# Patient Record
Sex: Female | Born: 1955 | Race: White | Hispanic: No | Marital: Married | Smoking: Never smoker
Health system: Southern US, Community
[De-identification: ages and names within clinical notes are randomized; demographics above are authoritative.]

## PROBLEM LIST (undated history)

## (undated) DIAGNOSIS — D649 Anemia, unspecified: Secondary | ICD-10-CM

## (undated) DIAGNOSIS — D696 Thrombocytopenia, unspecified: Secondary | ICD-10-CM

## (undated) DIAGNOSIS — R000 Tachycardia, unspecified: Secondary | ICD-10-CM

## (undated) DIAGNOSIS — G40909 Epilepsy, unspecified, not intractable, without status epilepticus: Secondary | ICD-10-CM

## (undated) DIAGNOSIS — E785 Hyperlipidemia, unspecified: Secondary | ICD-10-CM

## (undated) DIAGNOSIS — R296 Repeated falls: Secondary | ICD-10-CM

## (undated) DIAGNOSIS — R2681 Unsteadiness on feet: Secondary | ICD-10-CM

## (undated) DIAGNOSIS — R4189 Other symptoms and signs involving cognitive functions and awareness: Secondary | ICD-10-CM

## (undated) DIAGNOSIS — N809 Endometriosis, unspecified: Secondary | ICD-10-CM

## (undated) DIAGNOSIS — I10 Essential (primary) hypertension: Secondary | ICD-10-CM

## (undated) DIAGNOSIS — D219 Benign neoplasm of connective and other soft tissue, unspecified: Secondary | ICD-10-CM

## (undated) DIAGNOSIS — N946 Dysmenorrhea, unspecified: Secondary | ICD-10-CM

## (undated) DIAGNOSIS — F419 Anxiety disorder, unspecified: Secondary | ICD-10-CM

## (undated) DIAGNOSIS — Z789 Other specified health status: Secondary | ICD-10-CM

## (undated) DIAGNOSIS — M810 Age-related osteoporosis without current pathological fracture: Secondary | ICD-10-CM

## (undated) DIAGNOSIS — F32A Depression, unspecified: Secondary | ICD-10-CM

## (undated) DIAGNOSIS — F0150 Vascular dementia without behavioral disturbance: Secondary | ICD-10-CM

## (undated) DIAGNOSIS — R3129 Other microscopic hematuria: Secondary | ICD-10-CM

## (undated) HISTORY — DX: Benign neoplasm of connective and other soft tissue, unspecified: D21.9

## (undated) HISTORY — DX: Essential (primary) hypertension: I10

## (undated) HISTORY — DX: Depression, unspecified: F32.A

## (undated) HISTORY — DX: Thrombocytopenia, unspecified: D69.6

## (undated) HISTORY — DX: Anxiety disorder, unspecified: F41.9

## (undated) HISTORY — DX: Age-related osteoporosis without current pathological fracture: M81.0

## (undated) HISTORY — PX: BREAST BIOPSY: SHX20

## (undated) HISTORY — DX: Endometriosis, unspecified: N80.9

## (undated) HISTORY — DX: Dysmenorrhea, unspecified: N94.6

## (undated) HISTORY — DX: Anemia, unspecified: D64.9

## (undated) HISTORY — DX: Tachycardia, unspecified: R00.0

## (undated) HISTORY — DX: Other microscopic hematuria: R31.29

---

## 1997-05-19 ENCOUNTER — Other Ambulatory Visit: Admission: RE | Admit: 1997-05-19 | Discharge: 1997-05-19 | Payer: Self-pay | Admitting: *Deleted

## 1998-05-04 ENCOUNTER — Other Ambulatory Visit: Admission: RE | Admit: 1998-05-04 | Discharge: 1998-05-04 | Payer: Self-pay | Admitting: *Deleted

## 1999-05-17 ENCOUNTER — Other Ambulatory Visit: Admission: RE | Admit: 1999-05-17 | Discharge: 1999-05-17 | Payer: Self-pay | Admitting: *Deleted

## 1999-10-27 ENCOUNTER — Ambulatory Visit (HOSPITAL_COMMUNITY): Admission: RE | Admit: 1999-10-27 | Discharge: 1999-10-27 | Payer: Self-pay | Admitting: Gastroenterology

## 2000-05-16 ENCOUNTER — Other Ambulatory Visit: Admission: RE | Admit: 2000-05-16 | Discharge: 2000-05-16 | Payer: Self-pay | Admitting: *Deleted

## 2001-05-07 ENCOUNTER — Other Ambulatory Visit: Admission: RE | Admit: 2001-05-07 | Discharge: 2001-05-07 | Payer: Self-pay | Admitting: *Deleted

## 2002-05-13 ENCOUNTER — Other Ambulatory Visit: Admission: RE | Admit: 2002-05-13 | Discharge: 2002-05-13 | Payer: Self-pay | Admitting: *Deleted

## 2003-01-05 ENCOUNTER — Ambulatory Visit (HOSPITAL_BASED_OUTPATIENT_CLINIC_OR_DEPARTMENT_OTHER): Admission: RE | Admit: 2003-01-05 | Discharge: 2003-01-05 | Payer: Self-pay | Admitting: Obstetrics and Gynecology

## 2003-01-05 ENCOUNTER — Ambulatory Visit (HOSPITAL_COMMUNITY): Admission: RE | Admit: 2003-01-05 | Discharge: 2003-01-05 | Payer: Self-pay | Admitting: Obstetrics and Gynecology

## 2003-01-06 ENCOUNTER — Encounter (INDEPENDENT_AMBULATORY_CARE_PROVIDER_SITE_OTHER): Payer: Self-pay | Admitting: Specialist

## 2003-07-19 ENCOUNTER — Other Ambulatory Visit: Admission: RE | Admit: 2003-07-19 | Discharge: 2003-07-19 | Payer: Self-pay | Admitting: Obstetrics and Gynecology

## 2004-06-27 ENCOUNTER — Other Ambulatory Visit: Admission: RE | Admit: 2004-06-27 | Discharge: 2004-06-27 | Payer: Self-pay | Admitting: *Deleted

## 2005-03-26 ENCOUNTER — Ambulatory Visit (HOSPITAL_COMMUNITY): Admission: RE | Admit: 2005-03-26 | Discharge: 2005-03-26 | Payer: Self-pay | Admitting: Obstetrics and Gynecology

## 2005-04-16 ENCOUNTER — Encounter: Admission: RE | Admit: 2005-04-16 | Discharge: 2005-04-16 | Payer: Self-pay | Admitting: Obstetrics and Gynecology

## 2005-04-20 ENCOUNTER — Encounter (INDEPENDENT_AMBULATORY_CARE_PROVIDER_SITE_OTHER): Payer: Self-pay | Admitting: *Deleted

## 2005-04-20 ENCOUNTER — Encounter: Admission: RE | Admit: 2005-04-20 | Discharge: 2005-04-20 | Payer: Self-pay | Admitting: Obstetrics and Gynecology

## 2005-06-28 ENCOUNTER — Other Ambulatory Visit: Admission: RE | Admit: 2005-06-28 | Discharge: 2005-06-28 | Payer: Self-pay | Admitting: Obstetrics & Gynecology

## 2005-07-06 ENCOUNTER — Encounter: Admission: RE | Admit: 2005-07-06 | Discharge: 2005-07-06 | Payer: Self-pay | Admitting: Obstetrics & Gynecology

## 2005-08-16 ENCOUNTER — Encounter: Admission: RE | Admit: 2005-08-16 | Discharge: 2005-08-16 | Payer: Self-pay | Admitting: Internal Medicine

## 2005-08-16 IMAGING — CT CT CHEST WO/W CM
2 of 7 series · 15 of 36 positions shown, 19 images · IV contrast (75ML OMNI 300)
Comparison: none

CLINICAL DATA: Left sided chest pain.
CHEST CT WITHOUT AND WITH CONTRAST:
TECHNIQUE: Multidetector CT imaging of the chest was performed following the standard protocol before and during bolus administration of intravenous contrast.
Contrast:  75 cc of Omnipaque 300.

[Series 5: recon 3: chest w/ · axial · 0.62mm/px · z∈[-295,-28]mm · 14 of 241 slices shown, 18 images]
[im 14/241  mediastinal]
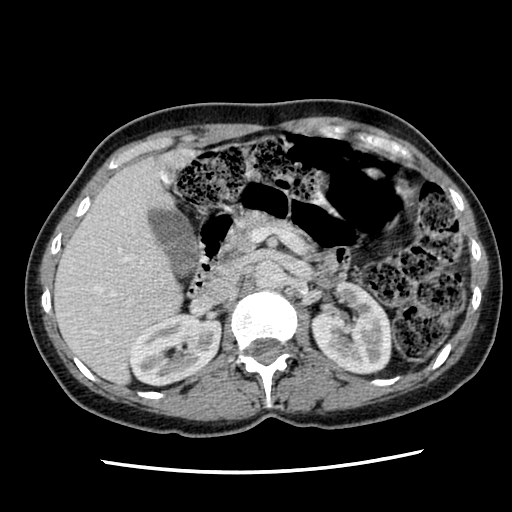
[im 14/241  lung]
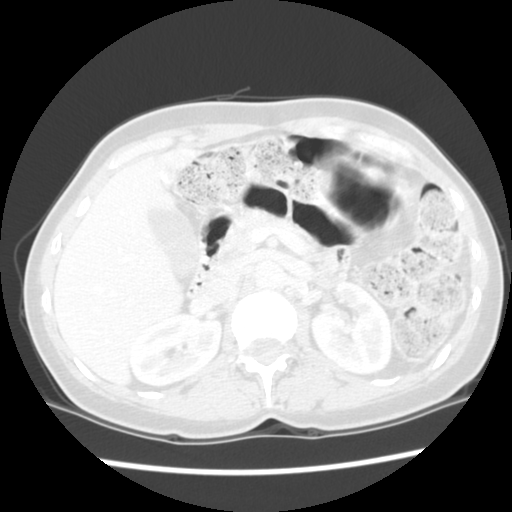
[im 27/241  lung]
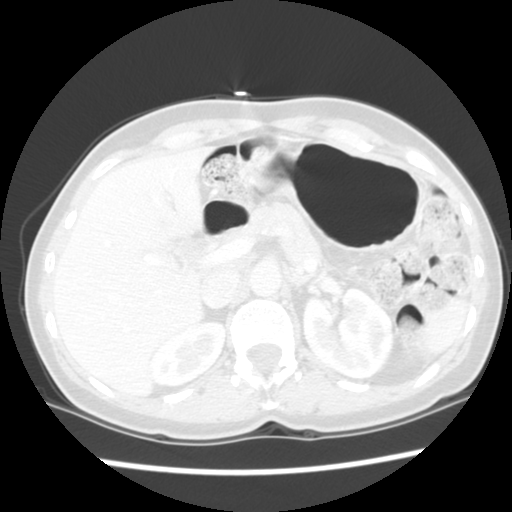
[im 54/241  lung]
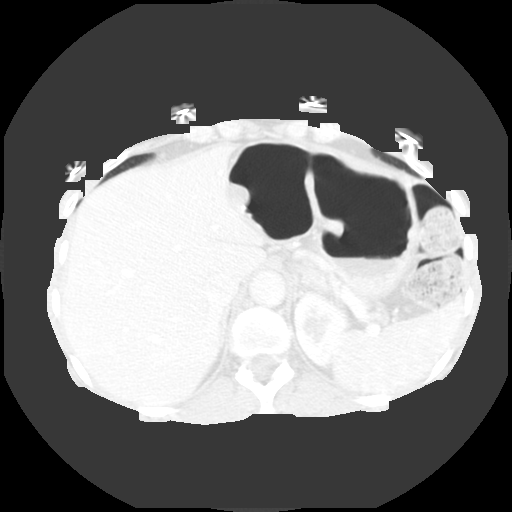
[im 67/241  lung]
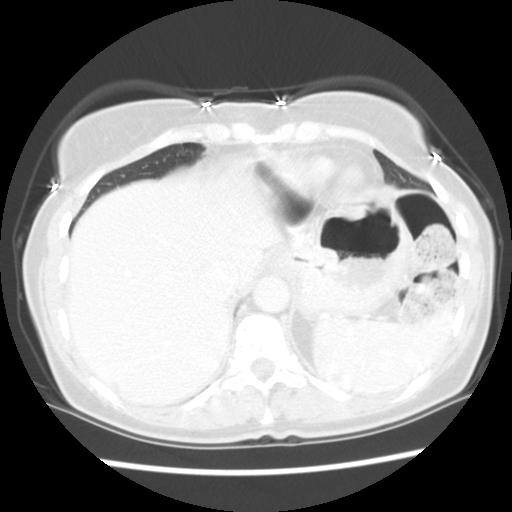
[im 81/241  mediastinal]
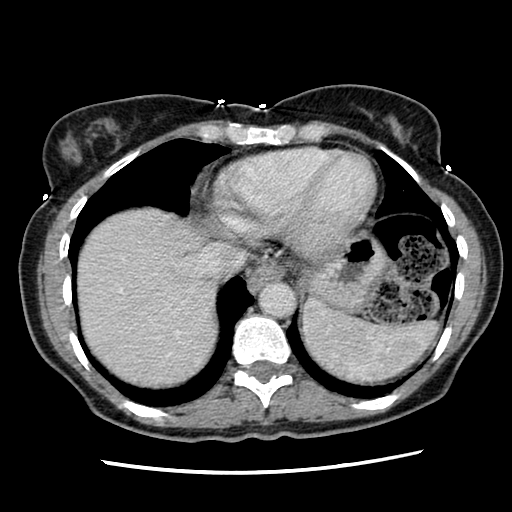
[im 81/241  lung]
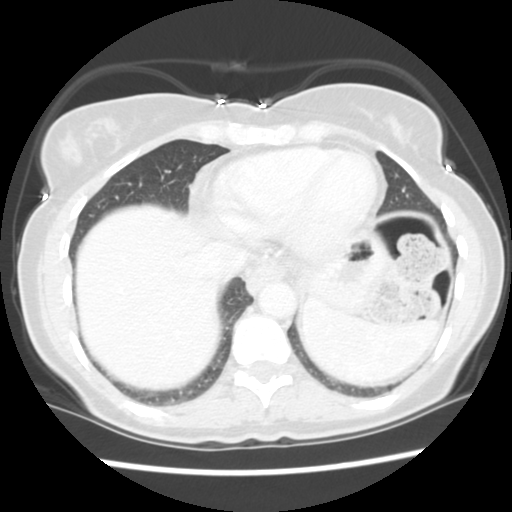
[im 94/241  lung]
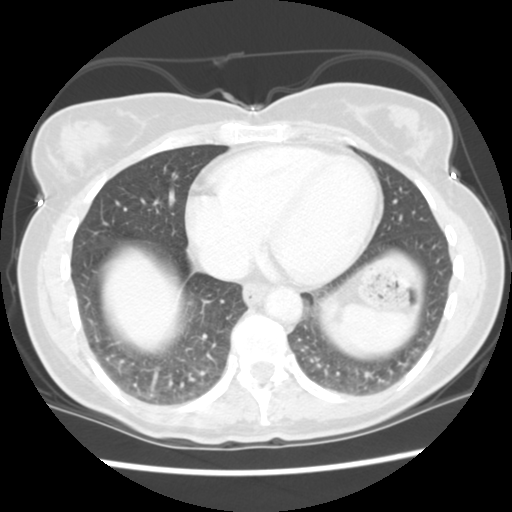
[im 107/241  lung]
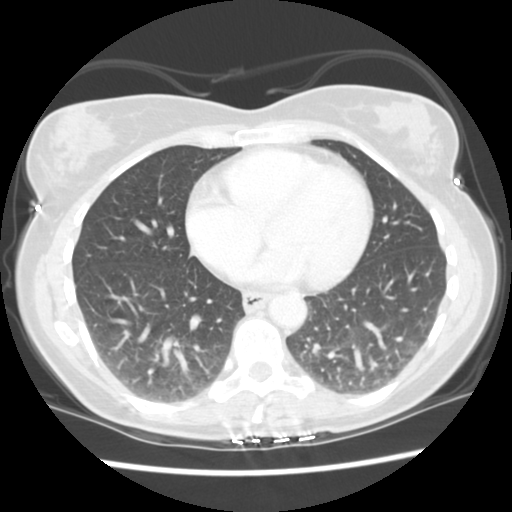
[im 134/241  lung]
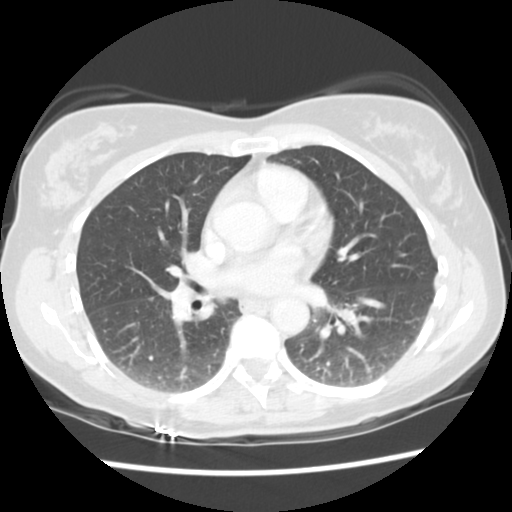
[im 147/241  mediastinal]
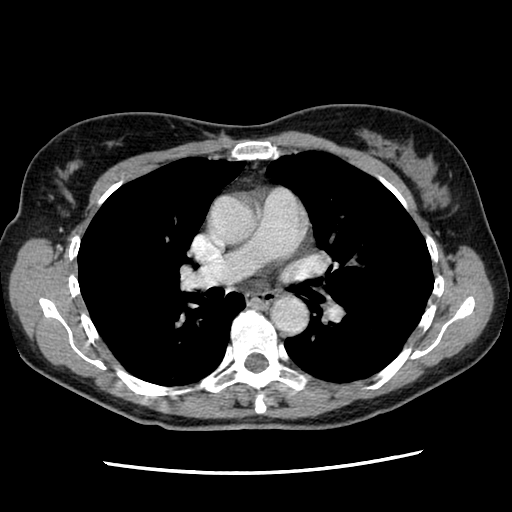
[im 147/241  lung]
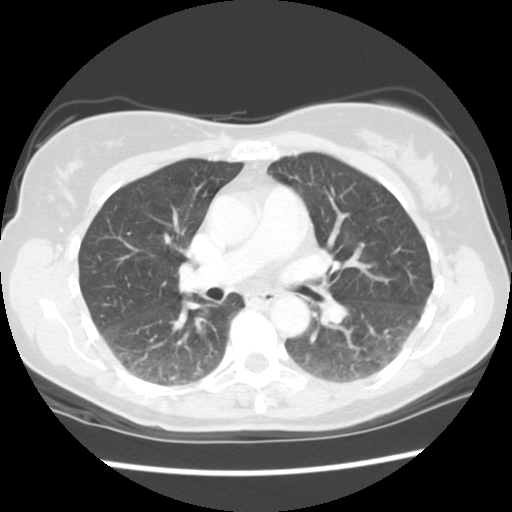
[im 161/241  lung]
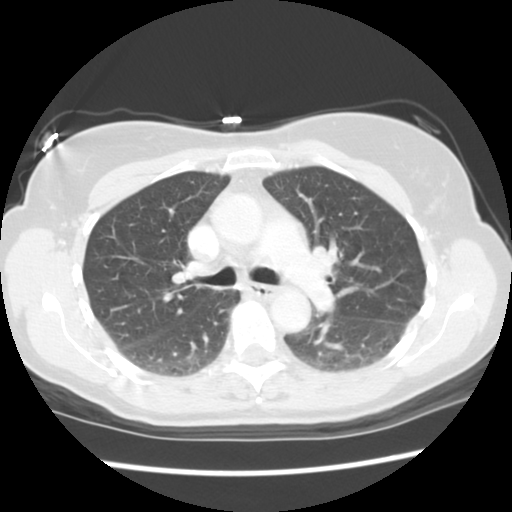
[im 174/241  lung]
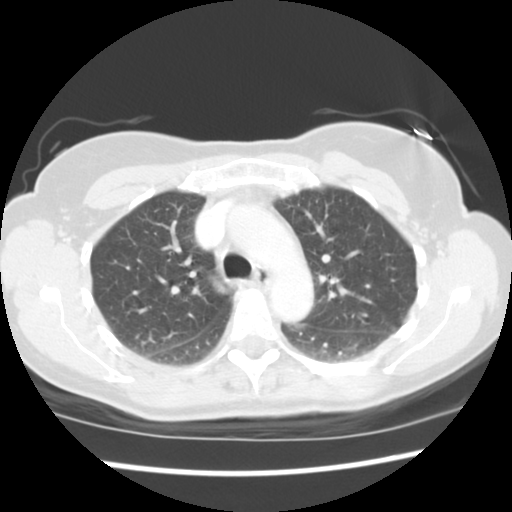
[im 187/241  lung]
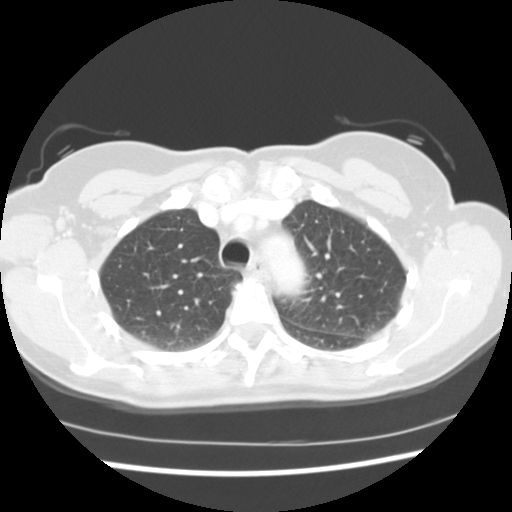
[im 214/241  mediastinal]
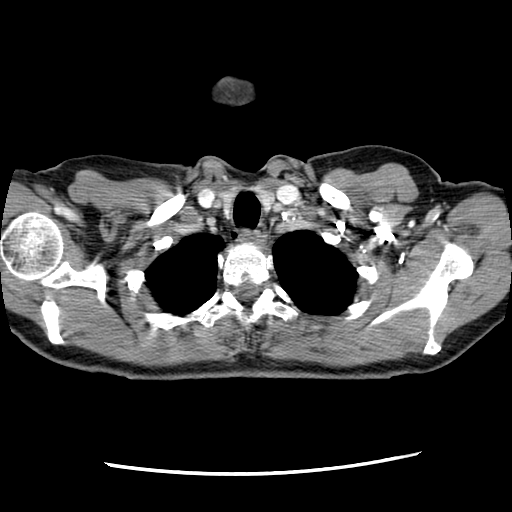
[im 214/241  lung]
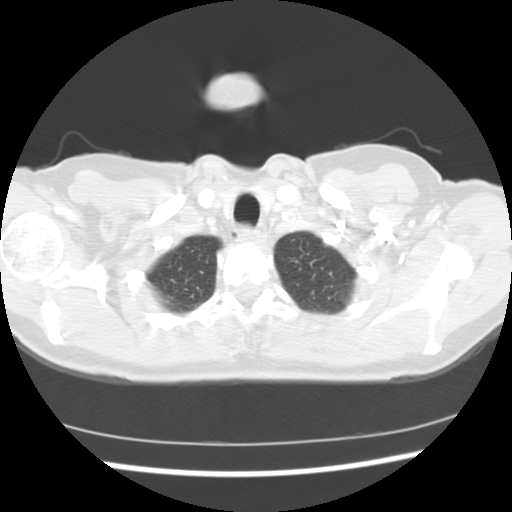
[im 227/241  lung]
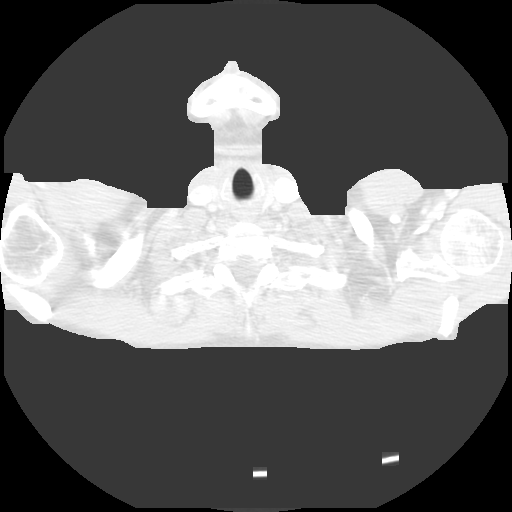

[Series 244: reformatted · coronal · 0.62mm/px · 1 of 90 slices shown]
[im 45/90  lung]
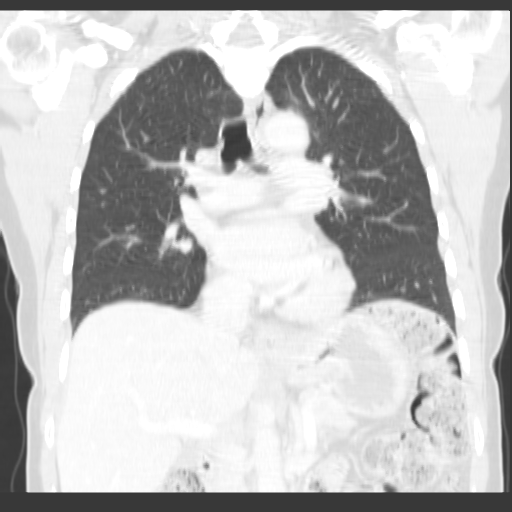

[15 of 36 positions shown; findings below may reference images not displayed]

FINDINGS: No mass is identified along the left chest wall.  No rib fracture is identified.  The imaged muscles appear normal.  The patient does not have a pleural or pericardial effusion.  There is no hilar, mediastinal, or axillary lymphadenopathy.  Heart and great vessels appear normal.  The lungs demonstrate some minimal dependent atelectatic change, but there is no pulmonary nodule, mass, or area of  consolidation.  
  Incidental imaging of the upper abdomen demonstrates a small lesion of the periphery of the right lobe of liver with peripheral nodular enhancement consistent with a hemangioma.  A second, too small to characterize, low attenuating lesion in the left lobe of the liver on image [DATE] represent a tiny hemangioma or possibly a cyst.  Upper abdomen is otherwise unremarkable.
IMPRESSION: 1.  No findings to explain the patient's pain.  No acute disease.
2.  Small hemangioma in the liver is noted.

## 2006-07-08 ENCOUNTER — Other Ambulatory Visit: Admission: RE | Admit: 2006-07-08 | Discharge: 2006-07-08 | Payer: Self-pay | Admitting: Obstetrics & Gynecology

## 2007-07-10 ENCOUNTER — Other Ambulatory Visit: Admission: RE | Admit: 2007-07-10 | Discharge: 2007-07-10 | Payer: Self-pay | Admitting: Obstetrics and Gynecology

## 2008-06-04 ENCOUNTER — Encounter: Admission: RE | Admit: 2008-06-04 | Discharge: 2008-06-04 | Payer: Self-pay | Admitting: Obstetrics & Gynecology

## 2010-01-22 ENCOUNTER — Encounter: Payer: Self-pay | Admitting: Obstetrics and Gynecology

## 2010-05-19 NOTE — Op Note (Signed)
NAMENYX, KEADY                         ACCOUNT NO.:  [id number]   MEDICAL RECORD NO.:  [id number]                   PATIENT TYPE:  AMB   LOCATION:  [facility]                                 FACILITY:  [facility]   PHYSICIAN:  Laqueta Linden, M.D.                 DATE OF BIRTH:  1955/07/22   DATE OF PROCEDURE:  01/05/2003  DATE OF DISCHARGE:                                 OPERATIVE REPORT   PREOPERATIVE DIAGNOSES:  1. Submucosal fibroid.  2. Menorrhagia.   POSTOPERATIVE DIAGNOSES:  1. Submucosal fibroid.  2. Menorrhagia.   PROCEDURE:  1. Hysteroscopic resection of the submucosal fibroid.  2. HTA.   SURGEON:  Laqueta Linden, M.D.   ANESTHESIA:  General LMA.   ESTIMATED BLOOD LOSS:  Less than 20 mL   SORBITOL NET INTAKE:  Less than 100 mL   SPECIMENS:  Sent to pathology.   COMPLICATIONS:  None.   INDICATIONS FOR PROCEDURE:  Jacqueline Bonilla is a 55 year old female who has  had significant and worsening menorrhagia over the past several years with  associated perimenopausal symptoms.  She is not a candidate for hormonal  management as she has had visual disturbances.  She underwent pelvic  ultrasound with sonohysterogram which confirmed the presence of a posterior  1.2 x 1.1 cm intracavitary fibroid as well as a 5 x 4 mm fundal fibroid.  She had a couple of other small intramural fibroids noted as well.  She had  endometrial sampling which revealed a benign endometrial polyp of the  hyperplastic variety but no malignancy.  It was felt that resection of the  small submucosal fibroid would be inadequate management and it was felt that  she would be an appropriate candidate for HTA.  She therefore received  preoperative Lupron treatment one month prior to surgery and presents now  for surgery. She has seen the operative hysteroscopy film, voiced her  understanding and acceptance of the risks, benefits, limitations,  alternatives, complications and possible incomplete or  nondefinitive relief  of her symptoms and agrees to proceed. Full consent was given.  She received  Toradol 30 mg IV on call to the OR and received another 30 mg IM  intraoperatively.   DESCRIPTION OF PROCEDURE:  The patient was taken to the operating room and  after proper identification and consents were ascertained, she was placed on  the operating table in the supine position.  After the injection of general  LMA, she was placed in the [place] stirrups and the perineum and vagina were  prepped and draped in a routine sterile fashion.  The bladder was then  emptied with a red rubber catheter.  The uterus was noted to be anterior,  parous in size, mobile.  The speculum was placed and the anterior lip of the  cervix grasped with a single tooth tenaculum.  The internal os was gently  dilated to a #21 Pratt dilator.  At this point, the HTA scope was inserted  into the cavity.  Both tubal ostia were visualized. The cavity appeared to  be atrophic consistent with the patient's Lupron.  However, the known  submucosal fibroid actually appeared to be more on the order of 2 cm and was  emanating from the posterior lower uterine segment and effectively blocked  access to the upper portion of the uterus.  It was felt that this needed to  be resected prior to HTA in order to allow for adequate circulation of the  fluid. For this reason, the HTA scope was withdrawn and the cervix was  dilated up to a #33 Pratt dilator. The resectoscope was then inserted.  Some  initial difficulty was encountered with resection with problems eventually  determined to be from the cautery machine. This was switched out and the  double loop was then used on routine settings to easily resect the fibroid.  All tissue pieces were evacuated. There was no bleeding noted whatsoever  again consistent with the patient's previous Lupron.  At this point, it was  felt that the endometrial cavity was amendable to the HTA procedure.   The  scope was then withdrawn.  The HTA scope was then reinserted and a  __________ tenaculum was placed circumferentially around the cervix with  what was felt to be an excellent seal.  This seal was tested and there was  no visual or reservoir evidence of any leakage of the fluid.  Once the field  was established then the HTA rule out commenced in a routine fashion with  the initial pressure track followed by the heating of the fluid followed by  a 10 minute ablation procedure followed by the cool down. At no time was  there any drop in the reservoir nor was there any visualized fluid leakage  into the upper vagina.  At the conclusion of the procedure, the scope was  then withdrawn.  The tenaculum was removed.  The cervix was somewhat raw in  appearance but there was no active bleeding noted.  The patient was stable  and awake on transfer to the PACU. She will be observed and discharged per  anesthesia protocol. She is to followup in the office in 2-3 weeks time or  sooner for excessive pain, fever, bleeding or other concerns. She was given  a prescription for Vicodin ES, dispensed 10, 1-2 q. 4-6h p.r.n. excessive  cramping with one refill. Also take to take Advil or Aleve as tolerated as  well as her routine medications.                                               Laqueta Linden, M.D.    LKS/MEDQ  D:  01/05/2003  T:  01/05/2003  Job:  [id number]

## 2010-05-19 NOTE — Procedures (Signed)
[facility]  Patient:    Jacqueline Bonilla, Jacqueline Bonilla                     MRN: [id number] Proc. Date: 10/27/99 Attending:  Anselmo Rod, M.D. CC:         Andres Ege, M.D.   Procedure Report  DATE OF BIRTH:  May 24, 1954  PROCEDURE PERFORMED:  Colonoscopy.  ENDOSCOPIST:  Anselmo Rod, M.D.  INSTRUMENT:  Olympus video colonoscope.  INDICATION FOR PROCEDURE:  Rectal bleeding in a 55 year old white female with a change in bowel habits, more constipation in the recent past.  Rule out colonic polyps, masses, hemorrhoids, etc.  PREPROCEDURE PREPARATION:  Informed consent was procured from the patient. The patient was fasting for eight hours prior to the procedure and prepped with a bottle of magnesium citrate and a gallon of NuLytely the night prior to the procedure.  PREPROCEDURE PHYSICAL:  VITAL SIGNS:  Stable.  NECK:  Supple.  CHEST:  Clear to auscultation.  HEART:  S1, S2 regular.  ABDOMEN:  Soft with normal abdominal bowel sounds.  DESCRIPTION OF THE PROCEDURE:  The patient was placed in the left lateral decubitus position and sedated with 50 mg of Demerol and 5 mg of Versed intravenously.  Once the patient was adequately sedated and maintained on low-flow oxygen and continuous cardiac monitoring, the Olympus video colonoscope was advanced from the rectum to the cecum without difficulty except for small, nonbleeding internal hemorrhoids.  No other abnormalities were seen, no masses, polyps, erosions, or diverticula were present.  IMPRESSION:  Normal colonoscopy up to the cecum except for small, nonbleeding internal hemorrhoid.  RECOMMENDATIONS: 1. The patient has been advised to increase fluid and fiber in her diet.  She    seems to note significant change when she increases fiber in her diet. 2. Outpatient followup is advised on a p.r.n. basis.  She is to contact the    office if she has any abnormal GI symptoms. DD:   10/27/99 TD:  10/27/99 Job: [id number]

## 2012-07-29 ENCOUNTER — Encounter: Payer: Self-pay | Admitting: Certified Nurse Midwife

## 2012-07-30 ENCOUNTER — Encounter: Payer: Self-pay | Admitting: Certified Nurse Midwife

## 2012-07-30 ENCOUNTER — Ambulatory Visit (INDEPENDENT_AMBULATORY_CARE_PROVIDER_SITE_OTHER): Payer: BC Managed Care – PPO | Admitting: Certified Nurse Midwife

## 2012-07-30 VITALS — BP 102/62 | HR 64 | Resp 16 | Ht 64.25 in | Wt 133.0 lb

## 2012-07-30 DIAGNOSIS — R5383 Other fatigue: Secondary | ICD-10-CM

## 2012-07-30 DIAGNOSIS — Z01419 Encounter for gynecological examination (general) (routine) without abnormal findings: Secondary | ICD-10-CM

## 2012-07-30 DIAGNOSIS — Z0000 Encounter for general adult medical examination without abnormal findings: Secondary | ICD-10-CM

## 2012-07-30 DIAGNOSIS — R5381 Other malaise: Secondary | ICD-10-CM

## 2012-07-30 DIAGNOSIS — E559 Vitamin D deficiency, unspecified: Secondary | ICD-10-CM

## 2012-07-30 LAB — LIPID PANEL: LDL Cholesterol: 103 mg/dL — ABNORMAL HIGH (ref 0–99)

## 2012-07-30 LAB — COMPREHENSIVE METABOLIC PANEL
ALT: 15 U/L (ref 0–35)
AST: 22 U/L (ref 0–37)
Albumin: 4.1 g/dL (ref 3.5–5.2)
BUN: 14 mg/dL (ref 6–23)
Calcium: 9.2 mg/dL (ref 8.4–10.5)
Chloride: 103 mEq/L (ref 96–112)
Creat: 0.77 mg/dL (ref 0.50–1.10)
Glucose, Bld: 82 mg/dL (ref 70–99)
Potassium: 3.6 mEq/L (ref 3.5–5.3)

## 2012-07-30 LAB — POCT URINALYSIS DIPSTICK
Ketones, UA: NEGATIVE
Nitrite, UA: NEGATIVE
Protein, UA: NEGATIVE
Urobilinogen, UA: NEGATIVE
pH, UA: 5

## 2012-07-30 LAB — CBC WITH DIFFERENTIAL/PLATELET
Basophils Relative: 1 % (ref 0–1)
HCT: 37.4 % (ref 36.0–46.0)
Hemoglobin: 12.7 g/dL (ref 12.0–15.0)
Lymphocytes Relative: 26 % (ref 12–46)
Lymphs Abs: 1.1 10*3/uL (ref 0.7–4.0)
Monocytes Absolute: 0.4 10*3/uL (ref 0.1–1.0)
Neutro Abs: 2.8 10*3/uL (ref 1.7–7.7)
WBC: 4.4 10*3/uL (ref 4.0–10.5)

## 2012-07-30 LAB — FERRITIN: Ferritin: 21 ng/mL (ref 10–291)

## 2012-07-30 LAB — VITAMIN B12: Vitamin B-12: 757 pg/mL (ref 211–911)

## 2012-07-30 LAB — IBC PANEL: TIBC: 358 ug/dL (ref 250–470)

## 2012-07-30 LAB — IRON: Iron: 82 ug/dL (ref 42–145)

## 2012-07-30 NOTE — Progress Notes (Signed)
57 y.o. G41P1001 Married Caucasian Fe here for annual exam. Menopausal no HRT, denies vaginal bleeding. Having vaginal dryness but not treating, not sexually active. Sees PCP for aex prn. Continues to have fatigue with essentially good diet. Sleeps OK. Has taken vacation this year!  Works long days. Feels fatigue is worse this year.  No other health issues. Declines depression feelings or anxiety.  Patient's last menstrual period was 01/02/2003.          Sexually active: no  The current method of family planning is vasectomy.    Exercising: yes  walking Smoker:  no  Health Maintenance: Pap: 07-25-11 neg HPV HR neg MMG:  2010 neg. Declines mammogram, "her choice" Colonoscopy:  2013 diverticuli, repeat 5 years BMD:   2010 Osteoporosis,  Declines scheduling TDaP:  07-25-11 Labs: Poct urine-neg, Hgb-12.1 Self breast exam: done monthly   reports that she has never smoked. She does not have any smokeless tobacco history on file. She reports that she does not drink alcohol or use illicit drugs.  Past Medical History  Diagnosis Date  . Endometriosis   . Dysmenorrhea   . Microhematuria     negative workup  . Osteoporosis   . Fibroid     Past Surgical History  Procedure Laterality Date  . Breast biopsy    . Hysteroscopic resection      Current Outpatient Prescriptions  Medication Sig Dispense Refill  . Ascorbic Acid (VITAMIN C PO) Take by mouth daily.      . B Complex Vitamins (B COMPLEX PO) Take by mouth daily.      . COD LIVER OIL PO Take by mouth as needed.       . Coenzyme Q10 (COQ10 PO) Take by mouth as needed.       Marland Kitchen MAGNESIUM PO Take by mouth as needed.       . Probiotic Product (PROBIOTIC PO) Take by mouth daily.      Marland Kitchen SARAFEM 10 MG TABS as needed.      . Vitamin D, Ergocalciferol, (DRISDOL) 50000 UNITS CAPS Take 50,000 Units by mouth every 14 (fourteen) days.       No current facility-administered medications for this visit.    History reviewed. No pertinent family  history.  ROS:  Pertinent items are noted in HPI.  Otherwise, a comprehensive ROS was negative.  Exam:   BP 102/62  Pulse 64  Resp 16  Ht 5' 4.25" (1.632 m)  Wt 133 lb (60.328 kg)  BMI 22.65 kg/m2  LMP 01/02/2003 Height: 5' 4.25" (163.2 cm)  Ht Readings from Last 3 Encounters:  07/30/12 5' 4.25" (1.632 m)    General appearance: alert, cooperative and appears stated age Head: Normocephalic, without obvious abnormality, atraumatic Neck: no adenopathy, supple, symmetrical, trachea midline and thyroid normal to inspection and palpation Lungs: clear to auscultation bilaterally Breasts: normal appearance, no masses or tenderness, No nipple retraction or dimpling, No nipple discharge or bleeding, No axillary or supraclavicular adenopathy Heart: regular rate and rhythm Abdomen: soft, non-tender; no masses,  no organomegaly Extremities: extremities normal, atraumatic, no cyanosis or edema Skin: Skin color, texture, turgor normal. No rashes or lesions Lymph nodes: Cervical, supraclavicular, and axillary nodes normal. No abnormal inguinal nodes palpated Neurologic: Grossly normal   Pelvic: External genitalia:  no lesions              Urethra:  normal appearing urethra with no masses, tenderness or lesions  Bartholin's and Skene's: normal                 Vagina: normal appearing vagina with normal color and discharge, no lesions              Cervix: normal, non tender              Pap taken: no Bimanual Exam:  Uterus:  normal size, contour, position, consistency, mobility, non-tender and retroverted              Adnexa: normal adnexa and no mass, fullness, tenderness               Rectovaginal: Confirms               Anus:  normal sphincter tone, no lesions  A:  Well Woman with normal exam  Menopausal no HRT  Fatigue increase  Osteoporosis self treatment with diet/exercise/calcium/Vitamin D  P:   Reviewed health and wellness pertinent to exam  Aware of need to evaluate,  if vaginal bleeding  Discussed sometimes depression/anxiety can lead to fatigue, but does not feel this is the case. Discussed time for self, and support from family.  Discussed evaluation with PCP if no change  Labs:Lipid panel, CMT, CBC with diff, IBC panel, Iron,Ferritin, TSH, VIt.B 12, Vit. D  Recommended repeat BMD, patient declines, would choose not use medications.  Pap smear as per guidelines   Mammogram yearly recommended, declines scheduling or having it done. Aware of benefits. pap smear not taken today  counseled on breast self exam, mammography screening, menopause, osteoporosis, adequate intake of calcium and vitamin D, diet and exercise  return annually or prn  An After Visit Summary was printed and given to the patient.

## 2012-07-30 NOTE — Patient Instructions (Addendum)

## 2012-07-31 LAB — VITAMIN D 25 HYDROXY (VIT D DEFICIENCY, FRACTURES): Vit D, 25-Hydroxy: 65 ng/mL (ref 30–89)

## 2012-07-31 NOTE — Progress Notes (Signed)
Note reviewed, agree with plan.  Chriss Mannan, MD  

## 2012-08-13 ENCOUNTER — Other Ambulatory Visit: Payer: Self-pay | Admitting: Certified Nurse Midwife

## 2012-08-13 NOTE — Telephone Encounter (Signed)
eScribe request for refill on SARAFEM Last filled - 07/31/11, #30 X 6  eScribe request for refill on VITAMIN D  Last filled - ? Vitamin D last checked on 07/30/12, normal. Last AEX - 07/30/12 Next AEX - 08/04/13   Please advise refills on Sarafem and Vitamin D.

## 2012-08-14 NOTE — Telephone Encounter (Signed)
Sarafem RX sent.  Vitamin D denied.  Message left for patient to return call.

## 2012-08-14 NOTE — Telephone Encounter (Signed)
Ok to refill Sarafem, but no refill on VIt D.due to normal She does not need Rx. She can take 1000IU daily OTC

## 2012-08-15 NOTE — Telephone Encounter (Signed)
I spoke with patient.  She is agreeable with OTC Vitamin D.  Also verified we sent in Sarafem tablets.

## 2012-08-15 NOTE — Telephone Encounter (Signed)
Patient is returning a call. She received a message yesterday asking her to call our office. She is not sure who called. She believes it may have to do with her prescription request. No outgoing phone call documented.

## 2012-08-18 ENCOUNTER — Telehealth: Payer: Self-pay | Admitting: Certified Nurse Midwife

## 2012-08-18 NOTE — Telephone Encounter (Signed)
LMTCB  aa 

## 2012-08-18 NOTE — Telephone Encounter (Signed)
Patient is wanting to have their BMI form filled out. Please call her. And let her know what she needs to do , fax e-mail. Drop off form.

## 2012-08-19 NOTE — Telephone Encounter (Signed)
Spoke with pt about BMI form. Pt has access to fax machine. Fax # given. Pt appreciative.

## 2012-11-06 ENCOUNTER — Other Ambulatory Visit: Payer: Self-pay

## 2013-08-04 ENCOUNTER — Ambulatory Visit (INDEPENDENT_AMBULATORY_CARE_PROVIDER_SITE_OTHER): Payer: BC Managed Care – PPO | Admitting: Certified Nurse Midwife

## 2013-08-04 ENCOUNTER — Encounter: Payer: Self-pay | Admitting: Certified Nurse Midwife

## 2013-08-04 VITALS — BP 126/78 | HR 60 | Resp 16 | Ht 64.0 in | Wt 136.4 lb

## 2013-08-04 DIAGNOSIS — Z01419 Encounter for gynecological examination (general) (routine) without abnormal findings: Secondary | ICD-10-CM

## 2013-08-04 DIAGNOSIS — Z124 Encounter for screening for malignant neoplasm of cervix: Secondary | ICD-10-CM

## 2013-08-04 DIAGNOSIS — Z0000 Encounter for general adult medical examination without abnormal findings: Secondary | ICD-10-CM

## 2013-08-04 LAB — POCT URINALYSIS DIPSTICK
Bilirubin, UA: NEGATIVE
Blood, UA: NEGATIVE
Glucose, UA: NEGATIVE
Ketones, UA: NEGATIVE
LEUKOCYTES UA: NEGATIVE
Nitrite, UA: NEGATIVE
PH UA: 5
Protein, UA: NEGATIVE
Urobilinogen, UA: NEGATIVE

## 2013-08-04 LAB — CBC
HEMATOCRIT: 37.9 % (ref 36.0–46.0)
HEMOGLOBIN: 12.8 g/dL (ref 12.0–15.0)
MCH: 30.2 pg (ref 26.0–34.0)
MCHC: 33.8 g/dL (ref 30.0–36.0)
MCV: 89.4 fL (ref 78.0–100.0)
Platelets: 116 10*3/uL — ABNORMAL LOW (ref 150–400)
RBC: 4.24 MIL/uL (ref 3.87–5.11)
RDW: 13.6 % (ref 11.5–15.5)
WBC: 5.7 10*3/uL (ref 4.0–10.5)

## 2013-08-04 LAB — HEMOGLOBIN, FINGERSTICK: HEMOGLOBIN, FINGERSTICK: 12.4 g/dL (ref 12.0–16.0)

## 2013-08-04 NOTE — Progress Notes (Signed)
58 y.o. G47P1001 Married Caucasian Fe here for annual exam. Menopausal no HRT. Patient denies vaginal bleeding or vaginal dryness. Not sexually active. "very stressful year since 02/08/22". Parents moved here from [place], Dad has Leukemia, but stable(both parents in 4's), sister contracted flu in [place]02[place]08 [place] and died 2/99 from complications of the flu. Son recently married and new grandson due in a month! "Not sure whether to laugh or cry". Patient has Sarafem she uses as needed and did use after sister's passing( she was the youngest of 87). Patient is sleeping better now. Not interested in grief counseling or support at this point. No thoughts of hurting self or others. "Just ready for the joy of a first grandchild". No health issues other than headaches, but not severe enough for any medication use. Aware eyes are changing and has scheduled eye exam. Sees PCP prn only. No other health issues today. Would like screening labs.  Patient's last menstrual period was 01/02/2003.          Sexually active: No.  The current method of family planning is vasectomy.    Exercising: Yes.    walk Smoker:  no  Health Maintenance: Pap: 07-25-11 neg HPV HR neg MMG:  2010 neg, pt declines mammogram ever Colonoscopy:  2013 diverticul, f/u 5 yrs BMD:   2010, declines scheduling TDaP:  07-25-11 Labs: HGB-12.4, URINE-negative Self breast exam:   reports that she has never smoked. She does not have any smokeless tobacco history on file. She reports that she does not drink alcohol or use illicit drugs.  Past Medical History  Diagnosis Date  . Endometriosis   . Dysmenorrhea   . Microhematuria     negative workup  . Osteoporosis   . Fibroid     Past Surgical History  Procedure Laterality Date  . Breast biopsy    . Hysteroscopic resection    . Cesarean section      Current Outpatient Prescriptions  Medication Sig Dispense Refill  . Ascorbic Acid (VITAMIN C PO) Take by mouth daily.      . B Complex  Vitamins (B COMPLEX PO) Take by mouth daily.      . COD LIVER OIL PO Take by mouth as needed.       . Coenzyme Q10 (COQ10 PO) Take by mouth as needed.       Marland Kitchen MAGNESIUM PO Take by mouth as needed.       . Probiotic Product (PROBIOTIC PO) Take by mouth daily.      Marland Kitchen SARAFEM 10 MG TABS take 1 tablet by mouth once daily  30 tablet  6   No current facility-administered medications for this visit.    Family History  Problem Relation Age of Onset  . Cancer Father     non hodgkin lymphoma & skin  . Heart attack Maternal Grandfather     ROS:  Pertinent items are noted in HPI.  Otherwise, a comprehensive ROS was negative.  Exam:   LMP 01/02/2003    Ht Readings from Last 3 Encounters:  07/30/12 5' 4.25" (1.632 m)    General appearance: alert, cooperative and appears stated age Head: Normocephalic, without obvious abnormality, atraumatic Neck: no adenopathy, supple, symmetrical, trachea midline and thyroid normal to inspection and palpation and non-palpable Lungs: clear to auscultation bilaterally Breasts: normal appearance, no masses or tenderness, No nipple retraction or dimpling, No nipple discharge or bleeding, No axillary or supraclavicular adenopathy Heart: regular rate and rhythm Abdomen: soft, non-tender; no masses,  no organomegaly Extremities: extremities normal, atraumatic, no cyanosis or edema Skin: Skin color, texture, turgor normal. No rashes or lesions Lymph nodes: Cervical, supraclavicular, and axillary nodes normal. No abnormal inguinal nodes palpated Neurologic: Grossly normal   Pelvic: External genitalia:  no lesions              Urethra:  normal appearing urethra with no masses, tenderness or lesions              Bartholin's and Skene's: normal                 Vagina: normal appearing vagina with normal color and discharge, no lesions              Cervix: atrophic appearance, bleeding with pap only              Pap taken: Yes.   Bimanual Exam:  Uterus:  normal  size, contour, position, consistency, mobility, non-tender and mid position              Adnexa: normal adnexa and no mass, fullness, tenderness               Rectovaginal: Confirms               Anus:  normal sphincter tone, no lesions  A:  Well Woman with normal exam  Menopausal no HRT  Social stress with sister's death and aging parents  Screening labs  P:   Reviewed health and wellness pertinent to exam  Aware of need to evaluate if vaginal bleeding  Discussed seeking church,family,friend support and reminded Hospice Grief counseling available. Will advise if headaches increase or seek evaluation for.  Pap smear taken today with HPV reflex   counseled on breast self exam, mammography screening encouraged and discussed 3 D use for evaluation if she only chooses to have one, adequate intake of calcium and vitamin D, diet and exercise  return annually or prn  An After Visit Summary was printed and given to the patient.

## 2013-08-04 NOTE — Patient Instructions (Signed)

## 2013-08-05 LAB — COMPREHENSIVE METABOLIC PANEL
ALT: 13 U/L (ref 0–35)
AST: 20 U/L (ref 0–37)
Albumin: 3.8 g/dL (ref 3.5–5.2)
Alkaline Phosphatase: 45 U/L (ref 39–117)
BUN: 14 mg/dL (ref 6–23)
CHLORIDE: 106 meq/L (ref 96–112)
CO2: 28 mEq/L (ref 19–32)
Calcium: 8.9 mg/dL (ref 8.4–10.5)
Creat: 0.74 mg/dL (ref 0.50–1.10)
GLUCOSE: 91 mg/dL (ref 70–99)
POTASSIUM: 4 meq/L (ref 3.5–5.3)
SODIUM: 141 meq/L (ref 135–145)
Total Bilirubin: 0.5 mg/dL (ref 0.2–1.2)
Total Protein: 6.5 g/dL (ref 6.0–8.3)

## 2013-08-05 LAB — LIPID PANEL
CHOLESTEROL: 176 mg/dL (ref 0–200)
HDL: 53 mg/dL (ref >39)
LDL CALC: 107 mg/dL — AB (ref 0–99)
TRIGLYCERIDES: 82 mg/dL (ref <150)
Total CHOL/HDL Ratio: 3.3 Ratio
VLDL: 16 mg/dL (ref 0–40)

## 2013-08-05 LAB — HEMOGLOBIN A1C
HEMOGLOBIN A1C: 5.9 % — AB (ref <5.7)
MEAN PLASMA GLUCOSE: 123 mg/dL — AB (ref <117)

## 2013-08-05 LAB — THYROID PANEL WITH TSH
Free Thyroxine Index: 2.5 (ref 1.0–3.9)
T3 UPTAKE: 36.4 % (ref 22.5–37.0)
T4 TOTAL: 7 ug/dL (ref 5.0–12.5)
TSH: 1.112 u[IU]/mL (ref 0.350–4.500)

## 2013-08-05 LAB — VITAMIN D 25 HYDROXY (VIT D DEFICIENCY, FRACTURES): VIT D 25 HYDROXY: 45 ng/mL (ref 30–89)

## 2013-08-05 NOTE — Progress Notes (Signed)
Reviewed personally.  M. Suzanne Ronnesha Mester, MD.  

## 2013-08-06 LAB — IPS PAP TEST WITH REFLEX TO HPV

## 2013-08-07 ENCOUNTER — Telehealth: Payer: Self-pay

## 2013-08-07 DIAGNOSIS — Z09 Encounter for follow-up examination after completed treatment for conditions other than malignant neoplasm: Secondary | ICD-10-CM

## 2013-08-07 NOTE — Telephone Encounter (Signed)
Spoke with patient. Results given as seen below. Patient is agreeable. Patient states that she does not have primary care but does not want a referral. "To be honest I don't think I want one. It is good to know a few things are off. I will have to think about it." Advised follow up is recommended. Patient is agreeable but states that she does not want follow up at this time. Advised would let Regina Eck CNM know and if she would like referral to call back and we would be happy to place it for her.   Routing to provider for final review. Patient agreeable to disposition. Will close encounter

## 2013-08-07 NOTE — Telephone Encounter (Signed)
Message copied by Jasmine Awe on Fri Aug 07, 2013  9:44 AM ------      Message from: Regina Eck      Created: Thu Aug 06, 2013 12:14 PM       Notify patient that here liver, kidney profile is normal      Lipid profile with cholesterol, triglycerides, HDL and LDL are optimal level      Thyroid panel and TSH are normal      Vitamin D good range at 45 but lower than before which was 65, start on 600 IU Vit.D 3 daily recheck at aex      CBC all normal except for platelets which are low at 116, normal 150-400      Hgb A1-c is elevated at 5.9, normal < 5.7 which indicate increase of diabetes      Patient needs to PCP management for Platelets and Hgb.A1-c. Does she have PCP or need referral.      Will copy of labs for referral or PCP.       ------

## 2013-08-07 NOTE — Telephone Encounter (Signed)
Spoke my patient. Advised patient of message from [place]. Patient agreeable. Lab appointment scheduled for next Friday 8/14 at 8:30am. Patient agreeable to date and time. Order for CBC placed.  Routing to provider for final review. Patient agreeable to disposition. Will close encounter

## 2013-08-07 NOTE — Addendum Note (Signed)
Addended by: Jasmine Awe on: 08/07/2013 02:45 PM   Modules accepted: Orders

## 2013-08-07 NOTE — Telephone Encounter (Signed)
Patient called back to cancel appointment and call was transferred to me as I had just scheduled it. Advised patient that she needs this appointment for follow up. Advised it is important for her to have redraw. Patient states that she does not want to keep appointment and would like to cancel it. Does not want to reschedule.  Routing to Regina Eck CNM as Juluis Rainier and review

## 2013-08-07 NOTE — Telephone Encounter (Signed)
If she is not going to see PCP , we need to recheck her platelet count. Have patient stop all supplements for one  Week and come in for recheck of CBC. Please schedule

## 2013-08-14 ENCOUNTER — Other Ambulatory Visit: Payer: BC Managed Care – PPO

## 2013-08-17 ENCOUNTER — Telehealth: Payer: Self-pay | Admitting: Certified Nurse Midwife

## 2013-08-17 NOTE — Telephone Encounter (Signed)
Routing to Debbi for Primary care recommendation.

## 2013-08-17 NOTE — Telephone Encounter (Signed)
Patient is asking for a recommendation for a PCP doctor from [place]. Patient says at her last visit [name] would recommend a doctor.

## 2013-08-17 NOTE — Telephone Encounter (Signed)
[facility], Dr. Cari Caraway

## 2013-08-17 NOTE — Telephone Encounter (Signed)
[place] @ [facility] [address] [place] [id number] Phone: [phone]   Patient notified and will call to make a primary care appointment.  Routing to provider for final review. Patient agreeable to disposition. Will close encounter

## 2013-11-02 ENCOUNTER — Encounter: Payer: Self-pay | Admitting: Certified Nurse Midwife

## 2013-11-23 ENCOUNTER — Other Ambulatory Visit: Payer: Self-pay | Admitting: Certified Nurse Midwife

## 2013-11-23 NOTE — Telephone Encounter (Signed)
Last refilled: 08/13/12 #30/6 rfs by Ms. [name] no rx was sent/given Last AEX: 08/04/13 with Ms. [name] AEX Scheduled: 08/10/14 with Ms. [name]  Please Advise.

## 2014-06-16 ENCOUNTER — Ambulatory Visit (INDEPENDENT_AMBULATORY_CARE_PROVIDER_SITE_OTHER): Payer: BLUE CROSS/BLUE SHIELD | Admitting: Emergency Medicine

## 2014-06-16 VITALS — BP 120/70 | HR 75 | Temp 98.0°F | Resp 16 | Ht 63.5 in | Wt 135.0 lb

## 2014-06-16 DIAGNOSIS — H8110 Benign paroxysmal vertigo, unspecified ear: Secondary | ICD-10-CM

## 2014-06-16 MED ORDER — MECLIZINE HCL 25 MG PO TABS: 25.0000 mg | ORAL_TABLET | Freq: Three times a day (TID) | ORAL | Status: DC | PRN

## 2014-06-16 NOTE — Progress Notes (Signed)
Subjective:  Patient ID: Jacqueline Bonilla, female    DOB: Oct 06, 1955  Age: 59 y.o. MRN: [id number]  CC: Dizziness   HPI Jacqueline Bonilla presents  with dizziness. She on Monday was at work and became very dizzy and required using the floor for support after sliding down the wall. This dizziness lasted short. This dizziness was not associated with any neurologic or visual symptoms. The dizziness she experienced was short-lived and she had no antecedent no was or injury. No headache.  Today when she got out of bed she felt immediate dizziness. Dizziness is affected by postural changes and changes in her position of her head. She has no headache no nausea or vomiting no other neurological visual symptoms. She's no fever or chills. No cough or coryza. No pain or pressure in ears. Her hearing is normal. And since leaving the house her dizziness improved somewhat.  she's had no improvement with over-the-counter medication.  History Jacqueline Bonilla has a past medical history of Endometriosis; Dysmenorrhea; Microhematuria; Osteoporosis; and Fibroid.   She has past surgical history that includes Breast biopsy; hysteroscopic resection; and Cesarean section.   Her  family history includes Cancer in her father; Heart attack in her maternal grandfather.  She   reports that she has never smoked. She has never used smokeless tobacco. She reports that she does not drink alcohol or use illicit drugs.  Outpatient Prescriptions Prior to Visit  Medication Sig Dispense Refill  . Ascorbic Acid (VITAMIN C PO) Take by mouth daily.    . B Complex Vitamins (B COMPLEX PO) Take by mouth daily.    . COD LIVER OIL PO Take by mouth as needed.     . Coenzyme Q10 (COQ10 PO) Take by mouth as needed.     Marland Kitchen MAGNESIUM PO Take by mouth as needed.     . Probiotic Product (PROBIOTIC PO) Take by mouth daily.    Marland Kitchen SARAFEM 10 MG TABS take 1 tablet by mouth once daily (Patient not taking: Reported on 06/16/2014) 30 tablet 6   No  facility-administered medications prior to visit.    History   Social History  . Marital Status: Married    Spouse Name: N/A  . Number of Children: N/A  . Years of Education: N/A   Social History Main Topics  . Smoking status: Never Smoker   . Smokeless tobacco: Never Used  . Alcohol Use: No  . Drug Use: No  . Sexual Activity: No     Comment: husband vasectomy   Other Topics Concern  . None   Social History Narrative     Review of Systems  Constitutional: Negative for fever, chills and appetite change.  HENT: Negative for congestion, ear pain, postnasal drip, sinus pressure and sore throat.   Eyes: Negative for pain and redness.  Respiratory: Negative for cough, shortness of breath and wheezing.   Cardiovascular: Negative for leg swelling.  Gastrointestinal: Negative for nausea, vomiting, abdominal pain, diarrhea, constipation and blood in stool.  Endocrine: Negative for polyuria.  Genitourinary: Negative for dysuria, urgency, frequency and flank pain.  Musculoskeletal: Negative for gait problem.  Skin: Negative for rash.  Neurological: Positive for dizziness. Negative for tremors, seizures, syncope, speech difficulty, weakness, light-headedness, numbness and headaches.  Psychiatric/Behavioral: Negative for confusion and decreased concentration. The patient is not nervous/anxious.     Objective:  BP 120/70 mmHg  Pulse 75  Temp(Src) 98 F (36.7 C) (Oral)  Resp 16  Ht 5' 3.5" (1.613 m)  Wt 135  lb (61.236 kg)  BMI 23.54 kg/m2  SpO2 98%  LMP 01/02/2003  Physical Exam  Constitutional: She is oriented to person, place, and time. She appears well-developed and well-nourished. No distress.  HENT:  Head: Normocephalic and atraumatic.  Right Ear: External ear normal.  Left Ear: External ear normal.  Nose: Nose normal.  Eyes: Conjunctivae and EOM are normal. Pupils are equal, round, and reactive to light. No scleral icterus.  Neck: Normal range of motion. Neck  supple. No tracheal deviation present.  Cardiovascular: Normal rate, regular rhythm and normal heart sounds.   Pulmonary/Chest: Effort normal. No respiratory distress. She has no wheezes. She has no rales.  Abdominal: She exhibits no mass. There is no tenderness. There is no rebound and no guarding.  Musculoskeletal: She exhibits no edema.  Lymphadenopathy:    She has no cervical adenopathy.  Neurological: She is alert and oriented to person, place, and time. Coordination normal.  Skin: Skin is warm and dry. No rash noted.  Psychiatric: She has a normal mood and affect. Her behavior is normal.      Assessment & Plan:   Jacqueline Bonilla was seen today for dizziness.  Diagnoses and all orders for this visit:  Vertigo, benign paroxysmal, unspecified laterality  Other orders -     meclizine (ANTIVERT) 25 MG tablet; Take 1 tablet (25 mg total) by mouth 3 (three) times daily as needed for dizziness.   I am having Jacqueline Bonilla start on meclizine. I am also having her maintain her Ascorbic Acid (VITAMIN C PO), Coenzyme Q10 (COQ10 PO), Probiotic Product (PROBIOTIC PO), MAGNESIUM PO, COD LIVER OIL PO, B Complex Vitamins (B COMPLEX PO), SARAFEM, and Cholecalciferol (VITAMIN D PO).  Meds ordered this encounter  Medications  . Cholecalciferol (VITAMIN D PO)    Sig: Take by mouth.  . meclizine (ANTIVERT) 25 MG tablet    Sig: Take 1 tablet (25 mg total) by mouth 3 (three) times daily as needed for dizziness.    Dispense:  30 tablet    Refill:  0    Appropriate red flag conditions were discussed with the patient as well as actions that should be taken.  Patient expressed his understanding.  Follow-up: Return if symptoms worsen or fail to improve.  Roselee Culver, MD

## 2014-06-16 NOTE — Patient Instructions (Signed)
Benign Positional Vertigo Vertigo means you feel like you or your surroundings are moving when they are not. Benign positional vertigo is the most common form of vertigo. Benign means that the cause of your condition is not serious. Benign positional vertigo is more common in older adults. CAUSES  Benign positional vertigo is the result of an upset in the labyrinth system. This is an area in the middle ear that helps control your balance. This may be caused by a viral infection, head injury, or repetitive motion. However, often no specific cause is found. SYMPTOMS  Symptoms of benign positional vertigo occur when you move your head or eyes in different directions. Some of the symptoms may include:  Loss of balance and falls.  Vomiting.  Blurred vision.  Dizziness.  Nausea.  Involuntary eye movements (nystagmus). DIAGNOSIS  Benign positional vertigo is usually diagnosed by physical exam. If the specific cause of your benign positional vertigo is unknown, your caregiver may perform imaging tests, such as magnetic resonance imaging (MRI) or computed tomography (CT). TREATMENT  Your caregiver may recommend movements or procedures to correct the benign positional vertigo. Medicines such as meclizine, benzodiazepines, and medicines for nausea may be used to treat your symptoms. In rare cases, if your symptoms are caused by certain conditions that affect the inner ear, you may need surgery. HOME CARE INSTRUCTIONS   Follow your caregiver's instructions.  Move slowly. Do not make sudden body or head movements.  Avoid driving.  Avoid operating heavy machinery.  Avoid performing any tasks that would be dangerous to you or others during a vertigo episode.  Drink enough fluids to keep your urine clear or pale yellow. SEEK IMMEDIATE MEDICAL CARE IF:   You develop problems with walking, weakness, numbness, or using your arms, hands, or legs.  You have difficulty speaking.  You develop  severe headaches.  Your nausea or vomiting continues or gets worse.  You develop visual changes.  Your family or friends notice any behavioral changes.  Your condition gets worse.  You have a fever.  You develop a stiff neck or sensitivity to light. MAKE SURE YOU:   Understand these instructions.  Will watch your condition.  Will get help right away if you are not doing well or get worse. Document Released: 09/25/2005 Document Revised: 03/12/2011 Document Reviewed: 09/07/2010 [employer] Patient Information 2015 [employer]. This information is not intended to replace advice given to you by your health care provider. Make sure you discuss any questions you have with your health care provider.    

## 2014-08-10 ENCOUNTER — Ambulatory Visit: Payer: BC Managed Care – PPO | Admitting: Certified Nurse Midwife

## 2014-08-10 ENCOUNTER — Encounter: Payer: Self-pay | Admitting: Certified Nurse Midwife

## 2015-07-11 ENCOUNTER — Ambulatory Visit (INDEPENDENT_AMBULATORY_CARE_PROVIDER_SITE_OTHER): Payer: BLUE CROSS/BLUE SHIELD | Admitting: Family Medicine

## 2015-07-11 VITALS — BP 110/80 | HR 74 | Temp 98.3°F | Resp 16 | Ht 63.0 in | Wt 130.0 lb

## 2015-07-11 DIAGNOSIS — R42 Dizziness and giddiness: Secondary | ICD-10-CM | POA: Diagnosis not present

## 2015-07-11 DIAGNOSIS — R51 Headache: Secondary | ICD-10-CM | POA: Diagnosis not present

## 2015-07-11 DIAGNOSIS — R519 Headache, unspecified: Secondary | ICD-10-CM

## 2015-07-11 DIAGNOSIS — Z8679 Personal history of other diseases of the circulatory system: Secondary | ICD-10-CM | POA: Diagnosis not present

## 2015-07-11 DIAGNOSIS — T675XXA Heat exhaustion, unspecified, initial encounter: Secondary | ICD-10-CM | POA: Diagnosis not present

## 2015-07-11 DIAGNOSIS — R7303 Prediabetes: Secondary | ICD-10-CM

## 2015-07-11 DIAGNOSIS — Z87898 Personal history of other specified conditions: Secondary | ICD-10-CM

## 2015-07-11 LAB — POCT URINALYSIS DIP (MANUAL ENTRY)
Bilirubin, UA: NEGATIVE
Glucose, UA: NEGATIVE
Ketones, POC UA: NEGATIVE
Nitrite, UA: NEGATIVE
PH UA: 5
PROTEIN UA: NEGATIVE
Spec Grav, UA: 1.02
UROBILINOGEN UA: 0.2

## 2015-07-11 LAB — COMPLETE METABOLIC PANEL WITH GFR
ALBUMIN: 4.2 g/dL (ref 3.6–5.1)
ALK PHOS: 60 U/L (ref 33–130)
ALT: 12 U/L (ref 6–29)
AST: 17 U/L (ref 10–35)
BUN: 20 mg/dL (ref 7–25)
CO2: 29 mmol/L (ref 20–31)
CREATININE: 0.91 mg/dL (ref 0.50–0.99)
Calcium: 8.9 mg/dL (ref 8.6–10.4)
Chloride: 104 mmol/L (ref 98–110)
GFR, EST AFRICAN AMERICAN: 78 mL/min (ref >=60)
GFR, EST NON AFRICAN AMERICAN: 68 mL/min (ref >=60)
Glucose, Bld: 84 mg/dL (ref 65–99)
Potassium: 4 mmol/L (ref 3.5–5.3)
Sodium: 142 mmol/L (ref 135–146)
Total Bilirubin: 0.3 mg/dL (ref 0.2–1.2)
Total Protein: 7.2 g/dL (ref 6.1–8.1)

## 2015-07-11 LAB — POCT CBC
Granulocyte percent: 65.8 %G (ref 37–80)
HEMATOCRIT: 37.2 % — AB (ref 37.7–47.9)
HEMOGLOBIN: 12.8 g/dL (ref 12.2–16.2)
Lymph, poc: 1.7 (ref 0.6–3.4)
MCH, POC: 30.7 pg (ref 27–31.2)
MCHC: 34.5 g/dL (ref 31.8–35.4)
MCV: 88.9 fL (ref 80–97)
MID (cbc): 0.4 (ref 0–0.9)
MPV: 6.8 fL (ref 0–99.8)
POC GRANULOCYTE: 4.1 (ref 2–6.9)
POC LYMPH PERCENT: 27.4 %L (ref 10–50)
POC MID %: 6.8 % (ref 0–12)
Platelet Count, POC: 124 10*3/uL — AB (ref 142–424)
RBC: 4.18 M/uL (ref 4.04–5.48)
RDW, POC: 13.3 %
WBC: 6.2 10*3/uL (ref 4.6–10.2)

## 2015-07-11 LAB — TSH: TSH: 0.92 mIU/L

## 2015-07-11 LAB — CK: CK TOTAL: 75 U/L (ref 7–177)

## 2015-07-11 LAB — GLUCOSE, POCT (MANUAL RESULT ENTRY): POC Glucose: 81 mg/dl (ref 70–99)

## 2015-07-11 NOTE — Patient Instructions (Addendum)
IF you received an x-ray today, you will receive an invoice from [facility]. Please contact [facility] at [phone] with questions or concerns regarding your invoice.   IF you received labwork today, you will receive an invoice from [employer]. Please contact [facility] at [phone] with questions or concerns regarding your invoice.   Our billing staff will not be able to assist you with questions regarding bills from these companies.  You will be contacted with the lab results as soon as they are available. The fastest way to get your results is to activate your My Chart account. Instructions are located on the last page of this paperwork. If you have not heard from [name] regarding the results in 2 weeks, please contact this office.    Blood sugar and blood counts overall looks okay today. There was some possible small amount of blood or possible infection in the urine, but if you're not having any symptoms of urinary tract infection now, no treatment is currently needed. If you do have burning, urinary frequency, or abdominal pain, return for repeat testing and possible antibiotics.  I will check other tests for your kidney function as well as a muscle tests and should have his back later tonight. We will let you know if there are any concerning findings. Otherwise continue to drink fluids, rest, and if your headache and other symptoms are not improving in the next 48 hours, return for recheck.  Return to the clinic or go to the nearest emergency room if any of your symptoms worsen or new symptoms occur.  Heat Exhaustion Information WHAT IS HEAT EXHAUSTION? Heat exhaustion happens when your body gets overheated from hot weather or from exercise. Heat exhaustion makes the temperature of your skin and body go up. Your body cools itself by sweating. If you do not drink enough water to replace what you sweat, you lose too much water and salt.  This makes it harder for your body to produce more sweat. When you do not sweat enough, your body cannot cool down, and heat exhaustion may result. Heat exhaustion can lead to heatstroke, which is a more serious illness. WHO IS AT RISK FOR HEAT EXHAUSTION? Anyone can get heat exhaustion. However, heat exhaustion is more likely when you are exercising or doing a physical activity. It is also more likely when you are in hot and humid weather or bright sunshine. Heat exhaustion is also more likely to develop in:  People who are age 36 or older.  Children.  People who have a medical condition such as heart disease, poor circulation, sickle cell disease, or high blood pressure.  People who have a fever.  People who are very overweight (obese).  People who are dehydrated from:  Drinking alcohol or caffeine.  Taking certain medicines, such as diuretics or stimulants. WHAT ARE THE SYMPTOMS OF HEAT EXHAUSTION? Symptoms of heat exhaustion include:  A body temperature of up to 104F (40C).  Moist, cool, and clammy skin.  Dizziness.  Headache.  Nausea.  Fatigue.  Thirst.  Dark-colored urine.  Rapid pulse or heartbeat.  Weakness.  Muscle cramps.  Confusion.  Fainting. WHAT SHOULD I DO IF I THINK I HAVE HEAT EXHAUSTION? If you think that you have heat exhaustion, call your health care provider. Follow his or her instructions. You should also:  Call a friend or a family member and ask someone to stay with you.  Move to a cooler location, such as:  Into the  shade.  In front of a fan.  Someplace that has air conditioning.  Lie down and rest.  Slowly drink nonalcoholic, caffeine-free fluids.  Take off any extra clothing or tight-fitting clothes.  Take a cool bath or shower, if possible. If you do not have access to a bath or shower, dab or mist cool water on your skin. WHY IS IT IMPORTANT TO TREAT HEAT EXHAUSTION? It is extremely important to take care of yourself  and treat heat exhaustion as soon as possible. Untreated heat exhaustion can turn into heatstroke. Symptoms of heatstroke include:  A body temperature of 104F (40C) or higher.  Hot, red skin that may be dry or moist.  Severe headache.  Nausea and vomiting.  Muscle weakness and cramping.  Confusion.  Rapid breathing.  Fainting.  Seizure. These symptoms may represent a serious problem that is an emergency. Do not wait to see if the symptoms will go away. Get medical help right away. Call your local emergency services (911 in the [place] Do not drive yourself to the hospital. Heatstroke is a life-threatening condition that requires urgent medical treatment. Do not treat heatstroke at home. Heatstroke should be treated by a health care professional and may require hospitalization. At the hospital, you may need to receive fluids through an IV tube:  If you cannot drink any fluids.  If you vomit any fluids that you drink.  If your symptoms do not get better after one hour.  If your symptoms get worse after one hour. HOW CAN I PREVENT HEAT EXHAUSTION?  Avoid outdoor activities on very hot or humid days.  Do not exercise or do other physical activity when you are not feeling well.  Drink plenty of nonalcoholic and caffeine-free fluids before and during physical activity.  Take frequent breaks for rest during physical activity.  Wear light-colored, loose-fitting, and lightweight clothing in the heat.  Wear a hat and use sunscreen when exercising outdoors.  Avoid being outside during the hottest times of the day.  Check with your health care provider before you start any new activity, especially if you take medicine or have a medical condition.  Start any new activity slowly and work up to your fitness level. HOW CAN I HELP TO PROTECT ELDERLY RELATIVES AND NEIGHBORS FROM HEAT EXHAUSTION? People who are age 83 or older are at greater risk for heat exhaustion. Their bodies have  a harder time adjusting to heat. They are also more likely to have a medical condition or be on medicines that increase their risk for heat exhaustion. They may get heat exhaustion indoors if the heat is high for several days. You can help to protect them during hot weather by:  Checking on them two or more times each day.  Making sure that they are drinking plenty of cool, nonalcoholic, and caffeine-free fluids.  Making sure that they use their air conditioner.  Taking them to a location where air conditioning is available.  Talking with their health care provider about their medical needs, medicines, and fluid requirements.   This information is not intended to replace advice given to you by your health care provider. Make sure you discuss any questions you have with your health care provider.   Document Released: 09/27/2007 Document Revised: 09/08/2014 Document Reviewed: 11/25/2013 Elsevier Interactive Patient Education [employer].

## 2015-07-11 NOTE — Progress Notes (Signed)
Subjective:  By signing my name below, I, Jacqueline Bonilla, attest that this documentation has been prepared under the direction and in the presence of Jacqueline Ray, MD. Electronically Signed: Moises Bonilla, [place]. 07/11/2015 , 3:04 PM .  Patient was seen in Room 9 .   Patient ID: Jacqueline Bonilla, female    DOB: 1955/12/12, 60 y.o.   MRN: [id number] Chief Complaint  Patient presents with  . Over heated in sun    x 1 day    HPI Jacqueline Bonilla is a 60 y.o. female Patient states she was out with her mother in [name]. They were out in the heat, walking, and playing ping-pong. When she returned to her car, that was heated too. She was aiming to drive to her sister's at about 4:00PM, but started feeling dizzy and pulled over to the side of the road. She doesn't recall if she had slurred speech, facial droop, focal weakness, syncope, chest pain or palpitations when it occurred. She called her sister and informed her of the situation and her symptoms.   Her sister drove to meet up with the patient. Patient informs drinking the water twice, but threw it up both times. She denies Bonilla in the vomit. When she arrived at her sister's house at 4:30PM, she was able to lay down in an air conditioned room and drink gatorade. She had slight improvement but continued bad headache. Later in the evening, her sister was able to drive her home, and patient continued to drink gatorade and watermelon water.   Today, she had improvement, but still has some dizziness with slight headache, feeling clouded. She mentions having heat illness in the past. She also notes sporadic palpitations in the past. She denies chest pain or breathing problems with activity. She felt similar symptoms with her visit 1 year ago, seen by Dr. Ouida Bonilla, for benign paroxysmal vertigo. She had a hemoglobin at 12.4 and A1c at 5.9 in August 2015.   There are no active problems to display for this patient.  Past Medical History    Diagnosis Date  . Endometriosis   . Dysmenorrhea   . Microhematuria     negative workup  . Osteoporosis   . Fibroid    Past Surgical History  Procedure Laterality Date  . Breast biopsy    . Hysteroscopic resection    . Cesarean section     Allergies  Allergen Reactions  . Floraquin [Iodoquinol]     No cipro or others  . Iohexol      Code: HIVES, Desc: HIVES S/P IVP MANY YRS AGO...NO PROBLEM W/O PREP TODAY/A.CALHOUN, Onset Date: [id number]    Prior to Admission medications   Medication Sig Start Date End Date Taking? Authorizing Provider  Ascorbic Acid (VITAMIN C PO) Take by mouth daily.   Yes Historical Provider, MD  COD LIVER OIL PO Take by mouth as needed.    Yes Historical Provider, MD  MAGNESIUM PO Take by mouth as needed.    Yes Historical Provider, MD  Probiotic Product (PROBIOTIC PO) Take by mouth daily.   Yes Historical Provider, MD   Social History   Social History  . Marital Status: Married    Spouse Name: N/A  . Number of Children: N/A  . Years of Education: N/A   Occupational History  . Not on file.   Social History Main Topics  . Smoking status: Never Smoker   . Smokeless tobacco: Never Used  . Alcohol Use: No  . Drug Use:  No  . Sexual Activity: No     Comment: husband vasectomy   Other Topics Concern  . Not on file   Social History Narrative   Review of Systems  Constitutional: Negative for fatigue and unexpected weight change.  Respiratory: Negative for chest tightness and shortness of breath.   Cardiovascular: Negative for chest pain, palpitations and leg swelling.  Gastrointestinal: Negative for abdominal pain and Bonilla in stool.  Neurological: Positive for dizziness and headaches. Negative for syncope and light-headedness.       Objective:   Physical Exam  Constitutional: She is oriented to person, place, and time. She appears well-developed and well-nourished.  HENT:  Head: Normocephalic and atraumatic.  Eyes: Conjunctivae and EOM  are normal. Pupils are equal, round, and reactive to light. Right eye exhibits no nystagmus. Left eye exhibits no nystagmus.  No nystagmus including supine or head movement  Neck: Carotid bruit is not present.  Cardiovascular: Normal rate, regular rhythm, normal heart sounds and intact distal pulses.   Pulmonary/Chest: Effort normal and breath sounds normal.  Abdominal: Soft. She exhibits no pulsatile midline mass. There is no tenderness.  Neurological: She is alert and oriented to person, place, and time.  Non focal exam, no pronator drift, normal heel-to-toe, normal finger-to-nose  Skin: Skin is warm and dry.  Psychiatric: She has a normal mood and affect. Her behavior is normal.  Vitals reviewed.   Filed Vitals:   07/11/15 1416  BP: 110/80  Pulse: 74  Temp: 98.3 F (36.8 C)  TempSrc: Oral  Resp: 16  Height: 5\' 3"  (1.6 m)  Weight: 130 lb (58.968 kg)  SpO2: 99%   EKG: sinus rhythm, no acute findings  Results for orders placed or performed in visit on 07/11/15  POCT glucose (manual entry)  Result Value Ref Range   POC Glucose 81 70 - 99 mg/dl  POCT CBC  Result Value Ref Range   WBC 6.2 4.6 - 10.2 K/uL   Lymph, poc 1.7 0.6 - 3.4   POC LYMPH PERCENT 27.4 10 - 50 %L   MID (cbc) 0.4 0 - 0.9   POC MID % 6.8 0 - 12 %M   POC Granulocyte 4.1 2 - 6.9   Granulocyte percent 65.8 37 - 80 %G   RBC 4.18 4.04 - 5.48 M/uL   Hemoglobin 12.8 12.2 - 16.2 g/dL   HCT, POC 37.2 (A) 37.7 - 47.9 %   MCV 88.9 80 - 97 fL   MCH, POC 30.7 27 - 31.2 pg   MCHC 34.5 31.8 - 35.4 g/dL   RDW, POC 13.3 %   Platelet Count, POC 124 (A) 142 - 424 K/uL   MPV 6.8 0 - 99.8 fL  POCT urinalysis dipstick  Result Value Ref Range   Color, UA yellow yellow   Clarity, UA clear clear   Glucose, UA negative negative   Bilirubin, UA negative negative   Ketones, POC UA negative negative   Spec Grav, UA 1.020    Bonilla, UA trace-intact (A) negative   pH, UA 5.0    Protein Ur, POC negative negative    Urobilinogen, UA 0.2    Nitrite, UA Negative Negative   Leukocytes, UA Trace (A) Negative      Assessment & Plan:   Jacqueline Bonilla is a 60 y.o. female Heat exhaustion, initial encounter - Plan: COMPLETE METABOLIC PANEL WITH GFR, CK, POCT urinalysis dipstick  Dizziness - Plan: COMPLETE METABOLIC PANEL WITH GFR, EKG 12-Lead, POCT glucose (manual entry), POCT  CBC, POCT urinalysis dipstick  History of palpitations - Plan: TSH, EKG 12-Lead  Acute nonintractable headache, unspecified headache type  Prediabetes - Plan: POCT glucose (manual entry)  Suspected heat illness yesterday with vertigo type symptoms. No signs of vertigo at this time. Symptomatic improvement, but still some fatigue, headache, dizziness. Nonfocal neuro exam.   -Continue fluids, relative rest, and RTC precautions if not improving the next 48 hours.  -Will check stat CMP to look for AK I/renal function as well as CPK to rule out rhabdo.  -Urinalysis with possible myoglobin instead of Bonilla, and no urinary symptoms currently, RTC precautions if symptomatic.  -ER/RTC precautions given, and if symptoms occur again, recommended ER/911 evaluation for heat illness.   No orders of the defined types were placed in this encounter.   Patient Instructions       IF you received an x-Bonilla today, you will receive an invoice from [facility]. Please contact [facility] at [phone] with questions or concerns regarding your invoice.   IF you received labwork today, you will receive an invoice from [employer]. Please contact [facility] at [phone] with questions or concerns regarding your invoice.   Our billing staff will not be able to assist you with questions regarding bills from these companies.  You will be contacted with the lab results as soon as they are available. The fastest way to get your results is to activate your My Chart account. Instructions are located on the  last page of this paperwork. If you have not heard from [name] regarding the results in 2 weeks, please contact this office.    Bonilla sugar and Bonilla counts overall looks okay today. There was some possible small amount of Bonilla or possible infection in the urine, but if you're not having any symptoms of urinary tract infection now, no treatment is currently needed. If you do have burning, urinary frequency, or abdominal pain, return for repeat testing and possible antibiotics.  I will check other tests for your kidney function as well as a muscle tests and should have his back later tonight. We will let you know if there are any concerning findings. Otherwise continue to drink fluids, rest, and if your headache and other symptoms are not improving in the next 48 hours, return for recheck.  Return to the clinic or go to the nearest emergency room if any of your symptoms worsen or new symptoms occur.  Heat Exhaustion Information WHAT IS HEAT EXHAUSTION? Heat exhaustion happens when your body gets overheated from hot weather or from exercise. Heat exhaustion makes the temperature of your skin and body go up. Your body cools itself by sweating. If you do not drink enough water to replace what you sweat, you lose too much water and salt. This makes it harder for your body to produce more sweat. When you do not sweat enough, your body cannot cool down, and heat exhaustion may result. Heat exhaustion can lead to heatstroke, which is a more serious illness. WHO IS AT RISK FOR HEAT EXHAUSTION? Anyone can get heat exhaustion. However, heat exhaustion is more likely when you are exercising or doing a physical activity. It is also more likely when you are in hot and humid weather or bright sunshine. Heat exhaustion is also more likely to develop in:  People who are age 17 or older.  Children.  People who have a medical condition such as heart disease, poor circulation, sickle cell disease, or high Bonilla  pressure.  People  who have a fever.  People who are very overweight (obese).  People who are dehydrated from:  Drinking alcohol or caffeine.  Taking certain medicines, such as diuretics or stimulants. WHAT ARE THE SYMPTOMS OF HEAT EXHAUSTION? Symptoms of heat exhaustion include:  A body temperature of up to 104F (40C).  Moist, cool, and clammy skin.  Dizziness.  Headache.  Nausea.  Fatigue.  Thirst.  Dark-colored urine.  Rapid pulse or heartbeat.  Weakness.  Muscle cramps.  Confusion.  Fainting. WHAT SHOULD I DO IF I THINK I HAVE HEAT EXHAUSTION? If you think that you have heat exhaustion, call your health care provider. Follow his or her instructions. You should also:  Call a friend or a family member and ask someone to stay with you.  Move to a cooler location, such as:  Into the shade.  In front of a fan.  Someplace that has air conditioning.  Lie down and rest.  Slowly drink nonalcoholic, caffeine-free fluids.  Take off any extra clothing or tight-fitting clothes.  Take a cool bath or shower, if possible. If you do not have access to a bath or shower, dab or mist cool water on your skin. WHY IS IT IMPORTANT TO TREAT HEAT EXHAUSTION? It is extremely important to take care of yourself and treat heat exhaustion as soon as possible. Untreated heat exhaustion can turn into heatstroke. Symptoms of heatstroke include:  A body temperature of 104F (40C) or higher.  Hot, red skin that may be dry or moist.  Severe headache.  Nausea and vomiting.  Muscle weakness and cramping.  Confusion.  Rapid breathing.  Fainting.  Seizure. These symptoms may represent a serious problem that is an emergency. Do not wait to see if the symptoms will go away. Get medical help right away. Call your local emergency services (911 in the [place] Do not drive yourself to the hospital. Heatstroke is a life-threatening condition that requires urgent medical  treatment. Do not treat heatstroke at home. Heatstroke should be treated by a health care professional and may require hospitalization. At the hospital, you may need to receive fluids through an IV tube:  If you cannot drink any fluids.  If you vomit any fluids that you drink.  If your symptoms do not get better after one hour.  If your symptoms get worse after one hour. HOW CAN I PREVENT HEAT EXHAUSTION?  Avoid outdoor activities on very hot or humid days.  Do not exercise or do other physical activity when you are not feeling well.  Drink plenty of nonalcoholic and caffeine-free fluids before and during physical activity.  Take frequent breaks for rest during physical activity.  Wear light-colored, loose-fitting, and lightweight clothing in the heat.  Wear a hat and use sunscreen when exercising outdoors.  Avoid being outside during the hottest times of the day.  Check with your health care provider before you start any new activity, especially if you take medicine or have a medical condition.  Start any new activity slowly and work up to your fitness level. HOW CAN I HELP TO PROTECT ELDERLY RELATIVES AND NEIGHBORS FROM HEAT EXHAUSTION? People who are age 26 or older are at greater risk for heat exhaustion. Their bodies have a harder time adjusting to heat. They are also more likely to have a medical condition or be on medicines that increase their risk for heat exhaustion. They may get heat exhaustion indoors if the heat is high for several days. You can help to protect them during  hot weather by:  Checking on them two or more times each day.  Making sure that they are drinking plenty of cool, nonalcoholic, and caffeine-free fluids.  Making sure that they use their air conditioner.  Taking them to a location where air conditioning is available.  Talking with their health care provider about their medical needs, medicines, and fluid requirements.   This information is not  intended to replace advice given to you by your health care provider. Make sure you discuss any questions you have with your health care provider.   Document Released: 09/27/2007 Document Revised: 09/08/2014 Document Reviewed: 11/25/2013 Elsevier Interactive Patient Education [employer].     I personally performed the services described in this documentation, which was scribed in my presence. The recorded information has been reviewed and considered, and addended by me as needed.   Signed,   Jacqueline Ray, MD [facility].  07/11/2015 4:02 PM

## 2015-07-29 DIAGNOSIS — H2513 Age-related nuclear cataract, bilateral: Secondary | ICD-10-CM | POA: Diagnosis not present

## 2015-07-29 DIAGNOSIS — H5213 Myopia, bilateral: Secondary | ICD-10-CM | POA: Diagnosis not present

## 2015-07-29 DIAGNOSIS — H40013 Open angle with borderline findings, low risk, bilateral: Secondary | ICD-10-CM | POA: Diagnosis not present

## 2015-10-03 DIAGNOSIS — H5210 Myopia, unspecified eye: Secondary | ICD-10-CM | POA: Diagnosis not present

## 2016-06-03 DIAGNOSIS — H5213 Myopia, bilateral: Secondary | ICD-10-CM | POA: Diagnosis not present

## 2016-10-18 ENCOUNTER — Telehealth: Payer: Self-pay | Admitting: Physician Assistant

## 2016-10-18 ENCOUNTER — Ambulatory Visit (INDEPENDENT_AMBULATORY_CARE_PROVIDER_SITE_OTHER): Payer: BLUE CROSS/BLUE SHIELD | Admitting: Physician Assistant

## 2016-10-18 ENCOUNTER — Encounter: Payer: Self-pay | Admitting: Physician Assistant

## 2016-10-18 VITALS — BP 116/60 | HR 80 | Temp 98.3°F | Resp 16 | Ht 63.0 in | Wt 127.0 lb

## 2016-10-18 DIAGNOSIS — R5383 Other fatigue: Secondary | ICD-10-CM | POA: Diagnosis not present

## 2016-10-18 LAB — POCT URINALYSIS DIP (MANUAL ENTRY)
BILIRUBIN UA: NEGATIVE
GLUCOSE UA: NEGATIVE mg/dL
Leukocytes, UA: NEGATIVE
NITRITE UA: NEGATIVE
PH UA: 5.5 (ref 5.0–8.0)
Protein Ur, POC: NEGATIVE mg/dL
RBC UA: NEGATIVE
UROBILINOGEN UA: 0.2 U/dL

## 2016-10-18 NOTE — Patient Instructions (Addendum)
I will have your lab results shortly.  I will likely have you follow up with me to discuss these lab results in person. In the meantime, make sure you are hydrating well with 64 oz of water, and review the guidelines below on fatigue.    Fatigue Fatigue is feeling tired all of the time, a lack of energy, or a lack of motivation. Occasional or mild fatigue is often a normal response to activity or life in general. However, long-lasting (chronic) or extreme fatigue may indicate an underlying medical condition. Follow these instructions at home: Watch your fatigue for any changes. The following actions may help to lessen any discomfort you are feeling:  Talk to your health care provider about how much sleep you need each night. Try to get the required amount every night.  Take medicines only as directed by your health care provider.  Eat a healthy and nutritious diet. Ask your health care provider if you need help changing your diet.  Drink enough fluid to keep your urine clear or pale yellow.  Practice ways of relaxing, such as yoga, meditation, massage therapy, or acupuncture.  Exercise regularly.  Change situations that cause you stress. Try to keep your work and personal routine reasonable.  Do not abuse illegal drugs.  Limit alcohol intake to no more than 1 drink per day for nonpregnant women and 2 drinks per day for men. One drink equals 12 ounces of beer, 5 ounces of wine, or 1 ounces of hard liquor.  Take a multivitamin, if directed by your health care provider.  Contact a health care provider if:  Your fatigue does not get better.  You have a fever.  You have unintentional weight loss or gain.  You have headaches.  You have difficulty: ? Falling asleep. ? Sleeping throughout the night.  You feel angry, guilty, anxious, or sad.  You are unable to have a bowel movement (constipation).  You skin is dry.  Your legs or another part of your body is swollen. Get help  right away if:  You feel confused.  Your vision is blurry.  You feel faint or pass out.  You have a severe headache.  You have severe abdominal, pelvic, or back pain.  You have chest pain, shortness of breath, or an irregular or fast heartbeat.  You are unable to urinate or you urinate less than normal.  You develop abnormal bleeding, such as bleeding from the rectum, vagina, nose, lungs, or nipples.  You vomit blood.  You have thoughts about harming yourself or committing suicide.  You are worried that you might harm someone else. This information is not intended to replace advice given to you by your health care provider. Make sure you discuss any questions you have with your health care provider. Document Released: 10/15/2006 Document Revised: 05/26/2015 Document Reviewed: 04/21/2013 Elsevier Interactive Patient Education  2018 [employer].      IF you received an x-ray today, you will receive an invoice from [facility]. Please contact [facility] at [phone] with questions or concerns regarding your invoice.   IF you received labwork today, you will receive an invoice from [place]. Please contact [facility] at [phone] with questions or concerns regarding your invoice.   Our billing staff will not be able to assist you with questions regarding bills from these companies.  You will be contacted with the lab results as soon as they are available. The fastest way to get your results is to activate your My Chart  account. Instructions are located on the last page of this paperwork. If you have not heard from [name] regarding the results in 2 weeks, please contact this office.

## 2016-10-18 NOTE — Progress Notes (Signed)
PRIMARY CARE AT [facility] [address] [place] [address] [phone]  Date:  10/18/2016   Name:  Jacqueline Bonilla   DOB:  1955/12/08   MRN:  [id number]  PCP:  Patient, No Pcp Per    History of Present Illness:  Jacqueline Bonilla is a 61 y.o. female patient who presents to PCP with  Chief Complaint  Patient presents with  . blood work    pt would like to get labs to make sure everything is ok, no symptoms.     She reports that she feels very tired "all the time". She is going to bed without any problems, but wakes at 3am.  She wakes feeling very tired.  Wakes at 5:30am.  She feels some weak at times.  She has had some dizziness, but this is not present today.  No blood or black stool.  Post menopausal.   She has some headaches, frontal headache describes as pressures.  No seasonal allergies.  No fevers. She has occasional nightsweats.   No history of thyroid issues.  She is taking a "thyroid sense" supplement over the counter medication to aid in benefits (that she read up on).  No family history of thyroid disease.  No constipation change in nails where they are splitting very badly.  No change in hair that she can state.   She feels that she is getting past the death of her father, sister.   No history of diabetes.   No pre-syncopal episodes.   No chest pains, palptitations, or sob.   When asked if she feels safe in her home, she reports yes.  She lives with her husband.   She has one son.  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  Wt Readings from Last 3 Encounters:  10/18/16 127 lb (57.6 kg)  07/11/15 130 lb (59 kg)  06/16/14 135 lb (61.2 kg)    There are no active problems to display for this patient.   Past Medical History:  Diagnosis Date  . Dysmenorrhea   . Endometriosis   . Fibroid   . Microhematuria    negative workup  . Osteoporosis     Past Surgical History:  Procedure Laterality Date  .  BREAST BIOPSY    . CESAREAN SECTION    . hysteroscopic resection      Social History  Substance Use Topics  . Smoking status: Never Smoker  . Smokeless tobacco: Never Used  . Alcohol use No    Family History  Problem Relation Age of Onset  . Cancer Father        non hodgkin lymphoma & skin  . Heart attack Maternal Grandfather     Allergies  Allergen Reactions  . Floraquin [Iodoquinol]     No cipro or others  . Iohexol      Code: HIVES, Desc: HIVES S/P IVP MANY YRS AGO...NO PROBLEM W/O PREP TODAY/A.CALHOUN, Onset Date: [id number]     Medication list has been reviewed and updated.  Current Outpatient Prescriptions on File Prior to Visit  Medication Sig Dispense Refill  . Ascorbic Acid (VITAMIN C PO) Take by mouth daily.    . COD LIVER OIL PO Take by mouth as needed.     Marland Kitchen MAGNESIUM PO Take by mouth as needed.     . Probiotic Product (PROBIOTIC PO) Take by mouth daily.     No current facility-administered medications on file prior  to visit.     ROS ROS otherwise unremarkable unless listed above.  Physical Examination: BP 116/60   Pulse 80   Temp 98.3 F (36.8 C) (Oral)   Resp 16   Ht 5' 3"  (1.6 m)   Wt 127 lb (57.6 kg)   LMP 01/02/2003   SpO2 100%   BMI 22.50 kg/m  Ideal Body Weight: Weight in (lb) to have BMI = 25: 140.8  Physical Exam  Constitutional: She is oriented to person, place, and time. She appears well-developed and well-nourished. No distress.  HENT:  Head: Normocephalic and atraumatic.  Right Ear: External ear normal.  Left Ear: External ear normal.  Eyes: Pupils are equal, round, and reactive to light. Conjunctivae and EOM are normal.  Neck: Normal range of motion. No thyromegaly present.  Cardiovascular: Normal rate, regular rhythm, normal heart sounds and intact distal pulses.  Exam reveals no gallop and no friction rub.   No murmur heard. Pulmonary/Chest: Effort normal. No respiratory distress. She has no wheezes.  Neurological: She is  alert and oriented to person, place, and time.  Skin: Skin is warm and dry. Capillary refill takes less than 2 seconds. She is not diaphoretic.  Psychiatric: She has a normal mood and affect. Her behavior is normal. Judgment and thought content normal. Her mood appears not anxious. Her affect is not angry, not labile and not inappropriate. Her speech is not rapid and/or pressured, not delayed, not tangential and not slurred. She is not actively hallucinating. Cognition and memory are normal. She does not express impulsivity. She does not exhibit a depressed mood. She is attentive.     Assessment and Plan: [name] is a 61 y.o. female who is here today for cc of  Chief Complaint  Patient presents with  . blood work    pt would like to get labs to make sure everything is ok, no symptoms.   I will have the patient return to clinic to discuss lab results.   I do not see what may be causing her fatigue. Possible thyroid disease.  Advised hydration given her specific gravity, at visit Will follow up Other fatigue - Plan: EKG 12-Lead, CBC with Differential/Platelet, CMP14+EGFR, TSH, VITAMIN D 25 Hydroxy (Vit-D Deficiency, Fractures), Vitamin B12, POCT urinalysis dipstick  Ivar Drape, PA-C [facility] 10/19/20189:42 AM

## 2016-10-18 NOTE — Telephone Encounter (Signed)
Pt  Husband is calling wanting to talk with English before she sees his wife at 36 he states he has some personal concerns   The hippa form I see on file is from 07/11/15 and he is on the  Form   Best number [phone]

## 2016-10-18 NOTE — Telephone Encounter (Signed)
Printed note and given to [employer].

## 2016-10-18 NOTE — Telephone Encounter (Signed)
939am  Spoke with patient's husband  He reports that this is the first time she is taking steps to getting to a doctor. 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   Reports that she has lost a "coping mechanism". She takes deep sighs. Feels that she is tormented on something.   Last week she stated that he may be "part of the conspiracy" with them in watching her.  And that he told her work that something is watching her.   He reports that she is able to maintain her job without problem Reports 2 years ago that father died, and "sister was tortured" as she was dying by ECMO.  --he thinks this may be the trigger. I listened, and stated thank you for your information.  I did not provide any information on how we may proceed.  I had not met nor treated the woman at that time.

## 2016-10-19 LAB — CMP14+EGFR
A/G RATIO: 1.4 (ref 1.2–2.2)
ALBUMIN: 4.3 g/dL (ref 3.6–4.8)
ALT: 12 IU/L (ref 0–32)
AST: 20 IU/L (ref 0–40)
Alkaline Phosphatase: 63 IU/L (ref 39–117)
BUN/Creatinine Ratio: 22 (ref 12–28)
BUN: 22 mg/dL (ref 8–27)
Bilirubin Total: 0.4 mg/dL (ref 0.0–1.2)
CALCIUM: 9.4 mg/dL (ref 8.7–10.3)
CO2: 24 mmol/L (ref 20–29)
Chloride: 105 mmol/L (ref 96–106)
Creatinine, Ser: 0.99 mg/dL (ref 0.57–1.00)
GFR, EST AFRICAN AMERICAN: 70 mL/min/{1.73_m2} (ref >59)
GFR, EST NON AFRICAN AMERICAN: 61 mL/min/{1.73_m2} (ref >59)
GLOBULIN, TOTAL: 3.1 g/dL (ref 1.5–4.5)
Glucose: 98 mg/dL (ref 65–99)
POTASSIUM: 3.7 mmol/L (ref 3.5–5.2)
SODIUM: 144 mmol/L (ref 134–144)
TOTAL PROTEIN: 7.4 g/dL (ref 6.0–8.5)

## 2016-10-19 LAB — CBC WITH DIFFERENTIAL/PLATELET
BASOS: 1 %
Basophils Absolute: 0 10*3/uL (ref 0.0–0.2)
EOS (ABSOLUTE): 0.1 10*3/uL (ref 0.0–0.4)
EOS: 1 %
HEMATOCRIT: 38.3 % (ref 34.0–46.6)
Hemoglobin: 12.6 g/dL (ref 11.1–15.9)
IMMATURE GRANS (ABS): 0 10*3/uL (ref 0.0–0.1)
IMMATURE GRANULOCYTES: 0 %
Lymphocytes Absolute: 1.1 10*3/uL (ref 0.7–3.1)
Lymphs: 17 %
MCH: 28.7 pg (ref 26.6–33.0)
MCHC: 32.9 g/dL (ref 31.5–35.7)
MCV: 87 fL (ref 79–97)
MONOS ABS: 0.5 10*3/uL (ref 0.1–0.9)
Monocytes: 7 %
Neutrophils Absolute: 4.8 10*3/uL (ref 1.4–7.0)
Neutrophils: 74 %
Platelets: 137 10*3/uL — ABNORMAL LOW (ref 150–379)
RBC: 4.39 x10E6/uL (ref 3.77–5.28)
RDW: 14.1 % (ref 12.3–15.4)
WBC: 6.4 10*3/uL (ref 3.4–10.8)

## 2016-10-19 LAB — VITAMIN B12: VITAMIN B 12: 729 pg/mL (ref 232–1245)

## 2016-10-19 LAB — VITAMIN D 25 HYDROXY (VIT D DEFICIENCY, FRACTURES): Vit D, 25-Hydroxy: 42.7 ng/mL (ref 30.0–100.0)

## 2016-10-19 LAB — TSH: TSH: 1.16 u[IU]/mL (ref 0.450–4.500)

## 2016-10-24 NOTE — Telephone Encounter (Signed)
Please contact patient only and have patient return for labs results discussion within a week.

## 2016-10-25 ENCOUNTER — Ambulatory Visit (INDEPENDENT_AMBULATORY_CARE_PROVIDER_SITE_OTHER): Payer: BLUE CROSS/BLUE SHIELD | Admitting: Physician Assistant

## 2016-10-25 ENCOUNTER — Encounter: Payer: Self-pay | Admitting: Physician Assistant

## 2016-10-25 VITALS — BP 126/76 | HR 73 | Temp 98.9°F | Resp 18 | Ht 63.0 in | Wt 130.0 lb

## 2016-10-25 DIAGNOSIS — R5383 Other fatigue: Secondary | ICD-10-CM | POA: Diagnosis not present

## 2016-10-25 NOTE — Patient Instructions (Addendum)
Please await contact for the referral.  It is fine to continue the movement stretches, and medication from your PCP      IF you received an x-ray today, you will receive an invoice from [facility]. Please contact [facility] at [phone] with questions or concerns regarding your invoice.   IF you received lab work today, you will receive an invoice from [facility]. Please contact [facility] at [phone] with questions or concerns regarding your invoice.   Our billing staff will not be able to assist you with questions regarding bills from these companies.  You will be contacted with the lab results as soon as they are available. The fastest way to get your results is to activate your My Chart account. Instructions are located on the last page of this paperwork. If you have not heard from [name] regarding the results in 2 weeks, please contact this office.

## 2016-10-25 NOTE — Progress Notes (Signed)
PRIMARY CARE AT [facility] [address], [place] [address] [phone]  Date:  10/25/2016   Name:  Jacqueline Bonilla   DOB:  20-Jun-1955   MRN:  [id number]  PCP:  Patient, No Pcp Per    History of Present Illness:  Jacqueline Bonilla is a 61 y.o. female patient who presents to PCP with  Chief Complaint  Patient presents with  . Lab Results  . Follow-up     She is here today for lab results of her fatigue.  Seen several days ago for fatigue.  Labs are unremarkable.  She has added hydraton to her lifestyle which has helped according to her. There are no active problems to display for this patient.   Past Medical History:  Diagnosis Date  . Dysmenorrhea   . Endometriosis   . Fibroid   . Microhematuria    negative workup  . Osteoporosis     Past Surgical History:  Procedure Laterality Date  . BREAST BIOPSY    . CESAREAN SECTION    . hysteroscopic resection      Social History  Substance Use Topics  . Smoking status: Never Smoker  . Smokeless tobacco: Never Used  . Alcohol use No    Family History  Problem Relation Age of Onset  . Cancer Father        non hodgkin lymphoma & skin  . Heart attack Maternal Grandfather     Allergies  Allergen Reactions  . Floraquin [Iodoquinol]     No cipro or others  . Iohexol      Code: HIVES, Desc: HIVES S/P IVP MANY YRS AGO...NO PROBLEM W/O PREP TODAY/A.CALHOUN, Onset Date: [id number]     Medication list has been reviewed and updated.  Current Outpatient Prescriptions on File Prior to Visit  Medication Sig Dispense Refill  . Ascorbic Acid (VITAMIN C PO) Take by mouth daily.    . cholecalciferol (VITAMIN D) 1000 units tablet Take 1,000 Units by mouth daily.    . COD LIVER OIL PO Take by mouth as needed.     Marland Kitchen MAGNESIUM PO Take by mouth as needed.     . Probiotic Product (PROBIOTIC PO) Take by mouth daily.     No current facility-administered medications on file prior to visit.     ROS ROS otherwise unremarkable  unless listed above.  Physical Examination: BP 126/76 (BP Location: Left Arm, Patient Position: Sitting, Cuff Size: Normal)   Pulse 73   Temp 98.9 F (37.2 C) (Oral)   Resp 18   Ht 5\' 3"  (1.6 m)   Wt 130 lb (59 kg)   LMP 01/02/2003   SpO2 99%   BMI 23.03 kg/m  Ideal Body Weight: Weight in (lb) to have BMI = 25: 140.8  Physical Exam  Constitutional: She is oriented to person, place, and time. She appears well-developed and well-nourished. No distress.  HENT:  Head: Normocephalic and atraumatic.  Right Ear: External ear normal.  Left Ear: External ear normal.  Eyes: Pupils are equal, round, and reactive to light. Conjunctivae and EOM are normal.  Cardiovascular: Normal rate and regular rhythm.   No murmur heard. Pulmonary/Chest: Effort normal. No respiratory distress. She has no wheezes.  Neurological: She is alert and oriented to person, place, and time. No cranial nerve deficit.  Normal mini-mental testing.   Skin: She is not diaphoretic.  Psychiatric: She has a normal mood and affect. Her behavior is normal.     Assessment and Plan: [name]  is a 61 y.o. female who is here today for cc of , Chief Complaint  Patient presents with  . Lab Results  . Follow-up  advised to return if this does not begin to improve. She voiced understanding At this time nothing can be detected , in regards to fatigue.  She will continue her hydration and rtc as needed. Other fatigue  Ivar Drape, PA-C [facility] 11/7/201811:52 PM

## 2016-10-25 NOTE — Telephone Encounter (Signed)
Spoke with pt this morning and informed her that the provider would like for her to make an appointment to go over  labs, she verbalized understand so I transfer her to the schdeling pool to make an appointment.

## 2017-01-02 DIAGNOSIS — H5213 Myopia, bilateral: Secondary | ICD-10-CM | POA: Diagnosis not present

## 2017-04-03 ENCOUNTER — Encounter: Payer: Self-pay | Admitting: Physician Assistant

## 2017-08-08 ENCOUNTER — Ambulatory Visit (INDEPENDENT_AMBULATORY_CARE_PROVIDER_SITE_OTHER): Payer: BLUE CROSS/BLUE SHIELD | Admitting: Physician Assistant

## 2017-08-08 ENCOUNTER — Other Ambulatory Visit: Payer: Self-pay

## 2017-08-08 ENCOUNTER — Encounter: Payer: Self-pay | Admitting: Physician Assistant

## 2017-08-08 VITALS — BP 124/78 | HR 85 | Temp 99.8°F | Resp 20 | Ht 63.78 in | Wt 117.8 lb

## 2017-08-08 DIAGNOSIS — T148XXA Other injury of unspecified body region, initial encounter: Secondary | ICD-10-CM | POA: Diagnosis not present

## 2017-08-08 DIAGNOSIS — Z131 Encounter for screening for diabetes mellitus: Secondary | ICD-10-CM | POA: Diagnosis not present

## 2017-08-08 DIAGNOSIS — Z13228 Encounter for screening for other metabolic disorders: Secondary | ICD-10-CM

## 2017-08-08 DIAGNOSIS — M810 Age-related osteoporosis without current pathological fracture: Secondary | ICD-10-CM

## 2017-08-08 DIAGNOSIS — E7800 Pure hypercholesterolemia, unspecified: Secondary | ICD-10-CM | POA: Diagnosis not present

## 2017-08-08 DIAGNOSIS — Z1389 Encounter for screening for other disorder: Secondary | ICD-10-CM

## 2017-08-08 DIAGNOSIS — G479 Sleep disorder, unspecified: Secondary | ICD-10-CM

## 2017-08-08 DIAGNOSIS — Z0000 Encounter for general adult medical examination without abnormal findings: Secondary | ICD-10-CM

## 2017-08-08 DIAGNOSIS — Z1159 Encounter for screening for other viral diseases: Secondary | ICD-10-CM

## 2017-08-08 DIAGNOSIS — R5383 Other fatigue: Secondary | ICD-10-CM

## 2017-08-08 DIAGNOSIS — Z13220 Encounter for screening for lipoid disorders: Secondary | ICD-10-CM

## 2017-08-08 DIAGNOSIS — Z130 Encounter for screening for diseases of the blood and blood-forming organs and certain disorders involving the immune mechanism: Secondary | ICD-10-CM

## 2017-08-08 NOTE — Progress Notes (Signed)
Jacqueline Bonilla  MRN: [id number] DOB: 04-23-1955  PCP: Patient, No Pcp Per   Chief Complaint  Patient presents with  . Annual Exam    Subjective:  Pt presents to clinic for a CPE.  She has no significant concerns today.  Last dental exam: yearly Last vision exam: wears glasses - last year Last pap: within the last 5 years - does not want today Last mammo: within 5 years - she wants to schedule Last colonoscopy: within the last 5 years - normal -  Vaccinations - UTD  Typical meals for patient: 3 meals - homecooked and out to eat -  Typical beverage choices: water Exercises: 3-5 times per week for 2-5 miles - walks Sleeps: 9-10pm until 5:45am and sometimes sleeps well and sometimes does not - more tired the days after she does not sleep well (trouble staying asleep) - 2x/a night  There are no active problems to display for this patient.   Patient Care Team: Patient, No Pcp Per as PCP - General (General Practice)  Review of Systems  Constitutional: Positive for fatigue (Worse on days she does not sleep well the night before).  HENT: Negative.   Eyes: Negative.   Respiratory: Negative.   Cardiovascular: Negative.   Gastrointestinal: Negative.   Endocrine: Negative.   Genitourinary: Negative.   Musculoskeletal: Negative.   Skin: Negative.   Allergic/Immunologic: Negative.   Neurological: Negative.   Hematological: Negative.   Psychiatric/Behavioral: Positive for sleep disturbance (Can get to sleep but will sometimes wake up multiple times at night -does wake up go to the bathroom but does not feel like this is the cause of her sleep disturbance).     Current Outpatient Medications on File Prior to Visit  Medication Sig Dispense Refill  . cholecalciferol (VITAMIN D) 1000 units tablet Take 1,000 Units by mouth daily.    Marland Kitchen MAGNESIUM PO Take by mouth as needed.      No current facility-administered medications on file prior to visit.     Allergies  Allergen  Reactions  . Floraquin [Iodoquinol]     No cipro or others  . Iohexol      Code: HIVES, Desc: HIVES S/P IVP MANY YRS AGO...NO PROBLEM W/O PREP TODAY/A.CALHOUN, Onset Date: [id number]     Social History   Socioeconomic History  . Marital status: Married    Spouse name: Not on file  . Number of children: 1  . Years of education: Not on file  . Highest education level: Not on file  Occupational History  . Not on file  Social Needs  . Financial resource strain: Not on file  . Food insecurity:    Worry: Not on file    Inability: Not on file  . Transportation needs:    Medical: Not on file    Non-medical: Not on file  Tobacco Use  . Smoking status: Never Smoker  . Smokeless tobacco: Never Used  Substance and Sexual Activity  . Alcohol use: No  . Drug use: No  . Sexual activity: Not Currently    Partners: Male    Comment: husband vasectomy  Lifestyle  . Physical activity:    Days per week: Not on file    Minutes per session: Not on file  . Stress: Not on file  Relationships  . Social connections:    Talks on phone: Not on file    Gets together: Not on file    Attends religious service: Not on file    Active  member of club or organization: Not on file    Attends meetings of clubs or organizations: Not on file    Relationship status: Not on file  Other Topics Concern  . 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 BREAST BIOPSY    . CESAREAN SECTION    . hysteroscopic resection      Family History  Problem Relation Age of Onset  . Cancer Father        non hodgkin lymphoma & skin  . Heart attack Maternal Grandfather      Objective:  BP 124/78 (BP Location: Right Arm, Patient Position: Sitting, Cuff Size: Normal)   Pulse 85   Temp 99.8 F (37.7 C) (Oral)   Resp 20   Ht 5' 3.78" (1.62 m)   Wt  117 lb 12.8 oz (53.4 kg)   LMP 01/02/2003   SpO2 99%   BMI 20.36 kg/m   Physical Exam  Constitutional: She is oriented to person, place, and time. She appears well-developed and well-nourished.  HENT:  Head: Normocephalic and atraumatic.  Right Ear: Hearing, tympanic membrane, external ear and ear canal normal.  Left Ear: Hearing, tympanic membrane, external ear and ear canal normal.  Nose: Nose normal.  Mouth/Throat: Uvula is midline, oropharynx is clear and moist and mucous membranes are normal.  Eyes: Pupils are equal, round, and reactive to light. Conjunctivae, EOM and lids are normal. Right eye exhibits no discharge. Left eye exhibits no discharge.  Neck: Trachea normal and normal range of motion. Neck supple. No thyroid mass and no thyromegaly present.  Cardiovascular: Normal rate, regular rhythm and normal heart sounds.  No murmur heard. Pulmonary/Chest: Effort normal and breath sounds normal. She has no wheezes.  Abdominal: Soft. Normal appearance and bowel sounds are normal. There is no tenderness.  Musculoskeletal: Normal range of motion.  Lymphadenopathy:       Head (right side): No tonsillar, no preauricular, no posterior auricular and no occipital adenopathy present.       Head (left side): No tonsillar, no preauricular, no posterior auricular and no occipital adenopathy present.    She has no cervical adenopathy.       Right: No supraclavicular adenopathy present.       Left: No supraclavicular adenopathy present.  Neurological: She is alert and oriented to person, place, and time. She has normal strength and normal reflexes.  Skin: Skin is warm, dry and intact.  Psychiatric: She has a normal mood and affect. Her speech is normal and behavior is normal. Judgment and thought content normal.  Vitals reviewed.   Wt Readings from Last 3 Encounters:  08/08/17 117 lb 12.8 oz (53.4 kg)  10/25/16 130 lb (59 kg)  10/18/16 127 lb (57.6 kg)     Visual Acuity Screening    Right eye Left eye Both eyes  Without correction:     With correction: 20/30 20/30 20/30     Assessment and Plan :  Annual physical exam  Fatigue, unspecified type - Plan: VITAMIN D 25 Hydroxy (Vit-D Deficiency, Fractures), TSH  Sleep disturbance  Screening for deficiency anemia  Bruising - Plan: CBC with Differential/Platelet  Screening for metabolic disorder - Plan: CMP14+EGFR  Screening for diabetes mellitus - Plan: Hemoglobin A1c  Screening, lipid -  Plan: Lipid panel  Encounter for hepatitis C screening test for low risk patient - Plan: HCV Ab w Reflex to Quant PCR  Screening for blood or protein in urine - Plan: Urinalysis, dipstick only   Check labs, anticipatory guidance given. Health maintenance was not able to be updated due to not having dates of screening tests.  Windell Hummingbird PA-C  [facility] 08/08/2017 8:57 AM  Please note: Portions of this report may have been transcribed using dragon voice recognition software. Every effort was made to ensure accuracy; however, inadvertent computerized transcription errors may be present.

## 2017-08-08 NOTE — Patient Instructions (Addendum)
Sleep - valerian root or melatonin extended release  I will contact you with your lab results as soon as they are available.   If you have not heard from me in 2 weeks, please contact me.  The fastest way to get your results is to register for My Chart (see the instructions on this printout).   IF you received an x-ray today, you will receive an invoice from [facility]. Please contact [facility] at [phone] with questions or concerns regarding your invoice.   IF you received labwork today, you will receive an invoice from [facility]. Please contact [facility] at [phone] with questions or concerns regarding your invoice.   Our billing staff will not be able to assist you with questions regarding bills from these companies.  You will be contacted with the lab results as soon as they are available. The fastest way to get your results is to activate your My Chart account. Instructions are located on the last page of this paperwork. If you have not heard from [name] regarding the results in 2 weeks, please contact this office.    In order for your supplemental calcium to be effective.  Please take calcium either on an empty stomach or with food that does not contain calcium.  Your body is able only to absorb 529m of Calcium at a time from any one source.  Do not take with a MVI with iron because iron does not allow th calcium to be absorbed.  Please take Calcium citrate 5066m2-3x/day.  This supplement should have Vit D in it and if your pills do not please add addition Vit D 400 IU with each dose.  calcium content of some foods  calcium-fortified orange juice - 330-350 mg  calcium-fortified breakfast cereals (1 serving) - 200-400 mg  cheese (1 ounce) - 156-314 mg, depending on variety  cottage cheese, 1% milkfat (1 cup) - 138 mg  milk (1 cup) - nonfat 306 mg, low-fat 290 mg, whole 276 mg, soy milk 93 mg (368 mg if calcium-fortified)  Health Maintenance, Female Adopting a  healthy lifestyle and getting preventive care can go a long way to promote health and wellness. Talk with your health care provider about what schedule of regular examinations is right for you. This is a good chance for you to check in with your provider about disease prevention and staying healthy. In between checkups, there are plenty of things you can do on your own. Experts have done a lot of research about which lifestyle changes and preventive measures are most likely to keep you healthy. Ask your health care provider for more information. Weight and diet Eat a healthy diet  Be sure to include plenty of vegetables, fruits, low-fat dairy products, and lean protein.  Do not eat a lot of foods high in solid fats, added sugars, or salt.  Get regular exercise. This is one of the most important things you can do for your health. ? Most adults should exercise for at least 150 minutes each week. The exercise should increase your heart rate and make you sweat (moderate-intensity exercise). ? Most adults should also do strengthening exercises at least twice a week. This is in addition to the moderate-intensity exercise.  Maintain a healthy weight  Body mass index (BMI) is a measurement that can be used to identify possible weight problems. It estimates body fat based on height and weight. Your health care provider can help determine your BMI and help you achieve or maintain a  healthy weight.  For females 81 years of age and older: ? A BMI below 18.5 is considered underweight. ? A BMI of 18.5 to 24.9 is normal. ? A BMI of 25 to 29.9 is considered overweight. ? A BMI of 30 and above is considered obese.  Watch levels of cholesterol and blood lipids  You should start having your blood tested for lipids and cholesterol at 64 years of age, then have this test every 5 years.  You may need to have your cholesterol levels checked more often if: ? Your lipid or cholesterol levels are high. ? You are  older than 64 years of age. ? You are at high risk for heart disease.  Cancer screening Lung Cancer  Lung cancer screening is recommended for adults 45-48 years old who are at high risk for lung cancer because of a history of smoking.  A yearly low-dose CT scan of the lungs is recommended for people who: ? Currently smoke. ? Have quit within the past 15 years. ? Have at least a 30-pack-year history of smoking. A pack year is smoking an average of one pack of cigarettes a day for 1 year.  Yearly screening should continue until it has been 15 years since you quit.  Yearly screening should stop if you develop a health problem that would prevent you from having lung cancer treatment.  Breast Cancer  Practice breast self-awareness. This means understanding how your breasts normally appear and feel.  It also means doing regular breast self-exams. Let your health care provider know about any changes, no matter how small.  If you are in your 20s or 30s, you should have a clinical breast exam (CBE) by a health care provider every 1-3 years as part of a regular health exam.  If you are 37 or older, have a CBE every year. Also consider having a breast X-ray (mammogram) every year.  If you have a family history of breast cancer, talk to your health care provider about genetic screening.  If you are at high risk for breast cancer, talk to your health care provider about having an MRI and a mammogram every year.  Breast cancer gene (BRCA) assessment is recommended for women who have family members with BRCA-related cancers. BRCA-related cancers include: ? Breast. ? Ovarian. ? Tubal. ? Peritoneal cancers.  Results of the assessment will determine the need for genetic counseling and BRCA1 and BRCA2 testing.  Cervical Cancer Your health care provider may recommend that you be screened regularly for cancer of the pelvic organs (ovaries, uterus, and vagina). This screening involves a pelvic  examination, including checking for microscopic changes to the surface of your cervix (Pap test). You may be encouraged to have this screening done every 3 years, beginning at age 71.  For women ages 49-65, health care providers may recommend pelvic exams and Pap testing every 3 years, or they may recommend the Pap and pelvic exam, combined with testing for human papilloma virus (HPV), every 5 years. Some types of HPV increase your risk of cervical cancer. Testing for HPV may also be done on women of any age with unclear Pap test results.  Other health care providers may not recommend any screening for nonpregnant women who are considered low risk for pelvic cancer and who do not have symptoms. Ask your health care provider if a screening pelvic exam is right for you.  If you have had past treatment for cervical cancer or a condition that could lead to cancer,  you need Pap tests and screening for cancer for at least 20 years after your treatment. If Pap tests have been discontinued, your risk factors (such as having a new sexual partner) need to be reassessed to determine if screening should resume. Some women have medical problems that increase the chance of getting cervical cancer. In these cases, your health care provider may recommend more frequent screening and Pap tests.  Colorectal Cancer  This type of cancer can be detected and often prevented.  Routine colorectal cancer screening usually begins at 64 years of age and continues through 64 years of age.  Your health care provider may recommend screening at an earlier age if you have risk factors for colon cancer.  Your health care provider may also recommend using home test kits to check for hidden blood in the stool.  A small camera at the end of a tube can be used to examine your colon directly (sigmoidoscopy or colonoscopy). This is done to check for the earliest forms of colorectal cancer.  Routine screening usually begins at age  9.  Direct examination of the colon should be repeated every 5-10 years through 64 years of age. However, you may need to be screened more often if early forms of precancerous polyps or small growths are found.  Skin Cancer  Check your skin from head to toe regularly.  Tell your health care provider about any new moles or changes in moles, especially if there is a change in a mole's shape or color.  Also tell your health care provider if you have a mole that is larger than the size of a pencil eraser.  Always use sunscreen. Apply sunscreen liberally and repeatedly throughout the day.  Protect yourself by wearing long sleeves, pants, a wide-brimmed hat, and sunglasses whenever you are outside.  Heart disease, diabetes, and high blood pressure  High blood pressure causes heart disease and increases the risk of stroke. High blood pressure is more likely to develop in: ? People who have blood pressure in the high end of the normal range (130-139/85-89 mm Hg). ? People who are overweight or obese. ? People who are African American.  If you are 43-68 years of age, have your blood pressure checked every 3-5 years. If you are 54 years of age or older, have your blood pressure checked every year. You should have your blood pressure measured twice-once when you are at a hospital or clinic, and once when you are not at a hospital or clinic. Record the average of the two measurements. To check your blood pressure when you are not at a hospital or clinic, you can use: ? An automated blood pressure machine at a pharmacy. ? A home blood pressure monitor.  If you are between 63 years and 65 years old, ask your health care provider if you should take aspirin to prevent strokes.  Have regular diabetes screenings. This involves taking a blood sample to check your fasting blood sugar level. ? If you are at a normal weight and have a low risk for diabetes, have this test once every three years after 64  years of age. ? If you are overweight and have a high risk for diabetes, consider being tested at a younger age or more often. Preventing infection Hepatitis B  If you have a higher risk for hepatitis B, you should be screened for this virus. You are considered at high risk for hepatitis B if: ? You were born in a country where  hepatitis B is common. Ask your health care provider which countries are considered high risk. ? Your parents were born in a high-risk country, and you have not been immunized against hepatitis B (hepatitis B vaccine). ? You have HIV or AIDS. ? You use needles to inject street drugs. ? You live with someone who has hepatitis B. ? You have had sex with someone who has hepatitis B. ? You get hemodialysis treatment. ? You take certain medicines for conditions, including cancer, organ transplantation, and autoimmune conditions.  Hepatitis C  Blood testing is recommended for: ? Everyone born from [birth year] through 1967. ? Anyone with known risk factors for hepatitis C.  Sexually transmitted infections (STIs)  You should be screened for sexually transmitted infections (STIs) including gonorrhea and chlamydia if: ? You are sexually active and are younger than 64 years of age. ? You are older than 64 years of age and your health care provider tells you that you are at risk for this type of infection. ? Your sexual activity has changed since you were last screened and you are at an increased risk for chlamydia or gonorrhea. Ask your health care provider if you are at risk.  If you do not have HIV, but are at risk, it may be recommended that you take a prescription medicine daily to prevent HIV infection. This is called pre-exposure prophylaxis (PrEP). You are considered at risk if: ? You are sexually active and do not regularly use condoms or know the HIV status of your partner(s). ? You take drugs by injection. ? You are sexually active with a partner who has HIV.  Talk  with your health care provider about whether you are at high risk of being infected with HIV. If you choose to begin PrEP, you should first be tested for HIV. You should then be tested every 3 months for as long as you are taking PrEP. Pregnancy  If you are premenopausal and you may become pregnant, ask your health care provider about preconception counseling.  If you may become pregnant, take 400 to 800 micrograms (mcg) of folic acid every day.  If you want to prevent pregnancy, talk to your health care provider about birth control (contraception). Osteoporosis and menopause  Osteoporosis is a disease in which the bones lose minerals and strength with aging. This can result in serious bone fractures. Your risk for osteoporosis can be identified using a bone density scan.  If you are 30 years of age or older, or if you are at risk for osteoporosis and fractures, ask your health care provider if you should be screened.  Ask your health care provider whether you should take a calcium or vitamin D supplement to lower your risk for osteoporosis.  Menopause may have certain physical symptoms and risks.  Hormone replacement therapy may reduce some of these symptoms and risks. Talk to your health care provider about whether hormone replacement therapy is right for you. Follow these instructions at home:  Schedule regular health, dental, and eye exams.  Stay current with your immunizations.  Do not use any tobacco products including cigarettes, chewing tobacco, or electronic cigarettes.  If you are pregnant, do not drink alcohol.  If you are breastfeeding, limit how much and how often you drink alcohol.  Limit alcohol intake to no more than 1 drink per day for nonpregnant women. One drink equals 12 ounces of beer, 5 ounces of wine, or 1 ounces of hard liquor.  Do not use street drugs.  Do not share needles.  Ask your health care provider for help if you need support or information  about quitting drugs.  Tell your health care provider if you often feel depressed.  Tell your health care provider if you have ever been abused or do not feel safe at home. This information is not intended to replace advice given to you by your health care provider. Make sure you discuss any questions you have with your health care provider. Document Released: 07/03/2010 Document Revised: 05/26/2015 Document Reviewed: 09/21/2014 [employer]vier Interactive Patient Education  [employer].

## 2017-08-09 ENCOUNTER — Encounter: Payer: Self-pay | Admitting: Radiology

## 2017-08-09 LAB — CBC WITH DIFFERENTIAL/PLATELET
BASOS ABS: 0.1 10*3/uL (ref 0.0–0.2)
Basos: 1 %
EOS (ABSOLUTE): 0 10*3/uL (ref 0.0–0.4)
Eos: 1 %
HEMOGLOBIN: 13.1 g/dL (ref 11.1–15.9)
Hematocrit: 39.5 % (ref 34.0–46.6)
IMMATURE GRANS (ABS): 0 10*3/uL (ref 0.0–0.1)
IMMATURE GRANULOCYTES: 0 %
LYMPHS: 19 %
Lymphocytes Absolute: 1.2 10*3/uL (ref 0.7–3.1)
MCH: 30.3 pg (ref 26.6–33.0)
MCHC: 33.2 g/dL (ref 31.5–35.7)
MCV: 91 fL (ref 79–97)
MONOCYTES: 9 %
Monocytes Absolute: 0.6 10*3/uL (ref 0.1–0.9)
NEUTROS ABS: 4.5 10*3/uL (ref 1.4–7.0)
NEUTROS PCT: 70 %
Platelets: 131 10*3/uL — ABNORMAL LOW (ref 150–450)
RBC: 4.32 x10E6/uL (ref 3.77–5.28)
RDW: 13.5 % (ref 12.3–15.4)
WBC: 6.4 10*3/uL (ref 3.4–10.8)

## 2017-08-09 LAB — URINALYSIS, DIPSTICK ONLY
BILIRUBIN UA: NEGATIVE
Glucose, UA: NEGATIVE
KETONES UA: NEGATIVE
LEUKOCYTES UA: NEGATIVE
NITRITE UA: NEGATIVE
PH UA: 7 (ref 5.0–7.5)
Protein, UA: NEGATIVE
RBC UA: NEGATIVE
SPEC GRAV UA: 1.007 (ref 1.005–1.030)
Urobilinogen, Ur: 0.2 mg/dL (ref 0.2–1.0)

## 2017-08-09 LAB — CMP14+EGFR
ALBUMIN: 4.6 g/dL (ref 3.6–4.8)
ALT: 14 IU/L (ref 0–32)
AST: 22 IU/L (ref 0–40)
Albumin/Globulin Ratio: 1.8 (ref 1.2–2.2)
Alkaline Phosphatase: 57 IU/L (ref 39–117)
BUN / CREAT RATIO: 12 (ref 12–28)
BUN: 13 mg/dL (ref 8–27)
Bilirubin Total: 0.5 mg/dL (ref 0.0–1.2)
CHLORIDE: 99 mmol/L (ref 96–106)
CO2: 24 mmol/L (ref 20–29)
CREATININE: 1.06 mg/dL — AB (ref 0.57–1.00)
Calcium: 9.8 mg/dL (ref 8.7–10.3)
GFR calc non Af Amer: 56 mL/min/{1.73_m2} — ABNORMAL LOW (ref >59)
GFR, EST AFRICAN AMERICAN: 64 mL/min/{1.73_m2} (ref >59)
GLUCOSE: 104 mg/dL — AB (ref 65–99)
Globulin, Total: 2.5 g/dL (ref 1.5–4.5)
POTASSIUM: 4.2 mmol/L (ref 3.5–5.2)
Sodium: 140 mmol/L (ref 134–144)
TOTAL PROTEIN: 7.1 g/dL (ref 6.0–8.5)

## 2017-08-09 LAB — LIPID PANEL
CHOLESTEROL TOTAL: 184 mg/dL (ref 100–199)
Chol/HDL Ratio: 3.3 ratio (ref 0.0–4.4)
HDL: 55 mg/dL (ref >39)
LDL Calculated: 110 mg/dL — ABNORMAL HIGH (ref 0–99)
Triglycerides: 93 mg/dL (ref 0–149)
VLDL Cholesterol Cal: 19 mg/dL (ref 5–40)

## 2017-08-09 LAB — HEMOGLOBIN A1C
ESTIMATED AVERAGE GLUCOSE: 123 mg/dL
HEMOGLOBIN A1C: 5.9 % — AB (ref 4.8–5.6)

## 2017-08-09 LAB — TSH: TSH: 1.15 u[IU]/mL (ref 0.450–4.500)

## 2017-08-09 LAB — VITAMIN D 25 HYDROXY (VIT D DEFICIENCY, FRACTURES): VIT D 25 HYDROXY: 51.1 ng/mL (ref 30.0–100.0)

## 2017-08-09 LAB — HCV INTERPRETATION

## 2017-08-09 LAB — HCV AB W REFLEX TO QUANT PCR: HCV AB: 0.2 {s_co_ratio} (ref 0.0–0.9)

## 2018-07-08 DIAGNOSIS — H5213 Myopia, bilateral: Secondary | ICD-10-CM | POA: Diagnosis not present

## 2018-08-11 ENCOUNTER — Other Ambulatory Visit: Payer: Self-pay

## 2018-08-11 ENCOUNTER — Ambulatory Visit (INDEPENDENT_AMBULATORY_CARE_PROVIDER_SITE_OTHER): Payer: BC Managed Care – PPO | Admitting: Emergency Medicine

## 2018-08-11 ENCOUNTER — Encounter: Payer: Self-pay | Admitting: Emergency Medicine

## 2018-08-11 VITALS — BP 164/94 | HR 71 | Temp 98.5°F | Resp 16 | Ht 64.0 in | Wt 112.8 lb

## 2018-08-11 DIAGNOSIS — Z0000 Encounter for general adult medical examination without abnormal findings: Secondary | ICD-10-CM | POA: Diagnosis not present

## 2018-08-11 DIAGNOSIS — Z1329 Encounter for screening for other suspected endocrine disorder: Secondary | ICD-10-CM

## 2018-08-11 DIAGNOSIS — Z130 Encounter for screening for diseases of the blood and blood-forming organs and certain disorders involving the immune mechanism: Secondary | ICD-10-CM | POA: Diagnosis not present

## 2018-08-11 DIAGNOSIS — Z13228 Encounter for screening for other metabolic disorders: Secondary | ICD-10-CM | POA: Diagnosis not present

## 2018-08-11 DIAGNOSIS — Z13220 Encounter for screening for lipoid disorders: Secondary | ICD-10-CM

## 2018-08-11 NOTE — Patient Instructions (Addendum)
If you have lab work done today you will be contacted with your lab results within the next 2 weeks.  If you have not heard from [name] then please contact us. The fastest way to get your results is to register for My Chart.   IF you received an x-ray today, you will receive an invoice from [facility]. Please contact [facility] at [phone] with questions or concerns regarding your invoice.   IF you received labwork today, you will receive an invoice from [facility]. Please contact [facility] at [phone] with questions or concerns regarding your invoice.   Our billing staff will not be able to assist you with questions regarding bills from these companies.  You will be contacted with the lab results as soon as they are available. The fastest way to get your results is to activate your My Chart account. Instructions are located on the last page of this paperwork. If you have not heard from [name] regarding the results in 2 weeks, please contact this office.    We recommend that you schedule a mammogram for breast cancer screening. Typically, you do not need a referral to do this. Please contact a local imaging center to schedule your mammogram.  [facility] - [phone]  *ask for the Radiology Department [facility] - [phone] or [phone]  [facility] - [phone] [place] [phone] [facility] - [phone]  *ask for the [facility] - [phone]  *ask for the Radiology Department [facility] - [phone]  *ask for the [facility] - [phone]  Health Maintenance, Female Adopting a healthy lifestyle and getting preventive care are important in promoting health and wellness. Ask your health care provider about:  The right schedule for you to have regular tests and exams.  Things you  can do on your own to prevent diseases and keep yourself healthy. What should I know about diet, weight, and exercise? Eat a healthy diet   Eat a diet that includes plenty of vegetables, fruits, low-fat dairy products, and lean protein.  Do not eat a lot of foods that are high in solid fats, added sugars, or sodium. Maintain a healthy weight Body mass index (BMI) is used to identify weight problems. It estimates body fat based on height and weight. Your health care provider can help determine your BMI and help you achieve or maintain a healthy weight. Get regular exercise Get regular exercise. This is one of the most important things you can do for your health. Most adults should:  Exercise for at least 150 minutes each week. The exercise should increase your heart rate and make you sweat (moderate-intensity exercise).  Do strengthening exercises at least twice a week. This is in addition to the moderate-intensity exercise.  Spend less time sitting. Even light physical activity can be beneficial. Watch cholesterol and blood lipids Have your blood tested for lipids and cholesterol at 65 years of age, then have this test every 5 years. Have your cholesterol levels checked more often if:  Your lipid or cholesterol levels are high.  You are older than 65 years of age.  You are at high risk for heart disease. What should I know about cancer screening? Depending on your health history and family history, you may need to have cancer screening at various ages. This may include screening for:  Breast cancer.  Cervical cancer.  Colorectal cancer.  Skin cancer.  Lung cancer. What should I know about heart disease, diabetes, and high blood pressure? Blood pressure and heart disease  High blood pressure causes heart disease and increases the risk of stroke. This is more likely to develop in people who have high blood pressure readings, are of African descent, or are overweight.  Have  your blood pressure checked: ? Every 3-5 years if you are 62-85 years of age. ? Every year if you are 44 years old or older. Diabetes Have regular diabetes screenings. This checks your fasting blood sugar level. Have the screening done:  Once every three years after age 19 if you are at a normal weight and have a low risk for diabetes.  More often and at a younger age if you are overweight or have a high risk for diabetes. What should I know about preventing infection? Hepatitis B If you have a higher risk for hepatitis B, you should be screened for this virus. Talk with your health care provider to find out if you are at risk for hepatitis B infection. Hepatitis C Testing is recommended for:  Everyone born from [birth year] through 1967.  Anyone with known risk factors for hepatitis C. Sexually transmitted infections (STIs)  Get screened for STIs, including gonorrhea and chlamydia, if: ? You are sexually active and are younger than 65 years of age. ? You are older than 65 years of age and your health care provider tells you that you are at risk for this type of infection. ? Your sexual activity has changed since you were last screened, and you are at increased risk for chlamydia or gonorrhea. Ask your health care provider if you are at risk.  Ask your health care provider about whether you are at high risk for HIV. Your health care provider may recommend a prescription medicine to help prevent HIV infection. If you choose to take medicine to prevent HIV, you should first get tested for HIV. You should then be tested every 3 months for as long as you are taking the medicine. Pregnancy  If you are about to stop having your period (premenopausal) and you may become pregnant, seek counseling before you get pregnant.  Take 400 to 800 micrograms (mcg) of folic acid every day if you become pregnant.  Ask for birth control (contraception) if you want to prevent pregnancy. Osteoporosis and  menopause Osteoporosis is a disease in which the bones lose minerals and strength with aging. This can result in bone fractures. If you are 51 years old or older, or if you are at risk for osteoporosis and fractures, ask your health care provider if you should:  Be screened for bone loss.  Take a calcium or vitamin D supplement to lower your risk of fractures.  Be given hormone replacement therapy (HRT) to treat symptoms of menopause. Follow these instructions at home: Lifestyle  Do not use any products that contain nicotine or tobacco, such as cigarettes, e-cigarettes, and chewing tobacco. If you need help quitting, ask your health care provider.  Do not use street drugs.  Do not share needles.  Ask your health care provider for help if you need support or information about quitting drugs. Alcohol use  Do not drink alcohol if: ? Your health care provider tells you not to drink. ? You are pregnant, may be pregnant, or are planning to become pregnant.  If you drink alcohol: ? Limit how much you use to 0-1 drink a day. ? Limit  intake if you are breastfeeding.  Be aware of how much alcohol is in your drink. In the [place] one drink equals one 12 oz bottle of beer (355 mL), one 5 oz glass of wine (148 mL), or one 1 oz glass of hard liquor (44 mL). General instructions  Schedule regular health, dental, and eye exams.  Stay current with your vaccines.  Tell your health care provider if: ? You often feel depressed. ? You have ever been abused or do not feel safe at home. Summary  Adopting a healthy lifestyle and getting preventive care are important in promoting health and wellness.  Follow your health care provider's instructions about healthy diet, exercising, and getting tested or screened for diseases.  Follow your health care provider's instructions on monitoring your cholesterol and blood pressure. This information is not intended to replace advice given to you by your  health care provider. Make sure you discuss any questions you have with your health care provider. Document Released: 07/03/2010 Document Revised: 12/11/2017 Document Reviewed: 12/11/2017 Elsevier Patient Education  2020 [employer].  How to Take Your Blood Pressure You can take your blood pressure at home with a machine. You may need to check your blood pressure at home:  To check if you have high blood pressure (hypertension).  To check your blood pressure over time.  To make sure your blood pressure medicine is working. Supplies needed: You will need a blood pressure machine, or monitor. You can buy one at a drugstore or online. When choosing one:  Choose one with an arm cuff.  Choose one that wraps around your upper arm. Only one finger should fit between your arm and the cuff.  Do not choose one that measures your blood pressure from your wrist or finger. Your doctor can suggest a monitor. How to prepare Avoid these things for 30 minutes before checking your blood pressure:  Drinking caffeine.  Drinking alcohol.  Eating.  Smoking.  Exercising. Five minutes before checking your blood pressure:  Pee.  Sit in a dining chair. Avoid sitting in a soft couch or armchair.  Be quiet. Do not talk. How to take your blood pressure Follow the instructions that came with your machine. If you have a digital blood pressure monitor, these may be the instructions: 1. Sit up straight. 2. Place your feet on the floor. Do not cross your ankles or legs. 3. Rest your left arm at the level of your heart. You may rest it on a table, desk, or chair. 4. Pull up your shirt sleeve. 5. Wrap the blood pressure cuff around the upper part of your left arm. The cuff should be 1 inch (2.5 cm) above your elbow. It is best to wrap the cuff around bare skin. 6. Fit the cuff snugly around your arm. You should be able to place only one finger between the cuff and your arm. 7. Put the cord inside the  groove of your elbow. 8. Press the power button. 9. Sit quietly while the cuff fills with air and loses air. 10. Write down the numbers on the screen. 11. Wait 2-3 minutes and then repeat steps 1-10. What do the numbers mean? Two numbers make up your blood pressure. The first number is called systolic pressure. The second is called diastolic pressure. An example of a blood pressure reading is "120 over 80" (or 120/80). If you are an adult and do not have a medical condition, use this guide to find out if your blood  pressure is normal: Normal  First number: below 120.  Second number: below 80. Elevated  First number: 120-129.  Second number: below 80. Hypertension stage 1  First number: 130-139.  Second number: 80-89. Hypertension stage 2  First number: 140 or above.  Second number: 20 or above. Your blood pressure is above normal even if only the top or bottom number is above normal. Follow these instructions at home:  Check your blood pressure as often as your doctor tells you to.  Take your monitor to your next doctor's appointment. Your doctor will: ? Make sure you are using it correctly. ? Make sure it is working right.  Make sure you understand what your blood pressure numbers should be.  Tell your doctor if your medicines are causing side effects. Contact a doctor if:  Your blood pressure keeps being high. Get help right away if:  Your first blood pressure number is higher than 180.  Your second blood pressure number is higher than 120. This information is not intended to replace advice given to you by your health care provider. Make sure you discuss any questions you have with your health care provider. Document Released: 12/01/2007 Document Revised: 11/30/2016 Document Reviewed: 05/27/2015 Elsevier Patient Education  2020 [employer].

## 2018-08-11 NOTE — Progress Notes (Signed)
[name] 63 y.o.   Chief Complaint  Patient presents with  . Annual Exam    HISTORY OF PRESENT ILLNESS: This is a 63 y.o. female here for her annual exam. Healthy female with healthy lifestyle. Self health grade: A. Good nutrition and physical activity levels. Adequate sleep. Non-smoker.  No EtOH abuser. Health maintenance reviewed: Up-to-date. No chronic medical problems, on no chronic medications.  HPI   Prior to Admission medications   Medication Sig Start Date End Date Taking? Authorizing Provider  cholecalciferol (VITAMIN D) 1000 units tablet Take 1,000 Units by mouth daily.   Yes [provider]  Cholecalciferol (VITAMIN D3 GUMMIES PO) Take 1,000 mcg by mouth daily.   Yes [provider]  Cyanocobalamin (VITAMIN B 12 PO) Take 1,000 mcg by mouth daily.   Yes [provider]  l-methylfolate-B6-B12 (METANX) 3-35-2 MG TABS tablet Take 1 tablet by mouth daily.   Yes [provider]  MAGNESIUM PO Take by mouth as needed.    Yes [provider]  MELATONIN PO Take 10 mg by mouth daily.   Yes [provider]  Multiple Vitamins-Minerals (AIRBORNE GUMMIES) CHEW Chew by mouth daily.   Yes [provider]  Multiple Vitamins-Minerals (HAIR SKIN AND NAILS FORMULA PO) Take 2,500 mcg by mouth daily.   Yes [provider]  Probiotic Product (PROBIOTIC DAILY PO) Take by mouth daily.   Yes [provider]    Allergies  Allergen Reactions  . Floraquin [Iodoquinol]     No cipro or others  . Iohexol      Code: HIVES, Desc: HIVES S/P IVP MANY YRS AGO...NO PROBLEM W/O PREP TODAY/A.CALHOUN, Onset Date: [id number]   . Quinolones     There are no active problems to display for this patient.   Past Medical History:  Diagnosis Date  . Dysmenorrhea   . Endometriosis   . Fibroid   . Microhematuria    negative workup  . Osteoporosis     Past Surgical History:  Procedure Laterality Date  . BREAST  BIOPSY    . CESAREAN SECTION    . hysteroscopic resection      Social History   Socioeconomic History  . Marital status: Married    Spouse name: Not on file  . Number of children: 1  . Years of education: Not on file  . Highest education level: Not on file  Occupational History  . Not on file  Social Needs  . Financial resource strain: Not on file  . Food insecurity    Worry: Not on file    Inability: Not on file  . Transportation needs    Medical: Not on file    Non-medical: Not on file  Tobacco Use  . Smoking status: Never Smoker  . Smokeless tobacco: Never Used  Substance and Sexual Activity  . Alcohol use: No  . Drug use: No  . Sexual activity: Not Currently    Partners: Male    Comment: husband vasectomy  Lifestyle  . Physical activity    Days per week: Not on file    Minutes per session: Not on file  . Stress: Not on file  Relationships  . Social Herbalist on phone: Not on file    Gets together: Not on file    Attends religious service: Not on file    Active member of club or organization: Not on file    Attends meetings of clubs or organizations: Not on file  Relationship status: Not on file  . Intimate partner violence    Fear of current or ex partner: Not on file    Emotionally abused: Not on file    Physically abused: Not on file    Forced sexual activity: Not on file  Other Topics Concern  . 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 Cancer Father        non hodgkin lymphoma & skin  . Heart attack Maternal Grandfather      Review of Systems  Constitutional: Negative.  Negative for chills, fever and weight loss.  HENT: Negative.  Negative for congestion, nosebleeds and sore throat.   Eyes: Negative.  Negative for blurred vision.  Respiratory: Negative.  Negative for  cough and shortness of breath.   Cardiovascular: Negative.  Negative for chest pain and palpitations.  Gastrointestinal: Negative.  Negative for abdominal pain, diarrhea, nausea and vomiting.  Genitourinary: Negative.  Negative for dysuria and hematuria.  Musculoskeletal: Negative.  Negative for back pain, myalgias and neck pain.  Skin: Negative.  Negative for rash.  Neurological: Negative.  Negative for dizziness and headaches.  Endo/Heme/Allergies: Negative.   All other systems reviewed and are negative.   Vitals:   08/11/18 0913 08/11/18 1011  BP: (!) 183/84 (!) 164/94  Pulse: 71   Resp: 16   Temp: 98.5 F (36.9 C)   SpO2: 100%     Physical Exam Vitals signs reviewed.  Constitutional:      Appearance: Normal appearance.  HENT:     Head: Normocephalic and atraumatic.     Nose: Nose normal.     Mouth/Throat:     Mouth: Mucous membranes are moist.     Pharynx: Oropharynx is clear.  Eyes:     Extraocular Movements: Extraocular movements intact.     Conjunctiva/sclera: Conjunctivae normal.     Pupils: Pupils are equal, round, and reactive to light.  Neck:     Musculoskeletal: Normal range of motion and neck supple.  Cardiovascular:     Rate and Rhythm: Normal rate and regular rhythm.     Pulses: Normal pulses.     Heart sounds: Normal heart sounds.  Pulmonary:     Effort: Pulmonary effort is normal.     Breath sounds: Normal breath sounds.  Abdominal:     General: Bowel sounds are normal. There is no distension.     Palpations: Abdomen is soft. There is no mass.     Tenderness: There is no abdominal tenderness. There is no guarding.  Musculoskeletal: Normal range of motion.  Skin:    General: Skin is warm and dry.     Capillary Refill: Capillary refill takes less than 2 seconds.  Neurological:     General: No focal deficit present.     Mental Status: She is alert and oriented to person, place, and time.  Psychiatric:        Mood and Affect: Mood normal.         Behavior: Behavior normal.      ASSESSMENT & PLAN: Systolic blood pressure elevated.  Normal diastolic pressure.  Advised to monitor blood pressure at home and let me know if the numbers are persistently elevated.   Shayda was seen today for annual  exam.  Diagnoses and all orders for this visit:  Routine general medical examination at a health care facility  Screening for deficiency anemia -     CBC with Differential/Platelet  Screening for lipoid disorders -     Lipid panel  Screening for endocrine, metabolic and immunity disorder -     Comprehensive metabolic panel    Patient Instructions       If you have lab work done today you will be contacted with your lab results within the next 2 weeks.  If you have not heard from [name] then please contact us. The fastest way to get your results is to register for My Chart.   IF you received an x-ray today, you will receive an invoice from [facility]. Please contact [facility] at [phone] with questions or concerns regarding your invoice.   IF you received labwork today, you will receive an invoice from [facility]. Please contact [facility] at [phone] with questions or concerns regarding your invoice.   Our billing staff will not be able to assist you with questions regarding bills from these companies.  You will be contacted with the lab results as soon as they are available. The fastest way to get your results is to activate your My Chart account. Instructions are located on the last page of this paperwork. If you have not heard from [name] regarding the results in 2 weeks, please contact this office.    We recommend that you schedule a mammogram for breast cancer screening. Typically, you do not need a referral to do this. Please contact a local imaging center to schedule your mammogram.  [facility] - [phone]  *ask for the Radiology Department [facility]) -  [phone] or [phone]  [facility] - [phone] [place] [phone] [facility] - [phone]  *ask for the [facility] - [phone]  *ask for the Radiology Department [facility] - [phone]  *ask for the [facility] - [phone]  Health Maintenance, Female Adopting a healthy lifestyle and getting preventive care are important in promoting health and wellness. Ask your health care provider about:  The right schedule for you to have regular tests and exams.  Things you can do on your own to prevent diseases and keep yourself healthy. What should I know about diet, weight, and exercise? Eat a healthy diet   Eat a diet that includes plenty of vegetables, fruits, low-fat dairy products, and lean protein.  Do not eat a lot of foods that are high in solid fats, added sugars, or sodium. Maintain a healthy weight Body mass index (BMI) is used to identify weight problems. It estimates body fat based on height and weight. Your health care provider can help determine your BMI and help you achieve or maintain a healthy weight. Get regular exercise Get regular exercise. This is one of the most important things you can do for your health. Most adults should:  Exercise for at least 150 minutes each week. The exercise should increase your heart rate and make you sweat (moderate-intensity exercise).  Do strengthening exercises at least twice a week. This is in addition to the moderate-intensity exercise.  Spend less time sitting. Even light physical activity can be beneficial. Watch cholesterol and blood lipids Have your blood tested for lipids and cholesterol at 65 years of age, then have this test every 5 years. Have your  cholesterol levels checked more often if:  Your lipid or cholesterol levels are high.  You are older than 65 years of age.  You  are at high risk for heart disease. What should I know about cancer screening? Depending on your health history and family history, you may need to have cancer screening at various ages. This may include screening for:  Breast cancer.  Cervical cancer.  Colorectal cancer.  Skin cancer.  Lung cancer. What should I know about heart disease, diabetes, and high blood pressure? Blood pressure and heart disease  High blood pressure causes heart disease and increases the risk of stroke. This is more likely to develop in people who have high blood pressure readings, are of African descent, or are overweight.  Have your blood pressure checked: ? Every 3-5 years if you are 7-102 years of age. ? Every year if you are 46 years old or older. Diabetes Have regular diabetes screenings. This checks your fasting blood sugar level. Have the screening done:  Once every three years after age 7 if you are at a normal weight and have a low risk for diabetes.  More often and at a younger age if you are overweight or have a high risk for diabetes. What should I know about preventing infection? Hepatitis B If you have a higher risk for hepatitis B, you should be screened for this virus. Talk with your health care provider to find out if you are at risk for hepatitis B infection. Hepatitis C Testing is recommended for:  Everyone born from [birth year] through 1967.  Anyone with known risk factors for hepatitis C. Sexually transmitted infections (STIs)  Get screened for STIs, including gonorrhea and chlamydia, if: ? You are sexually active and are younger than 65 years of age. ? You are older than 65 years of age and your health care provider tells you that you are at risk for this type of infection. ? Your sexual activity has changed since you were last screened, and you are at increased risk for chlamydia or gonorrhea. Ask your health care provider if you are at risk.  Ask your health care provider about  whether you are at high risk for HIV. Your health care provider may recommend a prescription medicine to help prevent HIV infection. If you choose to take medicine to prevent HIV, you should first get tested for HIV. You should then be tested every 3 months for as long as you are taking the medicine. Pregnancy  If you are about to stop having your period (premenopausal) and you may become pregnant, seek counseling before you get pregnant.  Take 400 to 800 micrograms (mcg) of folic acid every day if you become pregnant.  Ask for birth control (contraception) if you want to prevent pregnancy. Osteoporosis and menopause Osteoporosis is a disease in which the bones lose minerals and strength with aging. This can result in bone fractures. If you are 70 years old or older, or if you are at risk for osteoporosis and fractures, ask your health care provider if you should:  Be screened for bone loss.  Take a calcium or vitamin D supplement to lower your risk of fractures.  Be given hormone replacement therapy (HRT) to treat symptoms of menopause. Follow these instructions at home: Lifestyle  Do not use any products that contain nicotine or tobacco, such as cigarettes, e-cigarettes, and chewing tobacco. If you need help quitting, ask your health care provider.  Do not use street drugs.  Do  not share needles.  Ask your health care provider for help if you need support or information about quitting drugs. Alcohol use  Do not drink alcohol if: ? Your health care provider tells you not to drink. ? You are pregnant, may be pregnant, or are planning to become pregnant.  If you drink alcohol: ? Limit how much you use to 0-1 drink a day. ? Limit intake if you are breastfeeding.  Be aware of how much alcohol is in your drink. In the [place] one drink equals one 12 oz bottle of beer (355 mL), one 5 oz glass of wine (148 mL), or one 1 oz glass of hard liquor (44 mL). General instructions  Schedule  regular health, dental, and eye exams.  Stay current with your vaccines.  Tell your health care provider if: ? You often feel depressed. ? You have ever been abused or do not feel safe at home. Summary  Adopting a healthy lifestyle and getting preventive care are important in promoting health and wellness.  Follow your health care provider's instructions about healthy diet, exercising, and getting tested or screened for diseases.  Follow your health care provider's instructions on monitoring your cholesterol and blood pressure. This information is not intended to replace advice given to you by your health care provider. Make sure you discuss any questions you have with your health care provider. Document Released: 07/03/2010 Document Revised: 12/11/2017 Document Reviewed: 12/11/2017 Elsevier Patient Education  2020 [employer].  How to Take Your Blood Pressure You can take your blood pressure at home with a machine. You may need to check your blood pressure at home:  To check if you have high blood pressure (hypertension).  To check your blood pressure over time.  To make sure your blood pressure medicine is working. Supplies needed: You will need a blood pressure machine, or monitor. You can buy one at a drugstore or online. When choosing one:  Choose one with an arm cuff.  Choose one that wraps around your upper arm. Only one finger should fit between your arm and the cuff.  Do not choose one that measures your blood pressure from your wrist or finger. Your doctor can suggest a monitor. How to prepare Avoid these things for 30 minutes before checking your blood pressure:  Drinking caffeine.  Drinking alcohol.  Eating.  Smoking.  Exercising. Five minutes before checking your blood pressure:  Pee.  Sit in a dining chair. Avoid sitting in a soft couch or armchair.  Be quiet. Do not talk. How to take your blood pressure Follow the instructions that came with  your machine. If you have a digital blood pressure monitor, these may be the instructions: 1. Sit up straight. 2. Place your feet on the floor. Do not cross your ankles or legs. 3. Rest your left arm at the level of your heart. You may rest it on a table, desk, or chair. 4. Pull up your shirt sleeve. 5. Wrap the blood pressure cuff around the upper part of your left arm. The cuff should be 1 inch (2.5 cm) above your elbow. It is best to wrap the cuff around bare skin. 6. Fit the cuff snugly around your arm. You should be able to place only one finger between the cuff and your arm. 7. Put the cord inside the groove of your elbow. 8. Press the power button. 9. Sit quietly while the cuff fills with air and loses air. 10. Write down the numbers on the  screen. 11. Wait 2-3 minutes and then repeat steps 1-10. What do the numbers mean? Two numbers make up your blood pressure. The first number is called systolic pressure. The second is called diastolic pressure. An example of a blood pressure reading is "120 over 80" (or 120/80). If you are an adult and do not have a medical condition, use this guide to find out if your blood pressure is normal: Normal  First number: below 120.  Second number: below 80. Elevated  First number: 120-129.  Second number: below 80. Hypertension stage 1  First number: 130-139.  Second number: 80-89. Hypertension stage 2  First number: 140 or above.  Second number: 71 or above. Your blood pressure is above normal even if only the top or bottom number is above normal. Follow these instructions at home:  Check your blood pressure as often as your doctor tells you to.  Take your monitor to your next doctor's appointment. Your doctor will: ? Make sure you are using it correctly. ? Make sure it is working right.  Make sure you understand what your blood pressure numbers should be.  Tell your doctor if your medicines are causing side effects. Contact a  doctor if:  Your blood pressure keeps being high. Get help right away if:  Your first blood pressure number is higher than 180.  Your second blood pressure number is higher than 120. This information is not intended to replace advice given to you by your health care provider. Make sure you discuss any questions you have with your health care provider. Document Released: 12/01/2007 Document Revised: 11/30/2016 Document Reviewed: 05/27/2015 Elsevier Patient Education  2020 [employer] Inc[employer]     Agustina Caroli, MD [facility]

## 2018-08-11 NOTE — Addendum Note (Signed)
Addended by: Davina Poke on: 08/11/2018 10:59 AM   Modules accepted: Orders

## 2018-08-12 ENCOUNTER — Encounter: Payer: Self-pay | Admitting: Emergency Medicine

## 2018-08-12 LAB — CBC WITH DIFFERENTIAL/PLATELET
Basophils Absolute: 0.1 10*3/uL (ref 0.0–0.2)
Basos: 1 %
EOS (ABSOLUTE): 0.1 10*3/uL (ref 0.0–0.4)
Eos: 1 %
Hematocrit: 40.3 % (ref 34.0–46.6)
Hemoglobin: 13.6 g/dL (ref 11.1–15.9)
Immature Grans (Abs): 0 10*3/uL (ref 0.0–0.1)
Immature Granulocytes: 0 %
Lymphocytes Absolute: 1.2 10*3/uL (ref 0.7–3.1)
Lymphs: 20 %
MCH: 30.4 pg (ref 26.6–33.0)
MCHC: 33.7 g/dL (ref 31.5–35.7)
MCV: 90 fL (ref 79–97)
Monocytes Absolute: 0.5 10*3/uL (ref 0.1–0.9)
Monocytes: 8 %
Neutrophils Absolute: 4.3 10*3/uL (ref 1.4–7.0)
Neutrophils: 70 %
Platelets: 162 10*3/uL (ref 150–450)
RBC: 4.47 x10E6/uL (ref 3.77–5.28)
RDW: 12.2 % (ref 11.7–15.4)
WBC: 6.2 10*3/uL (ref 3.4–10.8)

## 2018-08-12 LAB — COMPREHENSIVE METABOLIC PANEL
ALT: 15 IU/L (ref 0–32)
AST: 22 IU/L (ref 0–40)
Albumin/Globulin Ratio: 1.8 (ref 1.2–2.2)
Albumin: 4.8 g/dL (ref 3.8–4.8)
Alkaline Phosphatase: 67 IU/L (ref 39–117)
BUN/Creatinine Ratio: 18 (ref 12–28)
BUN: 18 mg/dL (ref 8–27)
Bilirubin Total: 0.4 mg/dL (ref 0.0–1.2)
CO2: 22 mmol/L (ref 20–29)
Calcium: 10 mg/dL (ref 8.7–10.3)
Chloride: 103 mmol/L (ref 96–106)
Creatinine, Ser: 1.01 mg/dL — ABNORMAL HIGH (ref 0.57–1.00)
GFR calc Af Amer: 68 mL/min/{1.73_m2} (ref >59)
GFR calc non Af Amer: 59 mL/min/{1.73_m2} — ABNORMAL LOW (ref >59)
Globulin, Total: 2.6 g/dL (ref 1.5–4.5)
Glucose: 112 mg/dL — ABNORMAL HIGH (ref 65–99)
Potassium: 3.7 mmol/L (ref 3.5–5.2)
Sodium: 144 mmol/L (ref 134–144)
Total Protein: 7.4 g/dL (ref 6.0–8.5)

## 2018-08-12 LAB — LIPID PANEL
Chol/HDL Ratio: 3.1 ratio (ref 0.0–4.4)
Cholesterol, Total: 199 mg/dL (ref 100–199)
HDL: 65 mg/dL (ref >39)
LDL Calculated: 117 mg/dL — ABNORMAL HIGH (ref 0–99)
Triglycerides: 84 mg/dL (ref 0–149)
VLDL Cholesterol Cal: 17 mg/dL (ref 5–40)

## 2018-08-12 LAB — TSH: TSH: 1.43 u[IU]/mL (ref 0.450–4.500)

## 2018-08-12 LAB — VITAMIN B12: Vitamin B-12: 1365 pg/mL — ABNORMAL HIGH (ref 232–1245)

## 2019-01-02 DIAGNOSIS — Z95818 Presence of other cardiac implants and grafts: Secondary | ICD-10-CM

## 2019-01-02 HISTORY — DX: Presence of other cardiac implants and grafts: Z95.818

## 2019-02-02 DIAGNOSIS — Z20822 Contact with and (suspected) exposure to covid-19: Secondary | ICD-10-CM | POA: Diagnosis not present

## 2019-02-02 DIAGNOSIS — U071 COVID-19: Secondary | ICD-10-CM | POA: Diagnosis not present

## 2019-03-03 ENCOUNTER — Encounter: Payer: Self-pay | Admitting: Emergency Medicine

## 2019-03-03 ENCOUNTER — Other Ambulatory Visit: Payer: Self-pay

## 2019-03-03 ENCOUNTER — Ambulatory Visit (INDEPENDENT_AMBULATORY_CARE_PROVIDER_SITE_OTHER): Payer: BC Managed Care – PPO | Admitting: Emergency Medicine

## 2019-03-03 VITALS — BP 178/112 | HR 78 | Temp 98.3°F | Resp 16 | Ht 64.0 in | Wt 114.0 lb

## 2019-03-03 DIAGNOSIS — I160 Hypertensive urgency: Secondary | ICD-10-CM | POA: Insufficient documentation

## 2019-03-03 DIAGNOSIS — Z20822 Contact with and (suspected) exposure to covid-19: Secondary | ICD-10-CM | POA: Diagnosis not present

## 2019-03-03 DIAGNOSIS — I10 Essential (primary) hypertension: Secondary | ICD-10-CM | POA: Diagnosis not present

## 2019-03-03 MED ORDER — LOSARTAN POTASSIUM 50 MG PO TABS: 50.0000 mg | ORAL_TABLET | Freq: Every day | ORAL | 3 refills | Status: DC

## 2019-03-03 NOTE — Assessment & Plan Note (Addendum)
Elevated blood pressure.  Clinically stable.  No clinical signs of end-organ damage.  No neurological deficits identified.  Will start losartan 50 mg daily.  Advised to monitor blood pressure readings at home daily and keep a log.  ED precautions given.  Blood work done today. Follow-up in 3 months, earlier as needed.

## 2019-03-03 NOTE — Patient Instructions (Addendum)
   If you have lab work done today you will be contacted with your lab results within the next 2 weeks.  If you have not heard from us then please contact us. The fastest way to get your results is to register for My Chart.   IF you received an x-ray today, you will receive an invoice from [facility]. Please contact [facility] at [phone] with questions or concerns regarding your invoice.   IF you received labwork today, you will receive an invoice from [facility]. Please contact [facility] at [phone] with questions or concerns regarding your invoice.   Our billing staff will not be able to assist you with questions regarding bills from these companies.  You will be contacted with the lab results as soon as they are available. The fastest way to get your results is to activate your My Chart account. Instructions are located on the last page of this paperwork. If you have not heard from us regarding the results in 2 weeks, please contact this office.      Hypertension, Adult High blood pressure (hypertension) is when the force of blood pumping through the arteries is too strong. The arteries are the blood vessels that carry blood from the heart throughout the body. Hypertension forces the heart to work harder to pump blood and may cause arteries to become narrow or stiff. Untreated or uncontrolled hypertension can cause a heart attack, heart failure, a stroke, kidney disease, and other problems. A blood pressure reading consists of a higher number over a lower number. Ideally, your blood pressure should be below 120/80. The first ("top") number is called the systolic pressure. It is a measure of the pressure in your arteries as your heart beats. The second ("bottom") number is called the diastolic pressure. It is a measure of the pressure in your arteries as the heart relaxes. What are the causes? The exact cause of this condition is not known. There are some conditions  that result in or are related to high blood pressure. What increases the risk? Some risk factors for high blood pressure are under your control. The following factors may make you more likely to develop this condition:  Smoking.  Having type 2 diabetes mellitus, high cholesterol, or both.  Not getting enough exercise or physical activity.  Being overweight.  Having too much fat, sugar, calories, or salt (sodium) in your diet.  Drinking too much alcohol. Some risk factors for high blood pressure may be difficult or impossible to change. Some of these factors include:  Having chronic kidney disease.  Having a family history of high blood pressure.  Age. Risk increases with age.  Race. You may be at higher risk if you are African American.  Gender. Men are at higher risk than women before age 45. After age 65, women are at higher risk than men.  Having obstructive sleep apnea.  Stress. What are the signs or symptoms? High blood pressure may not cause symptoms. Very high blood pressure (hypertensive crisis) may cause:  Headache.  Anxiety.  Shortness of breath.  Nosebleed.  Nausea and vomiting.  Vision changes.  Severe chest pain.  Seizures. How is this diagnosed? This condition is diagnosed by measuring your blood pressure while you are seated, with your arm resting on a flat surface, your legs uncrossed, and your feet flat on the floor. The cuff of the blood pressure monitor will be placed directly against the skin of your upper arm at the level of your   heart. It should be measured at least twice using the same arm. Certain conditions can cause a difference in blood pressure between your right and left arms. Certain factors can cause blood pressure readings to be lower or higher than normal for a short period of time:  When your blood pressure is higher when you are in a health care provider's office than when you are at home, this is called white coat hypertension.  Most people with this condition do not need medicines.  When your blood pressure is higher at home than when you are in a health care provider's office, this is called masked hypertension. Most people with this condition may need medicines to control blood pressure. If you have a high blood pressure reading during one visit or you have normal blood pressure with other risk factors, you may be asked to:  Return on a different day to have your blood pressure checked again.  Monitor your blood pressure at home for 1 week or longer. If you are diagnosed with hypertension, you may have other blood or imaging tests to help your health care provider understand your overall risk for other conditions. How is this treated? This condition is treated by making healthy lifestyle changes, such as eating healthy foods, exercising more, and reducing your alcohol intake. Your health care provider may prescribe medicine if lifestyle changes are not enough to get your blood pressure under control, and if:  Your systolic blood pressure is above 130.  Your diastolic blood pressure is above 80. Your personal target blood pressure may vary depending on your medical conditions, your age, and other factors. Follow these instructions at home: Eating and drinking   Eat a diet that is high in fiber and potassium, and low in sodium, added sugar, and fat. An example eating plan is called the DASH (Dietary Approaches to Stop Hypertension) diet. To eat this way: ? Eat plenty of fresh fruits and vegetables. Try to fill one half of your plate at each meal with fruits and vegetables. ? Eat whole grains, such as whole-wheat pasta, brown rice, or whole-grain bread. Fill about one fourth of your plate with whole grains. ? Eat or drink low-fat dairy products, such as skim milk or low-fat yogurt. ? Avoid fatty cuts of meat, processed or cured meats, and poultry with skin. Fill about one fourth of your plate with lean proteins, such  as fish, chicken without skin, beans, eggs, or tofu. ? Avoid pre-made and processed foods. These tend to be higher in sodium, added sugar, and fat.  Reduce your daily sodium intake. Most people with hypertension should eat less than 1,500 mg of sodium a day.  Do not drink alcohol if: ? Your health care provider tells you not to drink. ? You are pregnant, may be pregnant, or are planning to become pregnant.  If you drink alcohol: ? Limit how much you use to:  0-1 drink a day for women.  0-2 drinks a day for men. ? Be aware of how much alcohol is in your drink. In the [place] one drink equals one 12 oz bottle of beer (355 mL), one 5 oz glass of wine (148 mL), or one 1 oz glass of hard liquor (44 mL). Lifestyle   Work with your health care provider to maintain a healthy body weight or to lose weight. Ask what an ideal weight is for you.  Get at least 30 minutes of exercise most days of the week. Activities may include walking, swimming,   or biking.  Include exercise to strengthen your muscles (resistance exercise), such as Pilates or lifting weights, as part of your weekly exercise routine. Try to do these types of exercises for 30 minutes at least 3 days a week.  Do not use any products that contain nicotine or tobacco, such as cigarettes, e-cigarettes, and chewing tobacco. If you need help quitting, ask your health care provider.  Monitor your blood pressure at home as told by your health care provider.  Keep all follow-up visits as told by your health care provider. This is important. Medicines  Take over-the-counter and prescription medicines only as told by your health care provider. Follow directions carefully. Blood pressure medicines must be taken as prescribed.  Do not skip doses of blood pressure medicine. Doing this puts you at risk for problems and can make the medicine less effective.  Ask your health care provider about side effects or reactions to medicines that you  should watch for. Contact a health care provider if you:  Think you are having a reaction to a medicine you are taking.  Have headaches that keep coming back (recurring).  Feel dizzy.  Have swelling in your ankles.  Have trouble with your vision. Get help right away if you:  Develop a severe headache or confusion.  Have unusual weakness or numbness.  Feel faint.  Have severe pain in your chest or abdomen.  Vomit repeatedly.  Have trouble breathing. Summary  Hypertension is when the force of blood pumping through your arteries is too strong. If this condition is not controlled, it may put you at risk for serious complications.  Your personal target blood pressure may vary depending on your medical conditions, your age, and other factors. For most people, a normal blood pressure is less than 120/80.  Hypertension is treated with lifestyle changes, medicines, or a combination of both. Lifestyle changes include losing weight, eating a healthy, low-sodium diet, exercising more, and limiting alcohol. This information is not intended to replace advice given to you by your health care provider. Make sure you discuss any questions you have with your health care provider. Document Revised: 08/28/2017 Document Reviewed: 08/28/2017 [employer] Patient Education  2020 [employer]  

## 2019-03-03 NOTE — Progress Notes (Deleted)
Wt Readings from Last 3 Encounters:  03/03/19 114 lb (51.7 kg)  08/11/18 112 lb 12.8 oz (51.2 kg)  08/08/17 117 lb 12.8 oz (53.4 kg)

## 2019-03-03 NOTE — Progress Notes (Signed)
Jacqueline Bonilla 64 y.o.   Chief Complaint  Patient presents with  . hand weakness    RIGHT x 1 year with numbness  . Eye Problem    both x 1 year per pt everything goes dark or light    HISTORY OF PRESENT ILLNESS: This is a 64 y.o. female complaining of fatigue for 1 year, right hand weakness and numbness for 1 year, and bilateral eye problems for 1 year.  Seen by me August 2020 for annual exam and she voiced no concerns then.  Blood work then was unremarkable except for very mild renal insufficiency and borderline elevation of blood pressure. No other complaints or medical concerns today.  Her blood pressure today is very elevated. BP Readings from Last 3 Encounters:  03/03/19 (!) 178/112  08/11/18 (!) 164/94  08/08/17 124/78  No history of chronic medical problems.  On no chronic medications.  Healthy lifestyle. No recent lifestyle changes.   HPI   Prior to Admission medications   Medication Sig Start Date End Date Taking? Authorizing Provider  cholecalciferol (VITAMIN D) 1000 units tablet Take 1,000 Units by mouth daily.   Yes [provider]  Cholecalciferol (VITAMIN D3 GUMMIES PO) Take 1,000 mcg by mouth daily.   Yes [provider]  Cyanocobalamin (VITAMIN B 12 PO) Take 1,000 mcg by mouth daily.   Yes [provider]  MAGNESIUM PO Take by mouth as needed.    Yes [provider]  Multiple Vitamins-Minerals (AIRBORNE GUMMIES) CHEW Chew by mouth daily.   Yes [provider]  Probiotic Product (PROBIOTIC DAILY PO) Take by mouth daily.   Yes [provider]  l-methylfolate-B6-B12 (METANX) 3-35-2 MG TABS tablet Take 1 tablet by mouth daily.    [provider]  losartan (COZAAR) 50 MG tablet Take 1 tablet (50 mg total) by mouth daily. 03/03/19   Horald Pollen, MD  MELATONIN PO Take 10 mg by mouth daily.    [provider]  Multiple Vitamins-Minerals (HAIR SKIN AND NAILS FORMULA PO) Take 2,500 mcg by  mouth daily.    [provider]    Allergies  Allergen Reactions  . Ciprofloxacin Hives  . Floraquin [Iodoquinol]     No cipro or others  . Iohexol      Code: HIVES, Desc: HIVES S/P IVP MANY YRS AGO...NO PROBLEM W/O PREP TODAY/A.CALHOUN, Onset Date: [id number]   . Quinolones     There are no problems to display for this patient.   Past Medical History:  Diagnosis Date  . Anemia   . Dysmenorrhea   . Endometriosis   . Fibroid   . Microhematuria    negative workup  . Osteoporosis     Past Surgical History:  Procedure Laterality Date  . BREAST BIOPSY    . CESAREAN SECTION    . hysteroscopic resection      Social History   Socioeconomic History  . Marital status: Married    Spouse name: Not on file  . Number of children: 1  . Years of education: Not on file  . Highest education level: Not on file  Occupational History  . Occupation: accounting  Tobacco Use  . Smoking status: Never Smoker  . Smokeless tobacco: Never Used  Substance and Sexual Activity  . Alcohol use: No  . Drug use: No  . Sexual activity: Not Currently    Partners: Male    Comment: husband vasectomy  Other Topics Concern  . 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 Difficulty of Paying Living Expenses: Not on file  Food Insecurity:   . Worried About Charity fundraiser in the Last Year: Not on file  . Ran Out of Food in the Last Year: Not on file  Transportation Needs:   . Lack of Transportation (Medical): Not on file  . Lack of Transportation (Non-Medical): Not on file  Physical Activity:   . Days of Exercise per Week: Not on file  . Minutes of Exercise per Session: Not on file  Stress:   . Feeling of Stress : Not on file  Social Connections:   . Frequency of Communication with Friends and Family:  Not on file  . Frequency of Social Gatherings with Friends and Family: Not on file  . Attends Religious Services: Not on file  . Active Member of Clubs or Organizations: Not on file  . Attends Archivist Meetings: Not on file  . Marital Status: Not on file  Intimate Partner Violence:   . Fear of Current or Ex-Partner: Not on file  . Emotionally Abused: Not on file  . Physically Abused: Not on file  . Sexually Abused: Not on file    Family History  Problem Relation Age of Onset  . Cancer Father        non hodgkin lymphoma & skin  . Heart attack Maternal Grandfather      Review of Systems  Constitutional: Positive for malaise/fatigue. Negative for chills and fever.  HENT: Negative.  Negative for congestion and sore throat.   Eyes: Negative for photophobia and pain.  Respiratory: Negative.  Negative for cough and shortness of breath.   Cardiovascular: Negative.  Negative for chest pain and palpitations.  Gastrointestinal: Negative.  Negative for abdominal pain, blood in stool, diarrhea, melena, nausea and vomiting.  Genitourinary: Negative.  Negative for dysuria and hematuria.  Musculoskeletal: Negative.  Negative for joint pain and neck pain.  Skin: Negative.  Negative for rash.  Neurological: Negative.  Negative for dizziness, speech change, focal weakness, loss of consciousness and headaches.  Endo/Heme/Allergies: Negative.   All other systems reviewed and are negative.  Today's Vitals   03/03/19 1523  BP: (!) 178/112  Pulse: 78  Resp: 16  Temp: 98.3 F (36.8 C)  TempSrc: Temporal  SpO2: 99%  Weight: 114 lb (51.7 kg)  Height: 5\' 4"  (1.626 m)   Body mass index is 19.57 kg/m. Wt Readings from Last 3 Encounters:  03/03/19 114 lb (51.7 kg)  08/11/18 112 lb 12.8 oz (51.2 kg)  08/08/17 117 lb 12.8 oz (53.4 kg)     Physical Exam Vitals reviewed.  Constitutional:      Appearance: Normal appearance.  HENT:     Head: Normocephalic.  Eyes:     Extraocular  Movements: Extraocular movements intact.     Conjunctiva/sclera: Conjunctivae normal.     Pupils: Pupils are equal, round, and reactive to light.  Neck:     Vascular: No carotid bruit.  Cardiovascular:     Rate and Rhythm: Normal rate and regular rhythm.     Pulses: Normal pulses.     Heart sounds: Normal heart sounds.  Pulmonary:     Effort: Pulmonary effort is normal.  Breath sounds: Normal breath sounds.  Musculoskeletal:        General: Normal range of motion.     Cervical back: Normal range of motion and neck supple. No tenderness.  Lymphadenopathy:     Cervical: No cervical adenopathy.  Skin:    General: Skin is warm and dry.     Capillary Refill: Capillary refill takes less than 2 seconds.  Neurological:     General: No focal deficit present.     Mental Status: She is alert and oriented to person, place, and time.     Cranial Nerves: No cranial nerve deficit.     Sensory: No sensory deficit.     Motor: No weakness.     Coordination: Coordination normal.     Gait: Gait normal.     Deep Tendon Reflexes: Reflexes normal.  Psychiatric:        Mood and Affect: Mood normal.        Behavior: Behavior normal.    A total of 30 minutes was spent with the patient, greater than 50% of which was in counseling/coordination of care regarding hypertension and cardiovascular risks associated with it, treatment and management, need to start new medication, losartan and side effects, need to monitor blood pressure at home daily, diet and nutrition, need to repeat blood work today, prognosis, and need for follow-up in 3 months.   ASSESSMENT & PLAN: Hypertensive urgency Elevated blood pressure.  Clinically stable.  No clinical signs of end-organ damage.  No neurological deficits identified.  Will start losartan 50 mg daily.  Advised to monitor blood pressure readings at home daily and keep a log.  ED precautions given.  Blood work done today. Follow-up in 3 months, earlier as  needed.  Jacqueline Bonilla was seen today for hand weakness and eye problem.  Diagnoses and all orders for this visit:  Hypertensive urgency  Essential hypertension Comments: New onset Orders: -     Comprehensive metabolic panel -     Lipid panel -     Hemoglobin A1c -     CBC with Differential/Platelet -     losartan (COZAAR) 50 MG tablet; Take 1 tablet (50 mg total) by mouth daily. -     TSH  Exposure to COVID-19 virus -     SAR CoV2 Serology (COVID 19)AB(IGG)IA    Patient Instructions       If you have lab work done today you will be contacted with your lab results within the next 2 weeks.  If you have not heard from [name] then please contact us. The fastest way to get your results is to register for My Chart.   IF you received an x-ray today, you will receive an invoice from [facility]. Please contact [facility] at [phone] with questions or concerns regarding your invoice.   IF you received labwork today, you will receive an invoice from [place]. Please contact [facility] at [phone] with questions or concerns regarding your invoice.   Our billing staff will not be able to assist you with questions regarding bills from these companies.  You will be contacted with the lab results as soon as they are available. The fastest way to get your results is to activate your My Chart account. Instructions are located on the last page of this paperwork. If you have not heard from [name] regarding the results in 2 weeks, please contact this office.     Hypertension, Adult High blood pressure (hypertension) is when the force of blood pumping  through the arteries is too strong. The arteries are the blood vessels that carry blood from the heart throughout the body. Hypertension forces the heart to work harder to pump blood and may cause arteries to become narrow or stiff. Untreated or uncontrolled hypertension can cause a heart attack, heart failure, a stroke, kidney  disease, and other problems. A blood pressure reading consists of a higher number over a lower number. Ideally, your blood pressure should be below 120/80. The first ("top") number is called the systolic pressure. It is a measure of the pressure in your arteries as your heart beats. The second ("bottom") number is called the diastolic pressure. It is a measure of the pressure in your arteries as the heart relaxes. What are the causes? The exact cause of this condition is not known. There are some conditions that result in or are related to high blood pressure. What increases the risk? Some risk factors for high blood pressure are under your control. The following factors may make you more likely to develop this condition:  Smoking.  Having type 2 diabetes mellitus, high cholesterol, or both.  Not getting enough exercise or physical activity.  Being overweight.  Having too much fat, sugar, calories, or salt (sodium) in your diet.  Drinking too much alcohol. Some risk factors for high blood pressure may be difficult or impossible to change. Some of these factors include:  Having chronic kidney disease.  Having a family history of high blood pressure.  Age. Risk increases with age.  Race. You may be at higher risk if you are African American.  Gender. Men are at higher risk than women before age 52. After age 73, women are at higher risk than men.  Having obstructive sleep apnea.  Stress. What are the signs or symptoms? High blood pressure may not cause symptoms. Very high blood pressure (hypertensive crisis) may cause:  Headache.  Anxiety.  Shortness of breath.  Nosebleed.  Nausea and vomiting.  Vision changes.  Severe chest pain.  Seizures. How is this diagnosed? This condition is diagnosed by measuring your blood pressure while you are seated, with your arm resting on a flat surface, your legs uncrossed, and your feet flat on the floor. The cuff of the blood  pressure monitor will be placed directly against the skin of your upper arm at the level of your heart. It should be measured at least twice using the same arm. Certain conditions can cause a difference in blood pressure between your right and left arms. Certain factors can cause blood pressure readings to be lower or higher than normal for a short period of time:  When your blood pressure is higher when you are in a health care provider's office than when you are at home, this is called white coat hypertension. Most people with this condition do not need medicines.  When your blood pressure is higher at home than when you are in a health care provider's office, this is called masked hypertension. Most people with this condition may need medicines to control blood pressure. If you have a high blood pressure reading during one visit or you have normal blood pressure with other risk factors, you may be asked to:  Return on a different day to have your blood pressure checked again.  Monitor your blood pressure at home for 1 week or longer. If you are diagnosed with hypertension, you may have other blood or imaging tests to help your health care provider understand your overall risk  for other conditions. How is this treated? This condition is treated by making healthy lifestyle changes, such as eating healthy foods, exercising more, and reducing your alcohol intake. Your health care provider may prescribe medicine if lifestyle changes are not enough to get your blood pressure under control, and if:  Your systolic blood pressure is above 130.  Your diastolic blood pressure is above 80. Your personal target blood pressure may vary depending on your medical conditions, your age, and other factors. Follow these instructions at home: Eating and drinking   Eat a diet that is high in fiber and potassium, and low in sodium, added sugar, and fat. An example eating plan is called the DASH (Dietary  Approaches to Stop Hypertension) diet. To eat this way: ? Eat plenty of fresh fruits and vegetables. Try to fill one half of your plate at each meal with fruits and vegetables. ? Eat whole grains, such as whole-wheat pasta, brown rice, or whole-grain bread. Fill about one fourth of your plate with whole grains. ? Eat or drink low-fat dairy products, such as skim milk or low-fat yogurt. ? Avoid fatty cuts of meat, processed or cured meats, and poultry with skin. Fill about one fourth of your plate with lean proteins, such as fish, chicken without skin, beans, eggs, or tofu. ? Avoid pre-made and processed foods. These tend to be higher in sodium, added sugar, and fat.  Reduce your daily sodium intake. Most people with hypertension should eat less than 1,500 mg of sodium a day.  Do not drink alcohol if: ? Your health care provider tells you not to drink. ? You are pregnant, may be pregnant, or are planning to become pregnant.  If you drink alcohol: ? Limit how much you use to:  0-1 drink a day for women.  0-2 drinks a day for men. ? Be aware of how much alcohol is in your drink. In the [place] one drink equals one 12 oz bottle of beer (355 mL), one 5 oz glass of wine (148 mL), or one 1 oz glass of hard liquor (44 mL). Lifestyle   Work with your health care provider to maintain a healthy body weight or to lose weight. Ask what an ideal weight is for you.  Get at least 30 minutes of exercise most days of the week. Activities may include walking, swimming, or biking.  Include exercise to strengthen your muscles (resistance exercise), such as Pilates or lifting weights, as part of your weekly exercise routine. Try to do these types of exercises for 30 minutes at least 3 days a week.  Do not use any products that contain nicotine or tobacco, such as cigarettes, e-cigarettes, and chewing tobacco. If you need help quitting, ask your health care provider.  Monitor your blood pressure at home as  told by your health care provider.  Keep all follow-up visits as told by your health care provider. This is important. Medicines  Take over-the-counter and prescription medicines only as told by your health care provider. Follow directions carefully. Blood pressure medicines must be taken as prescribed.  Do not skip doses of blood pressure medicine. Doing this puts you at risk for problems and can make the medicine less effective.  Ask your health care provider about side effects or reactions to medicines that you should watch for. Contact a health care provider if you:  Think you are having a reaction to a medicine you are taking.  Have headaches that keep coming back (recurring).  Feel  dizzy.  Have swelling in your ankles.  Have trouble with your vision. Get help right away if you:  Develop a severe headache or confusion.  Have unusual weakness or numbness.  Feel faint.  Have severe pain in your chest or abdomen.  Vomit repeatedly.  Have trouble breathing. Summary  Hypertension is when the force of blood pumping through your arteries is too strong. If this condition is not controlled, it may put you at risk for serious complications.  Your personal target blood pressure may vary depending on your medical conditions, your age, and other factors. For most people, a normal blood pressure is less than 120/80.  Hypertension is treated with lifestyle changes, medicines, or a combination of both. Lifestyle changes include losing weight, eating a healthy, low-sodium diet, exercising more, and limiting alcohol. This information is not intended to replace advice given to you by your health care provider. Make sure you discuss any questions you have with your health care provider. Document Revised: 08/28/2017 Document Reviewed: 08/28/2017 Elsevier Patient Education  2020 [employer]       Agustina Caroli, MD [facility]

## 2019-03-04 ENCOUNTER — Encounter: Payer: Self-pay | Admitting: Emergency Medicine

## 2019-03-04 LAB — CBC WITH DIFFERENTIAL/PLATELET
Basophils Absolute: 0.1 10*3/uL (ref 0.0–0.2)
Basos: 1 %
EOS (ABSOLUTE): 0.1 10*3/uL (ref 0.0–0.4)
Eos: 1 %
Hematocrit: 38.2 % (ref 34.0–46.6)
Hemoglobin: 12.8 g/dL (ref 11.1–15.9)
Immature Grans (Abs): 0 10*3/uL (ref 0.0–0.1)
Immature Granulocytes: 1 %
Lymphocytes Absolute: 1.9 10*3/uL (ref 0.7–3.1)
Lymphs: 30 %
MCH: 30.1 pg (ref 26.6–33.0)
MCHC: 33.5 g/dL (ref 31.5–35.7)
MCV: 90 fL (ref 79–97)
Monocytes Absolute: 0.5 10*3/uL (ref 0.1–0.9)
Monocytes: 8 %
Neutrophils Absolute: 3.8 10*3/uL (ref 1.4–7.0)
Neutrophils: 59 %
Platelets: 142 10*3/uL — ABNORMAL LOW (ref 150–450)
RBC: 4.25 x10E6/uL (ref 3.77–5.28)
RDW: 12.5 % (ref 11.7–15.4)
WBC: 6.4 10*3/uL (ref 3.4–10.8)

## 2019-03-04 LAB — COMPREHENSIVE METABOLIC PANEL
ALT: 9 IU/L (ref 0–32)
AST: 20 IU/L (ref 0–40)
Albumin/Globulin Ratio: 1.8 (ref 1.2–2.2)
Albumin: 4.5 g/dL (ref 3.8–4.8)
Alkaline Phosphatase: 72 IU/L (ref 39–117)
BUN/Creatinine Ratio: 23 (ref 12–28)
BUN: 19 mg/dL (ref 8–27)
Bilirubin Total: 0.2 mg/dL (ref 0.0–1.2)
CO2: 22 mmol/L (ref 20–29)
Calcium: 9.1 mg/dL (ref 8.7–10.3)
Chloride: 105 mmol/L (ref 96–106)
Creatinine, Ser: 0.84 mg/dL (ref 0.57–1.00)
GFR calc Af Amer: 84 mL/min/{1.73_m2} (ref >59)
GFR calc non Af Amer: 73 mL/min/{1.73_m2} (ref >59)
Globulin, Total: 2.5 g/dL (ref 1.5–4.5)
Glucose: 91 mg/dL (ref 65–99)
Potassium: 4 mmol/L (ref 3.5–5.2)
Sodium: 143 mmol/L (ref 134–144)
Total Protein: 7 g/dL (ref 6.0–8.5)

## 2019-03-04 LAB — LIPID PANEL
Chol/HDL Ratio: 2.6 ratio (ref 0.0–4.4)
Cholesterol, Total: 179 mg/dL (ref 100–199)
HDL: 70 mg/dL (ref >39)
LDL Chol Calc (NIH): 100 mg/dL — ABNORMAL HIGH (ref 0–99)
Triglycerides: 42 mg/dL (ref 0–149)
VLDL Cholesterol Cal: 9 mg/dL (ref 5–40)

## 2019-03-04 LAB — TSH: TSH: 1.4 u[IU]/mL (ref 0.450–4.500)

## 2019-03-04 LAB — SAR COV2 SEROLOGY (COVID19)AB(IGG),IA: DiaSorin SARS-CoV-2 Ab, IgG: POSITIVE

## 2019-03-04 LAB — HEMOGLOBIN A1C
Est. average glucose Bld gHb Est-mCnc: 117 mg/dL
Hgb A1c MFr Bld: 5.7 % — ABNORMAL HIGH (ref 4.8–5.6)

## 2019-03-05 ENCOUNTER — Telehealth: Payer: Self-pay | Admitting: General Practice

## 2019-03-05 NOTE — Telephone Encounter (Signed)
Pt. walked in to drop off FMLA paperwork. $15 charge is processed. Paperwork is left at nurses station in providers box.

## 2019-03-09 ENCOUNTER — Other Ambulatory Visit: Payer: Self-pay | Admitting: Emergency Medicine

## 2019-03-09 DIAGNOSIS — R6889 Other general symptoms and signs: Secondary | ICD-10-CM

## 2019-03-11 ENCOUNTER — Telehealth: Payer: Self-pay | Admitting: *Deleted

## 2019-03-11 ENCOUNTER — Ambulatory Visit: Payer: BC Managed Care – PPO | Admitting: Neurology

## 2019-03-11 ENCOUNTER — Encounter: Payer: Self-pay | Admitting: Neurology

## 2019-03-11 ENCOUNTER — Other Ambulatory Visit: Payer: Self-pay

## 2019-03-11 VITALS — BP 146/92 | HR 83 | Temp 97.5°F | Ht 64.0 in | Wt 114.0 lb

## 2019-03-11 DIAGNOSIS — R200 Anesthesia of skin: Secondary | ICD-10-CM

## 2019-03-11 NOTE — Patient Instructions (Signed)
Electromyoneurogram Electromyoneurogram is a test to check how well your muscles and nerves are working. This procedure includes the combined use of electromyogram (EMG) and nerve conduction study (NCS). EMG is used to look for muscular disorders. NCS, which is also called electroneurogram, measures how well your nerves are controlling your muscles. The procedures are usually done together to check if your muscles and nerves are healthy. If the results of the tests are abnormal, this may indicate disease or injury, such as a neuromuscular disease or peripheral nerve damage. Tell a health care provider about:  Any allergies you have.  All medicines you are taking, including vitamins, herbs, eye drops, creams, and over-the-counter medicines.  Any problems you or family members have had with anesthetic medicines.  Any blood disorders you have.  Any surgeries you have had.  Any medical conditions you have.  If you have a pacemaker.  Whether you are pregnant or may be pregnant. What are the risks? Generally, this is a safe procedure. However, problems may occur, including:  Infection where the electrodes were inserted.  Bleeding. What happens before the procedure? Medicines Ask your health care provider about:  Changing or stopping your regular medicines. This is especially important if you are taking diabetes medicines or blood thinners.  Taking medicines such as aspirin and ibuprofen. These medicines can thin your blood. Do not take these medicines unless your health care provider tells you to take them.  Taking over-the-counter medicines, vitamins, herbs, and supplements. General instructions  Your health care provider may ask you to avoid: ? Beverages that have caffeine, such as coffee and tea. ? Any products that contain nicotine or tobacco. These products include cigarettes, e-cigarettes, and chewing tobacco. If you need help quitting, ask your health care provider.  Do not  use lotions or creams on the same day that you will be having the procedure. What happens during the procedure? For EMG   Your health care provider will ask you to stay in a position so that he or she can access the muscle that will be studied. You may be standing, sitting, or lying down.  You may be given a medicine that numbs the area (local anesthetic).  A very thin needle that has an electrode will be inserted into your muscle.  Another small electrode will be placed on your skin near the muscle.  Your health care provider will ask you to continue to remain still.  The electrodes will send a signal that tells about the electrical activity of your muscles. You may see this on a monitor or hear it in the room.  After your muscles have been studied at rest, your health care provider will ask you to contract or flex your muscles. The electrodes will send a signal that tells about the electrical activity of your muscles.  Your health care provider will remove the electrodes and the electrode needles when the procedure is finished. The procedure may vary among health care providers and hospitals. For NCS   An electrode that records your nerve activity (recording electrode) will be placed on your skin by the muscle that is being studied.  An electrode that is used as a reference (reference electrode) will be placed near the recording electrode.  A paste or gel will be applied to your skin between the recording electrode and the reference electrode.  Your nerve will be stimulated with a mild shock. Your health care provider will measure how much time it takes for your muscle to react.    Your health care provider will remove the electrodes and the gel when the procedure is finished. The procedure may vary among health care providers and hospitals. What happens after the procedure?  It is up to you to get the results of your procedure. Ask your health care provider, or the department  that is doing the procedure, when your results will be ready.  Your health care provider may: ? Give you medicines for any pain. ? Monitor the insertion sites to make sure that bleeding stops. Summary  Electromyoneurogram is a test to check how well your muscles and nerves are working.  If the results of the tests are abnormal, this may indicate disease or injury.  This is a safe procedure. However, problems may occur, such as bleeding and infection.  Your health care provider will do two tests to complete this procedure. One checks your muscles (EMG) and another checks your nerves (NCS).  It is up to you to get the results of your procedure. Ask your health care provider, or the department that is doing the procedure, when your results will be ready. This information is not intended to replace advice given to you by your health care provider. Make sure you discuss any questions you have with your health care provider. Document Revised: 09/03/2017 Document Reviewed: 08/16/2017 Elsevier Patient Education  [place] tunnel syndrome is a condition that causes pain in your hand and arm. The carpal tunnel is a narrow area located on the palm side of your wrist. Repeated wrist motion or certain diseases may cause swelling within the tunnel. This swelling pinches the main nerve in the wrist (median nerve). What are the causes? This condition may be caused by:  Repeated wrist motions.  Wrist injuries.  Arthritis.  A cyst or tumor in the carpal tunnel.  Fluid buildup during pregnancy. Sometimes the cause of this condition is not known. What increases the risk? The following factors may make you more likely to develop this condition:  Having a job, such as being a Research scientist (life sciences), that requires you to repeatedly move your wrist in the same motion.  Being a woman.  Having certain conditions, such as: ? Diabetes. ? Obesity. ? An  underactive thyroid (hypothyroidism). ? Kidney failure. What are the signs or symptoms? Symptoms of this condition include:  A tingling feeling in your fingers, especially in your thumb, index, and middle fingers.  Tingling or numbness in your hand.  An aching feeling in your entire arm, especially when your wrist and elbow are bent for a long time.  Wrist pain that goes up your arm to your shoulder.  Pain that goes down into your palm or fingers.  A weak feeling in your hands. You may have trouble grabbing and holding items. Your symptoms may feel worse during the night. How is this diagnosed? This condition is diagnosed with a medical history and physical exam. You may also have tests, including:  Electromyogram (EMG). This test measures electrical signals sent by your nerves into the muscles.  Nerve conduction study. This test measures how well electrical signals pass through your nerves.  Imaging tests, such as X-rays, ultrasound, and MRI. These tests check for possible causes of your condition. How is this treated? This condition may be treated with:  Lifestyle changes. It is important to stop or change the activity that caused your condition.  Doing exercise and activities to strengthen your muscles and bones (physical therapy).  Learning  how to use your hand again after diagnosis (occupational therapy).  Medicines for pain and inflammation. This may include medicine that is injected into your wrist.  A wrist splint.  Surgery. Follow these instructions at home: If you have a splint:  Wear the splint as told by your health care provider. Remove it only as told by your health care provider.  Loosen the splint if your fingers tingle, become numb, or turn cold and blue.  Keep the splint clean.  If the splint is not waterproof: ? Do not let it get wet. ? Cover it with a watertight covering when you take a bath or shower. Managing pain, stiffness, and  swelling   If directed, put ice on the painful area: ? If you have a removable splint, remove it as told by your health care provider. ? Put ice in a plastic bag. ? Place a towel between your skin and the bag. ? Leave the ice on for 20 minutes, 2-3 times per day. General instructions  Take over-the-counter and prescription medicines only as told by your health care provider.  Rest your wrist from any activity that may be causing your pain. If your condition is work related, talk with your employer about changes that can be made, such as getting a wrist pad to use while typing.  Do any exercises as told by your health care provider, physical therapist, or occupational therapist.  Keep all follow-up visits as told by your health care provider. This is important. Contact a health care provider if:  You have new symptoms.  Your pain is not controlled with medicines.  Your symptoms get worse. Get help right away if:  You have severe numbness or tingling in your wrist or hand. Summary  Carpal tunnel syndrome is a condition that causes pain in your hand and arm.  It is usually caused by repeated wrist motions.  Lifestyle changes and medicines are used to treat carpal tunnel syndrome. Surgery may be recommended.  Follow your health care provider's instructions about wearing a splint, resting from activity, keeping follow-up visits, and calling for help. This information is not intended to replace advice given to you by your health care provider. Make sure you discuss any questions you have with your health care provider. Document Revised: 04/26/2017 Document Reviewed: 04/26/2017 Elsevier Patient Education  [place].

## 2019-03-11 NOTE — Telephone Encounter (Signed)
Spoke to patient about FMLA forms will be completed after further neuro evaluation. When forms are ready I will contact her to pick them up.

## 2019-03-11 NOTE — Telephone Encounter (Signed)
Spoke to patient about the FMLA forms patient stated the reason she was losing sight in the eyes, now she has a new prescription it is much better. Also, she was dropping things, per patient she went to [facility] today. Patient states Dr Heide Spark saw her told her she had carpal tunnel and buy wrist braces. Patient states the stress is not there because she is not working.

## 2019-03-11 NOTE — Progress Notes (Signed)
[facility]    Provider:  Dr Jaynee Eagles Requesting Provider: Horald Pollen, * Primary Care Provider:  Horald Pollen, *  CC:  Hand numbness and weakness  HPI:  Jacqueline Bonilla is a 64 y.o. female here as requested by Horald Pollen, * for forgetfulness. PMHx HTN.  I reviewed Dr. Barry Brunner notes, last appointment (03/03/2019) patient complaining of fatigue for a year, right hand weakness and numbness for 1 year and bilateral eye problems for 1 year, prior appointment blood work was unremarkable except for very mild renal insufficiency and borderline elevation of blood pressure.  Her blood pressure was elevated in the office 178/112.  I reviewed examination which was normal including physical exam and neurologic exam.  Her blood pressure was managed with new medication, labs were ordered, I do not see any documentation in his notes regarding "forgetfulness" this may be in error as patient says she is here for hand numbness.  TSH normal.  CBC CMP unremarkable.  Hemoglobin A1c 5.71-year ago 5.9.  Patient says she is not here for forgetfulness, she denies that, says she was symptomatic with covid but since then back to baseline and she is definitely here for her hands. She is dropping things, started several months ago, right is worse than the left, there is also numbness, hard to tell where the numbness is, stable, she feels it during the night and when she wakes up, no necp pain or radicular symptoms, annoying, she has dropped plates and broken them, no inciting events, nothing makes it better. She types a lot, this makes it worse. No other focal neurologic deficits, associated symptoms, inciting events or modifiable factors.  Reviewed notes, labs and imaging from outside physicians, which showed: see above  Review of Systems: Patient complains of symptoms per HPI as well as the following symptoms: hand numbness. Pertinent negatives and positives per HPI. All  others negative.   Social History   Socioeconomic History  . Marital status: Married    Spouse name: Not on file  . Number of children: 1  . Years of education: Not on file  . Highest education level: Not on file  Occupational History  . Occupation: accounting  Tobacco Use  . Smoking status: Never Smoker  . Smokeless tobacco: Never Used  Substance and Sexual Activity  . Alcohol use: No  . Drug use: No  . Sexual activity: Not Currently    Partners: Male    Comment: husband vasectomy  Other Topics Concern  . 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 Difficulty of Paying Living Expenses: Not on file  Food Insecurity:   . Worried About Charity fundraiser in the Last Year: Not on file  . Ran Out of Food in the Last Year: Not on file  Transportation Needs:   . Lack of Transportation (Medical): Not on file  . Lack of Transportation (Non-Medical): Not on file  Physical Activity:   . Days of Exercise per Week: Not on file  . Minutes of Exercise per Session: Not on file  Stress:   . Feeling of Stress : Not on file  Social  Connections:   . Frequency of Communication with Friends and Family: Not on file  . Frequency of Social Gatherings with Friends and Family: Not on file  . Attends Religious Services: Not on file  . Active Member of Clubs or Organizations: Not on file  . Attends Archivist Meetings: Not on file  . Marital Status: Not on file  Intimate Partner Violence:   . Fear of Current or Ex-Partner: Not on file  . Emotionally Abused: Not on file  . Physically Abused: Not on file  . Sexually Abused: Not on file    Family History  Problem Relation Age of Onset  . Cancer Father        non hodgkin lymphoma & skin  .  Heart attack Maternal Grandfather     Past Medical History:  Diagnosis Date  . Anemia   . Dysmenorrhea   . Endometriosis   . Fibroid   . Microhematuria    negative workup  . Osteoporosis     Patient Active Problem List   Diagnosis Date Noted  . Essential hypertension 03/03/2019  . Hypertensive urgency 03/03/2019    Past Surgical History:  Procedure Laterality Date  . BREAST BIOPSY    . CESAREAN SECTION    . hysteroscopic resection      Current Outpatient Medications  Medication Sig Dispense Refill  . cholecalciferol (VITAMIN D) 1000 units tablet Take 1,000 Units by mouth daily.    . Cholecalciferol (VITAMIN D3 GUMMIES PO) Take 1,000 mcg by mouth daily.    . Cyanocobalamin (VITAMIN B 12 PO) Take 1,000 mcg by mouth daily.    Marland Kitchen l-methylfolate-B6-B12 (METANX) 3-35-2 MG TABS tablet Take 1 tablet by mouth daily.    Marland Kitchen MAGNESIUM PO Take by mouth as needed.     Marland Kitchen MELATONIN PO Take 10 mg by mouth daily.    . Multiple Vitamins-Minerals (AIRBORNE GUMMIES) CHEW Chew by mouth daily.    . Multiple Vitamins-Minerals (HAIR SKIN AND NAILS FORMULA PO) Take 2,500 mcg by mouth daily.    . Probiotic Product (PROBIOTIC DAILY PO) Take by mouth daily.    Marland Kitchen UNABLE TO FIND Med Name: "striction D" (?) for blood pressure    . losartan (COZAAR) 50 MG tablet Take 1 tablet (50 mg total) by mouth daily. (Patient not taking: Reported on 03/11/2019) 90 tablet 3   No current facility-administered medications for this visit.    Allergies as of 03/11/2019 - Review Complete 03/11/2019  Allergen Reaction Noted  . Avelox [moxifloxacin]  03/11/2019  . Ciprofloxacin Hives 03/03/2019  . Floraquin [iodoquinol]  07/30/2012  . Iohexol  08/16/2005  . Levaquin [levofloxacin]  03/11/2019  . Quinolones  08/08/2017    Vitals: BP (!) 146/92 (BP Location: Right Arm, Patient Position: Sitting)   Pulse 83   Temp (!) 97.5 F (36.4 C) Comment: taken at front  Ht 5\' 4"  (1.626 m)   Wt 114 lb (51.7 kg)   LMP  01/02/2003   BMI 19.57 kg/m  Last Weight:  Wt Readings from Last 1 Encounters:  03/11/19 114 lb (51.7 kg)   Last Height:   Ht Readings from Last 1 Encounters:  03/11/19 5\' 4"  (1.626 m)     Physical exam: Exam: Gen: NAD, conversant, well nourised, well groomed, thin                  CV: RRR, no MRG. No Carotid Bruits. No peripheral edema, warm, nontender Eyes: Conjunctivae clear without exudates or hemorrhage  Neuro: Detailed  Neurologic Exam  Speech:    Speech is normal; fluent and spontaneous with normal comprehension.  Cognition:    The patient is oriented to person, place, and time;     recent and remote memory intact;     language fluent;     normal attention, concentration,     fund of knowledge Cranial Nerves:    The pupils are equal, round, and reactive to light. Attempted fundoscopy could not visualize due to small pupils.  Visual fields are full to finger confrontation. Extraocular movements are intact. Trigeminal sensation is intact and the muscles of mastication are normal. The face is symmetric. The palate elevates in the midline. Hearing intact. Voice is normal. Shoulder shrug is normal. The tongue has normal motion without fasciculations.   Coordination:    Normal finger to nose and heel to shin. Normal rapid alternating movements.   Gait:    Normal native gait  Motor Observation:    no involuntary movements noted. No fasciculations. Some generalized atrophy of the muscles in all limbs Tone:    Normal muscle tone.    Posture:    Posture is normal. normal erect    Strength:    Strength is symmetrical in the upper and lower limbs, poor effort, non focal.     Sensation: intact to LT     Reflex Exam:  DTR's:    Deep tendon reflexes in the upper and lower extremities are normal bilaterally.   Toes:    The toes are downgoing bilaterally.   Clonus:    Clonus is absent.    Assessment/Plan:  64 year old with hand numbness and weakness, may be CTS,  emg/ncs scheduled, discussed conservative measures in the meantime.    Orders Placed This Encounter  Procedures  . NCV with EMG(electromyography)    Cc: Horald Pollen, *,    Sarina Ill, MD  [facility] [address] [place], [place] [address]  Phone [phone] Fax [phone]

## 2019-04-01 ENCOUNTER — Telehealth: Payer: Self-pay | Admitting: *Deleted

## 2019-04-01 NOTE — Telephone Encounter (Signed)
Pt called stated she tried to call the neurology place to see if she is sch with hem and they don't have her down for anything. She is just wanting to know what else she need to do. Pt would like a nurse to give her a call back. [phone] Please advise.

## 2019-04-01 NOTE — Telephone Encounter (Signed)
Spoke to the patient advised she had an appointment with Dr Richardo Hanks 03/11/2019 at [facility]. Dr Mitchel Honour will review Dr Cathren Laine notes and complete the forms she left here when he returns in 1-1 1/2 weeks. I will contact her when they are completed. Per patient okay.

## 2019-04-01 NOTE — Telephone Encounter (Signed)
Can you check neurology referral? Pt states they still have not called her

## 2019-04-08 DIAGNOSIS — H40023 Open angle with borderline findings, high risk, bilateral: Secondary | ICD-10-CM | POA: Diagnosis not present

## 2019-04-20 ENCOUNTER — Telehealth: Payer: Self-pay | Admitting: Emergency Medicine

## 2019-04-20 NOTE — Telephone Encounter (Signed)
Pt called regarding her FMLA paperwork she is wanting a call when they are ready for pick up. Forms were dropped off on 3/4. [phone] Please advise.

## 2019-04-22 ENCOUNTER — Other Ambulatory Visit: Payer: Self-pay

## 2019-04-22 ENCOUNTER — Ambulatory Visit (INDEPENDENT_AMBULATORY_CARE_PROVIDER_SITE_OTHER): Payer: BC Managed Care – PPO | Admitting: Emergency Medicine

## 2019-04-22 ENCOUNTER — Encounter: Payer: Self-pay | Admitting: Emergency Medicine

## 2019-04-22 VITALS — BP 148/86 | HR 85 | Temp 97.1°F | Resp 14 | Ht 64.0 in | Wt 116.0 lb

## 2019-04-22 DIAGNOSIS — F488 Other specified nonpsychotic mental disorders: Secondary | ICD-10-CM | POA: Diagnosis not present

## 2019-04-22 DIAGNOSIS — Z8616 Personal history of COVID-19: Secondary | ICD-10-CM | POA: Diagnosis not present

## 2019-04-22 DIAGNOSIS — R4189 Other symptoms and signs involving cognitive functions and awareness: Secondary | ICD-10-CM

## 2019-04-22 DIAGNOSIS — I10 Essential (primary) hypertension: Secondary | ICD-10-CM | POA: Diagnosis not present

## 2019-04-22 NOTE — Progress Notes (Signed)
Jacqueline Bonilla 64 y.o.   Chief Complaint  Patient presents with  . FMLA paperwork    pt needs leave for lingering COVID 19 symptoms, pt still does not have taste back    HISTORY OF PRESENT ILLNESS: This is a 64 y.o. female here to discuss FMLA paperwork.  Seen by me on 03/03/2019 with hypertensive urgency and started on losartan 50 mg daily but not taking medication presently due to side effects.  Blood pressure however has been doing well. FMLA based on Covid illness secondary to exposure at work.  Nasal swab positive on 02/02/2019.  Positive antibody test on 03/03/2019.  States she still has lingering symptoms.  Complaining of "brain fog", intermittent headaches, poor sleep.  Describes brain fog as intermittent episodes of confusion and disorientation.  Husband also concerned about her cognitive decline and forgetfulness. Was seen by neurologist [name] on 03/11/2019 but at the time patient denied any forgetfulness or confusion symptoms.  Instead she was evaluated for chronic hand numbness and weakness.  However she also states that brain fog symptoms may have started as far back as last November.  HPI   Prior to Admission medications   Medication Sig Start Date End Date Taking? Authorizing Provider  Cholecalciferol (VITAMIN D3 GUMMIES PO) Take 1,000 mcg by mouth daily.   Yes [provider]  Cyanocobalamin (VITAMIN B 12 PO) Take 1,000 mcg by mouth daily.   Yes [provider]  MAGNESIUM PO Take by mouth as needed.    Yes [provider]  MELATONIN PO Take 10 mg by mouth daily.   Yes [provider]  Multiple Vitamins-Minerals (AIRBORNE GUMMIES) CHEW Chew by mouth daily.   Yes [provider]  Multiple Vitamins-Minerals (HAIR SKIN AND NAILS FORMULA PO) Take 2,500 mcg by mouth daily.   Yes [provider]  Probiotic Product (PROBIOTIC DAILY PO) Take by mouth daily.   Yes [provider]  UNABLE TO FIND Med Name: "striction D"  (?) for blood pressure   Yes [provider]    Allergies  Allergen Reactions  . Avelox [Moxifloxacin]   . Ciprofloxacin Hives  . Floraquin [Iodoquinol]     No cipro or others  . Iohexol      Code: HIVES, Desc: HIVES S/P IVP MANY YRS AGO...NO PROBLEM W/O PREP TODAY/A.[place], Onset Date: [id number]   . Levaquin [Levofloxacin]   . Quinolones     Patient Active Problem List   Diagnosis Date Noted  . Essential hypertension 03/03/2019  . Hypertensive urgency 03/03/2019    Past Medical History:  Diagnosis Date  . Anemia   . Dysmenorrhea   . Endometriosis   . Fibroid   . Microhematuria    negative workup  . Osteoporosis     Past Surgical History:  Procedure Laterality Date  . BREAST BIOPSY    . CESAREAN SECTION    . hysteroscopic resection      Social History   Socioeconomic History  . Marital status: Married    Spouse name: Not on file  . Number of children: 1  . Years of education: Not on file  . Highest education level: Not on file  Occupational History  . Occupation: accounting  Tobacco Use  . Smoking status: Never Smoker  . Smokeless tobacco: Never Used  Substance and Sexual Activity  . Alcohol use: No  . Drug use: No  . Sexual activity: Not Currently    Partners: Male    Comment: husband vasectomy  Other Topics  Concern  . 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 Difficulty of Paying Living Expenses:   Food Insecurity:   . Worried About Charity fundraiser in the Last Year:   . Arboriculturist in the Last Year:   Transportation Needs:   . Film/video editor (Medical):   Marland Kitchen Lack of Transportation (Non-Medical):   Physical Activity:   . Days of Exercise per Week:   . Minutes of Exercise per  Session:   Stress:   . Feeling of Stress :   Social Connections:   . Frequency of Communication with Friends and Family:   . Frequency of Social Gatherings with Friends and Family:   . Attends Religious Services:   . Active Member of Clubs or Organizations:   . Attends Archivist Meetings:   Marland Kitchen Marital Status:   Intimate Partner Violence:   . Fear of Current or Ex-Partner:   . Emotionally Abused:   Marland Kitchen Physically Abused:   . Sexually Abused:     Family History  Problem Relation Age of Onset  . Cancer Father        non hodgkin lymphoma & skin  . Heart attack Maternal Grandfather      Review of Systems  Constitutional: Negative.  Negative for chills and fever.  HENT: Negative.  Negative for congestion and sore throat.   Respiratory: Negative.  Negative for cough and shortness of breath.   Cardiovascular: Negative.  Negative for chest pain and palpitations.  Gastrointestinal: Negative.  Negative for abdominal pain, diarrhea, nausea and vomiting.  Genitourinary: Negative.  Negative for dysuria and hematuria.  Musculoskeletal: Negative.  Negative for back pain, myalgias and neck pain.  Skin: Negative.  Negative for rash.  Neurological: Negative for dizziness and headaches.  All other systems reviewed and are negative.  Today's Vitals   04/22/19 1007 04/22/19 1014  BP: (!) 152/91 (!) 148/86  Pulse: 85   Resp: 14   Temp: (!) 97.1 F (36.2 C)   TempSrc: Temporal   SpO2: 98%   Weight: 116 lb (52.6 kg)   Height: 5\' 4"  (1.626 m)    Body mass index is 19.91 kg/m.   Physical Exam Vitals reviewed.  Constitutional:      Appearance: Normal appearance.  HENT:     Head: Normocephalic.  Eyes:     Extraocular Movements: Extraocular movements intact.     Conjunctiva/sclera: Conjunctivae normal.     Pupils: Pupils are equal, round, and reactive to light.  Cardiovascular:     Rate and Rhythm: Normal rate and regular rhythm.     Pulses: Normal pulses.     Heart  sounds: Normal heart sounds.  Pulmonary:     Effort: Pulmonary effort is normal.     Breath sounds: Normal breath sounds.  Musculoskeletal:        General: Normal range of motion.     Cervical back: Normal range of motion and neck supple.  Skin:    General: Skin is warm and dry.     Capillary Refill: Capillary refill takes less than 2 seconds.  Neurological:     General:  No focal deficit present.     Mental Status: She is alert and oriented to person, place, and time.  Psychiatric:        Mood and Affect: Mood normal.        Behavior: Behavior normal.    A total of 30 minutes was spent with the patient, greater than 50% of which was in counseling/coordination of care regarding differential diagnosis of her symptoms, need for diagnostic work-up including neurological evaluation, review of most recent office visit notes, review of most recent blood work results, FMLA paperwork, prognosis and need for follow-up.   ASSESSMENT & PLAN: Brain fog Most likely multifactorial including recent Covid viral illness.  Hypertensive condition likely contributing.  Vascular dementia possibly developing.  Refusing to take medication for blood pressure preferring to treat it with natural supplements.  Needs further neurological diagnostic work-up.  Patient agrees with this.    Jacqueline Bonilla was seen today for fmla paperwork.  Diagnoses and all orders for this visit:  Brain fog -     Ambulatory referral to Neurology  Essential hypertension  History of COVID-19    Patient Instructions  Hypertension, Adult High blood pressure (hypertension) is when the force of blood pumping through the arteries is too strong. The arteries are the blood vessels that carry blood from the heart throughout the body. Hypertension forces the heart to work harder to pump blood and may cause arteries to become narrow or stiff. Untreated or uncontrolled hypertension can cause a heart attack, heart failure, a stroke, kidney  disease, and other problems. A blood pressure reading consists of a higher number over a lower number. Ideally, your blood pressure should be below 120/80. The first ("top") number is called the systolic pressure. It is a measure of the pressure in your arteries as your heart beats. The second ("bottom") number is called the diastolic pressure. It is a measure of the pressure in your arteries as the heart relaxes. What are the causes? The exact cause of this condition is not known. There are some conditions that result in or are related to high blood pressure. What increases the risk? Some risk factors for high blood pressure are under your control. The following factors may make you more likely to develop this condition:  Smoking.  Having type 2 diabetes mellitus, high cholesterol, or both.  Not getting enough exercise or physical activity.  Being overweight.  Having too much fat, sugar, calories, or salt (sodium) in your diet.  Drinking too much alcohol. Some risk factors for high blood pressure may be difficult or impossible to change. Some of these factors include:  Having chronic kidney disease.  Having a family history of high blood pressure.  Age. Risk increases with age.  Race. You may be at higher risk if you are African American.  Gender. Men are at higher risk than women before age 19. After age 71, women are at higher risk than men.  Having obstructive sleep apnea.  Stress. What are the signs or symptoms? High blood pressure may not cause symptoms. Very high blood pressure (hypertensive crisis) may cause:  Headache.  Anxiety.  Shortness of breath.  Nosebleed.  Nausea and vomiting.  Vision changes.  Severe chest pain.  Seizures. How is this diagnosed? This condition is diagnosed by measuring your blood pressure while you are seated, with your arm resting on a flat surface, your legs uncrossed, and your feet flat on the floor. The cuff of the blood  pressure monitor will be placed  directly against the skin of your upper arm at the level of your heart. It should be measured at least twice using the same arm. Certain conditions can cause a difference in blood pressure between your right and left arms. Certain factors can cause blood pressure readings to be lower or higher than normal for a short period of time:  When your blood pressure is higher when you are in a health care provider's office than when you are at home, this is called white coat hypertension. Most people with this condition do not need medicines.  When your blood pressure is higher at home than when you are in a health care provider's office, this is called masked hypertension. Most people with this condition may need medicines to control blood pressure. If you have a high blood pressure reading during one visit or you have normal blood pressure with other risk factors, you may be asked to:  Return on a different day to have your blood pressure checked again.  Monitor your blood pressure at home for 1 week or longer. If you are diagnosed with hypertension, you may have other blood or imaging tests to help your health care provider understand your overall risk for other conditions. How is this treated? This condition is treated by making healthy lifestyle changes, such as eating healthy foods, exercising more, and reducing your alcohol intake. Your health care provider may prescribe medicine if lifestyle changes are not enough to get your blood pressure under control, and if:  Your systolic blood pressure is above 130.  Your diastolic blood pressure is above 80. Your personal target blood pressure may vary depending on your medical conditions, your age, and other factors. Follow these instructions at home: Eating and drinking   Eat a diet that is high in fiber and potassium, and low in sodium, added sugar, and fat. An example eating plan is called the DASH (Dietary  Approaches to Stop Hypertension) diet. To eat this way: ? Eat plenty of fresh fruits and vegetables. Try to fill one half of your plate at each meal with fruits and vegetables. ? Eat whole grains, such as whole-wheat pasta, brown rice, or whole-grain bread. Fill about one fourth of your plate with whole grains. ? Eat or drink low-fat dairy products, such as skim milk or low-fat yogurt. ? Avoid fatty cuts of meat, processed or cured meats, and poultry with skin. Fill about one fourth of your plate with lean proteins, such as fish, chicken without skin, beans, eggs, or tofu. ? Avoid pre-made and processed foods. These tend to be higher in sodium, added sugar, and fat.  Reduce your daily sodium intake. Most people with hypertension should eat less than 1,500 mg of sodium a day.  Do not drink alcohol if: ? Your health care provider tells you not to drink. ? You are pregnant, may be pregnant, or are planning to become pregnant.  If you drink alcohol: ? Limit how much you use to:  0-1 drink a day for women.  0-2 drinks a day for men. ? Be aware of how much alcohol is in your drink. In the [place] one drink equals one 12 oz bottle of beer (355 mL), one 5 oz glass of wine (148 mL), or one 1 oz glass of hard liquor (44 mL). Lifestyle   Work with your health care provider to maintain a healthy body weight or to lose weight. Ask what an ideal weight is for you.  Get at least 30 minutes  of exercise most days of the week. Activities may include walking, swimming, or biking.  Include exercise to strengthen your muscles (resistance exercise), such as Pilates or lifting weights, as part of your weekly exercise routine. Try to do these types of exercises for 30 minutes at least 3 days a week.  Do not use any products that contain nicotine or tobacco, such as cigarettes, e-cigarettes, and chewing tobacco. If you need help quitting, ask your health care provider.  Monitor your blood pressure at home as  told by your health care provider.  Keep all follow-up visits as told by your health care provider. This is important. Medicines  Take over-the-counter and prescription medicines only as told by your health care provider. Follow directions carefully. Blood pressure medicines must be taken as prescribed.  Do not skip doses of blood pressure medicine. Doing this puts you at risk for problems and can make the medicine less effective.  Ask your health care provider about side effects or reactions to medicines that you should watch for. Contact a health care provider if you:  Think you are having a reaction to a medicine you are taking.  Have headaches that keep coming back (recurring).  Feel dizzy.  Have swelling in your ankles.  Have trouble with your vision. Get help right away if you:  Develop a severe headache or confusion.  Have unusual weakness or numbness.  Feel faint.  Have severe pain in your chest or abdomen.  Vomit repeatedly.  Have trouble breathing. Summary  Hypertension is when the force of blood pumping through your arteries is too strong. If this condition is not controlled, it may put you at risk for serious complications.  Your personal target blood pressure may vary depending on your medical conditions, your age, and other factors. For most people, a normal blood pressure is less than 120/80.  Hypertension is treated with lifestyle changes, medicines, or a combination of both. Lifestyle changes include losing weight, eating a healthy, low-sodium diet, exercising more, and limiting alcohol. This information is not intended to replace advice given to you by your health care provider. Make sure you discuss any questions you have with your health care provider. Document Revised: 08/28/2017 Document Reviewed: 08/28/2017 Elsevier Patient Education  [place] Maintenance After Age 40 After age 36, you are at a higher risk for certain long-term  diseases and infections as well as injuries from falls. Falls are a major cause of broken bones and head injuries in people who are older than age 88. Getting regular preventive care can help to keep you healthy and well. Preventive care includes getting regular testing and making lifestyle changes as recommended by your health care provider. Talk with your health care provider about:  Which screenings and tests you should have. A screening is a test that checks for a disease when you have no symptoms.  A diet and exercise plan that is right for you. What should I know about screenings and tests to prevent falls? Screening and testing are the best ways to find a health problem early. Early diagnosis and treatment give you the best chance of managing medical conditions that are common after age 71. Certain conditions and lifestyle choices may make you more likely to have a fall. Your health care provider may recommend:  Regular vision checks. Poor vision and conditions such as cataracts can make you more likely to have a fall. If you wear glasses, make sure to get your prescription updated if  your vision changes.  Medicine review. Work with your health care provider to regularly review all of the medicines you are taking, including over-the-counter medicines. Ask your health care provider about any side effects that may make you more likely to have a fall. Tell your health care provider if any medicines that you take make you feel dizzy or sleepy.  Osteoporosis screening. Osteoporosis is a condition that causes the bones to get weaker. This can make the bones weak and cause them to break more easily.  Blood pressure screening. Blood pressure changes and medicines to control blood pressure can make you feel dizzy.  Strength and balance checks. Your health care provider may recommend certain tests to check your strength and balance while standing, walking, or changing positions.  Foot health exam.  Foot pain and numbness, as well as not wearing proper footwear, can make you more likely to have a fall.  Depression screening. You may be more likely to have a fall if you have a fear of falling, feel emotionally low, or feel unable to do activities that you used to do.  Alcohol use screening. Using too much alcohol can affect your balance and may make you more likely to have a fall. What actions can I take to lower my risk of falls? General instructions  Talk with your health care provider about your risks for falling. Tell your health care provider if: ? You fall. Be sure to tell your health care provider about all falls, even ones that seem minor. ? You feel dizzy, sleepy, or off-balance.  Take over-the-counter and prescription medicines only as told by your health care provider. These include any supplements.  Eat a healthy diet and maintain a healthy weight. A healthy diet includes low-fat dairy products, low-fat (lean) meats, and fiber from whole grains, beans, and lots of fruits and vegetables. Home safety  Remove any tripping hazards, such as rugs, cords, and clutter.  Install safety equipment such as grab bars in bathrooms and safety rails on stairs.  Keep rooms and walkways well-lit. Activity   Follow a regular exercise program to stay fit. This will help you maintain your balance. Ask your health care provider what types of exercise are appropriate for you.  If you need a cane or walker, use it as recommended by your health care provider.  Wear supportive shoes that have nonskid soles. Lifestyle  Do not drink alcohol if your health care provider tells you not to drink.  If you drink alcohol, limit how much you have: ? 0-1 drink a day for women. ? 0-2 drinks a day for men.  Be aware of how much alcohol is in your drink. In the [place] one drink equals one typical bottle of beer (12 oz), one-half glass of wine (5 oz), or one shot of hard liquor (1 oz).  Do not use any  products that contain nicotine or tobacco, such as cigarettes and e-cigarettes. If you need help quitting, ask your health care provider. Summary  Having a healthy lifestyle and getting preventive care can help to protect your health and wellness after age 74.  Screening and testing are the best way to find a health problem early and help you avoid having a fall. Early diagnosis and treatment give you the best chance for managing medical conditions that are more common for people who are older than age 31.  Falls are a major cause of broken bones and head injuries in people who are older than age 80.  Take precautions to prevent a fall at home.  Work with your health care provider to learn what changes you can make to improve your health and wellness and to prevent falls. This information is not intended to replace advice given to you by your health care provider. Make sure you discuss any questions you have with your health care provider. Document Revised: 04/10/2018 Document Reviewed: 10/31/2016 Elsevier Patient Education  2020 Else[employer]      Agustina Caroli, MD [facility]

## 2019-04-22 NOTE — Patient Instructions (Signed)
Hypertension, Adult High blood pressure (hypertension) is when the force of blood pumping through the arteries is too strong. The arteries are the blood vessels that carry blood from the heart throughout the body. Hypertension forces the heart to work harder to pump blood and may cause arteries to become narrow or stiff. Untreated or uncontrolled hypertension can cause a heart attack, heart failure, a stroke, kidney disease, and other problems. A blood pressure reading consists of a higher number over a lower number. Ideally, your blood pressure should be below 120/80. The first ("top") number is called the systolic pressure. It is a measure of the pressure in your arteries as your heart beats. The second ("bottom") number is called the diastolic pressure. It is a measure of the pressure in your arteries as the heart relaxes. What are the causes? The exact cause of this condition is not known. There are some conditions that result in or are related to high blood pressure. What increases the risk? Some risk factors for high blood pressure are under your control. The following factors may make you more likely to develop this condition:  Smoking.  Having type 2 diabetes mellitus, high cholesterol, or both.  Not getting enough exercise or physical activity.  Being overweight.  Having too much fat, sugar, calories, or salt (sodium) in your diet.  Drinking too much alcohol. Some risk factors for high blood pressure may be difficult or impossible to change. Some of these factors include:  Having chronic kidney disease.  Having a family history of high blood pressure.  Age. Risk increases with age.  Race. You may be at higher risk if you are African American.  Gender. Men are at higher risk than women before age 45. After age 65, women are at higher risk than men.  Having obstructive sleep apnea.  Stress. What are the signs or symptoms? High blood pressure may not cause symptoms. Very high  blood pressure (hypertensive crisis) may cause:  Headache.  Anxiety.  Shortness of breath.  Nosebleed.  Nausea and vomiting.  Vision changes.  Severe chest pain.  Seizures. How is this diagnosed? This condition is diagnosed by measuring your blood pressure while you are seated, with your arm resting on a flat surface, your legs uncrossed, and your feet flat on the floor. The cuff of the blood pressure monitor will be placed directly against the skin of your upper arm at the level of your heart. It should be measured at least twice using the same arm. Certain conditions can cause a difference in blood pressure between your right and left arms. Certain factors can cause blood pressure readings to be lower or higher than normal for a short period of time:  When your blood pressure is higher when you are in a health care provider's office than when you are at home, this is called white coat hypertension. Most people with this condition do not need medicines.  When your blood pressure is higher at home than when you are in a health care provider's office, this is called masked hypertension. Most people with this condition may need medicines to control blood pressure. If you have a high blood pressure reading during one visit or you have normal blood pressure with other risk factors, you may be asked to:  Return on a different day to have your blood pressure checked again.  Monitor your blood pressure at home for 1 week or longer. If you are diagnosed with hypertension, you may have other blood or   imaging tests to help your health care provider understand your overall risk for other conditions. How is this treated? This condition is treated by making healthy lifestyle changes, such as eating healthy foods, exercising more, and reducing your alcohol intake. Your health care provider may prescribe medicine if lifestyle changes are not enough to get your blood pressure under control, and  if:  Your systolic blood pressure is above 130.  Your diastolic blood pressure is above 80. Your personal target blood pressure may vary depending on your medical conditions, your age, and other factors. Follow these instructions at home: Eating and drinking   Eat a diet that is high in fiber and potassium, and low in sodium, added sugar, and fat. An example eating plan is called the DASH (Dietary Approaches to Stop Hypertension) diet. To eat this way: ? Eat plenty of fresh fruits and vegetables. Try to fill one half of your plate at each meal with fruits and vegetables. ? Eat whole grains, such as whole-wheat pasta, brown rice, or whole-grain bread. Fill about one fourth of your plate with whole grains. ? Eat or drink low-fat dairy products, such as skim milk or low-fat yogurt. ? Avoid fatty cuts of meat, processed or cured meats, and poultry with skin. Fill about one fourth of your plate with lean proteins, such as fish, chicken without skin, beans, eggs, or tofu. ? Avoid pre-made and processed foods. These tend to be higher in sodium, added sugar, and fat.  Reduce your daily sodium intake. Most people with hypertension should eat less than 1,500 mg of sodium a day.  Do not drink alcohol if: ? Your health care provider tells you not to drink. ? You are pregnant, may be pregnant, or are planning to become pregnant.  If you drink alcohol: ? Limit how much you use to:  0-1 drink a day for women.  0-2 drinks a day for men. ? Be aware of how much alcohol is in your drink. In the [place] one drink equals one 12 oz bottle of beer (355 mL), one 5 oz glass of wine (148 mL), or one 1 oz glass of hard liquor (44 mL). Lifestyle   Work with your health care provider to maintain a healthy body weight or to lose weight. Ask what an ideal weight is for you.  Get at least 30 minutes of exercise most days of the week. Activities may include walking, swimming, or biking.  Include exercise to  strengthen your muscles (resistance exercise), such as Pilates or lifting weights, as part of your weekly exercise routine. Try to do these types of exercises for 30 minutes at least 3 days a week.  Do not use any products that contain nicotine or tobacco, such as cigarettes, e-cigarettes, and chewing tobacco. If you need help quitting, ask your health care provider.  Monitor your blood pressure at home as told by your health care provider.  Keep all follow-up visits as told by your health care provider. This is important. Medicines  Take over-the-counter and prescription medicines only as told by your health care provider. Follow directions carefully. Blood pressure medicines must be taken as prescribed.  Do not skip doses of blood pressure medicine. Doing this puts you at risk for problems and can make the medicine less effective.  Ask your health care provider about side effects or reactions to medicines that you should watch for. Contact a health care provider if you:  Think you are having a reaction to a medicine you   are taking.  Have headaches that keep coming back (recurring).  Feel dizzy.  Have swelling in your ankles.  Have trouble with your vision. Get help right away if you:  Develop a severe headache or confusion.  Have unusual weakness or numbness.  Feel faint.  Have severe pain in your chest or abdomen.  Vomit repeatedly.  Have trouble breathing. Summary  Hypertension is when the force of blood pumping through your arteries is too strong. If this condition is not controlled, it may put you at risk for serious complications.  Your personal target blood pressure may vary depending on your medical conditions, your age, and other factors. For most people, a normal blood pressure is less than 120/80.  Hypertension is treated with lifestyle changes, medicines, or a combination of both. Lifestyle changes include losing weight, eating a healthy, low-sodium diet,  exercising more, and limiting alcohol. This information is not intended to replace advice given to you by your health care provider. Make sure you discuss any questions you have with your health care provider. Document Revised: 08/28/2017 Document Reviewed: 08/28/2017 Elsevier Patient Education  Horine Maintenance After Age 3 After age 77, you are at a higher risk for certain long-term diseases and infections as well as injuries from falls. Falls are a major cause of broken bones and head injuries in people who are older than age 57. Getting regular preventive care can help to keep you healthy and well. Preventive care includes getting regular testing and making lifestyle changes as recommended by your health care provider. Talk with your health care provider about:  Which screenings and tests you should have. A screening is a test that checks for a disease when you have no symptoms.  A diet and exercise plan that is right for you. What should I know about screenings and tests to prevent falls? Screening and testing are the best ways to find a health problem early. Early diagnosis and treatment give you the best chance of managing medical conditions that are common after age 52. Certain conditions and lifestyle choices may make you more likely to have a fall. Your health care provider may recommend:  Regular vision checks. Poor vision and conditions such as cataracts can make you more likely to have a fall. If you wear glasses, make sure to get your prescription updated if your vision changes.  Medicine review. Work with your health care provider to regularly review all of the medicines you are taking, including over-the-counter medicines. Ask your health care provider about any side effects that may make you more likely to have a fall. Tell your health care provider if any medicines that you take make you feel dizzy or sleepy.  Osteoporosis screening. Osteoporosis is a  condition that causes the bones to get weaker. This can make the bones weak and cause them to break more easily.  Blood pressure screening. Blood pressure changes and medicines to control blood pressure can make you feel dizzy.  Strength and balance checks. Your health care provider may recommend certain tests to check your strength and balance while standing, walking, or changing positions.  Foot health exam. Foot pain and numbness, as well as not wearing proper footwear, can make you more likely to have a fall.  Depression screening. You may be more likely to have a fall if you have a fear of falling, feel emotionally low, or feel unable to do activities that you used to do.  Alcohol use screening. Using too much  alcohol can affect your balance and may make you more likely to have a fall. What actions can I take to lower my risk of falls? General instructions  Talk with your health care provider about your risks for falling. Tell your health care provider if: ? You fall. Be sure to tell your health care provider about all falls, even ones that seem minor. ? You feel dizzy, sleepy, or off-balance.  Take over-the-counter and prescription medicines only as told by your health care provider. These include any supplements.  Eat a healthy diet and maintain a healthy weight. A healthy diet includes low-fat dairy products, low-fat (lean) meats, and fiber from whole grains, beans, and lots of fruits and vegetables. Home safety  Remove any tripping hazards, such as rugs, cords, and clutter.  Install safety equipment such as grab bars in bathrooms and safety rails on stairs.  Keep rooms and walkways well-lit. Activity   Follow a regular exercise program to stay fit. This will help you maintain your balance. Ask your health care provider what types of exercise are appropriate for you.  If you need a cane or walker, use it as recommended by your health care provider.  Wear supportive shoes  that have nonskid soles. Lifestyle  Do not drink alcohol if your health care provider tells you not to drink.  If you drink alcohol, limit how much you have: ? 0-1 drink a day for women. ? 0-2 drinks a day for men.  Be aware of how much alcohol is in your drink. In the [place] one drink equals one typical bottle of beer (12 oz), one-half glass of wine (5 oz), or one shot of hard liquor (1 oz).  Do not use any products that contain nicotine or tobacco, such as cigarettes and e-cigarettes. If you need help quitting, ask your health care provider. Summary  Having a healthy lifestyle and getting preventive care can help to protect your health and wellness after age 62.  Screening and testing are the best way to find a health problem early and help you avoid having a fall. Early diagnosis and treatment give you the best chance for managing medical conditions that are more common for people who are older than age 5.  Falls are a major cause of broken bones and head injuries in people who are older than age 59. Take precautions to prevent a fall at home.  Work with your health care provider to learn what changes you can make to improve your health and wellness and to prevent falls. This information is not intended to replace advice given to you by your health care provider. Make sure you discuss any questions you have with your health care provider. Document Revised: 04/10/2018 Document Reviewed: 10/31/2016 Elsevier Patient Education  2020 [employer].

## 2019-04-22 NOTE — Telephone Encounter (Signed)
Patient came by the office on 04/21/2019 to complete section 2 of the form, but the information on the section was incorrect. The patient was advised to schedule an appointment with Dr Mitchel Honour to review paper work. Patient was seen in the office today.

## 2019-04-22 NOTE — Assessment & Plan Note (Addendum)
Most likely multifactorial including recent Covid viral illness.  Hypertensive condition likely contributing.  Vascular dementia possibly developing.  Refusing to take medication for blood pressure preferring to treat it with natural supplements.  Needs further neurological diagnostic work-up.  Patient agrees with this.

## 2019-06-03 ENCOUNTER — Ambulatory Visit: Payer: BC Managed Care – PPO | Admitting: Emergency Medicine

## 2019-06-11 ENCOUNTER — Ambulatory Visit: Payer: BC Managed Care – PPO | Admitting: Emergency Medicine

## 2019-06-11 ENCOUNTER — Other Ambulatory Visit: Payer: Self-pay

## 2019-06-11 ENCOUNTER — Encounter: Payer: Self-pay | Admitting: Neurology

## 2019-06-11 ENCOUNTER — Ambulatory Visit: Payer: BC Managed Care – PPO | Admitting: Neurology

## 2019-06-11 ENCOUNTER — Encounter: Payer: Self-pay | Admitting: Emergency Medicine

## 2019-06-11 VITALS — BP 120/76 | HR 94 | Temp 98.4°F | Resp 16 | Ht 64.0 in | Wt 113.0 lb

## 2019-06-11 VITALS — BP 161/94 | HR 78 | Ht 64.0 in | Wt 112.0 lb

## 2019-06-11 DIAGNOSIS — R4189 Other symptoms and signs involving cognitive functions and awareness: Secondary | ICD-10-CM

## 2019-06-11 DIAGNOSIS — R413 Other amnesia: Secondary | ICD-10-CM | POA: Diagnosis not present

## 2019-06-11 DIAGNOSIS — F488 Other specified nonpsychotic mental disorders: Secondary | ICD-10-CM | POA: Diagnosis not present

## 2019-06-11 DIAGNOSIS — E538 Deficiency of other specified B group vitamins: Secondary | ICD-10-CM | POA: Diagnosis not present

## 2019-06-11 DIAGNOSIS — I10 Essential (primary) hypertension: Secondary | ICD-10-CM | POA: Diagnosis not present

## 2019-06-11 DIAGNOSIS — Z8616 Personal history of COVID-19: Secondary | ICD-10-CM | POA: Diagnosis not present

## 2019-06-11 DIAGNOSIS — M818 Other osteoporosis without current pathological fracture: Secondary | ICD-10-CM | POA: Diagnosis not present

## 2019-06-11 DIAGNOSIS — R270 Ataxia, unspecified: Secondary | ICD-10-CM

## 2019-06-11 DIAGNOSIS — D649 Anemia, unspecified: Secondary | ICD-10-CM | POA: Diagnosis not present

## 2019-06-11 DIAGNOSIS — W19XXXA Unspecified fall, initial encounter: Secondary | ICD-10-CM

## 2019-06-11 DIAGNOSIS — R4689 Other symptoms and signs involving appearance and behavior: Secondary | ICD-10-CM

## 2019-06-11 DIAGNOSIS — G3109 Other frontotemporal dementia: Secondary | ICD-10-CM

## 2019-06-11 NOTE — Progress Notes (Addendum)
[facility]    Provider:  Dr Jaynee Eagles Requesting Provider: Horald Pollen, * Primary Care Provider:  Horald Pollen, MD  CC:  Brain Fog  HPI:  Jacqueline Bonilla is a 64 y.o. female here as requested by Horald Pollen, * for "Brain Fog"  PMHx thrombocytopenia, tachycardia, osteoporosis, hypertension, dementia in her mother, depression, anxiety, anemia. Per patient: She had Covid in March. She felt like she had brain fog, cognitive decline, memory loss, she denies balance problems, tingling, balance problems, she has fallen twice. Tingling in the hands, moves legs a lot but not worse. She feels like she is getting better however. She was working up until [employer] and denies stress, depression or anxiety, no hx of psychiatric problems in life. No FHx of psychiatric issues. Mothr has dementia, she is in her 7s, she started showing signs in her 33s she is in long-term care right now. Unknown type. Generally feel happy. She fell a few times, started falling, 7 weeks ago she was walking and fell, unknown reason, she has fallen other times as well. A few weeks ago she fell again in a park, she states it was very hot both times however she tripped and fell and broke her fall with her elbows and hands, numbness and tingling in the hands and dropping things.   Husband provides most information: 3.5 years ago husband noticed paranoia, she claimed people were conspiring against her, she still believed in conspiracies and neighbor was involved, she said her phone was bugged. 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 No significant short term memory at the time, she still has delusions and husband used to fight with her but now she represses them. Lots of personality changes, antagonistic, beligerant, also  short-term memory loss and loss of executive function, she is swearing. A gentleman was helping and she started screaming at him 3 years ago and this is not like her at all she had a big heart and would help people and offer people drinks and this is not like her. Slowly progressive. 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 No alcohol use, no cigarette smoking. Also ataxia, she leans to the right and forward and the right arm and leg flares out. She couldn't find her way out of pennies. She lost her smell and taste after covid. No rem sleep disorder.   Reviewed notes, labs and imaging from outside physicians, which showed:  TSH 1.4  Review of Systems: Patient complains of symptoms per HPI as well as the following symptoms: brain fog, falls, cognitive decline. Pertinent negatives and positives per HPI. All others negative.   Social History   Socioeconomic History  . Marital status: Married    Spouse name: Not on file  . Number of children: 1  . Years of education: Not on file  . Highest education level: Not on file  Occupational History  . Occupation: accounting  Tobacco Use  . Smoking status: Never Smoker  . Smokeless tobacco: Never Used  Vaping Use  . Vaping Use: Never used  Substance and Sexual Activity  . Alcohol use: Never  . Drug use: Never  . Sexual activity: Not Currently    Partners: Male  Comment: husband vasectomy  Other Topics Concern  . 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 Difficulty of Paying Living Expenses:   Food Insecurity:   . Worried About Charity fundraiser in the Last Year:   . Arboriculturist in the Last Year:     Transportation Needs:   . Film/video editor (Medical):   Marland Kitchen Lack of Transportation (Non-Medical):   Physical Activity:   . Days of Exercise per Week:   . Minutes of Exercise per Session:   Stress:   . Feeling of Stress :   Social Connections:   . Frequency of Communication with Friends and Family:   . Frequency of Social Gatherings with Friends and Family:   . Attends Religious Services:   . Active Member of Clubs or Organizations:   . Attends Archivist Meetings:   Marland Kitchen Marital Status:   Intimate Partner Violence:   . Fear of Current or Ex-Partner:   . Emotionally Abused:   Marland Kitchen Physically Abused:   . Sexually Abused:     Family History  Problem Relation Age of Onset  . Cancer Father        non hodgkin lymphoma & skin  . Heart attack Maternal Grandfather   . Dementia Mother     Past Medical History:  Diagnosis Date  . Anemia   . Anxiety   . Depression   . Dysmenorrhea   . Endometriosis   . Fibroid   . Hypertension   . Microhematuria    negative workup  . Osteoporosis   . Tachycardia   . Thrombocytopenia [facility])     Patient Active Problem List   Diagnosis Date Noted  . Brain fog 04/22/2019  . History of COVID-19 04/22/2019  . Essential hypertension 03/03/2019  . Hypertensive urgency 03/03/2019    Past Surgical History:  Procedure Laterality Date  . BREAST BIOPSY    . CESAREAN SECTION    . hysteroscopic resection      Current Outpatient Medications  Medication Sig Dispense Refill  . Cholecalciferol (VITAMIN D3 GUMMIES PO) Take 1,000 mcg by mouth daily.    . Cyanocobalamin (VITAMIN B 12 PO) Take 1,000 mcg by mouth daily.    Marland Kitchen MAGNESIUM PO Take by mouth as needed.     Marland Kitchen MELATONIN PO Take 10 mg by mouth daily.    . Multiple Vitamins-Minerals (AIRBORNE GUMMIES) CHEW Chew by mouth daily.    . Multiple Vitamins-Minerals (HAIR SKIN AND NAILS FORMULA PO) Take 2,500 mcg by mouth daily.    . Probiotic Product (PROBIOTIC DAILY PO) Take by mouth daily.     Marland Kitchen UNABLE TO FIND Med Name: "striction D" (?) for blood pressure (Patient not taking: Reported on 06/11/2019)     No current facility-administered medications for this visit.    Allergies as of 06/11/2019 - Review Complete 06/11/2019  Allergen Reaction Noted  . Avelox [moxifloxacin]  03/11/2019  . Ciprofloxacin Hives 03/03/2019  . Floraquin [iodoquinol]  07/30/2012  . Iohexol  08/16/2005  . Levaquin [levofloxacin]  03/11/2019  . Quinolones  08/08/2017    Vitals: BP (!) 161/94 (BP Location: Right  Arm, Patient Position: Sitting) Comment: pt nervous  Pulse 78   Ht 5\' 4"  (1.626 m)   Wt 112 lb (50.8 kg)   LMP 01/02/2003   BMI 19.22 kg/m  Last Weight:  Wt Readings from Last 1 Encounters:  06/11/19 113 lb (51.3 kg)   Last Height:   Ht Readings from Last 1 Encounters:  06/11/19 5\' 4"  (1.626 m)     Physical exam: Exam: Gen: NAD, conversant, well nourised, well groomed                     CV: RRR, no MRG. No Carotid Bruits. No peripheral edema, warm, nontender Eyes: Conjunctivae clear without exudates or hemorrhage  Neuro: Detailed Neurologic Exam  Speech:    Speech is normal; fluent and spontaneous with normal comprehension.  Cognition:    MMSE - Mini Mental State Exam 06/11/2019  Orientation to time 5  Orientation to Place 5  Registration 3  Attention/ Calculation 3  Recall 2  Language- name 2 objects 2  Language- repeat 1  Language- follow 3 step command 3  Language- read & follow direction 1  Write a sentence 0  Copy design 0  Total score 25    Cranial Nerves:    The pupils are equal, round, and pinpoint, could not visualize fundi due to small pupils. Visual fields are full to finger confrontation. Extraocular movements are intact. Trigeminal sensation is intact and the muscles of mastication are normal. The face is symmetric. The palate elevates in the midline. Hearing intact. Voice is normal. Shoulder shrug is normal. The tongue has normal motion without  fasciculations.   Coordination:    Normal finger to nose and heel to shin.   Gait:    Heel-toe and tandem gait with minimal imbalance, good stride and arm swing, not shuffling  Motor Observation:    No asymmetry, no atrophy, and no involuntary movements noted. Tone:    Increased muscle tone vs paratonia  Posture:    Posture is normal. normal erect    Strength: Mild prox weakness may be due to poor effort but symmetrical, weak grip, otherwise strength is V/V in the upper and lower limbs.      Sensation: intact to LT     Reflex Exam:  DTR's:    Deep tendon reflexes in the upper and lower extremities are brisk bilaterally, right >> left Toes:  right upgoing, left downgoing    Clonus:    2 beats clonus    Assessment/Plan:  64 y.o. female here as requested by Horald Pollen, * for "Brain Fog"  PMHx thrombocytopenia, tachycardia, osteoporosis, hypertension, dementia in her mother, depression, anxiety, anemia. Per patient: Patient reports symptoms of "brain fog" since March when she had Covid however her husband today says it goes back longer than that, approximately 3.5 years, changes in personality, temperament, paranoia with delusions.  Mother had dementia of unknown type.  With her symptoms in early onset I wonder if this is a presentation of frontotemporal dementia however cannot rule out vascular dementia or other types of dementia.  Patient needs a considerable work-up.  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 Rayann Heman for evaluation if she doesn't already have a cardiologist. Also moderste to extensive white matter changes she may have vascular dementia she is due for formal memory testing.  Formal memory  testing for dementia MRI of the brain and cervical spine for dementia and falls Blood work  today If needed an FDG PET Scan can be considered Emg/ncs of the bilateral uppers for tingling and weakness in the hands  Follow up after formal memory testing Xanax for MRI    Orders Placed This Encounter  Procedures  . MR BRAIN W WO CONTRAST  . MR CERVICAL SPINE WO CONTRAST  . B12 and Folate Panel  . Methylmalonic acid, serum  . Homocysteine  . Vitamin B1  . RPR  . Sedimentation rate  . Ammonia  . Ambulatory referral to Neuropsychology   No orders of the defined types were placed in this encounter.   Cc: Horald Pollen, *,    Sarina Ill, MD  [facility] [address] [place], [place] [address]  Phone [phone] Fax [phone]

## 2019-06-11 NOTE — Progress Notes (Signed)
Jacqueline Bonilla 64 y.o.   Chief Complaint  Patient presents with  . Hypertension    per patient follow up     HISTORY OF PRESENT ILLNESS: This is a 64 y.o. female with history of hypertension here for follow-up. Saw [name], neurologist, this morning.  Scheduled for work-up of her brain fog.  Office note reviewed. Has had several falls during the past few weeks.  Denies syncope.  States she loses her footing/balance.  No serious injuries.  Advised to use a cane.  No other complaints or medical concerns. BP Readings from Last 3 Encounters:  06/11/19 120/76  06/11/19 (!) 161/94  04/22/19 (!) 148/86    HPI   Prior to Admission medications   Medication Sig Start Date End Date Taking? Authorizing Provider  Cholecalciferol (VITAMIN D3 GUMMIES PO) Take 1,000 mcg by mouth daily.   Yes [provider]  Cyanocobalamin (VITAMIN B 12 PO) Take 1,000 mcg by mouth daily.   Yes [provider]  MAGNESIUM PO Take by mouth as needed.    Yes [provider]  MELATONIN PO Take 10 mg by mouth daily.   Yes [provider]  Multiple Vitamins-Minerals (AIRBORNE GUMMIES) CHEW Chew by mouth daily.   Yes [provider]  Multiple Vitamins-Minerals (HAIR SKIN AND NAILS FORMULA PO) Take 2,500 mcg by mouth daily.   Yes [provider]  Probiotic Product (PROBIOTIC DAILY PO) Take by mouth daily.   Yes [provider]  UNABLE TO FIND Med Name: "striction D" (?) for blood pressure Patient not taking: Reported on 06/11/2019    [provider]    Allergies  Allergen Reactions  . Avelox [Moxifloxacin]   . Ciprofloxacin Hives  . Floraquin [Iodoquinol]     No cipro or others  . Iohexol      Code: HIVES, Desc: HIVES S/P IVP MANY YRS AGO...NO PROBLEM W/O PREP TODAY/A.[place], Onset Date: [id number]   . Levaquin [Levofloxacin]   . Quinolones     Patient Active Problem List   Diagnosis Date Noted  . Brain fog 04/22/2019  . History of  COVID-19 04/22/2019  . Essential hypertension 03/03/2019  . Hypertensive urgency 03/03/2019    Past Medical History:  Diagnosis Date  . Anemia   . Anxiety   . Depression   . Dysmenorrhea   . Endometriosis   . Fibroid   . Hypertension   . Microhematuria    negative workup  . Osteoporosis   . Tachycardia   . Thrombocytopenia ([place])     Past Surgical History:  Procedure Laterality Date  . BREAST BIOPSY    . CESAREAN SECTION    . hysteroscopic resection      Social History   Socioeconomic History  . Marital status: Married    Spouse name: Not on file  . Number of children: 1  . Years of education: Not on file  . Highest education level: Not on file  Occupational History  . Occupation: accounting  Tobacco Use  . Smoking status: Never Smoker  . Smokeless tobacco: Never Used  Vaping Use  . Vaping Use: Never used  Substance and Sexual Activity  . Alcohol use: Never  . Drug use: Never  . Sexual activity: Not Currently    Partners: Male    Comment: husband vasectomy  Other Topics Concern  . Not on file  Social History Narrative   Lives with husband   Grandchildren - 1   Works - Press photographer  Set belt 100%   Gun in home - yes - secured      Right handed   Caffeine: maybe tea every now and then   Social Determinants of Health   Financial Resource Strain:   . Difficulty of Paying Living Expenses:   Food Insecurity:   . Worried About Charity fundraiser in the Last Year:   . Arboriculturist in the Last Year:   Transportation Needs:   . Film/video editor (Medical):   Marland Kitchen Lack of Transportation (Non-Medical):   Physical Activity:   . Days of Exercise per Week:   . Minutes of Exercise per Session:   Stress:   . Feeling of Stress :   Social Connections:   . Frequency of Communication with Friends and Family:   . Frequency of Social Gatherings with Friends and Family:   . Attends Religious Services:   . Active Member of Clubs or Organizations:   .  Attends Archivist Meetings:   Marland Kitchen Marital Status:   Intimate Partner Violence:   . Fear of Current or Ex-Partner:   . Emotionally Abused:   Marland Kitchen Physically Abused:   . Sexually Abused:     Family History  Problem Relation Age of Onset  . Cancer Father        non hodgkin lymphoma & skin  . Heart attack Maternal Grandfather   . Dementia Mother      Review of Systems  Constitutional: Negative.  Negative for chills and fever.  HENT: Negative.  Negative for congestion and sore throat.   Eyes: Negative.   Respiratory: Negative.  Negative for cough and shortness of breath.   Cardiovascular: Negative.  Negative for chest pain and palpitations.  Gastrointestinal: Negative.  Negative for abdominal pain, diarrhea, nausea and vomiting.  Genitourinary: Negative.  Negative for hematuria.  Musculoskeletal: Negative for myalgias and neck pain.  Skin: Negative.   Neurological: Negative for dizziness and headaches.  All other systems reviewed and are negative.   Today's Vitals   06/11/19 1324  BP: 120/76  Pulse: 94  Resp: 16  Temp: 98.4 F (36.9 C)  TempSrc: Temporal  SpO2: 98%  Weight: 113 lb (51.3 kg)  Height: 5\' 4"  (1.626 m)   Body mass index is 19.4 kg/m.  Physical Exam Vitals reviewed.  Constitutional:      Appearance: Normal appearance.  HENT:     Head: Normocephalic.  Eyes:     Extraocular Movements: Extraocular movements intact.     Conjunctiva/sclera: Conjunctivae normal.     Pupils: Pupils are equal, round, and reactive to light.  Cardiovascular:     Rate and Rhythm: Normal rate and regular rhythm.     Pulses: Normal pulses.     Heart sounds: Normal heart sounds.  Pulmonary:     Effort: Pulmonary effort is normal.     Breath sounds: Normal breath sounds.  Musculoskeletal:        General: Normal range of motion.     Cervical back: Normal range of motion and neck supple.  Skin:    General: Skin is warm and dry.  Neurological:     General: No focal  deficit present.     Mental Status: She is alert and oriented to person, place, and time.  Psychiatric:        Mood and Affect: Mood normal.        Behavior: Behavior normal.    A total of 30 minutes was spent with the patient,  greater than 50% of which was in counseling/coordination of care regarding hypertension and cardiovascular risk associated with this condition, review of office notes from neurology visit today, review of most recent blood work results, review of all medications, review of most recent office visit notes, prognosis and need for follow-up.   ASSESSMENT & PLAN: Essential hypertension Well-controlled without medication.  Blood pressure readings at home mostly normal.  Patient does not want to take blood pressure medication.  Jacqueline Bonilla was seen today for hypertension.  Diagnoses and all orders for this visit:  Essential hypertension -     CBC with Differential/Platelet -     Hemoglobin A1c -     Comprehensive metabolic panel -     Lipid panel  History of COVID-19  Brain fog    Patient Instructions       If you have lab work done today you will be contacted with your lab results within the next 2 weeks.  If you have not heard from [name] then please contact us. The fastest way to get your results is to register for My Chart.   IF you received an x-ray today, you will receive an invoice from [facility]. Please contact [facility] at [phone] with questions or concerns regarding your invoice.   IF you received labwork today, you will receive an invoice from [place]. Please contact [facility] at [phone] with questions or concerns regarding your invoice.   Our billing staff will not be able to assist you with questions regarding bills from these companies.  You will be contacted with the lab results as soon as they are available. The fastest way to get your results is to activate your My Chart account. Instructions are located on the  last page of this paperwork. If you have not heard from [name] regarding the results in 2 weeks, please contact this office.     Hypertension, Adult High blood pressure (hypertension) is when the force of blood pumping through the arteries is too strong. The arteries are the blood vessels that carry blood from the heart throughout the body. Hypertension forces the heart to work harder to pump blood and may cause arteries to become narrow or stiff. Untreated or uncontrolled hypertension can cause a heart attack, heart failure, a stroke, kidney disease, and other problems. A blood pressure reading consists of a higher number over a lower number. Ideally, your blood pressure should be below 120/80. The first ("top") number is called the systolic pressure. It is a measure of the pressure in your arteries as your heart beats. The second ("bottom") number is called the diastolic pressure. It is a measure of the pressure in your arteries as the heart relaxes. What are the causes? The exact cause of this condition is not known. There are some conditions that result in or are related to high blood pressure. What increases the risk? Some risk factors for high blood pressure are under your control. The following factors may make you more likely to develop this condition:  Smoking.  Having type 2 diabetes mellitus, high cholesterol, or both.  Not getting enough exercise or physical activity.  Being overweight.  Having too much fat, sugar, calories, or salt (sodium) in your diet.  Drinking too much alcohol. Some risk factors for high blood pressure may be difficult or impossible to change. Some of these factors include:  Having chronic kidney disease.  Having a family history of high blood pressure.  Age. Risk increases with age.  Race. You may  be at higher risk if you are African American.  Gender. Men are at higher risk than women before age 3. After age 38, women are at higher risk than  men.  Having obstructive sleep apnea.  Stress. What are the signs or symptoms? High blood pressure may not cause symptoms. Very high blood pressure (hypertensive crisis) may cause:  Headache.  Anxiety.  Shortness of breath.  Nosebleed.  Nausea and vomiting.  Vision changes.  Severe chest pain.  Seizures. How is this diagnosed? This condition is diagnosed by measuring your blood pressure while you are seated, with your arm resting on a flat surface, your legs uncrossed, and your feet flat on the floor. The cuff of the blood pressure monitor will be placed directly against the skin of your upper arm at the level of your heart. It should be measured at least twice using the same arm. Certain conditions can cause a difference in blood pressure between your right and left arms. Certain factors can cause blood pressure readings to be lower or higher than normal for a short period of time:  When your blood pressure is higher when you are in a health care provider's office than when you are at home, this is called white coat hypertension. Most people with this condition do not need medicines.  When your blood pressure is higher at home than when you are in a health care provider's office, this is called masked hypertension. Most people with this condition may need medicines to control blood pressure. If you have a high blood pressure reading during one visit or you have normal blood pressure with other risk factors, you may be asked to:  Return on a different day to have your blood pressure checked again.  Monitor your blood pressure at home for 1 week or longer. If you are diagnosed with hypertension, you may have other blood or imaging tests to help your health care provider understand your overall risk for other conditions. How is this treated? This condition is treated by making healthy lifestyle changes, such as eating healthy foods, exercising more, and reducing your alcohol  intake. Your health care provider may prescribe medicine if lifestyle changes are not enough to get your blood pressure under control, and if:  Your systolic blood pressure is above 130.  Your diastolic blood pressure is above 80. Your personal target blood pressure may vary depending on your medical conditions, your age, and other factors. Follow these instructions at home: Eating and drinking   Eat a diet that is high in fiber and potassium, and low in sodium, added sugar, and fat. An example eating plan is called the DASH (Dietary Approaches to Stop Hypertension) diet. To eat this way: ? Eat plenty of fresh fruits and vegetables. Try to fill one half of your plate at each meal with fruits and vegetables. ? Eat whole grains, such as whole-wheat pasta, brown rice, or whole-grain bread. Fill about one fourth of your plate with whole grains. ? Eat or drink low-fat dairy products, such as skim milk or low-fat yogurt. ? Avoid fatty cuts of meat, processed or cured meats, and poultry with skin. Fill about one fourth of your plate with lean proteins, such as fish, chicken without skin, beans, eggs, or tofu. ? Avoid pre-made and processed foods. These tend to be higher in sodium, added sugar, and fat.  Reduce your daily sodium intake. Most people with hypertension should eat less than 1,500 mg of sodium a day.  Do not  drink alcohol if: ? Your health care provider tells you not to drink. ? You are pregnant, may be pregnant, or are planning to become pregnant.  If you drink alcohol: ? Limit how much you use to:  0-1 drink a day for women.  0-2 drinks a day for men. ? Be aware of how much alcohol is in your drink. In the [place] one drink equals one 12 oz bottle of beer (355 mL), one 5 oz glass of wine (148 mL), or one 1 oz glass of hard liquor (44 mL). Lifestyle   Work with your health care provider to maintain a healthy body weight or to lose weight. Ask what an ideal weight is for  you.  Get at least 30 minutes of exercise most days of the week. Activities may include walking, swimming, or biking.  Include exercise to strengthen your muscles (resistance exercise), such as Pilates or lifting weights, as part of your weekly exercise routine. Try to do these types of exercises for 30 minutes at least 3 days a week.  Do not use any products that contain nicotine or tobacco, such as cigarettes, e-cigarettes, and chewing tobacco. If you need help quitting, ask your health care provider.  Monitor your blood pressure at home as told by your health care provider.  Keep all follow-up visits as told by your health care provider. This is important. Medicines  Take over-the-counter and prescription medicines only as told by your health care provider. Follow directions carefully. Blood pressure medicines must be taken as prescribed.  Do not skip doses of blood pressure medicine. Doing this puts you at risk for problems and can make the medicine less effective.  Ask your health care provider about side effects or reactions to medicines that you should watch for. Contact a health care provider if you:  Think you are having a reaction to a medicine you are taking.  Have headaches that keep coming back (recurring).  Feel dizzy.  Have swelling in your ankles.  Have trouble with your vision. Get help right away if you:  Develop a severe headache or confusion.  Have unusual weakness or numbness.  Feel faint.  Have severe pain in your chest or abdomen.  Vomit repeatedly.  Have trouble breathing. Summary  Hypertension is when the force of blood pumping through your arteries is too strong. If this condition is not controlled, it may put you at risk for serious complications.  Your personal target blood pressure may vary depending on your medical conditions, your age, and other factors. For most people, a normal blood pressure is less than 120/80.  Hypertension is  treated with lifestyle changes, medicines, or a combination of both. Lifestyle changes include losing weight, eating a healthy, low-sodium diet, exercising more, and limiting alcohol. This information is not intended to replace advice given to you by your health care provider. Make sure you discuss any questions you have with your health care provider. Document Revised: 08/28/2017 Document Reviewed: 08/28/2017 Elsevier Patient Education  2020 [employer]      Agustina Caroli, MD [facility]

## 2019-06-11 NOTE — Assessment & Plan Note (Signed)
Well-controlled without medication.  Blood pressure readings at home mostly normal.  Patient does not want to take blood pressure medication.

## 2019-06-11 NOTE — Patient Instructions (Addendum)
   If you have lab work done today you will be contacted with your lab results within the next 2 weeks.  If you have not heard from us then please contact us. The fastest way to get your results is to register for My Chart.   IF you received an x-ray today, you will receive an invoice from [facility]. Please contact [facility] at [phone] with questions or concerns regarding your invoice.   IF you received labwork today, you will receive an invoice from [facility]. Please contact [facility] at [phone] with questions or concerns regarding your invoice.   Our billing staff will not be able to assist you with questions regarding bills from these companies.  You will be contacted with the lab results as soon as they are available. The fastest way to get your results is to activate your My Chart account. Instructions are located on the last page of this paperwork. If you have not heard from us regarding the results in 2 weeks, please contact this office.      Hypertension, Adult High blood pressure (hypertension) is when the force of blood pumping through the arteries is too strong. The arteries are the blood vessels that carry blood from the heart throughout the body. Hypertension forces the heart to work harder to pump blood and may cause arteries to become narrow or stiff. Untreated or uncontrolled hypertension can cause a heart attack, heart failure, a stroke, kidney disease, and other problems. A blood pressure reading consists of a higher number over a lower number. Ideally, your blood pressure should be below 120/80. The first ("top") number is called the systolic pressure. It is a measure of the pressure in your arteries as your heart beats. The second ("bottom") number is called the diastolic pressure. It is a measure of the pressure in your arteries as the heart relaxes. What are the causes? The exact cause of this condition is not known. There are some conditions  that result in or are related to high blood pressure. What increases the risk? Some risk factors for high blood pressure are under your control. The following factors may make you more likely to develop this condition:  Smoking.  Having type 2 diabetes mellitus, high cholesterol, or both.  Not getting enough exercise or physical activity.  Being overweight.  Having too much fat, sugar, calories, or salt (sodium) in your diet.  Drinking too much alcohol. Some risk factors for high blood pressure may be difficult or impossible to change. Some of these factors include:  Having chronic kidney disease.  Having a family history of high blood pressure.  Age. Risk increases with age.  Race. You may be at higher risk if you are African American.  Gender. Men are at higher risk than women before age 45. After age 65, women are at higher risk than men.  Having obstructive sleep apnea.  Stress. What are the signs or symptoms? High blood pressure may not cause symptoms. Very high blood pressure (hypertensive crisis) may cause:  Headache.  Anxiety.  Shortness of breath.  Nosebleed.  Nausea and vomiting.  Vision changes.  Severe chest pain.  Seizures. How is this diagnosed? This condition is diagnosed by measuring your blood pressure while you are seated, with your arm resting on a flat surface, your legs uncrossed, and your feet flat on the floor. The cuff of the blood pressure monitor will be placed directly against the skin of your upper arm at the level of your   heart. It should be measured at least twice using the same arm. Certain conditions can cause a difference in blood pressure between your right and left arms. Certain factors can cause blood pressure readings to be lower or higher than normal for a short period of time:  When your blood pressure is higher when you are in a health care provider's office than when you are at home, this is called white coat hypertension.  Most people with this condition do not need medicines.  When your blood pressure is higher at home than when you are in a health care provider's office, this is called masked hypertension. Most people with this condition may need medicines to control blood pressure. If you have a high blood pressure reading during one visit or you have normal blood pressure with other risk factors, you may be asked to:  Return on a different day to have your blood pressure checked again.  Monitor your blood pressure at home for 1 week or longer. If you are diagnosed with hypertension, you may have other blood or imaging tests to help your health care provider understand your overall risk for other conditions. How is this treated? This condition is treated by making healthy lifestyle changes, such as eating healthy foods, exercising more, and reducing your alcohol intake. Your health care provider may prescribe medicine if lifestyle changes are not enough to get your blood pressure under control, and if:  Your systolic blood pressure is above 130.  Your diastolic blood pressure is above 80. Your personal target blood pressure may vary depending on your medical conditions, your age, and other factors. Follow these instructions at home: Eating and drinking   Eat a diet that is high in fiber and potassium, and low in sodium, added sugar, and fat. An example eating plan is called the DASH (Dietary Approaches to Stop Hypertension) diet. To eat this way: ? Eat plenty of fresh fruits and vegetables. Try to fill one half of your plate at each meal with fruits and vegetables. ? Eat whole grains, such as whole-wheat pasta, brown rice, or whole-grain bread. Fill about one fourth of your plate with whole grains. ? Eat or drink low-fat dairy products, such as skim milk or low-fat yogurt. ? Avoid fatty cuts of meat, processed or cured meats, and poultry with skin. Fill about one fourth of your plate with lean proteins, such  as fish, chicken without skin, beans, eggs, or tofu. ? Avoid pre-made and processed foods. These tend to be higher in sodium, added sugar, and fat.  Reduce your daily sodium intake. Most people with hypertension should eat less than 1,500 mg of sodium a day.  Do not drink alcohol if: ? Your health care provider tells you not to drink. ? You are pregnant, may be pregnant, or are planning to become pregnant.  If you drink alcohol: ? Limit how much you use to:  0-1 drink a day for women.  0-2 drinks a day for men. ? Be aware of how much alcohol is in your drink. In the [place] one drink equals one 12 oz bottle of beer (355 mL), one 5 oz glass of wine (148 mL), or one 1 oz glass of hard liquor (44 mL). Lifestyle   Work with your health care provider to maintain a healthy body weight or to lose weight. Ask what an ideal weight is for you.  Get at least 30 minutes of exercise most days of the week. Activities may include walking, swimming,   or biking.  Include exercise to strengthen your muscles (resistance exercise), such as Pilates or lifting weights, as part of your weekly exercise routine. Try to do these types of exercises for 30 minutes at least 3 days a week.  Do not use any products that contain nicotine or tobacco, such as cigarettes, e-cigarettes, and chewing tobacco. If you need help quitting, ask your health care provider.  Monitor your blood pressure at home as told by your health care provider.  Keep all follow-up visits as told by your health care provider. This is important. Medicines  Take over-the-counter and prescription medicines only as told by your health care provider. Follow directions carefully. Blood pressure medicines must be taken as prescribed.  Do not skip doses of blood pressure medicine. Doing this puts you at risk for problems and can make the medicine less effective.  Ask your health care provider about side effects or reactions to medicines that you  should watch for. Contact a health care provider if you:  Think you are having a reaction to a medicine you are taking.  Have headaches that keep coming back (recurring).  Feel dizzy.  Have swelling in your ankles.  Have trouble with your vision. Get help right away if you:  Develop a severe headache or confusion.  Have unusual weakness or numbness.  Feel faint.  Have severe pain in your chest or abdomen.  Vomit repeatedly.  Have trouble breathing. Summary  Hypertension is when the force of blood pumping through your arteries is too strong. If this condition is not controlled, it may put you at risk for serious complications.  Your personal target blood pressure may vary depending on your medical conditions, your age, and other factors. For most people, a normal blood pressure is less than 120/80.  Hypertension is treated with lifestyle changes, medicines, or a combination of both. Lifestyle changes include losing weight, eating a healthy, low-sodium diet, exercising more, and limiting alcohol. This information is not intended to replace advice given to you by your health care provider. Make sure you discuss any questions you have with your health care provider. Document Revised: 08/28/2017 Document Reviewed: 08/28/2017 Elsevier Patient Education  2020 Elsevier Inc.  

## 2019-06-11 NOTE — Patient Instructions (Addendum)
Formal memory testing MRI of the brain and cervical spine Blood work If needed an FDG PET Scan emg/ncs  Follow up after formal memory testing   Electromyoneurogram Electromyoneurogram is a test to check how well your muscles and nerves are working. This procedure includes the combined use of electromyogram (EMG) and nerve conduction study (NCS). EMG is used to look for muscular disorders. NCS, which is also called electroneurogram, measures how well your nerves are controlling your muscles. The procedures are usually done together to check if your muscles and nerves are healthy. If the results of the tests are abnormal, this may indicate disease or injury, such as a neuromuscular disease or peripheral nerve damage. Tell a health care provider about:  Any allergies you have.  All medicines you are taking, including vitamins, herbs, eye drops, creams, and over-the-counter medicines.  Any problems you or family members have had with anesthetic medicines.  Any blood disorders you have.  Any surgeries you have had.  Any medical conditions you have.  If you have a pacemaker.  Whether you are pregnant or may be pregnant. What are the risks? Generally, this is a safe procedure. However, problems may occur, including:  Infection where the electrodes were inserted.  Bleeding. What happens before the procedure? Medicines Ask your health care provider about:  Changing or stopping your regular medicines. This is especially important if you are taking diabetes medicines or blood thinners.  Taking medicines such as aspirin and ibuprofen. These medicines can thin your blood. Do not take these medicines unless your health care provider tells you to take them.  Taking over-the-counter medicines, vitamins, herbs, and supplements. General instructions  Your health care provider may ask you to avoid: ? Beverages that have caffeine, such as coffee and tea. ? Any products that contain  nicotine or tobacco. These products include cigarettes, e-cigarettes, and chewing tobacco. If you need help quitting, ask your health care provider.  Do not use lotions or creams on the same day that you will be having the procedure. What happens during the procedure? For EMG   Your health care provider will ask you to stay in a position so that he or she can access the muscle that will be studied. You may be standing, sitting, or lying down.  You may be given a medicine that numbs the area (local anesthetic).  A very thin needle that has an electrode will be inserted into your muscle.  Another small electrode will be placed on your skin near the muscle.  Your health care provider will ask you to continue to remain still.  The electrodes will send a signal that tells about the electrical activity of your muscles. You may see this on a monitor or hear it in the room.  After your muscles have been studied at rest, your health care provider will ask you to contract or flex your muscles. The electrodes will send a signal that tells about the electrical activity of your muscles.  Your health care provider will remove the electrodes and the electrode needles when the procedure is finished. The procedure may vary among health care providers and hospitals. For NCS   An electrode that records your nerve activity (recording electrode) will be placed on your skin by the muscle that is being studied.  An electrode that is used as a reference (reference electrode) will be placed near the recording electrode.  A paste or gel will be applied to your skin between the recording electrode and the  reference electrode.  Your nerve will be stimulated with a mild shock. Your health care provider will measure how much time it takes for your muscle to react.  Your health care provider will remove the electrodes and the gel when the procedure is finished. The procedure may vary among health care providers  and hospitals. What happens after the procedure?  It is up to you to get the results of your procedure. Ask your health care provider, or the department that is doing the procedure, when your results will be ready.  Your health care provider may: ? Give you medicines for any pain. ? Monitor the insertion sites to make sure that bleeding stops. Summary  Electromyoneurogram is a test to check how well your muscles and nerves are working.  If the results of the tests are abnormal, this may indicate disease or injury.  This is a safe procedure. However, problems may occur, such as bleeding and infection.  Your health care provider will do two tests to complete this procedure. One checks your muscles (EMG) and another checks your nerves (NCS).  It is up to you to get the results of your procedure. Ask your health care provider, or the department that is doing the procedure, when your results will be ready. This information is not intended to replace advice given to you by your health care provider. Make sure you discuss any questions you have with your health care provider. Document Revised: 09/03/2017 Document Reviewed: 08/16/2017 Elsevier Patient Education  [place].

## 2019-06-12 LAB — COMPREHENSIVE METABOLIC PANEL
ALT: 15 IU/L (ref 0–32)
AST: 19 IU/L (ref 0–40)
Albumin/Globulin Ratio: 1.6 (ref 1.2–2.2)
Albumin: 4.6 g/dL (ref 3.8–4.8)
Alkaline Phosphatase: 107 IU/L (ref 48–121)
BUN/Creatinine Ratio: 27 (ref 12–28)
BUN: 27 mg/dL (ref 8–27)
Bilirubin Total: 0.2 mg/dL (ref 0.0–1.2)
CO2: 26 mmol/L (ref 20–29)
Calcium: 9.4 mg/dL (ref 8.7–10.3)
Chloride: 104 mmol/L (ref 96–106)
Creatinine, Ser: 1 mg/dL (ref 0.57–1.00)
GFR calc Af Amer: 68 mL/min/{1.73_m2} (ref >59)
GFR calc non Af Amer: 59 mL/min/{1.73_m2} — ABNORMAL LOW (ref >59)
Globulin, Total: 2.8 g/dL (ref 1.5–4.5)
Glucose: 93 mg/dL (ref 65–99)
Potassium: 4.7 mmol/L (ref 3.5–5.2)
Sodium: 143 mmol/L (ref 134–144)
Total Protein: 7.4 g/dL (ref 6.0–8.5)

## 2019-06-12 LAB — CBC WITH DIFFERENTIAL/PLATELET
Basophils Absolute: 0.1 10*3/uL (ref 0.0–0.2)
Basos: 1 %
EOS (ABSOLUTE): 0.1 10*3/uL (ref 0.0–0.4)
Eos: 1 %
Hematocrit: 40.2 % (ref 34.0–46.6)
Hemoglobin: 13.3 g/dL (ref 11.1–15.9)
Immature Grans (Abs): 0 10*3/uL (ref 0.0–0.1)
Immature Granulocytes: 0 %
Lymphocytes Absolute: 1.3 10*3/uL (ref 0.7–3.1)
Lymphs: 21 %
MCH: 30.2 pg (ref 26.6–33.0)
MCHC: 33.1 g/dL (ref 31.5–35.7)
MCV: 91 fL (ref 79–97)
Monocytes Absolute: 0.6 10*3/uL (ref 0.1–0.9)
Monocytes: 9 %
Neutrophils Absolute: 4.1 10*3/uL (ref 1.4–7.0)
Neutrophils: 68 %
Platelets: 160 10*3/uL (ref 150–450)
RBC: 4.4 x10E6/uL (ref 3.77–5.28)
RDW: 12 % (ref 11.7–15.4)
WBC: 6.2 10*3/uL (ref 3.4–10.8)

## 2019-06-12 LAB — LIPID PANEL
Chol/HDL Ratio: 2.8 ratio (ref 0.0–4.4)
Cholesterol, Total: 173 mg/dL (ref 100–199)
HDL: 62 mg/dL (ref >39)
LDL Chol Calc (NIH): 93 mg/dL (ref 0–99)
Triglycerides: 101 mg/dL (ref 0–149)
VLDL Cholesterol Cal: 18 mg/dL (ref 5–40)

## 2019-06-12 LAB — HEMOGLOBIN A1C
Est. average glucose Bld gHb Est-mCnc: 114 mg/dL
Hgb A1c MFr Bld: 5.6 % (ref 4.8–5.6)

## 2019-06-15 ENCOUNTER — Encounter: Payer: Self-pay | Admitting: Neurology

## 2019-06-16 ENCOUNTER — Telehealth: Payer: Self-pay | Admitting: *Deleted

## 2019-06-16 LAB — METHYLMALONIC ACID, SERUM: Methylmalonic Acid: 239 nmol/L (ref 0–378)

## 2019-06-16 LAB — SEDIMENTATION RATE: Sed Rate: 5 mm/hr (ref 0–40)

## 2019-06-16 LAB — VITAMIN B1: Thiamine: 139.6 nmol/L (ref 66.5–200.0)

## 2019-06-16 LAB — B12 AND FOLATE PANEL
Folate: >20 ng/mL (ref >3.0)
Vitamin B-12: 936 pg/mL (ref 232–1245)

## 2019-06-16 LAB — RPR: RPR Ser Ql: NONREACTIVE

## 2019-06-16 LAB — HOMOCYSTEINE: Homocysteine: 7.4 umol/L (ref 0.0–17.2)

## 2019-06-16 LAB — AMMONIA: Ammonia: 28 ug/dL — ABNORMAL LOW (ref 34–178)

## 2019-06-16 NOTE — Telephone Encounter (Signed)
Pt's husband Eduard Clos (on [name]) called with follow-up questions to office visit and about FTD. His questions were all answered and he was very appreciative. We discussed the current plan of EMG/NCV, MRIs, formal memory testing with Dr. Sima Matas and ?FDG pet scan if needed in future. He is aware there are concerns for FTD but this has NOT been diagnosed yet and we are doing all of the testing to rule out or confirm the diagnosis.

## 2019-06-18 ENCOUNTER — Telehealth: Payer: Self-pay | Admitting: *Deleted

## 2019-06-18 ENCOUNTER — Encounter: Payer: Self-pay | Admitting: *Deleted

## 2019-06-18 ENCOUNTER — Telehealth: Payer: Self-pay | Admitting: Neurology

## 2019-06-18 NOTE — Telephone Encounter (Addendum)
Per [name], husband paged this morning d/t patient having 3 falls, loss of muscles, eating w/ fingers. I tried to call him back to discuss but received a message saying VM has not been setup yet.   I tried to reach husband again and was able to speak to him. He updated the pt's family history and stated pt's sister had Polymyositis and that she died of it. He said the sister went from being ambulatory and having some breathing problems to being on ECMO in hospital with PNA then passed within 7 weeks. He also updated that the pt's mother had an unknown dementia. She was tormented after her daughter passed. She also lost of use of hips, lower back, legs, and had trouble holding things and says this is the same as the patient. He reported the patient has had loss of musculature (facial, neck, back) and looks like her mother did when she was 88. Pt has fallen 3 times in last 10 days, last fall there was no obstacle, no tripping. The other day with a fall she did fall into the back of the couch and hit her head but he said it was soft, no concerns of head injury. She is now eating with her fingers, has trouble holding onto things well, holds her phone like it's a large device. Husband is concerned of her decline. She complains of no pain or soreness just fatigue. He was advised if she is rapidly declining the recommendation is to go to the ER. He was also advised the MRI was sent to [facility] and he can call today to schedule.

## 2019-06-18 NOTE — Telephone Encounter (Addendum)
Spoke with [name]. She advises patient be checked for a UTI by PCP or ER.

## 2019-06-18 NOTE — Telephone Encounter (Signed)
[employer] Auth: [id number] (exp. 06/18/19 to 12/14/19) order sent to [facility]. They will reach out to the patient to schedule.

## 2019-06-18 NOTE — Telephone Encounter (Signed)
I spoke with the pt's husband again and advised him of [name] recommendation. He will call PCP office and discuss this. He is also going to call GI to try to schedule MRI. He is aware for continued decline she needs to go to the ER. He verbalized appreciation for the call.

## 2019-06-23 ENCOUNTER — Ambulatory Visit
Admission: RE | Admit: 2019-06-23 | Discharge: 2019-06-23 | Disposition: A | Discharge status: Home / Self Care | Payer: BC Managed Care – PPO | Source: Ambulatory Visit | Attending: Neurology | Admitting: Neurology

## 2019-06-23 DIAGNOSIS — W19XXXA Unspecified fall, initial encounter: Secondary | ICD-10-CM

## 2019-06-23 DIAGNOSIS — R200 Anesthesia of skin: Secondary | ICD-10-CM | POA: Diagnosis not present

## 2019-06-23 DIAGNOSIS — R413 Other amnesia: Secondary | ICD-10-CM

## 2019-06-23 DIAGNOSIS — R270 Ataxia, unspecified: Secondary | ICD-10-CM

## 2019-06-23 DIAGNOSIS — R4189 Other symptoms and signs involving cognitive functions and awareness: Secondary | ICD-10-CM

## 2019-06-23 DIAGNOSIS — R4689 Other symptoms and signs involving appearance and behavior: Secondary | ICD-10-CM

## 2019-06-23 DIAGNOSIS — G3109 Other frontotemporal dementia: Secondary | ICD-10-CM

## 2019-06-23 MED ORDER — GADOBENATE DIMEGLUMINE 529 MG/ML IV SOLN
10.0000 mL | Freq: Once | INTRAVENOUS | Status: AC | PRN
  Administered 2019-06-23: 10 mL via INTRAVENOUS

## 2019-06-25 ENCOUNTER — Telehealth: Payer: Self-pay | Admitting: Neurology

## 2019-06-25 DIAGNOSIS — I6349 Cerebral infarction due to embolism of other cerebral artery: Secondary | ICD-10-CM

## 2019-06-25 DIAGNOSIS — G3281 Cerebellar ataxia in diseases classified elsewhere: Secondary | ICD-10-CM

## 2019-06-25 DIAGNOSIS — I639 Cerebral infarction, unspecified: Secondary | ICD-10-CM | POA: Insufficient documentation

## 2019-06-25 MED ORDER — PREDNISONE 50 MG PO TABS
ORAL_TABLET | ORAL | 0 refills | Status: DC
Start: 2019-06-25 — End: 2019-08-20

## 2019-06-25 NOTE — Addendum Note (Signed)
Addended by: Gildardo Griffes on: 06/25/2019 12:38 PM   Modules accepted: Orders

## 2019-06-25 NOTE — Telephone Encounter (Signed)
[employer] Auth: [id number] (exp. 06/25/19 to 12/21/19) order sent to [facility]. They will reach out to the patient to schedule.

## 2019-06-25 NOTE — Telephone Encounter (Signed)
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 Pattern suggests a proximal / cardioembolic source.  -Moderate chronic small vessel ischemic disease.     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

## 2019-06-25 NOTE — Telephone Encounter (Signed)
Spoke with Dr. Krista Blue. I called the patient's husband [name] (on [name]) and explained per Dr. Krista Blue that the MRI c-spine is normal and unfortunately her MRI brain shows scattered strokes both old and new, exact time unknown but estimate newer ones are within the past week. We discussed ?embolic source so further testing is needed to check the heart and vessels. Echocardiogram ordered and he will receive a separate call to schedule. CT angio head and neck ordered to check the vessels and lining of vessels. Contrast dye needed. 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 I let him know we likely would proceed with Prednisone 50 mg per Dr. Krista Blue that should be taken at 13 hours, 7 hours, & 1 hour prior to CT along and Benadryl should be taken 1 hour prior to CT. I let him know per Raquel Sarna that the pt can walk in to GI between 8 AM and 4 PM however they would need to schedule a time for CT so the Prednisone can be taken and scheduled accordingly. He does not believe the patient is on aspirin. I advised per Dr. Krista Blue that she should take 325 mg daily. He said the pt had gone through a 2 week period of about 11 falls but only a few were "real falls". She had falls where she sat down thinking a chair was behind her but it wasn't. I advised if the patient has any acute changes and has unilateral weakness, vision loss, dizziness, cannot walk or talk or has trouble talking to call 911 to get to hospital. He verbalized understanding. His questions were answered during the call.   I spoke with Dr. Krista Blue and placed v.o. for Prednisone 50 mg #3 tablets, 0 refills.   Pt's husband aware prescription was sent in and he understands the instructions: 50 mg prednisone: take 1 pill 13 hours, 7 hours  and 1 hour before CT for a total of 3 doses of prednisone. Take 50mg  of Benadryl with the prednisone 1 hour before CT for a total  of one dose of benadryl. He also has the number for GI and will call to schedule a time tomorrow for the pt. Per Dr. Krista Blue, ok to do within next couple of days but not to wait 2 weeks. He will work on this now. He verbalized appreciation for the call.

## 2019-06-25 NOTE — Telephone Encounter (Signed)
Noted  

## 2019-06-26 ENCOUNTER — Telehealth: Payer: Self-pay | Admitting: Emergency Medicine

## 2019-06-26 NOTE — Telephone Encounter (Signed)
Paperwork is in providers box beside nurses station.

## 2019-06-26 NOTE — Telephone Encounter (Signed)
Pts husband came in and dropped off paperwork that needs to be filled out. Pts husband attached his phone number with a sticky note if providers has questions regarding paper work and to contact to pick up. Please advise.

## 2019-06-29 ENCOUNTER — Telehealth: Payer: Self-pay | Admitting: *Deleted

## 2019-06-29 ENCOUNTER — Telehealth: Payer: Self-pay | Admitting: Neurology

## 2019-06-29 NOTE — Telephone Encounter (Signed)
Please address. thanks

## 2019-06-29 NOTE — Telephone Encounter (Signed)
Patient is scheduled at [facility] for 06/30/19 for her CTA's

## 2019-06-29 NOTE — Telephone Encounter (Signed)
I tried giving the patient call back but his cell phone was not working. I gave their home number a call and the patient picked up. She wanted to know when was her neck appointment was I informed her 07/02/19 to arrive at 8:00 AM. I also asked her to let her husband to give me a call back if need be.

## 2019-06-29 NOTE — Telephone Encounter (Signed)
Patient's husband presented to the lobby requesting a call back regarding MRI sch at [facility]. Best call back [phone]

## 2019-06-29 NOTE — Telephone Encounter (Signed)
Patient husband called me back and asked me about the CTA's. I informed him that I Marisa Severin at GI to see if they can get her in for a sooner appointment and to only call him.

## 2019-06-29 NOTE — Telephone Encounter (Signed)
Done emailed out today.

## 2019-06-29 NOTE — Telephone Encounter (Addendum)
Spoke to patient's spouse Eduard Clos) to let him know the forms he dropped off for his spouse was faxed to [place] claim. Confirmation page [phone].Marland Kitchen

## 2019-06-29 NOTE — Telephone Encounter (Signed)
Patient husband called stated he would like to get copies of the results from her MRI's .

## 2019-06-30 ENCOUNTER — Ambulatory Visit
Admission: RE | Admit: 2019-06-30 | Discharge: 2019-06-30 | Disposition: A | Discharge status: Home / Self Care | Payer: BC Managed Care – PPO | Source: Ambulatory Visit | Attending: Neurology | Admitting: Neurology

## 2019-06-30 ENCOUNTER — Telehealth: Payer: Self-pay | Admitting: Neurology

## 2019-06-30 DIAGNOSIS — G3281 Cerebellar ataxia in diseases classified elsewhere: Secondary | ICD-10-CM

## 2019-06-30 DIAGNOSIS — I6523 Occlusion and stenosis of bilateral carotid arteries: Secondary | ICD-10-CM | POA: Diagnosis not present

## 2019-06-30 DIAGNOSIS — I6349 Cerebral infarction due to embolism of other cerebral artery: Secondary | ICD-10-CM

## 2019-06-30 IMAGING — CT CT ANGIO NECK
1 of 3 series · 3 of 16 positions shown · IV contrast (APPLIED)
Comparison: Brain MRI [DATE]

CLINICAL DATA: Cerebellar ataxia in diseases classified elsewhere.
Ataxia, stroke suspected. Additional history provided by
technologist: Aggravation, memory loss

EXAM:
CT ANGIOGRAPHY HEAD AND NECK
TECHNIQUE: Multidetector CT imaging of the head and neck was performed using
the standard protocol during bolus administration of intravenous
contrast. Multiplanar CT image reconstructions and MIPs were
obtained to evaluate the vascular anatomy. Carotid stenosis
measurements (when applicable) are obtained utilizing NASCET
criteria, using the distal internal carotid diameter as the
denominator.
CONTRAST:  75mL [VN] IOPAMIDOL ([VN]) INJECTION 76%

[Series 7: head/neck angio · axial · 0.44mm/px · z∈[-292,-112]mm · 3 of 182 slices shown]
[im 46/182  soft-tissue]
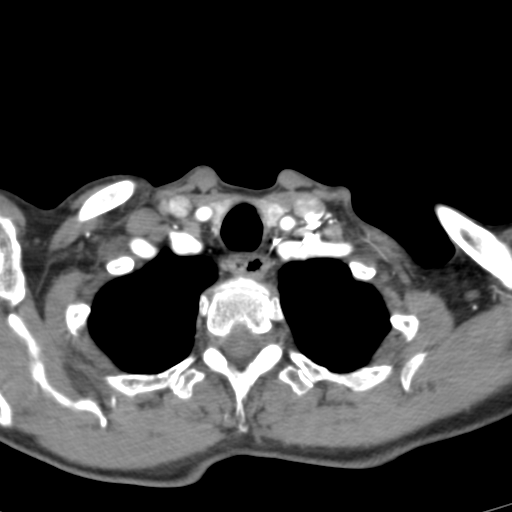
[im 91/182  bone]
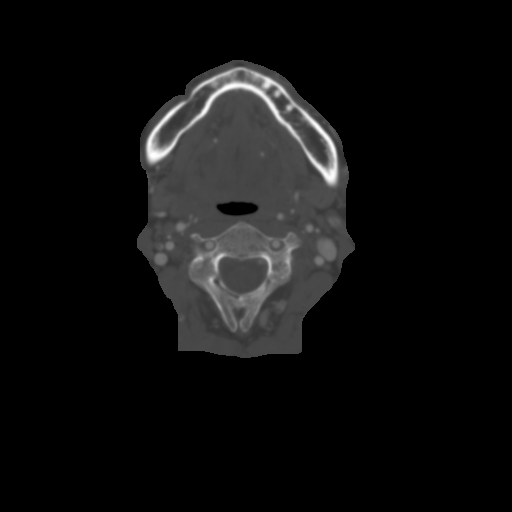
[im 136/182  soft-tissue]
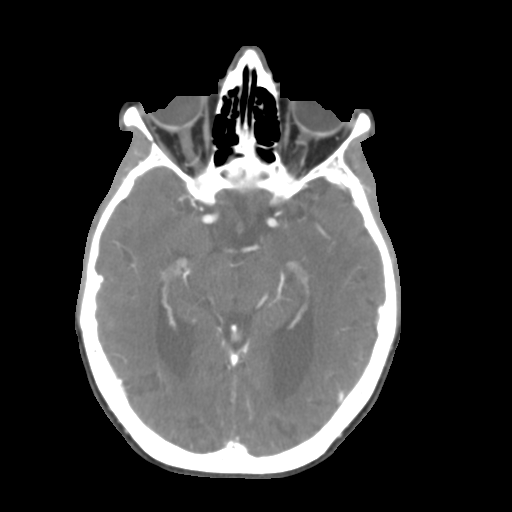

[3 of 16 positions shown; findings below may reference images not displayed]

FINDINGS: CT HEAD FINDINGS

Brain:

Small scattered subacute infarcts within the bilateral cortical and
subcortical frontoparietal lobes were better appreciated on prior
MRI [DATE].

Again demonstrate are multifocal small chronic cortically based
infarcts within the bilateral frontoparietal and occipital lobes.

Redemonstrated small chronic infarcts within the right cerebellum

Moderate patchy hypodensity within the cerebral white matter is
nonspecific, but consistent with chronic small vessel ischemic
disease.

Stable moderate generalized cerebral atrophy with without definite
lobar predominance. Stable mild cerebellar atrophy.

There is no acute intracranial hemorrhage.

No extra-axial fluid collection.

No evidence of intracranial mass.

No midline shift.

Vascular: Reported below.

Skull: Normal. Negative for fracture or focal lesion.

Sinuses: No significant paranasal sinus disease.

Orbits: No acute abnormality.

Review of the MIP images confirms the above findings

CTA NECK FINDINGS

Aortic arch: Standard aortic branching. No hemodynamically
significant innominate or proximal subclavian artery stenosis.

Right carotid system: CCA and ICA patent within the neck without
stenosis. No significant atherosclerotic disease.

Left carotid system: CCA and ICA patent within the neck without
stenosis. No significant atherosclerotic disease.

Vertebral arteries: Codominant and patent within the neck without
stenosis.

Skeleton: No acute bony abnormality or aggressive osseous lesion.

Other neck: Subcentimeter thyroid nodules not meeting consensus
criteria for ultrasound follow-up. No neck mass or pathologically
enlarged cervical chain lymph nodes.

Upper chest: No consolidation within the imaged lung apices.

Review of the MIP images confirms the above findings

CTA HEAD FINDINGS

Anterior circulation:

The intracranial internal carotid arteries are patent. Mixed plaque
within both vessels. No significant stenosis on the right. Mild
stenosis of the cavernous left ICA.

The M1 middle cerebral arteries are patent without significant
stenosis. No M2 proximal branch occlusion is identified. Moderate
focal stenosis within a superior division proximal M2 left MCA
branch vessel (series 15, image 31).

The anterior cerebral arteries are patent without significant
proximal stenosis.

No intracranial aneurysm is identified.

Posterior circulation:

The intracranial internal carotid arteries are patent without
significant stenosis, as is the basilar artery. The posterior
cerebral arteries are patent without significant proximal stenosis.
Posterior communicating arteries are hypoplastic or absent
bilaterally.

Venous sinuses: Within limitations of contrast timing, no convincing
thrombus.

Anatomic variants: As described

Review of the MIP images confirms the above findings
IMPRESSION: CT head:

1. Small subacute infarcts within the bilateral cortical and
subcortical frontoparietal lobes were better appreciated on prior
MRI [DATE].
[DATE]. Redemonstrated multifocal small chronic cortically based infarcts
within the bilateral frontoparietal and occipital lobes.
3. Known small chronic infarcts within the right cerebellum.
4. Stable moderate chronic small vessel ischemic changes within the
cerebral white matter.
5. Stable moderate generalized cerebral atrophy with mild cerebellar
atrophy.

CTA neck:

The common carotid, internal carotid and vertebral arteries are
patent within the neck without hemodynamically significant stenosis.
No significant atherosclerotic disease.

CTA head:

1. No intracranial large vessel occlusion.
2. Moderate focal stenosis within a superior division proximal M2
left MCA branch vessel.
3. Mixed plaque within the intracranial internal carotid arteries
bilaterally. Resultant mild atherosclerotic narrowing of the
cavernous left ICA.

## 2019-06-30 IMAGING — CT CT ANGIO HEAD
1 of 3 series · 7 of 30 positions shown · IV contrast (APPLIED)
Comparison: Brain MRI [DATE]

CLINICAL DATA: Cerebellar ataxia in diseases classified elsewhere.
Ataxia, stroke suspected. Additional history provided by
technologist: Aggravation, memory loss

EXAM:
CT ANGIOGRAPHY HEAD AND NECK
TECHNIQUE: Multidetector CT imaging of the head and neck was performed using
the standard protocol during bolus administration of intravenous
contrast. Multiplanar CT image reconstructions and MIPs were
obtained to evaluate the vascular anatomy. Carotid stenosis
measurements (when applicable) are obtained utilizing NASCET
criteria, using the distal internal carotid diameter as the
denominator.
CONTRAST:  75mL [VN] IOPAMIDOL ([VN]) INJECTION 76%

[Series 7: head/neck angio · axial · 0.44mm/px · z∈[-338,-66]mm · 7 of 182 slices shown]
[im 23/182  brain]
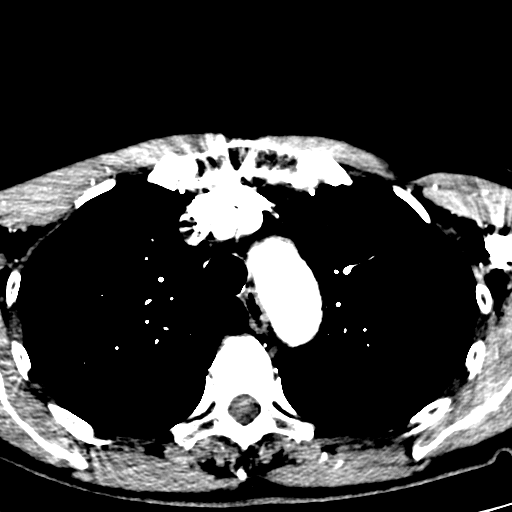
[im 46/182  bone]
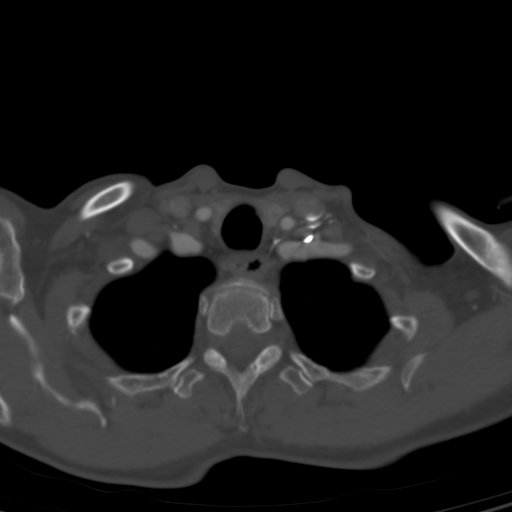
[im 68/182  brain]
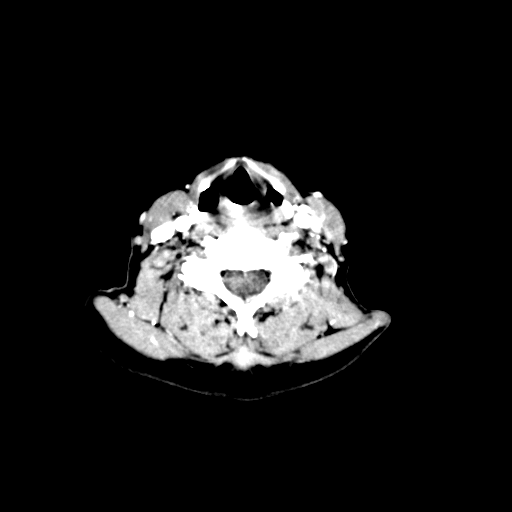
[im 91/182  bone]
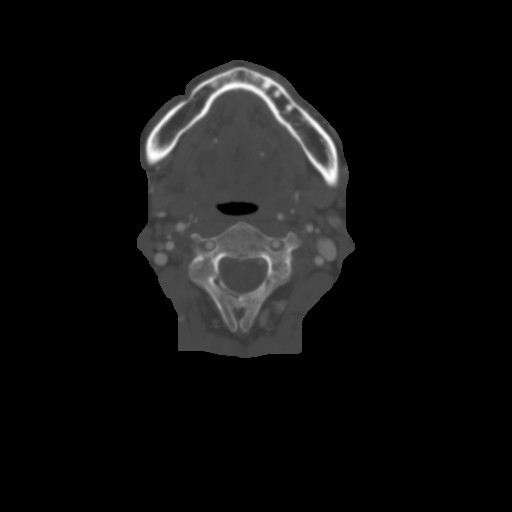
[im 114/182  brain]
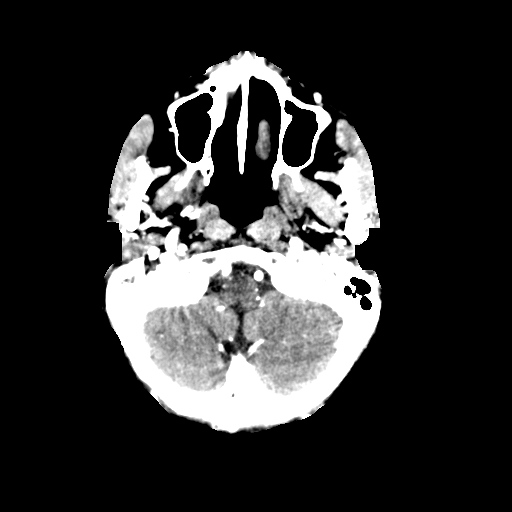
[im 136/182  bone]
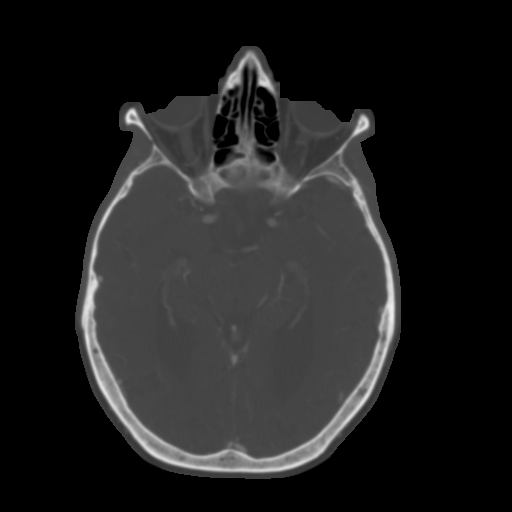
[im 159/182  brain]
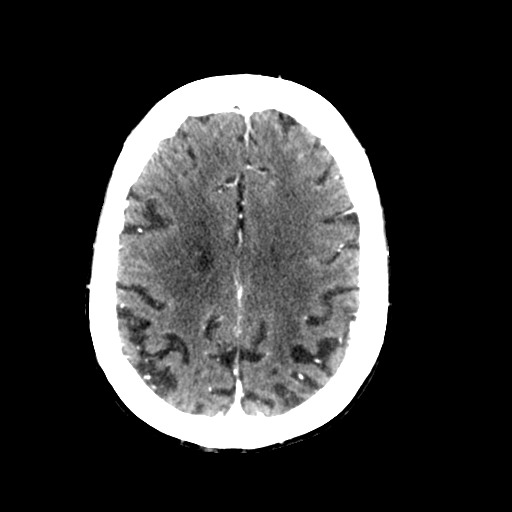

[7 of 30 positions shown; findings below may reference images not displayed]

FINDINGS: CT HEAD FINDINGS

Brain:

Small scattered subacute infarcts within the bilateral cortical and
subcortical frontoparietal lobes were better appreciated on prior
MRI [DATE].

Again demonstrate are multifocal small chronic cortically based
infarcts within the bilateral frontoparietal and occipital lobes.

Redemonstrated small chronic infarcts within the right cerebellum

Moderate patchy hypodensity within the cerebral white matter is
nonspecific, but consistent with chronic small vessel ischemic
disease.

Stable moderate generalized cerebral atrophy with without definite
lobar predominance. Stable mild cerebellar atrophy.

There is no acute intracranial hemorrhage.

No extra-axial fluid collection.

No evidence of intracranial mass.

No midline shift.

Vascular: Reported below.

Skull: Normal. Negative for fracture or focal lesion.

Sinuses: No significant paranasal sinus disease.

Orbits: No acute abnormality.

Review of the MIP images confirms the above findings

CTA NECK FINDINGS

Aortic arch: Standard aortic branching. No hemodynamically
significant innominate or proximal subclavian artery stenosis.

Right carotid system: CCA and ICA patent within the neck without
stenosis. No significant atherosclerotic disease.

Left carotid system: CCA and ICA patent within the neck without
stenosis. No significant atherosclerotic disease.

Vertebral arteries: Codominant and patent within the neck without
stenosis.

Skeleton: No acute bony abnormality or aggressive osseous lesion.

Other neck: Subcentimeter thyroid nodules not meeting consensus
criteria for ultrasound follow-up. No neck mass or pathologically
enlarged cervical chain lymph nodes.

Upper chest: No consolidation within the imaged lung apices.

Review of the MIP images confirms the above findings

CTA HEAD FINDINGS

Anterior circulation:

The intracranial internal carotid arteries are patent. Mixed plaque
within both vessels. No significant stenosis on the right. Mild
stenosis of the cavernous left ICA.

The M1 middle cerebral arteries are patent without significant
stenosis. No M2 proximal branch occlusion is identified. Moderate
focal stenosis within a superior division proximal M2 left MCA
branch vessel (series 15, image 31).

The anterior cerebral arteries are patent without significant
proximal stenosis.

No intracranial aneurysm is identified.

Posterior circulation:

The intracranial internal carotid arteries are patent without
significant stenosis, as is the basilar artery. The posterior
cerebral arteries are patent without significant proximal stenosis.
Posterior communicating arteries are hypoplastic or absent
bilaterally.

Venous sinuses: Within limitations of contrast timing, no convincing
thrombus.

Anatomic variants: As described

Review of the MIP images confirms the above findings
IMPRESSION: CT head:

1. Small subacute infarcts within the bilateral cortical and
subcortical frontoparietal lobes were better appreciated on prior
MRI [DATE].
[DATE]. Redemonstrated multifocal small chronic cortically based infarcts
within the bilateral frontoparietal and occipital lobes.
3. Known small chronic infarcts within the right cerebellum.
4. Stable moderate chronic small vessel ischemic changes within the
cerebral white matter.
5. Stable moderate generalized cerebral atrophy with mild cerebellar
atrophy.

CTA neck:

The common carotid, internal carotid and vertebral arteries are
patent within the neck without hemodynamically significant stenosis.
No significant atherosclerotic disease.

CTA head:

1. No intracranial large vessel occlusion.
2. Moderate focal stenosis within a superior division proximal M2
left MCA branch vessel.
3. Mixed plaque within the intracranial internal carotid arteries
bilaterally. Resultant mild atherosclerotic narrowing of the
cavernous left ICA.

## 2019-06-30 MED ORDER — PREDNISONE 50 MG PO TABS: 50.0000 mg | ORAL_TABLET | Freq: Four times a day (QID) | ORAL | Status: DC

## 2019-06-30 MED ORDER — DIPHENHYDRAMINE HCL 50 MG PO CAPS: 50.0000 mg | ORAL_CAPSULE | Freq: Once | ORAL | Status: DC

## 2019-06-30 MED ORDER — IOPAMIDOL (ISOVUE-370) INJECTION 76%
75.0000 mL | Freq: Once | INTRAVENOUS | Status: AC | PRN
  Administered 2019-06-30: 75 mL via INTRAVENOUS

## 2019-06-30 MED ORDER — DIPHENHYDRAMINE HCL 50 MG/ML IJ SOLN: 50.0000 mg | Freq: Once | INTRAMUSCULAR | Status: DC

## 2019-06-30 NOTE — Telephone Encounter (Signed)
CTA 1. No intracranial large vessel occlusion. 2. Moderate focal stenosis within a superior division proximal M2 left MCA branch vessel. 3. Mixed plaque within the intracranial internal carotid arteries bilaterally. Resultant mild atherosclerotic narrowing of the cavernous left ICA.

## 2019-07-01 NOTE — Telephone Encounter (Signed)
Spoke with Caren Griffins and she stated that Dr. Mitchel Honour has already filled papers out and they have been faxed.

## 2019-07-02 ENCOUNTER — Encounter: Payer: Self-pay | Admitting: Psychology

## 2019-07-02 ENCOUNTER — Other Ambulatory Visit: Payer: Self-pay | Admitting: Neurology

## 2019-07-02 ENCOUNTER — Ambulatory Visit (INDEPENDENT_AMBULATORY_CARE_PROVIDER_SITE_OTHER): Payer: BC Managed Care – PPO | Admitting: Neurology

## 2019-07-02 ENCOUNTER — Ambulatory Visit: Payer: BC Managed Care – PPO | Admitting: Neurology

## 2019-07-02 DIAGNOSIS — F01518 Vascular dementia, unspecified severity, with other behavioral disturbance: Secondary | ICD-10-CM

## 2019-07-02 DIAGNOSIS — I6310 Cerebral infarction due to embolism of unspecified precerebral artery: Secondary | ICD-10-CM | POA: Diagnosis not present

## 2019-07-02 DIAGNOSIS — G5621 Lesion of ulnar nerve, right upper limb: Secondary | ICD-10-CM

## 2019-07-02 DIAGNOSIS — Z0289 Encounter for other administrative examinations: Secondary | ICD-10-CM

## 2019-07-02 DIAGNOSIS — R200 Anesthesia of skin: Secondary | ICD-10-CM | POA: Diagnosis not present

## 2019-07-02 DIAGNOSIS — F0151 Vascular dementia with behavioral disturbance: Secondary | ICD-10-CM

## 2019-07-02 DIAGNOSIS — G939 Disorder of brain, unspecified: Secondary | ICD-10-CM

## 2019-07-02 DIAGNOSIS — I639 Cerebral infarction, unspecified: Secondary | ICD-10-CM | POA: Diagnosis not present

## 2019-07-02 NOTE — Telephone Encounter (Signed)
The pt is here this AM for EMG and [name] said she would discuss with the patient.

## 2019-07-02 NOTE — Addendum Note (Signed)
Addended by: Sarina Ill B on: 07/02/2019 01:28 PM   Modules accepted: Orders, Level of Service

## 2019-07-02 NOTE — Progress Notes (Addendum)
Full Name: Jacqueline Bonilla Gender: Female MRN #: [id number] Date of Birth: June 20, 1955    Visit Date: 07/02/2019 08:17 Age: 64 Years Examining Physician: Sarina Ill, MD  Referring Physician: Agustina Caroli, MD Height: 5 feet 4 inch  History:  Right hand, digits 4-5 are weak with sensory changes, husband says she has fallen and had bruising and trauma at the elbow.  Summary: Evaluation of the right sensory nerve showed delayed distal peak latency (3.8 ms, normal less than 3.1).All remaining nerves  (as indicated in the following tables) were within normal limits.  All muscles (as indicated in the following tables) were within normal limits.       Conclusion: Mild nonlocalizing right ulnar neuropathy.  Sarina Ill M.D.  [facility] [address], [place], [place] [address] Tel: [phone] Fax: [phone]  Verbal informed consent was obtained from the patient, patient was informed of potential risk of procedure, including bruising, bleeding, hematoma formation, infection, muscle weakness, muscle pain, numbness, among others.        [place]    Nerve / Sites Muscle Latency Ref. Amplitude Ref. Rel Amp Segments Distance Velocity Ref. Area    ms ms mV mV %  cm m/s m/s mVms  L Median - APB     Wrist APB 3.7 ?4.4 10.8 ?4.0 100 Wrist - APB 7   52.4     Upper arm APB 7.6  10.9  100 Upper arm - Wrist 21 53 ?49 52.2  R Median - APB     Wrist APB 3.8 ?4.4 7.6 ?4.0 100 Wrist - APB 7   40.3     Upper arm APB 8.3  7.2  95.3 Upper arm - Wrist 22 49 ?49 39.7  L Ulnar - ADM     Wrist ADM 3.0 ?3.3 8.6 ?6.0 100 Wrist - ADM 7   39.2     B.Elbow ADM 6.6  8.8  102 B.Elbow - Wrist 19 53 ?49 37.2     A.Elbow ADM 8.3  8.9  101 A.Elbow - B.Elbow 10 59 ?49 36.0  R Ulnar - ADM     Wrist ADM 3.3 ?3.3 9.1 ?6.0 100 Wrist - ADM 7   38.6     B.Elbow ADM 6.8  8.9  98 B.Elbow - Wrist 19 54 ?49 38.9     A.Elbow ADM 8.4  8.9  101 A.Elbow - B.Elbow 10 59 ?49 37.8  R Ulnar -  FDI     Wrist FDI 3.6 ?4.5 18.7 ?7.0 100 Wrist - FDI 8   56.3     B.Elbow FDI 7.5  16.5  88.3 B.Elbow - Wrist 19 49 ?49 54.0     A.Elbow FDI 9.5  16.4  99.1 A.Elbow - B.Elbow 10 49 ?49 54.4         A.Elbow - Wrist                   SNC    Nerve / Sites Rec. Site Peak Lat Ref.  Amp Ref. Segments Distance Peak Diff Ref.    ms ms V V  cm ms ms  L Median, Ulnar - Transcarpal comparison     Median Palm Wrist 2.1 ?2.2 128 ?35 Median Palm - Wrist 8       Ulnar Palm Wrist 2.3 ?2.2 40 ?12 Ulnar Palm - Wrist 8          Median Palm - Ulnar Palm  -0.2 ?0.4  R  Median, Ulnar - Transcarpal comparison     Median Palm Wrist 2.2 ?2.2 108 ?35 Median Palm - Wrist 8       Ulnar Palm Wrist 2.5 ?2.2 35 ?12 Ulnar Palm - Wrist 8          Median Palm - Ulnar Palm  -0.3 ?0.4  L Median - Orthodromic (Dig II, Mid palm)     Dig II Wrist 3.3 ?3.4 18 ?10 Dig II - Wrist 13    R Median - Orthodromic (Dig II, Mid palm)     Dig II Wrist 3.3 ?3.4 11 ?10 Dig II - Wrist 13    L Ulnar - Orthodromic, (Dig V, Mid palm)     Dig V Wrist 3.1 ?3.1 10 ?5 Dig V - Wrist 11    R Ulnar - Orthodromic, (Dig V, Mid palm)     Dig V Wrist 3.8 ?3.1 13 ?5 Dig V - Wrist 50                   F  Wave    Nerve F Lat Ref.   ms ms  L Ulnar - ADM 28.5 ?32.0  R Ulnar - ADM 29.6 ?32.0         EMG Summary Table    Spontaneous MUAP Recruitment  Muscle IA Fib PSW Fasc Other Amp Dur. Poly Pattern  R. Deltoid Normal None None None _______ Normal Normal Normal Normal  R. Triceps brachii Normal None None None _______ Normal Normal Normal Normal  R. Flexor digitorum profundus (Ulnar) Normal None None None _______ Normal Normal Normal Normal  R. Opponens pollicis Normal None None None _______ Normal Normal Normal Normal  R. Pronator teres Normal None None None _______ Normal Normal Normal Normal  R. First dorsal interosseous Normal None None None _______ Normal Normal Normal Normal

## 2019-07-02 NOTE — Progress Notes (Addendum)
Patient here for follow up with husband. Right hand, digits 4-5 that are weak with sensory changes, husband says she has hurt with bruising at the elbow after falls. I explained she likely damaged the ulnar nerve, we can send to physical therapy and if not improved possibly steroid injections or referral to ortho.  We reviewed images with patient and husband.  We reviewed images together which showed acute, subacute and chronic embolic strokes in addition to moderate to severe chronic microvascular ischemic disease.  We did discuss that this can lead to vascular dementia and falls.  I advised patient not to drive.  CTA of the head and neck showed plaque but nothing extensive and since the strokes were bilateral this is likely the cause of cardioembolic disease.  She has an echo pending, I have referred her to Dr. Rayann Heman for evaluation of loop recorder, her hemoglobin A1c is normal, we reviewed her LDL which is 93 and goal is less than 70 however she declined treatment.  She is also very hesitant about getting a loop recorder.  I did advise her to continue aspirin 325 at this time.  We also talked about fall precautions.  We discussed atrial fibrillation and loop recorder.  Also reviewed lab work which was essentially normal including B12 and other work-up for reversible causes of dementia.   I spent 45 minutes of face-to-face and non-face-to-face time with patient on the  1. 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 Ulnar neuropathy of right upper extremity   3. Cerebrovascular accident (CVA) due to embolism of precerebral artery ([place])   4. Cryptogenic stroke ([place])   5. Chronic non-specific white matter lesions on MRI    diagnosis.  This included previsit chart review, lab review, study review, order entry, electronic health record documentation, patient education on the different diagnostic and therapeutic options, counseling and coordination of care, risks and benefits of management,  compliance, or risk factor reduction

## 2019-07-02 NOTE — Patient Instructions (Signed)
Physical Therapy for ulnar neuropathy and balance Dr. Sima Matas for Formal Memory testing Continue Aspirin, goal ldl < 45, [id number] is normal Fall precautions Referral to cardiology for "loop recorder"   Vascular Dementia Dementia is a condition in which a person has problems with thinking, memory, and behavior that are severe enough to interfere with daily life. Vascular dementia is a type of dementia. It results from brain damage that is caused by the brain not getting enough blood. This condition may also be called vascular cognitive impairment. What are the causes? Vascular dementia is caused by conditions that lessen blood flow to the brain. Common causes of this condition include:  Multiple small strokes. These may happen without symptoms (silent stroke).  Major stroke.  Damage to small blood vessels in the brain (cerebral small vessel disease). What increases the risk? The following factors may make you more likely to develop this condition:  Having had a stroke.  Having high blood pressure (hypertension) or high cholesterol.  Having a disease that affects the heart or blood vessels.  Smoking.  Having diabetes.  Having metabolic syndrome.  Being obese.  Not being active.  Having depression.  Being over age 5. What are the signs or symptoms? Symptoms can vary from one person to another. Symptoms may be mild or severe depending on the amount of damage and which parts of the brain have been affected. Symptoms may begin suddenly or may develop slowly. Mental symptoms of vascular dementia may include:  Confusion.  Memory problems.  Poor attention and concentration.  Trouble understanding speech.  Depression.  Personality changes.  Trouble recognizing familiar people.  Agitation or aggression.  Paranoia.  Delusions or hallucinations. Physical symptoms of vascular dementia may include:  Weakness.  Poor balance.  Loss of bladder or bowel control  (incontinence).  Unsteady walking (gait).  Speaking problems. Behavioral symptoms of vascular dementia may include:  Getting lost in familiar places.  Problems with planning and judgment.  Trouble following instructions.  Social problems.  Emotional outbursts.  Trouble with daily activities and self-care.  Problems handling money. Symptoms may remain stable, or they may get worse over time. Symptoms of vascular dementia may be similar to those of Alzheimer's disease. The two conditions can occur together (mixed dementia). How is this diagnosed? Your health care provider will consider your medical history and symptoms or changes that are reported by friends and family. Your health care provider will do a physical exam and may order lab tests or other tests that check brain and nervous system function. Tests that may be done include:  Blood tests.  Brain imaging tests.  Tests of movement, speech, and other daily activities (neurological exam).  Tests of memory, thinking, and problem-solving (neuropsychological or neurocognitive testing). There is not a specific test to diagnose vascular dementia. Diagnosis may involve several specialists. These may include:  A health care provider who specializes in the brain and nervous system (neurologist).  A health provider who specializes in understanding how problems in the brain can alter behavior and cognitive function (neuropsychologist). How is this treated? There is no cure for vascular dementia. Brain damage that has already occurred cannot be reversed. Treatment depends on:  How severe the condition is.  Which parts of your brain have been affected.  Your overall health. Treatment measures aim to:  Treat the underlying cause of vascular dementia and manage risk factors. This may include: ? Controlling blood pressure. ? Lowering cholesterol. ? Treating diabetes. ? Quitting smoking. ? Losing weight or  maintaining a healthy  weight. ? Eating a healthy, balanced diet. ? Getting regular exercise.  Manage symptoms.  Prevent further brain damage.  Improve the person's health and quality of life. Treatment for dementia may involve a team of health care providers, including:  A neurologist.  A provider who specializes in disorders of the mind (psychiatrist).  A provider who specializes in helping people learn daily living skills (occupational therapist).  A provider who focuses on speech and language changes (Electrical engineer).  A heart specialist (cardiologist).  A provider who helps people learn how to manage physical changes, such as movement and walking (exercise physiologist or physical therapist). Follow these instructions at home: Lifestyle  People with vascular dementia may need regular help at home or daily care from a family member or home health care worker. Home care for a person with vascular dementia depends on what caused the condition and how severe the symptoms are. General guidelines for caregivers include:  Help the person with dementia remember people, appointments, and daily activities.  Help the person with dementia manage his or her medicines.  Help family and friends learn about ways to communicate with the person with dementia.  Create a safe living space to reduce the risk of injury or falls.  Find a support group to help caregivers and family cope with the effects of dementia.  General instructions  Help the person take over-the-counter and prescription medicines only as told by the health care provider.  Follow the health care provider's instructions for treating the condition that caused the dementia.  Make sure the person keeps all follow-up visits as told by the health care provider. This is important. Contact a health care provider if:  A fever develops.  New behavioral problems develop.  Problems with swallowing develop.  Confusion gets  worse.  Sleepiness gets worse. Get help right away if:  Loss of consciousness occurs.  There is a sudden loss of speech, balance, or thinking ability.  New numbness or paralysis occurs.  Sudden, severe headache occurs.  Vision is lost or suddenly gets worse in one or both eyes. Summary  Vascular dementia is a type of dementia. It results from brain damage that is caused by the brain not getting enough blood.  Vascular dementia is caused by conditions that lessen blood flow to the brain. Common causes of this condition include stroke and damage to small blood vessels in the brain.  Treatment focuses on treating the underlying cause of vascular dementia and managing any risk factors.  People with vascular dementia may need regular help at home or daily care from a family member or home health care worker.  Contact a health care provider if you or your caregiver notice any new symptoms. This information is not intended to replace advice given to you by your health care provider. Make sure you discuss any questions you have with your health care provider. Document Revised: 09/18/2017 Document Reviewed: 09/19/2017 Elsevier Patient Education  [place].  Ischemic Stroke  An ischemic stroke (cerebrovascular accident, or CVA) is the sudden death of brain tissue that occurs when an area of the brain does not get enough oxygen. It is a medical emergency that must be treated right away. An ischemic stroke can cause permanent loss of brain function. This can cause problems with how different parts of your body function. What are the causes? This condition is caused by a decrease of oxygen supply to an area of the brain, which may be the  result of:  A small blood clot (embolus) or a buildup of plaque in the blood vessels (atherosclerosis) that blocks blood flow in the brain.  An abnormal heart rhythm (atrial fibrillation).  A blocked or damaged artery in the head or  neck. Sometimes the cause of stroke is not known (cryptogenic). What increases the risk? Certain factors may make you more likely to develop this condition. Some of these factors are things that you can change, such as:  Obesity.  Smoking cigarettes.  Taking oral birth control, especially if you also use tobacco.  Physical inactivity.  Excessive alcohol use.  Use of illegal drugs, especially cocaine and methamphetamine. Other risk factors include:  High blood pressure (hypertension).  High cholesterol.  Diabetes mellitus.  Heart disease.  Being [place] American, Native American, Hispanic, or [place] Native.  Being over age 42.  Family history of stroke.  Previous history of blood clots, stroke, or transient ischemic attack (TIA).  Sickle cell disease.  Being a woman with a history of preeclampsia.  Migraine headache.  Sleep apnea.  Irregular heartbeats, such as atrial fibrillation.  Chronic inflammatory diseases, such as rheumatoid arthritis or lupus.  Blood clotting disorders (hypercoagulable state). What are the signs or symptoms? Symptoms of this condition usually develop suddenly, or you may notice them after waking up from sleep. Symptoms may include sudden:  Weakness or numbness in your face, arm, or leg, especially on one side of your body.  Trouble walking or difficulty moving your arms or legs.  Loss of balance or coordination.  Confusion.  Slurred speech (dysarthria).  Trouble speaking, understanding speech, or both (aphasia).  Vision changes--such as double vision, blurred vision, or loss of vision--in one or both eyes.  Dizziness.  Nausea and vomiting.  Severe headache with no known cause. The headache is often described as the worst headache ever experienced. If possible, make note of the exact time that you last felt like your normal self and what time your symptoms started. Tell your health care provider. If symptoms come and go,  this could be a sign of a warning stroke, or TIA. Get help right away, even if you feel better. How is this diagnosed? This condition may be diagnosed based on:  Your symptoms, your medical history, and a physical exam.  CT scan of the brain.  MRI.  CT angiogram. This test uses a computer to take X-rays of your arteries. A dye may be injected into your blood to show the inside of your blood vessels more clearly.  MRI angiogram. This is a type of MRI that is used to evaluate the blood vessels.  Cerebral angiogram. This test uses X-rays and a dye to show the blood vessels in the brain and neck. You may need to see a health care provider who specializes in stroke care. A stroke specialist can be seen in person or through communication using telephone or television technology (telemedicine). Other tests may also be done to find the cause of the stroke, such as:  Electrocardiogram (ECG).  Continuous heart monitoring.  Echocardiogram.  Transesophageal echocardiogram (TEE).  Carotid ultrasound.  A scan of the brain circulation.  Blood tests.  Sleep study to check for sleep apnea. How is this treated? Treatment for this condition will depend on the duration, severity, and cause of your symptoms and on the area of the brain affected. It is very important to get treatment at the first sign of stroke symptoms. Some treatments work better if they are done within 3-6  hours of the onset of stroke symptoms. These initial treatments may include:  Aspirin.  Medicines to control blood pressure.  Medicine given by injection to dissolve the blood clot (thrombolytic).  Treatments given directly to the affected artery to remove or dissolve the blood clot. Other treatment options may include:  Oxygen.  IV fluids.  Medicines to thin the blood (anticoagulants or antiplatelets).  Procedures to increase blood flow. Medicines and changes to your diet may be used to help treat and manage risk  factors for stroke, such as diabetes, high cholesterol, and high blood pressure. After a stroke, you may work with physical, speech, mental health, or occupational therapists to help you recover. Follow these instructions at home: Medicines  Take over-the-counter and prescription medicines only as told by your health care provider.  If you were told to take a medicine to thin your blood, such as aspirin or an anticoagulant, take it exactly as told by your health care provider. ? Taking too much blood-thinning medicine can cause bleeding. ? If you do not take enough blood-thinning medicine, you will not have the protection that you need against another stroke and other problems.  Understand the side effects of taking anticoagulant medicine. When taking this type of medicine, make sure you: ? Hold pressure over any cuts for longer than usual. ? Tell your dentist and other health care providers that you are taking anticoagulants before you have any procedures that may cause bleeding. ? Avoid activities that may cause trauma or injury. Eating and drinking  Follow instructions from your health care provider about diet.  Eat healthy foods.  If your ability to swallow was affected by the stroke, you may need to take steps to avoid choking, such as: ? Taking small bites when eating. ? Eating foods that are soft or pureed. Safety  Follow instructions from your health care team about physical activity.  Use a walker or cane as told by your health care provider.  Take steps to create a safe home environment in order to reduce the risk of falls. This may include: ? Having your home looked at by specialists. ? Installing grab bars in the bedroom and bathroom. ? Using safety equipment, such as raised toilets and a seat in the shower. General instructions  Do not use any tobacco products, such as cigarettes, chewing tobacco, and e-cigarettes. If you need help quitting, ask your health care  provider.  Limit alcohol intake to no more than 1 drink a day for nonpregnant women and 2 drinks a day for men. One drink equals 12 oz of beer, 5 oz of wine, or 1 oz of hard liquor.  If you need help to stop using drugs or alcohol, ask your health care provider about a referral to a program or specialist.  Maintain an active and healthy lifestyle. Get regular exercise as told by your health care provider.  Keep all follow-up visits as told by your health care provider, including visits with all specialists on your health care team. This is important. How is this prevented? Your risk of another stroke can be decreased by managing high blood pressure, high cholesterol, diabetes, heart disease, sleep apnea, and obesity. It can also be decreased by quitting smoking, limiting alcohol, and staying physically active. Your health care provider will continue to work with you on measures to prevent short-term and long-term complications of stroke. Get help right away if:   You have any symptoms of a stroke. "BE FAST" is an easy way  to remember the main warning signs of a stroke: ? B - Balance. Signs are dizziness, sudden trouble walking, or loss of balance. ? E - Eyes. Signs are trouble seeing or a sudden change in vision. ? F - Face. Signs are sudden weakness or numbness of the face, or the face or eyelid drooping on one side. ? A - Arms. Signs are weakness or numbness in an arm. This happens suddenly and usually on one side of the body. ? S - Speech. Signs are sudden trouble speaking, slurred speech, or trouble understanding what people say. ? T - Time. Time to call emergency services. Write down what time symptoms started.  You have other signs of a stroke, such as: ? A sudden, severe headache with no known cause. ? Nausea or vomiting. ? Seizure.  These symptoms may represent a serious problem that is an emergency. Do not wait to see if the symptoms will go away. Get medical help right away.  Call your local emergency services (911 in the [place] Do not drive yourself to the hospital. Summary  An ischemic stroke (cerebrovascular accident, or CVA) is the sudden death of brain tissue that occurs when an area of the brain does not get enough oxygen.  Symptoms of this condition usually develop suddenly, or you may notice them after waking up from sleep.  It is very important to get treatment at the first sign of stroke symptoms. Stroke is a medical emergency that must be treated right away. This information is not intended to replace advice given to you by your health care provider. Make sure you discuss any questions you have with your health care provider. Document Revised: 09/06/2017 Document Reviewed: 03/16/2015 Elsevier Patient Education  [place].   Atrial Fibrillation  Atrial fibrillation is a type of irregular or rapid heartbeat (arrhythmia). In atrial fibrillation, the top part of the heart (atria) beats in an irregular pattern. This makes the heart unable to pump blood normally and effectively. The goal of treatment is to prevent blood clots from forming, control your heart rate, or restore your heartbeat to a normal rhythm. If this condition is not treated, it can cause serious problems, such as a weakened heart muscle (cardiomyopathy) or a stroke. What are the causes? This condition is often caused by medical conditions that damage the heart's electrical system. These include:  High blood pressure (hypertension). This is the most common cause.  Certain heart problems or conditions, such as heart failure, coronary artery disease, heart valve problems, or heart surgery.  Diabetes.  Overactive thyroid (hyperthyroidism).  Obesity.  Chronic kidney disease. In some cases, the cause of this condition is not known. What increases the risk? This condition is more likely to develop in:  Older people.  People who smoke.  Athletes who do endurance  exercise.  People who have a family history of atrial fibrillation.  Men.  People who use drugs.  People who drink a lot of alcohol.  People who have lung conditions, such as emphysema, pneumonia, or COPD.  People who have obstructive sleep apnea. What are the signs or symptoms? Symptoms of this condition include:  A feeling that your heart is racing or beating irregularly.  Discomfort or pain in your chest.  Shortness of breath.  Sudden light-headedness or weakness.  Tiring easily during exercise or activity.  Fatigue.  Syncope (fainting).  Sweating. In some cases, there are no symptoms. How is this diagnosed? Your health care provider may detect atrial fibrillation when  taking your pulse. If detected, this condition may be diagnosed with:  An electrocardiogram (ECG) to check electrical signals of the heart.  An ambulatory cardiac monitor to record your heart's activity for a few days.  A transthoracic echocardiogram (TTE) to create pictures of your heart.  A transesophageal echocardiogram (TEE) to create even closer pictures of your heart.  A stress test to check your blood supply while you exercise.  Imaging tests, such as a CT scan or chest X-ray.  Blood tests. How is this treated? Treatment depends on underlying conditions and how you feel when you experience atrial fibrillation. This condition may be treated with:  Medicines to prevent blood clots or to treat heart rate or heart rhythm problems.  Electrical cardioversion to reset the heart's rhythm.  A pacemaker to correct abnormal heart rhythm.  Ablation to remove the heart tissue that sends abnormal signals.  Left atrial appendage closure to seal the area where blood clots can form. In some cases, underlying conditions will be treated. Follow these instructions at home: Medicines  Take over-the counter and prescription medicines only as told by your health care provider.  Do not take any new  medicines without talking to your health care provider.  If you are taking blood thinners: ? Talk with your health care provider before you take any medicines that contain aspirin or NSAIDs, such as ibuprofen. These medicines increase your risk for dangerous bleeding. ? Take your medicine exactly as told, at the same time every day. ? Avoid activities that could cause injury or bruising, and follow instructions about how to prevent falls. ? Wear a medical alert bracelet or carry a card that lists what medicines you take. Lifestyle      Do not use any products that contain nicotine or tobacco, such as cigarettes, e-cigarettes, and chewing tobacco. If you need help quitting, ask your health care provider.  Eat heart-healthy foods. Talk with a dietitian to make an eating plan that is right for you.  Exercise regularly as told by your health care provider.  Do not drink alcohol.  Lose weight if you are overweight.  Do not use drugs, including cannabis. General instructions  If you have obstructive sleep apnea, manage your condition as told by your health care provider.  Do not use diet pills unless your health care provider approves. Diet pills can make heart problems worse.  Keep all follow-up visits as told by your health care provider. This is important. Contact a health care provider if you:  Notice a change in the rate, rhythm, or strength of your heartbeat.  Are taking a blood thinner and you notice more bruising.  Tire more easily when you exercise or do heavy work.  Have a sudden change in weight. Get help right away if you have:   Chest pain, abdominal pain, sweating, or weakness.  Trouble breathing.  Side effects of blood thinners, such as blood in your vomit, stool, or urine, or bleeding that cannot stop.  Any symptoms of a stroke. "BE FAST" is an easy way to remember the main warning signs of a stroke: ? B - Balance. Signs are dizziness, sudden trouble  walking, or loss of balance. ? E - Eyes. Signs are trouble seeing or a sudden change in vision. ? F - Face. Signs are sudden weakness or numbness of the face, or the face or eyelid drooping on one side. ? A - Arms. Signs are weakness or numbness in an arm. This happens suddenly and  usually on one side of the body. ? S - Speech. Signs are sudden trouble speaking, slurred speech, or trouble understanding what people say. ? T - Time. Time to call emergency services. Write down what time symptoms started.  Other signs of a stroke, such as: ? A sudden, severe headache with no known cause. ? Nausea or vomiting. ? Seizure. These symptoms may represent a serious problem that is an emergency. Do not wait to see if the symptoms will go away. Get medical help right away. Call your local emergency services (911 in the [place] Do not drive yourself to the hospital. Summary  Atrial fibrillation is a type of irregular or rapid heartbeat (arrhythmia).  Symptoms include a feeling that your heart is beating fast or irregularly.  You may be given medicines to prevent blood clots or to treat heart rate or heart rhythm problems.  Get help right away if you have signs or symptoms of a stroke.  Get help right away if you cannot catch your breath or have chest pain or pressure. This information is not intended to replace advice given to you by your health care provider. Make sure you discuss any questions you have with your health care provider. Document Revised: 06/11/2018 Document Reviewed: 06/11/2018 Elsevier Patient Education  [place] Tunnel Syndrome  Cubital tunnel syndrome is a condition that causes pain and weakness of the forearm and hand. It happens when one of the nerves that runs along the inside of the elbow joint (ulnar nerve) becomes irritated. This condition is usually caused by repeated arm motions that are done during sports or work-related activities. What are the  causes? This condition may be caused by:  Increased pressure on the ulnar nerve at the elbow, arm, or forearm. This can result from: ? Irritation caused by repeated elbow bending. ? Poorly healed elbow fractures. ? Tumors in the elbow. These are usually noncancerous (benign). ? Scar tissue that develops in the elbow after an injury. ? Bony growths (spurs) near the ulnar nerve.  Stretching of the nerve due to loose elbow ligaments.  Trauma to the nerve at the elbow. What increases the risk? The following factors may make you more likely to develop this condition:  Doing manual labor that requires frequent bending of the elbow.  Playing sports that include repeated or strenuous throwing motions, such as baseball.  Playing contact sports, such as football or lacrosse.  Not warming up properly before activities.  Having diabetes.  Having an underactive thyroid (hypothyroidism). What are the signs or symptoms? Symptoms of this condition include:  Clumsiness or weakness of the hand.  Tenderness of the inner elbow.  Aching or soreness of the inner elbow, forearm, or fingers, especially the little finger or the ring finger.  Increased pain when forcing the elbow to bend.  Reduced control when throwing objects.  Tingling, numbness, or a burning feeling inside the forearm or in part of the hand or fingers, especially the little finger or the ring finger.  Sharp pains that shoot from the elbow down to the wrist and hand.  The inability to grip or pinch hard. How is this diagnosed? This condition is diagnosed based on:  Your symptoms and medical history. Your health care provider will also ask for details about any injury.  A physical exam. You may also have tests, including:  Electromyogram (EMG). This test measures electrical signals sent by your nerves into the muscles.  Nerve conduction study. This test  measures how well electrical signals pass through your  nerves.  Imaging tests, such as X-rays, ultrasound, and MRI. These tests check for possible causes of your condition. How is this treated? This condition may be treated by:  Stopping the activities that are causing your symptoms to get worse.  Icing and taking medicines to reduce pain and swelling.  Wearing a splint to prevent your elbow from bending, or wearing an elbow pad where the ulnar nerve is closest to the skin.  Working with a physical therapist in less severe cases. This may help to: ? Decrease your symptoms. ? Improve the strength and range of motion of your elbow, forearm, and hand. If these treatments do not help, surgery may be needed. Follow these instructions at home: If you have a splint:  Wear the splint as told by your health care provider. Remove it only as told by your health care provider.  Loosen the splint if your fingers tingle, become numb, or turn cold and blue.  Keep the splint clean.  If the splint is not waterproof: ? Do not let it get wet. ? Cover it with a watertight covering when you take a bath or shower. Managing pain, stiffness, and swelling   If directed, put ice on the injured area: ? Put ice in a plastic bag. ? Place a towel between your skin and the bag. ? Leave the ice on for 20 minutes, 2-3 times a day.  Move your fingers often to avoid stiffness and to lessen swelling.  Raise (elevate) the injured area above the level of your heart while you are sitting or lying down. General instructions  Take over-the-counter and prescription medicines only as told by your health care provider.  Do any exercise or physical therapy as told by your health care provider.  Do not drive or use heavy machinery while taking prescription pain medicine.  If you were given an elbow pad, wear it as told by your health care provider.  Keep all follow-up visits as told by your health care provider. This is important. Contact a health care provider  if:  Your symptoms get worse.  Your symptoms do not get better with treatment.  You have new pain.  Your hand on the injured side feels numb or cold. Summary  Cubital tunnel syndrome is a condition that causes pain and weakness of the forearm and hand.  You are more likely to develop this condition if you do work or play sports that involve repeated arm movements.  This condition is often treated by stopping repetitive activities, applying ice, and using anti-inflammatory medicines.  In rare cases, surgery may be needed. This information is not intended to replace advice given to you by your health care provider. Make sure you discuss any questions you have with your health care provider. Document Revised: 05/06/2017 Document Reviewed: 05/06/2017 Elsevier Patient Education  [place] Prevention in the Home, Adult Falls can cause injuries. They can happen to people of all ages. There are many things you can do to make your home safe and to help prevent falls. Ask for help when making these changes, if needed. What actions can I take to prevent falls? General Instructions  Use good lighting in all rooms. Replace any light bulbs that burn out.  Turn on the lights when you go into a dark area. Use night-lights.  Keep items that you use often in easy-to-reach places. Lower the shelves around your home if necessary.  Set up your furniture so you have a clear path. Avoid moving your furniture around.  Do not have throw rugs and other things on the floor that can make you trip.  Avoid walking on wet floors.  If any of your floors are uneven, fix them.  Add color or contrast paint or tape to clearly mark and help you see: ? Any grab bars or handrails. ? First and last steps of stairways. ? Where the edge of each step is.  If you use a stepladder: ? Make sure that it is fully opened. Do not climb a closed stepladder. ? Make sure that both sides of the  stepladder are locked into place. ? Ask someone to hold the stepladder for you while you use it.  If there are any pets around you, be aware of where they are. What can I do in the bathroom?      Keep the floor dry. Clean up any water that spills onto the floor as soon as it happens.  Remove soap buildup in the tub or shower regularly.  Use non-skid mats or decals on the floor of the tub or shower.  Attach bath mats securely with double-sided, non-slip rug tape.  If you need to sit down in the shower, use a plastic, non-slip stool.  Install grab bars by the toilet and in the tub and shower. Do not use towel bars as grab bars. What can I do in the bedroom?  Make sure that you have a light by your bed that is easy to reach.  Do not use any sheets or blankets that are too big for your bed. They should not hang down onto the floor.  Have a firm chair that has side arms. You can use this for support while you get dressed. What can I do in the kitchen?  Clean up any spills right away.  If you need to reach something above you, use a strong step stool that has a grab bar.  Keep electrical cords out of the way.  Do not use floor polish or wax that makes floors slippery. If you must use wax, use non-skid floor wax. What can I do with my stairs?  Do not leave any items on the stairs.  Make sure that you have a light switch at the top of the stairs and the bottom of the stairs. If you do not have them, ask someone to add them for you.  Make sure that there are handrails on both sides of the stairs, and use them. Fix handrails that are broken or loose. Make sure that handrails are as long as the stairways.  Install non-slip stair treads on all stairs in your home.  Avoid having throw rugs at the top or bottom of the stairs. If you do have throw rugs, attach them to the floor with carpet tape.  Choose a carpet that does not hide the edge of the steps on the stairway.  Check any  carpeting to make sure that it is firmly attached to the stairs. Fix any carpet that is loose or worn. What can I do on the outside of my home?  Use bright outdoor lighting.  Regularly fix the edges of walkways and driveways and fix any cracks.  Remove anything that might make you trip as you walk through a door, such as a raised step or threshold.  Trim any bushes or trees on the path to your home.  Regularly check to see if  handrails are loose or broken. Make sure that both sides of any steps have handrails.  Install guardrails along the edges of any raised decks and porches.  Clear walking paths of anything that might make someone trip, such as tools or rocks.  Have any leaves, snow, or ice cleared regularly.  Use sand or salt on walking paths during winter.  Clean up any spills in your garage right away. This includes grease or oil spills. What other actions can I take?  Wear shoes that: ? Have a low heel. Do not wear high heels. ? Have rubber bottoms. ? Are comfortable and fit you well. ? Are closed at the toe. Do not wear open-toe sandals.  Use tools that help you move around (mobility aids) if they are needed. These include: ? Canes. ? Walkers. ? Scooters. ? Crutches.  Review your medicines with your doctor. Some medicines can make you feel dizzy. This can increase your chance of falling. Ask your doctor what other things you can do to help prevent falls. Where to find more information  Centers for Disease Control and Prevention, [facility]: https://garcia.biz/  [employer] on Aging: BrainJudge.co.uk Contact a doctor if:  You are afraid of falling at home.  You feel weak, drowsy, or dizzy at home.  You fall at home. Summary  There are many simple things that you can do to make your home safe and to help prevent falls.  Ways to make your home safe include removing tripping hazards and installing grab bars in the bathroom.  Ask for help when  making these changes in your home. This information is not intended to replace advice given to you by your health care provider. Make sure you discuss any questions you have with your health care provider. Document Revised: 04/10/2018 Document Reviewed: 08/02/2016 Elsevier Patient Education  2020 [employer].

## 2019-07-02 NOTE — Procedures (Signed)
Full Name: Jacqueline Bonilla Gender: Female MRN #: [id number] Date of Birth: [birth year]    Visit Date: 07/02/2019 08:17 Age: 63 Years Examining Physician: Sarina Ill, MD  Referring Physician: Agustina Caroli, MD Height: 5 feet 4 inch  History:  Right hand, digits 4-5 are weak with sensory changes, husband says she has fallen and had bruising and trauma at the elbow.  Summary: Evaluation of the right sensory nerve showed delayed distal peak latency (3.8 ms, normal less than 3.1).All remaining nerves  (as indicated in the following tables) were within normal limits.  All muscles (as indicated in the following tables) were within normal limits.       Conclusion: Mild nonlocalizing right ulnar neuropathy.  Sarina Ill M.D.  [facility] [address], [place], [place] [address] Tel: [phone] Fax: [phone]  Verbal informed consent was obtained from the patient, patient was informed of potential risk of procedure, including bruising, bleeding, hematoma formation, infection, muscle weakness, muscle pain, numbness, among others.        [place]    Nerve / Sites Muscle Latency Ref. Amplitude Ref. Rel Amp Segments Distance Velocity Ref. Area    ms ms mV mV %  cm m/s m/s mVms  L Median - APB     Wrist APB 3.7 ?4.4 10.8 ?4.0 100 Wrist - APB 7   52.4     Upper arm APB 7.6  10.9  100 Upper arm - Wrist 21 53 ?49 52.2  R Median - APB     Wrist APB 3.8 ?4.4 7.6 ?4.0 100 Wrist - APB 7   40.3     Upper arm APB 8.3  7.2  95.3 Upper arm - Wrist 22 49 ?49 39.7  L Ulnar - ADM     Wrist ADM 3.0 ?3.3 8.6 ?6.0 100 Wrist - ADM 7   39.2     B.Elbow ADM 6.6  8.8  102 B.Elbow - Wrist 19 53 ?49 37.2     A.Elbow ADM 8.3  8.9  101 A.Elbow - B.Elbow 10 59 ?49 36.0  R Ulnar - ADM     Wrist ADM 3.3 ?3.3 9.1 ?6.0 100 Wrist - ADM 7   38.6     B.Elbow ADM 6.8  8.9  98 B.Elbow - Wrist 19 54 ?49 38.9     A.Elbow ADM 8.4  8.9  101 A.Elbow - B.Elbow 10 59 ?49 37.8  R Ulnar -  FDI     Wrist FDI 3.6 ?4.5 18.7 ?7.0 100 Wrist - FDI 8   56.3     B.Elbow FDI 7.5  16.5  88.3 B.Elbow - Wrist 19 49 ?49 54.0     A.Elbow FDI 9.5  16.4  99.1 A.Elbow - B.Elbow 10 49 ?49 54.4         A.Elbow - Wrist                   SNC    Nerve / Sites Rec. Site Peak Lat Ref.  Amp Ref. Segments Distance Peak Diff Ref.    ms ms V V  cm ms ms  L Median, Ulnar - Transcarpal comparison     Median Palm Wrist 2.1 ?2.2 128 ?35 Median Palm - Wrist 8       Ulnar Palm Wrist 2.3 ?2.2 40 ?12 Ulnar Palm - Wrist 8          Median Palm - Ulnar Palm  -0.2 ?0.4  R  Median, Ulnar - Transcarpal comparison     Median Palm Wrist 2.2 ?2.2 108 ?35 Median Palm - Wrist 8       Ulnar Palm Wrist 2.5 ?2.2 35 ?12 Ulnar Palm - Wrist 8          Median Palm - Ulnar Palm  -0.3 ?0.4  L Median - Orthodromic (Dig II, Mid palm)     Dig II Wrist 3.3 ?3.4 18 ?10 Dig II - Wrist 13    R Median - Orthodromic (Dig II, Mid palm)     Dig II Wrist 3.3 ?3.4 11 ?10 Dig II - Wrist 13    L Ulnar - Orthodromic, (Dig V, Mid palm)     Dig V Wrist 3.1 ?3.1 10 ?5 Dig V - Wrist 11    R Ulnar - Orthodromic, (Dig V, Mid palm)     Dig V Wrist 3.8 ?3.1 13 ?5 Dig V - Wrist 33                   F  Wave    Nerve F Lat Ref.   ms ms  L Ulnar - ADM 28.5 ?32.0  R Ulnar - ADM 29.6 ?32.0         EMG Summary Table    Spontaneous MUAP Recruitment  Muscle IA Fib PSW Fasc Other Amp Dur. Poly Pattern  R. Deltoid Normal None None None _______ Normal Normal Normal Normal  R. Triceps brachii Normal None None None _______ Normal Normal Normal Normal  R. Flexor digitorum profundus (Ulnar) Normal None None None _______ Normal Normal Normal Normal  R. Opponens pollicis Normal None None None _______ Normal Normal Normal Normal  R. Pronator teres Normal None None None _______ Normal Normal Normal Normal  R. First dorsal interosseous Normal None None None _______ Normal Normal Normal Normal

## 2019-07-02 NOTE — Telephone Encounter (Signed)
I reviewed the blood vessels and nothing seen to cause the strokes. The most likely cause is cardio-embolic disease such as atrial fibrillation and I am going to send her to cardiology for evaluation if she doesn't already have a cardiologist. Continue aspirin. And we will discuss further at follow up appointment.

## 2019-07-02 NOTE — Progress Notes (Signed)
See procedure note.

## 2019-07-07 DIAGNOSIS — H2513 Age-related nuclear cataract, bilateral: Secondary | ICD-10-CM | POA: Diagnosis not present

## 2019-07-13 ENCOUNTER — Other Ambulatory Visit: Payer: BC Managed Care – PPO

## 2019-07-13 ENCOUNTER — Inpatient Hospital Stay: Admission: RE | Admit: 2019-07-13 | Payer: BC Managed Care – PPO | Source: Ambulatory Visit

## 2019-07-15 ENCOUNTER — Ambulatory Visit: Payer: BC Managed Care – PPO | Admitting: Emergency Medicine

## 2019-07-15 ENCOUNTER — Encounter: Payer: Self-pay | Admitting: Emergency Medicine

## 2019-07-15 ENCOUNTER — Other Ambulatory Visit: Payer: Self-pay

## 2019-07-15 VITALS — BP 118/75 | HR 104 | Temp 98.0°F | Resp 16 | Ht 64.0 in | Wt 107.0 lb

## 2019-07-15 DIAGNOSIS — F0150 Vascular dementia without behavioral disturbance: Secondary | ICD-10-CM

## 2019-07-15 DIAGNOSIS — S01111A Laceration without foreign body of right eyelid and periocular area, initial encounter: Secondary | ICD-10-CM | POA: Diagnosis not present

## 2019-07-15 DIAGNOSIS — W19XXXA Unspecified fall, initial encounter: Secondary | ICD-10-CM

## 2019-07-15 DIAGNOSIS — S0993XA Unspecified injury of face, initial encounter: Secondary | ICD-10-CM | POA: Diagnosis not present

## 2019-07-15 NOTE — Progress Notes (Addendum)
[name] 64 y.o.   Chief Complaint  Patient presents with  . Fall    this morning  . Laceration    this morning above the RIGHT eye with bleeding    HISTORY OF PRESENT ILLNESS: This is a 64 y.o. female fell today 640am after losing balance at home.  Hit right eye area sustaining small laceration to eyebrow area.  Denies LOC.  States it was dark and she could not see very well.  No other injuries. No other complaints or medical concerns today.  HPI   Prior to Admission medications   Medication Sig Start Date End Date Taking? Authorizing Provider  Cholecalciferol (VITAMIN D3 GUMMIES PO) Take 1,000 mcg by mouth daily.   Yes [provider]  Cyanocobalamin (VITAMIN B 12 PO) Take 1,000 mcg by mouth daily.   Yes [provider]  MAGNESIUM PO Take by mouth as needed.    Yes [provider]  MELATONIN PO Take 10 mg by mouth daily.   Yes [provider]  Multiple Vitamins-Minerals (AIRBORNE GUMMIES) CHEW Chew by mouth daily.   Yes [provider]  Multiple Vitamins-Minerals (HAIR SKIN AND NAILS FORMULA PO) Take 2,500 mcg by mouth daily.   Yes [provider]  OVER THE COUNTER MEDICATION daily.   Yes [provider]  Probiotic Product (PROBIOTIC DAILY PO) Take by mouth daily.   Yes [provider]  UNABLE TO FIND Med Name: "striction D" (?) for blood pressure    Yes [provider]  predniSONE (DELTASONE) 50 MG tablet Take 1 pill by mouth 13 hours, 7 hours  and 1 hour before CT for a total of 3 doses of prednisone. Take 50 mg of Benadryl by mouth with the prednisone 1 hour before CT for a total of one dose of benadryl. Patient not taking: Reported on 07/15/2019 06/25/19   Marcial Pacas, MD    Allergies  Allergen Reactions  . Avelox [Moxifloxacin]   . Ciprofloxacin Hives  . Floraquin [Iodoquinol]     No cipro or others  . Iohexol      Code: HIVES, Desc: HIVES S/P IVP MANY YRS AGO...NO PROBLEM W/O PREP  TODAY/A.[place], Onset Date: [id number]   . Levaquin [Levofloxacin]   . Quinolones   . Shellfish Allergy Itching    Itching under the chin. Described by patient but not seen by husband.    Patient Active Problem List   Diagnosis Date Noted  . Cerebrovascular accident ([place]) 06/25/2019  . Brain fog 04/22/2019  . History of COVID-19 04/22/2019  . Essential hypertension 03/03/2019  . Hypertensive urgency 03/03/2019    Past Medical History:  Diagnosis Date  . Anemia   . Anxiety   . Depression   . Dysmenorrhea   . Endometriosis   . Fibroid   . Hypertension   . Microhematuria    negative workup  . Osteoporosis   . Tachycardia   . Thrombocytopenia ([place])     Past Surgical History:  Procedure Laterality Date  . BREAST BIOPSY    . CESAREAN SECTION    . hysteroscopic resection      Social History   Socioeconomic History  . Marital status: Married    Spouse name: Not on file  . Number of children: 1  . Years of education: Not on file  . Highest education level: Not on file  Occupational History  . Occupation: accounting  Tobacco Use  . Smoking status: Never Smoker  . Smokeless tobacco: Never Used  Vaping Use  . Vaping Use: Never used  Substance and Sexual Activity  . Alcohol use: Never  . Drug use: Never  . Sexual activity: Not Currently    Partners: Male    Comment: husband vasectomy  Other Topics Concern  . 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 Difficulty of Paying Living Expenses:   Food Insecurity:   . Worried About Charity fundraiser in the Last Year:   . Arboriculturist in the Last Year:   Transportation Needs:   . Film/video editor (Medical):   Marland Kitchen Lack of Transportation (Non-Medical):   Physical Activity:    . Days of Exercise per Week:   . Minutes of Exercise per Session:   Stress:   . Feeling of Stress :   Social Connections:   . Frequency of Communication with Friends and Family:   . Frequency of Social Gatherings with Friends and Family:   . Attends Religious Services:   . Active Member of Clubs or Organizations:   . Attends Archivist Meetings:   Marland Kitchen Marital Status:   Intimate Partner Violence:   . Fear of Current or Ex-Partner:   . Emotionally Abused:   Marland Kitchen Physically Abused:   . Sexually Abused:     Family History  Problem Relation Age of Onset  . Cancer Father        non hodgkin lymphoma & skin  . Heart attack Maternal Grandfather   . Dementia Mother   . Polymyositis Sister      Review of Systems  Constitutional: Negative.  Negative for chills and fever.  HENT: Negative.  Negative for congestion and sore throat.   Eyes: Negative for blurred vision, double vision and pain.  Respiratory: Negative.  Negative for cough and shortness of breath.   Cardiovascular: Negative.  Negative for chest pain and palpitations.  Gastrointestinal: Negative for abdominal pain, diarrhea, nausea and vomiting.  Neurological: Negative for dizziness and headaches.  All other systems reviewed and are negative.  Today's Vitals   07/15/19 1055  BP: 118/75  Pulse: (!) 104  Resp: 16  Temp: 98 F (36.7 C)  TempSrc: Temporal  SpO2: 95%  Weight: 107 lb (48.5 kg)  Height: 5\' 4"  (1.626 m)   Body mass index is 18.37 kg/m. Wt Readings from Last 3 Encounters:  07/15/19 107 lb (48.5 kg)  06/11/19 113 lb (51.3 kg)  06/11/19 112 lb (50.8 kg)     Physical Exam Vitals reviewed.  Constitutional:      Appearance: Normal appearance.  HENT:     Head: Normocephalic.     Right Ear: Tympanic membrane, ear canal and external ear normal.     Left Ear: Tympanic membrane, ear canal and external ear normal.     Mouth/Throat:     Mouth: Mucous membranes are moist.     Pharynx: Oropharynx is  clear.  Eyes:     Extraocular Movements: Extraocular movements intact.     Conjunctiva/sclera: Conjunctivae normal.     Pupils: Pupils are equal, round, and reactive to light.  Cardiovascular:     Rate and Rhythm: Normal rate  and regular rhythm.     Heart sounds: Normal heart sounds.  Pulmonary:     Effort: Pulmonary effort is normal.     Breath sounds: Normal breath sounds.  Musculoskeletal:     Cervical back: Normal range of motion and neck supple.  Skin:    General: Skin is warm and dry.     Capillary Refill: Capillary refill takes less than 2 seconds.     Comments: Small laceration to right eyebrow area.  Neurological:     General: No focal deficit present.     Mental Status: She is alert and oriented to person, place, and time.  Psychiatric:        Mood and Affect: Mood normal.        Behavior: Behavior normal.    Procedure note: Area cleansed with Betadine and laceration closed with Dermabond.  ASSESSMENT & PLAN: Chiana was seen today for fall and laceration.  Diagnoses and all orders for this visit:  Accidental fall, initial encounter  Facial injury, initial encounter  Laceration of right eyebrow, initial encounter  Vascular dementia without behavioral disturbance [facility])    Patient Instructions       If you have lab work done today you will be contacted with your lab results within the next 2 weeks.  If you have not heard from [name] then please contact us. The fastest way to get your results is to register for My Chart.   IF you received an x-ray today, you will receive an invoice from [facility]. Please contact [facility] at [phone] with questions or concerns regarding your invoice.   IF you received labwork today, you will receive an invoice from [facility]. Please contact [facility] at [phone] with questions or concerns regarding your invoice.   Our billing staff will not be able to assist you with questions regarding bills from  these companies.  You will be contacted with the lab results as soon as they are available. The fastest way to get your results is to activate your My Chart account. Instructions are located on the last page of this paperwork. If you have not heard from [name] regarding the results in 2 weeks, please contact this office.     Tissue Adhesive Wound Care Some cuts and wounds can be closed with skin glue (tissue adhesive). Skin glue holds the skin together and helps your wound heal faster. Skin glue goes away on its own as your wound gets better. Follow these instructions at home:  Wound care  Showers are allowed 24 hours after treatment. Do not soak the wound in water. Do not take baths, swim, or use hot tubs. Do not use soaps or creams on your wound.  If a bandage (dressing) was put on the wound: ? Wash your hands with soap and water before you change your bandage. ? Change the bandage as often as told by your doctor. ? Leave skin glue in place. It will fall off on its own after 7-10 days. ? Keep the bandage dry.  Do not scratch, rub, or pick at the skin glue.  Do not put tape over the skin glue. The skin glue could come off when you take the tape off.  Protect the wound from another injury.  Protect the wound from sun and tanning beds. General instructions  Take over-the-counter and prescription medicines only as told by your doctor.  Keep all follow-up visits as told by your doctor. This is important. Get help right away if:  Your  wound is red, puffy (swollen), hot, or tender.  You get a rash after the glue is put on.  You have more pain in the wound.  You have a red streak going away from the wound.  You have yellowish-white fluid (pus) coming from the wound.  You have more bleeding.  You have a fever.  You have chills and you start to shake.  You notice a bad smell coming from the wound.  Your wound or skin glue breaks open. This information is not intended to  replace advice given to you by your health care provider. Make sure you discuss any questions you have with your health care provider. Document Revised: 11/30/2016 Document Reviewed: 11/11/2015 Elsevier Patient Education  2020 [place] and Fractures  Falls can be very serious, especially for older adults or people with osteoporosis  Falls can be caused by:  Tripping or slipping  Slow reflexes  Balance problems  Reduced muscle strength  Poor vision or a recent change in prescription  Illness and some medications (especially blood pressure pills, diuretics, heart medicines, muscle relaxants and sleep medications)  Drinking alcohol  To prevent falls outdoors:  Use a can or walker if needed  Wear rubber-soled shoes so you don't slip  DO NOT buy "shape up" shoes with rocker bottom soles if you have balance problems.  The thick soles and shape make it more difficult to keep your balance.  Put kitty litter or salt on icy sidewalks  Walk on the grass if the sidewalks are slick  Avoid walking on uneven ground whenever possible  T prevent falls indoors:  Keep rooms clutter-free, especially hallways, stairs and paths to light switches  Remove throw rugs  Install night lights, especially to and in the bathroom  Turn on lights before going downstairs  Keep a flashlight next to your bed  Buy a cordless phone to keep with you instead of jumping up to answer the phone  Install grab bars in the bathroom near the shower and toilet  Install rails on both sides of the stairs.  Make sure the stairs are well lit  Wear slippers with non-skid soles.  Do not walk around in stockings or socks  Balance problems and dizziness are not a normal part of growing older.  If you begin having balance problems or dizziness see your doctor.  Physical Therapy can help you with many balance problems, strengthening hip and leg muscles and with gait training.  To keep your  bones healthy make sure you are getting enough calcium and Vitamin D each day.  Ask your doctor or pharmacist about supplements.  Regular weight-bearing exercise like walking, lifting weights or dancing can help strengthen bones and prevent osteoporosis.      Agustina Caroli, MD [facility]

## 2019-07-15 NOTE — Patient Instructions (Addendum)
If you have lab work done today you will be contacted with your lab results within the next 2 weeks.  If you have not heard from [name] then please contact us. The fastest way to get your results is to register for My Chart.   IF you received an x-ray today, you will receive an invoice from [facility]. Please contact [facility] at [phone] with questions or concerns regarding your invoice.   IF you received labwork today, you will receive an invoice from [place]. Please contact [facility] at [phone] with questions or concerns regarding your invoice.   Our billing staff will not be able to assist you with questions regarding bills from these companies.  You will be contacted with the lab results as soon as they are available. The fastest way to get your results is to activate your My Chart account. Instructions are located on the last page of this paperwork. If you have not heard from [name] regarding the results in 2 weeks, please contact this office.     Tissue Adhesive Wound Care Some cuts and wounds can be closed with skin glue (tissue adhesive). Skin glue holds the skin together and helps your wound heal faster. Skin glue goes away on its own as your wound gets better. Follow these instructions at home:  Wound care  Showers are allowed 24 hours after treatment. Do not soak the wound in water. Do not take baths, swim, or use hot tubs. Do not use soaps or creams on your wound.  If a bandage (dressing) was put on the wound: ? Wash your hands with soap and water before you change your bandage. ? Change the bandage as often as told by your doctor. ? Leave skin glue in place. It will fall off on its own after 7-10 days. ? Keep the bandage dry.  Do not scratch, rub, or pick at the skin glue.  Do not put tape over the skin glue. The skin glue could come off when you take the tape off.  Protect the wound from another injury.  Protect the wound from sun and  tanning beds. General instructions  Take over-the-counter and prescription medicines only as told by your doctor.  Keep all follow-up visits as told by your doctor. This is important. Get help right away if:  Your wound is red, puffy (swollen), hot, or tender.  You get a rash after the glue is put on.  You have more pain in the wound.  You have a red streak going away from the wound.  You have yellowish-white fluid (pus) coming from the wound.  You have more bleeding.  You have a fever.  You have chills and you start to shake.  You notice a bad smell coming from the wound.  Your wound or skin glue breaks open. This information is not intended to replace advice given to you by your health care provider. Make sure you discuss any questions you have with your health care provider. Document Revised: 11/30/2016 Document Reviewed: 11/11/2015 Elsevier Patient Education  2020 [place] and Fractures  Falls can be very serious, especially for older adults or people with osteoporosis  Falls can be caused by:  Tripping or slipping  Slow reflexes  Balance problems  Reduced muscle strength  Poor vision or a recent change in prescription  Illness and some medications (especially blood pressure pills, diuretics, heart medicines, muscle relaxants and sleep medications)  Drinking alcohol  To prevent falls outdoors:  Use a can or walker if needed  Wear rubber-soled shoes so you don't slip  DO NOT buy "shape up" shoes with rocker bottom soles if you have balance problems.  The thick soles and shape make it more difficult to keep your balance.  Put kitty litter or salt on icy sidewalks  Walk on the grass if the sidewalks are slick  Avoid walking on uneven ground whenever possible  T prevent falls indoors:  Keep rooms clutter-free, especially hallways, stairs and paths to light switches  Remove throw rugs  Install night lights, especially to and  in the bathroom  Turn on lights before going downstairs  Keep a flashlight next to your bed  Buy a cordless phone to keep with you instead of jumping up to answer the phone  Install grab bars in the bathroom near the shower and toilet  Install rails on both sides of the stairs.  Make sure the stairs are well lit  Wear slippers with non-skid soles.  Do not walk around in stockings or socks  Balance problems and dizziness are not a normal part of growing older.  If you begin having balance problems or dizziness see your doctor.  Physical Therapy can help you with many balance problems, strengthening hip and leg muscles and with gait training.  To keep your bones healthy make sure you are getting enough calcium and Vitamin D each day.  Ask your doctor or pharmacist about supplements.  Regular weight-bearing exercise like walking, lifting weights or dancing can help strengthen bones and prevent osteoporosis.

## 2019-07-16 ENCOUNTER — Other Ambulatory Visit: Payer: Self-pay | Admitting: Emergency Medicine

## 2019-07-20 ENCOUNTER — Ambulatory Visit (INDEPENDENT_AMBULATORY_CARE_PROVIDER_SITE_OTHER): Payer: BC Managed Care – PPO | Admitting: Emergency Medicine

## 2019-07-20 ENCOUNTER — Ambulatory Visit (INDEPENDENT_AMBULATORY_CARE_PROVIDER_SITE_OTHER): Payer: BC Managed Care – PPO

## 2019-07-20 ENCOUNTER — Other Ambulatory Visit: Payer: Self-pay

## 2019-07-20 ENCOUNTER — Encounter: Payer: Self-pay | Admitting: Emergency Medicine

## 2019-07-20 VITALS — BP 160/79 | HR 97 | Temp 98.1°F | Resp 16 | Ht 64.0 in | Wt 110.0 lb

## 2019-07-20 DIAGNOSIS — R634 Abnormal weight loss: Secondary | ICD-10-CM

## 2019-07-20 DIAGNOSIS — I10 Essential (primary) hypertension: Secondary | ICD-10-CM | POA: Diagnosis not present

## 2019-07-20 DIAGNOSIS — Z1211 Encounter for screening for malignant neoplasm of colon: Secondary | ICD-10-CM

## 2019-07-20 DIAGNOSIS — Z1231 Encounter for screening mammogram for malignant neoplasm of breast: Secondary | ICD-10-CM

## 2019-07-20 LAB — POCT CBC
Granulocyte percent: 77.2 %G (ref 37–80)
HCT, POC: 37.9 % (ref 29–41)
Hemoglobin: 13.2 g/dL (ref 11–14.6)
Lymph, poc: 1.2 (ref 0.6–3.4)
MCH, POC: 30.8 pg (ref 27–31.2)
MCHC: 32.8 g/dL (ref 31.8–35.4)
MCV: 93.7 fL (ref 76–111)
MID (cbc): 0.4 (ref 0–0.9)
MPV: 7.1 fL (ref 0–99.8)
POC Granulocyte: 5.2 (ref 2–6.9)
POC LYMPH PERCENT: 17.5 %L (ref 10–50)
POC MID %: 5.5 %M (ref 0–12)
Platelet Count, POC: 250 10*3/uL (ref 142–424)
RBC: 4.05 M/uL (ref 4.04–5.48)
RDW, POC: 13.2 %
WBC: 6.8 10*3/uL (ref 4.6–10.2)

## 2019-07-20 LAB — POCT GLYCOSYLATED HEMOGLOBIN (HGB A1C): Hemoglobin A1C: 5 % (ref 4.0–5.6)

## 2019-07-20 LAB — GLUCOSE, POCT (MANUAL RESULT ENTRY): POC Glucose: 94 mg/dl (ref 70–99)

## 2019-07-20 LAB — POCT URINALYSIS DIP (MANUAL ENTRY)
Bilirubin, UA: NEGATIVE
Blood, UA: NEGATIVE
Glucose, UA: NEGATIVE mg/dL
Ketones, POC UA: NEGATIVE mg/dL
Nitrite, UA: NEGATIVE
Protein Ur, POC: NEGATIVE mg/dL
Spec Grav, UA: 1.015 (ref 1.010–1.025)
Urobilinogen, UA: 0.2 E.U./dL
pH, UA: 6.5 (ref 5.0–8.0)

## 2019-07-20 LAB — IFOBT (OCCULT BLOOD): IFOBT: NEGATIVE

## 2019-07-20 IMAGING — DX DG CHEST 2V
2 series · 2 of 2 positions shown · non-contrast
Comparison: Chest CT [DATE]

CLINICAL DATA: Weight loss.

EXAM:
CHEST - 2 VIEW

[chest pa]
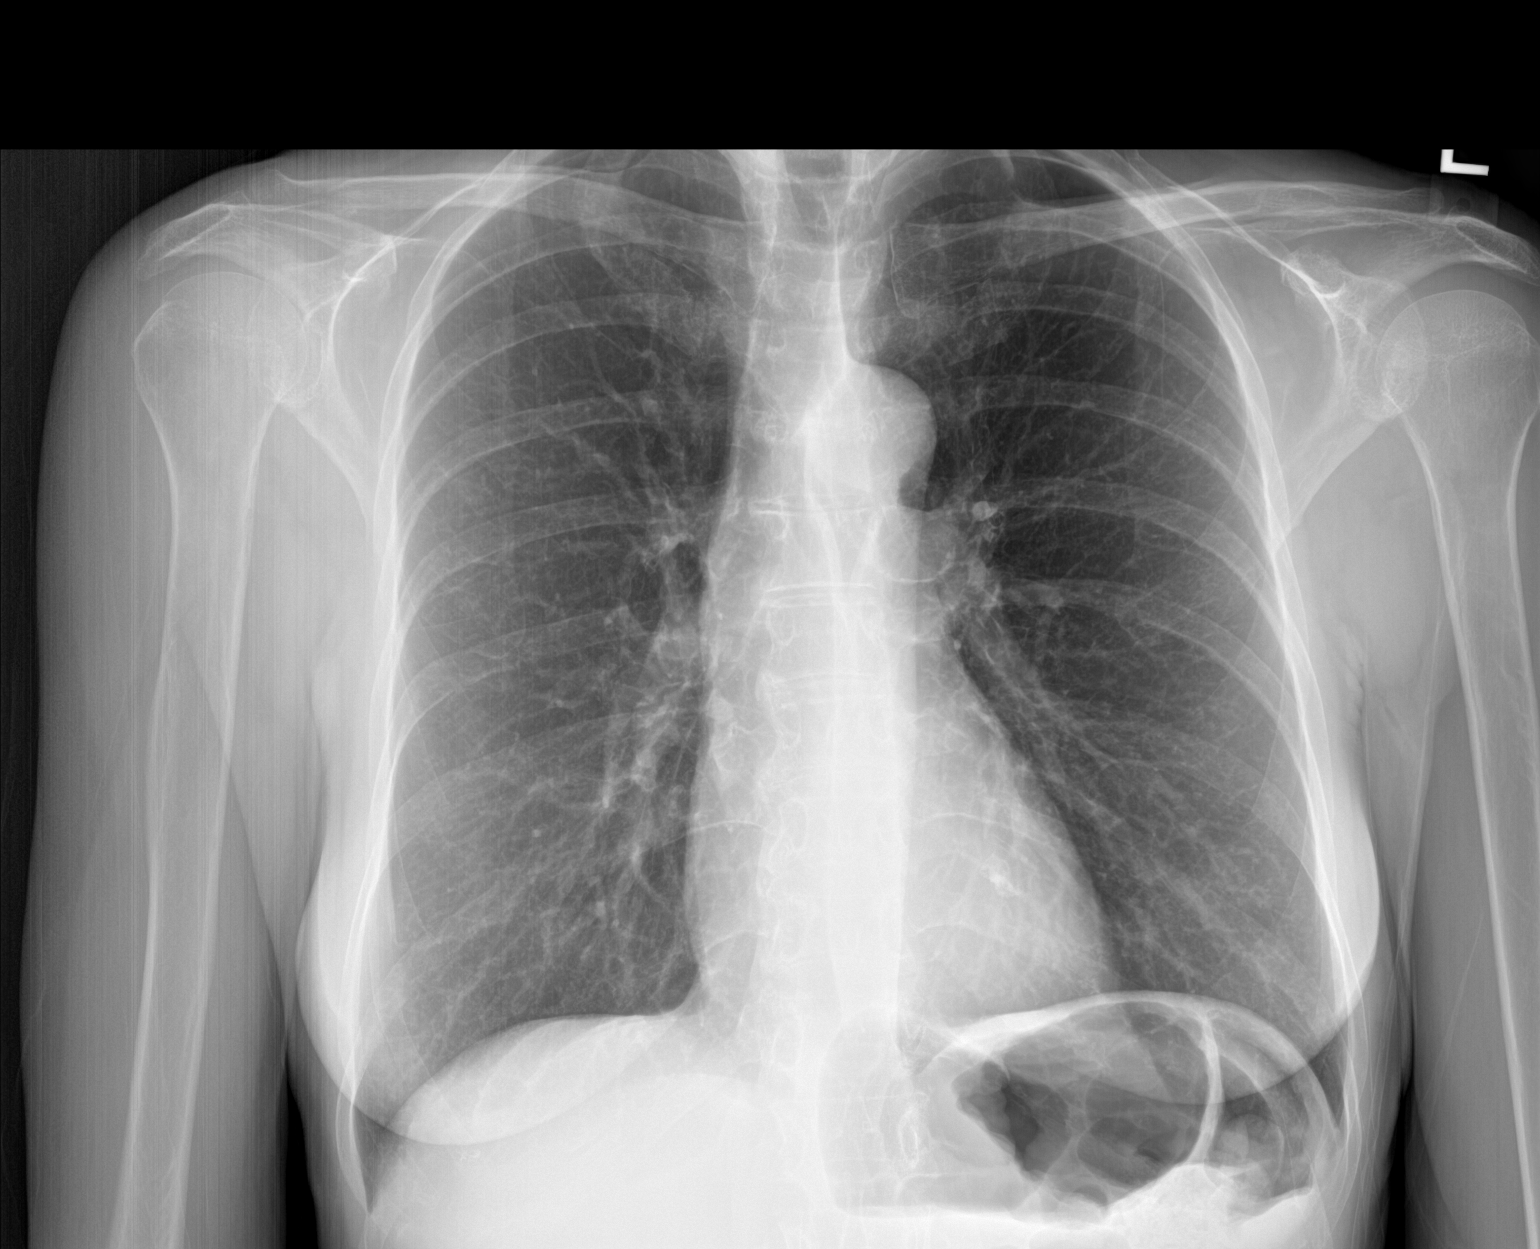

[chest lat]
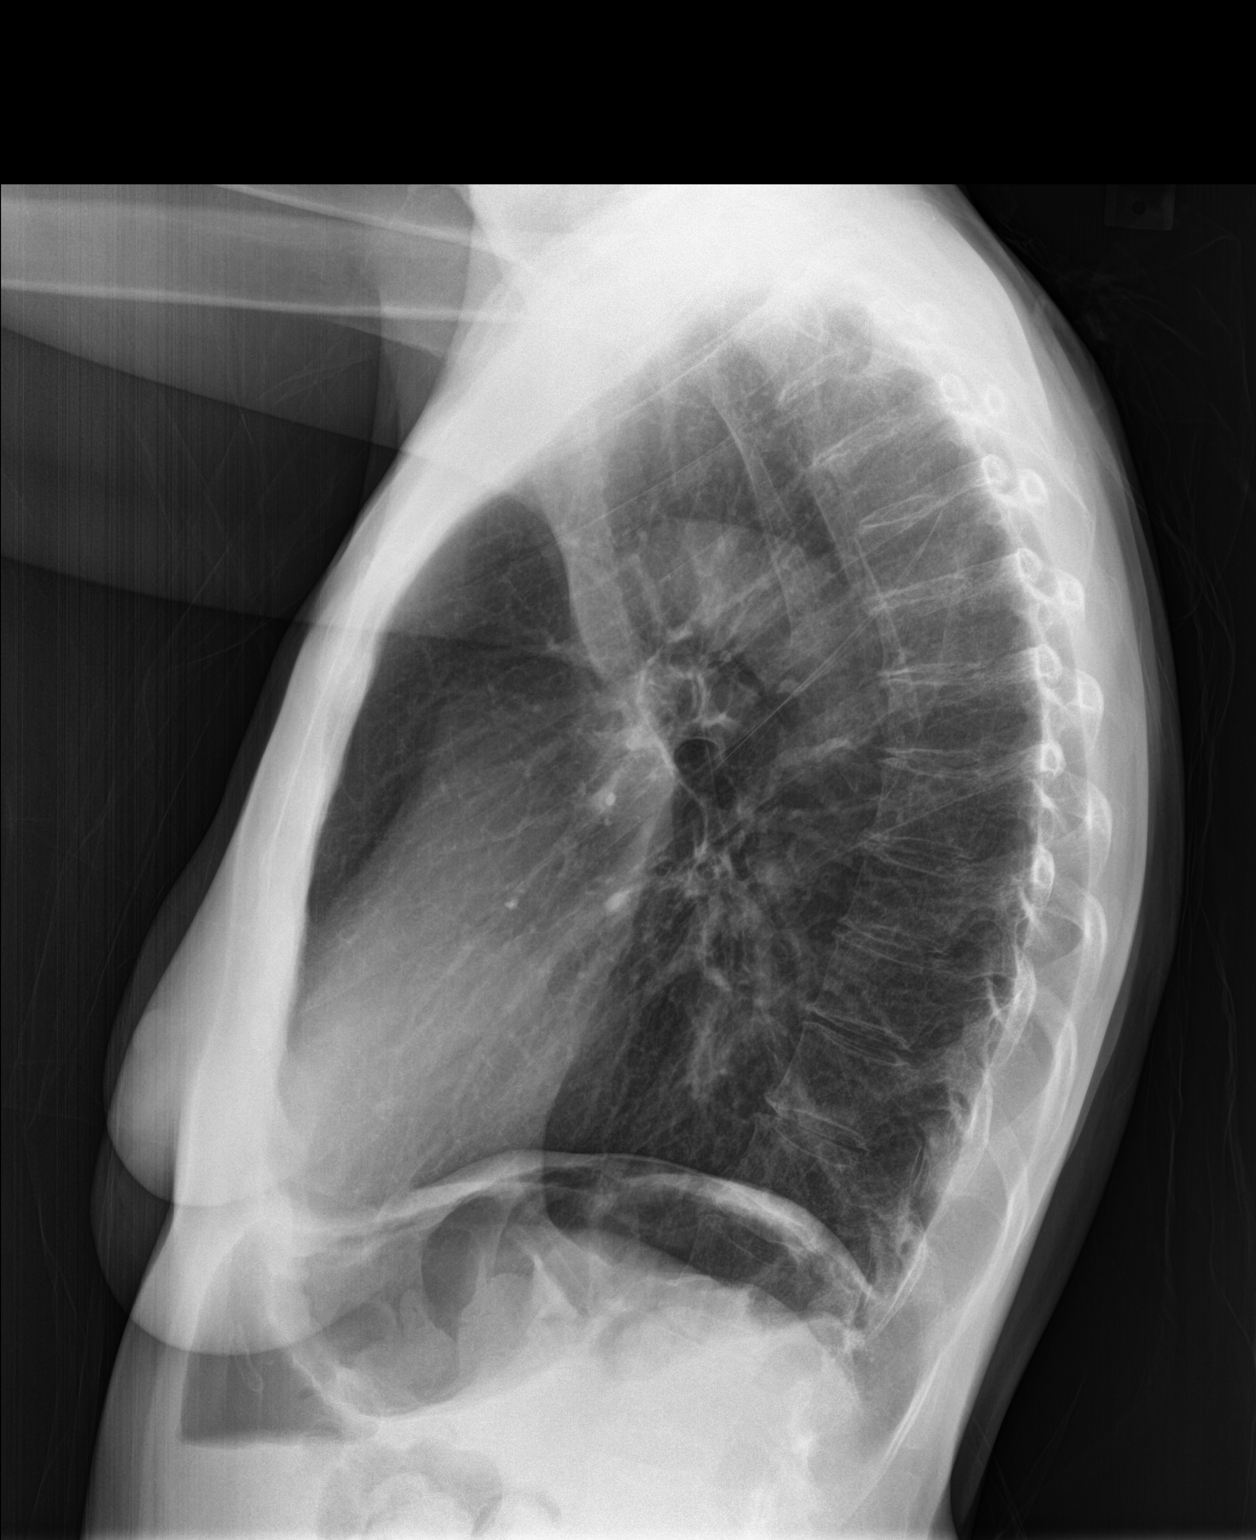

[2 of 2 positions shown; findings below may reference images not displayed]

FINDINGS: The cardiac silhouette, mediastinal and hilar contours are normal.
The lungs are clear. Mild hyperinflation. No pulmonary lesions. No
pleural effusion. The bony thorax is intact.
IMPRESSION: Hyperinflation but no acute pulmonary findings or worrisome
pulmonary lesions.

## 2019-07-20 MED ORDER — LOSARTAN POTASSIUM 25 MG PO TABS: 25.0000 mg | ORAL_TABLET | Freq: Every day | ORAL | 3 refills | Status: DC

## 2019-07-20 NOTE — Patient Instructions (Addendum)
   If you have lab work done today you will be contacted with your lab results within the next 2 weeks.  If you have not heard from us then please contact us. The fastest way to get your results is to register for My Chart.   IF you received an x-ray today, you will receive an invoice from [facility]. Please contact [facility] at [phone] with questions or concerns regarding your invoice.   IF you received labwork today, you will receive an invoice from [facility]. Please contact [facility] at [phone] with questions or concerns regarding your invoice.   Our billing staff will not be able to assist you with questions regarding bills from these companies.  You will be contacted with the lab results as soon as they are available. The fastest way to get your results is to activate your My Chart account. Instructions are located on the last page of this paperwork. If you have not heard from us regarding the results in 2 weeks, please contact this office.     Health Maintenance After Age 65 After age 65, you are at a higher risk for certain long-term diseases and infections as well as injuries from falls. Falls are a major cause of broken bones and head injuries in people who are older than age 65. Getting regular preventive care can help to keep you healthy and well. Preventive care includes getting regular testing and making lifestyle changes as recommended by your health care provider. Talk with your health care provider about:  Which screenings and tests you should have. A screening is a test that checks for a disease when you have no symptoms.  A diet and exercise plan that is right for you. What should I know about screenings and tests to prevent falls? Screening and testing are the best ways to find a health problem early. Early diagnosis and treatment give you the best chance of managing medical conditions that are common after age 65. Certain conditions and  lifestyle choices may make you more likely to have a fall. Your health care provider may recommend:  Regular vision checks. Poor vision and conditions such as cataracts can make you more likely to have a fall. If you wear glasses, make sure to get your prescription updated if your vision changes.  Medicine review. Work with your health care provider to regularly review all of the medicines you are taking, including over-the-counter medicines. Ask your health care provider about any side effects that may make you more likely to have a fall. Tell your health care provider if any medicines that you take make you feel dizzy or sleepy.  Osteoporosis screening. Osteoporosis is a condition that causes the bones to get weaker. This can make the bones weak and cause them to break more easily.  Blood pressure screening. Blood pressure changes and medicines to control blood pressure can make you feel dizzy.  Strength and balance checks. Your health care provider may recommend certain tests to check your strength and balance while standing, walking, or changing positions.  Foot health exam. Foot pain and numbness, as well as not wearing proper footwear, can make you more likely to have a fall.  Depression screening. You may be more likely to have a fall if you have a fear of falling, feel emotionally low, or feel unable to do activities that you used to do.  Alcohol use screening. Using too much alcohol can affect your balance and may make you more likely to   have a fall. What actions can I take to lower my risk of falls? General instructions  Talk with your health care provider about your risks for falling. Tell your health care provider if: ? You fall. Be sure to tell your health care provider about all falls, even ones that seem minor. ? You feel dizzy, sleepy, or off-balance.  Take over-the-counter and prescription medicines only as told by your health care provider. These include any  supplements.  Eat a healthy diet and maintain a healthy weight. A healthy diet includes low-fat dairy products, low-fat (lean) meats, and fiber from whole grains, beans, and lots of fruits and vegetables. Home safety  Remove any tripping hazards, such as rugs, cords, and clutter.  Install safety equipment such as grab bars in bathrooms and safety rails on stairs.  Keep rooms and walkways well-lit. Activity   Follow a regular exercise program to stay fit. This will help you maintain your balance. Ask your health care provider what types of exercise are appropriate for you.  If you need a cane or walker, use it as recommended by your health care provider.  Wear supportive shoes that have nonskid soles. Lifestyle  Do not drink alcohol if your health care provider tells you not to drink.  If you drink alcohol, limit how much you have: ? 0-1 drink a day for women. ? 0-2 drinks a day for men.  Be aware of how much alcohol is in your drink. In the [place] one drink equals one typical bottle of beer (12 oz), one-half glass of wine (5 oz), or one shot of hard liquor (1 oz).  Do not use any products that contain nicotine or tobacco, such as cigarettes and e-cigarettes. If you need help quitting, ask your health care provider. Summary  Having a healthy lifestyle and getting preventive care can help to protect your health and wellness after age 65.  Screening and testing are the best way to find a health problem early and help you avoid having a fall. Early diagnosis and treatment give you the best chance for managing medical conditions that are more common for people who are older than age 65.  Falls are a major cause of broken bones and head injuries in people who are older than age 65. Take precautions to prevent a fall at home.  Work with your health care provider to learn what changes you can make to improve your health and wellness and to prevent falls. This information is not intended  to replace advice given to you by your health care provider. Make sure you discuss any questions you have with your health care provider. Document Revised: 04/10/2018 Document Reviewed: 10/31/2016 Elsevier Patient Education  2020 [employer]  

## 2019-07-20 NOTE — Progress Notes (Signed)
Jacqueline Bonilla 64 y.o.   Chief Complaint  Patient presents with  . Weight Loss     follow up from 07/15/2019    HISTORY OF PRESENT ILLNESS: This is a 64 y.o. female here for follow-up of head injury sustained last week. Also here for evaluation of weight loss. Doing well.  Has no complaints or medical concerns today. Wt Readings from Last 3 Encounters:  07/20/19 110 lb (49.9 kg)  07/15/19 107 lb (48.5 kg)  06/11/19 113 lb (51.3 kg)   BP Readings from Last 3 Encounters:  07/20/19 (!) 160/79  07/15/19 118/75  06/11/19 120/76   Not up-to-date with mammogram or colonoscopy.  HPI   Prior to Admission medications   Medication Sig Start Date End Date Taking? Authorizing Provider  Cholecalciferol (VITAMIN D3 GUMMIES PO) Take 1,000 mcg by mouth daily.   Yes [provider]  Cyanocobalamin (VITAMIN B 12 PO) Take 1,000 mcg by mouth daily.   Yes [provider]  MAGNESIUM PO Take by mouth as needed.    Yes [provider]  MELATONIN PO Take 10 mg by mouth daily.   Yes [provider]  Multiple Vitamins-Minerals (AIRBORNE GUMMIES) CHEW Chew by mouth daily.   Yes [provider]  Multiple Vitamins-Minerals (HAIR SKIN AND NAILS FORMULA PO) Take 2,500 mcg by mouth daily.   Yes [provider]  OVER THE COUNTER MEDICATION daily.   Yes [provider]  Probiotic Product (PROBIOTIC DAILY PO) Take by mouth daily.   Yes [provider]  UNABLE TO FIND Med Name: "striction D" (?) for blood pressure    Yes [provider]  predniSONE (DELTASONE) 50 MG tablet Take 1 pill by mouth 13 hours, 7 hours  and 1 hour before CT for a total of 3 doses of prednisone. Take 50 mg of Benadryl by mouth with the prednisone 1 hour before CT for a total of one dose of benadryl. Patient not taking: Reported on 07/20/2019 06/25/19   Marcial Pacas, MD    Allergies  Allergen Reactions  . Avelox [Moxifloxacin]   . Ciprofloxacin Hives  .  Floraquin [Iodoquinol]     No cipro or others  . Iohexol      Code: HIVES, Desc: HIVES S/P IVP MANY YRS AGO...NO PROBLEM W/O PREP TODAY/A.[place], Onset Date: [id number]   . Levaquin [Levofloxacin]   . Quinolones   . Shellfish Allergy Itching    Itching under the chin. Described by patient but not seen by husband.    Patient Active Problem List   Diagnosis Date Noted  . Vascular dementia without behavioral disturbance ([place]) 07/15/2019  . Cerebrovascular accident ([place]) 06/25/2019  . Brain fog 04/22/2019  . History of COVID-19 04/22/2019  . Essential hypertension 03/03/2019  . Hypertensive urgency 03/03/2019    Past Medical History:  Diagnosis Date  . Anemia   . Anxiety   . Depression   . Dysmenorrhea   . Endometriosis   . Fibroid   . Hypertension   . Microhematuria    negative workup  . Osteoporosis   . Tachycardia   . Thrombocytopenia ([place])     Past Surgical History:  Procedure Laterality Date  . BREAST BIOPSY    . CESAREAN SECTION    . hysteroscopic resection      Social History   Socioeconomic History  . Marital status: Married    Spouse name: Not on file  . Number of children: 1  . Years of education: Not on file  .  Highest education level: Not on file  Occupational History  . Occupation: accounting  Tobacco Use  . Smoking status: Never Smoker  . Smokeless tobacco: Never Used  Vaping Use  . Vaping Use: Never used  Substance and Sexual Activity  . Alcohol use: Never  . Drug use: Never  . Sexual activity: Not Currently    Partners: Male    Comment: husband vasectomy  Other Topics Concern  . 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 Difficulty of Paying Living Expenses:   Food Insecurity:   . Worried About  Charity fundraiser in the Last Year:   . Arboriculturist in the Last Year:   Transportation Needs:   . Film/video editor (Medical):   Marland Kitchen Lack of Transportation (Non-Medical):   Physical Activity:   . Days of Exercise per Week:   . Minutes of Exercise per Session:   Stress:   . Feeling of Stress :   Social Connections:   . Frequency of Communication with Friends and Family:   . Frequency of Social Gatherings with Friends and Family:   . Attends Religious Services:   . Active Member of Clubs or Organizations:   . Attends Archivist Meetings:   Marland Kitchen Marital Status:   Intimate Partner Violence:   . Fear of Current or Ex-Partner:   . Emotionally Abused:   Marland Kitchen Physically Abused:   . Sexually Abused:     Family History  Problem Relation Age of Onset  . Cancer Father        non hodgkin lymphoma & skin  . Heart attack Maternal Grandfather   . Dementia Mother   . Polymyositis Sister      Review of Systems  Constitutional: Positive for weight loss. Negative for chills, fever and malaise/fatigue.  HENT: Negative.  Negative for congestion and sore throat.   Eyes: Negative.  Negative for blurred vision and double vision.  Respiratory: Negative.  Negative for cough and shortness of breath.   Cardiovascular: Negative.  Negative for chest pain and palpitations.  Gastrointestinal: Negative.  Negative for abdominal pain, blood in stool, constipation, diarrhea, melena, nausea and vomiting.  Genitourinary: Negative.  Negative for dysuria and hematuria.  Musculoskeletal: Negative.  Negative for back pain, myalgias and neck pain.  Skin: Negative.  Negative for rash.  Neurological: Negative.  Negative for dizziness and headaches.  Endo/Heme/Allergies: Negative.   All other systems reviewed and are negative.  Vitals:   07/20/19 0812  BP: (!) 160/79  Pulse: 97  Resp: 16  Temp: 98.1 F (36.7 C)  SpO2: 100%     Physical Exam Vitals reviewed. Exam conducted with a chaperone  present.  Constitutional:      Appearance: Normal appearance.  HENT:     Head: Normocephalic.     Mouth/Throat:     Mouth: Mucous membranes are moist.     Pharynx: Oropharynx is clear.  Eyes:     Extraocular Movements: Extraocular movements intact.     Conjunctiva/sclera: Conjunctivae normal.     Pupils: Pupils are equal, round, and reactive to light.  Neck:     Vascular:  No carotid bruit.  Cardiovascular:     Rate and Rhythm: Normal rate and regular rhythm.     Pulses: Normal pulses.     Heart sounds: Normal heart sounds.  Pulmonary:     Effort: Pulmonary effort is normal.     Breath sounds: Normal breath sounds.  Chest:     Chest wall: No mass.     Breasts:        Right: Normal.        Left: Normal.  Abdominal:     General: Bowel sounds are normal. There is no distension.     Palpations: Abdomen is soft. There is no mass.     Tenderness: There is no right CVA tenderness or left CVA tenderness.  Genitourinary:    Rectum: Normal. No mass.  Musculoskeletal:        General: Normal range of motion.     Cervical back: Normal range of motion and neck supple.     Right lower leg: No edema.     Left lower leg: No edema.     Comments: Lower extremities: Warm to touch.  No erythema or bruising.  Excellent distal peripheral pulses and capillary refill.  Lymphadenopathy:     Head:     Right side of head: No submandibular adenopathy.     Left side of head: No submandibular adenopathy.     Cervical: No cervical adenopathy.     Right cervical: No superficial or posterior cervical adenopathy.    Left cervical: No superficial or posterior cervical adenopathy.     Upper Body:     Right upper body: No supraclavicular or axillary adenopathy.     Left upper body: No supraclavicular or axillary adenopathy.     Lower Body: No right inguinal adenopathy. No left inguinal adenopathy.  Skin:    General: Skin is warm and dry.     Comments: Positive old bruising to right buttock area No  vasculitic lesions noted.  Neurological:     General: No focal deficit present.     Mental Status: She is alert and oriented to person, place, and time.  Psychiatric:        Mood and Affect: Mood normal.        Behavior: Behavior normal.    DG Chest 2 View  Result Date: 07/20/2019 CLINICAL DATA:  Weight loss. EXAM: CHEST - 2 VIEW COMPARISON:  Chest CT 08/16/2005 FINDINGS: The cardiac silhouette, mediastinal and hilar contours are normal. The lungs are clear. Mild hyperinflation. No pulmonary lesions. No pleural effusion. The bony thorax is intact. IMPRESSION: Hyperinflation but no acute pulmonary findings or worrisome pulmonary lesions. Electronically Signed   By: Marijo Sanes M.D.   On: 07/20/2019 08:45   Results for orders placed or performed in visit on 07/20/19 (from the past 24 hour(s))  IFOBT POC (occult bld, rslt in office)     Status: None   Collection Time: 07/20/19  8:43 AM  Result Value Ref Range   IFOBT Negative   POCT urinalysis dipstick     Status: Abnormal   Collection Time: 07/20/19  9:39 AM  Result Value Ref Range   Color, UA yellow yellow   Clarity, UA clear clear   Glucose, UA negative negative mg/dL   Bilirubin, UA negative negative   Ketones, POC UA negative negative mg/dL   Spec Grav, UA 1.015 1.010 - 1.025   Blood, UA negative negative   pH, UA 6.5 5.0 - 8.0   Protein Ur, POC negative  negative mg/dL   Urobilinogen, UA 0.2 0.2 or 1.0 E.U./dL   Nitrite, UA Negative Negative   Leukocytes, UA Small (1+) (A) Negative  POCT glucose (manual entry)     Status: Normal   Collection Time: 07/20/19  9:49 AM  Result Value Ref Range   POC Glucose 94 70 - 99 mg/dl  POCT CBC     Status: Normal   Collection Time: 07/20/19  9:50 AM  Result Value Ref Range   WBC 6.8 4.6 - 10.2 K/uL   Lymph, poc 1.2 0.6 - 3.4   POC LYMPH PERCENT 17.5 10 - 50 %L   MID (cbc) 0.4 0 - 0.9   POC MID % 5.5 0 - 12 %M   POC Granulocyte 5.2 2 - 6.9   Granulocyte percent 77.2 37 - 80 %G   RBC  4.05 4.04 - 5.48 M/uL   Hemoglobin 13.2 11 - 14.6 g/dL   HCT, POC 37.9 29 - 41 %   MCV 93.7 76 - 111 fL   MCH, POC 30.8 27 - 31.2 pg   MCHC 32.8 31.8 - 35.4 g/dL   RDW, POC 13.2 %   Platelet Count, POC 250 142 - 424 K/uL   MPV 7.1 0 - 99.8 fL  POCT glycosylated hemoglobin (Hb A1C)     Status: Abnormal   Collection Time: 07/20/19  9:50 AM  Result Value Ref Range   Hemoglobin A1C 5.0 4.0 - 5.6 %   HbA1c POC (<> result, manual entry)     HbA1c, POC (prediabetic range)     HbA1c, POC (controlled diabetic range)       ASSESSMENT & PLAN: Essential hypertension Elevated systolic blood pressure.  Had episodes at home of hypertension.  Will start losartan 25 mg daily.  Oreatha was seen today for weight loss.  Diagnoses and all orders for this visit:  Loss of weight -     POCT CBC -     POCT glucose (manual entry) -     POCT glycosylated hemoglobin (Hb A1C) -     POCT urinalysis dipstick -     IFOBT POC (occult bld, rslt in office); Future -     Comprehensive metabolic panel -     DG Chest 2 View -     Thyroid Panel With TSH -     CEA -     Sedimentation Rate -     Vitamin B12 -     VITAMIN D 25 Hydroxy (Vit-D Deficiency, Fractures) -     IFOBT POC (occult bld, rslt in office)  Essential hypertension -     losartan (COZAAR) 25 MG tablet; Take 1 tablet (25 mg total) by mouth daily.  Encounter for screening mammogram for malignant neoplasm of breast -     MM Digital Screening; Future  Colon cancer screening -     Ambulatory referral to Gastroenterology    Patient Instructions       If you have lab work done today you will be contacted with your lab results within the next 2 weeks.  If you have not heard from [name] then please contact us. The fastest way to get your results is to register for My Chart.   IF you received an x-ray today, you will receive an invoice from [facility]. Please contact [facility] at [phone] with questions or concerns  regarding your invoice.   IF you received labwork today, you will receive an invoice from [facility]. Please contact [facility] at [phone]  with questions or concerns regarding your invoice.   Our billing staff will not be able to assist you with questions regarding bills from these companies.  You will be contacted with the lab results as soon as they are available. The fastest way to get your results is to activate your My Chart account. Instructions are located on the last page of this paperwork. If you have not heard from [name] regarding the results in 2 weeks, please contact this office.     Health Maintenance After Age 27 After age 60, you are at a higher risk for certain long-term diseases and infections as well as injuries from falls. Falls are a major cause of broken bones and head injuries in people who are older than age 63. Getting regular preventive care can help to keep you healthy and well. Preventive care includes getting regular testing and making lifestyle changes as recommended by your health care provider. Talk with your health care provider about:  Which screenings and tests you should have. A screening is a test that checks for a disease when you have no symptoms.  A diet and exercise plan that is right for you. What should I know about screenings and tests to prevent falls? Screening and testing are the best ways to find a health problem early. Early diagnosis and treatment give you the best chance of managing medical conditions that are common after age 53. Certain conditions and lifestyle choices may make you more likely to have a fall. Your health care provider may recommend:  Regular vision checks. Poor vision and conditions such as cataracts can make you more likely to have a fall. If you wear glasses, make sure to get your prescription updated if your vision changes.  Medicine review. Work with your health care provider to regularly review all of the medicines you are  taking, including over-the-counter medicines. Ask your health care provider about any side effects that may make you more likely to have a fall. Tell your health care provider if any medicines that you take make you feel dizzy or sleepy.  Osteoporosis screening. Osteoporosis is a condition that causes the bones to get weaker. This can make the bones weak and cause them to break more easily.  Blood pressure screening. Blood pressure changes and medicines to control blood pressure can make you feel dizzy.  Strength and balance checks. Your health care provider may recommend certain tests to check your strength and balance while standing, walking, or changing positions.  Foot health exam. Foot pain and numbness, as well as not wearing proper footwear, can make you more likely to have a fall.  Depression screening. You may be more likely to have a fall if you have a fear of falling, feel emotionally low, or feel unable to do activities that you used to do.  Alcohol use screening. Using too much alcohol can affect your balance and may make you more likely to have a fall. What actions can I take to lower my risk of falls? General instructions  Talk with your health care provider about your risks for falling. Tell your health care provider if: ? You fall. Be sure to tell your health care provider about all falls, even ones that seem minor. ? You feel dizzy, sleepy, or off-balance.  Take over-the-counter and prescription medicines only as told by your health care provider. These include any supplements.  Eat a healthy diet and maintain a healthy weight. A healthy diet includes low-fat dairy products, low-fat (  lean) meats, and fiber from whole grains, beans, and lots of fruits and vegetables. Home safety  Remove any tripping hazards, such as rugs, cords, and clutter.  Install safety equipment such as grab bars in bathrooms and safety rails on stairs.  Keep rooms and walkways  well-lit. Activity   Follow a regular exercise program to stay fit. This will help you maintain your balance. Ask your health care provider what types of exercise are appropriate for you.  If you need a cane or walker, use it as recommended by your health care provider.  Wear supportive shoes that have nonskid soles. Lifestyle  Do not drink alcohol if your health care provider tells you not to drink.  If you drink alcohol, limit how much you have: ? 0-1 drink a day for women. ? 0-2 drinks a day for men.  Be aware of how much alcohol is in your drink. In the [place] one drink equals one typical bottle of beer (12 oz), one-half glass of wine (5 oz), or one shot of hard liquor (1 oz).  Do not use any products that contain nicotine or tobacco, such as cigarettes and e-cigarettes. If you need help quitting, ask your health care provider. Summary  Having a healthy lifestyle and getting preventive care can help to protect your health and wellness after age 35.  Screening and testing are the best way to find a health problem early and help you avoid having a fall. Early diagnosis and treatment give you the best chance for managing medical conditions that are more common for people who are older than age 76.  Falls are a major cause of broken bones and head injuries in people who are older than age 75. Take precautions to prevent a fall at home.  Work with your health care provider to learn what changes you can make to improve your health and wellness and to prevent falls. This information is not intended to replace advice given to you by your health care provider. Make sure you discuss any questions you have with your health care provider. Document Revised: 04/10/2018 Document Reviewed: 10/31/2016 Elsevier Patient Education  2020 Elsevier Inc.      Agustina Caroli, MD [facility]

## 2019-07-20 NOTE — Assessment & Plan Note (Signed)
Elevated systolic blood pressure.  Had episodes at home of hypertension.  Will start losartan 25 mg daily.

## 2019-07-21 ENCOUNTER — Encounter: Payer: Self-pay | Admitting: Gastroenterology

## 2019-07-21 LAB — CEA: CEA: 3.4 ng/mL (ref 0.0–4.7)

## 2019-07-21 LAB — COMPREHENSIVE METABOLIC PANEL
ALT: 20 IU/L (ref 0–32)
AST: 24 IU/L (ref 0–40)
Albumin/Globulin Ratio: 1.8 (ref 1.2–2.2)
Albumin: 4.2 g/dL (ref 3.8–4.8)
Alkaline Phosphatase: 64 IU/L (ref 48–121)
BUN/Creatinine Ratio: 22 (ref 12–28)
BUN: 20 mg/dL (ref 8–27)
Bilirubin Total: 0.4 mg/dL (ref 0.0–1.2)
CO2: 24 mmol/L (ref 20–29)
Calcium: 9.4 mg/dL (ref 8.7–10.3)
Chloride: 100 mmol/L (ref 96–106)
Creatinine, Ser: 0.89 mg/dL (ref 0.57–1.00)
GFR calc Af Amer: 78 mL/min/{1.73_m2} (ref >59)
GFR calc non Af Amer: 68 mL/min/{1.73_m2} (ref >59)
Globulin, Total: 2.4 g/dL (ref 1.5–4.5)
Glucose: 105 mg/dL — ABNORMAL HIGH (ref 65–99)
Potassium: 4.6 mmol/L (ref 3.5–5.2)
Sodium: 137 mmol/L (ref 134–144)
Total Protein: 6.6 g/dL (ref 6.0–8.5)

## 2019-07-21 LAB — VITAMIN B12: Vitamin B-12: 1929 pg/mL — ABNORMAL HIGH (ref 232–1245)

## 2019-07-21 LAB — THYROID PANEL WITH TSH
Free Thyroxine Index: 1.8 (ref 1.2–4.9)
T3 Uptake Ratio: 30 % (ref 24–39)
T4, Total: 6 ug/dL (ref 4.5–12.0)
TSH: 1.04 u[IU]/mL (ref 0.450–4.500)

## 2019-07-21 LAB — SEDIMENTATION RATE: Sed Rate: 7 mm/hr (ref 0–40)

## 2019-07-21 LAB — VITAMIN D 25 HYDROXY (VIT D DEFICIENCY, FRACTURES): Vit D, 25-Hydroxy: 72.4 ng/mL (ref 30.0–100.0)

## 2019-07-22 LAB — URINE CULTURE

## 2019-07-29 ENCOUNTER — Telehealth: Payer: Self-pay | Admitting: Emergency Medicine

## 2019-07-29 ENCOUNTER — Telehealth: Payer: Self-pay | Admitting: Neurology

## 2019-07-29 NOTE — Telephone Encounter (Signed)
Patients husband called and wanted to know if she was being referred to a memory specialist. He thought that was the plan but hasnt heard anything. He would like to speak to the nurse.

## 2019-07-29 NOTE — Telephone Encounter (Signed)
Spoke with pt's husband and advised the 8/3 appt with Dr. Darol Destine is for the formal memory testing. He verbalized appreciation and his questions were answered.

## 2019-07-29 NOTE — Telephone Encounter (Signed)
Pt husband dropped off forms for handicap place card. Gave forms to [employer]. Please advise.

## 2019-07-29 NOTE — Telephone Encounter (Signed)
I received the applications for disability placard and completed some of the areas. When the provider return he will complete and sign, patient will be call to pick up applications.

## 2019-08-04 ENCOUNTER — Encounter: Payer: BC Managed Care – PPO | Attending: Psychology | Admitting: Psychology

## 2019-08-04 ENCOUNTER — Other Ambulatory Visit: Payer: Self-pay

## 2019-08-04 DIAGNOSIS — F0151 Vascular dementia with behavioral disturbance: Secondary | ICD-10-CM | POA: Diagnosis not present

## 2019-08-04 DIAGNOSIS — F0391 Unspecified dementia with behavioral disturbance: Secondary | ICD-10-CM

## 2019-08-10 ENCOUNTER — Encounter: Payer: Self-pay | Admitting: Psychology

## 2019-08-10 NOTE — Progress Notes (Signed)
NEUROBEHAVIORAL STATUS EXAM   Name: Jacqueline Bonilla Date of Birth: [birth year] Date of Interview: 08/04/2019  Reason for Referral:  Jacqueline Bonilla is a 64 year old, right-handed, Caucasian female who was referred for neuropsychological assessment by [name] from [facility] to evaluate her present level of cognitive functioning and aid in differential diagnosis and treatment planning. This patient is accompanied in the office by her husband who supplements the history.  BACKGROUND AND HISTORY  History of Presenting Complaints Jacqueline Bonilla interviewed in the presence of her husband, who also served as an Musician.  She presents with personality changes and psychotic features (e.g., delusions) along with gradual cognitive decline the past 3 1/2 years.  She admitted to having recent falls (approx. 3 times in lasts 6 months) and hitting her head on one occasion; she denied LOC. She described trouble   with pronunciation and notable word finding difficulties; she responds using short sentences to compensate for this change. She admitted to problems staying on topic as well as having "selective hearing". She mentioned problems reading musical notation but denied problems reading written material. She also described problems using her right hand that interferes with dressing on occasion.     She acknowledged having some trouble recalling recent information but did not appear concerned about her long-term memory; she self-rated her memory function as 90/100. She admitted to occasionally misplacing things, but denied any problems with remembering names, conversations, forgetting the purpose for going into a room, recognizing faces, navigation, getting lost, driving, and following directions. She admitted to "slowing down" overall and noted reduced balance and coordination.    The patient's husband dates the onset of personality change approximately 3  years ago and revealed that she became  severely paranoid at work and developed the belief that her co-workers were conspiring against her. He recalled gradually noticing other cognitive changes including ataxia, difficulty recalling previously learned information, motor weakness, and significant weight loss; she has recently gained 5 lbs. with improved diet and regular exercise. She is currently engaged in physical activity (e.g., aquatic fitness and free weights) around 5-6 days per week with positive effect noted.    The patient and her husband both agree that she has always been a happy and positive person with generally stable mood.  She sleeps 7-8 hours a night and denies any neurovegetative signs, but she has involuntarily lost around 10-15 lbs. in the last 9-10 months.  Her husband has observed decreased initiation and tendency to withdraw from social interaction.    Despite her proclivity toward social isolation, her husband notes that if she attends a social event, she is mostly pleasant, engaging, and socially appropriate.  The patient has residual pain in her hands and elbows from recent falls but she did not endorse other physical symptoms.  Psychiatric History and Substance Use The patient denied any history of psychiatric symptoms, diagnosis, or treatment.  She denied any alcohol, tobacco, or recreational drug abuse or dependence.    Medications  Losartan (25mg )  Psychosocial History The patient was born and raised in [place], [place] and she speaks only [place].  She reported normal developmental milestones.  She denied any history of intellectual/learning disability including ADHD. She completed her undergraduate studies in education but worked mostly in Press [employer] and was a Barista for around 20 years. She retired around 3  years ago after developing psychotic symptoms that interfered with her work duties and interactions with co-workers.   She has been married for 43 years  and has one adult child.    Family History The patient's father is deceased (circa 46) and her mother is alive. Her mother was a homemaker; she was recently placed in an assisted living facility for memory concerns.   Her father was a Holiday representative with an unremarkable medical history. She had one sibling who died in March 28, 2014 from polymyositis.  Present Living Situation and Daily Routine The patient resides with her husband.  Current activities include exercising (e.g., aquatic fitness and free weights) around 5-6 days per week.   Medical History: Past Medical History:  Diagnosis Date  . Anemia   . Anxiety   . Depression   . Dysmenorrhea   . Endometriosis   . Fibroid   . Hypertension   . Microhematuria    negative workup  . Osteoporosis   . Tachycardia   . Thrombocytopenia ([place])    Current Medications:  Outpatient Encounter Medications as of 08/04/2019  Medication Sig  . Cholecalciferol (VITAMIN D3 GUMMIES PO) Take 1,000 mcg by mouth daily.  . Cyanocobalamin (VITAMIN B 12 PO) Take 1,000 mcg by mouth daily.  Marland Kitchen losartan (COZAAR) 25 MG tablet Take 1 tablet (25 mg total) by mouth daily.  Marland Kitchen MAGNESIUM PO Take by mouth as needed.   Marland Kitchen MELATONIN PO Take 10 mg by mouth daily.  . Multiple Vitamins-Minerals (AIRBORNE GUMMIES) CHEW Chew by mouth daily.  . Multiple Vitamins-Minerals (HAIR SKIN AND NAILS FORMULA PO) Take 2,500 mcg by mouth daily.  Marland Kitchen OVER THE COUNTER MEDICATION daily.  . predniSONE (DELTASONE) 50 MG tablet Take 1 pill by mouth 13 hours, 7 hours  and 1 hour before CT for a total of 3 doses of prednisone. Take 50 mg of Benadryl by mouth with the prednisone 1 hour before CT for a total of one dose of benadryl. (Patient not taking: Reported on 07/20/2019)  . Probiotic Product (PROBIOTIC DAILY PO) Take by mouth daily.  Marland Kitchen UNABLE TO FIND Med Name: "striction D" (?) for blood pressure    No facility-administered encounter medications on file as of 08/04/2019.   BEHAVIORAL OBSERVATIONS Jacqueline Bonilla arrived at  the scheduled appointment on time and was accompanied by her husband. She was appropriately attired and well groomed.  Vision was corrected by glasses and hearing was adequate for testing purposes.  She appeared fidgety and her gait was unsteady. She tended to minimize her difficulties and symptoms.  During the clinical interview, the patient's husband would wait for her to respond to questions before offering his observations.  The patient mostly agreed with him but disagreed with the unreality or delusional quality of her past suspicions at work.   The patient was alert and partially oriented (e.g., incorrect year).  Her mood was somewhat elevated and expansive; she was pleasant and affable. She made good eye contact and smiled throughout the session.  Speech characteristics were notable for word finding difficulties and some dysarthria.  She openly volunteered information, but she had difficulty remembering dates and her child's age, looking to her husband for confirmation.  Thought processes were generally linear, organized and relevant.  Insight and judgment appeared limited as she tended to underreport the extent of her cognitive changes.    Suicidal Ideation/Intention: Denied   60 minutes spent face-to-face with patient completing neurobehavioral status exam. 60 minutes spent integrating medical records/clinical data and completing this report. [id number] unit; [id number].  TESTING: There is medical necessity to proceed with neuropsychological assessment as the results will be used to aid in differential diagnosis  and clinical decision-making and to inform specific treatment recommendations. Per the patient, her husband, and medical records reviewed, there has been a change in personality and cognitive functioning and a reasonable suspicion of dementia.  Clinical Decision Making: In considering the patient's current level of functioning, level of presumed impairment, nature of symptoms, emotional and  behavioral responses during the interview, level of literacy, and observed level of motivation, a battery of tests was selected and will be administered to patient by this provider.   PLAN: The patient will return to complete the above referenced full battery of neuropsychological testing with this provider. Education regarding testing procedures was provided to the patient and her husband. Subsequently, the patient will see this provider for a follow-up session at which time her test performances and my impressions and treatment recommendations will be reviewed in detail.   Evaluation ongoing; full report to follow.

## 2019-08-18 ENCOUNTER — Ambulatory Visit
Admission: RE | Admit: 2019-08-18 | Discharge: 2019-08-18 | Disposition: A | Discharge status: Home / Self Care | Payer: BC Managed Care – PPO | Source: Ambulatory Visit | Attending: Emergency Medicine | Admitting: Emergency Medicine

## 2019-08-18 ENCOUNTER — Other Ambulatory Visit: Payer: Self-pay

## 2019-08-18 DIAGNOSIS — Z1231 Encounter for screening mammogram for malignant neoplasm of breast: Secondary | ICD-10-CM | POA: Diagnosis not present

## 2019-08-18 IMAGING — MG DIGITAL SCREENING BILAT W/ CAD
4 series · 4 of 4 positions shown · non-contrast
Comparison: Previous exam(s).

CLINICAL DATA: Screening.

EXAM:
DIGITAL SCREENING BILATERAL MAMMOGRAM WITH CAD

[R MLO]
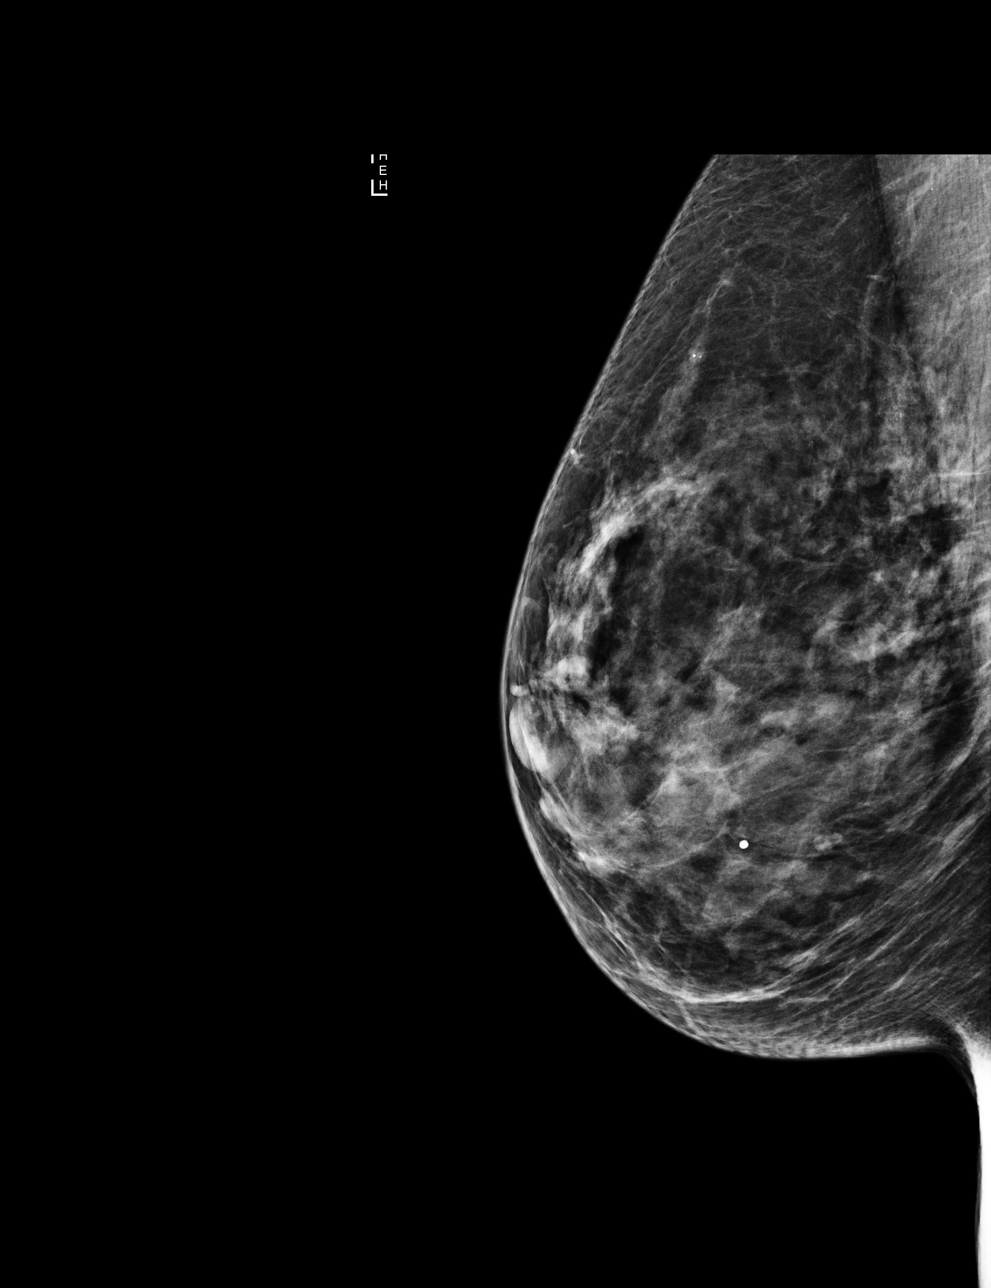

[R CC]
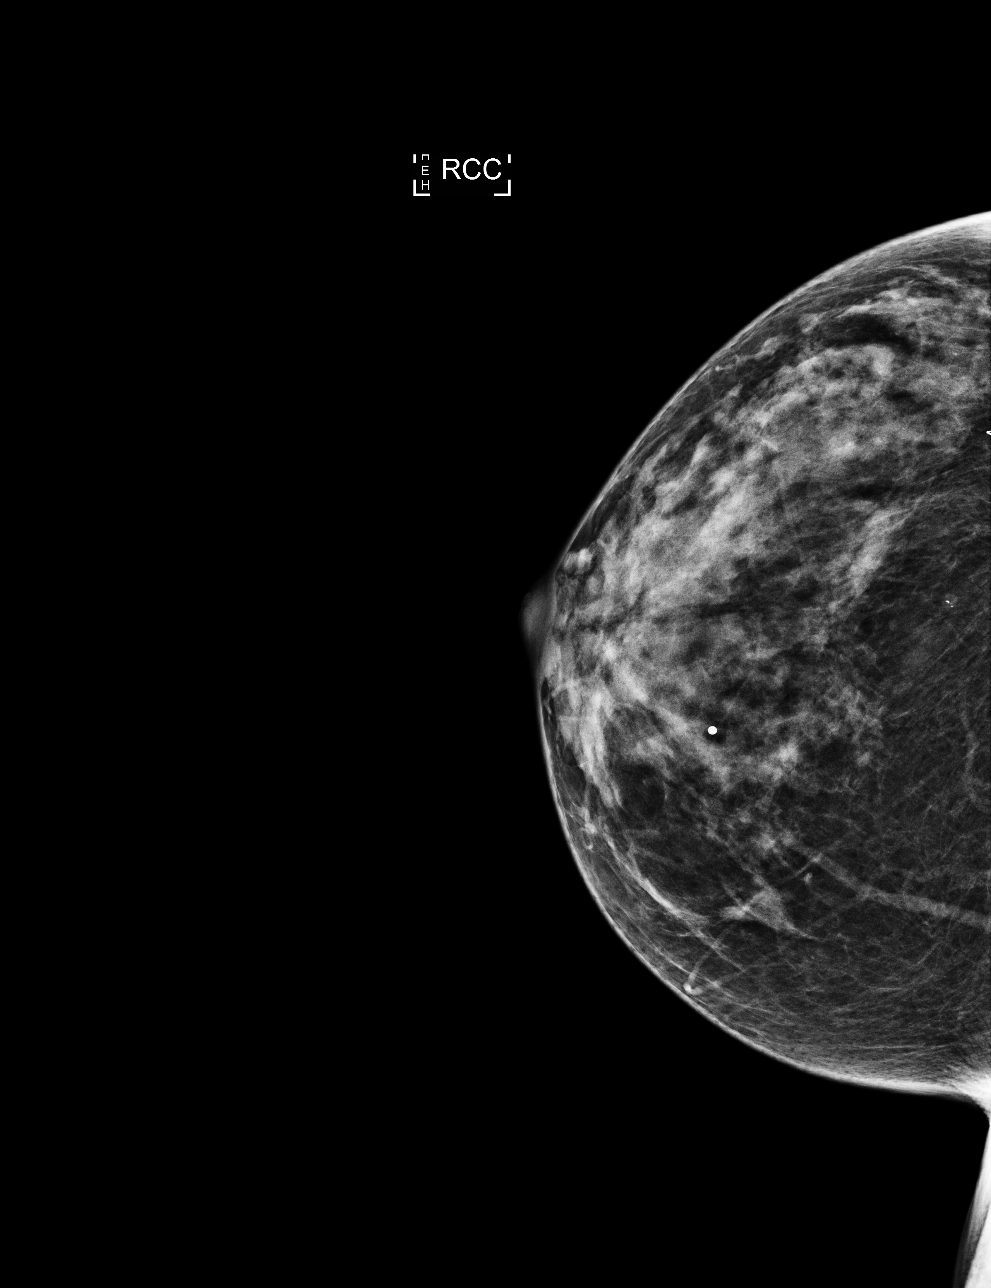

[L CC]
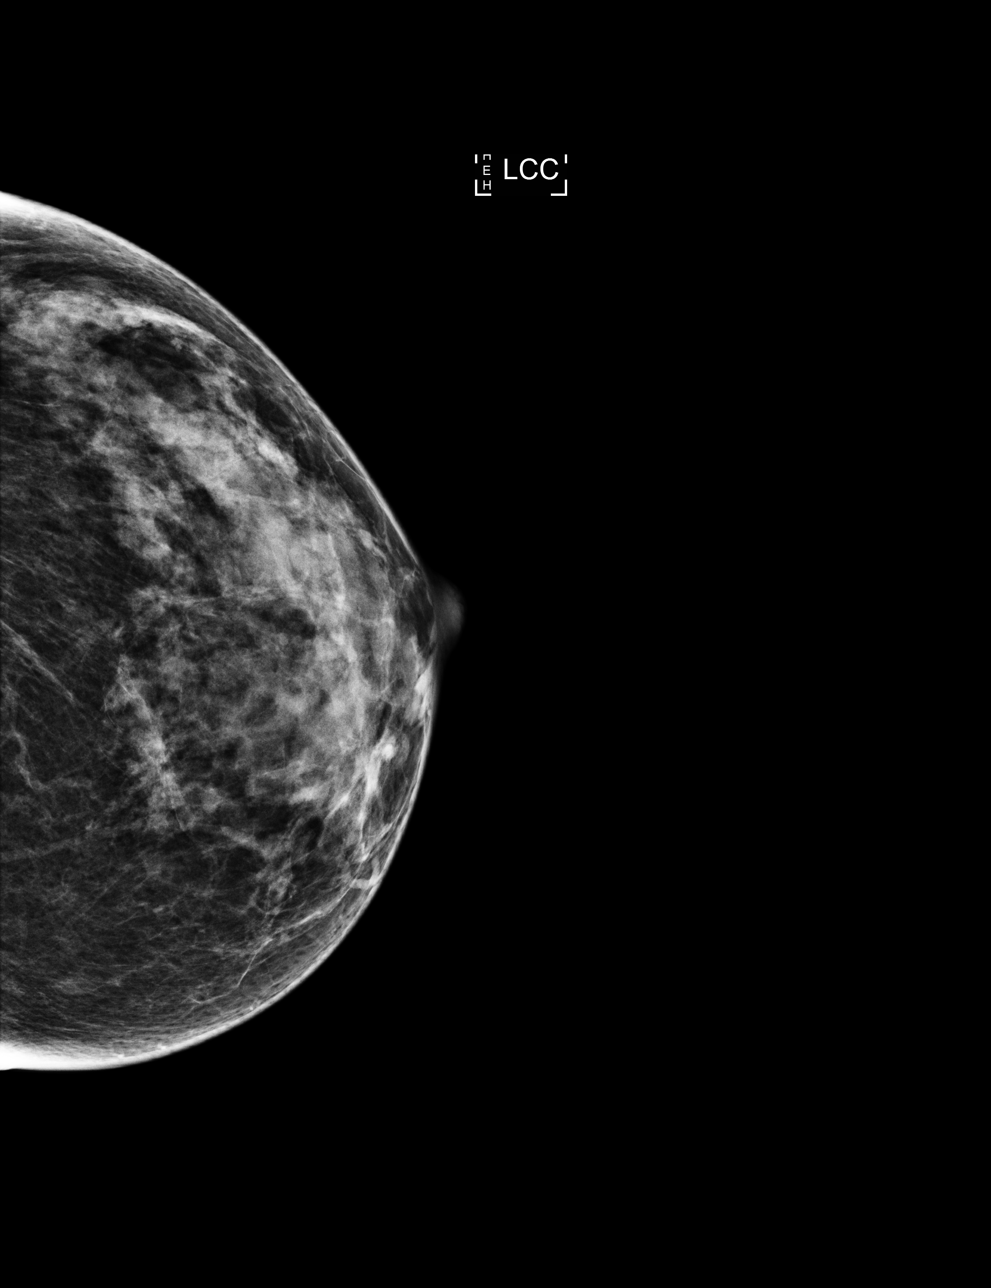

[L MLO]
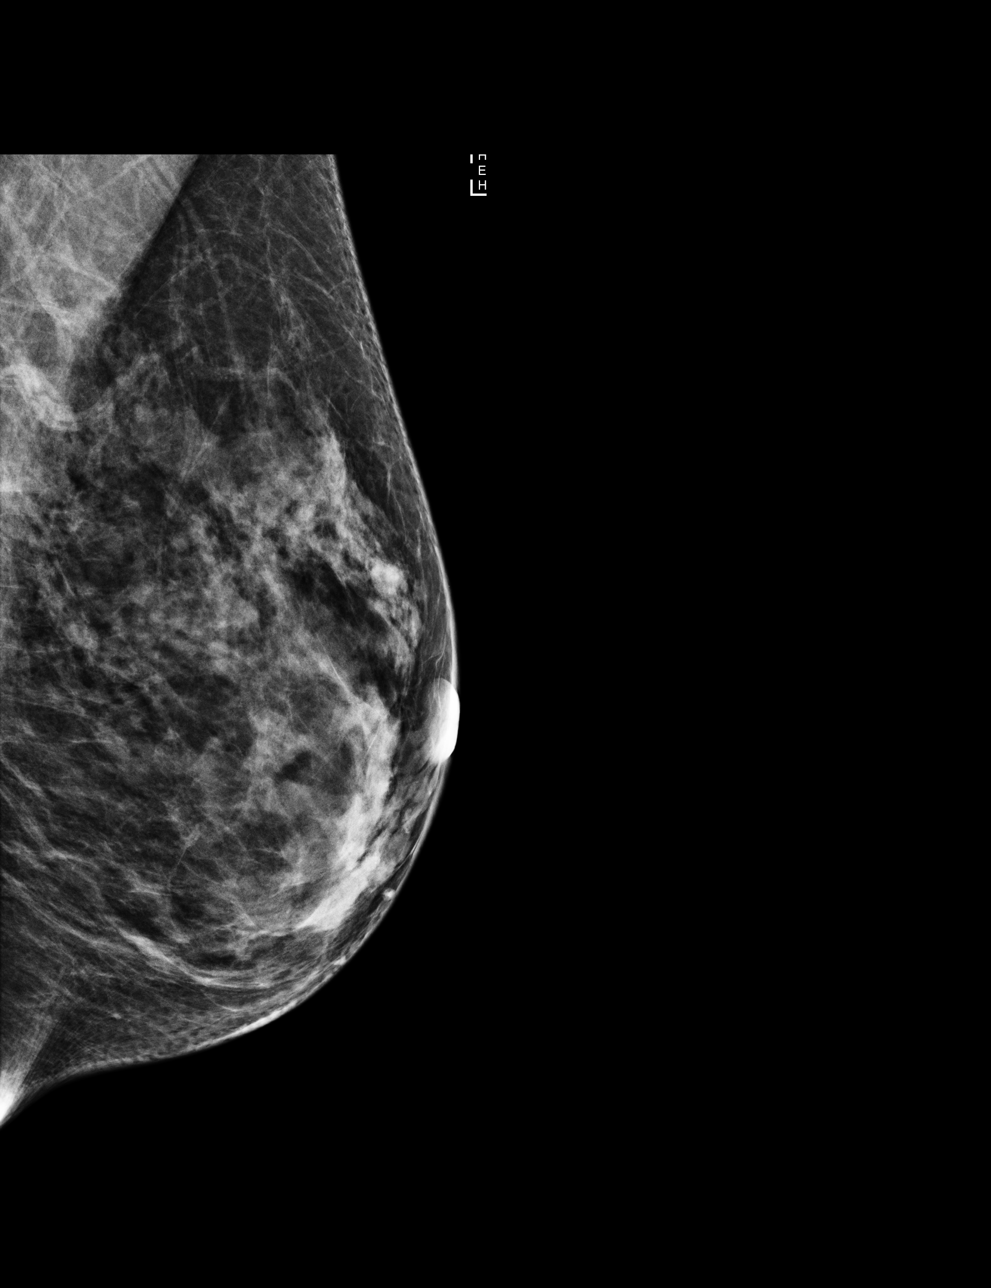

[4 of 4 positions shown; findings below may reference images not displayed]

ACR Breast Density Category c: The breast tissue is heterogeneously
dense, which may obscure small masses.
FINDINGS: There are no findings suspicious for malignancy. Images were
processed with CAD.
IMPRESSION: No mammographic evidence of malignancy. A result letter of this
screening mammogram will be mailed directly to the patient.

RECOMMENDATION:
Screening mammogram in one year. (Code:[0J])

BI-RADS CATEGORY  1: Negative.

## 2019-08-20 ENCOUNTER — Encounter: Payer: Self-pay | Admitting: Internal Medicine

## 2019-08-20 ENCOUNTER — Other Ambulatory Visit: Payer: Self-pay

## 2019-08-20 ENCOUNTER — Ambulatory Visit: Payer: BC Managed Care – PPO | Admitting: Internal Medicine

## 2019-08-20 ENCOUNTER — Telehealth: Payer: Self-pay | Admitting: Emergency Medicine

## 2019-08-20 ENCOUNTER — Telehealth: Payer: Self-pay | Admitting: Radiology

## 2019-08-20 VITALS — BP 122/62 | HR 93 | Ht 64.0 in | Wt 113.6 lb

## 2019-08-20 DIAGNOSIS — I639 Cerebral infarction, unspecified: Secondary | ICD-10-CM

## 2019-08-20 DIAGNOSIS — G464 Cerebellar stroke syndrome: Secondary | ICD-10-CM | POA: Diagnosis not present

## 2019-08-20 DIAGNOSIS — I6349 Cerebral infarction due to embolism of other cerebral artery: Secondary | ICD-10-CM

## 2019-08-20 NOTE — Telephone Encounter (Signed)
Spoke with Pt husband (on HIPPA) informed him that she was briefly prescribed 50 mg Losartan on 03/03/2019 and discontinued 03/11/2019 due to an error and re prescribed the 25 mg

## 2019-08-20 NOTE — Telephone Encounter (Signed)
Patients husband wants to know the day when the 50 mg of Losartan was prescribed / I can only see 25 mg on 07/20/2019        Please reach out to husband with this information

## 2019-08-20 NOTE — Progress Notes (Signed)
Electrophysiology Office Note   Date:  08/20/2019   ID:  Jacqueline, Bonilla 1955-12-06, MRN [id number]  PCP:  Horald Pollen, MD    Primary Electrophysiologist: Thompson Grayer, MD    CC: memory issues   History of Present Illness: Jacqueline Bonilla is a 64 y.o. female who presents today for electrophysiology evaluation.   She is referred by Dr Jaynee Eagles for further evaluation of arrhythmia as a possible concern for stroke.  She presented to see Dr Jaynee Eagles previously for evaluation of memory issues.  She had an MRI which revealed bilateral acute and subacute strokes which were felt to be embolic.  There was concern for afib as a possible cause.  Today, she denies symptoms of palpitations, chest pain, shortness of breath, orthopnea, PND, lower extremity edema, claudication, dizziness, presyncope, syncope, bleeding, or neurologic sequela. The patient is tolerating medications without difficulties and is otherwise without complaint today.    Past Medical History:  Diagnosis Date  . Anemia   . Anxiety   . Depression   . Dysmenorrhea   . Endometriosis   . Fibroid   . Hypertension   . Microhematuria    negative workup  . Osteoporosis   . Tachycardia   . Thrombocytopenia ([place])    Past Surgical History:  Procedure Laterality Date  . BREAST BIOPSY    . CESAREAN SECTION    . hysteroscopic resection       Current Outpatient Medications  Medication Sig Dispense Refill  . Cholecalciferol (VITAMIN D3 GUMMIES PO) Take 1,000 mcg by mouth daily.    Marland Kitchen losartan (COZAAR) 25 MG tablet Take 1 tablet (25 mg total) by mouth daily. 90 tablet 3  . MAGNESIUM PO Take by mouth as needed.     Marland Kitchen MELATONIN PO Take 10 mg by mouth daily.    . Multiple Vitamins-Minerals (AIRBORNE GUMMIES) CHEW Chew by mouth daily.    . Multiple Vitamins-Minerals (HAIR SKIN AND NAILS FORMULA PO) Take 2,500 mcg by mouth daily.    Marland Kitchen OVER THE COUNTER MEDICATION daily.    . Probiotic Product (PROBIOTIC DAILY PO)  Take by mouth daily.    Marland Kitchen UNABLE TO FIND Med Name: "striction D" (?) for blood pressure      No current facility-administered medications for this visit.    Allergies:   Avelox [moxifloxacin], Ciprofloxacin, Floraquin [iodoquinol], Iohexol, Levaquin [levofloxacin], Quinolones, and Shellfish allergy   Social History:  The patient  reports that she has never smoked. She has never used smokeless tobacco. She reports that she does not drink alcohol and does not use drugs.   Family History:  The patient's  family history includes Cancer in her father; Dementia in her mother; Heart attack in her maternal grandfather; Polymyositis in her sister.    ROS:  Please see the history of present illness.   All other systems are personally reviewed and negative.    PHYSICAL EXAM: VS:  BP 122/62   Pulse 93   Ht 5\' 4"  (1.626 m)   Wt 113 lb 9.6 oz (51.5 kg)   LMP 01/02/2003   SpO2 99%   BMI 19.50 kg/m  , BMI Body mass index is 19.5 kg/m. GEN: Well nourished, well developed, in no acute distress HEENT: normal Neck: no JVD, carotid bruits,   Cardiac: RRR Respiratory: normal work of breathing GI: soft  MS: no deformity or atrophy Skin: warm and dry  Neuro:  Strength and sensation are intact Psych: euthymic mood, full affect  EKG:  EKG  is ordered today. The ekg ordered today is personally reviewed and shows sinus   Recent Labs: 06/11/2019: Platelets 160 07/20/2019: ALT 20; BUN 20; Creatinine, Ser 0.89; Hemoglobin 13.2; Potassium 4.6; Sodium 137; TSH 1.040  personally reviewed   Lipid Panel     Component Value Date/Time   CHOL 173 06/11/2019 1431   TRIG 101 06/11/2019 1431   HDL 62 06/11/2019 1431   CHOLHDL 2.8 06/11/2019 1431   CHOLHDL 3.3 08/04/2013 1620   VLDL 16 08/04/2013 1620   LDLCALC 93 06/11/2019 1431   personally reviewed   Wt Readings from Last 3 Encounters:  08/20/19 113 lb 9.6 oz (51.5 kg)  07/20/19 110 lb (49.9 kg)  07/15/19 107 lb (48.5 kg)      Other studies  personally reviewed: Additional studies/ records that were reviewed today include: Dr Ferdinand Lango notes,  Recent MRI, all ekgs in epic Review of the above records today demonstrates: as above  Lipid panel 06/11/19 reviewed  Assessment and Plan:  1. Cryptogenic stroke The patient presents with cryptogenic stroke.  The patient has an echo ordered.  I spoke at length with the patient about monitoring for afib with either a 30 day event monitor or an implantable loop recorder.  She and her husband are clear that they would prefer a 30 day monitor at this time.  If no AF is found then they would consider ILR implant afterwards. Echo with bubble study is also ordered to further evaluate for possible cardiac source for her embolic strokes.  2. HTN Stable No change required today   Follow-up:  Return to see me in 2 months  Current medicines are reviewed at length with the patient today.   The patient does not have concerns regarding her medicines.  The following changes were made today:  none   Signed, Thompson Grayer, MD  08/20/2019 9:29 AM     [facility] [address] [place] [address] [phone] (office) [phone] (fax)

## 2019-08-20 NOTE — Patient Instructions (Signed)
Medication Instructions:  *If you need a refill on your cardiac medications before your next appointment, please call your pharmacy*  Lab Work: If you have labs (blood work) drawn today and your tests are completely normal, you will receive your results only by: Marland Kitchen MyChart Message (if you have MyChart) OR . A paper copy in the mail If you have any lab test that is abnormal or we need to change your treatment, we will call you to review the results.   Testing/Procedures: Your physician has requested that you have an echocardiogram with Bubble Study. Echocardiography is a painless test that uses sound waves to create images of your heart. It provides your doctor with information about the size and shape of your heart and how well your heart's chambers and valves are working. This procedure takes approximately one hour. There are no restrictions for this procedure.  Follow-Up: At [facility], you and your health needs are our priority.  As part of our continuing mission to provide you with exceptional heart care, we have created designated Provider Care Teams.  These Care Teams include your primary Cardiologist (physician) and Advanced Practice Providers (APPs -  Physician Assistants and Nurse Practitioners) who all work together to provide you with the care you need, when you need it.  We recommend signing up for the patient portal called "MyChart".  Sign up information is provided on this After Visit Summary.  MyChart is used to connect with patients for Virtual Visits (Telemedicine).  Patients are able to view lab/test results, encounter notes, upcoming appointments, etc.  Non-urgent messages can be sent to your provider as well.   To learn more about what you can do with MyChart, go to [email or web address].    Your next appointment:   Your physician recommends that you schedule a follow-up appointment in: 32 WEEKS with Dr. Rayann Heman   The format for your next appointment:   In Person with  Thompson Grayer, MD

## 2019-08-20 NOTE — Telephone Encounter (Signed)
Enrolled patient for a 30 day [employer] Event monitor to be mailed to patients home.  

## 2019-08-21 ENCOUNTER — Telehealth: Payer: Self-pay

## 2019-08-21 ENCOUNTER — Other Ambulatory Visit: Payer: Self-pay

## 2019-08-21 ENCOUNTER — Encounter: Payer: BC Managed Care – PPO | Admitting: Psychology

## 2019-08-21 ENCOUNTER — Encounter: Payer: Self-pay | Admitting: Psychology

## 2019-08-21 DIAGNOSIS — F0391 Unspecified dementia with behavioral disturbance: Secondary | ICD-10-CM

## 2019-08-21 DIAGNOSIS — F0151 Vascular dementia with behavioral disturbance: Secondary | ICD-10-CM

## 2019-08-21 NOTE — Progress Notes (Signed)
   Neuropsychology Note  Jacqueline Bonilla completed 120 minutes of neuropsychological testing with this provider. The patient did not appear overtly distressed by the testing session per behavioral observation or via self-report.   Jacqueline Bonilla will return on 08/24/19 for another 120 minutes of cognitive testing. She will then return for an interactive feedback session with this provider on 08/27/19 at which time her test performances, clinical impressions and treatment recommendations will be reviewed in detail. The patient understands she can contact our office should she require our assistance before this time.  Full report to follow.

## 2019-08-21 NOTE — Telephone Encounter (Signed)
Jacqueline Bonilla,  Thank you for making me aware.  I reviewed recent neurology, cardiology and primary care notes as well as the MRI report.  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  Given that, it is not a good time for a routine screening colonoscopy because the sedation risks are increased by these issues.  Please cancel the upcoming appointments (make her husband aware), and I have copied this to primary care so they can refer the patient back to [name] at a later date when these issues are hopefully stable.  - HD  _________________  Dr. Mitchel Honour,   Please see above.  Thanks  - [facility]

## 2019-08-21 NOTE — Telephone Encounter (Signed)
Please review chart and advise on clearance for direct colon at [facility]  Patient has a hx of CVA on 06/25/2019 ---please advise as patient has been seen by neuro

## 2019-08-24 ENCOUNTER — Other Ambulatory Visit: Payer: Self-pay

## 2019-08-24 ENCOUNTER — Encounter: Payer: Self-pay | Admitting: Psychology

## 2019-08-24 ENCOUNTER — Encounter (HOSPITAL_BASED_OUTPATIENT_CLINIC_OR_DEPARTMENT_OTHER): Payer: BC Managed Care – PPO | Admitting: Psychology

## 2019-08-24 DIAGNOSIS — F0151 Vascular dementia with behavioral disturbance: Secondary | ICD-10-CM | POA: Diagnosis not present

## 2019-08-24 DIAGNOSIS — F0391 Unspecified dementia with behavioral disturbance: Secondary | ICD-10-CM

## 2019-08-24 NOTE — Addendum Note (Signed)
Addended by: Joelyn Oms R on: 08/24/2019 04:10 PM   Modules accepted: Level of Service

## 2019-08-24 NOTE — Telephone Encounter (Signed)
Called and spoke with patient's husband-[name]-verified DPR- Eduard Clos was informed of Dr. Loletha Carrow' recommendations and is agreeable to plan of care; patient's pre visit and procedure appts have been cancelled in Epic; [name] advised that PCP has the information as well and once patient is cleared for the colonoscopy-procedure will be scheduled;

## 2019-08-24 NOTE — Progress Notes (Signed)
   Neuropsychology Note  Jacqueline Bonilla completed another 120 minutes of neuropsychological testing with this provider. She agreed to complete MMPI-II via remote on-Screen administration of the assessment. The patient did not appear overtly distressed by the testing session, per behavioral observation or via self-report. Rest breaks were offered.    [name] will return for an interactive feedback session with this provider on 09/01/19 at which time her test performances, clinical impressions and treatment recommendations will be reviewed in detail. The patient understands she can contact our office should she require our assistance before this time.  Full report to follow.

## 2019-08-26 ENCOUNTER — Encounter (INDEPENDENT_AMBULATORY_CARE_PROVIDER_SITE_OTHER): Payer: BC Managed Care – PPO

## 2019-08-26 DIAGNOSIS — I6349 Cerebral infarction due to embolism of other cerebral artery: Secondary | ICD-10-CM

## 2019-08-26 DIAGNOSIS — I4891 Unspecified atrial fibrillation: Secondary | ICD-10-CM

## 2019-08-26 DIAGNOSIS — I639 Cerebral infarction, unspecified: Secondary | ICD-10-CM | POA: Diagnosis not present

## 2019-08-27 ENCOUNTER — Encounter (HOSPITAL_BASED_OUTPATIENT_CLINIC_OR_DEPARTMENT_OTHER): Payer: BC Managed Care – PPO | Admitting: Psychology

## 2019-08-27 ENCOUNTER — Other Ambulatory Visit: Payer: Self-pay

## 2019-08-27 DIAGNOSIS — F0391 Unspecified dementia with behavioral disturbance: Secondary | ICD-10-CM

## 2019-08-27 DIAGNOSIS — F0151 Vascular dementia with behavioral disturbance: Secondary | ICD-10-CM | POA: Diagnosis not present

## 2019-08-31 ENCOUNTER — Ambulatory Visit (HOSPITAL_COMMUNITY): Payer: BC Managed Care – PPO | Attending: Cardiology

## 2019-08-31 ENCOUNTER — Other Ambulatory Visit: Payer: Self-pay

## 2019-08-31 ENCOUNTER — Encounter: Payer: Self-pay | Admitting: Psychology

## 2019-08-31 ENCOUNTER — Other Ambulatory Visit (HOSPITAL_COMMUNITY): Payer: BC Managed Care – PPO

## 2019-08-31 DIAGNOSIS — I639 Cerebral infarction, unspecified: Secondary | ICD-10-CM | POA: Diagnosis not present

## 2019-08-31 LAB — ECHOCARDIOGRAM COMPLETE BUBBLE STUDY
Area-P 1/2: 3.72 cm2
S' Lateral: 2.8 cm

## 2019-08-31 NOTE — Progress Notes (Addendum)
[place]   Alfonso Ellis, PsyD,  Licensed Psychologist (Provisional) Clinical Neuropsychologist [address], [place], [place]  [address] Phone: [phone] Fax: [phone]   [place]! If you are not the patient, the patient's legal guardian/caregiver, or an individual for whom the patient has signed a release of information then do not proceed and contact the above mentioned provider. Thank you!   PATIENT:    Jacqueline Bonilla. Buena Irish DATE OF BIRTH:   11-Mar-1955 PROCEDURE:  Neuropsychological evaluation  DATE OF SERVICE:   08/27/2019 REFERRAL SOURCE:  [name] from [facility] MEDICAL NECESSITY:  To evaluate cognitive and emotional functioning in light of suspected memory loss and cognitive dysfunction and to assess for dementia.   SOURCES OF INFORMATION: The following information was gathered from a clinical interview with patient and her husband along with review of available medical records. The patient expressed understanding of the purpose of the evaluation and consented to all procedures.   HISTORY OF PRESENT ILLNESS: Jacqueline Bonilla interviewed in the presence of her husband, who also served as an Musician.  She presents with personality changes and psychotic features (e.g., delusions) along with gradual cognitive decline the past 3 1/2 years.  She admitted to having recent falls (approx. 3 times in lasts 6 months) and hitting her head on one occasion; she denied LOC. She described trouble   with pronunciation and notable word finding difficulties; she responds using short sentences to compensate for this change. She admitted to problems staying on topic as well as having "selective hearing". She mentioned problems reading musical notation but denied problems reading written material. She also described problems using her right hand that interferes  with dressing on occasion.     She acknowledged having some trouble recalling recent information but did not appear concerned about her long-term memory; she self-rated her memory function as 90/100. She admitted to occasionally misplacing things, but denied any problems with remembering names, conversations, forgetting the purpose for going into a room, recognizing faces, navigation, getting lost, driving, and following directions. She admitted to "slowing down" overall and noted reduced balance and coordination.    The patient's husband dates the onset of personality change approximately 3  years ago and revealed that she became severely paranoid at work and developed the belief that her co-workers were conspiring against her. He recalled gradually noticing other cognitive changes including ataxia, difficulty recalling previously learned information, motor weakness, and significant weight loss; she has recently gained 5 lbs. with improved diet and regular exercise. She is currently engaged in physical activity (e.g., aquatic fitness and free weights) around 5-6 days per week with positive effect noted.    The patient and her husband both agree that she has always been a happy and positive person with generally stable mood.  She sleeps 7-8 hours a night and denies any neurovegetative signs, but she has involuntarily lost around 10-15 lbs. in the last 9-10 months.  Her husband has observed decreased initiation and tendency to withdraw from social interaction.    Despite her proclivity toward social isolation, her husband notes that if she attends a social event, she is mostly pleasant, engaging, and socially appropriate.  The patient has residual pain in her hands and elbows from recent falls but she did not endorse other physical symptoms.  BEHAVIORAL OBSERVATIONS:  Mrs. Keady was appropriately dressed for season and situation and appeared tidy and well-groomed. Stature and  height were  unremarkable. She appeared somewhat thin and underweight. She appeared her chronological age. Motor abilities appeared somewhat reduced. Patient was mostly friendly but suspicious and mistrusting at times. Paranoid ideation noted. Speech was unremarkable. The patient was able to understand test directions. Mood was euthymic and affect was mood congruent. Attention and motivation were good. Insight was somewhat limited. Optimal test taking conditions were maintained.  TESTS ADMINISTERED:  Pension scheme manager  [place] (BNT)  Complex Ideational Material (CIM)  Controlled Oral Word Association (COWA)  Finger Tapping Test (FTT)  Grooved Pegboard   Hand Dynamometer   [employer] Inventory, 2nd Edition (MMPI-2)  Modified [place] Card Sorting Test (M-WCST)  Neuropsychological Assessment Battery (NAB), Judgement   Repeatable Battery for the Assessment of Neuropsychological Status (RBANS), Form A  Trail Making Test (Part A & B)  Wechsler Adult Intelligence Scale, 4th edition (WAIS-IV), selected subtests  VALIDITY OF EVALUATION: Scores on objective and embedded measures of performance validity were within normal limits, and there were no behavioral manifestations that suggested suboptimal effort or poor test engagement. As such, the following test results are considered valid and interpretable.  Mental Status: The patient was alert and fully oriented to person, place, time, and situation.  Attention and concentration were as expected.  Fund of information was typical.  Thinking was goal-directed and appeared normal from the perspective of productivity, relevance, and coherence with no preoccupations. The patient was able to form concepts well.  Judgment and decision-making appeared intact.    TEST RESULTS:   A detailed score summary is provided within the Scores Summary Table at the end of this report, presented as standardized scores obtained through comparisons  of the patient's performance to that of same age peers taking into account (whenever possible) the effects of age, education, and other demographic factors.   Baseline Intellectual Abilities: Performance on a measure of word reading was average. Taken together with previous educational and occupational history, the patient's premorbid abilities are estimated to be in the average range.    Mrs. Baby was given a neuropsychological screening battery (RBANS), which contains 12 subtests covering five neuropsychological domains. Her total scale score on that battery, a composite of performance across tests and estimate of global cognitive functioning, was 64, which was moderately impaired for her age (1st percentile). Performance on individual tests and domain scores are provided below.   Attention, Processing Speed, and Executive Cognitive Processes:  Working memory was below average overall and relative weakness while processing speed was impaired and clear deficit compared to predicted levels of functioning. Immediate auditory attention, working memory capacity and mental sequencing were average whereas mental computation was mildly impaired. She was able to repeat up to 6 digits forwards, 4 digits backwards, and 5 digits in numerical sequence. Her performance on another test of auditory attention (RBANS Digit Span) was low average and she repeated up to 5 digits in order.A test of processing speed with visual scanning (RBANS Coding) was severely impaired (2nd percentile).   Psychomotor speed involving simple sequencing and visual scanning was mildly impaired on a separate measure (Trails A). More complex sequencing requiring divided attention, mental flexibility, and set shifting was severely impaired for her age, gender, education, and ethnicity (1st percentile); she made 2 sequencing errors. The patient's ability to identify, maintain, and shift problem solving strategies in response to examiner  feedback was borderline impaired and most similar to individuals with a history of schizophrenia.  On a test of practical judgment (TOP-J) in which  Mrs. Downie was asked to provide solutions to everyday problems or situations, her score was within normal limits compared to a sample of healthy controls (Rabin et al. 2007).   Language Functions: The patient's speech was fluent and grammar and syntax were unremarkable. Mild word-finding problems were observed. Speech comprehension appeared intact and she performed average on formal measures of word reading (SS=101) and sentence comprehension (SS=94) to obtain a reading composite score of 97, which is average compared to individuals her age and similar levels of education. Her RBANS Language Index score of 85 is low average for her age (16th percentile). Semantic verbal fluency was borderline impaired (2nd percentile). Confrontation naming ranged from low average to average  on similar measures (RBANS-A Picture Naming=17th-25th percentile; BNT=27th percentile). Abstract verbal reasoning was average (WAIS-IV Similarities=50th percentile).   Visuospatial and Constructional Abilities: Visuospatial ability was impaired overall. Her RBANS Visuospatial Index score of 56 was severely impaired for her age (<1st percentile). Copy of a geometric figure (RBANS Figure Copy) and visuoperception (RBANS Line Orientation <2nd percentile) were both impaired for her age. Her performance on a measure of visual integration and organization (HVOT) was below average for age and education (Z=-1.2, 12th percentile).  Learning and Memory: Mrs. Alejos's RBANS Immediate Memory Index score of 69 was moderately impaired for her age (2nd percentile). Immediate recall for a list of 10 words read to her 4 times (RBANS List Learning) was moderately impaired (1st percentile). Immediate recall for a story read 2 times (RBANS Story Memory) was below average (9th percentile).   Mrs.  Bourget's RBANS Delayed Memory Index score of 92 was average for her age (30th percentile). Spontaneous recall for the list of words read to her after delay (RBANS List Recall) ranged from borderline impaired to below average (1 word, 3rd-9th percentile) and yes/no recognition for the words (RBANS List Recognition) was average and much improved (19/20, 26-50th percentile). Delayed recall for the story [facility] Story Recall) was average (25th percentile). Spontaneous recall for the copied figure (RBANS Figure Recall) scored low average (16th percentile).   Motor Functioning: Fine and gross motor function were relatively impaired with more impact noted on dominant side. Finger tapping on dominant side (Rt.) was mildly impaired for age, gender, race, and education and significantly below performance using non-dominant hand, which placed in the average range. No such difference was observed in grip strength.   Personality Functioning: Behaviorally, she showed minimal social inappropriateness but did show impulsivity and some signs of suspiciousness and paranoid ideation. She expressed increased suspiciousness when asked to provide solutions to everyday problems or situations (e.g., "these questions are making me think that you work for the government").   The [place] 2 is an assessment that elicits a wide range of self-descriptions scored to give a quantitative measurement of an individual's level of emotional adjustment and attitude toward test taking. She produced a questionably valid profile on an objective personality measure and scored somewhat high on validity scales related to defensiveness and response consistency. Thus, her profile was interpreted with some caution. It is possible that cognitive (e.g., inattention) and/or emotional factors (e.g. anxiety and depression) impacted inability to provide consistent responses. Overall, she tended to minimize or outright deny  psychological problems or negative impact on mood or mental health. She appears to blame most of her current problems on physiological or medical symptoms.    SUMMARY & IMPRESSION: Mrs. Vanduzer's scores on several measures that are used to assess the validity of the test results  were within normal limits. Behaviorally, she appeared motivated and persisted well. Overall, the current results are likely a valid estimate of her current capabilities. She did score somewhat high on validity scales related to defensiveness and response consistency on an objective personality measure; her profile was interpreted with caution. It could be that cognitive (e.g., inattention) and/or emotional factors (e.g. anxiety and depression) may have contributed toward an inability to provide consistent responses.   Mrs. Bowron's performance on neuropsychological testing appeared generally valid. On a battery of neuropsychological tests, she showed moderate cognitive impairment overall. Performance was relatively preserved for more basic attention, language, and delayed memory ability. However, she showed severe impairment in immediate memory, attention/mental tracking/working memory, verbal fluency, processing speed, visuospatial/constructional ability, executive functions, and fine motor coordination (worse on dominant side). Behaviorally, she showed signs of executive dysfunction including impulsivity and suspiciousness. Despite this, she showed some insight into her weaknesses and consequences of her decisions and performance on a judgment task was strong.  With regard to personality functioning, she tends to minimize or deny negative impact on mood or mental health and is more likely to blame primary problems on physiological or medical symptoms.   Functionally, the patient's marked deficits in visuospatial/constructional ability, encoding and immediate memory, as well as most executive functions can be anticipated to  cause difficulties in daily functioning.  For example, she may misplace important household items like keys or glasses, forget to lock doors, miss medical appointments, forget to take medication (or forget that she has already taken her medication), etc.  Handling of personal and financial affairs may also be problematic and warrants supervision.  Deficiencies in executive functioning suggest that she may have difficulty with planning, organizing, following through with daily life activities, and coming up with appropriate solutions to problem situations.  Increased apathy and chronic inertia, in which the patient may spend days doing very little, are also common behavioral manifestations of executive dysfunction. Her husband is currently helping out by structuring physical fitness and diet routines during the week and provided assistance with daily activities as needed. The patient is fully dependant on husband for complex (e.g., driving, managing finances and medical appointments) and most instrumental tasks such as cooking, household chores, gardening and yard work, grocery shopping, etc.).   We now have a strong baseline for the patient going forward if there are any progressive changes noted. Initially, the first evaluation is utilized to compare performance to a normative population. 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 Follow-up with [name]:.  . Given report of visual hallucinations and delusions, could consider an off label trial of Nuplazid. Of course, there is a cost-benefit analysis that must be considered carefully and it may not be possible to treat all of the patient's symptoms without some cognitive side effects. . If  agitation worsens, or begins to significantly affect daily life, then consider Nuedexta.  . Considering the noted medical history, it is recommended that the patient strictly comply with prescribed medical treatments for cerebrovascular risk factors (e.g., high cholesterol, high blood pressure, sleep apnea, diabetes). . Considering the noted medical history, the patient is at increased risk for progression of cognitive impairment. Therefore, it is recommended that the patient aggressively manage any modifiable risk factors for further cognitive decline such as strict compliance  with prescribed medical treatments for any cerebrovascular risk factors (e.g., high cholesterol, high blood pressure, sleep apnea, diabetes). . Non-pharmacological treatments for vascular disease:  - Exercise and stretching - Speech/physical therapy - Balance-related exercises - Occupational therapy  2. Due to the nature and severity of the symptoms noted during this evaluation, it is recommended that the patient remain under 24-hour care and supervision, as the cognitive deficits noted represent a safety risk if left alone for extended periods of time.  As long as a spouse or family member that can stay with the patient remains healthy, no changes are needed in living situation.   3. Continued assistance with certain activities of daily living (e.g., medication and financial management) is recommended.    4. It is recommended that the severity of the patient's impairments is considered when assessing the level of asset management required. For example, given impairment higher-level reasoning and problem-solving skills, it is recommended that the patient consult with a family member or another trusted advisor prior to making any important medical, legal, or financial decisions. Establishing or continuing to rely upon someone who has Power of Musician and medical decision-making is recommended.    5. The  patient should refrain from driving, as deficits noted on testing could affect one's ability to safely operate a motor vehicle.    6. [email or web address] is a link is to The Caregiver's Handbook: A comprehensive guide with tools, resources, and in-depth solutions to some of caregiving's toughest challenges.   7. The patient should wear identification at all times, in case the individual becomes lost and disoriented.   8. The patient is encouraged to attend to lifestyle factors for brain health (e.g., regular physical exercise, good nutrition habits, regular participation in cognitively-stimulating activities, and general stress management techniques), which are likely to have benefits for both emotional adjustment and cognition.  In fact, in addition to promoting general good health, regular exercise incorporating aerobic activities (e.g., brisk walking, jogging, bicycling, etc.) has been demonstrated to be a very effective treatment for depression and stress, with similar efficacy rates to both antidepressant medication and psychotherapy. And for those with orthopedic issues, water aerobics may be particularly beneficial.  9. Included with the report is a flyer that includes activities that have therapeutic value and might be useful to keep you cognitively stimulated; these lists were compiled by therapists at the [facility], and are categorized by level of difficulty; this is not a substitute for therapy. This information can also be found at the following:  [email or web address]://[email or web address]/get-to-know-[email or web address]/[email or web address]-[email or web address]-center/neuro-rehab-apps-and-games/  10. Nutritional factors can have a significant effect on psychological and emotional status, as well as overall brain functioning.  The following general recommendations have been associated with improvements in depression and other psychological symptoms, as well as lower  risk for dementia and other forms of cognitive impairment.  Please discuss these recommendations with your physician and/or dietitian before initiating:  . Consume a wide variety of fresh fruits and vegetables, particularly including brightly colored items such as berries, oranges, tomatoes, peppers, carrots, broccoli, spinach, dark green lettuces, sweet potatoes, etc., all of which are high in vitamins and antioxidants.  . Consume foods that are high in fiber, such as legumes (e.g., beans, peas, lentils) and foods made from whole grains (e.g., whole wheat bread and pasta)  . Consume a significant amount of omega-3 essential fats and oils.  These can be found in natural food sources such as salmon and other fatty fish, and also products made from flax  seed and flax seed oil.  Alternately, dietary supplementation with fish oil capsules and flax seed oil capsules is a good way to boost one's level of omega-3 consumption.  It is important to check with your doctor before taking these supplements, especially if you take blood thinning medication.  . If you do not already do so, consider taking a quality multivitamin supplement under the guidance of your primary care physician.   . Consider keeping consumption of the following foods to a minimum: 1) foods made from white flour and white sugar; 2) artificial sweeteners; 3) deep-fried foods; 4) animal fat other than fish; 5) any foods containing "hydrogenated" or "trans" fats; 6) most other types of highly processed packaged/prepared foods.   11. Neuropsychological re-evaluation is recommended as needed to monitor treatment efficacy, to assist with the management of the patient (start or continue rehab or pharmacological therapy), to determine any clinical and functional significance of brain abnormality over time, as well as to document any potential improvement or decline in cognitive functioning. Lastly, any follow-up testing will help delineate the specific  cognitive basis of any new functional complaints.   12. Try to keep in mind that common word finding errors are not necessarily the start of a dreadful decline. Over-focusing on these errors can contribute to further distraction and emotions that can detract from effective retrieval of words; this in turn, can lead to greater distress and more difficulties with recalling the specific word you were looking for to begin with.   The following are several strategies that may help:  . Performance will generally be best in a structured, routine, and familiar environment, as opposed to situations involving complex problems.  Marlene Lard a place to keep your keys, wallet, cell phone, and other personal belongings. . Take time to register and process information to be remembered. Deeper encoding of information can be gained by forming a mental picture, making meaningful associations, connecting new information to previously learned and related information, paraphrasing and repetition.  . To the extent possible, multitasking should be avoided; break down tasks into smaller steps to help get started and to keep from feeling overwhelmed. And if there are difficulties in organization and planning, maintaining a daily organizer to help keep track of important appointments and information may be beneficial.   . Memory problems may at least be minimally addressed using compensatory strategies such as the use of a daily schedule to follow, memos, portable recorder, a centrally located bulletin board, or memory notebook. A large calendar, placed in a highly visible location would be valuable to keep track of dates and appointments.  In addition, it would be helpful to keep a log of all of medical appointments with the name of the doctor, date of visit, diagnoses, and treatments.  . Use of a medication box is recommended to ensure compliance and decrease confusion regarding medication dosages, times, and dates. . To aid in  managing problems with attention, the patient may consider using some of the following strategies: o The patient should simplify tasks.  There may be a need to break overly complex activities into simple step-by-step tasks, keep these steps written down in a note book and then check them off as they are completed which will help to stay on task and make sure the whole task is finished.  o The patient should set deadlines for everything, even for seemingly small tasks, prioritize time-sensitive tasks and write down every assignment, message, or important thought. o The patient is encouraged  to use timers and alarms to stay on track and take breaks at regular intervals. Avoid piles of paperwork or procrastination by dealing with each item as it comes in.  Techniques for making living spaces safer:  . Make the stovetop and oven inaccessible by removing or covering the knobs . Secure all medications in a safe place . Unplug appliances when not in use . Reduce tripping hazards such as moving any cords out of the way . Could consider marking the edges of steps with colored tape . Install night lights throughout the house  Preventing fraudulent activity: . Limit access to credit cards or cash . Could consider placing a sign on the front door such as "No Solicitations" . Could contact the [facility] "Do Not Call" list and add the person's number. They can be reached at [phone]   Communication Techniques  . Maintain a regular schedule for as many activities as possible. . Practice reality orientation. . Repeat information quietly and firmly. Marland Kitchen Keep the voice low and calm and be rational and understanding. . Talk in a warm and encouraging manner. . Talk gently and calmly; smile and be relaxed. . Use normal voice tone, do not be condescending. . Talk in a quiet place without distraction. . Show respect and acceptance. . Use one sentence for each idea and check to see if the  patient understands before proceeding. . Do not interrupt, signal acceptance of message by nods, or smiles when appropriate, and repeat what the patient has said when the message is complete. Marland Kitchen Keep stress, arguing, disagreements, and verbal tirades to a minimum, as any additional stressor on the patient will cause continued and more rapid deterioration.  In general, the best way to manage the behavioral and psychological symptoms of dementia such as agitation involves behavioral strategies such as using the three R's.  o Redirection (help distract your loved one by focusing their attention on something else, moving them to a new environment, or otherwise engaging them in something other than what is distressing to them)  o Reassurance (reassure them that you are there to take care of them and that there is nothing they need to be worried about), and  o Reconsidering (consider the situation from their perspective and try to identify if there is something about the situation or environment that may be triggering their reaction).  Hallucinations are false perceptions of objects or events involving the senses. These false perceptions can be caused by changes within the brain that result from Alzheimer's, usually in the later stages of the disease, although they can also occur as a result of vision loss. The following strategies may be helpful in responding to the patient's hallucinations: o Offer reassurance - Respond in a calm, supportive manner. You may want to respond with, "Don't worry. I'm here. I'll protect you. I'll take care of you." - Gentle patting may turn the person's attention toward you and reduce the hallucination. - Acknowledge the feelings behind the hallucination and try to find out what the hallucination means to the individual. You might want to say, "It sounds as if you're worried" or "I know this is frightening for you." o Use distractions - Suggest a walk or move to another room.  Frightening hallucinations often subside in well-lit areas where other people are present. - Try to turn the person's attention to music, conversation or activities you enjoy together. o Respond honestly - If the person asks you about a hallucination or delusion, be honest.  For example, if he or she asks, "Do you see him?" you may want to answer with, "I know you see something, but I don't see it." This way, you're not denying what the person sees or hears, but you avoid an argument. o Modify the environment - Check for sounds that might be misinterpreted, such as noise from a television or an air conditioner. - Look for lighting that casts shadows, reflections or distortions on the surfaces of floors, walls and furniture. Turn on lights to reduce shadows. - Cover mirrors with a cloth or remove them if the person thinks that he or she is looking at a stranger.  Delusions (firmly held beliefs in things that are not real) may occur in middle-to-late-stage dementia. Confusion and memory loss -- such as the inability to remember certain people or objects -- can contribute to these untrue beliefs. A person with dementia may believe a family member is stealing his or her possessions or that he or she is being followed by the police. This kind of suspicious delusion is sometimes referred to as paranoia. Although not grounded in reality, the situation is very real to the person with dementia. Keep in mind that a person with dementia is trying to make sense of his or her world with declining cognitive function. A delusion is not the same thing as a hallucination. While delusions involve false beliefs, hallucinations are false perceptions of objects or events that are sensory in nature. The following strategies may be helpful in responding to delusions: o Don't take offense. Listen to what is troubling the person, and try to understand that reality. Then be reassuring, and let the person know you care. o Don't  argue or try to convince. Allow the individual to express ideas. Acknowledge his or her opinions. o Offer a simple answer. Share your thoughts with the individual, but keep it simple. Don't overwhelm the person with lengthy explanations or reasons. o Switch the focus to another activity. Engage the individual in an activity, or ask for help with a chore. o Duplicate any lost items. If the person is often searching for a specific item, have several available (if possible). For example, if the individual is always looking for his or her wallet, purchase two of the same kind.  If you have any questions, please contact us at [phone].   This report is intended solely for the confidential review and use by the referring professional to assist in diagnostic and medical decision making needs.  This report should not be released to a third party without proper consent. [NOTE: data can be made available to qualified professionals with permission from the patient or legal representative/caregiver]    ____________________________________ Jan Fireman, PsyD,  Licensed Psychologist (Provisional) Clinical Neuropsychologist     Subtest/Composite Raw Score Standard Score 90% Confidence Interval Percentile Rank Descriptive Category Grade Equivalent Growth Scale Value  Word Reading 61 101 95 - 107 53 Average >12.9 544  Sentence Comprehension 41 94 86 - 102 34 Average 10.9 526  Reading Composite 195 97 91 - 103 42 Average - -   Wechsler Adult Intelligence Scale, 4th Edition (WAIS-IV)  Working Memory Index (WMI)  SS=83, 13%   Processing Speed Index (PSI)  SS=59, <1%  RBANS-Form A Measure Standard or Scaled Score Percentile Description  Immediate Memory 69 2 Mildly Impaired  List Learning 3 1 Severely Impaired  Story Memory 6 9 Below Average  Visuospatial/Constructional 56 <1 Severly Impaired  Figure Copy 1 <1 Severly Impaired  Line Orientation - <2 Impaired  Language 85 16 Low  Average  Picture Naming - 17-25 Low Average-Average  Semantic Fluency 4 2 Mildly Impaired  Attention 60 56 Severely Impaired  Digit Span 7 16 Low Average  Coding 2 <1 Severely Impaired  Delayed Memory 92 30 Average  List Recall - 3-9 Borderline-Below Average  List Recognition - 26-50 Average  Story Recall 8 25 Average  Figure Recall 7 16 Low Average  Total Score 64 1 Severely Impaired   [place] Naming Test  Total=54/60, T=44, 27%  Complex Ideational Material   Total=10/12, Below Average   Hooper Visual Organization Test (HVOT)  Z=-1.2, 12%  Finger Tapping Test   Dominant (Rt.)= 29, T=32, 4%  Non-Dominant= 42, T=56, 73%  Grooved Pegboard   Dominant (Rt.)= D/C, 194" to finish 1st row   Non-Dominant= D/C, 120" to finish 1st row  Dyne[employer], 2nd edition (MMPI-II)  Scale T-Scores Scale T-Scores  L (Lie) 71 4 (Psychopathic Deviate) 51  F (Infrequency) 58 5 (Masculinity/Femininity) 67  K (Correction) 67 6 (Paranoia) 42  VRIN 46 7 (Psychasthenia) 53  TRIN 34F 8 (Schizophrenia) 66  1 (Hypochondriasis) 65 9 (Hypomania) 59  2 (Depression) 51 0 (Social Introversion) 48  3 (Hysteria) 65     *T-Scores above 65 are considered significant  Modified Engineer, water (M-WCST)       Raw   T-Score % Description           # Categories Correct   4  35  7 Borderline  # Perseverative Errors  5  39  14 Low Average    # Total Errors    20  34  5 Borderline Executive Function Composite -  SS=79  8 Borderline  Test of Practical Judgment (TOP-J), 9 Item Total=25/27, WNL  Trail Making Test   Part A, 62", T=32, 4% (Errors=0)  Part B=240", T=25, 1% (Errors=2)

## 2019-09-01 ENCOUNTER — Encounter: Payer: BC Managed Care – PPO | Admitting: Psychology

## 2019-09-01 ENCOUNTER — Other Ambulatory Visit: Payer: Self-pay

## 2019-09-01 DIAGNOSIS — F0151 Vascular dementia with behavioral disturbance: Secondary | ICD-10-CM

## 2019-09-01 DIAGNOSIS — F0391 Unspecified dementia with behavioral disturbance: Secondary | ICD-10-CM

## 2019-09-08 ENCOUNTER — Encounter: Payer: Self-pay | Admitting: Psychology

## 2019-09-08 NOTE — Progress Notes (Signed)
Neuropsychology Feedback Appointment [name] and her husband returned for a feedback appointment today to review the results of her recent neuropsychological evaluation with this provider.  60 minutes face-to-face time was spent reviewing her test results, my impressions and my recommendations as detailed in her report. The patient and husband were given the opportunity to ask questions, and I did my best to answer these to their satisfaction.   Below you will find a summary of her test results, my clinical impressions, and recommendations. The full neuropsychological report can be found in her chart dated 08/27/19. The initial consultation and clinical interview can also be found in her chart dated 08/04/19.    SUMMARY & IMPRESSION: Jacqueline Bonilla's scores on several measures that are used to assess the validity of the test results were within normal limits. Behaviorally, she appeared motivated and persisted well. Overall, the current results are likely a valid estimate of her current capabilities. She did score somewhat high on validity scales related to defensiveness and response consistency on an objective personality measure; her profile was interpreted with caution. It could be that cognitive (e.g., inattention) and/or emotional factors (e.g. anxiety and depression) may have contributed toward an inability to provide consistent responses.   Jacqueline Bonilla's performance on neuropsychological testing appeared generally valid. On a battery of neuropsychological tests, she showed moderate cognitive impairment overall. Performance was relatively preserved for more basic attention, language, and delayed memory ability. However, she showed severe impairment in immediate memory, attention/mental tracking/working memory, verbal fluency, processing speed, visuospatial/constructional ability, executive functions, and fine motor coordination (worse on dominant side). Behaviorally, she showed signs of  executive dysfunction including impulsivity and suspiciousness. Despite this, she showed some insight into her weaknesses and consequences of her decisions and performance on a judgment task was strong.  With regard to personality functioning, she tends to minimize or deny negative impact on mood or mental health and is more likely to blame primary problems on physiological or medical symptoms.   Functionally, the patient's marked deficits in visuospatial/constructional ability, encoding and immediate memory, as well as most executive functions can be anticipated to cause difficulties in daily functioning.  For example, she may misplace important household items like keys or glasses, forget to lock doors, miss medical appointments, forget to take medication (or forget that she has already taken her medication), etc.  Handling of personal and financial affairs may also be problematic and warrants supervision.  Deficiencies in executive functioning suggest that she may have difficulty with planning, organizing, following through with daily life activities, and coming up with appropriate solutions to problem situations.  Increased apathy and chronic inertia, in which the patient may spend days doing very little, are also common behavioral manifestations of executive dysfunction. Her husband is currently helping out by structuring physical fitness and diet routines during the week and provided assistance with daily activities as needed. The patient is fully dependant on husband for complex (e.g., driving, managing finances and medical appointments) and most instrumental tasks such as cooking, household chores, gardening and yard work, grocery shopping, etc.).   We now have a strong baseline for the patient going forward if there are any progressive changes noted. Initially, the first evaluation is utilized to compare performance to a normative population. 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 Follow-up with [name]:.  . Given report of visual hallucinations and delusions, could consider an off label trial of Nuplazid. Of course, there is a cost-benefit analysis that must be considered carefully and it may not be possible to treat all of the patient's symptoms without some cognitive side effects. . If agitation worsens, or begins to significantly affect daily life, then consider Nuedexta.  . Considering the noted medical history, it is recommended that the patient strictly comply with prescribed medical treatments for cerebrovascular risk factors (e.g., high cholesterol, high blood pressure, sleep apnea, diabetes). . Considering the noted medical history, the patient is at increased risk for progression of cognitive impairment. Therefore, it is recommended that the patient aggressively manage any modifiable risk factors for further cognitive decline such as strict compliance with prescribed medical treatments for any cerebrovascular risk factors (e.g., high cholesterol, high blood pressure, sleep apnea, diabetes). . Non-pharmacological treatments for vascular disease:  - Exercise and stretching - Speech/physical therapy - Balance-related exercises - Occupational therapy  2. Due to the nature and severity of the symptoms noted during this evaluation, it is recommended that the patient remain under 24-hour care and supervision, as the cognitive deficits noted represent a safety risk if left alone for extended periods of time.  As long as a spouse or family member that can stay with the patient remains healthy, no changes are needed in living situation.   3. Continued assistance with certain activities of daily living (e.g.,  medication and financial management) is recommended.    4. It is recommended that the severity of the patient's impairments is considered when assessing the level of asset management required. For example, given impairment higher-level reasoning and problem-solving skills, it is recommended that the patient consult with a family member or another trusted advisor prior to making any important medical, legal, or financial decisions. Establishing or continuing to rely upon someone who has Power of Musician and medical decision-making is recommended.    5. The patient should refrain from driving, as deficits noted on testing could affect one's ability to safely operate a motor vehicle.    6. [email or web address] is a link is to The Caregiver's Handbook: A comprehensive guide with tools, resources, and in-depth solutions to some of caregiving's toughest challenges.   7. The patient should wear identification at all times, in case the individual becomes lost and disoriented.   8. The patient is encouraged to attend to lifestyle factors for brain health (e.g., regular physical exercise, good nutrition habits, regular participation in cognitively-stimulating activities, and general stress management techniques), which are likely to have benefits for both emotional adjustment and cognition.  In fact, in addition to promoting general good health, regular exercise incorporating aerobic activities (e.g., brisk walking, jogging, bicycling, etc.) has been demonstrated to be a very effective treatment for depression and stress, with similar efficacy rates to both antidepressant medication and psychotherapy. And for those with orthopedic issues, water aerobics may be particularly beneficial.  9. Included with the report is a flyer that includes activities that have therapeutic value and might be useful to keep you cognitively stimulated; these lists were  compiled by therapists at the [facility], and are categorized by level of difficulty; this is not a substitute for therapy. This information can also be found at the following:  https://www.[facility].org/get-to-know-barrow/centers-programs/neurorehabilitation-center/neuro-rehab-apps-and-games/  10. Nutritional factors can have a significant effect on psychological and emotional status, as well as overall brain functioning.  The following general recommendations have  been associated with improvements in depression and other psychological symptoms, as well as lower risk for dementia and other forms of cognitive impairment.  Please discuss these recommendations with your physician and/or dietitian before initiating:  . Consume a wide variety of fresh fruits and vegetables, particularly including brightly colored items such as berries, oranges, tomatoes, peppers, carrots, broccoli, spinach, dark green lettuces, sweet potatoes, etc., all of which are high in vitamins and antioxidants.  . Consume foods that are high in fiber, such as legumes (e.g., beans, peas, lentils) and foods made from whole grains (e.g., whole wheat bread and pasta)  . Consume a significant amount of omega-3 essential fats and oils.  These can be found in natural food sources such as salmon and other fatty fish, and also products made from flax seed and flax seed oil.  Alternately, dietary supplementation with fish oil capsules and flax seed oil capsules is a good way to boost one's level of omega-3 consumption.  It is important to check with your doctor before taking these supplements, especially if you take blood thinning medication.  . If you do not already do so, consider taking a quality multivitamin supplement under the guidance of your primary care physician.   . Consider keeping consumption of the following foods to a minimum: 1) foods made from white flour and white sugar; 2) artificial sweeteners; 3)  deep-fried foods; 4) animal fat other than fish; 5) any foods containing "hydrogenated" or "trans" fats; 6) most other types of highly processed packaged/prepared foods.   11. Neuropsychological re-evaluation is recommended as needed to monitor treatment efficacy, to assist with the management of the patient (start or continue rehab or pharmacological therapy), to determine any clinical and functional significance of brain abnormality over time, as well as to document any potential improvement or decline in cognitive functioning. Lastly, any follow-up testing will help delineate the specific cognitive basis of any new functional complaints.   12. Try to keep in mind that common word finding errors are not necessarily the start of a dreadful decline. Over-focusing on these errors can contribute to further distraction and emotions that can detract from effective retrieval of words; this in turn, can lead to greater distress and more difficulties with recalling the specific word you were looking for to begin with.   The following are several strategies that may help:  . Performance will generally be best in a structured, routine, and familiar environment, as opposed to situations involving complex problems.  Marlene Lard a place to keep your keys, wallet, cell phone, and other personal belongings. . Take time to register and process information to be remembered. Deeper encoding of information can be gained by forming a mental picture, making meaningful associations, connecting new information to previously learned and related information, paraphrasing and repetition.  . To the extent possible, multitasking should be avoided; break down tasks into smaller steps to help get started and to keep from feeling overwhelmed. And if there are difficulties in organization and planning, maintaining a daily organizer to help keep track of important appointments and information may be beneficial.   . Memory problems may  at least be minimally addressed using compensatory strategies such as the use of a daily schedule to follow, memos, portable recorder, a centrally located bulletin board, or memory notebook. A large calendar, placed in a highly visible location would be valuable to keep track of dates and appointments.  In addition, it would be helpful to keep a log of all of medical  appointments with the name of the doctor, date of visit, diagnoses, and treatments.  . Use of a medication box is recommended to ensure compliance and decrease confusion regarding medication dosages, times, and dates. . To aid in managing problems with attention, the patient may consider using some of the following strategies: o The patient should simplify tasks.  There may be a need to break overly complex activities into simple step-by-step tasks, keep these steps written down in a note book and then check them off as they are completed which will help to stay on task and make sure the whole task is finished.  o The patient should set deadlines for everything, even for seemingly small tasks, prioritize time-sensitive tasks and write down every assignment, message, or important thought. o The patient is encouraged to use timers and alarms to stay on track and take breaks at regular intervals. Avoid piles of paperwork or procrastination by dealing with each item as it comes in.  Techniques for making living spaces safer:  . Make the stovetop and oven inaccessible by removing or covering the knobs . Secure all medications in a safe place . Unplug appliances when not in use . Reduce tripping hazards such as moving any cords out of the way . Could consider marking the edges of steps with colored tape . Install night lights throughout the house  Preventing fraudulent activity: . Limit access to credit cards or cash . Could consider placing a sign on the front door such as "No Solicitations" . Could contact the [place]  "Do Not Call" list and add the person's number. They can be reached at [phone]   Communication Techniques  . Maintain a regular schedule for as many activities as possible. . Practice reality orientation. . Repeat information quietly and firmly. Marland Kitchen Keep the voice low and calm and be rational and understanding. . Talk in a warm and encouraging manner. . Talk gently and calmly; smile and be relaxed. . Use normal voice tone, do not be condescending. . Talk in a quiet place without distraction. . Show respect and acceptance. . Use one sentence for each idea and check to see if the patient understands before proceeding. . Do not interrupt, signal acceptance of message by nods, or smiles when appropriate, and repeat what the patient has said when the message is complete. Marland Kitchen Keep stress, arguing, disagreements, and verbal tirades to a minimum, as any additional stressor on the patient will cause continued and more rapid deterioration.  In general, the best way to manage the behavioral and psychological symptoms of dementia such as agitation involves behavioral strategies such as using the three R's.  o Redirection (help distract your loved one by focusing their attention on something else, moving them to a new environment, or otherwise engaging them in something other than what is distressing to them)  o Reassurance (reassure them that you are there to take care of them and that there is nothing they need to be worried about), and  o Reconsidering (consider the situation from their perspective and try to identify if there is something about the situation or environment that may be triggering their reaction).  Hallucinations are false perceptions of objects or events involving the senses. These false perceptions can be caused by changes within the brain that result from Alzheimer's, usually in the later stages of the disease, although they can also occur as a result of vision loss. The following  strategies may be helpful in responding to the  patient's hallucinations: o Offer reassurance - Respond in a calm, supportive manner. You may want to respond with, "Don't worry. I'm here. I'll protect you. I'll take care of you." - Gentle patting may turn the person's attention toward you and reduce the hallucination. - Acknowledge the feelings behind the hallucination and try to find out what the hallucination means to the individual. You might want to say, "It sounds as if you're worried" or "I know this is frightening for you." o Use distractions - Suggest a walk or move to another room. Frightening hallucinations often subside in well-lit areas where other people are present. - Try to turn the person's attention to music, conversation or activities you enjoy together. o Respond honestly - If the person asks you about a hallucination or delusion, be honest. For example, if he or she asks, "Do you see him?" you may want to answer with, "I know you see something, but I don't see it." This way, you're not denying what the person sees or hears, but you avoid an argument. o Modify the environment - Check for sounds that might be misinterpreted, such as noise from a television or an air conditioner. - Look for lighting that casts shadows, reflections or distortions on the surfaces of floors, walls and furniture. Turn on lights to reduce shadows. - Cover mirrors with a cloth or remove them if the person thinks that he or she is looking at a stranger.  Delusions (firmly held beliefs in things that are not real) may occur in middle-to-late-stage dementia. Confusion and memory loss -- such as the inability to remember certain people or objects -- can contribute to these untrue beliefs. A person with dementia may believe a family member is stealing his or her possessions or that he or she is being followed by the police. This kind of suspicious delusion is sometimes referred to as paranoia. Although not  grounded in reality, the situation is very real to the person with dementia. Keep in mind that a person with dementia is trying to make sense of his or her world with declining cognitive function. A delusion is not the same thing as a hallucination. While delusions involve false beliefs, hallucinations are false perceptions of objects or events that are sensory in nature. The following strategies may be helpful in responding to delusions: o Don't take offense. Listen to what is troubling the person, and try to understand that reality. Then be reassuring, and let the person know you care. o Don't argue or try to convince. Allow the individual to express ideas. Acknowledge his or her opinions. o Offer a simple answer. Share your thoughts with the individual, but keep it simple. Don't overwhelm the person with lengthy explanations or reasons. o Switch the focus to another activity. Engage the individual in an activity, or ask for help with a chore. o Duplicate any lost items. If the person is often searching for a specific item, have several available (if possible). For example, if the individual is always looking for his or her wallet, purchase two of the same kind.  If you have any questions, please contact us at [phone].   This report is intended solely for the confidential review and use by the referring professional to assist in diagnostic and medical decision making needs.  This report should not be released to a third party without proper consent. [NOTE: data can be made available to qualified professionals with permission from the patient or legal representative/caregiver]    ____________________________________  Jan Fireman, PsyD,  Licensed Psychologist (Provisional) Clinical Neuropsychologist

## 2019-09-14 ENCOUNTER — Ambulatory Visit: Payer: BC Managed Care – PPO | Admitting: Emergency Medicine

## 2019-09-15 ENCOUNTER — Encounter: Payer: BC Managed Care – PPO | Admitting: Gastroenterology

## 2019-09-15 ENCOUNTER — Telehealth: Payer: Self-pay

## 2019-09-15 NOTE — Telephone Encounter (Signed)
I think he still qualifies for Cologuard.  Lets order one please.  Thanks.

## 2019-09-15 NOTE — Telephone Encounter (Signed)
I do not kn ow of any other course of action besides cologuard. But I am not sure the age limit on the cologuard. Please advise on what your thoughts on the next step

## 2019-09-15 NOTE — Telephone Encounter (Signed)
Pt.'s husband called to inform the practice that the colonoscopy the pt had been referred for has been canceled due to the Dr. Not being comfortable sedating the pt. The pt.'s husband wanted to know if there was any alternative course of action

## 2019-09-16 NOTE — Telephone Encounter (Signed)
Patient husband was informed we place an order for a cologuard kit to be sent out to their house. If they have any question call the number on the form. If he have any trouble feel free to give our office a call

## 2019-09-16 NOTE — Addendum Note (Signed)
Addended by: Norton Blizzard R on: 7/54/3606 03:51 PM   Modules accepted: Orders

## 2019-09-17 NOTE — Telephone Encounter (Signed)
On 09/16/2019, faxed Cologuard requisition to [facility]. Confirmation page 5:57 pm.

## 2019-09-28 ENCOUNTER — Other Ambulatory Visit: Payer: Self-pay | Admitting: Internal Medicine

## 2019-09-28 DIAGNOSIS — I6349 Cerebral infarction due to embolism of other cerebral artery: Secondary | ICD-10-CM

## 2019-09-28 DIAGNOSIS — I639 Cerebral infarction, unspecified: Secondary | ICD-10-CM

## 2019-09-28 DIAGNOSIS — I4891 Unspecified atrial fibrillation: Secondary | ICD-10-CM

## 2019-10-06 DIAGNOSIS — R278 Other lack of coordination: Secondary | ICD-10-CM | POA: Diagnosis not present

## 2019-10-06 DIAGNOSIS — R202 Paresthesia of skin: Secondary | ICD-10-CM | POA: Diagnosis not present

## 2019-10-06 DIAGNOSIS — R531 Weakness: Secondary | ICD-10-CM | POA: Diagnosis not present

## 2019-10-06 DIAGNOSIS — R419 Unspecified symptoms and signs involving cognitive functions and awareness: Secondary | ICD-10-CM | POA: Diagnosis not present

## 2019-10-08 DIAGNOSIS — R278 Other lack of coordination: Secondary | ICD-10-CM | POA: Diagnosis not present

## 2019-10-08 DIAGNOSIS — R419 Unspecified symptoms and signs involving cognitive functions and awareness: Secondary | ICD-10-CM | POA: Diagnosis not present

## 2019-10-08 DIAGNOSIS — R202 Paresthesia of skin: Secondary | ICD-10-CM | POA: Diagnosis not present

## 2019-10-08 DIAGNOSIS — R531 Weakness: Secondary | ICD-10-CM | POA: Diagnosis not present

## 2019-10-12 ENCOUNTER — Telehealth: Payer: Self-pay | Admitting: Neurology

## 2019-10-12 NOTE — Telephone Encounter (Signed)
Pt's husband, Jacqueline Bonilla (on [name]) called, want to make sure physician received fax before the appt in the morning.

## 2019-10-12 NOTE — Telephone Encounter (Signed)
Spoke with husband [place]. He will fax an update of pt's condition to [phone].

## 2019-10-13 ENCOUNTER — Ambulatory Visit: Payer: BC Managed Care – PPO | Admitting: Neurology

## 2019-10-13 ENCOUNTER — Other Ambulatory Visit: Payer: Self-pay

## 2019-10-13 ENCOUNTER — Encounter: Payer: Self-pay | Admitting: Neurology

## 2019-10-13 VITALS — BP 121/78 | HR 93 | Ht 64.0 in | Wt 114.0 lb

## 2019-10-13 DIAGNOSIS — F0151 Vascular dementia with behavioral disturbance: Secondary | ICD-10-CM | POA: Diagnosis not present

## 2019-10-13 DIAGNOSIS — R278 Other lack of coordination: Secondary | ICD-10-CM | POA: Diagnosis not present

## 2019-10-13 DIAGNOSIS — R419 Unspecified symptoms and signs involving cognitive functions and awareness: Secondary | ICD-10-CM | POA: Diagnosis not present

## 2019-10-13 DIAGNOSIS — R202 Paresthesia of skin: Secondary | ICD-10-CM | POA: Diagnosis not present

## 2019-10-13 DIAGNOSIS — R531 Weakness: Secondary | ICD-10-CM | POA: Diagnosis not present

## 2019-10-13 DIAGNOSIS — F01518 Vascular dementia, unspecified severity, with other behavioral disturbance: Secondary | ICD-10-CM | POA: Insufficient documentation

## 2019-10-13 NOTE — Patient Instructions (Addendum)
Follow up 6 months with neurology  Vascular Dementia Dementia is a condition in which a person has problems with thinking, memory, and behavior that are severe enough to interfere with daily life. Vascular dementia is a type of dementia. It results from brain damage that is caused by the brain not getting enough blood. This condition may also be called vascular cognitive impairment. What are the causes? Vascular dementia is caused by conditions that lessen blood flow to the brain. Common causes of this condition include:  Multiple small strokes. These may happen without symptoms (silent stroke).  Major stroke.  Damage to small blood vessels in the brain (cerebral small vessel disease). What increases the risk? The following factors may make you more likely to develop this condition:  Having had a stroke.  Having high blood pressure (hypertension) or high cholesterol.  Having a disease that affects the heart or blood vessels.  Smoking.  Having diabetes.  Having metabolic syndrome.  Being obese.  Not being active.  Having depression.  Being over age 76. What are the signs or symptoms? Symptoms can vary from one person to another. Symptoms may be mild or severe depending on the amount of damage and which parts of the brain have been affected. Symptoms may begin suddenly or may develop slowly. Mental symptoms of vascular dementia may include:  Confusion.  Memory problems.  Poor attention and concentration.  Trouble understanding speech.  Depression.  Personality changes.  Trouble recognizing familiar people.  Agitation or aggression.  Paranoia.  Delusions or hallucinations. Physical symptoms of vascular dementia may include:  Weakness.  Poor balance.  Loss of bladder or bowel control (incontinence).  Unsteady walking (gait).  Speaking problems. Behavioral symptoms of vascular dementia may include:  Getting lost in familiar places.  Problems with  planning and judgment.  Trouble following instructions.  Social problems.  Emotional outbursts.  Trouble with daily activities and self-care.  Problems handling money. Symptoms may remain stable, or they may get worse over time. Symptoms of vascular dementia may be similar to those of Alzheimer's disease. The two conditions can occur together (mixed dementia). How is this diagnosed? Your health care provider will consider your medical history and symptoms or changes that are reported by friends and family. Your health care provider will do a physical exam and may order lab tests or other tests that check brain and nervous system function. Tests that may be done include:  Blood tests.  Brain imaging tests.  Tests of movement, speech, and other daily activities (neurological exam).  Tests of memory, thinking, and problem-solving (neuropsychological or neurocognitive testing). There is not a specific test to diagnose vascular dementia. Diagnosis may involve several specialists. These may include:  A health care provider who specializes in the brain and nervous system (neurologist).  A health provider who specializes in understanding how problems in the brain can alter behavior and cognitive function (neuropsychologist). How is this treated? There is no cure for vascular dementia. Brain damage that has already occurred cannot be reversed. Treatment depends on:  How severe the condition is.  Which parts of your brain have been affected.  Your overall health. Treatment measures aim to:  Treat the underlying cause of vascular dementia and manage risk factors. This may include: ? Controlling blood pressure. ? Lowering cholesterol. ? Treating diabetes. ? Quitting smoking. ? Losing weight or maintaining a healthy weight. ? Eating a healthy, balanced diet. ? Getting regular exercise.  Manage symptoms.  Prevent further brain damage.  Improve the  person's health and quality of  life. Treatment for dementia may involve a team of health care providers, including:  A neurologist.  A provider who specializes in disorders of the mind (psychiatrist).  A provider who specializes in helping people learn daily living skills (occupational therapist).  A provider who focuses on speech and language changes (Electrical engineer).  A heart specialist (cardiologist).  A provider who helps people learn how to manage physical changes, such as movement and walking (exercise physiologist or physical therapist). Follow these instructions at home: Lifestyle  People with vascular dementia may need regular help at home or daily care from a family member or home health care worker. Home care for a person with vascular dementia depends on what caused the condition and how severe the symptoms are. General guidelines for caregivers include:  Help the person with dementia remember people, appointments, and daily activities.  Help the person with dementia manage his or her medicines.  Help family and friends learn about ways to communicate with the person with dementia.  Create a safe living space to reduce the risk of injury or falls.  Find a support group to help caregivers and family cope with the effects of dementia.  General instructions  Help the person take over-the-counter and prescription medicines only as told by the health care provider.  Follow the health care provider's instructions for treating the condition that caused the dementia.  Make sure the person keeps all follow-up visits as told by the health care provider. This is important. Contact a health care provider if:  A fever develops.  New behavioral problems develop.  Problems with swallowing develop.  Confusion gets worse.  Sleepiness gets worse. Get help right away if:  Loss of consciousness occurs.  There is a sudden loss of speech, balance, or thinking ability.  New numbness or paralysis  occurs.  Sudden, severe headache occurs.  Vision is lost or suddenly gets worse in one or both eyes. Summary  Vascular dementia is a type of dementia. It results from brain damage that is caused by the brain not getting enough blood.  Vascular dementia is caused by conditions that lessen blood flow to the brain. Common causes of this condition include stroke and damage to small blood vessels in the brain.  Treatment focuses on treating the underlying cause of vascular dementia and managing any risk factors.  People with vascular dementia may need regular help at home or daily care from a family member or home health care worker.  Contact a health care provider if you or your caregiver notice any new symptoms. This information is not intended to replace advice given to you by your health care provider. Make sure you discuss any questions you have with your health care provider. Document Revised: 09/18/2017 Document Reviewed: 09/19/2017 Elsevier Patient Education  [place].  Stroke Prevention Some medical conditions and behaviors are associated with a higher chance of having a stroke. You can help prevent a stroke by making nutrition, lifestyle, and other changes, including managing any medical conditions you may have. What nutrition changes can be made?   Eat healthy foods. You can do this by: ? Choosing foods high in fiber, such as fresh fruits and vegetables and whole grains. ? Eating at least 5 or more servings of fruits and vegetables a day. Try to fill half of your plate at each meal with fruits and vegetables. ? Choosing lean protein foods, such as lean cuts of meat, poultry without skin, fish,  tofu, beans, and nuts. ? Eating low-fat dairy products. ? Avoiding foods that are high in salt (sodium). This can help lower blood pressure. ? Avoiding foods that have saturated fat, trans fat, and cholesterol. This can help prevent high cholesterol. ? Avoiding processed and  premade foods.  Follow your health care provider's specific guidelines for losing weight, controlling high blood pressure (hypertension), lowering high cholesterol, and managing diabetes. These may include: ? Reducing your daily calorie intake. ? Limiting your daily sodium intake to 1,500 milligrams (mg). ? Using only healthy fats for cooking, such as olive oil, canola oil, or sunflower oil. ? Counting your daily carbohydrate intake. What lifestyle changes can be made?  Maintain a healthy weight. Talk to your health care provider about your ideal weight.  Get at least 30 minutes of moderate physical activity at least 5 days a week. Moderate activity includes brisk walking, biking, and swimming.  Do not use any products that contain nicotine or tobacco, such as cigarettes and e-cigarettes. If you need help quitting, ask your health care provider. It may also be helpful to avoid exposure to secondhand smoke.  Limit alcohol intake to no more than 1 drink a day for nonpregnant women and 2 drinks a day for men. One drink equals 12 oz of beer, 5 oz of wine, or 1 oz of hard liquor.  Stop any illegal drug use.  Avoid taking birth control pills. Talk to your health care provider about the risks of taking birth control pills if: ? You are over 58 years old. ? You smoke. ? You get migraines. ? You have ever had a blood clot. What other changes can be made?  Manage your cholesterol levels. ? Eating a healthy diet is important for preventing high cholesterol. If cholesterol cannot be managed through diet alone, you may also need to take medicines. ? Take any prescribed medicines to control your cholesterol as told by your health care provider.  Manage your diabetes. ? Eating a healthy diet and exercising regularly are important parts of managing your blood sugar. If your blood sugar cannot be managed through diet and exercise, you may need to take medicines. ? Take any prescribed medicines to  control your diabetes as told by your health care provider.  Control your hypertension. ? To reduce your risk of stroke, try to keep your blood pressure below 130/80. ? Eating a healthy diet and exercising regularly are an important part of controlling your blood pressure. If your blood pressure cannot be managed through diet and exercise, you may need to take medicines. ? Take any prescribed medicines to control hypertension as told by your health care provider. ? Ask your health care provider if you should monitor your blood pressure at home. ? Have your blood pressure checked every year, even if your blood pressure is normal. Blood pressure increases with age and some medical conditions.  Get evaluated for sleep disorders (sleep apnea). Talk to your health care provider about getting a sleep evaluation if you snore a lot or have excessive sleepiness.  Take over-the-counter and prescription medicines only as told by your health care provider. Aspirin or blood thinners (antiplatelets or anticoagulants) may be recommended to reduce your risk of forming blood clots that can lead to stroke.  Make sure that any other medical conditions you have, such as atrial fibrillation or atherosclerosis, are managed. What are the warning signs of a stroke? The warning signs of a stroke can be easily remembered as BEFAST.  B  is for balance. Signs include: ? Dizziness. ? Loss of balance or coordination. ? Sudden trouble walking.  E is for eyes. Signs include: ? A sudden change in vision. ? Trouble seeing.  F is for face. Signs include: ? Sudden weakness or numbness of the face. ? The face or eyelid drooping to one side.  A is for arms. Signs include: ? Sudden weakness or numbness of the arm, usually on one side of the body.  S is for speech. Signs include: ? Trouble speaking (aphasia). ? Trouble understanding.  T is for time. ? These symptoms may represent a serious problem that is an emergency.  Do not wait to see if the symptoms will go away. Get medical help right away. Call your local emergency services (911 in the [place] Do not drive yourself to the hospital.  Other signs of stroke may include: ? A sudden, severe headache with no known cause. ? Nausea or vomiting. ? Seizure. Where to find more information For more information, visit:  American Stroke Association: www.strokeassociation.org  National Stroke Association: www.stroke.org Summary  You can prevent a stroke by eating healthy, exercising, not smoking, limiting alcohol intake, and managing any medical conditions you may have.  Do not use any products that contain nicotine or tobacco, such as cigarettes and e-cigarettes. If you need help quitting, ask your health care provider. It may also be helpful to avoid exposure to secondhand smoke.  Remember BEFAST for warning signs of stroke. Get help right away if you or a loved one has any of these signs. This information is not intended to replace advice given to you by your health care provider. Make sure you discuss any questions you have with your health care provider. Document Revised: 11/30/2016 Document Reviewed: 01/24/2016 Elsevier Patient Education  2020 [employer].

## 2019-10-13 NOTE — Progress Notes (Signed)
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  Patient is here with her husband who also provides information.  Patient feels as though she is doing better.  Husband states that she has been diligent with constant therapy, and ongoing cognitive rehab program for 6 weeks, targeting 40 aerobic minutes per day using a combination of trampoline, elliptical, recumbent combination light weights, yoga balance classes, deep water exercises, 1 therapy, various hand strengthening devices, her dietary regimen was interrupted recently, concerned about her right shoulder area (discussed with primary care), patient believes there is a conspiracy to keep her from resuming to drive and "get better" to mention these items in conference with provoker so husband's gave me a note instead, medications and supplements are unchanged, she is diligent with those protocols and generally upbeat, she is sleeping 7.75+ hours on a regular schedule.  Today we discussed starting a medication possibly Aricept and then going to Namenda, Aricept and Namenda are mostly used for Alzheimer's however there have been studies showing it can help with other types of dementia including vascular dementia.She saw Dr. Rayann Heman, no events, she is following up with him and they are going to follow up with him.   HPI:  Jacqueline Bonilla is a 64 y.o. female here as requested by Horald Pollen, * for "Brain Fog"  PMHx thrombocytopenia, tachycardia, osteoporosis, hypertension, dementia in her mother, depression, anxiety, anemia. Per patient: She had Covid in March. She felt like she had brain fog, cognitive decline, memory loss, she denies balance problems, tingling, balance  problems, she has fallen twice. Tingling in the hands, moves legs a lot but not worse. She feels like she is getting better however. She was working up until [employer] and denies stress, depression or anxiety, no hx of psychiatric problems in life. No FHx of psychiatric issues. Mothr has dementia, she is in her 57s, she started showing signs in her 61s she is in long-term care right now. Unknown type. Generally feel happy. She fell a few times, started falling, 7 weeks ago she was walking and fell, unknown reason, she has fallen other times as well. A few weeks ago she fell again in a park, she states it was very hot both times however she tripped and fell and broke her fall with her elbows and hands, numbness and tingling in the hands and dropping things.   Husband provides most information: 3.5 years ago husband noticed paranoia, she claimed people were conspiring against her, she still believed in conspiracies and neighbor was involved, she said her phone was bugged. 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 No significant short term memory at the time, she still has delusions and husband used to fight with her but now she represses them. Lots of personality changes, antagonistic, beligerant, also short-term memory loss and loss of executive function, she is swearing. A gentleman was helping and she started screaming at him 3 years ago and this is not like her at all she had a big heart and would help people and offer people drinks and this is not like her. Slowly progressive. 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 No alcohol use, no cigarette smoking. Also ataxia, she leans to the right and forward and the right arm and leg flares out. She couldn't  find her way out of pennies. She lost her smell and taste after covid. No rem sleep disorder.   Reviewed notes, labs and imaging from outside physicians, which showed:  TSH 1.4  Review of Systems: Patient complains of symptoms per HPI as well as the following symptoms: memory loss, dementia, behavioral changes . Pertinent negatives and positives per HPI. All others negative    Social History   Socioeconomic History  . Marital status: Married    Spouse name: Not on file  . Number of children: 1  . Years of education: Not on file  . Highest education level: Not on file  Occupational History  . Occupation: accounting  Tobacco Use  . Smoking status: Never Smoker  . Smokeless tobacco: Never Used  Vaping Use  . Vaping Use: Never used  Substance and Sexual Activity  . Alcohol use: Never  . Drug use: Never  . Sexual activity: Not Currently    Partners: Male    Comment: husband vasectomy  Other Topics Concern  . 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 Difficulty of Paying Living Expenses: Not on file  Food Insecurity:   . Worried About Charity fundraiser in the Last Year: Not on file  . Ran Out of Food in the Last Year: Not on file  Transportation Needs:   . Lack of Transportation (Medical): Not on file  . Lack of Transportation (Non-Medical): Not on file  Physical Activity:   . Days of Exercise per Week: Not on file  . Minutes of Exercise per Session: Not on file  Stress:   . Feeling of Stress : Not on file  Social Connections:   . Frequency of Communication with Friends and Family: Not on file  . Frequency of Social Gatherings with Friends and Family: Not on file  . Attends Religious Services: Not on file  . Active Member of  Clubs or Organizations: Not on file  . Attends Archivist Meetings: Not on file  . Marital Status: Not on file  Intimate Partner Violence:   . Fear of Current or Ex-Partner: Not on file  . Emotionally Abused: Not on file  . Physically Abused: Not on file  . Sexually Abused: Not on file    Family History  Problem Relation Age of Onset  . Cancer Father        non hodgkin lymphoma & skin  . Heart attack Maternal Grandfather   . Dementia Mother   . Polymyositis Sister     Past Medical History:  Diagnosis Date  . Anemia   . Anxiety   . Depression   . Dysmenorrhea   . Endometriosis   . Fibroid   . Hypertension   . Microhematuria    negative workup  . Osteoporosis   . Tachycardia   . Thrombocytopenia ([place])  Patient Active Problem List   Diagnosis Date Noted  . Vascular dementia with behavior disturbance ([place]) 10/13/2019  . Vascular dementia without behavioral disturbance ([place]) 07/15/2019  . Cerebrovascular accident ([employer]) 06/25/2019  . Brain fog 04/22/2019  . History of COVID-19 04/22/2019  . Essential hypertension 03/03/2019  . Hypertensive urgency 03/03/2019    Past Surgical History:  Procedure Laterality Date  . BREAST BIOPSY    . CESAREAN SECTION    . hysteroscopic resection      Current Outpatient Medications  Medication Sig Dispense Refill  . aspirin EC 81 MG tablet Take 324 mg by mouth daily. Swallow whole.    . Cholecalciferol (VITAMIN D3 GUMMIES PO) Take 1,000 mcg by mouth daily.    Marland Kitchen losartan (COZAAR) 25 MG tablet Take 1 tablet (25 mg total) by mouth daily. 90 tablet 3  . MAGNESIUM PO Take by mouth as needed.     Marland Kitchen MELATONIN PO Take 10 mg by mouth daily.    . Multiple Vitamins-Minerals (AIRBORNE GUMMIES) CHEW Chew by mouth daily.    . Multiple Vitamins-Minerals (HAIR SKIN AND NAILS FORMULA PO) Take 2,500 mcg by mouth daily.    Marland Kitchen OVER THE COUNTER MEDICATION daily.    . Probiotic Product (PROBIOTIC DAILY PO) Take by mouth daily.    Marland Kitchen UNABLE  TO FIND Med Name: "striction D" (?) for blood pressure      No current facility-administered medications for this visit.    Allergies as of 10/13/2019 - Review Complete 10/13/2019  Allergen Reaction Noted  . Avelox [moxifloxacin]  03/11/2019  . Ciprofloxacin Hives 03/03/2019  . Floraquin [iodoquinol]  07/30/2012  . Iohexol  08/16/2005  . Levaquin [levofloxacin]  03/11/2019  . Quinolones  08/08/2017  . Shellfish allergy Itching 06/25/2019    Vitals: BP 121/78 (BP Location: Right Arm, Patient Position: Sitting)   Pulse 93   Ht 5\' 4"  (1.626 m)   Wt 114 lb (51.7 kg)   LMP 01/02/2003   BMI 19.57 kg/m  Last Weight:  Wt Readings from Last 1 Encounters:  10/13/19 114 lb (51.7 kg)   Last Height:   Ht Readings from Last 1 Encounters:  10/13/19 5\' 4"  (1.626 m)     Neuro: Stable, no changes Detailed Neurologic Exam  Speech:    Speech is normal; fluent and spontaneous with normal comprehension.  Cognition:    MMSE - Mini Mental State Exam 06/11/2019  Orientation to time 5  Orientation to Place 5  Registration 3  Attention/ Calculation 3  Recall 2  Language- name 2 objects 2  Language- repeat 1  Language- follow 3 step command 3  Language- read & follow direction 1  Write a sentence 0  Copy design 0  Total score 25    Cranial Nerves:    The pupils are equal, round, and pinpoint, could not visualize fundi due to small pupils. Visual fields are full to finger confrontation. Extraocular movements are intact. Trigeminal sensation is intact and the muscles of mastication are normal. The face is symmetric. The palate elevates in the midline. Hearing intact. Voice is normal. Shoulder shrug is normal. The tongue has normal motion without fasciculations.   Coordination:    No ataxia or dysmetria  Gait:    Heel-toe and tandem gait with minimal imbalance, good stride and arm swing, not shuffling  Motor Observation:    No asymmetry, no atrophy, and no involuntary movements  noted. Tone:    Increased muscle tone vs paratonia  Posture:    Posture  is normal. normal erect    Strength: Mild prox weakness may be due to poor effort but symmetrical, weak grip, otherwise strength is V/V in the upper and lower limbs.      Sensation: intact to LT     Reflex Exam:  DTR's:    Deep tendon reflexes in the upper and lower extremities are brisk bilaterally, right >> left Toes:  right upgoing, left downgoing    Clonus:    2 beats clonus    Assessment/Plan:  64 y.o. female here as requested by Horald Pollen, * for "Brain Fog"  PMHx thrombocytopenia, tachycardia, osteoporosis, hypertension, dementia in her mother, depression, anxiety, anemia. Per patient: Patient reports symptoms of "brain fog" since March when she had Covid however her husband today says it goes back longer than that, approximately 3.5 years.  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 Rayann Heman. Also moderste to extensive white matter changes.  -  Today we discussed starting a medication possibly Aricept and then going to Namenda, Aricept and Namenda are mostly used for Alzheimer's however there have been studies showing it can help with other types of dementia including vascular dementia. Patient declines any medications.   - Follow up with Janett Billow for strokes and vascular dementia. 6 months. May continue to follow with NP or be sent back to primary care if stable.   - I had a long d/w patient and husband about stroke, risk for recurrent stroke/TIAs, personally independently reviewed imaging studies and stroke evaluation results and answered questions.Continue ASA for secondary stroke prevention and maintain strict  control of hypertension with blood pressure goal below 130/90, diabetes with hemoglobin A1c goal below 6.5% and lipids with LDL cholesterol goal below 70 mg/dL. I also advised the patient to eat a healthy diet with plenty of whole grains, cereals, fruits and vegetables, exercise regularly and maintain ideal body weight.  Cc: Horald Pollen, *,    Sarina Ill, MD  [facility] [address] [place], [place] [address]  Phone [phone] Fax [phone]  I spent 30 minutes of face-to-face and non-face-to-face time with patient on the  1. Vascular dementia with behavior disturbance ([place])    diagnosis.  This included previsit chart review, lab review, study review, order entry, electronic health record documentation, patient education on the different diagnostic and therapeutic options, counseling and coordination of care, risks and benefits of management, compliance, or risk factor reduction

## 2019-10-20 ENCOUNTER — Encounter: Payer: Self-pay | Admitting: Emergency Medicine

## 2019-10-20 DIAGNOSIS — R531 Weakness: Secondary | ICD-10-CM | POA: Diagnosis not present

## 2019-10-20 DIAGNOSIS — R419 Unspecified symptoms and signs involving cognitive functions and awareness: Secondary | ICD-10-CM | POA: Diagnosis not present

## 2019-10-20 DIAGNOSIS — R202 Paresthesia of skin: Secondary | ICD-10-CM | POA: Diagnosis not present

## 2019-10-20 DIAGNOSIS — R278 Other lack of coordination: Secondary | ICD-10-CM | POA: Diagnosis not present

## 2019-10-20 NOTE — Telephone Encounter (Signed)
Thanks but not sure if there is a question here.

## 2019-10-21 ENCOUNTER — Ambulatory Visit: Payer: BC Managed Care – PPO | Admitting: Internal Medicine

## 2019-10-21 ENCOUNTER — Encounter: Payer: Self-pay | Admitting: Internal Medicine

## 2019-10-21 ENCOUNTER — Other Ambulatory Visit: Payer: Self-pay

## 2019-10-21 VITALS — BP 126/90 | HR 77 | Ht 64.0 in | Wt 114.0 lb

## 2019-10-21 DIAGNOSIS — I10 Essential (primary) hypertension: Secondary | ICD-10-CM | POA: Diagnosis not present

## 2019-10-21 DIAGNOSIS — I639 Cerebral infarction, unspecified: Secondary | ICD-10-CM

## 2019-10-21 HISTORY — PX: OTHER SURGICAL HISTORY: SHX169

## 2019-10-21 NOTE — Progress Notes (Signed)
PCP: Horald Pollen, MD   Primary EP: Dr Katina Degree is a 64 y.o. female who presents today for routine electrophysiology followup.  Since last being seen in our clinic, the patient reports doing very well.  She continues to have confusion and labile mood. She wore an event monitor which did not reveal afib.  She did have nonsustained atach.  Today, she denies symptoms of palpitations, chest pain, shortness of breath,  lower extremity edema, dizziness, presyncope, or syncope.  The patient is otherwise without complaint today.   Past Medical History:  Diagnosis Date  . Anemia   . Anxiety   . Depression   . Dysmenorrhea   . Endometriosis   . Fibroid   . Hypertension   . Microhematuria    negative workup  . Osteoporosis   . Tachycardia   . Thrombocytopenia ([place])    Past Surgical History:  Procedure Laterality Date  . BREAST BIOPSY    . CESAREAN SECTION    . hysteroscopic resection      ROS- all systems are reviewed and negatives except as per HPI above  Current Outpatient Medications  Medication Sig Dispense Refill  . aspirin EC 81 MG tablet Take 324 mg by mouth daily. Swallow whole.    . Cholecalciferol (VITAMIN D3 GUMMIES PO) Take 1,000 mcg by mouth daily.    Marland Kitchen MAGNESIUM PO Take by mouth as needed.     Marland Kitchen MELATONIN PO Take 10 mg by mouth daily.    . Multiple Vitamins-Minerals (AIRBORNE GUMMIES) CHEW Chew by mouth daily.    . Multiple Vitamins-Minerals (HAIR SKIN AND NAILS FORMULA PO) Take 2,500 mcg by mouth daily.    Marland Kitchen OVER THE COUNTER MEDICATION daily.    . Probiotic Product (PROBIOTIC DAILY PO) Take by mouth daily.    Marland Kitchen UNABLE TO FIND Med Name: "striction D" (?) for blood pressure     . losartan (COZAAR) 25 MG tablet Take 1 tablet (25 mg total) by mouth daily. 90 tablet 3   No current facility-administered medications for this visit.    Physical Exam: Vitals:   10/21/19 1057  BP: 126/90  Pulse: 77  SpO2: 97%  Weight: 114 lb (51.7 kg)    Height: 5\' 4"  (1.626 m)    GEN- The patient is well appearing, alert and oriented x 3 today.  Somewhat agitated with history Head- normocephalic, atraumatic Eyes-  Sclera clear, conjunctiva pink Ears- hearing intact Oropharynx- clear Lungs- Clear to ausculation bilaterally, normal work of breathing Heart- Regular rate and rhythm, no murmurs, rubs or gallops, PMI not laterally displaced GI- soft, NT, ND, + BS Extremities- no clubbing, cyanosis, or edema  Wt Readings from Last 3 Encounters:  10/21/19 114 lb (51.7 kg)  10/13/19 114 lb (51.7 kg)  08/20/19 113 lb 9.6 oz (51.5 kg)    EKG tracing ordered today is personally reviewed and shows sinus rhythm Event monitor is reviewed and reveals sinus rhythm  Assessment and Plan:  1. Embolic stroke of unknown source Dr Jaynee Eagles and I share concern for afib as the cause. She has worn a 30 day monitor which was unrevealing. I would therefore advise implantation of an implantable loop recorder for long term arrhythmia monitoring.  Risks and benefits to ILR were discussed at length with the patient today, including but not limited to risks of bleeding and infection.  Extensive device education was performed.  Remote monitoring was also discussed at length today.  The patient understands and wishes to  proceed.  We will proceed at this time with ILR implantation.  2. HTN Stable No change required today  Thompson Grayer MD, [facility] 10/21/2019 11:25 AM      DESCRIPTION OF PROCEDURE:  Informed written consent was obtained.  The patient required no sedation for the procedure today.  The patients left chest was prepped and draped. Mapping over the patient's chest was performed to identify the appropriate ILR site.  This area was found to be the left parasternal region over the 3rd-4th intercostal space.  The skin overlying this region was infiltrated with lidocaine for local analgesia.  A 0.5-cm incision was made at the implant site.  A subcutaneous ILR  pocket was fashioned using a combination of sharp and blunt dissection.  A Medtronic Reveal Linq model LNQ 22 (SN [id number]) implantable loop recorder was then placed into the pocket R waves were very prominent and measured > 0.2 mV. EBL<1 ml.  Steri- Strips and a sterile dressing were then applied.  There were no early apparent complications.     CONCLUSIONS:   1. Successful implantation of a Medtronic Reveal LINQ implantable loop recorder for cryptogenic stroke  2. No early apparent complications.   Thompson Grayer MD, [facility] 10/21/2019 11:25 AM

## 2019-10-21 NOTE — Patient Instructions (Signed)
Medication Instructions:  Your physician recommends that you continue on your current medications as directed. Please refer to the Current Medication list given to you today.  Labwork: None ordered.  Testing/Procedures: None ordered.  Your physician wants you to follow-up in: as needed with Dr. Rayann Heman.  You will receive a reminder letter in the mail two months in advance. If you don't receive a letter, please call our office to schedule the follow-up appointment.    Implantable Loop Recorder Placement, Care After This sheet gives you information about how to care for yourself after your procedure. Your health care provider may also give you more specific instructions. If you have problems or questions, contact your health care provider. What can I expect after the procedure? After the procedure, it is common to have:  Soreness or discomfort near the incision.  Some swelling or bruising near the incision.  Follow these instructions at home: Incision care  1.  Leave your outer dressing on for 24 hours.  After 24 hours you can remove your outer dressing and shower. 2. Leave adhesive strips in place. These skin closures may need to stay in place for 1-2 weeks. If adhesive strip edges start to loosen and curl up, you may trim the loose edges.  You may remove the strips if they have not fallen off after 2 weeks. 3. Check your incision area every day for signs of infection. Check for: a. Redness, swelling, or pain. b. Fluid or blood. c. Warmth. d. Pus or a bad smell. 4. Do not take baths, swim, or use a hot tub until your incision is completely healed. 5. If your wound site starts to bleed apply pressure.      If you have any questions/concerns please call the device clinic at [phone].  Activity  Return to your normal activities.  General instructions  Follow instructions from your health care provider about how to manage your implantable loop recorder and transmit the  information. Learn how to activate a recording if this is necessary for your type of device.  Do not go through a metal detection gate, and do not let someone hold a metal detector over your chest. Show your ID card.  Do not have an MRI unless you check with your health care provider first.  Take over-the-counter and prescription medicines only as told by your health care provider.  Keep all follow-up visits as told by your health care provider. This is important. Contact a health care provider if:  You have redness, swelling, or pain around your incision.  You have a fever.  You have pain that is not relieved by your pain medicine.  You have triggered your device because of fainting (syncope) or because of a heartbeat that feels like it is racing, slow, fluttering, or skipping (palpitations). Get help right away if you have:  Chest pain.  Difficulty breathing. Summary  After the procedure, it is common to have soreness or discomfort near the incision.  Change your dressing as told by your health care provider.  Follow instructions from your health care provider about how to manage your implantable loop recorder and transmit the information.  Keep all follow-up visits as told by your health care provider. This is important. This information is not intended to replace advice given to you by your health care provider. Make sure you discuss any questions you have with your health care provider. Document Released: 11/29/2014 Document Revised: 02/02/2017 Document Reviewed: 02/02/2017 Elsevier Patient Education  2020 [employer].

## 2019-10-22 NOTE — Telephone Encounter (Signed)
It sounds as if he would like you to consult with her and prescribe the antipsychotic as she trusts you most and is hopping this will help her mental status as she is becoming paranoid. Please advise

## 2019-10-22 NOTE — Telephone Encounter (Signed)
Antipsychotics are not something I feel comfortable prescribing nor do I have much experience with them.  I recommend they talk to [name] office regarding this situation.  This is a psychiatric/neurologic problem outside my field of expertise and should be evaluated by the proper specialist.

## 2019-11-11 DIAGNOSIS — Z1211 Encounter for screening for malignant neoplasm of colon: Secondary | ICD-10-CM | POA: Diagnosis not present

## 2019-11-16 ENCOUNTER — Other Ambulatory Visit: Payer: Self-pay | Admitting: Emergency Medicine

## 2019-11-21 LAB — COLOGUARD: COLOGUARD: NEGATIVE

## 2019-11-24 ENCOUNTER — Ambulatory Visit (INDEPENDENT_AMBULATORY_CARE_PROVIDER_SITE_OTHER): Payer: BC Managed Care – PPO

## 2019-11-24 DIAGNOSIS — I6349 Cerebral infarction due to embolism of other cerebral artery: Secondary | ICD-10-CM | POA: Diagnosis not present

## 2019-11-24 LAB — CUP PACEART REMOTE DEVICE CHECK
Date Time Interrogation Session: 20211122190404
Implantable Pulse Generator Implant Date: 20211020

## 2019-11-24 LAB — COLOGUARD: Cologuard: NEGATIVE

## 2019-11-30 NOTE — Progress Notes (Signed)
Carelink Summary Report / Loop Recorder 

## 2019-12-03 ENCOUNTER — Encounter: Payer: Self-pay | Admitting: Emergency Medicine

## 2019-12-03 ENCOUNTER — Ambulatory Visit (INDEPENDENT_AMBULATORY_CARE_PROVIDER_SITE_OTHER): Payer: BC Managed Care – PPO

## 2019-12-03 ENCOUNTER — Other Ambulatory Visit: Payer: Self-pay

## 2019-12-03 ENCOUNTER — Ambulatory Visit: Payer: BC Managed Care – PPO | Admitting: Emergency Medicine

## 2019-12-03 VITALS — BP 156/82 | HR 75 | Temp 98.1°F | Resp 16 | Ht 64.0 in | Wt 111.0 lb

## 2019-12-03 DIAGNOSIS — M25511 Pain in right shoulder: Secondary | ICD-10-CM | POA: Diagnosis not present

## 2019-12-03 DIAGNOSIS — I10 Essential (primary) hypertension: Secondary | ICD-10-CM | POA: Diagnosis not present

## 2019-12-03 DIAGNOSIS — F0150 Vascular dementia without behavioral disturbance: Secondary | ICD-10-CM | POA: Diagnosis not present

## 2019-12-03 IMAGING — DX DG SHOULDER 2+V*R*
3 series · 3 of 3 positions shown · non-contrast
Comparison: None.

CLINICAL DATA: Pain

EXAM:
RIGHT SHOULDER - 2+ VIEW

[shoulder ap]
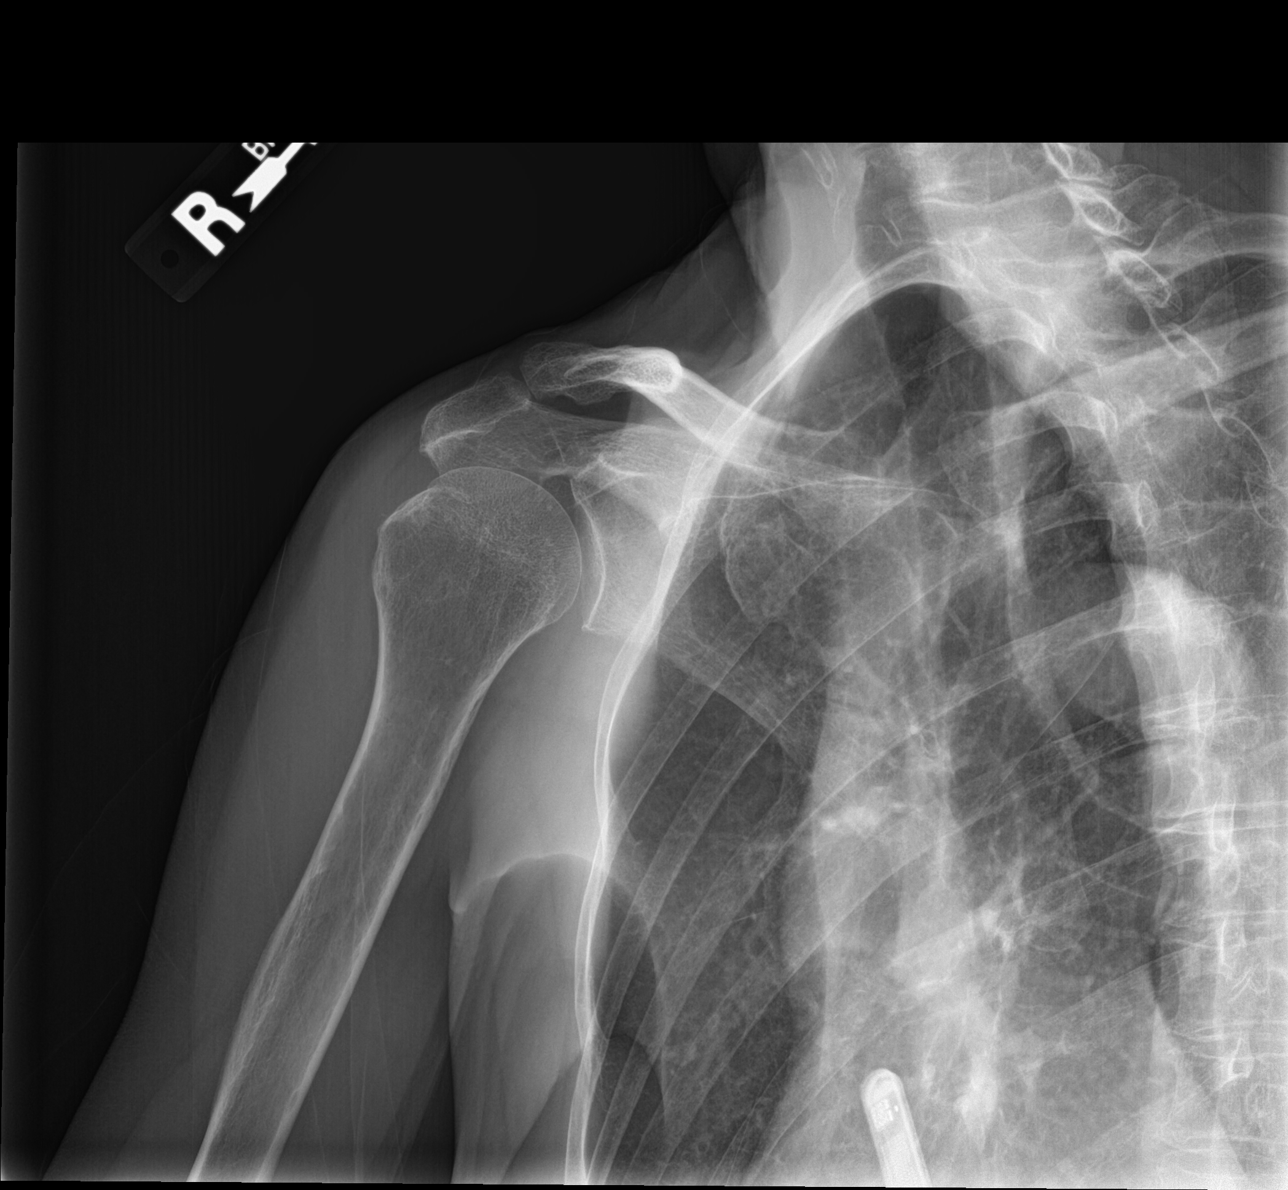

[shoulder y-view]
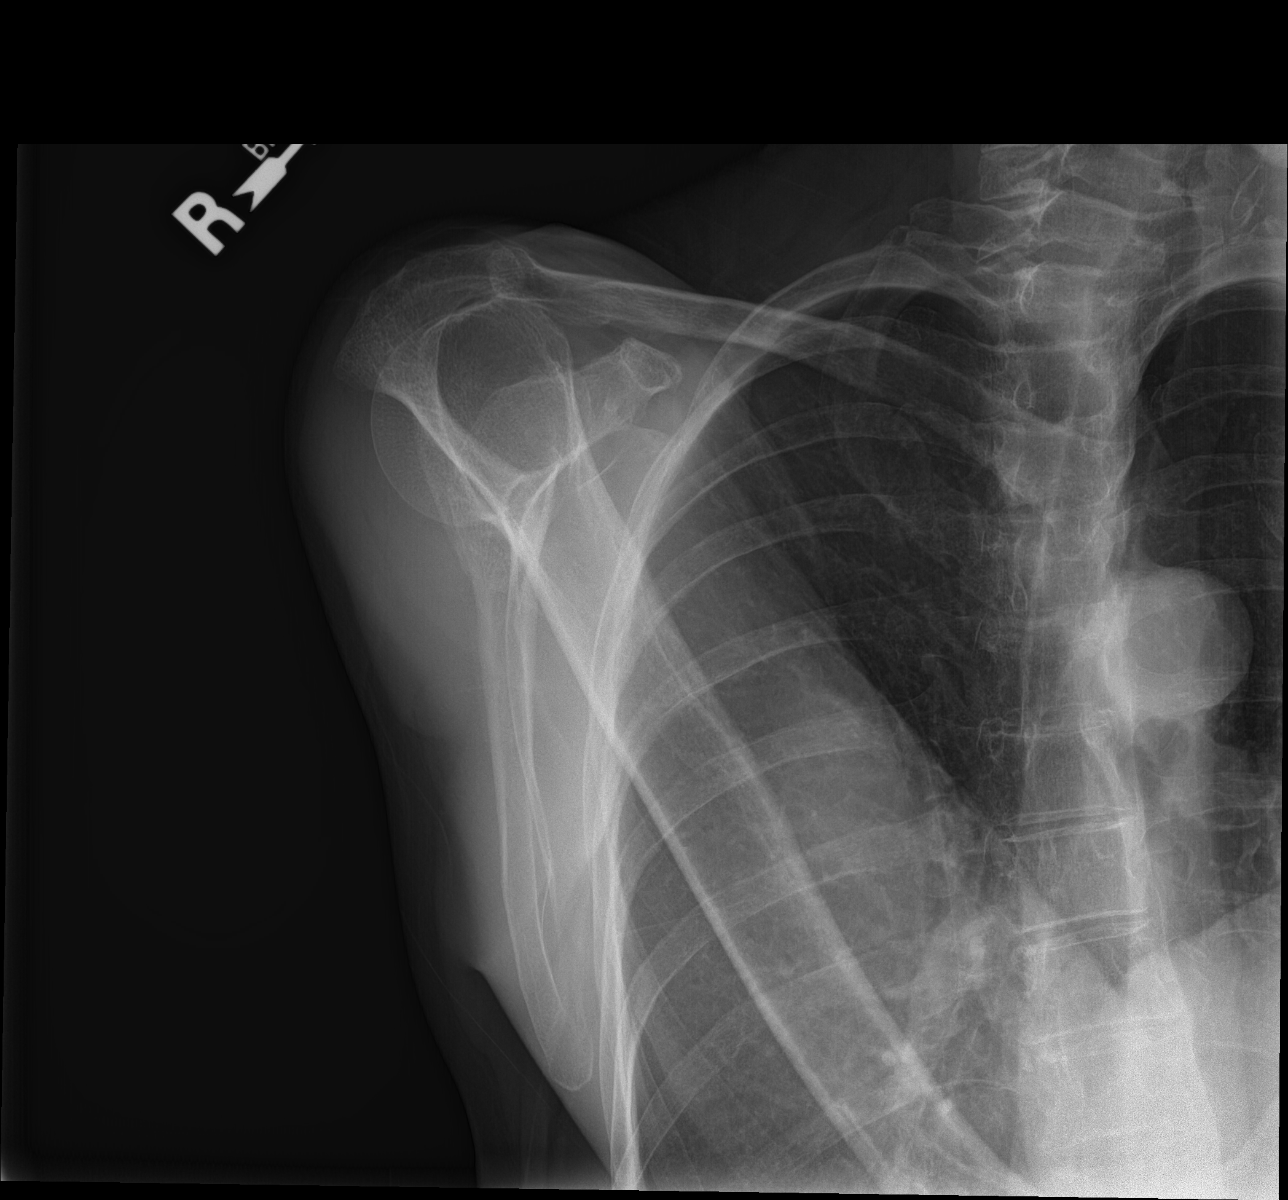

[shoulder axial]
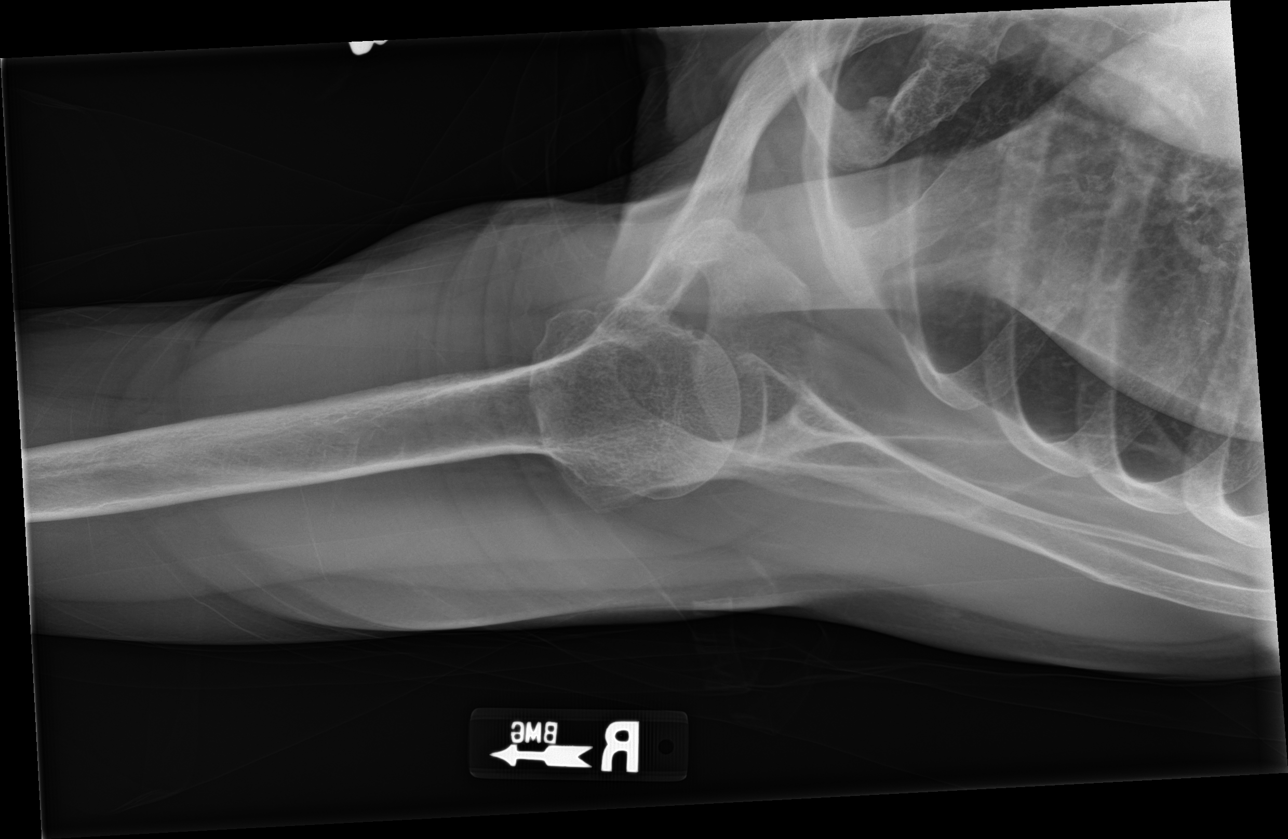

[3 of 3 positions shown; findings below may reference images not displayed]

FINDINGS: Oblique, Y scapular, and axillary images were obtained. There is no
fracture or dislocation. There is slight narrowing of the
acromioclavicular joint. The glenohumeral joint appears normal. No
erosive change. Visualized right lung clear.
IMPRESSION: Slight narrowing acromioclavicular joint. No fracture or
dislocation. No erosion.

## 2019-12-03 NOTE — Patient Instructions (Addendum)
   If you have lab work done today you will be contacted with your lab results within the next 2 weeks.  If you have not heard from us then please contact us. The fastest way to get your results is to register for My Chart.   IF you received an x-ray today, you will receive an invoice from [facility]. Please contact [facility] at [phone] with questions or concerns regarding your invoice.   IF you received labwork today, you will receive an invoice from [facility]. Please contact [facility] at [phone] with questions or concerns regarding your invoice.   Our billing staff will not be able to assist you with questions regarding bills from these companies.  You will be contacted with the lab results as soon as they are available. The fastest way to get your results is to activate your My Chart account. Instructions are located on the last page of this paperwork. If you have not heard from us regarding the results in 2 weeks, please contact this office.     Shoulder Pain Many things can cause shoulder pain, including:  An injury.  Moving the shoulder in the same way again and again (overuse).  Joint pain (arthritis). Pain can come from:  Swelling and irritation (inflammation) of any part of the shoulder.  An injury to the shoulder joint.  An injury to: ? Tissues that connect muscle to bone (tendons). ? Tissues that connect bones to each other (ligaments). ? Bones. Follow these instructions at home: Watch for changes in your symptoms. Let your doctor know about them. Follow these instructions to help with your pain. If you have a sling:  Wear the sling as told by your doctor. Remove it only as told by your doctor.  Loosen the sling if your fingers: ? Tingle. ? Become numb. ? Turn cold and blue.  Keep the sling clean.  If the sling is not waterproof: ? Do not let it get wet. ? Take the sling off when you shower or bathe. Managing pain, stiffness,  and swelling   If told, put ice on the painful area: ? Put ice in a plastic bag. ? Place a towel between your skin and the bag. ? Leave the ice on for 20 minutes, 2-3 times a day. Stop putting ice on if it does not help with the pain.  Squeeze a soft ball or a foam pad as much as possible. This prevents swelling in the shoulder. It also helps to strengthen the arm. General instructions  Take over-the-counter and prescription medicines only as told by your doctor.  Keep all follow-up visits as told by your doctor. This is important. Contact a doctor if:  Your pain gets worse.  Medicine does not help your pain.  You have new pain in your arm, hand, or fingers. Get help right away if:  Your arm, hand, or fingers: ? Tingle. ? Are numb. ? Are swollen. ? Are painful. ? Turn white or blue. Summary  Shoulder pain can be caused by many things. These include injury, moving the shoulder in the same away again and again, and joint pain.  Watch for changes in your symptoms. Let your doctor know about them.  This condition may be treated with a sling, ice, and pain medicine.  Contact your doctor if the pain gets worse or you have new pain. Get help right away if your arm, hand, or fingers tingle or get numb, swollen, or painful.  Keep all follow-up visits   as told by your doctor. This is important. This information is not intended to replace advice given to you by your health care provider. Make sure you discuss any questions you have with your health care provider. Document Revised: 07/02/2017 Document Reviewed: 07/02/2017 Elsevier Patient Education  2020 Elsevier Inc.  

## 2019-12-03 NOTE — Progress Notes (Signed)
Jacqueline Bonilla 64 y.o.   Chief Complaint  Patient presents with  . Shoulder Pain    right with numbness of the right hand since the fall in May    HISTORY OF PRESENT ILLNESS: This is a 64 y.o. female complaining of right shoulder pain since fall last May.  Also has some numbness to the right hand associated with this.  No other injuries or significant symptomatology.  HPI   Prior to Admission medications   Medication Sig Start Date End Date Taking? Authorizing Provider  aspirin EC 81 MG tablet Take 324 mg by mouth daily. Swallow whole.   Yes [provider]  Cholecalciferol (VITAMIN D3 GUMMIES PO) Take 1,000 mcg by mouth daily.   Yes [provider]  MAGNESIUM PO Take by mouth as needed.    Yes [provider]  MELATONIN PO Take 10 mg by mouth daily.   Yes [provider]  Multiple Vitamins-Minerals (AIRBORNE GUMMIES) CHEW Chew by mouth daily.   Yes [provider]  Multiple Vitamins-Minerals (HAIR SKIN AND NAILS FORMULA PO) Take 2,500 mcg by mouth daily.   Yes [provider]  OVER THE COUNTER MEDICATION daily.   Yes [provider]  Probiotic Product (PROBIOTIC DAILY PO) Take by mouth daily.   Yes [provider]  UNABLE TO FIND Med Name: "striction D" (?) for blood pressure    Yes [provider]  losartan (COZAAR) 25 MG tablet Take 1 tablet (25 mg total) by mouth daily. 07/20/19 10/18/19  Horald Pollen, MD    Allergies  Allergen Reactions  . Avelox [Moxifloxacin]   . Ciprofloxacin Hives  . Floraquin [Iodoquinol]     No cipro or others  . Iohexol      Code: HIVES, Desc: HIVES S/P IVP MANY YRS AGO...NO PROBLEM W/O PREP TODAY/A.CALHOUN, Onset Date: [id number]   . Levaquin [Levofloxacin]   . Quinolones   . Shellfish Allergy Itching    Itching under the chin. Described by patient but not seen by husband.    Patient Active Problem List   Diagnosis Date Noted  . Vascular dementia with  behavior disturbance ([place]) 10/13/2019  . Vascular dementia without behavioral disturbance ([place]) 07/15/2019  . Cerebrovascular accident ([place]) 06/25/2019  . Brain fog 04/22/2019  . History of COVID-19 04/22/2019  . Essential hypertension 03/03/2019  . Hypertensive urgency 03/03/2019    Past Medical History:  Diagnosis Date  . Anemia   . Anxiety   . Depression   . Dysmenorrhea   . Endometriosis   . Fibroid   . Hypertension   . Microhematuria    negative workup  . Osteoporosis   . Tachycardia   . Thrombocytopenia ([place])     Past Surgical History:  Procedure Laterality Date  . BREAST BIOPSY    . CESAREAN SECTION    . hysteroscopic resection    . implantable loop recorder implant  10/21/2019   Medtronic Reveal Linq model LNQ 22 ([place] [id number]) implantable loop recorder    Social History   Socioeconomic History  . Marital status: Married    Spouse name: Not on file  . Number of children: 1  . Years of education: Not on file  . Highest education level: Not on file  Occupational History  . Occupation: accounting  Tobacco Use  . Smoking status: Never Smoker  . Smokeless tobacco: Never Used  Vaping Use  . Vaping Use: Never used  Substance and Sexual Activity  . Alcohol use: Never  .  Drug use: Never  . Sexual activity: Not Currently    Partners: Male    Comment: husband vasectomy  Other Topics Concern  . 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 Difficulty of Paying Living Expenses: Not on file  Food Insecurity:   . Worried About Charity fundraiser in the Last Year: Not on file  . Ran Out of Food in the Last Year: Not on file  Transportation Needs:   . Lack of Transportation (Medical): Not on file  . Lack of Transportation  (Non-Medical): Not on file  Physical Activity:   . Days of Exercise per Week: Not on file  . Minutes of Exercise per Session: Not on file  Stress:   . Feeling of Stress : Not on file  Social Connections:   . Frequency of Communication with Friends and Family: Not on file  . Frequency of Social Gatherings with Friends and Family: Not on file  . Attends Religious Services: Not on file  . Active Member of Clubs or Organizations: Not on file  . Attends Archivist Meetings: Not on file  . Marital Status: Not on file  Intimate Partner Violence:   . Fear of Current or Ex-Partner: Not on file  . Emotionally Abused: Not on file  . Physically Abused: Not on file  . Sexually Abused: Not on file    Family History  Problem Relation Age of Onset  . Cancer Father        non hodgkin lymphoma & skin  . Heart attack Maternal Grandfather   . Dementia Mother   . Polymyositis Sister      Review of Systems  Constitutional: Negative.  Negative for chills and fever.  HENT: Negative.  Negative for congestion and sore throat.   Respiratory: Negative.  Negative for cough and shortness of breath.   Cardiovascular: Negative.  Negative for chest pain and palpitations.  Gastrointestinal: Negative.  Negative for abdominal pain, diarrhea, nausea and vomiting.  Genitourinary: Negative.  Negative for dysuria.  Skin: Negative.  Negative for rash.  Neurological: Negative.  Negative for dizziness and headaches.  All other systems reviewed and are negative.  Today's Vitals   12/03/19 0814  BP: (!) 156/82  Pulse: 75  Resp: 16  Temp: 98.1 F (36.7 C)  TempSrc: Temporal  SpO2: 99%  Weight: 111 lb (50.3 kg)  Height: 5\' 4"  (1.626 m)   Body mass index is 19.05 kg/m.   Physical Exam Vitals reviewed.  Constitutional:      Appearance: Normal appearance.  HENT:     Head: Normocephalic.  Eyes:     Extraocular Movements: Extraocular movements intact.     Pupils: Pupils are equal, round, and  reactive to light.  Cardiovascular:     Rate and Rhythm: Normal rate and regular rhythm.     Pulses: Normal pulses.     Heart sounds: Normal heart sounds.  Pulmonary:     Effort: Pulmonary effort is normal.     Breath sounds: Normal breath sounds.  Musculoskeletal:     Cervical back: Normal range of motion.     Comments: Right shoulder: No  erythema, swelling or tenderness to palpation.  Full range of motion. Right upper extremity: Neurovascularly intact.  Good distal pulses.  Good sensation.  Full range of motion.  Skin:    General: Skin is warm and dry.     Capillary Refill: Capillary refill takes less than 2 seconds.  Neurological:     General: No focal deficit present.     Mental Status: She is alert and oriented to person, place, and time.  Psychiatric:        Mood and Affect: Mood normal.        Behavior: Behavior normal.    DG Shoulder Right  Result Date: 12/03/2019 CLINICAL DATA:  Pain EXAM: RIGHT SHOULDER - 2+ VIEW COMPARISON:  None. FINDINGS: Oblique, Y scapular, and axillary images were obtained. There is no fracture or dislocation. There is slight narrowing of the acromioclavicular joint. The glenohumeral joint appears normal. No erosive change. Visualized right lung clear. IMPRESSION: Slight narrowing acromioclavicular joint. No fracture or dislocation. No erosion. Electronically Signed   By: Lowella Grip III M.D.   On: 12/03/2019 08:39     ASSESSMENT & PLAN: Pennie was seen today for shoulder pain.  Diagnoses and all orders for this visit:  Right shoulder pain, unspecified chronicity -     DG Shoulder Right  Essential hypertension  Vascular dementia without behavioral disturbance [facility])    Patient Instructions       If you have lab work done today you will be contacted with your lab results within the next 2 weeks.  If you have not heard from [name] then please contact us. The fastest way to get your results is to register for My Chart.   IF you  received an x-ray today, you will receive an invoice from [facility]. Please contact [facility] at [phone] with questions or concerns regarding your invoice.   IF you received labwork today, you will receive an invoice from [facility]. Please contact [facility] at [phone] with questions or concerns regarding your invoice.   Our billing staff will not be able to assist you with questions regarding bills from these companies.  You will be contacted with the lab results as soon as they are available. The fastest way to get your results is to activate your My Chart account. Instructions are located on the last page of this paperwork. If you have not heard from [name] regarding the results in 2 weeks, please contact this office.     Shoulder Pain Many things can cause shoulder pain, including:  An injury.  Moving the shoulder in the same way again and again (overuse).  Joint pain (arthritis). Pain can come from:  Swelling and irritation (inflammation) of any part of the shoulder.  An injury to the shoulder joint.  An injury to: ? Tissues that connect muscle to bone (tendons). ? Tissues that connect bones to each other (ligaments). ? Bones. Follow these instructions at home: Watch for changes in your symptoms. Let your doctor know about them. Follow these instructions to help with your pain. If you have a sling:  Wear the sling as told by your doctor. Remove it only as told by your doctor.  Loosen the sling if your fingers: ? Tingle. ? Become numb. ? Turn cold and blue.  Keep the sling clean.  If the sling is not waterproof: ? Do not let it get wet. ? Take the sling off when you shower or bathe. Managing pain, stiffness, and swelling   If told, put  ice on the painful area: ? Put ice in a plastic bag. ? Place a towel between your skin and the bag. ? Leave the ice on for 20 minutes, 2-3 times a day. Stop putting ice on if it does not help with the  pain.  Squeeze a soft ball or a foam pad as much as possible. This prevents swelling in the shoulder. It also helps to strengthen the arm. General instructions  Take over-the-counter and prescription medicines only as told by your doctor.  Keep all follow-up visits as told by your doctor. This is important. Contact a doctor if:  Your pain gets worse.  Medicine does not help your pain.  You have new pain in your arm, hand, or fingers. Get help right away if:  Your arm, hand, or fingers: ? Tingle. ? Are numb. ? Are swollen. ? Are painful. ? Turn white or blue. Summary  Shoulder pain can be caused by many things. These include injury, moving the shoulder in the same away again and again, and joint pain.  Watch for changes in your symptoms. Let your doctor know about them.  This condition may be treated with a sling, ice, and pain medicine.  Contact your doctor if the pain gets worse or you have new pain. Get help right away if your arm, hand, or fingers tingle or get numb, swollen, or painful.  Keep all follow-up visits as told by your doctor. This is important. This information is not intended to replace advice given to you by your health care provider. Make sure you discuss any questions you have with your health care provider. Document Revised: 07/02/2017 Document Reviewed: 07/02/2017 Elsevier Patient Education  2020 Elsevier Inc.      Agustina Caroli, MD [facility]

## 2019-12-28 ENCOUNTER — Ambulatory Visit (INDEPENDENT_AMBULATORY_CARE_PROVIDER_SITE_OTHER): Payer: BC Managed Care – PPO

## 2019-12-28 DIAGNOSIS — I6349 Cerebral infarction due to embolism of other cerebral artery: Secondary | ICD-10-CM | POA: Diagnosis not present

## 2019-12-28 LAB — CUP PACEART REMOTE DEVICE CHECK
Date Time Interrogation Session: 20211225190305
Implantable Pulse Generator Implant Date: 20211020

## 2020-01-07 NOTE — Progress Notes (Signed)
Carelink Summary Report / Loop Recorder 

## 2020-01-17 ENCOUNTER — Encounter: Payer: Self-pay | Admitting: Emergency Medicine

## 2020-01-18 ENCOUNTER — Telehealth (INDEPENDENT_AMBULATORY_CARE_PROVIDER_SITE_OTHER): Payer: BC Managed Care – PPO | Admitting: Emergency Medicine

## 2020-01-18 ENCOUNTER — Other Ambulatory Visit: Payer: Self-pay

## 2020-01-18 ENCOUNTER — Encounter: Payer: Self-pay | Admitting: Emergency Medicine

## 2020-01-18 VITALS — Ht 64.0 in | Wt 116.0 lb

## 2020-01-18 DIAGNOSIS — I10 Essential (primary) hypertension: Secondary | ICD-10-CM

## 2020-01-18 DIAGNOSIS — F0150 Vascular dementia without behavioral disturbance: Secondary | ICD-10-CM

## 2020-01-18 NOTE — Progress Notes (Signed)
Telemedicine Encounter- SOAP NOTE Established Patient Patient: Home  Provider: Office     This telephone encounter was conducted with the patient's (or proxy's) verbal consent via audio telecommunications: yes Patient was instructed to have this encounter in a suitably private space; and to only have persons present to whom they give permission to participate. In addition, patient identity was confirmed by use of name plus two identifiers (DOB and address).  I discussed the limitations, risks, security and privacy concerns of performing an evaluation and management service by telephone and the availability of in person appointments. I also discussed with the patient that there may be a patient responsible charge related to this service. The patient expressed understanding and agreed to proceed.  I spent a total of 20 minutes talking with the patient or their proxy.  Chief Complaint  Patient presents with  . Medical Management of Chronic Issues    6 m f/u and pt reports that she is doing well. She excising and feeling better. No refills needed at this time.     Subjective   Jacqueline Bonilla is a 65 y.o. female established patient. Telephone visit today for follow-up of chronic medical problems. [name] main problem is vascular dementia with behavioral disturbance.  As per husband's comments she is doing well with tremendous progress.  He wrote the following: [name]'s bp is monitored 3 times weekly. 110/70 (+-5) is constant. She is sleeping 8 hrs + and weighs 117 lbs. Her behavioral episodes are infrequent and are handled successfully with dialogue. She is optimistic. 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 She is remembering routes and items better with rigorous drills. She hasn't touched the piano yet. She is doing 240 minutes of aerobic weekly including elliptical, rowing, trampoline, jumping rope, brisk walking, water  exercises and  machines. She works with two trainers and attends balance classes. She does 15 minutes of shoulder and arm isolation exercises daily for her dominant side deficits. My family and her family are heavily engaged in her recovery.  We appreciate everything that you and your staff have done for [name].  We thank God for bringing you into our lives.  [name] I spoke to both Eunice Blase and her husband today.  She is in good spirits without any complaints or medical concerns today.  HPI   Patient Active Problem List   Diagnosis Date Noted  . Vascular dementia with behavior disturbance ([facility]) 10/13/2019  . Vascular dementia without behavioral disturbance ([facility]) 07/15/2019  . Cerebrovascular accident ([facility]) 06/25/2019  . Brain fog 04/22/2019  . History of COVID-19 04/22/2019  . Essential hypertension 03/03/2019  . Hypertensive urgency 03/03/2019    Past Medical History:  Diagnosis Date  . Anemia   . Anxiety   . Depression   . Dysmenorrhea   . Endometriosis   . Fibroid   . Hypertension   . Microhematuria    negative workup  . Osteoporosis   . Tachycardia   . Thrombocytopenia ([facility])     Current Outpatient Medications  Medication Sig Dispense Refill  . aspirin EC 81 MG tablet Take 324 mg by mouth daily. Swallow whole.    . Cholecalciferol (VITAMIN D3 GUMMIES PO) Take 1,000 mcg by mouth daily.    Marland Kitchen MAGNESIUM PO Take by mouth as needed.     Marland Kitchen MELATONIN PO Take 10 mg by mouth daily.    . Multiple Vitamins-Minerals (AIRBORNE GUMMIES) CHEW Chew by mouth  daily.    . Multiple Vitamins-Minerals (HAIR SKIN AND NAILS FORMULA PO) Take 2,500 mcg by mouth daily.    Marland Kitchen OVER THE COUNTER MEDICATION daily.    . Probiotic Product (PROBIOTIC DAILY PO) Take by mouth daily.    Marland Kitchen UNABLE TO FIND Med Name: "striction D" (?) for blood pressure     . losartan (COZAAR) 25 MG tablet Take 1 tablet (25 mg total) by mouth daily. 90 tablet 3   No current facility-administered medications for this visit.     Allergies  Allergen Reactions  . Avelox [Moxifloxacin]   . Ciprofloxacin Hives  . Floraquin [Iodoquinol]     No cipro or others  . Iohexol      Code: HIVES, Desc: HIVES S/P IVP MANY YRS AGO...NO PROBLEM W/O PREP TODAY/A.CALHOUN, Onset Date: [id number]   . Levaquin [Levofloxacin]   . Quinolones   . Shellfish Allergy Itching    Itching under the chin. Described by patient but not seen by husband.    Social History   Socioeconomic History  . Marital status: Married    Spouse name: Not on file  . Number of children: 1  . Years of education: Not on file  . Highest education level: Not on file  Occupational History  . Occupation: accounting  Tobacco Use  . Smoking status: Never Smoker  . Smokeless tobacco: Never Used  Vaping Use  . Vaping Use: Never used  Substance and Sexual Activity  . Alcohol use: Never  . Drug use: Never  . Sexual activity: Not Currently    Partners: Male    Comment: husband vasectomy  Other Topics Concern  . 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  Negative for chills and fever.  HENT: Negative.  Negative for congestion and sore throat.   Respiratory: Negative.  Negative for cough and shortness of breath.   Cardiovascular: Negative.  Negative for chest pain and palpitations.  Gastrointestinal: Negative.  Negative for abdominal pain, diarrhea, nausea and vomiting.  Genitourinary: Negative.  Negative for dysuria.  Skin: Negative.  Negative for rash.   Neurological: Negative.  Negative for dizziness and headaches.  All other systems reviewed and are negative.   Objective  Alert and oriented x3 in no apparent respiratory distress. Vitals as reported by the patient: Today's Vitals   01/18/20 0856  Weight: 116 lb (52.6 kg)  Height: 5\' 4"  (1.626 m)    There are no diagnoses linked to this encounter.  Tally was seen today for medical management of chronic issues.  Diagnoses and all orders for this visit:  Vascular dementia without behavioral disturbance ([place])  Essential hypertension    Clinically stable.  No medical concerns identified during this visit. Continue present medications.  No changes. Office visit in 6 months.  I discussed the assessment and treatment plan with the patient. The patient was provided an opportunity to  ask questions and all were answered. The patient agreed with the plan and demonstrated an understanding of the instructions.   The patient was advised to call back or seek an in-person evaluation if the symptoms worsen or if the condition fails to improve as anticipated.  I provided 20 minutes of non-face-to-face time during this encounter.  Horald Pollen, MD  Primary Care at [facility]

## 2020-01-18 NOTE — Patient Instructions (Signed)
° ° ° °  If you have lab work done today you will be contacted with your lab results within the next 2 weeks.  If you have not heard from us then please contact us. The fastest way to get your results is to register for My Chart. ° ° °IF you received an x-ray today, you will receive an invoice from [facility]. Please contact [facility] at [phone] with questions or concerns regarding your invoice.  ° °IF you received labwork today, you will receive an invoice from [facility]. Please contact [facility] at [phone] with questions or concerns regarding your invoice.  ° °Our billing staff will not be able to assist you with questions regarding bills from these companies. ° °You will be contacted with the lab results as soon as they are available. The fastest way to get your results is to activate your My Chart account. Instructions are located on the last page of this paperwork. If you have not heard from us regarding the results in 2 weeks, please contact this office. °  ° ° ° °

## 2020-01-25 ENCOUNTER — Ambulatory Visit (INDEPENDENT_AMBULATORY_CARE_PROVIDER_SITE_OTHER): Payer: BC Managed Care – PPO

## 2020-01-25 DIAGNOSIS — I6349 Cerebral infarction due to embolism of other cerebral artery: Secondary | ICD-10-CM

## 2020-01-29 LAB — CUP PACEART REMOTE DEVICE CHECK
Date Time Interrogation Session: 20220127190623
Implantable Pulse Generator Implant Date: 20211020

## 2020-02-04 NOTE — Progress Notes (Signed)
Carelink Summary Report / Loop Recorder 

## 2020-02-05 NOTE — Addendum Note (Signed)
Addended by: Douglass Rivers D on: 02/05/2020 12:08 PM   Modules accepted: Level of Service

## 2020-02-09 ENCOUNTER — Encounter: Payer: Self-pay | Admitting: Emergency Medicine

## 2020-02-09 ENCOUNTER — Telehealth: Payer: Self-pay | Admitting: Emergency Medicine

## 2020-02-09 NOTE — Telephone Encounter (Signed)
FYI:Pt's husband called in wanting [place] to know his wife has lost the ability to read music.

## 2020-02-10 NOTE — Telephone Encounter (Signed)
Thanks for the info.

## 2020-02-10 NOTE — Telephone Encounter (Signed)
FYI to provider

## 2020-02-11 NOTE — Telephone Encounter (Signed)
FYI from pt about BP readings

## 2020-02-24 ENCOUNTER — Encounter: Payer: Self-pay | Admitting: Emergency Medicine

## 2020-02-29 ENCOUNTER — Encounter: Payer: Self-pay | Admitting: Emergency Medicine

## 2020-02-29 ENCOUNTER — Other Ambulatory Visit: Payer: Self-pay | Admitting: Neurology

## 2020-02-29 DIAGNOSIS — F0151 Vascular dementia with behavioral disturbance: Secondary | ICD-10-CM

## 2020-02-29 DIAGNOSIS — I6349 Cerebral infarction due to embolism of other cerebral artery: Secondary | ICD-10-CM

## 2020-02-29 DIAGNOSIS — F01518 Vascular dementia, unspecified severity, with other behavioral disturbance: Secondary | ICD-10-CM

## 2020-03-01 ENCOUNTER — Other Ambulatory Visit: Payer: Self-pay

## 2020-03-01 ENCOUNTER — Encounter: Payer: Self-pay | Admitting: Speech Pathology

## 2020-03-01 ENCOUNTER — Ambulatory Visit: Payer: BC Managed Care – PPO | Attending: Neurology | Admitting: Speech Pathology

## 2020-03-01 DIAGNOSIS — R41841 Cognitive communication deficit: Secondary | ICD-10-CM | POA: Insufficient documentation

## 2020-03-01 NOTE — Therapy (Signed)
[facility]. [place], [name], [address] Phone: [phone]   Fax:  [phone]  Speech Language Pathology Evaluation  Patient Details  Name: Jacqueline Bonilla MRN: [id number] Date of Birth: 27-Oct-1955 Referring Provider (SLP): [name] MD   Encounter Date: 03/01/2020   End of Session - 03/01/20 1634    Visit Number 1    Number of Visits 17    Date for SLP Re-Evaluation 05/01/20    SLP Start Time 1445    SLP Stop Time  1536    SLP Time Calculation (min) 51 min    Activity Tolerance Patient tolerated treatment well           Past Medical History:  Diagnosis Date  . Anemia   . Anxiety   . Depression   . Dysmenorrhea   . Endometriosis   . Fibroid   . Hypertension   . Microhematuria    negative workup  . Osteoporosis   . Tachycardia   . Thrombocytopenia ([place])     Past Surgical History:  Procedure Laterality Date  . BREAST BIOPSY    . CESAREAN SECTION    . hysteroscopic resection    . implantable loop recorder implant  10/21/2019   Medtronic Reveal Linq model LNQ 22 ([place] [id number]) implantable loop recorder    There were no vitals filed for this visit.   Subjective Assessment - 03/01/20 1634    Subjective "I am doing well."    Patient is accompained by: Family member   Spouse - [name]   Currently in Pain? No/denies              SLP Evaluation [facility] - 03/01/20 0001      SLP Visit Information   SLP Received On 03/01/20    Referring Provider (SLP) [name] MD    Medical Diagnosis CVA; Dementia w/ Behavioral Disturbance      Subjective   Patient/Family Stated Goal I would like to be able to drive; work on knowing my left and right.      General Information   HPI Jacqueline Bonilla is a 64 y.o. female here as requested by Horald Pollen, * for "Brain Fog"  PMHx thrombocytopenia, tachycardia, osteoporosis, hypertension, dementia in her mother, depression, anxiety,  anemia.Dx with CVA and vascular dementia.      Balance Screen   Has the patient fallen in the past 6 months Yes    How many times? 1    Has the patient had a decrease in activity level because of a fear of falling?  No    Is the patient reluctant to leave their home because of a fear of falling?  No      Prior Functional Status   Cognitive/Linguistic Baseline Within functional limits    Type of Home House     Lives With Spouse    Available Support Family    Education Bachelors    Vocation Unemployed      Cognition   Overall Cognitive Status Impaired/Different from baseline    Area of Impairment Memory;Safety/judgement;Awareness    Executive Function Reasoning;Organizing;Self Monitoring;Sequencing      Auditory Comprehension   Overall Auditory Comprehension Impaired    Interfering Components Processing speed;Working Forensic scientist Expression   Overall Verbal Expression Appears within functional limits for tasks assessed      Standardized Assessments   Standardized Assessments  Other Assessment    Other Assessment SLUMS;  WAB Bedside                           SLP Education - 03/01/20 1713    Education Details Cognitive-linguistic impairment    Person(s) Educated Patient;Spouse    Methods Explanation;Demonstration    Comprehension Verbalized understanding            SLP Short Term Goals - 03/01/20 1657      SLP SHORT TERM GOAL #1   Title Pt will provide 3 areas of challenge for her given min verbal cues.    Time 4    Period Weeks    Status New      SLP SHORT TERM GOAL #2   Title Pt will provide 3 strategies to compensate for difficulty recalling information.    Time 4    Period Weeks            SLP Long Term Goals - 03/01/20 1646      SLP LONG TERM GOAL #1   Title Pt will demonstrate self- awareness of deficits to increase self-regulation.    Time 8    Period Weeks    Status New      SLP LONG TERM GOAL #2   Title Patient will  demonstrate use of memory strategies to schedule activities, recall weekly events and items to maintain safety to participate socially in functional living environment    Time 8    Period Weeks    Status New            Plan - 03/01/20 1635    Clinical Impression Statement Pt is a 65 yo female with a diagnosis of CVA (last year) and vascular dementia w/ behavior disturbance. Pt is accompanied by her husband, [name]. [name] called earlier today to provide with information regarding Jacqueline Bonilla's case prior to appointment with concerns for patient's agitation/emotional lability. Pt contradicted self during evaluation on "not remembering anything - blank slate" to having "no trouble with any thinking skills (memory included)." SLUMS and WAB-Bedside was completed with patient. Pt scored a 17/30 on the SLUMS and scored WFL on the WAB-R. SLUMS revealed deficits in short term memory, problem solving, and executive functioning skills. Pt husband reports emotional lability at home - easily angered and frustrated with him; however, is relatively pleasant with everyone else. SLP rec skilled speech services to provide edu on relieving caregiver burden and to increase pt's independence with ADLs.    Speech Therapy Frequency 2x / week    Duration 8 weeks    Treatment/Interventions Compensatory strategies;Cueing hierarchy;Functional tasks;Patient/family education;Environmental controls;Cognitive reorganization;Multimodal communcation approach;Language facilitation;Compensatory techniques;Internal/external aids;SLP instruction and feedback    Potential to Achieve Goals Fair    Potential Considerations Ability to learn/carryover information    Consulted and Agree with Plan of Care Patient;Family member/caregiver    Family Member [name]           Patient will benefit from skilled therapeutic intervention in order to improve the following deficits and impairments:   Cognitive communication  deficit    Problem List Patient Active Problem List   Diagnosis Date Noted  . Vascular dementia with behavior disturbance ([place]) 10/13/2019  . Vascular dementia without behavioral disturbance ([place]) 07/15/2019  . Cerebrovascular accident ([place]) 06/25/2019  . Brain fog 04/22/2019  . History of COVID-19 04/22/2019  . Essential hypertension 03/03/2019  . Hypertensive urgency 03/03/2019    [name] MS, [place], CBIS  03/01/2020, 5:14 PM  [facility]  [address]. [place], [name], [address] Phone: [phone]   Fax:  [phone]  Name: JALAN BODI MRN: [id number] Date of Birth: August 15, 1955

## 2020-03-02 ENCOUNTER — Ambulatory Visit (INDEPENDENT_AMBULATORY_CARE_PROVIDER_SITE_OTHER): Payer: BC Managed Care – PPO | Admitting: Emergency Medicine

## 2020-03-02 ENCOUNTER — Encounter: Payer: Self-pay | Admitting: Emergency Medicine

## 2020-03-02 ENCOUNTER — Ambulatory Visit (INDEPENDENT_AMBULATORY_CARE_PROVIDER_SITE_OTHER): Payer: BC Managed Care – PPO

## 2020-03-02 ENCOUNTER — Other Ambulatory Visit: Payer: Self-pay

## 2020-03-02 VITALS — BP 130/80 | HR 77 | Temp 98.3°F | Resp 16 | Ht 64.0 in | Wt 118.0 lb

## 2020-03-02 DIAGNOSIS — I10 Essential (primary) hypertension: Secondary | ICD-10-CM | POA: Diagnosis not present

## 2020-03-02 DIAGNOSIS — F0150 Vascular dementia without behavioral disturbance: Secondary | ICD-10-CM

## 2020-03-02 DIAGNOSIS — I639 Cerebral infarction, unspecified: Secondary | ICD-10-CM | POA: Diagnosis not present

## 2020-03-02 NOTE — Patient Instructions (Signed)
Health Maintenance After Age 67 After age 67, you are at a higher risk for certain long-term diseases and infections as well as injuries from falls. Falls are a major cause of broken bones and head injuries in people who are older than age 67. Getting regular preventive care can help to keep you healthy and well. Preventive care includes getting regular testing and making lifestyle changes as recommended by your health care provider. Talk with your health care provider about:  Which screenings and tests you should have. A screening is a test that checks for a disease when you have no symptoms.  A diet and exercise plan that is right for you. What should I know about screenings and tests to prevent falls? Screening and testing are the best ways to find a health problem early. Early diagnosis and treatment give you the best chance of managing medical conditions that are common after age 67. Certain conditions and lifestyle choices may make you more likely to have a fall. Your health care provider may recommend:  Regular vision checks. Poor vision and conditions such as cataracts can make you more likely to have a fall. If you wear glasses, make sure to get your prescription updated if your vision changes.  Medicine review. Work with your health care provider to regularly review all of the medicines you are taking, including over-the-counter medicines. Ask your health care provider about any side effects that may make you more likely to have a fall. Tell your health care provider if any medicines that you take make you feel dizzy or sleepy.  Osteoporosis screening. Osteoporosis is a condition that causes the bones to get weaker. This can make the bones weak and cause them to break more easily.  Blood pressure screening. Blood pressure changes and medicines to control blood pressure can make you feel dizzy.  Strength and balance checks. Your health care provider may recommend certain tests to check your  strength and balance while standing, walking, or changing positions.  Foot health exam. Foot pain and numbness, as well as not wearing proper footwear, can make you more likely to have a fall.  Depression screening. You may be more likely to have a fall if you have a fear of falling, feel emotionally low, or feel unable to do activities that you used to do.  Alcohol use screening. Using too much alcohol can affect your balance and may make you more likely to have a fall. What actions can I take to lower my risk of falls? General instructions  Talk with your health care provider about your risks for falling. Tell your health care provider if: ? You fall. Be sure to tell your health care provider about all falls, even ones that seem minor. ? You feel dizzy, sleepy, or off-balance.  Take over-the-counter and prescription medicines only as told by your health care provider. These include any supplements.  Eat a healthy diet and maintain a healthy weight. A healthy diet includes low-fat dairy products, low-fat (lean) meats, and fiber from whole grains, beans, and lots of fruits and vegetables. Home safety  Remove any tripping hazards, such as rugs, cords, and clutter.  Install safety equipment such as grab bars in bathrooms and safety rails on stairs.  Keep rooms and walkways well-lit. Activity  Follow a regular exercise program to stay fit. This will help you maintain your balance. Ask your health care provider what types of exercise are appropriate for you.  If you need a cane or walker,   use it as recommended by your health care provider.  Wear supportive shoes that have nonskid soles.   Lifestyle  Do not drink alcohol if your health care provider tells you not to drink.  If you drink alcohol, limit how much you have: ? 0-1 drink a day for women. ? 0-2 drinks a day for men.  Be aware of how much alcohol is in your drink. In the [place] one drink equals one typical bottle of beer (12  oz), one-half glass of wine (5 oz), or one shot of hard liquor (1 oz).  Do not use any products that contain nicotine or tobacco, such as cigarettes and e-cigarettes. If you need help quitting, ask your health care provider. Summary  Having a healthy lifestyle and getting preventive care can help to protect your health and wellness after age 67.  Screening and testing are the best way to find a health problem early and help you avoid having a fall. Early diagnosis and treatment give you the best chance for managing medical conditions that are more common for people who are older than age 67.  Falls are a major cause of broken bones and head injuries in people who are older than age 67. Take precautions to prevent a fall at home.  Work with your health care provider to learn what changes you can make to improve your health and wellness and to prevent falls. This information is not intended to replace advice given to you by your health care provider. Make sure you discuss any questions you have with your health care provider. Document Revised: 04/10/2018 Document Reviewed: 10/31/2016 Elsevier Patient Education  2021 Elsevier Inc.  

## 2020-03-02 NOTE — Progress Notes (Signed)
[name] 65 y.o.   No chief complaint on file.   HISTORY OF PRESENT ILLNESS: This is a 65 y.o. female with history of hypertension and vascular dementia here for follow-up.  Accompanied by husband Granville Lewis. Patient has been doing well.  Exercising regularly and eating better. Has no complaints or medical concerns today.  HPI   Prior to Admission medications   Medication Sig Start Date End Date Taking? Authorizing Provider  aspirin EC 81 MG tablet Take 324 mg by mouth daily. Swallow whole.    [provider]  Cholecalciferol (VITAMIN D3 GUMMIES PO) Take 1,000 mcg by mouth daily.    [provider]  losartan (COZAAR) 25 MG tablet Take 1 tablet (25 mg total) by mouth daily. 07/20/19 10/18/19  Horald Pollen, MD  MAGNESIUM PO Take by mouth as needed.     [provider]  MELATONIN PO Take 10 mg by mouth daily.    [provider]  Multiple Vitamins-Minerals (AIRBORNE GUMMIES) CHEW Chew by mouth daily.    [provider]  Multiple Vitamins-Minerals (HAIR SKIN AND NAILS FORMULA PO) Take 2,500 mcg by mouth daily.    [provider]  OVER THE COUNTER MEDICATION daily.    [provider]  Probiotic Product (PROBIOTIC DAILY PO) Take by mouth daily.    [provider]  UNABLE TO FIND Med Name: "striction D" (?) for blood pressure     [provider]    Allergies  Allergen Reactions  . Avelox [Moxifloxacin]   . Ciprofloxacin Hives  . Floraquin [Iodoquinol]     No cipro or others  . Iohexol      Code: HIVES, Desc: HIVES S/P IVP MANY YRS AGO...NO PROBLEM W/O PREP TODAY/A.[place], Onset Date: [id number]   . Levaquin [Levofloxacin]   . Quinolones   . Shellfish Allergy Itching    Itching under the chin. Described by patient but not seen by husband.    Patient Active Problem List   Diagnosis Date Noted  . Vascular dementia with behavior disturbance ([place]) 10/13/2019  . Vascular dementia  without behavioral disturbance ([place]) 07/15/2019  . Cerebrovascular accident ([place]) 06/25/2019  . Brain fog 04/22/2019  . History of COVID-19 04/22/2019  . Essential hypertension 03/03/2019  . Hypertensive urgency 03/03/2019    Past Medical History:  Diagnosis Date  . Anemia   . Anxiety   . Depression   . Dysmenorrhea   . Endometriosis   . Fibroid   . Hypertension   . Microhematuria    negative workup  . Osteoporosis   . Tachycardia   . Thrombocytopenia ([place])     Past Surgical History:  Procedure Laterality Date  . BREAST BIOPSY    . CESAREAN SECTION    . hysteroscopic resection    . implantable loop recorder implant  10/21/2019   Medtronic Reveal Linq model LNQ 22 ([place] [id number]) implantable loop recorder    Social History   Socioeconomic History  . Marital status: Married    Spouse name: Not on file  . Number of children: 1  . Years of education: Not on file  . Highest education level: Not on file  Occupational History  . Occupation: accounting  Tobacco Use  . Smoking status: Never Smoker  . Smokeless tobacco: Never Used  Vaping Use  . Vaping Use: Never used  Substance and Sexual Activity  . Alcohol use: Never  . Drug use: Never  . Sexual activity: Not Currently    Partners: Male  Comment: husband vasectomy  Other Topics Concern  . 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 Cancer Father        non hodgkin lymphoma & skin  . Heart attack Maternal Grandfather    . Dementia Mother   . Polymyositis Sister      Review of Systems  Constitutional: Negative.  Negative for chills and fever.  HENT: Negative.  Negative for congestion and sore throat.   Respiratory: Negative.  Negative for cough and shortness of breath.   Cardiovascular: Negative.  Negative for chest pain and palpitations.  Gastrointestinal: Negative for abdominal pain, blood in stool, melena, nausea and vomiting.  Genitourinary: Negative.  Negative for dysuria and hematuria.  Musculoskeletal: Negative.  Negative for joint pain and myalgias.  Skin: Negative.  Negative for rash.  Neurological: Negative.  Negative for dizziness and headaches.  All other systems reviewed and are negative.    Today's Vitals   03/02/20 1706  BP: 130/80  Pulse: 77  Resp: 16  Temp: 98.3 F (36.8 C)  SpO2: 98%  Weight: 118 lb (53.5 kg)  Height: 5\' 4"  (1.626 m)   Body mass index is 20.25 kg/m.   Physical Exam Vitals reviewed.  Constitutional:      Appearance: Normal appearance.  HENT:     Head: Normocephalic.  Eyes:     Extraocular Movements: Extraocular movements intact.     Conjunctiva/sclera: Conjunctivae normal.     Pupils: Pupils are equal, round, and reactive to light.  Cardiovascular:     Rate and Rhythm: Normal rate and regular rhythm.     Pulses: Normal pulses.     Heart sounds: Normal heart sounds.  Pulmonary:     Effort: Pulmonary effort is normal.     Breath sounds: Normal breath sounds.  Abdominal:     General: Bowel sounds are normal.     Palpations: Abdomen is soft.  Musculoskeletal:        General: Normal range of motion.     Cervical back: Normal range of motion and neck supple.     Right lower leg: No edema.     Left lower leg: No edema.  Skin:    General: Skin is warm and dry.     Capillary Refill: Capillary refill takes less than 2 seconds.  Neurological:     General: No focal deficit present.     Mental Status: She is alert and oriented to person, place, and  time.  Psychiatric:        Mood and Affect: Mood normal.        Behavior: Behavior normal.      ASSESSMENT & PLAN: Clinically stable.  No medical concerns identified during this visit. Well-controlled hypertension. Continue present medications.  No changes. Follow-up with neurologist and follow-up here in 6 months. Diagnoses and all orders for this visit:  Vascular dementia without behavioral disturbance [facility])  Essential hypertension    Patient Instructions   Health Maintenance After Age 67 After age 52, you are at a higher risk for certain long-term diseases and infections as well as injuries from falls. Falls are a major cause of broken bones and head injuries in people who are older than age 46. Getting regular preventive care can help to keep you healthy and well. Preventive care includes getting regular testing and making lifestyle changes as recommended by your health care provider. Talk with your health care provider about:  Which screenings and tests you should have. A screening is a test that checks for a disease when you have no symptoms.  A diet and exercise plan that is right for you. What should I know about screenings and tests to prevent falls? Screening and testing are the best ways to find a health problem early. Early diagnosis and treatment give you the best chance of managing medical conditions that are common after age 29. Certain conditions and lifestyle choices may make you more likely to have a fall. Your health care provider may recommend:  Regular vision checks. Poor vision and conditions such as cataracts can make you more likely to have a fall. If you wear glasses, make sure to get your prescription updated if your vision changes.  Medicine review. Work with your health care provider to regularly review all of the medicines you are taking, including over-the-counter medicines. Ask your health care provider about any side effects that may make you more  likely to have a fall. Tell your health care provider if any medicines that you take make you feel dizzy or sleepy.  Osteoporosis screening. Osteoporosis is a condition that causes the bones to get weaker. This can make the bones weak and cause them to break more easily.  Blood pressure screening. Blood pressure changes and medicines to control blood pressure can make you feel dizzy.  Strength and balance checks. Your health care provider may recommend certain tests to check your strength and balance while standing, walking, or changing positions.  Foot health exam. Foot pain and numbness, as well as not wearing proper footwear, can make you more likely to have a fall.  Depression screening. You may be more likely to have a fall if you have a fear of falling, feel emotionally low, or feel unable to do activities that you used to do.  Alcohol use screening. Using too much alcohol can affect your balance and may make you more likely to have a fall. What actions can I take to lower my risk of falls? General instructions  Talk with your health care provider about your risks for falling. Tell your health care provider if: ? You fall. Be sure to tell your health care provider about all falls, even ones that seem minor. ? You feel dizzy, sleepy, or off-balance.  Take over-the-counter and prescription medicines only as told by your health care provider. These include any supplements.  Eat a healthy diet and maintain a healthy weight. A healthy diet includes low-fat dairy products, low-fat (lean) meats, and fiber from whole grains, beans, and lots of fruits and vegetables. Home safety  Remove any tripping hazards, such as rugs, cords, and clutter.  Install safety equipment such as grab bars in bathrooms and safety rails on stairs.  Keep rooms and walkways well-lit. Activity  Follow a regular exercise program to stay fit. This will help you maintain your balance. Ask your health care provider  what types of exercise are appropriate for you.  If you need a cane or walker, use it as recommended by your health care provider.  Wear supportive shoes that have nonskid soles.   Lifestyle  Do not drink alcohol if your health care provider tells you not to drink.  If you drink alcohol, limit how much you have: ? 0-1 drink a day for women. ? 0-2 drinks a day for men.  Be aware of how much alcohol is in your drink. In the [place] one drink equals one typical bottle of beer (12 oz), one-half glass of wine (5 oz), or one shot of hard liquor (1 oz).  Do not use any products that contain nicotine or tobacco, such as cigarettes and e-cigarettes. If you need help quitting, ask your health care provider. Summary  Having a healthy lifestyle and getting preventive care can help to protect your health and wellness after age 48.  Screening and testing are the best way to find a health problem early and help you avoid having a fall. Early diagnosis and treatment give you the best chance for managing medical conditions that are more common for people who are older than age 66.  Falls are a major cause of broken bones and head injuries in people who are older than age 68. Take precautions to prevent a fall at home.  Work with your health care provider to learn what changes you can make to improve your health and wellness and to prevent falls. This information is not intended to replace advice given to you by your health care provider. Make sure you discuss any questions you have with your health care provider. Document Revised: 04/10/2018 Document Reviewed: 10/31/2016 Elsevier Patient Education  2021 Elsevier Inc.      Agustina Caroli, MD [facility]

## 2020-03-03 LAB — CUP PACEART REMOTE DEVICE CHECK
Date Time Interrogation Session: 20220301190756
Implantable Pulse Generator Implant Date: 20211020

## 2020-03-04 ENCOUNTER — Ambulatory Visit: Payer: BC Managed Care – PPO | Admitting: Speech Pathology

## 2020-03-04 ENCOUNTER — Other Ambulatory Visit: Payer: Self-pay

## 2020-03-04 ENCOUNTER — Encounter: Payer: Self-pay | Admitting: Speech Pathology

## 2020-03-04 DIAGNOSIS — R41841 Cognitive communication deficit: Secondary | ICD-10-CM

## 2020-03-04 NOTE — Therapy (Signed)
[place]. [place], [name], [address] Phone: [phone]   Fax:  [phone]  Speech Language Pathology Treatment  Patient Details  Name: Jacqueline Bonilla MRN: [id number] Date of Birth: 1955-07-18 Referring Provider (SLP): [name] MD   Encounter Date: 03/04/2020   End of Session - 03/04/20 0809    Visit Number 2    Number of Visits 17    Date for SLP Re-Evaluation 05/01/20    SLP Start Time 0803    SLP Stop Time  0845    SLP Time Calculation (min) 42 min    Activity Tolerance Patient tolerated treatment well           Past Medical History:  Diagnosis Date  . Anemia   . Anxiety   . Depression   . Dysmenorrhea   . Endometriosis   . Fibroid   . Hypertension   . Microhematuria    negative workup  . Osteoporosis   . Tachycardia   . Thrombocytopenia ([place])     Past Surgical History:  Procedure Laterality Date  . BREAST BIOPSY    . CESAREAN SECTION    . hysteroscopic resection    . implantable loop recorder implant  10/21/2019   Medtronic Reveal Linq model LNQ 22 ([place] [id number]) implantable loop recorder    There were no vitals filed for this visit.   Subjective Assessment - 03/04/20 0806    Subjective "I am doing well."    Patient is accompained by: Family member    Currently in Pain? No/denies                 ADULT SLP TREATMENT - 03/04/20 0944      General Information   Behavior/Cognition Alert;Cooperative;Pleasant mood      Treatment Provided   Treatment provided Cognitive-Linquistic      Cognitive-Linquistic Treatment   Treatment focused on Cognition    Skilled Treatment Completed QOL-AD questionairre for baseline information regarding patient's current situation. Pt believes her inability to drive causes a lot of her frustration. Spoke about the importance of speaking with her husband and how best to navigate the new impairments in memory and delayed processing. Pt  reports she has been unable to participate in card games and would like to be able to participate more fully in the game. To discuss rules and support next session.      Progression Toward Goals   Progression toward goals Progressing toward goals            SLP Education - 03/04/20 0807    Education Details Vascular dementia    Person(s) Educated Patient    Methods Explanation    Comprehension Verbalized understanding            SLP Short Term Goals - 03/04/20 0943      SLP SHORT TERM GOAL #1   Title Pt will provide 3 areas of challenge for her given min verbal cues.    Time 4    Period Weeks    Status New      SLP SHORT TERM GOAL #2   Title Pt will provide 3 strategies to compensate for difficulty recalling information.    Time 4    Period Weeks            SLP Long Term Goals - 03/04/20 0943      SLP LONG TERM GOAL #1   Title Pt will demonstrate self- awareness of deficits to increase  self-regulation.    Time 8    Period Weeks    Status New      SLP LONG TERM GOAL #2   Title Patient will demonstrate use of memory strategies to schedule activities, recall weekly events and items to maintain safety to participate socially in functional living environment    Time [place] - 03/04/20 0943    Clinical Impression Statement Pt is a 65 yo female with a diagnosis of CVA (last year) and vascular dementia w/ behavior disturbance. Pt is accompanied by her husband, [name]. [name] called earlier today to provide with information regarding [name]'s case prior to appointment with concerns for patient's agitation/emotional lability. Pt contradicted self during evaluation on "not remembering anything - blank slate" to having "no trouble with any thinking skills (memory included)." SLUMS and WAB-Bedside was completed with patient. Pt scored a 17/30 on the SLUMS and scored WFL on the WAB-R. SLUMS revealed deficits in short term memory, problem  solving, and executive functioning skills. Pt husband reports emotional lability at home - easily angered and frustrated with him; however, is relatively pleasant with everyone else. SLP rec skilled speech services to provide edu on relieving caregiver burden and to increase pt's independence with ADLs.    Speech Therapy Frequency 2x / week    Duration 8 weeks    Treatment/Interventions Compensatory strategies;Cueing hierarchy;Functional tasks;Patient/family education;Environmental controls;Cognitive reorganization;Multimodal communcation approach;Language facilitation;Compensatory techniques;Internal/external aids;SLP instruction and feedback    Potential to Achieve Goals Fair    Potential Considerations Ability to learn/carryover information    Consulted and Agree with Plan of Care Patient;Family member/caregiver    Family Member [name]           Patient will benefit from skilled therapeutic intervention in order to improve the following deficits and impairments:   Cognitive communication deficit    Problem List Patient Active Problem List   Diagnosis Date Noted  . Vascular dementia with behavior disturbance ([place]) 10/13/2019  . Vascular dementia without behavioral disturbance ([place]) 07/15/2019  . Cerebrovascular accident ([place]) 06/25/2019  . Brain fog 04/22/2019  . History of COVID-19 04/22/2019  . Essential hypertension 03/03/2019  . Hypertensive urgency 03/03/2019    [name] MS, [place], CBIS  03/04/2020, 9:51 AM  [place]. [place], [name], [address] Phone: [phone]   Fax:  [phone]   Name: Jacqueline Bonilla MRN: [id number] Date of Birth: 02-10-1955

## 2020-03-08 ENCOUNTER — Ambulatory Visit: Payer: BC Managed Care – PPO | Admitting: Speech Pathology

## 2020-03-08 ENCOUNTER — Encounter: Payer: Self-pay | Admitting: Speech Pathology

## 2020-03-08 ENCOUNTER — Other Ambulatory Visit: Payer: Self-pay

## 2020-03-08 DIAGNOSIS — R41841 Cognitive communication deficit: Secondary | ICD-10-CM | POA: Diagnosis not present

## 2020-03-08 NOTE — Therapy (Signed)
[place]. [place], [name], [address] Phone: [phone]   Fax:  [phone]  Speech Language Pathology Treatment  Patient Details  Name: Jacqueline Bonilla MRN: [id number] Date of Birth: [birth year] Referring Provider (SLP): [name] MD   Encounter Date: 03/08/2020   End of Session - 03/08/20 0934    Visit Number 3    Number of Visits 17    Date for SLP Re-Evaluation 05/01/20    SLP Start Time 0845    SLP Stop Time  0933    SLP Time Calculation (min) 48 min    Activity Tolerance Patient tolerated treatment well           Past Medical History:  Diagnosis Date  . Anemia   . Anxiety   . Depression   . Dysmenorrhea   . Endometriosis   . Fibroid   . Hypertension   . Microhematuria    negative workup  . Osteoporosis   . Tachycardia   . Thrombocytopenia ([place])     Past Surgical History:  Procedure Laterality Date  . BREAST BIOPSY    . CESAREAN SECTION    . hysteroscopic resection    . implantable loop recorder implant  10/21/2019   Medtronic Reveal Linq model LNQ 22 ([place] [id number]) implantable loop recorder    There were no vitals filed for this visit.          ADULT SLP TREATMENT - 03/08/20 0001      General Information   Behavior/Cognition Alert;Cooperative;Pleasant mood      Treatment Provided   Treatment provided Cognitive-Linquistic      Cognitive-Linquistic Treatment   Treatment focused on Cognition    Skilled Treatment Pt spouse joined session today to teach pt and SLP how to play pt's favorite card game, "Rook". SLP spoke with pt and spouse about supports during card game to increase pt comfort level. Spoke with pt and husband about changes with brain injury in agitation (based off husband's report) and other behaviors.      Assessment / Recommendations / Plan   Plan Continue with current plan of care      Progression Toward Goals   Progression toward goals  Progressing toward goals            SLP Education - 03/08/20 0942    Education Details Provided edu on behavior changes.    Person(s) Educated Patient;Spouse    Methods Explanation    Comprehension Verbalized understanding;Need further instruction            SLP Short Term Goals - 03/08/20 0937      SLP SHORT TERM GOAL #1   Title Pt will provide 3 areas of challenge for her given min verbal cues.    Time 3    Period Weeks    Status New      SLP SHORT TERM GOAL #2   Title Pt will provide 3 strategies to compensate for difficulty recalling information.    Time 3    Period Weeks            SLP Long Term Goals - 03/08/20 1027      SLP LONG TERM GOAL #1   Title Pt will demonstrate self- awareness of deficits to increase self-regulation.    Time 7    Period Weeks    Status New      SLP LONG TERM GOAL #2   Title Patient will demonstrate use of memory strategies  to schedule activities, recall weekly events and items to maintain safety to participate socially in functional living environment    Time 7    Period Weeks    Status New            Plan - 03/08/20 0935    Clinical Impression Statement Pt is a 65 yo female with a diagnosis of CVA (last year) and vascular dementia w/ behavior disturbance. Pt is accompanied by her husband, Jacqueline Bonilla. Jacqueline Bonilla called earlier today to provide with information regarding Jacqueline Bonilla's case prior to appointment with concerns for patient's agitation/emotional lability. Pt contradicted self during evaluation on "not remembering anything - blank slate" to having "no trouble with any thinking skills (memory included)." SLUMS and WAB-Bedside was completed with patient. Pt scored a 17/30 on the SLUMS and scored WFL on the WAB-R. SLUMS revealed deficits in short term memory, problem solving, and executive functioning skills. Pt husband reports emotional lability at home - easily angered and frustrated with him; however, is relatively pleasant with everyone  else. SLP rec skilled speech services to provide edu on relieving caregiver burden and to increase pt's independence with ADLs.Reiterated that vascular dementia is neurodegenerative and they are doing all the right things to navigate changes. Pt verbalized, "We are doing all the right things, I won't experience a decline."    Speech Therapy Frequency 2x / week    Duration 8 weeks    Treatment/Interventions Compensatory strategies;Cueing hierarchy;Functional tasks;Patient/family education;Environmental controls;Cognitive reorganization;Multimodal communcation approach;Language facilitation;Compensatory techniques;Internal/external aids;SLP instruction and feedback    Potential to Achieve Goals Fair    Potential Considerations Ability to learn/carryover information    Consulted and Agree with Plan of Care Patient;Family member/caregiver    Family Member [name]           Patient will benefit from skilled therapeutic intervention in order to improve the following deficits and impairments:   Cognitive communication deficit    Problem List Patient Active Problem List   Diagnosis Date Noted  . Vascular dementia with behavior disturbance ([place]) 10/13/2019  . Vascular dementia without behavioral disturbance ([place]) 07/15/2019  . Cerebrovascular accident ([place]) 06/25/2019  . Brain fog 04/22/2019  . History of COVID-19 04/22/2019  . Essential hypertension 03/03/2019  . Hypertensive urgency 03/03/2019    Jacqueline Bonilla, [place], CBIS  03/08/2020, 9:42 AM  [place]. [place], [name], [address] Phone: [phone]   Fax:  [phone]   Name: Jacqueline Bonilla MRN: [id number] Date of Birth: 1955/05/31

## 2020-03-08 NOTE — Patient Instructions (Signed)
[name] and [place]:  Come up with two strategies(each) to assist in improving interpersonal communication.

## 2020-03-10 ENCOUNTER — Encounter: Payer: Self-pay | Admitting: Speech Pathology

## 2020-03-10 ENCOUNTER — Ambulatory Visit: Payer: BC Managed Care – PPO | Admitting: Speech Pathology

## 2020-03-10 ENCOUNTER — Other Ambulatory Visit: Payer: Self-pay

## 2020-03-10 DIAGNOSIS — R41841 Cognitive communication deficit: Secondary | ICD-10-CM | POA: Diagnosis not present

## 2020-03-10 NOTE — Patient Instructions (Signed)
Come up with some ideas/skills/hobbies you would like to learn.

## 2020-03-10 NOTE — Progress Notes (Signed)
Carelink Summary Report / Loop Recorder 

## 2020-03-10 NOTE — Therapy (Signed)
[place]. [place], [name], [address] Phone: [phone]   Fax:  [phone]  Speech Language Pathology Treatment  Patient Details  Name: Jacqueline Bonilla MRN: [id number] Date of Birth: Apr 11, 1955 Referring Provider (SLP): [name] MD   Encounter Date: 03/10/2020   End of Session - 03/10/20 0946    Visit Number 4    Number of Visits 17    Date for SLP Re-Evaluation 05/01/20    SLP Start Time 0850    SLP Stop Time  0940    SLP Time Calculation (min) 50 min    Activity Tolerance Patient tolerated treatment well           Past Medical History:  Diagnosis Date  . Anemia   . Anxiety   . Depression   . Dysmenorrhea   . Endometriosis   . Fibroid   . Hypertension   . Microhematuria    negative workup  . Osteoporosis   . Tachycardia   . Thrombocytopenia ([place])     Past Surgical History:  Procedure Laterality Date  . BREAST BIOPSY    . CESAREAN SECTION    . hysteroscopic resection    . implantable loop recorder implant  10/21/2019   Medtronic Reveal Linq model LNQ 22 ([place] [id number]) implantable loop recorder    There were no vitals filed for this visit.   Subjective Assessment - 03/10/20 0855    Subjective "The dog ate our homework."    Currently in Pain? No/denies                 ADULT SLP TREATMENT - 03/10/20 0001      General Information   Behavior/Cognition Alert;Cooperative;Pleasant mood      Treatment Provided   Treatment provided Cognitive-Linquistic      Cognitive-Linquistic Treatment   Treatment focused on Cognition    Skilled Treatment Provided edu on the neuroplasticity. Pt utilized organization strategy to highlight as she was going through each principle so she did not lose her place while she read. Provided examples of each principle and how they can integrate it into home life. Pt homework to come up with some ideas to learn a new skill (saliency).       Assessment / Recommendations / Plan   Plan Continue with current plan of care      Progression Toward Goals   Progression toward goals Progressing toward goals            SLP Education - 03/10/20 0945    Education Details Neuroplasticity principles    Person(s) Educated Patient    Methods Explanation;Demonstration;Handout    Comprehension Verbalized understanding            SLP Short Term Goals - 03/10/20 0946      SLP SHORT TERM GOAL #1   Title Pt will provide 3 areas of challenge for her given min verbal cues.    Time 3    Period Weeks    Status New      SLP SHORT TERM GOAL #2   Title Pt will provide 3 strategies to compensate for difficulty recalling information.    Time 3    Period Weeks            SLP Long Term Goals - 03/10/20 0947      SLP LONG TERM GOAL #1   Title Pt will demonstrate self- awareness of deficits to increase self-regulation.    Time 7  Period Weeks    Status New      SLP LONG TERM GOAL #2   Title Patient will demonstrate use of memory strategies to schedule activities, recall weekly events and items to maintain safety to participate socially in functional living environment    Time [place] - 03/10/20 0946    Clinical Impression Statement Pt is a 65 yo female with a diagnosis of CVA (last year) and vascular dementia w/ behavior disturbance. Pt is accompanied by her husband, Jacqueline Bonilla. Jacqueline Bonilla called earlier today to provide with information regarding Jacqueline Bonilla's case prior to appointment with concerns for patient's agitation/emotional lability. Pt contradicted self during evaluation on "not remembering anything - blank slate" to having "no trouble with any thinking skills (memory included)." SLUMS and WAB-Bedside was completed with patient. Pt scored a 17/30 on the SLUMS and scored WFL on the WAB-R. SLUMS revealed deficits in short term memory, problem solving, and executive functioning skills. Pt husband  reports emotional lability at home - easily angered and frustrated with him; however, is relatively pleasant with everyone else. SLP rec skilled speech services to provide edu on relieving caregiver burden and to increase pt's independence with ADLs.Reiterated that vascular dementia is neurodegenerative and they are doing all the right things to navigate changes. Pt verbalized, "We are doing all the right things, I won't experience a decline."    Speech Therapy Frequency 2x / week    Duration 8 weeks    Treatment/Interventions Compensatory strategies;Cueing hierarchy;Functional tasks;Patient/family education;Environmental controls;Cognitive reorganization;Multimodal communcation approach;Language facilitation;Compensatory techniques;Internal/external aids;SLP instruction and feedback    Potential to Achieve Goals Fair    Potential Considerations Ability to learn/carryover information    Consulted and Agree with Plan of Care Patient;Family member/caregiver    Family Member [name]           Patient will benefit from skilled therapeutic intervention in order to improve the following deficits and impairments:   Cognitive communication deficit    Problem List Patient Active Problem List   Diagnosis Date Noted  . Vascular dementia with behavior disturbance ([place]) 10/13/2019  . Vascular dementia without behavioral disturbance ([place]) 07/15/2019  . Cerebrovascular accident ([place]) 06/25/2019  . Brain fog 04/22/2019  . History of COVID-19 04/22/2019  . Essential hypertension 03/03/2019  . Hypertensive urgency 03/03/2019    Jacqueline Lennert MS, [place], CBIS  03/10/2020, 9:48 AM  [place]. [place], [name], [address] Phone: [phone]   Fax:  [phone]   Name: Jacqueline Bonilla MRN: [id number] Date of Birth: 10/02/1955

## 2020-03-14 ENCOUNTER — Other Ambulatory Visit: Payer: Self-pay

## 2020-03-14 ENCOUNTER — Ambulatory Visit: Payer: BC Managed Care – PPO | Admitting: Speech Pathology

## 2020-03-14 ENCOUNTER — Encounter: Payer: Self-pay | Admitting: Speech Pathology

## 2020-03-14 DIAGNOSIS — R41841 Cognitive communication deficit: Secondary | ICD-10-CM | POA: Diagnosis not present

## 2020-03-14 NOTE — Therapy (Signed)
[place]. [place], [name], [address] Phone: [phone]   Fax:  [phone]  Speech Language Pathology Treatment  Patient Details  Name: Jacqueline Bonilla MRN: [id number] Date of Birth: September 15, 1955 Referring Provider (SLP): [name] MD   Encounter Date: 03/14/2020   End of Session - 03/14/20 0809    Visit Number 5    Number of Visits 17    Date for SLP Re-Evaluation 05/01/20    Activity Tolerance Patient tolerated treatment well           Past Medical History:  Diagnosis Date  . Anemia   . Anxiety   . Depression   . Dysmenorrhea   . Endometriosis   . Fibroid   . Hypertension   . Microhematuria    negative workup  . Osteoporosis   . Tachycardia   . Thrombocytopenia ([place])     Past Surgical History:  Procedure Laterality Date  . BREAST BIOPSY    . CESAREAN SECTION    . hysteroscopic resection    . implantable loop recorder implant  10/21/2019   Medtronic Reveal Linq model LNQ 22 ([place] [id number]) implantable loop recorder    There were no vitals filed for this visit.   Subjective Assessment - 03/14/20 0808    Subjective "I had a great weekend! I got a ping pong table."    Patient is accompained by: Jacqueline Bonilla    Currently in Pain? No/denies                 ADULT SLP TREATMENT - 03/14/20 0853      General Information   Behavior/Cognition Alert;Cooperative;Pleasant mood      Treatment Provided   Treatment provided Cognitive-Linquistic      Cognitive-Linquistic Treatment   Treatment focused on Cognition    Skilled Treatment Provided edu on memory strategies (external vs internal), memory books, and memory card game to assist with reportedly poor visual memory at home. Pt was able to provide examples to demonstrate understanding of memory strategies.      Assessment / Recommendations / Plan   Plan Continue with current plan of care      Progression Toward Goals    Progression toward goals Progressing toward goals            SLP Education - 03/14/20 0855    Education Details Memory strategies    Person(s) Educated Patient    Methods Explanation;Demonstration;Handout    Comprehension Verbalized understanding            SLP Short Term Goals - 03/14/20 0813      SLP SHORT TERM GOAL #1   Title Pt will provide 3 areas of challenge for her given min verbal cues.    Time 2    Period Weeks    Status New      SLP SHORT TERM GOAL #2   Title Pt will provide 3 strategies to compensate for difficulty recalling information.    Time 2    Period Weeks            SLP Long Term Goals - 03/14/20 0844      SLP LONG TERM GOAL #1   Title Pt will demonstrate self- awareness of deficits to increase self-regulation.    Time 6    Period Weeks    Status New      SLP LONG TERM GOAL #2   Title Patient will demonstrate use of memory strategies to schedule  activities, recall weekly events and items to maintain safety to participate socially in functional living environment    Time [place] - 03/14/20 0810    Clinical Impression Statement Pt is a 65 yo female with a diagnosis of CVA (last year) and vascular dementia w/ behavior disturbance. Pt is accompanied by her husband, Jacqueline Bonilla. Jacqueline Bonilla called earlier today to provide with information regarding Jacqueline Bonilla's case prior to appointment with concerns for patient's agitation/emotional lability. Pt contradicted self during evaluation on "not remembering anything - blank slate" to having "no trouble with any thinking skills (memory included)." SLUMS and WAB-Bedside was completed with patient. Pt scored a 17/30 on the SLUMS and scored WFL on the WAB-R. SLUMS revealed deficits in short term memory, problem solving, and executive functioning skills. Pt husband reports emotional lability at home - easily angered and frustrated with him; however, is relatively pleasant with everyone  else. SLP rec skilled speech services to provide edu on relieving caregiver burden and to increase pt's independence with ADLs.Reiterated that vascular dementia is neurodegenerative and they are doing all the right things to navigate changes. Pt verbalized, "We are doing all the right things, I won't experience a decline."    Speech Therapy Frequency 2x / week    Duration 8 weeks    Treatment/Interventions Compensatory strategies;Cueing hierarchy;Functional tasks;Patient/Jacqueline education;Environmental controls;Cognitive reorganization;Multimodal communcation approach;Language facilitation;Compensatory techniques;Internal/external aids;SLP instruction and feedback    Potential to Achieve Goals Fair    Potential Considerations Ability to learn/carryover information    Consulted and Agree with Plan of Care Patient;Jacqueline Bonilla/caregiver    Jacqueline Bonilla Jacqueline Bonilla           Patient will benefit from skilled therapeutic intervention in order to improve the following deficits and impairments:   Cognitive communication deficit    Problem List Patient Active Problem List   Diagnosis Date Noted  . Vascular dementia with behavior disturbance ([place]) 10/13/2019  . Vascular dementia without behavioral disturbance ([place]) 07/15/2019  . Cerebrovascular accident ([place]) 06/25/2019  . Brain fog 04/22/2019  . History of COVID-19 04/22/2019  . Essential hypertension 03/03/2019  . Hypertensive urgency 03/03/2019    Jacqueline Lennert MS, [place], CBIS  03/14/2020, 8:57 AM  [place]. [place], [name], [address] Phone: [phone]   Fax:  [phone]   Name: Jacqueline Bonilla MRN: [id number] Date of Birth: Mar 16, 1955

## 2020-03-21 ENCOUNTER — Ambulatory Visit: Payer: BC Managed Care – PPO | Admitting: Speech Pathology

## 2020-03-21 ENCOUNTER — Other Ambulatory Visit: Payer: Self-pay

## 2020-03-21 ENCOUNTER — Encounter: Payer: Self-pay | Admitting: Speech Pathology

## 2020-03-21 DIAGNOSIS — R41841 Cognitive communication deficit: Secondary | ICD-10-CM | POA: Diagnosis not present

## 2020-03-21 NOTE — Therapy (Signed)
[employer] [place], [name], [address] Phone: [phone]   Fax:  [phone]  Speech Language Pathology Treatment  Patient Details  Name: Jacqueline Bonilla MRN: [id number] Date of Birth: 04-08-1955 Referring Provider (SLP): [name] MD   Encounter Date: 03/21/2020   End of Session - 03/21/20 1400    Visit Number 6    Number of Visits 17    Date for SLP Re-Evaluation 05/01/20    SLP Start Time 1315    SLP Stop Time  1400    SLP Time Calculation (min) 45 min    Activity Tolerance Patient tolerated treatment well           Past Medical History:  Diagnosis Date  . Anemia   . Anxiety   . Depression   . Dysmenorrhea   . Endometriosis   . Fibroid   . Hypertension   . Microhematuria    negative workup  . Osteoporosis   . Tachycardia   . Thrombocytopenia ([place])     Past Surgical History:  Procedure Laterality Date  . BREAST BIOPSY    . CESAREAN SECTION    . hysteroscopic resection    . implantable loop recorder implant  10/21/2019   Medtronic Reveal Linq model LNQ 22 ([place] [id number]) implantable loop recorder    There were no vitals filed for this visit.   Subjective Assessment - 03/21/20 1318    Subjective "I am doing well."    Currently in Pain? No/denies                 ADULT SLP TREATMENT - 03/21/20 1319      Treatment Provided   Treatment provided Cognitive-Linquistic      Cognitive-Linquistic Treatment   Treatment focused on Cognition    Skilled Treatment Provided attention and working memory strategies. Practiced working memory strategies with pt using repetition strategy. Had difficulty recalling while writing information down. Pt was able to recall about 4 words of information in a longer sentence.      Assessment / Recommendations / Plan   Plan Continue with current plan of care      Progression Toward Goals   Progression toward goals Progressing toward goals               SLP Short Term Goals - 03/21/20 1402      SLP SHORT TERM GOAL #1   Title Pt will provide 3 areas of challenge for her given min verbal cues.    Time 2    Period Weeks    Status Achieved      SLP SHORT TERM GOAL #2   Title Pt will provide 3 strategies to compensate for difficulty recalling information.    Time 2    Period Weeks    Status Achieved      SLP SHORT TERM GOAL #3   Title Pt and caregiver to report use of memory/attention strategies at home in daily activities to promote increased metacognition.    Time 1    Period Months    Status New            SLP Long Term Goals - 03/21/20 1405      SLP LONG TERM GOAL #1   Title Pt will demonstrate self- awareness of deficits to increase self-regulation.    Time 6    Period Weeks    Status Achieved      SLP LONG TERM GOAL #2  Title Patient will demonstrate use of memory strategies to schedule activities, recall weekly events and items to maintain safety to participate socially in functional living environment    Time 6    Period Weeks    Status On-going            Plan - 03/21/20 1400    Clinical Impression Statement Pt is a 65 yo female with a diagnosis of CVA (last year) and vascular dementia w/ behavior disturbance. SLP rec skilled speech services to provide edu on relieving caregiver burden and to increase pt's independence with ADLs. SLP to see pt x1 before recert to check-in with memory skills and determine further necessity of services.    Speech Therapy Frequency Monthly    Duration 8 weeks    Treatment/Interventions Compensatory strategies;Cueing hierarchy;Functional tasks;Patient/family education;Environmental controls;Cognitive reorganization;Multimodal communcation approach;Language facilitation;Compensatory techniques;Internal/external aids;SLP instruction and feedback    Potential to Achieve Goals Fair    Potential Considerations Ability to learn/carryover information    SLP Home Exercise  Plan Continue to determine strategies that work best for you and implement.    Consulted and Agree with Plan of Care Patient           Patient will benefit from skilled therapeutic intervention in order to improve the following deficits and impairments:   Cognitive communication deficit    Problem List Patient Active Problem List   Diagnosis Date Noted  . Vascular dementia with behavior disturbance ([place]) 10/13/2019  . Vascular dementia without behavioral disturbance ([place]) 07/15/2019  . Cerebrovascular accident ([place]) 06/25/2019  . Brain fog 04/22/2019  . History of COVID-19 04/22/2019  . Essential hypertension 03/03/2019  . Hypertensive urgency 03/03/2019    Jacqueline Bonilla, [place], CBIS  03/21/2020, 2:06 PM  [facility] [place], [name], [address] Phone: [phone]   Fax:  [phone]   Name: [name] MRN: [id number] Date of Birth: July 06, 1955

## 2020-03-28 ENCOUNTER — Ambulatory Visit: Payer: BC Managed Care – PPO | Admitting: Speech Pathology

## 2020-04-03 ENCOUNTER — Encounter: Payer: Self-pay | Admitting: Adult Health

## 2020-04-04 ENCOUNTER — Ambulatory Visit (INDEPENDENT_AMBULATORY_CARE_PROVIDER_SITE_OTHER): Payer: BC Managed Care – PPO

## 2020-04-04 DIAGNOSIS — I639 Cerebral infarction, unspecified: Secondary | ICD-10-CM

## 2020-04-05 LAB — CUP PACEART REMOTE DEVICE CHECK
Date Time Interrogation Session: 20220403190429
Implantable Pulse Generator Implant Date: 20211020

## 2020-04-11 DIAGNOSIS — G464 Cerebellar stroke syndrome: Secondary | ICD-10-CM | POA: Diagnosis not present

## 2020-04-12 ENCOUNTER — Other Ambulatory Visit: Payer: Self-pay

## 2020-04-12 ENCOUNTER — Ambulatory Visit: Payer: BC Managed Care – PPO | Admitting: Adult Health

## 2020-04-12 ENCOUNTER — Encounter: Payer: Self-pay | Admitting: Adult Health

## 2020-04-12 VITALS — BP 135/80 | HR 88 | Ht 64.0 in | Wt 115.0 lb

## 2020-04-12 DIAGNOSIS — R269 Unspecified abnormalities of gait and mobility: Secondary | ICD-10-CM

## 2020-04-12 DIAGNOSIS — F0151 Vascular dementia with behavioral disturbance: Secondary | ICD-10-CM

## 2020-04-12 DIAGNOSIS — F01518 Vascular dementia, unspecified severity, with other behavioral disturbance: Secondary | ICD-10-CM

## 2020-04-12 NOTE — Patient Instructions (Signed)
Your Plan:  Continue working with speech therapy for hopeful further improvement  Referral placed to [facility] Physical therapy - you should be called to schedule  Continue to follow with PCP for routine memory testing and monitoring     Thank you for coming to see [name] at [facility]. I hope we have been able to provide you high quality care today.  You may receive a patient satisfaction survey over the next few weeks. We would appreciate your feedback and comments so that we may continue to improve ourselves and the health of our patients.

## 2020-04-12 NOTE — Progress Notes (Addendum)
[facility]    Provider:  Dr Jaynee Eagles Requesting Provider: Horald Pollen, * Primary Care Provider:  Horald Pollen, MD  CC:  Major Neurocognitive Disorder likely vascular dementia, hx of embolic strokes  HPI:   Today, 04/12/2020, Jacqueline Bonilla returns for 6 months MRI follow-up accompanied by her husband, Jacqueline Bonilla  Per patient, memory memory has been improving especially working with neuro rehab SLP Husband sent MyChart message prior to visit reporting continued difficulty remembering events, " behavioral disturbance" with conflict such as when she is corrected but social anxiety and labial mood has been improving. She has also been doing more things independently and doing activities she previously enjoyed such as trying to play the piano MMSE today 23/30 (prior 25/30)  History of prior stroke with residual right arm weakness and gait impairment but has been slowly improving especially more recently as her and her husband work on exercises daily.  Remains on aspirin 81 mg daily for secondary stroke prevention. S/p ILR which has not shown atrial fibrillation thus far.  Blood pressure today 118/82 -monitored at home which has been stable.     History copied from [name] prior OV note for reference purposes only Interval history 10/13/2019: formal memory testing showed Major Neurocognitive Disorder.  Patient is here with her husband who also provides information.  Patient feels as though she is doing better.  Husband states that she has been diligent with constant therapy, and ongoing cognitive rehab program for 6 weeks, targeting 40 aerobic minutes per day using a combination of trampoline, elliptical, recumbent combination light weights, yoga balance classes, deep water exercises, 1 therapy, various hand strengthening devices, her dietary regimen was interrupted recently, concerned about her right shoulder area (discussed with primary care), patient  believes there is a conspiracy to keep her from resuming to drive and "get better" to mention these items in conference with provoker so husband's gave me a note instead, medications and supplements are unchanged, she is diligent with those protocols and generally upbeat, she is sleeping 7.75+ hours on a regular schedule.  Today we discussed starting a medication possibly Aricept and then going to Namenda, Aricept and Namenda are mostly used for Alzheimer's however there have been studies showing it can help with other types of dementia including vascular dementia.She saw Dr. Rayann Heman, no events, she is following up with him and they are going to follow up with him.   HPI:  Jacqueline Bonilla is a 64 y.o. female here as requested by Horald Pollen, * for "Brain Fog"  PMHx thrombocytopenia, tachycardia, osteoporosis, hypertension, dementia in her mother, depression, anxiety, anemia. Per patient: She had Covid in March. She felt like she had brain fog, cognitive decline, memory loss, she denies balance problems, tingling, balance problems, she has fallen twice. Tingling in the hands, moves legs a lot but not worse. She feels like she is getting better however. She was working up until [employer] and denies stress, depression or anxiety, no hx of psychiatric problems in life. No FHx of psychiatric issues. Mothr has dementia, she is in her 78s, she started showing signs in her 55s she is in long-term care right now. Unknown type. Generally feel happy. She fell a few times, started falling, 7 weeks ago she was walking and fell, unknown reason, she has fallen other times as well. A few weeks ago she fell again in a park, she states it was very hot both times however she tripped and fell and broke her fall  with her elbows and hands, numbness and tingling in the hands and dropping things.   Husband provides most information: 3.5 years ago husband noticed paranoia, she claimed people were conspiring against her, she  still believed in conspiracies and neighbor was involved, she said her phone was bugged. 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 No significant short term memory at the time, she still has delusions and husband used to fight with her but now she represses them. Lots of personality changes, antagonistic, beligerant, also short-term memory loss and loss of executive function, she is swearing. A gentleman was helping and she started screaming at him 3 years ago and this is not like her at all she had a big heart and would help people and offer people drinks and this is not like her. Slowly progressive. 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 No alcohol use, no cigarette smoking. Also ataxia, she leans to the right and forward and the right arm and leg flares out. She couldn't find her way out of pennies. She lost her smell and taste after covid. No rem sleep disorder.   Reviewed notes, labs and imaging from outside physicians, which showed:  TSH 1.4  Review of Systems: Patient complains of symptoms per HPI. Pertinent negatives and positives per HPI. All others negative    Social History   Socioeconomic History  . Marital status: Married    Spouse name: Not on file  . Number of children: 1  . Years of education: Not on file  . Highest education level: Not on file  Occupational History  . Occupation: accounting  Tobacco Use  . Smoking status: Never Smoker  . Smokeless tobacco: Never Used  Vaping Use  . Vaping Use: Never used  Substance and Sexual Activity  . Alcohol use: Never  . Drug use: Never  . Sexual activity: Not Currently    Partners: Male    Comment: husband vasectomy  Other Topics Concern  . 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 Cancer Father        non hodgkin lymphoma & skin  . Heart attack Maternal Grandfather   . Dementia Mother   . Polymyositis Sister     Past Medical History:  Diagnosis Date  . Anemia   . Anxiety   . Depression   . Dysmenorrhea   . Endometriosis   . Fibroid   . Hypertension   . Microhematuria    negative workup  . Osteoporosis   . Tachycardia   . Thrombocytopenia [facility])     Patient Active  Problem List   Diagnosis Date Noted  . Vascular dementia with behavior disturbance ([place]) 10/13/2019  . Vascular dementia without behavioral disturbance ([place]) 07/15/2019  . Cerebrovascular accident ([place]) 06/25/2019  . Brain fog 04/22/2019  . History of COVID-19 04/22/2019  . Essential hypertension 03/03/2019  . Hypertensive urgency 03/03/2019    Past Surgical History:  Procedure Laterality Date  . BREAST BIOPSY    . CESAREAN SECTION    . hysteroscopic resection    . implantable loop recorder implant  10/21/2019   Medtronic Reveal Linq model LNQ 22 ([place] [id number]) implantable loop recorder    Current Outpatient Medications  Medication Sig Dispense Refill  . aspirin EC 81 MG tablet Take 324 mg by mouth daily. Swallow whole.    . Cholecalciferol (VITAMIN D3 GUMMIES PO) Take 1,000 mcg by mouth daily.    Marland Kitchen losartan (COZAAR) 25 MG tablet Take 1 tablet (25 mg total) by mouth daily. 90 tablet 3  . MAGNESIUM PO Take by mouth as needed.     Marland Kitchen MELATONIN PO Take 10 mg by mouth  daily.    . Multiple Vitamins-Minerals (AIRBORNE GUMMIES) CHEW Chew by mouth daily.    Marland Kitchen OVER THE COUNTER MEDICATION daily.    . Probiotic Product (PROBIOTIC DAILY PO) Take by mouth daily.    Marland Kitchen UNABLE TO FIND Med Name: "striction D" (?) for blood pressure      No current facility-administered medications for this visit.    Allergies as of 04/12/2020 - Review Complete 04/12/2020  Allergen Reaction Noted  . Avelox [moxifloxacin]  03/11/2019  . Ciprofloxacin Hives 03/03/2019  . Floraquin [iodoquinol]  07/30/2012  . Iohexol  08/16/2005  . Levaquin [levofloxacin]  03/11/2019  . Quinolones  08/08/2017  . Shellfish allergy Itching 06/25/2019    Vitals: BP 135/80   Pulse 88   Ht 5\' 4"  (1.626 m)   Wt 115 lb (52.2 kg)   LMP 01/02/2003   BMI 19.74 kg/m  Last Weight:  Wt Readings from Last 1 Encounters:  04/12/20 115 lb (52.2 kg)   Last Height:   Ht Readings from Last 1 Encounters:  04/12/20 5\' 4"  (1.626 m)     Neuro: Stable, no changes Detailed Neurologic Exam  Speech:    Speech is normal; fluent and spontaneous with normal comprehension.  Cognition: MMSE - Mini Mental State Exam 04/12/2020 06/11/2019  Orientation to time 4 5  Orientation to Place 5 5  Registration 3 3  Attention/ Calculation 2 3  Recall 1 2  Language- name 2 objects 2 2  Language- repeat 1 1  Language- follow 3 step command 3 3  Language- read & follow direction 1 1  Write a sentence 1 0  Copy design 0 0  Total score 23 25   Cranial Nerves:    The pupils are equal, round, and pinpoint, could not visualize fundi due to small pupils. Visual fields are full to finger confrontation. Extraocular movements are intact. Trigeminal sensation is intact and the muscles of mastication are normal. The face is symmetric. The palate elevates in the midline. Hearing intact. Voice is normal. Shoulder shrug is normal. The tongue has normal motion without fasciculations.   Coordination:    Mild RUE ataxia out of  proportion of weakness. Normal heel-to-shin  Gait:    Heel-toe and tandem gait with minimal imbalance, good stride and arm swing, not shuffling  Motor Observation:    No asymmetry, no atrophy, and no involuntary movements noted. Tone:    Increased muscle  tone vs paratonia  Posture:    Posture is normal. normal erect    Strength: Mild RUE prox weakness, very slight weak grip, otherwise strength is V/V in the upper and lower limbs.      Sensation: intact to LT     Reflex Exam:  DTR's:    Deep tendon reflexes in the upper and lower extremities are brisk bilaterally, right >> left Toes:  right upgoing, left downgoing         Assessment/Plan:  65 y.o. female here as requested by Horald Pollen, * for "Brain Fog"  PMHx of stroke,  thrombocytopenia, tachycardia, osteoporosis, hypertension, dementia in her mother, depression, anxiety, anemia. Per patient: Patient reports symptoms of "brain fog" since March when she had Covid however her husband today says it goes back longer than that, approximately 3.5 years.    Major neurocognitive disorder -Likely vascular etiology with history of strokes on imaging -Subjectively stable if not improving -encourage continued participation with speech therapy -MMSE 23/30 (prior 25/30) -Discussed importance of memory exercises, maintaining stroke risk factors, healthy diet, adequate sleep and routine exercise to help prevent or slow worsening -Husband requested BDNF testing -further discussed with [name] - not a testing we do in this office - if they wish to pursue, can refer to [facility] who may do this type of testing.  Husband and patient verbalized understanding  Gait impairment -Referral placed to [facility] physical therapy as requested by husband for further strengthening   Overall stable from stroke standpoint and recommend follow-up on an as-needed basis     CC:  [facility] provider: Dr. Laurance Flatten, Ines Bloomer, MD   I spent 30 minutes of face-to-face and non-face-to-face time with patient and husband.  This included previsit chart review, lab review, study review, order entry, electronic health record documentation, patient education and discussion regarding completion and review of MMSE, interventions to prevent worsening, gait impairment and further intervention and answered all other questions to patient and husband satisfaction  Jacqueline Bonilla, AGNP-BC  [facility] [address] [place], [place] [address]  Phone [phone] Fax [phone] Note: This document was prepared with digital dictation and possible smart phrase technology. Any transcriptional errors that result from this process are unintentional.    agree with history, physical, neuro exam,assessment and plan as stated.     Sarina Ill, MD [facility]

## 2020-04-13 NOTE — Progress Notes (Signed)
Carelink Summary Report / Loop Recorder 

## 2020-04-19 DIAGNOSIS — G464 Cerebellar stroke syndrome: Secondary | ICD-10-CM | POA: Diagnosis not present

## 2020-04-27 ENCOUNTER — Ambulatory Visit: Payer: BC Managed Care – PPO | Admitting: Speech Pathology

## 2020-04-28 DIAGNOSIS — G464 Cerebellar stroke syndrome: Secondary | ICD-10-CM | POA: Diagnosis not present

## 2020-05-05 ENCOUNTER — Ambulatory Visit (INDEPENDENT_AMBULATORY_CARE_PROVIDER_SITE_OTHER): Payer: BC Managed Care – PPO

## 2020-05-05 DIAGNOSIS — I639 Cerebral infarction, unspecified: Secondary | ICD-10-CM

## 2020-05-09 LAB — CUP PACEART REMOTE DEVICE CHECK
Date Time Interrogation Session: 20220506201003
Implantable Pulse Generator Implant Date: 20211020

## 2020-05-25 NOTE — Progress Notes (Signed)
Carelink Summary Report / Loop Recorder 

## 2020-05-26 ENCOUNTER — Encounter: Payer: Medicare Other | Attending: Psychology | Admitting: Psychology

## 2020-05-26 ENCOUNTER — Encounter: Payer: Self-pay | Admitting: Psychology

## 2020-05-26 ENCOUNTER — Other Ambulatory Visit: Payer: Self-pay

## 2020-05-26 DIAGNOSIS — F0151 Vascular dementia with behavioral disturbance: Secondary | ICD-10-CM | POA: Diagnosis not present

## 2020-05-26 DIAGNOSIS — F01A18 Vascular dementia, mild, with other behavioral disturbance: Secondary | ICD-10-CM

## 2020-05-26 NOTE — Progress Notes (Signed)
NEUROBEHAVIORAL STATUS EXAM   Name: TALISHIA BETZLER DOB: 1955/07/12 MRN: [id number] DOS: 05/30/2020  Reason for Referral:  MANESSA BULEY is a 65 y.o. female who presents for 61-month follow up appointment after completing baseline neuropsychological evaluation on 08/27/19 with this provider.  She was originally referred by [name] as part of comprehensive memory work up to evaluate her present level of cognitive functioning and aid in differential diagnosis and treatment planning. This patient is accompanied in the office by her husband who supplements the history.  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  Patient's husband dates the onset of personality change approximately 3 years ago and revealed that she became severely paranoid at work and developed the belief that her co-workers were conspiring against her.   HPI See previous neuropsychological evaluation on 08/27/19 for comprehensive review. The following portions of the patient's history were reviewed and updated as appropriate: allergies, current medications, past family history, past medical history, past social history, past surgical history and problem list.   Mrs. Weist presents for 76-month follow up interview after completing full neuropsychological work up in August 2021. Her husband is also in attendance and serves as an informant.  Patient reports doing well overall with regards to rehab/recovery and adjusting to life post stroke. She describes being very physically and cognitively active during the week and sticking to a regular schedule. She regularly uses [employer] and other computer/app based programs with good effect. She recently began playing ping-pong again and is even "jumping rope on a trampoline". She reportedly is challenging herself daily to perform various fine motor tasks with her dominant hand and noticing slow,  but gradual improvement. She has even started to drive with husband in empty parking lots and closed campuses; improving per husbands report. Husband challenges/tests her memory, sensory perception, spatial awareness, motor skills, etc. whenever reasonable and appropriate. Patient continues to have poor balance and somewhat unsteady on feet. Acknowledged having recent fall on Mother's day but thankfully did not get significantly injured or hit head; husband described "minor set back". Articulation is reportedly improving and she is using shorter sentences and other breathing strategies to circumvent word finding difficulties.  Patient mentions that she still can't read musical notation. She acknowledged having residual trouble recalling recent information but  did not appear concerned about long-term memory; admitted to occasionally misplacing things but denied other problems.  Per husband's report, patient continues to display well formed delusions on occasion. Husband purportedly doing better at noticing initial signs/symptoms and using behavioral strategies to help (e.g., redirection, reassurance, reconsidering, etc.). Other than this,  husband describes mood as generally stable without depressive episodes or significant anxiety. She sleeps 7-8 hours a night and denies REM sleep behavioral disorder. With regards to memory, husband states that she seems to be tracking conversations better and recalling more details as well.   Husband and patient express apprehension about adding any new medications (I.e., Aricept) to her regimen as current labs appear stable and she is functioning well.   Previous Neuropsychological Evaluation (08/27/19):   SUMMARY & IMPRESSION: Mrs. Minogue's scores on several measures that are used to assess the validity of the test results were within normal limits. Behaviorally, she appeared motivated and persisted well. Overall, the current results are likely a valid estimate of  her current capabilities. She did score somewhat high on validity scales related to defensiveness and response consistency on an objective  personality measure; her profile was interpreted with caution. It could be that cognitive (e.g., inattention) and/or emotional factors (e.g. anxiety and depression) may have contributed toward an inability to provide consistent responses.   Mrs. Rizzo's performance on neuropsychological testing appeared generally valid. On a battery of neuropsychological tests, she showed moderate cognitive impairment overall. Performance was relatively preserved for more basic attention, language, and delayed memory ability. However, she showed severe impairment in immediate memory, attention/mental tracking/working memory, verbal fluency, processing speed, visuospatial/constructional ability, executive functions, and fine motor coordination (worse on dominant side). Behaviorally, she showed signs of executive dysfunction including impulsivity and suspiciousness. Despite this, she showed some insight into her weaknesses and consequences of her decisions and performance on a judgment task was strong.  With regard to personality functioning, she tends to minimize or deny negative impact on mood or mental health and is more likely to blame primary problems on physiological or medical symptoms.   Functionally, the patient's marked deficits in visuospatial/constructional ability, encoding and immediate memory, as well as most executive functions can be anticipated to cause difficulties in daily functioning.  For example, she may misplace important household items like keys or glasses, forget to lock doors, miss medical appointments, forget to take medication (or forget that she has already taken her medication), etc.  Handling of personal and financial affairs may also be problematic and warrants supervision.  Deficiencies in executive functioning suggest that she may have difficulty with  planning, organizing, following through with daily life activities, and coming up with appropriate solutions to problem situations.  Increased apathy and chronic inertia, in which the patient may spend days doing very little, are also common behavioral manifestations of executive dysfunction. Her husband is currently helping out by structuring physical fitness and diet routines during the week and provided assistance with daily activities as needed. The patient is fully dependant on husband for complex (e.g., driving, managing finances and medical appointments) and most instrumental tasks such as cooking, household chores, gardening and yard work, grocery shopping, etc.).   We now have a strong baseline for the patient going forward if there are any progressive changes noted. Initially, the first evaluation is utilized to compare performance to a normative population. 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 Anemia   . Anxiety   . Depression   . Dysmenorrhea   . Endometriosis   . Fibroid   . Hypertension   . Microhematuria    negative workup  . Osteoporosis   . Tachycardia   . Thrombocytopenia ([place])    Current Medications:  Outpatient Encounter Medications as of 05/26/2020  Medication Sig  . aspirin EC 81 MG tablet Take 324 mg by mouth daily. Swallow whole.  . Cholecalciferol (VITAMIN D3 GUMMIES PO) Take 1,000 mcg by mouth daily.  Marland Kitchen MAGNESIUM PO Take by mouth as needed.   Marland Kitchen MELATONIN PO Take 10 mg by mouth daily.  . Multiple Vitamins-Minerals (AIRBORNE GUMMIES) CHEW Chew by mouth daily.  Marland Kitchen OVER THE COUNTER MEDICATION daily.  . Probiotic Product (PROBIOTIC DAILY PO) Take by mouth  daily.  Marland Kitchen UNABLE TO FIND Med Name: "striction D" (?) for blood pressure    No facility-administered encounter medications on file as of 05/26/2020.    Objective:  Physical Exam Neurological:     Mental Status: She is alert and oriented to person, place, and time.     Sensory: Sensory deficit present.     Motor: Weakness present.     Coordination: Coordination abnormal.     Gait: Gait abnormal.  Psychiatric:        Attention and Perception: She is inattentive. She does not perceive auditory hallucinations.        Mood and Affect: Affect normal. Mood is anxious (mild). Mood is not depressed.        Speech: Speech is tangential.        Behavior: Behavior is slowed. Behavior is cooperative.        Thought Content: Thought content is paranoid and delusional (not currently active). Thought content does not include homicidal or suicidal ideation. Thought content does not include homicidal or suicidal plan.        Cognition and Memory: Cognition is impaired. Memory is impaired. She exhibits impaired recent memory.        Judgment: Judgment is impulsive and inappropriate.   Lab Review:  not applicable  Assessment:   Major neurocognitive disorder, due to vascular disease, with behavioral disturbance, mild ([facility])   60 minutes spent face-to-face with patient completing neurobehavioral status exam, integrating medical records/clinical data and completing this report. [id number] unit; [id number]   Plan:   TESTING: There is no medical necessity to proceed with neuropsychological assessment as there has not been a change in cognitive functioning since previous evaluation. Husband describes patient as doing remarkably well in her rehab program and recovery overall. They are not concerned at present for worsening memory and/or behavioral disturbance; patient appears stable.   PLAN:  No follow up required at this time. Recommended that patient participate in cognitively stimulating activities from the  website listed below for additional therapeutic value:  [email or web address]-[email or web address]

## 2020-05-27 ENCOUNTER — Encounter: Payer: BC Managed Care – PPO | Admitting: Psychology

## 2020-06-01 DIAGNOSIS — G464 Cerebellar stroke syndrome: Secondary | ICD-10-CM | POA: Diagnosis not present

## 2020-06-06 ENCOUNTER — Ambulatory Visit (INDEPENDENT_AMBULATORY_CARE_PROVIDER_SITE_OTHER): Payer: Medicare HMO

## 2020-06-06 DIAGNOSIS — I6349 Cerebral infarction due to embolism of other cerebral artery: Secondary | ICD-10-CM | POA: Diagnosis not present

## 2020-06-09 DIAGNOSIS — R110 Nausea: Secondary | ICD-10-CM | POA: Diagnosis not present

## 2020-06-09 DIAGNOSIS — R410 Disorientation, unspecified: Secondary | ICD-10-CM | POA: Diagnosis not present

## 2020-06-09 DIAGNOSIS — Z8673 Personal history of transient ischemic attack (TIA), and cerebral infarction without residual deficits: Secondary | ICD-10-CM | POA: Diagnosis not present

## 2020-06-09 DIAGNOSIS — Z79899 Other long term (current) drug therapy: Secondary | ICD-10-CM | POA: Diagnosis not present

## 2020-06-09 DIAGNOSIS — I10 Essential (primary) hypertension: Secondary | ICD-10-CM | POA: Diagnosis not present

## 2020-06-09 DIAGNOSIS — R000 Tachycardia, unspecified: Secondary | ICD-10-CM | POA: Diagnosis not present

## 2020-06-09 LAB — CUP PACEART REMOTE DEVICE CHECK
Date Time Interrogation Session: 20220608190416
Implantable Pulse Generator Implant Date: 20211020

## 2020-06-15 DIAGNOSIS — G464 Cerebellar stroke syndrome: Secondary | ICD-10-CM | POA: Diagnosis not present

## 2020-06-24 ENCOUNTER — Other Ambulatory Visit: Payer: Self-pay

## 2020-06-24 ENCOUNTER — Observation Stay (HOSPITAL_COMMUNITY): Payer: Medicare HMO

## 2020-06-24 ENCOUNTER — Encounter (HOSPITAL_COMMUNITY): Payer: Self-pay

## 2020-06-24 ENCOUNTER — Emergency Department (HOSPITAL_COMMUNITY): Payer: Medicare HMO

## 2020-06-24 ENCOUNTER — Inpatient Hospital Stay (HOSPITAL_COMMUNITY)
Admission: EM | Admit: 2020-06-24 | Discharge: 2020-06-28 | DRG: 065 | Disposition: A | Discharge status: Rehabilitation Facility | Payer: Medicare HMO | Attending: Internal Medicine | Admitting: Internal Medicine

## 2020-06-24 DIAGNOSIS — Z8616 Personal history of COVID-19: Secondary | ICD-10-CM

## 2020-06-24 DIAGNOSIS — G8194 Hemiplegia, unspecified affecting left nondominant side: Secondary | ICD-10-CM | POA: Diagnosis not present

## 2020-06-24 DIAGNOSIS — R2689 Other abnormalities of gait and mobility: Secondary | ICD-10-CM | POA: Diagnosis not present

## 2020-06-24 DIAGNOSIS — W19XXXA Unspecified fall, initial encounter: Secondary | ICD-10-CM | POA: Diagnosis not present

## 2020-06-24 DIAGNOSIS — I639 Cerebral infarction, unspecified: Secondary | ICD-10-CM | POA: Diagnosis present

## 2020-06-24 DIAGNOSIS — Z79899 Other long term (current) drug therapy: Secondary | ICD-10-CM | POA: Diagnosis not present

## 2020-06-24 DIAGNOSIS — F419 Anxiety disorder, unspecified: Secondary | ICD-10-CM | POA: Diagnosis present

## 2020-06-24 DIAGNOSIS — R29810 Facial weakness: Secondary | ICD-10-CM | POA: Diagnosis present

## 2020-06-24 DIAGNOSIS — I6381 Other cerebral infarction due to occlusion or stenosis of small artery: Principal | ICD-10-CM | POA: Diagnosis present

## 2020-06-24 DIAGNOSIS — R29703 NIHSS score 3: Secondary | ICD-10-CM | POA: Diagnosis present

## 2020-06-24 DIAGNOSIS — I10 Essential (primary) hypertension: Secondary | ICD-10-CM | POA: Diagnosis present

## 2020-06-24 DIAGNOSIS — M810 Age-related osteoporosis without current pathological fracture: Secondary | ICD-10-CM | POA: Diagnosis present

## 2020-06-24 DIAGNOSIS — I6340 Cerebral infarction due to embolism of unspecified cerebral artery: Secondary | ICD-10-CM | POA: Diagnosis not present

## 2020-06-24 DIAGNOSIS — Z888 Allergy status to other drugs, medicaments and biological substances status: Secondary | ICD-10-CM | POA: Diagnosis not present

## 2020-06-24 DIAGNOSIS — I6389 Other cerebral infarction: Secondary | ICD-10-CM | POA: Diagnosis not present

## 2020-06-24 DIAGNOSIS — I69354 Hemiplegia and hemiparesis following cerebral infarction affecting left non-dominant side: Secondary | ICD-10-CM | POA: Diagnosis not present

## 2020-06-24 DIAGNOSIS — I69319 Unspecified symptoms and signs involving cognitive functions following cerebral infarction: Secondary | ICD-10-CM | POA: Diagnosis not present

## 2020-06-24 DIAGNOSIS — Z8249 Family history of ischemic heart disease and other diseases of the circulatory system: Secondary | ICD-10-CM

## 2020-06-24 DIAGNOSIS — I69351 Hemiplegia and hemiparesis following cerebral infarction affecting right dominant side: Secondary | ICD-10-CM

## 2020-06-24 DIAGNOSIS — Z20822 Contact with and (suspected) exposure to covid-19: Secondary | ICD-10-CM | POA: Diagnosis not present

## 2020-06-24 DIAGNOSIS — R29898 Other symptoms and signs involving the musculoskeletal system: Secondary | ICD-10-CM | POA: Diagnosis not present

## 2020-06-24 DIAGNOSIS — R531 Weakness: Secondary | ICD-10-CM

## 2020-06-24 DIAGNOSIS — Z91041 Radiographic dye allergy status: Secondary | ICD-10-CM | POA: Diagnosis not present

## 2020-06-24 DIAGNOSIS — I63423 Cerebral infarction due to embolism of bilateral anterior cerebral arteries: Secondary | ICD-10-CM | POA: Diagnosis not present

## 2020-06-24 DIAGNOSIS — Z8673 Personal history of transient ischemic attack (TIA), and cerebral infarction without residual deficits: Secondary | ICD-10-CM | POA: Diagnosis not present

## 2020-06-24 DIAGNOSIS — I69398 Other sequelae of cerebral infarction: Secondary | ICD-10-CM | POA: Diagnosis not present

## 2020-06-24 DIAGNOSIS — I63521 Cerebral infarction due to unspecified occlusion or stenosis of right anterior cerebral artery: Secondary | ICD-10-CM | POA: Diagnosis not present

## 2020-06-24 DIAGNOSIS — Z881 Allergy status to other antibiotic agents status: Secondary | ICD-10-CM | POA: Diagnosis not present

## 2020-06-24 DIAGNOSIS — D696 Thrombocytopenia, unspecified: Secondary | ICD-10-CM | POA: Diagnosis not present

## 2020-06-24 DIAGNOSIS — Z7982 Long term (current) use of aspirin: Secondary | ICD-10-CM

## 2020-06-24 DIAGNOSIS — E785 Hyperlipidemia, unspecified: Secondary | ICD-10-CM | POA: Diagnosis not present

## 2020-06-24 DIAGNOSIS — F32A Depression, unspecified: Secondary | ICD-10-CM | POA: Diagnosis present

## 2020-06-24 DIAGNOSIS — Z91013 Allergy to seafood: Secondary | ICD-10-CM

## 2020-06-24 DIAGNOSIS — D649 Anemia, unspecified: Secondary | ICD-10-CM | POA: Diagnosis not present

## 2020-06-24 DIAGNOSIS — I63321 Cerebral infarction due to thrombosis of right anterior cerebral artery: Secondary | ICD-10-CM | POA: Diagnosis not present

## 2020-06-24 DIAGNOSIS — R2990 Unspecified symptoms and signs involving the nervous system: Secondary | ICD-10-CM

## 2020-06-24 DIAGNOSIS — I69392 Facial weakness following cerebral infarction: Secondary | ICD-10-CM | POA: Diagnosis not present

## 2020-06-24 DIAGNOSIS — F0150 Vascular dementia without behavioral disturbance: Secondary | ICD-10-CM | POA: Diagnosis present

## 2020-06-24 DIAGNOSIS — R29818 Other symptoms and signs involving the nervous system: Secondary | ICD-10-CM | POA: Diagnosis not present

## 2020-06-24 LAB — ECHOCARDIOGRAM COMPLETE
Area-P 1/2: 3.12 cm2
Height: 64 in
S' Lateral: 2.6 cm
Weight: 1848.34 [oz_av]

## 2020-06-24 LAB — I-STAT CHEM 8, ED
BUN: 19 mg/dL (ref 8–23)
Calcium, Ion: 1.22 mmol/L (ref 1.15–1.40)
Chloride: 102 mmol/L (ref 98–111)
Creatinine, Ser: 1.1 mg/dL — ABNORMAL HIGH (ref 0.44–1.00)
Glucose, Bld: 95 mg/dL (ref 70–99)
HCT: 42 % (ref 36.0–46.0)
Hemoglobin: 14.3 g/dL (ref 12.0–15.0)
Potassium: 4.1 mmol/L (ref 3.5–5.1)
Sodium: 139 mmol/L (ref 135–145)
TCO2: 26 mmol/L (ref 22–32)

## 2020-06-24 LAB — CBC
HCT: 40.4 % (ref 36.0–46.0)
Hemoglobin: 13.1 g/dL (ref 12.0–15.0)
MCH: 29.9 pg (ref 26.0–34.0)
MCHC: 32.4 g/dL (ref 30.0–36.0)
MCV: 92.2 fL (ref 80.0–100.0)
Platelets: 214 10*3/uL (ref 150–400)
RBC: 4.38 MIL/uL (ref 3.87–5.11)
RDW: 13 % (ref 11.5–15.5)
WBC: 6.3 10*3/uL (ref 4.0–10.5)
nRBC: 0 % (ref 0.0–0.2)

## 2020-06-24 LAB — COMPREHENSIVE METABOLIC PANEL
ALT: 19 U/L (ref 0–44)
AST: 26 U/L (ref 15–41)
Albumin: 3.7 g/dL (ref 3.5–5.0)
Alkaline Phosphatase: 62 U/L (ref 38–126)
Anion gap: 7 (ref 5–15)
BUN: 17 mg/dL (ref 8–23)
CO2: 28 mmol/L (ref 22–32)
Calcium: 9.4 mg/dL (ref 8.9–10.3)
Chloride: 103 mmol/L (ref 98–111)
Creatinine, Ser: 1.05 mg/dL — ABNORMAL HIGH (ref 0.44–1.00)
GFR, Estimated: 58 mL/min — ABNORMAL LOW (ref >60)
Glucose, Bld: 97 mg/dL (ref 70–99)
Potassium: 4.2 mmol/L (ref 3.5–5.1)
Sodium: 138 mmol/L (ref 135–145)
Total Bilirubin: 0.7 mg/dL (ref 0.3–1.2)
Total Protein: 7.4 g/dL (ref 6.5–8.1)

## 2020-06-24 LAB — DIFFERENTIAL
Abs Immature Granulocytes: 0.03 10*3/uL (ref 0.00–0.07)
Basophils Absolute: 0.1 10*3/uL (ref 0.0–0.1)
Basophils Relative: 1 %
Eosinophils Absolute: 0.1 10*3/uL (ref 0.0–0.5)
Eosinophils Relative: 1 %
Immature Granulocytes: 1 %
Lymphocytes Relative: 17 %
Lymphs Abs: 1.1 10*3/uL (ref 0.7–4.0)
Monocytes Absolute: 0.5 10*3/uL (ref 0.1–1.0)
Monocytes Relative: 8 %
Neutro Abs: 4.5 10*3/uL (ref 1.7–7.7)
Neutrophils Relative %: 72 %

## 2020-06-24 LAB — PROTIME-INR
INR: 1 (ref 0.8–1.2)
Prothrombin Time: 12.9 seconds (ref 11.4–15.2)

## 2020-06-24 LAB — APTT: aPTT: 25 seconds (ref 24–36)

## 2020-06-24 LAB — SARS CORONAVIRUS 2 (TAT 6-24 HRS): SARS Coronavirus 2: NEGATIVE

## 2020-06-24 LAB — CBG MONITORING, ED: Glucose-Capillary: 80 mg/dL (ref 70–99)

## 2020-06-24 IMAGING — MR MR MRA HEAD W/O CM
3 series · 13 of 48 positions shown · non-contrast
Comparison: And a CT code stroke. MRI head [DATE]. CTA KAMRUL

CLINICAL DATA: Neuro deficit, acute stroke suspected

EXAM:
MRI HEAD WITHOUT CONTRAST
MRA HEAD WITHOUT CONTRAST
TECHNIQUE: Multiplanar, multi-echo pulse sequences of the brain and surrounding
structures were acquired without intravenous contrast. Angiographic
images of the Circle of Willis were acquired using MRA technique
without intravenous contrast.

[Series 4: (id) mt fs · axial · 1.4mm · 0.43mm/px · z∈[-51,+35]mm · 11 of 136 slices shown]
[im 7/136]
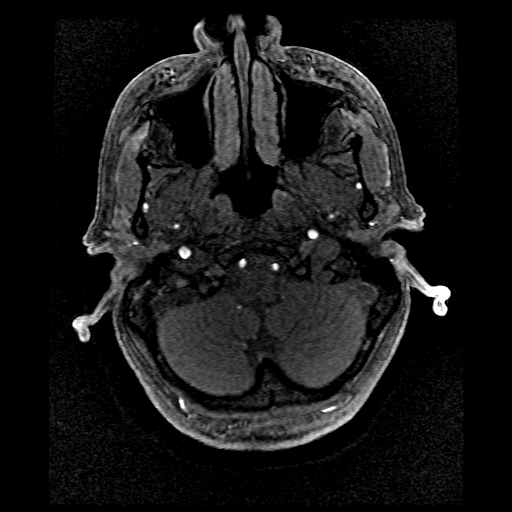
[im 22/136]
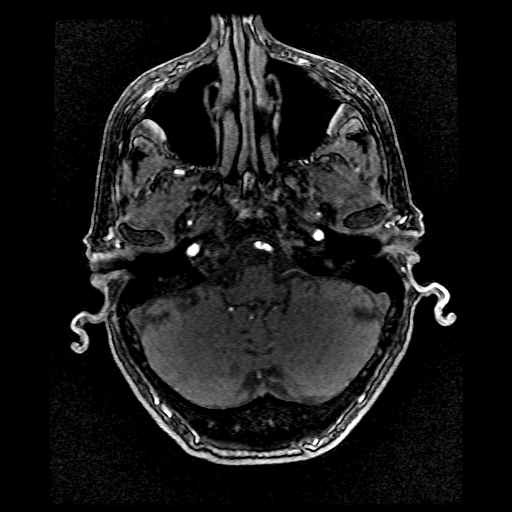
[im 25/136]
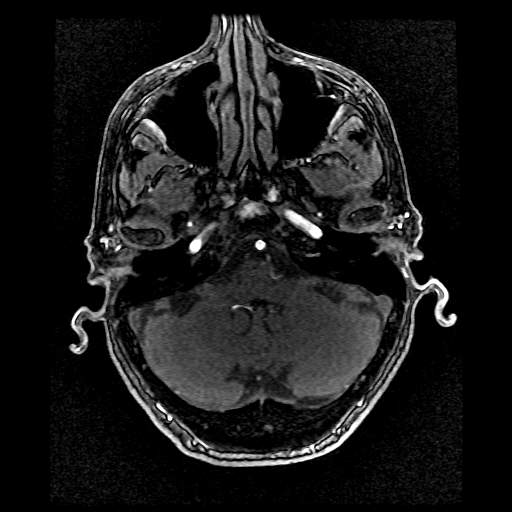
[im 43/136]
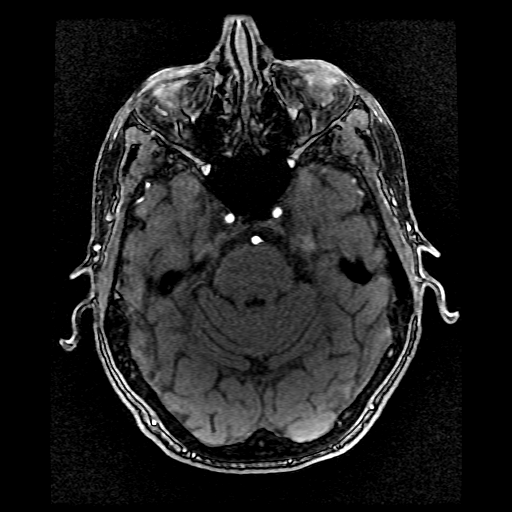
[im 61/136]
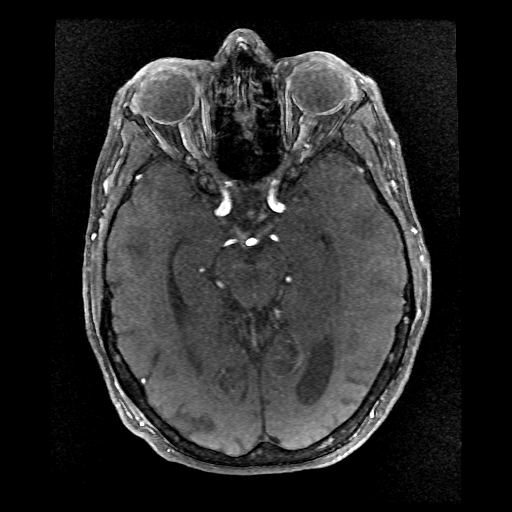
[im 70/136]
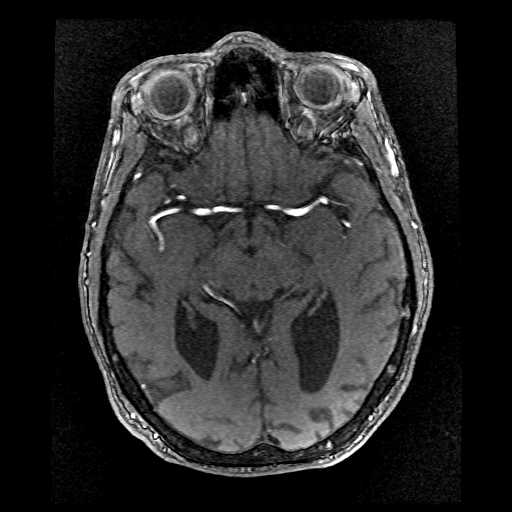
[im 76/136]
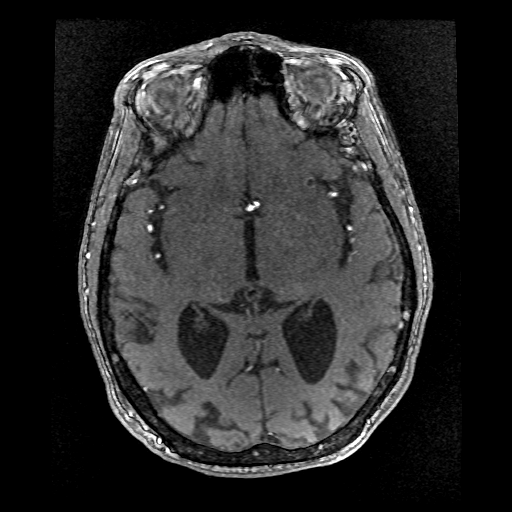
[im 94/136]
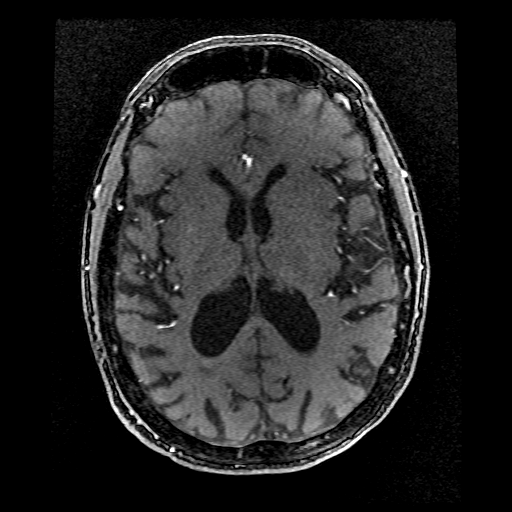
[im 112/136]
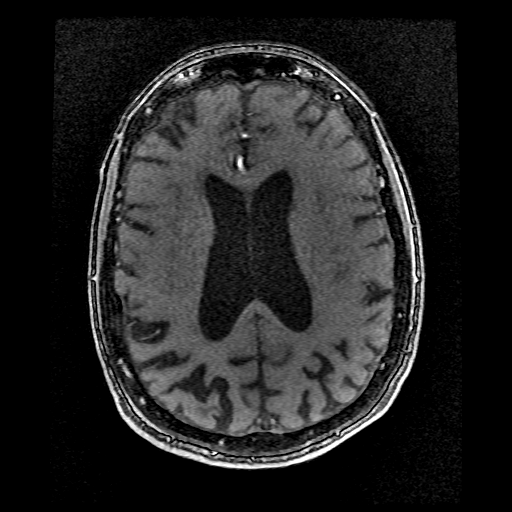
[im 115/136]
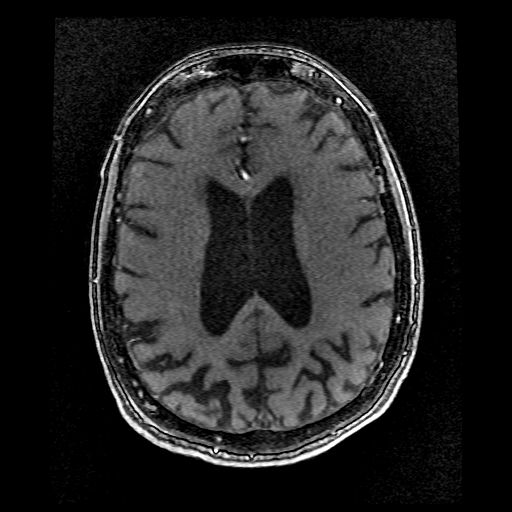
[im 130/136]
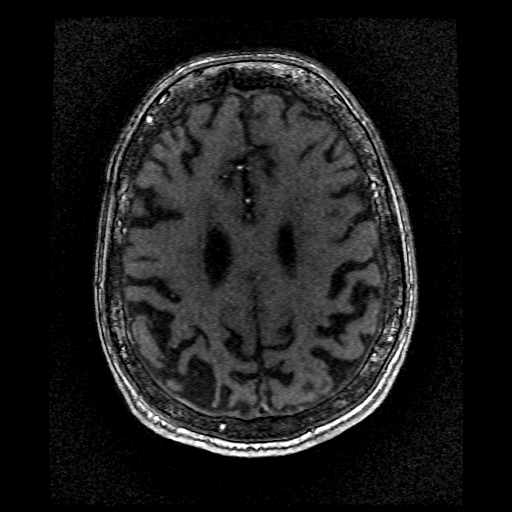

[Series 403: processed images · axial · 1.4mm · 0.21mm/px · 1 of 1 slices shown (1 of 2)]
[im 1/1]
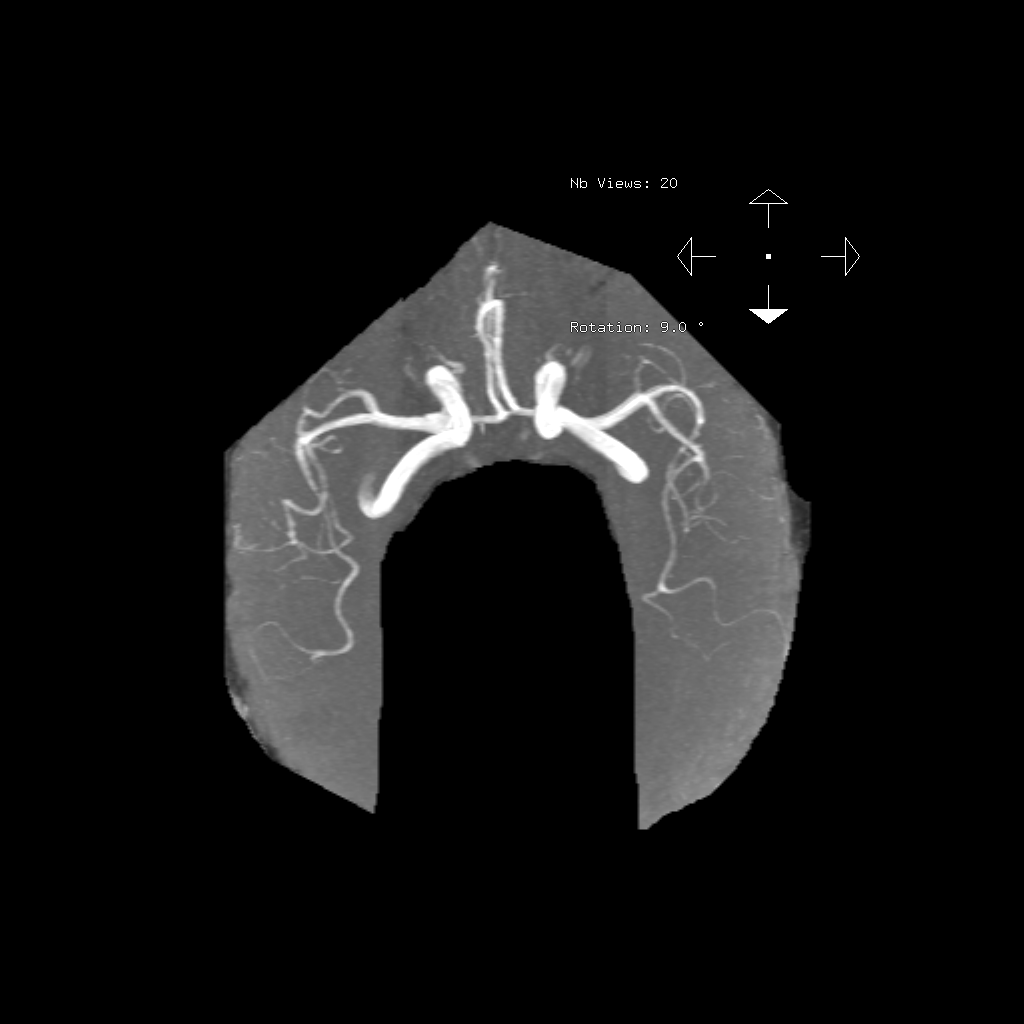

[Series 405: processed images · axial · 1.4mm · 0.21mm/px · 1 of 1 slices shown (2 of 2)]
[im 1/1]
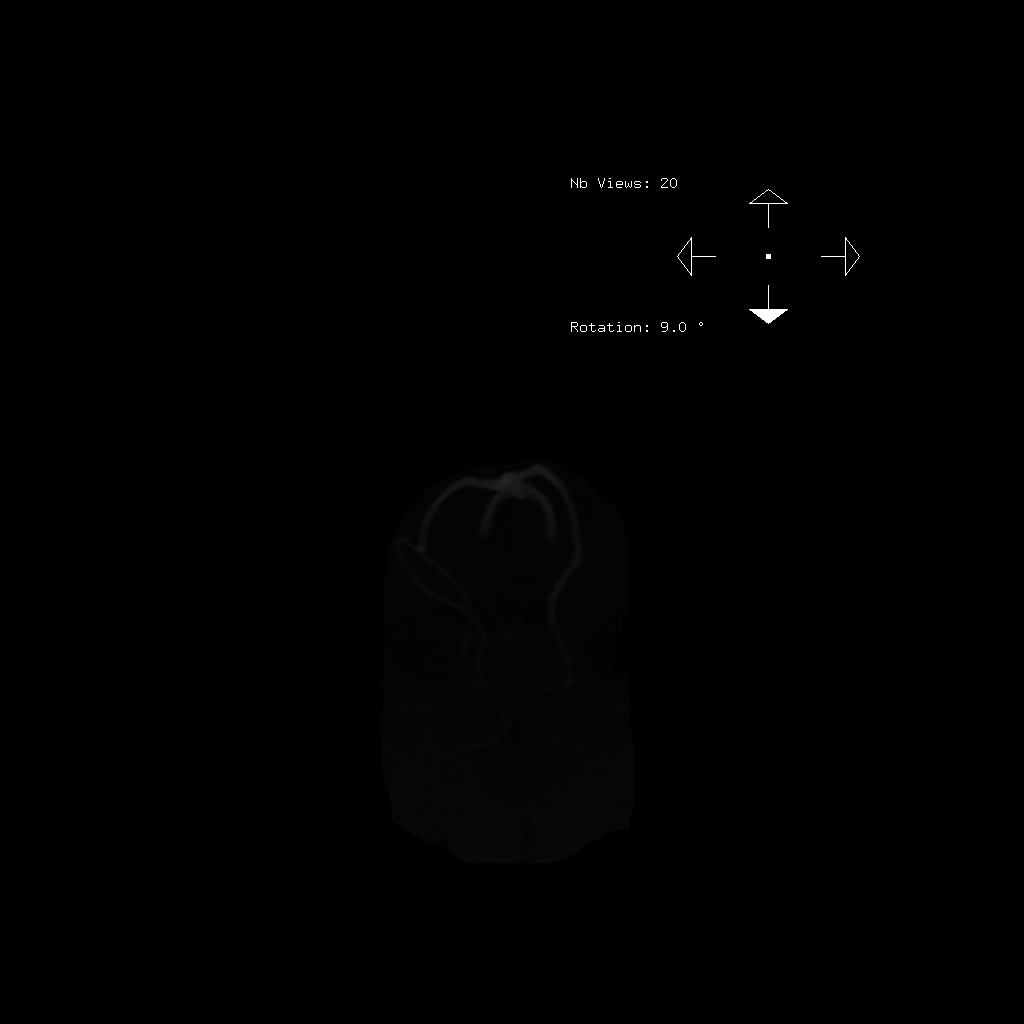

[13 of 48 positions shown; findings below may reference images not displayed]

FINDINGS: MRI HEAD FINDINGS

Brain: Punctate focus of restricted diffusion in the high left
frontal cortex (series 3, image 38). No significant edema or mass
effect. Additional punctate areas of faint DWI signal in bilateral
precentral gyri (series 3, images 37 and 39). Redemonstrated
multiple remote cortical infarcts in bilateral frontal parietal and
occipital lobes. Multiple bilateral cerebellar lacunar infarcts.
Moderate scattered patchy confluent T2/FLAIR hyperintensities within
the white matter most likely related to chronic microvascular
ischemic disease. No hydrocephalus. Age advanced moderate cerebral
atrophy with ex vacuo ventricular dilation. No acute hemorrhage. No
mass lesion or abnormal mass effect. No extra-axial fluid
collection.

Vascular: See below.

Skull and upper cervical spine: Normal marrow signal.

Sinuses/Orbits: Clear sinuses.  Unremarkable orbits.

Other: No sizable mastoid effusions.

MRA HEAD FINDINGS

Anterior circulation: Bilateral ICAs, MCAs, and ACAs are patent
without flow-limiting proximal stenosis. No aneurysm identified.

Posterior circulation: The visualized intradural vertebral arteries,
basilar artery and posterior cerebral arteries are patent without
flow limiting proximal stenosis. No aneurysm identified.
IMPRESSION: MRI:

1. Punctate acute infarct in the high left frontal cortex.
Additional faint punctate areas of DWI signal in bilateral
precentral gyri are suspicious for additional acute or subacute
infarcts, but could potentially represent artifact. Given potential
involvement of multiple vascular territories, consider a central
embolic etiology.
2. Multiple remote cortical infarcts in bilateral frontal, parietal,
and occipital lobes and multiple bilateral cerebellar lacunar
infarcts.
3. Moderate atrophy.

MRA:

No large vessel occlusion or proximal flow limiting stenosis.

## 2020-06-24 IMAGING — MR MR HEAD W/O CM
9 of 10 series · 37 of 48 positions shown · non-contrast
Comparison: And a CT code stroke. MRI head [DATE]. CTA KAMRUL

CLINICAL DATA: Neuro deficit, acute stroke suspected

EXAM:
MRI HEAD WITHOUT CONTRAST
MRA HEAD WITHOUT CONTRAST
TECHNIQUE: Multiplanar, multi-echo pulse sequences of the brain and surrounding
structures were acquired without intravenous contrast. Angiographic
images of the Circle of Willis were acquired using MRA technique
without intravenous contrast.

[Series 3: DWI · axial · 3.0mm · 1.09mm/px · z∈[-46,+96]mm · 11 of 98 slices shown (1 of 4)]
[im 1/98]
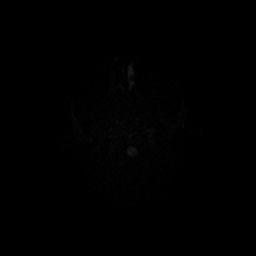
[im 10/98]
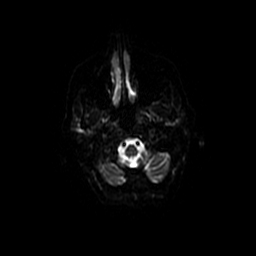
[im 20/98]
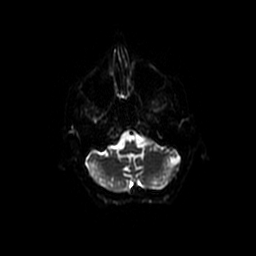
[im 30/98]
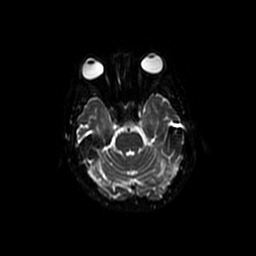
[im 39/98]
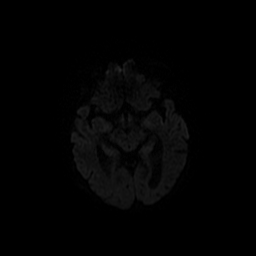
[im 49/98]
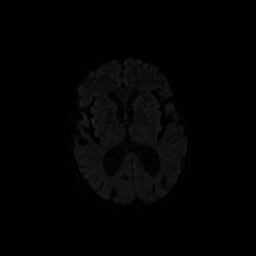
[im 59/98]
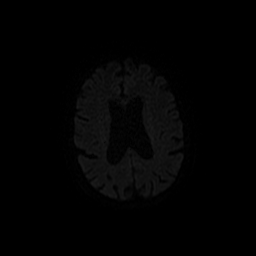
[im 68/98]
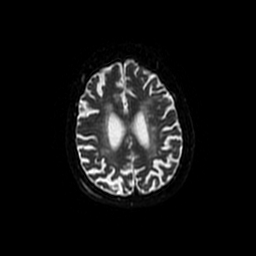
[im 78/98]
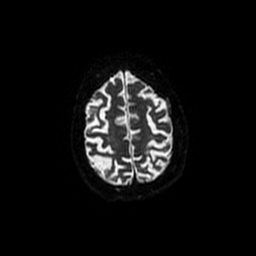
[im 88/98]
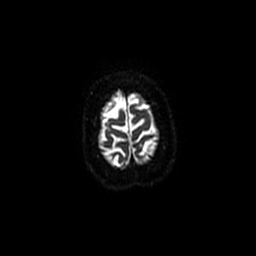
[im 98/98]
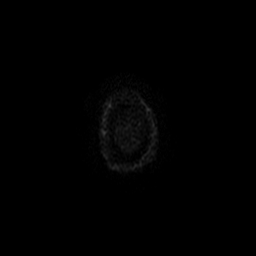

[Series 5: DWI · coronal · 5.0mm · 1.09mm/px · 7 of 74 slices shown (2 of 4)]
[im 1/74]
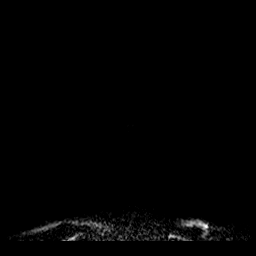
[im 13/74]
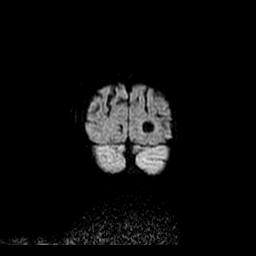
[im 25/74]
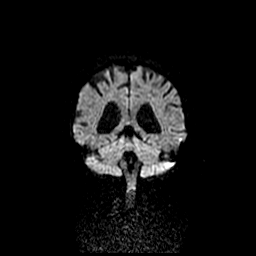
[im 37/74]
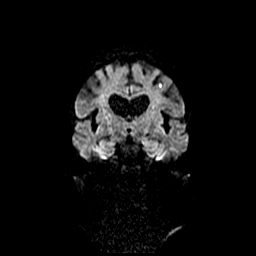
[im 49/74]
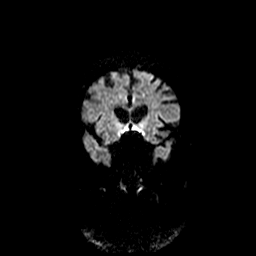
[im 61/74]
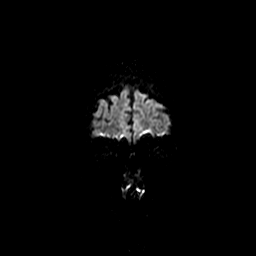
[im 74/74]
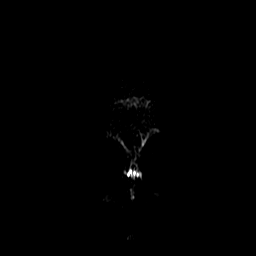

[Series 6: T1 · sagittal · 5.0mm · 0.47mm/px · 2 of 23 slices shown (1 of 2)]
[im 1/23]
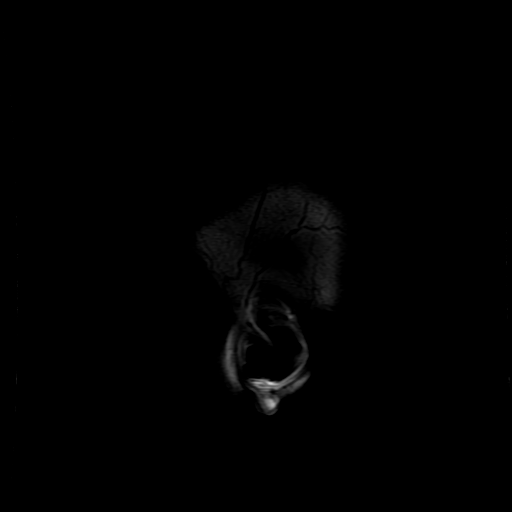
[im 23/23]
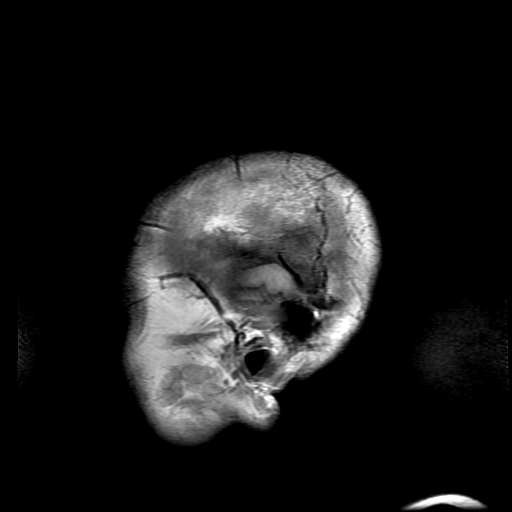

[Series 7: T2 · axial · 5.0mm · 0.43mm/px · z∈[-50,+92]mm · 2 of 25 slices shown (1 of 2)]
[im 1/25]
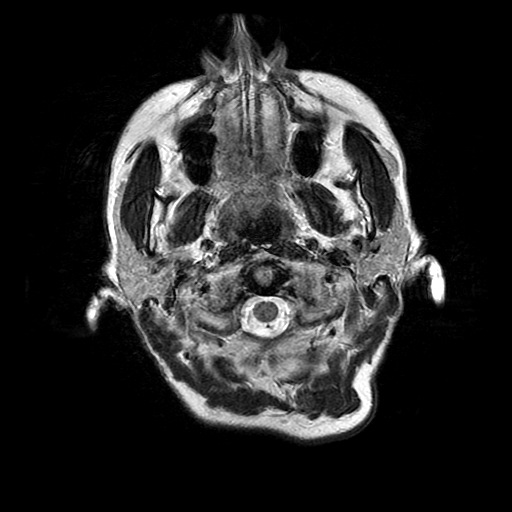
[im 25/25]
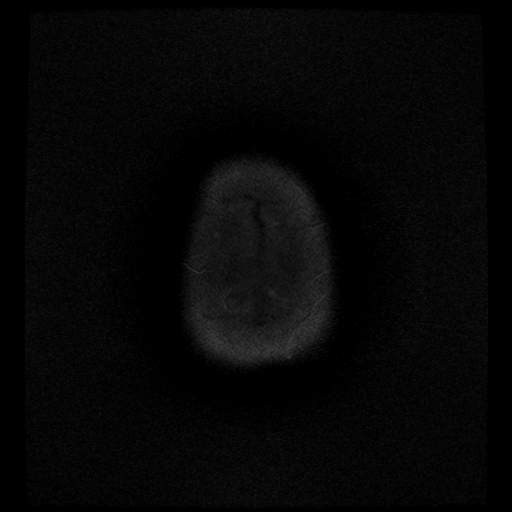

[Series 8: FLAIR · axial · 3.0mm · 0.43mm/px · z∈[-50,+92]mm · 2 of 25 slices shown]
[im 1/25]
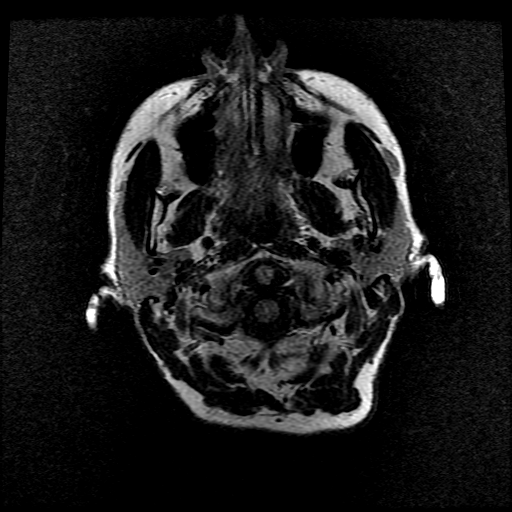
[im 25/25]
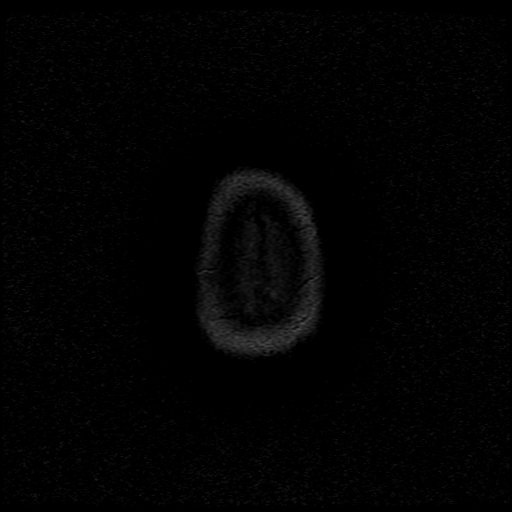

[Series 10: T1 · axial · 3.0mm · 0.47mm/px · 1 of 100 slices shown (2 of 2)]
[im 1/100]
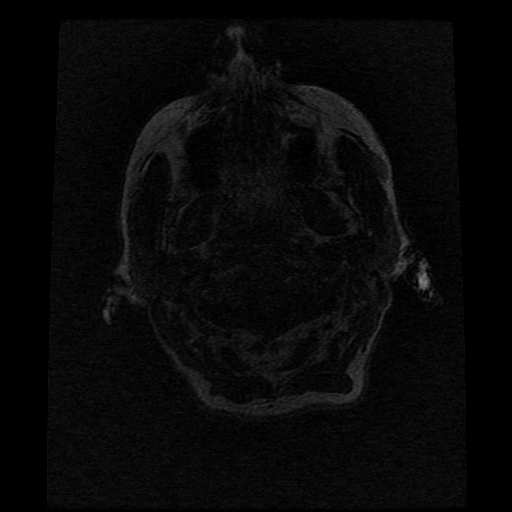

[Series 11: T2 · coronal · 5.0mm · 0.39mm/px · 3 of 28 slices shown (2 of 2)]
[im 1/28]
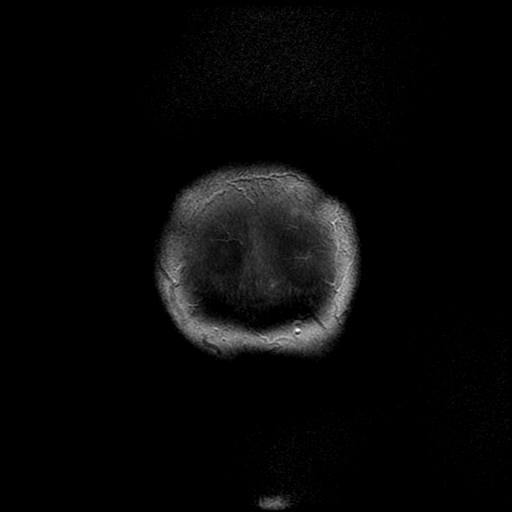
[im 14/28]
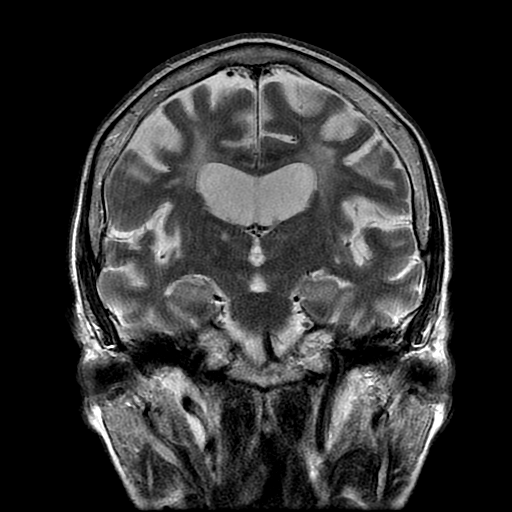
[im 28/28]
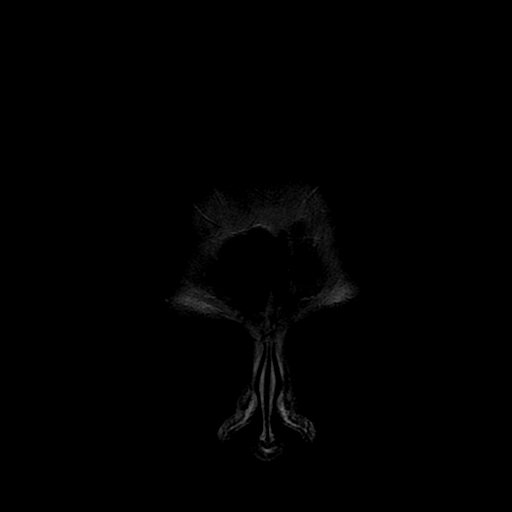

[Series 300: DWI · axial · 3.0mm · 1.09mm/px · z∈[-46,+96]mm · 5 of 49 slices shown (3 of 4)]
[im 1/49]
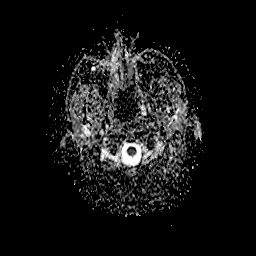
[im 13/49]
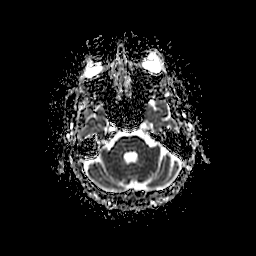
[im 25/49]
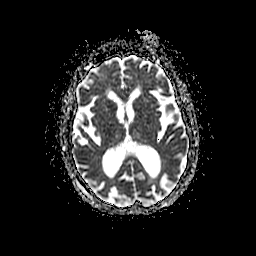
[im 37/49]
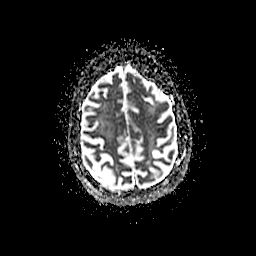
[im 49/49]
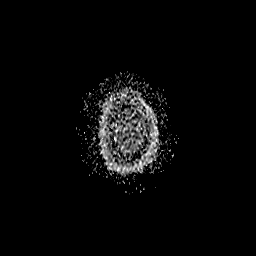

[Series 500: DWI · coronal · 5.0mm · 1.09mm/px · 4 of 37 slices shown (4 of 4)]
[im 1/37]
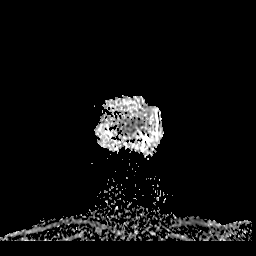
[im 13/37]
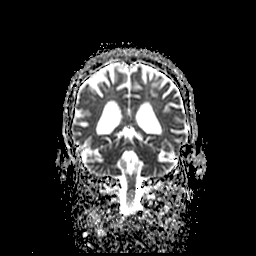
[im 25/37]
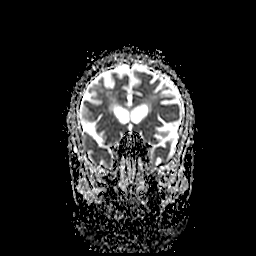
[im 37/37]
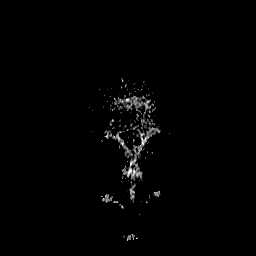

[37 of 48 positions shown; findings below may reference images not displayed]

FINDINGS: MRI HEAD FINDINGS

Brain: Punctate focus of restricted diffusion in the high left
frontal cortex (series 3, image 38). No significant edema or mass
effect. Additional punctate areas of faint DWI signal in bilateral
precentral gyri (series 3, images 37 and 39). Redemonstrated
multiple remote cortical infarcts in bilateral frontal parietal and
occipital lobes. Multiple bilateral cerebellar lacunar infarcts.
Moderate scattered patchy confluent T2/FLAIR hyperintensities within
the white matter most likely related to chronic microvascular
ischemic disease. No hydrocephalus. Age advanced moderate cerebral
atrophy with ex vacuo ventricular dilation. No acute hemorrhage. No
mass lesion or abnormal mass effect. No extra-axial fluid
collection.

Vascular: See below.

Skull and upper cervical spine: Normal marrow signal.

Sinuses/Orbits: Clear sinuses.  Unremarkable orbits.

Other: No sizable mastoid effusions.

MRA HEAD FINDINGS

Anterior circulation: Bilateral ICAs, MCAs, and ACAs are patent
without flow-limiting proximal stenosis. No aneurysm identified.

Posterior circulation: The visualized intradural vertebral arteries,
basilar artery and posterior cerebral arteries are patent without
flow limiting proximal stenosis. No aneurysm identified.
IMPRESSION: MRI:

1. Punctate acute infarct in the high left frontal cortex.
Additional faint punctate areas of DWI signal in bilateral
precentral gyri are suspicious for additional acute or subacute
infarcts, but could potentially represent artifact. Given potential
involvement of multiple vascular territories, consider a central
embolic etiology.
2. Multiple remote cortical infarcts in bilateral frontal, parietal,
and occipital lobes and multiple bilateral cerebellar lacunar
infarcts.
3. Moderate atrophy.

MRA:

No large vessel occlusion or proximal flow limiting stenosis.

## 2020-06-24 IMAGING — CT CT HEAD CODE STROKE
4 series · 16 of 47 positions shown, 18 images · non-contrast
Comparison: CT head [DATE]

CLINICAL DATA: Code stroke. Acute neuro deficit. Left-sided
weakness.

EXAM:
CT HEAD WITHOUT CONTRAST
TECHNIQUE: Contiguous axial images were obtained from the base of the skull
through the vertex without intravenous contrast.

[Series 3: head wo · axial · 0.41mm/px · z∈[-74,+46]mm · 7 of 34 slices shown, 9 images]
[im 5/34  brain]
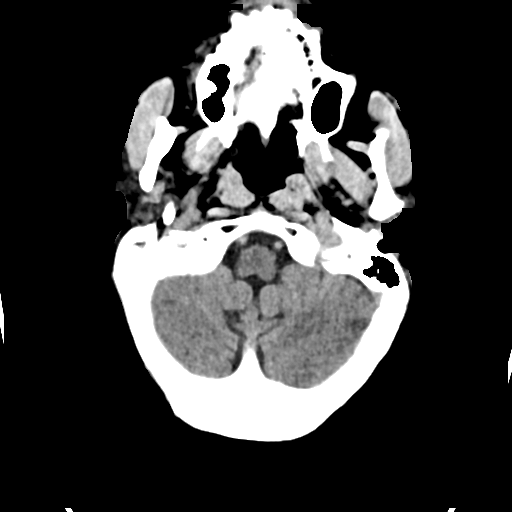
[im 5/34  bone]
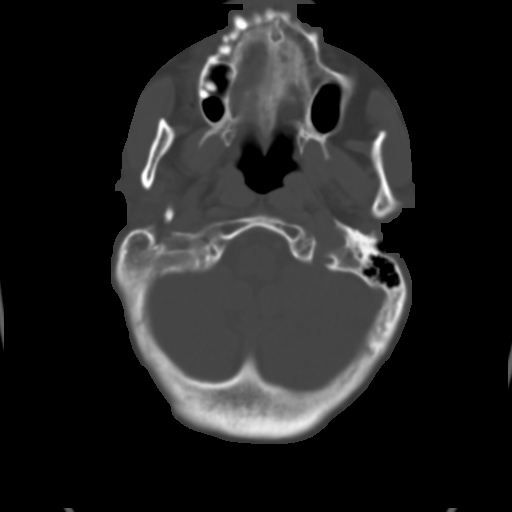
[im 9/34  brain]
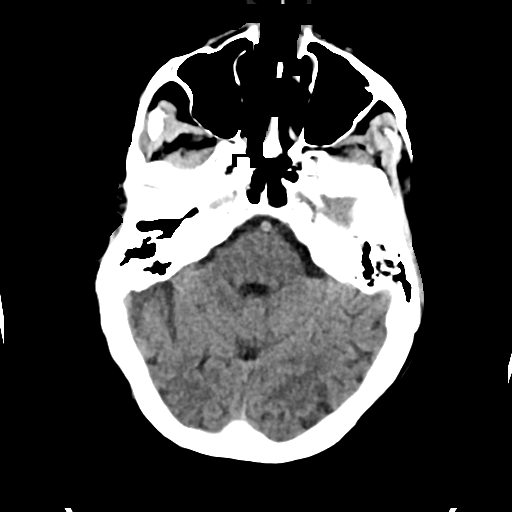
[im 13/34  brain]
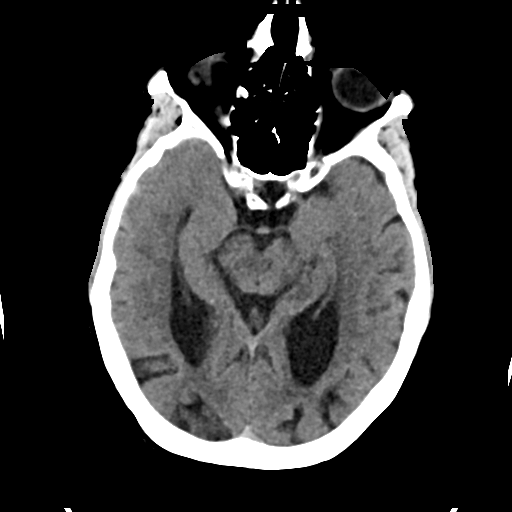
[im 17/34  brain]
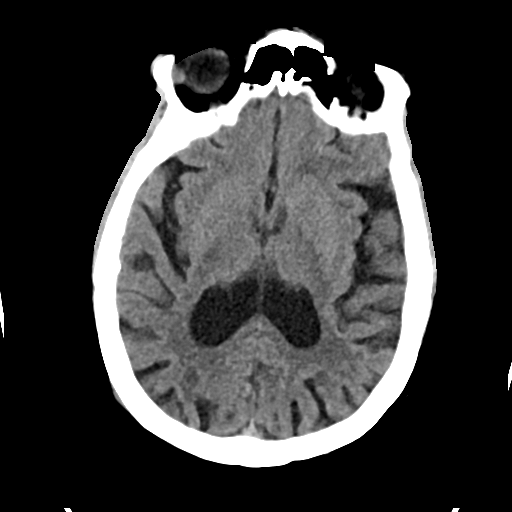
[im 21/34  brain]
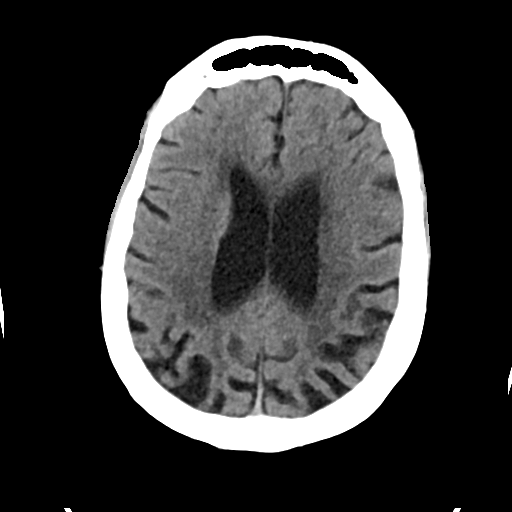
[im 21/34  bone]
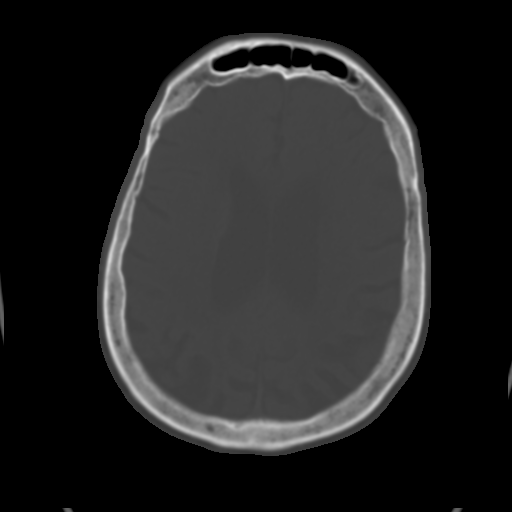
[im 25/34  brain]
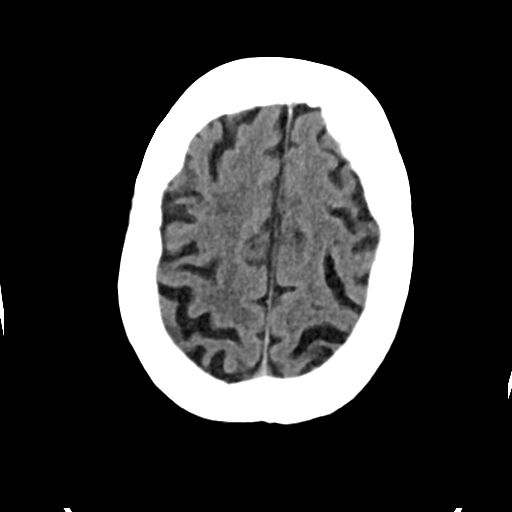
[im 29/34  brain]
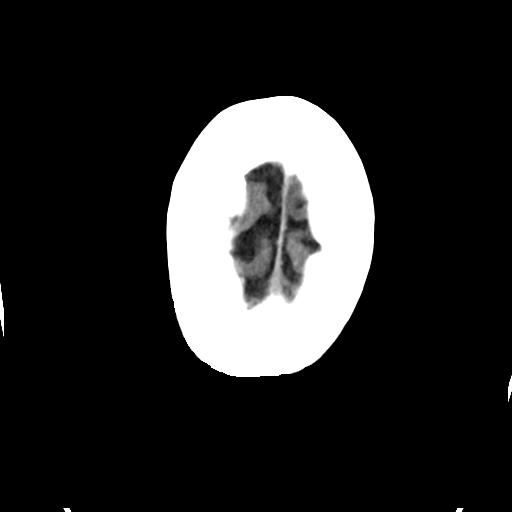

[Series 4: head bone · axial · 0.41mm/px · z∈[-78,-46]mm · 3 of 83 slices shown]
[im 9/83  bone]
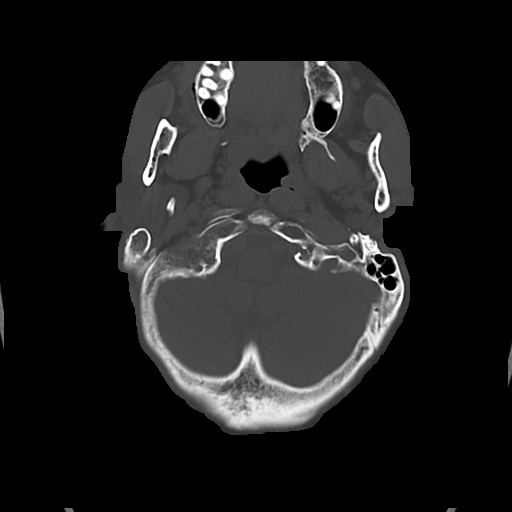
[im 17/83  bone]
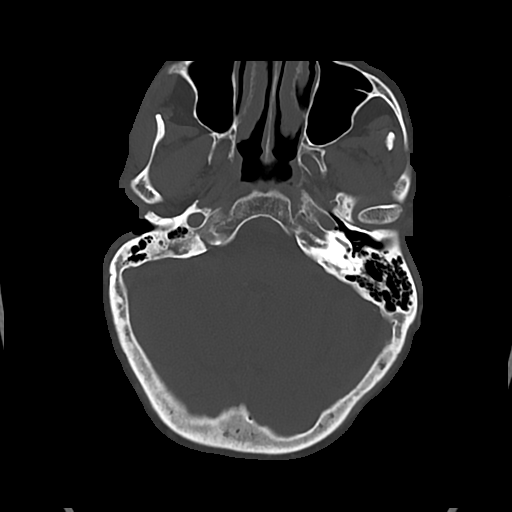
[im 25/83  bone]
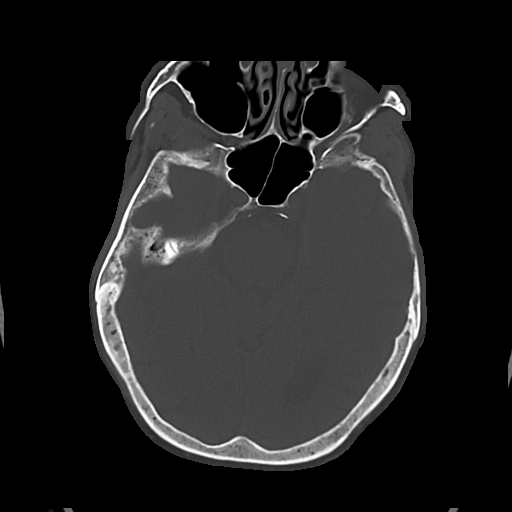

[Series 5: cor soft · coronal · 0.32mm/px · 3 of 70 slices shown]
[im 24/70  brain]
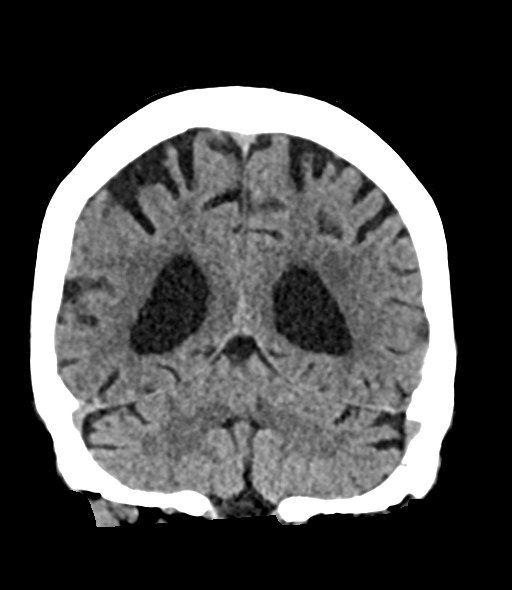
[im 31/70  brain]
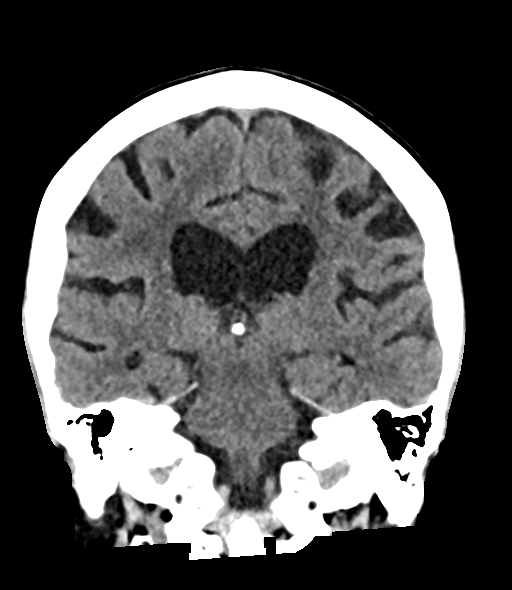
[im 39/70  brain]
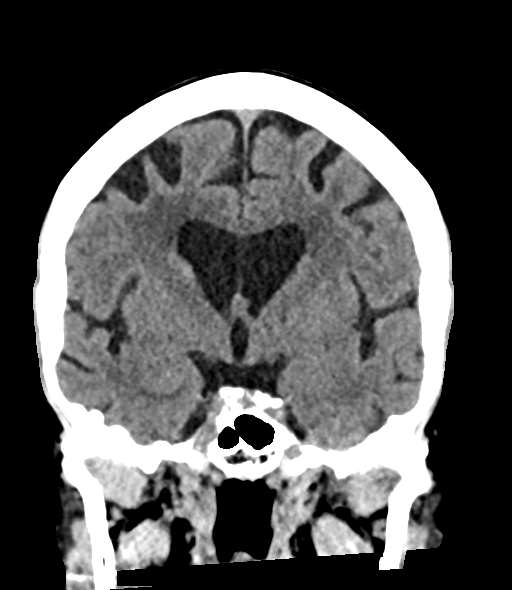

[Series 6: sag soft · sagittal · 0.37mm/px · 3 of 51 slices shown]
[im 17/51  brain]
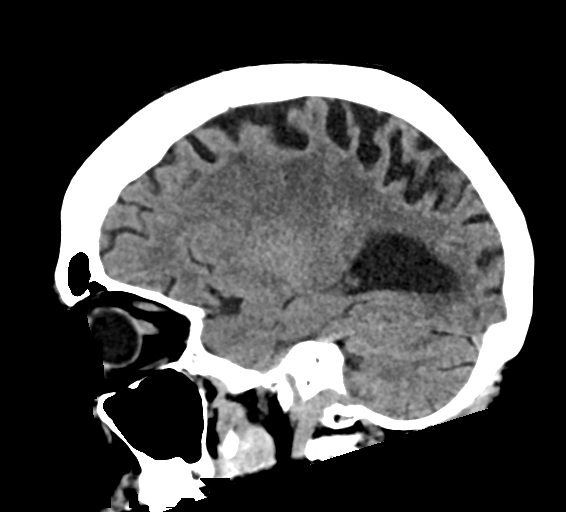
[im 26/51  brain]
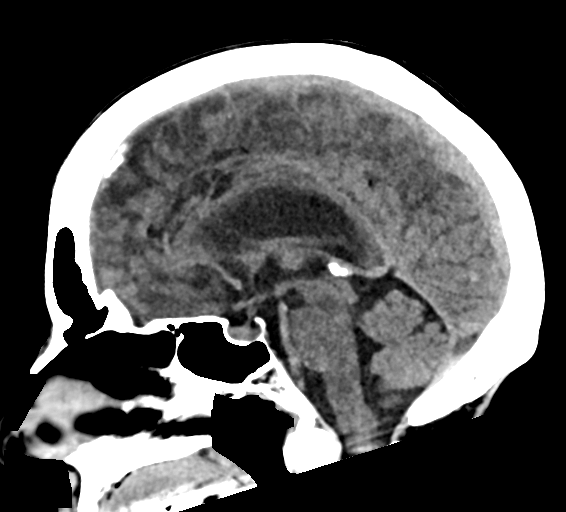
[im 34/51  brain]
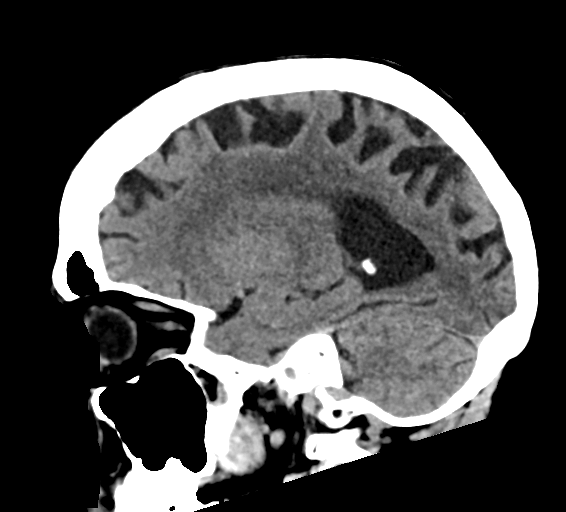

[16 of 47 positions shown; findings below may reference images not displayed]

FINDINGS: Brain: Mild atrophy. Mild white matter changes bilaterally. Small
chronic infarct right occipital lobe unchanged from prior studies.

Negative for acute infarct, hemorrhage, mass.

Vascular: Negative for hyperdense vessel

Skull: Negative

Sinuses/Orbits: Paranasal sinuses clear.  Negative orbit

Other: None

ASPECTS ([place] Stroke Program Early CT Score)

- Ganglionic level infarction (caudate, lentiform nuclei, internal
capsule, insula, M1-M3 cortex): 7

- Supraganglionic infarction (M4-M6 cortex): 3

Total score (0-10 with 10 being normal): 10
IMPRESSION: 1. No acute abnormality
2. Atrophy and chronic ischemic change.  No intracranial hemorrhage.
3. ASPECTS is 10
4. Code stroke imaging results were communicated on [DATE] at
[DATE] to provider GARGI via text page

## 2020-06-24 MED ORDER — STROKE: EARLY STAGES OF RECOVERY BOOK
Freq: Once | Status: AC
  Filled 2020-06-24: qty 1

## 2020-06-24 MED ORDER — ASPIRIN 81 MG PO CHEW
162.0000 mg | CHEWABLE_TABLET | Freq: Once | ORAL | Status: AC
  Administered 2020-06-24: 162 mg via ORAL
  Filled 2020-06-24: qty 2

## 2020-06-24 MED ORDER — ADULT MULTIVITAMIN W/MINERALS CH
1.0000 | ORAL_TABLET | Freq: Every day | ORAL | Status: DC
  Administered 2020-06-25 – 2020-06-28 (×4): 1 via ORAL
  Filled 2020-06-24 (×4): qty 1

## 2020-06-24 MED ORDER — ENOXAPARIN SODIUM 40 MG/0.4ML IJ SOSY
40.0000 mg | PREFILLED_SYRINGE | INTRAMUSCULAR | Status: DC
  Administered 2020-06-24 – 2020-06-27 (×4): 40 mg via SUBCUTANEOUS
  Filled 2020-06-24 (×4): qty 0.4

## 2020-06-24 MED ORDER — SODIUM CHLORIDE 0.9 % IV BOLUS
1000.0000 mL | Freq: Once | INTRAVENOUS | Status: AC
  Administered 2020-06-24: 1000 mL via INTRAVENOUS

## 2020-06-24 MED ORDER — SODIUM CHLORIDE 0.9 % IV SOLN: INTRAVENOUS | Status: AC

## 2020-06-24 MED ORDER — THIAMINE HCL 100 MG PO TABS
50.0000 mg | ORAL_TABLET | Freq: Every day | ORAL | Status: DC
  Administered 2020-06-25 – 2020-06-28 (×4): 50 mg via ORAL
  Filled 2020-06-24 (×4): qty 1

## 2020-06-24 MED ORDER — VITAMIN D 25 MCG (1000 UNIT) PO TABS
1000.0000 [IU] | ORAL_TABLET | Freq: Every day | ORAL | Status: DC
  Administered 2020-06-25 – 2020-06-28 (×4): 1000 [IU] via ORAL
  Filled 2020-06-24 (×4): qty 1

## 2020-06-24 MED ORDER — CLOPIDOGREL BISULFATE 300 MG PO TABS
300.0000 mg | ORAL_TABLET | Freq: Once | ORAL | Status: AC
  Administered 2020-06-24: 300 mg via ORAL
  Filled 2020-06-24: qty 1

## 2020-06-24 MED ORDER — CLOPIDOGREL BISULFATE 75 MG PO TABS
75.0000 mg | ORAL_TABLET | Freq: Every day | ORAL | Status: DC
  Administered 2020-06-25 – 2020-06-28 (×4): 75 mg via ORAL
  Filled 2020-06-24 (×4): qty 1

## 2020-06-24 MED ORDER — SENNOSIDES-DOCUSATE SODIUM 8.6-50 MG PO TABS: 1.0000 | ORAL_TABLET | Freq: Every evening | ORAL | Status: DC | PRN

## 2020-06-24 MED ORDER — ACETAMINOPHEN 650 MG RE SUPP: 650.0000 mg | RECTAL | Status: DC | PRN

## 2020-06-24 MED ORDER — ACETAMINOPHEN 325 MG PO TABS: 650.0000 mg | ORAL_TABLET | ORAL | Status: DC | PRN

## 2020-06-24 MED ORDER — LOSARTAN POTASSIUM 25 MG PO TABS
25.0000 mg | ORAL_TABLET | Freq: Every day | ORAL | Status: DC
  Administered 2020-06-25 – 2020-06-27 (×3): 25 mg via ORAL
  Filled 2020-06-24 (×3): qty 1

## 2020-06-24 MED ORDER — ACETAMINOPHEN 160 MG/5ML PO SOLN: 650.0000 mg | ORAL | Status: DC | PRN

## 2020-06-24 MED ORDER — ASCORBIC ACID 500 MG PO TABS
250.0000 mg | ORAL_TABLET | Freq: Every day | ORAL | Status: DC
  Administered 2020-06-24 – 2020-06-28 (×5): 250 mg via ORAL
  Filled 2020-06-24 (×5): qty 1

## 2020-06-24 MED ORDER — SODIUM CHLORIDE 0.9% FLUSH
3.0000 mL | Freq: Once | INTRAVENOUS | Status: AC
  Administered 2020-06-24: 3 mL via INTRAVENOUS

## 2020-06-24 MED ORDER — ASPIRIN 81 MG PO CHEW
81.0000 mg | CHEWABLE_TABLET | Freq: Every day | ORAL | Status: DC
  Administered 2020-06-25 – 2020-06-28 (×4): 81 mg via ORAL
  Filled 2020-06-24 (×4): qty 1

## 2020-06-24 NOTE — Consult Note (Addendum)
NEUROLOGY CONSULTATION NOTE   Date of service: June 24, 2020 Patient Name: Jacqueline Bonilla MRN:  [id number] DOB:  Jan 13, 1955 Reason for consult: "Stroke code" Requesting Provider: Luna Fuse, MD _ _ _   _ __   _ __ _ _  __ __   _ __   __ _  History of Present Illness  Jacqueline Bonilla is a 65 y.o. female with PMH significant for Htn, Osteoporosis, thrombocytopenia, microhematuria who ws on the treadmill today at  the Gym, fell and had sudden onset left sided weakness and mild Left facial droop. Symptoms started abruptly at 1000.  She was brought in b EMS who noted waxing and waning left sided weakness. On initial eval, could not lift her left leg up, had LUE drift.  STAT CTH was obtained and was negative for stroke. Symptoms improved after the CT scan with a left lower ext drift and mild LUE ataxia.  mRS: 0 tPA: offered but patient did not think that her deficit was disabling and declined tPA as she is risk averse. Does not want tPA even if her symptoms are to worsen. Thrombectomy: Not offered, low suspicion for LVO given only motor deficit. NIHSS components Score: Comment  1a Level of Conscious 0[x]  1[]  2[]  3[]      1b LOC Questions 0[x]  1[]  2[]       1c LOC Commands 0[x]  1[]  2[]       2 Best Gaze 0[x]  1[]  2[]       3 Visual 0[x]  1[]  2[]  3[]      4 Facial Palsy 0[]  1[x]  2[]  3[]      5a Motor Arm - left 0[x]  1[]  2[]  3[]  4[]  UN[]    5b Motor Arm - Right 0[x]  1[]  2[]  3[]  4[]  UN[]    6a Motor Leg - Left 0[]  1[x]  2[]  3[]  4[]  UN[]    6b Motor Leg - Right 0[x]  1[]  2[]  3[]  4[]  UN[]    7 Limb Ataxia 0[]  1[x]  2[]  3[]  UN[]     8 Sensory 0[x]  1[]  2[]  UN[]      9 Best Language 0[x]  1[]  2[]  3[]      10 Dysarthria 0[x]  1[]  2[]  UN[]      11 Extinct. and Inattention 0[x]  1[]  2[]       TOTAL: 3     ROS   Constitutional Denies weight loss, fever and chills.   HEENT Denies changes in vision and hearing.   Respiratory Denies SOB and cough.   CV Denies palpitations and CP   GI Denies abdominal pain,  nausea, vomiting and diarrhea.   GU Denies dysuria and urinary frequency.   MSK Denies myalgia and joint pain.   Skin Denies rash and pruritus.   Neurological Denies headache and syncope.   Psychiatric Denies recent changes in mood. Denies anxiety and depression   Past History   Past Medical History:  Diagnosis Date   Anemia    Anxiety    Depression    Dysmenorrhea    Endometriosis    Fibroid    Hypertension    Microhematuria    negative workup   Osteoporosis    Tachycardia    Thrombocytopenia [facility])    Past Surgical History:  Procedure Laterality Date   BREAST BIOPSY     CESAREAN SECTION     hysteroscopic resection     implantable loop recorder implant  10/21/2019   Medtronic Reveal Linq model LNQ 22 ([place] [id number]) implantable loop recorder   Family History  Problem Relation Age of Onset   Cancer Father  non hodgkin lymphoma & skin   Heart attack Maternal Grandfather    Dementia Mother    Polymyositis Sister    Social History   Socioeconomic History   Marital status: Married    Spouse name: Not on file   Number of children: 1   Years of education: Not on file   Highest education level: Not on file  Occupational History   Occupation: accounting  Tobacco Use   Smoking status: Never   Smokeless tobacco: Never  Vaping Use   Vaping Use: Never used  Substance and Sexual Activity   Alcohol use: Never   Drug use: Never   Sexual activity: Not Currently    Partners: Male    Comment: husband vasectomy  Other Topics Concern   Not on file  Social History Narrative   Lives with husband   Grandchildren - 1   Works - Investment banker, 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%   Gun in home - yes - secured      Right handed   Caffeine: maybe tea every now and then   Social Determinants of Radio broadcast assistant Strain: Not on file  Food Insecurity: Not on file  Transportation Needs: Not on file  Physical Activity: Not on file  Stress: Not on file  Social Connections:  Not on file   Allergies  Allergen Reactions   Avelox [Moxifloxacin]    Ciprofloxacin Hives   Floraquin [Iodoquinol]     No cipro or others   Iohexol      Code: HIVES, Desc: HIVES S/P IVP MANY YRS AGO...NO PROBLEM W/O PREP TODAY/A.CALHOUN, Onset Date: 94174081    Levaquin [Levofloxacin]    Quinolones    Shellfish Allergy Itching    Itching under the chin. Described by patient but not seen by husband.    Medications  (Not in a hospital admission)    Vitals   Vitals:   06/24/20 1100  Weight: 52.4 kg     Body mass index is 19.83 kg/m.  Physical Exam   General: Laying comfortably in bed; in no acute distress.  HENT: Normal oropharynx and mucosa. Normal external appearance of ears and nose.  Neck: Supple, no pain or tenderness  CV: No JVD. No peripheral edema.  Pulmonary: Symmetric Chest rise. Normal respiratory effort.  Abdomen: Soft to touch, non-tender.  Ext: No cyanosis, edema, or deformity  Skin: No rash. Normal palpation of skin.   Musculoskeletal: Normal digits and nails by inspection. No clubbing.  Neurologic Examination  Mental status/Cognition: Alert, oriented to self, place, month and year, good attention. Speech/language: Fluent, comprehension intact, object naming intact, repetition intact. Cranial nerves:   CN II Pupils equal and reactive to light, no VF deficits   CN III,IV,VI EOM intact, no gaze preference or deviation, no nystagmus   CN V normal sensation in V1, V2, and V3 segments bilaterally   CN VII Mild flattening of the left nasolabial fold.   CN VIII normal hearing to speech   CN IX & X normal palatal elevation, no uvular deviation   CN XI 5/5 head turn and 5/5 shoulder shrug bilaterally   CN XII midline tongue protrusion   Motor:  Muscle bulk: poor, tone normal, pronator drift yes LUE drift. Mvmt Root Nerve  Muscle Right Left Comments  SA C5/6 Ax Deltoid     EF C5/6 Mc Biceps     EE C6/7/8 Rad Triceps     WF C6/7 Med FCR  WE C7/8 PIN  ECU     F Ab C8/T1 U ADM/FDI     HF L1/2/3 Fem Illopsoas     KE L2/3/4 Fem Quad     DF L4/5 D Peron Tib Ant     PF S1/2 Tibial Grc/Sol      Reflexes:  Right Left Comments  Pectoralis      Biceps (C5/6)     Brachioradialis (C5/6)      Triceps (C6/7)      Patellar (L3/4)      Achilles (S1)      Hoffman      Plantar     Jaw jerk    Sensation:  Light touch    Pin prick    Temperature    Vibration   Proprioception    Coordination/Complex Motor:  - Finger to Nose with ataxia in LUE - Heel to shin unable to do. - Rapid alternating movement are slowed in LUE - Gait: Deferred.  Labs   CBC:  Recent Labs  Lab 06/24/20 1100 06/24/20 1106  WBC 6.3  --   NEUTROABS 4.5  --   HGB 13.1 14.3  HCT 40.4 42.0  MCV 92.2  --   PLT 214  --     Basic Metabolic Panel:  Lab Results  Component Value Date   NA 139 06/24/2020   K 4.1 06/24/2020   CO2 24 07/20/2019   GLUCOSE 95 06/24/2020   BUN 19 06/24/2020   CREATININE 1.10 (H) 06/24/2020   CALCIUM 9.4 07/20/2019   GFRNONAA 68 07/20/2019   GFRAA 78 07/20/2019   Lipid Panel:  Lab Results  Component Value Date   LDLCALC 93 06/11/2019   HgbA1c:  Lab Results  Component Value Date   HGBA1C 5.0 07/20/2019   Urine Drug Screen: No results found for: LABOPIA, COCAINSCRNUR, LABBENZ, AMPHETMU, THCU, LABBARB  Alcohol Level No results found for: [place]  CT Head without contrast(personally reviewed): CTH was negative for a large hypodensity concerning for a large territory infarct or hyperdensity concerning for an ICH  MRI Brain: Pending  Impression   JALEIAH ASAY is a 65 y.o. female with PMH significant for HTN, Osteoporosis, thrombocytopenia, microhematuria who ws on the treadmill today, fell and had sudden onset left sided weakness and mild Left facial droop. Symptoms started abruptly at 1000. Her neurologic examination is notable for mild left sided weakness with ataxia. Decline tPA. Not particularly concerning for an LVO  given no sensory deficit, no cortical signs.  Primary Diagnosis:  Other cerebral infarction due to occlusion of stenosis of small artery.  Secondary Diagnosis: Essential (primary) hypertension  Recommendations  Plan:  - Frequent Neuro checks per stroke unit protocol - Recommend brain imaging with MRI Brain without contrast - Recommend Vascular imaging with MRA Angio Head without contrast and US Carotid doppler - Recommend obtaining TTE - Recommend obtaining Lipid panel with LDL - Please start statin if LDL > 70 - Recommend HbA1c - Antithrombotic - Aspirin 325mg  and plavix 300mg  once, followed by Asprin 81mg  daily and plavix 75mg  daily for 3 weeks, then aspirin 81mg  daily alone. - Recommend DVT ppx - SBP goal - permissive hypertension first 24 h < 220/110. Held home meds.  - Recommend Telemetry monitoring for arrythmia - Recommend bedside swallow screen prior to PO intake. - Stroke education booklet - Recommend PT/OT/SLP consult  ______________________________________________________________________  Plan discussed with Dr. Almyra Free  Thank you for the opportunity to take part in the care of this patient. If you have  any further questions, please contact the neurology consultation attending.  Signed,  [place] Pager Number [phone] _ _ _   _ __   _ __ _ _  __ __   _ __   __ _

## 2020-06-24 NOTE — Plan of Care (Signed)
POC D/W pt, pt verbalized understanding.

## 2020-06-24 NOTE — Code Documentation (Signed)
Stroke Response Nurse Documentation Code Documentation  Jacqueline Bonilla is a 65 y.o. female arriving to [place] ED via [facility] on 06/24/2020 with past medical hx of CVA, thrombocytopenia, HTN, and Osteporosis. Code stroke was activated by EMS. Patient from gym where she was working out. She was on the lelipitical with no issue. At 1000, she stepped off the ellipitical and noted that her left leg was weak. She fell to the ground, but did not hit her head. Staff called EMS and due to left arm weakness, mild facial droop, and left leg weakness they activated a Code Stroke. EMS reports that patient's symptoms fluctuated throughout their evaluation getting better and then worse.   Pt took 162 of ASA this morning. Stroke team at the bedside on patient arrival. Labs drawn and patient cleared for CT by Dr. Almyra Free. Patient to CT with team. NIHSS 3, see documentation for details and code stroke times. Patient with left facial droop, left arm weakness, and left leg weakness on exam. At baseline, pt reports mild right sided hand weakness and numbness with no drift noted. The following imaging was completed:  CT. Patient is not a candidate for tPA due to patient refusing. Pt questioned if her symptoms were to get worse if she would accept tPA and she reported no.  Care/Plan: q 2 mNIHSS/VS, MRI, and Stroke Work-up. Bedside handoff with ED RN Magda Paganini.    Kathrin Greathouse  Stroke Response RN

## 2020-06-24 NOTE — H&P (Signed)
History and Physical    Jacqueline Bonilla DGU:[id number] DOB: 1955-02-28 DOA: 06/24/2020  PCP: Horald Pollen, MD Consultants:  neurology: [name], cardiology: Dr. Rayann Heman Patient coming from:  Home - lives with husband    Chief Complaint: sudden left sided weakness and left facial droop   HPI: Jacqueline Bonilla is a 65 y.o. female with medical history significant of past CVA, vascular dementia, HTN, thrombocytopenia and ILR who presented for left sided weakness and facial droop.  She was at gym exercising on the elliptical and when she went to get off she fell to the floor.  She had sudden left sided weakness and left facial droop. She believes this happened around 10AM. EMS was called. She denies any slurred speech or word finding difficulties and states she does not recall any left arm weakness, but this was reported by EMS.  She can not tell me if her symptoms have improved. She states she tried to stand up and go to the bathroom and was having to lean very heavily on aide so they put her back in bed. She denies any headaches, vision changes, chest pain, palpitations, fever, abdominal pain, N/V/D. She denies any loss of sensation. She is right handed.    ED Course: code stroke initiated. Neuro team consulted. Lab work with no pertinent findings. Vitals stable with bp: 142/64, HR: 77, RR: 13, oxygen 100% on RA and afebrile.  CTH with no acute infarct/hemorrhage. Offered tpa and she declined. Asked to admit for medical management and stroke work up.   Review of Systems: As per HPI; otherwise review of systems reviewed and negative.   Ambulatory Status:  Ambulates without assistance    Past Medical History:  Diagnosis Date   Anemia    Anxiety    Depression    Dysmenorrhea    Endometriosis    Fibroid    Hypertension    Microhematuria    negative workup   Osteoporosis    Tachycardia    Thrombocytopenia (HCC)     Past Surgical History:  Procedure Laterality Date    BREAST BIOPSY     CESAREAN SECTION     hysteroscopic resection     implantable loop recorder implant  10/21/2019   Medtronic Reveal Linq model LNQ 22 ([place] [id number]) implantable loop recorder    Social History   Socioeconomic History   Marital status: Married    Spouse name: Not on file   Number of children: 1   Years of education: Not on file   Highest education level: Not on file  Occupational History   Occupation: accounting  Tobacco Use   Smoking status: Never   Smokeless tobacco: Never  Vaping Use   Vaping Use: Never used  Substance and Sexual Activity   Alcohol use: Never   Drug use: Never   Sexual activity: Not Currently    Partners: Male    Comment: husband vasectomy  Other Topics Concern   Not on file  Social History Narrative   Lives with husband   Grandchildren - 1   Works - Investment banker, 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%   Gun in home - yes - secured      Right handed   Caffeine: maybe tea every now and then   Social Determinants of Radio broadcast assistant Strain: Not on file  Food Insecurity: Not on file  Transportation Needs: Not on file  Physical Activity: Not on file  Stress: Not on file  Social Connections: Not on file  Intimate Partner Violence: Not on file    Allergies  Allergen Reactions   Avelox [Moxifloxacin]    Ciprofloxacin Hives   Floraquin [Iodoquinol]     No cipro or others   Iohexol      Code: HIVES, Desc: HIVES S/P IVP MANY YRS AGO...NO PROBLEM W/O PREP TODAY/A.CALHOUN, Onset Date: 02409735    Levaquin [Levofloxacin]    Quinolones    Shellfish Allergy Itching    Itching under the chin. Described by patient but not seen by husband.    Family History  Problem Relation Age of Onset   Cancer Father        non hodgkin lymphoma & skin   Heart attack Maternal Grandfather    Dementia Mother    Polymyositis Sister     Prior to Admission medications   Medication Sig Start Date End Date Taking? Authorizing Provider  Ascorbic Acid  (VITAMIN C PO) Take 1 tablet by mouth daily.   Yes [provider]  aspirin EC 81 MG tablet Take 162 mg by mouth 2 (two) times daily. Swallow whole.   Yes [provider]  Cholecalciferol (VITAMIN D3 GUMMIES PO) Take 1,000 mcg by mouth daily.   Yes [provider]  losartan (COZAAR) 25 MG tablet Take 1 tablet (25 mg total) by mouth daily. Patient taking differently: Take 25 mg by mouth daily in the afternoon. 07/20/19 06/24/20 Yes Sagardia, Ines Bloomer, MD  Multiple Vitamins-Minerals (ZINC PO) Take 1 tablet by mouth daily.   Yes [provider]  Thiamine HCl (VITAMIN B-1 PO) Take 1 tablet by mouth daily.   Yes [provider]    Physical Exam: Vitals:   06/24/20 1126 06/24/20 1127 06/24/20 1128 06/24/20 1213  BP:  (!) 142/64  (!) 165/94  Pulse: 79 79  74  Resp: 13 16  17   Temp: 98.4 F (36.9 C) 98.4 F (36.9 C)    TempSrc: Temporal Temporal    SpO2: 100% 100%  100%  Weight:      Height:   5\' 4"  (1.626 m)      General:  Appears calm and comfortable and is in NAD Eyes:  PERRL, EOMI, normal lids, iris ENT:  grossly normal hearing, lips & tongue, mmm; appropriate dentition Neck:  no LAD, masses or thyromegaly; no carotid bruits Cardiovascular:  RRR, no m/r/g. No LE edema.  Respiratory:   CTA bilaterally with no wheezes/rales/rhonchi.  Normal respiratory effort. Abdomen:  soft, NT, ND, NABS Back:   normal alignment, no CVAT Skin:  no rash or induration seen on limited exam Musculoskeletal:  grossly normal tone BUE/BLE, both right and left upper and lower extremities strength intact 5/5 and equal. good ROM, no bony abnormality. Lower extremity:  No LE edema.  Limited foot exam with no ulcerations.  2+ distal pulses. Psychiatric:  grossly normal mood and affect, speech fluent and appropriate, AOx3 Neurologic:  CN 2-12 grossly intact, moves all extremities in coordinated fashion, sensation intact. Diadochokinesis intact, but slow. Proprioception  intact. DTR 2+    Radiological Exams on Admission: Independently reviewed - see discussion in A/P where applicable  CT HEAD CODE STROKE WO CONTRAST  Result Date: 06/24/2020 CLINICAL DATA:  Code stroke. Acute neuro deficit. Left-sided weakness. EXAM: CT HEAD WITHOUT CONTRAST TECHNIQUE: Contiguous axial images were obtained from the base of the skull through the vertex without intravenous contrast. COMPARISON:  CT head 06/30/2019 FINDINGS: Brain: Mild atrophy. Mild white matter changes bilaterally. Small chronic infarct right  occipital lobe unchanged from prior studies. Negative for acute infarct, hemorrhage, mass. Vascular: Negative for hyperdense vessel Skull: Negative Sinuses/Orbits: Paranasal sinuses clear.  Negative orbit Other: None ASPECTS ([place] Stroke Program Early CT Score) - Ganglionic level infarction (caudate, lentiform nuclei, internal capsule, insula, M1-M3 cortex): 7 - Supraganglionic infarction (M4-M6 cortex): 3 Total score (0-10 with 10 being normal): 10 IMPRESSION: 1. No acute abnormality 2. Atrophy and chronic ischemic change.  No intracranial hemorrhage. 3. ASPECTS is 10 4. Code stroke imaging results were communicated on 06/24/2020 at 11:17 am to provider Lorrin Goodell via text page Electronically Signed   By: Franchot Gallo M.D.   On: 06/24/2020 11:18    EKG: Independently reviewed.  NSR with rate 77; nonspecific ST changes with no evidence of acute ischemia   Labs on Admission: I have personally reviewed the available labs and imaging studies at the time of the admission.  Pertinent labs:  Creatinine: 1.10   Assessment/Plan Principal Problem:   Acute left-sided weakness Stroke work up initiated and neurology on board. She declined tpa.  -MRI/MRA pending if she can do with ILR -echo and carotid dopplers pending -a1c/lipid panel pending. Treat with statin if LDL >70 -PT/OT/ST to eval -ASA 325mg  and plavix 300mg  given x 1 today. Will continue ASA 81mg  daily and plavix  75mg  daily x 3 weeks then asa 81 mg alone.  -telemetry   Active Problems:   Essential hypertension -allow permissive HTN x 24 hours. Systolic <754. Will start home medication of losartan tomorrow afternoon.     Cerebrovascular accident [facility]) Past hx of CVA. Per neurology recommendation continue ASA and plavix.  Already has neurologist outpatient.     Vascular dementia without behavioral disturbance (HCC) -appears to be at baseline. Continue f/u with outpatient neurologist.    Thrombocytopenia -wnl today, monitor. F/u outpatient as needed.   Elevated creatinine Light IVF x 24 hours or less if she passes swallow screen. Repeat labs in AM.   Body mass index is 19.83 kg/m.    Level of care: Telemetry Medical DVT prophylaxis:  Lovenox  Code Status:  Full - confirmed with patient Family Communication: Her husband was here previously and she states he will come back. I do not need to call him at this time.  Her son walked in at the end and discussed POC with him.  Disposition Plan:  The patient is from: home  Anticipated d/c is to: home after stroke work up has been completed and she has been evaluated by therapy with recommendations.   Consults called: neurology stroke team in ER  Admission status: observation    Orma Flaming MD [facility]   How to contact the [facility] Attending or Consulting provider [place] or covering provider during after hours [place], for this patient?  Check the care team in [facility] and look for a) attending/consulting [facility] provider listed and b) the [facility] team listed Log into [email or web address] and use [place]'s universal password to access. If you do not have the password, please contact the hospital operator. Locate the [facility] provider you are looking for under [facility] and page to a number that you can be directly reached. If you still have difficulty reaching the provider, please page the [facility] (Director on Call) for the Hospitalists listed on amion  for assistance.   06/24/2020, 1:38 PM

## 2020-06-24 NOTE — Progress Notes (Signed)
  Echocardiogram 2D Echocardiogram has been performed.  Jacqueline Bonilla 06/24/2020, 2:18 PM

## 2020-06-24 NOTE — Plan of Care (Signed)
  Problem: Education: Goal: Knowledge of disease or condition will improve Outcome: Progressing Goal: Knowledge of secondary prevention will improve Outcome: Progressing Goal: Knowledge of patient specific risk factors addressed and post discharge goals established will improve Outcome: Progressing   Problem: Ischemic Stroke/TIA Tissue Perfusion: Goal: Complications of ischemic stroke/TIA will be minimized Outcome: Progressing

## 2020-06-24 NOTE — Progress Notes (Signed)
Carotid duplex has been completed.  Results can be found under chart review under CV PROC. 06/24/2020 6:32 PM Rozelia Catapano RVT, RDMS

## 2020-06-24 NOTE — ED Notes (Signed)
Call to 3W 

## 2020-06-24 NOTE — ED Triage Notes (Signed)
Pt in [facility] from gym, pt stepped off the elliptical when she noticed her Left leg was weak. Pt went to the ground, did not hit her head, no LOC. 911 called, symptoms fluctuated but never resolved. Pt reports hx of Right side weakness from previous stroke  18G RAC

## 2020-06-24 NOTE — ED Provider Notes (Signed)
[facility] EMERGENCY DEPARTMENT Provider Note   CSN: [id number] Arrival date & time: 06/24/20  1055  An emergency department physician performed an initial assessment on this suspected stroke patient at 1055.  History Chief Complaint  Patient presents with   Code Stroke    Jacqueline Bonilla is a 65 y.o. female.  Patient was a code stroke activation.  She has a history of stroke affecting the right upper and lower extremity several years ago.  Today she was feeling fine and went to the gym working out on a treadmill type machine.  She decided take a break and stop and felt fatigued.  Suddenly she felt her left side leg giveaway.  She continued to feel weak in her left leg and left arm and called 911 concerned that she may have had another stroke.  Otherwise denies any headache or chest pain no fever no cough no vomiting no diarrhea.      Past Medical History:  Diagnosis Date   Anemia    Anxiety    Depression    Dysmenorrhea    Endometriosis    Fibroid    Hypertension    Microhematuria    negative workup   Osteoporosis    Tachycardia    Thrombocytopenia ([place])     Patient Active Problem List   Diagnosis Date Noted   Cerebral infarction ([place]) 06/24/2020   Vascular dementia with behavior disturbance ([place]) 10/13/2019   Vascular dementia without behavioral disturbance ([place]) 07/15/2019   Cerebrovascular accident ([place]) 06/25/2019   Brain fog 04/22/2019   History of COVID-19 04/22/2019   Essential hypertension 03/03/2019   Hypertensive urgency 03/03/2019    Past Surgical History:  Procedure Laterality Date   BREAST BIOPSY     CESAREAN SECTION     hysteroscopic resection     implantable loop recorder implant  10/21/2019   [employer]tr[employer] Reveal Linq model LNQ 22 (SN [id number]) implantable loop recorder     OB History     Gravida  1   Para  1   Term  1   Preterm      AB      Living  1      SAB      IAB      Ectopic      Multiple       Live Births  1           Family History  Problem Relation Age of Onset   Cancer Father        non hodgkin lymphoma & skin   Heart attack Maternal Grandfather    Dementia Mother    Polymyositis Sister     Social History   Tobacco Use   Smoking status: Never   Smokeless tobacco: Never  Vaping Use   Vaping Use: Never used  Substance Use Topics   Alcohol use: Never   Drug use: Never    Home Medications Prior to Admission medications   Medication Sig Start Date End Date Taking? Authorizing Provider  Ascorbic Acid (VITAMIN C PO) Take 1 tablet by mouth daily.   Yes [provider]  aspirin EC 81 MG tablet Take 162 mg by mouth 2 (two) times daily. Swallow whole.   Yes [provider]  Cholecalciferol (VITAMIN D3 GUMMIES PO) Take 1,000 mcg by mouth daily.   Yes [provider]  losartan (COZAAR) 25 MG tablet Take 1 tablet (25 mg total) by mouth daily. Patient taking differently: Take 25 mg by  mouth daily in the afternoon. 07/20/19 06/24/20 Yes Sagardia, Ines Bloomer, MD  Multiple Vitamins-Minerals (ZINC PO) Take 1 tablet by mouth daily.   Yes [provider]  Thiamine HCl (VITAMIN B-1 PO) Take 1 tablet by mouth daily.   Yes [provider]    Allergies    Avelox [moxifloxacin], Ciprofloxacin, Floraquin [iodoquinol], Iohexol, Levaquin [levofloxacin], Quinolones, and Shellfish allergy  Review of Systems   Review of Systems  Constitutional:  Negative for fever.  HENT:  Negative for ear pain.   Eyes:  Negative for pain.  Respiratory:  Negative for cough.   Cardiovascular:  Negative for chest pain.  Gastrointestinal:  Negative for abdominal pain.  Genitourinary:  Negative for flank pain.  Musculoskeletal:  Negative for back pain.  Skin:  Negative for rash.  Neurological:  Negative for headaches.   Physical Exam Updated Vital Signs BP (!) 165/94   Pulse 74   Temp 98.4 F (36.9 C) (Temporal)   Resp 17   Ht 5\' 4"  (1.626 m)    Wt 52.4 kg   LMP 01/02/2003   SpO2 100%   BMI 19.83 kg/m   Physical Exam Constitutional:      General: She is not in acute distress.    Appearance: Normal appearance.  HENT:     Head: Normocephalic.     Nose: Nose normal.  Eyes:     Extraocular Movements: Extraocular movements intact.  Cardiovascular:     Rate and Rhythm: Normal rate.  Pulmonary:     Effort: Pulmonary effort is normal.  Musculoskeletal:        General: Normal range of motion.     Cervical back: Normal range of motion.  Neurological:     Mental Status: She is alert.     Comments: Patient able to lift all extremities up above gravity.  However left upper extremity and left lower extremity appears to have moderate to mild drift.    ED Results / Procedures / Treatments   Labs (all labs ordered are listed, but only abnormal results are displayed) Labs Reviewed  COMPREHENSIVE METABOLIC PANEL - Abnormal; Notable for the following components:      Result Value   Creatinine, Ser 1.05 (*)    GFR, Estimated 58 (*)    All other components within normal limits  I-STAT CHEM 8, ED - Abnormal; Notable for the following components:   Creatinine, Ser 1.10 (*)    All other components within normal limits  PROTIME-INR  APTT  CBC  DIFFERENTIAL  CBG MONITORING, ED    EKG EKG Interpretation  Date/Time:  Friday June 24 2020 11:18:51 EDT Ventricular Rate:  77 PR Interval:  157 QRS Duration: 102 QT Interval:  390 QTC Calculation: 442 R Axis:   -13 Text Interpretation: Sinus rhythm Abnormal R-wave progression, early transition Confirmed by Thamas Jaegers ([id number]) on 06/24/2020 12:34:55 PM  Radiology CT HEAD CODE STROKE WO CONTRAST  Result Date: 06/24/2020 CLINICAL DATA:  Code stroke. Acute neuro deficit. Left-sided weakness. EXAM: CT HEAD WITHOUT CONTRAST TECHNIQUE: Contiguous axial images were obtained from the base of the skull through the vertex without intravenous contrast. COMPARISON:  CT head 06/30/2019 FINDINGS:  Brain: Mild atrophy. Mild white matter changes bilaterally. Small chronic infarct right occipital lobe unchanged from prior studies. Negative for acute infarct, hemorrhage, mass. Vascular: Negative for hyperdense vessel Skull: Negative Sinuses/Orbits: Paranasal sinuses clear.  Negative orbit Other: None ASPECTS ([place] Stroke Program Early CT Score) - Ganglionic level infarction (caudate, lentiform nuclei, internal capsule, insula, M1-M3  cortex): 7 - Supraganglionic infarction (M4-M6 cortex): 3 Total score (0-10 with 10 being normal): 10 IMPRESSION: 1. No acute abnormality 2. Atrophy and chronic ischemic change.  No intracranial hemorrhage. 3. ASPECTS is 10 4. Code stroke imaging results were communicated on 06/24/2020 at 11:17 am to provider Lorrin Goodell via text page Electronically Signed   By: Franchot Gallo M.D.   On: 06/24/2020 11:18    Procedures .Critical Care  Date/Time: 06/24/2020 1:23 PM Performed by: Luna Fuse, MD Authorized by: Luna Fuse, MD   Critical care provider statement:    Critical care time (minutes):  40   Critical care time was exclusive of:  Separately billable procedures and treating other patients   Critical care was necessary to treat or prevent imminent or life-threatening deterioration of the following conditions:  CNS failure or compromise   Medications Ordered in ED Medications  sodium chloride flush (NS) 0.9 % injection 3 mL (has no administration in time range)  aspirin chewable tablet 162 mg (162 mg Oral Given 06/24/20 1121)    Followed by  aspirin chewable tablet 81 mg (has no administration in time range)  clopidogrel (PLAVIX) tablet 300 mg (300 mg Oral Given 06/24/20 1120)    Followed by  clopidogrel (PLAVIX) tablet 75 mg (has no administration in time range)  0.9 %  sodium chloride infusion (has no administration in time range)  sodium chloride 0.9 % bolus 1,000 mL (1,000 mLs Intravenous New Bag/Given 06/24/20 1213)    ED Course  I have reviewed  the triage vital signs and the nursing notes.  Pertinent labs & imaging results that were available during my care of the patient were reviewed by me and considered in my medical decision making (see chart for details).    MDM Rules/Calculators/A&P                          Stroke alert was activated prior to arrival and we are awaiting arrival by EMS.  Patient was a candidate for tPA given last known well was about 10:30 AM.  However she declined tPA after being explained all the risks and benefits.  Patient was seen by neurology, recommending medical admission and further work-up for stroke evaluation.  Final Clinical Impression(s) / ED Diagnoses Final diagnoses:  Stroke-like episode    Rx / DC Orders ED Discharge Orders     None        Luna Fuse, MD 06/24/20 1324

## 2020-06-25 DIAGNOSIS — Z79899 Other long term (current) drug therapy: Secondary | ICD-10-CM | POA: Diagnosis not present

## 2020-06-25 DIAGNOSIS — I69351 Hemiplegia and hemiparesis following cerebral infarction affecting right dominant side: Secondary | ICD-10-CM | POA: Diagnosis not present

## 2020-06-25 DIAGNOSIS — D696 Thrombocytopenia, unspecified: Secondary | ICD-10-CM | POA: Diagnosis present

## 2020-06-25 DIAGNOSIS — F32A Depression, unspecified: Secondary | ICD-10-CM | POA: Diagnosis present

## 2020-06-25 DIAGNOSIS — F419 Anxiety disorder, unspecified: Secondary | ICD-10-CM | POA: Diagnosis present

## 2020-06-25 DIAGNOSIS — I639 Cerebral infarction, unspecified: Secondary | ICD-10-CM | POA: Diagnosis present

## 2020-06-25 DIAGNOSIS — R29703 NIHSS score 3: Secondary | ICD-10-CM | POA: Diagnosis present

## 2020-06-25 DIAGNOSIS — Z20822 Contact with and (suspected) exposure to covid-19: Secondary | ICD-10-CM | POA: Diagnosis present

## 2020-06-25 DIAGNOSIS — Z91041 Radiographic dye allergy status: Secondary | ICD-10-CM | POA: Diagnosis not present

## 2020-06-25 DIAGNOSIS — I6381 Other cerebral infarction due to occlusion or stenosis of small artery: Secondary | ICD-10-CM | POA: Diagnosis present

## 2020-06-25 DIAGNOSIS — Z7982 Long term (current) use of aspirin: Secondary | ICD-10-CM | POA: Diagnosis not present

## 2020-06-25 DIAGNOSIS — R531 Weakness: Secondary | ICD-10-CM | POA: Diagnosis not present

## 2020-06-25 DIAGNOSIS — E785 Hyperlipidemia, unspecified: Secondary | ICD-10-CM | POA: Diagnosis present

## 2020-06-25 DIAGNOSIS — M810 Age-related osteoporosis without current pathological fracture: Secondary | ICD-10-CM | POA: Diagnosis present

## 2020-06-25 DIAGNOSIS — Z8249 Family history of ischemic heart disease and other diseases of the circulatory system: Secondary | ICD-10-CM | POA: Diagnosis not present

## 2020-06-25 DIAGNOSIS — I63321 Cerebral infarction due to thrombosis of right anterior cerebral artery: Secondary | ICD-10-CM | POA: Diagnosis not present

## 2020-06-25 DIAGNOSIS — Z91013 Allergy to seafood: Secondary | ICD-10-CM | POA: Diagnosis not present

## 2020-06-25 DIAGNOSIS — Z8616 Personal history of COVID-19: Secondary | ICD-10-CM | POA: Diagnosis not present

## 2020-06-25 DIAGNOSIS — I6340 Cerebral infarction due to embolism of unspecified cerebral artery: Secondary | ICD-10-CM | POA: Diagnosis not present

## 2020-06-25 DIAGNOSIS — G8194 Hemiplegia, unspecified affecting left nondominant side: Secondary | ICD-10-CM | POA: Diagnosis present

## 2020-06-25 DIAGNOSIS — Z888 Allergy status to other drugs, medicaments and biological substances status: Secondary | ICD-10-CM | POA: Diagnosis not present

## 2020-06-25 DIAGNOSIS — R29810 Facial weakness: Secondary | ICD-10-CM | POA: Diagnosis present

## 2020-06-25 DIAGNOSIS — Z881 Allergy status to other antibiotic agents status: Secondary | ICD-10-CM | POA: Diagnosis not present

## 2020-06-25 DIAGNOSIS — I10 Essential (primary) hypertension: Secondary | ICD-10-CM | POA: Diagnosis present

## 2020-06-25 DIAGNOSIS — F0150 Vascular dementia without behavioral disturbance: Secondary | ICD-10-CM | POA: Diagnosis present

## 2020-06-25 DIAGNOSIS — Z8673 Personal history of transient ischemic attack (TIA), and cerebral infarction without residual deficits: Secondary | ICD-10-CM | POA: Diagnosis not present

## 2020-06-25 DIAGNOSIS — I63423 Cerebral infarction due to embolism of bilateral anterior cerebral arteries: Secondary | ICD-10-CM | POA: Diagnosis not present

## 2020-06-25 DIAGNOSIS — I63521 Cerebral infarction due to unspecified occlusion or stenosis of right anterior cerebral artery: Secondary | ICD-10-CM | POA: Diagnosis not present

## 2020-06-25 LAB — LIPID PANEL
Cholesterol: 164 mg/dL (ref 0–200)
HDL: 45 mg/dL (ref >40)
LDL Cholesterol: 108 mg/dL — ABNORMAL HIGH (ref 0–99)
Total CHOL/HDL Ratio: 3.6 RATIO
Triglycerides: 56 mg/dL (ref <150)
VLDL: 11 mg/dL (ref 0–40)

## 2020-06-25 LAB — HIV ANTIBODY (ROUTINE TESTING W REFLEX): HIV Screen 4th Generation wRfx: NONREACTIVE

## 2020-06-25 LAB — HEMOGLOBIN A1C
Hgb A1c MFr Bld: 5.8 % — ABNORMAL HIGH (ref 4.8–5.6)
Mean Plasma Glucose: 119.76 mg/dL

## 2020-06-25 MED ORDER — ATORVASTATIN CALCIUM 40 MG PO TABS
40.0000 mg | ORAL_TABLET | Freq: Every day | ORAL | Status: DC
  Administered 2020-06-25 – 2020-06-28 (×4): 40 mg via ORAL
  Filled 2020-06-25 (×4): qty 1

## 2020-06-25 NOTE — Evaluation (Signed)
Physical Therapy Evaluation Patient Details Name: Jacqueline Bonilla MRN: [id number] DOB: 21-Jun-1955 Today's Date: 06/25/2020   History of Present Illness  v59 y.o. female [facility] ED on 06/24/2020 with L weakness and facial droop. CT head negative, MRI found punctate acute infarct in high L frontal cortex and multiple remote cortical infarcts in bilateral frontal, parietal, and occipital lobes and multile bilateral cerebellar lacunar infarcts. PMH includes CVA, vascular dementia, HTN, thrombocytopenia and ILR.  Clinical Impression  PTA, patient lives with husband and reports ambulating without AD. Per chart, patient NIH is 0, however patient presents with significant deficits related to motor and coordination. Patient requires modA for ambulation with RW, +2 for safety. Patient presents with functional weakness, impaired balance, impaired coordination, impaired cognition, decreased activity tolerance, and impaired functional mobility. Patient will benefit from skilled PT services during acute stay to address listed deficits. Recommend CIR for intensive therapies at discharge to assist with maximizing functional independence and safety prior to returning home.     Follow Up Recommendations CIR    Equipment Recommendations  Rolling Shadow Stiggers with 5" wheels    Recommendations for Other Services Rehab consult     Precautions / Restrictions Precautions Precautions: Fall Precaution Comments: ataxic, apraxia? Restrictions Weight Bearing Restrictions: No      Mobility  Bed Mobility Overal bed mobility: Needs Assistance Bed Mobility: Supine to Sit     Supine to sit: Min assist     General bed mobility comments: up in recliner on arrival    Transfers Overall transfer level: Needs assistance Equipment used: None Transfers: Sit to/from Stand Sit to Stand: Mod assist         General transfer comment: required momentum and multiple attempts to achieve standing from recliner.  Posteior and R lateral lean noted  Ambulation/Gait Ambulation/Gait assistance: Mod assist;Max assist Gait Distance (Feet): 5 Feet Assistive device: Rolling Deserai Cansler (2 wheeled) Gait Pattern/deviations: Step-to pattern;Decreased step length - right;Decreased step length - left;Decreased stance time - left;Decreased stride length;Decreased weight shift to left;Shuffle;Ataxic;Trunk flexed;Narrow base of support Gait velocity: decreased   General Gait Details: Difficulty advancing L LE with ambulation however able to perform L hip flexion in static standing. Tends to slide L foot forward. Demos difficulty processing movement and coordinating (apraxia?). Mod-maxA to maintain balance with ambulation due to posterior lean and intermittent knee buckling  Stairs            Wheelchair Mobility    Modified Rankin (Stroke Patients Only) Modified Rankin (Stroke Patients Only) Pre-Morbid Rankin Score: Moderate disability Modified Rankin: Moderately severe disability     Balance Overall balance assessment: Needs assistance Sitting-balance support: Feet supported Sitting balance-Leahy Scale: Poor   Postural control: Posterior lean Standing balance support: Bilateral upper extremity supported;Single extremity supported Standing balance-Leahy Scale: Poor Standing balance comment: needs external support                             Pertinent Vitals/Pain Pain Assessment: No/denies pain    Home Living Family/patient expects to be discharged to:: Private residence Living Arrangements: Spouse/significant other Available Help at Discharge: Family;Available 24 hours/day Type of Home: House Home Access: Stairs to enter Entrance Stairs-Rails: None Entrance Stairs-Number of Steps: 4 Home Layout: One level Home Equipment: Cane - single point      Prior Function Level of Independence: Independent with assistive device(s)         Comments: Spouse completes all meal prep,  community mobility, IADL,  bill payment and med management.  She walked without an AD, and was able to perfrom her own self care. Active at the gym     Hand Dominance   Dominant Hand: Right    Extremity/Trunk Assessment   Upper Extremity Assessment Upper Extremity Assessment: Defer to OT evaluation    Lower Extremity Assessment Lower Extremity Assessment: LLE deficits/detail LLE Deficits / Details: MMT 4+/5, functionally weak which may be due to ataxia or apraxia? LLE Coordination: decreased gross motor;decreased fine motor    Cervical / Trunk Assessment Cervical / Trunk Assessment: Normal  Communication   Communication: No difficulties  Cognition Arousal/Alertness: Awake/alert Behavior During Therapy: WFL for tasks assessed/performed Overall Cognitive Status: History of cognitive impairments - at baseline                                 General Comments: difficulty with memory, processing and complex though.  does follow commands with increased time.      General Comments      Exercises     Assessment/Plan    PT Assessment Patient needs continued PT services  PT Problem List Decreased strength;Decreased activity tolerance;Decreased balance;Decreased mobility;Decreased coordination;Decreased cognition;Decreased knowledge of use of DME;Decreased safety awareness;Decreased knowledge of precautions       PT Treatment Interventions DME instruction;Gait training;Stair training;Functional mobility training;Therapeutic activities;Therapeutic exercise;Neuromuscular re-education;Balance training;Patient/family education    PT Goals (Current goals can be found in the Care Plan section)  Acute Rehab PT Goals Patient Stated Goal: I need to move better PT Goal Formulation: With patient Time For Goal Achievement: 07/09/20 Potential to Achieve Goals: Fair    Frequency Min 4X/week   Barriers to discharge        Co-evaluation               AM-PAC PT "6  Clicks" Mobility  Outcome Measure Help needed turning from your back to your side while in a flat bed without using bedrails?: A Little Help needed moving from lying on your back to sitting on the side of a flat bed without using bedrails?: A Little Help needed moving to and from a bed to a chair (including a wheelchair)?: A Lot Help needed standing up from a chair using your arms (e.g., wheelchair or bedside chair)?: A Lot Help needed to walk in hospital room?: A Lot Help needed climbing 3-5 steps with a railing? : Total 6 Click Score: 13    End of Session Equipment Utilized During Treatment: Gait belt Activity Tolerance: Patient tolerated treatment well Patient left: in chair;with call bell/phone within reach;with chair alarm set Nurse Communication: Mobility status PT Visit Diagnosis: Unsteadiness on feet (R26.81);Muscle weakness (generalized) (M62.81);Other abnormalities of gait and mobility (R26.89);Ataxic gait (R26.0);Difficulty in walking, not elsewhere classified (R26.2);Other symptoms and signs involving the nervous system (R29.898);Apraxia (R48.2)    Time: 2229-7989 PT Time Calculation (min) (ACUTE ONLY): 14 min   Charges:   PT Evaluation $PT Eval Moderate Complexity: 1 Mod          Kathryn Linarez A. Gilford Rile PT, DPT Acute Rehabilitation Services Pager [phone] Office [phone]   Linna Hoff 06/25/2020, 9:28 AM

## 2020-06-25 NOTE — Progress Notes (Signed)
PROGRESS NOTE    AISLYNN CIFELLI  YKZ:[id number] DOB: Jan 18, 1955 DOA: 06/24/2020 PCP: Horald Pollen, MD   Brief Narrative:  DARIANNA AMY is a 65 y.o. female with medical history significant of past CVA, vascular dementia, HTN, thrombocytopenia and ILR who presented for left sided weakness and facial droop.  She was at gym exercising on the elliptical and when she went to get off she fell to the floor.  She had sudden left sided weakness and left facial droop. She believes this happened around 10AM. EMS was called. She denies any slurred speech or word finding difficulties and states she does not recall any left arm weakness, but this was reported by EMS.  She can not tell me if her symptoms have improved. She states she tried to stand up and go to the bathroom and was having to lean very heavily on aide so they put her back in bed. She denies any headaches, vision changes, chest pain, palpitations, fever, abdominal pain, N/V/D. She denies any loss of sensation. She is right handed.   Assessment & Plan:   Principal Problem:   Acute left-sided weakness Active Problems:   Essential hypertension   Cerebrovascular accident [facility])   Vascular dementia without behavioral disturbance ([place])    Acute CVA with left-sided weakness Stroke work up initiated and neurology on board. She declined tpa in ER -MRI/MRA completed -echo completed, no acute findings -carotid dopplers 1-39 bilat -a1c 5.8 -lipid panel LDL above goal, c/w new statin -PT/OT/ST to eval-CIR -ASA 325mg  and plavix 300mg  given x 1 today. Will continue ASA 81mg  daily and plavix 75mg  daily x 3 weeks then asa 81 mg alone. -telemetry    Active Problems:   Essential hypertension -allow permissive HTN x 24 hours. Systolic <939. Will start home medication of losartaN TODAY     Cerebrovascular accident [facility]) Past hx of CVA. Per neurology recommendation continue ASA and plavix. Already has neurologist outpatient.       Vascular dementia without behavioral disturbance (HCC) -appears to be at baseline. Continue f/u with outpatient neurologist.   Thrombocytopenia -wnl today, monitor. F/u outpatient as needed.   Elevated creatinine Light IVF x 24 hours or less if she passes swallow screen. Repeat labs in AM.    Body mass index is 19.83 kg/m.  DVT prophylaxis: Lovenox SQ  Code Status: FULL    Code Status Orders  (From admission, onward)           Start     Ordered   06/24/20 1327  Full code  Continuous        06/24/20 1331           Code Status History     This patient has a current code status but no historical code status.      Family Communication: CALLED AND DISCUSSED plan of care with husband Disposition Plan: The CIR  Consults called:  Neurology Admission status: Inpatient Patient requires continued telemetry monitoring, intensive physical therapy, neurologic intervention, and likely short-term rehab for intensive physical therapy secondary to NEW stroke related deficits.  Currently patient's not safe for discharge and is unstable and high risk of fall and injury  Consultants:  As above  Procedures:  MR ANGIO HEAD WO CONTRAST  Result Date: 06/24/2020 CLINICAL DATA:  Neuro deficit, acute stroke suspected EXAM: MRI HEAD WITHOUT CONTRAST MRA HEAD WITHOUT CONTRAST TECHNIQUE: Multiplanar, multi-echo pulse sequences of the brain and surrounding structures were acquired without intravenous contrast. Angiographic images of the [place] were acquired using MRA technique without intravenous contrast. COMPARISON:  And a CT code stroke. MRI head June 23, 2019. CTA June 30 2019. FINDINGS: MRI HEAD FINDINGS Brain: Punctate focus of restricted diffusion in the high left frontal cortex (series 3, image 38). No significant edema or mass effect. Additional punctate areas of faint DWI signal in bilateral precentral gyri (series 3, images 37 and 39). Redemonstrated multiple remote cortical  infarcts in bilateral frontal parietal and occipital lobes. Multiple bilateral cerebellar lacunar infarcts. Moderate scattered patchy confluent T2/FLAIR hyperintensities within the white matter most likely related to chronic microvascular ischemic disease. No hydrocephalus. Age advanced moderate cerebral atrophy with ex vacuo ventricular dilation. No acute hemorrhage. No mass lesion or abnormal mass effect. No extra-axial fluid collection. Vascular: See below. Skull and upper cervical spine: Normal marrow signal. Sinuses/Orbits: Clear sinuses.  Unremarkable orbits. Other: No sizable mastoid effusions. MRA HEAD FINDINGS Anterior circulation: Bilateral ICAs, MCAs, and ACAs are patent without flow-limiting proximal stenosis. No aneurysm identified. Posterior circulation: The visualized intradural vertebral arteries, basilar artery and posterior cerebral arteries are patent without flow limiting proximal stenosis. No aneurysm identified. IMPRESSION: MRI: 1. Punctate acute infarct in the high left frontal cortex. Additional faint punctate areas of DWI signal in bilateral precentral gyri are suspicious for additional acute or subacute infarcts, but could potentially represent artifact. Given potential involvement of multiple vascular territories, consider a central embolic etiology. 2. 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 3. Moderate atrophy. MRA: No large vessel occlusion or proximal flow limiting stenosis. Electronically Signed   By: Margaretha Sheffield MD   On: 06/24/2020 20:15   MR BRAIN WO CONTRAST  Result Date: 06/24/2020 CLINICAL DATA:  Neuro deficit, acute stroke suspected EXAM: MRI HEAD WITHOUT CONTRAST MRA HEAD WITHOUT CONTRAST TECHNIQUE: Multiplanar, multi-echo pulse sequences of the brain and surrounding structures were acquired without intravenous contrast. Angiographic images of the [place] were  acquired using MRA technique without intravenous contrast. COMPARISON:  And a CT code stroke. MRI head June 23, 2019. CTA June 30 2019. FINDINGS: MRI HEAD FINDINGS Brain: Punctate focus of restricted diffusion in the high left frontal cortex (series 3, image 38). No significant edema or mass effect. Additional punctate areas of faint DWI signal in bilateral precentral gyri (series 3, images 37 and 39). Redemonstrated multiple remote cortical infarcts in bilateral frontal parietal and occipital lobes. Multiple bilateral cerebellar lacunar infarcts. Moderate scattered patchy confluent T2/FLAIR hyperintensities within the white matter most likely related to chronic microvascular ischemic disease. No hydrocephalus. Age advanced moderate cerebral atrophy with ex vacuo ventricular dilation. No acute hemorrhage. No mass lesion or abnormal mass effect. No extra-axial fluid collection. Vascular: See below. Skull and upper cervical spine: Normal marrow signal. Sinuses/Orbits: Clear sinuses.  Unremarkable orbits. Other: No sizable mastoid effusions. MRA HEAD FINDINGS Anterior circulation: Bilateral ICAs, MCAs, and ACAs are patent without flow-limiting proximal stenosis. No aneurysm identified. Posterior circulation: The visualized intradural vertebral arteries, basilar artery and posterior cerebral arteries are patent without flow limiting proximal stenosis. No aneurysm identified. IMPRESSION: MRI: 1. Punctate acute infarct in the high left frontal cortex. Additional faint punctate areas of DWI signal in bilateral precentral gyri are suspicious for additional acute or subacute infarcts, but could potentially represent artifact. Given potential involvement of multiple vascular territories, consider a central embolic etiology. 2. 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 3. Moderate atrophy. MRA:  No large vessel occlusion or proximal  flow limiting stenosis. Electronically Signed   By: Margaretha Sheffield MD   On: 06/24/2020 20:15   ECHOCARDIOGRAM COMPLETE  Result Date: 06/24/2020    ECHOCARDIOGRAM REPORT   Patient Name:   [facility] [place] Date of Exam: 06/24/2020 Medical Rec #:  [id number]          Height:       64.0 in Accession #:    [id number]         Weight:       115.5 lb Date of Birth:  [birth year]          BSA:          1.549 m Patient Age:    71 years           BP:           162/86 mmHg Patient Gender: F                  HR:           69 bpm. Exam Location:  Inpatient Procedure: 2D Echo, Cardiac Doppler and Color Doppler Indications:    CVA  History:        Patient has prior history of Echocardiogram examinations.                 Stroke, Signs/Symptoms:Dementia; Risk Factors:Hypertension.  Sonographer:    Dustin Flock Referring Phys: [id number] Orma Flaming  Sonographer Comments: Image acquisition challenging due to respiratory motion. IMPRESSIONS  1. Left ventricular ejection fraction, by estimation, is 55 to 60%. The left ventricle has normal function. The left ventricle has no regional wall motion abnormalities. Left ventricular diastolic parameters are consistent with Grade I diastolic dysfunction (impaired relaxation).  2. Right ventricular systolic function is normal. The right ventricular size is normal. There is normal pulmonary artery systolic pressure.  3. The mitral valve is normal in structure. Trivial mitral valve regurgitation. No evidence of mitral stenosis.  4. The aortic valve is tricuspid. Aortic valve regurgitation is not visualized. No aortic stenosis is present.  5. The inferior vena cava is normal in size with greater than 50% respiratory variability, suggesting right atrial pressure of 3 mmHg. FINDINGS  Left Ventricle: Left ventricular ejection fraction, by estimation, is 55 to 60%. The left ventricle has normal function. The left ventricle has no regional wall motion abnormalities. The left ventricular  internal cavity size was normal in size. There is  no left ventricular hypertrophy. Left ventricular diastolic parameters are consistent with Grade I diastolic dysfunction (impaired relaxation). Right Ventricle: The right ventricular size is normal. Right ventricular systolic function is normal. There is normal pulmonary artery systolic pressure. The tricuspid regurgitant velocity is 2.14 m/s, and with an assumed right atrial pressure of 3 mmHg,  the estimated right ventricular systolic pressure is 98.3 mmHg. Left Atrium: Left atrial size was normal in size. Right Atrium: Right atrial size was normal in size. Pericardium: There is no evidence of pericardial effusion. Mitral Valve: The mitral valve is normal in structure. Trivial mitral valve regurgitation. No evidence of mitral valve stenosis. Tricuspid Valve: The tricuspid valve is normal in structure. Tricuspid valve regurgitation is trivial. No evidence of tricuspid stenosis. Aortic Valve: The aortic valve is tricuspid. Aortic valve regurgitation is not visualized. No aortic stenosis is present. Pulmonic Valve: The pulmonic valve was not well visualized. Pulmonic valve regurgitation is trivial. No evidence of pulmonic stenosis. Aorta: The aortic root is normal in size and structure.  Venous: The inferior vena cava is normal in size with greater than 50% respiratory variability, suggesting right atrial pressure of 3 mmHg. IAS/Shunts: No atrial level shunt detected by color flow Doppler.  LEFT VENTRICLE PLAX 2D LVIDd:         3.70 cm  Diastology LVIDs:         2.60 cm  LV e' medial:    5.55 cm/s LV PW:         0.90 cm  LV E/e' medial:  11.2 LV IVS:        1.00 cm  LV e' lateral:   10.60 cm/s LVOT diam:     1.80 cm  LV E/e' lateral: 5.9 LV SV:         41 LV SV Index:   26 LVOT Area:     2.54 cm  RIGHT VENTRICLE RV Basal diam:  2.50 cm RV S prime:     10.70 cm/s TAPSE (M-mode): 2.0 cm LEFT ATRIUM             Index       RIGHT ATRIUM          Index LA diam:         2.60 cm 1.68 cm/m  RA Area:     9.97 cm LA Vol (A2C):   36.1 ml 23.30 ml/m RA Volume:   23.00 ml 14.85 ml/m LA Vol (A4C):   21.8 ml 14.07 ml/m LA Biplane Vol: 29.6 ml 19.11 ml/m  AORTIC VALVE LVOT Vmax:   85.00 cm/s LVOT Vmean:  52.100 cm/s LVOT VTI:    0.160 m  AORTA Ao Root diam: 2.60 cm MITRAL VALVE               TRICUSPID VALVE MV Area (PHT): 3.12 cm    TR Peak grad:   18.3 mmHg MV Decel Time: 243 msec    TR Vmax:        214.00 cm/s MV E velocity: 62.20 cm/s MV A velocity: 72.10 cm/s  SHUNTS MV E/A ratio:  0.86        Systemic VTI:  0.16 m                            Systemic Diam: 1.80 cm Kirk Ruths MD Electronically signed by Kirk Ruths MD Signature Date/Time: 06/24/2020/2:41:59 PM    Final    CUP PACEART REMOTE DEVICE CHECK  Result Date: 06/09/2020 ILR summary report received. Battery status OK. Normal device function. No new symptom, tachy, brady, or pause episodes. No new AF episodes. Monthly summary reports and ROV/PRN Kathy Breach, RN, CCDS, [employer]  CT HEAD CODE STROKE WO CONTRAST  Result Date: 06/24/2020 CLINICAL DATA:  Code stroke. Acute neuro deficit. Left-sided weakness. EXAM: CT HEAD WITHOUT CONTRAST TECHNIQUE: Contiguous axial images were obtained from the base of the skull through the vertex without intravenous contrast. COMPARISON:  CT head 06/30/2019 FINDINGS: Brain: Mild atrophy. Mild white matter changes bilaterally. Small chronic infarct right occipital lobe unchanged from prior studies. Negative for acute infarct, hemorrhage, mass. Vascular: Negative for hyperdense vessel Skull: Negative Sinuses/Orbits: Paranasal sinuses clear.  Negative orbit Other: None ASPECTS ([place] Stroke Program Early CT Score) - Ganglionic level infarction (caudate, lentiform nuclei, internal capsule, insula, M1-M3 cortex): 7 - Supraganglionic infarction (M4-M6 cortex): 3 Total score (0-10 with 10 being normal): 10 IMPRESSION: 1. No acute abnormality 2. Atrophy and chronic ischemic  change.  No intracranial hemorrhage.  3. ASPECTS is 10 4. Code stroke imaging results were communicated on 06/24/2020 at 11:17 am to provider Lorrin Goodell via text page Electronically Signed   By: Franchot Gallo M.D.   On: 06/24/2020 11:18   VAS US CAROTID (at [facility] and [facility] only)  Result Date: 06/24/2020 Carotid Arterial Duplex Study Patient Name:  [facility] [place]  Date of Exam:   06/24/2020 Medical Rec #: [id number]           Accession #:    [id number] Date of Birth: Apr 21, 1955           Patient Gender: F Patient Age:   65Y Exam Location:  [facility] Procedure:      VAS US CAROTID Referring Phys: [id number] Orma Flaming --------------------------------------------------------------------------------  Indications:       CVA and Left sided weakness and facial droop. Risk Factors:      Hypertension, no history of smoking, prior CVA. Other Factors:     Vascular dementia. Comparison Study:  CTA from 06/2919 WNL Performing Technologist: Rogelia Rohrer RVT, RDMS  Examination Guidelines: A complete evaluation includes B-mode imaging, spectral Doppler, color Doppler, and power Doppler as needed of all accessible portions of each vessel. Bilateral testing is considered an integral part of a complete examination. Limited examinations for reoccurring indications may be performed as noted.  Right Carotid Findings: +----------+--------+--------+--------+------------------+------------------+           PSV cm/sEDV cm/sStenosisPlaque DescriptionComments           +----------+--------+--------+--------+------------------+------------------+ CCA Prox  67      14                                intimal thickening +----------+--------+--------+--------+------------------+------------------+ CCA Distal76      16                                intimal thickening +----------+--------+--------+--------+------------------+------------------+ ICA Prox  59      20                                                    +----------+--------+--------+--------+------------------+------------------+ ICA Distal70      27                                                   +----------+--------+--------+--------+------------------+------------------+ ECA       70      13                                                   +----------+--------+--------+--------+------------------+------------------+ +----------+--------+-------+----------------+-------------------+           PSV cm/sEDV cmsDescribe        Arm Pressure (mmHG) +----------+--------+-------+----------------+-------------------+ [id number]             Multiphasic, WNL                    +----------+--------+-------+----------------+-------------------+ +---------+--------+--+--------+--+---------+ VertebralPSV cm/s38EDV cm/s11Antegrade +---------+--------+--+--------+--+---------+  Left Carotid Findings: +----------+--------+--------+--------+------------------+------------------+  PSV cm/sEDV cm/sStenosisPlaque DescriptionComments           +----------+--------+--------+--------+------------------+------------------+ CCA Prox  73      13                                intimal thickening +----------+--------+--------+--------+------------------+------------------+ CCA Distal85      22                                intimal thickening +----------+--------+--------+--------+------------------+------------------+ ICA Prox  59      22                                                   +----------+--------+--------+--------+------------------+------------------+ ICA Distal63      23                                                   +----------+--------+--------+--------+------------------+------------------+ ECA       81      11                                                   +----------+--------+--------+--------+------------------+------------------+  +----------+--------+--------+----------------+-------------------+           PSV cm/sEDV cm/sDescribe        Arm Pressure (mmHG) +----------+--------+--------+----------------+-------------------+ [id number]             Multiphasic, WNL                    +----------+--------+--------+----------------+-------------------+ +---------+--------+--+--------+--+---------+ VertebralPSV cm/s46EDV cm/s11Antegrade +---------+--------+--+--------+--+---------+   Summary: Right Carotid: Velocities in the right ICA are consistent with a 1-39% stenosis. Left Carotid: Velocities in the left ICA are consistent with a 1-39% stenosis. Vertebrals:  Bilateral vertebral arteries demonstrate antegrade flow. Subclavians: Normal flow hemodynamics were seen in bilateral subclavian              arteries. *See table(s) above for measurements and observations.     Preliminary        Subjective: Patient reports still very weak with left foot high risk of falls Also reported chronic residual weakness right upper and lower extremity from previous stroke  Objective: Vitals:   06/25/20 0020 06/25/20 0431 06/25/20 0757 06/25/20 1136  BP: (!) 143/92 (!) 162/94 (!) 161/97 130/81  Pulse: 76 85 89 (!) 107  Resp: 18 18 18 17   Temp: 97.9 F (36.6 C) 98.3 F (36.8 C) 98.7 F (37.1 C) 99.3 F (37.4 C)  TempSrc: Oral Oral Oral Oral  SpO2: 100% 100% 100% 99%  Weight:      Height:        Intake/Output Summary (Last 24 hours) at 06/25/2020 1406 Last data filed at 06/25/2020 0900 Gross per 24 hour  Intake 1139.75 ml  Output 1200 ml  Net -60.25 ml   Filed Weights   06/24/20 1100  Weight: 52.4 kg    Examination:  General exam: Appears calm and comfortable  Respiratory system: Clear to auscultation. Respiratory effort normal. Cardiovascular  system: S1 & S2 heard, RRR. No JVD, murmurs, rubs, gallops or clicks. No pedal edema. Gastrointestinal system: Abdomen is nondistended, soft and nontender. No  organomegaly or masses felt. Normal bowel sounds heard. Central nervous system: Alert and oriented. No focal neurological deficits. Extremities: Left lower extremity from the knee down 3 out of 5 bilateral upper extremities 5 out of 5 Skin: No rashes, lesions or ulcers Psychiatry: Judgement and insight appear normal. Mood & affect appropriate.     Data Reviewed: I have personally reviewed following labs and imaging studies  CBC: Recent Labs  Lab 06/24/20 1100 06/24/20 1106  WBC 6.3  --   NEUTROABS 4.5  --   HGB 13.1 14.3  HCT 40.4 42.0  MCV 92.2  --   PLT 214  --    Basic Metabolic Panel: Recent Labs  Lab 06/24/20 1100 06/24/20 1106  NA 138 139  K 4.2 4.1  CL 103 102  CO2 28  --   GLUCOSE 97 95  BUN 17 19  CREATININE 1.05* 1.10*  CALCIUM 9.4  --    GFR: Estimated Creatinine Clearance: 41.1 mL/min (A) (by C-G formula based on SCr of 1.1 mg/dL (H)). Liver Function Tests: Recent Labs  Lab 06/24/20 1100  AST 26  ALT 19  ALKPHOS 62  BILITOT 0.7  PROT 7.4  ALBUMIN 3.7   No results for input(s): LIPASE, AMYLASE in the last 168 hours. No results for input(s): AMMONIA in the last 168 hours. Coagulation Profile: Recent Labs  Lab 06/24/20 1100  INR 1.0   Cardiac Enzymes: No results for input(s): CKTOTAL, CKMB, CKMBINDEX, TROPONINI in the last 168 hours. BNP (last 3 results) No results for input(s): PROBNP in the last 8760 hours. HbA1C: Recent Labs    06/25/20 0447  HGBA1C 5.8*   CBG: Recent Labs  Lab 06/24/20 1103  GLUCAP 80   Lipid Profile: Recent Labs    06/25/20 0447  CHOL 164  HDL 45  LDLCALC 108*  TRIG 56  CHOLHDL 3.6   Thyroid Function Tests: No results for input(s): TSH, T4TOTAL, FREET4, T3FREE, THYROIDAB in the last 72 hours. Anemia Panel: No results for input(s): VITAMINB12, FOLATE, FERRITIN, TIBC, IRON, RETICCTPCT in the last 72 hours. Sepsis Labs: No results for input(s): PROCALCITON, LATICACIDVEN in the last 168 hours.  Recent  Results (from the past 240 hour(s))  SARS CORONAVIRUS 2 (TAT 6-24 HRS) Nasopharyngeal Nasopharyngeal Swab     Status: None   Collection Time: 06/24/20  2:08 PM   Specimen: Nasopharyngeal Swab  Result Value Ref Range Status   SARS Coronavirus 2 NEGATIVE NEGATIVE Final    Comment: (NOTE) SARS-CoV-2 target nucleic acids are NOT DETECTED.  The SARS-CoV-2 RNA is generally detectable in upper and lower respiratory specimens during the acute phase of infection. Negative results do not preclude SARS-CoV-2 infection, do not rule out co-infections with other pathogens, and should not be used as the sole basis for treatment or other patient management decisions. Negative results must be combined with clinical observations, patient history, and epidemiological information. The expected result is Negative.  Fact Sheet for Patients: SugarRoll.be  Fact Sheet for Healthcare Providers: https://www.woods-mathews.com/  This test is not yet approved or cleared by the [place] FDA and  has been authorized for detection and/or diagnosis of SARS-CoV-2 by FDA under an Emergency Use Authorization (EUA). This EUA will remain  in effect (meaning this test can be used) for the duration of the COVID-19 declaration under Se ction 564(b)(1) of the Act, 21  U.S.C. section 360bbb-3(b)(1), unless the authorization is terminated or revoked sooner.  Performed at [facility], [place] [address], [place], [place] [address]          Radiology Studies: MR ANGIO HEAD WO CONTRAST  Result Date: 06/24/2020 CLINICAL DATA:  Neuro deficit, acute stroke suspected EXAM: MRI HEAD WITHOUT CONTRAST MRA HEAD WITHOUT CONTRAST TECHNIQUE: Multiplanar, multi-echo pulse sequences of the brain and surrounding structures were acquired without intravenous contrast. Angiographic images of the Circle of [place] were acquired using MRA technique without intravenous contrast. COMPARISON:   And a CT code stroke. MRI head June 23, 2019. CTA June 30 2019. FINDINGS: MRI HEAD FINDINGS Brain: Punctate focus of restricted diffusion in the high left frontal cortex (series 3, image 38). No significant edema or mass effect. Additional punctate areas of faint DWI signal in bilateral precentral gyri (series 3, images 37 and 39). Redemonstrated multiple remote cortical infarcts in bilateral frontal parietal and occipital lobes. Multiple bilateral cerebellar lacunar infarcts. Moderate scattered patchy confluent T2/FLAIR hyperintensities within the white matter most likely related to chronic microvascular ischemic disease. No hydrocephalus. Age advanced moderate cerebral atrophy with ex vacuo ventricular dilation. No acute hemorrhage. No mass lesion or abnormal mass effect. No extra-axial fluid collection. Vascular: See below. Skull and upper cervical spine: Normal marrow signal. Sinuses/Orbits: Clear sinuses.  Unremarkable orbits. Other: No sizable mastoid effusions. MRA HEAD FINDINGS Anterior circulation: Bilateral ICAs, MCAs, and ACAs are patent without flow-limiting proximal stenosis. No aneurysm identified. Posterior circulation: The visualized intradural vertebral arteries, basilar artery and posterior cerebral arteries are patent without flow limiting proximal stenosis. No aneurysm identified. IMPRESSION: MRI: 1. Punctate acute infarct in the high left frontal cortex. Additional faint punctate areas of DWI signal in bilateral precentral gyri are suspicious for additional acute or subacute infarcts, but could potentially represent artifact. Given potential involvement of multiple vascular territories, consider a central embolic etiology. 2. 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 3. Moderate atrophy. MRA: No large vessel occlusion or proximal flow limiting stenosis. Electronically Signed   By: Margaretha Sheffield MD    On: 06/24/2020 20:15   MR BRAIN WO CONTRAST  Result Date: 06/24/2020 CLINICAL DATA:  Neuro deficit, acute stroke suspected EXAM: MRI HEAD WITHOUT CONTRAST MRA HEAD WITHOUT CONTRAST TECHNIQUE: Multiplanar, multi-echo pulse sequences of the brain and surrounding structures were acquired without intravenous contrast. Angiographic images of the Circle of Willis were acquired using MRA technique without intravenous contrast. COMPARISON:  And a CT code stroke. MRI head June 23, 2019. CTA June 30 2019. FINDINGS: MRI HEAD FINDINGS Brain: Punctate focus of restricted diffusion in the high left frontal cortex (series 3, image 38). No significant edema or mass effect. Additional punctate areas of faint DWI signal in bilateral precentral gyri (series 3, images 37 and 39). Redemonstrated multiple remote cortical infarcts in bilateral frontal parietal and occipital lobes. Multiple bilateral cerebellar lacunar infarcts. Moderate scattered patchy confluent T2/FLAIR hyperintensities within the white matter most likely related to chronic microvascular ischemic disease. No hydrocephalus. Age advanced moderate cerebral atrophy with ex vacuo ventricular dilation. No acute hemorrhage. No mass lesion or abnormal mass effect. No extra-axial fluid collection. Vascular: See below. Skull and upper cervical spine: Normal marrow signal. Sinuses/Orbits: Clear sinuses.  Unremarkable orbits. Other: No sizable mastoid effusions. MRA HEAD FINDINGS Anterior circulation: Bilateral ICAs, MCAs, and ACAs are patent without flow-limiting proximal stenosis. No aneurysm identified. Posterior circulation: The visualized intradural vertebral arteries, basilar artery  and posterior cerebral arteries are patent without flow limiting proximal stenosis. No aneurysm identified. IMPRESSION: MRI: 1. Punctate acute infarct in the high left frontal cortex. Additional faint punctate areas of DWI signal in bilateral precentral gyri are suspicious for additional  acute or subacute infarcts, but could potentially represent artifact. Given potential involvement of multiple vascular territories, consider a central embolic etiology. 2. 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 3. Moderate atrophy. MRA: No large vessel occlusion or proximal flow limiting stenosis. Electronically Signed   By: Margaretha Sheffield MD   On: 06/24/2020 20:15   ECHOCARDIOGRAM COMPLETE  Result Date: 06/24/2020    ECHOCARDIOGRAM REPORT   Patient Name:   [facility] [place] Date of Exam: 06/24/2020 Medical Rec #:  [id number]          Height:       64.0 in Accession #:    [id number]         Weight:       115.5 lb Date of Birth:  [birth year]          BSA:          1.549 m Patient Age:    50 years           BP:           162/86 mmHg Patient Gender: F                  HR:           69 bpm. Exam Location:  Inpatient Procedure: 2D Echo, Cardiac Doppler and Color Doppler Indications:    CVA  History:        Patient has prior history of Echocardiogram examinations.                 Stroke, Signs/Symptoms:Dementia; Risk Factors:Hypertension.  Sonographer:    Dustin Flock Referring Phys: [id number] Orma Flaming  Sonographer Comments: Image acquisition challenging due to respiratory motion. IMPRESSIONS  1. Left ventricular ejection fraction, by estimation, is 55 to 60%. The left ventricle has normal function. The left ventricle has no regional wall motion abnormalities. Left ventricular diastolic parameters are consistent with Grade I diastolic dysfunction (impaired relaxation).  2. Right ventricular systolic function is normal. The right ventricular size is normal. There is normal pulmonary artery systolic pressure.  3. The mitral valve is normal in structure. Trivial mitral valve regurgitation. No evidence of mitral stenosis.  4. The aortic valve is tricuspid. Aortic valve regurgitation is not visualized. No aortic  stenosis is present.  5. The inferior vena cava is normal in size with greater than 50% respiratory variability, suggesting right atrial pressure of 3 mmHg. FINDINGS  Left Ventricle: Left ventricular ejection fraction, by estimation, is 55 to 60%. The left ventricle has normal function. The left ventricle has no regional wall motion abnormalities. The left ventricular internal cavity size was normal in size. There is  no left ventricular hypertrophy. Left ventricular diastolic parameters are consistent with Grade I diastolic dysfunction (impaired relaxation). Right Ventricle: The right ventricular size is normal. Right ventricular systolic function is normal. There is normal pulmonary artery systolic pressure. The tricuspid regurgitant velocity is 2.14 m/s, and with an assumed right atrial pressure of 3 mmHg,  the estimated right ventricular systolic pressure is 74.1 mmHg. Left Atrium: Left atrial size was normal in size. Right Atrium: Right atrial size was normal in size. Pericardium: There is no evidence of pericardial effusion. Mitral  Valve: The mitral valve is normal in structure. Trivial mitral valve regurgitation. No evidence of mitral valve stenosis. Tricuspid Valve: The tricuspid valve is normal in structure. Tricuspid valve regurgitation is trivial. No evidence of tricuspid stenosis. Aortic Valve: The aortic valve is tricuspid. Aortic valve regurgitation is not visualized. No aortic stenosis is present. Pulmonic Valve: The pulmonic valve was not well visualized. Pulmonic valve regurgitation is trivial. No evidence of pulmonic stenosis. Aorta: The aortic root is normal in size and structure. Venous: The inferior vena cava is normal in size with greater than 50% respiratory variability, suggesting right atrial pressure of 3 mmHg. IAS/Shunts: No atrial level shunt detected by color flow Doppler.  LEFT VENTRICLE PLAX 2D LVIDd:         3.70 cm  Diastology LVIDs:         2.60 cm  LV e' medial:    5.55 cm/s LV PW:          0.90 cm  LV E/e' medial:  11.2 LV IVS:        1.00 cm  LV e' lateral:   10.60 cm/s LVOT diam:     1.80 cm  LV E/e' lateral: 5.9 LV SV:         41 LV SV Index:   26 LVOT Area:     2.54 cm  RIGHT VENTRICLE RV Basal diam:  2.50 cm RV S prime:     10.70 cm/s TAPSE (M-mode): 2.0 cm LEFT ATRIUM             Index       RIGHT ATRIUM          Index LA diam:        2.60 cm 1.68 cm/m  RA Area:     9.97 cm LA Vol (A2C):   36.1 ml 23.30 ml/m RA Volume:   23.00 ml 14.85 ml/m LA Vol (A4C):   21.8 ml 14.07 ml/m LA Biplane Vol: 29.6 ml 19.11 ml/m  AORTIC VALVE LVOT Vmax:   85.00 cm/s LVOT Vmean:  52.100 cm/s LVOT VTI:    0.160 m  AORTA Ao Root diam: 2.60 cm MITRAL VALVE               TRICUSPID VALVE MV Area (PHT): 3.12 cm    TR Peak grad:   18.3 mmHg MV Decel Time: 243 msec    TR Vmax:        214.00 cm/s MV E velocity: 62.20 cm/s MV A velocity: 72.10 cm/s  SHUNTS MV E/A ratio:  0.86        Systemic VTI:  0.16 m                            Systemic Diam: 1.80 cm Kirk Ruths MD Electronically signed by Kirk Ruths MD Signature Date/Time: 06/24/2020/2:41:59 PM    Final    CT HEAD CODE STROKE WO CONTRAST  Result Date: 06/24/2020 CLINICAL DATA:  Code stroke. Acute neuro deficit. Left-sided weakness. EXAM: CT HEAD WITHOUT CONTRAST TECHNIQUE: Contiguous axial images were obtained from the base of the skull through the vertex without intravenous contrast. COMPARISON:  CT head 06/30/2019 FINDINGS: Brain: Mild atrophy. Mild white matter changes bilaterally. Small chronic infarct right occipital lobe unchanged from prior studies. Negative for acute infarct, hemorrhage, mass. Vascular: Negative for hyperdense vessel Skull: Negative Sinuses/Orbits: Paranasal sinuses clear.  Negative orbit Other: None ASPECTS ([place] Stroke Program Early CT Score) - Ganglionic level infarction (  caudate, lentiform nuclei, internal capsule, insula, M1-M3 cortex): 7 - Supraganglionic infarction (M4-M6 cortex): 3 Total score (0-10 with 10  being normal): 10 IMPRESSION: 1. No acute abnormality 2. Atrophy and chronic ischemic change.  No intracranial hemorrhage. 3. ASPECTS is 10 4. Code stroke imaging results were communicated on 06/24/2020 at 11:17 am to provider Lorrin Goodell via text page Electronically Signed   By: Franchot Gallo M.D.   On: 06/24/2020 11:18   VAS US CAROTID (at [facility] and [facility] only)  Result Date: 06/24/2020 Carotid Arterial Duplex Study Patient Name:  [facility] [place]  Date of Exam:   06/24/2020 Medical Rec #: [id number]           Accession #:    [id number] Date of Birth: 1955-03-19           Patient Gender: F Patient Age:   65Y Exam Location:  [facility] Procedure:      VAS US CAROTID Referring Phys: [id number] Orma Flaming --------------------------------------------------------------------------------  Indications:       CVA and Left sided weakness and facial droop. Risk Factors:      Hypertension, no history of smoking, prior CVA. Other Factors:     Vascular dementia. Comparison Study:  CTA from 06/2919 WNL Performing Technologist: Rogelia Rohrer RVT, RDMS  Examination Guidelines: A complete evaluation includes B-mode imaging, spectral Doppler, color Doppler, and power Doppler as needed of all accessible portions of each vessel. Bilateral testing is considered an integral part of a complete examination. Limited examinations for reoccurring indications may be performed as noted.  Right Carotid Findings: +----------+--------+--------+--------+------------------+------------------+           PSV cm/sEDV cm/sStenosisPlaque DescriptionComments           +----------+--------+--------+--------+------------------+------------------+ CCA Prox  67      14                                intimal thickening +----------+--------+--------+--------+------------------+------------------+ CCA Distal76      16                                intimal thickening  +----------+--------+--------+--------+------------------+------------------+ ICA Prox  59      20                                                   +----------+--------+--------+--------+------------------+------------------+ ICA Distal70      27                                                   +----------+--------+--------+--------+------------------+------------------+ ECA       70      13                                                   +----------+--------+--------+--------+------------------+------------------+ +----------+--------+-------+----------------+-------------------+           PSV cm/sEDV cmsDescribe        Arm Pressure (mmHG) +----------+--------+-------+----------------+-------------------+ [id number]  Multiphasic, WNL                    +----------+--------+-------+----------------+-------------------+ +---------+--------+--+--------+--+---------+ VertebralPSV cm/s38EDV cm/s11Antegrade +---------+--------+--+--------+--+---------+  Left Carotid Findings: +----------+--------+--------+--------+------------------+------------------+           PSV cm/sEDV cm/sStenosisPlaque DescriptionComments           +----------+--------+--------+--------+------------------+------------------+ CCA Prox  73      13                                intimal thickening +----------+--------+--------+--------+------------------+------------------+ CCA Distal85      22                                intimal thickening +----------+--------+--------+--------+------------------+------------------+ ICA Prox  59      22                                                   +----------+--------+--------+--------+------------------+------------------+ ICA Distal63      23                                                   +----------+--------+--------+--------+------------------+------------------+ ECA       81      11                                                    +----------+--------+--------+--------+------------------+------------------+ +----------+--------+--------+----------------+-------------------+           PSV cm/sEDV cm/sDescribe        Arm Pressure (mmHG) +----------+--------+--------+----------------+-------------------+ [id number]             Multiphasic, WNL                    +----------+--------+--------+----------------+-------------------+ +---------+--------+--+--------+--+---------+ VertebralPSV cm/s46EDV cm/s11Antegrade +---------+--------+--+--------+--+---------+   Summary: Right Carotid: Velocities in the right ICA are consistent with a 1-39% stenosis. Left Carotid: Velocities in the left ICA are consistent with a 1-39% stenosis. Vertebrals:  Bilateral vertebral arteries demonstrate antegrade flow. Subclavians: Normal flow hemodynamics were seen in bilateral subclavian              arteries. *See table(s) above for measurements and observations.     Preliminary         Scheduled Meds:  vitamin C  250 mg Oral Daily   aspirin  81 mg Oral Daily   atorvastatin  40 mg Oral Daily   cholecalciferol  1,000 Units Oral Daily   clopidogrel  75 mg Oral Daily   enoxaparin (LOVENOX) injection  40 mg Subcutaneous Q24H   losartan  25 mg Oral Q1500   multivitamin with minerals  1 tablet Oral Daily   thiamine  50 mg Oral Daily   Continuous Infusions:   LOS: 1 day    Time spent: [place]    Nicolette Bang, MD [facility]  If 7PM-7AM, please contact night-coverage  06/25/2020, 2:06 PM

## 2020-06-25 NOTE — Progress Notes (Signed)
Inpatient Rehab Admissions Coordinator Note:   Per therapy recommendations, pt was screened for CIR candidacy by Clemens Catholic, [place] CCC-SLP. At this time, Pt. Appears to have functional decline and is a potential  candidate for CIR. Will place order for rehab consult per protocol. Note that Pt. Is currently observation status, but appears likely to be admitted as inpatient. Technically, Pt must be inpatient to be considered for CIR, so I will hold off on beginning her work up until status changes   Please contact me with questions.   Clemens Catholic, [place], [place] Admissions Coordinator  [phone] ([place]) [phone] (office)

## 2020-06-25 NOTE — Evaluation (Signed)
Occupational Therapy Evaluation Patient Details Name: Jacqueline Bonilla MRN: [id number] DOB: Oct 23, 1955 Today's Date: 06/25/2020    History of Present Illness 65 y.o. female with medical history significant of past CVA, vascular dementia, HTN, thrombocytopenia and ILR who presented for left sided weakness and facial droop.  MRI: acute infarct in the high left frontal cortex.  Multiple remote cortical infarcts in bilateral frontal, parietal,  and occipital lobes and multiple bilateral cerebellar lacunar  infarcts.  Moderate atrophy.   Clinical Impression   Patient admitted for the diagnosis above.  Despite her NIH of 0, she is presenting with significant deficits related to mobility/toileting and self care completion.  PTA she lives at home with her spouse, who can provide 24 hour assist as needed.  She walked without an assistive device and completed her on bathing and dressing.  Due to cognitive deficits, her spouse handled community mobility and IADL.  Barriers are listed below.  Currently she is needing up to Mod A of 2 for basic mobility and ADL completion.  CIR is recommended for aggressive rehab.  OT will follow in the acute setting to maximize functional status.      Follow Up Recommendations  CIR    Equipment Recommendations  3 in 1 bedside commode;Tub/shower seat    Recommendations for Other Services Rehab consult     Precautions / Restrictions Precautions Precautions: Fall Restrictions Weight Bearing Restrictions: No      Mobility Bed Mobility Overal bed mobility: Needs Assistance Bed Mobility: Supine to Sit     Supine to sit: Min assist     General bed mobility comments: assist to bring hips to edgo of the bed Patient Response: Cooperative  Transfers Overall transfer level: Needs assistance   Transfers: Sit to/from Stand Sit to Stand: Mod assist         General transfer comment: leans back and to the R    Balance Overall balance assessment: Needs  assistance Sitting-balance support: Feet supported Sitting balance-Leahy Scale: Poor   Postural control: Posterior lean Standing balance support: Bilateral upper extremity supported;Single extremity supported Standing balance-Leahy Scale: Poor Standing balance comment: needs external support                           ADL either performed or assessed with clinical judgement   ADL Overall ADL's : Needs assistance/impaired Eating/Feeding: Set up;Sitting   Grooming: Wash/dry hands;Supervision/safety;Sitting           Upper Body Dressing : Minimal assistance;Sitting   Lower Body Dressing: Minimal assistance;Sitting/lateral leans;Moderate assistance   Toilet Transfer: Moderate assistance;Ambulation;+2 for physical assistance;+2 for safety/equipment   Toileting- Clothing Manipulation and Hygiene: Min guard;Sitting/lateral lean       Functional mobility during ADLs: Moderate assistance;+2 for physical assistance;+2 for safety/equipment       Vision Baseline Vision/History: Wears glasses Wears Glasses: At all times Patient Visual Report: No change from baseline Vision Assessment?: Vision impaired- to be further tested in functional context     Perception     Praxis      Pertinent Vitals/Pain Pain Assessment: No/denies pain     Hand Dominance Right   Extremity/Trunk Assessment Upper Extremity Assessment Upper Extremity Assessment: Overall WFL for tasks assessed   Lower Extremity Assessment Lower Extremity Assessment: Defer to PT evaluation   Cervical / Trunk Assessment Cervical / Trunk Assessment: Normal   Communication Communication Communication: No difficulties   Cognition Arousal/Alertness: Awake/alert Behavior During Therapy: WFL for tasks assessed/performed Overall  Cognitive Status: History of cognitive impairments - at baseline                                 General Comments: difficulty with memory, processing and complex  though.  does follow commands with increased time.   General Comments       Exercises     Shoulder Instructions      Home Living Family/patient expects to be discharged to:: Private residence Living Arrangements: Spouse/significant other Available Help at Discharge: Family;Available 24 hours/day Type of Home: House Home Access: Stairs to enter [employer] of Steps: 4 Entrance Stairs-Rails: None Home Layout: One level     Bathroom Shower/Tub: Occupational psychologist: Standard     Home Equipment: Cane - single point          Prior Functioning/Environment Level of Independence: Independent with assistive device(s)        Comments: Spouse completes all meal prep, community mobility, IADL, bill payment and med management.  She walked without an AD, and was able to perfrom her own self care.        OT Problem List: Decreased strength;Decreased activity tolerance;Impaired balance (sitting and/or standing);Impaired vision/perception;Decreased cognition      OT Treatment/Interventions: Self-care/ADL training;Therapeutic exercise;DME and/or AE instruction;Balance training;Visual/perceptual remediation/compensation;Therapeutic activities    OT Goals(Current goals can be found in the care plan section) Acute Rehab OT Goals Patient Stated Goal: I need to move better OT Goal Formulation: With patient Time For Goal Achievement: 07/09/20 Potential to Achieve Goals: Good ADL Goals Pt Will Perform Grooming: with set-up;sitting Pt Will Perform Upper Body Bathing: with supervision;sitting Pt Will Perform Lower Body Bathing: with min guard assist;sit to/from stand Pt Will Perform Upper Body Dressing: with supervision;sitting Pt Will Perform Lower Body Dressing: with min guard assist;sit to/from stand Pt Will Transfer to Toilet: with min guard assist;ambulating Pt Will Perform Toileting - Clothing Manipulation and hygiene: with supervision;sitting/lateral  leans Pt/caregiver will Perform Home Exercise Program: With theraband;With Supervision;With written HEP provided;Increased strength  OT Frequency: Min 2X/week   Barriers to D/C:    none noted       Co-evaluation              AM-PAC OT "6 Clicks" Daily Activity     Outcome Measure Help from another person eating meals?: None Help from another person taking care of personal grooming?: A Little Help from another person toileting, which includes using toliet, bedpan, or urinal?: A Lot Help from another person bathing (including washing, rinsing, drying)?: A Lot Help from another person to put on and taking off regular upper body clothing?: A Little Help from another person to put on and taking off regular lower body clothing?: A Lot 6 Click Score: 16   End of Session Equipment Utilized During Treatment: Gait belt;Rolling walker Nurse Communication: Mobility status  Activity Tolerance: Patient tolerated treatment well Patient left: in chair;with call bell/phone within reach;with chair alarm set  OT Visit Diagnosis: Unsteadiness on feet (R26.81);Other abnormalities of gait and mobility (R26.89);Muscle weakness (generalized) (M62.81);Ataxia, unspecified (R27.0);Other symptoms and signs involving cognitive function                Time: 6270-3500 OT Time Calculation (min): 28 min Charges:  OT General Charges $OT Visit: 1 Visit OT Evaluation $OT Eval Moderate Complexity: 1 Mod OT Treatments $Self Care/Home Management : 8-22 mins  06/25/2020  Rich, OTR/L  Acute Rehabilitation  Services  Office:  [place] 06/25/2020, 9:06 AM

## 2020-06-25 NOTE — Progress Notes (Addendum)
STROKE TEAM PROGRESS NOTE   INTERVAL HISTORY No one at bedside during today's visit. Pt is still having mainly left leg weakness. Her stroke risk factors and prev strokes were reviewed. She is highly motivated to work with rehab and get better. Baseline mRS is actually 1 as she does still have mild symptoms in right hand from prev stroke, but doesn't stop her from doing all ADLs.   Vitals:   06/24/20 1834 06/24/20 2023 06/25/20 0020 06/25/20 0431  BP:  (!) 164/90 (!) 143/92 (!) 162/94  Pulse: 68 84 76 85  Resp: 16 18 18 18   Temp:  98.1 F (36.7 C) 97.9 F (36.6 C) 98.3 F (36.8 C)  TempSrc:  Oral Oral Oral  SpO2: 100% 99% 100% 100%  Weight:      Height:       CBC:  Recent Labs  Lab 06/24/20 1100 06/24/20 1106  WBC 6.3  --   NEUTROABS 4.5  --   HGB 13.1 14.3  HCT 40.4 42.0  MCV 92.2  --   PLT 214  --    Basic Metabolic Panel:  Recent Labs  Lab 06/24/20 1100 06/24/20 1106  NA 138 139  K 4.2 4.1  CL 103 102  CO2 28  --   GLUCOSE 97 95  BUN 17 19  CREATININE 1.05* 1.10*  CALCIUM 9.4  --    Lipid Panel:  Recent Labs  Lab 06/25/20 0447  CHOL 164  TRIG 56  HDL 45  CHOLHDL 3.6  VLDL 11  LDLCALC 108*   HgbA1c:  Recent Labs  Lab 06/25/20 0447  HGBA1C 5.8*   Urine Drug Screen: No results for input(s): LABOPIA, COCAINSCRNUR, LABBENZ, AMPHETMU, THCU, LABBARB in the last 168 hours.  Alcohol Level No results for input(s): ETH in the last 168 hours.  IMAGING past 24 hours MR ANGIO HEAD WO CONTRAST  Result Date: 06/24/2020 CLINICAL DATA:  Neuro deficit, acute stroke suspected EXAM: MRI HEAD WITHOUT CONTRAST MRA HEAD WITHOUT CONTRAST TECHNIQUE: Multiplanar, multi-echo pulse sequences of the brain and surrounding structures were acquired without intravenous contrast. Angiographic images of the Circle of Willis were acquired using MRA technique without intravenous contrast. COMPARISON:  And a CT code stroke. MRI head June 23, 2019. CTA June 30 2019. FINDINGS: MRI HEAD  FINDINGS Brain: Punctate focus of restricted diffusion in the high left frontal cortex (series 3, image 38). No significant edema or mass effect. Additional punctate areas of faint DWI signal in bilateral precentral gyri (series 3, images 37 and 39). Redemonstrated multiple remote cortical infarcts in bilateral frontal parietal and occipital lobes. Multiple bilateral cerebellar lacunar infarcts. Moderate scattered patchy confluent T2/FLAIR hyperintensities within the white matter most likely related to chronic microvascular ischemic disease. No hydrocephalus. Age advanced moderate cerebral atrophy with ex vacuo ventricular dilation. No acute hemorrhage. No mass lesion or abnormal mass effect. No extra-axial fluid collection. Vascular: See below. Skull and upper cervical spine: Normal marrow signal. Sinuses/Orbits: Clear sinuses.  Unremarkable orbits. Other: No sizable mastoid effusions. MRA HEAD FINDINGS Anterior circulation: Bilateral ICAs, MCAs, and ACAs are patent without flow-limiting proximal stenosis. No aneurysm identified. Posterior circulation: The visualized intradural vertebral arteries, basilar artery and posterior cerebral arteries are patent without flow limiting proximal stenosis. No aneurysm identified. IMPRESSION: MRI: 1. Punctate acute infarct in the high left frontal cortex. Additional faint punctate areas of DWI signal in bilateral precentral gyri are suspicious for additional acute or subacute infarcts, but could potentially represent artifact. Given potential involvement of multiple vascular  territories, consider a central embolic etiology. 2. 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 3. Moderate atrophy. MRA: No large vessel occlusion or proximal flow limiting stenosis. Electronically Signed   By: Margaretha Sheffield MD   On: 06/24/2020 20:15   MR BRAIN WO CONTRAST  Result Date:  06/24/2020 CLINICAL DATA:  Neuro deficit, acute stroke suspected EXAM: MRI HEAD WITHOUT CONTRAST MRA HEAD WITHOUT CONTRAST TECHNIQUE: Multiplanar, multi-echo pulse sequences of the brain and surrounding structures were acquired without intravenous contrast. Angiographic images of the Circle of Willis were acquired using MRA technique without intravenous contrast. COMPARISON:  And a CT code stroke. MRI head June 23, 2019. CTA June 30 2019. FINDINGS: MRI HEAD FINDINGS Brain: Punctate focus of restricted diffusion in the high left frontal cortex (series 3, image 38). No significant edema or mass effect. Additional punctate areas of faint DWI signal in bilateral precentral gyri (series 3, images 37 and 39). Redemonstrated multiple remote cortical infarcts in bilateral frontal parietal and occipital lobes. Multiple bilateral cerebellar lacunar infarcts. Moderate scattered patchy confluent T2/FLAIR hyperintensities within the white matter most likely related to chronic microvascular ischemic disease. No hydrocephalus. Age advanced moderate cerebral atrophy with ex vacuo ventricular dilation. No acute hemorrhage. No mass lesion or abnormal mass effect. No extra-axial fluid collection. Vascular: See below. Skull and upper cervical spine: Normal marrow signal. Sinuses/Orbits: Clear sinuses.  Unremarkable orbits. Other: No sizable mastoid effusions. MRA HEAD FINDINGS Anterior circulation: Bilateral ICAs, MCAs, and ACAs are patent without flow-limiting proximal stenosis. No aneurysm identified. Posterior circulation: The visualized intradural vertebral arteries, basilar artery and posterior cerebral arteries are patent without flow limiting proximal stenosis. No aneurysm identified. IMPRESSION: MRI: 1. Punctate acute infarct in the high left frontal cortex. Additional faint punctate areas of DWI signal in bilateral precentral gyri are suspicious for additional acute or subacute infarcts, but could potentially represent  artifact. Given potential involvement of multiple vascular territories, consider a central embolic etiology. 2. 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 3. Moderate atrophy. MRA: No large vessel occlusion or proximal flow limiting stenosis. Electronically Signed   By: Margaretha Sheffield MD   On: 06/24/2020 20:15   ECHOCARDIOGRAM COMPLETE  Result Date: 06/24/2020    ECHOCARDIOGRAM REPORT   Patient Name:   Jacqueline Bonilla Date of Exam: 06/24/2020 Medical Rec #:  [id number]          Height:       64.0 in Accession #:    [id number]         Weight:       115.5 lb Date of Birth:  [birth year]          BSA:          1.549 m Patient Age:    65 years           BP:           162/86 mmHg Patient Gender: F                  HR:           69 bpm. Exam Location:  Inpatient Procedure: 2D Echo, Cardiac Doppler and Color Doppler Indications:    CVA  History:        Patient has prior history of Echocardiogram examinations.                 Stroke, Signs/Symptoms:Dementia;  Risk Factors:Hypertension.  Sonographer:    Dustin Flock Referring Phys: [id number] Orma Flaming  Sonographer Comments: Image acquisition challenging due to respiratory motion. IMPRESSIONS  1. Left ventricular ejection fraction, by estimation, is 55 to 60%. The left ventricle has normal function. The left ventricle has no regional wall motion abnormalities. Left ventricular diastolic parameters are consistent with Grade I diastolic dysfunction (impaired relaxation).  2. Right ventricular systolic function is normal. The right ventricular size is normal. There is normal pulmonary artery systolic pressure.  3. The mitral valve is normal in structure. Trivial mitral valve regurgitation. No evidence of mitral stenosis.  4. The aortic valve is tricuspid. Aortic valve regurgitation is not visualized. No aortic stenosis is present.  5. The inferior vena cava is normal in  size with greater than 50% respiratory variability, suggesting right atrial pressure of 3 mmHg. FINDINGS  Left Ventricle: Left ventricular ejection fraction, by estimation, is 55 to 60%. The left ventricle has normal function. The left ventricle has no regional wall motion abnormalities. The left ventricular internal cavity size was normal in size. There is  no left ventricular hypertrophy. Left ventricular diastolic parameters are consistent with Grade I diastolic dysfunction (impaired relaxation). Right Ventricle: The right ventricular size is normal. Right ventricular systolic function is normal. There is normal pulmonary artery systolic pressure. The tricuspid regurgitant velocity is 2.14 m/s, and with an assumed right atrial pressure of 3 mmHg,  the estimated right ventricular systolic pressure is 70.9 mmHg. Left Atrium: Left atrial size was normal in size. Right Atrium: Right atrial size was normal in size. Pericardium: There is no evidence of pericardial effusion. Mitral Valve: The mitral valve is normal in structure. Trivial mitral valve regurgitation. No evidence of mitral valve stenosis. Tricuspid Valve: The tricuspid valve is normal in structure. Tricuspid valve regurgitation is trivial. No evidence of tricuspid stenosis. Aortic Valve: The aortic valve is tricuspid. Aortic valve regurgitation is not visualized. No aortic stenosis is present. Pulmonic Valve: The pulmonic valve was not well visualized. Pulmonic valve regurgitation is trivial. No evidence of pulmonic stenosis. Aorta: The aortic root is normal in size and structure. Venous: The inferior vena cava is normal in size with greater than 50% respiratory variability, suggesting right atrial pressure of 3 mmHg. IAS/Shunts: No atrial level shunt detected by color flow Doppler.  LEFT VENTRICLE PLAX 2D LVIDd:         3.70 cm  Diastology LVIDs:         2.60 cm  LV e' medial:    5.55 cm/s LV PW:         0.90 cm  LV E/e' medial:  11.2 LV IVS:        1.00  cm  LV e' lateral:   10.60 cm/s LVOT diam:     1.80 cm  LV E/e' lateral: 5.9 LV SV:         41 LV SV Index:   26 LVOT Area:     2.54 cm  RIGHT VENTRICLE RV Basal diam:  2.50 cm RV S prime:     10.70 cm/s TAPSE (M-mode): 2.0 cm LEFT ATRIUM             Index       RIGHT ATRIUM          Index LA diam:        2.60 cm 1.68 cm/m  RA Area:     9.97 cm LA Vol (A2C):   36.1 ml 23.30 ml/m RA Volume:  23.00 ml 14.85 ml/m LA Vol (A4C):   21.8 ml 14.07 ml/m LA Biplane Vol: 29.6 ml 19.11 ml/m  AORTIC VALVE LVOT Vmax:   85.00 cm/s LVOT Vmean:  52.100 cm/s LVOT VTI:    0.160 m  AORTA Ao Root diam: 2.60 cm MITRAL VALVE               TRICUSPID VALVE MV Area (PHT): 3.12 cm    TR Peak grad:   18.3 mmHg MV Decel Time: 243 msec    TR Vmax:        214.00 cm/s MV E velocity: 62.20 cm/s MV A velocity: 72.10 cm/s  SHUNTS MV E/A ratio:  0.86        Systemic VTI:  0.16 m                            Systemic Diam: 1.80 cm Kirk Ruths MD Electronically signed by Kirk Ruths MD Signature Date/Time: 06/24/2020/2:41:59 PM    Final    CT HEAD CODE STROKE WO CONTRAST  Result Date: 06/24/2020 CLINICAL DATA:  Code stroke. Acute neuro deficit. Left-sided weakness. EXAM: CT HEAD WITHOUT CONTRAST TECHNIQUE: Contiguous axial images were obtained from the base of the skull through the vertex without intravenous contrast. COMPARISON:  CT head 06/30/2019 FINDINGS: Brain: Mild atrophy. Mild white matter changes bilaterally. Small chronic infarct right occipital lobe unchanged from prior studies. Negative for acute infarct, hemorrhage, mass. Vascular: Negative for hyperdense vessel Skull: Negative Sinuses/Orbits: Paranasal sinuses clear.  Negative orbit Other: None ASPECTS ([place] Stroke Program Early CT Score) - Ganglionic level infarction (caudate, lentiform nuclei, internal capsule, insula, M1-M3 cortex): 7 - Supraganglionic infarction (M4-M6 cortex): 3 Total score (0-10 with 10 being normal): 10 IMPRESSION: 1. No acute abnormality 2.  Atrophy and chronic ischemic change.  No intracranial hemorrhage. 3. ASPECTS is 10 4. Code stroke imaging results were communicated on 06/24/2020 at 11:17 am to provider Lorrin Goodell via text page Electronically Signed   By: Franchot Gallo M.D.   On: 06/24/2020 11:18   VAS US CAROTID (at [facility] and [facility] only)  Result Date: 06/24/2020 Carotid Arterial Duplex Study Patient Name:  Jacqueline Bonilla  Date of Exam:   06/24/2020 Medical Rec #: [id number]           Accession #:    [id number] Date of Birth: 05/30/1955           Patient Gender: F Patient Age:   65Y Exam Location:  [facility] Procedure:      VAS US CAROTID Referring Phys: [id number] Orma Flaming --------------------------------------------------------------------------------  Indications:       CVA and Left sided weakness and facial droop. Risk Factors:      Hypertension, no history of smoking, prior CVA. Other Factors:     Vascular dementia. Comparison Study:  CTA from 06/2919 WNL Performing Technologist: Rogelia Rohrer RVT, RDMS  Examination Guidelines: A complete evaluation includes B-mode imaging, spectral Doppler, color Doppler, and power Doppler as needed of all accessible portions of each vessel. Bilateral testing is considered an integral part of a complete examination. Limited examinations for reoccurring indications may be performed as noted.  Right Carotid Findings: +----------+--------+--------+--------+------------------+------------------+           PSV cm/sEDV cm/sStenosisPlaque DescriptionComments           +----------+--------+--------+--------+------------------+------------------+ CCA Prox  67      14  intimal thickening +----------+--------+--------+--------+------------------+------------------+ CCA Distal76      16                                intimal thickening +----------+--------+--------+--------+------------------+------------------+ ICA Prox  59      20                                                    +----------+--------+--------+--------+------------------+------------------+ ICA Distal70      27                                                   +----------+--------+--------+--------+------------------+------------------+ ECA       70      13                                                   +----------+--------+--------+--------+------------------+------------------+ +----------+--------+-------+----------------+-------------------+           PSV cm/sEDV cmsDescribe        Arm Pressure (mmHG) +----------+--------+-------+----------------+-------------------+ [id number]             Multiphasic, WNL                    +----------+--------+-------+----------------+-------------------+ +---------+--------+--+--------+--+---------+ VertebralPSV cm/s38EDV cm/s11Antegrade +---------+--------+--+--------+--+---------+  Left Carotid Findings: +----------+--------+--------+--------+------------------+------------------+           PSV cm/sEDV cm/sStenosisPlaque DescriptionComments           +----------+--------+--------+--------+------------------+------------------+ CCA Prox  73      13                                intimal thickening +----------+--------+--------+--------+------------------+------------------+ CCA Distal85      22                                intimal thickening +----------+--------+--------+--------+------------------+------------------+ ICA Prox  59      22                                                   +----------+--------+--------+--------+------------------+------------------+ ICA Distal63      23                                                   +----------+--------+--------+--------+------------------+------------------+ ECA       81      11                                                   +----------+--------+--------+--------+------------------+------------------+  +----------+--------+--------+----------------+-------------------+  PSV cm/sEDV cm/sDescribe        Arm Pressure (mmHG) +----------+--------+--------+----------------+-------------------+ [id number]             Multiphasic, WNL                    +----------+--------+--------+----------------+-------------------+ +---------+--------+--+--------+--+---------+ VertebralPSV cm/s46EDV cm/s11Antegrade +---------+--------+--+--------+--+---------+   Summary: Right Carotid: Velocities in the right ICA are consistent with a 1-39% stenosis. Left Carotid: Velocities in the left ICA are consistent with a 1-39% stenosis. Vertebrals:  Bilateral vertebral arteries demonstrate antegrade flow. Subclavians: Normal flow hemodynamics were seen in bilateral subclavian              arteries. *See table(s) above for measurements and observations.     Preliminary     PHYSICAL EXAM General: Appears well-developed. Psych: Affect appropriate to situation Eyes: No scleral injection HENT: No OP obstrucion Head: Normocephalic.  Cardiovascular: Normal rate and regular rhythm.  Respiratory: Effort normal and breath sounds normal to anterior ascultation GI: Soft.  No distension. There is no tenderness.  Skin: WDI    Neurological Examination Mental Status: Alert, oriented, thought content appropriate.  Speech fluent without evidence of aphasia. Able to follow 3 step commands without difficulty. Cranial Nerves: II: Visual fields grossly normal,  III,IV, VI: ptosis not present, extra-ocular motions intact bilaterally, pupils equal, round, reactive to light and accommodation V,VII: faint left naso-labial flattening, but smile is symmetric, facial light touch sensation normal bilaterally VIII: hearing normal bilaterally IX,X: uvula rises symmetrically XI: bilateral shoulder shrug XII: midline tongue extension Motor: Right : Upper extremity   5/5    Left:     Upper extremity   5/5, very slight  pronator drift noted  Lower extremity   5/5     Lower extremity   4+/5, mild drift Tone and bulk:normal tone throughout; no atrophy noted Sensory:  light touch intact throughout, bilaterally with the exception of right hand/fingers d/t old stroke residual per pt Deep Tendon Reflexes: 2+ and symmetric throughout Plantars: Right: downgoing   Left: downgoing Cerebellar: no gross ataxia Gait: normal gait and station   ASSESSMENT/PLAN [name] is a 65 y.o. female with history of HTN, Osteoporosis, thrombocytopenia, microhematuria who fell d/t sudden onset left sided weakness while working out at the gym on treadmill. tPA was offered, but she decline. No LVO  Stroke: Bilateral small cortical infarcts; etiology appears embolic. No report of Afib thus far. Pt has loop recorder, will ask to have this interrogated.  Code Stroke CT head No acute abnormality. Small vessel disease. Atrophy. ASPECTS 10.    MRI  Bilateral scattered infarcts, most acute looking in left cortex (but this one would not relate to her presentation MRA  neg Carotid Doppler  no stenosis 2D Echo pending LDL 108 HgbA1c 5.8 VTE prophylaxis - lovenox    Diet   Diet Heart Room service appropriate? Yes; Fluid consistency: Thin   aspirin 81 mg daily prior to admission, now on aspirin 81 mg daily and clopidogrel 75 mg daily.  Therapy recommendations:  pending Disposition:  pending  Hypertension Home meds:  cozaar Stable Permissive hypertension (OK if < 220/120) but gradually normalize in 5-7 days Long-term BP goal normotensive  Hyperlipidemia Home meds:  none,  LDL 108, goal < 70 Add Lipitor 40mg   High intensity statin  Continue statin at discharge  Diabetes type II no dx HgbA1c 5.8, goal < 7.0 CBGs Recent Labs    06/24/20 1103  GLUCAP 80    SSI  Other Stroke  Risk Factors Advanced Age >/= 36  Hx stroke/TIA  Pt previously had a loop placed- will call to get this interrogated. Previous monthly  reports did not show Afib per pt who gets her monthly reports.   Hospital day # 1  Desiree Metzger-Cihelka, ARNP-C, ANVP-BC Pager: [phone]   ATTENDING NOTE: I reviewed above note and agree with the assessment and plan. Pt was seen and examined.   65 year old female with history of from a previous stroke, thrombocytopenia, hypertension, anemia, loop recorder placement admitted for left-sided weakness, left facial droop and fell.  Patient symptoms initially waxed and waned and then improved.  CT no acute abnormality.  Patient declined tPA.  Carotid Doppler negative.  EF 55 to 60%.  MRI showed left frontal cortex punctate infarct as well as right ACA 2 punctate infarcta.  MRA negative.  A1c 5.8, LDL 108.  Creatinine 1.10.  On exam, patient sitting in chair, stated she still feel left leg weakness but left arm no weakness.  Patient awake alert, no aphasia, follows simple commands, able to name and repeat.  Visual fields are full, no gaze palsy.  Slight left nasolabial fold flattening but facial movement symmetrical.  Tongue midline.  Bilateral upper extremity no drift, right lower extremity 5/5, left lower extremity proximal 4+/5, knee flexion 3/5, ankle dorsiflexion 2+/5.  Sensation symmetrical, finger-to-nose intact bilaterally.  Gait not tested.  Patient stated that she still has some right hand numbness tingling which is a residual from previous stroke.  Currently loop recorder no A. fib, however need loop recorder interrogation at this time.  Continue aspirin 81 and Plavix 75 for 3 weeks and then Plavix alone.  Added Lipitor 40.  PT/OT recommended CIR.  We will follow.  For detailed assessment and plan, please refer to above as I have made changes wherever appropriate.   Rosalin Hawking, MD PhD Stroke Neurology 06/25/2020 4:30 PM    To contact Stroke Continuity provider, please refer to [email or web address] After hours, contact General Neurology

## 2020-06-25 NOTE — Evaluation (Signed)
Speech Language Pathology Evaluation Patient Details Name: Jacqueline Bonilla MRN: [id number] DOB: 03/13/1955 Today's Date: 06/25/2020 Time: 6761-9509 SLP Time Calculation (min) (ACUTE ONLY): 23 min  Problem List:  Patient Active Problem List   Diagnosis Date Noted   Acute left-sided weakness 06/24/2020   Vascular dementia with behavior disturbance ([place]) 10/13/2019   Vascular dementia without behavioral disturbance ([place]) 07/15/2019   Cerebrovascular accident ([place]) 06/25/2019   Brain fog 04/22/2019   History of COVID-19 04/22/2019   Essential hypertension 03/03/2019   Hypertensive urgency 03/03/2019   Past Medical History:  Past Medical History:  Diagnosis Date   Anemia    Anxiety    Depression    Dysmenorrhea    Endometriosis    Fibroid    Hypertension    Microhematuria    negative workup   Osteoporosis    Tachycardia    Thrombocytopenia [facility])    Past Surgical History:  Past Surgical History:  Procedure Laterality Date   BREAST BIOPSY     CESAREAN SECTION     hysteroscopic resection     implantable loop recorder implant  10/21/2019   Medtronic Reveal Ruby model LNQ 22 ([place] [id number]) implantable loop recorder   HPI:  Jacqueline Bonilla is a 65 y.o. female who presented for left sided weakness and facial droop. MRI 6/24: "1. Punctate acute infarct in the high left frontal cortex. Additional faint punctate areas of DWI signal in bilateral precentral gyri are suspicious for additional acute or subacute infarcts, but could potentially represent artifact. Given potential involvement of multiple vascular territories, consider a central embolic etiology.  2. 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  3. Moderate atrophy."  Pt has medical history significant of past CVA, vascular dementia, HTN, thrombocytopenia and ILR.   Assessment / Plan / Recommendation Clinical Impression  Pt  presents with mild cognitive deficits which appear to be consistent with baseline status.  Pt reports deficits from prior stroke and feels that she has returned to her normal.  Pt was assessed using the COGNISTAT (see below for additional information).  There was moderate impairment with word recall task.  Pt reports that she uses [employer] at home on iPad and feels that it has been extremely helpful for rehabing her deficits from prior stroke. She is able to name memory and organization strategies she employs and how they have helped her. Following completion of assessment, pt was able to recall 4 items with greater accuracy and facility than on trial during evaluation.  Pt exhibited mild deficits in calculations.  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  Pt demonstrated ability to self correct throughout evaluation.  She reports that her husband [name] helps her at home.  He completes memory activities with her with use of card deck.  She also mentioned that he has bought her a special adaptive writing utensil.  She says she feels frustrated that she still has difficulty writing after prior CVA and feels this d/t fine motor impairments.  She does not report any difficulty with letter or word formation, just the execution of writing.  OT may be able to to offer strategies.  As pt feels she is at her baseline and can independently articulate compensatory strateiges, pt does not appear to have any acute ST needs at this time.  SLP will sign off.  If there is any change in cognitive status, please reconsult speech therapy.  COGNISTAT: All subtests are within the average range, except where otherwise specified.  Orientation:  12/12 Attention: 8/8 Comprehension: 5/6 Repetition: 12/12 Naming: 8/8 Construction: not assessed Memory: 6/12, Moderate Impairment Calculations: 2/4, Mild Impairment Similarities: 8/8 Judgment: 5/6    SLP Assessment  SLP  Recommendation/Assessment: Patient does not need any further Speech Lanaguage Pathology Services SLP Visit Diagnosis: Cognitive communication deficit (R41.841)    Follow Up Recommendations  None    Frequency and Duration   N/A        SLP Evaluation Cognition  Overall Cognitive Status: History of cognitive impairments - at baseline Arousal/Alertness: Awake/alert Orientation Level: Oriented X4 Attention: Focused;Sustained Focused Attention: Appears intact Sustained Attention: Appears intact Memory: Impaired Memory Impairment: Retrieval deficit Problem Solving: Appears intact Executive Function: Reasoning Reasoning: Appears intact       Comprehension  Auditory Comprehension Overall Auditory Comprehension: Appears within functional limits for tasks assessed Commands: Within Functional Limits Conversation: Complex Visual Recognition/Discrimination Discrimination: Not tested Reading Comprehension Reading Status: Not tested    Expression Expression Primary Mode of Expression: Verbal Verbal Expression Overall Verbal Expression: Appears within functional limits for tasks assessed Initiation: No impairment Repetition: No impairment Naming: No impairment Pragmatics: No impairment Written Expression Dominant Hand: Right Written Expression: Not tested (reports difficulty writing 2/2 fine motor deficits (rather than letter/word formation difficulties), would likely benefit from OT)   Oral / Motor  Motor Speech Overall Motor Speech: Appears within functional limits for tasks assessed Respiration: Within functional limits Phonation: Normal Resonance: Within functional limits Articulation: Within functional limitis Intelligibility: Intelligible Motor Planning: Witnin functional limits Motor Speech Errors: Not applicable   [place], [place], [place] Office: [phone]; Pager (6/24): [phone] 06/25/2020, 3:56  PM

## 2020-06-26 DIAGNOSIS — I63423 Cerebral infarction due to embolism of bilateral anterior cerebral arteries: Secondary | ICD-10-CM

## 2020-06-26 NOTE — Progress Notes (Signed)
PROGRESS NOTE    Jacqueline Bonilla  DJM:[id number] DOB: Mar 25, 1955 DOA: 06/24/2020 PCP: Horald Pollen, MD   Brief Narrative:  Jacqueline Bonilla is a 65 y.o. female with medical history significant of past CVA, vascular dementia, HTN, thrombocytopenia and ILR who presented for left sided weakness and facial droop.  She was at gym exercising on the elliptical and when she went to get off she fell to the floor.  She had sudden left sided weakness and left facial droop. She believes this happened around 10AM. EMS was called. She denies any slurred speech or word finding difficulties and states she does not recall any left arm weakness, but this was reported by EMS.  She can not tell me if her symptoms have improved. She states she tried to stand up and go to the bathroom and was having to lean very heavily on aide so they put her back in bed. She denies any headaches, vision changes, chest pain, palpitations, fever, abdominal pain, N/V/D. She denies any loss of sensation. She is right handed.   Assessment & Plan:   Principal Problem:   Acute left-sided weakness Active Problems:   Essential hypertension   Cerebrovascular accident [facility])   Vascular dementia without behavioral disturbance ([place])   Cerebral infarction ([place])    Acute CVA with left-sided weakness Stroke work up initiated and neurology on board. She declined tpa in ER -MRI/MRA completed -echo completed, no acute findings -carotid dopplers 1-39 bilat -a1c 5.8 -lipid panel LDL above goal, c/w new statin -PT/OT/ST to eval-CIR -ASA 325mg  and plavix 300mg  given x 1 today. Will continue ASA 81mg  daily and plavix 75mg  daily x 3 weeks then asa 81 mg alone. -telemetry    Active Problems:   Essential hypertension -allow permissive HTN x 24 hours. Systolic <222. Will start home medication of losartaN TODAY     Cerebrovascular accident [facility]) Past hx of CVA. Per neurology recommendation continue ASA and plavix. Already has  neurologist outpatient.      Vascular dementia without behavioral disturbance (H[facility]) -appears to be at baseline. Continue f/u with outpatient neurologist.   Thrombocytopenia -wnl today, monitor. F/u outpatient as needed.   Elevated creatinine Light IVF x 24 hours or less if she passes swallow screen. Repeat labs in AM.    Body mass index is 19.83 kg/m.  DVT prophylaxis: Lovenox SQ  Code Status: full    Code Status Orders  (From admission, onward)           Start     Ordered   06/24/20 1327  Full code  Continuous        06/24/20 1331           Code Status History     This patient has a current code status but no historical code status.      Family Communication: Bedside with husband Disposition Plan: The CIR  Consults called:  Neurology Admission status: Inpatient Patient requires continued telemetry monitoring, intensive physical therapy, neurologic intervention, and likely short-term rehab for intensive physical therapy secondary to NEW stroke related deficits.  Currently patient's not safe for discharge and is unstable and high risk of fall and injury   Consultants:  As above  Procedures:  MR ANGIO HEAD WO CONTRAST  Result Date: 06/24/2020 CLINICAL DATA:  Neuro deficit, acute stroke suspected EXAM: MRI HEAD WITHOUT CONTRAST MRA HEAD WITHOUT CONTRAST TECHNIQUE: Multiplanar, multi-echo pulse sequences of the brain and surrounding structures were acquired without intravenous contrast. Angiographic images of the Circle  of Willis were acquired using MRA technique without intravenous contrast. COMPARISON:  And a CT code stroke. MRI head June 23, 2019. CTA June 30 2019. FINDINGS: MRI HEAD FINDINGS Brain: Punctate focus of restricted diffusion in the high left frontal cortex (series 3, image 38). No significant edema or mass effect. Additional punctate areas of faint DWI signal in bilateral precentral gyri (series 3, images 37 and 39). Redemonstrated multiple remote  cortical infarcts in bilateral frontal parietal and occipital lobes. Multiple bilateral cerebellar lacunar infarcts. Moderate scattered patchy confluent T2/FLAIR hyperintensities within the white matter most likely related to chronic microvascular ischemic disease. No hydrocephalus. Age advanced moderate cerebral atrophy with ex vacuo ventricular dilation. No acute hemorrhage. No mass lesion or abnormal mass effect. No extra-axial fluid collection. Vascular: See below. Skull and upper cervical spine: Normal marrow signal. Sinuses/Orbits: Clear sinuses.  Unremarkable orbits. Other: No sizable mastoid effusions. MRA HEAD FINDINGS Anterior circulation: Bilateral ICAs, MCAs, and ACAs are patent without flow-limiting proximal stenosis. No aneurysm identified. Posterior circulation: The visualized intradural vertebral arteries, basilar artery and posterior cerebral arteries are patent without flow limiting proximal stenosis. No aneurysm identified. IMPRESSION: MRI: 1. Punctate acute infarct in the high left frontal cortex. Additional faint punctate areas of DWI signal in bilateral precentral gyri are suspicious for additional acute or subacute infarcts, but could potentially represent artifact. Given potential involvement of multiple vascular territories, consider a central embolic etiology. 2. 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 3. Moderate atrophy. MRA: No large vessel occlusion or proximal flow limiting stenosis. Electronically Signed   By: Margaretha Sheffield MD   On: 06/24/2020 20:15   MR BRAIN WO CONTRAST  Result Date: 06/24/2020 CLINICAL DATA:  Neuro deficit, acute stroke suspected EXAM: MRI HEAD WITHOUT CONTRAST MRA HEAD WITHOUT CONTRAST TECHNIQUE: Multiplanar, multi-echo pulse sequences of the brain and surrounding structures were acquired without intravenous contrast. Angiographic images of the Circle of  Willis were acquired using MRA technique without intravenous contrast. COMPARISON:  And a CT code stroke. MRI head June 23, 2019. CTA June 30 2019. FINDINGS: MRI HEAD FINDINGS Brain: Punctate focus of restricted diffusion in the high left frontal cortex (series 3, image 38). No significant edema or mass effect. Additional punctate areas of faint DWI signal in bilateral precentral gyri (series 3, images 37 and 39). Redemonstrated multiple remote cortical infarcts in bilateral frontal parietal and occipital lobes. Multiple bilateral cerebellar lacunar infarcts. Moderate scattered patchy confluent T2/FLAIR hyperintensities within the white matter most likely related to chronic microvascular ischemic disease. No hydrocephalus. Age advanced moderate cerebral atrophy with ex vacuo ventricular dilation. No acute hemorrhage. No mass lesion or abnormal mass effect. No extra-axial fluid collection. Vascular: See below. Skull and upper cervical spine: Normal marrow signal. Sinuses/Orbits: Clear sinuses.  Unremarkable orbits. Other: No sizable mastoid effusions. MRA HEAD FINDINGS Anterior circulation: Bilateral ICAs, MCAs, and ACAs are patent without flow-limiting proximal stenosis. No aneurysm identified. Posterior circulation: The visualized intradural vertebral arteries, basilar artery and posterior cerebral arteries are patent without flow limiting proximal stenosis. No aneurysm identified. IMPRESSION: MRI: 1. Punctate acute infarct in the high left frontal cortex. Additional faint punctate areas of DWI signal in bilateral precentral gyri are suspicious for additional acute or subacute infarcts, but could potentially represent artifact. Given potential involvement of multiple vascular territories, consider a central embolic etiology. 2. 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 3. Moderate atrophy.  MRA: No large vessel occlusion  or proximal flow limiting stenosis. Electronically Signed   By: Margaretha Sheffield MD   On: 06/24/2020 20:15   ECHOCARDIOGRAM COMPLETE  Result Date: 06/24/2020    ECHOCARDIOGRAM REPORT   Patient Name:   Jacqueline Bonilla Date of Exam: 06/24/2020 Medical Rec #:  [id number]          Height:       64.0 in Accession #:    [id number]         Weight:       115.5 lb Date of Birth:  November 11, 1955          BSA:          1.549 m Patient Age:    44 years           BP:           162/86 mmHg Patient Gender: F                  HR:           69 bpm. Exam Location:  Inpatient Procedure: 2D Echo, Cardiac Doppler and Color Doppler Indications:    CVA  History:        Patient has prior history of Echocardiogram examinations.                 Stroke, Signs/Symptoms:Dementia; Risk Factors:Hypertension.  Sonographer:    Dustin Flock Referring Phys: [id number] Orma Flaming  Sonographer Comments: Image acquisition challenging due to respiratory motion. IMPRESSIONS  1. Left ventricular ejection fraction, by estimation, is 55 to 60%. The left ventricle has normal function. The left ventricle has no regional wall motion abnormalities. Left ventricular diastolic parameters are consistent with Grade I diastolic dysfunction (impaired relaxation).  2. Right ventricular systolic function is normal. The right ventricular size is normal. There is normal pulmonary artery systolic pressure.  3. The mitral valve is normal in structure. Trivial mitral valve regurgitation. No evidence of mitral stenosis.  4. The aortic valve is tricuspid. Aortic valve regurgitation is not visualized. No aortic stenosis is present.  5. The inferior vena cava is normal in size with greater than 50% respiratory variability, suggesting right atrial pressure of 3 mmHg. FINDINGS  Left Ventricle: Left ventricular ejection fraction, by estimation, is 55 to 60%. The left ventricle has normal function. The left ventricle has no regional wall motion abnormalities. The left  ventricular internal cavity size was normal in size. There is  no left ventricular hypertrophy. Left ventricular diastolic parameters are consistent with Grade I diastolic dysfunction (impaired relaxation). Right Ventricle: The right ventricular size is normal. Right ventricular systolic function is normal. There is normal pulmonary artery systolic pressure. The tricuspid regurgitant velocity is 2.14 m/s, and with an assumed right atrial pressure of 3 mmHg,  the estimated right ventricular systolic pressure is 42.7 mmHg. Left Atrium: Left atrial size was normal in size. Right Atrium: Right atrial size was normal in size. Pericardium: There is no evidence of pericardial effusion. Mitral Valve: The mitral valve is normal in structure. Trivial mitral valve regurgitation. No evidence of mitral valve stenosis. Tricuspid Valve: The tricuspid valve is normal in structure. Tricuspid valve regurgitation is trivial. No evidence of tricuspid stenosis. Aortic Valve: The aortic valve is tricuspid. Aortic valve regurgitation is not visualized. No aortic stenosis is present. Pulmonic Valve: The pulmonic valve was not well visualized. Pulmonic valve regurgitation is trivial. No evidence of pulmonic stenosis. Aorta: The aortic root is normal in size and  structure. Venous: The inferior vena cava is normal in size with greater than 50% respiratory variability, suggesting right atrial pressure of 3 mmHg. IAS/Shunts: No atrial level shunt detected by color flow Doppler.  LEFT VENTRICLE PLAX 2D LVIDd:         3.70 cm  Diastology LVIDs:         2.60 cm  LV e' medial:    5.55 cm/s LV PW:         0.90 cm  LV E/e' medial:  11.2 LV IVS:        1.00 cm  LV e' lateral:   10.60 cm/s LVOT diam:     1.80 cm  LV E/e' lateral: 5.9 LV SV:         41 LV SV Index:   26 LVOT Area:     2.54 cm  RIGHT VENTRICLE RV Basal diam:  2.50 cm RV S prime:     10.70 cm/s TAPSE (M-mode): 2.0 cm LEFT ATRIUM             Index       RIGHT ATRIUM          Index LA  diam:        2.60 cm 1.68 cm/m  RA Area:     9.97 cm LA Vol (A2C):   36.1 ml 23.30 ml/m RA Volume:   23.00 ml 14.85 ml/m LA Vol (A4C):   21.8 ml 14.07 ml/m LA Biplane Vol: 29.6 ml 19.11 ml/m  AORTIC VALVE LVOT Vmax:   85.00 cm/s LVOT Vmean:  52.100 cm/s LVOT VTI:    0.160 m  AORTA Ao Root diam: 2.60 cm MITRAL VALVE               TRICUSPID VALVE MV Area (PHT): 3.12 cm    TR Peak grad:   18.3 mmHg MV Decel Time: 243 msec    TR Vmax:        214.00 cm/s MV E velocity: 62.20 cm/s MV A velocity: 72.10 cm/s  SHUNTS MV E/A ratio:  0.86        Systemic VTI:  0.16 m                            Systemic Diam: 1.80 cm Kirk Ruths MD Electronically signed by Kirk Ruths MD Signature Date/Time: 06/24/2020/2:41:59 PM    Final    CUP PACEART REMOTE DEVICE CHECK  Result Date: 06/09/2020 ILR summary report received. Battery status OK. Normal device function. No new symptom, tachy, brady, or pause episodes. No new AF episodes. Monthly summary reports and ROV/PRN Kathy Breach, RN, CCDS, [employer]  CT HEAD CODE STROKE WO CONTRAST  Result Date: 06/24/2020 CLINICAL DATA:  Code stroke. Acute neuro deficit. Left-sided weakness. EXAM: CT HEAD WITHOUT CONTRAST TECHNIQUE: Contiguous axial images were obtained from the base of the skull through the vertex without intravenous contrast. COMPARISON:  CT head 06/30/2019 FINDINGS: Brain: Mild atrophy. Mild white matter changes bilaterally. Small chronic infarct right occipital lobe unchanged from prior studies. Negative for acute infarct, hemorrhage, mass. Vascular: Negative for hyperdense vessel Skull: Negative Sinuses/Orbits: Paranasal sinuses clear.  Negative orbit Other: None ASPECTS ([place] Stroke Program Early CT Score) - Ganglionic level infarction (caudate, lentiform nuclei, internal capsule, insula, M1-M3 cortex): 7 - Supraganglionic infarction (M4-M6 cortex): 3 Total score (0-10 with 10 being normal): 10 IMPRESSION: 1. No acute abnormality 2. Atrophy and  chronic ischemic change.  No intracranial  hemorrhage. 3. ASPECTS is 10 4. Code stroke imaging results were communicated on 06/24/2020 at 11:17 am to provider Lorrin Goodell via text page Electronically Signed   By: Franchot Gallo M.D.   On: 06/24/2020 11:18   VAS US CAROTID (at [facility] and [facility] only)  Result Date: 06/24/2020 Carotid Arterial Duplex Study Patient Name:  Jacqueline Bonilla  Date of Exam:   06/24/2020 Medical Rec #: [id number]           Accession #:    [id number] Date of Birth: 1955-04-30           Patient Gender: F Patient Age:   65Y Exam Location:  [facility] Procedure:      VAS US CAROTID Referring Phys: [id number] Orma Flaming --------------------------------------------------------------------------------  Indications:       CVA and Left sided weakness and facial droop. Risk Factors:      Hypertension, no history of smoking, prior CVA. Other Factors:     Vascular dementia. Comparison Study:  CTA from 06/2919 WNL Performing Technologist: Rogelia Rohrer RVT, RDMS  Examination Guidelines: A complete evaluation includes B-mode imaging, spectral Doppler, color Doppler, and power Doppler as needed of all accessible portions of each vessel. Bilateral testing is considered an integral part of a complete examination. Limited examinations for reoccurring indications may be performed as noted.  Right Carotid Findings: +----------+--------+--------+--------+------------------+------------------+           PSV cm/sEDV cm/sStenosisPlaque DescriptionComments           +----------+--------+--------+--------+------------------+------------------+ CCA Prox  67      14                                intimal thickening +----------+--------+--------+--------+------------------+------------------+ CCA Distal76      16                                intimal thickening +----------+--------+--------+--------+------------------+------------------+ ICA Prox  59      20                                                    +----------+--------+--------+--------+------------------+------------------+ ICA Distal70      27                                                   +----------+--------+--------+--------+------------------+------------------+ ECA       70      13                                                   +----------+--------+--------+--------+------------------+------------------+ +----------+--------+-------+----------------+-------------------+           PSV cm/sEDV cmsDescribe        Arm Pressure (mmHG) +----------+--------+-------+----------------+-------------------+ [id number]             Multiphasic, WNL                    +----------+--------+-------+----------------+-------------------+ +---------+--------+--+--------+--+---------+ VertebralPSV cm/s38EDV cm/s11Antegrade +---------+--------+--+--------+--+---------+  Left Carotid Findings: +----------+--------+--------+--------+------------------+------------------+  PSV cm/sEDV cm/sStenosisPlaque DescriptionComments           +----------+--------+--------+--------+------------------+------------------+ CCA Prox  73      13                                intimal thickening +----------+--------+--------+--------+------------------+------------------+ CCA Distal85      22                                intimal thickening +----------+--------+--------+--------+------------------+------------------+ ICA Prox  59      22                                                   +----------+--------+--------+--------+------------------+------------------+ ICA Distal63      23                                                   +----------+--------+--------+--------+------------------+------------------+ ECA       81      11                                                   +----------+--------+--------+--------+------------------+------------------+  +----------+--------+--------+----------------+-------------------+           PSV cm/sEDV cm/sDescribe        Arm Pressure (mmHG) +----------+--------+--------+----------------+-------------------+ [id number]             Multiphasic, WNL                    +----------+--------+--------+----------------+-------------------+ +---------+--------+--+--------+--+---------+ VertebralPSV cm/s46EDV cm/s11Antegrade +---------+--------+--+--------+--+---------+   Summary: Right Carotid: Velocities in the right ICA are consistent with a 1-39% stenosis. Left Carotid: Velocities in the left ICA are consistent with a 1-39% stenosis. Vertebrals:  Bilateral vertebral arteries demonstrate antegrade flow. Subclavians: Normal flow hemodynamics were seen in bilateral subclavian              arteries. *See table(s) above for measurements and observations.     Preliminary        Subjective: Reprorts improving strength with lle and foot  Objective: Vitals:   06/25/20 2317 06/26/20 0320 06/26/20 0732 06/26/20 1133  BP: (!) 145/89 (!) 147/95 137/87 123/78  Pulse: 86 82 70 86  Resp: 14 16 18 18   Temp: 98.2 F (36.8 C) 98.2 F (36.8 C) (!) 97.5 F (36.4 C) 97.7 F (36.5 C)  TempSrc: Oral Oral Oral Oral  SpO2: 100% 100%  100%  Weight:      Height:        Intake/Output Summary (Last 24 hours) at 06/26/2020 1225 Last data filed at 06/25/2020 1824 Gross per 24 hour  Intake 120 ml  Output --  Net 120 ml   Filed Weights   06/24/20 1100  Weight: 52.4 kg    Examination:  General exam: Appears calm and comfortable Respiratory system: Clear to auscultation. Respiratory effort normal. Cardiovascular system: S1 & S2 heard, RRR. No JVD, murmurs, rubs, gallops or clicks. No pedal edema. Gastrointestinal system: Abdomen is nondistended,  soft and nontender. No organomegaly or masses felt. Normal bowel sounds heard. Central nervous system: Alert and oriented. No focal neurological  deficits. Extremities: Improving Left lower extremity from the knee down 4 out of 5 bilateral upper extremities 5 out of 5 Skin: No rashes, lesions or ulcers Psychiatry: Judgement and insight appear normal. Mood & affect appropriate.     Data Reviewed: I have personally reviewed following labs and imaging studies  CBC: Recent Labs  Lab 06/24/20 1100 06/24/20 1106  WBC 6.3  --   NEUTROABS 4.5  --   HGB 13.1 14.3  HCT 40.4 42.0  MCV 92.2  --   PLT 214  --    Basic Metabolic Panel: Recent Labs  Lab 06/24/20 1100 06/24/20 1106  NA 138 139  K 4.2 4.1  CL 103 102  CO2 28  --   GLUCOSE 97 95  BUN 17 19  CREATININE 1.05* 1.10*  CALCIUM 9.4  --    GFR: Estimated Creatinine Clearance: 41.1 mL/min (A) (by C-G formula based on SCr of 1.1 mg/dL (H)). Liver Function Tests: Recent Labs  Lab 06/24/20 1100  AST 26  ALT 19  ALKPHOS 62  BILITOT 0.7  PROT 7.4  ALBUMIN 3.7   No results for input(s): LIPASE, AMYLASE in the last 168 hours. No results for input(s): AMMONIA in the last 168 hours. Coagulation Profile: Recent Labs  Lab 06/24/20 1100  INR 1.0   Cardiac Enzymes: No results for input(s): CKTOTAL, CKMB, CKMBINDEX, TROPONINI in the last 168 hours. BNP (last 3 results) No results for input(s): PROBNP in the last 8760 hours. HbA1C: Recent Labs    06/25/20 0447  HGBA1C 5.8*   CBG: Recent Labs  Lab 06/24/20 1103  GLUCAP 80   Lipid Profile: Recent Labs    06/25/20 0447  CHOL 164  HDL 45  LDLCALC 108*  TRIG 56  CHOLHDL 3.6   Thyroid Function Tests: No results for input(s): TSH, T4TOTAL, FREET4, T3FREE, THYROIDAB in the last 72 hours. Anemia Panel: No results for input(s): VITAMINB12, FOLATE, FERRITIN, TIBC, IRON, RETICCTPCT in the last 72 hours. Sepsis Labs: No results for input(s): PROCALCITON, LATICACIDVEN in the last 168 hours.  Recent Results (from the past 240 hour(s))  SARS CORONAVIRUS 2 (TAT 6-24 HRS) Nasopharyngeal Nasopharyngeal Swab      Status: None   Collection Time: 06/24/20  2:08 PM   Specimen: Nasopharyngeal Swab  Result Value Ref Range Status   SARS Coronavirus 2 NEGATIVE NEGATIVE Final    Comment: (NOTE) SARS-CoV-2 target nucleic acids are NOT DETECTED.  The SARS-CoV-2 RNA is generally detectable in upper and lower respiratory specimens during the acute phase of infection. Negative results do not preclude SARS-CoV-2 infection, do not rule out co-infections with other pathogens, and should not be used as the sole basis for treatment or other patient management decisions. Negative results must be combined with clinical observations, patient history, and epidemiological information. The expected result is Negative.  Fact Sheet for Patients: SugarRoll.be  Fact Sheet for Healthcare Providers: https://www.woods-mathews.com/  This test is not yet approved or cleared by the [place] FDA and  has been authorized for detection and/or diagnosis of SARS-CoV-2 by FDA under an Emergency Use Authorization (EUA). This EUA will remain  in effect (meaning this test can be used) for the duration of the COVID-19 declaration under Se ction 564(b)(1) of the Act, 21 U.S.C. section 360bbb-3(b)(1), unless the authorization is terminated or revoked sooner.  Performed at [facility], [place]  [address] [place], [place] [address]          Radiology Studies: MR ANGIO HEAD WO CONTRAST  Result Date: 06/24/2020 CLINICAL DATA:  Neuro deficit, acute stroke suspected EXAM: MRI HEAD WITHOUT CONTRAST MRA HEAD WITHOUT CONTRAST TECHNIQUE: Multiplanar, multi-echo pulse sequences of the brain and surrounding structures were acquired without intravenous contrast. Angiographic images of the Circle of Willis were acquired using MRA technique without intravenous contrast. COMPARISON:  And a CT code stroke. MRI head June 23, 2019. CTA June 30 2019. FINDINGS: MRI HEAD FINDINGS Brain: Punctate focus  of restricted diffusion in the high left frontal cortex (series 3, image 38). No significant edema or mass effect. Additional punctate areas of faint DWI signal in bilateral precentral gyri (series 3, images 37 and 39). Redemonstrated multiple remote cortical infarcts in bilateral frontal parietal and occipital lobes. Multiple bilateral cerebellar lacunar infarcts. Moderate scattered patchy confluent T2/FLAIR hyperintensities within the white matter most likely related to chronic microvascular ischemic disease. No hydrocephalus. Age advanced moderate cerebral atrophy with ex vacuo ventricular dilation. No acute hemorrhage. No mass lesion or abnormal mass effect. No extra-axial fluid collection. Vascular: See below. Skull and upper cervical spine: Normal marrow signal. Sinuses/Orbits: Clear sinuses.  Unremarkable orbits. Other: No sizable mastoid effusions. MRA HEAD FINDINGS Anterior circulation: Bilateral ICAs, MCAs, and ACAs are patent without flow-limiting proximal stenosis. No aneurysm identified. Posterior circulation: The visualized intradural vertebral arteries, basilar artery and posterior cerebral arteries are patent without flow limiting proximal stenosis. No aneurysm identified. IMPRESSION: MRI: 1. Punctate acute infarct in the high left frontal cortex. Additional faint punctate areas of DWI signal in bilateral precentral gyri are suspicious for additional acute or subacute infarcts, but could potentially represent artifact. Given potential involvement of multiple vascular territories, consider a central embolic etiology. 2. 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 3. Moderate atrophy. MRA: No large vessel occlusion or proximal flow limiting stenosis. Electronically Signed   By: Margaretha Sheffield MD   On: 06/24/2020 20:15   MR BRAIN WO CONTRAST  Result Date: 06/24/2020 CLINICAL DATA:  Neuro deficit, acute  stroke suspected EXAM: MRI HEAD WITHOUT CONTRAST MRA HEAD WITHOUT CONTRAST TECHNIQUE: Multiplanar, multi-echo pulse sequences of the brain and surrounding structures were acquired without intravenous contrast. Angiographic images of the Circle of Willis were acquired using MRA technique without intravenous contrast. COMPARISON:  And a CT code stroke. MRI head June 23, 2019. CTA June 30 2019. FINDINGS: MRI HEAD FINDINGS Brain: Punctate focus of restricted diffusion in the high left frontal cortex (series 3, image 38). No significant edema or mass effect. Additional punctate areas of faint DWI signal in bilateral precentral gyri (series 3, images 37 and 39). Redemonstrated multiple remote cortical infarcts in bilateral frontal parietal and occipital lobes. Multiple bilateral cerebellar lacunar infarcts. Moderate scattered patchy confluent T2/FLAIR hyperintensities within the white matter most likely related to chronic microvascular ischemic disease. No hydrocephalus. Age advanced moderate cerebral atrophy with ex vacuo ventricular dilation. No acute hemorrhage. No mass lesion or abnormal mass effect. No extra-axial fluid collection. Vascular: See below. Skull and upper cervical spine: Normal marrow signal. Sinuses/Orbits: Clear sinuses.  Unremarkable orbits. Other: No sizable mastoid effusions. MRA HEAD FINDINGS Anterior circulation: Bilateral ICAs, MCAs, and ACAs are patent without flow-limiting proximal stenosis. No aneurysm identified. Posterior circulation: The visualized intradural vertebral arteries, basilar artery and posterior cerebral arteries are patent without flow limiting proximal stenosis. No aneurysm identified. IMPRESSION: MRI: 1. Punctate acute infarct  in the high left frontal cortex. Additional faint punctate areas of DWI signal in bilateral precentral gyri are suspicious for additional acute or subacute infarcts, but could potentially represent artifact. Given potential involvement of multiple  vascular territories, consider a central embolic etiology. 2. 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 3. Moderate atrophy. MRA: No large vessel occlusion or proximal flow limiting stenosis. Electronically Signed   By: Margaretha Sheffield MD   On: 06/24/2020 20:15   ECHOCARDIOGRAM COMPLETE  Result Date: 06/24/2020    ECHOCARDIOGRAM REPORT   Patient Name:   Jacqueline Bonilla Date of Exam: 06/24/2020 Medical Rec #:  [id number]          Height:       64.0 in Accession #:    [id number]         Weight:       115.5 lb Date of Birth:  03-09-1955          BSA:          1.549 m Patient Age:    81 years           BP:           162/86 mmHg Patient Gender: F                  HR:           69 bpm. Exam Location:  Inpatient Procedure: 2D Echo, Cardiac Doppler and Color Doppler Indications:    CVA  History:        Patient has prior history of Echocardiogram examinations.                 Stroke, Signs/Symptoms:Dementia; Risk Factors:Hypertension.  Sonographer:    Dustin Flock Referring Phys: [id number] Orma Flaming  Sonographer Comments: Image acquisition challenging due to respiratory motion. IMPRESSIONS  1. Left ventricular ejection fraction, by estimation, is 55 to 60%. The left ventricle has normal function. The left ventricle has no regional wall motion abnormalities. Left ventricular diastolic parameters are consistent with Grade I diastolic dysfunction (impaired relaxation).  2. Right ventricular systolic function is normal. The right ventricular size is normal. There is normal pulmonary artery systolic pressure.  3. The mitral valve is normal in structure. Trivial mitral valve regurgitation. No evidence of mitral stenosis.  4. The aortic valve is tricuspid. Aortic valve regurgitation is not visualized. No aortic stenosis is present.  5. The inferior vena cava is normal in size with greater than 50% respiratory variability,  suggesting right atrial pressure of 3 mmHg. FINDINGS  Left Ventricle: Left ventricular ejection fraction, by estimation, is 55 to 60%. The left ventricle has normal function. The left ventricle has no regional wall motion abnormalities. The left ventricular internal cavity size was normal in size. There is  no left ventricular hypertrophy. Left ventricular diastolic parameters are consistent with Grade I diastolic dysfunction (impaired relaxation). Right Ventricle: The right ventricular size is normal. Right ventricular systolic function is normal. There is normal pulmonary artery systolic pressure. The tricuspid regurgitant velocity is 2.14 m/s, and with an assumed right atrial pressure of 3 mmHg,  the estimated right ventricular systolic pressure is 97.6 mmHg. Left Atrium: Left atrial size was normal in size. Right Atrium: Right atrial size was normal in size. Pericardium: There is no evidence of pericardial effusion. Mitral Valve: The mitral valve is normal in structure. Trivial mitral valve regurgitation. No evidence of mitral valve stenosis. Tricuspid Valve:  The tricuspid valve is normal in structure. Tricuspid valve regurgitation is trivial. No evidence of tricuspid stenosis. Aortic Valve: The aortic valve is tricuspid. Aortic valve regurgitation is not visualized. No aortic stenosis is present. Pulmonic Valve: The pulmonic valve was not well visualized. Pulmonic valve regurgitation is trivial. No evidence of pulmonic stenosis. Aorta: The aortic root is normal in size and structure. Venous: The inferior vena cava is normal in size with greater than 50% respiratory variability, suggesting right atrial pressure of 3 mmHg. IAS/Shunts: No atrial level shunt detected by color flow Doppler.  LEFT VENTRICLE PLAX 2D LVIDd:         3.70 cm  Diastology LVIDs:         2.60 cm  LV e' medial:    5.55 cm/s LV PW:         0.90 cm  LV E/e' medial:  11.2 LV IVS:        1.00 cm  LV e' lateral:   10.60 cm/s LVOT diam:     1.80  cm  LV E/e' lateral: 5.9 LV SV:         41 LV SV Index:   26 LVOT Area:     2.54 cm  RIGHT VENTRICLE RV Basal diam:  2.50 cm RV S prime:     10.70 cm/s TAPSE (M-mode): 2.0 cm LEFT ATRIUM             Index       RIGHT ATRIUM          Index LA diam:        2.60 cm 1.68 cm/m  RA Area:     9.97 cm LA Vol (A2C):   36.1 ml 23.30 ml/m RA Volume:   23.00 ml 14.85 ml/m LA Vol (A4C):   21.8 ml 14.07 ml/m LA Biplane Vol: 29.6 ml 19.11 ml/m  AORTIC VALVE LVOT Vmax:   85.00 cm/s LVOT Vmean:  52.100 cm/s LVOT VTI:    0.160 m  AORTA Ao Root diam: 2.60 cm MITRAL VALVE               TRICUSPID VALVE MV Area (PHT): 3.12 cm    TR Peak grad:   18.3 mmHg MV Decel Time: 243 msec    TR Vmax:        214.00 cm/s MV E velocity: 62.20 cm/s MV A velocity: 72.10 cm/s  SHUNTS MV E/A ratio:  0.86        Systemic VTI:  0.16 m                            Systemic Diam: 1.80 cm Kirk Ruths MD Electronically signed by Kirk Ruths MD Signature Date/Time: 06/24/2020/2:41:59 PM    Final    VAS US CAROTID (at [facility] and [facility] only)  Result Date: 06/24/2020 Carotid Arterial Duplex Study Patient Name:  Jacqueline Bonilla  Date of Exam:   06/24/2020 Medical Rec #: [id number]           Accession #:    [id number] Date of Birth: 17-Aug-1955           Patient Gender: F Patient Age:   65Y Exam Location:  [facility] Procedure:      VAS US CAROTID Referring Phys: [id number] Orma Flaming --------------------------------------------------------------------------------  Indications:       CVA and Left sided weakness and facial droop. Risk Factors:      Hypertension, no history of smoking,  prior CVA. Other Factors:     Vascular dementia. Comparison Study:  CTA from 06/2919 WNL Performing Technologist: Rogelia Rohrer RVT, RDMS  Examination Guidelines: A complete evaluation includes B-mode imaging, spectral Doppler, color Doppler, and power Doppler as needed of all accessible portions of each vessel. Bilateral testing is considered an integral part of a complete  examination. Limited examinations for reoccurring indications may be performed as noted.  Right Carotid Findings: +----------+--------+--------+--------+------------------+------------------+           PSV cm/sEDV cm/sStenosisPlaque DescriptionComments           +----------+--------+--------+--------+------------------+------------------+ CCA Prox  67      14                                intimal thickening +----------+--------+--------+--------+------------------+------------------+ CCA Distal76      16                                intimal thickening +----------+--------+--------+--------+------------------+------------------+ ICA Prox  59      20                                                   +----------+--------+--------+--------+------------------+------------------+ ICA Distal70      27                                                   +----------+--------+--------+--------+------------------+------------------+ ECA       70      13                                                   +----------+--------+--------+--------+------------------+------------------+ +----------+--------+-------+----------------+-------------------+           PSV cm/sEDV cmsDescribe        Arm Pressure (mmHG) +----------+--------+-------+----------------+-------------------+ [id number]             Multiphasic, WNL                    +----------+--------+-------+----------------+-------------------+ +---------+--------+--+--------+--+---------+ VertebralPSV cm/s38EDV cm/s11Antegrade +---------+--------+--+--------+--+---------+  Left Carotid Findings: +----------+--------+--------+--------+------------------+------------------+           PSV cm/sEDV cm/sStenosisPlaque DescriptionComments           +----------+--------+--------+--------+------------------+------------------+ CCA Prox  73      13                                intimal thickening  +----------+--------+--------+--------+------------------+------------------+ CCA Distal85      22                                intimal thickening +----------+--------+--------+--------+------------------+------------------+ ICA Prox  59      22                                                   +----------+--------+--------+--------+------------------+------------------+  ICA Distal63      23                                                   +----------+--------+--------+--------+------------------+------------------+ ECA       81      11                                                   +----------+--------+--------+--------+------------------+------------------+ +----------+--------+--------+----------------+-------------------+           PSV cm/sEDV cm/sDescribe        Arm Pressure (mmHG) +----------+--------+--------+----------------+-------------------+ [id number]             Multiphasic, WNL                    +----------+--------+--------+----------------+-------------------+ +---------+--------+--+--------+--+---------+ VertebralPSV cm/s46EDV cm/s11Antegrade +---------+--------+--+--------+--+---------+   Summary: Right Carotid: Velocities in the right ICA are consistent with a 1-39% stenosis. Left Carotid: Velocities in the left ICA are consistent with a 1-39% stenosis. Vertebrals:  Bilateral vertebral arteries demonstrate antegrade flow. Subclavians: Normal flow hemodynamics were seen in bilateral subclavian              arteries. *See table(s) above for measurements and observations.     Preliminary         Scheduled Meds:  vitamin C  250 mg Oral Daily   aspirin  81 mg Oral Daily   atorvastatin  40 mg Oral Daily   cholecalciferol  1,000 Units Oral Daily   clopidogrel  75 mg Oral Daily   enoxaparin (LOVENOX) injection  40 mg Subcutaneous Q24H   losartan  25 mg Oral Q1500   multivitamin with minerals  1 tablet Oral Daily   thiamine  50 mg  Oral Daily   Continuous Infusions:   LOS: 2 days    Time spent: 30 min    Nicolette Bang, MD [facility]  If 7PM-7AM, please contact night-coverage  06/26/2020, 12:25 PM

## 2020-06-26 NOTE — Progress Notes (Signed)
STROKE TEAM PROGRESS NOTE   INTERVAL HISTORY No acute event overnight. Neuro stable. Loop recorder interrogation no afib.   Vitals:   06/25/20 2317 06/26/20 0320 06/26/20 0732 06/26/20 1133  BP: (!) 145/89 (!) 147/95 137/87 123/78  Pulse: 86 82 70 86  Resp: 14 16 18 18   Temp: 98.2 F (36.8 C) 98.2 F (36.8 C) (!) 97.5 F (36.4 C) 97.7 F (36.5 C)  TempSrc: Oral Oral Oral Oral  SpO2: 100% 100%  100%  Weight:      Height:       CBC:  Recent Labs  Lab 06/24/20 1100 06/24/20 1106  WBC 6.3  --   NEUTROABS 4.5  --   HGB 13.1 14.3  HCT 40.4 42.0  MCV 92.2  --   PLT 214  --    Basic Metabolic Panel:  Recent Labs  Lab 06/24/20 1100 06/24/20 1106  NA 138 139  K 4.2 4.1  CL 103 102  CO2 28  --   GLUCOSE 97 95  BUN 17 19  CREATININE 1.05* 1.10*  CALCIUM 9.4  --    Lipid Panel:  Recent Labs  Lab 06/25/20 0447  CHOL 164  TRIG 56  HDL 45  CHOLHDL 3.6  VLDL 11  LDLCALC 108*   HgbA1c:  Recent Labs  Lab 06/25/20 0447  HGBA1C 5.8*   Urine Drug Screen: No results for input(s): LABOPIA, COCAINSCRNUR, LABBENZ, AMPHETMU, THCU, LABBARB in the last 168 hours.  Alcohol Level No results for input(s): ETH in the last 168 hours.  IMAGING past 24 hours No results found.  PHYSICAL EXAM General: Appears well-developed. Psych: Affect appropriate to situation Eyes: No scleral injection HENT: No OP obstrucion Head: Normocephalic.  Cardiovascular: Normal rate and regular rhythm.  Respiratory: Effort normal and breath sounds normal to anterior ascultation GI: Soft.  No distension. There is no tenderness.  Skin: WDI    Neurological Examination awake alert, no aphasia, follows simple commands, able to name and repeat.  Visual fields are full, no gaze palsy.  Slight left nasolabial fold flattening but facial movement symmetrical.  Tongue midline.  Bilateral upper extremity no drift, right lower extremity 5/5, left lower extremity proximal 4+/5, knee flexion 3/5, ankle  dorsiflexion 2+/5.  Sensation symmetrical, finger-to-nose intact bilaterally.  Gait not tested. Patient stated that she still has some right hand numbness tingling which is a residual from previous stroke.   ASSESSMENT/PLAN Jacqueline Bonilla is a 65 y.o. female with history of HTN, Osteoporosis, thrombocytopenia, microhematuria who fell d/t sudden onset left sided weakness while working out at the gym on treadmill. tPA was offered, but she decline. No LVO  Stroke: Bilateral small cortical infarcts, etiology appears embolic, however, report of Afib thus far on loop recorder  Code Stroke CT head No acute abnormality. Small vessel disease. Atrophy. ASPECTS 10.    MRI   left frontal cortex punctate infarct as well as right ACA 2 punctate infarcta.  MRA  neg Carotid Doppler unremarkable 2D Echo EF 55-60% LE venous doppler pending Loop recorder interrogation negative for A. fib LDL 108 HgbA1c 5.8 VTE prophylaxis - lovenox aspirin 81 mg daily prior to admission, now on aspirin 81 mg daily and clopidogrel 75 mg daily for 3 weeks and then Plavix alone. 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   Hospital day # 2   Jacqueline Hawking, MD PhD Stroke Neurology 06/26/2020 4:03 PM    To contact Stroke Continuity provider, please refer to [email or web address] After hours, contact General Neurology

## 2020-06-26 NOTE — Progress Notes (Signed)
LOOP INTERROGATION:  I used the [employer] iPad to interrogate Ms Bastedo's loop recorder device, LINQII. The data was reviewed through October 2021. There was no Atrial Fib noted on the session report. This was reported to Dr. Rogue Jury Metzger-Cihelka, ARNP-C, ANVP-BC Pager: [phone]

## 2020-06-26 NOTE — PMR Pre-admission (Signed)
    PMR Admission Coordinator Pre-Admission Assessment   Patient: Jacqueline Bonilla is an 65 y.o., female MRN: [id number] DOB: 08/10/1955 Height: 5' 4" (162.6 cm) Weight: 52.4 kg   Insurance Information HMO: yes    PPO:      PCP:      IPA:      80/20:      OTHER: PRIMARY: [employer]      Policy#: [id number]      Subscriber: Pt. CM Name: Dot Murphy      Phone#: [phone] ext [phone]     Fax#: [phone] Pre-Cert#: [id number]      Employer: Benefits:  Phone #: opened via [email or web address]     Name: Eff. Date:Eff Date: 06/01/2020- still active Deductible: does not have one OOP Max: $3,900 ($0 met) CIR: $295/day co-pay with a max co-pay of $1,770/admission (6 days) SNF: $0/day co-pay for days 1-20, $178/day co-pay for days 21-100, limited to 100 days/cal yr Outpatient:  $10-$20/visit co-pay Home Health:  100% coverage   SECONDARY: none   The "Data Collection Information Summary" for patients in Inpatient Rehabilitation Facilities with attached "Privacy Act Statement-Health Care Records" was provided and verbally reviewed with: Patient   Emergency Contact Information Contact Information       Name Relation Home Work Mobile    Brame,[name] Spouse     [phone]           Current Medical History  Patient Admitting Diagnosis: CVA History of Present Illness: Jacqueline Bonilla is a 65 y.o. female with medical history significant of past CVA, vascular dementia, HTN, thrombocytopenia and ILR who presented to the ED 6/24/2 for left sided weakness and facial droop.  She was at gym exercising on the elliptical and when she went to get off she fell to the floor.  She had sudden left sided weakness and left facial droop. She believes this happened around 10AM. EMS was called. She denies any slurred speech or word finding difficulties and states she does not recall any left arm weakness, but this was reported by EMS.  MRA showed no large vessel occlusion or proximal flow limiting  stenosis. MRI was revealing for punctate acute infarct in the high left frontal cortex with additional faint punctate areas of DWI signal in bilateral precentral gyri are suspicious for additional acute or subacute infarcts, and multiple cortical infarcts in bilateral frontal, parietal, and occipital lobes and multiple bilateral cerebellar lacunar infarcts. CIR was consulted to assist in return to PLOF.   Complete NIHSS TOTAL: 0   Patient's medical record from [facility] has been reviewed by the rehabilitation admission coordinator and physician.   Past Medical History      Past Medical History:  Diagnosis Date   Anemia     Anxiety     Depression     Dysmenorrhea     Endometriosis     Fibroid     Hypertension     Microhematuria      negative workup   Osteoporosis     Tachycardia     Thrombocytopenia (HCC)        Family History   family history includes Cancer in her father; Dementia in her mother; Heart attack in her maternal grandfather; Polymyositis in her sister.   Prior Rehab/Hospitalizations Has the patient had prior rehab or hospitalizations prior to admission? No   Has the patient had major surgery during 100 days prior to admission? No                 Current Medications   Current Facility-Administered Medications:   acetaminophen (TYLENOL) tablet 650 mg, 650 mg, Oral, Q4H PRN **OR** acetaminophen (TYLENOL) 160 MG/5ML solution 650 mg, 650 mg, Per Tube, Q4H PRN **OR** acetaminophen (TYLENOL) suppository 650 mg, 650 mg, Rectal, Q4H PRN, Wolfe, Allison, MD   ascorbic acid (VITAMIN C) tablet 250 mg, 250 mg, Oral, Daily, Wolfe, Allison, MD, 250 mg at 06/25/20 0934   [COMPLETED] aspirin chewable tablet 162 mg, 162 mg, Oral, Once, 162 mg at 06/24/20 1121 **FOLLOWED BY** aspirin chewable tablet 81 mg, 81 mg, Oral, Daily, Khaliqdina, Salman, MD, 81 mg at 06/25/20 0934   atorvastatin (LIPITOR) tablet 40 mg, 40 mg, Oral, Daily, Metzger-Cihelka, Desiree, NP, 40  mg at 06/25/20 0934   cholecalciferol (VITAMIN D3) tablet 1,000 Units, 1,000 Units, Oral, Daily, Wolfe, Allison, MD, 1,000 Units at 06/25/20 0934   [COMPLETED] clopidogrel (PLAVIX) tablet 300 mg, 300 mg, Oral, Once, 300 mg at 06/24/20 1120 **FOLLOWED BY** clopidogrel (PLAVIX) tablet 75 mg, 75 mg, Oral, Daily, Khaliqdina, Salman, MD, 75 mg at 06/25/20 0934   enoxaparin (LOVENOX) injection 40 mg, 40 mg, Subcutaneous, Q24H, Wolfe, Allison, MD, 40 mg at 06/25/20 1543   losartan (COZAAR) tablet 25 mg, 25 mg, Oral, Q1500, Wolfe, Allison, MD, 25 mg at 06/25/20 1542   multivitamin with minerals tablet 1 tablet, 1 tablet, Oral, Daily, Wolfe, Allison, MD, 1 tablet at 06/25/20 0934   senna-docusate (Senokot-S) tablet 1 tablet, 1 tablet, Oral, QHS PRN, Wolfe, Allison, MD   thiamine tablet 50 mg, 50 mg, Oral, Daily, Wolfe, Allison, MD, 50 mg at 06/25/20 0934   Patients Current Diet:  Diet Order                  Diet Heart Room service appropriate? Yes; Fluid consistency: Thin  Diet effective now                         Precautions / Restrictions Precautions Precautions: Fall Precaution Comments: ataxic, apraxia? Restrictions Weight Bearing Restrictions: No    Has the patient had 2 or more falls or a fall with injury in the past year? No   Prior Activity Level Community (5-7x/wk): Pt. was active in the community PTA   Prior Functional Level Self Care: Did the patient need help bathing, dressing, using the toilet or eating? Independent   Indoor Mobility: Did the patient need assistance with walking from room to room (with or without device)? Independent   Stairs: Did the patient need assistance with internal or external stairs (with or without device)? Independent   Functional Cognition: Did the patient need help planning regular tasks such as shopping or remembering to take medications? Independent   Home Assistive Devices / Equipment Home Equipment: Cane - single point   Prior Device  Use: Indicate devices/aids used by the patient prior to current illness, exacerbation or injury? None of the above   Current Functional Level Cognition   Arousal/Alertness: Awake/alert Overall Cognitive Status: History of cognitive impairments - at baseline Orientation Level: Oriented X4 General Comments: difficulty with memory, processing and complex though.  does follow commands with increased time. Attention: Focused, Sustained Focused Attention: Appears intact Sustained Attention: Appears intact Memory: Impaired Memory Impairment: Retrieval deficit Problem Solving: Appears intact Executive Function: Reasoning Reasoning: Appears intact    Extremity Assessment (includes Sensation/Coordination)   Upper Extremity Assessment: Defer to OT evaluation  Lower Extremity Assessment: LLE deficits/detail LLE Deficits / Details: MMT 4+/5, functionally weak which may be due to   ataxia or apraxia? LLE Coordination: decreased gross motor, decreased fine motor     ADLs   Overall ADL's : Needs assistance/impaired Eating/Feeding: Set up, Sitting Grooming: Wash/dry hands, Supervision/safety, Sitting Upper Body Dressing : Minimal assistance, Sitting Lower Body Dressing: Minimal assistance, Sitting/lateral leans, Moderate assistance Toilet Transfer: Moderate assistance, Ambulation, +2 for physical assistance, +2 for safety/equipment Toileting- Clothing Manipulation and Hygiene: Min guard, Sitting/lateral lean Functional mobility during ADLs: Moderate assistance, +2 for physical assistance, +2 for safety/equipment     Mobility   Overal bed mobility: Needs Assistance Bed Mobility: Supine to Sit Supine to sit: Min assist General bed mobility comments: up in recliner on arrival     Transfers   Overall transfer level: Needs assistance Equipment used: None Transfers: Sit to/from Stand Sit to Stand: Mod assist General transfer comment: required momentum and multiple attempts to achieve standing  from recliner. Posteior and R lateral lean noted     Ambulation / Gait / Stairs / Wheelchair Mobility   Ambulation/Gait Ambulation/Gait assistance: Mod assist, Max assist Gait Distance (Feet): 5 Feet Assistive device: Rolling walker (2 wheeled) Gait Pattern/deviations: Step-to pattern, Decreased step length - right, Decreased step length - left, Decreased stance time - left, Decreased stride length, Decreased weight shift to left, Shuffle, Ataxic, Trunk flexed, Narrow base of support General Gait Details: Difficulty advancing L LE with ambulation however able to perform L hip flexion in static standing. Tends to slide L foot forward. Demos difficulty processing movement and coordinating (apraxia?). Mod-maxA to maintain balance with ambulation due to posterior lean and intermittent knee buckling Gait velocity: decreased     Posture / Balance Balance Overall balance assessment: Needs assistance Sitting-balance support: Feet supported Sitting balance-Leahy Scale: Poor Postural control: Posterior lean Standing balance support: Bilateral upper extremity supported, Single extremity supported Standing balance-Leahy Scale: Poor Standing balance comment: needs external support     Special needs/care consideration none    Previous Home Environment (from acute therapy documentation) Living Arrangements: Spouse/significant other  Lives With: Spouse ([name]) Available Help at Discharge: Family, Available 24 hours/day Type of Home: House Home Layout: One level Home Access: Stairs to enter Entrance Stairs-Rails: None Entrance Stairs-Number of Steps: 4 Bathroom Shower/Tub: Walk-in shower Bathroom Toilet: Standard Bathroom Accessibility: No   Discharge Living Setting Plans for Discharge Living Setting: Patient's home Type of Home at Discharge: House Discharge Home Layout: One level Discharge Home Access: Stairs to enter Entrance Stairs-Rails: None Entrance Stairs-Number of Steps: 4 Discharge  Bathroom Shower/Tub: Walk-in shower Discharge Bathroom Toilet: Standard Discharge Bathroom Accessibility: No Does the patient have any problems obtaining your medications?: No   Social/Family/Support Systems Patient Roles: Spouse Contact Information: [phone] Anticipated Caregiver: [name] Gondek Anticipated Caregiver's Contact Information: [phone] Ability/Limitations of Caregiver: Can provide supervision only Caregiver Availability: 24/7 Discharge Plan Discussed with Primary Caregiver: Yes Is Caregiver In Agreement with Plan?: Yes Does Caregiver/Family have Issues with Lodging/Transportation while Pt is in Rehab?: No   Goals Patient/Family Goal for Rehab: PT/OT Supevision; SLP Mod I Expected length of stay: 16-19 days Pt/Family Agrees to Admission and willing to participate: Yes Program Orientation Provided & Reviewed with Pt/Caregiver Including Roles  & Responsibilities: Yes   Decrease burden of Care through IP rehab admission: Specialzed equipment needs, Decrease number of caregivers, Bowel and bladder program, and Patient/family education   Possible need for SNF placement upon discharge: not anticipated   Patient Condition: I have reviewed medical records from [facility], spoken with CM, and patient and spouse. I met with patient   at the bedside and discussed via phone for inpatient rehabilitation assessment.  Patient will benefit from ongoing PT, OT, and SLP, can actively participate in 3 hours of therapy a day 5 days of the week, and can make measurable gains during the admission.  Patient will also benefit from the coordinated team approach during an Inpatient Acute Rehabilitation admission.  The patient will receive intensive therapy as well as Rehabilitation physician, nursing, social worker, and care management interventions.  Due to safety, skin/wound care, disease management, medication administration, pain management, and patient education the  patient requires 24 hour a day rehabilitation nursing.  The patient is currently Mod-Max A with mobility and basic ADLs.  Discharge setting and therapy post discharge at home with home health is anticipated.  Patient has agreed to participate in the Acute Inpatient Rehabilitation Program and will admit today.   Preadmission Screen Completed By:  Laura B Staley, 06/26/2020 7:37 AM ______________________________________________________________________   Discussed status with Dr. Euretha Najarro on 06/28/20  at 930 and received approval for admission today.   Admission Coordinator:  Laura B Staley, CCC-SLP, time 1030/Date 06/28/20    Assessment/Plan: Diagnosis: Does the need for close, 24 hr/day Medical supervision in concert with the patient's rehab needs make it unreasonable for this patient to be served in a less intensive setting? Yes Co-Morbidities requiring supervision/potential complications: HTN, prior CVA, vascular dementia, thrombocyopenia, L hemiparesis and facial droop Due to bladder management, bowel management, safety, skin/wound care, disease management, medication administration, pain management, and patient education, does the patient require 24 hr/day rehab nursing? Yes Does the patient require coordinated care of a physician, rehab nurse, PT, OT, and SLP to address physical and functional deficits in the context of the above medical diagnosis(es)? Yes Addressing deficits in the following areas: balance, endurance, locomotion, strength, transferring, bathing, dressing, feeding, grooming, toileting, cognition, speech, and language Can the patient actively participate in an intensive therapy program of at least 3 hrs of therapy 5 days a week? Yes The potential for patient to make measurable gains while on inpatient rehab is good Anticipated functional outcomes upon discharge from inpatient rehab: modified independent and supervision PT, modified independent and supervision OT, modified independent  and supervision SLP Estimated rehab length of stay to reach the above functional goals is: 16-19 days Anticipated discharge destination: Home 10. Overall Rehab/Functional Prognosis: good     MD Signature:       

## 2020-06-27 ENCOUNTER — Inpatient Hospital Stay (HOSPITAL_COMMUNITY): Payer: Medicare HMO

## 2020-06-27 DIAGNOSIS — I639 Cerebral infarction, unspecified: Secondary | ICD-10-CM

## 2020-06-27 NOTE — Progress Notes (Signed)
STROKE TEAM PROGRESS NOTE   INTERVAL HISTORY RN at the bedside. No acute event overnight. Left side weakness continues to improve. LE venous doppler no DVT. Pending CIR.   Vitals:   06/26/20 2342 06/27/20 0420 06/27/20 0738 06/27/20 1130  BP: (!) 150/95 115/75 123/76 125/84  Pulse: 79 87 81 79  Resp: 18 17 18 18   Temp: 98 F (36.7 C) 97.6 F (36.4 C) 97.7 F (36.5 C) 98.1 F (36.7 C)  TempSrc: Oral Oral Oral Oral  SpO2: 100% 100%  100%  Weight:      Height:       CBC:  Recent Labs  Lab 06/24/20 1100 06/24/20 1106  WBC 6.3  --   NEUTROABS 4.5  --   HGB 13.1 14.3  HCT 40.4 42.0  MCV 92.2  --   PLT 214  --    Basic Metabolic Panel:  Recent Labs  Lab 06/24/20 1100 06/24/20 1106  NA 138 139  K 4.2 4.1  CL 103 102  CO2 28  --   GLUCOSE 97 95  BUN 17 19  CREATININE 1.05* 1.10*  CALCIUM 9.4  --    Lipid Panel:  Recent Labs  Lab 06/25/20 0447  CHOL 164  TRIG 56  HDL 45  CHOLHDL 3.6  VLDL 11  LDLCALC 108*   HgbA1c:  Recent Labs  Lab 06/25/20 0447  HGBA1C 5.8*   Urine Drug Screen: No results for input(s): LABOPIA, COCAINSCRNUR, LABBENZ, AMPHETMU, THCU, LABBARB in the last 168 hours.  Alcohol Level No results for input(s): ETH in the last 168 hours.  IMAGING past 24 hours VAS [name] LOWER EXTREMITY VENOUS (DVT)  Result Date: 06/27/2020  Lower Venous DVT Study Patient Name:  Jacqueline Bonilla Klemp  Date of Exam:   06/27/2020 Medical Rec #: [id number]           Accession #:    [id number] Date of Birth: Feb 15, 1955           Patient Gender: F Patient Age:   65Y Exam Location:  [facility] Procedure:      VAS [name] LOWER EXTREMITY VENOUS (DVT) Referring Phys: [id number] Rosalin Hawking --------------------------------------------------------------------------------  Indications: Stroke.  Risk Factors: None identified. Limitations: Ambient room light. Comparison Study: No prior studies. Performing Technologist: Oliver Hum RVT  Examination Guidelines: A complete evaluation  includes B-mode imaging, spectral Doppler, color Doppler, and power Doppler as needed of all accessible portions of each vessel. Bilateral testing is considered an integral part of a complete examination. Limited examinations for reoccurring indications may be performed as noted. The reflux portion of the exam is performed with the patient in reverse Trendelenburg.  +---------+---------------+---------+-----------+----------+--------------+ RIGHT    CompressibilityPhasicitySpontaneityPropertiesThrombus Aging +---------+---------------+---------+-----------+----------+--------------+ CFV      Full           Yes      Yes                                 +---------+---------------+---------+-----------+----------+--------------+ SFJ      Full                                                        +---------+---------------+---------+-----------+----------+--------------+ FV Prox  Full                                                        +---------+---------------+---------+-----------+----------+--------------+  FV Mid   Full                                                        +---------+---------------+---------+-----------+----------+--------------+ FV DistalFull                                                        +---------+---------------+---------+-----------+----------+--------------+ PFV      Full                                                        +---------+---------------+---------+-----------+----------+--------------+ POP      Full           Yes      Yes                                 +---------+---------------+---------+-----------+----------+--------------+ PTV      Full                                                        +---------+---------------+---------+-----------+----------+--------------+ PERO     Full                                                         +---------+---------------+---------+-----------+----------+--------------+   +---------+---------------+---------+-----------+----------+--------------+ LEFT     CompressibilityPhasicitySpontaneityPropertiesThrombus Aging +---------+---------------+---------+-----------+----------+--------------+ CFV      Full           Yes      Yes                                 +---------+---------------+---------+-----------+----------+--------------+ SFJ      Full                                                        +---------+---------------+---------+-----------+----------+--------------+ FV Prox  Full                                                        +---------+---------------+---------+-----------+----------+--------------+ FV Mid   Full                                                        +---------+---------------+---------+-----------+----------+--------------+  FV DistalFull                                                        +---------+---------------+---------+-----------+----------+--------------+ PFV      Full                                                        +---------+---------------+---------+-----------+----------+--------------+ POP      Full           Yes      Yes                                 +---------+---------------+---------+-----------+----------+--------------+ PTV      Full                                                        +---------+---------------+---------+-----------+----------+--------------+ PERO     Full                                                        +---------+---------------+---------+-----------+----------+--------------+     Summary: RIGHT: - There is no evidence of deep vein thrombosis in the lower extremity.  - No cystic structure found in the popliteal fossa.  LEFT: - There is no evidence of deep vein thrombosis in the lower extremity.  - No cystic structure found in the popliteal fossa.   *See table(s) above for measurements and observations.    Preliminary     PHYSICAL EXAM General: Appears well-developed. Psych: Affect appropriate to situation Eyes: No scleral injection HENT: No OP obstrucion Head: Normocephalic.  Cardiovascular: Normal rate and regular rhythm.  Respiratory: Effort normal and breath sounds normal to anterior ascultation GI: Soft.  No distension. There is no tenderness.  Skin: WDI    Neurological Examination awake alert, no aphasia, follows simple commands, able to name and repeat.  Visual fields are full, no gaze palsy.  Slight left nasolabial fold flattening but facial movement symmetrical.  Tongue midline.  Bilateral upper extremity no drift, right lower extremity 5/5, left lower extremity proximal 4+/5, knee flexion 3/5, ankle dorsiflexion 2+/5.  Sensation symmetrical, finger-to-nose intact bilaterally.  Gait not tested. Patient stated that she still has some right hand numbness tingling which is a residual from previous stroke.   ASSESSMENT/PLAN Ms. ZEANNA SUNDE is a 65 y.o. female with history of HTN, Osteoporosis, thrombocytopenia, microhematuria who fell d/t sudden onset left sided weakness while working out at the gym on treadmill. tPA was offered, but she decline. No LVO  Stroke: Bilateral small cortical infarcts, etiology appears embolic, however, report of Afib thus far on loop recorder  Code Stroke CT head No acute abnormality. Small vessel disease. Atrophy. ASPECTS 10.    MRI   left frontal cortex punctate infarct as well as  right ACA 2 punctate infarcta.  MRA  neg Carotid Doppler unremarkable 2D Echo EF 55-60% LE venous doppler no DVT Loop recorder interrogation negative for A. fib LDL 108 HgbA1c 5.8 VTE prophylaxis - lovenox aspirin 81 mg daily prior to admission, now on aspirin 81 mg daily and clopidogrel 75 mg daily for 3 weeks and then Plavix alone. Therapy recommendations: CIR Disposition:  pending  Hypertension Home  meds:  cozaar Stable Gradually normalize in 3-5 days Long-term BP goal normotensive  Hyperlipidemia Home meds:  none,  LDL 108, goal < 70 Add Lipitor 40mg   High intensity statin  Continue statin at discharge  Other Stroke Risk Factors Advanced Age >/= 2  Hx stroke/TIA - MRI showed Multiple remote cortical infarcts in bilateral frontal, parietal, and occipital lobes and multiple bilateral cerebellar lacunar infarcts.   Hospital day # 2  Neurology will sign off. Please call with questions. Pt will follow up with [name] at [facility] in about 4 weeks. Thanks for the consult.   Rosalin Hawking, MD PhD Stroke Neurology 06/27/2020 3:00 PM    To contact Stroke Continuity provider, please refer to [email or web address] After hours, contact General Neurology

## 2020-06-27 NOTE — Progress Notes (Signed)
Inpatient Rehab Admissions Coordinator:   I do not have insurance authorization for CIR but am following for potential admit pending medical readiness and insurance auth.   Clemens Catholic, [place], [place] Admissions Coordinator  [phone] ([place]) [phone] (office)

## 2020-06-27 NOTE — Progress Notes (Signed)
Bilateral lower extremity venous duplex has been completed. Preliminary results can be found in CV Proc through chart review.   06/27/20 1:15 PM Carlos Levering RVT

## 2020-06-27 NOTE — Progress Notes (Signed)
PROGRESS NOTE    Jacqueline Bonilla  OVF:[id number] DOB: [birth year] DOA: 06/24/2020 PCP: Horald Pollen, MD   Brief Narrative:  Jacqueline Bonilla is a 65 y.o. female with medical history significant of past CVA, vascular dementia, HTN, thrombocytopenia and ILR who presented for left sided weakness and facial droop.  She was at gym exercising on the elliptical and when she went to get off she fell to the floor.  She had sudden left sided weakness and left facial droop. She believes this happened around 10AM. EMS was called. She denies any slurred speech or word finding difficulties and states she does not recall any left arm weakness, but this was reported by EMS.  She can not tell me if her symptoms have improved. She states she tried to stand up and go to the bathroom and was having to lean very heavily on aide so they put her back in bed. She denies any headaches, vision changes, chest pain, palpitations, fever, abdominal pain, N/V/D. She denies any loss of sensation. She is right handed   Assessment & Plan:   Principal Problem:   Acute left-sided weakness Active Problems:   Essential hypertension   Cerebrovascular accident [facility])   Vascular dementia without behavioral disturbance ([place])   Cerebral infarction ([place])  Acute CVA with left-sided weakness Stroke work up initiated and neurology on board. She declined tpa in ER -MRI/MRA completed -echo completed, no acute findings -carotid dopplers 1-39 bilat -a1c 5.8 -lipid panel LDL above goal, c/w new statin -PT/OT/ST to eval-CIR -ASA 325mg  and plavix 300mg  given x 1 today. Will continue ASA 81mg  daily and plavix 75mg  daily x 3 weeks then asa 81 mg alone. -telemetry    Active Problems:   Essential hypertension -allow permissive HTN x 24 hours. Systolic <841. Will start home medication of losartaN TODAY     Cerebrovascular accident [facility]) Past hx of CVA. Per neurology recommendation continue ASA and plavix. Already has  neurologist outpatient.      Vascular dementia without behavioral disturbance ([facility]) -appears to be at baseline. Continue f/u with outpatient neurologist.   Thrombocytopenia -wnl today, monitor. F/u outpatient as needed.   Elevated creatinine Light IVF x 24 hours or less if she passes swallow screen. Repeat labs in AM.    Body mass index is 19.83 kg/m.   DVT prophylaxis: Lovenox SQ  Code Status: full     Code Status Orders  (From admission, onward)           Start     Ordered   06/24/20 1327  Full code  Continuous        06/24/20 1331           Code Status History     This patient has a current code status but no historical code status.      Family Communication: Bedside with husband Disposition Plan: The CIR  Consults called:  Neurology Admission status: Inpatient Patient requires continued telemetry monitoring, intensive physical therapy, neurologic intervention, and likely short-term rehab for intensive physical therapy secondary to NEW stroke related deficits.  Currently patient's not safe for discharge and is unstable and high risk of fall and injury   Consultants:  As above  Procedures:  MR ANGIO HEAD WO CONTRAST  Result Date: 06/24/2020 CLINICAL DATA:  Neuro deficit, acute stroke suspected EXAM: MRI HEAD WITHOUT CONTRAST MRA HEAD WITHOUT CONTRAST TECHNIQUE: Multiplanar, multi-echo pulse sequences of the brain and surrounding structures were acquired without intravenous contrast. Angiographic images of the Circle  of Willis were acquired using MRA technique without intravenous contrast. COMPARISON:  And a CT code stroke. MRI head June 23, 2019. CTA June 30 2019. FINDINGS: MRI HEAD FINDINGS Brain: Punctate focus of restricted diffusion in the high left frontal cortex (series 3, image 38). No significant edema or mass effect. Additional punctate areas of faint DWI signal in bilateral precentral gyri (series 3, images 37 and 39). Redemonstrated multiple remote  cortical infarcts in bilateral frontal parietal and occipital lobes. Multiple bilateral cerebellar lacunar infarcts. Moderate scattered patchy confluent T2/FLAIR hyperintensities within the white matter most likely related to chronic microvascular ischemic disease. No hydrocephalus. Age advanced moderate cerebral atrophy with ex vacuo ventricular dilation. No acute hemorrhage. No mass lesion or abnormal mass effect. No extra-axial fluid collection. Vascular: See below. Skull and upper cervical spine: Normal marrow signal. Sinuses/Orbits: Clear sinuses.  Unremarkable orbits. Other: No sizable mastoid effusions. MRA HEAD FINDINGS Anterior circulation: Bilateral ICAs, MCAs, and ACAs are patent without flow-limiting proximal stenosis. No aneurysm identified. Posterior circulation: The visualized intradural vertebral arteries, basilar artery and posterior cerebral arteries are patent without flow limiting proximal stenosis. No aneurysm identified. IMPRESSION: MRI: 1. Punctate acute infarct in the high left frontal cortex. Additional faint punctate areas of DWI signal in bilateral precentral gyri are suspicious for additional acute or subacute infarcts, but could potentially represent artifact. Given potential involvement of multiple vascular territories, consider a central embolic etiology. 2. 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 3. Moderate atrophy. MRA: No large vessel occlusion or proximal flow limiting stenosis. Electronically Signed   By: Margaretha Sheffield MD   On: 06/24/2020 20:15   MR BRAIN WO CONTRAST  Result Date: 06/24/2020 CLINICAL DATA:  Neuro deficit, acute stroke suspected EXAM: MRI HEAD WITHOUT CONTRAST MRA HEAD WITHOUT CONTRAST TECHNIQUE: Multiplanar, multi-echo pulse sequences of the brain and surrounding structures were acquired without intravenous contrast. Angiographic images of the Circle of  Willis were acquired using MRA technique without intravenous contrast. COMPARISON:  And a CT code stroke. MRI head June 23, 2019. CTA June 30 2019. FINDINGS: MRI HEAD FINDINGS Brain: Punctate focus of restricted diffusion in the high left frontal cortex (series 3, image 38). No significant edema or mass effect. Additional punctate areas of faint DWI signal in bilateral precentral gyri (series 3, images 37 and 39). Redemonstrated multiple remote cortical infarcts in bilateral frontal parietal and occipital lobes. Multiple bilateral cerebellar lacunar infarcts. Moderate scattered patchy confluent T2/FLAIR hyperintensities within the white matter most likely related to chronic microvascular ischemic disease. No hydrocephalus. Age advanced moderate cerebral atrophy with ex vacuo ventricular dilation. No acute hemorrhage. No mass lesion or abnormal mass effect. No extra-axial fluid collection. Vascular: See below. Skull and upper cervical spine: Normal marrow signal. Sinuses/Orbits: Clear sinuses.  Unremarkable orbits. Other: No sizable mastoid effusions. MRA HEAD FINDINGS Anterior circulation: Bilateral ICAs, MCAs, and ACAs are patent without flow-limiting proximal stenosis. No aneurysm identified. Posterior circulation: The visualized intradural vertebral arteries, basilar artery and posterior cerebral arteries are patent without flow limiting proximal stenosis. No aneurysm identified. IMPRESSION: MRI: 1. Punctate acute infarct in the high left frontal cortex. Additional faint punctate areas of DWI signal in bilateral precentral gyri are suspicious for additional acute or subacute infarcts, but could potentially represent artifact. Given potential involvement of multiple vascular territories, consider a central embolic etiology. 2. 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 3. Moderate atrophy.  MRA: No large vessel occlusion  or proximal flow limiting stenosis. Electronically Signed   By: Margaretha Sheffield MD   On: 06/24/2020 20:15   ECHOCARDIOGRAM COMPLETE  Result Date: 06/24/2020    ECHOCARDIOGRAM REPORT   Patient Name:   [facility] Date of Exam: 06/24/2020 Medical Rec #:  [id number]          Height:       64.0 in Accession #:    [id number]         Weight:       115.5 lb Date of Birth:  08-Feb-1955          BSA:          1.549 m Patient Age:    24 years           BP:           162/86 mmHg Patient Gender: F                  HR:           69 bpm. Exam Location:  Inpatient Procedure: 2D Echo, Cardiac Doppler and Color Doppler Indications:    CVA  History:        Patient has prior history of Echocardiogram examinations.                 Stroke, Signs/Symptoms:Dementia; Risk Factors:Hypertension.  Sonographer:    Dustin Flock Referring Phys: [id number] Orma Flaming  Sonographer Comments: Image acquisition challenging due to respiratory motion. IMPRESSIONS  1. Left ventricular ejection fraction, by estimation, is 55 to 60%. The left ventricle has normal function. The left ventricle has no regional wall motion abnormalities. Left ventricular diastolic parameters are consistent with Grade I diastolic dysfunction (impaired relaxation).  2. Right ventricular systolic function is normal. The right ventricular size is normal. There is normal pulmonary artery systolic pressure.  3. The mitral valve is normal in structure. Trivial mitral valve regurgitation. No evidence of mitral stenosis.  4. The aortic valve is tricuspid. Aortic valve regurgitation is not visualized. No aortic stenosis is present.  5. The inferior vena cava is normal in size with greater than 50% respiratory variability, suggesting right atrial pressure of 3 mmHg. FINDINGS  Left Ventricle: Left ventricular ejection fraction, by estimation, is 55 to 60%. The left ventricle has normal function. The left ventricle has no regional wall motion abnormalities. The left  ventricular internal cavity size was normal in size. There is  no left ventricular hypertrophy. Left ventricular diastolic parameters are consistent with Grade I diastolic dysfunction (impaired relaxation). Right Ventricle: The right ventricular size is normal. Right ventricular systolic function is normal. There is normal pulmonary artery systolic pressure. The tricuspid regurgitant velocity is 2.14 m/s, and with an assumed right atrial pressure of 3 mmHg,  the estimated right ventricular systolic pressure is 25.3 mmHg. Left Atrium: Left atrial size was normal in size. Right Atrium: Right atrial size was normal in size. Pericardium: There is no evidence of pericardial effusion. Mitral Valve: The mitral valve is normal in structure. Trivial mitral valve regurgitation. No evidence of mitral valve stenosis. Tricuspid Valve: The tricuspid valve is normal in structure. Tricuspid valve regurgitation is trivial. No evidence of tricuspid stenosis. Aortic Valve: The aortic valve is tricuspid. Aortic valve regurgitation is not visualized. No aortic stenosis is present. Pulmonic Valve: The pulmonic valve was not well visualized. Pulmonic valve regurgitation is trivial. No evidence of pulmonic stenosis. Aorta: The aortic root is normal in size and  structure. Venous: The inferior vena cava is normal in size with greater than 50% respiratory variability, suggesting right atrial pressure of 3 mmHg. IAS/Shunts: No atrial level shunt detected by color flow Doppler.  LEFT VENTRICLE PLAX 2D LVIDd:         3.70 cm  Diastology LVIDs:         2.60 cm  LV e' medial:    5.55 cm/s LV PW:         0.90 cm  LV E/e' medial:  11.2 LV IVS:        1.00 cm  LV e' lateral:   10.60 cm/s LVOT diam:     1.80 cm  LV E/e' lateral: 5.9 LV SV:         41 LV SV Index:   26 LVOT Area:     2.54 cm  RIGHT VENTRICLE RV Basal diam:  2.50 cm RV S prime:     10.70 cm/s TAPSE (M-mode): 2.0 cm LEFT ATRIUM             Index       RIGHT ATRIUM          Index LA  diam:        2.60 cm 1.68 cm/m  RA Area:     9.97 cm LA Vol (A2C):   36.1 ml 23.30 ml/m RA Volume:   23.00 ml 14.85 ml/m LA Vol (A4C):   21.8 ml 14.07 ml/m LA Biplane Vol: 29.6 ml 19.11 ml/m  AORTIC VALVE LVOT Vmax:   85.00 cm/s LVOT Vmean:  52.100 cm/s LVOT VTI:    0.160 m  AORTA Ao Root diam: 2.60 cm MITRAL VALVE               TRICUSPID VALVE MV Area (PHT): 3.12 cm    TR Peak grad:   18.3 mmHg MV Decel Time: 243 msec    TR Vmax:        214.00 cm/s MV E velocity: 62.20 cm/s MV A velocity: 72.10 cm/s  SHUNTS MV E/A ratio:  0.86        Systemic VTI:  0.16 m                            Systemic Diam: 1.80 cm Kirk Ruths MD Electronically signed by Kirk Ruths MD Signature Date/Time: 06/24/2020/2:41:59 PM    Final    CUP PACEART REMOTE DEVICE CHECK  Result Date: 06/09/2020 ILR summary report received. Battery status OK. Normal device function. No new symptom, tachy, brady, or pause episodes. No new AF episodes. Monthly summary reports and ROV/PRN Kathy Breach, RN, CCDS, [employer]  CT HEAD CODE STROKE WO CONTRAST  Result Date: 06/24/2020 CLINICAL DATA:  Code stroke. Acute neuro deficit. Left-sided weakness. EXAM: CT HEAD WITHOUT CONTRAST TECHNIQUE: Contiguous axial images were obtained from the base of the skull through the vertex without intravenous contrast. COMPARISON:  CT head 06/30/2019 FINDINGS: Brain: Mild atrophy. Mild white matter changes bilaterally. Small chronic infarct right occipital lobe unchanged from prior studies. Negative for acute infarct, hemorrhage, mass. Vascular: Negative for hyperdense vessel Skull: Negative Sinuses/Orbits: Paranasal sinuses clear.  Negative orbit Other: None ASPECTS (Rich Stroke Program Early CT Score) - Ganglionic level infarction (caudate, lentiform nuclei, internal capsule, insula, M1-M3 cortex): 7 - Supraganglionic infarction (M4-M6 cortex): 3 Total score (0-10 with 10 being normal): 10 IMPRESSION: 1. No acute abnormality 2. Atrophy and  chronic ischemic change.  No intracranial  hemorrhage. 3. ASPECTS is 10 4. Code stroke imaging results were communicated on 06/24/2020 at 11:17 am to provider Lorrin Goodell via text page Electronically Signed   By: Franchot Gallo M.D.   On: 06/24/2020 11:18   VAS US CAROTID (at [facility] and [facility] only)  Result Date: 06/24/2020 Carotid Arterial Duplex Study Patient Name:  [facility]  Date of Exam:   06/24/2020 Medical Rec #: [id number]           Accession #:    [id number] Date of Birth: June 09, 1955           Patient Gender: F Patient Age:   65Y Exam Location:  [facility] Procedure:      VAS US CAROTID Referring Phys: [id number] Orma Flaming --------------------------------------------------------------------------------  Indications:       CVA and Left sided weakness and facial droop. Risk Factors:      Hypertension, no history of smoking, prior CVA. Other Factors:     Vascular dementia. Comparison Study:  CTA from 06/2919 WNL Performing Technologist: Rogelia Rohrer RVT, RDMS  Examination Guidelines: A complete evaluation includes B-mode imaging, spectral Doppler, color Doppler, and power Doppler as needed of all accessible portions of each vessel. Bilateral testing is considered an integral part of a complete examination. Limited examinations for reoccurring indications may be performed as noted.  Right Carotid Findings: +----------+--------+--------+--------+------------------+------------------+           PSV cm/sEDV cm/sStenosisPlaque DescriptionComments           +----------+--------+--------+--------+------------------+------------------+ CCA Prox  67      14                                intimal thickening +----------+--------+--------+--------+------------------+------------------+ CCA Distal76      16                                intimal thickening +----------+--------+--------+--------+------------------+------------------+ ICA Prox  59      20                                                    +----------+--------+--------+--------+------------------+------------------+ ICA Distal70      27                                                   +----------+--------+--------+--------+------------------+------------------+ ECA       70      13                                                   +----------+--------+--------+--------+------------------+------------------+ +----------+--------+-------+----------------+-------------------+           PSV cm/sEDV cmsDescribe        Arm Pressure (mmHG) +----------+--------+-------+----------------+-------------------+ [id number]             Multiphasic, WNL                    +----------+--------+-------+----------------+-------------------+ +---------+--------+--+--------+--+---------+ VertebralPSV cm/s38EDV cm/s11Antegrade +---------+--------+--+--------+--+---------+  Left Carotid Findings: +----------+--------+--------+--------+------------------+------------------+  PSV cm/sEDV cm/sStenosisPlaque DescriptionComments           +----------+--------+--------+--------+------------------+------------------+ CCA Prox  73      13                                intimal thickening +----------+--------+--------+--------+------------------+------------------+ CCA Distal85      22                                intimal thickening +----------+--------+--------+--------+------------------+------------------+ ICA Prox  59      22                                                   +----------+--------+--------+--------+------------------+------------------+ ICA Distal63      23                                                   +----------+--------+--------+--------+------------------+------------------+ ECA       81      11                                                   +----------+--------+--------+--------+------------------+------------------+  +----------+--------+--------+----------------+-------------------+           PSV cm/sEDV cm/sDescribe        Arm Pressure (mmHG) +----------+--------+--------+----------------+-------------------+ [id number]             Multiphasic, WNL                    +----------+--------+--------+----------------+-------------------+ +---------+--------+--+--------+--+---------+ VertebralPSV cm/s46EDV cm/s11Antegrade +---------+--------+--+--------+--+---------+   Summary: Right Carotid: Velocities in the right ICA are consistent with a 1-39% stenosis. Left Carotid: Velocities in the left ICA are consistent with a 1-39% stenosis. Vertebrals:  Bilateral vertebral arteries demonstrate antegrade flow. Subclavians: Normal flow hemodynamics were seen in bilateral subclavian              arteries. *See table(s) above for measurements and observations.     Preliminary        Subjective: Reports left foot/LE still weak and she is unstable  Objective: Vitals:   06/26/20 2022 06/26/20 2342 06/27/20 0420 06/27/20 0738  BP: 133/79 (!) 150/95 115/75 123/76  Pulse: 81 79 87 81  Resp: 16 18 17 18   Temp: 98.2 F (36.8 C) 98 F (36.7 C) 97.6 F (36.4 C) 97.7 F (36.5 C)  TempSrc: Oral Oral Oral Oral  SpO2: 99% 100% 100%   Weight:      Height:       No intake or output data in the 24 hours ending 06/27/20 1106 Filed Weights   06/24/20 1100  Weight: 52.4 kg    Examination:    General exam: Appears calm and comfortable Respiratory system: Clear to auscultation. Respiratory effort normal. Cardiovascular system: S1 & S2 heard, RRR. No JVD, murmurs, rubs, gallops or clicks. No pedal edema. Gastrointestinal system: Abdomen is nondistended, soft and nontender. No organomegaly or masses felt. Normal bowel sounds heard. Central nervous system: Alert and oriented.  No focal neurological deficits. Extremities: Improving Left lower extremity from the knee down 4 out of 5 bilateral upper extremities  5 out of 5 Skin: No rashes, lesions or ulcers Psychiatry: Judgement and insight appear normal. Mood & affect appropriate    Data Reviewed: I have personally reviewed following labs and imaging studies  CBC: Recent Labs  Lab 06/24/20 1100 06/24/20 1106  WBC 6.3  --   NEUTROABS 4.5  --   HGB 13.1 14.3  HCT 40.4 42.0  MCV 92.2  --   PLT 214  --    Basic Metabolic Panel: Recent Labs  Lab 06/24/20 1100 06/24/20 1106  NA 138 139  K 4.2 4.1  CL 103 102  CO2 28  --   GLUCOSE 97 95  BUN 17 19  CREATININE 1.05* 1.10*  CALCIUM 9.4  --    GFR: Estimated Creatinine Clearance: 41.1 mL/min (A) (by C-G formula based on SCr of 1.1 mg/dL (H)). Liver Function Tests: Recent Labs  Lab 06/24/20 1100  AST 26  ALT 19  ALKPHOS 62  BILITOT 0.7  PROT 7.4  ALBUMIN 3.7   No results for input(s): LIPASE, AMYLASE in the last 168 hours. No results for input(s): AMMONIA in the last 168 hours. Coagulation Profile: Recent Labs  Lab 06/24/20 1100  INR 1.0   Cardiac Enzymes: No results for input(s): CKTOTAL, CKMB, CKMBINDEX, TROPONINI in the last 168 hours. BNP (last 3 results) No results for input(s): PROBNP in the last 8760 hours. HbA1C: Recent Labs    06/25/20 0447  HGBA1C 5.8*   CBG: Recent Labs  Lab 06/24/20 1103  GLUCAP 80   Lipid Profile: Recent Labs    06/25/20 0447  CHOL 164  HDL 45  LDLCALC 108*  TRIG 56  CHOLHDL 3.6   Thyroid Function Tests: No results for input(s): TSH, T4TOTAL, FREET4, T3FREE, THYROIDAB in the last 72 hours. Anemia Panel: No results for input(s): VITAMINB12, FOLATE, FERRITIN, TIBC, IRON, RETICCTPCT in the last 72 hours. Sepsis Labs: No results for input(s): PROCALCITON, LATICACIDVEN in the last 168 hours.  Recent Results (from the past 240 hour(s))  SARS CORONAVIRUS 2 (TAT 6-24 HRS) Nasopharyngeal Nasopharyngeal Swab     Status: None   Collection Time: 06/24/20  2:08 PM   Specimen: Nasopharyngeal Swab  Result Value Ref Range Status    SARS Coronavirus 2 NEGATIVE NEGATIVE Final    Comment: (NOTE) SARS-CoV-2 target nucleic acids are NOT DETECTED.  The SARS-CoV-2 RNA is generally detectable in upper and lower respiratory specimens during the acute phase of infection. Negative results do not preclude SARS-CoV-2 infection, do not rule out co-infections with other pathogens, and should not be used as the sole basis for treatment or other patient management decisions. Negative results must be combined with clinical observations, patient history, and epidemiological information. The expected result is Negative.  Fact Sheet for Patients: SugarRoll.be  Fact Sheet for Healthcare Providers: https://www.woods-mathews.com/  This test is not yet approved or cleared by the [place] FDA and  has been authorized for detection and/or diagnosis of SARS-CoV-2 by FDA under an Emergency Use Authorization (EUA). This EUA will remain  in effect (meaning this test can be used) for the duration of the COVID-19 declaration under Se ction 564(b)(1) of the Act, 21 U.S.C. section 360bbb-3(b)(1), unless the authorization is terminated or revoked sooner.  Performed at [facility], [place] [address] [place], [place] [address]          Radiology Studies: No results found.  Scheduled Meds:  vitamin C  250 mg Oral Daily   aspirin  81 mg Oral Daily   atorvastatin  40 mg Oral Daily   cholecalciferol  1,000 Units Oral Daily   clopidogrel  75 mg Oral Daily   enoxaparin (LOVENOX) injection  40 mg Subcutaneous Q24H   losartan  25 mg Oral Q1500   multivitamin with minerals  1 tablet Oral Daily   thiamine  50 mg Oral Daily   Continuous Infusions:   LOS: 2 days    Time spent: 25 min    Nicolette Bang, MD [facility]  If 7PM-7AM, please contact night-coverage  06/27/2020, 11:07 AM

## 2020-06-27 NOTE — Progress Notes (Addendum)
Physical Therapy Treatment Patient Details Name: Jacqueline Bonilla MRN: [id number] DOB: Sep 08, 1955 Today's Date: 06/27/2020    History of Present Illness 65 y.o. female presents to [facility] ED on 06/24/2020 with L weakness and facial droop. CT head negative, MRI found punctate acute infarct in high L frontal cortex and multiple remote cortical infarcts in bilateral frontal, parietal, and occipital lobes and multile bilateral cerebellar lacunar infarcts. PMH includes CVA, vascular dementia, HTN, thrombocytopenia and ILR.    PT Comments    Pt received in recliner. Mod assist sit to stand and +2 mod/HHA ambulation 20'. Assist for balance, lateral weight shift, and posture. Cues for LLE step length and sequencing.  Pt tends to take too big of a step with LLE. Increased flexion noted at trunk/hips with fatigue. Pt returned to recliner at end of session.   Follow Up Recommendations  CIR     Equipment Recommendations  Rolling walker with 5" wheels    Recommendations for Other Services       Precautions / Restrictions Precautions Precautions: Fall;Other (comment) Precaution Comments: ataxic, apraxia?    Mobility  Bed Mobility               General bed mobility comments: up in recliner on arrival    Transfers Overall transfer level: Needs assistance Equipment used: Ambulation equipment used Transfers: Sit to/from Stand Sit to Stand: Mod assist         General transfer comment: cues for hand placement and sequencing  Ambulation/Gait Ambulation/Gait assistance: +2 physical assistance;Mod assist Gait Distance (Feet): 20 Feet Assistive device: 2 person hand held assist Gait Pattern/deviations: Step-to pattern;Step-through pattern;Narrow base of support;Ataxic;Decreased weight shift to left Gait velocity: decreased Gait velocity interpretation: <1.8 ft/sec, indicate of risk for recurrent falls General Gait Details: bilat assist to maintain posture/balance. Cues for sequencing  and step length LLE. Assist with lateral weight shifting. Decreased proprioception LLE.   Stairs             Wheelchair Mobility    Modified Rankin (Stroke Patients Only) Modified Rankin (Stroke Patients Only) Pre-Morbid Rankin Score: Moderate disability Modified Rankin: Moderately severe disability     Balance Overall balance assessment: Needs assistance Sitting-balance support: Feet supported;Single extremity supported Sitting balance-Leahy Scale: Fair     Standing balance support: Bilateral upper extremity supported;During functional activity Standing balance-Leahy Scale: Poor Standing balance comment: reliant on external support                            Cognition Arousal/Alertness: Awake/alert Behavior During Therapy: WFL for tasks assessed/performed Overall Cognitive Status: History of cognitive impairments - at baseline                                        Exercises      General Comments        Pertinent Vitals/Pain Pain Assessment: No/denies pain    Home Living                      Prior Function            PT Goals (current goals can now be found in the care plan section) Acute Rehab PT Goals Patient Stated Goal: to walk Progress towards PT goals: Progressing toward goals    Frequency    Min 4X/week      PT Plan  Current plan remains appropriate    Co-evaluation              AM-PAC PT "6 Clicks" Mobility   Outcome Measure  Help needed turning from your back to your side while in a flat bed without using bedrails?: A Little Help needed moving from lying on your back to sitting on the side of a flat bed without using bedrails?: A Little Help needed moving to and from a bed to a chair (including a wheelchair)?: A Lot Help needed standing up from a chair using your arms (e.g., wheelchair or bedside chair)?: A Lot Help needed to walk in hospital room?: A Lot Help needed climbing 3-5 steps  with a railing? : Total 6 Click Score: 13    End of Session Equipment Utilized During Treatment: Gait belt Activity Tolerance: Patient tolerated treatment well Patient left: in chair;with call bell/phone within reach;with chair alarm set Nurse Communication: Mobility status PT Visit Diagnosis: Unsteadiness on feet (R26.81);Muscle weakness (generalized) (M62.81);Other abnormalities of gait and mobility (R26.89);Ataxic gait (R26.0);Difficulty in walking, not elsewhere classified (R26.2);Other symptoms and signs involving the nervous system (R29.898);Apraxia (R48.2)     Time: 1042-1100 PT Time Calculation (min) (ACUTE ONLY): 18 min  Charges:  $Gait Training: 8-22 mins                     Lorrin Goodell, PT  Office # [phone] Pager [phone]    [name] 06/27/2020, 11:57 AM

## 2020-06-27 NOTE — Progress Notes (Signed)
Occupational Therapy Treatment Patient Details Name: Jacqueline Bonilla MRN: [id number] DOB: 1955/05/17 Today's Date: 06/27/2020    History of present illness 65 y.o. female presents to [facility] ED on 06/24/2020 with L weakness and facial droop. CT head negative, MRI found punctate acute infarct in high L frontal cortex and multiple remote cortical infarcts in bilateral frontal, parietal, and occipital lobes and multile bilateral cerebellar lacunar infarcts. PMH includes CVA, vascular dementia, HTN, thrombocytopenia and ILR.   OT comments  Pt progressing towards established OT goals. Currently, pt performing LB dressing with mod A, and functional mobility with mod A +2 for physical assistance. Pt participating in BUE exercises while standing at sink with Min A-Min Guard for balance supporting self with single upper extremity on sink. Pt continues to present with decreased balance, strength, and activity tolerance. Due to pt motivation, support, and significant change in functional status, recommend OT at [facility] and will continue to follow acutely to optimize independence in ADLs.    Follow Up Recommendations  CIR    Equipment Recommendations  3 in 1 bedside commode;Tub/shower seat    Recommendations for Other Services Rehab consult    Precautions / Restrictions Precautions Precautions: Fall;Other (comment) Precaution Comments: ataxic, apraxia? Restrictions Weight Bearing Restrictions: No       Mobility Bed Mobility               General bed mobility comments: pt in recliner upon arrival    Transfers Overall transfer level: Needs assistance Equipment used: 2 person hand held assist Transfers: Sit to/from Stand Sit to Stand: Mod assist;+2 physical assistance;+2 safety/equipment         General transfer comment: Cues for sequencing to take larger steps with fatigue.    Balance Overall balance assessment: Needs assistance Sitting-balance support: Feet supported Sitting  balance-Leahy Scale: Fair   Postural control: Left lateral lean Standing balance support: Bilateral upper extremity supported;During functional activity Standing balance-Leahy Scale: Poor Standing balance comment: reliant on external support. weight shifting with Min A and intermittent Min Guard A during UE exercise standing at sink                           ADL either performed or assessed with clinical judgement   ADL Overall ADL's : Needs assistance/impaired                     Lower Body Dressing: Moderate assistance;Sitting/lateral leans Lower Body Dressing Details (indicate cue type and reason): Pt requiring mod A to don sock due to inability to place sock over toes. Toilet Transfer: Moderate assistance;Ambulation;+2 for physical assistance;+2 for safety/equipment           Functional mobility during ADLs: Moderate assistance;+2 for physical assistance;+2 for safety/equipment General ADL Comments: Pt perfroming functional moblity with mod A +2 hand held assist.     Vision   Vision Assessment?: No apparent visual deficits   Perception     Praxis      Cognition Arousal/Alertness: Awake/alert Behavior During Therapy: WFL for tasks assessed/performed Overall Cognitive Status: History of cognitive impairments - at baseline                                 General Comments: decreased memory, increased time to follow commands.        Exercises Exercises: Other exercises Other Exercises Other Exercises: Pt performing BUE exercises standing at  sink. Pt drawing "x" in 7 circles drawn on mirror with LUE and erasing; then drawing x in 7 shapes drawn on mirror using RUE and erasing. Maintaining balance supporting self on sink with Min guard A-Min A. Pt reporting she is able to feel herself leaning to L side. Pt with one LOB while attempting to place cap on marker, requiring mod A to correct.   Shoulder Instructions       General Comments  Husband present. Finishing up TENS on arrival.    Pertinent Vitals/ Pain       Pain Assessment: No/denies pain Faces Pain Scale: No hurt  Home Living                                          Prior Functioning/Environment              Frequency  Min 2X/week        Progress Toward Goals  OT Goals(current goals can now be found in the care plan section)  Progress towards OT goals: Progressing toward goals  Acute Rehab OT Goals Patient Stated Goal: go to CIR OT Goal Formulation: With patient/family Time For Goal Achievement: 07/09/20 Potential to Achieve Goals: Good ADL Goals Pt Will Perform Grooming: with set-up;sitting Pt Will Perform Upper Body Bathing: with supervision;sitting Pt Will Perform Lower Body Bathing: with min guard assist;sit to/from stand Pt Will Perform Upper Body Dressing: with supervision;sitting Pt Will Perform Lower Body Dressing: with min guard assist;sit to/from stand Pt Will Transfer to Toilet: with min guard assist;ambulating Pt Will Perform Toileting - Clothing Manipulation and hygiene: with supervision;sitting/lateral leans Pt/caregiver will Perform Home Exercise Program: With theraband;With Supervision;With written HEP provided;Increased strength  Plan Discharge plan remains appropriate    Co-evaluation                 AM-PAC OT "6 Clicks" Daily Activity     Outcome Measure   Help from another person eating meals?: A Little Help from another person taking care of personal grooming?: A Little Help from another person toileting, which includes using toliet, bedpan, or urinal?: A Lot Help from another person bathing (including washing, rinsing, drying)?: A Lot Help from another person to put on and taking off regular upper body clothing?: A Little Help from another person to put on and taking off regular lower body clothing?: A Lot 6 Click Score: 15    End of Session Equipment Utilized During Treatment: Gait  belt  OT Visit Diagnosis: Unsteadiness on feet (R26.81);Other abnormalities of gait and mobility (R26.89);Muscle weakness (generalized) (M62.81);Ataxia, unspecified (R27.0);Other symptoms and signs involving cognitive function   Activity Tolerance Patient tolerated treatment well   Patient Left in chair;with call bell/phone within reach;with chair alarm set   Nurse Communication Mobility status        Time: 8453-6468 OT Time Calculation (min): 32 min  Charges: OT General Charges $OT Visit: 1 Visit OT Treatments $Therapeutic Activity: 23-37 mins  Shanda Howells, OTDS    Shanda Howells 06/27/2020, 5:12 PM

## 2020-06-27 NOTE — Progress Notes (Signed)
Carelink Summary Report / Loop Recorder 

## 2020-06-27 NOTE — H&P (Signed)
Physical Medicine and Rehabilitation Admission H&P    Chief Complaint  Patient presents with   Stroke with functional deficits.     HPI: Jacqueline Bonilla. 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 She has waxing and waning of symptoms per EMS and CT head negative and patien tiwth mild left aided weakness and ataxia. She decline tPA. MRI/MRA brain done revealing punctate infarct in high left frontal coretex and faint areas of DWI signal in bilateral precentral gyri question acute/subacute infarct or artifact. Incidental note of multiple remote cortical infarcts in cerebral and cerebellar lacunar infarcts. Stroke felt to be embolic and loop interrogated but negative for A fib. Dr. Erlinda Hong recommended ASA/Plavix X 3 weeks followed by ASA alone. Carotid dopplers negative for significant ICA stenosis. BLE dopplers negative for DVT. Patient with residual mild cognitive deficits with moderate word finding deficits, left sided weakness with apraxia and needs extra time for processing. CIR recommended due to functional decline.   Pt reports just got back sensation in RUE/ however hasn't lost any sensation in L side with this new stroke.  Denies pain.  LBM this AM- is very regular.  Notes still has trouble with writing from previus stroke  with R hand.  Was warm, sp took off socks, but now feet cold.    Review of Systems  Constitutional:  Negative for chills and fever.  HENT:  Negative for ear pain and hearing loss.   Eyes:  Negative for blurred vision and double vision.  Respiratory:  Positive for cough. Negative for shortness of breath.   Cardiovascular:  Negative for chest pain and palpitations.   Gastrointestinal:  Negative for constipation.  Genitourinary:  Negative for dysuria, frequency and urgency.  Musculoskeletal:  Positive for back pain (numbness due to back injury--can hurt with palpation/stretching.). Negative for myalgias and neck pain.  Skin:  Negative for itching and rash.  Neurological:  Positive for sensory change (? left foot.) and weakness. Negative for dizziness and headaches.  Psychiatric/Behavioral:  Positive for memory loss.   All other systems reviewed and are negative.   Past Medical History:  Diagnosis Date   Anemia    Anxiety    Depression    Dysmenorrhea    Endometriosis    Fibroid    Hypertension    Microhematuria    negative workup   Osteoporosis    Tachycardia    Thrombocytopenia [facility])     Past Surgical History:  Procedure Laterality Date   BREAST BIOPSY     CESAREAN SECTION     hysteroscopic resection     implantable loop recorder implant  10/21/2019   Medtronic Reveal Linq model LNQ 22 ([place] [id number]) implantable loop recorder    Family History  Problem Relation Age of Onset   Cancer Father        non hodgkin lymphoma & skin   Heart attack Maternal Grandfather    Dementia Mother    Polymyositis Sister     Social History: Married. Independent without AD and goes to the gym 7 days a week for about 30 minutes. She is a retired Optometrist, She does not use tobacco, smokeless tobacco, ETOH or illicit drugs.    Allergies  Allergen Reactions  Avelox [Moxifloxacin]    Ciprofloxacin Hives   Floraquin [Iodoquinol]     No cipro or others   Iohexol      Code: HIVES, Desc: HIVES S/P IVP MANY YRS AGO...NO PROBLEM W/O PREP TODAY/A.[place], Onset Date: [id number]    Levaquin [Levofloxacin]    Quinolones    Shellfish Allergy Itching    Itching under the chin. Described by patient but not seen by husband.    Medications Prior to Admission  Medication Sig Dispense Refill   Ascorbic Acid (VITAMIN C PO) Take 1 tablet by mouth daily.      aspirin EC 81 MG tablet Take 1 tablet (81 mg total) by mouth daily. Swallow whole. 30 tablet 11   [START ON 06/29/2020] atorvastatin (LIPITOR) 40 MG tablet Take 1 tablet (40 mg total) by mouth daily.     Cholecalciferol (VITAMIN D3 GUMMIES PO) Take 1,000 mcg by mouth daily.     [START ON 06/29/2020] clopidogrel (PLAVIX) 75 MG tablet Take 1 tablet (75 mg total) by mouth daily.     losartan (COZAAR) 25 MG tablet Take 1 tablet (25 mg total) by mouth daily. (Patient taking differently: Take 25 mg by mouth daily in the afternoon.) 90 tablet 3   Multiple Vitamins-Minerals (ZINC PO) Take 1 tablet by mouth daily.     Thiamine HCl (VITAMIN B-1 PO) Take 1 tablet by mouth daily.      Drug Regimen Review  Drug regimen was reviewed and remains appropriate with no significant issues identified  Home: Home Living Family/patient expects to be discharged to:: Other (Comment) (CIR) Living Arrangements: Spouse/significant other Available Help at Discharge: Family, Available 24 hours/day Type of Home: House Home Access: Stairs to enter [employer] of Steps: 4 Entrance Stairs-Rails: None Home Layout: One level Bathroom Shower/Tub: Multimedia programmer: Standard Bathroom Accessibility: No Home Equipment: Kasandra Knudsen - single point  Lives With: Spouse Psychologist, counselling)   Functional History: Prior Function Level of Independence: Independent with assistive device(s) Comments: Spouse completes all meal prep, community mobility, IADL, bill payment and med management.  She walked without an AD, and was able to perfrom her own self care. Active at the gym  Functional Status:  Mobility: Bed Mobility Overal bed mobility: Needs Assistance Bed Mobility: Supine to Sit Supine to sit: Min assist General bed mobility comments: pt in recliner upon arrival Transfers Overall transfer level: Needs assistance Equipment used: 2 person hand held assist Transfers: Sit to/from Stand Sit to Stand: Mod assist, +2  physical assistance, +2 safety/equipment General transfer comment: Cues for sequencing to take larger steps with fatigue. Ambulation/Gait Ambulation/Gait assistance: +2 physical assistance, Mod assist Gait Distance (Feet): 20 Feet Assistive device: 2 person hand held assist Gait Pattern/deviations: Step-to pattern, Step-through pattern, Narrow base of support, Ataxic, Decreased weight shift to left General Gait Details: bilat assist to maintain posture/balance. Cues for sequencing and step length LLE. Assist with lateral weight shifting. Decreased proprioception LLE. Gait velocity: decreased Gait velocity interpretation: <1.8 ft/sec, indicate of risk for recurrent falls    ADL: ADL Overall ADL's : Needs assistance/impaired Eating/Feeding: Set up, Sitting Grooming: Wash/dry hands, Supervision/safety, Sitting Upper Body Dressing : Minimal assistance, Sitting Lower Body Dressing: Moderate assistance, Sitting/lateral leans Lower Body Dressing Details (indicate cue type and reason): Pt requiring mod A to don sock due to inability to place sock over toes. Toilet Transfer: Moderate assistance, Ambulation, +2 for physical assistance, +2 for safety/equipment Toileting- Clothing Manipulation and Hygiene: Min guard, Sitting/lateral lean Functional mobility during ADLs: Moderate assistance, +2  for physical assistance, +2 for safety/equipment General ADL Comments: Pt perfroming functional moblity with mod A +2 hand held assist.  Cognition: Cognition Overall Cognitive Status: History of cognitive impairments - at baseline Arousal/Alertness: Awake/alert Orientation Level: Oriented X4 Attention: Focused, Sustained Focused Attention: Appears intact Sustained Attention: Appears intact Memory: Impaired Memory Impairment: Retrieval deficit Problem Solving: Appears intact Executive Function: Reasoning Reasoning: Appears intact Cognition Arousal/Alertness: Awake/alert Behavior During Therapy: WFL  for tasks assessed/performed Overall Cognitive Status: History of cognitive impairments - at baseline General Comments: decreased memory, increased time to follow commands.   Blood pressure 116/71, pulse 92, temperature 98.1 F (36.7 C), temperature source Oral, resp. rate 20, height 5\' 4"  (1.626 m), weight 52.4 kg, last menstrual period 01/02/2003, SpO2 99 %. Physical Exam Vitals and nursing note reviewed. Exam conducted with a chaperone present.  Constitutional:      Appearance: Normal appearance.     Comments: Awake, alert, appropriate, sitting up in bedside chair, PA in room, NAD- sing song voice  HENT:     Head: Normocephalic and atraumatic.     Comments: Very mild R facial droop from previous stroke; tongue midline    Right Ear: External ear normal.     Left Ear: External ear normal.     Nose: Nose normal. No congestion.     Mouth/Throat:     Mouth: Mucous membranes are moist.     Pharynx: Oropharynx is clear. No oropharyngeal exudate.  Eyes:     General:        Right eye: No discharge.        Left eye: No discharge.     Extraocular Movements: Extraocular movements intact.     Comments: Mild R lid lag (pt says chronic- as well as pupil on R very slightly smaller than L)  Cardiovascular:     Rate and Rhythm: Normal rate and regular rhythm.     Heart sounds: Normal heart sounds. No murmur heard.   No gallop.  Pulmonary:     Comments: CTA B/L- no W/R/R- good air movement   Abdominal:     Comments: Soft, NT, ND, (+)BS - hypoactive  Musculoskeletal:     Cervical back: Normal range of motion and neck supple.     Comments: RUE- 4+/5 in deltoid, biceps, triceps; WE 5-/5, and grip and FA 4/5 LUE- 5-/5 in all muscles  RLE- 5/5 in all muscles tested LLE- HF 5-/5, KE/KF 4+/5, DF 1/5, PF 2/5, EHL 2-/5   Skin:    General: Skin is warm and dry.     Comments: Bilateral feet with mild dependent cyanosis--cool/cold to touch.   Is blanchable B/L No bogginess or wounds on heels  B/L Multiple scars on lumbar spine- mainly on L side- small swelling above scars   Neurological:     Mental Status: She is alert and oriented to person, place, and time.     Comments: Able to answer orientation and biographic questions without difficulty. Speech clear. Able to follow basic commands without difficulty.   Psychiatric:     Comments: Sing song voice - appropriate, however a little flat    No results found for this or any previous visit (from the past 48 hour(s)). VAS [name] LOWER EXTREMITY VENOUS (DVT)  Result Date: 06/27/2020  Lower Venous DVT Study Patient Name:  AMIT MELOY Rominger  Date of Exam:   06/27/2020 Medical Rec #: [id number]           Accession #:    [id number] Date of  Birth: 1955-03-18           Patient Gender: F Patient Age:   65Y Exam Location:  [facility] Procedure:      VAS [name] LOWER EXTREMITY VENOUS (DVT) Referring Phys: [id number] Rosalin Hawking --------------------------------------------------------------------------------  Indications: Stroke.  Risk Factors: None identified. Limitations: Ambient room light. Comparison Study: No prior studies. Performing Technologist: Oliver Hum RVT  Examination Guidelines: A complete evaluation includes B-mode imaging, spectral Doppler, color Doppler, and power Doppler as needed of all accessible portions of each vessel. Bilateral testing is considered an integral part of a complete examination. Limited examinations for reoccurring indications may be performed as noted. The reflux portion of the exam is performed with the patient in reverse Trendelenburg.  +---------+---------------+---------+-----------+----------+--------------+ RIGHT    CompressibilityPhasicitySpontaneityPropertiesThrombus Aging +---------+---------------+---------+-----------+----------+--------------+ CFV      Full           Yes      Yes                                 +---------+---------------+---------+-----------+----------+--------------+ SFJ       Full                                                        +---------+---------------+---------+-----------+----------+--------------+ FV Prox  Full                                                        +---------+---------------+---------+-----------+----------+--------------+ FV Mid   Full                                                        +---------+---------------+---------+-----------+----------+--------------+ FV DistalFull                                                        +---------+---------------+---------+-----------+----------+--------------+ PFV      Full                                                        +---------+---------------+---------+-----------+----------+--------------+ POP      Full           Yes      Yes                                 +---------+---------------+---------+-----------+----------+--------------+ PTV      Full                                                        +---------+---------------+---------+-----------+----------+--------------+  PERO     Full                                                        +---------+---------------+---------+-----------+----------+--------------+   +---------+---------------+---------+-----------+----------+--------------+ LEFT     CompressibilityPhasicitySpontaneityPropertiesThrombus Aging +---------+---------------+---------+-----------+----------+--------------+ CFV      Full           Yes      Yes                                 +---------+---------------+---------+-----------+----------+--------------+ SFJ      Full                                                        +---------+---------------+---------+-----------+----------+--------------+ FV Prox  Full                                                        +---------+---------------+---------+-----------+----------+--------------+ FV Mid   Full                                                         +---------+---------------+---------+-----------+----------+--------------+ FV DistalFull                                                        +---------+---------------+---------+-----------+----------+--------------+ PFV      Full                                                        +---------+---------------+---------+-----------+----------+--------------+ POP      Full           Yes      Yes                                 +---------+---------------+---------+-----------+----------+--------------+ PTV      Full                                                        +---------+---------------+---------+-----------+----------+--------------+ PERO     Full                                                        +---------+---------------+---------+-----------+----------+--------------+  Summary: RIGHT: - There is no evidence of deep vein thrombosis in the lower extremity.  - No cystic structure found in the popliteal fossa.  LEFT: - There is no evidence of deep vein thrombosis in the lower extremity.  - No cystic structure found in the popliteal fossa.  *See table(s) above for measurements and observations. Electronically signed by Ruta Hinds MD on 06/27/2020 at 7:04:52 PM.    Final        Medical Problem List and Plan: 1.  New L hemiparesis secondary to R frontal cortex infarct with hx of R sided weakness from previous stroke esp RUE  -patient may  shower  -ELOS/Goals: 16-19 days- mod I 2.  Antithrombotics: -DVT/anticoagulation:  Pharmaceutical: Lovenox  -antiplatelet therapy: DAPT X 3 weeks followed by ASA alone.  3. Pain Management: N/A 4. Mood: LCSW to follow for evaluation and support.   -antipsychotic agents: N/a 5. Neuropsych: This patient may not be fully capable of making decisions on her own behalf. 6. Skin/Wound Care: Routine pressure relief measures. 7. Fluids/Electrolytes/Nutrition: Monitor I/O. Check lytes in am. 8.  HTN: Monitor BP tid--continue Cozaar.  9. H/o thrombocytopenia: Recheck CBC in am as now on DAPT 11. Dyslipidemia: Continue Lipitor.      Bary Leriche, PA-C 06/28/2020  I have personally performed a face to face diagnostic evaluation of this patient and formulated the key components of the plan.  Additionally, I have personally reviewed laboratory data, imaging studies, as well as relevant notes and concur with the physician assistant's documentation above.   The patient's status has not changed from the original H&P.  Any changes in documentation from the acute care chart have been noted above.

## 2020-06-27 NOTE — Plan of Care (Signed)
  Problem: Education: Goal: Knowledge of disease or condition will improve Outcome: Progressing Goal: Knowledge of secondary prevention will improve Outcome: Progressing Goal: Knowledge of patient specific risk factors addressed and post discharge goals established will improve Outcome: Progressing   Problem: Ischemic Stroke/TIA Tissue Perfusion: Goal: Complications of ischemic stroke/TIA will be minimized Outcome: Progressing   Problem: Education: Goal: Knowledge of General Education information will improve Description: Including pain rating scale, medication(s)/side effects and non-pharmacologic comfort measures Outcome: Progressing   Problem: Health Behavior/Discharge Planning: Goal: Ability to manage health-related needs will improve Outcome: Progressing   Problem: Clinical Measurements: Goal: Ability to maintain clinical measurements within normal limits will improve Outcome: Progressing Goal: Will remain free from infection Outcome: Progressing Goal: Diagnostic test results will improve Outcome: Progressing Goal: Respiratory complications will improve Outcome: Progressing Goal: Cardiovascular complication will be avoided Outcome: Progressing   Problem: Nutrition: Goal: Adequate nutrition will be maintained Outcome: Progressing   Problem: Coping: Goal: Level of anxiety will decrease Outcome: Progressing   Problem: Elimination: Goal: Will not experience complications related to bowel motility Outcome: Progressing Goal: Will not experience complications related to urinary retention Outcome: Progressing   Problem: Pain Managment: Goal: General experience of comfort will improve Outcome: Progressing   Problem: Safety: Goal: Ability to remain free from injury will improve Outcome: Progressing   Problem: Skin Integrity: Goal: Risk for impaired skin integrity will decrease Outcome: Progressing

## 2020-06-28 ENCOUNTER — Encounter (HOSPITAL_COMMUNITY): Payer: Self-pay | Admitting: Physical Medicine & Rehabilitation

## 2020-06-28 ENCOUNTER — Other Ambulatory Visit: Payer: Self-pay

## 2020-06-28 ENCOUNTER — Inpatient Hospital Stay (HOSPITAL_COMMUNITY)
Admission: RE | Admit: 2020-06-28 | Discharge: 2020-07-08 | DRG: 057 | Disposition: A | Discharge status: Home / Self Care | Payer: Medicare HMO | Source: Intra-hospital | Attending: Physical Medicine & Rehabilitation | Admitting: Physical Medicine & Rehabilitation

## 2020-06-28 DIAGNOSIS — I69398 Other sequelae of cerebral infarction: Secondary | ICD-10-CM

## 2020-06-28 DIAGNOSIS — I63321 Cerebral infarction due to thrombosis of right anterior cerebral artery: Secondary | ICD-10-CM | POA: Diagnosis not present

## 2020-06-28 DIAGNOSIS — F0150 Vascular dementia without behavioral disturbance: Secondary | ICD-10-CM | POA: Diagnosis not present

## 2020-06-28 DIAGNOSIS — Z7982 Long term (current) use of aspirin: Secondary | ICD-10-CM

## 2020-06-28 DIAGNOSIS — E785 Hyperlipidemia, unspecified: Secondary | ICD-10-CM | POA: Diagnosis present

## 2020-06-28 DIAGNOSIS — M810 Age-related osteoporosis without current pathological fracture: Secondary | ICD-10-CM | POA: Diagnosis present

## 2020-06-28 DIAGNOSIS — I10 Essential (primary) hypertension: Secondary | ICD-10-CM | POA: Diagnosis not present

## 2020-06-28 DIAGNOSIS — Z91013 Allergy to seafood: Secondary | ICD-10-CM

## 2020-06-28 DIAGNOSIS — Z881 Allergy status to other antibiotic agents status: Secondary | ICD-10-CM

## 2020-06-28 DIAGNOSIS — I639 Cerebral infarction, unspecified: Secondary | ICD-10-CM | POA: Diagnosis present

## 2020-06-28 DIAGNOSIS — Z91041 Radiographic dye allergy status: Secondary | ICD-10-CM | POA: Diagnosis not present

## 2020-06-28 DIAGNOSIS — R2689 Other abnormalities of gait and mobility: Secondary | ICD-10-CM | POA: Diagnosis present

## 2020-06-28 DIAGNOSIS — Z79899 Other long term (current) drug therapy: Secondary | ICD-10-CM | POA: Diagnosis not present

## 2020-06-28 DIAGNOSIS — D649 Anemia, unspecified: Secondary | ICD-10-CM

## 2020-06-28 DIAGNOSIS — R531 Weakness: Secondary | ICD-10-CM

## 2020-06-28 DIAGNOSIS — I69354 Hemiplegia and hemiparesis following cerebral infarction affecting left non-dominant side: Secondary | ICD-10-CM | POA: Diagnosis not present

## 2020-06-28 DIAGNOSIS — Z807 Family history of other malignant neoplasms of lymphoid, hematopoietic and related tissues: Secondary | ICD-10-CM

## 2020-06-28 DIAGNOSIS — I69319 Unspecified symptoms and signs involving cognitive functions following cerebral infarction: Secondary | ICD-10-CM

## 2020-06-28 DIAGNOSIS — I69392 Facial weakness following cerebral infarction: Secondary | ICD-10-CM

## 2020-06-28 DIAGNOSIS — I6300 Cerebral infarction due to thrombosis of unspecified precerebral artery: Secondary | ICD-10-CM

## 2020-06-28 DIAGNOSIS — R7989 Other specified abnormal findings of blood chemistry: Secondary | ICD-10-CM

## 2020-06-28 DIAGNOSIS — I63521 Cerebral infarction due to unspecified occlusion or stenosis of right anterior cerebral artery: Secondary | ICD-10-CM | POA: Diagnosis not present

## 2020-06-28 DIAGNOSIS — Z8249 Family history of ischemic heart disease and other diseases of the circulatory system: Secondary | ICD-10-CM | POA: Diagnosis not present

## 2020-06-28 MED ORDER — CLOPIDOGREL BISULFATE 75 MG PO TABS
75.0000 mg | ORAL_TABLET | Freq: Every day | ORAL | Status: DC
Start: 2020-06-29 — End: 2020-07-07

## 2020-06-28 MED ORDER — ACETAMINOPHEN 325 MG PO TABS: 325.0000 mg | ORAL_TABLET | ORAL | Status: DC | PRN

## 2020-06-28 MED ORDER — POLYETHYLENE GLYCOL 3350 17 G PO PACK: 17.0000 g | PACK | Freq: Every day | ORAL | Status: DC | PRN

## 2020-06-28 MED ORDER — ASCORBIC ACID 500 MG PO TABS
250.0000 mg | ORAL_TABLET | Freq: Every day | ORAL | Status: DC
  Administered 2020-06-29 – 2020-07-08 (×10): 250 mg via ORAL
  Filled 2020-06-28 (×10): qty 1

## 2020-06-28 MED ORDER — ATORVASTATIN CALCIUM 40 MG PO TABS: 40.0000 mg | ORAL_TABLET | Freq: Every day | ORAL | Status: DC

## 2020-06-28 MED ORDER — ENOXAPARIN SODIUM 40 MG/0.4ML IJ SOSY
40.0000 mg | PREFILLED_SYRINGE | INTRAMUSCULAR | Status: DC
  Administered 2020-06-28 – 2020-07-04 (×7): 40 mg via SUBCUTANEOUS
  Filled 2020-06-28 (×8): qty 0.4

## 2020-06-28 MED ORDER — BLOOD PRESSURE CONTROL BOOK
Freq: Once | Status: AC
  Filled 2020-06-28: qty 1

## 2020-06-28 MED ORDER — GUAIFENESIN-DM 100-10 MG/5ML PO SYRP
5.0000 mL | ORAL_SOLUTION | Freq: Four times a day (QID) | ORAL | Status: DC | PRN
  Filled 2020-06-28: qty 10

## 2020-06-28 MED ORDER — PROCHLORPERAZINE EDISYLATE 10 MG/2ML IJ SOLN: 5.0000 mg | Freq: Four times a day (QID) | INTRAMUSCULAR | Status: DC | PRN

## 2020-06-28 MED ORDER — TRAZODONE HCL 50 MG PO TABS: 25.0000 mg | ORAL_TABLET | Freq: Every evening | ORAL | Status: DC | PRN

## 2020-06-28 MED ORDER — BISACODYL 10 MG RE SUPP: 10.0000 mg | Freq: Every day | RECTAL | Status: DC | PRN

## 2020-06-28 MED ORDER — PROCHLORPERAZINE 25 MG RE SUPP: 12.5000 mg | Freq: Four times a day (QID) | RECTAL | Status: DC | PRN

## 2020-06-28 MED ORDER — DIPHENHYDRAMINE HCL 12.5 MG/5ML PO ELIX
12.5000 mg | ORAL_SOLUTION | Freq: Four times a day (QID) | ORAL | Status: DC | PRN
  Administered 2020-06-29: 25 mg via ORAL
  Filled 2020-06-28: qty 10

## 2020-06-28 MED ORDER — ASPIRIN EC 81 MG PO TBEC: 81.0000 mg | DELAYED_RELEASE_TABLET | Freq: Every day | ORAL | 11 refills | Status: DC

## 2020-06-28 MED ORDER — ALUM & MAG HYDROXIDE-SIMETH 200-200-20 MG/5ML PO SUSP: 30.0000 mL | ORAL | Status: DC | PRN

## 2020-06-28 MED ORDER — FLEET ENEMA 7-19 GM/118ML RE ENEM: 1.0000 | ENEMA | Freq: Once | RECTAL | Status: DC | PRN

## 2020-06-28 MED ORDER — THIAMINE HCL 100 MG PO TABS
100.0000 mg | ORAL_TABLET | Freq: Every day | ORAL | Status: DC
  Administered 2020-06-29 – 2020-07-08 (×10): 100 mg via ORAL
  Filled 2020-06-28 (×10): qty 1

## 2020-06-28 MED ORDER — ATORVASTATIN CALCIUM 40 MG PO TABS
40.0000 mg | ORAL_TABLET | Freq: Every day | ORAL | Status: DC
  Administered 2020-06-29 – 2020-07-08 (×10): 40 mg via ORAL
  Filled 2020-06-28 (×10): qty 1

## 2020-06-28 MED ORDER — ASPIRIN EC 81 MG PO TBEC
81.0000 mg | DELAYED_RELEASE_TABLET | Freq: Every day | ORAL | Status: DC
  Administered 2020-06-29 – 2020-07-08 (×10): 81 mg via ORAL
  Filled 2020-06-28 (×10): qty 1

## 2020-06-28 MED ORDER — CLOPIDOGREL BISULFATE 75 MG PO TABS
75.0000 mg | ORAL_TABLET | Freq: Every day | ORAL | Status: DC
  Administered 2020-06-29 – 2020-07-08 (×10): 75 mg via ORAL
  Filled 2020-06-28 (×10): qty 1

## 2020-06-28 MED ORDER — PROCHLORPERAZINE MALEATE 5 MG PO TABS: 5.0000 mg | ORAL_TABLET | Freq: Four times a day (QID) | ORAL | Status: DC | PRN

## 2020-06-28 NOTE — Progress Notes (Signed)
Inpatient Rehabilitation Medication Review by a Pharmacist  A complete drug regimen review was completed for this patient to identify any potential clinically significant medication issues.  Clinically significant medication issues were identified:  yes   Type of Medication Issue Identified Description of Issue Urgent (address now) Non-Urgent (address on AM team rounds) Plan Plan Accepted by Provider? (Yes / No / Pending AM Rounds)  Drug Interaction(s) (clinically significant)       Duplicate Therapy       Allergy       No Medication Administration End Date       Incorrect Dose       Additional Drug Therapy Needed  Multivitamin not reordered, thiamine dose change    Follow up at CIR rounds if anything needs to be done   Other         Name of provider notified for urgent issues identified:   Provider Method of Notification:    For non-urgent medication issues to be resolved on team rounds tomorrow morning a [employer] [employer] was sent to:    Pharmacist comments: Possibly insignificant but may be a dosing error or omission.   Time spent performing this drug regimen review (minutes):  15   Georgiann Neider 06/28/2020 5:00 PM

## 2020-06-28 NOTE — Discharge Summary (Signed)
Physician Discharge Summary  Jacqueline Bonilla HYW:[id number] DOB: 1955-09-28 DOA: 06/24/2020  PCP: Horald Pollen, MD  Admit date: 06/24/2020 Discharge date: 06/28/2020  Admitted From: Home Disposition: CIR  Recommendations for Outpatient Follow-up:  Patient will be discharged to CIR for further management Follow-up with primary neurologist, [name] after discharge  Discharge Condition: Stable CODE STATUS: Full code Diet recommendation: Heart healthy  Brief/Interim Summary: Jacqueline Bonilla is a 65 y.o. female with medical history significant of past CVA, vascular dementia, HTN, thrombocytopenia and ILR who presented for left sided weakness and facial droop.  She was at gym exercising on the elliptical and when she went to get off she fell to the floor.  She had sudden left sided weakness and left facial droop. She believes this happened around 10AM. EMS was called. She denies any slurred speech or word finding difficulties and states she does not recall any left arm weakness, but this was reported by EMS.  She can not tell me if her symptoms have improved. She states she tried to stand up and go to the bathroom and was having to lean very heavily on aide so they put her back in bed. She denies any headaches, vision changes, chest pain, palpitations, fever, abdominal pain, N/V/D. She denies any loss of sensation. She is right handed  Discharge Diagnoses:  Principal Problem:   Acute left-sided weakness Active Problems:   Essential hypertension   Cerebrovascular accident [facility])   Vascular dementia without behavioral disturbance ([place])   Cerebral infarction ([place])  Acute CVA with left-sided weakness Stroke work up initiated and neurology on board. She declined tpa in ER -MRI/MRA completed -echo completed, no acute findings -carotid dopplers 1-39% bilat -a1c 5.8 -lipid panel LDL 108, above goal, c/w new statin -PT/OT/ST to eval-CIR -She was treated with aspirin and Plavix.   Will need to be on dual antiplatelet therapy for the next 3 weeks and then Plavix alone -Follow-up with neurology after discharge   Active Problems:   Essential hypertension -Blood pressure stable on ARB     Cerebrovascular accident [facility]) Past hx of CVA. Per neurology recommendation continue ASA and plavix. Already has neurologist outpatient.      Vascular dementia without behavioral disturbance ([facility]) -appears to be at baseline. Continue f/u with outpatient neurologist.  Discharge Instructions  Discharge Instructions     Ambulatory referral to Neurology   Complete by: As directed    Follow up with [name] in about 4 weeks. Pt is [name] pt. Thanks.   Diet - low sodium heart healthy   Complete by: As directed    Increase activity slowly   Complete by: As directed       Allergies as of 06/28/2020       Reactions   Avelox [moxifloxacin]    Ciprofloxacin Hives   Floraquin [iodoquinol]    No cipro or others   Iohexol     Code: HIVES, Desc: HIVES S/P IVP MANY YRS AGO...NO PROBLEM W/O PREP TODAY/A.[place]HOUN, Onset Date: [id number]   Levaquin [levofloxacin]    Quinolones    Shellfish Allergy Itching   Itching under the chin. Described by patient but not seen by husband.        Medication List     TAKE these medications    aspirin EC 81 MG tablet Take 1 tablet (81 mg total) by mouth daily. Swallow whole. What changed:  how much to take when to take this   atorvastatin 40 MG tablet Commonly known as:  LIPITOR Take 1 tablet (40 mg total) by mouth daily. Start taking on: June 29, 2020   clopidogrel 75 MG tablet Commonly known as: PLAVIX Take 1 tablet (75 mg total) by mouth daily. Start taking on: June 29, 2020   losartan 25 MG tablet Commonly known as: Cozaar Take 1 tablet (25 mg total) by mouth daily. What changed: when to take this   VITAMIN B-1 PO Take 1 tablet by mouth daily.   VITAMIN C PO Take 1 tablet by mouth daily.   VITAMIN D3 GUMMIES  PO Take 1,000 mcg by mouth daily.   ZINC PO Take 1 tablet by mouth daily.        Follow-up Information     [name], MD. Schedule an appointment as soon as possible for a visit in 1 month(s).   Specialty: Neurology Contact information: [address] [name] [address] [phone]                Allergies  Allergen Reactions   Avelox [Moxifloxacin]    Ciprofloxacin Hives   Floraquin [Iodoquinol]     No cipro or others   Iohexol      Code: HIVES, Desc: HIVES S/P IVP MANY YRS AGO...NO PROBLEM W/O PREP TODAY/A.[place], Onset Date: [id number]    Levaquin [Levofloxacin]    Quinolones    Shellfish Allergy Itching    Itching under the chin. Described by patient but not seen by husband.    Consultations: Neurology   Procedures/Studies: MR ANGIO HEAD WO CONTRAST  Result Date: 06/24/2020 CLINICAL DATA:  Neuro deficit, acute stroke suspected EXAM: MRI HEAD WITHOUT CONTRAST MRA HEAD WITHOUT CONTRAST TECHNIQUE: Multiplanar, multi-echo pulse sequences of the brain and surrounding structures were acquired without intravenous contrast. Angiographic images of the Circle of Willis were acquired using MRA technique without intravenous contrast. COMPARISON:  And a CT code stroke. MRI head June 23, 2019. CTA June 30 2019. FINDINGS: MRI HEAD FINDINGS Brain: Punctate focus of restricted diffusion in the high left frontal cortex (series 3, image 38). No significant edema or mass effect. Additional punctate areas of faint DWI signal in bilateral precentral gyri (series 3, images 37 and 39). Redemonstrated multiple remote cortical infarcts in bilateral frontal parietal and occipital lobes. Multiple bilateral cerebellar lacunar infarcts. Moderate scattered patchy confluent T2/FLAIR hyperintensities within the white matter most likely related to chronic microvascular ischemic disease. No hydrocephalus. Age advanced moderate cerebral atrophy with ex vacuo ventricular dilation. No  acute hemorrhage. No mass lesion or abnormal mass effect. No extra-axial fluid collection. Vascular: See below. Skull and upper cervical spine: Normal marrow signal. Sinuses/Orbits: Clear sinuses.  Unremarkable orbits. Other: No sizable mastoid effusions. MRA HEAD FINDINGS Anterior circulation: Bilateral ICAs, MCAs, and ACAs are patent without flow-limiting proximal stenosis. No aneurysm identified. Posterior circulation: The visualized intradural vertebral arteries, basilar artery and posterior cerebral arteries are patent without flow limiting proximal stenosis. No aneurysm identified. IMPRESSION: MRI: 1. Punctate acute infarct in the high left frontal cortex. Additional faint punctate areas of DWI signal in bilateral precentral gyri are suspicious for additional acute or subacute infarcts, but could potentially represent artifact. Given potential involvement of multiple vascular territories, consider a central embolic etiology. 2. 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 3. Moderate atrophy. MRA: No large vessel occlusion or proximal flow limiting stenosis. Electronically Signed   By: Margaretha Sheffield MD   On: 06/24/2020 20:15   MR BRAIN WO CONTRAST  Result Date: 06/24/2020 CLINICAL DATA:  Neuro deficit, acute stroke suspected EXAM: MRI HEAD WITHOUT CONTRAST MRA HEAD WITHOUT CONTRAST TECHNIQUE: Multiplanar, multi-echo pulse sequences of the brain and surrounding structures were acquired without intravenous contrast. Angiographic images of the Circle of Willis were acquired using MRA technique without intravenous contrast. COMPARISON:  And a CT code stroke. MRI head June 23, 2019. CTA June 30 2019. FINDINGS: MRI HEAD FINDINGS Brain: Punctate focus of restricted diffusion in the high left frontal cortex (series 3, image 38). No significant edema or mass effect. Additional punctate areas of faint DWI signal in  bilateral precentral gyri (series 3, images 37 and 39). Redemonstrated multiple remote cortical infarcts in bilateral frontal parietal and occipital lobes. Multiple bilateral cerebellar lacunar infarcts. Moderate scattered patchy confluent T2/FLAIR hyperintensities within the white matter most likely related to chronic microvascular ischemic disease. No hydrocephalus. Age advanced moderate cerebral atrophy with ex vacuo ventricular dilation. No acute hemorrhage. No mass lesion or abnormal mass effect. No extra-axial fluid collection. Vascular: See below. Skull and upper cervical spine: Normal marrow signal. Sinuses/Orbits: Clear sinuses.  Unremarkable orbits. Other: No sizable mastoid effusions. MRA HEAD FINDINGS Anterior circulation: Bilateral ICAs, MCAs, and ACAs are patent without flow-limiting proximal stenosis. No aneurysm identified. Posterior circulation: The visualized intradural vertebral arteries, basilar artery and posterior cerebral arteries are patent without flow limiting proximal stenosis. No aneurysm identified. IMPRESSION: MRI: 1. Punctate acute infarct in the high left frontal cortex. Additional faint punctate areas of DWI signal in bilateral precentral gyri are suspicious for additional acute or subacute infarcts, but could potentially represent artifact. Given potential involvement of multiple vascular territories, consider a central embolic etiology. 2. 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 3. Moderate atrophy. MRA: No large vessel occlusion or proximal flow limiting stenosis. Electronically Signed   By: Margaretha Sheffield MD   On: 06/24/2020 20:15   ECHOCARDIOGRAM COMPLETE  Result Date: 06/24/2020    ECHOCARDIOGRAM REPORT   Patient Name:   [facility]esner Date of Exam: 06/24/2020 Medical Rec #:  [id number]          Height:       64.0 in Accession #:    [id number]         Weight:       115.5  lb Date of Birth:  [birth year]          BSA:          1.549 m Patient Age:    65 years           BP:           162/86 mmHg Patient Gender: F                  HR:           69 bpm. Exam Location:  Inpatient Procedure: 2D Echo, Cardiac Doppler and Color Doppler Indications:    CVA  History:        Patient has prior history of Echocardiogram examinations.                 Stroke, Signs/Symptoms:Dementia; Risk Factors:Hypertension.  Sonographer:    Dustin Flock Referring Phys: [id number] Orma Flaming  Sonographer Comments: Image acquisition challenging due to respiratory motion. IMPRESSIONS  1. Left ventricular ejection fraction, by estimation, is 55 to 60%. The left ventricle has normal function. The left ventricle has no regional wall motion abnormalities. Left ventricular diastolic parameters are consistent  with Grade I diastolic dysfunction (impaired relaxation).  2. Right ventricular systolic function is normal. The right ventricular size is normal. There is normal pulmonary artery systolic pressure.  3. The mitral valve is normal in structure. Trivial mitral valve regurgitation. No evidence of mitral stenosis.  4. The aortic valve is tricuspid. Aortic valve regurgitation is not visualized. No aortic stenosis is present.  5. The inferior vena cava is normal in size with greater than 50% respiratory variability, suggesting right atrial pressure of 3 mmHg. FINDINGS  Left Ventricle: Left ventricular ejection fraction, by estimation, is 55 to 60%. The left ventricle has normal function. The left ventricle has no regional wall motion abnormalities. The left ventricular internal cavity size was normal in size. There is  no left ventricular hypertrophy. Left ventricular diastolic parameters are consistent with Grade I diastolic dysfunction (impaired relaxation). Right Ventricle: The right ventricular size is normal. Right ventricular systolic function is normal. There is normal pulmonary artery systolic pressure. The  tricuspid regurgitant velocity is 2.14 m/s, and with an assumed right atrial pressure of 3 mmHg,  the estimated right ventricular systolic pressure is 27.7 mmHg. Left Atrium: Left atrial size was normal in size. Right Atrium: Right atrial size was normal in size. Pericardium: There is no evidence of pericardial effusion. Mitral Valve: The mitral valve is normal in structure. Trivial mitral valve regurgitation. No evidence of mitral valve stenosis. Tricuspid Valve: The tricuspid valve is normal in structure. Tricuspid valve regurgitation is trivial. No evidence of tricuspid stenosis. Aortic Valve: The aortic valve is tricuspid. Aortic valve regurgitation is not visualized. No aortic stenosis is present. Pulmonic Valve: The pulmonic valve was not well visualized. Pulmonic valve regurgitation is trivial. No evidence of pulmonic stenosis. Aorta: The aortic root is normal in size and structure. Venous: The inferior vena cava is normal in size with greater than 50% respiratory variability, suggesting right atrial pressure of 3 mmHg. IAS/Shunts: No atrial level shunt detected by color flow Doppler.  LEFT VENTRICLE PLAX 2D LVIDd:         3.70 cm  Diastology LVIDs:         2.60 cm  LV e' medial:    5.55 cm/s LV PW:         0.90 cm  LV E/e' medial:  11.2 LV IVS:        1.00 cm  LV e' lateral:   10.60 cm/s LVOT diam:     1.80 cm  LV E/e' lateral: 5.9 LV SV:         41 LV SV Index:   26 LVOT Area:     2.54 cm  RIGHT VENTRICLE RV Basal diam:  2.50 cm RV S prime:     10.70 cm/s TAPSE (M-mode): 2.0 cm LEFT ATRIUM             Index       RIGHT ATRIUM          Index LA diam:        2.60 cm 1.68 cm/m  RA Area:     9.97 cm LA Vol (A2C):   36.1 ml 23.30 ml/m RA Volume:   23.00 ml 14.85 ml/m LA Vol (A4C):   21.8 ml 14.07 ml/m LA Biplane Vol: 29.6 ml 19.11 ml/m  AORTIC VALVE LVOT Vmax:   85.00 cm/s LVOT Vmean:  52.100 cm/s LVOT VTI:    0.160 m  AORTA Ao Root diam: 2.60 cm MITRAL VALVE  TRICUSPID VALVE MV Area (PHT):  3.12 cm    TR Peak grad:   18.3 mmHg MV Decel Time: 243 msec    TR Vmax:        214.00 cm/s MV E velocity: 62.20 cm/s MV A velocity: 72.10 cm/s  SHUNTS MV E/A ratio:  0.86        Systemic VTI:  0.16 m                            Systemic Diam: 1.80 cm Kirk Ruths MD Electronically signed by Kirk Ruths MD Signature Date/Time: 06/24/2020/2:41:59 PM    Final    CUP PACEART REMOTE DEVICE CHECK  Result Date: 06/09/2020 ILR summary report received. Battery status OK. Normal device function. No new symptom, tachy, brady, or pause episodes. No new AF episodes. Monthly summary reports and ROV/PRN Kathy Breach, RN, CCDS, [employer]  CT HEAD CODE STROKE WO CONTRAST  Result Date: 06/24/2020 CLINICAL DATA:  Code stroke. Acute neuro deficit. Left-sided weakness. EXAM: CT HEAD WITHOUT CONTRAST TECHNIQUE: Contiguous axial images were obtained from the base of the skull through the vertex without intravenous contrast. COMPARISON:  CT head 06/30/2019 FINDINGS: Brain: Mild atrophy. Mild white matter changes bilaterally. Small chronic infarct right occipital lobe unchanged from prior studies. Negative for acute infarct, hemorrhage, mass. Vascular: Negative for hyperdense vessel Skull: Negative Sinuses/Orbits: Paranasal sinuses clear.  Negative orbit Other: None ASPECTS ([place] Stroke Program Early CT Score) - Ganglionic level infarction (caudate, lentiform nuclei, internal capsule, insula, M1-M3 cortex): 7 - Supraganglionic infarction (M4-M6 cortex): 3 Total score (0-10 with 10 being normal): 10 IMPRESSION: 1. No acute abnormality 2. Atrophy and chronic ischemic change.  No intracranial hemorrhage. 3. ASPECTS is 10 4. Code stroke imaging results were communicated on 06/24/2020 at 11:17 am to provider Lorrin Goodell via text page Electronically Signed   By: Franchot Gallo M.D.   On: 06/24/2020 11:18   VAS US CAROTID (at [facility] and [facility] only)  Result Date: 06/27/2020 Carotid Arterial Duplex Study Patient Name:  LILYTH LAWYER  Sieh  Date of Exam:   06/24/2020 Medical Rec #: [id number]           Accession #:    [id number] Date of Birth: [birth year]           Patient Gender: F Patient Age:   65Y Exam Location:  [facility] Procedure:      VAS US CAROTID Referring Phys: [id number] Orma Flaming --------------------------------------------------------------------------------  Indications:       CVA and Left sided weakness and facial droop. Risk Factors:      Hypertension, no history of smoking, prior CVA. Other Factors:     Vascular dementia. Comparison Study:  CTA from 06/2919 WNL Performing Technologist: Rogelia Rohrer RVT, RDMS  Examination Guidelines: A complete evaluation includes B-mode imaging, spectral Doppler, color Doppler, and power Doppler as needed of all accessible portions of each vessel. Bilateral testing is considered an integral part of a complete examination. Limited examinations for reoccurring indications may be performed as noted.  Right Carotid Findings: +----------+--------+--------+--------+------------------+------------------+           PSV cm/sEDV cm/sStenosisPlaque DescriptionComments           +----------+--------+--------+--------+------------------+------------------+ CCA Prox  67      14                                intimal  thickening +----------+--------+--------+--------+------------------+------------------+ CCA Distal76      16                                intimal thickening +----------+--------+--------+--------+------------------+------------------+ ICA Prox  59      20                                                   +----------+--------+--------+--------+------------------+------------------+ ICA Distal70      27                                                   +----------+--------+--------+--------+------------------+------------------+ ECA       70      13                                                    +----------+--------+--------+--------+------------------+------------------+ +----------+--------+-------+----------------+-------------------+           PSV cm/sEDV cmsDescribe        Arm Pressure (mmHG) +----------+--------+-------+----------------+-------------------+ [id number]             Multiphasic, WNL                    +----------+--------+-------+----------------+-------------------+ +---------+--------+--+--------+--+---------+ VertebralPSV cm/s38EDV cm/s11Antegrade +---------+--------+--+--------+--+---------+  Left Carotid Findings: +----------+--------+--------+--------+------------------+------------------+           PSV cm/sEDV cm/sStenosisPlaque DescriptionComments           +----------+--------+--------+--------+------------------+------------------+ CCA Prox  73      13                                intimal thickening +----------+--------+--------+--------+------------------+------------------+ CCA Distal85      22                                intimal thickening +----------+--------+--------+--------+------------------+------------------+ ICA Prox  59      22                                                   +----------+--------+--------+--------+------------------+------------------+ ICA Distal63      23                                                   +----------+--------+--------+--------+------------------+------------------+ ECA       81      11                                                   +----------+--------+--------+--------+------------------+------------------+ +----------+--------+--------+----------------+-------------------+  PSV cm/sEDV cm/sDescribe        Arm Pressure (mmHG) +----------+--------+--------+----------------+-------------------+ [id number]             Multiphasic, WNL                    +----------+--------+--------+----------------+-------------------+  +---------+--------+--+--------+--+---------+ VertebralPSV cm/s46EDV cm/s11Antegrade +---------+--------+--+--------+--+---------+   Summary: Right Carotid: Velocities in the right ICA are consistent with a 1-39% stenosis. Left Carotid: Velocities in the left ICA are consistent with a 1-39% stenosis. Vertebrals:  Bilateral vertebral arteries demonstrate antegrade flow. Subclavians: Normal flow hemodynamics were seen in bilateral subclavian              arteries. *See table(s) above for measurements and observations.  Electronically signed by Antony Contras MD on 06/27/2020 at 5:35:51 PM.    Final    VAS [name] LOWER EXTREMITY VENOUS (DVT)  Result Date: 06/27/2020  Lower Venous DVT Study Patient Name:  ZENIAH BRINEY Henault  Date of Exam:   06/27/2020 Medical Rec #: [id number]           Accession #:    [id number] Date of Birth: 06/24/1955           Patient Gender: F Patient Age:   65Y Exam Location:  [facility] Procedure:      VAS [name] LOWER EXTREMITY VENOUS (DVT) Referring Phys: [id number] Rosalin Hawking --------------------------------------------------------------------------------  Indications: Stroke.  Risk Factors: None identified. Limitations: Ambient room light. Comparison Study: No prior studies. Performing Technologist: Oliver Hum RVT  Examination Guidelines: A complete evaluation includes B-mode imaging, spectral Doppler, color Doppler, and power Doppler as needed of all accessible portions of each vessel. Bilateral testing is considered an integral part of a complete examination. Limited examinations for reoccurring indications may be performed as noted. The reflux portion of the exam is performed with the patient in reverse Trendelenburg.  +---------+---------------+---------+-----------+----------+--------------+ RIGHT    CompressibilityPhasicitySpontaneityPropertiesThrombus Aging +---------+---------------+---------+-----------+----------+--------------+ CFV      Full           Yes       Yes                                 +---------+---------------+---------+-----------+----------+--------------+ SFJ      Full                                                        +---------+---------------+---------+-----------+----------+--------------+ FV Prox  Full                                                        +---------+---------------+---------+-----------+----------+--------------+ FV Mid   Full                                                        +---------+---------------+---------+-----------+----------+--------------+ FV DistalFull                                                        +---------+---------------+---------+-----------+----------+--------------+  PFV      Full                                                        +---------+---------------+---------+-----------+----------+--------------+ POP      Full           Yes      Yes                                 +---------+---------------+---------+-----------+----------+--------------+ PTV      Full                                                        +---------+---------------+---------+-----------+----------+--------------+ PERO     Full                                                        +---------+---------------+---------+-----------+----------+--------------+   +---------+---------------+---------+-----------+----------+--------------+ LEFT     CompressibilityPhasicitySpontaneityPropertiesThrombus Aging +---------+---------------+---------+-----------+----------+--------------+ CFV      Full           Yes      Yes                                 +---------+---------------+---------+-----------+----------+--------------+ SFJ      Full                                                        +---------+---------------+---------+-----------+----------+--------------+ FV Prox  Full                                                         +---------+---------------+---------+-----------+----------+--------------+ FV Mid   Full                                                        +---------+---------------+---------+-----------+----------+--------------+ FV DistalFull                                                        +---------+---------------+---------+-----------+----------+--------------+ PFV      Full                                                        +---------+---------------+---------+-----------+----------+--------------+  POP      Full           Yes      Yes                                 +---------+---------------+---------+-----------+----------+--------------+ PTV      Full                                                        +---------+---------------+---------+-----------+----------+--------------+ PERO     Full                                                        +---------+---------------+---------+-----------+----------+--------------+     Summary: RIGHT: - There is no evidence of deep vein thrombosis in the lower extremity.  - No cystic structure found in the popliteal fossa.  LEFT: - There is no evidence of deep vein thrombosis in the lower extremity.  - No cystic structure found in the popliteal fossa.  *See table(s) above for measurements and observations. Electronically signed by Ruta Hinds MD on 06/27/2020 at 7:04:52 PM.    Final       Subjective: Patient seen sitting up in the chair.  She is in good spirits.  Denies any complaints.  Discharge Exam: Vitals:   06/27/20 2329 06/28/20 0334 06/28/20 0754 06/28/20 1110  BP: 113/67 125/63 121/81 116/71  Pulse: 78 69 73 92  Resp: 19 18  20   Temp: 98 F (36.7 C) 98 F (36.7 C) 97.7 F (36.5 C) 98.1 F (36.7 C)  TempSrc: Axillary Axillary Oral Oral  SpO2: 100% 100% 100% 99%  Weight:      Height:        General: Pt is alert, awake, not in acute distress Cardiovascular: RRR, S1/S2 +, no rubs, no  gallops Respiratory: CTA bilaterally, no wheezing, no rhonchi Abdominal: Soft, NT, ND, bowel sounds + Extremities: no edema, no cyanosis    The results of significant diagnostics from this hospitalization (including imaging, microbiology, ancillary and laboratory) are listed below for reference.     Microbiology: Recent Results (from the past 240 hour(s))  SARS CORONAVIRUS 2 (TAT 6-24 HRS) Nasopharyngeal Nasopharyngeal Swab     Status: None   Collection Time: 06/24/20  2:08 PM   Specimen: Nasopharyngeal Swab  Result Value Ref Range Status   SARS Coronavirus 2 NEGATIVE NEGATIVE Final    Comment: (NOTE) SARS-CoV-2 target nucleic acids are NOT DETECTED.  The SARS-CoV-2 RNA is generally detectable in upper and lower respiratory specimens during the acute phase of infection. Negative results do not preclude SARS-CoV-2 infection, do not rule out co-infections with other pathogens, and should not be used as the sole basis for treatment or other patient management decisions. Negative results must be combined with clinical observations, patient history, and epidemiological information. The expected result is Negative.  Fact Sheet for Patients: SugarRoll.be  Fact Sheet for Healthcare Providers: https://www.woods-mathews.com/  This test is not yet approved or cleared by the [place] FDA and  has been authorized for detection and/or diagnosis of SARS-CoV-2 by FDA under an Emergency Use Authorization (EUA).  This EUA will remain  in effect (meaning this test can be used) for the duration of the COVID-19 declaration under Se ction 564(b)(1) of the Act, 21 U.S.C. section 360bbb-3(b)(1), unless the authorization is terminated or revoked sooner.  Performed at [facility], [place] [address] [place], [place] [address]      Labs: BNP (last 3 results) No results for input(s): BNP in the last 8760 hours. Basic Metabolic Panel: Recent Labs   Lab 06/24/20 1100 06/24/20 1106  NA 138 139  K 4.2 4.1  CL 103 102  CO2 28  --   GLUCOSE 97 95  BUN 17 19  CREATININE 1.05* 1.10*  CALCIUM 9.4  --    Liver Function Tests: Recent Labs  Lab 06/24/20 1100  AST 26  ALT 19  ALKPHOS 62  BILITOT 0.7  PROT 7.4  ALBUMIN 3.7   No results for input(s): LIPASE, AMYLASE in the last 168 hours. No results for input(s): AMMONIA in the last 168 hours. CBC: Recent Labs  Lab 06/24/20 1100 06/24/20 1106  WBC 6.3  --   NEUTROABS 4.5  --   HGB 13.1 14.3  HCT 40.4 42.0  MCV 92.2  --   PLT 214  --    Cardiac Enzymes: No results for input(s): CKTOTAL, CKMB, CKMBINDEX, TROPONINI in the last 168 hours. BNP: Invalid input(s): POCBNP CBG: Recent Labs  Lab 06/24/20 1103  GLUCAP 80   D-Dimer No results for input(s): DDIMER in the last 72 hours. Hgb A1c No results for input(s): HGBA1C in the last 72 hours. Lipid Profile No results for input(s): CHOL, HDL, LDLCALC, TRIG, CHOLHDL, LDLDIRECT in the last 72 hours. Thyroid function studies No results for input(s): TSH, T4TOTAL, T3FREE, THYROIDAB in the last 72 hours.  Invalid input(s): FREET3 Anemia work up No results for input(s): VITAMINB12, FOLATE, FERRITIN, TIBC, IRON, RETICCTPCT in the last 72 hours. Urinalysis    Component Value Date/Time   APPEARANCEUR Clear 08/08/2017 0915   GLUCOSEU Negative 08/08/2017 0915   BILIRUBINUR negative 07/20/2019 0939   BILIRUBINUR Negative 08/08/2017 0915   KETONESUR negative 07/20/2019 0939   PROTEINUR negative 07/20/2019 0939   PROTEINUR Negative 08/08/2017 0915   UROBILINOGEN 0.2 07/20/2019 0939   NITRITE Negative 07/20/2019 0939   NITRITE Negative 08/08/2017 0915   LEUKOCYTESUR Small (1+) (A) 07/20/2019 0939   LEUKOCYTESUR Negative 08/08/2017 0915   Sepsis Labs Invalid input(s): PROCALCITONIN,  WBC,  LACTICIDVEN Microbiology Recent Results (from the past 240 hour(s))  SARS CORONAVIRUS 2 (TAT 6-24 HRS) Nasopharyngeal Nasopharyngeal  Swab     Status: None   Collection Time: 06/24/20  2:08 PM   Specimen: Nasopharyngeal Swab  Result Value Ref Range Status   SARS Coronavirus 2 NEGATIVE NEGATIVE Final    Comment: (NOTE) SARS-CoV-2 target nucleic acids are NOT DETECTED.  The SARS-CoV-2 RNA is generally detectable in upper and lower respiratory specimens during the acute phase of infection. Negative results do not preclude SARS-CoV-2 infection, do not rule out co-infections with other pathogens, and should not be used as the sole basis for treatment or other patient management decisions. Negative results must be combined with clinical observations, patient history, and epidemiological information. The expected result is Negative.  Fact Sheet for Patients: SugarRoll.be  Fact Sheet for Healthcare Providers: https://www.woods-mathews.com/  This test is not yet approved or cleared by the [place] FDA and  has been authorized for detection and/or diagnosis of SARS-CoV-2 by FDA under an Emergency Use Authorization (EUA). This EUA will remain  in effect (meaning this test can be used) for the duration of the COVID-19 declaration under Se ction 564(b)(1) of the Act, 21 U.S.C. section 360bbb-3(b)(1), unless the authorization is terminated or revoked sooner.  Performed at [facility], [place] [address], [place], [place] [address]      Time coordinating discharge: 25mins  SIGNED:   Kathie Dike, MD  [facility] 06/28/2020, 1:29 PM   If 7PM-7AM, please contact night-coverage [email or web address]

## 2020-06-28 NOTE — Plan of Care (Signed)
  Problem: Education: Goal: Knowledge of disease or condition will improve Outcome: Adequate for Discharge Goal: Knowledge of secondary prevention will improve Outcome: Adequate for Discharge Goal: Knowledge of patient specific risk factors addressed and post discharge goals established will improve Outcome: Adequate for Discharge   Problem: Ischemic Stroke/TIA Tissue Perfusion: Goal: Complications of ischemic stroke/TIA will be minimized Outcome: Adequate for Discharge   Problem: Education: Goal: Knowledge of General Education information will improve Description: Including pain rating scale, medication(s)/side effects and non-pharmacologic comfort measures Outcome: Adequate for Discharge   Problem: Health Behavior/Discharge Planning: Goal: Ability to manage health-related needs will improve Outcome: Adequate for Discharge   Problem: Clinical Measurements: Goal: Ability to maintain clinical measurements within normal limits will improve Outcome: Adequate for Discharge Goal: Will remain free from infection Outcome: Adequate for Discharge Goal: Diagnostic test results will improve Outcome: Adequate for Discharge Goal: Respiratory complications will improve Outcome: Adequate for Discharge Goal: Cardiovascular complication will be avoided Outcome: Adequate for Discharge   Problem: Activity: Goal: Risk for activity intolerance will decrease Outcome: Adequate for Discharge   Problem: Nutrition: Goal: Adequate nutrition will be maintained Outcome: Adequate for Discharge   Problem: Coping: Goal: Level of anxiety will decrease Outcome: Adequate for Discharge   Problem: Elimination: Goal: Will not experience complications related to bowel motility Outcome: Adequate for Discharge Goal: Will not experience complications related to urinary retention Outcome: Adequate for Discharge   Problem: Pain Managment: Goal: General experience of comfort will improve Outcome: Adequate  for Discharge   Problem: Safety: Goal: Ability to remain free from injury will improve Outcome: Adequate for Discharge   Problem: Skin Integrity: Goal: Risk for impaired skin integrity will decrease Outcome: Adequate for Discharge

## 2020-06-28 NOTE — Care Management Important Message (Signed)
Important Message  Patient Details  Name: Jacqueline Bonilla MRN: [id number] Date of Birth: [birth year]   Medicare Important Message Given:  Yes - Important Message mailed due to current National Emergency  Verbal consent obtained due to current National Emergency  Relationship to patient: Self Contact Name: [name] Call Date: 06/28/20  Time: 1237 Phone: [phone] Outcome: No Answer/Busy Important Message mailed to: Patient address on file    Delorse Lek 06/28/2020, 12:37 PM

## 2020-06-28 NOTE — Progress Notes (Signed)
Patient ID: Jacqueline Bonilla, female   DOB: 01-19-1955, 65 y.o.   MRN: [id number] Met with the patient and spouse to review role of the nurse CM and initiate education. Discussed secondary stroke risk including previous stroke, HTN, HLD, prediabetes and COVID hx. Patient was taking ASA BID and BP med PTA. Spouse manages diet/meals and noted low gluten, heart healthy diet consumed PTA. Spouse makes smoothies with whey and patient eats fruit all three meals/day with avacados. Given a review of rehab, therapy schedule and plan of care. Patient and spouse given information on management of HTN, low salt/HH diet and review of medications. Continue to follow along to discharge to address educational needs and collaborate with the SW to facilitate preparation for discharge. Margarito Liner, RN

## 2020-06-28 NOTE — H&P (Signed)
Physical Medicine and Rehabilitation Admission H&P        Chief Complaint  Patient presents with   Stroke with functional deficits.       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 She has waxing and waning of symptoms per EMS and CT head negative and patien tiwth mild left aided weakness and ataxia. She decline tPA. MRI/MRA brain done revealing punctate infarct in high left frontal coretex and faint areas of DWI signal in bilateral precentral gyri question acute/subacute infarct or artifact. Incidental note of multiple remote cortical infarcts in cerebral and cerebellar lacunar infarcts. Stroke felt to be embolic and loop interrogated but negative for A fib. Dr. Erlinda Hong recommended ASA/Plavix X 3 weeks followed by ASA alone. Carotid dopplers negative for significant ICA stenosis. BLE dopplers negative for DVT. Patient with residual mild cognitive deficits with moderate word finding deficits, left sided weakness with apraxia and needs extra time for processing. CIR recommended due to functional decline.    Pt reports just got back sensation in RUE/ however hasn't lost any sensation in L side with this new stroke. Denies pain. LBM this AM- is very regular. Notes still has trouble with writing from previus stroke  with R hand. Was warm, sp took off socks, but now feet cold.      Review of Systems Constitutional:  Negative for chills and fever. HENT:  Negative for ear pain and hearing loss.   Eyes:  Negative for blurred vision and double vision. Respiratory:  Positive for cough. Negative for shortness of breath.   Cardiovascular:  Negative for chest pain and  palpitations. Gastrointestinal:  Negative for constipation. Genitourinary:  Negative for dysuria, frequency and urgency. Musculoskeletal:  Positive for back pain (numbness due to back injury--can hurt with palpation/stretching.). Negative for myalgias and neck pain. Skin:  Negative for itching and rash. Neurological:  Positive for sensory change (? left foot.) and weakness. Negative for dizziness and headaches. Psychiatric/Behavioral:  Positive for memory loss.   All other systems reviewed and are negative.         Past Medical History:  Diagnosis Date   Anemia     Anxiety     Depression     Dysmenorrhea     Endometriosis     Fibroid     Hypertension     Microhematuria      negative workup   Osteoporosis     Tachycardia     Thrombocytopenia [facility])             Past Surgical History:  Procedure Laterality Date   BREAST BIOPSY       CESAREAN SECTION       hysteroscopic resection       implantable loop recorder implant   10/21/2019    Medtronic Reveal Linq model LNQ 22 ([place] [id number]) implantable loop recorder           Family History  Problem Relation Age of Onset   Cancer Father          non hodgkin lymphoma & skin   Heart attack Maternal Grandfather     Dementia Mother     Polymyositis Sister  Social History: Married. Independent without AD and goes to the gym 7 days a week for about 30 minutes. She is a retired Optometrist, She does not use tobacco, smokeless tobacco, ETOH or illicit drugs.           Allergies  Allergen Reactions   Avelox [Moxifloxacin]     Ciprofloxacin Hives   Floraquin [Iodoquinol]        No cipro or others   Iohexol         Code: HIVES, Desc: HIVES S/P IVP MANY YRS AGO...NO PROBLEM W/O PREP TODAY/A.[place], Onset Date: [id number]     Levaquin [Levofloxacin]     Quinolones     Shellfish Allergy Itching      Itching under the chin. Described by patient but not seen by husband.            Medications Prior to Admission   Medication Sig Dispense Refill   Ascorbic Acid (VITAMIN C PO) Take 1 tablet by mouth daily.       aspirin EC 81 MG tablet Take 1 tablet (81 mg total) by mouth daily. Swallow whole. 30 tablet 11   [START ON 06/29/2020] atorvastatin (LIPITOR) 40 MG tablet Take 1 tablet (40 mg total) by mouth daily.       Cholecalciferol (VITAMIN D3 GUMMIES PO) Take 1,000 mcg by mouth daily.       [START ON 06/29/2020] clopidogrel (PLAVIX) 75 MG tablet Take 1 tablet (75 mg total) by mouth daily.       losartan (COZAAR) 25 MG tablet Take 1 tablet (25 mg total) by mouth daily. (Patient taking differently: Take 25 mg by mouth daily in the afternoon.) 90 tablet 3   Multiple Vitamins-Minerals (ZINC PO) Take 1 tablet by mouth daily.       Thiamine HCl (VITAMIN B-1 PO) Take 1 tablet by mouth daily.          Drug Regimen Review  Drug regimen was reviewed and remains appropriate with no significant issues identified   Home: Home Living Family/patient expects to be discharged to:: Other (Comment) (CIR) Living Arrangements: Spouse/significant other Available Help at Discharge: Family, Available 24 hours/day Type of Home: House Home Access: Stairs to enter [employer] of Steps: 4 Entrance Stairs-Rails: None Home Layout: One level Bathroom Shower/Tub: Multimedia programmer: Standard Bathroom Accessibility: No Home Equipment: Kasandra Knudsen - single point  Lives With: Spouse Psychologist, counselling)   Functional History: Prior Function Level of Independence: Independent with assistive device(s) Comments: Spouse completes all meal prep, community mobility, IADL, bill payment and med management.  She walked without an AD, and was able to perfrom her own self care. Active at the gym   Functional Status:  Mobility: Bed Mobility Overal bed mobility: Needs Assistance Bed Mobility: Supine to Sit Supine to sit: Min assist General bed mobility comments: pt in recliner upon arrival Transfers Overall transfer level: Needs  assistance Equipment used: 2 person hand held assist Transfers: Sit to/from Stand Sit to Stand: Mod assist, +2 physical assistance, +2 safety/equipment General transfer comment: Cues for sequencing to take larger steps with fatigue. Ambulation/Gait Ambulation/Gait assistance: +2 physical assistance, Mod assist Gait Distance (Feet): 20 Feet Assistive device: 2 person hand held assist Gait Pattern/deviations: Step-to pattern, Step-through pattern, Narrow base of support, Ataxic, Decreased weight shift to left General Gait Details: bilat assist to maintain posture/balance. Cues for sequencing and step length LLE. Assist with lateral weight shifting. Decreased proprioception LLE. Gait velocity: decreased Gait velocity interpretation: <1.8 ft/sec, indicate of risk for  recurrent falls   ADL: ADL Overall ADL's : Needs assistance/impaired Eating/Feeding: Set up, Sitting Grooming: Wash/dry hands, Supervision/safety, Sitting Upper Body Dressing : Minimal assistance, Sitting Lower Body Dressing: Moderate assistance, Sitting/lateral leans Lower Body Dressing Details (indicate cue type and reason): Pt requiring mod A to don sock due to inability to place sock over toes. Toilet Transfer: Moderate assistance, Ambulation, +2 for physical assistance, +2 for safety/equipment Toileting- Clothing Manipulation and Hygiene: Min guard, Sitting/lateral lean Functional mobility during ADLs: Moderate assistance, +2 for physical assistance, +2 for safety/equipment General ADL Comments: Pt perfroming functional moblity with mod A +2 hand held assist.   Cognition: Cognition Overall Cognitive Status: History of cognitive impairments - at baseline Arousal/Alertness: Awake/alert Orientation Level: Oriented X4 Attention: Focused, Sustained Focused Attention: Appears intact Sustained Attention: Appears intact Memory: Impaired Memory Impairment: Retrieval deficit Problem Solving: Appears intact Executive  Function: Reasoning Reasoning: Appears intact Cognition Arousal/Alertness: Awake/alert Behavior During Therapy: WFL for tasks assessed/performed Overall Cognitive Status: History of cognitive impairments - at baseline General Comments: decreased memory, increased time to follow commands.     Blood pressure 116/71, pulse 92, temperature 98.1 F (36.7 C), temperature source Oral, resp. rate 20, height 5\' 4"  (1.626 m), weight 52.4 kg, last menstrual period 01/02/2003, SpO2 99 %. Physical Exam Vitals and nursing note reviewed. Exam conducted with a chaperone present. Constitutional:      Appearance: Normal appearance.    Comments: Awake, alert, appropriate, sitting up in bedside chair, PA in room, NAD- sing song voice  HENT:    Head: Normocephalic and atraumatic.    Comments: Very mild R facial droop from previous stroke; tongue midline    Right Ear: External ear normal.    Left Ear: External ear normal.    Nose: Nose normal. No congestion.    Mouth/Throat:    Mouth: Mucous membranes are moist.    Pharynx: Oropharynx is clear. No oropharyngeal exudate. Eyes:    General:        Right eye: No discharge.        Left eye: No discharge.    Extraocular Movements: Extraocular movements intact.    Comments: Mild R lid lag (pt says chronic- as well as pupil on R very slightly smaller than L)  Cardiovascular:    Rate and Rhythm: Normal rate and regular rhythm.    Heart sounds: Normal heart sounds. No murmur heard.   No gallop. Pulmonary:    Comments: CTA B/L- no W/R/R- good air movement     Abdominal:    Comments: Soft, NT, ND, (+)BS - hypoactive  Musculoskeletal:    Cervical back: Normal range of motion and neck supple.    Comments: RUE- 4+/5 in deltoid, biceps, triceps; WE 5-/5, and grip and FA 4/5 LUE- 5-/5 in all muscles RLE- 5/5 in all muscles tested LLE- HF 5-/5, KE/KF 4+/5, DF 1/5, PF 2/5, EHL 2-/5   Skin:    General: Skin is warm and dry.    Comments: Bilateral feet with  mild dependent cyanosis--cool/cold to touch.   Is blanchable B/L No bogginess or wounds on heels B/L Multiple scars on lumbar spine- mainly on L side- small swelling above scars   Neurological:    Mental Status: She is alert and oriented to person, place, and time.    Comments: Able to answer orientation and biographic questions without difficulty. Speech clear. Able to follow basic commands without difficulty.   Psychiatric:    Comments: Sing song voice - appropriate, however a little flat  Lab Results Last 48 Hours  No results found for this or any previous visit (from the past 48 hour(s)).    Imaging Results (Last 48 hours)  VAS [name] LOWER EXTREMITY VENOUS (DVT)   Result Date: 06/27/2020  Lower Venous DVT Study Patient Name:  VARETTA CHAVERS Colgan  Date of Exam:   06/27/2020 Medical Rec #: [id number]           Accession #:    [id number] Date of Birth: 1955/06/15           Patient Gender: F Patient Age:   65Y Exam Location:  [facility] Procedure:      VAS [name] LOWER EXTREMITY VENOUS (DVT) Referring Phys: [id number] Rosalin Hawking --------------------------------------------------------------------------------  Indications: Stroke.  Risk Factors: None identified. Limitations: Ambient room light. Comparison Study: No prior studies. Performing Technologist: Oliver Hum RVT  Examination Guidelines: A complete evaluation includes B-mode imaging, spectral Doppler, color Doppler, and power Doppler as needed of all accessible portions of each vessel. Bilateral testing is considered an integral part of a complete examination. Limited examinations for reoccurring indications may be performed as noted. The reflux portion of the exam is performed with the patient in reverse Trendelenburg.  +---------+---------------+---------+-----------+----------+--------------+ RIGHT    CompressibilityPhasicitySpontaneityPropertiesThrombus Aging  +---------+---------------+---------+-----------+----------+--------------+ CFV      Full           Yes      Yes                                 +---------+---------------+---------+-----------+----------+--------------+ SFJ      Full                                                        +---------+---------------+---------+-----------+----------+--------------+ FV Prox  Full                                                        +---------+---------------+---------+-----------+----------+--------------+ FV Mid   Full                                                        +---------+---------------+---------+-----------+----------+--------------+ FV DistalFull                                                        +---------+---------------+---------+-----------+----------+--------------+ PFV      Full                                                        +---------+---------------+---------+-----------+----------+--------------+ POP      Full           Yes  Yes                                 +---------+---------------+---------+-----------+----------+--------------+ PTV      Full                                                        +---------+---------------+---------+-----------+----------+--------------+ PERO     Full                                                        +---------+---------------+---------+-----------+----------+--------------+   +---------+---------------+---------+-----------+----------+--------------+ LEFT     CompressibilityPhasicitySpontaneityPropertiesThrombus Aging +---------+---------------+---------+-----------+----------+--------------+ CFV      Full           Yes      Yes                                 +---------+---------------+---------+-----------+----------+--------------+ SFJ      Full                                                         +---------+---------------+---------+-----------+----------+--------------+ FV Prox  Full                                                        +---------+---------------+---------+-----------+----------+--------------+ FV Mid   Full                                                        +---------+---------------+---------+-----------+----------+--------------+ FV DistalFull                                                        +---------+---------------+---------+-----------+----------+--------------+ PFV      Full                                                        +---------+---------------+---------+-----------+----------+--------------+ POP      Full           Yes      Yes                                 +---------+---------------+---------+-----------+----------+--------------+ PTV      Full                                                        +---------+---------------+---------+-----------+----------+--------------+  PERO     Full                                                        +---------+---------------+---------+-----------+----------+--------------+     Summary: RIGHT: - There is no evidence of deep vein thrombosis in the lower extremity.  - No cystic structure found in the popliteal fossa.  LEFT: - There is no evidence of deep vein thrombosis in the lower extremity.  - No cystic structure found in the popliteal fossa.  *See table(s) above for measurements and observations. Electronically signed by Ruta Hinds MD on 06/27/2020 at 7:04:52 PM.    Final              Medical Problem List and Plan: 1.  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  Antithrombotics: -DVT/anticoagulation:  Pharmaceutical: Lovenox             -antiplatelet therapy: DAPT X 3 weeks followed by ASA alone.  3.  Pain Management: N/A 4. Mood: LCSW to follow for evaluation and support.              -antipsychotic agents: N/a 5. Neuropsych: This patient may not be fully capable of making decisions on her own behalf. 6. Skin/Wound Care: Routine pressure relief measures. 7. Fluids/Electrolytes/Nutrition: Monitor I/O. Check lytes in am. 8. HTN: Monitor BP tid--continue Cozaar. 9. H/o thrombocytopenia: Recheck CBC in am as now on DAPT 11. Dyslipidemia: Continue Lipitor.          Bary Leriche, PA-C 06/28/2020  I have personally performed a face to face diagnostic evaluation of this patient and formulated the key components of the plan.  Additionally, I have personally reviewed laboratory data, imaging studies, as well as relevant notes and concur with the physician assistant's documentation above.   The patient's status has not changed from the original H&P.  Any changes in documentation from the acute care chart have been noted above.

## 2020-06-28 NOTE — TOC Transition Note (Signed)
Transition of Care [facility]) - CM/SW Discharge Note   Patient Details  Name: Jacqueline Bonilla MRN: [id number] Date of Birth: [birth year]  [facility] CM/SW Contact:  Pollie Friar, RN Phone Number: 06/28/2020, 10:58 AM   Clinical Narrative:    Patient is discharging to CIR today. CM signing off.   Final next level of care: IP Rehab Facility Barriers to Discharge: No Barriers Identified   Patient Goals and CMS Choice        Discharge Placement                       Discharge Plan and Services                                     Social Determinants of Health (SDOH) Interventions     Readmission Risk Interventions No flowsheet data found.

## 2020-06-28 NOTE — Progress Notes (Signed)
Patient admitted to CIR this afternoon at 1555. No complaints of pain  at this time, call light and personal belongings within reach. Angie Fava

## 2020-06-28 NOTE — Progress Notes (Signed)
PMR Admission Coordinator Pre-Admission Assessment   Patient: Jacqueline Bonilla is an 65 y.o., female MRN: [id number] DOB: 1955-11-30 Height: 5' 4" (162.6 cm) Weight: 52.4 kg   Insurance Information HMO: yes    PPO:      PCP:      IPA:      80/20:      OTHER: PRIMARY: [employer]      Policy#: [id number]      Subscriber: Pt. CM Name: Wyona Almas      Phone#: [phone] ext [phone]     Fax#: [phone] Pre-Cert#: [id number]      Employer: Benefits:  Phone #: opened via [email or web address]     Name: Eff. Date:Eff Date: 06/01/2020- still active Deductible: does not have one OOP Max: $3,900 ($0 met) CIR: $295/day co-pay with a max co-pay of $1,770/admission (6 days) SNF: $0/day co-pay for days 1-20, $178/day co-pay for days 21-100, limited to 100 days/cal yr Outpatient:  $10-$20/visit co-pay Home Health:  100% coverage   SECONDARY: none   The "Data Collection Information Summary" for patients in Inpatient Rehabilitation Facilities with attached "Privacy Act [place] Records" was provided and verbally reviewed with: Patient   Emergency Contact Information Contact Information       Name Relation Home Work Mobile    Grillot,[name] Spouse     [phone]           Current Medical History  Patient Admitting Diagnosis: CVA History of Present Illness: Jacqueline Bonilla is a 65 y.o. female with medical history significant of past CVA, vascular dementia, HTN, thrombocytopenia and ILR who presented to the ED 6/24/2 for left sided weakness and facial droop.  She was at gym exercising on the elliptical and when she went to get off she fell to the floor.  She had sudden left sided weakness and left facial droop. She believes this happened around 10AM. EMS was called. She denies any slurred speech or word finding difficulties and states she does not recall any left arm weakness, but this was reported by EMS.  MRA showed no large vessel occlusion or proximal flow limiting  stenosis. MRI was revealing for punctate acute infarct in the high left frontal cortex with additional faint punctate areas of DWI signal in bilateral precentral gyri are suspicious for additional acute or subacute infarcts, and multiple cortical infarcts in bilateral frontal, parietal, and occipital lobes and multiple bilateral cerebellar lacunar infarcts. CIR was consulted to assist in return to PLOF.   Complete NIHSS TOTAL: 0   Patient's medical record from [facility] has been reviewed by the rehabilitation admission coordinator and physician.   Past Medical History      Past Medical History:  Diagnosis Date   Anemia     Anxiety     Depression     Dysmenorrhea     Endometriosis     Fibroid     Hypertension     Microhematuria      negative workup   Osteoporosis     Tachycardia     Thrombocytopenia (HCC)        Family History   family history includes Cancer in her father; Dementia in her mother; Heart attack in her maternal grandfather; Polymyositis in her sister.   Prior Rehab/Hospitalizations Has the patient had prior rehab or hospitalizations prior to admission? No   Has the patient had major surgery during 100 days prior to admission? No  Current Medications   Current Facility-Administered Medications:   acetaminophen (TYLENOL) tablet 650 mg, 650 mg, Oral, Q4H PRN **OR** acetaminophen (TYLENOL) 160 MG/5ML solution 650 mg, 650 mg, Per Tube, Q4H PRN **OR** acetaminophen (TYLENOL) suppository 650 mg, 650 mg, Rectal, Q4H PRN, Orma Flaming, MD   ascorbic acid (VITAMIN C) tablet 250 mg, 250 mg, Oral, Daily, Orma Flaming, MD, 250 mg at 06/25/20 3329   [COMPLETED] aspirin chewable tablet 162 mg, 162 mg, Oral, Once, 162 mg at 06/24/20 1121 **FOLLOWED BY** aspirin chewable tablet 81 mg, 81 mg, Oral, Daily, Donnetta Simpers, MD, 81 mg at 06/25/20 0934   atorvastatin (LIPITOR) tablet 40 mg, 40 mg, Oral, Daily, Metzger-Cihelka, Desiree, NP, 40  mg at 06/25/20 5188   cholecalciferol (VITAMIN D3) tablet 1,000 Units, 1,000 Units, Oral, Daily, Orma Flaming, MD, 1,000 Units at 06/25/20 0934   [COMPLETED] clopidogrel (PLAVIX) tablet 300 mg, 300 mg, Oral, Once, 300 mg at 06/24/20 1120 **FOLLOWED BY** clopidogrel (PLAVIX) tablet 75 mg, 75 mg, Oral, Daily, Donnetta Simpers, MD, 75 mg at 06/25/20 0934   enoxaparin (LOVENOX) injection 40 mg, 40 mg, Subcutaneous, Q24H, Orma Flaming, MD, 40 mg at 06/25/20 1543   losartan (COZAAR) tablet 25 mg, 25 mg, Oral, Q1500, Orma Flaming, MD, 25 mg at 06/25/20 1542   multivitamin with minerals tablet 1 tablet, 1 tablet, Oral, Daily, Orma Flaming, MD, 1 tablet at 06/25/20 4166   senna-docusate (Senokot-S) tablet 1 tablet, 1 tablet, Oral, QHS PRN, Orma Flaming, MD   thiamine tablet 50 mg, 50 mg, Oral, Daily, Orma Flaming, MD, 50 mg at 06/25/20 0630   Patients Current Diet:  Diet Order                  Diet Heart Room service appropriate? Yes; Fluid consistency: Thin  Diet effective now                         Precautions / Restrictions Precautions Precautions: Fall Precaution Comments: ataxic, apraxia? Restrictions Weight Bearing Restrictions: No    Has the patient had 2 or more falls or a fall with injury in the past year? No   Prior Activity Level Community (5-7x/wk): Pt. was active in the community PTA   Prior Functional Level Self Care: Did the patient need help bathing, dressing, using the toilet or eating? Independent   Indoor Mobility: Did the patient need assistance with walking from room to room (with or without device)? Independent   Stairs: Did the patient need assistance with internal or external stairs (with or without device)? Independent   Functional Cognition: Did the patient need help planning regular tasks such as shopping or remembering to take medications? Independent   Home Assistive Devices / Equipment Home Equipment: Cane - single point   Prior Device  Use: Indicate devices/aids used by the patient prior to current illness, exacerbation or injury? None of the above   Current Functional Level Cognition   Arousal/Alertness: Awake/alert Overall Cognitive Status: History of cognitive impairments - at baseline Orientation Level: Oriented X4 General Comments: difficulty with memory, processing and complex though.  does follow commands with increased time. Attention: Focused, Sustained Focused Attention: Appears intact Sustained Attention: Appears intact Memory: Impaired Memory Impairment: Retrieval deficit Problem Solving: Appears intact Executive Function: Reasoning Reasoning: Appears intact    Extremity Assessment (includes Sensation/Coordination)   Upper Extremity Assessment: Defer to OT evaluation  Lower Extremity Assessment: LLE deficits/detail LLE Deficits / Details: MMT 4+/5, functionally weak which may be due to  ataxia or apraxia? LLE Coordination: decreased gross motor, decreased fine motor     ADLs   Overall ADL's : Needs assistance/impaired Eating/Feeding: Set up, Sitting Grooming: Wash/dry hands, Supervision/safety, Sitting Upper Body Dressing : Minimal assistance, Sitting Lower Body Dressing: Minimal assistance, Sitting/lateral leans, Moderate assistance Toilet Transfer: Moderate assistance, Ambulation, +2 for physical assistance, +2 for safety/equipment Toileting- Clothing Manipulation and Hygiene: Min guard, Sitting/lateral lean Functional mobility during ADLs: Moderate assistance, +2 for physical assistance, +2 for safety/equipment     Mobility   Overal bed mobility: Needs Assistance Bed Mobility: Supine to Sit Supine to sit: Min assist General bed mobility comments: up in recliner on arrival     Transfers   Overall transfer level: Needs assistance Equipment used: None Transfers: Sit to/from Stand Sit to Stand: Mod assist General transfer comment: required momentum and multiple attempts to achieve standing  from recliner. Posteior and R lateral lean noted     Ambulation / Gait / Stairs / Wheelchair Mobility   Ambulation/Gait Ambulation/Gait assistance: Mod assist, Max assist Gait Distance (Feet): 5 Feet Assistive device: Rolling walker (2 wheeled) Gait Pattern/deviations: Step-to pattern, Decreased step length - right, Decreased step length - left, Decreased stance time - left, Decreased stride length, Decreased weight shift to left, Shuffle, Ataxic, Trunk flexed, Narrow base of support General Gait Details: Difficulty advancing L LE with ambulation however able to perform L hip flexion in static standing. Tends to slide L foot forward. Demos difficulty processing movement and coordinating (apraxia?). Mod-maxA to maintain balance with ambulation due to posterior lean and intermittent knee buckling Gait velocity: decreased     Posture / Balance Balance Overall balance assessment: Needs assistance Sitting-balance support: Feet supported Sitting balance-Leahy Scale: Poor Postural control: Posterior lean Standing balance support: Bilateral upper extremity supported, Single extremity supported Standing balance-Leahy Scale: Poor Standing balance comment: needs external support     Special needs/care consideration none    Previous Home Environment (from acute therapy documentation) Living Arrangements: Spouse/significant other  Lives With: Spouse [name]) Available Help at Discharge: Family, Available 24 hours/day Type of Home: House Home Layout: One level Home Access: Stairs to enter Entrance Stairs-Rails: None Entrance Stairs-Number of Steps: 4 Bathroom Shower/Tub: Multimedia programmer: Standard Bathroom Accessibility: No   Discharge Living Setting Plans for Discharge Living Setting: Patient's home Type of Home at Discharge: House Discharge Home Layout: One level Discharge Home Access: Stairs to enter Entrance Stairs-Rails: None Entrance Stairs-Number of Steps: 4 Discharge  Bathroom Shower/Tub: Walk-in shower Discharge Bathroom Toilet: Standard Discharge Bathroom Accessibility: No Does the patient have any problems obtaining your medications?: No   Social/Family/Support Systems Patient Roles: Spouse Contact Information: [phone] Anticipated Caregiver: Yanil Dawe Anticipated Caregiver's Contact Information: [phone] Ability/Limitations of Caregiver: Can provide supervision only Caregiver Availability: 24/7 Discharge Plan Discussed with Primary Caregiver: Yes Is Caregiver In Agreement with Plan?: Yes Does Caregiver/Family have Issues with Lodging/Transportation while Pt is in Rehab?: No   Goals Patient/Family Goal for Rehab: PT/OT Supevision; SLP Mod I Expected length of stay: 16-19 days Pt/Family Agrees to Admission and willing to participate: Yes Program Orientation Provided & Reviewed with Pt/Caregiver Including Roles  & Responsibilities: Yes   Decrease burden of Care through IP rehab admission: Specialzed equipment needs, Decrease number of caregivers, Bowel and bladder program, and Patient/family education   Possible need for SNF placement upon discharge: not anticipated   Patient Condition: I have reviewed medical records from [facility], spoken with CM, and patient and spouse. I met with patient  at the bedside and discussed via phone for inpatient rehabilitation assessment.  Patient will benefit from ongoing PT, OT, and SLP, can actively participate in 3 hours of therapy a day 5 days of the week, and can make measurable gains during the admission.  Patient will also benefit from the coordinated team approach during an Inpatient Acute Rehabilitation admission.  The patient will receive intensive therapy as well as Rehabilitation physician, nursing, social worker, and care management interventions.  Due to safety, skin/wound care, disease management, medication administration, pain management, and patient education the  patient requires 24 hour a day rehabilitation nursing.  The patient is currently Mod-Max A with mobility and basic ADLs.  Discharge setting and therapy post discharge at home with home health is anticipated.  Patient has agreed to participate in the Acute Inpatient Rehabilitation Program and will admit today.   Preadmission Screen Completed By:  Genella Mech, 06/26/2020 7:37 AM ______________________________________________________________________   Discussed status with Dr. Dagoberto Ligas on 06/28/20  at 3 and received approval for admission today.   Admission Coordinator:  Genella Mech, CCC-SLP, time 1030/Date 06/28/20    Assessment/Plan: Diagnosis: Does the need for close, 24 hr/day Medical supervision in concert with the patient's rehab needs make it unreasonable for this patient to be served in a less intensive setting? Yes Co-Morbidities requiring supervision/potential complications: HTN, prior CVA, vascular dementia, thrombocyopenia, L hemiparesis and facial droop Due to bladder management, bowel management, safety, skin/wound care, disease management, medication administration, pain management, and patient education, does the patient require 24 hr/day rehab nursing? Yes Does the patient require coordinated care of a physician, rehab nurse, PT, OT, and SLP to address physical and functional deficits in the context of the above medical diagnosis(es)? Yes Addressing deficits in the following areas: balance, endurance, locomotion, strength, transferring, bathing, dressing, feeding, grooming, toileting, cognition, speech, and language Can the patient actively participate in an intensive therapy program of at least 3 hrs of therapy 5 days a week? Yes The potential for patient to make measurable gains while on inpatient rehab is good Anticipated functional outcomes upon discharge from inpatient rehab: modified independent and supervision PT, modified independent and supervision OT, modified independent  and supervision SLP Estimated rehab length of stay to reach the above functional goals is: 16-19 days Anticipated discharge destination: Home 10. Overall Rehab/Functional Prognosis: good     MD Signature:

## 2020-06-28 NOTE — Progress Notes (Signed)
Inpatient Rehab Admissions Coordinator:   I have a CIR bed for this pt. Today and will plan to transfer her today. RN may call report to [phone].  Clemens Catholic, [place], [place] Admissions Coordinator  [phone] ([place]) [phone] (office)

## 2020-06-29 ENCOUNTER — Ambulatory Visit: Payer: Medicare HMO | Admitting: Emergency Medicine

## 2020-06-29 DIAGNOSIS — I63521 Cerebral infarction due to unspecified occlusion or stenosis of right anterior cerebral artery: Secondary | ICD-10-CM

## 2020-06-29 LAB — CBC WITH DIFFERENTIAL/PLATELET
Abs Immature Granulocytes: 0.03 10*3/uL (ref 0.00–0.07)
Basophils Absolute: 0.1 10*3/uL (ref 0.0–0.1)
Basophils Relative: 1 %
Eosinophils Absolute: 0.2 10*3/uL (ref 0.0–0.5)
Eosinophils Relative: 3 %
HCT: 35.5 % — ABNORMAL LOW (ref 36.0–46.0)
Hemoglobin: 11.6 g/dL — ABNORMAL LOW (ref 12.0–15.0)
Immature Granulocytes: 1 %
Lymphocytes Relative: 30 %
Lymphs Abs: 1.5 10*3/uL (ref 0.7–4.0)
MCH: 30 pg (ref 26.0–34.0)
MCHC: 32.7 g/dL (ref 30.0–36.0)
MCV: 91.7 fL (ref 80.0–100.0)
Monocytes Absolute: 0.4 10*3/uL (ref 0.1–1.0)
Monocytes Relative: 9 %
Neutro Abs: 2.9 10*3/uL (ref 1.7–7.7)
Neutrophils Relative %: 56 %
Platelets: 220 10*3/uL (ref 150–400)
RBC: 3.87 MIL/uL (ref 3.87–5.11)
RDW: 12.8 % (ref 11.5–15.5)
WBC: 5.1 10*3/uL (ref 4.0–10.5)
nRBC: 0 % (ref 0.0–0.2)

## 2020-06-29 LAB — COMPREHENSIVE METABOLIC PANEL
ALT: 20 U/L (ref 0–44)
AST: 24 U/L (ref 15–41)
Albumin: 3 g/dL — ABNORMAL LOW (ref 3.5–5.0)
Alkaline Phosphatase: 46 U/L (ref 38–126)
Anion gap: 4 — ABNORMAL LOW (ref 5–15)
BUN: 27 mg/dL — ABNORMAL HIGH (ref 8–23)
CO2: 29 mmol/L (ref 22–32)
Calcium: 9 mg/dL (ref 8.9–10.3)
Chloride: 104 mmol/L (ref 98–111)
Creatinine, Ser: 0.94 mg/dL (ref 0.44–1.00)
GFR, Estimated: >60 mL/min (ref >60)
Glucose, Bld: 89 mg/dL (ref 70–99)
Potassium: 3.8 mmol/L (ref 3.5–5.1)
Sodium: 137 mmol/L (ref 135–145)
Total Bilirubin: 0.5 mg/dL (ref 0.3–1.2)
Total Protein: 6.1 g/dL — ABNORMAL LOW (ref 6.5–8.1)

## 2020-06-29 NOTE — Evaluation (Addendum)
Physical Therapy Assessment and Plan  Patient Details  Name: Jacqueline Bonilla MRN: [id number] Date of Birth: [birth year]  PT Diagnosis: Abnormality of gait, Cognitive deficits, Difficulty walking, Hemiplegia non-dominant, and Muscle weakness Rehab Potential: Good ELOS: 12-15   Today's Date: 06/29/2020 PT Individual Time: 9892-1194 PT Individual Time Calculation (min): 82 min    Hospital Problem: Principal Problem:   CVA (cerebral vascular accident) [facility]) Active Problems:   Vascular dementia without behavioral disturbance ([place])   Acute left-sided weakness   Past Medical History:  Past Medical History:  Diagnosis Date   Anemia    Anxiety    Depression    Dysmenorrhea    Endometriosis    Fibroid    Hypertension    Microhematuria    negative workup   Osteoporosis    Tachycardia    Thrombocytopenia ([place])    Past Surgical History:  Past Surgical History:  Procedure Laterality Date   BREAST BIOPSY     CESAREAN SECTION     hysteroscopic resection     implantable loop recorder implant  10/21/2019   [employer]onic Reveal Cecil model LNQ 22 ([place] [id number]) implantable loop recorder    Assessment & Plan Clinical Impression: Jacqueline Bonilla. Jacqueline Bonilla is a 65 year old female with history of cryptogenic stroke with mild right hand weakness s/p loop recorder, vascular dementia with confusion, paranoia and labile mood, thrombocytopenia, HTN, micronhematuria who was admitted on 06/24/20 after falling off her treadmill at the gym with sudden onset of left sided weakness and left facial droop. She has waxing and waning of symptoms per EMS and CT head negative and patien tiwth mild left aided weakness and ataxia. She decline tPA. MRI/MRA brain done revealing punctate infarct in high left frontal coretex and faint areas of DWI signal in bilateral precentral gyri question acute/subacute infarct or artifact. Incidental note of multiple remote cortical infarcts in cerebral and cerebellar lacunar infarcts.  Stroke felt to be embolic and loop interrogated but negative for A fib. Dr. Erlinda Hong recommended ASA/Plavix X 3 weeks followed by ASA alone. Carotid dopplers negative for significant ICA stenosis. BLE dopplers negative for DVT. Patient with residual mild cognitive deficits with moderate word finding deficits, left sided weakness with apraxia and needs extra time for processing. CIR recommended due to functional decline.  Patient transferred to CIR on 06/28/2020 .   Patient currently requires min with mobility secondary to muscle weakness, decreased cardiorespiratoy endurance, impaired timing and sequencing, motor apraxia, decreased coordination, and decreased motor planning, decreased awareness, decreased problem solving, decreased safety awareness, and decreased memory, and decreased standing balance, decreased postural control, hemiplegia, and decreased balance strategies.  Prior to hospitalization, patient was independent  with mobility and lived with Spouse in a House home.  Home access is 4Stairs to enter.  Patient will benefit from skilled PT intervention to maximize safe functional mobility, minimize fall risk, and decrease caregiver burden for planned discharge home with 24 hour supervision.  Anticipate patient will benefit from follow up [place] at discharge.  PT Assessment Rehab Potential (ACUTE/IP ONLY): Good PT Barriers to Discharge: Behavior;Inaccessible home environment;Decreased caregiver support PT Patient demonstrates impairments in the following area(s): Balance;Behavior;Endurance;Motor;Safety PT Transfers Functional Problem(s): Bed to Chair;Car PT Locomotion Functional Problem(s): Ambulation;Stairs PT Plan PT Intensity: Minimum of 1-2 x/day ,45 to 90 minutes PT Frequency: Total of 15 hours over 7 days of combined therapies PT Duration Estimated Length of Stay: 12-15 PT Treatment/Interventions: Ambulation/gait training;Discharge planning;Functional mobility training;Psychosocial  support;Therapeutic Activities;Visual/perceptual remediation/compensation;Balance/vestibular training;Disease management/prevention;Neuromuscular re-education;Skin care/wound management;Therapeutic Exercise;Wheelchair propulsion/positioning;Cognitive  remediation/compensation;DME/adaptive equipment instruction;Pain management;Splinting/orthotics;UE/LE Strength taining/ROM;Community reintegration;Functional electrical stimulation;Patient/family education;Stair training;UE/LE Coordination activities PT Transfers Anticipated Outcome(s): Supervision PT Locomotion Anticipated Outcome(s): Supervision PT Recommendation Follow Up Recommendations: Home health PT Patient destination: Home Equipment Recommended: Rolling walker with 5" wheels   PT Evaluation Precautions/Restrictions Precautions Precautions: Fall Precaution Comments: Apraxia, LLE Hemiplegia Restrictions Weight Bearing Restrictions: No General Chart Reviewed: Yes Vital Signs Pain Pain Assessment Pain Scale: 0-10 Pain Score: 0-No pain Faces Pain Scale: No hurt Home Living/Prior Functioning Home Living Available Help at Discharge: Family;Friend(s);Available 24 hours/day Type of Home: House Home Access: Stairs to enter [employer] of Steps: 4 Entrance Stairs-Rails: None Home Layout: One level Bathroom Shower/Tub: Multimedia programmer: Standard Bathroom Accessibility: No  Lives With: Spouse Prior Function Level of Independence: Independent with basic ADLs;Independent with transfers;Independent with gait  Able to Take Stairs?: Yes Driving: No Vocation: Retired Comments: Pt reports spouse hands IADLs at home and driving.  Pt states she ambulated without AD Vision/Perception  Vision - Assessment Eye Alignment: Within Functional Limits Ocular Range of Motion: Within Functional Limits Alignment/Gaze Preference: Within Defined Limits Tracking/Visual Pursuits: Decreased smoothness of horizontal  tracking Convergence: Within functional limits Praxis Praxis: Impaired Praxis Impairment Details: Motor planning;Perseveration Praxis-Other Comments: Pt demonstrates impaired motor planning requiring frequent cuing.  Perseverative stepping with BLE, especially with turns and LLE when catching toe in swing  Cognition Overall Cognitive Status: History of cognitive impairments - at baseline Arousal/Alertness: Awake/alert Orientation Level: Oriented X4 Awareness: Appears intact Problem Solving: Impaired Problem Solving Impairment: Functional basic;Functional complex Behaviors: Impulsive Safety/Judgment: Impaired Comments: Pt requires frequent cuing for safety and pause during initial instructions prior to standing Sensation Sensation Light Touch: Appears Intact Proprioception: Impaired by gross assessment Coordination Gross Motor Movements are Fluid and Coordinated: No Fine Motor Movements are Fluid and Coordinated: No Finger Nose Finger Test: demonstrated dyskinetic movements L>R Heel Shin Test: Slight dyskinentic movement with LLE on shin noted Motor  Motor Motor: Hemiplegia;Abnormal postural alignment and control;Motor perseverations;Motor apraxia Motor - Skilled Clinical Observations: Left hemiplegia LLE>LUE   Trunk/Postural Assessment  Postural Control Postural Control: Deficits on evaluation  Balance Balance Balance Assessed: Yes Standardized Balance Assessment Standardized Balance Assessment: Berg Balance Test Berg Balance Test Sit to Stand: Able to stand using hands after several tries Standing Unsupported: Able to stand 2 minutes with supervision Sitting with Back Unsupported but Feet Supported on Floor or Stool: Able to sit safely and securely 2 minutes Stand to Sit: Controls descent by using hands Transfers: Needs one person to assist Standing Unsupported with Eyes Closed: Able to stand 10 seconds with supervision (increased postural sway) Standing Ubsupported with  Feet Together: Needs help to attain position but able to stand for 30 seconds with feet together From Standing, Reach Forward with Outstretched Arm: Reaches forward but needs supervision From Standing Position, Pick up Object from Floor: Unable to try/needs assist to keep balance From Standing Position, Turn to Look Behind Over each Shoulder: Needs assist to keep from losing balance and falling Turn 360 Degrees: Needs assistance while turning Standing Unsupported, Alternately Place Feet on Step/Stool: Able to complete >2 steps/needs minimal assist (UE support on handrail) Standing Unsupported, One Foot in Front: Needs help to step but can hold 15 seconds Standing on One Leg: Unable to try or needs assist to prevent fall Total Score: 20 Extremity Assessment      RLE Assessment RLE Assessment: Within Functional Limits RLE Strength RLE Overall Strength: Within Functional Limits for tasks assessed RLE Overall Strength Comments: 5/5  globally LLE Strength Left Hip Flexion: 4+/5 Left Hip ABduction: 5/5 Left Hip ADduction: 5/5 Left Knee Flexion: 5/5 Left Knee Extension: 5/5 Left Ankle Dorsiflexion: 2/5 Left Ankle Plantar Flexion: 2+/5    Care Tool Care Tool Bed Mobility Roll left and right activity Roll left and right activity did not occur: N/A (Bed Mobility not assessed at eval. Pt sitting up in chair) Roll left and right assist level: Supervision/Verbal cueing    Sit to lying activity Sit to lying activity did not occur: N/A (Bed Mobility not assessed at eval. Pt sitting up in chair) Sit to lying assist level: Contact Guard/Touching assist    Lying to sitting edge of bed activity Lying to sitting edge of bed activity did not occur: N/A (Bed Mobility not assessed at eval. Pt sitting up in chair) Lying to sitting edge of bed assist level: Contact Guard/Touching assist     Care Tool Transfers Sit to stand transfer   Sit to stand assist level: Minimal Assistance - Patient > 75%     Chair/bed transfer   Chair/bed transfer assist level: Moderate Assistance - Patient 50 - 74%     Toilet transfer   Assist Level: Moderate Assistance - Patient 50 - 74%    Car transfer   Car transfer assist level: Minimal Assistance - Patient > 75% (with RW)      Care Tool Locomotion Ambulation   Assist level: Minimal Assistance - Patient > 75% Assistive device: Walker-rolling Max distance: 30'  Walk 10 feet activity   Assist level: Minimal Assistance - Patient > 75% Assistive device: Walker-rolling   Walk 50 feet with 2 turns activity   Assist level: Minimal Assistance - Patient > 75% Assistive device: Walker-rolling  Walk 150 feet activity Walk 150 feet activity did not occur: Safety/medical concerns      Walk 10 feet on uneven surfaces activity Walk 10 feet on uneven surfaces activity did not occur: Safety/medical concerns      Stairs   Assist level: Minimal Assistance - Patient > 75% Stairs assistive device: 2 hand rails Max number of stairs: 8  Walk up/down 1 step activity Walk up/down 1 step or curb (drop down) activity did not occur: Safety/medical concerns        Walk up/down 4 steps activity Walk up/down 4 steps assist level: Minimal Assistance - Patient > 75% Walk up/down 4 steps assistive device: 2 hand rails  Walk up/down 12 steps activity Walk up/down 12 steps activity did not occur: Safety/medical concerns      Pick up small objects from floor Pick up small object from the floor (from standing position) activity did not occur: Safety/medical concerns      Wheelchair Will patient use wheelchair at discharge?: No   Wheelchair activity did not occur: N/A      Wheel 50 feet with 2 turns activity Wheelchair 50 feet with 2 turns activity did not occur: N/A    Wheel 150 feet activity Wheelchair 150 feet activity did not occur: N/A      Refer to Care Plan for Long Term Goals  SHORT TERM GOAL WEEK 1 PT Short Term Goal 1 (Week 1): Pt will perform stand  pivot transfer with CGA to progress towards goal of supervision PT Short Term Goal 2 (Week 1): Pt will ambulate 50 ft CGA to progress towards goal of supervision with RW at 130f PT Short Term Goal 3 (Week 1): Pt will consistently perform STS without cuing for body spatial awareness and controlled descent  in order to increase safety/independence with transitions to a seated position  Recommendations for other services: Therapeutic Recreation  Outing/community reintegration  Skilled Therapeutic Intervention   TUG: 40 seconds with RW and MinA (A score of >13.5 seconds indicates patient is at a high fall risk. Pt educated on interpretation of their score). BERG Balance: 20/56 (A score of <45 indicates the patient is at an increased risk for falls. Education provided to the patient on the interpretation of balance score.)  5xSTS: avg. 23 seconds ((A score of 15 seconds or greater indicates patient is at an increased risk for falls. Education provided to patient on interpretation of balance score)    Mobility Bed Mobility Bed Mobility: Not assessed (pt sitting in chair at start of eval) Transfers Transfers: Sit to Stand;Stand to Sit;Stand Pivot Transfers Sit to Stand: Minimal Assistance - Patient > 75% Stand to Sit: Minimal Assistance - Patient > 75% Stand Pivot Transfers: Moderate Assistance - Patient 50 - 74% Stand Pivot Transfer Details: Visual cues/gestures for sequencing;Verbal cues for sequencing;Verbal cues for technique;Verbal cues for precautions/safety;Verbal cues for gait pattern Stand Pivot Transfer Details (indicate cue type and reason): Moderate assistance to maintain balance and increased cuing for sequencing, posture,  gait mechanics, and body positioning prior to sitting for safety. Min assistance with RW transfers and cuing to come to a full stand prior to transfer. Transfer (Assistive device): 1 person hand held assist (minA with RW Stand pivot transfer) Locomotion   Gait Ambulation: Yes Gait Assistance: Minimal Assistance - Patient > 75% Gait Distance (Feet): 30 Feet Assistive device: Rolling walker Gait Assistance Details: Visual cues/gestures for sequencing;Verbal cues for technique;Verbal cues for precautions/safety;Verbal cues for gait pattern;Verbal cues for sequencing Gait Assistance Details: Pt ambulated 30 feet x2 RW with MinA to prevent LOB secondary to intermittent poor LLE foot clearance in swing. Verbal cuing with decreased LLE foot clearance to increase hip and knee flexion with external cue for landing on heel of LLE secondary LLE DF weakness.  Verbal and manual cuing to faciltate posture and COG within RW to prevent LOB. Increased verbal cuing throughout turns secondary to perseverative BLE movement. Stairs / Additional Locomotion Stairs: Yes Stairs Assistance: Minimal Assistance - Patient > 75% Stair Management Technique: One rail Right;Step to pattern (Verbal and tactile cuing for stair managment technique "up with good, down with the weaker.") Number of Stairs: 8 Height of Stairs: 6 Wheelchair Mobility Wheelchair Mobility: No   Discharge Criteria: Patient will be discharged from PT if patient refuses treatment 3 consecutive times without medical reason, if treatment goals not met, if there is a change in medical status, if patient makes no progress towards goals or if patient is discharged from hospital.  The above assessment, treatment plan, treatment alternatives and goals were discussed and mutually agreed upon: by patient  Henrene Pastor, SPT 06/29/2020, 6:02 PM

## 2020-06-29 NOTE — Patient Care Conference (Signed)
Inpatient RehabilitationTeam Conference and Plan of Care Update Date: 06/29/2020   Time: 10:42 AM    Patient Name: Jacqueline Bonilla      Medical Record Number: [id number]  Date of Birth: 06-Mar-1955 Sex: Female         Room/Bed: 4M11C/4M11C-01 Payor Info: Payor: [employer] MEDICARE / Plan: [place] HMO / Product Type: *No Product type* /    Admit Date/Time:  06/28/2020  3:42 PM  Primary Diagnosis:  CVA (cerebral vascular accident) [facility])  Hospital Problems: Principal Problem:   CVA (cerebral vascular accident) [facility]) Active Problems:   Vascular dementia without behavioral disturbance ([place])   Acute left-sided weakness    Expected Discharge Date: Expected Discharge Date:  (Evals pending; ELOS two weeks)  Team Members Present: Physician leading conference: Dr. Alysia Penna Care Coodinator Present: Dorien Chihuahua, RN, BSN, CRRN;Christina Stroud, BSW Nurse Present: Dorien Chihuahua, RN PT Present: Page Spiro, PT OT Present: Clyda Greener, OT SLP Present: Charolett Bumpers, SLP PPS Coordinator present : Gunnar Fusi, SLP     Current Status/Progress Goal Weekly Team Focus  Bowel/Bladder   Continent x2 PVR q4-6hrs Low residual. LBM 6/28  remain continent  assess toileting needs qshift and PRN   Swallow/Nutrition/ Hydration             ADL's             Mobility   eval pending         Communication             Safety/Cognition/ Behavioral Observations            Pain   No c/o pain  remain free of pain  assess pain management qshift and PRN   Skin   No skin impairments  remain free of skin impairments  assess sskin qshift and PRN     Discharge Planning:      Team Discussion: Subcortical infarct with delayed but accurate responses.  Had residual cognitive issues from previous stroke. Noted STMD and processing deficits. Weak left ankle/foot with good coordination. BP controlled per MD.   Patient on target to meet rehab goals: All evals not completed at the  time of the team conference  *See Care Plan and progress notes for long and short-term goals.   Revisions to Treatment Plan:  SLP working on mild family education on deficits noted and compensatory strategies   Teaching Needs: Safety, medications, dietary modifications, etc.  Current Barriers to Discharge: Decreased caregiver support and Home enviroment access/layout  Possible Resolutions to Barriers: Family education     Medical Summary Current Status: Patient just admitted, blood pressure stable, mild cognitive deficits, mild weakness  Barriers to Discharge: Medical stability   Possible Resolutions to [employer] Focus: Initiate rehabilitation program anticipate short length of stay   Continued Need for Acute Rehabilitation Level of Care: The patient requires daily medical management by a physician with specialized training in physical medicine and rehabilitation for the following reasons: Direction of a multidisciplinary physical rehabilitation program to maximize functional independence : Yes Medical management of patient stability for increased activity during participation in an intensive rehabilitation regime.: Yes Analysis of laboratory values and/or radiology reports with any subsequent need for medication adjustment and/or medical intervention. : Yes   I attest that I was present, lead the team conference, and concur with the assessment and plan of the team.   Dorien Chihuahua B 06/29/2020, 2:09 PM

## 2020-06-29 NOTE — Progress Notes (Signed)
Physical Therapy Session Note  Patient Details  Name: Jacqueline Bonilla MRN: [id number] Date of Birth: 11-Jun-1955  Today's Date: 06/29/2020 PT Individual Time: 1538-1610 PT Individual Time Calculation (min): 32 min   Short Term Goals: Week 1:  PT Short Term Goal 1 (Week 1): Pt will perform stand pivot transfer with CGA to progress towards goal of supervision PT Short Term Goal 2 (Week 1): Pt will ambulate 50 ft CGA to progress towards goal of supervision with RW at 130ft PT Short Term Goal 3 (Week 1): Pt will consistently perform STS without cuing for body spatial awareness and controlled descent in order to increase safety/independence with transitions to a seated position  Skilled Therapeutic Interventions/Progress Updates:    Pt received sitting in recliner and agreeable to therapy session. Reports need to use bathroom. Sit>stand recliner>RW with CGA for steadying - able to recall education from prior therapy session regarding safe hand placement without cuing. Gait training ~47ft into bathroom using RW with min assist for balance - demos lack of sufficient L LE ankle DF activation (and impaired timing of activation) during swing causing her to repeatedly catch her toes, cuing for improvement. Standing with CGA for steadying while pt completed LB clothing management without assist - continent of bladder and performed seated peri-care with set-up assist. Gait training ~5ft using RW out to sink and then to w/c with min assist for balance - continues to have decreased L LE foot clearance, catching toes - cuing for safe AD management at sink.  Transported to/from gym in w/c for time management and energy conservation. Gait training 156ft 2x with seated break between, L UE support over therapist's shoulders instead of AD, with min assist for balance - cuing for L heel strike on initial contact to promote increased ankle DF activation during swing to improve foot clearance, pt demonstrates significant  improvement with this during 2nd gait trail - demos reciprocal stepping pattern with adequate gait speed and good L LE stance control. Transported back to room in w/c. Short distance ~28ft ambulatory transfer w/c>recliner, L UE around therapist's shoulders, with light min assist for balance. Pt left seated in recliner with needs in reach and in care of NT.  Therapy Documentation Precautions:  Precautions Precautions: Fall Precaution Comments: Apraxia, LLE Hemiplegia Restrictions Weight Bearing Restrictions: No  Pain: No reports of pain throughout session.   Therapy/Group: Individual Therapy  Tawana Scale , PT, DPT, CSRS 06/29/2020, 3:28 PM

## 2020-06-29 NOTE — Progress Notes (Signed)
[facility] Individual Statement of Services  Patient Name:  Jacqueline Bonilla  Date:  06/29/2020  Welcome to the [facility].  Our goal is to provide you with an individualized program based on your diagnosis and situation, designed to meet your specific needs.  With this comprehensive rehabilitation program, you will be expected to participate in at least 3 hours of rehabilitation therapies Monday-Friday, with modified therapy programming on the weekends.  Your rehabilitation program will include the following services:  Physical Therapy (PT), Occupational Therapy (OT), Speech Therapy (ST), 24 hour per day rehabilitation nursing, Therapeutic Recreaction (TR), Neuropsychology, Care Coordinator, Rehabilitation Medicine, Nutrition Services, Pharmacy Services, and Other  Weekly team conferences will be held on Wednesdays to discuss your progress.  Your Inpatient Rehabilitation Care Coordinator will talk with you frequently to get your input and to update you on team discussions.  Team conferences with you and your family in attendance may also be held.  Expected length of stay: 16-19 Days  Overall anticipated outcome: Mod I to Supervision  Depending on your progress and recovery, your program may change. Your Inpatient Rehabilitation Care Coordinator will coordinate services and will keep you informed of any changes. Your Inpatient Rehabilitation Care Coordinator's name and contact numbers are listed  below.  The following services may also be recommended but are not provided by the [facility]:   [place] will be made to provide these services after discharge if needed.  Arrangements include referral to agencies that provide these services.  Your insurance has been verified to be:  [employer]  Your primary doctor is:  Horald Pollen    Pertinent information will be shared with your doctor and your insurance company.  Inpatient Rehabilitation Care Coordinator:  Erlene Quan, [place] or [phone]  Information discussed with and copy given to patient by: [name], 06/29/2020, 11:30 AM

## 2020-06-29 NOTE — Progress Notes (Signed)
PROGRESS NOTE   Subjective/Complaints:  Had some redness noted on buttocks by nursing during toilet transfer- no itching or pain   ROS- neg CP, SOB, N/V/D  Objective:   VAS [name] LOWER EXTREMITY VENOUS (DVT)  Result Date: 06/27/2020  Lower Venous DVT Study Patient Name:  Jacqueline Bonilla  Date of Exam:   06/27/2020 Medical Rec #: [id number]           Accession #:    [id number] Date of Birth: [birth year]           Patient Gender: F Patient Age:   65Y Exam Location:  [facility] Procedure:      VAS [name] LOWER EXTREMITY VENOUS (DVT) Referring Phys: [id number] Rosalin Hawking --------------------------------------------------------------------------------  Indications: Stroke.  Risk Factors: None identified. Limitations: Ambient room light. Comparison Study: No prior studies. Performing Technologist: Oliver Hum RVT  Examination Guidelines: A complete evaluation includes B-mode imaging, spectral Doppler, color Doppler, and power Doppler as needed of all accessible portions of each vessel. Bilateral testing is considered an integral part of a complete examination. Limited examinations for reoccurring indications may be performed as noted. The reflux portion of the exam is performed with the patient in reverse Trendelenburg.  +---------+---------------+---------+-----------+----------+--------------+ RIGHT    CompressibilityPhasicitySpontaneityPropertiesThrombus Aging +---------+---------------+---------+-----------+----------+--------------+ CFV      Full           Yes      Yes                                 +---------+---------------+---------+-----------+----------+--------------+ SFJ      Full                                                        +---------+---------------+---------+-----------+----------+--------------+ FV Prox  Full                                                         +---------+---------------+---------+-----------+----------+--------------+ FV Mid   Full                                                        +---------+---------------+---------+-----------+----------+--------------+ FV DistalFull                                                        +---------+---------------+---------+-----------+----------+--------------+ PFV      Full                                                        +---------+---------------+---------+-----------+----------+--------------+  POP      Full           Yes      Yes                                 +---------+---------------+---------+-----------+----------+--------------+ PTV      Full                                                        +---------+---------------+---------+-----------+----------+--------------+ PERO     Full                                                        +---------+---------------+---------+-----------+----------+--------------+   +---------+---------------+---------+-----------+----------+--------------+ LEFT     CompressibilityPhasicitySpontaneityPropertiesThrombus Aging +---------+---------------+---------+-----------+----------+--------------+ CFV      Full           Yes      Yes                                 +---------+---------------+---------+-----------+----------+--------------+ SFJ      Full                                                        +---------+---------------+---------+-----------+----------+--------------+ FV Prox  Full                                                        +---------+---------------+---------+-----------+----------+--------------+ FV Mid   Full                                                        +---------+---------------+---------+-----------+----------+--------------+ FV DistalFull                                                         +---------+---------------+---------+-----------+----------+--------------+ PFV      Full                                                        +---------+---------------+---------+-----------+----------+--------------+ POP      Full           Yes      Yes                                 +---------+---------------+---------+-----------+----------+--------------+  PTV      Full                                                        +---------+---------------+---------+-----------+----------+--------------+ PERO     Full                                                        +---------+---------------+---------+-----------+----------+--------------+     Summary: RIGHT: - There is no evidence of deep vein thrombosis in the lower extremity.  - No cystic structure found in the popliteal fossa.  LEFT: - There is no evidence of deep vein thrombosis in the lower extremity.  - No cystic structure found in the popliteal fossa.  *See table(s) above for measurements and observations. Electronically signed by Ruta Hinds MD on 06/27/2020 at 7:04:52 PM.    Final    Recent Labs    06/29/20 0511  WBC 5.1  HGB 11.6*  HCT 35.5*  PLT 220   Recent Labs    06/29/20 0511  NA 137  K 3.8  CL 104  CO2 29  GLUCOSE 89  BUN 27*  CREATININE 0.94  CALCIUM 9.0    Intake/Output Summary (Last 24 hours) at 06/29/2020 0809 Last data filed at 06/29/2020 0527 Gross per 24 hour  Intake 240 ml  Output 0 ml  Net 240 ml        Physical Exam: Vital Signs Blood pressure 135/78, pulse 71, temperature 97.7 F (36.5 C), resp. rate 17, height 5\' 4"  (1.626 m), last menstrual period 01/02/2003, SpO2 100 %.   General: No acute distress Mood and affect are appropriate Heart: Regular rate and rhythm no rubs murmurs or extra sounds Lungs: Clear to auscultation, breathing unlabored, no rales or wheezes Abdomen: Positive bowel sounds, soft nontender to palpation, nondistended Extremities: No  clubbing, cyanosis, or edema Skin: No evidence of breakdown, no evidence of rash Neurologic: oriented to person and place and situaltion but takes a long time to respond, no dysarthria or aphasia  motor strength is 5/5 in bilateral deltoid, bicep, tricep, grip, hip flexor, knee extensors, RIght ankle dorsiflexor and plantar flexor Left ankle DF and PF are 3/5 Sensory exam normal sensation to light touch and proprioception in bilateral upper and lower extremities Cerebellar exam normal finger to nose to finger as well as heel to shin in bilateral upper and lower extremities Musculoskeletal: Full range of motion in all 4 extremities. No joint swelling   Assessment/Plan: 1. Functional deficits which require 3+ hours per day of interdisciplinary therapy in a comprehensive inpatient rehab setting. Physiatrist is providing close team supervision and 24 hour management of active medical problems listed below. Physiatrist and rehab team continue to assess barriers to discharge/monitor patient progress toward functional and medical goals  Care Tool:  Bathing              Bathing assist       Upper Body Dressing/Undressing Upper body dressing   What is the patient wearing?: Hospital gown only    Upper body assist Assist Level: Minimal Assistance - Patient > 75%    Lower Body Dressing/Undressing Lower body dressing  What is the patient wearing?: Hospital gown only     Lower body assist       Toileting Toileting    Toileting assist Assist for toileting: Minimal Assistance - Patient > 75%     Transfers Chair/bed transfer  Transfers assist           Locomotion Ambulation   Ambulation assist              Walk 10 feet activity   Assist           Walk 50 feet activity   Assist           Walk 150 feet activity   Assist           Walk 10 feet on uneven surface  activity   Assist           Wheelchair     Assist                Wheelchair 50 feet with 2 turns activity    Assist            Wheelchair 150 feet activity     Assist          Blood pressure 135/78, pulse 71, temperature 97.7 F (36.5 C), resp. rate 17, height 5\' 4"  (1.626 m), last menstrual period 01/02/2003, SpO2 100 %.  Medical Problem List and Plan: 1.  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  Antithrombotics: -DVT/anticoagulation:  Pharmaceutical: Lovenox             -antiplatelet therapy: DAPT X 3 weeks followed by ASA alone.  3. Pain Management: N/A 4. Mood: LCSW to follow for evaluation and support.              -antipsychotic agents: N/a 5. Neuropsych: This patient may not be fully capable of making decisions on her own behalf. 6. Skin/Wound Care: Routine pressure relief measures. 7. Fluids/Electrolytes/Nutrition: Monitor I/O. Check lytes in am. 8. HTN: Monitor BP tid--continue Cozaar. Vitals:   06/28/20 1927 06/29/20 0546  BP: (!) 141/70 135/78  Pulse: (!) 101 71  Resp: 18 17  Temp: 98.9 F (37.2 C) 97.7 F (36.5 C)  SpO2: 98% 100%   Controlled 6/29 9. H/o thrombocytopenia: Recheck CBC in am as now on DAPT 11. Dyslipidemia: Continue Lipitor.     LOS: 1 days A FACE TO FACE EVALUATION WAS PERFORMED  Charlett Blake 06/29/2020, 8:09 AM

## 2020-06-29 NOTE — Evaluation (Signed)
Speech Language Pathology Assessment and Plan  Patient Details  Name: Jacqueline Bonilla MRN: [id number] Date of Birth: 08-25-1955  SLP Diagnosis: Cognitive Impairments  Rehab Potential: Good ELOS: 10-12 days    Today's Date: 06/29/2020 SLP Individual Time: 0800-0900 SLP Individual Time Calculation (min): 60 min   Hospital Problem: Principal Problem:   CVA (cerebral vascular accident) [facility]) Active Problems:   Vascular dementia without behavioral disturbance ([place])   Acute left-sided weakness  Past Medical History:  Past Medical History:  Diagnosis Date   Anemia    Anxiety    Depression    Dysmenorrhea    Endometriosis    Fibroid    Hypertension    Microhematuria    negative workup   Osteoporosis    Tachycardia    Thrombocytopenia ([facility])    Past Surgical History:  Past Surgical History:  Procedure Laterality Date   BREAST BIOPSY     CESAREAN SECTION     hysteroscopic resection     implantable loop recorder implant  10/21/2019   Medtronic Reveal Linq model LNQ 22 ([place] [id number]) implantable loop recorder    Assessment / Plan / Recommendation Clinical Impression HPI: Jacqueline Bonilla. 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 She has waxing and waning of symptoms per EMS and CT head negative and patien tiwth mild left aided weakness and ataxia. She decline tPA. MRI/MRA brain done revealing punctate infarct in high left frontal coretex and faint areas of DWI signal in bilateral precentral gyri question acute/subacute infarct or artifact. Incidental note of multiple remote cortical infarcts in cerebral and cerebellar lacunar infarcts. Stroke felt to be embolic and loop  interrogated but negative for A fib. Dr. Erlinda Hong recommended ASA/Plavix X 3 weeks followed by ASA alone. Carotid dopplers negative for significant ICA stenosis. BLE dopplers negative for DVT. Patient with residual mild cognitive deficits with moderate word finding deficits, left sided weakness with apraxia and needs extra time for processing. CIR recommended due to functional decline.   Patient presents with overall mild cognitive deficits as evidenced by SLUMS cognitive assessment (see below for additional information) and further informal assessment. Patient scored 21/30 points with score of 27/30 considered within normal limits. Deficits appear consistent with baseline status from previous CVA per patient's report. Spouse/family was not present to report on current functioning. Patient exhibits deficits in working memory, short-term recall, and attention, which appear to impact higher level problem solving. Patient displays awareness of cognitive challenges and verbalized various compensations she uses with the support of her husband. Despite being near baseline from a cognitive perspective, patient endorsed ongoing frustration with current challenges, and elaborated on her wishes to become more independent. Patient would benefit from skilled ST services to further enhance uses of compensatory memory and cognitive strategies to maximize function, independence, and to reduce caregiver burden. Anticipate patient will require 24/7 supervision and would also benefit from continued SLP services at discharge.   Expressive/receptive language appear WFL. Patient endorsed occasional word finding difficulty. This was not observed during assessment although will continue to monitor. Patient denied chewing/swallowing difficulty and is tolerating regular diet and thin liquids, and pills whole with water.   SLUMS  Orientation: 3/3 Short-term recall word list: 3/5 (5/5 with semantic cue)  Mental Math: 1/3 Verbal fluency:  3/3 Mental manipulation: 1/2 Paragraph Recall: 6/8 (8/8 with field of choices) Clock Drawing: 2/4 Visuospatial reasoning: 2/2    Skilled Therapeutic Interventions          Cognitive-communication assessment with Rea Mental Status Exam, education on memory strategies, and education on plan of care.    SLP Assessment  Patient will need skilled [place] [facility] during CIR admission    Recommendations  Patient destination: Home Follow up Recommendations: Home Health SLP Equipment Recommended: None recommended by SLP    SLP Frequency 3 to 5 out of 7 days   SLP Duration  SLP Intensity  SLP Treatment/Interventions 10-12 days  Minumum of 1-2 x/day, 30 to 90 minutes  Cognitive remediation/compensation;Environmental controls;Internal/external aids;Cueing hierarchy;Functional tasks;Patient/family education;Therapeutic Activities    Pain Pain Assessment Pain Scale: 0-10 Pain Score: 0-No pain Faces Pain Scale: No hurt  Prior Functioning Type of Home: House  Lives With: Spouse Available Help at Discharge: Family;Friend(s);Available 24 hours/day Vocation: Retired  Programmer, systems Overall Cognitive Status: History of cognitive impairments - at baseline Arousal/Alertness: Awake/alert Orientation Level: Oriented X4 Awareness: Appears intact Problem Solving: Impaired Problem Solving Impairment: Functional basic;Functional complex Behaviors: Impulsive Safety/Judgment: Impaired Comments: Pt requires frequent cuing for safety and pause during initial instructions prior to standing  Comprehension WFL   Expression WFL   Oral Motor [facility]   Care Tool Care Tool Cognition Expression of Ideas and Wants Expression of Ideas and Wants: Some difficulty - exhibits some difficulty with expressing needs and ideas (e.g, some words or finishing thoughts) or speech is not clear   Understanding Verbal and Non-Verbal Content Understanding Verbal and  Non-Verbal Content: Usually understands - understands most conversations, but misses some part/intent of message. Requires cues at times to understand   Memory/Recall Ability *first 3 days only Memory/Recall Ability *first 3 days only: Current season;That he or she is in a hospital/hospital unit     Short Term Goals: Week 1: SLP Short Term Goal 1 (Week 1): Patient will utilize external memory aids to recall new information with Min A verbal cues. SLP Short Term Goal 2 (Week 1): Patient will demonstrate selective attention to functional tasks in mildly distracting environment for 20 minuts with Min A verbal cues for redirection SLP Short Term Goal 3 (Week 1): Patient will demonstrate functional problem solving for mildly complex tasks with Min A verbal cues  Refer to Care Plan for Long Term Goals  Recommendations for other services: None   Discharge Criteria: Patient will be discharged from SLP if patient refuses treatment 3 consecutive times without medical reason, if treatment goals not met, if there is a change in medical status, if patient makes no progress towards goals or if patient is discharged from hospital.  The above assessment, treatment plan, treatment alternatives and goals were discussed and mutually agreed upon: by patient  [name] 06/29/2020, 5:11 PM

## 2020-06-29 NOTE — Progress Notes (Signed)
Patient ID: Jacqueline Bonilla, female   DOB: 01-13-1955, 65 y.o.   MRN: [id number] Team Conference Report to Patient/Family  Team Conference discussion was reviewed with the patient and caregiver, including goals, any changes in plan of care and target discharge date.  Patient and caregiver express understanding and are in agreement.  The patient has a target discharge date of  (Evals pending; ELOS two weeks).  Met with pt and spouse. Provided conference updates. Pt d/c date TBD  [name] 06/29/2020, 2:12 PM

## 2020-06-29 NOTE — Progress Notes (Signed)
Inpatient Rehabilitation  Patient information reviewed and entered into eRehab system by Ademide Schaberg Niklas Chretien, OTR/L.   Information including medical coding, functional ability and quality indicators will be reviewed and updated through discharge.    

## 2020-06-29 NOTE — Evaluation (Signed)
Occupational Therapy Assessment and Plan  Patient Details  Name: Jacqueline Bonilla MRN: [id number] Date of Birth: [birth year]  OT Diagnosis: abnormal posture, altered mental status, cognitive deficits, hemiplegia affecting non-dominant side, and muscle weakness (generalized) Rehab Potential: Rehab Potential (ACUTE ONLY): Excellent ELOS: 10-12 days   Today's Date: 06/29/2020 OT Individual Time: 1102-1203 OT Individual Time Calculation (min): 61 min     Hospital Problem: Principal Problem:   CVA (cerebral vascular accident) ([place]) Active Problems:   Vascular dementia without behavioral disturbance ([place])   Acute left-sided weakness   Past Medical History:  Past Medical History:  Diagnosis Date   Anemia    Anxiety    Depression    Dysmenorrhea    Endometriosis    Fibroid    Hypertension    Microhematuria    negative workup   Osteoporosis    Tachycardia    Thrombocytopenia ([place])    Past Surgical History:  Past Surgical History:  Procedure Laterality Date   BREAST BIOPSY     CESAREAN SECTION     hysteroscopic resection     implantable loop recorder implant  10/21/2019   Medtronic Reveal Linq model LNQ 22 ([place] [id number]) implantable loop recorder    Assessment & Plan Clinical Impression: Patient is a 65 y.o. year old female with recent admission to the hospital on 06/24/20 after falling off her treadmill at the gym with sudden onset of left sided weakness and left facial droop. She has waxing and waning of symptoms per EMS and CT head negative and patien tiwth mild left aided weakness and ataxia. She decline tPA. MRI/MRA brain done revealing punctate infarct in high left frontal coretex and faint areas of DWI signal in bilateral precentral gyri question acute/subacute infarct or artifact. Incidental note of multiple remote cortical infarcts in cerebral and cerebellar lacunar infarcts. Stroke felt to be embolic and loop interrogated but negative for A fib. Dr. Erlinda Hong recommended  ASA/Plavix X 3 weeks followed by ASA alone. Carotid dopplers negative for significant ICA stenosis. BLE dopplers negative for DVT. Patient with residual mild cognitive deficits with moderate word finding deficits, left sided weakness with apraxia and needs extra time for processing.  Patient transferred to CIR on 06/28/2020 .    Patient currently requires min with basic self-care skills secondary to muscle weakness, muscle joint tightness, and muscle paralysis, impaired timing and sequencing, unbalanced muscle activation, decreased coordination, and decreased motor planning, decreased attention to right and decreased motor planning, decreased attention, decreased problem solving, decreased memory, and delayed processing, and decreased standing balance, hemiplegia, and decreased balance strategies.  Prior to hospitalization, patient could complete ADLs with independent .  Patient will benefit from skilled intervention to decrease level of assist with basic self-care skills, increase independence with basic self-care skills, and increase level of independence with iADL prior to discharge home with care partner.  Anticipate patient will require 24 hour supervision and follow up outpatient.  OT - End of Session Activity Tolerance: Tolerates 30+ min activity with multiple rests Endurance Deficit: Yes OT Assessment Rehab Potential (ACUTE ONLY): Excellent OT Patient demonstrates impairments in the following area(s): Balance;Cognition;Motor;Sensory;Safety;Perception OT Basic ADL's Functional Problem(s): Eating;Grooming;Bathing;Dressing;Toileting OT Advanced ADL's Functional Problem(s): Light Housekeeping OT Transfers Functional Problem(s): Toilet;Tub/Shower OT Additional Impairment(s): Fuctional Use of Upper Extremity OT Plan OT Intensity: Minimum of 1-2 x/day, 45 to 90 minutes OT Frequency: 5 out of 7 days OT Duration/Estimated Length of Stay: 10-12 days OT Treatment/Interventions: Balance/vestibular  training;Discharge planning;Pain management;Self Care/advanced ADL retraining;Therapeutic Activities;UE/LE Coordination activities;Patient/family education;Cognitive  remediation/compensation;Disease mangement/prevention;Functional mobility training;Therapeutic Exercise;Visual/perceptual remediation/compensation;DME/adaptive equipment instruction;Neuromuscular re-education;Psychosocial support;UE/LE Strength taining/ROM;Community reintegration OT Self Feeding Anticipated Outcome(s): independent OT Basic Self-Care Anticipated Outcome(s): supervision OT Toileting Anticipated Outcome(s): supervision OT Bathroom Transfers Anticipated Outcome(s): supervision OT Recommendation Patient destination: Home Follow Up Recommendations: Outpatient OT;24 hour supervision/assistance Equipment Recommended: To be determined   OT Evaluation Precautions/Restrictions  Precautions Precautions: Fall;Other (comment) Precaution Comments: apraxia, LLE hemiparesis Restrictions Weight Bearing Restrictions: No   Pain Pain Assessment Pain Scale: Faces Pain Score: 0-No pain Home Living/Prior Functioning Home Living Family/patient expects to be discharged to:: Private residence Living Arrangements: Spouse/significant other Available Help at Discharge: Family, Available 24 hours/day Type of Home: House Home Access: Stairs to enter [employer] of Steps: 4 Home Layout: One level Bathroom Shower/Tub: Multimedia programmer: Associate Professor Accessibility: No  Lives With: Spouse IADL History Homemaking Responsibilities: Yes Laundry Responsibility: Primary Current License: Yes (Hasnt' been driving since last CVA though) Education: Retired Optometrist Prior Function Level of Independence: Independent with basic ADLs, Independent with transfers [place]: Retired Vision Baseline Vision/History: Wears glasses Wears Glasses: At all times Patient Visual Report: No change from baseline Vision  Assessment?: Yes Eye Alignment: Within Functional Limits Ocular Range of Motion: Within Functional Limits Alignment/Gaze Preference: Within Defined Limits Tracking/Visual Pursuits: Decreased smoothness of horizontal tracking Convergence: Within functional limits Visual Fields: Impaired-to be further tested in functional context (Pt WFLs for gross testing but noted decreased ability to find her shoe on the right arm of her chair and also did not see the trashcan initially on the right side of the room.) Perception  Perception: Impaired Inattention/Neglect: Does not attend to right visual field;Impaired-to be further tested in functional context Praxis Praxis: Impaired Praxis Impairment Details: Motor planning Praxis-Other Comments: Pt with initial difficulty motor planning use of the hand held shower when trying to grasp it. Cognition Overall Cognitive Status: History of cognitive impairments - at baseline Arousal/Alertness: Awake/alert Orientation Level: Person;Place;Situation Person: Oriented Place: Oriented Situation: Oriented Year: 2022 Month: June Day of Week: Correct Memory: Impaired Memory Impairment: Decreased recall of new information Immediate Memory Recall: Sock;Blue;Bed Memory Recall Sock: Not able to recall Memory Recall Blue: Without Cue Memory Recall Bed: With Cue Attention: Focused;Sustained;Alternating Focused Attention: Appears intact Sustained Attention: Appears intact Awareness: Appears intact Sensation Sensation Light Touch: Appears Intact Hot/Cold: Appears Intact Proprioception: Impaired Detail Proprioception Impaired Details: Impaired LUE Stereognosis: Appears Intact Additional Comments: Pt with inabilty to consistently match  right wrist and elbow position to the LUE as therapist positioned it with eyes closed.  She also was unable to state that they looked different with her eyes opened. Coordination Gross Motor Movements are Fluid and Coordinated:  No Fine Motor Movements are Fluid and Coordinated: No Coordination and Movement Description: Pt with BUE coordination slightly impaired compared to normal with speed and accuracy with finger to nose testing. Motor  Motor Motor: Hemiplegia;Abnormal postural alignment and control Motor - Skilled Clinical Observations: Left hemiplegia with the LE being more impaired than the UE.  Trunk/Postural Assessment  Cervical Assessment Cervical Assessment: Within Functional Limits Thoracic Assessment Thoracic Assessment: Within Functional Limits Lumbar Assessment Lumbar Assessment: Exceptions to WFL (slight posterior pelvic tilt) Postural Control Postural Control: Deficits on evaluation Righting Reactions: Posterior LOB in standing with grooming and dressing tasks, without UE support.  Balance Balance Balance Assessed: Yes Static Sitting Balance Static Sitting - Balance Support: Feet supported Static Sitting - Level of Assistance: 6: Modified independent (Device/Increase time) Dynamic Sitting Balance Dynamic Sitting - Balance Support: During functional activity  Dynamic Sitting - Level of Assistance: 5: Stand by assistance Static Standing Balance Static Standing - Level of Assistance: 4: Min assist Dynamic Standing Balance Dynamic Standing - Balance Support: During functional activity;No upper extremity supported Dynamic Standing - Level of Assistance: 3: Mod assist Dynamic Standing - Comments: mobility Extremity/Trunk Assessment RUE Assessment RUE Assessment: Exceptions to [facility] Passive Range of Motion (PROM) Comments: WFLS Active Range of Motion (AROM) Comments: WFLS overerall, slight decreased elbow extension noted during shoulder flexion General Strength Comments: Strength 4/5 throughout with slight decreased gross and FM coordination noted with functional use and with finger to nose testing.  History of decreased coordination from previous CVA with pt reporting that she still cannot wrist  legibly. LUE Assessment LUE Assessment: Exceptions to [facility] Passive Range of Motion (PROM) Comments: WFLS Active Range of Motion (AROM) Comments: WFLs General Strength Comments: Strength 4/5 throughout with slight decreased gross and FM coordination noted with functional use and with finger to nose testing  Care Tool Care Tool Self Care Eating   Eating Assist Level: Set up assist    Oral Care    Oral Care Assist Level: Minimal Assistance - Patient > 75% (standing)    Bathing   Body parts bathed by patient: Right arm;Left arm;Chest;Abdomen;Front perineal area;Buttocks;Right upper leg;Face;Left lower leg;Left upper leg     Assist Level: Minimal Assistance - Patient > 75%    Upper Body Dressing(including orthotics)   What is the patient wearing?: Pull over shirt;Bra   Assist Level: Supervision/Verbal cueing    Lower Body Dressing (excluding footwear)   What is the patient wearing?: Underwear/pull up;Pants Assist for lower body dressing: Minimal Assistance - Patient > 75%    Putting on/Taking off footwear   What is the patient wearing?: Socks;Shoes Assist for footwear: Supervision/Verbal cueing       Care Tool Toileting Toileting activity   Assist for toileting: Minimal Assistance - Patient > 75%     Care Tool Bed Mobility Roll left and right activity        Sit to lying activity        Lying to sitting edge of bed activity         Care Tool Transfers Sit to stand transfer   Sit to stand assist level: Minimal Assistance - Patient > 75%    Chair/bed transfer         Toilet transfer   Assist Level: Moderate Assistance - Patient 50 - 74% (ambulate with no device)     Care Tool Cognition Expression of Ideas and Wants Expression of Ideas and Wants: Some difficulty - exhibits some difficulty with expressing needs and ideas (e.g, some words or finishing thoughts) or speech is not clear   Understanding Verbal and Non-Verbal Content Understanding Verbal and  Non-Verbal Content: Usually understands - understands most conversations, but misses some part/intent of message. Requires cues at times to understand   Memory/Recall Ability *first 3 days only Memory/Recall Ability *first 3 days only: Current season;That he or she is in a hospital/hospital unit    Refer to Care Plan for [place] 1 OT Short Term Goal 1 (Week 1): Pt will complete all bathing sit to stand with supervision for two consecutive sessions. OT Short Term Goal 2 (Week 1): Pt will complete all dressing sit to stand with close supervision for two consecutive sessions. OT Short Term Goal 3 (Week 1): Pt will complete toilet transfers ambulating with LRAD with supervision. OT Short Term Goal  4 (Week 1): Pt will maintain selective attention to therapy tasks with no more than min instructional cueing for redirection.  Recommendations for other services: None    Skilled Therapeutic Intervention ADL ADL Eating: Set up Where Assessed-Eating: Chair Grooming: Minimal assistance Where Assessed-Grooming: Standing at sink Upper Body Bathing: Supervision/safety Where Assessed-Upper Body Bathing: Chair;Shower Lower Body Bathing: Minimal assistance Where Assessed-Lower Body Bathing: Chair;Shower Upper Body Dressing: Setup Where Assessed-Upper Body Dressing: Chair Lower Body Dressing: Minimal assistance Where Assessed-Lower Body Dressing: Chair Toileting: Minimal assistance Where Assessed-Toileting: Glass blower/designer: Moderate assistance Toilet Transfer Method: Counselling psychologist: Raised toilet seat;Grab bars Social research officer, government: Moderate assistance Social research officer, government Method: Heritage manager: Other (comment) (3:1) Mobility  Transfers Sit to Stand: Minimal Assistance - Patient > 75% Stand to Sit: Minimal Assistance - Patient > 75%  Session Note:  Pt up in the recliner with her spouse present, eager to work  with OT.  She was agreeable to shower and dressing, so had her ambulate to the shower with mod assist for balance and no device.  She removed clothing in sitting with setup and then completed bathing with min assist sit to stand.  Min instructional cueing was needed for thoroughness to recall the need to wash her feet as well as her buttocks.  Motor planning noted when attempting to manipulate/grasp hand held shower as well.  She was able to dry off and ambulate to the bedside recliner with mod assist for dressing tasks.  Increased time for orientation of clothing, but she was able to sequence without cueing.  Supervision for UB dressing with mod assist for LB dressing sit to stand.  She donned her socks and shoes as well with setup.  Mod assist for transfer over to the sink for grooming tasks or brushing her teeth and combing her hair.  Posterior lean and LOB noted in standing when setting up her toothbrush.  Decreased ability to notice the trash can on the right side initially when looking to throw away her paper towel.   Finished session with transfer back to the wheelchair at mod assist secondary to not clearing her LLE.  She was left with the safety alarm pad in place and her spouse present for supervision as well.  Discussed expectations for approximately 1.5-2 week LOB and supervision level goals.  They are understanding and in agreement currently.     Discharge Criteria: Patient will be discharged from OT if patient refuses treatment 3 consecutive times without medical reason, if treatment goals not met, if there is a change in medical status, if patient makes no progress towards goals or if patient is discharged from hospital.  The above assessment, treatment plan, treatment alternatives and goals were discussed and mutually agreed upon: by patient  Johnelle Tafolla OTR/L 06/29/2020, 12:58 PM

## 2020-06-29 NOTE — Progress Notes (Signed)
Inpatient Rehabilitation Care Coordinator Assessment and Plan Patient Details  Name: Jacqueline Bonilla MRN: [id number] Date of Birth: 1955-06-21  Today's Date: 06/29/2020  Hospital Problems: Principal Problem:   CVA (cerebral vascular accident) [facility]) Active Problems:   Vascular dementia without behavioral disturbance ([place])   Acute left-sided weakness  Past Medical History:  Past Medical History:  Diagnosis Date   Anemia    Anxiety    Depression    Dysmenorrhea    Endometriosis    Fibroid    Hypertension    Microhematuria    negative workup   Osteoporosis    Tachycardia    Thrombocytopenia ([place])    Past Surgical History:  Past Surgical History:  Procedure Laterality Date   BREAST BIOPSY     CESAREAN SECTION     hysteroscopic resection     implantable loop recorder implant  10/21/2019   Medtronic Reveal Linq model LNQ 22 ([place] [id number]) implantable loop recorder   Social History:  reports that she has never smoked. She has never used smokeless tobacco. She reports that she does not drink alcohol and does not use drugs.  Family / Support Systems Marital Status: Married Patient Roles: Spouse Spouse/Significant Other: [name] Anticipated Caregiver: Hema Lanza Ability/Limitations of Caregiver: supervision only Caregiver Availability: 24/7 Family Dynamics: suport from spouse  Social History Preferred language: English Religion: Non-Denominational Read: Yes Write: Yes Public relations account executive Issues: n/a Guardian/Conservator: spouse   Abuse/Neglect Abuse/Neglect Assessment Can Be Completed: Yes Physical Abuse: Denies Verbal Abuse: Denies Sexual Abuse: Denies Exploitation of patient/patient's resources: Denies Self-Neglect: Denies  Emotional Status Pt's affect, behavior and adjustment status: pt very pleasant Recent Psychosocial Issues: anxiety, depression Psychiatric History: n/a Substance Abuse History: n/a  Patient / Family Perceptions,  Expectations & Goals Pt/Family understanding of illness & functional limitations: spouse require some education with therapy. Premorbid pt/family roles/activities: Pt previosuly independent and active Anticipated changes in roles/activities/participation: spouse able to assist with roles and task Pt/family expectations/goals: Supervision to [place]: None Premorbid Home Care/DME Agencies: Other (Comment) Radio broadcast assistant) Transportation available at discharge: spouse able to transport Resource referrals recommended: Neuropsychology  Discharge Planning Living Arrangements: Spouse/significant other Support Systems: Spouse/significant other Type of Residence: Private residence (1 level home, 4 steps) Insurance Resources: Multimedia programmer (specify) Actor) Museum/gallery curator Resources: Radio broadcast assistant Screen Referred: No Living Expenses: Medical laboratory scientific officer Management: Patient, Spouse Does the patient have any problems obtaining your medications?: No Home Management: Independent Patient/Family Preliminary Plans: Spouse able to assist Care Coordinator Anticipated Follow Up Needs: HH/OP Expected length of stay: 16-19 Days  Clinical Impression Sw met with pt and spouse, introduced self and explained role. No current questions or concerns, sw will cont to follow up  [name] 06/29/2020, 2:21 PM

## 2020-06-30 NOTE — Progress Notes (Signed)
Speech Language Pathology Daily Session Note  Patient Details  Name: Jacqueline Bonilla MRN: [id number] Date of Birth: [birth year]  Today's Date: 06/30/2020 SLP Individual Time: 1430-1530 SLP Individual Time Calculation (min): 60 min  Short Term Goals: Week 1: SLP Short Term Goal 1 (Week 1): Patient will utilize external memory aids to recall new information with Min A verbal cues. SLP Short Term Goal 2 (Week 1): Patient will demonstrate selective attention to functional tasks in mildly distracting environment for 20 minuts with Min A verbal cues for redirection SLP Short Term Goal 3 (Week 1): Patient will demonstrate functional problem solving for mildly complex tasks with Min A verbal cues  Skilled Therapeutic Interventions: Skilled ST intervention performed with focus on cognitive goals. SLP provided education to spouse on results of yesterday's cognitive evaluation and SLP treatment plan. Spouse indicated agreement with plan and appreciative for additional speech therapy services, as he reported a decline in patient's processing speed, and awareness of errors. SLP emphasized the importance of providing patient with additional processing time, as she informed SLP that she sometimes feels rushed by spouse to complete tasks which becomes frustrating and overwhelming. SLP facilitated training on internal and external memory strategies which were emphasized during visual memory task. Patient successfully recalled locations of 8/8 items associated with a garden scene following both 5, 10, and 15 minute intervals at supervision level. Facilitated counting money (coins only) with Min A verbal and visual cues and additional processing time. Patient completed addition equations with her calculator with Min A verbal reminders of the amounts given. Lastly, facilitated functional problem solving and verbal reasoning task with min-to-mod A verbal (semantic) and visual cues (referred to paper with written  information) for attention, processing, and recall. Patient was left in recliner chair with alarm activated and needs within reach. Continue per current POC.      Pain Pain Assessment Pain Scale: 0-10 Pain Score: 0-No pain  Therapy/Group: Individual Therapy  [name] 06/30/2020, 4:31 PM

## 2020-06-30 NOTE — Progress Notes (Signed)
PROGRESS NOTE   Subjective/Complaints:  No issues overnite , seen in PT amb CGA   ROS- neg CP, SOB, N/V/D  Objective:   No results found. Recent Labs    06/29/20 0511  WBC 5.1  HGB 11.6*  HCT 35.5*  PLT 220    Recent Labs    06/29/20 0511  NA 137  K 3.8  CL 104  CO2 29  GLUCOSE 89  BUN 27*  CREATININE 0.94  CALCIUM 9.0     Intake/Output Summary (Last 24 hours) at 06/30/2020 0911 Last data filed at 06/30/2020 0800 Gross per 24 hour  Intake 500 ml  Output 0 ml  Net 500 ml         Physical Exam: Vital Signs Blood pressure 129/86, pulse 75, temperature 97.9 F (36.6 C), temperature source Oral, resp. rate 14, height 5\' 4"  (1.626 m), last menstrual period 01/02/2003, SpO2 97 %.  General: No acute distress Mood and affect are appropriate Heart: Regular rate and rhythm no rubs murmurs or extra sounds Lungs: Clear to auscultation, breathing unlabored, no rales or wheezes Abdomen: Positive bowel sounds, soft nontender to palpation, nondistended Extremities: No clubbing, cyanosis, or edema Skin: No evidence of breakdown, no evidence of rash    motor strength is 5/5 in bilateral deltoid, bicep, tricep, grip, hip flexor, knee extensors, RIght ankle dorsiflexor and plantar flexor Left ankle DF and PF are 4/5  Musculoskeletal: Full range of motion in all 4 extremities. No joint swelling   Assessment/Plan: 1. Functional deficits which require 3+ hours per day of interdisciplinary therapy in a comprehensive inpatient rehab setting. Physiatrist is providing close team supervision and 24 hour management of active medical problems listed below. Physiatrist and rehab team continue to assess barriers to discharge/monitor patient progress toward functional and medical goals  Care Tool:  Bathing    Body parts bathed by patient: Right arm, Left arm, Chest, Abdomen, Front perineal area, Buttocks, Right upper leg,  Face, Left lower leg, Left upper leg         Bathing assist Assist Level: Minimal Assistance - Patient > 75%     Upper Body Dressing/Undressing Upper body dressing   What is the patient wearing?: Pull over shirt, Bra    Upper body assist Assist Level: Supervision/Verbal cueing    Lower Body Dressing/Undressing Lower body dressing      What is the patient wearing?: Underwear/pull up, Pants     Lower body assist Assist for lower body dressing: Minimal Assistance - Patient > 75%     Toileting Toileting    Toileting assist Assist for toileting: Minimal Assistance - Patient > 75%     Transfers Chair/bed transfer  Transfers assist     Chair/bed transfer assist level: Minimal Assistance - Patient > 75%     Locomotion Ambulation   Ambulation assist      Assist level: Minimal Assistance - Patient > 75% Assistive device: Hand held assist Max distance: 140ft   Walk 10 feet activity   Assist     Assist level: Minimal Assistance - Patient > 75% Assistive device: Hand held assist   Walk 50 feet activity   Assist    Assist  level: Minimal Assistance - Patient > 75% Assistive device: Hand held assist    Walk 150 feet activity   Assist Walk 150 feet activity did not occur: Safety/medical concerns  Assist level: Minimal Assistance - Patient > 75% Assistive device: Hand held assist    Walk 10 feet on uneven surface  activity   Assist Walk 10 feet on uneven surfaces activity did not occur: Safety/medical concerns         Wheelchair     Assist Will patient use wheelchair at discharge?: No   Wheelchair activity did not occur: N/A         Wheelchair 50 feet with 2 turns activity    Assist    Wheelchair 50 feet with 2 turns activity did not occur: N/A       Wheelchair 150 feet activity     Assist  Wheelchair 150 feet activity did not occur: N/A       Blood pressure 129/86, pulse 75, temperature 97.9 F (36.6 C),  temperature source Oral, resp. rate 14, height 5\' 4"  (1.626 m), last menstrual period 01/02/2003, SpO2 97 %.  Medical Problem List and Plan: 1.  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  Antithrombotics: -DVT/anticoagulation:  Pharmaceutical: Lovenox             -antiplatelet therapy: DAPT X 3 weeks followed by ASA alone.  3. Pain Management: N/A 4. Mood: LCSW to follow for evaluation and support.              -antipsychotic agents: N/a 5. Neuropsych: This patient may not be fully capable of making decisions on her own behalf. 6. Skin/Wound Care: Routine pressure relief measures. 7. Fluids/Electrolytes/Nutrition: Monitor I/O. Check lytes in am. 8. HTN: Monitor BP tid--continue Cozaar. Vitals:   06/29/20 1933 06/30/20 0322  BP: 140/74 129/86  Pulse: 89 75  Resp: 16 14  Temp: 98.9 F (37.2 C) 97.9 F (36.6 C)  SpO2: 100% 97%   Controlled 6/30 9. H/o thrombocytopenia: Recheck CBC in am as now on DAPT 11. Dyslipidemia: Continue Lipitor.     LOS: 2 days A FACE TO FACE EVALUATION WAS PERFORMED  Charlett Blake 06/30/2020, 9:11 AM

## 2020-06-30 NOTE — Plan of Care (Signed)
  Problem: Consults Goal: RH STROKE PATIENT EDUCATION Description: See Patient Education module for education specifics  Outcome: Progressing   Problem: RH BOWEL ELIMINATION Goal: RH STG MANAGE BOWEL WITH ASSISTANCE Description: STG Manage Bowel with  mod I Assistance.

## 2020-06-30 NOTE — Plan of Care (Signed)
  Problem: Consults Goal: RH STROKE PATIENT EDUCATION Description: See Patient Education module for education specifics  06/30/2020 1241 by Luiz Iron, RN Outcome: Progressing 06/30/2020 1235 by Luiz Iron, RN Outcome: Progressing   Problem: RH BOWEL ELIMINATION Goal: RH STG MANAGE BOWEL WITH ASSISTANCE Description: STG Manage Bowel with  mod I Assistance. 06/30/2020 1241 by Luiz Iron, RN Outcome: Progressing 06/30/2020 1235 by Luiz Iron, RN Outcome: Not Progressing   Problem: RH SAFETY Goal: RH STG ADHERE TO SAFETY PRECAUTIONS W/ASSISTANCE/DEVICE Description: STG Adhere to Safety Precautions With cues/reminders Assistance/Device. 06/30/2020 1241 by Luiz Iron, RN Outcome: Progressing 06/30/2020 1235 by Luiz Iron, RN Outcome: Not Progressing   Problem: RH PAIN MANAGEMENT Goal: RH STG PAIN MANAGED AT OR BELOW PT'S PAIN GOAL Description: At or below level 4 06/30/2020 1241 by Luiz Iron, RN Outcome: Progressing 06/30/2020 1235 by Luiz Iron, RN Outcome: Not Progressing

## 2020-06-30 NOTE — Progress Notes (Signed)
Occupational Therapy Session Note  Patient Details  Name: Jacqueline Bonilla MRN: [id number] Date of Birth: Feb 17, 1955  Today's Date: 06/30/2020 OT Individual Time: 1005-1059 OT Individual Time Calculation (min): 54 min    Short Term Goals: Week 1:  OT Short Term Goal 1 (Week 1): Pt will complete all bathing sit to stand with supervision for two consecutive sessions. OT Short Term Goal 2 (Week 1): Pt will complete all dressing sit to stand with close supervision for two consecutive sessions. OT Short Term Goal 3 (Week 1): Pt will complete toilet transfers ambulating with LRAD with supervision. OT Short Term Goal 4 (Week 1): Pt will maintain selective attention to therapy tasks with no more than min instructional cueing for redirection.  Skilled Therapeutic Interventions/Progress Updates:    Pt completed shower and dressing during session.  Min assist for functional mobility to the toilet as well as around the room during ADL tasks.  Decreased ability to advance the LLE efficiently with occasional toe drag when wearing her shoe, but not as much without the shoe.  She was able to transfer into the walk-in shower with min assist.  She then completed all bathing with min instructional cueing for sequencing as she did not remember to wash her UB.  Once dried off she worked on dressing at the sink with ability to sequence task with setup of clothing and min assist.  She was able to complete grooming tasks of oral hygiene and brushing her hair at the sink with min guard assist as well.  Finished session with pt sitting in the recliner and working on problem solving pouring her coconut juice into a cup of ice.  Initially, she was able to pour it into a cup without ice with setup.  Then when therapist brought a new cup with ice, straw, and lid.  She demonstrated decreased motor planning of what order to complete it in, initially trying to place straw in the cup of ice and knocking it over.  Mod instructional  cueing to pour leftover juice on ice and then more from the can.   Then placing lid and then straw.  When given verbal cueing, she was able to complete at supervision level.  Call button and phone in reach with safety alarm in place at end of session and pt resting in the recliner.    Therapy Documentation Precautions:  Precautions Precautions: Fall Precaution Comments: Apraxia, LLE Hemiplegia Restrictions Weight Bearing Restrictions: No   Pain: Pain Assessment Pain Scale: Faces Pain Score: 0-No pain ADL: See Care Tool Section for some details of mobility and selfcare  Therapy/Group: Individual Therapy  Ruthann Angulo OTR/L 06/30/2020, 12:50 PM

## 2020-06-30 NOTE — Progress Notes (Signed)
Physical Therapy Session Note  Patient Details  Name: Jacqueline Bonilla MRN: [id number] Date of Birth: 1955/01/28  Today's Date: 06/30/2020 PT Individual Time: [phone] and [id number] PT Individual Time Calculation (min): 57 min and 61 min  Short Term Goals: Week 1:  PT Short Term Goal 1 (Week 1): Pt will perform stand pivot transfer with CGA to progress towards goal of supervision PT Short Term Goal 2 (Week 1): Pt will ambulate 50 ft CGA to progress towards goal of supervision with RW at 131ft PT Short Term Goal 3 (Week 1): Pt will consistently perform STS without cuing for body spatial awareness and controlled descent in order to increase safety/independence with transitions to a seated position  Skilled Therapeutic Interventions/Progress Updates:    Session 1: Pt received sitting in recliner and agreeable to therapy session. Sitting in recliner donned shorts, socks, and shoes with increased time but no hands-on assist. Sit<>stands, no AD, with CGA for steadying throughout session. Reports need to use bathroom. Gait training ~89ft in bathroom, no AD or UE support, min assist for balance - caught L toe 3x on way to bathroom due to poor ankle DF foot clearance but pt able to be aware and correct with no repeated catching as was noted yesterday. Standing CGA for steadying, LB clothing management without assist. Continent of bladder and small BM - seated peri-care without assist. Standing at sink washed hands with cuing for orientation to location of soap and paper towels. Gait training ~215ft, on AD, down to main therapy gym including on/off elevator with CGA and intermittent min assist for balance - caught L toes primarily when distracted or during dual-task otherwise demos improving L LE gait mechanics compared to yesterday. Stair navigation training for L LE NMR with focus on reciprocal stepping pattern on ascent and descent with B HR support min assist for balance - cuing to place whole L foot on  step during ascent.  Dynamic gait training >218ft, no AD, including the following gait challenges:  - horizontal and vertical head turns - R/L lateral side stepping; increased difficulty stepping towards L due to decreased L foot clearance - backwards walking; reciprocal stepping - sudden start/stops - sudden R/L turns All with CGA and intermittent min assist when pt did not fully clear L foot when stepping - continued cuing for increased ankle DF when stepping.  Dynamic gait and L UE NMR task via forward and backwards walking over 2 hockey sticks followed by a 90degree turn - picking up large wooden PEGs out of a cup, carrying them in L hand while walking, and then placing in PEG board - min assist for balance with intermittent poor L foot clearance - continued cuing for increased L ankle DF and heel strike on initial contact.  Dynamic standing balance via large ball toss into rebounder with pt initially having posterior LOB when catching ball on return with shuffled backwards steps - transitioned to staggered stance then back to normal stance with improvement in balance noted - this included pt ambulating to retrieve ball and pick it up from the floor for additional balance challenge - min assist throughout.   Gait training back to room, no AD, with pt having increased L LE toe catching during swing, anticipated due to fatigue requiring constant min assist for balance. Pt left seated in recliner with needs in reach and chair alarm on.   Session 2: Pt received sitting in recliner with her husband, [name], present and pt agreeable to therapy session. Sit<>stands, no  AD, with CGA for steadying during session - towards end of session, with fatigue, pt demos decreased anterior weight shift to come to stand requiring a few attempts to correct. Gait training ~320ft, no AD, down to day room including on/off elevator all with CGA and intermittent min assist when not clearing L toe fully during swing causing  minor anterior LOB. Stepped on/off treadmill using B UE support with CGA/min assist for balance and cuing for sequencing. Donned litegait harness.  Locomotor treadmill training as follows using B UE support throughout (harness for safety but no body weight support provided): - 3min at 1.27mph increased to 1.12mph totaling 448ft while ambulating downhill at a decline of 7 then 8 degrees targeting L LE ankle DF eccentric control for strengthening anterior tib, cuing throughout for heel strike on L - 47min at 1.33mph totaling 58ft, uphill on an incline of 8 degrees; targeting increased L ankle DF clearance during swing  Stepped off treadmill and doffed harness as described above. Gait training ~127ft to main therapy gym, no AD, with CGA/min assist as described above due to decreased L ankle DF. L LE NMR via stepping up/down on/off 1st 6" step with L UE support progressed to no UE support with min assist for balance. Gait training ~111ft, no AD, back to room with more consistent min assist at this time due to L LE fatigue with increased frequency of toe catching. Upon return to room pt's husband inquiring about him using e-stim on pt's L LE daily like he does at home - thearpist educated him on the need for pt to have improved timing of L LE ankle DF activation as opposed to simply having active movement, therefore recommended not doing it at this time so as to avoid muscle fatigue prior to therapy. Pt left seated in recliner with needs in reach, chair alarm on, and husband present.  Therapy Documentation Precautions:  Precautions Precautions: Fall Precaution Comments: Apraxia, LLE Hemiplegia Restrictions Weight Bearing Restrictions: No   Pain:  Session 1: Denies pain during session.  Session 2: At end of session, reports some L foot soreness stating she hit it when she fell off the elliptical at the time her stroke happened - will notify MD and have next therapist follow-up about  this.    Therapy/Group: Individual Therapy  Tawana Scale , PT, DPT, CSRS 06/30/2020, 7:44 AM

## 2020-07-01 NOTE — Progress Notes (Signed)
Occupational Therapy Session Note  Patient Details  Name: Jacqueline Bonilla MRN: [id number] Date of Birth: Aug 26, 1955  Today's Date: 07/01/2020 OT Individual Time: 5885-0277 OT Individual Time Calculation (min): 57 min    Short Term Goals: Week 1:  OT Short Term Goal 1 (Week 1): Pt will complete all bathing sit to stand with supervision for two consecutive sessions. OT Short Term Goal 2 (Week 1): Pt will complete all dressing sit to stand with close supervision for two consecutive sessions. OT Short Term Goal 3 (Week 1): Pt will complete toilet transfers ambulating with LRAD with supervision. OT Short Term Goal 4 (Week 1): Pt will maintain selective attention to therapy tasks with no more than min instructional cueing for redirection.  Skilled Therapeutic Interventions/Progress Updates:    Session 1: [phone])  Pt sitting in recliner to start session.  She was able to donn her shoes with setup and then ambulate to the toilet with min assist to complete toileting tasks.  Grooming tasks of oral hygiene, washing her hands, and combing her hair were completed with min guard assist in standing.  She was also able to apply deodorant and change her shirt as well with min guard in standing.  Noted pt still with some motor planning deficits as when attempting to open her closet, she was not trying to use the handle initially and instead tried to open it from the hinged side.  Next, had her ambulate down to the ortho gym with min assist and no device.  Once episode of toe drag on the left side noted.  There she worked with use of the [employer] on [employer] as well as Therapist, art reproduction.  For visual scanning she demonstrated greater difficulty with use of the LUE requiring in average of 1.9 seconds to locate the dots with 82% accuracy.  With the left she was able to complete with 97% accuracy at 1.7 seconds.  She completed Engineer, technical sales program with the RUE for numbers  1-25 in 2:20 with an average reaction time of 5.6 seconds and 100% accuracy.  Bell cancellation was 27/35 with abnormal pattern of scanning.  With picture reproduction she was able to state the picture was of a triangle shape but attempts to reproduce it were not to the scale of the diagram.  She was able to recognize this with each of the 4 attempts, but could not motor plan how to correct it.  Finished session with ambulation back to the room with min assist and transfer to her recliner.  Call button and phone in reach with safety alarm in place.    Session 2: ([phone])  Pt sitting in recliner with her spouse present.  She was agreeable to therapy and completed functional mobility from her room down to the ortho gym with min assist to begin session.  She was able to complete use of the Nustep for two intervals of 5 mins each and resistance on level 7.  The first set was completed using all extremities with average number of steps at 70.  The 2nd set was completed with an average number of steps at 58-60 using only the lower extremities. Had her transition to the other therapy gym for remainder of session with min assist.  She was able to then complete 9 hole peg test in 51 seconds with the left hand and 60 seconds with the right.  Had her work on Quarry manager as well as Engineer, civil (consulting) task.  She demonstrates decreased visual spatial orientation with clock and house design with numbers not being spaced or placed accurately.  She is able to state that is doesn't look correct but is unable to fix it without assistance.  With the house design, she was unable to correctly place the window and chimney to match the diagram given.  With hand witting she demonstrates decreased ability to stay on the line when printing letters with an elevation noted the further down the paper she would get.  Letters were legible however.  Encouraged continued work on this, with pt reporting that this has been an issue  since the first CVA.  Recommended use of larger lined paper and to make sure that each letter printed touches the line.  Ambulated back to the room at conclusion of session with min assist.  Pt left with PT for next session once back in the room.    Therapy Documentation Precautions:  Precautions Precautions: Fall Precaution Comments: Apraxia, LLE Hemiplegia Restrictions Weight Bearing Restrictions: No   Pain: Pain Assessment Pain Scale: Faces Pain Score: 0-No pain ADL: See Care Tool Section for some details of mobility and selfcare  Therapy/Group: Individual Therapy  [name] OTR/L 07/01/2020, 12:39 PM

## 2020-07-01 NOTE — Progress Notes (Addendum)
Physical Therapy Session Note  Patient Details  Name: Jacqueline Bonilla MRN: [id number] Date of Birth: [birth year]  Today's Date: 07/01/2020 PT Individual Time: 1503-1530 PT Individual Time Calculation (min): 27 min   Short Term Goals: Week 1:  PT Short Term Goal 1 (Week 1): Pt will perform stand pivot transfer with CGA to progress towards goal of supervision PT Short Term Goal 2 (Week 1): Pt will ambulate 50 ft CGA to progress towards goal of supervision with RW at 141ft PT Short Term Goal 3 (Week 1): Pt will consistently perform STS without cuing for body spatial awareness and controlled descent in order to increase safety/independence with transitions to a seated position  Skilled Therapeutic Interventions/Progress Updates:    Handoff of care from OT to start session. Pt sitting in recliner and agreeable to PT tx. No reports of pain and motivated to participate in extra therapy. Sit<>stand with CGA and ambulated downstairs to day room rehab gym, >531ft, with CGA and no AD. She took the stairwell downstairs so she went down 10 + 10 steps with minA and unilateral hand rail support (on L). Step-to pattern for descent with good safety awareness and understanding of technique. Focused entirety of session on high level functional gait training - completed head turns, decreased/increased speeds, serial casting by 2's from 100, sequencing months in ascending and descending order, and dribbling large rubber ball. She required minA for balance while completing dual-cog tasks and minA for balance while completing dynamic gait. She had a few instances of self corrected LOB due to L toe drag and x1 instance of mild LOB requiring minA for correction due to L toe drag. She ambulated back upstairs to her room, >514ft, with standing rest break in the elevator. She requested to use bathroom before returning to recliner - was continent of bladder and able to complete pericare without assist. She ended session seated  in recliner with chair alarm on, husband at bedside, and all needs within reach.   Therapy Documentation Precautions:  Precautions Precautions: Fall Precaution Comments: Apraxia, LLE Hemiplegia Restrictions Weight Bearing Restrictions: No General:    Therapy/Group: Individual Therapy  Kanasia Gayman P Leamon Palau PT 07/01/2020, 3:30 PM

## 2020-07-01 NOTE — Progress Notes (Signed)
Speech Language Pathology Daily Session Note  Patient Details  Name: Jacqueline Bonilla MRN: [id number] Date of Birth: 04-13-1955  Today's Date: 07/01/2020 SLP Individual Time: 1115-1200 SLP Individual Time Calculation (min): 45 min  Short Term Goals: Week 1: SLP Short Term Goal 1 (Week 1): Patient will utilize external memory aids to recall new information with Min A verbal cues. SLP Short Term Goal 2 (Week 1): Patient will demonstrate selective attention to functional tasks in mildly distracting environment for 20 minuts with Min A verbal cues for redirection SLP Short Term Goal 3 (Week 1): Patient will demonstrate functional problem solving for mildly complex tasks with Min A verbal cues  Skilled Therapeutic Interventions: Skilled ST treatment performed with focus on cognitive goals. SLP facilitated sustained attention and mildly complex problem solving task using novel card game "BLINK" with Mod A verbal and visual cues fading to Min A verbal and visual cues to refer to external memory aids for recall of task instructions, and problem solving. Mod A verbal cues for attention in mildly distracting environment (TV noise), fading to Min A with quiet environment. Patient required min A verbal cues to alternate attention back to task following interruptions from hallway during 2 occasions. Patient was able to sustain attention in quiet environment for duration of  45 minute session. Patient was left in chair with alarm activated and needs within reach. Continue per ST POC.    Pain Pain Assessment Pain Scale: 0-10 Pain Score: 0-No pain  Therapy/Group: Individual Therapy  [name] 07/01/2020, 12:41 PM

## 2020-07-01 NOTE — Plan of Care (Signed)
  Problem: Consults Goal: RH STROKE PATIENT EDUCATION Description: See Patient Education module for education specifics  Outcome: Progressing   Problem: RH BOWEL ELIMINATION Goal: RH STG MANAGE BOWEL WITH ASSISTANCE Description: STG Manage Bowel with  mod I Assistance. Outcome: Progressing Goal: RH STG MANAGE BOWEL W/MEDICATION W/ASSISTANCE Description: STG Manage Bowel with Medication with mod I  Assistance. Outcome: Progressing   Problem: RH SAFETY Goal: RH STG ADHERE TO SAFETY PRECAUTIONS W/ASSISTANCE/DEVICE Description: STG Adhere to Safety Precautions With cues/reminders Assistance/Device. Outcome: Progressing   Problem: RH PAIN MANAGEMENT Goal: RH STG PAIN MANAGED AT OR BELOW PT'S PAIN GOAL Description: At or below level 4 Outcome: Progressing   Problem: RH KNOWLEDGE DEFICIT Goal: RH STG INCREASE KNOWLEDGE OF HYPERTENSION Description: Patient will be able to manage HTN with medications and dietary modifications using handouts and educational resources with cues/reminders Outcome: Progressing Goal: RH STG INCREASE KNOWLEGDE OF HYPERLIPIDEMIA Description: Patient will be able to manage HLD with medications and dietary modifications using handouts and educational resources with cues/reminders Outcome: Progressing Goal: RH STG INCREASE KNOWLEDGE OF STROKE PROPHYLAXIS Description: Patient will be able to manage secondary stroke risks with medications and dietary modifications using handouts and educational resources with cues/reminders Outcome: Progressing

## 2020-07-01 NOTE — IPOC Note (Signed)
Overall Plan of Care [facility]) Patient Details Name: Jacqueline Bonilla MRN: [id number] DOB: 01/17/1955  Admitting Diagnosis: CVA (cerebral vascular accident) [facility])  Hospital Problems: Principal Problem:   CVA (cerebral vascular accident) [facility]) Active Problems:   Vascular dementia without behavioral disturbance ([place])   Acute left-sided weakness     Functional Problem List: Nursing Medication Management, Safety, Bowel, Pain, Endurance  PT Balance, Behavior, Endurance, Motor, Safety  OT Balance, Cognition, Motor, Sensory, Safety, Perception  SLP Cognition  TR         Basic ADL's: OT Eating, Grooming, Bathing, Dressing, Toileting     Advanced  ADL's: OT Light Housekeeping     Transfers: PT Bed to Chair, Teacher, early years/pre, Tub/Shower     Locomotion: PT Ambulation, Stairs     Additional Impairments: OT Fuctional Use of Upper Extremity  SLP None      TR      Anticipated Outcomes Item Anticipated Outcome  Self Feeding independent  Swallowing      Basic self-care  supervision  Toileting  supervision   Bathroom Transfers supervision  Bowel/Bladder  manage bowel with mod I assit  Transfers  Supervision  Locomotion  Supervision  Communication     Cognition  Sup A  Pain  pain at or below level 4  Safety/Judgment  maintain safety with cues/reminders   Therapy Plan: PT Intensity: Minimum of 1-2 x/day ,45 to 90 minutes PT Frequency: Total of 15 hours over 7 days of combined therapies PT Duration Estimated Length of Stay: 12-15 OT Intensity: Minimum of 1-2 x/day, 45 to 90 minutes OT Frequency: 5 out of 7 days OT Duration/Estimated Length of Stay: 10-12 days SLP Intensity: Minumum of 1-2 x/day, 30 to 90 minutes SLP Frequency: 3 to 5 out of 7 days SLP Duration/Estimated Length of Stay: 10-12 days   Due to the current state of emergency, patients may not be receiving their 3-hours of Medicare-mandated therapy.   Team Interventions: Nursing Interventions  Bladder Management, Disease Management/Prevention, Medication Management, Discharge Planning, Pain Management, Patient/Family Education  PT interventions Ambulation/gait training, Discharge planning, Functional mobility training, Psychosocial support, Therapeutic Activities, Visual/perceptual remediation/compensation, Balance/vestibular training, Disease management/prevention, Neuromuscular re-education, Skin care/wound management, Therapeutic Exercise, Wheelchair propulsion/positioning, Cognitive remediation/compensation, DME/adaptive equipment instruction, Pain management, Splinting/orthotics, UE/LE Strength taining/ROM, Community reintegration, Technical sales engineer stimulation, Patient/family education, IT trainer, UE/LE Coordination activities  OT Interventions Training and development officer, Discharge planning, Pain management, Self Care/advanced ADL retraining, Therapeutic Activities, UE/LE Coordination activities, Patient/family education, Cognitive remediation/compensation, Disease mangement/prevention, Functional mobility training, Therapeutic Exercise, Visual/perceptual remediation/compensation, DME/adaptive equipment instruction, Neuromuscular re-education, Psychosocial support, UE/LE Strength taining/ROM, Community reintegration  SLP Interventions Cognitive remediation/compensation, Environmental controls, Internal/external aids, English as a second language teacher, Functional tasks, Patient/family education, Therapeutic Activities  TR Interventions    SW/CM Interventions Discharge Planning, Psychosocial Support, Patient/Family Education, Disease Management/Prevention   Barriers to Discharge MD  Medical stability and safety with ambulation   Nursing Decreased caregiver support, Home environment access/layout 1 level 4 ste no rails with spouse who did cooking, patient IADLs, meds, and worked out in gym 7d/wk  PT Behavior, [place] home environment, Decreased caregiver support    OT      SLP      SW        Team Discharge Planning: Destination: PT-Home ,OT- Home , SLP-Home Projected Follow-up: PT-Home health PT, OT-  Outpatient OT, 24 hour supervision/assistance, SLP-Home Health SLP Projected Equipment Needs: PT-Rolling walker with 5" wheels, OT- To be determined, SLP-None recommended by SLP Equipment Details: PT- , OT-  Patient/family  involved in discharge planning: PT- Patient,  OT-Family member/caregiver, SLP-Patient  MD ELOS: 16-19d Medical Rehab Prognosis:  Excellent Assessment: 65 year old female with history of cryptogenic stroke with mild right hand weakness s/p loop recorder, vascular dementia with confusion, paranoia and labile mood, thrombocytopenia, HTN, micronhematuria who was admitted on 06/24/20 after falling off her treadmill at the gym with sudden onset of left sided weakness and left facial droop. She has waxing and waning of symptoms per EMS and CT head negative and patien tiwth mild left aided weakness and ataxia. She decline tPA. MRI/MRA brain done revealing punctate infarct in high left frontal coretex and faint areas of DWI signal in bilateral precentral gyri question acute/subacute infarct or artifact. Incidental note of multiple remote cortical infarcts in cerebral and cerebellar lacunar infarcts. Stroke felt to be embolic and loop interrogated but negative for A fib. Dr. Erlinda Hong recommended ASA/Plavix X 3 weeks followed by ASA alone. Carotid dopplers negative for significant ICA stenosis. BLE dopplers negative for DVT. Patient with residual mild cognitive deficits with moderate word finding deficits, left sided weakness with apraxia and needs extra time for processing. CIR recommended due to functional decline.       See Team Conference Notes for weekly updates to the plan of care

## 2020-07-01 NOTE — Progress Notes (Signed)
Physical Therapy Session Note  Patient Details  Name: Jacqueline Bonilla MRN: [id number] Date of Birth: 12-19-1955  Today's Date: 07/01/2020 PT Individual Time: 1000-1100 PT Individual Time Calculation (min): 60 min   Short Term Goals: Week 1:  PT Short Term Goal 1 (Week 1): Pt will perform stand pivot transfer with CGA to progress towards goal of supervision PT Short Term Goal 2 (Week 1): Pt will ambulate 50 ft CGA to progress towards goal of supervision with RW at 198f PT Short Term Goal 3 (Week 1): Pt will consistently perform STS without cuing for body spatial awareness and controlled descent in order to increase safety/independence with transitions to a seated position  Skilled Therapeutic Interventions/Progress Updates:    Pt greeted seated in recliner to start session, agreeable to PT tx - no reports of pain throughout session. Pt reports frustration from prior OT session as she was unable to draw a triangle. Sit<>stand with CGA and no AD - ambulated from her room downstairs to ortho gym with CGA (intermittent minA needed with x4 toe drag occurrences from L foot). Took stairwell downstairs and needed minA overall with 1 handrail on L to go down 10 + 10 steps. She required minA for opening doors with turn knobs with using her paretic RUE. Completed NMR with BITS system in standing on compliant surface (blue air-ex foam pad) with CGA for balance. Completed Geoboard duplication while using her RUE to draw. She struggled greatly with memory component (10 second delay) and was unable to duplicate mild complexity puzzles. Removed memory component and she required minA for completing mild complexity and modA for completing moderate complexity. She demonstrated decent error recognition but benefited from talking through this. Focused remainder of session on higher level gait training. Ambulated up/down ramp + mulch bit + curb, circuit x3 bouts - minA needed for balance while ambulating on unlevel  surfaces and benefits from instruction on paced activity. Cues needed during gait for relaxing her RUE as it tends to drift upwardly as she's concentrating - provided her with 1lb weight to improve proprioceptive feedback to allow natural arm swing and lower her arm. She completed obstacle course training while weaving in/out of cones with minA for balance and then worked on picking up cones from standing position with CGA for balance. She completed parts of [place] "C" exercises including tandem walking, heel walking, and toe walking - requiring minA for balance throughout and used visual aid for tandem walking. She ambulated back upstairs to her room with CGA and no AD (stand rest break in elevator) and requested to use bathroom once we entered her room. Completed toileting and pericare without assist, continent of bladder. Required CGA for ambulating in her room and CGA for balance as she washed hands at the sink. She remained seated in recliner at end of session with all needs met, chair alarm activiated.   Therapy Documentation Precautions:  Precautions Precautions: Fall Precaution Comments: Apraxia, LLE Hemiplegia Restrictions Weight Bearing Restrictions: No General:     Therapy/Group: Individual Therapy  Hattye Siegfried P Kaelan Emami PT \ 07/01/2020, 7:30 AM

## 2020-07-02 ENCOUNTER — Encounter: Payer: Self-pay | Admitting: Emergency Medicine

## 2020-07-02 DIAGNOSIS — F0150 Vascular dementia without behavioral disturbance: Secondary | ICD-10-CM

## 2020-07-02 NOTE — Progress Notes (Signed)
Physical Therapy Session Note  Patient Details  Name: Jacqueline Bonilla MRN: [id number] Date of Birth: 08-28-1955  Today's Date: 07/02/2020 PT Individual Time: 1012-1105; 4765-4650 PT Individual Time Calculation (min): 53 min + 38 min =Total of 91 minutes   Short Term Goals: Week 1:  PT Short Term Goal 1 (Week 1): Pt will perform stand pivot transfer with CGA to progress towards goal of supervision PT Short Term Goal 2 (Week 1): Pt will ambulate 50 ft CGA to progress towards goal of supervision with RW at 174ft PT Short Term Goal 3 (Week 1): Pt will consistently perform STS without cuing for body spatial awareness and controlled descent in order to increase safety/independence with transitions to a seated position  Skilled Therapeutic Interventions/Progress Updates:     First Session: Pt greeted sitting in recliner with chair alarm on. Pt consents to PT and requests to go to the bathroom before heading to the gym.   Pt ambulated to commode with CGA at start and end of the session. Verbal cuing for heel strike. Pt continent of bladder and able to complete pericare without assistance. Pt utilized RUE support on grab bar to assist in transitioning off lower to the ground commode with supervision.   Sit<>stand transfers throughout session at supervision level with verbal cuing to reach back prior to descending to lower surface.   Trialed NMES with hand switch for NMR of LLE DF in swing phase. >576ft x2 in halls and navigated a slight incline on was to [place] progressing to supervision. Verbal cues for heel strike, posture, and relaxing RUE from flexed position. Noted improvement in ability to consistently heel strike and increased control over eccentric phase of DF allowing patient to have decreased times of catching LLE toe during swing. NMES S -> custom -> rate 35Hz  -> width 300 microseconds --> waveform asym --> intensity 31    Object finding/environmental navigation task completed  with number circles in the hall outside of 4w gym. 4 rounds completed with close supervision and min intermittent cuing for environmental scanning while also paying attention to LLE heel strike. Pt demonstrated ability to grab circles from the ground with close supervision. Noted decreased LLE DF activation with dual tasking.  1st round - navigation in hall with finding 8 circles  2nd round - 4 circles in a specific color order. Order repeated until patient was able to repeat 2x without cuing 3rd round - same as #2, order switched (pt demonstrated difficulty remembering more than 4)  4th round - 5 circles (two of the same color) - pt improved memory with this pattern   Gait training from room <> therapy gym with no AD and CGA and verbal cuing to encourage attention to LLE heel strike. Pt demonstrated decreased heel strike on the way back to the room at the end of session possibly due to fatigue compared to on the way to the therapy gym. Pt able to self correct toe catching without manual assist from therapist.   Pt returned to recliner with chair alarm on and call bell within reach.   Second Session:  Pt sitting in recliner at start of session and is eager to begin PT.   Gait training with navigation outside environment on sidewalk with mixture of level and unlevel surface and NMES with hand switch at [facility] - close Supervision. Pt completed gait training distances of from room to outdoor space (with rest break in elevator) >555ft, outside distance of ~1,070ft with mixture of up  hill/down hill x2, and navigating back up to the room at >526ftft.  Rest breaks in between. Intermittent verbal cuing for heel strike. Pt required increased verbal cuing and attention to LLE DF with going up hills primarily at [place] secondary to increased instances of premature forefoot contact with sidewalk. Pt required verbal cuing on directions and scanning for navigation of path.  NMES S -> custom -> rate 35Hz  -> width 300  microseconds --> waveform asym --> intensity 31   Pt returned to sitting in recliner with chair alarm on, call bell within reach, and per patient request activity booklets on tray in front of her. Pt educated on importance of hydrating after being outside in the heat. No further needs at this time   Therapy Documentation Precautions:  Precautions Precautions: Fall Precaution Comments: Apraxia, LLE Hemiplegia Restrictions Weight Bearing Restrictions: No  Pain: Pain Assessment Pain Scale: 0-10 Pain Score: 0-No pain Faces Pain Scale: No hurt  Therapy/Group: Individual Therapy  Hayleigh Bawa, SPT 07/02/2020, 12:20 PM

## 2020-07-02 NOTE — Plan of Care (Signed)
  Problem: Consults Goal: RH STROKE PATIENT EDUCATION Description: See Patient Education module for education specifics  Outcome: Progressing   Problem: RH BOWEL ELIMINATION Goal: RH STG MANAGE BOWEL WITH ASSISTANCE Description: STG Manage Bowel with  mod I Assistance. Outcome: Progressing Goal: RH STG MANAGE BOWEL W/MEDICATION W/ASSISTANCE Description: STG Manage Bowel with Medication with mod I  Assistance. Outcome: Progressing   Problem: RH SAFETY Goal: RH STG ADHERE TO SAFETY PRECAUTIONS W/ASSISTANCE/DEVICE Description: STG Adhere to Safety Precautions With cues/reminders Assistance/Device. Outcome: Progressing   Problem: RH PAIN MANAGEMENT Goal: RH STG PAIN MANAGED AT OR BELOW PT'S PAIN GOAL Description: At or below level 4 Outcome: Progressing   Problem: RH KNOWLEDGE DEFICIT Goal: RH STG INCREASE KNOWLEDGE OF HYPERTENSION Description: Patient will be able to manage HTN with medications and dietary modifications using handouts and educational resources with cues/reminders Outcome: Progressing Goal: RH STG INCREASE KNOWLEGDE OF HYPERLIPIDEMIA Description: Patient will be able to manage HLD with medications and dietary modifications using handouts and educational resources with cues/reminders Outcome: Progressing Goal: RH STG INCREASE KNOWLEDGE OF STROKE PROPHYLAXIS Description: Patient will be able to manage secondary stroke risks with medications and dietary modifications using handouts and educational resources with cues/reminders Outcome: Progressing

## 2020-07-02 NOTE — Progress Notes (Signed)
Occupational Therapy Session Note  Patient Details  Name: Jacqueline Bonilla MRN: [id number] Date of Birth: [birth year]  Today's Date: 07/02/2020 OT Individual Time: 0800-0900 OT Individual Time Calculation (min): 60 min    Short Term Goals: Week 1:  OT Short Term Goal 1 (Week 1): Pt will complete all bathing sit to stand with supervision for two consecutive sessions. OT Short Term Goal 2 (Week 1): Pt will complete all dressing sit to stand with close supervision for two consecutive sessions. OT Short Term Goal 3 (Week 1): Pt will complete toilet transfers ambulating with LRAD with supervision. OT Short Term Goal 4 (Week 1): Pt will maintain selective attention to therapy tasks with no more than min instructional cueing for redirection.  Skilled Therapeutic Interventions/Progress Updates:    OT session focused on functional mobility, balance, ADL retraining, and memory. Pt received sitting in char requesting to dress only. Completed dressing with assist to orient clothing d/t error 2x and CGA for balance with standing. Pt ambulated to bathroom and completed toileting with CGA. Completed hand hygiene and oral care while standing at sink x3 min with SBA. Pt ambulated off unit with AD and CGA. Following rest break, pt returned to room with 1 cue for path finding/recall. In room, completed sit<>stand 3x 5 reps w/o UE support and pt requiring min A for balance 3x. At end of session, pt left sitting in recliner chair with chair alarm on and all needs in reach.   Therapy Documentation Precautions:  Precautions Precautions: Fall Precaution Comments: Apraxia, LLE Hemiplegia Restrictions Weight Bearing Restrictions: No General:   Vital Signs:   Pain:   ADL: ADL Eating: Set up Where Assessed-Eating: Chair Grooming: Minimal assistance Where Assessed-Grooming: Standing at sink Upper Body Bathing: Supervision/safety Where Assessed-Upper Body Bathing: Chair, Shower Lower Body Bathing: Minimal  assistance Where Assessed-Lower Body Bathing: Chair, Shower Upper Body Dressing: Setup Where Assessed-Upper Body Dressing: Chair Lower Body Dressing: Minimal assistance Where Assessed-Lower Body Dressing: Chair Toileting: Minimal assistance Where Assessed-Toileting: Glass blower/designer: Moderate assistance Toilet Transfer Method: Counselling psychologist: Raised toilet seat, Energy manager: Moderate assistance Social research officer, government Method: Heritage manager: Other (comment) (3:1) Vision   Perception    Praxis   Exercises:   Other Treatments:     Therapy/Group: Individual Therapy  Duayne Cal 07/02/2020, 8:42 AM

## 2020-07-02 NOTE — Progress Notes (Signed)
Speech Language Pathology Daily Session Note  Patient Details  Name: Jacqueline Bonilla MRN: [id number] Date of Birth: 1955/08/07  Today's Date: 07/02/2020 SLP Individual Time: 6734-1937 SLP Individual Time Calculation (min): 45 min  Short Term Goals: Week 1: SLP Short Term Goal 1 (Week 1): Patient will utilize external memory aids to recall new information with Min A verbal cues. SLP Short Term Goal 2 (Week 1): Patient will demonstrate selective attention to functional tasks in mildly distracting environment for 20 minuts with Min A verbal cues for redirection SLP Short Term Goal 3 (Week 1): Patient will demonstrate functional problem solving for mildly complex tasks with Min A verbal cues  Skilled Therapeutic Interventions:   Patient seen for skilled ST session focusing on cognitive function goals. Patient sitting up in recliner when SLP arrived in room. When asked if she has done any journaling, patient reported that she does have a notebook/journal at home and records all the things she does with the times/dates. SLP brought patient a notebook and pen and encouraged her to keep log of daily events during CIR stay. Patient required re-read of story as well as modA cues to use strategies for recall and answering of multiple choice questions based on paragraph stories she read. She performed best when SLP reviewed questions before the story, summarized the main points of story. Patient performed cognitive task of novel word formation game and did require frequent cues as she hesitated to perform actions even after appearing to have adequately learned them. "Do I move this letter over here?", etc. Patient continues to benefit from skilled SLP intervention to maximize cognitive function prior to discharge.  Pain Pain Assessment Pain Scale: 0-10 Pain Score: 0-No pain  Therapy/Group: Individual Therapy  Sonia Baller, MA, CCC-SLP Speech Therapy

## 2020-07-02 NOTE — Progress Notes (Signed)
PROGRESS NOTE   Subjective/Complaints:  No complaints. Happy with progress. Denies pain  ROS: Patient denies fever, rash, sore throat, blurred vision, nausea, vomiting, diarrhea, cough, shortness of breath or chest pain, joint or back pain, headache, or mood change.   Objective:   No results found. No results for input(s): WBC, HGB, HCT, PLT in the last 72 hours. No results for input(s): NA, K, CL, CO2, GLUCOSE, BUN, CREATININE, CALCIUM in the last 72 hours.  Intake/Output Summary (Last 24 hours) at 07/02/2020 1634 Last data filed at 07/02/2020 1305 Gross per 24 hour  Intake 640 ml  Output --  Net 640 ml        Physical Exam: Vital Signs Blood pressure 136/84, pulse 84, temperature 98.1 F (36.7 C), resp. rate 18, height 5\' 4"  (1.626 m), last menstrual period 01/02/2003, SpO2 100 %.  Constitutional: No distress . Vital signs reviewed. HEENT: EOMI, oral membranes moist Neck: supple Cardiovascular: RRR without murmur. No JVD    Respiratory/Chest: CTA Bilaterally without wheezes or rales. Normal effort    GI/Abdomen: BS +, non-tender, non-distended Ext: no clubbing, cyanosis, or edema Psych: pleasant and cooperative    motor strength is 5/5 in bilateral deltoid, bicep, tricep, grip, hip flexor, knee extensors, RIght ankle dorsiflexor and plantar flexor Left ankle DF and PF are 4/5  Musculoskeletal: Full range of motion in all 4 extremities. No joint swelling   Assessment/Plan: 1. Functional deficits which require 3+ hours per day of interdisciplinary therapy in a comprehensive inpatient rehab setting. Physiatrist is providing close team supervision and 24 hour management of active medical problems listed below. Physiatrist and rehab team continue to assess barriers to discharge/monitor patient progress toward functional and medical goals  Care Tool:  Bathing    Body parts bathed by patient: Right arm, Left arm,  Chest, Abdomen, Front perineal area, Buttocks, Right upper leg, Face, Left lower leg, Left upper leg, Right lower leg         Bathing assist Assist Level: Minimal Assistance - Patient > 75%     Upper Body Dressing/Undressing Upper body dressing   What is the patient wearing?: Pull over shirt    Upper body assist Assist Level: Set up assist    Lower Body Dressing/Undressing Lower body dressing      What is the patient wearing?: Pants, Underwear/pull up     Lower body assist Assist for lower body dressing: Contact Guard/Touching assist     Toileting Toileting    Toileting assist Assist for toileting: Contact Guard/Touching assist     Transfers Chair/bed transfer  Transfers assist     Chair/bed transfer assist level: Contact Guard/Touching assist     Locomotion Ambulation   Ambulation assist      Assist level: Minimal Assistance - Patient > 75% Assistive device: No Device Max distance: 200'   Walk 10 feet activity   Assist     Assist level: Minimal Assistance - Patient > 75% Assistive device: No Device   Walk 50 feet activity   Assist Walk 50 feet with 2 turns activity did not occur: Safety/medical concerns (Per PT eval ambulated only 30 feet)  Assist level: Minimal Assistance - Patient >  75% Assistive device: No Device    Walk 150 feet activity   Assist Walk 150 feet activity did not occur: Safety/medical concerns  Assist level: Minimal Assistance - Patient > 75% Assistive device: No Device    Walk 10 feet on uneven surface  activity   Assist Walk 10 feet on uneven surfaces activity did not occur: Safety/medical concerns   Assist level: Minimal Assistance - Patient > 75%     Wheelchair     Assist Will patient use wheelchair at discharge?: No   Wheelchair activity did not occur: N/A         Wheelchair 50 feet with 2 turns activity    Assist    Wheelchair 50 feet with 2 turns activity did not occur: N/A        Wheelchair 150 feet activity     Assist  Wheelchair 150 feet activity did not occur: N/A       Blood pressure 136/84, pulse 84, temperature 98.1 F (36.7 C), resp. rate 18, height 5\' 4"  (1.626 m), last menstrual period 01/02/2003, SpO2 100 %.  Medical Problem List and Plan: 1.  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  Antithrombotics: -DVT/anticoagulation:  Pharmaceutical: Lovenox             -antiplatelet therapy: DAPT X 3 weeks followed by ASA alone.  3. Pain Management: N/A 4. Mood: LCSW to follow for evaluation and support.              -antipsychotic agents: N/a 5. Neuropsych: This patient may not be fully capable of making decisions on her own behalf. 6. Skin/Wound Care: Routine pressure relief measures. 7. Fluids/Electrolytes/Nutrition: Monitor I/O. Check lytes in am. 8. HTN: Monitor BP tid--continue Cozaar. Vitals:   07/02/20 0438 07/02/20 1301  BP: 129/81 136/84  Pulse: 74 84  Resp: 16 18  Temp: 97.7 F (36.5 C) 98.1 F (36.7 C)  SpO2: 100% 100%   Controlled 7/2 9. H/o thrombocytopenia: Recheck CBC in am as now on DAPT 11. Dyslipidemia: Continue Lipitor.     LOS: 4 days A FACE TO FACE EVALUATION WAS PERFORMED  Meredith Staggers 07/02/2020, 4:34 PM

## 2020-07-03 NOTE — Progress Notes (Signed)
Occupational Therapy Session Note  Patient Details  Name: Jacqueline Bonilla MRN: [id number] Date of Birth: 05-17-1955  Today's Date: 07/03/2020 OT Individual Time: 1350-1432 OT Individual Time Calculation (min): 42 min  and Today's Date: 07/03/2020 OT Group Time: 1100-1200 OT Group Time Calculation (min): 60 min    Skilled Therapeutic Interventions/Progress Updates:    Jacqueline Bonilla engaged in therapeutic w/c level dance group focusing on patient choice, UE/LE strengthening, salience, activity tolerance, and social participation. Jacqueline Bonilla was guided through various dance-based exercises involving UEs/LEs and trunk. All music was selected by group members. Emphasis placed on Rt NMR and higher level standing balance. Jacqueline Bonilla with excellent participation in group today! Jacqueline Bonilla stood with RT during 1 song, ambulating without device around circle with CGA and active engagement of UEs/LEs. Affect bright throughout session, grateful of the opportunity for attendance in group. Jacqueline Bonilla was returned to the room by OT.   2nd Session 1:1 tx () Jacqueline Bonilla greeted in the recliner with no c/o pain. ADL needs met. Jacqueline Bonilla ambulated without AD down to the atrium, negotiating on/off elevators and managing controls with cues using her Rt hand. Continued working on higher level balance while Jacqueline Bonilla ambulated in an indoor community setting and also outdoors over uneven brick, concrete, and grassy terrain. CGA for walking in grass and for going up/down curb. Worked on slow, mindful heel toe strike. Jacqueline Bonilla also c/o Rt hand coordination. Had her play some keys on the piano and also provided her with a [facility] HEP in the room at end of tx. Jacqueline Bonilla remained sitting up in the recliner, all needs within reach.   Therapy Documentation Precautions:  Precautions Precautions: Fall Precaution Comments: Apraxia, LLE Hemiplegia Restrictions Weight Bearing Restrictions: No  Therapy Vitals Temp: 98.5 F (36.9 C) Pulse Rate: 83 Resp: 18 BP: 125/78 Patient Position (if  appropriate): Sitting Oxygen Therapy SpO2: 100 % O2 Device: Room Air Pain: no s/s pain during group tx   ADL: ADL Eating: Set up Where Assessed-Eating: Chair Grooming: Minimal assistance Where Assessed-Grooming: Standing at sink Upper Body Bathing: Supervision/safety Where Assessed-Upper Body Bathing: Chair, Multimedia programmer Lower Body Bathing: Minimal assistance Where Assessed-Lower Body Bathing: Chair, Shower Upper Body Dressing: Setup Where Assessed-Upper Body Dressing: Chair Lower Body Dressing: Minimal assistance Where Assessed-Lower Body Dressing: Chair Toileting: Minimal assistance Where Assessed-Toileting: Glass blower/designer: Moderate assistance Toilet Transfer Method: Counselling psychologist: Raised toilet seat, Energy manager: Moderate assistance Social research officer, government Method: Heritage manager: Other (comment) (3:1)     Therapy/Group: Individual Therapy and Group Therapy  Samentha Perham A Ever Gustafson 07/03/2020, 3:58 PM

## 2020-07-03 NOTE — Plan of Care (Signed)
  Problem: Consults Goal: RH STROKE PATIENT EDUCATION Description: See Patient Education module for education specifics  Outcome: Progressing   Problem: RH BOWEL ELIMINATION Goal: RH STG MANAGE BOWEL WITH ASSISTANCE Description: STG Manage Bowel with  mod I Assistance. Outcome: Progressing Goal: RH STG MANAGE BOWEL W/MEDICATION W/ASSISTANCE Description: STG Manage Bowel with Medication with mod I  Assistance. Outcome: Progressing   Problem: RH SAFETY Goal: RH STG ADHERE TO SAFETY PRECAUTIONS W/ASSISTANCE/DEVICE Description: STG Adhere to Safety Precautions With cues/reminders Assistance/Device. Outcome: Progressing   Problem: RH PAIN MANAGEMENT Goal: RH STG PAIN MANAGED AT OR BELOW PT'S PAIN GOAL Description: At or below level 4 Outcome: Progressing   Problem: RH KNOWLEDGE DEFICIT Goal: RH STG INCREASE KNOWLEDGE OF HYPERTENSION Description: Patient will be able to manage HTN with medications and dietary modifications using handouts and educational resources with cues/reminders Outcome: Progressing Goal: RH STG INCREASE KNOWLEGDE OF HYPERLIPIDEMIA Description: Patient will be able to manage HLD with medications and dietary modifications using handouts and educational resources with cues/reminders Outcome: Progressing Goal: RH STG INCREASE KNOWLEDGE OF STROKE PROPHYLAXIS Description: Patient will be able to manage secondary stroke risks with medications and dietary modifications using handouts and educational resources with cues/reminders Outcome: Progressing

## 2020-07-04 LAB — BASIC METABOLIC PANEL
Anion gap: 9 (ref 5–15)
BUN: 26 mg/dL — ABNORMAL HIGH (ref 8–23)
CO2: 26 mmol/L (ref 22–32)
Calcium: 9.3 mg/dL (ref 8.9–10.3)
Chloride: 105 mmol/L (ref 98–111)
Creatinine, Ser: 1 mg/dL (ref 0.44–1.00)
GFR, Estimated: >60 mL/min (ref >60)
Glucose, Bld: 100 mg/dL — ABNORMAL HIGH (ref 70–99)
Potassium: 4.2 mmol/L (ref 3.5–5.1)
Sodium: 140 mmol/L (ref 135–145)

## 2020-07-04 LAB — CBC
HCT: 35.9 % — ABNORMAL LOW (ref 36.0–46.0)
Hemoglobin: 11.4 g/dL — ABNORMAL LOW (ref 12.0–15.0)
MCH: 29.4 pg (ref 26.0–34.0)
MCHC: 31.8 g/dL (ref 30.0–36.0)
MCV: 92.5 fL (ref 80.0–100.0)
Platelets: 254 10*3/uL (ref 150–400)
RBC: 3.88 MIL/uL (ref 3.87–5.11)
RDW: 13 % (ref 11.5–15.5)
WBC: 5.5 10*3/uL (ref 4.0–10.5)
nRBC: 0 % (ref 0.0–0.2)

## 2020-07-04 NOTE — Progress Notes (Signed)
Physical Therapy Session Note  Patient Details  Name: Jacqueline Bonilla MRN: [id number] Date of Birth: 1955/05/21  Today's Date: 07/04/2020 PT Individual Time: 0900-1000 PT Individual Time Calculation (min): 60 min   Short Term Goals: Week 1:  PT Short Term Goal 1 (Week 1): Pt will perform stand pivot transfer with CGA to progress towards goal of supervision PT Short Term Goal 2 (Week 1): Pt will ambulate 50 ft CGA to progress towards goal of supervision with RW at 138ft PT Short Term Goal 3 (Week 1): Pt will consistently perform STS without cuing for body spatial awareness and controlled descent in order to increase safety/independence with transitions to a seated position  Skilled Therapeutic Interventions/Progress Updates:    Pt received seated in recliner in room, agreeable to PT session. No complaints of pain. Pt requesting to take a shower this AM and does not have OT scheduled until PM. Sit to stand with CGA progressing to Supervision throughout session. Ambulatory transfer into bathroom initially with min HHA. Transfer in/out of shower stepping over ledge with min HHA. Pt is setup A for bathing at shower level, close Supervision while standing for washing peri area. Pt is setup A for donning clothing from seated and standing position in room. Ambulation up to 500 ft during session at close Supervision to CGA level, focus on adding in arm swing during gait and LLE clearance. Pt does have onset of L toe catching with fatigue. Ambulation through obstacle course stepping over hurdles, up/down wedge, and sidesteps with cone taps with min A for balance. Pt exhibits decreased balance and LE coordination with cone taps. Sidesteps with cone taps L/R 4 x 10 ft with min A for balance. Forward/backward ambulation 5 x 15 ft with CGA for balance, focus on LLE clearance with backwards ambulation. Tandem gait 4 x 30 ft with min HHA for balance. Pt left seated in recliner in room with needs in reach, chair  alarm in place at end of session.  Therapy Documentation Precautions:  Precautions Precautions: Fall Precaution Comments: Apraxia, LLE Hemiplegia Restrictions Weight Bearing Restrictions: No    Therapy/Group: Individual Therapy   Excell Seltzer, PT, DPT, CSRS  07/04/2020, 12:03 PM

## 2020-07-04 NOTE — Progress Notes (Signed)
Occupational Therapy Session Note  Patient Details  Name: Jacqueline Bonilla MRN: [id number] Date of Birth: [birth year]  Today's Date: 07/04/2020 OT Individual Time: 1420-1520 OT Individual Time Calculation (min): 60 min    Short Term Goals: Week 1:  OT Short Term Goal 1 (Week 1): Pt will complete all bathing sit to stand with supervision for two consecutive sessions. OT Short Term Goal 2 (Week 1): Pt will complete all dressing sit to stand with close supervision for two consecutive sessions. OT Short Term Goal 3 (Week 1): Pt will complete toilet transfers ambulating with LRAD with supervision. OT Short Term Goal 4 (Week 1): Pt will maintain selective attention to therapy tasks with no more than min instructional cueing for redirection.  Skilled Therapeutic Interventions/Progress Updates:    Pt sitting in recliner, no c/o pain, eager to participate for OT session and requesting to use the bathroom first.  All functional mobility completed without AD.  Sit to stand and ambulation to bathroom with CGA.  Toilet transfer completed with CGA as well.  Pt toileted with distant supervision.  Pt then ambulated approximately 300 feet to large gym requiring mod cues to attend to left side to avoid obstacles and to safely enter/exit elevator.   Pt participated in standing table top activities with visual-spatial components.  Pt required mod verbal cues to orient pvc pieces in correct direction and correctly identify needed piece compared to template.  Pt also needed mod verbal cues to help problem solve jigsaw puzzle movements.  Pt ambulated back to room with CGA.  Stand to sit at recliner with supervision.  Call bell in reach, seat alarm on.   Therapy Documentation Precautions:  Precautions Precautions: Fall Precaution Comments: Apraxia, LLE Hemiplegia Restrictions Weight Bearing Restrictions: No    Therapy/Group: Individual Therapy  [name] 07/04/2020, 3:28 PM

## 2020-07-04 NOTE — Progress Notes (Signed)
Physical Therapy Session Note  Patient Details  Name: Jacqueline Bonilla MRN: [id number] Date of Birth: 03-21-1955  Today's Date: 07/04/2020 PT Individual Time: 1103-1200 PT Individual Time Calculation (min): 57 min   Short Term Goals: Week 1:  PT Short Term Goal 1 (Week 1): Pt will perform stand pivot transfer with CGA to progress towards goal of supervision PT Short Term Goal 2 (Week 1): Pt will ambulate 50 ft CGA to progress towards goal of supervision with RW at 149ft PT Short Term Goal 3 (Week 1): Pt will consistently perform STS without cuing for body spatial awareness and controlled descent in order to increase safety/independence with transitions to a seated position  Skilled Therapeutic Interventions/Progress Updates:  Patient seated in recliner on entrance to room. Patient alert and agreeable to PT session. Patient denied pain during session.  Therapeutic Activity: Transfers: Patient performed STS transfers throughout session with supervision. SPVT transfers were performed with supervision/ CGA dependent of balance. Provided verbal cues for maintaining balance, smaller steps taken during pivot stepping, and using BUE to reach back for seat prior to descent to sit.  Gait Training:  Patient ambulated >350 ft x2 with no AD and supervision/ CGA for balance. Demonstrated good L foot clearance throughout with only one instance of quick catch of L toe with pt able to self correct with lengthened step on L. Provided intermittent vc/ tc for maintaining adequate L knee flexion and ankle DF for foot clearance throughout.  Neuromuscular Re-ed: NMR facilitated during session with focus on Bil hip muscle activation and balance. Pt guided in: - Tall kneeling to heel sit with resisted rise to tall kneeling 2x10 reps - quadriped single leg extensions progressed to birddogs with Min A provided initially at hips for balance and progressed to CGA. Improved balance/ performance noted with LLE/ RUE  extension > RLE/ LUE extension. X10 reps - standing hip extensions and 45 degree abductions. X5 reps - standing on step Bil pelvic dips 2x5 reps - resisted toe raises against L2 theraband for Bil ankles with good ant tib muscle activation noted L to R.   NMR performed for improvements in motor control and coordination, balance, sequencing, judgement, and self confidence/ efficacy in performing all aspects of mobility at highest level of independence.   Patient seated  in recliner at end of session with brakes locked, seat alarm set, and all needs within reach.     Therapy Documentation Precautions:  Precautions Precautions: Fall Precaution Comments: Apraxia, LLE Hemiplegia Restrictions Weight Bearing Restrictions: No  Therapy/Group: Individual Therapy  Alger Simons PT, DPT 07/04/2020, 12:50 PM

## 2020-07-04 NOTE — Progress Notes (Signed)
Speech Language Pathology Daily Session Note  Patient Details  Name: TARON CONREY MRN: [id number] Date of Birth: Nov 19, 1955  Today's Date: 07/04/2020 SLP Individual Time: 0802-0827 SLP Individual Time Calculation (min): 25 min  Short Term Goals: Week 1: SLP Short Term Goal 1 (Week 1): Patient will utilize external memory aids to recall new information with Min A verbal cues. SLP Short Term Goal 2 (Week 1): Patient will demonstrate selective attention to functional tasks in mildly distracting environment for 20 minuts with Min A verbal cues for redirection SLP Short Term Goal 3 (Week 1): Patient will demonstrate functional problem solving for mildly complex tasks with Min A verbal cues  Skilled Therapeutic Interventions:Skilled ST services focused on cognitive skills. SLP facilitated mildly complex problem solving skills in account balancing task, pt initially required supervision A verbal cues for organization however faded to mod I with use of a calculator. SLP recommends increasing complexity in next task. Pt was left with call bell within reach and chair alarm set. Recommend to continue ST services.      Pain Pain Assessment Pain Score: 0-No pain  Therapy/Group: Individual Therapy  Murray Durrell  [facility] 07/04/2020, 8:25 AM

## 2020-07-04 NOTE — Progress Notes (Signed)
PROGRESS NOTE   Subjective/Complaints:  Working on balance, tandem gait with PT No complaints  ROS: Patient denies CP, SOB, N/V/D  Objective:   No results found. Recent Labs    07/04/20 0502  WBC 5.5  HGB 11.4*  HCT 35.9*  PLT 254   Recent Labs    07/04/20 0502  NA 140  K 4.2  CL 105  CO2 26  GLUCOSE 100*  BUN 26*  CREATININE 1.00  CALCIUM 9.3    Intake/Output Summary (Last 24 hours) at 07/04/2020 0951 Last data filed at 07/04/2020 0700 Gross per 24 hour  Intake 733 ml  Output --  Net 733 ml         Physical Exam: Vital Signs Blood pressure 124/80, pulse 74, temperature 97.6 F (36.4 C), temperature source Oral, resp. rate 20, height 5\' 4"  (1.626 m), last menstrual period 01/02/2003, SpO2 99 %.   General: No acute distress Mood and affect are appropriate Heart: Regular rate and rhythm no rubs murmurs or extra sounds Lungs: Clear to auscultation, breathing unlabored, no rales or wheezes Abdomen: Positive bowel sounds, soft nontender to palpation, nondistended Extremities: No clubbing, cyanosis, or edema Skin: No evidence of breakdown, no evidence of rash Neurologic:  motor strength is 5/5 in bilateral deltoid, bicep, tricep, grip, hip flexor, knee extensors, ankle dorsiflexor and plantar flexor  Cerebellar exam normal finger to nose to finger as well as heel to shin in bilateral upper and lower extremities Musculoskeletal: Full range of motion in all 4 extremities. No joint swelling      Assessment/Plan: 1. Functional deficits which require 3+ hours per day of interdisciplinary therapy in a comprehensive inpatient rehab setting. Physiatrist is providing close team supervision and 24 hour management of active medical problems listed below. Physiatrist and rehab team continue to assess barriers to discharge/monitor patient progress toward functional and medical goals  Care Tool:  Bathing     Body parts bathed by patient: Right arm, Left arm, Chest, Abdomen, Front perineal area, Buttocks, Right upper leg, Face, Left lower leg, Left upper leg, Right lower leg         Bathing assist Assist Level: Minimal Assistance - Patient > 75%     Upper Body Dressing/Undressing Upper body dressing   What is the patient wearing?: Pull over shirt    Upper body assist Assist Level: Set up assist    Lower Body Dressing/Undressing Lower body dressing      What is the patient wearing?: Pants, Underwear/pull up     Lower body assist Assist for lower body dressing: Contact Guard/Touching assist     Toileting Toileting    Toileting assist Assist for toileting: Contact Guard/Touching assist     Transfers Chair/bed transfer  Transfers assist     Chair/bed transfer assist level: Contact Guard/Touching assist     Locomotion Ambulation   Ambulation assist      Assist level: Minimal Assistance - Patient > 75% Assistive device: No Device Max distance: 200'   Walk 10 feet activity   Assist     Assist level: Minimal Assistance - Patient > 75% Assistive device: No Device   Walk 50 feet activity   Assist  Walk 50 feet with 2 turns activity did not occur: Safety/medical concerns (Per PT eval ambulated only 30 feet)  Assist level: Minimal Assistance - Patient > 75% Assistive device: No Device    Walk 150 feet activity   Assist Walk 150 feet activity did not occur: Safety/medical concerns  Assist level: Minimal Assistance - Patient > 75% Assistive device: No Device    Walk 10 feet on uneven surface  activity   Assist Walk 10 feet on uneven surfaces activity did not occur: Safety/medical concerns   Assist level: Minimal Assistance - Patient > 75%     Wheelchair     Assist Will patient use wheelchair at discharge?: No   Wheelchair activity did not occur: N/A         Wheelchair 50 feet with 2 turns activity    Assist    Wheelchair 50  feet with 2 turns activity did not occur: N/A       Wheelchair 150 feet activity     Assist  Wheelchair 150 feet activity did not occur: N/A       Blood pressure 124/80, pulse 74, temperature 97.6 F (36.4 C), temperature source Oral, resp. rate 20, height 5\' 4"  (1.626 m), last menstrual period 01/02/2003, SpO2 99 %.  Medical Problem List and Plan: 1.  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  Antithrombotics: -DVT/anticoagulation:  Pharmaceutical: Lovenox             -antiplatelet therapy: DAPT X 3 weeks followed by ASA alone.  3. Pain Management: N/A 4. Mood: LCSW to follow for evaluation and support.              -antipsychotic agents: N/a 5. Neuropsych: This patient may not be fully capable of making decisions on her own behalf. 6. Skin/Wound Care: Routine pressure relief measures. 7. Fluids/Electrolytes/Nutrition: Monitor I/O. Check lytes in am. 8. HTN: Monitor BP tid--continue Cozaar. Vitals:   07/03/20 1925 07/04/20 0504  BP: (!) 150/77 124/80  Pulse: 94 74  Resp: 20 20  Temp: 98.3 F (36.8 C) 97.6 F (36.4 C)  SpO2: 100% 99%   Controlled 7/4 9. H/o thrombocytopenia: Recheck CBC in am as now on DAPT 11. Dyslipidemia: Continue Lipitor.     LOS: 6 days A FACE TO FACE EVALUATION WAS PERFORMED  Charlett Blake 07/04/2020, 9:51 AM

## 2020-07-05 MED ORDER — LOSARTAN POTASSIUM 25 MG PO TABS
25.0000 mg | ORAL_TABLET | Freq: Every day | ORAL | Status: DC
  Administered 2020-07-05 – 2020-07-08 (×4): 25 mg via ORAL
  Filled 2020-07-05 (×4): qty 1

## 2020-07-05 NOTE — Progress Notes (Signed)
Occupational Therapy Session Note  Patient Details  Name: Jacqueline Bonilla MRN: [id number] Date of Birth: [birth year]  Today's Date: 07/05/2020 OT Individual Time: 7209-4709 OT Individual Time Calculation (min): 56 min    Short Term Goals: Week 1:  OT Short Term Goal 1 (Week 1): Pt will complete all bathing sit to stand with supervision for two consecutive sessions. OT Short Term Goal 2 (Week 1): Pt will complete all dressing sit to stand with close supervision for two consecutive sessions. OT Short Term Goal 3 (Week 1): Pt will complete toilet transfers ambulating with LRAD with supervision. OT Short Term Goal 4 (Week 1): Pt will maintain selective attention to therapy tasks with no more than min instructional cueing for redirection.  Skilled Therapeutic Interventions/Progress Updates:    Session 1: [phone])  Pt in recliner to start with request to work on grooming tasks and dressing.  She was able to ambulate short distances in the room with close supervision to min guard for safety while gathering items for dressing.  Completed dressing sit to stand from the bedside recliner with supervision and min instructional cueing for sitting to complete some tasks such as donning her shirt or removing her gripper socks.  Slight motor planning deficits noted as well as she picked up her clean shirt and pants and tossed the shirt in the floor instead of placing it back on the bed.  She was able to ambulate to the sink and complete oral hygiene and application of deodorant with supervision.  Next, had her ambulate down to the therapy gym with min guard assist where she completed sorting board task with increased time and supervision, having to match a colored dots to a diagram in rows of 4.  Increased time needed to complete task overall secondary to motor planning and perceptual deficits.  Finished session with ambulation back to the room at min guard assist and no device.    Session 2: (4765-4650)  Pt  ambulated from her room down to the ADL apartment with min guard assist and no device.  She continues to need min to mod instructional cueing to locate therapy gym, even though she has been down to the area several times.  Had her complete several simulated step in shower transfers with use of the 2X4 with close supervision.  Next, therapist gave her a list of 4 items to be located in the kitchen to work on visual scanning.  She was able to verbally recall 2/4 with 2-3 minute delay.  She was able to use visual cueing and min assist to help remember the other two once she saw them in the pantry/cabinets.  Next, had her ambulate to the BITS where she worked on [employer] in standing.  She was able to replicate pattern on the board with 100% accuracy on 5/9 attempts and 80% accuracy on the other 3.  Worked on finding her way back to her room with mod instructional cueing using signs in the hallway.  Finished session with pt sitting in the wheelchair and with the call button and phone in reach and safety alarm in place.    Therapy Documentation Precautions:  Precautions Precautions: Fall Precaution Comments: Apraxia, LLE Hemiplegia Restrictions Weight Bearing Restrictions: No  Pain: Pain Assessment Pain Scale: 0-10 Faces Pain Scale: No hurt ADL: See Care Tool Section for some details of mobility and selfcare   Therapy/Group: Individual Therapy  Rakiyah Esch OTR/L 07/05/2020, 9:58 AM

## 2020-07-05 NOTE — Progress Notes (Signed)
PROGRESS NOTE   Subjective/Complaints:   ROS: Patient denies CP, SOB, N/V/D  Objective:   No results found. Recent Labs    07/04/20 0502  WBC 5.5  HGB 11.4*  HCT 35.9*  PLT 254    Recent Labs    07/04/20 0502  NA 140  K 4.2  CL 105  CO2 26  GLUCOSE 100*  BUN 26*  CREATININE 1.00  CALCIUM 9.3     Intake/Output Summary (Last 24 hours) at 07/05/2020 0724 Last data filed at 07/04/2020 1215 Gross per 24 hour  Intake 236 ml  Output --  Net 236 ml         Physical Exam: Vital Signs Blood pressure (!) 144/93, pulse 74, temperature 98.1 F (36.7 C), temperature source Oral, resp. rate 20, height 5\' 4"  (1.626 m), last menstrual period 01/02/2003, SpO2 100 %.    Neurologic:  motor strength is 5/5 in bilateral deltoid, bicep, tricep, grip, hip flexor, knee extensors, 5/5 RIght and 4- left ankle dorsiflexor and plantar flexor  Cerebellar exam normal finger to nose to finger as well as heel to shin in bilateral upper and lower extremities Musculoskeletal: Full range of motion in all 4 extremities. No joint swelling      Assessment/Plan: 1. Functional deficits which require 3+ hours per day of interdisciplinary therapy in a comprehensive inpatient rehab setting. Physiatrist is providing close team supervision and 24 hour management of active medical problems listed below. Physiatrist and rehab team continue to assess barriers to discharge/monitor patient progress toward functional and medical goals  Care Tool:  Bathing    Body parts bathed by patient: Right arm, Left arm, Chest, Abdomen, Front perineal area, Buttocks, Right upper leg, Face, Left lower leg, Left upper leg, Right lower leg         Bathing assist Assist Level: Set up assist     Upper Body Dressing/Undressing Upper body dressing   What is the patient wearing?: Bra, Pull over shirt    Upper body assist Assist Level: Set up assist     Lower Body Dressing/Undressing Lower body dressing      What is the patient wearing?: Pants, Underwear/pull up     Lower body assist Assist for lower body dressing: Set up assist     Toileting Toileting    Toileting assist Assist for toileting: Supervision/Verbal cueing     Transfers Chair/bed transfer  Transfers assist     Chair/bed transfer assist level: Contact Guard/Touching assist     Locomotion Ambulation   Ambulation assist      Assist level: Contact Guard/Touching assist Assistive device: No Device Max distance: 500'   Walk 10 feet activity   Assist     Assist level: Contact Guard/Touching assist Assistive device: No Device   Walk 50 feet activity   Assist Walk 50 feet with 2 turns activity did not occur: Safety/medical concerns (Per PT eval ambulated only 30 feet)  Assist level: Contact Guard/Touching assist Assistive device: No Device    Walk 150 feet activity   Assist Walk 150 feet activity did not occur: Safety/medical concerns  Assist level: Contact Guard/Touching assist Assistive device: No Device    Walk 10  feet on uneven surface  activity   Assist Walk 10 feet on uneven surfaces activity did not occur: Safety/medical concerns   Assist level: Minimal Assistance - Patient > 75%     Wheelchair     Assist Will patient use wheelchair at discharge?: No   Wheelchair activity did not occur: N/A         Wheelchair 50 feet with 2 turns activity    Assist    Wheelchair 50 feet with 2 turns activity did not occur: N/A       Wheelchair 150 feet activity     Assist  Wheelchair 150 feet activity did not occur: N/A       Blood pressure (!) 144/93, pulse 74, temperature 98.1 F (36.7 C), temperature source Oral, resp. rate 20, height 5\' 4"  (1.626 m), last menstrual period 01/02/2003, SpO2 100 %.  Medical Problem List and Plan: 1.  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  Antithrombotics: -DVT/anticoagulation:  Pharmaceutical: Lovenox             -antiplatelet therapy: DAPT X 3 weeks followed by ASA alone.  3. Pain Management: N/A 4. Mood: LCSW to follow for evaluation and support.              -antipsychotic agents: N/a 5. Neuropsych: This patient may not be fully capable of making decisions on her own behalf. 6. Skin/Wound Care: Routine pressure relief measures. 7. Fluids/Electrolytes/Nutrition: Monitor I/O. Check lytes in am. 8. HTN: Monitor BP tid--continue Cozaar. Vitals:   07/04/20 1929 07/05/20 0445  BP: (!) 157/92 (!) 144/93  Pulse: 84 74  Resp: 20 20  Temp: 97.9 F (36.6 C) 98.1 F (36.7 C)  SpO2: 100% 100%  Resume Cozaar 9. H/o thrombocytopenia: Recheck CBC in am as now on DAPT- plt nl 254K 11. Dyslipidemia: Continue Lipitor.     LOS: 7 days A FACE TO FACE EVALUATION WAS PERFORMED  Jacqueline Bonilla 07/05/2020, 7:24 AM

## 2020-07-05 NOTE — Progress Notes (Signed)
Physical Therapy Session Note  Patient Details  Name: Jacqueline Bonilla MRN: [id number] Date of Birth: 08-26-1955  Today's Date: 07/05/2020 PT Individual Time: 1110-1205 and 5852-7782 PT Individual Time Calculation (min): 55 min and 31 min  Short Term Goals: Week 1:  PT Short Term Goal 1 (Week 1): Pt will perform stand pivot transfer with CGA to progress towards goal of supervision PT Short Term Goal 2 (Week 1): Pt will ambulate 50 ft CGA to progress towards goal of supervision with RW at 138ft PT Short Term Goal 3 (Week 1): Pt will consistently perform STS without cuing for body spatial awareness and controlled descent in order to increase safety/independence with transitions to a seated position  Skilled Therapeutic Interventions/Progress Updates:    Session 1: Pt received sitting in recliner and agreeable to therapy session. Sit<>stands, no AD, with close supervision during session. Gait training >1,039ft on/off elevator and downstairs to outside, no AD, with CGA for safety - demos minor foot slap on L LE at heel strike due to poor ankle DF eccentric control - has adequate L foot clearance during swing, not catching of toes as previously seen. Outside gait training, no AD, included: up/down ramps, on/off curb step, over brick pathway, and stairs using R UE support on HR - requires CGA for steadying until on the stairs requires min assist for balance due to poor foot clearance to place foot up on stair and then poor foot placement on descent, repeated multiple times during session with improvement in foot clearance and placement noticed resulting in pt only requiring CGA. At end of outdoor gait training pt demos 1x poor L foot clearance, catching toes but pt able to recover with only CGA, anticipate this is due to fatigue. Educated pt on need for assistance with stairs and to take seated rest breaks when noticing decreased L foot clearance.  Dynamic standing balance and L LE NMR tasks including: -  repeated L LE forward/backwards step up/down on/off 1st 6" step without UE support, CGA for steadying, requires max cuing for sequencing - repeated L LE lateral step up/down on/off 6" step, no UE support, CGA and increased time/cuing to problem solve increased side step length to allow room for other foot - 4 square stepping over hockey sticks - clockwise & counter-clockwise including random diagonal steps - pt demos most difficulty stepping backwards leading with R LE due to poor R backwards weight shift along with difficulties with diagonal stepping in all directions - 4-square stepping towards external targets varying number and color of cuing then dual-task cognitive challenge of recalling location of targets after 90degree L turn, CGA with a few instances of min assist for balance - large ball toss into re-bounder with forward/backwards stepping sequence targeting a dual-cognitive challenge - requires max step-by-step cuing for sequencing - CGA for steadying  Gait training ~293ft back to room, no AD, with CGA for safety, no instances of L toe catching but continues with poor ankle DF eccentric control on initial contact causing minor foot slap. Pt left sitting in recliner with needs in reach and chair alarm on.  Session 2: Pt received sitting in recliner and agreeable to therapy session. Sit<>stands, no AD, with supervision for safety during session. Gait training ~170ft, no AD, down to main therapy gym with CGA progressing to supervision - demos good L LE foot clearance with only 2x skidding it on the floor during swing due to lack of sufficient ankle DF but no unsteadiness associated with that.  Gait  training ~342ft with functional electrical stimulation (FES) to L anterior tibialis using trigger switch to time e-stim with swing phase of gait - setting: NMES S --> custom --> rate 35Hz  --> width 300 microseconds --> waveform asym --> intensity 34. Pt tolerated e-stim well with no adverse reactions and  demonstrates increased L ankle DF during swing with improved eccentric anterior tib control on heel strike to prevent foot slap. Gait training ~132ft back up to room, no AD, with close supervision for safety and pt demos excellent L toe clearance during swing with no instances of instability. Of note: pt very cold during session with tips of fingers on both hands turning blue, provided pt heated  blanket. At end of session pt left sitting in recliner with needs in reach, chair alarm on, and meal tray set-up.  Therapy Documentation Precautions:  Precautions Precautions: Fall Precaution Comments: Apraxia, LLE Hemiplegia Restrictions Weight Bearing Restrictions: No   Pain:  Session 1: Denies pain during session.  Session 2: Denies pain during session.    Therapy/Group: Individual Therapy  Tawana Scale , PT, DPT, NCS, CSRS  07/05/2020, 7:46 AM

## 2020-07-05 NOTE — Evaluation (Signed)
Recreational Therapy Assessment and Plan  Patient Details  Name: Jacqueline Bonilla MRN: [id number] Date of Birth: Dec 05, 1955 Today's Date: 07/05/2020  Rehab Potential:  Good ELOS:  5 days Assessment Hospital Problem: Principal Problem:   CVA (cerebral vascular accident) [facility]) Active Problems:   Vascular dementia without behavioral disturbance ([place])   Acute left-sided weakness     Past Medical History:      Past Medical History:  Diagnosis Date   Anemia     Anxiety     Depression     Dysmenorrhea     Endometriosis     Fibroid     Hypertension     Microhematuria      negative workup   Osteoporosis     Tachycardia     Thrombocytopenia ([place])      Past Surgical History:       Past Surgical History:  Procedure Laterality Date   BREAST BIOPSY       CESAREAN SECTION       hysteroscopic resection       implantable loop recorder implant   10/21/2019    [employer] Reveal Linq model LNQ 22 ([place] [id number]) implantable loop recorder      Assessment & Plan Clinical Impression: Patient is a 65 y.o. year old female with recent admission to the hospital on 06/24/20 after falling off her treadmill at the gym with sudden onset of left sided weakness and left facial droop. She has waxing and waning of symptoms per EMS and CT head negative and patien tiwth mild left aided weakness and ataxia. She decline tPA. MRI/MRA brain done revealing punctate infarct in high left frontal coretex and faint areas of DWI signal in bilateral precentral gyri question acute/subacute infarct or artifact. Incidental note of multiple remote cortical infarcts in cerebral and cerebellar lacunar infarcts. Stroke felt to be embolic and loop interrogated but negative for A fib. Dr. Erlinda Hong recommended ASA/Plavix X 3 weeks followed by ASA alone. Carotid dopplers negative for significant ICA stenosis. BLE dopplers negative for DVT. Patient with residual mild cognitive deficits with moderate word finding deficits, left sided  weakness with apraxia and needs extra time for processing.  Patient transferred to CIR on 06/28/2020 .    Met with pt today to discuss leisure interests, activity analysis/modifications, relaxation strategies.  Pt presents with decreased activity tolerance, decreased functional mobility, decreased balance, decreased coordination,righ inattention, decreased attention, decreased problem solving, decreased memory, and delayed processing Limiting pt's independence with leisure/community pursuits.   Plan Min 1 TR session >20 minutes per week duirng LOS  Recommendations for other services: Neuropsych  Discharge Criteria: Patient will be discharged from TR if patient refuses treatment 3 consecutive times without medical reason.  If treatment goals not met, if there is a change in medical status, if patient makes no progress towards goals or if patient is discharged from hospital.  The above assessment, treatment plan, treatment alternatives and goals were discussed and mutually agreed upon: by patient  [place] 07/05/2020, 4:04 PM

## 2020-07-06 NOTE — Progress Notes (Signed)
Speech Language Pathology Daily Session Note  Patient Details  Name: Jacqueline Bonilla MRN: [id number] Date of Birth: Apr 28, 1955  Today's Date: 07/06/2020 SLP Individual Time: 3202-3343 SLP Individual Time Calculation (min): 45 min  Short Term Goals: Week 1: SLP Short Term Goal 1 (Week 1): Patient will utilize external memory aids to recall new information with Min A verbal cues. SLP Short Term Goal 2 (Week 1): Patient will demonstrate selective attention to functional tasks in mildly distracting environment for 20 minuts with Min A verbal cues for redirection SLP Short Term Goal 3 (Week 1): Patient will demonstrate functional problem solving for mildly complex tasks with Min A verbal cues  Skilled Therapeutic Interventions: Skilled ST intervention performed with focus on cognitive goals. Patient expressed her hopes for increasing confidence and understanding of current medications. Provided education on medication names, purpose, and frequency with occasional verbal repetition for comprehension. Nurse administered medication during session. Patient was observed asking beneficial questions related to medications, and nurse provided helpful clarification. ST prompted patient with additional probing questions for clarification. MD arrived at end of session for further discussion. Facilitated medication management task involving ID medication organization errors in weekly pillbox, and generating solutions to repair errors. Patient completed task with 80% accuracy and min A verbal and visual cues for visual scanning and recall of medication label instructions. Able to repair organization errors with independence once they were identified. Patient was left in chair with alarm activated and call bell within reach. MD present. Continue per ST POC.    Pain Pain Assessment Pain Scale: 0-10 Pain Score: 0-No pain  Therapy/Group: Individual Therapy  [name] 07/06/2020, 8:38 AM

## 2020-07-06 NOTE — Plan of Care (Signed)
  Problem: Consults Goal: RH STROKE PATIENT EDUCATION Description: See Patient Education module for education specifics  Outcome: Progressing   Problem: RH BOWEL ELIMINATION Goal: RH STG MANAGE BOWEL WITH ASSISTANCE Description: STG Manage Bowel with  mod I Assistance. Outcome: Progressing Goal: RH STG MANAGE BOWEL W/MEDICATION W/ASSISTANCE Description: STG Manage Bowel with Medication with mod I  Assistance. Outcome: Progressing   Problem: RH SAFETY Goal: RH STG ADHERE TO SAFETY PRECAUTIONS W/ASSISTANCE/DEVICE Description: STG Adhere to Safety Precautions With cues/reminders Assistance/Device. Outcome: Progressing   Problem: RH PAIN MANAGEMENT Goal: RH STG PAIN MANAGED AT OR BELOW PT'S PAIN GOAL Description: At or below level 4 Outcome: Progressing   Problem: RH KNOWLEDGE DEFICIT Goal: RH STG INCREASE KNOWLEDGE OF HYPERTENSION Description: Patient will be able to manage HTN with medications and dietary modifications using handouts and educational resources with cues/reminders Outcome: Progressing Goal: RH STG INCREASE KNOWLEGDE OF HYPERLIPIDEMIA Description: Patient will be able to manage HLD with medications and dietary modifications using handouts and educational resources with cues/reminders Outcome: Progressing Goal: RH STG INCREASE KNOWLEDGE OF STROKE PROPHYLAXIS Description: Patient will be able to manage secondary stroke risks with medications and dietary modifications using handouts and educational resources with cues/reminders Outcome: Progressing

## 2020-07-06 NOTE — Progress Notes (Signed)
Occupational Therapy Session Note  Patient Details  Name: Jacqueline Bonilla MRN: [id number] Date of Birth: Apr 21, 1955  Today's Date: 07/06/2020 OT Individual Time: 9242-6834 OT Individual Time Calculation (min): 44 min    Short Term Goals: Week 1:  OT Short Term Goal 1 (Week 1): Pt will complete all bathing sit to stand with supervision for two consecutive sessions. OT Short Term Goal 2 (Week 1): Pt will complete all dressing sit to stand with close supervision for two consecutive sessions. OT Short Term Goal 3 (Week 1): Pt will complete toilet transfers ambulating with LRAD with supervision. OT Short Term Goal 4 (Week 1): Pt will maintain selective attention to therapy tasks with no more than min instructional cueing for redirection.  Skilled Therapeutic Interventions/Progress Updates:    Pt in recliner to start session, agreeable to completion of selfcare tasks and showering.  She was able to ambulate in the room with supervision to gather clothing and complete all transfer to the shower.  She was able to bathe sit to stand as well with supervision and min questioning cueing.  She was able to complete drying off in standing at supervision to challenge her balance as well as UB dressing.  She was able to complete LB dressing sit to stand with supervision.  Grooming tasks were also completed at supervision for oral hygiene and for brushing her hair.  Finished session with functional mobility in the room without assistive device in order to pick up paper towels from the floor.  She was able to complete with supervision.  Also had her work on toe taps in standing with use of the step stool.  Min guard assist for balance to complete two sets of 10 reps.  She was left sitting in the recliner with the call button and phone in reach and safety belt in place.     Therapy Documentation Precautions:  Precautions Precautions: Fall Precaution Comments: Apraxia, LLE Hemiplegia Restrictions Weight Bearing  Restrictions: No  Pain: Pain Assessment Pain Scale: Faces Pain Score: 0-No pain Faces Pain Scale: No hurt ADL: See Care Tool Section for some details of mobility and selfcare  Therapy/Group: Individual Therapy  Maliki Gignac OTR/L 07/06/2020, 12:16 PM

## 2020-07-06 NOTE — Progress Notes (Signed)
Patient ID: Jacqueline Bonilla, female   DOB: 08/12/1955, 65 y.o.   MRN: [id number] Team Conference Report to Patient/Family  Team Conference discussion was reviewed with the patient and caregiver, including goals, any changes in plan of care and target discharge date.  Patient and caregiver express understanding and are in agreement.  The patient has a target discharge date of 07/08/20.  SW met with pt. Provided conference updates. Pt please about d/c on Friday. Sw will follow up with recommendations, no additional questions or concerns  Jacqueline Bonilla 07/06/2020, 1:51 PM

## 2020-07-06 NOTE — Progress Notes (Signed)
Physical Therapy Session Note  Patient Details  Name: Jacqueline Bonilla MRN: [id number] Date of Birth: [birth year]  Today's Date: 07/06/2020 PT Individual Time: 1103-1203 PT Individual Time Calculation (min): 60 min   Short Term Goals: Week 1:  PT Short Term Goal 1 (Week 1): Pt will perform stand pivot transfer with CGA to progress towards goal of supervision PT Short Term Goal 2 (Week 1): Pt will ambulate 50 ft CGA to progress towards goal of supervision with RW at 183ft PT Short Term Goal 3 (Week 1): Pt will consistently perform STS without cuing for body spatial awareness and controlled descent in order to increase safety/independence with transitions to a seated position  Skilled Therapeutic Interventions/Progress Updates:    Pt received sitting in recliner and agreeable to therapy session. Therapist provided pt with fall risk education and when to call 911 - therapist assisted pt in writing this in her memory notebook for improved recall - had pt recall this information during session with pt recalling 50% without cuing and then able to remember other half with min cuing. Sit<>stands, no AD, with supervision for safety during session. Gait training ~269ft to main therapy gym, no AD, with close supervision for safety - had pt navigate down to gym requiring cuing 1x to push down button instead of up button for elevator and pt turned R instead of L requiring question cuing to identify error in navigation, educated pt on importance of remaining aware of her surroundings. Educated on floor transfer technique and safety - had pt perform floor transfer 2x with CGA for safety - requires cuing for sequencing safe transition into sitting in chair as pt initially comes to stand and has to turn 180degrees to sit down. Stair navigation training using 1 HR progressed to no HR in preparation for home (husband currently putting up handrails) 4 steps x 8 trials in block then random practice during session with CGA  for steadying - cuing for reciprocal stepping pattern on ascent for NMR and step-to pattern on descent for safety, pt tends to side step down facing towards R, cuing not to cross feet on descent.  Pt participated in the below exercises in preparation for HEP with therapist providing cuing on proper technique, safe set-up, and need to have husband's assist for safety. Provided HEP printout.  Access Code: [email or web address] URL: [email or web address] Date: 07/06/2020 Prepared by: Page Spiro  Exercises Sit to Stand with Arms Crossed - 1 x daily - 7 x weekly - 2 sets - 12 reps Standing Forward Step Taps with Counter Support - 1 x daily - 7 x weekly - 2 sets - 20 reps Staggered Stance Forward Backward Weight Shift with Counter Support - 1 x daily - 7 x weekly - 3 sets - 10 reps Side Stepping with Resistance at Thighs and Counter Support - 1 x daily - 7 x weekly - 2 sets - 10 reps Walking - 1 x daily - 7 x weekly - 3 sets - 5 minutes of walking hold Stair Negotiation with Single Rail Using Step-Through Pattern (Foot Over Foot) - 1 x daily - 7 x weekly - 2 sets - 4 reps  Gait training ~232ft back to room, no AD, with supervision for safety - no instances of L toe catching or instability. Pt reports need to use bathroom. Standing with supervision for balance safety managed LB clothing without assist. Pt left seated on toilet with NT present to assume care of patient.   Therapy Documentation Precautions:  Precautions Precautions:  Fall Precaution Comments: Apraxia, LLE Hemiplegia Restrictions Weight Bearing Restrictions: No   Pain:   Denies pain during session.   Therapy/Group: Individual Therapy  Tawana Scale , PT, DPT, NCS, CSRS 07/06/2020, 7:52 AM

## 2020-07-06 NOTE — Progress Notes (Signed)
Occupational Therapy Session Note  Patient Details  Name: Jacqueline Bonilla MRN: [id number] Date of Birth: [birth year]  Today's Date: 07/06/2020 OT Group Time: 1330-1430 OT Group Time Calculation (min): 60 min   Short Term Goals: Week 1:  OT Short Term Goal 1 (Week 1): Pt will complete all bathing sit to stand with supervision for two consecutive sessions. OT Short Term Goal 2 (Week 1): Pt will complete all dressing sit to stand with close supervision for two consecutive sessions. OT Short Term Goal 3 (Week 1): Pt will complete toilet transfers ambulating with LRAD with supervision. OT Short Term Goal 4 (Week 1): Pt will maintain selective attention to therapy tasks with no more than min instructional cueing for redirection.  Skilled Therapeutic Interventions/Progress Updates:   Pt was seen for skilled group session with focus of group session on standing tolerance, dynamic reaching, BUE strength, household distance functional mobility, and functional reaching to floor to retrieve bowling ball/ pins during therapeutic activity of bowling. Pt required overall CGA to complete above activities with no AD. Pt reported no pain during session, but did report fatigue requesting to skip her turn to bowl x2. Education provided on energy conservation during other therapeutic activities and provided education on ways to modify tasks as well as listening to your body to save energy as needed. Pt receptive and appreciative of education. Pt returned to room via RT.     Therapy Documentation Precautions:  Precautions Precautions: Fall Precaution Comments: Apraxia, LLE Hemiplegia Restrictions Weight Bearing Restrictions: No    Therapy/Group: Group Therapy  Precious Haws 07/06/2020, 3:33 PM

## 2020-07-06 NOTE — Progress Notes (Signed)
Recreational Therapy Session Note  Patient Details  Name: Jacqueline Bonilla MRN: [id number] Date of Birth: 11-23-1955 Today's Date: 07/06/2020  Pain: no c/o Skilled Therapeutic Interventions/Progress Updates: Pt participated in Therapeutic Activities Group with an emphasis on activity tolerance, dynamic standing balance, household ambulation without AD, problem solving through obstacles, UE use, education on energy conservation/safety awareness, self monitoring and relaxation.  Pt ambulated throughout homelike environment with close supervision, stood using BUEs to bowl during group session, retrieving the ball from the floor using BUEs with contact guard assist.  Pt also stood writing scores on scorecard with supervision and calculated scores at the end with min cues.  Pt c/o fatigue the last 10 minutes needing a longer restbreatks to recover.  Pt enjoyed the group and shared that she was more tired from the activity than expected.  Therapy/Group: Group Therapy   Xyler Terpening 07/06/2020, 3:28 PM

## 2020-07-06 NOTE — Patient Care Conference (Signed)
Inpatient RehabilitationTeam Conference and Plan of Care Update Date: 07/06/2020   Time: 10:45 AM    Patient Name: Jacqueline Bonilla      Medical Record Number: [id number]  Date of Birth: 05-04-1955 Sex: Female         Room/Bed: 5C08C/5C08C-01 Payor Info: Payor: [employer] MEDICARE / Plan: [place] HMO / Product Type: *No Product type* /    Admit Date/Time:  06/28/2020  3:42 PM  Primary Diagnosis:  CVA (cerebral vascular accident) [facility])  Hospital Problems: Principal Problem:   CVA (cerebral vascular accident) [facility]) Active Problems:   Vascular dementia without behavioral disturbance ([place])   Acute left-sided weakness    Expected Discharge Date: Expected Discharge Date: 07/08/20  Team Members Present: Physician leading conference: Dr. Alysia Penna Care Coodinator Present: Dorien Chihuahua, RN, BSN, CRRN;Christina Sampson Goon, [place] Nurse Present: Dorien Chihuahua, RN PT Present: Page Spiro, PT OT Present: Clyda Greener, OT SLP Present: Other (comment) Herbert Deaner, SLP) PPS Coordinator present : Gunnar Fusi, SLP     Current Status/Progress Goal Weekly Team Focus  Bowel/Bladder             Swallow/Nutrition/ Hydration             ADL's   Supervsion overall for bathing and dressing with supervision to min guard for transfers without use of an assistive device.  Still with visual perceptual and motor planning deficits from previous CVA.  Decreased RUE and LUE coordination overall but uses at a dominant and non-dominant level  supervision voerall  selfcare retraining, transfer training, balance retraining, DME education, pt education, neuromuscular re-education   Mobility   supervision bed mobility, supervision sit<>stand and stand pivot tranfers without AD, CGA progressing towards supervision gait >359ft without AD, CGA/min assist 12 steps using R HR  supervision overall  L LE NMR, dynamic standing balance, pt/family education, dynamic gait training, activity tolerance,  stair navigation   Communication             Safety/Cognition/ Behavioral Observations  supervision-to-min A for problem solving, min A for recall  Supervision  use of memory strategies, complex problem solving, confidence in decision making   Pain             Skin               Discharge Planning:  Patient plans to discharge back home. Spouse can provide 24/7 supervision   Team Discussion: Patient doing well overall and on target to meet supervision goals.  Patient on target to meet rehab goals: yes, currently supervision to min assist for problem solving, recall. At baseline cognitive functional level with goals for supervision at discharge for communication. Mod I for upper body bathing and dressing and supervision for lower body care. Requires supervision for transfers without an assistive device. Right upper extremity impaired; functional use diminished level with motor planning issues and visual perceptual issues. Patient able to complete sit - stand with supervision and ambulate 300' but requires min assist for steps due to motor planning issues.  *See Care Plan and progress notes for long and short-term goals.   Revisions to Treatment Plan:  Stumbling addressed with E-stim device/muscle activation for gait Working on word finding tasks   Teaching Needs: Safety, step management, medications, secondary stroke risk management, etc.  Current Barriers to Discharge: Decreased caregiver support  Possible Resolutions to Barriers: Family education with spouse     Medical Summary Current Status: Patient complains of some bluish discoloration at the nailbeds bilaterally in the  fingers.  No pain.  Ambulating more than 100 feet  Barriers to Discharge: Other (comments)  Barriers to Discharge Comments: Gait stability, fear of falling Possible Resolutions to Barriers/Weekly Focus: Plan discharge this week would recommend modified independent in room prior to discharge to improve  confidence   Continued Need for Acute Rehabilitation Level of Care: The patient requires daily medical management by a physician with specialized training in physical medicine and rehabilitation for the following reasons: Direction of a multidisciplinary physical rehabilitation program to maximize functional independence : Yes Medical management of patient stability for increased activity during participation in an intensive rehabilitation regime.: Yes Analysis of laboratory values and/or radiology reports with any subsequent need for medication adjustment and/or medical intervention. : Yes   I attest that I was present, lead the team conference, and concur with the assessment and plan of the team.   Dorien Chihuahua B 07/06/2020, 11:05 AM

## 2020-07-06 NOTE — Progress Notes (Signed)
PROGRESS NOTE   Subjective/Complaints:   Finger tips were bluish last noc prior to dinner, pt felt cold, no finger pain   ROS: Patient denies CP, SOB, N/V/D  Objective:   No results found. Recent Labs    07/04/20 0502  WBC 5.5  HGB 11.4*  HCT 35.9*  PLT 254    Recent Labs    07/04/20 0502  NA 140  K 4.2  CL 105  CO2 26  GLUCOSE 100*  BUN 26*  CREATININE 1.00  CALCIUM 9.3     Intake/Output Summary (Last 24 hours) at 07/06/2020 0839 Last data filed at 07/06/2020 0759 Gross per 24 hour  Intake 594 ml  Output --  Net 594 ml         Physical Exam: Vital Signs Blood pressure (!) 142/78, pulse 85, temperature 97.8 F (36.6 C), temperature source Oral, resp. rate 18, height 5' 4"  (1.626 m), last menstrual period 01/02/2003, SpO2 100 %.   General: No acute distress Mood and affect are appropriate Heart: Regular rate and rhythm no rubs murmurs or extra sounds Lungs: Clear to auscultation, breathing unlabored, no rales or wheezes Abdomen: Positive bowel sounds, soft nontender to palpation, nondistended Extremities: No clubbing, mild cyanosis finger tips toenails painted, or edema, no tenderness, radial and DP pulses strong , fingers and toes cool  Skin: No evidence of breakdown, no evidence of rash   Neurologic:  motor strength is 5/5 in bilateral deltoid, bicep, tricep, grip, hip flexor, knee extensors, 5/5 RIght and 4- left ankle dorsiflexor and plantar flexor  Cerebellar exam normal finger to nose to finger as well as heel to shin in bilateral upper and lower extremities Musculoskeletal: Full range of motion in all 4 extremities. No joint swelling      Assessment/Plan: 1. Functional deficits which require 3+ hours per day of interdisciplinary therapy in a comprehensive inpatient rehab setting. Physiatrist is providing close team supervision and 24 hour management of active medical problems listed  below. Physiatrist and rehab team continue to assess barriers to discharge/monitor patient progress toward functional and medical goals  Care Tool:  Bathing    Body parts bathed by patient: Right arm, Left arm, Chest, Abdomen, Front perineal area, Buttocks, Right upper leg, Face, Left lower leg, Left upper leg, Right lower leg         Bathing assist Assist Level: Set up assist     Upper Body Dressing/Undressing Upper body dressing   What is the patient wearing?: Pull over shirt    Upper body assist Assist Level: Set up assist    Lower Body Dressing/Undressing Lower body dressing      What is the patient wearing?: Pants, Underwear/pull up     Lower body assist Assist for lower body dressing: Supervision/Verbal cueing     Toileting Toileting    Toileting assist Assist for toileting: Supervision/Verbal cueing     Transfers Chair/bed transfer  Transfers assist     Chair/bed transfer assist level: Contact Guard/Touching assist     Locomotion Ambulation   Ambulation assist      Assist level: Contact Guard/Touching assist Assistive device: No Device Max distance: >1,046f   Walk 10 feet activity  Assist     Assist level: Contact Guard/Touching assist Assistive device: No Device   Walk 50 feet activity   Assist Walk 50 feet with 2 turns activity did not occur: Safety/medical concerns (Per PT eval ambulated only 30 feet)  Assist level: Contact Guard/Touching assist Assistive device: No Device    Walk 150 feet activity   Assist Walk 150 feet activity did not occur: Safety/medical concerns  Assist level: Contact Guard/Touching assist Assistive device: No Device    Walk 10 feet on uneven surface  activity   Assist Walk 10 feet on uneven surfaces activity did not occur: Safety/medical concerns   Assist level: Minimal Assistance - Patient > 75%     Wheelchair     Assist Will patient use wheelchair at discharge?: No   Wheelchair  activity did not occur: N/A         Wheelchair 50 feet with 2 turns activity    Assist    Wheelchair 50 feet with 2 turns activity did not occur: N/A       Wheelchair 150 feet activity     Assist  Wheelchair 150 feet activity did not occur: N/A       Blood pressure (!) 142/78, pulse 85, temperature 97.8 F (36.6 C), temperature source Oral, resp. rate 18, height 5' 4"  (1.626 m), last menstrual period 01/02/2003, SpO2 100 %.  Medical Problem List and Plan: 1.  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 Formulized individual treatment plan based on medical history, underlying problem and comorbidities.  2.  Antithrombotics: -DVT/anticoagulation:  Pharmaceutical: Lovenox- may discontinue , walking >100'             -antiplatelet therapy: DAPT X 3 weeks followed by ASA alone.  3. Pain Management: N/A 4. Mood: LCSW to follow for evaluation and support.              -antipsychotic agents: N/a 5. Neuropsych: This patient may not be fully capable of making decisions on her own behalf. 6. Skin/Wound Care: Routine pressure relief measures. 7. Fluids/Electrolytes/Nutrition: Monitor I/O. Check lytes in am. 8. HTN: Monitor BP tid--continue Cozaar. Vitals:   07/05/20 1916 07/06/20 0446  BP: (!) 162/82 (!) 142/78  Pulse: 96 85  Resp: 18 18  Temp: 98.7 F (37.1 C) 97.8 F (36.6 C)  SpO2: 99% 100%  Resume Cozaar 9. H/o thrombocytopenia: Recheck CBC in am as now on DAPT- plt nl 254K 11. Dyslipidemia: Continue Lipitor.     LOS: 8 days A FACE TO FACE EVALUATION WAS PERFORMED  Charlett Blake 07/06/2020, 8:39 AM

## 2020-07-06 NOTE — Progress Notes (Signed)
Recreational Therapy Discharge Summary Patient Details  Name: Jacqueline Bonilla MRN: [id number] Date of Birth: [birth year] Today's Date: 07/06/2020  Long term goals set: 1  Long term goals met: 1  Comments on progress toward goals: Pt is making great progress during LOS and is scheduled for discharge home with husband at supervision ambulatory level 7/8.  TR sessions focused on leisure education, activity analysis/modifications, coping strategies, dynamic standing balance, problem solving through obstacles, energy conservation, and self monitoring.  Pt met Min assist level for TR tasks.  Goal met.  Reasons goals not met: n/a  Equipment acquired: n/a  Reasons for discharge: discharge from hospital   Patient/family agrees with progress made and goals achieved: Yes  Telissa Palmisano 07/06/2020, 3:34 PM

## 2020-07-07 ENCOUNTER — Ambulatory Visit (INDEPENDENT_AMBULATORY_CARE_PROVIDER_SITE_OTHER): Payer: Medicare HMO

## 2020-07-07 DIAGNOSIS — I63321 Cerebral infarction due to thrombosis of right anterior cerebral artery: Secondary | ICD-10-CM | POA: Diagnosis not present

## 2020-07-07 MED ORDER — CLOPIDOGREL BISULFATE 75 MG PO TABS: 75.0000 mg | ORAL_TABLET | Freq: Every day | ORAL | 0 refills | Status: DC

## 2020-07-07 MED ORDER — ATORVASTATIN CALCIUM 40 MG PO TABS: 40.0000 mg | ORAL_TABLET | Freq: Every day | ORAL | 0 refills | Status: DC

## 2020-07-07 NOTE — Discharge Summary (Signed)
Physician Discharge Summary  Patient ID: Jacqueline Bonilla MRN: [id number] DOB/AGE: 1955-09-26 65 y.o.  Admit date: 06/28/2020 Discharge date: Years 07/08/2020  Discharge Diagnoses:  Principal Problem:   CVA (cerebral vascular accident) [facility]) Active Problems:   Essential hypertension   Vascular dementia without behavioral disturbance ([place])   Acute left-sided weakness   Azotemia   Anemia   Discharged Condition: good  Significant Diagnostic Studies: N/a   Labs:  Basic Metabolic Panel: BMP Latest Ref Rng & Units 07/04/2020 06/29/2020 06/24/2020  Glucose 70 - 99 mg/dL 100(H) 89 95  BUN 8 - 23 mg/dL 26(H) 27(H) 19  Creatinine 0.44 - 1.00 mg/dL 1.00 0.94 1.10(H)  BUN/Creat Ratio 12 - 28 - - -  Sodium 135 - 145 mmol/L 140 137 139  Potassium 3.5 - 5.1 mmol/L 4.2 3.8 4.1  Chloride 98 - 111 mmol/L 105 104 102  CO2 22 - 32 mmol/L 26 29 -  Calcium 8.9 - 10.3 mg/dL 9.3 9.0 -     CBC: CBC Latest Ref Rng & Units 07/04/2020 06/29/2020 06/24/2020  WBC 4.0 - 10.5 K/uL 5.5 5.1 -  Hemoglobin 12.0 - 15.0 g/dL 11.4(L) 11.6(L) 14.3  Hematocrit 36.0 - 46.0 % 35.9(L) 35.5(L) 42.0  Platelets 150 - 400 K/uL 254 220 -     CBG: No results for input(s): GLUCAP in the last 168 hours.  Brief HPI:   Jacqueline Bonilla is a 65 y.o. 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  She had waxing and waning per EMS but refused tPA.  MRI/MRA brain done revealing punctate infarct and high "report except faint areas of DWI signal in bilateral precentral gyri question acute/subacute infarct or artifact.  Stroke felt to be embolic recorder was interrogated with negative for A. fib.  Dr. Erlinda Hong recommended aspirin/Plavix x3 weeks followed by Plavix alone.  Patient continued to be limited by left-sided  weakness with apraxia as well as premorbid mild cognitive deficits with moderate word finding issues.  CIR was recommended due to functional decline.   Hospital Course: ESTEFANI BATESON was admitted to rehab 06/28/2020 for inpatient therapies to consist of PT, ST and OT at least three hours five days a week. Past admission physiatrist, therapy team and rehab RN have worked together to provide customized collaborative inpatient rehab. .  Blood pressures were monitored on 3 times daily basis and were noted to be trending upwards therefore Cozaar was resumed.  Follow-up CBC showed platelets to be within normal limits with mild drop in H&H noted without signs of healing.  Check of electrolytes showed evidence of prerenal azotemia and she was encouraged to increase fluid intake.  Her mood has been stable without unsafe behaviors and she has made steady progress during her stay.She continues to have decreased coordination in lower extremity as well as premorbid issues with RUE.  Supervision is recommended for safety.  She will continue to receive follow-up outpatient therapy after discharge   Rehab course: During patient's stay in rehab weekly team conferences were held to monitor patient's progress, set goals and discuss barriers to discharge. At admission, patient required min assist with mobility and ADL tasks.  She was found to have mild cognitive deficits with slums score 21 out of 30 and reported occasional word finding difficulty. She  has had improvement in  activity tolerance, balance, postural control as well as ability to compensate for deficits.  She is able to complete ADL tasks with supervision.  She requires supervision for transfers and is able to ambulate up to 8000 feet without an assistive device. She requires supervision for basic cognitive tasks and min assist with high-level problem-solving. She is able to complete tasks with 80% accuracy and supervision with cues to identify  errors.  Discharge disposition: 01-Home or Self Care  Diet: Heart healthy  Special Instructions: No strenuous activity or gym workouts till cleared by PT. Recommend repeat CBC and be met in 1 to 2 weeks to monitor renal status as well as H&H. Stop aspirin after 8 days.  Discharge Instructions     Ambulatory referral to Physical Medicine Rehab   Complete by: As directed    TC appt in 1-2 weeks.      Allergies as of 07/08/2020       Reactions   Avelox [moxifloxacin]    Ciprofloxacin Hives   Floraquin [iodoquinol]    No cipro or others   Iohexol     Code: HIVES, Desc: HIVES S/P IVP MANY YRS AGO...NO PROBLEM W/O PREP TODAY/A.[place], Onset Date: [id number]   Levaquin [levofloxacin]    Quinolones    Shellfish Allergy Itching   Itching under the chin. Described by patient but not seen by husband.        Medication List     TAKE these medications    aspirin EC 81 MG tablet Take 1 tablet (81 mg total) by mouth daily. Swallow whole. Notes to patient: Stop Aspirin after 8 days   atorvastatin 40 MG tablet Commonly known as: LIPITOR Take 1 tablet (40 mg total) by mouth daily.   clopidogrel 75 MG tablet Commonly known as: PLAVIX Take 1 tablet (75 mg total) by mouth daily.   losartan 25 MG tablet Commonly known as: Cozaar Take 1 tablet (25 mg total) by mouth daily. What changed: when to take this   VITAMIN B-1 PO Take 1 tablet by mouth daily.   VITAMIN C PO Take 1 tablet by mouth daily.   VITAMIN D3 GUMMIES PO Take 1,000 mcg by mouth daily.   ZINC PO Take 1 tablet by mouth daily.        Follow-up Information     Kirsteins, Luanna Salk, MD Follow up.   Specialty: Physical Medicine and Rehabilitation Why: office will call you with follow up appointment Contact information: [name] [address] [phone]         Horald Pollen, MD. Call on 07/11/2020.   Specialty: Internal Medicine Why: for post hospital follow  up Contact information: [name] [address] [phone]         [place]. Call on 07/11/2020.   Why: for post stroke follow up Contact information: [address]     [place] [id number] [phone]                Signed: Bary Leriche 07/11/2020, 9:33 AM

## 2020-07-07 NOTE — Progress Notes (Signed)
Physical Therapy Discharge Summary  Patient Details  Name: Jacqueline Bonilla MRN: [id number] Date of Birth: 27-Feb-1955  Today's Date: 07/07/2020 PT Individual Time: 1309-1408 PT Individual Time Calculation (min): 59 min    Patient has met 8 of 8 long term goals due to improved activity tolerance, improved balance, improved postural control, increased strength, functional use of  left lower extremity, improved attention, improved awareness, and improved coordination.  Patient to discharge at an ambulatory level Supervision without AD.   Patient's care partner is independent to provide the necessary physical and cognitive assistance at discharge.  All goals met.  Recommendation:  Patient will benefit from ongoing skilled PT services in outpatient setting to continue to advance safe functional mobility, address ongoing impairments in higher level dynamic balance, higher level gait training and community reintegration, L LE NMR, and minimize fall risk.  Equipment: No equipment provided, none needed  Reasons for discharge: treatment goals met and discharge from hospital  Patient/family agrees with progress made and goals achieved: Yes  Skilled Therapeutic Interventions/Progress Updates:  Pt received sitting in recliner and agreeable to therapy session. Sit<>stands independently and stand pivot transfers supervision throughout session without AD. Gait training >1,059f indoors on level ground, no  AD, with distant supervision. Gait training >2061f no AD, outside on unlevel ramps, brick pavements, stairs, and curb all with close supervision except for the stairs requiring CGA using R HR support; however, pt demonstrating improved B LE coordination and placement of feet on the stairs compared to in previous sessions. Stair navigation inside hospital stairwells via descending 2 flights and ascending 4 flights of stairs all while using R UE support on HR and close supervision for safety - pt self  selecting step-to pattern on descent for safety and reciprocal pattern on ascent. Simulated car transfer, truck height, with supervision. [facility] demonstrates increased fall risk as noted by score of  49/56.  (<36= high risk for falls, close to 100%; 37-45 significant >80%; 46-51 moderate >50%; 52-55 lower >25%). At end of session pt left in care of NT for toileting.   PT Discharge Precautions/Restrictions Precautions Precautions: Fall;Other (comment) Precaution Comments: Apraxia, LLE paresis (predominantly ankle DF and hamstrings) Restrictions Weight Bearing Restrictions: No Pain Pain Assessment Pain Scale: 0-10 Pain Score: 0-No pain Perception  Perception Perception: Within Functional Limits Praxis Praxis: Intact  Cognition  Overall Cognitive Status: History of cognitive impairments - at baseline Arousal/Alertness: Awake/alert Orientation Level: Oriented X4 Attention: Focused;Sustained Focused Attention: Appears intact Sustained Attention: Appears intact Selective Attention: Appears intact Memory: Impaired Awareness: Appears intact Safety/Judgment: Appears intact Sensation  Sensation Light Touch: Appears Intact (in LEs) Hot/Cold: Not tested Proprioception: Appears Intact Stereognosis: Not tested Coordination Gross Motor Movements are Fluid and Coordinated: Yes Coordination and Movement Description: GM movements in LEs are coordinated with only slight decreased L ankle DF with fatigue after long distance ambulation or during challenging dual-task with gait Motor  Motor Motor: Hemiplegia;Abnormal postural alignment and control;Motor perseverations;Motor apraxia Motor - Discharge Observations: Still with slight left sided decreased coordination in LE and hand compared to normal.  Also with impaired coordination in the RUE as well.  Mobility Bed Mobility Bed Mobility: Supine to Sit;Sit to Supine Supine to Sit: Independent Sit to  Supine: Independent Transfers Transfers: Sit to Stand;Stand to SiLockheed Martinransfers Sit to Stand: Independent Stand to Sit: Independent Stand Pivot Transfers: Supervision/Verbal cueing Locomotion  Gait Ambulation: Yes Gait Assistance: Supervision/Verbal cueing Gait Distance (Feet): 1000 Feet Assistive device: None Gait  Gait: Yes Gait Pattern: Impaired Gait Pattern: Poor foot clearance - left;Decreased dorsiflexion - left;Decreased hip/knee flexion - left Gait velocity: WFL Stairs / Additional Locomotion Stairs: Yes Stairs Assistance: Supervision/Verbal cueing Stair Management Technique: One rail Right;One rail Left Number of Stairs: 24 Height of Stairs: 6 Ramp: Supervision/Verbal cueing Curb: Supervision/Verbal cueing Wheelchair Mobility Wheelchair Mobility: No  Trunk/Postural Assessment  Cervical Assessment Cervical Assessment: Within Functional Limits Thoracic Assessment Thoracic Assessment: Exceptions to [facility] (thoracic kyphosis) Lumbar Assessment Lumbar Assessment: Exceptions to [facility] (posterior pelvic tilt) Postural Control Postural Control: Within Functional Limits  Balance Balance Balance Assessed: Yes Standardized Balance Assessment Standardized Balance Assessment: Berg Balance Test Berg Balance Test Sit to Stand: Able to stand without using hands and stabilize independently Standing Unsupported: Able to stand safely 2 minutes Sitting with Back Unsupported but Feet Supported on Floor or Stool: Able to sit safely and securely 2 minutes Stand to Sit: Sits safely with minimal use of hands Transfers: Able to transfer safely, minor use of hands Standing Unsupported with Eyes Closed: Able to stand 10 seconds safely Standing Ubsupported with Feet Together: Able to place feet together independently and stand 1 minute safely From Standing, Reach Forward with Outstretched Arm: Can reach confidently >25 cm (10") From Standing Position, Pick up Object from Floor: Able  to pick up shoe safely and easily From Standing Position, Turn to Look Behind Over each Shoulder: Looks behind from both sides and weight shifts well Turn 360 Degrees: Able to turn 360 degrees safely but slowly Standing Unsupported, Alternately Place Feet on Step/Stool: Able to stand independently and safely and complete 8 steps in 20 seconds Standing Unsupported, One Foot in Front: Able to take small step independently and hold 30 seconds Standing on One Leg: Tries to lift leg/unable to hold 3 seconds but remains standing independently Total Score: 49 Static Sitting Balance Static Sitting - Level of Assistance: 7: Independent Dynamic Sitting Balance Dynamic Sitting - Balance Support: During functional activity Dynamic Sitting - Level of Assistance: 6: Modified independent (Device/Increase time) Static Standing Balance Static Standing - Balance Support: No upper extremity supported Static Standing - Level of Assistance: 7: Independent Dynamic Standing Balance Dynamic Standing - Balance Support: During functional activity;No upper extremity supported Dynamic Standing - Level of Assistance: 5: Stand by assistance Extremity Assessment  RLE Assessment RLE Assessment: Within Functional Limits Active Range of Motion (AROM) Comments: WFL RLE Strength Right Hip Flexion: 4+/5 Right Hip Extension: 5/5 Right Knee Flexion: 5/5 Right Knee Extension: 5/5 Right Ankle Dorsiflexion: 5/5 Right Ankle Plantar Flexion: 5/5 LLE Assessment LLE Assessment: Exceptions to [facility] Active Range of Motion (AROM) Comments: WFL LLE Strength Left Hip Flexion: 4+/5 Left Knee Flexion: 5/5 Left Knee Extension: 5/5 Left Ankle Dorsiflexion: 3+/5 Left Ankle Plantar Flexion: 4-/5    Tawana Scale , PT, DPT, NCS, CSRS 07/07/2020, 12:48 PM

## 2020-07-07 NOTE — Progress Notes (Signed)
Occupational Therapy Discharge Summary  Patient Details  Name: Jacqueline Bonilla MRN: [id number] Date of Birth: [birth year]  Today's Date: 07/07/2020 OT Individual Time: 0901-1000 OT Individual Time Calculation (min): 59 min   Session Note:  Pt in up recliner with PA present in the room, agreeable to therapy.  Had her ambulate down to the gift shop with supervision and no device with min instructional cueing to use the signs to locate it.  Before reaching the gift shop, therapist instructed her on three items he wanted her to locate.  She was able to recall 2/3 items and then needed a choice of 3 to pick out the 3rd item stated.  Had her sit down outside of the gift shop and again therapist gave her 3 different items to recall.  She was able to recall them after 6-7 min delay and locate them all with increased time and min instructional cueing.  Had her then take therapist back up to the rehab gym using the signs and maps.  She was unable to effectively use the external aides, but was able to determine that she needed to go up the elevators to the floor.  Once on the fourth floor, she needed mod instructional cueing to locate the therapy gym.  Once in the gym, therapist provided a handout for reference regarding FM coordination activities to be completed at home for both the RUE and LUE.  Also provided written name of a sorting activity board that she has been using some in therapy that she likes and has been challenging.  Had her ambulate back to her room with supervision and she was left sitting in the recliner with the call button and phone in reach and safety alarm in place.   Patient has met 14 of 14 long term goals due to improved activity tolerance, improved balance, postural control, ability to compensate for deficits, improved attention, improved awareness, and improved coordination.  Patient to discharge at overall Supervision level.  Patient's care partner is independent to provide the necessary  physical and cognitive assistance at discharge.    Reasons goals not met: NA  Recommendation:  Patient will benefit from ongoing skilled OT services in outpatient setting to continue to advance functional skills in the area of BADL, iADL, and Reduce care partner burden.  Pt continues with RUE hemiparesis secondary to older CVA as well as slight LUE and LLE impairment secondary to this most recent CVA.  She also demonstrates decreased visual perceptual deficits as well as motor planning deficits which are also likely from her previous CVA.  She has made great progress with OT currently, but will continue to benefit from further outpatient OT to increase back to a modified independent level for all ADLs and IADLs.    Equipment: No equipment provided  Reasons for discharge: treatment goals met and discharge from hospital  Patient/family agrees with progress made and goals achieved: Yes  OT Discharge Precautions/Restrictions  Precautions Precautions: Fall Precaution Comments: Apraxia, LLE Hemiplegia Restrictions Weight Bearing Restrictions: No   Pain Pain Assessment Pain Scale: 0-10 Pain Score: 0-No pain ADL ADL Eating: Independent Where Assessed-Eating: Chair Grooming: Independent Where Assessed-Grooming: Standing at sink Upper Body Bathing: Modified independent Where Assessed-Upper Body Bathing: Chair, Shower Lower Body Bathing: Supervision/safety Where Assessed-Lower Body Bathing: Chair, Administrator, sports Dressing: Independent Where Assessed-Upper Body Dressing: Chair Lower Body Dressing: Modified independent Where Assessed-Lower Body Dressing: Chair Toileting: Supervision/safety Where Assessed-Toileting: Glass blower/designer: Close supervision Toilet Transfer Method: Counselling psychologist: Raised  toilet seat, Grab bars Tub/Shower Transfer: Not assessed Social research officer, government: Distant supervision Social research officer, government Method: Conservation officer, historic buildings: Civil engineer, contracting with back Vision Baseline Vision/History: Wears glasses Wears Glasses: At all times Patient Visual Report: No change from baseline Vision Assessment?: No apparent visual deficits Eye Alignment: Within Functional Limits Ocular Range of Motion: Within Functional Limits Alignment/Gaze Preference: Within Defined Limits Tracking/Visual Pursuits: Decreased smoothness of horizontal tracking Convergence: Within functional limits Visual Fields: No apparent deficits Perception  Perception: Within Functional Limits Praxis Praxis: Impaired Praxis Impairment Details: Motor planning Cognition Overall Cognitive Status: History of cognitive impairments - at baseline Arousal/Alertness: Awake/alert Attention: Focused;Sustained;Alternating Focused Attention: Appears intact Sustained Attention: Appears intact Alternating Attention: Impaired Memory: Impaired Memory Impairment: Decreased recall of new information;Decreased short term memory Awareness: Appears intact Problem Solving: Impaired Problem Solving Impairment: Functional basic;Functional complex Reasoning: Appears intact Safety/Judgment: Impaired Comments: Slight impulsivity at times, standing before therapist is beside of her. Sensation Sensation Light Touch: Impaired Detail Light Touch Impaired Details: Impaired RUE Hot/Cold: Appears Intact Proprioception: Appears Intact Stereognosis: Impaired Detail Additional Comments: Pt with slight numbness in the right digits with slight decreased stereognosis in the RUE, where she was only 50% accurate with testing. Coordination Gross Motor Movements are Fluid and Coordinated: No Fine Motor Movements are Fluid and Coordinated: No Coordination and Movement Description: Pt with BUE coordination slightly impaired compared to normal with speed and accuracy with finger to nose testing.  RUE slightly more impaired from previous CVA. 9 Hole Peg Test: R= 41 seconds, L=  37 Motor  Motor Motor: Hemiplegia;Abnormal postural alignment and control;Motor perseverations;Motor apraxia Motor - Discharge Observations: Still with slight left sided decreased coordintiion in the leg and hand compared to normal.  Also with impaired coordination in the RUE as well. Mobility  Bed Mobility Bed Mobility: Supine to Sit;Sit to Supine Supine to Sit: Independent Sit to Supine: Independent Transfers Sit to Stand: Independent Stand to Sit: Independent  Trunk/Postural Assessment  Cervical Assessment Cervical Assessment: Within Functional Limits Thoracic Assessment Thoracic Assessment: Exceptions to [facility] (slight forward shoulder posturing) Lumbar Assessment Lumbar Assessment: Exceptions to [facility] (posterior pelvic tilt)  Balance Balance Balance Assessed: Yes Static Sitting Balance Static Sitting - Balance Support: Feet supported Static Sitting - Level of Assistance: 7: Independent Dynamic Sitting Balance Dynamic Sitting - Balance Support: During functional activity Dynamic Sitting - Level of Assistance: 6: Modified independent (Device/Increase time) Static Standing Balance Static Standing - Balance Support: No upper extremity supported Static Standing - Level of Assistance: 7: Independent Dynamic Standing Balance Dynamic Standing - Balance Support: During functional activity;No upper extremity supported Dynamic Standing - Level of Assistance: 5: Stand by assistance Extremity/Trunk Assessment RUE Assessment RUE Assessment: Exceptions to [facility] Passive Range of Motion (PROM) Comments: WFLS Active Range of Motion (AROM) Comments: WFLS overerall, slight decreased elbow extension noted during shoulder flexion General Strength Comments: Strength 4/5 throughout with slight decreased gross and FM coordination noted with functional use and with finger to nose testing.  History of decreased coordination from previous CVA with pt reporting that she still cannot wrist legibly. LUE  Assessment LUE Assessment: Exceptions to [facility] Passive Range of Motion (PROM) Comments: WFLS Active Range of Motion (AROM) Comments: WFLs General Strength Comments: Strength 4/5 throughout with slight decreased gross and FM coordination noted with functional use and with finger to nose testing but uses functionally at a non-dominant level with selfcare tasks.   Jacqueline Bonilla OTR/L 07/07/2020, 12:41 PM

## 2020-07-07 NOTE — Progress Notes (Signed)
PROGRESS NOTE   Subjective/Complaints:   No finger issues, no other c/os Feels like she is ready to go home tomorrow   ROS: Patient denies CP, SOB, N/V/D, no bowel or bladder issues   Objective:   No results found. No results for input(s): WBC, HGB, HCT, PLT in the last 72 hours.  No results for input(s): NA, K, CL, CO2, GLUCOSE, BUN, CREATININE, CALCIUM in the last 72 hours.   Intake/Output Summary (Last 24 hours) at 07/07/2020 0752 Last data filed at 07/07/2020 0730 Gross per 24 hour  Intake 1068 ml  Output --  Net 1068 ml         Physical Exam: Vital Signs Blood pressure 122/70, pulse 83, temperature 97.9 F (36.6 C), resp. rate 17, height 5\' 4"  (1.626 m), last menstrual period 01/02/2003, SpO2 96 %.   General: No acute distress Mood and affect are appropriate Heart: Regular rate and rhythm no rubs murmurs or extra sounds Lungs: Clear to auscultation, breathing unlabored, no rales or wheezes Abdomen: Positive bowel sounds, soft nontender to palpation, nondistended Extremities: No clubbing, mild cyanosis finger tips toenails painted, or edema, no tenderness, radial and DP pulses strong , fingers and toes cool  Skin: No evidence of breakdown, no evidence of rash   Neurologic:  motor strength is 5/5 in bilateral deltoid, bicep, tricep, grip, hip flexor, knee extensors, 5/5 RIght and 4- left ankle dorsiflexor and plantar flexor  Cerebellar exam normal finger to nose to finger as well as heel to shin in bilateral upper and lower extremities Musculoskeletal: Full range of motion in all 4 extremities. No joint swelling      Assessment/Plan: 1. Functional deficits which require 3+ hours per day of interdisciplinary therapy in a comprehensive inpatient rehab setting. Physiatrist is providing close team supervision and 24 hour management of active medical problems listed below. Physiatrist and rehab team continue  to assess barriers to discharge/monitor patient progress toward functional and medical goals  Care Tool:  Bathing    Body parts bathed by patient: Right arm, Left arm, Chest, Abdomen, Front perineal area, Buttocks, Right upper leg, Face, Left lower leg, Left upper leg, Right lower leg         Bathing assist Assist Level: Set up assist     Upper Body Dressing/Undressing Upper body dressing   What is the patient wearing?: Pull over shirt    Upper body assist Assist Level: Set up assist    Lower Body Dressing/Undressing Lower body dressing      What is the patient wearing?: Pants, Underwear/pull up     Lower body assist Assist for lower body dressing: Supervision/Verbal cueing     Toileting Toileting    Toileting assist Assist for toileting: Supervision/Verbal cueing     Transfers Chair/bed transfer  Transfers assist     Chair/bed transfer assist level: Supervision/Verbal cueing     Locomotion Ambulation   Ambulation assist      Assist level: Supervision/Verbal cueing Assistive device: No Device Max distance: 236ft   Walk 10 feet activity   Assist     Assist level: Supervision/Verbal cueing Assistive device: No Device   Walk 50 feet activity   Assist  Walk 50 feet with 2 turns activity did not occur: Safety/medical concerns (Per PT eval ambulated only 30 feet)  Assist level: Supervision/Verbal cueing Assistive device: No Device    Walk 150 feet activity   Assist Walk 150 feet activity did not occur: Safety/medical concerns  Assist level: Supervision/Verbal cueing Assistive device: No Device    Walk 10 feet on uneven surface  activity   Assist Walk 10 feet on uneven surfaces activity did not occur: Safety/medical concerns   Assist level: Minimal Assistance - Patient > 75%     Wheelchair     Assist Will patient use wheelchair at discharge?: No   Wheelchair activity did not occur: N/A         Wheelchair 50 feet with 2  turns activity    Assist    Wheelchair 50 feet with 2 turns activity did not occur: N/A       Wheelchair 150 feet activity     Assist  Wheelchair 150 feet activity did not occur: N/A       Blood pressure 122/70, pulse 83, temperature 97.9 F (36.6 C), resp. rate 17, height 5\' 4"  (1.626 m), last menstrual period 01/02/2003, SpO2 96 %.  Medical Problem List and Plan: 1.  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  Antithrombotics: -DVT/anticoagulation:  Pharmaceutical: Lovenox- may discontinue , walking >100'             -antiplatelet therapy: DAPT X 3 weeks followed by ASA alone.  3. Pain Management: N/A 4. Mood: LCSW to follow for evaluation and support.              -antipsychotic agents: N/a 5. Neuropsych: This patient may not be fully capable of making decisions on her own behalf. 6. Skin/Wound Care: Routine pressure relief measures. 7. Fluids/Electrolytes/Nutrition: Monitor I/O. Check lytes in am. 8. HTN: Monitor BP tid--continue Cozaar. Vitals:   07/06/20 1934 07/07/20 0521  BP: 138/88 122/70  Pulse: (!) 105 83  Resp: 17 17  Temp: 98 F (36.7 C) 97.9 F (36.6 C)  SpO2: 100% 96%  Resume Cozaar 9. H/o thrombocytopenia: Recheck CBC in am as now on DAPT- plt nl 254K 11. Dyslipidemia: Continue Lipitor.     LOS: 9 days A FACE TO Jacqueline Bonilla 07/07/2020, 7:52 AM

## 2020-07-07 NOTE — Discharge Instructions (Addendum)
Inpatient Rehab Discharge Instructions  Jacqueline Bonilla Discharge date and time: 07/08/20   Activities/Precautions/ Functional Status: Activity: no lifting, driving, or strenuous exercise till cleared by MD Diet: cardiac diet Wound Care: none needed   Functional status:  ___ No restrictions     ___ Walk up steps independently _X__ 24/7 supervision/assistance   ___ Walk up steps with assistance ___ Intermittent supervision/assistance  ___ Bathe/dress independently ___ Walk with walker     ___ Bathe/dress with assistance ___ Walk Independently    ___ Shower independently ___ Walk with assistance    _X__ Shower with assistance _X__ No alcohol     ___ Return to work/school ________  COMMUNITY REFERRALS UPON DISCHARGE:    Outpatient: PT     OT    ST                Agency:[facility] [phone]              Appointment Date/Time:Facility to Schedule   Medical Equipment/Items Ordered: NO DME                                                  Special Instructions:  STROKE/TIA DISCHARGE INSTRUCTIONS SMOKING Cigarette smoking nearly doubles your risk of having a stroke & is the single most alterable risk factor  If you smoke or have smoked in the last 12 months, you are advised to quit smoking for your health. Most of the excess cardiovascular risk related to smoking disappears within a year of stopping. Ask you doctor about anti-smoking medications [place] Quit Line: 1-800-QUIT NOW Free Smoking Cessation Classes [phone]  CHOLESTEROL Know your levels; limit fat & cholesterol in your diet  Lipid Panel     Component Value Date/Time   CHOL 164 06/25/2020 0447   CHOL 173 06/11/2019 1431   TRIG 56 06/25/2020 0447   HDL 45 06/25/2020 0447   HDL 62 06/11/2019 1431   CHOLHDL 3.6 06/25/2020 0447   VLDL 11 06/25/2020 0447   LDLCALC 108 (H) 06/25/2020 0447   LDLCALC 93 06/11/2019 1431     Many patients benefit from treatment even if their  cholesterol is at goal. Goal: Total Cholesterol (CHOL) less than 160 Goal:  Triglycerides (TRIG) less than 150 Goal:  HDL greater than 40 Goal:  LDL (LDLCALC) less than 100   BLOOD PRESSURE American Stroke Association blood pressure target is less that 120/80 mm/Hg  Your discharge blood pressure is:  BP: 122/70 Monitor your blood pressure Limit your salt and alcohol intake Many individuals will require more than one medication for high blood pressure  DIABETES (A1c is a blood sugar average for last 3 months) Goal HGBA1c is under 7% (HBGA1c is blood sugar average for last 3 months)  Diabetes:     Lab Results  Component Value Date   HGBA1C 5.8 (H) 06/25/2020    Your HGBA1c can be lowered with medications, healthy diet, and exercise. Check your blood sugar as directed by your physician Call your physician if you experience unexplained or low blood sugars.  PHYSICAL ACTIVITY/REHABILITATION Goal is 30 minutes at least 4 days per week  Activity: No driving, Therapies: see above Return to work:  Activity decreases your risk of heart attack and stroke and makes your heart stronger.  It helps control your weight and blood pressure; helps you relax  and can improve your mood. Participate in a regular exercise program. Talk with your doctor about the best form of exercise for you (dancing, walking, swimming, cycling).  DIET/WEIGHT Goal is to maintain a healthy weight  Your discharge diet is:  Diet Order             Diet Heart Room service appropriate? Yes; Fluid consistency: Thin  Diet effective now                   liquids Your height is:  Height: 5\' 4"  (162.6 cm) Your current weight is:   Your Body Mass Index (BMI) is:    Following the type of diet specifically designed for you will help prevent another stroke. Your goal weight range is:   Your goal Body Mass Index (BMI) is 19-24. Healthy food habits can help reduce 3 risk factors for stroke:  High cholesterol, hypertension, and  excess weight.  RESOURCES Stroke/Support Group:  Call [phone]   STROKE EDUCATION PROVIDED/REVIEWED AND GIVEN TO PATIENT Stroke warning signs and symptoms How to activate emergency medical system (call 911). Medications prescribed at discharge. Need for follow-up after discharge. Personal risk factors for stroke. Pneumonia vaccine given:  Flu vaccine given:  My questions have been answered, the writing is legible, and I understand these instructions.  I will adhere to these goals & educational materials that have been provided to me after my discharge from the hospital.      My questions have been answered and I understand these instructions. I will adhere to these goals and the provided educational materials after my discharge from the hospital.  Patient/Caregiver Signature _______________________________ Date __________  Clinician Signature _______________________________________ Date __________  Please bring this form and your medication list with you to all your follow-up doctor's appointments.

## 2020-07-07 NOTE — Plan of Care (Signed)
  Problem: RH Problem Solving Goal: LTG Patient will demonstrate problem solving for (SLP) Description: LTG:  Patient will demonstrate problem solving for basic/complex daily situations with cues  (SLP) Outcome: Completed/Met   Problem: RH Memory Goal: LTG Patient will use memory compensatory aids to (SLP) Description: LTG:  Patient will use memory compensatory aids to recall biographical/new, daily complex information with cues (SLP) Outcome: Completed/Met   Problem: RH Attention Goal: LTG Patient will demonstrate this level of attention during functional activites (SLP) Description: LTG:  Patient will will demonstrate this level of attention during functional activites (SLP) Outcome: Completed/Met   

## 2020-07-07 NOTE — Progress Notes (Signed)
Speech Language Pathology Discharge Summary  Patient Details  Name: Jacqueline Bonilla MRN: [id number] Date of Birth: 1955/06/21  Today's Date: 07/07/2020 SLP Individual Time: 60 minutes  Skilled Therapeutic Interventions:  Skilled ST intervention performed with focus on cognitive goals and education with spouse, who was present during session. SLP facilitated mildly complex medication management task (continuation of task from yesterday's session) involving ID medication organization errors in weekly am/pm pillbox, and generating solutions to repair organization errors. Patient completed task with 80% accuracy and supervision A verbal and visual cues for identifying errors and visual scanning which appeared to occur mainly in the lower quadrant of page. Improvement compared to yesterday's performance. Patient able to generate solutions after errors were identified with independence. Also facilitated mildly complex verbal reasoning task with Scattergories game (naming items with initial letter within selected category). Patient completed at supervision level A semantic cues and additional processing time. Improved timing with patient preferred categories including food and scents. Provided education to patient and spouse on current function, and considerations to further promote cognitive function at home.   Patient has met 3 of 3 long term goals.  Patient to discharge at overall Supervision level.  Reasons goals not met: NA   Clinical Impression/Discharge Summary:  Patient has made consistent gains and has met 3 of 3 long term goals this admission. Patient is currently at overall supervision A level for basic cognitive tasks and min A for higher level problem solving, executive functions, and alternating attention. Patient and family education is complete and patient will discharge home with 24 hour supervision from family. Patient may benefit from OP f/u SLP services to further maximize cognitive  functions and improve functional independence.  Care Partner:  Caregiver Able to Provide Assistance: Yes  Type of Caregiver Assistance: Cognitive;Physical  Recommendation:  Outpatient SLP  Rationale for SLP Follow Up: Maximize cognitive function and independence   Equipment: NA   Reasons for discharge: Treatment goals met   Patient/Family Agrees with Progress Made and Goals Achieved: Yes    Deaja Rizo T Alichia Alridge 07/07/2020, 5:00 PM

## 2020-07-08 NOTE — Progress Notes (Signed)
INPATIENT REHABILITATION DISCHARGE NOTE   Discharge instructions by: Reesa Chew  Verbalized understanding: Yes  Skin care/Wound care: no wounds  Pain: no pain  IV's: no IV's  Tubes/Drains: none  Safety instructions: fall risk  Patient belongings: sent home with patient  Discharged to: home with husband  Discharged via: private car   Notes:

## 2020-07-08 NOTE — Progress Notes (Signed)
Inpatient Rehabilitation Care Coordinator Discharge Note  The overall goal for the admission was met for:   Discharge location: Yes, home  Length of Stay: Yes, 10 Days  Discharge activity level: Yes, ambulatory level Supervision without AD  Home/community participation: Yes  Services provided included: MD, RD, PT, OT, SLP, RN, CM, TR, Pharmacy, and SW  Financial Services: Private Insurance: [facility]  Choices offered to/list presented [id number]   [facility] arranged[facility]  Comments (or additional information): PT OT ST NO DME  Patient/Family verbalized understanding of follow-up arrangements: Yes  Individual responsible for coordination of the follow-up plan: Patient, [phone]  Confirmed correct DME delivered: [name] 07/08/2020    [name]

## 2020-07-08 NOTE — Progress Notes (Signed)
PROGRESS NOTE   Subjective/Complaints:  Ready for d/c No complaints Asks about medications Discussed medications, f/u  ROS: Patient denies CP, SOB, N/V/D, no bowel or bladder issues   Objective:   No results found. No results for input(s): WBC, HGB, HCT, PLT in the last 72 hours.  No results for input(s): NA, K, CL, CO2, GLUCOSE, BUN, CREATININE, CALCIUM in the last 72 hours.   Intake/Output Summary (Last 24 hours) at 07/08/2020 1039 Last data filed at 07/08/2020 0700 Gross per 24 hour  Intake 715 ml  Output --  Net 715 ml        Physical Exam: Vital Signs Blood pressure 122/77, pulse 90, temperature 97.9 F (36.6 C), resp. rate 17, height 5\' 4"  (1.626 m), last menstrual period 01/02/2003, SpO2 98 %. Gen: no distress, normal appearing HEENT: oral mucosa pink and moist, NCAT Cardio: Reg rate Chest: normal effort, normal rate of breathing Abd: soft, non-distended Extremities: No clubbing, mild cyanosis finger tips toenails painted, or edema, no tenderness, radial and DP pulses strong , fingers and toes cool  Skin: No evidence of breakdown, no evidence of rash   Neurologic:  motor strength is 5/5 in bilateral deltoid, bicep, tricep, grip, hip flexor, knee extensors, 5/5 RIght and 4- left ankle dorsiflexor and plantar flexor  Cerebellar exam normal finger to nose to finger as well as heel to shin in bilateral upper and lower extremities Musculoskeletal: Full range of motion in all 4 extremities. No joint swelling      Assessment/Plan: 1. Functional deficits which require 3+ hours per day of interdisciplinary therapy in a comprehensive inpatient rehab setting. Physiatrist is providing close team supervision and 24 hour management of active medical problems listed below. Physiatrist and rehab team continue to assess barriers to discharge/monitor patient progress toward functional and medical goals  Care  Tool:  Bathing    Body parts bathed by patient: Right arm, Left arm, Chest, Abdomen, Front perineal area, Buttocks, Right upper leg, Face, Left lower leg, Left upper leg, Right lower leg         Bathing assist Assist Level: Set up assist     Upper Body Dressing/Undressing Upper body dressing   What is the patient wearing?: Pull over shirt, Bra    Upper body assist Assist Level: Independent    Lower Body Dressing/Undressing Lower body dressing      What is the patient wearing?: Pants, Underwear/pull up     Lower body assist Assist for lower body dressing: Independent     Toileting Toileting    Toileting assist Assist for toileting: Supervision/Verbal cueing     Transfers Chair/bed transfer  Transfers assist     Chair/bed transfer assist level: Supervision/Verbal cueing     Locomotion Ambulation   Ambulation assist      Assist level: Supervision/Verbal cueing Assistive device: No Device Max distance: 1,089ft   Walk 10 feet activity   Assist     Assist level: Supervision/Verbal cueing Assistive device: No Device   Walk 50 feet activity   Assist Walk 50 feet with 2 turns activity did not occur: Safety/medical concerns (Per PT eval ambulated only 30 feet)  Assist level: Supervision/Verbal cueing Assistive  device: No Device    Walk 150 feet activity   Assist Walk 150 feet activity did not occur: Safety/medical concerns  Assist level: Supervision/Verbal cueing Assistive device: No Device    Walk 10 feet on uneven surface  activity   Assist Walk 10 feet on uneven surfaces activity did not occur: Safety/medical concerns   Assist level: Supervision/Verbal cueing     Wheelchair     Assist Will patient use wheelchair at discharge?: No   Wheelchair activity did not occur: N/A         Wheelchair 50 feet with 2 turns activity    Assist    Wheelchair 50 feet with 2 turns activity did not occur: N/A       Wheelchair  150 feet activity     Assist  Wheelchair 150 feet activity did not occur: N/A       Blood pressure 122/77, pulse 90, temperature 97.9 F (36.6 C), resp. rate 17, height 5\' 4"  (1.626 m), last menstrual period 01/02/2003, SpO2 98 %.  Medical Problem List and Plan: 1.  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  Antithrombotics: -DVT/anticoagulation:  Pharmaceutical: Lovenox- may discontinue , walking >100'             -antiplatelet therapy: DAPT X 3 weeks followed by ASA alone.  3. Pain Management: N/A 4. Mood: LCSW to follow for evaluation and support.              -antipsychotic agents: N/a 5. Neuropsych: This patient may not be fully capable of making decisions on her own behalf. 6. Skin/Wound Care: Routine pressure relief measures. 7. Fluids/Electrolytes/Nutrition: Monitor I/O. Check lytes in am. 8. HTN: Monitor BP tid--continue Cozaar. Vitals:   07/07/20 1934 07/08/20 0427  BP: (!) 153/99 122/77  Pulse: 88 90  Resp: 17 17  Temp: 98.5 F (36.9 C) 97.9 F (36.6 C)  SpO2: 100% 98%  Continue Cozaar 9. H/o thrombocytopenia: Recheck CBC in am as now on DAPT- plt nl 254K 11. Dyslipidemia: Conitnue Lipitor.    >30 minutes spent in discharge of patient including review of medications and follow-up appointments, physical examination, and in answering all patient's questions     LOS: 10 days A FACE TO [place] 07/08/2020, 10:39 AM

## 2020-07-11 DIAGNOSIS — R7989 Other specified abnormal findings of blood chemistry: Secondary | ICD-10-CM

## 2020-07-11 DIAGNOSIS — D649 Anemia, unspecified: Secondary | ICD-10-CM

## 2020-07-12 LAB — CUP PACEART REMOTE DEVICE CHECK
Date Time Interrogation Session: 20220711190438
Implantable Pulse Generator Implant Date: 20211020

## 2020-07-13 ENCOUNTER — Encounter: Payer: Self-pay | Admitting: Occupational Therapy

## 2020-07-13 ENCOUNTER — Ambulatory Visit: Payer: Medicare HMO | Attending: Physician Assistant | Admitting: Occupational Therapy

## 2020-07-13 ENCOUNTER — Other Ambulatory Visit: Payer: Self-pay

## 2020-07-13 ENCOUNTER — Ambulatory Visit: Payer: Medicare HMO | Admitting: Speech Pathology

## 2020-07-13 DIAGNOSIS — R278 Other lack of coordination: Secondary | ICD-10-CM | POA: Insufficient documentation

## 2020-07-13 DIAGNOSIS — R262 Difficulty in walking, not elsewhere classified: Secondary | ICD-10-CM | POA: Insufficient documentation

## 2020-07-13 DIAGNOSIS — R2689 Other abnormalities of gait and mobility: Secondary | ICD-10-CM | POA: Diagnosis not present

## 2020-07-13 DIAGNOSIS — R41844 Frontal lobe and executive function deficit: Secondary | ICD-10-CM | POA: Diagnosis not present

## 2020-07-13 DIAGNOSIS — R41841 Cognitive communication deficit: Secondary | ICD-10-CM | POA: Insufficient documentation

## 2020-07-13 DIAGNOSIS — R41842 Visuospatial deficit: Secondary | ICD-10-CM | POA: Insufficient documentation

## 2020-07-13 DIAGNOSIS — M6281 Muscle weakness (generalized): Secondary | ICD-10-CM | POA: Diagnosis not present

## 2020-07-14 ENCOUNTER — Encounter: Payer: Self-pay | Admitting: Emergency Medicine

## 2020-07-14 ENCOUNTER — Encounter: Payer: Self-pay | Admitting: Speech Pathology

## 2020-07-14 ENCOUNTER — Ambulatory Visit (INDEPENDENT_AMBULATORY_CARE_PROVIDER_SITE_OTHER): Payer: Medicare HMO | Admitting: Emergency Medicine

## 2020-07-14 VITALS — BP 132/84 | HR 87 | Temp 98.0°F | Ht 64.0 in | Wt 114.0 lb

## 2020-07-14 DIAGNOSIS — I10 Essential (primary) hypertension: Secondary | ICD-10-CM | POA: Diagnosis not present

## 2020-07-14 DIAGNOSIS — Z8673 Personal history of transient ischemic attack (TIA), and cerebral infarction without residual deficits: Secondary | ICD-10-CM

## 2020-07-14 DIAGNOSIS — Z09 Encounter for follow-up examination after completed treatment for conditions other than malignant neoplasm: Secondary | ICD-10-CM

## 2020-07-14 NOTE — Therapy (Signed)
[address] [place], [name], [address] Phone: [phone]   Fax:  [phone]  Occupational Therapy Evaluation  Patient Details  Name: LAMOYNE PALENCIA MRN: [id number] Date of Birth: 02-19-1955 Referring Provider (OT): Lauraine Rinne, PA-C   Encounter Date: 07/13/2020   OT End of Session - 07/13/20 1053     Visit Number 1    Number of Visits 9    Date for OT Re-Evaluation 10/05/20    Authorization Type [employer]    Authorization Time Period TBD    OT Start Time 1015    OT Stop Time 1055    OT Time Calculation (min) 40 min    Activity Tolerance Patient tolerated treatment well    Behavior During Therapy [facility] for tasks assessed/performed             Past Medical History:  Diagnosis Date   Anemia    Anxiety    Depression    Dysmenorrhea    Endometriosis    Fibroid    Hypertension    Microhematuria    negative workup   Osteoporosis    Tachycardia    Thrombocytopenia ([place])     Past Surgical History:  Procedure Laterality Date   BREAST BIOPSY     CESAREAN SECTION     hysteroscopic resection     implantable loop recorder implant  10/21/2019   Medtronic Reveal Linq model LNQ 22 (SN [id number]) implantable loop recorder    There were no vitals filed for this visit.   Subjective Assessment - 07/13/20 1050     Subjective  Pt arrives to session today w/ primary concerns related to L-sided weakness and decreased coordination 2/2 CVA that occurred 06/24/20. Pt reports she presented to the ED due to her symptoms after a fall while exercising at the gym. Pt also states she feels one of her medications is causing increased fatigue; "I'm extremely tired."    Pertinent History "Bilateral scattered infarcts" (per Dr. Erlinda Hong progress note on 06/25/20); PMHx includes history of CVA w/ residual RUE deficits, vascular dementia w/ behavior disturbance, HTN, thrombocytopenia, and ILR    Patient Stated Goals Get  strength back in (R) hand; "write better"    Currently in Pain? No/denies              [facility] OT Assessment - 07/13/20 1017       Assessment   Medical Diagnosis cerebral infarction    Referring Provider (OT) Lauraine Rinne, PA-C    Onset Date/Surgical Date 06/24/20    Hand Dominance Right    Next MD Visit 07/14/20    Prior Therapy Yes      Precautions   Precautions Fall      Balance Screen   Has the patient fallen in the past 6 months Yes    How many times? 1      Home  Environment   Type of [place] entrance    Home Layout One level    [place] bars - tub/shower;[place] bars - toilet    Lives With Spouse   Husband Eduard Clos)     Prior Function   Vocation Retired    Dietitian    Leisure going on walks, exercise      ADL   ADL comments Reports no difficulties w/ BADLs      IADL   Light Housekeeping Performs light daily  tasks such as dishwashing, bed making    Meal Prep Needs to have meals prepared and served    Prior Level of Function Community Mobility --   "One of my goals is to be able to drive"   Education officer, environmental on family or friends for transportation    Medication Management Is responsible for taking medication in correct dosages at correct time   Per pt report     Mobility   Mobility Status Comments Mild unsteady gait w/ ambulation into session      Written Expression   Handwriting Increased time;Mild micrographia      Vision - History   Baseline Vision Wears glasses all the time    Additional Comments Switches glasses; feels like her previous pair were not strong enough and reports needing to follow up w/ optometry      Vision Assessment   Ocular Range of Motion Within Functional Limits    Tracking/Visual Pursuits Requires cues, head turns, or add eye shifts to track    Saccades Decreased speed of saccadic movement    Convergence Impaired - to be  further tested in functional context    Visual Fields No apparent deficits      Cognition   Overall Cognitive Status Cognition to be further assessed in functional context PRN      Observation/Other Assessments   Outcome Measures Trail Making Test: Part A (1 min, 58 sec)   1 error     Sensation   Light Touch Appears Intact    Hot/Cold Appears Intact   per pt report     Coordination   Gross Motor Movements are Fluid and Coordinated Yes    Fine Motor Movements are Fluid and Coordinated No    Finger Nose Finger Test [employer]    9 Hole Peg Test Right;Left    Right 9 Hole Peg Test 50 sec    Left 9 Hole Peg Test 39 sec    Box and Blocks L hand (30); R hand (24)      Tone   Assessment Location Right Upper Extremity;Left Upper Extremity      ROM / Strength   AROM / PROM / Strength AROM;Strength      Hand Function   Right Hand Gross Grasp Functional   Slightly decreased grip compared w/ L hand; to be further assessed in functional context   Left Hand Gross Grasp Functional      RUE Tone   RUE Tone Modified Ashworth      RUE Tone   Modified Ashworth Scale for Grading Hypertonia RUE Slight increase in muscle tone, manifested by a catch and release or by minimal resistance at the end of the range of motion when the affected part(s) is moved in flexion or extension      LUE Tone   LUE Tone Modified [place]worth      LUE Tone   Modified Ashworth Scale for Grading Hypertonia LUE Slight increase in muscle tone, manifested by a catch and release or by minimal resistance at the end of the range of motion when the affected part(s) is moved in flexion or extension              OT Education - 07/13/20 1052     Education Details Education provided on role and purpose of OT, as well as potential interventions and goals for therapy.    Person(s) Educated Patient    Methods Explanation    Comprehension Verbalized understanding  OT Short Term Goals - 07/13/20 1314       OT  SHORT TERM GOAL #1   Title Pt will improve bilateral FMC for functional tasks as evidenced by decreasing 9-HPT time by at least 4 sec w/ each hand    Baseline R hand 50 sec; L hand 39 sec    Time 4    Period Weeks    Status New    Target Date 08/12/20      OT SHORT TERM GOAL #2   Title Pt w/ demo independence w/ compensatory strategies or AE prn to improve participation in handwriting    Baseline Reports difficulty w/ handwriting; decreased speed and legibility    Time 4    Period Weeks    Status New             OT Long Term Goals - 07/13/20 1330       OT LONG TERM GOAL #1   Title Pt will be independent w/ full HEP designed for BUE strengthening, GM/FMC, and coordination by discharge    Baseline No HEP at this time    Time 8    Period Weeks    Status New    Target Date 09/09/20      OT LONG TERM GOAL #2   Title Pt will improve GM control and coordination for improved participation in ADLs as evidenced by increasing Box and Blocks score by at least 5 blocks w/ both UEs    Baseline R hand 24 blocks; L hand 30 blocks    Time 8    Period Weeks    Status New      OT LONG TERM GOAL #3   Title Pt will demonstrate improved visual perceptual skills as evidenced by decreasing Trail Making Test: Part A time by at least 10 seconds    Baseline TMT: Part A 1 min, 58 sec w/ 1 error    Time 8    Period Weeks    Status New              Plan - 07/13/20 1054     Clinical Impression Statement Pt is a 65 y.o. female who presents to OP OT due to CVA w/ residual LUE/LLE weakness that occurred 06/24/20. Pt currently lives with her husband in a single-level home and is not working at this time. PMHx includes history of CVA w/ residual RUE deficits, vascular dementia w/ behavior disturbance, HTN, thrombocytopenia, and ILR. Pt will benefit from skilled occupational therapy services to address strength and coordination, ROM, GM/FM control, safety awareness, compensatory strategies prn,  visual-perception/visuospatial deficits, and implementation of an HEP to improve safety during ADLs and IADLs and decrease caregiver strain.    OT Occupational Profile and History Detailed Assessment- Review of Records and additional review of physical, cognitive, psychosocial history related to current functional performance    Occupational performance deficits (Please refer to evaluation for details): ADL's;IADL's;Leisure;Social Participation    Body Structure / Function / Physical Skills ADL;Decreased knowledge of use of DME;Strength;Balance;Dexterity;GMC;Tone;UE functional use;Body mechanics;Endurance;IADL;ROM;Vision;Coordination;Mobility;FMC    Cognitive Skills Memory;Perception;Problem Solve;Safety Awareness;Sequencing    Psychosocial Skills Environmental  Adaptations    Rehab Potential Fair    Clinical Decision Making Several treatment options, min-mod task modification necessary    Comorbidities Affecting Occupational Performance: Presence of comorbidities impacting occupational performance    Comorbidities impacting occupational performance description: Prior CVA affecting RUE; vascular dementia with behavior disturbance    Modification or Assistance to Complete Evaluation  No modification of  tasks or assist necessary to complete eval    OT Frequency 1x / week    OT Duration 8 weeks    OT Treatment/Interventions Self-care/ADL training;DME and/or AE instruction;Balance training;Aquatic Therapy;Therapeutic activities;Therapeutic exercise;Cognitive remediation/compensation;Neuromuscular education;Functional Mobility Training;Visual/perceptual remediation/compensation;Energy conservation;Manual Therapy;Patient/family education    Plan Review goals w/ pt; introduce coordination activities for BUEs    Consulted and Agree with Plan of Care Patient             Patient will benefit from skilled therapeutic intervention in order to improve the following deficits and impairments:   Body  Structure / Function / Physical Skills: ADL, Decreased knowledge of use of DME, Strength, Balance, Dexterity, GMC, Tone, UE functional use, Body mechanics, Endurance, IADL, ROM, Vision, Coordination, Mobility, [facility] Cognitive Skills: Memory, Perception, Problem Solve, Safety Awareness, Sequencing Psychosocial Skills: Environmental  Adaptations   Visit Diagnosis: Other lack of coordination  Muscle weakness (generalized)  Visuospatial deficit  Frontal lobe and executive function deficit    Problem List Patient Active Problem List   Diagnosis Date Noted   Azotemia 07/11/2020   Anemia 07/11/2020   CVA (cerebral vascular accident) ([place]) 06/28/2020   Cerebral infarction ([place]) 06/25/2020   Acute left-sided weakness 06/24/2020   Vascular dementia with behavior disturbance ([place]) 10/13/2019   Vascular dementia without behavioral disturbance ([place]) 07/15/2019   Cerebrovascular accident ([place]) 06/25/2019   Brain fog 04/22/2019   History of COVID-19 04/22/2019   Essential hypertension 03/03/2019   Hypertensive urgency 03/03/2019     Kathrine Cords, OTR/L, MSOT  07/14/2020, 1:39 PM  [address] [place], [name], [address] Phone: [phone]   Fax:  [phone]  Name: POPPI SCANTLING MRN: [id number] Date of Birth: 1955/05/19

## 2020-07-14 NOTE — Progress Notes (Signed)
Jacqueline Bonilla 65 y.o.   Chief Complaint  Patient presents with   Hospitalization Follow-up    stroke    HISTORY OF PRESENT ILLNESS: This is a 65 y.o. female here for follow-up of recent hospital stay due to cryptogenic stroke. Discharge summary as follows: Physician Discharge Summary  Patient ID: Jacqueline Bonilla MRN: [id number] DOB/AGE: 1955-11-08 65 y.o.   Admit date: 06/28/2020 Discharge date: Years 07/08/2020   Discharge Diagnoses:  Principal Problem:   CVA (cerebral vascular accident) [facility]) Active Problems:   Essential hypertension   Vascular dementia without behavioral disturbance ([place])   Acute left-sided weakness   Azotemia   Anemia  Brief HPI:   Jacqueline Bonilla is a 65 y.o. 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  She had waxing and waning per EMS but refused tPA.  MRI/MRA brain done revealing punctate infarct and high "report except faint areas of DWI signal in bilateral precentral gyri question acute/subacute infarct or artifact.  Stroke felt to be embolic recorder was interrogated with negative for A. fib.  Dr. Erlinda Hong recommended aspirin/Plavix x3 weeks followed by Plavix alone.  Patient continued to be limited by left-sided weakness with apraxia as well as premorbid mild cognitive deficits with moderate word finding issues.  CIR was recommended due to functional decline.     Hospital Course: Jacqueline Bonilla was admitted to rehab 06/28/2020 for inpatient therapies to consist of PT, ST and OT at least three hours five days a week. Past admission physiatrist, therapy team and rehab RN have worked together to provide customized collaborative inpatient rehab. .  Blood pressures were monitored on 3 times daily basis and were noted to be trending  upwards therefore Cozaar was resumed.  Follow-up CBC showed platelets to be within normal limits with mild drop in H&H noted without signs of healing.  Check of electrolytes showed evidence of prerenal azotemia and she was encouraged to increase fluid intake.  Her mood has been stable without unsafe behaviors and she has made steady progress during her stay.She continues to have decreased coordination in lower extremity as well as premorbid issues with RUE.  Supervision is recommended for safety.  She will continue to receive follow-up outpatient therapy after discharge     Rehab course: During patient's stay in rehab weekly team conferences were held to monitor patient's progress, set goals and discuss barriers to discharge. At admission, patient required min assist with mobility and ADL tasks.  She was found to have mild cognitive deficits with slums score 21 out of 30 and reported occasional word finding difficulty. She  has had improvement in activity tolerance, balance, postural control as well as ability to compensate for deficits.  She is able to complete ADL tasks with supervision.  She requires supervision for transfers and is able to ambulate up to 8000 feet without an assistive device. She requires supervision for basic cognitive tasks and min assist with high-level problem-solving. She is able to complete tasks with 80% accuracy and supervision with cues to identify errors.   Discharge disposition: 01-Home or Self Care   Diet: Heart healthy   Special Instructions: No strenuous activity or gym workouts till cleared by PT. Recommend repeat CBC and be met in 1 to 2 weeks to monitor renal status as well as H&H.  Stop aspirin after 8 days.   Discharge Instructions       Ambulatory referral to Physical Medicine Rehab   Complete by: As directed      TC appt in 1-2 weeks.   Today doing well.  Slowly recovering.  More physically active.  Still struggling with balance issues. Taking Plavix and  aspirin. Hypertension: Taking losartan 25 mg daily. Dyslipidemia: Taking atorvastatin 40 mg daily. No complications, no falls, in good spirits.   HPI   Prior to Admission medications   Medication Sig Start Date End Date Taking? Authorizing Provider  Ascorbic Acid (VITAMIN C PO) Take 1 tablet by mouth daily.   Yes [provider]  aspirin EC 81 MG tablet Take 1 tablet (81 mg total) by mouth daily. Swallow whole. 06/28/20  Yes Kathie Dike, MD  atorvastatin (LIPITOR) 40 MG tablet Take 1 tablet (40 mg total) by mouth daily. 07/07/20  Yes Love, Ivan Anchors, PA-C  Cholecalciferol (VITAMIN D3 GUMMIES PO) Take 1,000 mcg by mouth daily.   Yes [provider]  clopidogrel (PLAVIX) 75 MG tablet Take 1 tablet (75 mg total) by mouth daily. 07/07/20  Yes Love, Ivan Anchors, PA-C  Multiple Vitamins-Minerals (ZINC PO) Take 1 tablet by mouth daily.   Yes [provider]  Thiamine HCl (VITAMIN B-1 PO) Take 1 tablet by mouth daily.   Yes [provider]  losartan (COZAAR) 25 MG tablet Take 1 tablet (25 mg total) by mouth daily. Patient taking differently: Take 25 mg by mouth daily in the afternoon. 07/20/19 06/24/20  Horald Pollen, MD    Allergies  Allergen Reactions   Avelox [Moxifloxacin]    Ciprofloxacin Hives   Floraquin [Iodoquinol]     No cipro or others   Iohexol      Code: HIVES, Desc: HIVES S/P IVP MANY YRS AGO...NO PROBLEM W/O PREP TODAY/A.CALHOUN, Onset Date: [id number]    Levaquin [Levofloxacin]    Quinolones    Shellfish Allergy Itching    Itching under the chin. Described by patient but not seen by husband.    Patient Active Problem List   Diagnosis Date Noted   Azotemia 07/11/2020   Anemia 07/11/2020   CVA (cerebral vascular accident) ([place]) 06/28/2020   Cerebral infarction ([place]) 06/25/2020   Acute left-sided weakness 06/24/2020   Vascular dementia with behavior disturbance ([place]) 10/13/2019   Vascular dementia without behavioral disturbance  ([place]) 07/15/2019   Cerebrovascular accident ([place]) 06/25/2019   Brain fog 04/22/2019   History of COVID-19 04/22/2019   Essential hypertension 03/03/2019   Hypertensive urgency 03/03/2019    Past Medical History:  Diagnosis Date   Anemia    Anxiety    Depression    Dysmenorrhea    Endometriosis    Fibroid    Hypertension    Microhematuria    negative workup   Osteoporosis    Tachycardia    Thrombocytopenia (HCC)     Past Surgical History:  Procedure Laterality Date   BREAST BIOPSY     CESAREAN SECTION     hysteroscopic resection     implantable loop recorder implant  10/21/2019   Medtronic Reveal Linq model LNQ 22 ([place] [id number]) implantable loop recorder    Social History   Socioeconomic History   Marital status: Married    Spouse name: Not on file   Number of children: 1   Years of education: Not on file   Highest education level: Not on file  Occupational History   Occupation: accounting  Tobacco Use  Smoking status: Never   Smokeless tobacco: Never  Vaping Use   Vaping Use: Never used  Substance and Sexual Activity   Alcohol use: Never   Drug use: Never   Sexual activity: Not Currently    Partners: Male    Comment: husband vasectomy  Other Topics Concern   Not on file  Social History Narrative   Lives with husband   Grandchildren - 1   Works - Investment banker, 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  Negative for chills and fever.  HENT:  Negative for congestion and sore throat.   Respiratory: Negative.  Negative for cough and shortness of breath.   Cardiovascular: Negative.  Negative for chest pain and palpitations.  Gastrointestinal:  Negative for abdominal pain, diarrhea, nausea and vomiting.  Genitourinary: Negative.  Negative for dysuria and hematuria.  Skin: Negative.  Negative for rash.  Neurological:  Negative for dizziness and headaches.  All other systems reviewed and are negative.  Today's Vitals   07/14/20 1352  BP: 132/84  Pulse: 87  Temp: 98 F (36.7 C)  TempSrc: Oral  SpO2: 96%  Weight: 114 lb (51.7 kg)  Height: 5' 4"  (1.626 m)   Body mass index is 19.57 kg/m. Wt Readings from Last 3 Encounters:  07/14/20 114 lb (51.7 kg)  06/24/20 115 lb 8.3 oz (52.4 kg)  04/12/20 115 lb (52.2 kg)    Physical Exam Vitals reviewed.  Constitutional:      Appearance: Normal appearance.  HENT:     Head: Normocephalic.  Eyes:     Extraocular Movements: Extraocular movements intact.     Conjunctiva/sclera: Conjunctivae normal.     Pupils: Pupils are equal, round, and reactive to light.  Cardiovascular:     Rate and Rhythm: Normal rate and regular rhythm.     Pulses: Normal pulses.     Heart sounds: Normal heart sounds.  Pulmonary:     Effort: Pulmonary effort is normal.     Breath sounds: Normal breath sounds.  Abdominal:     Palpations: Abdomen is soft.     Tenderness: There is no abdominal tenderness.  Musculoskeletal:     Cervical back: Normal range of motion and neck supple.     Right lower leg: No edema.     Left lower leg: No edema.  Skin:    General: Skin is warm and dry.  Neurological:     General: No focal deficit present.     Mental Status: She is alert and oriented to person, place, and time.     Sensory: No sensory deficit.     Motor: No weakness.     Coordination: Coordination normal.     Comments: Ambulates well but  not able to walk  straight line foot in front of another  Psychiatric:        Mood and Affect: Mood normal.        Behavior: Behavior normal.     ASSESSMENT & PLAN: A total of 40 minutes was spent with the patient and counseling/coordination of care regarding preparing for this visit, review of hospitalist discharge summary, review of most recent office visit notes, review of most recent blood work results, review of all medications, secondary prevention of stroke, long-term anticoagulation and fall precautions, education and nutrition, physical activity instructions, prognosis, documentation and need for follow-up in 6 weeks after neurology follow-up appointment.  Essential hypertension Well-controlled.  Continue losartan 25 mg daily. Dietary approaches to stop hypertension discussed.  CVA (cerebral vascular accident) [facility]) Recovering well.  No complications.  No new symptoms or deficits. On Plavix and aspirin. Fall precautions discussed. Secondary prevention measures discussed.  Treacy was seen today for hospitalization follow-up.  Diagnoses and all orders for this visit:  Hospital discharge follow-up  History of stroke  Essential hypertension  Patient Instructions  Stroke Prevention Some medical conditions and lifestyle choices can lead to a higher risk for a stroke. You can help to prevent a stroke by eating healthy foods and exercising. It also helps to not smoke and to manage any health problems youmay have. How can this condition affect me? A stroke is an emergency. It should be treated right away. A stroke can lead to brain damage or threaten your life. There is a better chance of surviving andgetting better after a stroke if you get medical help right away. What can increase my risk? The following medical conditions may increase your risk of a stroke: Diseases of the heart and blood vessels (cardiovascular disease). High blood pressure (hypertension). Diabetes. High  cholesterol. Sickle cell disease. Problems with blood clotting. Being very overweight. Sleeping problems (obstructivesleep apnea). Other risk factors include: Being older than age 71. A history of blood clots, stroke, or mini-stroke (TIA). Race, ethnic background, or a family history of stroke. Smoking or using tobacco products. Taking birth control pills, especially if you smoke. Heavy alcohol and drug use. Not being active. What actions can I take to prevent this? Manage your health conditions High cholesterol. Eat a healthy diet. If this is not enough to manage your cholesterol, you may need to take medicines. Take medicines as told by your doctor. High blood pressure. Try to keep your blood pressure below 130/80. If your blood pressure cannot be managed through a healthy diet and regular exercise, you may need to take medicines. Take medicines as told by your doctor. Ask your doctor if you should check your blood pressure at home. Have your blood pressure checked every year. Diabetes. Eat a healthy diet and get regular exercise. If your blood sugar (glucose) cannot be managed through diet and exercise, you may need to take medicines. Take medicines as told by your doctor. Talk to your doctor about getting checked for sleeping problems. Signs of a problem can include: Snoring a lot. Feeling very tired. Make sure that you manage any other conditions you have. Nutrition  Follow instructions from your doctor about what to eat or drink. You may be told to: Eat and drink fewer calories each day. Limit how much salt (sodium) you use to 1,500 milligrams (mg) each day. Use only healthy fats for cooking, such as olive oil, canola oil, and sunflower oil. Eat healthy foods. To do this: Choose foods that are high in fiber. These include  whole grains, and fresh fruits and vegetables. Eat at least 5 servings of fruits and vegetables a day. Try to fill one-half of your plate with fruits  and vegetables at each meal. Choose low-fat (lean) proteins. These include low-fat cuts of meat, chicken without skin, fish, tofu, beans, and nuts. Eat low-fat dairy products. Avoid foods that: Are high in salt. Have saturated fat. Have trans fat. Have cholesterol. Are processed or pre-made. Count how many carbohydrates you eat and drink each day.  Lifestyle If you drink alcohol: Limit how much you have to: 0-1 drink a day for women who are not pregnant. 0-2 drinks a day for men. Know how much alcohol is in your drink. In the [place] one drink equals one 12 oz bottle of beer (316m), one 5 oz glass of wine (1414m, or one 1 oz glass of hard liquor (4429m Do not smoke or use any products that have nicotine or tobacco. If you need help quitting, ask your doctor. Avoid secondhand smoke. Do not use drugs. Activity  Try to stay at a healthy weight. Get at least 30 minutes of exercise on most days, such as: Fast walking. Biking. Swimming.  Medicines Take over-the-counter and prescription medicines only as told by your doctor. Avoid taking birth control pills. Talk to your doctor about the risks of taking birth control pills if: You are over 35 39ars old. You smoke. You get very bad headaches. You have had a blood clot. Where to find more information American Stroke Association: www.strokeassociation.org Get help right away if: You or a loved one has any signs of a stroke. "BE FAST" is an easy way to remember the warning signs: B - Balance. Dizziness, sudden trouble walking, or loss of balance. E - Eyes. Trouble seeing or a change in how you see. F - Face. Sudden weakness or loss of feeling of the face. The face or eyelid may droop on one side. A - Arms. Weakness or loss of feeling in an arm. This happens all of a sudden and most often on one side of the body. S - Speech. Sudden trouble speaking, slurred speech, or trouble understanding what people say. T - Time. Time to call  emergency services. Write down what time symptoms started. You or a loved one has other signs of a stroke, such as: A sudden, very bad headache with no known cause. Feeling like you may vomit (nausea). Vomiting. A seizure. These symptoms may be an emergency. Get help right away. Call your local emergency services (911 in the [place] Do not wait to see if the symptoms will go away. Do not drive yourself to the hospital. Summary You can help to prevent a stroke by eating healthy, exercising, and not smoking. It also helps to manage any health problems you have. Do not smoke or use any products that contain nicotine or tobacco. Get help right away if you or a loved one has any signs of a stroke. This information is not intended to replace advice given to you by your health care provider. Make sure you discuss any questions you have with your healthcare provider. Document Revised: 07/20/2019 Document Reviewed: 07/20/2019 Elsevier Patient Education  2022 [place] [facility]

## 2020-07-14 NOTE — Assessment & Plan Note (Signed)
Well-controlled.  Continue losartan 25 mg daily. Dietary approaches to stop hypertension discussed.

## 2020-07-14 NOTE — Patient Instructions (Signed)
Stroke Prevention Some medical conditions and lifestyle choices can lead to a higher risk for a stroke. You can help to prevent a stroke by eating healthy foods and exercising. It also helps to not smoke and to manage any health problems youmay have. How can this condition affect me? A stroke is an emergency. It should be treated right away. A stroke can lead to brain damage or threaten your life. There is a better chance of surviving andgetting better after a stroke if you get medical help right away. What can increase my risk? The following medical conditions may increase your risk of a stroke: Diseases of the heart and blood vessels (cardiovascular disease). High blood pressure (hypertension). Diabetes. High cholesterol. Sickle cell disease. Problems with blood clotting. Being very overweight. Sleeping problems (obstructivesleep apnea). Other risk factors include: Being older than age 74. A history of blood clots, stroke, or mini-stroke (TIA). Race, ethnic background, or a family history of stroke. Smoking or using tobacco products. Taking birth control pills, especially if you smoke. Heavy alcohol and drug use. Not being active. What actions can I take to prevent this? Manage your health conditions High cholesterol. Eat a healthy diet. If this is not enough to manage your cholesterol, you may need to take medicines. Take medicines as told by your doctor. High blood pressure. Try to keep your blood pressure below 130/80. If your blood pressure cannot be managed through a healthy diet and regular exercise, you may need to take medicines. Take medicines as told by your doctor. Ask your doctor if you should check your blood pressure at home. Have your blood pressure checked every year. Diabetes. Eat a healthy diet and get regular exercise. If your blood sugar (glucose) cannot be managed through diet and exercise, you may need to take medicines. Take medicines as told by your  doctor. Talk to your doctor about getting checked for sleeping problems. Signs of a problem can include: Snoring a lot. Feeling very tired. Make sure that you manage any other conditions you have. Nutrition  Follow instructions from your doctor about what to eat or drink. You may be told to: Eat and drink fewer calories each day. Limit how much salt (sodium) you use to 1,500 milligrams (mg) each day. Use only healthy fats for cooking, such as olive oil, canola oil, and sunflower oil. Eat healthy foods. To do this: Choose foods that are high in fiber. These include whole grains, and fresh fruits and vegetables. Eat at least 5 servings of fruits and vegetables a day. Try to fill one-half of your plate with fruits and vegetables at each meal. Choose low-fat (lean) proteins. These include low-fat cuts of meat, chicken without skin, fish, tofu, beans, and nuts. Eat low-fat dairy products. Avoid foods that: Are high in salt. Have saturated fat. Have trans fat. Have cholesterol. Are processed or pre-made. Count how many carbohydrates you eat and drink each day.  Lifestyle If you drink alcohol: Limit how much you have to: 0-1 drink a day for women who are not pregnant. 0-2 drinks a day for men. Know how much alcohol is in your drink. In the [place] one drink equals one 12 oz bottle of beer (36mL), one 5 oz glass of wine (148mL), or one 1 oz glass of hard liquor (79mL). Do not smoke or use any products that have nicotine or tobacco. If you need help quitting, ask your doctor. Avoid secondhand smoke. Do not use drugs. Activity  Try to stay at a healthy  weight. Get at least 30 minutes of exercise on most days, such as: Fast walking. Biking. Swimming.  Medicines Take over-the-counter and prescription medicines only as told by your doctor. Avoid taking birth control pills. Talk to your doctor about the risks of taking birth control pills if: You are over 73 years old. You smoke. You  get very bad headaches. You have had a blood clot. Where to find more information American Stroke Association: www.strokeassociation.org Get help right away if: You or a loved one has any signs of a stroke. "BE FAST" is an easy way to remember the warning signs: B - Balance. Dizziness, sudden trouble walking, or loss of balance. E - Eyes. Trouble seeing or a change in how you see. F - Face. Sudden weakness or loss of feeling of the face. The face or eyelid may droop on one side. A - Arms. Weakness or loss of feeling in an arm. This happens all of a sudden and most often on one side of the body. S - Speech. Sudden trouble speaking, slurred speech, or trouble understanding what people say. T - Time. Time to call emergency services. Write down what time symptoms started. You or a loved one has other signs of a stroke, such as: A sudden, very bad headache with no known cause. Feeling like you may vomit (nausea). Vomiting. A seizure. These symptoms may be an emergency. Get help right away. Call your local emergency services (911 in the [place] Do not wait to see if the symptoms will go away. Do not drive yourself to the hospital. Summary You can help to prevent a stroke by eating healthy, exercising, and not smoking. It also helps to manage any health problems you have. Do not smoke or use any products that contain nicotine or tobacco. Get help right away if you or a loved one has any signs of a stroke. This information is not intended to replace advice given to you by your health care provider. Make sure you discuss any questions you have with your healthcare provider. Document Revised: 07/20/2019 Document Reviewed: 07/20/2019 Elsevier Patient Education  [place].

## 2020-07-14 NOTE — Therapy (Signed)
[place]. [place], [name], [address] Phone: [phone]   Fax:  [phone]  Speech Language Pathology Evaluation  Patient Details  Name: Jacqueline Bonilla MRN: [id number] Date of Birth: 1955-01-15 Referring Provider (SLP): Cathlyn Parsons, PA-C   Encounter Date: 07/13/2020   End of Session - 07/14/20 1336     Visit Number 1    Number of Visits 1    SLP Start Time 1100    SLP Stop Time  1145    SLP Time Calculation (min) 45 min    Activity Tolerance Patient tolerated treatment well             Past Medical History:  Diagnosis Date   Anemia    Anxiety    Depression    Dysmenorrhea    Endometriosis    Fibroid    Hypertension    Microhematuria    negative workup   Osteoporosis    Tachycardia    Thrombocytopenia [facility])     Past Surgical History:  Procedure Laterality Date   BREAST BIOPSY     CESAREAN SECTION     hysteroscopic resection     implantable loop recorder implant  10/21/2019   Medtronic Reveal Linq model LNQ 22 ([place] [id number]) implantable loop recorder    There were no vitals filed for this visit.   Subjective Assessment - 07/14/20 1329     Subjective Pt reports she has been doing well and is back for evaluation post most recent CVA.    Currently in Pain? No/denies                SLP Evaluation [facility] - 07/14/20 1329       SLP Visit Information   SLP Received On 07/13/20    Referring Provider (SLP) Cathlyn Parsons, PA-C    Onset Date 06/24/20    Medical Diagnosis CVA      Subjective   Patient/Family Stated Goal To drive      General Information   HPI Jacqueline Bonilla is a 65 y.o. female who presented for left sided weakness and facial droop. MRI 6/24: "1. Punctate acute infarct in the high left frontal cortex. Additional faint punctate areas of DWI signal in bilateral precentral gyri are suspicious for additional acute or subacute infarcts, but could  potentially represent artifact. Given potential involvement of multiple vascular territories, consider a central embolic etiology.  2. 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  3. Moderate atrophy."  Pt has medical history significant of past CVA, vascular dementia, HTN, thrombocytopenia and ILR.      Balance Screen   Has the patient fallen in the past 6 months Yes    How many times? 1    Has the patient had a decrease in activity level because of a fear of falling?  No    Is the patient reluctant to leave their home because of a fear of falling?  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   Overall Cognitive Status Impaired/Different from [place];Safety/judgement;Awareness    Executive Function Reasoning;Organizing;Self  Monitoring;Sequencing      Auditory Comprehension   Overall Auditory Comprehension Appears within functional limits for tasks assessed      Verbal Expression   Overall Verbal Expression Appears within functional limits for tasks assessed      Standardized Assessments   Standardized Assessments  Other Assessment    Other Assessment CADL-3                               SLP Short Term Goals - 03/21/20 1402       SLP SHORT TERM GOAL #1   Title Pt will provide 3 areas of challenge for her given min verbal cues.    Time 2    Period Weeks    Status Achieved      SLP SHORT TERM GOAL #2   Title Pt will provide 3 strategies to compensate for difficulty recalling information.    Time 2    Period Weeks    Status Achieved      SLP SHORT TERM GOAL #3   Title Pt and caregiver to report use of  memory/attention strategies at home in daily activities to promote increased metacognition.    Time 1    Period Months    Status New              SLP Long Term Goals - 03/21/20 1405       SLP LONG TERM GOAL #1   Title Pt will demonstrate self- awareness of deficits to increase self-regulation.    Time 6    Period Weeks    Status Achieved      SLP LONG TERM GOAL #2   Title Patient will demonstrate use of memory strategies to schedule activities, recall weekly events and items to maintain safety to participate socially in functional living environment    Time 6    Period Weeks    Status On-going              Plan - 07/14/20 1337     Clinical Impression Statement Pt is a 65 yo female with hx of CVA and dx of vascular dementia w/ behavioral disturbance following CVA. Pt reports she is at baseline and has no changes in her "thinking skills". Upon initial observation and conversation, SLP suspects pt is at baseline. Upon last evaluation at this OP facility (03/01/20), pt scord a 17/30. As of 06/29/20, pt's SLUM score was 21/30. SLP assessed pt this date using the CADL-3 to assess functional communication. Pt scored a 93/100 placing her in the 12th percentile. Pt exhibited most difficulty with visuospatial tasks and would benefit from continued assistance and supervision with written/other visual tasks. Pt reports her routine has not changed and her husband is responsible for most iADLs (finances, medication management, cooking). SLP to defer therapy at this time. Pt instructed to return if her and/or [name] had concerns with functional tasks at home and/or if there is further decline in cognitive skills. Pt reported her only goal right now was to continue participating in activities she enjoys and to eventually drive again. Pt was referred to speak with MD about driving; however, based off preliminary screening and extent of visuospatial deficits, SLP would not recommend.    Speech Therapy  Frequency One time visit             Patient will benefit from skilled therapeutic intervention in order to improve the following deficits and impairments:   Cognitive  communication deficit    Problem List Patient Active Problem List   Diagnosis Date Noted   Azotemia 07/11/2020   Anemia 07/11/2020   CVA (cerebral vascular accident) ([place]) 06/28/2020   Cerebral infarction ([place]) 06/25/2020   Acute left-sided weakness 06/24/2020   Vascular dementia with behavior disturbance ([place]) 10/13/2019   Vascular dementia without behavioral disturbance ([place]) 07/15/2019   Cerebrovascular accident ([place]) 06/25/2019   Brain fog 04/22/2019   History of COVID-19 04/22/2019   Essential hypertension 03/03/2019   Hypertensive urgency 03/03/2019    Rosann Auerbach [place] MS, [place], CBIS  07/14/2020, 1:49 PM  [place]. [place], [name], [address] Phone: [phone]   Fax:  [phone]  Name: KYMANI LAURSEN MRN: [id number] Date of Birth: 04/17/1955

## 2020-07-14 NOTE — Assessment & Plan Note (Addendum)
Recovering well.  No complications.  No new symptoms or deficits. On Plavix and aspirin. Fall precautions discussed. Secondary prevention measures discussed.

## 2020-07-15 ENCOUNTER — Encounter: Payer: Self-pay | Admitting: Emergency Medicine

## 2020-07-20 ENCOUNTER — Encounter: Payer: Medicare HMO | Attending: Psychology | Admitting: Registered Nurse

## 2020-07-20 ENCOUNTER — Ambulatory Visit: Payer: Medicare HMO | Admitting: Occupational Therapy

## 2020-07-20 ENCOUNTER — Ambulatory Visit: Payer: Medicare HMO | Admitting: Physical Therapy

## 2020-07-20 ENCOUNTER — Encounter: Payer: Self-pay | Admitting: Occupational Therapy

## 2020-07-20 ENCOUNTER — Other Ambulatory Visit: Payer: Self-pay

## 2020-07-20 ENCOUNTER — Encounter: Payer: Self-pay | Admitting: Physical Therapy

## 2020-07-20 ENCOUNTER — Encounter: Payer: Self-pay | Admitting: Registered Nurse

## 2020-07-20 VITALS — BP 143/96 | HR 76

## 2020-07-20 VITALS — BP 166/99 | HR 82 | Temp 98.4°F | Ht 64.0 in | Wt 117.0 lb

## 2020-07-20 DIAGNOSIS — I10 Essential (primary) hypertension: Secondary | ICD-10-CM | POA: Insufficient documentation

## 2020-07-20 DIAGNOSIS — R41844 Frontal lobe and executive function deficit: Secondary | ICD-10-CM | POA: Diagnosis not present

## 2020-07-20 DIAGNOSIS — R41842 Visuospatial deficit: Secondary | ICD-10-CM

## 2020-07-20 DIAGNOSIS — R41841 Cognitive communication deficit: Secondary | ICD-10-CM | POA: Diagnosis not present

## 2020-07-20 DIAGNOSIS — R262 Difficulty in walking, not elsewhere classified: Secondary | ICD-10-CM

## 2020-07-20 DIAGNOSIS — R278 Other lack of coordination: Secondary | ICD-10-CM

## 2020-07-20 DIAGNOSIS — R2689 Other abnormalities of gait and mobility: Secondary | ICD-10-CM

## 2020-07-20 DIAGNOSIS — M6281 Muscle weakness (generalized): Secondary | ICD-10-CM

## 2020-07-20 DIAGNOSIS — Z8673 Personal history of transient ischemic attack (TIA), and cerebral infarction without residual deficits: Secondary | ICD-10-CM | POA: Diagnosis not present

## 2020-07-20 NOTE — Therapy (Signed)
[place]. [place], [name], [address] Phone: [phone]   Fax:  [phone]  Occupational Therapy Treatment  Patient Details  Name: Jacqueline Bonilla MRN: [id number] Date of Birth: 1955-10-11 Referring Provider (OT): Lauraine Rinne, PA-C   Encounter Date: 07/20/2020   OT End of Session - 07/20/20 0809     Visit Number 2    Number of Visits 9    Date for OT Re-Evaluation 10/05/20    Authorization Type [employer]    Authorization Time Period 1/9 by 09/16/20    OT Start Time 0806    OT Stop Time 0848    OT Time Calculation (min) 42 min    Activity Tolerance Patient tolerated treatment well    Behavior During Therapy [facility] for tasks assessed/performed             Past Medical History:  Diagnosis Date   Anemia    Anxiety    Depression    Dysmenorrhea    Endometriosis    Fibroid    Hypertension    Microhematuria    negative workup   Osteoporosis    Tachycardia    Thrombocytopenia [facility])     Past Surgical History:  Procedure Laterality Date   BREAST BIOPSY     CESAREAN SECTION     hysteroscopic resection     implantable loop recorder implant  10/21/2019   Medtronic Reveal Linq model LNQ 22 ([place] [id number]) implantable loop recorder    There were no vitals filed for this visit.   Subjective Assessment - 07/20/20 0809     Subjective  Pt reports she has an appt w/ [facility] later today    Pertinent History "Bilateral scattered infarcts" (per Dr. Erlinda Hong progress note on 06/25/20); PMHx includes history of CVA w/ residual RUE deficits, vascular dementia w/ behavior disturbance, HTN, thrombocytopenia, and ILR    Patient Stated Goals Get strength back in (R) hand; "write better"    Currently in Pain? No/denies             OT Treatment/Exercises - 07/20/20    Pegboard Activity Copying pattern onto large pegboard; pt able to complete activity w/ 1 v/c for error correction and 1 Ind  self-correction. Activity facilitated visual perception, [name], coordination, and hand strengthening.  Picking up marbles, 2 at a time and translating from palm to fingertips to place on easy-grip pegs. Completed activity w/ 4 drops in 1st set and 1 drop in 2nd set; increased success with decreased speed. To return marbles to container, pt picked up off of pegs individually using 3-point pinch. Activity facilitated eye-hand coordination, [name], in-hand manipulation, and coordination    Playing Cards Rotating card horizontally and end-over-end to facilitate coordination, FMC/dexterity, and in-hand manipulation; completed w/ BUEs and pt demo'ing increased success w/ L hand compared w/ R hand  Pushing cards off deck using thumbs; pt exhibited difficulty w/ motor planning and ultimately completed activity by alternating hands to pinch cards off deck    [place] a ball from hand to hand; completed 2 sets w/ multiple drops            OT Education - 07/20/20 0816     Education Details Reviewed all goals w/ pt and initiated condition-specific CVA education    Person(s) Educated Patient    Methods Explanation    Comprehension Verbalized understanding             OT Short Term  Goals - 07/20/20 0810       OT SHORT TERM GOAL #1   Title Pt will improve bilateral FMC for functional tasks as evidenced by decreasing 9-HPT time by at least 4 sec w/ each hand    Baseline R hand 50 sec; L hand 39 sec    Time 4    Period Weeks    Status On-going    Target Date 08/12/20      OT SHORT TERM GOAL #2   Title Pt w/ demo independence w/ compensatory strategies or AE prn to improve participation in handwriting    Baseline Reports difficulty w/ handwriting; decreased speed and legibility    Time 4    Period Weeks    Status On-going             OT Long Term Goals - 07/20/20 0810       OT LONG TERM GOAL #1   Title Pt will be independent w/ full HEP designed for BUE strengthening,  GM/FMC, and coordination by discharge    Baseline No HEP at this time    Time 8    Period Weeks    Status On-going      OT LONG TERM GOAL #2   Title Pt will improve GM control and coordination for improved participation in ADLs as evidenced by increasing Box and Blocks score by at least 5 blocks w/ both UEs    Baseline R hand 24 blocks; L hand 30 blocks    Time 8    Period Weeks    Status On-going      OT LONG TERM GOAL #3   Title Pt will demonstrate improved visual perceptual skills as evidenced by decreasing Trail Making Test: Part A time by at least 10 seconds    Baseline TMT: Part A 1 min, 58 sec w/ 1 error    Time 8    Period Weeks    Status On-going             Plan - 07/20/20 0809     Clinical Impression Statement OT introduced coordination activities this session w/ pt completing all tasks w/ both UEs due to residual RUE impairment 2/2 prior CVA and LUE impairment 2/2 recent CVA. More pronouned coordination deficits observed w/ RUE compared w/ LUE. Pt completed activities w/ multiple drops and experienced increased success w/ cues for pacing and rest breaks.    OT Occupational Profile and History Detailed Assessment- Review of Records and additional review of physical, cognitive, psychosocial history related to current functional performance    Occupational performance deficits (Please refer to evaluation for details): ADL's;IADL's;Leisure;Social Participation    Body Structure / Function / Physical Skills ADL;Decreased knowledge of use of DME;Strength;Balance;Dexterity;GMC;Tone;UE functional use;Body mechanics;Endurance;IADL;ROM;Vision;Coordination;Mobility;FMC    Cognitive Skills Memory;Perception;Problem Solve;Safety Awareness;Sequencing    Psychosocial Skills Environmental  Adaptations    Rehab Potential Fair    Clinical Decision Making Several treatment options, min-mod task modification necessary    Comorbidities Affecting Occupational Performance: Presence of  comorbidities impacting occupational performance    Comorbidities impacting occupational performance description: Prior CVA affecting RUE; vascular dementia with behavior disturbance    Modification or Assistance to Complete Evaluation  No modification of tasks or assist necessary to complete eval    OT Frequency 1x / week    OT Duration 8 weeks    OT Treatment/Interventions Self-care/ADL training;DME and/or AE instruction;Balance training;Aquatic Therapy;Therapeutic activities;Therapeutic exercise;Cognitive remediation/compensation;Neuromuscular education;Functional Mobility Training;Visual/perceptual remediation/compensation;Energy conservation;Manual Therapy;Patient/family education    Plan Coordination activities  handout; [place] (tossing large ball; walking and catching a scarf; balloon tennis)    Consulted and Agree with Plan of Care Patient             Patient will benefit from skilled therapeutic intervention in order to improve the following deficits and impairments:   Body Structure / Function / Physical Skills: ADL, Decreased knowledge of use of DME, Strength, Balance, Dexterity, GMC, Tone, UE functional use, Body mechanics, Endurance, IADL, ROM, Vision, Coordination, Mobility, [facility] Cognitive Skills: Memory, Perception, Problem Solve, Safety Awareness, Sequencing Psychosocial Skills: Environmental  Adaptations   Visit Diagnosis: Other lack of coordination  Visuospatial deficit  Frontal lobe and executive function deficit  Muscle weakness (generalized)    Problem List Patient Active Problem List   Diagnosis Date Noted   History of stroke 07/14/2020   Azotemia 07/11/2020   Anemia 07/11/2020   CVA (cerebral vascular accident) ([place]) 06/28/2020   Cerebral infarction ([place]) 06/25/2020   Acute left-sided weakness 06/24/2020   Vascular dementia with behavior disturbance ([place]) 10/13/2019   Vascular dementia without behavioral disturbance ([place]) 07/15/2019   Cerebrovascular  accident ([place]) 06/25/2019   Brain fog 04/22/2019   History of COVID-19 04/22/2019   Essential hypertension 03/03/2019   Hypertensive urgency 03/03/2019     Kathrine Cords, OTR/L, MSOT  07/20/2020, 8:19 AM  [place]. [place], [name], [address] Phone: [phone]   Fax:  [phone]  Name: Jacqueline Bonilla MRN: [id number] Date of Birth: 1955/06/22

## 2020-07-20 NOTE — Therapy (Signed)
[place] [place], [name], [address] Phone: [phone]   Fax:  [phone]  Physical Therapy Evaluation  Patient Details  Name: Jacqueline Bonilla MRN: [id number] Date of Birth: 06-Feb-1955 Referring Provider (PT): Sagardia   Encounter Date: 07/20/2020   PT End of Session - 07/20/20 0956     Visit Number 1    Date for PT Re-Evaluation 10/12/20    PT Start Time 0916    PT Stop Time 0956    PT Time Calculation (min) 40 min    Activity Tolerance Patient tolerated treatment well    Behavior During Therapy [facility] for tasks assessed/performed             Past Medical History:  Diagnosis Date   Anemia    Anxiety    Depression    Dysmenorrhea    Endometriosis    Fibroid    Hypertension    Microhematuria    negative workup   Osteoporosis    Tachycardia    Thrombocytopenia ([place])     Past Surgical History:  Procedure Laterality Date   BREAST BIOPSY     CESAREAN SECTION     hysteroscopic resection     implantable loop recorder implant  10/21/2019   Medtronic Reveal Linq model LNQ 22 ([place] [id number]) implantable loop recorder    Vitals:   07/20/20 0920  BP: (!) 143/96  Pulse: 76  SpO2: 99%      Subjective Assessment - 07/20/20 0920     Subjective Pt reports to clinic with c/o L sided weakness following CVA 06/24/20. Was exercising on the treadmill and fell secondary to CVA. She states that she has been doing pretty well since she got home. Pt ambulates without an AD into clinic. Pt states that she is doing a couple of home exercises and also has started back on the nustep at the [facility]; she would like to get back to her gym routine along with being independent in all tasks at home. Pt states that she has had a prior stroke which mainly affected her R side; current CVA predominantly affecting L side especially with L foot drop. Pt states she has been catching her L foot sometimes and this causes her to  stumble, states no falls. Reports improved since hospital d/c.    Pertinent History prior CVA affecting R side, HTN    Limitations Standing;Walking;House hold activities    Patient Stated Goals be more mobile, improve balance,  increase independence    Currently in Pain? No/denies    Pain Score 0-No pain                [facility] PT Assessment - 07/20/20 0001       Assessment   Medical Diagnosis cerebral infarction    Referring Provider (PT) Sagardia    Onset Date/Surgical Date 06/24/20    Hand Dominance Right    Next MD Visit 08/25/20    Prior Therapy Yes      Precautions   Precautions Fall      Restrictions   Weight Bearing Restrictions No      Balance Screen   Has the patient fallen in the past 6 months Yes    How many times? 1    Has the patient had a decrease in activity level because of a fear of falling?  No    Is the patient reluctant to leave their home because of a fear of falling?  No  Home Environment   Additional Comments husband built ramp to get into house; no stairs in house      Prior Function   Vocation Retired    [employer]    Leisure going on walks, exercise      Sensation   Light Touch Appears Intact      Coordination   Heel Shin Test mild deficits BLE      Functional Tests   Functional tests Sit to Stand      Sit to Stand   Comments occasional locking of LEs on back of chair; fatigues quickly, difficulty with eccentric control with increasing fatigue      Tone   Assessment Location Right Lower Extremity;Left Lower Extremity      ROM / Strength   AROM / PROM / Strength Strength      Strength   Strength Assessment Site Hip;Knee;Ankle    Right/Left Hip Right;Left    Right Hip Flexion 4+/5    Right Hip Extension 4+/5    Right Hip ABduction 4/5    Left Hip Flexion 4/5    Left Hip Extension 4/5    Left Hip ABduction 4/5    Right/Left Knee Right;Left    Right Knee Flexion 5/5    Right Knee Extension 5/5     Left Knee Flexion 5/5    Left Knee Extension 5/5    Right/Left Ankle Right;Left    Right Ankle Dorsiflexion 5/5    Right Ankle Plantar Flexion 5/5    Left Ankle Dorsiflexion 4-/5    Left Ankle Plantar Flexion 4-/5      Transfers   Five time sit to stand comments  17.72 sec      Ambulation/Gait   Gait Comments gait with no AD around clinic. Widened BOS, decreased step length B, decreased foot clearance LLE      Standardized Balance Assessment   Standardized Balance Assessment Berg Balance Test      Berg Balance Test   Sit to Stand Able to stand without using hands and stabilize independently    Standing Unsupported Able to stand safely 2 minutes    Sitting with Back Unsupported but Feet Supported on Floor or Stool Able to sit safely and securely 2 minutes    Stand to Sit Sits safely with minimal use of hands    Transfers Able to transfer safely, minor use of hands    Standing Unsupported with Eyes Closed Able to stand 10 seconds with supervision    Standing Unsupported with Feet Together Able to place feet together independently and stand 1 minute safely    From Standing, Reach Forward with Outstretched Arm Can reach forward >12 cm safely (5")    From Standing Position, Pick up Object from Floor Able to pick up shoe, needs supervision    From Standing Position, Turn to Look Behind Over each Shoulder Looks behind from both sides and weight shifts well    Turn 360 Degrees Able to turn 360 degrees safely but slowly    Standing Unsupported, Alternately Place Feet on Step/Stool Able to complete 4 steps without aid or supervision    Standing Unsupported, One Foot in Front Able to take small step independently and hold 30 seconds    Standing on One Leg Tries to lift leg/unable to hold 3 seconds but remains standing independently    Total Score 44      RLE Tone   RLE Tone Modified Ashworth      RLE Tone  Modified Ashworth Scale for Grading Hypertonia RLE Slight increase in muscle tone,  manifested by a catch and release or by minimal resistance at the end of the range of motion when the affected part(s) is moved in flexion or extension      LLE Tone   LLE Tone Modified Ashworth      LLE Tone   Modified Ashworth Scale for Grading Hypertonia LLE Slight increase in muscle tone, manifested by a catch and release or by minimal resistance at the end of the range of motion when the affected part(s) is moved in flexion or extension                        Objective measurements completed on examination: See above findings.               PT Education - 07/20/20 0955     Education Details Pt educated on POC and HEP    Person(s) Educated Patient    Methods Explanation;Demonstration;Handout    Comprehension Verbalized understanding;Returned demonstration              PT Short Term Goals - 07/20/20 1117       PT SHORT TERM GOAL #1   Title Pt will be I with initial HEP    Time 2    Period Weeks    Status New    Target Date 08/03/20               PT Long Term Goals - 07/20/20 1117       PT LONG TERM GOAL #1   Title Pt will demo improved Berg Balance score to 52/56 to demo decreased fall risk    Baseline 44/56    Time 6    Period Weeks    Status New    Target Date 08/31/20      PT LONG TERM GOAL #2   Title Pt will demo 5x STS <15 seconds with no UE assist    Baseline 17 seconds with occasional UE assist and LEs intermittently locking on back of chair    Time 6    Period Weeks    Status New    Target Date 08/31/20      PT LONG TERM GOAL #3   Title Pt will demo gait >500 ft over level/unlevel surfaces with no AD and supervision assist    Time 6    Period Weeks    Status New    Target Date 08/31/20      PT LONG TERM GOAL #4   Title Pt will report able to safely return to gym routine at [facility]    Time 6    Period Weeks    Status New    Target Date 08/31/20                    Plan - 07/20/20 1109     Clinical  Impression Statement Pt presents to clinic s/p cerebral infarct sustained 06/24/20. Most recent infarct affected L side; however, pt has hx of prior CVA affecting R side with some residual deficits. Pt demos elevated BP of 146/96 this rx at rest. Educated on BP parameters, need to check BP daily and esp before exercise with pt VU and agreement. Plan to check vitals prior to activity and contact MD if BP continues to be elevated. Ambulates into clinic with no AD, widened BOS, and decreased LLE foot clearance. Per hospital notes, pt was  experiencing significant L foot drop; this seems to be improving but still demos decreased L ankle DF ROM and strength as compared to R. Pt demos fall risk per Merrilee Jansky Balance score. Pt would benefit from skilled PT to address functional strengthening, L ankle DF ROM/strength, and high level balance ex's in order to return to PLOF.    Personal Factors and Comorbidities Comorbidity 2    Comorbidities hx of CVA, HTN    Examination-Activity Limitations Stand;Locomotion Level    Examination-Participation Restrictions Community Activity;Interpersonal Relationship    Stability/Clinical Decision Making Evolving/Moderate complexity    Clinical Decision Making Moderate    Rehab Potential Good    PT Frequency 1x / week   may increase freqency to 2x/week if indicated   PT Duration 6 weeks    PT Treatment/Interventions ADLs/Self Care Home Management;Gait training;Functional mobility training;Therapeutic activities;Therapeutic exercise;Balance training;Neuromuscular re-education;Manual techniques;Patient/family education;Passive range of motion    PT Next Visit Plan check vitals prior to exercise, review/progress HEP, high level balance ex's, gait training over level/unlevel surfaces, functional LE strengthening, help pt return to [facility] routine safely    PT Home Exercise Plan see pt instructions    Consulted and Agree with Plan of Care Patient             Patient will benefit from  skilled therapeutic intervention in order to improve the following deficits and impairments:  Abnormal gait, Difficulty walking, Decreased range of motion, Impaired tone, Decreased endurance, Decreased safety awareness, Decreased activity tolerance, Decreased balance, Decreased mobility, Decreased cognition, Decreased strength  Visit Diagnosis: Balance problem  Difficulty in walking, not elsewhere classified  Other abnormalities of gait and mobility  Muscle weakness (generalized)     Problem List Patient Active Problem List   Diagnosis Date Noted   History of stroke 07/14/2020   Azotemia 07/11/2020   Anemia 07/11/2020   CVA (cerebral vascular accident) ([place]) 06/28/2020   Cerebral infarction ([place]) 06/25/2020   Acute left-sided weakness 06/24/2020   Vascular dementia with behavior disturbance ([place]) 10/13/2019   Vascular dementia without behavioral disturbance ([place]) 07/15/2019   Cerebrovascular accident ([place]) 06/25/2019   Brain fog 04/22/2019   History of COVID-19 04/22/2019   Essential hypertension 03/03/2019   Hypertensive urgency 03/03/2019   Amador Cunas, PT, DPT Donald Prose Kealii Thueson 07/20/2020, 11:20 AM  [place] [place], [name], [address] Phone: [phone]   Fax:  [phone]  Name: Jacqueline Bonilla MRN: [id number] Date of Birth: 1955/06/03

## 2020-07-20 NOTE — Progress Notes (Signed)
Subjective:    Patient ID: Jacqueline Bonilla, female    DOB: 1955-11-21, 65 y.o.   MRN: [id number]  HPI: Jacqueline Bonilla is a 65 y.o. female who is here for HFU appointment of her CVA, History of Stroke and Essential Hypertension. On 06/24/2020, Jacqueline Bonilla was at a gym walking on treadmill she decided take a break and stop and felt fatigued.  Suddenly she felt her left side leg giveaway.  She continued to feel weak in her left leg and left arm and called 911 concerned that she may have had another stroke.  She was brought to [facility] via EMS and Code Stroke was initiated, neurology consulted.   CT Head WO Contrast:  IMPRESSION: 1. No acute abnormality 2. Atrophy and chronic ischemic change.  No intracranial hemorrhage.  MR Brain: MR Angio IMPRESSION: MRI:   1. Punctate acute infarct in the high left frontal cortex. Additional faint punctate areas of DWI signal in bilateral precentral gyri are suspicious for additional acute or subacute infarcts, but could potentially represent artifact. Given potential involvement of multiple vascular territories, consider a central embolic etiology. 2. 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 3. Moderate atrophy.   MRA:   No large vessel occlusion or proximal flow limiting stenosis.  Neurology recommended aspirin and Plavix x 3 weeks followed by Plavix alone.   Jacqueline Bonilla was admitted to inpatient rehabilitation on 06/28/2020 and discharged home on 07/08/2020, she is receiving [facility]. She denies any pain. She rates her pain 0. Also reports she has a good appetite.   Jacqueline Bonilla reports she has stopped taking her atorvastatin due to diarrhea, she was instructed to call her PCP regarding the above, she verbalizes understanding. She Bonilla her PCP on 07/14/2020. Also sent Dr Mitchel Honour a staff message regarding the  above.   Jacqueline Bonilla arrived to office with uncontrolled Hypertension, she states she is compliant with her anti-hypertensive medication. Blood pressure was rechecked and she was encouraged to keep a log and report readings to her PCP, she verbalizes understanding.        Pain Inventory Average Pain 0 Pain Right Now 0 My pain is  no pain  LOCATION OF PAIN  no pain  BOWEL Number of stools per week: 7-14 Oral laxative use No  Type of laxative n/a Enema or suppository use No  History of colostomy No  Incontinent No   BLADDER Normal  Able to self cath No  Bladder incontinence No  Frequent urination No  Leakage with coughing No  Difficulty starting stream No  Incomplete bladder emptying No    Mobility walk without assistance how many minutes can you walk? Several  ability to climb steps?  yes  Function retired  Neuro/Psych weakness numbness  Prior Studies Any changes since last visit?  no   Physicians involved in your care N/a   Family History  Problem Relation Age of Onset   Cancer Father        non hodgkin lymphoma & skin   Heart attack Maternal Grandfather    Dementia Mother    Polymyositis Sister    Social History   Socioeconomic History   Marital status: Married    Spouse name: Not on file   Number of children: 1   Years of education: Not on file   Highest education level: Not on file  Occupational History   Occupation: accounting  Tobacco Use  Smoking status: Never   Smokeless tobacco: Never  Vaping Use   Vaping Use: Never used  Substance and Sexual Activity   Alcohol use: Never   Drug use: Never   Sexual activity: Not Currently    Partners: Male    Comment: husband vasectomy  Other Topics Concern   Not on file  Social History Narrative   Lives with husband   Grandchildren - 1   Works - Investment banker, 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%   Gun in home - yes - secured      Right handed   Caffeine: maybe tea every now and then   Social  Determinants of Radio broadcast assistant Strain: Not on file  Food Insecurity: Not on file  Transportation Needs: Not on file  Physical Activity: Not on file  Stress: Not on file  Social Connections: Not on file   Past Surgical History:  Procedure Laterality Date   [place]     hysteroscopic resection     implantable loop recorder implant  10/21/2019   Medtronic Reveal Linq model LNQ 22 ([place] [id number]) implantable loop recorder   Past Medical History:  Diagnosis Date   Anemia    Anxiety    Depression    Dysmenorrhea    Endometriosis    Fibroid    Hypertension    Microhematuria    negative workup   Osteoporosis    Tachycardia    Thrombocytopenia ([facility])    BP (!) 164/103 (BP Location: Right Arm)   Pulse 87   Temp 98.4 F (36.9 C) (Oral)   Ht 5\' 4"  (1.626 m)   Wt 117 lb (53.1 kg)   LMP 01/02/2003   SpO2 99%   BMI 20.08 kg/m   Opioid Risk Score:   Fall Risk Score:  `1  Depression screen PHQ 2/9  Depression screen [facility] 2/9 07/14/2020 01/18/2020 12/03/2019 07/20/2019 07/15/2019 04/22/2019 03/03/2019  Decreased Interest 0 0 0 0 0 0 0  Down, Depressed, Hopeless 0 0 0 0 0 0 0  PHQ - 2 Score 0 0 0 0 0 0 0       Review of Systems     Objective:   Physical Exam Vitals and nursing note reviewed.  Constitutional:      Appearance: Normal appearance.  Cardiovascular:     Rate and Rhythm: Normal rate and regular rhythm.     Pulses: Normal pulses.     Heart sounds: Normal heart sounds.  Pulmonary:     Effort: Pulmonary effort is normal.     Breath sounds: Normal breath sounds.  Musculoskeletal:     Cervical back: Normal range of motion and neck supple.     Comments: Normal Muscle Bulk and Muscle Testing Reveals:  Upper Extremities: Full ROM and Muscle Strength on Right 3/5 and Left 5/5 Lower Extremities : Full ROM and Muscle Strength 5/5 Arises from table with ease Narrow Based Gait     Skin:    General: Skin is warm and dry.   Neurological:     Mental Status: She is alert and oriented to person, place, and time.  Psychiatric:        Mood and Affect: Mood normal.        Behavior: Behavior normal.         Assessment & Plan:  1.CVA, History of Stroke: Continue outpatient therapy at [facility]. Continue current medication regimen. Continue to monitor.  2. Essential Hypertension.Continue  current medication regimen. PCP Following.   F/U with Dr Letta Pate in 4-6 weeks

## 2020-07-20 NOTE — Patient Instructions (Signed)
Access Code: [email or web address] URL: [email or web address] Date: 07/20/2020 Prepared by: Amador Cunas  Exercises Sit to Stand Without Arm Support - 1 x daily - 7 x weekly - 2 sets - 10 reps Standing Tandem Balance with Counter Support - 1 x daily - 7 x weekly - 2 sets - 2 reps - 15-20 sec hold Standing Romberg to 1/2 Tandem Stance - 1 x daily - 7 x weekly - 2 sets - 2 reps - 15-20 sec hold Backward Walking with Counter Support - 1 x daily - 7 x weekly - 3 sets - 10 reps

## 2020-07-21 ENCOUNTER — Other Ambulatory Visit: Payer: Self-pay | Admitting: Emergency Medicine

## 2020-07-21 DIAGNOSIS — I10 Essential (primary) hypertension: Secondary | ICD-10-CM

## 2020-07-27 ENCOUNTER — Other Ambulatory Visit: Payer: Self-pay

## 2020-07-27 ENCOUNTER — Ambulatory Visit: Payer: Medicare HMO | Admitting: Physical Therapy

## 2020-07-27 ENCOUNTER — Encounter: Payer: Self-pay | Admitting: Physical Therapy

## 2020-07-27 ENCOUNTER — Ambulatory Visit: Payer: Medicare HMO | Admitting: Occupational Therapy

## 2020-07-27 ENCOUNTER — Encounter: Payer: Self-pay | Admitting: Occupational Therapy

## 2020-07-27 VITALS — BP 146/97 | HR 91

## 2020-07-27 DIAGNOSIS — R2689 Other abnormalities of gait and mobility: Secondary | ICD-10-CM

## 2020-07-27 DIAGNOSIS — R41844 Frontal lobe and executive function deficit: Secondary | ICD-10-CM | POA: Diagnosis not present

## 2020-07-27 DIAGNOSIS — R41841 Cognitive communication deficit: Secondary | ICD-10-CM | POA: Diagnosis not present

## 2020-07-27 DIAGNOSIS — M6281 Muscle weakness (generalized): Secondary | ICD-10-CM

## 2020-07-27 DIAGNOSIS — R262 Difficulty in walking, not elsewhere classified: Secondary | ICD-10-CM | POA: Diagnosis not present

## 2020-07-27 DIAGNOSIS — R41842 Visuospatial deficit: Secondary | ICD-10-CM | POA: Diagnosis not present

## 2020-07-27 DIAGNOSIS — R278 Other lack of coordination: Secondary | ICD-10-CM | POA: Diagnosis not present

## 2020-07-27 NOTE — Therapy (Signed)
[address] [place], [name], [address] Phone: [phone]   Fax:  [phone]  Physical Therapy Treatment  Patient Details  Name: Jacqueline Bonilla MRN: [id number] Date of Birth: December 02, 1955 Referring Provider (PT): Sagardia   Encounter Date: 07/27/2020   PT End of Session - 07/27/20 1123     Visit Number 2    Date for PT Re-Evaluation 10/12/20    PT Start Time 1000    PT Stop Time 1044    PT Time Calculation (min) 44 min    Activity Tolerance Patient tolerated treatment well    Behavior During Therapy [facility] for tasks assessed/performed             Past Medical History:  Diagnosis Date   Anemia    Anxiety    Depression    Dysmenorrhea    Endometriosis    Fibroid    Hypertension    Microhematuria    negative workup   Osteoporosis    Tachycardia    Thrombocytopenia ([place])     Past Surgical History:  Procedure Laterality Date   BREAST BIOPSY     CESAREAN SECTION     hysteroscopic resection     implantable loop recorder implant  10/21/2019   Medtronic Reveal Linq model LNQ 22 ([place] [id number]) implantable loop recorder    Vitals:   07/27/20 0958  BP: (!) 146/97  Pulse: 91     Subjective Assessment - 07/27/20 0958     Subjective Pt states no new falls or changes since last rx. Saw MD and will carry on with current BP meds and monitor before exercise. Pt to contact PCP if high BP continues.    Currently in Pain? No/denies    Pain Score 0-No pain                               [facility] Adult PT Treatment/Exercise - 07/27/20 0001       Exercises   Exercises Knee/Hip      Knee/Hip Exercises: Aerobic   Nustep L5 x 6 min LE/UE      Knee/Hip Exercises: Seated   Long Arc Quad Both;2 sets;10 reps    Long Arc Quad Weight 3 lbs.    Marching Both;2 sets;10 reps    Marching Weights 3 lbs.    Hamstring Curl Both;2 sets;10 reps    Hamstring Limitations green tb    Sit to Sand  10 reps;without UE support;1 set   with green tb above knees and cues for anterior lean with STS     Knee/Hip Exercises: Supine   Bridges with Ball Squeeze Both;2 sets;10 reps    Bridges with Clamshell Both;2 sets;10 reps   green tb   Other Supine Knee/Hip Exercises supine marches 2x5 BLE                      PT Short Term Goals - 07/27/20 1126       PT SHORT TERM GOAL #1   Title Pt will be I with initial HEP    Time 2    Period Weeks    Status Partially Met    Target Date 08/03/20               PT Long Term Goals - 07/20/20 1117       PT LONG TERM GOAL #1   Title Pt will demo improved Merrilee Jansky  Balance score to 52/56 to demo decreased fall risk    Baseline 44/56    Time 6    Period Weeks    Status New    Target Date 08/31/20      PT LONG TERM GOAL #2   Title Pt will demo 5x STS <15 seconds with no UE assist    Baseline 17 seconds with occasional UE assist and LEs intermittently locking on back of chair    Time 6    Period Weeks    Status New    Target Date 08/31/20      PT LONG TERM GOAL #3   Title Pt will demo gait >500 ft over level/unlevel surfaces with no AD and supervision assist    Time 6    Period Weeks    Status New    Target Date 08/31/20      PT LONG TERM GOAL #4   Title Pt will report able to safely return to gym routine at [facility]    Time 6    Period Weeks    Status New    Target Date 08/31/20                   Plan - 07/27/20 1124     Clinical Impression Statement Pt tolerated progression to TE well with no c/o increased pain or LOB during session. Pt does continue to demo elevated BP to 147/96, unchanged after exercise or rest. Kept ex's light today d/t BP; mostly seated/supine strengthening. Printed out updated ex's per pt request. Progress to high level balance and functional strengthening ex's next rx, monitor vitals throughout.    PT Treatment/Interventions ADLs/Self Care Home Management;Gait training;Functional mobility  training;Therapeutic activities;Therapeutic exercise;Balance training;Neuromuscular re-education;Manual techniques;Patient/family education;Passive range of motion    PT Next Visit Plan check vitals prior to exercise, review/progress HEP, high level balance ex's, gait training over level/unlevel surfaces, functional LE strengthening, help pt return to [facility] routine safely    Consulted and Agree with Plan of Care Patient             Patient will benefit from skilled therapeutic intervention in order to improve the following deficits and impairments:  Abnormal gait, Difficulty walking, Decreased range of motion, Impaired tone, Decreased endurance, Decreased safety awareness, Decreased activity tolerance, Decreased balance, Decreased mobility, Decreased cognition, Decreased strength  Visit Diagnosis: Balance problem  Difficulty in walking, not elsewhere classified  Other abnormalities of gait and mobility  Muscle weakness (generalized)     Problem List Patient Active Problem List   Diagnosis Date Noted   History of stroke 07/14/2020   Azotemia 07/11/2020   Anemia 07/11/2020   CVA (cerebral vascular accident) ([place]) 06/28/2020   Cerebral infarction ([place]) 06/25/2020   Acute left-sided weakness 06/24/2020   Vascular dementia with behavior disturbance ([place]) 10/13/2019   Vascular dementia without behavioral disturbance ([place]) 07/15/2019   Cerebrovascular accident ([place]) 06/25/2019   Brain fog 04/22/2019   History of COVID-19 04/22/2019   Essential hypertension 03/03/2019   Hypertensive urgency 03/03/2019   Amador Cunas, PT, DPT Donald Prose Shameria Trimarco 07/27/2020, 11:26 AM  [place]. [place], [name], [address] Phone: [phone]   Fax:  [phone]  Name: Jacqueline Bonilla MRN: [id number] Date of Birth: 04/01/1955

## 2020-07-27 NOTE — Therapy (Signed)
[place]. [place], [name], [address] Phone: [phone]   Fax:  [phone]  Occupational Therapy Treatment  Patient Details  Name: Jacqueline Bonilla MRN: [id number] Date of Birth: 1955/02/07 Referring Provider (OT): Lauraine Rinne, PA-C   Encounter Date: 07/27/2020   OT End of Session - 07/27/20 1059     Visit Number 3    Number of Visits 9    Date for OT Re-Evaluation 10/05/20    Authorization Type [employer]    Authorization - Visit Number 2    Authorization - Number of Visits 9   by 09/16/09   Activity Tolerance Patient tolerated treatment well    Behavior During Therapy [facility] for tasks assessed/performed             Past Medical History:  Diagnosis Date   Anemia    Anxiety    Depression    Dysmenorrhea    Endometriosis    Fibroid    Hypertension    Microhematuria    negative workup   Osteoporosis    Tachycardia    Thrombocytopenia [facility])     Past Surgical History:  Procedure Laterality Date   BREAST BIOPSY     CESAREAN SECTION     hysteroscopic resection     implantable loop recorder implant  10/21/2019   Medtronic Reveal Linq model LNQ 22 ([place] [id number]) implantable loop recorder    There were no vitals filed for this visit.   Subjective Assessment - 07/27/20 1101     Subjective  "I had a good session this morning and am ready to get going"    Pertinent History "Bilateral scattered infarcts" (per Dr. Erlinda Hong progress note on 06/25/20); PMHx includes history of CVA w/ residual RUE deficits, vascular dementia w/ behavior disturbance, HTN, thrombocytopenia, and ILR    Patient Stated Goals Get strength back in (R) hand; "write better"    Currently in Pain? No/denies             Treatment/Exercises - 07/27/20    Coordination Activities Reviewed coorindation activities including rotating ball/pen in-hand, tossing ball between hands and w/ ipsilateral UE, flipping cards off deck,  rotating cards end-over-end and clockwise, pushing cards off deck w/ thumbs, and picking up coins and placing into a container/stack     Functional Mobility Ambulating around busy environment (therapy gym) while tossing scarf and catching w/ ipsilateral LUE. Activity facilitated high level functional mobility, UE coordination, GMC, visual perception, and processing speed/reaction time. OT graded activity up w/ pt identifying food/animals while tossing object. Ambulation w/ CGA for safety; requiring cues for pacing    Visual Perception Trail-making activity, finding and erasing numbers in sequential order completed on window to facilitate visual, depth, and figure-ground perception; using index finger to erase targets w/out crossing over border to address [facility] and coordination. Completed activity standing on balance pad.  Trail-making activity, alternating between numbers and letters, completed on window to facilitate figure-ground perception, scanning, processing speed, and eye-hand coordination            OT Education - 07/27/20 1133     Education Details Reviewed coorindation activities; handout administered    Person(s) Educated Patient    Methods Explanation;Demonstration;Handout    Comprehension Verbalized understanding;Returned demonstration             OT Short Term Goals - 07/20/20 0810       OT SHORT TERM GOAL #1   Title Pt will improve bilateral [place] for  functional tasks as evidenced by decreasing 9-HPT time by at least 4 sec w/ each hand    Baseline R hand 50 sec; L hand 39 sec    Time 4    Period Weeks    Status On-going    Target Date 08/12/20      OT SHORT TERM GOAL #2   Title Pt w/ demo independence w/ compensatory strategies or AE prn to improve participation in handwriting    Baseline Reports difficulty w/ handwriting; decreased speed and legibility    Time 4    Period Weeks    Status On-going             OT Long Term Goals - 07/20/20 0810       OT  LONG TERM GOAL #1   Title Pt will be independent w/ full HEP designed for BUE strengthening, GM/FMC, and coordination by discharge    Baseline No HEP at this time    Time 8    Period Weeks    Status On-going      OT LONG TERM GOAL #2   Title Pt will improve GM control and coordination for improved participation in ADLs as evidenced by increasing Box and Blocks score by at least 5 blocks w/ both UEs    Baseline R hand 24 blocks; L hand 30 blocks    Time 8    Period Weeks    Status On-going      OT LONG TERM GOAL #3   Title Pt will demonstrate improved visual perceptual skills as evidenced by decreasing Trail Making Test: Part A time by at least 10 seconds    Baseline TMT: Part A 1 min, 58 sec w/ 1 error    Time 8    Period Weeks    Status On-going             Plan - 07/27/20 1134     Clinical Impression Statement Pt did well w/ coordination activities this session and handout was provided. OT facilitated higher level coordination and visual perceptual activities this session, w/ pt requiring standby assist for balance and min cueing for sequencing.    OT Occupational Profile and History Detailed Assessment- Review of Records and additional review of physical, cognitive, psychosocial history related to current functional performance    Occupational performance deficits (Please refer to evaluation for details): ADL's;IADL's;Leisure;Social Participation    Body Structure / Function / Physical Skills ADL;Decreased knowledge of use of DME;Strength;Balance;Dexterity;GMC;Tone;UE functional use;Body mechanics;Endurance;IADL;ROM;Vision;Coordination;Mobility;FMC    Cognitive Skills Memory;Perception;Problem Solve;Safety Awareness;Sequencing    Psychosocial Skills Environmental  Adaptations    Rehab Potential Fair    Clinical Decision Making Several treatment options, min-mod task modification necessary    Comorbidities Affecting Occupational Performance: Presence of comorbidities impacting  occupational performance    Comorbidities impacting occupational performance description: Prior CVA affecting RUE; vascular dementia with behavior disturbance    Modification or Assistance to Complete Evaluation  No modification of tasks or assist necessary to complete eval    OT Frequency 1x / week    OT Duration 8 weeks    OT Treatment/Interventions Self-care/ADL training;DME and/or AE instruction;Balance training;Aquatic Therapy;Therapeutic activities;Therapeutic exercise;Cognitive remediation/compensation;Neuromuscular education;Functional Mobility Training;Visual/perceptual remediation/compensation;Energy conservation;Manual Therapy;Patient/family education    Plan [place] (tossing large ball; walking and catching a scarf; balloon tennis); handwriting; re-assess 9-HPT    Consulted and Agree with Plan of Care Patient             Patient will benefit from skilled therapeutic intervention in order  to improve the following deficits and impairments:   Body Structure / Function / Physical Skills: ADL, Decreased knowledge of use of DME, Strength, Balance, Dexterity, GMC, Tone, UE functional use, Body mechanics, Endurance, IADL, ROM, Vision, Coordination, Mobility, [facility] Cognitive Skills: Memory, Perception, Problem Solve, Safety Awareness, Sequencing Psychosocial Skills: Environmental  Adaptations   Visit Diagnosis: Other lack of coordination  Visuospatial deficit  Frontal lobe and executive function deficit  Muscle weakness (generalized)    Problem List Patient Active Problem List   Diagnosis Date Noted   History of stroke 07/14/2020   Azotemia 07/11/2020   Anemia 07/11/2020   CVA (cerebral vascular accident) ([place]) 06/28/2020   Cerebral infarction ([place]) 06/25/2020   Acute left-sided weakness 06/24/2020   Vascular dementia with behavior disturbance ([place]) 10/13/2019   Vascular dementia without behavioral disturbance ([place]) 07/15/2019   Cerebrovascular accident ([place]) 06/25/2019    Brain fog 04/22/2019   History of COVID-19 04/22/2019   Essential hypertension 03/03/2019   Hypertensive urgency 03/03/2019     Kathrine Cords, OTR/L, MSOT 07/27/2020, 11:39 AM  [place]. [place], [name], [address] Phone: [phone]   Fax:  [phone]  Name: ORVETTA BARRIENTEZ MRN: [id number] Date of Birth: 14-Sep-1955

## 2020-07-27 NOTE — Patient Instructions (Signed)
Access Code: [email or web address] URL: [email or web address] Date: 07/27/2020 Prepared by: Amador Cunas  Exercises Supine Bridge with Resistance Band - 1 x daily - 7 x weekly - 2 sets - 10 reps Supine Bridge with Mini Swiss Ball Between Knees - 1 x daily - 7 x weekly - 2 sets - 10 reps Marching Bridge - 1 x daily - 7 x weekly - 2 sets - 10 reps Seated Long Arc Quad - 1 x daily - 7 x weekly - 2 sets - 10 reps

## 2020-07-28 NOTE — Progress Notes (Signed)
Carelink Summary Report / Loop Recorder 

## 2020-08-03 ENCOUNTER — Encounter: Payer: Self-pay | Admitting: Physical Therapy

## 2020-08-03 ENCOUNTER — Ambulatory Visit: Payer: Medicare HMO | Admitting: Occupational Therapy

## 2020-08-03 ENCOUNTER — Ambulatory Visit: Payer: Medicare HMO | Attending: Physician Assistant | Admitting: Physical Therapy

## 2020-08-03 ENCOUNTER — Other Ambulatory Visit: Payer: Self-pay

## 2020-08-03 VITALS — BP 143/80

## 2020-08-03 DIAGNOSIS — R278 Other lack of coordination: Secondary | ICD-10-CM | POA: Diagnosis not present

## 2020-08-03 DIAGNOSIS — M6281 Muscle weakness (generalized): Secondary | ICD-10-CM | POA: Diagnosis not present

## 2020-08-03 DIAGNOSIS — R41842 Visuospatial deficit: Secondary | ICD-10-CM | POA: Diagnosis not present

## 2020-08-03 DIAGNOSIS — R2689 Other abnormalities of gait and mobility: Secondary | ICD-10-CM | POA: Diagnosis not present

## 2020-08-03 DIAGNOSIS — R262 Difficulty in walking, not elsewhere classified: Secondary | ICD-10-CM | POA: Diagnosis not present

## 2020-08-03 DIAGNOSIS — R41844 Frontal lobe and executive function deficit: Secondary | ICD-10-CM | POA: Insufficient documentation

## 2020-08-03 NOTE — Therapy (Signed)
[place]. [place], [name], [address] Phone: [phone]   Fax:  [phone]  Physical Therapy Treatment  Patient Details  Name: Jacqueline Bonilla MRN: [id number] Date of Birth: 1955/05/25 Referring Provider (PT): Sagardia   Encounter Date: 08/03/2020   PT End of Session - 08/03/20 1144     Visit Number 3    Date for PT Re-Evaluation 10/12/20    PT Start Time 1111   pt late   PT Stop Time 1141    PT Time Calculation (min) 30 min    Activity Tolerance Patient tolerated treatment well    Behavior During Therapy [name] for tasks assessed/performed             Past Medical History:  Diagnosis Date   Anemia    Anxiety    Depression    Dysmenorrhea    Endometriosis    Fibroid    Hypertension    Microhematuria    negative workup   Osteoporosis    Tachycardia    Thrombocytopenia ([place])     Past Surgical History:  Procedure Laterality Date   BREAST BIOPSY     CESAREAN SECTION     hysteroscopic resection     implantable loop recorder implant  10/21/2019   Medtronic Reveal Ai model LNQ 22 ([place] [id number]) implantable loop recorder    Vitals:   08/03/20 1111  BP: (!) 143/80     Subjective Assessment - 08/03/20 1114     Subjective Was in a bit of a rush getting here.    Pertinent History prior CVA affecting R side, HTN    Patient Stated Goals be more mobile, improve balance,  increase independence    Currently in Pain? No/denies                               [facility] Adult PT Treatment/Exercise - 08/03/20 0001       Neuro Re-ed    Neuro Re-ed Details  walking on toes/heels in parallel bars with CGA/SBA   cueing to increase step length     Knee/Hip Exercises: Aerobic   Nustep L5 x 6 min LE/UE      Knee/Hip Exercises: Machines for Strengthening   Cybex Knee Extension B LEs 10x 7#, L LE only 10x 5#    Cybex Knee Flexion B LEs 10x 10#, L LE only 10x 10#      Knee/Hip  Exercises: Seated   Sit to Sand without UE support;2 sets;5 reps   L foot back; cueing to increase anterior trunk lean                   PT Education - 08/03/20 1143     Education Details update to HEP    Person(s) Educated Patient    Methods Explanation;Demonstration;Tactile cues;Verbal cues;Handout    Comprehension Verbalized understanding;Returned demonstration              PT Short Term Goals - 07/27/20 1126       PT SHORT TERM GOAL #1   Title Pt will be I with initial HEP    Time 2    Period Weeks    Status Partially Met    Target Date 08/03/20               PT Long Term Goals - 08/03/20 1147       PT LONG TERM GOAL #1  Title Pt will demo improved Berg Balance score to 52/56 to demo decreased fall risk    Baseline 44/56    Time 6    Period Weeks    Status On-going      PT LONG TERM GOAL #2   Title Pt will demo 5x STS <15 seconds with no UE assist    Baseline 17 seconds with occasional UE assist and LEs intermittently locking on back of chair    Time 6    Period Weeks    Status On-going      PT LONG TERM GOAL #3   Title Pt will demo gait >500 ft over level/unlevel surfaces with no AD and supervision assist    Time 6    Period Weeks    Status On-going      PT LONG TERM GOAL #4   Title Pt will report able to safely return to gym routine at [facility]    Time 6    Period Weeks    Status On-going                   Plan - 08/03/20 1144     Clinical Impression Statement Patient arrived to session late and reports being in a rush as a result, likely leading to elevated systolic BP this AM. Patient asymptomatic at start of session however. Patient reports that she currently does exercise at the [facility]. Worked on reviewing proper form and appropriate levels of resistance with B and L LE strengthening on machines. STS training was focused on L LE bias with patient initially demonstrating difficulty and tendency for compensations, which  improved with cueing for increased anterior trunk lean. Patient tolerated duration of session without complaints.    Comorbidities hx of CVA, HTN    PT Treatment/Interventions ADLs/Self Care Home Management;Gait training;Functional mobility training;Therapeutic activities;Therapeutic exercise;Balance training;Neuromuscular re-education;Manual techniques;Patient/family education;Passive range of motion    PT Next Visit Plan check vitals prior to exercise, review/progress HEP, high level balance ex's, gait training over level/unlevel surfaces, functional LE strengthening, help pt return to [facility] routine safely    Consulted and Agree with Plan of Care Patient             Patient will benefit from skilled therapeutic intervention in order to improve the following deficits and impairments:  Abnormal gait, Difficulty walking, Decreased range of motion, Impaired tone, Decreased endurance, Decreased safety awareness, Decreased activity tolerance, Decreased balance, Decreased mobility, Decreased cognition, Decreased strength  Visit Diagnosis: Balance problem  Difficulty in walking, not elsewhere classified  Other abnormalities of gait and mobility  Muscle weakness (generalized)     Problem List Patient Active Problem List   Diagnosis Date Noted   History of stroke 07/14/2020   Azotemia 07/11/2020   Anemia 07/11/2020   CVA (cerebral vascular accident) ([place]) 06/28/2020   Cerebral infarction ([place]) 06/25/2020   Acute left-sided weakness 06/24/2020   Vascular dementia with behavior disturbance ([place]) 10/13/2019   Vascular dementia without behavioral disturbance ([place]) 07/15/2019   Cerebrovascular accident ([place]) 06/25/2019   Brain fog 04/22/2019   History of COVID-19 04/22/2019   Essential hypertension 03/03/2019   Hypertensive urgency 03/03/2019     Jacqueline Bonilla, PT, DPT 08/03/20 11:49 AM    [place]. [place], [name], [address] Phone: [phone]   Fax:  [phone]  Name: Jacqueline Bonilla MRN: [id number] Date of Birth: Dec 26, 1955

## 2020-08-04 ENCOUNTER — Encounter: Payer: Self-pay | Admitting: Occupational Therapy

## 2020-08-08 ENCOUNTER — Ambulatory Visit (INDEPENDENT_AMBULATORY_CARE_PROVIDER_SITE_OTHER): Payer: Medicare HMO

## 2020-08-08 DIAGNOSIS — I639 Cerebral infarction, unspecified: Secondary | ICD-10-CM

## 2020-08-09 ENCOUNTER — Encounter: Payer: Self-pay | Admitting: Physical Therapy

## 2020-08-09 ENCOUNTER — Encounter: Payer: Self-pay | Admitting: Occupational Therapy

## 2020-08-09 ENCOUNTER — Encounter: Payer: Self-pay | Admitting: Speech Pathology

## 2020-08-09 ENCOUNTER — Ambulatory Visit: Payer: Medicare HMO | Admitting: Occupational Therapy

## 2020-08-09 ENCOUNTER — Other Ambulatory Visit: Payer: Self-pay

## 2020-08-09 ENCOUNTER — Ambulatory Visit: Payer: Medicare HMO | Admitting: Physical Therapy

## 2020-08-09 VITALS — BP 143/77

## 2020-08-09 DIAGNOSIS — R41844 Frontal lobe and executive function deficit: Secondary | ICD-10-CM | POA: Diagnosis not present

## 2020-08-09 DIAGNOSIS — R278 Other lack of coordination: Secondary | ICD-10-CM

## 2020-08-09 DIAGNOSIS — R41842 Visuospatial deficit: Secondary | ICD-10-CM

## 2020-08-09 DIAGNOSIS — R2689 Other abnormalities of gait and mobility: Secondary | ICD-10-CM | POA: Diagnosis not present

## 2020-08-09 DIAGNOSIS — R262 Difficulty in walking, not elsewhere classified: Secondary | ICD-10-CM

## 2020-08-09 DIAGNOSIS — M6281 Muscle weakness (generalized): Secondary | ICD-10-CM

## 2020-08-09 NOTE — Therapy (Signed)
[place]. [place], [name], [address] Phone: [phone]   Fax:  [phone]  Physical Therapy Treatment  Patient Details  Name: Jacqueline Bonilla MRN: [id number] Date of Birth: 23-Feb-1955 Referring Provider (PT): Sagardia   Encounter Date: 08/09/2020   PT End of Session - 08/09/20 1104     Visit Number 4    Date for PT Re-Evaluation 10/12/20    PT Start Time 1018    PT Stop Time 1058    PT Time Calculation (min) 40 min    Equipment Utilized During Treatment Gait belt    Activity Tolerance Patient tolerated treatment well    Behavior During Therapy [facility] for tasks assessed/performed             Past Medical History:  Diagnosis Date   Anemia    Anxiety    Depression    Dysmenorrhea    Endometriosis    Fibroid    Hypertension    Microhematuria    negative workup   Osteoporosis    Tachycardia    Thrombocytopenia ([place])     Past Surgical History:  Procedure Laterality Date   BREAST BIOPSY     CESAREAN SECTION     hysteroscopic resection     implantable loop recorder implant  10/21/2019   Medtronic Reveal Linq model LNQ 22 ([place] [id number]) implantable loop recorder    Vitals:   08/09/20 1019  BP: (!) 143/77     Subjective Assessment - 08/09/20 1019     Subjective Doing well. Continues to go to the [facility] and has done some of the leg machines.    Pertinent History prior CVA affecting R side, HTN    Patient Stated Goals be more mobile, improve balance,  increase independence    Currently in Pain? No/denies                               [facility] Adult PT Treatment/Exercise - 08/09/20 0001       Exercises   Exercises Ankle      Knee/Hip Exercises: Aerobic   Recumbent Bike L2.0 x 6 min      Knee/Hip Exercises: Standing   Terminal Knee Extension Strengthening;Left;1 set;10 reps    Terminal Knee Extension Limitations 10x3" with ball and 1 UE support      Knee/Hip  Exercises: Seated   Sit to Sand without UE support;10 reps;2 sets   L foot back 10x, standing on foam 10x     Knee/Hip Exercises: Supine   Bridges with Ball Squeeze Both;10 reps;1 set    [employer] with Clamshell Both;10 reps;1 set    Other Supine Knee/Hip Exercises dead bug 5x with LEs elevated 90/90, 5x without elevation   heavy verbal and tactile cueing to assist with coordination   Other Supine Knee/Hip Exercises marching bridge 2x10   R hip instability     Ankle Exercises: Seated   Other Seated Ankle Exercises L ankle dorsiflexion with red TB x15                      PT Short Term Goals - 07/27/20 1126       PT SHORT TERM GOAL #1   Title Pt will be I with initial HEP    Time 2    Period Weeks    Status Partially Met    Target Date 08/03/20  PT Long Term Goals - 08/03/20 1147       PT LONG TERM GOAL #1   Title Pt will demo improved Berg Balance score to 52/56 to demo decreased fall risk    Baseline 44/56    Time 6    Period Weeks    Status On-going      PT LONG TERM GOAL #2   Title Pt will demo 5x STS <15 seconds with no UE assist    Baseline 17 seconds with occasional UE assist and LEs intermittently locking on back of chair    Time 6    Period Weeks    Status On-going      PT LONG TERM GOAL #3   Title Pt will demo gait >500 ft over level/unlevel surfaces with no AD and supervision assist    Time 6    Period Weeks    Status On-going      PT LONG TERM GOAL #4   Title Pt will report able to safely return to gym routine at [facility]    Time 6    Period Weeks    Status On-going                   Plan - 08/09/20 1105     Clinical Impression Statement Patient arrived to session without complaints. Systolic BP again appeared slightly elevated today however patient asymptomatic- proceeded with session. Increased challenge with bridges, with patient demonstrating R vs. L instability. Core strengthening integrated a coordination  challenge, which patient found challenging and required heavy verbal and tactile cueing. Reviewed STS transfers with L LE bias with much improved ability and transfer technique. Initiates STS on compliant surface, with patient demonstrated tendency to excessively shift weight posteriorly, but responded well to cues to shift anteriorly to avoid LOB. Limited ROM was demonstrated with ankle strengthening. Patient without complaints at end of session. Patient is demonstrating good progress towards goals in subsequent sessions.    Comorbidities hx of CVA, HTN    PT Treatment/Interventions ADLs/Self Care Home Management;Gait training;Functional mobility training;Therapeutic activities;Therapeutic exercise;Balance training;Neuromuscular re-education;Manual techniques;Patient/family education;Passive range of motion    PT Next Visit Plan check vitals prior to exercise, review/progress HEP, high level balance ex's, gait training over level/unlevel surfaces, functional LE strengthening, help pt return to [facility] routine safely    Consulted and Agree with Plan of Care Patient             Patient will benefit from skilled therapeutic intervention in order to improve the following deficits and impairments:  Abnormal gait, Difficulty walking, Decreased range of motion, Impaired tone, Decreased endurance, Decreased safety awareness, Decreased activity tolerance, Decreased balance, Decreased mobility, Decreased cognition, Decreased strength  Visit Diagnosis: Balance problem  Difficulty in walking, not elsewhere classified  Other abnormalities of gait and mobility  Muscle weakness (generalized)     Problem List Patient Active Problem List   Diagnosis Date Noted   History of stroke 07/14/2020   Azotemia 07/11/2020   Anemia 07/11/2020   CVA (cerebral vascular accident) ([place]) 06/28/2020   Cerebral infarction ([place]) 06/25/2020   Acute left-sided weakness 06/24/2020   Vascular dementia with behavior  disturbance ([place]) 10/13/2019   Vascular dementia without behavioral disturbance ([place]) 07/15/2019   Cerebrovascular accident ([place]) 06/25/2019   Brain fog 04/22/2019   History of COVID-19 04/22/2019   Essential hypertension 03/03/2019   Hypertensive urgency 03/03/2019     [name], PT, DPT 08/09/20 11:07 AM    [place]  [place]. [place], [name], [address] Phone: [phone]   Fax:  [phone]  Name: Jacqueline Bonilla MRN: [id number] Date of Birth: 1955/12/29

## 2020-08-10 NOTE — Therapy (Signed)
[address] [place], [name], [address] Phone: [phone]   Fax:  [phone]  Occupational Therapy Treatment  Patient Details  Name: Jacqueline Bonilla MRN: [id number] Date of Birth: [birth year] Referring Provider (OT): Lauraine Rinne, PA-C   Encounter Date: 08/09/2020   OT End of Session - 08/09/20 1102     Visit Number 4    Number of Visits 9    Date for OT Re-Evaluation 10/05/20    Authorization Type [employer]    Authorization - Visit Number 4    Authorization - Number of Visits 9   by 09/16/09   OT Start Time 1101    OT Stop Time 1143    OT Time Calculation (min) 42 min    Activity Tolerance Patient tolerated treatment well    Behavior During Therapy [facility] for tasks assessed/performed             Past Medical History:  Diagnosis Date   Anemia    Anxiety    Depression    Dysmenorrhea    Endometriosis    Fibroid    Hypertension    Microhematuria    negative workup   Osteoporosis    Tachycardia    Thrombocytopenia ([place])     Past Surgical History:  Procedure Laterality Date   BREAST BIOPSY     CESAREAN SECTION     hysteroscopic resection     implantable loop recorder implant  10/21/2019   Medtronic Reveal Linq model LNQ 22 ([place] [id number]) implantable loop recorder    There were no vitals filed for this visit.   Subjective Assessment - 08/09/20 1101     Subjective  "I see what I want to do, but can't make my hand do it"    Pertinent History "Bilateral scattered infarcts" (per Dr. Erlinda Hong progress note on 06/25/20); PMHx includes history of CVA w/ residual RUE deficits, vascular dementia w/ behavior disturbance, HTN, thrombocytopenia, and ILR    Patient Stated Goals Get strength back in (R) hand; "write better"    Currently in Pain? No/denies             Treatment/Exercises - 08/09/20    Handwriting Distal finger control sheet used to facilitate control, fluency, and smoothness of  handwriting. Pt completed only medium-sized targets w/ built-up handle; multiple line deviations  Crossword puzzle used to facilitate alignment, sizing, and control of letter formation during handwriting. Due to difficulty w/ completing letters in appropriate boxes, OT incorporated high contrast (red lines), which improved pt success    Visual Scanning Tabletop symbol cancellation activity w/ pt identifying 24/24 symbols w/ Mod I (extended time for processing)     Visual Perception Tracing simple shape outlines w/ standard marker to facilitate increased speed and coordination during handwriting tasks; pt completed activity w/ multiple deviations and demo'd decreased smoothness of lines    PVC Pipe Tree Constructional activity w/ PVC pipe tree to facilitate bilateral integration, alternating attention, visual perception/memory, and problem solving; pt able to complete activity w/ Min A/verbal cues for spatial orientation and piece recognition. OT also provided breakdown of task and extended time for processing            OT Education - 08/09/20 1302     Education Details Compensatory strategies/AE for handwriting; 1 red and 2 tan foam handles administered to pt    Person(s) Educated Patient    Methods Explanation;Demonstration;Handout    Comprehension Verbalized understanding;Returned demonstration  OT Short Term Goals - 07/20/20 0810       OT SHORT TERM GOAL #1   Title Pt will improve bilateral [place] for functional tasks as evidenced by decreasing 9-HPT time by at least 4 sec w/ each hand    Baseline R hand 50 sec; L hand 39 sec    Time 4    Period Weeks    Status On-going    Target Date 08/12/20      OT SHORT TERM GOAL #2   Title Pt w/ demo independence w/ compensatory strategies or AE prn to improve participation in handwriting    Baseline Reports difficulty w/ handwriting; decreased speed and legibility    Time 4    Period Weeks    Status On-going              OT Long Term Goals - 07/20/20 0810       OT LONG TERM GOAL #1   Title Pt will be independent w/ full HEP designed for BUE strengthening, GM/FMC, and coordination by discharge    Baseline No HEP at this time    Time 8    Period Weeks    Status On-going      OT LONG TERM GOAL #2   Title Pt will improve GM control and coordination for improved participation in ADLs as evidenced by increasing Box and Blocks score by at least 5 blocks w/ both UEs    Baseline R hand 24 blocks; L hand 30 blocks    Time 8    Period Weeks    Status On-going      OT LONG TERM GOAL #3   Title Pt will demonstrate improved visual perceptual skills as evidenced by decreasing Trail Making Test: Part A time by at least 10 seconds    Baseline TMT: Part A 1 min, 58 sec w/ 1 error    Time 8    Period Weeks    Status On-going             Plan - 08/09/20 1254     Clinical Impression Statement OT facilitated handwriting practice this session to identify appropriate conpensatory/adaptive strategies; pt demo'd increased success w/ built-up handle. Due to difficulty observed w/ targeting during handwriting practice, OT facilitated visual scanning and pattern recognition, which pt did well with. OT discussed visual perceptual hierarchy and incorporarted high contrast, which improved success; will continue to assess in functional context.    OT Occupational Profile and History Detailed Assessment- Review of Records and additional review of physical, cognitive, psychosocial history related to current functional performance    Occupational performance deficits (Please refer to evaluation for details): ADL's;IADL's;Leisure;Social Participation    Body Structure / Function / Physical Skills ADL;Decreased knowledge of use of DME;Strength;Balance;Dexterity;GMC;Tone;UE functional use;Body mechanics;Endurance;IADL;ROM;Vision;Coordination;Mobility;FMC    Cognitive Skills Memory;Perception;Problem Solve;Safety  Awareness;Sequencing    Psychosocial Skills Environmental  Adaptations    Rehab Potential Fair    Clinical Decision Making Several treatment options, min-mod task modification necessary    Comorbidities Affecting Occupational Performance: Presence of comorbidities impacting occupational performance    Comorbidities impacting occupational performance description: Prior CVA affecting RUE; vascular dementia with behavior disturbance    Modification or Assistance to Complete Evaluation  No modification of tasks or assist necessary to complete eval    OT Frequency 1x / week    OT Duration 8 weeks    OT Treatment/Interventions Self-care/ADL training;DME and/or AE instruction;Balance training;Aquatic Therapy;Therapeutic activities;Therapeutic exercise;Cognitive remediation/compensation;Neuromuscular education;Functional Mobility Training;Visual/perceptual remediation/compensation;Energy conservation;Manual Therapy;Patient/family education  Plan Review handwriting compensatory strategies; re-assess 9HPT; continue w/ [place] (tossing large ball; walking and catching a scarf; balloon tennis)    Consulted and Agree with Plan of Care Patient             Patient will benefit from skilled therapeutic intervention in order to improve the following deficits and impairments:   Body Structure / Function / Physical Skills: ADL, Decreased knowledge of use of DME, Strength, Balance, Dexterity, GMC, Tone, UE functional use, Body mechanics, Endurance, IADL, ROM, Vision, Coordination, Mobility, [facility] Cognitive Skills: Memory, Perception, Problem Solve, Safety Awareness, Sequencing Psychosocial Skills: Environmental  Adaptations   Visit Diagnosis: Other lack of coordination  Visuospatial deficit  Frontal lobe and executive function deficit    Problem List Patient Active Problem List   Diagnosis Date Noted   History of stroke 07/14/2020   Azotemia 07/11/2020   Anemia 07/11/2020   CVA (cerebral vascular  accident) ([place]) 06/28/2020   Cerebral infarction ([place]) 06/25/2020   Acute left-sided weakness 06/24/2020   Vascular dementia with behavior disturbance ([place]) 10/13/2019   Vascular dementia without behavioral disturbance ([place]) 07/15/2019   Cerebrovascular accident ([place]) 06/25/2019   Brain fog 04/22/2019   History of COVID-19 04/22/2019   Essential hypertension 03/03/2019   Hypertensive urgency 03/03/2019     Kathrine Cords, OTR/L, MSOT  08/10/2020, 1:07 PM  [address] [place], [name], [address] Phone: [phone]   Fax:  [phone]  Name: Jacqueline Bonilla MRN: [id number] Date of Birth: [birth year]

## 2020-08-12 ENCOUNTER — Other Ambulatory Visit: Payer: Self-pay | Admitting: Physical Medicine and Rehabilitation

## 2020-08-12 ENCOUNTER — Telehealth: Payer: Self-pay | Admitting: Physical Medicine & Rehabilitation

## 2020-08-12 NOTE — Telephone Encounter (Signed)
Notified Jacqueline Bonilla to notify the primary care.  He said he did and they said to call us because our name is on the bottle.  I explained to him about PCP manages these medications.

## 2020-08-12 NOTE — Telephone Encounter (Signed)
[name] (pt's husband) called to let us know that patient has stopped Lipitor due to it making her stomach upset.  Patient is still taking Plavix and aspirin.  Any questions please call him at [phone].

## 2020-08-15 ENCOUNTER — Encounter: Payer: Self-pay | Admitting: Emergency Medicine

## 2020-08-15 ENCOUNTER — Other Ambulatory Visit: Payer: Self-pay | Admitting: Emergency Medicine

## 2020-08-15 LAB — CUP PACEART REMOTE DEVICE CHECK
Date Time Interrogation Session: 20220813190523
Implantable Pulse Generator Implant Date: 20211020

## 2020-08-17 ENCOUNTER — Encounter: Payer: Self-pay | Admitting: Occupational Therapy

## 2020-08-17 ENCOUNTER — Other Ambulatory Visit: Payer: Self-pay

## 2020-08-17 ENCOUNTER — Ambulatory Visit: Payer: Medicare HMO | Admitting: Occupational Therapy

## 2020-08-17 ENCOUNTER — Encounter: Payer: Self-pay | Admitting: Physical Therapy

## 2020-08-17 ENCOUNTER — Ambulatory Visit: Payer: Medicare HMO | Admitting: Physical Therapy

## 2020-08-17 VITALS — BP 138/80

## 2020-08-17 DIAGNOSIS — R262 Difficulty in walking, not elsewhere classified: Secondary | ICD-10-CM

## 2020-08-17 DIAGNOSIS — R2689 Other abnormalities of gait and mobility: Secondary | ICD-10-CM | POA: Diagnosis not present

## 2020-08-17 DIAGNOSIS — M6281 Muscle weakness (generalized): Secondary | ICD-10-CM

## 2020-08-17 DIAGNOSIS — R278 Other lack of coordination: Secondary | ICD-10-CM | POA: Diagnosis not present

## 2020-08-17 DIAGNOSIS — R41842 Visuospatial deficit: Secondary | ICD-10-CM | POA: Diagnosis not present

## 2020-08-17 DIAGNOSIS — R41844 Frontal lobe and executive function deficit: Secondary | ICD-10-CM | POA: Diagnosis not present

## 2020-08-17 NOTE — Therapy (Signed)
[address] [place], [name], [address] Phone: [phone]   Fax:  [phone]  Physical Therapy Treatment  Patient Details  Name: Jacqueline Bonilla MRN: [id number] Date of Birth: 1955-09-25 Referring Provider (PT): Sagardia   Encounter Date: 08/17/2020   PT End of Session - 08/17/20 1147     Visit Number 5    Date for PT Re-Evaluation 10/12/20    PT Start Time 1100    PT Stop Time 1143    PT Time Calculation (min) 43 min    Equipment Utilized During Treatment Gait belt    Activity Tolerance Patient tolerated treatment well    Behavior During Therapy [facility] for tasks assessed/performed             Past Medical History:  Diagnosis Date   Anemia    Anxiety    Depression    Dysmenorrhea    Endometriosis    Fibroid    Hypertension    Microhematuria    negative workup   Osteoporosis    Tachycardia    Thrombocytopenia ([place])     Past Surgical History:  Procedure Laterality Date   BREAST BIOPSY     CESAREAN SECTION     hysteroscopic resection     implantable loop recorder implant  10/21/2019   Medtronic Reveal Linq model LNQ 22 ([place] [id number]) implantable loop recorder    Vitals:   08/17/20 1103  BP: 138/80     Subjective Assessment - 08/17/20 1105     Subjective No issues since last session. Denies falls or trips recently.    Pertinent History prior CVA affecting R side, HTN    Patient Stated Goals be more mobile, improve balance,  increase independence    Currently in Pain? No/denies                               [facility] Adult PT Treatment/Exercise - 08/17/20 0001       Neuro Re-ed    Neuro Re-ed Details  staggered stance R/L + torso twist 5x each; 3 rounds forward tandem walk + normal walk backwards 104f each with CGA; R/ L posterior step over oranged cones + pick up 2x10; fwd/back rocks on rockerboard x10      Knee/Hip Exercises: Aerobic   Recumbent Bike L2.0 x 6  min      Knee/Hip Exercises: Standing   Step Down Left;1 set;10 reps;Hand Hold: 1;Hand Hold: 0;Step Height: 4"    Step Down Limitations L step up/over   cueing to remind pt which foot to step up with     Knee/Hip Exercises: Seated   Sit to [place] 2 sets;10 reps;without UE support   standing on foam; 2nd set with red TB above knees                   PT Education - 08/17/20 1147     Education Details update to HEP- to be performed with husband's supervision for safety    Person(s) Educated Patient    Methods Explanation;Demonstration;Tactile cues;Verbal cues;Handout    Comprehension Verbalized understanding;Returned demonstration              PT Short Term Goals - 07/27/20 1126       PT SHORT TERM GOAL #1   Title Pt will be I with initial HEP    Time 2    Period Weeks    Status Partially  Met    Target Date 08/03/20               PT Long Term Goals - 08/03/20 1147       PT LONG TERM GOAL #1   Title Pt will demo improved Berg Balance score to 52/56 to demo decreased fall risk    Baseline 44/56    Time 6    Period Weeks    Status On-going      PT LONG TERM GOAL #2   Title Pt will demo 5x STS <15 seconds with no UE assist    Baseline 17 seconds with occasional UE assist and LEs intermittently locking on back of chair    Time 6    Period Weeks    Status On-going      PT LONG TERM GOAL #3   Title Pt will demo gait >500 ft over level/unlevel surfaces with no AD and supervision assist    Time 6    Period Weeks    Status On-going      PT LONG TERM GOAL #4   Title Pt will report able to safely return to gym routine at [facility]    Time 6    Period Weeks    Status On-going                   Plan - 08/17/20 1148     Clinical Impression Statement Patient without complaints today. Continues to demonstrate improvement in balance with STS transfers on compliant surface. Today added additional challenge to assist hips in abduction during these  transfers with good effort. Worked on dynamic balance challenges with patient demonstrating most difficulty with narrow BOS. Tandem walk was improved with cue to "physically hit the heel to the toe" providing patient with more proprioceptive input. Cueing still required throughout session to increase B step length d/t tendency to perform small shuffling and ataxic steps. Updated HEP with exercise that was performed safely- patient reported understanding and without complaints at end of session.    Comorbidities hx of CVA, HTN    PT Treatment/Interventions ADLs/Self Care Home Management;Gait training;Functional mobility training;Therapeutic activities;Therapeutic exercise;Balance training;Neuromuscular re-education;Manual techniques;Patient/family education;Passive range of motion    PT Next Visit Plan check vitals prior to exercise, review/progress HEP, high level balance ex's, gait training over level/unlevel surfaces, functional LE strengthening, help pt return to [facility] routine safely    Consulted and Agree with Plan of Care Patient             Patient will benefit from skilled therapeutic intervention in order to improve the following deficits and impairments:  Abnormal gait, Difficulty walking, Decreased range of motion, Impaired tone, Decreased endurance, Decreased safety awareness, Decreased activity tolerance, Decreased balance, Decreased mobility, Decreased cognition, Decreased strength  Visit Diagnosis: Balance problem  Difficulty in walking, not elsewhere classified  Other abnormalities of gait and mobility  Muscle weakness (generalized)     Problem List Patient Active Problem List   Diagnosis Date Noted   History of stroke 07/14/2020   Azotemia 07/11/2020   Anemia 07/11/2020   CVA (cerebral vascular accident) ([place]) 06/28/2020   Cerebral infarction ([place]) 06/25/2020   Acute left-sided weakness 06/24/2020   Vascular dementia with behavior disturbance ([place]) 10/13/2019    Vascular dementia without behavioral disturbance ([place]) 07/15/2019   Cerebrovascular accident ([place]) 06/25/2019   Brain fog 04/22/2019   History of COVID-19 04/22/2019   Essential hypertension 03/03/2019   Hypertensive urgency 03/03/2019     [name], PT,  DPT 08/17/20 12:00 PM    [address]. [place], [name], [address] Phone: [phone]   Fax:  [phone]  Name: SHARONE PICCHI MRN: [id number] Date of Birth: 1955/02/19

## 2020-08-18 NOTE — Therapy (Signed)
[address] [place], [name], [address] Phone: [phone]   Fax:  [phone]  Occupational Therapy Treatment  Patient Details  Name: Jacqueline Bonilla MRN: [id number] Date of Birth: 1955/04/05 Referring Provider (OT): Lauraine Rinne, PA-C   Encounter Date: 08/17/2020   OT End of Session - 08/17/20 1024     Visit Number 5    Number of Visits 9    Date for OT Re-Evaluation 10/05/20    Authorization Type [employer]    Authorization - Visit Number 5    Authorization - Number of Visits 9   by 09/16/09   OT Start Time 1021   pt arrival time   OT Stop Time 1100    OT Time Calculation (min) 39 min    Activity Tolerance Patient tolerated treatment well    Behavior During Therapy [facility] for tasks assessed/performed             Past Medical History:  Diagnosis Date   Anemia    Anxiety    Depression    Dysmenorrhea    Endometriosis    Fibroid    Hypertension    Microhematuria    negative workup   Osteoporosis    Tachycardia    Thrombocytopenia ([place])     Past Surgical History:  Procedure Laterality Date   BREAST BIOPSY     CESAREAN SECTION     hysteroscopic resection     implantable loop recorder implant  10/21/2019   Medtronic Reveal Linq model LNQ 22 ([place] [id number]) implantable loop recorder    There were no vitals filed for this visit.   Subjective Assessment - 08/17/20 1023     Subjective  "Is that (visual perceptual deficits) something that ever gets better or will it linger?"    Pertinent History "Bilateral scattered infarcts" (per Dr. Erlinda Hong progress note on 06/25/20); PMHx includes history of CVA w/ residual RUE deficits, vascular dementia w/ behavior disturbance, HTN, thrombocytopenia, and ILR    Patient Stated Goals Get strength back in (R) hand; "write better"    Currently in Pain? No/denies             Treatment/Exercises - 08/17/20    Puzzle Activity used to facilitate [facility],  bilateral integration, problem-solving, and visual perception/memory/cognition. Pt able to complete 48-piece puzzle interior w/ mod verbal cues/breakdown of task, also requiring extended time for processing/self-correction; OT incorporated scaffolding by completing puzzle border and occasional downgrading during task to facilitate success. Pt experienced difficulty matching and positioning pieces by color/design, orienting pieces, and understanding how pieces fit together. Pt also encouraged to incorporate RUE and demo'd good bilateral coordination.            OT Education - 08/17/20 1046     Education Details Continued condition-specific education; reviewed compensatory strategies for visual perceptual deficits (e.g., high contrast, using a line guide, practice)    Person(s) Educated Patient    Methods Explanation;Demonstration;Handout;Verbal cues    Comprehension Verbalized understanding;Returned demonstration;Verbal cues required             OT Short Term Goals - 08/17/20 1118       OT SHORT TERM GOAL #1   Title Pt will improve bilateral [place] for functional tasks as evidenced by decreasing 9-HPT time by at least 4 sec w/ each hand    Baseline R hand 50 sec; L hand 39 sec    Time 4    Period Weeks    Status On-going  Target Date 08/12/20      OT SHORT TERM GOAL #2   Title Pt w/ demo independence w/ compensatory strategies or AE prn to improve participation in handwriting    Baseline Reports difficulty w/ handwriting; decreased speed and legibility    Time 4    Period Weeks    Status Achieved   08/17/20 - demo's Ind and reports carryover of learned strategies to home            OT Long Term Goals - 07/20/20 0810       OT LONG TERM GOAL #1   Title Pt will be independent w/ full HEP designed for BUE strengthening, GM/FMC, and coordination by discharge    Baseline No HEP at this time    Time 8    Period Weeks    Status On-going      OT LONG TERM GOAL #2   Title Pt  will improve GM control and coordination for improved participation in ADLs as evidenced by increasing Box and Blocks score by at least 5 blocks w/ both UEs    Baseline R hand 24 blocks; L hand 30 blocks    Time 8    Period Weeks    Status On-going      OT LONG TERM GOAL #3   Title Pt will demonstrate improved visual perceptual skills as evidenced by decreasing Trail Making Test: Part A time by at least 10 seconds    Baseline TMT: Part A 1 min, 58 sec w/ 1 error    Time 8    Period Weeks    Status On-going             Plan - 08/17/20 1050     Clinical Impression Statement Pt is progressing slowly toward goals and reports good carryover of learned strategies to home. OT continued to address visual-perceptual deficits this session w/ pt completing a puzzle requiring scaffolding, verbal cues, extended time for processing, and grading for success. Difficulty w/ puzzle activity indicates limitations w/ visual closure, visual memory, and spatial relations.    OT Occupational Profile and History Detailed Assessment- Review of Records and additional review of physical, cognitive, psychosocial history related to current functional performance    Occupational performance deficits (Please refer to evaluation for details): ADL's;IADL's;Leisure;Social Participation    Body Structure / Function / Physical Skills ADL;Decreased knowledge of use of DME;Strength;Balance;Dexterity;GMC;Tone;UE functional use;Body mechanics;Endurance;IADL;ROM;Vision;Coordination;Mobility;FMC    Cognitive Skills Memory;Perception;Problem Solve;Safety Awareness;Sequencing    Psychosocial Skills Environmental  Adaptations    Rehab Potential Fair    Clinical Decision Making Several treatment options, min-mod task modification necessary    Comorbidities Affecting Occupational Performance: Presence of comorbidities impacting occupational performance    Comorbidities impacting occupational performance description: Prior CVA  affecting RUE; vascular dementia with behavior disturbance    Modification or Assistance to Complete Evaluation  No modification of tasks or assist necessary to complete eval    OT Frequency 1x / week    OT Duration 8 weeks    OT Treatment/Interventions Self-care/ADL training;DME and/or AE instruction;Balance training;Aquatic Therapy;Therapeutic activities;Therapeutic exercise;Cognitive remediation/compensation;Neuromuscular education;Functional Mobility Training;Visual/perceptual remediation/compensation;Energy conservation;Manual Therapy;Patient/family education    Plan Re-assess 9HPT; continue w/ [place] (tossing large ball; walking and catching a scarf; balloon tennis)    Consulted and Agree with Plan of Care Patient             Patient will benefit from skilled therapeutic intervention in order to improve the following deficits and impairments:   Body Structure / Function /  Physical Skills: ADL, Decreased knowledge of use of DME, Strength, Balance, Dexterity, GMC, Tone, UE functional use, Body mechanics, Endurance, IADL, ROM, Vision, Coordination, Mobility, [facility] Cognitive Skills: Memory, Perception, Problem Solve, Safety Awareness, Sequencing Psychosocial Skills: Environmental  Adaptations   Visit Diagnosis: Other lack of coordination  Visuospatial deficit  Frontal lobe and executive function deficit    Problem List Patient Active Problem List   Diagnosis Date Noted   History of stroke 07/14/2020   Azotemia 07/11/2020   Anemia 07/11/2020   CVA (cerebral vascular accident) ([place]) 06/28/2020   Cerebral infarction ([place]) 06/25/2020   Acute left-sided weakness 06/24/2020   Vascular dementia with behavior disturbance ([place]) 10/13/2019   Vascular dementia without behavioral disturbance ([place]) 07/15/2019   Cerebrovascular accident ([place]) 06/25/2019   Brain fog 04/22/2019   History of COVID-19 04/22/2019   Essential hypertension 03/03/2019   Hypertensive urgency 03/03/2019      Kathrine Cords, OTR/L, MSOT  08/17/2020, 11:20 AM  [address]. [place], [name], [address] Phone: [phone]   Fax:  [phone]  Name: ANTONEA LANGMAN MRN: [id number] Date of Birth: 18-Jul-1955

## 2020-08-24 ENCOUNTER — Ambulatory Visit: Payer: Medicare HMO | Admitting: Physical Therapy

## 2020-08-24 ENCOUNTER — Other Ambulatory Visit: Payer: Self-pay

## 2020-08-24 ENCOUNTER — Encounter: Payer: Self-pay | Admitting: Occupational Therapy

## 2020-08-24 ENCOUNTER — Ambulatory Visit: Payer: Medicare HMO | Admitting: Occupational Therapy

## 2020-08-24 ENCOUNTER — Encounter: Payer: Self-pay | Admitting: Physical Therapy

## 2020-08-24 VITALS — BP 132/80 | HR 75

## 2020-08-24 DIAGNOSIS — M6281 Muscle weakness (generalized): Secondary | ICD-10-CM

## 2020-08-24 DIAGNOSIS — R2689 Other abnormalities of gait and mobility: Secondary | ICD-10-CM

## 2020-08-24 DIAGNOSIS — R41844 Frontal lobe and executive function deficit: Secondary | ICD-10-CM

## 2020-08-24 DIAGNOSIS — R278 Other lack of coordination: Secondary | ICD-10-CM

## 2020-08-24 DIAGNOSIS — R41842 Visuospatial deficit: Secondary | ICD-10-CM | POA: Diagnosis not present

## 2020-08-24 DIAGNOSIS — R262 Difficulty in walking, not elsewhere classified: Secondary | ICD-10-CM | POA: Diagnosis not present

## 2020-08-24 NOTE — Therapy (Signed)
[place] [place], [name], [address] Phone: [phone]   Fax:  [phone]  Physical Therapy Treatment  Patient Details  Name: Jacqueline Bonilla MRN: [id number] Date of Birth: [birth year] Referring Provider (PT): Sagardia   Encounter Date: 08/24/2020   PT End of Session - 08/24/20 1144     Visit Number 6    Date for PT Re-Evaluation 10/12/20    PT Start Time 1103    PT Stop Time 1143    PT Time Calculation (min) 40 min    Equipment Utilized During Treatment Gait belt    Activity Tolerance Patient tolerated treatment well    Behavior During Therapy [facility] for tasks assessed/performed             Past Medical History:  Diagnosis Date   Anemia    Anxiety    Depression    Dysmenorrhea    Endometriosis    Fibroid    Hypertension    Microhematuria    negative workup   Osteoporosis    Tachycardia    Thrombocytopenia ([place])     Past Surgical History:  Procedure Laterality Date   BREAST BIOPSY     CESAREAN SECTION     hysteroscopic resection     implantable loop recorder implant  10/21/2019   Medtronic Reveal Linq model LNQ 22 ([place] [id number]) implantable loop recorder    Vitals:   08/24/20 1106  BP: 132/80  Pulse: 75  SpO2: 94%     Subjective Assessment - 08/24/20 1102     Subjective Has had episodes of dizziness this week- plans to talk to her MD about this tomorrow. Notes that she went a weekend without Plavix and wondering if it is related. Notes that the episodes are spontaneous and not related to movement. 0/10 dizziness currently.    Pertinent History prior CVA affecting R side, HTN    Patient Stated Goals be more mobile, improve balance,  increase independence    Currently in Pain? No/denies                               [facility] Adult PT Treatment/Exercise - 08/24/20 0001       Neuro Re-ed    Neuro Re-ed Details  backwards walking holding cable at 25# followed  by slow stepping forward 10x CGA; 1/2 kneeling row 10x 20#; tall kneeling sidesteps 2 lengths of mat, 2 additional with yellow TB with CGA; standing on airex with wide/narrow stance passing 5# behind back 2x10      Knee/Hip Exercises: Aerobic   Recumbent Bike L1.5 x 6 min                      PT Short Term Goals - 08/24/20 1147       PT SHORT TERM GOAL #1   Title Pt will be I with initial HEP    Time 2    Period Weeks    Status Achieved    Target Date 08/03/20               PT Long Term Goals - 08/03/20 1147       PT LONG TERM GOAL #1   Title Pt will demo improved Berg Balance score to 52/56 to demo decreased fall risk    Baseline 44/56    Time 6    Period Weeks    Status On-going  PT LONG TERM GOAL #2   Title Pt will demo 5x STS <15 seconds with no UE assist    Baseline 17 seconds with occasional UE assist and LEs intermittently locking on back of chair    Time 6    Period Weeks    Status On-going      PT LONG TERM GOAL #3   Title Pt will demo gait >500 ft over level/unlevel surfaces with no AD and supervision assist    Time 6    Period Weeks    Status On-going      PT LONG TERM GOAL #4   Title Pt will report able to safely return to gym routine at [facility]    Time 6    Period Weeks    Status On-going                   Plan - 08/24/20 1145     Clinical Impression Statement Patient reporting new onset of dizziness this week without known cause. Notes that she has an appointment with her PCP tomorrow and plans to discuss this. Dizziness seems to be spontaneous and not related to movement. Vitals appeared normal at start of session and patient asymptomatic. Worked on  and tall kneeling exercise to challenge balance, core, and hip strength. Patient with tendency to demonstrate L lateral lean in  kneeling and demonstrated more weakness in L hip in tall kneeling. Had difficulty transitioning between the positions, which may require more work.  Dynamic balance exercises were progressed today with good tolerance and ability to correct balance. Had one episode of posterior LOB requiring patient to sit back on the seat behind her. Patient without complaints at end of session.    Comorbidities hx of CVA, HTN    PT Treatment/Interventions ADLs/Self Care Home Management;Gait training;Functional mobility training;Therapeutic activities;Therapeutic exercise;Balance training;Neuromuscular re-education;Manual techniques;Patient/family education;Passive range of motion    PT Next Visit Plan check vitals prior to exercise, review/progress HEP, high level balance ex's, gait training over level/unlevel surfaces, functional LE strengthening, help pt return to [facility] routine safely    Consulted and Agree with Plan of Care Patient             Patient will benefit from skilled therapeutic intervention in order to improve the following deficits and impairments:  Abnormal gait, Difficulty walking, Decreased range of motion, Impaired tone, Decreased endurance, Decreased safety awareness, Decreased activity tolerance, Decreased balance, Decreased mobility, Decreased cognition, Decreased strength  Visit Diagnosis: Balance problem  Difficulty in walking, not elsewhere classified  Other abnormalities of gait and mobility  Muscle weakness (generalized)  Other lack of coordination     Problem List Patient Active Problem List   Diagnosis Date Noted   History of stroke 07/14/2020   Azotemia 07/11/2020   Anemia 07/11/2020   CVA (cerebral vascular accident) ([place]) 06/28/2020   Cerebral infarction ([place]) 06/25/2020   Acute left-sided weakness 06/24/2020   Vascular dementia with behavior disturbance ([place]) 10/13/2019   Vascular dementia without behavioral disturbance ([place]) 07/15/2019   Cerebrovascular accident ([place]) 06/25/2019   Brain fog 04/22/2019   History of COVID-19 04/22/2019   Essential hypertension 03/03/2019   Hypertensive urgency 03/03/2019     Jacqueline Bonilla, PT, DPT 08/24/20 11:48 AM   [place]. [place], [name], [address] Phone: [phone]   Fax:  [phone]  Name: Jacqueline Bonilla MRN: [id number] Date of Birth: 06/24/1955

## 2020-08-25 ENCOUNTER — Ambulatory Visit (INDEPENDENT_AMBULATORY_CARE_PROVIDER_SITE_OTHER): Payer: Medicare HMO

## 2020-08-25 ENCOUNTER — Encounter: Payer: Self-pay | Admitting: Emergency Medicine

## 2020-08-25 ENCOUNTER — Ambulatory Visit (INDEPENDENT_AMBULATORY_CARE_PROVIDER_SITE_OTHER): Payer: Medicare HMO | Admitting: Emergency Medicine

## 2020-08-25 VITALS — BP 118/70 | HR 82 | Temp 98.7°F | Ht 64.0 in | Wt 114.0 lb

## 2020-08-25 VITALS — BP 118/70 | HR 82 | Temp 98.7°F | Ht 64.0 in | Wt 114.2 lb

## 2020-08-25 DIAGNOSIS — I10 Essential (primary) hypertension: Secondary | ICD-10-CM | POA: Diagnosis not present

## 2020-08-25 DIAGNOSIS — Z0000 Encounter for general adult medical examination without abnormal findings: Secondary | ICD-10-CM | POA: Diagnosis not present

## 2020-08-25 DIAGNOSIS — R42 Dizziness and giddiness: Secondary | ICD-10-CM

## 2020-08-25 DIAGNOSIS — Z8673 Personal history of transient ischemic attack (TIA), and cerebral infarction without residual deficits: Secondary | ICD-10-CM

## 2020-08-25 LAB — COMPREHENSIVE METABOLIC PANEL
ALT: 22 U/L (ref 0–35)
AST: 24 U/L (ref 0–37)
Albumin: 4.2 g/dL (ref 3.5–5.2)
Alkaline Phosphatase: 58 U/L (ref 39–117)
BUN: 27 mg/dL — ABNORMAL HIGH (ref 6–23)
CO2: 28 mEq/L (ref 19–32)
Calcium: 10.1 mg/dL (ref 8.4–10.5)
Chloride: 102 mEq/L (ref 96–112)
Creatinine, Ser: 1.01 mg/dL (ref 0.40–1.20)
GFR: 57.71 mL/min — ABNORMAL LOW (ref >60.00)
Glucose, Bld: 102 mg/dL — ABNORMAL HIGH (ref 70–99)
Potassium: 4.1 mEq/L (ref 3.5–5.1)
Sodium: 138 mEq/L (ref 135–145)
Total Bilirubin: 0.4 mg/dL (ref 0.2–1.2)
Total Protein: 7.8 g/dL (ref 6.0–8.3)

## 2020-08-25 LAB — CBC WITH DIFFERENTIAL/PLATELET
Basophils Absolute: 0 10*3/uL (ref 0.0–0.1)
Basophils Relative: 0.9 % (ref 0.0–3.0)
Eosinophils Absolute: 0.1 10*3/uL (ref 0.0–0.7)
Eosinophils Relative: 1.2 % (ref 0.0–5.0)
HCT: 38.9 % (ref 36.0–46.0)
Hemoglobin: 12.7 g/dL (ref 12.0–15.0)
Lymphocytes Relative: 20.2 % (ref 12.0–46.0)
Lymphs Abs: 1.1 10*3/uL (ref 0.7–4.0)
MCHC: 32.6 g/dL (ref 30.0–36.0)
MCV: 91.3 fl (ref 78.0–100.0)
Monocytes Absolute: 0.5 10*3/uL (ref 0.1–1.0)
Monocytes Relative: 9.1 % (ref 3.0–12.0)
Neutro Abs: 3.9 10*3/uL (ref 1.4–7.7)
Neutrophils Relative %: 68.6 % (ref 43.0–77.0)
Platelets: 211 10*3/uL (ref 150.0–400.0)
RBC: 4.26 Mil/uL (ref 3.87–5.11)
RDW: 13.8 % (ref 11.5–15.5)
WBC: 5.7 10*3/uL (ref 4.0–10.5)

## 2020-08-25 LAB — HEMOGLOBIN A1C: Hgb A1c MFr Bld: 6 % (ref 4.6–6.5)

## 2020-08-25 LAB — TSH: TSH: 1.04 u[IU]/mL (ref 0.35–5.50)

## 2020-08-25 NOTE — Progress Notes (Signed)
BP Readings from Last 3 Encounters:  08/24/20 132/80  08/17/20 138/80  08/09/20 (!) 143/77   Wt Readings from Last 3 Encounters:  07/20/20 117 lb (53.1 kg)  07/14/20 114 lb (51.7 kg)  06/24/20 115 lb 8.3 oz (52.4 kg)   Jacqueline Bonilla 65 y.o.   Chief Complaint  Patient presents with   Follow-up    Dizziness for 3  days    HISTORY OF PRESENT ILLNESS: This is a 65 y.o. female here for office visit follow-up 4 weeks ago, status post stroke 06/28/2020. Has been doing well.  Slowly progressing.  Dizzy for the past 3 days.  Was off Plavix for a couple of days and once it was reintroduced she started feeling dizzy.  She feels her dizziness is due to Plavix and is reluctant to go back on it. Presently taking daily aspirin and losartan for hypertension. Atorvastatin was giving her diarrhea so she is not taking it on a daily basis. Has appointment to see her neurologist next week. Otherwise doing well and going through physical therapy with success.  HPI   Prior to Admission medications   Medication Sig Start Date End Date Taking? Authorizing Provider  Ascorbic Acid (VITAMIN C PO) Take 1 tablet by mouth daily.   Yes [provider]  aspirin EC 81 MG tablet Take 1 tablet (81 mg total) by mouth daily. Swallow whole. 06/28/20  Yes Kathie Dike, MD  atorvastatin (LIPITOR) 40 MG tablet Take 1 tablet (40 mg total) by mouth daily. Patient not taking: Reported on 08/25/2020 07/07/20  Yes Love, Ivan Anchors, PA-C  Cholecalciferol (VITAMIN D3 GUMMIES PO) Take 1,000 mcg by mouth daily.   Yes [provider]  clopidogrel (PLAVIX) 75 MG tablet TAKE 1 TABLET(75 MG) BY MOUTH DAILY 08/16/20  Yes Everardo Voris, Ines Bloomer, MD  losartan (COZAAR) 25 MG tablet TAKE 1 TABLET(25 MG) BY MOUTH DAILY 07/21/20  Yes Lando Alcalde, Ines Bloomer, MD  Multiple Vitamins-Minerals (ZINC PO) Take 1 tablet by mouth daily.   Yes [provider]  Thiamine HCl (VITAMIN B-1 PO) Take 1 tablet by mouth daily.    Yes [provider]    Allergies  Allergen Reactions   Avelox [Moxifloxacin]    Ciprofloxacin Hives   Floraquin [Iodoquinol]     No cipro or others   Iohexol      Code: HIVES, Desc: HIVES S/P IVP MANY YRS AGO...NO PROBLEM W/O PREP TODAY/A.[place], Onset Date: [id number]    Levaquin [Levofloxacin]    Quinolones    Shellfish Allergy Itching    Itching under the chin. Described by patient but not seen by husband.    Patient Active Problem List   Diagnosis Date Noted   History of stroke 07/14/2020   Azotemia 07/11/2020   Anemia 07/11/2020   CVA (cerebral vascular accident) ([place]) 06/28/2020   Cerebral infarction ([place]) 06/25/2020   Acute left-sided weakness 06/24/2020   Vascular dementia with behavior disturbance ([place]) 10/13/2019   Vascular dementia without behavioral disturbance ([place]) 07/15/2019   Cerebrovascular accident ([place]) 06/25/2019   Brain fog 04/22/2019   History of COVID-19 04/22/2019   Essential hypertension 03/03/2019   Hypertensive urgency 03/03/2019    Past Medical History:  Diagnosis Date   Anemia    Anxiety    Depression    Dysmenorrhea    Endometriosis    Fibroid    Hypertension    Microhematuria    negative workup   Osteoporosis    Tachycardia    Thrombocytopenia ([place])  Past Surgical History:  Procedure Laterality Date   BREAST BIOPSY     CESAREAN SECTION     hysteroscopic resection     implantable loop recorder implant  10/21/2019   Medtronic Reveal Linq model LNQ 22 ([place] [id number]) implantable loop recorder    Social History   Socioeconomic History   Marital status: Married    Spouse name: Not on file   Number of children: 1   Years of education: Not on file   Highest education level: Not on file  Occupational History   Occupation: accounting  Tobacco Use   Smoking status: Never   Smokeless tobacco: Never  Vaping Use   Vaping Use: Never used  Substance and Sexual Activity   Alcohol use: Never   Drug use: Never    Sexual activity: Not Currently    Partners: Male    Comment: husband vasectomy  Other Topics Concern   Not on file  Social History Narrative   Lives with husband   Grandchildren - 1   Works - Investment banker, 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%   Gun in home - yes - secured      Right handed   Caffeine: maybe tea every now and then   Social Determinants of Radio broadcast assistant Strain: Low Risk    Difficulty of Paying Living Expenses: Not hard at all  Food Insecurity: No Food Insecurity   Worried About Charity fundraiser in the Last Year: Never true   Arboriculturist in the Last Year: Never true  Transportation Needs: No Transportation Needs   Lack of Transportation (Medical): No   Lack of Transportation (Non-Medical): No  Physical Activity: Sufficiently Active   Days of Exercise per Week: 5 days   Minutes of Exercise per Session: 30 min  Stress: No Stress Concern Present   Feeling of Stress : Not at all  Social Connections: Not on file  Intimate Partner Violence: Not At Risk   Fear of Current or Ex-Partner: No   Emotionally Abused: No   Physically Abused: No   Sexually Abused: No    Family History  Problem Relation Age of Onset   Cancer Father        non hodgkin lymphoma & skin   Heart attack Maternal Grandfather    Dementia Mother    Polymyositis Sister      Review of Systems  Constitutional: Negative.  Negative for chills and fever.  HENT: Negative.  Negative for congestion and sore throat.   Respiratory: Negative.  Negative for cough and shortness of breath.   Cardiovascular: Negative.  Negative for chest pain and palpitations.  Gastrointestinal: Negative.  Negative for abdominal pain, blood in stool, diarrhea, melena, nausea and vomiting.  Genitourinary: Negative.  Negative for dysuria and hematuria.  Skin: Negative.  Negative for rash.  Neurological:  Positive for dizziness. Negative for speech change, focal weakness, seizures, loss of consciousness and headaches.   All other systems reviewed and are negative.   Physical Exam Vitals reviewed.  Constitutional:      Appearance: Normal appearance.  HENT:     Head: Normocephalic.  Eyes:     Extraocular Movements: Extraocular movements intact.     Pupils: Pupils are equal, round, and reactive to light.  Cardiovascular:     Rate and Rhythm: Normal rate and regular rhythm.     Pulses: Normal pulses.     Heart sounds: Normal heart sounds.  Pulmonary:  Effort: Pulmonary effort is normal.     Breath sounds: Normal breath sounds.  Musculoskeletal:     Cervical back: Normal range of motion and neck supple.     Right lower leg: No edema.     Left lower leg: No edema.  Skin:    General: Skin is warm and dry.     Capillary Refill: Capillary refill takes less than 2 seconds.  Neurological:     General: No focal deficit present.     Mental Status: She is alert and oriented to person, place, and time. Mental status is at baseline.  Psychiatric:        Mood and Affect: Mood normal.        Behavior: Behavior normal.     ASSESSMENT & PLAN: Dizziness Differential diagnosis discussed.  No new neurological findings or indications of a new stroke.  Blood work done today showed mild dehydration.  Medication related is a possibility.  Advised to continue Plavix until follow-up with neurologist next week.  History of stroke Continues to improve.  No new neurological findings identified.  Neurological exam better than 4 weeks ago.  Slowly making progress.  Essential hypertension Well-controlled hypertension.  Continue losartan 25 mg daily. BP Readings from Last 3 Encounters:  08/25/20 118/70  08/25/20 118/70  08/24/20 132/80   Jacqueline Bonilla was seen today for follow-up.  Diagnoses and all orders for this visit:  Dizziness -     CBC with Differential/Platelet -     Comprehensive metabolic panel -     TSH -     Hemoglobin A1c  History of stroke  Essential hypertension  Patient Instructions   Dizziness Dizziness is a common problem. It makes you feel unsteady or light-headed. You may feel like you are about to pass out (faint). Dizziness can lead to getting hurt if you stumble or fall. Dizziness can be caused by many things, including: Medicines. Not having enough water in your body (dehydration). Illness. Follow these instructions at home: Eating and drinking  Drink enough fluid to keep your pee (urine) pale yellow. This helps to keep you from getting dehydrated. Try to drink more clear fluids, such as water. Do not drink alcohol. Limit how much caffeine you drink or eat, if your doctor tells you to do that. Limit how much salt (sodium) you drink or eat, if your doctor tells you to do that.  Activity  Avoid making quick movements. Stand up slowly from sitting in a chair, and steady yourself until you feel okay. In the morning, first sit up on the side of the bed. When you feel okay, stand up slowly while you hold onto something. Do this until you know that your balance is okay. If you need to stand in one place for a long time, move your legs often. Tighten and relax the muscles in your legs while you are standing. Do not drive or use machinery if you feel dizzy. Avoid bending down if you feel dizzy. Place items in your home so you can reach them easily without leaning over.  Lifestyle Do not smoke or use any products that contain nicotine or tobacco. If you need help quitting, ask your doctor. Try to lower your stress level. You can do this by using methods such as yoga or meditation. Talk with your doctor if you need help. General instructions Watch your dizziness for any changes. Take over-the-counter and prescription medicines only as told by your doctor. Talk with your doctor if you think that  you are dizzy because of a medicine that you are taking. Tell a friend or a family member that you are feeling dizzy. If he or she notices any changes in your behavior, have this  person call your doctor. Keep all follow-up visits. Contact a doctor if: Your dizziness does not go away. Your dizziness or light-headedness gets worse. You feel like you may vomit (are nauseous). You have trouble hearing. You have new symptoms. You are unsteady on your feet. You feel like the room is spinning. You have neck pain or a stiff neck. You have a fever. Get help right away if: You vomit or have watery poop (diarrhea), and you cannot eat or drink anything. You have trouble: Talking. Walking. Swallowing. Using your arms, hands, or legs. You feel generally weak. You are not thinking clearly, or you have trouble forming sentences. A friend or family member may notice this. You have: Chest pain. Pain in your belly (abdomen). Shortness of breath. Sweating. Your vision changes. You are bleeding. You have a very bad headache. These symptoms may be an emergency. Get help right away. Call your local emergency services (911 in the [place] Do not wait to see if the symptoms will go away. Do not drive yourself to the hospital. Summary Dizziness makes you feel unsteady or light-headed. You may feel like you are about to pass out (faint). Drink enough fluid to keep your pee (urine) pale yellow. Do not drink alcohol. Avoid making quick movements if you feel dizzy. Watch your dizziness for any changes. This information is not intended to replace advice given to you by your health care provider. Make sure you discuss any questions you have with your healthcare provider. Document Revised: 11/23/2019 Document Reviewed: 11/23/2019 Elsevier Patient Education  2022 [place], [place] [facility]

## 2020-08-25 NOTE — Patient Instructions (Signed)
Dizziness Dizziness is a common problem. It makes you feel unsteady or light-headed. You may feel like you are about to pass out (faint). Dizziness can lead to getting hurt if you stumble or fall. Dizziness can be caused by many things, including: Medicines. Not having enough water in your body (dehydration). Illness. Follow these instructions at home: Eating and drinking  Drink enough fluid to keep your pee (urine) pale yellow. This helps to keep you from getting dehydrated. Try to drink more clear fluids, such as water. Do not drink alcohol. Limit how much caffeine you drink or eat, if your doctor tells you to do that. Limit how much salt (sodium) you drink or eat, if your doctor tells you to do that.  Activity  Avoid making quick movements. Stand up slowly from sitting in a chair, and steady yourself until you feel okay. In the morning, first sit up on the side of the bed. When you feel okay, stand up slowly while you hold onto something. Do this until you know that your balance is okay. If you need to stand in one place for a long time, move your legs often. Tighten and relax the muscles in your legs while you are standing. Do not drive or use machinery if you feel dizzy. Avoid bending down if you feel dizzy. Place items in your home so you can reach them easily without leaning over.  Lifestyle Do not smoke or use any products that contain nicotine or tobacco. If you need help quitting, ask your doctor. Try to lower your stress level. You can do this by using methods such as yoga or meditation. Talk with your doctor if you need help. General instructions Watch your dizziness for any changes. Take over-the-counter and prescription medicines only as told by your doctor. Talk with your doctor if you think that you are dizzy because of a medicine that you are taking. Tell a friend or a family member that you are feeling dizzy. If he or she notices any changes in your behavior, have this  person call your doctor. Keep all follow-up visits. Contact a doctor if: Your dizziness does not go away. Your dizziness or light-headedness gets worse. You feel like you may vomit (are nauseous). You have trouble hearing. You have new symptoms. You are unsteady on your feet. You feel like the room is spinning. You have neck pain or a stiff neck. You have a fever. Get help right away if: You vomit or have watery poop (diarrhea), and you cannot eat or drink anything. You have trouble: Talking. Walking. Swallowing. Using your arms, hands, or legs. You feel generally weak. You are not thinking clearly, or you have trouble forming sentences. A friend or family member may notice this. You have: Chest pain. Pain in your belly (abdomen). Shortness of breath. Sweating. Your vision changes. You are bleeding. You have a very bad headache. These symptoms may be an emergency. Get help right away. Call your local emergency services (911 in the [place] Do not wait to see if the symptoms will go away. Do not drive yourself to the hospital. Summary Dizziness makes you feel unsteady or light-headed. You may feel like you are about to pass out (faint). Drink enough fluid to keep your pee (urine) pale yellow. Do not drink alcohol. Avoid making quick movements if you feel dizzy. Watch your dizziness for any changes. This information is not intended to replace advice given to you by your health care provider. Make sure you discuss   any questions you have with your healthcare provider. Document Revised: 11/23/2019 Document Reviewed: 11/23/2019 Elsevier Patient Education  2022 [employer] Inc[employer]  

## 2020-08-25 NOTE — Therapy (Signed)
[place] [place], [name], [address] Phone: [phone]   Fax:  [phone]  Occupational Therapy Treatment  Patient Details  Name: Jacqueline Bonilla MRN: [id number] Date of Birth: 07/01/1955 Referring Provider (OT): Lauraine Rinne, PA-C   Encounter Date: 08/24/2020   OT End of Session - 08/24/20 1027     Visit Number 6    Number of Visits 9    Date for OT Re-Evaluation 10/05/20    Authorization Type [employer]    Authorization - Visit Number 6    Authorization - Number of Visits 9   by 09/16/09   OT Start Time 1018    OT Stop Time 1058    OT Time Calculation (min) 40 min    Activity Tolerance Patient tolerated treatment well    Behavior During Therapy [facility] for tasks assessed/performed            Past Medical History:  Diagnosis Date   Anemia    Anxiety    Depression    Dysmenorrhea    Endometriosis    Fibroid    Hypertension    Microhematuria    negative workup   Osteoporosis    Tachycardia    Thrombocytopenia ([place])     Past Surgical History:  Procedure Laterality Date   BREAST BIOPSY     CESAREAN SECTION     hysteroscopic resection     implantable loop recorder implant  10/21/2019   Medtronic Reveal Linq model LNQ 22 ([id number] [id number]) implantable loop recorder    There were no vitals filed for this visit.   Subjective Assessment - 08/24/20 1023     Subjective  "I've had 3 days of dizziness;" pt reports she ran out of her Plavix last week, but was able to restart it and think they may be related. Pt denies headaches, blurred vision, add'l weakness, new/worsening confusion and reports having a follow-up appt tomorrow and will bring it up to her physician then.    Pertinent History "Bilateral scattered infarcts" (per Dr. Erlinda Hong progress note on 06/25/20); PMHx includes history of CVA w/ residual RUE deficits, vascular dementia w/ behavior disturbance, HTN, thrombocytopenia, and ILR     Patient Stated Goals Get strength back in (R) hand; "write better"    Currently in Pain? No/denies             Treatment/Exercises - 08/24/20    Angus Stacking blocks into towers of 5 or more w/ 2 drops and decreased insight regarding positioning towers to prevent knocking into previously constructed towers    [employer] game played to facilitate RUE control and coordination, force gradation, and eye-hand coordination. Able to complete w/ min drops and occ required both hands to stabilize blocks when stacking on top of tower; min verbal cues for pacing and force gradation to improve accuracy/success         OT Education - 08/24/20 1026     Education Details Continued condition-specific education and progression of visual perceptual deficits    Person(s) Educated Patient    Methods Explanation    Comprehension Verbalized understanding             OT Short Term Goals - 08/24/20 1043       OT SHORT TERM GOAL #1   Title Pt will improve bilateral [place] for functional tasks as evidenced by decreasing 9-HPT time by at least 4 sec w/ each hand    Baseline R hand 50 sec; L hand  39 sec    Time 4    Period Weeks    Status On-going   08/24/20 - RUE 48 sec; LUE 39 sec   Target Date 08/12/20      OT SHORT TERM GOAL #2   Title Pt w/ demo independence w/ compensatory strategies or AE prn to improve participation in handwriting    Baseline Reports difficulty w/ handwriting; decreased speed and legibility    Time 4    Period Weeks    Status Achieved   08/17/20 - demo's Ind and reports carryover of learned strategies to home            OT Long Term Goals - 08/24/20 1038       OT LONG TERM GOAL #1   Title Pt will be independent w/ full HEP designed for BUE strengthening, GM/FMC, and coordination by discharge    Baseline No HEP at this time    Time 8    Period Weeks    Status On-going    Target Date 09/09/20      OT LONG TERM GOAL #2   Title Pt will improve GM control and  coordination for improved participation in ADLs as evidenced by increasing Box and Blocks score by at least 5 blocks w/ both UEs    Baseline R hand 24 blocks; L hand 30 blocks    Time 8    Period Weeks    Status On-going   08/24/20 - RUE 27 blocks; LUE 32 blocks     OT LONG TERM GOAL #3   Title Pt will demonstrate improved visual perceptual skills as evidenced by decreasing Trail Making Test: Part A time by at least 10 seconds    Baseline TMT: Part A 1 min, 58 sec w/ 1 error    Time 8    Period Weeks    Status On-going             Plan - 08/24/20 1326     Clinical Impression Statement Pt demonstrated mild improvements in unilateral [place] w/ BUEs, continuing to demonstrate difficulty w/ [place]. Pt also continues to benefit from cueing for pacing to increase success during therapeutic activities.    OT Occupational Profile and History Detailed Assessment- Review of Records and additional review of physical, cognitive, psychosocial history related to current functional performance    Occupational performance deficits (Please refer to evaluation for details): ADL's;IADL's;Leisure;Social Participation    Body Structure / Function / Physical Skills ADL;Decreased knowledge of use of DME;Strength;Balance;Dexterity;GMC;Tone;UE functional use;Body mechanics;Endurance;IADL;ROM;Vision;Coordination;Mobility;FMC    Cognitive Skills Memory;Perception;Problem Solve;Safety Awareness;Sequencing    Psychosocial Skills Environmental  Adaptations    Rehab Potential Fair    Clinical Decision Making Several treatment options, min-mod task modification necessary    Comorbidities Affecting Occupational Performance: Presence of comorbidities impacting occupational performance    Comorbidities impacting occupational performance description: Prior CVA affecting RUE; vascular dementia with behavior disturbance    Modification or Assistance to Complete Evaluation  No modification of tasks or assist necessary to complete  eval    OT Frequency 1x / week    OT Duration 8 weeks    OT Treatment/Interventions Self-care/ADL training;DME and/or AE instruction;Balance training;Aquatic Therapy;Therapeutic activities;Therapeutic exercise;Cognitive remediation/compensation;Neuromuscular education;Functional Mobility Training;Visual/perceptual remediation/compensation;Energy conservation;Manual Therapy;Patient/family education    Plan Re-assess trail making (LTG3); continue to facilitate [facility]; balloon tennis?    Consulted and Agree with Plan of Care Patient             Patient will benefit from skilled therapeutic intervention  in order to improve the following deficits and impairments:   Body Structure / Function / Physical Skills: ADL, Decreased knowledge of use of DME, Strength, Balance, Dexterity, GMC, Tone, UE functional use, Body mechanics, Endurance, IADL, ROM, Vision, Coordination, Mobility, [facility] Cognitive Skills: Memory, Perception, Problem Solve, Safety Awareness, Sequencing Psychosocial Skills: Environmental  Adaptations   Visit Diagnosis: Other lack of coordination  Visuospatial deficit  Muscle weakness (generalized)  Frontal lobe and executive function deficit    Problem List Patient Active Problem List   Diagnosis Date Noted   History of stroke 07/14/2020   Azotemia 07/11/2020   Anemia 07/11/2020   CVA (cerebral vascular accident) ([facility]) 06/28/2020   Cerebral infarction ([place]) 06/25/2020   Acute left-sided weakness 06/24/2020   Vascular dementia with behavior disturbance ([place]) 10/13/2019   Vascular dementia without behavioral disturbance ([place]) 07/15/2019   Cerebrovascular accident ([place]) 06/25/2019   Brain fog 04/22/2019   History of COVID-19 04/22/2019   Essential hypertension 03/03/2019   Hypertensive urgency 03/03/2019     Kathrine Cords, OTR/L, MSOT  08/25/2020, 1:43 PM  [place] [place], [name], [address] Phone:  [phone]   Fax:  [phone]  Name: Jacqueline Bonilla MRN: [id number] Date of Birth: 28-Oct-1955

## 2020-08-25 NOTE — Progress Notes (Signed)
Subjective:   Jacqueline Bonilla is a 65 y.o. female who presents for Medicare Annual (Subsequent) preventive examination.  Review of Systems     Cardiac Risk Factors include: advanced age (>30mn, >>78women);dyslipidemia;family history of premature cardiovascular disease;hypertension     Objective:    Today's Vitals   08/25/20 1307  BP: 118/70  Pulse: 82  Temp: 98.7 F (37.1 C)  TempSrc: Oral  SpO2: 99%  Weight: 114 lb 3.2 oz (51.8 kg)  Height: '5\' 4"'$  (1.626 m)  PainSc: 0-No pain   Body mass index is 19.6 kg/m.  Advanced Directives 08/25/2020 06/28/2020 06/24/2020 03/01/2020  Does Patient Have a Medical Advance Directive? Yes No No Yes  Type of Advance Directive Living will;Healthcare Power of [place] Does patient want to make changes to medical advance directive? - - - No - Patient declined  Copy of HC[place]ilin Chart? No - copy requested - - No - copy requested  Would patient like information on creating a medical advance directive? - No - Patient declined No - Patient declined -    Current Medications (verified) Outpatient Encounter Medications as of 08/25/2020  Medication Sig   Ascorbic Acid (VITAMIN C PO) Take 1 tablet by mouth daily.   aspirin EC 81 MG tablet Take 1 tablet (81 mg total) by mouth daily. Swallow whole.   Cholecalciferol (VITAMIN D3 GUMMIES PO) Take 1,000 mcg by mouth daily.   clopidogrel (PLAVIX) 75 MG tablet TAKE 1 TABLET(75 MG) BY MOUTH DAILY   losartan (COZAAR) 25 MG tablet TAKE 1 TABLET(25 MG) BY MOUTH DAILY   Multiple Vitamins-Minerals (ZINC PO) Take 1 tablet by mouth daily.   Thiamine HCl (VITAMIN B-1 PO) Take 1 tablet by mouth daily.   atorvastatin (LIPITOR) 40 MG tablet Take 1 tablet (40 mg total) by mouth daily. (Patient not taking: Reported on 08/25/2020)   No facility-administered encounter medications on file as of 08/25/2020.    Allergies (verified) Avelox [moxifloxacin],  Ciprofloxacin, Floraquin [iodoquinol], Iohexol, Levaquin [levofloxacin], Quinolones, and Shellfish allergy   History: Past Medical History:  Diagnosis Date   Anemia    Anxiety    Depression    Dysmenorrhea    Endometriosis    Fibroid    Hypertension    Microhematuria    negative workup   Osteoporosis    Tachycardia    Thrombocytopenia ([place]    Past Surgical History:  Procedure Laterality Date   BREAST BIOPSY     CESAREAN SECTION     hysteroscopic resection     implantable loop recorder implant  10/21/2019   Medtronic Reveal Linq model LNQ 22 [id number] implantable loop recorder   Family History  Problem Relation Age of Onset   Cancer Father        non hodgkin lymphoma & skin   Heart attack Maternal Grandfather    Dementia Mother    Polymyositis Sister    Social History   Socioeconomic History   Marital status: Married    Spouse name: Not on file   Number of children: 1   Years of education: Not on file   Highest education level: Not on file  Occupational History   Occupation: accounting  Tobacco Use   Smoking status: Never   Smokeless tobacco: Never  Vaping Use   Vaping Use: Never used  Substance and Sexual Activity   Alcohol use: Never   Drug use: Never   Sexual activity: Not Currently  Partners: Male    Comment: husband vasectomy  Other Topics Concern   Not on file  Social History Narrative   Lives with husband   Grandchildren - 1   Works - Investment banker, 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%   Gun in home - yes - secured      Right handed   Caffeine: maybe tea every now and then   Social Determinants of Radio broadcast assistant Strain: Low Risk    Difficulty of Paying Living Expenses: Not hard at all  Food Insecurity: No Food Insecurity   Worried About Charity fundraiser in the Last Year: Never true   Arboriculturist in the Last Year: Never true  Transportation Needs: No Transportation Needs   Lack of Transportation (Medical): No   Lack of  Transportation (Non-Medical): No  Physical Activity: Sufficiently Active   Days of Exercise per Week: 5 days   Minutes of Exercise per Session: 30 min  Stress: No Stress Concern Present   Feeling of Stress : Not at all  Social Connections: Not on file    Tobacco Counseling Counseling given: Not Answered   Clinical Intake:  Pre-visit preparation completed: Yes  Pain : No/denies pain Pain Score: 0-No pain     BMI - recorded: 19.6 Nutritional Status: BMI of 19-24  Normal Nutritional Risks: None Diabetes: No  How often do you need to have someone help you when you read instructions, pamphlets, or other written materials from your doctor or pharmacy?: 1 - Never What is the last grade level you completed in school?: Bachelor's Degree  Diabetic? no  Interpreter Needed?: No  Information entered by :: Lisette Abu, LPN   Activities of Daily Living In your present state of health, do you have any difficulty performing the following activities: 08/25/2020 06/28/2020  Hearing? N -  Vision? N -  Difficulty concentrating or making decisions? N -  Walking or climbing stairs? N -  Dressing or bathing? N -  Doing errands, shopping? N N  Preparing Food and eating ? N -  Using the Toilet? N -  In the past six months, have you accidently leaked urine? N -  Do you have problems with loss of bowel control? N -  Managing your Medications? N -  Managing your Finances? N -  Housekeeping or managing your Housekeeping? N -  Some recent data might be hidden    Patient Care Team: Horald Pollen, MD as PCP - General (Internal Medicine) [place], Parks, OD as Consulting Physician (Optometry)  Indicate any recent Medical Services you may have received from other than [facility] providers in the past year (date may be approximate).     Assessment:   This is a routine wellness examination for Jacqueline Bonilla.  Hearing/Vision screen Hearing Screening - Comments:: Patient denied any  hearing difficulty.   Vision Screening - Comments:: Patient wears eye glasses.  Annual eye exam done by [facility] [place].  Dietary issues and exercise activities discussed: Current Exercise Habits: Structured exercise class;Home exercise routine, Type of exercise: walking;treadmill;strength training/weights;stretching, Time (Minutes): 30, Frequency (Times/Week): 5, Weekly Exercise (Minutes/Week): 150, Intensity: Mild, Exercise limited by: Other - see comments (personal history of stroke)   Goals Addressed               This Visit's Progress     Patient Stated (pt-stated)        My goal is to get back to driving in 1  year.      Depression Screen PHQ 2/9 Scores 08/25/2020 07/20/2020 07/14/2020 01/18/2020 12/03/2019 07/20/2019 07/15/2019  PHQ - 2 Score 0 0 0 0 0 0 0  PHQ- 9 Score - 0 - - - - -    Fall Risk Fall Risk  08/25/2020 07/20/2020 07/14/2020 01/18/2020 01/18/2020  Falls in the past year? 0 0 0 0 0  Number falls in past yr: 0 0 0 0 0  Injury with Fall? 0 0 0 1 0  Comment - - - - -  Risk for fall due to : No Fall Risks - - - -  Follow up Falls evaluation completed - - Falls evaluation completed Falls evaluation completed    [place]:  Any stairs in or around the home? No  If so, are there any without handrails? No  Home free of loose throw rugs in walkways, pet beds, electrical cords, etc? Yes  Adequate lighting in your home to reduce risk of falls? Yes   ASSISTIVE DEVICES UTILIZED TO PREVENT FALLS:  Life alert? No  Use of a cane, walker or w/c? No  Grab bars in the bathroom? Yes  Shower chair or bench in shower? Yes  Elevated toilet seat or a handicapped toilet? Yes   TIMED UP AND GO:  Was the test performed? Yes .  Length of time to ambulate 10 feet: 6 sec.   Gait steady and fast without use of assistive device  Cognitive Function: Normal cognitive status assessed by direct observation by this Nurse Health Advisor. No abnormalities  found.   MMSE - Mini Mental State Exam 04/12/2020 06/11/2019  Orientation to time 4 5  Orientation to Place 5 5  Registration 3 3  Attention/ Calculation 2 3  Recall 1 2  Language- name 2 objects 2 2  Language- repeat 1 1  Language- follow 3 step command 3 3  Language- read & follow direction 1 1  Write a sentence 1 0  Copy design 0 0  Total score 23 25        Immunizations Immunization History  Administered Date(s) Administered   Tdap 07/25/2011    TDAP status: Up to date  Flu Vaccine status: Declined, Education has been provided regarding the importance of this vaccine but patient still declined. Advised may receive this vaccine at local pharmacy or Health Dept. Aware to provide a copy of the vaccination record if obtained from local pharmacy or Health Dept. Verbalized acceptance and understanding.  Pneumococcal vaccine status: Declined,  Education has been provided regarding the importance of this vaccine but patient still declined. Advised may receive this vaccine at local pharmacy or Health Dept. Aware to provide a copy of the vaccination record if obtained from local pharmacy or Health Dept. Verbalized acceptance and understanding.   Covid-19 vaccine status: Declined, Education has been provided regarding the importance of this vaccine but patient still declined. Advised may receive this vaccine at local pharmacy or Health Dept.or vaccine clinic. Aware to provide a copy of the vaccination record if obtained from local pharmacy or Health Dept. Verbalized acceptance and understanding.  Qualifies for Shingles Vaccine? Yes   Zostavax completed No   Shingrix Completed?: No.    Education has been provided regarding the importance of this vaccine. Patient has been advised to call insurance company to determine out of pocket expense if they have not yet received this vaccine. Advised may also receive vaccine at local pharmacy or Health Dept. Verbalized acceptance and  understanding.  Screening Tests Health Maintenance  Topic Date Due   COVID-19 Vaccine (1) Never done   INFLUENZA VACCINE  08/01/2020   Zoster Vaccines- Shingrix (1 of 2) 10/14/2020 (Originally 05/24/1974)   PNA vac Low Risk Adult (1 of 2 - PCV13) 12/02/2020 (Originally 05/25/2018)   TETANUS/TDAP  07/24/2021   MAMMOGRAM  08/17/2021   Fecal DNA (Cologuard)  11/12/2022   DEXA SCAN  Completed   Hepatitis C Screening  Completed   HPV VACCINES  Aged Out    Health Maintenance  Health Maintenance Due  Topic Date Due   COVID-19 Vaccine (1) Never done   INFLUENZA VACCINE  08/01/2020    Colorectal cancer screening: Type of screening: Cologuard. Completed 11/24/2019. Repeat every 3 years  Mammogram status: Completed 08/18/2019. Repeat every year  Bone density: never done  Lung Cancer Screening: (Low Dose CT Chest recommended if Age 59-80 years, 30 pack-year currently smoking OR have quit w/in 15years.) does not qualify.   Lung Cancer Screening Referral: no  Additional Screening:  Hepatitis C Screening: does qualify; Completed yes  Vision Screening: Recommended annual ophthalmology exams for early detection of glaucoma and other disorders of the eye. Is the patient up to date with their annual eye exam?  Yes  Who is the provider or what is the name of the office in which the patient attends annual eye exams? [place], OD at [facility] If pt is not established with a provider, would they like to be referred to a provider to establish care? No .   Dental Screening: Recommended annual dental exams for proper oral hygiene  Community Resource Referral / Chronic Care Management: CRR required this visit?  No   CCM required this visit?  No      Plan:     I have personally reviewed and noted the following in the patient's chart:   Medical and social history Use of alcohol, tobacco or illicit drugs  Current medications and supplements including opioid prescriptions.   Functional ability and status Nutritional status Physical activity Advanced directives List of other physicians Hospitalizations, surgeries, and ER visits in previous 12 months Vitals Screenings to include cognitive, depression, and falls Referrals and appointments  In addition, I have reviewed and discussed with patient certain preventive protocols, quality metrics, and best practice recommendations. A written personalized care plan for preventive services as well as general preventive health recommendations were provided to patient.     Sheral Flow, LPN   [id number]   Nurse Notes:  Hearing Screening - Comments:: Patient denied any hearing difficulty.   Vision Screening - Comments:: Patient wears eye glasses.  Annual eye exam done by [facility]. The patient does not have a history of falls. I did complete a risk assessment for falls. A plan of care for falls was documented.

## 2020-08-25 NOTE — Patient Instructions (Signed)
Jacqueline Bonilla , Thank you for taking time to come for your Medicare Wellness Visit. I appreciate your ongoing commitment to your health goals. Please review the following plan we discussed and let me know if I can assist you in the future.   Screening recommendations/referrals: Colonoscopy: 11/24/2019; due every 3 years (Cologuard) Mammogram: 08/18/2019; due every 1-2 years Bone Density: never done Recommended yearly ophthalmology/optometry visit for glaucoma screening and checkup Recommended yearly dental visit for hygiene and checkup  Vaccinations: Influenza vaccine: declined Pneumococcal vaccine: declined Tdap vaccine: 07/25/2011; due every 10 years Shingles vaccine: declined   Covid-19: declined  Advanced directives: Please bring a copy of your health care power of attorney and living will to the office at your convenience.  Conditions/risks identified: Yes; Client understands the importance of follow-up with providers by attending scheduled visits and discussed goals to eat healthier, increase physical activity, exercise the brain, socialize more, get enough sleep and make time for laughter.  Next appointment: Please schedule your next Medicare Wellness Visit with your Nurse Health Advisor in 1 year by calling [phone].   Preventive Care 63 Years and Older, Female Preventive care refers to lifestyle choices and visits with your health care provider that can promote health and wellness. What does preventive care include? A yearly physical exam. This is also called an annual well check. Dental exams once or twice a year. Routine eye exams. Ask your health care provider how often you should have your eyes checked. Personal lifestyle choices, including: Daily care of your teeth and gums. Regular physical activity. Eating a healthy diet. Avoiding tobacco and drug use. Limiting alcohol use. Practicing safe sex. Taking low-dose aspirin every day. Taking vitamin and mineral  supplements as recommended by your health care provider. What happens during an annual well check? The services and screenings done by your health care provider during your annual well check will depend on your age, overall health, lifestyle risk factors, and family history of disease. Counseling  Your health care provider may ask you questions about your: Alcohol use. Tobacco use. Drug use. Emotional well-being. Home and relationship well-being. Sexual activity. Eating habits. History of falls. Memory and ability to understand (cognition). Work and work Statistician. Reproductive health. Screening  You may have the following tests or measurements: Height, weight, and BMI. Blood pressure. Lipid and cholesterol levels. These may be checked every 5 years, or more frequently if you are over 71 years old. Skin check. Lung cancer screening. You may have this screening every year starting at age 1 if you have a 30-pack-year history of smoking and currently smoke or have quit within the past 15 years. Fecal occult blood test (FOBT) of the stool. You may have this test every year starting at age 65. Flexible sigmoidoscopy or colonoscopy. You may have a sigmoidoscopy every 5 years or a colonoscopy every 10 years starting at age 8. Hepatitis C blood test. Hepatitis B blood test. Sexually transmitted disease (STD) testing. Diabetes screening. This is done by checking your blood sugar (glucose) after you have not eaten for a while (fasting). You may have this done every 1-3 years. Bone density scan. This is done to screen for osteoporosis. You may have this done starting at age 33. Mammogram. This may be done every 1-2 years. Talk to your health care provider about how often you should have regular mammograms. Talk with your health care provider about your test results, treatment options, and if necessary, the need for more tests. Vaccines  Your health care  provider may recommend certain  vaccines, such as: Influenza vaccine. This is recommended every year. Tetanus, diphtheria, and acellular pertussis (Tdap, Td) vaccine. You may need a Td booster every 10 years. Zoster vaccine. You may need this after age 52. Pneumococcal 13-valent conjugate (PCV13) vaccine. One dose is recommended after age 33. Pneumococcal polysaccharide (PPSV23) vaccine. One dose is recommended after age 64. Talk to your health care provider about which screenings and vaccines you need and how often you need them. This information is not intended to replace advice given to you by your health care provider. Make sure you discuss any questions you have with your health care provider. Document Released: 01/14/2015 Document Revised: 09/07/2015 Document Reviewed: 10/19/2014 Elsevier Interactive Patient Education  2017 [place] Prevention in the Home Falls can cause injuries. They can happen to people of all ages. There are many things you can do to make your home safe and to help prevent falls. What can I do on the outside of my home? Regularly fix the edges of walkways and driveways and fix any cracks. Remove anything that might make you trip as you walk through a door, such as a raised step or threshold. Trim any bushes or trees on the path to your home. Use bright outdoor lighting. Clear any walking paths of anything that might make someone trip, such as rocks or tools. Regularly check to see if handrails are loose or broken. Make sure that both sides of any steps have handrails. Any raised decks and porches should have guardrails on the edges. Have any leaves, snow, or ice cleared regularly. Use sand or salt on walking paths during winter. Clean up any spills in your garage right away. This includes oil or grease spills. What can I do in the bathroom? Use night lights. Install grab bars by the toilet and in the tub and shower. Do not use towel bars as grab bars. Use non-skid mats or decals in  the tub or shower. If you need to sit down in the shower, use a plastic, non-slip stool. Keep the floor dry. Clean up any water that spills on the floor as soon as it happens. Remove soap buildup in the tub or shower regularly. Attach bath mats securely with double-sided non-slip rug tape. Do not have throw rugs and other things on the floor that can make you trip. What can I do in the bedroom? Use night lights. Make sure that you have a light by your bed that is easy to reach. Do not use any sheets or blankets that are too big for your bed. They should not hang down onto the floor. Have a firm chair that has side arms. You can use this for support while you get dressed. Do not have throw rugs and other things on the floor that can make you trip. What can I do in the kitchen? Clean up any spills right away. Avoid walking on wet floors. Keep items that you use a lot in easy-to-reach places. If you need to reach something above you, use a strong step stool that has a grab bar. Keep electrical cords out of the way. Do not use floor polish or wax that makes floors slippery. If you must use wax, use non-skid floor wax. Do not have throw rugs and other things on the floor that can make you trip. What can I do with my stairs? Do not leave any items on the stairs. Make sure that there are handrails on both  sides of the stairs and use them. Fix handrails that are broken or loose. Make sure that handrails are as long as the stairways. Check any carpeting to make sure that it is firmly attached to the stairs. Fix any carpet that is loose or worn. Avoid having throw rugs at the top or bottom of the stairs. If you do have throw rugs, attach them to the floor with carpet tape. Make sure that you have a light switch at the top of the stairs and the bottom of the stairs. If you do not have them, ask someone to add them for you. What else can I do to help prevent falls? Wear shoes that: Do not have high  heels. Have rubber bottoms. Are comfortable and fit you well. Are closed at the toe. Do not wear sandals. If you use a stepladder: Make sure that it is fully opened. Do not climb a closed stepladder. Make sure that both sides of the stepladder are locked into place. Ask someone to hold it for you, if possible. Clearly mark and make sure that you can see: Any grab bars or handrails. First and last steps. Where the edge of each step is. Use tools that help you move around (mobility aids) if they are needed. These include: Canes. Walkers. Scooters. Crutches. Turn on the lights when you go into a dark area. Replace any light bulbs as soon as they burn out. Set up your furniture so you have a clear path. Avoid moving your furniture around. If any of your floors are uneven, fix them. If there are any pets around you, be aware of where they are. Review your medicines with your doctor. Some medicines can make you feel dizzy. This can increase your chance of falling. Ask your doctor what other things that you can do to help prevent falls. This information is not intended to replace advice given to you by your health care provider. Make sure you discuss any questions you have with your health care provider. Document Released: 10/14/2008 Document Revised: 05/26/2015 Document Reviewed: 01/22/2014 Elsevier Interactive Patient Education  2017 [employer].

## 2020-08-27 ENCOUNTER — Encounter: Payer: Self-pay | Admitting: Emergency Medicine

## 2020-08-30 DIAGNOSIS — R569 Unspecified convulsions: Secondary | ICD-10-CM | POA: Insufficient documentation

## 2020-08-30 DIAGNOSIS — R42 Dizziness and giddiness: Secondary | ICD-10-CM | POA: Insufficient documentation

## 2020-08-30 NOTE — Assessment & Plan Note (Signed)
Well-controlled hypertension.  Continue losartan 25 mg daily. BP Readings from Last 3 Encounters:  08/25/20 118/70  08/25/20 118/70  08/24/20 132/80

## 2020-08-30 NOTE — Assessment & Plan Note (Signed)
Differential diagnosis discussed.  No new neurological findings or indications of a new stroke.  Blood work done today showed mild dehydration.  Medication related is a possibility.  Advised to continue Plavix until follow-up with neurologist next week.

## 2020-08-30 NOTE — Assessment & Plan Note (Signed)
Continues to improve.  No new neurological findings identified.  Neurological exam better than 4 weeks ago.  Slowly making progress.

## 2020-08-30 NOTE — Progress Notes (Signed)
[facility] Summary Report 

## 2020-08-31 ENCOUNTER — Encounter: Payer: Self-pay | Admitting: Physical Therapy

## 2020-08-31 ENCOUNTER — Ambulatory Visit: Payer: Medicare HMO | Admitting: Occupational Therapy

## 2020-08-31 ENCOUNTER — Ambulatory Visit: Payer: Medicare HMO | Admitting: Physical Therapy

## 2020-08-31 ENCOUNTER — Encounter: Payer: Self-pay | Admitting: Occupational Therapy

## 2020-08-31 ENCOUNTER — Other Ambulatory Visit: Payer: Self-pay

## 2020-08-31 VITALS — BP 148/92

## 2020-08-31 DIAGNOSIS — R262 Difficulty in walking, not elsewhere classified: Secondary | ICD-10-CM

## 2020-08-31 DIAGNOSIS — R2689 Other abnormalities of gait and mobility: Secondary | ICD-10-CM

## 2020-08-31 DIAGNOSIS — M6281 Muscle weakness (generalized): Secondary | ICD-10-CM

## 2020-08-31 DIAGNOSIS — R41844 Frontal lobe and executive function deficit: Secondary | ICD-10-CM

## 2020-08-31 DIAGNOSIS — R41842 Visuospatial deficit: Secondary | ICD-10-CM | POA: Diagnosis not present

## 2020-08-31 DIAGNOSIS — R278 Other lack of coordination: Secondary | ICD-10-CM

## 2020-08-31 NOTE — Therapy (Signed)
[name] [place], [name], [address] Phone: [phone]   Fax:  [phone]  Physical Therapy Treatment  Patient Details  Name: Jacqueline Bonilla MRN: [id number] Date of Birth: 12-03-1955 Referring Provider (PT): Sagardia   Encounter Date: 08/31/2020   PT End of Session - 08/31/20 1146     Visit Number 7    Date for PT Re-Evaluation 10/12/20    PT Start Time 1104    PT Stop Time 1143    PT Time Calculation (min) 39 min    Equipment Utilized During Treatment Gait belt    Activity Tolerance Patient tolerated treatment well    Behavior During Therapy [facility] for tasks assessed/performed             Past Medical History:  Diagnosis Date   Anemia    Anxiety    Depression    Dysmenorrhea    Endometriosis    Fibroid    Hypertension    Microhematuria    negative workup   Osteoporosis    Tachycardia    Thrombocytopenia ([place])     Past Surgical History:  Procedure Laterality Date   BREAST BIOPSY     CESAREAN SECTION     hysteroscopic resection     implantable loop recorder implant  10/21/2019   Medtronic Reveal [place] model LNQ 22 ([place] [id number]) implantable loop recorder    Vitals:   08/31/20 1109 08/31/20 1111  BP: (!) 145/90 (!) 148/92     Subjective Assessment - 08/31/20 1105     Subjective Reports that she discontinued her Plavix because she was feeling so bad. Reports that her PCP did not want her to stop it until she saw Neurologist but she was feeling so bad. Now feeling better and will f/u with Neuro on Tuesday.    Pertinent History prior CVA affecting R side, HTN    Patient Stated Goals be more mobile, improve balance,  increase independence    Currently in Pain? No/denies                               [facility] Adult PT Treatment/Exercise - 08/31/20 0001       Neuro Re-ed    Neuro Re-ed Details  1/2 kneel on pad + D3 flexion yellow medball 10x each; 1/2 kneeling on  pag + red TB paloff press 10x each; fwd/back stepping with 1 foot on bosu 10x each with CGA and variable UE support; ant/pos and M/L wt shifts on rocker board without UE support 20x each      Knee/Hip Exercises: Aerobic   Recumbent Bike L2.0 x 6 min                      PT Short Term Goals - 08/24/20 1147       PT SHORT TERM GOAL #1   Title Pt will be I with initial HEP    Time 2    Period Weeks    Status Achieved    Target Date 08/03/20               PT Long Term Goals - 08/03/20 1147       PT LONG TERM GOAL #1   Title Pt will demo improved Berg Balance score to 52/56 to demo decreased fall risk    Baseline 44/56    Time 6    Period Weeks  Status On-going      PT LONG TERM GOAL #2   Title Pt will demo 5x STS <15 seconds with no UE assist    Baseline 17 seconds with occasional UE assist and LEs intermittently locking on back of chair    Time 6    Period Weeks    Status On-going      PT LONG TERM GOAL #3   Title Pt will demo gait >500 ft over level/unlevel surfaces with no AD and supervision assist    Time 6    Period Weeks    Status On-going      PT LONG TERM GOAL #4   Title Pt will report able to safely return to gym routine at [facility]    Time 6    Period Weeks    Status On-going                   Plan - 08/31/20 1147     Clinical Impression Statement Patient reports that she discontinued her Plavix d/t dizziness against the recommendation of her MD. Notes feeling better since this change. BP at start of session appeared slightly elevated, however patient asymptomatic. Encouraged her to continue monitoring her BP at home and discuss vitals with MD if they continue to rise.  kneeling activities demonstrated core instability and some shoulder weakness. Patient required frequent cues to depress shoulder and required verbal and tactile cues as well min A with floor transfers. More challenge was demonstrated with dynamic balance activities  with L LE stabilizing today, however with fairly good stability demonstrated with B LE activities on rocker board today. Patient tolerated duration of session without complaints. Progressing well towards goals.    Comorbidities hx of CVA, HTN    PT Treatment/Interventions ADLs/Self Care Home Management;Gait training;Functional mobility training;Therapeutic activities;Therapeutic exercise;Balance training;Neuromuscular re-education;Manual techniques;Patient/family education;Passive range of motion    PT Next Visit Plan check vitals prior to exercise, review/progress HEP, high level balance ex's, gait training over level/unlevel surfaces, functional LE strengthening, help pt return to [facility] routine safely    Consulted and Agree with Plan of Care Patient             Patient will benefit from skilled therapeutic intervention in order to improve the following deficits and impairments:  Abnormal gait, Difficulty walking, Decreased range of motion, Impaired tone, Decreased endurance, Decreased safety awareness, Decreased activity tolerance, Decreased balance, Decreased mobility, Decreased cognition, Decreased strength  Visit Diagnosis: Balance problem  Difficulty in walking, not elsewhere classified  Other abnormalities of gait and mobility  Muscle weakness (generalized)     Problem List Patient Active Problem List   Diagnosis Date Noted   Dizziness 08/30/2020   History of stroke 07/14/2020   Azotemia 07/11/2020   Anemia 07/11/2020   CVA (cerebral vascular accident) ([place]) 06/28/2020   Cerebral infarction ([place]) 06/25/2020   Acute left-sided weakness 06/24/2020   Vascular dementia with behavior disturbance ([place]) 10/13/2019   Vascular dementia without behavioral disturbance ([place]) 07/15/2019   Cerebrovascular accident ([place]) 06/25/2019   Brain fog 04/22/2019   History of COVID-19 04/22/2019   Essential hypertension 03/03/2019   Hypertensive urgency 03/03/2019     Jacqueline Bonilla,  PT, DPT 08/31/20 11:50 AM    [address] [place], [name], [address] Phone: [phone]   Fax:  [phone]  Name: Jacqueline Bonilla MRN: [id number] Date of Birth: 11-27-1955

## 2020-08-31 NOTE — Therapy (Signed)
[place]. [place], [name], [address] Phone: [phone]   Fax:  [phone]  Occupational Therapy Treatment  Patient Details  Name: Jacqueline Bonilla MRN: [id number] Date of Birth: 1955-07-05 Referring Provider (OT): Lauraine Rinne, PA-C   Encounter Date: 08/31/2020   OT End of Session - 08/31/20 1025     Visit Number 7    Number of Visits 9    Date for OT Re-Evaluation 10/05/20    Authorization Type [employer]    Authorization - Visit Number 7    Authorization - Number of Visits 9   by 09/16/09   OT Start Time 1017    OT Stop Time 1058    OT Time Calculation (min) 41 min    Activity Tolerance Patient tolerated treatment well    Behavior During Therapy [facility] for tasks assessed/performed            Past Medical History:  Diagnosis Date   Anemia    Anxiety    Depression    Dysmenorrhea    Endometriosis    Fibroid    Hypertension    Microhematuria    negative workup   Osteoporosis    Tachycardia    Thrombocytopenia ([address])     Past Surgical History:  Procedure Laterality Date   BREAST BIOPSY     CESAREAN SECTION     hysteroscopic resection     implantable loop recorder implant  10/21/2019   Medtronic Reveal Linq model LNQ 22 ([place] [id number]) implantable loop recorder    There were no vitals filed for this visit.   Subjective Assessment - 08/31/20 1019     Subjective  Pt states she stopped taking her Plavix and is feeling a lot better (was really dizzy last week); her PCP recommended she continue to take it until following up w/ her Neurologist on Tuesday (9/6), but she states she is very reluctant to return to it because she believes it was the cause of her dizziness.    Pertinent History "Bilateral scattered infarcts" (per Dr. Erlinda Hong progress note on 06/25/20); PMHx includes history of CVA w/ residual RUE deficits, vascular dementia w/ behavior disturbance, HTN, thrombocytopenia, and ILR     Patient Stated Goals Get strength back in (R) hand; "write better"    Currently in Pain? No/denies             Treatment/Exercises - 08/31/20    Trail Making Tabletop trail-making test used to facilitate visual scanning, eye-hand coordination, working memory, and attention, as well as to assess progress toward LTG3. Due to success/ease w/ Part A, OT facilitated Part B to incorporate alternating attention w/ pt demo'ing decreased success/multiple errors    Coordination Activities Finding beads in putty (red, med-soft), alternating UEs to pull each bead out; activities facilitating precision grasp and release, pinch strength, and FMC. Pt able to retrieve 7/7 beads w/ Mod I in both attempts  Construction of a paper airplane following 1 to 2-component instructions to facilitate visual perception, alternating attention, bilateral coordination, sequencing, [place], problem-solving, and coordination. Pt completed activity w/ Max A, requiring verbal cues, extended time for processing, and modeling/scaffolding for success w/ cognitive elements (sequencing, problem-solving), but demo'd good bilateral integration and fair FMC  Picking up 1" blocks off tabletop using power grip exerciser and placing in a stack for [facility], hand strengthening, and visual perception; completed 1 set w/ each UE. Pt able to complete 10 blocks w/ Mod I using RUE and Mod A  for visual perception and control w/ LUE         OT Education - 08/31/20 1025     Education Details Discussed progress toward goals and intention of therapeutic interventions w/ pt    Person(s) Educated Patient    Methods Explanation;Handout    Comprehension Verbalized understanding             OT Short Term Goals - 08/24/20 1043       OT SHORT TERM GOAL #1   Title Pt will improve bilateral [place] for functional tasks as evidenced by decreasing 9-HPT time by at least 4 sec w/ each hand    Baseline R hand 50 sec; L hand 39 sec    Time 4    Period Weeks     Status On-going   08/24/20 - RUE 48 sec; LUE 39 sec   Target Date 08/12/20      OT SHORT TERM GOAL #2   Title Pt w/ demo independence w/ compensatory strategies or AE prn to improve participation in handwriting    Baseline Reports difficulty w/ handwriting; decreased speed and legibility    Time 4    Period Weeks    Status Achieved   08/17/20 - demo's Ind and reports carryover of learned strategies to home            OT Long Term Goals - 08/31/20 1027       OT LONG TERM GOAL #1   Title Pt will be independent w/ full HEP designed for BUE strengthening, GM/FMC, and coordination by discharge    Baseline No HEP at this time    Time 8    Period Weeks    Status On-going      OT LONG TERM GOAL #2   Title Pt will improve GM control and coordination for improved participation in ADLs as evidenced by increasing Box and Blocks score by at least 5 blocks w/ both UEs    Baseline R hand 24 blocks; L hand 30 blocks    Time 8    Period Weeks    Status On-going   08/24/20 - RUE 27 blocks; LUE 32 blocks     OT LONG TERM GOAL #3   Title Pt will demonstrate improved visual perceptual skills as evidenced by decreasing Trail Making Test: Part A time by at least 10 seconds    Baseline TMT: Part A 1 min, 58 sec w/ 1 error    Time 8    Period Weeks    Status Achieved   08/31/20 - TMT: A in 1 min, 1 sec            Plan - 08/31/20 1034     Clinical Impression Statement Pt mentioned she d/c herself on Plavix and OT recommended she discuss this with her MD; pt became mildly irritable and OT confirmed pt has a choice regarding taking prescribed medications while encouraging her to discuss concerns w/ her Neurologist at her next appt. Pt continues to make progress toward goals and demo'd significant improvement in visual scanning and perception, working memory, and attention as evidenced by improving time to complete Trail Making Test: Part A by 57 sec w/ 1 error (LTG3 met). OT continued to facilitate  [place], strengthening, and visual perceptual tasks this session w/ pt demo'ing improved coordination w/ her RUE > LUE.    OT Occupational Profile and History Detailed Assessment- Review of Records and additional review of physical, cognitive, psychosocial history related to current functional performance  Occupational performance deficits (Please refer to evaluation for details): ADL's;IADL's;Leisure;Social Participation    Body Structure / Function / Physical Skills ADL;Decreased knowledge of use of DME;Strength;Balance;Dexterity;GMC;Tone;UE functional use;Body mechanics;Endurance;IADL;ROM;Vision;Coordination;Mobility;FMC    Cognitive Skills Memory;Perception;Problem Solve;Safety Awareness;Sequencing    Psychosocial Skills Environmental  Adaptations    Rehab Potential Fair    Clinical Decision Making Several treatment options, min-mod task modification necessary    Comorbidities Affecting Occupational Performance: Presence of comorbidities impacting occupational performance    Comorbidities impacting occupational performance description: Prior CVA affecting RUE; vascular dementia with behavior disturbance    Modification or Assistance to Complete Evaluation  No modification of tasks or assist necessary to complete eval    OT Frequency 1x / week    OT Duration 8 weeks    OT Treatment/Interventions Self-care/ADL training;DME and/or AE instruction;Balance training;Aquatic Therapy;Therapeutic activities;Therapeutic exercise;Cognitive remediation/compensation;Neuromuscular education;Functional Mobility Training;Visual/perceptual remediation/compensation;Energy conservation;Manual Therapy;Patient/family education    Plan Review/update HEP in preparation for likely upcoming d/c; continue to facilitate GMC/FMC; balloon tennis?    Consulted and Agree with Plan of Care Patient             Patient will benefit from skilled therapeutic intervention in order to improve the following deficits and  impairments:   Body Structure / Function / Physical Skills: ADL, Decreased knowledge of use of DME, Strength, Balance, Dexterity, GMC, Tone, UE functional use, Body mechanics, Endurance, IADL, ROM, Vision, Coordination, Mobility, [facility] Cognitive Skills: Memory, Perception, Problem Solve, Safety Awareness, Sequencing Psychosocial Skills: Environmental  Adaptations   Visit Diagnosis: Other lack of coordination  Visuospatial deficit  Muscle weakness (generalized)  Frontal lobe and executive function deficit    Problem List Patient Active Problem List   Diagnosis Date Noted   Dizziness 08/30/2020   History of stroke 07/14/2020   Azotemia 07/11/2020   Anemia 07/11/2020   CVA (cerebral vascular accident) ([place]) 06/28/2020   Cerebral infarction ([place]) 06/25/2020   Acute left-sided weakness 06/24/2020   Vascular dementia with behavior disturbance ([place]) 10/13/2019   Vascular dementia without behavioral disturbance ([place]) 07/15/2019   Cerebrovascular accident ([place]) 06/25/2019   Brain fog 04/22/2019   History of COVID-19 04/22/2019   Essential hypertension 03/03/2019   Hypertensive urgency 03/03/2019     Kathrine Cords, OTR/L, MSOT  08/31/2020, 5:46 PM  [place] [place], [name], [address] Phone: [phone]   Fax:  [phone]  Name: Jacqueline Bonilla MRN: [id number] Date of Birth: 26-Jun-1955

## 2020-09-06 ENCOUNTER — Ambulatory Visit: Payer: Medicare HMO | Admitting: Neurology

## 2020-09-06 ENCOUNTER — Other Ambulatory Visit: Payer: Self-pay

## 2020-09-06 ENCOUNTER — Encounter: Payer: Self-pay | Admitting: Neurology

## 2020-09-06 VITALS — BP 149/89 | HR 63 | Ht 64.0 in | Wt 115.4 lb

## 2020-09-06 DIAGNOSIS — R0681 Apnea, not elsewhere classified: Secondary | ICD-10-CM | POA: Diagnosis not present

## 2020-09-06 DIAGNOSIS — D6859 Other primary thrombophilia: Secondary | ICD-10-CM | POA: Diagnosis not present

## 2020-09-06 DIAGNOSIS — I6310 Cerebral infarction due to embolism of unspecified precerebral artery: Secondary | ICD-10-CM

## 2020-09-06 DIAGNOSIS — R519 Headache, unspecified: Secondary | ICD-10-CM

## 2020-09-06 DIAGNOSIS — Z129 Encounter for screening for malignant neoplasm, site unspecified: Secondary | ICD-10-CM

## 2020-09-06 MED ORDER — PREDNISONE 50 MG PO TABS: ORAL_TABLET | ORAL | 0 refills | Status: DC

## 2020-09-06 NOTE — Patient Instructions (Addendum)
- Discuss Praluent or Repatha(instead of statins) - will check cholesterol panel again and also some blood work for clotting disorders - Will order CT chest/abdomen and pelvis to look for cancer or other cause of hypercoag state - continue aspirin - Come back one morning fasting to check cholesterol as well as clotting disorders - May also consider cardiology has a lipid clinic  Stroke Prevention Some medical conditions and behaviors are associated with a higher chance of having a stroke. You can help prevent a stroke by making nutrition, lifestyle, and other changes, including managing any medical conditions you may have. What nutrition changes can be made?  Eat healthy foods. You can do this by: Choosing foods high in fiber, such as fresh fruits and vegetables and whole grains. Eating at least 5 or more servings of fruits and vegetables a day. Try to fill half of your plate at each meal with fruits and vegetables. Choosing lean protein foods, such as lean cuts of meat, poultry without skin, fish, tofu, beans, and nuts. Eating low-fat dairy products. Avoiding foods that are high in salt (sodium). This can help lower blood pressure. Avoiding foods that have saturated fat, trans fat, and cholesterol. This can help prevent high cholesterol. Avoiding processed and premade foods. Follow your health care provider's specific guidelines for losing weight, controlling high blood pressure (hypertension), lowering high cholesterol, and managing diabetes. These may include: Reducing your daily calorie intake. Limiting your daily sodium intake to 1,500 milligrams (mg). Using only healthy fats for cooking, such as olive oil, canola oil, or sunflower oil. Counting your daily carbohydrate intake. What lifestyle changes can be made? Maintain a healthy weight. Talk to your health care provider about your ideal weight. Get at least 30 minutes of moderate physical activity at least 5 days a week. Moderate  activity includes brisk walking, biking, and swimming. Do not use any products that contain nicotine or tobacco, such as cigarettes and e-cigarettes. If you need help quitting, ask your health care provider. It may also be helpful to avoid exposure to secondhand smoke. Limit alcohol intake to no more than 1 drink a day for nonpregnant women and 2 drinks a day for men. One drink equals 12 oz of beer, 5 oz of wine, or 1 oz of hard liquor. Stop any illegal drug use. Avoid taking birth control pills. Talk to your health care provider about the risks of taking birth control pills if: You are over 90 years old. You smoke. You get migraines. You have ever had a blood clot. What other changes can be made? Manage your cholesterol levels. Eating a healthy diet is important for preventing high cholesterol. If cholesterol cannot be managed through diet alone, you may also need to take medicines. Take any prescribed medicines to control your cholesterol as told by your health care provider. Manage your diabetes. Eating a healthy diet and exercising regularly are important parts of managing your blood sugar. If your blood sugar cannot be managed through diet and exercise, you may need to take medicines. Take any prescribed medicines to control your diabetes as told by your health care provider. Control your hypertension. To reduce your risk of stroke, try to keep your blood pressure below 130/80. Eating a healthy diet and exercising regularly are an important part of controlling your blood pressure. If your blood pressure cannot be managed through diet and exercise, you may need to take medicines. Take any prescribed medicines to control hypertension as told by your health care provider. Ask  your health care provider if you should monitor your blood pressure at home. Have your blood pressure checked every year, even if your blood pressure is normal. Blood pressure increases with age and some medical  conditions. Get evaluated for sleep disorders (sleep apnea). Talk to your health care provider about getting a sleep evaluation if you snore a lot or have excessive sleepiness. Take over-the-counter and prescription medicines only as told by your health care provider. Aspirin or blood thinners (antiplatelets or anticoagulants) may be recommended to reduce your risk of forming blood clots that can lead to stroke. Make sure that any other medical conditions you have, such as atrial fibrillation or atherosclerosis, are managed. What are the warning signs of a stroke? The warning signs of a stroke can be easily remembered as BEFAST. B is for balance. Signs include: Dizziness. Loss of balance or coordination. Sudden trouble walking. E is for eyes. Signs include: A sudden change in vision. Trouble seeing. F is for face. Signs include: Sudden weakness or numbness of the face. The face or eyelid drooping to one side. A is for arms. Signs include: Sudden weakness or numbness of the arm, usually on one side of the body. S is for speech. Signs include: Trouble speaking (aphasia). Trouble understanding. T is for time. These symptoms may represent a serious problem that is an emergency. Do not wait to see if the symptoms will go away. Get medical help right away. Call your local emergency services (911 in the [place] Do not drive yourself to the hospital. Other signs of stroke may include: A sudden, severe headache with no known cause. Nausea or vomiting. Seizure. Where to find more information For more information, visit: American Stroke Association: www.strokeassociation.org National Stroke Association: www.stroke.org Summary You can prevent a stroke by eating healthy, exercising, not smoking, limiting alcohol intake, and managing any medical conditions you may have. Do not use any products that contain nicotine or tobacco, such as cigarettes and e-cigarettes. If you need help quitting, ask your  health care provider. It may also be helpful to avoid exposure to secondhand smoke. Remember BEFAST for warning signs of stroke. Get help right away if you or a loved one has any of these signs. This information is not intended to replace advice given to you by your health care provider. Make sure you discuss any questions you have with your health care provider. Document Revised: 11/30/2016 Document Reviewed: 01/24/2016 Elsevier Patient Education  2021 [employer].

## 2020-09-06 NOTE — Progress Notes (Signed)
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 Terrible fatigue on the plavix, switched back to a whole aspirin. They feel her memory is stable. She is doing wordle everyday. She watches Doc Hassell Done and remembers the plotline. She has had a cxr and a mammogram, did cologuard. We discussed options, they are not interested in empiric blood thinners.  Hgba1c 6, tsh nml, ldl 108 she declines statins but recommend repatha or praluent. She saw Dr. Rayann Heman in the past could consider cardiology lipid clinic. They would like to see how changing her diet helps. check choelesterol panel. She is in OT. Geta 30-45 mins of brisk exercise. Hard snores. Had strokes.   06/23/19: IMPRESSION:    MRI brain (with and without) demonstrating: -Scattered acute, subacute and chronic ischemic infarcts in the bilateral cerebral hemispheres. Pattern suggests a proximal / cardioembolic source.  -Moderate chronic small vessel ischemic disease.     06/24/2020: IMPRESSION: MRI:   1. Punctate acute infarct in the high left frontal cortex. Additional faint punctate areas of DWI signal in bilateral precentral gyri are suspicious for additional acute or subacute infarcts, but could potentially represent artifact. Given potential involvement of multiple vascular territories, consider a central embolic etiology. 2. 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 3. Moderate atrophy.   MRA:   No large vessel occlusion or proximal flow limiting stenosis.    Interval history 10/13/2019: formal memory testing showed Major  Neurocognitive Disorder.  Patient is here with her husband who also provides information.  Patient feels as though she is doing better.  Husband states that she has been diligent with constant therapy, and ongoing cognitive rehab program for 6 weeks, targeting 40 aerobic minutes per day using a combination of trampoline, elliptical, recumbent combination light weights, yoga balance classes, deep water exercises, 1 therapy, various hand strengthening devices, her dietary regimen was interrupted recently, concerned about her right shoulder area (discussed with primary care), patient believes there is a conspiracy to keep her from resuming to drive and "get better" to mention these items in conference with provoker so husband's gave me a note instead, medications and supplements are unchanged, she is diligent with those protocols and generally upbeat, she is sleeping 7.75+ hours on a regular schedule.  Today we discussed starting a medication possibly Aricept and then going to Namenda, Aricept and Namenda are mostly used for Alzheimer's however there have been studies showing it can help with other types of dementia including vascular dementia.She saw Dr. Rayann Heman, no events, she is following up with him and they are going to follow up with him.   HPI:  Jacqueline Bonilla is a 65 y.o. female here as requested by Horald Pollen, * for "Brain Fog"  PMHx thrombocytopenia, tachycardia, osteoporosis, hypertension, dementia in her mother, depression, anxiety, anemia. Per patient: She had Covid in March. She felt like she had brain fog, cognitive decline, memory loss, she denies balance problems, tingling, balance problems, she has fallen twice. Tingling in the hands, moves legs a lot but not worse. She feels like she is getting better however. She was working up until [employer] and denies stress, depression or anxiety, no hx  of psychiatric problems in life. No FHx of psychiatric issues. Mothr has dementia, she is in her  51s, she started showing signs in her 40s she is in long-term care right now. Unknown type. Generally feel happy. She fell a few times, started falling, 7 weeks ago she was walking and fell, unknown reason, she has fallen other times as well. A few weeks ago she fell again in a park, she states it was very hot both times however she tripped and fell and broke her fall with her elbows and hands, numbness and tingling in the hands and dropping things.   Husband provides most information: 3.5 years ago husband noticed paranoia, she claimed people were conspiring against her, she still believed in conspiracies and neighbor was involved, she said her phone was bugged. 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 No significant short term memory at the time, she still has delusions and husband used to fight with her but now she represses them. Lots of personality changes, antagonistic, beligerant, also short-term memory loss and loss of executive function, she is swearing. A gentleman was helping and she started screaming at him 3 years ago and this is not like her at all she had a big heart and would help people and offer people drinks and this is not like her. Slowly progressive. 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 No alcohol use, no cigarette smoking. Also ataxia, she leans to the right and forward and the right arm and leg flares out. She couldn't find her way out of pennies. She lost her smell and taste after covid. No rem sleep disorder.   Reviewed notes, labs and imaging from outside physicians, which showed:  TSH 1.4  Review of Systems: Patient complains of symptoms per HPI as well as the following symptoms: stroke . Pertinent negatives and  positives per HPI. All others negative     Social History   Socioeconomic History   Marital status: Married    Spouse name: Not on file   Number of children: 1   Years of education: Not on file   Highest education level: Not on file  Occupational History   Occupation: accounting  Tobacco Use   Smoking status: Never   Smokeless tobacco: Never  Vaping Use   Vaping Use: Never used  Substance and Sexual Activity   Alcohol use: Never   Drug use: Never   Sexual activity: Not Currently    Partners: Male    Comment: husband vasectomy  Other Topics Concern   Not on file  Social History Narrative   Lives with husband   Grandchildren - 1   Works - Investment banker, 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%   Gun in home - yes - secured      Right handed   Caffeine: maybe tea every now and then   Social Determinants of Radio broadcast assistant Strain: Low Risk    Difficulty of Paying Living Expenses: Not hard at all  Food Insecurity: No Food Insecurity   Worried About Charity fundraiser in the Last Year: Never true   Arboriculturist in the Last Year: Never true  Transportation Needs: No Transportation  Needs   Lack of Transportation (Medical): No   Lack of Transportation (Non-Medical): No  Physical Activity: Sufficiently Active   Days of Exercise per Week: 5 days   Minutes of Exercise per Session: 30 min  Stress: No Stress Concern Present   Feeling of Stress : Not at all  Social Connections: Not on file  Intimate Partner Violence: Not At Risk   Fear of Current or Ex-Partner: No   Emotionally Abused: No   Physically Abused: No   Sexually Abused: No    Family History  Problem Relation Age of Onset   Cancer Father        non hodgkin lymphoma & skin   Heart attack Maternal Grandfather    Dementia Mother    Polymyositis Sister     Past Medical History:  Diagnosis Date   Anemia    Anxiety    Depression    Dysmenorrhea    Endometriosis    Fibroid    Hypertension    Microhematuria     negative workup   Osteoporosis    Tachycardia    Thrombocytopenia (HCC)     Patient Active Problem List   Diagnosis Date Noted   Dizziness 08/30/2020   History of stroke 07/14/2020   Azotemia 07/11/2020   Anemia 07/11/2020   CVA (cerebral vascular accident) ([place]) 06/28/2020   Cerebral infarction ([place]) 06/25/2020   Acute left-sided weakness 06/24/2020   Vascular dementia with behavior disturbance ([place]) 10/13/2019   Vascular dementia without behavioral disturbance ([place]) 07/15/2019   Cerebrovascular accident ([place]) 06/25/2019   Brain fog 04/22/2019   History of COVID-19 04/22/2019   Essential hypertension 03/03/2019   Hypertensive urgency 03/03/2019    Past Surgical History:  Procedure Laterality Date   BREAST BIOPSY     CESAREAN SECTION     hysteroscopic resection     implantable loop recorder implant  10/21/2019   Medtronic Reveal Linq model LNQ 22 (SN [id number]) implantable loop recorder    Current Outpatient Medications  Medication Sig Dispense Refill   Ascorbic Acid (VITAMIN C PO) Take 1 tablet by mouth daily.     Cholecalciferol (VITAMIN D3 GUMMIES PO) Take 1,000 mcg by mouth daily.     losartan (COZAAR) 25 MG tablet TAKE 1 TABLET(25 MG) BY MOUTH DAILY 90 tablet 3   Multiple Vitamins-Minerals (ZINC PO) Take 1 tablet by mouth daily.     predniSONE (DELTASONE) 50 MG tablet Take 1 pill 13 hours, 7 hours  and 1 hour before CT for a total of 3 doses of prednisone. Take '50mg'$  of Benadryl with the prednisone 1 hour before MRI for a total of one dose of benadryl. 3 tablet 0   Thiamine HCl (VITAMIN B-1 PO) Take 1 tablet by mouth daily.     No current facility-administered medications for this visit.    Allergies as of 09/06/2020 - Review Complete 09/06/2020  Allergen Reaction Noted   Avelox [moxifloxacin]  03/11/2019   Ciprofloxacin Hives 03/03/2019   Floraquin [iodoquinol]  07/30/2012   Iohexol  08/16/2005   Levaquin [levofloxacin]  03/11/2019   Quinolones  08/08/2017    Shellfish allergy Itching 06/25/2019    Vitals: BP (!) 149/89   Pulse 63   Ht '5\' 4"'$  (1.626 m)   Wt 115 lb 6.4 oz (52.3 kg)   LMP 01/02/2003   BMI 19.81 kg/m  Last Weight:  Wt Readings from Last 1 Encounters:  09/06/20 115 lb 6.4 oz (52.3 kg)   Last Height:   Ht Readings from  Last 1 Encounters:  09/06/20 '5\' 4"'$  (1.626 m)     Neuro: Stable, no changes Detailed Neurologic Exam  Speech:    Speech is normal; fluent and spontaneous.  Cognition:    MMSE - Mini Mental State Exam 04/12/2020 06/11/2019  Orientation to time 4 5  Orientation to Place 5 5  Registration 3 3  Attention/ Calculation 2 3  Recall 1 2  Language- name 2 objects 2 2  Language- repeat 1 1  Language- follow 3 step command 3 3  Language- read & follow direction 1 1  Write a sentence 1 0  Copy design 0 0  Total score 23 25   Exam: NAD, pleasant                  Speech:    Speech is normal; fluent and spontaneous with normal comprehension.  Cognition:    The patient is oriented to person, place, and time;     recent and remote memory intact;     language fluent;    Cranial Nerves:    The pupils are equal, round, and reactive to light.Trigeminal sensation is intact and the muscles of mastication are normal. The face is symmetric. The palate elevates in the midline. Hearing intact. Voice is normal. Shoulder shrug is normal. The tongue has normal motion without fasciculations.   Coordination:  No dysmetria  Motor Observation:    No asymmetry, no atrophy, and no involuntary movements noted. Tone:    Normal muscle tone.     Strength:    Strength is V/V in the upper and lower limbs.      Sensation: intact to LT      Strength: Mild prox weakness may be due to poor effort but symmetrical, weak grip, otherwise strength is V/V in the upper and lower limbs.      Sensation: intact to LT     Reflex Exam:  DTR's:    Deep tendon reflexes in the upper and lower extremities are brisk bilaterally,  right >> left Toes:  right upgoing, left downgoing    Clonus:    2 beats clonus    Assessment/Plan:  66 y.o. female here for follow up of vascular dementia and repeated emboic strokes without etiology. 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 Moderate to extensive white matter changes and multiple embolic strokes. Stable.  -  06/23/19 and 06/24/2020: MRI brain showed bilateral acute/subacute and chronic embolic strokes(personally reviewed imaging),  most likely cause is cardio-embolic disease such as atrial fibrillation however loop recorder was negative. At this time unclear why patient keep having embolic phenomenon, will CT Chest/Abd/Pel for cancer screening (can cause hypercoag state) and also order hypercoag panel. Discussed starting anticoagulation at this time they declined will stay on ASA (side effects ot plavix) but would like the workup.   -  we discussed starting a medication possibly Aricept and then going to Namenda, Aricept and Namenda are mostly used for Alzheimer's however there have been studies showing it can help with other types of dementia including vascular dementia. Patient declined any medications.   -  Follow up with Janett Billow for strokes and vascular dementia. 6 months. May continue to follow with NP or be sent back to primary care if stable.   - I had a long d/w patient and husband about stroke, risk for recurrent stroke/TIAs, personally independently reviewed imaging studies and  stroke evaluation results and answered questions.Continue ASA for secondary stroke prevention and maintain strict control of hypertension with blood pressure goal below 130/90, diabetes with hemoglobin A1c goal below 6.5% and lipids with LDL cholesterol goal below 70 mg/dL  - will recheck lipid panel, they want to try diet, last was 108 3 months ago will repeat and see if helping. Discussed Praluent or Repatha(instead of statins they have stoped them) -  Orders Placed This Encounter  Procedures   CT CHEST ABDOMEN PELVIS W CONTRAST   Lipid Panel   Cardiolipin antibodies, IgG, IgM, IgA   Prothrombin gene mutation   Factor V Leiden   Homocysteine   Beta-2-glycoprotein i abs, IgG/M/A   Lupus anticoagulant   Protein S activity   Protein S, total   Protein C, total   Protein C activity   Antithrombin III   Ambulatory referral to Sleep Studies     Cc: Sagardia, Ines Bloomer, *,    Sarina Ill, MD  [facility] [address] [place], [place] [address]  Phone [phone] Fax [phone]  I spent over 40 minutes of face-to-face and non-face-to-face time with patient on the  1. Cancer screening   2. Hypercoagulable state ([place])   3. Cerebrovascular accident (CVA) due to embolism of precerebral artery ([place])   4. Morning headache   5. Witnessed apneic spells     diagnosis.  This included previsit chart review, lab review, study review, order entry, electronic health record documentation, patient education on the different diagnostic and therapeutic options, counseling and coordination of care, risks and benefits of management, compliance, or risk factor reduction

## 2020-09-07 ENCOUNTER — Ambulatory Visit: Payer: Medicare HMO | Attending: Physician Assistant | Admitting: Occupational Therapy

## 2020-09-07 ENCOUNTER — Encounter: Payer: Self-pay | Admitting: Occupational Therapy

## 2020-09-07 ENCOUNTER — Telehealth: Payer: Self-pay | Admitting: Neurology

## 2020-09-07 ENCOUNTER — Ambulatory Visit: Payer: Medicare HMO | Admitting: Physical Therapy

## 2020-09-07 ENCOUNTER — Encounter: Payer: Self-pay | Admitting: Physical Therapy

## 2020-09-07 VITALS — BP 150/85

## 2020-09-07 DIAGNOSIS — R262 Difficulty in walking, not elsewhere classified: Secondary | ICD-10-CM | POA: Insufficient documentation

## 2020-09-07 DIAGNOSIS — R2689 Other abnormalities of gait and mobility: Secondary | ICD-10-CM | POA: Diagnosis not present

## 2020-09-07 DIAGNOSIS — M6281 Muscle weakness (generalized): Secondary | ICD-10-CM

## 2020-09-07 DIAGNOSIS — R41842 Visuospatial deficit: Secondary | ICD-10-CM | POA: Diagnosis not present

## 2020-09-07 DIAGNOSIS — R278 Other lack of coordination: Secondary | ICD-10-CM | POA: Diagnosis not present

## 2020-09-07 DIAGNOSIS — R41844 Frontal lobe and executive function deficit: Secondary | ICD-10-CM | POA: Insufficient documentation

## 2020-09-07 NOTE — Patient Instructions (Addendum)
FLEXION: Standing - Stable: Resistance Band (Active)    Stand with arm at your side and resistance band anchored underfoot. Against resistance band, lift arm forward and up to about chest height, keeping elbow straight. Complete 2 sets of 10-15 repetitions.  EXTENSION: Standing - Resistance Band: Stable (Active)    Stand with arm at your side. Anchor resistance band to a closed door. Against resistance band, draw arm backward keeping elbow straight. Complete 2 sets of 10-15 repetitions.  EXTERNAL ROTATION: Standing - Stable: Exercise Band (Active)    Stand with your elbow bent to 90 against your side, hand forward. Anchor resistance band to a closed door. Against resistance band, rotate forearm outward, keeping elbow at side. Complete 2 sets of 10-15 repetitions.  FLEXION: Standing - Exercise Band: Stable (Active)    Stand, arm at side and resistance band anchored underfoot. Against resistance band, bend elbow, bringing hand toward shoulder. Complete 2 sets of 10-15 repetitions.  Copyright  VHI. All rights reserved.

## 2020-09-07 NOTE — Telephone Encounter (Signed)
Pt's husband Juanda Crumble called inquiring about a comprehensive test. Pt's husband is requesting a call back.

## 2020-09-07 NOTE — Therapy (Signed)
[place] [place], [name], [address] Phone: [phone]   Fax:  [phone]  Physical Therapy Treatment  Patient Details  Name: Jacqueline Bonilla MRN: [id number] Date of Birth: 1955/03/29 Referring Provider (PT): Sagardia   Encounter Date: 09/07/2020   PT End of Session - 09/07/20 1146     Visit Number 8    Date for PT Re-Evaluation 10/12/20    PT Start Time 1100    PT Stop Time 1144    PT Time Calculation (min) 44 min    Equipment Utilized During Treatment Gait belt    Activity Tolerance Patient tolerated treatment well    Behavior During Therapy [facility] for tasks assessed/performed             Past Medical History:  Diagnosis Date   Anemia    Anxiety    Depression    Dysmenorrhea    Endometriosis    Fibroid    Hypertension    Microhematuria    negative workup   Osteoporosis    Tachycardia    Thrombocytopenia ([place])     Past Surgical History:  Procedure Laterality Date   BREAST BIOPSY     CESAREAN SECTION     hysteroscopic resection     implantable loop recorder implant  10/21/2019   Medtronic Reveal [place] model LNQ 22 ([place] [id number]) implantable loop recorder    Vitals:   09/07/20 1102 09/07/20 1107  BP: (!) 155/97 (!) 150/85     Subjective Assessment - 09/07/20 1109     Subjective Went to see [name] yesterday who said it was fine that she discontinue Plavix and now only on baby aspirin.    Pertinent History prior CVA affecting R side, HTN    Patient Stated Goals be more mobile, improve balance,  increase independence    Currently in Pain? No/denies                               [facility] Adult PT Treatment/Exercise - 09/07/20 0001       Neuro Re-ed    Neuro Re-ed Details  1/2 kneel on pad + D2 flexion red TB 10x each with manual cues for proper movement pattern; tall kneeling hip hinge with blue TB 2x10; grapevine with CGA and VC's TC's for proper foot  placement; stepping into 6" box and back 10x each with 1 HHA d/t hesitancy; R/L staggered stance + passing yellow medbakc behind back 10 each- CGA, dropped 3-4 ball times      Knee/Hip Exercises: Aerobic   Recumbent Bike L2.5 x 6 min      Knee/Hip Exercises: Standing   Functional Squat 10 reps;2 sets    Functional Squat Limitations 10x squat to mat + red TB and 7#  at chest, 10x without #   heavy R LE use which improved with cueing                 Upper Extremity Functional Index Score :   /80   PT Education - 09/07/20 1147     Education Details advised patient to continue monitoring BP at home d/t elevated systolic BP today    Person(s) Educated Patient    Methods Explanation;Demonstration;Tactile cues;Verbal cues;Handout    Comprehension Verbalized understanding;Returned demonstration              PT Short Term Goals - 08/24/20 1147  PT SHORT TERM GOAL #1   Title Pt will be I with initial HEP    Time 2    Period Weeks    Status Achieved    Target Date 08/03/20               PT Long Term Goals - 08/03/20 1147       PT LONG TERM GOAL #1   Title Pt will demo improved Berg Balance score to 52/56 to demo decreased fall risk    Baseline 44/56    Time 6    Period Weeks    Status On-going      PT LONG TERM GOAL #2   Title Pt will demo 5x STS <15 seconds with no UE assist    Baseline 17 seconds with occasional UE assist and LEs intermittently locking on back of chair    Time 6    Period Weeks    Status On-going      PT LONG TERM GOAL #3   Title Pt will demo gait >500 ft over level/unlevel surfaces with no AD and supervision assist    Time 6    Period Weeks    Status On-going      PT LONG TERM GOAL #4   Title Pt will report able to safely return to gym routine at [facility]    Time 6    Period Weeks    Status On-going                   Plan - 09/07/20 1148     Clinical Impression Statement Patient without new complaints. BP elevated  at start of session today, however patient asymptomatic, thus proceeded with session while monitoring patient's symptoms. Patient still demonstrates tendency to bias R LE when performing squats, requiring cueing to correct. Provided manual assistance with tall and  kneeling activities for safety and for proper movement pattern. Patient still off balance when performing floor transfers without UE assistance. Encouraged patient to avoid using UE assist with other dynamic balance activities today; limited by hesitancy, thus provided very little HHA with improved comfort. Patient tolerated session well today. Advised patient to continue monitoring BP at home d/t elevated systolic BP today- patient reported understanding.    Comorbidities hx of CVA, HTN    PT Treatment/Interventions ADLs/Self Care Home Management;Gait training;Functional mobility training;Therapeutic activities;Therapeutic exercise;Balance training;Neuromuscular re-education;Manual techniques;Patient/family education;Passive range of motion    PT Next Visit Plan check vitals prior to exercise, review/progress HEP, high level balance ex's, gait training over level/unlevel surfaces, functional LE strengthening, help pt return to [facility] routine safely    Consulted and Agree with Plan of Care Patient             Patient will benefit from skilled therapeutic intervention in order to improve the following deficits and impairments:  Abnormal gait, Difficulty walking, Decreased range of motion, Impaired tone, Decreased endurance, Decreased safety awareness, Decreased activity tolerance, Decreased balance, Decreased mobility, Decreased cognition, Decreased strength  Visit Diagnosis: Balance problem  Difficulty in walking, not elsewhere classified  Other abnormalities of gait and mobility  Muscle weakness (generalized)     Problem List Patient Active Problem List   Diagnosis Date Noted   Dizziness 08/30/2020   History of stroke  07/14/2020   Azotemia 07/11/2020   Anemia 07/11/2020   CVA (cerebral vascular accident) ([place]) 06/28/2020   Cerebral infarction ([place]) 06/25/2020   Acute left-sided weakness 06/24/2020   Vascular dementia with behavior disturbance ([place]) 10/13/2019  Vascular dementia without behavioral disturbance ([place]) 07/15/2019   Cerebrovascular accident ([place]) 06/25/2019   Brain fog 04/22/2019   History of COVID-19 04/22/2019   Essential hypertension 03/03/2019   Hypertensive urgency 03/03/2019    [name], PT, DPT 09/07/20 11:53 AM   [place]. [place], [name], [address] Phone: [phone]   Fax:  [phone]  Name: Jacqueline Bonilla MRN: [id number] Date of Birth: [birth year]

## 2020-09-07 NOTE — Telephone Encounter (Signed)
I returned the patient's husband's call.  He was asking about the lab work that patient is to have completed after fasting.  I let him know the labs were already ordered.  He plans to bring the patient in at 8 AM tomorrow morning when the lab opens and after patient has fasted.  He verbalized appreciation for the call.

## 2020-09-08 ENCOUNTER — Ambulatory Visit (INDEPENDENT_AMBULATORY_CARE_PROVIDER_SITE_OTHER): Payer: Medicare HMO

## 2020-09-08 ENCOUNTER — Telehealth: Payer: Self-pay | Admitting: Neurology

## 2020-09-08 ENCOUNTER — Other Ambulatory Visit: Payer: Self-pay | Admitting: Neurology

## 2020-09-08 ENCOUNTER — Other Ambulatory Visit (INDEPENDENT_AMBULATORY_CARE_PROVIDER_SITE_OTHER): Payer: Self-pay

## 2020-09-08 DIAGNOSIS — I6310 Cerebral infarction due to embolism of unspecified precerebral artery: Secondary | ICD-10-CM

## 2020-09-08 DIAGNOSIS — I639 Cerebral infarction, unspecified: Secondary | ICD-10-CM

## 2020-09-08 DIAGNOSIS — D6859 Other primary thrombophilia: Secondary | ICD-10-CM | POA: Diagnosis not present

## 2020-09-08 DIAGNOSIS — Z0289 Encounter for other administrative examinations: Secondary | ICD-10-CM

## 2020-09-08 NOTE — Telephone Encounter (Signed)
Mcarthur Rossetti Josem Kaufmann: [id number] (exp. 09/08/20 to 10/08/20) order sent to GI. They will reach out to the patient to schedule.

## 2020-09-08 NOTE — Therapy (Signed)
[place]. [place], [name], [address] Phone: [phone]   Fax:  [phone]  Occupational Therapy Treatment  Patient Details  Name: Jacqueline Bonilla MRN: [id number] Date of Birth: 12-31-1955 Referring Provider (OT): Lauraine Rinne, PA-C   Encounter Date: 09/07/2020   OT End of Session - 09/07/20 1023     Visit Number 8    Number of Visits 9    Date for OT Re-Evaluation 10/05/20    Authorization Type [employer]    Authorization - Visit Number 8    Authorization - Number of Visits 9   by 09/16/09   OT Start Time 1018    OT Stop Time 1100    OT Time Calculation (min) 42 min    Activity Tolerance Patient tolerated treatment well    Behavior During Therapy [facility] for tasks assessed/performed            Past Medical History:  Diagnosis Date   Anemia    Anxiety    Depression    Dysmenorrhea    Endometriosis    Fibroid    Hypertension    Microhematuria    negative workup   Osteoporosis    Tachycardia    Thrombocytopenia ([place])     Past Surgical History:  Procedure Laterality Date   BREAST BIOPSY     CESAREAN SECTION     hysteroscopic resection     implantable loop recorder implant  10/21/2019   Medtronic Reveal Linq model LNQ 22 ([place] [id number]) implantable loop recorder    There were no vitals filed for this visit.   Subjective Assessment - 09/07/20 1022     Subjective  Pt states she had her follow-up appt w/ Neuro MD yesterday and that it went well; she will be doing more tests/bloodwork in attempt to figure out potential origin of strokes    Pertinent History "Bilateral scattered infarcts" (per Dr. Erlinda Hong progress note on 06/25/20); PMHx includes history of CVA w/ residual RUE deficits, vascular dementia w/ behavior disturbance, HTN, thrombocytopenia, and ILR    Patient Stated Goals Get strength back in (R) hand; "write better"    Currently in Pain? No/denies             OT  Treatments/Exercises (OP) - 09/07/20 1551       Shoulder Exercises: Standing   External Rotation Strengthening;Right;Left;10 reps    Theraband Level (Shoulder External Rotation) Level 2 (Red)   External Rotation Limitations Pt required tactile and verbal cues to maintain elbow flexion w/ arm adducted to side during exercise    Flexion Strengthening;Right;Left;15 reps    Theraband Level (Shoulder Flexion) Level 2 (Red)    Flexion Limitations Max verbal/tactile cues for pt to maintain elbow extension during exercise    Extension Strengthening;Right;Left;15 reps    Theraband Level (Shoulder Extension) Level 2 (Red)     Elbow Exercises: Standing     Elbow Flexion Strengthening;Right;Left;15 reps w/ red theraband    Therapeutic Activities    Bead Threading Activity Picking up and threading differently sized beads (round, cylindrical, pony) onto string to facilitate [facility], precision pinch, visual perception, and in-hand manipulation. Completed multiple beads w/ R hand (L hand stabilizing string) and w/ L hand (R hand stabilizing); increased success when threading beads w/ L hand         OT Education - 09/07/20 1050     Education Details Reviewed/updated HEP (see pt instructions)    Person(s) Educated Patient    Methods  Explanation;Handout;Demonstration;Tactile cues;Verbal cues    Comprehension Verbalized understanding;Returned demonstration;Verbal cues required             OT Short Term Goals - 08/24/20 1043       OT SHORT TERM GOAL #1   Title Pt will improve bilateral FMC for functional tasks as evidenced by decreasing 9-HPT time by at least 4 sec w/ each hand    Baseline R hand 50 sec; L hand 39 sec    Time 4    Period Weeks    Status On-going   08/24/20 - RUE 48 sec; LUE 39 sec   Target Date 08/12/20      OT SHORT TERM GOAL #2   Title Pt w/ demo independence w/ compensatory strategies or AE prn to improve participation in handwriting    Baseline Reports difficulty w/  handwriting; decreased speed and legibility    Time 4    Period Weeks    Status Achieved   08/17/20 - demo's Ind and reports carryover of learned strategies to home            OT Long Term Goals - 09/07/20 1050       OT LONG TERM GOAL #1   Title Pt will be independent w/ full HEP designed for BUE strengthening, GM/FMC, and coordination by discharge    Baseline No HEP at this time    Time 8    Period Weeks    Status Achieved      OT LONG TERM GOAL #2   Title Pt will improve GM control and coordination for improved participation in ADLs as evidenced by increasing Box and Blocks score by at least 5 blocks w/ both UEs    Baseline R hand 24 blocks; L hand 30 blocks    Time 8    Period Weeks    Status On-going   08/24/20 - RUE 27 blocks; LUE 32 blocks     OT LONG TERM GOAL #3   Title Pt will demonstrate improved visual perceptual skills as evidenced by decreasing Trail Making Test: Part A time by at least 10 seconds    Baseline TMT: Part A 1 min, 58 sec w/ 1 error    Time 8    Period Weeks    Status Achieved   08/31/20 - TMT: A in 1 min, 1 sec            Plan - 09/07/20 1051     Clinical Impression Statement Pt continues to make progress toward goals and anticipate likely d/c next session; pt is agreeable at this time. OT introduced isolated shoulder exercises due to pt's report that she does not currently participate in many UB exercises. Pt was able to complete exercises w/out difficulty and will benefit from review prior to d/c next session due to cognitive deficits. OT also continued to facilitate [facility] w/ bead activity; multiple drops during activity due to weakness w/ natural compensatory pattern indicating increased control w/ L hand > R hand.    OT Occupational Profile and History Detailed Assessment- Review of Records and additional review of physical, cognitive, psychosocial history related to current functional performance    Occupational performance deficits (Please refer  to evaluation for details): ADL's;IADL's;Leisure;Social Participation    Body Structure / Function / Physical Skills ADL;Decreased knowledge of use of DME;Strength;Balance;Dexterity;GMC;Tone;UE functional use;Body mechanics;Endurance;IADL;ROM;Vision;Coordination;Mobility;FMC    Cognitive Skills Memory;Perception;Problem Solve;Safety Awareness;Sequencing    Psychosocial Skills Environmental  Adaptations    Rehab Potential Fair    Clinical  Decision Making Several treatment options, min-mod task modification necessary    Comorbidities Affecting Occupational Performance: Presence of comorbidities impacting occupational performance    Comorbidities impacting occupational performance description: Prior CVA affecting RUE; vascular dementia with behavior disturbance    Modification or Assistance to Complete Evaluation  No modification of tasks or assist necessary to complete eval    OT Frequency 1x / week    OT Duration 8 weeks    OT Treatment/Interventions Self-care/ADL training;DME and/or AE instruction;Balance training;Aquatic Therapy;Therapeutic activities;Therapeutic exercise;Cognitive remediation/compensation;Neuromuscular education;Functional Mobility Training;Visual/perceptual remediation/compensation;Energy conservation;Manual Therapy;Patient/family education    Plan Review HEP and reassess 9-HPT and Box and Blocks in preparation for likely upcoming d/c; balloon tennis?    Consulted and Agree with Plan of Care Patient            Patient will benefit from skilled therapeutic intervention in order to improve the following deficits and impairments:   Body Structure / Function / Physical Skills: ADL, Decreased knowledge of use of DME, Strength, Balance, Dexterity, GMC, Tone, UE functional use, Body mechanics, Endurance, IADL, ROM, Vision, Coordination, Mobility, [facility] Cognitive Skills: Memory, Perception, Problem Solve, Safety Awareness, Sequencing Psychosocial Skills: Environmental   Adaptations   Visit Diagnosis: Muscle weakness (generalized)  Other lack of coordination  Visuospatial deficit  Frontal lobe and executive function deficit   Problem List Patient Active Problem List   Diagnosis Date Noted   Dizziness 08/30/2020   History of stroke 07/14/2020   Azotemia 07/11/2020   Anemia 07/11/2020   CVA (cerebral vascular accident) ([place]) 06/28/2020   Cerebral infarction ([place]) 06/25/2020   Acute left-sided weakness 06/24/2020   Vascular dementia with behavior disturbance ([place]) 10/13/2019   Vascular dementia without behavioral disturbance ([place]) 07/15/2019   Cerebrovascular accident ([place]) 06/25/2019   Brain fog 04/22/2019   History of COVID-19 04/22/2019   Essential hypertension 03/03/2019   Hypertensive urgency 03/03/2019    Kathrine Cords, OTR/L 09/08/2020, 3:57 PM  [place]. [place], [name], [address] Phone: [phone]   Fax:  [phone]  Name: GEORGINE RYEN MRN: [id number] Date of Birth: [birth year]

## 2020-09-09 LAB — CARDIOLIPIN ANTIBODIES, IGG, IGM, IGA
Anticardiolipin IgA: <9 APL U/mL (ref 0–11)
Anticardiolipin IgG: 13 GPL U/mL (ref 0–14)
Anticardiolipin IgM: 29 MPL U/mL — ABNORMAL HIGH (ref 0–12)

## 2020-09-12 NOTE — Telephone Encounter (Signed)
GI informed me that the CT Chest also needs to be approved.  It is pending with [employer] I uploaded clinical notes.

## 2020-09-14 ENCOUNTER — Ambulatory Visit: Payer: Medicare HMO | Admitting: Occupational Therapy

## 2020-09-14 ENCOUNTER — Other Ambulatory Visit: Payer: Self-pay

## 2020-09-14 ENCOUNTER — Encounter: Payer: Self-pay | Admitting: Occupational Therapy

## 2020-09-14 DIAGNOSIS — R262 Difficulty in walking, not elsewhere classified: Secondary | ICD-10-CM | POA: Diagnosis not present

## 2020-09-14 DIAGNOSIS — M6281 Muscle weakness (generalized): Secondary | ICD-10-CM

## 2020-09-14 DIAGNOSIS — R41842 Visuospatial deficit: Secondary | ICD-10-CM

## 2020-09-14 DIAGNOSIS — R41844 Frontal lobe and executive function deficit: Secondary | ICD-10-CM | POA: Diagnosis not present

## 2020-09-14 DIAGNOSIS — R2689 Other abnormalities of gait and mobility: Secondary | ICD-10-CM | POA: Diagnosis not present

## 2020-09-14 DIAGNOSIS — R278 Other lack of coordination: Secondary | ICD-10-CM

## 2020-09-14 LAB — LUPUS ANTICOAGULANT
Dilute Viper Venom Time: 37.4 s (ref 0.0–47.0)
PTT Lupus Anticoagulant: 42.3 s (ref 0.0–51.9)
Thrombin Time: 20.4 s (ref 0.0–23.0)
dPT Confirm Ratio: 0.87 Ratio (ref 0.00–1.34)
dPT: 32.6 s (ref 0.0–47.6)

## 2020-09-14 LAB — PROTEIN S, TOTAL: Protein S Ag, Total: 101 % (ref 60–150)

## 2020-09-14 LAB — BETA-2-GLYCOPROTEIN I ABS, IGG/M/A
Beta-2 Glyco 1 IgA: <9 GPI IgA units (ref 0–25)
Beta-2 Glyco 1 IgM: 27 GPI IgM units (ref 0–32)
Beta-2 Glyco I IgG: <9 GPI IgG units (ref 0–20)

## 2020-09-14 LAB — PROTEIN S ACTIVITY: Protein S Activity: 72 % (ref 63–140)

## 2020-09-14 LAB — HOMOCYSTEINE: Homocysteine: 9.5 umol/L (ref 0.0–17.2)

## 2020-09-14 LAB — FACTOR V LEIDEN

## 2020-09-14 LAB — LIPID PANEL
Chol/HDL Ratio: 3.7 ratio (ref 0.0–4.4)
Cholesterol, Total: 193 mg/dL (ref 100–199)
HDL: 52 mg/dL (ref >39)
LDL Chol Calc (NIH): 127 mg/dL — ABNORMAL HIGH (ref 0–99)
Triglycerides: 77 mg/dL (ref 0–149)
VLDL Cholesterol Cal: 14 mg/dL (ref 5–40)

## 2020-09-14 LAB — ANTITHROMBIN III: AntiThromb III Func: 114 % (ref 75–135)

## 2020-09-14 LAB — PROTEIN C ACTIVITY: Protein C Activity: 125 % (ref 73–180)

## 2020-09-14 LAB — PROTEIN C, TOTAL: Protein C Antigen: 94 % (ref 60–150)

## 2020-09-14 LAB — PROTHROMBIN GENE MUTATION

## 2020-09-14 NOTE — Therapy (Signed)
[place]. [place], [name], [address] Phone: [phone]   Fax:  [phone]  Occupational Therapy Treatment & Discharge Summary  Patient Details  Name: Jacqueline Bonilla MRN: [id number] Date of Birth: 05/03/1955 Referring Provider (OT): Lauraine Rinne, PA-C   Encounter Date: 09/14/2020   OT End of Session - 09/14/20 0850     Visit Number 9    Number of Visits 9    Date for OT Re-Evaluation 10/05/20    Authorization Type [employer]    Authorization - Visit Number 9    Authorization - Number of Visits 9   by 09/16/09   OT Start Time 0847    OT Stop Time 0926    OT Time Calculation (min) 39 min    Activity Tolerance Patient tolerated treatment well    Behavior During Therapy [facility] for tasks assessed/performed            OCCUPATIONAL THERAPY DISCHARGE SUMMARY  Visits from Start of Care: 9  Current functional level related to goals / functional outcomes: Pt is able to complete 9-HPT in 50 sec w/ R hand and 43 sec w/ L hand, transfer 25 blocks w/ RUE and 29 blocks w/ LUE in Box and Blocks assessment, demo's improved participation and understanding of compensatory strategies w/ handwriting, and decreased time to complete Trail Making Test: Part A by 57 seconds   Remaining deficits: Pt continues to exhibit visual perceptual deficits, limitations in cognitive and executive functioning skills, decreased control/coordination, and decreased balance   Education / Equipment: Visual perception compensatory strategies; fall prevention; HEP designed for BUE strengthening and GM/FM coordination  Patient agrees to discharge. Patient goals were partially met. Patient is being discharged due to maximized rehab potential and being pleased with the current functional level.   Past Medical History:  Diagnosis Date   Anemia    Anxiety    Depression    Dysmenorrhea    Endometriosis    Fibroid    Hypertension     Microhematuria    negative workup   Osteoporosis    Tachycardia    Thrombocytopenia [facility])     Past Surgical History:  Procedure Laterality Date   BREAST BIOPSY     CESAREAN SECTION     hysteroscopic resection     implantable loop recorder implant  10/21/2019   Medtronic Reveal Linq model LNQ 22 ([place] [id number]) implantable loop recorder    There were no vitals filed for this visit.   Subjective Assessment - 09/14/20 0849     Subjective  "I think I'm sleeping better"    Pertinent History "Bilateral scattered infarcts" (per Dr. Erlinda Hong progress note on 06/25/20); PMHx includes history of CVA w/ residual RUE deficits, vascular dementia w/ behavior disturbance, HTN, thrombocytopenia, and ILR    Patient Stated Goals Get strength back in (R) hand; "write better"    Currently in Pain? No/denies             Treatment/Exercises - 09/14/20    Light Strengthening Single-arm shoulder flexion, extension, external rotation, and elbow flexion completed 10x w/ each UE using anchored red theraband. Pt able to complete w/out discomfort; OT provided significant cueing for positioning when completing shoulder flexion (e.g., elbow extended, no scaption) and external rotation (e.g., towel roll for UE adduction, tactile cue)    [place] Picking up marbles 2 at a time, translating each to fingertips, and placing on easy-grip pegs to facilitate in-hand manipulation, [place], and visual perception. Completed  w/ each hand and min drops; increased success with decreased speed. To return marbles to container, pt picked up off of pegs individually using 2-point pinch. tossing back and forth, batting in the air with each hand, throwing overhand, kicking with each leg from a short distance, and bouncing/catching off a wall    GMC Completed a series of GM activities w/ a small unweighted ball while standing at CGA to facilitate control, visual perception/tracking, force gradation, and balance: tossing ball back and forth,  batting it in the air with each hand, throwing w/ both hands overhand, kicking with each leg from a short distance, and bouncing/catching off a wall         OT Short Term Goals - 09/14/20 0904       OT SHORT TERM GOAL #1   Title Pt will improve bilateral [place] for functional tasks as evidenced by decreasing 9-HPT time by at least 4 sec w/ each hand    Baseline R hand 50 sec; L hand 39 sec    Time 4    Period Weeks    Status Not Met   09/14/20 - RUE 50 sec, LUE 43 sec; 08/24/20 - RUE 48 sec, LUE 39 sec   Target Date 08/12/20      OT SHORT TERM GOAL #2   Title Pt w/ demo independence w/ compensatory strategies or AE prn to improve participation in handwriting    Baseline Reports difficulty w/ handwriting; decreased speed and legibility    Time 4    Period Weeks    Status Achieved   08/17/20 - demo's Ind and reports carryover of learned strategies to home            OT Long Term Goals - 09/14/20 0905       OT LONG TERM GOAL #1   Title Pt will be independent w/ full HEP designed for BUE strengthening, GM/FMC, and coordination by discharge    Baseline No HEP at this time    Time 8    Period Weeks    Status Achieved      OT LONG TERM GOAL #2   Title Pt will improve GM control and coordination for improved participation in ADLs as evidenced by increasing Box and Blocks score by at least 5 blocks w/ both UEs    Baseline R hand 24 blocks; L hand 30 blocks    Time 8    Period Weeks    Status Not Met   09/14/20 - RUE 25, LUE 29; 08/24/20 - RUE 27, LUE 32     OT LONG TERM GOAL #3   Title Pt will demonstrate improved visual perceptual skills as evidenced by decreasing Trail Making Test: Part A time by at least 10 seconds    Baseline TMT: Part A 1 min, 58 sec w/ 1 error    Time 8    Period Weeks    Status Achieved   08/31/20 - TMT: A in 1 min, 1 sec            Plan - 09/14/20 0851     Clinical Impression Statement [name] is a 65 y/o female who has been seen in OP OT for  decreased coordination and visual perceptual deficits s/p CVA. Pt has shown improvements in understanding/reported carryover of relevant compensatory strategies (e.g., handwriting, visual, coordination) and visual perception and processing w/ 1/2 STGs and 2/3 LTGs met. Pt has not shown improvement in 9-HPT (STG1) or Box and Blocks (LTG2) attempts since  evaluation session; considering pt had residual deficits from prior CVA, lack of improvement may indicate limitations were premorbid. OT reviewed HEP for pt to continue to address and maintain control/coordination at home and facilitated interventions to target both this session. Pt is pleased w/ her current functional level and has maximized rehab potention at this time. She is appropriate for d/c from skilled OT to HEP, reports she is satisfied with progress, and is currently agreeable to discharge plan.    OT Occupational Profile and History Detailed Assessment- Review of Records and additional review of physical, cognitive, psychosocial history related to current functional performance    Occupational performance deficits (Please refer to evaluation for details): ADL's;IADL's;Leisure;Social Participation    Body Structure / Function / Physical Skills ADL;Decreased knowledge of use of DME;Strength;Balance;Dexterity;GMC;Tone;UE functional use;Body mechanics;Endurance;IADL;ROM;Vision;Coordination;Mobility;FMC    Cognitive Skills Memory;Perception;Problem Solve;Safety Awareness;Sequencing    Psychosocial Skills Environmental  Adaptations    Rehab Potential Fair    Clinical Decision Making Several treatment options, min-mod task modification necessary    Comorbidities Affecting Occupational Performance: Presence of comorbidities impacting occupational performance    Comorbidities impacting occupational performance description: Prior CVA affecting RUE; vascular dementia with behavior disturbance    Modification or Assistance to Complete Evaluation  No  modification of tasks or assist necessary to complete eval    OT Frequency 1x / week    OT Duration 8 weeks    OT Treatment/Interventions Self-care/ADL training;DME and/or AE instruction;Balance training;Aquatic Therapy;Therapeutic activities;Therapeutic exercise;Cognitive remediation/compensation;Neuromuscular education;Functional Mobility Training;Visual/perceptual remediation/compensation;Energy conservation;Manual Therapy;Patient/family education    Plan D/C    Consulted and Agree with Plan of Care Patient            Patient will benefit from skilled therapeutic intervention in order to improve the following deficits and impairments:   Body Structure / Function / Physical Skills: ADL, Decreased knowledge of use of DME, Strength, Balance, Dexterity, GMC, Tone, UE functional use, Body mechanics, Endurance, IADL, ROM, Vision, Coordination, Mobility, [facility] Cognitive Skills: Memory, Perception, Problem Solve, Safety Awareness, Sequencing Psychosocial Skills: Environmental  Adaptations   Visit Diagnosis: Other lack of coordination  Muscle weakness (generalized)  Visuospatial deficit  Frontal lobe and executive function deficit   Problem List Patient Active Problem List   Diagnosis Date Noted   Dizziness 08/30/2020   History of stroke 07/14/2020   Azotemia 07/11/2020   Anemia 07/11/2020   CVA (cerebral vascular accident) ([place]) 06/28/2020   Cerebral infarction ([place]) 06/25/2020   Acute left-sided weakness 06/24/2020   Vascular dementia with behavior disturbance ([place]) 10/13/2019   Vascular dementia without behavioral disturbance ([place]) 07/15/2019   Cerebrovascular accident ([place]) 06/25/2019   Brain fog 04/22/2019   History of COVID-19 04/22/2019   Essential hypertension 03/03/2019   Hypertensive urgency 03/03/2019    Kathrine Cords, OTR/L, MSOT 09/14/2020, 9:16 AM  [place]. [place], [name], [address] Phone:  [phone]   Fax:  [phone]  Name: AALA RANSOM MRN: [id number] Date of Birth: April 22, 1955

## 2020-09-16 LAB — CUP PACEART REMOTE DEVICE CHECK
Date Time Interrogation Session: 20220915190654
Implantable Pulse Generator Implant Date: 20211020

## 2020-09-19 ENCOUNTER — Other Ambulatory Visit: Payer: Self-pay | Admitting: Emergency Medicine

## 2020-09-19 DIAGNOSIS — E785 Hyperlipidemia, unspecified: Secondary | ICD-10-CM

## 2020-09-19 MED ORDER — ROSUVASTATIN CALCIUM 10 MG PO TABS: 10.0000 mg | ORAL_TABLET | Freq: Every day | ORAL | 3 refills | Status: DC

## 2020-09-19 NOTE — Telephone Encounter (Signed)
The Ct Chest is still pending with [employer].

## 2020-09-19 NOTE — Progress Notes (Signed)
Thank you, [name].  Hope you are doing well. I agree with the statin but [name] has been reluctant to take it.  I will send a prescription for rosuvastatin 10 mg daily anyways. Thank you for all your help. Jacqueline Bonilla

## 2020-09-19 NOTE — Progress Notes (Signed)
Carelink Summary Report / Loop Recorder 

## 2020-09-20 ENCOUNTER — Ambulatory Visit: Payer: Medicare HMO | Admitting: Physical Medicine & Rehabilitation

## 2020-09-20 ENCOUNTER — Telehealth: Payer: Self-pay | Admitting: *Deleted

## 2020-09-20 NOTE — Telephone Encounter (Signed)
[employer] auth for CT Chest auth: [id number] (exp. 09/12/20 to 10/122/22) order sent to GI. They will reach out to the patient to schedule.

## 2020-09-20 NOTE — Telephone Encounter (Signed)
I spoke with the patient's husband and advised that we are still waiting on a lab result but so far pt's blood work looks good.  [name] states there is no clotting disorder.  The patient's LDL is 127 and she would like to see it below 70.  She recommends a statin.  Lab results were sent over to Dr. Mitchel Honour and he went ahead and ordered Rosuvastatin 10 mg daily for the patient. Patient's husband verbalized understanding and appreciation for the call.  He did not have any questions.

## 2020-09-20 NOTE — Telephone Encounter (Signed)
-----   Message from [name], MD sent at 09/19/2020  8:10 AM EDT ----- Please call patient and relay the following. I sent it through mychart but it has remained unread: Blood work all normal(abnormal clotting panel) except elevated bad cholesterol 127 I would like it < 70 please work with [facility] and I recommend a statin and am happy to prescribe if you like thanks. (Dr. Theadore Nan)

## 2020-10-17 ENCOUNTER — Telehealth: Payer: Self-pay

## 2020-10-17 MED ORDER — PREDNISONE 50 MG PO TABS: ORAL_TABLET | ORAL | 0 refills | Status: DC

## 2020-10-17 MED ORDER — DIPHENHYDRAMINE HCL 50 MG PO TABS: 50.0000 mg | ORAL_TABLET | Freq: Once | ORAL | 0 refills | Status: DC

## 2020-10-17 NOTE — Telephone Encounter (Signed)
Phone call to patient to review instructions for 13 hr prep for CT w/ contrast on 10/20/20 AT 12:40. Spoke to patients spouse, who is patients primary caregiver. The patients spouse did re-call patients allergy to CT contrast and the she required medications after but could not report if she has had a CT with contrast without a 13 hr prep since and did okay. Therefore, a 13 hr prep will be called in for this patient. Prescription called into [facility]. Pt/cg aware and verbalized understanding of instructions.  Prescription: Pt to take 50 mg of prednisone on 10/19/20 at 11:40 PM, 50 mg of prednisone on 10/20/20 at 5:40 AM, and 50 mg of prednisone on 10/20/20 at 11:40 AM. Pt is also to take 50 mg of benadryl on 10/20/20 at 11:40 AM. Please call [phone] with any questions.    Benadryl was also called in as a separate prescription, per the caregivers request.

## 2020-10-18 ENCOUNTER — Ambulatory Visit: Payer: Medicare HMO | Admitting: Physical Therapy

## 2020-10-19 ENCOUNTER — Ambulatory Visit: Payer: Medicare HMO | Attending: Physician Assistant | Admitting: Physical Therapy

## 2020-10-19 ENCOUNTER — Ambulatory Visit (INDEPENDENT_AMBULATORY_CARE_PROVIDER_SITE_OTHER): Payer: Medicare HMO

## 2020-10-19 ENCOUNTER — Other Ambulatory Visit: Payer: Self-pay

## 2020-10-19 DIAGNOSIS — R2689 Other abnormalities of gait and mobility: Secondary | ICD-10-CM | POA: Diagnosis not present

## 2020-10-19 DIAGNOSIS — R262 Difficulty in walking, not elsewhere classified: Secondary | ICD-10-CM | POA: Diagnosis not present

## 2020-10-19 DIAGNOSIS — R278 Other lack of coordination: Secondary | ICD-10-CM | POA: Insufficient documentation

## 2020-10-19 DIAGNOSIS — M6281 Muscle weakness (generalized): Secondary | ICD-10-CM | POA: Insufficient documentation

## 2020-10-19 DIAGNOSIS — I63321 Cerebral infarction due to thrombosis of right anterior cerebral artery: Secondary | ICD-10-CM | POA: Diagnosis not present

## 2020-10-19 NOTE — Therapy (Signed)
[address] [place], [name], [address] Phone: [phone]   Fax:  [phone]  Physical Therapy Treatment and Discharge  Patient Details  Name: Jacqueline Bonilla MRN: [id number] Date of Birth: 1955/04/03 Referring Provider (PT): [place]  Visits from Start of Care: 9  Current functional level related to goals / functional outcomes: See below   Remaining deficits: See below   Education / Equipment: See below   Patient agrees to discharge. Patient goals were met. Patient is being discharged due to meeting the stated rehab goals.   Encounter Date: 10/19/2020   PT End of Session - 10/19/20 1424     Visit Number 9    Date for PT Re-Evaluation 10/12/20    PT Start Time 0762    PT Stop Time 1445    PT Time Calculation (min) 40 min    Equipment Utilized During Treatment Gait belt    Activity Tolerance Patient tolerated treatment well    Behavior During Therapy WFL for tasks assessed/performed             Past Medical History:  Diagnosis Date   Anemia    Anxiety    Depression    Dysmenorrhea    Endometriosis    Fibroid    Hypertension    Microhematuria    negative workup   Osteoporosis    Tachycardia    Thrombocytopenia (HCC)     Past Surgical History:  Procedure Laterality Date   BREAST BIOPSY     CESAREAN SECTION     hysteroscopic resection     implantable loop recorder implant  10/21/2019   Medtronic Reveal Linq model LNQ 22 (SN [id number]) implantable loop recorder    There were no vitals filed for this visit.   Subjective Assessment - 10/19/20 1406     Subject[facility], leg press, free weights). Reports no issues with this. She has been doing her exercises daily.    Pertinent History prior CVA affecting R side, HTN    Patient Stated Goals be more mobile, improve balance,  increase independence                 [facility] PT Assessment - 10/19/20 0001       Berg Balance Test   Sit to Stand Able to stand without using hands and stabilize independently    Standing Unsupported Able to stand safely 2 minutes    Sitting with Back Unsupported but Feet Supported on Floor or Stool Able to sit safely and securely 2 minutes    Stand to Sit Sits safely with minimal use of hands    Transfers Able to transfer safely, minor use of hands    Standing Unsupported with Eyes Closed Able to stand 10 seconds with supervision    Standing Unsupported with Feet Together Able to place feet together independently and stand 1 minute safely    From Standing, Reach Forward with Outstretched Arm Can reach forward >12 cm safely (5")    From Standing Position, Pick up Object from Floor Able to pick up shoe safely and easily    From Standing Position, Turn to Look Behind Over each Shoulder Looks behind from both sides and weight shifts well    Turn 360 Degrees Able to turn 360 degrees safely in 4 seconds or less    Standing Unsupported, Alternately Place Feet on Step/Stool Able to stand independently  and safely and complete 8 steps in 20 seconds    Standing Unsupported, One Foot in [place] to place foot tandem independently and hold 30 seconds    Standing on One Leg Able to lift leg independently and hold > 10 seconds    Total Score 54    Berg comment: 54/56                                    PT Education - 10/19/20 1817     Education Details Discussed how to advance her exercises independently. Provided final HEP.    Person(s) Educated Patient    Methods Explanation;Demonstration;Tactile cues;Verbal cues;Handout    Comprehension Verbalized understanding;Returned demonstration              PT Short Term Goals - 08/24/20 1147       PT SHORT TERM GOAL #1   Title Pt will be I with initial HEP    Time 2    Period Weeks    Status Achieved    Target Date 08/03/20                PT Long Term Goals - 10/19/20 1416       PT LONG TERM GOAL #1   Title Pt will demo improved Berg Balance score to 52/56 to demo decreased fall risk    Baseline 44/56    Time 6    Period Weeks    Status Achieved      PT LONG TERM GOAL #2   Title Pt will demo 5x STS <15 seconds with no UE assist    Baseline 17 seconds with occasional UE assist and LEs intermittently locking on back of chair    Time 6    Period Weeks    Status Achieved      PT LONG TERM GOAL #3   Title Pt will demo gait >500 ft over level/unlevel surfaces with no AD and supervision assist    Time 6    Period Weeks    Status Achieved      PT LONG TERM GOAL #4   Title Pt will report able to safely return to gym routine at [facility]    Time [place] - 10/19/20 1817     Clinical Impression Statement Pt has met all of her LTGs; however, Pt continues to have some mild incoordination and delayed L LE response with quick movements (i.e. light plyometrics). Slight bias towards R LE with sit to stands; updated HEP with staggered stance sit to stands and single leg press. Discussed how to progress her mobility and home exercises with good understanding. Pt is demonstrating great improvements overall. Pt is ready for d/c and feels she is near her baseline.    Personal Factors and Comorbidities Comorbidity 2    Comorbidities hx of CVA, HTN    Examination-Activity Limitations Stand;Locomotion Level    Examination-Participation Restrictions Community Activity;Interpersonal Relationship    PT Treatment/Interventions ADLs/Self Care Home Management;Gait training;Functional mobility training;Therapeutic activities;Therapeutic exercise;Balance training;Neuromuscular re-education;Manual techniques;Patient/family education;Passive range of motion    PT Next Visit Plan high level balance    PT Home Exercise Plan Access Code: [email or web address]    Consulted and Agree with Plan of  Care Patient  Patient will benefit from skilled therapeutic intervention in order to improve the following deficits and impairments:  Abnormal gait, Difficulty walking, Decreased range of motion, Impaired tone, Decreased endurance, Decreased safety awareness, Decreased activity tolerance, Decreased balance, Decreased mobility, Decreased cognition, Decreased strength  Visit Diagnosis: Other lack of coordination  Muscle weakness (generalized)  Balance problem  Difficulty in walking, not elsewhere classified  Other abnormalities of gait and mobility     Problem List Patient Active Problem List   Diagnosis Date Noted   Dizziness 08/30/2020   History of stroke 07/14/2020   Azotemia 07/11/2020   Anemia 07/11/2020   CVA (cerebral vascular accident) ([place]) 06/28/2020   Cerebral infarction ([place]) 06/25/2020   Acute left-sided weakness 06/24/2020   Vascular dementia with behavior disturbance 10/13/2019   Vascular dementia without behavioral disturbance ([place]) 07/15/2019   Cerebrovascular accident ([place]) 06/25/2019   Brain fog 04/22/2019   History of COVID-19 04/22/2019   Essential hypertension 03/03/2019   Hypertensive urgency 03/03/2019    [facility] April Gordy Levan, PT, DPT 10/19/2020, 6:22 PM  [place]. [place], [name], [address] Phone: [phone]   Fax:  [phone]  Name: CERRIA RANDHAWA MRN: [id number] Date of Birth: 10/12/1955

## 2020-10-19 NOTE — Patient Instructions (Signed)
Access Code: [email or web address] URL: [email or web address] Date: 10/19/2020 Prepared by: Estill Bamberg April Thurnell Garbe  Exercises Single Leg Press - 1 x daily - 7 x weekly - 2 sets - 10 reps Side Straddle Hop with Agility Ladder - 1 x daily - 7 x weekly - 2 sets - 10 reps Braided Sidestepping - 1 x daily - 7 x weekly - 2 sets - 10 reps Staggered Sit-to-Stand - 1 x daily - 7 x weekly - 2 sets - 10 reps Standing Balance with Eyes Closed on Foam - 1 x daily - 7 x weekly - 2 sets - 30 sec hold Wide Tandem Stance with Eyes Open - 1 x daily - 7 x weekly - 3 sets - 10 reps

## 2020-10-20 ENCOUNTER — Ambulatory Visit
Admission: RE | Admit: 2020-10-20 | Discharge: 2020-10-20 | Disposition: A | Discharge status: Home / Self Care | Payer: Medicare HMO | Source: Ambulatory Visit | Attending: Neurology | Admitting: Neurology

## 2020-10-20 DIAGNOSIS — J9811 Atelectasis: Secondary | ICD-10-CM | POA: Diagnosis not present

## 2020-10-20 DIAGNOSIS — Z129 Encounter for screening for malignant neoplasm, site unspecified: Secondary | ICD-10-CM

## 2020-10-20 DIAGNOSIS — K5641 Fecal impaction: Secondary | ICD-10-CM | POA: Diagnosis not present

## 2020-10-20 DIAGNOSIS — I639 Cerebral infarction, unspecified: Secondary | ICD-10-CM | POA: Diagnosis not present

## 2020-10-20 DIAGNOSIS — I6310 Cerebral infarction due to embolism of unspecified precerebral artery: Secondary | ICD-10-CM

## 2020-10-20 DIAGNOSIS — I700 Atherosclerosis of aorta: Secondary | ICD-10-CM | POA: Diagnosis not present

## 2020-10-20 DIAGNOSIS — I2584 Coronary atherosclerosis due to calcified coronary lesion: Secondary | ICD-10-CM | POA: Diagnosis not present

## 2020-10-20 DIAGNOSIS — K449 Diaphragmatic hernia without obstruction or gangrene: Secondary | ICD-10-CM | POA: Diagnosis not present

## 2020-10-20 DIAGNOSIS — D6859 Other primary thrombophilia: Secondary | ICD-10-CM

## 2020-10-20 IMAGING — CT CT CHEST-ABD-PELV W/ CM
2 series · 12 of 32 positions shown, 17 images · IV contrast (APPLIED)
Comparison: Chest radiograph [DATE]

CLINICAL DATA: Cancer of unknown primary, staging, cancer screen.
Hypercoagulable state with embolic stroke.

EXAM:
CT CHEST, ABDOMEN, AND PELVIS WITH CONTRAST
TECHNIQUE: Multidetector CT imaging of the chest, abdomen and pelvis was
performed following the standard protocol during bolus
administration of intravenous contrast.
CONTRAST:  100mL [YQ] IOPAMIDOL ([YQ]) INJECTION 61%
Creatinine was obtained on site at [HOSPITAL] at [HOSPITAL].
Results: Creatinine 0.9 mg/dL.

[Series 2: chest/abd/pelvis w/cm · axial · 0.74mm/px · z∈[-501,-81]mm · 7 of 119 slices shown]
[im 10/119  soft-tissue]
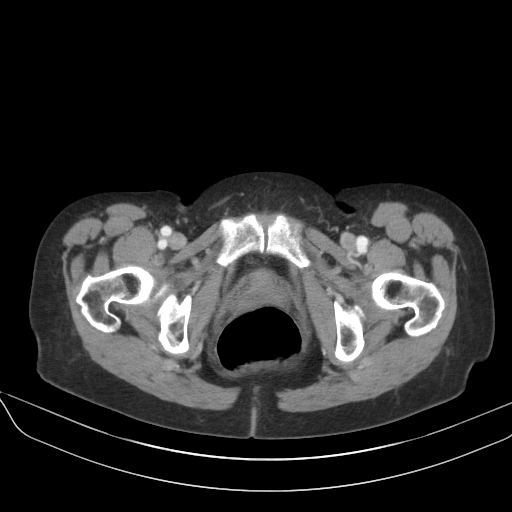
[im 25/119  soft-tissue]
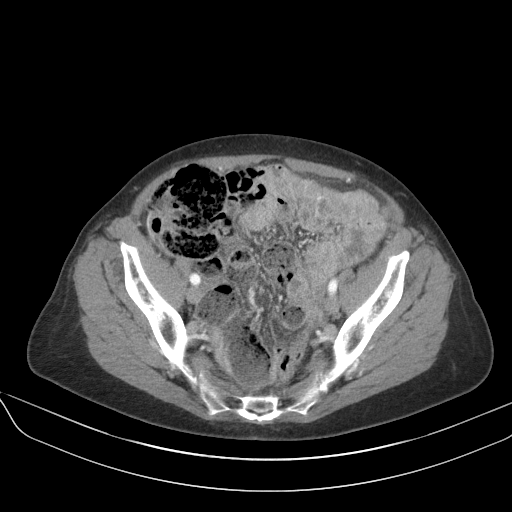
[im 40/119  soft-tissue]
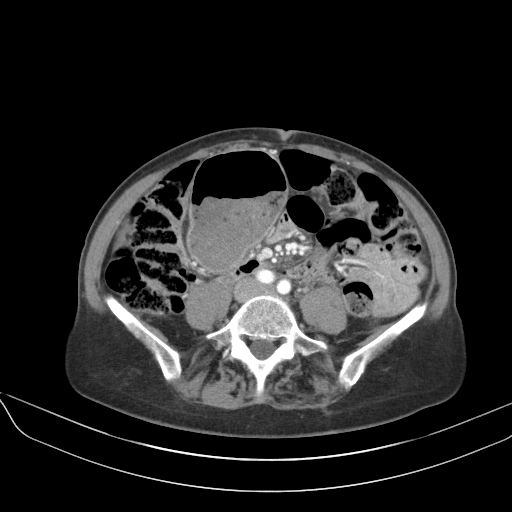
[im 55/119  soft-tissue]
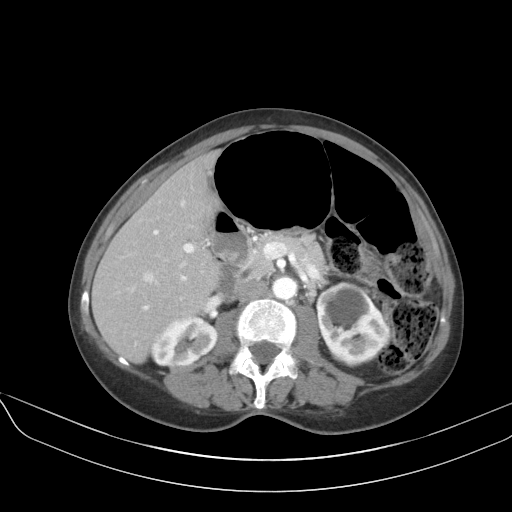
[im 64/119  soft-tissue]
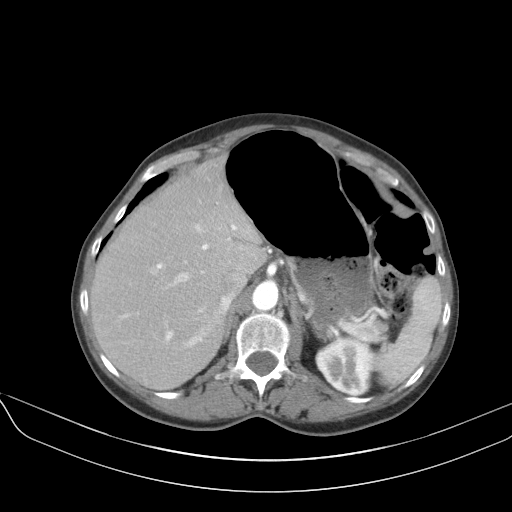
[im 79/119  soft-tissue]
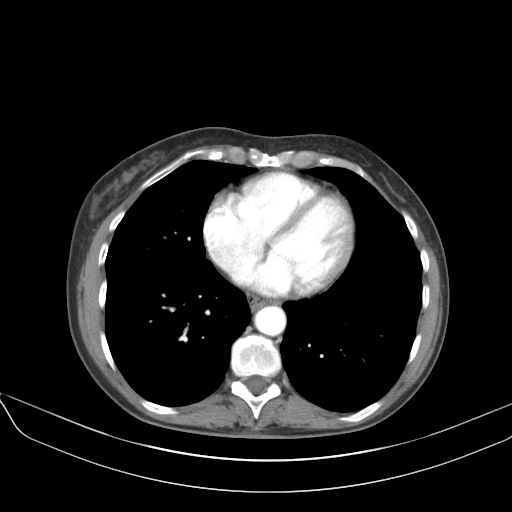
[im 94/119  soft-tissue]
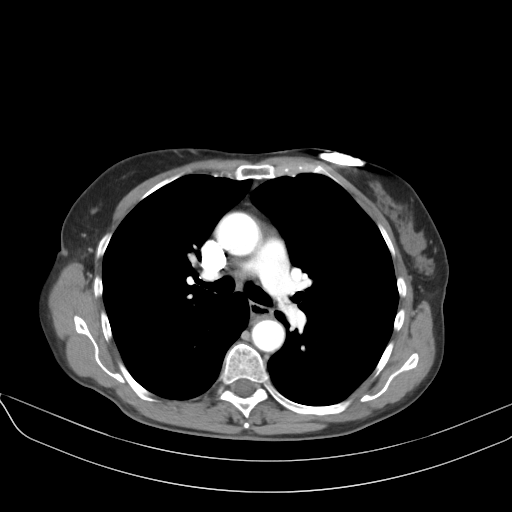

[Series 3: renal delay · axial · delayed · 0.74mm/px · z∈[-350,-240]mm · 5 of 34 slices shown, 10 images]
[im 6/34  soft-tissue]
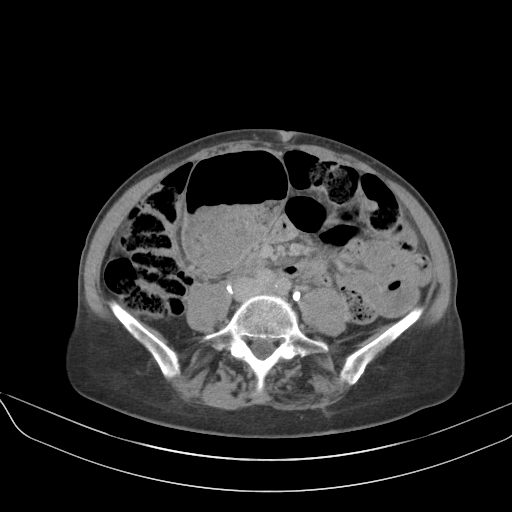
[im 6/34  bone]
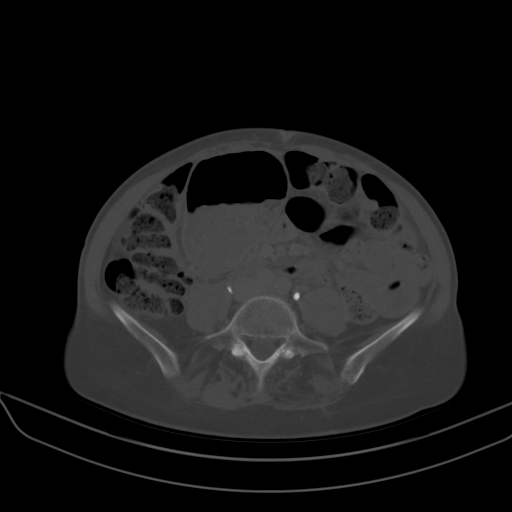
[im 12/34  soft-tissue]
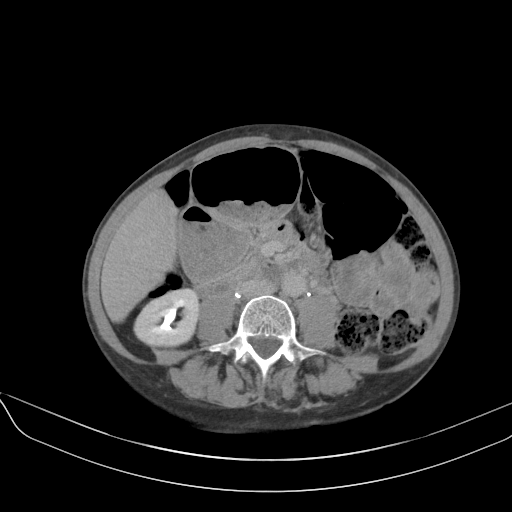
[im 12/34  lung]
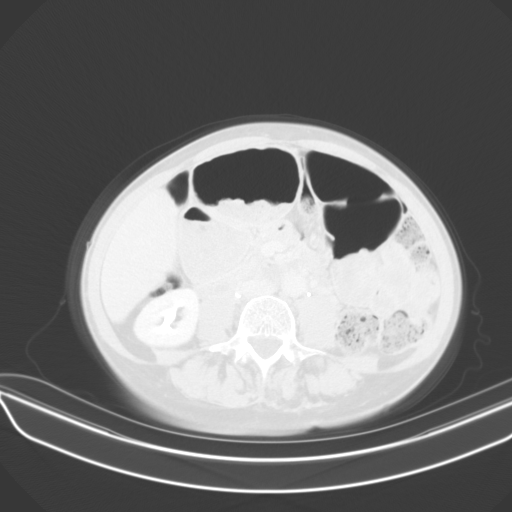
[im 17/34  soft-tissue]
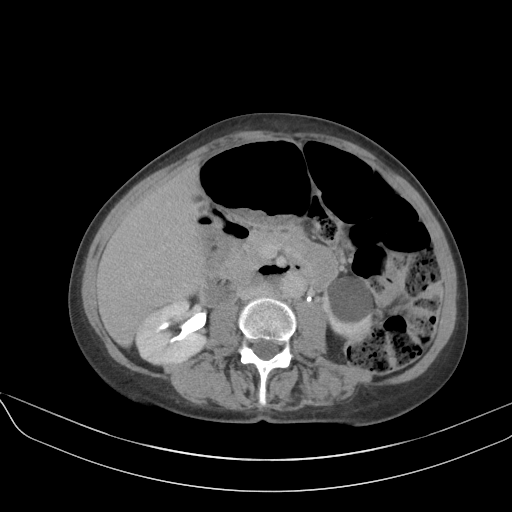
[im 17/34  lung]
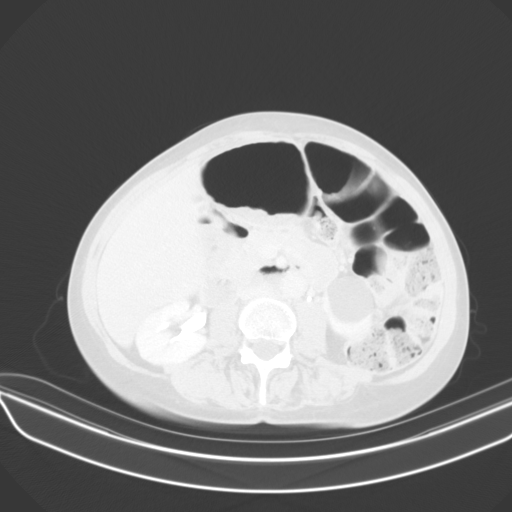
[im 23/34  soft-tissue]
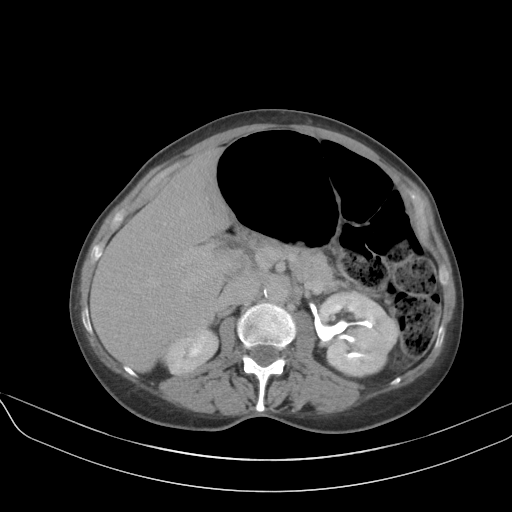
[im 23/34  lung]
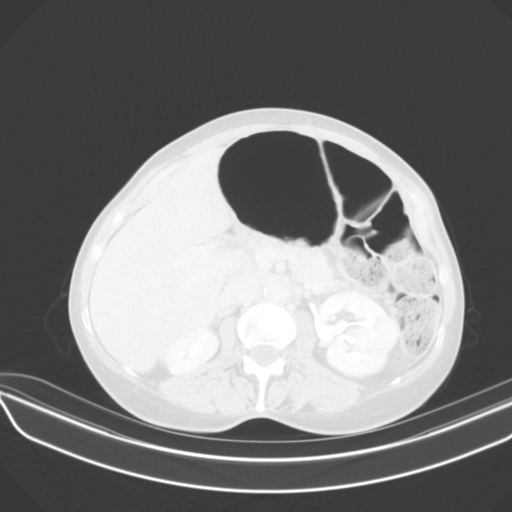
[im 28/34  soft-tissue]
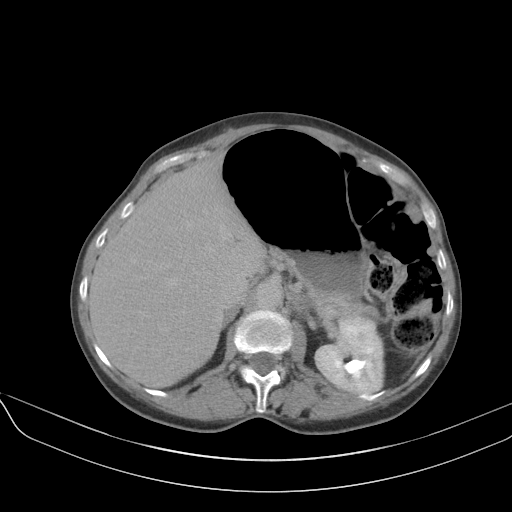
[im 28/34  lung]
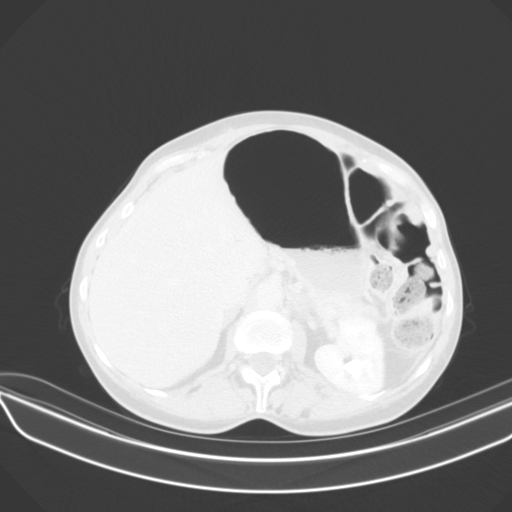

[12 of 32 positions shown; findings below may reference images not displayed]

FINDINGS: CT CHEST FINDINGS

Cardiovascular: Mild aortic atherosclerosis. No aortic aneurysm or
acute aortic findings. The heart is normal in size. There are
coronary artery calcifications. There are no filling defects in the
central pulmonary arteries on this non dedicated CTA exam. No
pericardial effusion.

Mediastinum/Nodes: No mediastinal mass. No enlarged mediastinal,
hilar, or axillary lymph nodes. Esophagus mildly patulous with tiny
hiatal hernia. Subcentimeter thyroid nodules not meeting consensus
criteria for ultrasound follow-up.

Lungs/Pleura: Minor dependent atelectasis in the right greater than
left lower lobe. No confluent consolidation. No pulmonary nodule or
mass. No pleural fluid. Trachea and central bronchi are patent.

Musculoskeletal: Implanted loop recorder in the left chest wall. No
focal bone lesion or acute osseous findings. No chest wall soft
tissue abnormalities. There is a remote left lateral rib fracture.

CT ABDOMEN PELVIS FINDINGS

Hepatobiliary: 15 mm subcapsular water density lesion in the right
lobe of the liver in typical of cyst or hemangioma. No other liver
lesions are seen. Gallbladder is nondistended. No calcified
gallstone. There is no biliary dilatation.

Pancreas: No pancreatic mass, ductal dilatation or peripancreatic
fat stranding.

Spleen: Normal in size without focal abnormality.

Adrenals/Urinary Tract: No adrenal nodule. 3.2 cm simple cyst in the
lower left kidney. There is slight cortical scarring in the
posterior right kidney. No hydronephrosis or solid renal lesion.
Homogeneous renal enhancement with symmetric excretion on delayed
phase imaging. Urinary bladder is partially distended, no bladder
wall thickening or evidence of mass.

Stomach/Bowel: Detailed bowel assessment is limited in the absence
of enteric contrast. Small hiatal hernia with mild wall thickening
at the gastroesophageal junction. There is gastric distension with
air and digested material. No evidence of gastric wall thickening,
mass, or gastric outlet obstruction. Occasional fecalization of
small bowel contents. No bowel obstruction or inflammatory change.
The appendix is tentatively visualized and normal. Moderate volume
of stool throughout the colon. No evident colonic mass or wall
thickening. No abnormal rectal distention.

Vascular/Lymphatic: Normal caliber abdominal aorta with mild
atherosclerosis. Patent portal vein. No bulky abdominopelvic
adenopathy.

Reproductive: Normal for age uterine atrophy. Small uterine
calcifications are typically calcified fibroids. No adnexal mass.

Other: No ascites. No omental thickening. No abdominal wall hernia.

Musculoskeletal: No focal bone lesion or acute osseous abnormality.
Abdominal soft tissues are unremarkable.
IMPRESSION: 1. No evidence of malignancy or metastatic disease in the chest,
abdomen, or pelvis.
2. Small hiatal hernia with mild wall thickening at the
gastroesophageal junction, suggesting reflux or esophagitis. Stomach
is distended with air and ingested contents. Recommend correlation
for any signs and symptoms of gastroparesis.
3. Moderate colonic stool burden with fecalization of small bowel
contents suggesting slow transit/constipation.

Aortic Atherosclerosis ([YQ]-[YQ]).

## 2020-10-20 MED ORDER — IOPAMIDOL (ISOVUE-300) INJECTION 61%
100.0000 mL | Freq: Once | INTRAVENOUS | Status: AC | PRN
  Administered 2020-10-20: 100 mL via INTRAVENOUS

## 2020-10-21 LAB — CUP PACEART REMOTE DEVICE CHECK
Date Time Interrogation Session: 20221018190621
Implantable Pulse Generator Implant Date: 20211020

## 2020-10-24 NOTE — Progress Notes (Signed)
Thank you [name], I will follow up on these. [place]

## 2020-10-24 NOTE — Progress Notes (Signed)
Carelink Summary Report / Loop Recorder 

## 2020-10-27 ENCOUNTER — Encounter: Payer: Medicare HMO | Attending: Physical Medicine & Rehabilitation | Admitting: Physical Medicine & Rehabilitation

## 2020-10-27 ENCOUNTER — Other Ambulatory Visit: Payer: Self-pay

## 2020-10-27 ENCOUNTER — Encounter: Payer: Self-pay | Admitting: Physical Medicine & Rehabilitation

## 2020-10-27 VITALS — BP 131/78 | HR 96 | Ht 64.0 in | Wt 118.0 lb

## 2020-10-27 DIAGNOSIS — I69398 Other sequelae of cerebral infarction: Secondary | ICD-10-CM | POA: Diagnosis not present

## 2020-10-27 DIAGNOSIS — R269 Unspecified abnormalities of gait and mobility: Secondary | ICD-10-CM | POA: Insufficient documentation

## 2020-10-27 NOTE — Progress Notes (Signed)
Subjective:    Patient ID: Jacqueline Bonilla, female    DOB: 1955/11/29, 65 y.o.   MRN: [id number] 65 y.o. 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  She had waxing and waning per EMS but refused tPA.  MRI/MRA brain done revealing punctate infarct and high "report except faint areas of DWI signal in bilateral precentral gyri question acute/subacute infarct or artifact.  Stroke felt to be embolic recorder was interrogated with negative for A. fib.  Dr. Erlinda Hong recommended aspirin/Plavix x3 weeks followed by Plavix alone.  Patient continued to be limited by left-sided weakness with apraxia as well as premorbid mild cognitive deficits with moderate word finding issues HPI  Appreciate Neuro f/u on 09/06/2020 Dressing and bathing Mod I Husband is cooking No falls Walked 4 miles at [place]/c [place]  Would like to return to driving, she was driving prior to stroke in June 2022 Practicing driving in empty lots  Reviewed neurology note from 09/06/2020, seen for vascular dementia, patient declined medications for this Pain Inventory Average Pain 0 Pain Right Now 0 My pain is constant and numbness  LOCATION OF PAIN  right hand  BOWEL Number of stools per week: 7 Oral laxative use  Fiber Gummies   BLADDER Normal  Mobility walk without assistance ability to climb steps?  yes do you drive?  no Do you have any goals in this area?  yes  Function retired Do you have any goals in this area?  yes  Neuro/Psych numbness trouble walking  Prior Studies Any changes since last visit?  yes CT/MRI [place]  Physicians involved in your care Any changes since last visit?  no   Family History  Problem Relation Age of Onset   Cancer Father         non hodgkin lymphoma & skin   Heart attack Maternal Grandfather    Dementia Mother    Polymyositis Sister    Social History   Socioeconomic History   Marital status: Married    Spouse name: Not on file   Number of children: 1   Years of education: Not on file   Highest education level: Not on file  Occupational History   Occupation: accounting  Tobacco Use   Smoking status: Never   Smokeless tobacco: Never  Vaping Use   Vaping Use: Never used  Substance and Sexual Activity   Alcohol use: Never   Drug use: Never   Sexual activity: Not Currently    Partners: Male    Comment: husband vasectomy  Other Topics Concern   Not on file  Social History Narrative   Lives with husband   Grandchildren - 1   Works - Investment banker, 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%   Gun in home - yes - secured      Right handed   Caffeine: maybe tea every now and then   Social Determinants of Radio broadcast assistant Strain: Low Risk    Difficulty of Paying Living Expenses: Not hard at all  Food Insecurity: No Food Insecurity   Worried About Charity fundraiser in the Last Year: Never true   Arboriculturist in the Last Year: Never true  Transportation Needs: No Transportation Needs   Lack  of Transportation (Medical): No   Lack of Transportation (Non-Medical): No  Physical Activity: Sufficiently Active   Days of Exercise per Week: 5 days   Minutes of Exercise per Session: 30 min  Stress: No Stress Concern Present   Feeling of Stress : Not at all  Social Connections: Not on file   Past Surgical History:  Procedure Laterality Date   BREAST BIOPSY     CESAREAN SECTION     hysteroscopic resection     implantable loop recorder implant  10/21/2019   Medtronic Reveal Linq model LNQ 22 ([place] [id number]) implantable loop recorder   Past Medical History:  Diagnosis Date   Anemia    Anxiety    Depression    Dysmenorrhea    Endometriosis    Fibroid    Hypertension    Microhematuria    negative workup    Osteoporosis    Tachycardia    Thrombocytopenia (HCC)    Ht 5\' 4"  (1.626 m)   Wt 118 lb (53.5 kg)   LMP 01/02/2003   BMI 20.25 kg/m   Opioid Risk Score:   Fall Risk Score:  `1  Depression screen PHQ 2/9  Depression screen [facility] 2/9 08/25/2020 07/20/2020 07/14/2020 01/18/2020 12/03/2019 07/20/2019 07/15/2019  Decreased Interest 0 0 0 0 0 0 0  Down, Depressed, Hopeless 0 0 0 0 0 0 0  PHQ - 2 Score 0 0 0 0 0 0 0  Altered sleeping - 0 - - - - -  Tired, decreased energy - 0 - - - - -  Change in appetite - 0 - - - - -  Feeling bad or failure about yourself  - 0 - - - - -  Trouble concentrating - 0 - - - - -  Moving slowly or fidgety/restless - 0 - - - - -  Suicidal thoughts - 0 - - - - -  PHQ-9 Score - 0 - - - - -    Review of Systems  Musculoskeletal:  Positive for gait problem.  Neurological:  Positive for numbness (numbness in finger on right hand).  All other systems reviewed and are negative.     Objective:   Physical Exam Vitals and nursing note reviewed.  Constitutional:      Appearance: She is normal weight.  HENT:     Head: Normocephalic and atraumatic.  Eyes:     Extraocular Movements: Extraocular movements intact.     Conjunctiva/sclera: Conjunctivae normal.     Pupils: Pupils are equal, round, and reactive to light.  Musculoskeletal:     Comments: Patient  full and pain-free range of motion cervical spine upper and lower limbs  Skin:    General: Skin is warm and dry.  Neurological:     Mental Status: She is alert and oriented to person, place, and time.     Cranial Nerves: Facial asymmetry present. No dysarthria.     Coordination: Impaired rapid alternating movements.     Gait: Gait is intact.     Comments: Motor strength is 5/5 bilateral deltoid, bicep, tricep, grip, hip flexor, knee extensor, ankle dorsiflexor and plantar flexor  Psychiatric:        Mood and Affect: Mood normal.        Behavior: Behavior normal.   Has difficulty with tandem gait Oriented  x3      Assessment & Plan:   1.  History of recurrent stroke with residual gait disorder, has history of vascular disease dementia but is functional with  activities of daily living and does not require assistive device. She does not require any further outpatient rehab at this time as she is fully independent.  She would like to return to driving and states that she was driving up until the most recent stroke.  Would need to confirm this with family to verify, she has been practicing with her husband around parking lots that are empty.  I feel that this is reasonable to do with husband but she needs to get an ophthalmology evaluation first.

## 2020-10-27 NOTE — Patient Instructions (Signed)
  Recommend vision evaluation including visual field testing If Ophthalmologist states vision is ok for driving , please follow instructions below  Graduated return to driving instructions were provided. It is recommended that the patient first drives with another licensed driver in an empty parking lot. If the patient does well with this, and they can drive on a quiet street with the licensed driver. If the patient does well with this they can drive on a busy street with a licensed driver. If the patient does well with this, the next time out they can go by himself. For the first month after resuming driving, I recommend no nighttime or Interstate driving.

## 2020-11-03 DIAGNOSIS — H2513 Age-related nuclear cataract, bilateral: Secondary | ICD-10-CM | POA: Diagnosis not present

## 2020-11-17 ENCOUNTER — Ambulatory Visit: Payer: Medicare HMO | Admitting: Neurology

## 2020-11-17 ENCOUNTER — Other Ambulatory Visit: Payer: Self-pay

## 2020-11-17 ENCOUNTER — Encounter: Payer: Self-pay | Admitting: Neurology

## 2020-11-17 VITALS — BP 147/90 | HR 84 | Ht 64.0 in | Wt 118.0 lb

## 2020-11-17 DIAGNOSIS — R519 Headache, unspecified: Secondary | ICD-10-CM

## 2020-11-17 DIAGNOSIS — I639 Cerebral infarction, unspecified: Secondary | ICD-10-CM | POA: Diagnosis not present

## 2020-11-17 DIAGNOSIS — I6310 Cerebral infarction due to embolism of unspecified precerebral artery: Secondary | ICD-10-CM | POA: Diagnosis not present

## 2020-11-17 DIAGNOSIS — I776 Arteritis, unspecified: Secondary | ICD-10-CM

## 2020-11-17 NOTE — Patient Instructions (Addendum)
Screening for Sleep Apnea Sleep apnea is a condition in which breathing pauses or becomes shallow during sleep. Sleep apnea screening is a test to determine if you are at risk for sleep apnea. The test includes a series of questions. It will only takes a few minutes. Your health care provider may ask you to have this test in preparation for surgery or as part of a physical exam. What are the symptoms of sleep apnea? Common symptoms of sleep apnea include: Snoring. Waking up often at night. Daytime sleepiness. Pauses in breathing. Choking or gasping during sleep. Irritability. Forgetfulness. Trouble thinking clearly. Depression. Personality changes. Most people with sleep apnea do not know that they have it. What are the advantages of sleep apnea screening? Getting screened for sleep apnea can help: Ensure your safety. It is important for your health care providers to know whether or not you have sleep apnea, especially if you are having surgery or have other long-term (chronic) health conditions. Improve your health and allow you to get a better night's rest. Restful sleep can help you: Have more energy. Lose weight. Improve high blood pressure. Improve diabetes management. Prevent stroke. Prevent car accidents. What happens during the screening? Screening usually includes being asked a list of questions about your sleep quality. Some questions you may be asked include: Do you snore? Is your sleep restless? Do you have daytime sleepiness? Has a partner or spouse told you that you stop breathing during sleep? Have you had trouble concentrating or memory loss? What is your age? What is your neck circumference? To measure your neck, keep your back straight and gently wrap the tape measure around your neck. Put the tape measure at the middle of your neck, between your chin and collarbone. What is your sex assigned at birth? Do you have or are you being treated for high blood  pressure? If your screening test is positive, you are at risk for the condition. Further testing may be needed to confirm a diagnosis of sleep apnea. Where to find more information You can find screening tools online or at your health care clinic. For more information about sleep apnea screening and healthy sleep, visit these websites: Centers for Disease Control and Prevention: http://www.wolf.info/ American Sleep Apnea Association: www.sleepapnea.org Contact a health care provider if: You think that you may have sleep apnea. Summary Sleep apnea screening can help determine if you are at risk for sleep apnea. It is important for your health care providers to know whether or not you have sleep apnea, especially if you are having surgery or have other chronic health conditions. You may be asked to take a screening test for sleep apnea in preparation for surgery or as part of a physical exam. This information is not intended to replace advice given to you by your health care provider. Make sure you discuss any questions you have with your health care provider. Document Revised: 11/27/2019 Document Reviewed: 11/27/2019 Elsevier Patient Education  2022 [place].    Component Ref Range & Units 2 mo ago   Anticardiolipin IgG 0 - 14 GPL U/mL 13   Comment:                           Negative:              <15  Indeterminate:     15 - 20                            Low-Med Positive: >20 - 80                            High Positive:         >80   Anticardiolipin IgM 0 - 12 MPL U/mL 29 High    Comment:                           Negative:              <13                            Indeterminate:     13 - 20                            Low-Med Positive: >20 - 80                            High Positive:         >80   Anticardiolipin IgA 0 - 11 APL U/mL <9   Comment:                           Negative:              <12                            Indeterminate:     12 - 20                             Low-Med Positive: >20 - 80                            High Positive:         >80   Resulting Agency  [email or web address]       Narrative Performed by: Maryan Puls Performed at:  [facility]  [address], [place], [name]  [id number]  Lab Director: Rush Farmer MD, Phone:  [phone]    Specimen Collected: 09/08/20 09:16 Last Resulted: 09/09/20 13:35          Result Care Coordination   Patient Communication  Append Comments   Seen Back to Top   These labs are fine. One is slightly elevated but not enough to be considered a problem or issue, I is fine. thanks  Written by [name], MD on 09/12/2020  6:59 PM EDT Seen by patient Mal Misty on 09/23/2020  4:26 PM

## 2020-11-17 NOTE — Progress Notes (Addendum)
SLEEP MEDICINE CLINIC    Provider:  Larey Seat, MD  Primary Care Physician:  Jacqueline Pollen, MD [place] [address]     Referring Provider: Melvenia Bonilla, [place] [place],  Jacqueline Bonilla [id number]          Chief Complaint according to patient   Patient presents with:     New Patient (Initial Visit)           HISTORY OF PRESENT ILLNESS:  Jacqueline Bonilla is a 65 y.o.  Caucasian female patient seen on 11/17/2020 from Dr. Jaynee Bonilla upon her request for a sleep consultation.  .  Chief concern according to patient :  " looking for reasons for my strokes, last in June, with 3 new lesions on MRI "   husband Jacqueline Bonilla. Internal referral from Dr. Jaynee Bonilla for morning HA and witnessed sleep apnea. Pt no longer snoring or having sleep interruptions. Sleeps about 6 hrs straights. Pt is using kinesiology tape to keep her mouth closed.    I have the pleasure of seeing Jacqueline Bonilla today, a right-handed White or Caucasian female with a possible sleep disorder.  She has a past medical history of Anemia, Anxiety, CVA- repeated cryptogenic strokes, 2022, Dysmenorrhea, Epistaxis, Endometriosis, Fibroid, Hypertension, Iron deficiency anemia, Microhematuria, Osteoporosis, Tachycardia, and Thrombocytopenia (Jacqueline Bonilla).  Loop recorder was negative for atrial fibrillation, loop recorder was implanted 12 months ago.   Sleep relevant medical history: Nocturia- 2-5 times - but not since she tapes her jaw- insomnia.  Tonsillectomy,  adenoid ectomy- twice. Epistaxis.    Family medical /sleep history: no other family member on CPAP with OSA, insomnia, sleep walkers.    Social history:  Patient is working as / retired from  and lives in a household with spouse, no pets.  persons/ alone. with grown sone age 31.  The patient currently works as an Press photographer , now retired- Tobacco use-none .  ETOH use ; none , Caffeine intake in form of Coffee( 2 cups) Soda( /) Tea ( hot) -no  energy drinks. Regular exercise in form of GYM, [employer], trampoline. .   Hobbies :  Sleep habits are as follows: The patient's dinner time is between 5 PM. The patient goes to bed at 9 PM and continues to sleep for 6-9 hours, wakes no longer bathroom breaks.   The preferred sleep position is sideways, with the support of 1 pillow.  Dreams are reportedly rare.  6.30  AM is the usual rise time. The patient wakes up spontaneously.  She reports not having been feeling refreshed or restored in AM, with symptoms such as dry mouth, morning headaches, and residual fatigue.  Used to nap a lot more before the tape was introduced: Naps are taken infrequently now.  [name] has by now had multiple episodes of embolic strokes culminating in her last event in June of this year.  She has had felt very much fatigued has attributed this previously to Plavix and has switched back to a whole aspirin then she felt better but finds she has thrombocytopenia and it was decided that she should probably go back to a smaller dose of aspirin she could even take an 81 mg every other day we discussed today.  She saw Dr. Rayann Bonilla who also follows her in the lipid clinic.  She is still in occupational therapy as she remains numb in her right hand.  At times she has been snoring loudly she felt very  fatigued very sleepy had nocturia and took daytime naps.  Her husband has manufactured a mask for her that seems to keep her jaw from dropping open and this and return has eliminated the above named sleep-related symptoms.  June 24, 2020 the patient had an MRI of the brain which showed an acute punctate infarct in the high left frontal cortex bilateral precentral gyri and a vasculitis was discussed as she has not had atrial fibrillation captured on a loop recorder which was implanted in October 2021.  Her initial symptom was brain fog quite quite in March 2021 she contracted COVID.  After that she had brain fog cognitive decline memory loss  but no balance problems there was no history of psychiatric issues.  Her mother has dementia but she is in her 13s and started showing symptoms in her 13s.  Generally the patient does not  appear anxious or depressed.    Review of Systems: Out of a complete 14 system review, the patient complains of only the following symptoms, and all other reviewed systems are negative.:    Since she uses her mask its all better. Nocturia, napping, snoring.     How likely are you to doze in the following situations: 0 = not likely, 1 = slight chance, 2 = moderate chance, 3 = high chance   Sitting and Reading? Watching Television? Sitting inactive in a public place (theater or meeting)? As a passenger in a car for an hour without a break? Lying down in the afternoon when circumstances permit? Sitting and talking to someone? Sitting quietly after lunch without alcohol? In a car, while stopped for a few minutes in traffic?   Total = now 3 , was 14/ 24 points   FSS endorsed at now 25/ 63 points.   Social History   Socioeconomic History   Marital status: Married    Spouse name: Psychologist, prison and probation services   Number of children: 1   Years of education: Not on file   Highest education level: Not on file  Occupational History   Occupation: accounting  Tobacco Use   Smoking status: Never   Smokeless tobacco: Never  Vaping Use   Vaping Use: Never used  Substance and Sexual Activity   Alcohol use: Never   Drug use: Never   Sexual activity: Not Currently    Partners: Male    Comment: husband vasectomy  Other Topics Concern   Not on file  Social History Narrative   Lives with husband   Grandchildren - 1   Works - Investment banker, 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%   Gun in home - yes - secured      Right handed   Caffeine: maybe tea every now and then   Social Determinants of Radio broadcast assistant Strain: Low Risk    Difficulty of Paying Living Expenses: Not hard at all  Food Insecurity: No Food Insecurity   Worried  About Charity fundraiser in the Last Year: Never true   Arboriculturist in the Last Year: Never true  Transportation Needs: No Transportation Needs   Lack of Transportation (Medical): No   Lack of Transportation (Non-Medical): No  Physical Activity: Sufficiently Active   Days of Exercise per Week: 5 days   Minutes of Exercise per Session: 30 min  Stress: No Stress Concern Present   Feeling of Stress : Not at all  Social Connections: Not on file    Family History  Problem Relation Age  of Onset   Cancer Father        non hodgkin lymphoma & skin   Heart attack Maternal Grandfather    Dementia Mother    Polymyositis Sister     Past Medical History:  Diagnosis Date   Anemia    Anxiety    Depression    Dysmenorrhea    Endometriosis    Fibroid    Hypertension    Microhematuria    negative workup   Osteoporosis    Tachycardia    Thrombocytopenia (HCC)     Past Surgical History:  Procedure Laterality Date   BREAST BIOPSY     CESAREAN SECTION     hysteroscopic resection     implantable loop recorder implant  10/21/2019   Medtronic Reveal Linq model LNQ 22 ([place] [id number]) implantable loop recorder     Current Outpatient Medications on File Prior to Visit  Medication Sig Dispense Refill   Ascorbic Acid (VITAMIN C PO) Take 1 tablet by mouth daily.     aspirin EC 81 MG tablet Take 81 mg by mouth daily. Swallow whole.     Cholecalciferol (VITAMIN D3 GUMMIES PO) Take 1,000 mcg by mouth daily.     Huperzine Serrate A 1 % POWD by Does not apply route 3 (three) times a week.     losartan (COZAAR) 25 MG tablet TAKE 1 TABLET(25 MG) BY MOUTH DAILY 90 tablet 3   Multiple Vitamins-Minerals (ZINC PO) Take 1 tablet by mouth daily.     NATTOKINASE PO Take by mouth daily.     Thiamine HCl (VITAMIN B-1 PO) Take 1 tablet by mouth daily.     No current facility-administered medications on file prior to visit.    Allergies  Allergen Reactions   Avelox [Moxifloxacin]    Ciprofloxacin  Hives   Floraquin [Iodoquinol]     No cipro or others   Iohexol      Code: HIVES, Desc: HIVES S/P IVP MANY YRS AGO...NO PROBLEM W/O PREP TODAY/A.CALHOUN, Onset Date: [id number]    Levaquin [Levofloxacin]    Plavix [Clopidogrel] Diarrhea   Quinolones    Shellfish Allergy Itching    Itching under the chin. Described by patient but not seen by husband.    Physical exam:  Today's Vitals   11/17/20 1125 11/17/20 1132  BP: (!) 152/86 (!) 147/90  Pulse: 78 84  Weight: 118 lb (53.5 kg)   Height: 5\' 4"  (1.626 m)    Body mass index is 20.25 kg/m.   Wt Readings from Last 3 Encounters:  11/17/20 118 lb (53.5 kg)  10/27/20 118 lb (53.5 kg)  09/06/20 115 lb 6.4 oz (52.3 kg)     Ht Readings from Last 3 Encounters:  11/17/20 5\' 4"  (1.626 m)  10/27/20 5\' 4"  (1.626 m)  09/06/20 5\' 4"  (1.626 m)      General: The patient is awake, alert and appears not in acute distress. The patient is well groomed. Head: Normocephalic, atraumatic. Neck is supple. Mallampati - uvula deviated to the left   neck circumference:13 inches . Nasal airflow  patent.  Retrognathia is  seen.  Dental status: braces and retainers were worn. Cardiovascular:  Regular rate and cardiac rhythm by pulse,  without distended neck veins. Respiratory: Lungs are clear to auscultation.  Skin:  Without evidence of ankle edema, or rash. Trunk: The patient's posture is erect.   Neurologic exam : The patient is awake and alert, oriented to place and time.   Memory subjective described as intact.  Attention span & concentration ability appears normal.  Speech is fluent,  without  dysarthria, dysphonia or aphasia.  Mood and affect are appropriate.   Cranial nerves: no loss of smell or taste reported  Pupils are equal and briskly reactive to light. Funduscopic exam deferred. .  Extraocular movements in vertical and horizontal planes were dysconjugate slight exotropia on the right. No Diplopia. Visual fields by finger perimetry  are intact. Hearing was intact to soft voice and finger rubbing.    Facial sensation intact to fine touch.  Facial motor strength is symmetric and tongue and uvula move midline.  Neck ROM : rotation, tilt and flexion extension were normal for age and shoulder shrug was symmetrical.    Motor exam:  deferred.  Sensory:  right hand finger tip numbness.  vibration was felt as normal.  Proprioception tested in the upper extremities was normal.   Coordination:  The Finger-to-nose maneuver was intact without evidence of ataxia, dysmetria or tremor.   Gait and station: Patient could rise unassisted from a seated position, walked without assistive device. She takes small steps,  turns with 5 steps, appears anxious.  Deep tendon reflexes: in the  upper and lower extremities are symmetric and intact.  Babinski response was deferred      After spending a total time of  45  minutes face to face and additional time for physical and neurologic examination, review of laboratory studies,  personal review of imaging studies, reports and results of other testing and review of referral information / records as far as provided in visit, I have established the following :  I will order a HST for the patient , split into 50% with and without her jaw guard.     My Plan is to proceed with:  I would like to thank Jacqueline Pollen, MD and Jacqueline Bonilla, [address] [place],  [place] [address] for allowing me to meet with and to take care of this pleasant patient.    CC: I will share my notes with Dr Jacqueline Bonilla.MD .  Electronically signed by: Jacqueline Seat, MD 11/17/2020 11:59 AM  [facility] Board certified by The [employer] of Sleep Medicine and Diplomate of the [employer] of Sleep Medicine. Board certified In Neurology through the [place], Fellow of the [employer] of Neurology. Medical Director of [facility].

## 2020-11-21 ENCOUNTER — Ambulatory Visit (INDEPENDENT_AMBULATORY_CARE_PROVIDER_SITE_OTHER): Payer: Medicare HMO

## 2020-11-21 DIAGNOSIS — H52221 Regular astigmatism, right eye: Secondary | ICD-10-CM | POA: Diagnosis not present

## 2020-11-21 DIAGNOSIS — I63321 Cerebral infarction due to thrombosis of right anterior cerebral artery: Secondary | ICD-10-CM | POA: Diagnosis not present

## 2020-11-21 DIAGNOSIS — H524 Presbyopia: Secondary | ICD-10-CM | POA: Diagnosis not present

## 2020-11-22 LAB — CUP PACEART REMOTE DEVICE CHECK
Date Time Interrogation Session: 20221120190537
Implantable Pulse Generator Implant Date: 20211020

## 2020-11-28 ENCOUNTER — Institutional Professional Consult (permissible substitution): Payer: Medicare HMO | Admitting: Neurology

## 2020-11-28 ENCOUNTER — Ambulatory Visit (INDEPENDENT_AMBULATORY_CARE_PROVIDER_SITE_OTHER): Payer: Medicare HMO | Admitting: Neurology

## 2020-11-28 DIAGNOSIS — R519 Headache, unspecified: Secondary | ICD-10-CM

## 2020-11-28 DIAGNOSIS — I639 Cerebral infarction, unspecified: Secondary | ICD-10-CM

## 2020-11-28 DIAGNOSIS — G4733 Obstructive sleep apnea (adult) (pediatric): Secondary | ICD-10-CM

## 2020-11-28 DIAGNOSIS — I776 Arteritis, unspecified: Secondary | ICD-10-CM

## 2020-11-28 DIAGNOSIS — I6310 Cerebral infarction due to embolism of unspecified precerebral artery: Secondary | ICD-10-CM

## 2020-11-29 NOTE — Progress Notes (Signed)
Carelink Summary Report / Loop Recorder 

## 2020-12-01 ENCOUNTER — Emergency Department (HOSPITAL_COMMUNITY): Payer: Medicare HMO

## 2020-12-01 ENCOUNTER — Encounter (HOSPITAL_COMMUNITY)
Admission: EM | Disposition: A | Discharge status: Rehabilitation Facility | Payer: Self-pay | Source: Home / Self Care | Attending: Neurology

## 2020-12-01 ENCOUNTER — Inpatient Hospital Stay (HOSPITAL_COMMUNITY): Payer: Medicare HMO

## 2020-12-01 ENCOUNTER — Emergency Department (HOSPITAL_COMMUNITY): Payer: Medicare HMO | Admitting: Certified Registered"

## 2020-12-01 ENCOUNTER — Inpatient Hospital Stay (HOSPITAL_COMMUNITY)
Admission: EM | Admit: 2020-12-01 | Discharge: 2020-12-08 | DRG: 023 | Disposition: A | Discharge status: Rehabilitation Facility | Payer: Medicare HMO | Attending: Neurology | Admitting: Neurology

## 2020-12-01 DIAGNOSIS — F0150 Vascular dementia without behavioral disturbance: Secondary | ICD-10-CM | POA: Diagnosis present

## 2020-12-01 DIAGNOSIS — I63312 Cerebral infarction due to thrombosis of left middle cerebral artery: Secondary | ICD-10-CM | POA: Diagnosis not present

## 2020-12-01 DIAGNOSIS — R4701 Aphasia: Secondary | ICD-10-CM | POA: Diagnosis not present

## 2020-12-01 DIAGNOSIS — Z807 Family history of other malignant neoplasms of lymphoid, hematopoietic and related tissues: Secondary | ICD-10-CM

## 2020-12-01 DIAGNOSIS — I639 Cerebral infarction, unspecified: Secondary | ICD-10-CM | POA: Diagnosis not present

## 2020-12-01 DIAGNOSIS — I69320 Aphasia following cerebral infarction: Secondary | ICD-10-CM | POA: Diagnosis not present

## 2020-12-01 DIAGNOSIS — I63512 Cerebral infarction due to unspecified occlusion or stenosis of left middle cerebral artery: Secondary | ICD-10-CM | POA: Diagnosis not present

## 2020-12-01 DIAGNOSIS — D649 Anemia, unspecified: Secondary | ICD-10-CM | POA: Diagnosis not present

## 2020-12-01 DIAGNOSIS — F32A Depression, unspecified: Secondary | ICD-10-CM | POA: Diagnosis not present

## 2020-12-01 DIAGNOSIS — R0902 Hypoxemia: Secondary | ICD-10-CM | POA: Diagnosis not present

## 2020-12-01 DIAGNOSIS — M810 Age-related osteoporosis without current pathological fracture: Secondary | ICD-10-CM | POA: Diagnosis present

## 2020-12-01 DIAGNOSIS — I609 Nontraumatic subarachnoid hemorrhage, unspecified: Secondary | ICD-10-CM | POA: Diagnosis present

## 2020-12-01 DIAGNOSIS — Z9282 Status post administration of tPA (rtPA) in a different facility within the last 24 hours prior to admission to current facility: Secondary | ICD-10-CM | POA: Diagnosis not present

## 2020-12-01 DIAGNOSIS — I10 Essential (primary) hypertension: Secondary | ICD-10-CM | POA: Diagnosis not present

## 2020-12-01 DIAGNOSIS — I63412 Cerebral infarction due to embolism of left middle cerebral artery: Principal | ICD-10-CM | POA: Diagnosis present

## 2020-12-01 DIAGNOSIS — Z91041 Radiographic dye allergy status: Secondary | ICD-10-CM

## 2020-12-01 DIAGNOSIS — Z4659 Encounter for fitting and adjustment of other gastrointestinal appliance and device: Secondary | ICD-10-CM

## 2020-12-01 DIAGNOSIS — I6389 Other cerebral infarction: Secondary | ICD-10-CM | POA: Diagnosis not present

## 2020-12-01 DIAGNOSIS — I69351 Hemiplegia and hemiparesis following cerebral infarction affecting right dominant side: Secondary | ICD-10-CM | POA: Diagnosis not present

## 2020-12-01 DIAGNOSIS — Z79899 Other long term (current) drug therapy: Secondary | ICD-10-CM | POA: Diagnosis not present

## 2020-12-01 DIAGNOSIS — D696 Thrombocytopenia, unspecified: Secondary | ICD-10-CM | POA: Diagnosis not present

## 2020-12-01 DIAGNOSIS — E785 Hyperlipidemia, unspecified: Secondary | ICD-10-CM | POA: Diagnosis present

## 2020-12-01 DIAGNOSIS — R579 Shock, unspecified: Secondary | ICD-10-CM | POA: Diagnosis not present

## 2020-12-01 DIAGNOSIS — I611 Nontraumatic intracerebral hemorrhage in hemisphere, cortical: Secondary | ICD-10-CM | POA: Diagnosis present

## 2020-12-01 DIAGNOSIS — R569 Unspecified convulsions: Secondary | ICD-10-CM | POA: Diagnosis not present

## 2020-12-01 DIAGNOSIS — I6300 Cerebral infarction due to thrombosis of unspecified precerebral artery: Secondary | ICD-10-CM | POA: Diagnosis not present

## 2020-12-01 DIAGNOSIS — Z888 Allergy status to other drugs, medicaments and biological substances status: Secondary | ICD-10-CM | POA: Diagnosis not present

## 2020-12-01 DIAGNOSIS — Z881 Allergy status to other antibiotic agents status: Secondary | ICD-10-CM

## 2020-12-01 DIAGNOSIS — Z6820 Body mass index (BMI) 20.0-20.9, adult: Secondary | ICD-10-CM

## 2020-12-01 DIAGNOSIS — E118 Type 2 diabetes mellitus with unspecified complications: Secondary | ICD-10-CM | POA: Diagnosis not present

## 2020-12-01 DIAGNOSIS — I69354 Hemiplegia and hemiparesis following cerebral infarction affecting left non-dominant side: Secondary | ICD-10-CM | POA: Diagnosis not present

## 2020-12-01 DIAGNOSIS — R140 Abdominal distension (gaseous): Secondary | ICD-10-CM | POA: Diagnosis not present

## 2020-12-01 DIAGNOSIS — Z7982 Long term (current) use of aspirin: Secondary | ICD-10-CM | POA: Diagnosis not present

## 2020-12-01 DIAGNOSIS — R1310 Dysphagia, unspecified: Secondary | ICD-10-CM | POA: Diagnosis present

## 2020-12-01 DIAGNOSIS — I340 Nonrheumatic mitral (valve) insufficiency: Secondary | ICD-10-CM | POA: Diagnosis not present

## 2020-12-01 DIAGNOSIS — I619 Nontraumatic intracerebral hemorrhage, unspecified: Secondary | ICD-10-CM | POA: Diagnosis not present

## 2020-12-01 DIAGNOSIS — I69392 Facial weakness following cerebral infarction: Secondary | ICD-10-CM | POA: Diagnosis not present

## 2020-12-01 DIAGNOSIS — R29706 NIHSS score 6: Secondary | ICD-10-CM | POA: Diagnosis present

## 2020-12-01 DIAGNOSIS — Z8249 Family history of ischemic heart disease and other diseases of the circulatory system: Secondary | ICD-10-CM

## 2020-12-01 DIAGNOSIS — R0989 Other specified symptoms and signs involving the circulatory and respiratory systems: Secondary | ICD-10-CM

## 2020-12-01 DIAGNOSIS — Z20822 Contact with and (suspected) exposure to covid-19: Secondary | ICD-10-CM | POA: Diagnosis not present

## 2020-12-01 DIAGNOSIS — I160 Hypertensive urgency: Secondary | ICD-10-CM | POA: Diagnosis not present

## 2020-12-01 DIAGNOSIS — Z91013 Allergy to seafood: Secondary | ICD-10-CM

## 2020-12-01 DIAGNOSIS — G4730 Sleep apnea, unspecified: Secondary | ICD-10-CM | POA: Diagnosis present

## 2020-12-01 DIAGNOSIS — F418 Other specified anxiety disorders: Secondary | ICD-10-CM | POA: Diagnosis not present

## 2020-12-01 DIAGNOSIS — S066X0A Traumatic subarachnoid hemorrhage without loss of consciousness, initial encounter: Secondary | ICD-10-CM | POA: Diagnosis not present

## 2020-12-01 DIAGNOSIS — I63039 Cerebral infarction due to thrombosis of unspecified carotid artery: Secondary | ICD-10-CM | POA: Diagnosis not present

## 2020-12-01 DIAGNOSIS — E440 Moderate protein-calorie malnutrition: Secondary | ICD-10-CM | POA: Diagnosis present

## 2020-12-01 DIAGNOSIS — G988 Other disorders of nervous system: Secondary | ICD-10-CM | POA: Diagnosis not present

## 2020-12-01 DIAGNOSIS — R339 Retention of urine, unspecified: Secondary | ICD-10-CM | POA: Diagnosis present

## 2020-12-01 DIAGNOSIS — G319 Degenerative disease of nervous system, unspecified: Secondary | ICD-10-CM | POA: Diagnosis not present

## 2020-12-01 DIAGNOSIS — I6523 Occlusion and stenosis of bilateral carotid arteries: Secondary | ICD-10-CM | POA: Diagnosis not present

## 2020-12-01 DIAGNOSIS — Z8673 Personal history of transient ischemic attack (TIA), and cerebral infarction without residual deficits: Secondary | ICD-10-CM | POA: Diagnosis not present

## 2020-12-01 DIAGNOSIS — Z4682 Encounter for fitting and adjustment of non-vascular catheter: Secondary | ICD-10-CM | POA: Diagnosis not present

## 2020-12-01 HISTORY — PX: IR PERCUTANEOUS ART THROMBECTOMY/INFUSION INTRACRANIAL INC DIAG ANGIO: IMG6087

## 2020-12-01 HISTORY — PX: IR US GUIDE VASC ACCESS RIGHT: IMG2390

## 2020-12-01 HISTORY — PX: RADIOLOGY WITH ANESTHESIA: SHX6223

## 2020-12-01 HISTORY — PX: IR CT HEAD LTD: IMG2386

## 2020-12-01 LAB — I-STAT CHEM 8, ED
BUN: 34 mg/dL — ABNORMAL HIGH (ref 8–23)
Calcium, Ion: 1.15 mmol/L (ref 1.15–1.40)
Chloride: 103 mmol/L (ref 98–111)
Creatinine, Ser: 1.1 mg/dL — ABNORMAL HIGH (ref 0.44–1.00)
Glucose, Bld: 131 mg/dL — ABNORMAL HIGH (ref 70–99)
HCT: 38 % (ref 36.0–46.0)
Hemoglobin: 12.9 g/dL (ref 12.0–15.0)
Potassium: 4.2 mmol/L (ref 3.5–5.1)
Sodium: 140 mmol/L (ref 135–145)
TCO2: 29 mmol/L (ref 22–32)

## 2020-12-01 LAB — CBC
HCT: 38.8 % (ref 36.0–46.0)
Hemoglobin: 12.5 g/dL (ref 12.0–15.0)
MCH: 30.3 pg (ref 26.0–34.0)
MCHC: 32.2 g/dL (ref 30.0–36.0)
MCV: 93.9 fL (ref 80.0–100.0)
Platelets: 191 10*3/uL (ref 150–400)
RBC: 4.13 MIL/uL (ref 3.87–5.11)
RDW: 13.2 % (ref 11.5–15.5)
WBC: 7.2 10*3/uL (ref 4.0–10.5)
nRBC: 0 % (ref 0.0–0.2)

## 2020-12-01 LAB — COMPREHENSIVE METABOLIC PANEL
ALT: 25 U/L (ref 0–44)
AST: 28 U/L (ref 15–41)
Albumin: 3.7 g/dL (ref 3.5–5.0)
Alkaline Phosphatase: 54 U/L (ref 38–126)
Anion gap: 8 (ref 5–15)
BUN: 31 mg/dL — ABNORMAL HIGH (ref 8–23)
CO2: 27 mmol/L (ref 22–32)
Calcium: 9.5 mg/dL (ref 8.9–10.3)
Chloride: 104 mmol/L (ref 98–111)
Creatinine, Ser: 1.01 mg/dL — ABNORMAL HIGH (ref 0.44–1.00)
GFR, Estimated: >60 mL/min (ref >60)
Glucose, Bld: 132 mg/dL — ABNORMAL HIGH (ref 70–99)
Potassium: 4.2 mmol/L (ref 3.5–5.1)
Sodium: 139 mmol/L (ref 135–145)
Total Bilirubin: 0.6 mg/dL (ref 0.3–1.2)
Total Protein: 7.1 g/dL (ref 6.5–8.1)

## 2020-12-01 LAB — PROTIME-INR
INR: 1 (ref 0.8–1.2)
Prothrombin Time: 13.3 seconds (ref 11.4–15.2)

## 2020-12-01 LAB — RESP PANEL BY RT-PCR (FLU A&B, COVID) ARPGX2
Influenza A by PCR: NEGATIVE
Influenza B by PCR: NEGATIVE
SARS Coronavirus 2 by RT PCR: NEGATIVE

## 2020-12-01 LAB — MRSA NEXT GEN BY PCR, NASAL: MRSA by PCR Next Gen: NOT DETECTED

## 2020-12-01 LAB — DIFFERENTIAL
Abs Immature Granulocytes: 0.02 10*3/uL (ref 0.00–0.07)
Basophils Absolute: 0.1 10*3/uL (ref 0.0–0.1)
Basophils Relative: 1 %
Eosinophils Absolute: 0.1 10*3/uL (ref 0.0–0.5)
Eosinophils Relative: 1 %
Immature Granulocytes: 0 %
Lymphocytes Relative: 17 %
Lymphs Abs: 1.3 10*3/uL (ref 0.7–4.0)
Monocytes Absolute: 0.7 10*3/uL (ref 0.1–1.0)
Monocytes Relative: 10 %
Neutro Abs: 5.1 10*3/uL (ref 1.7–7.7)
Neutrophils Relative %: 71 %

## 2020-12-01 LAB — TYPE AND SCREEN
ABO/RH(D): A POS
Antibody Screen: NEGATIVE

## 2020-12-01 LAB — ABO/RH: ABO/RH(D): A POS

## 2020-12-01 LAB — FIBRINOGEN: Fibrinogen: 293 mg/dL (ref 210–475)

## 2020-12-01 LAB — APTT: aPTT: 36 seconds (ref 24–36)

## 2020-12-01 LAB — CBG MONITORING, ED: Glucose-Capillary: 142 mg/dL — ABNORMAL HIGH (ref 70–99)

## 2020-12-01 IMAGING — CT CT HEAD CODE STROKE
3 series · 15 of 47 positions shown, 18 images · non-contrast
Comparison: [DATE]

CLINICAL DATA: Code stroke.  Aphasia, right-sided weakness

EXAM:
CT HEAD WITHOUT CONTRAST
TECHNIQUE: Contiguous axial images were obtained from the base of the skull
through the vertex without intravenous contrast.

[Series 3: head 5.0 h30s · axial · 0.43mm/px · z∈[-96,+34]mm · 9 of 32 slices shown, 12 images]
[im 3/32  brain]
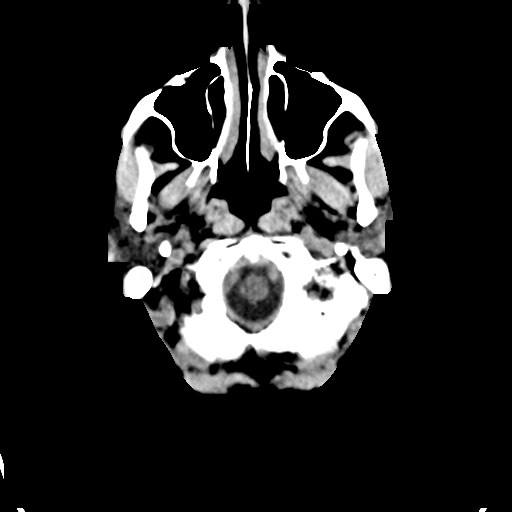
[im 3/32  bone]
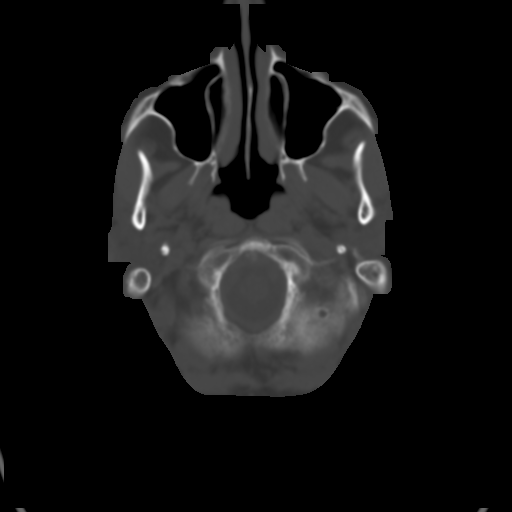
[im 6/32  brain]
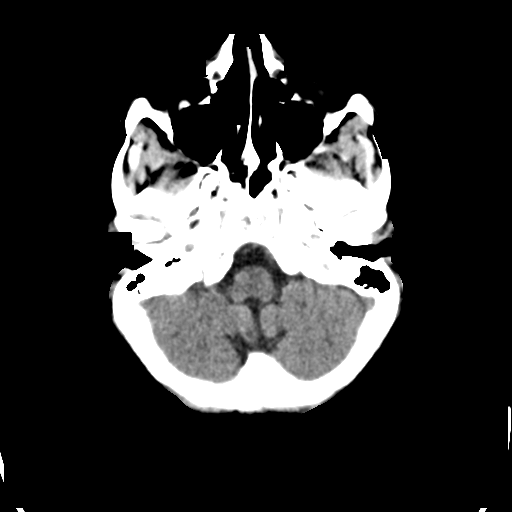
[im 9/32  brain]
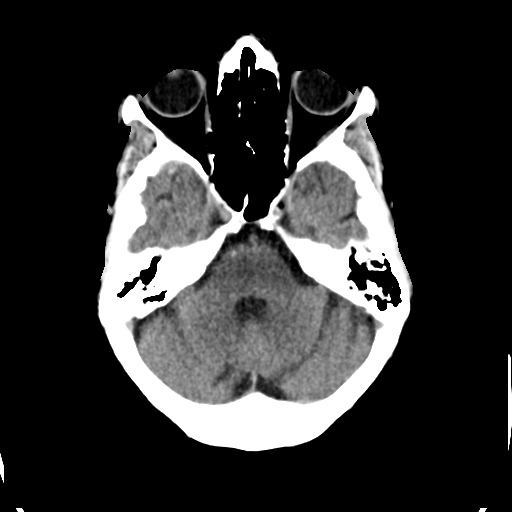
[im 12/32  brain]
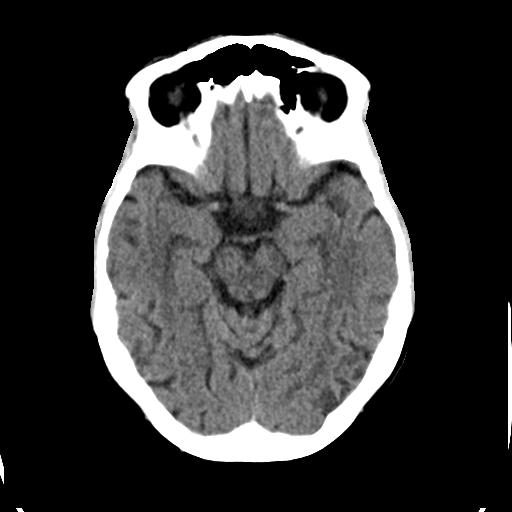
[im 17/32  brain]
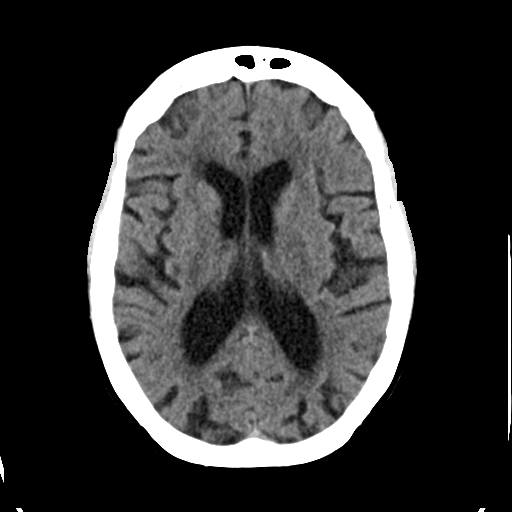
[im 17/32  bone]
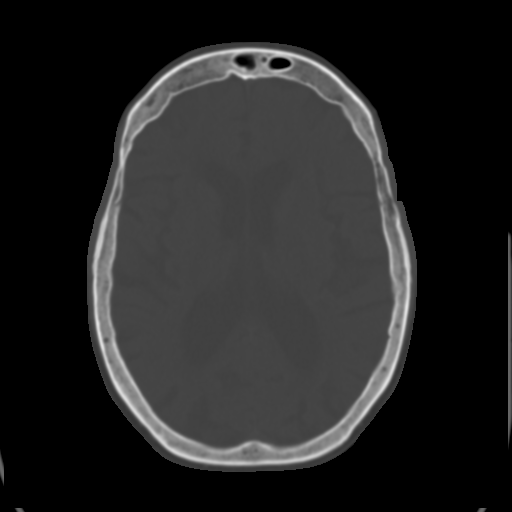
[im 20/32  brain]
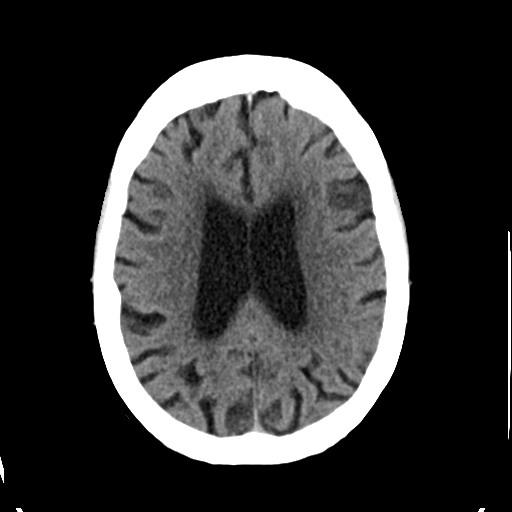
[im 23/32  brain]
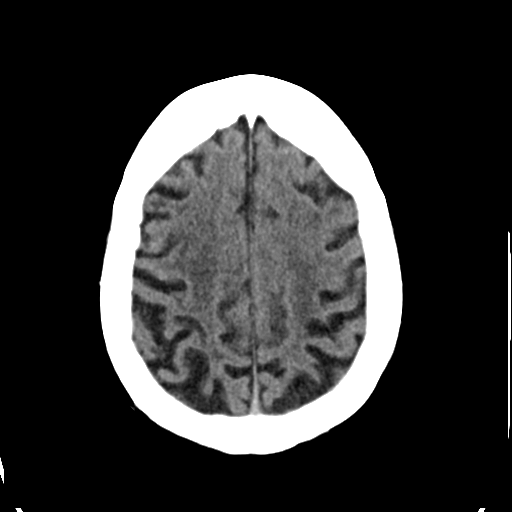
[im 26/32  brain]
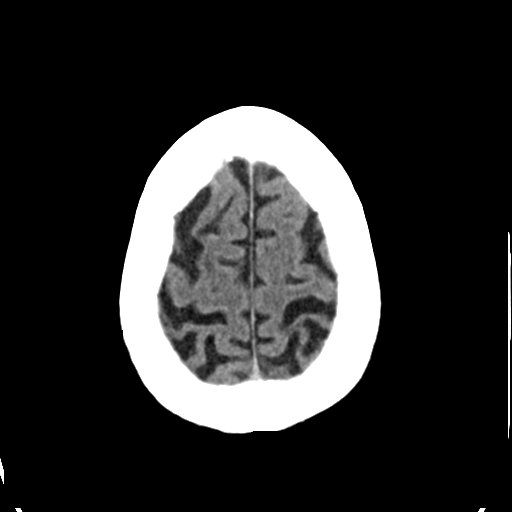
[im 29/32  brain]
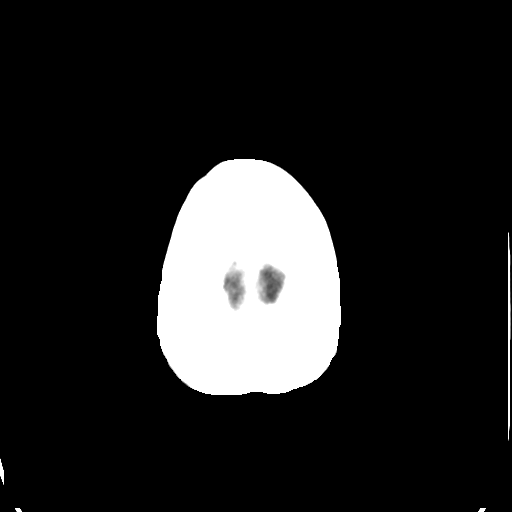
[im 29/32  bone]
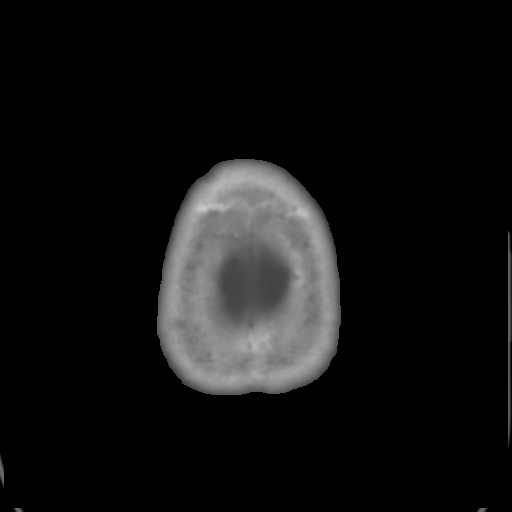

[Series 5: head 3.0 mpr cor · coronal · 0.31mm/px · 3 of 72 slices shown]
[im 24/72  brain]
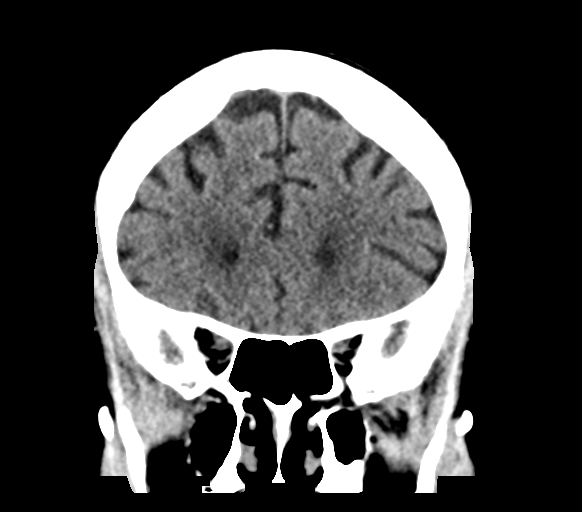
[im 32/72  brain]
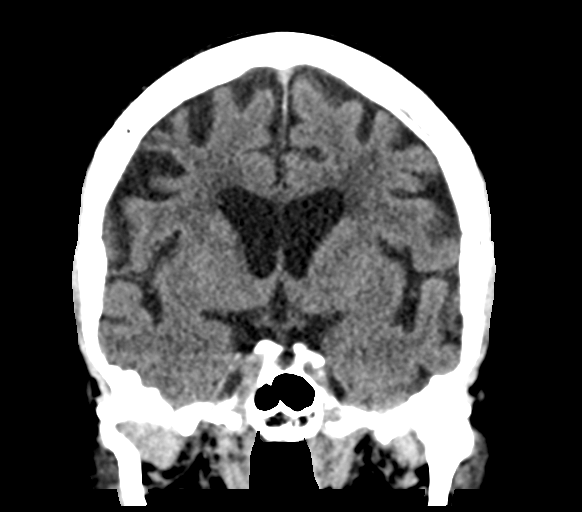
[im 40/72  brain]
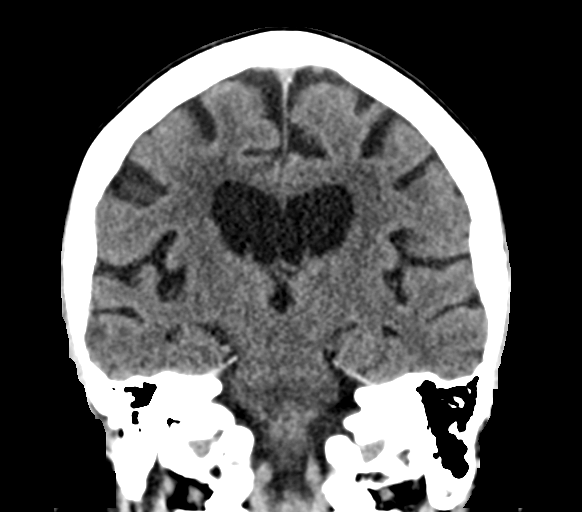

[Series 6: head 3.0 mpr sag · sagittal · 0.31mm/px · 3 of 59 slices shown]
[im 20/59  brain]
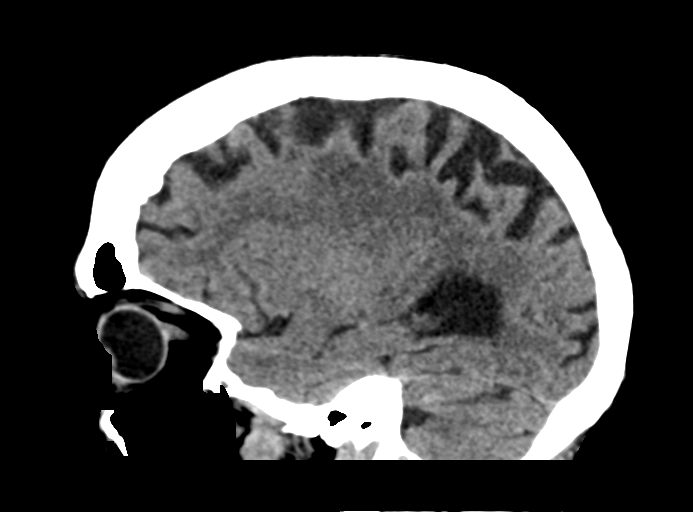
[im 30/59  brain]
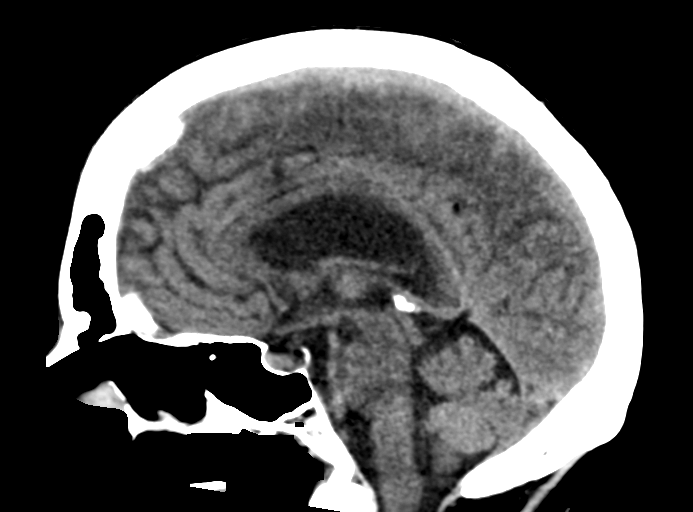
[im 39/59  brain]
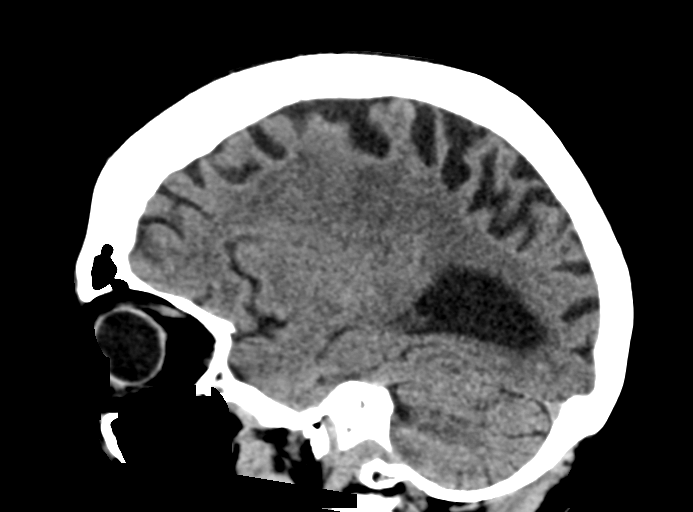

[15 of 47 positions shown; findings below may reference images not displayed]

FINDINGS: Brain: There is no acute intracranial hemorrhage, mass effect, or
edema. No new loss of gray-white differentiation. Prominence of the
ventricles and sulci reflects similar parenchymal volume loss.
Scattered chronic cortical infarcts. Small chronic infarct of the
body of the left caudate. Small chronic right greater than left
cerebellar infarcts. No extra-axial collection.

Vascular: No hyperdense vessel. There is intracranial
atherosclerotic calcification at the skull base.

Skull: Unremarkable.

Sinuses/Orbits: No acute abnormality.

Other: Mastoid air cells are clear.

ASPECTS (Alberta Stroke Program Early CT Score)

- Ganglionic level infarction (caudate, lentiform nuclei, internal
capsule, insula, M1-M3 cortex): 7

- Supraganglionic infarction (M4-M6 cortex): 3

Total score (0-10 with 10 being normal): 10
IMPRESSION: There is no acute intracranial hemorrhage or evidence of acute
infarction. ASPECT score is 10.

These results were communicated to Dr. LOONEY at [DATE] on
[DATE] by text page via the [employer] messaging system.

## 2020-12-01 IMAGING — CT CT HEAD W/O CM
3 of 4 series · 13 of 47 positions shown, 15 images · non-contrast
Comparison: Flat panel head CT [DATE]. And head CT [DATE] at [DATE] a.m.

CLINICAL DATA: Stroke, follow up. Stroke s/p left M3/MCA
thrombectomy with RINK vs contrast extravasation. Evaluate for
hemorrhage progression compared to post procedural flat panel CT.

EXAM:
CT HEAD WITHOUT CONTRAST
TECHNIQUE: Contiguous axial images were obtained from the base of the skull
through the vertex without intravenous contrast.

[Series 3: head without · axial · non-contrast · 0.43mm/px · z∈[-185,-55]mm · 7 of 36 slices shown, 9 images]
[im 5/36  brain]
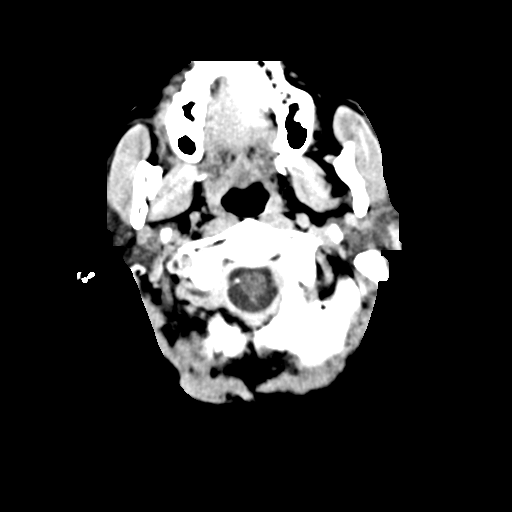
[im 5/36  bone]
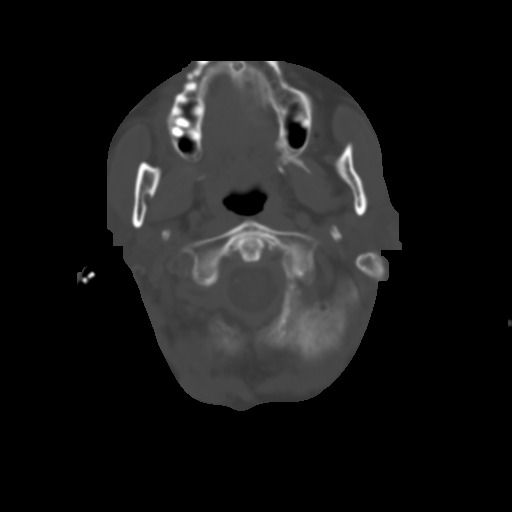
[im 9/36  brain]
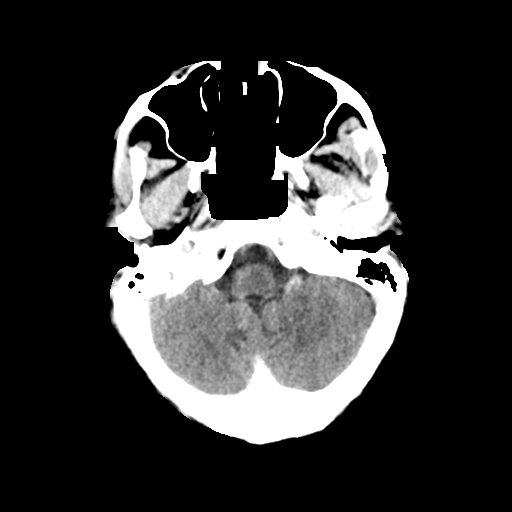
[im 14/36  brain]
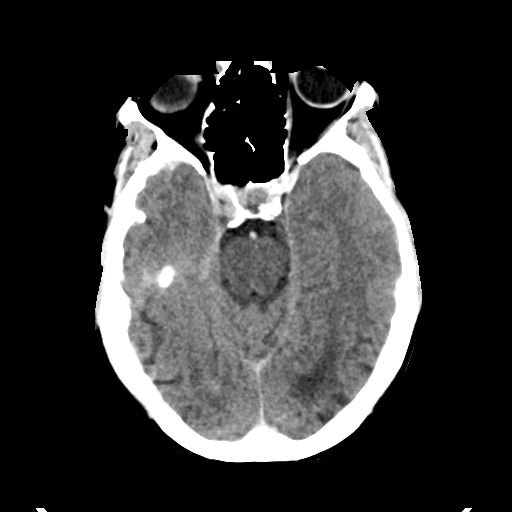
[im 18/36  brain]
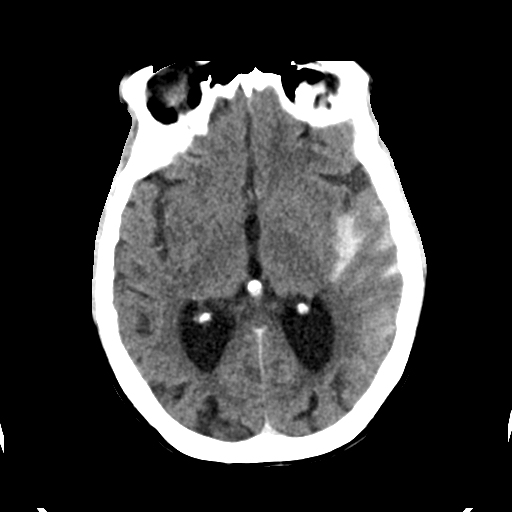
[im 22/36  brain]
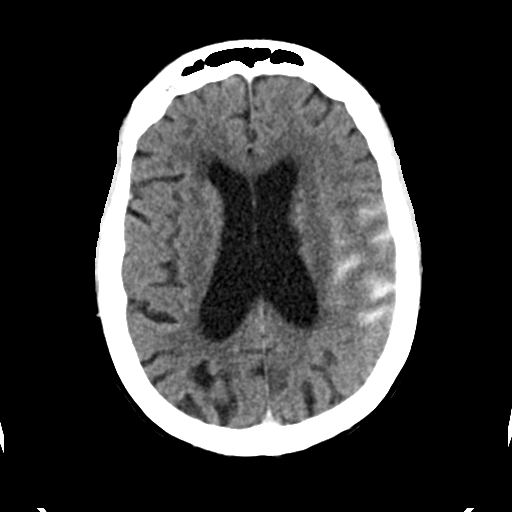
[im 22/36  bone]
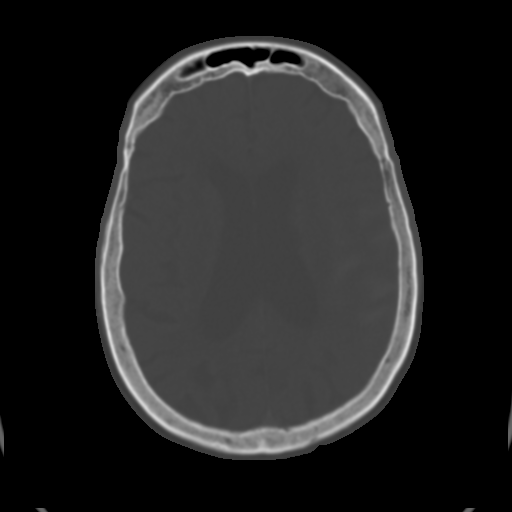
[im 27/36  brain]
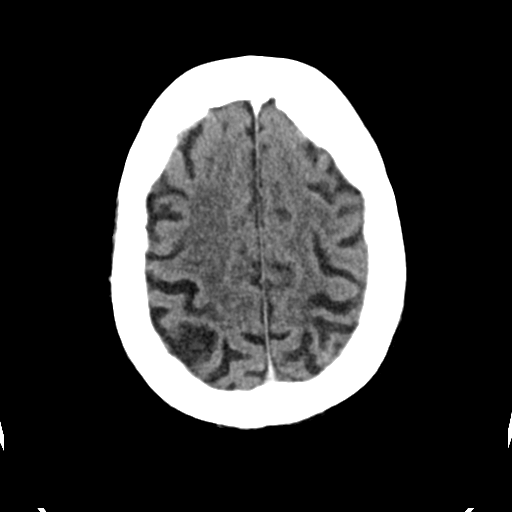
[im 31/36  brain]
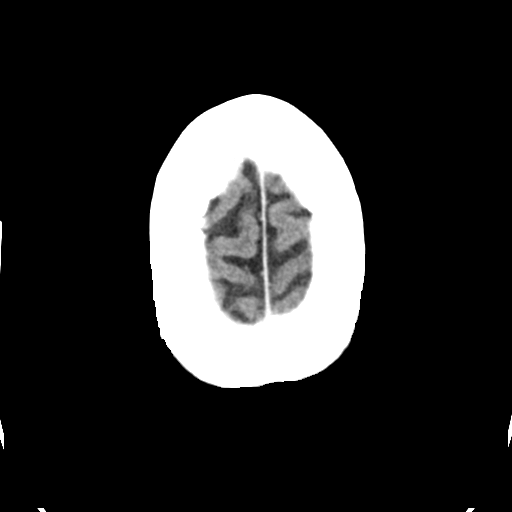

[Series 5: head without cor · coronal · non-contrast · 0.35mm/px · 3 of 70 slices shown]
[im 24/70  brain]
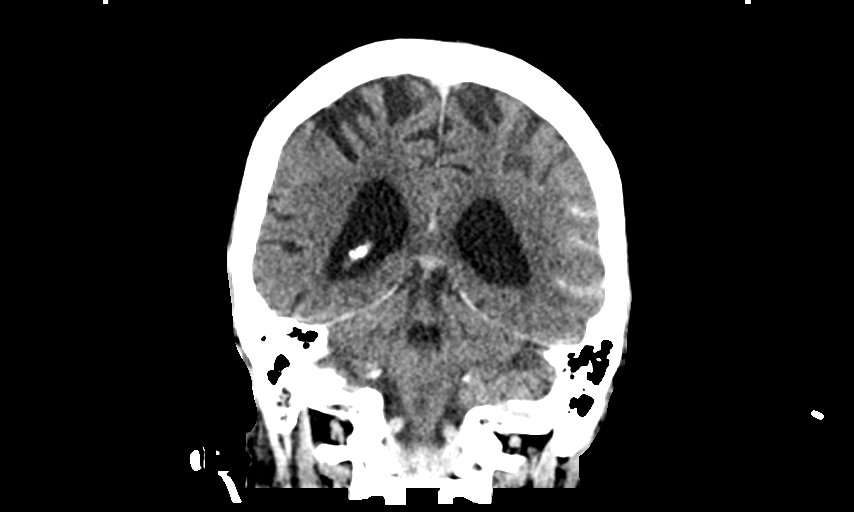
[im 31/70  brain]
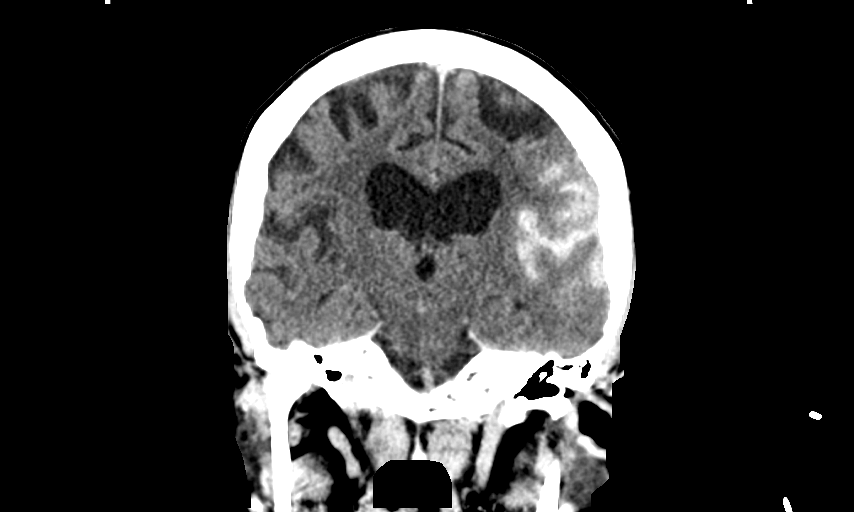
[im 39/70  brain]
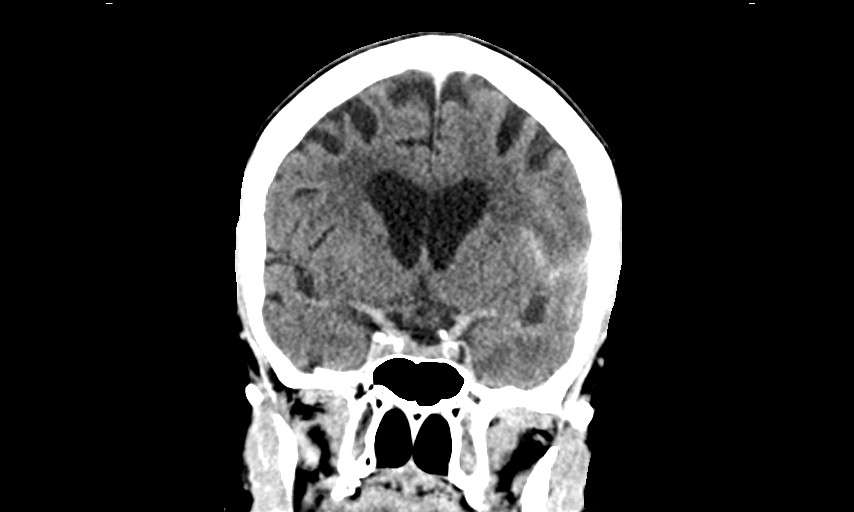

[Series 6: head without sag · sagittal · non-contrast · 0.35mm/px · 3 of 59 slices shown]
[im 20/59  brain]
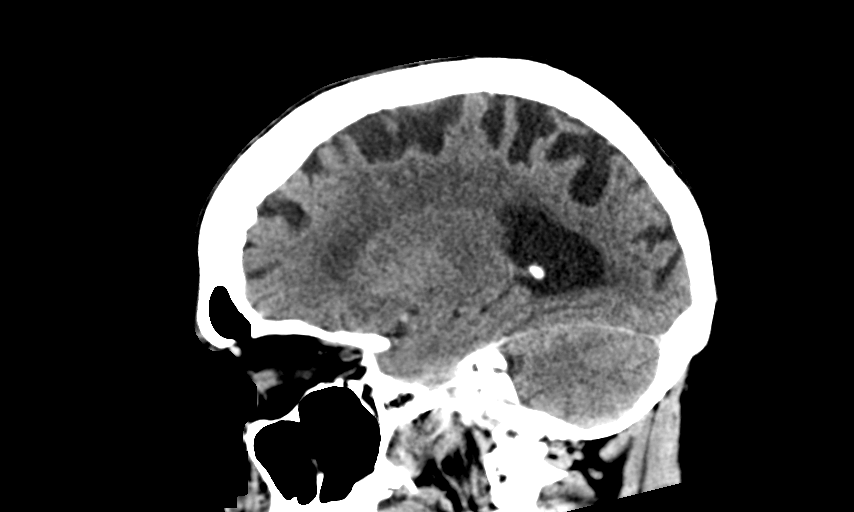
[im 30/59  brain]
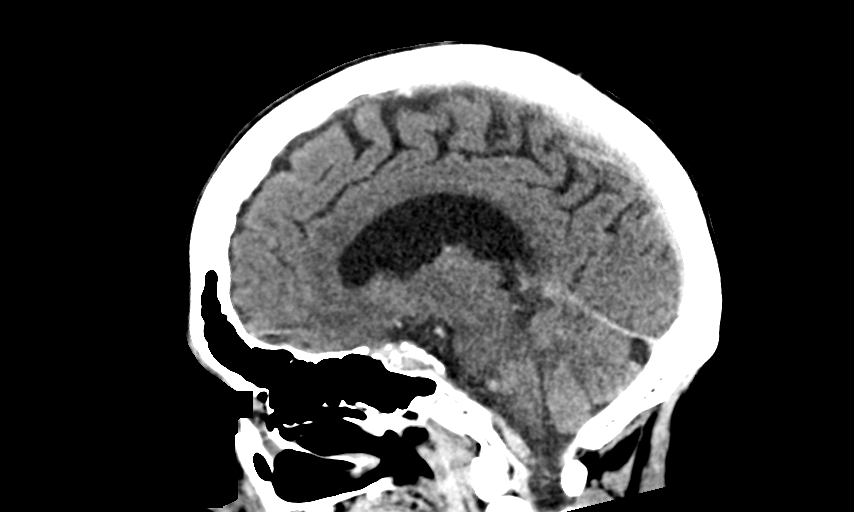
[im 39/59  brain]
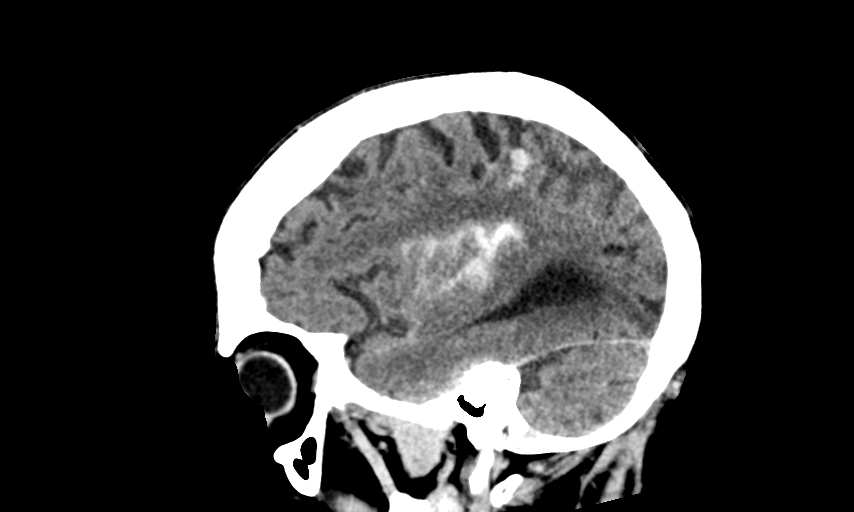

[13 of 47 positions shown; findings below may reference images not displayed]

FINDINGS: Brain: There has been interval increase of the hyperdensity in the
left sylvian fissure, now extending to the cortical sulci in the
left parietal lobe, consistent with subarachnoid hemorrhage. Small
amount of blood is also seen in the interpeduncular cistern. There
is no hydrocephalus.

No large acute territorial infarct identified. Chronic scattered
cortical infarcts are unchanged from prior CT.

Vascular: Hyperdense vessels are related to recent contrast
administration.

Skull: Normal. Negative for fracture or focal lesion.

Sinuses/Orbits: No acute finding.

Other: None.
IMPRESSION: Interval increase in size of the RINK subarachnoid
hemorrhage, now extending to the posterior parietal cortical sulci
with small amount of blood in the interpeduncular cistern. No
hydrocephalus.

These results were discussed by telephone at the time of
interpretation on [DATE] at [DATE] to provider Dr. RINK, who
verbally acknowledged these results.

## 2020-12-01 IMAGING — CT CT HEAD W/O CM
4 series · 17 of 47 positions shown, 19 images · non-contrast
Comparison: CT head earlier today [DATE]

CLINICAL DATA: Postop left MCA thrombectomy. Stroke. Follow-up
subarachnoid hemorrhage

EXAM:
CT HEAD WITHOUT CONTRAST
TECHNIQUE: Contiguous axial images were obtained from the base of the skull
through the vertex without intravenous contrast.

[Series 3: head without · axial · non-contrast · 0.42mm/px · z∈[-114,+6]mm · 7 of 34 slices shown, 9 images]
[im 5/34  brain]
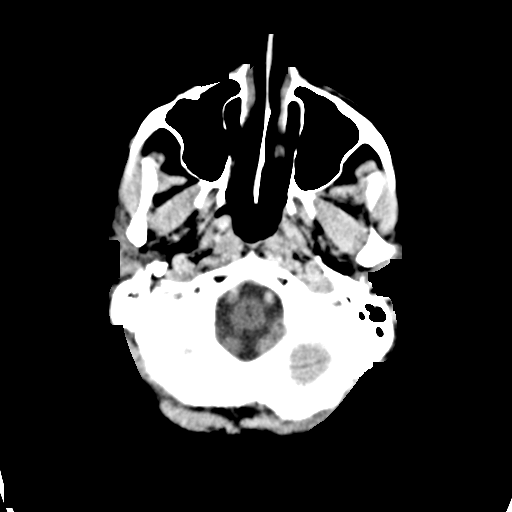
[im 5/34  bone]
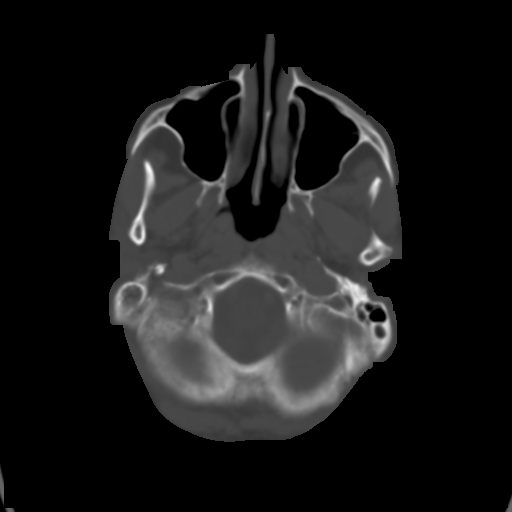
[im 9/34  brain]
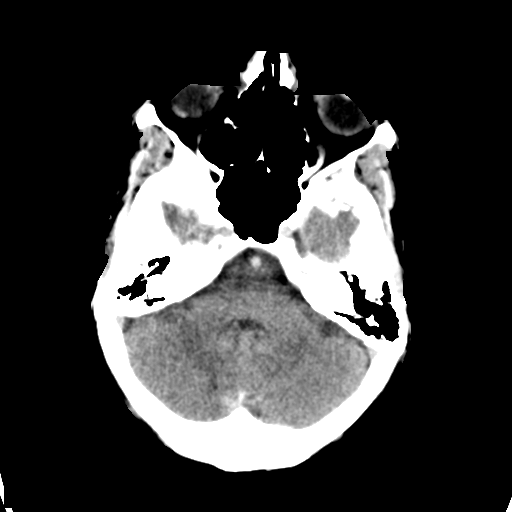
[im 13/34  brain]
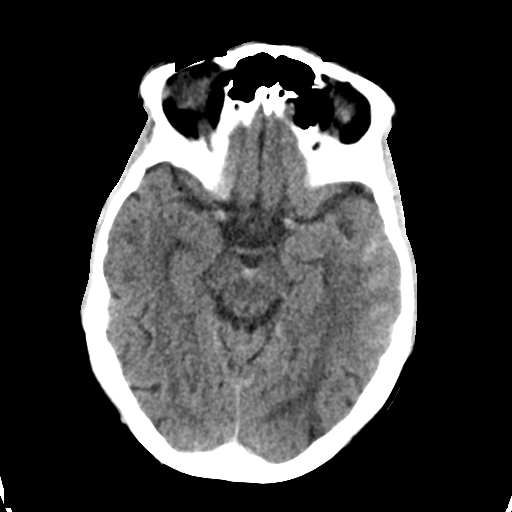
[im 17/34  brain]
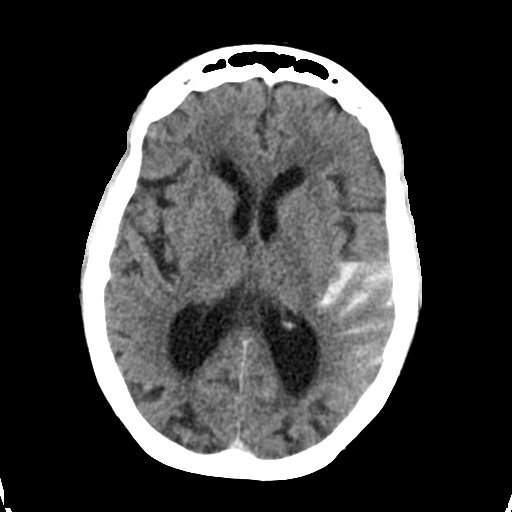
[im 21/34  brain]
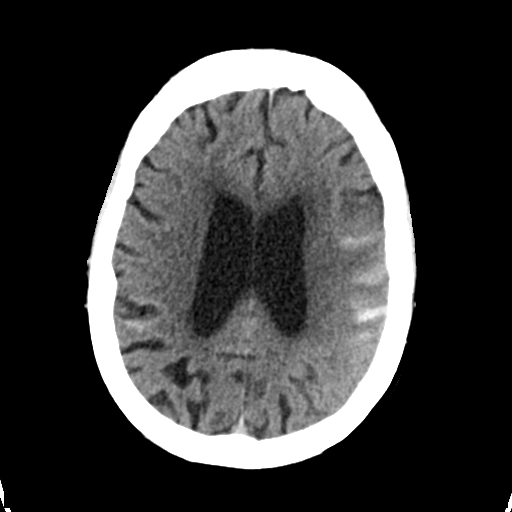
[im 21/34  bone]
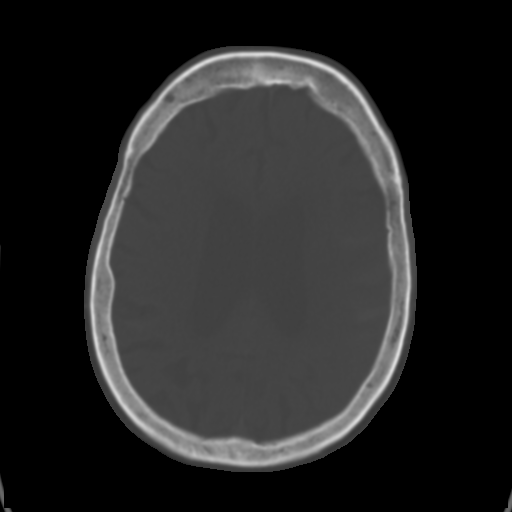
[im 25/34  brain]
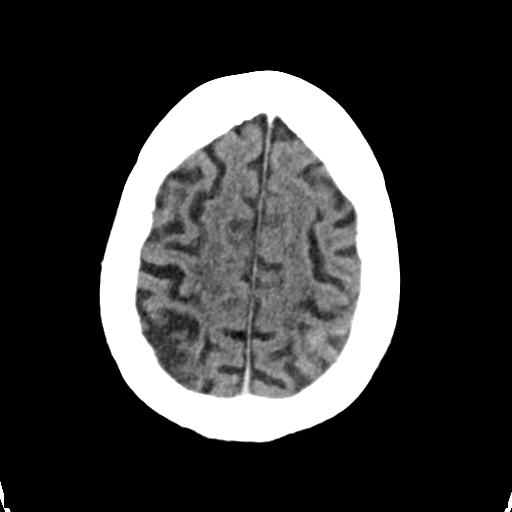
[im 29/34  brain]
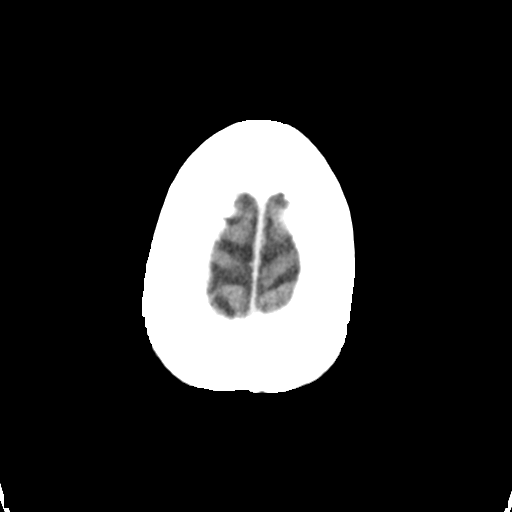

[Series 4: head bone · axial · 0.42mm/px · z∈[-118,-60]mm · 4 of 84 slices shown]
[im 9/84  bone]
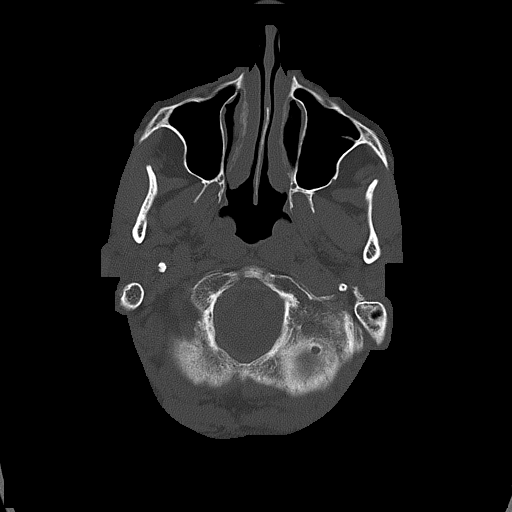
[im 17/84  bone]
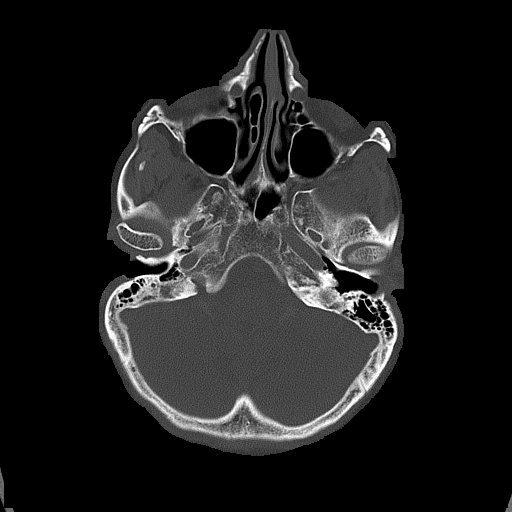
[im 25/84  bone]
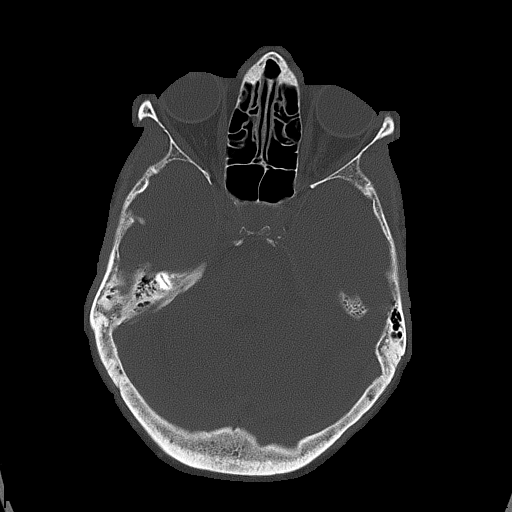
[im 38/84  bone]
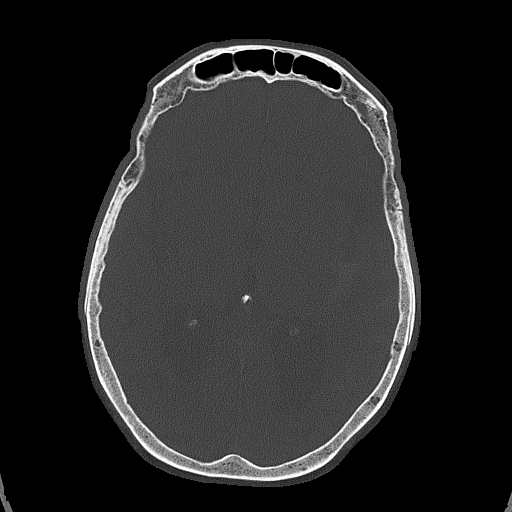

[Series 5: head without cor · coronal · non-contrast · 0.33mm/px · 3 of 71 slices shown]
[im 27/71  brain]
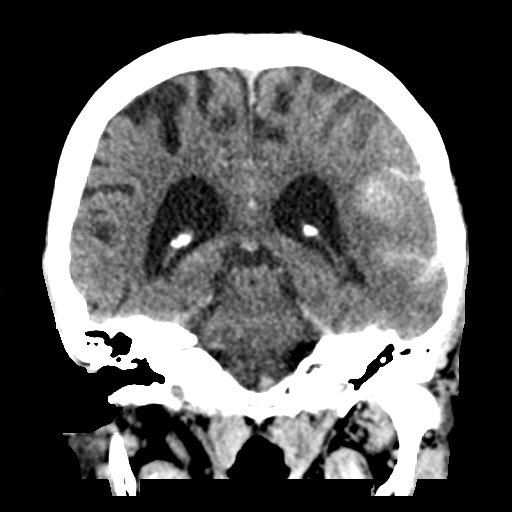
[im 33/71  brain]
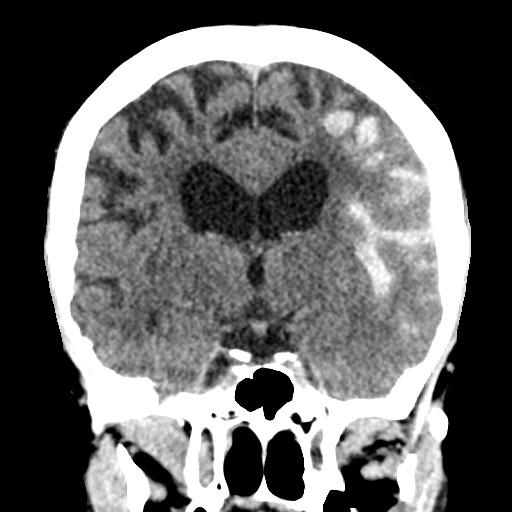
[im 38/71  brain]
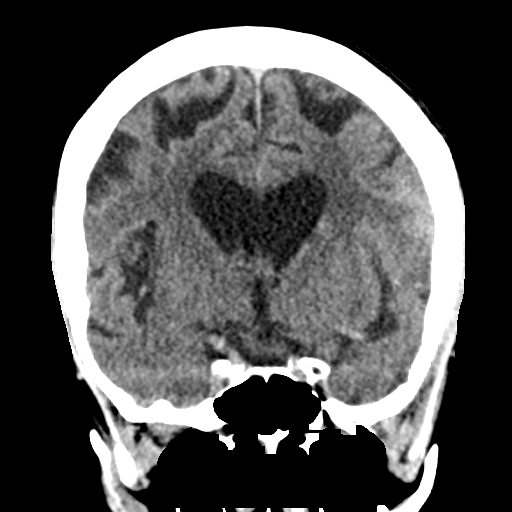

[Series 6: head without sag · sagittal · non-contrast · 0.37mm/px · 3 of 62 slices shown]
[im 21/62  brain]
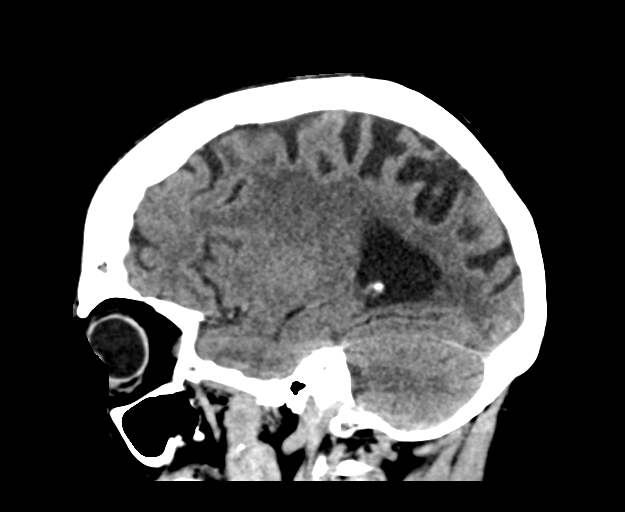
[im 31/62  brain]
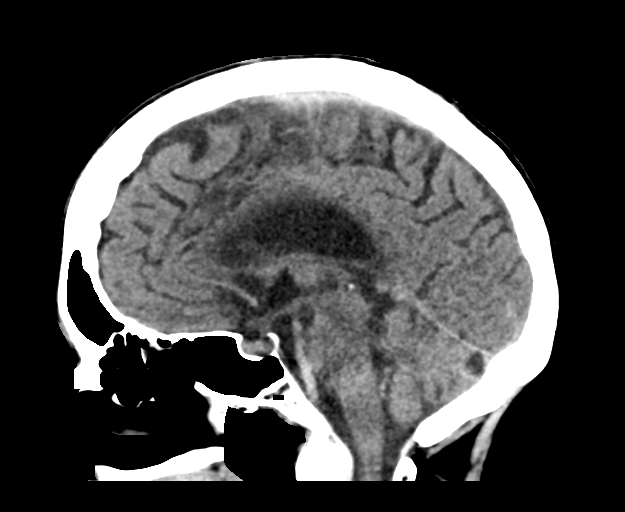
[im 41/62  brain]
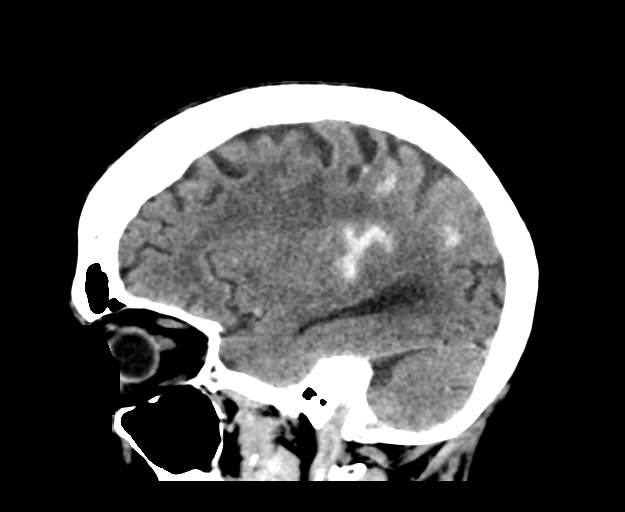

[17 of 47 positions shown; findings below may reference images not displayed]

FINDINGS: Brain: Subarachnoid hemorrhage and possible subarachnoid contrast on
the left is unchanged from earlier today. This is in the sylvian
fissure and in the left parietal sulci. Small amount of hemorrhage
in the interpeduncular cistern unchanged. Minimal subarachnoid
hemorrhage right parietal lobe unchanged.

Negative for hydrocephalus. Generalized atrophy. Chronic
microvascular ischemic change in the white matter. No acute cortical
infarct.

Vascular: Negative for hyperdense vessel

Skull: Negative

Sinuses/Orbits: Paranasal sinuses clear.  Negative orbit

Other: None
IMPRESSION: 1. Acute subarachnoid hemorrhage, stable from earlier today. No
hydrocephalus or new hemorrhage.
2. No acute ischemic infarct
3. Atrophy and chronic microvascular ischemic change in

## 2020-12-01 IMAGING — XA IR PERCUTANEOUS ART THORMBECTOMY/INFUSION INTRACRANIAL INCLUDE D
8 of 9 series · 13 of 24 positions shown · IV contrast (IODINE)
Comparison: CT/CT angiogram of the head and neck [DATE].

INDICATION: 65-year-old female with past medical history significant for anemia,
anxiety, depression, two prior strokes in the past 14 months (one
presenting with right sided weakness, followed by improvement to
mild residual deficit; the second with left sided weakness), HTN,
loop recorder implant in [DATE], osteoporosis and
thrombocytopenia. Two presented to emergency by EMS with acute onset
of aphasia, right side weakness and incoordination. NIHSS 6 at
presentation. Head CT showed no large acute territorial infarct or
hemorrhage. CT angiogram of the head and neck showed a left M3/MCA
occlusion. She received ODEDARA at [DATE] a.m. on [DATE]. Given
disabling symptoms, decision was made to proceed with diagnostic
cerebral angiogram and mechanical thrombectomy.

EXAM:
ULTRASOUND-GUIDED VASCULAR [REDACTED] CEREBRAL ANGIOGRAM
MECHANICAL THROMBECTOMY
FLAT PANEL HEAD CT
TECHNIQUE: Informed written consent was obtained from the patient's husband
after a thorough discussion of the procedural risks, benefits and
alternatives. All questions were addressed.

[Series 1: ir (id) (id)/(id) · 1 of 1 slices shown]
[im 1/1]
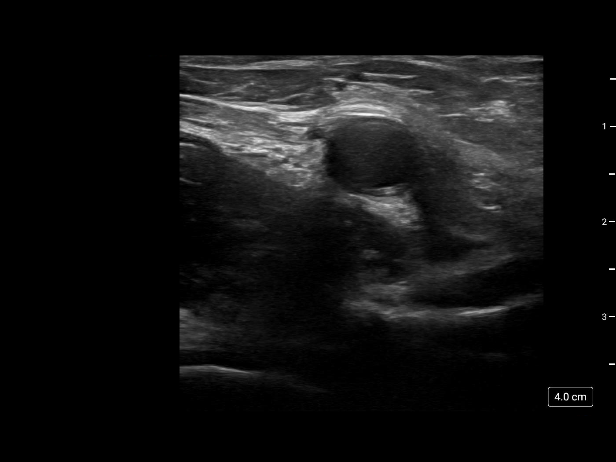

[Series 2: n roadmap · 2 acquisitions, 1 frame shown]
[im 1/2]
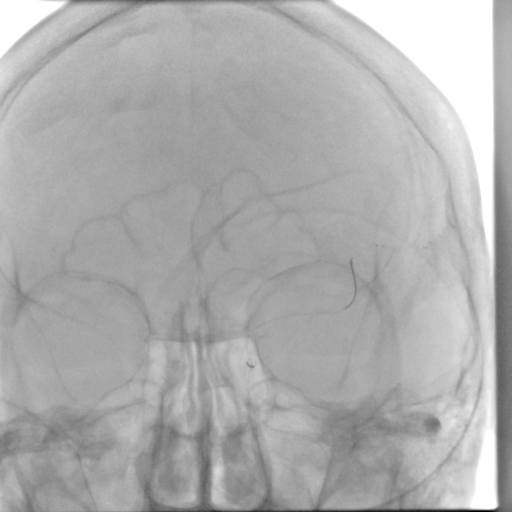

[Series 3: cerebral care 2 · 2 acquisitions, 2 frames shown (1 of 3)]
[im 1/2]
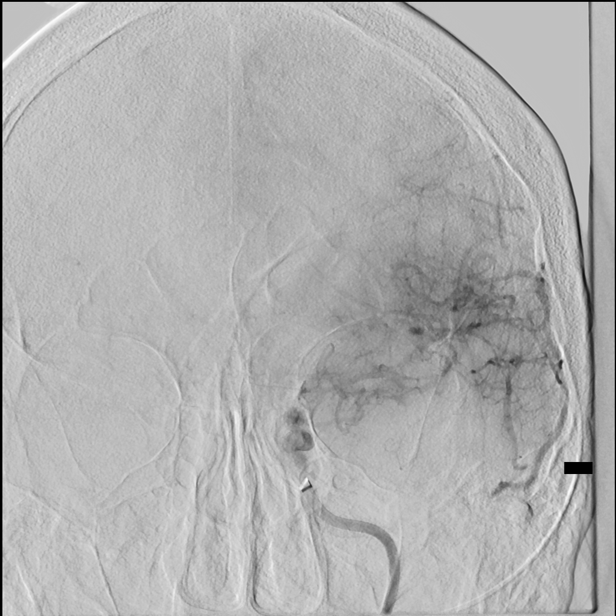
[im 2/2]
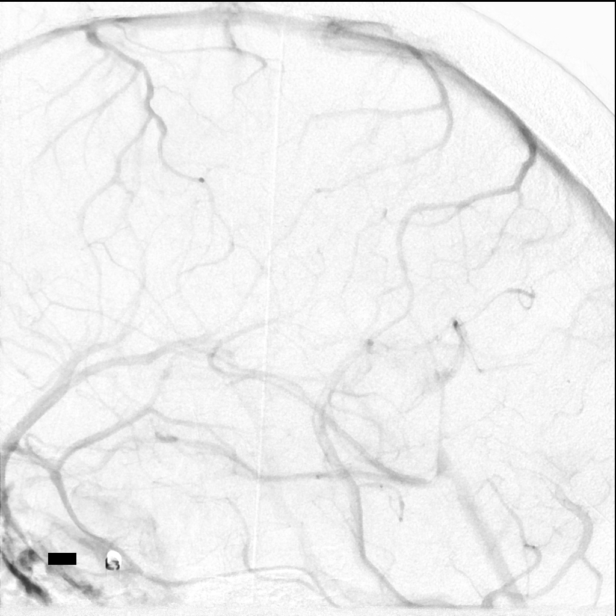

[Series 4: fl neuro n · 2 acquisitions, 1 frame shown]
[im 1/2]
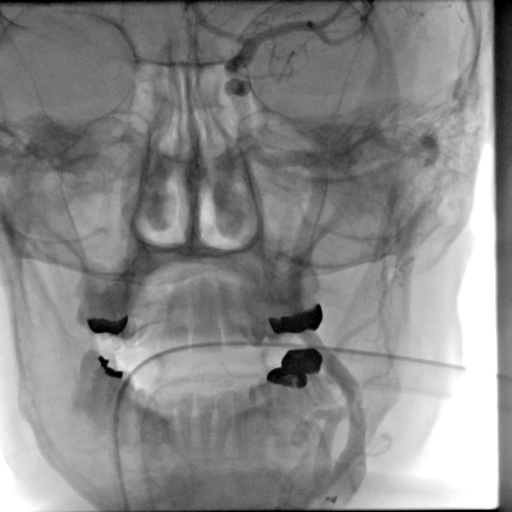

[Series 5: (id) head · 2 acquisitions, 3 frames shown]
[im 1/2]
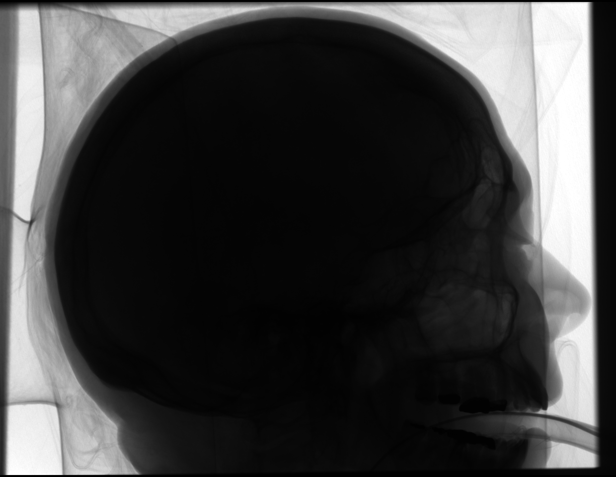
[im 2/2]
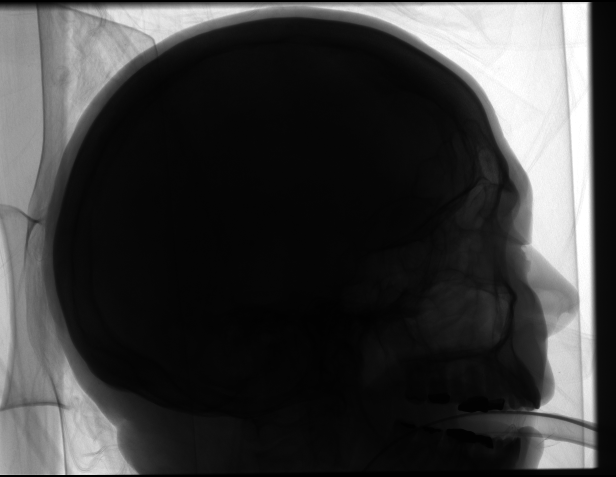
[im 2/2]
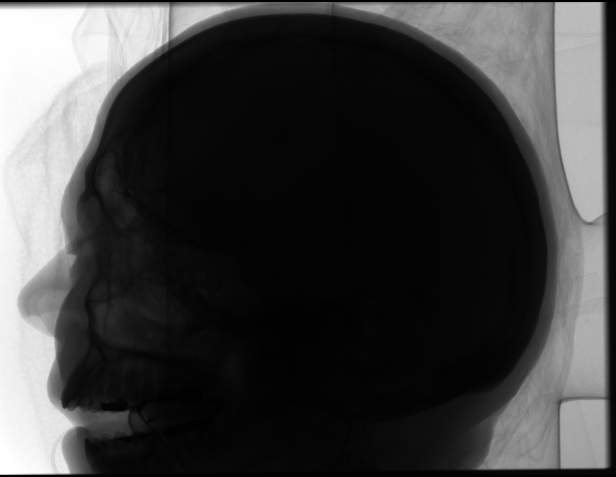

[Series 7: cerebral care 2 · 2 acquisitions, 1 frame shown (2 of 3)]
[im 2/2]
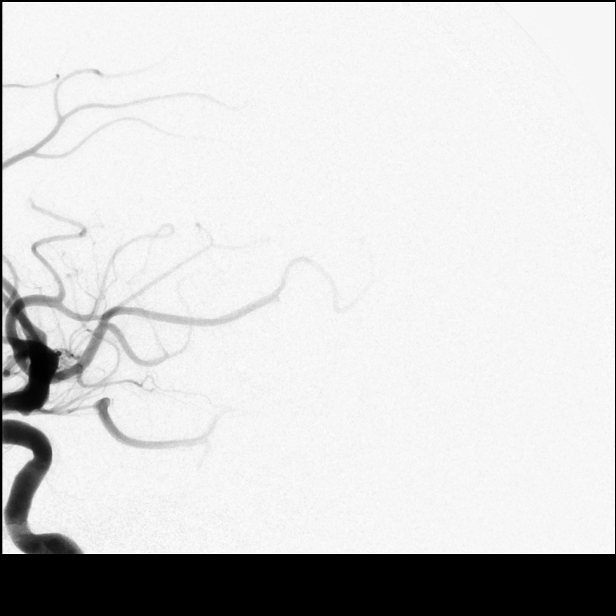

[Series 8: cerebral care 2 · 1 of 11 frames shown (3 of 3)]
[frame 2/11]
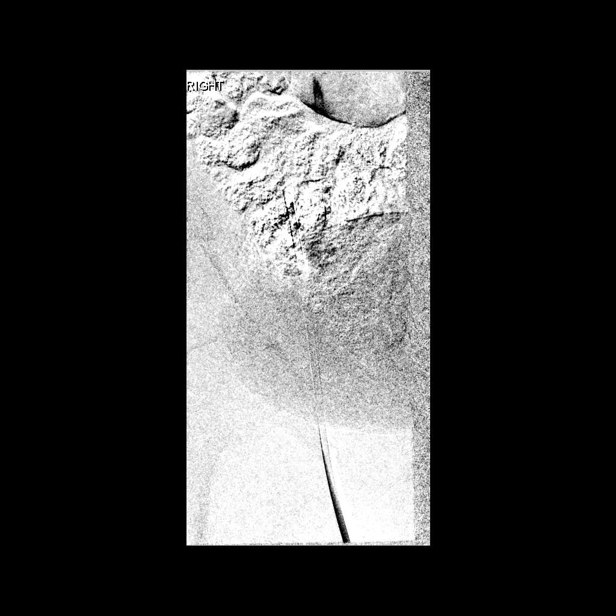

[Series 300: ir percutaneous art thrombectomy/infusio · 3 of 13 slices shown]
[im 3/13]
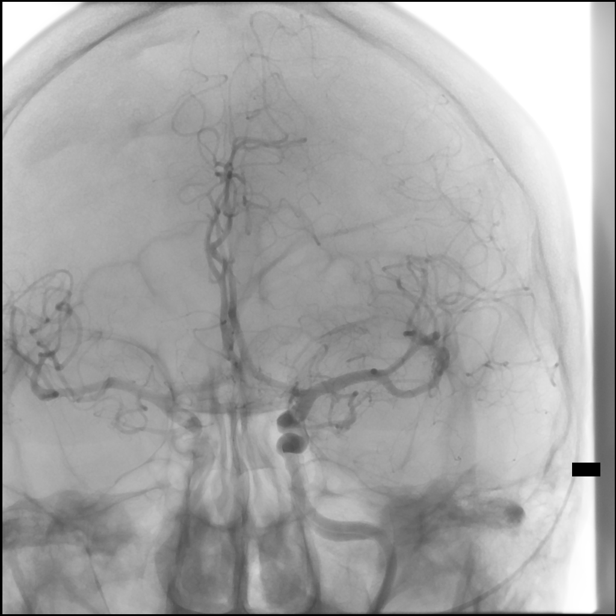
[im 7/13]
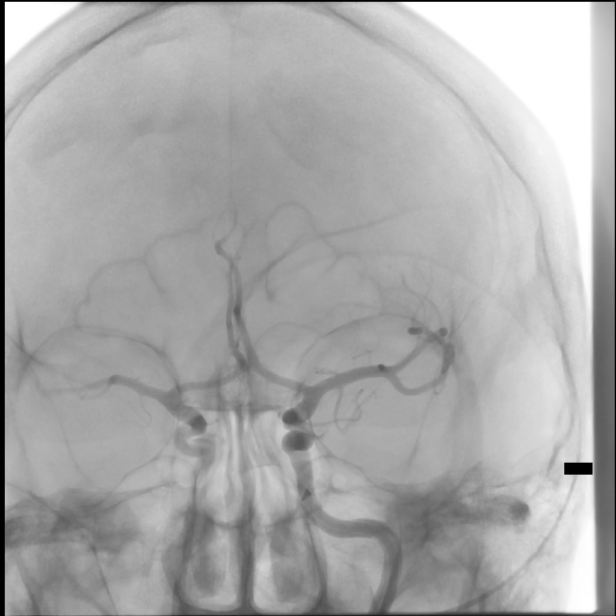
[im 13/13]
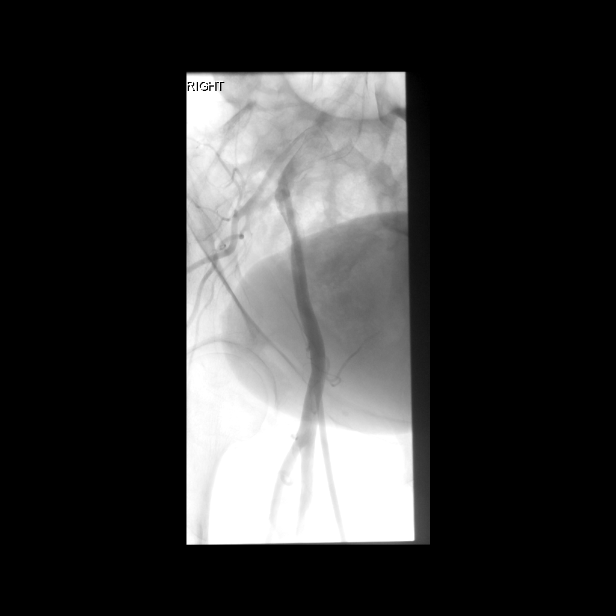

[13 of 24 positions shown; findings below may reference images not displayed]

MEDICATIONS:
No antibiotics administered.

ANESTHESIA/SEDATION:
The procedure was performed under general anesthesia.

CONTRAST:  42 mL of Omnipaque 300 milligram/mL

FLUOROSCOPY TIME:  Fluoroscopy Time: 9 minutes 24 seconds (476 mGy).

COMPLICATIONS:
SIR LEVEL B - Normal therapy, includes overnight admission for
observation.
Maximal Sterile Barrier Technique was utilized including caps, mask,
sterile gowns, sterile gloves, sterile drape, hand hygiene and skin
antiseptic. A timeout was performed prior to the initiation of the
procedure.

The right groin was prepped and draped in the usual sterile fashion.
Using a micropuncture kit and the modified Seldinger technique,
access was gained to the right common femoral artery and an 8 French
sheath was placed. Real-time ultrasound guidance was utilized for
vascular access including the acquisition of a permanent ultrasound
image documenting patency of the accessed vessel.

Under fluoroscopy, a Zoom 88 guide catheter was navigated over a 6
ODEDARA 2 catheter and a 0.035" Terumo Glidewire into the
aortic arch. The catheter was placed into the left common carotid
artery and then advanced into the left internal carotid artery. The
inner catheter was removed. Frontal and lateral angiograms of the
head were obtained.
FINDINGS: 1. Normal caliber of the right common femoral artery, adequate for
vascular access.
2. Occlusion of a left M3/MCA inferior division branch.

PROCEDURE:
Under biplane roadmap, a Zoom 35 aspiration catheter was navigated
over an Aristotle 14 microguidewire into the cavernous segment of
the right ICA. The aspiration catheter was then advanced to the
distal left M2/MCA inferior division branch. However, catheter did
not track to the level of occlusion. The catheter was subsequently
removed. Then, a phenom 21 microcatheter was navigated over an
Aristotle 14 micro guidewire into the left M3/MCA superior division
branch, past the level of occlusion. Then, a 3 mm solitaire stent
retriever was deployed across the occluded segment. The device was
allowed to intercalated with the clot for 3 minute. Then, the
catheter was gently retracted while continuous aspiration was
applied to the guide catheter.

Left ICA angiograms showed recanalization of the occluded segment
with normal flow in the main branch and slow flow in a side branch
(TICI 2C).

Flat panel CT of the head was obtained and post processed in a
separate workstation with concurrent attending physician
supervision. Selected images were sent to PACS. Trace subarachnoid
hemorrhage versus contrast extravasation noted in the left sylvian
fissure.

Follow-up left ICA angiograms with frontal and lateral views of the
head showed resolution of slow flow in the side branch now with
brisk contrast opacification with slow flow in the main branch
likely related to basal spasm. Decision made not to treat vasospasm
due to small subarachnoid hemorrhage seen on CT.

The catheter was subsequently withdrawn.

A right common femoral artery angiogram was obtained with right
anterior oblique view. The puncture is at the level of the mid right
common femoral artery which has normal caliber, adequate for closure
device. The femoral sheath was exchanged over the wire for a
Perclose ProGlide which was utilized for access closure. Immediate
hemostasis was achieved.
IMPRESSION: Successful mechanical thrombectomy performed for treatment of a left
M3/MCA occlusion. Final angiogram showed vasospasm with delayed flow
to the branch. ODEDARA subarachnoid hemorrhage versus
contrast extravasation on postprocedural flat panel head CT.

PLAN:
1. Transferred to ICU for continued monitoring.
2. Head CT in 3 hours to evaluate for stability of contrast
extravasation/subarachnoid hemorrhage.
3. Strict blood pressure control 120-140 mm Hg.

## 2020-12-01 SURGERY — IR WITH ANESTHESIA
Anesthesia: Choice

## 2020-12-01 MED ORDER — IOHEXOL 350 MG/ML SOLN
100.0000 mL | Freq: Once | INTRAVENOUS | Status: AC | PRN
  Administered 2020-12-01: 100 mL via INTRAVENOUS

## 2020-12-01 MED ORDER — METHYLPREDNISOLONE SODIUM SUCC 125 MG IJ SOLR
INTRAMUSCULAR | Status: AC
  Administered 2020-12-01: 125 mg
  Filled 2020-12-01: qty 2

## 2020-12-01 MED ORDER — THIAMINE HCL 100 MG PO TABS: 100.0000 mg | ORAL_TABLET | Freq: Every day | ORAL | Status: DC

## 2020-12-01 MED ORDER — CLEVIDIPINE BUTYRATE 0.5 MG/ML IV EMUL
0.0000 mg/h | INTRAVENOUS | Status: DC
  Administered 2020-12-01: 14 mg/h via INTRAVENOUS
  Administered 2020-12-02: 2 mg/h via INTRAVENOUS
  Filled 2020-12-01 (×2): qty 50

## 2020-12-01 MED ORDER — LABETALOL HCL 5 MG/ML IV SOLN
INTRAVENOUS | Status: AC
  Filled 2020-12-01: qty 4

## 2020-12-01 MED ORDER — CLEVIDIPINE BUTYRATE 0.5 MG/ML IV EMUL
INTRAVENOUS | Status: DC | PRN
  Administered 2020-12-01: 4 mg/h via INTRAVENOUS

## 2020-12-01 MED ORDER — VITAMIN D 25 MCG (1000 UNIT) PO TABS
1000.0000 [IU] | ORAL_TABLET | Freq: Every day | ORAL | Status: DC
  Administered 2020-12-03 – 2020-12-07 (×5): 1000 [IU] via ORAL
  Filled 2020-12-01 (×6): qty 1

## 2020-12-01 MED ORDER — METHYLPREDNISOLONE ACETATE 40 MG/ML IJ SUSP
INTRAMUSCULAR | Status: AC
  Filled 2020-12-01: qty 1

## 2020-12-01 MED ORDER — ESMOLOL HCL 100 MG/10ML IV SOLN
INTRAVENOUS | Status: DC | PRN
  Administered 2020-12-01: 20 mg via INTRAVENOUS
  Administered 2020-12-01: 30 mg via INTRAVENOUS
  Administered 2020-12-01: 20 mg via INTRAVENOUS
  Administered 2020-12-01: 30 mg via INTRAVENOUS

## 2020-12-01 MED ORDER — CLEVIDIPINE BUTYRATE 0.5 MG/ML IV EMUL
0.0000 mg/h | INTRAVENOUS | Status: DC
  Administered 2020-12-01: 18 mg/h via INTRAVENOUS

## 2020-12-01 MED ORDER — HYDROCORTISONE SOD SUC (PF) 250 MG IJ SOLR
200.0000 mg | Freq: Once | INTRAMUSCULAR | Status: DC
  Filled 2020-12-01: qty 200

## 2020-12-01 MED ORDER — ONDANSETRON HCL 4 MG/2ML IJ SOLN: 4.0000 mg | Freq: Four times a day (QID) | INTRAMUSCULAR | Status: DC | PRN

## 2020-12-01 MED ORDER — LIDOCAINE 2% (20 MG/ML) 5 ML SYRINGE
INTRAMUSCULAR | Status: DC | PRN
  Administered 2020-12-01: 60 mg via INTRAVENOUS

## 2020-12-01 MED ORDER — SODIUM CHLORIDE 0.9 % IV SOLN: Freq: Once | INTRAVENOUS | Status: DC

## 2020-12-01 MED ORDER — ACETAMINOPHEN 325 MG PO TABS: 650.0000 mg | ORAL_TABLET | ORAL | Status: DC | PRN

## 2020-12-01 MED ORDER — METHYLPREDNISOLONE SODIUM SUCC 40 MG IJ SOLR: 40.0000 mg | Freq: Once | INTRAMUSCULAR | Status: DC

## 2020-12-01 MED ORDER — DIPHENHYDRAMINE HCL 50 MG/ML IJ SOLN: 50.0000 mg | Freq: Once | INTRAMUSCULAR | Status: AC

## 2020-12-01 MED ORDER — TENECTEPLASE FOR STROKE
0.2500 mg/kg | PACK | Freq: Once | INTRAVENOUS | Status: AC
  Administered 2020-12-01: 14 mg via INTRAVENOUS

## 2020-12-01 MED ORDER — PANTOPRAZOLE SODIUM 40 MG IV SOLR
40.0000 mg | Freq: Every day | INTRAVENOUS | Status: DC
  Administered 2020-12-01: 40 mg via INTRAVENOUS
  Filled 2020-12-01: qty 40

## 2020-12-01 MED ORDER — SUGAMMADEX SODIUM 200 MG/2ML IV SOLN
INTRAVENOUS | Status: DC | PRN
  Administered 2020-12-01: 150 mg via INTRAVENOUS

## 2020-12-01 MED ORDER — LACTATED RINGERS IV SOLN: INTRAVENOUS | Status: DC | PRN

## 2020-12-01 MED ORDER — TRANEXAMIC ACID-NACL 1000-0.7 MG/100ML-% IV SOLN
1000.0000 mg | INTRAVENOUS | Status: AC
  Administered 2020-12-01: 1000 mg via INTRAVENOUS
  Filled 2020-12-01: qty 100

## 2020-12-01 MED ORDER — DIPHENHYDRAMINE HCL 25 MG PO CAPS: 50.0000 mg | ORAL_CAPSULE | Freq: Once | ORAL | Status: AC

## 2020-12-01 MED ORDER — LABETALOL HCL 5 MG/ML IV SOLN
5.0000 mg | Freq: Once | INTRAVENOUS | Status: AC
  Administered 2020-12-01: 5 mg via INTRAVENOUS

## 2020-12-01 MED ORDER — STROKE: EARLY STAGES OF RECOVERY BOOK: Freq: Once | Status: DC

## 2020-12-01 MED ORDER — PHENYLEPHRINE HCL-NACL 20-0.9 MG/250ML-% IV SOLN
INTRAVENOUS | Status: DC | PRN
  Administered 2020-12-01: 40 ug/min via INTRAVENOUS

## 2020-12-01 MED ORDER — INSULIN ASPART 100 UNIT/ML IJ SOLN
0.0000 [IU] | INTRAMUSCULAR | Status: DC
  Administered 2020-12-01 – 2020-12-02 (×3): 2 [IU] via SUBCUTANEOUS
  Administered 2020-12-02 – 2020-12-04 (×6): 1 [IU] via SUBCUTANEOUS

## 2020-12-01 MED ORDER — ACETAMINOPHEN 160 MG/5ML PO SOLN: 650.0000 mg | ORAL | Status: DC | PRN

## 2020-12-01 MED ORDER — PROPOFOL 10 MG/ML IV BOLUS
INTRAVENOUS | Status: DC | PRN
  Administered 2020-12-01: 20 mg via INTRAVENOUS
  Administered 2020-12-01: 150 mg via INTRAVENOUS

## 2020-12-01 MED ORDER — ROCURONIUM BROMIDE 10 MG/ML (PF) SYRINGE
PREFILLED_SYRINGE | INTRAVENOUS | Status: DC | PRN
  Administered 2020-12-01: 50 mg via INTRAVENOUS

## 2020-12-01 MED ORDER — SODIUM CHLORIDE 0.9 % IV SOLN: INTRAVENOUS | Status: AC

## 2020-12-01 MED ORDER — SENNOSIDES-DOCUSATE SODIUM 8.6-50 MG PO TABS: 1.0000 | ORAL_TABLET | Freq: Every evening | ORAL | Status: DC | PRN

## 2020-12-01 MED ORDER — CHLORHEXIDINE GLUCONATE CLOTH 2 % EX PADS
6.0000 | MEDICATED_PAD | Freq: Every day | CUTANEOUS | Status: DC
  Administered 2020-12-02 – 2020-12-07 (×6): 6 via TOPICAL

## 2020-12-01 MED ORDER — FENTANYL CITRATE (PF) 250 MCG/5ML IJ SOLN
INTRAMUSCULAR | Status: DC | PRN
  Administered 2020-12-01: 100 ug via INTRAVENOUS

## 2020-12-01 MED ORDER — ACETAMINOPHEN 650 MG RE SUPP: 650.0000 mg | RECTAL | Status: DC | PRN

## 2020-12-01 MED ORDER — PHENYLEPHRINE 40 MCG/ML (10ML) SYRINGE FOR IV PUSH (FOR BLOOD PRESSURE SUPPORT)
PREFILLED_SYRINGE | INTRAVENOUS | Status: DC | PRN
  Administered 2020-12-01: 120 ug via INTRAVENOUS

## 2020-12-01 MED ORDER — DIPHENHYDRAMINE HCL 50 MG/ML IJ SOLN
INTRAMUSCULAR | Status: AC
  Administered 2020-12-01: 50 mg via INTRAVENOUS
  Filled 2020-12-01: qty 1

## 2020-12-01 MED ORDER — ONDANSETRON HCL 4 MG/2ML IJ SOLN
INTRAMUSCULAR | Status: DC | PRN
  Administered 2020-12-01: 4 mg via INTRAVENOUS

## 2020-12-01 MED ORDER — LOSARTAN POTASSIUM 50 MG PO TABS: 25.0000 mg | ORAL_TABLET | Freq: Every day | ORAL | Status: DC

## 2020-12-01 MED ORDER — ACETAMINOPHEN 160 MG/5ML PO SOLN
650.0000 mg | ORAL | Status: DC | PRN
  Administered 2020-12-02: 650 mg
  Filled 2020-12-01: qty 20.3

## 2020-12-01 MED ORDER — IOHEXOL 300 MG/ML  SOLN: 100.0000 mL | Freq: Once | INTRAMUSCULAR | Status: DC | PRN

## 2020-12-01 MED ORDER — SUCCINYLCHOLINE CHLORIDE 200 MG/10ML IV SOSY
PREFILLED_SYRINGE | INTRAVENOUS | Status: DC | PRN
  Administered 2020-12-01: 120 mg via INTRAVENOUS

## 2020-12-01 MED ORDER — CLEVIDIPINE BUTYRATE 0.5 MG/ML IV EMUL
INTRAVENOUS | Status: AC
  Filled 2020-12-01: qty 50

## 2020-12-01 NOTE — Transfer of Care (Signed)
Immediate Anesthesia Transfer of Care Note  Patient: Jacqueline Bonilla  Procedure(s) Performed: IR WITH ANESTHESIA - CODE STROKE  Patient Location: PACU  Anesthesia Type:General  Level of Consciousness: awake and patient cooperative  Airway & Oxygen Therapy: Patient Spontanous Breathing and Patient connected to face mask oxygen  Post-op Assessment: Report given to RN, Post -op Vital signs reviewed and stable and Patient moving all extremities  Post vital signs: Reviewed and stable  Last Vitals:  Vitals Value Taken Time  BP    Temp    Pulse    Resp    SpO2      Last Pain:  Vitals:   12/01/20 1031  TempSrc: Oral         Complications: No notable events documented.

## 2020-12-01 NOTE — Progress Notes (Signed)
Assisted PACU RN with TXA administration and lab draws. Pt is irritable and wanting to refuse medications. SRN spoke with patient and explained the importance of the treatment. Pt stated "just do whatever you want to do" and left it at that. PACU given SRN number and encouraged to call with any questions or needs.   Champayne Kocian, Rande Brunt, RN  Stroke Response Nurse

## 2020-12-01 NOTE — H&P (Addendum)
NEUROHOSPITALIST HISTORY AND PHYSICAL   Requestig physician: Dr. Tyrone Nine  Reason for Consult: Acute onset of aphasia  History obtained from:  EMS and Chart     HPI:                                                                                                                                          Jacqueline Bonilla is an 65 y.o. 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 On arrival to the ED the patient was with dense receptive aphasia and mild extensor posturing of her RUE with mild RUE and RLE weakness.   Home medications include ASA. She is not on a blood thinner.   Past Medical History:  Diagnosis Date   Anemia    Anxiety    Depression    Dysmenorrhea    Endometriosis    Fibroid    Hypertension    Microhematuria    negative workup   Osteoporosis    Tachycardia    Thrombocytopenia [facility])     Past Surgical History:  Procedure Laterality Date   BREAST BIOPSY     CESAREAN SECTION     hysteroscopic resection     implantable loop recorder implant  10/21/2019   [employer] Reveal Linq model LNQ 22 ([place] [id number]) implantable loop recorder    Family History  Problem Relation Age of Onset   Cancer Father        non hodgkin lymphoma & skin   Heart attack Maternal Grandfather    Dementia Mother    Polymyositis Sister            Social History:  reports that she has never smoked. She has never used smokeless tobacco. She reports that she does not drink alcohol and does not use drugs.  Allergies  Allergen Reactions   Avelox [Moxifloxacin]     Ciprofloxacin Hives   Floraquin [Iodoquinol]     No cipro or others   Iohexol      Code: HIVES, Desc: HIVES S/P IVP MANY YRS AGO...NO PROBLEM W/O PREP TODAY/A.CALHOUN, Onset Date: [id number]    Levaquin [Levofloxacin]    Plavix [Clopidogrel] Diarrhea   Quinolones    Shellfish Allergy Itching    Itching under the chin. Described by patient but not seen by husband.    HOME MEDICATIONS:  No current facility-administered medications on file prior to encounter.   Current Outpatient Medications on File Prior to Encounter  Medication Sig Dispense Refill   Ascorbic Acid (VITAMIN C PO) Take 1 tablet by mouth daily.     aspirin EC 81 MG tablet Take 81 mg by mouth daily. Swallow whole.     Cholecalciferol (VITAMIN D3 GUMMIES PO) Take 1,000 mcg by mouth daily.     Huperzine Serrate A 1 % POWD by Does not apply route 3 (three) times a week.     losartan (COZAAR) 25 MG tablet TAKE 1 TABLET(25 MG) BY MOUTH DAILY 90 tablet 3   Multiple Vitamins-Minerals (ZINC PO) Take 1 tablet by mouth daily.     NATTOKINASE PO Take by mouth daily.     Thiamine HCl (VITAMIN B-1 PO) Take 1 tablet by mouth daily.       ROS:                                                                                                                                       Unable to obtain due to AMS.    Weight 56.8 kg, last menstrual period 01/02/2003.   General Examination:                                                                                                       Physical Exam  HEENT-  [place]/AT  Lungs- Respirations unlabored Extremities- No edema  Neurological Examination Mental Status: Awake and alert but appears confused. Dense receptive aphasia with inability to follow > 75% of commands. Unable to provide meaningful answers to questions. Speech is fragmentary and halting with frequent phonemic and  semantic paraphasias. Naming and repetition impaired. Fragmentary answers to orientation questions indicate an awareness of her location, the month and "Wed" for the day. She is able to recall her name.  Cranial Nerves: II: PERRL. Blinks to threat consistently on the left with inconsistent blink to threat to her right hemifield.  III,IV, VI: No ptosis. Leftward gaze preference; able to cross midline to right with some difficulty. No nystagmus.  V: Reacts to touch bilaterally VII: Smile symmetric VIII: Hearing intact to voice IX,X: No hypophonia XI: Head is midline XII: Midline tongue extension Motor: RUE 4/5 with delayed responses RLE 4+/5 with delayed responses LUE and LLE 5/5 RUE with mild drift; does not hit bed within 10 seconds Sensory: Decreased reactivity to right sided stimulation.  Deep Tendon Reflexes: 2+ bilateral brachioradialis and biceps. 3+ bilateral  patellae. 1+ bilateral achilles. Toes downgoing bilaterally Cerebellar: Unable to comprehend questions for testing of cerebellar function. Action tremor to RUE noted.  Gait: Unable to assess   Lab Results: Basic Metabolic Panel: Recent Labs  Lab 12/01/20 0957  NA 140  K 4.2  CL 103  GLUCOSE 131*  BUN 34*  CREATININE 1.10*    CBC: Recent Labs  Lab 12/01/20 0957  HGB 12.9  HCT 38.0    Cardiac Enzymes: No results for input(s): CKTOTAL, CKMB, CKMBINDEX, TROPONINI in the last 168 hours.  Lipid Panel: No results for input(s): CHOL, TRIG, HDL, CHOLHDL, VLDL, LDLCALC in the last 168 hours.  Imaging: No results found.   Assessment: 65 year old female with a PMHx of stroke presenting with acute onset of worsened right sided incoordination relative to baseline and new onset of receptive aphasia 1. Exam reveals dense receptive aphasia and mild right sided weakness 2. CT head: There is no acute intracranial hemorrhage or evidence of acute infarction. ASPECT score is 10. 3. 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 No hemodynamically significant stenosis in the neck. 4. CTP: Perfusion imaging demonstrates no core infarction and 16 mL of penumbra within the posterior left MCA territory. 5. Has listed allergies to Plavix (diarrhea), shellfish and iohexol (hives).   6. The benefit of information gained from contrast administration for CTA in the setting of acute ischemic stroke supersedes the potential risk of allergic reaction given that she will be premedicated. Premedicated for emergent CTA/CTP, per Radiology protocol for her listed iodine contrast allergy.  7. After comprehensive review of possible contraindications, she has no absolute contraindications to TNK administration. Patient is a TNK candidate. Discussed extensively the risks/benefits of TNK treatment vs. no treatment with the patient's husband, including risks of hemorrhage and death with TNK administration versus worse overall outcomes on average in patients within the TNK time window who are not administered TNK. The patient's aphasia precludes meaningful medical decision making on her part at this time. Overall benefits of TNK regarding long-term prognosis are felt to outweigh risks. The patient's husband expressed understanding and wish to proceed with TNK. Telephone consent witnessed by Stroke Response Nurse. 8 .The patient is a VIR candidate. Risks/benefits of the procedure were discussed extensively with patient's husband, including approximately 50% chance of significant improvement relative to an approximate 10% chance of subarachnoid hemorrhage with possibility of significant worsening including death. The patient's husband expressed understanding and provided informed consent to proceed with VIR. All questions answered.   9. Stroke risk factors: Prior strokes, anemia and HTN 10. Modified Rankin Score: 2   Plan: 1. Admitting to Neuro ICU.  2. Post-TNK order set to include  frequent neuro checks and BP management.  3. No antiplatelet medications or anticoagulants for at least 24 hours following TNK.  4. DVT prophylaxis with SCDs.  5. Will need to be started on a statin.  6. Will need escalation of antiplatelet therapy to DAPT if able to tolerate Plavix (has a history of diarrhea with this medication), if follow up CT at 24 hours is negative for hemorrhagic conversion. 7. Cardiac telemetry 8. TTE.  9. MRI brain.  10. PT/OT/Speech.  11. NPO until passes swallow evaluation.  12. Fasting lipid panel, HgbA1c 13. IVF 14. SSI    60 minutes spent in the emergent neurological evaluation and management of this critically ill patient.   Electronically signed: Dr. Kerney Elbe 12/01/2020, 10:02  AM

## 2020-12-01 NOTE — Progress Notes (Addendum)
PHARMACIST CODE STROKE RESPONSE  Notified to mix TNK at 1015 by Dr. Cheral Marker Delivered TNK to RN at 1018  TNK dose = 14 mg IV over 5 seconds  Issues/delays encountered (if applicable): Patient was finishing CT perfusion so 3 minute delay in administration but still at 5 minutes within notification time.  Antonietta Jewel, PharmD, [place] Clinical Pharmacist  Phone: [phone] 12/01/2020 10:21 AM  Please check AMION for all [place] phone numbers After 10:00 PM, call [facility] [phone]

## 2020-12-01 NOTE — ED Notes (Signed)
Pt went to CT, returned to room and proceeded to IR. Stroke nurse and this RN took pt to IR room, stroke RN gave report to IR staff. Pt's husband was at bedside and was given all of pt's belongings. Stroke RN took pt's husband to waiting room.

## 2020-12-01 NOTE — Procedures (Signed)
INTERVENTIONAL NEURORADIOLOGY BRIEF POSTPROCEDURE NOTE  DIAGNOSTIC CEREBRAL ANGIOGRAM AND MECHANICAL THROMBECTOMY  Attending: Dr. Erven Colla de Sindy Messing  Assistant: None.  Diagnosis: Left M3/MCA occlusion  Access site: RCFA, 11F  Access closure: Perclose prostyle  Anesthesia: GETA  Medication used: refer to anesthesia documentation.  Complications: Trace SAH vs contrast extravasation in the left Sylvian fissure  Estimated blood loss: 50 mL  Specimen: None.  Findings: Occlusion of a left M3/MCA angular branch. Mechanical thrombectomy performed with stent retriever with one pass (3 mm solitaire) with recanalization of the main branch with slow flow in a side branch (TICI2C). Trace SAH vs contrast extravasation noted in the left Sylvian fissure on post procedural flat panel CT.  PLAN: - Bed rest x 6 hours - SBP 120-140 mmHg - Head CT in 3 hours to evaluate for [facility] progression

## 2020-12-01 NOTE — Plan of Care (Addendum)
TXA given and fibrinogen level 293 normal range. Repeat CT at 5:45 pm showed stable SAH with possible additional overlapping contrast staining. Do not think we need cryoprecipitate at this time. BP goal < 140, continue close monitoring. Low threshold for repeat CT if any acute neuro changes. MRI and MRA ordered in am.   Rosalin Hawking, MD PhD Stroke Neurology 12/01/2020 5:55 PM

## 2020-12-01 NOTE — Progress Notes (Signed)
PT Cancellation Note  Patient Details Name: Jacqueline Bonilla MRN: [id number] DOB: 07-Apr-1955   Cancelled Treatment:    Reason Eval/Treat Not Completed: Active bedrest order. Pt s/p cerebral angiogram and mechanical thrombectomy around 11:45-12:00 with bedrest orders for x6 hours. Will plan to follow-up tomorrow as able.   Moishe Spice, PT, DPT Acute Rehabilitation Services  Pager: [phone] Office: [place] 12/01/2020, 3:18 PM

## 2020-12-01 NOTE — Anesthesia Procedure Notes (Signed)
Arterial Line Insertion Start/End12/01/2020 10:50 AM, 12/01/2020 11:00 AM Performed by: Lavell Luster, CRNA  Patient location: OR. Preanesthetic checklist: patient identified, IV checked, site marked, risks and benefits discussed, surgical consent, monitors and equipment checked, pre-op evaluation and timeout performed Left, radial was placed Catheter size: 20 G Hand hygiene performed , maximum sterile barriers used  and Seldinger technique used Allen's test indicative of satisfactory collateral circulation Attempts: 2 Procedure performed without using ultrasound guided technique. Following insertion, Biopatch and dressing applied. Post procedure assessment: normal  Patient tolerated the procedure well with no immediate complications.

## 2020-12-01 NOTE — Progress Notes (Signed)
Patient transported to PACU. Met by receiving RN. Report given to [facility]. Groin site assessed. Clean, dry and intact. No hematoma noted, soft to palpation. +1 palpable distal pulses intact.

## 2020-12-01 NOTE — Anesthesia Preprocedure Evaluation (Signed)
Anesthesia Evaluation  Patient identified by MRN, date of birth, ID band Patient confused    Reviewed: Allergy & Precautions, NPO status , Patient's Chart, lab work & pertinent test results, Unable to perform ROS - Chart review onlyPreop documentation limited or incomplete due to emergent nature of procedure.  Airway Mallampati: II  TM Distance: >3 FB Neck ROM: Full    Dental  (+) Teeth Intact, Dental Advisory Given   Pulmonary neg pulmonary ROS,    Pulmonary exam normal breath sounds clear to auscultation       Cardiovascular hypertension, Pt. on medications  Rhythm:Regular Rate:Tachycardia     Neuro/Psych PSYCHIATRIC DISORDERS Anxiety Depression CVA, Residual Symptoms    GI/Hepatic negative GI ROS, Neg liver ROS,   Endo/Other  negative endocrine ROS  Renal/GU negative Renal ROS     Musculoskeletal negative musculoskeletal ROS (+)   Abdominal   Peds  Hematology negative hematology ROS (+)   Anesthesia Other Findings Day of surgery medications reviewed with the patient.  Reproductive/Obstetrics                             Anesthesia Physical Anesthesia Plan  ASA: 3 and emergent  Anesthesia Plan: General   Post-op Pain Management:    Induction: Intravenous  PONV Risk Score and Plan: 3 and Dexamethasone and Ondansetron  Airway Management Planned: Oral ETT  Additional Equipment: Arterial line  Intra-op Plan:   Post-operative Plan: Extubation in OR  Informed Consent: I have reviewed the patients History and Physical, chart, labs and discussed the procedure including the risks, benefits and alternatives for the proposed anesthesia with the patient or authorized representative who has indicated his/her understanding and acceptance.     Only emergency history available  Plan Discussed with: CRNA  Anesthesia Plan Comments: (Pre-op completed after induction due to emergent nature  of procedure.)        Anesthesia Quick Evaluation

## 2020-12-01 NOTE — Plan of Care (Signed)
Discussed with Dr. Norma Fredrickson that pt recent CT repeat showed left SAH seems worsening post IR procedure. Will initiate TNK reversal. Will do type and cross stat, will give TXA 1g stat. Check fibrinogen level stat. And repeat CT head in 2-3 hours. If fibrinogen level low or repeat CT showed significant change, will initiate cryoprecipitate 2 pool (10 Uints). May consider repeat TXA if needed. Discussed also with RN and blood bank staff.   Rosalin Hawking, MD PhD Stroke Neurology 12/01/2020 4:00 PM

## 2020-12-01 NOTE — ED Provider Notes (Signed)
[facility] EMERGENCY DEPARTMENT Provider Note   CSN: [id number] Arrival date & time: 12/01/20  7494     History No chief complaint on file.   Jacqueline Bonilla is a 65 y.o. female  65 year old female brought in by EMS with concerns of stroke.  Patient woke up with right-sided coordination difficulties noted by her husband.  She was having trouble answering questions, recognizing common objects.  Reportedly she was able to answer questions, however the answers were completely unrelated.  Remainder of ROS limited due to acuity of condition.  Patient was brought to the CT straightaway. HPI     Past Medical History:  Diagnosis Date   Anemia    Anxiety    Depression    Dysmenorrhea    Endometriosis    Fibroid    Hypertension    Microhematuria    negative workup   Osteoporosis    Tachycardia    Thrombocytopenia [facility])     Patient Active Problem List   Diagnosis Date Noted   Dizziness 08/30/2020   History of stroke 07/14/2020   Azotemia 07/11/2020   Anemia 07/11/2020   CVA (cerebral vascular accident) ([place]) 06/28/2020   Cerebral infarction ([place]) 06/25/2020   Acute left-sided weakness 06/24/2020   Vascular dementia with behavior disturbance 10/13/2019   Vascular dementia without behavioral disturbance ([place]) 07/15/2019   Cerebrovascular accident ([place]) 06/25/2019   Brain fog 04/22/2019   History of COVID-19 04/22/2019   Essential hypertension 03/03/2019   Hypertensive urgency 03/03/2019    Past Surgical History:  Procedure Laterality Date   BREAST BIOPSY     CESAREAN SECTION     hysteroscopic resection     implantable loop recorder implant  10/21/2019   [employer] Reveal Linq model LNQ 22 (SN [id number]) implantable loop recorder     OB History     Gravida  1   Para  1   Term  1   Preterm      AB      Living  1      SAB      IAB      Ectopic      Multiple      Live Births  1           Family History  Problem Relation  Age of Onset   Cancer Father        non hodgkin lymphoma & skin   Heart attack Maternal Grandfather    Dementia Mother    Polymyositis Sister     Social History   Tobacco Use   Smoking status: Never   Smokeless tobacco: Never  Vaping Use   Vaping Use: Never used  Substance Use Topics   Alcohol use: Never   Drug use: Never    Home Medications Prior to Admission medications   Medication Sig Start Date End Date Taking? Authorizing Provider  Ascorbic Acid (VITAMIN C PO) Take 1 tablet by mouth daily.    [provider]  aspirin EC 81 MG tablet Take 81 mg by mouth daily. Swallow whole.    [provider]  Cholecalciferol (VITAMIN D3 GUMMIES PO) Take 1,000 mcg by mouth daily.    [provider]  Huperzine Serrate A 1 % POWD by Does not apply route 3 (three) times a week.    [provider]  losartan (COZAAR) 25 MG tablet TAKE 1 TABLET(25 MG) BY MOUTH DAILY 07/21/20   Horald Pollen, MD  Multiple Vitamins-Minerals (ZINC PO) Take 1  tablet by mouth daily.    [provider]  NATTOKINASE PO Take by mouth daily.    [provider]  Thiamine HCl (VITAMIN B-1 PO) Take 1 tablet by mouth daily.    [provider]    Allergies    Avelox [moxifloxacin], Ciprofloxacin, Floraquin [iodoquinol], Iohexol, Levaquin [levofloxacin], Plavix [clopidogrel], Quinolones, and Shellfish allergy  Review of Systems   Review of Systems  Unable to perform ROS: Acuity of condition   Physical Exam Updated Vital Signs Wt 56.8 kg   LMP 01/02/2003   BMI 21.49 kg/m   Physical Exam Vitals and nursing note reviewed.  HENT:     Head: Atraumatic.     Nose: Nose normal.     Mouth/Throat:     Mouth: Mucous membranes are moist.  Cardiovascular:     Rate and Rhythm: Normal rate.  Pulmonary:     Effort: Pulmonary effort is normal. No respiratory distress.  Abdominal:     General: There is no distension.  Musculoskeletal:        General:  Normal range of motion.     Cervical back: Normal range of motion.  Skin:    General: Skin is warm.     Capillary Refill: Capillary refill takes less than 2 seconds.  Neurological:     Mental Status: She is alert.     Coordination: Coordination abnormal.     Comments: Right sided coordination deficit. Aphasia. Patient is sitting up alert, responding inappropriately to questions  Psychiatric:        Mood and Affect: Mood normal.    ED Results / Procedures / Treatments   Labs (all labs ordered are listed, but only abnormal results are displayed) Labs Reviewed  CBG MONITORING, ED - Abnormal; Notable for the following components:      Result Value   Glucose-Capillary 142 (*)    All other components within normal limits  I-STAT CHEM 8, ED - Abnormal; Notable for the following components:   BUN 34 (*)    Creatinine, Ser 1.10 (*)    Glucose, Bld 131 (*)    All other components within normal limits  CBC  DIFFERENTIAL  PROTIME-INR  APTT  COMPREHENSIVE METABOLIC PANEL    EKG None  Radiology CT HEAD CODE STROKE WO CONTRAST  Result Date: 12/01/2020 CLINICAL DATA:  Code stroke.  Aphasia, right-sided weakness EXAM: CT HEAD WITHOUT CONTRAST TECHNIQUE: Contiguous axial images were obtained from the base of the skull through the vertex without intravenous contrast. COMPARISON:  06/24/2020 FINDINGS: Brain: There is no acute intracranial hemorrhage, mass effect, or edema. No new loss of gray-white differentiation. Prominence of the ventricles and sulci reflects similar parenchymal volume loss. Scattered chronic cortical infarcts. Small chronic infarct of the body of the left caudate. Small chronic right greater than left cerebellar infarcts. No extra-axial collection. Vascular: No hyperdense vessel. There is intracranial atherosclerotic calcification at the skull base. Skull: Unremarkable. Sinuses/Orbits: No acute abnormality. Other: Mastoid air cells are clear. ASPECTS ([place] Stroke Program  Early CT Score) - Ganglionic level infarction (caudate, lentiform nuclei, internal capsule, insula, M1-M3 cortex): 7 - Supraganglionic infarction (M4-M6 cortex): 3 Total score (0-10 with 10 being normal): 10 IMPRESSION: There is no acute intracranial hemorrhage or evidence of acute infarction. ASPECT score is 10. These results were communicated to Dr. Cheral Marker at 10:11 am on 12/01/2020 by text page via the [facility] messaging system. Electronically Signed   By: Macy Mis M.D.   On: 12/01/2020 10:12    Procedures .Critical  Care E&M Performed by: Tonye Pearson, PA-C  Critical care provider statement:    Critical care time (minutes):  35   Critical care start time:  12/01/2020 9:50 AM   Critical care end time:  12/01/2020 10:25 AM   Critical care was necessary to treat or prevent imminent or life-threatening deterioration of the following conditions:  CNS failure or compromise   Critical care was time spent personally by me on the following activities:  Ordering and review of radiographic studies, ordering and review of laboratory studies, examination of patient and review of old charts   I assumed direction of critical care for this patient from another provider in my specialty: no     Care discussed with: admitting provider   After initial E/M assessment, critical care services were subsequently performed that were exclusive of separately billable procedures or treatment.     Medications Ordered in ED Medications  diphenhydrAMINE (BENADRYL) capsule 50 mg (has no administration in time range)    Or  diphenhydrAMINE (BENADRYL) injection 50 mg (has no administration in time range)  methylPREDNISolone sodium succinate (SOLU-MEDROL) 40 mg/mL injection 40 mg (has no administration in time range)  diphenhydrAMINE (BENADRYL) 50 MG/ML injection (has no administration in time range)  methylPREDNISolone sodium succinate (SOLU-MEDROL) 125 mg/2 mL injection (has no administration in time range)   methylPREDNISolone acetate (DEPO-MEDROL) 40 MG/ML injection (has no administration in time range)  tenecteplase (TNKASE) injection for Stroke 14 mg (has no administration in time range)    ED Course  I have reviewed the triage vital signs and the nursing notes.  Pertinent labs & imaging results that were available during my care of the patient were reviewed by me and considered in my medical decision making (see chart for details).    MDM Rules/Calculators/A&P                         65 year old female brought in by EMS with concerns as described above.  Level 5 caveat acuity of condition.  CT head without evidence of acute infarct or hemorrhage. CT angio shows occlusion of the distal left M2 MCA branch.  DKA administered in CT.  Patient transferred straight to ICU who will determine ultimate treatment and disposition. Final Clinical Impression(s) / ED Diagnoses Final diagnoses:  Unknown when suspected stroke patient was last well    Rx / DC Orders ED Discharge Orders     None        Tonye Pearson, PA-C 12/01/20 [place], DO 12/02/20 0701

## 2020-12-01 NOTE — ED Notes (Signed)
Sending covid swab down

## 2020-12-01 NOTE — Anesthesia Procedure Notes (Signed)
Procedure Name: Intubation Date/Time: 12/01/2020 10:52 AM Performed by: Amadeo Garnet, CRNA Pre-anesthesia Checklist: Patient identified, Emergency Drugs available, Suction available and Patient being monitored Patient Re-evaluated:Patient Re-evaluated prior to induction Oxygen Delivery Method: Circle system utilized Preoxygenation: Pre-oxygenation with 100% oxygen Induction Type: IV induction, Rapid sequence and Cricoid Pressure applied Laryngoscope Size: Glidescope and 4 Grade View: Grade I Tube type: Oral Tube size: 7.0 mm Number of attempts: 2 Airway Equipment and Method: Stylet and Oral airway Placement Confirmation: ETT inserted through vocal cords under direct vision, positive ETCO2 and breath sounds checked- equal and bilateral Secured at: 22 cm Tube secured with: Tape Dental Injury: Teeth and Oropharynx as per pre-operative assessment  Difficulty Due To: Difficult Airway- due to anterior larynx Comments: DL x1 MAC4  unable to visualize vocal cords/glottic opening; DL x1 glidescope 3, Grade 1 view, ETT passed atraumatically

## 2020-12-01 NOTE — Code Documentation (Signed)
Stroke Response Nurse Documentation Code Documentation  Jacqueline Bonilla is a 65 y.o. female arriving to [facility] ED via [facility] on 12/01/20 with past medical hx of CVA, HTN. On aspirin 81 mg daily. Code stroke was activated by [facility].   Patient from home where she was LKW at 0900 and now complaining of word finding difficulties and right sided weakness and difficulty with coordination. Pt was home with her husband when he noticed at 0900 that she couldn't get her words out and was having some worsening weakness on the right (deficit from the previous stroke) and trouble with coordination.     Stroke team at the bedside on patient arrival. Labs drawn and patient cleared for CT by Dr. Tyrone Nine. Patient to CT with team. NIHSS 6, see documentation for details and code stroke times. Patient with disoriented, right gaze preference , right hemianopia, and Expressive aphasia  on exam. The following imaging was completed: CT, CTA head and neck, CTP. Delay in scans as patient had to be pre-medicated for a contrast allergy. Patient is a candidate for IV Thrombolytic due to significant deficits. TNK admin was discussed with pt's husband over the phone by Dr. Cheral Marker. TNK given at 1020. Patient is a candidate for IR due to LVO. IR MD discussed benefits and risks with family at the bedside and the patient. Pt's husband signed consent for thrombectomy. Pt was placed on the monitor, pulses marked, and taken to IR suite. Report given to IR RN and family member taken to waiting area on the first floor.  4N made aware of bed need.   Care/Plan: TNK @ 1848, IR for thrombectomy, admission to neuro ICU if bed available.    Bedside handoff with ED RN Peter Congo and IR RN.   Marianny Goris, Rande Brunt  Stroke Response RN

## 2020-12-01 NOTE — ED Triage Notes (Signed)
Pt's husband noticed change in coordination approx 0900, different from previous stroke symptoms. R coordination and aphasia. BIB [facility], taken to CT 3. EMS VS160/88, HR 86, 153 CBG

## 2020-12-01 NOTE — Anesthesia Postprocedure Evaluation (Signed)
Anesthesia Post Note  Patient: Jacqueline Bonilla  Procedure(s) Performed: IR WITH ANESTHESIA - CODE STROKE     Patient location during evaluation: PACU Anesthesia Type: General Level of consciousness: awake and alert Pain management: pain level controlled Vital Signs Assessment: post-procedure vital signs reviewed and stable Respiratory status: spontaneous breathing, nonlabored ventilation, respiratory function stable and patient connected to nasal cannula oxygen Cardiovascular status: blood pressure returned to baseline, stable and tachycardic Postop Assessment: no apparent nausea or vomiting Anesthetic complications: no   No notable events documented.  Last Vitals:  Vitals:   12/01/20 1525 12/01/20 1530  BP: 119/72 (!) 119/7  Pulse: (!) 113   Resp: 10   Temp:    SpO2: 99%     Last Pain:  Vitals:   12/01/20 1540  TempSrc:   PainSc: 0-No pain                 Catalina Gravel

## 2020-12-02 ENCOUNTER — Inpatient Hospital Stay (HOSPITAL_COMMUNITY): Payer: Medicare HMO

## 2020-12-02 DIAGNOSIS — Z9282 Status post administration of tPA (rtPA) in a different facility within the last 24 hours prior to admission to current facility: Secondary | ICD-10-CM

## 2020-12-02 DIAGNOSIS — Z8673 Personal history of transient ischemic attack (TIA), and cerebral infarction without residual deficits: Secondary | ICD-10-CM

## 2020-12-02 DIAGNOSIS — R569 Unspecified convulsions: Secondary | ICD-10-CM

## 2020-12-02 DIAGNOSIS — I609 Nontraumatic subarachnoid hemorrhage, unspecified: Secondary | ICD-10-CM

## 2020-12-02 LAB — LIPID PANEL
Cholesterol: 153 mg/dL (ref 0–200)
HDL: 48 mg/dL (ref >40)
LDL Cholesterol: 89 mg/dL (ref 0–99)
Total CHOL/HDL Ratio: 3.2 RATIO
Triglycerides: 81 mg/dL (ref <150)
VLDL: 16 mg/dL (ref 0–40)

## 2020-12-02 LAB — CBC
HCT: 34.4 % — ABNORMAL LOW (ref 36.0–46.0)
Hemoglobin: 11.2 g/dL — ABNORMAL LOW (ref 12.0–15.0)
MCH: 30 pg (ref 26.0–34.0)
MCHC: 32.6 g/dL (ref 30.0–36.0)
MCV: 92.2 fL (ref 80.0–100.0)
Platelets: 214 10*3/uL (ref 150–400)
RBC: 3.73 MIL/uL — ABNORMAL LOW (ref 3.87–5.11)
RDW: 13.4 % (ref 11.5–15.5)
WBC: 11.3 10*3/uL — ABNORMAL HIGH (ref 4.0–10.5)
nRBC: 0 % (ref 0.0–0.2)

## 2020-12-02 LAB — BASIC METABOLIC PANEL
Anion gap: 8 (ref 5–15)
BUN: 19 mg/dL (ref 8–23)
CO2: 24 mmol/L (ref 22–32)
Calcium: 8.7 mg/dL — ABNORMAL LOW (ref 8.9–10.3)
Chloride: 105 mmol/L (ref 98–111)
Creatinine, Ser: 0.87 mg/dL (ref 0.44–1.00)
GFR, Estimated: >60 mL/min (ref >60)
Glucose, Bld: 118 mg/dL — ABNORMAL HIGH (ref 70–99)
Potassium: 3.6 mmol/L (ref 3.5–5.1)
Sodium: 137 mmol/L (ref 135–145)

## 2020-12-02 LAB — MAGNESIUM
Magnesium: 2 mg/dL (ref 1.7–2.4)
Magnesium: 1.9 mg/dL (ref 1.7–2.4)

## 2020-12-02 LAB — HEMOGLOBIN A1C
Hgb A1c MFr Bld: 5.7 % — ABNORMAL HIGH (ref 4.8–5.6)
Mean Plasma Glucose: 116.89 mg/dL

## 2020-12-02 LAB — PHOSPHORUS
Phosphorus: 3.5 mg/dL (ref 2.5–4.6)
Phosphorus: 3.4 mg/dL (ref 2.5–4.6)

## 2020-12-02 LAB — GLUCOSE, CAPILLARY
Glucose-Capillary: 107 mg/dL — ABNORMAL HIGH (ref 70–99)
Glucose-Capillary: 122 mg/dL — ABNORMAL HIGH (ref 70–99)
Glucose-Capillary: 123 mg/dL — ABNORMAL HIGH (ref 70–99)
Glucose-Capillary: 157 mg/dL — ABNORMAL HIGH (ref 70–99)
Glucose-Capillary: 172 mg/dL — ABNORMAL HIGH (ref 70–99)
Glucose-Capillary: 88 mg/dL (ref 70–99)
Glucose-Capillary: 96 mg/dL (ref 70–99)

## 2020-12-02 IMAGING — MR MR MRA HEAD W/O CM
12 of 14 series · 34 of 48 positions shown · non-contrast
Comparison: Same-day noncontrast head CT, brain MRI [DATE]

CLINICAL DATA: Acute onset of aphasia, history of strokes

EXAM:
MRI HEAD WITHOUT CONTRAST
MRA HEAD WITHOUT CONTRAST
TECHNIQUE: Multiplanar, multi-echo pulse sequences of the brain and surrounding
structures were acquired without intravenous contrast. Angiographic
images of the Circle of Willis were acquired using MRA technique
without intravenous contrast.

[Series 5: DWI · axial · 3.0mm · 0.88mm/px · z∈[-59,+87]mm · 5 of 100 slices shown (1 of 4)]
[im 1/100]
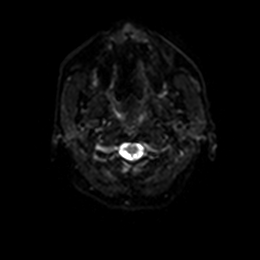
[im 25/100]
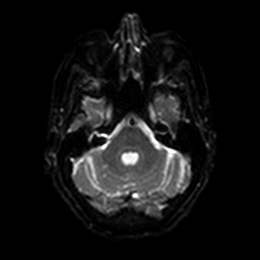
[im 50/100]
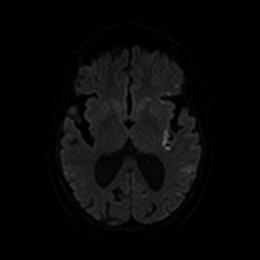
[im 75/100]
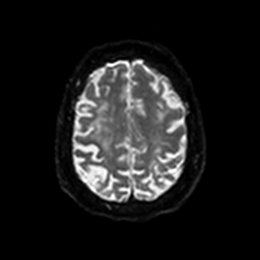
[im 100/100]
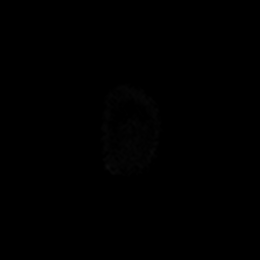

[Series 6: DWI · axial · 3.0mm · 0.88mm/px · z∈[-59,+87]mm · 2 of 50 slices shown (2 of 4)]
[im 1/50]
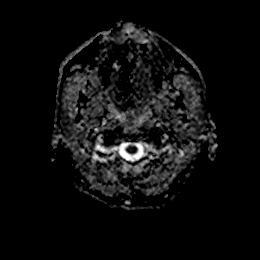
[im 50/50]
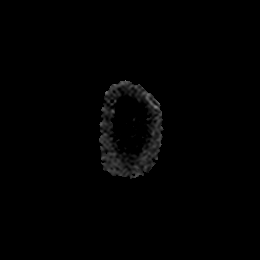

[Series 11: DWI · coronal · 4.0mm · 0.88mm/px · 4 of 64 slices shown (3 of 4)]
[im 1/64]
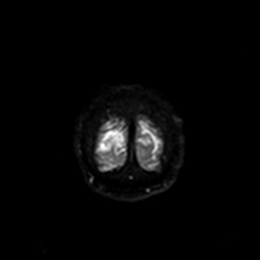
[im 22/64]
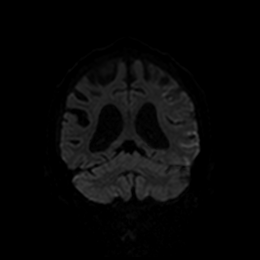
[im 43/64]
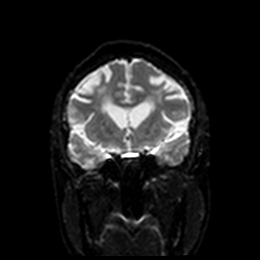
[im 64/64]
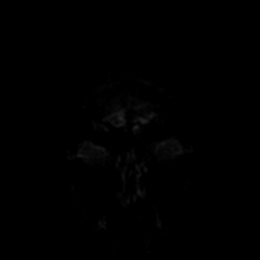

[Series 12: DWI · coronal · 4.0mm · 0.88mm/px · 2 of 32 slices shown (4 of 4)]
[im 1/32]
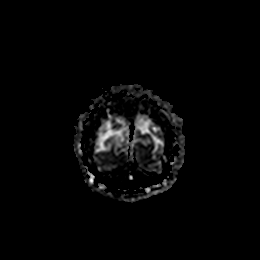
[im 32/32]
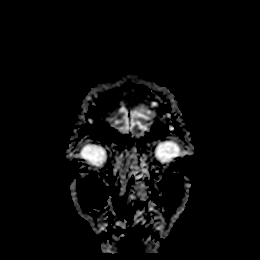

[Series 13: FLAIR · axial · 5.0mm · 0.45mm/px · z∈[-60,+89]mm · 2 of 26 slices shown]
[im 1/26]
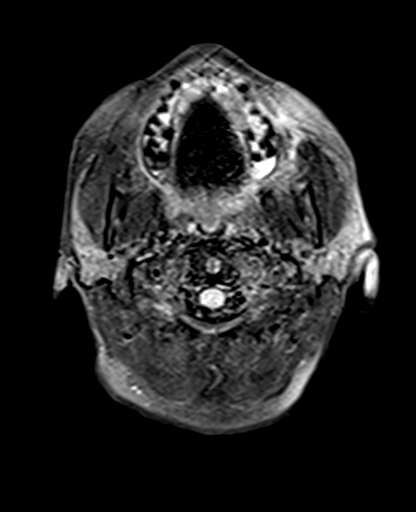
[im 26/26]
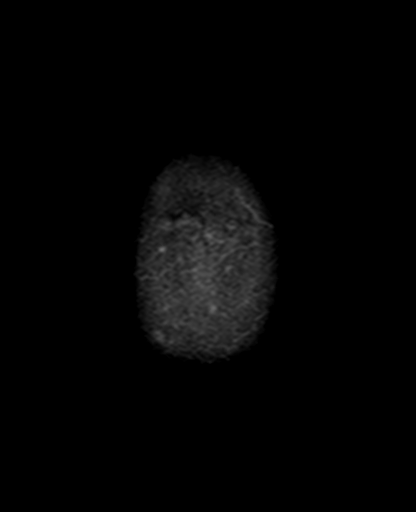

[Series 14: T2 · axial · 5.0mm · 0.72mm/px · z∈[-58,+91]mm · 2 of 26 slices shown (1 of 2)]
[im 1/26]
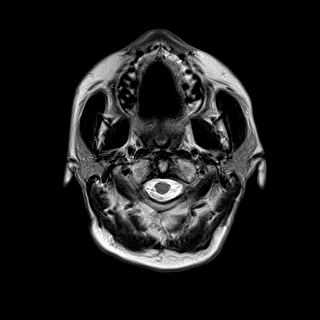
[im 26/26]
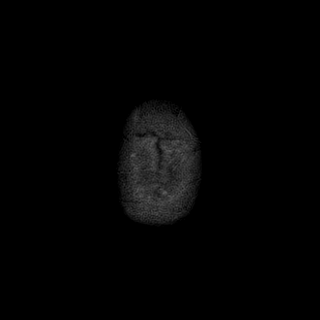

[Series 15: T1 · sagittal · 5.0mm · 0.75mm/px · 1 of 23 slices shown]
[im 1/23]
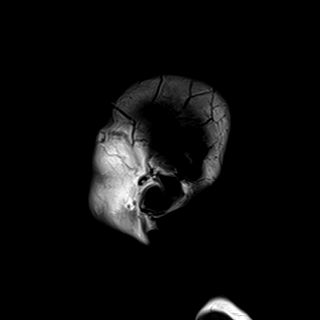

[Series 16: mag_images · axial · 3.0mm · 0.90mm/px · z∈[-60,+113]mm · 4 of 60 slices shown]
[im 1/60]
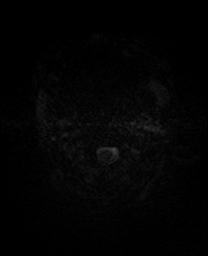
[im 20/60]
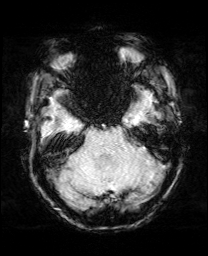
[im 40/60]
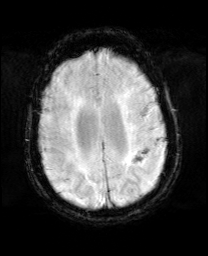
[im 60/60]
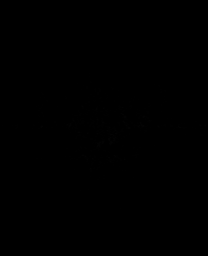

[Series 17: pha_images · axial · 3.0mm · 0.90mm/px · z∈[-60,+104]mm · 3 of 56 slices shown]
[im 1/56]
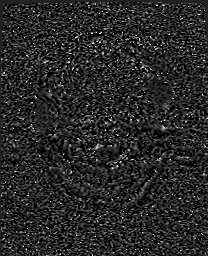
[im 28/56]
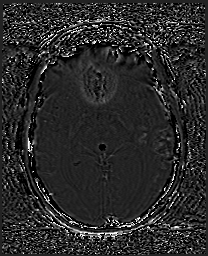
[im 56/56]
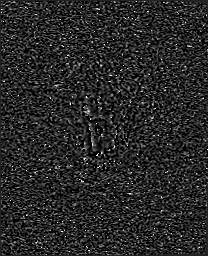

[Series 18: swi_images · axial · 3.0mm · 0.90mm/px · z∈[-60,+113]mm · 4 of 60 slices shown]
[im 1/60]
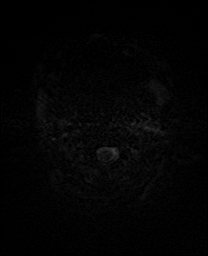
[im 20/60]
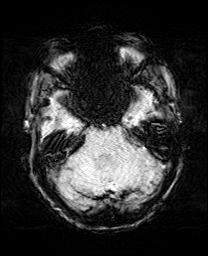
[im 40/60]
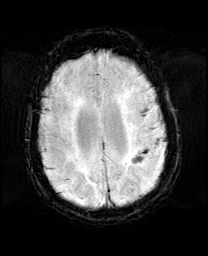
[im 60/60]
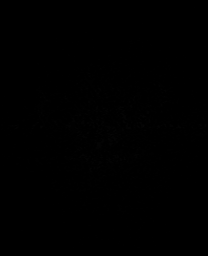

[Series 19: mip_images(sw) · axial · 24.0mm · 0.90mm/px · z∈[-50,+103]mm · 3 of 53 slices shown]
[im 1/53]
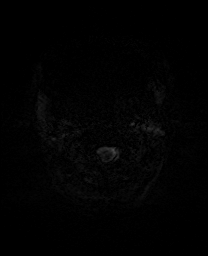
[im 27/53]
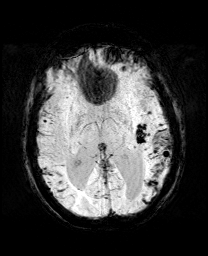
[im 53/53]
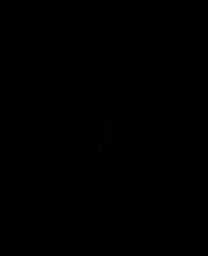

[Series 21: T2 · coronal · 5.0mm · 0.34mm/px · 2 of 29 slices shown (2 of 2)]
[im 1/29]
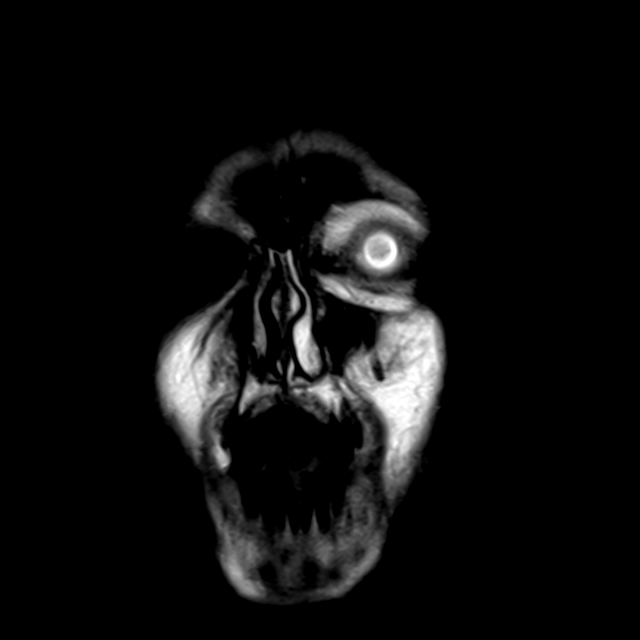
[im 29/29]
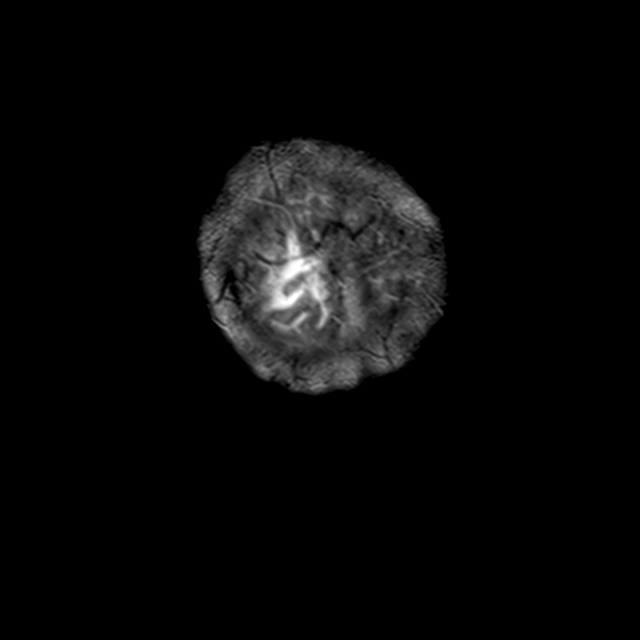

[34 of 48 positions shown; findings below may reference images not displayed]

FINDINGS: MRI HEAD FINDINGS

Brain: There are scattered small areas of diffusion restriction
throughout the left MCA distribution with involvement of the insular
cortex, anterior frontal lobe and parietal lobe cortex, and corona
radiata, consistent with evolving acute infarcts. Additional
diffusion restriction with corresponding SWI signal dropout in the
left parietal lobe superiorly is likely due to intraparenchymal
blood as seen on prior CT. Diffusion restriction in the left
temporal lobe is also likely at least in part due to blood products.

Sulcal FLAIR hyperintensity and associated SWI signal dropout
overlying the left cerebral hemisphere and sylvian fissure as well
as the right parietotemporal region is consistent with subarachnoid
blood, grossly unchanged compared to the prior CT allowing for
difference in modality.

There is a background of global parenchymal volume loss and chronic
white matter microangiopathy, unchanged. There is no solid mass
lesion. There is no midline shift.

Vascular: Normal flow voids.

Skull and upper cervical spine: Normal marrow signal.

Sinuses/Orbits: The paranasal sinuses are clear. The globes and
orbits are unremarkable.

Other: None.

MRA HEAD FINDINGS

Anterior circulation: The intracranial ICAs are patent.

The left M1 and proximal M2 segments are patent. There has been
interval recanalization of the left inferior M2 division which was
occluded on the prior CTA. Is no evidence of branch vessel occlusion
on the current study.

The right MCA is patent

The bilateral ACAs are patent.

There is no aneurysm.

Posterior circulation: The bilateral V4 segments are patent. The
basilar artery is patent.

The bilateral PCAs are patent. A left posterior communicating artery
is identified. The right posterior communicating artery is not
definitely seen.

Anatomic variants: None.
IMPRESSION: 1. Scattered small acute infarcts in the left MCA distribution as
above.
2. Subarachnoid hemorrhage overlying the bilateral cerebral
hemispheres, not significantly changed compared to the prior CT
allowing for difference in modality. The small focus of
intraparenchymal hemorrhage in the left parietal lobe is also
unchanged.
3. Interval recanalization of the previously occluded left inferior
M2 division. No high-grade stenosis or occlusion on the current
study.

## 2020-12-02 IMAGING — DX DG CHEST 1V
1 series · 1 of 1 positions shown · non-contrast
Comparison: [DATE] [DATE], [DATE], [DATE] [DATE], [DATE]

CLINICAL DATA: Stroke

EXAM:
CHEST  1 VIEW

[chest]
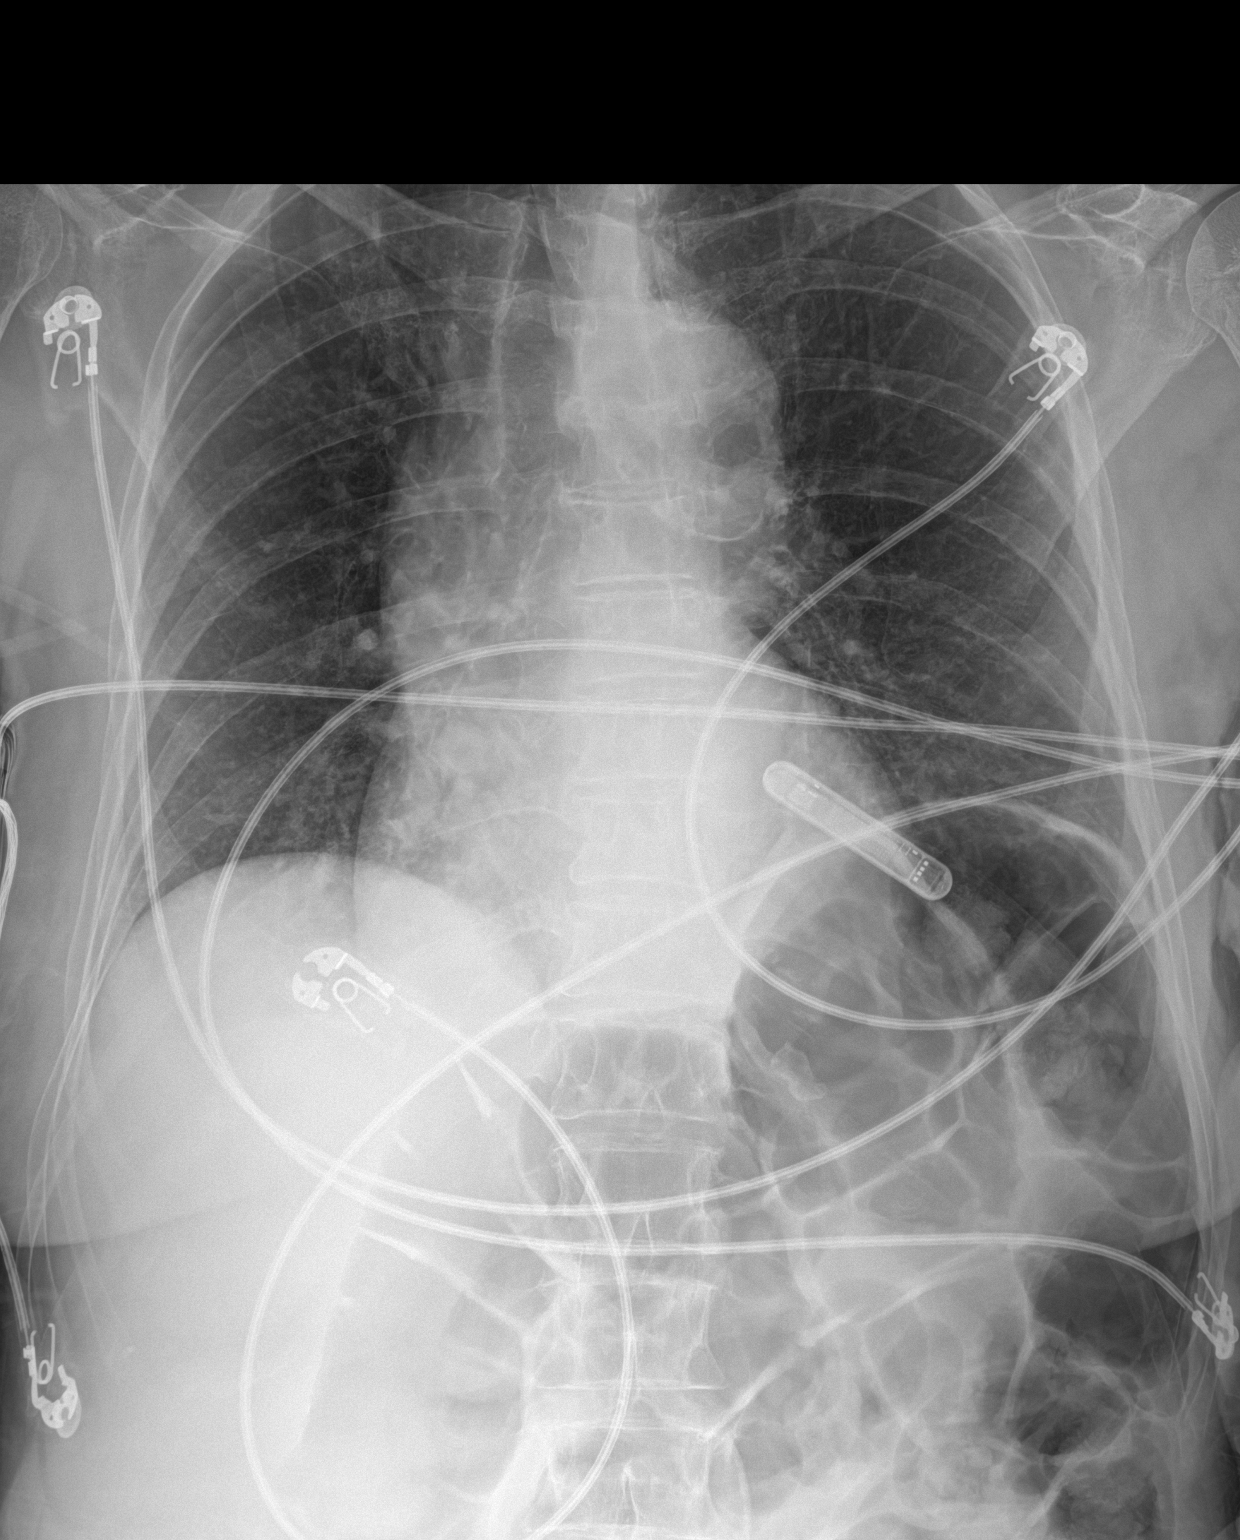

[1 of 1 positions shown; findings below may reference images not displayed]

FINDINGS: Cardiomediastinal silhouette is mildly increased in prominence along
the aortic arch, most likely due to a combination of rotation and AP
technique. No pleural effusion or pneumothorax. No acute
pleuroparenchymal abnormality. Gaseous distension of bowel beneath
the LEFT hemidiaphragm.
IMPRESSION: 1. Cardiomediastinal silhouette is mildly increased in prominence
along the aortic arch, most likely due to a combination of rotation
and AP technique. If concern for aortic pathology, recommend
dedicated PA and lateral chest radiograph.
2. Otherwise no acute cardiopulmonary abnormality.

## 2020-12-02 IMAGING — DX DG ABD PORTABLE 1V
1 series · 1 of 1 positions shown · non-contrast
Comparison: None.

CLINICAL DATA: Feeding tube placement

EXAM:
PORTABLE ABDOMEN - 1 VIEW

[abdomen kub]
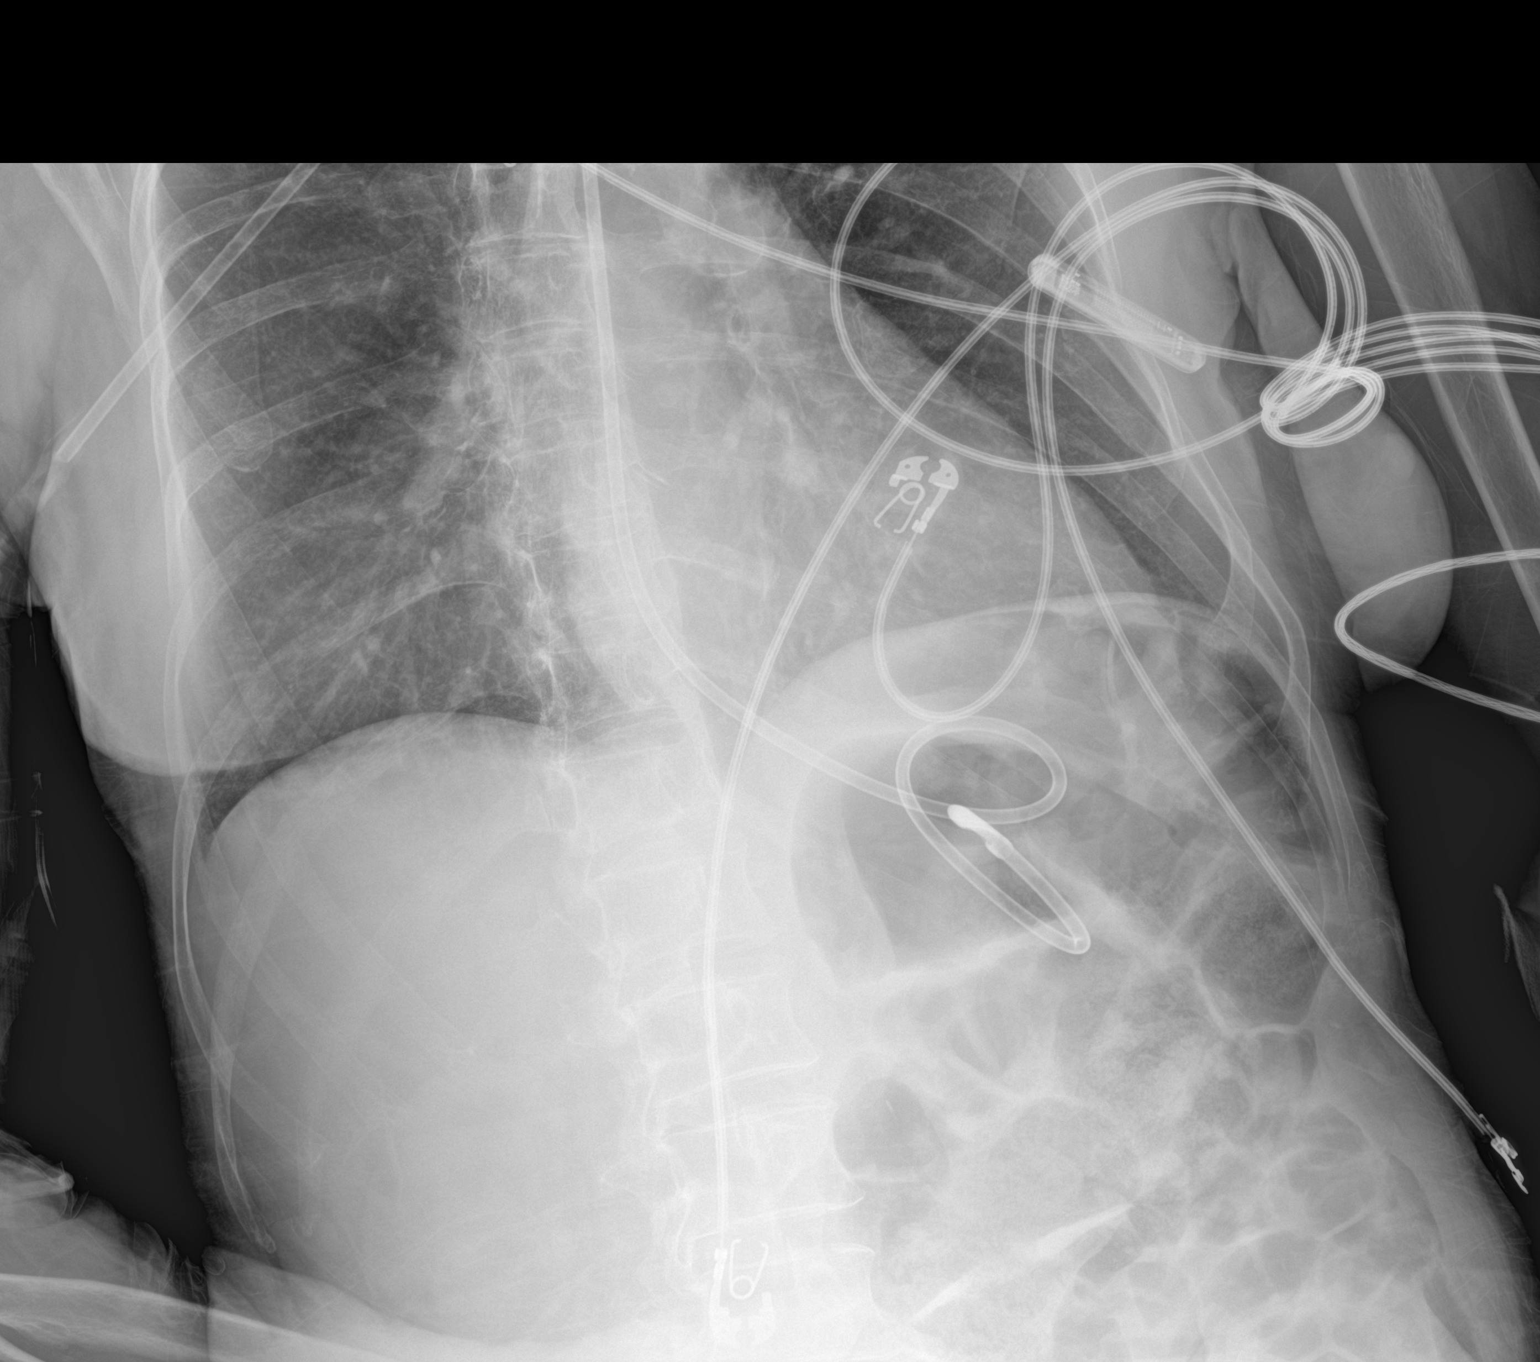

[1 of 1 positions shown; findings below may reference images not displayed]

FINDINGS: Feeding tube coiled within the proximal stomach below the left
hemidiaphragm. Nonobstructive bowel gas pattern. Mild cardiac
enlargement. Visualized lungs clear.
IMPRESSION: Feeding tube coiled within the proximal stomach.

## 2020-12-02 IMAGING — CT CT HEAD W/O CM
4 series · 16 of 47 positions shown, 18 images · non-contrast
Comparison: [DATE]

CLINICAL DATA: Follow-up after neurovascular intervention.

EXAM:
CT HEAD WITHOUT CONTRAST
TECHNIQUE: Contiguous axial images were obtained from the base of the skull
through the vertex without intravenous contrast.

[Series 3: head without · axial · non-contrast · 0.42mm/px · z∈[-102,+23]mm · 7 of 35 slices shown, 9 images]
[im 5/35  brain]
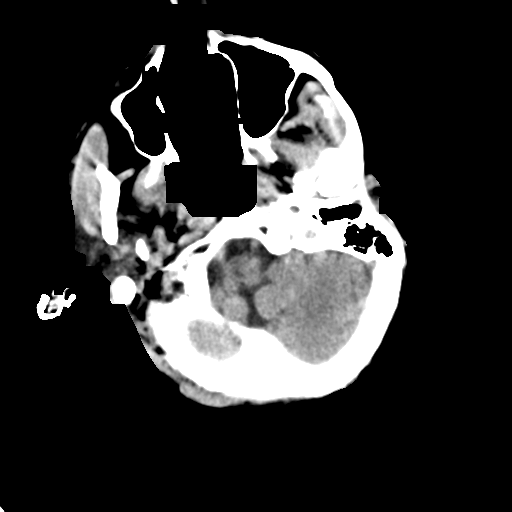
[im 5/35  bone]
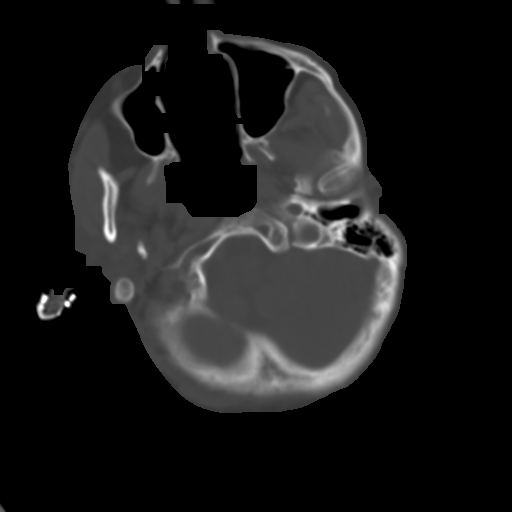
[im 9/35  brain]
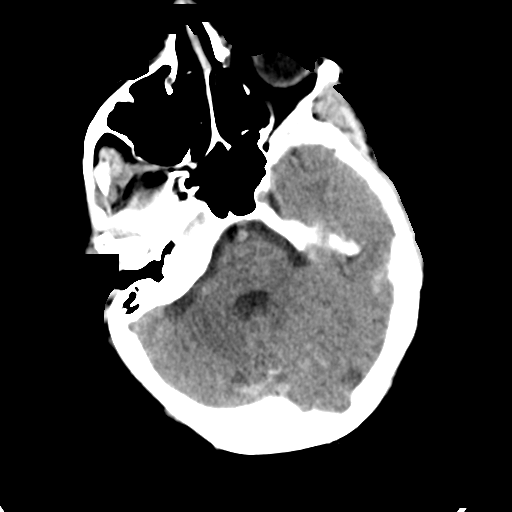
[im 13/35  brain]
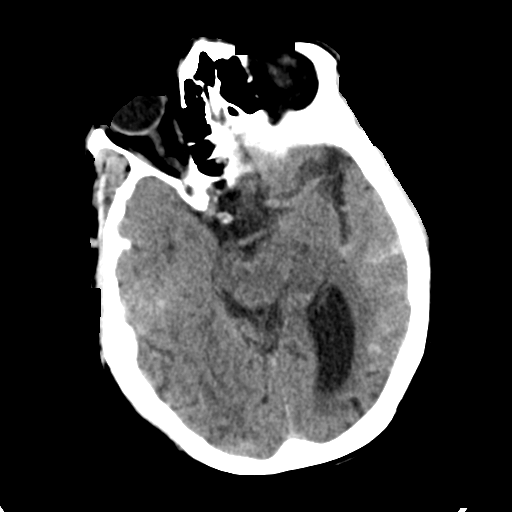
[im 18/35  brain]
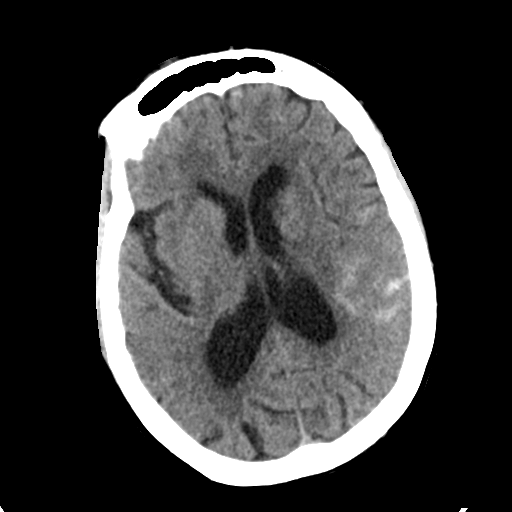
[im 22/35  brain]
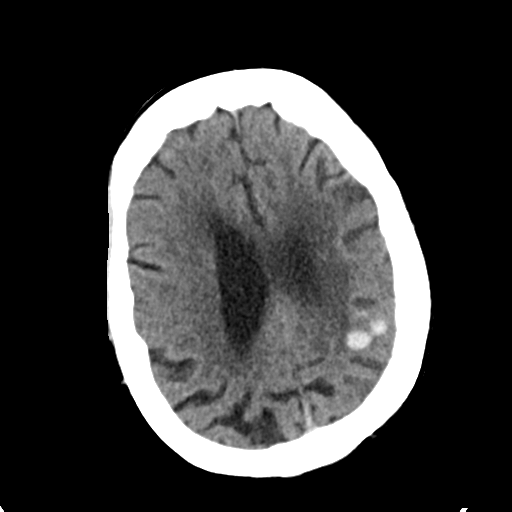
[im 22/35  bone]
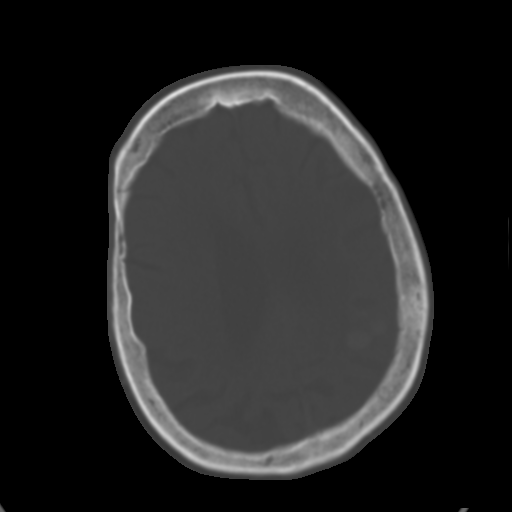
[im 26/35  brain]
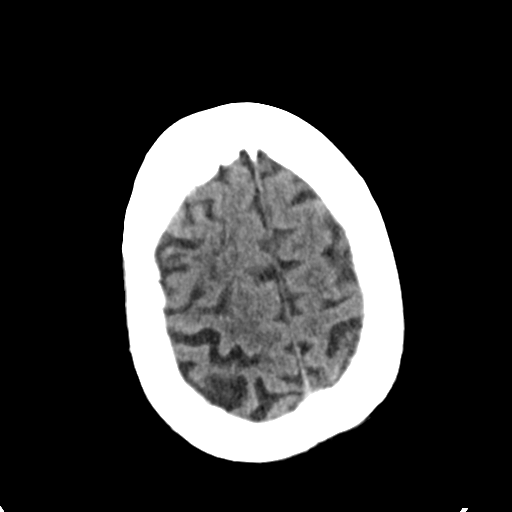
[im 30/35  brain]
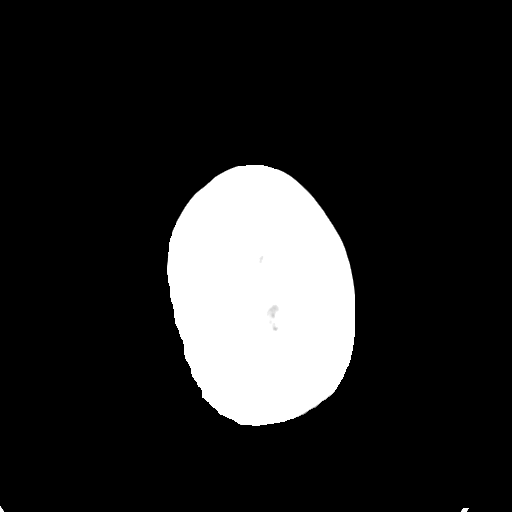

[Series 4: head bone · axial · 0.42mm/px · z∈[-106,-72]mm · 3 of 86 slices shown]
[im 9/86  bone]
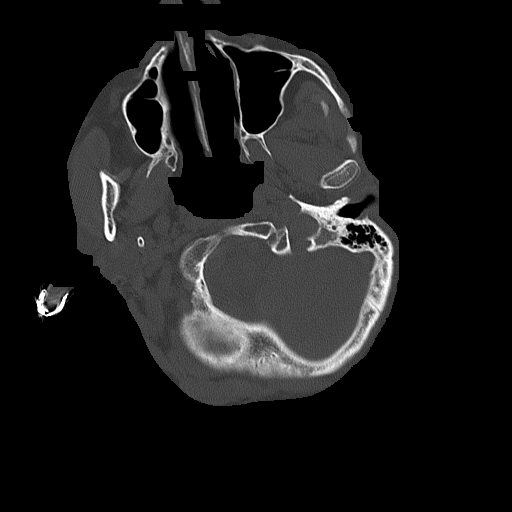
[im 18/86  bone]
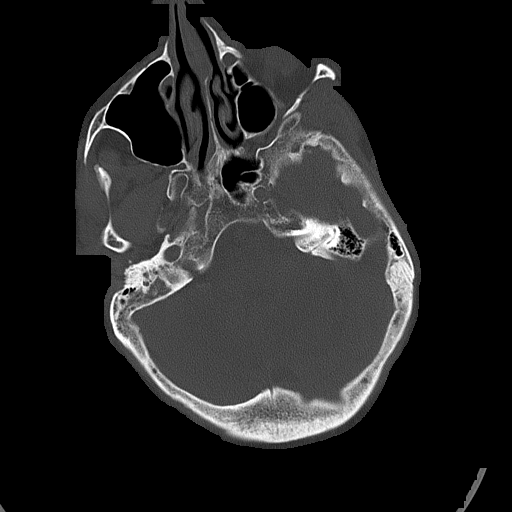
[im 26/86  bone]
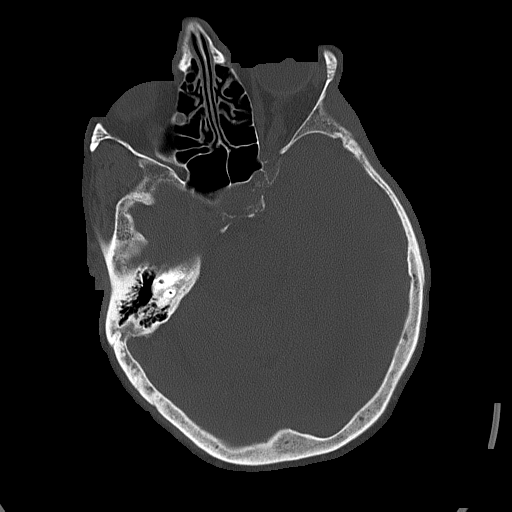

[Series 5: head without cor · coronal · non-contrast · 0.31mm/px · 3 of 75 slices shown]
[im 25/75  brain]
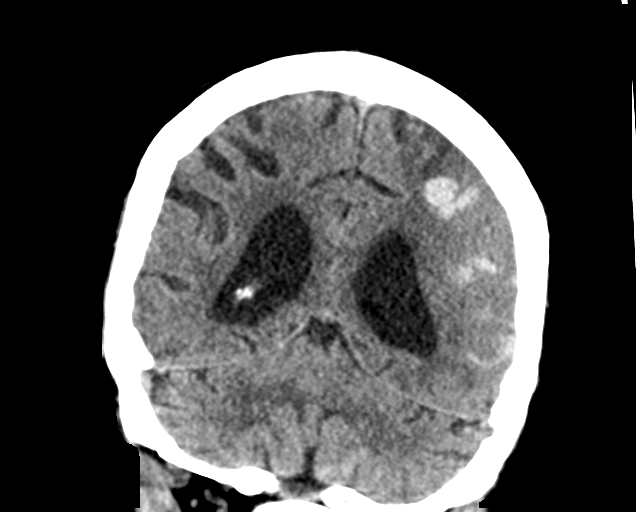
[im 33/75  brain]
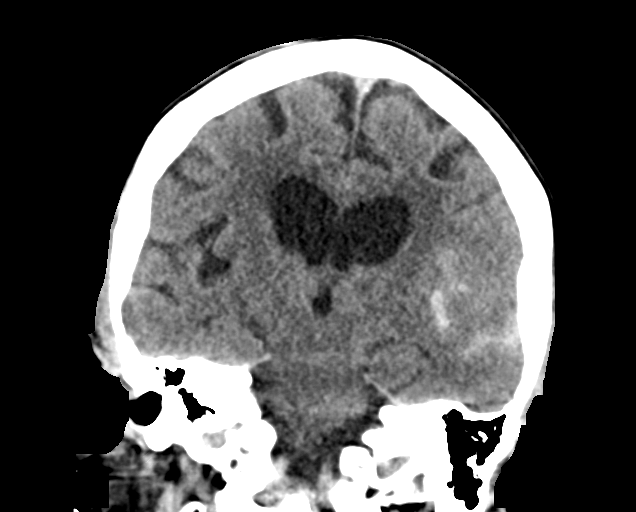
[im 42/75  brain]
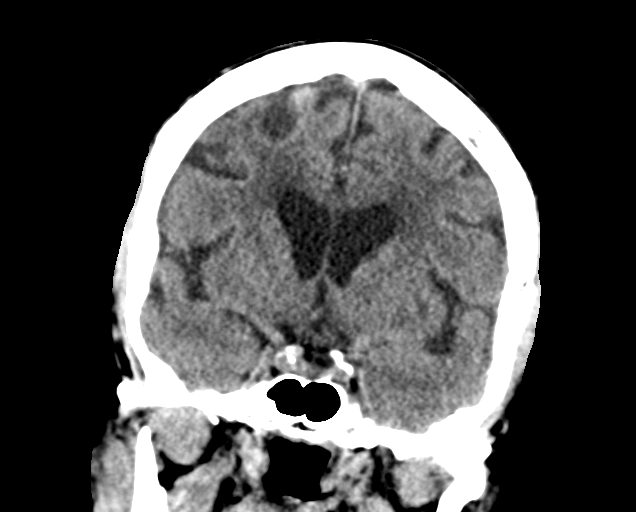

[Series 6: head without sag · sagittal · non-contrast · 0.32mm/px · 3 of 66 slices shown]
[im 22/66  brain]
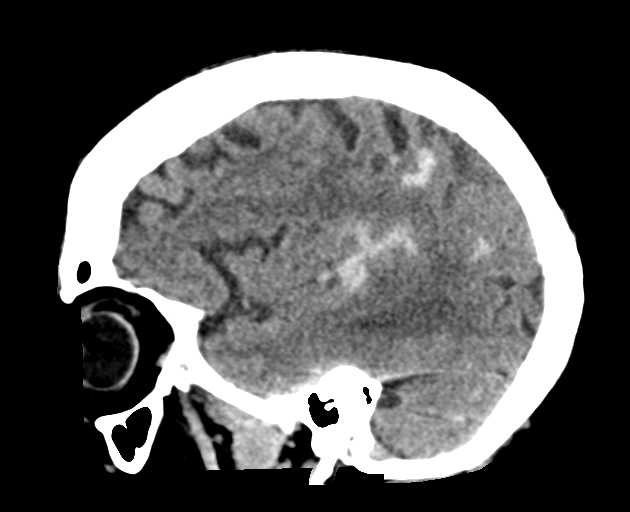
[im 33/66  brain]
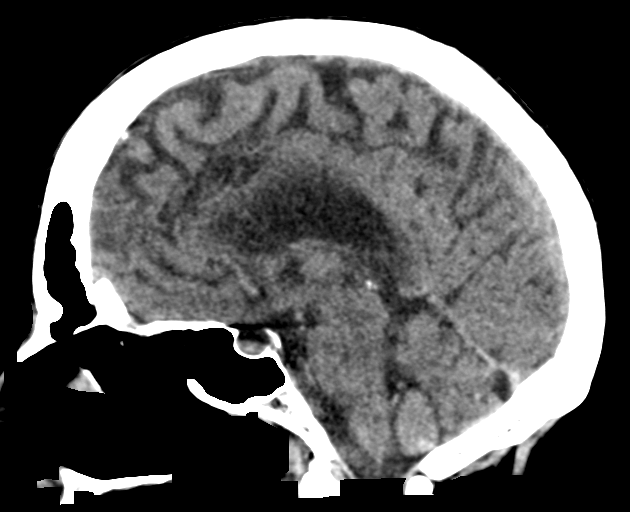
[im 44/66  brain]
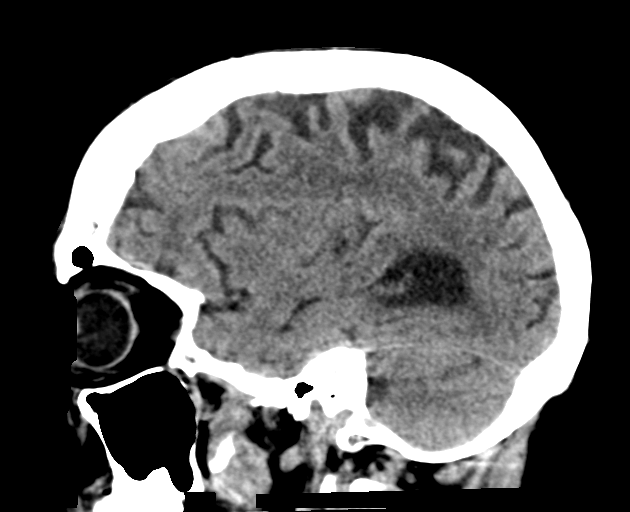

[16 of 47 positions shown; findings below may reference images not displayed]

FINDINGS: Brain: Allowing for redistribution, unchanged subarachnoid
hyperdensity over both hemispheres, left-greater-than-right. A
suspected intraparenchymal component in the left parietal lobe is
unchanged. There is periventricular hypoattenuation compatible with
chronic microvascular disease. Generalized volume loss. No
hydrocephalus.

Vascular: No abnormal hyperdensity of the major intracranial
arteries or dural venous sinuses. No intracranial atherosclerosis.

Skull: The visualized skull base, calvarium and extracranial soft
tissues are normal.

Sinuses/Orbits: No fluid levels or advanced mucosal thickening of
the visualized paranasal sinuses. No mastoid or middle ear effusion.
The orbits are normal.
IMPRESSION: 1. Unchanged subarachnoid hyperdensity over both hemispheres,
left-greater-than-right, likely a combination contrast extravasation
and subarachnoid blood.
2. Unchanged subcortical intraparenchymal blood in the left parietal
lobe.

## 2020-12-02 IMAGING — MR MR HEAD W/O CM
12 of 14 series · 34 of 48 positions shown · non-contrast
Comparison: Same-day noncontrast head CT, brain MRI [DATE]

CLINICAL DATA: Acute onset of aphasia, history of strokes

EXAM:
MRI HEAD WITHOUT CONTRAST
MRA HEAD WITHOUT CONTRAST
TECHNIQUE: Multiplanar, multi-echo pulse sequences of the brain and surrounding
structures were acquired without intravenous contrast. Angiographic
images of the Circle of Willis were acquired using MRA technique
without intravenous contrast.

[Series 5: DWI · axial · 3.0mm · 0.88mm/px · z∈[-59,+87]mm · 5 of 100 slices shown (1 of 4)]
[im 1/100]
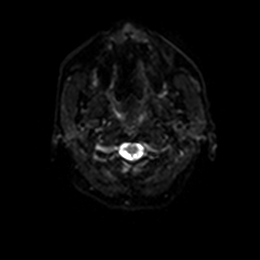
[im 25/100]
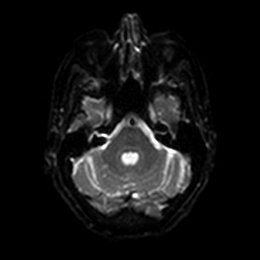
[im 50/100]
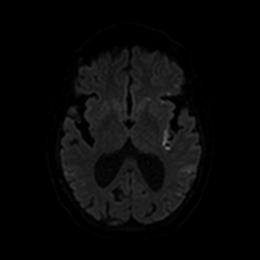
[im 75/100]
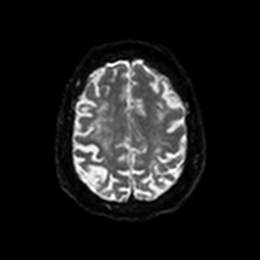
[im 100/100]
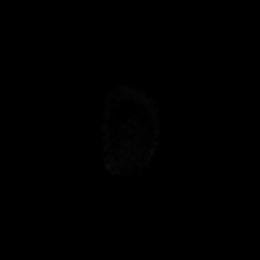

[Series 6: DWI · axial · 3.0mm · 0.88mm/px · z∈[-59,+87]mm · 2 of 50 slices shown (2 of 4)]
[im 1/50]
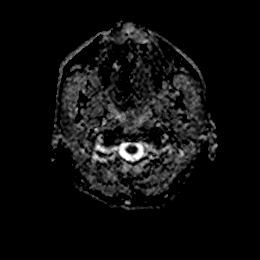
[im 50/50]
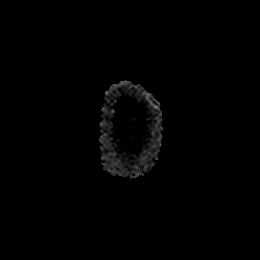

[Series 11: DWI · coronal · 4.0mm · 0.88mm/px · 4 of 64 slices shown (3 of 4)]
[im 1/64]
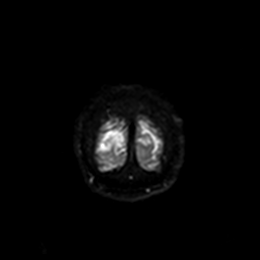
[im 22/64]
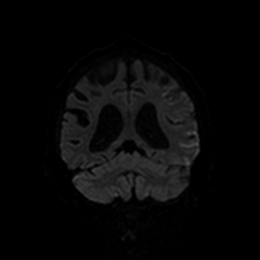
[im 43/64]
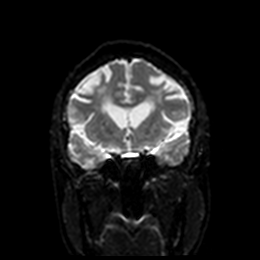
[im 64/64]
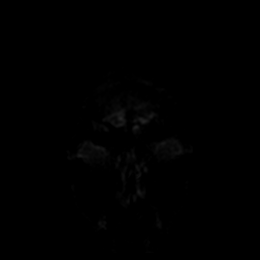

[Series 12: DWI · coronal · 4.0mm · 0.88mm/px · 2 of 32 slices shown (4 of 4)]
[im 1/32]
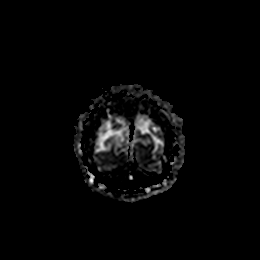
[im 32/32]
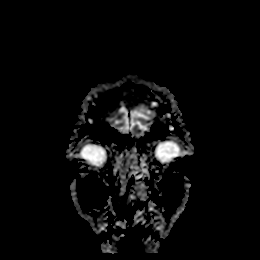

[Series 13: FLAIR · axial · 5.0mm · 0.45mm/px · z∈[-60,+89]mm · 2 of 26 slices shown]
[im 1/26]
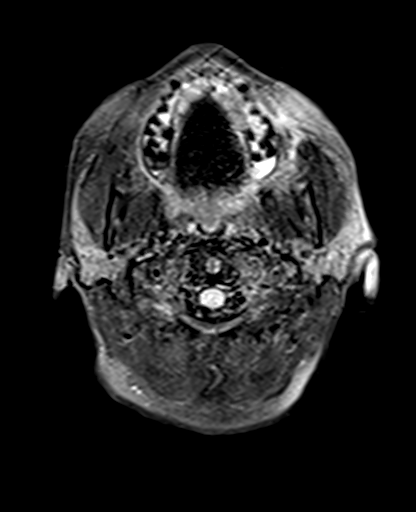
[im 26/26]
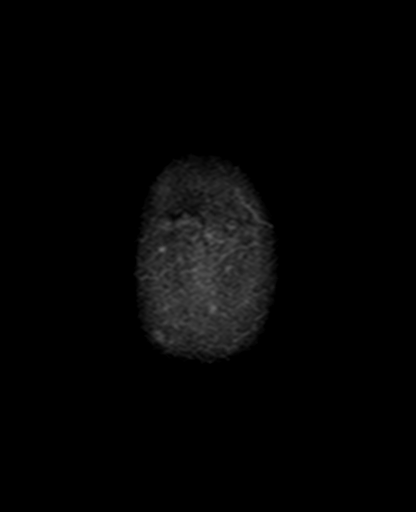

[Series 14: T2 · axial · 5.0mm · 0.72mm/px · z∈[-58,+91]mm · 2 of 26 slices shown (1 of 2)]
[im 1/26]
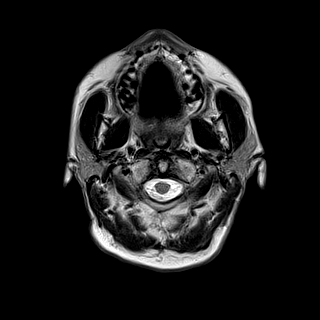
[im 26/26]
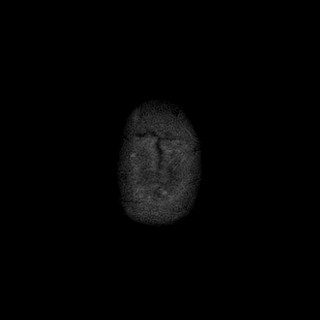

[Series 15: T1 · sagittal · 5.0mm · 0.75mm/px · 1 of 23 slices shown]
[im 1/23]
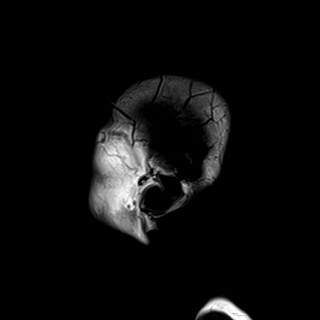

[Series 16: mag_images · axial · 3.0mm · 0.90mm/px · z∈[-60,+113]mm · 4 of 60 slices shown]
[im 1/60]
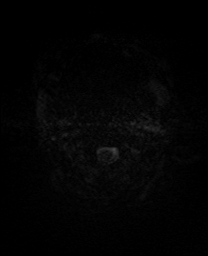
[im 20/60]
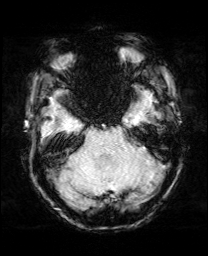
[im 40/60]
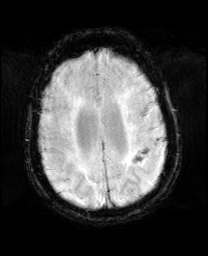
[im 60/60]
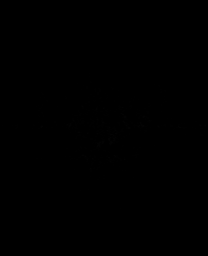

[Series 17: pha_images · axial · 3.0mm · 0.90mm/px · z∈[-60,+104]mm · 3 of 56 slices shown]
[im 1/56]
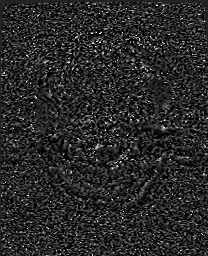
[im 28/56]
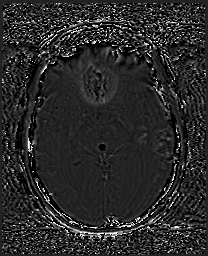
[im 56/56]
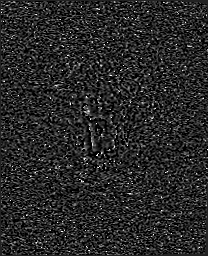

[Series 18: swi_images · axial · 3.0mm · 0.90mm/px · z∈[-60,+113]mm · 4 of 60 slices shown]
[im 1/60]
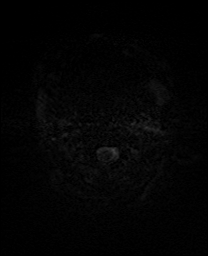
[im 20/60]
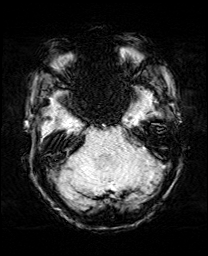
[im 40/60]
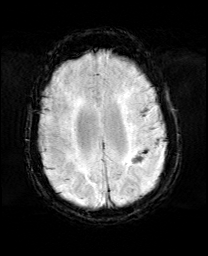
[im 60/60]
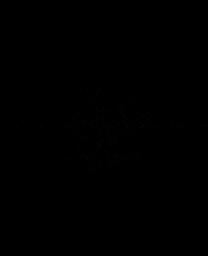

[Series 19: mip_images(sw) · axial · 24.0mm · 0.90mm/px · z∈[-50,+103]mm · 3 of 53 slices shown]
[im 1/53]
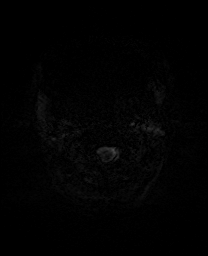
[im 27/53]
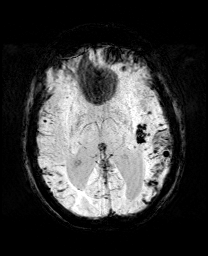
[im 53/53]
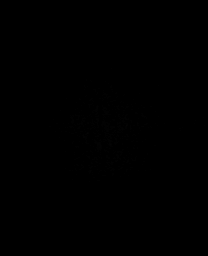

[Series 21: T2 · coronal · 5.0mm · 0.34mm/px · 2 of 29 slices shown (2 of 2)]
[im 1/29]
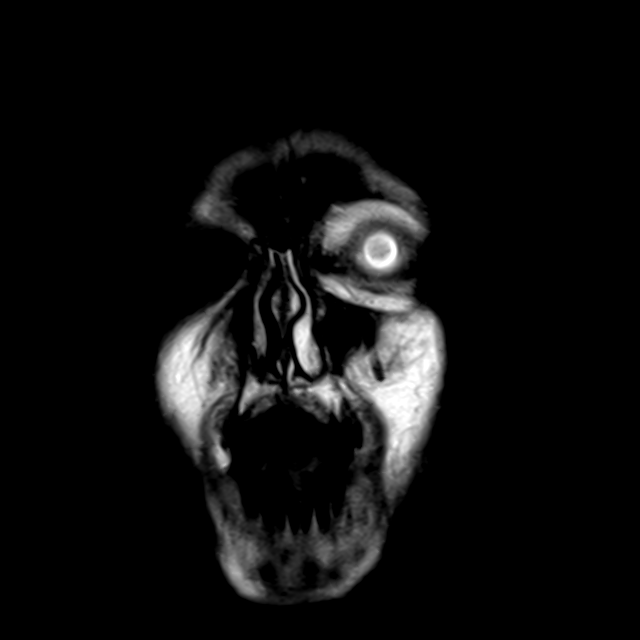
[im 29/29]
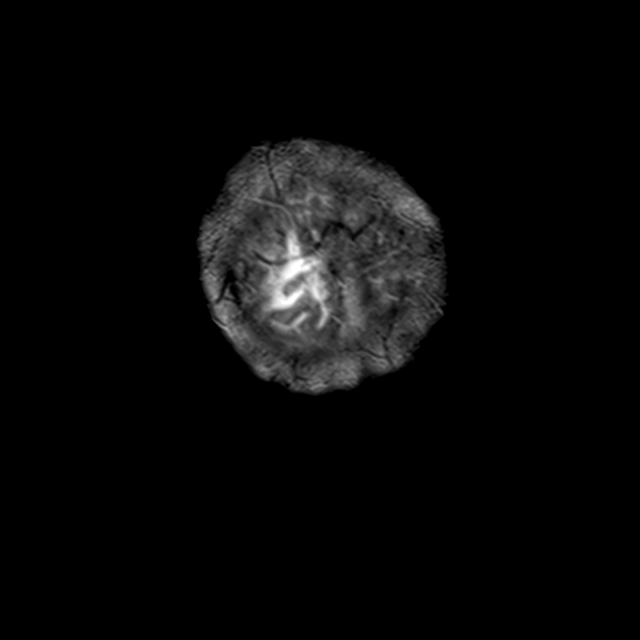

[34 of 48 positions shown; findings below may reference images not displayed]

FINDINGS: MRI HEAD FINDINGS

Brain: There are scattered small areas of diffusion restriction
throughout the left MCA distribution with involvement of the insular
cortex, anterior frontal lobe and parietal lobe cortex, and corona
radiata, consistent with evolving acute infarcts. Additional
diffusion restriction with corresponding SWI signal dropout in the
left parietal lobe superiorly is likely due to intraparenchymal
blood as seen on prior CT. Diffusion restriction in the left
temporal lobe is also likely at least in part due to blood products.

Sulcal FLAIR hyperintensity and associated SWI signal dropout
overlying the left cerebral hemisphere and sylvian fissure as well
as the right parietotemporal region is consistent with subarachnoid
blood, grossly unchanged compared to the prior CT allowing for
difference in modality.

There is a background of global parenchymal volume loss and chronic
white matter microangiopathy, unchanged. There is no solid mass
lesion. There is no midline shift.

Vascular: Normal flow voids.

Skull and upper cervical spine: Normal marrow signal.

Sinuses/Orbits: The paranasal sinuses are clear. The globes and
orbits are unremarkable.

Other: None.

MRA HEAD FINDINGS

Anterior circulation: The intracranial ICAs are patent.

The left M1 and proximal M2 segments are patent. There has been
interval recanalization of the left inferior M2 division which was
occluded on the prior CTA. Is no evidence of branch vessel occlusion
on the current study.

The right MCA is patent

The bilateral ACAs are patent.

There is no aneurysm.

Posterior circulation: The bilateral V4 segments are patent. The
basilar artery is patent.

The bilateral PCAs are patent. A left posterior communicating artery
is identified. The right posterior communicating artery is not
definitely seen.

Anatomic variants: None.
IMPRESSION: 1. Scattered small acute infarcts in the left MCA distribution as
above.
2. Subarachnoid hemorrhage overlying the bilateral cerebral
hemispheres, not significantly changed compared to the prior CT
allowing for difference in modality. The small focus of
intraparenchymal hemorrhage in the left parietal lobe is also
unchanged.
3. Interval recanalization of the previously occluded left inferior
M2 division. No high-grade stenosis or occlusion on the current
study.

## 2020-12-02 MED ORDER — VITAL HIGH PROTEIN PO LIQD: 1000.0000 mL | ORAL | Status: DC

## 2020-12-02 MED ORDER — LEVETIRACETAM IN NACL 500 MG/100ML IV SOLN
500.0000 mg | Freq: Two times a day (BID) | INTRAVENOUS | Status: DC
  Administered 2020-12-02 – 2020-12-07 (×10): 500 mg via INTRAVENOUS
  Filled 2020-12-02 (×10): qty 100

## 2020-12-02 MED ORDER — OSMOLITE 1.5 CAL PO LIQD
1000.0000 mL | ORAL | Status: DC
  Administered 2020-12-02: 1000 mL

## 2020-12-02 MED ORDER — LEVETIRACETAM IN NACL 1000 MG/100ML IV SOLN
1000.0000 mg | INTRAVENOUS | Status: AC
  Administered 2020-12-02 (×2): 1000 mg via INTRAVENOUS
  Filled 2020-12-02 (×2): qty 100

## 2020-12-02 MED ORDER — LORAZEPAM 2 MG/ML IJ SOLN: 1.0000 mg | Freq: Once | INTRAMUSCULAR | Status: AC

## 2020-12-02 MED ORDER — PANTOPRAZOLE 2 MG/ML SUSPENSION
40.0000 mg | Freq: Every day | ORAL | Status: DC
  Administered 2020-12-02 – 2020-12-03 (×2): 40 mg
  Filled 2020-12-02 (×2): qty 20

## 2020-12-02 MED ORDER — LORAZEPAM 2 MG/ML IJ SOLN
INTRAMUSCULAR | Status: AC
  Administered 2020-12-02: 1 mg via INTRAVENOUS
  Filled 2020-12-02: qty 1

## 2020-12-02 MED ORDER — SODIUM CHLORIDE 0.9 % IV SOLN: 2000.0000 mg | Freq: Once | INTRAVENOUS | Status: DC

## 2020-12-02 MED ORDER — HALOPERIDOL LACTATE 5 MG/ML IJ SOLN
5.0000 mg | Freq: Four times a day (QID) | INTRAMUSCULAR | Status: DC | PRN
  Administered 2020-12-02 – 2020-12-03 (×2): 5 mg via INTRAVENOUS
  Filled 2020-12-02 (×3): qty 1

## 2020-12-02 MED ORDER — LABETALOL HCL 5 MG/ML IV SOLN
5.0000 mg | INTRAVENOUS | Status: DC | PRN
Start: 2020-12-02 — End: 2020-12-08

## 2020-12-02 MED ORDER — LORAZEPAM 2 MG/ML IJ SOLN
1.0000 mg | Freq: Once | INTRAMUSCULAR | Status: AC
Start: 2020-12-02 — End: 2020-12-02
  Administered 2020-12-02: 1 mg via INTRAVENOUS
  Filled 2020-12-02: qty 1

## 2020-12-02 MED ORDER — LOSARTAN POTASSIUM 50 MG PO TABS
25.0000 mg | ORAL_TABLET | Freq: Every day | ORAL | Status: DC
Start: 2020-12-03 — End: 2020-12-03

## 2020-12-02 MED ORDER — THIAMINE HCL 100 MG PO TABS
100.0000 mg | ORAL_TABLET | Freq: Every day | ORAL | Status: DC
  Administered 2020-12-03: 100 mg
  Filled 2020-12-02: qty 1

## 2020-12-02 MED ORDER — CLOPIDOGREL BISULFATE 75 MG PO TABS: 75.0000 mg | ORAL_TABLET | Freq: Every day | ORAL | Status: DC

## 2020-12-02 MED ORDER — ASPIRIN 81 MG PO CHEW
81.0000 mg | CHEWABLE_TABLET | Freq: Every day | ORAL | Status: DC
Start: 2020-12-02 — End: 2020-12-04
  Administered 2020-12-02 – 2020-12-03 (×2): 81 mg
  Filled 2020-12-02 (×2): qty 1

## 2020-12-02 MED ORDER — SODIUM CHLORIDE 0.9 % IV SOLN: INTRAVENOUS | Status: AC

## 2020-12-02 MED ORDER — PROSOURCE TF PO LIQD
45.0000 mL | Freq: Two times a day (BID) | ORAL | Status: DC
  Administered 2020-12-02 – 2020-12-03 (×4): 45 mL
  Filled 2020-12-02 (×6): qty 45

## 2020-12-02 NOTE — Progress Notes (Addendum)
None STROKE TEAM PROGRESS NOTE   SUBJECTIVE (INTERVAL HISTORY) Patient is recently returned from MRI/MRA and EEG is being placed. Possible seizure activity overnight, patient received 2mg  of ativan and a loading dose of keppra. BP has been adequately controlled for 24 hours and parameters are raised to systolic 277. Discontinue cleviprex and utilize PRN medications as appropriate. Okay to discontinue arterial line as well. Plans for cortrak placement today as patient is not following commands and can not participate in a swallow evaluation.    OBJECTIVE Vitals:   12/02/20 0400 12/02/20 0500 12/02/20 0600 12/02/20 0800  BP: 108/73 124/73 105/72   Pulse: (!) 104 97 89   Resp: 16 17 19    Temp: 100.2 F (37.9 C)   99 F (37.2 C)  TempSrc: Axillary   Axillary  SpO2: 96% 92% 96%   Weight:        CBC:  Recent Labs  Lab 12/01/20 1001 12/02/20 0440  WBC 7.2 11.3*  NEUTROABS 5.1  --   HGB 12.5 11.2*  HCT 38.8 34.4*  MCV 93.9 92.2  PLT 191 824    Basic Metabolic Panel:  Recent Labs  Lab 12/01/20 1001 12/02/20 0440  NA 139 137  K 4.2 3.6  CL 104 105  CO2 27 24  GLUCOSE 132* 118*  BUN 31* 19  CREATININE 1.01* 0.87  CALCIUM 9.5 8.7*    Lipid Panel:  Recent Labs  Lab 12/02/20 0440  CHOL 153  TRIG 81  HDL 48  CHOLHDL 3.2  VLDL 16  LDLCALC 89   HgbA1c:  Lab Results  Component Value Date   HGBA1C 5.7 (H) 12/02/2020   Urine Drug Screen: No results found for: LABOPIA, COCAINSCRNUR, LABBENZ, AMPHETMU, THCU, LABBARB  Alcohol Level No results found for: [facility]  IMAGING  Results for orders placed or performed during the hospital encounter of 12/01/20  CT HEAD WO CONTRAST (5MM)   Narrative   CLINICAL DATA:  Follow-up after neurovascular intervention.  EXAM: CT HEAD WITHOUT CONTRAST  TECHNIQUE: Contiguous axial images were obtained from the base of the skull through the vertex without intravenous contrast.  COMPARISON:  12/01/2020  FINDINGS: Brain: Allowing for  redistribution, unchanged subarachnoid hyperdensity over both hemispheres, left-greater-than-right. A suspected intraparenchymal component in the left parietal lobe is unchanged. There is periventricular hypoattenuation compatible with chronic microvascular disease. Generalized volume loss. No hydrocephalus.  Vascular: No abnormal hyperdensity of the major intracranial arteries or dural venous sinuses. No intracranial atherosclerosis.  Skull: The visualized skull base, calvarium and extracranial soft tissues are normal.  Sinuses/Orbits: No fluid levels or advanced mucosal thickening of the visualized paranasal sinuses. No mastoid or middle ear effusion. The orbits are normal.  IMPRESSION: 1. Unchanged subarachnoid hyperdensity over both hemispheres, left-greater-than-right, likely a combination contrast extravasation and subarachnoid blood. 2. Unchanged subcortical intraparenchymal blood in the left parietal lobe.   Electronically Signed   By: Ulyses Jarred M.D.   On: 12/02/2020 03:36   Results for orders placed or performed during the hospital encounter of 06/24/20  MR BRAIN WO CONTRAST   Narrative   CLINICAL DATA:  Neuro deficit, acute stroke suspected  EXAM: MRI HEAD WITHOUT CONTRAST  MRA HEAD WITHOUT CONTRAST  TECHNIQUE: Multiplanar, multi-echo pulse sequences of the brain and surrounding structures were acquired without intravenous contrast. Angiographic images of the Circle of Willis were acquired using MRA technique without intravenous contrast.  COMPARISON:  And a CT code stroke. MRI head June 23, 2019. CTA June 30 2019.  FINDINGS: MRI HEAD FINDINGS  Brain: Punctate focus of restricted diffusion in the high left frontal cortex (series 3, image 38). No significant edema or mass effect. Additional punctate areas of faint DWI signal in bilateral precentral gyri (series 3, images 37 and 39). Redemonstrated multiple remote cortical infarcts in bilateral  frontal parietal and occipital lobes. Multiple bilateral cerebellar lacunar infarcts. Moderate scattered patchy confluent T2/FLAIR hyperintensities within the white matter most likely related to chronic microvascular ischemic disease. No hydrocephalus. Age advanced moderate cerebral atrophy with ex vacuo ventricular dilation. No acute hemorrhage. No mass lesion or abnormal mass effect. No extra-axial fluid collection.  Vascular: See below.  Skull and upper cervical spine: Normal marrow signal.  Sinuses/Orbits: Clear sinuses.  Unremarkable orbits.  Other: No sizable mastoid effusions.  MRA HEAD FINDINGS  Anterior circulation: Bilateral ICAs, MCAs, and ACAs are patent without flow-limiting proximal stenosis. No aneurysm identified.  Posterior circulation: The visualized intradural vertebral arteries, basilar artery and posterior cerebral arteries are patent without flow limiting proximal stenosis. No aneurysm identified.  IMPRESSION: MRI:  1. Punctate acute infarct in the high left frontal cortex. Additional faint punctate areas of DWI signal in bilateral precentral gyri are suspicious for additional acute or subacute infarcts, but could potentially represent artifact. Given potential involvement of multiple vascular territories, consider a central embolic etiology. 2. 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 3. Moderate atrophy.  MRA:  No large vessel occlusion or proximal flow limiting stenosis.   Electronically Signed   By: Margaretha Sheffield MD   On: 06/24/2020 20:15   MR ANGIO HEAD WO CONTRAST   Narrative   CLINICAL DATA:  Neuro deficit, acute stroke suspected  EXAM: MRI HEAD WITHOUT CONTRAST  MRA HEAD WITHOUT CONTRAST  TECHNIQUE: Multiplanar, multi-echo pulse sequences of the brain and surrounding structures were acquired without intravenous contrast.  Angiographic images of the Circle of Willis were acquired using MRA technique without intravenous contrast.  COMPARISON:  And a CT code stroke. MRI head June 23, 2019. CTA June 30 2019.  FINDINGS: MRI HEAD FINDINGS  Brain: Punctate focus of restricted diffusion in the high left frontal cortex (series 3, image 38). No significant edema or mass effect. Additional punctate areas of faint DWI signal in bilateral precentral gyri (series 3, images 37 and 39). Redemonstrated multiple remote cortical infarcts in bilateral frontal parietal and occipital lobes. Multiple bilateral cerebellar lacunar infarcts. Moderate scattered patchy confluent T2/FLAIR hyperintensities within the white matter most likely related to chronic microvascular ischemic disease. No hydrocephalus. Age advanced moderate cerebral atrophy with ex vacuo ventricular dilation. No acute hemorrhage. No mass lesion or abnormal mass effect. No extra-axial fluid collection.  Vascular: See below.  Skull and upper cervical spine: Normal marrow signal.  Sinuses/Orbits: Clear sinuses.  Unremarkable orbits.  Other: No sizable mastoid effusions.  MRA HEAD FINDINGS  Anterior circulation: Bilateral ICAs, MCAs, and ACAs are patent without flow-limiting proximal stenosis. No aneurysm identified.  Posterior circulation: The visualized intradural vertebral arteries, basilar artery and posterior cerebral arteries are patent without flow limiting proximal stenosis. No aneurysm identified.  IMPRESSION: MRI:  1. Punctate acute infarct in the high left frontal cortex. Additional faint punctate areas of DWI signal in bilateral precentral gyri are suspicious for additional acute or subacute infarcts, but could potentially represent artifact. Given potential involvement of multiple vascular territories, consider a central embolic etiology. 2. Multiple remote cortical infarcts in bilateral frontal, parietal, and occipital lobes and  multiple bilateral cerebellar lacunar infarcts. 3. Moderate  atrophy.  MRA:  No large vessel occlusion or proximal flow limiting stenosis.   Electronically Signed   By: Margaretha Sheffield MD   On: 06/24/2020 20:15   Results for orders placed or performed during the hospital encounter of 06/23/19  MR BRAIN W WO CONTRAST   Narrative   [facility]  NEUROIMAGING REPORT   STUDY DATE: 06/23/19 PATIENT NAME: MEILIN BROSH DOB: 1955-01-06 MRN: [id number]  ORDERING CLINICIAN: [name], MD  CLINICAL HISTORY: 65 year old female with numbness.  EXAM: MR BRAIN W WO CONTRAST  TECHNIQUE: MRI of the brain with and without contrast was obtained  utilizing 5 mm axial slices with T1, T2, T2 flair, SWI and diffusion  weighted views.  T1 sagittal, T2 coronal and postcontrast views in the  axial and coronal plane were obtained. CONTRAST: 40ml multihance  COMPARISON: none  IMAGING SITE: [facility] [address] (1.5 Tesla MRI)    FINDINGS:   Scattered bilateral periventricular, subcortical and juxtacortical DWI  hyperintensities in the bilateral cerebral hemispheres.  Some of these are  hypointense on ADC maps.  Moderate periventricular and subcortical and  cortical foci of T2FLAIR hyperintensities, consistent with chronic  ischemic disease.   No abnormal lesions are seen on diffusion-weighted views to suggest acute  ischemia. The cortical sulci, fissures and cisterns are normal in size and  appearance. Lateral, third and fourth ventricle are normal in size and  appearance. No extra-axial fluid collections are seen. No evidence of mass  effect or midline shift.  No abnormal lesions are seen on post contrast  views.    On sagittal views the posterior fossa, pituitary gland and corpus callosum  are unremarkable. No evidence of intracranial hemorrhage on SWI views. The  orbits and their contents, paranasal sinuses and calvarium are   unremarkable.  Intracranial flow voids are present.     Impression   MRI brain (with and without) demonstrating: -Scattered acute, subacute and chronic ischemic infarcts in the bilateral  cerebral hemispheres. Pattern suggests a proximal / cardioembolic source.  -Moderate chronic small vessel ischemic disease.      INTERPRETING PHYSICIAN:  Penni Bombard, MD Certified in Neurology, Neurophysiology and Neuroimaging  [facility] [address] [place], [place] [address] [phone]        PHYSICAL EXAM  Temp:  [97.9 F (36.6 C)-100.2 F (37.9 C)] 99 F (37.2 C) (12/02 0800) Pulse Rate:  [84-123] 89 (12/02 0600) Resp:  [7-20] 19 (12/02 0600) BP: (102-159)/(7-100) 105/72 (12/02 0600) SpO2:  [92 %-100 %] 96 % (12/02 0600) Arterial Line BP: (114-151)/(55-83) 129/56 (12/02 0600)  General - Well nourished, well developed, in no apparent distress.  Ophthalmologic - fundi not visualized due to noncooperation.  Cardiovascular - Regular rhythm and rate.  Mental Status -  Opens eyes to noxious stimuli and then falls back asleep;  does not follow commands. Expressive aphasia, difficult to arouse  Cranial Nerves II - XII - II - PERRL. Blinks to threat consistently on the left, inconsistent blink to threat to her right.  III, IV, VI - Left gaze preference. V - Facial sensation intact bilaterally. VII - Right nasolabial fold flattening. VIII - Hearing & vestibular intact bilaterally. X - Palate elevates symmetrically. XI - Head is midline XII - Tongue protrusion intact.  Motor Strength - Bulk was normal and fasciculations were absent.  Spontaneous movement in all extremities.  Motor Tone - Increased muscle tone in the upper right extremity  Sensory -  not cooperative but could  Coordination - not cooperative. Tremor was absent.  Gait and Station - deferred.    ASSESSMENT/PLAN [name] is a 65 y.o. female with history  of anemia, anxiety, depression, two prior strokes, HTN, loop recorder implant in 10/2019, osteoporosis, and thrombocytopenia presenting with acute onset of aphasia and worsened right sided incoordination relative to her baseline deficit. On arrival to the ED the patient was with dense receptive aphasia and mild extensor posturing of her RUE with mild RUE and RLE weakness. .   Stroke:  left distal M2/M3 occlusion s/p TNK and IR, complicated by small SAH with TNK reversal with TXA, etiology unclear CT head - Unchanged subarachnoid hyperdensity over both hemispheres, left-greater-than-right, likely a combination contrast extravasation and subarachnoid blood.  Unchanged subcortical intraparenchymal blood in the left parietal lobe. MRI head Scattered small acute infarcts in the left MCA distribution as above. Subarachnoid hemorrhage overlying the bilateral cerebral hemispheres, not significantly changed compared to the prior CT allowing for difference in modality. The small focus of intraparenchymal hemorrhage in the left parietal lobe is also unchanged.  MRA head  Interval recanalization of the previously occluded left inferior M2 division. No high-grade stenosis or occlusion on the current study. IR - Successful mechanical thrombectomy, small left sylvian subarachnoid hemorrhage vs contrast extravasation on postprocedural flat panel head ct 2D Echo  Pending EEG-  evidence of cortical dysfunction and an epileptogenicity arising from left frontotemporal region, likely secondary to underlying structural abnormality.  No seizures  were seen throughout the recording. October 2021 Loop Recorder- negative for Afib Venous Duplex- Pending LDL 89 HgbA1c 5.7 SCDs for VTE prophylaxis aspirin 81 mg daily prior to admission, now on aspirin 325 mg daily. Hold plavix due to [facility] Patient counseled to be compliant with her antithrombotic medications Ongoing aggressive stroke risk factor management Therapy  recommendations:  Pending Disposition:  Pending  Previous Stroke- June 2022 Cryptogenic stroke with loop recorder placement MRI/MRA brain done revealing punctate infarct and high "report except faint areas of DWI signal in bilateral precentral gyri question acute/subacute infarct or artifact.  Stroke felt to be embolic recorder was interrogated with negative for A. fib  Possible Seizure activity Left facial twitching with altered mental status Ativan overnight EEG done 12/02/2020 showed left spikes Loaded with keppra and continue keppra BID  Hypertension Stable  BP goal < 160 given SAH post procedure  Long-term BP goal normotensive Home medications losartan  Hyperlipidemia Home meds:  None LDL 89, goal < 70 Start Lipitor 40mg  Continue statin at discharge  Diabetes type II HgbA1c 5.7, goal < 7.0 Controlled  Dysphagia N.p.o. Did not pass swallow Will place core track and start tube feeding and p.o. medication Speech on board  Other Stroke Risk Factors Advanced age  Other Active Problems Vascular Dementia Altered Mental Status Cortrak to be placed Tube Feed starting today Decrease IVF once tube feeding starts  Hospital day # 1   Pt seen by NP/Neuro and later by MD. Note/plan to be edited by MD as needed.    Janine Ores, FNP-BC [facility]  ATTENDING NOTE: I reviewed above note and agree with the assessment and plan. Pt was seen and examined.   65 year old female with history of hypertension, dementia, history of stroke admitted for aphasia, right-sided weakness and right upper extremity extension posturing.  CT no acute abnormality.  CT head and neck distal left M2 occlusion.  CT perfusion 0/16.  Status post TNKase and IR showed left M3 occlusion status  post thrombectomy with TICI2c recanalization.  CT showed left hemisphere SAH status post TXA.  Fibrinogen level 293, CT repeat stable SAH.  Overnight, patient had left facial twitching and altered  mental status, repeat CT stable SAH, given Ativan and Keppra load.  MRI showed small scattered left MCA infarcts with small amount of SAH.  MRA showed recanalization of left M2.  EEG left spikes but no seizure.  2D echo pending.  Loop recorder so far negative for A. fib.  A1c 5.7, LDL 89.  Creatinine 1.01.  In 06/2019 patient admitted for bilateral punctate infarcts as well as chronic bilateral infarcts and right cerebellum infarct.  CTA head and neck left M2 moderate stenosis.  30-day cardiac event monitoring negative.  Loop recorder placed.  In 06/2020 patient admitted again for left-sided weakness with fall.  Declined tPA at the time.  CT negative.  MRI showed right ACA 2 punctate infarcts, left frontal punctate infarct.  MRA unremarkable.  Carotid Doppler unremarkable.  EF 55 to 60%.  No DVT.  Loop recorder interrogation negative for A. fib.  LDL 108, A1c 5.8.  Discharged on DAPT and Lipitor 40.  On exam, patient EEG tech at bedside to start EEG.  Patient eyes closed, with forced eye opening, eyes midline, spontaneous eye rolling bilaterally.  Not blinking to visual threat bilaterally.  Pupil pinpoint bilaterally.  Nonverbal, not following commands.  Slight right nasolabial fold flattening.  Right upper extremity drift to bed within 5 seconds.  Left upper extremity against gravity able to hold with minimal drift.  Bilateral lower extremity withdraw to pain symmetrically and able to keep knee flexion and foot on bed position without drift. Sensation, coordination and gait not tested.  Etiology for patient stroke unclear, still consistent with embolic source.  However loop recorder so far negative for A. fib.  Once stable, may consider further cardioembolic work-up with TEE, may consider empiric anticoagulation treatment.  Currently patient has small SAH from procedure, will start aspirin 81 at this time.  Continue home Lipitor 40.  Patient failed swallow, will put on core track and start tube feeding n.p.o.  meds.  Given seizure activity overnight, and EEG left spikes, will continue Keppra 500 twice daily.  BP goal less than 160 given SAH postprocedure.  PT/OT pending.  For detailed assessment and plan, please refer to above as I have made changes wherever appropriate.   Rosalin Hawking, MD PhD Stroke Neurology 12/02/2020 7:04 PM  This patient is critically ill due to left MCA stroke, status post TN K and thrombectomy, SAH post procedure, seizure and at significant risk of neurological worsening, death form recurrent stroke, hemorrhagic conversion, status epilepticus, heart failure, vasospasm, respiratory failure. This patient's care requires constant monitoring of vital signs, hemodynamics, respiratory and cardiac monitoring, review of multiple databases, neurological assessment, discussion with family, other specialists and medical decision making of high complexity. I spent 45 minutes of neurocritical care time in the care of this patient.     To contact Stroke Continuity provider, please refer to [email or web address] After hours, contact General Neurology

## 2020-12-02 NOTE — Progress Notes (Signed)
Initial Nutrition Assessment  DOCUMENTATION CODES:   Non-severe (moderate) malnutrition in context of chronic illness  INTERVENTION:   Initiate tube feeding via Cortrak tube: Osmolite 1.5 at 45 ml/h (1080 ml per day) Prosource TF 45 ml BID  Provides 1700 kcal, 89 gm protein, 820 ml free water daily   NUTRITION DIAGNOSIS:   Moderate Malnutrition related to chronic illness (CVA) as evidenced by mild fat depletion, moderate muscle depletion.  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    Pt discussed during ICU rounds and with RN.   12/1 s/p CT angio and mechanical thrombectomy; repeat CT shows SAH post IR procedure  12/2 cortrak placed; tip gastric    Spoke with pt's sister who reports that pt's weight has been stable for the last 6 months. She loves to walk and since her recent stroke she has been exercising and eating healthier.  Spoke with husband who reports that after pt's stroke she lost down to 105 lb but has been able to regain up to ~118 lb.  He reports that patient eats 3 meals per day and 1-2 snacks.   Medications reviewed and include: Vitamin D3, SSI, solumedrol, protonix, thiamine  Labs reviewed   NUTRITION - FOCUSED PHYSICAL EXAM:  Flowsheet Row Most Recent Value  Orbital Region Mild depletion  Upper Arm Region Mild depletion  Thoracic and Lumbar Region Moderate depletion  Buccal Region Mild depletion  Temple Region No depletion  Clavicle Bone Region Moderate depletion  Clavicle and Acromion Bone Region Moderate depletion  Scapular Bone Region Unable to assess  Dorsal Hand Mild depletion  Patellar Region No depletion  Anterior Thigh  Region No depletion  Posterior Calf Region No depletion  Edema (RD Assessment) None  Hair Reviewed  Eyes Unable to assess  Mouth Unable to assess  Skin Reviewed  Nails Reviewed       Diet Order:   Diet Order             Diet NPO time specified  Diet effective now                   EDUCATION NEEDS:   Not appropriate for education at this time  Skin:  Skin Assessment: Reviewed RN Assessment  Last BM:  unknown  Height:   Ht Readings from Last 1 Encounters:  11/17/20 5\' 4"  (1.626 m)    Weight:   Wt Readings from Last 1 Encounters:  12/01/20 56.8 kg    BMI:  Body mass index is 21.49 kg/m.  Estimated Nutritional Needs:   Kcal:  1600-1800  Protein:  75-90 grams  Fluid:  > 1.6 L/day  Lockie Pares., RD, LDN, CNSC See AMiON for contact information

## 2020-12-02 NOTE — Progress Notes (Signed)
On-call cross coverage note.  Multiple issues overnight  #1 Heart rate in the 120s that spontaneously came down.  Had required labetalol in the PACU but did not require any IV labetalol in the ICU.  Will give as needed labetalol if sustained heart rates in the 120s or higher.  #2 Then I was reapproached by the nurse regarding change in her neurological exam around 2:25 AM.  According to RN, patient was able to at least tell her name before but now seems to be not responding.  Sent for stat CT head.  Reviewed personally.  Clearing of the contrast extravasation and unchanged subarachnoid hyperdensity over both hemispheres left greater than right.  Unchanged subcortical intracranial blood in the left parietal lobe.  As I examined her in the CT scanner, she had right facial twitching concerning for seizures.  On my examination, her eyes were open, she was awake, not following any commands, leftward gaze preference, does not blink to threat from the right.  As I was examining, she started having some more verbalization after the Ativan was given.  Remains hemiparetic on the right   Given Ativan 1 mg x 2. Loaded with Keppra 2000 mg IV x1. Will continue evaluation. If clinically appears to be seizing, will use fosphenytoin as next load. I will also put orders for Keppra 500 twice daily ongoing. EEG in the morning   -- Amie Portland, MD Neurologist [facility] Pager: [phone]  Additional 33 min of cc time.

## 2020-12-02 NOTE — Progress Notes (Signed)
EEG complete - results pending 

## 2020-12-02 NOTE — TOC CAGE-AID Note (Signed)
Transition of Care [facility]) - CAGE-AID Screening   Patient Details  Name: Jacqueline Bonilla MRN: [id number] Date of Birth: October 12, 1955  [facility] CM/SW Contact:    Saladin Petrelli C Tarpley-Carter, [place] Phone Number: 12/02/2020, 11:41 AM   Clinical Narrative: Pt is unable to participate in Cage Aid.  Humphrey Guerreiro Tarpley-Carter, MSW, LCSW-A Pronouns:  She/Her/Hers [facility] Clinical Social Worker Direct Number:  [phone] [email or web address]  CAGE-AID Screening: Substance Abuse Screening unable to be completed due to: : Patient unable to participate             Substance Abuse Education Offered: No

## 2020-12-02 NOTE — Procedures (Signed)
Patient Name: Jacqueline Bonilla  MRN: [id number]  Epilepsy Attending: Lora Havens  Referring Physician/Provider: Dr. Rosalin Hawking Date: 12/02/2020 Duration: 41.54 mins  Patient history: 65 year old female with facial twitching and left gaze preference. EEG evaluate for seizure.  Level of alertness: Awake, asleep  AEDs during EEG study: LEV, ativan  Technical aspects: This EEG study was done with scalp electrodes positioned according to the 10-20 International system of electrode placement. Electrical activity was acquired at a sampling rate of 500Hz  and reviewed with a high frequency filter of 70Hz  and a low frequency filter of 1Hz . EEG data were recorded continuously and digitally stored.   Description: The posterior dominant rhythm consists of 9-10 Hz activity of moderate voltage (25-35 uV) seen predominantly in posterior head regions, asymmetric ( L<R) and reactive to eye opening and eye closing. Sleep was characterized by vertex waves, sleep spindles (12 to 14 Hz), maximal frontocentral region.  EEG showed continuous 3 to 6 Hz theta-delta slowing in left frontotemporal region.  Frequent spikes were also noted in left frontotemporal region.  Hyperventilation and photic stimulation were not performed.     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  No seizures  were seen throughout the recording.  Carlina Derks Barbra Sarks

## 2020-12-02 NOTE — Progress Notes (Signed)
Referring Physician(s): Dr. Lavera Guise  Supervising Physician: Katherina Right Lyla Glassing  Patient Status:  [facility] - In-pt  Chief Complaint:  Acute onset of aphasia, right sided weakness. Ns/p cerebral arteriogram with intervention on 12.1.22. with Dr. Harrold Donath with mechanical thrombectomy performed for treatment of a left M3/MCA occlusion.   Subjective:  Unable to assess due to patient condition.  Allergies: Avelox [moxifloxacin], Ciprofloxacin, Floraquin [iodoquinol], Iohexol, Levaquin [levofloxacin], Plavix [clopidogrel], Quinolones, and Shellfish allergy  Medications: Prior to Admission medications   Medication Sig Start Date End Date Taking? Authorizing Provider  aspirin EC 81 MG tablet Take 81 mg by mouth daily. Swallow whole.   Yes [provider]  Barberry-Oreg Grape-Goldenseal (BERBERINE COMPLEX PO) Take 1 tablet by mouth daily.   Yes [provider]  losartan (COZAAR) 25 MG tablet TAKE 1 TABLET(25 MG) BY MOUTH DAILY Patient taking differently: Take 25 mg by mouth daily. 07/21/20  Yes Sagardia, Ines Bloomer, MD  melatonin 5 MG TABS Take 5 mg by mouth at bedtime.   Yes [provider]  Multiple Vitamin (MULTIVITAMIN WITH MINERALS) TABS tablet Take 1 tablet by mouth daily.   Yes [provider]  Multiple Vitamins-Minerals (ZINC PO) Take 1 tablet by mouth daily.   Yes [provider]  Nattokinase 100 MG CAPS Take 200 mg by mouth daily.   Yes [provider]  OVER THE COUNTER MEDICATION Take 15 mg by mouth 2 (two) times daily as needed (anxiety). Cbd oil   Yes [provider]  Turmeric (QC TUMERIC COMPLEX PO) Take 1 tablet by mouth daily.   Yes [provider]  Cholecalciferol (VITAMIN D3 GUMMIES PO) Take 1,000 mcg by mouth daily. Patient not taking: Reported on 12/02/2020    [provider]  Thiamine HCl (VITAMIN B-1 PO) Take 1 tablet by mouth daily. Patient not taking: Reported on  12/02/2020    [provider]     Vital Signs: BP 133/87   Pulse (!) 114   Temp 99.1 F (37.3 C) (Oral)   Resp 14   Wt 125 lb 3.5 oz (56.8 kg)   LMP 01/02/2003   SpO2 98%   BMI 21.49 kg/m   Physical Exam Vitals and nursing note reviewed.  Constitutional:      Appearance: She is well-developed.  HENT:     Head: Normocephalic and atraumatic.  Eyes:     Conjunctiva/sclera: Conjunctivae normal.  Cardiovascular:     Rate and Rhythm: Regular rhythm. Tachycardia present.     Comments: Right femoral access site is soft with no active bleeding and no appreciable pseudoaneurysm. Dressing is C/D/I  Pulmonary:     Effort: Pulmonary effort is normal.  Musculoskeletal:     Cervical back: Normal range of motion.  Neurological:     Mental Status: She is alert. She is disoriented.    Imaging: DG Chest 1 View  Result Date: 12/02/2020 CLINICAL DATA:  Stroke EXAM: CHEST  1 VIEW COMPARISON:  July 20, 2019, October 20, 2020 FINDINGS: Cardiomediastinal silhouette is mildly increased in prominence along the aortic arch, most likely due to a combination of rotation and AP technique. No pleural effusion or pneumothorax. No acute pleuroparenchymal abnormality. Gaseous distension of bowel beneath the LEFT hemidiaphragm. IMPRESSION: 1. Cardiomediastinal silhouette is mildly increased in prominence along the aortic arch, most likely due to a combination of rotation and AP technique. If concern for aortic pathology, recommend dedicated PA and lateral chest radiograph. 2. Otherwise no acute cardiopulmonary abnormality.  Electronically Signed   By: Valentino Saxon M.D.   On: 12/02/2020 07:48   CT HEAD WO CONTRAST (5MM)  Result Date: 12/02/2020 CLINICAL DATA:  Follow-up after neurovascular intervention. EXAM: CT HEAD WITHOUT CONTRAST TECHNIQUE: Contiguous axial images were obtained from the base of the skull through the vertex without intravenous contrast. COMPARISON:  12/01/2020 FINDINGS: Brain:  Allowing for redistribution, unchanged subarachnoid hyperdensity over both hemispheres, left-greater-than-right. A suspected intraparenchymal component in the left parietal lobe is unchanged. There is periventricular hypoattenuation compatible with chronic microvascular disease. Generalized volume loss. No hydrocephalus. Vascular: No abnormal hyperdensity of the major intracranial arteries or dural venous sinuses. No intracranial atherosclerosis. Skull: The visualized skull base, calvarium and extracranial soft tissues are normal. Sinuses/Orbits: No fluid levels or advanced mucosal thickening of the visualized paranasal sinuses. No mastoid or middle ear effusion. The orbits are normal. IMPRESSION: 1. Unchanged subarachnoid hyperdensity over both hemispheres, left-greater-than-right, likely a combination contrast extravasation and subarachnoid blood. 2. Unchanged subcortical intraparenchymal blood in the left parietal lobe. Electronically Signed   By: Ulyses Jarred M.D.   On: 12/02/2020 03:36   CT HEAD WO CONTRAST (5MM)  Result Date: 12/01/2020 CLINICAL DATA:  Postop left MCA thrombectomy. Stroke. Follow-up subarachnoid hemorrhage EXAM: CT HEAD WITHOUT CONTRAST TECHNIQUE: Contiguous axial images were obtained from the base of the skull through the vertex without intravenous contrast. COMPARISON:  CT head earlier today 12/01/2020 FINDINGS: Brain: Subarachnoid hemorrhage and possible subarachnoid contrast on the left is unchanged from earlier today. This is in the sylvian fissure and in the left parietal sulci. Small amount of hemorrhage in the interpeduncular cistern unchanged. Minimal subarachnoid hemorrhage right parietal lobe unchanged. Negative for hydrocephalus. Generalized atrophy. Chronic microvascular ischemic change in the white matter. No acute cortical infarct. Vascular: Negative for hyperdense vessel Skull: Negative Sinuses/Orbits: Paranasal sinuses clear.  Negative orbit Other: None IMPRESSION: 1.  Acute subarachnoid hemorrhage, stable from earlier today. No hydrocephalus or new hemorrhage. 2. No acute ischemic infarct 3. Atrophy and chronic microvascular ischemic change in Electronically Signed   By: Franchot Gallo M.D.   On: 12/01/2020 18:06   CT HEAD WO CONTRAST  Result Date: 12/01/2020 CLINICAL DATA:  Stroke, follow up. Stroke s/p left M3/MCA thrombectomy with trace SAH vs contrast extravasation. Evaluate for hemorrhage progression compared to post procedural flat panel CT. EXAM: CT HEAD WITHOUT CONTRAST TECHNIQUE: Contiguous axial images were obtained from the base of the skull through the vertex without intravenous contrast. COMPARISON:  Flat panel head CT 12/01/2020. And head CT December 01, 2020 at 9:59 a.m. FINDINGS: Brain: There has been interval increase of the hyperdensity in the left sylvian fissure, now extending to the cortical sulci in the left parietal lobe, consistent with subarachnoid hemorrhage. Small amount of blood is also seen in the interpeduncular cistern. There is no hydrocephalus. No large acute territorial infarct identified. Chronic scattered cortical infarcts are unchanged from prior CT. Vascular: Hyperdense vessels are related to recent contrast administration. Skull: Normal. Negative for fracture or focal lesion. Sinuses/Orbits: No acute finding. Other: None. IMPRESSION: Interval increase in size of the left sylvian subarachnoid hemorrhage, now extending to the posterior parietal cortical sulci with small amount of blood in the interpeduncular cistern. No hydrocephalus. These results were discussed by telephone at the time of interpretation on 12/01/2020 at 3:09 pm to provider Dr. Erlinda Hong, who verbally acknowledged these results. Electronically Signed   By: Pedro Earls M.D.   On: 12/01/2020 15:09   MR ANGIO HEAD WO CONTRAST  Result Date:  12/02/2020 CLINICAL DATA:  Acute onset of aphasia, history of strokes EXAM: MRI HEAD WITHOUT CONTRAST MRA HEAD WITHOUT  CONTRAST TECHNIQUE: Multiplanar, multi-echo pulse sequences of the brain and surrounding structures were acquired without intravenous contrast. Angiographic images of the Circle of Willis were acquired using MRA technique without intravenous contrast. COMPARISON:  Same-day noncontrast head CT, brain MRI 06/25/2018 FINDINGS: MRI HEAD FINDINGS Brain: There are scattered small areas of diffusion restriction throughout the left MCA distribution with involvement of the insular cortex, anterior frontal lobe and parietal lobe cortex, and corona radiata, consistent with evolving acute infarcts. Additional diffusion restriction with corresponding SWI signal dropout in the left parietal lobe superiorly is likely due to intraparenchymal blood as seen on prior CT. Diffusion restriction in the left temporal lobe is also likely at least in part due to blood products. Sulcal FLAIR hyperintensity and associated SWI signal dropout overlying the left cerebral hemisphere and sylvian fissure as well as the right parietotemporal region is consistent with subarachnoid blood, grossly unchanged compared to the prior CT allowing for difference in modality. There is a background of global parenchymal volume loss and chronic white matter microangiopathy, unchanged. There is no solid mass lesion. There is no midline shift. Vascular: Normal flow voids. Skull and upper cervical spine: Normal marrow signal. Sinuses/Orbits: The paranasal sinuses are clear. The globes and orbits are unremarkable. Other: None. MRA HEAD FINDINGS Anterior circulation: The intracranial ICAs are patent. The left M1 and proximal M2 segments are patent. There has been interval recanalization of the left inferior M2 division which was occluded on the prior CTA. Is no evidence of branch vessel occlusion on the current study. The right MCA is patent The bilateral ACAs are patent. There is no aneurysm. Posterior circulation: The bilateral V4 segments are patent. The basilar  artery is patent. The bilateral PCAs are patent. A left posterior communicating artery is identified. The right posterior communicating artery is not definitely seen. Anatomic variants: None. IMPRESSION: 1. Scattered small acute infarcts in the left MCA distribution as above. 2. Subarachnoid hemorrhage overlying the bilateral cerebral hemispheres, not significantly changed compared to the prior CT allowing for difference in modality. The small focus of intraparenchymal hemorrhage in the left parietal lobe is also unchanged. 3. Interval recanalization of the previously occluded left inferior M2 division. No high-grade stenosis or occlusion on the current study. Electronically Signed   By: Valetta Mole M.D.   On: 12/02/2020 10:52   MR BRAIN WO CONTRAST  Result Date: 12/02/2020 CLINICAL DATA:  Acute onset of aphasia, history of strokes EXAM: MRI HEAD WITHOUT CONTRAST MRA HEAD WITHOUT CONTRAST TECHNIQUE: Multiplanar, multi-echo pulse sequences of the brain and surrounding structures were acquired without intravenous contrast. Angiographic images of the Circle of Willis were acquired using MRA technique without intravenous contrast. COMPARISON:  Same-day noncontrast head CT, brain MRI 06/25/2018 FINDINGS: MRI HEAD FINDINGS Brain: There are scattered small areas of diffusion restriction throughout the left MCA distribution with involvement of the insular cortex, anterior frontal lobe and parietal lobe cortex, and corona radiata, consistent with evolving acute infarcts. Additional diffusion restriction with corresponding SWI signal dropout in the left parietal lobe superiorly is likely due to intraparenchymal blood as seen on prior CT. Diffusion restriction in the left temporal lobe is also likely at least in part due to blood products. Sulcal FLAIR hyperintensity and associated SWI signal dropout overlying the left cerebral hemisphere and sylvian fissure as well as the right parietotemporal region is consistent with  subarachnoid blood, grossly unchanged compared  to the prior CT allowing for difference in modality. There is a background of global parenchymal volume loss and chronic white matter microangiopathy, unchanged. There is no solid mass lesion. There is no midline shift. Vascular: Normal flow voids. Skull and upper cervical spine: Normal marrow signal. Sinuses/Orbits: The paranasal sinuses are clear. The globes and orbits are unremarkable. Other: None. MRA HEAD FINDINGS Anterior circulation: The intracranial ICAs are patent. The left M1 and proximal M2 segments are patent. There has been interval recanalization of the left inferior M2 division which was occluded on the prior CTA. Is no evidence of branch vessel occlusion on the current study. The right MCA is patent The bilateral ACAs are patent. There is no aneurysm. Posterior circulation: The bilateral V4 segments are patent. The basilar artery is patent. The bilateral PCAs are patent. A left posterior communicating artery is identified. The right posterior communicating artery is not definitely seen. Anatomic variants: None. IMPRESSION: 1. Scattered small acute infarcts in the left MCA distribution as above. 2. Subarachnoid hemorrhage overlying the bilateral cerebral hemispheres, not significantly changed compared to the prior CT allowing for difference in modality. The small focus of intraparenchymal hemorrhage in the left parietal lobe is also unchanged. 3. Interval recanalization of the previously occluded left inferior M2 division. No high-grade stenosis or occlusion on the current study. Electronically Signed   By: Valetta Mole M.D.   On: 12/02/2020 10:52   IR CT Head Ltd  Result Date: 12/02/2020 INDICATION: 65 year old female with past medical history significant for anemia, anxiety, depression, two prior strokes in the past 14 months (one presenting with right sided weakness, followed by improvement to mild residual deficit; the second with left sided  weakness), HTN, loop recorder implant in October of 2021, osteoporosis and thrombocytopenia. Two presented to emergency by EMS with acute onset of aphasia, right side weakness and incoordination. NIHSS 6 at presentation. Head CT showed no large acute territorial infarct or hemorrhage. CT angiogram of the head and neck showed a left M3/MCA occlusion. She received TNK at 10:20 a.m. on 12/01/2020. Given disabling symptoms, decision was made to proceed with diagnostic cerebral angiogram and mechanical thrombectomy. EXAM: ULTRASOUND-GUIDED VASCULAR ACCESS DIAGNOSTIC CEREBRAL ANGIOGRAM MECHANICAL THROMBECTOMY FLAT PANEL HEAD CT COMPARISON:  CT/CT angiogram of the head and neck December 01, 2020. MEDICATIONS: No antibiotics administered. ANESTHESIA/SEDATION: The procedure was performed under general anesthesia. CONTRAST:  42 mL of Omnipaque 300 milligram/mL FLUOROSCOPY TIME:  Fluoroscopy Time: 9 minutes 24 seconds (476 mGy). COMPLICATIONS: SIR LEVEL B - Normal therapy, includes overnight admission for observation. TECHNIQUE: Informed written consent was obtained from the patient's husband after a thorough discussion of the procedural risks, benefits and alternatives. All questions were addressed. Maximal Sterile Barrier Technique was utilized including caps, mask, sterile gowns, sterile gloves, sterile drape, hand hygiene and skin antiseptic. A timeout was performed prior to the initiation of the procedure. The right groin was prepped and draped in the usual sterile fashion. Using a micropuncture kit and the modified Seldinger technique, access was gained to the right common femoral artery and an 8 French sheath was placed. Real-time ultrasound guidance was utilized for vascular access including the acquisition of a permanent ultrasound image documenting patency of the accessed vessel. Under fluoroscopy, a Zoom 88 guide catheter was navigated over a 6 [place] Berenstein 2 catheter and a 0.035" Terumo Glidewire into the  aortic arch. The catheter was placed into the left common carotid artery and then advanced into the left internal carotid artery. The inner catheter was  removed. Frontal and lateral angiograms of the head were obtained. FINDINGS: 1. Normal caliber of the right common femoral artery, adequate for vascular access. 2. 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 PROCEDURE: Under biplane roadmap, a Zoom 35 aspiration catheter was navigated over an Aristotle 14 microguidewire into the cavernous segment of the right ICA. The aspiration catheter was then advanced to the distal left M2/MCA inferior division branch. However, catheter did not track to the level of occlusion. The catheter was subsequently removed. Then, a phenom 21 microcatheter was navigated over an Aristotle 14 micro guidewire into the left M3/MCA superior division branch, past the level of occlusion. Then, a 3 mm solitaire stent retriever was deployed across the occluded segment. The device was allowed to intercalated with the clot for 3 minute. Then, the catheter was gently retracted while continuous aspiration was applied to the guide catheter. Left ICA angiograms showed recanalization of the occluded segment with normal flow in the main branch and slow flow in a side branch (TICI 2C). Flat panel CT of the head was obtained and post processed in a separate workstation with concurrent attending physician supervision. Selected images were sent to PACS. Trace subarachnoid hemorrhage versus contrast extravasation noted in the left sylvian fissure. Follow-up left ICA angiograms with frontal and lateral views of the head showed resolution of slow flow in the side branch now with brisk contrast opacification with slow flow in the main branch likely related to basal spasm. Decision made not to treat vasospasm due to small subarachnoid hemorrhage seen on CT. The catheter was subsequently withdrawn. A right common femoral artery angiogram was obtained with  right anterior oblique view. The puncture is at the level of the mid right common femoral artery which has normal caliber, adequate for closure device. The femoral sheath was exchanged over the wire for a Perclose ProGlide which was utilized for access closure. Immediate hemostasis was achieved. IMPRESSION: Successful mechanical thrombectomy performed for treatment of a left M3/MCA occlusion. Final angiogram showed vasospasm with delayed flow to the branch. Small left sylvian subarachnoid hemorrhage versus contrast extravasation on postprocedural flat panel head CT. PLAN: 1. Transferred to ICU for continued monitoring. 2. Head CT in 3 hours to evaluate for stability of contrast extravasation/subarachnoid hemorrhage. 3. Strict blood pressure control 120-140 mm Hg. Electronically Signed   By: Pedro Earls M.D.   On: 12/02/2020 10:07   IR US Guide Vasc Access Right  Result Date: 12/02/2020 INDICATION: 65 year old female with past medical history significant for anemia, anxiety, depression, two prior strokes in the past 14 months (one presenting with right sided weakness, followed by improvement to mild residual deficit; the second with left sided weakness), HTN, loop recorder implant in October of 2021, osteoporosis and thrombocytopenia. Two presented to emergency by EMS with acute onset of aphasia, right side weakness and incoordination. NIHSS 6 at presentation. Head CT showed no large acute territorial infarct or hemorrhage. CT angiogram of the head and neck showed a left M3/MCA occlusion. She received TNK at 10:20 a.m. on 12/01/2020. Given disabling symptoms, decision was made to proceed with diagnostic cerebral angiogram and mechanical thrombectomy. EXAM: ULTRASOUND-GUIDED VASCULAR ACCESS DIAGNOSTIC CEREBRAL ANGIOGRAM MECHANICAL THROMBECTOMY FLAT PANEL HEAD CT COMPARISON:  CT/CT angiogram of the head and neck December 01, 2020. MEDICATIONS: No antibiotics administered. ANESTHESIA/SEDATION: The  procedure was performed under general anesthesia. CONTRAST:  42 mL of Omnipaque 300 milligram/mL FLUOROSCOPY TIME:  Fluoroscopy Time: 9 minutes 24 seconds (476 mGy). COMPLICATIONS: SIR LEVEL B - Normal  therapy, includes overnight admission for observation. TECHNIQUE: Informed written consent was obtained from the patient's husband after a thorough discussion of the procedural risks, benefits and alternatives. All questions were addressed. Maximal Sterile Barrier Technique was utilized including caps, mask, sterile gowns, sterile gloves, sterile drape, hand hygiene and skin antiseptic. A timeout was performed prior to the initiation of the procedure. The right groin was prepped and draped in the usual sterile fashion. Using a micropuncture kit and the modified Seldinger technique, access was gained to the right common femoral artery and an 8 French sheath was placed. Real-time ultrasound guidance was utilized for vascular access including the acquisition of a permanent ultrasound image documenting patency of the accessed vessel. Under fluoroscopy, a Zoom 88 guide catheter was navigated over a 6 [place] Berenstein 2 catheter and a 0.035" Terumo Glidewire into the aortic arch. The catheter was placed into the left common carotid artery and then advanced into the left internal carotid artery. The inner catheter was removed. Frontal and lateral angiograms of the head were obtained. FINDINGS: 1. Normal caliber of the right common femoral artery, adequate for vascular access. 2. 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 PROCEDURE: Under biplane roadmap, a Zoom 35 aspiration catheter was navigated over an Aristotle 14 microguidewire into the cavernous segment of the right ICA. The aspiration catheter was then advanced to the distal left M2/MCA inferior division branch. However, catheter did not track to the level of occlusion. The catheter was subsequently removed. Then, a phenom 21 microcatheter was navigated  over an Aristotle 14 micro guidewire into the left M3/MCA superior division branch, past the level of occlusion. Then, a 3 mm solitaire stent retriever was deployed across the occluded segment. The device was allowed to intercalated with the clot for 3 minute. Then, the catheter was gently retracted while continuous aspiration was applied to the guide catheter. Left ICA angiograms showed recanalization of the occluded segment with normal flow in the main branch and slow flow in a side branch (TICI 2C). Flat panel CT of the head was obtained and post processed in a separate workstation with concurrent attending physician supervision. Selected images were sent to PACS. Trace subarachnoid hemorrhage versus contrast extravasation noted in the left sylvian fissure. Follow-up left ICA angiograms with frontal and lateral views of the head showed resolution of slow flow in the side branch now with brisk contrast opacification with slow flow in the main branch likely related to basal spasm. Decision made not to treat vasospasm due to small subarachnoid hemorrhage seen on CT. The catheter was subsequently withdrawn. A right common femoral artery angiogram was obtained with right anterior oblique view. The puncture is at the level of the mid right common femoral artery which has normal caliber, adequate for closure device. The femoral sheath was exchanged over the wire for a Perclose ProGlide which was utilized for access closure. Immediate hemostasis was achieved. IMPRESSION: Successful mechanical thrombectomy performed for treatment of a left M3/MCA occlusion. Final angiogram showed vasospasm with delayed flow to the branch. Small left sylvian subarachnoid hemorrhage versus contrast extravasation on postprocedural flat panel head CT. PLAN: 1. Transferred to ICU for continued monitoring. 2. Head CT in 3 hours to evaluate for stability of contrast extravasation/subarachnoid hemorrhage. 3. Strict blood pressure control 120-140  mm Hg. Electronically Signed   By: Pedro Earls M.D.   On: 12/02/2020 10:07   EEG adult  Result Date: 12/02/2020 Jacqueline Havens, MD     12/02/2020  1:09 PM  Patient Name: Jacqueline Bonilla MRN: [id number] Epilepsy Attending: Lora Bonilla Referring Physician/Provider: Dr. Rosalin Hawking Date: 12/02/2020 Duration: 41.54 mins Patient history: 65 year old female with facial twitching and left gaze preference. EEG evaluate for seizure. Level of alertness: Awake, asleep AEDs during EEG study: LEV, ativan Technical aspects: This EEG study was done with scalp electrodes positioned according to the 10-20 International system of electrode placement. Electrical activity was acquired at a sampling rate of _0  and reviewed with a high frequency filter of _1  and a low frequency filter of _2 . EEG data were recorded continuously and digitally stored. Description: The posterior dominant rhythm consists of 9-10 Hz activity of moderate voltage (25-35 uV) seen predominantly in posterior head regions, asymmetric ( L<R) and reactive to eye opening and eye closing. Sleep was characterized by vertex waves, sleep spindles (12 to 14 Hz), maximal frontocentral region. EEG showed continuous 3 to 6 Hz theta-delta slowing in left frontotemporal region.  Frequent spikes were also noted in left frontotemporal region.  Hyperventilation and photic stimulation were not performed.   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  No seizures  were seen throughout the recording. Priyanka O Yadav   IR PERCUTANEOUS ART THROMBECTOMY/INFUSION INTRACRANIAL INC DIAG ANGIO  Result Date: 12/02/2020 INDICATION: 65 year old female with past medical history significant for anemia, anxiety, depression, two  prior strokes in the past 14 months (one presenting with right sided weakness, followed by improvement to mild residual deficit; the second with left sided weakness), HTN, loop recorder implant in October of 2021, osteoporosis and thrombocytopenia. Two presented to emergency by EMS with acute onset of aphasia, right side weakness and incoordination. NIHSS 6 at presentation. Head CT showed no large acute territorial infarct or hemorrhage. CT angiogram of the head and neck showed a left M3/MCA occlusion. She received TNK at 10:20 a.m. on 12/01/2020. Given disabling symptoms, decision was made to proceed with diagnostic cerebral angiogram and mechanical thrombectomy. EXAM: ULTRASOUND-GUIDED VASCULAR ACCESS DIAGNOSTIC CEREBRAL ANGIOGRAM MECHANICAL THROMBECTOMY FLAT PANEL HEAD CT COMPARISON:  CT/CT angiogram of the head and neck December 01, 2020. MEDICATIONS: No antibiotics administered. ANESTHESIA/SEDATION: The procedure was performed under general anesthesia. CONTRAST:  42 mL of Omnipaque 300 milligram/mL FLUOROSCOPY TIME:  Fluoroscopy Time: 9 minutes 24 seconds (476 mGy). COMPLICATIONS: SIR LEVEL B - Normal therapy, includes overnight admission for observation. TECHNIQUE: Informed written consent was obtained from the patient's husband after a thorough discussion of the procedural risks, benefits and alternatives. All questions were addressed. Maximal Sterile Barrier Technique was utilized including caps, mask, sterile gowns, sterile gloves, sterile drape, hand hygiene and skin antiseptic. A timeout was performed prior to the initiation of the procedure. The right groin was prepped and draped in the usual sterile fashion. Using a micropuncture kit and the modified Seldinger technique, access was gained to the right common femoral artery and an 8 French sheath was placed. Real-time ultrasound guidance was utilized for vascular access including the acquisition of a permanent ultrasound image documenting patency of the  accessed vessel. Under fluoroscopy, a Zoom 88 guide catheter was navigated over a 6 [place] Berenstein 2 catheter and a 0.035" Terumo Glidewire into the aortic arch. The catheter was placed into the left common carotid artery and then advanced into the left internal carotid artery. The inner catheter was removed. Frontal and lateral angiograms of the head were obtained. FINDINGS:  1. Normal caliber of the right common femoral artery, adequate for vascular access. 2. 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 PROCEDURE: Under biplane roadmap, a Zoom 35 aspiration catheter was navigated over an Aristotle 14 microguidewire into the cavernous segment of the right ICA. The aspiration catheter was then advanced to the distal left M2/MCA inferior division branch. However, catheter did not track to the level of occlusion. The catheter was subsequently removed. Then, a phenom 21 microcatheter was navigated over an Aristotle 14 micro guidewire into the left M3/MCA superior division branch, past the level of occlusion. Then, a 3 mm solitaire stent retriever was deployed across the occluded segment. The device was allowed to intercalated with the clot for 3 minute. Then, the catheter was gently retracted while continuous aspiration was applied to the guide catheter. Left ICA angiograms showed recanalization of the occluded segment with normal flow in the main branch and slow flow in a side branch (TICI 2C). Flat panel CT of the head was obtained and post processed in a separate workstation with concurrent attending physician supervision. Selected images were sent to PACS. Trace subarachnoid hemorrhage versus contrast extravasation noted in the left sylvian fissure. Follow-up left ICA angiograms with frontal and lateral views of the head showed resolution of slow flow in the side branch now with brisk contrast opacification with slow flow in the main branch likely related to basal spasm. Decision made not to treat  vasospasm due to small subarachnoid hemorrhage seen on CT. The catheter was subsequently withdrawn. A right common femoral artery angiogram was obtained with right anterior oblique view. The puncture is at the level of the mid right common femoral artery which has normal caliber, adequate for closure device. The femoral sheath was exchanged over the wire for a Perclose ProGlide which was utilized for access closure. Immediate hemostasis was achieved. IMPRESSION: Successful mechanical thrombectomy performed for treatment of a left M3/MCA occlusion. Final angiogram showed vasospasm with delayed flow to the branch. Small left sylvian subarachnoid hemorrhage versus contrast extravasation on postprocedural flat panel head CT. PLAN: 1. Transferred to ICU for continued monitoring. 2. Head CT in 3 hours to evaluate for stability of contrast extravasation/subarachnoid hemorrhage. 3. Strict blood pressure control 120-140 mm Hg. Electronically Signed   By: Pedro Earls M.D.   On: 12/02/2020 10:07   CT HEAD CODE STROKE WO CONTRAST  Result Date: 12/01/2020 CLINICAL DATA:  Code stroke.  Aphasia, right-sided weakness EXAM: CT HEAD WITHOUT CONTRAST TECHNIQUE: Contiguous axial images were obtained from the base of the skull through the vertex without intravenous contrast. COMPARISON:  06/24/2020 FINDINGS: Brain: There is no acute intracranial hemorrhage, mass effect, or edema. No new loss of gray-white differentiation. Prominence of the ventricles and sulci reflects similar parenchymal volume loss. Scattered chronic cortical infarcts. Small chronic infarct of the body of the left caudate. Small chronic right greater than left cerebellar infarcts. No extra-axial collection. Vascular: No hyperdense vessel. There is intracranial atherosclerotic calcification at the skull base. Skull: Unremarkable. Sinuses/Orbits: No acute abnormality. Other: Mastoid air cells are clear. ASPECTS ([place] Stroke Program Early CT  Score) - Ganglionic level infarction (caudate, lentiform nuclei, internal capsule, insula, M1-M3 cortex): 7 - Supraganglionic infarction (M4-M6 cortex): 3 Total score (0-10 with 10 being normal): 10 IMPRESSION: There is no acute intracranial hemorrhage or evidence of acute infarction. ASPECT score is 10. These results were communicated to Dr. Cheral Marker at 10:11 am on 12/01/2020 by text page via the [facility] messaging system. Electronically Signed  By: Macy Mis M.D.   On: 12/01/2020 10:12   CT ANGIO HEAD NECK W WO CM W PERF (CODE STROKE)  Result Date: 12/01/2020 CLINICAL DATA:  Neuro deficit, acute, stroke suspected EXAM: CT ANGIOGRAPHY HEAD AND NECK CT PERFUSION BRAIN TECHNIQUE: Multidetector CT imaging of the head and neck was performed using the standard protocol during bolus administration of intravenous contrast. Multiplanar CT image reconstructions and MIPs were obtained to evaluate the vascular anatomy. Carotid stenosis measurements (when applicable) are obtained utilizing NASCET criteria, using the distal internal carotid diameter as the denominator. Multiphase CT imaging of the brain was performed following IV bolus contrast injection. Subsequent parametric perfusion maps were calculated using RAPID software. CONTRAST:  11m OMNIPAQUE IOHEXOL 350 MG/ML SOLN COMPARISON:  June 2021 FINDINGS: CTA NECK Aortic arch: Minimal calcified plaque along the arch. Great vessel origins are patent. Right carotid system: Patent. Trace plaque at the ICA origin. No stenosis. Left carotid system: Patent.  No stenosis. Vertebral arteries: Patent and codominant.  No stenosis. Skeleton: Mild cervical spine degenerative changes. Other neck: Unremarkable. Upper chest: No apical lung mass.  Patulous esophagus. Review of the MIP images confirms the above findings CTA HEAD Mild motion artifact is present. Anterior circulation: Intracranial internal carotid arteries are patent. Mild calcified plaque is present without significant  stenosis. Left M1 MCA is patent. There is occlusion of distal left M2 MCA branch at the posterior aspect of the sylvian fissure (for example see series 12, image 28). Posterior circulation: Intracranial vertebral arteries are patent. Basilar artery is patent. Major cerebellar artery origins are patent. Posterior cerebral arteries are patent. Venous sinuses: Patent as allowed by contrast bolus timing. Review of the MIP images confirms the above findings CT Brain Perfusion Findings: CBF (<30%) Volume: 069mPerfusion (Tmax>6.0s) volume: 1635mismatch Volume: 31m99mfarction Location: Posterior left MCA territory IMPRESSION: Occlusion of distal left M2 MCA branch at the posterior aspect of the sylvian fissure (see saved key images). Perfusion imaging demonstrates no core infarction and 16 mL of penumbra within the posterior left MCA territory. No hemodynamically significant stenosis in the neck. These results were called by telephone at the time of interpretation on 12/01/2020 at 10:27 am to provider ER[facility]INDVer[facility] Centerho verbally acknowledged these results. Electronically Signed   By: PranMacy Mis.   On: 12/01/2020 10:34    Labs:  CBC: Recent Labs    07/04/20 0502 08/25/20 1429 12/01/20 0957 12/01/20 1001 12/02/20 0440  WBC 5.5 5.7  --  7.2 11.3*  HGB 11.4* 12.7 12.9 12.5 11.2*  HCT 35.9* 38.9 38.0 38.8 34.4*  PLT 254 211.0  --  191 214    COAGS: Recent Labs    06/24/20 1100 12/01/20 1001  INR 1.0 1.0  APTT 25 36    BMP: Recent Labs    06/29/20 0511 07/04/20 0502 08/25/20 1429 12/01/20 0957 12/01/20 1001 12/02/20 0440  NA 137 140 138 140 139 137  K 3.8 4.2 4.1 4.2 4.2 3.6  CL 104 105 102 103 104 105  CO2 _0 --  27 24  GLUCOSE 89 100* 102* 131* 132* 118*  BUN 27* 26* 27* 34* 31* 19  CALCIUM 9.0 9.3 10.1  --  9.5 8.7*  CREATININE 0.94 1.00 1.01 1.10* 1.01* 0.87  GFRNONAA >60 >60  --   --  >60 >60    LIVER FUNCTION TESTS: Recent Labs    06/24/20 1100 06/29/20 0511  08/25/20 1429 12/01/20 1001  BILITOT 0.7 0.5 0.4 0.6  AST _0 ALT _1 ALKPHOS 62 46 58 54  PROT 7.4 6.1* 7.8 7.1  ALBUMIN 3.7 3.0* 4.2 3.7    Assessment and Plan:  65 y.o. female inpatient. History of anemia, anxiety CVA X 2, HTN, thrombocytopenia. Presented to the ED at [facility] on 12.1.22 with acute onset of aphasia, right sided weakness. NIHSS 6. NIR performed a cerebral arteriogram with intervention on 12.1.22. Per notes from Dr. Raliegh Ip. Karenann Cai successful mechanical thrombectomy performed for treatment of a left M3/MCA occlusion. Final angiogram showed vasospasm with delayed flow to the branch. Small left sylvian subarachnoid hemorrhage versus contrast extravasation on postprocedural flat panel head CT. Post intervention CT head from 12.2.22 reads Unchanged subarachnoid hyperdensity over both hemispheres, left-greater-than-right, likely a combination contrast extravasation and subarachnoid blood. Unchanged subcortical intraparenchymal blood in the left parietal lobe. MR Angio head  from 12.2.22 reads Unchanged subarachnoid hyperdensity over both hemispheres, left-greater-than-right, likely a combination contrast extravasation and subarachnoid blood. Unchanged subcortical intraparenchymal blood in the left parietal lobe.  Patient seen at bedside with Dr. Raliegh Ip. Karenann Cai. Patient alert but altered and disoriented.  Attempted to get out bed. Garbled language. Able to move all 4 extremities. Right groin access site is soft with no active bleeding and no appreciable pseudoaneurysm. Dressing is C/D/I/.   Patient currently being evaluated for seizures like activity.  Patient to stable from IR perspective s/p cerebral arteriogram with  further plans per primary Team please call IR with questions or concerns.   Electronically Signed: Jacqualine Mau, NP 12/02/2020, 3:09 PM   I spent a total of 15 Minutes at the patient's bedside AND on the patient's hospital  floor or unit, greater than 50% of which was counseling/coordinating care for cerebral arteriogram with thrombectomy of M3/MCA occlusion.

## 2020-12-02 NOTE — Progress Notes (Signed)
Patient very restless and agitated several times today. MD paged. PRN order written, posey belt placed, bed alarm on and floor mats in place. Family is at the bedside

## 2020-12-02 NOTE — Progress Notes (Signed)
At 0200, neuro change was assessed on pt. Previously could respond verbally to orientation questions, but now has no verbal output & stares blankly, follows no commands. Pt unable to move R arm/leg as previously able to. VS were stable and unchanged from previous shift assessments. MD was paged; STAT CT head was ordered. MD arrived to assess pt at CT, pt had new seizure like activity present. Order for 1 mg ativan IV and 2,000 mg Keppra IV were received. Pt arrived back to 4N ICU, where orders were completed. Further instructed by MD to give an additional dose of 1 mg Ativan, and to await completion of Keppra infusion for reassessment of pt. VS stable now. Will continue to monitor for further changes.   Royal Piedra, RN

## 2020-12-02 NOTE — Progress Notes (Signed)
PT Cancellation Note  Patient Details Name: Jacqueline Bonilla MRN: [id number] DOB: April 20, 1955   Cancelled Treatment:    Reason Eval/Treat Not Completed: Other (comment) Patient being hooked up to EEG. PT will follow up at later time.   Carliss Quast A. Gilford Rile PT, DPT Acute Rehabilitation Services Pager [phone] Office [phone]    Linna Hoff 12/02/2020, 11:16 AM

## 2020-12-02 NOTE — Progress Notes (Signed)
PT Cancellation Note  Patient Details Name: Jacqueline Bonilla MRN: [id number] DOB: 09/11/1955   Cancelled Treatment:    Reason Eval/Treat Not Completed: Active bedrest order PT will assess once activity orders are updated.   Burech Mcfarland A. Gilford Rile PT, DPT Acute Rehabilitation Services Pager [phone] Office [phone]    Jacqueline Bonilla 12/02/2020, 8:21 AM

## 2020-12-02 NOTE — Progress Notes (Signed)
OT Cancellation Note  Patient Details Name: Jacqueline Bonilla MRN: [id number] DOB: Jan 03, 1955   Cancelled Treatment:    Reason Eval/Treat Not Completed: Patient at procedure or test/ unavailable (Being hooked up to EEG. Will follow up later time)  [place], OT/L   Acute OT Clinical Specialist [place] Pager [phone] Office [phone]  12/02/2020, 11:12 AM

## 2020-12-02 NOTE — Progress Notes (Signed)
SLP Cancellation Note  Patient Details Name: Jacqueline Bonilla MRN: [id number] DOB: 27-Feb-1955   Cancelled treatment:       Reason Eval/Treat Not Completed: Patient at procedure or test/unavailable- will continue efforts.  [name] L. Tivis Ringer, [place] Office number [phone] Pager [phone]    [name] Laurice 12/02/2020, 11:32 AM

## 2020-12-02 NOTE — Progress Notes (Signed)
OT Cancellation Note  Patient Details Name: LALLA LAHAM MRN: [id number] DOB: 12/09/1955   Cancelled Treatment:    Reason Eval/Treat Not Completed: Active bedrest order. OT will assess once activity orders updated. thanks  Jeff, OT/L   Acute OT Clinical Specialist [place] Pager [phone] Office [phone]  12/02/2020, 7:41 AM

## 2020-12-02 NOTE — Procedures (Signed)
Cortrak  Person Inserting Tube:  Esaw Dace, RD Tube Type:  Cortrak - 43 inches Tube Size:  10 Tube Location:  Left nare Initial Placement:  Stomach Secured by: Bridle Technique Used to Measure Tube Placement:  Marking at nare/corner of mouth Cortrak Secured At:  74 cm Procedure Comments:  Cortrak Tube Team Note:  Consult received to place a Cortrak feeding tube.   X-ray is required, abdominal x-ray has been ordered by the Cortrak team. Please confirm tube placement before using the Cortrak tube.   If the tube becomes dislodged please keep the tube and contact the Cortrak team at [email or web address] (password [email or web address]) for replacement.  If after hours and replacement cannot be delayed, place a NG tube and confirm placement with an abdominal x-ray.   Kerman Passey MS, RDN, LDN, CNSC Registered Dietitian III Clinical Nutrition RD Pager and On-Call Pager Number Located in [place]

## 2020-12-03 ENCOUNTER — Inpatient Hospital Stay (HOSPITAL_COMMUNITY): Payer: Medicare HMO

## 2020-12-03 DIAGNOSIS — I6389 Other cerebral infarction: Secondary | ICD-10-CM | POA: Diagnosis not present

## 2020-12-03 DIAGNOSIS — I639 Cerebral infarction, unspecified: Secondary | ICD-10-CM

## 2020-12-03 DIAGNOSIS — I63412 Cerebral infarction due to embolism of left middle cerebral artery: Principal | ICD-10-CM

## 2020-12-03 LAB — GLUCOSE, CAPILLARY
Glucose-Capillary: 135 mg/dL — ABNORMAL HIGH (ref 70–99)
Glucose-Capillary: 144 mg/dL — ABNORMAL HIGH (ref 70–99)
Glucose-Capillary: 142 mg/dL — ABNORMAL HIGH (ref 70–99)
Glucose-Capillary: 135 mg/dL — ABNORMAL HIGH (ref 70–99)
Glucose-Capillary: 117 mg/dL — ABNORMAL HIGH (ref 70–99)

## 2020-12-03 LAB — CBC
HCT: 34.4 % — ABNORMAL LOW (ref 36.0–46.0)
Hemoglobin: 11.1 g/dL — ABNORMAL LOW (ref 12.0–15.0)
MCH: 30.2 pg (ref 26.0–34.0)
MCHC: 32.3 g/dL (ref 30.0–36.0)
MCV: 93.5 fL (ref 80.0–100.0)
Platelets: 190 10*3/uL (ref 150–400)
RBC: 3.68 MIL/uL — ABNORMAL LOW (ref 3.87–5.11)
RDW: 13.4 % (ref 11.5–15.5)
WBC: 8.2 10*3/uL (ref 4.0–10.5)
nRBC: 0 % (ref 0.0–0.2)

## 2020-12-03 LAB — ECHOCARDIOGRAM COMPLETE
AR max vel: 2.78 cm2
AV Peak grad: 5.9 mmHg
Ao pk vel: 1.21 m/s
Area-P 1/2: 5.88 cm2
Calc EF: 58.3 %
S' Lateral: 2.9 cm
Single Plane A2C EF: 57.5 %
Single Plane A4C EF: 57.8 %
Weight: 2003.54 oz

## 2020-12-03 LAB — BASIC METABOLIC PANEL
Anion gap: 7 (ref 5–15)
BUN: 20 mg/dL (ref 8–23)
CO2: 26 mmol/L (ref 22–32)
Calcium: 8.7 mg/dL — ABNORMAL LOW (ref 8.9–10.3)
Chloride: 103 mmol/L (ref 98–111)
Creatinine, Ser: 0.87 mg/dL (ref 0.44–1.00)
GFR, Estimated: >60 mL/min (ref >60)
Glucose, Bld: 121 mg/dL — ABNORMAL HIGH (ref 70–99)
Potassium: 4.2 mmol/L (ref 3.5–5.1)
Sodium: 136 mmol/L (ref 135–145)

## 2020-12-03 LAB — MAGNESIUM
Magnesium: 1.9 mg/dL (ref 1.7–2.4)
Magnesium: 1.8 mg/dL (ref 1.7–2.4)

## 2020-12-03 LAB — PHOSPHORUS
Phosphorus: 3.9 mg/dL (ref 2.5–4.6)
Phosphorus: 3.4 mg/dL (ref 2.5–4.6)

## 2020-12-03 MED ORDER — LOSARTAN POTASSIUM 25 MG PO TABS
25.0000 mg | ORAL_TABLET | Freq: Every day | ORAL | Status: DC
  Administered 2020-12-03 – 2020-12-07 (×5): 25 mg via ORAL
  Filled 2020-12-03 (×6): qty 1

## 2020-12-03 MED ORDER — BETHANECHOL CHLORIDE 10 MG PO TABS
5.0000 mg | ORAL_TABLET | Freq: Three times a day (TID) | ORAL | Status: DC
  Administered 2020-12-03 – 2020-12-07 (×12): 5 mg via ORAL
  Filled 2020-12-03 (×14): qty 1

## 2020-12-03 MED ORDER — TAMSULOSIN HCL 0.4 MG PO CAPS
0.4000 mg | ORAL_CAPSULE | Freq: Every day | ORAL | Status: DC
  Administered 2020-12-03 – 2020-12-07 (×4): 0.4 mg via ORAL
  Filled 2020-12-03 (×6): qty 1

## 2020-12-03 NOTE — Progress Notes (Addendum)
None STROKE TEAM PROGRESS NOTE   SUBJECTIVE (INTERVAL HISTORY) Discontinue cleviprex and utilize PRN medications as appropriate. Husband at the bedside. Patient passed swallow screen and diet advanced to regular with thins. Patient is more interactive today and she is ale to communicating appropriately. Patient did have an episode agitation overnight and required a dose of haldol. Consider seroquel if agitation continues during the night.  OBJECTIVE Vitals:   12/03/20 0500 12/03/20 0600 12/03/20 0700 12/03/20 0800  BP: 102/65 112/72 114/65 123/68  Pulse: 78 86 82 83  Resp: 11 12 13 12   Temp:    99.9 F (37.7 C)  TempSrc:    Axillary  SpO2: 100% 99% 100% 99%  Weight:        CBC:  Recent Labs  Lab 12/01/20 1001 12/02/20 0440 12/03/20 0423  WBC 7.2 11.3* 8.2  NEUTROABS 5.1  --   --   HGB 12.5 11.2* 11.1*  HCT 38.8 34.4* 34.4*  MCV 93.9 92.2 93.5  PLT 191 214 190     Basic Metabolic Panel:  Recent Labs  Lab 12/02/20 0440 12/02/20 1205 12/02/20 1700 12/03/20 0423  NA 137  --   --  136  K 3.6  --   --  4.2  CL 105  --   --  103  CO2 24  --   --  26  GLUCOSE 118*  --   --  121*  BUN 19  --   --  20  CREATININE 0.87  --   --  0.87  CALCIUM 8.7*  --   --  8.7*  MG  --    < > 1.9 1.9  PHOS  --    < > 3.4 3.9   < > = values in this interval not displayed.     Lipid Panel:  Recent Labs  Lab 12/02/20 0440  CHOL 153  TRIG 81  HDL 48  CHOLHDL 3.2  VLDL 16  LDLCALC 89    HgbA1c:  Lab Results  Component Value Date   HGBA1C 5.7 (H) 12/02/2020   Urine Drug Screen: No results found for: LABOPIA, COCAINSCRNUR, LABBENZ, AMPHETMU, THCU, LABBARB  Alcohol Level No results found for: [facility]  IMAGING  Results for orders placed or performed during the hospital encounter of 12/01/20  CT HEAD WO CONTRAST (5MM)   Narrative   CLINICAL DATA:  Follow-up after neurovascular intervention.  EXAM: CT HEAD WITHOUT CONTRAST  TECHNIQUE: Contiguous axial images were obtained  from the base of the skull through the vertex without intravenous contrast.  COMPARISON:  12/01/2020  FINDINGS: Brain: Allowing for redistribution, unchanged subarachnoid hyperdensity over both hemispheres, left-greater-than-right. A suspected intraparenchymal component in the left parietal lobe is unchanged. There is periventricular hypoattenuation compatible with chronic microvascular disease. Generalized volume loss. No hydrocephalus.  Vascular: No abnormal hyperdensity of the major intracranial arteries or dural venous sinuses. No intracranial atherosclerosis.  Skull: The visualized skull base, calvarium and extracranial soft tissues are normal.  Sinuses/Orbits: No fluid levels or advanced mucosal thickening of the visualized paranasal sinuses. No mastoid or middle ear effusion. The orbits are normal.  IMPRESSION: 1. Unchanged subarachnoid hyperdensity over both hemispheres, left-greater-than-right, likely a combination contrast extravasation and subarachnoid blood. 2. Unchanged subcortical intraparenchymal blood in the left parietal lobe.   Electronically Signed   By: Ulyses Jarred M.D.   On: 12/02/2020 03:36   Results for orders placed or performed during the hospital encounter of 06/24/20  MR BRAIN WO CONTRAST   Narrative  CLINICAL DATA:  Neuro deficit, acute stroke suspected  EXAM: MRI HEAD WITHOUT CONTRAST  MRA HEAD WITHOUT CONTRAST  TECHNIQUE: Multiplanar, multi-echo pulse sequences of the brain and surrounding structures were acquired without intravenous contrast. Angiographic images of the Circle of Willis were acquired using MRA technique without intravenous contrast.  COMPARISON:  And a CT code stroke. MRI head June 23, 2019. CTA June 30 2019.  FINDINGS: MRI HEAD FINDINGS  Brain: Punctate focus of restricted diffusion in the high left frontal cortex (series 3, image 38). No significant edema or mass effect. Additional punctate areas of faint  DWI signal in bilateral precentral gyri (series 3, images 37 and 39). Redemonstrated multiple remote cortical infarcts in bilateral frontal parietal and occipital lobes. Multiple bilateral cerebellar lacunar infarcts. Moderate scattered patchy confluent T2/FLAIR hyperintensities within the white matter most likely related to chronic microvascular ischemic disease. No hydrocephalus. Age advanced moderate cerebral atrophy with ex vacuo ventricular dilation. No acute hemorrhage. No mass lesion or abnormal mass effect. No extra-axial fluid collection.  Vascular: See below.  Skull and upper cervical spine: Normal marrow signal.  Sinuses/Orbits: Clear sinuses.  Unremarkable orbits.  Other: No sizable mastoid effusions.  MRA HEAD FINDINGS  Anterior circulation: Bilateral ICAs, MCAs, and ACAs are patent without flow-limiting proximal stenosis. No aneurysm identified.  Posterior circulation: The visualized intradural vertebral arteries, basilar artery and posterior cerebral arteries are patent without flow limiting proximal stenosis. No aneurysm identified.  IMPRESSION: MRI:  1. Punctate acute infarct in the high left frontal cortex. Additional faint punctate areas of DWI signal in bilateral precentral gyri are suspicious for additional acute or subacute infarcts, but could potentially represent artifact. Given potential involvement of multiple vascular territories, consider a central embolic etiology. 2. 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 3. Moderate atrophy.  MRA:  No large vessel occlusion or proximal flow limiting stenosis.   Electronically Signed   By: Margaretha Sheffield MD   On: 06/24/2020 20:15   MR ANGIO HEAD WO CONTRAST   Narrative   CLINICAL DATA:  Neuro deficit, acute stroke suspected  EXAM: MRI HEAD WITHOUT CONTRAST  MRA HEAD WITHOUT  CONTRAST  TECHNIQUE: Multiplanar, multi-echo pulse sequences of the brain and surrounding structures were acquired without intravenous contrast. Angiographic images of the Circle of Willis were acquired using MRA technique without intravenous contrast.  COMPARISON:  And a CT code stroke. MRI head June 23, 2019. CTA June 30 2019.  FINDINGS: MRI HEAD FINDINGS  Brain: Punctate focus of restricted diffusion in the high left frontal cortex (series 3, image 38). No significant edema or mass effect. Additional punctate areas of faint DWI signal in bilateral precentral gyri (series 3, images 37 and 39). Redemonstrated multiple remote cortical infarcts in bilateral frontal parietal and occipital lobes. Multiple bilateral cerebellar lacunar infarcts. Moderate scattered patchy confluent T2/FLAIR hyperintensities within the white matter most likely related to chronic microvascular ischemic disease. No hydrocephalus. Age advanced moderate cerebral atrophy with ex vacuo ventricular dilation. No acute hemorrhage. No mass lesion or abnormal mass effect. No extra-axial fluid collection.  Vascular: See below.  Skull and upper cervical spine: Normal marrow signal.  Sinuses/Orbits: Clear sinuses.  Unremarkable orbits.  Other: No sizable mastoid effusions.  MRA HEAD FINDINGS  Anterior circulation: Bilateral ICAs, MCAs, and ACAs are patent without flow-limiting proximal stenosis. No aneurysm identified.  Posterior circulation: The visualized intradural vertebral arteries, basilar artery and posterior cerebral arteries are patent without flow limiting proximal stenosis.  No aneurysm identified.  IMPRESSION: MRI:  1. Punctate acute infarct in the high left frontal cortex. Additional faint punctate areas of DWI signal in bilateral precentral gyri are suspicious for additional acute or subacute infarcts, but could potentially represent artifact. Given potential involvement of multiple  vascular territories, consider a central embolic etiology. 2. 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 3. Moderate atrophy.  MRA:  No large vessel occlusion or proximal flow limiting stenosis.   Electronically Signed   By: Margaretha Sheffield MD   On: 06/24/2020 20:15   Results for orders placed or performed during the hospital encounter of 06/23/19  MR BRAIN W WO CONTRAST   Narrative   [facility]  NEUROIMAGING REPORT   STUDY DATE: 06/23/19 PATIENT NAME: Jacqueline Bonilla DOB: April 07, 1955 MRN: [id number]  ORDERING CLINICIAN: [name], MD  CLINICAL HISTORY: 65 year old female with numbness.  EXAM: MR BRAIN W WO CONTRAST  TECHNIQUE: MRI of the brain with and without contrast was obtained  utilizing 5 mm axial slices with T1, T2, T2 flair, SWI and diffusion  weighted views.  T1 sagittal, T2 coronal and postcontrast views in the  axial and coronal plane were obtained. CONTRAST: 34ml multihance  COMPARISON: none  IMAGING SITE: [facility] [address] (1.5 Tesla MRI)    FINDINGS:   Scattered bilateral periventricular, subcortical and juxtacortical DWI  hyperintensities in the bilateral cerebral hemispheres.  Some of these are  hypointense on ADC maps.  Moderate periventricular and subcortical and  cortical foci of T2FLAIR hyperintensities, consistent with chronic  ischemic disease.   No abnormal lesions are seen on diffusion-weighted views to suggest acute  ischemia. The cortical sulci, fissures and cisterns are normal in size and  appearance. Lateral, third and fourth ventricle are normal in size and  appearance. No extra-axial fluid collections are seen. No evidence of mass  effect or midline shift.  No abnormal lesions are seen on post contrast  views.    On sagittal views the posterior fossa, pituitary gland and corpus callosum   are unremarkable. No evidence of intracranial hemorrhage on SWI views. The  orbits and their contents, paranasal sinuses and calvarium are  unremarkable.  Intracranial flow voids are present.     Impression   MRI brain (with and without) demonstrating: -Scattered acute, subacute and chronic ischemic infarcts in the bilateral  cerebral hemispheres. Pattern suggests a proximal / cardioembolic source.  -Moderate chronic small vessel ischemic disease.      INTERPRETING PHYSICIAN:  Penni Bombard, MD Certified in Neurology, Neurophysiology and Neuroimaging  [facility] [address], [address], [place] [address] [phone]        PHYSICAL EXAM  Temp:  [98.5 F (36.9 C)-101.6 F (38.7 C)] 99.9 F (37.7 C) (12/03 0800) Pulse Rate:  [69-127] 83 (12/03 0800) Resp:  [11-22] 12 (12/03 0800) BP: (102-139)/(64-87) 123/68 (12/03 0800) SpO2:  [94 %-100 %] 99 % (12/03 0800) Arterial Line BP: (125-152)/(54-64) 152/64 (12/02 1200)  Mental Status -  Patient is alert, oriented to person and place Able to name objects-- hypophonic and mildly dysarthric Improving receptive aphasia. Followed most commands.    Cranial Nerves II - XII - II - PERRL. Blinks to threat consistently consistently III, IV, VI - PERRLA, difficulty looking towards the right V - Facial sensation intact bilaterally. VII - Right nasolabial fold flattening. VIII - Hearing & vestibular intact bilaterally. X -  Palate elevates symmetrically. XI - Head is midline XII - Tongue protrusion intact.   Motor Strength - Bulk was normal and fasciculations were absent.  Spontaneous movement in all extremities.    Motor Tone - Increased muscle tone in the upper right extremity   Sensory - not cooperative but could   Coordination - Gross motor intact, ataxia noted in right arm with FTN   Gait and Station - deferred.    ASSESSMENT/PLAN Jacqueline Bonilla is a 65 y.o. female with  history of anemia, anxiety, depression, two prior strokes, HTN, loop recorder implant in 10/2019, osteoporosis, and thrombocytopenia presenting with acute onset of aphasia and worsened right sided incoordination relative to her baseline deficit. On arrival to the ED the patient was with dense receptive aphasia and mild extensor posturing of her RUE with mild RUE and RLE weakness. Etiology for patient stroke unclear, still consistent with embolic source.  However loop recorder so far negative for A. fib.  Once stable, may consider further cardioembolic work-up with TEE, may consider empiric anticoagulation treatment.  Currently patient has small SAH from procedure, will start aspirin 81 at this time.  Continue home Lipitor 40.  Patient failed swallow, will put on core track and start tube feeding n.p.o. meds.  Given seizure activity overnight, and EEG left spikes, will continue Keppra 500 twice daily.  BP goal less than 160 given SAH postprocedure.    Stroke:  left distal M2/M3 occlusion s/p TNK and IR, complicated by small SAH with TNK reversal with TXA, etiology unclear cryptogenic vs cardioembolic CT head - Unchanged subarachnoid hyperdensity over both hemispheres, left-greater-than-right, likely a combination contrast extravasation and subarachnoid blood.  Unchanged subcortical intraparenchymal blood in the left parietal lobe. MRI head Scattered small acute infarcts in the left MCA distribution as above. Subarachnoid hemorrhage overlying the bilateral cerebral hemispheres, not significantly changed compared to the prior CT allowing for difference in modality. The small focus of intraparenchymal hemorrhage in the left parietal lobe is also unchanged.  MRA head  Interval recanalization of the previously occluded left inferior M2 division. No high-grade stenosis or occlusion on the current study. IR - Successful mechanical thrombectomy, small left sylvian subarachnoid hemorrhage vs contrast extravasation on  postprocedural flat panel head ct 2D Echo  Pending EEG-  evidence of cortical dysfunction and an epileptogenicity arising from left frontotemporal region, likely secondary to underlying structural abnormality.  No seizures  were seen throughout the recording. October 2021 Loop Recorder- negative for Afib Venous Duplex- Pending LDL 89 HgbA1c 5.7 SCDs for VTE prophylaxis aspirin 81 mg daily prior to admission, now on aspirin 325 mg daily. Hold plavix due to [facility] Patient counseled to be compliant with her antithrombotic medications Ongoing aggressive stroke risk factor management Therapy recommendations:  Pending Disposition:  Pending  Previous Stroke- June 2022 Cryptogenic stroke with loop recorder placement MRI/MRA brain done revealing punctate infarct and high "report except faint areas of DWI signal in bilateral precentral gyri question acute/subacute infarct or artifact.  Stroke felt to be embolic recorder was interrogated with negative for A. fib  Possible Seizure activity Left facial twitching with altered mental status Ativan overnight EEG done 12/02/2020 showed left spikes Loaded with keppra and continue keppra BID  Hypertension Stable  BP goal < 160 given SAH post procedure  Long-term BP goal normotensive Home medications losartan Cleviprex d/c, managed well with home medications and prns  Hyperlipidemia Home meds:  None LDL 89, goal < 70 Start Lipitor 40mg  Continue statin at discharge  Diabetes type II HgbA1c 5.7, goal < 7.0 Controlled  Dysphagia SLP consult complete Recommend regular diet with thin liquids Meds whole with liquid Turn off tube feeds and monitor oral intake- d/c cortrak  Other Stroke Risk Factors Advanced age  Other Active Problems Vascular Dementia Altered Mental Status Frequent reorientation Sleep hygiene   Hospital day # 2  Patient seen and examined by NP/APP with MD. MD to update note as needed.   Janine Ores, DNP, FNP-BC [facility] Pager: [phone]  ATTENDING ATTESTATION:   Doing well. Mild residual receptive aphasia. Advancing diet today. 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 Loop recorder neg. Consider empiric anticoagulation next week.   Dr. Reeves Forth evaluated pt independently, reviewed imaging, chart, labs. Discussed and formulated plan with the APP. Please see APP note above for details.   Total 30 minutes spent on counseling patient and coordinating care, writing notes and reviewing chart.     Georganne Siple,MD     To contact Stroke Continuity provider, please refer to [email or web address] After hours, contact General Neurology

## 2020-12-03 NOTE — Progress Notes (Signed)
Lower extremity venous bilateral study completed.   Please see CV Proc for preliminary results.   Yury Schaus, RDMS, RVT  

## 2020-12-03 NOTE — Evaluation (Signed)
Clinical/Bedside Swallow Evaluation Patient Details  Name: CHARLEN BAKULA MRN: [id number] Date of Birth: Nov 05, 1955  Today's Date: 12/03/2020 Time: SLP Start Time (ACUTE ONLY): 0850 SLP Stop Time (ACUTE ONLY): 0900 SLP Time Calculation (min) (ACUTE ONLY): 10 min  Past Medical History:  Past Medical History:  Diagnosis Date   Anemia    Anxiety    Depression    Dysmenorrhea    Endometriosis    Fibroid    Hypertension    Microhematuria    negative workup   Osteoporosis    Tachycardia    Thrombocytopenia (HCC)    Past Surgical History:  Past Surgical History:  Procedure Laterality Date   BREAST BIOPSY     CESAREAN SECTION     hysteroscopic resection     implantable loop recorder implant  10/21/2019   Medtronic Reveal Linq model LNQ 22 ([place] [id number]) implantable loop recorder   IR CT HEAD LTD  12/01/2020   IR PERCUTANEOUS ART THROMBECTOMY/INFUSION INTRACRANIAL INC DIAG ANGIO  12/01/2020   IR US GUIDE VASC ACCESS RIGHT  12/01/2020   RADIOLOGY WITH ANESTHESIA N/A 12/01/2020   Procedure: IR WITH ANESTHESIA - CODE STROKE;  Surgeon: Radiologist, Medication, MD;  Location: [place];  Service: Radiology;  Laterality: N/A;   HPI:  SHAQUIRA MOROZ is an 65 y.o. 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 On arrival to the ED the patient was with dense receptive aphasia and mild extensor posturing of her RUE with mild RUE and RLE weakness.   MRI showing scattered small acute infarcts in the left MCA distrubution, subarachnoid hemorrhage overlying the bilateral hemispheres not significantly  changed compared to CT and hermorrhage in the left parietal that is also unchanged from CT.  Chest xray showing cardiomediastinal silhouette is mildly increased in prominence along the aortic arch, most likely due to a combination of rotation and AP technique. If concern for aortic pathology, recommend  dedicated PA and lateral chest radiograph. 2. Otherwise no acute cardiopulmonary abnormality.  She has been seen for outpatient speech therapy in March and July of this year to address cognitive deficits.    Assessment / Plan / Recommendation  Clinical Impression  Clinical swallowing evaluation was completed using thin liquids via spoon, cup and straw, pureed material and dry solids in setting of new stroke.  Cranial nerve exam was completed and remarkable for mild droop on the right side of her lower face.  Lingual and jaw range of motion and strength appeared to be adequate.  She did not endorse a difference in sensation from the left to right side of her face but she was unable to fully participate in testing for sensation due to her aphasia.  Her oral and pharyngael swallow appeared to be functional.  Mastication of dry solids was adequate with good oral clearance of material.  Swallow trigger was appreciated to palpation and overt s/s of aspiration were not seen even given serial sips of thin liquids via straw.  Suggest beginning a regular diet with thin liquids.  ST will follow briefly for therapeutic diet tolerance in setting of new stroke. SLP Visit Diagnosis: Dysphagia, unspecified (R13.10)  Aspiration Risk  Mild aspiration risk    Diet Recommendation   Regular texture with thin liquids.  Medication Administration: Whole meds with liquid    Other  Recommendations Oral Care Recommendations: Oral care BID    Recommendations for follow up therapy are one component of a multi-disciplinary discharge planning process, led by the attending physician.  Recommendations may be updated based on  patient status, additional functional criteria and insurance authorization.  Follow up Recommendations  (Intense post acute rehab)      Assistance Recommended at Discharge Frequent or constant Supervision/Assistance  Functional Status Assessment Patient has had a recent decline in their functional status and demonstrates the ability to make significant improvements in function in a reasonable and predictable amount of time.  Frequency and Duration min 2x/week  2 weeks       Prognosis Barriers to Reach Goals: Language deficits      Swallow Study   General Date of Onset: 12/01/20 HPI: SELITA STAIGER is an 65 y.o. 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 On arrival to the ED the patient was with dense receptive aphasia and mild extensor posturing of her RUE with mild RUE and RLE weakness.   MRI showing scattered small acute infarcts in the left MCA distrubution, subarachnoid hemorrhage overlying the bilateral hemispheres not significantly changed compared to CT and hermorrhage in the left parietal that is also unchanged from CT.  Chest xray showing cardiomediastinal silhouette is mildly increased in prominence along the aortic arch, most likely due to a combination of rotation and AP technique. If concern for aortic pathology, recommend  dedicated PA and lateral chest radiograph. 2. Otherwise no acute cardiopulmonary abnormality.  She has been seen for outpatient speech therapy in March and July of this year to address cognitive deficits. Type of Study: Bedside Swallow Evaluation Previous  Swallow Assessment: None noted at [facility] Diet Prior to this Study: NG Tube Temperature Spikes Noted: Yes Respiratory Status: Room air History of Recent Intubation: No Behavior/Cognition: Alert;Cooperative;Requires cueing Oral Cavity Assessment: Dry;Dried secretions Oral Care Completed by SLP: No Oral Cavity - Dentition: Adequate natural dentition Vision: Functional for self-feeding Self-Feeding Abilities: Needs assist Patient Positioning: Upright in bed Baseline Vocal Quality: Low vocal intensity Volitional Cough: Cognitively unable to elicit Volitional Swallow: Able to elicit    Oral/Motor/Sensory Function Overall Oral Motor/Sensory Function: Mild impairment Facial ROM: Reduced right Facial Symmetry: Abnormal symmetry right Facial Strength: Reduced right Lingual ROM: Within Functional Limits Lingual Symmetry: Within Functional Limits Lingual Strength: Within Functional Limits Mandible: Within Functional Limits   Ice Chips Ice chips: Within functional limits Presentation: Spoon   Thin Liquid Thin Liquid: Within functional limits Presentation: Cup;Spoon;Straw    Nectar Thick Nectar Thick Liquid: Not tested   Honey Thick Honey Thick Liquid: Not tested   Puree Puree: Within functional limits Presentation: Spoon   Solid     Solid: Within functional limits Presentation: Louanne Belton, MA, [facility] SLP [phone]  Lamar Sprinkles 12/03/2020,9:34 AM

## 2020-12-04 LAB — GLUCOSE, CAPILLARY
Glucose-Capillary: 111 mg/dL — ABNORMAL HIGH (ref 70–99)
Glucose-Capillary: 111 mg/dL — ABNORMAL HIGH (ref 70–99)
Glucose-Capillary: 112 mg/dL — ABNORMAL HIGH (ref 70–99)
Glucose-Capillary: 124 mg/dL — ABNORMAL HIGH (ref 70–99)
Glucose-Capillary: 133 mg/dL — ABNORMAL HIGH (ref 70–99)
Glucose-Capillary: 97 mg/dL (ref 70–99)

## 2020-12-04 LAB — CBC
HCT: 31.7 % — ABNORMAL LOW (ref 36.0–46.0)
Hemoglobin: 10.3 g/dL — ABNORMAL LOW (ref 12.0–15.0)
MCH: 29.8 pg (ref 26.0–34.0)
MCHC: 32.5 g/dL (ref 30.0–36.0)
MCV: 91.6 fL (ref 80.0–100.0)
Platelets: 180 10*3/uL (ref 150–400)
RBC: 3.46 MIL/uL — ABNORMAL LOW (ref 3.87–5.11)
RDW: 12.8 % (ref 11.5–15.5)
WBC: 6 10*3/uL (ref 4.0–10.5)
nRBC: 0 % (ref 0.0–0.2)

## 2020-12-04 LAB — BASIC METABOLIC PANEL
Anion gap: 9 (ref 5–15)
BUN: 19 mg/dL (ref 8–23)
CO2: 24 mmol/L (ref 22–32)
Calcium: 8.6 mg/dL — ABNORMAL LOW (ref 8.9–10.3)
Chloride: 100 mmol/L (ref 98–111)
Creatinine, Ser: 0.75 mg/dL (ref 0.44–1.00)
GFR, Estimated: >60 mL/min (ref >60)
Glucose, Bld: 114 mg/dL — ABNORMAL HIGH (ref 70–99)
Potassium: 3.8 mmol/L (ref 3.5–5.1)
Sodium: 133 mmol/L — ABNORMAL LOW (ref 135–145)

## 2020-12-04 MED ORDER — PANTOPRAZOLE SODIUM 40 MG PO TBEC
40.0000 mg | DELAYED_RELEASE_TABLET | Freq: Every day | ORAL | Status: DC
  Administered 2020-12-04 – 2020-12-07 (×4): 40 mg via ORAL
  Filled 2020-12-04 (×5): qty 1

## 2020-12-04 MED ORDER — ASPIRIN 81 MG PO CHEW
81.0000 mg | CHEWABLE_TABLET | Freq: Every day | ORAL | Status: DC
  Administered 2020-12-04 – 2020-12-07 (×4): 81 mg via ORAL
  Filled 2020-12-04 (×5): qty 1

## 2020-12-04 MED ORDER — ATORVASTATIN CALCIUM 40 MG PO TABS
40.0000 mg | ORAL_TABLET | Freq: Every day | ORAL | Status: DC
  Administered 2020-12-06: 40 mg via ORAL
  Filled 2020-12-04 (×4): qty 1

## 2020-12-04 MED ORDER — THIAMINE HCL 100 MG PO TABS
100.0000 mg | ORAL_TABLET | Freq: Every day | ORAL | Status: DC
  Administered 2020-12-04 – 2020-12-07 (×4): 100 mg via ORAL
  Filled 2020-12-04 (×5): qty 1

## 2020-12-04 NOTE — PMR Pre-admission (Signed)
PMR Admission Coordinator Pre-Admission Assessment  Patient: Jacqueline Bonilla is an 65 y.o., female MRN: [id number] DOB: 01-26-1955 Height:   Weight: 54.4 kg  Insurance Information HMO: yes    PPO:      PCP:      IPA:      80/20:      OTHER:  PRIMARY: [employer]      Policy#: [id number]      Subscriber: patient CM Name: Fraser Din      Phone#: [phone]     Fax#: [phone] Pre-Cert#: [id number] Received insurance authorization from [place] on 12/05/20. Updates are due weekly.     Employer:  Benefits:  Phone #: n/a-online at [email or web address]     Name:  Eff. Date: 01/02/20-12/31/20   Deduct: does not  have one   Out of Pocket Max: $3,900 ($2,940 met)      Life Max: NA CIR: $295/day co-pay with max co-pay of $1,770/admisison      SNF: 100% coverage for days 1-20, $178/day co-pay for days 21-100 Outpatient: $10-$20/visit co-pay     Co-Pay:  Home Health: 100% coverage      Co-Pay:  DME: 80% coverage     Co-Pay: 20% co-insurance Providers: in-network SECONDARY:       Policy#:      Phone#:   Development worker, community:       Phone#:   The Engineer, petroleum" for patients in Inpatient Rehabilitation Facilities with attached "Privacy Act [place] Records" was provided and verbally reviewed with: Patient and Family  Emergency Contact Information Contact Information     Name Relation Home Work Mobile   Hemmelgarn,[name] Spouse   [phone]      Current Medical History  Patient Admitting Diagnosis: stroke  History of Present Illness: Pt is a 65 year old female with medical hx significant for: anemia, anxiety, depression, two prior strokes in the past 14 months, HTN, loop recorder implant (10/2019), osteoporosis, thrombocytopenia. Pt presented to hospital on 12/01/20 as a Code Stroke. Pt had acute onset of aphasia and worsened right-sided incoordination. CT head negative for acute abnormality. 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 Pt received TNK. Pt had mechanical thrombectomy performed on 12/1. SAH noted in left sylvian fissure on post procedural CT. Repeat CT showed worsening of left SAH post IR procedure. Pt had TNK reversal and was given TXA. Another repeat CT showed stable SAH. Pt noted to have seizure like activity on 12/02/20. MRI showed small scattered left MCA infarcts with small amount of SAH. MRA showed recanalization of left M2. EEG revealed evidence of cortical dysfunction and an epileptogenicity arising from left frontotemporal region. left spikes but no seizure. Posey belt placed d/t agitation. Cortrak placed on 12/2. Diet was able to be advanced to regular/thin liquids when pt passed swallow function assessment. Therapy evaluations completed and CIR recommended d/t pt's deficits in functional mobility and ability to complete ADLs independently. TEE 12/8 with no source of embolus.  Complete NIHSS TOTAL: 4  Patient's medical record from [facility] has been reviewed by the rehabilitation admission coordinator and physician.  Past Medical History  Past Medical History:  Diagnosis Date   Anemia    Anxiety    Depression    Dysmenorrhea    Endometriosis    Fibroid    Hypertension    Microhematuria    negative workup   Osteoporosis    Tachycardia    Thrombocytopenia ([place])  Has the patient had major surgery during 100 days prior to admission? Yes  Family History   family history includes Cancer in her father; Dementia in her mother; Heart attack in her maternal grandfather; Polymyositis in her sister.  Current Medications  Current Facility-Administered Medications:    [MAR Hold]  stroke: mapping our early stages of recovery book, , Does not apply, Once, Kerney Elbe, MD   0.9 %  sodium chloride infusion, , Intravenous, Continuous, [place], [place], [place], [place] at 12/08/20 0946   [MAR Hold] acetaminophen (TYLENOL) tablet 650 mg, 650 mg, Oral, Q4H PRN  **OR** [MAR Hold] acetaminophen (TYLENOL) 160 MG/5ML solution 650 mg, 650 mg, Per Tube, Q4H PRN, 650 mg at 12/02/20 2357 **OR** [MAR Hold] acetaminophen (TYLENOL) suppository 650 mg, 650 mg, Rectal, Q4H PRN, de Sindy Messing, [place], MD   [facility] Hold] aspirin chewable tablet 81 mg, 81 mg, Oral, Daily, Palikh, Gaurang M, MD, 81 mg at 12/07/20 1030   [MAR Hold] atorvastatin (LIPITOR) tablet 40 mg, 40 mg, Oral, Daily, Palikh, Gaurang M, MD, 40 mg at 12/06/20 0953   [MAR Hold] bethanechol (URECHOLINE) tablet 5 mg, 5 mg, Oral, TID, Dorene Grebe, MD, 5 mg at 12/07/20 2146   [facility] Hold] Chlorhexidine Gluconate Cloth 2 % PADS 6 each, 6 each, Topical, Daily, Kerney Elbe, MD, 6 each at 12/07/20 1035   [MAR Hold] cholecalciferol (VITAMIN D3) tablet 1,000 Units, 1,000 Units, Oral, Daily, Kerney Elbe, MD, 1,000 Units at 12/07/20 1030   [MAR Hold] feeding supplement (ENSURE ENLIVE / ENSURE PLUS) liquid 237 mL, 237 mL, Oral, BID BM, Leonie Man, Pramod S, MD, 237 mL at 12/07/20 1530   [MAR Hold] haloperidol lactate (HALDOL) injection 5 mg, 5 mg, Intravenous, Q6H PRN, Rosalin Hawking, MD, 5 mg at 12/03/20 2331   [facility] Hold] labetalol (NORMODYNE) injection 5-20 mg, 5-20 mg, Intravenous, Q10 min PRN, Rosalin Hawking, MD   [MAR Hold] levETIRAcetam (KEPPRA) tablet 500 mg, 500 mg, Oral, BID, Pham, Minh Q, RPH-CPP, 500 mg at 12/07/20 2146   [facility] Hold] losartan (COZAAR) tablet 25 mg, 25 mg, Oral, Daily, Palikh, Gaurang M, MD, 25 mg at 12/07/20 1030   [MAR Hold] multivitamin with minerals tablet 1 tablet, 1 tablet, Oral, Daily, Garvin Fila, MD, 1 tablet at 12/07/20 1700   [MAR Hold] ondansetron (ZOFRAN) injection 4 mg, 4 mg, Intravenous, Q6H PRN, de Sindy Messing, [place], MD   [facility] Hold] pantoprazole (PROTONIX) EC tablet 40 mg, 40 mg, Oral, Daily, Ardyth Harps B, RPH, 40 mg at 12/07/20 1030   [MAR Hold] senna-docusate (Senokot-S) tablet 1 tablet, 1 tablet, Oral, QHS PRN, Kerney Elbe, MD   [MAR Hold] tamsulosin  [facility]) capsule 0.4 mg, 0.4 mg, Oral, QPC supper, Palikh, Gaurang M, MD, 0.4 mg at 12/07/20 1859   [MAR Hold] thiamine tablet 100 mg, 100 mg, Oral, Daily, Palikh, Gaurang M, MD, 100 mg at 12/07/20 1030   [MAR Hold] ticagrelor (BRILINTA) tablet 90 mg, 90 mg, Oral, BID, Garvin Fila, MD, 90 mg at 12/07/20 2146  Patients Current Diet:  Diet Order             Diet NPO time specified Except for: Sips with Meds  Diet effective midnight                 NPO for TEE. On regular diet with thin liquids  Precautions / Restrictions Precautions Precautions: Fall, Other (comment) Precaution Comments: apraxic, ataxic Restrictions Weight Bearing Restrictions: No   Has the patient had 2 or more falls  or a fall with injury in the past year? No  Prior Activity Level Community (5-7x/wk): gets out house ~5 days/week  Prior Functional Level Self Care: Did the patient need help bathing, dressing, using the toilet or eating? Independent  Indoor Mobility: Did the patient need assistance with walking from room to room (with or without device)? Independent  Stairs: Did the patient need assistance with internal or external stairs (with or without device)? Independent  Functional Cognition: Did the patient need help planning regular tasks such as shopping or remembering to take medications? Independent  Patient Information Are you of Hispanic, Latino/a,or Spanish origin?: A. No, not of Hispanic, Latino/a, or Spanish origin What is your race?: A. White Do you need or want an interpreter to communicate with a doctor or health care staff?: 0. No  Patient's Response To:  Health Literacy and Transportation Is the patient able to respond to health literacy and transportation needs?: Yes Health Literacy - How often do you need to have someone help you when you read instructions, pamphlets, or other written material from your doctor or pharmacy?: Never In the past 12 months, has lack of transportation  kept you from medical appointments or from getting medications?: No In the past 12 months, has lack of transportation kept you from meetings, work, or from getting things needed for daily living?: No  Home Assistive Devices / Equipment Home Equipment: Grab bars - tub/shower, Grab bars - toilet, Cane - single point  Prior Device Use: Indicate devices/aids used by the patient prior to current illness, exacerbation or injury? None of the above  Current Functional Level Cognition  Overall Cognitive Status: Impaired/Different from baseline Current Attention Level: Selective Orientation Level: Oriented X4 Following Commands: Follows one step commands consistently Safety/Judgement: Decreased awareness of safety, Decreased awareness of deficits General Comments: Follows one step commands well with increased time, needs reminders and further cues throughout session    Extremity Assessment (includes Sensation/Coordination)  Upper Extremity Assessment: Defer to OT evaluation RUE Deficits / Details: AROM overall WFL; apparent motor sensory processing deficits; "alien arm" features; using functionally for grooming with incrsaed effort RUE Coordination: decreased fine motor, decreased gross motor  Lower Extremity Assessment: Defer to PT evaluation RLE Deficits / Details: apraxic, AROM WFL RLE Coordination: decreased fine motor, decreased gross motor    ADLs  Overall ADL's : Needs assistance/impaired Eating/Feeding: Moderate assistance Eating/Feeding Details (indicate cue type and reason): will bring food to her mouth  "without having anything in her hand" per husband Grooming: Min guard, Standing, Sitting Grooming Details (indicate cue type and reason): brushing her teeth without a toothbrush in her hand Lower Body Bathing: Moderate assistance Upper Body Dressing : Moderate assistance Lower Body Dressing: Maximal assistance Toilet Transfer: Minimal assistance, Ambulation, Rolling walker (2  wheels) Toilet Transfer Details (indicate cue type and reason): min A for verbal cues and safety Toileting- Clothing Manipulation and Hygiene: Maximal assistance Functional mobility during ADLs: Minimal assistance, Rolling walker (2 wheels) General ADL Comments: min A for verbal cues, pt walking into obstacles and drifting towards the L, not attending to task and needs assist for RUE    Mobility  Overal bed mobility: Needs Assistance Bed Mobility: Sit to Supine, Supine to Sit Supine to sit: Min guard Sit to supine: Min assist General bed mobility comments: Pt with improved initiation today and able to scoot hips to EOB without physical assist, minA for sit>supine for BLE management    Transfers  Overall transfer level: Needs assistance Equipment used: Rolling  walker (2 wheels) Transfers: Sit to/from Stand Sit to Stand: Min assist Bed to/from chair/wheelchair/BSC transfer type:: Stand pivot Stand pivot transfers: Mod assist General transfer comment: Cues needed for safe hand placement, scooting hips forward, and nose over toes    Ambulation / Gait / Stairs / Wheelchair Mobility  Ambulation/Gait Ambulation/Gait assistance: Herbalist (Feet): 50 Feet Assistive device: Rolling walker (2 wheels) Gait Pattern/deviations: Step-through pattern, Decreased stride length, Shuffle, Drifts right/left General Gait Details: Pt driting to R an unaware, multimodal cues to walk in center of hallway and not drift to R, pt also needed many cues for walker proximity and centering inside walker as she would often turn her body to the R inside the walker Gait velocity: decreased Gait velocity interpretation: <1.31 ft/sec, indicative of household ambulator    Posture / Balance Balance Overall balance assessment: Needs assistance Sitting-balance support: Feet supported, Single extremity supported Sitting balance-Leahy Scale: Fair Postural control: Right lateral lean Standing balance  support: During functional activity, No upper extremity supported Standing balance-Leahy Scale: Fair Standing balance comment: Reliant on external support    Special needs/care consideration Hgb A1c 5.7   Previous Home Environment  Living Arrangements: Spouse/significant other  Lives With: Spouse Available Help at Discharge: Family, Friend(s), Available 24 hours/day Type of Home: House Home Layout: One level Home Access: Stairs to enter Entrance Stairs-Rails: Left Entrance Stairs-Number of Steps: 3 Bathroom Shower/Tub: Multimedia programmer: Handicapped height Bathroom Accessibility: Yes How Accessible: Accessible via walker [place]: No  Discharge Living Setting Plans for Discharge Living Setting: Patient's home Type of Home at Discharge: House Discharge Home Layout: One level Discharge Home Access: Stairs to enter Entrance Stairs-Rails: Left Entrance Stairs-Number of Steps: 3 Discharge Bathroom Shower/Tub: Walk-in shower Discharge Bathroom Toilet: Handicapped height Discharge Bathroom Accessibility: Yes How Accessible: Accessible via walker Does the patient have any problems obtaining your medications?: No  Social/Family/Support Systems Patient Roles: Spouse Anticipated Caregiver: Jewelia Bocchino, husband Anticipated Caregiver's Contact Information: [phone] Caregiver Availability: 24/7 Discharge Plan Discussed with Primary Caregiver: Yes Is Caregiver In Agreement with Plan?: Yes Does Caregiver/Family have Issues with Lodging/Transportation while Pt is in Rehab?: No  Goals Patient/Family Goal for Rehab: Supervision: PT/OT, Mod I-Supervision: ST Expected length of stay: 14-16 days Pt/Family Agrees to Admission and willing to participate: Yes Program Orientation Provided & Reviewed with Pt/Caregiver Including Roles  & Responsibilities: Yes  Decrease burden of Care through IP rehab admission: NA  Possible need for SNF placement upon discharge:  Not anticipated  Patient Condition: I have reviewed medical records from [facility], spoken with CSW, and patient and spouse. I met with patient at the bedside and discussed via phone for inpatient rehabilitation assessment.  Patient will benefit from ongoing PT, OT, and SLP, can actively participate in 3 hours of therapy a day 5 days of the week, and can make measurable gains during the admission.  Patient will also benefit from the coordinated team approach during an Inpatient Acute Rehabilitation admission.  The patient will receive intensive therapy as well as Rehabilitation physician, nursing, social worker, and care management interventions.  Due to safety, skin/wound care, disease management, medication administration, and patient education the patient requires 24 hour a day rehabilitation nursing.  The patient is currently min assist overall with mobility and basic ADLs.  Discharge setting and therapy post discharge at home with home health is anticipated.  Patient has agreed to participate in the Acute Inpatient Rehabilitation Program and will admit today.  Preadmission Screen  Completed By:  Bethel Born, 12/08/2020 10:47 AM ______________________________________________________________________   Discussed status with Dr. Dagoberto Ligas on 12/08/2020 at 1046 and received approval for admission today.  Admission Coordinator:  Bethel Born, [place], time  1046 Date 12/08/2020   Assessment/Plan: Diagnosis: Does the need for close, 24 hr/day Medical supervision in concert with the patient's rehab needs make it unreasonable for this patient to be served in a less intensive setting? Yes Co-Morbidities requiring supervision/potential complications: prior stroke, L MCA stroke with aphasia, dysphagia, HTN, loop recorder, anxiety/depression Due to bladder management, bowel management, safety, skin/wound care, disease management, medication administration, and patient education, does  the patient require 24 hr/day rehab nursing? Yes Does the patient require coordinated care of a physician, rehab nurse, PT, OT, and SLP to address physical and functional deficits in the context of the above medical diagnosis(es)? Yes Addressing deficits in the following areas: balance, endurance, locomotion, strength, transferring, bowel/bladder control, bathing, dressing, feeding, grooming, toileting, cognition, speech, and language Can the patient actively participate in an intensive therapy program of at least 3 hrs of therapy 5 days a week? Yes The potential for patient to make measurable gains while on inpatient rehab is good Anticipated functional outcomes upon discharge from inpatient rehab: supervision PT, supervision OT, supervision SLP Estimated rehab length of stay to reach the above functional goals is: 14-16 days Anticipated discharge destination: Home 10. Overall Rehab/Functional Prognosis: good   MD Signature:

## 2020-12-04 NOTE — Progress Notes (Addendum)
STROKE TEAM PROGRESS NOTE   SUBJECTIVE (INTERVAL HISTORY) Husband at the bedside. Patient passed swallow screen and diet advanced to regular with thins. Patient is more interactive today and she is ale to communicating appropriately. Patient did have an episode agitation overnight and required a dose of haldol. Hoping this improves with better sleep on the progressive unit. If there are still issues with agitation and sleep consider seroquel. Plan for patient to move out of the ICU today. D/c foley and cortrak. Neuro assessments q4hr  OBJECTIVE Vitals:   12/04/20 1100 12/04/20 1200 12/04/20 1300 12/04/20 1405  BP: 129/80 132/82 128/74 136/81  Pulse: 85 76 99 (!) 102  Resp: 12 12 11 12   Temp:  97.7 F (36.5 C)    TempSrc:  Axillary    SpO2: 96% 99% 96%   Weight:        CBC:  Recent Labs  Lab 12/01/20 1001 12/02/20 0440 12/03/20 0423 12/04/20 0629  WBC 7.2   < > 8.2 6.0  NEUTROABS 5.1  --   --   --   HGB 12.5   < > 11.1* 10.3*  HCT 38.8   < > 34.4* 31.7*  MCV 93.9   < > 93.5 91.6  PLT 191   < > 190 180   < > = values in this interval not displayed.     Basic Metabolic Panel:  Recent Labs  Lab 12/03/20 0423 12/03/20 1634 12/04/20 0629  NA 136  --  133*  K 4.2  --  3.8  CL 103  --  100  CO2 26  --  24  GLUCOSE 121*  --  114*  BUN 20  --  19  CREATININE 0.87  --  0.75  CALCIUM 8.7*  --  8.6*  MG 1.9 1.8  --   PHOS 3.9 3.4  --      Lipid Panel:  Recent Labs  Lab 12/02/20 0440  CHOL 153  TRIG 81  HDL 48  CHOLHDL 3.2  VLDL 16  LDLCALC 89    HgbA1c:  Lab Results  Component Value Date   HGBA1C 5.7 (H) 12/02/2020   Urine Drug Screen: No results found for: LABOPIA, COCAINSCRNUR, LABBENZ, AMPHETMU, THCU, LABBARB  Alcohol Level No results found for: [facility]  IMAGING  Results for orders placed or performed during the hospital encounter of 12/01/20  CT HEAD WO CONTRAST (5MM)   Narrative   CLINICAL DATA:  Follow-up after neurovascular  intervention.  EXAM: CT HEAD WITHOUT CONTRAST  TECHNIQUE: Contiguous axial images were obtained from the base of the skull through the vertex without intravenous contrast.  COMPARISON:  12/01/2020  FINDINGS: Brain: Allowing for redistribution, unchanged subarachnoid hyperdensity over both hemispheres, left-greater-than-right. A suspected intraparenchymal component in the left parietal lobe is unchanged. There is periventricular hypoattenuation compatible with chronic microvascular disease. Generalized volume loss. No hydrocephalus.  Vascular: No abnormal hyperdensity of the major intracranial arteries or dural venous sinuses. No intracranial atherosclerosis.  Skull: The visualized skull base, calvarium and extracranial soft tissues are normal.  Sinuses/Orbits: No fluid levels or advanced mucosal thickening of the visualized paranasal sinuses. No mastoid or middle ear effusion. The orbits are normal.  IMPRESSION: 1. Unchanged subarachnoid hyperdensity over both hemispheres, left-greater-than-right, likely a combination contrast extravasation and subarachnoid blood. 2. Unchanged subcortical intraparenchymal blood in the left parietal lobe.   Electronically Signed   By: Ulyses Jarred M.D.   On: 12/02/2020 03:36   Results for orders placed or performed during the hospital  encounter of 06/24/20  MR BRAIN WO CONTRAST   Narrative   CLINICAL DATA:  Neuro deficit, acute stroke suspected  EXAM: MRI HEAD WITHOUT CONTRAST  MRA HEAD WITHOUT CONTRAST  TECHNIQUE: Multiplanar, multi-echo pulse sequences of the brain and surrounding structures were acquired without intravenous contrast. Angiographic images of the Circle of Willis were acquired using MRA technique without intravenous contrast.  COMPARISON:  And a CT code stroke. MRI head June 23, 2019. CTA June 30 2019.  FINDINGS: MRI HEAD FINDINGS  Brain: Punctate focus of restricted diffusion in the high left frontal  cortex (series 3, image 38). No significant edema or mass effect. Additional punctate areas of faint DWI signal in bilateral precentral gyri (series 3, images 37 and 39). Redemonstrated multiple remote cortical infarcts in bilateral frontal parietal and occipital lobes. Multiple bilateral cerebellar lacunar infarcts. Moderate scattered patchy confluent T2/FLAIR hyperintensities within the white matter most likely related to chronic microvascular ischemic disease. No hydrocephalus. Age advanced moderate cerebral atrophy with ex vacuo ventricular dilation. No acute hemorrhage. No mass lesion or abnormal mass effect. No extra-axial fluid collection.  Vascular: See below.  Skull and upper cervical spine: Normal marrow signal.  Sinuses/Orbits: Clear sinuses.  Unremarkable orbits.  Other: No sizable mastoid effusions.  MRA HEAD FINDINGS  Anterior circulation: Bilateral ICAs, MCAs, and ACAs are patent without flow-limiting proximal stenosis. No aneurysm identified.  Posterior circulation: The visualized intradural vertebral arteries, basilar artery and posterior cerebral arteries are patent without flow limiting proximal stenosis. No aneurysm identified.  IMPRESSION: MRI:  1. Punctate acute infarct in the high left frontal cortex. Additional faint punctate areas of DWI signal in bilateral precentral gyri are suspicious for additional acute or subacute infarcts, but could potentially represent artifact. Given potential involvement of multiple vascular territories, consider a central embolic etiology. 2. 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 3. Moderate atrophy.  MRA:  No large vessel occlusion or proximal flow limiting stenosis.   Electronically Signed   By: Margaretha Sheffield MD   On: 06/24/2020 20:15   MR ANGIO HEAD WO CONTRAST   Narrative   CLINICAL DATA:  Neuro deficit,  acute stroke suspected  EXAM: MRI HEAD WITHOUT CONTRAST  MRA HEAD WITHOUT CONTRAST  TECHNIQUE: Multiplanar, multi-echo pulse sequences of the brain and surrounding structures were acquired without intravenous contrast. Angiographic images of the Circle of Willis were acquired using MRA technique without intravenous contrast.  COMPARISON:  And a CT code stroke. MRI head June 23, 2019. CTA June 30 2019.  FINDINGS: MRI HEAD FINDINGS  Brain: Punctate focus of restricted diffusion in the high left frontal cortex (series 3, image 38). No significant edema or mass effect. Additional punctate areas of faint DWI signal in bilateral precentral gyri (series 3, images 37 and 39). Redemonstrated multiple remote cortical infarcts in bilateral frontal parietal and occipital lobes. Multiple bilateral cerebellar lacunar infarcts. Moderate scattered patchy confluent T2/FLAIR hyperintensities within the white matter most likely related to chronic microvascular ischemic disease. No hydrocephalus. Age advanced moderate cerebral atrophy with ex vacuo ventricular dilation. No acute hemorrhage. No mass lesion or abnormal mass effect. No extra-axial fluid collection.  Vascular: See below.  Skull and upper cervical spine: Normal marrow signal.  Sinuses/Orbits: Clear sinuses.  Unremarkable orbits.  Other: No sizable mastoid effusions.  MRA HEAD FINDINGS  Anterior circulation: Bilateral ICAs, MCAs, and ACAs are patent without flow-limiting proximal stenosis. No aneurysm identified.  Posterior circulation: The visualized intradural vertebral arteries,  basilar artery and posterior cerebral arteries are patent without flow limiting proximal stenosis. No aneurysm identified.  IMPRESSION: MRI:  1. Punctate acute infarct in the high left frontal cortex. Additional faint punctate areas of DWI signal in bilateral precentral gyri are suspicious for additional acute or subacute infarcts, but could  potentially represent artifact. Given potential involvement of multiple vascular territories, consider a central embolic etiology. 2. 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 3. Moderate atrophy.  MRA:  No large vessel occlusion or proximal flow limiting stenosis.   Electronically Signed   By: Margaretha Sheffield MD   On: 06/24/2020 20:15   Results for orders placed or performed during the hospital encounter of 06/23/19  MR BRAIN W WO CONTRAST   Narrative   [facility]  NEUROIMAGING REPORT   STUDY DATE: 06/23/19 PATIENT NAME: KAITLYN FRANKO DOB: 06-07-1955 MRN: [id number]  ORDERING CLINICIAN: [name], MD  CLINICAL HISTORY: 65 year old female with numbness.  EXAM: MR BRAIN W WO CONTRAST  TECHNIQUE: MRI of the brain with and without contrast was obtained  utilizing 5 mm axial slices with T1, T2, T2 flair, SWI and diffusion  weighted views.  T1 sagittal, T2 coronal and postcontrast views in the  axial and coronal plane were obtained. CONTRAST: 24ml multihance  COMPARISON: none  IMAGING SITE: [facility] [address] (1.5 Tesla MRI)    FINDINGS:   Scattered bilateral periventricular, subcortical and juxtacortical DWI  hyperintensities in the bilateral cerebral hemispheres.  Some of these are  hypointense on ADC maps.  Moderate periventricular and subcortical and  cortical foci of T2FLAIR hyperintensities, consistent with chronic  ischemic disease.   No abnormal lesions are seen on diffusion-weighted views to suggest acute  ischemia. The cortical sulci, fissures and cisterns are normal in size and  appearance. Lateral, third and fourth ventricle are normal in size and  appearance. No extra-axial fluid collections are seen. No evidence of mass  effect or midline shift.  No abnormal lesions are seen on post contrast  views.     On sagittal views the posterior fossa, pituitary gland and corpus callosum  are unremarkable. No evidence of intracranial hemorrhage on SWI views. The  orbits and their contents, paranasal sinuses and calvarium are  unremarkable.  Intracranial flow voids are present.     Impression   MRI brain (with and without) demonstrating: -Scattered acute, subacute and chronic ischemic infarcts in the bilateral  cerebral hemispheres. Pattern suggests a proximal / cardioembolic source.  -Moderate chronic small vessel ischemic disease.      INTERPRETING PHYSICIAN:  Penni Bombard, MD Certified in Neurology, Neurophysiology and Neuroimaging  [facility] [address] [place], [place] [address] [phone]        PHYSICAL EXAM  Temp:  [97.1 F (36.2 C)-99.6 F (37.6 C)] 97.7 F (36.5 C) (12/04 1200) Pulse Rate:  [76-107] 102 (12/04 1405) Resp:  [10-26] 12 (12/04 1405) BP: (96-142)/(61-89) 136/81 (12/04 1405) SpO2:  [93 %-100 %] 96 % (12/04 1300) Weight:  [54.4 kg] 54.4 kg (12/04 0208)  General - Well nourished, well developed, in no apparent distress.  Ophthalmologic - fundi not visualized due to noncooperation.  Cardiovascular - Regular rhythm and rate.  Mental Status -  Patient is alert, oriented to person and place Able to name objects--dysarthria has improved. Improving receptive aphasia.  Follows all commands improved from yesterday.  Cranial Nerves II -  XII - II - PERRL. Blinks to threat consistently consistently III, IV, VI - PERRLA, difficulty looking towards the right V - Facial sensation intact bilaterally. VII - Right nasolabial fold flattening. VIII - Hearing & vestibular intact bilaterally. X - Palate elevates symmetrically. XI - Head is midline XII - Tongue protrusion intact.  Motor Strength - Bulk was normal and fasciculations were absent.  Spontaneous movement in all extremities.   Motor Tone - Increased muscle tone  in the upper right extremity  Sensory - not cooperative but could  Coordination - Gross motor intact, ataxia noted in right arm with FTN  Gait and Station - deferred.    ASSESSMENT/PLAN Ms. ALYS DULAK is a 65 y.o. female with history of anemia, anxiety, depression, two prior strokes, HTN, loop recorder implant in 10/2019, osteoporosis, and thrombocytopenia presenting with acute onset of aphasia and worsened right sided incoordination relative to her baseline deficit. On arrival to the ED the patient was with dense receptive aphasia and mild extensor posturing of her RUE with mild RUE and RLE weakness. Etiology for patient stroke unclear, still consistent with embolic source.  However loop recorder so far negative for A. fib.  Once stable, may consider further cardioembolic work-up with TEE, may consider empiric anticoagulation treatment.  Currently patient has small SAH from procedure, will start aspirin 81 at this time.  Continue home Lipitor 40.  Patient failed swallow, will put on core track and start tube feeding n.p.o. meds.  Given seizure activity overnight, and EEG left spikes, will continue Keppra 500 twice daily.  BP goal less than 160 given SAH postprocedure.    Stroke:  left distal M2/M3 occlusion s/p TNK and IR, complicated by small SAH with TNK reversal with TXA, etiology unclear cryptogenic vs cardioembolic CT head - Unchanged subarachnoid hyperdensity over both hemispheres, left-greater-than-right, likely a combination contrast extravasation and subarachnoid blood.  Unchanged subcortical intraparenchymal blood in the left parietal lobe. MRI head Scattered small acute infarcts in the left MCA distribution as above. Subarachnoid hemorrhage overlying the bilateral cerebral hemispheres, not significantly changed compared to the prior CT allowing for difference in modality. The small focus of intraparenchymal hemorrhage in the left parietal lobe is also unchanged.  MRA head   Interval recanalization of the previously occluded left inferior M2 division. No high-grade stenosis or occlusion on the current study. IR - Successful mechanical thrombectomy, small left sylvian subarachnoid hemorrhage vs contrast extravasation on postprocedural flat panel head ct 2D Echo  Pending EEG-  evidence of cortical dysfunction and an epileptogenicity arising from left frontotemporal region, likely secondary to underlying structural abnormality.  No seizures  were seen throughout the recording. October 2021 Loop Recorder- negative for Afib Venous Duplex- Pending LDL 89 HgbA1c 5.7 SCDs for VTE prophylaxis aspirin 81 mg daily prior to admission, now on aspirin 325 mg daily. Hold plavix due to [facility] Patient counseled to be compliant with her antithrombotic medications Ongoing aggressive stroke risk factor management Therapy recommendations:  Pending Disposition:  Pending  Previous Stroke- June 2022 Cryptogenic stroke with loop recorder placement MRI/MRA brain done revealing punctate infarct and high "report except faint areas of DWI signal in bilateral precentral gyri question acute/subacute infarct or artifact.  Stroke felt to be embolic recorder was interrogated with negative for A. fib  Possible Seizure activity Left facial twitching with altered mental status Ativan overnight EEG done 12/02/2020 showed left spikes Loaded with keppra and continue keppra BID  Hypertension Stable  BP goal < 160 given SAH post procedure  Long-term BP goal normotensive Home medications losartan Cleviprex d/c, managed well with home medications and prns  Hyperlipidemia Home meds:  None LDL 89, goal < 70 Start Lipitor 40mg  Patient wants to talk to her husband before starting atorvastatin. Plan to restart tomorrow Continue statin at discharge  Diabetes type II HgbA1c 5.7, goal < 7.0 Controlled  Dysphagia SLP consult complete Recommend regular diet with thin liquids Meds whole with  liquid Turn off tube feeds and monitor oral intake- d/c cortrak  Other Stroke Risk Factors Advanced age  Other Active Problems Vascular Dementia Altered Mental Status Frequent reorientation Sleep hygiene   Hospital day # 3  Patient seen and examined by NP/APP with MD. MD to update note as needed.   Janine Ores, DNP, FNP-BC [facility] Pager: [phone]  ATTENDING ATTESTATION:  This is a patient with left M2 occlusion CVA status post TNKase and thrombectomy.  Postop she had a small left subarachnoid hemorrhage and think a reversed.  She had a loop recorder negative for A. fib she has now had 2 cryptogenic strokes and Dr. Erlinda Hong discussed empiric anticoagulation but will defer until later. She had a seizure and abnormal EEG is on Keppra 500 mg twice daily.  She is on aspirin 81 mg.  She has some hesitancy about Lipitor. Now that she is improving and more stable consider transesophageal echo.  She is now eating.  We will transfer her to a floor bed, discontinue her feeding tube and change to p.o. medications.  PT recommended acute rehab.  Dr. Reeves Forth evaluated pt independently, reviewed imaging, chart, labs. Discussed and formulated plan with the APP. Please see APP note above for details.   Total 30 minutes spent on counseling patient and coordinating care, writing notes and reviewing chart.     Kitt Minardi,MD    To contact Stroke Continuity provider, please refer to [email or web address] After hours, contact General Neurology

## 2020-12-04 NOTE — Evaluation (Signed)
Occupational Therapy Evaluation Patient Details Name: Jacqueline Bonilla MRN: [id number] DOB: 16-Aug-1955 Today's Date: 12/04/2020   History of Present Illness 65 y.o. female presents with L weakness and facial droop. 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  MRI found punctate acute infarct in high L frontal cortex; Additional punctate areas of faint DWI signal in bilateral precentral gyri; Multiple remote cortical infarcts in bilateral frontal, parietal, and occipital lobes and multiple bilateral cerebellar lacunar infarctsPMH includes CVA, vascular dementia, HTN, thrombocytopenia and ILR.   Clinical Impression   PTA pt was independent with mobility and ADL, enjoyed going on hikes, but had not returned to driving since her CVA this past summer. Pt currently requires mod A +2 HHA  (for safety) with limited mobility and up to Max A with ADL tasks due to deficits listed below. Pt demonstrates a significant functional decline and will benefit from intensive rehab at CIR to facilitate safe DC home with supportive husband. Will follow acutely.     Recommendations for follow up therapy are one component of a multi-disciplinary discharge planning process, led by the attending physician.  Recommendations may be updated based on patient status, additional functional criteria and insurance authorization.   Follow Up Recommendations  Acute inpatient rehab (3hours/day)    Assistance Recommended at Discharge Frequent or constant Supervision/Assistance  Functional Status Assessment  Patient has had a recent decline in their functional status and demonstrates the ability to make significant improvements in function in a reasonable and predictable amount of time.  Equipment Recommendations  BSC/3in1    Recommendations for Other Services Rehab consult     Precautions / Restrictions Precautions Precautions: Fall Precaution Comments: apraxic;  coretrack      Mobility Bed Mobility Overal bed mobility: Needs Assistance Bed Mobility: Supine to Sit     Supine to sit: Mod assist     General bed mobility comments: mod vc for problem solving; R lat lean; assit to position B feet on floor    Transfers Overall transfer level: Needs assistance Equipment used: 2 person hand held assist Transfers: Sit to/from Stand;Bed to chair/wheelchair/BSC Sit to Stand: Min assist;+2 safety/equipment           General transfer comment: +2 HHA to safely move to chair; poor positional sense during mobility; R bias with decreased awareness      Balance Overall balance assessment: Needs assistance   Sitting balance-Leahy Scale: Poor   Postural control: Right lateral lean;Posterior lean   Standing balance-Leahy Scale: Poor                             ADL either performed or assessed with clinical judgement   ADL Overall ADL's : Needs assistance/impaired Eating/Feeding: Moderate assistance Eating/Feeding Details (indicate cue type and reason): will bring food to her mouth  "without having anything in her hand" per husband Grooming: Moderate assistance Grooming Details (indicate cue type and reason): brushing her teeth without a toothbrush in her hand     Lower Body Bathing: Moderate assistance   Upper Body Dressing : Moderate assistance   Lower Body Dressing: Maximal assistance   Toilet Transfer: Moderate assistance   Toileting- Clothing Manipulation and Hygiene: Maximal assistance       Functional mobility during ADLs: Moderate assistance;+2 for safety/equipment       Vision Baseline Vision/History: 1 Wears glasses Vision Assessment?: Vision impaired- to be further tested in functional context;Yes Eye Alignment: Within  Functional Limits Ocular Range of Motion: Within Functional Limits Alignment/Gaze Preference: Within Defined Limits - poor visual attention Tracking/Visual Pursuits: Decreased smoothness of  horizontal tracking;Decreased smoothness of vertical tracking Saccades: Additional head turns occurred during testing;Additional eye shifts occurred during testing;Impaired - to be further tested in functional context;Decreased speed of saccadic movement Visual Fields:  (? missing targets on R; will further assess) Depth Perception: Overshoots;Undershoots     Perception Perception Comments: imparied spatial awareness; will further assess   Praxis Praxis Praxis-Other Comments: tried brushing her teeth without a toothbrush in her hand; husband reports she is unalb eot get food to her mouth using a utensils    Pertinent Vitals/Pain Pain Assessment: No/denies pain     Hand Dominance Right   Extremity/Trunk Assessment Upper Extremity Assessment Upper Extremity Assessment: RUE deficits/detail RUE Deficits / Details: AROM overall WFL; apparent motor sensory processing deficits; "alien arm" features; attempts to use funcitonally however requires significant assistance RUE Coordination: decreased fine motor;decreased gross motor   Lower Extremity Assessment Lower Extremity Assessment: Defer to PT evaluation   Cervical / Trunk Assessment Cervical / Trunk Assessment: Other exceptions (R bias) Cervical / Trunk Exceptions: unable to maintain midline postural control EOB or supported in chair   Communication Communication Communication: No difficulties   Cognition Arousal/Alertness: Awake/alert Behavior During Therapy: Flat affect Overall Cognitive Status: Impaired/Different from baseline Area of Impairment: Orientation;Attention;Following commands;Memory;Safety/judgement;Awareness;Problem solving                 Orientation Level: Disoriented to;Time;Situation Current Attention Level: Selective Memory: Decreased recall of precautions;Decreased short-term memory Following Commands: Follows one step commands with increased time Safety/Judgement: Decreased awareness of  safety;Decreased awareness of deficits Awareness: Emergent Problem Solving: Slow processing;Decreased initiation;Difficulty sequencing;Requires verbal cues;Requires tactile cues       General Comments  Husband presetn throughout session; VSS    Exercises     Shoulder Instructions      Home Living Family/patient expects to be discharged to:: Private residence Living Arrangements: Spouse/significant other Available Help at Discharge: Family;Friend(s);Available 24 hours/day Type of Home: House Home Access: Stairs to enter [employer] of Steps: 3 Entrance Stairs-Rails: Left Home Layout: One level     Bathroom Shower/Tub: Occupational psychologist: Handicapped height Bathroom Accessibility: Yes How Accessible: Accessible via walker Home Equipment: Grab bars - tub/shower;Grab bars - toilet;Cane - single point      Lives With: Spouse    Prior Functioning/Environment Prior Level of Function : Independent/Modified Independent               ADLs Comments: Had returned to PLOF since last CVA and enjoys walking on trails; had not returned to driving        OT Problem List: Decreased strength;Decreased activity tolerance;Impaired balance (sitting and/or standing);Impaired vision/perception;Decreased coordination;Decreased cognition;Decreased safety awareness;Decreased knowledge of use of DME or AE;Impaired sensation;Impaired UE functional use      OT Treatment/Interventions: Self-care/ADL training;Therapeutic exercise;Neuromuscular education;DME and/or AE instruction;Therapeutic activities;Cognitive remediation/compensation;Visual/perceptual remediation/compensation;Balance training;Patient/family education    OT Goals(Current goals can be found in the care plan section) Acute Rehab OT Goals Patient Stated Goal: per husband to be more independent after going to rehab OT Goal Formulation: With patient/family Time For Goal Achievement: 12/18/20 Potential to  Achieve Goals: Good  OT Frequency: Min 2X/week   Barriers to D/C:            Co-evaluation PT/OT/SLP Co-Evaluation/Treatment: Yes Reason for Co-Treatment: Complexity of the patient's impairments (multi-system involvement);For patient/therapist safety;To address functional/ADL transfers  OT goals addressed during session: ADL's and self-care      AM-PAC OT "6 Clicks" Daily Activity     Outcome Measure Help from another person eating meals?: A Lot Help from another person taking care of personal grooming?: A Lot Help from another person toileting, which includes using toliet, bedpan, or urinal?: A Lot Help from another person bathing (including washing, rinsing, drying)?: A Lot Help from another person to put on and taking off regular upper body clothing?: A Lot Help from another person to put on and taking off regular lower body clothing?: A Lot 6 Click Score: 12   End of Session Equipment Utilized During Treatment: Gait belt Nurse Communication: Mobility status  Activity Tolerance: Patient tolerated treatment well Patient left: in chair;with call bell/phone within reach;with chair alarm set;with family/visitor present  OT Visit Diagnosis: Unsteadiness on feet (R26.81);Other abnormalities of gait and mobility (R26.89);Muscle weakness (generalized) (M62.81);History of falling (Z91.81);Feeding difficulties (R63.3);Apraxia (R48.2);Other symptoms and signs involving cognitive function;Other symptoms and signs involving the nervous system (R29.898)                Time: 3818-2993 OT Time Calculation (min): 32 min Charges:  OT General Charges $OT Visit: 1 Visit OT Evaluation $OT Eval Moderate Complexity: [place], OT/L   Acute OT Clinical Specialist Acute Rehabilitation Services Pager [phone] Office [phone]   [facility] 12/04/2020, 1:00 PM

## 2020-12-04 NOTE — Progress Notes (Signed)
Inpatient Rehab Admissions:  Inpatient Rehab Consult received.  I met with patient at the bedside for rehabilitation assessment and to discuss goals and expectations of an inpatient rehab admission.  Pt acknowledged understanding of CIR goals and expectations. Pt interested in pursuing CIR. Pt gave permission to contact husband, [name]. Spoke with Herscher on the telephone. He also acknowledged understanding of goals and expectations. He is supportive of pt pursuing CIR. Will continue to follow.  Signed: Gayland Curry, [place], [place] Admissions Coordinator [phone]

## 2020-12-04 NOTE — Progress Notes (Signed)
Inpatient Rehab Admissions Coordinator Note:   Per PT/OT patient was screened for CIR candidacy by Adelaide Pfefferkorn Danford Bad, CCC-SLP. At this time, pt appears to be a potential candidate for CIR. I will place an order for rehab consult for full assessment, per our protocol.  Please contact me any with questions.Gayland Curry, [place], [place] Admissions Coordinator [phone] 12/04/20 2:59 PM

## 2020-12-04 NOTE — Evaluation (Signed)
Physical Therapy Evaluation Patient Details Name: Jacqueline Bonilla MRN: [id number] DOB: 02-Feb-1955 Today's Date: 12/04/2020  History of Present Illness  65 y.o. female presents with L weakness and facial droop. 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  MRI found punctate acute infarct in high L frontal cortex; Additional punctate areas of faint DWI signal in bilateral precentral gyri; Multiple remote cortical infarcts in bilateral frontal, parietal, and occipital lobes and multiple bilateral cerebellar lacunar infarctsPMH includes CVA, vascular dementia, HTN, thrombocytopenia and ILR.   Clinical Impression  Pt admitted with above diagnosis. PTA pt living at home with her husband, mod I/I mobility/ADLs. On eval, she required mod assist bed mobility, +2 min assist transfers, and +2 min/HHA ambulation 5'. She presents with shuffle gait and R bias. L gaze preference with difficulty crossing midline. Able to look R with cues but only sustaining a few seconds. Apraxia noted as well. Pt will benefit from skilled PT to increase their independence and safety with mobility to allow discharge to the venue listed below.          Recommendations for follow up therapy are one component of a multi-disciplinary discharge planning process, led by the attending physician.  Recommendations may be updated based on patient status, additional functional criteria and insurance authorization.  Follow Up Recommendations Acute inpatient rehab (3hours/day)    Assistance Recommended at Discharge Frequent or constant Supervision/Assistance  Functional Status Assessment Patient has had a recent decline in their functional status and demonstrates the ability to make significant improvements in function in a reasonable and predictable amount of time.  Equipment Recommendations  Other (comment) (TBD)    Recommendations for Other Services Rehab consult      Precautions / Restrictions Precautions Precautions: Fall;Other (comment) Precaution Comments: apraxic; coretrack      Mobility  Bed Mobility Overal bed mobility: Needs Assistance Bed Mobility: Supine to Sit     Supine to sit: Mod assist     General bed mobility comments: increased time, cues for sequencing    Transfers Overall transfer level: Needs assistance Equipment used: 2 person hand held assist Transfers: Sit to/from Stand Sit to Stand: Min assist;+2 safety/equipment           General transfer comment: assist to power up and stabilize balance    Ambulation/Gait Ambulation/Gait assistance: +2 physical assistance;Min assist Gait Distance (Feet): 5 Feet Assistive device: 2 person hand held assist Gait Pattern/deviations: Narrow base of support;Shuffle Gait velocity: decreased     General Gait Details: cues for sequencing, assist to maintain balance, R lateral lean, decreased foot clearance bilat  Stairs            Wheelchair Mobility    Modified Rankin (Stroke Patients Only) Modified Rankin (Stroke Patients Only) Pre-Morbid Rankin Score: No significant disability Modified Rankin: Moderately severe disability     Balance Overall balance assessment: Needs assistance   Sitting balance-Leahy Scale: Poor   Postural control: Right lateral lean;Posterior lean   Standing balance-Leahy Scale: Poor                               Pertinent Vitals/Pain Pain Assessment: No/denies pain    Home Living Family/patient expects to be discharged to:: Private residence Living Arrangements: Spouse/significant other Available Help at Discharge: Family;Friend(s);Available 24 hours/day Type of Home: House Home Access: Stairs to enter Entrance Stairs-Rails: Left Entrance Stairs-Number of Steps: 3   Home Layout: One level  Home Equipment: Grab bars - tub/shower;Grab bars - toilet;Cane - single point      Prior Function Prior Level of Function :  Independent/Modified Independent               ADLs Comments: Had returned to PLOF since last CVA and enjoys walking on trails; had not returned to driving     Hand Dominance   Dominant Hand: Right    Extremity/Trunk Assessment   Upper Extremity Assessment Upper Extremity Assessment: Defer to OT evaluation RUE Deficits / Details: AROM overall WFL; apparent motor sensory processing deficits; "alien arm" features; attempts to use funcitonally however requires significant assistance RUE Coordination: decreased fine motor;decreased gross motor    Lower Extremity Assessment Lower Extremity Assessment: Generalized weakness;RLE deficits/detail RLE Deficits / Details: apraxic, AROM WFL RLE Coordination: decreased fine motor;decreased gross motor    Cervical / Trunk Assessment Cervical / Trunk Assessment: Other exceptions (R bias) Cervical / Trunk Exceptions: unable to maintain midline postural control EOB or supported in chair  Communication   Communication: No difficulties  Cognition Arousal/Alertness: Awake/alert Behavior During Therapy: Flat affect Overall Cognitive Status: Impaired/Different from baseline Area of Impairment: Orientation;Attention;Following commands;Memory;Safety/judgement;Awareness;Problem solving                 Orientation Level: Disoriented to;Time;Situation Current Attention Level: Selective Memory: Decreased recall of precautions;Decreased short-term memory Following Commands: Follows one step commands with increased time Safety/Judgement: Decreased awareness of safety;Decreased awareness of deficits Awareness: Emergent Problem Solving: Slow processing;Decreased initiation;Difficulty sequencing;Requires verbal cues;Requires tactile cues          General Comments General comments (skin integrity, edema, etc.): Husband presetn throughout session; VSS    Exercises     Assessment/Plan    PT Assessment Patient needs continued PT services   PT Problem List Decreased mobility;Decreased strength;Decreased coordination;Decreased knowledge of precautions;Decreased balance;Decreased activity tolerance;Decreased safety awareness       PT Treatment Interventions DME instruction;Therapeutic activities;Gait training;Therapeutic exercise;Patient/family education;Balance training;Functional mobility training;Stair training;Neuromuscular re-education    PT Goals (Current goals can be found in the Care Plan section)  Acute Rehab PT Goals Patient Stated Goal: home PT Goal Formulation: With patient/family Time For Goal Achievement: 12/18/20 Potential to Achieve Goals: Good    Frequency Min 4X/week   Barriers to discharge        Co-evaluation PT/OT/SLP Co-Evaluation/Treatment: Yes Reason for Co-Treatment: Complexity of the patient's impairments (multi-system involvement);For patient/therapist safety;To address functional/ADL transfers PT goals addressed during session: Mobility/safety with mobility;Balance OT goals addressed during session: ADL's and self-care       AM-PAC PT "6 Clicks" Mobility  Outcome Measure Help needed turning from your back to your side while in a flat bed without using bedrails?: A Little Help needed moving from lying on your back to sitting on the side of a flat bed without using bedrails?: A Lot Help needed moving to and from a bed to a chair (including a wheelchair)?: A Lot Help needed standing up from a chair using your arms (e.g., wheelchair or bedside chair)?: A Lot Help needed to walk in hospital room?: A Lot Help needed climbing 3-5 steps with a railing? : Total 6 Click Score: 12    End of Session Equipment Utilized During Treatment: Gait belt Activity Tolerance: Patient tolerated treatment well Patient left: in chair;with call bell/phone within reach;with family/visitor present Nurse Communication: Mobility status PT Visit Diagnosis: Unsteadiness on feet (R26.81);Other abnormalities of gait  and mobility (R26.89);Apraxia (R48.2);Muscle weakness (generalized) (M62.81)    Time: 2440-1027 PT Time  Calculation (min) (ACUTE ONLY): 27 min   Charges:   PT Evaluation $PT Eval Moderate Complexity: 1 Mod          Lorrin Goodell, [place]  Office # [phone] Pager [phone]   [name] 12/04/2020, 1:22 PM

## 2020-12-05 DIAGNOSIS — I6300 Cerebral infarction due to thrombosis of unspecified precerebral artery: Secondary | ICD-10-CM

## 2020-12-05 LAB — BASIC METABOLIC PANEL
Anion gap: 8 (ref 5–15)
BUN: 23 mg/dL (ref 8–23)
CO2: 25 mmol/L (ref 22–32)
Calcium: 8.9 mg/dL (ref 8.9–10.3)
Chloride: 102 mmol/L (ref 98–111)
Creatinine, Ser: 0.81 mg/dL (ref 0.44–1.00)
GFR, Estimated: >60 mL/min (ref >60)
Glucose, Bld: 105 mg/dL — ABNORMAL HIGH (ref 70–99)
Potassium: 4.2 mmol/L (ref 3.5–5.1)
Sodium: 135 mmol/L (ref 135–145)

## 2020-12-05 LAB — GLUCOSE, CAPILLARY
Glucose-Capillary: 104 mg/dL — ABNORMAL HIGH (ref 70–99)
Glucose-Capillary: 113 mg/dL — ABNORMAL HIGH (ref 70–99)
Glucose-Capillary: 116 mg/dL — ABNORMAL HIGH (ref 70–99)
Glucose-Capillary: 118 mg/dL — ABNORMAL HIGH (ref 70–99)

## 2020-12-05 LAB — CBC
HCT: 34.7 % — ABNORMAL LOW (ref 36.0–46.0)
Hemoglobin: 11.4 g/dL — ABNORMAL LOW (ref 12.0–15.0)
MCH: 30.3 pg (ref 26.0–34.0)
MCHC: 32.9 g/dL (ref 30.0–36.0)
MCV: 92.3 fL (ref 80.0–100.0)
Platelets: 185 10*3/uL (ref 150–400)
RBC: 3.76 MIL/uL — ABNORMAL LOW (ref 3.87–5.11)
RDW: 12.9 % (ref 11.5–15.5)
WBC: 6.9 10*3/uL (ref 4.0–10.5)
nRBC: 0 % (ref 0.0–0.2)

## 2020-12-05 NOTE — Progress Notes (Signed)
Physical Therapy Treatment Patient Details Name: Jacqueline Bonilla MRN: [id number] DOB: Mar 20, 1955 Today's Date: 12/05/2020   History of Present Illness 65 y.o. female presents with L weakness and facial droop. 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  MRI found punctate acute infarct in high L frontal cortex; Additional punctate areas of faint DWI signal in bilateral precentral gyri; Multiple remote cortical infarcts in bilateral frontal, parietal, and occipital lobes and multiple bilateral cerebellar lacunar infarctsPMH includes CVA, vascular dementia, HTN, thrombocytopenia and ILR.    PT Comments    Pt received in bed, eager to participate in therapy. She required min assist bed mobility, min assist sit to stand, mod assist SPT without AD, and mod assist ambulation 50' with RW. Continues to present with R bias, although improving. Pt able to cross midline visually with cues but only sustaining for a few seconds. Pt assisted to/from [facility] prior to positioning in recliner with feet elevated at end of session.    Recommendations for follow up therapy are one component of a multi-disciplinary discharge planning process, led by the attending physician.  Recommendations may be updated based on patient status, additional functional criteria and insurance authorization.  Follow Up Recommendations  Acute inpatient rehab (3hours/day)     Assistance Recommended at Discharge Frequent or constant Supervision/Assistance  Equipment Recommendations  Other (comment) (TBD)    Recommendations for Other Services       Precautions / Restrictions Precautions Precautions: Fall;Other (comment) Precaution Comments: apraxic     Mobility  Bed Mobility Overal bed mobility: Needs Assistance Bed Mobility: Supine to Sit     Supine to sit: Min assist;HOB elevated     General bed mobility comments: increased time, cues for sequencing     Transfers Overall transfer level: Needs assistance Equipment used: Ambulation equipment used Transfers: Sit to/from Stand;Bed to chair/wheelchair/BSC Sit to Stand: Min assist Stand pivot transfers: Mod assist         General transfer comment: min assist to power up to stand, mod assist SPT without AD recliner <> BSC    Ambulation/Gait Ambulation/Gait assistance: Mod assist Gait Distance (Feet): 50 Feet Assistive device: Rolling walker (2 wheels) Gait Pattern/deviations: Step-through pattern;Decreased stride length;Drifts right/left;Narrow base of support Gait velocity: decreased Gait velocity interpretation: <1.31 ft/sec, indicative of household ambulator   General Gait Details: cues to push with RUE as RW tending to lag behind on R, increased difficulty with managing turns and obstacles. Fatigues quickly.   Stairs             Wheelchair Mobility    Modified Rankin (Stroke Patients Only) Modified Rankin (Stroke Patients Only) Pre-Morbid Rankin Score: No significant disability Modified Rankin: Moderately severe disability     Balance Overall balance assessment: Needs assistance Sitting-balance support: Feet supported;Single extremity supported Sitting balance-Leahy Scale: Fair   Postural control: Right lateral lean;Posterior lean Standing balance support: During functional activity;No upper extremity supported;Bilateral upper extremity supported Standing balance-Leahy Scale: Poor Standing balance comment: reliant on external support                            Cognition Arousal/Alertness: Awake/alert Behavior During Therapy: WFL for tasks assessed/performed Overall Cognitive Status: Impaired/Different from baseline Area of Impairment: Orientation;Attention;Following commands;Memory;Safety/judgement;Awareness;Problem solving                 Orientation Level: Disoriented to;Time;Situation Current Attention Level: Selective Memory:  Decreased recall of precautions;Decreased short-term memory Following Commands:  Follows one step commands consistently;Follows one step commands with increased time Safety/Judgement: Decreased awareness of safety;Decreased awareness of deficits Awareness: Emergent Problem Solving: Slow processing;Decreased initiation;Difficulty sequencing;Requires verbal cues;Requires tactile cues General Comments: Pt very pleasant and eager to participate in therapy.        Exercises      General Comments General comments (skin integrity, edema, etc.): HR into 120s with activity      Pertinent Vitals/Pain Pain Assessment: No/denies pain    Home Living                          Prior Function            PT Goals (current goals can now be found in the care plan section) Acute Rehab PT Goals Patient Stated Goal: home Progress towards PT goals: Progressing toward goals    Frequency    Min 4X/week      PT Plan Current plan remains appropriate    Co-evaluation              AM-PAC PT "6 Clicks" Mobility   Outcome Measure  Help needed turning from your back to your side while in a flat bed without using bedrails?: A Little Help needed moving from lying on your back to sitting on the side of a flat bed without using bedrails?: A Little Help needed moving to and from a bed to a chair (including a wheelchair)?: A Lot Help needed standing up from a chair using your arms (e.g., wheelchair or bedside chair)?: A Little Help needed to walk in hospital room?: A Lot Help needed climbing 3-5 steps with a railing? : Total 6 Click Score: 14    End of Session Equipment Utilized During Treatment: Gait belt Activity Tolerance: Patient tolerated treatment well Patient left: in chair;with call bell/phone within reach;with chair alarm set Nurse Communication: Mobility status PT Visit Diagnosis: Unsteadiness on feet (R26.81);Other abnormalities of gait and mobility (R26.89);Apraxia  (R48.2);Muscle weakness (generalized) (M62.81)     Time: 2549-8264 PT Time Calculation (min) (ACUTE ONLY): 27 min  Charges:  $Gait Training: 8-22 mins $Therapeutic Activity: 8-22 mins                     Lorrin Goodell, PT  Office # [phone] Pager [phone]    [name] 12/05/2020, 9:32 AM

## 2020-12-05 NOTE — Progress Notes (Incomplete)
PMR Admission Coordinator Pre-Admission Assessment   Patient: Jacqueline Bonilla is an 65 y.o., female MRN: [id number] DOB: 09/28/1955 Height:   Weight: 54.4 kg   Insurance Information HMO: yes    PPO:      PCP:      IPA:      80/20:      OTHER:  PRIMARY: [employer]      Policy#: [id number]      Subscriber: patient CM Name: ***      Phone#: ***     Fax#: *** Pre-Cert#: ***      Employer: *** Benefits:  Phone #: ***     Name: *** Eff. Date: ***     Deduct: ***      Out of Pocket Max: ***      Life Max: *** CIR: ***      SNF: *** Outpatient: ***     Co-Pay: *** Home Health: ***      Co-Pay: *** DME: ***     Co-Pay: *** Providers: in-network SECONDARY:       Policy#:      Phone#:    Financial Counselor:       Phone#:    The Actuary for patients in Inpatient Rehabilitation Facilities with attached Privacy Act [place] Records was provided and verbally reviewed with: {CHL IP Patient Family CH:[id number]}   Emergency Contact Information Contact Information       Name Relation Home Work Mobile    Kempen,[name] Spouse     [phone]           Current Medical History  Patient Admitting Diagnosis: stroke History of Present Illness: Pt is a 65 year old female with medical hx significant for: anemia, anxiety, depression, two prior strokes in the past 14 months, HTN, loop recorder implant (10/2019), osteoporosis, thrombocytopenia. Pt presented to hospital on 12/01/20 as a Code Stroke. Pt had acute onset of aphasia and worsened right-sided incoordination. CT head negative for acute abnormality. CT head/neck showed occlusion of distal left M2 MCA branch at posterior aspect of sylvian fissure. Pt received TNK. Pt had mechanical thrombectomy performed on 12/1. SAH noted in left sylvian fissure on post procedural CT. Repeat CT showed worsening of left SAH post IR procedure. Pt had TNK reversal and was given TXA. Another repeat CT showed stable SAH.  Pt noted to have seizure like activity on 12/02/20. MRI showed small scattered left MCA infarcts with small amount of SAH. MRA showed recanalization of left M2. EEG revealed evidence of cortical dysfunction and an epileptogenicity arising from left frontotemporal region. left spikes but no seizure. Posey belt placed d/t agitation. Cortrak placed on 12/2. Diet was able to be advanced to regular/thin liquids when pt passed swallow function assessment. Therapy evaluations completed and CIR recommended d/t pt's deficits in functional mobility and ability to complete ADLs independently.  Complete NIHSS TOTAL: 6   Patient's medical record from [facility] has been reviewed by the rehabilitation admission coordinator and physician.   Past Medical History      Past Medical History:  Diagnosis Date   Anemia     Anxiety     Depression     Dysmenorrhea     Endometriosis     Fibroid     Hypertension     Microhematuria      negative workup   Osteoporosis     Tachycardia     Thrombocytopenia ([place])        Has the patient  had major surgery during 100 days prior to admission? Yes   Family History   family history includes Cancer in her father; Dementia in her mother; Heart attack in her maternal grandfather; Polymyositis in her sister.   Current Medications   Current Facility-Administered Medications:     stroke: mapping our early stages of recovery book, , Does not apply, Once, Kerney Elbe, MD   acetaminophen (TYLENOL) tablet 650 mg, 650 mg, Oral, Q4H PRN **OR** acetaminophen (TYLENOL) 160 MG/5ML solution 650 mg, 650 mg, Per Tube, Q4H PRN, 650 mg at 12/02/20 2357 **OR** acetaminophen (TYLENOL) suppository 650 mg, 650 mg, Rectal, Q4H PRN, de Sindy Messing, Erven Colla, MD   aspirin chewable tablet 81 mg, 81 mg, Oral, Daily, Palikh, Gaurang M, MD, 81 mg at 12/04/20 1114   atorvastatin (LIPITOR) tablet 40 mg, 40 mg, Oral, Daily, Palikh, Velna Ochs, MD   bethanechol (URECHOLINE) tablet 5 mg, 5  mg, Oral, TID, Reeves Forth, Velna Ochs, MD, 5 mg at 12/04/20 1115   Chlorhexidine Gluconate Cloth 2 % PADS 6 each, 6 each, Topical, Daily, Kerney Elbe, MD, 6 each at 12/04/20 1030   cholecalciferol (VITAMIN D3) tablet 1,000 Units, 1,000 Units, Oral, Daily, Kerney Elbe, MD, 1,000 Units at 12/04/20 1113   feeding supplement (OSMOLITE 1.5 CAL) liquid 1,000 mL, 1,000 mL, Per Tube, Continuous, Rosalin Hawking, MD, Stopped at 12/03/20 1900   feeding supplement (PROSource TF) liquid 45 mL, 45 mL, Per Tube, BID, Rosalin Hawking, MD, 45 mL at 12/03/20 2111   haloperidol lactate (HALDOL) injection 5 mg, 5 mg, Intravenous, Q6H PRN, Rosalin Hawking, MD, 5 mg at 12/03/20 2331   insulin aspart (novoLOG) injection 0-9 Units, 0-9 Units, Subcutaneous, Q4H, Kerney Elbe, MD, 1 Units at 12/04/20 0938   iohexol (OMNIPAQUE) 300 MG/ML solution 100 mL, 100 mL, Intra-arterial, Once PRN, de Sindy Messing, Erven Colla, MD   labetalol (NORMODYNE) injection 5-20 mg, 5-20 mg, Intravenous, Q10 min PRN, Rosalin Hawking, MD   levETIRAcetam (KEPPRA) IVPB 500 mg/100 mL premix, 500 mg, Intravenous, Q12H, Rosalin Hawking, MD, Stopped at 12/04/20 1321   losartan (COZAAR) tablet 25 mg, 25 mg, Oral, Daily, Palikh, Gaurang M, MD, 25 mg at 12/04/20 1112   methylPREDNISolone sodium succinate (SOLU-MEDROL) 40 mg/mL injection 40 mg, 40 mg, Intravenous, Once, Tyrone Nine, Dan, DO   ondansetron [facility]) injection 4 mg, 4 mg, Intravenous, Q6H PRN, de Sindy Messing, [place], MD   pantoprazole (PROTONIX) EC tablet 40 mg, 40 mg, Oral, Daily, Ardyth Harps B, RPH, 40 mg at 12/04/20 1111   senna-docusate (Senokot-S) tablet 1 tablet, 1 tablet, Oral, QHS PRN, Kerney Elbe, MD   tamsulosin [facility]) capsule 0.4 mg, 0.4 mg, Oral, QPC supper, Palikh, Gaurang M, MD, 0.4 mg at 12/03/20 1719   thiamine tablet 100 mg, 100 mg, Oral, Daily, Palikh, Gaurang M, MD, 100 mg at 12/04/20 1115   Patients Current Diet:  Diet Order                  Diet regular Room service  appropriate? Yes; Fluid consistency: Thin  Diet effective now                         Precautions / Restrictions Precautions Precautions: Fall, Other (comment) Precaution Comments: apraxic; coretrack Restrictions Weight Bearing Restrictions: No    Has the patient had 2 or more falls or a fall with injury in the past year? No   Prior Activity Level Community (5-7x/wk): gets out house ~5 days/week   Prior Functional Level  Self Care: Did the patient need help bathing, dressing, using the toilet or eating? Independent   Indoor Mobility: Did the patient need assistance with walking from room to room (with or without device)? Independent   Stairs: Did the patient need assistance with internal or external stairs (with or without device)? Independent   Functional Cognition: Did the patient need help planning regular tasks such as shopping or remembering to take medications? Independent   Patient Information Are you of Hispanic, Latino/a,or Spanish origin?: A. No, not of Hispanic, Latino/a, or Spanish origin What is your race?: A. White Do you need or want an interpreter to communicate with a doctor or health care staff?: 0. No   Patient's Response To:  Health Literacy and Transportation Is the patient able to respond to health literacy and transportation needs?: Yes Health Literacy - How often do you need to have someone help you when you read instructions, pamphlets, or other written material from your doctor or pharmacy?: Never In the past 12 months, has lack of transportation kept you from medical appointments or from getting medications?: No In the past 12 months, has lack of transportation kept you from meetings, work, or from getting things needed for daily living?: No   Home Assistive Devices / Equipment Home Equipment: Grab bars - tub/shower, Grab bars - toilet, Cane - single point   Prior Device Use: Indicate devices/aids used by the patient prior to current illness,  exacerbation or injury? None of the above   Current Functional Level Cognition   Overall Cognitive Status: Impaired/Different from baseline Current Attention Level: Selective Orientation Level: Oriented X4 Following Commands: Follows one step commands with increased time Safety/Judgement: Decreased awareness of safety, Decreased awareness of deficits    Extremity Assessment (includes Sensation/Coordination)   Upper Extremity Assessment: Defer to OT evaluation RUE Deficits / Details: AROM overall WFL; apparent motor sensory processing deficits; "alien arm" features; attempts to use funcitonally however requires significant assistance RUE Coordination: decreased fine motor, decreased gross motor  Lower Extremity Assessment: Generalized weakness, RLE deficits/detail RLE Deficits / Details: apraxic, AROM WFL RLE Coordination: decreased fine motor, decreased gross motor     ADLs   Overall ADL's : Needs assistance/impaired Eating/Feeding: Moderate assistance Eating/Feeding Details (indicate cue type and reason): will bring food to her mouth  "without having anything in her hand" per husband Grooming: Moderate assistance Grooming Details (indicate cue type and reason): brushing her teeth without a toothbrush in her hand Lower Body Bathing: Moderate assistance Upper Body Dressing : Moderate assistance Lower Body Dressing: Maximal assistance Toilet Transfer: Moderate assistance Toileting- Clothing Manipulation and Hygiene: Maximal assistance Functional mobility during ADLs: Moderate assistance, +2 for safety/equipment     Mobility   Overal bed mobility: Needs Assistance Bed Mobility: Supine to Sit Supine to sit: Mod assist General bed mobility comments: increased time, cues for sequencing     Transfers   Overall transfer level: Needs assistance Equipment used: 2 person hand held assist Transfers: Sit to/from Stand Sit to Stand: Min assist, +2 safety/equipment General transfer  comment: assist to power up and stabilize balance     Ambulation / Gait / Stairs / Wheelchair Mobility   Ambulation/Gait Ambulation/Gait assistance: +2 physical assistance, Min assist Gait Distance (Feet): 5 Feet Assistive device: 2 person hand held assist Gait Pattern/deviations: Narrow base of support, Shuffle General Gait Details: cues for sequencing, assist to maintain balance, R lateral lean, decreased foot clearance bilat Gait velocity: decreased     Posture / Balance Balance Overall balance  assessment: Needs assistance Sitting balance-Leahy Scale: Poor Postural control: Right lateral lean, Posterior lean Standing balance-Leahy Scale: Poor     Special needs/care consideration Skin surgical incision: groin/anterior, proximal, right and Diabetic management novoLOG 0-9 units every 4 hours    Previous Home Environment (from acute therapy documentation) Living Arrangements: Spouse/significant other  Lives With: Spouse Available Help at Discharge: Family, Friend(s), Available 24 hours/day Type of Home: House Home Layout: One level Home Access: Stairs to enter Entrance Stairs-Rails: Left Entrance Stairs-Number of Steps: 3 Bathroom Shower/Tub: Multimedia programmer: Handicapped height Bathroom Accessibility: Yes How Accessible: Accessible via walker [place]: No   Discharge Living Setting Plans for Discharge Living Setting: Patient's home Type of Home at Discharge: House Discharge Home Layout: One level Discharge Home Access: Stairs to enter Entrance Stairs-Rails: Left Entrance Stairs-Number of Steps: 3 Discharge Bathroom Shower/Tub: Walk-in shower Discharge Bathroom Toilet: Handicapped height Discharge Bathroom Accessibility: Yes How Accessible: Accessible via walker Does the patient have any problems obtaining your medications?: No   Social/Family/Support Systems Patient Roles: Spouse Anticipated Caregiver: Yarel Kilcrease, husband Anticipated  Caregiver's Contact Information: [phone] Caregiver Availability: 24/7 Discharge Plan Discussed with Primary Caregiver: Yes Is Caregiver In Agreement with Plan?: Yes Does Caregiver/Family have Issues with Lodging/Transportation while Pt is in Rehab?: No   Goals Patient/Family Goal for Rehab: Supervision: PT/OT, Mod I-Supervision: ST Expected length of stay: 14-16 days Pt/Family Agrees to Admission and willing to participate: Yes Program Orientation Provided & Reviewed with Pt/Caregiver Including Roles  & Responsibilities: Yes   Decrease burden of Care through IP rehab admission: NA   Possible need for SNF placement upon discharge: Not anticipated   Patient Condition: I have reviewed medical records from [facility], spoken with CSW, and patient and spouse. I met with patient at the bedside and discussed via phone for inpatient rehabilitation assessment.  Patient will benefit from ongoing PT, OT, and SLP, can actively participate in 3 hours of therapy a day 5 days of the week, and can make measurable gains during the admission.  Patient will also benefit from the coordinated team approach during an Inpatient Acute Rehabilitation admission.  The patient will receive intensive therapy as well as Rehabilitation physician, nursing, social worker, and care management interventions.  Due to safety, skin/wound care, disease management, medication administration, and patient education the patient requires 24 hour a day rehabilitation nursing.  The patient is currently Min-Mod A with mobility and Mod-Max A with basic ADLs.  Discharge setting and therapy post discharge at home with home health is anticipated.  Patient has agreed to participate in the Acute Inpatient Rehabilitation Program and will admit {Time; today/tomorrow:10263}.   Preadmission Screen Completed By:  Bethel Born, 12/04/2020 3:48 PM ___________________________________________________________________

## 2020-12-05 NOTE — Progress Notes (Signed)
Inpatient Rehab Admissions Coordinator:  Received insurance authorization. Awaiting medical clearance and bed availability. Will continue to follow.   Gayland Curry, [place], [place] Admissions Coordinator [phone]

## 2020-12-05 NOTE — Plan of Care (Signed)
Called by RN that pt off cortrak on regular diet, and CBG stable. Pt now requested to d/c CBG monitoring. I think it is reasonable. She has daily BMP still. Also d/c prosure TF supplement since she is off cortrak.  Rosalin Hawking, MD PhD Stroke Neurology 12/05/2020 9:27 PM

## 2020-12-05 NOTE — Progress Notes (Signed)
None STROKE TEAM PROGRESS NOTE   SUBJECTIVE (INTERVAL HISTORY) Patient is sitting in a bedside chair.  She is awake and communicative.  She is having mild expressive language difficulties but otherwise seems good.  Neurological exam is stable.  Eitel signs are stable.  Patient has transfer orders to go to the floor but no bed available.  She has been evaluated by therapy recommend inpatient rehab.  OBJECTIVE Vitals:   12/05/20 0800 12/05/20 0900 12/05/20 1000 12/05/20 1200  BP: 128/85 (!) 139/113    Pulse: 92 (!) 124 (!) 102   Resp: 14 (!) 26 20   Temp: 97.9 F (36.6 C)   98.5 F (36.9 C)  TempSrc: Oral   Oral  SpO2: 98% 93% 98%   Weight:        CBC:  Recent Labs  Lab 12/01/20 1001 12/02/20 0440 12/04/20 0629 12/05/20 0333  WBC 7.2   < > 6.0 6.9  NEUTROABS 5.1  --   --   --   HGB 12.5   < > 10.3* 11.4*  HCT 38.8   < > 31.7* 34.7*  MCV 93.9   < > 91.6 92.3  PLT 191   < > 180 185   < > = values in this interval not displayed.    Basic Metabolic Panel:  Recent Labs  Lab 12/03/20 0423 12/03/20 1634 12/04/20 0629 12/05/20 0333  NA 136  --  133* 135  K 4.2  --  3.8 4.2  CL 103  --  100 102  CO2 26  --  24 25  GLUCOSE 121*  --  114* 105*  BUN 20  --  19 23  CREATININE 0.87  --  0.75 0.81  CALCIUM 8.7*  --  8.6* 8.9  MG 1.9 1.8  --   --   PHOS 3.9 3.4  --   --     Lipid Panel:  Recent Labs  Lab 12/02/20 0440  CHOL 153  TRIG 81  HDL 48  CHOLHDL 3.2  VLDL 16  LDLCALC 89   HgbA1c:  Lab Results  Component Value Date   HGBA1C 5.7 (H) 12/02/2020   Urine Drug Screen: No results found for: LABOPIA, COCAINSCRNUR, LABBENZ, AMPHETMU, THCU, LABBARB  Alcohol Level No results found for: [facility]  IMAGING  Results for orders placed or performed during the hospital encounter of 12/01/20  CT HEAD WO CONTRAST (5MM)   Narrative   CLINICAL DATA:  Follow-up after neurovascular intervention.  EXAM: CT HEAD WITHOUT CONTRAST  TECHNIQUE: Contiguous axial images were  obtained from the base of the skull through the vertex without intravenous contrast.  COMPARISON:  12/01/2020  FINDINGS: Brain: Allowing for redistribution, unchanged subarachnoid hyperdensity over both hemispheres, left-greater-than-right. A suspected intraparenchymal component in the left parietal lobe is unchanged. There is periventricular hypoattenuation compatible with chronic microvascular disease. Generalized volume loss. No hydrocephalus.  Vascular: No abnormal hyperdensity of the major intracranial arteries or dural venous sinuses. No intracranial atherosclerosis.  Skull: The visualized skull base, calvarium and extracranial soft tissues are normal.  Sinuses/Orbits: No fluid levels or advanced mucosal thickening of the visualized paranasal sinuses. No mastoid or middle ear effusion. The orbits are normal.  IMPRESSION: 1. Unchanged subarachnoid hyperdensity over both hemispheres, left-greater-than-right, likely a combination contrast extravasation and subarachnoid blood. 2. Unchanged subcortical intraparenchymal blood in the left parietal lobe.   Electronically Signed   By: Ulyses Jarred M.D.   On: 12/02/2020 03:36   Results for orders placed or performed during the  hospital encounter of 06/24/20  MR BRAIN WO CONTRAST   Narrative   CLINICAL DATA:  Neuro deficit, acute stroke suspected  EXAM: MRI HEAD WITHOUT CONTRAST  MRA HEAD WITHOUT CONTRAST  TECHNIQUE: Multiplanar, multi-echo pulse sequences of the brain and surrounding structures were acquired without intravenous contrast. Angiographic images of the Circle of Willis were acquired using MRA technique without intravenous contrast.  COMPARISON:  And a CT code stroke. MRI head June 23, 2019. CTA June 30 2019.  FINDINGS: MRI HEAD FINDINGS  Brain: Punctate focus of restricted diffusion in the high left frontal cortex (series 3, image 38). No significant edema or mass effect. Additional punctate areas  of faint DWI signal in bilateral precentral gyri (series 3, images 37 and 39). Redemonstrated multiple remote cortical infarcts in bilateral frontal parietal and occipital lobes. Multiple bilateral cerebellar lacunar infarcts. Moderate scattered patchy confluent T2/FLAIR hyperintensities within the white matter most likely related to chronic microvascular ischemic disease. No hydrocephalus. Age advanced moderate cerebral atrophy with ex vacuo ventricular dilation. No acute hemorrhage. No mass lesion or abnormal mass effect. No extra-axial fluid collection.  Vascular: See below.  Skull and upper cervical spine: Normal marrow signal.  Sinuses/Orbits: Clear sinuses.  Unremarkable orbits.  Other: No sizable mastoid effusions.  MRA HEAD FINDINGS  Anterior circulation: Bilateral ICAs, MCAs, and ACAs are patent without flow-limiting proximal stenosis. No aneurysm identified.  Posterior circulation: The visualized intradural vertebral arteries, basilar artery and posterior cerebral arteries are patent without flow limiting proximal stenosis. No aneurysm identified.  IMPRESSION: MRI:  1. Punctate acute infarct in the high left frontal cortex. Additional faint punctate areas of DWI signal in bilateral precentral gyri are suspicious for additional acute or subacute infarcts, but could potentially represent artifact. Given potential involvement of multiple vascular territories, consider a central embolic etiology. 2. 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 3. Moderate atrophy.  MRA:  No large vessel occlusion or proximal flow limiting stenosis.   Electronically Signed   By: Margaretha Sheffield MD   On: 06/24/2020 20:15   MR ANGIO HEAD WO CONTRAST   Narrative   CLINICAL DATA:  Neuro deficit, acute stroke suspected  EXAM: MRI HEAD WITHOUT CONTRAST  MRA HEAD WITHOUT  CONTRAST  TECHNIQUE: Multiplanar, multi-echo pulse sequences of the brain and surrounding structures were acquired without intravenous contrast. Angiographic images of the Circle of Willis were acquired using MRA technique without intravenous contrast.  COMPARISON:  And a CT code stroke. MRI head June 23, 2019. CTA June 30 2019.  FINDINGS: MRI HEAD FINDINGS  Brain: Punctate focus of restricted diffusion in the high left frontal cortex (series 3, image 38). No significant edema or mass effect. Additional punctate areas of faint DWI signal in bilateral precentral gyri (series 3, images 37 and 39). Redemonstrated multiple remote cortical infarcts in bilateral frontal parietal and occipital lobes. Multiple bilateral cerebellar lacunar infarcts. Moderate scattered patchy confluent T2/FLAIR hyperintensities within the white matter most likely related to chronic microvascular ischemic disease. No hydrocephalus. Age advanced moderate cerebral atrophy with ex vacuo ventricular dilation. No acute hemorrhage. No mass lesion or abnormal mass effect. No extra-axial fluid collection.  Vascular: See below.  Skull and upper cervical spine: Normal marrow signal.  Sinuses/Orbits: Clear sinuses.  Unremarkable orbits.  Other: No sizable mastoid effusions.  MRA HEAD FINDINGS  Anterior circulation: Bilateral ICAs, MCAs, and ACAs are patent without flow-limiting proximal stenosis. No aneurysm identified.  Posterior circulation: The visualized intradural vertebral  arteries, basilar artery and posterior cerebral arteries are patent without flow limiting proximal stenosis. No aneurysm identified.  IMPRESSION: MRI:  1. Punctate acute infarct in the high left frontal cortex. Additional faint punctate areas of DWI signal in bilateral precentral gyri are suspicious for additional acute or subacute infarcts, but could potentially represent artifact. Given potential involvement of multiple  vascular territories, consider a central embolic etiology. 2. 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 3. Moderate atrophy.  MRA:  No large vessel occlusion or proximal flow limiting stenosis.   Electronically Signed   By: Margaretha Sheffield MD   On: 06/24/2020 20:15   Results for orders placed or performed during the hospital encounter of 06/23/19  MR BRAIN W WO CONTRAST   Narrative   [facility]  NEUROIMAGING REPORT   STUDY DATE: 06/23/19 PATIENT NAME: DORREEN VALIENTE DOB: 06-Jul-1955 MRN: [id number]  ORDERING CLINICIAN: [name], MD  CLINICAL HISTORY: 65 year old female with numbness.  EXAM: MR BRAIN W WO CONTRAST  TECHNIQUE: MRI of the brain with and without contrast was obtained  utilizing 5 mm axial slices with T1, T2, T2 flair, SWI and diffusion  weighted views.  T1 sagittal, T2 coronal and postcontrast views in the  axial and coronal plane were obtained. CONTRAST: 16ml multihance  COMPARISON: none  IMAGING SITE: [facility] [address] (1.5 Tesla MRI)    FINDINGS:   Scattered bilateral periventricular, subcortical and juxtacortical DWI  hyperintensities in the bilateral cerebral hemispheres.  Some of these are  hypointense on ADC maps.  Moderate periventricular and subcortical and  cortical foci of T2FLAIR hyperintensities, consistent with chronic  ischemic disease.   No abnormal lesions are seen on diffusion-weighted views to suggest acute  ischemia. The cortical sulci, fissures and cisterns are normal in size and  appearance. Lateral, third and fourth ventricle are normal in size and  appearance. No extra-axial fluid collections are seen. No evidence of mass  effect or midline shift.  No abnormal lesions are seen on post contrast  views.    On sagittal views the posterior fossa, pituitary gland and corpus callosum   are unremarkable. No evidence of intracranial hemorrhage on SWI views. The  orbits and their contents, paranasal sinuses and calvarium are  unremarkable.  Intracranial flow voids are present.     Impression   MRI brain (with and without) demonstrating: -Scattered acute, subacute and chronic ischemic infarcts in the bilateral  cerebral hemispheres. Pattern suggests a proximal / cardioembolic source.  -Moderate chronic small vessel ischemic disease.      INTERPRETING PHYSICIAN:  Penni Bombard, MD Certified in Neurology, Neurophysiology and Neuroimaging  [facility] [address] [place], [place] [address] [phone]        PHYSICAL EXAM Pleasant middle-aged Caucasian lady not in distress. . Afebrile. Head is nontraumatic. Neck is supple without bruit.    Cardiac exam no murmur or gallop. Lungs are clear to auscultation. Distal pulses are well felt.  Temp:  [97.9 F (36.6 C)-99 F (37.2 C)] 98.5 F (36.9 C) (12/05 1200) Pulse Rate:  [69-124] 102 (12/05 1000) Resp:  [11-26] 20 (12/05 1000) BP: (109-145)/(54-113) 139/113 (12/05 0900) SpO2:  [93 %-99 %] 98 % (12/05 1000) Weight:  [54.4 kg] 54.4 kg (12/05 0425)  Mental Status -  Patient is alert, oriented to person and place Able to name objects-- hypophonic and mildly dysarthric Improving receptive aphasia.  Mild expressive aphasia followed most commands.    Cranial Nerves II - XII - II - PERRL. Blinks to threat consistently consistently III, IV, VI - PERRLA, difficulty looking towards the right V - Facial sensation intact bilaterally. VII - Right nasolabial fold flattening. VIII - Hearing & vestibular intact bilaterally. X - Palate elevates symmetrically. XI - Head is midline XII - Tongue protrusion intact.   Motor Strength - Bulk was normal and fasciculations were absent.  Spontaneous movement in all extremities.    Motor Tone - Increased muscle tone in the upper right  extremity   Sensory - not cooperative but could   Coordination - Gross motor intact, ataxia noted in right arm with FTN   Gait and Station - deferred.    ASSESSMENT/PLAN Ms. MYRLA MALANOWSKI is a 65 y.o. female with history of anemia, anxiety, depression, two prior strokes, HTN, loop recorder implant in 10/2019, osteoporosis, and thrombocytopenia presenting with acute onset of aphasia and worsened right sided incoordination relative to her baseline deficit. On arrival to the ED the patient was with dense receptive aphasia and mild extensor posturing of her RUE with mild RUE and RLE weakness. Etiology for patient stroke unclear, still consistent with embolic source.  However loop recorder so far negative for A. fib.  Once stable, may consider further cardioembolic work-up with TEE, may consider empiric anticoagulation treatment.  Currently patient has small SAH from procedure, will start aspirin 81 at this time.  Continue home Lipitor 40.  Patient failed swallow, will put on core track and start tube feeding n.p.o. meds.  Given seizure activity overnight, and EEG left spikes, will continue Keppra 500 twice daily.  BP goal less than 160 given SAH postprocedure.    Stroke:  left distal M2/M3 occlusion s/p TNK and IR, complicated by small SAH with TNK reversal with TXA, etiology recurrent cryptogenic stroke with negative prior work-up CT head - Unchanged subarachnoid hyperdensity over both hemispheres, left-greater-than-right, likely a combination contrast extravasation and subarachnoid blood.  Unchanged subcortical intraparenchymal blood in the left parietal lobe. MRI head Scattered small acute infarcts in the left MCA distribution as above. Subarachnoid hemorrhage overlying the bilateral cerebral hemispheres, not significantly changed compared to the prior CT allowing for difference in modality. The small focus of intraparenchymal hemorrhage in the left parietal lobe is also unchanged.  MRA head   Interval recanalization of the previously occluded left inferior M2 division. No high-grade stenosis or occlusion on the current study. IR - Successful mechanical thrombectomy, small left sylvian subarachnoid hemorrhage vs contrast extravasation on postprocedural flat panel head ct 2D Echo   ejection fraction 55 to 60%.  No cardiac source of embolism. EEG-  evidence of cortical dysfunction and an epileptogenicity arising from left frontotemporal region, likely secondary to underlying structural abnormality.  No seizures  were seen throughout the recording. October 2021 Loop Recorder- negative for Afib Venous Duplex-no evidence of DVT  LDL 89 HgbA1c 5.7 SCDs for VTE prophylaxis aspirin 81 mg daily prior to admission, now on aspirin 325 mg daily. Hold plavix due to [facility] Patient counseled to be compliant with her antithrombotic medications Ongoing aggressive stroke risk factor management Therapy recommendations:  Pending Disposition:  Pending  Previous Stroke- June 2022 Cryptogenic stroke with loop recorder placement MRI/MRA brain done revealing punctate infarct and high "report except faint areas of DWI signal in bilateral precentral gyri question acute/subacute infarct or artifact.  Stroke felt to be embolic recorder was interrogated with negative for A. fib  Possible Seizure  activity Left facial twitching with altered mental status Ativan overnight EEG done 12/02/2020 showed left spikes Loaded with keppra and continue keppra BID  Hypertension Stable  BP goal < 160 given SAH post procedure  Long-term BP goal normotensive Home medications losartan Cleviprex d/c, managed well with home medications and prns  Hyperlipidemia Home meds:  None LDL 89, goal < 70 Start Lipitor 40mg  Continue statin at discharge  Diabetes type II HgbA1c 5.7, goal < 7.0 Controlled  Dysphagia SLP consult complete Recommend regular diet with thin liquids Meds whole with liquid Turn off tube feeds and  monitor oral intake- d/c cortrak  Other Stroke Risk Factors Advanced age  Other Active Problems Vascular Dementia Altered Mental Status Frequent reorientation Sleep hygiene   Hospital day # 4  Patient has primarily recurrent cryptogenic stroke.  Recommend aspirin and Brilinta for 4 weeks followed by aspirin alone.  Also consider possible participation in [place] trial for stroke prevention patient appears interested.  Will discuss with with her husband.  Transfer to neurology floor bed when available.  Transfer to inpatient rehab in the next few days.  Plan check TEE.  Greater than 50% time during this 35-minute visit was spent in counseling and coordination of care and discussion with patient and care team and answering questions.  Antony Contras MD  To contact Stroke Continuity provider, please refer to [email or web address] After hours, contact General Neurology

## 2020-12-06 LAB — BASIC METABOLIC PANEL
Anion gap: 9 (ref 5–15)
BUN: 32 mg/dL — ABNORMAL HIGH (ref 8–23)
CO2: 22 mmol/L (ref 22–32)
Calcium: 8.8 mg/dL — ABNORMAL LOW (ref 8.9–10.3)
Chloride: 101 mmol/L (ref 98–111)
Creatinine, Ser: 0.96 mg/dL (ref 0.44–1.00)
GFR, Estimated: >60 mL/min (ref >60)
Glucose, Bld: 117 mg/dL — ABNORMAL HIGH (ref 70–99)
Potassium: 3.8 mmol/L (ref 3.5–5.1)
Sodium: 132 mmol/L — ABNORMAL LOW (ref 135–145)

## 2020-12-06 LAB — CBC
HCT: 33.3 % — ABNORMAL LOW (ref 36.0–46.0)
Hemoglobin: 11.1 g/dL — ABNORMAL LOW (ref 12.0–15.0)
MCH: 30.5 pg (ref 26.0–34.0)
MCHC: 33.3 g/dL (ref 30.0–36.0)
MCV: 91.5 fL (ref 80.0–100.0)
Platelets: 208 10*3/uL (ref 150–400)
RBC: 3.64 MIL/uL — ABNORMAL LOW (ref 3.87–5.11)
RDW: 12.9 % (ref 11.5–15.5)
WBC: 6.1 10*3/uL (ref 4.0–10.5)
nRBC: 0 % (ref 0.0–0.2)

## 2020-12-06 NOTE — Care Management Important Message (Signed)
Important Message  Patient Details  Name: Jacqueline Bonilla MRN: [id number] Date of Birth: 1955/03/03   Medicare Important Message Given:  Yes     Orbie Pyo 12/06/2020, 2:54 PM

## 2020-12-06 NOTE — Progress Notes (Signed)
Inpatient Rehabilitation Admissions Coordinator   Discussed with Dr Leonie Man, we await TEE Wednesday before pursuing CIR admit. I met at bedside and patient is aware that insurance has approved admit.Danne Baxter, RN, MSN Rehab Admissions Coordinator [phone] 12/06/2020 12:32 PM

## 2020-12-06 NOTE — Progress Notes (Addendum)
None STROKE TEAM PROGRESS NOTE   SUBJECTIVE (INTERVAL HISTORY) Patient is seen in her room with no family a t the bedside.  She has had no acute events overnight and her vital signs are stable.  Her neurological exam is stable.  She will need a TEE tomorrow and can then be discharged to inpatient rehab after that Serum sodium is slightly low at 132 and will trend.  CBC is normal OBJECTIVE Vitals:   12/05/20 2036 12/06/20 0007 12/06/20 0343 12/06/20 0958  BP: 128/82 119/79 108/63 107/71  Pulse: (!) 101 97 95 96  Resp: 18 18 18 18   Temp: 98.6 F (37 C) 97.9 F (36.6 C) 98.7 F (37.1 C) 98 F (36.7 C)  TempSrc: Oral Oral Oral Axillary  SpO2: 99% 97% 97% 100%  Weight:        CBC:  Recent Labs  Lab 12/01/20 1001 12/02/20 0440 12/05/20 0333 12/06/20 0141  WBC 7.2   < > 6.9 6.1  NEUTROABS 5.1  --   --   --   HGB 12.5   < > 11.4* 11.1*  HCT 38.8   < > 34.7* 33.3*  MCV 93.9   < > 92.3 91.5  PLT 191   < > 185 208   < > = values in this interval not displayed.    Basic Metabolic Panel:  Recent Labs  Lab 12/03/20 0423 12/03/20 1634 12/04/20 0629 12/05/20 0333 12/06/20 0141  NA 136  --    < > 135 132*  K 4.2  --    < > 4.2 3.8  CL 103  --    < > 102 101  CO2 26  --    < > 25 22  GLUCOSE 121*  --    < > 105* 117*  BUN 20  --    < > 23 32*  CREATININE 0.87  --    < > 0.81 0.96  CALCIUM 8.7*  --    < > 8.9 8.8*  MG 1.9 1.8  --   --   --   PHOS 3.9 3.4  --   --   --    < > = values in this interval not displayed.    Lipid Panel:  Recent Labs  Lab 12/02/20 0440  CHOL 153  TRIG 81  HDL 48  CHOLHDL 3.2  VLDL 16  LDLCALC 89   HgbA1c:  Lab Results  Component Value Date   HGBA1C 5.7 (H) 12/02/2020   Urine Drug Screen: No results found for: LABOPIA, COCAINSCRNUR, LABBENZ, AMPHETMU, THCU, LABBARB  Alcohol Level No results found for: [facility]  IMAGING  Results for orders placed or performed during the hospital encounter of 12/01/20  CT HEAD WO CONTRAST (5MM)    Narrative   CLINICAL DATA:  Follow-up after neurovascular intervention.  EXAM: CT HEAD WITHOUT CONTRAST  TECHNIQUE: Contiguous axial images were obtained from the base of the skull through the vertex without intravenous contrast.  COMPARISON:  12/01/2020  FINDINGS: Brain: Allowing for redistribution, unchanged subarachnoid hyperdensity over both hemispheres, left-greater-than-right. A suspected intraparenchymal component in the left parietal lobe is unchanged. There is periventricular hypoattenuation compatible with chronic microvascular disease. Generalized volume loss. No hydrocephalus.  Vascular: No abnormal hyperdensity of the major intracranial arteries or dural venous sinuses. No intracranial atherosclerosis.  Skull: The visualized skull base, calvarium and extracranial soft tissues are normal.  Sinuses/Orbits: No fluid levels or advanced mucosal thickening of the visualized paranasal sinuses. No mastoid or middle ear effusion.  The orbits are normal.  IMPRESSION: 1. Unchanged subarachnoid hyperdensity over both hemispheres, left-greater-than-right, likely a combination contrast extravasation and subarachnoid blood. 2. Unchanged subcortical intraparenchymal blood in the left parietal lobe.   Electronically Signed   By: Ulyses Jarred M.D.   On: 12/02/2020 03:36   Results for orders placed or performed during the hospital encounter of 06/24/20  MR BRAIN WO CONTRAST   Narrative   CLINICAL DATA:  Neuro deficit, acute stroke suspected  EXAM: MRI HEAD WITHOUT CONTRAST  MRA HEAD WITHOUT CONTRAST  TECHNIQUE: Multiplanar, multi-echo pulse sequences of the brain and surrounding structures were acquired without intravenous contrast. Angiographic images of the Circle of Willis were acquired using MRA technique without intravenous contrast.  COMPARISON:  And a CT code stroke. MRI head June 23, 2019. CTA June 30 2019.  FINDINGS: MRI HEAD FINDINGS  Brain:  Punctate focus of restricted diffusion in the high left frontal cortex (series 3, image 38). No significant edema or mass effect. Additional punctate areas of faint DWI signal in bilateral precentral gyri (series 3, images 37 and 39). Redemonstrated multiple remote cortical infarcts in bilateral frontal parietal and occipital lobes. Multiple bilateral cerebellar lacunar infarcts. Moderate scattered patchy confluent T2/FLAIR hyperintensities within the white matter most likely related to chronic microvascular ischemic disease. No hydrocephalus. Age advanced moderate cerebral atrophy with ex vacuo ventricular dilation. No acute hemorrhage. No mass lesion or abnormal mass effect. No extra-axial fluid collection.  Vascular: See below.  Skull and upper cervical spine: Normal marrow signal.  Sinuses/Orbits: Clear sinuses.  Unremarkable orbits.  Other: No sizable mastoid effusions.  MRA HEAD FINDINGS  Anterior circulation: Bilateral ICAs, MCAs, and ACAs are patent without flow-limiting proximal stenosis. No aneurysm identified.  Posterior circulation: The visualized intradural vertebral arteries, basilar artery and posterior cerebral arteries are patent without flow limiting proximal stenosis. No aneurysm identified.  IMPRESSION: MRI:  1. Punctate acute infarct in the high left frontal cortex. Additional faint punctate areas of DWI signal in bilateral precentral gyri are suspicious for additional acute or subacute infarcts, but could potentially represent artifact. Given potential involvement of multiple vascular territories, consider a central embolic etiology. 2. 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 3. Moderate atrophy.  MRA:  No large vessel occlusion or proximal flow limiting stenosis.   Electronically Signed   By: Margaretha Sheffield MD   On: 06/24/2020 20:15   MR  ANGIO HEAD WO CONTRAST   Narrative   CLINICAL DATA:  Neuro deficit, acute stroke suspected  EXAM: MRI HEAD WITHOUT CONTRAST  MRA HEAD WITHOUT CONTRAST  TECHNIQUE: Multiplanar, multi-echo pulse sequences of the brain and surrounding structures were acquired without intravenous contrast. Angiographic images of the Circle of Willis were acquired using MRA technique without intravenous contrast.  COMPARISON:  And a CT code stroke. MRI head June 23, 2019. CTA June 30 2019.  FINDINGS: MRI HEAD FINDINGS  Brain: Punctate focus of restricted diffusion in the high left frontal cortex (series 3, image 38). No significant edema or mass effect. Additional punctate areas of faint DWI signal in bilateral precentral gyri (series 3, images 37 and 39). Redemonstrated multiple remote cortical infarcts in bilateral frontal parietal and occipital lobes. Multiple bilateral cerebellar lacunar infarcts. Moderate scattered patchy confluent T2/FLAIR hyperintensities within the white matter most likely related to chronic microvascular ischemic disease. No hydrocephalus. Age advanced moderate cerebral atrophy with ex vacuo ventricular dilation. No acute hemorrhage. No mass lesion or abnormal mass effect.  No extra-axial fluid collection.  Vascular: See below.  Skull and upper cervical spine: Normal marrow signal.  Sinuses/Orbits: Clear sinuses.  Unremarkable orbits.  Other: No sizable mastoid effusions.  MRA HEAD FINDINGS  Anterior circulation: Bilateral ICAs, MCAs, and ACAs are patent without flow-limiting proximal stenosis. No aneurysm identified.  Posterior circulation: The visualized intradural vertebral arteries, basilar artery and posterior cerebral arteries are patent without flow limiting proximal stenosis. No aneurysm identified.  IMPRESSION: MRI:  1. Punctate acute infarct in the high left frontal cortex. Additional faint punctate areas of DWI signal in bilateral precentral gyri  are suspicious for additional acute or subacute infarcts, but could potentially represent artifact. Given potential involvement of multiple vascular territories, consider a central embolic etiology. 2. 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 3. Moderate atrophy.  MRA:  No large vessel occlusion or proximal flow limiting stenosis.   Electronically Signed   By: Margaretha Sheffield MD   On: 06/24/2020 20:15   Results for orders placed or performed during the hospital encounter of 06/23/19  MR BRAIN W WO CONTRAST   Narrative   [facility]  NEUROIMAGING REPORT   STUDY DATE: 06/23/19 PATIENT NAME: MYKELL GENAO DOB: 1955/07/17 MRN: [id number]  ORDERING CLINICIAN: [name], MD  CLINICAL HISTORY: 65 year old female with numbness.  EXAM: MR BRAIN W WO CONTRAST  TECHNIQUE: MRI of the brain with and without contrast was obtained  utilizing 5 mm axial slices with T1, T2, T2 flair, SWI and diffusion  weighted views.  T1 sagittal, T2 coronal and postcontrast views in the  axial and coronal plane were obtained. CONTRAST: 35ml multihance  COMPARISON: none  IMAGING SITE: [facility] [address] (1.5 Tesla MRI)    FINDINGS:   Scattered bilateral periventricular, subcortical and juxtacortical DWI  hyperintensities in the bilateral cerebral hemispheres.  Some of these are  hypointense on ADC maps.  Moderate periventricular and subcortical and  cortical foci of T2FLAIR hyperintensities, consistent with chronic  ischemic disease.   No abnormal lesions are seen on diffusion-weighted views to suggest acute  ischemia. The cortical sulci, fissures and cisterns are normal in size and  appearance. Lateral, third and fourth ventricle are normal in size and  appearance. No extra-axial fluid collections are seen. No evidence of mass  effect or midline  shift.  No abnormal lesions are seen on post contrast  views.    On sagittal views the posterior fossa, pituitary gland and corpus callosum  are unremarkable. No evidence of intracranial hemorrhage on SWI views. The  orbits and their contents, paranasal sinuses and calvarium are  unremarkable.  Intracranial flow voids are present.     Impression   MRI brain (with and without) demonstrating: -Scattered acute, subacute and chronic ischemic infarcts in the bilateral  cerebral hemispheres. Pattern suggests a proximal / cardioembolic source.  -Moderate chronic small vessel ischemic disease.      INTERPRETING PHYSICIAN:  Penni Bombard, MD Certified in Neurology, Neurophysiology and Neuroimaging  [facility] [address] [place], [place] [address] [phone]        PHYSICAL EXAM Pleasant middle-aged Caucasian lady not in distress. . Afebrile. Head is nontraumatic. Neck is supple without bruit.    Cardiac exam no murmur or gallop. Lungs are clear to auscultation. Distal pulses are well felt.  Temp:  [97.8 F (36.6 C)-98.7 F (37.1 C)] 98 F (36.7 C) (12/06 0958) Pulse  Rate:  [95-101] 96 (12/06 0958) Resp:  [12-18] 18 (12/06 0958) BP: (107-130)/(63-82) 107/71 (12/06 0958) SpO2:  [97 %-100 %] 100 % (12/06 0958)  General:  Patient is an alert, well-developed patient in no acute distress   NEURO:  Mental Status: AA&Ox3  Speech/Language: speech is without dysarthria or aphasia.    Cranial Nerves:  II: PERRL. Visual fields full.  III, IV, VI: EOMI. Eyelids elevate symmetrically.  V: Sensation is intact to light touch and symmetrical to face.  VII: Slight right facial droop present VIII: hearing intact to voice. IX, X: Phonation is normal.  XII: tongue is midline without fasciculations. Motor: 5/5 strength to LUE, LLE an RLE, 4/5 to RUE Tone: is normal and bulk is normal Sensation- Intact to light touch bilaterally. Extinction absent to  light touch to DSS.  Coordination: Slight drift in RUE.  Gait- deferred   ASSESSMENT/PLAN Ms. ZANARIA MORELL is a 65 y.o. female with history of anemia, anxiety, depression, two prior strokes, HTN, loop recorder implant in 10/2019, osteoporosis, and thrombocytopenia presenting with acute onset of aphasia and worsened right sided incoordination relative to her baseline deficit. On arrival to the ED the patient was with dense receptive aphasia and mild extensor posturing of her RUE with mild RUE and RLE weakness. Etiology for patient stroke unclear, still consistent with embolic source.  However loop recorder so far negative for A. fib.  Once stable, may consider further cardioembolic work-up with TEE, may consider empiric anticoagulation treatment.  Currently patient has small SAH from procedure, will start aspirin 81 at this time.  Continue home Lipitor 40.  Patient failed swallow, will put on core track and start tube feeding n.p.o. meds.  Given seizure activity overnight, and EEG left spikes, will continue Keppra 500 twice daily.  BP goal less than 160 given SAH postprocedure.  Will obtain TEE before patient is discharged to rehab.  Stroke:  left distal M2/M3 occlusion s/p TNK and IR, complicated by small SAH with TNK reversal with TXA, etiology recurrent cryptogenic stroke with negative prior work-up CT head - Unchanged subarachnoid hyperdensity over both hemispheres, left-greater-than-right, likely a combination contrast extravasation and subarachnoid blood.  Unchanged subcortical intraparenchymal blood in the left parietal lobe. MRI head Scattered small acute infarcts in the left MCA distribution as above. Subarachnoid hemorrhage overlying the bilateral cerebral hemispheres, not significantly changed compared to the prior CT allowing for difference in modality. The small focus of intraparenchymal hemorrhage in the left parietal lobe is also unchanged.  MRA head  Interval recanalization of the  previously occluded left inferior M2 division. No high-grade stenosis or occlusion on the current study. IR - Successful mechanical thrombectomy, small left sylvian subarachnoid hemorrhage vs contrast extravasation on postprocedural flat panel head ct 2D Echo   ejection fraction 55 to 60%.  No cardiac source of embolism. EEG-  evidence of cortical dysfunction and an epileptogenicity arising from left frontotemporal region, likely secondary to underlying structural abnormality.  No seizures  were seen throughout the recording. October 2021 Loop Recorder- negative for Afib Venous Duplex-no evidence of DVT  LDL 89 HgbA1c 5.7 SCDs for VTE prophylaxis aspirin 81 mg daily prior to admission, now on aspirin 325 mg daily. Hold plavix due to [facility] Patient counseled to be compliant with her antithrombotic medications Ongoing aggressive stroke risk factor management Therapy recommendations:  Pending Disposition:  Pending  Previous Stroke- June 2022 Cryptogenic stroke with loop recorder placement MRI/MRA brain done revealing punctate infarct and high "report except faint areas of  DWI signal in bilateral precentral gyri question acute/subacute infarct or artifact.  Stroke felt to be embolic recorder was interrogated with negative for A. fib  Possible Seizure activity Left facial twitching with altered mental status Ativan overnight EEG done 12/02/2020 showed left spikes Loaded with keppra and continue keppra BID  Hypertension Stable  BP goal < 160 given SAH post procedure  Long-term BP goal normotensive Home medications losartan Cleviprex d/c, managed well with home medications and prns  Hyperlipidemia Home meds:  None LDL 89, goal < 70 Start Lipitor 40mg  Continue statin at discharge  Diabetes type II HgbA1c 5.7, goal < 7.0 Controlled  Dysphagia SLP consult complete Recommend regular diet with thin liquids Meds whole with liquid Turn off tube feeds and monitor oral intake- d/c  cortrak  Other Stroke Risk Factors Advanced age  Other Active Problems Vascular Dementia Altered Mental Status Frequent reorientation Sleep hygiene   Hospital day # 5 Recommend TEE tomorrow.  Continue ongoing therapies.  Transfer to inpatient rehab after TEE hopefully in the next day or 2.  Discussed with patient and answered questions.  Patient and her grandson interested in considering participation in the [place] trial for stroke prevention.  Greater than 50% time during this 25-minute visit was spent on counseling and coordination of care about her recurrent cryptogenic strokes and discussion about stroke prevention Antony Contras ,   Stroke Neurology See Amion for schedule and pager information 12/06/2020 1:36 PM

## 2020-12-06 NOTE — Progress Notes (Signed)
Physical Therapy Treatment Patient Details Name: Jacqueline Bonilla MRN: [id number] DOB: 23-Jun-1955 Today's Date: 12/06/2020   History of Present Illness 65 y.o. female presents with L weakness and facial droop. 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  MRI found punctate acute infarct in high L frontal cortex; Additional punctate areas of faint DWI signal in bilateral precentral gyri; Multiple remote cortical infarcts in bilateral frontal, parietal, and occipital lobes and multiple bilateral cerebellar lacunar infarcts. PMH includes CVA, vascular dementia, HTN, thrombocytopenia and ILR.    PT Comments    Pt motivated to progress mobility. Pt ambulatory without AD today, but does require assist to steady and correct frequent veering R in hallway. Pt requires rest breaks and intermittent HHA during gait. Pt demonstrating mod incoordination RLE, PT focusing on step ups with alternating leading leg and target tapping with bilat feet which appropriately challenged pt. IPR remains appropriate d/c plan.     Recommendations for follow up therapy are one component of a multi-disciplinary discharge planning process, led by the attending physician.  Recommendations may be updated based on patient status, additional functional criteria and insurance authorization.  Follow Up Recommendations  Acute inpatient rehab (3hours/day)     Assistance Recommended at Discharge Frequent or constant Supervision/Assistance  Equipment Recommendations  Other (comment) (TBD)    Recommendations for Other Services       Precautions / Restrictions Precautions Precautions: Fall;Other (comment) Precaution Comments: apraxic, ataxic Restrictions Weight Bearing Restrictions: No     Mobility  Bed Mobility Overal bed mobility: Needs Assistance Bed Mobility: Supine to Sit;Sit to Supine     Supine to sit: Min assist Sit to supine: Mod assist;+2 for  safety/equipment   General bed mobility comments: min assist for supine to sit for LE progression to EOB, mod assist +2 for return to supine for LE lifting into bed, boost up in bed with +2 assist.    Transfers Overall transfer level: Needs assistance Equipment used: None Transfers: Sit to/from Stand Sit to Stand: Min assist           General transfer comment: min assist for rise and steady, STS x4 throughout session    Ambulation/Gait Ambulation/Gait assistance: Min assist Gait Distance (Feet): 100 Feet (x2- seated rest break) Assistive device: 1 person hand held assist;None Gait Pattern/deviations: Step-through pattern;Decreased stride length;Drifts right/left;Narrow base of support;Staggering right Gait velocity: decr     General Gait Details: assist to steady, guide pt trajectory as pt veering R multiple times without good awareness of this. Pt with episodes of near-scissoring   Stairs             Wheelchair Mobility    Modified Rankin (Stroke Patients Only) Modified Rankin (Stroke Patients Only) Pre-Morbid Rankin Score: No significant disability Modified Rankin: Moderately severe disability     Balance Overall balance assessment: Needs assistance Sitting-balance support: Feet supported;Single extremity supported Sitting balance-Leahy Scale: Fair   Postural control: Right lateral lean Standing balance support: During functional activity;No upper extremity supported Standing balance-Leahy Scale: Fair                              Cognition Arousal/Alertness: Awake/alert Behavior During Therapy: WFL for tasks assessed/performed Overall Cognitive Status: Impaired/Different from baseline Area of Impairment: Attention;Following commands;Memory;Safety/judgement;Awareness;Problem solving                   Current Attention Level: Selective Memory: Decreased recall of precautions;Decreased  short-term memory Following Commands: Follows one  step commands consistently;Follows one step commands with increased time Safety/Judgement: Decreased awareness of safety;Decreased awareness of deficits Awareness: Emergent Problem Solving: Slow processing;Decreased initiation;Difficulty sequencing;Requires verbal cues;Requires tactile cues General Comments: requires step-by-step cues for tasks.        Exercises Other Exercises Other Exercises: step ups x10 each L and R leading, step-by-step verbal and tactile cues for sequencing Other Exercises: Toe taps on target x10 bilat    General Comments        Pertinent Vitals/Pain Pain Assessment: No/denies pain    Home Living                          Prior Function            PT Goals (current goals can now be found in the care plan section) Acute Rehab PT Goals Patient Stated Goal: home PT Goal Formulation: With patient Time For Goal Achievement: 12/18/20 Potential to Achieve Goals: Good Progress towards PT goals: Progressing toward goals    Frequency    Min 4X/week      PT Plan Current plan remains appropriate    Co-evaluation              AM-PAC PT "6 Clicks" Mobility   Outcome Measure  Help needed turning from your back to your side while in a flat bed without using bedrails?: A Little Help needed moving from lying on your back to sitting on the side of a flat bed without using bedrails?: A Little Help needed moving to and from a bed to a chair (including a wheelchair)?: A Little Help needed standing up from a chair using your arms (e.g., wheelchair or bedside chair)?: A Little Help needed to walk in hospital room?: A Lot Help needed climbing 3-5 steps with a railing? : Total 6 Click Score: 15    End of Session Equipment Utilized During Treatment: Gait belt Activity Tolerance: Patient tolerated treatment well Patient left: in bed;with bed alarm set;with call bell/phone within reach;with nursing/sitter in room Nurse Communication: Mobility  status PT Visit Diagnosis: Unsteadiness on feet (R26.81);Other abnormalities of gait and mobility (R26.89);Apraxia (R48.2);Muscle weakness (generalized) (M62.81)     Time: 5102-5852 PT Time Calculation (min) (ACUTE ONLY): 18 min  Charges:  $Gait Training: 8-22 mins                     Stacie Glaze, PT DPT Acute Rehabilitation Services Pager [phone]  Office [phone]    [place] 12/06/2020, 1:30 PM

## 2020-12-06 NOTE — Progress Notes (Signed)
Occupational Therapy Treatment Patient Details Name: Jacqueline Bonilla MRN: [id number] DOB: [birth year] Today's Date: 12/06/2020   History of present illness 65 y.o. female presents with L weakness and facial droop. 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  MRI found punctate acute infarct in high L frontal cortex; Additional punctate areas of faint DWI signal in bilateral precentral gyri; Multiple remote cortical infarcts in bilateral frontal, parietal, and occipital lobes and multiple bilateral cerebellar lacunar infarcts. PMH includes CVA, vascular dementia, HTN, thrombocytopenia and ILR.   OT comments  Jacqueline Bonilla is making functional progress towards her goals. She was min A for bed mobility and transfers with AD and without. She continues to have poor environmental awareness and required max cues and min physical A for attention to obstacles and to stay in the middle of the hallway. She was able to groom in standing with min guard with great use of her RUE given increased time and effort. She will continue to benefit from OT acutely. D/c plan remains appropriate.    Recommendations for follow up therapy are one component of a multi-disciplinary discharge planning process, led by the attending physician.  Recommendations may be updated based on patient status, additional functional criteria and insurance authorization.    Follow Up Recommendations  Acute inpatient rehab (3hours/day)    Assistance Recommended at Discharge Frequent or constant Supervision/Assistance  Equipment Recommendations  BSC/3in1       Precautions / Restrictions Precautions Precautions: Fall;Other (comment) Precaution Comments: apraxic, ataxic Restrictions Weight Bearing Restrictions: No       Mobility Bed Mobility Overal bed mobility: Needs Assistance Bed Mobility: Supine to Sit;Sit to Supine     Supine to sit: Min assist Sit to supine: Mod assist;+2 for  safety/equipment   General bed mobility comments: min A to adjust hips towards EOB, pt scooting several times without making progress, ultimately needing assist    Transfers Overall transfer level: Needs assistance Equipment used: None;Rolling walker (2 wheels) Transfers: Sit to/from Stand Sit to Stand: Min assist           General transfer comment: transfers with and without RW, min A for both     Balance Overall balance assessment: Needs assistance Sitting-balance support: Feet supported;Single extremity supported Sitting balance-Leahy Scale: Fair   Postural control: Right lateral lean Standing balance support: During functional activity;No upper extremity supported Standing balance-Leahy Scale: Fair                             ADL either performed or assessed with clinical judgement   ADL Overall ADL's : Needs assistance/impaired     Grooming: Min Adult nurse Transfer: Minimal assistance;Ambulation;Rolling walker (2 wheels) Toilet Transfer Details (indicate cue type and reason): min A for verbal cues and safety         Functional mobility during ADLs: Minimal assistance;Rolling walker (2 wheels) General ADL Comments: min A for verbal cues, pt walking into obstacles and drifting towards the L, not attending to task and needs assist for RUE    Extremity/Trunk Assessment Upper Extremity Assessment RUE Deficits / Details: AROM overall WFL; apparent motor sensory processing deficits; "alien arm" features; using functionally for grooming with incrsaed effort RUE Coordination: decreased fine motor;decreased gross motor   Lower Extremity Assessment Lower Extremity Assessment: Defer to PT evaluation  Vision   Vision Assessment?: Vision impaired- to be further tested in functional context;Yes Eye Alignment: Within Functional Limits Ocular Range of Motion: Within Functional Limits Alignment/Gaze  Preference: Within Defined Limits Tracking/Visual Pursuits: Decreased smoothness of horizontal tracking;Decreased smoothness of vertical tracking Saccades: Additional head turns occurred during testing;Additional eye shifts occurred during testing;Impaired - to be further tested in functional context;Decreased speed of saccadic movement Convergence: Within functional limits Depth Perception: Overshoots;Undershoots   Perception     Praxis      Cognition Arousal/Alertness: Awake/alert Behavior During Therapy: WFL for tasks assessed/performed;Impulsive Overall Cognitive Status: Impaired/Different from baseline Area of Impairment: Attention;Following commands;Memory;Safety/judgement;Awareness;Problem solving                 Orientation Level: Disoriented to;Time;Situation Current Attention Level: Selective Memory: Decreased recall of precautions;Decreased short-term memory Following Commands: Follows one step commands consistently;Follows one step commands with increased time Safety/Judgement: Decreased awareness of safety;Decreased awareness of deficits Awareness: Emergent Problem Solving: Slow processing;Decreased initiation;Difficulty sequencing;Requires verbal cues;Requires tactile cues General Comments: requries step by step. required several cues for safety and navigating in hallway. max cues to find her room "37" attempting to walk into several rooms that were not hers          Exercises Other Exercises Other Exercises: step ups x10 each L and R leading, step-by-step verbal and tactile cues for sequencing Other Exercises: Toe taps on target x10 bilat   Shoulder Instructions       General Comments VSS on RA, pt has sensory brush in room given to her by her sister    Pertinent Vitals/ Pain       Pain Assessment: No/denies pain   Frequency  Min 2X/week        Progress Toward Goals  OT Goals(current goals can now be found in the care plan section)  Progress  towards OT goals: Progressing toward goals  Acute Rehab OT Goals OT Goal Formulation: With patient/family Time For Goal Achievement: 12/18/20 Potential to Achieve Goals: Good ADL Goals Pt Will Perform Eating: with set-up;with supervision;sitting Pt Will Perform Grooming: with set-up;with supervision Pt Will Perform Upper Body Bathing: with set-up;with supervision Pt Will Perform Lower Body Bathing: with min guard assist Pt Will Transfer to Toilet: with min guard assist;bedside commode;stand pivot transfer Additional ADL Goal #1: Maintain midline postural control EOB with S in preparation for ADL retraining  Plan Discharge plan remains appropriate    Co-evaluation                 AM-PAC OT "6 Clicks" Daily Activity     Outcome Measure   Help from another person eating meals?: A Lot Help from another person taking care of personal grooming?: A Lot Help from another person toileting, which includes using toliet, bedpan, or urinal?: A Lot Help from another person bathing (including washing, rinsing, drying)?: A Lot Help from another person to put on and taking off regular upper body clothing?: A Lot Help from another person to put on and taking off regular lower body clothing?: A Lot 6 Click Score: 12    End of Session Equipment Utilized During Treatment: Gait belt;Rolling walker (2 wheels)  OT Visit Diagnosis: Unsteadiness on feet (R26.81);Other abnormalities of gait and mobility (R26.89);Muscle weakness (generalized) (M62.81);History of falling (Z91.81);Feeding difficulties (R63.3);Apraxia (R48.2);Other symptoms and signs involving cognitive function;Other symptoms and signs involving the nervous system (R29.898)   Activity Tolerance Patient tolerated treatment well   Patient Left in chair;with call bell/phone within reach;with chair alarm set;with family/visitor  present   Nurse Communication Mobility status        Time: 9090-3014 OT Time Calculation (min): 21  min  Charges: OT General Charges $OT Visit: 1 Visit OT Treatments $Therapeutic Activity: 8-22 mins  Desmon Hitchner A Jatorian Renault 12/06/2020, 5:02 PM

## 2020-12-07 DIAGNOSIS — E440 Moderate protein-calorie malnutrition: Secondary | ICD-10-CM | POA: Insufficient documentation

## 2020-12-07 MED ORDER — ENSURE ENLIVE PO LIQD
237.0000 mL | Freq: Two times a day (BID) | ORAL | Status: DC
  Administered 2020-12-07: 16:00:00 237 mL via ORAL

## 2020-12-07 MED ORDER — LEVETIRACETAM 500 MG PO TABS
500.0000 mg | ORAL_TABLET | Freq: Two times a day (BID) | ORAL | Status: DC
  Administered 2020-12-07: 22:00:00 500 mg via ORAL
  Filled 2020-12-07 (×2): qty 1

## 2020-12-07 MED ORDER — ADULT MULTIVITAMIN W/MINERALS CH
1.0000 | ORAL_TABLET | Freq: Every day | ORAL | Status: DC
  Administered 2020-12-07: 17:00:00 1 via ORAL
  Filled 2020-12-07 (×2): qty 1

## 2020-12-07 MED ORDER — TICAGRELOR 90 MG PO TABS
90.0000 mg | ORAL_TABLET | Freq: Two times a day (BID) | ORAL | Status: DC
  Administered 2020-12-07 (×2): 90 mg via ORAL
  Filled 2020-12-07 (×3): qty 1

## 2020-12-07 NOTE — Consult Note (Signed)
   [facility] Inpatient Consult   12/07/2020  ANNALENA PIATT 02-Jul-1955 [id number]  [place] [facility] [ACO] Patient: [place] [employer]  Primary Care Provider:  Horald Pollen, MD, Embedded provider [place]   Patient screened for hospitalization with noted high risk score for unplanned readmission risk and to assess for potential [facility] service needs for post hospital transition.  Review of patient's medical record reveals patient is currently being recommended for inpatient rehab level of care at transition.   Plan:  Continue to follow progress and disposition to assess for post hospital care management needs.    For questions contact:   Natividad Brood, RN BSN [facility] Liaison  [phone] business mobile phone Toll free office [phone]  Fax number: [phone] [email or web address]

## 2020-12-07 NOTE — Progress Notes (Addendum)
None STROKE TEAM PROGRESS NOTE   SUBJECTIVE (INTERVAL HISTORY) Patient is resting comfortably in bed with her husband at the bedside.  She had no acute events overnight and her vital signs remain stable her neuro exam remains unchanged.  Her TEE was moved to tomorrow at 2 PM and plan for discharge to inpatient rehab after that.  Patient should be n.p.o. at midnight for procedure.  We did discuss using Brilinta and aspirin for 30 days.  Serum sodium is slightly low at 132 and will trend.  CBC is normal.  Vital signs stable.  Neurological exam is improving.  Eschar OBJECTIVE Vitals:   12/06/20 1653 12/07/20 0024 12/07/20 0400 12/07/20 0751  BP:  123/73 122/74 110/77  Pulse:  89 84 100  Resp:  16 20 17   Temp:  98.7 F (37.1 C) 98.6 F (37 C) 98.1 F (36.7 C)  TempSrc: Oral  Oral Oral  SpO2:  100% 99% 100%  Weight:        CBC:  Recent Labs  Lab 12/01/20 1001 12/02/20 0440 12/05/20 0333 12/06/20 0141  WBC 7.2   < > 6.9 6.1  NEUTROABS 5.1  --   --   --   HGB 12.5   < > 11.4* 11.1*  HCT 38.8   < > 34.7* 33.3*  MCV 93.9   < > 92.3 91.5  PLT 191   < > 185 208   < > = values in this interval not displayed.     Basic Metabolic Panel:  Recent Labs  Lab 12/03/20 0423 12/03/20 1634 12/04/20 0629 12/05/20 0333 12/06/20 0141  NA 136  --    < > 135 132*  K 4.2  --    < > 4.2 3.8  CL 103  --    < > 102 101  CO2 26  --    < > 25 22  GLUCOSE 121*  --    < > 105* 117*  BUN 20  --    < > 23 32*  CREATININE 0.87  --    < > 0.81 0.96  CALCIUM 8.7*  --    < > 8.9 8.8*  MG 1.9 1.8  --   --   --   PHOS 3.9 3.4  --   --   --    < > = values in this interval not displayed.     Lipid Panel:  Recent Labs  Lab 12/02/20 0440  CHOL 153  TRIG 81  HDL 48  CHOLHDL 3.2  VLDL 16  LDLCALC 89    HgbA1c:  Lab Results  Component Value Date   HGBA1C 5.7 (H) 12/02/2020   Urine Drug Screen: No results found for: LABOPIA, COCAINSCRNUR, LABBENZ, AMPHETMU, THCU, LABBARB  Alcohol Level No  results found for: [facility]  IMAGING  Results for orders placed or performed during the hospital encounter of 12/01/20  CT HEAD WO CONTRAST (5MM)   Narrative   CLINICAL DATA:  Follow-up after neurovascular intervention.  EXAM: CT HEAD WITHOUT CONTRAST  TECHNIQUE: Contiguous axial images were obtained from the base of the skull through the vertex without intravenous contrast.  COMPARISON:  12/01/2020  FINDINGS: Brain: Allowing for redistribution, unchanged subarachnoid hyperdensity over both hemispheres, left-greater-than-right. A suspected intraparenchymal component in the left parietal lobe is unchanged. There is periventricular hypoattenuation compatible with chronic microvascular disease. Generalized volume loss. No hydrocephalus.  Vascular: No abnormal hyperdensity of the major intracranial arteries or dural venous sinuses. No intracranial atherosclerosis.  Skull: The  visualized skull base, calvarium and extracranial soft tissues are normal.  Sinuses/Orbits: No fluid levels or advanced mucosal thickening of the visualized paranasal sinuses. No mastoid or middle ear effusion. The orbits are normal.  IMPRESSION: 1. Unchanged subarachnoid hyperdensity over both hemispheres, left-greater-than-right, likely a combination contrast extravasation and subarachnoid blood. 2. Unchanged subcortical intraparenchymal blood in the left parietal lobe.   Electronically Signed   By: Ulyses Jarred M.D.   On: 12/02/2020 03:36   Results for orders placed or performed during the hospital encounter of 06/24/20  MR BRAIN WO CONTRAST   Narrative   CLINICAL DATA:  Neuro deficit, acute stroke suspected  EXAM: MRI HEAD WITHOUT CONTRAST  MRA HEAD WITHOUT CONTRAST  TECHNIQUE: Multiplanar, multi-echo pulse sequences of the brain and surrounding structures were acquired without intravenous contrast. Angiographic images of the Circle of Willis were acquired using MRA technique without  intravenous contrast.  COMPARISON:  And a CT code stroke. MRI head June 23, 2019. CTA June 30 2019.  FINDINGS: MRI HEAD FINDINGS  Brain: Punctate focus of restricted diffusion in the high left frontal cortex (series 3, image 38). No significant edema or mass effect. Additional punctate areas of faint DWI signal in bilateral precentral gyri (series 3, images 37 and 39). Redemonstrated multiple remote cortical infarcts in bilateral frontal parietal and occipital lobes. Multiple bilateral cerebellar lacunar infarcts. Moderate scattered patchy confluent T2/FLAIR hyperintensities within the white matter most likely related to chronic microvascular ischemic disease. No hydrocephalus. Age advanced moderate cerebral atrophy with ex vacuo ventricular dilation. No acute hemorrhage. No mass lesion or abnormal mass effect. No extra-axial fluid collection.  Vascular: See below.  Skull and upper cervical spine: Normal marrow signal.  Sinuses/Orbits: Clear sinuses.  Unremarkable orbits.  Other: No sizable mastoid effusions.  MRA HEAD FINDINGS  Anterior circulation: Bilateral ICAs, MCAs, and ACAs are patent without flow-limiting proximal stenosis. No aneurysm identified.  Posterior circulation: The visualized intradural vertebral arteries, basilar artery and posterior cerebral arteries are patent without flow limiting proximal stenosis. No aneurysm identified.  IMPRESSION: MRI:  1. Punctate acute infarct in the high left frontal cortex. Additional faint punctate areas of DWI signal in bilateral precentral gyri are suspicious for additional acute or subacute infarcts, but could potentially represent artifact. Given potential involvement of multiple vascular territories, consider a central embolic etiology. 2. 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 3. Moderate atrophy.  MRA:  No  large vessel occlusion or proximal flow limiting stenosis.   Electronically Signed   By: Margaretha Sheffield MD   On: 06/24/2020 20:15   MR ANGIO HEAD WO CONTRAST   Narrative   CLINICAL DATA:  Neuro deficit, acute stroke suspected  EXAM: MRI HEAD WITHOUT CONTRAST  MRA HEAD WITHOUT CONTRAST  TECHNIQUE: Multiplanar, multi-echo pulse sequences of the brain and surrounding structures were acquired without intravenous contrast. Angiographic images of the Circle of Willis were acquired using MRA technique without intravenous contrast.  COMPARISON:  And a CT code stroke. MRI head June 23, 2019. CTA June 30 2019.  FINDINGS: MRI HEAD FINDINGS  Brain: Punctate focus of restricted diffusion in the high left frontal cortex (series 3, image 38). No significant edema or mass effect. Additional punctate areas of faint DWI signal in bilateral precentral gyri (series 3, images 37 and 39). Redemonstrated multiple remote cortical infarcts in bilateral frontal parietal and occipital lobes. Multiple bilateral cerebellar lacunar infarcts. Moderate scattered patchy confluent T2/FLAIR hyperintensities within the white matter  most likely related to chronic microvascular ischemic disease. No hydrocephalus. Age advanced moderate cerebral atrophy with ex vacuo ventricular dilation. No acute hemorrhage. No mass lesion or abnormal mass effect. No extra-axial fluid collection.  Vascular: See below.  Skull and upper cervical spine: Normal marrow signal.  Sinuses/Orbits: Clear sinuses.  Unremarkable orbits.  Other: No sizable mastoid effusions.  MRA HEAD FINDINGS  Anterior circulation: Bilateral ICAs, MCAs, and ACAs are patent without flow-limiting proximal stenosis. No aneurysm identified.  Posterior circulation: The visualized intradural vertebral arteries, basilar artery and posterior cerebral arteries are patent without flow limiting proximal stenosis. No aneurysm  identified.  IMPRESSION: MRI:  1. Punctate acute infarct in the high left frontal cortex. Additional faint punctate areas of DWI signal in bilateral precentral gyri are suspicious for additional acute or subacute infarcts, but could potentially represent artifact. Given potential involvement of multiple vascular territories, consider a central embolic etiology. 2. 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 3. Moderate atrophy.  MRA:  No large vessel occlusion or proximal flow limiting stenosis.   Electronically Signed   By: Margaretha Sheffield MD   On: 06/24/2020 20:15   Results for orders placed or performed during the hospital encounter of 06/23/19  MR BRAIN W WO CONTRAST   Narrative   [facility]  NEUROIMAGING REPORT   STUDY DATE: 06/23/19 PATIENT NAME: VIRDIA ZIESMER DOB: February 07, 1955 MRN: [id number]  ORDERING CLINICIAN: [name], MD  CLINICAL HISTORY: 65 year old female with numbness.  EXAM: MR BRAIN W WO CONTRAST  TECHNIQUE: MRI of the brain with and without contrast was obtained  utilizing 5 mm axial slices with T1, T2, T2 flair, SWI and diffusion  weighted views.  T1 sagittal, T2 coronal and postcontrast views in the  axial and coronal plane were obtained. CONTRAST: 42ml multihance  COMPARISON: none  IMAGING SITE: [facility] [address] (1.5 Tesla MRI)    FINDINGS:   Scattered bilateral periventricular, subcortical and juxtacortical DWI  hyperintensities in the bilateral cerebral hemispheres.  Some of these are  hypointense on ADC maps.  Moderate periventricular and subcortical and  cortical foci of T2FLAIR hyperintensities, consistent with chronic  ischemic disease.   No abnormal lesions are seen on diffusion-weighted views to suggest acute  ischemia. The cortical sulci, fissures and cisterns are normal in size  and  appearance. Lateral, third and fourth ventricle are normal in size and  appearance. No extra-axial fluid collections are seen. No evidence of mass  effect or midline shift.  No abnormal lesions are seen on post contrast  views.    On sagittal views the posterior fossa, pituitary gland and corpus callosum  are unremarkable. No evidence of intracranial hemorrhage on SWI views. The  orbits and their contents, paranasal sinuses and calvarium are  unremarkable.  Intracranial flow voids are present.     Impression   MRI brain (with and without) demonstrating: -Scattered acute, subacute and chronic ischemic infarcts in the bilateral  cerebral hemispheres. Pattern suggests a proximal / cardioembolic source.  -Moderate chronic small vessel ischemic disease.      INTERPRETING PHYSICIAN:  Penni Bombard, MD Certified in Neurology, Neurophysiology and Neuroimaging  [facility] [address] [place], [place] [address] [phone]        PHYSICAL EXAM Pleasant middle-aged Caucasian lady not in distress. . Afebrile. Head is nontraumatic. Neck is supple without bruit.    Cardiac exam no  murmur or gallop. Lungs are clear to auscultation. Distal pulses are well felt.  Temp:  [98 F (36.7 C)-98.9 F (37.2 C)] 98.1 F (36.7 C) (12/07 0751) Pulse Rate:  [84-100] 100 (12/07 0751) Resp:  [16-20] 17 (12/07 0751) BP: (103-123)/(62-77) 110/77 (12/07 0751) SpO2:  [98 %-100 %] 100 % (12/07 0751)  General:  Patient is an alert, well-developed patient in no acute distress   NEURO:  Mental Status: AA&Ox4 Speech/Language: speech is without dysarthria or aphasia.    Cranial Nerves:  II: PERRL. Visual fields full.  III, IV, VI: EOMI. Eyelids elevate symmetrically.  V: Sensation is intact to light touch and symmetrical to face.  VII: Slight right facial droop present VIII: hearing intact to voice. IX, X: Phonation is normal.  XII: tongue is midline  without fasciculations. Motor: 5/5 strength to LUE, LLE an RLE, 4/5 to RUE Tone: is normal and bulk is normal Sensation- Intact to light touch bilaterally. Extinction absent to light touch to DSS.  Coordination: Slight drift in RUE.  Gait- deferred   ASSESSMENT/PLAN Ms. ADELAYDE MINNEY is a 65 y.o. female with history of anemia, anxiety, depression, two prior strokes, HTN, loop recorder implant in 10/2019, osteoporosis, and thrombocytopenia presenting with acute onset of aphasia and worsened right sided incoordination relative to her baseline deficit. On arrival to the ED the patient was with dense receptive aphasia and mild extensor posturing of her RUE with mild RUE and RLE weakness. Etiology for patient stroke unclear, still consistent with embolic source.  However loop recorder so far negative for A. fib.  Once stable, may consider further cardioembolic work-up with TEE, may consider empiric anticoagulation treatment.  Currently patient has small SAH from procedure, will start aspirin 81 at this time.  Continue home Lipitor 40.  Patient failed swallow, will put on core track and start tube feeding n.p.o. meds.  Given seizure activity overnight, and EEG left spikes, will continue Keppra 500 twice daily.  BP goal less than 160 given SAH postprocedure.  Will obtain TEE before patient is discharged to rehab.  Stroke:  left distal M2/M3 occlusion s/p TNK and IR, complicated by small SAH with TNK reversal with TXA, etiology recurrent cryptogenic stroke with negative prior work-up CT head - Unchanged subarachnoid hyperdensity over both hemispheres, left-greater-than-right, likely a combination contrast extravasation and subarachnoid blood.  Unchanged subcortical intraparenchymal blood in the left parietal lobe. MRI head Scattered small acute infarcts in the left MCA distribution as above. Subarachnoid hemorrhage overlying the bilateral cerebral hemispheres, not significantly changed compared to the  prior CT allowing for difference in modality. The small focus of intraparenchymal hemorrhage in the left parietal lobe is also unchanged.  MRA head  Interval recanalization of the previously occluded left inferior M2 division. No high-grade stenosis or occlusion on the current study. IR - Successful mechanical thrombectomy, small left sylvian subarachnoid hemorrhage vs contrast extravasation on postprocedural flat panel head ct 2D Echo   ejection fraction 55 to 60%.  No cardiac source of embolism. EEG-  evidence of cortical dysfunction and an epileptogenicity arising from left frontotemporal region, likely secondary to underlying structural abnormality.  No seizures  were seen throughout the recording. October 2021 Loop Recorder- negative for Afib Venous Duplex-no evidence of DVT  LDL 89 HgbA1c 5.7 SCDs for VTE prophylaxis aspirin 81 mg daily prior to admission, now on aspirin 325 mg daily. Hold plavix due to [facility] Patient counseled to be compliant with her antithrombotic medications Ongoing aggressive stroke risk factor management Therapy recommendations:  Acute inpatient rehab Disposition:  Pending  Previous Stroke- June 2022 Cryptogenic stroke with loop recorder placement MRI/MRA brain done revealing punctate infarct and high "report except faint areas of DWI signal in bilateral precentral gyri question acute/subacute infarct or artifact.  Stroke felt to be embolic recorder was interrogated with negative for A. Fib   Possible Seizure activity Left facial twitching with altered mental status Ativan overnight EEG done 12/02/2020 showed left spikes Loaded with keppra and continue keppra BID  Hypertension Stable  BP goal < 160 given SAH post procedure  Long-term BP goal normotensive Home medications losartan Cleviprex d/c, managed well with home medications and prns  Hyperlipidemia Home meds:  None LDL 89, goal < 70 Start Lipitor 40mg  Continue statin at discharge  Diabetes type  II HgbA1c 5.7, goal < 7.0 Controlled  Dysphagia SLP consult complete Recommend regular diet with thin liquids Meds whole with liquid Turn off tube feeds and monitor oral intake- d/c cortrak  Other Stroke Risk Factors Advanced age  Other Active Problems Vascular Dementia Altered Mental Status Frequent reorientation Sleep hygiene    Hospital day # 6  Patient seen and examined by NP/APP with MD. MD to update note as needed.   Janine Ores, DNP, FNP-BC [facility] Pager: [phone]  I have personally obtained history,examined this patient, reviewed notes, independently viewed imaging studies, participated in medical decision making and plan of care.ROS completed by me personally and pertinent positives fully documented  I have made any additions or clarifications directly to the above note. Agree with note above.  Patient's TEE has been postponed till tomorrow.  Long discussion with patient and husband at the bedside regarding her recurrent cryptogenic stroke.  They were interested in considering participation in the [place] trial for stroke prevention.  Patient will be screened today to see if he qualified.  Greater than 50% time during this 25-minute visit was spent in counseling and coordination of care about cryptogenic strokes and discussion about stroke prevention treatment and answering questions.  Antony Contras, MD Medical Director [facility] Pager: [phone] 12/07/2020 2:25 PM

## 2020-12-07 NOTE — Progress Notes (Signed)
Speech Language Pathology Treatment: Cognitive-Linquistic  Patient Details Name: Jacqueline Bonilla MRN: [id number] DOB: 01-02-1955 Today's Date: 12/07/2020 Time: 8657-8469 SLP Time Calculation (min) (ACUTE ONLY): 21 min  Assessment / Plan / Recommendation Clinical Impression  Pt was seen for treatment. She was alert and cooperative during the session and she reported that she believes her language is improved and that she is better able to converse, but she was unsure of its proximity to baseline. Pt participated in conversation with intermittent word retrieval difficulty. She demonstrated 75% accuracy with picture description increasing to 100% with cues. She completed 3-step commands with 67% accuracy increasing to 100% with cues. Pt demonstrated auditory comprehension at the paragraph level with 50% accuracy. SLP will continue to follow pt.     HPI HPI: Jacqueline Bonilla is an 65 y.o. 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 On arrival to the ED the patient was with dense receptive aphasia and mild extensor posturing of her RUE with mild RUE and RLE weakness.   MRI showing scattered small acute infarcts in the left MCA distrubution, subarachnoid hemorrhage overlying the bilateral hemispheres not significantly changed compared to CT and hermorrhage in the left parietal that is also unchanged from CT. She has been seen for outpatient speech therapy in March and July of this year to address cognitive deficits.      SLP Plan  Continue with current plan of care      Recommendations for follow  up therapy are one component of a multi-disciplinary discharge planning process, led by the attending physician.  Recommendations may be updated based on patient status, additional functional criteria and insurance authorization.    Recommendations  Diet recommendations: Regular;Thin liquid Liquids provided via: Cup;Straw Medication Administration: Whole meds with liquid Postural Changes and/or Swallow Maneuvers: Seated upright 90 degrees                Follow Up Recommendations: Acute inpatient rehab (3hours/day) Assistance recommended at discharge: Frequent or constant Supervision/Assistance SLP Visit Diagnosis: Aphasia (R47.01) Plan: Continue with current plan of care       Claudett Bayly I. Hardin Negus, [place], [place] Office number [phone] Pager [place]  12/07/2020, 2:07 PM

## 2020-12-07 NOTE — Progress Notes (Signed)
Speech Language Pathology Evaluation  Evaluation completed by Shelly Flatten, MA, CCC-SLP on 12/03/20, but populated by Jacqueline Bonilla, Jacqueline Bonilla, CCC-SLP   12/03/20 0936  SLP Visit Information  SLP Received On 12/01/20  SLP Time Calculation  SLP Start Time (ACUTE ONLY) 0900  SLP Stop Time (ACUTE ONLY) 0910  SLP Time Calculation (min) (ACUTE ONLY) 10 min  Subjective  Subjective The patient was seen sitting upright in bed.  Patient/Family Stated Goal No family present and pt unable to state  General Information  HPI Jacqueline Bonilla is an 65 y.o. 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 On arrival to the ED the patient was with dense receptive aphasia and mild extensor posturing of her RUE with mild RUE and RLE weakness.   MRI showing scattered small acute infarcts in the left MCA distrubution, subarachnoid hemorrhage overlying the bilateral hemispheres not significantly changed compared to CT and hermorrhage in the left parietal that is also unchanged from CT.  Chest xray showing cardiomediastinal silhouette is mildly increased in prominence along the aortic arch, most likely due to a combination of rotation and AP technique. If concern for aortic pathology, recommend  dedicated PA and lateral chest radiograph. 2. Otherwise no acute cardiopulmonary abnormality.  She has been seen for outpatient speech therapy in March and July of this year to address cognitive deficits.  Prior Functional Status  Cognitive/Linguistic Baseline Baseline deficits  Baseline deficit details Pt was seen for OP ST  services to address mild cognitive deficits in March and July of 2022  Type of Home House   Lives With Spouse  Pain Assessment  Pain Assessment No/denies pain  Auditory Comprehension  Overall Auditory Comprehension Impaired  Yes/No Questions X  Basic Biographical Questions 0-25% accurate  Basic Immediate Environment Questions 0-24% accurate  Commands X  One Step Basic Commands 75-100% accurate  Two Step Basic Commands 0-24% accurate  Conversation Simple  Visual Recognition/Discrimination  Discrimination Not tested  Reading Comprehension  Reading Status X  Sentence Level Impaired  Expression  Primary Mode of Expression Verbal  Verbal Expression  Overall Verbal Expression Impaired  Initiation No impairment  Automatic Speech Name;Social Response;Counting  Level of Generative/Spontaneous Verbalization Word;Phrase  Repetition Impaired  Level of Impairment Phrase level  Naming No impairment  Pragmatics No impairment  Non-Verbal Means of Communication Not applicable  Written Expression  Dominant Hand Right  Written Expression Unable to assess (comment)  Motor Speech  Overall Motor Speech Impaired  Respiration WFL  Phonation Low vocal intensity  Articulation Impaired  Level of Impairment Phrase  Intelligibility Intelligible  Motor Planning [facility]  Motor Speech Errors NA  SLP - End of Session  Patient left in bed;with call bell/phone within reach  Nurse Communication Diet recommendation;Swallow strategies reviewed  Assessment  Clinical Impression Statement (ACUTE ONLY) Evaluation was completed using the [place] Aphasia Screening Test (MAST) to assess her current language skills.  Patient at baseline had mild cognitive deficits from previous CVA's and had been seen in OP ST for therapy in March and July of 2022.  She reported that she was working full time and managing her meds/bills at home  prior to this event.  She lives with her husband [name].  Cranial nerve exam was  completed and remarkable for mild droop on the right side of her lower face.  Lingual and jaw range of motion and strength appeared to be adequate.  She did not endorse a difference in sensation from the left to right side of her face but she was unable to fully participate in testing for sensation due to her aphasia.  She presented with a mild dysarthria that was characterized by low vocal volume.  This did not impact intelligibility significantly.  She achieved an overall score on the MAST of 32/100 suggesting severe receptive and expressive aphasia.  Relative strengths were naming objects and following simple one step commands.  Deficits were noted for automatic speech, repetition, yes/no questions, object identification, following 2 step commands, reading/comprehending sentences, writing and verbal fluency.  Her speech was characterized by jargon at times and use of repetive words (ie flock to answer almost all yes/no questions).  Difficulty was seen with increasing complexity.  She was able to repeat single words but not short phrases.  She was able to follow simple 1 step directions but not 2 step commands.  She was able to read/comprehend simple sentences but not more complex sentences (eg "Open your mouth" vs "Point to the floor, then point to the ceiling).  ST will follow during acute state.  She would benefit from intense post acute rehab to address aphasia.  She will require frqeuent/constant supervision at DC currently.  SLP Recommendation/Assessment Patient needs continued [facility]  SLP Visit Diagnosis Aphasia (R47.01)  Problem List Auditory comprehension;Reading comprehension;Written expression;Verbal expression;Vocal quality  Plan  Speech Therapy Frequency (ACUTE ONLY) min 2x/week  Duration 2 weeks  Treatment/Interventions Language facilitation;Cueing hierarchy;Functional tasks;Multimodal communcation approach;SLP instruction and feedback;Compensatory  strategies;Patient/family education  Potential to Achieve Goals (ACUTE ONLY) Good  Potential Considerations (ACUTE ONLY) Severity of impairments  SLP Recommendations  Recommendations for Other Services Rehab consult  Follow Up Recommendations Acute inpatient rehab (3hours/day)  Assistance recommended at discharge Frequent or constant Supervision/Assistance  Functional Status Assessment Patient has had a recent decline in their functional status and demonstrates the ability to make significant improvements in function in a reasonable and predictable amount of time.  SLP Equipment None recommended by SLP  Individuals Consulted  Consulted and Agree with Results and Recommendations Patient;Patient unable/family or caregive not available  SLP Evaluations  $ SLP Speech Visit 1 Visit  SLP Evaluations  $ SLP EVAL LANGUAGE/SOUND PRODUCTION 1 Procedure

## 2020-12-07 NOTE — Progress Notes (Signed)
    [facility] has been requested to perform a transesophageal echocardiogram on [name] for stroke.  After careful review of history and examination, the risks and benefits of transesophageal echocardiogram have been explained including risks of esophageal damage, perforation (1:10,000 risk), bleeding, pharyngeal hematoma as well as other potential complications associated with conscious sedation including aspiration, arrhythmia, respiratory failure and death. Alternatives to treatment were discussed, questions were answered. Patient is willing to proceed.  TEE - Dr. Johnsie Cancel @ 1400.  NPO after midnight. Meds with sips.   Leanor Kail, PA-C 12/07/2020 12:46 PM

## 2020-12-07 NOTE — Progress Notes (Signed)
Inpatient Rehabilitation Admissions Coordinator          I met at bedside with patient and husband to review cost of care for Cir. I await workup completion before proceeding with Cir admit. Note TEE for tomorrow.  Danne Baxter, RN, MSN Rehab Admissions Coordinator [phone] 12/07/2020 11:57 AM

## 2020-12-07 NOTE — Progress Notes (Addendum)
Physical Therapy Treatment Patient Details Name: Jacqueline Bonilla MRN: [id number] DOB: April 30, 1955 Today's Date: 12/07/2020   History of Present Illness 65 y.o. female presents with L weakness and facial droop. 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  MRI found punctate acute infarct in high L frontal cortex; Additional punctate areas of faint DWI signal in bilateral precentral gyri; Multiple remote cortical infarcts in bilateral frontal, parietal, and occipital lobes and multiple bilateral cerebellar lacunar infarcts. PMH includes CVA, vascular dementia, HTN, thrombocytopenia and ILR.    PT Comments    Pt received in supine and agreeable to therapy session. Pt's husband present and supportive throughout session. Pt performed standing foot taps to balance disc with each leading with no LOB and good participation, needing BUE on RW and minA external support for safety. Pt instructed in household distance gait task with emphasis on proximity to RW and scanning environment for safety as she drifts to the R without awareness. Continue to recommend AIR upon DC as she is very motivated to return to prior level of function. Pt continues to benefit from PT services to progress toward functional mobility goals.       Recommendations for follow up therapy are one component of a multi-disciplinary discharge planning process, led by the attending physician.  Recommendations may be updated based on patient status, additional functional criteria and insurance authorization.  Follow Up Recommendations  Acute inpatient rehab (3hours/day)     Assistance Recommended at Discharge Frequent or constant Supervision/Assistance  Equipment Recommendations  Other (comment) (TBD post acute)    Recommendations for Other Services Rehab consult     Precautions / Restrictions Precautions Precautions: Fall;Other (comment) Precaution Comments: apraxic,  ataxic Restrictions Weight Bearing Restrictions: No     Mobility  Bed Mobility Overal bed mobility: Needs Assistance Bed Mobility: Sit to Supine;Supine to Sit    Supine to sit: Min guard Sit to supine: Min assist   General bed mobility comments: Pt with improved initiation today and able to scoot hips to EOB without physical assist, minA for sit>supine for BLE management    Transfers Overall transfer level: Needs assistance Equipment used: Rolling walker (2 wheels) Transfers: Sit to/from Stand Sit to Stand: Min assist   General transfer comment: Cues needed for safe hand placement, scooting hips forward, and nose over toes    Ambulation/Gait Ambulation/Gait assistance: Min assist Gait Distance (Feet): 50 Feet Assistive device: Rolling walker (2 wheels) Gait Pattern/deviations: Step-through pattern;Decreased stride length;Shuffle;Drifts right/left Gait velocity: decreased     General Gait Details: Pt driting to R an unaware, multimodal cues to walk in center of hallway and not drift to R, pt also needed many cues for walker proximity and centering inside walker as she would often turn her body to the R inside the walker     Balance Overall balance assessment: Needs assistance Sitting-balance support: Feet supported;Single extremity supported Sitting balance-Leahy Scale: Fair   Postural control: Right lateral lean Standing balance support: During functional activity;No upper extremity supported Standing balance-Leahy Scale: Fair Standing balance comment: Reliant on external support      Cognition Arousal/Alertness: Awake/alert Behavior During Therapy: WFL for tasks assessed/performed;Impulsive Overall Cognitive Status: Impaired/Different from baseline Area of Impairment: Attention;Following commands;Memory;Safety/judgement;Awareness;Problem solving    Current Attention Level: Selective Memory: Decreased recall of precautions;Decreased short-term memory Following  Commands: Follows one step commands consistently Safety/Judgement: Decreased awareness of safety;Decreased awareness of deficits Awareness: Emergent Problem Solving: Slow processing;Decreased initiation;Difficulty sequencing;Requires verbal cues;Requires tactile cues General  Comments: Follows one step commands well with increased time, needs reminders and further cues throughout session       Exercises Other Exercises Other Exercises: Toe taps to blue balance disc x10 ea       Pertinent Vitals/Pain Pain Assessment: No/denies pain     PT Goals (current goals can now be found in the care plan section) Acute Rehab PT Goals Patient Stated Goal: home PT Goal Formulation: With patient Time For Goal Achievement: 12/18/20 Progress towards PT goals: Progressing toward goals    Frequency    Min 4X/week      PT Plan Current plan remains appropriate       AM-PAC PT "6 Clicks" Mobility   Outcome Measure  Help needed turning from your back to your side while in a flat bed without using bedrails?: A Little Help needed moving from lying on your back to sitting on the side of a flat bed without using bedrails?: A Little Help needed moving to and from a bed to a chair (including a wheelchair)?: A Little Help needed standing up from a chair using your arms (e.g., wheelchair or bedside chair)?: A Little Help needed to walk in hospital room?: A Lot Help needed climbing 3-5 steps with a railing? : Total 6 Click Score: 15    End of Session Equipment Utilized During Treatment: Gait belt Activity Tolerance: Patient tolerated treatment well Patient left: in bed;with bed alarm set;with family/visitor present;Other (comment);with call bell/phone within reach (MD entering room upon therapist exit) Nurse Communication: Mobility status PT Visit Diagnosis: Unsteadiness on feet (R26.81);Other abnormalities of gait and mobility (R26.89);Apraxia (R48.2);Muscle weakness (generalized) (M62.81)      Time: 4268-3419 PT Time Calculation (min) (ACUTE ONLY): 18 min  Charges:  $Gait Training: 8-22 mins                     Evelene Croon, Student PTA CI: Carly P., PTA  Carly M Poff 12/07/2020, 2:16 PM

## 2020-12-07 NOTE — Progress Notes (Signed)
Nutrition Follow-up  DOCUMENTATION CODES:   Non-severe (moderate) malnutrition in context of chronic illness  INTERVENTION:  -Ensure Enlive po BID, each supplement provides 350 kcal and 20 grams of protein -MVI with minerals daily  NUTRITION DIAGNOSIS:   Moderate Malnutrition related to chronic illness (CVA) as evidenced by mild fat depletion, moderate muscle depletion.  ongoing  GOAL:   Patient will meet greater than or equal to 90% of their needs  progressing  MONITOR:   PO intake, Supplement acceptance, Labs, Weight trends, I & O's, Skin  REASON FOR ASSESSMENT:   Consult Enteral/tube feeding initiation and management  ASSESSMENT:   Pt with PMH of anemia, anxiety, depression, CVA x 2 in the past 14 months, HTN, loop recorder 10/21, osteoporosis, and thrombocytopenia admitted with L MCA occlusion.  12/1 s/p CT angio and mechanical thrombectomy; repeat CT shows SAH post IR procedure  12/2 cortrak placed; tip gastric  12/3 diet advanced to regular 12/4 cortrak removed  Pt sleeping at time of RD visit. Per RN, pt w/o any issues/complaints at this time. Neurology following and noted neuro exam remains unchanged and pt's vitals are stable. Plan for pt to have TEE tomorrow with likely d/c to CIR after.   PO Intake: 45-100% x 2 recorded meals   UOP: 2x unmeasured occurrences x24 hours I/O: -3094ml since admit  Current weight: 54.4 kg Admit weight: 56.8 kg   Medications:   stroke: mapping our early stages of recovery book   Does not apply Once   aspirin  81 mg Oral Daily   atorvastatin  40 mg Oral Daily   bethanechol  5 mg Oral TID   Chlorhexidine Gluconate Cloth  6 each Topical Daily   cholecalciferol  1,000 Units Oral Daily   levETIRAcetam  500 mg Oral BID   losartan  25 mg Oral Daily   pantoprazole  40 mg Oral Daily   tamsulosin  0.4 mg Oral QPC supper   thiamine  100 mg Oral Daily   ticagrelor  90 mg Oral BID   Labs: Recent Labs  Lab 12/02/20 1700  12/03/20 0423 12/03/20 1634 12/04/20 0629 12/05/20 0333 12/06/20 0141  NA  --  136  --  133* 135 132*  K  --  4.2  --  3.8 4.2 3.8  CL  --  103  --  100 102 101  CO2  --  26  --  24 25 22   BUN  --  20  --  19 23 32*  CREATININE  --  0.87  --  0.75 0.81 0.96  CALCIUM  --  8.7*  --  8.6* 8.9 8.8*  MG 1.9 1.9 1.8  --   --   --   PHOS 3.4 3.9 3.4  --   --   --   GLUCOSE  --  121*  --  114* 105* 117*     Diet Order:   Diet Order             Diet regular Room service appropriate? Yes; Fluid consistency: Thin  Diet effective now                   EDUCATION NEEDS:   Not appropriate for education at this time  Skin:  Skin Assessment: Reviewed RN Assessment  Last BM:  12/6 type 5  Height:   Ht Readings from Last 1 Encounters:  11/17/20 5\' 4"  (1.626 m)    Weight:   Wt Readings from Last 1 Encounters:  12/05/20 54.4 kg   BMI:  Body mass index is 20.59 kg/m.  Estimated Nutritional Needs:   Kcal:  1600-1800  Protein:  75-90 grams  Fluid:  > 1.6 L/day     Theone Stanley., MS, RD, LDN (she/her/hers) RD pager number and weekend/on-call pager number located in [place].

## 2020-12-07 NOTE — Progress Notes (Signed)
Became very upset and started yelling at this nurse because said she does not understand why I have to stay in the restroom with her.  I explained that she almost fell and did not even attempt to catch self when sitting on the toilet and was not even close to the toilet before starting to sit.  She said that she is a grown woman and does not see why she has to have an audience to use the toilet.  I did explain that after she was seated I could step out but had to remain within arms reach at all times to prevent her from falling.  She said that was very annoying to her.  I explained that safety trumps privacy but that I would try my best to maintain her privacy once she was seated.  She said she is just very annoyed because of all of the beeping noises and loudness in the halls and the fact that the door cannot be totally closed.  I reminded her that 3 times since I have been her nurse she did attempt to get out of bed unassisted and that she is a high fall risk and could get hurt and that, per policy, if she is a high fall risk, and she does have some confusion, that I had to keep the door open but I could have to try to control the noise.  She just turned her head and said nothing more.

## 2020-12-07 NOTE — Plan of Care (Signed)
  Problem: Coping: Goal: Will verbalize positive feelings about self Outcome: Progressing Goal: Will identify appropriate support needs Outcome: Progressing   Problem: Health Behavior/Discharge Planning: Goal: Ability to manage health-related needs will improve Outcome: Progressing   Problem: Self-Care: Goal: Ability to participate in self-care as condition permits will improve Outcome: Progressing Goal: Verbalization of feelings and concerns over difficulty with self-care will improve Outcome: Progressing

## 2020-12-07 NOTE — H&P (View-Only) (Signed)
None STROKE TEAM PROGRESS NOTE   SUBJECTIVE (INTERVAL HISTORY) Patient is resting comfortably in bed with her husband at the bedside.  She had no acute events overnight and her vital signs remain stable her neuro exam remains unchanged.  Her TEE was moved to tomorrow at 2 PM and plan for discharge to inpatient rehab after that.  Patient should be n.p.o. at midnight for procedure.  We did discuss using Brilinta and aspirin for 30 days.  Serum sodium is slightly low at 132 and will trend.  CBC is normal.  Vital signs stable.  Neurological exam is improving.  Eschar OBJECTIVE Vitals:   12/06/20 1653 12/07/20 0024 12/07/20 0400 12/07/20 0751  BP:  123/73 122/74 110/77  Pulse:  89 84 100  Resp:  16 20 17   Temp:  98.7 F (37.1 C) 98.6 F (37 C) 98.1 F (36.7 C)  TempSrc: Oral  Oral Oral  SpO2:  100% 99% 100%  Weight:        CBC:  Recent Labs  Lab 12/01/20 1001 12/02/20 0440 12/05/20 0333 12/06/20 0141  WBC 7.2   < > 6.9 6.1  NEUTROABS 5.1  --   --   --   HGB 12.5   < > 11.4* 11.1*  HCT 38.8   < > 34.7* 33.3*  MCV 93.9   < > 92.3 91.5  PLT 191   < > 185 208   < > = values in this interval not displayed.     Basic Metabolic Panel:  Recent Labs  Lab 12/03/20 0423 12/03/20 1634 12/04/20 0629 12/05/20 0333 12/06/20 0141  NA 136  --    < > 135 132*  K 4.2  --    < > 4.2 3.8  CL 103  --    < > 102 101  CO2 26  --    < > 25 22  GLUCOSE 121*  --    < > 105* 117*  BUN 20  --    < > 23 32*  CREATININE 0.87  --    < > 0.81 0.96  CALCIUM 8.7*  --    < > 8.9 8.8*  MG 1.9 1.8  --   --   --   PHOS 3.9 3.4  --   --   --    < > = values in this interval not displayed.     Lipid Panel:  Recent Labs  Lab 12/02/20 0440  CHOL 153  TRIG 81  HDL 48  CHOLHDL 3.2  VLDL 16  LDLCALC 89    HgbA1c:  Lab Results  Component Value Date   HGBA1C 5.7 (H) 12/02/2020   Urine Drug Screen: No results found for: LABOPIA, COCAINSCRNUR, LABBENZ, AMPHETMU, THCU, LABBARB  Alcohol Level No  results found for: [facility]  IMAGING  Results for orders placed or performed during the hospital encounter of 12/01/20  CT HEAD WO CONTRAST (5MM)   Narrative   CLINICAL DATA:  Follow-up after neurovascular intervention.  EXAM: CT HEAD WITHOUT CONTRAST  TECHNIQUE: Contiguous axial images were obtained from the base of the skull through the vertex without intravenous contrast.  COMPARISON:  12/01/2020  FINDINGS: Brain: Allowing for redistribution, unchanged subarachnoid hyperdensity over both hemispheres, left-greater-than-right. A suspected intraparenchymal component in the left parietal lobe is unchanged. There is periventricular hypoattenuation compatible with chronic microvascular disease. Generalized volume loss. No hydrocephalus.  Vascular: No abnormal hyperdensity of the major intracranial arteries or dural venous sinuses. No intracranial atherosclerosis.  Skull: The  visualized skull base, calvarium and extracranial soft tissues are normal.  Sinuses/Orbits: No fluid levels or advanced mucosal thickening of the visualized paranasal sinuses. No mastoid or middle ear effusion. The orbits are normal.  IMPRESSION: 1. Unchanged subarachnoid hyperdensity over both hemispheres, left-greater-than-right, likely a combination contrast extravasation and subarachnoid blood. 2. Unchanged subcortical intraparenchymal blood in the left parietal lobe.   Electronically Signed   By: Ulyses Jarred M.D.   On: 12/02/2020 03:36   Results for orders placed or performed during the hospital encounter of 06/24/20  MR BRAIN WO CONTRAST   Narrative   CLINICAL DATA:  Neuro deficit, acute stroke suspected  EXAM: MRI HEAD WITHOUT CONTRAST  MRA HEAD WITHOUT CONTRAST  TECHNIQUE: Multiplanar, multi-echo pulse sequences of the brain and surrounding structures were acquired without intravenous contrast. Angiographic images of the Circle of Willis were acquired using MRA technique without  intravenous contrast.  COMPARISON:  And a CT code stroke. MRI head June 23, 2019. CTA June 30 2019.  FINDINGS: MRI HEAD FINDINGS  Brain: Punctate focus of restricted diffusion in the high left frontal cortex (series 3, image 38). No significant edema or mass effect. Additional punctate areas of faint DWI signal in bilateral precentral gyri (series 3, images 37 and 39). Redemonstrated multiple remote cortical infarcts in bilateral frontal parietal and occipital lobes. Multiple bilateral cerebellar lacunar infarcts. Moderate scattered patchy confluent T2/FLAIR hyperintensities within the white matter most likely related to chronic microvascular ischemic disease. No hydrocephalus. Age advanced moderate cerebral atrophy with ex vacuo ventricular dilation. No acute hemorrhage. No mass lesion or abnormal mass effect. No extra-axial fluid collection.  Vascular: See below.  Skull and upper cervical spine: Normal marrow signal.  Sinuses/Orbits: Clear sinuses.  Unremarkable orbits.  Other: No sizable mastoid effusions.  MRA HEAD FINDINGS  Anterior circulation: Bilateral ICAs, MCAs, and ACAs are patent without flow-limiting proximal stenosis. No aneurysm identified.  Posterior circulation: The visualized intradural vertebral arteries, basilar artery and posterior cerebral arteries are patent without flow limiting proximal stenosis. No aneurysm identified.  IMPRESSION: MRI:  1. Punctate acute infarct in the high left frontal cortex. Additional faint punctate areas of DWI signal in bilateral precentral gyri are suspicious for additional acute or subacute infarcts, but could potentially represent artifact. Given potential involvement of multiple vascular territories, consider a central embolic etiology. 2. 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 3. Moderate atrophy.  MRA:  No  large vessel occlusion or proximal flow limiting stenosis.   Electronically Signed   By: Margaretha Sheffield MD   On: 06/24/2020 20:15   MR ANGIO HEAD WO CONTRAST   Narrative   CLINICAL DATA:  Neuro deficit, acute stroke suspected  EXAM: MRI HEAD WITHOUT CONTRAST  MRA HEAD WITHOUT CONTRAST  TECHNIQUE: Multiplanar, multi-echo pulse sequences of the brain and surrounding structures were acquired without intravenous contrast. Angiographic images of the Circle of Willis were acquired using MRA technique without intravenous contrast.  COMPARISON:  And a CT code stroke. MRI head June 23, 2019. CTA June 30 2019.  FINDINGS: MRI HEAD FINDINGS  Brain: Punctate focus of restricted diffusion in the high left frontal cortex (series 3, image 38). No significant edema or mass effect. Additional punctate areas of faint DWI signal in bilateral precentral gyri (series 3, images 37 and 39). Redemonstrated multiple remote cortical infarcts in bilateral frontal parietal and occipital lobes. Multiple bilateral cerebellar lacunar infarcts. Moderate scattered patchy confluent T2/FLAIR hyperintensities within the white matter  most likely related to chronic microvascular ischemic disease. No hydrocephalus. Age advanced moderate cerebral atrophy with ex vacuo ventricular dilation. No acute hemorrhage. No mass lesion or abnormal mass effect. No extra-axial fluid collection.  Vascular: See below.  Skull and upper cervical spine: Normal marrow signal.  Sinuses/Orbits: Clear sinuses.  Unremarkable orbits.  Other: No sizable mastoid effusions.  MRA HEAD FINDINGS  Anterior circulation: Bilateral ICAs, MCAs, and ACAs are patent without flow-limiting proximal stenosis. No aneurysm identified.  Posterior circulation: The visualized intradural vertebral arteries, basilar artery and posterior cerebral arteries are patent without flow limiting proximal stenosis. No aneurysm  identified.  IMPRESSION: MRI:  1. Punctate acute infarct in the high left frontal cortex. Additional faint punctate areas of DWI signal in bilateral precentral gyri are suspicious for additional acute or subacute infarcts, but could potentially represent artifact. Given potential involvement of multiple vascular territories, consider a central embolic etiology. 2. 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 3. Moderate atrophy.  MRA:  No large vessel occlusion or proximal flow limiting stenosis.   Electronically Signed   By: Margaretha Sheffield MD   On: 06/24/2020 20:15   Results for orders placed or performed during the hospital encounter of 06/23/19  MR BRAIN W WO CONTRAST   Narrative   [facility]  NEUROIMAGING REPORT   STUDY DATE: 06/23/19 PATIENT NAME: PAIZLEY RAMELLA DOB: 1955-04-08 MRN: [id number]  ORDERING CLINICIAN: [name], MD  CLINICAL HISTORY: 65 year old female with numbness.  EXAM: MR BRAIN W WO CONTRAST  TECHNIQUE: MRI of the brain with and without contrast was obtained  utilizing 5 mm axial slices with T1, T2, T2 flair, SWI and diffusion  weighted views.  T1 sagittal, T2 coronal and postcontrast views in the  axial and coronal plane were obtained. CONTRAST: 50ml multihance  COMPARISON: none  IMAGING SITE: [facility] [address] (1.5 Tesla MRI)    FINDINGS:   Scattered bilateral periventricular, subcortical and juxtacortical DWI  hyperintensities in the bilateral cerebral hemispheres.  Some of these are  hypointense on ADC maps.  Moderate periventricular and subcortical and  cortical foci of T2FLAIR hyperintensities, consistent with chronic  ischemic disease.   No abnormal lesions are seen on diffusion-weighted views to suggest acute  ischemia. The cortical sulci, fissures and cisterns are normal in size  and  appearance. Lateral, third and fourth ventricle are normal in size and  appearance. No extra-axial fluid collections are seen. No evidence of mass  effect or midline shift.  No abnormal lesions are seen on post contrast  views.    On sagittal views the posterior fossa, pituitary gland and corpus callosum  are unremarkable. No evidence of intracranial hemorrhage on SWI views. The  orbits and their contents, paranasal sinuses and calvarium are  unremarkable.  Intracranial flow voids are present.     Impression   MRI brain (with and without) demonstrating: -Scattered acute, subacute and chronic ischemic infarcts in the bilateral  cerebral hemispheres. Pattern suggests a proximal / cardioembolic source.  -Moderate chronic small vessel ischemic disease.      INTERPRETING PHYSICIAN:  Penni Bombard, MD Certified in Neurology, Neurophysiology and Neuroimaging  [facility] [address] [place], [place] [address] [phone]        PHYSICAL EXAM Pleasant middle-aged Caucasian lady not in distress. . Afebrile. Head is nontraumatic. Neck is supple without bruit.    Cardiac exam no  murmur or gallop. Lungs are clear to auscultation. Distal pulses are well felt.  Temp:  [98 F (36.7 C)-98.9 F (37.2 C)] 98.1 F (36.7 C) (12/07 0751) Pulse Rate:  [84-100] 100 (12/07 0751) Resp:  [16-20] 17 (12/07 0751) BP: (103-123)/(62-77) 110/77 (12/07 0751) SpO2:  [98 %-100 %] 100 % (12/07 0751)  General:  Patient is an alert, well-developed patient in no acute distress   NEURO:  Mental Status: AA&Ox4 Speech/Language: speech is without dysarthria or aphasia.    Cranial Nerves:  II: PERRL. Visual fields full.  III, IV, VI: EOMI. Eyelids elevate symmetrically.  V: Sensation is intact to light touch and symmetrical to face.  VII: Slight right facial droop present VIII: hearing intact to voice. IX, X: Phonation is normal.  XII: tongue is midline  without fasciculations. Motor: 5/5 strength to LUE, LLE an RLE, 4/5 to RUE Tone: is normal and bulk is normal Sensation- Intact to light touch bilaterally. Extinction absent to light touch to DSS.  Coordination: Slight drift in RUE.  Gait- deferred   ASSESSMENT/PLAN Jacqueline Bonilla is a 65 y.o. female with history of anemia, anxiety, depression, two prior strokes, HTN, loop recorder implant in 10/2019, osteoporosis, and thrombocytopenia presenting with acute onset of aphasia and worsened right sided incoordination relative to her baseline deficit. On arrival to the ED the patient was with dense receptive aphasia and mild extensor posturing of her RUE with mild RUE and RLE weakness. Etiology for patient stroke unclear, still consistent with embolic source.  However loop recorder so far negative for A. fib.  Once stable, may consider further cardioembolic work-up with TEE, may consider empiric anticoagulation treatment.  Currently patient has small SAH from procedure, will start aspirin 81 at this time.  Continue home Lipitor 40.  Patient failed swallow, will put on core track and start tube feeding n.p.o. meds.  Given seizure activity overnight, and EEG left spikes, will continue Keppra 500 twice daily.  BP goal less than 160 given SAH postprocedure.  Will obtain TEE before patient is discharged to rehab.  Stroke:  left distal M2/M3 occlusion s/p TNK and IR, complicated by small SAH with TNK reversal with TXA, etiology recurrent cryptogenic stroke with negative prior work-up CT head - Unchanged subarachnoid hyperdensity over both hemispheres, left-greater-than-right, likely a combination contrast extravasation and subarachnoid blood.  Unchanged subcortical intraparenchymal blood in the left parietal lobe. MRI head Scattered small acute infarcts in the left MCA distribution as above. Subarachnoid hemorrhage overlying the bilateral cerebral hemispheres, not significantly changed compared to the  prior CT allowing for difference in modality. The small focus of intraparenchymal hemorrhage in the left parietal lobe is also unchanged.  MRA head  Interval recanalization of the previously occluded left inferior M2 division. No high-grade stenosis or occlusion on the current study. IR - Successful mechanical thrombectomy, small left sylvian subarachnoid hemorrhage vs contrast extravasation on postprocedural flat panel head ct 2D Echo   ejection fraction 55 to 60%.  No cardiac source of embolism. EEG-  evidence of cortical dysfunction and an epileptogenicity arising from left frontotemporal region, likely secondary to underlying structural abnormality.  No seizures  were seen throughout the recording. October 2021 Loop Recorder- negative for Afib Venous Duplex-no evidence of DVT  LDL 89 HgbA1c 5.7 SCDs for VTE prophylaxis aspirin 81 mg daily prior to admission, now on aspirin 325 mg daily. Hold plavix due to [facility] Patient counseled to be compliant with her antithrombotic medications Ongoing aggressive stroke risk factor management Therapy recommendations:  Acute inpatient rehab Disposition:  Pending  Previous Stroke- June 2022 Cryptogenic stroke with loop recorder placement MRI/MRA brain done revealing punctate infarct and high "report except faint areas of DWI signal in bilateral precentral gyri question acute/subacute infarct or artifact.  Stroke felt to be embolic recorder was interrogated with negative for A. Fib   Possible Seizure activity Left facial twitching with altered mental status Ativan overnight EEG done 12/02/2020 showed left spikes Loaded with keppra and continue keppra BID  Hypertension Stable  BP goal < 160 given SAH post procedure  Long-term BP goal normotensive Home medications losartan Cleviprex d/c, managed well with home medications and prns  Hyperlipidemia Home meds:  None LDL 89, goal < 70 Start Lipitor 40mg  Continue statin at discharge  Diabetes type  II HgbA1c 5.7, goal < 7.0 Controlled  Dysphagia SLP consult complete Recommend regular diet with thin liquids Meds whole with liquid Turn off tube feeds and monitor oral intake- d/c cortrak  Other Stroke Risk Factors Advanced age  Other Active Problems Vascular Dementia Altered Mental Status Frequent reorientation Sleep hygiene    Hospital day # 6  Patient seen and examined by NP/APP with MD. MD to update note as needed.   Janine Ores, DNP, FNP-BC [facility] Pager: [phone]  I have personally obtained history,examined this patient, reviewed notes, independently viewed imaging studies, participated in medical decision making and plan of care.ROS completed by me personally and pertinent positives fully documented  I have made any additions or clarifications directly to the above note. Agree with note above.  Patient's TEE has been postponed till tomorrow.  Long discussion with patient and husband at the bedside regarding her recurrent cryptogenic stroke.  They were interested in considering participation in the [place] trial for stroke prevention.  Patient will be screened today to see if he qualified.  Greater than 50% time during this 25-minute visit was spent in counseling and coordination of care about cryptogenic strokes and discussion about stroke prevention treatment and answering questions.  Antony Contras, MD Medical Director [facility] Pager: [phone] 12/07/2020 2:25 PM

## 2020-12-07 NOTE — Progress Notes (Signed)
PHARMACIST - PHYSICIAN COMMUNICATION  DR:   Leonie Man  CONCERNING: IV to Oral Route Change Policy  RECOMMENDATION: This patient is receiving Keppra by the intravenous route.  Based on criteria approved by the Pharmacy and Therapeutics Committee, the intravenous medication(s) is/are being converted to the equivalent oral dose form(s).   DESCRIPTION: These criteria include: The patient is eating (either orally or via tube) and/or has been taking other orally administered medications for a least 24 hours The patient has no evidence of active gastrointestinal bleeding or impaired GI absorption (gastrectomy, short bowel, patient on TNA or NPO).  If you have questions about this conversion, please contact the Pharmacy Department  []   [phone] )  [facility] []   [phone] )  [facility] [x]   [phone] )  [facility] []   [phone] )  [facility] []   [phone] )  [facility]

## 2020-12-08 ENCOUNTER — Inpatient Hospital Stay (HOSPITAL_COMMUNITY): Payer: Medicare HMO | Admitting: Anesthesiology

## 2020-12-08 ENCOUNTER — Encounter (HOSPITAL_COMMUNITY): Payer: Self-pay | Admitting: Physical Medicine & Rehabilitation

## 2020-12-08 ENCOUNTER — Other Ambulatory Visit: Payer: Self-pay

## 2020-12-08 ENCOUNTER — Encounter (HOSPITAL_COMMUNITY)
Admission: EM | Disposition: A | Discharge status: Rehabilitation Facility | Payer: Self-pay | Source: Home / Self Care | Attending: Neurology

## 2020-12-08 ENCOUNTER — Encounter (HOSPITAL_COMMUNITY): Payer: Self-pay | Admitting: Neurology

## 2020-12-08 ENCOUNTER — Inpatient Hospital Stay (HOSPITAL_COMMUNITY): Payer: Medicare HMO

## 2020-12-08 ENCOUNTER — Inpatient Hospital Stay (HOSPITAL_COMMUNITY)
Admission: RE | Admit: 2020-12-08 | Discharge: 2020-12-17 | DRG: 057 | Disposition: A | Discharge status: Home / Self Care | Payer: Medicare HMO | Source: Intra-hospital | Attending: Physical Medicine & Rehabilitation | Admitting: Physical Medicine & Rehabilitation

## 2020-12-08 DIAGNOSIS — I69398 Other sequelae of cerebral infarction: Secondary | ICD-10-CM

## 2020-12-08 DIAGNOSIS — M810 Age-related osteoporosis without current pathological fracture: Secondary | ICD-10-CM | POA: Diagnosis present

## 2020-12-08 DIAGNOSIS — F0150 Vascular dementia without behavioral disturbance: Secondary | ICD-10-CM | POA: Diagnosis not present

## 2020-12-08 DIAGNOSIS — Z91013 Allergy to seafood: Secondary | ICD-10-CM | POA: Diagnosis not present

## 2020-12-08 DIAGNOSIS — I69020 Aphasia following nontraumatic subarachnoid hemorrhage: Secondary | ICD-10-CM

## 2020-12-08 DIAGNOSIS — R339 Retention of urine, unspecified: Secondary | ICD-10-CM | POA: Diagnosis present

## 2020-12-08 DIAGNOSIS — I69354 Hemiplegia and hemiparesis following cerebral infarction affecting left non-dominant side: Principal | ICD-10-CM

## 2020-12-08 DIAGNOSIS — I69392 Facial weakness following cerebral infarction: Secondary | ICD-10-CM

## 2020-12-08 DIAGNOSIS — Z807 Family history of other malignant neoplasms of lymphoid, hematopoietic and related tissues: Secondary | ICD-10-CM

## 2020-12-08 DIAGNOSIS — N3289 Other specified disorders of bladder: Secondary | ICD-10-CM | POA: Diagnosis present

## 2020-12-08 DIAGNOSIS — D649 Anemia, unspecified: Secondary | ICD-10-CM | POA: Diagnosis not present

## 2020-12-08 DIAGNOSIS — F419 Anxiety disorder, unspecified: Secondary | ICD-10-CM | POA: Diagnosis present

## 2020-12-08 DIAGNOSIS — Z7902 Long term (current) use of antithrombotics/antiplatelets: Secondary | ICD-10-CM

## 2020-12-08 DIAGNOSIS — R579 Shock, unspecified: Secondary | ICD-10-CM | POA: Diagnosis not present

## 2020-12-08 DIAGNOSIS — W19XXXD Unspecified fall, subsequent encounter: Secondary | ICD-10-CM | POA: Diagnosis present

## 2020-12-08 DIAGNOSIS — I69320 Aphasia following cerebral infarction: Secondary | ICD-10-CM

## 2020-12-08 DIAGNOSIS — N809 Endometriosis, unspecified: Secondary | ICD-10-CM | POA: Diagnosis present

## 2020-12-08 DIAGNOSIS — E785 Hyperlipidemia, unspecified: Secondary | ICD-10-CM | POA: Diagnosis present

## 2020-12-08 DIAGNOSIS — R279 Unspecified lack of coordination: Secondary | ICD-10-CM | POA: Diagnosis present

## 2020-12-08 DIAGNOSIS — Z79899 Other long term (current) drug therapy: Secondary | ICD-10-CM | POA: Diagnosis not present

## 2020-12-08 DIAGNOSIS — Z881 Allergy status to other antibiotic agents status: Secondary | ICD-10-CM

## 2020-12-08 DIAGNOSIS — F32A Depression, unspecified: Secondary | ICD-10-CM | POA: Diagnosis present

## 2020-12-08 DIAGNOSIS — Z888 Allergy status to other drugs, medicaments and biological substances status: Secondary | ICD-10-CM | POA: Diagnosis not present

## 2020-12-08 DIAGNOSIS — D696 Thrombocytopenia, unspecified: Secondary | ICD-10-CM | POA: Diagnosis present

## 2020-12-08 DIAGNOSIS — I63512 Cerebral infarction due to unspecified occlusion or stenosis of left middle cerebral artery: Secondary | ICD-10-CM | POA: Diagnosis not present

## 2020-12-08 DIAGNOSIS — Z7982 Long term (current) use of aspirin: Secondary | ICD-10-CM

## 2020-12-08 DIAGNOSIS — I69054 Hemiplegia and hemiparesis following nontraumatic subarachnoid hemorrhage affecting left non-dominant side: Secondary | ICD-10-CM

## 2020-12-08 DIAGNOSIS — I10 Essential (primary) hypertension: Secondary | ICD-10-CM | POA: Diagnosis not present

## 2020-12-08 DIAGNOSIS — M25552 Pain in left hip: Secondary | ICD-10-CM | POA: Diagnosis present

## 2020-12-08 DIAGNOSIS — I63039 Cerebral infarction due to thrombosis of unspecified carotid artery: Secondary | ICD-10-CM | POA: Diagnosis not present

## 2020-12-08 DIAGNOSIS — I340 Nonrheumatic mitral (valve) insufficiency: Secondary | ICD-10-CM | POA: Diagnosis not present

## 2020-12-08 DIAGNOSIS — M545 Low back pain, unspecified: Secondary | ICD-10-CM | POA: Diagnosis present

## 2020-12-08 DIAGNOSIS — Z8249 Family history of ischemic heart disease and other diseases of the circulatory system: Secondary | ICD-10-CM | POA: Diagnosis not present

## 2020-12-08 DIAGNOSIS — M25551 Pain in right hip: Secondary | ICD-10-CM | POA: Diagnosis present

## 2020-12-08 DIAGNOSIS — G4730 Sleep apnea, unspecified: Secondary | ICD-10-CM | POA: Diagnosis present

## 2020-12-08 HISTORY — PX: BUBBLE STUDY: SHX6837

## 2020-12-08 HISTORY — PX: TEE WITHOUT CARDIOVERSION: SHX5443

## 2020-12-08 SURGERY — ECHOCARDIOGRAM, TRANSESOPHAGEAL
Anesthesia: Monitor Anesthesia Care

## 2020-12-08 MED ORDER — ACETAMINOPHEN 650 MG RE SUPP: 650.0000 mg | RECTAL | Status: DC | PRN

## 2020-12-08 MED ORDER — ACETAMINOPHEN 325 MG PO TABS
650.0000 mg | ORAL_TABLET | ORAL | Status: DC | PRN
  Administered 2020-12-10 – 2020-12-15 (×6): 650 mg via ORAL
  Filled 2020-12-08 (×6): qty 2

## 2020-12-08 MED ORDER — ACETAMINOPHEN 160 MG/5ML PO SOLN: 650.0000 mg | ORAL | Status: DC | PRN

## 2020-12-08 MED ORDER — ASPIRIN 81 MG PO CHEW
81.0000 mg | CHEWABLE_TABLET | Freq: Every day | ORAL | Status: DC
  Administered 2020-12-08 – 2020-12-17 (×10): 81 mg via ORAL
  Filled 2020-12-08 (×10): qty 1

## 2020-12-08 MED ORDER — THIAMINE HCL 100 MG PO TABS
100.0000 mg | ORAL_TABLET | Freq: Every day | ORAL | Status: DC
  Administered 2020-12-08 – 2020-12-17 (×10): 100 mg via ORAL
  Filled 2020-12-08 (×11): qty 1

## 2020-12-08 MED ORDER — ATORVASTATIN CALCIUM 40 MG PO TABS
40.0000 mg | ORAL_TABLET | Freq: Every day | ORAL | Status: DC
  Administered 2020-12-08 – 2020-12-17 (×10): 40 mg via ORAL
  Filled 2020-12-08 (×10): qty 1

## 2020-12-08 MED ORDER — LIDOCAINE 2% (20 MG/ML) 5 ML SYRINGE
INTRAMUSCULAR | Status: DC | PRN
  Administered 2020-12-08: 60 mg via INTRAVENOUS

## 2020-12-08 MED ORDER — PANTOPRAZOLE SODIUM 40 MG PO TBEC
40.0000 mg | DELAYED_RELEASE_TABLET | Freq: Every day | ORAL | Status: DC
  Administered 2020-12-08 – 2020-12-17 (×8): 40 mg via ORAL
  Filled 2020-12-08 (×10): qty 1

## 2020-12-08 MED ORDER — BUTAMBEN-TETRACAINE-BENZOCAINE 2-2-14 % EX AERO
INHALATION_SPRAY | CUTANEOUS | Status: DC | PRN
  Administered 2020-12-08: 2 via TOPICAL

## 2020-12-08 MED ORDER — PROPOFOL 10 MG/ML IV BOLUS
INTRAVENOUS | Status: DC | PRN
  Administered 2020-12-08: 15 mg via INTRAVENOUS
  Administered 2020-12-08: 25 mg via INTRAVENOUS
  Administered 2020-12-08: 20 mg via INTRAVENOUS

## 2020-12-08 MED ORDER — LOSARTAN POTASSIUM 50 MG PO TABS
25.0000 mg | ORAL_TABLET | Freq: Every day | ORAL | Status: DC
  Administered 2020-12-08 – 2020-12-09 (×2): 25 mg via ORAL
  Filled 2020-12-08 (×2): qty 1

## 2020-12-08 MED ORDER — TAMSULOSIN HCL 0.4 MG PO CAPS
0.4000 mg | ORAL_CAPSULE | Freq: Every day | ORAL | Status: DC
  Administered 2020-12-08 – 2020-12-10 (×3): 0.4 mg via ORAL
  Filled 2020-12-08 (×4): qty 1

## 2020-12-08 MED ORDER — LEVETIRACETAM 500 MG PO TABS
500.0000 mg | ORAL_TABLET | Freq: Two times a day (BID) | ORAL | Status: DC
  Administered 2020-12-08 – 2020-12-17 (×18): 500 mg via ORAL
  Filled 2020-12-08 (×18): qty 1

## 2020-12-08 MED ORDER — PROPOFOL 500 MG/50ML IV EMUL
INTRAVENOUS | Status: DC | PRN
  Administered 2020-12-08: 100 ug/kg/min via INTRAVENOUS

## 2020-12-08 MED ORDER — BETHANECHOL CHLORIDE 10 MG PO TABS
5.0000 mg | ORAL_TABLET | Freq: Three times a day (TID) | ORAL | Status: DC
  Administered 2020-12-08 – 2020-12-11 (×11): 5 mg via ORAL
  Filled 2020-12-08 (×12): qty 1

## 2020-12-08 MED ORDER — SENNOSIDES-DOCUSATE SODIUM 8.6-50 MG PO TABS: 1.0000 | ORAL_TABLET | Freq: Every evening | ORAL | Status: DC | PRN

## 2020-12-08 MED ORDER — ENSURE ENLIVE PO LIQD: 237.0000 mL | Freq: Two times a day (BID) | ORAL | Status: DC

## 2020-12-08 MED ORDER — ADULT MULTIVITAMIN W/MINERALS CH
1.0000 | ORAL_TABLET | Freq: Every day | ORAL | Status: DC
  Administered 2020-12-08 – 2020-12-17 (×10): 1 via ORAL
  Filled 2020-12-08 (×10): qty 1

## 2020-12-08 MED ORDER — VITAMIN D 25 MCG (1000 UNIT) PO TABS
1000.0000 [IU] | ORAL_TABLET | Freq: Every day | ORAL | Status: DC
  Administered 2020-12-08 – 2020-12-17 (×10): 1000 [IU] via ORAL
  Filled 2020-12-08 (×10): qty 1

## 2020-12-08 MED ORDER — SODIUM CHLORIDE 0.9 % IV SOLN: INTRAVENOUS | Status: DC

## 2020-12-08 MED ORDER — TICAGRELOR 90 MG PO TABS
90.0000 mg | ORAL_TABLET | Freq: Two times a day (BID) | ORAL | Status: DC
  Administered 2020-12-08 – 2020-12-17 (×18): 90 mg via ORAL
  Filled 2020-12-08 (×18): qty 1

## 2020-12-08 NOTE — TOC Transition Note (Signed)
Transition of Care [facility]) - CM/SW Discharge Note   Patient Details  Name: Jacqueline Bonilla MRN: [id number] Date of Birth: January 19, 1955  [facility]) CM/SW Contact:  Pollie Friar, RN Phone Number: 12/08/2020, 1:50 PM   Clinical Narrative:    Patient is discharging to CIR today. CM signing off.    Final next level of care: IP Rehab Facility Barriers to Discharge: No Barriers Identified   Patient Goals and CMS Choice     Choice offered to / list presented to : Patient  Discharge Placement                       Discharge Plan and Services                                     Social Determinants of Health (SDOH) Interventions     Readmission Risk Interventions No flowsheet data found.

## 2020-12-08 NOTE — Anesthesia Procedure Notes (Signed)
Procedure Name: MAC Date/Time: 12/08/2020 9:51 AM Performed by: Rande Brunt, CRNA Pre-anesthesia Checklist: Patient identified, Emergency Drugs available, Suction available and Patient being monitored Patient Re-evaluated:Patient Re-evaluated prior to induction Oxygen Delivery Method: Simple face mask Preoxygenation: Pre-oxygenation with 100% oxygen Induction Type: IV induction Airway Equipment and Method: Bite block Placement Confirmation: positive ETCO2 and CO2 detector Dental Injury: Teeth and Oropharynx as per pre-operative assessment

## 2020-12-08 NOTE — Progress Notes (Signed)
Courtney Heys, MD  Physician Physical Medicine and Rehabilitation PMR Pre-admission    Signed Date of Service:  12/04/2020  3:48 PM  Related encounter: ED to Hosp-Admission (Current) from 12/01/2020 in [facility]   Signed      Show:Clear all [x] Written[x] Templated[] Copied  Added by: [x] Cristina Gong, RN[x] Lind Covert, Lauren Mamie Nick, CCC-SLP[x] Courtney Heys, MD  [] Hover for details                                                                                                                                                                                                                                                                                                                                                                                                                                                          PMR Admission Coordinator Pre-Admission Assessment   Patient: Jacqueline Bonilla is an 65 y.o., female MRN: [id number] DOB: May 31, 1955 Height:   Weight: 54.4 kg   Insurance Information HMO: yes    PPO:      PCP:      IPA:      80/20:      OTHER:  PRIMARY: [employer]      Policy#: [id number]      Subscriber: patient CM Name: Fraser Din      Phone#: [phone]  Fax#: [phone] Pre-Cert#: [id number] Received insurance authorization from [place] on 12/05/20. Updates are due weekly.     Employer:  Benefits:  Phone #: n/a-online at [email or web address]     Name:  Eff. Date: 01/02/20-12/31/20   Deduct: does not  have one   Out of Pocket Max: $3,900 ($2,940 met)      Life Max: NA CIR: $295/day co-pay with max co-pay of $1,770/admisison      SNF: 100% coverage for days 1-20, $178/day co-pay for days 21-100 Outpatient: $10-$20/visit co-pay     Co-Pay:  Home Health: 100% coverage      Co-Pay:  DME: 80% coverage      Co-Pay: 20% co-insurance Providers: in-network SECONDARY:       Policy#:      Phone#:    Development worker, community:       Phone#:    The Engineer, petroleum" for patients in Inpatient Rehabilitation Facilities with attached "Privacy Act [place] Records" was provided and verbally reviewed with: Patient and Family   Emergency Contact Information Contact Information       Name Relation Home Work Mobile    [name],[name] Spouse     [phone]         Current Medical History  Patient Admitting Diagnosis: stroke   History of Present Illness: Pt is a 65 year old female with medical hx significant for: anemia, anxiety, depression, two prior strokes in the past 14 months, HTN, loop recorder implant (10/2019), osteoporosis, thrombocytopenia. Pt presented to hospital on 12/01/20 as a Code Stroke. Pt had acute onset of aphasia and worsened right-sided incoordination. CT head negative for acute abnormality. CT head/neck showed occlusion of distal left M2 MCA branch at posterior aspect of sylvian fissure. Pt received TNK. Pt had mechanical thrombectomy performed on 12/1. SAH noted in left sylvian fissure on post procedural CT. Repeat CT showed worsening of left SAH post IR procedure. Pt had TNK reversal and was given TXA. Another repeat CT showed stable SAH. Pt noted to have seizure like activity on 12/02/20. MRI showed small scattered left MCA infarcts with small amount of SAH. MRA showed recanalization of left M2. EEG revealed evidence of cortical dysfunction and an epileptogenicity arising from left frontotemporal region. left spikes but no seizure. Posey belt placed d/t agitation. Cortrak placed on 12/2. Diet was able to be advanced to regular/thin liquids when pt passed swallow function assessment. Therapy evaluations completed and CIR recommended d/t pt's deficits in functional mobility and ability to complete ADLs independently. TEE 12/8 with no source of embolus.   Complete  NIHSS TOTAL: 4   Patient's medical record from [facility] has been reviewed by the rehabilitation admission coordinator and physician.   Past Medical History      Past Medical History:  Diagnosis Date   Anemia     Anxiety     Depression     Dysmenorrhea     Endometriosis     Fibroid     Hypertension     Microhematuria      negative workup   Osteoporosis     Tachycardia     Thrombocytopenia (HCC)      Has the patient had major surgery during 100 days prior to admission? Yes   Family History   family history includes Cancer in her father; Dementia in her mother; Heart attack in her maternal grandfather; Polymyositis in her sister.   Current Medications   Current Facility-Administered Medications:    [MAR Hold]  stroke: mapping our  early stages of recovery book, , Does not apply, Once, Kerney Elbe, MD   0.9 %  sodium chloride infusion, , Intravenous, Continuous, [place], [place], [place], [place] at 12/08/20 0946   [MAR Hold] acetaminophen (TYLENOL) tablet 650 mg, 650 mg, Oral, Q4H PRN **OR** [MAR Hold] acetaminophen (TYLENOL) 160 MG/5ML solution 650 mg, 650 mg, Per Tube, Q4H PRN, 650 mg at 12/02/20 2357 **OR** [MAR Hold] acetaminophen (TYLENOL) suppository 650 mg, 650 mg, Rectal, Q4H PRN, de Sindy Messing, [place], MD   [facility] Hold] aspirin chewable tablet 81 mg, 81 mg, Oral, Daily, Palikh, Gaurang M, MD, 81 mg at 12/07/20 1030   [MAR Hold] atorvastatin (LIPITOR) tablet 40 mg, 40 mg, Oral, Daily, Palikh, Gaurang M, MD, 40 mg at 12/06/20 0953   [MAR Hold] bethanechol (URECHOLINE) tablet 5 mg, 5 mg, Oral, TID, Dorene Grebe, MD, 5 mg at 12/07/20 2146   [facility] Hold] Chlorhexidine Gluconate Cloth 2 % PADS 6 each, 6 each, Topical, Daily, Kerney Elbe, MD, 6 each at 12/07/20 1035   [MAR Hold] cholecalciferol (VITAMIN D3) tablet 1,000 Units, 1,000 Units, Oral, Daily, Kerney Elbe, MD, 1,000 Units at 12/07/20 1030   [MAR Hold] feeding supplement (ENSURE ENLIVE / ENSURE PLUS)  liquid 237 mL, 237 mL, Oral, BID BM, Leonie Man, Pramod S, MD, 237 mL at 12/07/20 1530   [MAR Hold] haloperidol lactate (HALDOL) injection 5 mg, 5 mg, Intravenous, Q6H PRN, Rosalin Hawking, MD, 5 mg at 12/03/20 2331   [facility] Hold] labetalol (NORMODYNE) injection 5-20 mg, 5-20 mg, Intravenous, Q10 min PRN, Rosalin Hawking, MD   [MAR Hold] levETIRAcetam (KEPPRA) tablet 500 mg, 500 mg, Oral, BID, Pham, Minh Q, RPH-CPP, 500 mg at 12/07/20 2146   [facility] Hold] losartan (COZAAR) tablet 25 mg, 25 mg, Oral, Daily, Palikh, Gaurang M, MD, 25 mg at 12/07/20 1030   [MAR Hold] multivitamin with minerals tablet 1 tablet, 1 tablet, Oral, Daily, Garvin Fila, MD, 1 tablet at 12/07/20 1700   [MAR Hold] ondansetron (ZOFRAN) injection 4 mg, 4 mg, Intravenous, Q6H PRN, de Sindy Messing, [place], MD   [facility] Hold] pantoprazole (PROTONIX) EC tablet 40 mg, 40 mg, Oral, Daily, Ardyth Harps B, RPH, 40 mg at 12/07/20 1030   [MAR Hold] senna-docusate (Senokot-S) tablet 1 tablet, 1 tablet, Oral, QHS PRN, Kerney Elbe, MD   [MAR Hold] tamsulosin [facility]) capsule 0.4 mg, 0.4 mg, Oral, QPC supper, Palikh, Gaurang M, MD, 0.4 mg at 12/07/20 1859   [MAR Hold] thiamine tablet 100 mg, 100 mg, Oral, Daily, Palikh, Gaurang M, MD, 100 mg at 12/07/20 1030   [MAR Hold] ticagrelor (BRILINTA) tablet 90 mg, 90 mg, Oral, BID, Garvin Fila, MD, 90 mg at 12/07/20 2146   Patients Current Diet:  Diet Order                  Diet NPO time specified Except for: Sips with Meds  Diet effective midnight                     NPO for TEE. On regular diet with thin liquids   Precautions / Restrictions Precautions Precautions: Fall, Other (comment) Precaution Comments: apraxic, ataxic Restrictions Weight Bearing Restrictions: No    Has the patient had 2 or more falls or a fall with injury in the past year? No   Prior Activity Level Community (5-7x/wk): gets out house ~5 days/week   Prior Functional Level Self Care: Did the patient need  help bathing, dressing, using the toilet or eating? Independent  Indoor Mobility: Did the patient need assistance with walking from room to room (with or without device)? Independent   Stairs: Did the patient need assistance with internal or external stairs (with or without device)? Independent   Functional Cognition: Did the patient need help planning regular tasks such as shopping or remembering to take medications? Independent   Patient Information Are you of Hispanic, Latino/a,or Spanish origin?: A. No, not of Hispanic, Latino/a, or Spanish origin What is your race?: A. White Do you need or want an interpreter to communicate with a doctor or health care staff?: 0. No   Patient's Response To:  Health Literacy and Transportation Is the patient able to respond to health literacy and transportation needs?: Yes Health Literacy - How often do you need to have someone help you when you read instructions, pamphlets, or other written material from your doctor or pharmacy?: Never In the past 12 months, has lack of transportation kept you from medical appointments or from getting medications?: No In the past 12 months, has lack of transportation kept you from meetings, work, or from getting things needed for daily living?: No   Home Assistive Devices / Equipment Home Equipment: Grab bars - tub/shower, Grab bars - toilet, Cane - single point   Prior Device Use: Indicate devices/aids used by the patient prior to current illness, exacerbation or injury? None of the above   Current Functional Level Cognition   Overall Cognitive Status: Impaired/Different from baseline Current Attention Level: Selective Orientation Level: Oriented X4 Following Commands: Follows one step commands consistently Safety/Judgement: Decreased awareness of safety, Decreased awareness of deficits General Comments: Follows one step commands well with increased time, needs reminders and further cues throughout session     Extremity Assessment (includes Sensation/Coordination)   Upper Extremity Assessment: Defer to OT evaluation RUE Deficits / Details: AROM overall WFL; apparent motor sensory processing deficits; "alien arm" features; using functionally for grooming with incrsaed effort RUE Coordination: decreased fine motor, decreased gross motor  Lower Extremity Assessment: Defer to PT evaluation RLE Deficits / Details: apraxic, AROM WFL RLE Coordination: decreased fine motor, decreased gross motor     ADLs   Overall ADL's : Needs assistance/impaired Eating/Feeding: Moderate assistance Eating/Feeding Details (indicate cue type and reason): will bring food to her mouth  "without having anything in her hand" per husband Grooming: Min guard, Standing, Sitting Grooming Details (indicate cue type and reason): brushing her teeth without a toothbrush in her hand Lower Body Bathing: Moderate assistance Upper Body Dressing : Moderate assistance Lower Body Dressing: Maximal assistance Toilet Transfer: Minimal assistance, Ambulation, Rolling walker (2 wheels) Toilet Transfer Details (indicate cue type and reason): min A for verbal cues and safety Toileting- Clothing Manipulation and Hygiene: Maximal assistance Functional mobility during ADLs: Minimal assistance, Rolling walker (2 wheels) General ADL Comments: min A for verbal cues, pt walking into obstacles and drifting towards the L, not attending to task and needs assist for RUE     Mobility   Overal bed mobility: Needs Assistance Bed Mobility: Sit to Supine, Supine to Sit Supine to sit: Min guard Sit to supine: Min assist General bed mobility comments: Pt with improved initiation today and able to scoot hips to EOB without physical assist, minA for sit>supine for BLE management     Transfers   Overall transfer level: Needs assistance Equipment used: Rolling walker (2 wheels) Transfers: Sit to/from Stand Sit to Stand: Min assist Bed to/from  chair/wheelchair/BSC transfer type:: Stand pivot Stand pivot transfers: Mod assist General transfer  comment: Cues needed for safe hand placement, scooting hips forward, and nose over toes     Ambulation / Gait / Stairs / Wheelchair Mobility   Ambulation/Gait Ambulation/Gait assistance: Herbalist (Feet): 50 Feet Assistive device: Rolling walker (2 wheels) Gait Pattern/deviations: Step-through pattern, Decreased stride length, Shuffle, Drifts right/left General Gait Details: Pt driting to R an unaware, multimodal cues to walk in center of hallway and not drift to R, pt also needed many cues for walker proximity and centering inside walker as she would often turn her body to the R inside the walker Gait velocity: decreased Gait velocity interpretation: <1.31 ft/sec, indicative of household ambulator     Posture / Balance Balance Overall balance assessment: Needs assistance Sitting-balance support: Feet supported, Single extremity supported Sitting balance-Leahy Scale: Fair Postural control: Right lateral lean Standing balance support: During functional activity, No upper extremity supported Standing balance-Leahy Scale: Fair Standing balance comment: Reliant on external support     Special needs/care consideration Hgb A1c 5.7    Previous Home Environment  Living Arrangements: Spouse/significant other  Lives With: Spouse Available Help at Discharge: Family, Friend(s), Available 24 hours/day Type of Home: House Home Layout: One level Home Access: Stairs to enter Entrance Stairs-Rails: Left Entrance Stairs-Number of Steps: 3 Bathroom Shower/Tub: Multimedia programmer: Handicapped height Bathroom Accessibility: Yes How Accessible: Accessible via walker [place]: No   Discharge Living Setting Plans for Discharge Living Setting: Patient's home Type of Home at Discharge: House Discharge Home Layout: One level Discharge Home Access: Stairs to  enter Entrance Stairs-Rails: Left Entrance Stairs-Number of Steps: 3 Discharge Bathroom Shower/Tub: Walk-in shower Discharge Bathroom Toilet: Handicapped height Discharge Bathroom Accessibility: Yes How Accessible: Accessible via walker Does the patient have any problems obtaining your medications?: No   Social/Family/Support Systems Patient Roles: Spouse Anticipated Caregiver: Khaya Theissen, husband Anticipated Caregiver's Contact Information: [phone] Caregiver Availability: 24/7 Discharge Plan Discussed with Primary Caregiver: Yes Is Caregiver In Agreement with Plan?: Yes Does Caregiver/Family have Issues with Lodging/Transportation while Pt is in Rehab?: No   Goals Patient/Family Goal for Rehab: Supervision: PT/OT, Mod I-Supervision: ST Expected length of stay: 14-16 days Pt/Family Agrees to Admission and willing to participate: Yes Program Orientation Provided & Reviewed with Pt/Caregiver Including Roles  & Responsibilities: Yes   Decrease burden of Care through IP rehab admission: NA   Possible need for SNF placement upon discharge: Not anticipated   Patient Condition: I have reviewed medical records from [facility], spoken with CSW, and patient and spouse. I met with patient at the bedside and discussed via phone for inpatient rehabilitation assessment.  Patient will benefit from ongoing PT, OT, and SLP, can actively participate in 3 hours of therapy a day 5 days of the week, and can make measurable gains during the admission.  Patient will also benefit from the coordinated team approach during an Inpatient Acute Rehabilitation admission.  The patient will receive intensive therapy as well as Rehabilitation physician, nursing, social worker, and care management interventions.  Due to safety, skin/wound care, disease management, medication administration, and patient education the patient requires 24 hour a day rehabilitation nursing.  The patient is currently min  assist overall with mobility and basic ADLs.  Discharge setting and therapy post discharge at home with home health is anticipated.  Patient has agreed to participate in the Acute Inpatient Rehabilitation Program and will admit today.   Preadmission Screen Completed By:  Bethel Born, 12/08/2020 10:47 AM ______________________________________________________________________   Discussed  status with Dr. Dagoberto Ligas on 12/08/2020 at 1046 and received approval for admission today.   Admission Coordinator:  Bethel Born, [place], time  1046 Date 12/08/2020    Assessment/Plan: Diagnosis: Does the need for close, 24 hr/day Medical supervision in concert with the patient's rehab needs make it unreasonable for this patient to be served in a less intensive setting? Yes Co-Morbidities requiring supervision/potential complications: prior stroke, L MCA stroke with aphasia, dysphagia, HTN, loop recorder, anxiety/depression Due to bladder management, bowel management, safety, skin/wound care, disease management, medication administration, and patient education, does the patient require 24 hr/day rehab nursing? Yes Does the patient require coordinated care of a physician, rehab nurse, PT, OT, and SLP to address physical and functional deficits in the context of the above medical diagnosis(es)? Yes Addressing deficits in the following areas: balance, endurance, locomotion, strength, transferring, bowel/bladder control, bathing, dressing, feeding, grooming, toileting, cognition, speech, and language Can the patient actively participate in an intensive therapy program of at least 3 hrs of therapy 5 days a week? Yes The potential for patient to make measurable gains while on inpatient rehab is good Anticipated functional outcomes upon discharge from inpatient rehab: supervision PT, supervision OT, supervision SLP Estimated rehab length of stay to reach the above functional goals is: 14-16  days Anticipated discharge destination: Home 10. Overall Rehab/Functional Prognosis: good     MD Signature:           Revision History                               Note Details  Jan Fireman, MD File Time 12/08/2020 11:08 AM  Author Type Physician Status Signed  Last Editor Courtney Heys, MD Service Physical Medicine and Rehabilitation

## 2020-12-08 NOTE — Anesthesia Preprocedure Evaluation (Signed)
Anesthesia Evaluation  Patient identified by MRN, date of birth, ID band Patient awake    Reviewed: Allergy & Precautions, NPO status , Patient's Chart, lab work & pertinent test results  Airway Mallampati: II  TM Distance: >3 FB     Dental no notable dental hx.    Pulmonary neg pulmonary ROS,    Pulmonary exam normal        Cardiovascular hypertension, Pt. on medications  Rhythm:Regular Rate:Normal     Neuro/Psych Anxiety Depression Dementia CVA (bilateral upper extremities ), Residual Symptoms    GI/Hepatic negative GI ROS, Neg liver ROS,   Endo/Other  negative endocrine ROS  Renal/GU negative Renal ROS  negative genitourinary   Musculoskeletal negative musculoskeletal ROS (+) Arthritis ,   Abdominal Normal abdominal exam  (+)   Peds  Hematology negative hematology ROS (+) anemia ,   Anesthesia Other Findings   Reproductive/Obstetrics                             Anesthesia Physical Anesthesia Plan  ASA: 3  Anesthesia Plan: MAC   Post-op Pain Management:    Induction:   PONV Risk Score and Plan: 2 and Propofol infusion and Treatment may vary due to age or medical condition  Airway Management Planned: Natural Airway, Simple Face Mask and Nasal Cannula  Additional Equipment: None  Intra-op Plan:   Post-operative Plan:   Informed Consent: I have reviewed the patients History and Physical, chart, labs and discussed the procedure including the risks, benefits and alternatives for the proposed anesthesia with the patient or authorized representative who has indicated his/her understanding and acceptance.     Dental advisory given  Plan Discussed with: CRNA  Anesthesia Plan Comments:         Anesthesia Quick Evaluation

## 2020-12-08 NOTE — Anesthesia Postprocedure Evaluation (Signed)
Anesthesia Post Note  Patient: Jacqueline Bonilla  Procedure(s) Performed: TRANSESOPHAGEAL ECHOCARDIOGRAM (TEE) BUBBLE STUDY     Patient location during evaluation: PACU Anesthesia Type: MAC Level of consciousness: awake and alert Pain management: pain level controlled Vital Signs Assessment: post-procedure vital signs reviewed and stable Respiratory status: spontaneous breathing, nonlabored ventilation, respiratory function stable and patient connected to nasal cannula oxygen Cardiovascular status: stable and blood pressure returned to baseline Postop Assessment: no apparent nausea or vomiting Anesthetic complications: no   No notable events documented.  Last Vitals:  Vitals:   12/08/20 1053 12/08/20 1108  BP: 113/67 113/75  Pulse: 72 74  Resp: 12 18  Temp:  36.8 C  SpO2: 92% 100%    Last Pain:  Vitals:   12/08/20 1108  TempSrc: Oral  PainSc:                  [name] Shamari Lofquist

## 2020-12-08 NOTE — Interval H&P Note (Signed)
History and Physical Interval Note:  12/08/2020 9:54 AM  Jacqueline Bonilla  has presented today for surgery, with the diagnosis of STROKE.  The various methods of treatment have been discussed with the patient and family. After consideration of risks, benefits and other options for treatment, the patient has consented to  Procedure(s): TRANSESOPHAGEAL ECHOCARDIOGRAM (TEE) (N/A) as a surgical intervention.  The patient's history has been reviewed, patient examined, no change in status, stable for surgery.  I have reviewed the patient's chart and labs.  Questions were answered to the patient's satisfaction.     Jenkins Rouge

## 2020-12-08 NOTE — H&P (Signed)
Physical Medicine and Rehabilitation Admission H&P    Chief Complaint  Patient presents with   Code Stroke  : HPI: Jacqueline Bonilla is a 65 year old right-handed female with history of anxiety/depression, chronic anemia, 2 prior strokes in the past 14 months and did receive inpatient rehab services 06/28/2020 - 07/28/2020 1 presented with right side weakness followed by improvement to mild residual deficits the second with left side weakness and maintained on aspirin, loop recorder implant October 2021, hypertension, osteoporosis, thrombocytopenia, vascular dementia.  Per chart review patient lives with spouse.  1 level home 3 steps to entry.  Modified independent and currently not driving.  Presented 12/01/2020 with acute onset of aphasia and right side incoordination relative to her baseline deficit.  Initial cranial CT scan negative for acute changes.  CT angiogram head and neck occlusion of distal left M2 MCA branch at the posterior aspect of the sylvian fissure.  No hemodynamically significant stenosis in the neck.  Patient underwent successful mechanical thrombectomy  per interventional radiology and a follow-up cranial CT scan showed subarachnoid hyperdensity over both hemispheres, left greater than right likely combination contrast extravasation and subarachnoid blood.  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  MRI/MRA completed 12/02/2020 scattered small acute infarcts in the left MCA distribution with also noted subarachnoid hemorrhage overlying the bilateral cerebral hemispheres not significantly changed from prior cranial CT scan.  Neurology follow-up currently maintained on low-dose aspirin therapy and Brilinta was later initiated.  Echocardiogram with ejection fraction of 55 to 60% no wall motion abnormalities.  TEE showed ejection fraction  60% without thrombus or PFO.  No source of embolus..  Tolerating a regular diet.  Bouts of urinary retention placed on low-dose Urecholine as well as Flomax.  Therapy evaluations completed due to patient's aphasia and right-sided incoordination was admitted for a comprehensive rehab program.   Pt reports had the TEE this AM- eating lunch right now- her husband notes she's "lost her manners" with this stroke- more irritable and perfunctory; decreased executive function.  Pt also reports LBM yesterday- her coffee with chicory root in it makes her go.  Urinating "ok"- but on Flomax and Urecholine.  Pt thinks her speech is better, but not resolved regarding her word finding deficits.     Review of Systems  Constitutional:  Negative for chills and fever.  HENT:  Negative for hearing loss.   Eyes:  Negative for blurred vision and double vision.  Respiratory:  Negative for cough and shortness of breath.   Cardiovascular:  Negative for chest pain, palpitations and leg swelling.  Gastrointestinal:  Positive for constipation. Negative for heartburn, nausea and vomiting.  Genitourinary:  Negative for dysuria, flank pain and hematuria.  Musculoskeletal:  Positive for myalgias.  Skin:  Negative for rash.  Neurological:  Positive for speech change and weakness.  Psychiatric/Behavioral:  Positive for depression.        Anxiety  All other systems reviewed and are negative. Past Medical History:  Diagnosis Date   Anemia    Anxiety    Depression    Dysmenorrhea    Endometriosis    Fibroid    Hypertension    Microhematuria    negative workup   Osteoporosis    Tachycardia    Thrombocytopenia ([place])    Past Surgical History:  Procedure Laterality Date   BREAST BIOPSY     CESAREAN  SECTION     hysteroscopic resection     implantable loop recorder implant  10/21/2019   Medtronic Reveal Linq model LNQ 22 ([place] [id number]) implantable loop recorder   IR CT HEAD LTD  12/01/2020   IR PERCUTANEOUS ART  THROMBECTOMY/INFUSION INTRACRANIAL INC DIAG ANGIO  12/01/2020   IR US GUIDE VASC ACCESS RIGHT  12/01/2020   RADIOLOGY WITH ANESTHESIA N/A 12/01/2020   Procedure: IR WITH ANESTHESIA - CODE STROKE;  Surgeon: Radiologist, Medication, MD;  Location: [facility];  Service: Radiology;  Laterality: N/A;   Family History  Problem Relation Age of Onset   Cancer Father        non hodgkin lymphoma & skin   Heart attack Maternal Grandfather    Dementia Mother    Polymyositis Sister    Social History:  reports that she has never smoked. She has never used smokeless tobacco. She reports that she does not drink alcohol and does not use drugs. Allergies:  Allergies  Allergen Reactions   Avelox [Moxifloxacin]    Ciprofloxacin Hives   Floraquin [Iodoquinol]     No cipro or others   Iohexol      Code: HIVES, Desc: HIVES S/P IVP MANY YRS AGO    Levaquin [Levofloxacin]    Plavix [Clopidogrel] Diarrhea   Quinolones    Shellfish Allergy Itching    Itching under the chin. Described by patient but not seen by husband.   Medications Prior to Admission  Medication Sig Dispense Refill   aspirin EC 81 MG tablet Take 81 mg by mouth daily. Swallow whole.     Barberry-Oreg Grape-Goldenseal (BERBERINE COMPLEX PO) Take 1 tablet by mouth daily.     losartan (COZAAR) 25 MG tablet TAKE 1 TABLET(25 MG) BY MOUTH DAILY (Patient taking differently: Take 25 mg by mouth daily.) 90 tablet 3   melatonin 5 MG TABS Take 5 mg by mouth at bedtime.     Multiple Vitamin (MULTIVITAMIN WITH MINERALS) TABS tablet Take 1 tablet by mouth daily.     Multiple Vitamins-Minerals (ZINC PO) Take 1 tablet by mouth daily.     Nattokinase 100 MG CAPS Take 200 mg by mouth daily.     OVER THE COUNTER MEDICATION Take 15 mg by mouth 2 (two) times daily as needed (anxiety). Cbd oil     Turmeric (QC TUMERIC COMPLEX PO) Take 1 tablet by mouth daily.     Cholecalciferol (VITAMIN D3 GUMMIES PO) Take 1,000 mcg by mouth daily. (Patient not taking: Reported on  12/02/2020)     Thiamine HCl (VITAMIN B-1 PO) Take 1 tablet by mouth daily. (Patient not taking: Reported on 12/02/2020)      Drug Regimen Review Drug regimen was reviewed and remains appropriate with no significant issues identified  Home: Home Living Family/patient expects to be discharged to:: Private residence Living Arrangements: Spouse/significant other Available Help at Discharge: Family, Friend(s), Available 24 hours/day Type of Home: House Home Access: Stairs to enter Technical brewer of Steps: 3 Entrance Stairs-Rails: Left Home Layout: One level Bathroom Shower/Tub: Multimedia programmer: Handicapped height Bathroom Accessibility: Yes Home Equipment: Grab bars - tub/shower, Grab bars - toilet, Cane - single point  Lives With: Spouse   Functional History: Prior Function Prior Level of Function : Independent/Modified Independent ADLs Comments: Had returned to PLOF since last CVA and enjoys walking on trails; had not returned to driving  Functional Status:  Mobility: Bed Mobility Overal bed mobility: Needs Assistance Bed Mobility: Sit to Supine, Supine to Sit Supine to  sit: Min guard Sit to supine: Min assist General bed mobility comments: Pt with improved initiation today and able to scoot hips to EOB without physical assist, minA for sit>supine for BLE management Transfers Overall transfer level: Needs assistance Equipment used: Rolling walker (2 wheels) Transfers: Sit to/from Stand Sit to Stand: Min assist Bed to/from chair/wheelchair/BSC transfer type:: Stand pivot Stand pivot transfers: Mod assist General transfer comment: Cues needed for safe hand placement, scooting hips forward, and nose over toes Ambulation/Gait Ambulation/Gait assistance: Min assist Gait Distance (Feet): 50 Feet Assistive device: Rolling walker (2 wheels) Gait Pattern/deviations: Step-through pattern, Decreased stride length, Shuffle, Drifts right/left General Gait  Details: Pt driting to R an unaware, multimodal cues to walk in center of hallway and not drift to R, pt also needed many cues for walker proximity and centering inside walker as she would often turn her body to the R inside the walker Gait velocity: decreased Gait velocity interpretation: <1.31 ft/sec, indicative of household ambulator    ADL: ADL Overall ADL's : Needs assistance/impaired Eating/Feeding: Moderate assistance Eating/Feeding Details (indicate cue type and reason): will bring food to her mouth  "without having anything in her hand" per husband Grooming: Min guard, Standing, Sitting Grooming Details (indicate cue type and reason): brushing her teeth without a toothbrush in her hand Lower Body Bathing: Moderate assistance Upper Body Dressing : Moderate assistance Lower Body Dressing: Maximal assistance Toilet Transfer: Minimal assistance, Ambulation, Rolling walker (2 wheels) Toilet Transfer Details (indicate cue type and reason): min A for verbal cues and safety Toileting- Clothing Manipulation and Hygiene: Maximal assistance Functional mobility during ADLs: Minimal assistance, Rolling walker (2 wheels) General ADL Comments: min A for verbal cues, pt walking into obstacles and drifting towards the L, not attending to task and needs assist for RUE  Cognition: Cognition Overall Cognitive Status: Impaired/Different from baseline Orientation Level: Oriented X4 Cognition Arousal/Alertness: Awake/alert Behavior During Therapy: WFL for tasks assessed/performed, Impulsive Overall Cognitive Status: Impaired/Different from baseline Area of Impairment: Attention, Following commands, Memory, Safety/judgement, Awareness, Problem solving Orientation Level: Disoriented to, Time, Situation Current Attention Level: Selective Memory: Decreased recall of precautions, Decreased short-term memory Following Commands: Follows one step commands consistently Safety/Judgement: Decreased  awareness of safety, Decreased awareness of deficits Awareness: Emergent Problem Solving: Slow processing, Decreased initiation, Difficulty sequencing, Requires verbal cues, Requires tactile cues General Comments: Follows one step commands well with increased time, needs reminders and further cues throughout session  Physical Exam: Blood pressure 111/71, pulse 89, temperature 98.2 F (36.8 C), temperature source Oral, resp. rate 18, weight 54.4 kg, last menstrual period 01/02/2003, SpO2 100 %. Physical Exam Vitals and nursing note reviewed. Exam conducted with a chaperone present.  Constitutional:      Appearance: She is normal weight.     Comments: Pt sitting up in bed; eating lunch; husband at bedside; joking back and forth somewhat; NAD- appears younger than stated age  HENT:     Head: Normocephalic and atraumatic.     Comments: Smile equal; tongue midline- sensation to light touch on face intact    Right Ear: External ear normal.     Left Ear: External ear normal.     Nose: Nose normal. No congestion.     Mouth/Throat:     Mouth: Mucous membranes are moist.     Pharynx: Oropharynx is clear. No oropharyngeal exudate.  Eyes:     General:        Right eye: No discharge.        Left eye:  No discharge.     Extraocular Movements: Extraocular movements intact.  Cardiovascular:     Rate and Rhythm: Normal rate and regular rhythm.     Heart sounds: Normal heart sounds. No murmur heard.   No gallop.  Pulmonary:     Comments: CTA B/L- no W/R/R- good air movement  Abdominal:     Comments: Soft, NT, ND, (+)BS - normoactive- eating lunch  Musculoskeletal:     Cervical back: Normal range of motion. No rigidity.     Comments: LUE 5/5 LLE 5/5 RUE- 4+/5 in same muscles RLE_ 4+/5 in same muscles  Skin:    General: Skin is warm and dry.  Neurological:     Comments: Patient is alert.  Follow commands.  Made eye contact with examiner.  Provides name and age with some mild delay in  processing and she cannot recall her admission in the past to CIR.  Patient is a limited medical historian. Pt having word finding deficits- can speak at the sentence level usually, however due to some impaired memory, and aphasia, was having difficulty making her higher level issues known- could handle basic info.  Naming 3/3 with a delay Intact to light touch in all 4 extremities FTN intact but slow B/L   Psychiatric:     Comments: Joking some- per husband is more irritable- didn't see this.     No results found for this or any previous visit (from the past 48 hour(s)). No results found.     Medical Problem List and Plan: 1.  Right side weakness and aphasia functional deficits secondary to left distal M2/M3 occlusion status post TNK status post revascularization complicated by small SAH with TN K reversal etiology recurrent cryptogenic shock/small acute infarct left MCA distribution as well as CVA x2 in the past  -patient may  shower  -ELOS/Goals: 14-16 days- supervision for PT,OT and SLP 2.  Antithrombotics: -DVT/anticoagulation:  Mechanical:  Antiembolism stockings, knee (TED hose) Bilateral lower extremities  -antiplatelet therapy: Aspirin 81 mg daily and Brilinta 90 mg twice daily 3. Pain Management: Tylenol as needed 4. Mood: Provide emotional support  -antipsychotic agents: N/A 5. Neuropsych: This patient is capable of making decisions on her own behalf. 6. Skin/Wound Care: Routine skin checks 7. Fluids/Electrolytes/Nutrition: Routine in and outs with follow-up chemistries 8.  Seizure prophylaxis.  EEG negative.  Keppra 500 mg twice daily 9.  Hypertension.  Cozaar 25 mg daily.  Monitor with increased mobility 10.  Hyperlipidemia.  Lipitor 11.  History of thrombocytopenia.  Latest platelet count 208,000. 12.  Urinary retention.  Urecholine 5 mg 3 times daily, Flomax 0.4 mg daily.  Check PVR's.    I have personally performed a face to face diagnostic evaluation of this  patient and formulated the key components of the plan.  Additionally, I have personally reviewed laboratory data, imaging studies, as well as relevant notes and concur with the physician assistant's documentation above.   The patient's status has not changed from the original H&P.  Any changes in documentation from the acute care chart have been noted above.       Lavon Paganini Angiulli, PA-C 12/08/2020

## 2020-12-08 NOTE — Progress Notes (Signed)
Patient ID: Jacqueline Bonilla, female   DOB: 1955-07-25, 65 y.o.   MRN: [id number] Met with the patient and spouse to review rehab routine, therapy schedule and plan of care. Reviewed secondary risks, medications and dietary modifications. Spouse managed diet well and keeps up with medications for patient. Discussed urinary frequency and medication for retention ordered. Continue to follow along to discharge to address educational needs and collaborate with the team to facilitate preparation for discharge home with spouse. Margarito Liner

## 2020-12-08 NOTE — Plan of Care (Signed)
  Problem: Coping: Goal: Will verbalize positive feelings about self Outcome: Progressing

## 2020-12-08 NOTE — Progress Notes (Signed)
PT Cancellation Note  Patient Details Name: Jacqueline Bonilla MRN: [id number] DOB: December 21, 1955   Cancelled Treatment:    Reason Eval/Treat Not Completed: (P) Patient at procedure or test/unavailable (pt off unit for TEE.) Will continue efforts per PT plan of care as schedule permits.   Kara Pacer Arville Postlewaite 12/08/2020, 9:48 AM

## 2020-12-08 NOTE — Progress Notes (Signed)
Inpatient Rehabilitation Admissions Coordinator   I have a Cir bed and can admit her today after TEE workup. I contacted Dr Leonie Man, acute team and TOC. I will make the arrangements to admit today.  Danne Baxter, RN, MSN Rehab Admissions Coordinator [phone] 12/08/2020 10:08 AM

## 2020-12-08 NOTE — H&P (Signed)
Physical Medicine and Rehabilitation Admission H&P        Chief Complaint  Patient presents with   Code Stroke  : HPI: Jacqueline Bonilla is a 65 year old right-handed female with history of anxiety/depression, chronic anemia, 2 prior strokes in the past 14 months and did receive inpatient rehab services 06/28/2020 - 07/28/2020 1 presented with right side weakness followed by improvement to mild residual deficits the second with left side weakness and maintained on aspirin, loop recorder implant October 2021, hypertension, osteoporosis, thrombocytopenia, vascular dementia.  Per chart review patient lives with spouse.  1 level home 3 steps to entry.  Modified independent and currently not driving.  Presented 12/01/2020 with acute onset of aphasia and right side incoordination relative to her baseline deficit.  Initial cranial CT scan negative for acute changes.  CT angiogram head and neck occlusion of distal left M2 MCA branch at the posterior aspect of the sylvian fissure.  No hemodynamically significant stenosis in the neck.  Patient underwent successful mechanical thrombectomy  per interventional radiology and a follow-up cranial CT scan showed subarachnoid hyperdensity over both hemispheres, left greater than right likely combination contrast extravasation and subarachnoid blood.  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  MRI/MRA completed 12/02/2020 scattered small acute infarcts in the left MCA distribution with also noted subarachnoid hemorrhage overlying the bilateral cerebral hemispheres not significantly changed from prior cranial CT scan.  Neurology follow-up currently maintained on low-dose aspirin therapy and Brilinta was later initiated.  Echocardiogram with ejection fraction of 55 to 60% no wall motion abnormalities.  TEE showed ejection  fraction 60% without thrombus or PFO.  No source of embolus..  Tolerating a regular diet.  Bouts of urinary retention placed on low-dose Urecholine as well as Flomax.  Therapy evaluations completed due to patient's aphasia and right-sided incoordination was admitted for a comprehensive rehab program.     Pt reports had the TEE this AM- eating lunch right now- her husband notes she's "lost her manners" with this stroke- more irritable and perfunctory; decreased executive function.  Pt also reports LBM yesterday- her coffee with chicory root in it makes her go.  Urinating "ok"- but on Flomax and Urecholine.  Pt thinks her speech is better, but not resolved regarding her word finding deficits.        Review of Systems  Constitutional:  Negative for chills and fever.  HENT:  Negative for hearing loss.   Eyes:  Negative for blurred vision and double vision.  Respiratory:  Negative for cough and shortness of breath.   Cardiovascular:  Negative for chest pain, palpitations and leg swelling.  Gastrointestinal:  Positive for constipation. Negative for heartburn, nausea and vomiting.  Genitourinary:  Negative for dysuria, flank pain and hematuria.  Musculoskeletal:  Positive for myalgias.  Skin:  Negative for rash.  Neurological:  Positive for speech change and weakness.  Psychiatric/Behavioral:  Positive for depression.        Anxiety  All other systems reviewed and are negative.     Past Medical History:  Diagnosis Date   Anemia     Anxiety     Depression     Dysmenorrhea     Endometriosis     Fibroid     Hypertension     Microhematuria      negative workup   Osteoporosis     Tachycardia  Thrombocytopenia [facility])           Past Surgical History:  Procedure Laterality Date   BREAST BIOPSY       CESAREAN SECTION       hysteroscopic resection       implantable loop recorder implant   10/21/2019    Medtronic Reveal Linq model LNQ 22 ([place] [id number]) implantable loop recorder   IR  CT HEAD LTD   12/01/2020   IR PERCUTANEOUS ART THROMBECTOMY/INFUSION INTRACRANIAL INC DIAG ANGIO   12/01/2020   IR US GUIDE VASC ACCESS RIGHT   12/01/2020   RADIOLOGY WITH ANESTHESIA N/A 12/01/2020    Procedure: IR WITH ANESTHESIA - CODE STROKE;  Surgeon: Radiologist, Medication, MD;  Location: [facility];  Service: Radiology;  Laterality: N/A;         Family History  Problem Relation Age of Onset   Cancer Father          non hodgkin lymphoma & skin   Heart attack Maternal Grandfather     Dementia Mother     Polymyositis Sister      Social History:  reports that she has never smoked. She has never used smokeless tobacco. She reports that she does not drink alcohol and does not use drugs. Allergies:       Allergies  Allergen Reactions   Avelox [Moxifloxacin]     Ciprofloxacin Hives   Floraquin [Iodoquinol]        No cipro or others   Iohexol         Code: HIVES, Desc: HIVES S/P IVP MANY YRS AGO     Levaquin [Levofloxacin]     Plavix [Clopidogrel] Diarrhea   Quinolones     Shellfish Allergy Itching      Itching under the chin. Described by patient but not seen by husband.          Medications Prior to Admission  Medication Sig Dispense Refill   aspirin EC 81 MG tablet Take 81 mg by mouth daily. Swallow whole.       Barberry-Oreg Grape-Goldenseal (BERBERINE COMPLEX PO) Take 1 tablet by mouth daily.       losartan (COZAAR) 25 MG tablet TAKE 1 TABLET(25 MG) BY MOUTH DAILY (Patient taking differently: Take 25 mg by mouth daily.) 90 tablet 3   melatonin 5 MG TABS Take 5 mg by mouth at bedtime.       Multiple Vitamin (MULTIVITAMIN WITH MINERALS) TABS tablet Take 1 tablet by mouth daily.       Multiple Vitamins-Minerals (ZINC PO) Take 1 tablet by mouth daily.       Nattokinase 100 MG CAPS Take 200 mg by mouth daily.       OVER THE COUNTER MEDICATION Take 15 mg by mouth 2 (two) times daily as needed (anxiety). Cbd oil       Turmeric (QC TUMERIC COMPLEX PO) Take 1 tablet by mouth daily.        Cholecalciferol (VITAMIN D3 GUMMIES PO) Take 1,000 mcg by mouth daily. (Patient not taking: Reported on 12/02/2020)       Thiamine HCl (VITAMIN B-1 PO) Take 1 tablet by mouth daily. (Patient not taking: Reported on 12/02/2020)          Drug Regimen Review Drug regimen was reviewed and remains appropriate with no significant issues identified   Home: Home Living Family/patient expects to be discharged to:: Private residence Living Arrangements: Spouse/significant other Available Help at Discharge: Family, Friend(s), Available 24 hours/day Type of Home: [place]  Access: Stairs to enter [employer] of Steps: 3 Entrance Stairs-Rails: Left Home Layout: One level Bathroom Shower/Tub: Multimedia programmer: Handicapped height Bathroom Accessibility: Yes Home Equipment: Grab bars - tub/shower, Grab bars - toilet, Cane - single point  Lives With: Spouse   Functional History: Prior Function Prior Level of Function : Independent/Modified Independent ADLs Comments: Had returned to PLOF since last CVA and enjoys walking on trails; had not returned to driving   Functional Status:  Mobility: Bed Mobility Overal bed mobility: Needs Assistance Bed Mobility: Sit to Supine, Supine to Sit Supine to sit: Min guard Sit to supine: Min assist General bed mobility comments: Pt with improved initiation today and able to scoot hips to EOB without physical assist, minA for sit>supine for BLE management Transfers Overall transfer level: Needs assistance Equipment used: Rolling walker (2 wheels) Transfers: Sit to/from Stand Sit to Stand: Min assist Bed to/from chair/wheelchair/BSC transfer type:: Stand pivot Stand pivot transfers: Mod assist General transfer comment: Cues needed for safe hand placement, scooting hips forward, and nose over toes Ambulation/Gait Ambulation/Gait assistance: Min assist Gait Distance (Feet): 50 Feet Assistive device: Rolling walker (2 wheels) Gait  Pattern/deviations: Step-through pattern, Decreased stride length, Shuffle, Drifts right/left General Gait Details: Pt driting to R an unaware, multimodal cues to walk in center of hallway and not drift to R, pt also needed many cues for walker proximity and centering inside walker as she would often turn her body to the R inside the walker Gait velocity: decreased Gait velocity interpretation: <1.31 ft/sec, indicative of household ambulator   ADL: ADL Overall ADL's : Needs assistance/impaired Eating/Feeding: Moderate assistance Eating/Feeding Details (indicate cue type and reason): will bring food to her mouth  "without having anything in her hand" per husband Grooming: Min guard, Standing, Sitting Grooming Details (indicate cue type and reason): brushing her teeth without a toothbrush in her hand Lower Body Bathing: Moderate assistance Upper Body Dressing : Moderate assistance Lower Body Dressing: Maximal assistance Toilet Transfer: Minimal assistance, Ambulation, Rolling walker (2 wheels) Toilet Transfer Details (indicate cue type and reason): min A for verbal cues and safety Toileting- Clothing Manipulation and Hygiene: Maximal assistance Functional mobility during ADLs: Minimal assistance, Rolling walker (2 wheels) General ADL Comments: min A for verbal cues, pt walking into obstacles and drifting towards the L, not attending to task and needs assist for RUE   Cognition: Cognition Overall Cognitive Status: Impaired/Different from baseline Orientation Level: Oriented X4 Cognition Arousal/Alertness: Awake/alert Behavior During Therapy: WFL for tasks assessed/performed, Impulsive Overall Cognitive Status: Impaired/Different from baseline Area of Impairment: Attention, Following commands, Memory, Safety/judgement, Awareness, Problem solving Orientation Level: Disoriented to, Time, Situation Current Attention Level: Selective Memory: Decreased recall of precautions, Decreased  short-term memory Following Commands: Follows one step commands consistently Safety/Judgement: Decreased awareness of safety, Decreased awareness of deficits Awareness: Emergent Problem Solving: Slow processing, Decreased initiation, Difficulty sequencing, Requires verbal cues, Requires tactile cues General Comments: Follows one step commands well with increased time, needs reminders and further cues throughout session   Physical Exam: Blood pressure 111/71, pulse 89, temperature 98.2 F (36.8 C), temperature source Oral, resp. rate 18, weight 54.4 kg, last menstrual period 01/02/2003, SpO2 100 %. Physical Exam Vitals and nursing note reviewed. Exam conducted with a chaperone present.  Constitutional:      Appearance: She is normal weight.     Comments: Pt sitting up in bed; eating lunch; husband at bedside; joking back and forth somewhat; NAD- appears younger than stated age  HENT:  Head: Normocephalic and atraumatic.     Comments: Smile equal; tongue midline- sensation to light touch on face intact    Right Ear: External ear normal.     Left Ear: External ear normal.     Nose: Nose normal. No congestion.     Mouth/Throat:     Mouth: Mucous membranes are moist.     Pharynx: Oropharynx is clear. No oropharyngeal exudate.  Eyes:     General:        Right eye: No discharge.        Left eye: No discharge.     Extraocular Movements: Extraocular movements intact.  Cardiovascular:     Rate and Rhythm: Normal rate and regular rhythm.     Heart sounds: Normal heart sounds. No murmur heard.   No gallop.  Pulmonary:     Comments: CTA B/L- no W/R/R- good air movement   Abdominal:     Comments: Soft, NT, ND, (+)BS - normoactive- eating lunch  Musculoskeletal:     Cervical back: Normal range of motion. No rigidity.     Comments: LUE 5/5 LLE 5/5 RUE- 4+/5 in same muscles RLE_ 4+/5 in same muscles  Skin:    General: Skin is warm and dry.  Neurological:     Comments: Patient is  alert.  Follow commands.  Made eye contact with examiner.  Provides name and age with some mild delay in processing and she cannot recall her admission in the past to CIR.  Patient is a limited medical historian. Pt having word finding deficits- can speak at the sentence level usually, however due to some impaired memory, and aphasia, was having difficulty making her higher level issues known- could handle basic info.  Naming 3/3 with a delay Intact to light touch in all 4 extremities FTN intact but slow B/L   Psychiatric:     Comments: Joking some- per husband is more irritable- didn't see this.       Lab Results Last 48 Hours  No results found for this or any previous visit (from the past 48 hour(s)).   Imaging Results (Last 48 hours)  No results found.           Medical Problem List and Plan: 1.  Right side weakness and aphasia functional deficits secondary to left distal M2/M3 occlusion status post TNK status post revascularization complicated by small SAH with TN K reversal etiology recurrent cryptogenic shock/small acute infarct left MCA distribution as well as CVA x2 in the past             -patient may  shower             -ELOS/Goals: 14-16 days- supervision for PT,OT and SLP 2.  Antithrombotics: -DVT/anticoagulation:  Mechanical:  Antiembolism stockings, knee (TED hose) Bilateral lower extremities             -antiplatelet therapy: Aspirin 81 mg daily and Brilinta 90 mg twice daily 3. Pain Management: Tylenol as needed 4. Mood: Provide emotional support             -antipsychotic agents: N/A 5. Neuropsych: This patient is capable of making decisions on her own behalf. 6. Skin/Wound Care: Routine skin checks 7. Fluids/Electrolytes/Nutrition: Routine in and outs with follow-up chemistries 8.  Seizure prophylaxis.  EEG negative.  Keppra 500 mg twice daily 9.  Hypertension.  Cozaar 25 mg daily.  Monitor with increased mobility 10.  Hyperlipidemia.  Lipitor 11.  History of  thrombocytopenia.  Latest platelet count  208,000. 12.  Urinary retention.  Urecholine 5 mg 3 times daily, Flomax 0.4 mg daily.  Check PVR's.      I have personally performed a face to face diagnostic evaluation of this patient and formulated the key components of the plan.  Additionally, I have personally reviewed laboratory data, imaging studies, as well as relevant notes and concur with the physician assistant's documentation above.   The patient's status has not changed from the original H&P.  Any changes in documentation from the acute care chart have been noted above.           Cathlyn Parsons, PA-C 12/08/2020        Physical Medicine and Rehabilitation Admission H&P        Chief Complaint  Patient presents with   Stroke with functional deficits.       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 She has waxing and waning of symptoms per EMS and CT head negative and patien tiwth mild left aided weakness and ataxia. She decline tPA. MRI/MRA brain done revealing punctate infarct in high left frontal coretex and faint areas of DWI signal in bilateral precentral gyri question acute/subacute infarct or artifact. Incidental note of multiple remote cortical infarcts in cerebral and cerebellar lacunar infarcts. Stroke felt to be embolic and loop interrogated but negative for A fib. Dr. Erlinda Hong recommended ASA/Plavix X 3 weeks followed by ASA alone. Carotid dopplers negative for significant ICA stenosis. BLE dopplers negative for DVT. Patient with residual mild cognitive deficits with moderate word finding deficits, left sided weakness with apraxia and needs extra time for  processing. CIR recommended due to functional decline.    Pt reports just got back sensation in RUE/ however hasn't lost any sensation in L side with this new stroke. Denies pain. LBM this AM- is very regular. Notes still has trouble with writing from previus stroke  with R hand. Was warm, sp took off socks, but now feet cold.      Review of Systems Constitutional:  Negative for chills and fever. HENT:  Negative for ear pain and hearing loss.   Eyes:  Negative for blurred vision and double vision. Respiratory:  Positive for cough. Negative for shortness of breath.   Cardiovascular:  Negative for chest pain and palpitations. Gastrointestinal:  Negative for constipation. Genitourinary:  Negative for dysuria, frequency and urgency. Musculoskeletal:  Positive for back pain (numbness due to back injury--can hurt with palpation/stretching.). Negative for myalgias and neck pain. Skin:  Negative for itching and rash. Neurological:  Positive for sensory change (? left foot.) and weakness. Negative for dizziness and headaches. Psychiatric/Behavioral:  Positive for memory loss.   All other systems reviewed and are negative.         Past Medical History:  Diagnosis Date   Anemia     Anxiety     Depression     Dysmenorrhea     Endometriosis     Fibroid     Hypertension     Microhematuria      negative workup   Osteoporosis     Tachycardia     Thrombocytopenia (HCC)             Past  Surgical History:  Procedure Laterality Date   BREAST BIOPSY       CESAREAN SECTION       hysteroscopic resection       implantable loop recorder implant   10/21/2019    Medtronic Reveal Linq model LNQ 22 ([place] [id number]) implantable loop recorder           Family History  Problem Relation Age of Onset   Cancer Father          non hodgkin lymphoma & skin   Heart attack Maternal Grandfather     Dementia Mother     Polymyositis Sister        Social History: Married. Independent without AD and  goes to the gym 7 days a week for about 30 minutes. She is a retired Optometrist, She does not use tobacco, smokeless tobacco, ETOH or illicit drugs.           Allergies  Allergen Reactions   Avelox [Moxifloxacin]     Ciprofloxacin Hives   Floraquin [Iodoquinol]        No cipro or others   Iohexol         Code: HIVES, Desc: HIVES S/P IVP MANY YRS AGO...NO PROBLEM W/O PREP TODAY/A.[place], Onset Date: [id number]     Levaquin [Levofloxacin]     Quinolones     Shellfish Allergy Itching      Itching under the chin. Described by patient but not seen by husband.            Medications Prior to Admission  Medication Sig Dispense Refill   Ascorbic Acid (VITAMIN C PO) Take 1 tablet by mouth daily.       aspirin EC 81 MG tablet Take 1 tablet (81 mg total) by mouth daily. Swallow whole. 30 tablet 11   [START ON 06/29/2020] atorvastatin (LIPITOR) 40 MG tablet Take 1 tablet (40 mg total) by mouth daily.       Cholecalciferol (VITAMIN D3 GUMMIES PO) Take 1,000 mcg by mouth daily.       [START ON 06/29/2020] clopidogrel (PLAVIX) 75 MG tablet Take 1 tablet (75 mg total) by mouth daily.       losartan (COZAAR) 25 MG tablet Take 1 tablet (25 mg total) by mouth daily. (Patient taking differently: Take 25 mg by mouth daily in the afternoon.) 90 tablet 3   Multiple Vitamins-Minerals (ZINC PO) Take 1 tablet by mouth daily.       Thiamine HCl (VITAMIN B-1 PO) Take 1 tablet by mouth daily.          Drug Regimen Review  Drug regimen was reviewed and remains appropriate with no significant issues identified   Home: Home Living Family/patient expects to be discharged to:: Other (Comment) (CIR) Living Arrangements: Spouse/significant other Available Help at Discharge: Family, Available 24 hours/day Type of Home: House Home Access: Stairs to enter [employer] of Steps: 4 Entrance Stairs-Rails: None Home Layout: One level Bathroom Shower/Tub: Multimedia programmer: Standard Bathroom  Accessibility: No Home Equipment: Kasandra Knudsen - single point  Lives With: Spouse Psychologist, counselling)   Functional History: Prior Function Level of Independence: Independent with assistive device(s) Comments: Spouse completes all meal prep, community mobility, IADL, bill payment and med management.  She walked without an AD, and was able to perfrom her own self care. Active at the gym   Functional Status:  Mobility: Bed Mobility Overal bed mobility: Needs Assistance Bed Mobility: Supine to Sit Supine to sit: Min assist General bed mobility comments: pt  in recliner upon arrival Transfers Overall transfer level: Needs assistance Equipment used: 2 person hand held assist Transfers: Sit to/from Stand Sit to Stand: Mod assist, +2 physical assistance, +2 safety/equipment General transfer comment: Cues for sequencing to take larger steps with fatigue. Ambulation/Gait Ambulation/Gait assistance: +2 physical assistance, Mod assist Gait Distance (Feet): 20 Feet Assistive device: 2 person hand held assist Gait Pattern/deviations: Step-to pattern, Step-through pattern, Narrow base of support, Ataxic, Decreased weight shift to left General Gait Details: bilat assist to maintain posture/balance. Cues for sequencing and step length LLE. Assist with lateral weight shifting. Decreased proprioception LLE. Gait velocity: decreased Gait velocity interpretation: <1.8 ft/sec, indicate of risk for recurrent falls   ADL: ADL Overall ADL's : Needs assistance/impaired Eating/Feeding: Set up, Sitting Grooming: Wash/dry hands, Supervision/safety, Sitting Upper Body Dressing : Minimal assistance, Sitting Lower Body Dressing: Moderate assistance, Sitting/lateral leans Lower Body Dressing Details (indicate cue type and reason): Pt requiring mod A to don sock due to inability to place sock over toes. Toilet Transfer: Moderate assistance, Ambulation, +2 for physical assistance, +2 for safety/equipment Toileting- Clothing  Manipulation and Hygiene: Min guard, Sitting/lateral lean Functional mobility during ADLs: Moderate assistance, +2 for physical assistance, +2 for safety/equipment General ADL Comments: Pt perfroming functional moblity with mod A +2 hand held assist.   Cognition: Cognition Overall Cognitive Status: History of cognitive impairments - at baseline Arousal/Alertness: Awake/alert Orientation Level: Oriented X4 Attention: Focused, Sustained Focused Attention: Appears intact Sustained Attention: Appears intact Memory: Impaired Memory Impairment: Retrieval deficit Problem Solving: Appears intact Executive Function: Reasoning Reasoning: Appears intact Cognition Arousal/Alertness: Awake/alert Behavior During Therapy: WFL for tasks assessed/performed Overall Cognitive Status: History of cognitive impairments - at baseline General Comments: decreased memory, increased time to follow commands.     Blood pressure 116/71, pulse 92, temperature 98.1 F (36.7 C), temperature source Oral, resp. rate 20, height 5\' 4"  (1.626 m), weight 52.4 kg, last menstrual period 01/02/2003, SpO2 99 %. Physical Exam Vitals and nursing note reviewed. Exam conducted with a chaperone present. Constitutional:      Appearance: Normal appearance.    Comments: Awake, alert, appropriate, sitting up in bedside chair, PA in room, NAD- sing song voice  HENT:    Head: Normocephalic and atraumatic.    Comments: Very mild R facial droop from previous stroke; tongue midline    Right Ear: External ear normal.    Left Ear: External ear normal.    Nose: Nose normal. No congestion.    Mouth/Throat:    Mouth: Mucous membranes are moist.    Pharynx: Oropharynx is clear. No oropharyngeal exudate. Eyes:    General:        Right eye: No discharge.        Left eye: No discharge.    Extraocular Movements: Extraocular movements intact.    Comments: Mild R lid lag (pt says chronic- as well as pupil on R very slightly smaller than L)   Cardiovascular:    Rate and Rhythm: Normal rate and regular rhythm.    Heart sounds: Normal heart sounds. No murmur heard.   No gallop. Pulmonary:    Comments: CTA B/L- no W/R/R- good air movement     Abdominal:    Comments: Soft, NT, ND, (+)BS - hypoactive  Musculoskeletal:    Cervical back: Normal range of motion and neck supple.    Comments: RUE- 4+/5 in deltoid, biceps, triceps; WE 5-/5, and grip and FA 4/5 LUE- 5-/5 in all muscles RLE- 5/5 in all muscles tested LLE- HF 5-/5,  KE/KF 4+/5, DF 1/5, PF 2/5, EHL 2-/5   Skin:    General: Skin is warm and dry.    Comments: Bilateral feet with mild dependent cyanosis--cool/cold to touch.   Is blanchable B/L No bogginess or wounds on heels B/L Multiple scars on lumbar spine- mainly on L side- small swelling above scars   Neurological:    Mental Status: She is alert and oriented to person, place, and time.    Comments: Able to answer orientation and biographic questions without difficulty. Speech clear. Able to follow basic commands without difficulty.   Psychiatric:    Comments: Sing song voice - appropriate, however a little flat      Lab Results Last 48 Hours  No results found for this or any previous visit (from the past 48 hour(s)).    Imaging Results (Last 48 hours)  VAS [name] LOWER EXTREMITY VENOUS (DVT)   Result Date: 06/27/2020  Lower Venous DVT Study Patient Name:  LATRISE BOWLAND Rennert  Date of Exam:   06/27/2020 Medical Rec #: [id number]           Accession #:    [id number] Date of Birth: 01/14/1955           Patient Gender: F Patient Age:   65Y Exam Location:  [facility] Procedure:      VAS [name] LOWER EXTREMITY VENOUS (DVT) Referring Phys: [id number] Rosalin Hawking --------------------------------------------------------------------------------  Indications: Stroke.  Risk Factors: None identified. Limitations: Ambient room light. Comparison Study: No prior studies. Performing Technologist: Oliver Hum RVT  Examination  Guidelines: A complete evaluation includes B-mode imaging, spectral Doppler, color Doppler, and power Doppler as needed of all accessible portions of each vessel. Bilateral testing is considered an integral part of a complete examination. Limited examinations for reoccurring indications may be performed as noted. The reflux portion of the exam is performed with the patient in reverse Trendelenburg.  +---------+---------------+---------+-----------+----------+--------------+ RIGHT    CompressibilityPhasicitySpontaneityPropertiesThrombus Aging +---------+---------------+---------+-----------+----------+--------------+ CFV      Full           Yes      Yes                                 +---------+---------------+---------+-----------+----------+--------------+ SFJ      Full                                                        +---------+---------------+---------+-----------+----------+--------------+ FV Prox  Full                                                        +---------+---------------+---------+-----------+----------+--------------+ FV Mid   Full                                                        +---------+---------------+---------+-----------+----------+--------------+ FV DistalFull                                                        +---------+---------------+---------+-----------+----------+--------------+  PFV      Full                                                        +---------+---------------+---------+-----------+----------+--------------+ POP      Full           Yes      Yes                                 +---------+---------------+---------+-----------+----------+--------------+ PTV      Full                                                        +---------+---------------+---------+-----------+----------+--------------+ PERO     Full                                                         +---------+---------------+---------+-----------+----------+--------------+   +---------+---------------+---------+-----------+----------+--------------+ LEFT     CompressibilityPhasicitySpontaneityPropertiesThrombus Aging +---------+---------------+---------+-----------+----------+--------------+ CFV      Full           Yes      Yes                                 +---------+---------------+---------+-----------+----------+--------------+ SFJ      Full                                                        +---------+---------------+---------+-----------+----------+--------------+ FV Prox  Full                                                        +---------+---------------+---------+-----------+----------+--------------+ FV Mid   Full                                                        +---------+---------------+---------+-----------+----------+--------------+ FV DistalFull                                                        +---------+---------------+---------+-----------+----------+--------------+ PFV      Full                                                        +---------+---------------+---------+-----------+----------+--------------+  POP      Full           Yes      Yes                                 +---------+---------------+---------+-----------+----------+--------------+ PTV      Full                                                        +---------+---------------+---------+-----------+----------+--------------+ PERO     Full                                                        +---------+---------------+---------+-----------+----------+--------------+     Summary: RIGHT: - There is no evidence of deep vein thrombosis in the lower extremity.  - No cystic structure found in the popliteal fossa.  LEFT: - There is no evidence of deep vein thrombosis in the lower extremity.  - No cystic structure found in the popliteal fossa.   *See table(s) above for measurements and observations. Electronically signed by Ruta Hinds MD on 06/27/2020 at 7:04:52 PM.    Final              Medical Problem List and Plan: 1.  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  Antithrombotics: -DVT/anticoagulation:  Pharmaceutical: Lovenox             -antiplatelet therapy: DAPT X 3 weeks followed by ASA alone.  3. Pain Management: N/A 4. Mood: LCSW to follow for evaluation and support.              -antipsychotic agents: N/a 5. Neuropsych: This patient may not be fully capable of making decisions on her own behalf. 6. Skin/Wound Care: Routine pressure relief measures. 7. Fluids/Electrolytes/Nutrition: Monitor I/O. Check lytes in am. 8. HTN: Monitor BP tid--continue Cozaar. 9. H/o thrombocytopenia: Recheck CBC in am as now on DAPT 11. Dyslipidemia: Continue Lipitor.          Bary Leriche, PA-C 06/28/2020  I have personally performed a face to face diagnostic evaluation of this patient and formulated the key components of the plan.  Additionally, I have personally reviewed laboratory data, imaging studies, as well as relevant notes and concur with the physician assistant's documentation above.   The patient's status has not changed from the original H&P.  Any changes in documentation from the acute care chart have been noted above.

## 2020-12-08 NOTE — Progress Notes (Signed)
Patient ID: Jacqueline Bonilla, female   DOB: [birth year], 65 y.o.   MRN: [id number]  INPATIENT REHABILITATION ADMISSION NOTE   Arrival Method: bed      Mental Orientation: A&O x4   Assessment: completed   Skin: intact except for skin rash on abdomen    IV'S:1x 20 G iv in R AC    Pain: 0/10   Tubes and Drains: none   Safety Measures: educated on fall risk    Vital Signs: in chart    Height and Weight: 64 inches 108 lbs   Rehab Orientation: completed    Family:    Notes:

## 2020-12-08 NOTE — Transfer of Care (Signed)
Immediate Anesthesia Transfer of Care Note  Patient: Jacqueline Bonilla  Procedure(s) Performed: TRANSESOPHAGEAL ECHOCARDIOGRAM (TEE) BUBBLE STUDY  Patient Location: Endoscopy Unit  Anesthesia Type:MAC  Level of Consciousness: awake, alert  and drowsy  Airway & Oxygen Therapy: Patient Spontanous Breathing  Post-op Assessment: Report given to RN, Post -op Vital signs reviewed and stable and Patient moving all extremities X 4  Post vital signs: Reviewed and stable  Last Vitals:  Vitals Value Taken Time  BP    Temp    Pulse 98   Resp 11 12/08/20 1020  SpO2 100   Vitals shown include unvalidated device data.  Last Pain:  Vitals:   12/08/20 0930  TempSrc: Temporal  PainSc: 4       Patients Stated Pain Goal: 0 (27/06/23 7628)  Complications: No notable events documented.

## 2020-12-08 NOTE — Progress Notes (Addendum)
STROKE TEAM PROGRESS NOTE   SUBJECTIVE (INTERVAL HISTORY) Patient is resting comfortably in bed with her husband at the bedside.  She had no acute events overnight and her vital signs remain stable her neuro exam remains unchanged.  Plan for discharge to inpatient rehab after TEE is completed.  We did discuss using Brilinta and aspirin for 30 days. Serum sodium is slightly low at 132 and will trend.  CBC is normal.  Vital signs stable.  Neurological exam is improving. TEE done today was normal without evidence of clot, PFO or cardiac source of embolism.  Patient has signed consent for screening for [place] stroke prevention trial and will undergo EKG and BNP lab test for screening for the study.   OBJECTIVE Vitals:   12/08/20 1031 12/08/20 1035 12/08/20 1053 12/08/20 1108  BP: 126/70 125/67 113/67 113/75  Pulse: 79 78 72 74  Resp: 12 (!) 9 12 18   Temp:    98.3 F (36.8 C)  TempSrc:    Oral  SpO2:   92% 100%  Weight:        CBC:  Recent Labs  Lab 12/05/20 0333 12/06/20 0141  WBC 6.9 6.1  HGB 11.4* 11.1*  HCT 34.7* 33.3*  MCV 92.3 91.5  PLT 185 208     Basic Metabolic Panel:  Recent Labs  Lab 12/03/20 0423 12/03/20 1634 12/04/20 0629 12/05/20 0333 12/06/20 0141  NA 136  --    < > 135 132*  K 4.2  --    < > 4.2 3.8  CL 103  --    < > 102 101  CO2 26  --    < > 25 22  GLUCOSE 121*  --    < > 105* 117*  BUN 20  --    < > 23 32*  CREATININE 0.87  --    < > 0.81 0.96  CALCIUM 8.7*  --    < > 8.9 8.8*  MG 1.9 1.8  --   --   --   PHOS 3.9 3.4  --   --   --    < > = values in this interval not displayed.     Lipid Panel:  Recent Labs  Lab 12/02/20 0440  CHOL 153  TRIG 81  HDL 48  CHOLHDL 3.2  VLDL 16  LDLCALC 89    HgbA1c:  Lab Results  Component Value Date   HGBA1C 5.7 (H) 12/02/2020   Urine Drug Screen: No results found for: LABOPIA, COCAINSCRNUR, LABBENZ, AMPHETMU, THCU, LABBARB  Alcohol Level No results found for: [facility]  IMAGING  Results for orders  placed or performed during the hospital encounter of 12/01/20  CT HEAD WO CONTRAST (5MM)   Narrative   CLINICAL DATA:  Follow-up after neurovascular intervention.  EXAM: CT HEAD WITHOUT CONTRAST  TECHNIQUE: Contiguous axial images were obtained from the base of the skull through the vertex without intravenous contrast.  COMPARISON:  12/01/2020  FINDINGS: Brain: Allowing for redistribution, unchanged subarachnoid hyperdensity over both hemispheres, left-greater-than-right. A suspected intraparenchymal component in the left parietal lobe is unchanged. There is periventricular hypoattenuation compatible with chronic microvascular disease. Generalized volume loss. No hydrocephalus.  Vascular: No abnormal hyperdensity of the major intracranial arteries or dural venous sinuses. No intracranial atherosclerosis.  Skull: The visualized skull base, calvarium and extracranial soft tissues are normal.  Sinuses/Orbits: No fluid levels or advanced mucosal thickening of the visualized paranasal sinuses. No mastoid or middle ear effusion. The orbits are normal.  IMPRESSION:  1. Unchanged subarachnoid hyperdensity over both hemispheres, left-greater-than-right, likely a combination contrast extravasation and subarachnoid blood. 2. Unchanged subcortical intraparenchymal blood in the left parietal lobe.   Electronically Signed   By: Ulyses Jarred M.D.   On: 12/02/2020 03:36   Results for orders placed or performed during the hospital encounter of 06/24/20  MR BRAIN WO CONTRAST   Narrative   CLINICAL DATA:  Neuro deficit, acute stroke suspected  EXAM: MRI HEAD WITHOUT CONTRAST  MRA HEAD WITHOUT CONTRAST  TECHNIQUE: Multiplanar, multi-echo pulse sequences of the brain and surrounding structures were acquired without intravenous contrast. Angiographic images of the Circle of Willis were acquired using MRA technique without intravenous contrast.  COMPARISON:  And a CT code stroke.  MRI head June 23, 2019. CTA June 30 2019.  FINDINGS: MRI HEAD FINDINGS  Brain: Punctate focus of restricted diffusion in the high left frontal cortex (series 3, image 38). No significant edema or mass effect. Additional punctate areas of faint DWI signal in bilateral precentral gyri (series 3, images 37 and 39). Redemonstrated multiple remote cortical infarcts in bilateral frontal parietal and occipital lobes. Multiple bilateral cerebellar lacunar infarcts. Moderate scattered patchy confluent T2/FLAIR hyperintensities within the white matter most likely related to chronic microvascular ischemic disease. No hydrocephalus. Age advanced moderate cerebral atrophy with ex vacuo ventricular dilation. No acute hemorrhage. No mass lesion or abnormal mass effect. No extra-axial fluid collection.  Vascular: See below.  Skull and upper cervical spine: Normal marrow signal.  Sinuses/Orbits: Clear sinuses.  Unremarkable orbits.  Other: No sizable mastoid effusions.  MRA HEAD FINDINGS  Anterior circulation: Bilateral ICAs, MCAs, and ACAs are patent without flow-limiting proximal stenosis. No aneurysm identified.  Posterior circulation: The visualized intradural vertebral arteries, basilar artery and posterior cerebral arteries are patent without flow limiting proximal stenosis. No aneurysm identified.  IMPRESSION: MRI:  1. Punctate acute infarct in the high left frontal cortex. Additional faint punctate areas of DWI signal in bilateral precentral gyri are suspicious for additional acute or subacute infarcts, but could potentially represent artifact. Given potential involvement of multiple vascular territories, consider a central embolic etiology. 2. 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 3. Moderate atrophy.  MRA:  No large vessel occlusion or proximal flow limiting  stenosis.   Electronically Signed   By: Margaretha Sheffield MD   On: 06/24/2020 20:15   MR ANGIO HEAD WO CONTRAST   Narrative   CLINICAL DATA:  Neuro deficit, acute stroke suspected  EXAM: MRI HEAD WITHOUT CONTRAST  MRA HEAD WITHOUT CONTRAST  TECHNIQUE: Multiplanar, multi-echo pulse sequences of the brain and surrounding structures were acquired without intravenous contrast. Angiographic images of the Circle of Willis were acquired using MRA technique without intravenous contrast.  COMPARISON:  And a CT code stroke. MRI head June 23, 2019. CTA June 30 2019.  FINDINGS: MRI HEAD FINDINGS  Brain: Punctate focus of restricted diffusion in the high left frontal cortex (series 3, image 38). No significant edema or mass effect. Additional punctate areas of faint DWI signal in bilateral precentral gyri (series 3, images 37 and 39). Redemonstrated multiple remote cortical infarcts in bilateral frontal parietal and occipital lobes. Multiple bilateral cerebellar lacunar infarcts. Moderate scattered patchy confluent T2/FLAIR hyperintensities within the white matter most likely related to chronic microvascular ischemic disease. No hydrocephalus. Age advanced moderate cerebral atrophy with ex vacuo ventricular dilation. No acute hemorrhage. No mass lesion or abnormal mass effect. No extra-axial fluid collection.  Vascular:  See below.  Skull and upper cervical spine: Normal marrow signal.  Sinuses/Orbits: Clear sinuses.  Unremarkable orbits.  Other: No sizable mastoid effusions.  MRA HEAD FINDINGS  Anterior circulation: Bilateral ICAs, MCAs, and ACAs are patent without flow-limiting proximal stenosis. No aneurysm identified.  Posterior circulation: The visualized intradural vertebral arteries, basilar artery and posterior cerebral arteries are patent without flow limiting proximal stenosis. No aneurysm identified.  IMPRESSION: MRI:  1. Punctate acute infarct in the high  left frontal cortex. Additional faint punctate areas of DWI signal in bilateral precentral gyri are suspicious for additional acute or subacute infarcts, but could potentially represent artifact. Given potential involvement of multiple vascular territories, consider a central embolic etiology. 2. 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 3. Moderate atrophy.  MRA:  No large vessel occlusion or proximal flow limiting stenosis.   Electronically Signed   By: Margaretha Sheffield MD   On: 06/24/2020 20:15   Results for orders placed or performed during the hospital encounter of 06/23/19  MR BRAIN W WO CONTRAST   Narrative   [facility]  NEUROIMAGING REPORT   STUDY DATE: 06/23/19 PATIENT NAME: Jacqueline Bonilla DOB: 05/09/1955 MRN: [id number]  ORDERING CLINICIAN: [name], MD  CLINICAL HISTORY: 65 year old female with numbness.  EXAM: MR BRAIN W WO CONTRAST  TECHNIQUE: MRI of the brain with and without contrast was obtained  utilizing 5 mm axial slices with T1, T2, T2 flair, SWI and diffusion  weighted views.  T1 sagittal, T2 coronal and postcontrast views in the  axial and coronal plane were obtained. CONTRAST: 48ml multihance  COMPARISON: none  IMAGING SITE: [facility] [address] (1.5 Tesla MRI)    FINDINGS:   Scattered bilateral periventricular, subcortical and juxtacortical DWI  hyperintensities in the bilateral cerebral hemispheres.  Some of these are  hypointense on ADC maps.  Moderate periventricular and subcortical and  cortical foci of T2FLAIR hyperintensities, consistent with chronic  ischemic disease.   No abnormal lesions are seen on diffusion-weighted views to suggest acute  ischemia. The cortical sulci, fissures and cisterns are normal in size and  appearance. Lateral, third and fourth ventricle are normal in size and   appearance. No extra-axial fluid collections are seen. No evidence of mass  effect or midline shift.  No abnormal lesions are seen on post contrast  views.    On sagittal views the posterior fossa, pituitary gland and corpus callosum  are unremarkable. No evidence of intracranial hemorrhage on SWI views. The  orbits and their contents, paranasal sinuses and calvarium are  unremarkable.  Intracranial flow voids are present.     Impression   MRI brain (with and without) demonstrating: -Scattered acute, subacute and chronic ischemic infarcts in the bilateral  cerebral hemispheres. Pattern suggests a proximal / cardioembolic source.  -Moderate chronic small vessel ischemic disease.      INTERPRETING PHYSICIAN:  Penni Bombard, MD Certified in Neurology, Neurophysiology and Neuroimaging  [facility] [address] [place], [place] [address] [phone]        PHYSICAL EXAM Pleasant middle-aged Caucasian lady not in distress. . Afebrile. Head is nontraumatic. Neck is supple without bruit.    Cardiac exam no murmur or gallop. Lungs are clear to auscultation. Distal pulses are well felt.  Temp:  [97.6 F (36.4 C)-98.5 F (36.9 C)] 98.3 F (36.8 C) (12/08 1108) Pulse Rate:  [72-91] 74 (12/08 1108)  Resp:  [9-18] 18 (12/08 1108) BP: (106-132)/(59-77) 113/75 (12/08 1108) SpO2:  [92 %-100 %] 100 % (12/08 1108)  General:  Patient is an alert, well-developed patient in no acute distress   NEURO:  Mental Status: AA&Ox4 Speech/Language: speech is without dysarthria or aphasia.    Cranial Nerves:  II: PERRL. Visual fields full.  III, IV, VI: EOMI. Eyelids elevate symmetrically.  V: Sensation is intact to light touch and symmetrical to face.  VII: Slight right facial droop present VIII: hearing intact to voice. IX, X: Phonation is normal.  XII: tongue is midline without fasciculations. Motor: 5/5 strength to LUE, LLE an RLE, 4/5 to RUE Tone:  is normal and bulk is normal Sensation- Intact to light touch bilaterally. Extinction absent to light touch to DSS.  Coordination: Slight drift in RUE.  Gait- deferred   ASSESSMENT/PLAN Jacqueline Bonilla is a 65 y.o. female with history of anemia, anxiety, depression, two prior strokes, HTN, loop recorder implant in 10/2019, osteoporosis, and thrombocytopenia presenting with acute onset of aphasia and worsened right sided incoordination relative to her baseline deficit. On arrival to the ED the patient was with dense receptive aphasia and mild extensor posturing of her RUE with mild RUE and RLE weakness. Etiology for patient stroke unclear, still consistent with embolic source.  However loop recorder so far negative for A. fib.  Once stable, may consider further cardioembolic work-up with TEE, may consider empiric anticoagulation treatment.  Currently patient has small SAH from procedure, will start aspirin 81 at this time.  Continue home Lipitor 40.  Patient failed swallow, will put on core track and start tube feeding n.p.o. meds.  Given seizure activity overnight, and EEG left spikes, will continue Keppra 500 twice daily.  BP goal less than 160 given SAH postprocedure.  Will obtain TEE before patient is discharged to rehab.  Stroke:  left distal M2/M3 occlusion s/p TNK and IR, complicated by small SAH with TNK reversal with TXA, etiology recurrent cryptogenic stroke with negative prior work-up CT head - Unchanged subarachnoid hyperdensity over both hemispheres, left-greater-than-right, likely a combination contrast extravasation and subarachnoid blood.  Unchanged subcortical intraparenchymal blood in the left parietal lobe. MRI head Scattered small acute infarcts in the left MCA distribution as above. Subarachnoid hemorrhage overlying the bilateral cerebral hemispheres, not significantly changed compared to the prior CT allowing for difference in modality. The small focus of intraparenchymal  hemorrhage in the left parietal lobe is also unchanged.  MRA head  Interval recanalization of the previously occluded left inferior M2 division. No high-grade stenosis or occlusion on the current study. IR - Successful mechanical thrombectomy, small left sylvian subarachnoid hemorrhage vs contrast extravasation on postprocedural flat panel head ct 2D Echo   ejection fraction 55 to 60%.  No cardiac source of embolism. EEG-  evidence of cortical dysfunction and an epileptogenicity arising from left frontotemporal region, likely secondary to underlying structural abnormality.  No seizures  were seen throughout the recording. October 2021 Loop Recorder- negative for Afib TEE- 12/8- Mild MR, Normal LV EF 60%, No LAA thrombus, No ASD/PFO, Negative bubble study, Normal AV, Normal Aortic root, No effusion , No source of embolus Venous Duplex-no evidence of DVT  LDL 89 HgbA1c 5.7 SCDs for VTE prophylaxis aspirin 81 mg daily prior to admission, now on aspirin 325 mg daily. Hold plavix due to [facility] Patient counseled to be compliant with her antithrombotic medications Ongoing aggressive stroke risk factor management Therapy recommendations: Acute inpatient rehab Disposition:  Pending  Previous  Stroke- June 2022 Cryptogenic stroke with loop recorder placement MRI/MRA brain done revealing punctate infarct and high "report except faint areas of DWI signal in bilateral precentral gyri question acute/subacute infarct or artifact.  Stroke felt to be embolic recorder was interrogated with negative for A. Fib   Possible Seizure activity Left facial twitching with altered mental status Ativan overnight EEG done 12/02/2020 showed left spikes Loaded with keppra and continue keppra BID  Hypertension Stable  BP goal < 160 given SAH post procedure  Long-term BP goal normotensive Home medications losartan Cleviprex d/c, managed well with home medications and prns  Hyperlipidemia Home meds:  None LDL 89, goal  < 70 Lipitor 40mg  Continue statin at discharge  Diabetes type II HgbA1c 5.7, goal < 7.0 Controlled  Dysphagia SLP consult complete Recommend regular diet with thin liquids Meds whole with liquid Turn off tube feeds and monitor oral intake- d/c cortrak  Other Stroke Risk Factors Advanced age  Other Active Problems Vascular Dementia Altered Mental Status- improved Frequent reorientation Sleep hygiene  Hospital day # 7  Patient seen and examined by NP/APP with MD. MD to update note as needed.   Janine Ores, DNP, FNP-BC [facility] Pager: [phone]  I have personally obtained history,examined this patient, reviewed notes, independently viewed imaging studies, participated in medical decision making and plan of care.ROS completed by me personally and pertinent positives fully documented  I have made any additions or clarifications directly to the above note. Agree with note above.  TEE done this morning was unremarkable.  Patient has signed consent to screen for [place] stroke prevention study and will undergo screening EKG and lab work for BNP.  Transfer to inpatient rehab when bed available.  Long discussion patient and husband and answered questions.  Antony Contras, MD Medical Director [facility] Pager: [phone] 12/08/2020 3:13 PM

## 2020-12-08 NOTE — Progress Notes (Addendum)
Inpatient Rehabilitation Admission Medication Review by a Pharmacist  A complete drug regimen review was completed for this patient to identify any potential clinically significant medication issues.  High Risk Drug Classes Is patient taking? Indication by Medication  Antipsychotic No   Anticoagulant No   Antibiotic No   Opioid No   Antiplatelet Yes Baby aspirin/Brilinta- CVA prophylaxis  Hypoglycemics/insulin No   Vasoactive Medication Yes Losartan, flomax- Hypertension, BPH  Chemotherapy No   Other Yes Lipitor- HLD Bethanecol- urinary retention Vitamin D- supplement Multivitamin- supplement Thiamine- supplement Keppra- seizures Protonix- GERD     Type of Medication Issue Identified Description of Issue Recommendation(s)  Drug Interaction(s) (clinically significant)     Duplicate Therapy     Allergy     No Medication Administration End Date     Incorrect Dose     Additional Drug Therapy Needed     Significant med changes from prior encounter (inform family/care partners about these prior to discharge).    Other  PTA meds: melatonin, zinc sulfate Restart home meds/supplements as needed or s/p discharge from CIR    Clinically significant medication issues were identified that warrant physician communication and completion of prescribed/recommended actions by midnight of the next day:  No   Time spent performing this drug regimen review (minutes):  30  Pharmacist note: nattokinase is a fibrinolytic. Given current SAH, I would recommend discouraging use upon discharge  Benjerman Molinelli BS, PharmD, BCPS Clinical Pharmacist 12/08/2020 2:55 PM

## 2020-12-08 NOTE — CV Procedure (Signed)
TEE: Anesthesia: Propofol  Mild MR Normal LV EF 60% No LAA thrombus No ASD/PFO Negative bubble study Normal AV Normal Aortic root No effusion   No source of embolus  Jenkins Rouge MD [facility]

## 2020-12-09 DIAGNOSIS — I63512 Cerebral infarction due to unspecified occlusion or stenosis of left middle cerebral artery: Secondary | ICD-10-CM | POA: Diagnosis not present

## 2020-12-09 LAB — CBC WITH DIFFERENTIAL/PLATELET
Abs Immature Granulocytes: 0.03 10*3/uL (ref 0.00–0.07)
Basophils Absolute: 0.1 10*3/uL (ref 0.0–0.1)
Basophils Relative: 1 %
Eosinophils Absolute: 0.2 10*3/uL (ref 0.0–0.5)
Eosinophils Relative: 4 %
HCT: 31.6 % — ABNORMAL LOW (ref 36.0–46.0)
Hemoglobin: 10.6 g/dL — ABNORMAL LOW (ref 12.0–15.0)
Immature Granulocytes: 1 %
Lymphocytes Relative: 22 %
Lymphs Abs: 1.2 10*3/uL (ref 0.7–4.0)
MCH: 30.4 pg (ref 26.0–34.0)
MCHC: 33.5 g/dL (ref 30.0–36.0)
MCV: 90.5 fL (ref 80.0–100.0)
Monocytes Absolute: 0.7 10*3/uL (ref 0.1–1.0)
Monocytes Relative: 12 %
Neutro Abs: 3.2 10*3/uL (ref 1.7–7.7)
Neutrophils Relative %: 60 %
Platelets: 230 10*3/uL (ref 150–400)
RBC: 3.49 MIL/uL — ABNORMAL LOW (ref 3.87–5.11)
RDW: 12.7 % (ref 11.5–15.5)
WBC: 5.4 10*3/uL (ref 4.0–10.5)
nRBC: 0 % (ref 0.0–0.2)

## 2020-12-09 LAB — COMPREHENSIVE METABOLIC PANEL
ALT: 14 U/L (ref 0–44)
AST: 18 U/L (ref 15–41)
Albumin: 3.1 g/dL — ABNORMAL LOW (ref 3.5–5.0)
Alkaline Phosphatase: 50 U/L (ref 38–126)
Anion gap: 9 (ref 5–15)
BUN: 20 mg/dL (ref 8–23)
CO2: 26 mmol/L (ref 22–32)
Calcium: 8.8 mg/dL — ABNORMAL LOW (ref 8.9–10.3)
Chloride: 100 mmol/L (ref 98–111)
Creatinine, Ser: 0.89 mg/dL (ref 0.44–1.00)
GFR, Estimated: >60 mL/min (ref >60)
Glucose, Bld: 103 mg/dL — ABNORMAL HIGH (ref 70–99)
Potassium: 4 mmol/L (ref 3.5–5.1)
Sodium: 135 mmol/L (ref 135–145)
Total Bilirubin: 0.6 mg/dL (ref 0.3–1.2)
Total Protein: 6.3 g/dL — ABNORMAL LOW (ref 6.5–8.1)

## 2020-12-09 MED ORDER — HYDROCORTISONE 1 % EX CREA
TOPICAL_CREAM | Freq: Two times a day (BID) | CUTANEOUS | Status: DC
Start: 2020-12-09 — End: 2020-12-17
  Administered 2020-12-09: 1 via TOPICAL
  Filled 2020-12-09: qty 28

## 2020-12-09 MED ORDER — LOSARTAN POTASSIUM 25 MG PO TABS
12.5000 mg | ORAL_TABLET | Freq: Every day | ORAL | Status: DC
  Administered 2020-12-10 – 2020-12-17 (×8): 12.5 mg via ORAL
  Filled 2020-12-09 (×8): qty 0.5

## 2020-12-09 NOTE — Evaluation (Signed)
Physical Therapy Assessment and Plan  Jacqueline Bonilla Details  Name: Jacqueline Bonilla MRN: [id number] Date of Birth: 1955/11/14  PT Diagnosis: Abnormal posture, Abnormality of gait, Ataxia, Cognitive deficits, Hemiparesis dominant, and Muscle weakness Rehab Potential: Good ELOS: 7-10 days   Today's Date: 12/09/2020 PT Individual Time: 1300-1400 PT Individual Time Calculation (min): 60 min    Hospital Problem: Principal Problem:   Left middle cerebral artery stroke [facility])   Past Medical History:  Past Medical History:  Diagnosis Date   Anemia    Anxiety    Depression    Dysmenorrhea    Endometriosis    Fibroid    Hypertension    Microhematuria    negative workup   Osteoporosis    Tachycardia    Thrombocytopenia (HCC)    Past Surgical History:  Past Surgical History:  Procedure Laterality Date   BREAST BIOPSY     CESAREAN SECTION     hysteroscopic resection     implantable loop recorder implant  10/21/2019   [employer] Reveal Linq model LNQ 22 ([place] [id number]) implantable loop recorder   IR CT HEAD LTD  12/01/2020   IR PERCUTANEOUS ART THROMBECTOMY/INFUSION INTRACRANIAL INC DIAG ANGIO  12/01/2020   IR US GUIDE VASC ACCESS RIGHT  12/01/2020   RADIOLOGY WITH ANESTHESIA N/A 12/01/2020   Procedure: IR WITH ANESTHESIA - CODE STROKE;  Surgeon: Radiologist, Medication, MD;  Location: [place];  Service: Radiology;  Laterality: N/A;    Assessment & Plan Clinical Impression: Jacqueline Bonilla is a 65 y.o. year old female with  history of anxiety/depression, chronic anemia, 2 prior strokes in the past 14 months and did receive inpatient rehab services 06/28/2020 - 07/28/2020 1 presented with right side weakness followed by improvement to mild residual deficits the second with left side weakness and maintained on aspirin, loop recorder implant October 2021, hypertension, osteoporosis, thrombocytopenia, vascular dementia.  Per chart review Jacqueline Bonilla lives with spouse.  1 level home 3 steps to entry.  Modified  independent and currently not driving.  Presented 12/01/2020 with acute onset of aphasia and right side incoordination relative to her baseline deficit.  Initial cranial CT scan negative for acute changes.  CT angiogram head and neck occlusion of distal left M2 MCA branch at the posterior aspect of the sylvian fissure.  No hemodynamically significant stenosis in the neck.  Jacqueline Bonilla underwent successful mechanical thrombectomy  per interventional radiology and a follow-up cranial CT scan showed subarachnoid hyperdensity over both hemispheres, left greater than right likely combination contrast extravasation and subarachnoid blood.  EEG 12/02/2020 showed no seizure however there was some evidence of cortical dysfunction and epileptogenicity arising from the left frontal parietal region and Jacqueline Bonilla was loaded with Keppra.  MRI/MRA completed 12/02/2020 scattered small acute infarcts in the left MCA distribution with also noted subarachnoid hemorrhage overlying the bilateral cerebral hemispheres not significantly changed from prior cranial CT scan.  Neurology follow-up currently maintained on low-dose aspirin therapy and Brilinta was later initiated.  Echocardiogram with ejection fraction of 55 to 60% no wall motion abnormalities.  TEE showed ejection fraction 60% without thrombus or PFO.  No source of embolus..  Tolerating a regular diet.  Bouts of urinary retention placed on low-dose Urecholine as well as Flomax.  Therapy evaluations completed due to Jacqueline Bonilla's aphasia and right-sided incoordination was admitted for a comprehensive rehab program.    Jacqueline Bonilla currently requires min with mobility secondary to muscle weakness, decreased cardiorespiratoy endurance, decreased motor planning and ideational apraxia, decreased initiation, decreased attention, decreased awareness, decreased problem solving,  decreased safety awareness, decreased memory, and delayed processing, and decreased sitting balance, decreased standing  balance, decreased postural control, hemiplegia, and decreased balance strategies.  Prior to hospitalization, Jacqueline Bonilla was independent  with mobility and lived with Spouse in a House home.  Home access is 3Stairs to enter.  Jacqueline Bonilla will benefit from skilled PT intervention to maximize safe functional mobility, minimize fall risk, and decrease caregiver burden for planned discharge home with 24 hour supervision.  Anticipate Jacqueline Bonilla will benefit from follow up OP at discharge.  Jacqueline Bonilla received reclined in bed, agreeable to PT. She reports 1/10 pain in low back, premedicated. PT providing rest breaks, distractions and repositioning to assist with pain management. Husband reports that Jacqueline Bonilla has double vision- Jacqueline Bonilla denies this. This PT did not observe behavior consistent with having double vision. She requires grossly CGA/MinA for all mobility and verbal cues for motor planning/sequencing tasks. Jacqueline Bonilla with increased apraxia in R UE> L UE. Poor balance strategies noted with excessive posterior stepping in attempts to regain balance, but not successful. When transferring into car simulator, Jacqueline Bonilla attempting to climb on top of car as opposed to sitting into it. Max cues needed to intervene for safety. Jacqueline Bonilla with continent void at end of session. Returning to bed. Bed alarm on, call light within reach.   PT - End of Session Activity Tolerance: Tolerates 30+ min activity with multiple rests Endurance Deficit: Yes PT Assessment Rehab Potential (ACUTE/IP ONLY): Good PT Barriers to Discharge: Other (comments) PT Barriers to Discharge Comments: h/o multiple strokes PT Jacqueline Bonilla demonstrates impairments in the following area(s): Balance;Behavior;Endurance;Motor;Nutrition;Pain;Safety;Perception;Sensory;Skin Integrity PT Transfers Functional Problem(s): Bed Mobility;Bed to Chair;Car;Furniture PT Locomotion Functional Problem(s): Ambulation;Stairs PT Plan PT Intensity: Minimum of 1-2 x/day ,45 to 90  minutes PT Frequency: 5 out of 7 days PT Duration Estimated Length of Stay: 7-10 days PT Treatment/Interventions: Ambulation/gait training;Discharge planning;Functional mobility training;Psychosocial support;Therapeutic Activities;Visual/perceptual remediation/compensation;Balance/vestibular training;Disease management/prevention;Neuromuscular re-education;Skin care/wound management;Therapeutic Exercise;Wheelchair propulsion/positioning;Cognitive remediation/compensation;DME/adaptive equipment instruction;Pain management;Splinting/orthotics;UE/LE Strength taining/ROM;Community reintegration;Functional electrical stimulation;Jacqueline Bonilla/family education;Stair training;UE/LE Coordination activities PT Transfers Anticipated Outcome(s): spv PT Locomotion Anticipated Outcome(s): spv PT Recommendation Recommendations for Other Services: Therapeutic Recreation consult Therapeutic Recreation Interventions: Stress management Follow Up Recommendations: Home health PT;Outpatient PT;24 hour supervision/assistance Jacqueline Bonilla destination: Home Equipment Recommended: To be determined   PT Evaluation Precautions/Restrictions Precautions Precautions: Fall;Other (comment) Precaution Comments: apraxic, ataxic Restrictions Weight Bearing Restrictions: No General   Vital SignsTherapy Vitals Temp: 98 F (36.7 C) Temp Source: Oral Pulse Rate: 91 Resp: 16 BP: 106/61 Jacqueline Bonilla Position (if appropriate): Sitting Oxygen Therapy SpO2: 97 % O2 Device: Room Air Pain Pain Assessment Pain Scale: 0-10 Pain Score: 1  Pain Type: Chronic pain Pain Location: Back Pain Interference Pain Interference Pain Effect on Sleep: 1. Rarely or not at all Pain Interference with Therapy Activities: 1. Rarely or not at all Pain Interference with Day-to-Day Activities: 1. Rarely or not at all Home Living/Prior [place]: Spouse/significant other Available Help at Discharge:  Family;Friend(s);Available 24 hours/day Type of Home: House Home Access: Stairs to enter [employer] of Steps: 3 Entrance Stairs-Rails: Left Home Layout: One level Bathroom Shower/Tub: Multimedia programmer: Handicapped height Bathroom Accessibility: Yes  Lives With: Spouse Prior Function Level of Independence: Independent with basic ADLs;Independent with gait;Independent with transfers;Independent with homemaking with ambulation  Able to Take Stairs?: Yes Driving: No (practicing driving with husband) Vision/Perception  Vision - History Ability to See in Adequate Light: 0 Adequate Vision - Assessment Tracking/Visual Pursuits: Decreased smoothness of horizontal tracking;Decreased smoothness of vertical tracking Saccades: Additional  head turns occurred during testing;Additional eye shifts occurred during testing;Impaired - to be further tested in functional context;Decreased speed of saccadic movement Perception Perception: Impaired Praxis Praxis: Impaired Praxis Impairment Details: Motor planning;Ideomotor  Cognition Overall Cognitive Status: History of cognitive impairments - at baseline Arousal/Alertness: Awake/alert Memory: Impaired Awareness: Impaired Problem Solving: Impaired Safety/Judgment: Impaired Sensation Sensation Light Touch: Impaired by gross assessment Hot/Cold: Not tested (reports decreased sensationin B UE) Proprioception: Appears Intact Stereognosis: Appears Intact Coordination Gross Motor Movements are Fluid and Coordinated: No Fine Motor Movements are Fluid and Coordinated: No Finger Nose Finger Test: dysmetric Heel Shin Test: unable to motor plan L LE Motor  Motor Motor: Hemiplegia;Abnormal postural alignment and control Motor - Skilled Clinical Observations: apraxia/ataxia, slight R hemi   Trunk/Postural Assessment  Cervical Assessment Cervical Assessment: Exceptions to [facility] Thoracic Assessment Thoracic Assessment: Exceptions  to [facility] Lumbar Assessment Lumbar Assessment: Exceptions to [facility] Postural Control Postural Control: Deficits on evaluation Righting Reactions: delayed and inadequate Protective Responses: delayed and inadequate  Balance Balance Balance Assessed: Yes Standardized Balance Assessment Standardized Balance Assessment: (P) Berg Balance Test (6MWT: 820' CGA) Berg Balance Test Sit to Stand: Able to stand  independently using hands Standing Unsupported: Able to stand 2 minutes with supervision Sitting with Back Unsupported but Feet Supported on Floor or Stool: Able to sit 2 minutes under supervision Stand to Sit: Uses backs of legs against chair to control descent Transfers: Needs one person to assist Standing Unsupported with Eyes Closed: Able to stand 10 seconds with supervision Standing Ubsupported with Feet Together: Needs help to attain position but able to stand for 30 seconds with feet together From Standing, Reach Forward with Outstretched Arm: Reaches forward but needs supervision From Standing Position, Pick up Object from Floor: Able to pick up shoe, needs supervision From Standing Position, Turn to Look Behind Over each Shoulder: Needs assist to keep from losing balance and falling Turn 360 Degrees: Needs close supervision or verbal cueing Standing Unsupported, Alternately Place Feet on Step/Stool: Needs assistance to keep from falling or unable to try Standing Unsupported, One Foot in Front: Loses balance while stepping or standing Standing on One Leg: Tries to lift leg/unable to hold 3 seconds but remains standing independently Total Score: 22 Static Sitting Balance Static Sitting - Balance Support: Feet supported Static Sitting - Level of Assistance: 5: Stand by assistance Dynamic Sitting Balance Dynamic Sitting - Balance Support: Feet supported;During functional activity Dynamic Sitting - Level of Assistance: 5: Stand by assistance Static Standing Balance Static Standing -  Balance Support: No upper extremity supported Static Standing - Level of Assistance: 4: Min assist Dynamic Standing Balance Dynamic Standing - Balance Support: During functional activity Dynamic Standing - Level of Assistance: 4: Min assist Extremity Assessment      RLE Assessment RLE Assessment: Exceptions to [facility] General Strength Comments: Grossly 4/5 LLE Assessment LLE Assessment: Exceptions to [facility] General Strength Comments: Grossly 4/5  Care Tool Care Tool Bed Mobility Roll left and right activity   Roll left and right assist level: Supervision/Verbal cueing    Sit to lying activity   Sit to lying assist level: Contact Guard/Touching assist    Lying to sitting on side of bed activity   Lying to sitting on side of bed assist level: the ability to move from lying on the back to sitting on the side of the bed with no back support.: Contact Guard/Touching assist     Care Tool Transfers Sit to stand transfer   Sit to stand assist  level: Minimal Assistance - Jacqueline Bonilla > 75%    Chair/bed transfer   Chair/bed transfer assist level: Minimal Assistance - Jacqueline Bonilla > 75%     Toilet transfer   Assist Level: Minimal Assistance - Jacqueline Bonilla > 75%    Car transfer   Car transfer assist level: Minimal Assistance - Jacqueline Bonilla > 75%      Care Tool Locomotion Ambulation   Assist level: Minimal Assistance - Jacqueline Bonilla > 75% Assistive device: Hand held assist Max distance: 200  Walk 10 feet activity   Assist level: Contact Guard/Touching assist Assistive device: Hand held assist   Walk 50 feet with 2 turns activity   Assist level: Minimal Assistance - Jacqueline Bonilla > 75% Assistive device: Hand held assist  Walk 150 feet activity   Assist level: Minimal Assistance - Jacqueline Bonilla > 75% Assistive device: Hand held assist  Walk 10 feet on uneven surfaces activity   Assist level: Minimal Assistance - Jacqueline Bonilla > 75% Assistive device: Hand held assist  Stairs   Assist level: Minimal Assistance - Jacqueline Bonilla >  75% Stairs assistive device: 2 hand rails Max number of stairs: 12  Walk up/down 1 step activity   Walk up/down 1 step (curb) assist level: Minimal Assistance - Jacqueline Bonilla > 75% Walk up/down 1 step or curb assistive device: 2 hand rails  Walk up/down 4 steps activity   Walk up/down 4 steps assist level: Minimal Assistance - Jacqueline Bonilla > 75% Walk up/down 4 steps assistive device: 2 hand rails  Walk up/down 12 steps activity   Walk up/down 12 steps assist level: Minimal Assistance - Jacqueline Bonilla > 75% Walk up/down 12 steps assistive device: 2 hand rails  Pick up small objects from floor   Pick up small object from the floor assist level: Minimal Assistance - Jacqueline Bonilla > 75%    Wheelchair Is the Jacqueline Bonilla using a wheelchair?: No          Wheel 50 feet with 2 turns activity      Wheel 150 feet activity        Refer to Care Plan for Long Term Goals  SHORT TERM GOAL WEEK 1 PT Short Term Goal 1 (Week 1): STG= LTG based on ELOS  Recommendations for other services: Therapeutic Recreation  Stress management  Skilled Therapeutic Intervention Mobility Bed Mobility Bed Mobility: Rolling Right;Rolling Left;Sit to Supine;Supine to Sit Rolling Right: Supervision/verbal cueing Rolling Left: Supervision/Verbal cueing Supine to Sit: Supervision/Verbal cueing Sit to Supine: Supervision/Verbal cueing Transfers Transfers: Sit to Stand;Stand to Sit;Stand Pivot Transfers Sit to Stand: Contact Guard/Touching assist Stand to Sit: Contact Guard/Touching assist Stand Pivot Transfers: Contact Guard/Touching assist Transfer (Assistive device): 1 person hand held assist Locomotion  Gait Ambulation: Yes Gait Assistance: Contact Guard/Touching assist Gait Distance (Feet): 200 Feet Assistive device: 1 person hand held assist Gait Gait: Yes Gait Pattern: Impaired Gait Pattern: Step-through pattern;Wide base of support;Right steppage;Left steppage Gait velocity: decreased Stairs / Additional  Locomotion Stairs: Yes Stairs Assistance: Minimal Assistance - Jacqueline Bonilla > 75% Stair Management Technique: Two rails Number of Stairs: 12 Height of Stairs: 6 Wheelchair Mobility Wheelchair Mobility: No   Discharge Criteria: Jacqueline Bonilla will be discharged from PT if Jacqueline Bonilla refuses treatment 3 consecutive times without medical reason, if treatment goals not met, if there is a change in medical status, if Jacqueline Bonilla makes no progress towards goals or if Jacqueline Bonilla is discharged from hospital.  The above assessment, treatment plan, treatment alternatives and goals were discussed and mutually agreed upon: by Jacqueline Bonilla  Debbora Dus 12/09/2020, 2:45 PM

## 2020-12-09 NOTE — Evaluation (Signed)
Speech Language Pathology Assessment and Plan  Patient Details  Name: Jacqueline Bonilla MRN: [id number] Date of Birth: 01-02-1955  SLP Diagnosis: Cognitive Impairments;Speech and Language deficits;Aphasia  Rehab Potential: Good ELOS: ~1.5 weeks   Today's Date: 12/09/2020 SLP Individual Time: 0900-1000 SLP Individual Time Calculation (min): 60 min  Hospital Problem: Principal Problem:   Left middle cerebral artery stroke [facility])  Past Medical History:  Past Medical History:  Diagnosis Date   Anemia    Anxiety    Depression    Dysmenorrhea    Endometriosis    Fibroid    Hypertension    Microhematuria    negative workup   Osteoporosis    Tachycardia    Thrombocytopenia (HCC)    Past Surgical History:  Past Surgical History:  Procedure Laterality Date   BREAST BIOPSY     CESAREAN SECTION     hysteroscopic resection     implantable loop recorder implant  10/21/2019   [employer] Reveal Linq model LNQ 22 ([place] [id number]) implantable loop recorder   IR CT HEAD LTD  12/01/2020   IR PERCUTANEOUS ART THROMBECTOMY/INFUSION INTRACRANIAL INC DIAG ANGIO  12/01/2020   IR US GUIDE VASC ACCESS RIGHT  12/01/2020   RADIOLOGY WITH ANESTHESIA N/A 12/01/2020   Procedure: IR WITH ANESTHESIA - CODE STROKE;  Surgeon: Radiologist, Medication, MD;  Location: [place];  Service: Radiology;  Laterality: N/A;    Assessment / Plan / Recommendation Clinical Impression Jacqueline Bonilla is a 65 year old right-handed female with history of anxiety/depression, chronic anemia, 2 prior strokes in the past 14 months and did receive inpatient rehab services 06/28/2020 - 07/28/2020 1 presented with right side weakness followed by improvement to mild residual deficits the second with left side weakness and maintained on aspirin, loop recorder implant October 2021, hypertension, osteoporosis, thrombocytopenia, vascular dementia.  Per chart review patient lives with spouse.  1 level home 3 steps to entry.  Modified  independent and currently not driving.  Presented 12/01/2020 with acute onset of aphasia and right side incoordination relative to her baseline deficit.  Initial cranial CT scan negative for acute changes.  CT angiogram head and neck occlusion of distal left M2 MCA branch at the posterior aspect of the sylvian fissure. MRI/MRA completed 12/02/2020 scattered small acute infarcts in the left MCA distribution with also noted subarachnoid hemorrhage overlying the bilateral cerebral hemispheres not significantly changed from prior cranial CT scan.    Patient is known to this service from prior admission following CVA in June 2022. Pt with mild cognitive impairment at baseline from prior CVAs. Patient presents with mild expressive language deficits at conversation level impacting verbal fluency at times, intermittent anomia, and decreased generative naming. Pt does not exhibit awareness of speech/language errors. Pt with mild receptive language deficits impacting ability to follow multi-step commands. Per Western Aphasia Battery Bedside (WAB-B) pt presented [facility] in all other tested areas. Pt was alert and oriented. She exhibited decreased working memory, short-term recall, complex problem solving, and intellectual awareness of deficits. Per functional deficits noted in June/July 2022 admission and spouse report, patient appears slightly below baseline from a cognitive standpoint with novel language deficits. Pt presents with mild right sided facial weakness however this did not negatively impact speech intelligibility and pt was perceived as 100% intelligible. Patient would benefit from skilled SLP intervention to maximize her cognitive-communication functioning and overall functional independence prior to discharge.    Skilled Therapeutic Interventions          Pt participated in [place]  Battery Bedside (WAB-Bedside) as well as further non-standardized assessments of cognitive-linguistic, speech, and language  function. Please see above.     SLP Assessment  Patient will need skilled [facility] Path[facility] Services during CIR admission    Recommendations  Patient destination: Home Follow up Recommendations: Outpatient SLP;24 hour supervision/assistance Equipment Recommended: None recommended by SLP    SLP Frequency 3 to 5 out of 7 days   SLP Duration  SLP Intensity  SLP Treatment/Interventions ~1.5 weeks  Minumum of 1-2 x/day, 30 to 90 minutes  Cognitive remediation/compensation;Environmental controls;Internal/external aids;Speech/Language facilitation;Cueing hierarchy;Functional tasks;Patient/family education;Therapeutic Activities    Pain    Prior Functioning Cognitive/Linguistic Baseline: Baseline deficits Baseline deficit details: Pt was seen for OP and ST services to address mild cognitive deficits in March and July of 2022 Type of Home: House  Lives With: Spouse Available Help at Discharge: Family;Friend(s);Available 24 hours/day  SLP Evaluation Cognition Overall Cognitive Status: History of cognitive impairments - at baseline Arousal/Alertness: Awake/alert Orientation Level: Oriented X4 Year: 2022 Month: December Day of Week: Correct Attention: Selective;Sustained;Focused Focused Attention: Appears intact Sustained Attention: Appears intact Selective Attention: Impaired Selective Attention Impairment: Verbal complex Memory: Impaired Memory Impairment: Storage deficit;Retrieval deficit Awareness: Impaired Awareness Impairment: Intellectual impairment Problem Solving: Impaired Problem Solving Impairment: Verbal complex Executive Function: Reasoning;Self Monitoring;Self Correcting Reasoning: Impaired Reasoning Impairment: Verbal complex Self Monitoring: Impaired Self Monitoring Impairment: Verbal complex Self Correcting: Impaired Self Correcting Impairment: Verbal complex Safety/Judgment: Impaired  Comprehension Auditory Comprehension Overall Auditory  Comprehension: Impaired Basic Biographical Questions: 76-100% accurate Basic Immediate Environment Questions: 75-100% accurate Commands: Impaired One Step Basic Commands: 75-100% accurate Two Step Basic Commands: 50-74% accurate Conversation: Simple Interfering Components: Attention;Processing speed;Working Marine scientist;Motor planning EffectiveTechniques: Extra processing time;Repetition;Visual/Gestural cues Visual Recognition/Discrimination Discrimination: Not tested Reading Comprehension Reading Status: Impaired Word level: Within functional limits Sentence Level: Within functional limits Paragraph Level: Not tested Functional Environmental (signs, name badge): Within functional limits Expression Expression Primary Mode of Expression: Verbal Verbal Expression Overall Verbal Expression: Impaired Level of Generative/Spontaneous Verbalization: Sentence Repetition: Impaired Level of Impairment: Phrase level Pragmatics: No impairment Written Expression Dominant Hand: Right Written Expression: Unable to assess (comment) (impaired dominant hand) Oral Motor Oral Motor/Sensory Function Overall Oral Motor/Sensory Function: Mild impairment Facial ROM: Reduced right Facial Symmetry: Abnormal symmetry right Facial Strength: Reduced right Lingual ROM: Within Functional Limits Lingual Symmetry: Within Functional Limits Lingual Strength: Within Functional Limits Lingual Sensation: Within Functional Limits Velum: Within Functional Limits Mandible: Within Functional Limits Motor Speech Overall Motor Speech: Appears within functional limits for tasks assessed Respiration: Within functional limits Phonation: Normal Articulation: Within functional limitis Intelligibility: Intelligible Motor Planning: Witnin functional limits Motor Speech Errors: Not applicable  Care Tool Care Tool Cognition Ability to hear (with hearing aid or hearing appliances if normally used Ability to hear (with  hearing aid or hearing appliances if normally used): 0. Adequate - no difficulty in normal conservation, social interaction, listening to TV   Expression of Ideas and Wants Expression of Ideas and Wants: 3. Some difficulty - exhibits some difficulty with expressing needs and ideas (e.g, some words or finishing thoughts) or speech is not clear   Understanding Verbal and Non-Verbal Content Understanding Verbal and Non-Verbal Content: 3. Usually understands - understands most conversations, but misses some part/intent of message. Requires cues at times to understand  Memory/Recall Ability Memory/Recall Ability : Location of own room;Current season;Staff names and faces;That he or she is in a hospital/hospital unit   Speech: Intelligibility: Intelligible  Short Term Goals: Week 1: SLP Short Term  Goal 1 (Week 1): Patient will utilize communication strategies to overcome word finding difficulty at conversation level with min A verbal cues. SLP Short Term Goal 2 (Week 1): Patient will complete mildly complex problem solving with min A verbal cues SLP Short Term Goal 3 (Week 1): Patient will demonstrate awareness to errors and ability to generate appropriate solutions with min A verbal cues SLP Short Term Goal 4 (Week 1): Patient will demonstrate use of compensatory memory strategies during functional tasks with min A verbal cues  Refer to Care Plan for Long Term Goals  Recommendations for other services: None   Discharge Criteria: Patient will be discharged from SLP if patient refuses treatment 3 consecutive times without medical reason, if treatment goals not met, if there is a change in medical status, if patient makes no progress towards goals or if patient is discharged from hospital.  The above assessment, treatment plan, treatment alternatives and goals were discussed and mutually agreed upon: by patient  [name] 12/09/2020, 5:06 PM

## 2020-12-09 NOTE — Progress Notes (Addendum)
PROGRESS NOTE   Subjective/Complaints:  Some low back pain  ROS- neg CP, SOB, N/V/D Objective:   ECHO TEE  Result Date: 12/08/2020    TRANSESOPHOGEAL ECHO REPORT   Patient Name:   Jacqueline Bonilla Tieu Date of Exam: 12/08/2020 Medical Rec #:  [id number]          Height:       64.0 in Accession #:    [id number]         Weight:       119.9 lb Date of Birth:  [birth year]          BSA:          1.574 m Patient Age:    65 years           BP:           138/80 mmHg Patient Gender: F                  HR:           114 bpm. Exam Location:  Inpatient Procedure: Transesophageal Echo Indications:    Stroke  History:        Patient has prior history of Echocardiogram examinations.                 Stroke.  Sonographer:    Philipp Deputy RDCS Referring Phys: [id number] [place]: The transesophogeal probe was passed without difficulty through the esophogus of the patient. Sedation performed by different physician. The patient's vital signs; including heart rate, blood pressure, and oxygen saturation; remained stable throughout the procedure. The patient developed no complications during the procedure. IMPRESSIONS  1. Left ventricular ejection fraction, by estimation, is 60 to 65%. The left ventricle has normal function. The left ventricle has no regional wall motion abnormalities.  2. Right ventricular systolic function is normal. The right ventricular size is normal.  3. No left atrial/left atrial appendage thrombus was detected.  4. The mitral valve is abnormal. Mild mitral valve regurgitation. No evidence of mitral stenosis.  5. The aortic valve is tricuspid. Aortic valve regurgitation is not visualized. No aortic stenosis is present.  6. The inferior vena cava is normal in size with greater than 50% respiratory variability, suggesting right atrial pressure of 3 mmHg.  7. Agitated saline contrast bubble study was negative, with no evidence of any  interatrial shunt. Conclusion(s)/Recommendation(s): Normal biventricular function without evidence of hemodynamically significant valvular heart disease. FINDINGS  Left Ventricle: Left ventricular ejection fraction, by estimation, is 60 to 65%. The left ventricle has normal function. The left ventricle has no regional wall motion abnormalities. The left ventricular internal cavity size was normal in size. There is  no left ventricular hypertrophy. Right Ventricle: The right ventricular size is normal. No increase in right ventricular wall thickness. Right ventricular systolic function is normal. Left Atrium: Left atrial size was normal in size. No left atrial/left atrial appendage thrombus was detected. Right Atrium: Right atrial size was normal in size. Pericardium: There is no evidence of pericardial effusion. Mitral Valve: The mitral valve is abnormal. There is mild thickening of the mitral valve leaflet(s). Mild mitral valve regurgitation. No evidence  of mitral valve stenosis. Tricuspid Valve: The tricuspid valve is normal in structure. Tricuspid valve regurgitation is not demonstrated. No evidence of tricuspid stenosis. Aortic Valve: The aortic valve is tricuspid. Aortic valve regurgitation is not visualized. No aortic stenosis is present. Pulmonic Valve: The pulmonic valve was normal in structure. Pulmonic valve regurgitation is not visualized. No evidence of pulmonic stenosis. Aorta: The aortic root is normal in size and structure. Venous: The inferior vena cava is normal in size with greater than 50% respiratory variability, suggesting right atrial pressure of 3 mmHg. IAS/Shunts: No atrial level shunt detected by color flow Doppler. Agitated saline contrast bubble study was negative, with no evidence of any interatrial shunt. There is no evidence of a patent foramen ovale. Jenkins Rouge MD Electronically signed by Jenkins Rouge MD Signature Date/Time: 12/08/2020/10:18:15 AM    Final    Recent Labs     12/09/20 0508  WBC 5.4  HGB 10.6*  HCT 31.6*  PLT 230   Recent Labs    12/09/20 0508  NA 135  K 4.0  CL 100  CO2 26  GLUCOSE 103*  BUN 20  CREATININE 0.89  CALCIUM 8.8*    Intake/Output Summary (Last 24 hours) at 12/09/2020 0903 Last data filed at 12/09/2020 0839 Gross per 24 hour  Intake 720 ml  Output --  Net 720 ml        Physical Exam: Vital Signs Blood pressure 106/72, pulse 81, temperature 98.4 F (36.9 C), temperature source Oral, resp. rate 17, height 5\' 4"  (1.626 m), weight 49.3 kg, last menstrual period 01/02/2003, SpO2 100 %.   General: No acute distress Mood and affect are appropriate Heart: Regular rate and rhythm no rubs murmurs or extra sounds Lungs: Clear to auscultation, breathing unlabored, no rales or wheezes Abdomen: Positive bowel sounds, soft nontender to palpation, nondistended Extremities: No clubbing, cyanosis, or edema Skin: No evidence of breakdown, no evidence of rash Neurologic: Cranial nerves II through XII intact, motor strength is 5/5 in bilateral deltoid, bicep, tricep, grip, hip flexor, knee extensors, ankle dorsiflexor and plantar flexor Sensory exam normal sensation to light touch and proprioception in bilateral upper and lower extremities Cerebellar exam normal finger to nose to finger as well as heel to shin in bilateral upper and lower extremities Musculoskeletal: Full range of motion in all 4 extremities. No joint swelling   Assessment/Plan: 1. Functional deficits which require 3+ hours per day of interdisciplinary therapy in a comprehensive inpatient rehab setting. Physiatrist is providing close team supervision and 24 hour management of active medical problems listed below. Physiatrist and rehab team continue to assess barriers to discharge/monitor patient progress toward functional and medical goals  Care Tool:  Bathing  Bathing activity did not occur: Safety/medical concerns           Bathing assist       Upper  Body Dressing/Undressing Upper body dressing Upper body dressing/undressing activity did not occur (including orthotics): Safety/medical concerns      Upper body assist      Lower Body Dressing/Undressing Lower body dressing    Lower body dressing activity did not occur: Safety/medical concerns       Lower body assist       Toileting Toileting    Toileting assist Assist for toileting: Maximal Assistance - Patient 25 - 49%     Transfers Chair/bed transfer  Transfers assist     Chair/bed transfer assist level: Maximal Assistance - Patient 25 - 49%     Locomotion Ambulation  Ambulation assist      Assist level: Maximal Assistance - Patient 25 - 49%       Walk 10 feet activity   Assist           Walk 50 feet activity   Assist           Walk 150 feet activity   Assist           Walk 10 feet on uneven surface  activity   Assist           Wheelchair     Assist               Wheelchair 50 feet with 2 turns activity    Assist            Wheelchair 150 feet activity     Assist          Blood pressure 106/72, pulse 81, temperature 98.4 F (36.9 C), temperature source Oral, resp. rate 17, height 5\' 4"  (1.626 m), weight 49.3 kg, last menstrual period 01/02/2003, SpO2 100 %.  Medical Problem List and Plan: 1.  Right side weakness and aphasia functional deficits secondary to left distal M2/M3 occlusion status post TNK status post revascularization complicated by small SAH with TN K reversal etiology recurrent cryptogenic shock/small acute infarct left MCA distribution as well as CVA x2 in the past, does have SVD              -patient may  shower             -ELOS/Goals: 14-16 days- supervision for PT,OT and SLP 2.  Antithrombotics: -DVT/anticoagulation:  Mechanical:  Antiembolism stockings, knee (TED hose) Bilateral lower extremities             -antiplatelet therapy: Aspirin 81 mg daily and Brilinta 90 mg  twice daily 3. Pain Management: Tylenol as needed 4. Mood: Provide emotional support             -antipsychotic agents: N/A 5. Neuropsych: This patient is capable of making decisions on her own behalf. 6. Skin/Wound Care: Routine skin checks 7. Fluids/Electrolytes/Nutrition: Routine in and outs with follow-up chemistries 8.  Seizure prophylaxis.  EEG negative.  Keppra 500 mg twice daily 9.  Hypertension.  Cozaar 25 mg daily.  Monitor with increased mobility Vitals:   12/08/20 1508 12/09/20 0403  BP: 108/74 106/72  Pulse: 81 81  Resp: 16 17  Temp: 98.5 F (36.9 C) 98.4 F (36.9 C)  SpO2: 100% 100%   Would avoid hypotension , reduced losartan  10.  Hyperlipidemia.  Lipitor 11.  History of thrombocytopenia.  Latest platelet count 208,000. 12.  Urinary retention.  d/ced foley Urecholine 5 mg 3 times daily, Flomax 0.4 mg daily.  Check PVR's.     LOS: 1 days A FACE TO FACE EVALUATION WAS PERFORMED  Charlett Blake 12/09/2020, 9:03 AM

## 2020-12-09 NOTE — Plan of Care (Signed)
  Problem: RH Expression Communication Goal: LTG Patient will increase word finding of common (SLP) Description: LTG:  Patient will increase word finding of common objects/daily info/abstract thoughts with cues using compensatory strategies (SLP). Flowsheets (Taken 12/09/2020 1718) LTG: Patient will increase word finding of common (SLP): Supervision   Problem: RH Problem Solving Goal: LTG Patient will demonstrate problem solving for (SLP) Description: LTG:  Patient will demonstrate problem solving for basic/complex daily situations with cues  (SLP) Flowsheets (Taken 12/09/2020 1718) LTG Patient will demonstrate problem solving for: Supervision   Problem: RH Memory Goal: LTG Patient will use memory compensatory aids to (SLP) Description: LTG:  Patient will use memory compensatory aids to recall biographical/new, daily complex information with cues (SLP) Flowsheets (Taken 12/09/2020 1718) LTG: Patient will use memory compensatory aids to (SLP): Supervision   Problem: RH Awareness Goal: LTG: Patient will demonstrate awareness during functional activites type of (SLP) Description: LTG: Patient will demonstrate awareness during functional activites type of (SLP) Flowsheets (Taken 12/09/2020 1718) Patient will demonstrate during cognitive/linguistic activities awareness type of: Emergent LTG: Patient will demonstrate awareness during cognitive/linguistic activities with assistance of (SLP): Supervision

## 2020-12-09 NOTE — Plan of Care (Signed)
  Problem: RH Balance Goal: LTG Patient will maintain dynamic sitting balance (PT) Description: LTG:  Patient will maintain dynamic sitting balance with assistance during mobility activities (PT) Flowsheets (Taken 12/09/2020 1439) LTG: Pt will maintain dynamic sitting balance during mobility activities with:: Supervision/Verbal cueing Goal: LTG Patient will maintain dynamic standing balance (PT) Description: LTG:  Patient will maintain dynamic standing balance with assistance during mobility activities (PT) Flowsheets (Taken 12/09/2020 1439) LTG: Pt will maintain dynamic standing balance during mobility activities with:: Supervision/Verbal cueing   Problem: Sit to Stand Goal: LTG:  Patient will perform sit to stand with assistance level (PT) Description: LTG:  Patient will perform sit to stand with assistance level (PT) Flowsheets (Taken 12/09/2020 1439) LTG: PT will perform sit to stand in preparation for functional mobility with assistance level: Supervision/Verbal cueing   Problem: RH Bed Mobility Goal: LTG Patient will perform bed mobility with assist (PT) Description: LTG: Patient will perform bed mobility with assistance, with/without cues (PT). Flowsheets (Taken 12/09/2020 1439) LTG: Pt will perform bed mobility with assistance level of: Supervision/Verbal cueing   Problem: RH Bed to Chair Transfers Goal: LTG Patient will perform bed/chair transfers w/assist (PT) Description: LTG: Patient will perform bed to chair transfers with assistance (PT). Flowsheets (Taken 12/09/2020 1439) LTG: Pt will perform Bed to Chair Transfers with assistance level: Supervision/Verbal cueing   Problem: RH Car Transfers Goal: LTG Patient will perform car transfers with assist (PT) Description: LTG: Patient will perform car transfers with assistance (PT). Flowsheets (Taken 12/09/2020 1439) LTG: Pt will perform car transfers with assist:: Supervision/Verbal cueing   Problem: RH Ambulation Goal: LTG  Patient will ambulate in controlled environment (PT) Description: LTG: Patient will ambulate in a controlled environment, # of feet with assistance (PT). Flowsheets (Taken 12/09/2020 1439) LTG: Pt will ambulate in controlled environ  assist needed:: Supervision/Verbal cueing LTG: Ambulation distance in controlled environment: 150 Goal: LTG Patient will ambulate in home environment (PT) Description: LTG: Patient will ambulate in home environment, # of feet with assistance (PT). Flowsheets (Taken 12/09/2020 1439) LTG: Pt will ambulate in home environ  assist needed:: Supervision/Verbal cueing LTG: Ambulation distance in home environment: 50   Problem: RH Stairs Goal: LTG Patient will ambulate up and down stairs w/assist (PT) Description: LTG: Patient will ambulate up and down # of stairs with assistance (PT) Flowsheets (Taken 12/09/2020 1439) LTG: Pt will ambulate up/down stairs assist needed:: Supervision/Verbal cueing LTG: Pt will  ambulate up and down number of stairs: 4

## 2020-12-09 NOTE — Progress Notes (Signed)
Patient family requesting patient to not receive any dietary supplements contains saccharides or corn syrup sugars or sucrose

## 2020-12-09 NOTE — Progress Notes (Signed)
[facility] Individual Statement of Services  Patient Name:  Jacqueline Bonilla  Date:  12/09/2020  Welcome to the [facility].  Our goal is to provide you with an individualized program based on your diagnosis and situation, designed to meet your specific needs.  With this comprehensive rehabilitation program, you will be expected to participate in at least 3 hours of rehabilitation therapies Monday-Friday, with modified therapy programming on the weekends.  Your rehabilitation program will include the following services:  Physical Therapy (PT), Occupational Therapy (OT), Speech Therapy (ST), 24 hour per day rehabilitation nursing, Therapeutic Recreaction (TR), Neuropsychology, Care Coordinator, Rehabilitation Medicine, Nutrition Services, Pharmacy Services, and Other  Weekly team conferences will be held on Wednesday to discuss your progress.  Your Inpatient Rehabilitation Care Coordinator will talk with you frequently to get your input and to update you on team discussions.  Team conferences with you and your family in attendance may also be held.  Expected length of stay: 14-16 Days  Overall anticipated outcome:  MOD I/SUP  Depending on your progress and recovery, your program may change. Your Inpatient Rehabilitation Care Coordinator will coordinate services and will keep you informed of any changes. Your Inpatient Rehabilitation Care Coordinator's name and contact numbers are listed  below.  The following services may also be recommended but are not provided by the [facility]:   [place] will be made to provide these services after discharge if needed.  Arrangements include referral to agencies that provide these services.  Your insurance has been verified to be:  [employer] Your primary doctor is:  Carlton Adam  Pertinent information  will be shared with your doctor and your insurance company.  Inpatient Rehabilitation Care Coordinator:  Erlene Quan, [place] or [phone]  Information discussed with and copy given to patient by: [name], 12/09/2020, 12:13 PM

## 2020-12-09 NOTE — Discharge Instructions (Addendum)
Inpatient Rehab Discharge Instructions  DEMISHA NOKES Discharge date and time: No discharge date for patient encounter.   Activities/Precautions/ Functional Status: Activity: activity as tolerated Diet: regular diet Wound Care: Routine skin checks Functional status:  ___ No restrictions     ___ Walk up steps independently ___ 24/7 supervision/assistance   ___ Walk up steps with assistance ___ Intermittent supervision/assistance  ___ Bathe/dress independently ___ Walk with walker     __x_ Bathe/dress with assistance ___ Walk Independently    ___ Shower independently ___ Walk with assistance    ___ Shower with assistance ___ No alcohol     ___ Return to work/school ________  COMMUNITY REFERRALS UPON DISCHARGE:    Outpatient: PT     OT    ST                Agency: [facility] Phone: [phone]              Appointment Date/Time: TBD   Special Instructions: No driving smoking or alcoholSTROKE/TIA DISCHARGE INSTRUCTIONS  Follow-up with neurology services [phone] in regards to timeline on using aspirin and Brilinta    SMOKING Cigarette smoking nearly doubles your risk of having a stroke & is the single most alterable risk factor  If you smoke or have smoked in the last 12 months, you are advised to quit smoking for your health. Most of the excess cardiovascular risk related to smoking disappears within a year of stopping. Ask you doctor about anti-smoking medications [facility] Quit Line: 1-800-QUIT NOW Free Smoking Cessation Classes [phone]  CHOLESTEROL Know your levels; limit fat & cholesterol in your diet  Lipid Panel     Component Value Date/Time   CHOL 153 12/02/2020 0440   CHOL 193 09/08/2020 0813   TRIG 81 12/02/2020 0440   HDL 48 12/02/2020 0440   HDL 52 09/08/2020 0813   CHOLHDL 3.2 12/02/2020 0440   VLDL 16 12/02/2020 0440   LDLCALC 89 12/02/2020 0440   LDLCALC 127 (H) 09/08/2020 0813     Many patients benefit from treatment even if their cholesterol is at  goal. Goal: Total Cholesterol (CHOL) less than 160 Goal:  Triglycerides (TRIG) less than 150 Goal:  HDL greater than 40 Goal:  LDL (LDLCALC) less than 100   BLOOD PRESSURE American Stroke Association blood pressure target is less that 120/80 mm/Hg  Your discharge blood pressure is:  BP: 106/72 Monitor your blood pressure Limit your salt and alcohol intake Many individuals will require more than one medication for high blood pressure  DIABETES (A1c is a blood sugar average for last 3 months) Goal HGBA1c is under 7% (HBGA1c is blood sugar average for last 3 months)  Diabetes: No known diagnosis of diabetes    Lab Results  Component Value Date   HGBA1C 5.7 (H) 12/02/2020    Your HGBA1c can be lowered with medications, healthy diet, and exercise. Check your blood sugar as directed by your physician Call your physician if you experience unexplained or low blood sugars.  PHYSICAL ACTIVITY/REHABILITATION Goal is 30 minutes at least 4 days per week  Activity: Increase activity slowly, Therapies: Physical Therapy: Home Health Return to work:  Activity decreases your risk of heart attack and stroke and makes your heart stronger.  It helps control your weight and blood pressure; helps you relax and can improve your mood. Participate in a regular exercise program. Talk with your doctor about the best form of exercise for you (dancing, walking, swimming, cycling).  DIET/WEIGHT Goal  is to maintain a healthy weight  Your discharge diet is:  Diet Order             Diet regular Room service appropriate? Yes; Fluid consistency: Thin  Diet effective now                   liquids Your height is:  Height: 5\' 4"  (162.6 cm) Your current weight is: Weight: 49.3 kg Your Body Mass Index (BMI) is:  BMI (Calculated): 18.64 Following the type of diet specifically designed for you will help prevent another stroke. Your goal weight range is:   Your goal Body Mass Index (BMI) is 19-24. Healthy food  habits can help reduce 3 risk factors for stroke:  High cholesterol, hypertension, and excess weight.  RESOURCES Stroke/Support Group:  Call [phone]   STROKE EDUCATION PROVIDED/REVIEWED AND GIVEN TO PATIENT Stroke warning signs and symptoms How to activate emergency medical system (call 911). Medications prescribed at discharge. Need for follow-up after discharge. Personal risk factors for stroke. Pneumonia vaccine given: No Flu vaccine given: No My questions have been answered, the writing is legible, and I understand these instructions.  I will adhere to these goals & educational materials that have been provided to me after my discharge from the hospital.       My questions have been answered and I understand these instructions. I will adhere to these goals and the provided educational materials after my discharge from the hospital.  Patient/Caregiver Signature _______________________________ Date __________  Clinician Signature _______________________________________ Date __________  Please bring this form and your medication list with you to all your follow-up doctor's appointments.

## 2020-12-09 NOTE — IPOC Note (Addendum)
Overall Plan of Care [facility]) Patient Details Name: Jacqueline Bonilla MRN: [id number] DOB: 20-Nov-1955  Admitting Diagnosis: Left middle cerebral artery stroke [facility])  Hospital Problems: Principal Problem:   Left middle cerebral artery stroke [facility])     Functional Problem List: Nursing Medication Management, Safety, Bladder, Endurance  PT Balance, Behavior, Endurance, Motor, Nutrition, Pain, Safety, Perception, Sensory, Skin Integrity  OT Balance, Cognition, Motor, Sensory, Perception, Safety, Vision, Pain, Endurance  SLP Cognition, Linguistic, Safety  TR         Basic ADL's: OT Eating, Grooming, Bathing, Dressing     Advanced  ADL's: OT       Transfers: PT Bed Mobility, Bed to Chair, Car, Manufacturing systems engineer, Metallurgist: PT Ambulation, Stairs     Additional Impairments: OT Fuctional Use of Upper Extremity  SLP Communication, Social Cognition expression, comprehension Problem Solving, Memory, Awareness, Attention  TR      Anticipated Outcomes Item Anticipated Outcome  Self Feeding modified independent  Swallowing      Basic self-care  supervision  Toileting  supervision   Bathroom Transfers supervision  Bowel/Bladder  manage bladder w mod I assist  Transfers  spv  Locomotion  spv  Communication  sup A  Cognition  sup A  Pain  n/a  Safety/Judgment  maintain w cues reminders   Therapy Plan: PT Intensity: Minimum of 1-2 x/day ,45 to 90 minutes PT Frequency: 5 out of 7 days PT Duration Estimated Length of Stay: 7-10 days OT Intensity: Minimum of 1-2 x/day, 45 to 90 minutes OT Frequency: 5 out of 7 days OT Duration/Estimated Length of Stay: 10 -12 days SLP Intensity: Minumum of 1-2 x/day, 30 to 90 minutes SLP Frequency: 3 to 5 out of 7 days SLP Duration/Estimated Length of Stay: ~1.5 weeks   Due to the current state of emergency, patients may not be receiving their 3-hours of Medicare-mandated therapy.   Team  Interventions: Nursing Interventions Bladder Management, Disease Management/Prevention, Medication Management, Discharge Planning, Patient/Family Education  PT interventions Ambulation/gait training, Discharge planning, Functional mobility training, Psychosocial support, Therapeutic Activities, Visual/perceptual remediation/compensation, Balance/vestibular training, Disease management/prevention, Neuromuscular re-education, Skin care/wound management, Therapeutic Exercise, Wheelchair propulsion/positioning, Cognitive remediation/compensation, DME/adaptive equipment instruction, Pain management, Splinting/orthotics, UE/LE Strength taining/ROM, Community reintegration, Technical sales engineer stimulation, Patient/family education, IT trainer, UE/LE Coordination activities  OT Interventions Training and development officer, Discharge planning, Pain management, Self Care/advanced ADL retraining, Therapeutic Activities, Patient/family education, DME/adaptive equipment instruction, Neuromuscular re-education, UE/LE Strength taining/ROM, Therapeutic Exercise, Cognitive remediation/compensation, Community reintegration, Disease mangement/prevention, Functional mobility training, UE/LE Coordination activities, Visual/perceptual remediation/compensation, Psychosocial support  SLP Interventions Cognitive remediation/compensation, Environmental controls, Internal/external aids, Speech/Language facilitation, Cueing hierarchy, Functional tasks, Patient/family education, Therapeutic Activities  TR Interventions    SW/CM Interventions Discharge Planning, Psychosocial Support, Patient/Family Education, Disease Management/Prevention   Barriers to Discharge MD   Safety awareness  Nursing Decreased caregiver support, Home environment access/layout 1 level 3st left rail w spouse; Has DME  PT Other (comments) h/o multiple strokes  OT      SLP      SW Lack of/limited family support, Decreased caregiver support, Insurance underwriter  for SNF coverage     Team Discharge Planning: Destination: PT-Home ,OT- Home , SLP-Home Projected Follow-up: PT-Home health PT, Outpatient PT, 24 hour supervision/assistance, OT-  Outpatient OT, SLP-Outpatient SLP, 24 hour supervision/assistance Projected Equipment Needs: PT-To be determined, OT- None recommended by OT, SLP-None recommended by SLP Equipment Details: PT- , OT-  Patient/family involved in discharge planning: PT- Patient, Family member/caregiver,  OT-Patient,  SLP-Patient, Family member/caregiver  MD ELOS: 10-14d Medical Rehab Prognosis:  Excellent Assessment:  65 year old right-handed female with history of anxiety/depression, chronic anemia, 2 prior strokes in the past 14 months and did receive inpatient rehab services 06/28/2020 - 07/28/2020 1 presented with right side weakness followed by improvement to mild residual deficits the second with left side weakness and maintained on aspirin, loop recorder implant October 2021, hypertension, osteoporosis, thrombocytopenia, vascular dementia.  Per chart review patient lives with spouse.  1 level home 3 steps to entry.  Modified independent and currently not driving.  Presented 12/01/2020 with acute onset of aphasia and right side incoordination relative to her baseline deficit.  Initial cranial CT scan negative for acute changes.  CT angiogram head and neck occlusion of distal left M2 MCA branch at the posterior aspect of the sylvian fissure.  No hemodynamically significant stenosis in the neck.  Patient underwent successful mechanical thrombectomy  per interventional radiology and a follow-up cranial CT scan showed subarachnoid hyperdensity over both hemispheres, left greater than right likely combination contrast extravasation and subarachnoid blood.  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   MRI/MRA completed 12/02/2020 scattered small acute infarcts in the left MCA distribution with also noted subarachnoid hemorrhage overlying the bilateral cerebral hemispheres not significantly changed from prior cranial CT scan.  Neurology follow-up currently maintained on low-dose aspirin therapy and Brilinta was later initiated.  Echocardiogram with ejection fraction of 55 to 60% no wall motion abnormalities.  TEE showed ejection fraction 60% without thrombus or PFO.  No source of embolus..  Tolerating a regular diet.  Bouts of urinary retention placed on low-dose Urecholine as well as Flomax.  Therapy evaluations completed due to patient's aphasia and right-sided incoordination was admitted for a comprehensive rehab program.    See Team Conference Notes for weekly updates to the plan of care

## 2020-12-09 NOTE — Progress Notes (Signed)
Occupational Therapy Session Note  Patient Details  Name: Jacqueline Bonilla MRN: [id number] Date of Birth: 07-Aug-1955  Today's Date: 12/09/2020 OT Individual Time: 1450-1530 OT Individual Time Calculation (min): 40 min    Short Term Goals: Week 1:     Skilled Therapeutic Interventions/Progress Updates:    Pt seen this session to further assess sensation and vision and work on adaptive strategies.  She has limited stereognosis and  Kinesthesia, but fair proprioception and temperature sensation.  Had pt practice trying to identify items in her hand and then open eyes to see if it matched what she thought. She actually had difficulty with her L hand too.  Visually, her tracking is not smooth and she has difficulty keeping her eyes on the target. With reading she can see very large font but 10 or 12pt is difficult.  She has new bifocals she just got before this stroke so she may need time to adjust.  R peripheral field is limited to 45 degrees. Pt did bump her R hip into sink as she ambulated to toilet.  Toileting was close S, transfer CGA.    Had pt use lighthouse technique to scan room but needed max cues to do so. She will need a great deal of reinforcement with this task.   Pt opted to get in bed,  but then she could not figure out how to scoot up in bed. Cued her to push through arms and legs and then she was able to do so. Pt resting in bed with all needs met. Alarm set.     Therapy Documentation Precautions:  Precautions Precautions: Fall, Other (comment) Precaution Comments: apraxic, ataxic Restrictions Weight Bearing Restrictions: No    Vital Signs: Therapy Vitals Temp: 98 F (36.7 C) Temp Source: Oral Pulse Rate: 91 Resp: 16 BP: 106/61 Patient Position (if appropriate): Sitting Oxygen Therapy SpO2: 97 % O2 Device: Room Air Pain: Pain Assessment Pain Scale: 0-10 Pain Score: 1  Pain Type: Chronic pain Pain Location: Back      Therapy/Group: Individual  Therapy  [place] 12/09/2020, 4:20 PM

## 2020-12-09 NOTE — Evaluation (Signed)
Occupational Therapy Assessment and Plan  Patient Details  Name: Jacqueline Bonilla MRN: [id number] Date of Birth: 1955-10-03  OT Diagnosis: abnormal posture, acute pain, altered mental status, apraxia, cognitive deficits, disturbance of vision, hemiplegia affecting dominant side, and muscle weakness (generalized) Rehab Potential: Rehab Potential (ACUTE ONLY): Excellent ELOS: 10 -12 days   Today's Date: 12/09/2020 OT Individual Time: [id number] OT Individual Time Calculation (min): 63 min     Hospital Problem: Principal Problem:   Left middle cerebral artery stroke [facility])   Past Medical History:  Past Medical History:  Diagnosis Date   Anemia    Anxiety    Depression    Dysmenorrhea    Endometriosis    Fibroid    Hypertension    Microhematuria    negative workup   Osteoporosis    Tachycardia    Thrombocytopenia (HCC)    Past Surgical History:  Past Surgical History:  Procedure Laterality Date   BREAST BIOPSY     CESAREAN SECTION     hysteroscopic resection     implantable loop recorder implant  10/21/2019   Med[employer]onic Reveal Linq model LNQ 22 ([place] [id number]) implantable loop recorder   IR CT HEAD LTD  12/01/2020   IR PERCUTANEOUS ART THROMBECTOMY/INFUSION INTRACRANIAL INC DIAG ANGIO  12/01/2020   IR US GUIDE VASC ACCESS RIGHT  12/01/2020   RADIOLOGY WITH ANESTHESIA N/A 12/01/2020   Procedure: IR WITH ANESTHESIA - CODE STROKE;  Surgeon: Radiologist, Medication, MD;  Location: [place];  Service: Radiology;  Laterality: N/A;    Assessment & Plan Clinical Impression: Patient is a 65 y.o. year old female with recent admission to the hospital on 12/01/2020 with acute onset of aphasia and right side incoordination relative to her baseline deficit.  Initial cranial CT scan negative for acute changes.  CT angiogram head and neck occlusion of distal left M2 MCA branch at the posterior aspect of the sylvian fissure.  No hemodynamically significant stenosis in the neck.  Patient underwent  successful mechanical thrombectomy  per interventional radiology and a follow-up cranial CT scan showed subarachnoid hyperdensity over both hemispheres, left greater than right likely combination contrast extravasation and subarachnoid blood.  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  MRI/MRA completed 12/02/2020 scattered small acute infarcts in the left MCA distribution with also noted subarachnoid hemorrhage overlying the bilateral cerebral hemispheres not significantly changed from prior cranial CT scan.  Patient transferred to CIR on 12/08/2020 .    Patient currently requires mod with basic self-care skills secondary to muscle weakness and muscle paralysis, impaired timing and sequencing, unbalanced muscle activation, motor apraxia, decreased coordination, and decreased motor planning, field cut, decreased attention to right and decreased motor planning, decreased attention, decreased awareness, decreased problem solving, decreased safety awareness, and decreased memory, and decreased standing balance, decreased postural control, hemiplegia, and decreased balance strategies.  Prior to hospitalization, patient could complete ADLs with modified independent .  Patient will benefit from skilled intervention to decrease level of assist with basic self-care skills and increase independence with basic self-care skills prior to discharge home with care partner.  Anticipate patient will require 24 hour supervision and follow up outpatient.  OT - End of Session Activity Tolerance: Tolerates 30+ min activity with multiple rests Endurance Deficit: Yes OT Assessment Rehab Potential (ACUTE ONLY): Excellent OT Patient demonstrates impairments in the following area(s): Balance;Cognition;Motor;Sensory;Perception;Safety;Vision;Pain;Endurance OT Basic ADL's Functional  Problem(s):  Eating;Grooming;Bathing;Dressing OT Transfers Functional Problem(s): Toilet;Tub/Shower OT Additional Impairment(s): Fuctional Use of Upper Extremity OT Plan OT Intensity: Minimum of 1-2 x/day, 45 to 90 minutes OT Frequency: 5 out of 7 days OT Duration/Estimated Length of Stay: 10 -12 days OT Treatment/Interventions: Balance/vestibular training;Discharge planning;Pain management;Self Care/advanced ADL retraining;Therapeutic Activities;Patient/family education;DME/adaptive equipment instruction;Neuromuscular re-education;UE/LE Strength taining/ROM;Therapeutic Exercise;Cognitive remediation/compensation;Community reintegration;Disease mangement/prevention;Functional mobility training;UE/LE Coordination activities;Visual/perceptual remediation/compensation;Psychosocial support OT Self Feeding Anticipated Outcome(s): modified independent OT Basic Self-Care Anticipated Outcome(s): supervision OT Toileting Anticipated Outcome(s): supervision OT Bathroom Transfers Anticipated Outcome(s): supervision OT Recommendation Patient destination: Home Follow Up Recommendations: Outpatient OT Equipment Recommended: None recommended by OT   OT Evaluation Precautions/Restrictions  Precautions Precautions: Fall;Other (comment) Restrictions Weight Bearing Restrictions: No General   Vital Signs Therapy Vitals Temp: 98 F (36.7 C) Temp Source: Oral Pulse Rate: 91 Resp: 16 BP: 106/61 Patient Position (if appropriate): Sitting Oxygen Therapy SpO2: 97 % O2 Device: Room Air Pain   Home Living/Prior Functioning Home Living Living Arrangements: Spouse/significant other Available Help at Discharge: Family, Available 24 hours/day Type of Home: House Home Access: Stairs to enter Technical brewer of Steps: 3 Entrance Stairs-Rails: Left Home Layout: One level Bathroom Shower/Tub: Multimedia programmer: Handicapped height Bathroom Accessibility: Yes  Lives With:  Spouse IADL History Homemaking Responsibilities: No Current License: No Occupation: Retired Prior Function Level of Independence: Independent with basic ADLs, Independent with gait, Independent with transfers, Independent with homemaking with ambulation  Able to Take Stairs?: Yes Driving: No Vision Baseline Vision/History: 1 Wears glasses (wears all the time) Ability to See in Adequate Light: 0 Adequate Patient Visual Report: No change from baseline Vision Assessment?: Vision impaired- to be further tested in functional context Eye Alignment: Within Functional Limits Alignment/Gaze Preference: Within Defined Limits Convergence: Within functional limits Visual Fields: Right visual field deficit Perception  Perception: Impaired Comments: slight right inattention Praxis Praxis: Impaired Praxis Impairment Details: Motor planning;Ideomotor Cognition Overall Cognitive Status: History of cognitive impairments - at baseline Arousal/Alertness: Awake/alert Orientation Level: Person;Place;Situation Person: Oriented Place: Oriented Situation: Oriented Year: 2022 Month: December Day of Week: Correct Memory: Impaired Memory Impairment: Storage deficit;Retrieval deficit Immediate Memory Recall: Sock;Blue;Bed Memory Recall Sock: Without Cue Memory Recall Blue: Without Cue Memory Recall Bed: Without Cue Attention: Selective;Sustained;Focused Focused Attention: Appears intact Sustained Attention: Appears intact Selective Attention: Impaired Selective Attention Impairment: Functional basic Awareness: Impaired Awareness Impairment: Intellectual impairment Problem Solving: Impaired Problem Solving Impairment: Functional basic Executive Function: Reasoning;Self Monitoring;Self Correcting Reasoning: Impaired Reasoning Impairment: Verbal complex Self Monitoring: Impaired Self Monitoring Impairment: Verbal complex Self Correcting: Impaired Self Correcting Impairment: Verbal  complex Safety/Judgment: Impaired Sensation Sensation Hot/Cold: Not tested Proprioception: Not tested Stereognosis: Not tested Coordination Gross Motor Movements are Fluid and Coordinated: No Fine Motor Movements are Fluid and Coordinated: No Coordination and Movement Description: Pt with decreased FM coordination and gross motor coordination in the RUE.  She uses the UE at a diminshed level for selfcare tasks. Motor  Motor Motor: Hemiplegia Motor - Skilled Clinical Observations: Apraxia, slight right hemi  Trunk/Postural Assessment  Cervical Assessment Cervical Assessment: Exceptions to [facility] (forward head) Thoracic Assessment Thoracic Assessment: Exceptions to [facility] (slight thoracic rounding) Lumbar Assessment Lumbar Assessment: Exceptions to [facility] (posterior pelvic tilt)  Balance Balance Balance Assessed: Yes Static Sitting Balance Static Sitting - Balance Support: Feet supported Static Sitting - Level of Assistance: 5: Stand by assistance Dynamic Sitting Balance Dynamic Sitting - Balance Support: During functional activity Dynamic Sitting - Level of Assistance: 5: Stand by assistance Static Standing Balance Static Standing - Balance  Support: No upper extremity supported Static Standing - Level of Assistance: 4: Min assist Dynamic Standing Balance Dynamic Standing - Balance Support: During functional activity Dynamic Standing - Level of Assistance: 4: Min assist Extremity/Trunk Assessment RUE Assessment RUE Assessment: Exceptions to [facility] Passive Range of Motion (PROM) Comments: WFLs Active Range of Motion (AROM) Comments: WFLS General Strength Comments: Pt with decreased FM and gross motor coordination but uses at a diminshed level for selfcare tasks. LUE Assessment LUE Assessment: Within Functional Limits  Care Tool Care Tool Self Care Eating   Eating Assist Level: Minimal Assistance - Patient > 75%    Oral Care    Oral Care Assist Level: Minimal Assistance - Patient  > 75%    Bathing   Body parts bathed by patient: Right arm;Left arm;Chest;Abdomen;Front perineal area;Buttocks;Right upper leg;Left upper leg;Left lower leg;Right lower leg;Face     Assist Level: Minimal Assistance - Patient > 75%    Upper Body Dressing(including orthotics)   What is the patient wearing?: Pull over shirt   Assist Level: Moderate Assistance - Patient 50 - 74%    Lower Body Dressing (excluding footwear)   What is the patient wearing?: Pants;Underwear/pull up Assist for lower body dressing: Moderate Assistance - Patient 50 - 74%    Putting on/Taking off footwear   What is the patient wearing?: Shoes;Ted hose Assist for footwear: Moderate Assistance - Patient 50 - 74%       Care Tool Toileting Toileting activity   Assist for toileting: Minimal Assistance - Patient > 75%     Care Tool Bed Mobility Roll left and right activity   Roll left and right assist level: Supervision/Verbal cueing    Sit to lying activity   Sit to lying assist level: Contact Guard/Touching assist    Lying to sitting on side of bed activity   Lying to sitting on side of bed assist level: the ability to move from lying on the back to sitting on the side of the bed with no back support.: Contact Guard/Touching assist     Care Tool Transfers Sit to stand transfer   Sit to stand assist level: Minimal Assistance - Patient > 75%    Chair/bed transfer   Chair/bed transfer assist level: Minimal Assistance - Patient > 75%     Toilet transfer   Assist Level: Minimal Assistance - Patient > 75%     Care Tool Cognition  Expression of Ideas and Wants Expression of Ideas and Wants: 3. Some difficulty - exhibits some difficulty with expressing needs and ideas (e.g, some words or finishing thoughts) or speech is not clear  Understanding Verbal and Non-Verbal Content Understanding Verbal and Non-Verbal Content: 3. Usually understands - understands most conversations, but misses some part/intent of  message. Requires cues at times to understand   Memory/Recall Ability Memory/Recall Ability : Current season;That he or she is in a hospital/hospital unit   Refer to Care Plan for [place] 1 OT Short Term Goal 1 (Week 1): Pt will complete UB dressing with supervision. OT Short Term Goal 2 (Week 1): Pt will complete LB dressing with min assist. OT Short Term Goal 3 (Week 1): Pt will complete toileting tasks with supervison and no assistive device. OT Short Term Goal 4 (Week 1): Pt will complete two grooming tasks at supervision level in standing.  Recommendations for other services: None    Skilled Therapeutic Intervention ADL ADL Eating: Minimal assistance Where Assessed-Eating: Bed level Grooming: Minimal assistance  Where Assessed-Grooming: Standing at sink Upper Body Bathing: Supervision/safety Where Assessed-Upper Body Bathing: Shower Lower Body Bathing: Minimal assistance Where Assessed-Lower Body Bathing: Shower;Chair Upper Body Dressing: Moderate assistance Where Assessed-Upper Body Dressing: Edge of bed Lower Body Dressing: Moderate assistance Where Assessed-Lower Body Dressing: Edge of bed Toileting: Moderate assistance Where Assessed-Toileting: Bedside Commode Toilet Transfer: Minimal assistance Toilet Transfer Method: Counselling psychologist: Energy manager: Not assessed Social research officer, government: Minimal assistance Social research officer, government Method: Heritage manager: Transfer tub bench Mobility  Bed Mobility Bed Mobility: Supine to Sit;Sit to Supine Supine to Sit: Supervision/Verbal cueing Sit to Supine: Supervision/Verbal cueing Transfers Sit to Stand: Minimal Assistance - Patient > 75% Stand to Sit: Minimal Assistance - Patient > 75%  Session Note:  Pt in bed to start, pleasant and cooperative.  She was unable to recall this therapist from last CVA.  She was oriented to place, time, and  situation, but could not state any deficits from CVA.  Her spouse came in as well during start of session with pt completing transfer to the EOB at supervision level.  She then ambulated to gather clothes for dressing after completion of shower.  She needed mod questioning cueing to gather all items as she had multiple shirts and underclothes without awareness of this.  She also dropped several items from her left hand without awareness as well.  She completed all functional transfers and mobility with min assist without an assistive device.  She was able to complete shower with min assist.  Noted motor planning deficits with attempted use of the shower head as well as when trying to sequence application of soap on the washcloth.  She transferred to the EOB for dressing with mod apraxia noted with application of deodorant and donning all clothing.  Mod assist for all dressing tasks except donning slip on shoes, which were completed at supervision level.  Finished session with completion of combing her hair in standing with min assist.  She was left sitting in the recliner with the call button and phone in reach working on eating breakfast with spouse present.  Discussed expectations for goals at supervision level and need for 24 hr supervision at discharge.    Discharge Criteria: Patient will be discharged from OT if patient refuses treatment 3 consecutive times without medical reason, if treatment goals not met, if there is a change in medical status, if patient makes no progress towards goals or if patient is discharged from hospital.  The above assessment, treatment plan, treatment alternatives and goals were discussed and mutually agreed upon: by patient and by family  Fedra Lanter OTR/L 12/09/2020, 5:55 PM

## 2020-12-09 NOTE — Progress Notes (Signed)
Inpatient Rehabilitation  Patient information reviewed and entered into eRehab system by Clytie Shetley M. Bionca Mckey, M.A., CCC/SLP, PPS Coordinator.  Information including medical coding, functional ability and quality indicators will be reviewed and updated through discharge.    

## 2020-12-10 DIAGNOSIS — I63512 Cerebral infarction due to unspecified occlusion or stenosis of left middle cerebral artery: Secondary | ICD-10-CM | POA: Diagnosis not present

## 2020-12-10 MED ORDER — LIDOCAINE 5 % EX PTCH
2.0000 | MEDICATED_PATCH | CUTANEOUS | Status: DC
  Administered 2020-12-10 – 2020-12-15 (×5): 2 via TRANSDERMAL
  Filled 2020-12-10 (×7): qty 2

## 2020-12-10 NOTE — Progress Notes (Signed)
Physical Therapy Session Note  Patient Details  Name: Jacqueline Bonilla MRN: [id number] Date of Birth: 12-03-1955  Today's Date: 12/10/2020 PT Individual Time: [phone] and [phone] and [phone] PT Individual Time Calculation (min): 75 min and 28 min and 46 min  Short Term Goals: Week 1:  PT Short Term Goal 1 (Week 1): STG= LTG based on ELOS  Skilled Therapeutic Interventions/Progress Updates:    Session 1 ([phone]): Pt received ambulating out of the bathroom with nursing. Pt agreeable to therapy session. Requires verbal cuing to recall need to perform hand hygiene - CGA for standing balance. Sit>stand to EOB with CGA - pt vocalizing out in pain when going to sit, stating her hips and buttocks hurt - nurse notified for medication administration. Sitting on EOB doffed gripper socks and donned TEDs (max assist) with tennis shoes set-up assist. Standing at sink performed oral hygiene with pt having significant apraxia with difficulty managing moving items from L hand to R hand and when going to place toothpaste onto the brush required hand-over-hand assist as pt initially missing the toothbrush (visuospatial and visuomotor impairments)  then requires step-by-step verbal cuing for sequencing moving toothbrush from R hand to L in order to rinse mouth holding cup with R hand.  Gait training ~167ft to main therapy gym, no AD, with CGA for steadying - noticed to have some possible R inattention vs impaired R/L awareness with pt turning L when asked to turn R - reciprocal gait pattern with no specific gait deviations noted other then slightly flexed posture with slightly posteriorly rotated pelvis.  Attempted the following dynamic gait training tasks using agility ladder: - forward reciprocal stepping - min assist for balance - pt unable to achieve reciprocal pattern and steps on ladder every time then with further repetitions pt noticed to be deviating completely outside of the ladder towards the L  with poor awareness of this and not able to correct - transitioned to trying side stepping - min assist for balance and again pt unable to take big enough side steps causing her to step on ladder and then starts to deviate forward completely off of the ladder when stepping towards R - pt not able to correct this despite cuing  *determined the agility ladder is too challenging of a task at this time from a visuospatial and motor planning perspective  Gait training, no AD, targeting R attention and visual scanning to locate numbered disks in hallway - #1-7 but not  #4 - CGA for steadying - requires ~62minutes each time to complete this task - requires cuing to look up as pt frequently looking down towards the floor as opposed to on the wall - cues for R attention.  Dynamic standing balance via 3 cone tap on verbal command - min assist - increased time to motor plan and process - pt frequently starts to tap wrong foot with difficulty delineating R vs L.   Gait back to room as above. Pt left seated in recliner with needs in reach, her husband present, and seat belt alarm on.    Session 2 [phone]): Pt received sitting in recliner with her husband present and pt agreeable to therapy session. Sit<>stands, no AD, with CGA for safety - pt slow to rise and lower, still appearing to have discomfort with this, but when asked pt reports her pain is better. Gait in room, no AD, with CGA for steadying while collecting her mask - pt continues to have impaired R UE coordination with likely possible  R inattention along with apraxia having difficulty orientating her mask correct, continuing to require assist. Gait ~129ft to/from ADL apartment, no AD, with CGA for safety/steadying - continues to have reciprocal stepping pattern with good foot clearance - pt with improved implementation of directions turning R towards gym.  In ADL apartment targeting dynamic gait, dynamic standing balance, R attention, motor planning, and  R UE NMR had pt participate in task of making the table for 3 people including plates, spoons, forks, and napkins - pt unable to determine the need for cups - requires repeated cuing to incorporate R UE into the task even though pt is R handed - pt with problem solving impairments requiring question cuing to determine that the silverware would be located in a drawer as she initially is looking in cabinets - when placing plates and silverware on the table pt unable to orient the items (attempted to have pt place spoon L of plate, fork on R side of plate, and napkin at the top of the plate) - requires max cuing. Then had pt carry items from table to put in dishwasher as this is something she does at home - pt initially not able to locate dishwasher handle with her R hand requiring 3x attempts - when placing plates in the dishwasher pt not able to orient them and place in an organized line without max cuing. Gait back to room as described above. Pt left seated in chair with her sister present for safety - nursing staff aware - family aware that pt is to call for assistance when she needs to get up.   Session 3: Pt received supine in bed and agreeable to therapy session. Pt continues to report pain in gluteal region and appears to have increased pain compared to 2nd session - nurse notified for medication administration. Supine>sitting R EOB, HOB partially elevated, with supervision and increased time/effort. Sitting EOB doffed socks and donned shoes set-up assist. Sit<>stands, no AD, with CGA for safety throughout session - continues to demo pain with this resulting in poor eccentric control when going to sit. Gait training ~174ft to main therapy gym, no AD, with CGA for steadying - continues to have improved carryover of directions to gym now that they are familiar. Participated in dynamic standing balance with dual-task for R UE NMR and motor planning via folding towels and wash clothes -  close supervision/CGA  for balance - requires step-by-step visual demonstration with max verbal cuing to orient towels correctly and fold them progressed to mod cuing/demonstration - able to fold wash clothes with less assist. Loaded the towels and washclothes in laundry basket with verbal cuing - demos impaired R UE coordination and motor planning causing towels to fall towards the side. Gait training ~155ft 2x to/from ADL apartment carrying laundry basket with towels - CGA progressed to light min assist for balance due to progressively worsening anterior lean at end due to fatigue. Carried towels/washclothes to put in dirty laundry bag working on bimanual task - pt with significant difficulty seeing the R side of the bag resulting in towels frequently falling out onto the floor on that side - verbal and visual cuing for correction.   Dynamic standing balance and R UE NMR via large ball toss in sitting progressed to in standing against rebounder - starts with significant difficulty catching the ball with gradual improvement - requires CGA/light min assist for balance in standing. Gait back to room as described above with improved navigation back to room with decreased  cuing. Pt left seated in recliner with needs in reach and seat belt alarm on.  Therapy Documentation Precautions:  Precautions Precautions: Fall, Other (comment) Precaution Comments: apraxic, ataxic Restrictions Weight Bearing Restrictions: No   Pain:  Session 1: Reports pain in hips or buttocks - pt states it is hard to figure out exactly where the pain is, but it is most prominent when going from stand<>sit. Nurse present during session for medication administration.   Session 2: Reports her hips/buttocks are feeling better since medication administration.  Session 3: Reports she has now identified the pain to be in the gluteal region of bilateral buttocks - reports to be in increased pain and appears to have more pain during sit<>stand transfers - nurse  notified for medication administration at end of session.     Therapy/Group: Individual Therapy  Tawana Scale , PT, DPT, NCS, CSRS  12/10/2020, 7:50 AM

## 2020-12-10 NOTE — Progress Notes (Signed)
Patient stated she took a sleep medicine from home and does not know if it will interfere with other meds she takes here. Pt reminded not to take any medicine from home without a doctor's order and she agreed. RN found a bottle of melatonin 5 mg in the drawer and confirmed that was the medicine she took. RN instructed pt to send melatonin bottle home and we will get an order from doctor for her to take. Charge RN Roselyn Reef) informed.

## 2020-12-10 NOTE — Progress Notes (Signed)
Occupational Therapy Session Note  Patient Details  Name: Jacqueline Bonilla MRN: [id number] Date of Birth: 07/06/1955  Today's Date: 12/10/2020 OT Individual Time: 1306-1400 OT Individual Time Calculation (min): 54 min    Short Term Goals: Week 1:  OT Short Term Goal 1 (Week 1): Pt will complete UB dressing with supervision. OT Short Term Goal 2 (Week 1): Pt will complete LB dressing with min assist. OT Short Term Goal 3 (Week 1): Pt will complete toileting tasks with supervison and no assistive device. OT Short Term Goal 4 (Week 1): Pt will complete two grooming tasks at supervision level in standing.  Skilled Therapeutic Interventions/Progress Updates:    Pt received in bed ready for therapy.  She donned her shoes independently. Ambulated to ADL kitchen (over 300 ft) with CGA with no AD. She tends to hold R arm in flexed position and does not utilize alt arm swings. In kitchen she opened cabinets and found items I called out and was able to find items in R visual field without cues.  Upon leaving kitchen, she bumped R shoulder into doorway.  Had pt take several steps backwards and scan to her R first and then she was able to proceed through door.   Pt ambulated to ortho gym to work on dynamic standing balance and R side coordination: - sit to stands holding gym ball -modified cross chops with ball -modified "kettlebell swings" with ball -holding hula hoop around waist and twisting for dynamic alternating arm swings in standing then added in ambulation then removed hoop for pt to practice carryover of alt arm swings with good carryover.  -holding hoop with B hands to simulate turning steering wheel for enhanced R sh control -AROM of B arm with arm circles and overhead reaching in standing with focus of moving R arm at same speed as left arm  In sitting, worked on R forearm pron/supin with holding 1# dowel for isolated control  Practiced stepping over obstacle on floor but she had great  difficulty clearing her foot so had pt ambulate to gym to practice with // bars. She had B hand support as she practiced stepping forward and back over yardstick, then progressed to R hand only.  Continued to do well, stepping smoothly. With no Ue support she was still able to clear yard stick but had more difficulty stepping smoothly. Pt ambulated to day room , around nsg station and to room without bumping into anything on R.   She toileted with distant S.  Pt resting in bed with all needs met.  Bed alarm set.   Therapy Documentation Precautions:  Precautions Precautions: Fall, Other (comment) Precaution Comments: apraxic, ataxic Restrictions Weight Bearing Restrictions: No   Pain: Pain Assessment Pain Score: 0-No pain ADL: ADL Eating: Minimal assistance Where Assessed-Eating: Bed level Grooming: Minimal assistance Where Assessed-Grooming: Standing at sink Upper Body Bathing: Supervision/safety Where Assessed-Upper Body Bathing: Shower Lower Body Bathing: Minimal assistance Where Assessed-Lower Body Bathing: Shower, Chair Upper Body Dressing: Moderate assistance Where Assessed-Upper Body Dressing: Edge of bed Lower Body Dressing: Moderate assistance Where Assessed-Lower Body Dressing: Edge of bed Toileting: Moderate assistance Where Assessed-Toileting: Bedside Commode Toilet Transfer: Minimal assistance Toilet Transfer Method: Counselling psychologist: Energy manager: Not assessed Social research officer, government: Minimal assistance Social research officer, government Method: Heritage manager: Radio broadcast assistant   Therapy/Group: Individual Therapy  [place] 12/10/2020, 1:33 PM

## 2020-12-10 NOTE — Progress Notes (Signed)
PROGRESS NOTE   Subjective/Complaints:  Pt c/o significant low back pain- doesn't like to take meds- even Tylenol- we discussed using lidoderm patches- she agreed- will try tonight.  Husband asking if can join her in gym- I agreed.   ROS-  Pt denies SOB, abd pain, CP, N/V/C/D, and vision changes  Objective:   No results found. Recent Labs    12/09/20 0508  WBC 5.4  HGB 10.6*  HCT 31.6*  PLT 230   Recent Labs    12/09/20 0508  NA 135  K 4.0  CL 100  CO2 26  GLUCOSE 103*  BUN 20  CREATININE 0.89  CALCIUM 8.8*    Intake/Output Summary (Last 24 hours) at 12/10/2020 1348 Last data filed at 12/09/2020 1902 Gross per 24 hour  Intake 240 ml  Output --  Net 240 ml        Physical Exam: Vital Signs Blood pressure 103/69, pulse 88, temperature 98.7 F (37.1 C), temperature source Oral, resp. rate 18, height 5\' 4"  (1.626 m), weight 49.3 kg, last menstrual period 01/02/2003, SpO2 95 %.    General: awake, alert, appropriate, walking with PT in hallway;  NAD HENT: conjugate gaze; oropharynx moist CV: regular rate; no JVD Pulmonary: CTA B/L; no W/R/R- good air movement GI: soft, NT, ND, (+)BS Psychiatric: appropriate- flat Neurological: somewhat aphasic  Neurologic: Cranial nerves II through XII intact, motor strength is 5/5 in bilateral deltoid, bicep, tricep, grip, hip flexor, knee extensors, ankle dorsiflexor and plantar flexor Sensory exam normal sensation to light touch and proprioception in bilateral upper and lower extremities Cerebellar exam normal finger to nose to finger as well as heel to shin in bilateral upper and lower extremities Musculoskeletal: Full range of motion in all 4 extremities. No joint swelling   Assessment/Plan: 1. Functional deficits which require 3+ hours per day of interdisciplinary therapy in a comprehensive inpatient rehab setting. Physiatrist is providing close team  supervision and 24 hour management of active medical problems listed below. Physiatrist and rehab team continue to assess barriers to discharge/monitor patient progress toward functional and medical goals  Care Tool:  Bathing  Bathing activity did not occur: Safety/medical concerns Body parts bathed by patient: Right arm, Left arm, Chest, Abdomen, Front perineal area, Buttocks, Right upper leg, Left upper leg, Left lower leg, Right lower leg, Face         Bathing assist Assist Level: Minimal Assistance - Patient > 75%     Upper Body Dressing/Undressing Upper body dressing Upper body dressing/undressing activity did not occur (including orthotics): Safety/medical concerns What is the patient wearing?: Pull over shirt    Upper body assist Assist Level: Moderate Assistance - Patient 50 - 74%    Lower Body Dressing/Undressing Lower body dressing    Lower body dressing activity did not occur: Safety/medical concerns What is the patient wearing?: Underwear/pull up, Pants     Lower body assist Assist for lower body dressing: Independent     Toileting Toileting    Toileting assist Assist for toileting: Contact Guard/Touching assist     Transfers Chair/bed transfer  Transfers assist     Chair/bed transfer assist level: Contact Guard/Touching assist  Locomotion Ambulation   Ambulation assist      Assist level: Contact Guard/Touching assist Assistive device: No Device Max distance: 138ft   Walk 10 feet activity   Assist     Assist level: Contact Guard/Touching assist Assistive device: Hand held assist   Walk 50 feet activity   Assist    Assist level: Minimal Assistance - Patient > 75% Assistive device: Hand held assist    Walk 150 feet activity   Assist    Assist level: Minimal Assistance - Patient > 75% Assistive device: Hand held assist    Walk 10 feet on uneven surface  activity   Assist     Assist level: Minimal Assistance -  Patient > 75% Assistive device: Hand held assist   Wheelchair     Assist Is the patient using a wheelchair?: No             Wheelchair 50 feet with 2 turns activity    Assist            Wheelchair 150 feet activity     Assist          Blood pressure 103/69, pulse 88, temperature 98.7 F (37.1 C), temperature source Oral, resp. rate 18, height 5\' 4"  (1.626 m), weight 49.3 kg, last menstrual period 01/02/2003, SpO2 95 %.  Medical Problem List and Plan: 1.  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  Antithrombotics: -DVT/anticoagulation:  Mechanical:  Antiembolism stockings, knee (TED hose) Bilateral lower extremities             -antiplatelet therapy: Aspirin 81 mg daily and Brilinta 90 mg twice daily 3. Pain Management: Tylenol as needed  12/10- pt doesn't like even tylenol- will try Lidoderm patches 2 of them for back/hip pain- 8pm to 8am and monitor 4. Mood: Provide emotional support             -antipsychotic agents: N/A 5. Neuropsych: This patient is capable of making decisions on her own behalf. 6. Skin/Wound Care: Routine skin checks 7. Fluids/Electrolytes/Nutrition: Routine in and outs with follow-up chemistries 8.  Seizure prophylaxis.  EEG negative.  Keppra 500 mg twice daily 9.  Hypertension.  Cozaar 25 mg daily.  Monitor with increased mobility Vitals:   12/09/20 1951 12/10/20 0500  BP: 107/69 103/69  Pulse: 92 88  Resp: 16 18  Temp: 98.5 F (36.9 C) 98.7 F (37.1 C)  SpO2: 98% 95%   Would avoid hypotension , reduced losartan   12/10- BP still  soft- just had decrease this AM, prior to receiving AM meds- so will wait to monitor trend before reduction more.  10.  Hyperlipidemia.  Lipitor 11.  History of thrombocytopenia.  Latest platelet count 208,000. 12.  Urinary retention.  d/ced foley Urecholine 5 mg 3 times daily, Flomax 0.4 mg daily.    12/10- will order bladder scans for 8 times qshift to make sure emptying- is voiding well per documentation, but no bladder scans/PVRs done.     LOS: 2 days  A FACE TO FACE EVALUATION WAS PERFORMED  Kyshon Tolliver 12/10/2020, 1:48 PM

## 2020-12-11 ENCOUNTER — Encounter (HOSPITAL_COMMUNITY): Payer: Self-pay | Admitting: Cardiovascular Disease

## 2020-12-11 MED ORDER — MELATONIN 5 MG PO TABS
5.0000 mg | ORAL_TABLET | Freq: Every evening | ORAL | Status: DC | PRN
  Administered 2020-12-11 – 2020-12-16 (×5): 5 mg via ORAL
  Filled 2020-12-11 (×5): qty 1

## 2020-12-12 ENCOUNTER — Other Ambulatory Visit: Payer: Self-pay | Admitting: Neurology

## 2020-12-12 DIAGNOSIS — F0150 Vascular dementia without behavioral disturbance: Secondary | ICD-10-CM

## 2020-12-12 DIAGNOSIS — R7989 Other specified abnormal findings of blood chemistry: Secondary | ICD-10-CM

## 2020-12-12 DIAGNOSIS — Z8616 Personal history of COVID-19: Secondary | ICD-10-CM

## 2020-12-12 DIAGNOSIS — I63512 Cerebral infarction due to unspecified occlusion or stenosis of left middle cerebral artery: Secondary | ICD-10-CM | POA: Diagnosis not present

## 2020-12-12 DIAGNOSIS — D649 Anemia, unspecified: Secondary | ICD-10-CM

## 2020-12-12 DIAGNOSIS — R0683 Snoring: Secondary | ICD-10-CM | POA: Insufficient documentation

## 2020-12-12 NOTE — Procedures (Signed)
[facility] [place] TEST REPORT ( by Watch PAT)   STUDY DATE:  11-30-2020 DOB:  27-May-1955    ORDERING CLINICIAN: Larey Seat, MD  REFERRING CLINICIAN: Dr Sarina Ill, MD and Dr. Horald Pollen, MD    CLINICAL INFORMATION/HISTORY: Jacqueline Bonilla is a 65 y.o. Caucasian female and was seen in a sleep consultation on 11/17/2020 from [name] upon her request for a sleep consultation.  .  Chief concern according to patient :  " looking for reasons for my strokes, last in June, with 3 new lesions on MRI "   husband Hines. Internal referral from [name] for morning HA and witnessed sleep apnea. Pt no longer snoring or having sleep interruptions. Sleeps about 6 hrs straights. Pt is using kinesiology tape to keep her mouth closed.     [name] has a past medical history of Anemia, Anxiety, CVA- repeated cryptogenic strokes, 2022, Dysmenorrhea, Epistaxis, Endometriosis, Fibroid, Hypertension, Iron deficiency anemia, Microhematuria, Osteoporosis, Tachycardia, and Thrombocytopenia ([place]). Loop recorder was negative for atrial fibrillation, loop recorder was implanted 12 months ago.   Sleep relevant medical history: Nocturia- 2-5 times - but not since she tapes her jaw- insomnia. Uses a taped jaw guard for this HST for half of the night and the other half was recorded without .    Epworth sleepiness score: 3/24.   BMI: 20.3 kg/m   Neck Circumference: 13"   FINDINGS:   Sleep Summary:   Total Recording Time (hours, min): The total recording time for this home sleep test was 10 hours and 4 minutes of which total sleep time was estimated to be 8 hours 51 minutes.      Percent REM (%): 19.1% of sleep time were REM sleep.                                       Respiratory Indices:   Calculated pAHI (per hour): The calculated AHI was only 5.3/h but during REM sleep rose to 11.8/h in non-REM sleep the baseline AHI was only 4/h.  Positional apnea  data show a supine AHI of 5.3 and supine RDI of 22.9/h.  When while sleeping on the right the patient had no apnea at all and while sleeping on the left an AHI of 9.3/h was noted.  So overall nonsupine sleep does benefit the patient.  Snoring was moderate in severity of his 40 dB mean volume.  Only 1% of total sleep time was accompanied by snoring.                               REM pAHI:                                                 NREM pAHI:  Oxygen Saturation Statistics:   O2 Saturation Range (%): Range between a nadir of 89% of the maximum 100% with a mean saturation of 97%.  The patient's oxygen saturation always states at or above 89%.                                     O2 Saturation (minutes) <89%:   0 minutes        Pulse Rate Statistics:    Pulse Range:   Pulse ranged between 53 and 103 bpm with a mean heart rate of 68 bpm.  Please note this is home sleep test is not able to identify cardiac arrhythmias only heart rate.              IMPRESSION:  This HST confirms the presence of of moderate snoring but only mild very mild sleep apnea.  I reviewed the sleep data before and after the patient indicated that she used her mouthguard or facemask.  It appears that this change took place around 2 AM, the first half of the night was with mouthguard for the second half without.  I see no major improvement and the patient slept without the mouthguard REM sleep was noted before and after, there were no snoring or positional data recorded after 11:00.  I would base any further recommendations on the patient's perception if the taped mouthguard prevents her from snoring or not.  This very mild degree of apnea would not require CPAP intervention as long as the patient could stay in the nonsupine sleep position.   RECOMMENDATION:  I like to confirm that the patient changed the mouth guard at 2 AM and took it off.  I would  base any further recommendations on the patient's perception if the taped mouthguard prevents her from snoring or not.  This very mild degree of apnea would not require CPAP intervention as long as the patient could stay in the nonsupine sleep position.    INTERPRETING PHYSICIAN:   Larey Seat, MD   Medical Director of [facility] at [facility].

## 2020-12-12 NOTE — Progress Notes (Signed)
Occupational Therapy Session Note  Patient Details  Name: Jacqueline Bonilla MRN: [id number] Date of Birth: 01-Dec-1955  Today's Date: 12/12/2020 OT Individual Time: 1302-1402 OT Individual Time Calculation (min): 60 min    Short Term Goals: Week 1:  OT Short Term Goal 1 (Week 1): Pt will complete UB dressing with supervision. OT Short Term Goal 2 (Week 1): Pt will complete LB dressing with min assist. OT Short Term Goal 3 (Week 1): Pt will complete toileting tasks with supervison and no assistive device. OT Short Term Goal 4 (Week 1): Pt will complete two grooming tasks at supervision level in standing.  Skilled Therapeutic Interventions/Progress Updates:    Pt completed bathing and dressing during session.  She was able to ambulate to the shower with min guard assist and no device with removal of all clothing at the same level.  She completed shower sit to stand with min guard assist using the grab bars for support.  Increased difficulty note motor planning when attempting to hold the shower head and rinse her body as she would use the LUE and then hold the shower head pointed behind her head and not on it.  Mod demonstrational cueing needed to correct.  Mod instructional cueing also for thoroughness for top down approach as she would randomly wash body parts.  Once shower was complete, she needed mod demonstrational cueing for thoroughness with drying off and then she was able to ambulate out to the EOB with min guard for dressing.  Min assist for donning pullover shirt secondary to decrease orientation and motor planning.  She also needed min assist for donning her underpants and pants with mod demonstrational cueing secondary to getting the right leg in the left leg opening.  She was able to correct this when donning her underpants but not with her pants after multiple attempts.  She was able to donn her socks and slip on shoes with min assist as well for orientation.  Finished session with  ambulation over to the sink where she worked on combing and drying her hair as well as brushing her teeth with increased time and setup.  She was left sitting in her wheelchair at the end of the session with safety belt in place and call button in reach.    Therapy Documentation Precautions:  Precautions Precautions: Fall, Other (comment) Precaution Comments: apraxic, ataxic Restrictions Weight Bearing Restrictions: No   Pain: Pain Assessment Pain Scale: Faces Pain Score: 0-No pain ADL: See Care Tool Section for some details of mobility and selfcare   Therapy/Group: Individual Therapy  Geral Tuch OTR/L 12/12/2020, 2:02 PM

## 2020-12-12 NOTE — Progress Notes (Signed)
PROGRESS NOTE   Subjective/Complaints:  Taking melatonin 5mg  at home for sleep, started here in hospital   ROS-  Pt denies SOB, abd pain, CP, N/V/C/D, and vision changes  Objective:   No results found. No results for input(s): WBC, HGB, HCT, PLT in the last 72 hours.  No results for input(s): NA, K, CL, CO2, GLUCOSE, BUN, CREATININE, CALCIUM in the last 72 hours.   Intake/Output Summary (Last 24 hours) at 12/12/2020 0914 Last data filed at 12/11/2020 2031 Gross per 24 hour  Intake 600 ml  Output --  Net 600 ml         Physical Exam: Vital Signs Blood pressure 110/73, pulse 80, temperature 98 F (36.7 C), resp. rate 18, height 5\' 4"  (1.626 m), weight 49.3 kg, last menstrual period 01/02/2003, SpO2 98 %.  General: No acute distress Mood and affect are appropriate Heart: Regular rate and rhythm no rubs murmurs or extra sounds Lungs: Clear to auscultation, breathing unlabored, no rales or wheezes Abdomen: Positive bowel sounds, soft nontender to palpation, nondistended Extremities: No clubbing, cyanosis, or edema Skin: No evidence of breakdown, no evidence of rash   Neurologic: Cranial nerves II through XII intact, motor strength is 5/5 in bilateral deltoid, bicep, tricep, grip, hip flexor, knee extensors, ankle dorsiflexor and plantar flexor Sensory exam normal sensation to light touch and proprioception in bilateral upper and lower extremities Cerebellar exam normal finger to nose to finger as well as heel to shin in bilateral upper and lower extremities Neg dysdiadochokinesis , in UEs, finger to thumb opposition mildly reduced on RIght side  Musculoskeletal: Full range of motion in all 4 extremities. No joint swelling   Assessment/Plan: 1. Functional deficits which require 3+ hours per day of interdisciplinary therapy in a comprehensive inpatient rehab setting. Physiatrist is providing close team supervision  and 24 hour management of active medical problems listed below. Physiatrist and rehab team continue to assess barriers to discharge/monitor patient progress toward functional and medical goals  Care Tool:  Bathing  Bathing activity did not occur: Safety/medical concerns Body parts bathed by patient: Right arm, Left arm, Chest, Abdomen, Front perineal area, Buttocks, Right upper leg, Left upper leg, Left lower leg, Right lower leg, Face         Bathing assist Assist Level: Minimal Assistance - Patient > 75%     Upper Body Dressing/Undressing Upper body dressing Upper body dressing/undressing activity did not occur (including orthotics): Safety/medical concerns What is the patient wearing?: Pull over shirt    Upper body assist Assist Level: Moderate Assistance - Patient 50 - 74%    Lower Body Dressing/Undressing Lower body dressing    Lower body dressing activity did not occur: Safety/medical concerns What is the patient wearing?: Underwear/pull up, Pants     Lower body assist Assist for lower body dressing: Independent     Toileting Toileting    Toileting assist Assist for toileting: Contact Guard/Touching assist     Transfers Chair/bed transfer  Transfers assist     Chair/bed transfer assist level: Contact Guard/Touching assist     Locomotion Ambulation   Ambulation assist      Assist level: Contact Guard/Touching assist Assistive device:  No Device Max distance: 188ft   Walk 10 feet activity   Assist     Assist level: Contact Guard/Touching assist Assistive device: Hand held assist   Walk 50 feet activity   Assist    Assist level: Minimal Assistance - Patient > 75% Assistive device: Hand held assist    Walk 150 feet activity   Assist    Assist level: Minimal Assistance - Patient > 75% Assistive device: Hand held assist    Walk 10 feet on uneven surface  activity   Assist     Assist level: Minimal Assistance - Patient >  75% Assistive device: Hand held assist   Wheelchair     Assist Is the patient using a wheelchair?: No             Wheelchair 50 feet with 2 turns activity    Assist            Wheelchair 150 feet activity     Assist          Blood pressure 110/73, pulse 80, temperature 98 F (36.7 C), resp. rate 18, height 5\' 4"  (1.626 m), weight 49.3 kg, last menstrual period 01/02/2003, SpO2 98 %.  Medical Problem List and Plan: 1.  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  Antithrombotics: -DVT/anticoagulation:  Mechanical:  Antiembolism stockings, knee (TED hose) Bilateral lower extremities             -antiplatelet therapy: Aspirin 81 mg daily and Brilinta 90 mg twice daily 3. Pain Management: Tylenol as needed  12/10- pt doesn't like even tylenol- will try Lidoderm patches 2 of them for back/hip pain- 8pm to 8am and monitor 4. Mood: Provide emotional support             -antipsychotic agents: N/A 5. Neuropsych: This patient is capable of making decisions on her own behalf. 6. Skin/Wound Care: Routine skin checks 7. Fluids/Electrolytes/Nutrition: Routine in and outs with follow-up chemistries 8.  Seizure prophylaxis.  EEG negative.  Keppra 500 mg twice daily 9.  Hypertension.  Cozaar 25 mg daily.  Monitor with increased mobility Vitals:   12/11/20 1938 12/12/20 0316  BP: 102/67 110/73  Pulse: 84 80  Resp: 18 18  Temp: 98 F (36.7 C) 98 F (36.7 C)  SpO2: 100% 98%   Would avoid hypotension , reduced losartan   12/10- BP still soft- just had decrease this AM, prior to  receiving AM meds- so will wait to monitor trend before reduction more.  10.  Hyperlipidemia.  Lipitor 11.  History of thrombocytopenia.  Latest platelet count 208,000. 12.  Urinary retention.  d/ced foley Urecholine 5 mg 3 times daily, Flomax 0.4 mg daily.    D/c flomax and monitor  , if still emptying well , can work on weaning urecholine as well     LOS: 4 days A FACE TO FACE EVALUATION WAS PERFORMED  Charlett Blake 12/12/2020, 9:14 AM

## 2020-12-12 NOTE — Progress Notes (Signed)
IMPRESSION:  This HST confirms the presence of of moderate snoring but only mild very mild sleep apnea.  I reviewed the sleep data before and after the patient indicated that she used her mouthguard or facemask.  It appears that this change took place around 2 AM, the first half of the night was with mouthguard for the second half without.  I see no major improvement and the patient slept without the mouthguard REM sleep was noted before and after, there were no snoring or positional data recorded after 11:00.  I would base any further recommendations on the patient's perception if the taped mouthguard prevents her from snoring or not.  This very mild degree of apnea would not require CPAP intervention as long as the patient could stay in the nonsupine sleep position.  RECOMMENDATION:  I like to confirm that the patient changed the mouth guard at 2 AM and took it off at that time. We don't have any positional and snoring data for that second part, and only recorded these functions until 23.00 hours. I would base any further recommendations on the patient's perception if the taped mouthguard prevents her from snoring or not.   This very mild degree of apnea would not require CPAP intervention as long as the patient could stay in the nonsupine sleep position.

## 2020-12-12 NOTE — Progress Notes (Signed)
Physical Therapy Session Note  Patient Details  Name: Jacqueline Bonilla MRN: [id number] Date of Birth: [birth year]  Today's Date: 12/12/2020 PT Individual Time: 1000-1110 PT Individual Time Calculation (min): 70 min   Short Term Goals: Week 1:  PT Short Term Goal 1 (Week 1): STG= LTG based on ELOS  Skilled Therapeutic Interventions/Progress Updates:    Patient received sitting up in recliner, agreeable to PT. She denies pain. Patient ambulating to bathroom with CGA. Continent void, supervision Probation officer and hygiene. Patient donning socks/sneakers with set up assist seated. Ambulating to therapy gym with no AD and CGA. Improving reciprocal arm swing noted. Patient with significant difficulty motor planning reciprocal stepping through agility ladder. No improvement noted with addition of weights for added proprioception. Patient with slightly increased ability to complete lateral stepping through ladder, but still required up to max verbal cues to motor plan. Patient completing random limits of control on Biodex with close supervision. 25% accuracy with maintaining dot within target with greatest difficulty with anterior weight shifts and weight shifts on diagonals. Patient 16% accurate with limits of stability with same difficulties as noted above. Also delayed processing of seeing dot on screen and reacting to shifting weight appropriately. Patient ambulating to dayroom with CGA and verbal cues needed for reciprocal arm swing. NuStep x12 mins complete with B UE/LE for large amplitude reciprocal patterning. Patient ambulating back to her room with close supervision and Min verbal cues for reciprocal arm swing. Patient transferring to toilet with supervision. Continent of bowel. Hand off to NT to continue care.   Therapy Documentation Precautions:  Precautions Precautions: Fall, Other (comment) Precaution Comments: apraxic, ataxic Restrictions Weight Bearing Restrictions:  No     Therapy/Group: Individual Therapy  Karoline Caldwell, PT, DPT, CBIS  12/12/2020, 7:38 AM

## 2020-12-12 NOTE — Progress Notes (Signed)
Physical Therapy Session Note  Patient Details  Name: Jacqueline Bonilla MRN: [id number] Date of Birth: 28-Dec-1955  Today's Date: 12/12/2020 PT Individual Time: 1400-1500 PT Individual Time Calculation (min): 60 min   Short Term Goals: Week 1:  PT Short Term Goal 1 (Week 1): STG= LTG based on ELOS  Skilled Therapeutic Interventions/Progress Updates:  Pt received seated in recliner in room handed off from OT session, agreeable to PT session. No complaints of pain. Sit to stand with close Supervision to CGA and no AD during session. Ambulation up to 1000 ft with no AD and CGA for balance during session. Pt exhibits some path deviation to the R as well as some R lateral lean during gait. Ambulation through obstacle course stepping over hurdles, up/down 4" step, and performing alt R/L LE cone taps. Pt struggles with most with performing cone taps without LOB and requires min A overall with no AD to complete obstacle course safely. Pt also exhibits some difficulty with motor planning tasks at times. Sidestep with cone taps L/R 3 x 10 ft each direction with CGA for balance. Pt able to safely retreive cones from the ground and place back on equipment shelf with min A for balance, encouragement to utilize BUE to retrieve cones. Session then focused on use of BITS to scan R visual field due to inattention.    BITS: visual scanning, letters A-Z 2:12 min, 5.27 sec reaction time  1:48 min, 4.16 sec reaction time  1:38 min, 3.75 sec reaction time  BITS: visual pursuit rotation, numbers 1-15 0.52 sec to complete, 3.44 sec reaction time 0.41 sec to complete, 2.7 sec reaction time  Pt exhibits most difficulty and increased time needed to find targets in RUQ but does exhibit decreased time needed to complete task and decreased reaction time with increased repetitions. Pt returned to room at end of session. Toilet transfer with CGA, min A needed for some clothing management and setup A for pericare. Pt  returned to recliner at end of session with needs in reach, quick release belt and chair alarm in place, family present.  Therapy Documentation Precautions:  Precautions Precautions: Fall, Other (comment) Precaution Comments: apraxic, ataxic Restrictions Weight Bearing Restrictions: No     Therapy/Group: Individual Therapy   Excell Seltzer, PT, DPT, CSRS  12/12/2020, 5:16 PM

## 2020-12-13 DIAGNOSIS — I63512 Cerebral infarction due to unspecified occlusion or stenosis of left middle cerebral artery: Secondary | ICD-10-CM | POA: Diagnosis not present

## 2020-12-13 NOTE — Progress Notes (Addendum)
PROGRESS NOTE   Subjective/Complaints:  Reviewed sleep study  Emptying bladder well off Flomax , PVR nl   ROS-  Pt denies SOB, abd pain, CP, N/V/C/D, and vision changes  Objective:   Home sleep test  Result Date: 12/12/2020 Jacqueline Seat, MD     12/12/2020 12:20 PM     [facility] TEST REPORT ( by Watch PAT)  STUDY DATE:  11-30-2020 DOB:  [birth year]  ORDERING CLINICIAN: Larey Seat, MD REFERRING CLINICIAN: Dr Jacqueline Ill, MD and Dr. Horald Pollen, MD  CLINICAL INFORMATION/HISTORY: Jacqueline Bonilla is a 65 y.o. Caucasian female and was seen in a sleep consultation on 11/17/2020 from Dr. Jaynee Bonilla upon her request for a sleep consultation. . Chief concern according to patient :  " looking for reasons for my strokes, last in June, with 3 new lesions on MRI "  husband Jacqueline Bonilla. Internal referral from Dr. Jaynee Bonilla for morning HA and witnessed sleep apnea. Pt no longer snoring or having sleep interruptions. Sleeps about 6 hrs straights. Pt is using kinesiology tape to keep her mouth closed.   Jacqueline Bonilla has a past medical history of Anemia, Anxiety, CVA- repeated cryptogenic strokes, 2022, Dysmenorrhea, Epistaxis, Endometriosis, Fibroid, Hypertension, Iron deficiency anemia, Microhematuria, Osteoporosis, Tachycardia, and Thrombocytopenia ([place]). Loop recorder was negative for atrial fibrillation, loop recorder was implanted 12 months ago.  Sleep relevant medical history: Nocturia- 2-5 times - but not since she tapes her jaw- insomnia. Uses a taped jaw guard for this HST for half of the night and the other half was recorded without .  Epworth sleepiness score: 3/24.  BMI: 20.3 kg/m  Neck Circumference: 13"  FINDINGS:  Sleep Summary:  Total Recording Time (hours, min): The total recording time for this home sleep test was 10 hours and 4 minutes of which total sleep time was estimated to be 8 hours 51 minutes.     Percent REM (%): 19.1% of sleep time were REM sleep.                                     Respiratory Indices:  Calculated pAHI (per hour): The calculated AHI was only 5.3/h but during REM sleep rose to 11.8/h in non-REM sleep the baseline AHI was only 4/h. Positional apnea data show a supine AHI of 5.3 and supine RDI of 22.9/h.  When while sleeping on the right the patient had no apnea at all and while sleeping on the left an AHI of 9.3/h was noted.  So overall nonsupine sleep does benefit the patient. Snoring was moderate in severity of his 40 dB mean volume.  Only 1% of total sleep time was accompanied by snoring.                           REM pAHI:  NREM pAHI:                                                                        Oxygen Saturation Statistics:  O2 Saturation Range (%): Range between a nadir of 89% of the maximum 100% with a mean saturation of 97%.  The patient's oxygen saturation always states at or above 89%.                                   O2 Saturation (minutes) <89%:   0 minutes      Pulse Rate Statistics:   Pulse Range:   Pulse ranged between 53 and 103 bpm with a mean heart rate of 68 bpm.  Please note this is home sleep test is not able to identify cardiac arrhythmias only heart rate.            IMPRESSION:  This HST confirms the presence of of moderate snoring but only mild very mild sleep apnea. I reviewed the sleep data before and after the patient indicated that she used her mouthguard or facemask.  It appears that this change took place around 2 AM, the first half of the night was with mouthguard for the second half without.  I see no major improvement and the patient slept without the mouthguard REM sleep was noted before and after, there were no snoring or positional data recorded after 11:00.  I would base any further recommendations on the patient's perception if the taped mouthguard prevents her from snoring or not.  This very mild  degree of apnea would not require CPAP intervention as long as the patient could stay in the nonsupine sleep position.  RECOMMENDATION:  I like to confirm that the patient changed the mouth guard at 2 AM and took it off. I would base any further recommendations on the patient's perception if the taped mouthguard prevents her from snoring or not.  This very mild degree of apnea would not require CPAP intervention as long as the patient could stay in the nonsupine sleep position.  INTERPRETING PHYSICIAN:  Jacqueline Seat, MD Medical Director of [facility].             No results for input(s): WBC, HGB, HCT, PLT in the last 72 hours.  No results for input(s): NA, K, CL, CO2, GLUCOSE, BUN, CREATININE, CALCIUM in the last 72 hours.   Intake/Output Summary (Last 24 hours) at 12/13/2020 0907 Last data filed at 12/13/2020 0900 Gross per 24 hour  Intake 720 ml  Output --  Net 720 ml         Physical Exam: Vital Signs Blood pressure 113/71, pulse 80, temperature 98.3 F (36.8 C), temperature source Oral, resp. rate 16, height 5\' 4"  (1.626 m), weight 49.3 kg, last menstrual period 01/02/2003, SpO2 99 %.   General: No acute distress Mood and affect are appropriate Heart: Regular rate and rhythm no rubs murmurs or extra sounds Lungs: Clear to auscultation, breathing unlabored, no rales or wheezes Abdomen: Positive bowel sounds, soft nontender to palpation, nondistended Extremities: No clubbing, cyanosis, or edema Skin: No evidence of breakdown, no evidence of rash   Neurologic:  Cranial nerves II through XII intact, motor strength is 5/5 in bilateral deltoid, bicep, tricep, grip, hip flexor, knee extensors, ankle dorsiflexor and plantar flexor Sensory exam normal sensation to light touch and proprioception in bilateral upper and lower extremities Cerebellar exam normal finger to nose to finger as well as heel to shin in bilateral upper and lower extremities Neg dysdiadochokinesis , in  UEs, finger to thumb opposition mildly reduced on RIght side  Musculoskeletal: Full range of motion in all 4 extremities. No joint swelling   Assessment/Plan: 1. Functional deficits which require 3+ hours per day of interdisciplinary therapy in a comprehensive inpatient rehab setting. Physiatrist is providing close team supervision and 24 hour management of active medical problems listed below. Physiatrist and rehab team continue to assess barriers to discharge/monitor patient progress toward functional and medical goals  Care Tool:  Bathing    Body parts bathed by patient: Right arm, Left arm, Chest, Abdomen, Front perineal area, Buttocks, Right upper leg, Left upper leg, Left lower leg, Right lower leg, Face         Bathing assist Assist Level: Contact Guard/Touching assist     Upper Body Dressing/Undressing Upper body dressing   What is the patient wearing?: Pull over shirt    Upper body assist Assist Level: Minimal Assistance - Patient > 75%    Lower Body Dressing/Undressing Lower body dressing      What is the patient wearing?: Underwear/pull up, Pants     Lower body assist Assist for lower body dressing: Moderate Assistance - Patient 50 - 74%     Toileting Toileting    Toileting assist Assist for toileting: Contact Guard/Touching assist     Transfers Chair/bed transfer  Transfers assist     Chair/bed transfer assist level: Contact Guard/Touching assist     Locomotion Ambulation   Ambulation assist      Assist level: Contact Guard/Touching assist Assistive device: No Device Max distance: 131ft   Walk 10 feet activity   Assist     Assist level: Contact Guard/Touching assist Assistive device: Hand held assist   Walk 50 feet activity   Assist    Assist level: Minimal Assistance - Patient > 75% Assistive device: Hand held assist    Walk 150 feet activity   Assist    Assist level: Minimal Assistance - Patient > 75% Assistive  device: Hand held assist    Walk 10 feet on uneven surface  activity   Assist     Assist level: Minimal Assistance - Patient > 75% Assistive device: Hand held assist   Wheelchair     Assist Is the patient using a wheelchair?: No             Wheelchair 50 feet with 2 turns activity    Assist            Wheelchair 150 feet activity     Assist          Blood pressure 113/71, pulse 80, temperature 98.3 F (36.8 C), temperature source Oral, resp. rate 16, height 5\' 4"  (1.626 m), weight 49.3 kg, last menstrual period 01/02/2003, SpO2 99 %.  Medical Problem List and Plan: 1.  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  Antithrombotics: -DVT/anticoagulation:  Mechanical:  Antiembolism stockings, knee (TED hose) Bilateral lower extremities             -antiplatelet therapy: Aspirin 81 mg daily and Brilinta 90 mg twice daily 3. Pain Management: Tylenol as needed  12/10- pt doesn't like even tylenol- will try Lidoderm patches 2 of them for back/hip pain- 8pm to 8am and monitor 4. Mood: Provide emotional support             -antipsychotic agents: N/A 5. Neuropsych: This patient is capable of making decisions on her own behalf. 6. Skin/Wound Care: Routine skin checks 7. Fluids/Electrolytes/Nutrition: Routine in and outs with follow-up chemistries 8.  Seizure prophylaxis.  EEG negative.  Keppra 500 mg twice daily 9.  Hypertension.  Cozaar 25 mg daily.  Monitor with increased mobility Vitals:   12/12/20 1938 12/13/20 0436  BP: 109/73 113/71  Pulse: 82 80  Resp: 18 16   Temp: 98.2 F (36.8 C) 98.3 F (36.8 C)  SpO2: 100% 99%   Would avoid hypotension , reduced losartan   12/10- BP still soft- just had decrease this AM, prior to receiving AM meds- so will wait to monitor trend before reduction more.  10.  Hyperlipidemia.  Lipitor 11.  History of thrombocytopenia.  Latest platelet count 208,000. 12.  Urinary retention.    Off bethanechol and tamulosin, 12/13 emptying well ,     LOS: 5 days A FACE TO [place] E Anne-Marie Genson 12/13/2020, 9:07 AM

## 2020-12-13 NOTE — Progress Notes (Addendum)
Occupational Therapy Session Note  Patient Details  Name: Jacqueline Bonilla MRN: [id number] Date of Birth: February 21, 1955  Today's Date: 12/13/2020 OT Individual Time: 0916-1020 OT Individual Time Calculation (min): 64 min    Short Term Goals: Week 1:  OT Short Term Goal 1 (Week 1): Pt will complete UB dressing with supervision. OT Short Term Goal 2 (Week 1): Pt will complete LB dressing with min assist. OT Short Term Goal 3 (Week 1): Pt will complete toileting tasks with supervison and no assistive device. OT Short Term Goal 4 (Week 1): Pt will complete two grooming tasks at supervision level in standing.  Skilled Therapeutic Interventions/Progress Updates:    Session 1: ([id number])  No report of pain during session.  Pt sitting in recliner to start with her spouse present.  She was agreeable to therapy with completion of functional mobility down to the ortho gym with min guard assist and no device.  Educated pt on her room number during mobility as she was unable to recall it.  Once in the gym had her work with use of the BITS in standing.  She completed 2 sets of 2 mins each using the Visual Scanning Program, locating the size 5 blue dots using the LUE only.  First set was only with 74% accuracy with 2.79 reaction time.  Second set reaction time improved to 2.4 seconds with 96% accuracy.  Next she completed [employer] where she had to draw a single and double line design on the right side of the screen to match the one on the left.  She was able to draw the single line consistently to match the diagram, but she was unable to draw a two line design with one line horizontal and the other perpendicular to match the diagram with multiple attempts and therapist guiding her as well.  Next, had her transition to the therapy kitchen where she was asked to find products in the pantry.  Increased difficulty locating products therapist asked for unless the side of the box was facing her with  the product name.  She also demonstrated moderate difficulty with visual spatial organization when asked to organize the items all in a row.  Finished session with return to the room with pt stating her room number.  Min questioning cueing to use external signs to turn the right way.  She was left sitting in the recliner to end session with the call button and phone in reach.    Session 2: ([id number])  Pt completed functional mobility to the bathroom with min guard to start and no assistive device.  She was able to complete clothing management and toilet hygiene with min guard sit to stand.  She ambulated out to the sink for washing her hands at the same level.  Next, had her ambulate to the dayroom where she worked on cognitive processing and visual perceptual tasks sitting at the high/low table.  She demonstrated moderate difficulty completing a design with use of the slider disc frame when given a diagram to reference to.  Therapist had to provide max instructional cueing to complete the 12 piece slider puzzle.  Also had her working on medication management, sorting pills into a weekly medicine box according to the directions on the medication.  Mod instructional cueing needed to understand and complete 1.5 days of only 1 medication.  Next, had her work on trying to [employer] but she was unable to complete more than 1 row, with mod demonstrational  cueing to complete that.  Finished perceptual tasks with folding a towel, which she was able to complete with increased time.  She ambulated down and back from the dayroom to the ADL apartment with min guard.  Noted increased veering to the right during mobility without pt demonstrating awareness to correct it.  Finished session with return to the room and pt sitting in the recliner with the call button and phone in reach and safety alarm belt in place.    Therapy Documentation Precautions:  Precautions Precautions: Fall, Other  (comment) Precaution Comments: apraxic, ataxic Restrictions Weight Bearing Restrictions: No   Therapy/Group: Individual Therapy  Bita Cartwright OTR/L 12/13/2020, 12:26 PM

## 2020-12-13 NOTE — Progress Notes (Signed)
Physical Therapy Session Note  Patient Details  Name: Jacqueline Bonilla MRN: [id number] Date of Birth: Feb 19, 1955  Today's Date: 12/13/2020 PT Individual Time: 1705-1735 PT Individual Time Calculation (min): 30 min   Short Term Goals: Week 1:  PT Short Term Goal 1 (Week 1): STG= LTG based on ELOS  Skilled Therapeutic Interventions/Progress Updates:    Pt received sitting in recliner and agreeable to therapy session. Vitals in sitting: BP 122/77 (MAP 87), HR 85bpm  Sit<>stands, no AD, with CGA for steadying throughout session. Gait training ~196ft to main therapy gym, no AD, with CGA for steadying - continues to demo R veering and inattention with pt coming close to bumping objects on that side and actually does bump her R leg on the side of the bed. Participated in dynamic gait and R UE NMR dual-task of walking ~59ft and turning to pick up a card, carry it with her R hand and place it on its pair requiring visual scanning - focused on dynamic balance of turning as pt frequently having minor LOB with this and reports feeling unsteady during this activity - requires increased time but is able to locate 8/9 card matches requiring cuing for only 1. Participated in standing dual-task of trying to create a pipe tree based on the picture (easy level difficulty of the rectangular shape) - pt able to place the 1st and 2nd piece with min/mod cuing but requires max/total cuing to orient the 3rd piece horizontally instead of vertically. Gait back to room, no AD, as described above - pt able to recall her room number and navigate back to it based on memory (did not use signs in environment). Gait in/out bathroom, no AD, with CGA for steadying. Standing with close supervision for balance performed LB clothing management without assist and continent of bladder. Standing hand hygiene at sink close supervision. Pt left seated in recliner with needs in reach and seat belt alarm on.  Therapy Documentation Precautions:   Precautions Precautions: Fall, Other (comment) Precaution Comments: apraxic, ataxic Restrictions Weight Bearing Restrictions: No   Pain:  No complaints of pain. Appears to have decreased pain with sit<>stands compared to last time seen by this therapist and when questioned pt confirmed it is "feeling better."    Therapy/Group: Individual Therapy  Tawana Scale , PT, DPT, NCS, CSRS  12/13/2020, 3:42 PM

## 2020-12-13 NOTE — Progress Notes (Signed)
Speech Language Pathology Daily Session Note  Patient Details  Name: Jacqueline Bonilla MRN: [id number] Date of Birth: 05/13/1955  Today's Date: 12/13/2020 SLP Individual Time: 1115-1200 SLP Individual Time Calculation (min): 45 min  Short Term Goals: Week 1: SLP Short Term Goal 1 (Week 1): Patient will utilize communication strategies to overcome word finding difficulty at conversation level with min A verbal cues. SLP Short Term Goal 2 (Week 1): Patient will complete mildly complex problem solving with min A verbal cues SLP Short Term Goal 3 (Week 1): Patient will demonstrate awareness to errors and ability to generate appropriate solutions with min A verbal cues SLP Short Term Goal 4 (Week 1): Patient will demonstrate use of compensatory memory strategies during functional tasks with min A verbal cues  Skilled Therapeutic Interventions: Skilled ST treatment focused on cognitive goals. SLP facilitated session by providing sup A verbal cues for working memory and visual scanning task using BITS task to successfully recall up to 6 words in sequential order up to 7 times. Patient demonstrated good selective attention by completing task in mildly distracting environment without redirection. Patient was left in recliner with alarm activated and immediate needs within reach at end of session. Continue per current plan of care.      Pain  No pain   Therapy/Group: Individual Therapy  [name] 12/13/2020, 3:46 PM

## 2020-12-14 ENCOUNTER — Telehealth: Payer: Self-pay | Admitting: Neurology

## 2020-12-14 NOTE — Progress Notes (Signed)
PROGRESS NOTE   Subjective/Complaints:   Discussed d/c date and persistent deficits   ROS-  Pt denies SOB, abd pain, CP, N/V/C/D, and vision changes  Objective:   Home sleep test  Result Date: 12/12/2020 Larey Seat, MD     12/12/2020 12:20 PM     [facility] TEST REPORT ( by Watch PAT)  STUDY DATE:  11-30-2020 DOB:  February 04, 1955  ORDERING CLINICIAN: Larey Seat, MD REFERRING CLINICIAN: Dr Sarina Ill, MD and Dr. Horald Pollen, MD  CLINICAL INFORMATION/HISTORY: Jacqueline Bonilla is a 65 y.o. Caucasian female and was seen in a sleep consultation on 11/17/2020 from [name] upon her request for a sleep consultation. . Chief concern according to patient :  " looking for reasons for my strokes, last in June, with 3 new lesions on MRI "  husband Jacqueline Bonilla. Internal referral from [name] for morning HA and witnessed sleep apnea. Pt no longer snoring or having sleep interruptions. Sleeps about 6 hrs straights. Pt is using kinesiology tape to keep her mouth closed.   Jacqueline Bonilla has a past medical history of Anemia, Anxiety, CVA- repeated cryptogenic strokes, 2022, Dysmenorrhea, Epistaxis, Endometriosis, Fibroid, Hypertension, Iron deficiency anemia, Microhematuria, Osteoporosis, Tachycardia, and Thrombocytopenia ([place]). Loop recorder was negative for atrial fibrillation, loop recorder was implanted 12 months ago.  Sleep relevant medical history: Nocturia- 2-5 times - but not since she tapes her jaw- insomnia. Uses a taped jaw guard for this HST for half of the night and the other half was recorded without .  Epworth sleepiness score: 3/24.  BMI: 20.3 kg/m  Neck Circumference: 13"  FINDINGS:  Sleep Summary:  Total Recording Time (hours, min): The total recording time for this home sleep test was 10 hours and 4 minutes of which total sleep time was estimated to be 8 hours 51 minutes.    Percent REM (%): 19.1%  of sleep time were REM sleep.                                     Respiratory Indices:  Calculated pAHI (per hour): The calculated AHI was only 5.3/h but during REM sleep rose to 11.8/h in non-REM sleep the baseline AHI was only 4/h. Positional apnea data show a supine AHI of 5.3 and supine RDI of 22.9/h.  When while sleeping on the right the patient had no apnea at all and while sleeping on the left an AHI of 9.3/h was noted.  So overall nonsupine sleep does benefit the patient. Snoring was moderate in severity of his 40 dB mean volume.  Only 1% of total sleep time was accompanied by snoring.                           REM pAHI:  NREM pAHI:                                                                        Oxygen Saturation Statistics:  O2 Saturation Range (%): Range between a nadir of 89% of the maximum 100% with a mean saturation of 97%.  The patient's oxygen saturation always states at or above 89%.                                   O2 Saturation (minutes) <89%:   0 minutes      Pulse Rate Statistics:   Pulse Range:   Pulse ranged between 53 and 103 bpm with a mean heart rate of 68 bpm.  Please note this is home sleep test is not able to identify cardiac arrhythmias only heart rate.            IMPRESSION:  This HST confirms the presence of of moderate snoring but only mild very mild sleep apnea. I reviewed the sleep data before and after the patient indicated that she used her mouthguard or facemask.  It appears that this change took place around 2 AM, the first half of the night was with mouthguard for the second half without.  I see no major improvement and the patient slept without the mouthguard REM sleep was noted before and after, there were no snoring or positional data recorded after 11:00.  I would base any further recommendations on the patient's perception if the taped mouthguard prevents her from snoring or not.  This very mild degree of apnea would not  require CPAP intervention as long as the patient could stay in the nonsupine sleep position.  RECOMMENDATION:  I like to confirm that the patient changed the mouth guard at 2 AM and took it off. I would base any further recommendations on the patient's perception if the taped mouthguard prevents her from snoring or not.  This very mild degree of apnea would not require CPAP intervention as long as the patient could stay in the nonsupine sleep position.  INTERPRETING PHYSICIAN:  Larey Seat, MD Medical Director of [facility].             No results for input(s): WBC, HGB, HCT, PLT in the last 72 hours.  No results for input(s): NA, K, CL, CO2, GLUCOSE, BUN, CREATININE, CALCIUM in the last 72 hours.   Intake/Output Summary (Last 24 hours) at 12/14/2020 1046 Last data filed at 12/14/2020 1004 Gross per 24 hour  Intake 840 ml  Output --  Net 840 ml         Physical Exam: Vital Signs Blood pressure 124/68, pulse 73, temperature 97.6 F (36.4 C), temperature source Oral, resp. rate 18, height 5' 4"  (1.626 m), weight 49.3 kg, last menstrual period 01/02/2003, SpO2 98 %.  General: No acute distress Mood and affect are appropriate Heart: Regular rate and rhythm no rubs murmurs or extra sounds Lungs: Clear to auscultation, breathing unlabored, no rales or wheezes Abdomen: Positive bowel sounds, soft nontender to palpation, nondistended Extremities: No clubbing, cyanosis, or edema Skin: No evidence of breakdown, no evidence of rash  Neurologic: Cranial nerves II through XII intact, motor strength is 5/5 in bilateral deltoid, bicep, tricep, grip, hip flexor, knee extensors, ankle dorsiflexor and plantar flexor Sensory exam normal sensation to light touch and proprioception in bilateral upper and lower extremities Cerebellar exam normal finger to nose to finger as well as heel to shin in bilateral upper and lower extremities Neg dysdiadochokinesis , in UEs, finger to thumb  opposition mildly reduced on RIght side  Musculoskeletal: Full range of motion in all 4 extremities. No joint swelling   Assessment/Plan: 1. Functional deficits which require 3+ hours per day of interdisciplinary therapy in a comprehensive inpatient rehab setting. Physiatrist is providing close team supervision and 24 hour management of active medical problems listed below. Physiatrist and rehab team continue to assess barriers to discharge/monitor patient progress toward functional and medical goals  Care Tool:  Bathing    Body parts bathed by patient: Right arm, Left arm, Chest, Abdomen, Front perineal area, Buttocks, Right upper leg, Left upper leg, Left lower leg, Right lower leg, Face         Bathing assist Assist Level: Contact Guard/Touching assist     Upper Body Dressing/Undressing Upper body dressing   What is the patient wearing?: Pull over shirt    Upper body assist Assist Level: Minimal Assistance - Patient > 75%    Lower Body Dressing/Undressing Lower body dressing      What is the patient wearing?: Underwear/pull up, Pants     Lower body assist Assist for lower body dressing: Moderate Assistance - Patient 50 - 74%     Toileting Toileting    Toileting assist Assist for toileting: Contact Guard/Touching assist     Transfers Chair/bed transfer  Transfers assist     Chair/bed transfer assist level: Contact Guard/Touching assist     Locomotion Ambulation   Ambulation assist      Assist level: Contact Guard/Touching assist Assistive device: No Device Max distance: 159f   Walk 10 feet activity   Assist     Assist level: Contact Guard/Touching assist Assistive device: Hand held assist   Walk 50 feet activity   Assist    Assist level: Minimal Assistance - Patient > 75% Assistive device: Hand held assist    Walk 150 feet activity   Assist    Assist level: Minimal Assistance - Patient > 75% Assistive device: Hand held  assist    Walk 10 feet on uneven surface  activity   Assist     Assist level: Minimal Assistance - Patient > 75% Assistive device: Hand held assist   Wheelchair     Assist Is the patient using a wheelchair?: No             Wheelchair 50 feet with 2 turns activity    Assist            Wheelchair 150 feet activity     Assist          Blood pressure 124/68, pulse 73, temperature 97.6 F (36.4 C), temperature source Oral, resp. rate 18, height 5' 4"  (1.626 m), weight 49.3 kg, last menstrual period 01/02/2003, SpO2 98 %.  Medical Problem List and Plan: 1.  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 Formulized individual treatment plan based on medical history, underlying problem and comorbidities.   Continue CIR- PT, OT and SLP 2.  Antithrombotics: -DVT/anticoagulation:  Mechanical:  Antiembolism stockings, knee (TED hose) Bilateral lower extremities             -antiplatelet therapy: Aspirin 81 mg daily and Brilinta 90 mg twice daily 3. Pain Management: Tylenol as needed  12/10- pt doesn't like even tylenol- will try Lidoderm patches 2 of them for back/hip pain- 8pm to 8am and monitor 4. Mood: Provide emotional support             -antipsychotic agents: N/A 5. Neuropsych: This patient is capable of making decisions on her own behalf. 6. Skin/Wound Care: Routine skin checks 7. Fluids/Electrolytes/Nutrition: Routine in and outs with follow-up chemistries 8.   Seizure prophylaxis.  EEG negative.  Keppra 500 mg twice daily 9.  Hypertension.  Cozaar 25 mg daily.  Monitor with increased mobility Vitals:   12/13/20 1924 12/14/20 0421  BP: 120/65 124/68  Pulse: 91 73  Resp: 18 18  Temp: 98.1 F (36.7 C) 97.6 F (36.4 C)  SpO2: 100% 98%  12/14 controlled   10.  Hyperlipidemia.  Lipitor 11.  History of thrombocytopenia.  Latest platelet count 208,000. 12.  Urinary retention.    Off bethanechol and tamulosin, 12/13 emptying well ,     LOS: 6 days A FACE TO [place] E Quency Tober 12/14/2020, 10:46 AM

## 2020-12-14 NOTE — Progress Notes (Signed)
Physical Therapy Session Note  Patient Details  Name: Jacqueline Bonilla MRN: [id number] Date of Birth: [birth year]  Today's Date: 12/14/2020 PT Individual Time: 1006-1034 PT Individual Time Calculation (min): 28 min   Short Term Goals: Week 1:  PT Short Term Goal 1 (Week 1): STG= LTG based on ELOS  Skilled Therapeutic Interventions/Progress Updates:    Pt received in recliner and agreeable to therapy.  No complaint of pain. Pt ambulated to therapy gym with CGA and no AD. Noted decr arm swing and flexor synergy on RUE. Pt then directed in cone taps, first with HHA transitioning to using dowels for less stable BUE support. Pt demoed improved balance and coordination with dowel support. Pt then directed in single leg activity transitioning from split stance to march with dowels for UE support for coordination and dynamic balance. Pt with incr difficulty while using RLE. During second set, VC/tc for trunk elongation in split stance to promote reciprocal motion for gait. Pt then ambulated back to room, therapist using hockey sticks to promote natural arm swing during gait. Pt then ambulated without hockey sticks and demoed goof carryover. Pt requested to use the bathroom and did so with min A clothing management and tot A for hygiene. Pt returned to recliner in the same manner and was left with all needs in reach and alarm active.   Therapy Documentation Precautions:  Precautions Precautions: Fall, Other (comment) Precaution Comments: apraxic, ataxic Restrictions Weight Bearing Restrictions: No     Therapy/Group: Individual Therapy  Mickel Fuchs 12/14/2020, 12:26 PM

## 2020-12-14 NOTE — Progress Notes (Signed)
Physical Therapy Session Note  Patient Details  Name: Jacqueline Bonilla MRN: [id number] Date of Birth: 1955-03-25  Today's Date: 12/14/2020 PT Individual Time: 6144-3154 PT Individual Time Calculation (min): 74 min   Short Term Goals: Week 1:  PT Short Term Goal 1 (Week 1): STG= LTG based on ELOS  Skilled Therapeutic Interventions/Progress Updates:    Pt received finishing toileting as hand-off from SLP. Pt agreeable to this therapy session therefore this therapist assumed care of the patient. Completed hand hygiene standing at sink with supervision for balance safety. Gait training ~141ft to main therapy gym holding hockey sticks in B UEs to allow therapist to facilitate reciprocal arm swing during gait. Gait training 23ft to ADL apartment, no AD, with pt demoing some carryover with increased R arm swing noted though still less than L UE - CGA/close supervision for safety.  In ADL apartment, focused on dynamic standing balance with dual-cognitive challenge of identifying items in the pantry that were incorrectly placed there (ex: frozen pizza, guacamole, etc.) while targeting R UE NMR to grasp items with R hand and visual impairments of having to read and orient the items "upright" (pt frequently lying them on their side or face down) - pt able to correctly identify the familiar items that needed to be refrigerated vs frozen and place them in the correct place; however, has more difficulty correctly orienting the items upright (in real life situation the contents inside would have spilled out).   Gait ~124ft to main therapy gym, no AD, with close supervision/CGA - pt demos increased R lateral lean today.  Dynamic standing balance via 4- corner foot tap to external target of numbered and colored disks on verbal command - continuously demos mild R anterior LOB with pt crouching down when feeling unstable/off balance requiring constant CGA with intermittent min assist for balance - pt also states  she feels "unstable" and reports she is also fatigued at this time. Despite this, pt agreeable to continue with therapy session. Progressed to stepping to the external targets on verbal command - continues to crouch down with R lateral lean when feeling "unstable" requiring min assist to maintain balance with repeated verbal/tactile cuing to stand upright.  Dynamic gait training through agility ladder: - forward step-to leading with R LE - initially requiring step-by-step visual cuing to locate the next open square progressed to only verbal cuing  - side stepping 1x towards R with visual cuing to locate the next open square as pt often stepping on the ladder - continues to require min assist for balance due to R lateral lean  Dynamic gait training via cone weaving forward and then backwards, no UE support, requiring CGA forward and min assist backwards due to continued R anterior lean with pt crouching down when feeling unstable and taking very short, shuffled steps backwards.  Pt reports significant fatigue this afternoon, which is likely contributing to her worsening R anterior lean/LOB this session. At end, pt left seated in recliner with needs in reach and seat belt alarm on.    Therapy Documentation Precautions:  Precautions Precautions: Fall, Other (comment) Precaution Comments: apraxic, ataxic Restrictions Weight Bearing Restrictions: No   Pain: Reports she had medication for the pain in her hips/buttocks and states it helped; otherwise, no reports of pain during session.    Therapy/Group: Individual Therapy  Tawana Scale , PT, DPT, NCS, CSRS  12/14/2020, 12:26 PM

## 2020-12-14 NOTE — Progress Notes (Signed)
Speech Language Pathology Daily Session Note  Patient Details  Name: Jacqueline Bonilla MRN: [id number] Date of Birth: 1955/07/13  Today's Date: 12/14/2020 SLP Individual Time: 1300-1345 SLP Individual Time Calculation (min): 45 min  Short Term Goals: Week 1: SLP Short Term Goal 1 (Week 1): Patient will utilize communication strategies to overcome word finding difficulty at conversation level with min A verbal cues. SLP Short Term Goal 2 (Week 1): Patient will complete mildly complex problem solving with min A verbal cues SLP Short Term Goal 3 (Week 1): Patient will demonstrate awareness to errors and ability to generate appropriate solutions with min A verbal cues SLP Short Term Goal 4 (Week 1): Patient will demonstrate use of compensatory memory strategies during functional tasks with min A verbal cues  Skilled Therapeutic Interventions: Skilled ST treatment focused on cognitive and speech goals. SLP educated on word finding strategies using semantic feature analysis in which patient executed with supervision assist. SLP also facilitated mildly complex calendar organization and problem solving task with min A verbal cues, additional processing time, and verbal repetition. Pt continues to report decreased recall of day-to-day events as evidenced by not recalling what she did in therapy in previous day, and even throughout the day. SLP discussed consideration of a memory notebook and various other compensatory memory strategies. Will discuss further during future sessions. Patient was passed off to PT at end of session. Continue per current plan of care.      Pain    Therapy/Group: Individual Therapy  Husna Krone T Gloria Ricardo 12/14/2020, 1:30 PM

## 2020-12-14 NOTE — Telephone Encounter (Signed)
-----   Message from Larey Seat, MD sent at 12/12/2020 12:22 PM EST ----- IMPRESSION:  This HST confirms the presence of of moderate snoring but only mild very mild sleep apnea.  I reviewed the sleep data before and after the patient indicated that she used her mouthguard or facemask.  It appears that this change took place around 2 AM, the first half of the night was with mouthguard for the second half without.  I see no major improvement and the patient slept without the mouthguard REM sleep was noted before and after, there were no snoring or positional data recorded after 11:00.  I would base any further recommendations on the patient's perception if the taped mouthguard prevents her from snoring or not.  This very mild degree of apnea would not require CPAP intervention as long as the patient could stay in the nonsupine sleep position.  RECOMMENDATION:  I like to confirm that the patient changed the mouth guard at 2 AM and took it off at that time. We don't have any positional and snoring data for that second part, and only recorded these functions until 23.00 hours. I would base any further recommendations on the patient's perception if the taped mouthguard prevents her from snoring or not.   This very mild degree of apnea would not require CPAP intervention as long as the patient could stay in the nonsupine sleep position.

## 2020-12-14 NOTE — Telephone Encounter (Signed)
Called and spoke with the husband about the patient's sleep study results.  Advised overall there was indication of mild sleep apnea.  She states that with mild degree of sleep apnea it would not require CPAP intervention that she would recommend staying on her side when she sleeps.  Patient uses a taped mouthguard and confirmed that it was used for the first part of the study as Dr. Brett Fairy mentioned.  Advised that Dr. Brett Fairy did not see any change from before or after using the tape.  Patient's husband verbalized understanding of the results and had no further questions at this time.  Was advised to contact our office if they have any other questions or concerns.

## 2020-12-14 NOTE — Patient Care Conference (Signed)
Inpatient RehabilitationTeam Conference and Plan of Care Update Date: 12/14/2020   Time: 10:49 AM    Patient Name: Jacqueline Bonilla      Medical Record Number: [id number]  Date of Birth: June 27, 1955 Sex: Female         Room/Bed: 4W21C/4W21C-01 Payor Info: Payor: [employer] MEDICARE / Plan: [place] HMO / Product Type: *No Product type* /    Admit Date/Time:  12/08/2020  2:54 PM  Primary Diagnosis:  Left middle cerebral artery stroke [facility])  Hospital Problems: Principal Problem:   Left middle cerebral artery stroke [facility])    Expected Discharge Date: Expected Discharge Date: 12/17/20  Team Members Present: Physician leading conference: Dr. Alysia Penna Social Worker Present: Erlene Quan, BSW Nurse Present: Dorien Chihuahua, RN PT Present: Page Spiro, PT OT Present: Clyda Greener, OT SLP Present: Sherren Kerns, SLP PPS Coordinator present : Gunnar Fusi, SLP     Current Status/Progress Goal Weekly Team Focus  Bowel/Bladder   pt cont of b and b, lbm 12/13/20  remain cont of b and b  asses q shift and prn   Swallow/Nutrition/ Hydration   N/A  N/A  N/A   ADL's   Pt currently superviison for UB bathing with min guard for LB bathing.  Min assist for UB and LB dressing secondary to motor planning difficulties.  Slight RUE deficits which were pre-existing per her spouse.  Visual perceptual deficits  supervision level overall  selfcare retraining, cognitive retraining, balance retraining, visual retraining, DME education, pt/family education   Mobility   supervision bed mobility, CGA sit<>stand and stand pivot transfers without AD, CGA gait up to 24ft without AD - R inattention with visual perception impairments as well as significant apraxia  supervision overall at ambulatory level  dynamic gait training, activity tolerance, R attention, R UE NMR, dynamic standing balance, pt/family education, motor planning and sequencing   Communication   sup-to-min A  sup A  word  finding strategies   Safety/Cognition/ Behavioral Observations  min A  sup A  working memory and short-term recall, processing, error awareness, problem solving   Pain   pt has no current c/o pain         Skin   no current skin break down or infection  remain free from [place] or infection  ass q shift and prn     Discharge Planning:  Discharging home with spouse with 24/7 supervision   Team Discussion: Patient with chronic aphasia from previous stroke and poor error awareness, apraxia, problem solving deficits, word finding issues and motor planning deficits post MCA CVA.  Patient on target to meet rehab goals: yes, currently needs supervision - min assist for bathing and dressing with cues (demonstrational +verbal) due to apraxia and motor planning issues. Needs min guard assist without an assistive device for transfers and ambulation; tends to veer to the right. Needs CGA without using an assistive device. Goals for discharge set for supervision overall.  *See Care Plan and progress notes for long and short-term goals.   Revisions to Treatment Plan:  N/A   Teaching Needs: Safety, transfers, medication management, etc  Current Barriers to Discharge: Decreased caregiver support  Possible Resolutions to Barriers: Family education with spouse OP follow up services recommended     Medical Summary Current Status: apraxia, word finding deficits, increased issues with complex ADLs  Barriers to Discharge: Medical stability       Continued Need for Acute Rehabilitation Level of Care: The patient requires daily medical management by a  physician with specialized training in physical medicine and rehabilitation for the following reasons: Direction of a multidisciplinary physical rehabilitation program to maximize functional independence : Yes Medical management of patient stability for increased activity during participation in an intensive rehabilitation regime.: Yes Analysis of  laboratory values and/or radiology reports with any subsequent need for medication adjustment and/or medical intervention. : Yes   I attest that I was present, lead the team conference, and concur with the assessment and plan of the team.   Dorien Chihuahua B 12/14/2020, 2:14 PM

## 2020-12-14 NOTE — Progress Notes (Signed)
Patient ID: Jacqueline Bonilla, female   DOB: 06/13/1955, 65 y.o.   MRN: [id number]  Team Conference Report to Patient/Family  Team Conference discussion was reviewed with the patient and caregiver, including goals, any changes in plan of care and target discharge date.  Patient and caregiver express understanding and are in agreement.  The patient has a target discharge date of 12/17/20.  Sw met with patient. Education complete with spouse. Patient happy about d/c on Saturday. Patient would like a sleep chart to monitor how she is sleeping at night.     [name] 12/14/2020, 1:56 PM

## 2020-12-14 NOTE — Progress Notes (Signed)
Occupational Therapy Session Note  Patient Details  Name: Jacqueline Bonilla MRN: [id number] Date of Birth: 01-Oct-1955  Today's Date: 12/14/2020 OT Individual Time: 8299-3716 OT Individual Time Calculation (min): 70 min    Short Term Goals: Week 1:  OT Short Term Goal 1 (Week 1): Pt will complete UB dressing with supervision. OT Short Term Goal 2 (Week 1): Pt will complete LB dressing with min assist. OT Short Term Goal 3 (Week 1): Pt will complete toileting tasks with supervison and no assistive device. OT Short Term Goal 4 (Week 1): Pt will complete two grooming tasks at supervision level in standing.  Skilled Therapeutic Interventions/Progress Updates:    Pt in recliner to start with spouse present.  Worked on LB dressing to start from the EOB with min assist and mod demonstrational cueing secondary to placing the RLE in the left leg opening.  She was able to correct with therapist cueing.  She then donned her socks and shoes with elastic laces with setup.  She was then able to walk over to the sink with min guard and she worked briefly on combing her hair.  Next, she ambulated to the therapy gym without an assistive device and min guard assist.  She was then able to work on simple peg design task for visual processing.  She needed mod instructional/questioning cueing to recognize errors with a simple 8-10 peg design.  She needed max assist to complete harder design with pieces having to be stacked on top of each other.  Had her complete functional mobility back to her room where she initially took the wrong turn, but was able to recognize this only after walking almost all the way down the hall.  She then turned around and found her room where she completed toilet transfer with min guard assist.  Left pt with the NT in the bathroom to finish.    Therapy Documentation Precautions:  Precautions Precautions: Fall, Other (comment) Precaution Comments: apraxic, ataxic Restrictions Weight  Bearing Restrictions: No   Pain: Pain Assessment Pain Scale: Faces Pain Score: 1  Pain Type: Chronic pain Pain Location: Back Pain Orientation: Lower Pain Descriptors / Indicators: Discomfort Pain Onset: With Activity Pain Intervention(s): RN made aware;Medication (See eMAR) ADL: See Care Tool Section for some details of mobility and selfcare   Therapy/Group: Individual Therapy  Haniel Fix OTR/L 12/14/2020, 10:48 AM

## 2020-12-15 DIAGNOSIS — I63512 Cerebral infarction due to unspecified occlusion or stenosis of left middle cerebral artery: Secondary | ICD-10-CM | POA: Diagnosis not present

## 2020-12-15 MED ORDER — LEVETIRACETAM 500 MG PO TABS: 500.0000 mg | ORAL_TABLET | Freq: Two times a day (BID) | ORAL | 0 refills | Status: DC

## 2020-12-15 MED ORDER — MELATONIN 5 MG PO TABS: 5.0000 mg | ORAL_TABLET | Freq: Every evening | ORAL | 0 refills | Status: DC | PRN

## 2020-12-15 MED ORDER — TICAGRELOR 90 MG PO TABS: 90.0000 mg | ORAL_TABLET | Freq: Two times a day (BID) | ORAL | 0 refills | Status: DC

## 2020-12-15 MED ORDER — LOSARTAN POTASSIUM 25 MG PO TABS: 12.5000 mg | ORAL_TABLET | Freq: Every day | ORAL | 0 refills | Status: DC

## 2020-12-15 MED ORDER — PANTOPRAZOLE SODIUM 40 MG PO TBEC: 40.0000 mg | DELAYED_RELEASE_TABLET | Freq: Every day | ORAL | 0 refills | Status: DC

## 2020-12-15 MED ORDER — ATORVASTATIN CALCIUM 40 MG PO TABS: 40.0000 mg | ORAL_TABLET | Freq: Every day | ORAL | 0 refills | Status: DC

## 2020-12-15 MED ORDER — ACETAMINOPHEN 325 MG PO TABS: 650.0000 mg | ORAL_TABLET | ORAL | Status: DC | PRN

## 2020-12-15 MED ORDER — VITAMIN D3 25 MCG PO TABS: 1000.0000 [IU] | ORAL_TABLET | Freq: Every day | ORAL | 0 refills | Status: DC

## 2020-12-15 MED ORDER — LIDOCAINE 5 % EX PTCH: 2.0000 | MEDICATED_PATCH | CUTANEOUS | 0 refills | Status: DC

## 2020-12-15 MED ORDER — THIAMINE HCL 100 MG PO TABS: 100.0000 mg | ORAL_TABLET | Freq: Every day | ORAL | 0 refills | Status: DC

## 2020-12-15 NOTE — Progress Notes (Signed)
Occupational Therapy Session Note  Patient Details  Name: Jacqueline Bonilla MRN: [id number] Date of Birth: Nov 22, 1955  Today's Date: 12/15/2020 OT Individual Time: 5859-2924 OT Individual Time Calculation (min): 42 min    Short Term Goals: Week 1:  OT Short Term Goal 1 (Week 1): Pt will complete UB dressing with supervision. OT Short Term Goal 2 (Week 1): Pt will complete LB dressing with min assist. OT Short Term Goal 3 (Week 1): Pt will complete toileting tasks with supervison and no assistive device. OT Short Term Goal 4 (Week 1): Pt will complete two grooming tasks at supervision level in standing.  Skilled Therapeutic Interventions/Progress Updates:  Skilled OT intervention completed with focus on toilet tranfers, functional ambulation, activity tolerance and sequencing tasks. Pt received seated in recliner with family in room, agreeable to session. Requested to use bathroom, with sit > stand at supervision, then ambulatory transfer without AD to toilet with CGA. Continent episode- void only, with supervision provided for toileting and hand washing at sink. Ambulated to 4W gym, with CGA, and occasional cues needed for attention to objects on R side, but minimal correction needed. Pt participated in [place] task using peg slide board, following directional card for guided set up, to promote sequencing needed for sequencing skills during bathing and dressing tasks. Completed first round of sequencing task seated, per pt request due to fatigue, then 2nd round fully standing with supervision assist without AD, to promote increased activity tolerance needed for self-care tasks. Cues needed to remind pt to use R hand vs L, and min A for re-looking at the set up card directions for guidance as pt often "lost" where she was at, however pt with min difficulty with motor planning with R hand during this task. Increased time to plan her route/strategy. Pt reporting she feels like the task was much  easier this session than previous time. Pt with questions about if motor planning will return, with education provided about brain neuro plasticity after a stroke, with encouragement that the more practice with her R hand, the better the connection will between between the brain/hand. Ambulatory transfer without AD back to room, with CGA. Assisted pt with toileting and hand hygiene with CGA then pt set up in recliner. Pt left seated, with belt alarm on, and all needs in reach at end of session.  Therapy Documentation Precautions:  Precautions Precautions: Fall, Other (comment) Precaution Comments: apraxic, ataxic Restrictions Weight Bearing Restrictions: No  Pain: No c/o pain    Therapy/Group: Individual Therapy  Latise Dilley E Staceyann Knouff 12/15/2020, 7:46 AM

## 2020-12-15 NOTE — Progress Notes (Signed)
PROGRESS NOTE   Subjective/Complaints: No issues overnite , discussed d/c date   ROS-  Pt denies SOB, abd pain, CP, N/V/C/D, and vision changes  Objective:   No results found. No results for input(s): WBC, HGB, HCT, PLT in the last 72 hours.  No results for input(s): NA, K, CL, CO2, GLUCOSE, BUN, CREATININE, CALCIUM in the last 72 hours.   Intake/Output Summary (Last 24 hours) at 12/15/2020 0941 Last data filed at 12/15/2020 0810 Gross per 24 hour  Intake 598 ml  Output --  Net 598 ml         Physical Exam: Vital Signs Blood pressure 124/72, pulse 64, temperature 98.6 F (37 C), resp. rate 18, height 5\' 4"  (1.626 m), weight 49.3 kg, last menstrual period 01/02/2003, SpO2 100 %.   General: No acute distress Mood and affect are appropriate Heart: Regular rate and rhythm no rubs murmurs or extra sounds Lungs: Clear to auscultation, breathing unlabored, no rales or wheezes Abdomen: Positive bowel sounds, soft nontender to palpation, nondistended Extremities: No clubbing, cyanosis, or edema  Neurologic: Cranial nerves II through XII intact, motor strength is 5/5 in bilateral deltoid, bicep, tricep, grip, hip flexor, knee extensors, ankle dorsiflexor and plantar flexor Sensory exam normal sensation to light touch and proprioception in bilateral upper and lower extremities Cerebellar exam normal finger to nose to finger as well as heel to shin in bilateral upper and lower extremities Neg dysdiadochokinesis , in UEs, finger to thumb opposition mildly reduced on RIght side  Musculoskeletal: Full range of motion in all 4 extremities. No joint swelling   Assessment/Plan: 1. Functional deficits which require 3+ hours per day of interdisciplinary therapy in a comprehensive inpatient rehab setting. Physiatrist is providing close team supervision and 24 hour management of active medical problems listed below. Physiatrist and  rehab team continue to assess barriers to discharge/monitor patient progress toward functional and medical goals  Care Tool:  Bathing    Body parts bathed by patient: Right arm, Left arm, Chest, Abdomen, Front perineal area, Buttocks, Right upper leg, Left upper leg, Left lower leg, Right lower leg, Face         Bathing assist Assist Level: Contact Guard/Touching assist     Upper Body Dressing/Undressing Upper body dressing   What is the patient wearing?: Pull over shirt    Upper body assist Assist Level: Minimal Assistance - Patient > 75%    Lower Body Dressing/Undressing Lower body dressing      What is the patient wearing?: Underwear/pull up, Pants     Lower body assist Assist for lower body dressing: Moderate Assistance - Patient 50 - 74%     Toileting Toileting    Toileting assist Assist for toileting: Contact Guard/Touching assist     Transfers Chair/bed transfer  Transfers assist     Chair/bed transfer assist level: Contact Guard/Touching assist     Locomotion Ambulation   Ambulation assist      Assist level: Contact Guard/Touching assist Assistive device: No Device Max distance: 121ft   Walk 10 feet activity   Assist     Assist level: Contact Guard/Touching assist Assistive device: Hand held assist   Walk 50 feet  activity   Assist    Assist level: Minimal Assistance - Patient > 75% Assistive device: Hand held assist    Walk 150 feet activity   Assist    Assist level: Minimal Assistance - Patient > 75% Assistive device: Hand held assist    Walk 10 feet on uneven surface  activity   Assist     Assist level: Minimal Assistance - Patient > 75% Assistive device: Hand held assist   Wheelchair     Assist Is the patient using a wheelchair?: No             Wheelchair 50 feet with 2 turns activity    Assist            Wheelchair 150 feet activity     Assist          Blood pressure 124/72,  pulse 64, temperature 98.6 F (37 C), resp. rate 18, height 5\' 4"  (1.626 m), weight 49.3 kg, last menstrual period 01/02/2003, SpO2 100 %.  Medical Problem List and Plan: 1.  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  Antithrombotics: -DVT/anticoagulation:  Mechanical:  Antiembolism stockings, knee (TED hose) Bilateral lower extremities             -antiplatelet therapy: Aspirin 81 mg daily and Brilinta 90 mg twice daily 3. Pain Management: Tylenol as needed  12/10- pt doesn't like even tylenol- will try Lidoderm patches 2 of them for back/hip pain- 8pm to 8am and monitor 4. Mood: Provide emotional support             -antipsychotic agents: N/A 5. Neuropsych: This patient is capable of making decisions on her own behalf. 6. Skin/Wound Care: Routine skin checks 7. Fluids/Electrolytes/Nutrition: Routine in and outs with follow-up chemistries 8.  Seizure prophylaxis.  EEG negative.  Keppra 500 mg twice daily 9.  Hypertension.  Cozaar 25 mg daily.  Monitor with increased mobility Vitals:   12/14/20 1931 12/15/20 0506  BP: 119/76 124/72  Pulse: 87 64  Resp: 18 18  Temp: 98 F (36.7 C) 98.6 F (37 C)  SpO2: 100% 100%  12/15 controlled   10.  Hyperlipidemia.  Lipitor 11.  History of thrombocytopenia.  Latest platelet count 208,000. 12.  Urinary retention.    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

## 2020-12-15 NOTE — Discharge Summary (Signed)
Physician Discharge Summary  Patient ID: Jacqueline Bonilla MRN: [id number] DOB/AGE: 01-20-1955 65 y.o.  Admit date: 12/08/2020 Discharge date: 12/17/2020  Discharge Diagnoses:  Principal Problem:   Left middle cerebral artery stroke [facility]) Hypertension Seizure prophylaxis Hyperlipidemia History of thrombocytopenia Urinary retention resolved Vascular dementia History of CVA x2  Discharged Condition: Stable  Significant Diagnostic Studies: DG Chest 1 View  Result Date: 12/02/2020 CLINICAL DATA:  Stroke EXAM: CHEST  1 VIEW COMPARISON:  July 20, 2019, October 20, 2020 FINDINGS: Cardiomediastinal silhouette is mildly increased in prominence along the aortic arch, most likely due to a combination of rotation and AP technique. No pleural effusion or pneumothorax. No acute pleuroparenchymal abnormality. Gaseous distension of bowel beneath the LEFT hemidiaphragm. IMPRESSION: 1. Cardiomediastinal silhouette is mildly increased in prominence along the aortic arch, most likely due to a combination of rotation and AP technique. If concern for aortic pathology, recommend dedicated PA and lateral chest radiograph. 2. Otherwise no acute cardiopulmonary abnormality. Electronically Signed   By: Valentino Saxon M.D.   On: 12/02/2020 07:48   CT HEAD WO CONTRAST (5MM)  Result Date: 12/02/2020 CLINICAL DATA:  Follow-up after neurovascular intervention. EXAM: CT HEAD WITHOUT CONTRAST TECHNIQUE: Contiguous axial images were obtained from the base of the skull through the vertex without intravenous contrast. COMPARISON:  12/01/2020 FINDINGS: Brain: Allowing for redistribution, unchanged subarachnoid hyperdensity over both hemispheres, left-greater-than-right. A suspected intraparenchymal component in the left parietal lobe is unchanged. There is periventricular hypoattenuation compatible with chronic microvascular disease. Generalized volume loss. No hydrocephalus. Vascular: No abnormal hyperdensity of the major  intracranial arteries or dural venous sinuses. No intracranial atherosclerosis. Skull: The visualized skull base, calvarium and extracranial soft tissues are normal. Sinuses/Orbits: No fluid levels or advanced mucosal thickening of the visualized paranasal sinuses. No mastoid or middle ear effusion. The orbits are normal. IMPRESSION: 1. Unchanged subarachnoid hyperdensity over both hemispheres, left-greater-than-right, likely a combination contrast extravasation and subarachnoid blood. 2. Unchanged subcortical intraparenchymal blood in the left parietal lobe. Electronically Signed   By: Ulyses Jarred M.D.   On: 12/02/2020 03:36   CT HEAD WO CONTRAST (5MM)  Result Date: 12/01/2020 CLINICAL DATA:  Postop left MCA thrombectomy. Stroke. Follow-up subarachnoid hemorrhage EXAM: CT HEAD WITHOUT CONTRAST TECHNIQUE: Contiguous axial images were obtained from the base of the skull through the vertex without intravenous contrast. COMPARISON:  CT head earlier today 12/01/2020 FINDINGS: Brain: Subarachnoid hemorrhage and possible subarachnoid contrast on the left is unchanged from earlier today. This is in the sylvian fissure and in the left parietal sulci. Small amount of hemorrhage in the interpeduncular cistern unchanged. Minimal subarachnoid hemorrhage right parietal lobe unchanged. Negative for hydrocephalus. Generalized atrophy. Chronic microvascular ischemic change in the white matter. No acute cortical infarct. Vascular: Negative for hyperdense vessel Skull: Negative Sinuses/Orbits: Paranasal sinuses clear.  Negative orbit Other: None IMPRESSION: 1. Acute subarachnoid hemorrhage, stable from earlier today. No hydrocephalus or new hemorrhage. 2. No acute ischemic infarct 3. Atrophy and chronic microvascular ischemic change in Electronically Signed   By: Franchot Gallo M.D.   On: 12/01/2020 18:06   CT HEAD WO CONTRAST  Result Date: 12/01/2020 CLINICAL DATA:  Stroke, follow up. Stroke s/p left M3/MCA thrombectomy  with trace SAH vs contrast extravasation. Evaluate for hemorrhage progression compared to post procedural flat panel CT. EXAM: CT HEAD WITHOUT CONTRAST TECHNIQUE: Contiguous axial images were obtained from the base of the skull through the vertex without intravenous contrast. COMPARISON:  Flat panel head CT 12/01/2020. And head CT December 01, 2020  at 9:59 a.m. FINDINGS: Brain: There has been interval increase of the hyperdensity in the left sylvian fissure, now extending to the cortical sulci in the left parietal lobe, consistent with subarachnoid hemorrhage. Small amount of blood is also seen in the interpeduncular cistern. There is no hydrocephalus. No large acute territorial infarct identified. Chronic scattered cortical infarcts are unchanged from prior CT. Vascular: Hyperdense vessels are related to recent contrast administration. Skull: Normal. Negative for fracture or focal lesion. Sinuses/Orbits: No acute finding. Other: None. IMPRESSION: Interval increase in size of the left sylvian subarachnoid hemorrhage, now extending to the posterior parietal cortical sulci with small amount of blood in the interpeduncular cistern. No hydrocephalus. These results were discussed by telephone at the time of interpretation on 12/01/2020 at 3:09 pm to provider Dr. Erlinda Hong, who verbally acknowledged these results. Electronically Signed   By: Pedro Earls M.D.   On: 12/01/2020 15:09   MR ANGIO HEAD WO CONTRAST  Result Date: 12/02/2020 CLINICAL DATA:  Acute onset of aphasia, history of strokes EXAM: MRI HEAD WITHOUT CONTRAST MRA HEAD WITHOUT CONTRAST TECHNIQUE: Multiplanar, multi-echo pulse sequences of the brain and surrounding structures were acquired without intravenous contrast. Angiographic images of the Circle of Willis were acquired using MRA technique without intravenous contrast. COMPARISON:  Same-day noncontrast head CT, brain MRI 06/25/2018 FINDINGS: MRI HEAD FINDINGS Brain: There are scattered small  areas of diffusion restriction throughout the left MCA distribution with involvement of the insular cortex, anterior frontal lobe and parietal lobe cortex, and corona radiata, consistent with evolving acute infarcts. Additional diffusion restriction with corresponding SWI signal dropout in the left parietal lobe superiorly is likely due to intraparenchymal blood as seen on prior CT. Diffusion restriction in the left temporal lobe is also likely at least in part due to blood products. Sulcal FLAIR hyperintensity and associated SWI signal dropout overlying the left cerebral hemisphere and sylvian fissure as well as the right parietotemporal region is consistent with subarachnoid blood, grossly unchanged compared to the prior CT allowing for difference in modality. There is a background of global parenchymal volume loss and chronic white matter microangiopathy, unchanged. There is no solid mass lesion. There is no midline shift. Vascular: Normal flow voids. Skull and upper cervical spine: Normal marrow signal. Sinuses/Orbits: The paranasal sinuses are clear. The globes and orbits are unremarkable. Other: None. MRA HEAD FINDINGS Anterior circulation: The intracranial ICAs are patent. The left M1 and proximal M2 segments are patent. There has been interval recanalization of the left inferior M2 division which was occluded on the prior CTA. Is no evidence of branch vessel occlusion on the current study. The right MCA is patent The bilateral ACAs are patent. There is no aneurysm. Posterior circulation: The bilateral V4 segments are patent. The basilar artery is patent. The bilateral PCAs are patent. A left posterior communicating artery is identified. The right posterior communicating artery is not definitely seen. Anatomic variants: None. IMPRESSION: 1. Scattered small acute infarcts in the left MCA distribution as above. 2. Subarachnoid hemorrhage overlying the bilateral cerebral hemispheres, not significantly changed  compared to the prior CT allowing for difference in modality. The small focus of intraparenchymal hemorrhage in the left parietal lobe is also unchanged. 3. Interval recanalization of the previously occluded left inferior M2 division. No high-grade stenosis or occlusion on the current study. Electronically Signed   By: Valetta Mole M.D.   On: 12/02/2020 10:52   MR BRAIN WO CONTRAST  Result Date: 12/02/2020 CLINICAL DATA:  Acute onset of  aphasia, history of strokes EXAM: MRI HEAD WITHOUT CONTRAST MRA HEAD WITHOUT CONTRAST TECHNIQUE: Multiplanar, multi-echo pulse sequences of the brain and surrounding structures were acquired without intravenous contrast. Angiographic images of the Circle of Willis were acquired using MRA technique without intravenous contrast. COMPARISON:  Same-day noncontrast head CT, brain MRI 06/25/2018 FINDINGS: MRI HEAD FINDINGS Brain: There are scattered small areas of diffusion restriction throughout the left MCA distribution with involvement of the insular cortex, anterior frontal lobe and parietal lobe cortex, and corona radiata, consistent with evolving acute infarcts. Additional diffusion restriction with corresponding SWI signal dropout in the left parietal lobe superiorly is likely due to intraparenchymal blood as seen on prior CT. Diffusion restriction in the left temporal lobe is also likely at least in part due to blood products. Sulcal FLAIR hyperintensity and associated SWI signal dropout overlying the left cerebral hemisphere and sylvian fissure as well as the right parietotemporal region is consistent with subarachnoid blood, grossly unchanged compared to the prior CT allowing for difference in modality. There is a background of global parenchymal volume loss and chronic white matter microangiopathy, unchanged. There is no solid mass lesion. There is no midline shift. Vascular: Normal flow voids. Skull and upper cervical spine: Normal marrow signal. Sinuses/Orbits: The  paranasal sinuses are clear. The globes and orbits are unremarkable. Other: None. MRA HEAD FINDINGS Anterior circulation: The intracranial ICAs are patent. The left M1 and proximal M2 segments are patent. There has been interval recanalization of the left inferior M2 division which was occluded on the prior CTA. Is no evidence of branch vessel occlusion on the current study. The right MCA is patent The bilateral ACAs are patent. There is no aneurysm. Posterior circulation: The bilateral V4 segments are patent. The basilar artery is patent. The bilateral PCAs are patent. A left posterior communicating artery is identified. The right posterior communicating artery is not definitely seen. Anatomic variants: None. IMPRESSION: 1. Scattered small acute infarcts in the left MCA distribution as above. 2. Subarachnoid hemorrhage overlying the bilateral cerebral hemispheres, not significantly changed compared to the prior CT allowing for difference in modality. The small focus of intraparenchymal hemorrhage in the left parietal lobe is also unchanged. 3. Interval recanalization of the previously occluded left inferior M2 division. No high-grade stenosis or occlusion on the current study. Electronically Signed   By: Valetta Mole M.D.   On: 12/02/2020 10:52   IR CT Head Ltd  Result Date: 12/02/2020 INDICATION: 65 year old female with past medical history significant for anemia, anxiety, depression, two prior strokes in the past 14 months (one presenting with right sided weakness, followed by improvement to mild residual deficit; the second with left sided weakness), HTN, loop recorder implant in October of 2021, osteoporosis and thrombocytopenia. Two presented to emergency by EMS with acute onset of aphasia, right side weakness and incoordination. NIHSS 6 at presentation. Head CT showed no large acute territorial infarct or hemorrhage. CT angiogram of the head and neck showed a left M3/MCA occlusion. She received TNK at  10:20 a.m. on 12/01/2020. Given disabling symptoms, decision was made to proceed with diagnostic cerebral angiogram and mechanical thrombectomy. EXAM: ULTRASOUND-GUIDED VASCULAR ACCESS DIAGNOSTIC CEREBRAL ANGIOGRAM MECHANICAL THROMBECTOMY FLAT PANEL HEAD CT COMPARISON:  CT/CT angiogram of the head and neck December 01, 2020. MEDICATIONS: No antibiotics administered. ANESTHESIA/SEDATION: The procedure was performed under general anesthesia. CONTRAST:  42 mL of Omnipaque 300 milligram/mL FLUOROSCOPY TIME:  Fluoroscopy Time: 9 minutes 24 seconds (476 mGy). COMPLICATIONS: SIR LEVEL B - Normal therapy, includes overnight  admission for observation. TECHNIQUE: Informed written consent was obtained from the patient's husband after a thorough discussion of the procedural risks, benefits and alternatives. All questions were addressed. Maximal Sterile Barrier Technique was utilized including caps, mask, sterile gowns, sterile gloves, sterile drape, hand hygiene and skin antiseptic. A timeout was performed prior to the initiation of the procedure. The right groin was prepped and draped in the usual sterile fashion. Using a micropuncture kit and the modified Seldinger technique, access was gained to the right common femoral artery and an 8 French sheath was placed. Real-time ultrasound guidance was utilized for vascular access including the acquisition of a permanent ultrasound image documenting patency of the accessed vessel. Under fluoroscopy, a Zoom 88 guide catheter was navigated over a 6 [place] Berenstein 2 catheter and a 0.035" Terumo Glidewire into the aortic arch. The catheter was placed into the left common carotid artery and then advanced into the left internal carotid artery. The inner catheter was removed. Frontal and lateral angiograms of the head were obtained. FINDINGS: 1. Normal caliber of the right common femoral artery, adequate for vascular access. 2. 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  PROCEDURE: Under biplane roadmap, a Zoom 35 aspiration catheter was navigated over an Aristotle 14 microguidewire into the cavernous segment of the right ICA. The aspiration catheter was then advanced to the distal left M2/MCA inferior division branch. However, catheter did not track to the level of occlusion. The catheter was subsequently removed. Then, a phenom 21 microcatheter was navigated over an Aristotle 14 micro guidewire into the left M3/MCA superior division branch, past the level of occlusion. Then, a 3 mm solitaire stent retriever was deployed across the occluded segment. The device was allowed to intercalated with the clot for 3 minute. Then, the catheter was gently retracted while continuous aspiration was applied to the guide catheter. Left ICA angiograms showed recanalization of the occluded segment with normal flow in the main branch and slow flow in a side branch (TICI 2C). Flat panel CT of the head was obtained and post processed in a separate workstation with concurrent attending physician supervision. Selected images were sent to PACS. Trace subarachnoid hemorrhage versus contrast extravasation noted in the left sylvian fissure. Follow-up left ICA angiograms with frontal and lateral views of the head showed resolution of slow flow in the side branch now with brisk contrast opacification with slow flow in the main branch likely related to basal spasm. Decision made not to treat vasospasm due to small subarachnoid hemorrhage seen on CT. The catheter was subsequently withdrawn. A right common femoral artery angiogram was obtained with right anterior oblique view. The puncture is at the level of the mid right common femoral artery which has normal caliber, adequate for closure device. The femoral sheath was exchanged over the wire for a Perclose ProGlide which was utilized for access closure. Immediate hemostasis was achieved. IMPRESSION: Successful mechanical thrombectomy performed for treatment of  a left M3/MCA occlusion. Final angiogram showed vasospasm with delayed flow to the branch. Small left sylvian subarachnoid hemorrhage versus contrast extravasation on postprocedural flat panel head CT. PLAN: 1. Transferred to ICU for continued monitoring. 2. Head CT in 3 hours to evaluate for stability of contrast extravasation/subarachnoid hemorrhage. 3. Strict blood pressure control 120-140 mm Hg. Electronically Signed   By: Pedro Earls M.D.   On: 12/02/2020 10:07   IR US Guide Vasc Access Right  Result Date: 12/02/2020 INDICATION: 65 year old female with past medical history significant for anemia, anxiety,  depression, two prior strokes in the past 14 months (one presenting with right sided weakness, followed by improvement to mild residual deficit; the second with left sided weakness), HTN, loop recorder implant in October of 2021, osteoporosis and thrombocytopenia. Two presented to emergency by EMS with acute onset of aphasia, right side weakness and incoordination. NIHSS 6 at presentation. Head CT showed no large acute territorial infarct or hemorrhage. CT angiogram of the head and neck showed a left M3/MCA occlusion. She received TNK at 10:20 a.m. on 12/01/2020. Given disabling symptoms, decision was made to proceed with diagnostic cerebral angiogram and mechanical thrombectomy. EXAM: ULTRASOUND-GUIDED VASCULAR ACCESS DIAGNOSTIC CEREBRAL ANGIOGRAM MECHANICAL THROMBECTOMY FLAT PANEL HEAD CT COMPARISON:  CT/CT angiogram of the head and neck December 01, 2020. MEDICATIONS: No antibiotics administered. ANESTHESIA/SEDATION: The procedure was performed under general anesthesia. CONTRAST:  42 mL of Omnipaque 300 milligram/mL FLUOROSCOPY TIME:  Fluoroscopy Time: 9 minutes 24 seconds (476 mGy). COMPLICATIONS: SIR LEVEL B - Normal therapy, includes overnight admission for observation. TECHNIQUE: Informed written consent was obtained from the patient's husband after a thorough discussion of the  procedural risks, benefits and alternatives. All questions were addressed. Maximal Sterile Barrier Technique was utilized including caps, mask, sterile gowns, sterile gloves, sterile drape, hand hygiene and skin antiseptic. A timeout was performed prior to the initiation of the procedure. The right groin was prepped and draped in the usual sterile fashion. Using a micropuncture kit and the modified Seldinger technique, access was gained to the right common femoral artery and an 8 French sheath was placed. Real-time ultrasound guidance was utilized for vascular access including the acquisition of a permanent ultrasound image documenting patency of the accessed vessel. Under fluoroscopy, a Zoom 88 guide catheter was navigated over a 6 [place] Berenstein 2 catheter and a 0.035" Terumo Glidewire into the aortic arch. The catheter was placed into the left common carotid artery and then advanced into the left internal carotid artery. The inner catheter was removed. Frontal and lateral angiograms of the head were obtained. FINDINGS: 1. Normal caliber of the right common femoral artery, adequate for vascular access. 2. 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 PROCEDURE: Under biplane roadmap, a Zoom 35 aspiration catheter was navigated over an Aristotle 14 microguidewire into the cavernous segment of the right ICA. The aspiration catheter was then advanced to the distal left M2/MCA inferior division branch. However, catheter did not track to the level of occlusion. The catheter was subsequently removed. Then, a phenom 21 microcatheter was navigated over an Aristotle 14 micro guidewire into the left M3/MCA superior division branch, past the level of occlusion. Then, a 3 mm solitaire stent retriever was deployed across the occluded segment. The device was allowed to intercalated with the clot for 3 minute. Then, the catheter was gently retracted while continuous aspiration was applied to the guide catheter. Left  ICA angiograms showed recanalization of the occluded segment with normal flow in the main branch and slow flow in a side branch (TICI 2C). Flat panel CT of the head was obtained and post processed in a separate workstation with concurrent attending physician supervision. Selected images were sent to PACS. Trace subarachnoid hemorrhage versus contrast extravasation noted in the left sylvian fissure. Follow-up left ICA angiograms with frontal and lateral views of the head showed resolution of slow flow in the side branch now with brisk contrast opacification with slow flow in the main branch likely related to basal spasm. Decision made not to treat vasospasm due to small subarachnoid hemorrhage  seen on CT. The catheter was subsequently withdrawn. A right common femoral artery angiogram was obtained with right anterior oblique view. The puncture is at the level of the mid right common femoral artery which has normal caliber, adequate for closure device. The femoral sheath was exchanged over the wire for a Perclose ProGlide which was utilized for access closure. Immediate hemostasis was achieved. IMPRESSION: Successful mechanical thrombectomy performed for treatment of a left M3/MCA occlusion. Final angiogram showed vasospasm with delayed flow to the branch. Small left sylvian subarachnoid hemorrhage versus contrast extravasation on postprocedural flat panel head CT. PLAN: 1. Transferred to ICU for continued monitoring. 2. Head CT in 3 hours to evaluate for stability of contrast extravasation/subarachnoid hemorrhage. 3. Strict blood pressure control 120-140 mm Hg. Electronically Signed   By: Pedro Earls M.D.   On: 12/02/2020 10:07   DG Abd Portable 1V  Result Date: 12/02/2020 CLINICAL DATA:  Feeding tube placement EXAM: PORTABLE ABDOMEN - 1 VIEW COMPARISON:  None. FINDINGS: Feeding tube coiled within the proximal stomach below the left hemidiaphragm. Nonobstructive bowel gas pattern. Mild cardiac  enlargement. Visualized lungs clear. IMPRESSION: Feeding tube coiled within the proximal stomach. Electronically Signed   By: Jerilynn Mages.  Shick M.D.   On: 12/02/2020 15:18   EEG adult  Result Date: 12/02/2020 Lora Havens, MD     12/02/2020  1:09 PM Patient Name: Jacqueline Bonilla MRN: [id number] Epilepsy Attending: Lora Havens Referring Physician/Provider: Dr. Rosalin Hawking Date: 12/02/2020 Duration: 41.54 mins Patient history: 65 year old female with facial twitching and left gaze preference. EEG evaluate for seizure. Level of alertness: Awake, asleep AEDs during EEG study: LEV, ativan Technical aspects: This EEG study was done with scalp electrodes positioned according to the 10-20 International system of electrode placement. Electrical activity was acquired at a sampling rate of _0  and reviewed with a high frequency filter of _1  and a low frequency filter of _2 . EEG data were recorded continuously and digitally stored. Description: The posterior dominant rhythm consists of 9-10 Hz activity of moderate voltage (25-35 uV) seen predominantly in posterior head regions, asymmetric ( L<R) and reactive to eye opening and eye closing. Sleep was characterized by vertex waves, sleep spindles (12 to 14 Hz), maximal frontocentral region. EEG showed continuous 3 to 6 Hz theta-delta slowing in left frontotemporal region.  Frequent spikes were also noted in left frontotemporal region.  Hyperventilation and photic stimulation were not performed.   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  No seizures  were seen throughout the recording. Lora Havens   ECHOCARDIOGRAM COMPLETE  Result Date: 12/03/2020    ECHOCARDIOGRAM REPORT   Patient Name:   Jacqueline Bonilla Murrell Date of Exam:  12/03/2020 Medical Rec #:  [id number]          Height:       64.0 in Accession #:    [id number]         Weight:       125.2 lb Date of Birth:  [birth year]          BSA:          1.603 m Patient Age:    33 years           BP:           106/66 mmHg Patient Gender: F  HR:           90 bpm. Exam Location:  Inpatient Procedure: 2D Echo, Color Doppler and Cardiac Doppler Indications:    Stroke  History:        Patient has prior history of Echocardiogram examinations. Risk                 Factors:Hypertension.  Sonographer:    Jyl Heinz Referring Phys: [id number] [place] XU IMPRESSIONS  1. Left ventricular ejection fraction, by estimation, is 55 to 60%. The left ventricle has normal function. The left ventricle has no regional wall motion abnormalities. Left ventricular diastolic parameters were normal.  2. Right ventricular systolic function is normal. The right ventricular size is normal.  3. The mitral valve is normal in structure. No evidence of mitral valve regurgitation.  4. The aortic valve is normal in structure. Aortic valve regurgitation is not visualized. FINDINGS  Left Ventricle: Left ventricular ejection fraction, by estimation, is 55 to 60%. The left ventricle has normal function. The left ventricle has no regional wall motion abnormalities. The left ventricular internal cavity size was normal in size. There is  no left ventricular hypertrophy. Left ventricular diastolic parameters were normal. Right Ventricle: The right ventricular size is normal. Right vetricular wall thickness was not well visualized. Right ventricular systolic function is normal. Left Atrium: Left atrial size was normal in size. Right Atrium: Right atrial size was normal in size. Pericardium: There is no evidence of pericardial effusion. Mitral Valve: The mitral valve is normal in structure. No evidence of mitral valve regurgitation. Tricuspid Valve: The tricuspid valve is grossly normal. Tricuspid valve regurgitation is  mild. Aortic Valve: The aortic valve is normal in structure. Aortic valve regurgitation is not visualized. Aortic valve peak gradient measures 5.9 mmHg. Pulmonic Valve: The pulmonic valve was grossly normal. Pulmonic valve regurgitation is not visualized. Aorta: The aortic root and ascending aorta are structurally normal, with no evidence of dilitation. IAS/Shunts: The interatrial septum was not well visualized.  LEFT VENTRICLE PLAX 2D LVIDd:         4.30 cm     Diastology LVIDs:         2.90 cm     LV e' medial:    6.85 cm/s LV PW:         0.80 cm     LV E/e' medial:  11.0 LV IVS:        0.90 cm     LV e' lateral:   10.00 cm/s LVOT diam:     2.00 cm     LV E/e' lateral: 7.5 LV SV:         65 LV SV Index:   40 LVOT Area:     3.14 cm  LV Volumes (MOD) LV vol d, MOD A2C: 85.1 ml LV vol d, MOD A4C: 82.4 ml LV vol s, MOD A2C: 36.2 ml LV vol s, MOD A4C: 34.8 ml LV SV MOD A2C:     48.9 ml LV SV MOD A4C:     82.4 ml LV SV MOD BP:      49.9 ml RIGHT VENTRICLE             IVC RV Basal diam:  3.20 cm     IVC diam: 1.80 cm RV Mid diam:    1.80 cm RV S prime:     10.00 cm/s TAPSE (M-mode): 2.2 cm LEFT ATRIUM             Index  RIGHT ATRIUM           Index LA diam:        2.00 cm 1.25 cm/m   RA Area:     12.20 cm LA Vol (A2C):   15.3 ml 9.54 ml/m   RA Volume:   28.90 ml  18.02 ml/m LA Vol (A4C):   22.0 ml 13.72 ml/m LA Biplane Vol: 20.3 ml 12.66 ml/m  AORTIC VALVE AV Area (Vmax): 2.78 cm AV Vmax:        121.00 cm/s AV Peak Grad:   5.9 mmHg LVOT Vmax:      107.00 cm/s LVOT Vmean:     71.700 cm/s LVOT VTI:       0.206 m  AORTA Ao Root diam: 2.90 cm MITRAL VALVE MV Area (PHT): 5.88 cm    SHUNTS MV Decel Time: 129 msec    Systemic VTI:  0.21 m MV E velocity: 75.10 cm/s  Systemic Diam: 2.00 cm MV A velocity: 77.80 cm/s MV E/A ratio:  0.97 Mertie Moores MD Electronically signed by Mertie Moores MD Signature Date/Time: 12/03/2020/2:41:39 PM    Final    ECHO TEE  Result Date: 12/08/2020    TRANSESOPHOGEAL ECHO REPORT    Patient Name:   Jacqueline Bonilla Date of Exam: 12/08/2020 Medical Rec #:  [id number]          Height:       64.0 in Accession #:    [id number]         Weight:       119.9 lb Date of Birth:  01/22/1955          BSA:          1.574 m Patient Age:    7 years           BP:           138/80 mmHg Patient Gender: F                  HR:           114 bpm. Exam Location:  Inpatient Procedure: Transesophageal Echo Indications:    Stroke  History:        Patient has prior history of Echocardiogram examinations.                 Stroke.  Sonographer:    Philipp Deputy RDCS Referring Phys: [id number] [place]: The transesophogeal probe was passed without difficulty through the esophogus of the patient. Sedation performed by different physician. The patient's vital signs; including heart rate, blood pressure, and oxygen saturation; remained stable throughout the procedure. The patient developed no complications during the procedure. IMPRESSIONS  1. Left ventricular ejection fraction, by estimation, is 60 to 65%. The left ventricle has normal function. The left ventricle has no regional wall motion abnormalities.  2. Right ventricular systolic function is normal. The right ventricular size is normal.  3. No left atrial/left atrial appendage thrombus was detected.  4. The mitral valve is abnormal. Mild mitral valve regurgitation. No evidence of mitral stenosis.  5. The aortic valve is tricuspid. Aortic valve regurgitation is not visualized. No aortic stenosis is present.  6. The inferior vena cava is normal in size with greater than 50% respiratory variability, suggesting right atrial pressure of 3 mmHg.  7. Agitated saline contrast bubble study was negative, with no evidence of any interatrial shunt. Conclusion(s)/Recommendation(s): Normal biventricular function without evidence of hemodynamically significant valvular heart disease. FINDINGS  Left Ventricle: Left ventricular ejection fraction, by estimation,  is 60 to 65%. The left ventricle has normal function. The left ventricle has no regional wall motion abnormalities. The left ventricular internal cavity size was normal in size. There is  no left ventricular hypertrophy. Right Ventricle: The right ventricular size is normal. No increase in right ventricular wall thickness. Right ventricular systolic function is normal. Left Atrium: Left atrial size was normal in size. No left atrial/left atrial appendage thrombus was detected. Right Atrium: Right atrial size was normal in size. Pericardium: There is no evidence of pericardial effusion. Mitral Valve: The mitral valve is abnormal. There is mild thickening of the mitral valve leaflet(s). Mild mitral valve regurgitation. No evidence of mitral valve stenosis. Tricuspid Valve: The tricuspid valve is normal in structure. Tricuspid valve regurgitation is not demonstrated. No evidence of tricuspid stenosis. Aortic Valve: The aortic valve is tricuspid. Aortic valve regurgitation is not visualized. No aortic stenosis is present. Pulmonic Valve: The pulmonic valve was normal in structure. Pulmonic valve regurgitation is not visualized. No evidence of pulmonic stenosis. Aorta: The aortic root is normal in size and structure. Venous: The inferior vena cava is normal in size with greater than 50% respiratory variability, suggesting right atrial pressure of 3 mmHg. IAS/Shunts: No atrial level shunt detected by color flow Doppler. Agitated saline contrast bubble study was negative, with no evidence of any interatrial shunt. There is no evidence of a patent foramen ovale. Jenkins Rouge MD Electronically signed by Jenkins Rouge MD Signature Date/Time: 12/08/2020/10:18:15 AM    Final    CUP PACEART REMOTE DEVICE CHECK  Result Date: 11/22/2020 ILR summary report received. Battery status OK. Normal device function. No new symptom, tachy, brady, or pause episodes. No new AF episodes. Monthly summary reports and ROV/PRN LR  IR  PERCUTANEOUS ART THROMBECTOMY/INFUSION INTRACRANIAL INC DIAG ANGIO  Result Date: 12/02/2020 INDICATION: 65 year old female with past medical history significant for anemia, anxiety, depression, two prior strokes in the past 14 months (one presenting with right sided weakness, followed by improvement to mild residual deficit; the second with left sided weakness), HTN, loop recorder implant in October of 2021, osteoporosis and thrombocytopenia. Two presented to emergency by EMS with acute onset of aphasia, right side weakness and incoordination. NIHSS 6 at presentation. Head CT showed no large acute territorial infarct or hemorrhage. CT angiogram of the head and neck showed a left M3/MCA occlusion. She received TNK at 10:20 a.m. on 12/01/2020. Given disabling symptoms, decision was made to proceed with diagnostic cerebral angiogram and mechanical thrombectomy. EXAM: ULTRASOUND-GUIDED VASCULAR ACCESS DIAGNOSTIC CEREBRAL ANGIOGRAM MECHANICAL THROMBECTOMY FLAT PANEL HEAD CT COMPARISON:  CT/CT angiogram of the head and neck December 01, 2020. MEDICATIONS: No antibiotics administered. ANESTHESIA/SEDATION: The procedure was performed under general anesthesia. CONTRAST:  42 mL of Omnipaque 300 milligram/mL FLUOROSCOPY TIME:  Fluoroscopy Time: 9 minutes 24 seconds (476 mGy). COMPLICATIONS: SIR LEVEL B - Normal therapy, includes overnight admission for observation. TECHNIQUE: Informed written consent was obtained from the patient's husband after a thorough discussion of the procedural risks, benefits and alternatives. All questions were addressed. Maximal Sterile Barrier Technique was utilized including caps, mask, sterile gowns, sterile gloves, sterile drape, hand hygiene and skin antiseptic. A timeout was performed prior to the initiation of the procedure. The right groin was prepped and draped in the usual sterile fashion. Using a micropuncture kit and the modified Seldinger technique, access was gained to the right common  femoral artery and an 8 French sheath was placed. Real-time ultrasound guidance  was utilized for vascular access including the acquisition of a permanent ultrasound image documenting patency of the accessed vessel. Under fluoroscopy, a Zoom 88 guide catheter was navigated over a 6 [place] Berenstein 2 catheter and a 0.035" Terumo Glidewire into the aortic arch. The catheter was placed into the left common carotid artery and then advanced into the left internal carotid artery. The inner catheter was removed. Frontal and lateral angiograms of the head were obtained. FINDINGS: 1. Normal caliber of the right common femoral artery, adequate for vascular access. 2. 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 PROCEDURE: Under biplane roadmap, a Zoom 35 aspiration catheter was navigated over an Aristotle 14 microguidewire into the cavernous segment of the right ICA. The aspiration catheter was then advanced to the distal left M2/MCA inferior division branch. However, catheter did not track to the level of occlusion. The catheter was subsequently removed. Then, a phenom 21 microcatheter was navigated over an Aristotle 14 micro guidewire into the left M3/MCA superior division branch, past the level of occlusion. Then, a 3 mm solitaire stent retriever was deployed across the occluded segment. The device was allowed to intercalated with the clot for 3 minute. Then, the catheter was gently retracted while continuous aspiration was applied to the guide catheter. Left ICA angiograms showed recanalization of the occluded segment with normal flow in the main branch and slow flow in a side branch (TICI 2C). Flat panel CT of the head was obtained and post processed in a separate workstation with concurrent attending physician supervision. Selected images were sent to PACS. Trace subarachnoid hemorrhage versus contrast extravasation noted in the left sylvian fissure. Follow-up left ICA angiograms with frontal and lateral views  of the head showed resolution of slow flow in the side branch now with brisk contrast opacification with slow flow in the main branch likely related to basal spasm. Decision made not to treat vasospasm due to small subarachnoid hemorrhage seen on CT. The catheter was subsequently withdrawn. A right common femoral artery angiogram was obtained with right anterior oblique view. The puncture is at the level of the mid right common femoral artery which has normal caliber, adequate for closure device. The femoral sheath was exchanged over the wire for a Perclose ProGlide which was utilized for access closure. Immediate hemostasis was achieved. IMPRESSION: Successful mechanical thrombectomy performed for treatment of a left M3/MCA occlusion. Final angiogram showed vasospasm with delayed flow to the branch. Small left sylvian subarachnoid hemorrhage versus contrast extravasation on postprocedural flat panel head CT. PLAN: 1. Transferred to ICU for continued monitoring. 2. Head CT in 3 hours to evaluate for stability of contrast extravasation/subarachnoid hemorrhage. 3. Strict blood pressure control 120-140 mm Hg. Electronically Signed   By: Pedro Earls M.D.   On: 12/02/2020 10:07   CT HEAD CODE STROKE WO CONTRAST  Result Date: 12/01/2020 CLINICAL DATA:  Code stroke.  Aphasia, right-sided weakness EXAM: CT HEAD WITHOUT CONTRAST TECHNIQUE: Contiguous axial images were obtained from the base of the skull through the vertex without intravenous contrast. COMPARISON:  06/24/2020 FINDINGS: Brain: There is no acute intracranial hemorrhage, mass effect, or edema. No new loss of gray-white differentiation. Prominence of the ventricles and sulci reflects similar parenchymal volume loss. Scattered chronic cortical infarcts. Small chronic infarct of the body of the left caudate. Small chronic right greater than left cerebellar infarcts. No extra-axial collection. Vascular: No hyperdense vessel. There is  intracranial atherosclerotic calcification at the skull base. Skull: Unremarkable. Sinuses/Orbits: No acute abnormality. Other: Mastoid  air cells are clear. ASPECTS (Temple Stroke Program Early CT Score) - Ganglionic level infarction (caudate, lentiform nuclei, internal capsule, insula, M1-M3 cortex): 7 - Supraganglionic infarction (M4-M6 cortex): 3 Total score (0-10 with 10 being normal): 10 IMPRESSION: There is no acute intracranial hemorrhage or evidence of acute infarction. ASPECT score is 10. These results were communicated to Dr. Cheral Marker at 10:11 am on 12/01/2020 by text page via the [facility] messaging system. Electronically Signed   By: Macy Mis M.D.   On: 12/01/2020 10:12   VAS [name] LOWER EXTREMITY VENOUS (DVT)  Result Date: 12/03/2020  Lower Venous DVT Study Patient Name:  Jacqueline Bonilla Crossett  Date of Exam:   12/03/2020 Medical Rec #: [id number]           Accession #:    [id number] Date of Birth: 15-Mar-1955           Patient Gender: F Patient Age:   75 years Exam Location:  [facility] Procedure:      VAS [name] LOWER EXTREMITY VENOUS (DVT) Referring Phys: Cornelius Moras XU --------------------------------------------------------------------------------  Indications: Stroke.  Comparison Study: 06-27-2020 Prior lower extremity venous was negative for DVT                   bilaterally. Performing Technologist: Darlin Coco RDMS, RVT  Examination Guidelines: A complete evaluation includes B-mode imaging, spectral Doppler, color Doppler, and power Doppler as needed of all accessible portions of each vessel. Bilateral testing is considered an integral part of a complete examination. Limited examinations for reoccurring indications may be performed as noted. The reflux portion of the exam is performed with the patient in reverse Trendelenburg.  +---------+---------------+---------+-----------+----------+--------------+  RIGHT     Compressibility Phasicity Spontaneity Properties Thrombus Aging   +---------+---------------+---------+-----------+----------+--------------+  CFV       Full            Yes       Yes                                    +---------+---------------+---------+-----------+----------+--------------+  SFJ       Full                                                             +---------+---------------+---------+-----------+----------+--------------+  FV Prox   Full                                                             +---------+---------------+---------+-----------+----------+--------------+  FV Mid    Full                                                             +---------+---------------+---------+-----------+----------+--------------+  FV Distal Full                                                             +---------+---------------+---------+-----------+----------+--------------+  PFV       Full                                                             +---------+---------------+---------+-----------+----------+--------------+  POP       Full            Yes       Yes                                    +---------+---------------+---------+-----------+----------+--------------+  PTV       Full                                                             +---------+---------------+---------+-----------+----------+--------------+  PERO      Full                                                             +---------+---------------+---------+-----------+----------+--------------+   +---------+---------------+---------+-----------+----------+--------------+  LEFT      Compressibility Phasicity Spontaneity Properties Thrombus Aging  +---------+---------------+---------+-----------+----------+--------------+  CFV       Full            Yes       Yes                                    +---------+---------------+---------+-----------+----------+--------------+  SFJ       Full                                                              +---------+---------------+---------+-----------+----------+--------------+  FV Prox   Full                                                             +---------+---------------+---------+-----------+----------+--------------+  FV Mid    Full                                                             +---------+---------------+---------+-----------+----------+--------------+  FV Distal Full                                                             +---------+---------------+---------+-----------+----------+--------------+  PFV       Full                                                             +---------+---------------+---------+-----------+----------+--------------+  POP       Full            Yes       Yes                                    +---------+---------------+---------+-----------+----------+--------------+  PTV       Full                                                             +---------+---------------+---------+-----------+----------+--------------+  PERO      Full                                                             +---------+---------------+---------+-----------+----------+--------------+     Summary: RIGHT: - There is no evidence of deep vein thrombosis in the lower extremity.  - No cystic structure found in the popliteal fossa.  LEFT: - There is no evidence of deep vein thrombosis in the lower extremity.  - No cystic structure found in the popliteal fossa.  *See table(s) above for measurements and observations. Electronically signed by Deitra Mayo MD on 12/03/2020 at 1:57:28 PM.    Final    CT ANGIO HEAD NECK W WO CM W PERF (CODE STROKE)  Result Date: 12/01/2020 CLINICAL DATA:  Neuro deficit, acute, stroke suspected EXAM: CT ANGIOGRAPHY HEAD AND NECK CT PERFUSION BRAIN TECHNIQUE: Multidetector CT imaging of the head and neck was performed using the standard protocol during bolus administration of intravenous contrast. Multiplanar CT image reconstructions and MIPs were  obtained to evaluate the vascular anatomy. Carotid stenosis measurements (when applicable) are obtained utilizing NASCET criteria, using the distal internal carotid diameter as the denominator. Multiphase CT imaging of the brain was performed following IV bolus contrast injection. Subsequent parametric perfusion maps were calculated using RAPID software. CONTRAST:  115m OMNIPAQUE IOHEXOL 350 MG/ML SOLN COMPARISON:  June 2021 FINDINGS: CTA NECK Aortic arch: Minimal calcified plaque along the arch. Great vessel origins are patent. Right carotid system: Patent. Trace plaque at the ICA origin. No stenosis. Left carotid system: Patent.  No stenosis. Vertebral arteries: Patent and codominant.  No stenosis. Skeleton: Mild cervical spine degenerative changes. Other neck: Unremarkable. Upper chest: No apical lung mass.  Patulous esophagus. Review of the MIP images confirms the above findings CTA HEAD Mild motion artifact is present. Anterior circulation: Intracranial internal carotid arteries are patent. Mild calcified plaque is present without significant stenosis. Left M1 MCA is patent. There is occlusion of distal left M2 MCA branch at the posterior aspect of the sylvian fissure (for example see series 12, image 28). Posterior circulation: Intracranial vertebral arteries are  patent. Basilar artery is patent. Major cerebellar artery origins are patent. Posterior cerebral arteries are patent. Venous sinuses: Patent as allowed by contrast bolus timing. 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 Perfusion imaging demonstrates no core infarction and 16 mL of penumbra within the posterior left MCA territory. No hemodynamically significant  stenosis in the neck. These results were called by telephone at the time of interpretation on 12/01/2020 at 10:27 am to provider ERIC LINRena[facility] Surgery Center LLCwho verbally acknowledged these results. Electronically Signed   By: PraMacy MisD.   On: 12/01/2020 10:34   Home sleep test  Result Date: 12/12/2020 DohLarey SeatD     12/12/2020 12:20 PM     [facility] by [facility] PAT[facility]  STUDY DATE:  11-30-2020 DOB:  5-209/20/1957RDERING CLINICIAN: CarLarey SeatD REFERRING CLINICIAN: Dr AntSarina IllD and Dr. SagHorald PollenD  CLINICAL INFORMATION/HISTORY: DebJAIMI BELLE a 65 57o. Caucasian female and was seen in a sleep consultation on 11/17/2020 from Dr. AheJaynee Eagleson her request for a sleep consultation. . Chief concern according to patient :  " looking for reasons for my strokes, last in June, with 3 new lesions on MRI "  husband ChuOrononternal referral from Dr. AheJaynee Eaglesr morning HA and witnessed sleep apnea. Pt no longer snoring or having sleep interruptions. Sleeps about 6 hrs straights. Pt is using kinesiology tape to keep her mouth closed.   DebEzekiel Slocumbs a past medical history of Anemia, Anxiety, CVA- repeated cryptogenic strokes, 2022, Dysmenorrhea, Epistaxis, Endometriosis, Fibroid, Hypertension, Iron deficiency anemia, Microhematuria, Osteoporosis, Tachycardia, and Thrombocytopenia ([place] was negative for atrial fibrillation, loop recorder was implanted 12 months ago.  Sleep relevant medical history: Nocturia- 2-5 times - but not since she tapes her jaw- insomnia. Uses a taped jaw guard for this HST for half of the night and the other half was recorded without .  Epworth sleepiness score: 3/24.  BMI: 20.3 kg/m  Neck Circumference: 13"  FINDINGS:  Sleep Summary:  Total Recording Time (hours, min): The total recording time for this home sleep test was 10 hours and 4 minutes of which total sleep time was estimated to be 8 hours 51 minutes.     Percent REM (%): 19.1% of sleep time were REM sleep.                                     Respiratory Indices:  Calculated pAHI (per hour): The calculated AHI was only 5.3/h but during REM sleep rose to 11.8/h in non-REM sleep the baseline AHI was only 4/h. Positional apnea data show a supine AHI of 5.3 and supine RDI of 22.9/h.  When while sleeping on the right the patient had no apnea at all and while sleeping on the left an AHI of 9.3/h was noted.  So overall nonsupine sleep does benefit the patient. Snoring was moderate in severity of his 40 dB mean volume.  Only 1% of total sleep time was accompanied by snoring.  REM pAHI:                                               NREM pAHI:                                                                        Oxygen Saturation Statistics:  O2 Saturation Range (%): Range between a nadir of 89% of the maximum 100% with a mean saturation of 97%.  The patient's oxygen saturation always states at or above 89%.                                   O2 Saturation (minutes) <89%:   0 minutes      Pulse Rate Statistics:   Pulse Range:   Pulse ranged between 53 and 103 bpm with a mean heart rate of 68 bpm.  Please note this is home sleep test is not able to identify cardiac arrhythmias only heart rate.            IMPRESSION:  This HST confirms the presence of of moderate snoring but only mild very mild sleep apnea. I reviewed the sleep data before and after the patient indicated that she used her mouthguard or facemask.  It appears that this change took place around 2 AM, the first half of the night was with mouthguard for the second half without.  I see no major improvement and the patient slept without the mouthguard REM sleep was noted before and after, there were no snoring or positional data recorded after 11:00.  I would base any further recommendations on the patient's perception if the taped mouthguard prevents her from snoring or not.  This very mild  degree of apnea would not require CPAP intervention as long as the patient could stay in the nonsupine sleep position.  RECOMMENDATION:  I like to confirm that the patient changed the mouth guard at 2 AM and took it off. I would base any further recommendations on the patient's perception if the taped mouthguard prevents her from snoring or not.  This very mild degree of apnea would not require CPAP intervention as long as the patient could stay in the nonsupine sleep position.  INTERPRETING PHYSICIAN:  Larey Seat, MD Medical Director of [facility] at [facility].              Labs:  Basic Metabolic Panel: No results for input(s): NA, K, CL, CO2, GLUCOSE, BUN, CREATININE, CALCIUM, MG, PHOS in the last 168 hours.   CBC: No results for input(s): WBC, NEUTROABS, HGB, HCT, MCV, PLT in the last 168 hours.   CBG: No results for input(s): GLUCAP in the last 168 hours.  Family history.  Father with non-Hodgkin's lymphoma grandfather with myocardial infarction mother with dementia.  Denies any colon cancer esophageal cancer or rectal cancer  Brief HPI:   Jacqueline Bonilla is a 66 y.o. right-handed female with history of anxiety/depression chronic anemia 2 prior strokes in the past 14 months and did receive inpatient rehab services 06/28/2020 -  07/28/2020 1 presented with right side weakness followed by improvement of mild residual deficits in the sacral left side weakness maintained on aspirin with loop recorder implant October 2021 hypertension osteoporosis thrombocytopenia and vascular dementia.  Per chart review lives with spouse.  Modified independent prior to admission not driving.  Presented 12/01/2020 with acute onset of aphasia and right-sided incoordination relative to her baseline deficit.  Initial cranial CT scan negative for acute changes.  CT angiogram head and neck occlusion of distal left M2 MCA branch at the posterior aspect of the sylvian fissure.  No hemodynamically significant stenosis of  the neck.  Patient underwent successful mechanical thrombectomy per interventional radiology and follow-up cranial CT scan showed subarachnoid hyperdensity over the both hemispheres left greater than right likely combination contrast extravasation and subarachnoid blood.  EEG 12/02/2020 showed no seizure however there was some evidence of cortical dysfunction and epileptogenicity arising from the left frontal parietal region and loaded with Keppra.  MRI/MRA completed 12/02/2020 scattered small acute infarcts in the left MCA distribution with also noted subarachnoid hemorrhage overlying the bilateral cerebral hemispheres not significantly changed from prior cranial CT scan.  Neurology follow-up maintained on low-dose aspirin therapy and Brilinta was later initiated.  Echocardiogram with ejection fraction of 55 to 60% no wall motion abnormalities.  TEE showed ejection fraction 60% without thrombus or PFO.  No source of embolus.  Bouts of urinary retention placed on low-dose Urecholine as well as Flomax.  Therapy evaluations completed due to patient's aphasia and right-sided incoordination was admitted for a comprehensive rehab program.   Hospital Course: Jacqueline Bonilla was admitted to rehab 12/08/2020 for inpatient therapies to consist of PT, ST and OT at least three hours five days a week. Past admission physiatrist, therapy team and rehab RN have worked together to provide customized collaborative inpatient rehab.  Pertaining to patient's left distal M2-M3 occlusion status post TN K revascularization complicated by small SAH with TN K reversal etiology recurrent cryptogenic shock small acute infarct left MCA distribution as well as CVA x2 in the past.  Patient remained on aspirin and Brilinta as advised and would follow-up neurology services.  Pain managed with use of Lidoderm patch as directed.  Blood pressure controlled on Cozaar and would need outpatient follow-up.  Keppra for seizure prophylaxis EEG  negative.  Lipitor for hyperlipidemia.  History of thrombocytopenia latest platelet count 230,000.  Bouts of urinary retention initially maintained on Urecholine and Flomax voiding without difficulty.   Blood pressures were monitored on TID basis and controlled     Rehab course: During patient's stay in rehab weekly team conferences were held to monitor patient's progress, set goals and discuss barriers to discharge. At admission, patient required minimal assist 50 feet rolling walker minimal assist sit to stand  Physical exam.  Blood pressure 111/71 pulse 89 temperature 98.2 respirations 18 oxygen saturations 100% room air Constitutional.  No acute distress HEENT Head.  Normocephalic and atraumatic Eyes.  Pupils round and reactive to light no discharge without nystagmus Neck.  Supple nontender no JVD without thyromegaly Cardiac regular rate rhythm without extra sounds or murmur heard Abdomen.  Soft nontender positive bowel sounds without rebound Respiratory effort normal no respiratory distress without wheeze Musculoskeletal.  Normal range of motion Comments.  Left upper extremity 5/5 Left lower extremity 5/5 Right upper extremity 4+/5 in same muscles Right lower extremity 4+/5 same muscles Skin.  Warm and dry Neurologic.  Alert follows commands makes eye contact with examiner.  Provides name and age  with some mild delay in processing.  Patient did have some word finding deficits can speak at a sentence level usually however due to some impaired memory and aphasia was having difficulty making her higher level issues known.  He/She  has had improvement in activity tolerance, balance, postural control as well as ability to compensate for deficits. He/She has had improvement in functional use RUE/LUE  and RLE/LLE as well as improvement in awareness.  Ambulatory to therapy gym with contact-guard no assistive device.  Demonstrated improved balance and coordination.  Patient bathroom minimal  assist clothing management needing assist for hygiene.  Work with lower body dressing edge of bed minimal assist and moderate assist demonstration of cueing secondary to placing the right lower extremity in the left leg opening.  She was able to correct with therapist cueing.  Needed max assist to complete harder design peg puzzles.  SLP educated on word finding strategies using semantic feature analysis in which patient executed with supervision assist.  Full family teaching completed plan discharged home       Disposition: Discharge to home   Diet: Regular  Special Instructions: No driving smoking or alcohol  Follow-up with neurology services in regards to timeline of using aspirin and Brilinta  Medications at discharge 1.  Tylenol as needed 2.  Aspirin 81 mg p.o. daily 3.  Lipitor 40 mg p.o. daily 4.  Vitamin D 1000 units p.o. daily 5.  Keppra 500 mg p.o. twice daily 6.  Lidoderm patch 2 patches change as directed 7.  Cozaar 12.5 mg p.o. daily 8.  Multivitamin daily 9.  Protonix 40 mg p.o. daily 10.  Brilinta 90 mg p.o. twice daily 11.  Thiamine 100 mg p.o. daily 12.  Melatonin 5 mg nightly as needed  30-35 minutes were spent completing discharge summary and discharge planning  Discharge Instructions     Ambulatory referral to Neurology   Complete by: As directed    An appointment is requested in approximately: 4 weeks left MCA infarction   Ambulatory referral to Physical Medicine Rehab   Complete by: As directed    Moderate complexity follow-up 1 to 2 weeks left MCA infarction        Follow-up Information     Kirsteins, Luanna Salk, MD Follow up.   Specialty: Physical Medicine and Rehabilitation Why: Office to call for appointment Contact information: [name] [address] [phone]                 Signed: Cathlyn Parsons 12/16/2020, 5:12 AM

## 2020-12-15 NOTE — Progress Notes (Signed)
Occupational Therapy Session Note  Patient Details  Name: Jacqueline Bonilla MRN: [id number] Date of Birth: [birth year]  Today's Date: 12/15/2020 OT Individual Time: 8250-5397 OT Individual Time Calculation (min): 59 min    Short Term Goals: Week 1:  OT Short Term Goal 1 (Week 1): Pt will complete UB dressing with supervision. OT Short Term Goal 2 (Week 1): Pt will complete LB dressing with min assist. OT Short Term Goal 3 (Week 1): Pt will complete toileting tasks with supervison and no assistive device. OT Short Term Goal 4 (Week 1): Pt will complete two grooming tasks at supervision level in standing.  Skilled Therapeutic Interventions/Progress Updates:    Pt in recliner to start, agreeable to completion of ADL session.  She was able to gather her clothing for dressing post shower with supervision and no device with transfer to the toilet at the same level.  She was able to undress sit to stand at supervision and then complete toileting tasks at the same level.  Once complete, she transferred over to the tub bench where she completed shower sit to stand with supervision and min instructional cueing for sequencing and thoroughness.  Mod instructional cueing for drying off completely as well before transferring out to the EOB to dress.  She was able to donn a pullover shirt with min assist secondary to getting her head on the wrong side and having it through the tank top sleeve instead.  She was able to donn all LB clothing with supervision and mod instructional cueing to orient the underpants before putting them on.  Once dressing was done, she was able to stand at the sink and complete combing and drying her hair with the blow dryer at supervision level.  She ambulated down to the dayroom at min guard assist and no device where she sat on a mat and completed modified peg test.  She was able to complete 12 large pegs, placing them in a large peg board with the right hand at 82 seconds compared to  68 seconds on the right.  She still exhibits some decreased speed and FM coordination in the right hand, but likely from previous CVAs.  Finished session with ambulation back to the room at the same min guard assist level, with mod questioning cueing to determine which direction she needed to go using the signs in the hall.  She was left sitting in the recliner with her call button and phone in reach and with her spouse in the room.      Therapy Documentation Precautions:  Precautions Precautions: Fall, Other (comment) Precaution Comments: apraxic, ataxic Restrictions Weight Bearing Restrictions: No   Pain: Pain Assessment Pain Scale: Faces Pain Score: 0-No pain ADL: See Care Tool Section for some details of mobility and selfcare   Therapy/Group: Individual Therapy  Sharlyn Odonnel OTR/L 12/15/2020, 12:39 PM

## 2020-12-15 NOTE — Progress Notes (Signed)
Physical Therapy Session Note  Patient Details  Name: Jacqueline Bonilla MRN: [id number] Date of Birth: [birth year]  Today's Date: 12/15/2020 PT Individual Time: [phone] and 1515-1540 PT Individual Time Calculation (min): 60 min and 25 min  Short Term Goals: Week 1:  PT Short Term Goal 1 (Week 1): STG= LTG based on ELOS  Skilled Therapeutic Interventions/Progress Updates: Tx1:Pt presented in recliner agreeable to therapy. Pt denies pain at rest. Performed ambulatory transfer to EOB to obtain shoes and donned with set up. Pt then ambulated to day room with CGA fading to close S without AD. Pt participated in obstacle course including weaving through narrow cones, stepping over thresholds and hurdles, and standing on Airex to toss horse shoes for forced use of RUE. Pt then participated in toe taps to cones alternating R and L while walking. Pt noted to have increased R lean and forward flexed posture while attempting to step to R. Pt also participated in static standing on Airex while performing alternating shoulder flexion to 90, gentle permutations, and static stand with narrow BOS. Pt also participated in four square activities with emphasis on leading with R foot. With spotting (looking at snowflake on door) pt was able to maintain upright posture and step backwards over stick! Pt also participated in diagonals stepping forward/backwards leading with R and L with pt requiring modA when stepping on back left diagonal. Pt then participated in backwards walking 34f x 3 with HHA and verbal cues for increased step length. By third bout pt demonstrating wider BOS and longer steps performing step through vs step to. Pt then ambulated back to room at end of session close supervision and returned to recliner. Pt left in recliner at end of session with seat alarm on, call bell within reach and needs met.   Tx2: Pt presented in recliner agreeable to therapy. Pt denies pain during session. Pt ambulated to day  room close supervision and session focused on Wii fit activities using balance board. Pt participate din tilt table, soccer game, and bubble/canyon activity with CGA overall. Pt performed best with tilt game demonstrating fair ankle strategy with intermittent cues for erect posture. Pt had more difficulty with soccer game performing lateral shifts as well as bubble/canyon activity however was able to improve latter activity with min multimodal cues. Pt then ambulated back to room close supervision with cues for increased R arm swing. Pt transferred to bed at end of session with supervision and pt left in bed with bed alarm on, call bell within reach and needs met.        Therapy Documentation Precautions:  Precautions Precautions: Fall, Other (comment) Precaution Comments: apraxic, ataxic Restrictions Weight Bearing Restrictions: No General:   Vital Signs:   Pain: Pain Assessment Pain Scale: Faces Pain Score: 0-No pain Mobility:   Locomotion :    Trunk/Postural Assessment :    Balance:   Exercises:   Other Treatments:      Therapy/Group: Individual Therapy  Gilmar Bua 12/15/2020, 1:25 PM

## 2020-12-15 NOTE — Progress Notes (Signed)
Occupational Therapy Discharge Summary  Patient Details  Name: Jacqueline Bonilla MRN: [id number] Date of Birth: 06-Feb-1955    Patient has met 36 of 14 long term goals due to improved activity tolerance, improved balance, postural control, ability to compensate for deficits, functional use of  RIGHT upper extremity, improved attention, improved awareness, and improved coordination.  Patient to discharge at overall Supervision level.  Patient's care partner is independent to provide the necessary physical and cognitive assistance at discharge.    Reasons goals not met: NA  Recommendation:  Patient will benefit from ongoing skilled OT services in outpatient setting to continue to advance functional skills in the area of BADL and Reduce care partner burden.  Feel pt will continue to benefit from follow-up outpatient OT to address motor planning deficits, balance, and ADL function.    Equipment: No equipment provided  Reasons for discharge: treatment goals met and discharge from hospital  Patient/family agrees with progress made and goals achieved: Yes  OT Discharge Precautions/Restrictions  Precautions Precautions: Fall;Other (comment) Precaution Comments: apraxic, ataxic Restrictions Weight Bearing Restrictions: No   ADL ADL Eating: Modified independent Where Assessed-Eating: Chair Grooming: Supervision/safety Where Assessed-Grooming: Standing at sink Upper Body Bathing: Supervision/safety Where Assessed-Upper Body Bathing: Shower Lower Body Bathing: Supervision/safety Where Assessed-Lower Body Bathing: Shower Upper Body Dressing: Supervision/safety Where Assessed-Upper Body Dressing: Edge of bed Lower Body Dressing: Supervision/safety Where Assessed-Lower Body Dressing: Edge of bed Toileting: Supervision/safety Where Assessed-Toileting: Glass blower/designer: Close supervision Toilet Transfer Method: Counselling psychologist: Grab bars, Raised toilet  seat Tub/Shower Transfer: Close supervison Clinical cytogeneticist Method: Magazine features editor: Distant supervision Social research officer, government Method: Heritage manager: Gaffer Baseline Vision/History: 1 Wears glasses Patient Visual Report: No change from baseline Eye Alignment: Within Functional Limits Ocular Range of Motion: Within Functional Limits Alignment/Gaze Preference: Within Defined Limits Tracking/Visual Pursuits: Decreased smoothness of horizontal tracking;Decreased smoothness of vertical tracking Saccades: Additional head turns occurred during testing;Additional eye shifts occurred during testing;Impaired - to be further tested in functional context;Decreased speed of saccadic movement Convergence: Within functional limits Visual Fields: Right visual field deficit Depth Perception: Overshoots;Undershoots Perception  Perception: Impaired Comments: slight right inattention Praxis Praxis: Impaired Praxis Impairment Details: Motor planning;Ideomotor Cognition Overall Cognitive Status: History of cognitive impairments - at baseline Arousal/Alertness: Awake/alert Orientation Level: Oriented X4 Year: 2022 Month: December Day of Week: Correct Attention: Selective;Sustained;Focused Focused Attention: Appears intact Sustained Attention: Appears intact Selective Attention: Impaired Memory: Impaired Memory Impairment: Storage deficit;Retrieval deficit Immediate Memory Recall: Sock;Blue;Bed Memory Recall Sock: Not able to recall Memory Recall Blue: Without Cue Memory Recall Bed: Without Cue Awareness: Impaired Awareness Impairment: Intellectual impairment Problem Solving: Impaired Safety/Judgment: Impaired Sensation Sensation Light Touch: Appears Intact Hot/Cold: Not tested Proprioception: Impaired Detail Proprioception Impaired Details: Impaired RUE Stereognosis: Impaired Detail Stereognosis Impaired Details: Impaired  RUE Coordination Gross Motor Movements are Fluid and Coordinated: No Fine Motor Movements are Fluid and Coordinated: No Coordination and Movement Description: Pt with decreased FM coordination and gross motor coordination in the RUE compared to the left.  She uses the UE at a diminshed level for selfcare tasks. Finger Nose Finger Test: Slower on the right compared to the left Motor  Motor Motor: Hemiplegia Motor - Discharge Observations: Pt still with slight right hemiparesis Mobility  Bed Mobility Bed Mobility: Supine to Sit;Sit to Supine Rolling Right: Supervision/verbal cueing Rolling Left: Supervision/Verbal cueing Supine to Sit: Supervision/Verbal cueing Sit to Supine: Supervision/Verbal cueing Transfers Sit to Stand: Supervision/Verbal cueing Stand to Sit:  Supervision/Verbal cueing  Trunk/Postural Assessment  Cervical Assessment Cervical Assessment: Exceptions to [facility] (slight forward head) Thoracic Assessment Thoracic Assessment: Exceptions to [facility] (slight thoracic rounding) Lumbar Assessment Lumbar Assessment: Within Functional Limits  Balance Balance Balance Assessed: Yes Static Sitting Balance Static Sitting - Balance Support: Feet supported Static Sitting - Level of Assistance: 7: Independent Dynamic Sitting Balance Dynamic Sitting - Balance Support: During functional activity Dynamic Sitting - Level of Assistance: 5: Stand by assistance Static Standing Balance Static Standing - Balance Support: During functional activity Static Standing - Level of Assistance: 5: Stand by assistance Dynamic Standing Balance Dynamic Standing - Balance Support: During functional activity Dynamic Standing - Level of Assistance: 5: Stand by assistance Extremity/Trunk Assessment RUE Assessment RUE Assessment: Exceptions to [facility] Passive Range of Motion (PROM) Comments: WFLs Active Range of Motion (AROM) Comments: WFLS General Strength Comments: Pt with decreased FM and gross motor  coordination but uses at a diminshed level for selfcare tasks. Slight decreased elbow flexion noted with shoulder flexion above 90 degrees. LUE Assessment LUE Assessment: Within Functional Limits   MCGUIRE,JAMES 12/15/2020, 6:21 PM

## 2020-12-16 NOTE — Progress Notes (Signed)
Occupational Therapy Session Note  Patient Details  Name: Jacqueline Bonilla MRN: [id number] Date of Birth: 08-Jun-1955  Today's Date: 12/16/2020 OT Individual Time: 1107-1200 OT Individual Time Calculation (min): 53 min    Short Term Goals: Week 1:  OT Short Term Goal 1 (Week 1): Pt will complete UB dressing with supervision. OT Short Term Goal 2 (Week 1): Pt will complete LB dressing with min assist. OT Short Term Goal 3 (Week 1): Pt will complete toileting tasks with supervison and no assistive device. OT Short Term Goal 4 (Week 1): Pt will complete two grooming tasks at supervision level in standing.  Skilled Therapeutic Interventions/Progress Updates:    Pt received in room handed off from PT, denies pain and husband present, agreeable to therapy. Session focus on BUE strengthening, motor planning, activity tolerance, RUE NMR in prep for improved ADL/IADL/func mobility performance + decreased caregiver burden.  Requesting room level therapy due to fatigue, declined need for ADL.  Issued red medium soft theraputty and printed HEP for R FMC/NMR. Pt able to return demonstration with good technique/increased effort to complete putty squeezes, pinch and pull, rolling into log/ball shape, digit flexion/extension.   Pt practiced flipping over cards with R hand, difficulty only flipping one card at a time and stacking neatly. Rated activity as very challenging.   Finally, completed 2x15 of the following with 2 lb dowel rod: forward shoulder flexion, forward/back circles, chest press, and volleyball toss.  Discussed pt hobbies and ways to adapt to promote RUE NMR and continued leisure participation. Pt enjoys playing ping pong and lifting weights.  Pt left seated in recliner with husband present with chair alarm engaged, call bell in reach, and all immediate needs met.    Therapy Documentation Precautions:  Precautions Precautions: Fall, Other (comment) Precaution Comments: apraxic,  ataxic Restrictions Weight Bearing Restrictions: No  Pain:  denies ADL: See Care Tool for more details.   Therapy/Group: Individual Therapy  Volanda Napoleon MS, OTR/L  12/16/2020, 6:55 AM

## 2020-12-16 NOTE — Progress Notes (Signed)
Physical Therapy Discharge Summary  Patient Details  Name: Jacqueline Bonilla MRN: [id number] Date of Birth: 02/16/1955  Today's Date: 12/16/2020 PT Individual Time: 1410-1508 PT Individual Time Calculation (min): 58 min    Patient has met 9 of 9 long term goals due to improved activity tolerance, improved balance, improved postural control, functional use of  right upper extremity and right lower extremity, improved attention, improved awareness, and improved coordination.  Patient to discharge at an ambulatory level, no AD, requiring close Supervision.   Patient's care partner is independent to provide the necessary physical and cognitive assistance at discharge.  All goals met.   Recommendation:  Patient will benefit from ongoing skilled PT services in outpatient setting to continue to advance safe functional mobility, address ongoing impairments in dynamic standing balance, R attention, R hemibody NMR, dynamic gait training, community mobility, and minimize fall risk.  Equipment: No equipment provided, none needed  Reasons for discharge: treatment goals met and discharge from hospital  Patient/family agrees with progress made and goals achieved: Yes  Skilled Therapeutic Interventions/Progress Updates:  Pt received sitting in recliner and agreeable to therapy session; however, reporting fatigue. Sit<>stands, no AD, with supervision throughout session. Gait ~250f to gym, no AD, with supervision - no instances of instability - reciprocal stepping pattern with adequate foot clearance - does demo decreased trunk rotation and decreased R arm swing. Gait training ~174fup/down ramp, ~1052fx over mulch, and up/down curb step using HRs as needed with close supervision - pt demos significant shuffled gait in the mulch not clearing feet well - educated pt on this and need for assistance when ambulating outside. Stair navigation training ascending/descending 4 steps (6" height) 3x using B HRs with  step-to pattern leading with R LE on ascent and L LE on descent with sideways technique coming down though using both HRs. Provided pt with the below HEP and educated on safe set-up and family assistance to perform the exercises. Pt continuing to report fatigue; however, agreeable to continue with session. Gait training ~150f56f day room, no AD, with supervision and pt continuing to demo recall to carryover increased R arm swing with little cuing. Performed B UE and B LE reciprocal movement patterns on Nustep against level 4 resistance for 7 minutes totaling 330 steps targeting endurance and reciprocal limb movements. Gait back to room, no AD, supervision. Gait in/out bathroom supervision. Standing with supervision for balance safety completed LB clothing management without assist - continent of bladder and performed seated peri-care without assist. Standing hand hygiene without assist. Sit>supine supervision. Pt left supine in bed with needs in reach and bed alarm on.   Access Code: [email or web address]: [email or web address] Date: 12/16/2020 Prepared by: CarlPage Spiroercises Walking - 1 x daily - 3 x weekly - 2 sets - 2 minutes hold Alternating Step Taps with Counter Support - 1 x daily - 3 x weekly - 2 sets - 20 reps Side Stepping with Counter Support - 1 x daily - 3 x weekly - 2 sets - 10 reps Standing Tandem Balance with Counter Support - 1 x daily - 3 x weekly - 2 sets - 30 second hold    PT Discharge Precautions/Restrictions Precautions Precautions: Fall;Other (comment) Precaution Comments: motor apraxia, slight R inattention Restrictions Weight Bearing Restrictions: No Pain Pain Assessment Pain Scale: 0-10 Pain Score: 0-No pain Pain Interference Pain Interference Pain Effect on Sleep: 1. Rarely or not at all Pain Interference with Therapy Activities: 1. Rarely or not at all Pain  Interference with Day-to-Day Activities: 1. Rarely or not at all Vision/Perception   Vision - History Ability to See in Adequate Light: 0 Adequate Perception Perception: Impaired Inattention/Neglect: Other (comment) (slight R inattention) Praxis Praxis: Impaired Praxis Impairment Details: Motor planning;Ideomotor  Cognition Overall Cognitive Status: Impaired/Different from baseline Arousal/Alertness: Awake/alert Orientation Level: Oriented X4 Year: 2022 Month: December Day of Week: Correct Attention: Focused;Sustained;Selective Focused Attention: Appears intact Sustained Attention: Appears intact Selective Attention: Impaired Memory: Impaired Awareness: Impaired Problem Solving: Impaired Safety/Judgment: Impaired Sensation Sensation Light Touch: Appears Intact Hot/Cold: Not tested Proprioception: Appears Intact Stereognosis: Not tested Coordination Gross Motor Movements are Fluid and Coordinated: Yes Coordination and Movement Description: Gross motor movements in LEs are WFL for functional mobility tasks with pt primarily presenting with B UE coordination impairments with apraxia Heel Shin Test: [facility] and symmetrical (pt able to motor plan this now with visual demonstration) Motor  Motor Motor: Hemiplegia;Motor apraxia Motor - Discharge Observations: Pt still with slight right hemiparesis  Mobility Bed Mobility Bed Mobility: Supine to Sit;Sit to Supine Supine to Sit: Supervision/Verbal cueing Sit to Supine: Supervision/Verbal cueing Transfers Transfers: Sit to Stand;Stand to Sit;Stand Pivot Transfers Sit to Stand: Supervision/Verbal cueing Stand to Sit: Supervision/Verbal cueing Stand Pivot Transfers: Supervision/Verbal cueing Stand Pivot Transfer Details: Verbal cues for precautions/safety Transfer (Assistive device): None Locomotion  Gait Ambulation: Yes Gait Assistance: Supervision/Verbal cueing Gait Distance (Feet): 200 Feet Assistive device: None Gait Assistance Details: Verbal cues for precautions/safety;Verbal cues for gait pattern;Verbal  cues for technique Gait Gait: Yes Gait Pattern: Within Functional Limits Gait Pattern: Step-through pattern;Narrow base of support Gait velocity: decreased Stairs / Additional Locomotion Stairs: Yes Stairs Assistance: Supervision/Verbal cueing Stair Management Technique: Two rails;Step to pattern (step-to leading with R LE on ascent and then L LE on descent with pt going down sideways) Number of Stairs: 12 Height of Stairs: 6 Ramp: Supervision/Verbal cueing Curb: Supervision/Verbal cueing Wheelchair Mobility Wheelchair Mobility: No  Trunk/Postural Assessment  Cervical Assessment Cervical Assessment: Exceptions to [facility] (slight forward head) Thoracic Assessment Thoracic Assessment: Exceptions to [facility] (slight thoracic rounding) Lumbar Assessment Lumbar Assessment: Within Functional Limits Postural Control Postural Control: Within Functional Limits  Balance  Balance Balance Assessed: Yes Standardized Balance Assessment Standardized Balance Assessment: Berg Balance Test Berg Balance Test Sit to Stand: Able to stand without using hands and stabilize independently Standing Unsupported: Able to stand safely 2 minutes Sitting with Back Unsupported but Feet Supported on Floor or Stool: Able to sit safely and securely 2 minutes Stand to Sit: Sits safely with minimal use of hands Transfers: Able to transfer safely, definite need of hands Standing Unsupported with Eyes Closed: Able to stand 10 seconds safely Standing Ubsupported with Feet Together: Able to place feet together independently and stand for 1 minute with supervision (minor sway) From Standing, Reach Forward with Outstretched Arm: Can reach forward >12 cm safely (5") From Standing Position, Pick up Object from Floor: Able to pick up shoe, needs supervision From Standing Position, Turn to Look Behind Over each Shoulder: Looks behind one side only/other side shows less weight shift Turn 360 Degrees: Able to turn 360 degrees safely  but slowly Standing Unsupported, Alternately Place Feet on Step/Stool: Able to complete 4 steps without aid or supervision Standing Unsupported, One Foot in Front: Able to plae foot ahead of the other independently and hold 30 seconds Standing on One Leg: Tries to lift leg/unable to hold 3 seconds but remains standing independently Total Score: 43 Static Sitting Balance Static Sitting - Balance Support: Feet supported Static  Sitting - Level of Assistance: 7: Independent Dynamic Sitting Balance Dynamic Sitting - Balance Support: During functional activity Dynamic Sitting - Level of Assistance: 5: Stand by assistance Static Standing Balance Static Standing - Balance Support: During functional activity;No upper extremity supported Static Standing - Level of Assistance: 5: Stand by assistance Dynamic Standing Balance Dynamic Standing - Balance Support: During functional activity Dynamic Standing - Level of Assistance: 5: Stand by assistance Extremity Assessment      RLE Assessment RLE Assessment: Exceptions to [facility] Active Range of Motion (AROM) Comments: WFL/WNL RLE Strength Right Hip Flexion: 4+/5 Right Knee Flexion: 4+/5 Right Knee Extension: 4+/5 Right Ankle Dorsiflexion: 4+/5 Right Ankle Plantar Flexion: 4+/5 LLE Assessment LLE Assessment: Exceptions to [facility] Active Range of Motion (AROM) Comments: WFL/WNL LLE Strength Left Hip Flexion: 4+/5 Left Knee Flexion: 4+/5 Left Knee Extension: 4+/5 Left Ankle Dorsiflexion: 4+/5 Left Ankle Plantar Flexion: 4+/5    Tawana Scale , PT, DPT, NCS, CSRS 12/16/2020, 12:24 PM

## 2020-12-16 NOTE — Progress Notes (Signed)
Inpatient Rehabilitation  Discharge Medication Review by a Pharmacist  A complete drug regimen review was completed for this patient to identify any potential clinically significant medication issues.  High Risk Drug Classes Is patient taking? Indication by Medication  Antipsychotic No   Anticoagulant No   Antibiotic No   Opioid No   Antiplatelet Yes ASA/Brilinta for CVA prevention  Hypoglycemics/insulin No   Vasoactive Medication Yes Losartan for HTN  Chemotherapy No   Other No      Type of Medication Issue Identified Description of Issue Recommendation(s)  Drug Interaction(s) (clinically significant)     Duplicate Therapy     Allergy     No Medication Administration End Date     Incorrect Dose     Additional Drug Therapy Needed     Significant med changes from prior encounter (inform family/care partners about these prior to discharge). D/c melatonin   Other       Clinically significant medication issues were identified that warrant physician communication and completion of prescribed/recommended actions by midnight of the next day:  Yes  Name of provider notified for urgent issues identified:  Marlowe Sh[place], PA  Provider Method of Notification: chat to discuss LOT of ASA Brillinta  Pharmacist comments: Plan for ASA/Brilinta LOA not concretely stated. Patient has referral to outpatient neurology. Family counseled by PA.  Time spent performing this drug regimen review (minutes):  79min  Jessy Calixte S. Alford Highland, PharmD, BCPS Clinical Staff Pharmacist [email or web address] [place] 12/16/2020 2:46 PM

## 2020-12-16 NOTE — Progress Notes (Signed)
Inpatient Rehabilitation Care Coordinator Discharge Note   Patient Details  Name: Jacqueline Bonilla MRN: [id number] Date of Birth: [birth year]   Discharge location: Home  Length of Stay: 9 Days  Discharge activity level: Sup  Home/community participation: spouse  Patient response QQ:PYPPJK Literacy - How often do you need to have someone help you when you read instructions, pamphlets, or other written material from your doctor or pharmacy?: Never  Patient response DT:OIZTIW Isolation - How often do you feel lonely or isolated from those around you?: Never  Services provided included: MD, SW, Pharmacy, TR, CM, RN, SLP, OT, PT, RD  Financial Services:  Financial Services Utilized: Francis offered to/list presented to:    Follow-up services arranged:  Outpatient    Outpatient Servicies: [facility]      Patient response to transportation need: Is the patient able to respond to transportation needs?: Yes In the past 12 months, has lack of transportation kept you from medical appointments or from getting medications?: No In the past 12 months, has lack of transportation kept you from meetings, work, or from getting things needed for daily living?: No    Comments (or additional information):  Patient/Family verbalized understanding of follow-up arrangements:  Yes  Individual responsible for coordination of the follow-up plan: Eduard Clos [phone]  Confirmed correct DME delivered: [name] 12/16/2020    [name]

## 2020-12-16 NOTE — Progress Notes (Signed)
PROGRESS NOTE   Subjective/Complaints:   When patient was in physical therapy her husband discussed that the patient has had some anger directed at him.  He has discussed this with his family physician in regards to her progressive cognitive decline.  We discussed that while patient may have some improvements after her current stroke her overall trajectory is a decline  ROS-  Pt denies SOB, abd pain, CP, N/V/C/D, and vision changes  Objective:   No results found. No results for input(s): WBC, HGB, HCT, PLT in the last 72 hours.  No results for input(s): NA, K, CL, CO2, GLUCOSE, BUN, CREATININE, CALCIUM in the last 72 hours.   Intake/Output Summary (Last 24 hours) at 12/16/2020 0937 Last data filed at 12/16/2020 0900 Gross per 24 hour  Intake 472 ml  Output --  Net 472 ml         Physical Exam: Vital Signs Blood pressure 108/68, pulse 72, temperature (!) 97.5 F (36.4 C), temperature source Oral, resp. rate 18, height 5\' 4"  (1.626 m), weight 49.3 kg, last menstrual period 01/02/2003, SpO2 100 %.  General: No acute distress Mood and affect are appropriate Heart: Regular rate and rhythm no rubs murmurs or extra sounds Lungs: Clear to auscultation, breathing unlabored, no rales or wheezes Abdomen: Positive bowel sounds, soft nontender to palpation, nondistended Extremities: No clubbing, cyanosis, or edema Skin: No evidence of breakdown, no evidence of rash    Neurologic: Cranial nerves II through XII intact, motor strength is 5/5 in bilateral deltoid, bicep, tricep, grip, hip flexor, knee extensors, ankle dorsiflexor and plantar flexor Sensory exam normal sensation to light touch and proprioception in bilateral upper and lower extremities Cerebellar exam normal finger to nose to finger as well as heel to shin in bilateral upper and lower extremities Neg dysdiadochokinesis , in UEs, finger to thumb opposition mildly  reduced on RIght side  Musculoskeletal: Full range of motion in all 4 extremities. No joint swelling   Assessment/Plan: 1. Functional deficits which require 3+ hours per day of interdisciplinary therapy in a comprehensive inpatient rehab setting. Physiatrist is providing close team supervision and 24 hour management of active medical problems listed below. Physiatrist and rehab team continue to assess barriers to discharge/monitor patient progress toward functional and medical goals  Care Tool:  Bathing    Body parts bathed by patient: Right arm, Left arm, Chest, Abdomen, Front perineal area, Buttocks, Right upper leg, Left upper leg, Left lower leg, Right lower leg, Face         Bathing assist Assist Level: Supervision/Verbal cueing     Upper Body Dressing/Undressing Upper body dressing   What is the patient wearing?: Pull over shirt    Upper body assist Assist Level: Supervision/Verbal cueing    Lower Body Dressing/Undressing Lower body dressing      What is the patient wearing?: Underwear/pull up, Pants     Lower body assist Assist for lower body dressing: Supervision/Verbal cueing     Toileting Toileting    Toileting assist Assist for toileting: Supervision/Verbal cueing     Transfers Chair/bed transfer  Transfers assist     Chair/bed transfer assist level: Supervision/Verbal cueing  Locomotion Ambulation   Ambulation assist      Assist level: Contact Guard/Touching assist Assistive device: No Device Max distance: 100'   Walk 10 feet activity   Assist     Assist level: Contact Guard/Touching assist Assistive device: Hand held assist   Walk 50 feet activity   Assist    Assist level: Minimal Assistance - Patient > 75% Assistive device: Hand held assist    Walk 150 feet activity   Assist    Assist level: Minimal Assistance - Patient > 75% Assistive device: Hand held assist    Walk 10 feet on uneven surface   activity   Assist     Assist level: Minimal Assistance - Patient > 75% Assistive device: Hand held assist   Wheelchair     Assist Is the patient using a wheelchair?: No             Wheelchair 50 feet with 2 turns activity    Assist            Wheelchair 150 feet activity     Assist          Blood pressure 108/68, pulse 72, temperature (!) 97.5 F (36.4 C), temperature source Oral, resp. rate 18, height 5\' 4"  (1.626 m), weight 49.3 kg, last menstrual period 01/02/2003, SpO2 100 %.  Medical Problem List and Plan: 1.  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  Antithrombotics: -DVT/anticoagulation:  Mechanical:  Antiembolism stockings, knee (TED hose) Bilateral lower extremities             -antiplatelet therapy: Aspirin 81 mg daily and Brilinta 90 mg twice daily 3. Pain Management: Tylenol as needed  12/10- pt doesn't like even tylenol- will try Lidoderm patches 2 of them for back/hip pain- 8pm to 8am and monitor 4. Mood: Provide emotional support             -antipsychotic agents: N/A 5. Neuropsych: This patient is capable of making decisions on her own behalf. 6. Skin/Wound Care: Routine skin checks 7. Fluids/Electrolytes/Nutrition: Routine in and outs with follow-up chemistries 8.  Seizure prophylaxis.  EEG negative.  Keppra 500 mg twice daily 9.  Hypertension.  Cozaar 25 mg daily.  Monitor with increased mobility Vitals:   12/15/20 2013 12/16/20 0325  BP: 119/75 108/68  Pulse: 80 72  Resp: 18 18  Temp: 98.5 F (36.9 C) (!) 97.5 F (36.4 C)  SpO2: 100% 100%  12/16 controlled    10.  Hyperlipidemia.  Lipitor 11.  History of thrombocytopenia.  Latest platelet count 208,000. 12.  Urinary retention.    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

## 2020-12-16 NOTE — Progress Notes (Signed)
Physical Therapy Session Note  Patient Details  Name: Jacqueline Bonilla MRN: [id number] Date of Birth: 1955/06/07  Today's Date: 12/16/2020 PT Individual Time: 1036-1105 PT Individual Time Calculation (min): 29 min   Short Term Goals: Week 1:  PT Short Term Goal 1 (Week 1): STG= LTG based on ELOS  Skilled Therapeutic Interventions/Progress Updates:    Pt received sitting in recliner with her husband, [place], present and [place], [place] present. Pt agreeable to therapy session. Sit<>stands, no AD, with supervision throughout session. Gait training ~146ft 2x to/from ortho gym, no AD, with close supervision - demos reciprocal pattern and only 1x minor R LOB with pt able to perform appropriate stepping strategy to regain balance without physical assist. Continues to have R/L differentiation impairment with pt turning L when asked to turn R as well as R inattention. Patient participated in [employer] and demonstrates increased fall risk as noted by score of 43/56; however, this is a significant improvement compared to initial evaluation where pt scored 22/56.  (<36= high risk for falls, close to 100%; 37-45 significant >80%; 46-51 moderate >50%; 52-55 lower >25%). At end of session, pt left seated in recliner as hand-off to OT.  Therapy Documentation Precautions:  Precautions Precautions: Fall, Other (comment) Precaution Comments: apraxic, ataxic Restrictions Weight Bearing Restrictions: No   Pain:  Denies pain during session.  Balance: Standardized Balance Assessment Standardized Balance Assessment: Berg Balance Test Berg Balance Test Sit to Stand: Able to stand without using hands and stabilize independently Standing Unsupported: Able to stand safely 2 minutes Sitting with Back Unsupported but Feet Supported on Floor or Stool: Able to sit safely and securely 2 minutes Stand to Sit: Sits safely with minimal use of hands Transfers: Able to transfer safely, definite need of  hands Standing Unsupported with Eyes Closed: Able to stand 10 seconds safely Standing Ubsupported with Feet Together: Able to place feet together independently and stand for 1 minute with supervision (minor sway) From Standing, Reach Forward with Outstretched Arm: Can reach forward >12 cm safely (5") From Standing Position, Pick up Object from Floor: Able to pick up shoe, needs supervision From Standing Position, Turn to Look Behind Over each Shoulder: Looks behind one side only/other side shows less weight shift Turn 360 Degrees: Able to turn 360 degrees safely but slowly Standing Unsupported, Alternately Place Feet on Step/Stool: Able to complete 4 steps without aid or supervision Standing Unsupported, One Foot in Front: Able to plae foot ahead of the other independently and hold 30 seconds Standing on One Leg: Tries to lift leg/unable to hold 3 seconds but remains standing independently Total Score: 43   Therapy/Group: Individual Therapy  Tawana Scale , PT, DPT, NCS, CSRS  12/16/2020, 7:57 AM

## 2020-12-16 NOTE — Plan of Care (Signed)
Problem: RH Expression Communication Goal: LTG Patient will increase word finding of common (SLP) Description: LTG:  Patient will increase word finding of common objects/daily info/abstract thoughts with cues using compensatory strategies (SLP). Outcome: Completed/Met   Problem: RH Problem Solving Goal: LTG Patient will demonstrate problem solving for (SLP) Description: LTG:  Patient will demonstrate problem solving for basic/complex daily situations with cues  (SLP) Outcome: Completed/Met   Problem: RH Memory Goal: LTG Patient will use memory compensatory aids to (SLP) Description: LTG:  Patient will use memory compensatory aids to recall biographical/new, daily complex information with cues (SLP) Outcome: Completed/Met   Problem: RH Awareness Goal: LTG: Patient will demonstrate awareness during functional activites type of (SLP) Description: LTG: Patient will demonstrate awareness during functional activites type of (SLP) Outcome: Completed/Met   

## 2020-12-16 NOTE — Progress Notes (Signed)
Speech Language Pathology Discharge Summary  Patient Details  Name: Jacqueline Bonilla MRN: [id number] Date of Birth: Sep 30, 1955  Today's Date: 12/16/2020 SLP Individual Time: 0900-0945 SLP Individual Time Calculation (min): 45 min  Skilled Therapeutic Interventions: Skilled ST treatment focused on cognitive goals. SLP facilitated session by providing min A verbal cues for visual scanning for locating information on a restaurant menu. She required supervision A verbal cue for problem solving to determine deals and basic calculations. Pt also participated in identifying unsafe safety situations, generating solutions, and anticipatory problem solving with supervision A verbal cues. Educated spouse on patient's progress, continued areas of deficits, memory strategies, and recommendations for outpatient therapy at discharge. Spouse verbalized understanding.  Patient was left in chair with alarm activated and immediate needs within reach at end of session.   Patient has met 4 of 4 long term goals.  Patient to discharge at overall Supervision level.  Reasons goals not met: N/A   Clinical Impression/Discharge Summary: Patient has made functional gains and has met 4 of 4 long-term goals this admission due to improved problem solving, intellectual awareness, working memory and functional recall during session, and word finding skills. Patient continues to exhibit decreased information processing, complex problem solving, short-term recall, executive functions, and anticipatory awareness. Patient is currently an overall supervision assist for basic cognitive tasks and requires increased support consistent with minimal verbal assist for higher level cognitive tasks, day-to-day recall, and awareness. Pt exhibits minimal word finding difficulty at conversational level and is able to compensate and repair with modified independence-to-supervision A cues. Patient and family education is complete and patient to  discharge at overall supervision level. Patient's care partner is independent to provide the necessary physical and cognitive assistance at discharge. Patient would benefit from continued SLP services in outpatient setting to maximize cognitive-linguistic function and functional independence.    Care Partner:  Caregiver Able to Provide Assistance: Yes  Type of Caregiver Assistance: Physical;Cognitive  Recommendation:  Outpatient SLP;24 hour supervision/assistance  Rationale for SLP Follow Up: Reduce caregiver burden;Maximize cognitive function and independence   Equipment: None   Reasons for discharge: Treatment goals met;Discharged from hospital   Patient/Family Agrees with Progress Made and Goals Achieved: Yes    Cornelius Schuitema T Caral Whan 12/16/2020, 12:53 PM

## 2020-12-16 NOTE — Plan of Care (Signed)
Problem: RH Balance °Goal: LTG: Patient will maintain dynamic sitting balance (OT) °Description: LTG:  Patient will maintain dynamic sitting balance with assistance during activities of daily living (OT) °Outcome: Completed/Met °Goal: LTG Patient will maintain dynamic standing with ADLs (OT) °Description: LTG:  Patient will maintain dynamic standing balance with assist during activities of daily living (OT)  °Outcome: Completed/Met °  °Problem: Sit to Stand °Goal: LTG:  Patient will perform sit to stand in prep for activites of daily living with assistance level (OT) °Description: LTG:  Patient will perform sit to stand in prep for activites of daily living with assistance level (OT) °Outcome: Completed/Met °  °Problem: RH Eating °Goal: LTG Patient will perform eating w/assist, cues/equip (OT) °Description: LTG: Patient will perform eating with assist, with/without cues using equipment (OT) °Outcome: Completed/Met °  °Problem: RH Grooming °Goal: LTG Patient will perform grooming w/assist,cues/equip (OT) °Description: LTG: Patient will perform grooming with assist, with/without cues using equipment (OT) °Outcome: Completed/Met °  °Problem: RH Bathing °Goal: LTG Patient will bathe all body parts with assist levels (OT) °Description: LTG: Patient will bathe all body parts with assist levels (OT) °Outcome: Completed/Met °  °Problem: RH Dressing °Goal: LTG Patient will perform upper body dressing (OT) °Description: LTG Patient will perform upper body dressing with assist, with/without cues (OT). °Outcome: Completed/Met °Goal: LTG Patient will perform lower body dressing w/assist (OT) °Description: LTG: Patient will perform lower body dressing with assist, with/without cues in positioning using equipment (OT) °Outcome: Completed/Met °  °Problem: RH Toileting °Goal: LTG Patient will perform toileting task (3/3 steps) with assistance level (OT) °Description: LTG: Patient will perform toileting task (3/3 steps) with  assistance level (OT)  °Outcome: Completed/Met °  °Problem: RH Functional Use of Upper Extremity °Goal: LTG Patient will use RT/LT upper extremity as a (OT) °Description: LTG: Patient will use right/left upper extremity as a stabilizer/gross assist/diminished/nondominant/dominant level with assist, with/without cues during functional activity (OT) °Outcome: Completed/Met °  °Problem: RH Toilet Transfers °Goal: LTG Patient will perform toilet transfers w/assist (OT) °Description: LTG: Patient will perform toilet transfers with assist, with/without cues using equipment (OT) °Outcome: Completed/Met °  °Problem: RH Tub/Shower Transfers °Goal: LTG Patient will perform tub/shower transfers w/assist (OT) °Description: LTG: Patient will perform tub/shower transfers with assist, with/without cues using equipment (OT) °Outcome: Completed/Met °  °Problem: RH Attention °Goal: LTG Patient will demonstrate this level of attention during functional activites (OT) °Description: LTG:  Patient will demonstrate this level of attention during functional activites  (OT) °Outcome: Completed/Met °  °Problem: RH Awareness °Goal: LTG: Patient will demonstrate awareness during functional activites type of (OT) °Description: LTG: Patient will demonstrate awareness during functional activites type of (OT) °Outcome: Completed/Met °  °

## 2020-12-17 NOTE — Progress Notes (Signed)
Patient being D/C this AM. Patient and husband received D/C instructions yesterday. Patient nor husband have no questions or concerns about leaving. Spoke to Dr. Letta Pate  states ok to D/C, patient is stable. Patient will be going home in private vehicle with her husband.

## 2020-12-17 NOTE — Progress Notes (Signed)
Spoke with patient regarding Tumeric. Recommended avoiding or limiting use of Tumeric while taking antiplatelet medications d/t potential for increased risk of bleeding when taken concomitantly.

## 2020-12-19 ENCOUNTER — Other Ambulatory Visit: Payer: Self-pay

## 2020-12-19 ENCOUNTER — Encounter: Payer: Self-pay | Admitting: Speech Pathology

## 2020-12-19 ENCOUNTER — Ambulatory Visit: Payer: Medicare HMO | Admitting: Occupational Therapy

## 2020-12-19 ENCOUNTER — Encounter: Payer: Self-pay | Admitting: Occupational Therapy

## 2020-12-19 ENCOUNTER — Encounter: Payer: Self-pay | Admitting: Registered Nurse

## 2020-12-19 ENCOUNTER — Ambulatory Visit: Payer: Medicare HMO | Attending: Physician Assistant | Admitting: Speech Pathology

## 2020-12-19 DIAGNOSIS — R4701 Aphasia: Secondary | ICD-10-CM | POA: Insufficient documentation

## 2020-12-19 DIAGNOSIS — M6281 Muscle weakness (generalized): Secondary | ICD-10-CM | POA: Insufficient documentation

## 2020-12-19 DIAGNOSIS — R41841 Cognitive communication deficit: Secondary | ICD-10-CM | POA: Diagnosis not present

## 2020-12-19 DIAGNOSIS — R2689 Other abnormalities of gait and mobility: Secondary | ICD-10-CM

## 2020-12-19 DIAGNOSIS — R262 Difficulty in walking, not elsewhere classified: Secondary | ICD-10-CM | POA: Diagnosis not present

## 2020-12-19 DIAGNOSIS — R41842 Visuospatial deficit: Secondary | ICD-10-CM | POA: Insufficient documentation

## 2020-12-19 DIAGNOSIS — I63512 Cerebral infarction due to unspecified occlusion or stenosis of left middle cerebral artery: Secondary | ICD-10-CM | POA: Diagnosis not present

## 2020-12-19 DIAGNOSIS — R41844 Frontal lobe and executive function deficit: Secondary | ICD-10-CM | POA: Insufficient documentation

## 2020-12-19 DIAGNOSIS — R278 Other lack of coordination: Secondary | ICD-10-CM | POA: Insufficient documentation

## 2020-12-20 ENCOUNTER — Encounter: Payer: Self-pay | Admitting: Speech Pathology

## 2020-12-20 ENCOUNTER — Telehealth: Payer: Self-pay

## 2020-12-20 NOTE — Therapy (Signed)
[facility]. [place], [name], [address] Phone: [phone]   Fax:  [phone]  Occupational Therapy Evaluation  Patient Details  Name: Jacqueline Bonilla MRN: [id number] Date of Birth: 20-May-1955 Referring Provider (OT): Lauraine Rinne, PA-C   Encounter Date: 12/19/2020   OT End of Session - 12/19/20 1322     Visit Number 1    Number of Visits 17    Date for OT Re-Evaluation 03/20/21    Authorization Type [employer]    Authorization Time Period VL: TBD    Progress Note Due on Visit 10    OT Start Time 1318    OT Stop Time 1400    OT Time Calculation (min) 42 min    Activity Tolerance Patient tolerated treatment well    Behavior During Therapy [facility] for tasks assessed/performed;Flat affect            Past Medical History:  Diagnosis Date   Anemia    Anxiety    Depression    Dysmenorrhea    Endometriosis    Fibroid    Hypertension    Microhematuria    negative workup   Osteoporosis    Tachycardia    Thrombocytopenia ([place])     Past Surgical History:  Procedure Laterality Date   BREAST BIOPSY     BUBBLE STUDY  12/08/2020   Procedure: BUBBLE STUDY;  Surgeon: Josue Hector, MD;  Location: [facility];  Service: Cardiovascular;;   CESAREAN SECTION     hysteroscopic resection     implantable loop recorder implant  10/21/2019   Med[employer] Reveal Linq model LNQ 22 ([place] [id number]) implantable loop recorder   IR CT HEAD LTD  12/01/2020   IR PERCUTANEOUS ART THROMBECTOMY/INFUSION INTRACRANIAL INC DIAG ANGIO  12/01/2020   IR US GUIDE VASC ACCESS RIGHT  12/01/2020   RADIOLOGY WITH ANESTHESIA N/A 12/01/2020   Procedure: IR WITH ANESTHESIA - CODE STROKE;  Surgeon: Radiologist, Medication, MD;  Location: [facility];  Service: Radiology;  Laterality: N/A;   TEE WITHOUT CARDIOVERSION N/A 12/08/2020   Procedure: TRANSESOPHAGEAL ECHOCARDIOGRAM (TEE);  Surgeon: Josue Hector, MD;  Location: [facility] ENDOSCOPY;   Service: Cardiovascular;  Laterality: N/A;    There were no vitals filed for this visit.   Subjective Assessment - 12/19/20 1321     Subjective  Pt presents to OP OT w/ primary concerns related to limitations w/ R-sided strength and coordination, balance, and visual perception. Per neurology H&P from the hospital, pt presented to ED on 12/01/20 "via EMA as a code stroke for evaluation of acute onset of aphasia and worsened right sided incoordination relative to her baseline deficit." Pt reports she feels like "everything is worse" since d/c from OP OT at this clinic in Sept 2022.    Pertinent History Cerebral infarction 12/01/20; MRI indicated small acute infarcts in L MCA distribution and SAH overlying bilateral cerebral hemispheres. PMH includes 2 prior strokes in June 2021 (R-sided weakness) and June 2022 (L-sided weakness); vascular dementia; HTN; loop recorder implant 10/2019; osteoporosis; thrombocytopenia; anxiety; anemia    Patient Stated Goals "Anything I can to get better"    Currently in Pain? No/denies             [facility] OT Assessment - 12/19/20 1324       Assessment   Medical Diagnosis L MCA stroke    Referring Provider (OT) Lauraine Rinne, PA-C    Onset Date/Surgical Date 12/01/20    Hand Dominance Right  Next MD Visit 12/30/20 w/ physiatry    Prior Therapy Yes      Precautions   Precautions None   per pt report     Balance Screen   Has the patient fallen in the past 6 months No      Home  Environment   Type of [place] entrance    [place] One level    Dwight bars - tub/shower;Grab bars - toilet    Lives With Spouse      Prior Function   Level of Independence Independent with basic ADLs;Independent with household mobility without device;Independent with community mobility without device    Vocation Retired    Leisure going on walks, exercise      ADL   Eating/Feeding Set up    Occasional difficulty manipulating utensils; needs assist w/ cutting food   Grooming Modified independent    Upper Body Bathing Modified independent    Lower Body Bathing Modified independent    Upper Body Dressing Minimal assistance;Increased time;Needs assist for fasteners    Where Assessed-Upper Body Dressing Unsupported standing   Donning zip-up jacket in 1 min, 2 sec w/ Min A for initiation of task   Lower Body Dressing Minimal assistance;Needs assist for fasteners   Needs assist w/ socks   Toileting -  Hygiene Modified Independent    Tub/Shower Transfer Min guard    Tub/Shower Transfer Method Ambulating    Tub/Shower Transfer Equipment Walk in shower;Increased time;Grab bars      IADL   Prior Level of Function Community Mobility Stopped driving after prior CVA in June 2022      Written Expression   Dominant Hand Right    Written Experience Not tested   To be further assessed     Vision - History   Baseline Vision Wears glasses all the time      Vision Assessment   Ocular Range of Motion Within Functional Limits    Tracking/Visual Pursuits Able to track stimulus in all quads without difficulty    Saccades Decreased speed of saccadic movement    Visual Fields Right visual field deficit   Decreased peripheral vision on R side observed via gross screening   Comment Identified 9/9 targets during simple symbol cancellation screening      Cognition   Overall Cognitive Status Cognition to be further assessed in functional context PRN    Problem Solving Impaired    Executive Function Sequencing;Initiating    Cognition Comments Trail Making Test: Part A in 2 min, 1 sec w/ 0 errors   Difficulty w/ task termination     Posture/Postural Control   Posture Comments Wide BOS when standing to demonstrate doffing/donning of zip-up jacket      Sensation   Light Touch Appears Intact    Stereognosis Impaired by gross assessment    Hot/Cold Appears Intact      Coordination   Gross Motor  Movements are Fluid and Coordinated Yes   Decreased speed of movement   Fine Motor Movements are Fluid and Coordinated No    9 Hole Peg Test Right;Left    Right 9 Hole Peg Test 2 min, 20 sec   7 drops; assist for in-hand manipulation w/ L hand and/or tabletop surface   Left 9 Hole Peg Test 1 min, 6 sec      Perception   Perception Impaired    Comments Difficulty observed w/ visual  perception/visuospatial relationships; to be further assessed      AROM   Overall AROM  Within functional limits for tasks performed    AROM Assessment Site Shoulder;Elbow;Forearm;Wrist;Finger;Thumb      Strength   Overall Strength Deficits    Strength Assessment Site Shoulder;Elbow    Right Shoulder Flexion 3+/5    Right Shoulder Extension 4-/5    Right Shoulder Internal Rotation 4/5   Tested w/ arm adducted   Right Shoulder External Rotation 4/5   Tested w/ arm adducted   Left Shoulder Flexion 4-/5    Left Shoulder Extension 4/5    Left Shoulder Internal Rotation 4/5   Tested w/ arm adducted   Left Shoulder External Rotation 4/5   Tested w/ arm adducted   Right Elbow Flexion 4/5    Right Elbow Extension 4/5    Left Elbow Flexion 4/5    Left Elbow Extension 4/5      Hand Function   Right Hand Gross Grasp Functional    Left Hand Gross Grasp Functional    Comment Pinch strength to be further assessed in functional context             OT Education - 12/19/20 1322     Education Details Education provided on POC and potential interventions and goals for therapy based on initial evaluation findings.    Person(s) Educated Patient    Methods Explanation    Comprehension Verbalized understanding             OT Short Term Goals - 12/19/20 1426       OT SHORT TERM GOAL #1   Title Pt will improve participation in manipulation of clothing fasteners as evidenced by completing 9-Hole Peg Test w/ less than 5 drops using R, dominant hand    Baseline R hand 2 min, 20 sec w/ 7 drops    Time 4     Period Weeks    Status New    Target Date 01/17/21      OT SHORT TERM GOAL #2   Title Pt will improve safety w/ functional overhead reach by increasing strength of R shoulder flexion to at least 4-/5    Baseline 3+/5 (full ROM against gravity; slight resistance)    Time 4    Period Weeks    Status New    Target Date 01/17/21      OT SHORT TERM GOAL #3   Title Pt will demonstrate ability to thread first sleeve into jacket to improve initiation of task and independence w/ BADLs    Baseline Min A to initiate task    Time 4    Period Weeks    Status New    Target Date 01/17/21             OT Long Term Goals - 12/19/20 1427       OT LONG TERM GOAL #1   Title Pt will demonstrate independence w/ full HEP designed for BUE strengthening, GM/FMC, and coordination by discharge    Baseline No HEP at this time    Time 8    Period Weeks    Status New    Target Date 02/14/21      OT LONG TERM GOAL #2   Title Pt will improve RUE GMC and coordination for improved participation in ADLs as evidenced by increasing Box and Blocks score by at least 5 blocks    Baseline TBA    Time 8    Period Weeks  Status New    Target Date 02/14/21      OT LONG TERM GOAL #3   Title Pt will be able to thread zipper w/ Mod I, using AE prn, in at least 1 attempt    Baseline Unable to thread zipper    Time 8    Period Weeks    Status New    Target Date 02/14/21      OT LONG TERM GOAL #4   Title Pt will be able to complete simulated self-feeding w/ Mod I (scooping, bringing utensil to mouth, cutting food, etc.) using AE prn    Baseline Reports difficulty manipulating utensils    Time 8    Period Weeks    Status New    Target Date 02/14/21      OT LONG TERM GOAL #5   Title Pt will be able to don socks w/ Mod I, using AE prn, in at least 1 attempt by d/c    Baseline Needs assist w/ donning socks    Time 8    Period Weeks    Status New    Target Date 02/14/21             Plan - 12/19/20  1323     Clinical Impression Statement Pt is a 65 y/o female who presents to OP OT due to R-sided weakness and decreased coordination, balance deficits, and limitations w/ executive functioning s/p recent cerebral infarction on 12/01/20. Pt currently lives with her husband in a single-level home and reports increased difficulty w/ ADLs since most recent stroke. Pt was seen for OP OT and ST at this clinic from [place] of this year and has a history of 2 prior strokes in past 18 months. PMH also includes vascular dementia, HTN, loop recorder implant 10/2019, osteoporosis, thrombocytopenia, anemia, and anxiety. Pt will benefit from skilled occupational therapy services to address aforementioned deficits as well as [place], dexterity, altered sensation, balance, safety awareness, introduction of compensatory strategies/AE prn, visual-perception, and implementation of an HEP to improve participation and safety during ADLs.    OT Occupational Profile and History Detailed Assessment- Review of Records and additional review of physical, cognitive, psychosocial history related to current functional performance    Occupational performance deficits (Please refer to evaluation for details): ADL's;IADL's;Rest and Sleep;Leisure;Social Participation    Body Structure / Function / Physical Skills ADL;Decreased knowledge of use of DME;Gait;Strength;Balance;Dexterity;GMC;Tone;Body mechanics;Proprioception;UE functional use;IADL;Endurance;ROM;Vision;Coordination;Mobility;Sensation;FMC    Cognitive Skills Memory;Problem Solve;Safety Awareness;Sequencing    Rehab Potential Fair   Due to comorbidities   Clinical Decision Making Several treatment options, min-mod task modification necessary    Comorbidities Affecting Occupational Performance: Presence of comorbidities impacting occupational performance    Comorbidities impacting occupational performance description: Vascular dementia; anxiety    Modification or Assistance to  Complete Evaluation  Min-Moderate modification of tasks or assist with assess necessary to complete eval    OT Frequency 2x / week    OT Duration 8 weeks    OT Treatment/Interventions Self-care/ADL training;Aquatic Therapy;Electrical Stimulation;Moist Heat;Cryotherapy;Therapeutic exercise;Neuromuscular education;Energy conservation;Manual Therapy;Functional Mobility Training;DME and/or AE instruction;Passive range of motion;Patient/family education;Visual/perceptual remediation/compensation;Cognitive remediation/compensation;Therapeutic activities;Splinting;Balance training    Plan RUE weight-bearing w/ gross grasp or pinch and release incorporated; bilateral shoulder strengthening    Recommended Other Services Currently receiving ST services at this location; PT evaluation scheduled    Consulted and Agree with Plan of Care Patient            Patient will benefit from skilled therapeutic intervention in order to improve  the following deficits and impairments:   Body Structure / Function / Physical Skills: ADL, Decreased knowledge of use of DME, Gait, Strength, Balance, Dexterity, GMC, Tone, Body mechanics, Proprioception, UE functional use, IADL, Endurance, ROM, Vision, Coordination, Mobility, Sensation, [facility] Cognitive Skills: Memory, Problem Solve, Safety Awareness, Sequencing   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 [facility])  Other lack of coordination  Muscle weakness (generalized)  Visuospatial deficit  Frontal lobe and executive function deficit  Other abnormalities of gait and mobility   Problem List Patient Active Problem List   Diagnosis Date Noted   Snoring 12/12/2020   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   Malnutrition of moderate degree 12/07/2020   Stroke (cerebrum) ([place]) 12/01/2020   Dizziness 08/30/2020   History of stroke 07/14/2020   Azotemia 07/11/2020   Anemia 07/11/2020   CVA (cerebral vascular accident) ([place]) 06/28/2020    Cerebral infarction ([place]) 06/25/2020   Acute left-sided weakness 06/24/2020   Vascular dementia with behavior disturbance 10/13/2019   Vascular dementia without behavioral disturbance ([place]) 07/15/2019   Cerebrovascular accident ([place]) 06/25/2019   Brain fog 04/22/2019   History of COVID-19 04/22/2019   Essential hypertension 03/03/2019   Hypertensive urgency 03/03/2019    Kathrine Cords, OTR/L, MSOT 12/20/2020, 11:27 AM  [facility] [place], [name], [address] Phone: [phone]   Fax:  [phone]  Name: Jacqueline Bonilla MRN: [id number] Date of Birth: [birth year]

## 2020-12-20 NOTE — Therapy (Signed)
Jacqueline Bonilla, [name], [address] Phone: [phone]   Fax:  [phone]  Speech Language Pathology Evaluation  Patient Details  Name: Jacqueline Bonilla MRN: [id number] Date of Birth: 02-18-1955 Referring Provider (SLP): Dawayne Patricia   Encounter Date: 12/19/2020   End of Session - 12/20/20 1621     Visit Number 1    Number of Visits 17    Date for SLP Re-Evaluation 02/20/21    SLP Start Time 72    SLP Stop Time  1315    SLP Time Calculation (min) 45 min    Activity Tolerance Patient tolerated treatment well             Past Medical History:  Diagnosis Date   Anemia    Anxiety    Depression    Dysmenorrhea    Endometriosis    Fibroid    Hypertension    Microhematuria    negative workup   Osteoporosis    Tachycardia    Thrombocytopenia ([place])     Past Surgical History:  Procedure Laterality Date   BREAST BIOPSY     BUBBLE STUDY  12/08/2020   Procedure: BUBBLE STUDY;  Surgeon: Josue Hector, MD;  Location: [facility];  Service: Cardiovascular;;   CESAREAN SECTION     hysteroscopic resection     implantable loop recorder implant  10/21/2019   Medtronic Reveal Linq model LNQ 22 ([place] [id number]) implantable loop recorder   IR CT HEAD LTD  12/01/2020   IR PERCUTANEOUS ART THROMBECTOMY/INFUSION INTRACRANIAL INC DIAG ANGIO  12/01/2020   IR US GUIDE VASC ACCESS RIGHT  12/01/2020   RADIOLOGY WITH ANESTHESIA N/A 12/01/2020   Procedure: IR WITH ANESTHESIA - CODE STROKE;  Surgeon: Radiologist, Medication, MD;  Location: [facility];  Service: Radiology;  Laterality: N/A;   TEE WITHOUT CARDIOVERSION N/A 12/08/2020   Procedure: TRANSESOPHAGEAL ECHOCARDIOGRAM (TEE);  Surgeon: Josue Hector, MD;  Location[facility] END[facility]COPY;  Service: Cardiovascular;  Laterality: N/A;    There were no vitals filed for this visit.   Subjective Assessment - 12/19/20 1235     Subjective Pt reports she has been doing  well and is back for evaluation post most recent CVA.    Currently in Pain? No/denies                SLP Evaluation [facility] - 12/19/20 1235       SLP Visit Information   SLP Received On 12/19/20    Referring Provider (SLP) Jethro Bolus PA-C    Onset Date 12/01/20    Medical Diagnosis L CVA      General Information   HPI Jacqueline Bonilla is an 65 y.o. 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 On arrival to the ED the patient was with dense receptive aphasia and mild extensor posturing of her RUE with mild RUE and RLE weakness.   MRI showing scattered small acute infarcts in the left MCA distrubution, subarachnoid hemorrhage overlying the bilateral hemispheres not significantly changed compared to CT and hermorrhage in the left parietal that is also unchanged from CT. She  has been seen for outpatient speech therapy in March and July of this year to address cognitive deficits.      Balance Screen   Has the patient fallen in the past 6 months --   Cannot recall     Prior Functional Status   Cognitive/Linguistic Baseline Baseline deficits    Baseline deficit details Pt was seen for OP and ST services to address mild cognitive deficits in March and July of 2022    Type of Home House     Lives With Spouse    Available Support Family    Education Retired Optometrist    Vocation Retired      Associate Professor   Overall Cognitive Status Impaired/Different from [place] [facility] [place];Safety/judgement;Awareness    Attention Selective;Alternating;Divided    Memory Impaired     Memory Impairment Decreased recall of new information;Decreased short term memory    Awareness Impaired    Awareness Impairment Emergent impairment      Auditory Comprehension   Overall Auditory Comprehension Appears within functional limits for tasks assessed    EffectiveTechniques Extra processing time;Repetition;Visual/Gestural cues      Verbal Expression   Overall Verbal Expression Appears within functional limits for tasks assessed    Other Verbal Expression Comments Reports occasional word finding impairments; none noted this date. Wil cont to monitor      Oral Motor/Sensory Function   Overall Oral Motor/Sensory Function Appears within functional limits for tasks assessed      Standardized Assessments   Standardized Assessments  Cognitive Linguistic Quick Test      Cognitive Linguistic Quick Test (Ages 18-69)   Attention Moderate    Memory Moderate    Executive Function Severe    Language Mild    Visuospatial Skills Severe    Severity Rating Total 9    Composite Severity Rating 8.2                               SLP Short Term Goals - 12/20/20 1709       SLP SHORT TERM GOAL #1   Title Pt will provide 3 areas of challenge for her given min verbal cues to demonstrate increased awareness.    Time 4    Period Weeks    Status New    Target Date 01/17/21      SLP SHORT TERM GOAL #2   Title Pt will recall 3 memory strategies to aid recall of important information with minA.    Time 4    Period Weeks    Status New    Target Date 01/17/21      SLP SHORT TERM GOAL #3   Title Pt will identify errors during structured cognitive task with modA verbal cues.    Time 4    Period Weeks    Status New    Target Date 01/17/21      SLP SHORT TERM GOAL #4   Title Pt will recall 3 word finding strategies to decrease anomia in conversation given minA.    Time 4    Period Weeks    Status New    Target Date 01/17/21              SLP Long Term Goals -  12/20/20 1711       SLP LONG TERM GOAL #1   Title Pt will provide 3 areas of challenge for her independently to demonstrate increased  awareness of deficits.    Time 8    Period Weeks    Status New    Target Date 02/14/21      SLP LONG TERM GOAL #2   Title Patient will demonstrate use of memory strategies to schedule activities, recall weekly events and items to maintain safety to participate socially in functional living environment    Time 8    Period Weeks    Status New    Target Date 02/14/21      SLP LONG TERM GOAL #3   Title Pt will identify errors during structured cognitive task with minA verbal cues.    Time 8    Period Weeks    Status New    Target Date 02/14/21      SLP LONG TERM GOAL #4   Title Pt will demonstrate 2-3 word finding strategies to decrease anomia in conversation independently.    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 Initially, pt reported she is at baseline and has had no changes in her "thinking skills"; however, upon further questioning, pt reported she had trouble remembering information (from earlier in the day).  SLP assessed pt using CLQT this session. Pt scored the following re: Attention "Mod"; Memory "Mod"; Executive Functions "Severe"; Language "Mild"; Visuospatial skills "Severe"; Clock Drawing "Severe". In addition, pt was unoriented to age.  Pt scored a exhibited most difficulty with visuospatial tasks and would benefit from continued assistance and supervision with written/other visual tasks. Pt reports her routine has not changed and her husband is responsible for most iADLs (finances, medication management, cooking). SLP rec skilled ST services to provide assistance with memory and attention strategies to increase independence and efficiency at  home.    Speech Therapy Frequency 2x / week    Duration 8 weeks    Treatment/Interventions Compensatory strategies;Cueing hierarchy;Functional tasks;Patient/family education;Environmental controls;Cognitive reorganization;Multimodal communcation approach;Language facilitation;Compensatory techniques;Internal/external aids;SLP instruction and feedback    Potential to Achieve Goals Fair    Potential Considerations Ability to learn/carryover information    Consulted and Agree with Plan of Care Patient             Patient will benefit from skilled therapeutic intervention in order to improve the following deficits and impairments:   Aphasia  Cognitive communication deficit    Problem List Patient Active Problem List   Diagnosis Date Noted   Snoring 12/12/2020   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   Malnutrition of moderate degree 12/07/2020   Stroke (cerebrum) ([place]) 12/01/2020   Dizziness 08/30/2020   History of stroke 07/14/2020   Azotemia 07/11/2020   Anemia 07/11/2020   CVA (cerebral vascular accident) ([place]) 06/28/2020   Cerebral infarction ([place]) 06/25/2020   Acute left-sided weakness 06/24/2020   Vascular dementia with behavior disturbance 10/13/2019   Vascular dementia without behavioral disturbance ([place]) 07/15/2019   Cerebrovascular accident ([place]) 06/25/2019   Brain fog 04/22/2019   History of COVID-19 04/22/2019   Essential hypertension 03/03/2019   Hypertensive urgency 03/03/2019    [name], CCC-SLP 12/20/2020, 5:18 PM  [place]. [place], [name], [address] Phone: [phone]   Fax:  [phone]  Name: Jacqueline Bonilla MRN: [id number] Date of Birth: March 21, 1955

## 2020-12-20 NOTE — Telephone Encounter (Signed)
°  Please advise:  Patient is having daily nose bleeding. Advised spouse to keep the nasal cavity moist with a smear of Vaseline. She is taking Brilinta daily.    Thank you   Transitional Care Call--who you spoke with Evangelyne Loja (ph [phone]).   Are you/is patient experiencing any problems since coming home? Daily nose bleeding. (A message will be sent to Dr. Letta Pate).  Are there any questions regarding any aspect of care? None. Are there any questions regarding medications administration/dosing? Mr. Streck questions the Cozaar 12.5 MG tab. She has Losartan (Cozaar) 25 MG on hand. (Advised her to take one half the tab for 12.5).  Are meds being taken as prescribed? Yes. Patient should review meds with caller to confirm. Done Have there been any falls? No falls. Has Home Health been to the house and/or have they contacted you? Yes appointment set.  If not, have you tried to contact them? N/A Can we help you contact them?No. Are bowels and bladder emptying properly?Yes.  Are there any unexpected incontinence issues?None.  If applicable, is patient following bowel/bladder programs? N/A Any fevers, problems with breathing, unexpected pain? Yes. Nose bleeds daily.  Are there any skin problems or new areas of breakdown? None. Has the patient/family member arranged specialty MD follow up (ie cardiology/neurology/renal/surgical/etc)? Yes.  Can we help arrange? No Does the patient need any other services or support that we can help arrange? None. Are caregivers following through as expected in assisting the patient?Yes.        11. Has the patient quit smoking, drinking alcohol, or using drugs as recommended? Patient does not take part in such activities.   Appointment Date Danella Sensing NP 12/30/2020 at 9:40 AM. [address] [place]

## 2020-12-20 NOTE — Addendum Note (Signed)
Addended by: [name] on: 12/20/2020 05:21 PM   Modules accepted: Orders

## 2020-12-21 ENCOUNTER — Ambulatory Visit: Payer: Medicare HMO | Admitting: Physical Therapy

## 2020-12-21 ENCOUNTER — Encounter: Payer: Self-pay | Admitting: Occupational Therapy

## 2020-12-21 ENCOUNTER — Ambulatory Visit: Payer: Medicare HMO | Admitting: Speech Pathology

## 2020-12-21 ENCOUNTER — Encounter: Payer: Self-pay | Admitting: Physical Therapy

## 2020-12-21 ENCOUNTER — Ambulatory Visit: Payer: Medicare HMO | Admitting: Occupational Therapy

## 2020-12-21 ENCOUNTER — Other Ambulatory Visit: Payer: Self-pay

## 2020-12-21 DIAGNOSIS — R278 Other lack of coordination: Secondary | ICD-10-CM | POA: Diagnosis not present

## 2020-12-21 DIAGNOSIS — M6281 Muscle weakness (generalized): Secondary | ICD-10-CM

## 2020-12-21 DIAGNOSIS — R2689 Other abnormalities of gait and mobility: Secondary | ICD-10-CM

## 2020-12-21 DIAGNOSIS — R262 Difficulty in walking, not elsewhere classified: Secondary | ICD-10-CM

## 2020-12-21 DIAGNOSIS — I63512 Cerebral infarction due to unspecified occlusion or stenosis of left middle cerebral artery: Secondary | ICD-10-CM

## 2020-12-21 DIAGNOSIS — R4701 Aphasia: Secondary | ICD-10-CM | POA: Diagnosis not present

## 2020-12-21 DIAGNOSIS — R41841 Cognitive communication deficit: Secondary | ICD-10-CM

## 2020-12-21 DIAGNOSIS — R41842 Visuospatial deficit: Secondary | ICD-10-CM

## 2020-12-21 DIAGNOSIS — R41844 Frontal lobe and executive function deficit: Secondary | ICD-10-CM | POA: Diagnosis not present

## 2020-12-21 NOTE — Telephone Encounter (Signed)
I spoke with Mr Jacqueline Bonilla about the nose bleeds "daily" and advised him to call [facility] who is managing the [place]. Advised to keep nose moist with something like saline spray or neosporin or vaseline to avoid mucous membranes from drying out increasing chances of nose bleed. He does not that she has changes in her responses-- "stuporous" is how he described her which is less responsive than when initially discharged. Encouraged him to call [facility] for further advice and direction and to not delay.

## 2020-12-21 NOTE — Therapy (Signed)
[place] [place], [name], [address] Phone: [phone]   Fax:  [phone]  Physical Therapy Treatment  Patient Details  Name: Jacqueline Bonilla MRN: [id number] Date of Birth: Apr 25, 1955 Referring Provider (PT): Sagardia   Encounter Date: 12/21/2020   PT End of Session - 12/21/20 1558     Visit Number 1    Number of Visits 12    Date for PT Re-Evaluation 02/03/21    PT Start Time 6962    PT Stop Time 1611    PT Time Calculation (min) 41 min    Activity Tolerance Patient tolerated treatment well    Behavior During Therapy [facility] for tasks assessed/performed;Flat affect             Past Medical History:  Diagnosis Date   Anemia    Anxiety    Depression    Dysmenorrhea    Endometriosis    Fibroid    Hypertension    Microhematuria    negative workup   Osteoporosis    Tachycardia    Thrombocytopenia ([place])     Past Surgical History:  Procedure Laterality Date   BREAST BIOPSY     BUBBLE STUDY  12/08/2020   Procedure: BUBBLE STUDY;  Surgeon: Josue Hector, MD;  Location: [facility];  Service: Cardiovascular;;   CESAREAN SECTION     hysteroscopic resection     implantable loop recorder implant  10/21/2019   Medtronic Reveal Linq model LNQ 22 ([place] [id number]) implantable loop recorder   IR CT HEAD LTD  12/01/2020   IR PERCUTANEOUS ART THROMBECTOMY/INFUSION INTRACRANIAL INC DIAG ANGIO  12/01/2020   IR US GUIDE VASC ACCESS RIGHT  12/01/2020   RADIOLOGY WITH ANESTHESIA N/A 12/01/2020   Procedure: IR WITH ANESTHESIA - CODE STROKE;  Surgeon: Radiologist, Medication, MD;  Location: [facility];  Service: Radiology;  Laterality: N/A;   TEE WITHOUT CARDIOVERSION N/A 12/08/2020   Procedure: TRANSESOPHAGEAL ECHOCARDIOGRAM (TEE);  Surgeon: Josue Hector, MD;  Location: [facility] ENDOSCOPY;  Service: Cardiovascular;  Laterality: N/A;    There were no vitals filed for this visit.   Subjective Assessment - 12/21/20 1529      Subjective 64 y.o. female presents with L weakness and facial droop. 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  MRI found punctate acute infarct in high L frontal cortex; Additional punctate areas of faint DWI signal in bilateral precentral gyri; Multiple remote cortical infarcts in bilateral frontal, parietal, and occipital lobes and multiple bilateral cerebellar lacunar infarctsPMH includes CVA, vascular dementia, HTN, thrombocytopenia and ILR.  She had a 2 week hosptial stay, she reports that they do not know why she is having these strokes.    Pertinent History prior CVA affecting R side, HTN    Patient Stated Goals be more mobile, improve balance,  increase independence    Currently in Pain? No/denies                [facility] PT Assessment - 12/21/20 0001       Assessment   Medical Diagnosis L MCA stroke    Referring Provider (PT) Sagardia    Onset Date/Surgical Date 12/01/20    Hand Dominance Right    Next MD Visit 12/30/20 w/ physiatry    Prior Therapy this past summer after the second CVA      Precautions   Precautions None      Balance Screen   Has the patient fallen in  the past 6 months No    Has the patient had a decrease in activity level because of a fear of falling?  No    Is the patient reluctant to leave their home because of a fear of falling?  No      Home Environment   Additional Comments husband built ramp to get into house; no stairs in house, was doing some cooking and cleaning      Prior Function   Level of Independence Independent with basic ADLs;Independent with household mobility without device;Independent with community mobility without device    Vocation Retired    Dietitian    Leisure going on walks, exercise      Posture/Postural Control   Posture Comments fwd head, rounded shoulders      ROM / Strength   AROM / PROM / Strength AROM;Strength      AROM   Overall  AROM Comments LE WFL's, some processing issues when asked to do she would take a few seconds prior to doing the motion      Strength   Overall Strength Comments Hips, knees and ankles 4-/5      Transfers   Comments when turning or transferring tends to take short choppy steps      Ambulation/Gait   Gait Comments no device, right arm is in a guarded posistion with minimal arm swing, right LE seems to be ataxic  mild      Standardized Balance Assessment   Standardized Balance Assessment Timed Up and Go Test      [facility] Balance Test   Sit to Stand Able to stand without using hands and stabilize independently    Standing Unsupported Able to stand safely 2 minutes    Sitting with Back Unsupported but Feet Supported on Floor or Stool Able to sit safely and securely 2 minutes    Stand to Sit Controls descent by using hands    Transfers Able to transfer safely, minor use of hands    Standing Unsupported with Eyes Closed Able to stand 10 seconds with supervision    Standing Unsupported with Feet Together Able to place feet together independently and stand for 1 minute with supervision    From Standing, Reach Forward with Outstretched Arm Can reach forward >12 cm safely (5")    From Standing Position, Pick up Object from Floor Able to pick up shoe, needs supervision    From Standing Position, Turn to Look Behind Over each Shoulder Looks behind one side only/other side shows less weight shift    Turn 360 Degrees Able to turn 360 degrees safely but slowly    Standing Unsupported, Alternately Place Feet on Step/Stool Able to stand independently and complete 8 steps >20 seconds    Standing Unsupported, One Foot in Front Able to take small step independently and hold 30 seconds    Standing on One Leg Tries to lift leg/unable to hold 3 seconds but remains standing independently    Total Score 42      Timed Up and Go Test   Normal TUG (seconds) 16    Manual TUG (seconds) 18                                       PT Short Term Goals - 12/21/20 1611       PT SHORT TERM GOAL #1   Title Pt will be I  with initial HEP    Time 2    Period Weeks    Status New               PT Long Term Goals - 12/21/20 1612       PT LONG TERM GOAL #1   Title Pt will demo improved [place] Balance score to 52/56 to demo decreased fall risk    Baseline 42/56    Time 12    Period Weeks    Status New      PT LONG TERM GOAL #2   Title Pt will demo 5x STS <15 seconds with no UE assist    Baseline 21 seconds with occasional UE assist and LEs intermittently locking on back of chair    Time 12    Status New      PT LONG TERM GOAL #3   Title Pt will demo gait >1000 ft over level/unlevel surfaces with no AD and supervision assist    Time 12    Period Weeks    Status New      PT LONG TERM GOAL #4   Title Pt will report able to safely return to gym routine at [facility]    Time 12    Period Weeks    Status New      PT LONG TERM GOAL #5   Title incresae LE strength to 4+/5    Time 12    Period Weeks    Status New                   Plan - 12/21/20 1601     Clinical Impression Statement Patient has suffered her 3rd stroke recently, had a 2 week hospital stay, we saw her this summer into the fall and in October she scored a 54/56 on the [place] balance test, today she scored a 42/56.  She tends to take short shuffling steps when transferring or with some turns, she denies any falls, she does not use an assistive device, she does not swing the right arm, has some ataxic movements of the right LE with walking, she will have the small stutter steps when I aske her to change directions, she struggled with the dynamic surface standing and with eyes closed standing, LE strength was 4-/5.  Tug time was 16 seconds and manual Tug was 18 seconds.    Stability/Clinical Decision Making Evolving/Moderate complexity    Clinical Decision Making Moderate    Rehab Potential  Good    PT Frequency 2x / week    PT Duration 12 weeks    PT Treatment/Interventions ADLs/Self Care Home Management;Gait training;Stair training;Functional mobility training;Therapeutic activities;Therapeutic exercise;Balance training;Neuromuscular re-education;Manual techniques;Patient/family education    PT Next Visit Plan work on balance, functional gait, safety, strength    Consulted and Agree with Plan of Care Patient             Patient will benefit from skilled therapeutic intervention in order to improve the following deficits and impairments:  Difficulty walking, Abnormal gait, Impaired UE functional use, Decreased endurance, Cardiopulmonary status limiting activity, Decreased activity tolerance, Impaired vision/preception, Decreased balance, Postural dysfunction, Decreased strength, Decreased mobility  Visit Diagnosis: Other lack of coordination - Plan: PT plan of care cert/re-cert  Muscle weakness (generalized) - Plan: PT plan of care cert/re-cert  Balance problem - Plan: PT plan of care cert/re-cert  Difficulty in walking, not elsewhere classified - Plan: PT plan of care cert/re-cert     Problem List Patient Active  Problem List   Diagnosis Date Noted   Snoring 12/12/2020   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   Malnutrition of moderate degree 12/07/2020   Stroke (cerebrum) ([place]) 12/01/2020   Dizziness 08/30/2020   History of stroke 07/14/2020   Azotemia 07/11/2020   Anemia 07/11/2020   CVA (cerebral vascular accident) ([place]) 06/28/2020   Cerebral infarction ([place]) 06/25/2020   Acute left-sided weakness 06/24/2020   Vascular dementia with behavior disturbance 10/13/2019   Vascular dementia without behavioral disturbance ([place]) 07/15/2019   Cerebrovascular accident ([place]) 06/25/2019   Brain fog 04/22/2019   History of COVID-19 04/22/2019   Essential hypertension 03/03/2019   Hypertensive urgency 03/03/2019    Sumner Boast, PT 12/21/2020,  4:17 PM  [place]. [place], [name], [address] Phone: [phone]   Fax:  [phone]  Name: Jacqueline Bonilla MRN: [id number] Date of Birth: Apr 24, 1955

## 2020-12-21 NOTE — Patient Instructions (Signed)
Coordination Exercises  Perform the following exercises for 15 minutes 2 times per day. Perform with right hand. Slow down and focus on coordination.  Flipping Cards: place deck of cards on the table. Flip cards over by moving your fingers to rotate the card face up using only your fingers.  Rotating Cards: flip a card clockwise and counterclockwise between your fingers using only your fingers. If this gets easy you can also try with with a pen.  Ball Toss: using a small to [employer] ball, toss up and catch using both hands. As that gets easier, try either increasing the height of your tosses or decreasing the size of the ball.

## 2020-12-21 NOTE — Telephone Encounter (Signed)
Attempted to call patient back  twice to give Dr. Sherre Poot reply. I am not able to leave a voicemail or talk to anyone.

## 2020-12-21 NOTE — Therapy (Signed)
[place] [place], [name], [address] Phone: [phone]   Fax:  [phone]  Occupational Therapy Treatment  Patient Details  Name: Jacqueline Bonilla MRN: [id number] Date of Birth: [birth year] Referring Provider (OT): Lauraine Rinne, PA-C   Encounter Date: 12/21/2020   OT End of Session - 12/21/20 1450     Visit Number 2    Number of Visits 17    Date for OT Re-Evaluation 03/20/21    Authorization Type [employer]    Authorization Time Period VL: TBD    Progress Note Due on Visit 10    OT Start Time 1445    OT Stop Time 1525    OT Time Calculation (min) 40 min    Activity Tolerance Patient tolerated treatment well    Behavior During Therapy [facility] for tasks assessed/performed;Flat affect            Past Medical History:  Diagnosis Date   Anemia    Anxiety    Depression    Dysmenorrhea    Endometriosis    Fibroid    Hypertension    Microhematuria    negative workup   Osteoporosis    Tachycardia    Thrombocytopenia ([place])     Past Surgical History:  Procedure Laterality Date   BREAST BIOPSY     BUBBLE STUDY  12/08/2020   Procedure: BUBBLE STUDY;  Surgeon: Josue Hector, MD;  Location: [place];  Service: Cardiovascular;;   CESAREAN SECTION     hysteroscopic resection     implantable loop recorder implant  10/21/2019   Medtronic Reveal Linq model LNQ 22 ([place] [id number]) implantable loop recorder   IR CT HEAD LTD  12/01/2020   IR PERCUTANEOUS ART THROMBECTOMY/INFUSION INTRACRANIAL INC DIAG ANGIO  12/01/2020   IR US GUIDE VASC ACCESS RIGHT  12/01/2020   RADIOLOGY WITH ANESTHESIA N/A 12/01/2020   Procedure: IR WITH ANESTHESIA - CODE STROKE;  Surgeon: Radiologist, Medication, MD;  Location: [place];  Service: Radiology;  Laterality: N/A;   TEE WITHOUT CARDIOVERSION N/A 12/08/2020   Procedure: TRANSESOPHAGEAL ECHOCARDIOGRAM (TEE);  Surgeon: Josue Hector, MD;  Location: [facility] ENDOSCOPY;  Service:  Cardiovascular;  Laterality: N/A;    There were no vitals filed for this visit.   Subjective Assessment - 12/21/20 1448     Subjective  Pt reports she/her husband have had a hard time getting her medications filled correctly from the pharmacy    Pertinent History Cerebral infarction 12/01/20; MRI indicated small acute infarcts in L MCA distribution and SAH overlying bilateral cerebral hemispheres. PMH includes 2 prior strokes in June 2021 (R-sided weakness) and June 2022 (L-sided weakness); vascular dementia; HTN; loop recorder implant 10/2019; osteoporosis; thrombocytopenia; anxiety; anemia    Patient Stated Goals "Anything I can to get better"    Currently in Pain? No/denies             Treatment/Exercises - 12/21/20    Coordination Exercises  Pt completed coordination activities w/ each RUE or BUEs as appropriate, including: rotating small ball w/ fingertips; tossing/catching ball w/ both hands; flipping cards off a deck; rotating cards in-hand/turning cards end-over end; and pushing cards off deck using thumb. Pt required demonstration of exercises and verbal cues to facilitate dexterity, exhibiting most difficulty attempting to rotate ball in fingertips and pushing cards off a deck using thumb only. Most success w/ least amount of cueing for flipping cards, rotating card between fingers, and ball toss and catch activities.  Weight Bearing Attempted light weight bearing while standing at countertop through [place] for increased proprioceptive input and facilitation of muscle activation. Pt required initial multimodal cues (tactile/visual/verbal) for understanding of exercise and demonstrated good alignment, but reported she was unable to "feeling anything"  Activity was modified to wt bearing on R elbow/forearm while sitting EOM then pushing up through UE to return to sitting at midline; pt required Min A for maintaining hand position on mat during exercise. OT provided education and cueing  throughout to facilitate understanding of exercise and decrease compensatory movement patterns.    Shoulder Strengthening Single arm R shoulder flexion against anchored resistance band (yellow) 15x. Pt required max verbal cues to decrease compensatory elbow flexion w/ OT attempting to incorporate visual attention to facilitate success, but pt was unable to complete exercise while looking at elbow.             OT Short Term Goals - 12/21/20 1451       OT SHORT TERM GOAL #1   Title Pt will improve participation in manipulation of clothing fasteners as evidenced by completing 9-Hole Peg Test w/ less than 5 drops using R, dominant hand    Baseline R hand 2 min, 20 sec w/ 7 drops    Time 4    Period Weeks    Status On-going    Target Date 01/17/21      OT SHORT TERM GOAL #2   Title Pt will improve safety w/ functional overhead reach by increasing strength of R shoulder flexion to at least 4-/5    Baseline 3+/5 (full ROM against gravity; slight resistance)    Time 4    Period Weeks    Status On-going    Target Date 01/17/21      OT SHORT TERM GOAL #3   Title Pt will demonstrate ability to thread first sleeve into jacket to improve initiation of task and independence w/ BADLs    Baseline Min A to initiate task    Time 4    Period Weeks    Status On-going    Target Date 01/17/21             OT Long Term Goals - 12/21/20 1451       OT LONG TERM GOAL #1   Title Pt will demonstrate independence w/ full HEP designed for BUE strengthening, GM/FMC, and coordination by discharge    Baseline No HEP at this time    Time 8    Period Weeks    Status On-going    Target Date 02/14/21      OT LONG TERM GOAL #2   Title Pt will improve RUE GMC and coordination for improved participation in ADLs as evidenced by increasing Box and Blocks score by at least 5 blocks    Baseline TBA    Time 8    Period Weeks    Status On-going    Target Date 02/14/21      OT LONG TERM GOAL #3   Title  Pt will be able to thread zipper w/ Mod I, using AE prn, in at least 1 attempt    Baseline Unable to thread zipper    Time 8    Period Weeks    Status On-going    Target Date 02/14/21      OT LONG TERM GOAL #4   Title Pt will be able to complete simulated self-feeding w/ Mod I (scooping, bringing utensil to mouth, cutting food, etc.) using AE prn  Baseline Reports difficulty manipulating utensils    Time 8    Period Weeks    Status On-going    Target Date 02/14/21      OT LONG TERM GOAL #5   Title Pt will be able to don socks w/ Mod I, using AE prn, in at least 1 attempt by d/c    Baseline Needs assist w/ donning socks    Time 8    Period Weeks    Status On-going    Target Date 02/14/21             Plan - 12/21/20 1452     Clinical Impression Statement OT introduced coordination activities this session w/ pt requiring demonstrating, cueing, and repetition for success. Most successful activities were included in HEP, see 'Patient Instructions.' Very small isolated finger movements, particularly when involving rotation appeared most difficult for pt this session. To incorporate somatosensory input through RUE for NMR, OT also introduced weight bearing exercises. Pt reported she could not tell that she was getting input through her RUE w/ OT explaining more in depth that wt bearing would not instantly improve sensation/awareness of R hand, but was a method of facilitating NMR; pt verbalized understanding and exercises were more successful. Pt benefits from clear demonstration of exercises and additional facilitation to decrease compensatory movement patterns due to decreased awareness and problem-solving.    OT Occupational Profile and History Detailed Assessment- Review of Records and additional review of physical, cognitive, psychosocial history related to current functional performance    Occupational performance deficits (Please refer to evaluation for details): ADL's;IADL's;Rest  and Sleep;Leisure;Social Participation    Body Structure / Function / Physical Skills ADL;Decreased knowledge of use of DME;Gait;Strength;Balance;Dexterity;GMC;Tone;Body mechanics;Proprioception;UE functional use;IADL;Endurance;ROM;Vision;Coordination;Mobility;Sensation;FMC    Cognitive Skills Memory;Problem Solve;Safety Awareness;Sequencing    Rehab Potential Fair   Due to comorbidities   Clinical Decision Making Several treatment options, min-mod task modification necessary    Comorbidities Affecting Occupational Performance: Presence of comorbidities impacting occupational performance    Comorbidities impacting occupational performance description: Vascular dementia; anxiety    Modification or Assistance to Complete Evaluation  Min-Moderate modification of tasks or assist with assess necessary to complete eval    OT Frequency 2x / week    OT Duration 8 weeks    OT Treatment/Interventions Self-care/ADL training;Aquatic Therapy;Electrical Stimulation;Moist Heat;Cryotherapy;Therapeutic exercise;Neuromuscular education;Energy conservation;Manual Therapy;Functional Mobility Training;DME and/or AE instruction;Passive range of motion;Patient/family education;Visual/perceptual remediation/compensation;Cognitive remediation/compensation;Therapeutic activities;Splinting;Balance training    Plan Closed-chain shoulder strengthening w/ facilitation to decrease elbow flexion; gross pinch and release activities   Recommended Other Services Currently receiving ST and PT services at this location   Consulted and Agree with Plan of Care Patient            Patient will benefit from skilled therapeutic intervention in order to improve the following deficits and impairments:   Body Structure / Function / Physical Skills: ADL, Decreased knowledge of use of DME, Gait, Strength, Balance, Dexterity, GMC, Tone, Body mechanics, Proprioception, UE functional use, IADL, Endurance, ROM, Vision, Coordination, Mobility,  Sensation, [facility] Cognitive Skills: Memory, Problem Solve, Safety Awareness, Sequencing   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 [facility])  Other lack of coordination  Muscle weakness (generalized)  Visuospatial deficit  Frontal lobe and executive function deficit  Other abnormalities of gait and mobility   Problem List Patient Active Problem List   Diagnosis Date Noted   Snoring 12/12/2020   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   Malnutrition of moderate degree 12/07/2020  Stroke (cerebrum) ([place]) 12/01/2020   Dizziness 08/30/2020   History of stroke 07/14/2020   Azotemia 07/11/2020   Anemia 07/11/2020   CVA (cerebral vascular accident) ([place]) 06/28/2020   Cerebral infarction ([place]) 06/25/2020   Acute left-sided weakness 06/24/2020   Vascular dementia with behavior disturbance 10/13/2019   Vascular dementia without behavioral disturbance ([place]) 07/15/2019   Cerebrovascular accident ([place]) 06/25/2019   Brain fog 04/22/2019   History of COVID-19 04/22/2019   Essential hypertension 03/03/2019   Hypertensive urgency 03/03/2019    Kathrine Cords, OTR/L, MSOT 12/21/2020, 2:56 PM  [place] [place] [employer], [name], [address] Phone: [phone]   Fax:  [phone]  Name: Jacqueline Bonilla MRN: [id number] Date of Birth: May 23, 1955

## 2020-12-21 NOTE — Therapy (Signed)
[place] [place], [name], [address] Phone: [phone]   Fax:  [phone]  Speech Language Pathology Treatment  Patient Details  Name: Jacqueline Bonilla MRN: [id number] Date of Birth: Nov 17, 1955 Referring Provider (SLP): Dawayne Patricia   Encounter Date: 12/21/2020   End of Session - 12/21/20 1449     Visit Number 2    Number of Visits 17    Date for SLP Re-Evaluation 02/20/21    SLP Start Time 71    SLP Stop Time  1440    SLP Time Calculation (min) 40 min    Activity Tolerance Patient tolerated treatment well             Past Medical History:  Diagnosis Date   Anemia    Anxiety    Depression    Dysmenorrhea    Endometriosis    Fibroid    Hypertension    Microhematuria    negative workup   Osteoporosis    Tachycardia    Thrombocytopenia ([place])     Past Surgical History:  Procedure Laterality Date   BREAST BIOPSY     BUBBLE STUDY  12/08/2020   Procedure: BUBBLE STUDY;  Surgeon: Josue Hector, MD;  Location: [place];  Service: Cardiovascular;;   CESAREAN SECTION     hysteroscopic resection     implantable loop recorder implant  10/21/2019   Medtronic Reveal Linq model LNQ 22 ([place] [id number]) implantable loop recorder   IR CT HEAD LTD  12/01/2020   IR PERCUTANEOUS ART THROMBECTOMY/INFUSION INTRACRANIAL INC DIAG ANGIO  12/01/2020   IR US GUIDE VASC ACCESS RIGHT  12/01/2020   RADIOLOGY WITH ANESTHESIA N/A 12/01/2020   Procedure: IR WITH ANESTHESIA - CODE STROKE;  Surgeon: Radiologist, Medication, MD;  Location: [place];  Service: Radiology;  Laterality: N/A;   TEE WITHOUT CARDIOVERSION N/A 12/08/2020   Procedure: TRANSESOPHAGEAL ECHOCARDIOGRAM (TEE);  Surgeon: Josue Hector, MD;  Location: [facility] ENDOSCOPY;  Service: Cardiovascular;  Laterality: N/A;    There were no vitals filed for this visit.   Subjective Assessment - 12/21/20 1406     Subjective Pt reports she is going to  [facility] to visit her mom.    Currently in Pain? No/denies                   ADULT SLP TREATMENT - 12/21/20 1709       General Information   Behavior/Cognition Alert;Cooperative;Pleasant mood      Treatment Provided   Treatment provided Cognitive-Linquistic      Cognitive-Linquistic Treatment   Treatment focused on Cognition    Skilled Treatment Discussed implementing memory journal/memory book to assist with recall of important information. Discussed app where pt can voice record into a journal entry. Pt to ask husband for assistance with set up. Pt was unable to navigate phone (to address phone use for safety) to use app. pt was able to type on computer vs. writing information down, so this may be a better avenue for journaling. Attempted to discuss goals for pt. Pt originally shared with me that she is responsible for her own medications, then later on contradicted herself and shared that [name] (husband) gives them to her in a cup. SLP recasted information to her to see if she was able to self-correct her error. Pt did not demonstrate any awareness of error. To give questionnaire to guide treatment.      Assessment / Recommendations / Plan   Plan Continue  with current plan of care      Progression Toward Goals   Progression toward goals Progressing toward goals                SLP Short Term Goals - 12/20/20 1709       SLP SHORT TERM GOAL #1   Title Pt will provide 3 areas of challenge for her given min verbal cues to demonstrate increased awareness.    Time 4    Period Weeks    Status New    Target Date 01/17/21      SLP SHORT TERM GOAL #2   Title Pt will recall 3 memory strategies to aid recall of important information with minA.    Time 4    Period Weeks    Status New    Target Date 01/17/21      SLP SHORT TERM GOAL #3   Title Pt will identify errors during structured cognitive task with modA verbal cues.    Time 4    Period Weeks    Status New     Target Date 01/17/21      SLP SHORT TERM GOAL #4   Title Pt will recall 3 word finding strategies to decrease anomia in conversation given minA.    Time 4    Period Weeks    Status New    Target Date 01/17/21              SLP Long Term Goals - 12/20/20 1711       SLP LONG TERM GOAL #1   Title Pt will provide 3 areas of challenge for her independently to demonstrate increased awareness of deficits.    Time 8    Period Weeks    Status New    Target Date 02/14/21      SLP LONG TERM GOAL #2   Title Patient will demonstrate use of memory strategies to schedule activities, recall weekly events and items to maintain safety to participate socially in functional living environment    Time 8    Period Weeks    Status New    Target Date 02/14/21      SLP LONG TERM GOAL #3   Title Pt will identify errors during structured cognitive task with minA verbal cues.    Time 8    Period Weeks    Status New    Target Date 02/14/21      SLP LONG TERM GOAL #4   Title Pt will demonstrate 2-3 word finding strategies to decrease anomia in conversation independently.    Time 8    Period Weeks    Status New    Target Date 02/14/21              Plan - 12/21/20 1707     Clinical Impression Statement See tx note. SLP rec skilled ST services to provide assistance with memory and attention strategies to increase independence and efficiency at home.    Speech Therapy Frequency 2x / week    Duration 8 weeks    Treatment/Interventions Compensatory strategies;Cueing hierarchy;Functional tasks;Patient/family education;Environmental controls;Cognitive reorganization;Multimodal communcation approach;Language facilitation;Compensatory techniques;Internal/external aids;SLP instruction and feedback    Potential to Achieve Goals Fair    Potential Considerations Ability to learn/carryover information    Consulted and Agree with Plan of Care Patient             Patient will benefit from skilled  therapeutic intervention in order to improve the following deficits and impairments:  Cognitive communication deficit    Problem List Patient Active Problem List   Diagnosis Date Noted   Snoring 12/12/2020   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   Malnutrition of moderate degree 12/07/2020   Stroke (cerebrum) ([place]) 12/01/2020   Dizziness 08/30/2020   History of stroke 07/14/2020   Azotemia 07/11/2020   Anemia 07/11/2020   CVA (cerebral vascular accident) ([place]) 06/28/2020   Cerebral infarction ([place]) 06/25/2020   Acute left-sided weakness 06/24/2020   Vascular dementia with behavior disturbance 10/13/2019   Vascular dementia without behavioral disturbance ([place]) 07/15/2019   Cerebrovascular accident ([place]) 06/25/2019   Brain fog 04/22/2019   History of COVID-19 04/22/2019   Essential hypertension 03/03/2019   Hypertensive urgency 03/03/2019    [name], CCC-SLP 12/21/2020, 5:17 PM  [place]. [place], [name], [address] Phone: [phone]   Fax:  [phone]   Name: Jacqueline Bonilla MRN: [id number] Date of Birth: [birth year]

## 2020-12-23 NOTE — Discharge Summary (Addendum)
Stroke Discharge Summary  Patient ID: junko ohagan   MRN: [id number]      DOB: October 10, 1955  Date of Admission: 12/01/2020 Date of Discharge: 12/23/2020  Attending Physician:  No att. providers found, Stroke MD Consultant(s):   None Patient's PCP:  Horald Pollen, MD  Discharge Diagnoses:  left distal M2/M3 occlusion s/p TNK and IR, complicated by small SAH with TNK reversal with TXA, etiology recurrent cryptogenic stroke with negative prior work-up Principal Problem:   Stroke (cerebrum) ([place]) Active Problems:   Malnutrition of moderate degree Cryptogenic stroke Hyperlipidimia Small sub arachnoid hemorrhage- post procedure   Medications to be continued on Rehab Allergies as of 12/08/2020       Reactions   Avelox [moxifloxacin]    Ciprofloxacin Hives   Floraquin [iodoquinol]    No cipro or others   Iohexol     Code: HIVES, Desc: HIVES S/P IVP MANY YRS AGO   Levaquin [levofloxacin]    Plavix [clopidogrel] Diarrhea   Quinolones    Shellfish Allergy Itching   Itching under the chin. Described by patient but not seen by husband.        Medication List     ASK your doctor about these medications    aspirin EC 81 MG tablet Take 81 mg by mouth daily. Swallow whole.   multivitamin with minerals Tabs tablet Take 1 tablet by mouth daily.   QC TUMERIC COMPLEX PO Take 1 tablet by mouth daily.   ZINC PO Take 1 tablet by mouth daily.        LABORATORY STUDIES CBC    Component Value Date/Time   WBC 5.4 12/09/2020 0508   RBC 3.49 (L) 12/09/2020 0508   HGB 10.6 (L) 12/09/2020 0508   HGB 13.3 06/11/2019 1431   HGB 12.4 08/04/2013 1620   HCT 31.6 (L) 12/09/2020 0508   HCT 40.2 06/11/2019 1431   PLT 230 12/09/2020 0508   PLT 160 06/11/2019 1431   MCV 90.5 12/09/2020 0508   MCV 93.7 07/20/2019 0950   MCV 91 06/11/2019 1431   MCH 30.4 12/09/2020 0508   MCHC 33.5 12/09/2020 0508   RDW 12.7 12/09/2020 0508   RDW 12.0 06/11/2019 1431   LYMPHSABS 1.2  12/09/2020 0508   LYMPHSABS 1.3 06/11/2019 1431   MONOABS 0.7 12/09/2020 0508   EOSABS 0.2 12/09/2020 0508   EOSABS 0.1 06/11/2019 1431   BASOSABS 0.1 12/09/2020 0508   BASOSABS 0.1 06/11/2019 1431   CMP    Component Value Date/Time   NA 135 12/09/2020 0508   NA 137 07/20/2019 1058   K 4.0 12/09/2020 0508   CL 100 12/09/2020 0508   CO2 26 12/09/2020 0508   GLUCOSE 103 (H) 12/09/2020 0508   BUN 20 12/09/2020 0508   BUN 20 07/20/2019 1058   CREATININE 0.89 12/09/2020 0508   CREATININE 0.91 07/11/2015 1517   CALCIUM 8.8 (L) 12/09/2020 0508   PROT 6.3 (L) 12/09/2020 0508   PROT 6.6 07/20/2019 1058   ALBUMIN 3.1 (L) 12/09/2020 0508   ALBUMIN 4.2 07/20/2019 1058   AST 18 12/09/2020 0508   ALT 14 12/09/2020 0508   ALKPHOS 50 12/09/2020 0508   BILITOT 0.6 12/09/2020 0508   BILITOT 0.4 07/20/2019 1058   GFRNONAA >60 12/09/2020 0508   GFRNONAA 68 07/11/2015 1517   GFRAA 78 07/20/2019 1058   GFRAA 78 07/11/2015 1517   COAGS Lab Results  Component Value Date   INR 1.0 12/01/2020   INR 1.0 06/24/2020  Lipid Panel    Component Value Date/Time   CHOL 153 12/02/2020 0440   CHOL 193 09/08/2020 0813   TRIG 81 12/02/2020 0440   HDL 48 12/02/2020 0440   HDL 52 09/08/2020 0813   CHOLHDL 3.2 12/02/2020 0440   VLDL 16 12/02/2020 0440   LDLCALC 89 12/02/2020 0440   LDLCALC 127 (H) 09/08/2020 0813   HgbA1C  Lab Results  Component Value Date   HGBA1C 5.7 (H) 12/02/2020   Urinalysis    Component Value Date/Time   APPEARANCEUR Clear 08/08/2017 0915   GLUCOSEU Negative 08/08/2017 0915   BILIRUBINUR negative 07/20/2019 0939   BILIRUBINUR Negative 08/08/2017 0915   KETONESUR negative 07/20/2019 0939   PROTEINUR negative 07/20/2019 0939   PROTEINUR Negative 08/08/2017 0915   UROBILINOGEN 0.2 07/20/2019 0939   NITRITE Negative 07/20/2019 0939   NITRITE Negative 08/08/2017 0915   LEUKOCYTESUR Small (1+) (A) 07/20/2019 0939   LEUKOCYTESUR Negative 08/08/2017 0915   Urine  Drug Screen No results found for: LABOPIA, COCAINSCRNUR, LABBENZ, AMPHETMU, THCU, LABBARB  Alcohol Level No results found for: [facility]   SIGNIFICANT DIAGNOSTIC STUDIES DG Chest 1 View  Result Date: 12/02/2020 CLINICAL DATA:  Stroke EXAM: CHEST  1 VIEW COMPARISON:  July 20, 2019, October 20, 2020 FINDINGS: Cardiomediastinal silhouette is mildly increased in prominence along the aortic arch, most likely due to a combination of rotation and AP technique. No pleural effusion or pneumothorax. No acute pleuroparenchymal abnormality. Gaseous distension of bowel beneath the LEFT hemidiaphragm. IMPRESSION: 1. Cardiomediastinal silhouette is mildly increased in prominence along the aortic arch, most likely due to a combination of rotation and AP technique. If concern for aortic pathology, recommend dedicated PA and lateral chest radiograph. 2. Otherwise no acute cardiopulmonary abnormality. Electronically Signed   By: Valentino Saxon M.D.   On: 12/02/2020 07:48   CT HEAD WO CONTRAST (5MM)  Result Date: 12/02/2020 CLINICAL DATA:  Follow-up after neurovascular intervention. EXAM: CT HEAD WITHOUT CONTRAST TECHNIQUE: Contiguous axial images were obtained from the base of the skull through the vertex without intravenous contrast. COMPARISON:  12/01/2020 FINDINGS: Brain: Allowing for redistribution, unchanged subarachnoid hyperdensity over both hemispheres, left-greater-than-right. A suspected intraparenchymal component in the left parietal lobe is unchanged. There is periventricular hypoattenuation compatible with chronic microvascular disease. Generalized volume loss. No hydrocephalus. Vascular: No abnormal hyperdensity of the major intracranial arteries or dural venous sinuses. No intracranial atherosclerosis. Skull: The visualized skull base, calvarium and extracranial soft tissues are normal. Sinuses/Orbits: No fluid levels or advanced mucosal thickening of the visualized paranasal sinuses. No mastoid or middle ear  effusion. The orbits are normal. IMPRESSION: 1. Unchanged subarachnoid hyperdensity over both hemispheres, left-greater-than-right, likely a combination contrast extravasation and subarachnoid blood. 2. Unchanged subcortical intraparenchymal blood in the left parietal lobe. Electronically Signed   By: Ulyses Jarred M.D.   On: 12/02/2020 03:36   CT HEAD WO CONTRAST (5MM)  Result Date: 12/01/2020 CLINICAL DATA:  Postop left MCA thrombectomy. Stroke. Follow-up subarachnoid hemorrhage EXAM: CT HEAD WITHOUT CONTRAST TECHNIQUE: Contiguous axial images were obtained from the base of the skull through the vertex without intravenous contrast. COMPARISON:  CT head earlier today 12/01/2020 FINDINGS: Brain: Subarachnoid hemorrhage and possible subarachnoid contrast on the left is unchanged from earlier today. This is in the sylvian fissure and in the left parietal sulci. Small amount of hemorrhage in the interpeduncular cistern unchanged. Minimal subarachnoid hemorrhage right parietal lobe unchanged. Negative for hydrocephalus. Generalized atrophy. Chronic microvascular ischemic change in the white matter. No acute cortical infarct. Vascular:  Negative for hyperdense vessel Skull: Negative Sinuses/Orbits: Paranasal sinuses clear.  Negative orbit Other: None IMPRESSION: 1. Acute subarachnoid hemorrhage, stable from earlier today. No hydrocephalus or new hemorrhage. 2. No acute ischemic infarct 3. Atrophy and chronic microvascular ischemic change in Electronically Signed   By: Franchot Gallo M.D.   On: 12/01/2020 18:06   CT HEAD WO CONTRAST  Result Date: 12/01/2020 CLINICAL DATA:  Stroke, follow up. Stroke s/p left M3/MCA thrombectomy with trace SAH vs contrast extravasation. Evaluate for hemorrhage progression compared to post procedural flat panel CT. EXAM: CT HEAD WITHOUT CONTRAST TECHNIQUE: Contiguous axial images were obtained from the base of the skull through the vertex without intravenous contrast. COMPARISON:   Flat panel head CT 12/01/2020. And head CT December 01, 2020 at 9:59 a.m. FINDINGS: Brain: There has been interval increase of the hyperdensity in the left sylvian fissure, now extending to the cortical sulci in the left parietal lobe, consistent with subarachnoid hemorrhage. Small amount of blood is also seen in the interpeduncular cistern. There is no hydrocephalus. No large acute territorial infarct identified. Chronic scattered cortical infarcts are unchanged from prior CT. Vascular: Hyperdense vessels are related to recent contrast administration. Skull: Normal. Negative for fracture or focal lesion. Sinuses/Orbits: No acute finding. Other: None. IMPRESSION: Interval increase in size of the left sylvian subarachnoid hemorrhage, now extending to the posterior parietal cortical sulci with small amount of blood in the interpeduncular cistern. No hydrocephalus. These results were discussed by telephone at the time of interpretation on 12/01/2020 at 3:09 pm to provider Dr. Erlinda Hong, who verbally acknowledged these results. Electronically Signed   By: Pedro Earls M.D.   On: 12/01/2020 15:09   MR ANGIO HEAD WO CONTRAST  Result Date: 12/02/2020 CLINICAL DATA:  Acute onset of aphasia, history of strokes EXAM: MRI HEAD WITHOUT CONTRAST MRA HEAD WITHOUT CONTRAST TECHNIQUE: Multiplanar, multi-echo pulse sequences of the brain and surrounding structures were acquired without intravenous contrast. Angiographic images of the Circle of Willis were acquired using MRA technique without intravenous contrast. COMPARISON:  Same-day noncontrast head CT, brain MRI 06/25/2018 FINDINGS: MRI HEAD FINDINGS Brain: There are scattered small areas of diffusion restriction throughout the left MCA distribution with involvement of the insular cortex, anterior frontal lobe and parietal lobe cortex, and corona radiata, consistent with evolving acute infarcts. Additional diffusion restriction with corresponding SWI signal dropout in  the left parietal lobe superiorly is likely due to intraparenchymal blood as seen on prior CT. Diffusion restriction in the left temporal lobe is also likely at least in part due to blood products. Sulcal FLAIR hyperintensity and associated SWI signal dropout overlying the left cerebral hemisphere and sylvian fissure as well as the right parietotemporal region is consistent with subarachnoid blood, grossly unchanged compared to the prior CT allowing for difference in modality. There is a background of global parenchymal volume loss and chronic white matter microangiopathy, unchanged. There is no solid mass lesion. There is no midline shift. Vascular: Normal flow voids. Skull and upper cervical spine: Normal marrow signal. Sinuses/Orbits: The paranasal sinuses are clear. The globes and orbits are unremarkable. Other: None. MRA HEAD FINDINGS Anterior circulation: The intracranial ICAs are patent. The left M1 and proximal M2 segments are patent. There has been interval recanalization of the left inferior M2 division which was occluded on the prior CTA. Is no evidence of branch vessel occlusion on the current study. The right MCA is patent The bilateral ACAs are patent. There is no aneurysm. Posterior circulation: The bilateral V4  segments are patent. The basilar artery is patent. The bilateral PCAs are patent. A left posterior communicating artery is identified. The right posterior communicating artery is not definitely seen. Anatomic variants: None. IMPRESSION: 1. Scattered small acute infarcts in the left MCA distribution as above. 2. Subarachnoid hemorrhage overlying the bilateral cerebral hemispheres, not significantly changed compared to the prior CT allowing for difference in modality. The small focus of intraparenchymal hemorrhage in the left parietal lobe is also unchanged. 3. Interval recanalization of the previously occluded left inferior M2 division. No high-grade stenosis or occlusion on the current study.  Electronically Signed   By: Valetta Mole M.D.   On: 12/02/2020 10:52   MR BRAIN WO CONTRAST  Result Date: 12/02/2020 CLINICAL DATA:  Acute onset of aphasia, history of strokes EXAM: MRI HEAD WITHOUT CONTRAST MRA HEAD WITHOUT CONTRAST TECHNIQUE: Multiplanar, multi-echo pulse sequences of the brain and surrounding structures were acquired without intravenous contrast. Angiographic images of the Circle of Willis were acquired using MRA technique without intravenous contrast. COMPARISON:  Same-day noncontrast head CT, brain MRI 06/25/2018 FINDINGS: MRI HEAD FINDINGS Brain: There are scattered small areas of diffusion restriction throughout the left MCA distribution with involvement of the insular cortex, anterior frontal lobe and parietal lobe cortex, and corona radiata, consistent with evolving acute infarcts. Additional diffusion restriction with corresponding SWI signal dropout in the left parietal lobe superiorly is likely due to intraparenchymal blood as seen on prior CT. Diffusion restriction in the left temporal lobe is also likely at least in part due to blood products. Sulcal FLAIR hyperintensity and associated SWI signal dropout overlying the left cerebral hemisphere and sylvian fissure as well as the right parietotemporal region is consistent with subarachnoid blood, grossly unchanged compared to the prior CT allowing for difference in modality. There is a background of global parenchymal volume loss and chronic white matter microangiopathy, unchanged. There is no solid mass lesion. There is no midline shift. Vascular: Normal flow voids. Skull and upper cervical spine: Normal marrow signal. Sinuses/Orbits: The paranasal sinuses are clear. The globes and orbits are unremarkable. Other: None. MRA HEAD FINDINGS Anterior circulation: The intracranial ICAs are patent. The left M1 and proximal M2 segments are patent. There has been interval recanalization of the left inferior M2 division which was occluded on  the prior CTA. Is no evidence of branch vessel occlusion on the current study. The right MCA is patent The bilateral ACAs are patent. There is no aneurysm. Posterior circulation: The bilateral V4 segments are patent. The basilar artery is patent. The bilateral PCAs are patent. A left posterior communicating artery is identified. The right posterior communicating artery is not definitely seen. Anatomic variants: None. IMPRESSION: 1. Scattered small acute infarcts in the left MCA distribution as above. 2. Subarachnoid hemorrhage overlying the bilateral cerebral hemispheres, not significantly changed compared to the prior CT allowing for difference in modality. The small focus of intraparenchymal hemorrhage in the left parietal lobe is also unchanged. 3. Interval recanalization of the previously occluded left inferior M2 division. No high-grade stenosis or occlusion on the current study. Electronically Signed   By: Valetta Mole M.D.   On: 12/02/2020 10:52   IR CT Head Ltd  Result Date: 12/02/2020 INDICATION: 65 year old female with past medical history significant for anemia, anxiety, depression, two prior strokes in the past 14 months (one presenting with right sided weakness, followed by improvement to mild residual deficit; the second with left sided weakness), HTN, loop recorder implant in October of 2021, osteoporosis and thrombocytopenia.  Two presented to emergency by EMS with acute onset of aphasia, right side weakness and incoordination. NIHSS 6 at presentation. Head CT showed no large acute territorial infarct or hemorrhage. CT angiogram of the head and neck showed a left M3/MCA occlusion. She received TNK at 10:20 a.m. on 12/01/2020. Given disabling symptoms, decision was made to proceed with diagnostic cerebral angiogram and mechanical thrombectomy. EXAM: ULTRASOUND-GUIDED VASCULAR ACCESS DIAGNOSTIC CEREBRAL ANGIOGRAM MECHANICAL THROMBECTOMY FLAT PANEL HEAD CT COMPARISON:  CT/CT angiogram of the head and  neck December 01, 2020. MEDICATIONS: No antibiotics administered. ANESTHESIA/SEDATION: The procedure was performed under general anesthesia. CONTRAST:  42 mL of Omnipaque 300 milligram/mL FLUOROSCOPY TIME:  Fluoroscopy Time: 9 minutes 24 seconds (476 mGy). COMPLICATIONS: SIR LEVEL B - Normal therapy, includes overnight admission for observation. TECHNIQUE: Informed written consent was obtained from the patient's husband after a thorough discussion of the procedural risks, benefits and alternatives. All questions were addressed. Maximal Sterile Barrier Technique was utilized including caps, mask, sterile gowns, sterile gloves, sterile drape, hand hygiene and skin antiseptic. A timeout was performed prior to the initiation of the procedure. The right groin was prepped and draped in the usual sterile fashion. Using a micropuncture kit and the modified Seldinger technique, access was gained to the right common femoral artery and an 8 French sheath was placed. Real-time ultrasound guidance was utilized for vascular access including the acquisition of a permanent ultrasound image documenting patency of the accessed vessel. Under fluoroscopy, a Zoom 88 guide catheter was navigated over a 6 [place] Berenstein 2 catheter and a 0.035" Terumo Glidewire into the aortic arch. The catheter was placed into the left common carotid artery and then advanced into the left internal carotid artery. The inner catheter was removed. Frontal and lateral angiograms of the head were obtained. FINDINGS: 1. Normal caliber of the right common femoral artery, adequate for vascular access. 2. 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 PROCEDURE: Under biplane roadmap, a Zoom 35 aspiration catheter was navigated over an Aristotle 14 microguidewire into the cavernous segment of the right ICA. The aspiration catheter was then advanced to the distal left M2/MCA inferior division branch. However, catheter did not track to the level of  occlusion. The catheter was subsequently removed. Then, a phenom 21 microcatheter was navigated over an Aristotle 14 micro guidewire into the left M3/MCA superior division branch, past the level of occlusion. Then, a 3 mm solitaire stent retriever was deployed across the occluded segment. The device was allowed to intercalated with the clot for 3 minute. Then, the catheter was gently retracted while continuous aspiration was applied to the guide catheter. Left ICA angiograms showed recanalization of the occluded segment with normal flow in the main branch and slow flow in a side branch (TICI 2C). Flat panel CT of the head was obtained and post processed in a separate workstation with concurrent attending physician supervision. Selected images were sent to PACS. Trace subarachnoid hemorrhage versus contrast extravasation noted in the left sylvian fissure. Follow-up left ICA angiograms with frontal and lateral views of the head showed resolution of slow flow in the side branch now with brisk contrast opacification with slow flow in the main branch likely related to basal spasm. Decision made not to treat vasospasm due to small subarachnoid hemorrhage seen on CT. The catheter was subsequently withdrawn. A right common femoral artery angiogram was obtained with right anterior oblique view. The puncture is at the level of the mid right common femoral artery which has normal caliber, adequate  for closure device. The femoral sheath was exchanged over the wire for a Perclose ProGlide which was utilized for access closure. Immediate hemostasis was achieved. IMPRESSION: Successful mechanical thrombectomy performed for treatment of a left M3/MCA occlusion. Final angiogram showed vasospasm with delayed flow to the branch. Small left sylvian subarachnoid hemorrhage versus contrast extravasation on postprocedural flat panel head CT. PLAN: 1. Transferred to ICU for continued monitoring. 2. Head CT in 3 hours to evaluate for  stability of contrast extravasation/subarachnoid hemorrhage. 3. Strict blood pressure control 120-140 mm Hg. Electronically Signed   By: Pedro Earls M.D.   On: 12/02/2020 10:07   IR US Guide Vasc Access Right  Result Date: 12/02/2020 INDICATION: 65 year old female with past medical history significant for anemia, anxiety, depression, two prior strokes in the past 14 months (one presenting with right sided weakness, followed by improvement to mild residual deficit; the second with left sided weakness), HTN, loop recorder implant in October of 2021, osteoporosis and thrombocytopenia. Two presented to emergency by EMS with acute onset of aphasia, right side weakness and incoordination. NIHSS 6 at presentation. Head CT showed no large acute territorial infarct or hemorrhage. CT angiogram of the head and neck showed a left M3/MCA occlusion. She received TNK at 10:20 a.m. on 12/01/2020. Given disabling symptoms, decision was made to proceed with diagnostic cerebral angiogram and mechanical thrombectomy. EXAM: ULTRASOUND-GUIDED VASCULAR ACCESS DIAGNOSTIC CEREBRAL ANGIOGRAM MECHANICAL THROMBECTOMY FLAT PANEL HEAD CT COMPARISON:  CT/CT angiogram of the head and neck December 01, 2020. MEDICATIONS: No antibiotics administered. ANESTHESIA/SEDATION: The procedure was performed under general anesthesia. CONTRAST:  42 mL of Omnipaque 300 milligram/mL FLUOROSCOPY TIME:  Fluoroscopy Time: 9 minutes 24 seconds (476 mGy). COMPLICATIONS: SIR LEVEL B - Normal therapy, includes overnight admission for observation. TECHNIQUE: Informed written consent was obtained from the patient's husband after a thorough discussion of the procedural risks, benefits and alternatives. All questions were addressed. Maximal Sterile Barrier Technique was utilized including caps, mask, sterile gowns, sterile gloves, sterile drape, hand hygiene and skin antiseptic. A timeout was performed prior to the initiation of the procedure. The  right groin was prepped and draped in the usual sterile fashion. Using a micropuncture kit and the modified Seldinger technique, access was gained to the right common femoral artery and an 8 French sheath was placed. Real-time ultrasound guidance was utilized for vascular access including the acquisition of a permanent ultrasound image documenting patency of the accessed vessel. Under fluoroscopy, a Zoom 88 guide catheter was navigated over a 6 [place] Berenstein 2 catheter and a 0.035" Terumo Glidewire into the aortic arch. The catheter was placed into the left common carotid artery and then advanced into the left internal carotid artery. The inner catheter was removed. Frontal and lateral angiograms of the head were obtained. FINDINGS: 1. Normal caliber of the right common femoral artery, adequate for vascular access. 2. 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 PROCEDURE: Under biplane roadmap, a Zoom 35 aspiration catheter was navigated over an Aristotle 14 microguidewire into the cavernous segment of the right ICA. The aspiration catheter was then advanced to the distal left M2/MCA inferior division branch. However, catheter did not track to the level of occlusion. The catheter was subsequently removed. Then, a phenom 21 microcatheter was navigated over an Aristotle 14 micro guidewire into the left M3/MCA superior division branch, past the level of occlusion. Then, a 3 mm solitaire stent retriever was deployed across the occluded segment. The device was allowed to intercalated with the clot  for 3 minute. Then, the catheter was gently retracted while continuous aspiration was applied to the guide catheter. Left ICA angiograms showed recanalization of the occluded segment with normal flow in the main branch and slow flow in a side branch (TICI 2C). Flat panel CT of the head was obtained and post processed in a separate workstation with concurrent attending physician supervision. Selected images were  sent to PACS. Trace subarachnoid hemorrhage versus contrast extravasation noted in the left sylvian fissure. Follow-up left ICA angiograms with frontal and lateral views of the head showed resolution of slow flow in the side branch now with brisk contrast opacification with slow flow in the main branch likely related to basal spasm. Decision made not to treat vasospasm due to small subarachnoid hemorrhage seen on CT. The catheter was subsequently withdrawn. A right common femoral artery angiogram was obtained with right anterior oblique view. The puncture is at the level of the mid right common femoral artery which has normal caliber, adequate for closure device. The femoral sheath was exchanged over the wire for a Perclose ProGlide which was utilized for access closure. Immediate hemostasis was achieved. IMPRESSION: Successful mechanical thrombectomy performed for treatment of a left M3/MCA occlusion. Final angiogram showed vasospasm with delayed flow to the branch. Small left sylvian subarachnoid hemorrhage versus contrast extravasation on postprocedural flat panel head CT. PLAN: 1. Transferred to ICU for continued monitoring. 2. Head CT in 3 hours to evaluate for stability of contrast extravasation/subarachnoid hemorrhage. 3. Strict blood pressure control 120-140 mm Hg. Electronically Signed   By: Pedro Earls M.D.   On: 12/02/2020 10:07   DG Abd Portable 1V  Result Date: 12/02/2020 CLINICAL DATA:  Feeding tube placement EXAM: PORTABLE ABDOMEN - 1 VIEW COMPARISON:  None. FINDINGS: Feeding tube coiled within the proximal stomach below the left hemidiaphragm. Nonobstructive bowel gas pattern. Mild cardiac enlargement. Visualized lungs clear. IMPRESSION: Feeding tube coiled within the proximal stomach. Electronically Signed   By: Jerilynn Mages.  Shick M.D.   On: 12/02/2020 15:18   EEG adult  Result Date: 12/02/2020 Lora Havens, MD     12/02/2020  1:09 PM Patient Name: REEVE MALLO MRN:  [id number] Epilepsy Attending: Lora Havens Referring Physician/Provider: Dr. Rosalin Hawking Date: 12/02/2020 Duration: 41.54 mins Patient history: 65 year old female with facial twitching and left gaze preference. EEG evaluate for seizure. Level of alertness: Awake, asleep AEDs during EEG study: LEV, ativan Technical aspects: This EEG study was done with scalp electrodes positioned according to the 10-20 International system of electrode placement. Electrical activity was acquired at a sampling rate of $Remov'500Hz'gDjzsR$  and reviewed with a high frequency filter of $RemoveB'70Hz'TVSMmxqc$  and a low frequency filter of $RemoveB'1Hz'XKvQbDbz$ . EEG data were recorded continuously and digitally stored. Description: The posterior dominant rhythm consists of 9-10 Hz activity of moderate voltage (25-35 uV) seen predominantly in posterior head regions, asymmetric ( L<R) and reactive to eye opening and eye closing. Sleep was characterized by vertex waves, sleep spindles (12 to 14 Hz), maximal frontocentral region. EEG showed continuous 3 to 6 Hz theta-delta slowing in left frontotemporal region.  Frequent spikes were also noted in left frontotemporal region.  Hyperventilation and photic stimulation were not performed.   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  No seizures  were seen throughout the recording. [place]   ECHOCARDIOGRAM COMPLETE  Result  Date: 12/03/2020    ECHOCARDIOGRAM REPORT   Patient Name:   SHARRIE SELF Eisenhuth Date of Exam: 12/03/2020 Medical Rec #:  [id number]          Height:       64.0 in Accession #:    [id number]         Weight:       125.2 lb Date of Birth:  1955/09/11          BSA:          1.603 m Patient Age:    65 years           BP:           106/66 mmHg Patient Gender: F                  HR:           90  bpm. Exam Location:  Inpatient Procedure: 2D Echo, Color Doppler and Cardiac Doppler Indications:    Stroke  History:        Patient has prior history of Echocardiogram examinations. Risk                 Factors:Hypertension.  Sonographer:    Jyl Heinz Referring Phys: [id number] [place] XU IMPRESSIONS  1. Left ventricular ejection fraction, by estimation, is 55 to 60%. The left ventricle has normal function. The left ventricle has no regional wall motion abnormalities. Left ventricular diastolic parameters were normal.  2. Right ventricular systolic function is normal. The right ventricular size is normal.  3. The mitral valve is normal in structure. No evidence of mitral valve regurgitation.  4. The aortic valve is normal in structure. Aortic valve regurgitation is not visualized. FINDINGS  Left Ventricle: Left ventricular ejection fraction, by estimation, is 55 to 60%. The left ventricle has normal function. The left ventricle has no regional wall motion abnormalities. The left ventricular internal cavity size was normal in size. There is  no left ventricular hypertrophy. Left ventricular diastolic parameters were normal. Right Ventricle: The right ventricular size is normal. Right vetricular wall thickness was not well visualized. Right ventricular systolic function is normal. Left Atrium: Left atrial size was normal in size. Right Atrium: Right atrial size was normal in size. Pericardium: There is no evidence of pericardial effusion. Mitral Valve: The mitral valve is normal in structure. No evidence of mitral valve regurgitation. Tricuspid Valve: The tricuspid valve is grossly normal. Tricuspid valve regurgitation is mild. Aortic Valve: The aortic valve is normal in structure. Aortic valve regurgitation is not visualized. Aortic valve peak gradient measures 5.9 mmHg. Pulmonic Valve: The pulmonic valve was grossly normal. Pulmonic valve regurgitation is not visualized. Aorta: The aortic root and ascending aorta  are structurally normal, with no evidence of dilitation. IAS/Shunts: The interatrial septum was not well visualized.  LEFT VENTRICLE PLAX 2D LVIDd:         4.30 cm     Diastology LVIDs:         2.90 cm     LV e' medial:    6.85 cm/s LV PW:         0.80 cm     LV E/e' medial:  11.0 LV IVS:        0.90 cm     LV e' lateral:   10.00 cm/s LVOT diam:     2.00 cm     LV E/e' lateral: 7.5 LV SV:         65 LV  SV Index:   40 LVOT Area:     3.14 cm  LV Volumes (MOD) LV vol d, MOD A2C: 85.1 ml LV vol d, MOD A4C: 82.4 ml LV vol s, MOD A2C: 36.2 ml LV vol s, MOD A4C: 34.8 ml LV SV MOD A2C:     48.9 ml LV SV MOD A4C:     82.4 ml LV SV MOD BP:      49.9 ml RIGHT VENTRICLE             IVC RV Basal diam:  3.20 cm     IVC diam: 1.80 cm RV Mid diam:    1.80 cm RV S prime:     10.00 cm/s TAPSE (M-mode): 2.2 cm LEFT ATRIUM             Index        RIGHT ATRIUM           Index LA diam:        2.00 cm 1.25 cm/m   RA Area:     12.20 cm LA Vol (A2C):   15.3 ml 9.54 ml/m   RA Volume:   28.90 ml  18.02 ml/m LA Vol (A4C):   22.0 ml 13.72 ml/m LA Biplane Vol: 20.3 ml 12.66 ml/m  AORTIC VALVE AV Area (Vmax): 2.78 cm AV Vmax:        121.00 cm/s AV Peak Grad:   5.9 mmHg LVOT Vmax:      107.00 cm/s LVOT Vmean:     71.700 cm/s LVOT VTI:       0.206 m  AORTA Ao Root diam: 2.90 cm MITRAL VALVE MV Area (PHT): 5.88 cm    SHUNTS MV Decel Time: 129 msec    Systemic VTI:  0.21 m MV E velocity: 75.10 cm/s  Systemic Diam: 2.00 cm MV A velocity: 77.80 cm/s MV E/A ratio:  0.97 Mertie Moores MD Electronically signed by Mertie Moores MD Signature Date/Time: 12/03/2020/2:41:39 PM    Final    ECHO TEE  Result Date: 12/08/2020    TRANSESOPHOGEAL ECHO REPORT   Patient Name:   [facility] Date of Exam: 12/08/2020 Medical Rec #:  [id number]          Height:       64.0 in Accession #:    [id number]         Weight:       119.9 lb Date of Birth:  26-Sep-1955          BSA:          1.574 m Patient Age:    1 years           BP:           138/80 mmHg  Patient Gender: F                  HR:           114 bpm. Exam Location:  Inpatient Procedure: Transesophageal Echo Indications:    Stroke  History:        Patient has prior history of Echocardiogram examinations.                 Stroke.  Sonographer:    Philipp Deputy RDCS Referring Phys: [id number] [place]: The transesophogeal probe was passed without difficulty through the esophogus of the patient. Sedation performed by different physician. The patient's vital signs; including heart rate, blood pressure, and oxygen saturation; remained stable throughout  the procedure. The patient developed no complications during the procedure. IMPRESSIONS  1. Left ventricular ejection fraction, by estimation, is 60 to 65%. The left ventricle has normal function. The left ventricle has no regional wall motion abnormalities.  2. Right ventricular systolic function is normal. The right ventricular size is normal.  3. No left atrial/left atrial appendage thrombus was detected.  4. The mitral valve is abnormal. Mild mitral valve regurgitation. No evidence of mitral stenosis.  5. The aortic valve is tricuspid. Aortic valve regurgitation is not visualized. No aortic stenosis is present.  6. The inferior vena cava is normal in size with greater than 50% respiratory variability, suggesting right atrial pressure of 3 mmHg.  7. Agitated saline contrast bubble study was negative, with no evidence of any interatrial shunt. Conclusion(s)/Recommendation(s): Normal biventricular function without evidence of hemodynamically significant valvular heart disease. FINDINGS  Left Ventricle: Left ventricular ejection fraction, by estimation, is 60 to 65%. The left ventricle has normal function. The left ventricle has no regional wall motion abnormalities. The left ventricular internal cavity size was normal in size. There is  no left ventricular hypertrophy. Right Ventricle: The right ventricular size is normal. No increase in  right ventricular wall thickness. Right ventricular systolic function is normal. Left Atrium: Left atrial size was normal in size. No left atrial/left atrial appendage thrombus was detected. Right Atrium: Right atrial size was normal in size. Pericardium: There is no evidence of pericardial effusion. Mitral Valve: The mitral valve is abnormal. There is mild thickening of the mitral valve leaflet(s). Mild mitral valve regurgitation. No evidence of mitral valve stenosis. Tricuspid Valve: The tricuspid valve is normal in structure. Tricuspid valve regurgitation is not demonstrated. No evidence of tricuspid stenosis. Aortic Valve: The aortic valve is tricuspid. Aortic valve regurgitation is not visualized. No aortic stenosis is present. Pulmonic Valve: The pulmonic valve was normal in structure. Pulmonic valve regurgitation is not visualized. No evidence of pulmonic stenosis. Aorta: The aortic root is normal in size and structure. Venous: The inferior vena cava is normal in size with greater than 50% respiratory variability, suggesting right atrial pressure of 3 mmHg. IAS/Shunts: No atrial level shunt detected by color flow Doppler. Agitated saline contrast bubble study was negative, with no evidence of any interatrial shunt. There is no evidence of a patent foramen ovale. 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 Two presented to emergency by EMS with acute onset of aphasia, right side weakness and  incoordination. NIHSS 6 at presentation. Head CT showed no large acute territorial infarct or hemorrhage. CT angiogram of the head and neck showed a left M3/MCA occlusion. She received TNK at 10:20 a.m. on 12/01/2020. Given disabling symptoms, decision was made to proceed with diagnostic cerebral angiogram and mechanical thrombectomy. EXAM: ULTRASOUND-GUIDED VASCULAR ACCESS DIAGNOSTIC CEREBRAL ANGIOGRAM MECHANICAL THROMBECTOMY FLAT PANEL HEAD CT COMPARISON:  CT/CT angiogram of the head and neck December 01, 2020. MEDICATIONS: No antibiotics administered. ANESTHESIA/SEDATION: The procedure was performed under general anesthesia. CONTRAST:  42 mL of Omnipaque 300 milligram/mL FLUOROSCOPY TIME:  Fluoroscopy Time: 9 minutes 24 seconds (  476 mGy). COMPLICATIONS: SIR LEVEL B - Normal therapy, includes overnight admission for observation. TECHNIQUE: Informed written consent was obtained from the patient's husband after a thorough discussion of the procedural risks, benefits and alternatives. All questions were addressed. Maximal Sterile Barrier Technique was utilized including caps, mask, sterile gowns, sterile gloves, sterile drape, hand hygiene and skin antiseptic. A timeout was performed prior to the initiation of the procedure. The right groin was prepped and draped in the usual sterile fashion. Using a micropuncture kit and the modified Seldinger technique, access was gained to the right common femoral artery and an 8 French sheath was placed. Real-time ultrasound guidance was utilized for vascular access including the acquisition of a permanent ultrasound image documenting patency of the accessed vessel. Under fluoroscopy, a Zoom 88 guide catheter was navigated over a 6 [place] Berenstein 2 catheter and a 0.035" Terumo Glidewire into the aortic arch. The catheter was placed into the left common carotid artery and then advanced into the left internal carotid artery. The inner catheter was removed. Frontal and lateral  angiograms of the head were obtained. FINDINGS: 1. Normal caliber of the right common femoral artery, adequate for vascular access. 2. 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 PROCEDURE: Under biplane roadmap, a Zoom 35 aspiration catheter was navigated over an Aristotle 14 microguidewire into the cavernous segment of the right ICA. The aspiration catheter was then advanced to the distal left M2/MCA inferior division branch. However, catheter did not track to the level of occlusion. The catheter was subsequently removed. Then, a phenom 21 microcatheter was navigated over an Aristotle 14 micro guidewire into the left M3/MCA superior division branch, past the level of occlusion. Then, a 3 mm solitaire stent retriever was deployed across the occluded segment. The device was allowed to intercalated with the clot for 3 minute. Then, the catheter was gently retracted while continuous aspiration was applied to the guide catheter. Left ICA angiograms showed recanalization of the occluded segment with normal flow in the main branch and slow flow in a side branch (TICI 2C). Flat panel CT of the head was obtained and post processed in a separate workstation with concurrent attending physician supervision. Selected images were sent to PACS. Trace subarachnoid hemorrhage versus contrast extravasation noted in the left sylvian fissure. Follow-up left ICA angiograms with frontal and lateral views of the head showed resolution of slow flow in the side branch now with brisk contrast opacification with slow flow in the main branch likely related to basal spasm. Decision made not to treat vasospasm due to small subarachnoid hemorrhage seen on CT. The catheter was subsequently withdrawn. A right common femoral artery angiogram was obtained with right anterior oblique view. The puncture is at the level of the mid right common femoral artery which has normal caliber, adequate for closure device. The femoral sheath was  exchanged over the wire for a Perclose ProGlide which was utilized for access closure. Immediate hemostasis was achieved. IMPRESSION: Successful mechanical thrombectomy performed for treatment of a left M3/MCA occlusion. Final angiogram showed vasospasm with delayed flow to the branch. Small left sylvian subarachnoid hemorrhage versus contrast extravasation on postprocedural flat panel head CT. PLAN: 1. Transferred to ICU for continued monitoring. 2. Head CT in 3 hours to evaluate for stability of contrast extravasation/subarachnoid hemorrhage. 3. Strict blood pressure control 120-140 mm Hg. Electronically Signed   By: Pedro Earls M.D.   On: 12/02/2020 10:07   CT HEAD CODE STROKE WO CONTRAST  Result Date: 12/01/2020  CLINICAL DATA:  Code stroke.  Aphasia, right-sided weakness EXAM: CT HEAD WITHOUT CONTRAST TECHNIQUE: Contiguous axial images were obtained from the base of the skull through the vertex without intravenous contrast. COMPARISON:  06/24/2020 FINDINGS: Brain: There is no acute intracranial hemorrhage, mass effect, or edema. No new loss of gray-white differentiation. Prominence of the ventricles and sulci reflects similar parenchymal volume loss. Scattered chronic cortical infarcts. Small chronic infarct of the body of the left caudate. Small chronic right greater than left cerebellar infarcts. No extra-axial collection. Vascular: No hyperdense vessel. There is intracranial atherosclerotic calcification at the skull base. Skull: Unremarkable. Sinuses/Orbits: No acute abnormality. Other: Mastoid air cells are clear. ASPECTS ([place] Stroke Program Early CT Score) - Ganglionic level infarction (caudate, lentiform nuclei, internal capsule, insula, M1-M3 cortex): 7 - Supraganglionic infarction (M4-M6 cortex): 3 Total score (0-10 with 10 being normal): 10 IMPRESSION: There is no acute intracranial hemorrhage or evidence of acute infarction. ASPECT score is 10. These results were communicated  to Dr. Cheral Marker at 10:11 am on 12/01/2020 by text page via the [facility] messaging system. Electronically Signed   By: Macy Mis M.D.   On: 12/01/2020 10:12   VAS [name] LOWER EXTREMITY VENOUS (DVT)  Result Date: 12/03/2020  Lower Venous DVT Study Patient Name:  SHARONE PICCHI Montoya  Date of Exam:   12/03/2020 Medical Rec #: [id number]           Accession #:    [id number] Date of Birth: [birth year]           Patient Gender: F Patient Age:   65 years Exam Location:  [facility] Procedure:      VAS [name] LOWER EXTREMITY VENOUS (DVT) Referring Phys: Cornelius Moras XU --------------------------------------------------------------------------------  Indications: Stroke.  Comparison Study: 06-27-2020 Prior lower extremity venous was negative for DVT                   bilaterally. Performing Technologist: Darlin Coco RDMS, RVT  Examination Guidelines: A complete evaluation includes B-mode imaging, spectral Doppler, color Doppler, and power Doppler as needed of all accessible portions of each vessel. Bilateral testing is considered an integral part of a complete examination. Limited examinations for reoccurring indications may be performed as noted. The reflux portion of the exam is performed with the patient in reverse Trendelenburg.  +---------+---------------+---------+-----------+----------+--------------+  RIGHT     Compressibility Phasicity Spontaneity Properties Thrombus Aging  +---------+---------------+---------+-----------+----------+--------------+  CFV       Full            Yes       Yes                                    +---------+---------------+---------+-----------+----------+--------------+  SFJ       Full                                                             +---------+---------------+---------+-----------+----------+--------------+  FV Prox   Full                                                             +---------+---------------+---------+-----------+----------+--------------+  FV Mid    Full                                                              +---------+---------------+---------+-----------+----------+--------------+  FV Distal Full                                                             +---------+---------------+---------+-----------+----------+--------------+  PFV       Full                                                             +---------+---------------+---------+-----------+----------+--------------+  POP       Full            Yes       Yes                                    +---------+---------------+---------+-----------+----------+--------------+  PTV       Full                                                             +---------+---------------+---------+-----------+----------+--------------+  PERO      Full                                                             +---------+---------------+---------+-----------+----------+--------------+   +---------+---------------+---------+-----------+----------+--------------+  LEFT      Compressibility Phasicity Spontaneity Properties Thrombus Aging  +---------+---------------+---------+-----------+----------+--------------+  CFV       Full            Yes       Yes                                    +---------+---------------+---------+-----------+----------+--------------+  SFJ       Full                                                             +---------+---------------+---------+-----------+----------+--------------+  FV Prox   Full                                                             +---------+---------------+---------+-----------+----------+--------------+  FV Mid    Full                                                             +---------+---------------+---------+-----------+----------+--------------+  FV Distal Full                                                             +---------+---------------+---------+-----------+----------+--------------+  PFV       Full                                                              +---------+---------------+---------+-----------+----------+--------------+  POP       Full            Yes       Yes                                    +---------+---------------+---------+-----------+----------+--------------+  PTV       Full                                                             +---------+---------------+---------+-----------+----------+--------------+  PERO      Full                                                             +---------+---------------+---------+-----------+----------+--------------+     Summary: RIGHT: - There is no evidence of deep vein thrombosis in the lower extremity.  - No cystic structure found in the popliteal fossa.  LEFT: - There is no evidence of deep vein thrombosis in the lower extremity.  - No cystic structure found in the popliteal fossa.  *See table(s) above for measurements and observations. Electronically signed by Deitra Mayo MD on 12/03/2020 at 1:57:28 PM.    Final    CT ANGIO HEAD NECK W WO CM W PERF (CODE STROKE)  Result Date: 12/01/2020 CLINICAL DATA:  Neuro deficit, acute, stroke suspected EXAM: CT ANGIOGRAPHY HEAD AND NECK CT PERFUSION BRAIN TECHNIQUE: Multidetector CT imaging of the head and neck was performed using the standard protocol during bolus administration of intravenous contrast. Multiplanar CT image reconstructions and MIPs were obtained to evaluate the vascular anatomy. Carotid stenosis measurements (when applicable) are obtained utilizing NASCET criteria, using the distal internal carotid diameter as the denominator. Multiphase CT imaging of the brain was performed following IV bolus contrast injection. Subsequent parametric perfusion maps were calculated using RAPID software. CONTRAST:  145mL OMNIPAQUE IOHEXOL 350 MG/ML  SOLN COMPARISON:  June 2021 FINDINGS: CTA NECK Aortic arch: Minimal calcified plaque along the arch. Great vessel origins are patent. Right carotid system: Patent. Trace plaque at the ICA origin. No  stenosis. Left carotid system: Patent.  No stenosis. Vertebral arteries: Patent and codominant.  No stenosis. Skeleton: Mild cervical spine degenerative changes. Other neck: Unremarkable. Upper chest: No apical lung mass.  Patulous esophagus. Review of the MIP images confirms the above findings CTA HEAD Mild motion artifact is present. Anterior circulation: Intracranial internal carotid arteries are patent. Mild calcified plaque is present without significant stenosis. Left M1 MCA is patent. There is occlusion of distal left M2 MCA branch at the posterior aspect of the sylvian fissure (for example see series 12, image 28). Posterior circulation: Intracranial vertebral arteries are patent. Basilar artery is patent. Major cerebellar artery origins are patent. Posterior cerebral arteries are patent. Venous sinuses: Patent as allowed by contrast bolus timing. Review of the MIP images confirms the above findings CT Brain Perfusion Findings: CBF (<30%) Volume: 22mL Perfusion (Tmax>6.0s) volume: 52mL Mismatch Volume: 88mL Infarction Location: Posterior left MCA territory IMPRESSION: Occlusion of distal left M2 MCA branch at the posterior aspect of the sylvian fissure (see saved key images). Perfusion imaging demonstrates no core infarction and 16 mL of penumbra within the posterior left MCA territory. No hemodynamically significant stenosis in the neck. These results were called by telephone at the time of interpretation on 12/01/2020 at 10:27 am to provider [facility] , who verbally acknowledged these results. Electronically Signed   By: Macy Mis M.D.   On: 12/01/2020 10:34   Home sleep test  Result Date: 12/12/2020 Larey Seat, MD     12/12/2020 12:20 PM     [facility] TEST REPORT ( by Watch PAT)  STUDY DATE:  11-30-2020 DOB:  03/19/1955  ORDERING CLINICIAN: Larey Seat, MD REFERRING CLINICIAN: Dr Sarina Ill, MD and Dr. Horald Pollen, MD  CLINICAL INFORMATION/HISTORY:  KAYIA BILLINGER is a 65 y.o. Caucasian female and was seen in a sleep consultation on 11/17/2020 from [name] upon her request for a sleep consultation. . Chief concern according to patient :  " looking for reasons for my strokes, last in June, with 3 new lesions on MRI "  husband [place]. Internal referral from [name] for morning HA and witnessed sleep apnea. Pt no longer snoring or having sleep interruptions. Sleeps about 6 hrs straights. Pt is using kinesiology tape to keep her mouth closed.   [name] has a past medical history of Anemia, Anxiety, CVA- repeated cryptogenic strokes, 2022, Dysmenorrhea, Epistaxis, Endometriosis, Fibroid, Hypertension, Iron deficiency anemia, Microhematuria, Osteoporosis, Tachycardia, and Thrombocytopenia ([place]). Loop recorder was negative for atrial fibrillation, loop recorder was implanted 12 months ago.  Sleep relevant medical history: Nocturia- 2-5 times - but not since she tapes her jaw- insomnia. Uses a taped jaw guard for this HST for half of the night and the other half was recorded without .  Epworth sleepiness score: 3/24.  BMI: 20.3 kg/m  Neck Circumference: 13"  FINDINGS:  Sleep Summary:  Total Recording Time (hours, min): The total recording time for this home sleep test was 10 hours and 4 minutes of which total sleep time was estimated to be 8 hours 51 minutes.    Percent REM (%): 19.1% of sleep time were REM sleep.  Respiratory Indices:  Calculated pAHI (per hour): The calculated AHI was only 5.3/h but during REM sleep rose to 11.8/h in non-REM sleep the baseline AHI was only 4/h. Positional apnea data show a supine AHI of 5.3 and supine RDI of 22.9/h.  When while sleeping on the right the patient had no apnea at all and while sleeping on the left an AHI of 9.3/h was noted.  So overall nonsupine sleep does benefit the patient. Snoring was moderate in severity of his 40 dB mean volume.  Only 1% of total sleep time was  accompanied by snoring.                           REM pAHI:                                               NREM pAHI:                                                                        Oxygen Saturation Statistics:  O2 Saturation Range (%): Range between a nadir of 89% of the maximum 100% with a mean saturation of 97%.  The patient's oxygen saturation always states at or above 89%.                                   O2 Saturation (minutes) <89%:   0 minutes      Pulse Rate Statistics:   Pulse Range:   Pulse ranged between 53 and 103 bpm with a mean heart rate of 68 bpm.  Please note this is home sleep test is not able to identify cardiac arrhythmias only heart rate.            IMPRESSION:  This HST confirms the presence of of moderate snoring but only mild very mild sleep apnea. I reviewed the sleep data before and after the patient indicated that she used her mouthguard or facemask.  It appears that this change took place around 2 AM, the first half of the night was with mouthguard for the second half without.  I see no major improvement and the patient slept without the mouthguard REM sleep was noted before and after, there were no snoring or positional data recorded after 11:00.  I would base any further recommendations on the patient's perception if the taped mouthguard prevents her from snoring or not.  This very mild degree of apnea would not require CPAP intervention as long as the patient could stay in the nonsupine sleep position.  RECOMMENDATION:  I like to confirm that the patient changed the mouth guard at 2 AM and took it off. I would base any further recommendations on the patient's perception if the taped mouthguard prevents her from snoring or not.  This very mild degree of apnea would not require CPAP intervention as long as the patient could stay in the nonsupine sleep position.  INTERPRETING PHYSICIAN:  Melvyn Novas, MD Medical Director of [facility].  HISTORY OF  PRESENT ILLNESS  Ms. SIMREN POPSON is a 65 y.o. female with history of anemia, anxiety, depression, two prior strokes, HTN, loop recorder implant in 10/2019, osteoporosis, and thrombocytopenia presenting with acute onset of aphasia and worsened right sided incoordination relative to her baseline deficit. On arrival to the ED the patient was with dense receptive aphasia and mild extensor posturing of her RUE with mild RUE and RLE weakness. Etiology for patient stroke unclear, still consistent with embolic source.  However loop recorder so far negative for A. fib.  Once stable, may consider further cardioembolic work-up with TEE, may consider empiric anticoagulation treatment.  Currently patient has small SAH from procedure, will start aspirin 81 at this time.  Continue home Lipitor 40.  Patient failed swallow, will put on core track and start tube feeding n.p.o. meds.  Given seizure activity overnight, and EEG left spikes, will continue Keppra 500 twice daily.  BP goal less than 160 given SAH postprocedure.  TEE completed prior to discharge to CIR.  HOSPITAL COURSE Stroke:  left distal M2/M3 occlusion s/p TNK and IR, complicated by small SAH with TNK reversal with TXA, etiology recurrent cryptogenic stroke with negative prior work-up CT head - Unchanged subarachnoid hyperdensity over both hemispheres, left-greater-than-right, likely a combination contrast extravasation and subarachnoid blood.  Unchanged subcortical intraparenchymal blood in the left parietal lobe. MRI head Scattered small acute infarcts in the left MCA distribution as above. Subarachnoid hemorrhage overlying the bilateral cerebral hemispheres, not significantly changed compared to the prior CT allowing for difference in modality. The small focus of intraparenchymal hemorrhage in the left parietal lobe is also unchanged.  MRA head  Interval recanalization of the previously occluded left inferior M2 division. No high-grade stenosis or  occlusion on the current study. IR - Successful mechanical thrombectomy, small left sylvian subarachnoid hemorrhage vs contrast extravasation on postprocedural flat panel head ct 2D Echo   ejection fraction 55 to 60%.  No cardiac source of embolism. EEG-  evidence of cortical dysfunction and an epileptogenicity arising from left frontotemporal region, likely secondary to underlying structural abnormality.  No seizures  were seen throughout the recording. October 2021 Loop Recorder- negative for Afib TEE- 12/8- Mild MR, Normal LV EF 60%, No LAA thrombus, No ASD/PFO, Negative bubble study, Normal AV, Normal Aortic root, No effusion , No source of embolus Venous Duplex-no evidence of DVT  LDL 89 HgbA1c 5.7 SCDs for VTE prophylaxis aspirin 81 mg daily prior to admission, now on aspirin 325 mg daily. Hold plavix due to [facility] Patient counseled to be compliant with her antithrombotic medications Ongoing aggressive stroke risk factor management Therapy recommendations: Acute inpatient rehab Disposition:  Pending   Previous Stroke- June 2022 Cryptogenic stroke with loop recorder placement MRI/MRA brain done revealing punctate infarct and high "report except faint areas of DWI signal in bilateral precentral gyri question acute/subacute infarct or artifact.  Stroke felt to be embolic recorder was interrogated with negative for A. Fib     Possible Seizure activity Left facial twitching with altered mental status Ativan overnight EEG done 12/02/2020 showed left spikes Loaded with keppra and continue keppra BID   Hypertension Stable             BP goal < 160 given SAH post procedure             Long-term BP goal normotensive Home medications losartan Cleviprex d/c, managed well with home medications and prns   Hyperlipidemia Home meds:  None LDL 89, goal <  70 Lipitor $RemoveBe'40mg'HUiaAhcQZ$  Continue statin at discharge   Diabetes type II HgbA1c 5.7, goal < 7.0 Controlled   Dysphagia SLP consult  complete Recommend regular diet with thin liquids Meds whole with liquid Turn off tube feeds and monitor oral intake- d/c cortrak   Other Stroke Risk Factors Advanced age   Other Active Problems Vascular Dementia Altered Mental Status- improved Frequent reorientation Sleep hygiene  DISCHARGE EXAM Blood pressure 113/75, pulse 74, temperature 98.3 F (36.8 C), temperature source Oral, resp. rate 18, weight 54.4 kg, last menstrual period 01/02/2003, SpO2 100 %.   PHYSICAL EXAM Pleasant middle-aged Caucasian lady not in distress. . Afebrile. Head is nontraumatic. Neck is supple without bruit.    Cardiac exam no murmur or gallop. Lungs are clear to auscultation. Distal pulses are well felt.  NEURO:  Mental Status: AA&Ox4 Speech/Language: speech is without dysarthria or aphasia.     Cranial Nerves:  II: PERRL. Visual fields full.  III, IV, VI: EOMI. Eyelids elevate symmetrically.  V: Sensation is intact to light touch and symmetrical to face.  VII: Slight right facial droop present VIII: hearing intact to voice. IX, X: Phonation is normal.  XII: tongue is midline without fasciculations. Motor: 5/5 strength to LUE, LLE an RLE, 4/5 to RUE Tone: is normal and bulk is normal Sensation- Intact to light touch bilaterally. Extinction absent to light touch to DSS.  Coordination: Slight drift in RUE.  Gait- deferred    DISCHARGE PLAN Disposition:  Transfer to [place] for ongoing PT, OT and ST ASA $Remo'325mg'RiWju$  Recommend ongoing stroke risk factor control by Primary Care Physician at time of discharge from inpatient rehabilitation. Follow-up PCP Horald Pollen, MD in 2 weeks following discharge from rehab. Follow-up in [facility] in 4 weeks following discharge from rehab, office to schedule an appointment.   32 minutes were spent preparing discharge.  Patient seen and examined by NP/APP with MD. MD to update note as needed.    Janine Ores, DNP, FNP-BC [facility] Pager: [phone]  I have personally obtained history,examined this patient, reviewed notes, independently viewed imaging studies, participated in medical decision making and plan of care.ROS completed by me personally and pertinent positives fully documented  I have made any additions or clarifications directly to the above note. Agree with note above.    Antony Contras, MD Medical Director [facility] Pager: [phone] 01/02/2021 4:29 PM

## 2020-12-26 LAB — CUP PACEART REMOTE DEVICE CHECK
Date Time Interrogation Session: 20221223190254
Implantable Pulse Generator Implant Date: 20211020

## 2020-12-27 ENCOUNTER — Encounter: Payer: Self-pay | Admitting: Occupational Therapy

## 2020-12-27 ENCOUNTER — Ambulatory Visit: Payer: Medicare HMO | Admitting: Speech Pathology

## 2020-12-27 ENCOUNTER — Ambulatory Visit: Payer: Medicare HMO | Admitting: Occupational Therapy

## 2020-12-27 ENCOUNTER — Ambulatory Visit: Payer: Medicare HMO | Admitting: Physical Therapy

## 2020-12-27 ENCOUNTER — Ambulatory Visit (INDEPENDENT_AMBULATORY_CARE_PROVIDER_SITE_OTHER): Payer: Medicare HMO

## 2020-12-27 ENCOUNTER — Other Ambulatory Visit: Payer: Self-pay

## 2020-12-27 DIAGNOSIS — R278 Other lack of coordination: Secondary | ICD-10-CM

## 2020-12-27 DIAGNOSIS — R41841 Cognitive communication deficit: Secondary | ICD-10-CM

## 2020-12-27 DIAGNOSIS — I63512 Cerebral infarction due to unspecified occlusion or stenosis of left middle cerebral artery: Secondary | ICD-10-CM | POA: Diagnosis not present

## 2020-12-27 DIAGNOSIS — M6281 Muscle weakness (generalized): Secondary | ICD-10-CM | POA: Diagnosis not present

## 2020-12-27 DIAGNOSIS — R2689 Other abnormalities of gait and mobility: Secondary | ICD-10-CM

## 2020-12-27 DIAGNOSIS — R41842 Visuospatial deficit: Secondary | ICD-10-CM | POA: Diagnosis not present

## 2020-12-27 DIAGNOSIS — I63321 Cerebral infarction due to thrombosis of right anterior cerebral artery: Secondary | ICD-10-CM

## 2020-12-27 DIAGNOSIS — R4701 Aphasia: Secondary | ICD-10-CM | POA: Diagnosis not present

## 2020-12-27 DIAGNOSIS — R41844 Frontal lobe and executive function deficit: Secondary | ICD-10-CM

## 2020-12-27 DIAGNOSIS — R262 Difficulty in walking, not elsewhere classified: Secondary | ICD-10-CM | POA: Diagnosis not present

## 2020-12-27 NOTE — Therapy (Signed)
[place]. [place], [name], [address] Phone: [phone]   Fax:  [phone]  Speech Language Pathology Treatment  Patient Details  Name: Jacqueline Bonilla MRN: [id number] Date of Birth: 10/29/1955 Referring Provider (SLP): Dawayne Patricia   Encounter Date: 12/27/2020   End of Session - 12/27/20 1017     Visit Number 3    Number of Visits 17    Date for SLP Re-Evaluation 02/20/21    SLP Start Time 4431    SLP Stop Time  1055    SLP Time Calculation (min) 40 min    Activity Tolerance Patient tolerated treatment well             Past Medical History:  Diagnosis Date   Anemia    Anxiety    Depression    Dysmenorrhea    Endometriosis    Fibroid    Hypertension    Microhematuria    negative workup   Osteoporosis    Tachycardia    Thrombocytopenia ([place])     Past Surgical History:  Procedure Laterality Date   BREAST BIOPSY     BUBBLE STUDY  12/08/2020   Procedure: BUBBLE STUDY;  Surgeon: Josue Hector, MD;  Location: [place];  Service: Cardiovascular;;   CESAREAN SECTION     hysteroscopic resection     implantable loop recorder implant  10/21/2019   Medtronic Reveal Linq model LNQ 22 ([place] [id number]) implantable loop recorder   IR CT HEAD LTD  12/01/2020   IR PERCUTANEOUS ART THROMBECTOMY/INFUSION INTRACRANIAL INC DIAG ANGIO  12/01/2020   IR US GUIDE VASC ACCESS RIGHT  12/01/2020   RADIOLOGY WITH ANESTHESIA N/A 12/01/2020   Procedure: IR WITH ANESTHESIA - CODE STROKE;  Surgeon: Radiologist, Medication, MD;  Location: [place];  Service: Radiology;  Laterality: N/A;   TEE WITHOUT CARDIOVERSION N/A 12/08/2020   Procedure: TRANSESOPHAGEAL ECHOCARDIOGRAM (TEE);  Surgeon: Josue Hector, MD;  Location: [facility] ENDOSCOPY;  Service: Cardiovascular;  Laterality: N/A;    There were no vitals filed for this visit.   Subjective Assessment - 12/27/20 1016     Subjective "We went down to [facility]."     Currently in Pain? No/denies                   ADULT SLP TREATMENT - 12/27/20 1428       General Information   Behavior/Cognition Alert;Cooperative;Pleasant mood      Treatment Provided   Treatment provided Cognitive-Linquistic      Cognitive-Linquistic Treatment   Treatment focused on Cognition    Skilled Treatment Reviewed memory journal and put it in patient's planner to share with her husband. SLP assisted pt in coming L-V questionnaire to determine things important to her to create a more person-specific treatment. Provided pt with a bright blue piece of paper to assist with reading and a clip on the bottom to slide easier to assist with reading. Pt reports this was helpful. SLP to laminate one for pt to have at home.      Assessment / Recommendations / Plan   Plan Continue with current plan of care      Progression Toward Goals   Progression toward goals Progressing toward goals                SLP Short Term Goals - 12/20/20 1709       SLP SHORT TERM GOAL #1   Title Pt will provide 3 areas of challenge  for her given min verbal cues to demonstrate increased awareness.    Time 4    Period Weeks    Status New    Target Date 01/17/21      SLP SHORT TERM GOAL #2   Title Pt will recall 3 memory strategies to aid recall of important information with minA.    Time 4    Period Weeks    Status New    Target Date 01/17/21      SLP SHORT TERM GOAL #3   Title Pt will identify errors during structured cognitive task with modA verbal cues.    Time 4    Period Weeks    Status New    Target Date 01/17/21      SLP SHORT TERM GOAL #4   Title Pt will recall 3 word finding strategies to decrease anomia in conversation given minA.    Time 4    Period Weeks    Status New    Target Date 01/17/21              SLP Long Term Goals - 12/20/20 1711       SLP LONG TERM GOAL #1   Title Pt will provide 3 areas of challenge for her independently to demonstrate  increased awareness of deficits.    Time 8    Period Weeks    Status New    Target Date 02/14/21      SLP LONG TERM GOAL #2   Title Patient will demonstrate use of memory strategies to schedule activities, recall weekly events and items to maintain safety to participate socially in functional living environment    Time 8    Period Weeks    Status New    Target Date 02/14/21      SLP LONG TERM GOAL #3   Title Pt will identify errors during structured cognitive task with minA verbal cues.    Time 8    Period Weeks    Status New    Target Date 02/14/21      SLP LONG TERM GOAL #4   Title Pt will demonstrate 2-3 word finding strategies to decrease anomia in conversation independently.    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   Malnutrition of moderate degree 12/07/2020   Stroke (cerebrum) ([place]) 12/01/2020   Dizziness 08/30/2020   History of stroke 07/14/2020   Azotemia 07/11/2020   Anemia 07/11/2020   CVA (cerebral vascular accident) ([place]) 06/28/2020   Cerebral infarction ([place]) 06/25/2020   Acute left-sided weakness 06/24/2020   Vascular dementia with behavior disturbance 10/13/2019   Vascular dementia without behavioral disturbance ([place]) 07/15/2019   Cerebrovascular accident ([place]) 06/25/2019   Brain fog 04/22/2019   History of COVID-19 04/22/2019   Essential hypertension 03/03/2019   Hypertensive urgency 03/03/2019    [name], CCC-SLP 12/27/2020, 2:33 PM  [place]. [place], [name], [address] Phone: [phone]   Fax:  [phone]   Name: Jacqueline Bonilla MRN: [id number] Date of Birth: 09-Feb-1955

## 2020-12-27 NOTE — Therapy (Signed)
[place]. [place], [name], [address] Phone: [phone]   Fax:  [phone]  Physical Therapy Treatment  Patient Details  Name: Jacqueline Bonilla MRN: [id number] Date of Birth: 1955-07-25 Referring Provider (PT): Jal   Encounter Date: 12/27/2020   PT End of Session - 12/27/20 0851     Visit Number 2    Number of Visits 12    Date for PT Re-Evaluation 02/03/21    PT Start Time 0851    PT Stop Time 0930    PT Time Calculation (min) 39 min    Equipment Utilized During Treatment Gait belt    Activity Tolerance Patient tolerated treatment well    Behavior During Therapy [facility] for tasks assessed/performed;Flat affect             Past Medical History:  Diagnosis Date   Anemia    Anxiety    Depression    Dysmenorrhea    Endometriosis    Fibroid    Hypertension    Microhematuria    negative workup   Osteoporosis    Tachycardia    Thrombocytopenia ([place])     Past Surgical History:  Procedure Laterality Date   BREAST BIOPSY     BUBBLE STUDY  12/08/2020   Procedure: BUBBLE STUDY;  Surgeon: Josue Hector, MD;  Location: [place];  Service: Cardiovascular;;   CESAREAN SECTION     hysteroscopic resection     implantable loop recorder implant  10/21/2019   Medtronic Reveal Linq model LNQ 22 ([place] [id number]) implantable loop recorder   IR CT HEAD LTD  12/01/2020   IR PERCUTANEOUS ART THROMBECTOMY/INFUSION INTRACRANIAL INC DIAG ANGIO  12/01/2020   IR US GUIDE VASC ACCESS RIGHT  12/01/2020   RADIOLOGY WITH ANESTHESIA N/A 12/01/2020   Procedure: IR WITH ANESTHESIA - CODE STROKE;  Surgeon: Radiologist, Medication, MD;  Location: [place];  Service: Radiology;  Laterality: N/A;   TEE WITHOUT CARDIOVERSION N/A 12/08/2020   Procedure: TRANSESOPHAGEAL ECHOCARDIOGRAM (TEE);  Surgeon: Josue Hector, MD;  [facility] END[facility]COPY;  Service: Cardiovascular;  Laterality: N/A;    There were no vitals filed for this  visit.   Subjective Assessment - 12/27/20 0853     Subjective Pt went to [facility] for Christmas. Reports no falls or issues.    Pertinent History prior CVA affecting R side, HTN    Patient Stated Goals be more mobile, improve balance,  increase independence                [facility] PT Assessment - 12/27/20 0001       Assessment   Medical Diagnosis L MCA stroke    Referring Provider (PT) Sagardia    Onset Date/Surgical Date 12/01/20    Hand Dominance Right                           [facility] Adult PT Treatment/Exercise - 12/27/20 0001       Ambulation/Gait   Gait Comments No device. Cues for R arm swing and decreasing BOS      Knee/Hip Exercises: Aerobic   Recumbent Bike L1 x 5 min      Knee/Hip Exercises: Seated   Sit to Sand without UE support;2 sets;10 reps   staggered stance first with R foot forward and then L     Ankle Exercises: Standing   Heel Raises Both;20 reps    Toe Raise 20 reps  Balance Exercises - 12/27/20 0001       Balance Exercises: Standing   Standing Eyes Closed Foam/compliant surface;2 reps;30 secs    Tandem Stance Eyes open;30 secs;2 reps    SLS with Vectors Solid surface;5 reps    Standing, One Foot on a Step Eyes open;2 inch;5 reps;10 secs    Retro Gait 5 reps                  PT Short Term Goals - 12/21/20 1611       PT SHORT TERM GOAL #1   Title Pt will be I with initial HEP    Time 2    Period Weeks    Status New               PT Long Term Goals - 12/21/20 1612       PT LONG TERM GOAL #1   Title Pt will demo improved Berg Balance score to 52/56 to demo decreased fall risk    Baseline 42/56    Time 12    Period Weeks    Status New      PT LONG TERM GOAL #2   Title Pt will demo 5x STS <15 seconds with no UE assist    Baseline 21 seconds with occasional UE assist and LEs intermittently locking on back of chair    Time 12    Status New      PT LONG TERM GOAL #3   Title  Pt will demo gait >1000 ft over level/unlevel surfaces with no AD and supervision assist    Time 12    Period Weeks    Status New      PT LONG TERM GOAL #4   Title Pt will report able to safely return to gym routine at [facility]    Time 12    Period Weeks    Status New      PT LONG TERM GOAL #5   Title incresae LE strength to 4+/5    Time 12    Period Weeks    Status New                   Plan - 12/27/20 0857     Clinical Impression Statement Treatment focused on strengthening and working on balance. Difficulty with maintaining tandem stance with R LE back. Difficulty performing R LE forward step taps without utilizing wide BOS. Worked on gait training to improve arm swing and utilizing narrower BOS for reduced ataxia.    Personal Factors and Comorbidities Comorbidity 2    Comorbidities hx of CVA, HTN    Examination-Activity Limitations Stand;Locomotion Level    Examination-Participation Restrictions Community Activity;Interpersonal Relationship    Stability/Clinical Decision Making Evolving/Moderate complexity    Rehab Potential Good    PT Frequency 2x / week    PT Duration 12 weeks    PT Treatment/Interventions ADLs/Self Care Home Management;Gait training;Stair training;Functional mobility training;Therapeutic activities;Therapeutic exercise;Balance training;Neuromuscular re-education;Manual techniques;Patient/family education    PT Next Visit Plan work on balance, functional gait, safety, strength    PT Home Exercise Plan Access Code: [email or web address]    Consulted and Agree with Plan of Care Patient             Patient will benefit from skilled therapeutic intervention in order to improve the following deficits and impairments:  Difficulty walking, Abnormal gait, Impaired UE functional use, Decreased endurance, Cardiopulmonary status limiting activity, Decreased activity tolerance, Impaired vision/preception, Decreased balance, Postural dysfunction, Decreased  strength,  Decreased mobility  Visit Diagnosis: Other lack of coordination  Muscle weakness (generalized)  Balance problem  Difficulty in walking, not elsewhere classified  Other abnormalities of gait and mobility     Problem List Patient Active Problem List   Diagnosis Date Noted   Snoring 12/12/2020   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   Malnutrition of moderate degree 12/07/2020   Stroke (cerebrum) ([place]) 12/01/2020   Dizziness 08/30/2020   History of stroke 07/14/2020   Azotemia 07/11/2020   Anemia 07/11/2020   CVA (cerebral vascular accident) ([place]) 06/28/2020   Cerebral infarction ([place]) 06/25/2020   Acute left-sided weakness 06/24/2020   Vascular dementia with behavior disturbance 10/13/2019   Vascular dementia without behavioral disturbance ([place]) 07/15/2019   Cerebrovascular accident ([place]) 06/25/2019   Brain fog 04/22/2019   History of COVID-19 04/22/2019   Essential hypertension 03/03/2019   Hypertensive urgency 03/03/2019    [facility] April Gordy Levan, PT, DPT 12/27/2020, 10:17 AM  [place]. [place], [name], [address] Phone: [phone]   Fax:  [phone]  Name: Jacqueline Bonilla MRN: [id number] Date of Birth: Apr 12, 1955

## 2020-12-27 NOTE — Therapy (Signed)
[place]. [place], [name], [address] Phone: [phone]   Fax:  [phone]  Occupational Therapy Treatment  Patient Details  Name: Jacqueline Bonilla MRN: [id number] Date of Birth: 1955-06-25 Referring Provider (OT): Lauraine Rinne, PA-C   Encounter Date: 12/27/2020   OT End of Session - 12/27/20 0931     Visit Number 3    Number of Visits 17    Date for OT Re-Evaluation 03/20/21    Authorization Type [employer]    Authorization Time Period --    Authorization - Visit Number 3    Authorization - Number of Visits 17    Progress Note Due on Visit 10    OT Start Time 0930    OT Stop Time 1012    OT Time Calculation (min) 42 min    Activity Tolerance Patient tolerated treatment well    Behavior During Therapy [facility] for tasks assessed/performed            Past Medical History:  Diagnosis Date   Anemia    Anxiety    Depression    Dysmenorrhea    Endometriosis    Fibroid    Hypertension    Microhematuria    negative workup   Osteoporosis    Tachycardia    Thrombocytopenia ([place])     Past Surgical History:  Procedure Laterality Date   BREAST BIOPSY     BUBBLE STUDY  12/08/2020   Procedure: BUBBLE STUDY;  Surgeon: Josue Hector, MD;  Location: [place];  Service: Cardiovascular;;   CESAREAN SECTION     hysteroscopic resection     implantable loop recorder implant  10/21/2019   [employer] Reveal Linq model LNQ 22 ([place] [id number]) implantable loop recorder   IR CT HEAD LTD  12/01/2020   IR PERCUTANEOUS ART THROMBECTOMY/INFUSION INTRACRANIAL INC DIAG ANGIO  12/01/2020   IR US GUIDE VASC ACCESS RIGHT  12/01/2020   RADIOLOGY WITH ANESTHESIA N/A 12/01/2020   Procedure: IR WITH ANESTHESIA - CODE STROKE;  Surgeon: Radiologist, Medication, MD;  Location: [place];  Service: Radiology;  Laterality: N/A;   TEE WITHOUT CARDIOVERSION N/A 12/08/2020   Procedure: TRANSESOPHAGEAL ECHOCARDIOGRAM (TEE);   Surgeon: Josue Hector, MD;  Location: [facility] ENDOSCOPY;  Service: Cardiovascular;  Laterality: N/A;    There were no vitals filed for this visit.   Subjective Assessment - 12/27/20 0930     Subjective  "I like it when you explain to me what I'm doing wrong, because that's exactly what I was doing"    Pertinent History Cerebral infarction 12/01/20; MRI indicated small acute infarcts in L MCA distribution and SAH overlying bilateral cerebral hemispheres. PMH includes 2 prior strokes in June 2021 (R-sided weakness) and June 2022 (L-sided weakness); vascular dementia; HTN; loop recorder implant 10/2019; osteoporosis; thrombocytopenia; anxiety; anemia    Patient Stated Goals "Anything I can to get better"    Currently in Pain? No/denies             Treatment/Exercises - 12/27/20    Pegboard Activity  Copying simple pattern onto small pegboard w/ easy-grip pegs to facilitate visual perception and spatial orientation, alternating attention, [place], and hand strengthening; pt able to complete activity w/ Mod A for pattern recognition. OT provided add'l cues to decrease compensating for decreased [place] using L hand and tabletop to assist w/ rotation of pegs vs incorporating in-hand manipulation; OT repositioned pegs on tabletop to grade activity down w/ fair improvement.  Pattern Recognition Simple color sequencing activity to address pattern recognition and visual perception. After significant breakdown of task, pt was able to complete 2-color pattern continuation in 2/4 trials w/ Mod I, requiring extended time for processing and 2/4 trials w/ Min A for problem-solving and correction of errors  Picking up and threading differently sized beads (round, cylindrical, pony) onto string w/ to facilitate [facility], precision pinch, in-hand manipulation, visual perception, and continued pattern recognition. Min A to contintue pattern w/ OT providing breakdown of task and verbal cues for recognition of errors; OT also  provided assist w/ problem-solving w/ pt experienced difficulty threading smallest pony beads onto thread. When threading beads, pt stabilizing string w/ L hand and manipulated beads w/ R hand.            OT Short Term Goals - 12/21/20 1451       OT SHORT TERM GOAL #1   Title Pt will improve participation in manipulation of clothing fasteners as evidenced by completing 9-Hole Peg Test w/ less than 5 drops using R, dominant hand    Baseline R hand 2 min, 20 sec w/ 7 drops    Time 4    Period Weeks    Status On-going    Target Date 01/17/21      OT SHORT TERM GOAL #2   Title Pt will improve safety w/ functional overhead reach by increasing strength of R shoulder flexion to at least 4-/5    Baseline 3+/5 (full ROM against gravity; slight resistance)    Time 4    Period Weeks    Status On-going    Target Date 01/17/21      OT SHORT TERM GOAL #3   Title Pt will demonstrate ability to thread first sleeve into jacket to improve initiation of task and independence w/ BADLs    Baseline Min A to initiate task    Time 4    Period Weeks    Status On-going    Target Date 01/17/21             OT Long Term Goals - 12/21/20 1451       OT LONG TERM GOAL #1   Title Pt will demonstrate independence w/ full HEP designed for BUE strengthening, GM/FMC, and coordination by discharge    Baseline No HEP at this time    Time 8    Period Weeks    Status On-going    Target Date 02/14/21      OT LONG TERM GOAL #2   Title Pt will improve RUE GMC and coordination for improved participation in ADLs as evidenced by increasing Box and Blocks score by at least 5 blocks    Baseline TBA    Time 8    Period Weeks    Status On-going    Target Date 02/14/21      OT LONG TERM GOAL #3   Title Pt will be able to thread zipper w/ Mod I, using AE prn, in at least 1 attempt    Baseline Unable to thread zipper    Time 8    Period Weeks    Status On-going    Target Date 02/14/21      OT LONG TERM  GOAL #4   Title Pt will be able to complete simulated self-feeding w/ Mod I (scooping, bringing utensil to mouth, cutting food, etc.) using AE prn    Baseline Reports difficulty manipulating utensils    Time 8    Period Weeks  Status On-going    Target Date 02/14/21      OT LONG TERM GOAL #5   Title Pt will be able to don socks w/ Mod I, using AE prn, in at least 1 attempt by d/c    Baseline Needs assist w/ donning socks    Time 8    Period Weeks    Status On-going    Target Date 02/14/21             Plan - 12/27/20 9622     Clinical Impression Statement OT attempted to address NMR of [place] and coordination w/ additional visual perceptual/spatial orientation component. Pt demonstrated initial understanding of top border, L to R pattern w/ significant difficulty then continuing pattern around the full square. Considering visual perceptual hierarchy, OT graded task demand down to isolate pattern recognition and pt required Min A for success w/ understanding and accuracy improving w/ repetition. OT then returned to incorporating [facility] component into task via threading beads following a simple pattern and pt was able to complete activity successfully w/ Mod A (verbal cues/facilitation, monitoring for correction of errors). Session activities indicated difficulty w/ pattern recognition impacting success w/ visual memory and visual cognition.    OT Occupational Profile and History Detailed Assessment- Review of Records and additional review of physical, cognitive, psychosocial history related to current functional performance    Occupational performance deficits (Please refer to evaluation for details): ADL's;IADL's;Rest and Sleep;Leisure;Social Participation    Body Structure / Function / Physical Skills ADL;Decreased knowledge of use of DME;Gait;Strength;Balance;Dexterity;GMC;Tone;Body mechanics;Proprioception;UE functional use;IADL;Endurance;ROM;Vision;Coordination;Mobility;Sensation;FMC     Cognitive Skills Memory;Problem Solve;Safety Awareness;Sequencing    Rehab Potential Fair   Due to comorbidities   Clinical Decision Making Several treatment options, min-mod task modification necessary    Comorbidities Affecting Occupational Performance: Presence of comorbidities impacting occupational performance    Comorbidities impacting occupational performance description: Vascular dementia; anxiety    Modification or Assistance to Complete Evaluation  Min-Moderate modification of tasks or assist with assess necessary to complete eval    OT Frequency 2x / week    OT Duration 8 weeks    OT Treatment/Interventions Self-care/ADL training;Aquatic Therapy;Electrical Stimulation;Moist Heat;Cryotherapy;Therapeutic exercise;Neuromuscular education;Energy conservation;Manual Therapy;Functional Mobility Training;DME and/or AE instruction;Passive range of motion;Patient/family education;Visual/perceptual remediation/compensation;Cognitive remediation/compensation;Therapeutic activities;Splinting;Balance training    Plan Closed-chain shoulder strengthening w/ facilitation to decrease elbow flexion; gross pinch and release activities w/ targeting for visual perception    Recommended Other Services Currently receiving ST and PT services at this location    Consulted and Agree with Plan of Care Patient            Patient will benefit from skilled therapeutic intervention in order to improve the following deficits and impairments:   Body Structure / Function / Physical Skills: ADL, Decreased knowledge of use of DME, Gait, Strength, Balance, Dexterity, GMC, Tone, Body mechanics, Proprioception, UE functional use, IADL, Endurance, ROM, Vision, Coordination, Mobility, Sensation, [facility] Cognitive Skills: Memory, Problem Solve, Safety Awareness, Sequencing   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 [facility])  Other lack of coordination  Visuospatial deficit  Frontal lobe and executive function  deficit  Muscle weakness (generalized)   Problem List Patient Active Problem List   Diagnosis Date Noted   Snoring 12/12/2020   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   Malnutrition of moderate degree 12/07/2020   Stroke (cerebrum) ([place]) 12/01/2020   Dizziness 08/30/2020   History of stroke 07/14/2020   Azotemia 07/11/2020   Anemia 07/11/2020   CVA (cerebral vascular accident) (  [place]) 06/28/2020   Cerebral infarction ([place]) 06/25/2020   Acute left-sided weakness 06/24/2020   Vascular dementia with behavior disturbance 10/13/2019   Vascular dementia without behavioral disturbance ([place]) 07/15/2019   Cerebrovascular accident ([place]) 06/25/2019   Brain fog 04/22/2019   History of COVID-19 04/22/2019   Essential hypertension 03/03/2019   Hypertensive urgency 03/03/2019    Kathrine Cords, OTR/L, MSOT 12/27/2020, 9:39 AM  [place] [place], [name], [address] Phone: [phone]   Fax:  [phone]  Name: LILLYN WIECZOREK MRN: [id number] Date of Birth: 10-12-1955

## 2020-12-28 ENCOUNTER — Ambulatory Visit (INDEPENDENT_AMBULATORY_CARE_PROVIDER_SITE_OTHER): Payer: Medicare HMO | Admitting: Emergency Medicine

## 2020-12-28 VITALS — BP 122/68 | HR 78 | Ht 64.0 in | Wt 115.0 lb

## 2020-12-28 DIAGNOSIS — Z09 Encounter for follow-up examination after completed treatment for conditions other than malignant neoplasm: Secondary | ICD-10-CM | POA: Diagnosis not present

## 2020-12-28 DIAGNOSIS — F0150 Vascular dementia without behavioral disturbance: Secondary | ICD-10-CM

## 2020-12-28 DIAGNOSIS — Z8673 Personal history of transient ischemic attack (TIA), and cerebral infarction without residual deficits: Secondary | ICD-10-CM

## 2020-12-28 DIAGNOSIS — E785 Hyperlipidemia, unspecified: Secondary | ICD-10-CM | POA: Diagnosis not present

## 2020-12-28 DIAGNOSIS — I10 Essential (primary) hypertension: Secondary | ICD-10-CM

## 2020-12-28 NOTE — Progress Notes (Signed)
Jacqueline Bonilla 65 y.o.   Chief Complaint  Patient presents with   Hospitalization Follow-up    HISTORY OF PRESENT ILLNESS: This is a 65 y.o. female here for hospital discharge follow-up.  Doing well.  Slowly improving. Hospital discharge summary as follows:  Hospital Course: Jacqueline Bonilla was admitted to rehab 12/08/2020 for inpatient therapies to consist of PT, ST and OT at least three hours five days a week. Past admission physiatrist, therapy team and rehab RN have worked together to provide customized collaborative inpatient rehab.  Pertaining to patient's left distal M2-M3 occlusion status post TN K revascularization complicated by small SAH with TN K reversal etiology recurrent cryptogenic shock small acute infarct left MCA distribution as well as CVA x2 in the past.  Patient remained on aspirin and Brilinta as advised and would follow-up neurology services.  Pain managed with use of Lidoderm patch as directed.  Blood pressure controlled on Cozaar and would need outpatient follow-up.  Keppra for seizure prophylaxis EEG negative.  Lipitor for hyperlipidemia.  History of thrombocytopenia latest platelet count 230,000.  Bouts of urinary retention initially maintained on Urecholine and Flomax voiding without difficulty.  Rehab course: During patient's stay in rehab weekly team conferences were held to monitor patient's progress, set goals and discuss barriers to discharge. At admission, patient required minimal assist 50 feet rolling walker minimal assist sit to stand He/She  has had improvement in activity tolerance, balance, postural control as well as ability to compensate for deficits. He/She has had improvement in functional use RUE/LUE  and RLE/LLE as well as improvement in awareness.  Ambulatory to therapy gym with contact-guard no assistive device.  Demonstrated improved balance and coordination.  Patient bathroom minimal assist clothing management needing assist for hygiene.  Work  with lower body dressing edge of bed minimal assist and moderate assist demonstration of cueing secondary to placing the right lower extremity in the left leg opening.  She was able to correct with therapist cueing.  Needed max assist to complete harder design peg puzzles.  SLP educated on word finding strategies using semantic feature analysis in which patient executed with supervision assist.  Full family teaching completed plan discharged home    Follow-up with neurology services in regards to timeline of using aspirin and Brilinta   Medications at discharge 1.  Tylenol as needed 2.  Aspirin 81 mg p.o. daily 3.  Lipitor 40 mg p.o. daily 4.  Vitamin D 1000 units p.o. daily 5.  Keppra 500 mg p.o. twice daily 6.  Lidoderm patch 2 patches change as directed 7.  Cozaar 12.5 mg p.o. daily 8.  Multivitamin daily 9.  Protonix 40 mg p.o. daily 10.  Brilinta 90 mg p.o. twice daily 11.  Thiamine 100 mg p.o. daily 12.  Melatonin 5 mg nightly as needed HPI   Prior to Admission medications   Medication Sig Start Date End Date Taking? Authorizing Provider  acetaminophen (TYLENOL) 325 MG tablet Take 2 tablets (650 mg total) by mouth every 4 (four) hours as needed for mild pain (or temp > 37.5 C (99.5 F)). 12/15/20  Yes Angiulli, Lavon Paganini, PA-C  aspirin EC 81 MG tablet Take 81 mg by mouth daily. Swallow whole.   Yes [provider]  atorvastatin (LIPITOR) 40 MG tablet Take 1 tablet (40 mg total) by mouth daily. 12/15/20  Yes Angiulli, Lavon Paganini, PA-C  cholecalciferol (VITAMIN D) 25 MCG tablet Take 1 tablet (1,000 Units total) by mouth daily. 12/15/20  Yes Lauraine Rinne  J, PA-C  levETIRAcetam (KEPPRA) 500 MG tablet Take 1 tablet (500 mg total) by mouth 2 (two) times daily. 12/15/20  Yes Angiulli, Lavon Paganini, PA-C  lidocaine (LIDODERM) 5 % Place 2 patches onto the skin daily. Remove & Discard patch within 12 hours or as directed by MD 12/15/20  Yes Angiulli, Lavon Paganini, PA-C  losartan (COZAAR) 25  MG tablet Take 0.5 tablets (12.5 mg total) by mouth daily. 12/15/20  Yes Angiulli, Lavon Paganini, PA-C  melatonin 5 MG TABS Take 1 tablet (5 mg total) by mouth at bedtime as needed. 12/15/20  Yes Angiulli, Lavon Paganini, PA-C  Multiple Vitamin (MULTIVITAMIN WITH MINERALS) TABS tablet Take 1 tablet by mouth daily.   Yes [provider]  Multiple Vitamins-Minerals (ZINC PO) Take 1 tablet by mouth daily.   Yes [provider]  pantoprazole (PROTONIX) 40 MG tablet Take 1 tablet (40 mg total) by mouth daily. 12/15/20  Yes Angiulli, Lavon Paganini, PA-C  thiamine 100 MG tablet Take 1 tablet (100 mg total) by mouth daily. 12/15/20  Yes Angiulli, Lavon Paganini, PA-C  ticagrelor (BRILINTA) 90 MG TABS tablet Take 1 tablet (90 mg total) by mouth 2 (two) times daily. 12/15/20  Yes Angiulli, Lavon Paganini, PA-C  Turmeric (QC TUMERIC COMPLEX PO) Take 1 tablet by mouth daily.   Yes [provider]    Allergies  Allergen Reactions   Avelox [Moxifloxacin]    Ciprofloxacin Hives   Floraquin [Iodoquinol]     No cipro or others   Iohexol      Code: HIVES, Desc: HIVES S/P IVP MANY YRS AGO    Levaquin [Levofloxacin]    Plavix [Clopidogrel] Diarrhea   Quinolones    Shellfish Allergy Itching    Itching under the chin. Described by patient but not seen by husband.    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   Malnutrition of moderate degree 12/07/2020   Stroke (cerebrum) ([place]) 12/01/2020   Dizziness 08/30/2020   History of stroke 07/14/2020   Azotemia 07/11/2020   Anemia 07/11/2020   CVA (cerebral vascular accident) ([place]) 06/28/2020   Cerebral infarction ([place]) 06/25/2020   Acute left-sided weakness 06/24/2020   Vascular dementia with behavior disturbance 10/13/2019   Vascular dementia without behavioral disturbance ([place]) 07/15/2019   Cerebrovascular accident ([place]) 06/25/2019   Brain fog 04/22/2019   History of  COVID-19 04/22/2019   Essential hypertension 03/03/2019   Hypertensive urgency 03/03/2019    Past Medical History:  Diagnosis Date   Anemia    Anxiety    Depression    Dysmenorrhea    Endometriosis    Fibroid    Hypertension    Microhematuria    negative workup   Osteoporosis    Tachycardia    Thrombocytopenia ([place])     Past Surgical History:  Procedure Laterality Date   BREAST BIOPSY     BUBBLE STUDY  12/08/2020   Procedure: BUBBLE STUDY;  Surgeon: Josue Hector, MD;  Location: [place];  Service: Cardiovascular;;   CESAREAN SECTION     hysteroscopic resection     implantable loop recorder implant  10/21/2019   Medtronic Reveal Linq model LNQ 22 ([place] [id number]) implantable loop recorder   IR CT HEAD LTD  12/01/2020   IR PERCUTANEOUS ART THROMBECTOMY/INFUSION INTRACRANIAL INC DIAG ANGIO  12/01/2020   IR US GUIDE VASC ACCESS RIGHT  12/01/2020   RADIOLOGY WITH ANESTHESIA N/A 12/01/2020  Procedure: IR WITH ANESTHESIA - CODE STROKE;  Surgeon: Radiologist, Medication, MD;  Location: [facility];  Service: Radiology;  Laterality: N/A;   TEE WITHOUT CARDIOVERSION N/A 12/08/2020   Procedure: TRANSESOPHAGEAL ECHOCARDIOGRAM (TEE);  Surgeon: Josue Hector, MD;  [facility] END[facility]COPY;  Service: Cardiovascular;  Laterality: N/A;    Social History   Socioeconomic History   Marital status: Married    Spouse name: Psychologist, prison and probation services   Number of children: 1   Years of education: Not on file   Highest education level: Not on file  Occupational History   Occupation: accounting  Tobacco Use   Smoking status: Never   Smokeless tobacco: Never  Vaping Use   Vaping Use: Never used  Substance and Sexual Activity   Alcohol use: Never   Drug use: Never   Sexual activity: Not Currently    Partners: Male    Comment: husband vasectomy  Other Topics Concern   Not on file  Social History Narrative   Lives with husband   Grandchildren - 1   Works - Investment banker, 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%   Gun in home -  yes - secured      Right handed   Caffeine: maybe tea every now and then   Social Determinants of Radio broadcast assistant Strain: Low Risk    Difficulty of Paying Living Expenses: Not hard at all  Food Insecurity: No Food Insecurity   Worried About Charity fundraiser in the Last Year: Never true   Arboriculturist in the Last Year: Never true  Transportation Needs: No Transportation Needs   Lack of Transportation (Medical): No   Lack of Transportation (Non-Medical): No  Physical Activity: Sufficiently Active   Days of Exercise per Week: 5 days   Minutes of Exercise per Session: 30 min  Stress: No Stress Concern Present   Feeling of Stress : Not at all  Social Connections: Not on file  Intimate Partner Violence: Not At Risk   Fear of Current or Ex-Partner: No   Emotionally Abused: No   Physically Abused: No   Sexually Abused: No    Family History  Problem Relation Age of Onset   Cancer Father        non hodgkin lymphoma & skin   Heart attack Maternal Grandfather    Dementia Mother    Polymyositis Sister      Review of Systems  Constitutional: Negative.  Negative for chills and fever.  HENT: Negative.  Negative for congestion and sore throat.   Eyes: Negative.  Negative for blurred vision and double vision.  Respiratory: Negative.  Negative for cough and shortness of breath.   Cardiovascular: Negative.  Negative for chest pain and palpitations.  Gastrointestinal: Negative.  Negative for abdominal pain, blood in stool, diarrhea, melena, nausea and vomiting.  Genitourinary: Negative.  Negative for dysuria and hematuria.  Skin: Negative.  Negative for rash.  Neurological:  Negative for dizziness and headaches.  All other systems reviewed and are negative.  Today's Vitals   12/28/20 1514  BP: 122/68  Pulse: 78  SpO2: 98%  Weight: 115 lb (52.2 kg)  Height: 5\' 4"  (1.626 m)   Body mass index is 19.74 kg/m.  Physical Exam Vitals reviewed.  Constitutional:       Appearance: Normal appearance.  HENT:     Head: Normocephalic.     Mouth/Throat:     Mouth: Mucous membranes are moist.     Pharynx: Oropharynx  is clear.  Eyes:     Extraocular Movements: Extraocular movements intact.     Pupils: Pupils are equal, round, and reactive to light.  Cardiovascular:     Rate and Rhythm: Normal rate and regular rhythm.     Pulses: Normal pulses.     Heart sounds: Normal heart sounds. No murmur heard. Pulmonary:     Effort: Pulmonary effort is normal.     Breath sounds: Normal breath sounds.  Abdominal:     Palpations: Abdomen is soft.     Tenderness: There is no abdominal tenderness.  Musculoskeletal:     Cervical back: Neck supple. No tenderness.  Lymphadenopathy:     Cervical: No cervical adenopathy.  Skin:    General: Skin is warm and dry.  Neurological:     Mental Status: She is alert and oriented to person, place, and time.     Cranial Nerves: Cranial nerves 2-12 are intact.     Motor: Weakness (Right upper extremity 4/5) present.     Coordination: Coordination normal. Finger-Nose-Finger Test normal.     Gait: Gait abnormal.  Psychiatric:        Mood and Affect: Mood normal.        Behavior: Behavior normal.     ASSESSMENT & PLAN: A total of 51 minutes was spent with the patient and counseling/coordination of care regarding preparing for this visit, review of hospital discharge summary, review of all medications, review of most recent blood work results, review of most recent imaging reports, discussion of stroke prevention measures, fall precautions, long-term anticoagulation strategy, importance of nutrition and staying well-hydrated, prognosis, documentation and need for follow-up.  Problem List Items Addressed This Visit       Cardiovascular and Mediastinum   Essential hypertension    Well-controlled hypertension.  Continue losartan 12.5 mg daily.        Nervous and Auditory   Vascular dementia without behavioral disturbance (HCC)      Other   History of stroke    Stroke prevention measures discussed. Continue aspirin 81 mg daily and Brilinta 90 mg twice daily Follow-up with neurologist as scheduled next month      Dyslipidemia    Importance of cholesterol management for stroke prevention discussed. Continue Lipitor 40 mg daily.      Other Visit Diagnoses     Hospital discharge follow-up    -  Primary      Patient Instructions  Cognitive Rehabilitation After a Stroke A stroke is caused by not getting enough blood flow to the brain. This can affect a person's thinking and memory. Rehabilitation is important for improving these areas. It can help with attention span, decision-making (executive function), and with the ability to recall information, such as words and objects. It also can help with language and perception. Rehabilitation can help to improve quality of life. What is cognitive rehabilitation? Cognitive rehabilitation is a program to help you improve your thinking skills after a stroke. It can help with memory, problem-solving, and communication skills. Therapy focuses on: Improving brain function. This may involve learning to break down tasks into simple steps. Coping with thinking problems. You might learn ways to help improve your memory or do activities that stimulate memory. These may include naming objects or describing pictures. Types of rehabilitation Cognitive rehabilitation refers to a group of therapies and may include: Speech-language therapy to help you understand and communicate better. Occupational therapy to help you with daily activities. Music therapy to help with stress, anxiety, and depression.  This may involve listening to music, singing, or playing instruments. Physical therapy to help you get stronger and move better. These therapies may involve: Helping you to learn ways to cope with memory problems, such as setting alarms to remind you to take medicines. You also may learn  new strategies to help your memory. Using virtual reality or video games to help you remember words, names of objects, and other things. Exercise to help increase blood flow to the brain. Summary After a stroke, some people have problems with thinking, memory, language, communication, and problem-solving. Cognitive rehabilitation is a program to help you regain brain function and learn skills to cope with thinking problems. Rehabilitation can help to improve quality of life. Cognitive rehabilitation may include speech-language therapy, occupational therapy, music therapy, and physical therapy. This information is not intended to replace advice given to you by your health care provider. Make sure you discuss any questions you have with your health care provider. Document Revised: 11/20/2019 Document Reviewed: 11/20/2019 Elsevier Patient Education  2022 [place], [place] [facility]

## 2020-12-28 NOTE — Patient Instructions (Signed)
Cognitive Rehabilitation After a Stroke A stroke is caused by not getting enough blood flow to the brain. This can affect a person's thinking and memory. Rehabilitation is important for improving these areas. It can help with attention span, decision-making (executive function), and with the ability to recall information, such as words and objects. It also can help with language and perception. Rehabilitation can help to improve quality of life. What is cognitive rehabilitation? Cognitive rehabilitation is a program to help you improve your thinking skills after a stroke. It can help with memory, problem-solving, and communication skills. Therapy focuses on: Improving brain function. This may involve learning to break down tasks into simple steps. Coping with thinking problems. You might learn ways to help improve your memory or do activities that stimulate memory. These may include naming objects or describing pictures. Types of rehabilitation Cognitive rehabilitation refers to a group of therapies and may include: Speech-language therapy to help you understand and communicate better. Occupational therapy to help you with daily activities. Music therapy to help with stress, anxiety, and depression. This may involve listening to music, singing, or playing instruments. Physical therapy to help you get stronger and move better. These therapies may involve: Helping you to learn ways to cope with memory problems, such as setting alarms to remind you to take medicines. You also may learn new strategies to help your memory. Using virtual reality or video games to help you remember words, names of objects, and other things. Exercise to help increase blood flow to the brain. Summary After a stroke, some people have problems with thinking, memory, language, communication, and problem-solving. Cognitive rehabilitation is a program to help you regain brain function and learn skills to cope with thinking  problems. Rehabilitation can help to improve quality of life. Cognitive rehabilitation may include speech-language therapy, occupational therapy, music therapy, and physical therapy. This information is not intended to replace advice given to you by your health care provider. Make sure you discuss any questions you have with your health care provider. Document Revised: 11/20/2019 Document Reviewed: 11/20/2019 Elsevier Patient Education  2022 [employer].

## 2020-12-29 ENCOUNTER — Other Ambulatory Visit: Payer: Self-pay

## 2020-12-29 ENCOUNTER — Ambulatory Visit: Payer: Medicare HMO | Admitting: Physical Therapy

## 2020-12-29 ENCOUNTER — Ambulatory Visit: Payer: Medicare HMO | Admitting: Speech Pathology

## 2020-12-29 ENCOUNTER — Encounter: Payer: Self-pay | Admitting: Physical Therapy

## 2020-12-29 ENCOUNTER — Encounter: Payer: Self-pay | Admitting: Emergency Medicine

## 2020-12-29 ENCOUNTER — Encounter: Payer: Self-pay | Admitting: Occupational Therapy

## 2020-12-29 ENCOUNTER — Encounter: Payer: Self-pay | Admitting: Speech Pathology

## 2020-12-29 ENCOUNTER — Ambulatory Visit: Payer: Medicare HMO | Admitting: Occupational Therapy

## 2020-12-29 DIAGNOSIS — R262 Difficulty in walking, not elsewhere classified: Secondary | ICD-10-CM

## 2020-12-29 DIAGNOSIS — M6281 Muscle weakness (generalized): Secondary | ICD-10-CM

## 2020-12-29 DIAGNOSIS — R2689 Other abnormalities of gait and mobility: Secondary | ICD-10-CM | POA: Diagnosis not present

## 2020-12-29 DIAGNOSIS — R278 Other lack of coordination: Secondary | ICD-10-CM

## 2020-12-29 DIAGNOSIS — I63512 Cerebral infarction due to unspecified occlusion or stenosis of left middle cerebral artery: Secondary | ICD-10-CM | POA: Diagnosis not present

## 2020-12-29 DIAGNOSIS — R41842 Visuospatial deficit: Secondary | ICD-10-CM

## 2020-12-29 DIAGNOSIS — R41844 Frontal lobe and executive function deficit: Secondary | ICD-10-CM | POA: Diagnosis not present

## 2020-12-29 DIAGNOSIS — E785 Hyperlipidemia, unspecified: Secondary | ICD-10-CM | POA: Insufficient documentation

## 2020-12-29 DIAGNOSIS — R41841 Cognitive communication deficit: Secondary | ICD-10-CM | POA: Diagnosis not present

## 2020-12-29 DIAGNOSIS — R4701 Aphasia: Secondary | ICD-10-CM | POA: Diagnosis not present

## 2020-12-29 NOTE — Assessment & Plan Note (Signed)
Stroke prevention measures discussed. Continue aspirin 81 mg daily and Brilinta 90 mg twice daily Follow-up with neurologist as scheduled next month

## 2020-12-29 NOTE — Therapy (Signed)
[address] [place], [name], [address] Phone: [phone]   Fax:  [phone]  Physical Therapy Treatment  Patient Details  Name: Jacqueline Bonilla MRN: [id number] Date of Birth: [birth year] Referring Provider (PT): Sibley   Encounter Date: 12/29/2020   PT End of Session - 12/29/20 0928     Visit Number 3    Number of Visits 12    Date for PT Re-Evaluation 02/03/21    PT Start Time 0845    PT Stop Time 0928    PT Time Calculation (min) 43 min    Activity Tolerance Patient tolerated treatment well    Behavior During Therapy [facility] for tasks assessed/performed             Past Medical History:  Diagnosis Date   Anemia    Anxiety    Depression    Dysmenorrhea    Endometriosis    Fibroid    Hypertension    Microhematuria    negative workup   Osteoporosis    Tachycardia    Thrombocytopenia ([place])     Past Surgical History:  Procedure Laterality Date   BREAST BIOPSY     BUBBLE STUDY  12/08/2020   Procedure: BUBBLE STUDY;  Surgeon: Josue Hector, MD;  Location: [place];  Service: Cardiovascular;;   CESAREAN SECTION     hysteroscopic resection     implantable loop recorder implant  10/21/2019   Medtronic Reveal Linq model LNQ 22 ([place] [id number]) implantable loop recorder   IR CT HEAD LTD  12/01/2020   IR PERCUTANEOUS ART THROMBECTOMY/INFUSION INTRACRANIAL INC DIAG ANGIO  12/01/2020   IR US GUIDE VASC ACCESS RIGHT  12/01/2020   RADIOLOGY WITH ANESTHESIA N/A 12/01/2020   Procedure: IR WITH ANESTHESIA - CODE STROKE;  Surgeon: Radiologist, Medication, MD;  Location: [place];  Service: Radiology;  Laterality: N/A;   TEE WITHOUT CARDIOVERSION N/A 12/08/2020   Procedure: TRANSESOPHAGEAL ECHOCARDIOGRAM (TEE);  Surgeon: Josue Hector, MD;  Location: [facility] ENDOSCOPY;  Service: Cardiovascular;  Laterality: N/A;    There were no vitals filed for this visit.   Subjective Assessment - 12/29/20 0848      Subjective Reports no falls, reports saw the MD yesterday and he thought I was doing well    Currently in Pain? No/denies                               [facility] Adult PT Treatment/Exercise - 12/29/20 0001       High Level Balance   High Level Balance Activities Side stepping;Backward walking;Direction changes;Head turns    High Level Balance Comments on airex ball toss, side stepping over objects, cone toe touches, on airex cone toe touches, side step on and off airex, walking ball toss      Exercises   Exercises Knee/Hip      Knee/Hip Exercises: Aerobic   Recumbent Bike L3 x 5 min    Nustep level 4 x 5 minutes      Knee/Hip Exercises: Standing   Hip Flexion Both;10 reps    Hip Abduction Both;2 sets;10 reps      Knee/Hip Exercises: Seated   Other Seated Knee/Hip Exercises sit to stand with weighted ball reach,  then did it on the airex      Ankle Exercises: Standing   Heel Raises Both;20 reps    Toe Raise 20 reps  PT Short Term Goals - 12/21/20 1611       PT SHORT TERM GOAL #1   Title Pt will be I with initial HEP    Time 2    Period Weeks    Status New               PT Long Term Goals - 12/29/20 0931       PT LONG TERM GOAL #1   Title Pt will demo improved Berg Balance score to 52/56 to demo decreased fall risk    Status On-going      PT LONG TERM GOAL #2   Title Pt will demo 5x STS <15 seconds with no UE assist    Status On-going                   Plan - 12/29/20 0929     Clinical Impression Statement with high level balance she struggles at first especially with dynamic surfaces, But once she gets going she really does well, a few times needed CGA for LOB but was not often  Seemed to have some issues with vision especially if ball was thrown high or to side, was unable to follow    PT Next Visit Plan work on balance, functional gait, safety, strength    Consulted and Agree with Plan of Care  Patient             Patient will benefit from skilled therapeutic intervention in order to improve the following deficits and impairments:  Difficulty walking, Abnormal gait, Impaired UE functional use, Decreased endurance, Cardiopulmonary status limiting activity, Decreased activity tolerance, Impaired vision/preception, Decreased balance, Postural dysfunction, Decreased strength, Decreased mobility  Visit Diagnosis: Other lack of coordination  Muscle weakness (generalized)  Balance problem  Difficulty in walking, not elsewhere classified  Other abnormalities of gait and mobility     Problem List Patient Active Problem List   Diagnosis Date Noted   Snoring 12/12/2020   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   Malnutrition of moderate degree 12/07/2020   Stroke (cerebrum) ([place]) 12/01/2020   Dizziness 08/30/2020   History of stroke 07/14/2020   Azotemia 07/11/2020   Anemia 07/11/2020   CVA (cerebral vascular accident) ([place]) 06/28/2020   Cerebral infarction ([place]) 06/25/2020   Acute left-sided weakness 06/24/2020   Vascular dementia with behavior disturbance 10/13/2019   Vascular dementia without behavioral disturbance ([place]) 07/15/2019   Cerebrovascular accident ([place]) 06/25/2019   Brain fog 04/22/2019   History of COVID-19 04/22/2019   Essential hypertension 03/03/2019   Hypertensive urgency 03/03/2019    Sumner Boast, PT 12/29/2020, 9:31 AM  [address] [place], [name], [address] Phone: [phone]   Fax:  [phone]  Name: Jacqueline Bonilla MRN: [id number] Date of Birth: January 09, 1955

## 2020-12-29 NOTE — Therapy (Signed)
[place] [place], [name], [address] Phone: [phone]   Fax:  [phone]  Occupational Therapy Treatment  Patient Details  Name: Jacqueline Bonilla MRN: [id number] Date of Birth: 1955/04/24 Referring Provider (OT): Lauraine Rinne, PA-C   Encounter Date: 12/29/2020   OT End of Session - 12/29/20 0935     Visit Number 4    Number of Visits 17    Date for OT Re-Evaluation 03/20/21    Authorization Type [employer]    Authorization - Visit Number 4    Authorization - Number of Visits 17    Progress Note Due on Visit 10    OT Start Time 0930    OT Stop Time 1010    OT Time Calculation (min) 40 min    Activity Tolerance Patient tolerated treatment well    Behavior During Therapy [facility] for tasks assessed/performed            Past Medical History:  Diagnosis Date   Anemia    Anxiety    Depression    Dysmenorrhea    Endometriosis    Fibroid    Hypertension    Microhematuria    negative workup   Osteoporosis    Tachycardia    Thrombocytopenia ([place])     Past Surgical History:  Procedure Laterality Date   BREAST BIOPSY     BUBBLE STUDY  12/08/2020   Procedure: BUBBLE STUDY;  Surgeon: Josue Hector, MD;  Location: [facility];  Service: Cardiovascular;;   CESAREAN SECTION     hysteroscopic resection     implantable loop recorder implant  10/21/2019   Medtronic Reveal Linq model LNQ 22 ([place] [id number]) implantable loop recorder   IR CT HEAD LTD  12/01/2020   IR PERCUTANEOUS ART THROMBECTOMY/INFUSION INTRACRANIAL INC DIAG ANGIO  12/01/2020   IR US GUIDE VASC ACCESS RIGHT  12/01/2020   RADIOLOGY WITH ANESTHESIA N/A 12/01/2020   Procedure: IR WITH ANESTHESIA - CODE STROKE;  Surgeon: Radiologist, Medication, MD;  Location: [facility];  Service: Radiology;  Laterality: N/A;   TEE WITHOUT CARDIOVERSION N/A 12/08/2020   Procedure: TRANSESOPHAGEAL ECHOCARDIOGRAM (TEE);  Surgeon: Josue Hector, MD;   Location: [facility] ENDOSCOPY;  Service: Cardiovascular;  Laterality: N/A;    There were no vitals filed for this visit.   Subjective Assessment - 12/29/20 0933     Subjective  Pt reports she went to her PCP yesterday who said he believes she is doing well    Pertinent History Cerebral infarction 12/01/20; MRI indicated small acute infarcts in L MCA distribution and SAH overlying bilateral cerebral hemispheres. PMH includes 2 prior strokes in June 2021 (R-sided weakness) and June 2022 (L-sided weakness); vascular dementia; HTN; loop recorder implant 10/2019; osteoporosis; thrombocytopenia; anxiety; anemia    Patient Stated Goals "Anything I can to get better"    Currently in Pain? No/denies             Treatment/Exercises - 12/29/20    Shoulder Exercises  Closed-chain BUE light strengthening w/ 2 lb dumbbell for NMR of RUE GMC; completed 2 sets of 15 reps w/ OT providing mod verbal and tactile cues/facilitation for alignment and positioning, emphasizing elbow extension  Overhead reach to tap small weighted (4.4 lb) ball at head height and overhead height; completed 15x w/ OT providing verbal cues for bilateral integration/to increase incorporation of RUE and tap ball at midline w/ fair success  Activity modified to completing tapping at 12, 3, 6, and 9  o'clock using 2 lb dumbbell w/ RUE only to prevent compensatory patterns observed when activity initially attempted holding a weighted ball w/ BUEs    Stacking Blocks Picking up 1" blocks off tabletop using precision prehension and placing in a stack with min cues to decrease compensatory movement patterns. Pt stacked blocks according to provided pattern, requiring initial verbal cues and breakdown of task for spatial orientation and understanding of activity. OT provided additional HOH A to facilitate correction of misaligned blocks (e.g., pushing to blocks together, adjusting blocks to be stacked directly on top of one another).    Stereognosis  Activity Activity involving pt identifying or locating various objects by touch only; unable to identify objects on first attempt w/ success identifying all objects during second attempt.            OT Short Term Goals - 12/21/20 1451       OT SHORT TERM GOAL #1   Title Pt will improve participation in manipulation of clothing fasteners as evidenced by completing 9-Hole Peg Test w/ less than 5 drops using R, dominant hand    Baseline R hand 2 min, 20 sec w/ 7 drops    Time 4    Period Weeks    Status On-going    Target Date 01/17/21      OT SHORT TERM GOAL #2   Title Pt will improve safety w/ functional overhead reach by increasing strength of R shoulder flexion to at least 4-/5    Baseline 3+/5 (full ROM against gravity; slight resistance)    Time 4    Period Weeks    Status On-going    Target Date 01/17/21      OT SHORT TERM GOAL #3   Title Pt will demonstrate ability to thread first sleeve into jacket to improve initiation of task and independence w/ BADLs    Baseline Min A to initiate task    Time 4    Period Weeks    Status On-going    Target Date 01/17/21             OT Long Term Goals - 12/21/20 1451       OT LONG TERM GOAL #1   Title Pt will demonstrate independence w/ full HEP designed for BUE strengthening, GM/FMC, and coordination by discharge    Baseline No HEP at this time    Time 8    Period Weeks    Status On-going    Target Date 02/14/21      OT LONG TERM GOAL #2   Title Pt will improve RUE GMC and coordination for improved participation in ADLs as evidenced by increasing Box and Blocks score by at least 5 blocks    Baseline TBA    Time 8    Period Weeks    Status On-going    Target Date 02/14/21      OT LONG TERM GOAL #3   Title Pt will be able to thread zipper w/ Mod I, using AE prn, in at least 1 attempt    Baseline Unable to thread zipper    Time 8    Period Weeks    Status On-going    Target Date 02/14/21      OT LONG TERM GOAL #4    Title Pt will be able to complete simulated self-feeding w/ Mod I (scooping, bringing utensil to mouth, cutting food, etc.) using AE prn    Baseline Reports difficulty manipulating utensils    Time 8  Period Weeks    Status On-going    Target Date 02/14/21      OT LONG TERM GOAL #5   Title Pt will be able to don socks w/ Mod I, using AE prn, in at least 1 attempt by d/c    Baseline Needs assist w/ donning socks    Time 8    Period Weeks    Status On-going    Target Date 02/14/21             Plan - 12/29/20 0935     Clinical Impression Statement OT facilitated light strengthening of RUE for increased safety w/ functional overhead reach; weighted ball/dowel rod used to provide additional somatosensory input for continued NMR. Pt demonstrated difficulty w/ bilateral coordination, often compensating for R-sided weakness w/ LUE. Pt will continue to benefit from single-arm exercises w/ significant cues and facilitation for alignment and positioning. Pt also exhibited improved success w/ copying visual pattern this session, potentially due to increased simplicity of pattern compared to prior attempt. Problem-solving, particularly observered when pt was prompted to correct misaligned blocks, continues to be limited. Stereognosis activity then completed to further address pt's description of "not being able to feel her fingers." Pt being able to identify objects on first attempt w/ 100% success identifying objects on second attempt may indicate difficulty primarily w/ cognition/recognition of objects vs. receiving of tactile sensory input.    OT Occupational Profile and History Detailed Assessment- Review of Records and additional review of physical, cognitive, psychosocial history related to current functional performance    Occupational performance deficits (Please refer to evaluation for details): ADL's;IADL's;Rest and Sleep;Leisure;Social Participation    Body Structure / Function / Physical  Skills ADL;Decreased knowledge of use of DME;Gait;Strength;Balance;Dexterity;GMC;Tone;Body mechanics;Proprioception;UE functional use;IADL;Endurance;ROM;Vision;Coordination;Mobility;Sensation;FMC    Cognitive Skills Memory;Problem Solve;Safety Awareness;Sequencing    Rehab Potential Fair   Due to comorbidities   Clinical Decision Making Several treatment options, min-mod task modification necessary    Comorbidities Affecting Occupational Performance: Presence of comorbidities impacting occupational performance    Comorbidities impacting occupational performance description: Vascular dementia; anxiety    Modification or Assistance to Complete Evaluation  Min-Moderate modification of tasks or assist with assess necessary to complete eval    OT Frequency 2x / week    OT Duration 8 weeks    OT Treatment/Interventions Self-care/ADL training;Aquatic Therapy;Electrical Stimulation;Moist Heat;Cryotherapy;Therapeutic exercise;Neuromuscular education;Energy conservation;Manual Therapy;Functional Mobility Training;DME and/or AE instruction;Passive range of motion;Patient/family education;Visual/perceptual remediation/compensation;Cognitive remediation/compensation;Therapeutic activities;Splinting;Balance training    Plan Practice donning jacket; continue w/ single-arm RUE GMC and strengthening / in-hand manipulation skills    Recommended Other Services Currently receiving ST and PT services at this location    Consulted and Agree with Plan of Care Patient            Patient will benefit from skilled therapeutic intervention in order to improve the following deficits and impairments:   Body Structure / Function / Physical Skills: ADL, Decreased knowledge of use of DME, Gait, Strength, Balance, Dexterity, GMC, Tone, Body mechanics, Proprioception, UE functional use, IADL, Endurance, ROM, Vision, Coordination, Mobility, Sensation, [facility] Cognitive Skills: Memory, Problem Solve, Safety Awareness,  Sequencing   Visit Diagnosis: Other lack of coordination  Left middle cerebral artery stroke (HCC)  Muscle weakness (generalized)  Visuospatial deficit  Frontal lobe and executive function deficit  Other abnormalities of gait and mobility   Problem List Patient Active Problem List   Diagnosis Date Noted   Snoring 12/12/2020   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  Malnutrition of moderate degree 12/07/2020   Stroke (cerebrum) ([place]) 12/01/2020   Dizziness 08/30/2020   History of stroke 07/14/2020   Azotemia 07/11/2020   Anemia 07/11/2020   CVA (cerebral vascular accident) ([place]) 06/28/2020   Cerebral infarction ([place]) 06/25/2020   Acute left-sided weakness 06/24/2020   Vascular dementia with behavior disturbance 10/13/2019   Vascular dementia without behavioral disturbance ([place]) 07/15/2019   Cerebrovascular accident ([place]) 06/25/2019   Brain fog 04/22/2019   History of COVID-19 04/22/2019   Essential hypertension 03/03/2019   Hypertensive urgency 03/03/2019    Kathrine Cords, MSOT, OTR/L 12/29/2020, 10:36 AM  [place]. [place], [name], [address] Phone: [phone]   Fax:  [phone]  Name: Jacqueline Bonilla MRN: [id number] Date of Birth: May 23, 1955

## 2020-12-29 NOTE — Assessment & Plan Note (Signed)
Well-controlled hypertension.  Continue losartan 12.5 mg daily.

## 2020-12-29 NOTE — Assessment & Plan Note (Signed)
Importance of cholesterol management for stroke prevention discussed. Continue Lipitor 40 mg daily.

## 2020-12-29 NOTE — Therapy (Signed)
[place]. [place], [name], [address] Phone: [phone]   Fax:  [phone]  Speech Language Pathology Treatment  Patient Details  Name: Jacqueline Bonilla MRN: [id number] Date of Birth: [birth year] Referring Provider (SLP): Dawayne Patricia   Encounter Date: 12/29/2020   End of Session - 12/29/20 1225     Visit Number 4    Number of Visits 17    Date for SLP Re-Evaluation 02/20/21    SLP Start Time 3662    SLP Stop Time  1055    SLP Time Calculation (min) 40 min    Activity Tolerance Patient tolerated treatment well             Past Medical History:  Diagnosis Date   Anemia    Anxiety    Depression    Dysmenorrhea    Endometriosis    Fibroid    Hypertension    Microhematuria    negative workup   Osteoporosis    Tachycardia    Thrombocytopenia ([place])     Past Surgical History:  Procedure Laterality Date   BREAST BIOPSY     BUBBLE STUDY  12/08/2020   Procedure: BUBBLE STUDY;  Surgeon: Josue Hector, MD;  Location: [facility];  Service: Cardiovascular;;   CESAREAN SECTION     hysteroscopic resection     implantable loop recorder implant  10/21/2019   Medtronic Reveal Linq model LNQ 22 ([place] [id number]) implantable loop recorder   IR CT HEAD LTD  12/01/2020   IR PERCUTANEOUS ART THROMBECTOMY/INFUSION INTRACRANIAL INC DIAG ANGIO  12/01/2020   IR US GUIDE VASC ACCESS RIGHT  12/01/2020   RADIOLOGY WITH ANESTHESIA N/A 12/01/2020   Procedure: IR WITH ANESTHESIA - CODE STROKE;  Surgeon: Radiologist, Medication, MD;  Location: [facility];  Service: Radiology;  Laterality: N/A;   TEE WITHOUT CARDIOVERSION N/A 12/08/2020   Procedure: TRANSESOPHAGEAL ECHOCARDIOGRAM (TEE);  Surgeon: Josue Hector, MD;  Location: [facility] ENDOSCOPY;  Service: Cardiovascular;  Laterality: N/A;    There were no vitals filed for this visit.   Subjective Assessment - 12/29/20 1225     Subjective "Doing ok"    Currently in  Pain? No/denies                   ADULT SLP TREATMENT - 12/29/20 1227       General Information   Behavior/Cognition Alert;Cooperative;Pleasant mood      Treatment Provided   Treatment provided Cognitive-Linquistic      Cognitive-Linquistic Treatment   Treatment focused on Cognition    Skilled Treatment Facilitated verbal expression by creating a list of "conversation starters" to increase memory of information when asked by others. Pt had difficulty generating information about herself and required repetition of information in different ways to increase her ability to answer questions about self.      Assessment / Recommendations / Plan   Plan Continue with current plan of care      Progression Toward Goals   Progression toward goals Progressing toward goals                SLP Short Term Goals - 12/20/20 1709       SLP SHORT TERM GOAL #1   Title Pt will provide 3 areas of challenge for her given min verbal cues to demonstrate increased awareness.    Time 4    Period Weeks    Status New    Target Date 01/17/21  SLP SHORT TERM GOAL #2   Title Pt will recall 3 memory strategies to aid recall of important information with minA.    Time 4    Period Weeks    Status New    Target Date 01/17/21      SLP SHORT TERM GOAL #3   Title Pt will identify errors during structured cognitive task with modA verbal cues.    Time 4    Period Weeks    Status New    Target Date 01/17/21      SLP SHORT TERM GOAL #4   Title Pt will recall 3 word finding strategies to decrease anomia in conversation given minA.    Time 4    Period Weeks    Status New    Target Date 01/17/21              SLP Long Term Goals - 12/20/20 1711       SLP LONG TERM GOAL #1   Title Pt will provide 3 areas of challenge for her independently to demonstrate increased awareness of deficits.    Time 8    Period Weeks    Status New    Target Date 02/14/21      SLP LONG TERM GOAL #2    Title Patient will demonstrate use of memory strategies to schedule activities, recall weekly events and items to maintain safety to participate socially in functional living environment    Time 8    Period Weeks    Status New    Target Date 02/14/21      SLP LONG TERM GOAL #3   Title Pt will identify errors during structured cognitive task with minA verbal cues.    Time 8    Period Weeks    Status New    Target Date 02/14/21      SLP LONG TERM GOAL #4   Title Pt will demonstrate 2-3 word finding strategies to decrease anomia in conversation independently.    Time 8    Period Weeks    Status New    Target Date 02/14/21              Plan - 12/29/20 1226     Clinical Impression Statement See tx note. SLP rec skilled ST services to provide assistance with memory and attention strategies to increase independence and efficiency at home.    Speech Therapy Frequency 2x / week    Duration 8 weeks    Treatment/Interventions Compensatory strategies;Cueing hierarchy;Functional tasks;Patient/family education;Environmental controls;Cognitive reorganization;Multimodal communcation approach;Language facilitation;Compensatory techniques;Internal/external aids;SLP instruction and feedback    Potential to Achieve Goals Fair    Potential Considerations Ability to learn/carryover information    Consulted and Agree with Plan of Care Patient             Patient will benefit from skilled therapeutic intervention in order to improve the following deficits and impairments:   Cognitive communication deficit    Problem List Patient Active Problem List   Diagnosis Date Noted   Snoring 12/12/2020   Left middle cerebral artery stroke ([place]) 12/08/2020   Malnutrition of moderate degree 12/07/2020   Stroke (cerebrum) ([place]) 12/01/2020   Dizziness 08/30/2020   History of stroke 07/14/2020   Azotemia 07/11/2020   Anemia 07/11/2020   CVA (cerebral vascular accident) ([place]) 06/28/2020    Cerebral infarction ([place]) 06/25/2020   Acute left-sided weakness 06/24/2020   Vascular dementia with behavior disturbance 10/13/2019   Vascular dementia without behavioral disturbance ([place]) 07/15/2019  Cerebrovascular accident ([place]) 06/25/2019   Brain fog 04/22/2019   History of COVID-19 04/22/2019   Essential hypertension 03/03/2019   Hypertensive urgency 03/03/2019    [name], CCC-SLP 12/29/2020, 12:30 PM  [place]. [place], [name], [address] Phone: [phone]   Fax:  [phone]   Name: CHIQUITTA MATTY MRN: [id number] Date of Birth: May 17, 1955

## 2020-12-30 ENCOUNTER — Encounter: Payer: Self-pay | Admitting: Registered Nurse

## 2020-12-30 ENCOUNTER — Other Ambulatory Visit: Payer: Self-pay

## 2020-12-30 ENCOUNTER — Encounter: Payer: Medicare HMO | Attending: Physical Medicine & Rehabilitation | Admitting: Registered Nurse

## 2020-12-30 VITALS — BP 114/75 | HR 89 | Temp 98.0°F | Ht 64.0 in | Wt 117.6 lb

## 2020-12-30 DIAGNOSIS — F01518 Vascular dementia, unspecified severity, with other behavioral disturbance: Secondary | ICD-10-CM

## 2020-12-30 DIAGNOSIS — Z8673 Personal history of transient ischemic attack (TIA), and cerebral infarction without residual deficits: Secondary | ICD-10-CM | POA: Diagnosis not present

## 2020-12-30 DIAGNOSIS — E785 Hyperlipidemia, unspecified: Secondary | ICD-10-CM | POA: Insufficient documentation

## 2020-12-30 DIAGNOSIS — I63512 Cerebral infarction due to unspecified occlusion or stenosis of left middle cerebral artery: Secondary | ICD-10-CM | POA: Diagnosis not present

## 2020-12-30 DIAGNOSIS — I10 Essential (primary) hypertension: Secondary | ICD-10-CM | POA: Insufficient documentation

## 2020-12-30 NOTE — Progress Notes (Signed)
Subjective:    Patient ID: Jacqueline Bonilla, female    DOB: 1955/05/29, 65 y.o.   MRN: [id number]  HPI: Jacqueline Bonilla is a 65 y.o. female who returns for Transitional Care Visit for Follow up of her  Left Middle Cerebral Artery Stroke, Seizure Prophylaxis, Essential Hypertension, Dyslipidemia, Vascular Dementia and History of CVA x 2.in the past 14 months and received inpatient therapy from 06/28/2020- 07/28/2020.  She presented to [facility] on 12/01/2020, via EMS  for Acute new onset Aphasia, Dr Cheral Marker seen patient. ( Neurology Following). Dr Cheral Marker H&P:12/02/2019 Jacqueline Bonilla is an 65 y.o. 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 On arrival to the ED the patient was with dense receptive aphasia and mild extensor posturing of her RUE with mild RUE and RLE weakness.    Home medications include ASA. She is not on a blood thinner.   CT Head WO Contrast Interval increase in size of the left sylvian subarachnoid hemorrhage, now extending to the posterior parietal cortical sulci with small amount of blood in the interpeduncular cistern. No hydrocephalus.  Jacqueline Bonilla underwent : MECHANICAL THROMBECTOMY on 12/01/2020    CT Head: Postop left MCA thrombectomy. Stroke. Follow-up subarachnoid hemorrhage IMPRESSION: IMPRESSION: 1. Acute subarachnoid hemorrhage, stable from earlier today. No hydrocephalus or new hemorrhage. 2. No acute ischemic infarct 3. Atrophy and chronic microvascular ischemic change in   CT ANGIO:  IMPRESSION: Occlusion of distal left M2 MCA  branch at the posterior aspect of the sylvian fissure (see saved key images).   Perfusion imaging demonstrates no core infarction and 16 mL of penumbra within the posterior left MCA territory.   No hemodynamically significant stenosis in the neck.  Jacqueline Bonilla was admitted to inpatient Rehabilitation on 12/08/2020 and discharged home on 12/07/2020. She is receiving outpatient therapy at Neuro[facility]Rehabilitation. She denies any pain. She rates her pain 0. Also reports she has a good appetite.           Pain Inventory Average Pain 0 Pain Right Now 0 My pain is  no pain  LOCATION OF PAIN  no pain  BOWEL Number of stools per week: 7   BLADDER Normal     Mobility walk without assistance how many minutes can you walk? 20 ability to climb steps?  yes do you drive?  no Do you have any goals in this area?  yes  Function retired I need assistance with the following:  dressing Do you have any goals in this area?  yes  Neuro/Psych weakness numbness loss of taste or smell  Prior Studies Any changes since last visit?  no  Physicians involved in your care Any changes since last visit?  no   Family History  Problem Relation Age of Onset   Cancer Father        non hodgkin lymphoma & skin   Heart attack Maternal Grandfather    Dementia Mother    Polymyositis Sister    Social History   Socioeconomic History   Marital status: Married    Spouse name: [name]   Number of children: 1   Years of education: Not  on file   Highest education level: Not on file  Occupational History   Occupation: accounting  Tobacco Use   Smoking status: Never   Smokeless tobacco: Never  Vaping Use   Vaping Use: Never used  Substance and Sexual Activity   Alcohol use: Never   Drug use: Never   Sexual activity: Not Currently    Partners: Male    Comment: husband vasectomy  Other Topics Concern   Not on file  Social History Narrative   Lives with husband   Grandchildren - 1    Works - Investment banker, 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%   Gun in home - yes - secured      Right handed   Caffeine: maybe tea every now and then   Social Determinants of Radio broadcast assistant Strain: Low Risk    Difficulty of Paying Living Expenses: Not hard at all  Food Insecurity: No Food Insecurity   Worried About Charity fundraiser in the Last Year: Never true   Arboriculturist in the Last Year: Never true  Transportation Needs: No Transportation Needs   Lack of Transportation (Medical): No   Lack of Transportation (Non-Medical): No  Physical Activity: Sufficiently Active   Days of Exercise per Week: 5 days   Minutes of Exercise per Session: 30 min  Stress: No Stress Concern Present   Feeling of Stress : Not at all  Social Connections: Not on file   Past Surgical History:  Procedure Laterality Date   BREAST BIOPSY     BUBBLE STUDY  12/08/2020   Procedure: BUBBLE STUDY;  Surgeon: Josue Hector, MD;  Location: [facility] ENDOSCOPY;  Service: Cardiovascular;;   CESAREAN SECTION     hysteroscopic resection     implantable loop recorder implant  10/21/2019   Medtronic Reveal Linq model LNQ 22 ([place] [id number]) implantable loop recorder   IR CT HEAD LTD  12/01/2020   IR PERCUTANEOUS ART THROMBECTOMY/INFUSION INTRACRANIAL INC DIAG ANGIO  12/01/2020   IR US GUIDE VASC ACCESS RIGHT  12/01/2020   RADIOLOGY WITH ANESTHESIA N/A 12/01/2020   Procedure: IR WITH ANESTHESIA - CODE STROKE;  Surgeon: Radiologist, Medication, MD;  Location: [place];  Service: Radiology;  Laterality: N/A;   TEE WITHOUT CARDIOVERSION N/A 12/08/2020   Procedure: TRANSESOPHAGEAL ECHOCARDIOGRAM (TEE);  Surgeon: Josue Hector, MD;  Location: [facility] ENDOSCOPY;  Service: Cardiovascular;  Laterality: N/A;   Past Medical History:  Diagnosis Date   Anemia    Anxiety    Depression    Dysmenorrhea    Endometriosis    Fibroid    Hypertension    Microhematuria    negative workup   Osteoporosis    Tachycardia    Thrombocytopenia (HCC)     BP 114/75    Pulse 89    Temp 98 F (36.7 C)    Ht 5\' 4"  (1.626 m)    Wt 117 lb 9.6 oz (53.3 kg)    LMP 01/02/2003    SpO2 98%    BMI 20.19 kg/m   Opioid Risk Score:   Fall Risk Score:  `1  Depression screen PHQ 2/9  Depression screen [name] 2/9 12/30/2020 12/28/2020 08/25/2020 07/20/2020 07/14/2020 01/18/2020 12/03/2019  Decreased Interest 0 0 0 0 0 0 0  Down, Depressed, Hopeless 0 0 0 0 0 0 0  PHQ - 2 Score 0 0 0 0 0 0 0  Altered sleeping 0 - - 0 - - -  Tired,  decreased energy 1 - - 0 - - -  Change in appetite 0 - - 0 - - -  Feeling bad or failure about yourself  0 - - 0 - - -  Trouble concentrating 2 - - 0 - - -  Moving slowly or fidgety/restless 1 - - 0 - - -  Suicidal thoughts 0 - - 0 - - -  PHQ-9 Score 4 - - 0 - - -  Difficult doing work/chores Not difficult at all - - - - - -     Review of Systems  Constitutional:  Positive for fatigue.  HENT: Negative.    Eyes: Negative.   Respiratory: Negative.    Cardiovascular: Negative.   Gastrointestinal: Negative.   Endocrine: Negative.   Genitourinary: Negative.   Musculoskeletal: Negative.   Skin: Negative.   Allergic/Immunologic: Negative.   Neurological:  Positive for weakness and numbness.  Hematological: Negative.   Psychiatric/Behavioral: Negative.        Objective:   Physical Exam Vitals and nursing note reviewed.  Constitutional:      Appearance: Normal appearance.  Cardiovascular:     Rate and Rhythm: Normal rate and regular rhythm.     Pulses: Normal pulses.     Heart sounds: Normal heart sounds.  Pulmonary:     Effort: Pulmonary effort is normal.     Breath sounds: Normal breath sounds.  Musculoskeletal:     Cervical back: Normal range of motion and neck supple.     Comments: Normal Muscle Bulk and Muscle Testing Reveals:  Upper Extremities: Full ROM and Muscle Strength on Right 4/5 and  Left 5/5 Lower Extremities: Full ROM and Muscle Strength 5/5 Arises from Table with ease Narrow Based  Gait      Skin:    General: Skin is warm and dry.  Neurological:     Mental Status: She is alert and oriented to person, place, and time.  Psychiatric:        Mood and Affect: Mood normal.        Behavior: Behavior normal.         Assessment & Plan:  Left Middle Cerebral Artery Stroke/, Seizure Prophylaxis, Vascular Dementia and History of CVA x 2.: Continue Outpatient Therapy with Neuro-Rehabilitation. She is scheduled for Neurology appointment. Continue current medication regimen. Continue to Monitor.  Essential Hypertension: Continue current medication regimen. PCP Following. Continue to Monitor.  , Dyslipidemia: Continue current medication Regimen. PCP Following. Continue to Monitor.   F/U with Dr Letta Pate in 4- 6 weeks

## 2021-01-04 ENCOUNTER — Ambulatory Visit: Payer: Medicare HMO | Admitting: Speech Pathology

## 2021-01-04 ENCOUNTER — Encounter: Payer: Self-pay | Admitting: Speech Pathology

## 2021-01-04 ENCOUNTER — Ambulatory Visit: Payer: Medicare HMO | Admitting: Physical Therapy

## 2021-01-04 ENCOUNTER — Other Ambulatory Visit: Payer: Self-pay

## 2021-01-04 ENCOUNTER — Encounter: Payer: Self-pay | Admitting: Physical Therapy

## 2021-01-04 ENCOUNTER — Encounter: Payer: Self-pay | Admitting: Occupational Therapy

## 2021-01-04 ENCOUNTER — Ambulatory Visit: Payer: Medicare HMO | Attending: Physician Assistant | Admitting: Occupational Therapy

## 2021-01-04 DIAGNOSIS — R262 Difficulty in walking, not elsewhere classified: Secondary | ICD-10-CM | POA: Insufficient documentation

## 2021-01-04 DIAGNOSIS — R41842 Visuospatial deficit: Secondary | ICD-10-CM | POA: Diagnosis not present

## 2021-01-04 DIAGNOSIS — R41844 Frontal lobe and executive function deficit: Secondary | ICD-10-CM | POA: Insufficient documentation

## 2021-01-04 DIAGNOSIS — R278 Other lack of coordination: Secondary | ICD-10-CM | POA: Diagnosis not present

## 2021-01-04 DIAGNOSIS — M6281 Muscle weakness (generalized): Secondary | ICD-10-CM | POA: Insufficient documentation

## 2021-01-04 DIAGNOSIS — I6300 Cerebral infarction due to thrombosis of unspecified precerebral artery: Secondary | ICD-10-CM | POA: Diagnosis not present

## 2021-01-04 DIAGNOSIS — I63512 Cerebral infarction due to unspecified occlusion or stenosis of left middle cerebral artery: Secondary | ICD-10-CM | POA: Diagnosis not present

## 2021-01-04 DIAGNOSIS — R2689 Other abnormalities of gait and mobility: Secondary | ICD-10-CM | POA: Insufficient documentation

## 2021-01-04 DIAGNOSIS — R41841 Cognitive communication deficit: Secondary | ICD-10-CM | POA: Insufficient documentation

## 2021-01-04 NOTE — Therapy (Signed)
[address] [place], [name], [address] Phone: [phone]   Fax:  [phone]  Speech Language Pathology Treatment  Patient Details  Name: Jacqueline Bonilla MRN: [id number] Date of Birth: 03-15-1955 Referring Provider (SLP): Dawayne Patricia   Encounter Date: 01/04/2021   End of Session - 01/04/21 1019     Visit Number 5    Number of Visits 17    Date for SLP Re-Evaluation 02/20/21    SLP Start Time 8588    SLP Stop Time  1055    SLP Time Calculation (min) 40 min    Activity Tolerance Patient tolerated treatment well             Past Medical History:  Diagnosis Date   Anemia    Anxiety    Depression    Dysmenorrhea    Endometriosis    Fibroid    Hypertension    Microhematuria    negative workup   Osteoporosis    Tachycardia    Thrombocytopenia ([place])     Past Surgical History:  Procedure Laterality Date   BREAST BIOPSY     BUBBLE STUDY  12/08/2020   Procedure: BUBBLE STUDY;  Surgeon: Josue Hector, MD;  Location: [place];  Service: Cardiovascular;;   CESAREAN SECTION     hysteroscopic resection     implantable loop recorder implant  10/21/2019   Medtronic Reveal Linq model LNQ 22 ([place] [id number]) implantable loop recorder   IR CT HEAD LTD  12/01/2020   IR PERCUTANEOUS ART THROMBECTOMY/INFUSION INTRACRANIAL INC DIAG ANGIO  12/01/2020   IR US GUIDE VASC ACCESS RIGHT  12/01/2020   RADIOLOGY WITH ANESTHESIA N/A 12/01/2020   Procedure: IR WITH ANESTHESIA - CODE STROKE;  Surgeon: Radiologist, Medication, MD;  Location: [place];  Service: Radiology;  Laterality: N/A;   TEE WITHOUT CARDIOVERSION N/A 12/08/2020   Procedure: TRANSESOPHAGEAL ECHOCARDIOGRAM (TEE);  Surgeon: Josue Hector, MD;  Location: [facility] ENDOSCOPY;  Service: Cardiovascular;  Laterality: N/A;    There were no vitals filed for this visit.   Subjective Assessment - 01/04/21 1018     Subjective "Doing good."    Currently in  Pain? No/denies                   ADULT SLP TREATMENT - 01/04/21 1603       General Information   Behavior/Cognition Alert;Cooperative;Pleasant mood      Treatment Provided   Treatment provided Cognitive-Linquistic      Cognitive-Linquistic Treatment   Treatment focused on Cognition    Skilled Treatment Facilitated increased recall by edu memory strategies. Pt benefited from typed information in large (36 font) to assist with reading, therefore, increasing comprehension of information. Discussed using visuals at home to assist with recall of information vs. relying on husband to increase independence. Will continue with internal strategies next session.      Assessment / Recommendations / Plan   Plan Continue with current plan of care      Progression Toward Goals   Progression toward goals Progressing toward goals                SLP Short Term Goals - 12/20/20 1709       SLP SHORT TERM GOAL #1   Title Pt will provide 3 areas of challenge for her given min verbal cues to demonstrate increased awareness.    Time 4    Period Weeks    Status New  Target Date 01/17/21      SLP SHORT TERM GOAL #2   Title Pt will recall 3 memory strategies to aid recall of important information with minA.    Time 4    Period Weeks    Status New    Target Date 01/17/21      SLP SHORT TERM GOAL #3   Title Pt will identify errors during structured cognitive task with modA verbal cues.    Time 4    Period Weeks    Status New    Target Date 01/17/21      SLP SHORT TERM GOAL #4   Title Pt will recall 3 word finding strategies to decrease anomia in conversation given minA.    Time 4    Period Weeks    Status New    Target Date 01/17/21              SLP Long Term Goals - 12/20/20 1711       SLP LONG TERM GOAL #1   Title Pt will provide 3 areas of challenge for her independently to demonstrate increased awareness of deficits.    Time 8    Period Weeks    Status  New    Target Date 02/14/21      SLP LONG TERM GOAL #2   Title Patient will demonstrate use of memory strategies to schedule activities, recall weekly events and items to maintain safety to participate socially in functional living environment    Time 8    Period Weeks    Status New    Target Date 02/14/21      SLP LONG TERM GOAL #3   Title Pt will identify errors during structured cognitive task with minA verbal cues.    Time 8    Period Weeks    Status New    Target Date 02/14/21      SLP LONG TERM GOAL #4   Title Pt will demonstrate 2-3 word finding strategies to decrease anomia in conversation independently.    Time 8    Period Weeks    Status New    Target Date 02/14/21              Plan - 01/04/21 1019     Clinical Impression Statement See tx note. SLP rec skilled ST services to provide assistance with memory and attention strategies to increase independence and efficiency at home.    Speech Therapy Frequency 2x / week    Duration 8 weeks    Treatment/Interventions Compensatory strategies;Cueing hierarchy;Functional tasks;Patient/family education;Environmental controls;Cognitive reorganization;Multimodal communcation approach;Language facilitation;Compensatory techniques;Internal/external aids;SLP instruction and feedback    Potential to Achieve Goals Fair    Potential Considerations Ability to learn/carryover information    Consulted and Agree with Plan of Care Patient             Patient will benefit from skilled therapeutic intervention in order to improve the following deficits and impairments:   Cognitive communication deficit    Problem List Patient Active Problem List   Diagnosis Date Noted   Dyslipidemia 12/29/2020   Left middle cerebral artery stroke ([place]) 12/08/2020   Stroke (cerebrum) ([place]) 12/01/2020   History of stroke 07/14/2020   Anemia 07/11/2020   CVA (cerebral vascular accident) ([place]) 06/28/2020   Cerebral infarction ([place]) 06/25/2020    Vascular dementia with behavior disturbance 10/13/2019   Vascular dementia without behavioral disturbance ([place]) 07/15/2019   Cerebrovascular accident ([place]) 06/25/2019   History of COVID-19 04/22/2019  Essential hypertension 03/03/2019   Hypertensive urgency 03/03/2019    [name], CCC-SLP 01/04/2021, 4:06 PM  [address]. [place], [name], [address] Phone: [phone]   Fax:  [phone]   Name: Jacqueline Bonilla MRN: [id number] Date of Birth: [birth year]

## 2021-01-04 NOTE — Therapy (Signed)
[place]. [place], [name], [address] Phone: [phone]   Fax:  [phone]  Occupational Therapy Treatment  Patient Details  Name: Jacqueline Bonilla MRN: [id number] Date of Birth: 04-24-1955 Referring Provider (OT): Lauraine Rinne, PA-C   Encounter Date: 01/04/2021   OT End of Session - 01/04/21 1101     Visit Number 5    Number of Visits 17    Date for OT Re-Evaluation 03/20/21    Authorization Type [employer]    Authorization - Visit Number 5    Authorization - Number of Visits 17    Progress Note Due on Visit 10    OT Start Time 1059    OT Stop Time 1139    OT Time Calculation (min) 40 min    Activity Tolerance Patient tolerated treatment well    Behavior During Therapy [facility] for tasks assessed/performed            Past Medical History:  Diagnosis Date   Anemia    Anxiety    Depression    Dysmenorrhea    Endometriosis    Fibroid    Hypertension    Microhematuria    negative workup   Osteoporosis    Tachycardia    Thrombocytopenia ([place])     Past Surgical History:  Procedure Laterality Date   BREAST BIOPSY     BUBBLE STUDY  12/08/2020   Procedure: BUBBLE STUDY;  Surgeon: Josue Hector, MD;  Location: [place];  Service: Cardiovascular;;   CESAREAN SECTION     hysteroscopic resection     implantable loop recorder implant  10/21/2019   Medtronic Reveal Linq model LNQ 22 ([place] [id number]) implantable loop recorder   IR CT HEAD LTD  12/01/2020   IR PERCUTANEOUS ART THROMBECTOMY/INFUSION INTRACRANIAL INC DIAG ANGIO  12/01/2020   IR US GUIDE VASC ACCESS RIGHT  12/01/2020   RADIOLOGY WITH ANESTHESIA N/A 12/01/2020   Procedure: IR WITH ANESTHESIA - CODE STROKE;  Surgeon: Radiologist, Medication, MD;  Location: [place];  Service: Radiology;  Laterality: N/A;   TEE WITHOUT CARDIOVERSION N/A 12/08/2020   Procedure: TRANSESOPHAGEAL ECHOCARDIOGRAM (TEE);  Surgeon: Josue Hector, MD;  Location:  [facility] ENDOSCOPY;  Service: Cardiovascular;  Laterality: N/A;    There were no vitals filed for this visit.   Subjective Assessment - 01/04/21 1100     Subjective  "I'm the same"    Pertinent History Cerebral infarction 12/01/20; MRI indicated small acute infarcts in L MCA distribution and SAH overlying bilateral cerebral hemispheres. PMH includes 2 prior strokes in June 2021 (R-sided weakness) and June 2022 (L-sided weakness); vascular dementia; HTN; loop recorder implant 10/2019; osteoporosis; thrombocytopenia; anxiety; anemia    Patient Stated Goals "Anything I can to get better"    Currently in Pain? No/denies             Treatment/Exercises - 01/04/21    Weight Bearing Weight bearing in quadruped position w/ weight shifting side-to-side for increased activation and proprioceptive input over affected RUE; pt required mod tactile and verbal cues for consistent alignment and body mechanics during exercise. Completed 2 sets of 10 reps.    Scapular Stability Weight bearing through elbows/forearms while in prone w/ pt completing 2x10 of scapular retraction/protraction to facilitate shoulder strength and stability. Occasional tactile cues during 1st set to facilitate understanding of isolated scapular movements w/ good carryover to 2nd set.    Sequencing Activity Sequencing activity requiring pt to order cards w/ written ADL/IADL  tasks in the correct order. Pt able to complete 4-step sequence w/ Mod I, 2/3 5-step sequences w/ Mod I, and remaining 5-step sequence w/ Min A for problem-solving correction of error. OT incorporated pt picking up cards w/ R hand only and putting them next to corresponding number in order of activity sequence to incorporate and address [facility] and visual perception. Pt required up/down grading prn, extended time for processing, and initial breakdown of task to facilitate success.    Dice Activity Flipping dice using R hand pincer prehension to find correct number and match w/  numbers written on whiteboard to facilitate [facility], visual perception, and attention/problem-solving. Able to complete w/ Mod I (extended time) after initial breakdown of task for understanding.            OT Short Term Goals - 12/21/20 1451       OT SHORT TERM GOAL #1   Title Pt will improve participation in manipulation of clothing fasteners as evidenced by completing 9-Hole Peg Test w/ less than 5 drops using R, dominant hand    Baseline R hand 2 min, 20 sec w/ 7 drops    Time 4    Period Weeks    Status On-going    Target Date 01/17/21      OT SHORT TERM GOAL #2   Title Pt will improve safety w/ functional overhead reach by increasing strength of R shoulder flexion to at least 4-/5    Baseline 3+/5 (full ROM against gravity; slight resistance)    Time 4    Period Weeks    Status On-going    Target Date 01/17/21      OT SHORT TERM GOAL #3   Title Pt will demonstrate ability to thread first sleeve into jacket to improve initiation of task and independence w/ BADLs    Baseline Min A to initiate task    Time 4    Period Weeks    Status On-going    Target Date 01/17/21             OT Long Term Goals - 12/21/20 1451       OT LONG TERM GOAL #1   Title Pt will demonstrate independence w/ full HEP designed for BUE strengthening, GM/FMC, and coordination by discharge    Baseline No HEP at this time    Time 8    Period Weeks    Status On-going    Target Date 02/14/21      OT LONG TERM GOAL #2   Title Pt will improve RUE GMC and coordination for improved participation in ADLs as evidenced by increasing Box and Blocks score by at least 5 blocks    Baseline TBA    Time 8    Period Weeks    Status On-going    Target Date 02/14/21      OT LONG TERM GOAL #3   Title Pt will be able to thread zipper w/ Mod I, using AE prn, in at least 1 attempt    Baseline Unable to thread zipper    Time 8    Period Weeks    Status On-going    Target Date 02/14/21      OT LONG TERM  GOAL #4   Title Pt will be able to complete simulated self-feeding w/ Mod I (scooping, bringing utensil to mouth, cutting food, etc.) using AE prn    Baseline Reports difficulty manipulating utensils    Time 8    Period Weeks  Status On-going    Target Date 02/14/21      OT LONG TERM GOAL #5   Title Pt will be able to don socks w/ Mod I, using AE prn, in at least 1 attempt by d/c    Baseline Needs assist w/ donning socks    Time 8    Period Weeks    Status On-going    Target Date 02/14/21             Plan - 01/04/21 1102     Clinical Impression Statement OT returned to addressing scapular and shoulder strength and stability due to weakness observed in prior sessions. Pt continues to experience difficulty problem-solving through body mechanics, often requiring modeling and multimodal cues (verbal, visual/gestural, tactile) for understanding and success w/ positioning and alignment. Pt also demonstrates mild decreased strength of elbow extension, which is likely not particularly limiting during functional activities. When completing cognitive activities, OT increased demand of tasks by incorporating both visual perceptual and FM elements w/ pt demonstrating most difficulty w/ problem-solving and visuospatial relationships. Pt also was able to don zip-up jacket w/ Min A, only requiring assist to thread/start zipper, which is an improvement from previous sessions.    OT Occupational Profile and History Detailed Assessment- Review of Records and additional review of physical, cognitive, psychosocial history related to current functional performance    Occupational performance deficits (Please refer to evaluation for details): ADL's;IADL's;Rest and Sleep;Leisure;Social Participation    Body Structure / Function / Physical Skills ADL;Decreased knowledge of use of DME;Gait;Strength;Balance;Dexterity;GMC;Tone;Body mechanics;Proprioception;UE functional  use;IADL;Endurance;ROM;Vision;Coordination;Mobility;Sensation;FMC    Cognitive Skills Memory;Problem Solve;Safety Awareness;Sequencing    Rehab Potential Fair   Due to comorbidities   Clinical Decision Making Several treatment options, min-mod task modification necessary    Comorbidities Affecting Occupational Performance: Presence of comorbidities impacting occupational performance    Comorbidities impacting occupational performance description: Vascular dementia; anxiety    Modification or Assistance to Complete Evaluation  Min-Moderate modification of tasks or assist with assess necessary to complete eval    OT Frequency 2x / week    OT Duration 8 weeks    OT Treatment/Interventions Self-care/ADL training;Aquatic Therapy;Electrical Stimulation;Moist Heat;Cryotherapy;Therapeutic exercise;Neuromuscular education;Energy conservation;Manual Therapy;Functional Mobility Training;DME and/or AE instruction;Passive range of motion;Patient/family education;Visual/perceptual remediation/compensation;Cognitive remediation/compensation;Therapeutic activities;Splinting;Balance training    Plan Continue w/ single-arm RUE GMC and strengthening / in-hand manipulation skills w/ weight for somatosensory input    Recommended Other Services Currently receiving ST and PT services at this location    Consulted and Agree with Plan of Care Patient            Patient will benefit from skilled therapeutic intervention in order to improve the following deficits and impairments:   Body Structure / Function / Physical Skills: ADL, Decreased knowledge of use of DME, Gait, Strength, Balance, Dexterity, GMC, Tone, Body mechanics, Proprioception, UE functional use, IADL, Endurance, ROM, Vision, Coordination, Mobility, Sensation, [facility] Cognitive Skills: Memory, Problem Solve, Safety Awareness, Sequencing   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 [facility])  Other lack of coordination  Muscle weakness  (generalized)  Visuospatial deficit  Frontal lobe and executive function deficit  Other abnormalities of gait and mobility    Problem List Patient Active Problem List   Diagnosis Date Noted   Dyslipidemia 12/29/2020   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 [place], [name], [address] Phone: [phone]   Fax:  [phone]  Name: Jacqueline Bonilla MRN: [id number] Date of Birth: January 07, 1955

## 2021-01-04 NOTE — Progress Notes (Signed)
Carelink Summary Report / Loop Recorder 

## 2021-01-04 NOTE — Therapy (Signed)
[place]. [place], [name], [address] Phone: [phone]   Fax:  [phone]  Physical Therapy Treatment  Patient Details  Name: Jacqueline Bonilla MRN: [id number] Date of Birth: 22-Oct-1955 Referring Provider (PT): Sagardia   Encounter Date: 01/04/2021   PT End of Session - 01/04/21 1253     Visit Number 4    Number of Visits 12    Date for PT Re-Evaluation 02/03/21    PT Start Time 0829    PT Stop Time 0915    PT Time Calculation (min) 46 min    Equipment Utilized During Treatment Gait belt    Activity Tolerance Patient tolerated treatment well    Behavior During Therapy [facility] for tasks assessed/performed             Past Medical History:  Diagnosis Date   Anemia    Anxiety    Depression    Dysmenorrhea    Endometriosis    Fibroid    Hypertension    Microhematuria    negative workup   Osteoporosis    Tachycardia    Thrombocytopenia ([place])     Past Surgical History:  Procedure Laterality Date   BREAST BIOPSY     BUBBLE STUDY  12/08/2020   Procedure: BUBBLE STUDY;  Surgeon: Josue Hector, MD;  Location: [place];  Service: Cardiovascular;;   CESAREAN SECTION     hysteroscopic resection     implantable loop recorder implant  10/21/2019   Medtronic Reveal Linq model LNQ 22 ([place] [id number]) implantable loop recorder   IR CT HEAD LTD  12/01/2020   IR PERCUTANEOUS ART THROMBECTOMY/INFUSION INTRACRANIAL INC DIAG ANGIO  12/01/2020   IR US GUIDE VASC ACCESS RIGHT  12/01/2020   RADIOLOGY WITH ANESTHESIA N/A 12/01/2020   Procedure: IR WITH ANESTHESIA - CODE STROKE;  Surgeon: Radiologist, Medication, MD;  Location: [place];  Service: Radiology;  Laterality: N/A;   TEE WITHOUT CARDIOVERSION N/A 12/08/2020   Procedure: TRANSESOPHAGEAL ECHOCARDIOGRAM (TEE);  Surgeon: Josue Hector, MD;  Location: [facility] ENDOSCOPY;  Service: Cardiovascular;  Laterality: N/A;    There were no vitals filed for this visit.    Subjective Assessment - 01/04/21 0933     Subjective Patient reports no real changes in her status.    Pertinent History prior CVA affecting R side, HTN    Patient Stated Goals be more mobile, improve balance,  increase independence    Currently in Pain? No/denies                               [facility] Adult PT Treatment/Exercise - 01/04/21 0001       Ambulation/Gait   Gait Comments Ambulated while holding 4# ball in BUE, out in front of body. Smooth gait with decreased ataxic movements and unsteadiness, imporved control and coordination while turning in either direction.      High Level Balance   High Level Balance Comments Standing with each foot on physiodisc. Required at least UUE suppport as she performed weight hsifts in all directions, CGA-min A.      Neuro Re-ed    Neuro Re-ed Details  Attempted braiding, paitent was able to crossover in front without difficulty, but much more challenging to crossover behind. Patient then performed back steps with rotation, reaching with UE to place object on a table behind. 4 x in each direction, improved with repetition.      Knee/Hip  Exercises: Aerobic   Nustep level 4 x 5 minutes Then 2 x 30 seconds each of hig resistance and fast cadence.      Knee/Hip Exercises: Standing   SLS SLS on Air Ex pad. She had much more difficulty standing on LLE, ankle tended to roll into pronation.                       PT Short Term Goals - 01/04/21 1257       PT SHORT TERM GOAL #1   Title Pt will be I with initial HEP    Time 1    Period Weeks    Status On-going    Target Date 01/11/21               PT Long Term Goals - 12/29/20 0931       PT LONG TERM GOAL #1   Title Pt will demo improved Berg Balance score to 52/56 to demo decreased fall risk    Status On-going      PT LONG TERM GOAL #2   Title Pt will demo 5x STS <15 seconds with no UE assist    Status On-going                   Plan -  01/04/21 1254     Clinical Impression Statement Continued to challenge high level balance, including speed of movement, coordination. She had increased diffiuclty turning and moving backwards, but did imporve again with repetition and showed immediate carryover at the end of treatment with her gait.    Personal Factors and Comorbidities Comorbidity 2    Comorbidities hx of CVA, HTN    Stability/Clinical Decision Making Evolving/Moderate complexity    Clinical Decision Making Moderate    Rehab Potential Good    PT Frequency 2x / week    PT Duration Other (comment)   11w   PT Treatment/Interventions ADLs/Self Care Home Management;Gait training;Stair training;Functional mobility training;Therapeutic activities;Therapeutic exercise;Balance training;Neuromuscular re-education;Manual techniques;Patient/family education    PT Next Visit Plan work on balance, functional gait, safety, strength    PT Home Exercise Plan Access Code: [email or web address]    Consulted and Agree with Plan of Care Patient             Patient will benefit from skilled therapeutic intervention in order to improve the following deficits and impairments:  Difficulty walking, Abnormal gait, Impaired UE functional use, Decreased endurance, Cardiopulmonary status limiting activity, Decreased activity tolerance, Impaired vision/preception, Decreased balance, Postural dysfunction, Decreased strength, Decreased mobility  Visit Diagnosis: Other lack of coordination  Muscle weakness (generalized)  Balance problem  Difficulty in walking, not elsewhere classified  Other abnormalities of gait and mobility     Problem List Patient Active Problem List   Diagnosis Date Noted   Dyslipidemia 12/29/2020   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 [place], [name], [address] Phone: [phone]   Fax:  [phone]  Name: Jacqueline Laming  GAO Bonilla MRN: [id number] Date of Birth: Oct 17, 1955

## 2021-01-06 ENCOUNTER — Encounter: Payer: Self-pay | Admitting: Occupational Therapy

## 2021-01-06 ENCOUNTER — Ambulatory Visit: Payer: Medicare HMO | Admitting: Physical Therapy

## 2021-01-06 ENCOUNTER — Other Ambulatory Visit: Payer: Self-pay

## 2021-01-06 ENCOUNTER — Encounter: Payer: Self-pay | Admitting: Speech Pathology

## 2021-01-06 ENCOUNTER — Ambulatory Visit: Payer: Medicare HMO | Admitting: Occupational Therapy

## 2021-01-06 ENCOUNTER — Encounter: Payer: Self-pay | Admitting: Physical Therapy

## 2021-01-06 ENCOUNTER — Ambulatory Visit: Payer: Medicare HMO | Admitting: Speech Pathology

## 2021-01-06 DIAGNOSIS — I6300 Cerebral infarction due to thrombosis of unspecified precerebral artery: Secondary | ICD-10-CM | POA: Diagnosis not present

## 2021-01-06 DIAGNOSIS — R41842 Visuospatial deficit: Secondary | ICD-10-CM

## 2021-01-06 DIAGNOSIS — R41844 Frontal lobe and executive function deficit: Secondary | ICD-10-CM

## 2021-01-06 DIAGNOSIS — R278 Other lack of coordination: Secondary | ICD-10-CM

## 2021-01-06 DIAGNOSIS — R2689 Other abnormalities of gait and mobility: Secondary | ICD-10-CM | POA: Diagnosis not present

## 2021-01-06 DIAGNOSIS — M6281 Muscle weakness (generalized): Secondary | ICD-10-CM | POA: Diagnosis not present

## 2021-01-06 DIAGNOSIS — I63512 Cerebral infarction due to unspecified occlusion or stenosis of left middle cerebral artery: Secondary | ICD-10-CM | POA: Diagnosis not present

## 2021-01-06 DIAGNOSIS — R262 Difficulty in walking, not elsewhere classified: Secondary | ICD-10-CM

## 2021-01-06 DIAGNOSIS — R41841 Cognitive communication deficit: Secondary | ICD-10-CM | POA: Diagnosis not present

## 2021-01-06 NOTE — Therapy (Signed)
[place]. [place], [name], [address] Phone: [phone]   Fax:  [phone]  Physical Therapy Treatment  Patient Details  Name: Jacqueline Bonilla MRN: [id number] Date of Birth: May 31, 1955 Referring Provider (PT): Sagardia   Encounter Date: 01/06/2021   PT End of Session - 01/06/21 0930     Visit Number 5    Number of Visits 12    Date for PT Re-Evaluation 02/03/21    PT Start Time 0850    PT Stop Time 0928    PT Time Calculation (min) 38 min    Activity Tolerance Patient tolerated treatment well    Behavior During Therapy [facility] for tasks assessed/performed             Past Medical History:  Diagnosis Date   Anemia    Anxiety    Depression    Dysmenorrhea    Endometriosis    Fibroid    Hypertension    Microhematuria    negative workup   Osteoporosis    Tachycardia    Thrombocytopenia ([place])     Past Surgical History:  Procedure Laterality Date   BREAST BIOPSY     BUBBLE STUDY  12/08/2020   Procedure: BUBBLE STUDY;  Surgeon: Josue Hector, MD;  Location: [place];  Service: Cardiovascular;;   CESAREAN SECTION     hysteroscopic resection     implantable loop recorder implant  10/21/2019   Medtronic Reveal Linq model LNQ 22 ([place] [id number]) implantable loop recorder   IR CT HEAD LTD  12/01/2020   IR PERCUTANEOUS ART THROMBECTOMY/INFUSION INTRACRANIAL INC DIAG ANGIO  12/01/2020   IR US GUIDE VASC ACCESS RIGHT  12/01/2020   RADIOLOGY WITH ANESTHESIA N/A 12/01/2020   Procedure: IR WITH ANESTHESIA - CODE STROKE;  Surgeon: Radiologist, Medication, MD;  Location: [place];  Service: Radiology;  Laterality: N/A;   TEE WITHOUT CARDIOVERSION N/A 12/08/2020   Procedure: TRANSESOPHAGEAL ECHOCARDIOGRAM (TEE);  Surgeon: Josue Hector, MD;  Location: [facility] ENDOSCOPY;  Service: Cardiovascular;  Laterality: N/A;    There were no vitals filed for this visit.   Subjective Assessment - 01/06/21 0851      Subjective I'm feeling good, no falls or close calls with falling; balance is still a big concern.    Patient Stated Goals be more mobile, improve balance,  increase independence    Currently in Pain? No/denies                               [facility] Adult PT Treatment/Exercise - 01/06/21 0001       Knee/Hip Exercises: Aerobic   Nustep level 2 progressing to level 3 HIIT training 50 rest 10 4 rounds      Knee/Hip Exercises: Standing   Lateral Step Up Both;1 set;10 reps;Hand Hold: 2;Step Height: 4"    Forward Step Up Right;1 set;10 reps;Hand Hold: 2;Step Height: 4"                 Balance Exercises - 01/06/21 0001       Balance Exercises: Standing   Standing Eyes Opened Narrow base of support (BOS);Foam/compliant surface;3 reps   60 seconds   Tandem Stance Eyes open;3 reps;30 secs   on and off foam pad   Other Standing Exercises cone taps on single cone with L hand in pocket (tends to grab bars with it) 1x20  PT Education - 01/06/21 0930     Education Details exercise form and purpose    Person(s) Educated Patient    Methods Explanation    Comprehension Verbalized understanding              PT Short Term Goals - 01/04/21 1257       PT SHORT TERM GOAL #1   Title Pt will be I with initial HEP    Time 1    Period Weeks    Status On-going    Target Date 01/11/21               PT Long Term Goals - 12/29/20 0931       PT LONG TERM GOAL #1   Title Pt will demo improved Berg Balance score to 52/56 to demo decreased fall risk    Status On-going      PT LONG TERM GOAL #2   Title Pt will demo 5x STS <15 seconds with no UE assist    Status On-going                   Plan - 01/06/21 0931     Clinical Impression Statement Ms. Hollar arrives today feeling well; balance is still a big concern but she does not report any falls or close calls. We worked on a combination of strength and balance today. Did need  intermittent cues for form and safety but seemed to tolerate session well overall. Had a bit of confusion with differentiating right from left side, needed cues for this as well. Also tends to shift hips right/shoulders left when working on balance activities, used Pharmacist, hospital along with multimodal tactile and verbal cuing to try to help correct. Will continue to progress as able and tolerated.    Personal Factors and Comorbidities Comorbidity 2    Comorbidities hx of CVA, HTN    Examination-Activity Limitations Stand;Locomotion Level    Examination-Participation Restrictions Community Activity;Interpersonal Relationship    Stability/Clinical Decision Making Evolving/Moderate complexity    Clinical Decision Making Moderate    Rehab Potential Good    PT Frequency 2x / week    PT Duration Other (comment)   11w   PT Treatment/Interventions ADLs/Self Care Home Management;Gait training;Stair training;Functional mobility training;Therapeutic activities;Therapeutic exercise;Balance training;Neuromuscular re-education;Manual techniques;Patient/family education    PT Next Visit Plan work on balance, functional gait, safety, strength    PT Home Exercise Plan Access Code: [email or web address]    Consulted and Agree with Plan of Care Patient             Patient will benefit from skilled therapeutic intervention in order to improve the following deficits and impairments:  Difficulty walking, Abnormal gait, Impaired UE functional use, Decreased endurance, Cardiopulmonary status limiting activity, Decreased activity tolerance, Impaired vision/preception, Decreased balance, Postural dysfunction, Decreased strength, Decreased mobility  Visit Diagnosis: Other lack of coordination  Balance problem  Muscle weakness (generalized)  Difficulty in walking, not elsewhere classified     Problem List Patient Active Problem List   Diagnosis Date Noted   Dyslipidemia 12/29/2020   Left middle cerebral artery stroke  ([place]) 12/08/2020   Stroke (cerebrum) ([place]) 12/01/2020   History of stroke 07/14/2020   Anemia 07/11/2020   CVA (cerebral vascular accident) ([place]) 06/28/2020   Cerebral infarction ([place]) 06/25/2020   Vascular dementia with behavior disturbance 10/13/2019   Vascular dementia without behavioral disturbance ([place]) 07/15/2019   Cerebrovascular accident ([place]) 06/25/2019   History of COVID-19 04/22/2019   Essential hypertension 03/03/2019  Hypertensive urgency 03/03/2019   Ann Lions PT, DPT, PN2   Supplemental Physical Therapist [place]    Pager [phone] Acute Rehab Office [place]. [place], [name], [address] Phone: [phone]   Fax:  [phone]  Name: Jacqueline Bonilla MRN: [id number] Date of Birth: 09-03-1955

## 2021-01-06 NOTE — Therapy (Signed)
[facility] [place], [name], [address] Phone: [phone]   Fax:  [phone]  Speech Language Pathology Treatment  Patient Details  Name: Jacqueline Bonilla MRN: [id number] Date of Birth: Jun 02, 1955 Referring Provider (SLP): Dawayne Patricia   Encounter Date: 01/06/2021   End of Session - 01/06/21 1102     Visit Number 6    Number of Visits 17    Date for SLP Re-Evaluation 02/20/21    SLP Start Time 1012    SLP Stop Time  1057    SLP Time Calculation (min) 45 min    Activity Tolerance Patient tolerated treatment well             Past Medical History:  Diagnosis Date   Anemia    Anxiety    Depression    Dysmenorrhea    Endometriosis    Fibroid    Hypertension    Microhematuria    negative workup   Osteoporosis    Tachycardia    Thrombocytopenia ([place])     Past Surgical History:  Procedure Laterality Date   BREAST BIOPSY     BUBBLE STUDY  12/08/2020   Procedure: BUBBLE STUDY;  Surgeon: Josue Hector, MD;  Location: [facility];  Service: Cardiovascular;;   CESAREAN SECTION     hysteroscopic resection     implantable loop recorder implant  10/21/2019   Medtronic Reveal Linq model LNQ 22 ([place] [id number]) implantable loop recorder   IR CT HEAD LTD  12/01/2020   IR PERCUTANEOUS ART THROMBECTOMY/INFUSION INTRACRANIAL INC DIAG ANGIO  12/01/2020   IR US GUIDE VASC ACCESS RIGHT  12/01/2020   RADIOLOGY WITH ANESTHESIA N/A 12/01/2020   Procedure: IR WITH ANESTHESIA - CODE STROKE;  Surgeon: Radiologist, Medication, MD;  Location: [facility];  Service: Radiology;  Laterality: N/A;   TEE WITHOUT CARDIOVERSION N/A 12/08/2020   Procedure: TRANSESOPHAGEAL ECHOCARDIOGRAM (TEE);  Surgeon: Josue Hector, MD;  Location: [facility] ENDOSCOPY;  Service: Cardiovascular;  Laterality: N/A;    There were no vitals filed for this visit.   Subjective Assessment - 01/06/21 1101     Subjective "I can't remember."    Currently  in Pain? No/denies                   ADULT SLP TREATMENT - 01/06/21 2046       General Information   Behavior/Cognition Alert;Cooperative;Pleasant mood      Treatment Provided   Treatment provided Cognitive-Linquistic      Cognitive-Linquistic Treatment   Treatment focused on Cognition    Skilled Treatment Facilitated increased recall by edu on internal memory strategies. Pt benefited from typed information in large (36 font) to assist with reading, therefore, increasing comprehension of information. Pt had difficulty providing examples of internal strategies. Utilized repetition for recall of examples. Will continue with practice of internal memory strategies next session.      Progression Toward Goals   Progression toward goals Progressing toward goals                SLP Short Term Goals - 12/20/20 1709       SLP SHORT TERM GOAL #1   Title Pt will provide 3 areas of challenge for her given min verbal cues to demonstrate increased awareness.    Time 4    Period Weeks    Status New    Target Date 01/17/21      SLP SHORT TERM GOAL #2   Title  Pt will recall 3 memory strategies to aid recall of important information with minA.    Time 4    Period Weeks    Status New    Target Date 01/17/21      SLP SHORT TERM GOAL #3   Title Pt will identify errors during structured cognitive task with modA verbal cues.    Time 4    Period Weeks    Status New    Target Date 01/17/21      SLP SHORT TERM GOAL #4   Title Pt will recall 3 word finding strategies to decrease anomia in conversation given minA.    Time 4    Period Weeks    Status New    Target Date 01/17/21              SLP Long Term Goals - 12/20/20 1711       SLP LONG TERM GOAL #1   Title Pt will provide 3 areas of challenge for her independently to demonstrate increased awareness of deficits.    Time 8    Period Weeks    Status New    Target Date 02/14/21      SLP LONG TERM GOAL #2   Title  Patient will demonstrate use of memory strategies to schedule activities, recall weekly events and items to maintain safety to participate socially in functional living environment    Time 8    Period Weeks    Status New    Target Date 02/14/21      SLP LONG TERM GOAL #3   Title Pt will identify errors during structured cognitive task with minA verbal cues.    Time 8    Period Weeks    Status New    Target Date 02/14/21      SLP LONG TERM GOAL #4   Title Pt will demonstrate 2-3 word finding strategies to decrease anomia in conversation independently.    Time 8    Period Weeks    Status New    Target Date 02/14/21              Plan - 01/06/21 1102     Clinical Impression Statement See tx note. SLP rec skilled ST services to provide assistance with memory and attention strategies to increase independence and efficiency at home.    Speech Therapy Frequency 2x / week    Duration 8 weeks    Treatment/Interventions Compensatory strategies;Cueing hierarchy;Functional tasks;Patient/family education;Environmental controls;Cognitive reorganization;Multimodal communcation approach;Language facilitation;Compensatory techniques;Internal/external aids;SLP instruction and feedback    Potential to Achieve Goals Fair    Potential Considerations Ability to learn/carryover information    Consulted and Agree with Plan of Care Patient             Patient will benefit from skilled therapeutic intervention in order to improve the following deficits and impairments:   Cognitive communication deficit    Problem List Patient Active Problem List   Diagnosis Date Noted   Dyslipidemia 12/29/2020   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 [place], [name], [address] Phone: [phone]   Fax:  [phone]   Name: TERESA NICODEMUS MRN: [id number] Date of Birth: 02-Aug-1955

## 2021-01-06 NOTE — Therapy (Signed)
[facility]. [place], [name], [address] Phone: [phone]   Fax:  [phone]  Occupational Therapy Treatment  Patient Details  Name: Jacqueline Bonilla MRN: [id number] Date of Birth: Mar 16, 1955 Referring Provider (OT): Lauraine Rinne, PA-C   Encounter Date: 01/06/2021   OT End of Session - 01/06/21 0931     Visit Number 6    Number of Visits 17    Date for OT Re-Evaluation 03/20/21    Authorization Type [employer]    Authorization - Visit Number 6    Authorization - Number of Visits 17    Progress Note Due on Visit 10    OT Start Time [id number]    OT Stop Time 1010    OT Time Calculation (min) 42 min    Activity Tolerance Patient tolerated treatment well    Behavior During Therapy [facility] for tasks assessed/performed            Past Medical History:  Diagnosis Date   Anemia    Anxiety    Depression    Dysmenorrhea    Endometriosis    Fibroid    Hypertension    Microhematuria    negative workup   Osteoporosis    Tachycardia    Thrombocytopenia ([place])     Past Surgical History:  Procedure Laterality Date   BREAST BIOPSY     BUBBLE STUDY  12/08/2020   Procedure: BUBBLE STUDY;  Surgeon: Josue Hector, MD;  Location: [facility];  Service: Cardiovascular;;   CESAREAN SECTION     hysteroscopic resection     implantable loop recorder implant  10/21/2019   Medtronic Reveal Linq model LNQ 22 ([place] [id number]) implantable loop recorder   IR CT HEAD LTD  12/01/2020   IR PERCUTANEOUS ART THROMBECTOMY/INFUSION INTRACRANIAL INC DIAG ANGIO  12/01/2020   IR US GUIDE VASC ACCESS RIGHT  12/01/2020   RADIOLOGY WITH ANESTHESIA N/A 12/01/2020   Procedure: IR WITH ANESTHESIA - CODE STROKE;  Surgeon: Radiologist, Medication, MD;  Location: [facility];  Service: Radiology;  Laterality: N/A;   TEE WITHOUT CARDIOVERSION N/A 12/08/2020   Procedure: TRANSESOPHAGEAL ECHOCARDIOGRAM (TEE);  Surgeon: Josue Hector, MD;  Location:  [facility] ENDOSCOPY;  Service: Cardiovascular;  Laterality: N/A;    There were no vitals filed for this visit.   Subjective Assessment - 01/06/21 0929     Subjective  Pt reports she worked on her balance today in PT; states it is hard for her because she is leaning to one side but cannot tell she is leaning even with a mirror in front of her    Pertinent History Cerebral infarction 12/01/20; MRI indicated small acute infarcts in L MCA distribution and SAH overlying bilateral cerebral hemispheres. PMH includes 2 prior strokes in June 2021 (R-sided weakness) and June 2022 (L-sided weakness); vascular dementia; HTN; loop recorder implant 10/2019; osteoporosis; thrombocytopenia; anxiety; anemia    Patient Stated Goals "Anything I can to get better"    Currently in Pain? No/denies             Treatment/Exercises - 01/06/21    PVC Pipe Tree  Constructional activity w/ PVC pipe tree to facilitate bilateral integration, alternating attention, visual perception/memory, and problem solving; pt able to complete activity w/ mod multimodal cues and initial breakdown of task for success. OT also provided up and down grading of task based on pt's performance. Completed activity w/ 2 lb wrist weights on both wrists for additional proprioceptive input. Able to  take structure apart w/out difficulty.    Visual Scanning Tabletop double number cancellation activity w/ pt identifying 23/33 targets w/ significantly extended time but no cues for attention to task or visual scanning. Slightly increased number of missed targets located on R side of page, but pattern of errors not apparently significant.    [place] Picking up small cylindrical pegs and rotating in R hand to place back in small pegboard. OT provided occasional verbal cues to decrease compensatory pattern of using pegboard to stabilize peg during rotation, but otherwise able to complete 9/9 pegs w/ fair success/min drops.            OT Short Term Goals -  12/21/20 1451       OT SHORT TERM GOAL #1   Title Pt will improve participation in manipulation of clothing fasteners as evidenced by completing 9-Hole Peg Test w/ less than 5 drops using R, dominant hand    Baseline R hand 2 min, 20 sec w/ 7 drops    Time 4    Period Weeks    Status On-going    Target Date 01/17/21      OT SHORT TERM GOAL #2   Title Pt will improve safety w/ functional overhead reach by increasing strength of R shoulder flexion to at least 4-/5    Baseline 3+/5 (full ROM against gravity; slight resistance)    Time 4    Period Weeks    Status On-going    Target Date 01/17/21      OT SHORT TERM GOAL #3   Title Pt will demonstrate ability to thread first sleeve into jacket to improve initiation of task and independence w/ BADLs    Baseline Min A to initiate task    Time 4    Period Weeks    Status On-going    Target Date 01/17/21             OT Long Term Goals - 12/21/20 1451       OT LONG TERM GOAL #1   Title Pt will demonstrate independence w/ full HEP designed for BUE strengthening, GM/FMC, and coordination by discharge    Baseline No HEP at this time    Time 8    Period Weeks    Status On-going    Target Date 02/14/21      OT LONG TERM GOAL #2   Title Pt will improve RUE GMC and coordination for improved participation in ADLs as evidenced by increasing Box and Blocks score by at least 5 blocks    Baseline TBA    Time 8    Period Weeks    Status On-going    Target Date 02/14/21      OT LONG TERM GOAL #3   Title Pt will be able to thread zipper w/ Mod I, using AE prn, in at least 1 attempt    Baseline Unable to thread zipper    Time 8    Period Weeks    Status On-going    Target Date 02/14/21      OT LONG TERM GOAL #4   Title Pt will be able to complete simulated self-feeding w/ Mod I (scooping, bringing utensil to mouth, cutting food, etc.) using AE prn    Baseline Reports difficulty manipulating utensils    Time 8    Period Weeks     Status On-going    Target Date 02/14/21      OT LONG TERM GOAL #5   Title  Pt will be able to don socks w/ Mod I, using AE prn, in at least 1 attempt by d/c    Baseline Needs assist w/ donning socks    Time 8    Period Weeks    Status On-going    Target Date 02/14/21             Plan - 01/06/21 0931     Clinical Impression Statement Pt continues to be motivated in therapy and is progressing toward goals. OT continued to focus on dual task interventions w/ pt demonstrating the most difficulty w/ problem-solving during these activities (e.g., repositioning hand placement to put pieces together during PVC pipe tree when initially unsuccessful). With cues and repetition, pt is typically successful. OT also continued to address in-hand manipulation and reassessed progress toward goals due to observed success w/ activity. Pt decreased time to complete 9-HPT by 1 min w/ improved accuracy/less drops, indicating improvement w/ [facility] and dexterity.    OT Occupational Profile and History Detailed Assessment- Review of Records and additional review of physical, cognitive, psychosocial history related to current functional performance    Occupational performance deficits (Please refer to evaluation for details): ADL's;IADL's;Rest and Sleep;Leisure;Social Participation    Body Structure / Function / Physical Skills ADL;Decreased knowledge of use of DME;Gait;Strength;Balance;Dexterity;GMC;Tone;Body mechanics;Proprioception;UE functional use;IADL;Endurance;ROM;Vision;Coordination;Mobility;Sensation;FMC    Cognitive Skills Memory;Problem Solve;Safety Awareness;Sequencing    Rehab Potential Fair   Due to comorbidities   Clinical Decision Making Several treatment options, min-mod task modification necessary    Comorbidities Affecting Occupational Performance: Presence of comorbidities impacting occupational performance    Comorbidities impacting occupational performance description: Vascular dementia; anxiety     Modification or Assistance to Complete Evaluation  Min-Moderate modification of tasks or assist with assess necessary to complete eval    OT Frequency 2x / week    OT Duration 8 weeks    OT Treatment/Interventions Self-care/ADL training;Aquatic Therapy;Electrical Stimulation;Moist Heat;Cryotherapy;Therapeutic exercise;Neuromuscular education;Energy conservation;Manual Therapy;Functional Mobility Training;DME and/or AE instruction;Passive range of motion;Patient/family education;Visual/perceptual remediation/compensation;Cognitive remediation/compensation;Therapeutic activities;Splinting;Balance training    Plan Continue w/ single-arm RUE GMC and strengthening; simulated meal time activity to assess use of utensils and potential benefit of AE    Recommended Other Services Currently receiving ST and PT services at this location    Consulted and Agree with Plan of Care Patient            Patient will benefit from skilled therapeutic intervention in order to improve the following deficits and impairments:   Body Structure / Function / Physical Skills: ADL, Decreased knowledge of use of DME, Gait, Strength, Balance, Dexterity, GMC, Tone, Body mechanics, Proprioception, UE functional use, IADL, Endurance, ROM, Vision, Coordination, Mobility, Sensation, [facility] Cognitive Skills: Memory, Problem Solve, Safety Awareness, Sequencing   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 [place], [name], [address] Phone: [phone]   Fax:  [phone]  Name: ELFRIEDA ESPINO MRN: [id number] Date of Birth: [birth year]

## 2021-01-09 ENCOUNTER — Ambulatory Visit: Payer: Medicare HMO | Admitting: Speech Pathology

## 2021-01-09 ENCOUNTER — Ambulatory Visit: Payer: Medicare HMO | Admitting: Physical Therapy

## 2021-01-09 ENCOUNTER — Encounter: Payer: Self-pay | Admitting: Speech Pathology

## 2021-01-09 ENCOUNTER — Ambulatory Visit: Payer: Medicare HMO | Admitting: Occupational Therapy

## 2021-01-09 ENCOUNTER — Other Ambulatory Visit: Payer: Self-pay

## 2021-01-09 ENCOUNTER — Encounter: Payer: Self-pay | Admitting: Physical Therapy

## 2021-01-09 ENCOUNTER — Encounter: Payer: Self-pay | Admitting: Occupational Therapy

## 2021-01-09 DIAGNOSIS — R41842 Visuospatial deficit: Secondary | ICD-10-CM | POA: Diagnosis not present

## 2021-01-09 DIAGNOSIS — M6281 Muscle weakness (generalized): Secondary | ICD-10-CM

## 2021-01-09 DIAGNOSIS — R278 Other lack of coordination: Secondary | ICD-10-CM

## 2021-01-09 DIAGNOSIS — I6300 Cerebral infarction due to thrombosis of unspecified precerebral artery: Secondary | ICD-10-CM

## 2021-01-09 DIAGNOSIS — I63512 Cerebral infarction due to unspecified occlusion or stenosis of left middle cerebral artery: Secondary | ICD-10-CM

## 2021-01-09 DIAGNOSIS — R41844 Frontal lobe and executive function deficit: Secondary | ICD-10-CM | POA: Diagnosis not present

## 2021-01-09 DIAGNOSIS — R41841 Cognitive communication deficit: Secondary | ICD-10-CM

## 2021-01-09 DIAGNOSIS — R2689 Other abnormalities of gait and mobility: Secondary | ICD-10-CM

## 2021-01-09 DIAGNOSIS — R262 Difficulty in walking, not elsewhere classified: Secondary | ICD-10-CM

## 2021-01-09 NOTE — Therapy (Signed)
[place]. [place], [name], [address] Phone: [phone]   Fax:  [phone]  Occupational Therapy Treatment  Patient Details  Name: Jacqueline Bonilla MRN: [id number] Date of Birth: 04-26-1955 Referring Provider (OT): Lauraine Rinne, PA-C   Encounter Date: 01/09/2021   OT End of Session - 01/09/21 1020     Visit Number 7    Number of Visits 17    Date for OT Re-Evaluation 03/20/21    Authorization Type [employer]    Authorization - Visit Number 7    Authorization - Number of Visits 17    Progress Note Due on Visit 10    OT Start Time 1015    OT Stop Time 1057    OT Time Calculation (min) 42 min    Activity Tolerance Patient tolerated treatment well    Behavior During Therapy [facility] for tasks assessed/performed            Past Medical History:  Diagnosis Date   Anemia    Anxiety    Depression    Dysmenorrhea    Endometriosis    Fibroid    Hypertension    Microhematuria    negative workup   Osteoporosis    Tachycardia    Thrombocytopenia ([place])     Past Surgical History:  Procedure Laterality Date   BREAST BIOPSY     BUBBLE STUDY  12/08/2020   Procedure: BUBBLE STUDY;  Surgeon: Josue Hector, MD;  Location: [place];  Service: Cardiovascular;;   CESAREAN SECTION     hysteroscopic resection     implantable loop recorder implant  10/21/2019   Medtronic Reveal Linq model LNQ 22 ([place] [id number]) implantable loop recorder   IR CT HEAD LTD  12/01/2020   IR PERCUTANEOUS ART THROMBECTOMY/INFUSION INTRACRANIAL INC DIAG ANGIO  12/01/2020   IR US GUIDE VASC ACCESS RIGHT  12/01/2020   RADIOLOGY WITH ANESTHESIA N/A 12/01/2020   Procedure: IR WITH ANESTHESIA - CODE STROKE;  Surgeon: Radiologist, Medication, MD;  Location: [place];  Service: Radiology;  Laterality: N/A;   TEE WITHOUT CARDIOVERSION N/A 12/08/2020   Procedure: TRANSESOPHAGEAL ECHOCARDIOGRAM (TEE);  Surgeon: Josue Hector, MD;  Location:  [facility] ENDOSCOPY;  Service: Cardiovascular;  Laterality: N/A;    There were no vitals filed for this visit.   Subjective Assessment - 01/09/21 1015     Subjective  Pt reports she fell a few days ago at the [facility] getting out of the car next to a curb. States she hit her RUE and RLE which are both still a little scratched up and sore; denies hitting her head    Pertinent History Cerebral infarction 12/01/20; MRI indicated small acute infarcts in L MCA distribution and SAH overlying bilateral cerebral hemispheres. PMH includes 2 prior strokes in June 2021 (R-sided weakness) and June 2022 (L-sided weakness); vascular dementia; HTN; loop recorder implant 10/2019; osteoporosis; thrombocytopenia; anxiety; anemia    Patient Stated Goals "Anything I can to get better"    Currently in Pain? Yes    Pain Score 3     Pain Location Hand    Pain Orientation Right    Pain Descriptors / Indicators Sore    Pain Type Acute pain    Pain Onset In the past 7 days             Treatment/Exercises - 01/09/21    Clothing Manipulatives  Practiced threading zipper 4x; Mod A w/ first attempt and OT providing verbal cues for  problem-solving, extended time for processing, and HOH modeling for success. Assist decreased to Min A in 3rd and 4th trials.    Built-Up Utensils Scooping dried beans out of standard bowl using spoon w/ built-up handle and dropping into top of small therapy cone; OT upgraded activity by occasionally moving target requiring pt to reassess body mechanics for success. Completed w/ multiple drops; 9/15 beans w/out drops.  Practiced cutting yellow therapy putty using knife w/ foam built-up handle and built-up fork; completed w/ Mod I  Finding small beads hidden in putty and using precision pinch to pull each from putty one at a time; able to complete w/out difficulty. OT then placed beads in small bits of putty w/ pt practicing pushing fork w/ built-up handle into putty and moving them into a standard  bowl; occasional verbal cues for problem-solving    ADLs: UB Dressing Donning zip-up jacket w/ Min A; required notable increased time for initial orientation of jacket and threading zipper and assist from OT when reaching behind back to retrieve L side of jacket to thread LUE.         OT Short Term Goals - 01/06/21 1007       OT SHORT TERM GOAL #1   Title Pt will improve participation in manipulation of clothing fasteners as evidenced by completing 9-Hole Peg Test w/ less than 5 drops using R, dominant hand    Baseline R hand 2 min, 20 sec w/ 7 drops    Time 4    Period Weeks    Status Achieved   01/06/21 - 3 drops (1 min, 23 sec)   Target Date 01/17/21      OT SHORT TERM GOAL #2   Title Pt will improve safety w/ functional overhead reach by increasing strength of R shoulder flexion to at least 4-/5    Baseline 3+/5 (full ROM against gravity; slight resistance)    Time 4    Period Weeks    Status On-going    Target Date 01/17/21      OT SHORT TERM GOAL #3   Title Pt will demonstrate ability to thread first sleeve into jacket to improve initiation of task and independence w/ BADLs    Baseline Min A to initiate task    Time 4    Period Weeks    Status Achieved   01/05/20 - Mod I w/ donning jacket; assist only w/ threading zipper   Target Date 01/17/21             OT Long Term Goals - 01/06/21 1011       OT LONG TERM GOAL #1   Title Pt will demonstrate independence w/ full HEP designed for BUE strengthening, GM/FMC, and coordination by discharge    Baseline No HEP at this time    Time 8    Period Weeks    Status On-going    Target Date 02/14/21      OT LONG TERM GOAL #2   Title Pt will improve RUE GMC and coordination for improved participation in ADLs as evidenced by increasing Box and Blocks score by at least 5 blocks    Baseline 01/06/21 - 20 blocks    Time 8    Period Weeks    Status On-going    Target Date 02/14/21      OT LONG TERM GOAL #3   Title Pt will be  able to thread zipper w/ Mod I, using AE prn, in at least 1 attempt  Baseline Unable to thread zipper    Time 8    Period Weeks    Status On-going    Target Date 02/14/21      OT LONG TERM GOAL #4   Title Pt will be able to complete simulated self-feeding w/ Mod I (scooping, bringing utensil to mouth, cutting food, etc.) using AE prn    Baseline Reports difficulty manipulating utensils    Time 8    Period Weeks    Status On-going    Target Date 02/14/21      OT LONG TERM GOAL #5   Title Pt will be able to don socks w/ Mod I, using AE prn, in at least 1 attempt by d/c    Baseline Needs assist w/ donning socks    Time 8    Period Weeks    Status On-going    Target Date 02/14/21             Plan - 01/09/21 1021     Clinical Impression Statement Pt was slightly limited this session due to reported soreness/tenderness of RUE and R hand after sustaining a fall a few days ago. Considering this, OT focused session on [facility] and dexterity as well as participation in functional activities. With significant assist w/ problem-solving from OT, pt was able to ultimately thread zipper w/ SPV at conclusion of session and demonstrated good carryover of strategies practiced w/ zipper at start of session. This is a good indication of improvement considering pt's limitations w/ short-term memory. OT also addressed use of adaptive utensils and pt additionally reported that her son actually recently bought her built-up utensils. Practicing w/ AE focused mostly on body mechanics and being able to problem-solve different ways to successfully manipulate each tool to accomplish a specific task.    OT Occupational Profile and History Detailed Assessment- Review of Records and additional review of physical, cognitive, psychosocial history related to current functional performance    Occupational performance deficits (Please refer to evaluation for details): ADL's;IADL's;Rest and Sleep;Leisure;Social Participation     Body Structure / Function / Physical Skills ADL;Decreased knowledge of use of DME;Gait;Strength;Balance;Dexterity;GMC;Tone;Body mechanics;Proprioception;UE functional use;IADL;Endurance;ROM;Vision;Coordination;Mobility;Sensation;FMC    Cognitive Skills Memory;Problem Solve;Safety Awareness;Sequencing    Rehab Potential Fair   Due to comorbidities   Clinical Decision Making Several treatment options, min-mod task modification necessary    Comorbidities Affecting Occupational Performance: Presence of comorbidities impacting occupational performance    Comorbidities impacting occupational performance description: Vascular dementia; anxiety    Modification or Assistance to Complete Evaluation  Min-Moderate modification of tasks or assist with assess necessary to complete eval    OT Frequency 2x / week    OT Duration 8 weeks    OT Treatment/Interventions Self-care/ADL training;Aquatic Therapy;Electrical Stimulation;Moist Heat;Cryotherapy;Therapeutic exercise;Neuromuscular education;Energy conservation;Manual Therapy;Functional Mobility Training;DME and/or AE instruction;Passive range of motion;Patient/family education;Visual/perceptual remediation/compensation;Cognitive remediation/compensation;Therapeutic activities;Splinting;Balance training    Plan Continue w/ single-arm RUE GMC and strengthening; visual perception w/ emphasis on R-sided awareness and somatosensory input    Recommended Other Services Currently receiving ST and PT services at this location    Consulted and Agree with Plan of Care Patient            Patient will benefit from skilled therapeutic intervention in order to improve the following deficits and impairments:   Body Structure / Function / Physical Skills: ADL, Decreased knowledge of use of DME, Gait, Strength, Balance, Dexterity, GMC, Tone, Body mechanics, Proprioception, UE functional use, IADL, Endurance, ROM, Vision, Coordination, Mobility, Sensation, [facility] Cognitive  Skills:  Memory, Problem Solve, Safety Awareness, Sequencing   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 [facility])  Visuospatial deficit  Other lack of coordination  Muscle weakness (generalized)  Frontal lobe and executive function deficit   Problem List Patient Active Problem List   Diagnosis Date Noted   Dyslipidemia 12/29/2020   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 [place], [name], [address] Phone: [phone]   Fax:  [phone]  Name: JAMIELYN PETRUCCI MRN: [id number] Date of Birth: [birth year]

## 2021-01-09 NOTE — Therapy (Signed)
[place]. [place], [name], [address] Phone: [phone]   Fax:  [phone]  Physical Therapy Treatment  Patient Details  Name: Jacqueline Bonilla MRN: [id number] Date of Birth: 07-28-1955 Referring Provider (PT): Sagardia   Encounter Date: 01/09/2021   PT End of Session - 01/09/21 1013     Visit Number 6    Number of Visits 12    Date for PT Re-Evaluation 02/03/21    PT Start Time 0933    PT Stop Time 1013    PT Time Calculation (min) 40 min    Activity Tolerance Patient tolerated treatment well    Behavior During Therapy [facility] for tasks assessed/performed             Past Medical History:  Diagnosis Date   Anemia    Anxiety    Depression    Dysmenorrhea    Endometriosis    Fibroid    Hypertension    Microhematuria    negative workup   Osteoporosis    Tachycardia    Thrombocytopenia ([place])     Past Surgical History:  Procedure Laterality Date   BREAST BIOPSY     BUBBLE STUDY  12/08/2020   Procedure: BUBBLE STUDY;  Surgeon: Josue Hector, MD;  Location: [place];  Service: Cardiovascular;;   CESAREAN SECTION     hysteroscopic resection     implantable loop recorder implant  10/21/2019   Medtronic Reveal Linq model LNQ 22 ([place] [id number]) implantable loop recorder   IR CT HEAD LTD  12/01/2020   IR PERCUTANEOUS ART THROMBECTOMY/INFUSION INTRACRANIAL INC DIAG ANGIO  12/01/2020   IR US GUIDE VASC ACCESS RIGHT  12/01/2020   RADIOLOGY WITH ANESTHESIA N/A 12/01/2020   Procedure: IR WITH ANESTHESIA - CODE STROKE;  Surgeon: Radiologist, Medication, MD;  Location: [place];  Service: Radiology;  Laterality: N/A;   TEE WITHOUT CARDIOVERSION N/A 12/08/2020   Procedure: TRANSESOPHAGEAL ECHOCARDIOGRAM (TEE);  Surgeon: Josue Hector, MD;  Location: [facility] ENDOSCOPY;  Service: Cardiovascular;  Laterality: N/A;    There were no vitals filed for this visit.   Subjective Assessment - 01/09/21 0939      Subjective [facility] [place] [facility] [place] [facility] [place] curb and landed on her R side. She reports bleeding at her hand and knee at the time. She arrives iwth her R hand bandaged.    Pertinent History prior CVA affecting R side, HTN    Patient Stated Goals be more mobile, improve balance,  increase independence                               [facility] Adult PT Treatment/Exercise - 01/09/21 0001       Ambulation/Gait   Ambulation/Gait Yes    Ambulation/Gait Assistance 7: Independent    Ambulation Distance (Feet) 100 Feet    Assistive device None    Gait Pattern Step-through pattern;Decreased stride length;Ataxic    Ambulation Surface Level;Indoor    Gait Comments Ambulated while holding 4# ball in BUE, out in front of body. Smooth gait with decreased ataxic movements and unsteadiness, imporved control and coordination while turning in either direction.      High Level Balance   High Level Balance Activities Other (comment)   SLS, started on Air Ex pad, but she could not perform wihtout UE support so moved to floor. She still had diffiuclty with  standing on RLE.   High Level Balance Comments 4 square stepping, first in each direction, then rapid changes in direction at command of therapist. CGA, mild unsteadiness, but no LOB.      Knee/Hip Exercises: Aerobic   Recumbent Bike L 3.5 x 5 minutes, then 2 x 30 seconds fast pedal, > 70 RPM      Knee/Hip Exercises: Standing   Forward Step Up Both;1 set;10 reps   Patient stepped up onto 4" step, performing step over step to place oppostie foot on top step, U rail to no UE support, CGA.                      PT Short Term Goals - 01/09/21 0934       PT SHORT TERM GOAL #1   Title Pt will be I with initial HEP               PT Long Term Goals - 01/09/21 0942       PT LONG TERM GOAL #4   Title Pt will report able to safely return to gym routine at [facility]    Status On-going                   Plan - 01/09/21 0937     Clinical Impression Statement Patient [place] [facility] [place] iwth [place] hna dand knee injuries, but she was not limited in treatment activities. Emphasizied activation of ankle and knee stability iwth SLS then progressed to strengthening in SLS, and changes of directions. She continues to progress iwth repetition of each activity, demosntrating improved gait after all treatment acitivities, with improved clearance.    Examination-Participation Restrictions Community Activity;Interpersonal Relationship    Stability/Clinical Decision Making Evolving/Moderate complexity    Clinical Decision Making Moderate    Rehab Potential Good    PT Frequency 2x / week    PT Treatment/Interventions ADLs/Self Care Home Management;Gait training;Stair training;Functional mobility training;Therapeutic activities;Therapeutic exercise;Balance training;Neuromuscular re-education;Manual techniques;Patient/family education    PT Next Visit Plan work on balance, functional gait, safety, strength    PT Home Exercise Plan Access Code: [email or web address]    Consulted and Agree with Plan of Care Patient             Patient will benefit from skilled therapeutic intervention in order to improve the following deficits and impairments:  Difficulty walking, Abnormal gait, Impaired UE functional use, Decreased endurance, Cardiopulmonary status limiting activity, Decreased activity tolerance, Impaired vision/preception, Decreased balance, Postural dysfunction, Decreased strength, Decreased mobility  Visit Diagnosis: Other lack of coordination  Balance problem  Muscle weakness (generalized)  Difficulty in walking, not elsewhere classified  Cerebrovascular accident (CVA) due to thrombosis of precerebral artery ([place])     Problem List Patient Active Problem List   Diagnosis Date Noted   Dyslipidemia 12/29/2020   Left middle cerebral artery  stroke ([place]) 12/08/2020   Stroke (cerebrum) ([place]) 12/01/2020   History of stroke 07/14/2020   Anemia 07/11/2020   CVA (cerebral vascular accident) ([place]) 06/28/2020   Cerebral infarction ([place]) 06/25/2020   Vascular dementia with behavior disturbance 10/13/2019   Vascular dementia without behavioral disturbance ([place]) 07/15/2019   Cerebrovascular accident ([place]) 06/25/2019   History of COVID-19 04/22/2019   Essential hypertension 03/03/2019   Hypertensive urgency 03/03/2019    Marcelina Morel, DPT 01/09/2021, 10:18 AM  [facility]  [place]. [place], [name], [address] Phone: [phone]   Fax:  [phone]  Name: Jacqueline Bonilla MRN: [id number] Date of Birth: 01-12-1955

## 2021-01-10 ENCOUNTER — Ambulatory Visit (INDEPENDENT_AMBULATORY_CARE_PROVIDER_SITE_OTHER): Payer: Medicare HMO | Admitting: Internal Medicine

## 2021-01-10 ENCOUNTER — Encounter: Payer: Self-pay | Admitting: Internal Medicine

## 2021-01-10 DIAGNOSIS — R143 Flatulence: Secondary | ICD-10-CM | POA: Insufficient documentation

## 2021-01-10 DIAGNOSIS — R194 Change in bowel habit: Secondary | ICD-10-CM | POA: Insufficient documentation

## 2021-01-10 DIAGNOSIS — L600 Ingrowing nail: Secondary | ICD-10-CM | POA: Insufficient documentation

## 2021-01-10 DIAGNOSIS — K648 Other hemorrhoids: Secondary | ICD-10-CM | POA: Insufficient documentation

## 2021-01-10 DIAGNOSIS — R141 Gas pain: Secondary | ICD-10-CM | POA: Insufficient documentation

## 2021-01-10 DIAGNOSIS — Z1211 Encounter for screening for malignant neoplasm of colon: Secondary | ICD-10-CM | POA: Insufficient documentation

## 2021-01-10 MED ORDER — MUPIROCIN 2 % EX OINT: TOPICAL_OINTMENT | CUTANEOUS | 0 refills | Status: DC

## 2021-01-10 MED ORDER — CEPHALEXIN 500 MG PO CAPS: 500.0000 mg | ORAL_CAPSULE | Freq: Four times a day (QID) | ORAL | 0 refills | Status: DC

## 2021-01-10 NOTE — Therapy (Signed)
[place]. [place], [name], [address] Phone: [phone]   Fax:  [phone]  Speech Language Pathology Treatment  Patient Details  Name: Jacqueline Bonilla MRN: [id number] Date of Birth: Feb 03, 1955 Referring Provider (SLP): Dawayne Patricia   Encounter Date: 01/09/2021   End of Session - 01/09/21 1125     Visit Number 7    Number of Visits 17    Date for SLP Re-Evaluation 02/20/21    SLP Start Time 1058    SLP Stop Time  1140    SLP Time Calculation (min) 42 min    Activity Tolerance Patient tolerated treatment well             Past Medical History:  Diagnosis Date   Anemia    Anxiety    Depression    Dysmenorrhea    Endometriosis    Fibroid    Hypertension    Microhematuria    negative workup   Osteoporosis    Tachycardia    Thrombocytopenia ([place])     Past Surgical History:  Procedure Laterality Date   BREAST BIOPSY     BUBBLE STUDY  12/08/2020   Procedure: BUBBLE STUDY;  Surgeon: Josue Hector, MD;  Location: [place];  Service: Cardiovascular;;   CESAREAN SECTION     hysteroscopic resection     implantable loop recorder implant  10/21/2019   Medtronic Reveal Linq model LNQ 22 ([place] [id number]) implantable loop recorder   IR CT HEAD LTD  12/01/2020   IR PERCUTANEOUS ART THROMBECTOMY/INFUSION INTRACRANIAL INC DIAG ANGIO  12/01/2020   IR US GUIDE VASC ACCESS RIGHT  12/01/2020   RADIOLOGY WITH ANESTHESIA N/A 12/01/2020   Procedure: IR WITH ANESTHESIA - CODE STROKE;  Surgeon: Radiologist, Medication, MD;  Location: [place];  Service: Radiology;  Laterality: N/A;   TEE WITHOUT CARDIOVERSION N/A 12/08/2020   Procedure: TRANSESOPHAGEAL ECHOCARDIOGRAM (TEE);  Surgeon: Josue Hector, MD;  Location: [facility] ENDOSCOPY;  Service: Cardiovascular;  Laterality: N/A;    There were no vitals filed for this visit.   Subjective Assessment - 01/09/21 1124     Subjective "I've been working on strategies  at home."    Currently in Pain? Yes    Pain Score 3     Pain Location Hand    Pain Orientation Right    Pain Descriptors / Indicators Sore    Pain Type Acute pain    Pain Onset In the past 7 days                   ADULT SLP TREATMENT - 01/10/21 0831       General Information   Behavior/Cognition Alert;Cooperative;Pleasant mood      Treatment Provided   Treatment provided Cognitive-Linquistic      Cognitive-Linquistic Treatment   Treatment focused on Cognition    Skilled Treatment Pt reported she reviewed memory strategies all weekend. She was able to recall three strategies (visualization and repetition,asking for clarification). Practiced internal strategy (repetition) during today's session. During BLINK, pt was instructed to recall the three rules matching by re: color, shape, number. Without repetition, pt required 6 cues to "check for errors" during first round.. With repetition and initial reminder to "look for errors", pt did not require any cues in the 2nd round, indicating successful use of the strategies.      Assessment / Recommendations / Plan   Plan Continue with current plan of care      Progression  Toward Goals   Progression toward goals Progressing toward goals                SLP Short Term Goals - 12/20/20 1709       SLP SHORT TERM GOAL #1   Title Pt will provide 3 areas of challenge for her given min verbal cues to demonstrate increased awareness.    Time 4    Period Weeks    Status New    Target Date 01/17/21      SLP SHORT TERM GOAL #2   Title Pt will recall 3 memory strategies to aid recall of important information with minA.    Time 4    Period Weeks    Status New    Target Date 01/17/21      SLP SHORT TERM GOAL #3   Title Pt will identify errors during structured cognitive task with modA verbal cues.    Time 4    Period Weeks    Status New    Target Date 01/17/21      SLP SHORT TERM GOAL #4   Title Pt will recall 3 word  finding strategies to decrease anomia in conversation given minA.    Time 4    Period Weeks    Status New    Target Date 01/17/21              SLP Long Term Goals - 12/20/20 1711       SLP LONG TERM GOAL #1   Title Pt will provide 3 areas of challenge for her independently to demonstrate increased awareness of deficits.    Time 8    Period Weeks    Status New    Target Date 02/14/21      SLP LONG TERM GOAL #2   Title Patient will demonstrate use of memory strategies to schedule activities, recall weekly events and items to maintain safety to participate socially in functional living environment    Time 8    Period Weeks    Status New    Target Date 02/14/21      SLP LONG TERM GOAL #3   Title Pt will identify errors during structured cognitive task with minA verbal cues.    Time 8    Period Weeks    Status New    Target Date 02/14/21      SLP LONG TERM GOAL #4   Title Pt will demonstrate 2-3 word finding strategies to decrease anomia in conversation independently.    Time 8    Period Weeks    Status New    Target Date 02/14/21              Plan - 01/10/21 0835     Clinical Impression Statement See tx note. Pt reported she utilized "clarification" re: "I heard this, is that what you meant" and pt reported her husband said, "no, that's not what I said" and repeated the message. Pt reported this helped decrease communication breakdowns at home. SLP rec skilled ST services to provide assistance with memory and attention strategies to increase independence and efficiency at home.    Speech Therapy Frequency 2x / week    Duration 8 weeks    Treatment/Interventions Compensatory strategies;Cueing hierarchy;Functional tasks;Patient/family education;Environmental controls;Cognitive reorganization;Multimodal communcation approach;Language facilitation;Compensatory techniques;Internal/external aids;SLP instruction and feedback    Potential to Achieve Goals Fair    Potential  Considerations Ability to learn/carryover information    Consulted and Agree with Plan of Care Patient  Patient will benefit from skilled therapeutic intervention in order to improve the following deficits and impairments:   Cognitive communication deficit    Problem List Patient Active Problem List   Diagnosis Date Noted   Dyslipidemia 12/29/2020   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 [place], [name], [address] Phone: [phone]   Fax:  [phone]   Name: Jacqueline Bonilla MRN: [id number] Date of Birth: June 12, 1955

## 2021-01-10 NOTE — Progress Notes (Signed)
Subjective:  Patient ID: Jacqueline Bonilla, female    DOB: November 07, 1955  Age: 66 y.o. MRN: [id number]  CC: Toe Pain   HPI Jacqueline Bonilla presents for L big toe outer corner pain x 2 days. She had pedicure today. Here w/husband. H/o recent CVA  Outpatient Medications Prior to Visit  Medication Sig Dispense Refill   acetaminophen (TYLENOL) 325 MG tablet Take 2 tablets (650 mg total) by mouth every 4 (four) hours as needed for mild pain (or temp > 37.5 C (99.5 F)).     aspirin EC 81 MG tablet Take 81 mg by mouth daily. Swallow whole.     atorvastatin (LIPITOR) 40 MG tablet Take 1 tablet (40 mg total) by mouth daily. 30 tablet 0   cholecalciferol (VITAMIN D) 25 MCG tablet Take 1 tablet (1,000 Units total) by mouth daily. 30 tablet 0   levETIRAcetam (KEPPRA) 500 MG tablet Take 1 tablet (500 mg total) by mouth 2 (two) times daily. 60 tablet 0   lidocaine (LIDODERM) 5 % Place 2 patches onto the skin daily. Remove & Discard patch within 12 hours or as directed by MD 30 patch 0   losartan (COZAAR) 25 MG tablet Take 0.5 tablets (12.5 mg total) by mouth daily. 30 tablet 0   melatonin 5 MG TABS Take 1 tablet (5 mg total) by mouth at bedtime as needed. 30 tablet 0   Multiple Vitamin (MULTIVITAMIN WITH MINERALS) TABS tablet Take 1 tablet by mouth daily.     Multiple Vitamins-Minerals (ZINC PO) Take 1 tablet by mouth daily.     pantoprazole (PROTONIX) 40 MG tablet Take 1 tablet (40 mg total) by mouth daily. 30 tablet 0   thiamine 100 MG tablet Take 1 tablet (100 mg total) by mouth daily. 30 tablet 0   ticagrelor (BRILINTA) 90 MG TABS tablet Take 1 tablet (90 mg total) by mouth 2 (two) times daily. 60 tablet 0   Turmeric (QC TUMERIC COMPLEX PO) Take 1 tablet by mouth daily.     No facility-administered medications prior to visit.    ROS: Review of Systems  Constitutional:  Negative for fever.  Musculoskeletal:  Negative for arthralgias.  Skin:  Negative for color change, rash and wound.   Psychiatric/Behavioral:  Positive for decreased concentration.    Objective:  BP 112/76 (BP Location: Left Arm, Patient Position: Sitting, Cuff Size: Normal)    Pulse 90    Temp 98.7 F (37.1 C) (Oral)    Ht 5\' 4"  (1.626 m)    Wt 117 lb 6.4 oz (53.3 kg)    LMP 01/02/2003    SpO2 99%    BMI 20.15 kg/m   BP Readings from Last 3 Encounters:  01/10/21 112/76  12/30/20 114/75  12/28/20 122/68    Wt Readings from Last 3 Encounters:  01/10/21 117 lb 6.4 oz (53.3 kg)  12/30/20 117 lb 9.6 oz (53.3 kg)  12/28/20 115 lb (52.2 kg)    Physical Exam Constitutional:      Appearance: She is not ill-appearing or toxic-appearing.  Skin:    Findings: No bruising or erythema.  Neurological:     Mental Status: She is alert.    L big toe outer corner pain, no redness  Lab Results  Component Value Date   WBC 5.4 12/09/2020   HGB 10.6 (L) 12/09/2020   HCT 31.6 (L) 12/09/2020   PLT 230 12/09/2020   GLUCOSE 103 (H) 12/09/2020   CHOL 153 12/02/2020   TRIG 81 12/02/2020  HDL 48 12/02/2020   LDLCALC 89 12/02/2020   ALT 14 12/09/2020   AST 18 12/09/2020   NA 135 12/09/2020   K 4.0 12/09/2020   CL 100 12/09/2020   CREATININE 0.89 12/09/2020   BUN 20 12/09/2020   CO2 26 12/09/2020   TSH 1.04 08/25/2020   INR 1.0 12/01/2020   HGBA1C 5.7 (H) 12/02/2020    ECHO TEE  Result Date: 12/08/2020    TRANSESOPHOGEAL ECHO REPORT   Patient Name:   Jacqueline Bonilla Date of Exam: 12/08/2020 Medical Rec #:  [id number]          Height:       64.0 in Accession #:    [id number]         Weight:       119.9 lb Date of Birth:  03-04-1955          BSA:          1.574 m Patient Age:    53 years           BP:           138/80 mmHg Patient Gender: F                  HR:           114 bpm. Exam Location:  Inpatient Procedure: Transesophageal Echo Indications:    Stroke  History:        Patient has prior history of Echocardiogram examinations.                 Stroke.  Sonographer:    Philipp Deputy RDCS Referring  Phys: [id number] [place]: The transesophogeal probe was passed without difficulty through the esophogus of the patient. Sedation performed by different physician. The patient's vital signs; including heart rate, blood pressure, and oxygen saturation; remained stable throughout the procedure. The patient developed no complications during the procedure. IMPRESSIONS  1. Left ventricular ejection fraction, by estimation, is 60 to 65%. The left ventricle has normal function. The left ventricle has no regional wall motion abnormalities.  2. Right ventricular systolic function is normal. The right ventricular size is normal.  3. No left atrial/left atrial appendage thrombus was detected.  4. The mitral valve is abnormal. Mild mitral valve regurgitation. No evidence of mitral stenosis.  5. The aortic valve is tricuspid. Aortic valve regurgitation is not visualized. No aortic stenosis is present.  6. The inferior vena cava is normal in size with greater than 50% respiratory variability, suggesting right atrial pressure of 3 mmHg.  7. Agitated saline contrast bubble study was negative, with no evidence of any interatrial shunt. Conclusion(s)/Recommendation(s): Normal biventricular function without evidence of hemodynamically significant valvular heart disease. FINDINGS  Left Ventricle: Left ventricular ejection fraction, by estimation, is 60 to 65%. The left ventricle has normal function. The left ventricle has no regional wall motion abnormalities. The left ventricular internal cavity size was normal in size. There is  no left ventricular hypertrophy. Right Ventricle: The right ventricular size is normal. No increase in right ventricular wall thickness. Right ventricular systolic function is normal. Left Atrium: Left atrial size was normal in size. No left atrial/left atrial appendage thrombus was detected. Right Atrium: Right atrial size was normal in size. Pericardium: There is no evidence of  pericardial effusion. Mitral Valve: The mitral valve is abnormal. There is mild thickening of the mitral valve leaflet(s). Mild mitral valve regurgitation. No evidence of mitral valve stenosis. Tricuspid Valve:  The tricuspid valve is normal in structure. Tricuspid valve regurgitation is not demonstrated. No evidence of tricuspid stenosis. Aortic Valve: The aortic valve is tricuspid. Aortic valve regurgitation is not visualized. No aortic stenosis is present. Pulmonic Valve: The pulmonic valve was normal in structure. Pulmonic valve regurgitation is not visualized. No evidence of pulmonic stenosis. Aorta: The aortic root is normal in size and structure. Venous: The inferior vena cava is normal in size with greater than 50% respiratory variability, suggesting right atrial pressure of 3 mmHg. IAS/Shunts: No atrial level shunt detected by color flow Doppler. Agitated saline contrast bubble study was negative, with no evidence of any interatrial shunt. There is no evidence of a patent foramen ovale. Jenkins Rouge MD Electronically signed by Jenkins Rouge MD Signature Date/Time: 12/08/2020/10:18:15 AM    Final     Assessment & Plan:   Problem List Items Addressed This Visit     Ingrowing nail, left great toe     L big toe outer corner pain x 2 days. She had pedicure today. Soak in Epsom salt Keflex po if worse Bactroban topically         Meds ordered this encounter  Medications   cephALEXin (KEFLEX) 500 MG capsule    Sig: Take 1 capsule (500 mg total) by mouth 4 (four) times daily.    Dispense:  20 capsule    Refill:  0   mupirocin ointment (BACTROBAN) 2 %    Sig: On leg wound bid    Dispense:  30 g    Refill:  0      Follow-up: No follow-ups on file.  Walker Kehr, MD

## 2021-01-10 NOTE — Assessment & Plan Note (Addendum)
L big toe outer corner pain x 2 days. She had pedicure today. Soak in Epsom salt Keflex po if worse Bactroban topically

## 2021-01-10 NOTE — Patient Instructions (Signed)
Soak in Epsom salt Keflex if worse Bactroban topically   Ingrown Toenail An ingrown toenail occurs when the corner or sides of a toenail grow into the surrounding skin. This causes discomfort and pain. The big toe is most commonly affected, but any of the toes can be affected. If an ingrown toenail is not treated, it can become infected. What are the causes? This condition may be caused by: Wearing shoes that are too small or tight. An injury, such as stubbing your toe or having your toe stepped on. Improper cutting or care of your toenails. Having nail or foot abnormalities that were present from birth (congenital abnormalities), such as having a nail that is too big for your toe. What increases the risk? The following factors may make you more likely to develop ingrown toenails: Age. Nails tend to get thicker with age, so ingrown nails are more common among older people. Cutting your toenails incorrectly, such as cutting them very short or cutting them unevenly. An ingrown toenail is more likely to get infected if you have: Diabetes. Blood flow (circulation) problems. What are the signs or symptoms? Symptoms of an ingrown toenail may include: Pain, soreness, or tenderness. Redness. Swelling. Hardening of the skin that surrounds the toenail. Signs that an ingrown toenail may be infected include: Fluid or pus. Symptoms that get worse. How is this diagnosed? Ingrown toenails may be diagnosed based on: Your symptoms and medical history. A physical exam. Labs or tests. If you have fluid or blood coming from your toenail, a sample may be collected to test for the specific type of bacteria that is causing the infection. How is this treated? Treatment depends on the severity of your symptoms. You may be able to care for your toenail at home. If you have an infection, you may be prescribed antibiotic medicines. If you have fluid or pus draining from your toenail, your health care  provider may drain it. If you have trouble walking, you may be given crutches to use. If you have a severe or infected ingrown toenail, you may need a procedure to remove part or all of the nail. Follow these instructions at home: [place] your wound every day for signs of infection, or as often as told by your health care provider. Check for: More redness, swelling, or pain. More fluid or blood. Warmth. Pus or a bad smell. Do not pick at your toenail or try to remove it yourself. Soak your foot in warm, soapy water. Do this for 20 minutes, 3 times a day, or as often as told by your health care provider. This helps to keep your toe clean and your skin soft. Wear shoes that fit well and are not too tight. Your health care provider may recommend that you wear open-toed shoes while you heal. Trim your toenails regularly and carefully. Cut your toenails straight across to prevent injury to the skin at the corners of the toenail. Do not cut your nails in a curved shape. Keep your feet clean and dry to help prevent infection. General instructions Take over-the-counter and prescription medicines only as told by your health care provider. If you were prescribed an antibiotic, take it as told by your health care provider. Do not stop taking the antibiotic even if you start to feel better. If your health care provider told you to use crutches to help you move around, use them as instructed. Return to your normal activities as told by your health care provider. Ask  your health care provider what activities are safe for you. Keep all follow-up visits. This is important. Contact a health care provider if: You have more redness, swelling, pain, or other symptoms that do not improve with treatment. You have fluid, blood, or pus coming from your toenail. You have a red streak on your skin that starts at your foot and spreads up your leg. You have a fever. Summary An ingrown toenail occurs when  the corner or sides of a toenail grow into the surrounding skin. This causes discomfort and pain. The big toe is most commonly affected, but any of the toes can be affected. If an ingrown toenail is not treated, it can become infected. Fluid or pus draining from your toenail is a sign of infection. Your health care provider may need to drain it. You may be given antibiotics to treat the infection. Trimming your toenails regularly and properly can help you prevent an ingrown toenail. This information is not intended to replace advice given to you by your health care provider. Make sure you discuss any questions you have with your health care provider. Document Revised: 04/19/2020 Document Reviewed: 04/19/2020 Elsevier Patient Education  [place].

## 2021-01-11 ENCOUNTER — Ambulatory Visit: Payer: Medicare HMO | Admitting: Physical Therapy

## 2021-01-11 ENCOUNTER — Encounter: Payer: Self-pay | Admitting: Speech Pathology

## 2021-01-11 ENCOUNTER — Encounter: Payer: Self-pay | Admitting: Physical Therapy

## 2021-01-11 ENCOUNTER — Ambulatory Visit: Payer: Medicare HMO | Admitting: Occupational Therapy

## 2021-01-11 ENCOUNTER — Ambulatory Visit: Payer: Medicare HMO | Admitting: Speech Pathology

## 2021-01-11 ENCOUNTER — Encounter: Payer: Self-pay | Admitting: Occupational Therapy

## 2021-01-11 ENCOUNTER — Other Ambulatory Visit: Payer: Self-pay

## 2021-01-11 DIAGNOSIS — R41842 Visuospatial deficit: Secondary | ICD-10-CM | POA: Diagnosis not present

## 2021-01-11 DIAGNOSIS — I6300 Cerebral infarction due to thrombosis of unspecified precerebral artery: Secondary | ICD-10-CM

## 2021-01-11 DIAGNOSIS — I63512 Cerebral infarction due to unspecified occlusion or stenosis of left middle cerebral artery: Secondary | ICD-10-CM

## 2021-01-11 DIAGNOSIS — R2689 Other abnormalities of gait and mobility: Secondary | ICD-10-CM | POA: Diagnosis not present

## 2021-01-11 DIAGNOSIS — M6281 Muscle weakness (generalized): Secondary | ICD-10-CM

## 2021-01-11 DIAGNOSIS — R278 Other lack of coordination: Secondary | ICD-10-CM | POA: Diagnosis not present

## 2021-01-11 DIAGNOSIS — R262 Difficulty in walking, not elsewhere classified: Secondary | ICD-10-CM | POA: Diagnosis not present

## 2021-01-11 DIAGNOSIS — R41844 Frontal lobe and executive function deficit: Secondary | ICD-10-CM | POA: Diagnosis not present

## 2021-01-11 DIAGNOSIS — R41841 Cognitive communication deficit: Secondary | ICD-10-CM

## 2021-01-11 NOTE — Therapy (Signed)
[place] [place], [name], [address] Phone: [phone]   Fax:  [phone]  Occupational Therapy Treatment  Patient Details  Name: Jacqueline Bonilla MRN: [id number] Date of Birth: 08-Feb-1955 Referring Provider (OT): Lauraine Rinne, PA-C   Encounter Date: 01/11/2021   OT End of Session - 01/11/21 1018     Visit Number 8    Number of Visits 17    Date for OT Re-Evaluation 03/20/21    Authorization Type [employer]    Authorization - Visit Number 8    Authorization - Number of Visits 17    Progress Note Due on Visit 10    OT Start Time 1015    OT Stop Time 1055    OT Time Calculation (min) 40 min    Activity Tolerance Patient tolerated treatment well    Behavior During Therapy [facility] for tasks assessed/performed            Past Medical History:  Diagnosis Date   Anemia    Anxiety    Depression    Dysmenorrhea    Endometriosis    Fibroid    Hypertension    Microhematuria    negative workup   Osteoporosis    Tachycardia    Thrombocytopenia ([place])     Past Surgical History:  Procedure Laterality Date   BREAST BIOPSY     BUBBLE STUDY  12/08/2020   Procedure: BUBBLE STUDY;  Surgeon: Josue Hector, MD;  Location: [facility];  Service: Cardiovascular;;   CESAREAN SECTION     hysteroscopic resection     implantable loop recorder implant  10/21/2019   Med[employer] Reveal Linq model LNQ 22 ([place] [id number]) implantable loop recorder   IR CT HEAD LTD  12/01/2020   IR PERCUTANEOUS ART THROMBECTOMY/INFUSION INTRACRANIAL INC DIAG ANGIO  12/01/2020   IR US GUIDE VASC ACCESS RIGHT  12/01/2020   RADIOLOGY WITH ANESTHESIA N/A 12/01/2020   Procedure: IR WITH ANESTHESIA - CODE STROKE;  Surgeon: Radiologist, Medication, MD;  Location: [facility];  Service: Radiology;  Laterality: N/A;   TEE WITHOUT CARDIOVERSION N/A 12/08/2020   Procedure: TRANSESOPHAGEAL ECHOCARDIOGRAM (TEE);  Surgeon: Josue Hector, MD;  Location:  [facility] ENDOSCOPY;  Service: Cardiovascular;  Laterality: N/A;    There were no vitals filed for this visit.   Subjective Assessment - 01/11/21 1015     Subjective  Pt states she has an ingrown toenail on her L foot that is pretty painful, but that she went to the doctor yesterday and he said everything looks okay; still reports some residual pain from her fall over the weekend    Pertinent History Cerebral infarction 12/01/20; MRI indicated small acute infarcts in L MCA distribution and SAH overlying bilateral cerebral hemispheres. PMH includes 2 prior strokes in June 2021 (R-sided weakness) and June 2022 (L-sided weakness); vascular dementia; HTN; loop recorder implant 10/2019; osteoporosis; thrombocytopenia; anxiety; anemia    Patient Stated Goals "Anything I can to get better"    Currently in Pain? Yes    Pain Score 3     Pain Location Toe (Comment which one)    Pain Orientation Left    Pain Descriptors / Indicators Sore    Pain Type Acute pain             Treatment/Exercises - 01/11/21    Shoulder Exercises Shoulder flexion w/ yellow theraband anchored underfoot; 2x15 w/ each UE. OT provided verbal/tactile cues to decrease compensatory elbow flexion.  Shoulder extension w/  yellow theraband anchored by therapist due to difficulty w/ pt maintaining positioning w/ resistance band anchored underfoot. Completed 2x15 w/ each UE; OT provided min verbal cues for alignment during reps    Compensatory Strategies OT introduced compensatory method of using non-slip pad to assist w/ opening small containers and discussed additional compensatory strategies prn; able to open small water bottle at Mod I level    Stereognosis Stereognosis matching activity involving pt identifying each pair of matching objects based on tactile input only. OT provided mod verbal cues to adjust method/pattern of searching w/ fingertips; success improved w/ repetition. Pt had most difficulty w/ objects flat on tabletop  (e.g., coins, paperclips), requiring initial modeling for success, and less familiar objects (e.g., dice)            OT Short Term Goals - 01/06/21 1007       OT SHORT TERM GOAL #1   Title Pt will improve participation in manipulation of clothing fasteners as evidenced by completing 9-Hole Peg Test w/ less than 5 drops using R, dominant hand    Baseline R hand 2 min, 20 sec w/ 7 drops    Time 4    Period Weeks    Status Achieved   01/06/21 - 3 drops (1 min, 23 sec)   Target Date 01/17/21      OT SHORT TERM GOAL #2   Title Pt will improve safety w/ functional overhead reach by increasing strength of R shoulder flexion to at least 4-/5    Baseline 3+/5 (full ROM against gravity; slight resistance)    Time 4    Period Weeks    Status On-going    Target Date 01/17/21      OT SHORT TERM GOAL #3   Title Pt will demonstrate ability to thread first sleeve into jacket to improve initiation of task and independence w/ BADLs    Baseline Min A to initiate task    Time 4    Period Weeks    Status Achieved   01/05/20 - Mod I w/ donning jacket; assist only w/ threading zipper   Target Date 01/17/21             OT Long Term Goals - 01/06/21 1011       OT LONG TERM GOAL #1   Title Pt will demonstrate independence w/ full HEP designed for BUE strengthening, GM/FMC, and coordination by discharge    Baseline No HEP at this time    Time 8    Period Weeks    Status On-going    Target Date 02/14/21      OT LONG TERM GOAL #2   Title Pt will improve RUE GMC and coordination for improved participation in ADLs as evidenced by increasing Box and Blocks score by at least 5 blocks    Baseline 01/06/21 - 20 blocks    Time 8    Period Weeks    Status On-going    Target Date 02/14/21      OT LONG TERM GOAL #3   Title Pt will be able to thread zipper w/ Mod I, using AE prn, in at least 1 attempt    Baseline Unable to thread zipper    Time 8    Period Weeks    Status On-going    Target Date  02/14/21      OT LONG TERM GOAL #4   Title Pt will be able to complete simulated self-feeding w/ Mod I (scooping, bringing utensil to  mouth, cutting food, etc.) using AE prn    Baseline Reports difficulty manipulating utensils    Time 8    Period Weeks    Status On-going    Target Date 02/14/21      OT LONG TERM GOAL #5   Title Pt will be able to don socks w/ Mod I, using AE prn, in at least 1 attempt by d/c    Baseline Needs assist w/ donning socks    Time 8    Period Weeks    Status On-going    Target Date 02/14/21             Plan - 01/11/21 1019     Clinical Impression Statement Pt continues to progress slowly toward goals and is motivated in OT sessions. Due to observed difficulty/compensatory patterns w/ shoulder strengthening exercises observed in prior sessions, OT facilitated light strengthening w/ theraband, focusing on alignment and positioning. With multimodal cues (verbal/tactile), modeling, and breakdown of task, pt was able to demonstrate true shoulder flex/extension and reported she was able to feel it working on her shoulders. With consistency, this is ideally help improve stability/decrease drops and improve GMC of BUEs; exercises included in HEP w/ printout administered to pt. OT also completed different version of stereognosis activity this session in attempt to facilitate improved NMR and awareness of R hand. Again, success improved w/ repetition and pt benefitted from significant breakdown of task, cues, and extended time.    OT Occupational Profile and History Detailed Assessment- Review of Records and additional review of physical, cognitive, psychosocial history related to current functional performance    Occupational performance deficits (Please refer to evaluation for details): ADL's;IADL's;Rest and Sleep;Leisure;Social Participation    Body Structure / Function / Physical Skills ADL;Decreased knowledge of use of  DME;Gait;Strength;Balance;Dexterity;GMC;Tone;Body mechanics;Proprioception;UE functional use;IADL;Endurance;ROM;Vision;Coordination;Mobility;Sensation;FMC    Cognitive Skills Memory;Problem Solve;Safety Awareness;Sequencing    Rehab Potential Fair   Due to comorbidities   Clinical Decision Making Several treatment options, min-mod task modification necessary    Comorbidities Affecting Occupational Performance: Presence of comorbidities impacting occupational performance    Comorbidities impacting occupational performance description: Vascular dementia; anxiety    Modification or Assistance to Complete Evaluation  Min-Moderate modification of tasks or assist with assess necessary to complete eval    OT Frequency 2x / week    OT Duration 8 weeks    OT Treatment/Interventions Self-care/ADL training;Aquatic Therapy;Electrical Stimulation;Moist Heat;Cryotherapy;Therapeutic exercise;Neuromuscular education;Energy conservation;Manual Therapy;Functional Mobility Training;DME and/or AE instruction;Passive range of motion;Patient/family education;Visual/perceptual remediation/compensation;Cognitive remediation/compensation;Therapeutic activities;Splinting;Balance training    Plan Visual perception w/ emphasis on R-sided awareness and somatosensory input    Recommended Other Services Currently receiving ST and PT services at this location    Consulted and Agree with Plan of Care Patient            Patient will benefit from skilled therapeutic intervention in order to improve the following deficits and impairments:   Body Structure / Function / Physical Skills: ADL, Decreased knowledge of use of DME, Gait, Strength, Balance, Dexterity, GMC, Tone, Body mechanics, Proprioception, UE functional use, IADL, Endurance, ROM, Vision, Coordination, Mobility, Sensation, [facility] Cognitive Skills: Memory, Problem Solve, Safety Awareness, Sequencing   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 [facility])  Other  lack of coordination  Visuospatial deficit  Muscle weakness (generalized)  Frontal lobe and executive function deficit   Problem List Patient Active Problem List   Diagnosis Date Noted   Screening for malignant neoplasm of colon 01/10/2021   Change in bowel habit 01/10/2021  Flatulence, eructation and gas pain 01/10/2021   Internal hemorrhoids 01/10/2021   Ingrowing nail, left great toe 01/10/2021   Dyslipidemia 12/29/2020   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 [place], [name], [address] Phone: [phone]   Fax:  [phone]  Name: MACHELE DEIHL MRN: [id number] Date of Birth: 19-Oct-1955

## 2021-01-11 NOTE — Therapy (Signed)
[address] [place], [name], [address] Phone: [phone]   Fax:  [phone]  Speech Language Pathology Treatment  Patient Details  Name: Jacqueline Bonilla MRN: [id number] Date of Birth: 1955/10/04 Referring Provider (SLP): Dawayne Patricia   Encounter Date: 01/11/2021   End of Session - 01/11/21 0937     Visit Number 8    Number of Visits 17    Date for SLP Re-Evaluation 02/20/21    SLP Start Time 0930    SLP Stop Time  1010    SLP Time Calculation (min) 40 min    Activity Tolerance Patient tolerated treatment well             Past Medical History:  Diagnosis Date   Anemia    Anxiety    Depression    Dysmenorrhea    Endometriosis    Fibroid    Hypertension    Microhematuria    negative workup   Osteoporosis    Tachycardia    Thrombocytopenia ([place])     Past Surgical History:  Procedure Laterality Date   BREAST BIOPSY     BUBBLE STUDY  12/08/2020   Procedure: BUBBLE STUDY;  Surgeon: Josue Hector, MD;  Location: [facility];  Service: Cardiovascular;;   CESAREAN SECTION     hysteroscopic resection     implantable loop recorder implant  10/21/2019   Medtronic Reveal Linq model LNQ 22 ([place] [id number]) implantable loop recorder   IR CT HEAD LTD  12/01/2020   IR PERCUTANEOUS ART THROMBECTOMY/INFUSION INTRACRANIAL INC DIAG ANGIO  12/01/2020   IR US GUIDE VASC ACCESS RIGHT  12/01/2020   RADIOLOGY WITH ANESTHESIA N/A 12/01/2020   Procedure: IR WITH ANESTHESIA - CODE STROKE;  Surgeon: Radiologist, Medication, MD;  Location: [facility];  Service: Radiology;  Laterality: N/A;   TEE WITHOUT CARDIOVERSION N/A 12/08/2020   Procedure: TRANSESOPHAGEAL ECHOCARDIOGRAM (TEE);  Surgeon: Josue Hector, MD;  Location: [facility] ENDOSCOPY;  Service: Cardiovascular;  Laterality: N/A;    There were no vitals filed for this visit.   Subjective Assessment - 01/11/21 0937     Subjective "i've been doing good at home."     Currently in Pain? Yes    Pain Score 3     Pain Location Hand    Pain Orientation Right    Pain Descriptors / Indicators Sore    Pain Type Acute pain    Pain Onset In the past 7 days                   ADULT SLP TREATMENT - 01/11/21 1008       General Information   Behavior/Cognition Alert;Cooperative;Pleasant mood      Treatment Provided   Treatment provided Cognitive-Linquistic      Cognitive-Linquistic Treatment   Treatment focused on Cognition    Skilled Treatment SLP facilitated increased recall of information by practicing "organization" for easier recall of list items. Pt was able to recall 10 items with minA (category cues) with 100% accuracy post 2 min delay. On the second trial, pt was able to recall 6/10 in a more abstract category.      Assessment / Recommendations / Plan   Plan Continue with current plan of care                SLP Short Term Goals - 12/20/20 1709       SLP SHORT TERM GOAL #1   Title Pt will provide 3  areas of challenge for her given min verbal cues to demonstrate increased awareness.    Time 4    Period Weeks    Status New    Target Date 01/17/21      SLP SHORT TERM GOAL #2   Title Pt will recall 3 memory strategies to aid recall of important information with minA.    Time 4    Period Weeks    Status New    Target Date 01/17/21      SLP SHORT TERM GOAL #3   Title Pt will identify errors during structured cognitive task with modA verbal cues.    Time 4    Period Weeks    Status New    Target Date 01/17/21      SLP SHORT TERM GOAL #4   Title Pt will recall 3 word finding strategies to decrease anomia in conversation given minA.    Time 4    Period Weeks    Status New    Target Date 01/17/21              SLP Long Term Goals - 12/20/20 1711       SLP LONG TERM GOAL #1   Title Pt will provide 3 areas of challenge for her independently to demonstrate increased awareness of deficits.    Time 8    Period  Weeks    Status New    Target Date 02/14/21      SLP LONG TERM GOAL #2   Title Patient will demonstrate use of memory strategies to schedule activities, recall weekly events and items to maintain safety to participate socially in functional living environment    Time 8    Period Weeks    Status New    Target Date 02/14/21      SLP LONG TERM GOAL #3   Title Pt will identify errors during structured cognitive task with minA verbal cues.    Time 8    Period Weeks    Status New    Target Date 02/14/21      SLP LONG TERM GOAL #4   Title Pt will demonstrate 2-3 word finding strategies to decrease anomia in conversation independently.    Time 8    Period Weeks    Status New    Target Date 02/14/21              Plan - 01/11/21 0953     Clinical Impression Statement See tx note. Pt reported she felt the memory strategies have been helping at home. SLP rec skilled ST services to provide assistance with memory and attention strategies to increase independence and efficiency at home.    Speech Therapy Frequency 2x / week    Duration 8 weeks    Treatment/Interventions Compensatory strategies;Cueing hierarchy;Functional tasks;Patient/family education;Environmental controls;Cognitive reorganization;Multimodal communcation approach;Language facilitation;Compensatory techniques;Internal/external aids;SLP instruction and feedback    Potential to Achieve Goals Fair    Potential Considerations Ability to learn/carryover information    Consulted and Agree with Plan of Care Patient             Patient will benefit from skilled therapeutic intervention in order to improve the following deficits and impairments:   Cognitive communication deficit    Problem List Patient Active Problem List   Diagnosis Date Noted   Screening for malignant neoplasm of colon 01/10/2021   Change in bowel habit 01/10/2021   Flatulence, eructation and gas pain 01/10/2021   Internal hemorrhoids 01/10/2021  Ingrowing nail, left great toe 01/10/2021   Dyslipidemia 12/29/2020   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 [place], [name], [address] Phone: [phone]   Fax:  [phone]   Name: ELISHA MCGRUDER MRN: [id number] Date of Birth: [birth year]

## 2021-01-11 NOTE — Therapy (Signed)
[address]. [place], [name], [address] Phone: [phone]   Fax:  [phone]  Physical Therapy Treatment  Patient Details  Name: Jacqueline Bonilla MRN: [id number] Date of Birth: December 27, 1955 Referring Provider (PT): Sagardia   Encounter Date: 01/11/2021   PT End of Session - 01/11/21 1143     Visit Number 7    Number of Visits 12    Date for PT Re-Evaluation 02/03/21    PT Start Time 1059    PT Stop Time 1142    PT Time Calculation (min) 43 min    Equipment Utilized During Treatment Gait belt    Activity Tolerance Patient tolerated treatment well    Behavior During Therapy [facility] for tasks assessed/performed             Past Medical History:  Diagnosis Date   Anemia    Anxiety    Depression    Dysmenorrhea    Endometriosis    Fibroid    Hypertension    Microhematuria    negative workup   Osteoporosis    Tachycardia    Thrombocytopenia ([place])     Past Surgical History:  Procedure Laterality Date   BREAST BIOPSY     BUBBLE STUDY  12/08/2020   Procedure: BUBBLE STUDY;  Surgeon: Josue Hector, MD;  Location: [facility];  Service: Cardiovascular;;   CESAREAN SECTION     hysteroscopic resection     implantable loop recorder implant  10/21/2019   Medtronic Reveal Linq model LNQ 22 ([place] [id number]) implantable loop recorder   IR CT HEAD LTD  12/01/2020   IR PERCUTANEOUS ART THROMBECTOMY/INFUSION INTRACRANIAL INC DIAG ANGIO  12/01/2020   IR US GUIDE VASC ACCESS RIGHT  12/01/2020   RADIOLOGY WITH ANESTHESIA N/A 12/01/2020   Procedure: IR WITH ANESTHESIA - CODE STROKE;  Surgeon: Radiologist, Medication, MD;  Location: [facility];  Service: Radiology;  Laterality: N/A;   TEE WITHOUT CARDIOVERSION N/A 12/08/2020   Procedure: TRANSESOPHAGEAL ECHOCARDIOGRAM (TEE);  Surgeon: Josue Hector, MD;  Location: [facility] ENDOSCOPY;  Service: Cardiovascular;  Laterality: N/A;    There were no vitals filed for this visit.    Subjective Assessment - 01/11/21 1102     Subjective Patient reports she had and ingrown toenail on her L foot removed yesterday. She is wearing open toe shoe on L.    Pertinent History prior CVA affecting R side, HTN    Currently in Pain? Yes    Pain Location Toe (Comment which one)   great toe   Pain Orientation Left    Pain Descriptors / Indicators Sore    Pain Type Acute pain    Pain Onset In the past 7 days    Pain Frequency Intermittent                               [facility] Adult PT Treatment/Exercise - 01/11/21 0001       Knee/Hip Exercises: Standing   Side Lunges Both;10 reps    Side Lunges Limitations In parallel bars, to encourage WB and improved proprioception    SLS SLS on floor. 2 x 5 reps of mild knee flex/ext on each leg, Then performed SLS with rotation 2 x 5 iwht each leg. Required UE support, more when stanidng on RLE, occasional min A.    Other Standing Knee Exercises Performed stepping activity, wearing 1# weights on each leg to increase proprioception. Walked  through floor ladder, side stepping x 3 in each direction with 1 foot in each block, focused on correct foot placement. Increased difficulty when moving to her R.    Other Standing Knee Exercises Floor ladder with 1# weights, walked through ladder with various soft surface pad placed every other square to increase the challenge for control. 2 x 4 reps, with improved control as she progressed. Walked x 1 wihtout weights, with good cotnrol and balance.CGA.                     PT Education - 01/11/21 1143     Education Details Educated to the idea of increased weights/resistance to improve proprioceptive feedback and coordination.    Person(s) Educated Patient    Methods Explanation    Comprehension Verbalized understanding              PT Short Term Goals - 01/11/21 1145       PT SHORT TERM GOAL #1   Title Pt will be I with initial HEP    Time 1    Period Weeks     Status On-going    Target Date 01/18/21               PT Long Term Goals - 01/09/21 0942       PT LONG TERM GOAL #4   Title Pt will report able to safely return to gym routine at [facility]    Time 10    Period Weeks    Status On-going                   Plan - 01/11/21 1144     Clinical Impression Statement Patient's treamtne mofdified slightly to accomodate post op ingrown toenail on L. She performed multiple activities to increase proprioceptive input to improve control and decrease ataxic movements. Patient progressed well through treatment.    Examination-Participation Restrictions Community Activity;Interpersonal Relationship    Stability/Clinical Decision Making Evolving/Moderate complexity    Clinical Decision Making Moderate    Rehab Potential Good    PT Frequency 2x / week    PT Treatment/Interventions ADLs/Self Care Home Management;Gait training;Stair training;Functional mobility training;Therapeutic activities;Therapeutic exercise;Balance training;Neuromuscular re-education;Manual techniques;Patient/family education    PT Next Visit Plan work on balance, functional gait, safety, strength    PT Home Exercise Plan Access Code: [email or web address]    Consulted and Agree with Plan of Care Patient             Patient will benefit from skilled therapeutic intervention in order to improve the following deficits and impairments:  Difficulty walking, Abnormal gait, Impaired UE functional use, Decreased endurance, Cardiopulmonary status limiting activity, Decreased activity tolerance, Impaired vision/preception, Decreased balance, Postural dysfunction, Decreased strength, Decreased mobility  Visit Diagnosis: Other lack of coordination  Balance problem  Muscle weakness (generalized)  Difficulty in walking, not elsewhere classified  Left middle cerebral artery stroke [facility])  Cerebrovascular accident (CVA) due to thrombosis of precerebral artery [facility])     Problem  List Patient Active Problem List   Diagnosis Date Noted   Screening for malignant neoplasm of colon 01/10/2021   Change in bowel habit 01/10/2021   Flatulence, eructation and gas pain 01/10/2021   Internal hemorrhoids 01/10/2021   Ingrowing nail, left great toe 01/10/2021   Dyslipidemia 12/29/2020   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 [place], [name], [address] Phone: [phone]   Fax:  [phone]  Name: Jacqueline Bonilla MRN: [id number] Date of Birth: [birth year]

## 2021-01-16 ENCOUNTER — Encounter: Payer: Self-pay | Admitting: Occupational Therapy

## 2021-01-16 ENCOUNTER — Other Ambulatory Visit: Payer: Self-pay

## 2021-01-16 ENCOUNTER — Ambulatory Visit: Payer: Medicare HMO | Admitting: Occupational Therapy

## 2021-01-16 ENCOUNTER — Encounter: Payer: Self-pay | Admitting: Speech Pathology

## 2021-01-16 ENCOUNTER — Ambulatory Visit: Payer: Medicare HMO | Admitting: Speech Pathology

## 2021-01-16 ENCOUNTER — Encounter: Payer: Self-pay | Admitting: Emergency Medicine

## 2021-01-16 DIAGNOSIS — R41841 Cognitive communication deficit: Secondary | ICD-10-CM

## 2021-01-16 DIAGNOSIS — R41844 Frontal lobe and executive function deficit: Secondary | ICD-10-CM

## 2021-01-16 DIAGNOSIS — I63512 Cerebral infarction due to unspecified occlusion or stenosis of left middle cerebral artery: Secondary | ICD-10-CM | POA: Diagnosis not present

## 2021-01-16 DIAGNOSIS — M6281 Muscle weakness (generalized): Secondary | ICD-10-CM | POA: Diagnosis not present

## 2021-01-16 DIAGNOSIS — I6300 Cerebral infarction due to thrombosis of unspecified precerebral artery: Secondary | ICD-10-CM | POA: Diagnosis not present

## 2021-01-16 DIAGNOSIS — R278 Other lack of coordination: Secondary | ICD-10-CM

## 2021-01-16 DIAGNOSIS — R41842 Visuospatial deficit: Secondary | ICD-10-CM | POA: Diagnosis not present

## 2021-01-16 DIAGNOSIS — R2689 Other abnormalities of gait and mobility: Secondary | ICD-10-CM

## 2021-01-16 DIAGNOSIS — R262 Difficulty in walking, not elsewhere classified: Secondary | ICD-10-CM | POA: Diagnosis not present

## 2021-01-17 NOTE — Therapy (Signed)
[place]. [place], [name], [address] Phone: [phone]   Fax:  [phone]  Occupational Therapy Treatment  Patient Details  Name: Jacqueline Bonilla MRN: [id number] Date of Birth: 15-Oct-1955 Referring Provider (OT): Lauraine Rinne, PA-C   Encounter Date: 01/16/2021   OT End of Session - 01/16/21 0934     Visit Number 9    Number of Visits 17    Date for OT Re-Evaluation 03/20/21    Authorization Type [employer]    Authorization - Visit Number 9    Authorization - Number of Visits 17    Progress Note Due on Visit 10    OT Start Time 0932    OT Stop Time 1012    OT Time Calculation (min) 40 min    Activity Tolerance Patient tolerated treatment well    Behavior During Therapy [facility] for tasks assessed/performed            Past Medical History:  Diagnosis Date   Anemia    Anxiety    Depression    Dysmenorrhea    Endometriosis    Fibroid    Hypertension    Microhematuria    negative workup   Osteoporosis    Tachycardia    Thrombocytopenia ([place])     Past Surgical History:  Procedure Laterality Date   BREAST BIOPSY     BUBBLE STUDY  12/08/2020   Procedure: BUBBLE STUDY;  Surgeon: Josue Hector, MD;  Location: [place];  Service: Cardiovascular;;   CESAREAN SECTION     hysteroscopic resection     implantable loop recorder implant  10/21/2019   Medtronic Reveal Linq model LNQ 22 ([place] [id number]) implantable loop recorder   IR CT HEAD LTD  12/01/2020   IR PERCUTANEOUS ART THROMBECTOMY/INFUSION INTRACRANIAL INC DIAG ANGIO  12/01/2020   IR US GUIDE VASC ACCESS RIGHT  12/01/2020   RADIOLOGY WITH ANESTHESIA N/A 12/01/2020   Procedure: IR WITH ANESTHESIA - CODE STROKE;  Surgeon: Radiologist, Medication, MD;  Location: [place];  Service: Radiology;  Laterality: N/A;   TEE WITHOUT CARDIOVERSION N/A 12/08/2020   Procedure: TRANSESOPHAGEAL ECHOCARDIOGRAM (TEE);  Surgeon: Josue Hector, MD;  Location:  [facility] ENDOSCOPY;  Service: Cardiovascular;  Laterality: N/A;    There were no vitals filed for this visit.   Subjective Assessment - 01/16/21 0933     Subjective  Pt reports she is using a hair dryer on a stand now instead of her usual hair dryer because her husband got it for her    Pertinent History Cerebral infarction 12/01/20; MRI indicated small acute infarcts in L MCA distribution and SAH overlying bilateral cerebral hemispheres. PMH includes 2 prior strokes in June 2021 (R-sided weakness) and June 2022 (L-sided weakness); vascular dementia; HTN; loop recorder implant 10/2019; osteoporosis; thrombocytopenia; anxiety; anemia    Patient Stated Goals "Anything I can to get better"    Currently in Pain? Yes    Pain Score 2     Pain Location Hand    Pain Orientation Right    Pain Descriptors / Indicators Dull    Pain Type Acute pain    Pain Onset 1 to 4 weeks ago    Pain Frequency Intermittent    Pain Relieving Factors Reports pain is slowly improving             Treatment/Exercises - 01/16/21    ADLs Reviewed BADL participation w/ pt; reports she is able to don/doff tops and bottoms w/ Mod  I and still experiences some difficulty w/ clothing fasteners, but that this has improved w/ practice. Pt reports the most difficulty w/ her hair - she is currently using a hair dryer set up on a stand and a straightener, but is having difficulty managing the hair dryer successfully. OT requested pt to bring in implements used during hair care next session    'Clock Yourself' Activity Tapping number called out on 'Clock Yourself' app holding therapy cones in each hand; pt instructed to tap targets on L and R side w/ contralateral UE. Completed activity in standing w/ numbers positioned on the wall following the pattern of a traditional analog clock. Activity facilitated dual tasking (physical and cognitive skills), alternating attention, standing balance, visual scanning, targeting, and GMC/coordination.  1st set completed at pace of 30 targets/minute, decreased to 20 targets/minute in 2nd set to facilitate increased accuracy    Visual Perceptual Activity Picking up small weighted ball (4.4 lbs) w/ BUEs from moving treadmill (0.5 MPH) and lightly tossing it back up treadmill and waiting for it to return back toward end of treadmill; completed 15x. OT provided verbal cues to increase incorporation of RUE and pt completed additional set of 10 reps w/ RUE only    RUE Functional Use OT reviewed condition-specific education related to NMR and motor learning; encouraging pt to incorporate RUE into functional activities (safely) as much as possible. To demonstrate point, problem-solved options to carry/lift/drink from coffee mug w/ pt returning demonstration w/out difficulty            OT Short Term Goals - 01/06/21 1007       OT SHORT TERM GOAL #1   Title Pt will improve participation in manipulation of clothing fasteners as evidenced by completing 9-Hole Peg Test w/ less than 5 drops using R, dominant hand    Baseline R hand 2 min, 20 sec w/ 7 drops    Time 4    Period Weeks    Status Achieved   01/06/21 - 3 drops (1 min, 23 sec)   Target Date 01/17/21      OT SHORT TERM GOAL #2   Title Pt will improve safety w/ functional overhead reach by increasing strength of R shoulder flexion to at least 4-/5    Baseline 3+/5 (full ROM against gravity; slight resistance)    Time 4    Period Weeks    Status On-going    Target Date 01/17/21      OT SHORT TERM GOAL #3   Title Pt will demonstrate ability to thread first sleeve into jacket to improve initiation of task and independence w/ BADLs    Baseline Min A to initiate task    Time 4    Period Weeks    Status Achieved   01/05/20 - Mod I w/ donning jacket; assist only w/ threading zipper   Target Date 01/17/21             OT Long Term Goals - 01/06/21 1011       OT LONG TERM GOAL #1   Title Pt will demonstrate independence w/ full HEP  designed for BUE strengthening, GM/FMC, and coordination by discharge    Baseline No HEP at this time    Time 8    Period Weeks    Status On-going    Target Date 02/14/21      OT LONG TERM GOAL #2   Title Pt will improve RUE GMC and coordination for improved participation in ADLs as  evidenced by increasing Box and Blocks score by at least 5 blocks    Baseline 01/06/21 - 20 blocks    Time 8    Period Weeks    Status On-going    Target Date 02/14/21      OT LONG TERM GOAL #3   Title Pt will be able to thread zipper w/ Mod I, using AE prn, in at least 1 attempt    Baseline Unable to thread zipper    Time 8    Period Weeks    Status On-going    Target Date 02/14/21      OT LONG TERM GOAL #4   Title Pt will be able to complete simulated self-feeding w/ Mod I (scooping, bringing utensil to mouth, cutting food, etc.) using AE prn    Baseline Reports difficulty manipulating utensils    Time 8    Period Weeks    Status On-going    Target Date 02/14/21      OT LONG TERM GOAL #5   Title Pt will be able to don socks w/ Mod I, using AE prn, in at least 1 attempt by d/c    Baseline Needs assist w/ donning socks    Time 8    Period Weeks    Status On-going    Target Date 02/14/21             Plan - 01/16/21 0936     Clinical Impression Statement Considering pt's report of improved participation and independence w/ BADLs (pt reports she got herself ready for church yesterday), OT dicussed participation in functional activities to identify tasks that continued to be challenging for pt. Activities involving bilateral coordination appear to be most difficulty. Due to this, OT continued to facilitate bilateral integration and crossing midline, while also addressing visual perceptual and visual scanning skills and dynamic balance. Pt continues to benefit from cues to facilitate understanding and consistency w/ novel tasks, but success typically improves notably w/ repetition/increased sets.     OT Occupational Profile and History Detailed Assessment- Review of Records and additional review of physical, cognitive, psychosocial history related to current functional performance    Occupational performance deficits (Please refer to evaluation for details): ADL's;IADL's;Rest and Sleep;Leisure;Social Participation    Body Structure / Function / Physical Skills ADL;Decreased knowledge of use of DME;Gait;Strength;Balance;Dexterity;GMC;Tone;Body mechanics;Proprioception;UE functional use;IADL;Endurance;ROM;Vision;Coordination;Mobility;Sensation;FMC    Cognitive Skills Memory;Problem Solve;Safety Awareness;Sequencing    Rehab Potential Fair   Due to comorbidities   Clinical Decision Making Several treatment options, min-mod task modification necessary    Comorbidities Affecting Occupational Performance: Presence of comorbidities impacting occupational performance    Comorbidities impacting occupational performance description: Vascular dementia; anxiety    Modification or Assistance to Complete Evaluation  Min-Moderate modification of tasks or assist with assess necessary to complete eval    OT Frequency 2x / week    OT Duration 8 weeks    OT Treatment/Interventions Self-care/ADL training;Aquatic Therapy;Electrical Stimulation;Moist Heat;Cryotherapy;Therapeutic exercise;Neuromuscular education;Energy conservation;Manual Therapy;Functional Mobility Training;DME and/or AE instruction;Passive range of motion;Patient/family education;Visual/perceptual remediation/compensation;Cognitive remediation/compensation;Therapeutic activities;Splinting;Balance training    Plan Practice grooming tasks (pt requested to bring in hair dryer/straightener)   OT Home Exercise Plan MedBridge Code: [id number] - shoulder flex/ext w/ theraband; weight shift L/R in quadruped; scapular retraction on forearms in prone   Recommended Other Services Currently receiving ST and PT services at this location    Consulted and Agree  with Plan of Care Patient            Patient will  benefit from skilled therapeutic intervention in order to improve the following deficits and impairments:   Body Structure / Function / Physical Skills: ADL, Decreased knowledge of use of DME, Gait, Strength, Balance, Dexterity, GMC, Tone, Body mechanics, Proprioception, UE functional use, IADL, Endurance, ROM, Vision, Coordination, Mobility, Sensation, [facility] Cognitive Skills: Memory, Problem Solve, Safety Awareness, Sequencing   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 [name])  Other lack of coordination  Visuospatial deficit  Other abnormalities of gait and mobility  Muscle weakness (generalized)  Frontal lobe and executive function deficit   Problem List Patient Active Problem List   Diagnosis Date Noted   Screening for malignant neoplasm of colon 01/10/2021   Change in bowel habit 01/10/2021   Flatulence, eructation and gas pain 01/10/2021   Internal hemorrhoids 01/10/2021   Ingrowing nail, left great toe 01/10/2021   Dyslipidemia 12/29/2020   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 [place], [name], [address] Phone: [phone]   Fax:  [phone]  Name: Jacqueline Bonilla MRN: [id number] Date of Birth: 02-03-1955

## 2021-01-17 NOTE — Therapy (Signed)
[name], [name], [address] Phone: [phone]   Fax:  [phone]  Speech Language Pathology Treatment  Patient Details  Name: Jacqueline Bonilla MRN: [id number] Date of Birth: 04-22-1955 Referring Provider (SLP): Dawayne Patricia   Encounter Date: 01/16/2021   End of Session - 01/16/21 1019     Visit Number 9    Number of Visits 17    Date for SLP Re-Evaluation 02/20/21    SLP Start Time 9191    SLP Stop Time  1055    SLP Time Calculation (min) 40 min    Activity Tolerance Patient tolerated treatment well             Past Medical History:  Diagnosis Date   Anemia    Anxiety    Depression    Dysmenorrhea    Endometriosis    Fibroid    Hypertension    Microhematuria    negative workup   Osteoporosis    Tachycardia    Thrombocytopenia ([place])     Past Surgical History:  Procedure Laterality Date   BREAST BIOPSY     BUBBLE STUDY  12/08/2020   Procedure: BUBBLE STUDY;  Surgeon: Josue Hector, MD;  Location: [place];  Service: Cardiovascular;;   CESAREAN SECTION     hysteroscopic resection     implantable loop recorder implant  10/21/2019   Medtronic Reveal Linq model LNQ 22 ([place] [id number]) implantable loop recorder   IR CT HEAD LTD  12/01/2020   IR PERCUTANEOUS ART THROMBECTOMY/INFUSION INTRACRANIAL INC DIAG ANGIO  12/01/2020   IR US GUIDE VASC ACCESS RIGHT  12/01/2020   RADIOLOGY WITH ANESTHESIA N/A 12/01/2020   Procedure: IR WITH ANESTHESIA - CODE STROKE;  Surgeon: Radiologist, Medication, MD;  Location: [place];  Service: Radiology;  Laterality: N/A;   TEE WITHOUT CARDIOVERSION N/A 12/08/2020   Procedure: TRANSESOPHAGEAL ECHOCARDIOGRAM (TEE);  Surgeon: Josue Hector, MD;  Location: [facility] ENDOSCOPY;  Service: Cardiovascular;  Laterality: N/A;    There were no vitals filed for this visit.   Subjective Assessment - 01/16/21 1017     Subjective "What I am hearing, isn't always  what [name] is saying - so I have been asking for repetition."    Currently in Pain? Yes    Pain Score 1     Pain Location Hand    Pain Orientation Right    Pain Type Acute pain    Pain Onset 1 to 4 weeks ago                   ADULT SLP TREATMENT - 01/17/21 1439       General Information   Behavior/Cognition Alert;Cooperative;Pleasant mood      Treatment Provided   Treatment provided Cognitive-Linquistic      Cognitive-Linquistic Treatment   Treatment focused on Cognition    Skilled Treatment SLP facilitated use of memory strategies through therapeutic task. Pt was asked to use visualization to recall order of information. SLP instructed pt to create a visual story when given 3 items of information in a conversation. For example, "taking out the trash", "going to get the mail," "going to the grocery store". Pt made a visual journey through each of those items and was able to recall all 3 items 5+ minutes later. Pt reported visualization was helpful in processing and recalling items. Pt was also able to provide 3 areas of challenge for her when asked, which is helping increase awareness  of impairments.      Assessment / Recommendations / Plan   Plan Continue with current plan of care      Progression Toward Goals   Progression toward goals Progressing toward goals                SLP Short Term Goals - 01/17/21 1446       SLP SHORT TERM GOAL #1   Title Pt will provide 3 areas of challenge for her given min verbal cues to demonstrate increased awareness.    Time 4    Period Weeks    Status Achieved    Target Date 01/17/21      SLP SHORT TERM GOAL #2   Title Pt will recall 3 memory strategies to aid recall of important information with minA.    Time 4    Period Weeks    Status Achieved    Target Date 01/17/21      SLP SHORT TERM GOAL #3   Title Pt will identify errors during structured cognitive task with modA verbal cues.    Time 4    Period Weeks     Status Not Met    Target Date 01/17/21      SLP SHORT TERM GOAL #4   Title Pt will recall 3 word finding strategies to decrease anomia in conversation given minA.    Time 4    Period Weeks    Status Not Met    Target Date 01/17/21              SLP Long Term Goals - 12/20/20 1711       SLP LONG TERM GOAL #1   Title Pt will provide 3 areas of challenge for her independently to demonstrate increased awareness of deficits.    Time 8    Period Weeks    Status New    Target Date 02/14/21      SLP LONG TERM GOAL #2   Title Patient will demonstrate use of memory strategies to schedule activities, recall weekly events and items to maintain safety to participate socially in functional living environment    Time 8    Period Weeks    Status New    Target Date 02/14/21      SLP LONG TERM GOAL #3   Title Pt will identify errors during structured cognitive task with minA verbal cues.    Time 8    Period Weeks    Status New    Target Date 02/14/21      SLP LONG TERM GOAL #4   Title Pt will demonstrate 2-3 word finding strategies to decrease anomia in conversation independently.    Time 8    Period Weeks    Status New    Target Date 02/14/21              Plan - 01/17/21 1445     Clinical Impression Statement See tx note. Pt reported she felt the memory strategies have been helping at home. SLP rec skilled ST services to provide assistance with memory and attention strategies to increase independence and efficiency at home.    Speech Therapy Frequency 2x / week    Duration 8 weeks    Treatment/Interventions Compensatory strategies;Cueing hierarchy;Functional tasks;Patient/family education;Environmental controls;Cognitive reorganization;Multimodal communcation approach;Language facilitation;Compensatory techniques;Internal/external aids;SLP instruction and feedback    Potential to Achieve Goals Fair    Potential Considerations Ability to learn/carryover information     Consulted and Agree with Plan of Care  Patient             Patient will benefit from skilled therapeutic intervention in order to improve the following deficits and impairments:   Cognitive communication deficit    Problem List Patient Active Problem List   Diagnosis Date Noted   Screening for malignant neoplasm of colon 01/10/2021   Change in bowel habit 01/10/2021   Flatulence, eructation and gas pain 01/10/2021   Internal hemorrhoids 01/10/2021   Ingrowing nail, left great toe 01/10/2021   Dyslipidemia 12/29/2020   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 [place], [name], [address] Phone: [phone]   Fax:  [phone]   Name: Jacqueline Bonilla MRN: [id number] Date of Birth: June 26, 1955

## 2021-01-18 ENCOUNTER — Other Ambulatory Visit: Payer: Self-pay

## 2021-01-18 ENCOUNTER — Ambulatory Visit: Payer: Medicare HMO | Admitting: Occupational Therapy

## 2021-01-18 ENCOUNTER — Ambulatory Visit: Payer: Medicare HMO | Admitting: Speech Pathology

## 2021-01-18 ENCOUNTER — Ambulatory Visit: Payer: Medicare HMO | Admitting: Physical Therapy

## 2021-01-18 ENCOUNTER — Encounter: Payer: Self-pay | Admitting: Physical Therapy

## 2021-01-18 ENCOUNTER — Encounter: Payer: Self-pay | Admitting: Speech Pathology

## 2021-01-18 ENCOUNTER — Encounter: Payer: Self-pay | Admitting: Occupational Therapy

## 2021-01-18 DIAGNOSIS — I63512 Cerebral infarction due to unspecified occlusion or stenosis of left middle cerebral artery: Secondary | ICD-10-CM

## 2021-01-18 DIAGNOSIS — R41844 Frontal lobe and executive function deficit: Secondary | ICD-10-CM | POA: Diagnosis not present

## 2021-01-18 DIAGNOSIS — R41841 Cognitive communication deficit: Secondary | ICD-10-CM

## 2021-01-18 DIAGNOSIS — R262 Difficulty in walking, not elsewhere classified: Secondary | ICD-10-CM

## 2021-01-18 DIAGNOSIS — R41842 Visuospatial deficit: Secondary | ICD-10-CM

## 2021-01-18 DIAGNOSIS — M6281 Muscle weakness (generalized): Secondary | ICD-10-CM

## 2021-01-18 DIAGNOSIS — I6300 Cerebral infarction due to thrombosis of unspecified precerebral artery: Secondary | ICD-10-CM | POA: Diagnosis not present

## 2021-01-18 DIAGNOSIS — R278 Other lack of coordination: Secondary | ICD-10-CM

## 2021-01-18 DIAGNOSIS — R2689 Other abnormalities of gait and mobility: Secondary | ICD-10-CM

## 2021-01-18 NOTE — Therapy (Signed)
[address]. [place], [name], [address] Phone: [phone]   Fax:  [phone]  Occupational Therapy Treatment & Progress Note  Patient Details  Name: Jacqueline Bonilla MRN: [id number] Date of Birth: [birth year] Referring Provider (OT): Lauraine Rinne, PA-C   Encounter Date: 01/18/2021   OT End of Session - 01/18/21 1019     Visit Number 10    Number of Visits 17    Date for OT Re-Evaluation 03/20/21    Authorization Type [employer]    Authorization - Visit Number 10    Authorization - Number of Visits 17    Progress Note Due on Visit 10    OT Start Time 1016    OT Stop Time 1058    OT Time Calculation (min) 42 min    Activity Tolerance Patient tolerated treatment well    Behavior During Therapy [facility] for tasks assessed/performed            Occupational Therapy Progress Note  Dates of Reporting Period: 12/19/20 to 01/18/21  Objective Reports of Subjective Statement: Pt reports continued pain in her R hand and R knee from her fall 1.5 weeks ago; mild abrasion observable at base of 5th MCPJ on palmar side of hand  Objective Measurements: MMT, 9-HPT, grip/pinch strength w/ dynamometer and pinch gauge, Box and Blocks Test, levels of independence  Goal Update: Pt has met 3/3 STGs and 2/6 LTGs, working toward remaining 4 LTGs  Plan: Continue to address RUE and R hand strength and coordination, visual perception, and bilateral coordination, as well as functional problem-solving and independence with functional activities  Reason Skilled Services are Required: Pt continues to experience limited strength, control, and coordination on her R side. Participation in functional activities is also impacted by this as well as by limitations w/ executive functioning, visual perception, and bilateral coordination.   Past Medical History:  Diagnosis Date   Anemia    Anxiety    Depression    Dysmenorrhea     Endometriosis    Fibroid    Hypertension    Microhematuria    negative workup   Osteoporosis    Tachycardia    Thrombocytopenia ([place])     Past Surgical History:  Procedure Laterality Date   BREAST BIOPSY     BUBBLE STUDY  12/08/2020   Procedure: BUBBLE STUDY;  Surgeon: Josue Hector, MD;  Location: [facility] ENDOSCOPY;  Service: Cardiovascular;;   CESAREAN SECTION     hysteroscopic resection     implantable loop recorder implant  10/21/2019   Medtronic Reveal Linq model LNQ 22 ([place] [id number]) implantable loop recorder   IR CT HEAD LTD  12/01/2020   IR PERCUTANEOUS ART THROMBECTOMY/INFUSION INTRACRANIAL INC DIAG ANGIO  12/01/2020   IR US GUIDE VASC ACCESS RIGHT  12/01/2020   RADIOLOGY WITH ANESTHESIA N/A 12/01/2020   Procedure: IR WITH ANESTHESIA - CODE STROKE;  Surgeon: Radiologist, Medication, MD;  Location: [place];  Service: Radiology;  Laterality: N/A;   TEE WITHOUT CARDIOVERSION N/A 12/08/2020   Procedure: TRANSESOPHAGEAL ECHOCARDIOGRAM (TEE);  Surgeon: Josue Hector, MD;  Location: [facility] ENDOSCOPY;  Service: Cardiovascular;  Laterality: N/A;    There were no vitals filed for this visit.   Subjective Assessment - 01/18/21 1017     Subjective  Pt reports she has returned to using her old hair dryer, but is still having trouble problem-solving how to use it on her own    Pertinent History Cerebral infarction 12/01/20; MRI  indicated small acute infarcts in L MCA distribution and SAH overlying bilateral cerebral hemispheres. PMH includes 2 prior strokes in June 2021 (R-sided weakness) and June 2022 (L-sided weakness); vascular dementia; HTN; loop recorder implant 10/2019; osteoporosis; thrombocytopenia; anxiety; anemia    Patient Stated Goals "Anything I can to get better"    Currently in Pain? Yes    Pain Score 1     Pain Location Hand   R knee as well   Pain Orientation Right    Pain Descriptors / Indicators Dull;Sore    Pain Type Acute pain    Pain Onset 1 to 4 weeks ago    Pain  Frequency Intermittent    Pain Relieving Factors Reports pain in her R hand and R knee continue to improve             [facility] OT Assessment - 01/18/21 1233       Hand Function   Right Hand Grip (lbs) 24 lbs   Right Hand Lateral Pinch 7 lbs   Right Hand 3 Point Pinch 12 lbs   Left Hand Grip (lbs) 35 lbs   Left Hand Lateral Pinch 9 lbs   Left 3 point pinch 10 lbs            Treatment/Exercises Assessment - 01/18/21    ADLs  Reviewed BADL participation w/ pt; demonstrated doffing/donning and threading zipper of zip-up jacket w/ Mod I (extended time) and doffing/donning socks independently. Able to self-feed using spoon and fork w/ built-up handles w/out difficulty, but continues to report difficulty cutting food and w/ hair care. OT again encouraged pt to bring in her hair dryer, providing education on practicing problem-solving w/ OT to improve participation and success w/ activity    Putty Exercises Full gross grasp of putty (red, med-soft) w/ R hand; OT provided occasional facilitation/multimodal cueing to incorporate entire hand vs only first 3 fingers  Lateral and 3-point pinch against resistance putty (red, med-soft) w/ R hand; OT provided modeling and mod multimodal cues (verbal/tactile) for understanding of positioning            OT Short Term Goals - 01/18/21 1233       OT SHORT TERM GOAL #1   Title Pt will improve participation in manipulation of clothing fasteners as evidenced by completing 9-Hole Peg Test w/ less than 5 drops using R, dominant hand    Baseline R hand 2 min, 20 sec w/ 7 drops    Time 4    Period Weeks    Status Achieved   01/06/21 - 3 drops (1 min, 23 sec)   Target Date 01/17/21      OT SHORT TERM GOAL #2   Title Pt will improve safety w/ functional overhead reach by increasing strength of R shoulder flexion to at least 4-/5    Baseline 3+/5 (full ROM against gravity; slight resistance)    Time 4    Period Weeks    Status Achieved   01/18/21 -  4-/5   Target Date 01/17/21      OT SHORT TERM GOAL #3   Title Pt will demonstrate ability to thread first sleeve into jacket to improve initiation of task and independence w/ BADLs    Baseline Min A to initiate task    Time 4    Period Weeks    Status Achieved   01/05/20 - Mod I w/ donning jacket; assist only w/ threading zipper   Target Date 01/17/21  OT Long Term Goals - 01/18/21 1049       OT LONG TERM GOAL #1   Title Pt will demonstrate independence w/ full HEP designed for BUE strengthening, GM/FMC, and coordination by discharge    Baseline No HEP at this time    Time 8    Period Weeks    Status On-going    Target Date 02/14/21      OT LONG TERM GOAL #2   Title Pt will improve RUE Long and coordination for improved participation in ADLs as evidenced by increasing Box and Blocks score by at least 5 blocks    Baseline 01/06/21 - 20 blocks    Time 8    Period Weeks    Status On-going   01/18/21 - 24 blocks   Target Date 02/14/21      OT LONG TERM GOAL #3   Title Pt will be able to thread zipper w/ Mod I, using AE prn, in at least 1 attempt    Baseline Unable to thread zipper    Time 8    Period Weeks    Status Achieved   01/18/21 - Mod I (extended time; no AE)   Target Date 02/14/21      OT LONG TERM GOAL #4   Title Pt will be able to complete simulated self-feeding w/ Mod I (scooping, bringing utensil to mouth, cutting food, etc.) using AE prn    Baseline Reports difficulty manipulating utensils    Time 8    Period Weeks    Status On-going   01/18/21 - reports continued difficulty only w/ cutting tough foods   Target Date 02/14/21      OT LONG TERM GOAL #5   Title Pt will be able to don socks w/ Mod I, using AE prn, in at least 1 attempt by d/c    Baseline Needs assist w/ donning socks    Time 8    Period Weeks    Status Achieved   01/18/21 - demonstrated w/ Mod I   Target Date 02/14/21      OT LONG TERM GOAL #6   Title Pt will improve R hand grip  strength by at least 5 lbs to improve safety w/ IADLs and decrease object drops/spills    Baseline 01/18/21 - RUE 24 lbs (dominant side); LUE 35#    Time 8    Period Weeks    Status New    Target Date 02/14/21             Plan - 01/18/21 1237     Clinical Impression Statement Pt is progressing well toward goals w/ OT reassessing strength, GM coordination, and [place] this session. Pt demonstrated elbow strength WFL compared to LUE as well as improvements in shoulder flexion and extension, though shoulder flexion and abduction continue to be mildly limited. Box and Blocks score also improved, which indicates improved unilateral [place] and coordination. Progress toward functional goals also observed w/ pt able to complete dressing and self-feeding tasks w/ less cues and less attempts before achieving success compared to prior attempts. OT then continued to facilitate both gross and intrinsic hand strengthening due to objective measurements indicating decreased strength of R hand compared to L hand. Putty exercises completed included in pt's HEP and were also incorporated into treatment for additional benefit of NMR of dexterity and [facility] as well as increased somatosensory input.    OT Occupational Profile and History Detailed Assessment- Review of Records and additional review of physical,  cognitive, psychosocial history related to current functional performance    Occupational performance deficits (Please refer to evaluation for details): ADL's;IADL's;Rest and Sleep;Leisure;Social Participation    Body Structure / Function / Physical Skills ADL;Decreased knowledge of use of DME;Gait;Strength;Balance;Dexterity;GMC;Tone;Body mechanics;Proprioception;UE functional use;IADL;Endurance;ROM;Vision;Coordination;Mobility;Sensation;FMC    Cognitive Skills Memory;Problem Solve;Safety Awareness;Sequencing    Rehab Potential Fair   Due to comorbidities   Clinical Decision Making Several treatment options, min-mod task  modification necessary    Comorbidities Affecting Occupational Performance: Presence of comorbidities impacting occupational performance    Comorbidities impacting occupational performance description: Vascular dementia; anxiety    Modification or Assistance to Complete Evaluation  Min-Moderate modification of tasks or assist with assess necessary to complete eval    OT Frequency 2x / week    OT Duration 8 weeks    OT Treatment/Interventions Self-care/ADL training;Aquatic Therapy;Electrical Stimulation;Moist Heat;Cryotherapy;Therapeutic exercise;Neuromuscular education;Energy conservation;Manual Therapy;Functional Mobility Training;DME and/or AE instruction;Passive range of motion;Patient/family education;Visual/perceptual remediation/compensation;Cognitive remediation/compensation;Therapeutic activities;Splinting;Balance training    Plan Practice grooming tasks (pt requested to bring in hair dryer/straightener) and/or simulated cutting of food using AE prn; asymmetrical bilateral coordination; multi-step sequencing   OT Home Exercise Plan MedBridge Code: [id number] - shoulder flex/ext w/ theraband; weight shift L/R in quadruped; scapular retraction on forearms in prone; gross grasp w/ putty; 3-pt and lateral pinch w/ putty    Recommended Other Services Currently receiving ST and PT services at this location    Consulted and Agree with Plan of Care Patient            Patient will benefit from skilled therapeutic intervention in order to improve the following deficits and impairments:   Body Structure / Function / Physical Skills: ADL, Decreased knowledge of use of DME, Gait, Strength, Balance, Dexterity, GMC, Tone, Body mechanics, Proprioception, UE functional use, IADL, Endurance, ROM, Vision, Coordination, Mobility, Sensation, [facility] Cognitive Skills: Memory, Problem Solve, Safety Awareness, Sequencing   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 [facility])  Other lack of  coordination  Visuospatial deficit  Muscle weakness (generalized)  Frontal lobe and executive function deficit   m List Patient Active Problem List   Diagnosis Date Noted   Screening for malignant neoplasm of colon 01/10/2021   Change in bowel habit 01/10/2021   Flatulence, eructation and gas pain 01/10/2021   Internal hemorrhoids 01/10/2021   Ingrowing nail, left great toe 01/10/2021   Dyslipidemia 12/29/2020   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 [place], [name], [address] Phone: [phone]   Fax:  [phone]  Name: HARLOWE DOWLER MRN: [id number] Date of Birth: 1955-08-21

## 2021-01-18 NOTE — Therapy (Signed)
[address]. [place], [name], [address] Phone: [phone]   Fax:  [phone]  Physical Therapy Treatment  Patient Details  Name: Jacqueline Bonilla MRN: [id number] Date of Birth: Aug 22, 1955 Referring Provider (PT): Sagardia   Encounter Date: 01/18/2021   PT End of Session - 01/18/21 1206     Visit Number 8    Number of Visits 12    Date for PT Re-Evaluation 02/03/21    PT Start Time 1103    PT Stop Time 1142    PT Time Calculation (min) 39 min    Activity Tolerance Patient tolerated treatment well    Behavior During Therapy [facility] for tasks assessed/performed             Past Medical History:  Diagnosis Date   Anemia    Anxiety    Depression    Dysmenorrhea    Endometriosis    Fibroid    Hypertension    Microhematuria    negative workup   Osteoporosis    Tachycardia    Thrombocytopenia ([place])     Past Surgical History:  Procedure Laterality Date   BREAST BIOPSY     BUBBLE STUDY  12/08/2020   Procedure: BUBBLE STUDY;  Surgeon: Josue Hector, MD;  Location: [place];  Service: Cardiovascular;;   CESAREAN SECTION     hysteroscopic resection     implantable loop recorder implant  10/21/2019   Medtronic Reveal Linq model LNQ 22 ([place] [id number]) implantable loop recorder   IR CT HEAD LTD  12/01/2020   IR PERCUTANEOUS ART THROMBECTOMY/INFUSION INTRACRANIAL INC DIAG ANGIO  12/01/2020   IR US GUIDE VASC ACCESS RIGHT  12/01/2020   RADIOLOGY WITH ANESTHESIA N/A 12/01/2020   Procedure: IR WITH ANESTHESIA - CODE STROKE;  Surgeon: Radiologist, Medication, MD;  Location: [place];  Service: Radiology;  Laterality: N/A;   TEE WITHOUT CARDIOVERSION N/A 12/08/2020   Procedure: TRANSESOPHAGEAL ECHOCARDIOGRAM (TEE);  Surgeon: Josue Hector, MD;  Location: [facility] ENDOSCOPY;  Service: Cardiovascular;  Laterality: N/A;    There were no vitals filed for this visit.   Subjective Assessment - 01/18/21 1104      Subjective I'm doing OK, nail still hurts. [facility]parking zone and I tripped on the curb. Still wearing open toe shoe on the left that feels like its making more unsteady. No other falls or close calls. I had a hard time getting up, I had to roll over and people did help me get up.    Pertinent History prior CVA affecting R side, HTN    Patient Stated Goals be more mobile, improve balance,  increase independence    Currently in Pain? Yes    Pain Score 1     Pain Location --   toe and hand                              [facility] Adult PT Treatment/Exercise - 01/18/21 0001       Knee/Hip Exercises: Seated   Long Arc Quad Both;1 set;10 reps    Long Arc Quad Limitations red TB    Clamshell with TheraBand Red   1x15   Hamstring Curl Both;1 set;10 reps    Hamstring Limitations red TB      Knee/Hip Exercises: Supine   Bridges Both;1 set;15 reps    Bridges Limitations controlled lower  Knee/Hip Exercises: Sidelying   Hip ABduction Both;1 set;10 reps    Hip ABduction Limitations mod facilitation for good form                 Balance Exercises - 01/18/21 0001       Balance Exercises: Standing   Tandem Stance Eyes open;3 reps;30 secs    Gait with Head Turns Forward;4 reps   horizontal and vertical head turns   Retro Gait 4 reps    Sidestepping Foam/compliant support;3 reps   in // bars               PT Education - 01/18/21 1156     Education Details exercise form/purpose lots of multimodal cuing for form and safety    Person(s) Educated Patient    Methods Explanation    Comprehension Verbalized understanding              PT Short Term Goals - 01/11/21 1145       PT SHORT TERM GOAL #1   Title Pt will be I with initial HEP    Time 1    Period Weeks    Status On-going    Target Date 01/18/21               PT Long Term Goals - 01/09/21 0942       PT LONG TERM GOAL #4   Title Pt will report able  to safely return to gym routine at [facility]    Time [place] - 01/18/21 [place] arrives today doing OK, pretty fatigued since she had both OT and speech first today. Still wearing open toed shoe due to toenail procedure, seemed a bit more unsteady today than typical maybe due to having two different types of shoes. Spent some time working on balance today, did note some significant difficulty with multitasking activities. Also worked on lower level functional strength as tolerated. Will continue efforts.    Personal Factors and Comorbidities Comorbidity 2    Comorbidities hx of CVA, HTN    Examination-Activity Limitations Stand;Locomotion Level    Examination-Participation Restrictions Community Activity;Interpersonal Relationship    Stability/Clinical Decision Making Evolving/Moderate complexity    Clinical Decision Making Moderate    Rehab Potential Good    PT Frequency 2x / week    PT Duration Other (comment)    PT Treatment/Interventions ADLs/Self Care Home Management;Gait training;Stair training;Functional mobility training;Therapeutic activities;Therapeutic exercise;Balance training;Neuromuscular re-education;Manual techniques;Patient/family education    PT Next Visit Plan work on balance, functional gait, safety, strength    PT Home Exercise Plan Access Code: [email or web address]    Consulted and Agree with Plan of Care Patient             Patient will benefit from skilled therapeutic intervention in order to improve the following deficits and impairments:  Difficulty walking, Abnormal gait, Impaired UE functional use, Decreased endurance, Cardiopulmonary status limiting activity, Decreased activity tolerance, Impaired vision/preception, Decreased balance, Postural dysfunction, Decreased strength, Decreased mobility  Visit Diagnosis: Balance problem  Difficulty in walking, not elsewhere  classified  Muscle weakness (generalized)     Problem List Patient Active Problem List   Diagnosis Date Noted   Screening for malignant neoplasm of colon 01/10/2021   Change in bowel habit 01/10/2021   Flatulence, eructation and gas  pain 01/10/2021   Internal hemorrhoids 01/10/2021   Ingrowing nail, left great toe 01/10/2021   Dyslipidemia 12/29/2020   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 [place], [name], [address] Phone: [phone]   Fax:  [phone]  Name: WETONA VIRAMONTES MRN: [id number] Date of Birth: 28-Apr-1955

## 2021-01-18 NOTE — Therapy (Signed)
[facility] [place], [name], [address] Phone: [phone]   Fax:  [phone]  Speech Language Pathology Treatment & Progress Note  Patient Details  Name: Jacqueline Bonilla MRN: [id number] Date of Birth: November 23, 1955 Referring Provider (SLP): Dawayne Patricia   Encounter Date: 01/18/2021   End of Session - 01/18/21 0937     Visit Number 10    Number of Visits 17    Date for SLP Re-Evaluation 02/20/21    SLP Start Time 0930    SLP Stop Time  1010    SLP Time Calculation (min) 40 min    Activity Tolerance Patient tolerated treatment well             Past Medical History:  Diagnosis Date   Anemia    Anxiety    Depression    Dysmenorrhea    Endometriosis    Fibroid    Hypertension    Microhematuria    negative workup   Osteoporosis    Tachycardia    Thrombocytopenia ([place])     Past Surgical History:  Procedure Laterality Date   BREAST BIOPSY     BUBBLE STUDY  12/08/2020   Procedure: BUBBLE STUDY;  Surgeon: Josue Hector, MD;  Location: [facility];  Service: Cardiovascular;;   CESAREAN SECTION     hysteroscopic resection     implantable loop recorder implant  10/21/2019   Medtronic Reveal Linq model LNQ 22 ([place] [id number]) implantable loop recorder   IR CT HEAD LTD  12/01/2020   IR PERCUTANEOUS ART THROMBECTOMY/INFUSION INTRACRANIAL INC DIAG ANGIO  12/01/2020   IR US GUIDE VASC ACCESS RIGHT  12/01/2020   RADIOLOGY WITH ANESTHESIA N/A 12/01/2020   Procedure: IR WITH ANESTHESIA - CODE STROKE;  Surgeon: Radiologist, Medication, MD;  Location: [facility];  Service: Radiology;  Laterality: N/A;   TEE WITHOUT CARDIOVERSION N/A 12/08/2020   Procedure: TRANSESOPHAGEAL ECHOCARDIOGRAM (TEE);  Surgeon: Josue Hector, MD;  Location: [facility] END[facility]COPY;  Service: Cardiovascular;  Laterality: N/A;    There were no vitals filed for this visit.   Subjective Assessment - 01/18/21 0936     Subjective "I had cereal  for breakfast."    Currently in Pain? No/denies    Pain Score 1     Pain Location Hand    Pain Orientation Right    Pain Type Acute pain    Pain Onset 1 to 4 weeks ago    Pain Frequency Intermittent                   ADULT SLP TREATMENT - 01/18/21 1422       General Information   Behavior/Cognition Alert;Cooperative;Pleasant mood      Treatment Provided   Treatment provided Cognitive-Linquistic      Cognitive-Linquistic Treatment   Treatment focused on Cognition    Skilled Treatment SLP provided pt with edu and handout covering communication strategies for partners to use to aid pt comprehension of information. Pt was able to share that she would benefit more from "extra time" from communication partners. SLP discussed how communication is a "two-way street" where it is important for both her and loved ones to alter their communication to fit the pt's current level of communication. Also provided handout on memory and how communication partners can assist in managing the decline in a helpful, postive way. *Handouts given in enlarged print to aid reading and facilitated comprehension.      Assessment / Recommendations / Plan  Plan Continue with current plan of care      Progression Toward Goals   Progression toward goals Progressing toward goals                SLP Short Term Goals - 01/17/21 1446       SLP SHORT TERM GOAL #1   Title Pt will provide 3 areas of challenge for her given min verbal cues to demonstrate increased awareness.    Time 4    Period Weeks    Status Achieved    Target Date 01/17/21      SLP SHORT TERM GOAL #2   Title Pt will recall 3 memory strategies to aid recall of important information with minA.    Time 4    Period Weeks    Status Achieved    Target Date 01/17/21      SLP SHORT TERM GOAL #3   Title Pt will identify errors during structured cognitive task with modA verbal cues.    Time 4    Period Weeks    Status Not Met     Target Date 01/17/21      SLP SHORT TERM GOAL #4   Title Pt will recall 3 word finding strategies to decrease anomia in conversation given minA.    Time 4    Period Weeks    Status Not Met    Target Date 01/17/21              SLP Long Term Goals - 12/20/20 1711       SLP LONG TERM GOAL #1   Title Pt will provide 3 areas of challenge for her independently to demonstrate increased awareness of deficits.    Time 8    Period Weeks    Status New    Target Date 02/14/21      SLP LONG TERM GOAL #2   Title Patient will demonstrate use of memory strategies to schedule activities, recall weekly events and items to maintain safety to participate socially in functional living environment    Time 8    Period Weeks    Status New    Target Date 02/14/21      SLP LONG TERM GOAL #3   Title Pt will identify errors during structured cognitive task with minA verbal cues.    Time 8    Period Weeks    Status New    Target Date 02/14/21      SLP LONG TERM GOAL #4   Title Pt will demonstrate 2-3 word finding strategies to decrease anomia in conversation independently.    Time 8    Period Weeks    Status New    Target Date 02/14/21              Plan - 01/18/21 1425     Clinical Impression Statement See tx note. Pt reported she felt the memory strategies have been helping at home. SLP rec skilled ST services to provide assistance with memory and attention strategies to increase independence and efficiency at home.    Speech Therapy Frequency 2x / week    Duration 8 weeks    Treatment/Interventions Compensatory strategies;Cueing hierarchy;Functional tasks;Patient/family education;Environmental controls;Cognitive reorganization;Multimodal communcation approach;Language facilitation;Compensatory techniques;Internal/external aids;SLP instruction and feedback    Potential to Achieve Goals Fair    Potential Considerations Ability to learn/carryover information    Consulted and Agree with  Plan of Care Patient             Patient  will benefit from skilled therapeutic intervention in order to improve the following deficits and impairments:   Cognitive communication deficit    Problem List Patient Active Problem List   Diagnosis Date Noted   Screening for malignant neoplasm of colon 01/10/2021   Change in bowel habit 01/10/2021   Flatulence, eructation and gas pain 01/10/2021   Internal hemorrhoids 01/10/2021   Ingrowing nail, left great toe 01/10/2021   Dyslipidemia 12/29/2020   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   Stroke (cerebrum) ([place]) 12/01/2020   History of stroke 07/14/2020   Anemia 07/11/2020   CVA (cerebral vascular accident) ([place]) 06/28/2020   Cerebral infarction ([place]) 06/25/2020   Vascular dementia with behavior disturbance 10/13/2019   Vascular dementia without behavioral disturbance ([place]) 07/15/2019   Cerebrovascular accident ([place]) 06/25/2019   History of COVID-19 04/22/2019   Essential hypertension 03/03/2019   Hypertensive urgency 03/03/2019   Speech Therapy Progress Note  Dates of Reporting Period: 01/04/21  to present  Subjective Statement: "I feel like my memory is improving with the strategies."  Objective: See previous tx notes.  Goal Update: Pt is making improvement towards goals. Provided handouts in enlarged print to facilitate reading acuity and increase comprehension. Pt is reviewing information at home and has been able to retain new information as indicated by her being able to recall strategies.    Plan: SLP to cont to facilitate increased recall of important information by addressing memory and communication strategies.   Reason Skilled Services are Required: SLP rec cont skilled ST services to maximize functional communication and increase independence at home.    [name], [place] 01/18/2021, 2:26 PM  [facility]. [place], [name],  [address] Phone: [phone]   Fax:  [phone]   Name: Jacqueline Bonilla MRN: [id number] Date of Birth: June 10, 1955

## 2021-01-19 ENCOUNTER — Telehealth: Payer: Self-pay | Admitting: Emergency Medicine

## 2021-01-19 ENCOUNTER — Encounter: Payer: Self-pay | Admitting: Emergency Medicine

## 2021-01-19 MED ORDER — TICAGRELOR 90 MG PO TABS: 90.0000 mg | ORAL_TABLET | Freq: Two times a day (BID) | ORAL | 1 refills | Status: AC

## 2021-01-19 NOTE — Telephone Encounter (Signed)
Patient spouse calling in to make sure we received refill request bc patient is completely out of medication  Advised him it has been received by provider

## 2021-01-19 NOTE — Telephone Encounter (Signed)
New prescription sent to pharmacy of record

## 2021-01-23 ENCOUNTER — Encounter: Payer: Self-pay | Admitting: Physical Therapy

## 2021-01-23 ENCOUNTER — Ambulatory Visit: Payer: Medicare HMO | Admitting: Occupational Therapy

## 2021-01-23 ENCOUNTER — Ambulatory Visit: Payer: Medicare HMO | Admitting: Physical Therapy

## 2021-01-23 ENCOUNTER — Ambulatory Visit: Payer: Medicare HMO | Admitting: Speech Pathology

## 2021-01-23 ENCOUNTER — Encounter: Payer: Self-pay | Admitting: Occupational Therapy

## 2021-01-23 ENCOUNTER — Other Ambulatory Visit: Payer: Self-pay

## 2021-01-23 DIAGNOSIS — M6281 Muscle weakness (generalized): Secondary | ICD-10-CM

## 2021-01-23 DIAGNOSIS — I63512 Cerebral infarction due to unspecified occlusion or stenosis of left middle cerebral artery: Secondary | ICD-10-CM

## 2021-01-23 DIAGNOSIS — R262 Difficulty in walking, not elsewhere classified: Secondary | ICD-10-CM | POA: Diagnosis not present

## 2021-01-23 DIAGNOSIS — R41842 Visuospatial deficit: Secondary | ICD-10-CM

## 2021-01-23 DIAGNOSIS — I6300 Cerebral infarction due to thrombosis of unspecified precerebral artery: Secondary | ICD-10-CM | POA: Diagnosis not present

## 2021-01-23 DIAGNOSIS — R278 Other lack of coordination: Secondary | ICD-10-CM

## 2021-01-23 DIAGNOSIS — R2689 Other abnormalities of gait and mobility: Secondary | ICD-10-CM | POA: Diagnosis not present

## 2021-01-23 DIAGNOSIS — R41844 Frontal lobe and executive function deficit: Secondary | ICD-10-CM

## 2021-01-23 DIAGNOSIS — R41841 Cognitive communication deficit: Secondary | ICD-10-CM

## 2021-01-23 NOTE — Therapy (Signed)
[facility] [place] [place]. [place], [name], [address] Phone: [phone]   Fax:  [phone]  Physical Therapy Treatment  Patient Details  Name: Jacqueline Bonilla MRN: [id number] Date of Birth: 10/12/1955 Referring Provider (PT): Sagardia   Encounter Date: 01/23/2021   PT End of Session - 01/23/21 1137     Visit Number 9    Number of Visits 12    Date for PT Re-Evaluation 02/03/21    PT Start Time 1101    PT Stop Time 1139    PT Time Calculation (min) 38 min    Activity Tolerance Patient tolerated treatment well    Behavior During Therapy [facility] for tasks assessed/performed             Past Medical History:  Diagnosis Date   Anemia    Anxiety    Depression    Dysmenorrhea    Endometriosis    Fibroid    Hypertension    Microhematuria    negative workup   Osteoporosis    Tachycardia    Thrombocytopenia ([place])     Past Surgical History:  Procedure Laterality Date   BREAST BIOPSY     BUBBLE STUDY  12/08/2020   Procedure: BUBBLE STUDY;  Surgeon: Josue Hector, MD;  Location: [facility];  Service: Cardiovascular;;   CESAREAN SECTION     hysteroscopic resection     implantable loop recorder implant  10/21/2019   Medtronic Reveal Linq model LNQ 22 ([place] [id number]) implantable loop recorder   IR CT HEAD LTD  12/01/2020   IR PERCUTANEOUS ART THROMBECTOMY/INFUSION INTRACRANIAL INC DIAG ANGIO  12/01/2020   IR US GUIDE VASC ACCESS RIGHT  12/01/2020   RADIOLOGY WITH ANESTHESIA N/A 12/01/2020   Procedure: IR WITH ANESTHESIA - CODE STROKE;  Surgeon: Radiologist, Medication, MD;  Location: [facility];  Service: Radiology;  Laterality: N/A;   TEE WITHOUT CARDIOVERSION N/A 12/08/2020   Procedure: TRANSESOPHAGEAL ECHOCARDIOGRAM (TEE);  Surgeon: Josue Hector, MD;  Location: [facility] ENDOSCOPY;  Service: Cardiovascular;  Laterality: N/A;    There were no vitals filed for this visit.                      [facility] Adult [facility]  Treatment/Exercise - 01/23/21 0001       Ambulation/Gait   Gait Comments Walked through clinic, focus on R arm swing, which improved with attention to it and imporved her pace and quality of stepping through B.      Neuro Re-ed    Neuro Re-ed Details  Stepping in floor ladder with attempts to control foot placement in the squares. She moved forward, then forward, placing both feet in the same box, then side step, then performed a pattern of forward, side , back, side, working her way donw to the L then the R x 2. difficutly at times iwth correct foot placement.      Knee/Hip Exercises: Aerobic   Elliptical 3 minutes at L1, focus on coordination. patient felt very unsteady on the machine.    Nustep L3 for 2 x 30 seconds for speed of movement.      Knee/Hip Exercises: Standing   SLS SLS on Airex pad, 2 x 30 seconds on each leg. Unble to sustain in R stance without UE support.    Other Standing Knee Exercises Side steps up onto Airex, then off, back and forth x 10 reps each way. Patient was steady throughout, with mild difficulty in foot placement  either direction.                       PT Short Term Goals - 01/11/21 1145       PT SHORT TERM GOAL #1   Title Pt will be I with initial HEP    Time 1    Period Weeks    Status On-going    Target Date 01/18/21               PT Long Term Goals - 01/09/21 0942       PT LONG TERM GOAL #4   Title Pt will report able to safely return to gym routine at [facility]    Time [place] - 01/23/21 1138     Clinical Impression Statement Patient reports no issues. she is wearing 2 tennis shoes today, able to tolerate more challenges. The Elliptical really gave her difficulty as she felt uncoordianted, but this may be good for her coordination.    Personal Factors and Comorbidities Comorbidity 2    Comorbidities hx of CVA, HTN    Examination-Activity Limitations  Stand;Locomotion Level    Examination-Participation Restrictions Community Activity;Interpersonal Relationship    Stability/Clinical Decision Making Evolving/Moderate complexity    Clinical Decision Making Moderate    Rehab Potential Good    PT Frequency 2x / week    PT Duration 2 weeks    PT Treatment/Interventions ADLs/Self Care Home Management;Gait training;Stair training;Functional mobility training;Therapeutic activities;Therapeutic exercise;Balance training;Neuromuscular re-education;Manual techniques;Patient/family education    PT Next Visit Plan work on balance, functional gait, safety, strength    PT Home Exercise Plan Access Code: [email or web address]    Consulted and Agree with Plan of Care Patient             Patient will benefit from skilled therapeutic intervention in order to improve the following deficits and impairments:  Difficulty walking, Abnormal gait, Impaired UE functional use, Decreased endurance, Cardiopulmonary status limiting activity, Decreased activity tolerance, Impaired vision/preception, Decreased balance, Postural dysfunction, Decreased strength, Decreased mobility  Visit Diagnosis: Balance problem  Difficulty in walking, not elsewhere classified  Muscle weakness (generalized)  Left middle cerebral artery stroke ([facility])  Other lack of coordination     Problem List Patient Active Problem List   Diagnosis Date Noted   Screening for malignant neoplasm of colon 01/10/2021   Change in bowel habit 01/10/2021   Flatulence, eructation and gas pain 01/10/2021   Internal hemorrhoids 01/10/2021   Ingrowing nail, left great toe 01/10/2021   Dyslipidemia 12/29/2020   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 [place], [name], [address] Phone: [phone]   Fax:  [phone]  Name: Jacqueline Bonilla MRN: [id number] Date of Birth: 12-24-1955

## 2021-01-23 NOTE — Therapy (Signed)
[place] [place], [name], [address] Phone: [phone]   Fax:  [phone]  Occupational Therapy Treatment  Patient Details  Name: Jacqueline Bonilla MRN: [id number] Date of Birth: 1955-07-15 Referring Provider (OT): Lauraine Rinne, PA-C   Encounter Date: 01/23/2021   OT End of Session - 01/23/21 1016     Visit Number 11    Number of Visits 17    Date for OT Re-Evaluation 03/20/21    Authorization Type [employer]    Authorization - Visit Number 11    Authorization - Number of Visits 17    OT Start Time 1013    OT Stop Time 1055    OT Time Calculation (min) 42 min    Activity Tolerance Patient tolerated treatment well    Behavior During Therapy WFL for tasks assessed/performed            Past Medical History:  Diagnosis Date   Anemia    Anxiety    Depression    Dysmenorrhea    Endometriosis    Fibroid    Hypertension    Microhematuria    negative workup   Osteoporosis    Tachycardia    Thrombocytopenia ([place])     Past Surgical History:  Procedure Laterality Date   BREAST BIOPSY     BUBBLE STUDY  12/08/2020   Procedure: BUBBLE STUDY;  Surgeon: Josue Hector, MD;  Location: [facility];  Service: Cardiovascular;;   CESAREAN SECTION     hysteroscopic resection     implantable loop recorder implant  10/21/2019   Medtronic Reveal Linq model LNQ 22 ([place] [id number]) implantable loop recorder   IR CT HEAD LTD  12/01/2020   IR PERCUTANEOUS ART THROMBECTOMY/INFUSION INTRACRANIAL INC DIAG ANGIO  12/01/2020   IR US GUIDE VASC ACCESS RIGHT  12/01/2020   RADIOLOGY WITH ANESTHESIA N/A 12/01/2020   Procedure: IR WITH ANESTHESIA - CODE STROKE;  Surgeon: Radiologist, Medication, MD;  Location: [facility];  Service: Radiology;  Laterality: N/A;   TEE WITHOUT CARDIOVERSION N/A 12/08/2020   Procedure: TRANSESOPHAGEAL ECHOCARDIOGRAM (TEE);  Surgeon: Josue Hector, MD;  Location: [facility] ENDOSCOPY;  Service:  Cardiovascular;  Laterality: N/A;    There were no vitals filed for this visit.   Subjective Assessment - 01/23/21 1014     Subjective  Pt reports her MusicGlove arrived yesterday and she used it on her L hand and feels like it really helped her R hand dexterity    Pertinent History Cerebral infarction 12/01/20; MRI indicated small acute infarcts in L MCA distribution and SAH overlying bilateral cerebral hemispheres. PMH includes 2 prior strokes in June 2021 (R-sided weakness) and June 2022 (L-sided weakness); vascular dementia; HTN; loop recorder implant 10/2019; osteoporosis; thrombocytopenia; anxiety; anemia    Patient Stated Goals "Anything I can to get better"    Currently in Pain? No/denies             Treatment/Exercises - 01/23/21 1047     ADLs: Grooming  Demonstrated use of 2-in-1 hair dyer brush using BUEs w/ Mod I    ADLs: Cutting Food Simulated cutting food (med-soft putty) using a knife and fork w/ built-up handles to assess participation, positioning, and problem-solving during activity. Pt demonstrated trying to push food laterally w/ knife vs using RUE to slice knife forward/back. OT trialed rocker knife w/ pt able to return demonstration w/ improved success compared to standard knife and fork after initial demonstration; requiring occasional verbal cues to problem-solve  UE adjustment to facilitate success     Bilateral Coordination Placing and removing resistance clothespins (1-8#) onto rope held at an incline w/ contralateral UE; pt alternating between using LUE and RUE to challenge problem-solving and bilateral coordination. OT provided mod verbal/gestural cues to facilitate UE positional adjustments for success (e.g., rotating rope or hand w/ clothespin)  Pt removed resistance clothespins off edge of bowl, completing 2 sets w/ each UE; OT emphasized adjusting UE to improve positioning vs moving bowl and provided occasional verbal cues to assist w/ problem-solving             OT Short Term Goals - 01/18/21 1233       OT SHORT TERM GOAL #1   Title Pt will improve participation in manipulation of clothing fasteners as evidenced by completing 9-Hole Peg Test w/ less than 5 drops using R, dominant hand    Baseline R hand 2 min, 20 sec w/ 7 drops    Time 4    Period Weeks    Status Achieved   01/06/21 - 3 drops (1 min, 23 sec)   Target Date 01/17/21      OT SHORT TERM GOAL #2   Title Pt will improve safety w/ functional overhead reach by increasing strength of R shoulder flexion to at least 4-/5    Baseline 3+/5 (full ROM against gravity; slight resistance)    Time 4    Period Weeks    Status Achieved   01/18/21 - 4-/5   Target Date 01/17/21      OT SHORT TERM GOAL #3   Title Pt will demonstrate ability to thread first sleeve into jacket to improve initiation of task and independence w/ BADLs    Baseline Min A to initiate task    Time 4    Period Weeks    Status Achieved   01/05/20 - Mod I w/ donning jacket; assist only w/ threading zipper   Target Date 01/17/21             OT Long Term Goals - 01/18/21 1049       OT LONG TERM GOAL #1   Title Pt will demonstrate independence w/ full HEP designed for BUE strengthening, GM/FMC, and coordination by discharge    Baseline No HEP at this time    Time 8    Period Weeks    Status On-going    Target Date 02/14/21      OT LONG TERM GOAL #2   Title Pt will improve RUE GMC and coordination for improved participation in ADLs as evidenced by increasing Box and Blocks score by at least 5 blocks    Baseline 01/06/21 - 20 blocks    Time 8    Period Weeks    Status On-going   01/18/21 - 24 blocks   Target Date 02/14/21      OT LONG TERM GOAL #3   Title Pt will be able to thread zipper w/ Mod I, using AE prn, in at least 1 attempt    Baseline Unable to thread zipper    Time 8    Period Weeks    Status Achieved   01/18/21 - Mod I (extended time; no AE)   Target Date 02/14/21      OT LONG TERM GOAL #4    Title Pt will be able to complete simulated self-feeding w/ Mod I (scooping, bringing utensil to mouth, cutting food, etc.) using AE prn    Baseline Reports difficulty manipulating utensils  Time 8    Period Weeks    Status On-going   01/18/21 - reports continued difficulty only w/ cutting tough foods   Target Date 02/14/21      OT LONG TERM GOAL #5   Title Pt will be able to don socks w/ Mod I, using AE prn, in at least 1 attempt by d/c    Baseline Needs assist w/ donning socks    Time 8    Period Weeks    Status Achieved   01/18/21 - demonstrated w/ Mod I   Target Date 02/14/21      OT LONG TERM GOAL #6   Title Pt will improve R hand grip strength by at least 5 lbs to improve safety w/ IADLs and decrease object drops/spills    Baseline 01/18/21 - RUE 24 lbs (dominant side); LUE 35#    Time 8    Period Weeks    Status New    Target Date 02/14/21             Plan - 01/23/21 1017     Clinical Impression Statement Pt was very excited about using her MusicGlove and is hopeful that it will help w/ her [facility] and dexterity. Pt also arrived w/ 2-in-1 hair dryer brush this session, reporting that she tried using that to decrease the amount of hair tools she needed to use, but that most difficulty occurs when using her flat iron. OT recommended pt bring this in next session to address safety and problem-solving and/or to develop compensatory strategies prn. OT then addressed cutting of tougher foods due to pt's report that she does not complete this at home despite having good gross grasp/release and hand strength. Rocker knife introduced to decrease demand on bilateral coordination (slicing knife forward/back while stabilizing w/ fork) and success improved notably w/ some verbal cues and gestural/tactile cues to improve problem-solving during activity. OT also continued to address asymmetrical bilateral coordination w/ pt demonstrating increased success when able to visually attend to task.    OT  Occupational Profile and History Detailed Assessment- Review of Records and additional review of physical, cognitive, psychosocial history related to current functional performance    Occupational performance deficits (Please refer to evaluation for details): ADL's;IADL's;Rest and Sleep;Leisure;Social Participation    Body Structure / Function / Physical Skills ADL;Decreased knowledge of use of DME;Gait;Strength;Balance;Dexterity;GMC;Tone;Body mechanics;Proprioception;UE functional use;IADL;Endurance;ROM;Vision;Coordination;Mobility;Sensation;FMC    Cognitive Skills Memory;Problem Solve;Safety Awareness;Sequencing    Rehab Potential Fair   Due to comorbidities   Clinical Decision Making Several treatment options, min-mod task modification necessary    Comorbidities Affecting Occupational Performance: Presence of comorbidities impacting occupational performance    Comorbidities impacting occupational performance description: Vascular dementia; anxiety    Modification or Assistance to Complete Evaluation  Min-Moderate modification of tasks or assist with assess necessary to complete eval    OT Frequency 2x / week    OT Duration 8 weeks    OT Treatment/Interventions Self-care/ADL training;Aquatic Therapy;Electrical Stimulation;Moist Heat;Cryotherapy;Therapeutic exercise;Neuromuscular education;Energy conservation;Manual Therapy;Functional Mobility Training;DME and/or AE instruction;Passive range of motion;Patient/family education;Visual/perceptual remediation/compensation;Cognitive remediation/compensation;Therapeutic activities;Splinting;Balance training    Plan Practice grooming tasks (pt requested to bring in straightener); continue w/ asymmetrical bilateral coordination and multi-step sequencing/problem-solving    OT Home Exercise Plan MedBridge Code: [id number] - shoulder flex/ext w/ theraband; weight shift L/R in quadruped; scapular retraction on forearms in prone; gross grasp w/ putty; 3-pt and  lateral pinch w/ putty    Recommended Other Services Currently receiving ST and PT services at this location  Consulted and Agree with Plan of Care Patient            Patient will benefit from skilled therapeutic intervention in order to improve the following deficits and impairments:   Body Structure / Function / Physical Skills: ADL, Decreased knowledge of use of DME, Gait, Strength, Balance, Dexterity, GMC, Tone, Body mechanics, Proprioception, UE functional use, IADL, Endurance, ROM, Vision, Coordination, Mobility, Sensation, [facility] Cognitive Skills: Memory, Problem Solve, Safety Awareness, Sequencing   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 [facility])  Other lack of coordination  Muscle weakness (generalized)  Visuospatial deficit  Frontal lobe and executive function deficit  Other abnormalities of gait and mobility   Problem List Patient Active Problem List   Diagnosis Date Noted   Screening for malignant neoplasm of colon 01/10/2021   Change in bowel habit 01/10/2021   Flatulence, eructation and gas pain 01/10/2021   Internal hemorrhoids 01/10/2021   Ingrowing nail, left great toe 01/10/2021   Dyslipidemia 12/29/2020   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 [place], [name], [address] Phone: [phone]   Fax:  [phone]  Name: Jacqueline Bonilla MRN:  [id number] Date of Birth: 1955/12/07

## 2021-01-24 ENCOUNTER — Other Ambulatory Visit: Payer: Self-pay

## 2021-01-24 ENCOUNTER — Other Ambulatory Visit: Payer: Self-pay | Admitting: Physical Medicine & Rehabilitation

## 2021-01-24 ENCOUNTER — Encounter: Payer: Self-pay | Admitting: Speech Pathology

## 2021-01-24 MED ORDER — LEVETIRACETAM 500 MG PO TABS: 500.0000 mg | ORAL_TABLET | Freq: Two times a day (BID) | ORAL | 0 refills | Status: DC

## 2021-01-24 NOTE — Therapy (Signed)
[place] [place], [name], [address] Phone: [phone]   Fax:  [phone]  Speech Language Pathology Treatment  Patient Details  Name: VERTA Bonilla MRN: [id number] Date of Birth: [birth year] Referring Provider (SLP): Dawayne Patricia   Encounter Date: 01/23/2021   End of Session - 01/24/21 1450     Visit Number 11    Number of Visits 17    Date for SLP Re-Evaluation 02/20/21    SLP Start Time 0930    SLP Stop Time  2683    SLP Time Calculation (min) 45 min    Activity Tolerance Patient tolerated treatment well             Past Medical History:  Diagnosis Date   Anemia    Anxiety    Depression    Dysmenorrhea    Endometriosis    Fibroid    Hypertension    Microhematuria    negative workup   Osteoporosis    Tachycardia    Thrombocytopenia ([place])     Past Surgical History:  Procedure Laterality Date   BREAST BIOPSY     BUBBLE STUDY  12/08/2020   Procedure: BUBBLE STUDY;  Surgeon: Josue Hector, MD;  Location: [facility];  Service: Cardiovascular;;   CESAREAN SECTION     hysteroscopic resection     implantable loop recorder implant  10/21/2019   Medtronic Reveal Linq model LNQ 22 ([place] [id number]) implantable loop recorder   IR CT HEAD LTD  12/01/2020   IR PERCUTANEOUS ART THROMBECTOMY/INFUSION INTRACRANIAL INC DIAG ANGIO  12/01/2020   IR US GUIDE VASC ACCESS RIGHT  12/01/2020   RADIOLOGY WITH ANESTHESIA N/A 12/01/2020   Procedure: IR WITH ANESTHESIA - CODE STROKE;  Surgeon: Radiologist, Medication, MD;  Location: [facility];  Service: Radiology;  Laterality: N/A;   TEE WITHOUT CARDIOVERSION N/A 12/08/2020   Procedure: TRANSESOPHAGEAL ECHOCARDIOGRAM (TEE);  Surgeon: Josue Hector, MD;  Location: [facility] ENDOSCOPY;  Service: Cardiovascular;  Laterality: N/A;    There were no vitals filed for this visit.   Subjective Assessment - 01/24/21 1449     Subjective "Things are going good."     Currently in Pain? No/denies                   ADULT SLP TREATMENT - 01/24/21 1452       General Information   Behavior/Cognition Alert;Cooperative;Pleasant mood      Treatment Provided   Treatment provided Cognitive-Linquistic      Cognitive-Linquistic Treatment   Treatment focused on Cognition    Skilled Treatment SLP and pt discussed goals progress. Pt has reported that she feels like her memory has improved using the strategies SLP has provided. Discussed the importance of "double checking work" for accuracy. Completed cognitive activity where she arranged words from least to most. Pt made 5 errors and self corrected 4 out of 20 possible questions.      Assessment / Recommendations / Plan   Plan Continue with current plan of care      Progression Toward Goals   Progression toward goals Progressing toward goals                SLP Short Term Goals - 01/17/21 1446       SLP SHORT TERM GOAL #1   Title Pt will provide 3 areas of challenge for her given min verbal cues to demonstrate increased awareness.    Time 4    Period  Weeks    Status Achieved    Target Date 01/17/21      SLP SHORT TERM GOAL #2   Title Pt will recall 3 memory strategies to aid recall of important information with minA.    Time 4    Period Weeks    Status Achieved    Target Date 01/17/21      SLP SHORT TERM GOAL #3   Title Pt will identify errors during structured cognitive task with modA verbal cues.    Time 4    Period Weeks    Status Not Met    Target Date 01/17/21      SLP SHORT TERM GOAL #4   Title Pt will recall 3 word finding strategies to decrease anomia in conversation given minA.    Time 4    Period Weeks    Status Not Met    Target Date 01/17/21              SLP Long Term Goals - 12/20/20 1711       SLP LONG TERM GOAL #1   Title Pt will provide 3 areas of challenge for her independently to demonstrate increased awareness of deficits.    Time 8    Period  Weeks    Status New    Target Date 02/14/21      SLP LONG TERM GOAL #2   Title Patient will demonstrate use of memory strategies to schedule activities, recall weekly events and items to maintain safety to participate socially in functional living environment    Time 8    Period Weeks    Status New    Target Date 02/14/21      SLP LONG TERM GOAL #3   Title Pt will identify errors during structured cognitive task with minA verbal cues.    Time 8    Period Weeks    Status New    Target Date 02/14/21      SLP LONG TERM GOAL #4   Title Pt will demonstrate 2-3 word finding strategies to decrease anomia in conversation independently.    Time 8    Period Weeks    Status New    Target Date 02/14/21              Plan - 01/24/21 1451     Clinical Impression Statement See tx note. Pt reported she felt the memory strategies have been helping at home. SLP rec skilled ST services to provide assistance with memory and attention strategies to increase independence and efficiency at home.    Speech Therapy Frequency 2x / week    Duration 8 weeks    Treatment/Interventions Compensatory strategies;Cueing hierarchy;Functional tasks;Patient/family education;Environmental controls;Cognitive reorganization;Multimodal communcation approach;Language facilitation;Compensatory techniques;Internal/external aids;SLP instruction and feedback    Potential to Achieve Goals Fair    Potential Considerations Ability to learn/carryover information    Consulted and Agree with Plan of Care Patient             Patient will benefit from skilled therapeutic intervention in order to improve the following deficits and impairments:   Cognitive communication deficit    Problem List Patient Active Problem List   Diagnosis Date Noted   Screening for malignant neoplasm of colon 01/10/2021   Change in bowel habit 01/10/2021   Flatulence, eructation and gas pain 01/10/2021   Internal hemorrhoids 01/10/2021    Ingrowing nail, left great toe 01/10/2021   Dyslipidemia 12/29/2020   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 [place], [name], [address] Phone: [phone]   Fax:  [phone]   Name: Jacqueline Bonilla MRN: [id number] Date of Birth: [birth year]

## 2021-01-25 ENCOUNTER — Ambulatory Visit: Payer: Medicare HMO | Admitting: Physical Therapy

## 2021-01-25 ENCOUNTER — Other Ambulatory Visit: Payer: Self-pay

## 2021-01-25 ENCOUNTER — Ambulatory Visit: Payer: Medicare HMO | Admitting: Occupational Therapy

## 2021-01-25 ENCOUNTER — Encounter: Payer: Self-pay | Admitting: Physical Therapy

## 2021-01-25 ENCOUNTER — Encounter: Payer: Self-pay | Admitting: Speech Pathology

## 2021-01-25 ENCOUNTER — Ambulatory Visit: Payer: Medicare HMO | Admitting: Speech Pathology

## 2021-01-25 ENCOUNTER — Encounter: Payer: Self-pay | Admitting: Occupational Therapy

## 2021-01-25 DIAGNOSIS — R262 Difficulty in walking, not elsewhere classified: Secondary | ICD-10-CM

## 2021-01-25 DIAGNOSIS — R41842 Visuospatial deficit: Secondary | ICD-10-CM | POA: Diagnosis not present

## 2021-01-25 DIAGNOSIS — R41844 Frontal lobe and executive function deficit: Secondary | ICD-10-CM | POA: Diagnosis not present

## 2021-01-25 DIAGNOSIS — I6300 Cerebral infarction due to thrombosis of unspecified precerebral artery: Secondary | ICD-10-CM | POA: Diagnosis not present

## 2021-01-25 DIAGNOSIS — R2689 Other abnormalities of gait and mobility: Secondary | ICD-10-CM | POA: Diagnosis not present

## 2021-01-25 DIAGNOSIS — I63512 Cerebral infarction due to unspecified occlusion or stenosis of left middle cerebral artery: Secondary | ICD-10-CM | POA: Diagnosis not present

## 2021-01-25 DIAGNOSIS — R41841 Cognitive communication deficit: Secondary | ICD-10-CM

## 2021-01-25 DIAGNOSIS — R278 Other lack of coordination: Secondary | ICD-10-CM

## 2021-01-25 DIAGNOSIS — M6281 Muscle weakness (generalized): Secondary | ICD-10-CM

## 2021-01-25 NOTE — Therapy (Signed)
[place]. [place], [name], [address] Phone: [phone]   Fax:  [phone]  Physical Therapy Treatment Progress Note Reporting Period 01/04/21 to 01/25/21  See note below for Objective Data and Assessment of Progress/Goals.     Patient Details  Name: Jacqueline Bonilla MRN: [id number] Date of Birth: [birth year] Referring Provider (PT): Sagardia   Encounter Date: 01/25/2021   PT End of Session - 01/25/21 1135     Visit Number 10    Number of Visits 12    Date for PT Re-Evaluation 02/03/21    PT Start Time 1101    PT Stop Time 1140    PT Time Calculation (min) 39 min    Equipment Utilized During Treatment Gait belt    Activity Tolerance Patient tolerated treatment well    Behavior During Therapy WFL for tasks assessed/performed             Past Medical History:  Diagnosis Date   Anemia    Anxiety    Depression    Dysmenorrhea    Endometriosis    Fibroid    Hypertension    Microhematuria    negative workup   Osteoporosis    Tachycardia    Thrombocytopenia ([place])     Past Surgical History:  Procedure Laterality Date   BREAST BIOPSY     BUBBLE STUDY  12/08/2020   Procedure: BUBBLE STUDY;  Surgeon: Josue Hector, MD;  Location: [facility];  Service: Cardiovascular;;   CESAREAN SECTION     hysteroscopic resection     implantable loop recorder implant  10/21/2019   Medtronic Reveal Linq model LNQ 22 ([place] [id number]) implantable loop recorder   IR CT HEAD LTD  12/01/2020   IR PERCUTANEOUS ART THROMBECTOMY/INFUSION INTRACRANIAL INC DIAG ANGIO  12/01/2020   IR US GUIDE VASC ACCESS RIGHT  12/01/2020   RADIOLOGY WITH ANESTHESIA N/A 12/01/2020   Procedure: IR WITH ANESTHESIA - CODE STROKE;  Surgeon: Radiologist, Medication, MD;  Location: [facility];  Service: Radiology;  Laterality: N/A;   TEE WITHOUT CARDIOVERSION N/A 12/08/2020   Procedure: TRANSESOPHAGEAL ECHOCARDIOGRAM (TEE);  Surgeon: Josue Hector,  MD;  Location: [facility] ENDOSCOPY;  Service: Cardiovascular;  Laterality: N/A;    There were no vitals filed for this visit.                      [facility] Adult PT Treatment/Exercise - 01/25/21 0001       Knee/Hip Exercises: Aerobic   Nustep L5 x 6 minutes.      Knee/Hip Exercises: Plyometrics   Other Plyometric Exercises Jump switches with feet out and back and feet in staggered stance. She had great diffiuclty iwth foot placement in staggered stance, imporved when target placed on floor to position the front foot. BUe support and CGA.      Knee/Hip Exercises: Standing   SLS SLS alternately tapping cones placed in a semi cirecle in front of her. proressed form 1 cone at a time to 2, required occasional min A for balance.    Other Standing Knee Exercises Side step along Airex balance beam without UE support, 3 x each direction, CGA.                       PT Short Term Goals - 01/11/21 1145       PT SHORT TERM GOAL #1   Title Pt will be I with initial HEP  Time 1    Period Weeks    Status On-going    Target Date 01/18/21               PT Long Term Goals - 01/25/21 1143       PT LONG TERM GOAL #1   Title Pt will demo improved Berg Balance score to 52/56 to demo decreased fall risk    Baseline 42/56    Time 2    Period Weeks    Status On-going    Target Date 02/08/21      PT LONG TERM GOAL #2   Title Pt will demo 5x STS <15 seconds with no UE assist    Baseline 21 seconds with occasional UE assist and LEs intermittently locking on back of chair    Time 2    Period Weeks    Status On-going    Target Date 02/08/21      PT LONG TERM GOAL #3   Title Pt will demo gait >1000 ft over level/unlevel surfaces with no AD and supervision assist    Status Achieved      PT LONG TERM GOAL #4   Title Pt will report able to safely return to gym routine at [place] #5   Title incresae LE strength to 4+/5     Baseline 4/5    Time 2    Period Weeks    Status On-going    Target Date 02/08/21                   Plan - 01/25/21 1140     Clinical Impression Statement Patient's HEP updated today as she reports she has largely not been performingit, although she is exercising daily. Activities focused on balance and coordination. She improves when she slows down and gets set before attempting to perform the activity.    Personal Factors and Comorbidities Comorbidity 2    Comorbidities hx of CVA, HTN    Examination-Activity Limitations Stand;Locomotion Level    Examination-Participation Restrictions Community Activity;Interpersonal Relationship    Stability/Clinical Decision Making Evolving/Moderate complexity    Rehab Potential Good    PT Frequency 2x / week    PT Duration 2 weeks    PT Treatment/Interventions ADLs/Self Care Home Management;Gait training;Stair training;Functional mobility training;Therapeutic activities;Therapeutic exercise;Balance training;Neuromuscular re-education;Manual techniques;Patient/family education    PT Next Visit Plan work on balance, functional gait, safety, strength    PT Home Exercise Plan Access Code:  [email or web address]    Consulted and Agree with Plan of Care Patient             Patient will benefit from skilled therapeutic intervention in order to improve the following deficits and impairments:  Difficulty walking, Abnormal gait, Impaired UE functional use, Decreased endurance, Cardiopulmonary status limiting activity, Decreased activity tolerance, Impaired vision/preception, Decreased balance, Postural dysfunction, Decreased strength, Decreased mobility  Visit Diagnosis: Balance problem  Difficulty in walking, not elsewhere classified  Muscle weakness (generalized)  Left middle cerebral artery stroke [facility])  Other lack of coordination     Problem List Patient Active Problem List   Diagnosis Date Noted   Screening for malignant neoplasm of colon  01/10/2021   Change in bowel habit 01/10/2021   Flatulence, eructation and gas pain 01/10/2021   Internal hemorrhoids 01/10/2021   Ingrowing nail, left great toe 01/10/2021   Dyslipidemia 12/29/2020   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 [place], [name], [address] Phone: [phone]   Fax:  [phone]  Name: Jacqueline Bonilla MRN: [id number] Date of Birth: 04-14-1955

## 2021-01-25 NOTE — Therapy (Signed)
[place]. [place], [name], [address] Phone: [phone]   Fax:  [phone]  Speech Language Pathology Treatment  Patient Details  Name: Jacqueline Bonilla MRN: [id number] Date of Birth: 04/23/1955 Referring Provider (SLP): Dawayne Patricia   Encounter Date: 01/25/2021   End of Session - 01/25/21 0938     Visit Number 12    Number of Visits 17    Date for SLP Re-Evaluation 02/20/21    SLP Start Time 0935    SLP Stop Time  6503    SLP Time Calculation (min) 40 min    Activity Tolerance Patient tolerated treatment well             Past Medical History:  Diagnosis Date   Anemia    Anxiety    Depression    Dysmenorrhea    Endometriosis    Fibroid    Hypertension    Microhematuria    negative workup   Osteoporosis    Tachycardia    Thrombocytopenia ([place])     Past Surgical History:  Procedure Laterality Date   BREAST BIOPSY     BUBBLE STUDY  12/08/2020   Procedure: BUBBLE STUDY;  Surgeon: Josue Hector, MD;  Location: [facility];  Service: Cardiovascular;;   CESAREAN SECTION     hysteroscopic resection     implantable loop recorder implant  10/21/2019   Medtronic Reveal Linq model LNQ 22 ([place] [id number]) implantable loop recorder   IR CT HEAD LTD  12/01/2020   IR PERCUTANEOUS ART THROMBECTOMY/INFUSION INTRACRANIAL INC DIAG ANGIO  12/01/2020   IR US GUIDE VASC ACCESS RIGHT  12/01/2020   RADIOLOGY WITH ANESTHESIA N/A 12/01/2020   Procedure: IR WITH ANESTHESIA - CODE STROKE;  Surgeon: Radiologist, Medication, MD;  Location: [facility];  Service: Radiology;  Laterality: N/A;   TEE WITHOUT CARDIOVERSION N/A 12/08/2020   Procedure: TRANSESOPHAGEAL ECHOCARDIOGRAM (TEE);  Surgeon: Josue Hector, MD;  Location: [facility] ENDOSCOPY;  Service: Cardiovascular;  Laterality: N/A;    There were no vitals filed for this visit.   Subjective Assessment - 01/25/21 0938     Subjective "I brought my paper we did."     Currently in Pain? No/denies                   ADULT SLP TREATMENT - 01/25/21 1306       General Information   Behavior/Cognition Alert;Cooperative;Pleasant mood      Treatment Provided   Treatment provided Cognitive-Linquistic      Cognitive-Linquistic Treatment   Treatment focused on Cognition    Skilled Treatment SLP suggested her husband, [name], come to therapy so we are able to check how things are going at home. Edu on Spaced Retrieval as an evidenced-based memory tool. Recalled "bring therapy folder" for up to 8 minutes. Will continue to practice information at home to reinforce memory.      Assessment / Recommendations / Plan   Plan Continue with current plan of care      Progression Toward Goals   Progression toward goals Progressing toward goals                SLP Short Term Goals - 01/17/21 1446       SLP SHORT TERM GOAL #1   Title Pt will provide 3 areas of challenge for her given min verbal cues to demonstrate increased awareness.    Time 4    Period Weeks    Status Achieved  Target Date 01/17/21      SLP SHORT TERM GOAL #2   Title Pt will recall 3 memory strategies to aid recall of important information with minA.    Time 4    Period Weeks    Status Achieved    Target Date 01/17/21      SLP SHORT TERM GOAL #3   Title Pt will identify errors during structured cognitive task with modA verbal cues.    Time 4    Period Weeks    Status Not Met    Target Date 01/17/21      SLP SHORT TERM GOAL #4   Title Pt will recall 3 word finding strategies to decrease anomia in conversation given minA.    Time 4    Period Weeks    Status Not Met    Target Date 01/17/21              SLP Long Term Goals - 12/20/20 1711       SLP LONG TERM GOAL #1   Title Pt will provide 3 areas of challenge for her independently to demonstrate increased awareness of deficits.    Time 8    Period Weeks    Status New    Target Date 02/14/21      SLP  LONG TERM GOAL #2   Title Patient will demonstrate use of memory strategies to schedule activities, recall weekly events and items to maintain safety to participate socially in functional living environment    Time 8    Period Weeks    Status New    Target Date 02/14/21      SLP LONG TERM GOAL #3   Title Pt will identify errors during structured cognitive task with minA verbal cues.    Time 8    Period Weeks    Status New    Target Date 02/14/21      SLP LONG TERM GOAL #4   Title Pt will demonstrate 2-3 word finding strategies to decrease anomia in conversation independently.    Time 8    Period Weeks    Status New    Target Date 02/14/21              Plan - 01/25/21 0939     Clinical Impression Statement See tx note. Pt reported she felt the memory strategies have been helping at home. SLP rec skilled ST services to provide assistance with memory and attention strategies to increase independence and efficiency at home.    Speech Therapy Frequency 2x / week    Duration 8 weeks    Treatment/Interventions Compensatory strategies;Cueing hierarchy;Functional tasks;Patient/family education;Environmental controls;Cognitive reorganization;Multimodal communcation approach;Language facilitation;Compensatory techniques;Internal/external aids;SLP instruction and feedback    Potential to Achieve Goals Fair    Potential Considerations Ability to learn/carryover information    Consulted and Agree with Plan of Care Patient             Patient will benefit from skilled therapeutic intervention in order to improve the following deficits and impairments:   Cognitive communication deficit    Problem List Patient Active Problem List   Diagnosis Date Noted   Screening for malignant neoplasm of colon 01/10/2021   Change in bowel habit 01/10/2021   Flatulence, eructation and gas pain 01/10/2021   Internal hemorrhoids 01/10/2021   Ingrowing nail, left great toe 01/10/2021    Dyslipidemia 12/29/2020   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. [place], [name], [address] Phone: [phone]   Fax:  [phone]   Name: Jacqueline Bonilla MRN: [id number] Date of Birth: [birth year]

## 2021-01-25 NOTE — Patient Instructions (Signed)
Access Code: [email or web address] URL: [email or web address] Date: 01/25/2021 Prepared by: Ethel Rana  Program Notes Perform jump/switches in front of counter.  1) Jump and move feet out apart, then jump and move feet back together. 2) Jump and move one foot in front, the other behind. Then jump and switch the front foot. Can place theraband on the floor to give you a target for the front foot each time.   Exercises Braided Sidestepping - 1 x daily - 7 x weekly - 2 sets - 10 reps Forward Monster Walks - 1 x daily - 7 x weekly - 3 sets - 10 reps Side Stepping with Resistance at Thighs - 1 x daily - 7 x weekly - 3 sets - 10 reps

## 2021-01-26 NOTE — Therapy (Signed)
[facility] [place], [name], [address] Phone: [phone]   Fax:  [phone]  Occupational Therapy Treatment  Patient Details  Name: Jacqueline Bonilla MRN: [id number] Date of Birth: [birth year] Referring Provider (OT): Lauraine Rinne, PA-C   Encounter Date: 01/25/2021   OT End of Session - 01/25/21 1016     Visit Number 12    Number of Visits 17    Date for OT Re-Evaluation 03/20/21    Authorization Type [employer]    Authorization - Visit Number 11    Authorization - Number of Visits 17    OT Start Time 1012    OT Stop Time 1100    OT Time Calculation (min) 48 min    Activity Tolerance Patient tolerated treatment well    Behavior During Therapy [facility] for tasks assessed/performed            Past Medical History:  Diagnosis Date   Anemia    Anxiety    Depression    Dysmenorrhea    Endometriosis    Fibroid    Hypertension    Microhematuria    negative workup   Osteoporosis    Tachycardia    Thrombocytopenia ([place])     Past Surgical History:  Procedure Laterality Date   BREAST BIOPSY     BUBBLE STUDY  12/08/2020   Procedure: BUBBLE STUDY;  Surgeon: Josue Hector, MD;  Location: [place];  Service: Cardiovascular;;   CESAREAN SECTION     hysteroscopic resection     implantable loop recorder implant  10/21/2019   Medtronic Reveal Linq model LNQ 22 ([place] [id number]) implantable loop recorder   IR CT HEAD LTD  12/01/2020   IR PERCUTANEOUS ART THROMBECTOMY/INFUSION INTRACRANIAL INC DIAG ANGIO  12/01/2020   IR US GUIDE VASC ACCESS RIGHT  12/01/2020   RADIOLOGY WITH ANESTHESIA N/A 12/01/2020   Procedure: IR WITH ANESTHESIA - CODE STROKE;  Surgeon: Radiologist, Medication, MD;  Location: [place];  Service: Radiology;  Laterality: N/A;   TEE WITHOUT CARDIOVERSION N/A 12/08/2020   Procedure: TRANSESOPHAGEAL ECHOCARDIOGRAM (TEE);  Surgeon: Josue Hector, MD;  Location: [facility] ENDOSCOPY;  Service:  Cardiovascular;  Laterality: N/A;    There were no vitals filed for this visit.   Subjective Assessment - 01/25/21 1014     Subjective  Pt states she does not want to be able to use her flat iron anymore    Pertinent History Cerebral infarction 12/01/20; MRI indicated small acute infarcts in L MCA distribution and SAH overlying bilateral cerebral hemispheres. PMH includes 2 prior strokes in June 2021 (R-sided weakness) and June 2022 (L-sided weakness); vascular dementia; HTN; loop recorder implant 10/2019; osteoporosis; thrombocytopenia; anxiety; anemia    Patient Stated Goals "Anything I can to get better"    Currently in Pain? No/denies             Treatment/Exercises - 01/25/21    Bilateral Coordination Practiced cutting w/ standard scissors. OT first attempted pt to follow dotted line, but pt was unable to determine how to initiate task since start of line was not at edge of paper. Activity downgraded to cutting across thick lines along length of paper w/out difficulty. After continued success, OT graded activity back up to completing curved lines w/ min deviations off line. OT provided mod assist w/ problem-solving and positioning of L hand to ensure it was away from scissors path; pt demonstrated fair coordination of adjusting L hand while progressing w/ cutting using  R hand. Pt also consistently holding paper past midline toward L side while cutting despite visual aid (mirror) and verbal cues to attempt to correct alignment/positioning.    Handwriting Practiced handwriting on standard lined paper using standard pen w/ fair legibility. Pt demonstrated difficulty w/ letter formation only w/ capital 'G'. Due to orientation of all letters tilted toward L and increasingly hovering above bottom line, OT used visual cues (dots) for letter alignment and orientation; pt demonstrated significant difficulty w/ letter formation w/ incorporation of visual cues, but improved w/ repetition. After  practice, OT returned to attempting free writing w/ letters again oriented toward L. Paper was repositioned at an angle toward L side and letter orientation was corrected w/ mild continued difficulty w/ alignment to bottom line. Will continue to address.            OT Short Term Goals - 01/18/21 1233       OT SHORT TERM GOAL #1   Title Pt will improve participation in manipulation of clothing fasteners as evidenced by completing 9-Hole Peg Test w/ less than 5 drops using R, dominant hand    Baseline R hand 2 min, 20 sec w/ 7 drops    Time 4    Period Weeks    Status Achieved   01/06/21 - 3 drops (1 min, 23 sec)   Target Date 01/17/21      OT SHORT TERM GOAL #2   Title Pt will improve safety w/ functional overhead reach by increasing strength of R shoulder flexion to at least 4-/5    Baseline 3+/5 (full ROM against gravity; slight resistance)    Time 4    Period Weeks    Status Achieved   01/18/21 - 4-/5   Target Date 01/17/21      OT SHORT TERM GOAL #3   Title Pt will demonstrate ability to thread first sleeve into jacket to improve initiation of task and independence w/ BADLs    Baseline Min A to initiate task    Time 4    Period Weeks    Status Achieved   01/05/20 - Mod I w/ donning jacket; assist only w/ threading zipper   Target Date 01/17/21             OT Long Term Goals - 01/18/21 1049       OT LONG TERM GOAL #1   Title Pt will demonstrate independence w/ full HEP designed for BUE strengthening, GM/FMC, and coordination by discharge    Baseline No HEP at this time    Time 8    Period Weeks    Status On-going    Target Date 02/14/21      OT LONG TERM GOAL #2   Title Pt will improve RUE GMC and coordination for improved participation in ADLs as evidenced by increasing Box and Blocks score by at least 5 blocks    Baseline 01/06/21 - 20 blocks    Time 8    Period Weeks    Status On-going   01/18/21 - 24 blocks   Target Date 02/14/21      OT LONG TERM GOAL #3    Title Pt will be able to thread zipper w/ Mod I, using AE prn, in at least 1 attempt    Baseline Unable to thread zipper    Time 8    Period Weeks    Status Achieved   01/18/21 - Mod I (extended time; no AE)   Target Date 02/14/21  OT LONG TERM GOAL #4   Title Pt will be able to complete simulated self-feeding w/ Mod I (scooping, bringing utensil to mouth, cutting food, etc.) using AE prn    Baseline Reports difficulty manipulating utensils    Time 8    Period Weeks    Status On-going   01/18/21 - reports continued difficulty only w/ cutting tough foods   Target Date 02/14/21      OT LONG TERM GOAL #5   Title Pt will be able to don socks w/ Mod I, using AE prn, in at least 1 attempt by d/c    Baseline Needs assist w/ donning socks    Time 8    Period Weeks    Status Achieved   01/18/21 - demonstrated w/ Mod I   Target Date 02/14/21      OT LONG TERM GOAL #6   Title Pt will improve R hand grip strength by at least 5 lbs to improve safety w/ IADLs and decrease object drops/spills    Baseline 01/18/21 - RUE 24 lbs (dominant side); LUE 35#    Time 8    Period Weeks    Status New    Target Date 02/14/21             Plan - 01/25/21 1111     Clinical Impression Statement OT facilitated discussion regarding participation in hair care tasks due to pt's report that she no longer wished to work on using her flat iron to style her hair contrary to report in previous session when pt stated that she would to work on this. OT assured pt that OT is happy to help if she changes her mind and determines that this is something that is important to her; pt was receptive. OT then continued to address limitations w/ bilateral coordination. Pt demonstrated difficulty completing cutting task at midline, shifting toward L side and unable to correct w/out OT facilitation and external cues, as well as difficulty writing vertically on lined paper, tilting all letters toward the L. This indicates continued  decreased awareness of R side and will continue to be assessed and addressed further in regard to functional activities.    OT Occupational Profile and History Detailed Assessment- Review of Records and additional review of physical, cognitive, psychosocial history related to current functional performance    Occupational performance deficits (Please refer to evaluation for details): ADL's;IADL's;Rest and Sleep;Leisure;Social Participation    Body Structure / Function / Physical Skills ADL;Decreased knowledge of use of DME;Gait;Strength;Balance;Dexterity;GMC;Tone;Body mechanics;Proprioception;UE functional use;IADL;Endurance;ROM;Vision;Coordination;Mobility;Sensation;FMC    Cognitive Skills Memory;Problem Solve;Safety Awareness;Sequencing    Rehab Potential Fair   Due to comorbidities   Clinical Decision Making Several treatment options, min-mod task modification necessary    Comorbidities Affecting Occupational Performance: Presence of comorbidities impacting occupational performance    Comorbidities impacting occupational performance description: Vascular dementia; anxiety    Modification or Assistance to Complete Evaluation  Min-Moderate modification of tasks or assist with assess necessary to complete eval    OT Frequency 2x / week    OT Duration 8 weeks    OT Treatment/Interventions Self-care/ADL training;Aquatic Therapy;Electrical Stimulation;Moist Heat;Cryotherapy;Therapeutic exercise;Neuromuscular education;Energy conservation;Manual Therapy;Functional Mobility Training;DME and/or AE instruction;Passive range of motion;Patient/family education;Visual/perceptual remediation/compensation;Cognitive remediation/compensation;Therapeutic activities;Splinting;Balance training    Plan Continue w/ asymmetrical bilateral coordination and GM movement involving crossing midline toward R side; grip strengthening; reassess cutting food prn   OT Home Exercise Plan MedBridge Code: [id number] - shoulder flex/ext  w/ theraband; weight shift L/R in quadruped; scapular retraction on  forearms in prone; gross grasp w/ putty; 3-pt and lateral pinch w/ putty    Recommended Other Services Currently receiving ST and PT services at this location    Consulted and Agree with Plan of Care Patient            Patient will benefit from skilled therapeutic intervention in order to improve the following deficits and impairments:   Body Structure / Function / Physical Skills: ADL, Decreased knowledge of use of DME, Gait, Strength, Balance, Dexterity, GMC, Tone, Body mechanics, Proprioception, UE functional use, IADL, Endurance, ROM, Vision, Coordination, Mobility, Sensation, [facility] Cognitive Skills: Memory, Problem Solve, Safety Awareness, Sequencing   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 [facility])  Visuospatial deficit  Other lack of coordination  Frontal lobe and executive function deficit   Problem List Patient Active Problem List   Diagnosis Date Noted   Screening for malignant neoplasm of colon 01/10/2021   Change in bowel habit 01/10/2021   Flatulence, eructation and gas pain 01/10/2021   Internal hemorrhoids 01/10/2021   Ingrowing nail, left great toe 01/10/2021   Dyslipidemia 12/29/2020   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 [place], [name], [address] Phone: [phone]   Fax:   [phone]  Name: JELICIA NANTZ MRN: [id number] Date of Birth: 02/10/1955

## 2021-01-30 ENCOUNTER — Ambulatory Visit (INDEPENDENT_AMBULATORY_CARE_PROVIDER_SITE_OTHER): Payer: Medicare HMO

## 2021-01-30 DIAGNOSIS — I63321 Cerebral infarction due to thrombosis of right anterior cerebral artery: Secondary | ICD-10-CM

## 2021-01-30 LAB — CUP PACEART REMOTE DEVICE CHECK
Date Time Interrogation Session: 20230129230619
Implantable Pulse Generator Implant Date: 20211020

## 2021-01-31 ENCOUNTER — Encounter: Payer: Self-pay | Admitting: Speech Pathology

## 2021-01-31 ENCOUNTER — Encounter: Payer: Self-pay | Admitting: Occupational Therapy

## 2021-01-31 ENCOUNTER — Other Ambulatory Visit: Payer: Self-pay

## 2021-01-31 ENCOUNTER — Ambulatory Visit: Payer: Medicare HMO | Admitting: Occupational Therapy

## 2021-01-31 ENCOUNTER — Ambulatory Visit: Payer: Medicare HMO | Admitting: Speech Pathology

## 2021-01-31 DIAGNOSIS — M6281 Muscle weakness (generalized): Secondary | ICD-10-CM | POA: Diagnosis not present

## 2021-01-31 DIAGNOSIS — R278 Other lack of coordination: Secondary | ICD-10-CM

## 2021-01-31 DIAGNOSIS — R2689 Other abnormalities of gait and mobility: Secondary | ICD-10-CM

## 2021-01-31 DIAGNOSIS — R41841 Cognitive communication deficit: Secondary | ICD-10-CM

## 2021-01-31 DIAGNOSIS — I63512 Cerebral infarction due to unspecified occlusion or stenosis of left middle cerebral artery: Secondary | ICD-10-CM | POA: Diagnosis not present

## 2021-01-31 DIAGNOSIS — R41844 Frontal lobe and executive function deficit: Secondary | ICD-10-CM

## 2021-01-31 DIAGNOSIS — R41842 Visuospatial deficit: Secondary | ICD-10-CM

## 2021-01-31 DIAGNOSIS — I6300 Cerebral infarction due to thrombosis of unspecified precerebral artery: Secondary | ICD-10-CM | POA: Diagnosis not present

## 2021-01-31 DIAGNOSIS — R262 Difficulty in walking, not elsewhere classified: Secondary | ICD-10-CM | POA: Diagnosis not present

## 2021-01-31 NOTE — Therapy (Signed)
[facility]. [place], [name], [address] Phone: [phone]   Fax:  [phone]  Speech Language Pathology Treatment  Patient Details  Name: Jacqueline Bonilla MRN: [id number] Date of Birth: 1955-08-02 Referring Provider (SLP): Dawayne Patricia   Encounter Date: 01/31/2021   End of Session - 01/31/21 1019     Visit Number 13    Number of Visits 17    Date for SLP Re-Evaluation 02/20/21    SLP Start Time 1017    SLP Stop Time  1100    SLP Time Calculation (min) 43 min    Activity Tolerance Patient tolerated treatment well             Past Medical History:  Diagnosis Date   Anemia    Anxiety    Depression    Dysmenorrhea    Endometriosis    Fibroid    Hypertension    Microhematuria    negative workup   Osteoporosis    Tachycardia    Thrombocytopenia ([place])     Past Surgical History:  Procedure Laterality Date   BREAST BIOPSY     BUBBLE STUDY  12/08/2020   Procedure: BUBBLE STUDY;  Surgeon: Josue Hector, MD;  Location: [place];  Service: Cardiovascular;;   CESAREAN SECTION     hysteroscopic resection     implantable loop recorder implant  10/21/2019   Medtronic Reveal Linq model LNQ 22 ([place] [id number]) implantable loop recorder   IR CT HEAD LTD  12/01/2020   IR PERCUTANEOUS ART THROMBECTOMY/INFUSION INTRACRANIAL INC DIAG ANGIO  12/01/2020   IR US GUIDE VASC ACCESS RIGHT  12/01/2020   RADIOLOGY WITH ANESTHESIA N/A 12/01/2020   Procedure: IR WITH ANESTHESIA - CODE STROKE;  Surgeon: Radiologist, Medication, MD;  Location: [place];  Service: Radiology;  Laterality: N/A;   TEE WITHOUT CARDIOVERSION N/A 12/08/2020   Procedure: TRANSESOPHAGEAL ECHOCARDIOGRAM (TEE);  Surgeon: Josue Hector, MD;  Location: [facility] ENDOSCOPY;  Service: Cardiovascular;  Laterality: N/A;    There were no vitals filed for this visit.   Subjective Assessment - 01/31/21 1018     Subjective "I don't know why."     Currently in Pain? No/denies                   ADULT SLP TREATMENT - 01/31/21 1217       General Information   Behavior/Cognition Alert;Cooperative;Pleasant mood      Treatment Provided   Treatment provided Cognitive-Linquistic      Cognitive-Linquistic Treatment   Treatment focused on Cognition;Patient/family/caregiver education    Skilled Treatment Edu on organization and routines to assist with keeping track of items at home. Pt reports she has difficulty telling time and SLP suggested digital clock. SLP suggested instituting a "memory corner" where she  could keep all external aids (i.e. digital clock, large print calendar, keys/phone/wallet (in a basket/bowl), a notepad. Facilitated increased reading ability on cell phone. Increased print on phone and highlighting of words when using text to speech. Also showed patient how to use the microphone in Notes to list items vs. writing items. Pt reported she feels frustrated at home with expectations and reports husband will not come in for therapy.      Assessment / Recommendations / Plan   Plan Continue with current plan of care      Progression Toward Goals   Progression toward goals Progressing toward goals  SLP Short Term Goals - 01/17/21 1446       SLP SHORT TERM GOAL #1   Title Pt will provide 3 areas of challenge for her given min verbal cues to demonstrate increased awareness.    Time 4    Period Weeks    Status Achieved    Target Date 01/17/21      SLP SHORT TERM GOAL #2   Title Pt will recall 3 memory strategies to aid recall of important information with minA.    Time 4    Period Weeks    Status Achieved    Target Date 01/17/21      SLP SHORT TERM GOAL #3   Title Pt will identify errors during structured cognitive task with modA verbal cues.    Time 4    Period Weeks    Status Not Met    Target Date 01/17/21      SLP SHORT TERM GOAL #4   Title Pt will recall 3 word finding  strategies to decrease anomia in conversation given minA.    Time 4    Period Weeks    Status Not Met    Target Date 01/17/21              SLP Long Term Goals - 12/20/20 1711       SLP LONG TERM GOAL #1   Title Pt will provide 3 areas of challenge for her independently to demonstrate increased awareness of deficits.    Time 8    Period Weeks    Status New    Target Date 02/14/21      SLP LONG TERM GOAL #2   Title Patient will demonstrate use of memory strategies to schedule activities, recall weekly events and items to maintain safety to participate socially in functional living environment    Time 8    Period Weeks    Status New    Target Date 02/14/21      SLP LONG TERM GOAL #3   Title Pt will identify errors during structured cognitive task with minA verbal cues.    Time 8    Period Weeks    Status New    Target Date 02/14/21      SLP LONG TERM GOAL #4   Title Pt will demonstrate 2-3 word finding strategies to decrease anomia in conversation independently.    Time 8    Period Weeks    Status New    Target Date 02/14/21              Plan - 01/31/21 1021     Clinical Impression Statement See tx note. SLP and patient discussed husband coming in for sessions. She reported that she "doesn't know why he won't come in but thinks it would be beneficial." Pt had difficulty recalling that she was supposed to bring her therapy folder today (after spaced retreival). She was able to recall therapists name given extra time. SLP rec skilled ST services to provide assistance with memory and attention strategies to increase independence and efficiency at home.    Speech Therapy Frequency 2x / week    Duration 8 weeks    Treatment/Interventions Compensatory strategies;Cueing hierarchy;Functional tasks;Patient/family education;Environmental controls;Cognitive reorganization;Multimodal communcation approach;Language facilitation;Compensatory techniques;Internal/external aids;SLP  instruction and feedback    Potential to Achieve Goals Fair    Potential Considerations Ability to learn/carryover information    Consulted and Agree with Plan of Care Patient  Patient will benefit from skilled therapeutic intervention in order to improve the following deficits and impairments:   Cognitive communication deficit    Problem List Patient Active Problem List   Diagnosis Date Noted   Screening for malignant neoplasm of colon 01/10/2021   Change in bowel habit 01/10/2021   Flatulence, eructation and gas pain 01/10/2021   Internal hemorrhoids 01/10/2021   Ingrowing nail, left great toe 01/10/2021   Dyslipidemia 12/29/2020   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 [place], [name], [address] Phone: [phone]   Fax:  [phone]   Name: Jacqueline Bonilla MRN: [id number] Date of Birth: Jan 06, 1955

## 2021-01-31 NOTE — Therapy (Signed)
[place] [place], [name], [address] Phone: [phone]   Fax:  [phone]  Occupational Therapy Treatment  Patient Details  Name: Jacqueline Bonilla MRN: [id number] Date of Birth: 1955-05-06 Referring Provider (OT): Lauraine Rinne, PA-C   Encounter Date: 01/31/2021   OT End of Session - 01/31/21 1104     Visit Number 13    Number of Visits 17    Date for OT Re-Evaluation 03/20/21    Authorization Type [employer]    Authorization - Visit Number 13    Authorization - Number of Visits 17    OT Start Time 1100    OT Stop Time 1145    OT Time Calculation (min) 45 min    Activity Tolerance Patient tolerated treatment well    Behavior During Therapy [facility] for tasks assessed/performed            Past Medical History:  Diagnosis Date   Anemia    Anxiety    Depression    Dysmenorrhea    Endometriosis    Fibroid    Hypertension    Microhematuria    negative workup   Osteoporosis    Tachycardia    Thrombocytopenia ([place])     Past Surgical History:  Procedure Laterality Date   BREAST BIOPSY     BUBBLE STUDY  12/08/2020   Procedure: BUBBLE STUDY;  Surgeon: Josue Hector, MD;  Location: [facility];  Service: Cardiovascular;;   CESAREAN SECTION     hysteroscopic resection     implantable loop recorder implant  10/21/2019   Medtronic Reveal Linq model LNQ 22 ([place] [id number]) implantable loop recorder   IR CT HEAD LTD  12/01/2020   IR PERCUTANEOUS ART THROMBECTOMY/INFUSION INTRACRANIAL INC DIAG ANGIO  12/01/2020   IR US GUIDE VASC ACCESS RIGHT  12/01/2020   RADIOLOGY WITH ANESTHESIA N/A 12/01/2020   Procedure: IR WITH ANESTHESIA - CODE STROKE;  Surgeon: Radiologist, Medication, MD;  Location: [facility];  Service: Radiology;  Laterality: N/A;   TEE WITHOUT CARDIOVERSION N/A 12/08/2020   Procedure: TRANSESOPHAGEAL ECHOCARDIOGRAM (TEE);  Surgeon: Josue Hector, MD;  Location: [facility] ENDOSCOPY;  Service:  Cardiovascular;  Laterality: N/A;    There were no vitals filed for this visit.   Subjective Assessment - 01/31/21 1103     Subjective  Pt states she wants to work on her writing but that it is frustrating    Pertinent History Cerebral infarction 12/01/20; MRI indicated small acute infarcts in L MCA distribution and SAH overlying bilateral cerebral hemispheres. PMH includes 2 prior strokes in June 2021 (R-sided weakness) and June 2022 (L-sided weakness); vascular dementia; HTN; loop recorder implant 10/2019; osteoporosis; thrombocytopenia; anxiety; anemia    Patient Stated Goals "Anything I can to get better"    Currently in Pain? No/denies             Treatment/Exercises - 01/31/21    Prewriting & Handwriting Tracing letters of large-print word search activity using a marker to facilitate coordination, letter formation, sizing, and alignment of letters during handwriting; pt completed activity w/ multiple deviations, decreased smoothness of lines, and occasional letter reversal/incorrect formation despite tracing  Color by number picture w/ pt instructed to color within the lines based on the color matched by number; significant difficulty coloring in the entire shape, often leaving spaces of white and was unable to correct w/ cues and breakdown of task  Tabletop icon cancellation activity w/ pt identifying 21/24 icons. Pt required extended  time for task completion and was unable to identify missed icons despite self-checking her work  Using whiteboard to facilitate letter formation w/ OT providing visual cues (square border and dots) for increased letter alignment and spacing. Success improved w/ repetition and visual cues    [place] Picking up standard dice and rotating in-hand using R fingertips only to match each number and then place on tabletop; completed 2 sets w/ 10 dice and min drops, requiring verbal/gestural cues to decrease compensating by using tabletop as stabilizer or L hand to  assist            OT Education - 01/31/21 1104     Education Details Continued condition-specific education and discussed POC w/ pt    Person(s) Educated Patient    Methods Explanation    Comprehension Verbalized understanding             OT Short Term Goals - 01/18/21 1233       OT SHORT TERM GOAL #1   Title Pt will improve participation in manipulation of clothing fasteners as evidenced by completing 9-Hole Peg Test w/ less than 5 drops using R, dominant hand    Baseline R hand 2 min, 20 sec w/ 7 drops    Time 4    Period Weeks    Status Achieved   01/06/21 - 3 drops (1 min, 23 sec)   Target Date 01/17/21      OT SHORT TERM GOAL #2   Title Pt will improve safety w/ functional overhead reach by increasing strength of R shoulder flexion to at least 4-/5    Baseline 3+/5 (full ROM against gravity; slight resistance)    Time 4    Period Weeks    Status Achieved   01/18/21 - 4-/5   Target Date 01/17/21      OT SHORT TERM GOAL #3   Title Pt will demonstrate ability to thread first sleeve into jacket to improve initiation of task and independence w/ BADLs    Baseline Min A to initiate task    Time 4    Period Weeks    Status Achieved   01/05/20 - Mod I w/ donning jacket; assist only w/ threading zipper   Target Date 01/17/21             OT Long Term Goals - 01/18/21 1049       OT LONG TERM GOAL #1   Title Pt will demonstrate independence w/ full HEP designed for BUE strengthening, GM/FMC, and coordination by discharge    Baseline No HEP at this time    Time 8    Period Weeks    Status On-going    Target Date 02/14/21      OT LONG TERM GOAL #2   Title Pt will improve RUE GMC and coordination for improved participation in ADLs as evidenced by increasing Box and Blocks score by at least 5 blocks    Baseline 01/06/21 - 20 blocks    Time 8    Period Weeks    Status On-going   01/18/21 - 24 blocks   Target Date 02/14/21      OT LONG TERM GOAL #3   Title Pt will be  able to thread zipper w/ Mod I, using AE prn, in at least 1 attempt    Baseline Unable to thread zipper    Time 8    Period Weeks    Status Achieved   01/18/21 - Mod I (extended time; no  AE)   Target Date 02/14/21      OT LONG TERM GOAL #4   Title Pt will be able to complete simulated self-feeding w/ Mod I (scooping, bringing utensil to mouth, cutting food, etc.) using AE prn    Baseline Reports difficulty manipulating utensils    Time 8    Period Weeks    Status On-going   01/18/21 - reports continued difficulty only w/ cutting tough foods   Target Date 02/14/21      OT LONG TERM GOAL #5   Title Pt will be able to don socks w/ Mod I, using AE prn, in at least 1 attempt by d/c    Baseline Needs assist w/ donning socks    Time 8    Period Weeks    Status Achieved   01/18/21 - demonstrated w/ Mod I   Target Date 02/14/21      OT LONG TERM GOAL #6   Title Pt will improve R hand grip strength by at least 5 lbs to improve safety w/ IADLs and decrease object drops/spills    Baseline 01/18/21 - RUE 24 lbs (dominant side); LUE 35#    Time 8    Period Weeks    Status New    Target Date 02/14/21             Plan - 01/31/21 1105     Clinical Impression Statement OT initiated session addressing handwriting due to pt's report that she is trying to write at home and it continues to be difficult for her. To incorporate coordination and visual perception, pt traced letters of large-print word search. Due to observed difficulty w/ letter formation despite tracing, OT attempted to address more targeted visual perception via color-by-number task. Pt again demonstrated significant difficulty w/ this, so OT graded task to visual scanning w/ pt missing targets by line indicating difficulty w/ organized search/scan pattern. Pt demonstrated increased confusion and less energy this session, but participated well in session well and continues to be motivated in therapy.    OT Occupational Profile and  History Detailed Assessment- Review of Records and additional review of physical, cognitive, psychosocial history related to current functional performance    Occupational performance deficits (Please refer to evaluation for details): ADL's;IADL's;Rest and Sleep;Leisure;Social Participation    Body Structure / Function / Physical Skills ADL;Decreased knowledge of use of DME;Gait;Strength;Balance;Dexterity;GMC;Tone;Body mechanics;Proprioception;UE functional use;IADL;Endurance;ROM;Vision;Coordination;Mobility;Sensation;FMC    Cognitive Skills Memory;Problem Solve;Safety Awareness;Sequencing    Rehab Potential Fair   Due to comorbidities   Clinical Decision Making Several treatment options, min-mod task modification necessary    Comorbidities Affecting Occupational Performance: Presence of comorbidities impacting occupational performance    Comorbidities impacting occupational performance description: Vascular dementia; anxiety    Modification or Assistance to Complete Evaluation  Min-Moderate modification of tasks or assist with assess necessary to complete eval    OT Frequency 2x / week    OT Duration 8 weeks    OT Treatment/Interventions Self-care/ADL training;Aquatic Therapy;Electrical Stimulation;Moist Heat;Cryotherapy;Therapeutic exercise;Neuromuscular education;Energy conservation;Manual Therapy;Functional Mobility Training;DME and/or AE instruction;Passive range of motion;Patient/family education;Visual/perceptual remediation/compensation;Cognitive remediation/compensation;Therapeutic activities;Splinting;Balance training    Plan Continue w/ asymmetrical bilateral coordination and GM movement involving crossing midline toward R side; grip strengthening; reassess cutting food prn    OT Home Exercise Plan MedBridge Code: [email or web address] - shoulder flex/ext w/ theraband; weight shift L/R in quadruped; scapular retraction on forearms in prone; gross grasp w/ putty; 3-pt and lateral pinch w/ putty     Recommended Other Services Currently receiving ST and  PT services at this location    Consulted and Agree with Plan of Care Patient            Patient will benefit from skilled therapeutic intervention in order to improve the following deficits and impairments:   Body Structure / Function / Physical Skills: ADL, Decreased knowledge of use of DME, Gait, Strength, Balance, Dexterity, GMC, Tone, Body mechanics, Proprioception, UE functional use, IADL, Endurance, ROM, Vision, Coordination, Mobility, Sensation, [facility] Cognitive Skills: Memory, Problem Solve, Safety Awareness, Sequencing   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 [facility])  Other lack of coordination  Muscle weakness (generalized)  Frontal lobe and executive function deficit  Visuospatial deficit  Cognitive communication deficit  Other abnormalities of gait and mobility   Problem List Patient Active Problem List   Diagnosis Date Noted   Screening for malignant neoplasm of colon 01/10/2021   Change in bowel habit 01/10/2021   Flatulence, eructation and gas pain 01/10/2021   Internal hemorrhoids 01/10/2021   Ingrowing nail, left great toe 01/10/2021   Dyslipidemia 12/29/2020   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) 07/15/2019   Cerebrovascular accident ([place]) 06/25/2019   History of COVID-19 04/22/2019   Essential hypertension 03/03/2019   Hypertensive urgency 03/03/2019    Kathrine Cords, MSOT, OTR/L 01/31/2021, 12:06 PM  [place] [place], [name], [address] Phone: [phone]   Fax:  [phone]  Name: LASHEKA KEMPNER MRN:  [id number] Date of Birth: July 02, 1955

## 2021-02-02 ENCOUNTER — Encounter: Payer: Self-pay | Admitting: Physical Therapy

## 2021-02-02 ENCOUNTER — Other Ambulatory Visit: Payer: Self-pay

## 2021-02-02 ENCOUNTER — Encounter: Payer: Self-pay | Admitting: Occupational Therapy

## 2021-02-02 ENCOUNTER — Ambulatory Visit: Payer: Medicare HMO | Attending: Physician Assistant | Admitting: Occupational Therapy

## 2021-02-02 ENCOUNTER — Ambulatory Visit: Payer: Medicare HMO | Admitting: Speech Pathology

## 2021-02-02 ENCOUNTER — Ambulatory Visit: Payer: Medicare HMO | Admitting: Physical Therapy

## 2021-02-02 ENCOUNTER — Encounter: Payer: Self-pay | Admitting: Speech Pathology

## 2021-02-02 DIAGNOSIS — I63512 Cerebral infarction due to unspecified occlusion or stenosis of left middle cerebral artery: Secondary | ICD-10-CM

## 2021-02-02 DIAGNOSIS — R41844 Frontal lobe and executive function deficit: Secondary | ICD-10-CM | POA: Diagnosis not present

## 2021-02-02 DIAGNOSIS — R41842 Visuospatial deficit: Secondary | ICD-10-CM | POA: Insufficient documentation

## 2021-02-02 DIAGNOSIS — R278 Other lack of coordination: Secondary | ICD-10-CM

## 2021-02-02 DIAGNOSIS — R262 Difficulty in walking, not elsewhere classified: Secondary | ICD-10-CM | POA: Insufficient documentation

## 2021-02-02 DIAGNOSIS — M6281 Muscle weakness (generalized): Secondary | ICD-10-CM

## 2021-02-02 DIAGNOSIS — R41841 Cognitive communication deficit: Secondary | ICD-10-CM | POA: Diagnosis not present

## 2021-02-02 DIAGNOSIS — R2689 Other abnormalities of gait and mobility: Secondary | ICD-10-CM | POA: Insufficient documentation

## 2021-02-02 NOTE — Therapy (Signed)
[place] [place], [name], [address] Phone: [phone]   Fax:  [phone]  Physical Therapy Treatment PHYSICAL THERAPY DISCHARGE SUMMARY  Visits from Start of Care: 02/02/21  Current functional level related to goals / functional outcomes: All goals met. See functional assessment   Remaining deficits: Impulsive with quick movements, poor memory increase fall risk.   Education / Equipment: HEP   Patient agrees to discharge. Patient goals were met. Patient is being discharged due to meeting the stated rehab goals.  Patient Details  Name: Jacqueline Bonilla MRN: [id number] Date of Birth: March 20, 1955 Referring Provider (PT): Wittmann   Encounter Date: 02/02/2021   PT End of Session - 02/02/21 1214     Visit Number 11    Number of Visits 12    PT Start Time 7371    PT Stop Time 1212    PT Time Calculation (min) 27 min    Activity Tolerance Patient tolerated treatment well    Behavior During Therapy WFL for tasks assessed/performed             Past Medical History:  Diagnosis Date   Anemia    Anxiety    Depression    Dysmenorrhea    Endometriosis    Fibroid    Hypertension    Microhematuria    negative workup   Osteoporosis    Tachycardia    Thrombocytopenia ([place])     Past Surgical History:  Procedure Laterality Date   BREAST BIOPSY     BUBBLE STUDY  12/08/2020   Procedure: BUBBLE STUDY;  Surgeon: Josue Hector, MD;  Location: [facility];  Service: Cardiovascular;;   CESAREAN SECTION     hysteroscopic resection     implantable loop recorder implant  10/21/2019   Medtronic Reveal Linq model LNQ 22 ([place] [id number]) implantable loop recorder   IR CT HEAD LTD  12/01/2020   IR PERCUTANEOUS ART THROMBECTOMY/INFUSION INTRACRANIAL INC DIAG ANGIO  12/01/2020   IR US GUIDE VASC ACCESS RIGHT  12/01/2020   RADIOLOGY WITH ANESTHESIA N/A 12/01/2020   Procedure: IR WITH ANESTHESIA - CODE STROKE;   Surgeon: Radiologist, Medication, MD;  Location: [facility];  Service: Radiology;  Laterality: N/A;   TEE WITHOUT CARDIOVERSION N/A 12/08/2020   Procedure: TRANSESOPHAGEAL ECHOCARDIOGRAM (TEE);  Surgeon: Josue Hector, MD;  Location: [facility] ENDOSCOPY;  Service: Cardiovascular;  Laterality: N/A;    There were no vitals filed for this visit.       [facility] PT Assessment - 02/02/21 0001       Berg Balance Test   Sit to Stand Able to stand without using hands and stabilize independently    Standing Unsupported Able to stand safely 2 minutes    Sitting with Back Unsupported but Feet Supported on Floor or Stool Able to sit safely and securely 2 minutes    Stand to Sit Sits safely with minimal use of hands    Transfers Able to transfer safely, minor use of hands    Standing Unsupported with Eyes Closed Able to stand 10 seconds safely    Standing Unsupported with Feet Together Able to place feet together independently and stand 1 minute safely    From Standing, Reach Forward with Outstretched Arm Can reach confidently >25 cm (10")    From Standing Position, Pick up Object from Floor Able to pick up shoe safely and easily    From Standing Position, Turn to Look Behind Over each Shoulder Looks behind from  both sides and weight shifts well    Turn 360 Degrees Able to turn 360 degrees safely but slowly    Standing Unsupported, Alternately Place Feet on Step/Stool Able to stand independently and safely and complete 8 steps in 20 seconds    Standing Unsupported, One Foot in Front Able to plae foot ahead of the other independently and hold 30 seconds    Standing on One Leg Able to lift leg independently and hold 5-10 seconds    Total Score 52                                      PT Short Term Goals - 02/02/21 1218       PT SHORT TERM GOAL #1   Title Pt will be I with initial HEP    Time 1    Period Weeks    Status Achieved    Target Date 01/18/21               PT  Long Term Goals - 02/02/21 1142       PT LONG TERM GOAL #1   Title Pt will demo improved Berg Balance score to 52/56 to demo decreased fall risk    Baseline 52/56    Time 2    Period Weeks    Status Achieved    Target Date 02/08/21      PT LONG TERM GOAL #2   Title Pt will demo 5x STS <15 seconds with no UE assist    Baseline 13.47 seconds with occasional UE assist and LEs intermittently locking on back of chair    Time 2    Period Weeks    Status Achieved    Target Date 02/08/21      PT LONG TERM GOAL #3   Title Pt will demo gait >1000 ft over level/unlevel surfaces with no AD and supervision assist    Baseline Walks in the neighborhood about 1/2 m-69m   Status Achieved      PT LONG TERM GOAL #4   Title Pt will report [facility]    Status Achieved      PT LONG TERM GOAL #5   Title incresae LE strength to 4+/5    Baseline 4+/5    Time 2    Period Weeks    Status Achieved    Target Date 02/08/21                   Plan - 02/02/21 1124     Clinical Impression Statement Patient reports she feels prepared for D/C form PT. [facility] and has HEP for balance training. Therapist encouraged [facility] to [facility] and educated her to exercise groups available for her to tap in to as well. She met her LTG. She does continue to have a risk of falling due to her attention deficits, poor memory, and her tendency to move very quickly.    Personal Factors and Comorbidities Comorbidity 2    Comorbidities hx of CVA, HTN    Examination-Activity Limitations Stand;Locomotion Level    Examination-Participation Restrictions Community Activity;Interpersonal Relationship    Stability/Clinical Decision Making Evolving/Moderate complexity    Rehab Potential Good    PT Frequency 2x / week  PT Duration 2 weeks    PT Treatment/Interventions  ADLs/Self Care Home Management;Gait training;Stair training;Functional mobility training;Therapeutic activities;Therapeutic exercise;Balance training;Neuromuscular re-education;Manual techniques;Patient/family education    PT Next Visit Plan --    PT Home Exercise Plan Access Code:  [email or web address]    Consulted and Agree with Plan of Care Patient;Other (Comment)             Patient will benefit from skilled therapeutic intervention in order to improve the following deficits and impairments:  Difficulty walking, Abnormal gait, Impaired UE functional use, Decreased endurance, Cardiopulmonary status limiting activity, Decreased activity tolerance, Impaired vision/preception, Decreased balance, Postural dysfunction, Decreased strength, Decreased mobility  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 [place], [name], [address] Phone: [phone]   Fax:  [phone]  Name: BETH GOODLIN MRN: [id number] Date of Birth: May 16, 1955

## 2021-02-02 NOTE — Therapy (Signed)
[place]. [place], [name], [address] Phone: [phone]   Fax:  [phone]  Speech Language Pathology Treatment  Patient Details  Name: Jacqueline Bonilla MRN: [id number] Date of Birth: [birth year] Referring Provider (SLP): Dawayne Patricia   Encounter Date: 02/02/2021   End of Session - 02/02/21 1025     Visit Number 14    Number of Visits 17    Date for SLP Re-Evaluation 02/20/21    SLP Start Time 20    SLP Stop Time  1059    SLP Time Calculation (min) 40 min    Activity Tolerance Patient tolerated treatment well             Past Medical History:  Diagnosis Date   Anemia    Anxiety    Depression    Dysmenorrhea    Endometriosis    Fibroid    Hypertension    Microhematuria    negative workup   Osteoporosis    Tachycardia    Thrombocytopenia ([place])     Past Surgical History:  Procedure Laterality Date   BREAST BIOPSY     BUBBLE STUDY  12/08/2020   Procedure: BUBBLE STUDY;  Surgeon: Josue Hector, MD;  Location: [facility];  Service: Cardiovascular;;   CESAREAN SECTION     hysteroscopic resection     implantable loop recorder implant  10/21/2019   Medtronic Reveal Linq model LNQ 22 ([place] [id number]) implantable loop recorder   IR CT HEAD LTD  12/01/2020   IR PERCUTANEOUS ART THROMBECTOMY/INFUSION INTRACRANIAL INC DIAG ANGIO  12/01/2020   IR US GUIDE VASC ACCESS RIGHT  12/01/2020   RADIOLOGY WITH ANESTHESIA N/A 12/01/2020   Procedure: IR WITH ANESTHESIA - CODE STROKE;  Surgeon: Radiologist, Medication, MD;  Location: [facility];  Service: Radiology;  Laterality: N/A;   TEE WITHOUT CARDIOVERSION N/A 12/08/2020   Procedure: TRANSESOPHAGEAL ECHOCARDIOGRAM (TEE);  Surgeon: Josue Hector, MD;  Location: [facility] ENDOSCOPY;  Service: Cardiovascular;  Laterality: N/A;    There were no vitals filed for this visit.   Subjective Assessment - 02/02/21 1025     Subjective "I feel like I've gotten  better."    Currently in Pain? No/denies                   ADULT SLP TREATMENT - 02/02/21 1057       General Information   Behavior/Cognition Alert;Cooperative;Pleasant mood      Treatment Provided   Treatment provided Cognitive-Linquistic      Cognitive-Linquistic Treatment   Treatment focused on Cognition;Patient/family/caregiver education    Skilled Treatment Facilitated increased safety by targeting error awareness. Strategies encouraged re: slowing down, double-checking important details. Pt required minA to complete error awareness task. Pt was able to recall 3 memory strategies to assist at home re: making lists, repeating/asking for clarification, and making connections (i.e. [place] with [place] (association)). We also reviewed completing a memory corner and using spaced retrieval to recall important information. Discussed d/c. Pt in agreement next session.      Assessment / Recommendations / Plan   Plan Continue with current plan of care      Progression Toward Goals   Progression toward goals Progressing toward goals                SLP Short Term Goals - 01/17/21 1446       SLP SHORT TERM GOAL #1   Title Pt will provide 3 areas of challenge for  her given min verbal cues to demonstrate increased awareness.    Time 4    Period Weeks    Status Achieved    Target Date 01/17/21      SLP SHORT TERM GOAL #2   Title Pt will recall 3 memory strategies to aid recall of important information with minA.    Time 4    Period Weeks    Status Achieved    Target Date 01/17/21      SLP SHORT TERM GOAL #3   Title Pt will identify errors during structured cognitive task with modA verbal cues.    Time 4    Period Weeks    Status Not Met    Target Date 01/17/21      SLP SHORT TERM GOAL #4   Title Pt will recall 3 word finding strategies to decrease anomia in conversation given minA.    Time 4    Period Weeks    Status Not Met    Target Date 01/17/21               SLP Long Term Goals - 12/20/20 1711       SLP LONG TERM GOAL #1   Title Pt will provide 3 areas of challenge for her independently to demonstrate increased awareness of deficits.    Time 8    Period Weeks    Status New    Target Date 02/14/21      SLP LONG TERM GOAL #2   Title Patient will demonstrate use of memory strategies to schedule activities, recall weekly events and items to maintain safety to participate socially in functional living environment    Time 8    Period Weeks    Status New    Target Date 02/14/21      SLP LONG TERM GOAL #3   Title Pt will identify errors during structured cognitive task with minA verbal cues.    Time 8    Period Weeks    Status New    Target Date 02/14/21      SLP LONG TERM GOAL #4   Title Pt will demonstrate 2-3 word finding strategies to decrease anomia in conversation independently.    Time 8    Period Weeks    Status New    Target Date 02/14/21              Plan - 02/02/21 1056     Clinical Impression Statement See tx note. Pt to d/c next session from [place]. Pt in agreement.    Speech Therapy Frequency 2x / week    Duration 8 weeks    Treatment/Interventions Compensatory strategies;Cueing hierarchy;Functional tasks;Patient/family education;Environmental controls;Cognitive reorganization;Multimodal communcation approach;Language facilitation;Compensatory techniques;Internal/external aids;SLP instruction and feedback    Potential to Achieve Goals Fair    Potential Considerations Ability to learn/carryover information    Consulted and Agree with Plan of Care Patient             Patient will benefit from skilled therapeutic intervention in order to improve the following deficits and impairments:   Cognitive communication deficit    Problem List Patient Active Problem List   Diagnosis Date Noted   Screening for malignant neoplasm of colon 01/10/2021   Change in bowel habit 01/10/2021   Flatulence,  eructation and gas pain 01/10/2021   Internal hemorrhoids 01/10/2021   Ingrowing nail, left great toe 01/10/2021   Dyslipidemia 12/29/2020   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 [place], [name], [address] Phone: [phone]   Fax:  [phone]   Name: Jacqueline Bonilla MRN: [id number] Date of Birth: 1955/01/30

## 2021-02-02 NOTE — Therapy (Signed)
[facility]. [place], [name], [address] Phone: [phone]   Fax:  [phone]  Occupational Therapy Treatment  Patient Details  Name: Jacqueline Bonilla MRN: [id number] Date of Birth: 1955/08/10 Referring Provider (OT): Lauraine Rinne, PA-C   Encounter Date: 02/02/2021   OT End of Session - 02/02/21 1102     Visit Number 14    Number of Visits 17    Date for OT Re-Evaluation 03/20/21    Authorization Type [employer]    Authorization - Visit Number 14    Authorization - Number of Visits 17    OT Start Time 1058    OT Stop Time 1140    OT Time Calculation (min) 42 min    Activity Tolerance Patient tolerated treatment well    Behavior During Therapy WFL for tasks assessed/performed            Past Medical History:  Diagnosis Date   Anemia    Anxiety    Depression    Dysmenorrhea    Endometriosis    Fibroid    Hypertension    Microhematuria    negative workup   Osteoporosis    Tachycardia    Thrombocytopenia ([place])     Past Surgical History:  Procedure Laterality Date   BREAST BIOPSY     BUBBLE STUDY  12/08/2020   Procedure: BUBBLE STUDY;  Surgeon: Josue Hector, MD;  Location: [facility];  Service: Cardiovascular;;   CESAREAN SECTION     hysteroscopic resection     implantable loop recorder implant  10/21/2019   Medtronic Reveal Linq model LNQ 22 ([place] [id number]) implantable loop recorder   IR CT HEAD LTD  12/01/2020   IR PERCUTANEOUS ART THROMBECTOMY/INFUSION INTRACRANIAL INC DIAG ANGIO  12/01/2020   IR US GUIDE VASC ACCESS RIGHT  12/01/2020   RADIOLOGY WITH ANESTHESIA N/A 12/01/2020   Procedure: IR WITH ANESTHESIA - CODE STROKE;  Surgeon: Radiologist, Medication, MD;  Location: [facility];  Service: Radiology;  Laterality: N/A;   TEE WITHOUT CARDIOVERSION N/A 12/08/2020   Procedure: TRANSESOPHAGEAL ECHOCARDIOGRAM (TEE);  Surgeon: Josue Hector, MD;  Location: [facility] ENDOSCOPY;  Service:  Cardiovascular;  Laterality: N/A;    There were no vitals filed for this visit.   Subjective Assessment - 02/02/21 1100     Subjective  Pt reports she went to the chiropractor today and got some xrays done; states the chiropractor thinks falling on her shoulder may be causing some of the "droopiness"    Pertinent History Cerebral infarction 12/01/20; MRI indicated small acute infarcts in L MCA distribution and SAH overlying bilateral cerebral hemispheres. PMH includes 2 prior strokes in June 2021 (R-sided weakness) and June 2022 (L-sided weakness); vascular dementia; HTN; loop recorder implant 10/2019; osteoporosis; thrombocytopenia; anxiety; anemia    Patient Stated Goals "Anything I can to get better"    Currently in Pain? No/denies             Treatment/Exercises - 02/02/21    Weight Bearing & Pegboard Weight shifting to R side while sitting EOM and wt bearing through RUE forearm/elbow w/ disengaging of contralateral LUE for cross-body reaching to retrieve easy-grip pegs from low level stepstool and return to midline to copy pattern onto large pegboard on tabletop surface. Pt required initial modeling, breakdown of task, and facilitation of alignment/positioning of RUE while wt bearing as well as verbal cues to incorporate LUE for cross-body reaching; understanding and self-correction improved w/ repetition. Copying pattern onto pegboard  w/ easy-grip pegs to facilitate [facility] and coordination, alternating attention, hand strengthening, executive functioning, and visual perception/visuospatial relationships; pt able to complete activity w/ Mod A for spatial orientation and correction of errors.    Marbles Picking up 2 marbles one at a time, translating each to fingertips, and placing on easy-grip pegs to facilitate in-hand manipulation, [place], and intrinsic hand strengthening. Completed w/ frequent drops and cues to prevent/correct compensatory patterns. To return marbles to container, pt picked up  4 marbles at a time and returned all to container w/out incorporating translation.             OT Short Term Goals - 01/18/21 1233       OT SHORT TERM GOAL #1   Title Pt will improve participation in manipulation of clothing fasteners as evidenced by completing 9-Hole Peg Test w/ less than 5 drops using R, dominant hand    Baseline R hand 2 min, 20 sec w/ 7 drops    Time 4    Period Weeks    Status Achieved   01/06/21 - 3 drops (1 min, 23 sec)   Target Date 01/17/21      OT SHORT TERM GOAL #2   Title Pt will improve safety w/ functional overhead reach by increasing strength of R shoulder flexion to at least 4-/5    Baseline 3+/5 (full ROM against gravity; slight resistance)    Time 4    Period Weeks    Status Achieved   01/18/21 - 4-/5   Target Date 01/17/21      OT SHORT TERM GOAL #3   Title Pt will demonstrate ability to thread first sleeve into jacket to improve initiation of task and independence w/ BADLs    Baseline Min A to initiate task    Time 4    Period Weeks    Status Achieved   01/05/20 - Mod I w/ donning jacket; assist only w/ threading zipper   Target Date 01/17/21             OT Long Term Goals - 02/02/21 1142       OT LONG TERM GOAL #1   Title Pt will demonstrate independence w/ full HEP designed for BUE strengthening, GM/FMC, and coordination by discharge    Baseline No HEP at this time    Time 8    Period Weeks    Status On-going    Target Date 02/14/21      OT LONG TERM GOAL #2   Title Pt will improve RUE GMC and coordination for improved participation in ADLs as evidenced by increasing Box and Blocks score by at least 5 blocks    Baseline 01/06/21 - 20 blocks    Time 8    Period Weeks    Status On-going   01/18/21 - 24 blocks   Target Date 02/14/21      OT LONG TERM GOAL #3   Title Pt will be able to thread zipper w/ Mod I, using AE prn, in at least 1 attempt    Baseline Unable to thread zipper    Time 8    Period Weeks    Status Achieved    01/18/21 - Mod I (extended time; no AE)   Target Date 02/14/21      OT LONG TERM GOAL #4   Title Pt will be able to complete simulated self-feeding w/ Mod I (scooping, bringing utensil to mouth, cutting food, etc.) using AE prn    Baseline Reports  difficulty manipulating utensils    Time 8    Period Weeks    Status On-going   01/18/21 - reports continued difficulty only w/ cutting tough foods   Target Date 02/14/21      OT LONG TERM GOAL #5   Title Pt will be able to don socks w/ Mod I, using AE prn, in at least 1 attempt by d/c    Baseline Needs assist w/ donning socks    Time 8    Period Weeks    Status Achieved   01/18/21 - demonstrated w/ Mod I   Target Date 02/14/21      OT LONG TERM GOAL #6   Title Pt will improve R hand grip strength by at least 5 lbs to improve safety w/ IADLs and decrease object drops/spills    Baseline 01/18/21 - RUE 24 lbs (dominant side); LUE 35#    Time 8    Period Weeks    Status On-going    Target Date 02/14/21             Plan - 02/02/21 1115     Clinical Impression Statement OT discussed potential for d/c next session, assuring pt that reassessment of progress toward functional goals will be completed and POC will be discussed/updated at that time; pt was agreeable. OT then continued to address NMR of RUE, incorporating weight bearing w/ body on arm movements as well as pushing through RUE to return to midline (vs relying on core engagement) to increase weight and proprioceptive input over affected RUE and increase kinesthetic sense and somatosensory feedback. Copying pattern to pegboard also incorporated into activity to continue to address visual perception and executive functioning. Pt continues to demonstrate significant difficulty w/ visuospatial relationships and correction of errors, requiring breakdown of task, extended time for processing, and incorporation of visual aid to facilitate success. Higher level FM skills (translation, rotation,  etc.) continue to be limited.    OT Occupational Profile and History Detailed Assessment- Review of Records and additional review of physical, cognitive, psychosocial history related to current functional performance    Occupational performance deficits (Please refer to evaluation for details): ADL's;IADL's;Rest and Sleep;Leisure;Social Participation    Body Structure / Function / Physical Skills ADL;Decreased knowledge of use of DME;Gait;Strength;Balance;Dexterity;GMC;Tone;Body mechanics;Proprioception;UE functional use;IADL;Endurance;ROM;Vision;Coordination;Mobility;Sensation;FMC    Cognitive Skills Memory;Problem Solve;Safety Awareness;Sequencing    Rehab Potential Fair   Due to comorbidities   Clinical Decision Making Several treatment options, min-mod task modification necessary    Comorbidities Affecting Occupational Performance: Presence of comorbidities impacting occupational performance    Comorbidities impacting occupational performance description: Vascular dementia; anxiety    Modification or Assistance to Complete Evaluation  Min-Moderate modification of tasks or assist with assess necessary to complete eval    OT Frequency 2x / week    OT Duration 8 weeks    OT Treatment/Interventions Self-care/ADL training;Aquatic Therapy;Electrical Stimulation;Moist Heat;Cryotherapy;Therapeutic exercise;Neuromuscular education;Energy conservation;Manual Therapy;Functional Mobility Training;DME and/or AE instruction;Passive range of motion;Patient/family education;Visual/perceptual remediation/compensation;Cognitive remediation/compensation;Therapeutic activities;Splinting;Balance training    Plan Reassess progress toward goals    OT Home Exercise Plan MedBridge Code: [id number] - shoulder flex/ext w/ theraband; weight shift L/R in quadruped; scapular retraction on forearms in prone; gross grasp w/ putty; 3-pt and lateral pinch w/ putty    Recommended Other Services Currently receiving ST and PT services  at this location    Consulted and Agree with Plan of Care Patient            Patient will benefit from skilled therapeutic intervention  in order to improve the following deficits and impairments:   Body Structure / Function / Physical Skills: ADL, Decreased knowledge of use of DME, Gait, Strength, Balance, Dexterity, GMC, Tone, Body mechanics, Proprioception, UE functional use, IADL, Endurance, ROM, Vision, Coordination, Mobility, Sensation, [facility] Cognitive Skills: Memory, Problem Solve, Safety Awareness, Sequencing   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 [facility])  Other lack of coordination  Visuospatial deficit  Muscle weakness (generalized)  Frontal lobe and executive function deficit  Other abnormalities of gait and mobility   Problem List Patient Active Problem List   Diagnosis Date Noted   Screening for malignant neoplasm of colon 01/10/2021   Change in bowel habit 01/10/2021   Flatulence, eructation and gas pain 01/10/2021   Internal hemorrhoids 01/10/2021   Ingrowing nail, left great toe 01/10/2021   Dyslipidemia 12/29/2020   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 [place], [name], [address] Phone: [phone]   Fax:  [phone]  Name: ALEAYAH CHICO MRN: [id number] Date of Birth: 02/16/1955

## 2021-02-03 ENCOUNTER — Ambulatory Visit: Payer: Medicare HMO | Admitting: Physical Therapy

## 2021-02-06 NOTE — Progress Notes (Signed)
Carelink Summary Report / Loop Recorder 

## 2021-02-07 ENCOUNTER — Ambulatory Visit: Payer: Medicare HMO | Admitting: Speech Pathology

## 2021-02-07 ENCOUNTER — Encounter: Payer: Medicare HMO | Admitting: Physical Therapy

## 2021-02-07 ENCOUNTER — Encounter: Payer: Self-pay | Admitting: Occupational Therapy

## 2021-02-07 ENCOUNTER — Encounter: Payer: Self-pay | Admitting: Speech Pathology

## 2021-02-07 ENCOUNTER — Ambulatory Visit: Payer: Medicare HMO | Admitting: Occupational Therapy

## 2021-02-07 ENCOUNTER — Other Ambulatory Visit: Payer: Self-pay

## 2021-02-07 DIAGNOSIS — I63512 Cerebral infarction due to unspecified occlusion or stenosis of left middle cerebral artery: Secondary | ICD-10-CM

## 2021-02-07 DIAGNOSIS — M6281 Muscle weakness (generalized): Secondary | ICD-10-CM

## 2021-02-07 DIAGNOSIS — R2689 Other abnormalities of gait and mobility: Secondary | ICD-10-CM | POA: Diagnosis not present

## 2021-02-07 DIAGNOSIS — R278 Other lack of coordination: Secondary | ICD-10-CM

## 2021-02-07 DIAGNOSIS — R41844 Frontal lobe and executive function deficit: Secondary | ICD-10-CM | POA: Diagnosis not present

## 2021-02-07 DIAGNOSIS — R41842 Visuospatial deficit: Secondary | ICD-10-CM | POA: Diagnosis not present

## 2021-02-07 DIAGNOSIS — R41841 Cognitive communication deficit: Secondary | ICD-10-CM

## 2021-02-07 DIAGNOSIS — R262 Difficulty in walking, not elsewhere classified: Secondary | ICD-10-CM | POA: Diagnosis not present

## 2021-02-08 DIAGNOSIS — M9903 Segmental and somatic dysfunction of lumbar region: Secondary | ICD-10-CM | POA: Diagnosis not present

## 2021-02-08 DIAGNOSIS — M9902 Segmental and somatic dysfunction of thoracic region: Secondary | ICD-10-CM | POA: Diagnosis not present

## 2021-02-08 DIAGNOSIS — M9904 Segmental and somatic dysfunction of sacral region: Secondary | ICD-10-CM | POA: Diagnosis not present

## 2021-02-08 DIAGNOSIS — M9901 Segmental and somatic dysfunction of cervical region: Secondary | ICD-10-CM | POA: Diagnosis not present

## 2021-02-08 NOTE — Therapy (Signed)
[facility] [place], [name], [address] Phone: [phone]   Fax:  [phone]  Occupational Therapy Treatment & Discharge Summary  Patient Details  Name: Jacqueline Bonilla MRN: [id number] Date of Birth: 1955-09-19 Referring Provider (OT): Lauraine Rinne, PA-C   Encounter Date: 02/07/2021   OT End of Session - 02/07/21 1021     Visit Number 15    Number of Visits 17    Date for OT Re-Evaluation 03/20/21    Authorization Type [employer]    Authorization - Visit Number 15    Authorization - Number of Visits 17    OT Start Time 3794    OT Stop Time 1058    OT Time Calculation (min) 40 min    Activity Tolerance Patient tolerated treatment well    Behavior During Therapy WFL for tasks assessed/performed            OCCUPATIONAL THERAPY DISCHARGE SUMMARY  Visits from Start of Care: 15  Current functional level related to goals / functional outcomes: Pt is able to complete dressing w/ Mod I, including clothing fasteners, and improved FMC, UE strength, grip strength, and GM coordination   Remaining deficits: Decreased problem-solving, visual perceptual deficits, R hand sensory impairments, decreased FM and GM control and coordination   Education / Equipment: HEP, condition-specific education   Patient agrees to discharge. Patient goals were met. Patient is being discharged due to maximized rehab potential.     Past Medical History:  Diagnosis Date   Anemia    Anxiety    Depression    Dysmenorrhea    Endometriosis    Fibroid    Hypertension    Microhematuria    negative workup   Osteoporosis    Tachycardia    Thrombocytopenia ([place])     Past Surgical History:  Procedure Laterality Date   BREAST BIOPSY     BUBBLE STUDY  12/08/2020   Procedure: BUBBLE STUDY;  Surgeon: Josue Hector, MD;  Location: [facility];  Service: Cardiovascular;;   CESAREAN SECTION     hysteroscopic resection      implantable loop recorder implant  10/21/2019   Medtronic Reveal Linq model LNQ 22 ([place] [id number]) implantable loop recorder   IR CT HEAD LTD  12/01/2020   IR PERCUTANEOUS ART THROMBECTOMY/INFUSION INTRACRANIAL INC DIAG ANGIO  12/01/2020   IR US GUIDE VASC ACCESS RIGHT  12/01/2020   RADIOLOGY WITH ANESTHESIA N/A 12/01/2020   Procedure: IR WITH ANESTHESIA - CODE STROKE;  Surgeon: Radiologist, Medication, MD;  Location: [facility];  Service: Radiology;  Laterality: N/A;   TEE WITHOUT CARDIOVERSION N/A 12/08/2020   Procedure: TRANSESOPHAGEAL ECHOCARDIOGRAM (TEE);  Surgeon: Josue Hector, MD;  Location: [facility] ENDOSCOPY;  Service: Cardiovascular;  Laterality: N/A;    There were no vitals filed for this visit.   Subjective Assessment - 02/07/21 1020     Subjective  Pt affirms she feels comfortable w/ provided HEP at this time and denies further functional concerns that she would like to address    Pertinent History Cerebral infarction 12/01/20; MRI indicated small acute infarcts in L MCA distribution and SAH overlying bilateral cerebral hemispheres. PMH includes 2 prior strokes in June 2021 (R-sided weakness) and June 2022 (L-sided weakness); vascular dementia; HTN; loop recorder implant 10/2019; osteoporosis; thrombocytopenia; anxiety; anemia    Patient Stated Goals "Anything I can to get better"    Currently in Pain? No/denies  Treatment/Exercises - 02/07/21    [facility] Reviewed coordination activities w/ RUE or BUEs as appropriate, including: rotating small ball w/ fingertips; tossing ball from one hand to the other; flipping cards off a deck; rotating cards in-hand/turning cards end-over end; attempting to push cards off deck using thumb; picking up 2 coins 1 at a time and translating palm-to-fingertips to place in a container; picking up a small object and translating palm-to-fingertips            OT Short Term Goals - 01/18/21 1233       OT SHORT TERM GOAL #1   Title Pt will improve  participation in manipulation of clothing fasteners as evidenced by completing 9-Hole Peg Test w/ less than 5 drops using R, dominant hand    Baseline R hand 2 min, 20 sec w/ 7 drops    Time 4    Period Weeks    Status Achieved   01/06/21 - 3 drops (1 min, 23 sec)   Target Date 01/17/21      OT SHORT TERM GOAL #2   Title Pt will improve safety w/ functional overhead reach by increasing strength of R shoulder flexion to at least 4-/5    Baseline 3+/5 (full ROM against gravity; slight resistance)    Time 4    Period Weeks    Status Achieved   01/18/21 - 4-/5   Target Date 01/17/21      OT SHORT TERM GOAL #3   Title Pt will demonstrate ability to thread first sleeve into jacket to improve initiation of task and independence w/ BADLs    Baseline Min A to initiate task    Time 4    Period Weeks    Status Achieved   01/05/20 - Mod I w/ donning jacket; assist only w/ threading zipper   Target Date 01/17/21             OT Long Term Goals - 02/07/21 1026       OT LONG TERM GOAL #1   Title Pt will demonstrate independence w/ full HEP designed for BUE strengthening, GM/FMC, and coordination by discharge    Baseline No HEP at this time    Time 8    Period Weeks    Status Achieved   02/07/21   Target Date 02/14/21      OT LONG TERM GOAL #2   Title Pt will improve RUE GMC and coordination for improved participation in ADLs as evidenced by increasing Box and Blocks score by at least 5 blocks    Baseline 01/06/21 - 20 blocks    Time 8    Period Weeks    Status Achieved   02/07/21 - 28 blocks; 01/18/21 - 24 blocks   Target Date 02/14/21      OT LONG TERM GOAL #3   Title Pt will be able to thread zipper w/ Mod I, using AE prn, in at least 1 attempt    Baseline Unable to thread zipper    Time 8    Period Weeks    Status Achieved   01/18/21 - Mod I (extended time; no AE)   Target Date 02/14/21      OT LONG TERM GOAL #4   Title Pt will be able to complete simulated self-feeding w/ Mod I  (scooping, bringing utensil to mouth, cutting food, etc.) using AE prn    Baseline Reports difficulty manipulating utensils    Time 8    Period Weeks  Status Partially Met   01/18/21 - reports continued difficulty only w/ cutting tough foods   Target Date 02/14/21      OT LONG TERM GOAL #5   Title Pt will be able to don socks w/ Mod I, using AE prn, in at least 1 attempt by d/c    Baseline Needs assist w/ donning socks    Time 8    Period Weeks    Status Achieved   01/18/21 - demonstrated w/ Mod I   Target Date 02/14/21      OT LONG TERM GOAL #6   Title Pt will improve R hand grip strength by at least 5 lbs to improve safety w/ IADLs and decrease object drops/spills    Baseline 01/18/21 - RUE 24 lbs (dominant side); LUE 35#    Time 8    Period Weeks    Status Achieved   02/07/21 - 43 lbs w/ RUE (56 lbs w/ LUE)   Target Date 02/14/21             Plan - 02/07/21 1050     Clinical Impression Statement Pt is a 66 y/o female who presents to OP OT due to R-sided weakness and decreased coordination, balance deficits, and limitations w/ executive functioning s/p recent cerebral infarction on 12/01/20. Pt has shown improvements in all areas addressed w/ OT completing informal reassessment toward goals and facilitating discussion regarding current level of participation in functional tasks; 3/3 STGs and 5/6 LTGs met at this time. Considering that pt continues to be active w/ her husband and reports no additional functional goals that she would like to address in therapy, OT discussed potential for d/c this session. In preparation for d/c, OT reviewed HEP and previously discussed compensatory strategies and education, providing updated handouts prn. Pt is currently appropriate for d/c from skilled OT to HEP at this time, reports she is satisfied with progress, and is currently agreeable to discharge plan.   OT Occupational Profile and History Detailed Assessment- Review of Records and additional  review of physical, cognitive, psychosocial history related to current functional performance    Occupational performance deficits (Please refer to evaluation for details): ADL's;IADL's;Rest and Sleep;Leisure;Social Participation    Body Structure / Function / Physical Skills ADL;Decreased knowledge of use of DME;Gait;Strength;Balance;Dexterity;GMC;Tone;Body mechanics;Proprioception;UE functional use;IADL;Endurance;ROM;Vision;Coordination;Mobility;Sensation;FMC    Cognitive Skills Memory;Problem Solve;Safety Awareness;Sequencing    Rehab Potential Fair   Due to comorbidities   Clinical Decision Making Several treatment options, min-mod task modification necessary    Comorbidities Affecting Occupational Performance: Presence of comorbidities impacting occupational performance    Comorbidities impacting occupational performance description: Vascular dementia; anxiety    Modification or Assistance to Complete Evaluation  Min-Moderate modification of tasks or assist with assess necessary to complete eval    OT Frequency 2x / week    OT Duration 8 weeks    OT Treatment/Interventions Self-care/ADL training;Aquatic Therapy;Electrical Stimulation;Moist Heat;Cryotherapy;Therapeutic exercise;Neuromuscular education;Energy conservation;Manual Therapy;Functional Mobility Training;DME and/or AE instruction;Passive range of motion;Patient/family education;Visual/perceptual remediation/compensation;Cognitive remediation/compensation;Therapeutic activities;Splinting;Balance training    Plan D/C    OT Home Exercise Plan MedBridge Code: [id number] - shoulder flex/ext w/ theraband; weight shift L/R in quadruped; scapular retraction on forearms in prone; gross grasp w/ putty; 3-pt and lateral pinch w/ putty    Recommended Other Services Currently receiving ST and PT services at this location    Consulted and Agree with Plan of Care Patient            Patient will benefit from skilled therapeutic intervention in  order to improve the following deficits and impairments:   Body Structure / Function / Physical Skills: ADL, Decreased knowledge of use of DME, Gait, Strength, Balance, Dexterity, GMC, Tone, Body mechanics, Proprioception, UE functional use, IADL, Endurance, ROM, Vision, Coordination, Mobility, Sensation, [facility] Cognitive Skills: Memory, Problem Solve, Safety Awareness, Sequencing   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 [facility])  Other lack of coordination  Muscle weakness (generalized)  Other abnormalities of gait and mobility   Problem List Patient Active Problem List   Diagnosis Date Noted   Screening for malignant neoplasm of colon 01/10/2021   Change in bowel habit 01/10/2021   Flatulence, eructation and gas pain 01/10/2021   Internal hemorrhoids 01/10/2021   Ingrowing nail, left great toe 01/10/2021   Dyslipidemia 12/29/2020   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 [place], [name], [address] Phone: [phone]   Fax:  [phone]  Name: Jacqueline Bonilla MRN: [id number] Date of Birth: 10-Apr-1955

## 2021-02-08 NOTE — Therapy (Signed)
[facility]. [place], [name], [address] Phone: [phone]   Fax:  [phone]  Speech Language Pathology Treatment & Discharge Summary  Patient Details  Name: Jacqueline Bonilla MRN: [id number] Date of Birth: 1955-03-28 Referring Provider (SLP): Dawayne Patricia   Encounter Date: 02/07/2021   End of Session - 02/08/21 1303     Visit Number 15    Number of Visits 17    Date for SLP Re-Evaluation 02/20/21    SLP Start Time 37    SLP Stop Time  1141    SLP Time Calculation (min) 39 min    Activity Tolerance Patient tolerated treatment well             Past Medical History:  Diagnosis Date   Anemia    Anxiety    Depression    Dysmenorrhea    Endometriosis    Fibroid    Hypertension    Microhematuria    negative workup   Osteoporosis    Tachycardia    Thrombocytopenia ([place])     Past Surgical History:  Procedure Laterality Date   BREAST BIOPSY     BUBBLE STUDY  12/08/2020   Procedure: BUBBLE STUDY;  Surgeon: Josue Hector, MD;  Location: [facility];  Service: Cardiovascular;;   CESAREAN SECTION     hysteroscopic resection     implantable loop recorder implant  10/21/2019   Medtronic Reveal Linq model LNQ 22 ([place] [id number]) implantable loop recorder   IR CT HEAD LTD  12/01/2020   IR PERCUTANEOUS ART THROMBECTOMY/INFUSION INTRACRANIAL INC DIAG ANGIO  12/01/2020   IR US GUIDE VASC ACCESS RIGHT  12/01/2020   RADIOLOGY WITH ANESTHESIA N/A 12/01/2020   Procedure: IR WITH ANESTHESIA - CODE STROKE;  Surgeon: Radiologist, Medication, MD;  Location: [facility];  Service: Radiology;  Laterality: N/A;   TEE WITHOUT CARDIOVERSION N/A 12/08/2020   Procedure: TRANSESOPHAGEAL ECHOCARDIOGRAM (TEE);  Surgeon: Josue Hector, MD;  Location: [facility] ENDOSCOPY;  Service: Cardiovascular;  Laterality: N/A;    There were no vitals filed for this visit.   Subjective Assessment - 02/07/21 1103     Subjective "I feel like  everything is going well."    Currently in Pain? No/denies                   ADULT SLP TREATMENT - 02/08/21 1300       General Information   Behavior/Cognition Alert;Cooperative;Pleasant mood      Treatment Provided   Treatment provided Cognitive-Linquistic      Cognitive-Linquistic Treatment   Treatment focused on Cognition;Patient/family/caregiver education    Skilled Treatment Reassessed pt using the SLUMS. Pt scored a 19/30 which indicates continued cognitive impairment. Pt appeared improved on memory sections; however, continues to exhibit difficulty with visuospatial skills. SLP edu pt on the importance of continued use of strategies to be successful. Pt was able to recall therapists name indepedently (after several sessions of repeating). Pt has met all goals and SLP is comfortable with discharge due to amount of assistance at home.      Assessment / Recommendations / Plan   Plan Continue with current plan of care;All goals met      Progression Toward Goals   Progression toward goals Goals met, education completed, patient discharged from [facility] - 01/17/21 [place]  GOAL #1   Title Pt will provide 3 areas of challenge for her given min verbal cues to demonstrate increased awareness.    Time 4    Period Weeks    Status Achieved    Target Date 01/17/21      SLP SHORT TERM GOAL #2   Title Pt will recall 3 memory strategies to aid recall of important information with minA.    Time 4    Period Weeks    Status Achieved    Target Date 01/17/21      SLP SHORT TERM GOAL #3   Title Pt will identify errors during structured cognitive task with modA verbal cues.    Time 4    Period Weeks    Status Not Met    Target Date 01/17/21      SLP SHORT TERM GOAL #4   Title Pt will recall 3 word finding strategies to decrease anomia in conversation given minA.    Time 4    Period Weeks    Status Not Met    Target  Date 01/17/21              SLP Long Term Goals - 02/08/21 1305       SLP LONG TERM GOAL #1   Title Pt will provide 3 areas of challenge for her independently to demonstrate increased awareness of deficits.    Time 8    Period Weeks    Status Achieved      SLP LONG TERM GOAL #2   Title Patient will demonstrate use of memory strategies to schedule activities, recall weekly events and items to maintain safety to participate socially in functional living environment    Time 8    Period Weeks    Status Achieved      SLP LONG TERM GOAL #3   Title Pt will identify errors during structured cognitive task with minA verbal cues.    Time 8    Period Weeks    Status Achieved      SLP LONG TERM GOAL #4   Title Pt will demonstrate 2-3 word finding strategies to decrease anomia in conversation independently.    Time 8    Period Weeks    Status Deferred   Pt reports not longer having word finding difficulty. Not observed by this SLP.             Plan - 02/08/21 1304     Clinical Impression Statement See tx note. Pt in agreement with discharge. Edu completed.    Speech Therapy Frequency 2x / week    Duration 8 weeks    Treatment/Interventions Compensatory strategies;Cueing hierarchy;Functional tasks;Patient/family education;Environmental controls;Cognitive reorganization;Multimodal communcation approach;Language facilitation;Compensatory techniques;Internal/external aids;SLP instruction and feedback    Potential to Achieve Goals Fair    Potential Considerations Ability to learn/carryover information    Consulted and Agree with Plan of Care Patient             Patient will benefit from skilled therapeutic intervention in order to improve the following deficits and impairments:   Cognitive communication deficit    Problem List Patient Active Problem List   Diagnosis Date Noted   Screening for malignant neoplasm of colon 01/10/2021   Change in bowel habit 01/10/2021    Flatulence, eructation and gas pain 01/10/2021   Internal hemorrhoids 01/10/2021   Ingrowing nail, left great toe 01/10/2021   Dyslipidemia 12/29/2020   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) 07/15/2019   Cerebrovascular accident ([place]) 06/25/2019   History of COVID-19 04/22/2019   Essential hypertension 03/03/2019   Hypertensive urgency 03/03/2019   SPEECH THERAPY DISCHARGE SUMMARY  Visits from Start of Care: 15  Current functional level related to goals / functional outcomes: Continues to required min-to-mod A on memory related tasks. Use of external strategies is limited due to poor visuospatial awareness; however, edu completed and strategies were provided to navigate.   Remaining deficits: Comprehension, memory, processing, problem solving, visuospatial skills    Education / Equipment: Completed   Patient agrees to discharge. Patient goals were met. Patient is being discharged due to meeting the stated rehab goals.[name], [place] 02/08/2021, 1:06 PM  [facility]. [place], [name], [address] Phone: [phone]   Fax:  [phone]   Name: SIRA ADSIT MRN: [id number] Date of Birth: [birth year]

## 2021-02-09 ENCOUNTER — Encounter: Payer: Medicare HMO | Admitting: Physical Therapy

## 2021-02-09 ENCOUNTER — Ambulatory Visit: Payer: Medicare HMO | Admitting: Speech Pathology

## 2021-02-09 ENCOUNTER — Ambulatory Visit: Payer: Medicare HMO | Admitting: Occupational Therapy

## 2021-02-10 ENCOUNTER — Encounter: Payer: Self-pay | Admitting: Physical Medicine & Rehabilitation

## 2021-02-10 ENCOUNTER — Other Ambulatory Visit: Payer: Self-pay

## 2021-02-10 ENCOUNTER — Encounter: Payer: Medicare HMO | Attending: Physical Medicine & Rehabilitation | Admitting: Physical Medicine & Rehabilitation

## 2021-02-10 VITALS — BP 159/88 | HR 81 | Ht 64.0 in | Wt 115.8 lb

## 2021-02-10 DIAGNOSIS — M9903 Segmental and somatic dysfunction of lumbar region: Secondary | ICD-10-CM | POA: Diagnosis not present

## 2021-02-10 DIAGNOSIS — M9904 Segmental and somatic dysfunction of sacral region: Secondary | ICD-10-CM | POA: Diagnosis not present

## 2021-02-10 DIAGNOSIS — M9901 Segmental and somatic dysfunction of cervical region: Secondary | ICD-10-CM | POA: Diagnosis not present

## 2021-02-10 DIAGNOSIS — M9902 Segmental and somatic dysfunction of thoracic region: Secondary | ICD-10-CM | POA: Diagnosis not present

## 2021-02-10 DIAGNOSIS — I63512 Cerebral infarction due to unspecified occlusion or stenosis of left middle cerebral artery: Secondary | ICD-10-CM | POA: Diagnosis not present

## 2021-02-10 NOTE — Patient Instructions (Signed)
Neurology will have to clear patient for return to drivng , needs a minimum of 6 months seizure free prior to Neuro evaluation

## 2021-02-10 NOTE — Progress Notes (Signed)
Subjective:    Patient ID: [name], female    DOB: 29-Dec-1955, 66 y.o.   MRN: [id number] 66 y.o. right-handed female with history of anxiety/depression chronic anemia 2 prior strokes in the past 14 months and did receive inpatient rehab services 06/28/2020 - 07/28/2020 1 presented with right side weakness followed by improvement of mild residual deficits in the sacral left side weakness maintained on aspirin with loop recorder implant October 2021 hypertension osteoporosis thrombocytopenia and vascular dementia.  Per chart review lives with spouse.  Modified independent prior to admission not driving.  Presented 12/01/2020 with acute onset of aphasia and right-sided incoordination relative to her baseline deficit.  Initial cranial CT scan negative for acute changes.  CT angiogram head and neck occlusion of distal left M2 MCA branch at the posterior aspect of the sylvian fissure.  No hemodynamically significant stenosis of the neck.  Patient underwent successful mechanical thrombectomy per interventional radiology and follow-up cranial CT scan showed subarachnoid hyperdensity over the both hemispheres left greater than right likely combination contrast extravasation and subarachnoid blood.  EEG 12/02/2020 showed no seizure however there was some evidence of cortical dysfunction and epileptogenicity arising from the left frontal parietal region and loaded with Keppra.  MRI/MRA completed 12/02/2020 scattered small acute infarcts in the left MCA distribution with also noted subarachnoid hemorrhage overlying the bilateral cerebral hemispheres not significantly changed from prior cranial CT scan.  Neurology follow-up maintained on low-dose aspirin therapy and Brilinta was later initiated.  Echocardiogram with ejection fraction of 55 to 60% no wall motion abnormalities.  TEE showed ejection fraction 60% without thrombus or PFO.  No source of embolus.  Bouts of urinary retention placed on low-dose Urecholine as  well as Flomax.  Therapy evaluations completed due to patient's aphasia and right-sided incoordination was admitted for a comprehensive rehab program.     Admit date: 12/08/2020 Discharge date: 12/17/2020 HPI Patient returns the office accompanied by husband, therapy notes reviewed receiving PT OT speech as outpatient.  Ambulated 1000 feet with PT, still exhibiting anomia with speech and language pathology. Elevated BP in office today no sx , recheck still elevated  One fall outdoors, no serious injury  Mod I with ADLs including simple meal prep, no assistive device needed for ambulation  No further seizures.  Husband has tried driving and empty parking lot with the patient.  Seen by PCP, will see neuro soon as well Pain Inventory Average Pain 0 Pain Right Now 0 My pain is  no pain  LOCATION OF PAIN  no pain  BOWEL Number of stools per week: 7+ Oral laxative use No  Type of laxative . Enema or suppository use No  History of colostomy No  Incontinent No   BLADDER Normal In and out cath, frequency . Able to self cath  na Bladder incontinence No  Frequent urination No  Leakage with coughing No  Difficulty starting stream No  Incomplete bladder emptying No    Mobility walk without assistance how many minutes can you walk? 40+ ability to climb steps?  yes do you drive?  no  Function disabled: date disabled 2020 I need assistance with the following:  dressing, meal prep, and household duties  Neuro/Psych weakness numbness tremor  Prior Studies bone scan  Physicians involved in your care Any changes since last visit?  no   Family History  Problem Relation Age of Onset   Cancer Father        non hodgkin lymphoma & skin  Heart attack Maternal Grandfather    Dementia Mother    Polymyositis Sister    Social History   Socioeconomic History   Marital status: Married    Spouse name: Psychologist, prison and probation services   Number of children: 1   Years of education: Not on file   Highest  education level: Not on file  Occupational History   Occupation: accounting  Tobacco Use   Smoking status: Never   Smokeless tobacco: Never  Vaping Use   Vaping Use: Never used  Substance and Sexual Activity   Alcohol use: Never   Drug use: Never   Sexual activity: Not Currently    Partners: Male    Comment: husband vasectomy  Other Topics Concern   Not on file  Social History Narrative   Lives with husband   Grandchildren - 1   Works - Investment banker, 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%   Gun in home - yes - secured      Right handed   Caffeine: maybe tea every now and then   Social Determinants of Radio broadcast assistant Strain: Low Risk    Difficulty of Paying Living Expenses: Not hard at all  Food Insecurity: No Food Insecurity   Worried About Charity fundraiser in the Last Year: Never true   Arboriculturist in the Last Year: Never true  Transportation Needs: No Transportation Needs   Lack of Transportation (Medical): No   Lack of Transportation (Non-Medical): No  Physical Activity: Sufficiently Active   Days of Exercise per Week: 5 days   Minutes of Exercise per Session: 30 min  Stress: No Stress Concern Present   Feeling of Stress : Not at all  Social Connections: Not on file   Past Surgical History:  Procedure Laterality Date   BREAST BIOPSY     BUBBLE STUDY  12/08/2020   Procedure: BUBBLE STUDY;  Surgeon: Josue Hector, MD;  Location: [facility] ENDOSCOPY;  Service: Cardiovascular;;   CESAREAN SECTION     hysteroscopic resection     implantable loop recorder implant  10/21/2019   Medtronic Reveal Linq model LNQ 22 ([place] [id number]) implantable loop recorder   IR CT HEAD LTD  12/01/2020   IR PERCUTANEOUS ART THROMBECTOMY/INFUSION INTRACRANIAL INC DIAG ANGIO  12/01/2020   IR US GUIDE VASC ACCESS RIGHT  12/01/2020   RADIOLOGY WITH ANESTHESIA N/A 12/01/2020   Procedure: IR WITH ANESTHESIA - CODE STROKE;  Surgeon: Radiologist, Medication, MD;  Location: [place];  Service: Radiology;   Laterality: N/A;   TEE WITHOUT CARDIOVERSION N/A 12/08/2020   Procedure: TRANSESOPHAGEAL ECHOCARDIOGRAM (TEE);  Surgeon: Josue Hector, MD;  Location: [facility] ENDOSCOPY;  Service: Cardiovascular;  Laterality: N/A;   Past Medical History:  Diagnosis Date   Anemia    Anxiety    Depression    Dysmenorrhea    Endometriosis    Fibroid    Hypertension    Microhematuria    negative workup   Osteoporosis    Tachycardia    Thrombocytopenia (HCC)    BP (!) 159/88    Pulse 81    Ht 5\' 4"  (1.626 m)    Wt 115 lb 12.8 oz (52.5 kg)    LMP 01/02/2003    SpO2 90%    BMI 19.88 kg/m   Opioid Risk Score:   Fall Risk Score:  `1  Depression screen PHQ 2/9  Depression screen [facility] 2/9 12/30/2020 12/28/2020 08/25/2020 07/20/2020 07/14/2020 01/18/2020 12/03/2019  Decreased Interest 0 0 0  0 0 0 0  Down, Depressed, Hopeless 0 0 0 0 0 0 0  PHQ - 2 Score 0 0 0 0 0 0 0  Altered sleeping 0 - - 0 - - -  Tired, decreased energy 1 - - 0 - - -  Change in appetite 0 - - 0 - - -  Feeling bad or failure about yourself  0 - - 0 - - -  Trouble concentrating 2 - - 0 - - -  Moving slowly or fidgety/restless 1 - - 0 - - -  Suicidal thoughts 0 - - 0 - - -  PHQ-9 Score 4 - - 0 - - -  Difficult doing work/chores Not difficult at all - - - - - -  Some recent data might be hidden      Review of Systems  Neurological:  Positive for tremors, weakness and numbness.  All other systems reviewed and are negative.     Objective:   Physical Exam Vitals and nursing note reviewed.  Constitutional:      Appearance: She is normal weight.  HENT:     Head: Normocephalic and atraumatic.  Eyes:     Extraocular Movements: Extraocular movements intact.     Conjunctiva/sclera: Conjunctivae normal.     Pupils: Pupils are equal, round, and reactive to light.  Neurological:     Mental Status: She is alert and oriented to person, place, and time.     Cranial Nerves: No dysarthria or facial asymmetry.     Sensory: Sensation is intact.      Motor: Weakness present.     Coordination: Coordination abnormal. Finger-Nose-Finger Test abnormal. Impaired rapid alternating movements.     Gait: Gait abnormal and tandem walk abnormal.     Comments: Motor strength is 4+ in the right deltoid, bicep, tricep, grip, hip flexor, 5/5 in the knee extensor ankle dorsiflexor 5/5 in left upper and left lower limb There is evidence of incoordination right upper extremity fine motor plus mild dysmetria.  Tone is symmetric bilaterally.  Psychiatric:        Mood and Affect: Mood normal.        Behavior: Behavior normal.          Assessment & Plan:  #1.  History of multiple infarcts most symptomatic from left MCA infarct.  Continue outpatient therapy.  Achieving goals.  Continue home exercise program now that outpatient therapy is winding up. 2.  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  Ultimately would need to be cleared by neurology 3.  Hypertension recheck blood pressure at home is still elevated instructed patient to contact Dr. Mitchel Honour

## 2021-02-14 ENCOUNTER — Ambulatory Visit: Payer: Medicare HMO | Admitting: Occupational Therapy

## 2021-02-14 ENCOUNTER — Ambulatory Visit: Payer: Medicare HMO | Admitting: Speech Pathology

## 2021-02-14 ENCOUNTER — Encounter: Payer: Medicare HMO | Admitting: Physical Therapy

## 2021-02-14 DIAGNOSIS — M9901 Segmental and somatic dysfunction of cervical region: Secondary | ICD-10-CM | POA: Diagnosis not present

## 2021-02-14 DIAGNOSIS — M9902 Segmental and somatic dysfunction of thoracic region: Secondary | ICD-10-CM | POA: Diagnosis not present

## 2021-02-14 DIAGNOSIS — M9903 Segmental and somatic dysfunction of lumbar region: Secondary | ICD-10-CM | POA: Diagnosis not present

## 2021-02-14 DIAGNOSIS — M9904 Segmental and somatic dysfunction of sacral region: Secondary | ICD-10-CM | POA: Diagnosis not present

## 2021-02-16 ENCOUNTER — Encounter: Payer: Medicare HMO | Admitting: Speech Pathology

## 2021-02-16 ENCOUNTER — Ambulatory Visit: Payer: Medicare HMO | Admitting: Occupational Therapy

## 2021-02-16 ENCOUNTER — Encounter: Payer: Medicare HMO | Admitting: Physical Therapy

## 2021-02-17 DIAGNOSIS — M9901 Segmental and somatic dysfunction of cervical region: Secondary | ICD-10-CM | POA: Diagnosis not present

## 2021-02-17 DIAGNOSIS — M9903 Segmental and somatic dysfunction of lumbar region: Secondary | ICD-10-CM | POA: Diagnosis not present

## 2021-02-17 DIAGNOSIS — M9904 Segmental and somatic dysfunction of sacral region: Secondary | ICD-10-CM | POA: Diagnosis not present

## 2021-02-17 DIAGNOSIS — M9902 Segmental and somatic dysfunction of thoracic region: Secondary | ICD-10-CM | POA: Diagnosis not present

## 2021-02-21 ENCOUNTER — Ambulatory Visit: Payer: Medicare HMO | Admitting: Neurology

## 2021-02-21 ENCOUNTER — Other Ambulatory Visit: Payer: Self-pay

## 2021-02-21 ENCOUNTER — Encounter: Payer: Self-pay | Admitting: Neurology

## 2021-02-21 VITALS — BP 145/88 | HR 89 | Ht 64.0 in | Wt 116.0 lb

## 2021-02-21 DIAGNOSIS — M9904 Segmental and somatic dysfunction of sacral region: Secondary | ICD-10-CM | POA: Diagnosis not present

## 2021-02-21 DIAGNOSIS — M9901 Segmental and somatic dysfunction of cervical region: Secondary | ICD-10-CM | POA: Diagnosis not present

## 2021-02-21 DIAGNOSIS — I639 Cerebral infarction, unspecified: Secondary | ICD-10-CM | POA: Diagnosis not present

## 2021-02-21 DIAGNOSIS — M9902 Segmental and somatic dysfunction of thoracic region: Secondary | ICD-10-CM | POA: Diagnosis not present

## 2021-02-21 DIAGNOSIS — F01518 Vascular dementia, unspecified severity, with other behavioral disturbance: Secondary | ICD-10-CM

## 2021-02-21 DIAGNOSIS — M9903 Segmental and somatic dysfunction of lumbar region: Secondary | ICD-10-CM | POA: Diagnosis not present

## 2021-02-21 MED ORDER — DONEPEZIL HCL 10 MG PO TABS: 10.0000 mg | ORAL_TABLET | Freq: Every day | ORAL | 11 refills | Status: DC

## 2021-02-21 NOTE — Progress Notes (Signed)
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  She has a history of major neurocognitive disorder likely vascular dementia and has had formal neurocognitive testing for this, cryptogenic embolic strokes, patient had embolic strokes while also being on a loop recorder which did not show any A-fib so at this point she has had 2 cryptogenic embolic strokes. Sh also has a PMHx  PMHx thrombocytopenia, tachycardia, osteoporosis, hypertension, dementia in her mother, depression, anxiety, anemia. Sleep apnea testing showed only mild sleep apnea.  LDL was 127, we recommended a statin to get it less than 70.  We performed an extensive evaluation for clotting disorders which were negative.  She had terrible fatigue on Plavix so we switched her back to a whole aspirin.  At last appointment we did talk about empiric blood thinners, hemoglobin A 1C was 6, TSH was normal.  Today we will talk about statins and possibly Repatha, she could see cardiology lipid clinic, at last appointment they decided to try to change her diet and LDL came back worse at 127.  She appears to do everything right, she exercises, no significant depression, she appears to be very compliant with her medication regiment.  At last appointment we performed a cancer screening.  CT chest abdomen pelvis showed no evidence of malignancy or metastatic disease in the chest abdomen or pelvis.  Since she was here it appears as though she was admitted in December for acute onset aphasia, right-sided weakness, presented to the emergency room December 2, NIHSS was 6.  CT of the head and neck showed a left middle cerebral artery stroke and large vessel occlusion and she went for interventional radiology which was successful but  final angiogram showed vasospasm with delayed flow to the branch, possible subarachnoid hemorrhage, decision was made not to treat the vasospasm due to the small subarachnoid hemorrhage seen on CT, transferred to ICU, MRI of the head and MRA of the head showed scattered small areas of diffusion restriction throughout the left MCA distribution with involvement of the insular cortex, anterior frontal lobe and parietal lobe cortex and corona radiata consistent with evolving acute infarcts with some intraparenchymal blood left parietal lobe and possibly left temporal lobe and subarachnoid blood.  Hypertension, seizure prophylaxis, hyperlipidemia, history of thrombocytopenia, vascular dementia, reviewing the notes CT of the head December 02, 2020 showed subarachnoid hyperdensity over both hemispheres left greater than right with a suspected intraparenchymal component in the left parietal lobe, chronic microvascular disease; likely a combination of extravasation and subarachnoid blood with subcortical intraparenchymal blood in the left parietal lobe.  She had a left MCA/M3 thrombectomy.  EEG also in the hospital showed continuous 3 to 6 Hz delta theta slowing in the left frontal region with frequent spikes in the frontotemporal region, evidence of cortical dysfunction and an epileptogenicity arising from the left frontotemporal region, likely from structural abnormality, no seizures seen.  TEE showed no evidence of clot or PFO, she was placed on Brilinta and aspirin for 30 days, she was placed in the [facility] stroke prevention trial, she was discharged on Tylenol, aspirin 81 mg, Lipitor, vitamin D, Keppra, Lidoderm patch, Cozaar, multivitamin daily, Protonix, Brilinta twice daily, thiamine and melatonin.  Loop recorder was again negative for A-fib.  Again, empiric anticoagulation treatment was brought up, patient failed swallow was  put on core track and started with tube feedings, they continued Keppra twice daily.  Here  with husband, on brilinta, went to rehab, not on the keppra, discussed restarting Keppra, seizure is a possibility we they prefer to stay off of Keppra unless something else happens, she is not driving, discussed seizure precautions, discussed trial with research. Feels improved, aphasia improved, she had dementia but overall doing well.    09/06/2020: Patient had another episode of embolic strokes, loop recorder did not show any afib so cryptogenic embolic strokes x 2. Terrible fatigue on the plavix, switched back to a whole aspirin. They feel her memory is stable. She is doing wordle everyday. She watches Doc Hassell Done and remembers the plotline. She has had a cxr and a mammogram, did cologuard. We discussed options, they are not interested in empiric blood thinners.  Hgba1c 6, tsh nml, ldl 108 she declines statins but recommend repatha or praluent. She saw Dr. Rayann Heman in the past could consider cardiology lipid clinic. They would like to see how changing her diet helps. check choelesterol panel. She is in OT. Geta 30-45 mins of brisk exercise. Hard snores. Had strokes.   06/23/19: IMPRESSION:    MRI brain (with and without) demonstrating: -Scattered acute, subacute and chronic ischemic infarcts in the bilateral cerebral hemispheres. Pattern suggests a proximal / cardioembolic source.  -Moderate chronic small vessel ischemic disease.     06/24/2020: IMPRESSION: MRI:   1. Punctate acute infarct in the high left frontal cortex. Additional faint punctate areas of DWI signal in bilateral precentral gyri are suspicious for additional acute or subacute infarcts, but could potentially represent artifact. Given potential involvement of multiple vascular territories, consider a central embolic etiology. 2. 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 3. Moderate atrophy.   MRA:   No large vessel occlusion or  proximal flow limiting stenosis.    Interval history 10/13/2019: formal memory testing showed Major Neurocognitive Disorder.  Patient is here with her husband who also provides information.  Patient feels as though she is doing better.  Husband states that she has been diligent with constant therapy, and ongoing cognitive rehab program for 6 weeks, targeting 40 aerobic minutes per day using a combination of trampoline, elliptical, recumbent combination light weights, yoga balance classes, deep water exercises, 1 therapy, various hand strengthening devices, her dietary regimen was interrupted recently, concerned about her right shoulder area (discussed with primary care), patient believes there is a conspiracy to keep her from resuming to drive and "get better" to mention these items in conference with provoker so husband's gave me a note instead, medications and supplements are unchanged, she is diligent with those protocols and generally upbeat, she is sleeping 7.75+ hours on a regular schedule.  Today we discussed starting a medication possibly Aricept and then going to Namenda, Aricept and Namenda are mostly used for Alzheimer's however there have been studies showing it can help with other types of dementia including vascular dementia.She saw Dr. Rayann Heman, no events, she is following up with him and they are going to follow up with him.   HPI:  Jacqueline Bonilla is a 66 y.o. female here as requested by Cathlyn Parsons, PA-C for "Brain Fog"  PMHx thrombocytopenia, tachycardia, osteoporosis, hypertension, dementia in her mother, depression, anxiety, anemia. Per patient: She had Covid in March. She felt like she had brain fog, cognitive decline, memory loss, she denies balance problems, tingling, balance problems, she has  fallen twice. Tingling in the hands, moves legs a lot but not worse. She feels like she is getting better however. She was working up until [employer] and denies stress, depression or anxiety,  no hx of psychiatric problems in life. No FHx of psychiatric issues. Mothr has dementia, she is in her 7s, she started showing signs in her 73s she is in long-term care right now. Unknown type. Generally feel happy. She fell a few times, started falling, 7 weeks ago she was walking and fell, unknown reason, she has fallen other times as well. A few weeks ago she fell again in a park, she states it was very hot both times however she tripped and fell and broke her fall with her elbows and hands, numbness and tingling in the hands and dropping things.   Husband provides most information: 3.5 years ago husband noticed paranoia, she claimed people were conspiring against her, she still believed in conspiracies and neighbor was involved, she said her phone was bugged. 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 No significant short term memory at the time, she still has delusions and husband used to fight with her but now she represses them. Lots of personality changes, antagonistic, beligerant, also short-term memory loss and loss of executive function, she is swearing. A gentleman was helping and she started screaming at him 3 years ago and this is not like her at all she had a big heart and would help people and offer people drinks and this is not like her. Slowly progressive. 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 No alcohol use, no cigarette smoking. Also ataxia, she leans to the right and forward and the right arm and leg flares out. She couldn't find her way out of pennies. She lost her smell and taste after covid. No rem sleep disorder.   Reviewed notes, labs and imaging from outside physicians, which showed:  TSH 1.4  Review of  Systems: Patient complains of symptoms per HPI as well as the following symptoms: stroke . Pertinent negatives and positives per HPI. All others negative      Social History   Socioeconomic History   Marital status: Married    Spouse name: Psychologist, prison and probation services   Number of children: 1   Years of education: Not on file   Highest education level: Not on file  Occupational History   Occupation: accounting  Tobacco Use   Smoking status: Never   Smokeless tobacco: Never  Vaping Use   Vaping Use: Never used  Substance and Sexual Activity   Alcohol use: Never   Drug use: Never   Sexual activity: Not Currently    Partners: Male    Comment: husband vasectomy  Other Topics Concern   Not on file  Social History Narrative   Lives with husband   Grandchildren - 1   Works - Investment banker, 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%   Gun in home - yes - secured      Right handed   Caffeine: maybe tea every now and then   Social Determinants of Radio broadcast assistant Strain: Low Risk    Difficulty of Paying Living Expenses: Not hard at all  Food Insecurity:  No Food Insecurity   Worried About Charity fundraiser in the Last Year: Never true   Ran Out of Food in the Last Year: Never true  Transportation Needs: No Transportation Needs   Lack of Transportation (Medical): No   Lack of Transportation (Non-Medical): No  Physical Activity: Sufficiently Active   Days of Exercise per Week: 5 days   Minutes of Exercise per Session: 30 min  Stress: No Stress Concern Present   Feeling of Stress : Not at all  Social Connections: Not on file  Intimate Partner Violence: Not At Risk   Fear of Current or Ex-Partner: No   Emotionally Abused: No   Physically Abused: No   Sexually Abused: No    Family History  Problem Relation Age of Onset   Cancer Father        non hodgkin lymphoma & skin   Heart attack Maternal Grandfather    Dementia Mother    Polymyositis Sister     Past Medical History:  Diagnosis Date   Anemia     Anxiety    Depression    Dysmenorrhea    Endometriosis    Fibroid    Hypertension    Microhematuria    negative workup   Osteoporosis    Tachycardia    Thrombocytopenia (HCC)     Patient Active Problem List   Diagnosis Date Noted   Screening for malignant neoplasm of colon 01/10/2021   Change in bowel habit 01/10/2021   Flatulence, eructation and gas pain 01/10/2021   Internal hemorrhoids 01/10/2021   Ingrowing nail, left great toe 01/10/2021   Dyslipidemia 12/29/2020   Left middle cerebral artery stroke ([place]) 12/08/2020   Stroke (cerebrum) ([place]) 12/01/2020   History of stroke 07/14/2020   Anemia 07/11/2020   CVA (cerebral vascular accident) ([place]) 06/28/2020   Cerebral infarction ([place]) 06/25/2020   Vascular dementia with behavior disturbance 10/13/2019   Vascular dementia without behavioral disturbance ([place]) 07/15/2019   Cerebrovascular accident ([place]) 06/25/2019   History of COVID-19 04/22/2019   Essential hypertension 03/03/2019   Hypertensive urgency 03/03/2019    Past Surgical History:  Procedure Laterality Date   BREAST BIOPSY     BUBBLE STUDY  12/08/2020   Procedure: BUBBLE STUDY;  Surgeon: Josue Hector, MD;  Location: [place];  Service: Cardiovascular;;   CESAREAN SECTION     hysteroscopic resection     implantable loop recorder implant  10/21/2019   Medtronic Reveal Linq model LNQ 22 ([id number] [id number]) implantable loop recorder   IR CT HEAD LTD  12/01/2020   IR PERCUTANEOUS ART THROMBECTOMY/INFUSION INTRACRANIAL INC DIAG ANGIO  12/01/2020   IR US GUIDE VASC ACCESS RIGHT  12/01/2020   RADIOLOGY WITH ANESTHESIA N/A 12/01/2020   Procedure: IR WITH ANESTHESIA - CODE STROKE;  Surgeon: Radiologist, Medication, MD;  Location: [place];  Service: Radiology;  Laterality: N/A;   TEE WITHOUT CARDIOVERSION N/A 12/08/2020   Procedure: TRANSESOPHAGEAL ECHOCARDIOGRAM (TEE);  Surgeon: Josue Hector, MD;  Location[facility] ENDOSCOPY;  Service: Cardiovascular;  Laterality: N/A;     Current Outpatient Medications  Medication Sig Dispense Refill   aspirin EC 81 MG tablet Take 81 mg by mouth daily. Swallow whole.     atorvastatin (LIPITOR) 40 MG tablet Take 1 tablet (40 mg total) by mouth daily. 30 tablet 0   donepezil (ARICEPT) 10 MG tablet Take 1 tablet (10 mg total) by mouth at bedtime. 30 tablet 11   losartan (COZAAR) 25 MG tablet Take 0.5 tablets (12.5 mg  total) by mouth daily. 30 tablet 0   melatonin 5 MG TABS Take 1 tablet (5 mg total) by mouth at bedtime as needed. 30 tablet 0   Multiple Vitamin (MULTIVITAMIN WITH MINERALS) TABS tablet Take 1 tablet by mouth daily.     Multiple Vitamins-Minerals (ZINC PO) Take 1 tablet by mouth daily.     pantoprazole (PROTONIX) 40 MG tablet Take 1 tablet (40 mg total) by mouth daily. 30 tablet 0   thiamine 100 MG tablet Take 1 tablet (100 mg total) by mouth daily. 30 tablet 0   ticagrelor (BRILINTA) 90 MG TABS tablet Take 1 tablet (90 mg total) by mouth 2 (two) times daily. 180 tablet 1   Turmeric (QC TUMERIC COMPLEX PO) Take 1 tablet by mouth daily.     No current facility-administered medications for this visit.    Allergies as of 02/21/2021 - Review Complete 02/21/2021  Allergen Reaction Noted   Avelox [moxifloxacin]  03/11/2019   Ciprofloxacin Hives 03/03/2019   Floraquin [iodoquinol]  07/30/2012   Iohexol  08/16/2005   Levaquin [levofloxacin]  03/11/2019   Plavix [clopidogrel] Diarrhea 10/27/2020   Quinolones  08/08/2017   Shellfish allergy Itching 06/25/2019    Vitals: BP (!) 145/88    Pulse 89    Ht 5\' 4"  (1.626 m)    Wt 116 lb (52.6 kg)    LMP 01/02/2003    BMI 19.91 kg/m  Last Weight:  Wt Readings from Last 1 Encounters:  02/21/21 116 lb (52.6 kg)   Last Height:   Ht Readings from Last 1 Encounters:  02/21/21 5\' 4"  (1.626 m)     Neuro: Stable, no changes Detailed Neurologic Exam  Speech:    Speech is normal; fluent and spontaneous.  Cognition: oriented to self, month date, day.     MMSE -  Mini Mental State Exam 04/12/2020 06/11/2019  Orientation to time 4 5  Orientation to Place 5 5  Registration 3 3  Attention/ Calculation 2 3  Recall 1 2  Language- name 2 objects 2 2  Language- repeat 1 1  Language- follow 3 step command 3 3  Language- read & follow direction 1 1  Write a sentence 1 0  Copy design 0 0  Total score 23 25   Physical exam: Exam: Gen: NAD, conversant, well nourised, well groomed                     CV: RRR, no MRG. No Carotid Bruits. No peripheral edema, warm, nontender Eyes: Conjunctivae clear without exudates or hemorrhage  Neuro: Detailed Neurologic Exam  Speech:    Speech is normal; fluent and spontaneous with normal comprehension.  Cranial Nerves:    The pupils are equal, round, and reactive to light. Pupils too small to visualize fundi. Visual fields are full to finger confrontation. Extraocular movements are intact. Trigeminal sensation is intact and the muscles of mastication are normal. Mild left faical drrop The palate elevates in the midline. Hearing intact. Voice is normal. Shoulder shrug is normal. The tongue has normal motion without fasciculations.   Coordination:    Some difficulty with finger to nose, can HTS  Gait:    Heel-toe and tandem gait with difficulty/imbalance.   Motor Observation:    No asymmetry, no atrophy, and no involuntary movements noted. Tone:    Normal muscle tone.    Posture:    Posture is normal. normal erect    Strength:    Mild right hemiparesis  Sensation: intact to LT     Reflex Exam:  DTR's:    Deep tendon reflexes 3+ the upper and lower extremities are normal bilaterally.   Toes:    The toes are equiv to downgoing bilaterally.   Clonus:    Clonus left Aj and patellar      Strength: Mild prox weakness may be due to poor effort but symmetrical, weak grip, otherwise strength is V/V in the upper and lower limbs.      Sensation: decreased left hand with impaired fine motor left hand      Reflex Exam:  DTR's:    Deep tendon reflexes in the upper and lower extremities are brisk bilaterally, right >> left Toes:  right upgoing, left downgoing    Clonus:    2 beats clonus    Assessment/Plan:  66 y.o. female here for follow up of vascular dementia and repeated (x3) emboic strokes without etiology. Most recent episode again of hospital admission with large vessel occlusion and multiple embolic strokes 05/3974, loop recorder did not show afib, cryptogenic embolic strokes in December 2022 where she had a right large vessel occlusion, status post TEE, complicated by subarachnoid hemorrhage and parenchymal extravasation, loop recorder again showed no A-fib, she was discharged on aspirin and Brilinta, also epileptogenic focus was seen on EEG and she was started on Keppra IR due to left facial twitching and EEG with left spikes, mechanical thrombectomy was successful, 2D echo showed normal ejection fraction no cardiac source of embolism, loop recorder was negative for A-fib, LDL was 89, hemoglobin A1c was 5.7, she was sent to acute inpatient rehab.  Again empiric anticoagulation was discussed, she was placed in a stroke trial by Dr. Leonie Man.  - Depth perception issues, no field cut, they request a check up with ophthalmology, they see an ophthalmologist in [facility]  -Epileptiform activity seen on EEG in the hospital.  They stopped Keppra, they would like to stay off of it unless there is another incident.  - Cryptogenic embolic strokes, 3x embolic cryptogenic strokes, last with large vessel occlusion right MCA s/p intervention radiology, again cryptogenic, cancer screening with blood work for hyercoag disorder and chest/abd/pelvs for primary cancer that could cause hypercoag state were all negative. Loop without afib x 3. TEE/Echo unevealing.  Discussed warfarin empirically.  She was screened for a stroke study, at this time I will ask her to see Dr. Leonie Man for a second opinion on treating this  patient empirically with anticoagulation.  -Sleep  apnea mild - testing does not require   - formal memory testing showed Major Neurocognitive Disorder likely vascular dementia. Moderate to extensive white matter changes and multiple embolic strokes. .  -  we discussed starting a medication possibly Aricept and then going to Namenda, Aricept and Namenda are mostly used for Alzheimer's however there have been studies showing it can help with other types of dementia including vascular dementia. Patient declined any medications in the past. I will start Ariept and they can decide.   - discussed with research team, they are going to speak with patient  - I had a long d/w patient and husband about stroke, risk for recurrent stroke/TIAs, personally independently reviewed imaging studies and stroke evaluation results and answered questions.Continue Brillinta for secondary stroke prevention and maintain strict control of hypertension with blood pressure goal below 160/90, diabetes with hemoglobin A1c goal below 6.0% and lipids with LDL cholesterol goal below 70 mg/dL     Cc: Angiulli, Lavon Paganini, PA-C,  Sarina Ill, MD  [facility] [address] [place], [place] [address]  Phone [phone] Fax [phone] I spent over 90 minutes of face-to-face and non-face-to-face time with patient on the  1. Cryptogenic stroke ([place])   2. Vascular dementia with behavior disturbance    diagnosis.  This included previsit chart review, lab review, study review, order entry, electronic health record documentation, patient education on the different diagnostic and therapeutic options, counseling and coordination of care, risks and benefits of management, compliance, or risk factor reduction

## 2021-02-21 NOTE — Patient Instructions (Signed)
Start Aricept Consider empiric anticoagulation like Warfarin (different than anti-platelet)  Donepezil Disintegrating Tablets What is this medication? DONEPEZIL (doe NEP e zil) treats memory loss and confusion (dementia) in people who have Alzheimer disease. It works by improving attention, memory, and the ability to engage in daily activities. It is not a cure for dementia or Alzheimer disease. This medicine may be used for other purposes; ask your health care provider or pharmacist if you have questions. COMMON BRAND NAME(S): Aricept What should I tell my care team before I take this medication? They need to know if you have any of these conditions: Asthma or other lung disease Difficulty passing urine Head injury Heart disease History of irregular heartbeat Liver disease Seizures (convulsions) Stomach or intestinal disease, ulcers or stomach bleeding An unusual or allergic reaction to donepezil, other medications, foods, dyes, or preservatives Pregnant or trying to get pregnant Breast-feeding How should I use this medication? Take this medication by mouth. Follow the directions on the prescription label. Place the tablet in the mouth and allow it to dissolve, then swallow. While you may take these tablets with water, it is not necessary to do so. You may take this medication with or without food. Take your doses at regular intervals. This medication is usually taken before bedtime. Do not take your medication more often than directed. Continue to take your medication even if you feel better. Do not stop taking except on the advice of your care team. Talk to your care team about the use of this medication in children. Special care may be needed. Overdosage: If you think you have taken too much of this medicine contact a poison control center or emergency room at once. NOTE: This medicine is only for you. Do not share this medicine with others. What if I miss a dose? If you miss a dose,  take it as soon as you can. If it is almost time for your next dose, take only that dose. Do not take double or extra doses. What may interact with this medication? Do not take this medication with any of the following: Certain medications for fungal infections like itraconazole, fluconazole, posaconazole, and voriconazole Cisapride Dextromethorphan; quinidine Dronedarone Pimozide Quinidine Thioridazine This medication may also interact with the following: Antihistamines for allergy, cough and cold Atropine Bethanechol Carbamazepine Certain medications for bladder problems like oxybutynin, tolterodine Certain medications for Parkinson's disease like benztropine, trihexyphenidyl Certain medications for stomach problems like dicyclomine, hyoscyamine Certain medications for travel sickness like scopolamine Dexamethasone Dofetilide Ipratropium NSAIDs, medications for pain and inflammation, like ibuprofen or naproxen Other medications for Alzheimer's disease Other medications that prolong the QT interval (cause an abnormal heart rhythm) Phenobarbital Phenytoin Rifampin, rifabutin or rifapentine Ziprasidone This list may not describe all possible interactions. Give your health care provider a list of all the medicines, herbs, non-prescription drugs, or dietary supplements you use. Also tell them if you smoke, drink alcohol, or use illegal drugs. Some items may interact with your medicine. What should I watch for while using this medication? Visit your care team for regular checks on your progress. Check with your care team if your symptoms do not get better or if they get worse. You may get drowsy or dizzy. Do not drive, use machinery, or do anything that needs mental alertness until you know how this medication affects you. What side effects may I notice from receiving this medication? Side effects that you should report to your care team as soon as possible: Allergic reactions--skin  rash,  itching, hives, swelling of the face, lips, tongue, or throat Peptic ulcer--burning stomach pain, loss of appetite, bloating, burping, heartburn, nausea, vomiting Seizures Slow heartbeat--dizziness, feeling faint or lightheaded, confusion, trouble breathing, unusual weakness or fatigue Stomach bleeding--bloody or black, tar-like stools, vomiting blood or brown material that looks like coffee grounds Trouble passing urine Side effects that usually do not require medical attention (report these to your care team if they continue or are bothersome): Diarrhea Fatigue Loss of appetite Muscle pain or cramps Nausea Trouble sleeping This list may not describe all possible side effects. Call your doctor for medical advice about side effects. You may report side effects to FDA at 1-800-FDA-1088. Where should I keep my medication? Keep out of reach of children. Store at room temperature between 15 and 30 degrees C (59 and 86 degrees F). Throw away any unused medication after the expiration date. NOTE: This sheet is a summary. It may not cover all possible information. If you have questions about this medicine, talk to your doctor, pharmacist, or health care provider.  2022 Elsevier/Gold Standard (2020-08-03 00:00:00)

## 2021-02-22 ENCOUNTER — Ambulatory Visit (INDEPENDENT_AMBULATORY_CARE_PROVIDER_SITE_OTHER): Payer: Medicare HMO | Admitting: Emergency Medicine

## 2021-02-22 ENCOUNTER — Other Ambulatory Visit: Payer: Self-pay

## 2021-02-22 VITALS — BP 122/68 | HR 73 | Ht 64.0 in | Wt 114.0 lb

## 2021-02-22 DIAGNOSIS — I10 Essential (primary) hypertension: Secondary | ICD-10-CM | POA: Diagnosis not present

## 2021-02-22 DIAGNOSIS — E785 Hyperlipidemia, unspecified: Secondary | ICD-10-CM | POA: Diagnosis not present

## 2021-02-22 DIAGNOSIS — F0150 Vascular dementia without behavioral disturbance: Secondary | ICD-10-CM | POA: Diagnosis not present

## 2021-02-22 DIAGNOSIS — Z8673 Personal history of transient ischemic attack (TIA), and cerebral infarction without residual deficits: Secondary | ICD-10-CM

## 2021-02-22 NOTE — Patient Instructions (Signed)
Cognitive Rehabilitation After a Stroke A stroke is caused by not getting enough blood flow to the brain. This can affect a person's thinking and memory. Rehabilitation is important for improving these areas. It can help with attention span, decision-making (executive function), and with the ability to recall information, such as words and objects. It also can help with language and perception. Rehabilitation can help to improve quality of life. What is cognitive rehabilitation? Cognitive rehabilitation is a program to help you improve your thinking skills after a stroke. It can help with memory, problem-solving, and communication skills. Therapy focuses on: Improving brain function. This may involve learning to break down tasks into simple steps. Coping with thinking problems. You might learn ways to help improve your memory or do activities that stimulate memory. These may include naming objects or describing pictures. Types of rehabilitation Cognitive rehabilitation refers to a group of therapies and may include: Speech-language therapy to help you understand and communicate better. Occupational therapy to help you with daily activities. Music therapy to help with stress, anxiety, and depression. This may involve listening to music, singing, or playing instruments. Physical therapy to help you get stronger and move better. These therapies may involve: Helping you to learn ways to cope with memory problems, such as setting alarms to remind you to take medicines. You also may learn new strategies to help your memory. Using virtual reality or video games to help you remember words, names of objects, and other things. Exercise to help increase blood flow to the brain. Summary After a stroke, some people have problems with thinking, memory, language, communication, and problem-solving. Cognitive rehabilitation is a program to help you regain brain function and learn skills to cope with thinking  problems. Rehabilitation can help to improve quality of life. Cognitive rehabilitation may include speech-language therapy, occupational therapy, music therapy, and physical therapy. This information is not intended to replace advice given to you by your health care provider. Make sure you discuss any questions you have with your health care provider. Document Revised: 11/20/2019 Document Reviewed: 11/20/2019 Elsevier Patient Education  2022 [employer].

## 2021-02-22 NOTE — Progress Notes (Signed)
Jacqueline Bonilla 66 y.o.   Chief Complaint  Patient presents with   Follow-up    8 wk f/u from hospital visit, stroke    HISTORY OF PRESENT ILLNESS: This is a 66 y.o. female here for follow-up of cryptogenic stroke. Doing well and making progress.  No new symptoms. Eating well and staying physically active.  Very engaged with rehab. Here with husband who is extremely supportive and great help. On aspirin and Brilinta.  No clinical bleeding. Not on seizure medications. BP Readings from Last 3 Encounters:  02/22/21 122/68  02/21/21 (!) 145/88  02/10/21 (!) 159/88   Most recent neurologist office visit notes assessment and plan as follows:  Assessment/Plan:  66 y.o. female here for follow up of vascular dementia and repeated (x3) emboic strokes without etiology. Most recent episode again of hospital admission with large vessel occlusion and multiple embolic strokes 42/7062, loop recorder did not show afib, cryptogenic embolic strokes in December 2022 where she had a right large vessel occlusion, status post TEE, complicated by subarachnoid hemorrhage and parenchymal extravasation, loop recorder again showed no A-fib, she was discharged on aspirin and Brilinta, also epileptogenic focus was seen on EEG and she was started on Keppra IR due to left facial twitching and EEG with left spikes, mechanical thrombectomy was successful, 2D echo showed normal ejection fraction no cardiac source of embolism, loop recorder was negative for A-fib, LDL was 89, hemoglobin A1c was 5.7, she was sent to acute inpatient rehab.  Again empiric anticoagulation was discussed, she was placed in a stroke trial by Dr. Leonie Man.   - Depth perception issues, no field cut, they request a check up with ophthalmology, they see an ophthalmologist in [facility]   -Epileptiform activity seen on EEG in the hospital.  They stopped Keppra, they would like to stay off of it unless there is another incident.   - Cryptogenic embolic  strokes, 3x embolic cryptogenic strokes, last with large vessel occlusion right MCA s/p intervention radiology, again cryptogenic, cancer screening with blood work for hyercoag disorder and chest/abd/pelvs for primary cancer that could cause hypercoag state were all negative. Loop without afib x 3. TEE/Echo unevealing.  Discussed warfarin empirically.  She was screened for a stroke study, at this time I will ask her to see Dr. Leonie Man for a second opinion on treating this patient empirically with anticoagulation.   -Sleep  apnea mild - testing does not require    - formal memory testing showed Major Neurocognitive Disorder likely vascular dementia. Moderate to extensive white matter changes and multiple embolic strokes. .   -  we discussed starting a medication possibly Aricept and then going to Namenda, Aricept and Namenda are mostly used for Alzheimer's however there have been studies showing it can help with other types of dementia including vascular dementia. Patient declined any medications in the past. I will start Ariept and they can decide.    - discussed with research team, they are going to speak with patient   - I had a long d/w patient and husband about stroke, risk for recurrent stroke/TIAs, personally independently reviewed imaging studies and stroke evaluation results and answered questions.Continue Brillinta for secondary stroke prevention and maintain strict control of hypertension with blood pressure goal below 160/90, diabetes with hemoglobin A1c goal below 6.0% and lipids with LDL cholesterol goal below 70 mg/dL         Cc: Angiulli, Lavon Paganini, PA-C,     Sarina Ill, [facility] [phone]  [place], [place] [address]    Here with husband, on brilinta, went to rehab, not on the keppra, discussed restarting Keppra, seizure is a possibility we they prefer to stay off of Keppra unless something else happens, she is not driving, discussed  seizure precautions, discussed trial with research. Feels improved, aphasia improved, she had dementia but overall doing well   Assessment & Plan:  #1.  History of multiple infarcts most symptomatic from left MCA infarct.  Continue outpatient therapy.  Achieving goals.  Continue home exercise program now that outpatient therapy is winding up. 2.  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  Ultimately would need to be cleared by neurology 3.  Hypertension recheck blood pressure at home is still elevated instructed patient to contact Dr. Mitchel Honour     HPI   Prior to Admission medications   Medication Sig Start Date End Date Taking? Authorizing Provider  aspirin EC 81 MG tablet Take 81 mg by mouth daily. Swallow whole.   Yes [provider]  atorvastatin (LIPITOR) 40 MG tablet Take 1 tablet (40 mg total) by mouth daily. 12/15/20  Yes Angiulli, Lavon Paganini, PA-C  donepezil (ARICEPT) 10 MG tablet Take 1 tablet (10 mg total) by mouth at bedtime. 02/21/21  Yes [name], MD  losartan (COZAAR) 25 MG tablet Take 0.5 tablets (12.5 mg total) by mouth daily. 12/15/20  Yes Angiulli, Lavon Paganini, PA-C  melatonin 5 MG TABS Take 1 tablet (5 mg total) by mouth at bedtime as needed. 12/15/20  Yes Angiulli, Lavon Paganini, PA-C  Multiple Vitamin (MULTIVITAMIN WITH MINERALS) TABS tablet Take 1 tablet by mouth daily.   Yes [provider]  Multiple Vitamins-Minerals (ZINC PO) Take 1 tablet by mouth daily.   Yes [provider]  pantoprazole (PROTONIX) 40 MG tablet Take 1 tablet (40 mg total) by mouth daily. 12/15/20  Yes Angiulli, Lavon Paganini, PA-C  thiamine 100 MG tablet Take 1 tablet (100 mg total) by mouth daily. 12/15/20  Yes Angiulli, Lavon Paganini, PA-C  ticagrelor (BRILINTA) 90 MG TABS tablet Take 1 tablet (90 mg total) by mouth 2 (two) times daily. 01/19/21 04/19/21 Yes  Ahsha Hinsley, Ines Bloomer, MD  Turmeric (QC TUMERIC COMPLEX PO) Take 1 tablet by mouth daily.   Yes [provider]    Allergies  Allergen Reactions   Avelox [Moxifloxacin]    Ciprofloxacin Hives   Floraquin [Iodoquinol]     No cipro or others   Iohexol      Code: HIVES, Desc: HIVES S/P IVP MANY YRS AGO    Levaquin [Levofloxacin]    Plavix [Clopidogrel] Diarrhea   Quinolones    Shellfish Allergy Itching    Itching under the chin. Described by patient but not seen by husband.    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   Stroke (cerebrum) ([place]) 12/01/2020   History of stroke 07/14/2020   Anemia 07/11/2020   CVA (cerebral vascular accident) ([place]) 06/28/2020   Cerebral infarction ([place]) 06/25/2020   Vascular dementia with behavior disturbance 10/13/2019   Vascular dementia without behavioral disturbance ([place]) 07/15/2019  Cerebrovascular accident ([place]) 06/25/2019   History of COVID-19 04/22/2019   Essential hypertension 03/03/2019   Hypertensive urgency 03/03/2019    Past Medical History:  Diagnosis Date   Anemia    Anxiety    Depression    Dysmenorrhea    Endometriosis    Fibroid    Hypertension    Microhematuria    negative workup   Osteoporosis    Tachycardia    Thrombocytopenia ([place])     Past Surgical History:  Procedure Laterality Date   BREAST BIOPSY     BUBBLE STUDY  12/08/2020   Procedure: BUBBLE STUDY;  Surgeon: Josue Hector, MD;  Location: [place];  Service: Cardiovascular;;   CESAREAN SECTION     hysteroscopic resection     implantable loop recorder implant  10/21/2019   Medtronic Reveal Linq model LNQ 22 ([id number] [id number])  implantable loop recorder   IR CT HEAD LTD  12/01/2020   IR PERCUTANEOUS ART THROMBECTOMY/INFUSION INTRACRANIAL INC DIAG ANGIO  12/01/2020   IR US GUIDE VASC ACCESS RIGHT  12/01/2020   RADIOLOGY WITH ANESTHESIA N/A 12/01/2020   Procedure: IR WITH ANESTHESIA - CODE STROKE;  Surgeon: Radiologist, Medication, MD;  Location: [place];  Service: Radiology;  Laterality: N/A;   TEE WITHOUT CARDIOVERSION N/A 12/08/2020   Procedure: TRANSESOPHAGEAL ECHOCARDIOGRAM (TEE);  Surgeon: Josue Hector, MD;  Location: [facility] ENDOSCOPY;  Service: Cardiovascular;  Laterality: N/A;    Social History   Socioeconomic History   Marital status: Married    Spouse name: Psychologist, prison and probation services   Number of children: 1   Years of education: Not on file   Highest education level: Not on file  Occupational History   Occupation: accounting  Tobacco Use   Smoking status: Never   Smokeless tobacco: Never  Vaping Use   Vaping Use: Never used  Substance and Sexual Activity   Alcohol use: Never   Drug use: Never   Sexual activity: Not Currently    Partners: Male    Comment: husband vasectomy  Other Topics Concern   Not on file  Social History Narrative   Lives with husband   Grandchildren - 1   Works - Investment banker, 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%   Gun in home - yes - secured      Right handed   Caffeine: maybe tea every now and then   Social Determinants of Radio broadcast assistant Strain: Low Risk    Difficulty of Paying Living Expenses: Not hard at all  Food Insecurity: No Food Insecurity   Worried About Charity fundraiser in the Last Year: Never true   Arboriculturist in the Last Year: Never true  Transportation Needs: No Transportation Needs   Lack of Transportation (Medical): No   Lack of Transportation (Non-Medical): No  Physical Activity: Sufficiently Active   Days of Exercise per Week: 5 days   Minutes of Exercise per Session: 30 min  Stress: No Stress Concern Present   Feeling of Stress : Not at all  Social Connections:  Not on file  Intimate Partner Violence: Not At Risk   Fear of Current or Ex-Partner: No   Emotionally Abused: No   Physically Abused: No   Sexually Abused: No    Family History  Problem Relation Age of Onset   Cancer Father        non hodgkin lymphoma & skin   Heart attack Maternal Grandfather    Dementia Mother  Polymyositis Sister      Review of Systems  Constitutional: Negative.  Negative for chills and fever.  HENT:  Negative for nosebleeds and sore throat.   Eyes: Negative.   Respiratory:  Negative for cough, hemoptysis and shortness of breath.   Cardiovascular: Negative.  Negative for chest pain.  Gastrointestinal: Negative.  Negative for abdominal pain, blood in stool, diarrhea, nausea and vomiting.  Genitourinary: Negative.  Negative for dysuria and hematuria.  Neurological:  Negative for dizziness and headaches.   Today's Vitals   02/22/21 1506  BP: 122/68  Pulse: 73  SpO2: 94%  Weight: 114 lb (51.7 kg)  Height: 5\' 4"  (1.626 m)   Body mass index is 19.57 kg/m. Lab Results  Component Value Date   CHOL 153 12/02/2020   HDL 48 12/02/2020   LDLCALC 89 12/02/2020   TRIG 81 12/02/2020   CHOLHDL 3.2 12/02/2020   Lab Results  Component Value Date   HGBA1C 5.7 (H) 12/02/2020    Physical Exam Vitals reviewed.  Constitutional:      Appearance: Normal appearance.  HENT:     Head: Normocephalic.     Mouth/Throat:     Mouth: Mucous membranes are moist.     Pharynx: Oropharynx is clear.  Eyes:     Extraocular Movements: Extraocular movements intact.     Conjunctiva/sclera: Conjunctivae normal.     Pupils: Pupils are equal, round, and reactive to light.  Cardiovascular:     Rate and Rhythm: Normal rate and regular rhythm.     Pulses: Normal pulses.     Heart sounds: Normal heart sounds.  Pulmonary:     Effort: Pulmonary effort is normal.     Breath sounds: Normal breath sounds.  Abdominal:     Palpations: Abdomen is soft.     Tenderness: There is no  abdominal tenderness.  Musculoskeletal:     Cervical back: No tenderness.     Right lower leg: No edema.     Left lower leg: No edema.  Lymphadenopathy:     Cervical: No cervical adenopathy.  Skin:    General: Skin is warm and dry.     Capillary Refill: Capillary refill takes less than 2 seconds.  Neurological:     Mental Status: She is alert and oriented to person, place, and time. Mental status is at baseline.     Comments: Normal speech. Pretty good strength bilaterally. Still has mild issues with right hand dexterity Able to walk a straight line but at times have mild issues with balance.  Psychiatric:        Mood and Affect: Mood normal.        Behavior: Behavior normal.     ASSESSMENT & PLAN: Problem List Items Addressed This Visit       Cardiovascular and Mediastinum   Essential hypertension    Well-controlled hypertension.  Continue losartan 25 mg daily.        Nervous and Auditory   Vascular dementia without behavioral disturbance (HCC) - Primary    Stable.  Making progress.        Other   History of stroke    Recovering well from most recent stroke. Secondary prevention discussed. Continue Brilinta and aspirin.      Dyslipidemia    Stable.Takes atorvastatin erratically.      Patient Instructions  Cognitive Rehabilitation After a Stroke A stroke is caused by not getting enough blood flow to the brain. This can affect a person's thinking and memory. Rehabilitation is important  for improving these areas. It can help with attention span, decision-making (executive function), and with the ability to recall information, such as words and objects. It also can help with language and perception. Rehabilitation can help to improve quality of life. What is cognitive rehabilitation? Cognitive rehabilitation is a program to help you improve your thinking skills after a stroke. It can help with memory, problem-solving, and communication skills. Therapy focuses  on: Improving brain function. This may involve learning to break down tasks into simple steps. Coping with thinking problems. You might learn ways to help improve your memory or do activities that stimulate memory. These may include naming objects or describing pictures. Types of rehabilitation Cognitive rehabilitation refers to a group of therapies and may include: Speech-language therapy to help you understand and communicate better. Occupational therapy to help you with daily activities. Music therapy to help with stress, anxiety, and depression. This may involve listening to music, singing, or playing instruments. Physical therapy to help you get stronger and move better. These therapies may involve: Helping you to learn ways to cope with memory problems, such as setting alarms to remind you to take medicines. You also may learn new strategies to help your memory. Using virtual reality or video games to help you remember words, names of objects, and other things. Exercise to help increase blood flow to the brain. Summary After a stroke, some people have problems with thinking, memory, language, communication, and problem-solving. Cognitive rehabilitation is a program to help you regain brain function and learn skills to cope with thinking problems. Rehabilitation can help to improve quality of life. Cognitive rehabilitation may include speech-language therapy, occupational therapy, music therapy, and physical therapy. This information is not intended to replace advice given to you by your health care provider. Make sure you discuss any questions you have with your health care provider. Document Revised: 11/20/2019 Document Reviewed: 11/20/2019 Elsevier Patient Education  2022 [place], MD [facility]

## 2021-02-24 DIAGNOSIS — M9904 Segmental and somatic dysfunction of sacral region: Secondary | ICD-10-CM | POA: Diagnosis not present

## 2021-02-24 DIAGNOSIS — M9901 Segmental and somatic dysfunction of cervical region: Secondary | ICD-10-CM | POA: Diagnosis not present

## 2021-02-24 DIAGNOSIS — M9903 Segmental and somatic dysfunction of lumbar region: Secondary | ICD-10-CM | POA: Diagnosis not present

## 2021-02-24 DIAGNOSIS — M9902 Segmental and somatic dysfunction of thoracic region: Secondary | ICD-10-CM | POA: Diagnosis not present

## 2021-02-27 ENCOUNTER — Encounter: Payer: Self-pay | Admitting: Emergency Medicine

## 2021-02-27 NOTE — Assessment & Plan Note (Addendum)
Recovering well from most recent stroke. Secondary prevention discussed. Continue Brilinta and aspirin.

## 2021-02-27 NOTE — Assessment & Plan Note (Signed)
Stable.  Making progress.

## 2021-02-27 NOTE — Assessment & Plan Note (Signed)
Stable.Takes atorvastatin erratically.

## 2021-02-27 NOTE — Assessment & Plan Note (Signed)
Well-controlled hypertension.  Continue losartan 25 mg daily.

## 2021-03-01 DIAGNOSIS — M9904 Segmental and somatic dysfunction of sacral region: Secondary | ICD-10-CM | POA: Diagnosis not present

## 2021-03-01 DIAGNOSIS — M9902 Segmental and somatic dysfunction of thoracic region: Secondary | ICD-10-CM | POA: Diagnosis not present

## 2021-03-01 DIAGNOSIS — M9903 Segmental and somatic dysfunction of lumbar region: Secondary | ICD-10-CM | POA: Diagnosis not present

## 2021-03-01 DIAGNOSIS — M9901 Segmental and somatic dysfunction of cervical region: Secondary | ICD-10-CM | POA: Diagnosis not present

## 2021-03-03 DIAGNOSIS — M9903 Segmental and somatic dysfunction of lumbar region: Secondary | ICD-10-CM | POA: Diagnosis not present

## 2021-03-03 DIAGNOSIS — M9901 Segmental and somatic dysfunction of cervical region: Secondary | ICD-10-CM | POA: Diagnosis not present

## 2021-03-03 DIAGNOSIS — M9904 Segmental and somatic dysfunction of sacral region: Secondary | ICD-10-CM | POA: Diagnosis not present

## 2021-03-03 DIAGNOSIS — M9902 Segmental and somatic dysfunction of thoracic region: Secondary | ICD-10-CM | POA: Diagnosis not present

## 2021-03-06 ENCOUNTER — Ambulatory Visit (INDEPENDENT_AMBULATORY_CARE_PROVIDER_SITE_OTHER): Payer: Medicare HMO

## 2021-03-06 DIAGNOSIS — I63321 Cerebral infarction due to thrombosis of right anterior cerebral artery: Secondary | ICD-10-CM | POA: Diagnosis not present

## 2021-03-06 LAB — CUP PACEART REMOTE DEVICE CHECK
Date Time Interrogation Session: 20230303230819
Implantable Pulse Generator Implant Date: 20211020

## 2021-03-07 DIAGNOSIS — M9904 Segmental and somatic dysfunction of sacral region: Secondary | ICD-10-CM | POA: Diagnosis not present

## 2021-03-07 DIAGNOSIS — M9901 Segmental and somatic dysfunction of cervical region: Secondary | ICD-10-CM | POA: Diagnosis not present

## 2021-03-07 DIAGNOSIS — M9903 Segmental and somatic dysfunction of lumbar region: Secondary | ICD-10-CM | POA: Diagnosis not present

## 2021-03-07 DIAGNOSIS — M9902 Segmental and somatic dysfunction of thoracic region: Secondary | ICD-10-CM | POA: Diagnosis not present

## 2021-03-13 ENCOUNTER — Ambulatory Visit: Payer: Medicare HMO | Admitting: Adult Health

## 2021-03-14 ENCOUNTER — Other Ambulatory Visit: Payer: Self-pay

## 2021-03-14 DIAGNOSIS — M9901 Segmental and somatic dysfunction of cervical region: Secondary | ICD-10-CM | POA: Diagnosis not present

## 2021-03-14 DIAGNOSIS — M9902 Segmental and somatic dysfunction of thoracic region: Secondary | ICD-10-CM | POA: Diagnosis not present

## 2021-03-14 DIAGNOSIS — M9904 Segmental and somatic dysfunction of sacral region: Secondary | ICD-10-CM | POA: Diagnosis not present

## 2021-03-14 DIAGNOSIS — M9903 Segmental and somatic dysfunction of lumbar region: Secondary | ICD-10-CM | POA: Diagnosis not present

## 2021-03-14 NOTE — Progress Notes (Signed)
Carelink Summary Report / Loop Recorder 

## 2021-03-14 NOTE — Patient Outreach (Signed)
[facility] [facility]) Care Management ? ?03/14/2021 ? ?[name] ?04/29/1955 ?[id number] ? ? ?First telephone outreach attempt to obtain mRS. No answer. Unable to leave message for returned call. ? ?Philmore Pali ?[facility]-Care Management Assistant ?[phone] ? ?

## 2021-03-14 NOTE — Patient Outreach (Signed)
[place] [facility]) Care Management ? ?03/14/2021 ? ?Jacqueline Bonilla ?August 27, 1955 ?[id number] ? ? ?Patient's husband called back. I was able to obtain mRS successfully completed. MRS= 3 ? ?Jacqueline Bonilla ?[place] Management Assistant ?[phone] ? ? ?

## 2021-03-21 DIAGNOSIS — M9904 Segmental and somatic dysfunction of sacral region: Secondary | ICD-10-CM | POA: Diagnosis not present

## 2021-03-21 DIAGNOSIS — M9901 Segmental and somatic dysfunction of cervical region: Secondary | ICD-10-CM | POA: Diagnosis not present

## 2021-03-21 DIAGNOSIS — M9902 Segmental and somatic dysfunction of thoracic region: Secondary | ICD-10-CM | POA: Diagnosis not present

## 2021-03-21 DIAGNOSIS — M9903 Segmental and somatic dysfunction of lumbar region: Secondary | ICD-10-CM | POA: Diagnosis not present

## 2021-03-27 DIAGNOSIS — M9904 Segmental and somatic dysfunction of sacral region: Secondary | ICD-10-CM | POA: Diagnosis not present

## 2021-03-27 DIAGNOSIS — M9903 Segmental and somatic dysfunction of lumbar region: Secondary | ICD-10-CM | POA: Diagnosis not present

## 2021-03-27 DIAGNOSIS — M9901 Segmental and somatic dysfunction of cervical region: Secondary | ICD-10-CM | POA: Diagnosis not present

## 2021-03-27 DIAGNOSIS — M9902 Segmental and somatic dysfunction of thoracic region: Secondary | ICD-10-CM | POA: Diagnosis not present

## 2021-03-30 DIAGNOSIS — M9902 Segmental and somatic dysfunction of thoracic region: Secondary | ICD-10-CM | POA: Diagnosis not present

## 2021-03-30 DIAGNOSIS — M9903 Segmental and somatic dysfunction of lumbar region: Secondary | ICD-10-CM | POA: Diagnosis not present

## 2021-03-30 DIAGNOSIS — M9901 Segmental and somatic dysfunction of cervical region: Secondary | ICD-10-CM | POA: Diagnosis not present

## 2021-03-30 DIAGNOSIS — M9904 Segmental and somatic dysfunction of sacral region: Secondary | ICD-10-CM | POA: Diagnosis not present

## 2021-04-06 DIAGNOSIS — M9902 Segmental and somatic dysfunction of thoracic region: Secondary | ICD-10-CM | POA: Diagnosis not present

## 2021-04-06 DIAGNOSIS — M9901 Segmental and somatic dysfunction of cervical region: Secondary | ICD-10-CM | POA: Diagnosis not present

## 2021-04-06 DIAGNOSIS — M9904 Segmental and somatic dysfunction of sacral region: Secondary | ICD-10-CM | POA: Diagnosis not present

## 2021-04-06 DIAGNOSIS — M9903 Segmental and somatic dysfunction of lumbar region: Secondary | ICD-10-CM | POA: Diagnosis not present

## 2021-04-10 ENCOUNTER — Ambulatory Visit (INDEPENDENT_AMBULATORY_CARE_PROVIDER_SITE_OTHER): Payer: Medicare HMO

## 2021-04-10 DIAGNOSIS — I639 Cerebral infarction, unspecified: Secondary | ICD-10-CM | POA: Diagnosis not present

## 2021-04-11 LAB — CUP PACEART REMOTE DEVICE CHECK
Date Time Interrogation Session: 20230405230647
Implantable Pulse Generator Implant Date: 20211020

## 2021-04-12 DIAGNOSIS — M9902 Segmental and somatic dysfunction of thoracic region: Secondary | ICD-10-CM | POA: Diagnosis not present

## 2021-04-12 DIAGNOSIS — M9904 Segmental and somatic dysfunction of sacral region: Secondary | ICD-10-CM | POA: Diagnosis not present

## 2021-04-12 DIAGNOSIS — M9901 Segmental and somatic dysfunction of cervical region: Secondary | ICD-10-CM | POA: Diagnosis not present

## 2021-04-12 DIAGNOSIS — M9903 Segmental and somatic dysfunction of lumbar region: Secondary | ICD-10-CM | POA: Diagnosis not present

## 2021-04-21 DIAGNOSIS — M9904 Segmental and somatic dysfunction of sacral region: Secondary | ICD-10-CM | POA: Diagnosis not present

## 2021-04-21 DIAGNOSIS — M9901 Segmental and somatic dysfunction of cervical region: Secondary | ICD-10-CM | POA: Diagnosis not present

## 2021-04-21 DIAGNOSIS — M9902 Segmental and somatic dysfunction of thoracic region: Secondary | ICD-10-CM | POA: Diagnosis not present

## 2021-04-21 DIAGNOSIS — M9903 Segmental and somatic dysfunction of lumbar region: Secondary | ICD-10-CM | POA: Diagnosis not present

## 2021-04-24 ENCOUNTER — Telehealth: Payer: Self-pay | Admitting: Emergency Medicine

## 2021-04-24 MED ORDER — LOSARTAN POTASSIUM 25 MG PO TABS: 12.5000 mg | ORAL_TABLET | Freq: Every day | ORAL | 0 refills | Status: DC

## 2021-04-24 NOTE — Telephone Encounter (Signed)
Pt's husband called about an RX for Losartan '25mg'$ . Claims trouble getting it refilled at [facility] and would like [name] to please call it in.  ?

## 2021-04-24 NOTE — Telephone Encounter (Signed)
Called patient spouse to inform him that medication was sent to pharmacy on file  ?

## 2021-04-24 NOTE — Telephone Encounter (Signed)
Thank you :)

## 2021-04-25 DIAGNOSIS — M9902 Segmental and somatic dysfunction of thoracic region: Secondary | ICD-10-CM | POA: Diagnosis not present

## 2021-04-25 DIAGNOSIS — M9904 Segmental and somatic dysfunction of sacral region: Secondary | ICD-10-CM | POA: Diagnosis not present

## 2021-04-25 DIAGNOSIS — M9901 Segmental and somatic dysfunction of cervical region: Secondary | ICD-10-CM | POA: Diagnosis not present

## 2021-04-25 DIAGNOSIS — M9903 Segmental and somatic dysfunction of lumbar region: Secondary | ICD-10-CM | POA: Diagnosis not present

## 2021-04-25 NOTE — Progress Notes (Signed)
Carelink Summary Report / Loop Recorder 

## 2021-05-02 DIAGNOSIS — M9903 Segmental and somatic dysfunction of lumbar region: Secondary | ICD-10-CM | POA: Diagnosis not present

## 2021-05-02 DIAGNOSIS — M9902 Segmental and somatic dysfunction of thoracic region: Secondary | ICD-10-CM | POA: Diagnosis not present

## 2021-05-02 DIAGNOSIS — M9901 Segmental and somatic dysfunction of cervical region: Secondary | ICD-10-CM | POA: Diagnosis not present

## 2021-05-02 DIAGNOSIS — M9904 Segmental and somatic dysfunction of sacral region: Secondary | ICD-10-CM | POA: Diagnosis not present

## 2021-05-05 DIAGNOSIS — M9901 Segmental and somatic dysfunction of cervical region: Secondary | ICD-10-CM | POA: Diagnosis not present

## 2021-05-05 DIAGNOSIS — M9904 Segmental and somatic dysfunction of sacral region: Secondary | ICD-10-CM | POA: Diagnosis not present

## 2021-05-05 DIAGNOSIS — M9902 Segmental and somatic dysfunction of thoracic region: Secondary | ICD-10-CM | POA: Diagnosis not present

## 2021-05-05 DIAGNOSIS — M9903 Segmental and somatic dysfunction of lumbar region: Secondary | ICD-10-CM | POA: Diagnosis not present

## 2021-05-12 DIAGNOSIS — M9904 Segmental and somatic dysfunction of sacral region: Secondary | ICD-10-CM | POA: Diagnosis not present

## 2021-05-12 DIAGNOSIS — M9902 Segmental and somatic dysfunction of thoracic region: Secondary | ICD-10-CM | POA: Diagnosis not present

## 2021-05-12 DIAGNOSIS — M9901 Segmental and somatic dysfunction of cervical region: Secondary | ICD-10-CM | POA: Diagnosis not present

## 2021-05-12 DIAGNOSIS — M9903 Segmental and somatic dysfunction of lumbar region: Secondary | ICD-10-CM | POA: Diagnosis not present

## 2021-05-12 LAB — CUP PACEART REMOTE DEVICE CHECK
Date Time Interrogation Session: 20230510231232
Implantable Pulse Generator Implant Date: 20211020

## 2021-05-15 ENCOUNTER — Ambulatory Visit (INDEPENDENT_AMBULATORY_CARE_PROVIDER_SITE_OTHER): Payer: Medicare HMO

## 2021-05-15 DIAGNOSIS — I639 Cerebral infarction, unspecified: Secondary | ICD-10-CM

## 2021-05-17 DIAGNOSIS — M9902 Segmental and somatic dysfunction of thoracic region: Secondary | ICD-10-CM | POA: Diagnosis not present

## 2021-05-17 DIAGNOSIS — M9901 Segmental and somatic dysfunction of cervical region: Secondary | ICD-10-CM | POA: Diagnosis not present

## 2021-05-17 DIAGNOSIS — M9903 Segmental and somatic dysfunction of lumbar region: Secondary | ICD-10-CM | POA: Diagnosis not present

## 2021-05-17 DIAGNOSIS — M9904 Segmental and somatic dysfunction of sacral region: Secondary | ICD-10-CM | POA: Diagnosis not present

## 2021-05-25 DIAGNOSIS — M9903 Segmental and somatic dysfunction of lumbar region: Secondary | ICD-10-CM | POA: Diagnosis not present

## 2021-05-25 DIAGNOSIS — M9904 Segmental and somatic dysfunction of sacral region: Secondary | ICD-10-CM | POA: Diagnosis not present

## 2021-05-25 DIAGNOSIS — M9902 Segmental and somatic dysfunction of thoracic region: Secondary | ICD-10-CM | POA: Diagnosis not present

## 2021-05-25 DIAGNOSIS — M9901 Segmental and somatic dysfunction of cervical region: Secondary | ICD-10-CM | POA: Diagnosis not present

## 2021-05-31 DIAGNOSIS — M9903 Segmental and somatic dysfunction of lumbar region: Secondary | ICD-10-CM | POA: Diagnosis not present

## 2021-05-31 DIAGNOSIS — M9902 Segmental and somatic dysfunction of thoracic region: Secondary | ICD-10-CM | POA: Diagnosis not present

## 2021-05-31 DIAGNOSIS — M9901 Segmental and somatic dysfunction of cervical region: Secondary | ICD-10-CM | POA: Diagnosis not present

## 2021-05-31 DIAGNOSIS — M9904 Segmental and somatic dysfunction of sacral region: Secondary | ICD-10-CM | POA: Diagnosis not present

## 2021-06-01 NOTE — Progress Notes (Signed)
Carelink Summary Report / Loop Recorder 

## 2021-06-07 DIAGNOSIS — M9902 Segmental and somatic dysfunction of thoracic region: Secondary | ICD-10-CM | POA: Diagnosis not present

## 2021-06-07 DIAGNOSIS — M9903 Segmental and somatic dysfunction of lumbar region: Secondary | ICD-10-CM | POA: Diagnosis not present

## 2021-06-07 DIAGNOSIS — M9904 Segmental and somatic dysfunction of sacral region: Secondary | ICD-10-CM | POA: Diagnosis not present

## 2021-06-07 DIAGNOSIS — M9901 Segmental and somatic dysfunction of cervical region: Secondary | ICD-10-CM | POA: Diagnosis not present

## 2021-06-14 DIAGNOSIS — M9903 Segmental and somatic dysfunction of lumbar region: Secondary | ICD-10-CM | POA: Diagnosis not present

## 2021-06-14 DIAGNOSIS — M9901 Segmental and somatic dysfunction of cervical region: Secondary | ICD-10-CM | POA: Diagnosis not present

## 2021-06-14 DIAGNOSIS — M9904 Segmental and somatic dysfunction of sacral region: Secondary | ICD-10-CM | POA: Diagnosis not present

## 2021-06-14 DIAGNOSIS — M9902 Segmental and somatic dysfunction of thoracic region: Secondary | ICD-10-CM | POA: Diagnosis not present

## 2021-06-16 ENCOUNTER — Encounter: Payer: Self-pay | Admitting: Psychology

## 2021-06-19 ENCOUNTER — Telehealth: Payer: Self-pay | Admitting: Neurology

## 2021-06-19 ENCOUNTER — Ambulatory Visit (INDEPENDENT_AMBULATORY_CARE_PROVIDER_SITE_OTHER): Payer: Medicare HMO

## 2021-06-19 DIAGNOSIS — I639 Cerebral infarction, unspecified: Secondary | ICD-10-CM | POA: Diagnosis not present

## 2021-06-19 LAB — CUP PACEART REMOTE DEVICE CHECK
Date Time Interrogation Session: 20230612230522
Implantable Pulse Generator Implant Date: 20211020

## 2021-06-19 NOTE — Telephone Encounter (Signed)
Unable to leave VM due to VM box not being set up, left mychart msg to reschedule appointment for 8/21 - MD out

## 2021-06-20 ENCOUNTER — Encounter: Payer: Medicare HMO | Attending: Psychology | Admitting: Psychology

## 2021-06-22 ENCOUNTER — Encounter: Payer: Medicare HMO | Admitting: Psychology

## 2021-06-26 ENCOUNTER — Telehealth: Payer: Self-pay | Admitting: Emergency Medicine

## 2021-06-26 DIAGNOSIS — I6300 Cerebral infarction due to thrombosis of unspecified precerebral artery: Secondary | ICD-10-CM

## 2021-06-28 DIAGNOSIS — M9904 Segmental and somatic dysfunction of sacral region: Secondary | ICD-10-CM | POA: Diagnosis not present

## 2021-06-28 DIAGNOSIS — M9903 Segmental and somatic dysfunction of lumbar region: Secondary | ICD-10-CM | POA: Diagnosis not present

## 2021-06-28 DIAGNOSIS — M9902 Segmental and somatic dysfunction of thoracic region: Secondary | ICD-10-CM | POA: Diagnosis not present

## 2021-06-28 DIAGNOSIS — M9901 Segmental and somatic dysfunction of cervical region: Secondary | ICD-10-CM | POA: Diagnosis not present

## 2021-07-03 DIAGNOSIS — I6309 Cerebral infarction due to thrombosis of other precerebral artery: Secondary | ICD-10-CM | POA: Diagnosis not present

## 2021-07-06 NOTE — Progress Notes (Signed)
Carelink Summary Report / Loop Recorder 

## 2021-07-18 DIAGNOSIS — M9902 Segmental and somatic dysfunction of thoracic region: Secondary | ICD-10-CM | POA: Diagnosis not present

## 2021-07-18 DIAGNOSIS — M9903 Segmental and somatic dysfunction of lumbar region: Secondary | ICD-10-CM | POA: Diagnosis not present

## 2021-07-18 DIAGNOSIS — M9901 Segmental and somatic dysfunction of cervical region: Secondary | ICD-10-CM | POA: Diagnosis not present

## 2021-07-18 DIAGNOSIS — M9904 Segmental and somatic dysfunction of sacral region: Secondary | ICD-10-CM | POA: Diagnosis not present

## 2021-07-23 LAB — CUP PACEART REMOTE DEVICE CHECK
Date Time Interrogation Session: 20230715230131
Implantable Pulse Generator Implant Date: 20211020

## 2021-07-24 ENCOUNTER — Ambulatory Visit (INDEPENDENT_AMBULATORY_CARE_PROVIDER_SITE_OTHER): Payer: Medicare HMO

## 2021-07-24 DIAGNOSIS — I639 Cerebral infarction, unspecified: Secondary | ICD-10-CM | POA: Diagnosis not present

## 2021-07-27 DIAGNOSIS — M9901 Segmental and somatic dysfunction of cervical region: Secondary | ICD-10-CM | POA: Diagnosis not present

## 2021-07-27 DIAGNOSIS — M9904 Segmental and somatic dysfunction of sacral region: Secondary | ICD-10-CM | POA: Diagnosis not present

## 2021-07-27 DIAGNOSIS — M9903 Segmental and somatic dysfunction of lumbar region: Secondary | ICD-10-CM | POA: Diagnosis not present

## 2021-07-27 DIAGNOSIS — M9902 Segmental and somatic dysfunction of thoracic region: Secondary | ICD-10-CM | POA: Diagnosis not present

## 2021-07-29 ENCOUNTER — Other Ambulatory Visit: Payer: Self-pay

## 2021-07-29 ENCOUNTER — Emergency Department (HOSPITAL_COMMUNITY): Payer: Medicare HMO

## 2021-07-29 ENCOUNTER — Inpatient Hospital Stay (HOSPITAL_COMMUNITY)
Admission: EM | Admit: 2021-07-29 | Discharge: 2021-08-02 | DRG: 493 | Disposition: A | Discharge status: Home Health | Payer: Medicare HMO | Attending: Internal Medicine | Admitting: Internal Medicine

## 2021-07-29 ENCOUNTER — Encounter (HOSPITAL_COMMUNITY): Payer: Self-pay

## 2021-07-29 DIAGNOSIS — D62 Acute posthemorrhagic anemia: Secondary | ICD-10-CM | POA: Diagnosis not present

## 2021-07-29 DIAGNOSIS — Z79899 Other long term (current) drug therapy: Secondary | ICD-10-CM | POA: Diagnosis not present

## 2021-07-29 DIAGNOSIS — M7989 Other specified soft tissue disorders: Secondary | ICD-10-CM | POA: Diagnosis not present

## 2021-07-29 DIAGNOSIS — D649 Anemia, unspecified: Secondary | ICD-10-CM | POA: Diagnosis not present

## 2021-07-29 DIAGNOSIS — D696 Thrombocytopenia, unspecified: Secondary | ICD-10-CM | POA: Diagnosis not present

## 2021-07-29 DIAGNOSIS — S51032A Puncture wound without foreign body of left elbow, initial encounter: Secondary | ICD-10-CM | POA: Diagnosis not present

## 2021-07-29 DIAGNOSIS — M25562 Pain in left knee: Secondary | ICD-10-CM | POA: Diagnosis not present

## 2021-07-29 DIAGNOSIS — S42409A Unspecified fracture of lower end of unspecified humerus, initial encounter for closed fracture: Secondary | ICD-10-CM | POA: Diagnosis present

## 2021-07-29 DIAGNOSIS — S51012A Laceration without foreign body of left elbow, initial encounter: Secondary | ICD-10-CM | POA: Diagnosis not present

## 2021-07-29 DIAGNOSIS — Y92830 Public park as the place of occurrence of the external cause: Secondary | ICD-10-CM | POA: Diagnosis not present

## 2021-07-29 DIAGNOSIS — Z7982 Long term (current) use of aspirin: Secondary | ICD-10-CM | POA: Diagnosis not present

## 2021-07-29 DIAGNOSIS — Z23 Encounter for immunization: Secondary | ICD-10-CM

## 2021-07-29 DIAGNOSIS — Z7902 Long term (current) use of antithrombotics/antiplatelets: Secondary | ICD-10-CM | POA: Diagnosis not present

## 2021-07-29 DIAGNOSIS — S42402B Unspecified fracture of lower end of left humerus, initial encounter for open fracture: Secondary | ICD-10-CM | POA: Diagnosis not present

## 2021-07-29 DIAGNOSIS — M810 Age-related osteoporosis without current pathological fracture: Secondary | ICD-10-CM | POA: Diagnosis present

## 2021-07-29 DIAGNOSIS — I951 Orthostatic hypotension: Secondary | ICD-10-CM

## 2021-07-29 DIAGNOSIS — F0390 Unspecified dementia without behavioral disturbance: Secondary | ICD-10-CM | POA: Diagnosis not present

## 2021-07-29 DIAGNOSIS — S42412A Displaced simple supracondylar fracture without intercondylar fracture of left humerus, initial encounter for closed fracture: Secondary | ICD-10-CM | POA: Diagnosis not present

## 2021-07-29 DIAGNOSIS — Z888 Allergy status to other drugs, medicaments and biological substances status: Secondary | ICD-10-CM | POA: Diagnosis not present

## 2021-07-29 DIAGNOSIS — W19XXXA Unspecified fall, initial encounter: Principal | ICD-10-CM

## 2021-07-29 DIAGNOSIS — F32A Depression, unspecified: Secondary | ICD-10-CM | POA: Diagnosis not present

## 2021-07-29 DIAGNOSIS — G8918 Other acute postprocedural pain: Secondary | ICD-10-CM | POA: Diagnosis not present

## 2021-07-29 DIAGNOSIS — I9581 Postprocedural hypotension: Secondary | ICD-10-CM | POA: Diagnosis not present

## 2021-07-29 DIAGNOSIS — M898X2 Other specified disorders of bone, upper arm: Secondary | ICD-10-CM | POA: Diagnosis not present

## 2021-07-29 DIAGNOSIS — W010XXA Fall on same level from slipping, tripping and stumbling without subsequent striking against object, initial encounter: Secondary | ICD-10-CM | POA: Diagnosis present

## 2021-07-29 DIAGNOSIS — Z8249 Family history of ischemic heart disease and other diseases of the circulatory system: Secondary | ICD-10-CM

## 2021-07-29 DIAGNOSIS — M79602 Pain in left arm: Secondary | ICD-10-CM | POA: Diagnosis not present

## 2021-07-29 DIAGNOSIS — Y9301 Activity, walking, marching and hiking: Secondary | ICD-10-CM | POA: Diagnosis present

## 2021-07-29 DIAGNOSIS — R230 Cyanosis: Secondary | ICD-10-CM | POA: Diagnosis not present

## 2021-07-29 DIAGNOSIS — Z8673 Personal history of transient ischemic attack (TIA), and cerebral infarction without residual deficits: Secondary | ICD-10-CM

## 2021-07-29 DIAGNOSIS — S42492B Other displaced fracture of lower end of left humerus, initial encounter for open fracture: Secondary | ICD-10-CM | POA: Diagnosis not present

## 2021-07-29 DIAGNOSIS — F0150 Vascular dementia without behavioral disturbance: Secondary | ICD-10-CM | POA: Diagnosis present

## 2021-07-29 DIAGNOSIS — M898X9 Other specified disorders of bone, unspecified site: Secondary | ICD-10-CM | POA: Diagnosis present

## 2021-07-29 DIAGNOSIS — Z91013 Allergy to seafood: Secondary | ICD-10-CM | POA: Diagnosis not present

## 2021-07-29 DIAGNOSIS — S42402A Unspecified fracture of lower end of left humerus, initial encounter for closed fracture: Secondary | ICD-10-CM | POA: Diagnosis not present

## 2021-07-29 DIAGNOSIS — S42402D Unspecified fracture of lower end of left humerus, subsequent encounter for fracture with routine healing: Secondary | ICD-10-CM | POA: Diagnosis not present

## 2021-07-29 DIAGNOSIS — I10 Essential (primary) hypertension: Secondary | ICD-10-CM | POA: Diagnosis present

## 2021-07-29 DIAGNOSIS — F419 Anxiety disorder, unspecified: Secondary | ICD-10-CM | POA: Diagnosis not present

## 2021-07-29 DIAGNOSIS — R001 Bradycardia, unspecified: Secondary | ICD-10-CM | POA: Diagnosis not present

## 2021-07-29 DIAGNOSIS — M21372 Foot drop, left foot: Secondary | ICD-10-CM | POA: Diagnosis present

## 2021-07-29 LAB — CBC WITH DIFFERENTIAL/PLATELET
Abs Immature Granulocytes: 0.06 10*3/uL (ref 0.00–0.07)
Basophils Absolute: 0.1 10*3/uL (ref 0.0–0.1)
Basophils Relative: 1 %
Eosinophils Absolute: 0 10*3/uL (ref 0.0–0.5)
Eosinophils Relative: 0 %
HCT: 35.9 % — ABNORMAL LOW (ref 36.0–46.0)
Hemoglobin: 11.6 g/dL — ABNORMAL LOW (ref 12.0–15.0)
Immature Granulocytes: 1 %
Lymphocytes Relative: 7 %
Lymphs Abs: 0.8 10*3/uL (ref 0.7–4.0)
MCH: 30.3 pg (ref 26.0–34.0)
MCHC: 32.3 g/dL (ref 30.0–36.0)
MCV: 93.7 fL (ref 80.0–100.0)
Monocytes Absolute: 0.6 10*3/uL (ref 0.1–1.0)
Monocytes Relative: 5 %
Neutro Abs: 10 10*3/uL — ABNORMAL HIGH (ref 1.7–7.7)
Neutrophils Relative %: 86 %
Platelets: 209 10*3/uL (ref 150–400)
RBC: 3.83 MIL/uL — ABNORMAL LOW (ref 3.87–5.11)
RDW: 14.1 % (ref 11.5–15.5)
WBC: 11.6 10*3/uL — ABNORMAL HIGH (ref 4.0–10.5)
nRBC: 0 % (ref 0.0–0.2)

## 2021-07-29 LAB — BASIC METABOLIC PANEL
Anion gap: 9 (ref 5–15)
BUN: 25 mg/dL — ABNORMAL HIGH (ref 8–23)
CO2: 22 mmol/L (ref 22–32)
Calcium: 8.7 mg/dL — ABNORMAL LOW (ref 8.9–10.3)
Chloride: 105 mmol/L (ref 98–111)
Creatinine, Ser: 0.97 mg/dL (ref 0.44–1.00)
GFR, Estimated: >60 mL/min (ref >60)
Glucose, Bld: 112 mg/dL — ABNORMAL HIGH (ref 70–99)
Potassium: 4.3 mmol/L (ref 3.5–5.1)
Sodium: 136 mmol/L (ref 135–145)

## 2021-07-29 MED ORDER — ENOXAPARIN SODIUM 40 MG/0.4ML IJ SOSY
40.0000 mg | PREFILLED_SYRINGE | INTRAMUSCULAR | Status: DC
  Administered 2021-07-30 – 2021-08-02 (×3): 40 mg via SUBCUTANEOUS
  Filled 2021-07-29 (×3): qty 0.4

## 2021-07-29 MED ORDER — CEFAZOLIN SODIUM-DEXTROSE 2-4 GM/100ML-% IV SOLN
2.0000 g | INTRAVENOUS | Status: AC
  Administered 2021-07-29: 2 g via INTRAVENOUS
  Filled 2021-07-29: qty 100

## 2021-07-29 MED ORDER — ASPIRIN 81 MG PO TBEC
162.0000 mg | DELAYED_RELEASE_TABLET | Freq: Every day | ORAL | Status: DC
  Administered 2021-07-30 – 2021-08-01 (×2): 162 mg via ORAL
  Filled 2021-07-29 (×2): qty 2

## 2021-07-29 MED ORDER — SENNOSIDES-DOCUSATE SODIUM 8.6-50 MG PO TABS: 1.0000 | ORAL_TABLET | Freq: Every evening | ORAL | Status: DC | PRN

## 2021-07-29 MED ORDER — DONEPEZIL HCL 10 MG PO TABS
10.0000 mg | ORAL_TABLET | Freq: Every day | ORAL | Status: DC
  Administered 2021-07-31 – 2021-08-01 (×2): 10 mg via ORAL
  Filled 2021-07-29 (×4): qty 1

## 2021-07-29 MED ORDER — CEFAZOLIN SODIUM-DEXTROSE 2-4 GM/100ML-% IV SOLN
2.0000 g | Freq: Three times a day (TID) | INTRAVENOUS | Status: DC
Start: 2021-07-29 — End: 2021-08-02
  Administered 2021-07-29 – 2021-08-02 (×8): 2 g via INTRAVENOUS
  Filled 2021-07-29 (×8): qty 100

## 2021-07-29 MED ORDER — ONDANSETRON HCL 4 MG/2ML IJ SOLN
4.0000 mg | Freq: Four times a day (QID) | INTRAMUSCULAR | Status: DC | PRN
Start: 2021-07-29 — End: 2021-08-02

## 2021-07-29 MED ORDER — ATORVASTATIN CALCIUM 40 MG PO TABS
40.0000 mg | ORAL_TABLET | Freq: Every day | ORAL | Status: DC
  Administered 2021-07-30 – 2021-08-01 (×3): 40 mg via ORAL
  Filled 2021-07-29 (×3): qty 1

## 2021-07-29 MED ORDER — SODIUM CHLORIDE 0.9 % IV BOLUS
1000.0000 mL | Freq: Once | INTRAVENOUS | Status: AC
Start: 2021-07-29 — End: 2021-07-29
  Administered 2021-07-29: 1000 mL via INTRAVENOUS

## 2021-07-29 MED ORDER — TETANUS-DIPHTH-ACELL PERTUSSIS 5-2.5-18.5 LF-MCG/0.5 IM SUSY
0.5000 mL | PREFILLED_SYRINGE | Freq: Once | INTRAMUSCULAR | Status: AC
Start: 2021-07-29 — End: 2021-07-29
  Administered 2021-07-29: 0.5 mL via INTRAMUSCULAR
  Filled 2021-07-29: qty 0.5

## 2021-07-29 MED ORDER — DOCUSATE SODIUM 100 MG PO CAPS: 100.0000 mg | ORAL_CAPSULE | Freq: Two times a day (BID) | ORAL | Status: DC | PRN

## 2021-07-29 MED ORDER — LOSARTAN POTASSIUM 25 MG PO TABS
12.5000 mg | ORAL_TABLET | Freq: Every day | ORAL | Status: DC
  Administered 2021-07-30: 12.5 mg via ORAL
  Filled 2021-07-29: qty 1

## 2021-07-29 MED ORDER — MORPHINE SULFATE (PF) 4 MG/ML IV SOLN
4.0000 mg | Freq: Once | INTRAVENOUS | Status: AC
  Administered 2021-07-29: 4 mg via INTRAVENOUS
  Filled 2021-07-29: qty 1

## 2021-07-29 MED ORDER — MELATONIN 5 MG PO TABS
5.0000 mg | ORAL_TABLET | Freq: Every evening | ORAL | Status: DC | PRN
  Administered 2021-08-01 (×2): 5 mg via ORAL
  Filled 2021-07-29 (×2): qty 1

## 2021-07-29 MED ORDER — ONDANSETRON HCL 4 MG/2ML IJ SOLN
4.0000 mg | Freq: Once | INTRAMUSCULAR | Status: AC
Start: 2021-07-29 — End: 2021-07-29
  Administered 2021-07-29: 4 mg via INTRAVENOUS
  Filled 2021-07-29: qty 2

## 2021-07-29 MED ORDER — OXYCODONE HCL 5 MG PO TABS
5.0000 mg | ORAL_TABLET | Freq: Four times a day (QID) | ORAL | Status: DC | PRN
  Administered 2021-07-29 – 2021-08-01 (×4): 5 mg via ORAL
  Filled 2021-07-29 (×6): qty 1

## 2021-07-29 MED ORDER — LIDOCAINE-EPINEPHRINE (PF) 2 %-1:200000 IJ SOLN
10.0000 mL | Freq: Once | INTRAMUSCULAR | Status: AC
  Administered 2021-07-29: 10 mL
  Filled 2021-07-29: qty 20

## 2021-07-29 MED ORDER — HYDROMORPHONE HCL 1 MG/ML IJ SOLN
0.5000 mg | INTRAMUSCULAR | Status: DC | PRN
  Administered 2021-07-29 – 2021-07-30 (×2): 0.5 mg via INTRAVENOUS
  Filled 2021-07-29 (×2): qty 0.5

## 2021-07-29 MED ORDER — PANTOPRAZOLE SODIUM 40 MG PO TBEC
40.0000 mg | DELAYED_RELEASE_TABLET | Freq: Every day | ORAL | Status: DC
  Administered 2021-07-29 – 2021-08-02 (×5): 40 mg via ORAL
  Filled 2021-07-29 (×5): qty 1

## 2021-07-29 MED ORDER — BISACODYL 5 MG PO TBEC: 5.0000 mg | DELAYED_RELEASE_TABLET | Freq: Every day | ORAL | Status: DC | PRN

## 2021-07-29 NOTE — H&P (Signed)
History and Physical    Jacqueline GALLOGLY LPF:[id number] DOB: 12/31/1955 DOA: 07/29/2021  PCP: Horald Pollen, MD (Confirm with patient/family/NH records and if not entered, this has to be entered at [facility] point of entry) Patient coming from: Home  I have personally briefly reviewed patient's old medical records in [place]  Chief Complaint: Elbow pain  HPI: Jacqueline Bonilla is a 66 y.o. 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  Patient tripped on a piece of stone this morning while taking a walk around her house, denies any new weakness of any other arm or legs, no loss of consciousness no head injuries.  She did hit her left elbow on the ground and excruciating pain afterwards.  Also developed a large hematoma around left elbow and severe pain tenderness.  ED Course: Afebrile, no hypotension no tachycardia.  CT left elbow showed comminuted left elbow fracture.  Review of Systems: As per HPI otherwise 14 point review of systems negative.    Past Medical History:  Diagnosis Date   Anemia    Anxiety    Depression    Dysmenorrhea    Endometriosis    Fibroid    Hypertension    Microhematuria    negative workup   Osteoporosis    Tachycardia    Thrombocytopenia ([place])     Past Surgical History:  Procedure Laterality Date   BREAST BIOPSY     BUBBLE STUDY  12/08/2020   Procedure: BUBBLE STUDY;  Surgeon: Josue Hector, MD;  Location: [facility] ENDOSCOPY;  Service: Cardiovascular;;   CESAREAN SECTION     hysteroscopic resection     implantable loop recorder implant  10/21/2019   Medtronic Reveal Linq model LNQ 22 ([place] [id number]) implantable loop recorder   IR CT HEAD LTD  12/01/2020   IR PERCUTANEOUS ART THROMBECTOMY/INFUSION INTRACRANIAL INC DIAG ANGIO  12/01/2020   IR US GUIDE VASC  ACCESS RIGHT  12/01/2020   RADIOLOGY WITH ANESTHESIA N/A 12/01/2020   Procedure: IR WITH ANESTHESIA - CODE STROKE;  Surgeon: Radiologist, Medication, MD;  Location: [place];  Service: Radiology;  Laterality: N/A;   TEE WITHOUT CARDIOVERSION N/A 12/08/2020   Procedure: TRANSESOPHAGEAL ECHOCARDIOGRAM (TEE);  Surgeon: Josue Hector, MD;  Location: [facility] ENDOSCOPY;  Service: Cardiovascular;  Laterality: N/A;     reports that she has never smoked. She has never used smokeless tobacco. She reports that she does not drink alcohol and does not use drugs.  Allergies  Allergen Reactions   Avelox [Moxifloxacin]    Ciprofloxacin Hives   Floraquin [Iodoquinol]     No cipro or others   Iohexol      Code: HIVES, Desc: HIVES S/P IVP MANY YRS AGO    Levaquin [Levofloxacin]    Plavix [Clopidogrel] Diarrhea   Quinolones    Shellfish Allergy Itching    Itching under the chin. Described by patient but not seen by husband.    Family History  Problem Relation Age of Onset   Cancer Father        non hodgkin lymphoma & skin   Heart attack Maternal Grandfather    Dementia Mother    Polymyositis Sister    4  Prior to Admission medications   Medication Sig Start Date End Date Taking? Authorizing Provider  Amino Acids Complex TABS Take 1 tablet by mouth daily.  Yes [provider]  aspirin EC 81 MG tablet Take 81 mg by mouth daily. Swallow whole.   Yes [provider]  BRILINTA 90 MG TABS tablet Take 90 mg by mouth 2 (two) times daily. 07/20/21  Yes [provider]  CINNAMON PO Take 1 capsule by mouth daily.   Yes [provider]  Coenzyme Q10 (CO Q 10 PO) Take 1 capsule by mouth daily.   Yes [provider]  losartan (COZAAR) 25 MG tablet Take 0.5 tablets (12.5 mg total) by mouth daily. 04/24/21  Yes Sagardia, Ines Bloomer, MD  MAGNESIUM PO Take 1 tablet by mouth at bedtime.   Yes [provider]  Multiple Vitamin (MULTIVITAMIN WITH MINERALS) TABS tablet  Take 1 tablet by mouth daily.   Yes [provider]  Multiple Vitamins-Minerals (ZINC PO) Take 1 tablet by mouth daily.   Yes [provider]  atorvastatin (LIPITOR) 40 MG tablet Take 1 tablet (40 mg total) by mouth daily. Patient not taking: Reported on 07/29/2021 12/15/20   Angiulli, Lavon Paganini, PA-C  donepezil (ARICEPT) 10 MG tablet Take 1 tablet (10 mg total) by mouth at bedtime. Patient not taking: Reported on 07/29/2021 02/21/21   [name], MD  melatonin 5 MG TABS Take 1 tablet (5 mg total) by mouth at bedtime as needed. Patient not taking: Reported on 07/29/2021 12/15/20   Angiulli, Lavon Paganini, PA-C  pantoprazole (PROTONIX) 40 MG tablet Take 1 tablet (40 mg total) by mouth daily. Patient not taking: Reported on 07/29/2021 12/15/20   Angiulli, Lavon Paganini, PA-C  thiamine 100 MG tablet Take 1 tablet (100 mg total) by mouth daily. Patient not taking: Reported on 07/29/2021 12/15/20   Cathlyn Parsons, PA-C    Physical Exam: Vitals:   07/29/21 1345 07/29/21 1400 07/29/21 1415 07/29/21 1554  BP: 137/79 (!) 150/79 (!) 147/82   Pulse: 73 73 80   Resp: '13 16 12   '$ Temp:    (!) 97.5 F (36.4 C)  TempSrc:    Oral  SpO2: 100% 100% 100%     Constitutional: NAD, calm, comfortable Vitals:   07/29/21 1345 07/29/21 1400 07/29/21 1415 07/29/21 1554  BP: 137/79 (!) 150/79 (!) 147/82   Pulse: 73 73 80   Resp: '13 16 12   '$ Temp:    (!) 97.5 F (36.4 C)  TempSrc:    Oral  SpO2: 100% 100% 100%    Eyes: PERRL, lids and conjunctivae normal ENMT: Mucous membranes are moist. Posterior pharynx clear of any exudate or lesions.Normal dentition.  Neck: normal, supple, no masses, no thyromegaly Respiratory: clear to auscultation bilaterally, no wheezing, no crackles. Normal respiratory effort. No accessory muscle use.  Cardiovascular: Regular rate and rhythm, no murmurs / rubs / gallops. No extremity edema. 2+ pedal pulses. No carotid bruits.  Abdomen: no tenderness, no masses palpated.  No hepatosplenomegaly. Bowel sounds positive.  Musculoskeletal: no clubbing / cyanosis.  Left elbow swelling and pain and reduced ROM, left 5 fingers capillary refill brisk, moving all left hand and 5 fingers freely Skin: no rashes, lesions, ulcers. No induration.  Several shallow abrasions of lateral side of left elbow Neurologic: CN 2-12 grossly intact. Sensation intact, DTR normal.  Chronic left foot weakness and left-sided vision deficit.   Psychiatric: Normal judgment and insight. Alert and oriented x 3. Normal mood.     Labs on Admission: I have personally reviewed following labs and imaging studies  CBC: Recent Labs  Lab 07/29/21 1257  WBC 11.6*  NEUTROABS 10.0*  HGB 11.6*  HCT 35.9*  MCV 93.7  PLT 409   Basic Metabolic Panel: Recent Labs  Lab 07/29/21 1257  NA 136  K 4.3  CL 105  CO2 22  GLUCOSE 112*  BUN 25*  CREATININE 0.97  CALCIUM 8.7*   GFR: CrCl cannot be calculated (Unknown ideal weight.). Liver Function Tests: No results for input(s): "AST", "ALT", "ALKPHOS", "BILITOT", "PROT", "ALBUMIN" in the last 168 hours. No results for input(s): "LIPASE", "AMYLASE" in the last 168 hours. No results for input(s): "AMMONIA" in the last 168 hours. Coagulation Profile: No results for input(s): "INR", "PROTIME" in the last 168 hours. Cardiac Enzymes: No results for input(s): "CKTOTAL", "CKMB", "CKMBINDEX", "TROPONINI" in the last 168 hours. BNP (last 3 results) No results for input(s): "PROBNP" in the last 8760 hours. HbA1C: No results for input(s): "HGBA1C" in the last 72 hours. CBG: No results for input(s): "GLUCAP" in the last 168 hours. Lipid Profile: No results for input(s): "CHOL", "HDL", "LDLCALC", "TRIG", "CHOLHDL", "LDLDIRECT" in the last 72 hours. Thyroid Function Tests: No results for input(s): "TSH", "T4TOTAL", "FREET4", "T3FREE", "THYROIDAB" in the last 72 hours. Anemia Panel: No results for input(s): "VITAMINB12", "FOLATE", "FERRITIN", "TIBC",  "IRON", "RETICCTPCT" in the last 72 hours. Urine analysis:    Component Value Date/Time   APPEARANCEUR Clear 08/08/2017 0915   GLUCOSEU Negative 08/08/2017 0915   BILIRUBINUR negative 07/20/2019 0939   BILIRUBINUR Negative 08/08/2017 0915   KETONESUR negative 07/20/2019 0939   PROTEINUR negative 07/20/2019 0939   PROTEINUR Negative 08/08/2017 0915   UROBILINOGEN 0.2 07/20/2019 0939   NITRITE Negative 07/20/2019 0939   NITRITE Negative 08/08/2017 0915   LEUKOCYTESUR Small (1+) (A) 07/20/2019 0939   LEUKOCYTESUR Negative 08/08/2017 0915    Radiological Exams on Admission: CT ELBOW LEFT WO CONTRAST  Result Date: 07/29/2021 CLINICAL DATA:  Left elbow fracture EXAM: CT OF THE UPPER LEFT EXTREMITY WITHOUT CONTRAST TECHNIQUE: Multidetector CT imaging of the upper left extremity was performed according to the standard protocol. RADIATION DOSE REDUCTION: This exam was performed according to the departmental dose-optimization program which includes automated exposure control, adjustment of the mA and/or kV according to patient size and/or use of iterative reconstruction technique. COMPARISON:  X-ray 07/29/2021 FINDINGS: Bones/Joint/Cartilage Acute heavily comminuted fracture of the distal humerus. Overall fracture alignment is significantly laterally displaced and mildly angulated. Relatively nondisplaced intra-articular component within the trochlea centrally. Supracondylar and transcondylar components with numerous displaced and angulated fracture fragments. There is a skin wound medially with air present within the soft tissues and within the joint compatible with open fracture and traumatic arthrotomy. Elbow joint alignment is maintained without dislocation. The proximal radius and ulna appear intact without evidence of fracture. Moderate-sized elbow joint hemarthrosis. Ligaments Suboptimally assessed by CT. Muscles and Tendons Suboptimally assessed but suspected posttraumatic intramuscular hematoma  about the elbow with enlarged, heterogeneous appearance of the muscles. Soft tissues Diffuse soft tissue swelling about the elbow with medial skin wound and associated soft tissue air. IMPRESSION: 1. Acute heavily comminuted displaced and angulated intra-articular fracture of the distal humerus, as described above. 2. Moderate-sized elbow joint hemarthrosis. 3. Diffuse soft tissue swelling about the elbow with medial skin wound and associated soft tissue air compatible with open fracture and traumatic arthrotomy. Electronically Signed   By: Davina Poke D.O.   On: 07/29/2021 16:13   DG Humerus Left  Result Date: 07/29/2021 CLINICAL DATA:  Fall with LEFT arm pain. EXAM: LEFT HUMERUS - 2 VIEW COMPARISON:  None Available. FINDINGS: A comminuted  displaced intra-articular distal humeral fracture is identified. Limited evaluation secondary to patient inability to position. The proximal radius and ulna or difficult to fully evaluate on this study. IMPRESSION: 1. Comminuted displaced intra-articular distal humeral fracture. 2. Proximal radius and ulna not fully evaluated on this study. Dedicated elbow study is recommended for further evaluation. Electronically Signed   By: Margarette Canada M.D.   On: 07/29/2021 12:25    EKG: Ordered  Assessment/Plan Principal Problem:   Elbow fracture Active Problems:   Elbow fracture, left, closed, initial encounter  (please populate well all problems here in Problem List. (For example, if patient is on BP meds at home and you resume or decide to hold them, it is a problem that needs to be her. Same for CAD, COPD, HLD and so on)  Left elbow comminuted fracture -Orthopedic surgery consultation appreciated, on long-arm immobilizer for now.  Scheduled ORIF Monday. -Diet, pain control -No symptoms of compartment syndrome or peripheral nerve injuries, will closely monitoring -With several small skin openings, antibiotics as per orthopedic surgery.  Patient received Tdap  booster.  Recurrent cryptogenic stroke -With stable residual weakness of left foot right arm and right arm and hand and vision field deficit -Patient feels today's fall caused by her tripping on the small moving pebble, she denies any new weakness or vision field problem. -Reviewed with patient has had coordination problems with ambulation especially on turning and position changes and had a several near falls lately, but neither patient herself nor husband feels patient requires PT evaluation this point. -Given the incoming surgery, decided to hold Brilinta, explained to patient, who accepted the plan.  Increase aspirin to 162 mg daily  HTN -Stable, continue losartan  DVT prophylaxis: Lovenox Code Status: Full code Family Communication: Husband at bedside Disposition Plan: Sick with elbow fracture requiring ORIF, expect more than 2 midnight hospital stay Consults called: Orthopedic surgery Admission status: MedSurg admission   Lequita Halt MD [facility] Pager [phone]  07/29/2021, 4:17 PM

## 2021-07-29 NOTE — Progress Notes (Signed)
Pharmacy Antibiotic Note  Jacqueline Bonilla is a 66 y.o. female for which pharmacy has been consulted for cefazolin dosing for  wound infection .  SCr 0.97 - CrCl ~45 ml/min WBC 11.6; T 97.5 F; HR 80; RR 12>16  Plan: Cefazolin 2g q8h Trend WBC, Fever, Renal function, & Clinical course F/u cultures, clinical course, WBC, fever De-escalate when able     Temp (24hrs), Avg:97.6 F (36.4 C), Min:97.6 F (36.4 C), Max:97.6 F (36.4 C)  Recent Labs  Lab 07/29/21 1257  WBC 11.6*  CREATININE 0.97    CrCl cannot be calculated (Unknown ideal weight.).    Allergies  Allergen Reactions   Avelox [Moxifloxacin]    Ciprofloxacin Hives   Floraquin [Iodoquinol]     No cipro or others   Iohexol      Code: HIVES, Desc: HIVES S/P IVP MANY YRS AGO    Levaquin [Levofloxacin]    Plavix [Clopidogrel] Diarrhea   Quinolones    Shellfish Allergy Itching    Itching under the chin. Described by patient but not seen by husband.    Antimicrobials this admission: cefazolin 7/29 >>  Microbiology results: Pending  Thank you for allowing pharmacy to be a part of this patient's care.  Lorelei Pont, PharmD, BCPS 07/29/2021 3:11 PM ED Clinical Pharmacist -  [phone]

## 2021-07-29 NOTE — Progress Notes (Signed)
Orthopedic Tech Progress Note Patient Details:  Jacqueline Bonilla [birth year] [id number]  Ortho Devices Type of Ortho Device: Long arm splint Ortho Device/Splint Location: LUE Ortho Device/Splint Interventions: Application   Post Interventions Patient Tolerated: Well  [name] 07/29/2021, 2:30 PM

## 2021-07-29 NOTE — ED Provider Notes (Signed)
..  Laceration Repair  Date/Time: 07/29/2021 2:12 PM  Performed by: Lorin Glass, PA-C Authorized by: Lorin Glass, PA-C   Consent:    Consent obtained:  Verbal   Consent given by:  Patient   Risks, benefits, and alternatives were discussed: yes     Risks discussed:  Infection, need for additional repair, poor cosmetic result, pain, retained foreign body, tendon damage, vascular damage, poor wound healing and nerve damage   Alternatives discussed:  No treatment and referral (Alternative wound closures) Universal protocol:    Procedure explained and questions answered to patient or proxy's satisfaction: yes   Anesthesia:    Anesthesia method:  Local infiltration   Local anesthetic:  Lidocaine 2% WITH epi Laceration details:    Location:  Shoulder/arm   Shoulder/arm location:  L elbow   Length (cm):  1 Pre-procedure details:    Preparation:  Patient was prepped and draped in usual sterile fashion and imaging obtained to evaluate for foreign bodies Exploration:    Wound exploration comment:  No obvious foreign body Treatment:    Area cleansed with:  Povidone-iodine and saline   Amount of cleaning:  Extensive   Irrigation solution:  Sterile saline   Irrigation method:  Syringe Skin repair:    Repair method:  Sutures   Suture size:  3-0   Suture material:  Nylon   Suture technique:  Simple interrupted   Number of sutures:  1 Approximation:    Approximation:  Close Repair type:    Repair type:  Simple Post-procedure details:    Dressing:  Non-adherent dressing and splint for protection   Procedure completion:  Tolerated well, no immediate complications Comments:     Bleeding slowed after suture placement, still oozing.  Placed on sterile dressing with pressure of arm to help further slow.  She has a hematoma in the area causing pressure   My role in this patient's care was limited to laceration repair only, please see note by primary team for further details.    Ollen Gross 07/29/21 2115    Isla Pence, MD 07/30/21 1257

## 2021-07-29 NOTE — Progress Notes (Signed)
Patient discussed with EDP.  Imaging reviewed (2 views humerus, no dedicated elbow films).  There is a L comminuted intra-articular distal humerus fracture.  Type I open injury with puncture wound per EDP exam.  No exposed bone.  Recommend thorough irrigation in ED and simple suture closure of wound, followed by placement of long arm posterior slab splint by orthotech.  Admit to medicine.  Anticipate fixation Monday with ortho trauma.  NWB LUE.  Antibiotics pert type I open fracture protocol, update tetanus.  Full consult to follow.  Georgeanna Harrison M.D. Orthopaedic Surgery [facility]

## 2021-07-29 NOTE — ED Notes (Signed)
Patient transported to X-ray 

## 2021-07-29 NOTE — ED Triage Notes (Signed)
Pt BIB [facility] after walking out on a trail this morning. Pt was walking and tripped and fell. Upon EMS arrives pt's arm was deformed and stated pt has a left elbow compound fx. Pt is on a blood thinner and takes Brilinta. Pt denies any LOC or hitting her head. Pt is alert and oriented. Pt last ate at 8am this morning. Pt also received 29mg fentanyl.

## 2021-07-29 NOTE — ED Provider Notes (Signed)
[facility] EMERGENCY DEPARTMENT Provider Note   CSN: [id number] Arrival date & time: 07/29/21  1129     History  Chief Complaint  Patient presents with   Jacqueline Bonilla is a 66 y.o. female.  Pt is a 66 yo female with a pmhx significant for CVA, HLD, vascular dementia, depression, anxiety, htn, osteoporosis, and endometriosis.  Pt said she was walking on a trail, tripped over a rock, and fell on her left elbow.  She is on Brilinta, but denies hitting her head.  She had no loc.  She called EMS who found her left arm to be twisted and slightly dusky.  They repositioned her arm and the color returned to her arm.  They did given her 200 mcg fentanyl en route and that has helped the pain.  She denies any other pain.       Home Medications Prior to Admission medications   Medication Sig Start Date End Date Taking? Authorizing Provider  aspirin EC 81 MG tablet Take 81 mg by mouth daily. Swallow whole.    [provider]  atorvastatin (LIPITOR) 40 MG tablet Take 1 tablet (40 mg total) by mouth daily. 12/15/20   Angiulli, Lavon Paganini, PA-C  donepezil (ARICEPT) 10 MG tablet Take 1 tablet (10 mg total) by mouth at bedtime. 02/21/21   [name], MD  losartan (COZAAR) 25 MG tablet Take 0.5 tablets (12.5 mg total) by mouth daily. 04/24/21   Horald Pollen, MD  melatonin 5 MG TABS Take 1 tablet (5 mg total) by mouth at bedtime as needed. 12/15/20   Angiulli, Lavon Paganini, PA-C  Multiple Vitamin (MULTIVITAMIN WITH MINERALS) TABS tablet Take 1 tablet by mouth daily.    [provider]  Multiple Vitamins-Minerals (ZINC PO) Take 1 tablet by mouth daily.    [provider]  pantoprazole (PROTONIX) 40 MG tablet Take 1 tablet (40 mg total) by mouth daily. 12/15/20   Angiulli, Lavon Paganini, PA-C  thiamine 100 MG tablet Take 1 tablet (100 mg total) by mouth daily. 12/15/20   Angiulli, Lavon Paganini, PA-C  Turmeric (QC TUMERIC COMPLEX PO) Take 1 tablet  by mouth daily.    [provider]      Allergies    Avelox [moxifloxacin], Ciprofloxacin, Floraquin [iodoquinol], Iohexol, Levaquin [levofloxacin], Plavix [clopidogrel], Quinolones, and Shellfish allergy    Review of Systems   Review of Systems  Musculoskeletal:        Left elbow pain  All other systems reviewed and are negative.   Physical Exam Updated Vital Signs BP (!) 147/82   Pulse 80   Temp 97.6 F (36.4 C) (Oral)   Resp 12   LMP 01/02/2003   SpO2 100%  Physical Exam Vitals and nursing note reviewed.  Constitutional:      Appearance: Normal appearance.  HENT:     Head: Normocephalic and atraumatic.     Right Ear: External ear normal.     Left Ear: External ear normal.     Nose: Nose normal.     Mouth/Throat:     Mouth: Mucous membranes are moist.     Pharynx: Oropharynx is clear.  Eyes:     Extraocular Movements: Extraocular movements intact.     Conjunctiva/sclera: Conjunctivae normal.     Pupils: Pupils are equal, round, and reactive to light.  Cardiovascular:     Rate and Rhythm: Normal rate and regular rhythm.     Pulses: Normal pulses.  Heart sounds: Normal heart sounds.  Pulmonary:     Effort: Pulmonary effort is normal.     Breath sounds: Normal breath sounds.  Abdominal:     General: Abdomen is flat. Bowel sounds are normal.     Palpations: Abdomen is soft.  Musculoskeletal:     Left elbow: Swelling and deformity present. Decreased range of motion. Tenderness present.     Cervical back: Normal range of motion and neck supple.     Comments: Left elbow with significant swelling and tenderness.  Skin:    General: Skin is warm.     Capillary Refill: Capillary refill takes less than 2 seconds.     Comments: Puncture wound posterior elbow ? Open fx  Neurological:     General: No focal deficit present.     Mental Status: She is alert and oriented to person, place, and time.  Psychiatric:        Mood and Affect: Mood normal.         Behavior: Behavior normal.     ED Results / Procedures / Treatments   Labs (all labs ordered are listed, but only abnormal results are displayed) Labs Reviewed  BASIC METABOLIC PANEL - Abnormal; Notable for the following components:      Result Value   Glucose, Bld 112 (*)    BUN 25 (*)    Calcium 8.7 (*)    All other components within normal limits  CBC WITH DIFFERENTIAL/PLATELET - Abnormal; Notable for the following components:   WBC 11.6 (*)    RBC 3.83 (*)    Hemoglobin 11.6 (*)    HCT 35.9 (*)    Neutro Abs 10.0 (*)    All other components within normal limits  URINALYSIS, ROUTINE W REFLEX MICROSCOPIC    EKG None  Radiology DG Humerus Left  Result Date: 07/29/2021 CLINICAL DATA:  Fall with LEFT arm pain. EXAM: LEFT HUMERUS - 2 VIEW COMPARISON:  None Available. FINDINGS: A comminuted displaced intra-articular distal humeral fracture is identified. Limited evaluation secondary to patient inability to position. The proximal radius and ulna or difficult to fully evaluate on this study. IMPRESSION: 1. Comminuted displaced intra-articular distal humeral fracture. 2. Proximal radius and ulna not fully evaluated on this study. Dedicated elbow study is recommended for further evaluation. Electronically Signed   By: Margarette Canada M.D.   On: 07/29/2021 12:25    Procedures .Splint Application  Date/Time: 07/29/2021 2:59 PM  Performed by: Isla Pence, MD Authorized by: Isla Pence, MD   Consent:    Consent obtained:  Verbal   Consent given by:  Patient   Risks, benefits, and alternatives were discussed: yes     Alternatives discussed:  No treatment Universal protocol:    Patient identity confirmed:  Verbally with patient Pre-procedure details:    Distal neurologic exam:  Normal   Distal perfusion: distal pulses strong   Procedure details:    Location:  Elbow   Elbow location:  L elbow   Splint type:  Long arm   Supplies:  Elastic bandage, sling and cotton  padding Post-procedure details:    Distal perfusion: distal pulses strong     Procedure completion:  Tolerated well, no immediate complications Comments:     The splint was applied by the ortho tech.  I checked it after it was complete.     Medications Ordered in ED Medications  HYDROmorphone (DILAUDID) injection 0.5 mg (has no administration in time range)  ondansetron (ZOFRAN) injection 4 mg (has no  administration in time range)  docusate sodium (COLACE) capsule 100 mg (has no administration in time range)  ceFAZolin (ANCEF) IVPB 2g/100 mL premix (0 g Intravenous Stopped 07/29/21 1410)  Tdap (BOOSTRIX) injection 0.5 mL (0.5 mLs Intramuscular Given 07/29/21 1302)  morphine (PF) 4 MG/ML injection 4 mg (4 mg Intravenous Given 07/29/21 1300)  ondansetron (ZOFRAN) injection 4 mg (4 mg Intravenous Given 07/29/21 1258)  sodium chloride 0.9 % bolus 1,000 mL (0 mLs Intravenous Stopped 07/29/21 1410)  lidocaine-EPINEPHrine (XYLOCAINE W/EPI) 2 %-1:200000 (PF) injection 10 mL (10 mLs Infiltration Given 07/29/21 1258)  morphine (PF) 4 MG/ML injection 4 mg (4 mg Intravenous Given 07/29/21 1418)    ED Course/ Medical Decision Making/ A&P                           Medical Decision Making Amount and/or Complexity of Data Reviewed Labs: ordered. Radiology: ordered.  Risk Prescription drug management. Decision regarding hospitalization.   This patient presents to the ED for concern of fall, this involves an extensive number of treatment options, and is a complaint that carries with it a high risk of complications and morbidity.  The differential diagnosis includes multiple trauma   Co morbidities that complicate the patient evaluation  CVA, HLD, vascular dementia, depression, anxiety, htn, osteoporosis, and endometriosis   Additional history obtained:  Additional history obtained from epic chart review External records from outside source obtained and reviewed including EMS report   Lab  Tests:  I Ordered, and personally interpreted labs.  The pertinent results include:  cbc with hgb 11.6 (chronic); bmp nl   Imaging Studies ordered:  I ordered imaging studies including humerus, elbow, forearm I independently visualized and interpreted imaging which showed  Humerus IMPRESSION:  1. Comminuted displaced intra-articular distal humeral fracture.  2. Proximal radius and ulna not fully evaluated on this study.  Dedicated elbow study is recommended for further evaluation.   X-ray techs unable to do further x-rays due to bleeding.  CT elbow ordered to be done after puncture wound repaired and splint applied. CT elbow results pending upon admission I agree with the radiologist interpretation   Cardiac Monitoring:  The patient was maintained on a cardiac monitor.  I personally viewed and interpreted the cardiac monitored which showed an underlying rhythm of: nsr   Medicines ordered and prescription drug management:  I ordered medication including morphine  for pain  Reevaluation of the patient after these medicines showed that the patient improved I have reviewed the patients home medicines and have made adjustments as needed   Test Considered:  CT elbow   Critical Interventions:  Pain control   Consultations Obtained:  I requested consultation with the orthopedist (Dr. Mable Fill),  and discussed lab and imaging findings as well as pertinent plan - he will consult.  He requests a medicine admission Pt d/w Dr. Roosevelt Locks (triad) for admission.  Problem List / ED Course:  Left type 1 open humerus fx:  puncture wound irrigated thoroughly and sutured by PA Phylliss Bob.  Long arm splint applied by the ortho tech.  Pt given ancef and tetanus is updated.  Morphine given for pain control.   Reevaluation:  After the interventions noted above, I reevaluated the patient and found that they have :improved   Social Determinants of Health:  Lives at  home   Dispostion:  After consideration of the diagnostic results and the patients response to treatment, I feel that the patent would benefit from admission.  Final Clinical Impression(s) / ED Diagnoses Final diagnoses:  Fall, initial encounter  Open fracture of distal end of left humerus, unspecified fracture morphology, initial encounter  Long term current use of ticagrelor therapy    Rx / DC Orders ED Discharge Orders     None         Isla Pence, MD 07/29/21 1502

## 2021-07-30 DIAGNOSIS — Z8673 Personal history of transient ischemic attack (TIA), and cerebral infarction without residual deficits: Secondary | ICD-10-CM

## 2021-07-30 DIAGNOSIS — S42402A Unspecified fracture of lower end of left humerus, initial encounter for closed fracture: Secondary | ICD-10-CM | POA: Diagnosis not present

## 2021-07-30 DIAGNOSIS — D649 Anemia, unspecified: Secondary | ICD-10-CM

## 2021-07-30 DIAGNOSIS — F0150 Vascular dementia without behavioral disturbance: Secondary | ICD-10-CM

## 2021-07-30 DIAGNOSIS — I10 Essential (primary) hypertension: Secondary | ICD-10-CM

## 2021-07-30 LAB — CBC
HCT: 30.1 % — ABNORMAL LOW (ref 36.0–46.0)
Hemoglobin: 9.8 g/dL — ABNORMAL LOW (ref 12.0–15.0)
MCH: 30.2 pg (ref 26.0–34.0)
MCHC: 32.6 g/dL (ref 30.0–36.0)
MCV: 92.6 fL (ref 80.0–100.0)
Platelets: 178 10*3/uL (ref 150–400)
RBC: 3.25 MIL/uL — ABNORMAL LOW (ref 3.87–5.11)
RDW: 13.9 % (ref 11.5–15.5)
WBC: 6 10*3/uL (ref 4.0–10.5)
nRBC: 0 % (ref 0.0–0.2)

## 2021-07-30 LAB — BASIC METABOLIC PANEL
Anion gap: 6 (ref 5–15)
BUN: 19 mg/dL (ref 8–23)
CO2: 23 mmol/L (ref 22–32)
Calcium: 8.7 mg/dL — ABNORMAL LOW (ref 8.9–10.3)
Chloride: 105 mmol/L (ref 98–111)
Creatinine, Ser: 0.92 mg/dL (ref 0.44–1.00)
GFR, Estimated: >60 mL/min (ref >60)
Glucose, Bld: 138 mg/dL — ABNORMAL HIGH (ref 70–99)
Potassium: 4 mmol/L (ref 3.5–5.1)
Sodium: 134 mmol/L — ABNORMAL LOW (ref 135–145)

## 2021-07-30 LAB — SURGICAL PCR SCREEN
MRSA, PCR: NEGATIVE
Staphylococcus aureus: NEGATIVE

## 2021-07-30 LAB — HIV ANTIBODY (ROUTINE TESTING W REFLEX): HIV Screen 4th Generation wRfx: NONREACTIVE

## 2021-07-30 MED ORDER — DOCUSATE SODIUM 100 MG PO CAPS
100.0000 mg | ORAL_CAPSULE | Freq: Two times a day (BID) | ORAL | Status: DC
  Administered 2021-07-30 – 2021-07-31 (×3): 100 mg via ORAL
  Filled 2021-07-30 (×7): qty 1

## 2021-07-30 MED ORDER — ACETAMINOPHEN 500 MG PO TABS
1000.0000 mg | ORAL_TABLET | Freq: Three times a day (TID) | ORAL | Status: DC
  Administered 2021-07-30 – 2021-08-02 (×8): 1000 mg via ORAL
  Filled 2021-07-30 (×8): qty 2

## 2021-07-30 NOTE — Assessment & Plan Note (Signed)
-   Continue aspirin and atorvastatin - Resume Brilinta post-op now

## 2021-07-30 NOTE — Hospital Course (Signed)
Jacqueline Bonilla is a 66 y.o. 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   7/29: CT elbow showed comminuted left elbow fx; Ortho recommended ORIF Monday 7/31: To the OR today

## 2021-07-30 NOTE — Assessment & Plan Note (Signed)
Continue donepezil 

## 2021-07-30 NOTE — Assessment & Plan Note (Signed)
Mild anemia, relatively stable compared to her baseline

## 2021-07-30 NOTE — Progress Notes (Signed)
Orthopaedic Consult  Date/Time: 07/30/21 10:25 AM  Patient Name: Jacqueline Bonilla  Attending Physician: Edwin Dada, *    ASSESSMENT & PLAN  Orthopaedic Assessment: 66 y.o. female with comminuted type I protocol open left complete articular distal humerus fracture.  Reductions/Procedures/Splinting/Anesthesia Performed: Reductions: None Splinting/casting: None Procedure(s): None Anesthesia: N/A  Plan: Spoke with patient and her son about the nature of the injury, and reviewed the imaging with them.  Discussed possibility of surgical intervention in the form of open reduction internal fixation.  I explained that given the complexity of the fracture and likely poor bone quality, my recommendation would be to discuss and possibly defer treatment to orthopaedic traumatologist.  Small punctate wound was cleansed in the emergency room and closed with simple suture.  Posterior long-arm splint was applied by [employer] for immobilization.  Plan for n.p.o. at midnight for possible OR 07/31/2021.   Georgeanna Harrison M.D. Orthopaedic Surgery [facility]   Medical Decision Making  Amount/complexity of data: Is there a current pathologic fracture (e.g. neoplastic, osteoporotic insufficiency fracture)? Yes Independent interpretation of radiographic studies: Yes Review of radiology results (e.g. reports): Yes Tests ordered (e.g. additional radiographic studies, labs): Yes Lab results reviewed: Yes Reviewed old records: Yes History from another source (independent historian, e.g. family/friend/etc.): Yes Discussion of imaging, clinical data, and or management with independent medical provider: Yes Risk: Patient receiving IV controlled substances for pain: Yes Fracture requiring manipulation: No Urgent or emergent (non-elective) surgery likely this admission: Yes Presence of medical comorbidities and/or surgical risk factors (e.g. current smoker, CAD, diabetes,  COPD, CKD, etc.): Yes Closed fracture management WITHOUT manipulation: No Urgent minor procedure (e.g. joint aspiration, compartment pressure measurement, etc.): No Will likely need surgery as an outpatient: No     HPI Jacqueline Bonilla is a 66 y.o. female. Orthopaedic consultation has specifically been requested to address this patient's current musculoskeletal presentation.  She was walking in the park with her son and tripped over a rock and landed directly on her left elbow.  She presented to the emergency room, where she was noted to have a small punctate bleeding wound, with concern for type I poke hole open fracture.  There was no exposed bone.  She underwent washing and cleaning of the wound in the emergency room followed by closure with simple suture.  She received updated tetanus and antibiotics per protocol.  She denies any additional injuries.  Pain all localized to left elbow and arm, sharp and severe, worse with motion better with rest and immobilization.   PMH Past Medical History:  Diagnosis Date   Anemia    Anxiety    Depression    Dysmenorrhea    Endometriosis    Fibroid    Hypertension    Microhematuria    negative workup   Osteoporosis    Tachycardia    Thrombocytopenia ([place])      [place] Past Surgical History:  Procedure Laterality Date   BREAST BIOPSY     BUBBLE STUDY  12/08/2020   Procedure: BUBBLE STUDY;  Surgeon: Josue Hector, MD;  Location: [facility] ENDOSCOPY;  Service: Cardiovascular;;   CESAREAN SECTION     hysteroscopic resection     implantable loop recorder implant  10/21/2019   Medtronic Reveal Linq model LNQ 22 (SN [id number]) implantable loop recorder   IR CT HEAD LTD  12/01/2020   IR PERCUTANEOUS ART THROMBECTOMY/INFUSION INTRACRANIAL INC DIAG ANGIO  12/01/2020   IR US GUIDE VASC ACCESS RIGHT  12/01/2020  RADIOLOGY WITH ANESTHESIA N/A 12/01/2020   Procedure: IR WITH ANESTHESIA - CODE STROKE;  Surgeon: Radiologist, Medication, MD;  Location: [facility];   Service: Radiology;  Laterality: N/A;   TEE WITHOUT CARDIOVERSION N/A 12/08/2020   Procedure: TRANSESOPHAGEAL ECHOCARDIOGRAM (TEE);  Surgeon: Josue Hector, MD;  Location: [facility] ENDOSCOPY;  Service: Cardiovascular;  Laterality: N/A;   Home Medications Prior to Admission medications   Medication Sig Start Date End Date Taking? Authorizing Provider  Amino Acids Complex TABS Take 1 tablet by mouth daily.   Yes [provider]  aspirin EC 81 MG tablet Take 81 mg by mouth daily. Swallow whole.   Yes [provider]  BRILINTA 90 MG TABS tablet Take 90 mg by mouth 2 (two) times daily. 07/20/21  Yes [provider]  CINNAMON PO Take 1 capsule by mouth daily.   Yes [provider]  Coenzyme Q10 (CO Q 10 PO) Take 1 capsule by mouth daily.   Yes [provider]  losartan (COZAAR) 25 MG tablet Take 0.5 tablets (12.5 mg total) by mouth daily. 04/24/21  Yes Sagardia, Ines Bloomer, MD  MAGNESIUM PO Take 1 tablet by mouth at bedtime.   Yes [provider]  Multiple Vitamin (MULTIVITAMIN WITH MINERALS) TABS tablet Take 1 tablet by mouth daily.   Yes [provider]  Multiple Vitamins-Minerals (ZINC PO) Take 1 tablet by mouth daily.   Yes [provider]  atorvastatin (LIPITOR) 40 MG tablet Take 1 tablet (40 mg total) by mouth daily. Patient not taking: Reported on 07/29/2021 12/15/20   Angiulli, Lavon Paganini, PA-C  donepezil (ARICEPT) 10 MG tablet Take 1 tablet (10 mg total) by mouth at bedtime. Patient not taking: Reported on 07/29/2021 02/21/21   [name], MD  melatonin 5 MG TABS Take 1 tablet (5 mg total) by mouth at bedtime as needed. Patient not taking: Reported on 07/29/2021 12/15/20   Angiulli, Lavon Paganini, PA-C  pantoprazole (PROTONIX) 40 MG tablet Take 1 tablet (40 mg total) by mouth daily. Patient not taking: Reported on 07/29/2021 12/15/20   Angiulli, Lavon Paganini, PA-C  thiamine 100 MG tablet Take 1 tablet (100 mg total) by mouth  daily. Patient not taking: Reported on 07/29/2021 12/15/20   Cathlyn Parsons, PA-C     Allergies Allergies  Allergen Reactions   Avelox [Moxifloxacin]    Ciprofloxacin Hives   Floraquin [Iodoquinol]     No cipro or others   Iohexol      Code: HIVES, Desc: HIVES S/P IVP MANY YRS AGO    Levaquin [Levofloxacin]    Plavix [Clopidogrel] Diarrhea   Quinolones    Shellfish Allergy Itching    Itching under the chin. Described by patient but not seen by husband.     Family History Family History  Problem Relation Age of Onset   Cancer Father        non hodgkin lymphoma & skin   Heart attack Maternal Grandfather    Dementia Mother    Polymyositis Sister     Social History Social History   Socioeconomic History   Marital status: Married    Spouse name: [name]   Number of children: 1   Years of education: Not on file   Highest education level: Not on file  Occupational History   Occupation: accounting  Tobacco Use   Smoking status: Never   Smokeless tobacco: Never  Vaping Use   Vaping Use: Never used  Substance and Sexual Activity   Alcohol use: Never  Drug use: Never   Sexual activity: Not Currently    Partners: Male    Comment: husband vasectomy  Other Topics Concern   Not on file  Social History Narrative   Lives with husband   Grandchildren - 1   Works - Investment banker, 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%   Gun in home - yes - secured      Right handed   Caffeine: maybe tea every now and then   Social Determinants of Radio broadcast assistant Strain: Low Risk  (08/25/2020)   Overall Financial Resource Strain (CARDIA)    Difficulty of Paying Living Expenses: Not hard at all  Food Insecurity: No Food Insecurity (08/25/2020)   Hunger Vital Sign    Worried About Running Out of Food in the Last Year: Never true    [place] in the Last Year: Never true  Transportation Needs: No Transportation Needs (08/25/2020)   PRAPARE - Hydrologist  (Medical): No    Lack of Transportation (Non-Medical): No  Physical Activity: Sufficiently Active (08/25/2020)   Exercise Vital Sign    Days of Exercise per Week: 5 days    Minutes of Exercise per Session: 30 min  Stress: No Stress Concern Present (08/25/2020)   [place]    Feeling of Stress : Not at all  Social Connections: Not on file  Intimate Partner Violence: Not At Risk (08/25/2020)   Humiliation, Afraid, Rape, and Kick questionnaire    Fear of Current or Ex-Partner: No    Emotionally Abused: No    Physically Abused: No    Sexually Abused: No     Review of Systems MSK: As noted per HPI above GI: No current Nausea/vomiting ENT: Denies sore throat, epistaxis CV: Denies chest pain  Resp: No current shortness of breath  Other than mentioned above, there are no Constitutional, Neurological, Psychiatric, ENT, Ophthalmological, Cardiovascular, Respiratory, GI, GU, Musculoskeletal, Integumentary, Lymphatic, Endocrine or Allergic issues.     Imaging  Independent interpretation of orthopaedic-relevant films: 2 views of the left humerus demonstrate extensively comminuted complete articular fracture of distal humerus. CT including 3D recons of distal humerus better characterizes extensively comminuted type C intra-articular distal humerus fracture.  Radiographic results: CT ELBOW LEFT WO CONTRAST  Result Date: 07/29/2021 CLINICAL DATA:  Left elbow fracture EXAM: CT OF THE UPPER LEFT EXTREMITY WITHOUT CONTRAST TECHNIQUE: Multidetector CT imaging of the upper left extremity was performed according to the standard protocol. RADIATION DOSE REDUCTION: This exam was performed according to the departmental dose-optimization program which includes automated exposure control, adjustment of the mA and/or kV according to patient size and/or use of iterative reconstruction technique. COMPARISON:  X-ray 07/29/2021 FINDINGS:  Bones/Joint/Cartilage Acute heavily comminuted fracture of the distal humerus. Overall fracture alignment is significantly laterally displaced and mildly angulated. Relatively nondisplaced intra-articular component within the trochlea centrally. Supracondylar and transcondylar components with numerous displaced and angulated fracture fragments. There is a skin wound medially with air present within the soft tissues and within the joint compatible with open fracture and traumatic arthrotomy. Elbow joint alignment is maintained without dislocation. The proximal radius and ulna appear intact without evidence of fracture. Moderate-sized elbow joint hemarthrosis. Ligaments Suboptimally assessed by CT. Muscles and Tendons Suboptimally assessed but suspected posttraumatic intramuscular hematoma about the elbow with enlarged, heterogeneous appearance of the muscles. Soft tissues Diffuse soft tissue swelling about the elbow with medial skin wound and associated  soft tissue air. IMPRESSION: 1. Acute heavily comminuted displaced and angulated intra-articular fracture of the distal humerus, as described above. 2. Moderate-sized elbow joint hemarthrosis. 3. Diffuse soft tissue swelling about the elbow with medial skin wound and associated soft tissue air compatible with open fracture and traumatic arthrotomy. Electronically Signed   By: Davina Poke D.O.   On: 07/29/2021 16:13   DG Humerus Left  Result Date: 07/29/2021 CLINICAL DATA:  Fall with LEFT arm pain. EXAM: LEFT HUMERUS - 2 VIEW COMPARISON:  None Available. FINDINGS: A comminuted displaced intra-articular distal humeral fracture is identified. Limited evaluation secondary to patient inability to position. The proximal radius and ulna or difficult to fully evaluate on this study. IMPRESSION: 1. Comminuted displaced intra-articular distal humeral fracture. 2. Proximal radius and ulna not fully evaluated on this study. Dedicated elbow study is recommended for  further evaluation. Electronically Signed   By: Margarette Canada M.D.   On: 07/29/2021 12:25   CUP PACEART REMOTE DEVICE CHECK  Result Date: 07/23/2021 ILR summary report received. Battery status OK. Normal device function. No new symptom, tachy, brady, or pause episodes. No new AF episodes.  AF burden is 0% of the time.  Monthly summary reports and ROV/PRN Kathy Breach, RN, CCDS, [employer]  Labs  Recent Labs    07/29/21 1257 07/30/21 0137  WBC 11.6* 6.0  HGB 11.6* 9.8*  HCT 35.9* 30.1*  PLT 209 178   Recent Labs    07/29/21 1257 07/30/21 0137  NA 136 134*  K 4.3 4.0  CL 105 105  CO2 22 23  BUN 25* 19  CREATININE 0.97 0.92  GLUCOSE 112* 138*  CALCIUM 8.7* 8.7*   Lab Results  Component Value Date   INR 1.0 12/01/2020   INR 1.0 06/24/2020        Physical Examination  Patient is a 66 y.o. year old female who is alert, well appearing, and in no distress, mood is calm.  Orientation: oriented to person, place, time, and general circumstances  Vital Signs: BP 118/66 (BP Location: Right Arm)   Pulse 80   Temp 99.1 F (37.3 C) (Oral)   Resp 16   LMP 01/02/2003   SpO2 99%    Gait: Currently supine on hospital bed  Heart: Normal rate Lungs: Non-labored breathing Abdomen: Soft, Non-tender   Right Upper Extremity: Inspection: Atraumatic Palpation: Nontender ROM: Full, painless Strength: Normal Sensation: Intact to light touch distally Skin: Intact Peripheral Vascular: Well perfused Joint Stability: No instability Reflexes: No pathologic Lymph Nodes: None Palpable Coordination: Intact, normal   Left Upper Extremity: Inspection: Posterior long-arm splint Palpation: Severely tender to elbow at site of known fracture, some tenderness upper arm ROM: Elbow range of motion limited due to injury; wrist range of motion limited due to splint Strength: Able to demonstrate weak but intact FDP index and small finger, APB, dorsal interossei index finger only (small  finger blocked by splint) and EDC Sensation: Intact to light touch distally in the median, radial, and ulnar distributions Skin: Known punctate wound at elbow, closed with simple sutures in ED Peripheral Vascular: Normal distal pulse, warm and well-perfused distally Joint Stability: Unable to assess due to injury and immobilization Reflexes: No pathologic Lymph Nodes: None Palpable Coordination: Limited examination due to injury and immobilization    Right Lower Extremity: Inspection: Atraumatic Palpation: Nontender ROM: Full, painless Strength: Normal Sensation: Intact to light touch distally Skin: Intact Peripheral Vascular: Well perfused Joint Stability: No instability Reflexes: No pathologic Lymph Nodes:  None Palpable Coordination: Intact, normal   Left Lower Extremity: Inspection: Atraumatic Palpation: Nontender ROM: Full, painless Strength: Normal Sensation: Intact to light touch distally Skin: Intact Peripheral Vascular: Well perfused Joint Stability: No instability Reflexes: No pathologic Lymph Nodes: None Palpable Coordination: Intact, normal    Pelvis: Skin: Intact Palpation: Nontender Stability: No instability      The review of the patient's medications does not in any way constitute an endorsement, by this clinician,  of their use, dosage, indications, route, efficacy, interactions, or other clinical parameters.  This note was generated within the EP[employer] EMR using Dragon medical speech recognition software and may contain inherent errors or omissions not intended by the user. Grammatical and punctuation errors, random word insertions, deletions, pronoun errors and incomplete sentences are occasional consequences of this technology due to software limitations. Not all errors are caught or corrected.  Although every attempt is made to root out erroneus and incomplete transcription, the note may still not fully represent the intent or opinion of the author. If there  are questions or concerns about the content of this note or information contained within the body of this dictation they should be addressed directly with the author for clarification.

## 2021-07-30 NOTE — Progress Notes (Signed)
  Progress Note   Patient: Jacqueline Bonilla ZOX:[id number] DOB: 04-15-1955 DOA: 07/29/2021     1 DOS: the patient was seen and examined on 07/30/2021       Brief hospital course: [name] is a 66 y.o. 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   7/29: CT elbow showed comminuted left elbow fx; Ortho recommended ORIF Monday     Assessment and Plan: * Elbow fracture, left, closed, initial encounter - Consult orthopedics, appreciate expertise  History of stroke - Continue aspirin and atorvastatin  Normocytic anemia Mild anemia, relatively stable compared to her baseline  Vascular dementia without behavioral disturbance (HCC) - Continue donepezil  Essential hypertension Blood pressure normal - Continue losartan          Subjective: Patient is feeling well, she has some mild pain in her left arm, no confusion, no cardiac symptoms.     Physical Exam: Vitals:   07/29/21 1718 07/29/21 2021 07/30/21 0009 07/30/21 0458  BP: 139/78 124/68 127/69 118/66  Pulse: 89 90 81 80  Resp: '16 16 16 16  '$ Temp: 97.8 F (36.6 C) 97.9 F (36.6 C) 97.6 F (36.4 C) 99.1 F (37.3 C)  TempSrc: Oral Oral Oral Oral  SpO2: 100% 98% 98% 99%   Thin adult female, lying in bed, no acute distress, left arm in sling RRR, no murmurs, no peripheral edema Respiratory rate normal, lungs clear without rales or wheezes Abdomen soft no tenderness palpation or guarding, no ascites or distention Attention normal, affect pleasant, judgment and insight appear mildly impaired by dementia, but oriented to self, place, son, situation    Data Reviewed: Basic metabolic panel normal Hemogram shows mild anemia  Family Communication: Son at the bedside    Disposition: Status is: Inpatient         Author: Edwin Dada, MD 07/30/2021 9:55 AM  For  on call review [email or web address].

## 2021-07-30 NOTE — Assessment & Plan Note (Signed)
Blood pressure normal - Hold losartan preop, resume tomorrow

## 2021-07-30 NOTE — Assessment & Plan Note (Signed)
S/p open reduction internal fixation of left distal humerus fracture, ulnar osteotomy, and irrigation and debridement of left open distal humerus fracture 7/31 by Dr. Doreatha Martin - Consult orthopedics, appreciate expertise - PT eval - NWB LUE - No active extension L elbow - Home DAPT for DVT ppx - Dexa scan after discharge

## 2021-07-31 ENCOUNTER — Inpatient Hospital Stay (HOSPITAL_COMMUNITY): Payer: Medicare HMO

## 2021-07-31 ENCOUNTER — Other Ambulatory Visit: Payer: Self-pay

## 2021-07-31 ENCOUNTER — Inpatient Hospital Stay (HOSPITAL_COMMUNITY): Payer: Medicare HMO | Admitting: Anesthesiology

## 2021-07-31 ENCOUNTER — Encounter (HOSPITAL_COMMUNITY): Payer: Self-pay | Admitting: Internal Medicine

## 2021-07-31 ENCOUNTER — Encounter (HOSPITAL_COMMUNITY)
Admission: EM | Disposition: A | Discharge status: Home Health | Payer: Self-pay | Source: Home / Self Care | Attending: Family Medicine

## 2021-07-31 DIAGNOSIS — D649 Anemia, unspecified: Secondary | ICD-10-CM | POA: Diagnosis not present

## 2021-07-31 DIAGNOSIS — Z8673 Personal history of transient ischemic attack (TIA), and cerebral infarction without residual deficits: Secondary | ICD-10-CM

## 2021-07-31 DIAGNOSIS — S42402B Unspecified fracture of lower end of left humerus, initial encounter for open fracture: Secondary | ICD-10-CM

## 2021-07-31 DIAGNOSIS — I10 Essential (primary) hypertension: Secondary | ICD-10-CM | POA: Diagnosis not present

## 2021-07-31 DIAGNOSIS — F419 Anxiety disorder, unspecified: Secondary | ICD-10-CM

## 2021-07-31 DIAGNOSIS — S42402A Unspecified fracture of lower end of left humerus, initial encounter for closed fracture: Secondary | ICD-10-CM | POA: Diagnosis not present

## 2021-07-31 HISTORY — PX: ORIF HUMERUS FRACTURE: SHX2126

## 2021-07-31 LAB — CBC
HCT: 28.8 % — ABNORMAL LOW (ref 36.0–46.0)
Hemoglobin: 9.3 g/dL — ABNORMAL LOW (ref 12.0–15.0)
MCH: 30.1 pg (ref 26.0–34.0)
MCHC: 32.3 g/dL (ref 30.0–36.0)
MCV: 93.2 fL (ref 80.0–100.0)
Platelets: 183 10*3/uL (ref 150–400)
RBC: 3.09 MIL/uL — ABNORMAL LOW (ref 3.87–5.11)
RDW: 13.9 % (ref 11.5–15.5)
WBC: 6.2 10*3/uL (ref 4.0–10.5)
nRBC: 0 % (ref 0.0–0.2)

## 2021-07-31 LAB — BASIC METABOLIC PANEL
Anion gap: 7 (ref 5–15)
BUN: 14 mg/dL (ref 8–23)
CO2: 24 mmol/L (ref 22–32)
Calcium: 8.9 mg/dL (ref 8.9–10.3)
Chloride: 106 mmol/L (ref 98–111)
Creatinine, Ser: 0.84 mg/dL (ref 0.44–1.00)
GFR, Estimated: >60 mL/min (ref >60)
Glucose, Bld: 113 mg/dL — ABNORMAL HIGH (ref 70–99)
Potassium: 4 mmol/L (ref 3.5–5.1)
Sodium: 137 mmol/L (ref 135–145)

## 2021-07-31 LAB — PROTIME-INR
INR: 1.1 (ref 0.8–1.2)
Prothrombin Time: 14.5 seconds (ref 11.4–15.2)

## 2021-07-31 LAB — VITAMIN D 25 HYDROXY (VIT D DEFICIENCY, FRACTURES): Vit D, 25-Hydroxy: 61.93 ng/mL (ref 30–100)

## 2021-07-31 SURGERY — OPEN REDUCTION INTERNAL FIXATION (ORIF) DISTAL HUMERUS FRACTURE
Anesthesia: Monitor Anesthesia Care | Laterality: Left

## 2021-07-31 MED ORDER — FENTANYL CITRATE (PF) 250 MCG/5ML IJ SOLN
INTRAMUSCULAR | Status: DC | PRN
  Administered 2021-07-31: 50 ug via INTRAVENOUS
  Administered 2021-07-31: 25 ug via INTRAVENOUS

## 2021-07-31 MED ORDER — LOSARTAN POTASSIUM 25 MG PO TABS
12.5000 mg | ORAL_TABLET | Freq: Every day | ORAL | Status: DC
  Filled 2021-07-31: qty 1

## 2021-07-31 MED ORDER — LIDOCAINE 2% (20 MG/ML) 5 ML SYRINGE
INTRAMUSCULAR | Status: DC | PRN
  Administered 2021-07-31: 20 mg via INTRAVENOUS

## 2021-07-31 MED ORDER — VANCOMYCIN HCL 1000 MG IV SOLR
INTRAVENOUS | Status: AC
Start: 2021-07-31 — End: ?
  Filled 2021-07-31: qty 20

## 2021-07-31 MED ORDER — DEXAMETHASONE SODIUM PHOSPHATE 10 MG/ML IJ SOLN
INTRAMUSCULAR | Status: AC
  Filled 2021-07-31: qty 2

## 2021-07-31 MED ORDER — MIDAZOLAM HCL 2 MG/2ML IJ SOLN
INTRAMUSCULAR | Status: AC
  Filled 2021-07-31: qty 2

## 2021-07-31 MED ORDER — CEFAZOLIN SODIUM-DEXTROSE 2-4 GM/100ML-% IV SOLN
2.0000 g | INTRAVENOUS | Status: AC
  Administered 2021-07-31: 2 g via INTRAVENOUS
  Filled 2021-07-31: qty 100

## 2021-07-31 MED ORDER — TRANEXAMIC ACID-NACL 1000-0.7 MG/100ML-% IV SOLN
1000.0000 mg | INTRAVENOUS | Status: AC
  Administered 2021-07-31: 1000 mg via INTRAVENOUS
  Filled 2021-07-31 (×2): qty 100

## 2021-07-31 MED ORDER — LACTATED RINGERS IV SOLN
INTRAVENOUS | Status: DC
Start: 2021-07-31 — End: 2021-07-31

## 2021-07-31 MED ORDER — PHENYLEPHRINE HCL-NACL 20-0.9 MG/250ML-% IV SOLN
INTRAVENOUS | Status: DC | PRN
  Administered 2021-07-31: 25 ug/min via INTRAVENOUS

## 2021-07-31 MED ORDER — 0.9 % SODIUM CHLORIDE (POUR BTL) OPTIME
TOPICAL | Status: DC | PRN
  Administered 2021-07-31: 1000 mL

## 2021-07-31 MED ORDER — ACETAMINOPHEN 500 MG PO TABS: 1000.0000 mg | ORAL_TABLET | Freq: Once | ORAL | Status: DC

## 2021-07-31 MED ORDER — CHLORHEXIDINE GLUCONATE 0.12 % MT SOLN: 15.0000 mL | Freq: Once | OROMUCOSAL | Status: AC

## 2021-07-31 MED ORDER — FENTANYL CITRATE (PF) 250 MCG/5ML IJ SOLN
INTRAMUSCULAR | Status: AC
  Filled 2021-07-31: qty 5

## 2021-07-31 MED ORDER — EPHEDRINE 5 MG/ML INJ
INTRAVENOUS | Status: AC
  Filled 2021-07-31: qty 10

## 2021-07-31 MED ORDER — PHENYLEPHRINE 80 MCG/ML (10ML) SYRINGE FOR IV PUSH (FOR BLOOD PRESSURE SUPPORT)
PREFILLED_SYRINGE | INTRAVENOUS | Status: DC | PRN
  Administered 2021-07-31 (×2): 80 ug via INTRAVENOUS
  Administered 2021-07-31: 160 ug via INTRAVENOUS

## 2021-07-31 MED ORDER — DEXAMETHASONE SODIUM PHOSPHATE 10 MG/ML IJ SOLN
INTRAMUSCULAR | Status: DC | PRN
  Administered 2021-07-31: 8 mg via INTRAVENOUS

## 2021-07-31 MED ORDER — CHLORHEXIDINE GLUCONATE 4 % EX LIQD
60.0000 mL | Freq: Once | CUTANEOUS | Status: AC
  Administered 2021-07-31: 4 via TOPICAL
  Filled 2021-07-31: qty 60

## 2021-07-31 MED ORDER — PROPOFOL 10 MG/ML IV BOLUS
INTRAVENOUS | Status: DC | PRN
  Administered 2021-07-31: 100 mg via INTRAVENOUS
  Administered 2021-07-31: 40 mg via INTRAVENOUS

## 2021-07-31 MED ORDER — PHENYLEPHRINE 80 MCG/ML (10ML) SYRINGE FOR IV PUSH (FOR BLOOD PRESSURE SUPPORT)
PREFILLED_SYRINGE | INTRAVENOUS | Status: AC
  Filled 2021-07-31: qty 20

## 2021-07-31 MED ORDER — SUGAMMADEX SODIUM 200 MG/2ML IV SOLN
INTRAVENOUS | Status: DC | PRN
  Administered 2021-07-31: 200 mg via INTRAVENOUS

## 2021-07-31 MED ORDER — CHLORHEXIDINE GLUCONATE 0.12 % MT SOLN
OROMUCOSAL | Status: AC
  Administered 2021-07-31: 15 mL via OROMUCOSAL
  Filled 2021-07-31: qty 15

## 2021-07-31 MED ORDER — EPHEDRINE SULFATE-NACL 50-0.9 MG/10ML-% IV SOSY
PREFILLED_SYRINGE | INTRAVENOUS | Status: DC | PRN
  Administered 2021-07-31 (×2): 5 mg via INTRAVENOUS

## 2021-07-31 MED ORDER — FENTANYL CITRATE (PF) 100 MCG/2ML IJ SOLN: 25.0000 ug | INTRAMUSCULAR | Status: DC | PRN

## 2021-07-31 MED ORDER — ONDANSETRON HCL 4 MG/2ML IJ SOLN
INTRAMUSCULAR | Status: AC
  Filled 2021-07-31: qty 4

## 2021-07-31 MED ORDER — FENTANYL CITRATE (PF) 100 MCG/2ML IJ SOLN: 100.0000 ug | Freq: Once | INTRAMUSCULAR | Status: AC

## 2021-07-31 MED ORDER — CEFAZOLIN SODIUM-DEXTROSE 2-4 GM/100ML-% IV SOLN
2.0000 g | Freq: Three times a day (TID) | INTRAVENOUS | Status: AC
  Administered 2021-07-31 – 2021-08-01 (×3): 2 g via INTRAVENOUS
  Filled 2021-07-31 (×2): qty 100

## 2021-07-31 MED ORDER — FENTANYL CITRATE (PF) 100 MCG/2ML IJ SOLN
INTRAMUSCULAR | Status: AC
  Administered 2021-07-31: 100 ug via INTRAVENOUS
  Filled 2021-07-31: qty 2

## 2021-07-31 MED ORDER — ROCURONIUM BROMIDE 10 MG/ML (PF) SYRINGE
PREFILLED_SYRINGE | INTRAVENOUS | Status: AC
  Filled 2021-07-31: qty 20

## 2021-07-31 MED ORDER — ONDANSETRON HCL 4 MG/2ML IJ SOLN
INTRAMUSCULAR | Status: DC | PRN
  Administered 2021-07-31: 4 mg via INTRAVENOUS

## 2021-07-31 MED ORDER — ROCURONIUM BROMIDE 10 MG/ML (PF) SYRINGE
PREFILLED_SYRINGE | INTRAVENOUS | Status: DC | PRN
  Administered 2021-07-31: 50 mg via INTRAVENOUS

## 2021-07-31 MED ORDER — LIDOCAINE 2% (20 MG/ML) 5 ML SYRINGE
INTRAMUSCULAR | Status: AC
Start: 2021-07-31 — End: ?
  Filled 2021-07-31: qty 10

## 2021-07-31 MED ORDER — VANCOMYCIN HCL 1000 MG IV SOLR
INTRAVENOUS | Status: DC | PRN
  Administered 2021-07-31: 1000 mg via TOPICAL

## 2021-07-31 MED ORDER — ORAL CARE MOUTH RINSE: 15.0000 mL | Freq: Once | OROMUCOSAL | Status: AC

## 2021-07-31 SURGICAL SUPPLY — 93 items
BAG COUNTER SPONGE SURGICOUNT (BAG) ×2 IMPLANT
BIT DRILL 3.2 QUICK MINI 300 (DRILL) ×1 IMPLANT
BIT DRILL 5.0 QC 6.5 (BIT) ×1 IMPLANT
BIT DRILL QC 2.0 SHORT EVOS SM (DRILL) IMPLANT
BIT DRILL QC 2.5MM SHRT EVO SM (DRILL) IMPLANT
BLADE AVERAGE 25X9 (BLADE) IMPLANT
BNDG COHESIVE 4X5 TAN STRL (GAUZE/BANDAGES/DRESSINGS) ×2 IMPLANT
BNDG ELASTIC 3X5.8 VLCR STR LF (GAUZE/BANDAGES/DRESSINGS) ×1 IMPLANT
BNDG ELASTIC 4X5.8 VLCR STR LF (GAUZE/BANDAGES/DRESSINGS) ×1 IMPLANT
BNDG ELASTIC 6X5.8 VLCR STR LF (GAUZE/BANDAGES/DRESSINGS) ×1 IMPLANT
BNDG ESMARK 4X9 LF (GAUZE/BANDAGES/DRESSINGS) IMPLANT
BNDG GAUZE DERMACEA FLUFF (GAUZE/BANDAGES/DRESSINGS) ×2
BNDG GAUZE DERMACEA FLUFF 4 (GAUZE/BANDAGES/DRESSINGS) IMPLANT
BNDG GAUZE ELAST 4 BULKY (GAUZE/BANDAGES/DRESSINGS) ×4 IMPLANT
BRUSH SCRUB EZ PLAIN DRY (MISCELLANEOUS) ×4 IMPLANT
CLEANER TIP ELECTROSURG 2X2 (MISCELLANEOUS) ×2 IMPLANT
CORD BIPOLAR FORCEPS 12FT (ELECTRODE) ×2 IMPLANT
COVER SURGICAL LIGHT HANDLE (MISCELLANEOUS) ×2 IMPLANT
DRAIN PENROSE 1/4X12 LTX STRL (WOUND CARE) IMPLANT
DRAPE C-ARM 42X72 X-RAY (DRAPES) ×2 IMPLANT
DRAPE C-ARMOR (DRAPES) IMPLANT
DRAPE HALF SHEET 40X57 (DRAPES) ×2 IMPLANT
DRAPE INCISE IOBAN 66X45 STRL (DRAPES) IMPLANT
DRAPE ORTHO SPLIT 77X108 STRL (DRAPES) ×2
DRAPE SURG ORHT 6 SPLT 77X108 (DRAPES) ×1 IMPLANT
DRAPE U-SHAPE 47X51 STRL (DRAPES) ×2 IMPLANT
DRILL QC 2.0 SHORT EVOS SM (DRILL) ×2
DRILL QC 2.5MM SHORT EVOS SM (DRILL) ×2
DRSG ADAPTIC 3X8 NADH LF (GAUZE/BANDAGES/DRESSINGS) ×2 IMPLANT
DRSG MEPITEL 4X7.2 (GAUZE/BANDAGES/DRESSINGS) ×1 IMPLANT
DRSG PAD ABDOMINAL 8X10 ST (GAUZE/BANDAGES/DRESSINGS) ×3 IMPLANT
ELECT REM PT RETURN 9FT ADLT (ELECTROSURGICAL) ×2
ELECTRODE REM PT RTRN 9FT ADLT (ELECTROSURGICAL) ×1 IMPLANT
EVACUATOR 1/8 PVC DRAIN (DRAIN) IMPLANT
GAUZE SPONGE 4X4 12PLY STRL (GAUZE/BANDAGES/DRESSINGS) ×4 IMPLANT
GLOVE BIO SURGEON STRL SZ 6.5 (GLOVE) ×6 IMPLANT
GLOVE BIO SURGEON STRL SZ7.5 (GLOVE) ×6 IMPLANT
GLOVE BIOGEL PI IND STRL 6.5 (GLOVE) ×1 IMPLANT
GLOVE BIOGEL PI IND STRL 7.5 (GLOVE) ×1 IMPLANT
GLOVE BIOGEL PI INDICATOR 6.5 (GLOVE) ×1
GLOVE BIOGEL PI INDICATOR 7.5 (GLOVE) ×1
GOWN STRL REUS W/ TWL LRG LVL3 (GOWN DISPOSABLE) ×3 IMPLANT
GOWN STRL REUS W/ TWL XL LVL3 (GOWN DISPOSABLE) ×1 IMPLANT
GOWN STRL REUS W/TWL LRG LVL3 (GOWN DISPOSABLE) ×6
GOWN STRL REUS W/TWL XL LVL3 (GOWN DISPOSABLE) ×2
K-WIRE 1.6 (WIRE) ×6
K-WIRE FX150X1.6XTROC PNT (WIRE) ×3
KIT BASIN OR (CUSTOM PROCEDURE TRAY) ×2 IMPLANT
KIT TURNOVER KIT B (KITS) ×2 IMPLANT
KWIRE FX150X1.6XTROC PNT (WIRE) IMPLANT
LOOP VESSEL MAXI BLUE (MISCELLANEOUS) ×2 IMPLANT
MANIFOLD NEPTUNE II (INSTRUMENTS) ×2 IMPLANT
NDL HYPO 25X1 1.5 SAFETY (NEEDLE) IMPLANT
NEEDLE HYPO 25X1 1.5 SAFETY (NEEDLE) IMPLANT
NS IRRIG 1000ML POUR BTL (IV SOLUTION) ×2 IMPLANT
PACK ORTHO EXTREMITY (CUSTOM PROCEDURE TRAY) ×2 IMPLANT
PAD ARMBOARD 7.5X6 YLW CONV (MISCELLANEOUS) ×4 IMPLANT
PAD CAST 4YDX4 CTTN HI CHSV (CAST SUPPLIES) IMPLANT
PADDING CAST COTTON 4X4 STRL (CAST SUPPLIES) ×2
PADDING CAST COTTON 6X4 STRL (CAST SUPPLIES) ×1 IMPLANT
PLATE HUM EVOS 5H L 2.7X102 (Plate) ×1 IMPLANT
PLATE HUM EVOS 6H L 2.7X85 (Plate) ×1 IMPLANT
SCREW BONE LOCKING 2.7 X 48 (Screw) ×1 IMPLANT
SCREW CANN PT 6.5 X 120 46 (Screw) ×1 IMPLANT
SCREW CORT 2.7X15 T8 ST EVOS (Screw) ×1 IMPLANT
SCREW CORT 2.7X17 T8 ST EVOS (Screw) ×1 IMPLANT
SCREW CORT 2.7X38 T8 ST EVOS (Screw) ×1 IMPLANT
SCREW CORT 3.5X19 ST EVOS (Screw) ×2 IMPLANT
SCREW CORT 3.5X20 ST EVOS (Screw) ×2 IMPLANT
SCREW CORT 3.5X22 ST EVOS (Screw) ×1 IMPLANT
SCREW CORT VA EVOS 2.7X32 (Screw) ×1 IMPLANT
SCREW EVOS 2.7 X 50 LCK T8 S-T (Screw) ×1 IMPLANT
SCREW EVOS 2.7X18 LOCK T8 (Screw) ×1 IMPLANT
SCREW LOCK ST EVOS 2.7X22 (Screw) ×1 IMPLANT
SPONGE T-LAP 18X18 ~~LOC~~+RFID (SPONGE) IMPLANT
STAPLER VISISTAT 35W (STAPLE) IMPLANT
STOCKINETTE IMPERVIOUS 9X36 MD (GAUZE/BANDAGES/DRESSINGS) ×2 IMPLANT
SUCTION FRAZIER HANDLE 10FR (MISCELLANEOUS) ×2
SUCTION TUBE FRAZIER 10FR DISP (MISCELLANEOUS) ×1 IMPLANT
SUT ETHILON 3 0 PS 1 (SUTURE) ×4 IMPLANT
SUT PDS AB 3-0 SH 27 (SUTURE) ×1 IMPLANT
SUT VIC AB 0 CT1 27 (SUTURE) ×4
SUT VIC AB 0 CT1 27XBRD ANBCTR (SUTURE) ×2 IMPLANT
SUT VIC AB 2-0 CT1 27 (SUTURE) ×6
SUT VIC AB 2-0 CT1 TAPERPNT 27 (SUTURE) ×2 IMPLANT
SYR 5ML LL (SYRINGE) IMPLANT
SYR CONTROL 10ML LL (SYRINGE) ×2 IMPLANT
TOWEL GREEN STERILE (TOWEL DISPOSABLE) ×2 IMPLANT
TOWEL GREEN STERILE FF (TOWEL DISPOSABLE) ×2 IMPLANT
TRAY FOLEY MTR SLVR 16FR STAT (SET/KITS/TRAYS/PACK) IMPLANT
WASHER CANN 12.7 NS (Orthopedic Implant) ×1 IMPLANT
WATER STERILE IRR 1000ML POUR (IV SOLUTION) ×2 IMPLANT
YANKAUER SUCT BULB TIP NO VENT (SUCTIONS) IMPLANT

## 2021-07-31 NOTE — Op Note (Signed)
Orthopaedic Surgery Operative Note (CSN: [id number] ) Date of Surgery: 07/29/2021 - 07/31/2021  Admit Date: 07/29/2021   Diagnoses: Pre-Op Diagnoses: Left type I open distal humerus fracture  Post-Op Diagnosis: Same  Procedures: CPT 24546-Open reduction internal fixation of left distal humerus fracture CPT 25360-Ulnar osteotomy CPT 11012-Irrigation and debridement of left open distal humerus fracture  Surgeons : Primary: Shona Needles, MD  Assistant: Ainsley Spinner, PA-C  Location: OR 3   Anesthesia:General with regional block  Antibiotics: Ancef 2g preop with 1 gm vancomycin powder placed topically   Tourniquet time: None used    Estimated Blood Loss: 009 mL  Complications:(1) Difficult to intubate - expected  Comments: Filed from anesthesia note documentation.   Specimens:None  Implants: Implant Name Type Inv. Item Serial No. Manufacturer Lot No. LRB No. Used Action  6.5x120 cannulated screw 46 pt    [employer]  Left 1 Implanted  WASHER CANN 12.7 NS - [id number] Orthopedic Implant WASHER CANN 12.7 NS  [employer]  Left 1 Implanted  SCREW CORT 2.7X38 T8 ST EVOS - [id number] Screw SCREW CORT 2.7X38 T8 ST EVOS  [employer]  Left 1 Implanted  SCREW CORT 3.5X20 ST EVOS - [id number] Screw SCREW CORT 3.5X20 ST EVOS  [employer]  Left 2 Implanted  SCREW CORT 3.5X19 ST EVOS - [id number] Screw SCREW CORT 3.5X19 ST EVOS  [employer]  Left 1 Implanted  PLATE HUM EVOS 6H L 3.5T61 - [id number] Plate PLATE HUM EVOS 6H L 6.7Y19  [employer]  Left 1 Implanted  PLATE HUM EVOS 5H L 5.0D326 - [id number] Plate PLATE HUM EVOS 5H L 8.3J825  [employer]  Left 1 Implanted  SCREW CORT 3.5X22 ST EVOS - [id number] Screw SCREW CORT 3.5X22 ST EVOS  [employer]  Left 1 Implanted  SCREW EVOS 2.7 X 50 LCK T8 S-T - [id number] Screw SCREW EVOS 2.7 X 50 LCK T8 S-T  [employer]  [employer]TH[employer]  Left 1 Implanted  SCREW CORT VA EVOS 2.7X32 - [id number] Screw SCREW CORT VA EVOS 2.7X32  [employer]  Left 1 Implanted  SCREW BONE LOCKING 2.7 X 48 - [id number] Screw SCREW BONE LOCKING 2.7 X 48  [employer]  Left 1 Implanted  SCREW CORT 2.7X15 T8 ST EVOS - [id number] Screw SCREW CORT 2.7X15 T8 ST EVOS  [employer]  Left 1 Implanted  SCREW CORT 2.7X17 T8 ST EVOS - [id number] Screw SCREW CORT 2.7X17 T8 ST EVOS  [employer]  Left 1 Implanted  SCREW BONE LOCKING 2.7 X 48 - [id number] Screw SCREW BONE LOCKING 2.7 X 48  [employer]  Left 1 Implanted  SCREW CORT 2.7X15 T8 ST EVOS - [id number] Screw SCREW CORT 2.7X15 T8 ST EVOS  [employer]  Left 1 Implanted  SCREW CORT 2.7X17 T8 ST EVOS - [id number] Screw SCREW CORT 2.7X17 T8 ST EVOS  [employer]  Left 1 Implanted  SCREW EVOS 2.7X18 LOCK T8 - [id number] Screw SCREW EVOS 2.7X18 LOCK T8  [employer]  Left 1 Implanted     Indications for Surgery: 66 year old female who sustained a ground-level fall and left intra-articular distal humerus fracture that is type I open.  She received antibiotics and she was indicated for open reduction internal fixation of her left humerus.  Risk and benefits were discussed with the patient  and her husband.  Risks included but not limited to bleeding, infection, malunion, nonunion, hardware failure, hardware irritation, nerve and blood vessel injury, DVT, posttraumatic arthritis, elbow stiffness, even the possibility anesthetic complications.  She agreed to proceed with surgery and consent was obtained.  Operative Findings: 1.  Open reduction internal fixation of left intra-articular distal humerus fracture using [employer] EVOS posterior lateral and direct medial distal humeral locking plates 2.  Olecranon osteotomy to left ulna with fixation using a [employer]hew 6.5 mm  partially-threaded cannulated screw 3.  Irrigation and debridement of left type I open humerus fracture.  Procedure: The patient was identified in the preoperative holding area. Consent was confirmed with the patient and their family and all questions were answered. The operative extremity was marked after confirmation with the patient. she was then brought back to the operating room by our anesthesia colleagues.  She was carefully transferred over to radiolucent flat top table.  She was placed under general anesthetic.  She was then placed in the lateral decubitus position with her left side up.  An axillary roll was placed to keep pressure off of her neurovascular structures.  The left upper extremity was then prepped and draped in usual sterile fashion.  A timeout was performed to verify the patient, the procedure, and the extremity.  Preoperative antibiotics were dosed.  Fluoroscopic imaging showed the unstable nature of her injury.  There is a small punctate wound over the medial side of her elbow that had a small suture in place.  I made a direct posterior approach to the elbow.  I carried it down through skin and subcutaneous tissue.  I incised through the triceps fascia.  I then developed the lateral and medial intermuscular septum intervals.  Started out with the lateral side and dissected through the anconeus to visualize the articular surface.  I then carefully dissected underneath the triceps to the humeral shaft proximally.  I then carefully dissected the ulnar nerve out from the medial intermuscular septum.  I dissected this and freed the nerve all the way down to the cubital tunnel.  I protected this throughout the case.  The intra-articular fracture was significantly displaced and I felt that an osteotomy was required to fix the distal humerus.  I placed a 6.5 mm cannulated screw under fluoroscopic guidance to cut path for later repair of the osteotomy.  I then removed the screw and I  performed a chevron osteotomy using a oscillating saw and finishing it with an osteotome.  The triceps was reflected proximally and then I would be able to visualize the intra-articular split.  I cleaned out the hematoma and then proceeded to anatomically reduce the intra-articular split.  I used a reduction tenaculum and held provisionally with a 1.6 mm K wire.  I then drilled and placed a 2.7 millimeter screw to provisionally hold the intra-articular reduction.  I remove my clamp and then proceeded to focus on the metaphyseal reduction.  There was a large metaphyseal spike on the lateral condyle that had a good cortical read to the humeral shaft.  I was able to reduce this anatomically held it provisionally with a reduction tenaculum.  I then was able to place a posterior lateral plate and held it provisionally with a K wire and confirmed reduction and placement using fluoroscopy.  I then placed nonlocking screws in the metaphysis in the humeral shaft to fix the plate and the fracture.  I then placed another nonlocking screw into the  humeral shaft.  I then turned my attention to the medial side.  I was able to release a portion of the soft tissue off of the medial epicondyle and reduce this back to the metaphysis.  I held it provisionally with K wires and remove the clamp.  I then placed a direct medial plate and held provisionally with a K wire.  I then placed nonlocking screws into the humeral shaft and then proceeded to place a locking screws across the intra-articular split.  I finished up by placing locking screws into the capitellum to complete the construct.  Fluoroscopic imaging was obtained showing anatomic reduction of the distal humerus.  I then proceeded to reduce the osteotomy back to the ulna and placed the 6.5 mm cannulated screw with a washer and obtained excellent fixation.  Final fluoroscopic imaging was obtained and the incision was copiously irrigated.  A gram of vancomycin powder was  placed into the incision.  A layered closure of 0 Vicryl, 2-0 Vicryl and 3-0 nylon was used to close the skin.  Sterile dressing of Mepitel, 4 x 4's, sterile cast padding and Ace wrap was placed.  She was placed back in her sling.  She was awoken from anesthesia and taken to the PACU in stable condition.   Debridement type: Excisional Debridement  Side: left  Body Location: Elbow  Tools used for debridement: scissors, curette, and rongeur  Pre-debridement Wound size (cm):   Length: 1        Width: 0.5     Depth: 0.5   Post-debridement Wound size (cm):   N/A-closed  Debridement depth beyond dead/damaged tissue down to healthy viable tissue: yes  Tissue layer involved: skin, subcutaneous tissue, muscle / fascia, bone  Nature of tissue removed: Devitalized Tissue  Irrigation volume: 2L     Irrigation fluid type: Normal Saline   Post Op Plan/Instructions: The patient will be nonweightbearing to the left upper extremity.  She will have unrestricted range of motion of the left arm.  She will receive postoperative Ancef.  She will be placed back on her antiplatelet therapy for DVT prophylaxis.  We will mobilize her with physical and Occupational Therapy.  I was present and performed the entire surgery.  Ainsley Spinner, PA-C did assist me throughout the case. An assistant was necessary given the difficulty in approach, maintenance of reduction and ability to instrument the fracture.   Katha Hamming, MD [facility]

## 2021-07-31 NOTE — Progress Notes (Signed)
Contacted dr Lanetta Inch about medtronic pacemaker. Medtronic reps called and unable to be present before scheduled procedure due to being at [facility] right now. No new ordes from Dr. Lanetta Inch at this time.

## 2021-07-31 NOTE — Interval H&P Note (Signed)
History and Physical Interval Note:  07/31/2021 2:14 PM  Jacqueline Bonilla  has presented today for surgery, with the diagnosis of Left distal humerus fracture.  The various methods of treatment have been discussed with the patient and family. After consideration of risks, benefits and other options for treatment, the patient has consented to  Procedure(s): OPEN REDUCTION INTERNAL FIXATION (ORIF) DISTAL HUMERUS FRACTURE (Left) as a surgical intervention.  The patient's history has been reviewed, patient examined, no change in status, stable for surgery.  I have reviewed the patient's chart and labs.  Questions were answered to the patient's satisfaction.     Lennette Bihari P Emelin Dascenzo

## 2021-07-31 NOTE — Consult Note (Signed)
Reason for Consult:Left elbow fx Referring Physician: Georgeanna Harrison Time called: 0730 Time at bedside: 0920   Jacqueline Bonilla is an 66 y.o. female.  HPI: Jacqueline Bonilla was walking in a park and tripped over some rocks. She had immediate left elbow pain and noticed a wound. She was brought to the ED where x-rays showed a distal humerus fx that was open and orthopedic surgery was consulted. Due to the complexity of the injury orthopedic trauma consultation was requested. She is RHD and lives with her husband.  Past Medical History:  Diagnosis Date   Anemia    Anxiety    Depression    Dysmenorrhea    Endometriosis    Fibroid    Hypertension    Microhematuria    negative workup   Osteoporosis    Tachycardia    Thrombocytopenia ([place])     Past Surgical History:  Procedure Laterality Date   BREAST BIOPSY     BUBBLE STUDY  12/08/2020   Procedure: BUBBLE STUDY;  Surgeon: Josue Hector, MD;  Location: [facility] ENDOSCOPY;  Service: Cardiovascular;;   CESAREAN SECTION     hysteroscopic resection     implantable loop recorder implant  10/21/2019   Medtronic Reveal Linq model LNQ 22 ([place] [id number]) implantable loop recorder   IR CT HEAD LTD  12/01/2020   IR PERCUTANEOUS ART THROMBECTOMY/INFUSION INTRACRANIAL INC DIAG ANGIO  12/01/2020   IR US GUIDE VASC ACCESS RIGHT  12/01/2020   RADIOLOGY WITH ANESTHESIA N/A 12/01/2020   Procedure: IR WITH ANESTHESIA - CODE STROKE;  Surgeon: Radiologist, Medication, MD;  Location: [facility];  Service: Radiology;  Laterality: N/A;   TEE WITHOUT CARDIOVERSION N/A 12/08/2020   Procedure: TRANSESOPHAGEAL ECHOCARDIOGRAM (TEE);  Surgeon: Josue Hector, MD;  Location: [facility] ENDOSCOPY;  Service: Cardiovascular;  Laterality: N/A;    Family History  Problem Relation Age of Onset   Cancer Father        non hodgkin lymphoma & skin   Heart attack Maternal Grandfather    Dementia Mother    Polymyositis Sister     Social History:  reports that she has never smoked. She has  never used smokeless tobacco. She reports that she does not drink alcohol and does not use drugs.  Allergies:  Allergies  Allergen Reactions   Avelox [Moxifloxacin]    Ciprofloxacin Hives   Floraquin [Iodoquinol]     No cipro or others   Iohexol      Code: HIVES, Desc: HIVES S/P IVP MANY YRS AGO    Levaquin [Levofloxacin]    Plavix [Clopidogrel] Diarrhea   Quinolones    Shellfish Allergy Itching    Itching under the chin. Described by patient but not seen by husband.    Medications: I have reviewed the patient's current medications.  Results for orders placed or performed during the hospital encounter of 07/29/21 (from the past 48 hour(s))  Basic metabolic panel     Status: Abnormal   Collection Time: 07/29/21 12:57 PM  Result Value Ref Range   Sodium 136 135 - 145 mmol/L   Potassium 4.3 3.5 - 5.1 mmol/L   Chloride 105 98 - 111 mmol/L   CO2 22 22 - 32 mmol/L   Glucose, Bld 112 (H) 70 - 99 mg/dL    Comment: Glucose reference range applies only to samples taken after fasting for at least 8 hours.   BUN 25 (H) 8 - 23 mg/dL   Creatinine, Ser 0.97 0.44 - 1.00 mg/dL   Calcium 8.7 (L)  8.9 - 10.3 mg/dL   GFR, Estimated >60 >60 mL/min    Comment: (NOTE) Calculated using the CKD-EPI Creatinine Equation (2021)    Anion gap 9 5 - 15    Comment: Performed at [facility], [place] [address], [place], [place] [address]  CBC with Differential     Status: Abnormal   Collection Time: 07/29/21 12:57 PM  Result Value Ref Range   WBC 11.6 (H) 4.0 - 10.5 K/uL   RBC 3.83 (L) 3.87 - 5.11 MIL/uL   Hemoglobin 11.6 (L) 12.0 - 15.0 g/dL   HCT 35.9 (L) 36.0 - 46.0 %   MCV 93.7 80.0 - 100.0 fL   MCH 30.3 26.0 - 34.0 pg   MCHC 32.3 30.0 - 36.0 g/dL   RDW 14.1 11.5 - 15.5 %   Platelets 209 150 - 400 K/uL   nRBC 0.0 0.0 - 0.2 %   Neutrophils Relative % 86 %   Neutro Abs 10.0 (H) 1.7 - 7.7 K/uL   Lymphocytes Relative 7 %   Lymphs Abs 0.8 0.7 - 4.0 K/uL   Monocytes Relative 5 %   Monocytes  Absolute 0.6 0.1 - 1.0 K/uL   Eosinophils Relative 0 %   Eosinophils Absolute 0.0 0.0 - 0.5 K/uL   Basophils Relative 1 %   Basophils Absolute 0.1 0.0 - 0.1 K/uL   Immature Granulocytes 1 %   Abs Immature Granulocytes 0.06 0.00 - 0.07 K/uL    Comment: Performed at [facility] [address] [place], [name] [address]  HIV Antibody (routine testing w rflx)     Status: None   Collection Time: 07/30/21  1:37 AM  Result Value Ref Range   HIV Screen 4th Generation wRfx Non Reactive Non Reactive    Comment: Performed at [facility], [place] [address], [place], [name] [address]  CBC     Status: Abnormal   Collection Time: 07/30/21  1:37 AM  Result Value Ref Range   WBC 6.0 4.0 - 10.5 K/uL   RBC 3.25 (L) 3.87 - 5.11 MIL/uL   Hemoglobin 9.8 (L) 12.0 - 15.0 g/dL   HCT 30.1 (L) 36.0 - 46.0 %   MCV 92.6 80.0 - 100.0 fL   MCH 30.2 26.0 - 34.0 pg   MCHC 32.6 30.0 - 36.0 g/dL   RDW 13.9 11.5 - 15.5 %   Platelets 178 150 - 400 K/uL   nRBC 0.0 0.0 - 0.2 %    Comment: Performed at [facility], [place] [address] [place], [place] [address]  Basic metabolic panel     Status: Abnormal   Collection Time: 07/30/21  1:37 AM  Result Value Ref Range   Sodium 134 (L) 135 - 145 mmol/L   Potassium 4.0 3.5 - 5.1 mmol/L   Chloride 105 98 - 111 mmol/L   CO2 23 22 - 32 mmol/L   Glucose, Bld 138 (H) 70 - 99 mg/dL    Comment: Glucose reference range applies only to samples taken after fasting for at least 8 hours.   BUN 19 8 - 23 mg/dL   Creatinine, Ser 0.92 0.44 - 1.00 mg/dL   Calcium 8.7 (L) 8.9 - 10.3 mg/dL   GFR, Estimated >60 >60 mL/min    Comment: (NOTE) Calculated using the CKD-EPI Creatinine Equation (2021)    Anion gap 6 5 - 15    Comment: Performed at [facility] [address], [place], [place] [address]  Surgical pcr  screen     Status: None   Collection Time: 07/30/21  6:10 PM   Specimen: Nasal Mucosa; Nasal Swab  Result Value Ref Range   MRSA, PCR NEGATIVE  NEGATIVE   Staphylococcus aureus NEGATIVE NEGATIVE    Comment: (NOTE) The Xpert SA Assay (FDA approved for NASAL specimens in patients 27 years of age and older), is one component of a comprehensive surveillance program. It is not intended to diagnose infection nor to guide or monitor treatment. Performed at [facility], [place] [address], [place], [place] [address]   CBC     Status: Abnormal   Collection Time: 07/31/21  1:02 AM  Result Value Ref Range   WBC 6.2 4.0 - 10.5 K/uL   RBC 3.09 (L) 3.87 - 5.11 MIL/uL   Hemoglobin 9.3 (L) 12.0 - 15.0 g/dL   HCT 28.8 (L) 36.0 - 46.0 %   MCV 93.2 80.0 - 100.0 fL   MCH 30.1 26.0 - 34.0 pg   MCHC 32.3 30.0 - 36.0 g/dL   RDW 13.9 11.5 - 15.5 %   Platelets 183 150 - 400 K/uL   nRBC 0.0 0.0 - 0.2 %    Comment: Performed at [facility], [place] [address], [place], [place] [address]  Basic metabolic panel     Status: Abnormal   Collection Time: 07/31/21  1:02 AM  Result Value Ref Range   Sodium 137 135 - 145 mmol/L   Potassium 4.0 3.5 - 5.1 mmol/L   Chloride 106 98 - 111 mmol/L   CO2 24 22 - 32 mmol/L   Glucose, Bld 113 (H) 70 - 99 mg/dL    Comment: Glucose reference range applies only to samples taken after fasting for at least 8 hours.   BUN 14 8 - 23 mg/dL   Creatinine, Ser 0.84 0.44 - 1.00 mg/dL   Calcium 8.9 8.9 - 10.3 mg/dL   GFR, Estimated >60 >60 mL/min    Comment: (NOTE) Calculated using the CKD-EPI Creatinine Equation (2021)    Anion gap 7 5 - 15    Comment: Performed at [facility] [address], [place], [place] [address]  Protime-INR     Status: None   Collection Time: 07/31/21  1:02 AM  Result Value Ref Range   Prothrombin Time 14.5 11.4 - 15.2 seconds   INR 1.1 0.8 - 1.2    Comment: (NOTE) INR goal varies based on device and disease states. Performed at [facility], [place] [address], [place], [place] [address]     CT ELBOW LEFT WO CONTRAST  Result Date: 07/29/2021 CLINICAL DATA:  Left elbow  fracture EXAM: CT OF THE UPPER LEFT EXTREMITY WITHOUT CONTRAST TECHNIQUE: Multidetector CT imaging of the upper left extremity was performed according to the standard protocol. RADIATION DOSE REDUCTION: This exam was performed according to the departmental dose-optimization program which includes automated exposure control, adjustment of the mA and/or kV according to patient size and/or use of iterative reconstruction technique. COMPARISON:  X-ray 07/29/2021 FINDINGS: Bones/Joint/Cartilage Acute heavily comminuted fracture of the distal humerus. Overall fracture alignment is significantly laterally displaced and mildly angulated. Relatively nondisplaced intra-articular component within the trochlea centrally. Supracondylar and transcondylar components with numerous displaced and angulated fracture fragments. There is a skin wound medially with air present within the soft tissues and within the joint compatible with open fracture and traumatic arthrotomy. Elbow joint alignment is maintained without dislocation. The proximal radius and ulna appear intact without evidence of fracture.  Moderate-sized elbow joint hemarthrosis. Ligaments Suboptimally assessed by CT. Muscles and Tendons Suboptimally assessed but suspected posttraumatic intramuscular hematoma about the elbow with enlarged, heterogeneous appearance of the muscles. Soft tissues Diffuse soft tissue swelling about the elbow with medial skin wound and associated soft tissue air. IMPRESSION: 1. Acute heavily comminuted displaced and angulated intra-articular fracture of the distal humerus, as described above. 2. Moderate-sized elbow joint hemarthrosis. 3. Diffuse soft tissue swelling about the elbow with medial skin wound and associated soft tissue air compatible with open fracture and traumatic arthrotomy. Electronically Signed   By: Davina Poke D.O.   On: 07/29/2021 16:13   DG Humerus Left  Result Date: 07/29/2021 CLINICAL DATA:  Fall with LEFT arm  pain. EXAM: LEFT HUMERUS - 2 VIEW COMPARISON:  None Available. FINDINGS: A comminuted displaced intra-articular distal humeral fracture is identified. Limited evaluation secondary to patient inability to position. The proximal radius and ulna or difficult to fully evaluate on this study. IMPRESSION: 1. Comminuted displaced intra-articular distal humeral fracture. 2. Proximal radius and ulna not fully evaluated on this study. Dedicated elbow study is recommended for further evaluation. Electronically Signed   By: Margarette Canada M.D.   On: 07/29/2021 12:25    Review of Systems  HENT:  Negative for ear discharge, ear pain, hearing loss and tinnitus.   Eyes:  Negative for photophobia and pain.  Respiratory:  Negative for cough and shortness of breath.   Cardiovascular:  Negative for chest pain.  Gastrointestinal:  Negative for abdominal pain, nausea and vomiting.  Genitourinary:  Negative for dysuria, flank pain, frequency and urgency.  Musculoskeletal:  Positive for arthralgias (Left elbow). Negative for back pain, myalgias and neck pain.  Neurological:  Negative for dizziness and headaches.  Hematological:  Does not bruise/bleed easily.  Psychiatric/Behavioral:  The patient is not nervous/anxious.    Blood pressure 120/70, pulse 82, temperature 99.2 F (37.3 C), temperature source Oral, resp. rate 16, last menstrual period 01/02/2003, SpO2 100 %. Physical Exam Constitutional:      General: She is not in acute distress.    Appearance: She is well-developed. She is not diaphoretic.  HENT:     Head: Normocephalic and atraumatic.  Eyes:     General: No scleral icterus.       Right eye: No discharge.        Left eye: No discharge.     Conjunctiva/sclera: Conjunctivae normal.  Cardiovascular:     Rate and Rhythm: Normal rate and regular rhythm.  Pulmonary:     Effort: Pulmonary effort is normal. No respiratory distress.  Musculoskeletal:     Cervical back: Normal range of motion.     Comments:  Left shoulder, elbow, wrist, digits- no skin wounds, elbow splint in place, no instability, no blocks to motion  Sens  Ax/R/M/U intact  Mot   Ax/ R/ PIN/ M/ AIN/ U grossly intact  Skin:    General: Skin is warm and dry.  Neurological:     Mental Status: She is alert.  Psychiatric:        Mood and Affect: Mood normal.        Behavior: Behavior normal.     Assessment/Plan: Left open distal humerus fx -- Plan I&D, ORIF today with Dr. Doreatha Martin. Please keep NPO.    Lisette Abu, PA-C Orthopedic Surgery [phone] 07/31/2021, 9:28 AM

## 2021-07-31 NOTE — Progress Notes (Signed)
  Progress Note   Patient: Jacqueline Bonilla QBH:[id number] DOB: Mar 07, 1955 DOA: 07/29/2021     2 DOS: the patient was seen and examined on 07/31/2021       Brief hospital course: Jacqueline Bonilla is a 66 y.o. 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   7/29: CT elbow showed comminuted left elbow fx; Ortho recommended ORIF Monday 7/31: To the OR today     Assessment and Plan: * Elbow fracture, left, closed, initial encounter - Consult orthopedics, appreciate expertise - Plan is for ORIF today    History of stroke - Continue aspirin and atorvastatin  Normocytic anemia Mild anemia, relatively stable compared to her baseline  Vascular dementia without behavioral disturbance (HCC) - Continue donepezil  Essential hypertension Blood pressure normal - Continue losartan          Subjective: No complaints, a little bit sleepy this morning, but oriented to self, situation.  No headache, chest pain, palpitations, dyspnea, orthopnea, swelling.     Physical Exam: Vitals:   07/30/21 1553 07/30/21 2135 07/31/21 0606 07/31/21 0727  BP: 126/67 120/71 119/74 120/70  Pulse: 78 79 83 82  Resp: '16 17 17 16  '$ Temp: 98.7 F (37.1 C)   99.2 F (37.3 C)  TempSrc: Oral   Oral  SpO2: 98% 95% 100% 100%   Thin adult female, sleeping, opens eyes and responds to questions RRR, no murmurs, no peripheral edema Respiratory rate normal, lungs clear without rales or wheezes Abdomen soft no tenderness palpation or guarding Left arm is in a splint and immobilized Attention seems diminished, she is sleepy, affect blunted, face symmetric, speech fluent     Data Reviewed: INR normal Sodium normalized Creatinine potassium normal Hemogram shows mild anemia only      Disposition: Status is: Inpatient The patient was admitted for fracture of the left  elbow.  She is medically stable and cleared for surgery.  Postoperatively, discharge planning per orthopedics, hopefully home today or tomorrow        Author: Edwin Dada, MD 07/31/2021 8:41 AM  For on call review [email or web address].

## 2021-07-31 NOTE — H&P (View-Only) (Signed)
Reason for Consult:Left elbow fx Referring Physician: Georgeanna Harrison Time called: 0730 Time at bedside: 0920   Jacqueline Bonilla is an 66 y.o. female.  HPI: Jacqueline Bonilla was walking in a park and tripped over some rocks. She had immediate left elbow pain and noticed a wound. She was brought to the ED where x-rays showed a distal humerus fx that was open and orthopedic surgery was consulted. Due to the complexity of the injury orthopedic trauma consultation was requested. She is RHD and lives with her husband.  Past Medical History:  Diagnosis Date   Anemia    Anxiety    Depression    Dysmenorrhea    Endometriosis    Fibroid    Hypertension    Microhematuria    negative workup   Osteoporosis    Tachycardia    Thrombocytopenia ([place])     Past Surgical History:  Procedure Laterality Date   BREAST BIOPSY     BUBBLE STUDY  12/08/2020   Procedure: BUBBLE STUDY;  Surgeon: Josue Hector, MD;  Location: [facility] ENDOSCOPY;  Service: Cardiovascular;;   CESAREAN SECTION     hysteroscopic resection     implantable loop recorder implant  10/21/2019   Medtronic Reveal Linq model LNQ 22 ([place] [id number]) implantable loop recorder   IR CT HEAD LTD  12/01/2020   IR PERCUTANEOUS ART THROMBECTOMY/INFUSION INTRACRANIAL INC DIAG ANGIO  12/01/2020   IR US GUIDE VASC ACCESS RIGHT  12/01/2020   RADIOLOGY WITH ANESTHESIA N/A 12/01/2020   Procedure: IR WITH ANESTHESIA - CODE STROKE;  Surgeon: Radiologist, Medication, MD;  Location: [place];  Service: Radiology;  Laterality: N/A;   TEE WITHOUT CARDIOVERSION N/A 12/08/2020   Procedure: TRANSESOPHAGEAL ECHOCARDIOGRAM (TEE);  Surgeon: Josue Hector, MD;  Location: [facility] ENDOSCOPY;  Service: Cardiovascular;  Laterality: N/A;    Family History  Problem Relation Age of Onset   Cancer Father        non hodgkin lymphoma & skin   Heart attack Maternal Grandfather    Dementia Mother    Polymyositis Sister     Social History:  reports that she has never smoked. She has  never used smokeless tobacco. She reports that she does not drink alcohol and does not use drugs.  Allergies:  Allergies  Allergen Reactions   Avelox [Moxifloxacin]    Ciprofloxacin Hives   Floraquin [Iodoquinol]     No cipro or others   Iohexol      Code: HIVES, Desc: HIVES S/P IVP MANY YRS AGO    Levaquin [Levofloxacin]    Plavix [Clopidogrel] Diarrhea   Quinolones    Shellfish Allergy Itching    Itching under the chin. Described by patient but not seen by husband.    Medications: I have reviewed the patient's current medications.  Results for orders placed or performed during the hospital encounter of 07/29/21 (from the past 48 hour(s))  Basic metabolic panel     Status: Abnormal   Collection Time: 07/29/21 12:57 PM  Result Value Ref Range   Sodium 136 135 - 145 mmol/L   Potassium 4.3 3.5 - 5.1 mmol/L   Chloride 105 98 - 111 mmol/L   CO2 22 22 - 32 mmol/L   Glucose, Bld 112 (H) 70 - 99 mg/dL    Comment: Glucose reference range applies only to samples taken after fasting for at least 8 hours.   BUN 25 (H) 8 - 23 mg/dL   Creatinine, Ser 0.97 0.44 - 1.00 mg/dL   Calcium 8.7 (L)  8.9 - 10.3 mg/dL   GFR, Estimated >60 >60 mL/min    Comment: (NOTE) Calculated using the CKD-EPI Creatinine Equation (2021)    Anion gap 9 5 - 15    Comment: Performed at [facility], [place] [address], [place], [place] [address]  CBC with Differential     Status: Abnormal   Collection Time: 07/29/21 12:57 PM  Result Value Ref Range   WBC 11.6 (H) 4.0 - 10.5 K/uL   RBC 3.83 (L) 3.87 - 5.11 MIL/uL   Hemoglobin 11.6 (L) 12.0 - 15.0 g/dL   HCT 35.9 (L) 36.0 - 46.0 %   MCV 93.7 80.0 - 100.0 fL   MCH 30.3 26.0 - 34.0 pg   MCHC 32.3 30.0 - 36.0 g/dL   RDW 14.1 11.5 - 15.5 %   Platelets 209 150 - 400 K/uL   nRBC 0.0 0.0 - 0.2 %   Neutrophils Relative % 86 %   Neutro Abs 10.0 (H) 1.7 - 7.7 K/uL   Lymphocytes Relative 7 %   Lymphs Abs 0.8 0.7 - 4.0 K/uL   Monocytes Relative 5 %   Monocytes  Absolute 0.6 0.1 - 1.0 K/uL   Eosinophils Relative 0 %   Eosinophils Absolute 0.0 0.0 - 0.5 K/uL   Basophils Relative 1 %   Basophils Absolute 0.1 0.0 - 0.1 K/uL   Immature Granulocytes 1 %   Abs Immature Granulocytes 0.06 0.00 - 0.07 K/uL    Comment: Performed at [place] [address] [place], [name] [address]  HIV Antibody (routine testing w rflx)     Status: None   Collection Time: 07/30/21  1:37 AM  Result Value Ref Range   HIV Screen 4th Generation wRfx Non Reactive Non Reactive    Comment: Performed at [facility], [place] [address], [place], [name] [address]  CBC     Status: Abnormal   Collection Time: 07/30/21  1:37 AM  Result Value Ref Range   WBC 6.0 4.0 - 10.5 K/uL   RBC 3.25 (L) 3.87 - 5.11 MIL/uL   Hemoglobin 9.8 (L) 12.0 - 15.0 g/dL   HCT 30.1 (L) 36.0 - 46.0 %   MCV 92.6 80.0 - 100.0 fL   MCH 30.2 26.0 - 34.0 pg   MCHC 32.6 30.0 - 36.0 g/dL   RDW 13.9 11.5 - 15.5 %   Platelets 178 150 - 400 K/uL   nRBC 0.0 0.0 - 0.2 %    Comment: Performed at [facility], [place] [address], [place], [place] [address]  Basic metabolic panel     Status: Abnormal   Collection Time: 07/30/21  1:37 AM  Result Value Ref Range   Sodium 134 (L) 135 - 145 mmol/L   Potassium 4.0 3.5 - 5.1 mmol/L   Chloride 105 98 - 111 mmol/L   CO2 23 22 - 32 mmol/L   Glucose, Bld 138 (H) 70 - 99 mg/dL    Comment: Glucose reference range applies only to samples taken after fasting for at least 8 hours.   BUN 19 8 - 23 mg/dL   Creatinine, Ser 0.92 0.44 - 1.00 mg/dL   Calcium 8.7 (L) 8.9 - 10.3 mg/dL   GFR, Estimated >60 >60 mL/min    Comment: (NOTE) Calculated using the CKD-EPI Creatinine Equation (2021)    Anion gap 6 5 - 15    Comment: Performed at [place] [address] [place],  [address]  Surgical pcr  screen     Status: None   Collection Time: 07/30/21  6:10 PM   Specimen: Nasal Mucosa; Nasal Swab  Result Value Ref Range   MRSA, PCR NEGATIVE  NEGATIVE   Staphylococcus aureus NEGATIVE NEGATIVE    Comment: (NOTE) The Xpert SA Assay (FDA approved for NASAL specimens in patients 62 years of age and older), is one component of a comprehensive surveillance program. It is not intended to diagnose infection nor to guide or monitor treatment. Performed at [facility], [place] [address], [place], [place] [address]   CBC     Status: Abnormal   Collection Time: 07/31/21  1:02 AM  Result Value Ref Range   WBC 6.2 4.0 - 10.5 K/uL   RBC 3.09 (L) 3.87 - 5.11 MIL/uL   Hemoglobin 9.3 (L) 12.0 - 15.0 g/dL   HCT 28.8 (L) 36.0 - 46.0 %   MCV 93.2 80.0 - 100.0 fL   MCH 30.1 26.0 - 34.0 pg   MCHC 32.3 30.0 - 36.0 g/dL   RDW 13.9 11.5 - 15.5 %   Platelets 183 150 - 400 K/uL   nRBC 0.0 0.0 - 0.2 %    Comment: Performed at [facility], [place] [address], [place], [place] [address]  Basic metabolic panel     Status: Abnormal   Collection Time: 07/31/21  1:02 AM  Result Value Ref Range   Sodium 137 135 - 145 mmol/L   Potassium 4.0 3.5 - 5.1 mmol/L   Chloride 106 98 - 111 mmol/L   CO2 24 22 - 32 mmol/L   Glucose, Bld 113 (H) 70 - 99 mg/dL    Comment: Glucose reference range applies only to samples taken after fasting for at least 8 hours.   BUN 14 8 - 23 mg/dL   Creatinine, Ser 0.84 0.44 - 1.00 mg/dL   Calcium 8.9 8.9 - 10.3 mg/dL   GFR, Estimated >60 >60 mL/min    Comment: (NOTE) Calculated using the CKD-EPI Creatinine Equation (2021)    Anion gap 7 5 - 15    Comment: Performed at [place] [address] [place], [place] [address]  Protime-INR     Status: None   Collection Time: 07/31/21  1:02 AM  Result Value Ref Range   Prothrombin Time 14.5 11.4 - 15.2 seconds   INR 1.1 0.8 - 1.2    Comment: (NOTE) INR goal varies based on device and disease states. Performed at [facility], [place] [address] [place], [place] [address]     CT ELBOW LEFT WO CONTRAST  Result Date: 07/29/2021 CLINICAL DATA:  Left elbow  fracture EXAM: CT OF THE UPPER LEFT EXTREMITY WITHOUT CONTRAST TECHNIQUE: Multidetector CT imaging of the upper left extremity was performed according to the standard protocol. RADIATION DOSE REDUCTION: This exam was performed according to the departmental dose-optimization program which includes automated exposure control, adjustment of the mA and/or kV according to patient size and/or use of iterative reconstruction technique. COMPARISON:  X-ray 07/29/2021 FINDINGS: Bones/Joint/Cartilage Acute heavily comminuted fracture of the distal humerus. Overall fracture alignment is significantly laterally displaced and mildly angulated. Relatively nondisplaced intra-articular component within the trochlea centrally. Supracondylar and transcondylar components with numerous displaced and angulated fracture fragments. There is a skin wound medially with air present within the soft tissues and within the joint compatible with open fracture and traumatic arthrotomy. Elbow joint alignment is maintained without dislocation. The proximal radius and ulna appear intact without evidence of fracture.  Moderate-sized elbow joint hemarthrosis. Ligaments Suboptimally assessed by CT. Muscles and Tendons Suboptimally assessed but suspected posttraumatic intramuscular hematoma about the elbow with enlarged, heterogeneous appearance of the muscles. Soft tissues Diffuse soft tissue swelling about the elbow with medial skin wound and associated soft tissue air. IMPRESSION: 1. Acute heavily comminuted displaced and angulated intra-articular fracture of the distal humerus, as described above. 2. Moderate-sized elbow joint hemarthrosis. 3. Diffuse soft tissue swelling about the elbow with medial skin wound and associated soft tissue air compatible with open fracture and traumatic arthrotomy. Electronically Signed   By: Davina Poke D.O.   On: 07/29/2021 16:13   DG Humerus Left  Result Date: 07/29/2021 CLINICAL DATA:  Fall with LEFT arm  pain. EXAM: LEFT HUMERUS - 2 VIEW COMPARISON:  None Available. FINDINGS: A comminuted displaced intra-articular distal humeral fracture is identified. Limited evaluation secondary to patient inability to position. The proximal radius and ulna or difficult to fully evaluate on this study. IMPRESSION: 1. Comminuted displaced intra-articular distal humeral fracture. 2. Proximal radius and ulna not fully evaluated on this study. Dedicated elbow study is recommended for further evaluation. Electronically Signed   By: Margarette Canada M.D.   On: 07/29/2021 12:25    Review of Systems  HENT:  Negative for ear discharge, ear pain, hearing loss and tinnitus.   Eyes:  Negative for photophobia and pain.  Respiratory:  Negative for cough and shortness of breath.   Cardiovascular:  Negative for chest pain.  Gastrointestinal:  Negative for abdominal pain, nausea and vomiting.  Genitourinary:  Negative for dysuria, flank pain, frequency and urgency.  Musculoskeletal:  Positive for arthralgias (Left elbow). Negative for back pain, myalgias and neck pain.  Neurological:  Negative for dizziness and headaches.  Hematological:  Does not bruise/bleed easily.  Psychiatric/Behavioral:  The patient is not nervous/anxious.    Blood pressure 120/70, pulse 82, temperature 99.2 F (37.3 C), temperature source Oral, resp. rate 16, last menstrual period 01/02/2003, SpO2 100 %. Physical Exam Constitutional:      General: She is not in acute distress.    Appearance: She is well-developed. She is not diaphoretic.  HENT:     Head: Normocephalic and atraumatic.  Eyes:     General: No scleral icterus.       Right eye: No discharge.        Left eye: No discharge.     Conjunctiva/sclera: Conjunctivae normal.  Cardiovascular:     Rate and Rhythm: Normal rate and regular rhythm.  Pulmonary:     Effort: Pulmonary effort is normal. No respiratory distress.  Musculoskeletal:     Cervical back: Normal range of motion.     Comments:  Left shoulder, elbow, wrist, digits- no skin wounds, elbow splint in place, no instability, no blocks to motion  Sens  Ax/R/M/U intact  Mot   Ax/ R/ PIN/ M/ AIN/ U grossly intact  Skin:    General: Skin is warm and dry.  Neurological:     Mental Status: She is alert.  Psychiatric:        Mood and Affect: Mood normal.        Behavior: Behavior normal.     Assessment/Plan: Left open distal humerus fx -- Plan I&D, ORIF today with Dr. Doreatha Martin. Please keep NPO.    Lisette Abu, PA-C Orthopedic Surgery [phone] 07/31/2021, 9:28 AM

## 2021-07-31 NOTE — Anesthesia Preprocedure Evaluation (Addendum)
Anesthesia Evaluation  Patient identified by MRN, date of birth, ID band Patient awake    Reviewed: Allergy & Precautions, NPO status , Patient's Chart, lab work & pertinent test results  Airway Mallampati: I  TM Distance: >3 FB Neck ROM: Full    Dental no notable dental hx. (+) Teeth Intact, Dental Advisory Given   Pulmonary neg pulmonary ROS,    Pulmonary exam normal breath sounds clear to auscultation       Cardiovascular hypertension, Pt. on medications Normal cardiovascular exam Rhythm:Regular Rate:Normal  TTE 2022 1. Left ventricular ejection fraction, by estimation, is 60 to 65%. The  left ventricle has normal function. The left ventricle has no regional  wall motion abnormalities.  2. Right ventricular systolic function is normal. The right ventricular  size is normal.  3. No left atrial/left atrial appendage thrombus was detected.  4. The mitral valve is abnormal. Mild mitral valve regurgitation. No  evidence of mitral stenosis.  5. The aortic valve is tricuspid. Aortic valve regurgitation is not  visualized. No aortic stenosis is present.  6. The inferior vena cava is normal in size with greater than 50%  respiratory variability, suggesting right atrial pressure of 3 mmHg.  7. Agitated saline contrast bubble study was negative, with no evidence  of any interatrial shunt.    Neuro/Psych PSYCHIATRIC DISORDERS Anxiety Depression Dementia CVA (s/p loop recorder), Residual Symptoms    GI/Hepatic negative GI ROS, Neg liver ROS,   Endo/Other  negative endocrine ROS  Renal/GU negative Renal ROS  negative genitourinary   Musculoskeletal negative musculoskeletal ROS (+)   Abdominal   Peds  Hematology  (+) Blood dyscrasia (on brilinta), anemia , Lab Results      Component                Value               Date                      WBC                      6.2                 07/31/2021                HGB                       9.3 (L)             07/31/2021                HCT                      28.8 (L)            07/31/2021                MCV                      93.2                07/31/2021                PLT                      183                 07/31/2021  Anesthesia Other Findings  66 y.o. female with medical history significant of cryptogenic stroke x3 with residual partial vision field deficit, left foot drop and right arm and hand weakness, status post IR guided thrombectomy, HTN, HLD, came with elbow fracture  Reproductive/Obstetrics                            Anesthesia Physical Anesthesia Plan  ASA: 3  Anesthesia Plan: Regional and General   Post-op Pain Management: Tylenol PO (pre-op)* and Regional block*   Induction: Intravenous  PONV Risk Score and Plan: 3 and Propofol infusion, Treatment may vary due to age or medical condition, Midazolam and Ondansetron  Airway Management Planned: Oral ETT  Additional Equipment:   Intra-op Plan:   Post-operative Plan: Extubation in OR  Informed Consent: I have reviewed the patients History and Physical, chart, labs and discussed the procedure including the risks, benefits and alternatives for the proposed anesthesia with the patient or authorized representative who has indicated his/her understanding and acceptance.     Dental advisory given  Plan Discussed with: CRNA  Anesthesia Plan Comments:        Anesthesia Quick Evaluation

## 2021-07-31 NOTE — Transfer of Care (Signed)
Immediate Anesthesia Transfer of Care Note  Patient: Jacqueline Bonilla  Procedure(s) Performed: OPEN REDUCTION INTERNAL FIXATION (ORIF) DISTAL HUMERUS FRACTURE (Left)  Patient Location: PACU  Anesthesia Type:General  Level of Consciousness: drowsy and patient cooperative  Airway & Oxygen Therapy: Patient Spontanous Breathing  Post-op Assessment: Report given to RN and Post -op Vital signs reviewed and stable  Post vital signs: Reviewed and stable  Last Vitals:  Vitals Value Taken Time  BP 131/75   Temp    Pulse 107   Resp 15   SpO2 98     Last Pain:  Vitals:   07/31/21 1349  TempSrc:   PainSc: 4       Patients Stated Pain Goal: 2 (01/74/94 4967)  Complications:  Encounter Notable Events  Notable Event Outcome Phase Comment  Difficult to intubate - expected  Intraprocedure Filed from anesthesia note documentation.

## 2021-07-31 NOTE — Progress Notes (Signed)
PT Cancellation Note  Patient Details Name: Jacqueline Bonilla MRN: [id number] DOB: 1955/02/24   Cancelled Treatment:    Reason Eval/Treat Not Completed: Other (comment)  Off unit to OR;   Will follow up later today as time allows;  Otherwise, will follow up for PT tomorrow;   Thank you,  Roney Marion, [place] [facility] [place] 07/31/2021, 1:47 PM

## 2021-07-31 NOTE — Progress Notes (Signed)
Patient off unit, OR

## 2021-07-31 NOTE — Anesthesia Procedure Notes (Signed)
Procedure Name: Intubation Date/Time: 07/31/2021 3:30 PM  Performed by: Mariea Clonts, CRNAPre-anesthesia Checklist: Patient identified, Emergency Drugs available, Suction available and Patient being monitored Patient Re-evaluated:Patient Re-evaluated prior to induction Oxygen Delivery Method: Circle System Utilized Preoxygenation: Pre-oxygenation with 100% oxygen Induction Type: IV induction Ventilation: Mask ventilation without difficulty and Oral airway inserted - appropriate to patient size Laryngoscope Size: Glidescope and 3 Grade View: Grade II Tube type: Oral Tube size: 7.0 mm Number of attempts: 1 Airway Equipment and Method: Stylet and Oral airway Placement Confirmation: ETT inserted through vocal cords under direct vision, positive ETCO2 and breath sounds checked- equal and bilateral Tube secured with: Tape Dental Injury: Teeth and Oropharynx as per pre-operative assessment  Difficulty Due To: Difficulty was anticipated and Difficult Airway- due to anterior larynx

## 2021-08-01 ENCOUNTER — Inpatient Hospital Stay (HOSPITAL_COMMUNITY): Payer: Medicare HMO

## 2021-08-01 DIAGNOSIS — Z8673 Personal history of transient ischemic attack (TIA), and cerebral infarction without residual deficits: Secondary | ICD-10-CM | POA: Diagnosis not present

## 2021-08-01 DIAGNOSIS — S42402A Unspecified fracture of lower end of left humerus, initial encounter for closed fracture: Secondary | ICD-10-CM | POA: Diagnosis not present

## 2021-08-01 DIAGNOSIS — D649 Anemia, unspecified: Secondary | ICD-10-CM | POA: Diagnosis not present

## 2021-08-01 DIAGNOSIS — I10 Essential (primary) hypertension: Secondary | ICD-10-CM | POA: Diagnosis not present

## 2021-08-01 DIAGNOSIS — I951 Orthostatic hypotension: Secondary | ICD-10-CM

## 2021-08-01 LAB — CBC
HCT: 25.7 % — ABNORMAL LOW (ref 36.0–46.0)
Hemoglobin: 8.3 g/dL — ABNORMAL LOW (ref 12.0–15.0)
MCH: 30.2 pg (ref 26.0–34.0)
MCHC: 32.3 g/dL (ref 30.0–36.0)
MCV: 93.5 fL (ref 80.0–100.0)
Platelets: 181 10*3/uL (ref 150–400)
RBC: 2.75 MIL/uL — ABNORMAL LOW (ref 3.87–5.11)
RDW: 14 % (ref 11.5–15.5)
WBC: 8.2 10*3/uL (ref 4.0–10.5)
nRBC: 0 % (ref 0.0–0.2)

## 2021-08-01 LAB — BASIC METABOLIC PANEL
Anion gap: 6 (ref 5–15)
BUN: 13 mg/dL (ref 8–23)
CO2: 27 mmol/L (ref 22–32)
Calcium: 8.3 mg/dL — ABNORMAL LOW (ref 8.9–10.3)
Chloride: 101 mmol/L (ref 98–111)
Creatinine, Ser: 1.05 mg/dL — ABNORMAL HIGH (ref 0.44–1.00)
GFR, Estimated: 58 mL/min — ABNORMAL LOW (ref >60)
Glucose, Bld: 223 mg/dL — ABNORMAL HIGH (ref 70–99)
Potassium: 4.2 mmol/L (ref 3.5–5.1)
Sodium: 134 mmol/L — ABNORMAL LOW (ref 135–145)

## 2021-08-01 LAB — HEMOGLOBIN AND HEMATOCRIT, BLOOD
HCT: 24.3 % — ABNORMAL LOW (ref 36.0–46.0)
Hemoglobin: 8.1 g/dL — ABNORMAL LOW (ref 12.0–15.0)

## 2021-08-01 LAB — GLUCOSE, CAPILLARY: Glucose-Capillary: 114 mg/dL — ABNORMAL HIGH (ref 70–99)

## 2021-08-01 MED ORDER — DEXAMETHASONE SODIUM PHOSPHATE 10 MG/ML IJ SOLN
INTRAMUSCULAR | Status: DC | PRN
  Administered 2021-07-31: 5 mg

## 2021-08-01 MED ORDER — TICAGRELOR 90 MG PO TABS
90.0000 mg | ORAL_TABLET | Freq: Two times a day (BID) | ORAL | Status: DC
  Administered 2021-08-01 – 2021-08-02 (×2): 90 mg via ORAL
  Filled 2021-08-01 (×6): qty 1

## 2021-08-01 MED ORDER — ASPIRIN 81 MG PO TBEC
81.0000 mg | DELAYED_RELEASE_TABLET | Freq: Every day | ORAL | Status: DC
Start: 2021-08-02 — End: 2021-08-02
  Administered 2021-08-02: 81 mg via ORAL
  Filled 2021-08-01: qty 1

## 2021-08-01 MED ORDER — LACTATED RINGERS IV BOLUS
500.0000 mL | Freq: Once | INTRAVENOUS | Status: AC
  Administered 2021-08-01: 500 mL via INTRAVENOUS

## 2021-08-01 MED ORDER — ROPIVACAINE HCL 5 MG/ML IJ SOLN
INTRAMUSCULAR | Status: DC | PRN
  Administered 2021-07-31: 20 mL via PERINEURAL

## 2021-08-01 MED ORDER — MORPHINE SULFATE (PF) 2 MG/ML IV SOLN
2.0000 mg | INTRAVENOUS | Status: DC | PRN
  Administered 2021-08-01: 2 mg via INTRAVENOUS
  Filled 2021-08-01: qty 1

## 2021-08-01 MED ORDER — OXYCODONE HCL 5 MG PO TABS: 5.0000 mg | ORAL_TABLET | Freq: Four times a day (QID) | ORAL | 0 refills | Status: DC | PRN

## 2021-08-01 MED ORDER — ACETAMINOPHEN 500 MG PO TABS: 1000.0000 mg | ORAL_TABLET | Freq: Three times a day (TID) | ORAL | 0 refills | Status: DC

## 2021-08-01 MED ORDER — MEPIVACAINE HCL (PF) 2 % IJ SOLN
INTRAMUSCULAR | Status: DC | PRN
  Administered 2021-07-31: 10 mL

## 2021-08-01 NOTE — Assessment & Plan Note (Addendum)
Post op this morning, BP dropped to 70/40 with standing, was symptomatic.  In setting of recent IV morphine.  - Hold losartan - Fluids now - Check orthostatics again in afternoon - Trend Hgb this afternoon given 11 >> 8 g/dL pre-to-post op, transfuse for Hgb < 7g/dL

## 2021-08-01 NOTE — Anesthesia Procedure Notes (Addendum)
Anesthesia Regional Block: Supraclavicular block   Pre-Anesthetic Checklist: , timeout performed,  Correct Patient, Correct Site, Correct Laterality,  Correct Procedure, Correct Position, site marked,  Risks and benefits discussed,  Pre-op evaluation,  At surgeon's request and post-op pain management  Laterality: Left  Prep: Maximum Sterile Barrier Precautions used, chloraprep       Needles:  Injection technique: Single-shot  Needle Type: Echogenic Stimulator Needle     Needle Length: 5cm  Needle Gauge: 21     Additional Needles:   Procedures:,,,, ultrasound used (permanent image in chart),,    Narrative:  Start time: 07/31/2021 2:53 PM End time: 07/31/2021 2:56 PM Injection made incrementally with aspirations every 5 mL. Anesthesiologist: Freddrick March, MD

## 2021-08-01 NOTE — Discharge Instructions (Signed)
Orthopaedic Trauma Service Discharge Instructions   General Discharge Instructions  Orthopaedic Injuries:  Open left distal humerus fracture treated with open reduction and internal fixation  WEIGHT BEARING STATUS: Nonweightbearing left upper extremity  RANGE OF MOTION/ACTIVITY: Unrestricted range of motion left elbow.  No active extension of left elbow against resistance  Bone health:  Review the following resource for additional information regarding bone health  [email or web address]  Wound Care: Daily wound care starting when you get home.  See below   Discharge Wound Care Instructions  Do NOT apply any ointments, solutions or lotions to pin sites or surgical wounds.  These prevent needed drainage and even though solutions like hydrogen peroxide kill bacteria, they also damage cells lining the pin sites that help fight infection.  Applying lotions or ointments can keep the wounds moist and can cause them to breakdown and open up as well. This can increase the risk for infection. When in doubt call the office.  Surgical incisions should be dressed daily.  If any drainage is noted, use one layer of adaptic or Mepitel, then gauze, Kerlix, and an ace wrap.  [email or web address]  [email or web address]  These dressing supplies should be available at local medical supply stores ([facility] [employer], [facility], etc). They are not usually carried at places like [facility], [facility], [facility], etc  Once the incision is completely dry and without drainage, it may be left open to air out.  Showering may begin 36-48 hours later.  Cleaning gently with soap and water.  Traumatic wounds should be dressed daily as well.    One layer of adaptic,  gauze, Kerlix, then ace wrap.  The adaptic can be discontinued once the draining has ceased    If you have a wet to dry dressing: wet the gauze with saline the squeeze as much saline out so the gauze is moist (not soaking wet), place moistened gauze over wound, then place a dry gauze over the moist one, followed by Kerlix wrap, then ace wrap.   Diet: as you were eating previously.  Can use over the counter stool softeners and bowel preparations, such as Miralax, to help with bowel movements.  Narcotics can be constipating.  Be sure to drink plenty of fluids  PAIN MEDICATION USE AND EXPECTATIONS  You have likely been given narcotic medications to help control your pain.  After a traumatic event that results in an fracture (broken bone) with or without surgery, it is ok to use narcotic pain medications to help control one's pain.  We understand that everyone responds to pain differently and each individual patient will be evaluated on a regular basis for the continued need for narcotic medications. Ideally, narcotic medication use should last no more than 6-8 weeks (coinciding with fracture healing).   As a patient it is your responsibility as well to monitor narcotic medication use and report the amount and frequency you use these medications when you come to your office visit.   We would also advise that if you are using narcotic medications, you should take a dose prior to therapy to maximize you participation.  IF YOU ARE ON NARCOTIC MEDICATIONS IT IS NOT PERMISSIBLE TO OPERATE A MOTOR VEHICLE (MOTORCYCLE/CAR/TRUCK/MOPED) OR HEAVY MACHINERY DO NOT MIX NARCOTICS WITH OTHER CNS (CENTRAL NERVOUS SYSTEM) DEPRESSANTS SUCH AS ALCOHOL   POST-OPERATIVE OPIOID TAPER INSTRUCTIONS: It is important to wean off of your opioid medication as soon as possible. If you do not need pain medication after your surgery it is ok to stop day  one. Opioids include: Codeine, Hydrocodone(Norco, Vicodin), Oxycodone(Percocet,  oxycontin) and hydromorphone amongst others.  Long term and even short term use of opiods can cause: Increased pain response Dependence Constipation Depression Respiratory depression And more.  Withdrawal symptoms can include Flu like symptoms Nausea, vomiting And more Techniques to manage these symptoms Hydrate well Eat regular healthy meals Stay active Use relaxation techniques(deep breathing, meditating, yoga) Do Not substitute Alcohol to help with tapering If you have been on opioids for less than two weeks and do not have pain than it is ok to stop all together.  Plan to wean off of opioids This plan should start within one week post op of your fracture surgery  Maintain the same interval or time between taking each dose and first decrease the dose.  Cut the total daily intake of opioids by one tablet each day Next start to increase the time between doses. The last dose that should be eliminated is the evening dose.    STOP SMOKING OR USING NICOTINE PRODUCTS!!!!  As discussed nicotine severely impairs your body's ability to heal surgical and traumatic wounds but also impairs bone healing.  Wounds and bone heal by forming microscopic blood vessels (angiogenesis) and nicotine is a vasoconstrictor (essentially, shrinks blood vessels).  Therefore, if vasoconstriction occurs to these microscopic blood vessels they essentially disappear and are unable to deliver necessary nutrients to the healing tissue.  This is one modifiable factor that you can do to dramatically increase your chances of healing your injury.    (This means no smoking, no nicotine gum, patches, etc)  DO NOT USE NONSTEROIDAL ANTI-INFLAMMATORY DRUGS (NSAID'S)  Using products such as Advil (ibuprofen), Aleve (naproxen), Motrin (ibuprofen) for additional pain control during fracture healing can delay and/or prevent the healing response.  If you would like to take over the counter (OTC) medication, Tylenol (acetaminophen)  is ok.  However, some narcotic medications that are given for pain control contain acetaminophen as well. Therefore, you should not exceed more than 4000 mg of tylenol in a day if you do not have liver disease.  Also note that there are may OTC medicines, such as cold medicines and allergy medicines that my contain tylenol as well.  If you have any questions about medications and/or interactions please ask your doctor/PA or your pharmacist.      ICE AND ELEVATE INJURED/OPERATIVE EXTREMITY  Using ice and elevating the injured extremity above your heart can help with swelling and pain control.  Icing in a pulsatile fashion, such as 20 minutes on and 20 minutes off, can be followed.    Do not place ice directly on skin. Make sure there is a barrier between to skin and the ice pack.    Using frozen items such as frozen peas works well as the conform nicely to the are that needs to be iced.  USE AN ACE WRAP OR TED HOSE FOR SWELLING CONTROL  In addition to icing and elevation, Ace wraps or TED hose are used to help limit and resolve swelling.  It is recommended to use Ace wraps or TED hose until you are informed to stop.    When using Ace Wraps start the wrapping distally (farthest away from the body) and wrap proximally (closer to the body)   Example: If you had surgery on your leg or thing and you do not have a splint on, start the ace wrap at the toes and work your way up to the thigh  If you had surgery on your upper extremity and do not have a splint on, start the ace wrap at your fingers and work your way up to the upper arm  IF YOU ARE IN A SPLINT OR CAST DO NOT REMOVE IT FOR ANY REASON   If your splint gets wet for any reason please contact the office immediately. You may shower in your splint or cast as long as you keep it dry.  This can be done by wrapping in a cast cover or garbage back (or similar)  Do Not stick any thing down your splint or cast such as pencils, money, or hangers to try  and scratch yourself with.  If you feel itchy take benadryl as prescribed on the bottle for itching  IF YOU ARE IN A CAM BOOT (BLACK BOOT)  You may remove boot periodically. Perform daily dressing changes as noted below.  Wash the liner of the boot regularly and wear a sock when wearing the boot. It is recommended that you sleep in the boot until told otherwise    Call office for the following: Temperature greater than 101F Persistent nausea and vomiting Severe uncontrolled pain Redness, tenderness, or signs of infection (pain, swelling, redness, odor or green/yellow discharge around the site) Difficulty breathing, headache or visual disturbances Hives Persistent dizziness or light-headedness Extreme fatigue Any other questions or concerns you may have after discharge  In an emergency, call 911 or go to an Emergency Department at a nearby hospital  HELPFUL INFORMATION  If you had a block, it will wear off between 8-24 hrs postop typically.  This is period when your pain may go from nearly zero to the pain you would have had postop without the block.  This is an abrupt transition but nothing dangerous is happening.  You may take an extra dose of narcotic when this happens.  You should wean off your narcotic medicines as soon as you are able.  Most patients will be off or using minimal narcotics before their first postop appointment.   We suggest you use the pain medication the first night prior to going to bed, in order to ease any pain when the anesthesia wears off. You should avoid taking pain medications on an empty stomach as it will make you nauseous.  Do not drink alcoholic beverages or take illicit drugs when taking pain medications.  In most states it is against the law to drive while you are in a splint or sling.  And certainly against the law to drive while taking narcotics.  You may return to work/school in the next couple of days when you feel up to it.   Pain medication  may make you constipated.  Below are a few solutions to try in this order: Decrease the amount of pain medication if you aren't having pain. Drink lots of decaffeinated fluids. Drink prune juice and/or each dried prunes  If the first 3 don't work start with additional solutions Take Colace - an over-the-counter stool softener Take Senokot - an over-the-counter laxative Take Miralax - a stronger over-the-counter laxative     CALL THE OFFICE WITH ANY QUESTIONS OR CONCERNS: [phone]   VISIT OUR WEBSITE FOR ADDITIONAL INFORMATION: [email or web address]

## 2021-08-01 NOTE — Care Management Important Message (Signed)
Important Message  Patient Details  Name: Jacqueline Bonilla MRN: [id number] Date of Birth: 04-Aug-1955   Medicare Important Message Given:  Yes     Hannah Beat 08/01/2021, 12:12 PM

## 2021-08-01 NOTE — Evaluation (Addendum)
Physical Therapy Evaluation Patient Details Name: Jacqueline Bonilla MRN: [id number] DOB: 20-Aug-1955 Today's Date: 08/01/2021  History of Present Illness  Pt is a 67 y.o. 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 Patient tripped on a piece of stone this morning while taking a walk around her house, denies any new weakness of any other arm or legs, no loss of consciousness no head injuries.  She did hit her left elbow on the ground and excruciating pain afterwards. Also developed a large hematoma around left elbow and severe pain tenderness. Pt now is s/p ORIF left distal humeurs fracture, ulnar osteotomy, and I & D.  Clinical Impression  Pt agreeable to physical therapy evaluation/treatment session. Pt limited historian 2/2 baseline dementia. Pt performing bed mobility, transfers, and gait at mostly minimal assistance level. However, pt at relatively high risk for future falls and needs constant supervision following surgery due to her cognition and physical deficits. Pt currently presents with functional limitations secondary to impairments listed in PT problem list. Pt to benefit from skilled, acute care physical therapy interventions to maximize her balance, strength, functional abilities, and quality of life. Will progress with balance training, therapeutic exercise instruction, and gait training (trial with single point cane).       Recommendations for follow up therapy are one component of a multi-disciplinary discharge planning process, led by the attending physician.  Recommendations may be updated based on patient status, additional functional criteria and insurance authorization.  Follow Up Recommendations Home health PT (if pt has 24/7 supervision; otherwise SNF)      Assistance  Recommended at Discharge Frequent or constant Supervision/Assistance  Patient can return home with the following  A little help with walking and/or transfers;A little help with bathing/dressing/bathroom;Assistance with cooking/housework;Direct supervision/assist for medications management;Direct supervision/assist for financial management;Assist for transportation;Help with stairs or ramp for entrance    Equipment Recommendations None recommended by PT  Recommendations for Other Services       Functional Status Assessment Patient has had a recent decline in their functional status and demonstrates the ability to make significant improvements in function in a reasonable and predictable amount of time.     Precautions / Restrictions Precautions Precautions: Fall Restrictions Weight Bearing Restrictions: Yes LUE Weight Bearing: Non weight bearing Other Position/Activity Restrictions: Gentle left elbow motion allowed but no active extension of left elbow against resistance (no pushing up with left arm to get out of a chair or similar)     Mobility  Bed Mobility Overal bed mobility: Needs Assistance Bed Mobility: Supine to Sit     Supine to sit: Min assist, HOB elevated     General bed mobility comments: Min HHA required including for scooting to EOB    Transfers Overall transfer level: Needs assistance Equipment used: 1 person hand held assist Transfers: Sit to/from Stand Sit to Stand: Min assist, Min guard           General transfer comment: Pt performed four sit to stands during this encounter including one from bed and three from chair (two consecutive).    Ambulation/Gait Ambulation/Gait assistance: Min guard, Min assist Gait Distance (Feet): 200 Feet Assistive device: 1 person hand held assist, None Gait Pattern/deviations: Decreased step length - right, Decreased step length - left, Step-through pattern Gait velocity: decreased Gait velocity interpretation: 1.31 -  2.62  ft/sec, indicative of limited community ambulator   General Gait Details: Pt ambulates with short, shuffling steps. Pt with unsteadiness but no overt LOB occurred. Pt challenged with no HHA and amount of unsteadiness increased.  Stairs            Wheelchair Mobility    Modified Rankin (Stroke Patients Only)       Balance Overall balance assessment: Needs assistance Sitting-balance support: No upper extremity supported Sitting balance-Leahy Scale: Good     Standing balance support: Single extremity supported, No upper extremity supported Standing balance-Leahy Scale: Fair Standing balance comment: Pt demonstrates moderate unsteadiness with FTEO while using R HHA and CGA. Pt demonstrates moderate to severe unsteadiness with FTEO and no HHA requiring moderate assistance to maintain balance.                             Pertinent Vitals/Pain Pain Assessment Pain Assessment: 0-10 Pain Score: 0-No pain Pain Location: left shoulder Pain Intervention(s): Limited activity within patient's tolerance, Monitored during session    [place] expects to be discharged to:: Private residence Living Arrangements: Spouse/significant other Available Help at Discharge: Family;Available 24 hours/day (between husband, son, and step daughter) Type of Home: House Home Access: Ramped entrance       Home Layout: One level Home Equipment: Grab bars - toilet;Grab bars - tub/shower;Cane - single point      Prior Function Prior Level of Function : Independent/Modified Independent;History of Falls (last six months) (pt does not drive)             Mobility Comments: Does not use AD. ADLs Comments: Pt independent with basic ADL's including bathing, dressing, feeding. Pt's husband cooks and takes care of pt's errands. Pt's husband helps manage her medications and finances.     Hand Dominance   Dominant Hand: Right    Extremity/Trunk Assessment   Upper  Extremity Assessment Upper Extremity Assessment: Defer to OT evaluation    Lower Extremity Assessment Lower Extremity Assessment:  (R ankle DF 4+/5, L ankle DF 4/5, hip flexors at least 3+/5, quads 4/5 bilaterally)       Communication   Communication: No difficulties  Cognition Arousal/Alertness: Awake/alert Behavior During Therapy: WFL for tasks assessed/performed Overall Cognitive Status: History of cognitive impairments - at baseline (pt has dementia)                                 General Comments: Pt A and O x 4. Pt has h/o dementia with memory deficits evident. Pt incorrectly identifies time on analog clock.        General Comments      Exercises     Assessment/Plan    PT Assessment Patient needs continued PT services  PT Problem List Decreased strength;Decreased range of motion;Decreased activity tolerance;Decreased balance;Decreased mobility;Decreased cognition;Pain       PT Treatment Interventions DME instruction;Gait training;Functional mobility training;Therapeutic activities;Therapeutic exercise;Balance training;Neuromuscular re-education;Patient/family education (ice as needed for pain)    PT Goals (Current goals can be found in the Care Plan section)  Acute Rehab PT Goals Patient Stated Goal: none stated PT Goal Formulation: With patient Time For Goal Achievement: 08/15/21 Potential to Achieve Goals:  (fairly good)    Frequency Min 3X/week     Co-evaluation               AM-PAC PT "6 Clicks" Mobility  Outcome  Measure Help needed turning from your back to your side while in a flat bed without using bedrails?: A Little Help needed moving from lying on your back to sitting on the side of a flat bed without using bedrails?: A Little Help needed moving to and from a bed to a chair (including a wheelchair)?: A Little Help needed standing up from a chair using your arms (e.g., wheelchair or bedside chair)?: A Little Help needed to walk  in hospital room?: A Little Help needed climbing 3-5 steps with a railing? : A Lot 6 Click Score: 17    End of Session Equipment Utilized During Treatment: Gait belt Activity Tolerance: Patient tolerated treatment well Patient left: in chair;with call bell/phone within reach;with chair alarm set Nurse Communication: Mobility status;Weight bearing status;Precautions PT Visit Diagnosis: Unsteadiness on feet (R26.81);Other abnormalities of gait and mobility (R26.89);History of falling (Z91.81);Muscle weakness (generalized) (M62.81)    Time: 0511-0211 PT Time Calculation (min) (ACUTE ONLY): 26 min   Charges:   PT Evaluation $PT Eval Low Complexity: 1 Low PT Treatments $Therapeutic Activity: 8-22 mins       Donna Bernard, PT   [facility] 08/01/2021, 3:29 PM

## 2021-08-01 NOTE — Progress Notes (Signed)
  Progress Note   Patient: Jacqueline Bonilla NLZ:[id number] DOB: 1955/03/28 DOA: 07/29/2021     3 DOS: the patient was seen and examined on 08/01/2021 at 11:50AM      Brief hospital course: Mrs. Finazzo is a 66 y.o. 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   7/29: CT elbow showed comminuted left elbow fx; Ortho recommended ORIF Monday 7/31: To the OR for elbow ORIF 8/1: Some orthostasis     Assessment and Plan: * Elbow fracture, left, closed, initial encounter S/p open reduction internal fixation of left distal humerus fracture, ulnar osteotomy, and irrigation and debridement of left open distal humerus fracture 7/31 by Dr. Doreatha Martin - Consult orthopedics, appreciate expertise - PT eval - NWB LUE - No active extension L elbow - Home DAPT for DVT ppx - Dexa scan after discharge      Orthostatic hypotension Post op this morning, BP dropped to 70/40 with standing, was symptomatic.  In setting of recent IV morphine.  - Hold losartan - Fluids now - Check orthostatics again in afternoon - Trend Hgb this afternoon given 11 >> 8 g/dL pre-to-post op, transfuse for Hgb < 7g/dL  History of stroke - Continue aspirin and atorvastatin - Resume Brilinta post-op now  Normocytic anemia Hgb dropped to 8.3 today.   - Trend Hgb this afternoon - Okay to give Brilinta now, but attn on Hgb trend - Transfusion threshold 7 g/dL  Vascular dementia without behavioral disturbance (HCC) - Continue donepezil  Essential hypertension BP soft - Hold losartan           Subjective: No fever, headache, chest pain, dyspnea, vomiting.  Tolerating diet, little dizzy with standing this morning after getting morphine.     Physical Exam: Vitals:   08/01/21 0048 08/01/21 0538 08/01/21 0758 08/01/21 0802  BP: 130/78 108/64 (!) 73/59 98/63  Pulse: 95 86  73   Resp: 17   17  Temp:  98.7 F (37.1 C)  98.6 F (37 C)  TempSrc:  Oral  Oral  SpO2: 98% 100%  97%  Weight:      Height:       Thin adult female, lying in bed, no acute distress, interactive and appropriate RRR, no murmurs, no peripheral edema Respiratory rate normal, lungs clear without rales or wheezes Right arm movement normal, left arm in a sling, not tested due to pain, face symmetric, speech fluent, attention normal, oriented to person, place, and time  Data Reviewed: Basic metabolic panel shows creatinine stable, sodium 134 Hemogram shows hemoglobin of 8.3       Disposition: Status is: Inpatient The patient was admitted for an elbow fracture.  She had a ORIF on 7/31.  Today she is orthostatic and has had a marked drop in hemoglobin, we will need to watch this to see if she needs transfusion.  If her hemoglobin stays stable, and she is stable on her aspirin and Brilinta, we will plan for discharge to home with home health as early as tomorrow if safe dispo can be arranged        Author: Edwin Dada, MD 08/01/2021 12:09 PM  For on call review [email or web address].

## 2021-08-01 NOTE — Evaluation (Signed)
Occupational Therapy Evaluation Patient Details Name: Jacqueline Bonilla MRN: [id number] DOB: [birth year] Today's Date: 08/01/2021   History of Present Illness 66 y.o. 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   Clinical Impression   Patient is s/p ORIF L distal humerus surgery resulting in functional limitations due to the deficits listed below (see OT problem list). Pt will requires (A) at this time for basic transfers. Pt will do better in familiar environment due to visual deficits. Pt benefits from edema management with pillow elevation.  Patient will benefit from skilled OT acutely to increase independence and safety with ADLS to allow discharge SNF (unless family can give home (A) ).       Recommendations for follow up therapy are one component of a multi-disciplinary discharge planning process, led by the attending physician.  Recommendations may be updated based on patient status, additional functional criteria and insurance authorization.   Follow Up Recommendations  Skilled nursing-short term rehab (<3 hours/day) (pending 24/7 (A) for home d/c if not availabel will need SNF)    Assistance Recommended at Discharge Intermittent Supervision/Assistance  Patient can return home with the following A little help with walking and/or transfers;A little help with bathing/dressing/bathroom;Direct supervision/assist for medications management;Direct supervision/assist for financial management;Assist for transportation    Functional Status Assessment  Patient has had a recent decline in their functional status and demonstrates the ability to make significant improvements in function in a reasonable and predictable amount of time.  Equipment Recommendations  Other (comment) (tba)     Recommendations for Other Services       Precautions / Restrictions Precautions Precautions: Fall Restrictions Weight Bearing Restrictions: Yes LUE Weight Bearing: Non weight bearing Other Position/Activity Restrictions: Gentle left elbow motion allowed but no active extension of left elbow against resistance (no pushing up with left arm to get out of a chair or similar)      Mobility Bed Mobility               General bed mobility comments: oob in chair on arrival    Transfers Overall transfer level: Needs assistance Equipment used: 1 person hand held assist Transfers: Sit to/from Stand Sit to Stand: Min assist           General transfer comment: pt requires mod vc and hand over hand to place R hand on chair to help with facilitating good hand placement to push from chair. pt requires (A) to descend to toilet and the chair      Balance Overall balance assessment: Needs assistance Sitting-balance support: No upper extremity supported Sitting balance-Leahy Scale: Good     Standing balance support: Single extremity supported, No upper extremity supported Standing balance-Leahy Scale: Fair Standing balance comment: requies (A) with turns . visual deficits affecting balance                           ADL either performed or assessed with clinical judgement   ADL Overall ADL's : Needs assistance/impaired Eating/Feeding: Modified independent;Sitting       Upper Body Bathing: Moderate assistance   Lower Body Bathing: Maximal assistance   Upper Body Dressing : Moderate assistance Upper Body Dressing Details (indicate cue type and reason): slign education     Toilet Transfer: Moderate  assistance;Ambulation;Regular Toilet;Grab bars           Functional mobility during ADLs: Moderate assistance General ADL Comments: pt provided the IV pole and holding R UE on pole pt was able to progress with transfer.     Vision Baseline Vision/History: 1  Wears glasses Ability to See in Adequate Light: 2 Moderately impaired Patient Visual Report: Other (comment) (hx of visual field deficits) Additional Comments: wear glasses, needs visual cues during transfers     Perception     Praxis      Pertinent Vitals/Pain Pain Assessment Pain Assessment: No/denies pain     Hand Dominance Right   Extremity/Trunk Assessment Upper Extremity Assessment Upper Extremity Assessment: LUE deficits/detail LUE Deficits / Details: AROM digits, decreased wrist flexion/ extension ( nerve block noted) pt with increased activation with visual attention. pt with scapula elevation and depression demonstrated ( shrugging) LUE Sensation: decreased light touch LUE Coordination: decreased fine motor;decreased gross motor   Lower Extremity Assessment Lower Extremity Assessment: Defer to PT evaluation   Cervical / Trunk Assessment Cervical / Trunk Assessment: Normal   Communication Communication Communication: No difficulties   Cognition Arousal/Alertness: Awake/alert Behavior During Therapy: WFL for tasks assessed/performed Overall Cognitive Status: History of cognitive impairments - at baseline (pt has dementia)                                 General Comments: Pt A and O x 4. Pt has h/o dementia with memory deficits pt able to correctly report recent surgery. pt with increased cogntiive deficits with mobility and needs to stop to try to answer questions     General Comments  edema noted L UE and needs continued edema management    Exercises Exercises: Other exercises Other Exercises Other Exercises: AROM digits, wrist and shoulder this session   Shoulder Instructions      Home Living Family/patient expects to be discharged to:: Private residence Living Arrangements: Spouse/significant other Available Help at Discharge: Family;Available 24 hours/day (between husband, son, and step daughter) Type of Home: House Home Access: Ramped  entrance     Home Layout: One level     Bathroom Shower/Tub: Walk-in shower   Bathroom Toilet: Handicapped height Bathroom Accessibility: Yes   Home Equipment: Grab bars - toilet;Grab bars - tub/shower;Cane - single point          Prior Functioning/Environment Prior Level of Function : Independent/Modified Independent;History of Falls (last six months) (pt does not drive)             Mobility Comments: Does not use AD. ADLs Comments: Pt independent with basic ADL's including bathing, dressing, feeding. Pt's husband cooks and takes care of pt's errands. Pt's husband helps manage her medications and finances.        OT Problem List: Decreased activity tolerance;Impaired balance (sitting and/or standing);Decreased cognition;Decreased safety awareness;Decreased knowledge of use of DME or AE;Decreased knowledge of precautions;Cardiopulmonary status limiting activity;Impaired UE functional use;Decreased range of motion      OT Treatment/Interventions: Self-care/ADL training;Therapeutic exercise;Neuromuscular education;Energy conservation;DME and/or AE instruction;Manual therapy;Modalities;Therapeutic activities;Cognitive remediation/compensation;Patient/family education;Balance training    OT Goals(Current goals can be found in the care plan section) Acute Rehab OT Goals Patient Stated Goal: to go to the bathroom... need met during session OT Goal Formulation: With patient Time For Goal Achievement: 08/15/21 Potential to Achieve Goals: Good  OT Frequency: Min 2X/week    Co-evaluation                AM-PAC OT "6 Clicks" Daily Activity     Outcome Measure Help from another person eating meals?: A Little Help from another person taking care of personal grooming?: A Little Help from another person toileting, which includes using toliet, bedpan, or urinal?: A Little Help from another person bathing (including washing, rinsing, drying)?: A Little Help from another person to  put on and taking off regular upper body clothing?: A Little Help from another person to put on and taking off regular lower body clothing?: A Little 6 Click Score: 18   End of Session Equipment Utilized During Treatment: Gait belt Nurse Communication: Mobility status;Precautions;Weight bearing status  Activity Tolerance: Patient tolerated treatment well Patient left: in chair;in CPM;with chair alarm set  OT Visit Diagnosis: Unsteadiness on feet (R26.81);Muscle weakness (generalized) (M62.81)                Time: 1418-1432 OT Time Calculation (min): 14 min Charges:  OT General Charges $OT Visit: 1 Visit OT Evaluation $OT Eval Moderate Complexity: 1 Mod   Brynn, OTR/L  Acute Rehabilitation Services Office: [phone] .   Brynn  08/01/2021, 4:12 PM 

## 2021-08-01 NOTE — Progress Notes (Signed)
Orthopaedic Trauma Service Progress Note  Patient ID: Jacqueline Bonilla MRN: [id number] DOB/AGE: [birth year] 66 y.o.  Subjective:  Doing ok  Pain tolerable to L elbow C/o left knee pain but able to move L knee w/o difficulty    ROS As above  Objective:   VITALS:   Vitals:   08/01/21 0048 08/01/21 0538 08/01/21 0758 08/01/21 0802  BP: 130/78 108/64 (!) 73/59 98/63  Pulse: 95 86  73  Resp: 17   17  Temp:  98.7 F (37.1 C)  98.6 F (37 C)  TempSrc:  Oral  Oral  SpO2: 98% 100%  97%  Weight:      Height:        Estimated body mass index is 20.6 kg/m as calculated from the following:   Height as of this encounter: '5\' 4"'$  (1.626 m).   Weight as of this encounter: 54.4 kg.   Intake/Output      07/31 0701 08/01 0700 08/01 0701 08/02 0700   P.O. 240 240   I.V. (mL/kg) 1100 (20.2)    IV Piggyback 200    Total Intake(mL/kg) 1540 (28.3) 240 (4.4)   Blood 150    Total Output 150    Net +1390 +240        Urine Occurrence 4 x      LABS  Results for orders placed or performed during the hospital encounter of 07/29/21 (from the past 24 hour(s))  CBC     Status: Abnormal   Collection Time: 08/01/21  4:56 AM  Result Value Ref Range   WBC 8.2 4.0 - 10.5 K/uL   RBC 2.75 (L) 3.87 - 5.11 MIL/uL   Hemoglobin 8.3 (L) 12.0 - 15.0 g/dL   HCT 25.7 (L) 36.0 - 46.0 %   MCV 93.5 80.0 - 100.0 fL   MCH 30.2 26.0 - 34.0 pg   MCHC 32.3 30.0 - 36.0 g/dL   RDW 14.0 11.5 - 15.5 %   Platelets 181 150 - 400 K/uL   nRBC 0.0 0.0 - 0.2 %  Basic metabolic panel     Status: Abnormal   Collection Time: 08/01/21  4:56 AM  Result Value Ref Range   Sodium 134 (L) 135 - 145 mmol/L   Potassium 4.2 3.5 - 5.1 mmol/L   Chloride 101 98 - 111 mmol/L   CO2 27 22 - 32 mmol/L   Glucose, Bld 223 (H) 70 - 99 mg/dL   BUN 13 8 - 23 mg/dL   Creatinine, Ser 1.05 (H) 0.44 - 1.00 mg/dL   Calcium 8.3 (L) 8.9 - 10.3 mg/dL   GFR,  Estimated 58 (L) >60 mL/min   Anion gap 6 5 - 15  Glucose, capillary     Status: Abnormal   Collection Time: 08/01/21  7:59 AM  Result Value Ref Range   Glucose-Capillary 114 (H) 70 - 99 mg/dL     PHYSICAL EXAM:   Gen: sitting up in bed, NAD, daughter at bedside  Lungs: unlabored Ext:       Left Upper Extremity   Sling in place  Soft dressing clean, dry and intact  Ext warm   Mild swelling to digits  Radial, ulnar, median nv motor and sensory functions intact  AIN and PIN motor intact  + radial pulse    Assessment/Plan: 1 Day  Post-Op   Principal Problem:   Elbow fracture, left, closed, initial encounter Active Problems:   Essential hypertension   Vascular dementia without behavioral disturbance (HCC)   Normocytic anemia   History of stroke   Anti-infectives (From admission, onward)    Start     Dose/Rate Route Frequency Ordered Stop   07/31/21 2000  ceFAZolin (ANCEF) IVPB 2g/100 mL premix        2 g 200 mL/hr over 30 Minutes Intravenous Every 8 hours 07/31/21 1919 08/01/21 1959   07/31/21 1602  vancomycin (VANCOCIN) powder  Status:  Discontinued          As needed 07/31/21 1603 07/31/21 1745   07/31/21 1030  ceFAZolin (ANCEF) IVPB 2g/100 mL premix        2 g 200 mL/hr over 30 Minutes Intravenous To Surgery 07/31/21 0939 07/31/21 1357   07/29/21 2100  ceFAZolin (ANCEF) IVPB 2g/100 mL premix        2 g 200 mL/hr over 30 Minutes Intravenous Every 8 hours 07/29/21 1835     07/29/21 1230  ceFAZolin (ANCEF) IVPB 2g/100 mL premix        2 g 200 mL/hr over 30 Minutes Intravenous STAT 07/29/21 1217 07/29/21 1410     .  POD/HD#: 16  66 year old right-hand-dominant female ground-level fall with left distal humerus fracture  -Ground-level fall  -Open fragility fracture left distal humerus s/p ORIF  Nonweightbearing left upper extremity  Gentle left elbow motion allowed but no active extension of left elbow against resistance (no pushing up with left arm to get out  of a chair or similar)  Dressing changes as needed starting tomorrow  Ice and elevate  Unrestricted motion of digits, wrist and shoulder  PT and OT evaluations  - Pain management:  Multimodal  Minimize narcotics  - ABL anemia/Hemodynamics  CBC in the morning   Okay to resume antiplatelet therapy  - Medical issues   Per primary  - DVT/PE prophylaxis:  Okay to resume antiplatelet therapy  - ID:   Antibiotics for open fracture x 24 hours  - Metabolic Bone Disease:  Vitamin D levels look good  Recommend DEXA scan as outpatient  - Impediments to fracture healing:  Open fracture  - Dispo:  Ortho issues addressed   Follow-up with orthopedics in 10 to 14 days     Jari Pigg, PA-C [phone] (C) 08/01/2021, 11:16 AM  [facility] [place] [phone] Jenetta [phone] (F)    After 5pm and on the weekends please log on to Amion, go to orthopaedics and the look under the [facility] Call for the provider(s) on call. You can also call our office at [phone] and then follow the prompts to be connected to the call team.   Patient ID: Jacqueline Bonilla, female   DOB: 1955-07-26, 66 y.o.   MRN: [id number]

## 2021-08-01 NOTE — TOC Initial Note (Addendum)
Transition of Care [facility]) - Initial/Assessment Note    Patient Details  Name: Jacqueline Bonilla MRN: [id number] Date of Birth: 1955-10-03  Transition of Care [facility]) CM/SW Contact:    Marilu Favre, RN Phone Number: 08/01/2021, 3:44 PM  Clinical Narrative:                 Spoke to patient at bedside with husband [name] [phone] on speaker phone.  PT recommending HHPT if patient has 24/7 supervision/ assist at home due to high fall risk . If patient does not have 24/7 supervision then PT recommending SNF.   Patient and husband prefers that she goes home at discharge. Per ortho  note start dressing changes tomorrow. NCM explained family will need to learn wound care prior to discharge. Anticipated discharge date is tomorrow. Both voiced understanding. Patient's son Gwynneth Macleod or step daughter Colletta [place] will be providing transportation home at discharge. Eduard Clos states they will be willing to be taught wound care at that time.   [facility] accepted referral for HHRN,PT and aide.   No DME recommendations   Expected Discharge Plan: [place] Barriers to Discharge: Continued Medical Work up   Patient Goals and CMS Choice Patient states their goals for this hospitalization and ongoing recovery are:: to return to home CMS Medicare.gov Compare Post Acute Care list provided to:: Patient Choice offered to / list presented to : Patient  Expected Discharge Plan and Services Expected Discharge Plan: [place]   Discharge Planning Services: CM Consult Post Acute Care Choice: [place] arrangements for the past 2 months: Single Family Home                 DME Arranged: N/A DME Agency: NA       HH Arranged: RN, PT, Nurse's Aide [place] Agency: [place] Date [facility] Agency Contacted: 08/01/21 Time HH Agency Contacted: [phone] Representative spoke with at [place]: Tommi Rumps  Prior Living Arrangements/Services Living arrangements for the past 2  months: [place] Lives with:: Spouse Patient language and need for interpreter reviewed:: Yes Do you feel safe going back to the place where you live?: Yes      Need for Family Participation in Patient Care: Yes (Comment) Care giver support system in place?: Yes (comment)   Criminal Activity/Legal Involvement Pertinent to Current Situation/Hospitalization: No - Comment as needed  Activities of Daily Living Home Assistive Devices/Equipment: Eyeglasses ADL Screening (condition at time of admission) Patient's cognitive ability adequate to safely complete daily activities?: Yes Is the patient deaf or have difficulty hearing?: No Does the patient have difficulty seeing, even when wearing glasses/contacts?: Yes Does the patient have difficulty concentrating, remembering, or making decisions?: Yes Patient able to express need for assistance with ADLs?: Yes Does the patient have difficulty dressing or bathing?: Yes Independently performs ADLs?: Yes (appropriate for developmental age) Does the patient have difficulty walking or climbing stairs?: Yes Weakness of Legs: Both Weakness of Arms/Hands: Both  Permission Sought/Granted   Permission granted to share information with : Yes, Verbal Permission Granted  Share Information with NAME: husband Inge Waldroup [phone]  Permission granted to share info w AGENCY: [facility]        Emotional Assessment Appearance:: Appears stated age Attitude/Demeanor/Rapport: Engaged Affect (typically observed): Accepting Orientation: : Oriented to Place, Oriented to  Time, Oriented to Situation, Oriented to Self Alcohol / Substance Use: Not Applicable Psych Involvement: No (comment)  Admission diagnosis:  Elbow  fracture [S42.409A] Fall, initial encounter [W19.XXXA] Open fracture of distal end of left humerus, unspecified fracture morphology, initial encounter [S42.402B] Long term current use of ticagrelor therapy [Z79.899] Patient Active  Problem List   Diagnosis Date Noted   Orthostatic hypotension 08/01/2021   Elbow fracture, left, closed, initial encounter 07/29/2021   Change in bowel habit 01/10/2021   Internal hemorrhoids 01/10/2021   Ingrowing nail, left great toe 01/10/2021   Dyslipidemia 12/29/2020   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   Stroke (cerebrum) ([place]) 12/01/2020   History of stroke 07/14/2020   Normocytic anemia 07/11/2020   CVA (cerebral vascular accident) ([place]) 06/28/2020   Cerebral infarction ([place]) 06/25/2020   Vascular dementia with behavior disturbance ([place]) 10/13/2019   Vascular dementia without behavioral disturbance ([place]) 07/15/2019   Cerebrovascular accident ([place]) 06/25/2019   History of COVID-19 04/22/2019   Essential hypertension 03/03/2019   Hypertensive urgency 03/03/2019   PCP:  Horald Pollen, MD Pharmacy:   [facility] [place] - [address] AT [facility] [address] [name] [address] Phone: [phone] Fax: [phone]     Social Determinants of Health (SDOH) Interventions    Readmission Risk Interventions     No data to display

## 2021-08-01 NOTE — Anesthesia Postprocedure Evaluation (Signed)
Anesthesia Post Note  Patient: Jacqueline Bonilla  Procedure(s) Performed: OPEN REDUCTION INTERNAL FIXATION (ORIF) DISTAL HUMERUS FRACTURE (Left)     Patient location during evaluation: PACU Anesthesia Type: Regional and MAC Level of consciousness: awake and alert Pain management: pain level controlled Vital Signs Assessment: post-procedure vital signs reviewed and stable Respiratory status: spontaneous breathing, nonlabored ventilation, respiratory function stable and patient connected to nasal cannula oxygen Cardiovascular status: stable and blood pressure returned to baseline Postop Assessment: no apparent nausea or vomiting Anesthetic complications: yes   Encounter Notable Events  Notable Event Outcome Phase Comment  Difficult to intubate - expected  Intraprocedure Filed from anesthesia note documentation.    Last Vitals:  Vitals:   08/01/21 1315 08/01/21 1317  BP: (!) 94/52 (!) 110/58  Pulse: (!) 102 88  Resp: 18 18  Temp:    SpO2: 100% 100%    Last Pain:  Vitals:   08/01/21 0802  TempSrc: Oral  PainSc:                  [name] [name]

## 2021-08-02 DIAGNOSIS — S42402A Unspecified fracture of lower end of left humerus, initial encounter for closed fracture: Secondary | ICD-10-CM | POA: Diagnosis not present

## 2021-08-02 LAB — CBC
HCT: 23.3 % — ABNORMAL LOW (ref 36.0–46.0)
Hemoglobin: 7.8 g/dL — ABNORMAL LOW (ref 12.0–15.0)
MCH: 30.7 pg (ref 26.0–34.0)
MCHC: 33.5 g/dL (ref 30.0–36.0)
MCV: 91.7 fL (ref 80.0–100.0)
Platelets: 189 10*3/uL (ref 150–400)
RBC: 2.54 MIL/uL — ABNORMAL LOW (ref 3.87–5.11)
RDW: 14 % (ref 11.5–15.5)
WBC: 6.3 10*3/uL (ref 4.0–10.5)
nRBC: 0 % (ref 0.0–0.2)

## 2021-08-02 LAB — BASIC METABOLIC PANEL
Anion gap: 5 (ref 5–15)
BUN: 14 mg/dL (ref 8–23)
CO2: 26 mmol/L (ref 22–32)
Calcium: 8.2 mg/dL — ABNORMAL LOW (ref 8.9–10.3)
Chloride: 107 mmol/L (ref 98–111)
Creatinine, Ser: 0.9 mg/dL (ref 0.44–1.00)
GFR, Estimated: >60 mL/min (ref >60)
Glucose, Bld: 109 mg/dL — ABNORMAL HIGH (ref 70–99)
Potassium: 3.7 mmol/L (ref 3.5–5.1)
Sodium: 138 mmol/L (ref 135–145)

## 2021-08-02 NOTE — Progress Notes (Signed)
Physical Therapy Treatment Patient Details Name: Jacqueline Bonilla MRN: [id number] DOB: 1955/10/15 Today's Date: 08/02/2021   History of Present Illness Pt is a 66 y.o. 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 Patient tripped on a piece of stone this morning while taking a walk around her house, denies any new weakness of any other arm or legs, no loss of consciousness no head injuries.  She did hit her left elbow on the ground and excruciating pain afterwards. Also developed a large hematoma around left elbow and severe pain tenderness. Pt now is s/p ORIF left distal humeurs fracture, ulnar osteotomy, and I & D.    PT Comments    Pt agreeable to treatment. Pt with family member who states pt has 3-4 steps to enter house with right rail (no ramp). Pt instructed in and performed gait and stair training. Pt able to complete with contact guard to minimal assistance.  Recommendations for follow up therapy are one component of a multi-disciplinary discharge planning process, led by the attending physician.  Recommendations may be updated based on patient status, additional functional criteria and insurance authorization.  Follow Up Recommendations  Home health PT (if pt has 24/7 supervision; otherwise SNF)     Assistance Recommended at Discharge Frequent or constant Supervision/Assistance  Patient can return home with the following A little help with walking and/or transfers;A little help with bathing/dressing/bathroom;Assistance with cooking/housework;Direct supervision/assist for medications management;Direct supervision/assist for financial management;Assist for transportation;Help with stairs or ramp for entrance   Equipment Recommendations  None recommended by PT    Recommendations for  Other Services       Precautions / Restrictions Precautions Precautions: Fall Restrictions Weight Bearing Restrictions: Yes LUE Weight Bearing: Non weight bearing Other Position/Activity Restrictions: Gentle left elbow motion allowed but no active extension of left elbow against resistance (no pushing up with left arm to get out of a chair or similar)     Mobility  Bed Mobility               General bed mobility comments: oob in chair on arrival    Transfers Overall transfer level: Needs assistance Equipment used: 1 person hand held assist Transfers: Sit to/from Stand Sit to Stand: Min assist                Ambulation/Gait Ambulation/Gait assistance: Min guard, Min assist Gait Distance (Feet): 250 Feet Assistive device: 1 person hand held assist, None Gait Pattern/deviations: Decreased step length - right, Decreased step length - left, Step-through pattern Gait velocity: decreased Gait velocity interpretation: 1.31 - 2.62 ft/sec, indicative of limited community ambulator   General Gait Details: Pt ambulates with short, shuffling steps. Pt with unsteadiness but no overt LOB occurred. Pt performed gait mostly with HHA but occasionally without.   Stairs Stairs: Yes Stairs assistance: Min guard, Min assist Stair Management: Step to pattern, Forwards, One rail Right Number of Stairs: 3 General stair comments: Pt ascended/descended 3 stairs. Minimal to moderate unsteadiness present upon last descent requiring min assist. However, no overt LOB.   Wheelchair Mobility    Modified Rankin (Stroke Patients Only)       Balance Overall balance assessment: Needs assistance Sitting-balance support: No upper extremity supported Sitting balance-Leahy Scale: Good     Standing balance support: Single extremity supported, No upper extremity supported Standing  balance-Leahy Scale: Fair                              Cognition Arousal/Alertness:  Awake/alert Behavior During Therapy: WFL for tasks assessed/performed Overall Cognitive Status: History of cognitive impairments - at baseline (pt has dementia)                                 General Comments: Pt has h/o dementia with memory deficits evident.        Exercises      General Comments        Pertinent Vitals/Pain Pain Assessment Pain Assessment: No/denies pain Pain Location: left shoulder Pain Intervention(s): Monitored during session    Home Living                          Prior Function            PT Goals (current goals can now be found in the care plan section) Acute Rehab PT Goals Patient Stated Goal: none stated PT Goal Formulation: With patient Time For Goal Achievement: 08/15/21 Potential to Achieve Goals:  (fairly good) Progress towards PT goals: Progressing toward goals    Frequency    Min 3X/week      PT Plan Current plan remains appropriate    Co-evaluation              AM-PAC PT "6 Clicks" Mobility   Outcome Measure  Help needed turning from your back to your side while in a flat bed without using bedrails?: A Little Help needed moving from lying on your back to sitting on the side of a flat bed without using bedrails?: A Little Help needed moving to and from a bed to a chair (including a wheelchair)?: A Little Help needed standing up from a chair using your arms (e.g., wheelchair or bedside chair)?: A Little Help needed to walk in hospital room?: A Little Help needed climbing 3-5 steps with a railing? : A Little 6 Click Score: 18    End of Session Equipment Utilized During Treatment: Gait belt Activity Tolerance: Patient tolerated treatment well Patient left: in chair;with call bell/phone within reach;with chair alarm set;with family/visitor present Nurse Communication: Mobility status;Weight bearing status;Precautions PT Visit Diagnosis: Unsteadiness on feet (R26.81);Other abnormalities of gait  and mobility (R26.89);History of falling (Z91.81);Muscle weakness (generalized) (M62.81)     Time: 7564-3329 PT Time Calculation (min) (ACUTE ONLY): 20 min  Charges:  $Therapeutic Activity: 8-22 mins                     Donna Bernard, PT    [facility] 08/02/2021, 11:48 AM

## 2021-08-02 NOTE — Progress Notes (Signed)
Orthopaedic Trauma Service Progress Note  Patient ID: KINAYA HILLIKER MRN: [id number] DOB/AGE: 1955/05/26 66 y.o.  Subjective:  Doing okay no major issues of note.  Feels that she might be ready to go home today.  ROS As above  Objective:   VITALS:   Vitals:   08/01/21 1317 08/01/21 2013 08/02/21 0541 08/02/21 0803  BP: (!) 110/58 123/70 (!) 142/88 125/70  Pulse: 88 100 (!) 109 (!) 106  Resp: '18 18 18 18  '$ Temp:  98.3 F (36.8 C) 98.5 F (36.9 C) 98.6 F (37 C)  TempSrc:  Oral Oral Oral  SpO2: 100% 100% 99% 99%  Weight:      Height:        Estimated body mass index is 20.6 kg/m as calculated from the following:   Height as of this encounter: '5\' 4"'$  (1.626 m).   Weight as of this encounter: 54.4 kg.   Intake/Output      08/01 0701 08/02 0700 08/02 0701 08/03 0700   P.O. 460    I.V. (mL/kg)     IV Piggyback     Total Intake(mL/kg) 460 (8.5)    Urine (mL/kg/hr) 1500 (1.1)    Blood     Total Output 1500    Net -1040         Urine Occurrence 4 x      LABS  Results for orders placed or performed during the hospital encounter of 07/29/21 (from the past 24 hour(s))  Hemoglobin and hematocrit, blood     Status: Abnormal   Collection Time: 08/01/21  5:07 PM  Result Value Ref Range   Hemoglobin 8.1 (L) 12.0 - 15.0 g/dL   HCT 24.3 (L) 36.0 - 46.0 %  CBC     Status: Abnormal   Collection Time: 08/02/21  3:54 AM  Result Value Ref Range   WBC 6.3 4.0 - 10.5 K/uL   RBC 2.54 (L) 3.87 - 5.11 MIL/uL   Hemoglobin 7.8 (L) 12.0 - 15.0 g/dL   HCT 23.3 (L) 36.0 - 46.0 %   MCV 91.7 80.0 - 100.0 fL   MCH 30.7 26.0 - 34.0 pg   MCHC 33.5 30.0 - 36.0 g/dL   RDW 14.0 11.5 - 15.5 %   Platelets 189 150 - 400 K/uL   nRBC 0.0 0.0 - 0.2 %  Basic metabolic panel     Status: Abnormal   Collection Time: 08/02/21  3:54 AM  Result Value Ref Range   Sodium 138 135 - 145 mmol/L   Potassium 3.7 3.5 - 5.1  mmol/L   Chloride 107 98 - 111 mmol/L   CO2 26 22 - 32 mmol/L   Glucose, Bld 109 (H) 70 - 99 mg/dL   BUN 14 8 - 23 mg/dL   Creatinine, Ser 0.90 0.44 - 1.00 mg/dL   Calcium 8.2 (L) 8.9 - 10.3 mg/dL   GFR, Estimated >60 >60 mL/min   Anion gap 5 5 - 15     PHYSICAL EXAM:   Gen: sitting up in bed, NAD, daughter at bedside  Lungs: unlabored Ext:       Left Upper Extremity   Sling in place  Dressing removed and incision has some issues to it but is otherwise without erythema.  Ext warm   Mild swelling to digits  Radial, ulnar,  median nv motor and sensory functions intact  AIN and PIN motor intact  + radial pulse    Assessment/Plan: 2 Days Post-Op   Principal Problem:   Elbow fracture, left, closed, initial encounter Active Problems:   Essential hypertension   Vascular dementia without behavioral disturbance (HCC)   Normocytic anemia   History of stroke   Orthostatic hypotension   Anti-infectives (From admission, onward)    Start     Dose/Rate Route Frequency Ordered Stop   07/31/21 2000  ceFAZolin (ANCEF) IVPB 2g/100 mL premix        2 g 200 mL/hr over 30 Minutes Intravenous Every 8 hours 07/31/21 1919 08/01/21 1313   07/31/21 1602  vancomycin (VANCOCIN) powder  Status:  Discontinued          As needed 07/31/21 1603 07/31/21 1745   07/31/21 1030  ceFAZolin (ANCEF) IVPB 2g/100 mL premix        2 g 200 mL/hr over 30 Minutes Intravenous To Surgery 07/31/21 0939 07/31/21 1357   07/29/21 2100  ceFAZolin (ANCEF) IVPB 2g/100 mL premix        2 g 200 mL/hr over 30 Minutes Intravenous Every 8 hours 07/29/21 1835     07/29/21 1230  ceFAZolin (ANCEF) IVPB 2g/100 mL premix        2 g 200 mL/hr over 30 Minutes Intravenous STAT 07/29/21 1217 07/29/21 1410     .  POD/HD#: 78  66 year old right-hand-dominant female ground-level fall with left distal humerus fracture  -Ground-level fall  -Open fragility fracture left distal humerus s/p ORIF  Nonweightbearing left upper  extremity  Gentle left elbow motion allowed but no active extension of left elbow against resistance (no pushing up with left arm to get out of a chair or similar)  Dressing changes as needed starting tomorrow  Ice and elevate  Unrestricted motion of digits, wrist and shoulder  PT and OT evaluations  - Pain management:  Multimodal  Minimize narcotics  - ABL anemia/Hemodynamics  CBC in the morning   Okay to resume antiplatelet therapy  - Medical issues   Per primary  - DVT/PE prophylaxis:  Okay to resume antiplatelet therapy  - ID:   Antibiotics for open fracture x 24 hours  - Metabolic Bone Disease:  Vitamin D levels look good  Recommend DEXA scan as outpatient  - Impediments to fracture healing:  Open fracture  - Dispo:  Ortho issues addressed-okay for discharge today   Follow-up with orthopedics in 10 to 14 days     Shona Needles, MD [facility] [phone] (office) [email or web address]   [facility] [place] [address] [phone] Jenetta [phone] (F)    After 5pm and on the weekends please log on to Amion, go to orthopaedics and the look under the [facility] Call for the provider(s) on call. You can also call our office at [phone] and then follow the prompts to be connected to the call team.   Patient ID: [name], female   DOB: 01-12-1955, 66 y.o.   MRN: [id number]

## 2021-08-02 NOTE — Progress Notes (Signed)
Discharge instructions reviewed with pt and family member, by RN Ashli  Copy of instructions given to pt, and informed of scripts at her pharmacy.  Pt d/c'd via wheelchair with belongings, with family member.         Escorted by staff.

## 2021-08-02 NOTE — TOC Transition Note (Signed)
Transition of Care [facility]) - CM/SW Discharge Note   Patient Details  Name: Jacqueline Bonilla MRN: [id number] Date of Birth: 1955-10-15  [facility] CM/SW Contact:  Carles Collet, RN Phone Number: 08/02/2021, 11:52 AM   Clinical Narrative:    Plans to [place]. [place] notified of DC. No other TOC needs identified    Final next level of care: [place] Barriers to Discharge: No Barriers Identified   Patient Goals and CMS Choice Patient states their goals for this hospitalization and ongoing recovery are:: to return to home CMS Medicare.gov Compare Post Acute Care list provided to:: Patient Choice offered to / list presented to : Patient  Discharge Placement                       Discharge Plan and Services   Discharge Planning Services: CM Consult Post Acute Care Choice: Home Health          DME Arranged: N/A DME Agency: NA       HH Arranged: RN, PT, Nurse's Aide Cut Off Agency: [place] Date [place]: 08/02/21 Time [place]: 1152 Representative spoke with at [place]: Tommi Rumps  Social Determinants of Health ([place]) Interventions     Readmission Risk Interventions     No data to display

## 2021-08-02 NOTE — Discharge Summary (Signed)
Physician Discharge Summary  Jacqueline Bonilla HAL:[id number] DOB: April 28, 1955 DOA: 07/29/2021  PCP: Horald Pollen, MD  Admit date: 07/29/2021 Discharge date: 08/02/2021  Admitted From: Home Disposition: Home  Recommendations for Outpatient Follow-up:  Follow up with PCP in 1-2 weeks Follow-up with orthopedics, Dr. 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  CT elbow with comminuted left elbow fracture.  Orthopedics was consulted and hospital service consulted further evaluation and management.  Hospital course:  Elbow fracture, left, closed, initial encounter Patient presenting from home following fall with subsequent left elbow pain.  CT elbow notable for comminuted left elbow fracture.  Orthopedics, Dr. Doreatha Martin was consulted and patient underwent open reduction internal fixation left distal humerus fracture with ulnar osteotomy and irrigation/debridement of left open distal humerus fracture on 07/31/2021.  Patient received IV antibiotics during hospitalization due to open fracture in accordance with orthopedic recommendations.  Patient was seen by therapy with recommendation of home health.  Patient will continue nonweightbearing left upper extremity.  Outpatient follow-up with orthopedics 10-14 days following hospitalization.  History of CVA Continue aspirin, Brilinta, atorvastatin.  Acute postoperative blood  loss anemia Hemoglobin on admission 11.6 and slowly declined to 7.8 at time of discharge.  Recommend repeat CBC 1 week.  Vascular dementia without behavioral disturbance Continue donezepil.  Essential hypertension Continue home losartan  Discharge Diagnoses:  Principal Problem:   Elbow fracture, left, closed, initial encounter Active Problems:   Orthostatic hypotension   Essential hypertension   Vascular dementia without behavioral disturbance (HCC)   Normocytic anemia   History of stroke    Discharge Instructions  Discharge Instructions     Call MD for:  difficulty breathing, headache or visual disturbances   Complete by: As directed    Call MD for:  extreme fatigue   Complete by: As directed    Call MD for:  persistant dizziness or light-headedness   Complete by: As directed    Call MD for:  persistant nausea and vomiting   Complete by: As directed    Call MD for:  redness, tenderness, or signs of infection (pain, swelling, redness, odor or green/yellow discharge around incision site)   Complete by: As directed    Call MD for:  severe uncontrolled pain   Complete by: As directed    Call MD for:  temperature >100.4   Complete by: As directed    Diet - low sodium heart healthy   Complete by: As directed    Increase activity slowly   Complete by: As directed    No wound care   Complete by: As directed       Allergies as of 08/02/2021       Reactions   Avelox [moxifloxacin]    Ciprofloxacin Hives   Floraquin [iodoquinol]    No cipro or others   Iohexol     Code: HIVES, Desc: HIVES S/P IVP MANY YRS AGO   Levaquin [levofloxacin]    Plavix [clopidogrel] Diarrhea   Quinolones    Shellfish Allergy Itching  Itching under the chin. Described by patient but not seen by husband.        Medication List     STOP taking these medications    thiamine 100 MG tablet Commonly known as: VITAMIN B1       TAKE these medications    acetaminophen 500 MG  tablet Commonly known as: TYLENOL Take 2 tablets (1,000 mg total) by mouth 3 (three) times daily.   Amino Acids Complex Tabs Take 1 tablet by mouth daily.   aspirin EC 81 MG tablet Take 81 mg by mouth daily. Swallow whole.   atorvastatin 40 MG tablet Commonly known as: LIPITOR Take 1 tablet (40 mg total) by mouth daily.   Brilinta 90 MG Tabs tablet Generic drug: ticagrelor Take 90 mg by mouth 2 (two) times daily.   CINNAMON PO Take 1 capsule by mouth daily.   CO Q 10 PO Take 1 capsule by mouth daily.   donepezil 10 MG tablet Commonly known as: ARICEPT Take 1 tablet (10 mg total) by mouth at bedtime.   losartan 25 MG tablet Commonly known as: COZAAR Take 0.5 tablets (12.5 mg total) by mouth daily.   MAGNESIUM PO Take 1 tablet by mouth at bedtime.   melatonin 5 MG Tabs Take 1 tablet (5 mg total) by mouth at bedtime as needed.   multivitamin with minerals Tabs tablet Take 1 tablet by mouth daily.   oxyCODONE 5 MG immediate release tablet Commonly known as: Oxy IR/ROXICODONE Take 1 tablet (5 mg total) by mouth every 6 (six) hours as needed for moderate pain.   pantoprazole 40 MG tablet Commonly known as: PROTONIX Take 1 tablet (40 mg total) by mouth daily.   ZINC PO Take 1 tablet by mouth daily.        Follow-up Information     Haddix, Thomasene Lot, MD. Schedule an appointment as soon as possible for a visit in 14 day(s).   Specialty: Orthopedic Surgery Contact information: [place] [address] [place], [facility] Follow up.   Specialty: Home Health Services Contact information: [place] [address] [phone]         Horald Pollen, MD. Schedule an appointment as soon as possible for a visit in 1 week(s).   Specialty: Internal Medicine Contact information: [name] [address] [phone]                Allergies  Allergen Reactions    Avelox [Moxifloxacin]    Ciprofloxacin Hives   Floraquin [Iodoquinol]     No cipro or others   Iohexol      Code: HIVES, Desc: HIVES S/P IVP MANY YRS AGO    Levaquin [Levofloxacin]    Plavix [Clopidogrel] Diarrhea   Quinolones    Shellfish Allergy Itching    Itching under the chin. Described by patient but not seen by husband.    Consultations: Orthopedics, Dr. Doreatha Martin   Procedures/Studies: DG Knee Left Port  Result Date: 08/01/2021 CLINICAL DATA:  Fall, pain EXAM: PORTABLE LEFT KNEE - 1-2 VIEW COMPARISON:  None Available. FINDINGS: No evidence of fracture, dislocation, or joint effusion. No evidence of arthropathy or other focal bone abnormality. Soft tissues are unremarkable. IMPRESSION: No fracture or dislocation of the left knee. Joint spaces are well preserved. Electronically Signed   By: Delanna Ahmadi M.D.   On: 08/01/2021 10:55   DG Elbow 2 Views Left  Result Date: 07/31/2021 CLINICAL DATA:  Status post left elbow surgery, initial encounter EXAM: LEFT ELBOW - 2 VIEW COMPARISON:  Intraoperative films from earlier in the same day. FINDINGS: Fixation side plates are noted along the distal aspect left humerus. Fixation screw is noted traversing the proximal ulna osteotomy. Fracture fragments are in near anatomic alignment. IMPRESSION: Status post ORIF of distal left humeral fracture with screw fixation of ulnar osteotomy. Electronically Signed   By: Inez Catalina M.D.   On: 07/31/2021 19:49   DG Humerus Left  Result Date: 07/31/2021 CLINICAL DATA:  Fracture EXAM: LEFT HUMERUS - 2+ VIEW COMPARISON:  07/29/2021 FINDINGS: Seven low resolution intraoperative spot views of the left humerus. Total fluoroscopy time was 59 seconds, fluoroscopic dose of 0.68 mGy. The images demonstrate a comminuted distal humerus fracture with subsequent multiple surgical plate and screw fixation. Residual displaced fracture fragment along the anterior cortex on lateral view. Placement of fixating screw within  the proximal lumen a. IMPRESSION: Intraoperative fluoroscopic assistance provided during surgical fixation of distal humerus fracture. Electronically Signed   By: Donavan Foil M.D.   On: 07/31/2021 17:21   DG C-Arm 1-60 Min-No Report  Result Date: 07/31/2021 Fluoroscopy was utilized by the requesting physician.  No radiographic interpretation.   DG C-Arm 1-60 Min-No Report  Result Date: 07/31/2021 Fluoroscopy was utilized by the requesting physician.  No radiographic interpretation.   DG C-Arm 1-60 Min-No Report  Result Date: 07/31/2021 Fluoroscopy was utilized by the requesting physician.  No radiographic interpretation.   CT ELBOW LEFT WO CONTRAST  Result Date: 07/29/2021 CLINICAL DATA:  Left elbow fracture EXAM: CT OF THE UPPER LEFT EXTREMITY WITHOUT CONTRAST TECHNIQUE: Multidetector CT imaging of the upper left extremity was performed according to the standard protocol. RADIATION DOSE REDUCTION: This exam was performed according to the departmental dose-optimization program which includes automated exposure control, adjustment of the mA and/or kV according to patient size and/or use of iterative reconstruction technique. COMPARISON:  X-ray 07/29/2021 FINDINGS: Bones/Joint/Cartilage Acute heavily comminuted fracture of the distal humerus. Overall fracture alignment is significantly laterally displaced and mildly angulated. Relatively nondisplaced intra-articular component within the trochlea centrally. Supracondylar and transcondylar components with numerous displaced and angulated fracture fragments. There is a skin wound medially with air present within the soft tissues and within the joint compatible with open fracture and traumatic arthrotomy. Elbow joint alignment is maintained without dislocation. The proximal radius and ulna appear intact without evidence of fracture. Moderate-sized elbow joint hemarthrosis. Ligaments Suboptimally assessed by CT. Muscles and Tendons Suboptimally assessed  but suspected posttraumatic intramuscular hematoma about the elbow with enlarged, heterogeneous appearance of the muscles. Soft tissues Diffuse soft tissue swelling about the elbow with medial skin wound and associated soft tissue air. IMPRESSION: 1. Acute heavily comminuted displaced and angulated intra-articular fracture of the distal humerus, as described above. 2. Moderate-sized elbow joint hemarthrosis. 3. Diffuse soft tissue swelling about the elbow with medial skin wound and associated soft tissue air compatible with open fracture and traumatic arthrotomy. Electronically Signed   By: Davina Poke D.O.   On: 07/29/2021 16:13   DG Humerus Left  Result Date: 07/29/2021 CLINICAL DATA:  Fall with LEFT arm pain. EXAM: LEFT HUMERUS - 2 VIEW COMPARISON:  None Available. FINDINGS: A comminuted displaced intra-articular distal humeral fracture is identified. Limited evaluation secondary to patient inability to position. The proximal radius and ulna or difficult to fully evaluate on this study. IMPRESSION: 1. Comminuted displaced intra-articular distal humeral fracture. 2. Proximal radius and ulna not  fully evaluated on this study. Dedicated elbow study is recommended for further evaluation. Electronically Signed   By: Margarette Canada M.D.   On: 07/29/2021 12:25   CUP PACEART REMOTE DEVICE CHECK  Result Date: 07/23/2021 ILR summary report received. Battery status OK. Normal device function. No new symptom, tachy, brady, or pause episodes. No new AF episodes.  AF burden is 0% of the time.  Monthly summary reports and ROV/PRN Kathy Breach, RN, CCDS, [employer]    Subjective: Patient seen examined at bedside, resting comfortably.  Sitting in bedside chair.  Daughter present.  Ready for discharge home.  No other questions or concerns at this time.  Denies headache, no visual changes, no chest pain, palpitations, no shortness of breath, no abdominal pain, no fever/chills/night sweats, no  nausea/vomiting/diarrhea.  No acute events overnight per nursing staff.  Discharge Exam: Vitals:   08/02/21 0541 08/02/21 0803  BP: (!) 142/88 125/70  Pulse: (!) 109 (!) 106  Resp: 18 18  Temp: 98.5 F (36.9 C) 98.6 F (37 C)  SpO2: 99% 99%   Vitals:   08/01/21 1317 08/01/21 2013 08/02/21 0541 08/02/21 0803  BP: (!) 110/58 123/70 (!) 142/88 125/70  Pulse: 88 100 (!) 109 (!) 106  Resp: '18 18 18 18  '$ Temp:  98.3 F (36.8 C) 98.5 F (36.9 C) 98.6 F (37 C)  TempSrc:  Oral Oral Oral  SpO2: 100% 100% 99% 99%  Weight:      Height:        Physical Exam: GEN: NAD, alert, wd/wn HEENT: NCAT, PERRL, EOMI, sclera clear, MMM PULM: CTAB w/o wheezes/crackles, normal respiratory effort on room air CV: RRR w/o M/G/R GI: abd soft, NTND, NABS, no R/G/M MSK: + edema LUE, otherwise no other concerning peripheral edema, left upper extremity with sling in place, neurovascular intact; muscle strength globally intact 5/5 bilateral upper/lower extremities NEURO: CN II-XII intact, no focal deficits, sensation to light touch intact PSYCH: normal mood/affect Integumentary: Left upper extremity with mild edema, incision sites noted, with surrounding ecchymosis without fluctuance/erythema    The results of significant diagnostics from this hospitalization (including imaging, microbiology, ancillary and laboratory) are listed below for reference.     Microbiology: Recent Results (from the past 240 hour(s))  Surgical pcr screen     Status: None   Collection Time: 07/30/21  6:10 PM   Specimen: Nasal Mucosa; Nasal Swab  Result Value Ref Range Status   MRSA, PCR NEGATIVE NEGATIVE Final   Staphylococcus aureus NEGATIVE NEGATIVE Final    Comment: (NOTE) The Xpert SA Assay (FDA approved for NASAL specimens in patients 76 years of age and older), is one component of a comprehensive surveillance program. It is not intended to diagnose infection nor to guide or monitor treatment. Performed at [facility], [place] [address], [place],  [address]      Labs: BNP (last 3 results) No results for input(s): "BNP" in the last 8760 hours. Basic Metabolic Panel: Recent Labs  Lab 07/29/21 1257 07/30/21 0137 07/31/21 0102 08/01/21 0456 08/02/21 0354  NA 136 134* 137 134* 138  K 4.3 4.0 4.0 4.2 3.7  CL 105 105 106 101 107  CO2 '22 23 24 27 26  '$ GLUCOSE 112* 138* 113* 223* 109*  BUN 25* '19 14 13 14  '$ CREATININE 0.97 0.92 0.84 1.05* 0.90  CALCIUM 8.7* 8.7* 8.9 8.3* 8.2*   Liver Function Tests: No results for input(s): "AST", "ALT", "ALKPHOS", "BILITOT", "PROT", "ALBUMIN" in the last  168 hours. No results for input(s): "LIPASE", "AMYLASE" in the last 168 hours. No results for input(s): "AMMONIA" in the last 168 hours. CBC: Recent Labs  Lab 07/29/21 1257 07/30/21 0137 07/31/21 0102 08/01/21 0456 08/01/21 1707 08/02/21 0354  WBC 11.6* 6.0 6.2 8.2  --  6.3  NEUTROABS 10.0*  --   --   --   --   --   HGB 11.6* 9.8* 9.3* 8.3* 8.1* 7.8*  HCT 35.9* 30.1* 28.8* 25.7* 24.3* 23.3*  MCV 93.7 92.6 93.2 93.5  --  91.7  PLT 209 178 183 181  --  189   Cardiac Enzymes: No results for input(s): "CKTOTAL", "CKMB", "CKMBINDEX", "TROPONINI" in the last 168 hours. BNP: Invalid input(s): "POCBNP" CBG: Recent Labs  Lab 08/01/21 0759  GLUCAP 114*   D-Dimer No results for input(s): "DDIMER" in the last 72 hours. Hgb A1c No results for input(s): "HGBA1C" in the last 72 hours. Lipid Profile No results for input(s): "CHOL", "HDL", "LDLCALC", "TRIG", "CHOLHDL", "LDLDIRECT" in the last 72 hours. Thyroid function studies No results for input(s): "TSH", "T4TOTAL", "T3FREE", "THYROIDAB" in the last 72 hours.  Invalid input(s): "FREET3" Anemia work up No results for input(s): "VITAMINB12", "FOLATE", "FERRITIN", "TIBC", "IRON", "RETICCTPCT" in the last 72 hours. Urinalysis    Component Value Date/Time   APPEARANCEUR Clear 08/08/2017 0915   GLUCOSEU Negative 08/08/2017 0915    BILIRUBINUR negative 07/20/2019 0939   BILIRUBINUR Negative 08/08/2017 0915   KETONESUR negative 07/20/2019 0939   PROTEINUR negative 07/20/2019 0939   PROTEINUR Negative 08/08/2017 0915   UROBILINOGEN 0.2 07/20/2019 0939   NITRITE Negative 07/20/2019 0939   NITRITE Negative 08/08/2017 0915   LEUKOCYTESUR Small (1+) (A) 07/20/2019 0939   LEUKOCYTESUR Negative 08/08/2017 0915   Sepsis Labs Recent Labs  Lab 07/30/21 0137 07/31/21 0102 08/01/21 0456 08/02/21 0354  WBC 6.0 6.2 8.2 6.3   Microbiology Recent Results (from the past 240 hour(s))  Surgical pcr screen     Status: None   Collection Time: 07/30/21  6:10 PM   Specimen: Nasal Mucosa; Nasal Swab  Result Value Ref Range Status   MRSA, PCR NEGATIVE NEGATIVE Final   Staphylococcus aureus NEGATIVE NEGATIVE Final    Comment: (NOTE) The Xpert SA Assay (FDA approved for NASAL specimens in patients 10 years of age and older), is one component of a comprehensive surveillance program. It is not intended to diagnose infection nor to guide or monitor treatment. Performed at [facility], [place] [address] [place], [place] [address]      Time coordinating discharge: Over 30 minutes  SIGNED:   Francesco Provencal J [place] DO  [facility] 08/02/2021, 10:48 AM

## 2021-08-02 NOTE — Progress Notes (Signed)
Orthopedic Tech Progress Note Patient Details:  Jacqueline Bonilla 12-Dec-1955 [id number]  PT called requesting a new arm sling, one the patient had was soiled with blood from wound on arm. PT stated she would apply sling once RN had dressed wound    Ortho Devices Type of Ortho Device: Arm sling Ortho Device/Splint Location: LUE Ortho Device/Splint Interventions: Ordered   Post Interventions Patient Tolerated: Well Instructions Provided: [facility] [place] 08/02/2021, 11:09 AM

## 2021-08-03 ENCOUNTER — Telehealth: Payer: Self-pay | Admitting: Emergency Medicine

## 2021-08-03 ENCOUNTER — Encounter: Payer: Self-pay | Admitting: Emergency Medicine

## 2021-08-03 ENCOUNTER — Telehealth: Payer: Self-pay | Admitting: *Deleted

## 2021-08-03 ENCOUNTER — Other Ambulatory Visit: Payer: Self-pay | Admitting: Emergency Medicine

## 2021-08-03 ENCOUNTER — Encounter (HOSPITAL_COMMUNITY): Payer: Self-pay | Admitting: Student

## 2021-08-03 DIAGNOSIS — H538 Other visual disturbances: Secondary | ICD-10-CM | POA: Diagnosis not present

## 2021-08-03 DIAGNOSIS — I10 Essential (primary) hypertension: Secondary | ICD-10-CM | POA: Diagnosis not present

## 2021-08-03 DIAGNOSIS — I69398 Other sequelae of cerebral infarction: Secondary | ICD-10-CM | POA: Diagnosis not present

## 2021-08-03 DIAGNOSIS — D62 Acute posthemorrhagic anemia: Secondary | ICD-10-CM | POA: Diagnosis not present

## 2021-08-03 DIAGNOSIS — R29898 Other symptoms and signs involving the musculoskeletal system: Secondary | ICD-10-CM | POA: Diagnosis not present

## 2021-08-03 DIAGNOSIS — M21372 Foot drop, left foot: Secondary | ICD-10-CM | POA: Diagnosis not present

## 2021-08-03 DIAGNOSIS — F0150 Vascular dementia without behavioral disturbance: Secondary | ICD-10-CM | POA: Diagnosis not present

## 2021-08-03 DIAGNOSIS — I951 Orthostatic hypotension: Secondary | ICD-10-CM | POA: Diagnosis not present

## 2021-08-03 DIAGNOSIS — S42492D Other displaced fracture of lower end of left humerus, subsequent encounter for fracture with routine healing: Secondary | ICD-10-CM | POA: Diagnosis not present

## 2021-08-03 MED ORDER — ONDANSETRON 4 MG PO TBDP: 4.0000 mg | ORAL_TABLET | Freq: Three times a day (TID) | ORAL | 0 refills | Status: DC | PRN

## 2021-08-03 NOTE — Telephone Encounter (Signed)
Okay for verbal orders as requested? 

## 2021-08-03 NOTE — Telephone Encounter (Signed)
Diane from [facility] is requesting verbal orders for Nursing visits and occupational therapy. Frequency: once a week for one week, twice a week for two weeks, and once a week for 6 weeks.   Diane is also requested an updated list of pt medications. She stated the pt is taking a lot of medications that overlap each other and she needs to know which ones the pt should actually be taking.    Please advise  CB: [phone]

## 2021-08-03 NOTE — Patient Outreach (Signed)
  Care Coordination [facility] Note Transition Care Management Follow-up Telephone Call Date of discharge and from where: 08/02/21 [facility] How have you been since you were released from the hospital? Per husband/ caregiver [place], on [facility] DPR: "She is doing great and we have no concerns; the home health nurse is still here and she is doing great to answer our questions; we are not having any problems at all" Any questions or concerns? No  Items Reviewed: Did the pt receive and understand the discharge instructions provided? Yes  Medications obtained and verified? Yes  Other?  N/A Any new allergies since your discharge? No  Dietary orders reviewed? Yes Do you have support at home? Yes   Home Care and Equipment/Supplies: Were home health services ordered? yes If so, what is the name of the agency? [facility]  Has the agency set up a time to come to the patient's home? Yes- Home health RN present in home during [facility] call Were any new equipment or medical supplies ordered?  No What is the name of the medical supply agency? N/A Were you able to get the supplies/equipment? not applicable Do you have any questions related to the use of the equipment or supplies? No N/A  Functional Questionnaire: (I = Independent and D = Dependent) ADLs: I husband assisting as needed  Bathing/Dressing- I husband assisting as needed  Meal Prep- I husband assisting as needed  Eating- I  Maintaining continence- I  Transferring/Ambulation- I   Managing Meds- D husband manages all aspects of medication administration for patient  Follow up appointments reviewed:  PCP Hospital f/u appt confirmed? Yes  Scheduled to see Dr. Mitchel Honour on 08/07/21 @ 2:20 [facility] f/u appt confirmed? No  Scheduled to see - on - @ - husband has contacted Dr. Doreatha Martin' office today to schedule- waiting on call back Are transportation arrangements needed? No  If their condition worsens, is the pt aware to call PCP or go to the  Emergency Dept.? Yes Was the patient provided with contact information for the PCP's office or ED? Yes Was to pt encouraged to call back with questions or concerns? Yes  SDOH assessments and interventions completed:   Yes  Care Coordination Interventions Activated:  No   Care Coordination Interventions:   N/A     Encounter Outcome:  Pt. Visit Completed    Oneta Rack, RN, BSN, [place] RN [place] Management [phone]: direct office

## 2021-08-04 NOTE — Telephone Encounter (Signed)
Called Diane at [facility] to ok verbals. Will fax updated med list to Diane after patient visit on Monday Fax# [phone]

## 2021-08-07 ENCOUNTER — Ambulatory Visit (INDEPENDENT_AMBULATORY_CARE_PROVIDER_SITE_OTHER): Payer: Medicare HMO | Admitting: Emergency Medicine

## 2021-08-07 ENCOUNTER — Encounter: Payer: Self-pay | Admitting: Emergency Medicine

## 2021-08-07 VITALS — BP 110/66 | HR 105 | Temp 98.5°F | Ht 64.0 in | Wt 118.5 lb

## 2021-08-07 DIAGNOSIS — I10 Essential (primary) hypertension: Secondary | ICD-10-CM

## 2021-08-07 DIAGNOSIS — D62 Acute posthemorrhagic anemia: Secondary | ICD-10-CM | POA: Insufficient documentation

## 2021-08-07 DIAGNOSIS — Z8673 Personal history of transient ischemic attack (TIA), and cerebral infarction without residual deficits: Secondary | ICD-10-CM | POA: Diagnosis not present

## 2021-08-07 DIAGNOSIS — S42402D Unspecified fracture of lower end of left humerus, subsequent encounter for fracture with routine healing: Secondary | ICD-10-CM

## 2021-08-07 DIAGNOSIS — Z09 Encounter for follow-up examination after completed treatment for conditions other than malignant neoplasm: Secondary | ICD-10-CM | POA: Diagnosis not present

## 2021-08-07 LAB — CBC WITH DIFFERENTIAL/PLATELET
Basophils Absolute: 0.1 10*3/uL (ref 0.0–0.1)
Basophils Relative: 0.7 % (ref 0.0–3.0)
Eosinophils Absolute: 0.1 10*3/uL (ref 0.0–0.7)
Eosinophils Relative: 1.5 % (ref 0.0–5.0)
HCT: 26.1 % — ABNORMAL LOW (ref 36.0–46.0)
Hemoglobin: 8.7 g/dL — ABNORMAL LOW (ref 12.0–15.0)
Lymphocytes Relative: 9.2 % — ABNORMAL LOW (ref 12.0–46.0)
Lymphs Abs: 0.9 10*3/uL (ref 0.7–4.0)
MCHC: 33.5 g/dL (ref 30.0–36.0)
MCV: 91.3 fl (ref 78.0–100.0)
Monocytes Absolute: 0.9 10*3/uL (ref 0.1–1.0)
Monocytes Relative: 8.7 % (ref 3.0–12.0)
Neutro Abs: 8.1 10*3/uL — ABNORMAL HIGH (ref 1.4–7.7)
Neutrophils Relative %: 79.9 % — ABNORMAL HIGH (ref 43.0–77.0)
Platelets: 374 10*3/uL (ref 150.0–400.0)
RBC: 2.86 Mil/uL — ABNORMAL LOW (ref 3.87–5.11)
RDW: 14.2 % (ref 11.5–15.5)
WBC: 10.2 10*3/uL (ref 4.0–10.5)

## 2021-08-07 NOTE — Assessment & Plan Note (Signed)
Continue Brilinta 90 mg twice a day and baby aspirin a day. Secondary prevention measures discussed.

## 2021-08-07 NOTE — Assessment & Plan Note (Signed)
Well-controlled hypertension. BP Readings from Last 3 Encounters:  08/07/21 110/66  08/02/21 125/70  02/22/21 122/68  Continue losartan 25 mg daily

## 2021-08-07 NOTE — Progress Notes (Signed)
Jacqueline Bonilla 66 y.o.   Chief Complaint  Patient presents with   Follow-up    F/u after surgery    HISTORY OF PRESENT ILLNESS: This is a 66 y.o. female here for follow-up recent hospital discharge when she was admitted with a left elbow comminuted fracture after sustaining a fall.  Patient states she tripped and fall.  No syncopal episode. Progressing well without complications after surgery. Hospital discharge summary as follows: Physician Discharge Summary  KYLE STANSELL WEX:[id number] DOB: 02/05/1955 DOA: 07/29/2021   PCP: Horald Pollen, MD   Admit date: 07/29/2021 Discharge date: 08/02/2021   Admitted From: Home Disposition: Home   Recommendations for Outpatient Follow-up:  Follow up with PCP in 1-2 weeks Follow-up with orthopedics, Dr. 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  CT elbow with comminuted left elbow fracture.  Orthopedics was consulted and hospital service consulted further evaluation and management.   Hospital course:   Elbow fracture, left, closed, initial encounter Patient presenting from home following fall with subsequent left elbow pain.  CT elbow notable for comminuted left elbow fracture.  Orthopedics, Dr. Doreatha Martin was consulted and patient underwent open reduction internal fixation left distal humerus fracture with ulnar osteotomy and irrigation/debridement of left  open distal humerus fracture on 07/31/2021.  Patient received IV antibiotics during hospitalization due to open fracture in accordance with orthopedic recommendations.  Patient was seen by therapy with recommendation of home health.  Patient will continue nonweightbearing left upper extremity.  Outpatient follow-up with orthopedics 10-14 days following hospitalization.   History of CVA Continue aspirin, Brilinta, atorvastatin.   Acute postoperative blood loss anemia Hemoglobin on admission 11.6 and slowly declined to 7.8 at time of discharge.  Recommend repeat CBC 1 week.   Vascular dementia without behavioral disturbance Continue donezepil.   Essential hypertension Continue home losartan   Discharge Diagnoses:  Principal Problem:   Elbow fracture, left, closed, initial encounter Active Problems:   Orthostatic hypotension   Essential hypertension   Vascular dementia without behavioral disturbance (HCC)   Normocytic anemia   History of stroke      HPI   Prior to Admission medications   Medication Sig Start Date End Date Taking? Authorizing Provider  Amino Acids Complex TABS Take 1 tablet by mouth daily.   Yes [provider]  aspirin EC 81 MG tablet Take 81 mg by mouth daily. Swallow whole.   Yes [provider]  BRILINTA 90 MG TABS tablet Take 90 mg by mouth 2 (two) times daily. 07/20/21  Yes [provider]  CINNAMON PO Take 1 capsule by mouth daily.   Yes [provider]  Coenzyme Q10 (CO Q 10 PO) Take 1 capsule by mouth daily.   Yes [provider]  losartan (COZAAR) 25 MG tablet Take 0.5 tablets (12.5 mg total) by mouth daily. 04/24/21  Yes Sheena Donegan, Ines Bloomer, MD  Multiple Vitamin (MULTIVITAMIN WITH MINERALS) TABS tablet Take 1 tablet by mouth daily.   Yes [provider]  Multiple Vitamins-Minerals (ZINC PO) Take 1 tablet by mouth daily.   Yes [provider]  oxyCODONE (OXY IR/ROXICODONE) 5 MG immediate  release tablet Take 1 tablet (5 mg total) by mouth every 6 (six) hours as needed for moderate pain. 08/01/21  Yes Ainsley Spinner, PA-C  acetaminophen (TYLENOL) 500 MG tablet Take 2 tablets (1,000 mg total) by mouth 3 (three) times daily. Patient not taking: Reported on 08/07/2021 08/01/21   Ainsley Spinner, PA-C  atorvastatin (LIPITOR) 40 MG tablet Take 1 tablet (40 mg total) by mouth daily. Patient not taking: Reported on 07/29/2021 12/15/20   Angiulli, Lavon Paganini, PA-C  donepezil (ARICEPT) 10 MG tablet Take 1 tablet (10 mg total) by mouth at bedtime. Patient not taking: Reported on 07/29/2021 02/21/21   [name], MD  MAGNESIUM PO Take 1 tablet by mouth at bedtime. Patient not taking: Reported on 08/07/2021    [provider]  melatonin 5 MG TABS Take 1 tablet (5 mg total) by mouth at bedtime as needed. Patient not taking: Reported on 07/29/2021 12/15/20   Angiulli, Lavon Paganini, PA-C  ondansetron (ZOFRAN-ODT) 4 MG disintegrating tablet Take 1 tablet (4 mg total) by mouth every 8 (eight) hours as needed for nausea or vomiting. Patient not taking: Reported on 08/07/2021 08/03/21   Horald Pollen, MD  pantoprazole (PROTONIX) 40 MG tablet Take 1 tablet (40 mg total) by mouth daily. Patient not taking: Reported on 07/29/2021 12/15/20   Cathlyn Parsons, PA-C    Allergies  Allergen Reactions   Avelox [Moxifloxacin]    Ciprofloxacin Hives   Floraquin [Iodoquinol]     No cipro or others   Iohexol      Code: HIVES, Desc: HIVES S/P IVP MANY YRS AGO    Levaquin [Levofloxacin]    Plavix [Clopidogrel] Diarrhea   Quinolones    Shellfish Allergy Itching    Itching under the chin. Described by patient but not seen by husband.    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   Stroke (cerebrum) ([place]) 12/01/2020   History of stroke 07/14/2020   Normocytic anemia 07/11/2020   CVA (cerebral vascular accident) ([place]) 06/28/2020   Cerebral infarction ([place]) 06/25/2020   Vascular dementia with behavior disturbance ([place]) 10/13/2019   Vascular dementia without behavioral disturbance ([place]) 07/15/2019   Cerebrovascular accident ([place]) 06/25/2019   History of COVID-19 04/22/2019   Essential hypertension 03/03/2019   Hypertensive urgency 03/03/2019    Past Medical History:  Diagnosis Date   Anemia    Anxiety    Depression    Dysmenorrhea    Endometriosis    Fibroid    Hypertension    Implantable loop recorder present 2021   Microhematuria    negative workup   Osteoporosis    Tachycardia    Thrombocytopenia ([place])     Past Surgical History:  Procedure Laterality Date   BREAST BIOPSY     BUBBLE STUDY  12/08/2020   Procedure: BUBBLE STUDY;  Surgeon: Josue Hector, MD;  Location: [facility];  Service: Cardiovascular;;   CESAREAN SECTION     hysteroscopic resection     implantable loop recorder implant  10/21/2019   Medtronic Reveal Linq model LNQ 22 (  SN [id number]) implantable loop recorder   IR CT HEAD LTD  12/01/2020   IR PERCUTANEOUS ART THROMBECTOMY/INFUSION INTRACRANIAL INC DIAG ANGIO  12/01/2020   IR US GUIDE VASC ACCESS RIGHT  12/01/2020   ORIF HUMERUS FRACTURE Left 07/31/2021   Procedure: OPEN REDUCTION INTERNAL FIXATION (ORIF) DISTAL HUMERUS FRACTURE;  Surgeon: Shona Needles, MD;  Location: [facility];  Service: Orthopedics;  Laterality: Left;   RADIOLOGY WITH ANESTHESIA N/A 12/01/2020   Procedure: IR WITH ANESTHESIA - CODE STROKE;  Surgeon: Radiologist, Medication, MD;  Location: [facility];  Service: Radiology;  Laterality: N/A;   TEE WITHOUT CARDIOVERSION N/A 12/08/2020   Procedure: TRANSESOPHAGEAL ECHOCARDIOGRAM (TEE);  Surgeon: Josue Hector, MD;  Location: [facility] ENDOSCOPY;   Service: Cardiovascular;  Laterality: N/A;    Social History   Socioeconomic History   Marital status: Married    Spouse name: Psychologist, prison and probation services   Number of children: 1   Years of education: Not on file   Highest education level: Not on file  Occupational History   Occupation: accounting  Tobacco Use   Smoking status: Never   Smokeless tobacco: Never  Vaping Use   Vaping Use: Never used  Substance and Sexual Activity   Alcohol use: Never   Drug use: Never   Sexual activity: Not Currently    Partners: Male    Comment: husband vasectomy  Other Topics Concern   Not on file  Social History Narrative   Lives with husband   Grandchildren - 1   Works - Investment banker, 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%   Gun in home - yes - secured      Right handed   Caffeine: maybe tea every now and then   Social Determinants of Radio broadcast assistant Strain: Low Risk  (08/25/2020)   Overall Financial Resource Strain (CARDIA)    Difficulty of Paying Living Expenses: Not hard at all  Food Insecurity: No Food Insecurity (08/03/2021)   Hunger Vital Sign    Worried About Running Out of Food in the Last Year: Never true    [place] in the Last Year: Never true  Transportation Needs: No Transportation Needs (08/03/2021)   PRAPARE - Hydrologist (Medical): No    Lack of Transportation (Non-Medical): No  Physical Activity: Sufficiently Active (08/25/2020)   Exercise Vital Sign    Days of Exercise per Week: 5 days    Minutes of Exercise per Session: 30 min  Stress: No Stress Concern Present (08/25/2020)   [place]    Feeling of Stress : Not at all  Social Connections: Not on file  Intimate Partner Violence: Not At Risk (08/25/2020)   Humiliation, Afraid, Rape, and Kick questionnaire    Fear of Current or Ex-Partner: No    Emotionally Abused: No    Physically Abused: No    Sexually Abused: No    Family History   Problem Relation Age of Onset   Cancer Father        non hodgkin lymphoma & skin   Heart attack Maternal Grandfather    Dementia Mother    Polymyositis Sister      Review of Systems  Constitutional: Negative.  Negative for chills and fever.  HENT: Negative.  Negative for congestion and sore throat.   Respiratory: Negative.  Negative for cough and shortness of breath.   Cardiovascular: Negative.  Negative for chest pain and palpitations.  Gastrointestinal:  Negative for abdominal pain, nausea and vomiting.  Skin: Negative.  Negative for rash.  Neurological: Negative.  Negative for dizziness and headaches.  All other systems reviewed and are negative.  Today's Vitals   08/07/21 1436  BP: 110/66  Pulse: (!) 105  Temp: 98.5 F (36.9 C)  TempSrc: Oral  SpO2: 95%  Weight: 118 lb 8 oz (53.8 kg)  Height: '5\' 4"'$  (1.626 m)   Body mass index is 20.34 kg/m.   Physical Exam Vitals reviewed.  Constitutional:      Appearance: Normal appearance.  HENT:     Head: Normocephalic.  Eyes:     Extraocular Movements: Extraocular movements intact.     Pupils: Pupils are equal, round, and reactive to light.  Cardiovascular:     Rate and Rhythm: Normal rate and regular rhythm.     Pulses: Normal pulses.     Heart sounds: Normal heart sounds.  Pulmonary:     Effort: Pulmonary effort is normal.     Breath sounds: Normal breath sounds.  Musculoskeletal:     Comments: Left upper extremity: Dressing removed.  Surgical wound healing well without signs of infection.  He received well postop edema and ecchymosis as expected.  Distally is neurovascularly intact.  Skin:    General: Skin is warm and dry.     Capillary Refill: Capillary refill takes less than 2 seconds.  Neurological:     General: No focal deficit present.     Mental Status: She is alert and oriented to person, place, and time.  Psychiatric:        Mood and Affect: Mood normal.        Behavior: Behavior normal.       ASSESSMENT & PLAN: A total of 44 minutes was spent with the patient and counseling/coordination of care regarding preparing for this visit, review of most recent office visit notes, review of most recent hospital discharge summary notes, review of multiple chronic medical problems and their management, review of all medications, review of most recent blood work results, inspection of surgical site and wound dressing change, prognosis, documentation and need for follow-up.  Problem List Items Addressed This Visit       Cardiovascular and Mediastinum   Essential hypertension    Well-controlled hypertension. BP Readings from Last 3 Encounters:  08/07/21 110/66  08/02/21 125/70  02/22/21 122/68  Continue losartan 25 mg daily         Musculoskeletal and Integument   Closed fracture of left elbow    Healing well. Orthopedic follow-up next week.        Other   History of stroke    Continue Brilinta 90 mg twice a day and baby aspirin a day. Secondary prevention measures discussed.      Anemia due to acute blood loss    Asymptomatic.  Stable. CBC done today.      Relevant Orders   CBC with Differential/Platelet   Comprehensive metabolic panel   Other Visit Diagnoses     Hospital discharge follow-up    -  Primary      Patient Instructions  Health Maintenance After Age 22 After age 89, you are at a higher risk for certain long-term diseases and infections as well as injuries from falls. Falls are a major cause of broken bones and head injuries in people who are older than age 16. Getting regular preventive care can help to keep you healthy and well. Preventive care includes getting regular testing and making  lifestyle changes as recommended by your health care provider. Talk with your health care provider about: Which screenings and tests you should have. A screening is a test that checks for a disease when you have no symptoms. A diet and exercise plan that is right  for you. What should I know about screenings and tests to prevent falls? Screening and testing are the best ways to find a health problem early. Early diagnosis and treatment give you the best chance of managing medical conditions that are common after age 25. Certain conditions and lifestyle choices may make you more likely to have a fall. Your health care provider may recommend: Regular vision checks. Poor vision and conditions such as cataracts can make you more likely to have a fall. If you wear glasses, make sure to get your prescription updated if your vision changes. Medicine review. Work with your health care provider to regularly review all of the medicines you are taking, including over-the-counter medicines. Ask your health care provider about any side effects that may make you more likely to have a fall. Tell your health care provider if any medicines that you take make you feel dizzy or sleepy. Strength and balance checks. Your health care provider may recommend certain tests to check your strength and balance while standing, walking, or changing positions. Foot health exam. Foot pain and numbness, as well as not wearing proper footwear, can make you more likely to have a fall. Screenings, including: Osteoporosis screening. Osteoporosis is a condition that causes the bones to get weaker and break more easily. Blood pressure screening. Blood pressure changes and medicines to control blood pressure can make you feel dizzy. Depression screening. You may be more likely to have a fall if you have a fear of falling, feel depressed, or feel unable to do activities that you used to do. Alcohol use screening. Using too much alcohol can affect your balance and may make you more likely to have a fall. Follow these instructions at home: Lifestyle Do not drink alcohol if: Your health care provider tells you not to drink. If you drink alcohol: Limit how much you have to: 0-1 drink a day for  women. 0-2 drinks a day for men. Know how much alcohol is in your drink. In the [place] one drink equals one 12 oz bottle of beer (355 mL), one 5 oz glass of wine (148 mL), or one 1 oz glass of hard liquor (44 mL). Do not use any products that contain nicotine or tobacco. These products include cigarettes, chewing tobacco, and vaping devices, such as e-cigarettes. If you need help quitting, ask your health care provider. Activity  Follow a regular exercise program to stay fit. This will help you maintain your balance. Ask your health care provider what types of exercise are appropriate for you. If you need a cane or walker, use it as recommended by your health care provider. Wear supportive shoes that have nonskid soles. Safety  Remove any tripping hazards, such as rugs, cords, and clutter. Install safety equipment such as grab bars in bathrooms and safety rails on stairs. Keep rooms and walkways well-lit. General instructions Talk with your health care provider about your risks for falling. Tell your health care provider if: You fall. Be sure to tell your health care provider about all falls, even ones that seem minor. You feel dizzy, tiredness (fatigue), or off-balance. Take over-the-counter and prescription medicines only as told by your health care provider. These include supplements. Eat a healthy  diet and maintain a healthy weight. A healthy diet includes low-fat dairy products, low-fat (lean) meats, and fiber from whole grains, beans, and lots of fruits and vegetables. Stay current with your vaccines. Schedule regular health, dental, and eye exams. Summary Having a healthy lifestyle and getting preventive care can help to protect your health and wellness after age 43. Screening and testing are the best way to find a health problem early and help you avoid having a fall. Early diagnosis and treatment give you the best chance for managing medical conditions that are more common for people  who are older than age 30. Falls are a major cause of broken bones and head injuries in people who are older than age 66. Take precautions to prevent a fall at home. Work with your health care provider to learn what changes you can make to improve your health and wellness and to prevent falls. This information is not intended to replace advice given to you by your health care provider. Make sure you discuss any questions you have with your health care provider. Document Revised: 05/09/2020 Document Reviewed: 05/09/2020 Elsevier Patient Education  [place], MD [facility]

## 2021-08-07 NOTE — Patient Instructions (Signed)
Health Maintenance After Age 68 After age 68, you are at a higher risk for certain long-term diseases and infections as well as injuries from falls. Falls are a major cause of broken bones and head injuries in people who are older than age 68. Getting regular preventive care can help to keep you healthy and well. Preventive care includes getting regular testing and making lifestyle changes as recommended by your health care provider. Talk with your health care provider about: Which screenings and tests you should have. A screening is a test that checks for a disease when you have no symptoms. A diet and exercise plan that is right for you. What should I know about screenings and tests to prevent falls? Screening and testing are the best ways to find a health problem early. Early diagnosis and treatment give you the best chance of managing medical conditions that are common after age 68. Certain conditions and lifestyle choices may make you more likely to have a fall. Your health care provider may recommend: Regular vision checks. Poor vision and conditions such as cataracts can make you more likely to have a fall. If you wear glasses, make sure to get your prescription updated if your vision changes. Medicine review. Work with your health care provider to regularly review all of the medicines you are taking, including over-the-counter medicines. Ask your health care provider about any side effects that may make you more likely to have a fall. Tell your health care provider if any medicines that you take make you feel dizzy or sleepy. Strength and balance checks. Your health care provider may recommend certain tests to check your strength and balance while standing, walking, or changing positions. Foot health exam. Foot pain and numbness, as well as not wearing proper footwear, can make you more likely to have a fall. Screenings, including: Osteoporosis screening. Osteoporosis is a condition that causes  the bones to get weaker and break more easily. Blood pressure screening. Blood pressure changes and medicines to control blood pressure can make you feel dizzy. Depression screening. You may be more likely to have a fall if you have a fear of falling, feel depressed, or feel unable to do activities that you used to do. Alcohol use screening. Using too much alcohol can affect your balance and may make you more likely to have a fall. Follow these instructions at home: Lifestyle Do not drink alcohol if: Your health care provider tells you not to drink. If you drink alcohol: Limit how much you have to: 0-1 drink a day for women. 0-2 drinks a day for men. Know how much alcohol is in your drink. In the [place] one drink equals one 12 oz bottle of beer (355 mL), one 5 oz glass of wine (148 mL), or one 1 oz glass of hard liquor (44 mL). Do not use any products that contain nicotine or tobacco. These products include cigarettes, chewing tobacco, and vaping devices, such as e-cigarettes. If you need help quitting, ask your health care provider. Activity  Follow a regular exercise program to stay fit. This will help you maintain your balance. Ask your health care provider what types of exercise are appropriate for you. If you need a cane or walker, use it as recommended by your health care provider. Wear supportive shoes that have nonskid soles. Safety  Remove any tripping hazards, such as rugs, cords, and clutter. Install safety equipment such as grab bars in bathrooms and safety rails on stairs. Keep rooms and walkways   well-lit. General instructions Talk with your health care provider about your risks for falling. Tell your health care provider if: You fall. Be sure to tell your health care provider about all falls, even ones that seem minor. You feel dizzy, tiredness (fatigue), or off-balance. Take over-the-counter and prescription medicines only as told by your health care provider. These include  supplements. Eat a healthy diet and maintain a healthy weight. A healthy diet includes low-fat dairy products, low-fat (lean) meats, and fiber from whole grains, beans, and lots of fruits and vegetables. Stay current with your vaccines. Schedule regular health, dental, and eye exams. Summary Having a healthy lifestyle and getting preventive care can help to protect your health and wellness after age 68. Screening and testing are the best way to find a health problem early and help you avoid having a fall. Early diagnosis and treatment give you the best chance for managing medical conditions that are more common for people who are older than age 68. Falls are a major cause of broken bones and head injuries in people who are older than age 68. Take precautions to prevent a fall at home. Work with your health care provider to learn what changes you can make to improve your health and wellness and to prevent falls. This information is not intended to replace advice given to you by your health care provider. Make sure you discuss any questions you have with your health care provider. Document Revised: 05/09/2020 Document Reviewed: 05/09/2020 Elsevier Patient Education  2023 Elsevier Inc.  

## 2021-08-07 NOTE — Assessment & Plan Note (Signed)
Healing well. Orthopedic follow-up next week.

## 2021-08-07 NOTE — Assessment & Plan Note (Signed)
Asymptomatic.  Stable. CBC done today.

## 2021-08-08 LAB — COMPREHENSIVE METABOLIC PANEL
ALT: 18 U/L (ref 0–35)
AST: 23 U/L (ref 0–37)
Albumin: 3.8 g/dL (ref 3.5–5.2)
Alkaline Phosphatase: 60 U/L (ref 39–117)
BUN: 29 mg/dL — ABNORMAL HIGH (ref 6–23)
CO2: 27 mEq/L (ref 19–32)
Calcium: 9.4 mg/dL (ref 8.4–10.5)
Chloride: 99 mEq/L (ref 96–112)
Creatinine, Ser: 0.92 mg/dL (ref 0.40–1.20)
GFR: 64.12 mL/min (ref >60.00)
Glucose, Bld: 111 mg/dL — ABNORMAL HIGH (ref 70–99)
Potassium: 4.4 mEq/L (ref 3.5–5.1)
Sodium: 136 mEq/L (ref 135–145)
Total Bilirubin: 0.6 mg/dL (ref 0.2–1.2)
Total Protein: 7.3 g/dL (ref 6.0–8.3)

## 2021-08-08 NOTE — Telephone Encounter (Signed)
Called Diane with [facility] with the ok verbals per provider

## 2021-08-08 NOTE — Telephone Encounter (Signed)
Okay for verbal orders.  Thanks

## 2021-08-08 NOTE — Telephone Encounter (Signed)
Updated med list faxed to requested #

## 2021-08-08 NOTE — Telephone Encounter (Signed)
Jacqueline Bonilla is requesting an order for a home health aide to assist with showers and dressing  Frequency:  1 week x 1, 2 weeks x 2, 1 week x 1.

## 2021-08-14 ENCOUNTER — Telehealth: Payer: Self-pay | Admitting: Emergency Medicine

## 2021-08-14 NOTE — Telephone Encounter (Signed)
Angel from [facility] is requesting verbal orders for Home health aid for showers. Starting immediately and it would be twice a week for 3 weeks.  Please advise  CB: [phone]. It is ok to leave a message.

## 2021-08-14 NOTE — Telephone Encounter (Signed)
Please okay any verbal orders requested

## 2021-08-15 DIAGNOSIS — S42492D Other displaced fracture of lower end of left humerus, subsequent encounter for fracture with routine healing: Secondary | ICD-10-CM | POA: Diagnosis not present

## 2021-08-15 NOTE — Telephone Encounter (Signed)
Called Angel at [facility] and ok verbals per provider

## 2021-08-20 ENCOUNTER — Other Ambulatory Visit: Payer: Self-pay | Admitting: Emergency Medicine

## 2021-08-21 ENCOUNTER — Ambulatory Visit: Payer: Medicare HMO | Admitting: Neurology

## 2021-08-23 ENCOUNTER — Ambulatory Visit: Payer: Self-pay | Admitting: Licensed Clinical Social Worker

## 2021-08-23 NOTE — Progress Notes (Signed)
Carelink Summary Report / Loop Recorder 

## 2021-08-23 NOTE — Patient Instructions (Signed)
Visit Information  Thank you for taking time to visit with me today. Please don't hesitate to contact me if I can be of assistance to you before our next scheduled telephone appointment.  Following are the goals we discussed today:   No further intervention is needed at this time  Please call the care guide team at [phone] if you need to cancel or reschedule your appointment.   If you are experiencing a Mental Health or [place] or need someone to talk to, please go to [facility] [employer] [phone])   Following is a copy of your full plan of care:   Care Coordination Interventions:  Active listening / Reflection utilized  Problem Solving [place] strategies reviewed Discussed Care Coordination program support available to client with Josie Dixon, spouse of client Discussed client needs. 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   She is eating adequately. She is at risk for falls. Eduard Clos said she receives physical therapy one time weekly in the home Invited Khalaya Mcgurn to call LCSW as needed at [phone] to further discuss Care Coordination program support for Tracie Dore (as needed)   Ms. Donlan was given information about Care Management services by the embedded care coordination team including:  Care Management services include personalized support from designated clinical staff supervised by her physician, including individualized plan of care and coordination with other care providers 24/7 contact phone numbers for assistance for urgent and routine care needs. The patient may stop CCM services at any time (effective at the end of the month) by phone call to the office staff.  Patient agreed to services and verbal consent obtained.   Norva Riffle.Peggyann Zwiefelhofer MSW, [place] [employer] representative [facility] Care Management [phone]

## 2021-08-23 NOTE — Patient Outreach (Signed)
  Care Coordination   Initial Visit Note   08/23/2021 Name: Jacqueline Bonilla MRN: [id number] DOB: 02-24-1955  Jacqueline Bonilla is a 66 y.o. year old female who sees Sagardia, Ines Bloomer, MD for primary care. I spoke with  [name] / spouse of client, Jacqueline Bonilla ,by phone today  What matters to the patients health and wellness today? Client requires daily care with ADLs. She has vascular dementia    Goals Addressed             This Visit's Progress    Patient reguires daily care for ADLs. She has vascular dementia.       Care Coordination Interventions:  Active listening / Reflection utilized  Problem Solving [place] strategies reviewed Discussed Care Coordination program support available to client with Josie Dixon, spouse of client Discussed client needs. 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   She is eating adequately. She is at risk for falls. Eduard Clos said she receives physical therapy one time weekly in the home Invited Kymoni Lesperance to call LCSW as needed at [phone] to further discuss Care Coordination program support for Polly Barner (as needed)         SDOH assessments and interventions completed:  No   Care Coordination Interventions Activated:  No  Care Coordination Interventions:  No, not indicated   Follow up plan: No further intervention required.   Encounter Outcome:  Pt. Visit Completed

## 2021-08-25 LAB — CUP PACEART REMOTE DEVICE CHECK
Date Time Interrogation Session: 20230817231033
Implantable Pulse Generator Implant Date: 20211020

## 2021-08-28 ENCOUNTER — Ambulatory Visit (INDEPENDENT_AMBULATORY_CARE_PROVIDER_SITE_OTHER): Payer: Medicare HMO

## 2021-08-28 VITALS — Ht 64.0 in | Wt 120.0 lb

## 2021-08-28 DIAGNOSIS — Z0000 Encounter for general adult medical examination without abnormal findings: Secondary | ICD-10-CM

## 2021-08-28 DIAGNOSIS — I63321 Cerebral infarction due to thrombosis of right anterior cerebral artery: Secondary | ICD-10-CM

## 2021-08-28 DIAGNOSIS — Z780 Asymptomatic menopausal state: Secondary | ICD-10-CM | POA: Diagnosis not present

## 2021-08-28 NOTE — Patient Instructions (Signed)
Jacqueline Bonilla , Thank you for taking time to come for your Medicare Wellness Visit. I appreciate your ongoing commitment to your health goals. Please review the following plan we discussed and let me know if I can assist you in the future.   Screening recommendations/referrals: Colonoscopy: Cologuard 11/12/2019  due 11/2022 Mammogram: declined at this time to follow up with PCP  Bone Density: Referral 08/28/2021 Recommended yearly ophthalmology/optometry visit for glaucoma screening and checkup Recommended yearly dental visit for hygiene and checkup  Vaccinations: Influenza vaccine: declined  Pneumococcal vaccine: declined  Tdap vaccine: 07/29/2021 Shingles vaccine: will consider    Covid-19:declined   Advanced directives: yes   Conditions/risks identified: Aim for 30 minutes of exercise or brisk walking, 6-8 glasses of water, and 5 servings of fruits and vegetables each day.   Next appointment: Follow up in one year for your annual wellness visit    Preventive Care 68 Years and Older, Female Preventive care refers to lifestyle choices and visits with your health care provider that can promote health and wellness. What does preventive care include? A yearly physical exam. This is also called an annual well check. Dental exams once or twice a year. Routine eye exams. Ask your health care provider how often you should have your eyes checked. Personal lifestyle choices, including: Daily care of your teeth and gums. Regular physical activity. Eating a healthy diet. Avoiding tobacco and drug use. Limiting alcohol use. Practicing safe sex. Taking low-dose aspirin every day. Taking vitamin and mineral supplements as recommended by your health care provider. What happens during an annual well check? The services and screenings done by your health care provider during your annual well check will depend on your age, overall health, lifestyle risk factors, and family history of  disease. Counseling  Your health care provider may ask you questions about your: Alcohol use. Tobacco use. Drug use. Emotional well-being. Home and relationship well-being. Sexual activity. Eating habits. History of falls. Memory and ability to understand (cognition). Work and work Statistician. Reproductive health. Screening  You may have the following tests or measurements: Height, weight, and BMI. Blood pressure. Lipid and cholesterol levels. These may be checked every 5 years, or more frequently if you are over 68 years old. Skin check. Lung cancer screening. You may have this screening every year starting at age 68 if you have a 30-pack-year history of smoking and currently smoke or have quit within the past 15 years. Fecal occult blood test (FOBT) of the stool. You may have this test every year starting at age 68. Flexible sigmoidoscopy or colonoscopy. You may have a sigmoidoscopy every 5 years or a colonoscopy every 10 years starting at age 68. Hepatitis C blood test. Hepatitis B blood test. Sexually transmitted disease (STD) testing. Diabetes screening. This is done by checking your blood sugar (glucose) after you have not eaten for a while (fasting). You may have this done every 1-3 years. Bone density scan. This is done to screen for osteoporosis. You may have this done starting at age 68. Mammogram. This may be done every 1-2 years. Talk to your health care provider about how often you should have regular mammograms. Talk with your health care provider about your test results, treatment options, and if necessary, the need for more tests. Vaccines  Your health care provider may recommend certain vaccines, such as: Influenza vaccine. This is recommended every year. Tetanus, diphtheria, and acellular pertussis (Tdap, Td) vaccine. You may need a Td booster every 10 years. Zoster vaccine.  You may need this after age 68. Pneumococcal 13-valent conjugate (PCV13) vaccine. One  dose is recommended after age 68. Pneumococcal polysaccharide (PPSV23) vaccine. One dose is recommended after age 68. Talk to your health care provider about which screenings and vaccines you need and how often you need them. This information is not intended to replace advice given to you by your health care provider. Make sure you discuss any questions you have with your health care provider. Document Released: 01/14/2015 Document Revised: 09/07/2015 Document Reviewed: 10/19/2014 Elsevier Interactive Patient Education  2017 [place] Prevention in the Home Falls can cause injuries. They can happen to people of all ages. There are many things you can do to make your home safe and to help prevent falls. What can I do on the outside of my home? Regularly fix the edges of walkways and driveways and fix any cracks. Remove anything that might make you trip as you walk through a door, such as a raised step or threshold. Trim any bushes or trees on the path to your home. Use bright outdoor lighting. Clear any walking paths of anything that might make someone trip, such as rocks or tools. Regularly check to see if handrails are loose or broken. Make sure that both sides of any steps have handrails. Any raised decks and porches should have guardrails on the edges. Have any leaves, snow, or ice cleared regularly. Use sand or salt on walking paths during winter. Clean up any spills in your garage right away. This includes oil or grease spills. What can I do in the bathroom? Use night lights. Install grab bars by the toilet and in the tub and shower. Do not use towel bars as grab bars. Use non-skid mats or decals in the tub or shower. If you need to sit down in the shower, use a plastic, non-slip stool. Keep the floor dry. Clean up any water that spills on the floor as soon as it happens. Remove soap buildup in the tub or shower regularly. Attach bath mats securely with double-sided  non-slip rug tape. Do not have throw rugs and other things on the floor that can make you trip. What can I do in the bedroom? Use night lights. Make sure that you have a light by your bed that is easy to reach. Do not use any sheets or blankets that are too big for your bed. They should not hang down onto the floor. Have a firm chair that has side arms. You can use this for support while you get dressed. Do not have throw rugs and other things on the floor that can make you trip. What can I do in the kitchen? Clean up any spills right away. Avoid walking on wet floors. Keep items that you use a lot in easy-to-reach places. If you need to reach something above you, use a strong step stool that has a grab bar. Keep electrical cords out of the way. Do not use floor polish or wax that makes floors slippery. If you must use wax, use non-skid floor wax. Do not have throw rugs and other things on the floor that can make you trip. What can I do with my stairs? Do not leave any items on the stairs. Make sure that there are handrails on both sides of the stairs and use them. Fix handrails that are broken or loose. Make sure that handrails are as long as the stairways. Check any carpeting to make sure that it is  firmly attached to the stairs. Fix any carpet that is loose or worn. Avoid having throw rugs at the top or bottom of the stairs. If you do have throw rugs, attach them to the floor with carpet tape. Make sure that you have a light switch at the top of the stairs and the bottom of the stairs. If you do not have them, ask someone to add them for you. What else can I do to help prevent falls? Wear shoes that: Do not have high heels. Have rubber bottoms. Are comfortable and fit you well. Are closed at the toe. Do not wear sandals. If you use a stepladder: Make sure that it is fully opened. Do not climb a closed stepladder. Make sure that both sides of the stepladder are locked into place. Ask  someone to hold it for you, if possible. Clearly mark and make sure that you can see: Any grab bars or handrails. First and last steps. Where the edge of each step is. Use tools that help you move around (mobility aids) if they are needed. These include: Canes. Walkers. Scooters. Crutches. Turn on the lights when you go into a dark area. Replace any light bulbs as soon as they burn out. Set up your furniture so you have a clear path. Avoid moving your furniture around. If any of your floors are uneven, fix them. If there are any pets around you, be aware of where they are. Review your medicines with your doctor. Some medicines can make you feel dizzy. This can increase your chance of falling. Ask your doctor what other things that you can do to help prevent falls. This information is not intended to replace advice given to you by your health care provider. Make sure you discuss any questions you have with your health care provider. Document Released: 10/14/2008 Document Revised: 05/26/2015 Document Reviewed: 01/22/2014 Elsevier Interactive Patient Education  2017 [employer].

## 2021-08-28 NOTE — Progress Notes (Signed)
Subjective:   Jacqueline Bonilla is a 66 y.o. female who presents for Medicare Annual (Subsequent) preventive examination.   Virtual Visit via Telephone Note  I connected with  [name] on 08/28/21 at  9:45 AM EDT by telephone and verified that I am speaking with the correct person using two identifiers.  Location: Patient: home  [place]  Persons participating in the virtual visit: [name]   I discussed the limitations, risks, security and privacy concerns of performing an evaluation and management service by telephone and the availability of in person appointments. The patient expressed understanding and agreed to proceed.  Interactive audio and video telecommunications were attempted between this nurse and patient, however failed, due to patient having technical difficulties OR patient did not have access to video capability.  We continued and completed visit with audio only.  Some vital signs may be absent or patient reported.   [name], LPN  Review of Systems     Cardiac Risk Factors include: advanced age (>16mn, >>20women)     Objective:    Today's Vitals   08/28/21 0949  Weight: 120 lb (54.4 kg)  Height: '5\' 4"'$  (1.626 m)   Body mass index is 20.6 kg/m.     08/28/2021   10:06 AM 07/31/2021    1:45 PM 02/10/2021   10:08 AM 12/08/2020    3:08 PM 08/25/2020    1:39 PM 06/28/2020    4:01 PM 06/24/2020   11:29 AM  Advanced Directives  Does Patient Have a Medical Advance Directive? Yes Yes No No Yes No No  Type of ACorporate treasurerof ANew UnionLiving will   Living will;Healthcare Power of Attorney    Does patient want to make changes to medical advance directive?  No - Patient declined       Copy of [place] Chart?  No - copy requested   No - copy requested    Would patient like information on creating a medical advance directive?    No - Patient declined  No - Patient  declined No - Patient declined    Current Medications (verified) Outpatient Encounter Medications as of 08/28/2021  Medication Sig   Amino Acids Complex TABS Take 1 tablet by mouth daily.   aspirin EC 81 MG tablet Take 81 mg by mouth daily. Swallow whole.   BRILINTA 90 MG TABS tablet TAKE 1 TABLET(90 MG) BY MOUTH TWICE DAILY   CINNAMON PO Take 1 capsule by mouth daily.   Coenzyme Q10 (CO Q 10 PO) Take 1 capsule by mouth daily.   losartan (COZAAR) 25 MG tablet Take 0.5 tablets (12.5 mg total) by mouth daily.   MAGNESIUM PO Take 1 tablet by mouth at bedtime.   Multiple Vitamin (MULTIVITAMIN WITH MINERALS) TABS tablet Take 1 tablet by mouth daily.   Multiple Vitamins-Minerals (ZINC PO) Take 1 tablet by mouth daily.   acetaminophen (TYLENOL) 500 MG tablet Take 2 tablets (1,000 mg total) by mouth 3 (three) times daily.   atorvastatin (LIPITOR) 40 MG tablet Take 1 tablet (40 mg total) by mouth daily. (Patient not taking: Reported on 08/28/2021)   donepezil (ARICEPT) 10 MG tablet Take 1 tablet (10 mg total) by mouth at bedtime.   melatonin 5 MG TABS Take 1 tablet (5 mg total) by mouth at bedtime as needed.   ondansetron (ZOFRAN-ODT) 4 MG disintegrating tablet Take 1 tablet (4 mg total) by mouth every 8 (eight) hours  as needed for nausea or vomiting.   oxyCODONE (OXY IR/ROXICODONE) 5 MG immediate release tablet Take 1 tablet (5 mg total) by mouth every 6 (six) hours as needed for moderate pain. (Patient not taking: Reported on 08/28/2021)   pantoprazole (PROTONIX) 40 MG tablet Take 1 tablet (40 mg total) by mouth daily.   No facility-administered encounter medications on file as of 08/28/2021.    Allergies (verified) Avelox [moxifloxacin], Ciprofloxacin, Floraquin [iodoquinol], Iohexol, Levaquin [levofloxacin], Plavix [clopidogrel], Quinolones, and Shellfish allergy   History: Past Medical History:  Diagnosis Date   Anemia    Anxiety    Depression    Dysmenorrhea    Endometriosis    Fibroid     Hypertension    Implantable loop recorder present 2021   Microhematuria    negative workup   Osteoporosis    Tachycardia    Thrombocytopenia ([place])    Past Surgical History:  Procedure Laterality Date   BREAST BIOPSY     BUBBLE STUDY  12/08/2020   Procedure: BUBBLE STUDY;  Surgeon: Josue Hector, MD;  [facility]: [facility] ENDOSCOPY;  Service: Cardiovascular;;   CESAREAN SECTION     hysteroscopic resection     implantable loop recorder implant  10/21/2019   Medtronic Reveal Linq model LNQ 22 ([place] [id number]) implantable loop recorder   IR CT HEAD LTD  12/01/2020   IR PERCUTANEOUS ART THROMBECTOMY/INFUSION INTRACRANIAL INC DIAG ANGIO  12/01/2020   IR US GUIDE VASC ACCESS RIGHT  12/01/2020   ORIF HUMERUS FRACTURE Left 07/31/2021   Procedure: OPEN REDUCTION INTERNAL FIXATION (ORIF) DISTAL HUMERUS FRACTURE;  Surgeon: Shona Needles, MD;  Location: [place];  Service: Orthopedics;  Laterality: Left;   RADIOLOGY WITH ANESTHESIA N/A 12/01/2020   Procedure: IR WITH ANESTHESIA - CODE STROKE;  Surgeon: Radiologist, Medication, MD;  Location: [place];  Service: Radiology;  Laterality: N/A;   TEE WITHOUT CARDIOVERSION N/A 12/08/2020   Procedure: TRANSESOPHAGEAL ECHOCARDIOGRAM (TEE);  Surgeon: Josue Hector, MD;  Location: [facility] ENDOSCOPY;  Service: Cardiovascular;  Laterality: N/A;   Family History  Problem Relation Age of Onset   Cancer Father        non hodgkin lymphoma & skin   Heart attack Maternal Grandfather    Dementia Mother    Polymyositis Sister    Social History   Socioeconomic History   Marital status: Married    Spouse name: Psychologist, prison and probation services   Number of children: 1   Years of education: Not on file   Highest education level: Not on file  Occupational History   Occupation: accounting  Tobacco Use   Smoking status: Never   Smokeless tobacco: Never  Vaping Use   Vaping Use: Never used  Substance and Sexual Activity   Alcohol use: Never   Drug use: Never   Sexual activity: Not Currently     Partners: Male    Comment: husband vasectomy  Other Topics Concern   Not on file  Social History Narrative   Lives with husband   Grandchildren - 1   Works - Investment banker, 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%   Gun in home - yes - secured      Right handed   Caffeine: maybe tea every now and then   Social Determinants of Health   Financial Resource Strain: Low Risk  (08/28/2021)   Overall Financial Resource Strain (CARDIA)    Difficulty of Paying Living Expenses: Not hard at all  Food Insecurity: No Food Insecurity (08/28/2021)   Hunger Vital  Sign    Worried About Charity fundraiser in the Last Year: Never true    [place] in the Last Year: Never true  Transportation Needs: No Transportation Needs (08/28/2021)   PRAPARE - Hydrologist (Medical): No    Lack of Transportation (Non-Medical): No  Physical Activity: Sufficiently Active (08/28/2021)   Exercise Vital Sign    Days of Exercise per Week: 4 days    Minutes of Exercise per Session: 60 min  Stress: No Stress Concern Present (08/28/2021)   [place]    Feeling of Stress : Not at all  Social Connections: Moderately Integrated (08/28/2021)   Social Connection and Isolation Panel [NHANES]    Frequency of Communication with Friends and Family: More than three times a week    Frequency of Social Gatherings with Friends and Family: Never    Attends Religious Services: More than 4 times per year    Active Member of [employer] or Organizations: No    Attends Music therapist: Never    Marital Status: Married    Tobacco Counseling Counseling given: No   Clinical Intake:  Pre-visit preparation completed: Yes  Pain : No/denies pain     Diabetes: No  How often do you need to have someone help you when you read instructions, pamphlets, or other written materials from your doctor or pharmacy?: 1 - Never What is the last grade level  you completed in school?: [place]   Interpreter Needed?: No  Information entered by :: L.Wilson,LPN   Activities of Daily Living    08/28/2021   10:06 AM 07/29/2021    6:25 PM  In your present state of health, do you have any difficulty performing the following activities:  Hearing? 0   Vision? 0   Difficulty concentrating or making decisions? 1   Comment Dx: Vascular dementia   Walking or climbing stairs? 0   Dressing or bathing? 1   Doing errands, shopping? 1 1  Preparing Food and eating ? N   Using the Toilet? N   In the past six months, have you accidently leaked urine? N   Do you have problems with loss of bowel control? N   Managing your Medications? Y   Managing your Finances? Y   Housekeeping or managing your Housekeeping? Y     Patient Care Team: Horald Pollen, MD as PCP - General (Internal Medicine) Lindy, [place], OD as Consulting Physician (Optometry)  Indicate any recent Medical Services you may have received from other than [facility] providers in the past year (date may be approximate).     Assessment:   This is a routine wellness examination for [place].  Hearing/Vision screen Vision Screening - Comments:: Annual eye exams wear glasses   Dietary issues and exercise activities discussed: Current Exercise Habits: Home exercise routine, Type of exercise: walking, Time (Minutes): 60, Frequency (Times/Week): 4, Weekly Exercise (Minutes/Week): 240, Intensity: Mild, Exercise limited by: neurologic condition(s)   Goals Addressed             This Visit's Progress    Patient reguires daily care for ADLs. She has vascular dementia.   On track    Care Coordination Interventions:  Active listening / Reflection utilized  Problem Solving [place] strategies reviewed Discussed Care Coordination program support available to client with Josie Dixon, spouse of client Discussed client needs. Eduard Clos said client has had a history  of  strokes and requires daily care.   She is eating adequately. She is at risk for falls. Eduard Clos said she receives physical therapy one time weekly in the home Invited Xanthe Couillard to call LCSW as needed at [phone] to further discuss Care Coordination program support for Cindia Hustead (as needed)        Depression Screen    08/28/2021    9:58 AM 08/07/2021    2:39 PM 02/10/2021   10:07 AM 12/30/2020    9:29 AM 12/28/2020    3:18 PM 08/25/2020    1:38 PM 07/20/2020    1:26 PM  PHQ 2/9 Scores  PHQ - 2 Score 0 0 0 0 0 0 0  PHQ- 9 Score    4   0    Fall Risk    08/28/2021    9:52 AM 08/07/2021    4:47 PM 08/07/2021    2:39 PM 02/10/2021   10:07 AM 12/30/2020    9:28 AM  Fall Risk   Falls in the past year? '1 1 1 1 '$ 0  Number falls in past yr: 0 1 0 0 0  Injury with Fall? '1 1 1 1   '$ Comment broken arm   laceration, abraison   Risk for fall due to : History of fall(s)      Follow up Falls evaluation completed;Education provided        FALL RISK PREVENTION PERTAINING TO THE HOME:  Any stairs in or around the home? Yes  If so, are there any without handrails? No  Home free of loose throw rugs in walkways, pet beds, electrical cords, etc? Yes  Adequate lighting in your home to reduce risk of falls? Yes    ASSISTIVE DEVICES UTILIZED TO PREVENT FALLS:  Life alert? No  Use of a cane, walker or w/c? Yes  Grab bars in the bathroom? Yes  Shower chair or bench in shower? Yes  Elevated toilet seat or a handicapped toilet? Yes       04/12/2020    9:03 AM 06/11/2019    7:23 AM  MMSE - Mini Mental State Exam  Orientation to time 4 5  Orientation to Place 5 5  Registration 3 3  Attention/ Calculation 2 3  Recall 1 2  Language- name 2 objects 2 2  Language- repeat 1 1  Language- follow 3 step command 3 3  Language- read & follow direction 1 1  Write a sentence 1 0  Copy design 0 0  Total score 23 25        08/28/2021   10:07 AM  6CIT Screen  What Year? 0 points  What  month? 0 points  What time? 3 points  Count back from 20 2 points  Months in reverse 2 points  Repeat phrase 2 points  Total Score 9 points    Immunizations Immunization History  Administered Date(s) Administered   Tdap 07/25/2011, 07/29/2021    TDAP status: Up to date  Flu Vaccine status: Declined, Education has been provided regarding the importance of this vaccine but patient still declined. Advised may receive this vaccine at local pharmacy or Health Dept. Aware to provide a copy of the vaccination record if obtained from local pharmacy or Health Dept. Verbalized acceptance and understanding.  Pneumococcal vaccine status: Declined,  Education has been provided regarding the importance of this vaccine but patient still declined. Advised may receive this vaccine at local pharmacy or Health Dept. Aware to provide a copy of the  vaccination record if obtained from local pharmacy or Health Dept. Verbalized acceptance and understanding.   Covid-19 vaccine status: Declined, Education has been provided regarding the importance of this vaccine but patient still declined. Advised may receive this vaccine at local pharmacy or Health Dept.or vaccine clinic. Aware to provide a copy of the vaccination record if obtained from local pharmacy or Health Dept. Verbalized acceptance and understanding.  Qualifies for Shingles Vaccine? Yes   Zostavax completed No   Shingrix Completed?: No.    Education has been provided regarding the importance of this vaccine. Patient has been advised to call insurance company to determine out of pocket expense if they have not yet received this vaccine. Advised may also receive vaccine at local pharmacy or Health Dept. Verbalized acceptance and understanding.  Screening Tests Health Maintenance  Topic Date Due   MAMMOGRAM  08/17/2021   INFLUENZA VACCINE  08/01/2021   Pneumonia Vaccine 21+ Years old (1 - PCV) 12/28/2021 (Originally 05/25/2018)   Zoster Vaccines-  Shingrix (1 of 2) 02/07/2022 (Originally 05/24/1974)   Fecal DNA (Cologuard)  11/12/2022   TETANUS/TDAP  07/30/2031   DEXA SCAN  Completed   Hepatitis C Screening  Completed   HPV VACCINES  Aged Out   COVID-19 Vaccine  Discontinued    Health Maintenance  Health Maintenance Due  Topic Date Due   MAMMOGRAM  08/17/2021   INFLUENZA VACCINE  08/01/2021    Colorectal cancer screening: Type of screening: Cologuard. Completed 11/12/2019. Repeat every 3 years  Mammogram status: Ordered declined at this time due to surgery will contact PCP . Pt provided with contact info and advised to call to schedule appt.   Bone Density status: Ordered 08/28/2021. Pt provided with contact info and advised to call to schedule appt.  Lung Cancer Screening: (Low Dose CT Chest recommended if Age 66-80 years, 30 pack-year currently smoking OR have quit w/in 15years.) does not qualify.   Lung Cancer Screening Referral: n/a  Additional Screening:  Hepatitis C Screening: does not qualify;   Vision Screening: Recommended annual ophthalmology exams for early detection of glaucoma and other disorders of the eye. Is the patient up to date with their annual eye exam?  Yes  Who is the provider or what is the name of the office in which the patient attends annual eye exams? [facility]  If pt is not established with a provider, would they like to be referred to a provider to establish care? No .   Dental Screening: Recommended annual dental exams for proper oral hygiene  Community Resource Referral / Chronic Care Management: CRR required this visit?  No   CCM required this visit?  No      Plan:     I have personally reviewed and noted the following in the patient's chart:   Medical and social history Use of alcohol, tobacco or illicit drugs  Current medications and supplements including opioid prescriptions. Patient is not currently taking opioid prescriptions. Functional ability and  status Nutritional status Physical activity Advanced directives List of other physicians Hospitalizations, surgeries, and ER visits in previous 12 months Vitals Screenings to include cognitive, depression, and falls Referrals and appointments  In addition, I have reviewed and discussed with patient certain preventive protocols, quality metrics, and best practice recommendations. A written personalized care plan for preventive services as well as general preventive health recommendations were provided to patient.     [name], LPN   1/61/0960   Nurse Notes: Aim for 30 minutes  of exercise or brisk walking, 6-8 glasses of water, and 5 servings of fruits and vegetables each day.

## 2021-09-05 DIAGNOSIS — S42492D Other displaced fracture of lower end of left humerus, subsequent encounter for fracture with routine healing: Secondary | ICD-10-CM | POA: Diagnosis not present

## 2021-09-06 DIAGNOSIS — M9901 Segmental and somatic dysfunction of cervical region: Secondary | ICD-10-CM | POA: Diagnosis not present

## 2021-09-06 DIAGNOSIS — M9902 Segmental and somatic dysfunction of thoracic region: Secondary | ICD-10-CM | POA: Diagnosis not present

## 2021-09-06 DIAGNOSIS — M9904 Segmental and somatic dysfunction of sacral region: Secondary | ICD-10-CM | POA: Diagnosis not present

## 2021-09-06 DIAGNOSIS — M9903 Segmental and somatic dysfunction of lumbar region: Secondary | ICD-10-CM | POA: Diagnosis not present

## 2021-09-20 NOTE — Progress Notes (Signed)
Carelink Summary Report / Loop Recorder 

## 2021-10-02 ENCOUNTER — Ambulatory Visit (INDEPENDENT_AMBULATORY_CARE_PROVIDER_SITE_OTHER): Payer: Medicare HMO

## 2021-10-02 DIAGNOSIS — I63321 Cerebral infarction due to thrombosis of right anterior cerebral artery: Secondary | ICD-10-CM

## 2021-10-03 DIAGNOSIS — M9904 Segmental and somatic dysfunction of sacral region: Secondary | ICD-10-CM | POA: Diagnosis not present

## 2021-10-03 DIAGNOSIS — M9901 Segmental and somatic dysfunction of cervical region: Secondary | ICD-10-CM | POA: Diagnosis not present

## 2021-10-03 DIAGNOSIS — M9903 Segmental and somatic dysfunction of lumbar region: Secondary | ICD-10-CM | POA: Diagnosis not present

## 2021-10-03 DIAGNOSIS — M9902 Segmental and somatic dysfunction of thoracic region: Secondary | ICD-10-CM | POA: Diagnosis not present

## 2021-10-03 LAB — CUP PACEART REMOTE DEVICE CHECK
Date Time Interrogation Session: 20231001231043
Implantable Pulse Generator Implant Date: 20211020

## 2021-10-16 NOTE — Progress Notes (Signed)
Carelink Summary Report / Loop Recorder 

## 2021-10-17 DIAGNOSIS — S42492D Other displaced fracture of lower end of left humerus, subsequent encounter for fracture with routine healing: Secondary | ICD-10-CM | POA: Diagnosis not present

## 2021-10-19 ENCOUNTER — Other Ambulatory Visit: Payer: Self-pay

## 2021-10-19 ENCOUNTER — Emergency Department (HOSPITAL_BASED_OUTPATIENT_CLINIC_OR_DEPARTMENT_OTHER): Payer: Medicare HMO

## 2021-10-19 ENCOUNTER — Other Ambulatory Visit (HOSPITAL_BASED_OUTPATIENT_CLINIC_OR_DEPARTMENT_OTHER): Payer: Self-pay

## 2021-10-19 ENCOUNTER — Emergency Department (HOSPITAL_BASED_OUTPATIENT_CLINIC_OR_DEPARTMENT_OTHER)
Admission: EM | Admit: 2021-10-19 | Discharge: 2021-10-19 | Disposition: A | Discharge status: Home / Self Care | Payer: Medicare HMO | Attending: Emergency Medicine | Admitting: Emergency Medicine

## 2021-10-19 ENCOUNTER — Telehealth: Payer: Self-pay

## 2021-10-19 ENCOUNTER — Encounter (HOSPITAL_BASED_OUTPATIENT_CLINIC_OR_DEPARTMENT_OTHER): Payer: Self-pay

## 2021-10-19 DIAGNOSIS — I10 Essential (primary) hypertension: Secondary | ICD-10-CM | POA: Diagnosis not present

## 2021-10-19 DIAGNOSIS — R1031 Right lower quadrant pain: Secondary | ICD-10-CM

## 2021-10-19 DIAGNOSIS — R1030 Lower abdominal pain, unspecified: Secondary | ICD-10-CM | POA: Diagnosis present

## 2021-10-19 DIAGNOSIS — Z7982 Long term (current) use of aspirin: Secondary | ICD-10-CM | POA: Diagnosis not present

## 2021-10-19 DIAGNOSIS — R112 Nausea with vomiting, unspecified: Secondary | ICD-10-CM

## 2021-10-19 DIAGNOSIS — Z79899 Other long term (current) drug therapy: Secondary | ICD-10-CM | POA: Diagnosis not present

## 2021-10-19 DIAGNOSIS — K5900 Constipation, unspecified: Secondary | ICD-10-CM | POA: Diagnosis not present

## 2021-10-19 LAB — URINALYSIS, ROUTINE W REFLEX MICROSCOPIC
Bilirubin Urine: NEGATIVE
Glucose, UA: NEGATIVE mg/dL
Hgb urine dipstick: NEGATIVE
Ketones, ur: NEGATIVE mg/dL
Leukocytes,Ua: NEGATIVE
Nitrite: NEGATIVE
Specific Gravity, Urine: 1.024 (ref 1.005–1.030)
pH: 5.5 (ref 5.0–8.0)

## 2021-10-19 LAB — COMPREHENSIVE METABOLIC PANEL
ALT: 11 U/L (ref 0–44)
AST: 14 U/L — ABNORMAL LOW (ref 15–41)
Albumin: 4.1 g/dL (ref 3.5–5.0)
Alkaline Phosphatase: 58 U/L (ref 38–126)
Anion gap: 8 (ref 5–15)
BUN: 25 mg/dL — ABNORMAL HIGH (ref 8–23)
CO2: 24 mmol/L (ref 22–32)
Calcium: 9.2 mg/dL (ref 8.9–10.3)
Chloride: 104 mmol/L (ref 98–111)
Creatinine, Ser: 1.03 mg/dL — ABNORMAL HIGH (ref 0.44–1.00)
GFR, Estimated: 59 mL/min — ABNORMAL LOW (ref >60)
Glucose, Bld: 115 mg/dL — ABNORMAL HIGH (ref 70–99)
Potassium: 3.9 mmol/L (ref 3.5–5.1)
Sodium: 136 mmol/L (ref 135–145)
Total Bilirubin: 0.5 mg/dL (ref 0.3–1.2)
Total Protein: 7.3 g/dL (ref 6.5–8.1)

## 2021-10-19 LAB — CBC
HCT: 38.7 % (ref 36.0–46.0)
Hemoglobin: 12.5 g/dL (ref 12.0–15.0)
MCH: 28.2 pg (ref 26.0–34.0)
MCHC: 32.3 g/dL (ref 30.0–36.0)
MCV: 87.4 fL (ref 80.0–100.0)
Platelets: 233 10*3/uL (ref 150–400)
RBC: 4.43 MIL/uL (ref 3.87–5.11)
RDW: 13.5 % (ref 11.5–15.5)
WBC: 5.9 10*3/uL (ref 4.0–10.5)
nRBC: 0 % (ref 0.0–0.2)

## 2021-10-19 LAB — LIPASE, BLOOD: Lipase: 44 U/L (ref 11–51)

## 2021-10-19 MED ORDER — ONDANSETRON 4 MG PO TBDP: 4.0000 mg | ORAL_TABLET | Freq: Three times a day (TID) | ORAL | 0 refills | Status: DC | PRN

## 2021-10-19 MED ORDER — GLYCERIN (LAXATIVE) 2.1 G RE SUPP
1.0000 | Freq: Once | RECTAL | Status: AC
  Administered 2021-10-19: 1 via RECTAL
  Filled 2021-10-19: qty 1

## 2021-10-19 MED ORDER — SODIUM CHLORIDE 0.9 % IV BOLUS
1000.0000 mL | Freq: Once | INTRAVENOUS | Status: AC
  Administered 2021-10-19: 1000 mL via INTRAVENOUS

## 2021-10-19 MED ORDER — POLYETHYLENE GLYCOL 3350 17 G PO PACK
17.0000 g | PACK | Freq: Every day | ORAL | Status: DC
  Administered 2021-10-19: 17 g via ORAL
  Filled 2021-10-19: qty 1

## 2021-10-19 NOTE — ED Notes (Signed)
Pt. Ambulated to bathroom at this time.

## 2021-10-19 NOTE — Discharge Instructions (Signed)
Home to continue with Miralax or other bowel regimen of choice. Right return to the ER for vomiting, abdominal pain, inability to pass gas or stool. Zofran as needed as prescribed for nausea and vomiting.  Recheck with your provider.

## 2021-10-19 NOTE — Telephone Encounter (Signed)
Pt is currently at ED.  Nurse Assessment Nurse: Vallery Sa, RN, Cathy Date/Time (Eastern Time): 10/19/2021 11:03:56 AM Confirm and document reason for call. If symptomatic, describe symptoms. ---Caller states [name] developed pain with urination, a headache (rated as a 2 on the 1 to 10 scale), vomiting (2 episodes in the past 24 hours) and fever (low grade by touch) yesterday. Alert and responsive. Does the patient have any new or worsening symptoms? ---Yes Will a triage be completed? ---Yes Related visit to physician within the last 2 weeks? ---No Does the PT have any chronic conditions? (i.e. diabetes, asthma, this includes High risk factors for pregnancy, etc.) ---Yes List chronic conditions. ---Ischemic episodes in the past, Fracture of humerus 12 weeks ago Is this a behavioral health or substance abuse call? ---No Guidelines Guideline Title Affirmed Question Affirmed Notes Nurse Date/Time Eilene Ghazi Time) Urination Pain - Female Vomiting [place], RN, [place] 10/19/2021 11:07:03 AM Disp. Time Eilene Ghazi Time) Disposition Final User 10/19/2021 10:44:35 AM Attempt made - no message left Vallery Sa, RN, Cathy Disp.  10/19/2021 11:07:49 AM Go to ED Now (or PCP triage) Yes Vallery Sa, RN, T[place] Final Disposition 10/19/2021 11:07:49 AM Go to ED Now (or PCP triage) Yes Vallery Sa, RN, [place]y [place] Disagree/Comply Comply Caller Understands Yes PreDisposition Go to ED Care Advice Given Per Guideline GO TO ED NOW (OR PCP TRIAGE): * IF NO PCP (PRIMARY CARE PROVIDER) SECOND-LEVEL TRIAGE: You need to be seen within the next hour. Go to the [facility] at _____________ [place] as soon as you can. CARE ADVICE given per Urination Pain - Female (Adult) guideline.

## 2021-10-19 NOTE — Telephone Encounter (Signed)
Thank you :)

## 2021-10-19 NOTE — ED Provider Notes (Signed)
[facility] Provider Note   CSN: [id number] Arrival date & time: 10/19/21  1237     History  Chief Complaint  Patient presents with   Multiple Complaints    Jacqueline Bonilla is a 66 y.o. female.  66 year old female with past medical history of CVA prior disease on Brilinta), anemia, depression, hypertension brought in by husband with concern for dysuria x2 days with 2 episodes of violent vomiting which occurred yesterday, followed by a headache.  Also reports constipation with last bowel movement 2 days ago, is passing gas.  Reports lower abdominal discomfort which she relates to her dysuria.  States that she is now developing pain in her urethra.  Due to prior strokes, patient is a poor historian, is assisted with her husband who assist with providing details as above.  Denies fevers, chills.  No known sick contacts.       Home Medications Prior to Admission medications   Medication Sig Start Date End Date Taking? Authorizing Provider  ondansetron (ZOFRAN-ODT) 4 MG disintegrating tablet Take 1 tablet (4 mg total) by mouth every 8 (eight) hours as needed for nausea or vomiting. 10/19/21  Yes Tacy Learn, PA-C  acetaminophen (TYLENOL) 500 MG tablet Take 2 tablets (1,000 mg total) by mouth 3 (three) times daily. 08/01/21   Ainsley Spinner, PA-C  Amino Acids Complex TABS Take 1 tablet by mouth daily.    [provider]  aspirin EC 81 MG tablet Take 81 mg by mouth daily. Swallow whole.    [provider]  atorvastatin (LIPITOR) 40 MG tablet Take 1 tablet (40 mg total) by mouth daily. Patient not taking: Reported on 08/28/2021 12/15/20   Cathlyn Parsons, PA-C  BRILINTA 90 MG TABS tablet TAKE 1 TABLET(90 MG) BY MOUTH TWICE DAILY 08/20/21   Horald Pollen, MD  CINNAMON PO Take 1 capsule by mouth daily.    [provider]  Coenzyme Q10 (CO Q 10 PO) Take 1 capsule by mouth daily.    [provider]  donepezil  (ARICEPT) 10 MG tablet Take 1 tablet (10 mg total) by mouth at bedtime. 02/21/21   [name], MD  losartan (COZAAR) 25 MG tablet Take 0.5 tablets (12.5 mg total) by mouth daily. 04/24/21   Horald Pollen, MD  MAGNESIUM PO Take 1 tablet by mouth at bedtime.    [provider]  melatonin 5 MG TABS Take 1 tablet (5 mg total) by mouth at bedtime as needed. 12/15/20   Angiulli, Lavon Paganini, PA-C  Multiple Vitamin (MULTIVITAMIN WITH MINERALS) TABS tablet Take 1 tablet by mouth daily.    [provider]  Multiple Vitamins-Minerals (ZINC PO) Take 1 tablet by mouth daily.    [provider]  ondansetron (ZOFRAN-ODT) 4 MG disintegrating tablet Take 1 tablet (4 mg total) by mouth every 8 (eight) hours as needed for nausea or vomiting. 10/19/21   Tacy Learn, PA-C  oxyCODONE (OXY IR/ROXICODONE) 5 MG immediate release tablet Take 1 tablet (5 mg total) by mouth every 6 (six) hours as needed for moderate pain. Patient not taking: Reported on 08/28/2021 08/01/21   Ainsley Spinner, PA-C  pantoprazole (PROTONIX) 40 MG tablet Take 1 tablet (40 mg total) by mouth daily. 12/15/20   Angiulli, Lavon Paganini, PA-C      Allergies    Avelox [moxifloxacin], Ciprofloxacin, Floraquin [iodoquinol], Iohexol, Levaquin [levofloxacin], Plavix [clopidogrel], Quinolones, and Shellfish allergy    Review of Systems   Review of Systems Negative except  as per HPI Physical Exam Updated Vital Signs BP 130/81   Pulse 82   Temp 98.4 F (36.9 C) (Oral)   Resp 18   Ht '5\' 4"'$  (1.626 m)   Wt 54.4 kg   LMP 01/02/2003   SpO2 97%   BMI 20.59 kg/m  Physical Exam Vitals and nursing note reviewed.  Constitutional:      General: She is not in acute distress.    Appearance: She is well-developed. She is not diaphoretic.  HENT:     Head: Normocephalic and atraumatic.  Cardiovascular:     Rate and Rhythm: Normal rate and regular rhythm.     Heart sounds: Normal heart sounds.  Pulmonary:     Effort:  Pulmonary effort is normal.     Breath sounds: Normal breath sounds.  Abdominal:     Palpations: Abdomen is soft.     Tenderness: There is abdominal tenderness in the right lower quadrant and suprapubic area. There is no right CVA tenderness, left CVA tenderness or rebound.  Musculoskeletal:     Right lower leg: No edema.     Left lower leg: No edema.  Skin:    General: Skin is warm and dry.     Findings: No erythema or rash.  Neurological:     Mental Status: She is alert and oriented to person, place, and time.  Psychiatric:        Behavior: Behavior normal.     ED Results / Procedures / Treatments   Labs (all labs ordered are listed, but only abnormal results are displayed) Labs Reviewed  COMPREHENSIVE METABOLIC PANEL - Abnormal; Notable for the following components:      Result Value   Glucose, Bld 115 (*)    BUN 25 (*)    Creatinine, Ser 1.03 (*)    AST 14 (*)    GFR, Estimated 59 (*)    All other components within normal limits  URINALYSIS, ROUTINE W REFLEX MICROSCOPIC - Abnormal; Notable for the following components:   Protein, ur TRACE (*)    All other components within normal limits  LIPASE, BLOOD  CBC    EKG None  Radiology CT ABDOMEN PELVIS WO CONTRAST  Result Date: 10/19/2021 CLINICAL DATA:  Nausea, vomiting, constipation and dysuria. Right-sided abdominal pain. EXAM: CT ABDOMEN AND PELVIS WITHOUT CONTRAST TECHNIQUE: Multidetector CT imaging of the abdomen and pelvis was performed following the standard protocol without IV contrast. RADIATION DOSE REDUCTION: This exam was performed according to the departmental dose-optimization program which includes automated exposure control, adjustment of the mA and/or kV according to patient size and/or use of iterative reconstruction technique. COMPARISON:  CT of the abdomen and pelvis with contrast on 10/20/2020 FINDINGS: Lower chest: No acute abnormality. Hepatobiliary: Stable benign cyst in the posterior aspect of the  right lobe of the liver. The gallbladder appears normal. Pancreas: Unremarkable. No pancreatic ductal dilatation or surrounding inflammatory changes. Spleen: Normal in size without focal abnormality. Adrenals/Urinary Tract: Stable benign simple cyst of the lower pole of the left kidney. No evidence of hydronephrosis or urinary tract calculi. The bladder is completely decompressed. Stomach/Bowel: Moderate fecal material throughout much of the colon with some gaseous distension of other segments of the colon. This is a partially chronic finding. Overall ileus pattern appears slightly worse compared to the prior study. There are some nondistended fluid-filled loops of small bowel present in the abdomen and pelvis which is a nonspecific finding but may implicate component of underlying enteritis. No evidence of overt small  bowel obstruction. No free intraperitoneal air identified. The appendix is not visualized as a discrete structure. Vascular/Lymphatic: No significant vascular findings are present. No enlarged abdominal or pelvic lymph nodes. Reproductive: Uterus and bilateral adnexa are unremarkable. Other: No abdominal wall hernia or abnormality. No abdominopelvic ascites. Musculoskeletal: No acute or significant osseous findings. IMPRESSION: Moderate fecal material throughout much of the colon with some gaseous distension of other segments of the colon. This is a partially chronic finding. Overall ileus pattern appears slightly worse compared to the prior study. There are some nondistended fluid-filled loops of small bowel present in the abdomen and pelvis which is a nonspecific finding but may implicate component of underlying enteritis. No evidence of overt small bowel obstruction. Electronically Signed   By: Aletta Edouard M.D.   On: 10/19/2021 16:50    Procedures Procedures    Medications Ordered in ED Medications  polyethylene glycol (MIRALAX / GLYCOLAX) packet 17 g (17 g Oral Given 10/19/21 1842)   sodium chloride 0.9 % bolus 1,000 mL (0 mLs Intravenous Stopped 10/19/21 1709)  Glycerin (Adult) 2.1 g suppository 1 suppository (1 suppository Rectal Given 10/19/21 1903)    ED Course/ Medical Decision Making/ A&P                           Medical Decision Making Amount and/or Complexity of Data Reviewed Labs: ordered. Radiology: ordered.  Risk OTC drugs. Prescription drug management.   This patient presents to the ED for concern of vomiting, found to have right lower quadrant abdominal pain, dysuria, this involves an extensive number of treatment options, and is a complaint that carries with it a high risk of complications and morbidity.  The differential diagnosis includes but not limited to urinary tract infection, appendicitis, bowel obstruction, constipation   Co morbidities that complicate the patient evaluation  Hypertension, CVA, dyslipidemia, anemia   Additional history obtained:  Additional history obtained from husband at bedside who assist with history as above External records from outside source obtained and reviewed including prior CT on file showing similar findings to today's presentation   Lab Tests:  I Ordered, and personally interpreted labs.  The pertinent results include: CBC is unremarkable, urinalysis with trace protein, no evidence of infection.  Lipase is normal.  CMP with mildly elevated creatinine at 1.03 compared to prior on file.   Imaging Studies ordered:  I ordered imaging studies including CT abdomen pelvis with oral contrast I independently visualized and interpreted imaging which showed no significant change from prior on file, ileus without SBO I agree with the radiologist interpretation  Consultations Obtained:  I requested consultation with the ER attending, Dr. Sharlett Iles,  and discussed lab and imaging findings as well as pertinent plan - they recommend: MiraLAX and glycerin suppository, can discharge if able to tolerate p.o.'s  with plan to follow-up with PCP   Problem List / ED Course / Critical interventions / Medication management  65 year old female presents with complaint of dysuria for the past 2 days with vomiting yesterday followed by headache.  On exam, she is found have tenderness in her right lower quadrant and states she is not have bowel movement in the past 2 days but is passing gas.  Patient was given IV fluids, labs obtained and a CT of the abdomen pelvis with out contrast due to contrast allergy, did drink oral contrast.  On labs, she does have a mildly elevated creatinine compared to prior, urine does not suggest infection, lipase  is normal and white count is normal.  CT abdomen pelvis with question of ileus, unchanged from prior significantly, no bowel obstruction, appendix is normal.  Patient was given MiraLAX and glycerin suppository, states that she does feel the urge to use the bathroom but has not gone as of yet.  She is feeling overall improved and requesting discharge to go home and continue bowel prep at home.  Verbalizes understanding and instructions return to ER for worsening or concerning symptoms otherwise follow-up with her primary care provider. I ordered medication including MiraLAX, glycerin suppository for constipation Reevaluation of the patient after these medicines showed that the patient stayed the same I have reviewed the patients home medicines and have made adjustments as needed   Social Determinants of Health:  Lives with husband, has PCP for follow-up care   Test / Admission - Considered:  Consider admission for ileus however patient is passing gas, no longer vomiting.  She would like to be discharged with return precautions that if she has further vomiting, develops abdominal pain or distention or is unable to pass gas or stool will return to the ER.         Final Clinical Impression(s) / ED Diagnoses Final diagnoses:  Right lower quadrant abdominal pain  Nausea  and vomiting, unspecified vomiting type    Rx / DC Orders ED Discharge Orders          Ordered    ondansetron (ZOFRAN-ODT) 4 MG disintegrating tablet  Every 8 hours PRN        10/19/21 1936    ondansetron (ZOFRAN-ODT) 4 MG disintegrating tablet  Every 8 hours PRN        10/19/21 1936              Roque Lias 10/19/21 2207    Charlesetta Shanks, MD 10/24/21 1552

## 2021-10-19 NOTE — ED Triage Notes (Signed)
Patient here POV from Home.  Endorses Nausea, Emesis, Constipation, Headache that began approximately 24 Hours ago. Dysuria began 48 Hours ago.   No Known Fevers.   NAD Noted during Triage. A&Ox4. GCS 15. Ambulatory.

## 2021-10-20 ENCOUNTER — Ambulatory Visit: Payer: Medicare HMO | Admitting: Family Medicine

## 2021-10-23 ENCOUNTER — Ambulatory Visit (INDEPENDENT_AMBULATORY_CARE_PROVIDER_SITE_OTHER): Payer: Medicare HMO | Admitting: Emergency Medicine

## 2021-10-23 ENCOUNTER — Encounter: Payer: Self-pay | Admitting: Emergency Medicine

## 2021-10-23 VITALS — BP 110/62 | HR 77 | Temp 98.4°F | Ht 64.0 in | Wt 118.4 lb

## 2021-10-23 DIAGNOSIS — R1030 Lower abdominal pain, unspecified: Secondary | ICD-10-CM | POA: Insufficient documentation

## 2021-10-23 DIAGNOSIS — K567 Ileus, unspecified: Secondary | ICD-10-CM | POA: Insufficient documentation

## 2021-10-23 DIAGNOSIS — M25512 Pain in left shoulder: Secondary | ICD-10-CM | POA: Diagnosis not present

## 2021-10-23 NOTE — Assessment & Plan Note (Signed)
Clinically much improved.  No red flag signs or symptoms. Recent CT scan of abdomen and pelvis report reviewed with patient. No longer constipated. Benign abdominal examination. Advised to stay well-hydrated and increase amount of fiber in her diet.

## 2021-10-23 NOTE — Patient Instructions (Signed)
Health Maintenance After Age 68 After age 68, you are at a higher risk for certain long-term diseases and infections as well as injuries from falls. Falls are a major cause of broken bones and head injuries in people who are older than age 68. Getting regular preventive care can help to keep you healthy and well. Preventive care includes getting regular testing and making lifestyle changes as recommended by your health care provider. Talk with your health care provider about: Which screenings and tests you should have. A screening is a test that checks for a disease when you have no symptoms. A diet and exercise plan that is right for you. What should I know about screenings and tests to prevent falls? Screening and testing are the best ways to find a health problem early. Early diagnosis and treatment give you the best chance of managing medical conditions that are common after age 68. Certain conditions and lifestyle choices may make you more likely to have a fall. Your health care provider may recommend: Regular vision checks. Poor vision and conditions such as cataracts can make you more likely to have a fall. If you wear glasses, make sure to get your prescription updated if your vision changes. Medicine review. Work with your health care provider to regularly review all of the medicines you are taking, including over-the-counter medicines. Ask your health care provider about any side effects that may make you more likely to have a fall. Tell your health care provider if any medicines that you take make you feel dizzy or sleepy. Strength and balance checks. Your health care provider may recommend certain tests to check your strength and balance while standing, walking, or changing positions. Foot health exam. Foot pain and numbness, as well as not wearing proper footwear, can make you more likely to have a fall. Screenings, including: Osteoporosis screening. Osteoporosis is a condition that causes  the bones to get weaker and break more easily. Blood pressure screening. Blood pressure changes and medicines to control blood pressure can make you feel dizzy. Depression screening. You may be more likely to have a fall if you have a fear of falling, feel depressed, or feel unable to do activities that you used to do. Alcohol use screening. Using too much alcohol can affect your balance and may make you more likely to have a fall. Follow these instructions at home: Lifestyle Do not drink alcohol if: Your health care provider tells you not to drink. If you drink alcohol: Limit how much you have to: 0-1 drink a day for women. 0-2 drinks a day for men. Know how much alcohol is in your drink. In the [place] one drink equals one 12 oz bottle of beer (355 mL), one 5 oz glass of wine (148 mL), or one 1 oz glass of hard liquor (44 mL). Do not use any products that contain nicotine or tobacco. These products include cigarettes, chewing tobacco, and vaping devices, such as e-cigarettes. If you need help quitting, ask your health care provider. Activity  Follow a regular exercise program to stay fit. This will help you maintain your balance. Ask your health care provider what types of exercise are appropriate for you. If you need a cane or walker, use it as recommended by your health care provider. Wear supportive shoes that have nonskid soles. Safety  Remove any tripping hazards, such as rugs, cords, and clutter. Install safety equipment such as grab bars in bathrooms and safety rails on stairs. Keep rooms and walkways   well-lit. General instructions Talk with your health care provider about your risks for falling. Tell your health care provider if: You fall. Be sure to tell your health care provider about all falls, even ones that seem minor. You feel dizzy, tiredness (fatigue), or off-balance. Take over-the-counter and prescription medicines only as told by your health care provider. These include  supplements. Eat a healthy diet and maintain a healthy weight. A healthy diet includes low-fat dairy products, low-fat (lean) meats, and fiber from whole grains, beans, and lots of fruits and vegetables. Stay current with your vaccines. Schedule regular health, dental, and eye exams. Summary Having a healthy lifestyle and getting preventive care can help to protect your health and wellness after age 68. Screening and testing are the best way to find a health problem early and help you avoid having a fall. Early diagnosis and treatment give you the best chance for managing medical conditions that are more common for people who are older than age 68. Falls are a major cause of broken bones and head injuries in people who are older than age 68. Take precautions to prevent a fall at home. Work with your health care provider to learn what changes you can make to improve your health and wellness and to prevent falls. This information is not intended to replace advice given to you by your health care provider. Make sure you discuss any questions you have with your health care provider. Document Revised: 05/09/2020 Document Reviewed: 05/09/2020 Elsevier Patient Education  2023 Elsevier Inc.  

## 2021-10-23 NOTE — Assessment & Plan Note (Signed)
Clinically improved. Having daily bowel movements. No longer nauseous or vomiting.

## 2021-10-23 NOTE — Progress Notes (Signed)
Jacqueline Bonilla 66 y.o.   Chief Complaint  Patient presents with   Follow-up    ER f/u abd pain, constipation, vomiting     HISTORY OF PRESENT ILLNESS: This is a 66 y.o. female here for follow-up of emergency department visit on 10/19/2021 when she presented with lower abdominal pain and projectile vomiting. Normal blood work.  Anemia much improved and back to normal.  Negative urinalysis. CT scan of abdomen and pelvis did not show bowel obstruction but did show pattern of ileus and increase amount of feces in colon. Patient did well after IV fluids and antiemetics.  Was able to go home.  No more vomiting since released.  Has no urinary symptoms.  No fever or chills. Having daily bowel movements.  Denies abdominal pain.  Back to her normal functions. Today has no complaints or any other medical concerns.  HPI   Prior to Admission medications   Medication Sig Start Date End Date Taking? Authorizing Provider  Amino Acids Complex TABS Take 1 tablet by mouth daily.   Yes [provider]  aspirin EC 81 MG tablet Take 81 mg by mouth daily. Swallow whole.   Yes [provider]  BRILINTA 90 MG TABS tablet TAKE 1 TABLET(90 MG) BY MOUTH TWICE DAILY 08/20/21  Yes Atsushi Yom, Ines Bloomer, MD  CINNAMON PO Take 1 capsule by mouth daily.   Yes [provider]  Coenzyme Q10 (CO Q 10 PO) Take 1 capsule by mouth daily.   Yes [provider]  losartan (COZAAR) 25 MG tablet Take 0.5 tablets (12.5 mg total) by mouth daily. 04/24/21  Yes Zarin Hagmann, Ines Bloomer, MD  MAGNESIUM PO Take 1 tablet by mouth at bedtime.   Yes [provider]  Multiple Vitamin (MULTIVITAMIN WITH MINERALS) TABS tablet Take 1 tablet by mouth daily.   Yes [provider]  Multiple Vitamins-Minerals (ZINC PO) Take 1 tablet by mouth daily.   Yes [provider]    Allergies  Allergen Reactions   Avelox [Moxifloxacin]    Ciprofloxacin Hives   Floraquin [Iodoquinol]      No cipro or others   Iohexol      Code: HIVES, Desc: HIVES S/P IVP MANY YRS AGO    Levaquin [Levofloxacin]    Plavix [Clopidogrel] Diarrhea   Quinolones    Shellfish Allergy Itching    Itching under the chin. Described by patient but not seen by husband.    Patient Active Problem List   Diagnosis Date Noted   Anemia due to acute blood loss 08/07/2021   Closed fracture of left elbow 07/29/2021   Change in bowel habit 01/10/2021   Internal hemorrhoids 01/10/2021   Dyslipidemia 12/29/2020   History of stroke 07/14/2020   Normocytic anemia 07/11/2020   Vascular dementia with behavior disturbance ([place]) 10/13/2019   Vascular dementia without behavioral disturbance ([place]) 07/15/2019   History of COVID-19 04/22/2019   Essential hypertension 03/03/2019    Past Medical History:  Diagnosis Date   Anemia    Anxiety    Depression    Dysmenorrhea    Endometriosis    Fibroid    Hypertension    Implantable loop recorder present 2021   Microhematuria    negative workup   Osteoporosis    Tachycardia    Thrombocytopenia ([place])     Past Surgical History:  Procedure Laterality Date   BREAST BIOPSY     BUBBLE STUDY  12/08/2020   Procedure: BUBBLE STUDY;  Surgeon: Josue Hector, MD;  Location: [facility];  Service: Cardiovascular;;   CESAREAN SECTION     hysteroscopic resection     implantable loop recorder implant  10/21/2019   Medtronic Reveal Linq model LNQ 22 (SN [id number]) implantable loop recorder   IR CT HEAD LTD  12/01/2020   IR PERCUTANEOUS ART THROMBECTOMY/INFUSION INTRACRANIAL INC DIAG ANGIO  12/01/2020   IR US GUIDE VASC ACCESS RIGHT  12/01/2020   ORIF HUMERUS FRACTURE Left 07/31/2021   Procedure: OPEN REDUCTION INTERNAL FIXATION (ORIF) DISTAL HUMERUS FRACTURE;  Surgeon: Shona Needles, MD;  Location: [place];  Service: Orthopedics;  Laterality: Left;   RADIOLOGY WITH ANESTHESIA N/A 12/01/2020   Procedure: IR WITH ANESTHESIA - CODE STROKE;  Surgeon: Radiologist, Medication,  MD;  Location: [place];  Service: Radiology;  Laterality: N/A;   TEE WITHOUT CARDIOVERSION N/A 12/08/2020   Procedure: TRANSESOPHAGEAL ECHOCARDIOGRAM (TEE);  Surgeon: Josue Hector, MD;  Location[facility];  Service: Cardiovascular;  Laterality: N/A;    Social History   Socioeconomic History   Marital status: Married    Spouse name: Psychologist, prison and probation services   Number of children: 1   Years of education: Not on file   Highest education level: Not on file  Occupational History   Occupation: accounting  Tobacco Use   Smoking status: Never   Smokeless tobacco: Never  Vaping Use   Vaping Use: Never used  Substance and Sexual Activity   Alcohol use: Never   Drug use: Never   Sexual activity: Not Currently    Partners: Male    Comment: husband vasectomy  Other Topics Concern   Not on file  Social History Narrative   Lives with husband   Grandchildren - 1   Works - Investment banker, 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%   Gun in home - yes - secured      Right handed   Caffeine: maybe tea every now and then   Social Determinants of Radio broadcast assistant Strain: Low Risk  (08/28/2021)   Overall Financial Resource Strain (CARDIA)    Difficulty of Paying Living Expenses: Not hard at all  Food Insecurity: No Food Insecurity (08/28/2021)   Hunger Vital Sign    Worried About Running Out of Food in the Last Year: Never true    [place] in the Last Year: Never true  Transportation Needs: No Transportation Needs (08/28/2021)   PRAPARE - Hydrologist (Medical): No    Lack of Transportation (Non-Medical): No  Physical Activity: Sufficiently Active (08/28/2021)   Exercise Vital Sign    Days of Exercise per Week: 4 days    Minutes of Exercise per Session: 60 min  Stress: No Stress Concern Present (08/28/2021)   [place]    Feeling of Stress : Not at all  Social Connections: Moderately Integrated (08/28/2021)   Social  Connection and Isolation Panel [NHANES]    Frequency of Communication with Friends and Family: More than three times a week    Frequency of Social Gatherings with Friends and Family: Never    Attends Religious Services: More than 4 times per year    Active Member of [employer] or Organizations: No    Attends Archivist Meetings: Never    Marital Status: Married  Human resources officer Violence: Not At Risk (08/28/2021)   Humiliation, Afraid, Rape, and Kick questionnaire    Fear of Current or Ex-Partner: No    Emotionally Abused:  No    Physically Abused: No    Sexually Abused: No    Family History  Problem Relation Age of Onset   Cancer Father        non hodgkin lymphoma & skin   Heart attack Maternal Grandfather    Dementia Mother    Polymyositis Sister      Review of Systems  Constitutional: Negative.  Negative for chills and fever.  HENT: Negative.  Negative for congestion and sore throat.   Respiratory: Negative.  Negative for cough and shortness of breath.   Cardiovascular: Negative.  Negative for chest pain and palpitations.  Gastrointestinal:  Negative for abdominal pain, nausea and vomiting.  Skin: Negative.  Negative for rash.  Neurological: Negative.  Negative for dizziness.  All other systems reviewed and are negative.   Today's Vitals   10/23/21 1419  BP: 110/62  Pulse: 77  Temp: 98.4 F (36.9 C)  TempSrc: Oral  SpO2: 98%  Weight: 118 lb 6 oz (53.7 kg)  Height: '5\' 4"'$  (1.626 m)   Body mass index is 20.32 kg/m. Wt Readings from Last 3 Encounters:  10/23/21 118 lb 6 oz (53.7 kg)  10/19/21 119 lb 14.9 oz (54.4 kg)  08/28/21 120 lb (54.4 kg)    Physical Exam Vitals reviewed.  Constitutional:      Appearance: Normal appearance.  HENT:     Head: Normocephalic.     Mouth/Throat:     Mouth: Mucous membranes are moist.     Pharynx: Oropharynx is clear.  Eyes:     Extraocular Movements: Extraocular movements intact.     Conjunctiva/sclera: Conjunctivae  normal.     Pupils: Pupils are equal, round, and reactive to light.  Cardiovascular:     Rate and Rhythm: Normal rate and regular rhythm.     Pulses: Normal pulses.     Heart sounds: Normal heart sounds.  Pulmonary:     Effort: Pulmonary effort is normal.     Breath sounds: Normal breath sounds.  Abdominal:     Palpations: Abdomen is soft.     Tenderness: There is no abdominal tenderness.  Musculoskeletal:     Cervical back: No tenderness.     Right lower leg: No edema.     Left lower leg: No edema.  Lymphadenopathy:     Cervical: No cervical adenopathy.  Skin:    General: Skin is warm and dry.  Neurological:     General: No focal deficit present.     Mental Status: She is alert and oriented to person, place, and time.  Psychiatric:        Mood and Affect: Mood normal.        Behavior: Behavior normal.      ASSESSMENT & PLAN: A total of 44 minutes was spent with the patient and counseling/coordination of care regarding preparing for this visit, review of most recent office visit notes, review of most recent emergency department visit notes, review of most recent blood work results, review of most recent CT scan of abdomen and pelvis report, review of most recent urinalysis, differential diagnosis of lower abdominal pain and management, review of multiple chronic medical problems and their management, review of all medications, education on nutrition, prognosis, documentation, and need for follow-up.  Problem List Items Addressed This Visit       Digestive   Ileus ([place])    Clinically improved. Having daily bowel movements. No longer nauseous or vomiting.        Other  Lower abdominal pain - Primary    Clinically much improved.  No red flag signs or symptoms. Recent CT scan of abdomen and pelvis report reviewed with patient. No longer constipated. Benign abdominal examination. Advised to stay well-hydrated and increase amount of fiber in her diet.      Patient  Instructions  Health Maintenance After Age 41 After age 54, you are at a higher risk for certain long-term diseases and infections as well as injuries from falls. Falls are a major cause of broken bones and head injuries in people who are older than age 65. Getting regular preventive care can help to keep you healthy and well. Preventive care includes getting regular testing and making lifestyle changes as recommended by your health care provider. Talk with your health care provider about: Which screenings and tests you should have. A screening is a test that checks for a disease when you have no symptoms. A diet and exercise plan that is right for you. What should I know about screenings and tests to prevent falls? Screening and testing are the best ways to find a health problem early. Early diagnosis and treatment give you the best chance of managing medical conditions that are common after age 50. Certain conditions and lifestyle choices may make you more likely to have a fall. Your health care provider may recommend: Regular vision checks. Poor vision and conditions such as cataracts can make you more likely to have a fall. If you wear glasses, make sure to get your prescription updated if your vision changes. Medicine review. Work with your health care provider to regularly review all of the medicines you are taking, including over-the-counter medicines. Ask your health care provider about any side effects that may make you more likely to have a fall. Tell your health care provider if any medicines that you take make you feel dizzy or sleepy. Strength and balance checks. Your health care provider may recommend certain tests to check your strength and balance while standing, walking, or changing positions. Foot health exam. Foot pain and numbness, as well as not wearing proper footwear, can make you more likely to have a fall. Screenings, including: Osteoporosis screening. Osteoporosis is a condition  that causes the bones to get weaker and break more easily. Blood pressure screening. Blood pressure changes and medicines to control blood pressure can make you feel dizzy. Depression screening. You may be more likely to have a fall if you have a fear of falling, feel depressed, or feel unable to do activities that you used to do. Alcohol use screening. Using too much alcohol can affect your balance and may make you more likely to have a fall. Follow these instructions at home: Lifestyle Do not drink alcohol if: Your health care provider tells you not to drink. If you drink alcohol: Limit how much you have to: 0-1 drink a day for women. 0-2 drinks a day for men. Know how much alcohol is in your drink. In the [place] one drink equals one 12 oz bottle of beer (355 mL), one 5 oz glass of wine (148 mL), or one 1 oz glass of hard liquor (44 mL). Do not use any products that contain nicotine or tobacco. These products include cigarettes, chewing tobacco, and vaping devices, such as e-cigarettes. If you need help quitting, ask your health care provider. Activity  Follow a regular exercise program to stay fit. This will help you maintain your balance. Ask your health care provider what types of exercise are  appropriate for you. If you need a cane or walker, use it as recommended by your health care provider. Wear supportive shoes that have nonskid soles. Safety  Remove any tripping hazards, such as rugs, cords, and clutter. Install safety equipment such as grab bars in bathrooms and safety rails on stairs. Keep rooms and walkways well-lit. General instructions Talk with your health care provider about your risks for falling. Tell your health care provider if: You fall. Be sure to tell your health care provider about all falls, even ones that seem minor. You feel dizzy, tiredness (fatigue), or off-balance. Take over-the-counter and prescription medicines only as told by your health care provider.  These include supplements. Eat a healthy diet and maintain a healthy weight. A healthy diet includes low-fat dairy products, low-fat (lean) meats, and fiber from whole grains, beans, and lots of fruits and vegetables. Stay current with your vaccines. Schedule regular health, dental, and eye exams. Summary Having a healthy lifestyle and getting preventive care can help to protect your health and wellness after age 17. Screening and testing are the best way to find a health problem early and help you avoid having a fall. Early diagnosis and treatment give you the best chance for managing medical conditions that are more common for people who are older than age 80. Falls are a major cause of broken bones and head injuries in people who are older than age 88. Take precautions to prevent a fall at home. Work with your health care provider to learn what changes you can make to improve your health and wellness and to prevent falls. This information is not intended to replace advice given to you by your health care provider. Make sure you discuss any questions you have with your health care provider. Document Revised: 05/09/2020 Document Reviewed: 05/09/2020 Elsevier Patient Education  [place], MD [facility]

## 2021-10-30 ENCOUNTER — Other Ambulatory Visit: Payer: Self-pay | Admitting: Emergency Medicine

## 2021-10-31 DIAGNOSIS — S42402D Unspecified fracture of lower end of left humerus, subsequent encounter for fracture with routine healing: Secondary | ICD-10-CM | POA: Diagnosis not present

## 2021-10-31 DIAGNOSIS — M25512 Pain in left shoulder: Secondary | ICD-10-CM | POA: Diagnosis not present

## 2021-11-02 DIAGNOSIS — S42402D Unspecified fracture of lower end of left humerus, subsequent encounter for fracture with routine healing: Secondary | ICD-10-CM | POA: Diagnosis not present

## 2021-11-02 DIAGNOSIS — M25512 Pain in left shoulder: Secondary | ICD-10-CM | POA: Diagnosis not present

## 2021-11-06 ENCOUNTER — Ambulatory Visit (INDEPENDENT_AMBULATORY_CARE_PROVIDER_SITE_OTHER): Payer: Medicare HMO

## 2021-11-06 DIAGNOSIS — I63321 Cerebral infarction due to thrombosis of right anterior cerebral artery: Secondary | ICD-10-CM | POA: Diagnosis not present

## 2021-11-06 LAB — CUP PACEART REMOTE DEVICE CHECK
Date Time Interrogation Session: 20231105231210
Implantable Pulse Generator Implant Date: 20211020

## 2021-11-07 DIAGNOSIS — S42402D Unspecified fracture of lower end of left humerus, subsequent encounter for fracture with routine healing: Secondary | ICD-10-CM | POA: Diagnosis not present

## 2021-11-07 DIAGNOSIS — M25512 Pain in left shoulder: Secondary | ICD-10-CM | POA: Diagnosis not present

## 2021-11-08 DIAGNOSIS — M9903 Segmental and somatic dysfunction of lumbar region: Secondary | ICD-10-CM | POA: Diagnosis not present

## 2021-11-08 DIAGNOSIS — M9901 Segmental and somatic dysfunction of cervical region: Secondary | ICD-10-CM | POA: Diagnosis not present

## 2021-11-08 DIAGNOSIS — M9904 Segmental and somatic dysfunction of sacral region: Secondary | ICD-10-CM | POA: Diagnosis not present

## 2021-11-08 DIAGNOSIS — M9902 Segmental and somatic dysfunction of thoracic region: Secondary | ICD-10-CM | POA: Diagnosis not present

## 2021-11-09 DIAGNOSIS — S42402D Unspecified fracture of lower end of left humerus, subsequent encounter for fracture with routine healing: Secondary | ICD-10-CM | POA: Diagnosis not present

## 2021-11-09 DIAGNOSIS — M25512 Pain in left shoulder: Secondary | ICD-10-CM | POA: Diagnosis not present

## 2021-11-14 DIAGNOSIS — M25512 Pain in left shoulder: Secondary | ICD-10-CM | POA: Diagnosis not present

## 2021-11-14 DIAGNOSIS — S42402D Unspecified fracture of lower end of left humerus, subsequent encounter for fracture with routine healing: Secondary | ICD-10-CM | POA: Diagnosis not present

## 2021-11-16 DIAGNOSIS — S42402D Unspecified fracture of lower end of left humerus, subsequent encounter for fracture with routine healing: Secondary | ICD-10-CM | POA: Diagnosis not present

## 2021-11-16 DIAGNOSIS — M25512 Pain in left shoulder: Secondary | ICD-10-CM | POA: Diagnosis not present

## 2021-11-20 DIAGNOSIS — M25512 Pain in left shoulder: Secondary | ICD-10-CM | POA: Diagnosis not present

## 2021-11-20 DIAGNOSIS — S42402D Unspecified fracture of lower end of left humerus, subsequent encounter for fracture with routine healing: Secondary | ICD-10-CM | POA: Diagnosis not present

## 2021-11-22 DIAGNOSIS — S42402D Unspecified fracture of lower end of left humerus, subsequent encounter for fracture with routine healing: Secondary | ICD-10-CM | POA: Diagnosis not present

## 2021-11-22 DIAGNOSIS — M25512 Pain in left shoulder: Secondary | ICD-10-CM | POA: Diagnosis not present

## 2021-11-28 DIAGNOSIS — S42402D Unspecified fracture of lower end of left humerus, subsequent encounter for fracture with routine healing: Secondary | ICD-10-CM | POA: Diagnosis not present

## 2021-11-28 DIAGNOSIS — M25512 Pain in left shoulder: Secondary | ICD-10-CM | POA: Diagnosis not present

## 2021-11-30 DIAGNOSIS — M25512 Pain in left shoulder: Secondary | ICD-10-CM | POA: Diagnosis not present

## 2021-11-30 DIAGNOSIS — S42402D Unspecified fracture of lower end of left humerus, subsequent encounter for fracture with routine healing: Secondary | ICD-10-CM | POA: Diagnosis not present

## 2021-12-04 NOTE — Progress Notes (Signed)
Carelink Summary Report / Loop Recorder 

## 2021-12-05 DIAGNOSIS — M25512 Pain in left shoulder: Secondary | ICD-10-CM | POA: Diagnosis not present

## 2021-12-05 DIAGNOSIS — S42402D Unspecified fracture of lower end of left humerus, subsequent encounter for fracture with routine healing: Secondary | ICD-10-CM | POA: Diagnosis not present

## 2021-12-07 DIAGNOSIS — M25512 Pain in left shoulder: Secondary | ICD-10-CM | POA: Diagnosis not present

## 2021-12-07 DIAGNOSIS — S42402D Unspecified fracture of lower end of left humerus, subsequent encounter for fracture with routine healing: Secondary | ICD-10-CM | POA: Diagnosis not present

## 2021-12-11 ENCOUNTER — Ambulatory Visit: Payer: Medicare HMO | Attending: Cardiology

## 2021-12-11 DIAGNOSIS — I63321 Cerebral infarction due to thrombosis of right anterior cerebral artery: Secondary | ICD-10-CM

## 2021-12-11 LAB — CUP PACEART REMOTE DEVICE CHECK
Date Time Interrogation Session: 20231210232030
Implantable Pulse Generator Implant Date: 20211020

## 2021-12-12 DIAGNOSIS — S42402D Unspecified fracture of lower end of left humerus, subsequent encounter for fracture with routine healing: Secondary | ICD-10-CM | POA: Diagnosis not present

## 2021-12-12 DIAGNOSIS — S42492D Other displaced fracture of lower end of left humerus, subsequent encounter for fracture with routine healing: Secondary | ICD-10-CM | POA: Diagnosis not present

## 2021-12-12 DIAGNOSIS — M25512 Pain in left shoulder: Secondary | ICD-10-CM | POA: Diagnosis not present

## 2021-12-13 DIAGNOSIS — M9904 Segmental and somatic dysfunction of sacral region: Secondary | ICD-10-CM | POA: Diagnosis not present

## 2021-12-13 DIAGNOSIS — M9902 Segmental and somatic dysfunction of thoracic region: Secondary | ICD-10-CM | POA: Diagnosis not present

## 2021-12-13 DIAGNOSIS — M9901 Segmental and somatic dysfunction of cervical region: Secondary | ICD-10-CM | POA: Diagnosis not present

## 2021-12-13 DIAGNOSIS — M9903 Segmental and somatic dysfunction of lumbar region: Secondary | ICD-10-CM | POA: Diagnosis not present

## 2021-12-14 DIAGNOSIS — S42402D Unspecified fracture of lower end of left humerus, subsequent encounter for fracture with routine healing: Secondary | ICD-10-CM | POA: Diagnosis not present

## 2021-12-14 DIAGNOSIS — M25512 Pain in left shoulder: Secondary | ICD-10-CM | POA: Diagnosis not present

## 2021-12-19 DIAGNOSIS — M25512 Pain in left shoulder: Secondary | ICD-10-CM | POA: Diagnosis not present

## 2021-12-19 DIAGNOSIS — S42402D Unspecified fracture of lower end of left humerus, subsequent encounter for fracture with routine healing: Secondary | ICD-10-CM | POA: Diagnosis not present

## 2021-12-21 DIAGNOSIS — M25512 Pain in left shoulder: Secondary | ICD-10-CM | POA: Diagnosis not present

## 2021-12-21 DIAGNOSIS — S42402D Unspecified fracture of lower end of left humerus, subsequent encounter for fracture with routine healing: Secondary | ICD-10-CM | POA: Diagnosis not present

## 2021-12-26 DIAGNOSIS — M25512 Pain in left shoulder: Secondary | ICD-10-CM | POA: Diagnosis not present

## 2021-12-26 DIAGNOSIS — S42402D Unspecified fracture of lower end of left humerus, subsequent encounter for fracture with routine healing: Secondary | ICD-10-CM | POA: Diagnosis not present

## 2021-12-28 DIAGNOSIS — S42402D Unspecified fracture of lower end of left humerus, subsequent encounter for fracture with routine healing: Secondary | ICD-10-CM | POA: Diagnosis not present

## 2021-12-28 DIAGNOSIS — M25512 Pain in left shoulder: Secondary | ICD-10-CM | POA: Diagnosis not present

## 2022-01-02 DIAGNOSIS — M25512 Pain in left shoulder: Secondary | ICD-10-CM | POA: Diagnosis not present

## 2022-01-02 DIAGNOSIS — S42402D Unspecified fracture of lower end of left humerus, subsequent encounter for fracture with routine healing: Secondary | ICD-10-CM | POA: Diagnosis not present

## 2022-01-04 DIAGNOSIS — M25512 Pain in left shoulder: Secondary | ICD-10-CM | POA: Diagnosis not present

## 2022-01-04 DIAGNOSIS — S42402D Unspecified fracture of lower end of left humerus, subsequent encounter for fracture with routine healing: Secondary | ICD-10-CM | POA: Diagnosis not present

## 2022-01-09 DIAGNOSIS — M25512 Pain in left shoulder: Secondary | ICD-10-CM | POA: Diagnosis not present

## 2022-01-09 DIAGNOSIS — S42402D Unspecified fracture of lower end of left humerus, subsequent encounter for fracture with routine healing: Secondary | ICD-10-CM | POA: Diagnosis not present

## 2022-01-11 DIAGNOSIS — M25512 Pain in left shoulder: Secondary | ICD-10-CM | POA: Diagnosis not present

## 2022-01-11 DIAGNOSIS — S42402D Unspecified fracture of lower end of left humerus, subsequent encounter for fracture with routine healing: Secondary | ICD-10-CM | POA: Diagnosis not present

## 2022-01-12 NOTE — Progress Notes (Signed)
Carelink Summary Report / Loop Recorder

## 2022-01-15 ENCOUNTER — Ambulatory Visit (INDEPENDENT_AMBULATORY_CARE_PROVIDER_SITE_OTHER): Payer: Medicare HMO

## 2022-01-15 DIAGNOSIS — I63321 Cerebral infarction due to thrombosis of right anterior cerebral artery: Secondary | ICD-10-CM | POA: Diagnosis not present

## 2022-01-15 DIAGNOSIS — S42402D Unspecified fracture of lower end of left humerus, subsequent encounter for fracture with routine healing: Secondary | ICD-10-CM | POA: Diagnosis not present

## 2022-01-15 DIAGNOSIS — M25512 Pain in left shoulder: Secondary | ICD-10-CM | POA: Diagnosis not present

## 2022-01-16 DIAGNOSIS — M9902 Segmental and somatic dysfunction of thoracic region: Secondary | ICD-10-CM | POA: Diagnosis not present

## 2022-01-16 DIAGNOSIS — M9904 Segmental and somatic dysfunction of sacral region: Secondary | ICD-10-CM | POA: Diagnosis not present

## 2022-01-16 DIAGNOSIS — M9903 Segmental and somatic dysfunction of lumbar region: Secondary | ICD-10-CM | POA: Diagnosis not present

## 2022-01-16 DIAGNOSIS — M9901 Segmental and somatic dysfunction of cervical region: Secondary | ICD-10-CM | POA: Diagnosis not present

## 2022-01-16 LAB — CUP PACEART REMOTE DEVICE CHECK
Date Time Interrogation Session: 20240112231009
Implantable Pulse Generator Implant Date: 20211020

## 2022-01-17 DIAGNOSIS — S42402D Unspecified fracture of lower end of left humerus, subsequent encounter for fracture with routine healing: Secondary | ICD-10-CM | POA: Diagnosis not present

## 2022-01-17 DIAGNOSIS — M25512 Pain in left shoulder: Secondary | ICD-10-CM | POA: Diagnosis not present

## 2022-01-23 ENCOUNTER — Encounter: Payer: Self-pay | Admitting: Emergency Medicine

## 2022-01-23 ENCOUNTER — Ambulatory Visit (INDEPENDENT_AMBULATORY_CARE_PROVIDER_SITE_OTHER): Payer: Medicare HMO | Admitting: Emergency Medicine

## 2022-01-23 VITALS — BP 128/76 | HR 71 | Temp 98.0°F | Ht 64.0 in | Wt 122.4 lb

## 2022-01-23 DIAGNOSIS — Z1231 Encounter for screening mammogram for malignant neoplasm of breast: Secondary | ICD-10-CM | POA: Diagnosis not present

## 2022-01-23 DIAGNOSIS — M25512 Pain in left shoulder: Secondary | ICD-10-CM | POA: Diagnosis not present

## 2022-01-23 DIAGNOSIS — I10 Essential (primary) hypertension: Secondary | ICD-10-CM | POA: Diagnosis not present

## 2022-01-23 DIAGNOSIS — Z8673 Personal history of transient ischemic attack (TIA), and cerebral infarction without residual deficits: Secondary | ICD-10-CM

## 2022-01-23 DIAGNOSIS — F0150 Vascular dementia without behavioral disturbance: Secondary | ICD-10-CM

## 2022-01-23 DIAGNOSIS — S42402D Unspecified fracture of lower end of left humerus, subsequent encounter for fracture with routine healing: Secondary | ICD-10-CM | POA: Diagnosis not present

## 2022-01-23 NOTE — Assessment & Plan Note (Signed)
Well-controlled hypertension. Continue losartan 25 mg daily BP Readings from Last 3 Encounters:  01/23/22 128/76  10/23/21 110/62  10/19/21 130/81

## 2022-01-23 NOTE — Patient Instructions (Signed)
Health Maintenance After Age 69 After age 69, you are at a higher risk for certain long-term diseases and infections as well as injuries from falls. Falls are a major cause of broken bones and head injuries in people who are older than age 69. Getting regular preventive care can help to keep you healthy and well. Preventive care includes getting regular testing and making lifestyle changes as recommended by your health care provider. Talk with your health care provider about: Which screenings and tests you should have. A screening is a test that checks for a disease when you have no symptoms. A diet and exercise plan that is right for you. What should I know about screenings and tests to prevent falls? Screening and testing are the best ways to find a health problem early. Early diagnosis and treatment give you the best chance of managing medical conditions that are common after age 69. Certain conditions and lifestyle choices may make you more likely to have a fall. Your health care provider may recommend: Regular vision checks. Poor vision and conditions such as cataracts can make you more likely to have a fall. If you wear glasses, make sure to get your prescription updated if your vision changes. Medicine review. Work with your health care provider to regularly review all of the medicines you are taking, including over-the-counter medicines. Ask your health care provider about any side effects that may make you more likely to have a fall. Tell your health care provider if any medicines that you take make you feel dizzy or sleepy. Strength and balance checks. Your health care provider may recommend certain tests to check your strength and balance while standing, walking, or changing positions. Foot health exam. Foot pain and numbness, as well as not wearing proper footwear, can make you more likely to have a fall. Screenings, including: Osteoporosis screening. Osteoporosis is a condition that causes  the bones to get weaker and break more easily. Blood pressure screening. Blood pressure changes and medicines to control blood pressure can make you feel dizzy. Depression screening. You may be more likely to have a fall if you have a fear of falling, feel depressed, or feel unable to do activities that you used to do. Alcohol use screening. Using too much alcohol can affect your balance and may make you more likely to have a fall. Follow these instructions at home: Lifestyle Do not drink alcohol if: Your health care provider tells you not to drink. If you drink alcohol: Limit how much you have to: 0-1 drink a day for women. 0-2 drinks a day for men. Know how much alcohol is in your drink. In the [place] one drink equals one 12 oz bottle of beer (355 mL), one 5 oz glass of wine (148 mL), or one 1 oz glass of hard liquor (44 mL). Do not use any products that contain nicotine or tobacco. These products include cigarettes, chewing tobacco, and vaping devices, such as e-cigarettes. If you need help quitting, ask your health care provider. Activity  Follow a regular exercise program to stay fit. This will help you maintain your balance. Ask your health care provider what types of exercise are appropriate for you. If you need a cane or walker, use it as recommended by your health care provider. Wear supportive shoes that have nonskid soles. Safety  Remove any tripping hazards, such as rugs, cords, and clutter. Install safety equipment such as grab bars in bathrooms and safety rails on stairs. Keep rooms and walkways   well-lit. General instructions Talk with your health care provider about your risks for falling. Tell your health care provider if: You fall. Be sure to tell your health care provider about all falls, even ones that seem minor. You feel dizzy, tiredness (fatigue), or off-balance. Take over-the-counter and prescription medicines only as told by your health care provider. These include  supplements. Eat a healthy diet and maintain a healthy weight. A healthy diet includes low-fat dairy products, low-fat (lean) meats, and fiber from whole grains, beans, and lots of fruits and vegetables. Stay current with your vaccines. Schedule regular health, dental, and eye exams. Summary Having a healthy lifestyle and getting preventive care can help to protect your health and wellness after age 69. Screening and testing are the best way to find a health problem early and help you avoid having a fall. Early diagnosis and treatment give you the best chance for managing medical conditions that are more common for people who are older than age 69. Falls are a major cause of broken bones and head injuries in people who are older than age 69. Take precautions to prevent a fall at home. Work with your health care provider to learn what changes you can make to improve your health and wellness and to prevent falls. This information is not intended to replace advice given to you by your health care provider. Make sure you discuss any questions you have with your health care provider. Document Revised: 05/09/2020 Document Reviewed: 05/09/2020 Elsevier Patient Education  2023 Elsevier Inc.  

## 2022-01-23 NOTE — Assessment & Plan Note (Signed)
Stable.  Secondary stroke prevention discussed. Maintains good blood pressure. Does not want to take cholesterol medication. Not diabetic. Continues to take daily baby aspirin and Brilinta 90 mg twice a day Exercising regularly and eating well. Staying well-hydrated. Balance issues and right hand diminished dexterity residual stroke deficits.  Slowly improving. No new concerns identified today. Will follow-up in 3 months.

## 2022-01-23 NOTE — Progress Notes (Signed)
Jacqueline Bonilla 67 y.o.   Chief Complaint  Patient presents with   Follow-up    36mth f/u appt, left foot pain on the side, toe pain     HISTORY OF PRESENT ILLNESS: This is a 67y.o. female here for 330-monthollow-up.  Accompanied by husband [place]Park Forest VillageHistory of strokes.  Going through physical therapy treatments. Improving.  Still however complains of occasional balance issues. Eating better.  Gaining weight.  Exercising regularly. Overall much improved. Also complaining of occasional pain to tip of both big toes.  Most likely related to increased physical activity and exercises. No other complaints or medical concerns today. BP Readings from Last 3 Encounters:  01/23/22 128/76  10/23/21 110/62  10/19/21 130/81   Wt Readings from Last 3 Encounters:  01/23/22 122 lb 6 oz (55.5 kg)  10/23/21 118 lb 6 oz (53.7 kg)  10/19/21 119 lb 14.9 oz (54.4 kg)     HPI   Prior to Admission medications   Medication Sig Start Date End Date Taking? Authorizing Provider  Amino Acids Complex TABS Take 1 tablet by mouth daily.   Yes [provider]  aspirin EC 81 MG tablet Take 81 mg by mouth daily. Swallow whole.   Yes [provider]  BRILINTA 90 MG TABS tablet TAKE 1 TABLET(90 MG) BY MOUTH TWICE DAILY 08/20/21  Yes Rosiland Sen, MiInes BloomerMD  CINNAMON PO Take 1 capsule by mouth daily.   Yes [provider]  Coenzyme Q10 (CO Q 10 PO) Take 1 capsule by mouth daily.   Yes [provider]  losartan (COZAAR) 25 MG tablet TAKE 1/2 TABLET BY MOUTH DAILY 10/30/21  Yes Tomorrow Dehaas, MiInes BloomerMD  MAGNESIUM PO Take 1 tablet by mouth at bedtime.   Yes [provider]  Multiple Vitamin (MULTIVITAMIN WITH MINERALS) TABS tablet Take 1 tablet by mouth daily.   Yes [provider]  Multiple Vitamins-Minerals (ZINC PO) Take 1 tablet by mouth daily.   Yes [provider]    Allergies  Allergen Reactions   Avelox [Moxifloxacin]    Ciprofloxacin  Hives   Floraquin [Iodoquinol]     No cipro or others   Iohexol      Code: HIVES, Desc: HIVES S/P IVP MANY YRS AGO    Levaquin [Levofloxacin]    Plavix [Clopidogrel] Diarrhea   Quinolones    Shellfish Allergy Itching    Itching under the chin. Described by patient but not seen by husband.    Patient Active Problem List   Diagnosis Date Noted   Lower abdominal pain 10/23/2021   Ileus (HCJohnson Lane10/23/2023   Anemia due to acute blood loss 08/07/2021   Closed fracture of left elbow 07/29/2021   Change in bowel habit 01/10/2021   Internal hemorrhoids 01/10/2021   Dyslipidemia 12/29/2020   History of stroke 07/14/2020   Normocytic anemia 07/11/2020   Vascular dementia with behavior disturbance (HC[place]land Hills10/12/2019   Vascular dementia without behavioral disturbance (HC[place]enton City07/14/2021   History of COVID-19 04/22/2019   Essential hypertension 03/03/2019    Past Medical History:  Diagnosis Date   Anemia    Anxiety    Depression    Dysmenorrhea    Endometriosis    Fibroid    Hypertension    Implantable loop recorder present 2021   Microhematuria    negative workup   Osteoporosis    Tachycardia    Thrombocytopenia (HCPhillips[place]    Past Surgical History:  Procedure Laterality Date   BREAST BIOPSY  BUBBLE STUDY  12/08/2020   Procedure: BUBBLE STUDY;  Surgeon: Josue Hector, MD;  Location: [facility] ENDOSCOPY;  Service: Cardiovascular;;   CESAREAN SECTION     hysteroscopic resection     implantable loop recorder implant  10/21/2019   Medtronic Reveal Linq model LNQ 22 ([place] [id number]) implantable loop recorder   IR CT HEAD LTD  12/01/2020   IR PERCUTANEOUS ART THROMBECTOMY/INFUSION INTRACRANIAL INC DIAG ANGIO  12/01/2020   IR US GUIDE VASC ACCESS RIGHT  12/01/2020   ORIF HUMERUS FRACTURE Left 07/31/2021   Procedure: OPEN REDUCTION INTERNAL FIXATION (ORIF) DISTAL HUMERUS FRACTURE;  Surgeon: Shona Needles, MD;  Location: [place];  Service: Orthopedics;  Laterality: Left;   RADIOLOGY WITH  ANESTHESIA N/A 12/01/2020   Procedure: IR WITH ANESTHESIA - CODE STROKE;  Surgeon: Radiologist, Medication, MD;  Location: [place];  Service: Radiology;  Laterality: N/A;   TEE WITHOUT CARDIOVERSION N/A 12/08/2020   Procedure: TRANSESOPHAGEAL ECHOCARDIOGRAM (TEE);  Surgeon: Josue Hector, MD;  Location: [facility] ENDOSCOPY;  Service: Cardiovascular;  Laterality: N/A;    Social History   Socioeconomic History   Marital status: Married    Spouse name: Psychologist, prison and probation services   Number of children: 1   Years of education: Not on file   Highest education level: Not on file  Occupational History   Occupation: accounting  Tobacco Use   Smoking status: Never   Smokeless tobacco: Never  Vaping Use   Vaping Use: Never used  Substance and Sexual Activity   Alcohol use: Never   Drug use: Never   Sexual activity: Not Currently    Partners: Male    Comment: husband vasectomy  Other Topics Concern   Not on file  Social History Narrative   Lives with husband   Grandchildren - 1   Works - Investment banker, 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%   Gun in home - yes - secured      Right handed   Caffeine: maybe tea every now and then   Social Determinants of Radio broadcast assistant Strain: Low Risk  (08/28/2021)   Overall Financial Resource Strain (CARDIA)    Difficulty of Paying Living Expenses: Not hard at all  Food Insecurity: No Food Insecurity (08/28/2021)   Hunger Vital Sign    Worried About Running Out of Food in the Last Year: Never true    [place] in the Last Year: Never true  Transportation Needs: No Transportation Needs (08/28/2021)   PRAPARE - Hydrologist (Medical): No    Lack of Transportation (Non-Medical): No  Physical Activity: Sufficiently Active (08/28/2021)   Exercise Vital Sign    Days of Exercise per Week: 4 days    Minutes of Exercise per Session: 60 min  Stress: No Stress Concern Present (08/28/2021)   [place]    Feeling of Stress : Not at all  Social Connections: Moderately Integrated (08/28/2021)   Social Connection and Isolation Panel [NHANES]    Frequency of Communication with Friends and Family: More than three times a week    Frequency of Social Gatherings with Friends and Family: Never    Attends Religious Services: More than 4 times per year    Active Member of [employer] or Organizations: No    Attends Archivist Meetings: Never    Marital Status: Married  Human resources officer Violence: Not At Risk (08/28/2021)   Humiliation, Afraid, Rape, and  Kick questionnaire    Fear of Current or Ex-Partner: No    Emotionally Abused: No    Physically Abused: No    Sexually Abused: No    Family History  Problem Relation Age of Onset   Cancer Father        non hodgkin lymphoma & skin   Heart attack Maternal Grandfather    Dementia Mother    Polymyositis Sister      Review of Systems  Constitutional: Negative.  Negative for chills and fever.  HENT: Negative.  Negative for congestion and sore throat.   Respiratory: Negative.  Negative for cough and shortness of breath.   Cardiovascular: Negative.  Negative for chest pain and palpitations.  Genitourinary: Negative.  Negative for dysuria and hematuria.  Skin: Negative.  Negative for rash.  Neurological: Negative.  Negative for dizziness and headaches.  All other systems reviewed and are negative.  Today's Vitals   01/23/22 1403  BP: 128/76  Pulse: 71  Temp: 98 F (36.7 C)  TempSrc: Oral  SpO2: 99%  Weight: 122 lb 6 oz (55.5 kg)  Height: '5\' 4"'$  (1.626 m)   Body mass index is 21.01 kg/m.   Physical Exam Vitals reviewed.  Constitutional:      Appearance: Normal appearance.  HENT:     Head: Normocephalic.     Mouth/Throat:     Mouth: Mucous membranes are moist.     Pharynx: Oropharynx is clear.  Eyes:     Extraocular Movements: Extraocular movements intact.     Pupils: Pupils are equal, round, and reactive to  light.  Cardiovascular:     Rate and Rhythm: Normal rate and regular rhythm.     Pulses: Normal pulses.     Heart sounds: Normal heart sounds.  Pulmonary:     Effort: Pulmonary effort is normal.     Breath sounds: Normal breath sounds.  Abdominal:     Palpations: Abdomen is soft.     Tenderness: There is no abdominal tenderness.  Musculoskeletal:     Cervical back: No tenderness.     Right lower leg: No edema.     Left lower leg: No edema.     Comments: Feet: Warm to touch.  Good distal pulses.  Good capillary refill.  Neurovascularly intact.  No skin breakdowns.  No erythema or ecchymosis.  Lymphadenopathy:     Cervical: No cervical adenopathy.  Skin:    General: Skin is warm and dry.  Neurological:     General: No focal deficit present.     Mental Status: She is alert and oriented to person, place, and time.  Psychiatric:        Mood and Affect: Mood normal.        Behavior: Behavior normal.      ASSESSMENT & PLAN: A total of 47 minutes was spent with the patient and counseling/coordination of care regarding preparing for this visit, review of most recent office visit notes, review of multiple chronic medical conditions and their management, review of all medications, review of most recent blood work results, education on nutrition, prognosis, documentation, and need for follow-up in 3 months..  Problem List Items Addressed This Visit       Cardiovascular and Mediastinum   Essential hypertension - Primary    Well-controlled hypertension. Continue losartan 25 mg daily BP Readings from Last 3 Encounters:  01/23/22 128/76  10/23/21 110/62  10/19/21 130/81          Nervous and Auditory   Vascular  dementia without behavioral disturbance (HCC)    Stable.  No concerns.        Other   History of stroke    Stable.  Secondary stroke prevention discussed. Maintains good blood pressure. Does not want to take cholesterol medication. Not diabetic. Continues to take  daily baby aspirin and Brilinta 90 mg twice a day Exercising regularly and eating well. Staying well-hydrated. Balance issues and right hand diminished dexterity residual stroke deficits.  Slowly improving. No new concerns identified today. Will follow-up in 3 months.      Other Visit Diagnoses     Encounter for screening mammogram for malignant neoplasm of breast       Relevant Orders   MM Digital Screening      Patient Instructions  Health Maintenance After Age 67 After age 94, you are at a higher risk for certain long-term diseases and infections as well as injuries from falls. Falls are a major cause of broken bones and head injuries in people who are older than age 9. Getting regular preventive care can help to keep you healthy and well. Preventive care includes getting regular testing and making lifestyle changes as recommended by your health care provider. Talk with your health care provider about: Which screenings and tests you should have. A screening is a test that checks for a disease when you have no symptoms. A diet and exercise plan that is right for you. What should I know about screenings and tests to prevent falls? Screening and testing are the best ways to find a health problem early. Early diagnosis and treatment give you the best chance of managing medical conditions that are common after age 94. Certain conditions and lifestyle choices may make you more likely to have a fall. Your health care provider may recommend: Regular vision checks. Poor vision and conditions such as cataracts can make you more likely to have a fall. If you wear glasses, make sure to get your prescription updated if your vision changes. Medicine review. Work with your health care provider to regularly review all of the medicines you are taking, including over-the-counter medicines. Ask your health care provider about any side effects that may make you more likely to have a fall. Tell your health  care provider if any medicines that you take make you feel dizzy or sleepy. Strength and balance checks. Your health care provider may recommend certain tests to check your strength and balance while standing, walking, or changing positions. Foot health exam. Foot pain and numbness, as well as not wearing proper footwear, can make you more likely to have a fall. Screenings, including: Osteoporosis screening. Osteoporosis is a condition that causes the bones to get weaker and break more easily. Blood pressure screening. Blood pressure changes and medicines to control blood pressure can make you feel dizzy. Depression screening. You may be more likely to have a fall if you have a fear of falling, feel depressed, or feel unable to do activities that you used to do. Alcohol use screening. Using too much alcohol can affect your balance and may make you more likely to have a fall. Follow these instructions at home: Lifestyle Do not drink alcohol if: Your health care provider tells you not to drink. If you drink alcohol: Limit how much you have to: 0-1 drink a day for women. 0-2 drinks a day for men. Know how much alcohol is in your drink. In the [place] one drink equals one 12 oz bottle of beer (355 mL),  one 5 oz glass of wine (148 mL), or one 1 oz glass of hard liquor (44 mL). Do not use any products that contain nicotine or tobacco. These products include cigarettes, chewing tobacco, and vaping devices, such as e-cigarettes. If you need help quitting, ask your health care provider. Activity  Follow a regular exercise program to stay fit. This will help you maintain your balance. Ask your health care provider what types of exercise are appropriate for you. If you need a cane or walker, use it as recommended by your health care provider. Wear supportive shoes that have nonskid soles. Safety  Remove any tripping hazards, such as rugs, cords, and clutter. Install safety equipment such as grab bars  in bathrooms and safety rails on stairs. Keep rooms and walkways well-lit. General instructions Talk with your health care provider about your risks for falling. Tell your health care provider if: You fall. Be sure to tell your health care provider about all falls, even ones that seem minor. You feel dizzy, tiredness (fatigue), or off-balance. Take over-the-counter and prescription medicines only as told by your health care provider. These include supplements. Eat a healthy diet and maintain a healthy weight. A healthy diet includes low-fat dairy products, low-fat (lean) meats, and fiber from whole grains, beans, and lots of fruits and vegetables. Stay current with your vaccines. Schedule regular health, dental, and eye exams. Summary Having a healthy lifestyle and getting preventive care can help to protect your health and wellness after age 13. Screening and testing are the best way to find a health problem early and help you avoid having a fall. Early diagnosis and treatment give you the best chance for managing medical conditions that are more common for people who are older than age 59. Falls are a major cause of broken bones and head injuries in people who are older than age 72. Take precautions to prevent a fall at home. Work with your health care provider to learn what changes you can make to improve your health and wellness and to prevent falls. This information is not intended to replace advice given to you by your health care provider. Make sure you discuss any questions you have with your health care provider. Document Revised: 05/09/2020 Document Reviewed: 05/09/2020 Elsevier Patient Education  [place], MD [facility]

## 2022-01-23 NOTE — Assessment & Plan Note (Signed)
Stable.  No concerns. °

## 2022-01-25 DIAGNOSIS — M25512 Pain in left shoulder: Secondary | ICD-10-CM | POA: Diagnosis not present

## 2022-01-25 DIAGNOSIS — S42402D Unspecified fracture of lower end of left humerus, subsequent encounter for fracture with routine healing: Secondary | ICD-10-CM | POA: Diagnosis not present

## 2022-01-29 DIAGNOSIS — S42402D Unspecified fracture of lower end of left humerus, subsequent encounter for fracture with routine healing: Secondary | ICD-10-CM | POA: Diagnosis not present

## 2022-01-29 DIAGNOSIS — M25512 Pain in left shoulder: Secondary | ICD-10-CM | POA: Diagnosis not present

## 2022-02-01 DIAGNOSIS — M25512 Pain in left shoulder: Secondary | ICD-10-CM | POA: Diagnosis not present

## 2022-02-01 DIAGNOSIS — S42402D Unspecified fracture of lower end of left humerus, subsequent encounter for fracture with routine healing: Secondary | ICD-10-CM | POA: Diagnosis not present

## 2022-02-06 DIAGNOSIS — S42402D Unspecified fracture of lower end of left humerus, subsequent encounter for fracture with routine healing: Secondary | ICD-10-CM | POA: Diagnosis not present

## 2022-02-06 DIAGNOSIS — M25512 Pain in left shoulder: Secondary | ICD-10-CM | POA: Diagnosis not present

## 2022-02-08 DIAGNOSIS — M25512 Pain in left shoulder: Secondary | ICD-10-CM | POA: Diagnosis not present

## 2022-02-08 DIAGNOSIS — S42402D Unspecified fracture of lower end of left humerus, subsequent encounter for fracture with routine healing: Secondary | ICD-10-CM | POA: Diagnosis not present

## 2022-02-13 DIAGNOSIS — M25512 Pain in left shoulder: Secondary | ICD-10-CM | POA: Diagnosis not present

## 2022-02-13 DIAGNOSIS — S42402D Unspecified fracture of lower end of left humerus, subsequent encounter for fracture with routine healing: Secondary | ICD-10-CM | POA: Diagnosis not present

## 2022-02-15 DIAGNOSIS — S42402D Unspecified fracture of lower end of left humerus, subsequent encounter for fracture with routine healing: Secondary | ICD-10-CM | POA: Diagnosis not present

## 2022-02-15 DIAGNOSIS — M25512 Pain in left shoulder: Secondary | ICD-10-CM | POA: Diagnosis not present

## 2022-02-16 ENCOUNTER — Other Ambulatory Visit: Payer: Self-pay | Admitting: Emergency Medicine

## 2022-02-16 DIAGNOSIS — Z0000 Encounter for general adult medical examination without abnormal findings: Secondary | ICD-10-CM

## 2022-02-16 DIAGNOSIS — E2839 Other primary ovarian failure: Secondary | ICD-10-CM

## 2022-02-16 DIAGNOSIS — Z780 Asymptomatic menopausal state: Secondary | ICD-10-CM

## 2022-02-16 LAB — CUP PACEART REMOTE DEVICE CHECK
Date Time Interrogation Session: 20240214231156
Implantable Pulse Generator Implant Date: 20211020

## 2022-02-19 ENCOUNTER — Ambulatory Visit: Payer: Medicare HMO

## 2022-02-19 DIAGNOSIS — I63321 Cerebral infarction due to thrombosis of right anterior cerebral artery: Secondary | ICD-10-CM

## 2022-02-20 DIAGNOSIS — M25512 Pain in left shoulder: Secondary | ICD-10-CM | POA: Diagnosis not present

## 2022-02-20 DIAGNOSIS — S42402D Unspecified fracture of lower end of left humerus, subsequent encounter for fracture with routine healing: Secondary | ICD-10-CM | POA: Diagnosis not present

## 2022-02-21 ENCOUNTER — Ambulatory Visit
Admission: RE | Admit: 2022-02-21 | Discharge: 2022-02-21 | Disposition: A | Discharge status: Home / Self Care | Payer: Medicare HMO | Source: Ambulatory Visit | Attending: Emergency Medicine | Admitting: Emergency Medicine

## 2022-02-21 ENCOUNTER — Other Ambulatory Visit: Payer: Self-pay | Admitting: Emergency Medicine

## 2022-02-21 DIAGNOSIS — Z1231 Encounter for screening mammogram for malignant neoplasm of breast: Secondary | ICD-10-CM | POA: Diagnosis not present

## 2022-02-21 DIAGNOSIS — M810 Age-related osteoporosis without current pathological fracture: Secondary | ICD-10-CM

## 2022-02-21 DIAGNOSIS — E2839 Other primary ovarian failure: Secondary | ICD-10-CM

## 2022-02-21 DIAGNOSIS — M9902 Segmental and somatic dysfunction of thoracic region: Secondary | ICD-10-CM | POA: Diagnosis not present

## 2022-02-21 DIAGNOSIS — M9904 Segmental and somatic dysfunction of sacral region: Secondary | ICD-10-CM | POA: Diagnosis not present

## 2022-02-21 DIAGNOSIS — M9903 Segmental and somatic dysfunction of lumbar region: Secondary | ICD-10-CM | POA: Diagnosis not present

## 2022-02-21 DIAGNOSIS — M9901 Segmental and somatic dysfunction of cervical region: Secondary | ICD-10-CM | POA: Diagnosis not present

## 2022-02-21 DIAGNOSIS — Z780 Asymptomatic menopausal state: Secondary | ICD-10-CM | POA: Diagnosis not present

## 2022-02-21 MED ORDER — ALENDRONATE SODIUM 70 MG PO TABS: 70.0000 mg | ORAL_TABLET | ORAL | 3 refills | Status: DC

## 2022-02-22 DIAGNOSIS — M25512 Pain in left shoulder: Secondary | ICD-10-CM | POA: Diagnosis not present

## 2022-02-22 DIAGNOSIS — S42402D Unspecified fracture of lower end of left humerus, subsequent encounter for fracture with routine healing: Secondary | ICD-10-CM | POA: Diagnosis not present

## 2022-02-22 NOTE — Progress Notes (Signed)
Carelink Summary Report / Loop Recorder 

## 2022-02-27 DIAGNOSIS — S42402D Unspecified fracture of lower end of left humerus, subsequent encounter for fracture with routine healing: Secondary | ICD-10-CM | POA: Diagnosis not present

## 2022-02-27 DIAGNOSIS — M25512 Pain in left shoulder: Secondary | ICD-10-CM | POA: Diagnosis not present

## 2022-03-01 DIAGNOSIS — S42402D Unspecified fracture of lower end of left humerus, subsequent encounter for fracture with routine healing: Secondary | ICD-10-CM | POA: Diagnosis not present

## 2022-03-01 DIAGNOSIS — M25512 Pain in left shoulder: Secondary | ICD-10-CM | POA: Diagnosis not present

## 2022-03-06 DIAGNOSIS — M25512 Pain in left shoulder: Secondary | ICD-10-CM | POA: Diagnosis not present

## 2022-03-06 DIAGNOSIS — S42402D Unspecified fracture of lower end of left humerus, subsequent encounter for fracture with routine healing: Secondary | ICD-10-CM | POA: Diagnosis not present

## 2022-03-08 DIAGNOSIS — M25512 Pain in left shoulder: Secondary | ICD-10-CM | POA: Diagnosis not present

## 2022-03-08 DIAGNOSIS — S42402D Unspecified fracture of lower end of left humerus, subsequent encounter for fracture with routine healing: Secondary | ICD-10-CM | POA: Diagnosis not present

## 2022-03-13 DIAGNOSIS — M25512 Pain in left shoulder: Secondary | ICD-10-CM | POA: Diagnosis not present

## 2022-03-13 DIAGNOSIS — S42402D Unspecified fracture of lower end of left humerus, subsequent encounter for fracture with routine healing: Secondary | ICD-10-CM | POA: Diagnosis not present

## 2022-03-15 DIAGNOSIS — S42402D Unspecified fracture of lower end of left humerus, subsequent encounter for fracture with routine healing: Secondary | ICD-10-CM | POA: Diagnosis not present

## 2022-03-15 DIAGNOSIS — M25512 Pain in left shoulder: Secondary | ICD-10-CM | POA: Diagnosis not present

## 2022-03-20 DIAGNOSIS — S42402D Unspecified fracture of lower end of left humerus, subsequent encounter for fracture with routine healing: Secondary | ICD-10-CM | POA: Diagnosis not present

## 2022-03-20 DIAGNOSIS — M25512 Pain in left shoulder: Secondary | ICD-10-CM | POA: Diagnosis not present

## 2022-03-22 DIAGNOSIS — S42402D Unspecified fracture of lower end of left humerus, subsequent encounter for fracture with routine healing: Secondary | ICD-10-CM | POA: Diagnosis not present

## 2022-03-22 DIAGNOSIS — M25512 Pain in left shoulder: Secondary | ICD-10-CM | POA: Diagnosis not present

## 2022-03-23 LAB — CUP PACEART REMOTE DEVICE CHECK
Date Time Interrogation Session: 20240318230958
Implantable Pulse Generator Implant Date: 20211020

## 2022-03-26 ENCOUNTER — Ambulatory Visit (INDEPENDENT_AMBULATORY_CARE_PROVIDER_SITE_OTHER): Payer: Medicare HMO

## 2022-03-26 DIAGNOSIS — I63321 Cerebral infarction due to thrombosis of right anterior cerebral artery: Secondary | ICD-10-CM | POA: Diagnosis not present

## 2022-03-27 DIAGNOSIS — M25512 Pain in left shoulder: Secondary | ICD-10-CM | POA: Diagnosis not present

## 2022-03-27 DIAGNOSIS — S42402D Unspecified fracture of lower end of left humerus, subsequent encounter for fracture with routine healing: Secondary | ICD-10-CM | POA: Diagnosis not present

## 2022-03-29 DIAGNOSIS — S42402D Unspecified fracture of lower end of left humerus, subsequent encounter for fracture with routine healing: Secondary | ICD-10-CM | POA: Diagnosis not present

## 2022-03-29 DIAGNOSIS — M25512 Pain in left shoulder: Secondary | ICD-10-CM | POA: Diagnosis not present

## 2022-04-02 NOTE — Progress Notes (Signed)
Carelink Summary Report / Loop Recorder 

## 2022-04-03 DIAGNOSIS — S42402D Unspecified fracture of lower end of left humerus, subsequent encounter for fracture with routine healing: Secondary | ICD-10-CM | POA: Diagnosis not present

## 2022-04-03 DIAGNOSIS — M25512 Pain in left shoulder: Secondary | ICD-10-CM | POA: Diagnosis not present

## 2022-04-05 DIAGNOSIS — S42402D Unspecified fracture of lower end of left humerus, subsequent encounter for fracture with routine healing: Secondary | ICD-10-CM | POA: Diagnosis not present

## 2022-04-05 DIAGNOSIS — M25512 Pain in left shoulder: Secondary | ICD-10-CM | POA: Diagnosis not present

## 2022-04-06 ENCOUNTER — Encounter (HOSPITAL_BASED_OUTPATIENT_CLINIC_OR_DEPARTMENT_OTHER): Payer: Self-pay | Admitting: Emergency Medicine

## 2022-04-06 ENCOUNTER — Emergency Department (HOSPITAL_BASED_OUTPATIENT_CLINIC_OR_DEPARTMENT_OTHER): Payer: Medicare HMO

## 2022-04-06 ENCOUNTER — Emergency Department (HOSPITAL_BASED_OUTPATIENT_CLINIC_OR_DEPARTMENT_OTHER)
Admission: EM | Admit: 2022-04-06 | Discharge: 2022-04-06 | Disposition: A | Discharge status: Home / Self Care | Payer: Medicare HMO | Attending: Emergency Medicine | Admitting: Emergency Medicine

## 2022-04-06 ENCOUNTER — Other Ambulatory Visit: Payer: Self-pay

## 2022-04-06 DIAGNOSIS — Y9301 Activity, walking, marching and hiking: Secondary | ICD-10-CM | POA: Insufficient documentation

## 2022-04-06 DIAGNOSIS — F0390 Unspecified dementia without behavioral disturbance: Secondary | ICD-10-CM | POA: Insufficient documentation

## 2022-04-06 DIAGNOSIS — W19XXXA Unspecified fall, initial encounter: Secondary | ICD-10-CM | POA: Diagnosis not present

## 2022-04-06 DIAGNOSIS — S52571A Other intraarticular fracture of lower end of right radius, initial encounter for closed fracture: Secondary | ICD-10-CM | POA: Diagnosis not present

## 2022-04-06 DIAGNOSIS — Z7982 Long term (current) use of aspirin: Secondary | ICD-10-CM | POA: Diagnosis not present

## 2022-04-06 DIAGNOSIS — S52531A Colles' fracture of right radius, initial encounter for closed fracture: Secondary | ICD-10-CM | POA: Insufficient documentation

## 2022-04-06 DIAGNOSIS — S52611A Displaced fracture of right ulna styloid process, initial encounter for closed fracture: Secondary | ICD-10-CM | POA: Insufficient documentation

## 2022-04-06 DIAGNOSIS — S59911A Unspecified injury of right forearm, initial encounter: Secondary | ICD-10-CM | POA: Diagnosis present

## 2022-04-06 MED ORDER — OXYCODONE-ACETAMINOPHEN 5-325 MG PO TABS
1.0000 | ORAL_TABLET | Freq: Once | ORAL | Status: DC
  Filled 2022-04-06: qty 1

## 2022-04-06 MED ORDER — HYDROCODONE-ACETAMINOPHEN 5-325 MG PO TABS: 1.0000 | ORAL_TABLET | Freq: Four times a day (QID) | ORAL | 0 refills | Status: DC | PRN

## 2022-04-06 NOTE — ED Provider Notes (Signed)
[place] [facility]   CSN: [id number] Arrival date & time: 04/06/22  1850     History  Chief Complaint  Patient presents with   Wrist Pain    Jacqueline FryDeborah P Vessell is a 67 y.o. female, hx of vascular dementia on Brillinta and aspirin, who presents to the ED for R wrist pain after a fall today. States she was walking very fast around the neighborhood and lost her balance and fell onto her hand. She states since then her hand has been very painful.     Home Medications Prior to Admission medications   Medication Sig Start Date End Date Taking? Authorizing Provider  HYDROcodone-acetaminophen (NORCO) 5-325 MG tablet Take 1 tablet by mouth every 6 (six) hours as needed for moderate pain. 04/06/22  Yes Laraya Pestka L, PA  alendronate (FOSAMAX) 70 MG tablet Take 1 tablet (70 mg total) by mouth every 7 (seven) days. Take with a full glass of water on an empty stomach. 02/21/22   Georgina QuintSagardia, Miguel Jose, MD  Amino Acids Complex TABS Take 1 tablet by mouth daily.    [provider]  aspirin EC 81 MG tablet Take 81 mg by mouth daily. Swallow whole.    [provider]  BRILINTA 90 MG TABS tablet TAKE 1 TABLET(90 MG) BY MOUTH TWICE DAILY 08/20/21   Georgina QuintSagardia, Miguel Jose, MD  CINNAMON PO Take 1 capsule by mouth daily.    [provider]  Coenzyme Q10 (CO Q 10 PO) Take 1 capsule by mouth daily.    [provider]  losartan (COZAAR) 25 MG tablet TAKE 1/2 TABLET BY MOUTH DAILY 10/30/21   Georgina QuintSagardia, Miguel Jose, MD  MAGNESIUM PO Take 1 tablet by mouth at bedtime.    [provider]  Multiple Vitamin (MULTIVITAMIN WITH MINERALS) TABS tablet Take 1 tablet by mouth daily.    [provider]  Multiple Vitamins-Minerals (ZINC PO) Take 1 tablet by mouth daily.    [provider]      Allergies    Avelox [moxifloxacin], Ciprofloxacin, Floraquin [iodoquinol], Iohexol, Levaquin [levofloxacin], Plavix  [clopidogrel], Quinolones, and Shellfish allergy    Review of Systems   Review of Systems  Musculoskeletal:        +R wrist pain  Skin:  Negative for wound.    Physical Exam Updated Vital Signs BP (!) 166/100 (BP Location: Right Arm)   Pulse 72   Temp 98.5 F (36.9 C) (Oral)   Resp 18   LMP 01/02/2003   SpO2 99%  Physical Exam Vitals and nursing note reviewed.  Constitutional:      General: She is not in acute distress.    Appearance: She is well-developed.  HENT:     Head: Normocephalic and atraumatic.  Eyes:     General:        Right eye: No discharge.        Left eye: No discharge.     Conjunctiva/sclera: Conjunctivae normal.  Pulmonary:     Effort: No respiratory distress.  Musculoskeletal:     Comments: Right hand: TTP of right wrist w/dinner fork deformity Radial pulses present. Grip strength intact. Able to flex, extend, ulnar and radial deviate wrist. Two point discrimination intact. Normal thumb opposition. Intact ROM for all MCPs, PIPs, and DIPs.  No snuffbox ttp. No sensory deficits. Capillary refill <2sec. +radial pulse  Neurological:     Mental Status: She is alert.     Comments: Clear speech. Neurologically intact.  Psychiatric:        Behavior: Behavior normal.        Thought Content: Thought content normal.     ED Results / Procedures / Treatments   Labs (all labs ordered are listed, but only abnormal results are displayed) Labs Reviewed - No data to display  EKG None  Radiology CT Wrist Right Wo Contrast  Result Date: 04/06/2022 CLINICAL DATA:  Distal radial and ulnar fractures EXAM: CT OF THE RIGHT WRIST WITHOUT CONTRAST TECHNIQUE: Multidetector CT imaging of the right wrist was performed according to the standard protocol. Multiplanar CT image reconstructions were also generated. RADIATION DOSE REDUCTION: This exam was performed according to the departmental dose-optimization program which includes automated exposure control, adjustment of the  mA and/or kV according to patient size and/or use of iterative reconstruction technique. COMPARISON:  Films from earlier in the same day. FINDINGS: Bones/Joint/Cartilage Comminuted distal radial fracture is identified involving the metaphysis with intra-articular involvement both medially and laterally. Mild impaction is noted at the fracture site. Mild posterior angulation is noted at the fracture site similar to that seen on prior plain film examination. Distal ulnar styloid fracture is noted without significant displacement. No other fractures are seen. Ligaments Suboptimally assessed by CT. Muscles and Tendons Surrounding musculature is within normal limits. Soft tissues Surrounding soft tissues show some mild swelling around the fracture site. No focal hematoma is noted. IMPRESSION: Distal radial and ulnar fracture similar to that seen on prior plain film examination. Intra articular involvement is noted with mild posterior angulation at the fracture site in the radius. Electronically Signed   By: Alcide Clever M.D.   On: 04/06/2022 21:10   DG Wrist Complete Right  Result Date: 04/06/2022 CLINICAL DATA:  Fall EXAM: RIGHT WRIST - COMPLETE 4 VIEW COMPARISON:  None Available. FINDINGS: Comminuted fracture of the distal radius with intra-articular extension. Dorsal angulation of the distal fragment. Displaced ulnar styloid process fracture. Diffuse demineralization. Soft tissue swelling of the wrist. IMPRESSION: 1. Comminuted intra-articular fracture of the distal radius with dorsal angulation of the distal fragment. 2. Displaced ulnar styloid process fracture. Electronically Signed   By: Allegra Lai M.D.   On: 04/06/2022 19:22    Procedures Procedures    Medications Ordered in ED Medications  oxyCODONE-acetaminophen (PERCOCET/ROXICET) 5-325 MG per tablet 1 tablet (has no administration in time range)    ED Course/ Medical Decision Making/ A&P                             Medical Decision  Making Patient is a 67 year old female, here for tenderness to palpation of her wrist, she fell today, and has had difficulty moving it.  She does have a dinner fork deformity on exam, and but has good capillary refill and no sensory deficits.  Will obtain an x-ray of her wrist, for further evaluation.  No elbow tenderness to palpation or other tenderness to palpation.  Did not hit head.  Amount and/or Complexity of Data Reviewed Radiology: ordered.    Details: X-ray concerning for ulnar styloid displacement of fracture, as well as Colles' fracture. Discussion of management or test interpretation with external provider(s): Spoke with Dr. Melvyn Novas, hand surgery, he states no reduction necessary. Requests CT of wrist for pre-op planning w/3D reconstruction and then to place in short arm velcro volar splint.  Placed in volar splint, and provided with sling to help with support.  Given a short dose of pain medications to  help with severe pain.  Educated husband on pain management, and use of laxatives with it to prevent constipation.  Return precautions emphasized.  Risk Prescription drug management.    Final Clinical Impression(s) / ED Diagnoses Final diagnoses:  Closed Colles' fracture of right radius, initial encounter  Closed displaced fracture of styloid process of right ulna, initial encounter    Rx / DC Orders ED Discharge Orders          Ordered    HYDROcodone-acetaminophen (NORCO) 5-325 MG tablet  Every 6 hours PRN        04/06/22 2125              Kayanna Mckillop, Harley AltoBrooke L, PA 04/06/22 2127    Mardene SayerBranham, Victoria C, MD 04/06/22 2312

## 2022-04-06 NOTE — ED Triage Notes (Signed)
Pt presents to ED POV. Pt c/o R wrist pain s/p mechanical fall. Pt reports she tripped while walking in neighborhood. Denies head injury or LOC. Pt report she takes Birlinta.

## 2022-04-06 NOTE — Discharge Instructions (Addendum)
Please follow-up with Dr. Orlan Leavens on Tuesday.  Make sure you wearing brace at all times, and follow-up with him.  If you have any loss of sensation, weakness in your hand, or loss of sensation or pulse return to ER immediately.  Take the pain medications as needed.  The pain medication has Tylenol in it so be careful with your Tylenol usage should not exceed more than 4 g a day.

## 2022-04-06 NOTE — ED Notes (Signed)
Pt. Declined oxycodone at present. Stated she wants to wait until she needs it.

## 2022-04-09 ENCOUNTER — Telehealth: Payer: Self-pay | Admitting: Emergency Medicine

## 2022-04-09 ENCOUNTER — Other Ambulatory Visit: Payer: Self-pay | Admitting: *Deleted

## 2022-04-09 DIAGNOSIS — F0150 Vascular dementia without behavioral disturbance: Secondary | ICD-10-CM

## 2022-04-09 NOTE — Telephone Encounter (Signed)
Patient's husband would like to get a Child psychotherapist involved due to her dementia.  Please call - [phone]

## 2022-04-09 NOTE — Telephone Encounter (Signed)
Called husband he states not really sure yet. Wife broke her arm this past weekend and have appt w/ orthopedic tomorrow. As of right now would like the referral for the SW. Inform we have place referral and he would get a call concerning referral../lmb

## 2022-04-09 NOTE — Telephone Encounter (Signed)
Please call and inquire what the needs are.  Get social worker to visit home and find out what needs are also.  Thanks.

## 2022-04-09 NOTE — Telephone Encounter (Signed)
Thank you :)

## 2022-04-10 ENCOUNTER — Telehealth: Payer: Self-pay

## 2022-04-10 DIAGNOSIS — M25531 Pain in right wrist: Secondary | ICD-10-CM | POA: Diagnosis not present

## 2022-04-10 NOTE — Telephone Encounter (Signed)
     Patient  visit on 04/06/2022  at [facility] was for right wrist pain.  Have you been able to follow up with your primary care physician? Patient is following up with an Orthopedic Surgeon.  The patient was or was not able to obtain any needed medicine or equipment. Patient was able to obtain medication.  Are there diet recommendations that you are having difficulty following? No  Patient expresses understanding of discharge instructions and education provided has no other needs at this time. Yes   Jacqueline [facility]illa [facility]  [facility] Population Health Community Resource Care Guide   [email or web address]  ?? [phone]   Website: [email or web address]  [email or web address]

## 2022-04-10 NOTE — Telephone Encounter (Signed)
   Telephone encounter was:  Unsuccessful.  04/10/2022 Name: Jacqueline Bonilla MRN: [id number] DOB: March 20, 1955  Unsuccessful outbound call made today to assist with:   Community resources   Outreach Attempt:  1st Attempt  Unable to leave a message   Lenard Forth [facility] Guide, [facility] [phone] [address] [place], [place], [name] [address] Phone: [phone] Email: [email or web address]

## 2022-04-11 DIAGNOSIS — S42402D Unspecified fracture of lower end of left humerus, subsequent encounter for fracture with routine healing: Secondary | ICD-10-CM | POA: Diagnosis not present

## 2022-04-11 DIAGNOSIS — M25512 Pain in left shoulder: Secondary | ICD-10-CM | POA: Diagnosis not present

## 2022-04-16 ENCOUNTER — Telehealth: Payer: Self-pay | Admitting: Emergency Medicine

## 2022-04-16 DIAGNOSIS — Z8673 Personal history of transient ischemic attack (TIA), and cerebral infarction without residual deficits: Secondary | ICD-10-CM

## 2022-04-16 DIAGNOSIS — S42402D Unspecified fracture of lower end of left humerus, subsequent encounter for fracture with routine healing: Secondary | ICD-10-CM

## 2022-04-16 NOTE — Telephone Encounter (Signed)
Patient's husband called and said patient recently broke her arm. Due to her previous strokes, that is her primary arm that she uses for stabilization. They would like to know if they can get a temporary home health nurse just to help her shower and dress. Best callback is [phone].

## 2022-04-16 NOTE — Telephone Encounter (Signed)
Do not need an OV for this.  Lets make sure we provide the help requested please.  Thanks.

## 2022-04-16 NOTE — Telephone Encounter (Signed)
Called patient husband and informed him that a referral tor [facility] was placed

## 2022-04-17 DIAGNOSIS — M25512 Pain in left shoulder: Secondary | ICD-10-CM | POA: Diagnosis not present

## 2022-04-17 DIAGNOSIS — S42402D Unspecified fracture of lower end of left humerus, subsequent encounter for fracture with routine healing: Secondary | ICD-10-CM | POA: Diagnosis not present

## 2022-04-18 ENCOUNTER — Telehealth: Payer: Self-pay

## 2022-04-18 NOTE — Telephone Encounter (Signed)
   Telephone encounter was:  Unsuccessful.  04/18/2022 Name: Jacqueline Bonilla MRN: [id number] DOB: 04-14-1955  Unsuccessful outbound call made today to assist with:   Community resources  Outreach Attempt:  2nd Attempt  A HIPAA compliant voice message was left requesting a return call.  Instructed patient to call back     Lenard Forth [facility] Guide, [facility] [phone] [address] [place], [place], [name] [address] Phone: [phone] Email: [email or web address]

## 2022-04-19 ENCOUNTER — Telehealth: Payer: Self-pay

## 2022-04-19 DIAGNOSIS — S42402D Unspecified fracture of lower end of left humerus, subsequent encounter for fracture with routine healing: Secondary | ICD-10-CM | POA: Diagnosis not present

## 2022-04-19 DIAGNOSIS — M25512 Pain in left shoulder: Secondary | ICD-10-CM | POA: Diagnosis not present

## 2022-04-19 NOTE — Telephone Encounter (Signed)
   Telephone encounter was:  Successful.  04/19/2022 Name: Jacqueline Bonilla MRN: [id number] DOB: October 27, 1955  [name] is a 67 y.o. year old female who is a primary care patient of Alvy Bimler, Eilleen Kempf, MD . The community resource team was consulted for assistance with  in home care  Care guide performed the following interventions: Patient provided with information about care guide support team and interviewed to confirm resource needs.Pt has had 4 strokes and 3 surgerys and is in need of in home care. Husband is having health issues as well and cant help the patient as much as she needs with ADLS. They will call insurance to check on the benefits and reach back out to me if needed   Follow Up Plan:  No further follow up planned at this time. The patient has been provided with needed resources.    Lenard Forth [facility] Guide, [facility] [phone] [address] [place], [place], [name] [address] Phone: [phone] Email: [email or web address]

## 2022-04-19 NOTE — Telephone Encounter (Signed)
Mailing resources   [facility] Guide, [facility] [phone] [address] [place], [place], [name] [address] Phone: [phone] Email: [email or web address]

## 2022-04-20 DIAGNOSIS — S52571D Other intraarticular fracture of lower end of right radius, subsequent encounter for closed fracture with routine healing: Secondary | ICD-10-CM | POA: Diagnosis not present

## 2022-04-23 DIAGNOSIS — S42402D Unspecified fracture of lower end of left humerus, subsequent encounter for fracture with routine healing: Secondary | ICD-10-CM | POA: Diagnosis not present

## 2022-04-23 DIAGNOSIS — M25512 Pain in left shoulder: Secondary | ICD-10-CM | POA: Diagnosis not present

## 2022-04-24 ENCOUNTER — Ambulatory Visit (INDEPENDENT_AMBULATORY_CARE_PROVIDER_SITE_OTHER): Payer: Medicare HMO | Admitting: Emergency Medicine

## 2022-04-24 ENCOUNTER — Encounter: Payer: Self-pay | Admitting: Emergency Medicine

## 2022-04-24 VITALS — BP 128/84 | HR 68 | Temp 98.3°F | Ht 64.0 in | Wt 121.0 lb

## 2022-04-24 DIAGNOSIS — E785 Hyperlipidemia, unspecified: Secondary | ICD-10-CM | POA: Diagnosis not present

## 2022-04-24 DIAGNOSIS — Z789 Other specified health status: Secondary | ICD-10-CM | POA: Diagnosis not present

## 2022-04-24 DIAGNOSIS — Z8673 Personal history of transient ischemic attack (TIA), and cerebral infarction without residual deficits: Secondary | ICD-10-CM | POA: Diagnosis not present

## 2022-04-24 DIAGNOSIS — M9901 Segmental and somatic dysfunction of cervical region: Secondary | ICD-10-CM | POA: Diagnosis not present

## 2022-04-24 DIAGNOSIS — F0150 Vascular dementia without behavioral disturbance: Secondary | ICD-10-CM

## 2022-04-24 DIAGNOSIS — M9903 Segmental and somatic dysfunction of lumbar region: Secondary | ICD-10-CM | POA: Diagnosis not present

## 2022-04-24 DIAGNOSIS — I10 Essential (primary) hypertension: Secondary | ICD-10-CM

## 2022-04-24 DIAGNOSIS — M9902 Segmental and somatic dysfunction of thoracic region: Secondary | ICD-10-CM | POA: Diagnosis not present

## 2022-04-24 DIAGNOSIS — M9904 Segmental and somatic dysfunction of sacral region: Secondary | ICD-10-CM | POA: Diagnosis not present

## 2022-04-24 NOTE — Patient Instructions (Signed)

## 2022-04-24 NOTE — Progress Notes (Signed)
Jacqueline Bonilla 67 y.o.   Chief Complaint  Patient presents with   Medical Management of Chronic Issues    3 week f/u. Husband states she fell yesterday on her other arm.     HISTORY OF PRESENT ILLNESS: This is a 67 y.o. female here for 86-month follow-up of chronic medical conditions Since her last visit patient fell on 04/06/2022 and fractured right wrist Was seen in the emergency department and then by orthopedist in follow-up.  Doing well. Yesterday was doing some physical therapy exercises at home, got up from a squatting position too fast, lost her balance and fell to her left.  No significant injuries. Accompanied by husband today.  Home health care services have been dispatched to their home in the past couple of weeks to help with activities of daily living. No other complaints or medical concerns today.  HPI   Prior to Admission medications   Medication Sig Start Date End Date Taking? Authorizing Provider  alendronate (FOSAMAX) 70 MG tablet Take 1 tablet (70 mg total) by mouth every 7 (seven) days. Take with a full glass of water on an empty stomach. 02/21/22  Yes Tushar Enns, Eilleen Kempf, MD  Amino Acids Complex TABS Take 1 tablet by mouth daily.   Yes [provider]  aspirin EC 81 MG tablet Take 81 mg by mouth daily. Swallow whole.   Yes [provider]  BRILINTA 90 MG TABS tablet TAKE 1 TABLET(90 MG) BY MOUTH TWICE DAILY 08/20/21  Yes Ah Bott, Eilleen Kempf, MD  CINNAMON PO Take 1 capsule by mouth daily.   Yes [provider]  Coenzyme Q10 (CO Q 10 PO) Take 1 capsule by mouth daily.   Yes [provider]  HYDROcodone-acetaminophen (NORCO) 5-325 MG tablet Take 1 tablet by mouth every 6 (six) hours as needed for moderate pain. 04/06/22  Yes Small, Brooke L, PA  losartan (COZAAR) 25 MG tablet TAKE 1/2 TABLET BY MOUTH DAILY 10/30/21  Yes Onelia Cadmus, Eilleen Kempf, MD  MAGNESIUM PO Take 1 tablet by mouth at bedtime.   Yes [provider]   Multiple Vitamin (MULTIVITAMIN WITH MINERALS) TABS tablet Take 1 tablet by mouth daily.   Yes [provider]  Multiple Vitamins-Minerals (ZINC PO) Take 1 tablet by mouth daily.   Yes [provider]    Allergies  Allergen Reactions   Avelox [Moxifloxacin]    Ciprofloxacin Hives   Floraquin [Iodoquinol]     No cipro or others   Iohexol      Code: HIVES, Desc: HIVES S/P IVP MANY YRS AGO    Levaquin [Levofloxacin]    Plavix [Clopidogrel] Diarrhea   Quinolones    Shellfish Allergy Itching    Itching under the chin. Described by patient but not seen by husband.    Patient Active Problem List   Diagnosis Date Noted   Lower abdominal pain 10/23/2021   Anemia due to acute blood loss 08/07/2021   Internal hemorrhoids 01/10/2021   Dyslipidemia 12/29/2020   History of stroke 07/14/2020   Normocytic anemia 07/11/2020   Vascular dementia with behavior disturbance 10/13/2019   Vascular dementia without behavioral disturbance 07/15/2019   History of COVID-19 04/22/2019   Essential hypertension 03/03/2019    Past Medical History:  Diagnosis Date   Anemia    Anxiety    Depression    Dysmenorrhea    Endometriosis    Fibroid    Hypertension    Implantable loop recorder present 2021   Microhematuria    negative  workup   Osteoporosis    Tachycardia    Thrombocytopenia     Past Surgical History:  Procedure Laterality Date   BREAST BIOPSY     BUBBLE STUDY  12/08/2020   Procedure: BUBBLE STUDY;  Surgeon: Wendall Stade, MD;  Location: [facility] ENDOSCOPY;  Service: Cardiovascular;;   CESAREAN SECTION     hysteroscopic resection     implantable loop recorder implant  10/21/2019   Medtronic Reveal Linq model LNQ 22 (SN [id number]) implantable loop recorder   IR CT HEAD LTD  12/01/2020   IR PERCUTANEOUS ART THROMBECTOMY/INFUSION INTRACRANIAL INC DIAG ANGIO  12/01/2020   IR US GUIDE VASC ACCESS RIGHT  12/01/2020   ORIF HUMERUS FRACTURE Left 07/31/2021   Procedure: OPEN  REDUCTION INTERNAL FIXATION (ORIF) DISTAL HUMERUS FRACTURE;  Surgeon: Roby Lofts, MD;  Location: [facility] OR;  Service: Orthopedics;  Laterality: Left;   RADIOLOGY WITH ANESTHESIA N/A 12/01/2020   Procedure: IR WITH ANESTHESIA - CODE STROKE;  Surgeon: Radiologist, Medication, MD;  Location: [facility] OR;  Service: Radiology;  Laterality: N/A;   TEE WITHOUT CARDIOVERSION N/A 12/08/2020   Procedure: TRANSESOPHAGEAL ECHOCARDIOGRAM (TEE);  Surgeon: Wendall Stade, MD;  Location[facility] ENDOSCOPY;  Service: Cardiovascular;  Laterality: N/A;    Social History   Socioeconomic History   Marital status: Married    Spouse name: Actor   Number of children: 1   Years of education: Not on file   Highest education level: Not on file  Occupational History   Occupation: accounting  Tobacco Use   Smoking status: Never   Smokeless tobacco: Never  Vaping Use   Vaping Use: Never used  Substance and Sexual Activity   Alcohol use: Never   Drug use: Never   Sexual activity: Not Currently    Partners: Male    Comment: husband vasectomy  Other Topics Concern   Not on file  Social History Narrative   Lives with husband   Grandchildren - 1   Works - Biochemist, 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%   Gun in home - yes - secured      Right handed   Caffeine: maybe tea every now and then   Social Determinants of Corporate investment banker Strain: Low Risk  (08/28/2021)   Overall Financial Resource Strain (CARDIA)    Difficulty of Paying Living Expenses: Not hard at all  Food Insecurity: No Food Insecurity (08/28/2021)   Hunger Vital Sign    Worried About Running Out of Food in the Last Year: Never true    Ran Out of Food in the Last Year: Never true  Transportation Needs: No Transportation Needs (08/28/2021)   PRAPARE - Administrator, Civil Service (Medical): No    Lack of Transportation (Non-Medical): No  Physical Activity: Sufficiently Active (08/28/2021)   Exercise Vital Sign    Days of Exercise per Week: 4  days    Minutes of Exercise per Session: 60 min  Stress: No Stress Concern Present (08/28/2021)   [employer] of Occupational Health - Occupational Stress Questionnaire    Feeling of Stress : Not at all  Social Connections: Moderately Integrated (08/28/2021)   Social Connection and Isolation Panel [NHANES]    Frequency of Communication with Friends and Family: More than three times a week    Frequency of Social Gatherings with Friends and Family: Never    Attends Religious Services: More than 4 times per year    Active Member of Clubs or  Organizations: No    Attends Banker Meetings: Never    Marital Status: Married  Catering manager Violence: Not At Risk (08/28/2021)   Humiliation, Afraid, Rape, and Kick questionnaire    Fear of Current or Ex-Partner: No    Emotionally Abused: No    Physically Abused: No    Sexually Abused: No    Family History  Problem Relation Age of Onset   Cancer Father        non hodgkin lymphoma & skin   Heart attack Maternal Grandfather    Dementia Mother    Polymyositis Sister      Review of Systems  Constitutional: Negative.  Negative for chills and fever.  HENT: Negative.  Negative for congestion and sore throat.   Respiratory: Negative.  Negative for cough and shortness of breath.   Cardiovascular: Negative.  Negative for chest pain and palpitations.  Gastrointestinal:  Negative for abdominal pain, diarrhea, nausea and vomiting.  Genitourinary: Negative.  Negative for dysuria and hematuria.  Skin: Negative.  Negative for rash.  Neurological: Negative.  Negative for dizziness and headaches.  All other systems reviewed and are negative.   Vitals:   04/24/22 0922  BP: 128/84  Pulse: 68  Temp: 98.3 F (36.8 C)  SpO2: 100%    Physical Exam Vitals reviewed.  Constitutional:      Appearance: Normal appearance.  HENT:     Head: Normocephalic.  Eyes:     Extraocular Movements: Extraocular movements intact.      Conjunctiva/sclera: Conjunctivae normal.     Pupils: Pupils are equal, round, and reactive to light.  Cardiovascular:     Rate and Rhythm: Normal rate and regular rhythm.     Pulses: Normal pulses.     Heart sounds: Normal heart sounds.  Pulmonary:     Effort: Pulmonary effort is normal.     Breath sounds: Normal breath sounds.  Musculoskeletal:     Cervical back: No tenderness.     Comments: Left upper extremity: Within normal limits.  Full range of motion.  No abnormal findings. Right upper extremity: Cast in place on the right wrist  Lymphadenopathy:     Cervical: No cervical adenopathy.  Skin:    General: Skin is warm and dry.  Neurological:     General: No focal deficit present.     Mental Status: She is alert and oriented to person, place, and time.  Psychiatric:        Mood and Affect: Mood normal.        Behavior: Behavior normal.      ASSESSMENT & PLAN: A total of 44 minutes was spent with the patient and counseling/coordination of care regarding preparing for this visit, review of most recent office visit notes, review of most recent orthopedic office visit notes, review of most recent emergency department visit notes, review of multiple chronic medical conditions under management, review of all medications, prognosis, documentation, need for follow-up.  Problem List Items Addressed This Visit       Cardiovascular and Mediastinum   Essential hypertension - Primary    BP Readings from Last 3 Encounters:  04/24/22 128/84  04/06/22 (!) 166/100  01/23/22 128/76  Well-controlled hypertension. Continue losartan 25 mg daily         Nervous and Auditory   Vascular dementia without behavioral disturbance    Stable and well-controlled.        Other   History of stroke    Secondary stroke prevention discussed. Continues daily  baby aspirin and Brilinta 90 mg twice a day.      Dyslipidemia    Intolerant to statins      Statin intolerance   Patient  Instructions  It is important to avoid accidents which may result in broken bones.  Here are a few ideas on how to make your home safer so you will be less likely to trip or fall.  Use nonskid mats or non slip strips in your shower or tub, on your bathroom floor and around sinks.  If you know that you have spilled water, wipe it up! In the bathroom, it is important to have properly installed grab bars on the walls or on the edge of the tub.  Towel racks are NOT strong enough for you to hold onto or to pull on for support. Stairs and hallways should have enough light.  Add lamps or night lights if you need ore light. It is good to have handrails on both sides of the stairs if possible.  Always fix broken handrails right away. It is important to see the edges of steps.  Paint the edges of outdoor steps white so you can see them better.  Put colored tape on the edge of inside steps. Throw-rugs are dangerous because they can slide.  Removing the rugs is the best idea, but if they must stay, add adhesive carpet tape to prevent slipping. Do not keep things on stairs or in the halls.  Remove small furniture that blocks the halls as it may cause you to trip.  Keep telephone and electrical cords out of the way where you walk. Always were sturdy, rubber-soled shoes for good support.  Never wear just socks, especially on the stairs.  Socks may cause you to slip or fall.  Do not wear full-length housecoats as you can easily trip on the bottom.  Place the things you use the most on the shelves that are the easiest to reach.  If you use a stepstool, make sure it is in good condition.  If you feel unsteady, DO NOT climb, ask for help. If a health professional advises you to use a cane or walker, do not be ashamed.  These items can keep you from falling and breaking your bones.   Edwina Barth, MD [facility]

## 2022-04-24 NOTE — Assessment & Plan Note (Signed)
Stable and well controlled. 

## 2022-04-24 NOTE — Assessment & Plan Note (Signed)
Intolerant to statins. 

## 2022-04-24 NOTE — Assessment & Plan Note (Signed)
BP Readings from Last 3 Encounters:  04/24/22 128/84  04/06/22 (!) 166/100  01/23/22 128/76  Well-controlled hypertension. Continue losartan 25 mg daily

## 2022-04-24 NOTE — Assessment & Plan Note (Signed)
Secondary stroke prevention discussed. Continues daily baby aspirin and Brilinta 90 mg twice a day.

## 2022-04-25 DIAGNOSIS — S42402D Unspecified fracture of lower end of left humerus, subsequent encounter for fracture with routine healing: Secondary | ICD-10-CM | POA: Diagnosis not present

## 2022-04-25 DIAGNOSIS — M25512 Pain in left shoulder: Secondary | ICD-10-CM | POA: Diagnosis not present

## 2022-04-26 ENCOUNTER — Telehealth: Payer: Self-pay | Admitting: Emergency Medicine

## 2022-04-26 DIAGNOSIS — M80031D Age-related osteoporosis with current pathological fracture, right forearm, subsequent encounter for fracture with routine healing: Secondary | ICD-10-CM | POA: Diagnosis not present

## 2022-04-26 DIAGNOSIS — I10 Essential (primary) hypertension: Secondary | ICD-10-CM | POA: Diagnosis not present

## 2022-04-26 DIAGNOSIS — F32A Depression, unspecified: Secondary | ICD-10-CM | POA: Diagnosis not present

## 2022-04-26 DIAGNOSIS — I69351 Hemiplegia and hemiparesis following cerebral infarction affecting right dominant side: Secondary | ICD-10-CM | POA: Diagnosis not present

## 2022-04-26 DIAGNOSIS — I69354 Hemiplegia and hemiparesis following cerebral infarction affecting left non-dominant side: Secondary | ICD-10-CM | POA: Diagnosis not present

## 2022-04-26 DIAGNOSIS — D62 Acute posthemorrhagic anemia: Secondary | ICD-10-CM | POA: Diagnosis not present

## 2022-04-26 DIAGNOSIS — I69318 Other symptoms and signs involving cognitive functions following cerebral infarction: Secondary | ICD-10-CM | POA: Diagnosis not present

## 2022-04-26 DIAGNOSIS — F0154 Vascular dementia, unspecified severity, with anxiety: Secondary | ICD-10-CM | POA: Diagnosis not present

## 2022-04-26 DIAGNOSIS — F0153 Vascular dementia, unspecified severity, with mood disturbance: Secondary | ICD-10-CM | POA: Diagnosis not present

## 2022-04-26 NOTE — Telephone Encounter (Signed)
HH ORDERS   Caller Name: [facility] Agency Name: Randolm Idol Phone #: [phone](secure)  Service Requested: OT evaluation and speech therapy evaluation (examples: OT/PT/Skilled Nursing/Social Work/Speech Therapy/Wound Care)

## 2022-04-27 DIAGNOSIS — F0154 Vascular dementia, unspecified severity, with anxiety: Secondary | ICD-10-CM | POA: Diagnosis not present

## 2022-04-27 DIAGNOSIS — M80031D Age-related osteoporosis with current pathological fracture, right forearm, subsequent encounter for fracture with routine healing: Secondary | ICD-10-CM | POA: Diagnosis not present

## 2022-04-27 DIAGNOSIS — F32A Depression, unspecified: Secondary | ICD-10-CM | POA: Diagnosis not present

## 2022-04-27 DIAGNOSIS — D62 Acute posthemorrhagic anemia: Secondary | ICD-10-CM | POA: Diagnosis not present

## 2022-04-27 DIAGNOSIS — I69318 Other symptoms and signs involving cognitive functions following cerebral infarction: Secondary | ICD-10-CM | POA: Diagnosis not present

## 2022-04-27 DIAGNOSIS — I10 Essential (primary) hypertension: Secondary | ICD-10-CM | POA: Diagnosis not present

## 2022-04-27 DIAGNOSIS — I69354 Hemiplegia and hemiparesis following cerebral infarction affecting left non-dominant side: Secondary | ICD-10-CM | POA: Diagnosis not present

## 2022-04-27 DIAGNOSIS — F0153 Vascular dementia, unspecified severity, with mood disturbance: Secondary | ICD-10-CM | POA: Diagnosis not present

## 2022-04-27 DIAGNOSIS — I69351 Hemiplegia and hemiparesis following cerebral infarction affecting right dominant side: Secondary | ICD-10-CM | POA: Diagnosis not present

## 2022-04-30 ENCOUNTER — Telehealth: Payer: Self-pay | Admitting: Emergency Medicine

## 2022-04-30 ENCOUNTER — Ambulatory Visit (INDEPENDENT_AMBULATORY_CARE_PROVIDER_SITE_OTHER): Payer: Medicare HMO

## 2022-04-30 DIAGNOSIS — S52571D Other intraarticular fracture of lower end of right radius, subsequent encounter for closed fracture with routine healing: Secondary | ICD-10-CM | POA: Diagnosis not present

## 2022-04-30 DIAGNOSIS — I63321 Cerebral infarction due to thrombosis of right anterior cerebral artery: Secondary | ICD-10-CM | POA: Diagnosis not present

## 2022-04-30 LAB — CUP PACEART REMOTE DEVICE CHECK
Date Time Interrogation Session: 20240428230937
Implantable Pulse Generator Implant Date: 20211020

## 2022-04-30 NOTE — Telephone Encounter (Signed)
Please ok verbals

## 2022-04-30 NOTE — Telephone Encounter (Signed)
Okay with requested orders.  Thanks.

## 2022-04-30 NOTE — Telephone Encounter (Signed)
Caller & What Company:  Amy with [facility]   Phone Number:  [phone]   Needs Verbal orders for what service & frequency:  Home Health Physical therapy 2 x a week for 2 weeks, 1 x a week for  4 weeks

## 2022-04-30 NOTE — Telephone Encounter (Signed)
Called Amy at [facility] to ok verbals per provider

## 2022-05-01 ENCOUNTER — Telehealth: Payer: Self-pay | Admitting: Emergency Medicine

## 2022-05-01 DIAGNOSIS — F0153 Vascular dementia, unspecified severity, with mood disturbance: Secondary | ICD-10-CM | POA: Diagnosis not present

## 2022-05-01 DIAGNOSIS — I69354 Hemiplegia and hemiparesis following cerebral infarction affecting left non-dominant side: Secondary | ICD-10-CM | POA: Diagnosis not present

## 2022-05-01 DIAGNOSIS — F32A Depression, unspecified: Secondary | ICD-10-CM | POA: Diagnosis not present

## 2022-05-01 DIAGNOSIS — I69351 Hemiplegia and hemiparesis following cerebral infarction affecting right dominant side: Secondary | ICD-10-CM | POA: Diagnosis not present

## 2022-05-01 DIAGNOSIS — M80031D Age-related osteoporosis with current pathological fracture, right forearm, subsequent encounter for fracture with routine healing: Secondary | ICD-10-CM | POA: Diagnosis not present

## 2022-05-01 DIAGNOSIS — D62 Acute posthemorrhagic anemia: Secondary | ICD-10-CM | POA: Diagnosis not present

## 2022-05-01 DIAGNOSIS — I69318 Other symptoms and signs involving cognitive functions following cerebral infarction: Secondary | ICD-10-CM | POA: Diagnosis not present

## 2022-05-01 DIAGNOSIS — I10 Essential (primary) hypertension: Secondary | ICD-10-CM | POA: Diagnosis not present

## 2022-05-01 DIAGNOSIS — F0154 Vascular dementia, unspecified severity, with anxiety: Secondary | ICD-10-CM | POA: Diagnosis not present

## 2022-05-01 NOTE — Telephone Encounter (Signed)
Okay with recommendations.

## 2022-05-01 NOTE — Telephone Encounter (Signed)
Called April at [facility] and left message on [employer] to ok verbals per provider

## 2022-05-01 NOTE — Telephone Encounter (Signed)
Caller & What Company:April with [facility]   Phone Number:[phone]   Needs Verbal orders for what service & frequency:  adding OT and Speech evaluation

## 2022-05-03 DIAGNOSIS — D62 Acute posthemorrhagic anemia: Secondary | ICD-10-CM | POA: Diagnosis not present

## 2022-05-03 DIAGNOSIS — F32A Depression, unspecified: Secondary | ICD-10-CM | POA: Diagnosis not present

## 2022-05-03 DIAGNOSIS — I69354 Hemiplegia and hemiparesis following cerebral infarction affecting left non-dominant side: Secondary | ICD-10-CM | POA: Diagnosis not present

## 2022-05-03 DIAGNOSIS — F0154 Vascular dementia, unspecified severity, with anxiety: Secondary | ICD-10-CM | POA: Diagnosis not present

## 2022-05-03 DIAGNOSIS — M80031D Age-related osteoporosis with current pathological fracture, right forearm, subsequent encounter for fracture with routine healing: Secondary | ICD-10-CM | POA: Diagnosis not present

## 2022-05-03 DIAGNOSIS — I10 Essential (primary) hypertension: Secondary | ICD-10-CM | POA: Diagnosis not present

## 2022-05-03 DIAGNOSIS — I69318 Other symptoms and signs involving cognitive functions following cerebral infarction: Secondary | ICD-10-CM | POA: Diagnosis not present

## 2022-05-03 DIAGNOSIS — I69351 Hemiplegia and hemiparesis following cerebral infarction affecting right dominant side: Secondary | ICD-10-CM | POA: Diagnosis not present

## 2022-05-03 DIAGNOSIS — F0153 Vascular dementia, unspecified severity, with mood disturbance: Secondary | ICD-10-CM | POA: Diagnosis not present

## 2022-05-03 NOTE — Progress Notes (Signed)
Carelink Summary Report / Loop Recorder 

## 2022-05-04 DIAGNOSIS — I69354 Hemiplegia and hemiparesis following cerebral infarction affecting left non-dominant side: Secondary | ICD-10-CM | POA: Diagnosis not present

## 2022-05-04 DIAGNOSIS — I69318 Other symptoms and signs involving cognitive functions following cerebral infarction: Secondary | ICD-10-CM | POA: Diagnosis not present

## 2022-05-04 DIAGNOSIS — F32A Depression, unspecified: Secondary | ICD-10-CM | POA: Diagnosis not present

## 2022-05-04 DIAGNOSIS — D62 Acute posthemorrhagic anemia: Secondary | ICD-10-CM | POA: Diagnosis not present

## 2022-05-04 DIAGNOSIS — M80031D Age-related osteoporosis with current pathological fracture, right forearm, subsequent encounter for fracture with routine healing: Secondary | ICD-10-CM | POA: Diagnosis not present

## 2022-05-04 DIAGNOSIS — F0154 Vascular dementia, unspecified severity, with anxiety: Secondary | ICD-10-CM | POA: Diagnosis not present

## 2022-05-04 DIAGNOSIS — I10 Essential (primary) hypertension: Secondary | ICD-10-CM | POA: Diagnosis not present

## 2022-05-04 DIAGNOSIS — I69351 Hemiplegia and hemiparesis following cerebral infarction affecting right dominant side: Secondary | ICD-10-CM | POA: Diagnosis not present

## 2022-05-04 DIAGNOSIS — F0153 Vascular dementia, unspecified severity, with mood disturbance: Secondary | ICD-10-CM | POA: Diagnosis not present

## 2022-05-07 DIAGNOSIS — D696 Thrombocytopenia, unspecified: Secondary | ICD-10-CM

## 2022-05-07 DIAGNOSIS — E785 Hyperlipidemia, unspecified: Secondary | ICD-10-CM

## 2022-05-07 DIAGNOSIS — F32A Depression, unspecified: Secondary | ICD-10-CM | POA: Diagnosis not present

## 2022-05-07 DIAGNOSIS — I69318 Other symptoms and signs involving cognitive functions following cerebral infarction: Secondary | ICD-10-CM | POA: Diagnosis not present

## 2022-05-07 DIAGNOSIS — K648 Other hemorrhoids: Secondary | ICD-10-CM

## 2022-05-07 DIAGNOSIS — Z7982 Long term (current) use of aspirin: Secondary | ICD-10-CM

## 2022-05-07 DIAGNOSIS — F0154 Vascular dementia, unspecified severity, with anxiety: Secondary | ICD-10-CM | POA: Diagnosis not present

## 2022-05-07 DIAGNOSIS — I10 Essential (primary) hypertension: Secondary | ICD-10-CM | POA: Diagnosis not present

## 2022-05-07 DIAGNOSIS — I69351 Hemiplegia and hemiparesis following cerebral infarction affecting right dominant side: Secondary | ICD-10-CM | POA: Diagnosis not present

## 2022-05-07 DIAGNOSIS — Z8616 Personal history of COVID-19: Secondary | ICD-10-CM

## 2022-05-07 DIAGNOSIS — Z95818 Presence of other cardiac implants and grafts: Secondary | ICD-10-CM

## 2022-05-07 DIAGNOSIS — Z9181 History of falling: Secondary | ICD-10-CM

## 2022-05-07 DIAGNOSIS — Z7902 Long term (current) use of antithrombotics/antiplatelets: Secondary | ICD-10-CM

## 2022-05-07 DIAGNOSIS — M80031D Age-related osteoporosis with current pathological fracture, right forearm, subsequent encounter for fracture with routine healing: Secondary | ICD-10-CM | POA: Diagnosis not present

## 2022-05-07 DIAGNOSIS — F0153 Vascular dementia, unspecified severity, with mood disturbance: Secondary | ICD-10-CM | POA: Diagnosis not present

## 2022-05-07 DIAGNOSIS — I69354 Hemiplegia and hemiparesis following cerebral infarction affecting left non-dominant side: Secondary | ICD-10-CM | POA: Diagnosis not present

## 2022-05-07 DIAGNOSIS — D62 Acute posthemorrhagic anemia: Secondary | ICD-10-CM | POA: Diagnosis not present

## 2022-05-09 DIAGNOSIS — Z0100 Encounter for examination of eyes and vision without abnormal findings: Secondary | ICD-10-CM | POA: Diagnosis not present

## 2022-05-09 DIAGNOSIS — H2513 Age-related nuclear cataract, bilateral: Secondary | ICD-10-CM | POA: Diagnosis not present

## 2022-05-10 DIAGNOSIS — F32A Depression, unspecified: Secondary | ICD-10-CM | POA: Diagnosis not present

## 2022-05-10 DIAGNOSIS — F0154 Vascular dementia, unspecified severity, with anxiety: Secondary | ICD-10-CM | POA: Diagnosis not present

## 2022-05-10 DIAGNOSIS — M80031D Age-related osteoporosis with current pathological fracture, right forearm, subsequent encounter for fracture with routine healing: Secondary | ICD-10-CM | POA: Diagnosis not present

## 2022-05-10 DIAGNOSIS — I69354 Hemiplegia and hemiparesis following cerebral infarction affecting left non-dominant side: Secondary | ICD-10-CM | POA: Diagnosis not present

## 2022-05-10 DIAGNOSIS — I69351 Hemiplegia and hemiparesis following cerebral infarction affecting right dominant side: Secondary | ICD-10-CM | POA: Diagnosis not present

## 2022-05-10 DIAGNOSIS — I10 Essential (primary) hypertension: Secondary | ICD-10-CM | POA: Diagnosis not present

## 2022-05-10 DIAGNOSIS — D62 Acute posthemorrhagic anemia: Secondary | ICD-10-CM | POA: Diagnosis not present

## 2022-05-10 DIAGNOSIS — F0153 Vascular dementia, unspecified severity, with mood disturbance: Secondary | ICD-10-CM | POA: Diagnosis not present

## 2022-05-10 DIAGNOSIS — I69318 Other symptoms and signs involving cognitive functions following cerebral infarction: Secondary | ICD-10-CM | POA: Diagnosis not present

## 2022-05-11 ENCOUNTER — Telehealth: Payer: Self-pay | Admitting: Emergency Medicine

## 2022-05-11 NOTE — Telephone Encounter (Signed)
Called patient husband and advise for patient to use imodium of diarrhea if needed. He stated the diarrhea now has stopped, but her stomach is upset. I advise not to use to imodium if the diarrhea has stopped. He could try OTC pepto to help with her stomach discomfort, staying away from greasy food, try drinking ginger ale, Gatorade. Advise if the diarrhea gets worse over the weekend to go to the urgent care.

## 2022-05-11 NOTE — Telephone Encounter (Signed)
Pt husband call wanted to know what pt can take for diarrhea that's OVC due to concerns about her being on Bp medications.

## 2022-05-14 DIAGNOSIS — S52571A Other intraarticular fracture of lower end of right radius, initial encounter for closed fracture: Secondary | ICD-10-CM | POA: Diagnosis not present

## 2022-05-15 DIAGNOSIS — D62 Acute posthemorrhagic anemia: Secondary | ICD-10-CM | POA: Diagnosis not present

## 2022-05-15 DIAGNOSIS — I69354 Hemiplegia and hemiparesis following cerebral infarction affecting left non-dominant side: Secondary | ICD-10-CM | POA: Diagnosis not present

## 2022-05-15 DIAGNOSIS — I69351 Hemiplegia and hemiparesis following cerebral infarction affecting right dominant side: Secondary | ICD-10-CM | POA: Diagnosis not present

## 2022-05-15 DIAGNOSIS — F0154 Vascular dementia, unspecified severity, with anxiety: Secondary | ICD-10-CM | POA: Diagnosis not present

## 2022-05-15 DIAGNOSIS — F32A Depression, unspecified: Secondary | ICD-10-CM | POA: Diagnosis not present

## 2022-05-15 DIAGNOSIS — M80031D Age-related osteoporosis with current pathological fracture, right forearm, subsequent encounter for fracture with routine healing: Secondary | ICD-10-CM | POA: Diagnosis not present

## 2022-05-15 DIAGNOSIS — I69318 Other symptoms and signs involving cognitive functions following cerebral infarction: Secondary | ICD-10-CM | POA: Diagnosis not present

## 2022-05-15 DIAGNOSIS — I10 Essential (primary) hypertension: Secondary | ICD-10-CM | POA: Diagnosis not present

## 2022-05-15 DIAGNOSIS — F0153 Vascular dementia, unspecified severity, with mood disturbance: Secondary | ICD-10-CM | POA: Diagnosis not present

## 2022-05-22 ENCOUNTER — Telehealth: Payer: Self-pay | Admitting: Emergency Medicine

## 2022-05-22 DIAGNOSIS — F32A Depression, unspecified: Secondary | ICD-10-CM | POA: Diagnosis not present

## 2022-05-22 DIAGNOSIS — M80031D Age-related osteoporosis with current pathological fracture, right forearm, subsequent encounter for fracture with routine healing: Secondary | ICD-10-CM | POA: Diagnosis not present

## 2022-05-22 DIAGNOSIS — I69354 Hemiplegia and hemiparesis following cerebral infarction affecting left non-dominant side: Secondary | ICD-10-CM | POA: Diagnosis not present

## 2022-05-22 DIAGNOSIS — I10 Essential (primary) hypertension: Secondary | ICD-10-CM | POA: Diagnosis not present

## 2022-05-22 DIAGNOSIS — I69351 Hemiplegia and hemiparesis following cerebral infarction affecting right dominant side: Secondary | ICD-10-CM | POA: Diagnosis not present

## 2022-05-22 DIAGNOSIS — D62 Acute posthemorrhagic anemia: Secondary | ICD-10-CM | POA: Diagnosis not present

## 2022-05-22 DIAGNOSIS — I69318 Other symptoms and signs involving cognitive functions following cerebral infarction: Secondary | ICD-10-CM | POA: Diagnosis not present

## 2022-05-22 DIAGNOSIS — F0154 Vascular dementia, unspecified severity, with anxiety: Secondary | ICD-10-CM | POA: Diagnosis not present

## 2022-05-22 DIAGNOSIS — F0153 Vascular dementia, unspecified severity, with mood disturbance: Secondary | ICD-10-CM | POA: Diagnosis not present

## 2022-05-22 NOTE — Telephone Encounter (Signed)
Pt called about hiccups, and burning in chest. Please advise.  Best call back Billey Gosling (Husband) [phone]

## 2022-05-22 NOTE — Telephone Encounter (Signed)
Called pt spoke w/ Husband gave MD response.Marland KitchenRaechel Bonilla

## 2022-05-22 NOTE — Telephone Encounter (Signed)
Could be reflux.  Recommend trial of over-the-counter Mylanta liquid antacid and over-the-counter Nexium.  Thanks.

## 2022-05-24 DIAGNOSIS — I69354 Hemiplegia and hemiparesis following cerebral infarction affecting left non-dominant side: Secondary | ICD-10-CM | POA: Diagnosis not present

## 2022-05-24 DIAGNOSIS — D62 Acute posthemorrhagic anemia: Secondary | ICD-10-CM | POA: Diagnosis not present

## 2022-05-24 DIAGNOSIS — I69318 Other symptoms and signs involving cognitive functions following cerebral infarction: Secondary | ICD-10-CM | POA: Diagnosis not present

## 2022-05-24 DIAGNOSIS — I10 Essential (primary) hypertension: Secondary | ICD-10-CM | POA: Diagnosis not present

## 2022-05-24 DIAGNOSIS — I69351 Hemiplegia and hemiparesis following cerebral infarction affecting right dominant side: Secondary | ICD-10-CM | POA: Diagnosis not present

## 2022-05-24 DIAGNOSIS — F0154 Vascular dementia, unspecified severity, with anxiety: Secondary | ICD-10-CM | POA: Diagnosis not present

## 2022-05-24 DIAGNOSIS — M80031D Age-related osteoporosis with current pathological fracture, right forearm, subsequent encounter for fracture with routine healing: Secondary | ICD-10-CM | POA: Diagnosis not present

## 2022-05-24 DIAGNOSIS — F32A Depression, unspecified: Secondary | ICD-10-CM | POA: Diagnosis not present

## 2022-05-24 DIAGNOSIS — F0153 Vascular dementia, unspecified severity, with mood disturbance: Secondary | ICD-10-CM | POA: Diagnosis not present

## 2022-05-25 ENCOUNTER — Telehealth (HOSPITAL_COMMUNITY): Payer: Self-pay | Admitting: *Deleted

## 2022-05-25 ENCOUNTER — Other Ambulatory Visit (HOSPITAL_COMMUNITY): Payer: Self-pay | Admitting: *Deleted

## 2022-05-25 ENCOUNTER — Encounter: Payer: Self-pay | Admitting: Emergency Medicine

## 2022-05-25 DIAGNOSIS — I69354 Hemiplegia and hemiparesis following cerebral infarction affecting left non-dominant side: Secondary | ICD-10-CM | POA: Diagnosis not present

## 2022-05-25 DIAGNOSIS — I69351 Hemiplegia and hemiparesis following cerebral infarction affecting right dominant side: Secondary | ICD-10-CM | POA: Diagnosis not present

## 2022-05-25 DIAGNOSIS — I10 Essential (primary) hypertension: Secondary | ICD-10-CM | POA: Diagnosis not present

## 2022-05-25 DIAGNOSIS — F32A Depression, unspecified: Secondary | ICD-10-CM | POA: Diagnosis not present

## 2022-05-25 DIAGNOSIS — Z8673 Personal history of transient ischemic attack (TIA), and cerebral infarction without residual deficits: Secondary | ICD-10-CM

## 2022-05-25 DIAGNOSIS — M80031D Age-related osteoporosis with current pathological fracture, right forearm, subsequent encounter for fracture with routine healing: Secondary | ICD-10-CM | POA: Diagnosis not present

## 2022-05-25 DIAGNOSIS — F0153 Vascular dementia, unspecified severity, with mood disturbance: Secondary | ICD-10-CM | POA: Diagnosis not present

## 2022-05-25 DIAGNOSIS — I69318 Other symptoms and signs involving cognitive functions following cerebral infarction: Secondary | ICD-10-CM | POA: Diagnosis not present

## 2022-05-25 DIAGNOSIS — R1310 Dysphagia, unspecified: Secondary | ICD-10-CM

## 2022-05-25 DIAGNOSIS — F0154 Vascular dementia, unspecified severity, with anxiety: Secondary | ICD-10-CM | POA: Diagnosis not present

## 2022-05-25 DIAGNOSIS — D62 Acute posthemorrhagic anemia: Secondary | ICD-10-CM | POA: Diagnosis not present

## 2022-05-25 NOTE — Telephone Encounter (Signed)
Okay to authorize.  Thanks.

## 2022-05-25 NOTE — Telephone Encounter (Signed)
Yes, ok for verbal

## 2022-05-25 NOTE — Telephone Encounter (Signed)
Attempted to contact patient to schedule OP MBS. Left VM. RKEEL 

## 2022-05-25 NOTE — Telephone Encounter (Signed)
Message sent in provider absence. Please advise on verbal orders for  modified barium swallow study

## 2022-05-29 DIAGNOSIS — I69354 Hemiplegia and hemiparesis following cerebral infarction affecting left non-dominant side: Secondary | ICD-10-CM | POA: Diagnosis not present

## 2022-05-29 DIAGNOSIS — M80031D Age-related osteoporosis with current pathological fracture, right forearm, subsequent encounter for fracture with routine healing: Secondary | ICD-10-CM | POA: Diagnosis not present

## 2022-05-29 DIAGNOSIS — D62 Acute posthemorrhagic anemia: Secondary | ICD-10-CM | POA: Diagnosis not present

## 2022-05-29 DIAGNOSIS — I69318 Other symptoms and signs involving cognitive functions following cerebral infarction: Secondary | ICD-10-CM | POA: Diagnosis not present

## 2022-05-29 DIAGNOSIS — I69351 Hemiplegia and hemiparesis following cerebral infarction affecting right dominant side: Secondary | ICD-10-CM | POA: Diagnosis not present

## 2022-05-29 DIAGNOSIS — I10 Essential (primary) hypertension: Secondary | ICD-10-CM | POA: Diagnosis not present

## 2022-05-29 DIAGNOSIS — F32A Depression, unspecified: Secondary | ICD-10-CM | POA: Diagnosis not present

## 2022-05-29 DIAGNOSIS — F0153 Vascular dementia, unspecified severity, with mood disturbance: Secondary | ICD-10-CM | POA: Diagnosis not present

## 2022-05-29 DIAGNOSIS — F0154 Vascular dementia, unspecified severity, with anxiety: Secondary | ICD-10-CM | POA: Diagnosis not present

## 2022-05-30 ENCOUNTER — Ambulatory Visit (HOSPITAL_COMMUNITY)
Admission: RE | Admit: 2022-05-30 | Discharge: 2022-05-30 | Disposition: A | Discharge status: Home / Self Care | Payer: Medicare HMO | Source: Ambulatory Visit | Attending: Emergency Medicine | Admitting: Emergency Medicine

## 2022-05-30 DIAGNOSIS — F0280 Dementia in other diseases classified elsewhere without behavioral disturbance: Secondary | ICD-10-CM | POA: Insufficient documentation

## 2022-05-30 DIAGNOSIS — Z8673 Personal history of transient ischemic attack (TIA), and cerebral infarction without residual deficits: Secondary | ICD-10-CM | POA: Insufficient documentation

## 2022-05-30 DIAGNOSIS — R059 Cough, unspecified: Secondary | ICD-10-CM | POA: Diagnosis not present

## 2022-05-30 DIAGNOSIS — R1310 Dysphagia, unspecified: Secondary | ICD-10-CM | POA: Insufficient documentation

## 2022-05-30 DIAGNOSIS — Z0389 Encounter for observation for other suspected diseases and conditions ruled out: Secondary | ICD-10-CM | POA: Diagnosis not present

## 2022-05-30 NOTE — Progress Notes (Signed)
Modified Barium Swallow Study  Patient Details  Name: LEANER BUSA MRN: [id number] Date of Birth: 02-17-1955  Today's Date: 05/30/2022  Modified Barium Swallow completed.  Full report located under Chart Review in the Imaging Section.  History of Present Illness 67 yr old seen for outpatient MBS accompanied by her husband. Pt's spouse provided history of pt coughing and throat clearing more with solids during meals, belching, hiccups. History of stroke, Parkinsonism, dementia, GERD (not on medication).   Clinical Impression Pt demonstrated normal oropharyngeal swallow function across consistencies with suspected primary esophageal involvement. She was able to control, contain and transit various bolus viscosities without difficulty in oral cavity. Timely and complete mastication with clearance of regular texture. Pt's timing, strength and ROM of pharyngeal musculature was adequate to prevent aspiration. There was one episode of flash penetration while using a straw that is within normal limits. No difficulty pharyngeally transiting pill with thin liquid through pharynx. No pharyngeal residue appreciated. Esophagus was scanned and pill transited into stomach timely however subsequent sips thin barium given with residue remaining in distal esophagus with delayed entrace through GE junction. Pt had episodes of throat clearing and a cough during study without material seen in laryngeal vestibule or trachea. Suspect she may be sensing esophageal retention causing throat clears/coughs. Results discussed with pt and spouse with recommendations to continue regular texture, thin liquids, pills with thin and educated re: esophageal precautions. Also pt can discuss any benefit of meds for reflux with MD that may ease symptoms. No further ST is warranted. Factors that may increase risk of adverse event in presence of aspiration Rubye Oaks & Clearance Coots 2021):    Swallow Evaluation  Recommendations Recommendations: PO diet PO Diet Recommendation: Regular;Thin liquids (Level 0) Liquid Administration via: Cup;Straw Medication Administration: Whole meds with liquid Supervision: Patient able to self-feed Swallowing strategies  : Slow rate;Small bites/sips Postural changes: Stay upright 30-60 min after meals;Position pt fully upright for meals Oral care recommendations: Oral care BID (2x/day)      Royce Macadamia 05/30/2022,2:21 PM

## 2022-05-30 NOTE — Progress Notes (Signed)
Carelink Summary Report / Loop Recorder 

## 2022-05-31 DIAGNOSIS — I69318 Other symptoms and signs involving cognitive functions following cerebral infarction: Secondary | ICD-10-CM | POA: Diagnosis not present

## 2022-05-31 DIAGNOSIS — F32A Depression, unspecified: Secondary | ICD-10-CM | POA: Diagnosis not present

## 2022-05-31 DIAGNOSIS — M80031D Age-related osteoporosis with current pathological fracture, right forearm, subsequent encounter for fracture with routine healing: Secondary | ICD-10-CM | POA: Diagnosis not present

## 2022-05-31 DIAGNOSIS — I10 Essential (primary) hypertension: Secondary | ICD-10-CM | POA: Diagnosis not present

## 2022-05-31 DIAGNOSIS — F0154 Vascular dementia, unspecified severity, with anxiety: Secondary | ICD-10-CM | POA: Diagnosis not present

## 2022-05-31 DIAGNOSIS — I69354 Hemiplegia and hemiparesis following cerebral infarction affecting left non-dominant side: Secondary | ICD-10-CM | POA: Diagnosis not present

## 2022-05-31 DIAGNOSIS — D62 Acute posthemorrhagic anemia: Secondary | ICD-10-CM | POA: Diagnosis not present

## 2022-05-31 DIAGNOSIS — I69351 Hemiplegia and hemiparesis following cerebral infarction affecting right dominant side: Secondary | ICD-10-CM | POA: Diagnosis not present

## 2022-05-31 DIAGNOSIS — F0153 Vascular dementia, unspecified severity, with mood disturbance: Secondary | ICD-10-CM | POA: Diagnosis not present

## 2022-06-04 ENCOUNTER — Ambulatory Visit (INDEPENDENT_AMBULATORY_CARE_PROVIDER_SITE_OTHER): Payer: Medicare HMO

## 2022-06-04 DIAGNOSIS — I639 Cerebral infarction, unspecified: Secondary | ICD-10-CM | POA: Diagnosis not present

## 2022-06-04 LAB — CUP PACEART REMOTE DEVICE CHECK
Date Time Interrogation Session: 20240602230538
Implantable Pulse Generator Implant Date: 20211020

## 2022-06-06 DIAGNOSIS — M80031D Age-related osteoporosis with current pathological fracture, right forearm, subsequent encounter for fracture with routine healing: Secondary | ICD-10-CM | POA: Diagnosis not present

## 2022-06-06 DIAGNOSIS — D62 Acute posthemorrhagic anemia: Secondary | ICD-10-CM | POA: Diagnosis not present

## 2022-06-06 DIAGNOSIS — F0153 Vascular dementia, unspecified severity, with mood disturbance: Secondary | ICD-10-CM | POA: Diagnosis not present

## 2022-06-06 DIAGNOSIS — F32A Depression, unspecified: Secondary | ICD-10-CM | POA: Diagnosis not present

## 2022-06-06 DIAGNOSIS — I69351 Hemiplegia and hemiparesis following cerebral infarction affecting right dominant side: Secondary | ICD-10-CM | POA: Diagnosis not present

## 2022-06-06 DIAGNOSIS — I69354 Hemiplegia and hemiparesis following cerebral infarction affecting left non-dominant side: Secondary | ICD-10-CM | POA: Diagnosis not present

## 2022-06-06 DIAGNOSIS — I10 Essential (primary) hypertension: Secondary | ICD-10-CM | POA: Diagnosis not present

## 2022-06-06 DIAGNOSIS — F0154 Vascular dementia, unspecified severity, with anxiety: Secondary | ICD-10-CM | POA: Diagnosis not present

## 2022-06-06 DIAGNOSIS — I69318 Other symptoms and signs involving cognitive functions following cerebral infarction: Secondary | ICD-10-CM | POA: Diagnosis not present

## 2022-06-12 DIAGNOSIS — I69318 Other symptoms and signs involving cognitive functions following cerebral infarction: Secondary | ICD-10-CM | POA: Diagnosis not present

## 2022-06-12 DIAGNOSIS — F0154 Vascular dementia, unspecified severity, with anxiety: Secondary | ICD-10-CM | POA: Diagnosis not present

## 2022-06-12 DIAGNOSIS — D62 Acute posthemorrhagic anemia: Secondary | ICD-10-CM | POA: Diagnosis not present

## 2022-06-12 DIAGNOSIS — F32A Depression, unspecified: Secondary | ICD-10-CM | POA: Diagnosis not present

## 2022-06-12 DIAGNOSIS — M80031D Age-related osteoporosis with current pathological fracture, right forearm, subsequent encounter for fracture with routine healing: Secondary | ICD-10-CM | POA: Diagnosis not present

## 2022-06-12 DIAGNOSIS — F0153 Vascular dementia, unspecified severity, with mood disturbance: Secondary | ICD-10-CM | POA: Diagnosis not present

## 2022-06-12 DIAGNOSIS — I10 Essential (primary) hypertension: Secondary | ICD-10-CM | POA: Diagnosis not present

## 2022-06-12 DIAGNOSIS — I69351 Hemiplegia and hemiparesis following cerebral infarction affecting right dominant side: Secondary | ICD-10-CM | POA: Diagnosis not present

## 2022-06-12 DIAGNOSIS — I69354 Hemiplegia and hemiparesis following cerebral infarction affecting left non-dominant side: Secondary | ICD-10-CM | POA: Diagnosis not present

## 2022-06-16 DIAGNOSIS — F0153 Vascular dementia, unspecified severity, with mood disturbance: Secondary | ICD-10-CM | POA: Diagnosis not present

## 2022-06-16 DIAGNOSIS — M80031D Age-related osteoporosis with current pathological fracture, right forearm, subsequent encounter for fracture with routine healing: Secondary | ICD-10-CM | POA: Diagnosis not present

## 2022-06-16 DIAGNOSIS — D62 Acute posthemorrhagic anemia: Secondary | ICD-10-CM | POA: Diagnosis not present

## 2022-06-16 DIAGNOSIS — F0154 Vascular dementia, unspecified severity, with anxiety: Secondary | ICD-10-CM | POA: Diagnosis not present

## 2022-06-16 DIAGNOSIS — F32A Depression, unspecified: Secondary | ICD-10-CM | POA: Diagnosis not present

## 2022-06-16 DIAGNOSIS — I10 Essential (primary) hypertension: Secondary | ICD-10-CM | POA: Diagnosis not present

## 2022-06-16 DIAGNOSIS — I69354 Hemiplegia and hemiparesis following cerebral infarction affecting left non-dominant side: Secondary | ICD-10-CM | POA: Diagnosis not present

## 2022-06-16 DIAGNOSIS — I69318 Other symptoms and signs involving cognitive functions following cerebral infarction: Secondary | ICD-10-CM | POA: Diagnosis not present

## 2022-06-16 DIAGNOSIS — I69351 Hemiplegia and hemiparesis following cerebral infarction affecting right dominant side: Secondary | ICD-10-CM | POA: Diagnosis not present

## 2022-06-18 ENCOUNTER — Telehealth: Payer: Self-pay | Admitting: Emergency Medicine

## 2022-06-18 NOTE — Telephone Encounter (Signed)
Pt have a fax that need to be filled out coming from [facility] for memory care please call Ellamae Sia [phone] when forms are completed.

## 2022-06-19 DIAGNOSIS — S52571A Other intraarticular fracture of lower end of right radius, initial encounter for closed fracture: Secondary | ICD-10-CM | POA: Diagnosis not present

## 2022-06-20 DIAGNOSIS — I69351 Hemiplegia and hemiparesis following cerebral infarction affecting right dominant side: Secondary | ICD-10-CM | POA: Diagnosis not present

## 2022-06-20 DIAGNOSIS — I69318 Other symptoms and signs involving cognitive functions following cerebral infarction: Secondary | ICD-10-CM | POA: Diagnosis not present

## 2022-06-20 DIAGNOSIS — D62 Acute posthemorrhagic anemia: Secondary | ICD-10-CM | POA: Diagnosis not present

## 2022-06-20 DIAGNOSIS — F0153 Vascular dementia, unspecified severity, with mood disturbance: Secondary | ICD-10-CM | POA: Diagnosis not present

## 2022-06-20 DIAGNOSIS — F0154 Vascular dementia, unspecified severity, with anxiety: Secondary | ICD-10-CM | POA: Diagnosis not present

## 2022-06-20 DIAGNOSIS — M80031D Age-related osteoporosis with current pathological fracture, right forearm, subsequent encounter for fracture with routine healing: Secondary | ICD-10-CM | POA: Diagnosis not present

## 2022-06-20 DIAGNOSIS — I10 Essential (primary) hypertension: Secondary | ICD-10-CM | POA: Diagnosis not present

## 2022-06-20 DIAGNOSIS — F32A Depression, unspecified: Secondary | ICD-10-CM | POA: Diagnosis not present

## 2022-06-20 DIAGNOSIS — I69354 Hemiplegia and hemiparesis following cerebral infarction affecting left non-dominant side: Secondary | ICD-10-CM | POA: Diagnosis not present

## 2022-06-20 NOTE — Telephone Encounter (Signed)
Patient forms placed in provider box in his office. Will contact patient husband when the form is completed.

## 2022-06-21 NOTE — Telephone Encounter (Signed)
Forms faxed and confirmation received. Forms scanned in patient's chart

## 2022-06-26 NOTE — Progress Notes (Signed)
Carelink Summary Report / Loop Recorder 

## 2022-06-27 DIAGNOSIS — I10 Essential (primary) hypertension: Secondary | ICD-10-CM | POA: Diagnosis not present

## 2022-06-27 DIAGNOSIS — I69351 Hemiplegia and hemiparesis following cerebral infarction affecting right dominant side: Secondary | ICD-10-CM | POA: Diagnosis not present

## 2022-06-27 DIAGNOSIS — I69318 Other symptoms and signs involving cognitive functions following cerebral infarction: Secondary | ICD-10-CM | POA: Diagnosis not present

## 2022-06-27 DIAGNOSIS — E785 Hyperlipidemia, unspecified: Secondary | ICD-10-CM | POA: Diagnosis not present

## 2022-06-27 DIAGNOSIS — F0154 Vascular dementia, unspecified severity, with anxiety: Secondary | ICD-10-CM | POA: Diagnosis not present

## 2022-06-27 DIAGNOSIS — M80031D Age-related osteoporosis with current pathological fracture, right forearm, subsequent encounter for fracture with routine healing: Secondary | ICD-10-CM | POA: Diagnosis not present

## 2022-06-27 DIAGNOSIS — F32A Depression, unspecified: Secondary | ICD-10-CM | POA: Diagnosis not present

## 2022-06-27 DIAGNOSIS — F0153 Vascular dementia, unspecified severity, with mood disturbance: Secondary | ICD-10-CM | POA: Diagnosis not present

## 2022-06-27 DIAGNOSIS — I69354 Hemiplegia and hemiparesis following cerebral infarction affecting left non-dominant side: Secondary | ICD-10-CM | POA: Diagnosis not present

## 2022-06-29 DIAGNOSIS — I69354 Hemiplegia and hemiparesis following cerebral infarction affecting left non-dominant side: Secondary | ICD-10-CM | POA: Diagnosis not present

## 2022-06-29 DIAGNOSIS — I10 Essential (primary) hypertension: Secondary | ICD-10-CM | POA: Diagnosis not present

## 2022-06-29 DIAGNOSIS — F0153 Vascular dementia, unspecified severity, with mood disturbance: Secondary | ICD-10-CM | POA: Diagnosis not present

## 2022-06-29 DIAGNOSIS — I69318 Other symptoms and signs involving cognitive functions following cerebral infarction: Secondary | ICD-10-CM | POA: Diagnosis not present

## 2022-06-29 DIAGNOSIS — F32A Depression, unspecified: Secondary | ICD-10-CM | POA: Diagnosis not present

## 2022-06-29 DIAGNOSIS — E785 Hyperlipidemia, unspecified: Secondary | ICD-10-CM | POA: Diagnosis not present

## 2022-06-29 DIAGNOSIS — F0154 Vascular dementia, unspecified severity, with anxiety: Secondary | ICD-10-CM | POA: Diagnosis not present

## 2022-06-29 DIAGNOSIS — M80031D Age-related osteoporosis with current pathological fracture, right forearm, subsequent encounter for fracture with routine healing: Secondary | ICD-10-CM | POA: Diagnosis not present

## 2022-06-29 DIAGNOSIS — I69351 Hemiplegia and hemiparesis following cerebral infarction affecting right dominant side: Secondary | ICD-10-CM | POA: Diagnosis not present

## 2022-07-03 DIAGNOSIS — I69354 Hemiplegia and hemiparesis following cerebral infarction affecting left non-dominant side: Secondary | ICD-10-CM

## 2022-07-03 DIAGNOSIS — Z7982 Long term (current) use of aspirin: Secondary | ICD-10-CM

## 2022-07-03 DIAGNOSIS — Z9181 History of falling: Secondary | ICD-10-CM

## 2022-07-03 DIAGNOSIS — F0154 Vascular dementia, unspecified severity, with anxiety: Secondary | ICD-10-CM | POA: Diagnosis not present

## 2022-07-03 DIAGNOSIS — F32A Depression, unspecified: Secondary | ICD-10-CM | POA: Diagnosis not present

## 2022-07-03 DIAGNOSIS — F0153 Vascular dementia, unspecified severity, with mood disturbance: Secondary | ICD-10-CM | POA: Diagnosis not present

## 2022-07-03 DIAGNOSIS — M80031D Age-related osteoporosis with current pathological fracture, right forearm, subsequent encounter for fracture with routine healing: Secondary | ICD-10-CM

## 2022-07-03 DIAGNOSIS — Z7902 Long term (current) use of antithrombotics/antiplatelets: Secondary | ICD-10-CM

## 2022-07-03 DIAGNOSIS — Z95818 Presence of other cardiac implants and grafts: Secondary | ICD-10-CM

## 2022-07-03 DIAGNOSIS — D696 Thrombocytopenia, unspecified: Secondary | ICD-10-CM

## 2022-07-03 DIAGNOSIS — I10 Essential (primary) hypertension: Secondary | ICD-10-CM

## 2022-07-03 DIAGNOSIS — I69351 Hemiplegia and hemiparesis following cerebral infarction affecting right dominant side: Secondary | ICD-10-CM

## 2022-07-03 DIAGNOSIS — E785 Hyperlipidemia, unspecified: Secondary | ICD-10-CM

## 2022-07-03 DIAGNOSIS — I69318 Other symptoms and signs involving cognitive functions following cerebral infarction: Secondary | ICD-10-CM | POA: Diagnosis not present

## 2022-07-03 DIAGNOSIS — Z8616 Personal history of COVID-19: Secondary | ICD-10-CM

## 2022-07-03 DIAGNOSIS — K648 Other hemorrhoids: Secondary | ICD-10-CM

## 2022-07-04 DIAGNOSIS — E785 Hyperlipidemia, unspecified: Secondary | ICD-10-CM | POA: Diagnosis not present

## 2022-07-04 DIAGNOSIS — F0153 Vascular dementia, unspecified severity, with mood disturbance: Secondary | ICD-10-CM | POA: Diagnosis not present

## 2022-07-04 DIAGNOSIS — I69351 Hemiplegia and hemiparesis following cerebral infarction affecting right dominant side: Secondary | ICD-10-CM | POA: Diagnosis not present

## 2022-07-04 DIAGNOSIS — I10 Essential (primary) hypertension: Secondary | ICD-10-CM | POA: Diagnosis not present

## 2022-07-04 DIAGNOSIS — I69354 Hemiplegia and hemiparesis following cerebral infarction affecting left non-dominant side: Secondary | ICD-10-CM | POA: Diagnosis not present

## 2022-07-04 DIAGNOSIS — F32A Depression, unspecified: Secondary | ICD-10-CM | POA: Diagnosis not present

## 2022-07-04 DIAGNOSIS — M80031D Age-related osteoporosis with current pathological fracture, right forearm, subsequent encounter for fracture with routine healing: Secondary | ICD-10-CM | POA: Diagnosis not present

## 2022-07-04 DIAGNOSIS — I69318 Other symptoms and signs involving cognitive functions following cerebral infarction: Secondary | ICD-10-CM | POA: Diagnosis not present

## 2022-07-04 DIAGNOSIS — F0154 Vascular dementia, unspecified severity, with anxiety: Secondary | ICD-10-CM | POA: Diagnosis not present

## 2022-07-06 ENCOUNTER — Ambulatory Visit (INDEPENDENT_AMBULATORY_CARE_PROVIDER_SITE_OTHER): Payer: Medicare HMO | Admitting: Family Medicine

## 2022-07-06 ENCOUNTER — Other Ambulatory Visit (INDEPENDENT_AMBULATORY_CARE_PROVIDER_SITE_OTHER): Payer: Medicare HMO

## 2022-07-06 ENCOUNTER — Encounter: Payer: Self-pay | Admitting: Family Medicine

## 2022-07-06 VITALS — Temp 97.6°F | Ht 64.0 in | Wt 121.0 lb

## 2022-07-06 DIAGNOSIS — F0150 Vascular dementia without behavioral disturbance: Secondary | ICD-10-CM

## 2022-07-06 DIAGNOSIS — R42 Dizziness and giddiness: Secondary | ICD-10-CM | POA: Diagnosis not present

## 2022-07-06 DIAGNOSIS — R066 Hiccough: Secondary | ICD-10-CM

## 2022-07-06 DIAGNOSIS — R197 Diarrhea, unspecified: Secondary | ICD-10-CM

## 2022-07-06 DIAGNOSIS — R4586 Emotional lability: Secondary | ICD-10-CM

## 2022-07-06 DIAGNOSIS — Z8673 Personal history of transient ischemic attack (TIA), and cerebral infarction without residual deficits: Secondary | ICD-10-CM | POA: Diagnosis not present

## 2022-07-06 LAB — COMPREHENSIVE METABOLIC PANEL
ALT: 12 U/L (ref 0–35)
AST: 15 U/L (ref 0–37)
Albumin: 4.2 g/dL (ref 3.5–5.2)
Alkaline Phosphatase: 53 U/L (ref 39–117)
BUN: 18 mg/dL (ref 6–23)
CO2: 27 mEq/L (ref 19–32)
Calcium: 9.8 mg/dL (ref 8.4–10.5)
Chloride: 102 mEq/L (ref 96–112)
Creatinine, Ser: 0.89 mg/dL (ref 0.40–1.20)
GFR: 66.29 mL/min (ref >60.00)
Glucose, Bld: 105 mg/dL — ABNORMAL HIGH (ref 70–99)
Potassium: 4.1 mEq/L (ref 3.5–5.1)
Sodium: 137 mEq/L (ref 135–145)
Total Bilirubin: 0.3 mg/dL (ref 0.2–1.2)
Total Protein: 8.2 g/dL (ref 6.0–8.3)

## 2022-07-06 LAB — CBC WITH DIFFERENTIAL/PLATELET
Basophils Absolute: 0.1 10*3/uL (ref 0.0–0.1)
Basophils Relative: 0.8 % (ref 0.0–3.0)
Eosinophils Absolute: 0.1 10*3/uL (ref 0.0–0.7)
Eosinophils Relative: 1 % (ref 0.0–5.0)
HCT: 40 % (ref 36.0–46.0)
Hemoglobin: 12.6 g/dL (ref 12.0–15.0)
Lymphocytes Relative: 22.8 % (ref 12.0–46.0)
Lymphs Abs: 1.5 10*3/uL (ref 0.7–4.0)
MCHC: 31.4 g/dL (ref 30.0–36.0)
MCV: 90.6 fl (ref 78.0–100.0)
Monocytes Absolute: 0.6 10*3/uL (ref 0.1–1.0)
Monocytes Relative: 8.4 % (ref 3.0–12.0)
Neutro Abs: 4.5 10*3/uL (ref 1.4–7.7)
Neutrophils Relative %: 67 % (ref 43.0–77.0)
Platelets: 258 10*3/uL (ref 150.0–400.0)
RBC: 4.42 Mil/uL (ref 3.87–5.11)
RDW: 14.6 % (ref 11.5–15.5)
WBC: 6.7 10*3/uL (ref 4.0–10.5)

## 2022-07-06 LAB — T4, FREE: Free T4: 0.85 ng/dL (ref 0.60–1.60)

## 2022-07-06 LAB — POC URINALSYSI DIPSTICK (AUTOMATED)
Bilirubin, UA: NEGATIVE
Blood, UA: NEGATIVE
Glucose, UA: NEGATIVE
Ketones, UA: NEGATIVE
Leukocytes, UA: NEGATIVE
Nitrite, UA: NEGATIVE
Protein, UA: NEGATIVE
Spec Grav, UA: 1.025 (ref 1.010–1.025)
Urobilinogen, UA: 0.2 E.U./dL
pH, UA: 6 (ref 5.0–8.0)

## 2022-07-06 LAB — TSH: TSH: 1.24 u[IU]/mL (ref 0.35–5.50)

## 2022-07-06 NOTE — Progress Notes (Signed)
Subjective:     Patient ID: Jacqueline Bonilla, female    DOB: 20-May-1955, 67 y.o.   MRN: [id number]  Chief Complaint  Patient presents with   Dizziness    Was sitting on stool and got dizzy, states dizzy at any time    Dizziness Associated symptoms include headaches. Pertinent negatives include no abdominal pain, chest pain, chills, coughing, fever, nausea or vomiting.    Discussed the use of AI scribe software for clinical note transcription with the patient, who gave verbal consent to proceed.  History of Present Illness         C/o dizziness and headache x 2 days.  Feels unsteady walking.    Fell from sitting on a stool. Fell onto her backside. Did not hit her head. No LOC. Landed on carpet at home.   Hiccups intermittent x weeks.   1 day of diarrhea 2 days ago.   Added green tea and fruit 6-7 ounces per day.  Drinking plenty of water.     Health Maintenance Due  Topic Date Due   Zoster Vaccines- Shingrix (1 of 2) Never done    Past Medical History:  Diagnosis Date   Anemia    Anxiety    Depression    Dysmenorrhea    Endometriosis    Fibroid    Hypertension    Implantable loop recorder present 2021   Microhematuria    negative workup   Osteoporosis    Tachycardia    Thrombocytopenia (HCC)     Past Surgical History:  Procedure Laterality Date   BREAST BIOPSY     BUBBLE STUDY  12/08/2020   Procedure: BUBBLE STUDY;  Surgeon: Wendall Stade, MD;  Location: [facility];  Service: Cardiovascular;;   CESAREAN SECTION     hysteroscopic resection     implantable loop recorder implant  10/21/2019   Medtronic Reveal Linq model LNQ 22 ([place] [id number]) implantable loop recorder   IR CT HEAD LTD  12/01/2020   IR PERCUTANEOUS ART THROMBECTOMY/INFUSION INTRACRANIAL INC DIAG ANGIO  12/01/2020   IR US GUIDE VASC ACCESS RIGHT  12/01/2020   ORIF HUMERUS FRACTURE Left 07/31/2021   Procedure: OPEN REDUCTION INTERNAL FIXATION (ORIF) DISTAL HUMERUS FRACTURE;  Surgeon:  Roby Lofts, MD;  Location: [facility] OR;  Service: Orthopedics;  Laterality: Left;   RADIOLOGY WITH ANESTHESIA N/A 12/01/2020   Procedure: IR WITH ANESTHESIA - CODE STROKE;  Surgeon: Radiologist, Medication, MD;  Location: [facility] OR;  Service: Radiology;  Laterality: N/A;   TEE WITHOUT CARDIOVERSION N/A 12/08/2020   Procedure: TRANSESOPHAGEAL ECHOCARDIOGRAM (TEE);  Surgeon: Wendall Stade, MD;  Location: [facility] ENDOSCOPY;  Service: Cardiovascular;  Laterality: N/A;    Family History  Problem Relation Age of Onset   Cancer Father        non hodgkin lymphoma & skin   Heart attack Maternal Grandfather    Dementia Mother    Polymyositis Sister     Social History   Socioeconomic History   Marital status: Married    Spouse name: Actor   Number of children: 1   Years of education: Not on file   Highest education level: Not on file  Occupational History   Occupation: accounting  Tobacco Use   Smoking status: Never   Smokeless tobacco: Never  Vaping Use   Vaping Use: Never used  Substance and Sexual Activity   Alcohol use: Never   Drug use: Never   Sexual activity: Not Currently    Partners: Male  Comment: husband vasectomy  Other Topics Concern   Not on file  Social History Narrative   Lives with husband   Grandchildren - 1   Works - Biochemist, 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%   Gun in home - yes - secured      Right handed   Caffeine: maybe tea every now and then   Social Determinants of Corporate investment banker Strain: Low Risk  (08/28/2021)   Overall Financial Resource Strain (CARDIA)    Difficulty of Paying Living Expenses: Not hard at all  Food Insecurity: No Food Insecurity (08/28/2021)   Hunger Vital Sign    Worried About Running Out of Food in the Last Year: Never true    Ran Out of Food in the Last Year: Never true  Transportation Needs: No Transportation Needs (08/28/2021)   PRAPARE - Administrator, Civil Service (Medical): No    Lack of Transportation  (Non-Medical): No  Physical Activity: Sufficiently Active (08/28/2021)   Exercise Vital Sign    Days of Exercise per Week: 4 days    Minutes of Exercise per Session: 60 min  Stress: No Stress Concern Present (08/28/2021)   [employer] of Occupational Health - Occupational Stress Questionnaire    Feeling of Stress : Not at all  Social Connections: Moderately Integrated (08/28/2021)   Social Connection and Isolation Panel [NHANES]    Frequency of Communication with Friends and Family: More than three times a week    Frequency of Social Gatherings with Friends and Family: Never    Attends Religious Services: More than 4 times per year    Active Member of [employer] or Organizations: No    Attends Banker Meetings: Never    Marital Status: Married  Catering manager Violence: Not At Risk (08/28/2021)   Humiliation, Afraid, Rape, and Kick questionnaire    Fear of Current or Ex-Partner: No    Emotionally Abused: No    Physically Abused: No    Sexually Abused: No    Outpatient Medications Prior to Visit  Medication Sig Dispense Refill   alendronate (FOSAMAX) 70 MG tablet Take 1 tablet (70 mg total) by mouth every 7 (seven) days. Take with a full glass of water on an empty stomach. 12 tablet 3   Amino Acids Complex TABS Take 1 tablet by mouth daily.     aspirin EC 81 MG tablet Take 81 mg by mouth daily. Swallow whole.     BRILINTA 90 MG TABS tablet TAKE 1 TABLET(90 MG) BY MOUTH TWICE DAILY 180 tablet 3   CINNAMON PO Take 1 capsule by mouth daily.     Coenzyme Q10 (CO Q 10 PO) Take 1 capsule by mouth daily.     HYDROcodone-acetaminophen (NORCO) 5-325 MG tablet Take 1 tablet by mouth every 6 (six) hours as needed for moderate pain. 10 tablet 0   losartan (COZAAR) 25 MG tablet TAKE 1/2 TABLET BY MOUTH DAILY 90 tablet 1   MAGNESIUM PO Take 1 tablet by mouth at bedtime.     Multiple Vitamin (MULTIVITAMIN WITH MINERALS) TABS tablet Take 1 tablet by mouth daily.     Multiple  Vitamins-Minerals (ZINC PO) Take 1 tablet by mouth daily.     No facility-administered medications prior to visit.    Allergies  Allergen Reactions   Avelox [Moxifloxacin]    Ciprofloxacin Hives   Floraquin [Iodoquinol]     No cipro or others   Iohexol  Code: HIVES, Desc: HIVES S/P IVP MANY YRS AGO    Levaquin [Levofloxacin]    Plavix [Clopidogrel] Diarrhea   Quinolones    Shellfish Allergy Itching    Itching under the chin. Described by patient but not seen by husband.    Review of Systems  Constitutional:  Negative for chills and fever.  Eyes:  Negative for blurred vision and double vision.  Respiratory:  Negative for cough and shortness of breath.        Hiccups  Cardiovascular:  Negative for chest pain, palpitations and leg swelling.  Gastrointestinal:  Positive for diarrhea. Negative for abdominal pain, constipation, nausea and vomiting.  Genitourinary:  Negative for dysuria, frequency and urgency.  Musculoskeletal:  Positive for falls.  Neurological:  Positive for dizziness and headaches. Negative for sensory change, speech change, focal weakness and loss of consciousness.  Psychiatric/Behavioral:  Positive for memory loss.        Unchanged        Objective:    Physical Exam Constitutional:      General: She is not in acute distress.    Appearance: She is not ill-appearing.  HENT:     Head: Atraumatic.     Right Ear: Tympanic membrane and ear canal normal.     Left Ear: Tympanic membrane and ear canal normal.     Mouth/Throat:     Mouth: Mucous membranes are moist.     Pharynx: Oropharynx is clear.  Eyes:     General: Lids are normal. Vision grossly intact. No visual field deficit.    Extraocular Movements: Extraocular movements intact.     Conjunctiva/sclera: Conjunctivae normal.  Neck:     Thyroid: No thyromegaly or thyroid tenderness.     Trachea: Trachea normal.  Cardiovascular:     Rate and Rhythm: Normal rate and regular rhythm.  Pulmonary:      Effort: Pulmonary effort is normal.     Breath sounds: Normal breath sounds.  Musculoskeletal:        General: Normal range of motion.     Cervical back: Normal range of motion and neck supple. No tenderness. No spinous process tenderness or muscular tenderness. Normal range of motion.     Right lower leg: No edema.     Left lower leg: No edema.  Lymphadenopathy:     Cervical: No cervical adenopathy.  Skin:    General: Skin is warm and dry.  Neurological:     General: No focal deficit present.     Mental Status: She is alert. Mental status is at baseline.     Cranial Nerves: No facial asymmetry.     Sensory: Sensation is intact. No sensory deficit.     Motor: Motor function is intact. No weakness.     Coordination: Romberg sign negative. Coordination normal.     Gait: Gait abnormal.  Psychiatric:        Attention and Perception: Attention normal.        Mood and Affect: Mood normal.        Speech: Speech normal.        Behavior: Behavior is cooperative.      Temp 97.6 F (36.4 C) (Temporal)   Ht 5\' 4"  (1.626 m)   Wt 121 lb (54.9 kg)   LMP 01/02/2003   SpO2 98%   BMI 20.77 kg/m  Wt Readings from Last 3 Encounters:  07/06/22 121 lb (54.9 kg)  04/24/22 121 lb (54.9 kg)  01/23/22 122 lb 6 oz (55.5 kg)  Assessment & Plan:   Problem List Items Addressed This Visit       Nervous and Auditory   Vascular dementia without behavioral disturbance (HCC)   Relevant Orders   DG Chest 2 View     Other   History of stroke   Other Visit Diagnoses     Dizziness    -  Primary   Relevant Orders   POCT Urinalysis Dipstick (Automated) (Completed)   CBC with Differential/Platelet   Comprehensive metabolic panel   TSH   T4, free   DG Chest 2 View   Emotional lability       Relevant Orders   TSH   T4, free   DG Chest 2 View   Hiccups       Relevant Orders   DG Chest 2 View   Diarrhea, unspecified type       Relevant Orders   CBC with Differential/Platelet    Comprehensive metabolic panel      Borderline orthostatic with pulse 75 laying and 102 standing but not drop in BP.  UA negative.  Benign neurological exam  Check labs and chest XR  Advised to go to the ED or call 911 for any new or worsening symptoms.    I am having Hema P. Alfredo Bach "[name]" maintain her Multiple Vitamins-Minerals (ZINC PO), aspirin EC, multivitamin with minerals, CINNAMON PO, Amino Acids Complex, Coenzyme Q10 (CO Q 10 PO), MAGNESIUM PO, Brilinta, losartan, alendronate, and HYDROcodone-acetaminophen.  No orders of the defined types were placed in this encounter.

## 2022-07-06 NOTE — Patient Instructions (Signed)
Please go to [address] for labs and a chest x-ray.  If you notice any new or worsening symptoms (strokelike symptoms) over the weekend, please go to the emergency department or call 911.

## 2022-07-09 ENCOUNTER — Ambulatory Visit: Payer: Medicare HMO | Admitting: Emergency Medicine

## 2022-07-09 ENCOUNTER — Ambulatory Visit (INDEPENDENT_AMBULATORY_CARE_PROVIDER_SITE_OTHER): Payer: Medicare HMO

## 2022-07-09 DIAGNOSIS — F0154 Vascular dementia, unspecified severity, with anxiety: Secondary | ICD-10-CM | POA: Diagnosis not present

## 2022-07-09 DIAGNOSIS — I69318 Other symptoms and signs involving cognitive functions following cerebral infarction: Secondary | ICD-10-CM | POA: Diagnosis not present

## 2022-07-09 DIAGNOSIS — I69351 Hemiplegia and hemiparesis following cerebral infarction affecting right dominant side: Secondary | ICD-10-CM | POA: Diagnosis not present

## 2022-07-09 DIAGNOSIS — F0153 Vascular dementia, unspecified severity, with mood disturbance: Secondary | ICD-10-CM | POA: Diagnosis not present

## 2022-07-09 DIAGNOSIS — I639 Cerebral infarction, unspecified: Secondary | ICD-10-CM

## 2022-07-09 DIAGNOSIS — I10 Essential (primary) hypertension: Secondary | ICD-10-CM | POA: Diagnosis not present

## 2022-07-09 DIAGNOSIS — F32A Depression, unspecified: Secondary | ICD-10-CM | POA: Diagnosis not present

## 2022-07-09 DIAGNOSIS — I69354 Hemiplegia and hemiparesis following cerebral infarction affecting left non-dominant side: Secondary | ICD-10-CM | POA: Diagnosis not present

## 2022-07-09 DIAGNOSIS — M80031D Age-related osteoporosis with current pathological fracture, right forearm, subsequent encounter for fracture with routine healing: Secondary | ICD-10-CM | POA: Diagnosis not present

## 2022-07-09 DIAGNOSIS — E785 Hyperlipidemia, unspecified: Secondary | ICD-10-CM | POA: Diagnosis not present

## 2022-07-09 LAB — CUP PACEART REMOTE DEVICE CHECK
Date Time Interrogation Session: 20240705230211
Implantable Pulse Generator Implant Date: 20211020

## 2022-07-17 DIAGNOSIS — F0154 Vascular dementia, unspecified severity, with anxiety: Secondary | ICD-10-CM | POA: Diagnosis not present

## 2022-07-17 DIAGNOSIS — I69318 Other symptoms and signs involving cognitive functions following cerebral infarction: Secondary | ICD-10-CM | POA: Diagnosis not present

## 2022-07-17 DIAGNOSIS — F32A Depression, unspecified: Secondary | ICD-10-CM | POA: Diagnosis not present

## 2022-07-17 DIAGNOSIS — I69351 Hemiplegia and hemiparesis following cerebral infarction affecting right dominant side: Secondary | ICD-10-CM | POA: Diagnosis not present

## 2022-07-17 DIAGNOSIS — I69354 Hemiplegia and hemiparesis following cerebral infarction affecting left non-dominant side: Secondary | ICD-10-CM | POA: Diagnosis not present

## 2022-07-17 DIAGNOSIS — M80031D Age-related osteoporosis with current pathological fracture, right forearm, subsequent encounter for fracture with routine healing: Secondary | ICD-10-CM | POA: Diagnosis not present

## 2022-07-17 DIAGNOSIS — E785 Hyperlipidemia, unspecified: Secondary | ICD-10-CM | POA: Diagnosis not present

## 2022-07-17 DIAGNOSIS — I10 Essential (primary) hypertension: Secondary | ICD-10-CM | POA: Diagnosis not present

## 2022-07-17 DIAGNOSIS — F0153 Vascular dementia, unspecified severity, with mood disturbance: Secondary | ICD-10-CM | POA: Diagnosis not present

## 2022-07-24 ENCOUNTER — Telehealth: Payer: Self-pay | Admitting: Emergency Medicine

## 2022-07-24 ENCOUNTER — Other Ambulatory Visit: Payer: Self-pay | Admitting: *Deleted

## 2022-07-24 DIAGNOSIS — F0153 Vascular dementia, unspecified severity, with mood disturbance: Secondary | ICD-10-CM | POA: Diagnosis not present

## 2022-07-24 DIAGNOSIS — F32A Depression, unspecified: Secondary | ICD-10-CM | POA: Diagnosis not present

## 2022-07-24 DIAGNOSIS — Z8673 Personal history of transient ischemic attack (TIA), and cerebral infarction without residual deficits: Secondary | ICD-10-CM

## 2022-07-24 DIAGNOSIS — I10 Essential (primary) hypertension: Secondary | ICD-10-CM | POA: Diagnosis not present

## 2022-07-24 DIAGNOSIS — I69318 Other symptoms and signs involving cognitive functions following cerebral infarction: Secondary | ICD-10-CM | POA: Diagnosis not present

## 2022-07-24 DIAGNOSIS — E785 Hyperlipidemia, unspecified: Secondary | ICD-10-CM | POA: Diagnosis not present

## 2022-07-24 DIAGNOSIS — F0154 Vascular dementia, unspecified severity, with anxiety: Secondary | ICD-10-CM | POA: Diagnosis not present

## 2022-07-24 DIAGNOSIS — I69351 Hemiplegia and hemiparesis following cerebral infarction affecting right dominant side: Secondary | ICD-10-CM | POA: Diagnosis not present

## 2022-07-24 DIAGNOSIS — S42402D Unspecified fracture of lower end of left humerus, subsequent encounter for fracture with routine healing: Secondary | ICD-10-CM

## 2022-07-24 DIAGNOSIS — I69354 Hemiplegia and hemiparesis following cerebral infarction affecting left non-dominant side: Secondary | ICD-10-CM | POA: Diagnosis not present

## 2022-07-24 DIAGNOSIS — M80031D Age-related osteoporosis with current pathological fracture, right forearm, subsequent encounter for fracture with routine healing: Secondary | ICD-10-CM | POA: Diagnosis not present

## 2022-07-24 NOTE — Telephone Encounter (Signed)
Agree on referral as requested please.  Thanks.

## 2022-07-24 NOTE — Telephone Encounter (Signed)
[facility] called states they will be discharging patient soon and asked if we could start on the referral for outpatient physical therapy. States patient would like to go to [facility] and see Josefina Do. States patient gets discharged on the 5th and asked if the referral be started the week of the 12th and no sooner.

## 2022-07-24 NOTE — Progress Notes (Signed)
Carelink Summary Report / Loop Recorder 

## 2022-07-24 NOTE — Telephone Encounter (Signed)
Referral for outpatient PT has been placed

## 2022-07-30 DIAGNOSIS — F0154 Vascular dementia, unspecified severity, with anxiety: Secondary | ICD-10-CM | POA: Diagnosis not present

## 2022-07-30 DIAGNOSIS — I69351 Hemiplegia and hemiparesis following cerebral infarction affecting right dominant side: Secondary | ICD-10-CM | POA: Diagnosis not present

## 2022-07-30 DIAGNOSIS — M80031D Age-related osteoporosis with current pathological fracture, right forearm, subsequent encounter for fracture with routine healing: Secondary | ICD-10-CM | POA: Diagnosis not present

## 2022-07-30 DIAGNOSIS — F0153 Vascular dementia, unspecified severity, with mood disturbance: Secondary | ICD-10-CM | POA: Diagnosis not present

## 2022-07-30 DIAGNOSIS — E785 Hyperlipidemia, unspecified: Secondary | ICD-10-CM | POA: Diagnosis not present

## 2022-07-30 DIAGNOSIS — I69354 Hemiplegia and hemiparesis following cerebral infarction affecting left non-dominant side: Secondary | ICD-10-CM | POA: Diagnosis not present

## 2022-07-30 DIAGNOSIS — F32A Depression, unspecified: Secondary | ICD-10-CM | POA: Diagnosis not present

## 2022-07-30 DIAGNOSIS — I69318 Other symptoms and signs involving cognitive functions following cerebral infarction: Secondary | ICD-10-CM | POA: Diagnosis not present

## 2022-07-30 DIAGNOSIS — I10 Essential (primary) hypertension: Secondary | ICD-10-CM | POA: Diagnosis not present

## 2022-08-07 DIAGNOSIS — M80031D Age-related osteoporosis with current pathological fracture, right forearm, subsequent encounter for fracture with routine healing: Secondary | ICD-10-CM | POA: Diagnosis not present

## 2022-08-07 DIAGNOSIS — E785 Hyperlipidemia, unspecified: Secondary | ICD-10-CM | POA: Diagnosis not present

## 2022-08-07 DIAGNOSIS — I69354 Hemiplegia and hemiparesis following cerebral infarction affecting left non-dominant side: Secondary | ICD-10-CM | POA: Diagnosis not present

## 2022-08-07 DIAGNOSIS — F0154 Vascular dementia, unspecified severity, with anxiety: Secondary | ICD-10-CM | POA: Diagnosis not present

## 2022-08-07 DIAGNOSIS — I69351 Hemiplegia and hemiparesis following cerebral infarction affecting right dominant side: Secondary | ICD-10-CM | POA: Diagnosis not present

## 2022-08-07 DIAGNOSIS — I69318 Other symptoms and signs involving cognitive functions following cerebral infarction: Secondary | ICD-10-CM | POA: Diagnosis not present

## 2022-08-07 DIAGNOSIS — F0153 Vascular dementia, unspecified severity, with mood disturbance: Secondary | ICD-10-CM | POA: Diagnosis not present

## 2022-08-07 DIAGNOSIS — I10 Essential (primary) hypertension: Secondary | ICD-10-CM | POA: Diagnosis not present

## 2022-08-07 DIAGNOSIS — F32A Depression, unspecified: Secondary | ICD-10-CM | POA: Diagnosis not present

## 2022-08-08 DIAGNOSIS — M9901 Segmental and somatic dysfunction of cervical region: Secondary | ICD-10-CM | POA: Diagnosis not present

## 2022-08-08 DIAGNOSIS — M9904 Segmental and somatic dysfunction of sacral region: Secondary | ICD-10-CM | POA: Diagnosis not present

## 2022-08-08 DIAGNOSIS — M9903 Segmental and somatic dysfunction of lumbar region: Secondary | ICD-10-CM | POA: Diagnosis not present

## 2022-08-08 DIAGNOSIS — M9902 Segmental and somatic dysfunction of thoracic region: Secondary | ICD-10-CM | POA: Diagnosis not present

## 2022-08-13 ENCOUNTER — Ambulatory Visit (INDEPENDENT_AMBULATORY_CARE_PROVIDER_SITE_OTHER): Payer: Medicare HMO

## 2022-08-13 DIAGNOSIS — I639 Cerebral infarction, unspecified: Secondary | ICD-10-CM

## 2022-08-13 DIAGNOSIS — S6291XD Unspecified fracture of right wrist and hand, subsequent encounter for fracture with routine healing: Secondary | ICD-10-CM | POA: Diagnosis not present

## 2022-08-13 DIAGNOSIS — S42402D Unspecified fracture of lower end of left humerus, subsequent encounter for fracture with routine healing: Secondary | ICD-10-CM | POA: Diagnosis not present

## 2022-08-13 DIAGNOSIS — I69898 Other sequelae of other cerebrovascular disease: Secondary | ICD-10-CM | POA: Diagnosis not present

## 2022-08-15 ENCOUNTER — Telehealth: Payer: Self-pay | Admitting: Emergency Medicine

## 2022-08-15 NOTE — Telephone Encounter (Signed)
Prescription Request  08/15/2022  LOV: 04/24/2022  What is the name of the medication or equipment?  losartan (COZAAR) 25 MG tablet   Have you contacted your pharmacy to request a refill? No   Which pharmacy would you like this sent to?    [id number] [place], [place] - [address] AT [facility] [address] [name] [address] Phone: [phone] Fax: [phone]   Patient notified that their request is being sent to the clinical staff for review and that they should receive a response within 2 business days.   Please advise at Mobile [phone] (mobile)

## 2022-08-16 ENCOUNTER — Other Ambulatory Visit: Payer: Self-pay | Admitting: *Deleted

## 2022-08-16 MED ORDER — LOSARTAN POTASSIUM 25 MG PO TABS: 12.5000 mg | ORAL_TABLET | Freq: Every day | ORAL | 1 refills | Status: DC

## 2022-08-16 NOTE — Telephone Encounter (Signed)
New medication sent to patient pharmacy  

## 2022-08-21 DIAGNOSIS — S52571A Other intraarticular fracture of lower end of right radius, initial encounter for closed fracture: Secondary | ICD-10-CM | POA: Diagnosis not present

## 2022-08-21 DIAGNOSIS — S42402D Unspecified fracture of lower end of left humerus, subsequent encounter for fracture with routine healing: Secondary | ICD-10-CM | POA: Diagnosis not present

## 2022-08-21 DIAGNOSIS — I69898 Other sequelae of other cerebrovascular disease: Secondary | ICD-10-CM | POA: Diagnosis not present

## 2022-08-21 DIAGNOSIS — S6291XD Unspecified fracture of right wrist and hand, subsequent encounter for fracture with routine healing: Secondary | ICD-10-CM | POA: Diagnosis not present

## 2022-08-23 ENCOUNTER — Other Ambulatory Visit: Payer: Self-pay | Admitting: Emergency Medicine

## 2022-08-24 DIAGNOSIS — S42402D Unspecified fracture of lower end of left humerus, subsequent encounter for fracture with routine healing: Secondary | ICD-10-CM | POA: Diagnosis not present

## 2022-08-24 DIAGNOSIS — I69898 Other sequelae of other cerebrovascular disease: Secondary | ICD-10-CM | POA: Diagnosis not present

## 2022-08-24 DIAGNOSIS — S6291XD Unspecified fracture of right wrist and hand, subsequent encounter for fracture with routine healing: Secondary | ICD-10-CM | POA: Diagnosis not present

## 2022-08-27 NOTE — Progress Notes (Signed)
Carelink Summary Report / Loop Recorder 

## 2022-08-28 DIAGNOSIS — S6291XD Unspecified fracture of right wrist and hand, subsequent encounter for fracture with routine healing: Secondary | ICD-10-CM | POA: Diagnosis not present

## 2022-08-28 DIAGNOSIS — I69898 Other sequelae of other cerebrovascular disease: Secondary | ICD-10-CM | POA: Diagnosis not present

## 2022-08-28 DIAGNOSIS — S42402D Unspecified fracture of lower end of left humerus, subsequent encounter for fracture with routine healing: Secondary | ICD-10-CM | POA: Diagnosis not present

## 2022-08-30 DIAGNOSIS — S6291XD Unspecified fracture of right wrist and hand, subsequent encounter for fracture with routine healing: Secondary | ICD-10-CM | POA: Diagnosis not present

## 2022-08-30 DIAGNOSIS — S42402D Unspecified fracture of lower end of left humerus, subsequent encounter for fracture with routine healing: Secondary | ICD-10-CM | POA: Diagnosis not present

## 2022-08-30 DIAGNOSIS — I69898 Other sequelae of other cerebrovascular disease: Secondary | ICD-10-CM | POA: Diagnosis not present

## 2022-09-04 DIAGNOSIS — S42402D Unspecified fracture of lower end of left humerus, subsequent encounter for fracture with routine healing: Secondary | ICD-10-CM | POA: Diagnosis not present

## 2022-09-04 DIAGNOSIS — I69898 Other sequelae of other cerebrovascular disease: Secondary | ICD-10-CM | POA: Diagnosis not present

## 2022-09-04 DIAGNOSIS — S6291XD Unspecified fracture of right wrist and hand, subsequent encounter for fracture with routine healing: Secondary | ICD-10-CM | POA: Diagnosis not present

## 2022-09-06 DIAGNOSIS — I69898 Other sequelae of other cerebrovascular disease: Secondary | ICD-10-CM | POA: Diagnosis not present

## 2022-09-06 DIAGNOSIS — S6291XD Unspecified fracture of right wrist and hand, subsequent encounter for fracture with routine healing: Secondary | ICD-10-CM | POA: Diagnosis not present

## 2022-09-06 DIAGNOSIS — S42402D Unspecified fracture of lower end of left humerus, subsequent encounter for fracture with routine healing: Secondary | ICD-10-CM | POA: Diagnosis not present

## 2022-09-11 DIAGNOSIS — I69898 Other sequelae of other cerebrovascular disease: Secondary | ICD-10-CM | POA: Diagnosis not present

## 2022-09-11 DIAGNOSIS — S42402D Unspecified fracture of lower end of left humerus, subsequent encounter for fracture with routine healing: Secondary | ICD-10-CM | POA: Diagnosis not present

## 2022-09-11 DIAGNOSIS — S6291XD Unspecified fracture of right wrist and hand, subsequent encounter for fracture with routine healing: Secondary | ICD-10-CM | POA: Diagnosis not present

## 2022-09-13 DIAGNOSIS — S42402D Unspecified fracture of lower end of left humerus, subsequent encounter for fracture with routine healing: Secondary | ICD-10-CM | POA: Diagnosis not present

## 2022-09-13 DIAGNOSIS — I69898 Other sequelae of other cerebrovascular disease: Secondary | ICD-10-CM | POA: Diagnosis not present

## 2022-09-13 DIAGNOSIS — S6291XD Unspecified fracture of right wrist and hand, subsequent encounter for fracture with routine healing: Secondary | ICD-10-CM | POA: Diagnosis not present

## 2022-09-17 ENCOUNTER — Ambulatory Visit (INDEPENDENT_AMBULATORY_CARE_PROVIDER_SITE_OTHER): Payer: Medicare HMO

## 2022-09-17 DIAGNOSIS — I639 Cerebral infarction, unspecified: Secondary | ICD-10-CM

## 2022-09-17 LAB — CUP PACEART REMOTE DEVICE CHECK
Date Time Interrogation Session: 20240913230331
Implantable Pulse Generator Implant Date: 20211020

## 2022-09-24 DIAGNOSIS — I69898 Other sequelae of other cerebrovascular disease: Secondary | ICD-10-CM | POA: Diagnosis not present

## 2022-09-24 DIAGNOSIS — S42402D Unspecified fracture of lower end of left humerus, subsequent encounter for fracture with routine healing: Secondary | ICD-10-CM | POA: Diagnosis not present

## 2022-09-24 DIAGNOSIS — S6291XD Unspecified fracture of right wrist and hand, subsequent encounter for fracture with routine healing: Secondary | ICD-10-CM | POA: Diagnosis not present

## 2022-09-26 DIAGNOSIS — M9903 Segmental and somatic dysfunction of lumbar region: Secondary | ICD-10-CM | POA: Diagnosis not present

## 2022-09-26 DIAGNOSIS — I69898 Other sequelae of other cerebrovascular disease: Secondary | ICD-10-CM | POA: Diagnosis not present

## 2022-09-26 DIAGNOSIS — M9904 Segmental and somatic dysfunction of sacral region: Secondary | ICD-10-CM | POA: Diagnosis not present

## 2022-09-26 DIAGNOSIS — M9901 Segmental and somatic dysfunction of cervical region: Secondary | ICD-10-CM | POA: Diagnosis not present

## 2022-09-26 DIAGNOSIS — S42402D Unspecified fracture of lower end of left humerus, subsequent encounter for fracture with routine healing: Secondary | ICD-10-CM | POA: Diagnosis not present

## 2022-09-26 DIAGNOSIS — S6291XD Unspecified fracture of right wrist and hand, subsequent encounter for fracture with routine healing: Secondary | ICD-10-CM | POA: Diagnosis not present

## 2022-09-26 DIAGNOSIS — M9902 Segmental and somatic dysfunction of thoracic region: Secondary | ICD-10-CM | POA: Diagnosis not present

## 2022-10-01 NOTE — Progress Notes (Signed)
Carelink Summary Report / Loop Recorder 

## 2022-10-02 DIAGNOSIS — S42402D Unspecified fracture of lower end of left humerus, subsequent encounter for fracture with routine healing: Secondary | ICD-10-CM | POA: Diagnosis not present

## 2022-10-02 DIAGNOSIS — S6291XD Unspecified fracture of right wrist and hand, subsequent encounter for fracture with routine healing: Secondary | ICD-10-CM | POA: Diagnosis not present

## 2022-10-02 DIAGNOSIS — I69898 Other sequelae of other cerebrovascular disease: Secondary | ICD-10-CM | POA: Diagnosis not present

## 2022-10-04 DIAGNOSIS — S42402D Unspecified fracture of lower end of left humerus, subsequent encounter for fracture with routine healing: Secondary | ICD-10-CM | POA: Diagnosis not present

## 2022-10-04 DIAGNOSIS — I69898 Other sequelae of other cerebrovascular disease: Secondary | ICD-10-CM | POA: Diagnosis not present

## 2022-10-04 DIAGNOSIS — S6291XD Unspecified fracture of right wrist and hand, subsequent encounter for fracture with routine healing: Secondary | ICD-10-CM | POA: Diagnosis not present

## 2022-10-09 DIAGNOSIS — S42402D Unspecified fracture of lower end of left humerus, subsequent encounter for fracture with routine healing: Secondary | ICD-10-CM | POA: Diagnosis not present

## 2022-10-09 DIAGNOSIS — S6291XD Unspecified fracture of right wrist and hand, subsequent encounter for fracture with routine healing: Secondary | ICD-10-CM | POA: Diagnosis not present

## 2022-10-09 DIAGNOSIS — I69898 Other sequelae of other cerebrovascular disease: Secondary | ICD-10-CM | POA: Diagnosis not present

## 2022-10-11 DIAGNOSIS — I69898 Other sequelae of other cerebrovascular disease: Secondary | ICD-10-CM | POA: Diagnosis not present

## 2022-10-11 DIAGNOSIS — S42402D Unspecified fracture of lower end of left humerus, subsequent encounter for fracture with routine healing: Secondary | ICD-10-CM | POA: Diagnosis not present

## 2022-10-11 DIAGNOSIS — S6291XD Unspecified fracture of right wrist and hand, subsequent encounter for fracture with routine healing: Secondary | ICD-10-CM | POA: Diagnosis not present

## 2022-10-16 DIAGNOSIS — I69898 Other sequelae of other cerebrovascular disease: Secondary | ICD-10-CM | POA: Diagnosis not present

## 2022-10-16 DIAGNOSIS — S42402D Unspecified fracture of lower end of left humerus, subsequent encounter for fracture with routine healing: Secondary | ICD-10-CM | POA: Diagnosis not present

## 2022-10-16 DIAGNOSIS — S6291XD Unspecified fracture of right wrist and hand, subsequent encounter for fracture with routine healing: Secondary | ICD-10-CM | POA: Diagnosis not present

## 2022-10-16 DIAGNOSIS — S52571A Other intraarticular fracture of lower end of right radius, initial encounter for closed fracture: Secondary | ICD-10-CM | POA: Diagnosis not present

## 2022-10-18 DIAGNOSIS — S42402D Unspecified fracture of lower end of left humerus, subsequent encounter for fracture with routine healing: Secondary | ICD-10-CM | POA: Diagnosis not present

## 2022-10-18 DIAGNOSIS — S6291XD Unspecified fracture of right wrist and hand, subsequent encounter for fracture with routine healing: Secondary | ICD-10-CM | POA: Diagnosis not present

## 2022-10-18 DIAGNOSIS — I69898 Other sequelae of other cerebrovascular disease: Secondary | ICD-10-CM | POA: Diagnosis not present

## 2022-10-22 ENCOUNTER — Ambulatory Visit (INDEPENDENT_AMBULATORY_CARE_PROVIDER_SITE_OTHER): Payer: Medicare HMO

## 2022-10-22 DIAGNOSIS — I639 Cerebral infarction, unspecified: Secondary | ICD-10-CM | POA: Diagnosis not present

## 2022-10-22 LAB — CUP PACEART REMOTE DEVICE CHECK
Date Time Interrogation Session: 20241020230402
Implantable Pulse Generator Implant Date: 20211020

## 2022-10-23 ENCOUNTER — Ambulatory Visit: Payer: Medicare HMO

## 2022-10-23 VITALS — Ht 64.0 in | Wt 121.0 lb

## 2022-10-23 DIAGNOSIS — Z0000 Encounter for general adult medical examination without abnormal findings: Secondary | ICD-10-CM | POA: Diagnosis not present

## 2022-10-23 DIAGNOSIS — I69898 Other sequelae of other cerebrovascular disease: Secondary | ICD-10-CM | POA: Diagnosis not present

## 2022-10-23 DIAGNOSIS — S42402D Unspecified fracture of lower end of left humerus, subsequent encounter for fracture with routine healing: Secondary | ICD-10-CM | POA: Diagnosis not present

## 2022-10-23 DIAGNOSIS — S6291XD Unspecified fracture of right wrist and hand, subsequent encounter for fracture with routine healing: Secondary | ICD-10-CM | POA: Diagnosis not present

## 2022-10-23 NOTE — Progress Notes (Signed)
Subjective:   Jacqueline Bonilla is a 67 y.o. female who presents for Medicare Annual (Subsequent) preventive examination.  Visit Complete: Virtual I connected with  Jacqueline Bonilla on 10/23/22 by a audio enabled telemedicine application and verified that I am speaking with the correct person using two identifiers.  Patient Location: Home  Provider Location: Home Office  I discussed the limitations of evaluation and management by telemedicine. The patient expressed understanding and agreed to proceed.  Vital Signs: Because this visit was a virtual/telehealth visit, some criteria may be missing or patient reported. Any vitals not documented were not able to be obtained and vitals that have been documented are patient reported.  6CIT: not done today due to patient's condition.  PHQ9: not done today due to patient's condition.  Cardiac Risk Factors include: advanced age (>59men, >80 women);hypertension;dyslipidemia     Objective:    Today's Vitals   10/23/22 1451  Weight: 121 lb (54.9 kg)  Height: 5\' 4"  (1.626 m)   Body mass index is 20.77 kg/m.     10/23/2022    3:25 PM 04/06/2022    7:00 PM 10/19/2021   12:46 PM 08/28/2021   10:06 AM 07/31/2021    1:45 PM 02/10/2021   10:08 AM 12/08/2020    3:08 PM  Advanced Directives  Does Patient Have a Medical Advance Directive? No Yes No Yes Yes No No  Type of Advance Directive  Living will;Healthcare Power of [place];Out of facility DNR (pink MOST or yellow form)   Healthcare Power of [place];Living will    Does patient want to make changes to medical advance directive?     No - Patient declined    Copy of Healthcare Power of Attorney in Chart?     No - copy requested    Would patient like information on creating a medical advance directive?  No - Patient declined No - Patient declined    No - Patient declined    Current Medications (verified) Outpatient Encounter Medications as of 10/23/2022  Medication Sig   alendronate  (FOSAMAX) 70 MG tablet Take 1 tablet (70 mg total) by mouth every 7 (seven) days. Take with a full glass of water on an empty stomach.   Amino Acids Complex TABS Take 1 tablet by mouth daily.   aspirin EC 81 MG tablet Take 81 mg by mouth daily. Swallow whole.   BRILINTA 90 MG TABS tablet TAKE 1 TABLET(90 MG) BY MOUTH TWICE DAILY   CINNAMON PO Take 1 capsule by mouth daily.   Coenzyme Q10 (CO Q 10 PO) Take 1 capsule by mouth daily.   losartan (COZAAR) 25 MG tablet Take 0.5 tablets (12.5 mg total) by mouth daily.   MAGNESIUM PO Take 1 tablet by mouth at bedtime.   Multiple Vitamin (MULTIVITAMIN WITH MINERALS) TABS tablet Take 1 tablet by mouth daily.   Multiple Vitamins-Minerals (ZINC PO) Take 1 tablet by mouth daily.   HYDROcodone-acetaminophen (NORCO) 5-325 MG tablet Take 1 tablet by mouth every 6 (six) hours as needed for moderate pain. (Patient not taking: Reported on 10/23/2022)   No facility-administered encounter medications on file as of 10/23/2022.    Allergies (verified) Avelox [moxifloxacin], Ciprofloxacin, Floraquin [iodoquinol], Iohexol, Levaquin [levofloxacin], Plavix [clopidogrel], Quinolones, and Shellfish allergy   History: Past Medical History:  Diagnosis Date   Anemia    Anxiety    Depression    Dysmenorrhea    Endometriosis    Fibroid    Hypertension    Implantable loop recorder  present 2021   Microhematuria    negative workup   Osteoporosis    Tachycardia    Thrombocytopenia (HCC)    Past Surgical History:  Procedure Laterality Date   BREAST BIOPSY     BUBBLE STUDY  12/08/2020   Procedure: BUBBLE STUDY;  Surgeon: Wendall Stade, MD;  Location: [facility] ENDOSCOPY;  Service: Cardiovascular;;   CESAREAN SECTION     hysteroscopic resection     implantable loop recorder implant  10/21/2019   Medtronic Reveal Linq model LNQ 22 ([place] [id number]) implantable loop recorder   IR CT HEAD LTD  12/01/2020   IR PERCUTANEOUS ART THROMBECTOMY/INFUSION INTRACRANIAL INC DIAG  ANGIO  12/01/2020   IR US GUIDE VASC ACCESS RIGHT  12/01/2020   ORIF HUMERUS FRACTURE Left 07/31/2021   Procedure: OPEN REDUCTION INTERNAL FIXATION (ORIF) DISTAL HUMERUS FRACTURE;  Surgeon: Roby Lofts, MD;  Location: [facility] OR;  Service: Orthopedics;  Laterality: Left;   RADIOLOGY WITH ANESTHESIA N/A 12/01/2020   Procedure: IR WITH ANESTHESIA - CODE STROKE;  Surgeon: Radiologist, Medication, MD;  Location: [facility] OR;  Service: Radiology;  Laterality: N/A;   TEE WITHOUT CARDIOVERSION N/A 12/08/2020   Procedure: TRANSESOPHAGEAL ECHOCARDIOGRAM (TEE);  Surgeon: Wendall Stade, MD;  Location: [facility] ENDOSCOPY;  Service: Cardiovascular;  Laterality: N/A;   Family History  Problem Relation Age of Onset   Cancer Father        non hodgkin lymphoma & skin   Heart attack Maternal Grandfather    Dementia Mother    Polymyositis Sister    Social History   Socioeconomic History   Marital status: Married    Spouse name: Actor   Number of children: 1   Years of education: Not on file   Highest education level: Not on file  Occupational History   Occupation: Audiological scientist   Occupation: Retired  Tobacco Use   Smoking status: Never   Smokeless tobacco: Never  Vaping Use   Vaping status: Never Used  Substance and Sexual Activity   Alcohol use: Never   Drug use: Never   Sexual activity: Not Currently    Partners: Male    Comment: husband vasectomy  Other Topics Concern   Not on file  Social History Narrative   Lives with husband   Grandchildren - 1   Works - Biochemist, 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%   Gun in home - yes - secured      Right handed   Caffeine: maybe tea every now and then   Social Determinants of Corporate investment banker Strain: Low Risk  (10/23/2022)   Overall Financial Resource Strain (CARDIA)    Difficulty of Paying Living Expenses: Not hard at all  Food Insecurity: No Food Insecurity (10/23/2022)   Hunger Vital Sign    Worried About Running Out of Food in the Last Year: Never true     Ran Out of Food in the Last Year: Never true  Transportation Needs: No Transportation Needs (10/23/2022)   PRAPARE - Administrator, Civil Service (Medical): No    Lack of Transportation (Non-Medical): No  Physical Activity: Sufficiently Active (10/23/2022)   Exercise Vital Sign    Days of Exercise per Week: 6 days    Minutes of Exercise per Session: 40 min  Stress: No Stress Concern Present (08/28/2021)   [employer] of Occupational Health - Occupational Stress Questionnaire    Feeling of Stress : Not at all  Social Connections: Moderately Isolated (10/23/2022)  Social Advertising account executive [NHANES]    Frequency of Communication with Friends and Family: More than three times a week    Frequency of Social Gatherings with Friends and Family: Never    Attends Religious Services: Never    Database administrator or Organizations: No    Attends Engineer, structural: Never    Marital Status: Married    Tobacco Counseling Counseling given: Not Answered   Clinical Intake:  Pre-visit preparation completed: Yes  Pain : No/denies pain     BMI - recorded: 20.77 Nutritional Status: BMI of 19-24  Normal Nutritional Risks: None Diabetes: No  How often do you need to have someone help you when you read instructions, pamphlets, or other written materials from your doctor or pharmacy?: 5 - Always  Interpreter Needed?: Yes (Her husband did visit today)  Information entered by :: Jasmene Goswami   Activities of Daily Living    10/23/2022    3:27 PM  In your present state of health, do you have any difficulty performing the following activities:  Hearing? 0  Vision? 0  Difficulty concentrating or making decisions? 1  Walking or climbing stairs? 0  Dressing or bathing? 1  Doing errands, shopping? 0  Comment Husband drives her  Preparing Food and eating ? Y  Using the Toilet? N  In the past six months, have you accidently leaked urine? N  Do you  have problems with loss of bowel control? N  Managing your Medications? N  Managing your Finances? Y  Housekeeping or managing your Housekeeping? N    Patient Care Team: Georgina Quint, MD as PCP - General (Internal Medicine) de Hetty Ely, OD as Consulting Physician (Optometry)  Indicate any recent Medical Services you may have received from other than [facility] providers in the past year (date may be approximate).     Assessment:   This is a routine wellness examination for Wilhelminia.  Hearing/Vision screen Hearing Screening - Comments:: Denies hearing difficulties   Vision Screening - Comments:: Wears eyeglasses   Goals Addressed   None   Depression Screen    04/24/2022    9:22 AM 01/23/2022    2:04 PM 10/23/2021    2:23 PM 08/28/2021    9:58 AM 08/07/2021    2:39 PM 02/10/2021   10:07 AM 12/30/2020    9:29 AM  PHQ 2/9 Scores  PHQ - 2 Score 0 0 0 0 0 0 0  PHQ- 9 Score       4    Fall Risk    10/23/2022    3:25 PM 04/24/2022    9:21 AM 01/23/2022    2:03 PM 10/23/2021    2:22 PM 08/28/2021    9:52 AM  Fall Risk   Falls in the past year? 1 1 1 1 1   Number falls in past yr: 1 1 1 1  0  Injury with Fall? 1 1 0 1 1  Comment broke her arm broken wrist/ radius   broken arm  Risk for fall due to :  Impaired balance/gait;Other (Comment) Impaired balance/gait Impaired balance/gait History of fall(s)  Follow up Falls evaluation completed;Falls prevention discussed Falls evaluation completed Falls evaluation completed Falls evaluation completed Falls evaluation completed;Education provided    MEDICARE RISK AT HOME: Medicare Risk at Home Any stairs in or around the home?: Yes (3 steps) If so, are there any without handrails?: Yes Home free of loose throw rugs in walkways, pet beds, electrical cords, etc?: Yes Adequate  lighting in your home to reduce risk of falls?: Yes Life alert?: No Use of a cane, walker or w/c?: No Grab bars in the bathroom?: Yes Shower chair or  bench in shower?: Yes Elevated toilet seat or a handicapped toilet?: Yes  TIMED UP AND GO:  Was the test performed?  No    Cognitive Function:    04/12/2020    9:03 AM 06/11/2019    7:23 AM  MMSE - Mini Mental State Exam  Orientation to time 4 5  Orientation to Place 5 5  Registration 3 3  Attention/ Calculation 2 3  Recall 1 2  Language- name 2 objects 2 2  Language- repeat 1 1  Language- follow 3 step command 3 3  Language- read & follow direction 1 1  Write a sentence 1 0  Copy design 0 0  Total score 23 25        08/28/2021   10:07 AM  6CIT Screen  What Year? 0 points  What month? 0 points  What time? 3 points  Count back from 20 2 points  Months in reverse 2 points  Repeat phrase 2 points  Total Score 9 points    Immunizations Immunization History  Administered Date(s) Administered   Tdap 07/25/2011, 07/29/2021    TDAP status: Up to date  Flu Vaccine status: Declined, Education has been provided regarding the importance of this vaccine but patient still declined. Advised may receive this vaccine at local pharmacy or Health Dept. Aware to provide a copy of the vaccination record if obtained from local pharmacy or Health Dept. Verbalized acceptance and understanding.  Pneumococcal vaccine status: Declined,  Education has been provided regarding the importance of this vaccine but patient still declined. Advised may receive this vaccine at local pharmacy or Health Dept. Aware to provide a copy of the vaccination record if obtained from local pharmacy or Health Dept. Verbalized acceptance and understanding.   Covid-19 vaccine status: Declined, Education has been provided regarding the importance of this vaccine but patient still declined. Advised may receive this vaccine at local pharmacy or Health Dept.or vaccine clinic. Aware to provide a copy of the vaccination record if obtained from local pharmacy or Health Dept. Verbalized acceptance and  understanding.  Qualifies for Shingles Vaccine? Yes   Zostavax completed No   Shingrix Completed?: No.    Education has been provided regarding the importance of this vaccine. Patient has been advised to call insurance company to determine out of pocket expense if they have not yet received this vaccine. Advised may also receive vaccine at local pharmacy or Health Dept. Verbalized acceptance and understanding.  Screening Tests Health Maintenance  Topic Date Due   Zoster Vaccines- Shingrix (1 of 2) Never done   INFLUENZA VACCINE  Never done   Pneumonia Vaccine 11+ Years old (1 of 1 - PCV) 01/24/2023 (Originally 05/25/2018)   Fecal DNA (Cologuard)  11/12/2022   Medicare Annual Wellness (AWV)  10/23/2023   MAMMOGRAM  02/22/2024   DTaP/Tdap/Td (3 - Td or Tdap) 07/30/2031   DEXA SCAN  Completed   Hepatitis C Screening  Completed   HPV VACCINES  Aged Out   COVID-19 Vaccine  Discontinued    Health Maintenance  Health Maintenance Due  Topic Date Due   Zoster Vaccines- Shingrix (1 of 2) Never done   INFLUENZA VACCINE  Never done    Colorectal cancer screening: Type of screening: Cologuard. Completed 11/12/2019. Repeat every 3 years  Mammogram status: Completed 02/21/2022. Repeat every  year  Bone Density status: Completed 02/21/2022. Results reflect: Bone density results: OSTEOPENIA. Repeat every 2 years.  Lung Cancer Screening: (Low Dose CT Chest recommended if Age 54-80 years, 20 pack-year currently smoking OR have quit w/in 15years.) does not qualify.   Lung Cancer Screening Referral: N/A  Additional Screening:  Hepatitis C Screening: does qualify; Completed 08/09/2017  Vision Screening: Recommended annual ophthalmology exams for early detection of glaucoma and other disorders of the eye. Is the patient up to date with their annual eye exam?  Yes  Who is the provider or what is the name of the office in which the patient attends annual eye exams? Dr. Ali Lowe If pt is  not established with a provider, would they like to be referred to a provider to establish care? No .   Dental Screening: Recommended annual dental exams for proper oral hygiene    Community Resource Referral / Chronic Care Management: CRR required this visit?  No   CCM required this visit?  No     Plan:     I have personally reviewed and noted the following in the patient's chart:   Medical and social history Use of alcohol, tobacco or illicit drugs  Current medications and supplements including opioid prescriptions. Patient is not currently taking opioid prescriptions. Functional ability and status Nutritional status Physical activity Advanced directives List of other physicians Hospitalizations, surgeries, and ER visits in previous 12 months Vitals Screenings to include cognitive, depression, and falls Referrals and appointments  In addition, I have reviewed and discussed with patient certain preventive protocols, quality metrics, and best practice recommendations. A written personalized care plan for preventive services as well as general preventive health recommendations were provided to patient.     Julann Mcgilvray L Mordechai Matuszak, CMA   10/23/2022   After Visit Summary: (MyChart) Due to this being a telephonic visit, the after visit summary with patients personalized plan was offered to patient via MyChart   Nurse Notes: Patient's husband did visit for patient today due to patient's condition (Vascular dementia with behavior disturbance).  I did not do the PHQ9 and the 6CIT Score today due to her condition.  Patient husband declines all vaccines for patient.

## 2022-10-23 NOTE — Patient Instructions (Signed)
Jacqueline Bonilla , Thank you for taking time to come for your Medicare Wellness Visit. I appreciate your ongoing commitment to your health goals. Please review the following plan we discussed and let me know if I can assist you in the future.   Referrals/Orders/Follow-Ups/Clinician Recommendations: See Dr. Alvy Bimler for appoitment tomorrow.  Each day, aim for 6 glasses of water, plenty of protein in your diet and try to get up and walk/ stretch every hour for 5-10 minutes at a time.    This is a list of the screening recommended for you and due dates:  Health Maintenance  Topic Date Due   Zoster (Shingles) Vaccine (1 of 2) Never done   Flu Shot  Never done   Pneumonia Vaccine (1 of 1 - PCV) 01/24/2023*   Cologuard (Stool DNA test)  11/12/2022   Medicare Annual Wellness Visit  10/23/2023   Mammogram  02/22/2024   DTaP/Tdap/Td vaccine (3 - Td or Tdap) 07/30/2031   DEXA scan (bone density measurement)  Completed   Hepatitis C Screening  Completed   HPV Vaccine  Aged Out   COVID-19 Vaccine  Discontinued  *Topic was postponed. The date shown is not the original due date.    Advanced directives: (Copy Requested) Please bring a copy of your health care power of attorney and living will to the office to be added to your chart at your convenience.  Next Medicare Annual Wellness Visit scheduled for next year: No

## 2022-10-24 ENCOUNTER — Ambulatory Visit: Payer: Medicare HMO | Admitting: Emergency Medicine

## 2022-10-24 ENCOUNTER — Encounter: Payer: Self-pay | Admitting: Emergency Medicine

## 2022-10-24 VITALS — BP 102/78 | HR 92 | Temp 98.3°F | Ht 64.0 in | Wt 120.6 lb

## 2022-10-24 DIAGNOSIS — I10 Essential (primary) hypertension: Secondary | ICD-10-CM

## 2022-10-24 DIAGNOSIS — F0150 Vascular dementia without behavioral disturbance: Secondary | ICD-10-CM | POA: Diagnosis not present

## 2022-10-24 DIAGNOSIS — Z789 Other specified health status: Secondary | ICD-10-CM | POA: Diagnosis not present

## 2022-10-24 DIAGNOSIS — Z8673 Personal history of transient ischemic attack (TIA), and cerebral infarction without residual deficits: Secondary | ICD-10-CM

## 2022-10-24 NOTE — Assessment & Plan Note (Signed)
Stable chronic condition.  No concerns.

## 2022-10-24 NOTE — Assessment & Plan Note (Signed)
Well-controlled hypertension Continues losartan 12.5 mg daily

## 2022-10-24 NOTE — Assessment & Plan Note (Addendum)
Secondary prevention measures discussed Not diabetic Well-controlled hypertension Statin intolerant Continues daily baby aspirin and Brilinta 90 mg twice a day

## 2022-10-24 NOTE — Patient Instructions (Signed)
Health Maintenance After Age 69 After age 69, you are at a higher risk for certain long-term diseases and infections as well as injuries from falls. Falls are a major cause of broken bones and head injuries in people who are older than age 69. Getting regular preventive care can help to keep you healthy and well. Preventive care includes getting regular testing and making lifestyle changes as recommended by your health care provider. Talk with your health care provider about: Which screenings and tests you should have. A screening is a test that checks for a disease when you have no symptoms. A diet and exercise plan that is right for you. What should I know about screenings and tests to prevent falls? Screening and testing are the best ways to find a health problem early. Early diagnosis and treatment give you the best chance of managing medical conditions that are common after age 69. Certain conditions and lifestyle choices may make you more likely to have a fall. Your health care provider may recommend: Regular vision checks. Poor vision and conditions such as cataracts can make you more likely to have a fall. If you wear glasses, make sure to get your prescription updated if your vision changes. Medicine review. Work with your health care provider to regularly review all of the medicines you are taking, including over-the-counter medicines. Ask your health care provider about any side effects that may make you more likely to have a fall. Tell your health care provider if any medicines that you take make you feel dizzy or sleepy. Strength and balance checks. Your health care provider may recommend certain tests to check your strength and balance while standing, walking, or changing positions. Foot health exam. Foot pain and numbness, as well as not wearing proper footwear, can make you more likely to have a fall. Screenings, including: Osteoporosis screening. Osteoporosis is a condition that causes  the bones to get weaker and break more easily. Blood pressure screening. Blood pressure changes and medicines to control blood pressure can make you feel dizzy. Depression screening. You may be more likely to have a fall if you have a fear of falling, feel depressed, or feel unable to do activities that you used to do. Alcohol use screening. Using too much alcohol can affect your balance and may make you more likely to have a fall. Follow these instructions at home: Lifestyle Do not drink alcohol if: Your health care provider tells you not to drink. If you drink alcohol: Limit how much you have to: 0-1 drink a day for women. 0-2 drinks a day for men. Know how much alcohol is in your drink. In the [place] one drink equals one 12 oz bottle of beer (355 mL), one 5 oz glass of wine (148 mL), or one 1 oz glass of hard liquor (44 mL). Do not use any products that contain nicotine or tobacco. These products include cigarettes, chewing tobacco, and vaping devices, such as e-cigarettes. If you need help quitting, ask your health care provider. Activity  Follow a regular exercise program to stay fit. This will help you maintain your balance. Ask your health care provider what types of exercise are appropriate for you. If you need a cane or walker, use it as recommended by your health care provider. Wear supportive shoes that have nonskid soles. Safety  Remove any tripping hazards, such as rugs, cords, and clutter. Install safety equipment such as grab bars in bathrooms and safety rails on stairs. Keep rooms and walkways   well-lit. General instructions Talk with your health care provider about your risks for falling. Tell your health care provider if: You fall. Be sure to tell your health care provider about all falls, even ones that seem minor. You feel dizzy, tiredness (fatigue), or off-balance. Take over-the-counter and prescription medicines only as told by your health care provider. These include  supplements. Eat a healthy diet and maintain a healthy weight. A healthy diet includes low-fat dairy products, low-fat (lean) meats, and fiber from whole grains, beans, and lots of fruits and vegetables. Stay current with your vaccines. Schedule regular health, dental, and eye exams. Summary Having a healthy lifestyle and getting preventive care can help to protect your health and wellness after age 69. Screening and testing are the best way to find a health problem early and help you avoid having a fall. Early diagnosis and treatment give you the best chance for managing medical conditions that are more common for people who are older than age 69. Falls are a major cause of broken bones and head injuries in people who are older than age 69. Take precautions to prevent a fall at home. Work with your health care provider to learn what changes you can make to improve your health and wellness and to prevent falls. This information is not intended to replace advice given to you by your health care provider. Make sure you discuss any questions you have with your health care provider. Document Revised: 05/09/2020 Document Reviewed: 05/09/2020 Elsevier Patient Education  2024 [employer]  

## 2022-10-24 NOTE — Progress Notes (Signed)
Jacqueline Bonilla 67 y.o.   Chief Complaint  Patient presents with   Follow-up    6 month follow up. Notes of right wrist pain still being present. PT has been receiving injections, was informed that arthritis was developing in wrist    HISTORY OF PRESENT ILLNESS: This is a 67 y.o. female here for 73-month follow-up of chronic medical conditions. Accompanied by husband. Overall doing well. 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  Recently given corticosteroid injection last week. No other complaints or medical concerns today. Wt Readings from Last 3 Encounters:  10/24/22 120 lb 9.6 oz (54.7 kg)  10/23/22 121 lb (54.9 kg)  07/06/22 121 lb (54.9 kg)   BP Readings from Last 3 Encounters:  10/24/22 102/78  04/24/22 128/84  04/06/22 (!) 166/100     HPI   Prior to Admission medications   Medication Sig Start Date End Date Taking? Authorizing Provider  aspirin EC 81 MG tablet Take 81 mg by mouth daily. Swallow whole.   Yes [provider]  BRILINTA 90 MG TABS tablet TAKE 1 TABLET(90 MG) BY MOUTH TWICE DAILY 08/23/22  Yes Maggy Wyble, Eilleen Kempf, MD  CINNAMON PO Take 1 capsule by mouth daily.   Yes [provider]  losartan (COZAAR) 25 MG tablet Take 0.5 tablets (12.5 mg total) by mouth daily. 08/16/22  Yes Shamond Skelton, Eilleen Kempf, MD  MAGNESIUM PO Take 1 tablet by mouth at bedtime.   Yes [provider]  Multiple Vitamin (MULTIVITAMIN WITH MINERALS) TABS tablet Take 1 tablet by mouth daily.   Yes [provider]  Multiple Vitamins-Minerals (ZINC PO) Take 1 tablet by mouth daily.   Yes [provider]  alendronate (FOSAMAX) 70 MG tablet Take 1 tablet (70 mg total) by mouth every 7 (seven) days. Take with a full glass of water on an empty stomach. Patient not taking: Reported on 10/24/2022 02/21/22   Georgina Quint, MD  Amino Acids Complex TABS Take 1 tablet by mouth daily. Patient not taking: Reported on 10/24/2022    [provider]  Coenzyme Q10 (CO Q 10 PO) Take 1 capsule by mouth daily. Patient not taking: Reported on 10/24/2022    [provider]  HYDROcodone-acetaminophen (NORCO) 5-325 MG tablet Take 1 tablet by mouth every 6 (six) hours as needed for moderate pain. Patient not taking: Reported on 10/23/2022 04/06/22   Small, Brooke L, PA    Allergies  Allergen Reactions   Avelox [Moxifloxacin]    Ciprofloxacin Hives   Floraquin [Iodoquinol]     No cipro or others   Iohexol      Code: HIVES, Desc: HIVES S/P IVP MANY YRS AGO    Levaquin [Levofloxacin]    Plavix [Clopidogrel] Diarrhea   Quinolones    Shellfish Allergy Itching    Itching under the chin. Described by patient but not seen by husband.    Patient Active Problem List   Diagnosis Date Noted   Statin intolerance 04/24/2022   Anemia due to acute blood loss 08/07/2021   Internal hemorrhoids 01/10/2021   Dyslipidemia 12/29/2020   History of stroke 07/14/2020   Normocytic anemia 07/11/2020   Vascular dementia with behavior disturbance (HCC) 10/13/2019   Vascular dementia without behavioral disturbance (HCC) 07/15/2019   History of COVID-19 04/22/2019   Essential hypertension 03/03/2019    Past Medical History:  Diagnosis Date   Anemia    Anxiety    Depression    Dysmenorrhea    Endometriosis    Fibroid  Hypertension    Implantable loop recorder present 2021   Microhematuria    negative workup   Osteoporosis    Tachycardia    Thrombocytopenia (HCC)     Past Surgical History:  Procedure Laterality Date   BREAST BIOPSY     BUBBLE STUDY  12/08/2020   Procedure: BUBBLE STUDY;  Surgeon: Wendall Stade, MD;  Location: [facility] ENDOSCOPY;  Service: Cardiovascular;;   CESAREAN SECTION     hysteroscopic resection     implantable loop recorder implant  10/21/2019   Medtronic Reveal Linq model LNQ 22 ([place] [id number]) implantable loop recorder   IR CT HEAD LTD  12/01/2020   IR PERCUTANEOUS ART THROMBECTOMY/INFUSION  INTRACRANIAL INC DIAG ANGIO  12/01/2020   IR US GUIDE VASC ACCESS RIGHT  12/01/2020   ORIF HUMERUS FRACTURE Left 07/31/2021   Procedure: OPEN REDUCTION INTERNAL FIXATION (ORIF) DISTAL HUMERUS FRACTURE;  Surgeon: Roby Lofts, MD;  Location: [facility] OR;  Service: Orthopedics;  Laterality: Left;   RADIOLOGY WITH ANESTHESIA N/A 12/01/2020   Procedure: IR WITH ANESTHESIA - CODE STROKE;  Surgeon: Radiologist, Medication, MD;  Location: [facility] OR;  Service: Radiology;  Laterality: N/A;   TEE WITHOUT CARDIOVERSION N/A 12/08/2020   Procedure: TRANSESOPHAGEAL ECHOCARDIOGRAM (TEE);  Surgeon: Wendall Stade, MD;  Location: [facility] ENDOSCOPY;  Service: Cardiovascular;  Laterality: N/A;    Social History   Socioeconomic History   Marital status: Married    Spouse name: Actor   Number of children: 1   Years of education: Not on file   Highest education level: Not on file  Occupational History   Occupation: accounting   Occupation: Retired  Tobacco Use   Smoking status: Never   Smokeless tobacco: Never  Vaping Use   Vaping status: Never Used  Substance and Sexual Activity   Alcohol use: Never   Drug use: Never   Sexual activity: Not Currently    Partners: Male    Comment: husband vasectomy  Other Topics Concern   Not on file  Social History Narrative   Lives with husband   Grandchildren - 1   Works - Biochemist, 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%   Gun in home - yes - secured      Right handed   Caffeine: maybe tea every now and then   Social Determinants of Corporate investment banker Strain: Low Risk  (10/23/2022)   Overall Financial Resource Strain (CARDIA)    Difficulty of Paying Living Expenses: Not hard at all  Food Insecurity: No Food Insecurity (10/23/2022)   Hunger Vital Sign    Worried About Running Out of Food in the Last Year: Never true    Ran Out of Food in the Last Year: Never true  Transportation Needs: No Transportation Needs (10/23/2022)   PRAPARE - Scientist, research (physical sciences) (Medical): No    Lack of Transportation (Non-Medical): No  Physical Activity: Sufficiently Active (10/23/2022)   Exercise Vital Sign    Days of Exercise per Week: 6 days    Minutes of Exercise per Session: 40 min  Stress: No Stress Concern Present (08/28/2021)   [employer] of Occupational Health - Occupational Stress Questionnaire    Feeling of Stress : Not at all  Social Connections: Moderately Isolated (10/23/2022)   Social Connection and Isolation Panel [NHANES]    Frequency of Communication with Friends and Family: More than three times a week    Frequency of Social Gatherings with Friends and Family:  Never    Attends Religious Services: Never    Active Member of Clubs or Organizations: No    Attends Banker Meetings: Never    Marital Status: Married  Catering manager Violence: Not At Risk (10/23/2022)   Humiliation, Afraid, Rape, and Kick questionnaire    Fear of Current or Ex-Partner: No    Emotionally Abused: No    Physically Abused: No    Sexually Abused: No    Family History  Problem Relation Age of Onset   Cancer Father        non hodgkin lymphoma & skin   Heart attack Maternal Grandfather    Dementia Mother    Polymyositis Sister      Review of Systems  Constitutional: Negative.  Negative for chills and fever.  HENT: Negative.  Negative for congestion and sore throat.   Respiratory: Negative.  Negative for cough and shortness of breath.   Cardiovascular: Negative.  Negative for chest pain and palpitations.  Gastrointestinal:  Negative for abdominal pain, diarrhea, nausea and vomiting.  Genitourinary: Negative.  Negative for dysuria and hematuria.  Musculoskeletal:  Positive for joint pain.  Skin: Negative.  Negative for rash.  Neurological: Negative.  Negative for dizziness and headaches.    Vitals:   10/24/22 0945  BP: 102/78  Pulse: 92  Temp: 98.3 F (36.8 C)  SpO2: 99%    Physical Exam Vitals reviewed.   Constitutional:      Appearance: Normal appearance.  HENT:     Head: Normocephalic.     Mouth/Throat:     Mouth: Mucous membranes are moist.     Pharynx: Oropharynx is clear.  Eyes:     Extraocular Movements: Extraocular movements intact.     Pupils: Pupils are equal, round, and reactive to light.  Cardiovascular:     Rate and Rhythm: Normal rate and regular rhythm.     Pulses: Normal pulses.     Heart sounds: Normal heart sounds.  Pulmonary:     Effort: Pulmonary effort is normal.     Breath sounds: Normal breath sounds.  Abdominal:     Palpations: Abdomen is soft.     Tenderness: There is no abdominal tenderness.  Musculoskeletal:     Cervical back: No tenderness.     Comments: Arthritic changes right wrist  Lymphadenopathy:     Cervical: No cervical adenopathy.  Skin:    General: Skin is warm and dry.     Capillary Refill: Capillary refill takes less than 2 seconds.  Neurological:     General: No focal deficit present.     Mental Status: She is alert and oriented to person, place, and time.  Psychiatric:        Mood and Affect: Mood normal.        Behavior: Behavior normal.      ASSESSMENT & PLAN: A total of 44 minutes was spent with the patient and counseling/coordination of care regarding preparing for this visit, review of most recent office visit notes, review of multiple chronic medical conditions under management, review of all medications, review of most recent blood work results, education on nutrition, secondary stroke prevention measures, prognosis, documentation and need for follow-up.  Problem List Items Addressed This Visit       Cardiovascular and Mediastinum   Essential hypertension - Primary    Well-controlled hypertension Continues losartan 12.5 mg daily        Nervous and Auditory   Vascular dementia without behavioral disturbance (HCC)  Stable chronic condition.  No concerns.        Other   History of stroke    Secondary prevention  measures discussed Not diabetic Well-controlled hypertension Statin intolerant Continues daily baby aspirin and Brilinta 90 mg twice a day      Statin intolerance   Patient Instructions  Health Maintenance After Age 35 After age 39, you are at a higher risk for certain long-term diseases and infections as well as injuries from falls. Falls are a major cause of broken bones and head injuries in people who are older than age 73. Getting regular preventive care can help to keep you healthy and well. Preventive care includes getting regular testing and making lifestyle changes as recommended by your health care provider. Talk with your health care provider about: Which screenings and tests you should have. A screening is a test that checks for a disease when you have no symptoms. A diet and exercise plan that is right for you. What should I know about screenings and tests to prevent falls? Screening and testing are the best ways to find a health problem early. Early diagnosis and treatment give you the best chance of managing medical conditions that are common after age 54. Certain conditions and lifestyle choices may make you more likely to have a fall. Your health care provider may recommend: Regular vision checks. Poor vision and conditions such as cataracts can make you more likely to have a fall. If you wear glasses, make sure to get your prescription updated if your vision changes. Medicine review. Work with your health care provider to regularly review all of the medicines you are taking, including over-the-counter medicines. Ask your health care provider about any side effects that may make you more likely to have a fall. Tell your health care provider if any medicines that you take make you feel dizzy or sleepy. Strength and balance checks. Your health care provider may recommend certain tests to check your strength and balance while standing, walking, or changing positions. Foot health exam.  Foot pain and numbness, as well as not wearing proper footwear, can make you more likely to have a fall. Screenings, including: Osteoporosis screening. Osteoporosis is a condition that causes the bones to get weaker and break more easily. Blood pressure screening. Blood pressure changes and medicines to control blood pressure can make you feel dizzy. Depression screening. You may be more likely to have a fall if you have a fear of falling, feel depressed, or feel unable to do activities that you used to do. Alcohol use screening. Using too much alcohol can affect your balance and may make you more likely to have a fall. Follow these instructions at home: Lifestyle Do not drink alcohol if: Your health care provider tells you not to drink. If you drink alcohol: Limit how much you have to: 0-1 drink a day for women. 0-2 drinks a day for men. Know how much alcohol is in your drink. In the [place] one drink equals one 12 oz bottle of beer (355 mL), one 5 oz glass of wine (148 mL), or one 1 oz glass of hard liquor (44 mL). Do not use any products that contain nicotine or tobacco. These products include cigarettes, chewing tobacco, and vaping devices, such as e-cigarettes. If you need help quitting, ask your health care provider. Activity  Follow a regular exercise program to stay fit. This will help you maintain your balance. Ask your health care provider what types of exercise are  appropriate for you. If you need a cane or walker, use it as recommended by your health care provider. Wear supportive shoes that have nonskid soles. Safety  Remove any tripping hazards, such as rugs, cords, and clutter. Install safety equipment such as grab bars in bathrooms and safety rails on stairs. Keep rooms and walkways well-lit. General instructions Talk with your health care provider about your risks for falling. Tell your health care provider if: You fall. Be sure to tell your health care provider about all  falls, even ones that seem minor. You feel dizzy, tiredness (fatigue), or off-balance. Take over-the-counter and prescription medicines only as told by your health care provider. These include supplements. Eat a healthy diet and maintain a healthy weight. A healthy diet includes low-fat dairy products, low-fat (lean) meats, and fiber from whole grains, beans, and lots of fruits and vegetables. Stay current with your vaccines. Schedule regular health, dental, and eye exams. Summary Having a healthy lifestyle and getting preventive care can help to protect your health and wellness after age 51. Screening and testing are the best way to find a health problem early and help you avoid having a fall. Early diagnosis and treatment give you the best chance for managing medical conditions that are more common for people who are older than age 2. Falls are a major cause of broken bones and head injuries in people who are older than age 82. Take precautions to prevent a fall at home. Work with your health care provider to learn what changes you can make to improve your health and wellness and to prevent falls. This information is not intended to replace advice given to you by your health care provider. Make sure you discuss any questions you have with your health care provider. Document Revised: 05/09/2020 Document Reviewed: 05/09/2020 Elsevier Patient Education  2024 Elsevier Inc.    Edwina Barth, MD [facility]

## 2022-10-25 DIAGNOSIS — I69898 Other sequelae of other cerebrovascular disease: Secondary | ICD-10-CM | POA: Diagnosis not present

## 2022-10-25 DIAGNOSIS — S42402D Unspecified fracture of lower end of left humerus, subsequent encounter for fracture with routine healing: Secondary | ICD-10-CM | POA: Diagnosis not present

## 2022-10-25 DIAGNOSIS — S6291XD Unspecified fracture of right wrist and hand, subsequent encounter for fracture with routine healing: Secondary | ICD-10-CM | POA: Diagnosis not present

## 2022-10-27 ENCOUNTER — Other Ambulatory Visit: Payer: Self-pay | Admitting: Emergency Medicine

## 2022-10-30 DIAGNOSIS — S6291XD Unspecified fracture of right wrist and hand, subsequent encounter for fracture with routine healing: Secondary | ICD-10-CM | POA: Diagnosis not present

## 2022-10-30 DIAGNOSIS — I69898 Other sequelae of other cerebrovascular disease: Secondary | ICD-10-CM | POA: Diagnosis not present

## 2022-10-30 DIAGNOSIS — S42402D Unspecified fracture of lower end of left humerus, subsequent encounter for fracture with routine healing: Secondary | ICD-10-CM | POA: Diagnosis not present

## 2022-11-01 DIAGNOSIS — S6291XD Unspecified fracture of right wrist and hand, subsequent encounter for fracture with routine healing: Secondary | ICD-10-CM | POA: Diagnosis not present

## 2022-11-01 DIAGNOSIS — I69898 Other sequelae of other cerebrovascular disease: Secondary | ICD-10-CM | POA: Diagnosis not present

## 2022-11-01 DIAGNOSIS — S42402D Unspecified fracture of lower end of left humerus, subsequent encounter for fracture with routine healing: Secondary | ICD-10-CM | POA: Diagnosis not present

## 2022-11-06 DIAGNOSIS — S6291XD Unspecified fracture of right wrist and hand, subsequent encounter for fracture with routine healing: Secondary | ICD-10-CM | POA: Diagnosis not present

## 2022-11-06 DIAGNOSIS — I69898 Other sequelae of other cerebrovascular disease: Secondary | ICD-10-CM | POA: Diagnosis not present

## 2022-11-06 DIAGNOSIS — S42402D Unspecified fracture of lower end of left humerus, subsequent encounter for fracture with routine healing: Secondary | ICD-10-CM | POA: Diagnosis not present

## 2022-11-06 NOTE — Progress Notes (Signed)
Carelink Summary Report / Loop Recorder 

## 2022-11-08 DIAGNOSIS — S42402D Unspecified fracture of lower end of left humerus, subsequent encounter for fracture with routine healing: Secondary | ICD-10-CM | POA: Diagnosis not present

## 2022-11-08 DIAGNOSIS — S6291XD Unspecified fracture of right wrist and hand, subsequent encounter for fracture with routine healing: Secondary | ICD-10-CM | POA: Diagnosis not present

## 2022-11-08 DIAGNOSIS — I69898 Other sequelae of other cerebrovascular disease: Secondary | ICD-10-CM | POA: Diagnosis not present

## 2022-11-13 DIAGNOSIS — S42402D Unspecified fracture of lower end of left humerus, subsequent encounter for fracture with routine healing: Secondary | ICD-10-CM | POA: Diagnosis not present

## 2022-11-13 DIAGNOSIS — I69898 Other sequelae of other cerebrovascular disease: Secondary | ICD-10-CM | POA: Diagnosis not present

## 2022-11-13 DIAGNOSIS — S6291XD Unspecified fracture of right wrist and hand, subsequent encounter for fracture with routine healing: Secondary | ICD-10-CM | POA: Diagnosis not present

## 2022-11-15 DIAGNOSIS — S6291XD Unspecified fracture of right wrist and hand, subsequent encounter for fracture with routine healing: Secondary | ICD-10-CM | POA: Diagnosis not present

## 2022-11-15 DIAGNOSIS — I69898 Other sequelae of other cerebrovascular disease: Secondary | ICD-10-CM | POA: Diagnosis not present

## 2022-11-15 DIAGNOSIS — S42402D Unspecified fracture of lower end of left humerus, subsequent encounter for fracture with routine healing: Secondary | ICD-10-CM | POA: Diagnosis not present

## 2022-11-20 DIAGNOSIS — S6291XD Unspecified fracture of right wrist and hand, subsequent encounter for fracture with routine healing: Secondary | ICD-10-CM | POA: Diagnosis not present

## 2022-11-20 DIAGNOSIS — I69898 Other sequelae of other cerebrovascular disease: Secondary | ICD-10-CM | POA: Diagnosis not present

## 2022-11-20 DIAGNOSIS — S42402D Unspecified fracture of lower end of left humerus, subsequent encounter for fracture with routine healing: Secondary | ICD-10-CM | POA: Diagnosis not present

## 2022-11-21 DIAGNOSIS — M9904 Segmental and somatic dysfunction of sacral region: Secondary | ICD-10-CM | POA: Diagnosis not present

## 2022-11-21 DIAGNOSIS — M9902 Segmental and somatic dysfunction of thoracic region: Secondary | ICD-10-CM | POA: Diagnosis not present

## 2022-11-21 DIAGNOSIS — M9901 Segmental and somatic dysfunction of cervical region: Secondary | ICD-10-CM | POA: Diagnosis not present

## 2022-11-21 DIAGNOSIS — M9903 Segmental and somatic dysfunction of lumbar region: Secondary | ICD-10-CM | POA: Diagnosis not present

## 2022-11-22 DIAGNOSIS — S42402D Unspecified fracture of lower end of left humerus, subsequent encounter for fracture with routine healing: Secondary | ICD-10-CM | POA: Diagnosis not present

## 2022-11-22 DIAGNOSIS — I69898 Other sequelae of other cerebrovascular disease: Secondary | ICD-10-CM | POA: Diagnosis not present

## 2022-11-22 DIAGNOSIS — S6291XD Unspecified fracture of right wrist and hand, subsequent encounter for fracture with routine healing: Secondary | ICD-10-CM | POA: Diagnosis not present

## 2022-11-26 ENCOUNTER — Ambulatory Visit: Payer: Medicare HMO

## 2022-11-26 DIAGNOSIS — I639 Cerebral infarction, unspecified: Secondary | ICD-10-CM | POA: Diagnosis not present

## 2022-11-27 DIAGNOSIS — S42402D Unspecified fracture of lower end of left humerus, subsequent encounter for fracture with routine healing: Secondary | ICD-10-CM | POA: Diagnosis not present

## 2022-11-27 DIAGNOSIS — I69898 Other sequelae of other cerebrovascular disease: Secondary | ICD-10-CM | POA: Diagnosis not present

## 2022-11-27 DIAGNOSIS — S6291XD Unspecified fracture of right wrist and hand, subsequent encounter for fracture with routine healing: Secondary | ICD-10-CM | POA: Diagnosis not present

## 2022-11-28 LAB — CUP PACEART REMOTE DEVICE CHECK
Date Time Interrogation Session: 20241126110048
Implantable Pulse Generator Implant Date: 20211020

## 2022-12-04 DIAGNOSIS — S6291XD Unspecified fracture of right wrist and hand, subsequent encounter for fracture with routine healing: Secondary | ICD-10-CM | POA: Diagnosis not present

## 2022-12-04 DIAGNOSIS — I69898 Other sequelae of other cerebrovascular disease: Secondary | ICD-10-CM | POA: Diagnosis not present

## 2022-12-04 DIAGNOSIS — S42402D Unspecified fracture of lower end of left humerus, subsequent encounter for fracture with routine healing: Secondary | ICD-10-CM | POA: Diagnosis not present

## 2022-12-06 DIAGNOSIS — I69898 Other sequelae of other cerebrovascular disease: Secondary | ICD-10-CM | POA: Diagnosis not present

## 2022-12-06 DIAGNOSIS — S42402D Unspecified fracture of lower end of left humerus, subsequent encounter for fracture with routine healing: Secondary | ICD-10-CM | POA: Diagnosis not present

## 2022-12-06 DIAGNOSIS — S6291XD Unspecified fracture of right wrist and hand, subsequent encounter for fracture with routine healing: Secondary | ICD-10-CM | POA: Diagnosis not present

## 2022-12-11 DIAGNOSIS — I69898 Other sequelae of other cerebrovascular disease: Secondary | ICD-10-CM | POA: Diagnosis not present

## 2022-12-11 DIAGNOSIS — S6291XD Unspecified fracture of right wrist and hand, subsequent encounter for fracture with routine healing: Secondary | ICD-10-CM | POA: Diagnosis not present

## 2022-12-11 DIAGNOSIS — S42402D Unspecified fracture of lower end of left humerus, subsequent encounter for fracture with routine healing: Secondary | ICD-10-CM | POA: Diagnosis not present

## 2022-12-13 DIAGNOSIS — I69898 Other sequelae of other cerebrovascular disease: Secondary | ICD-10-CM | POA: Diagnosis not present

## 2022-12-13 DIAGNOSIS — S6291XD Unspecified fracture of right wrist and hand, subsequent encounter for fracture with routine healing: Secondary | ICD-10-CM | POA: Diagnosis not present

## 2022-12-13 DIAGNOSIS — S42402D Unspecified fracture of lower end of left humerus, subsequent encounter for fracture with routine healing: Secondary | ICD-10-CM | POA: Diagnosis not present

## 2022-12-17 DIAGNOSIS — S52571A Other intraarticular fracture of lower end of right radius, initial encounter for closed fracture: Secondary | ICD-10-CM | POA: Diagnosis not present

## 2022-12-18 DIAGNOSIS — S42402D Unspecified fracture of lower end of left humerus, subsequent encounter for fracture with routine healing: Secondary | ICD-10-CM | POA: Diagnosis not present

## 2022-12-18 DIAGNOSIS — S6291XD Unspecified fracture of right wrist and hand, subsequent encounter for fracture with routine healing: Secondary | ICD-10-CM | POA: Diagnosis not present

## 2022-12-18 DIAGNOSIS — I69898 Other sequelae of other cerebrovascular disease: Secondary | ICD-10-CM | POA: Diagnosis not present

## 2022-12-20 DIAGNOSIS — I69898 Other sequelae of other cerebrovascular disease: Secondary | ICD-10-CM | POA: Diagnosis not present

## 2022-12-20 DIAGNOSIS — S6291XD Unspecified fracture of right wrist and hand, subsequent encounter for fracture with routine healing: Secondary | ICD-10-CM | POA: Diagnosis not present

## 2022-12-20 DIAGNOSIS — S42402D Unspecified fracture of lower end of left humerus, subsequent encounter for fracture with routine healing: Secondary | ICD-10-CM | POA: Diagnosis not present

## 2022-12-20 NOTE — Addendum Note (Signed)
Addended by: Geralyn Flash D on: 12/20/2022 10:18 AM   Modules accepted: Orders

## 2022-12-20 NOTE — Progress Notes (Signed)
Carelink Summary Report / Loop Recorder 

## 2022-12-25 DIAGNOSIS — S42402D Unspecified fracture of lower end of left humerus, subsequent encounter for fracture with routine healing: Secondary | ICD-10-CM | POA: Diagnosis not present

## 2022-12-25 DIAGNOSIS — S6291XD Unspecified fracture of right wrist and hand, subsequent encounter for fracture with routine healing: Secondary | ICD-10-CM | POA: Diagnosis not present

## 2022-12-25 DIAGNOSIS — I69898 Other sequelae of other cerebrovascular disease: Secondary | ICD-10-CM | POA: Diagnosis not present

## 2022-12-31 ENCOUNTER — Ambulatory Visit (INDEPENDENT_AMBULATORY_CARE_PROVIDER_SITE_OTHER): Payer: Medicare HMO

## 2022-12-31 DIAGNOSIS — I639 Cerebral infarction, unspecified: Secondary | ICD-10-CM | POA: Diagnosis not present

## 2022-12-31 LAB — CUP PACEART REMOTE DEVICE CHECK
Date Time Interrogation Session: 20241229230457
Implantable Pulse Generator Implant Date: 20211020

## 2023-01-03 DIAGNOSIS — I69898 Other sequelae of other cerebrovascular disease: Secondary | ICD-10-CM | POA: Diagnosis not present

## 2023-01-03 DIAGNOSIS — S6291XD Unspecified fracture of right wrist and hand, subsequent encounter for fracture with routine healing: Secondary | ICD-10-CM | POA: Diagnosis not present

## 2023-01-03 DIAGNOSIS — S42402D Unspecified fracture of lower end of left humerus, subsequent encounter for fracture with routine healing: Secondary | ICD-10-CM | POA: Diagnosis not present

## 2023-01-08 DIAGNOSIS — I69898 Other sequelae of other cerebrovascular disease: Secondary | ICD-10-CM | POA: Diagnosis not present

## 2023-01-08 DIAGNOSIS — S6291XD Unspecified fracture of right wrist and hand, subsequent encounter for fracture with routine healing: Secondary | ICD-10-CM | POA: Diagnosis not present

## 2023-01-08 DIAGNOSIS — S42402D Unspecified fracture of lower end of left humerus, subsequent encounter for fracture with routine healing: Secondary | ICD-10-CM | POA: Diagnosis not present

## 2023-01-10 DIAGNOSIS — I69898 Other sequelae of other cerebrovascular disease: Secondary | ICD-10-CM | POA: Diagnosis not present

## 2023-01-10 DIAGNOSIS — S6291XD Unspecified fracture of right wrist and hand, subsequent encounter for fracture with routine healing: Secondary | ICD-10-CM | POA: Diagnosis not present

## 2023-01-10 DIAGNOSIS — S42402D Unspecified fracture of lower end of left humerus, subsequent encounter for fracture with routine healing: Secondary | ICD-10-CM | POA: Diagnosis not present

## 2023-01-15 DIAGNOSIS — I69898 Other sequelae of other cerebrovascular disease: Secondary | ICD-10-CM | POA: Diagnosis not present

## 2023-01-15 DIAGNOSIS — S6291XD Unspecified fracture of right wrist and hand, subsequent encounter for fracture with routine healing: Secondary | ICD-10-CM | POA: Diagnosis not present

## 2023-01-15 DIAGNOSIS — S42402D Unspecified fracture of lower end of left humerus, subsequent encounter for fracture with routine healing: Secondary | ICD-10-CM | POA: Diagnosis not present

## 2023-01-17 DIAGNOSIS — I69898 Other sequelae of other cerebrovascular disease: Secondary | ICD-10-CM | POA: Diagnosis not present

## 2023-01-17 DIAGNOSIS — S6291XD Unspecified fracture of right wrist and hand, subsequent encounter for fracture with routine healing: Secondary | ICD-10-CM | POA: Diagnosis not present

## 2023-01-17 DIAGNOSIS — S42402D Unspecified fracture of lower end of left humerus, subsequent encounter for fracture with routine healing: Secondary | ICD-10-CM | POA: Diagnosis not present

## 2023-01-22 DIAGNOSIS — S42402D Unspecified fracture of lower end of left humerus, subsequent encounter for fracture with routine healing: Secondary | ICD-10-CM | POA: Diagnosis not present

## 2023-01-22 DIAGNOSIS — I69898 Other sequelae of other cerebrovascular disease: Secondary | ICD-10-CM | POA: Diagnosis not present

## 2023-01-22 DIAGNOSIS — S6291XD Unspecified fracture of right wrist and hand, subsequent encounter for fracture with routine healing: Secondary | ICD-10-CM | POA: Diagnosis not present

## 2023-01-24 DIAGNOSIS — I69898 Other sequelae of other cerebrovascular disease: Secondary | ICD-10-CM | POA: Diagnosis not present

## 2023-01-24 DIAGNOSIS — S42402D Unspecified fracture of lower end of left humerus, subsequent encounter for fracture with routine healing: Secondary | ICD-10-CM | POA: Diagnosis not present

## 2023-01-24 DIAGNOSIS — S6291XD Unspecified fracture of right wrist and hand, subsequent encounter for fracture with routine healing: Secondary | ICD-10-CM | POA: Diagnosis not present

## 2023-01-29 DIAGNOSIS — S6291XD Unspecified fracture of right wrist and hand, subsequent encounter for fracture with routine healing: Secondary | ICD-10-CM | POA: Diagnosis not present

## 2023-01-29 DIAGNOSIS — S42402D Unspecified fracture of lower end of left humerus, subsequent encounter for fracture with routine healing: Secondary | ICD-10-CM | POA: Diagnosis not present

## 2023-01-29 DIAGNOSIS — I69898 Other sequelae of other cerebrovascular disease: Secondary | ICD-10-CM | POA: Diagnosis not present

## 2023-01-31 DIAGNOSIS — S6291XD Unspecified fracture of right wrist and hand, subsequent encounter for fracture with routine healing: Secondary | ICD-10-CM | POA: Diagnosis not present

## 2023-01-31 DIAGNOSIS — S42402D Unspecified fracture of lower end of left humerus, subsequent encounter for fracture with routine healing: Secondary | ICD-10-CM | POA: Diagnosis not present

## 2023-01-31 DIAGNOSIS — I69898 Other sequelae of other cerebrovascular disease: Secondary | ICD-10-CM | POA: Diagnosis not present

## 2023-02-04 ENCOUNTER — Ambulatory Visit: Payer: Medicare HMO

## 2023-02-04 DIAGNOSIS — I639 Cerebral infarction, unspecified: Secondary | ICD-10-CM

## 2023-02-04 LAB — CUP PACEART REMOTE DEVICE CHECK
Date Time Interrogation Session: 20250202230253
Implantable Pulse Generator Implant Date: 20211020

## 2023-02-07 ENCOUNTER — Telehealth: Payer: Self-pay

## 2023-02-07 DIAGNOSIS — I69898 Other sequelae of other cerebrovascular disease: Secondary | ICD-10-CM | POA: Diagnosis not present

## 2023-02-07 DIAGNOSIS — S42402D Unspecified fracture of lower end of left humerus, subsequent encounter for fracture with routine healing: Secondary | ICD-10-CM | POA: Diagnosis not present

## 2023-02-07 DIAGNOSIS — S6291XD Unspecified fracture of right wrist and hand, subsequent encounter for fracture with routine healing: Secondary | ICD-10-CM | POA: Diagnosis not present

## 2023-02-07 NOTE — Telephone Encounter (Signed)
 Okay to order more physical therapy as needed.  Thanks.

## 2023-02-07 NOTE — Telephone Encounter (Signed)
 Copied from CRM [id number]. Topic: Clinical - Medical Advice >> Feb 07, 2023  4:10 PM Adaysia C wrote: Reason for CRM: Patients husband called on patients behalf to inform the provider the patients physical therapist has strongly recommended the patient be scanned for an assessment for more physical therapy treatment. The patient has had 4 falls within 72 hours and struggled to get back up. Please contact patients husband([name]) to follow up on this [phone].

## 2023-02-08 ENCOUNTER — Other Ambulatory Visit: Payer: Self-pay | Admitting: Radiology

## 2023-02-08 NOTE — Telephone Encounter (Signed)
 Spoke with husband and scheduled appointment for Monday. Did advise if anything changes with her condition to please go to urgent care or the ED

## 2023-02-11 ENCOUNTER — Ambulatory Visit (INDEPENDENT_AMBULATORY_CARE_PROVIDER_SITE_OTHER): Payer: Medicare HMO | Admitting: Emergency Medicine

## 2023-02-11 ENCOUNTER — Ambulatory Visit (INDEPENDENT_AMBULATORY_CARE_PROVIDER_SITE_OTHER): Payer: Medicare HMO

## 2023-02-11 ENCOUNTER — Encounter: Payer: Self-pay | Admitting: Emergency Medicine

## 2023-02-11 VITALS — BP 128/84 | HR 98 | Temp 98.7°F | Ht 64.0 in | Wt 119.0 lb

## 2023-02-11 DIAGNOSIS — F0150 Vascular dementia without behavioral disturbance: Secondary | ICD-10-CM | POA: Diagnosis not present

## 2023-02-11 DIAGNOSIS — S79911A Unspecified injury of right hip, initial encounter: Secondary | ICD-10-CM | POA: Diagnosis not present

## 2023-02-11 DIAGNOSIS — I10 Essential (primary) hypertension: Secondary | ICD-10-CM | POA: Diagnosis not present

## 2023-02-11 DIAGNOSIS — Z8673 Personal history of transient ischemic attack (TIA), and cerebral infarction without residual deficits: Secondary | ICD-10-CM

## 2023-02-11 DIAGNOSIS — S7701XA Crushing injury of right hip, initial encounter: Secondary | ICD-10-CM

## 2023-02-11 DIAGNOSIS — R296 Repeated falls: Secondary | ICD-10-CM | POA: Diagnosis not present

## 2023-02-11 DIAGNOSIS — E785 Hyperlipidemia, unspecified: Secondary | ICD-10-CM | POA: Diagnosis not present

## 2023-02-11 LAB — COMPREHENSIVE METABOLIC PANEL
ALT: 16 U/L (ref 0–35)
AST: 20 U/L (ref 0–37)
Albumin: 4 g/dL (ref 3.5–5.2)
Alkaline Phosphatase: 57 U/L (ref 39–117)
BUN: 13 mg/dL (ref 6–23)
CO2: 28 meq/L (ref 19–32)
Calcium: 9.2 mg/dL (ref 8.4–10.5)
Chloride: 102 meq/L (ref 96–112)
Creatinine, Ser: 0.92 mg/dL (ref 0.40–1.20)
GFR: 63.44 mL/min (ref >60.00)
Glucose, Bld: 155 mg/dL — ABNORMAL HIGH (ref 70–99)
Potassium: 4.4 meq/L (ref 3.5–5.1)
Sodium: 138 meq/L (ref 135–145)
Total Bilirubin: 0.4 mg/dL (ref 0.2–1.2)
Total Protein: 7.9 g/dL (ref 6.0–8.3)

## 2023-02-11 LAB — LIPID PANEL
Cholesterol: 140 mg/dL (ref 0–200)
HDL: 42.2 mg/dL (ref >39.00)
LDL Cholesterol: 73 mg/dL (ref 0–99)
NonHDL: 98.24
Total CHOL/HDL Ratio: 3
Triglycerides: 128 mg/dL (ref 0.0–149.0)
VLDL: 25.6 mg/dL (ref 0.0–40.0)

## 2023-02-11 NOTE — Assessment & Plan Note (Signed)
 Negative x-ray.  No fracture.

## 2023-02-11 NOTE — Assessment & Plan Note (Signed)
Well-controlled hypertension Continues losartan 12.5 mg daily

## 2023-02-11 NOTE — Patient Instructions (Signed)
Preventing Falls and Fractures  Falls can be very serious, especially for older adults or people with osteoporosis  Falls can be caused by:  Tripping or slipping  Slow reflexes  Balance problems  Reduced muscle strength  Poor vision or a recent change in prescription  Illness and some medications (especially blood pressure pills, diuretics, heart medicines, muscle relaxants and sleep medications)  Drinking alcohol  To prevent falls outdoors:  Use a can or walker if needed  Wear rubber-soled shoes so you don't slip  DO NOT buy "shape up" shoes with rocker bottom soles if you have balance problems.  The thick soles and shape make it more difficult to keep your balance.  Put kitty litter or salt on icy sidewalks  Walk on the grass if the sidewalks are slick  Avoid walking on uneven ground whenever possible  T prevent falls indoors:  Keep rooms clutter-free, especially hallways, stairs and paths to light switches  Remove throw rugs  Install night lights, especially to and in the bathroom  Turn on lights before going downstairs  Keep a flashlight next to your bed  Buy a cordless phone to keep with you instead of jumping up to answer the phone  Install grab bars in the bathroom near the shower and toilet  Install rails on both sides of the stairs.  Make sure the stairs are well lit  Wear slippers with non-skid soles.  Do not walk around in stockings or socks  Balance problems and dizziness are not a normal part of growing older.  If you begin having balance problems or dizziness see your doctor.  Physical Therapy can help you with many balance problems, strengthening hip and leg muscles and with gait training.  To keep your bones healthy make sure you are getting enough calcium and Vitamin D each day.  Ask your doctor or pharmacist about supplements.  Regular weight-bearing exercise like walking, lifting weights or dancing can help strengthen bones and prevent  osteoporosis.  

## 2023-02-11 NOTE — Assessment & Plan Note (Signed)
 Intolerant to statins.

## 2023-02-11 NOTE — Assessment & Plan Note (Signed)
 Secondary prevention measures discussed Not diabetic Well-controlled hypertension Statin intolerant Continues daily baby aspirin  and Brilinta  90 mg twice a day Has not seen neurologist for follow-up in over a year. Referral placed today Recommend brain MRI for comparison

## 2023-02-11 NOTE — Progress Notes (Signed)
 Jacqueline Bonilla 68 y.o.   Chief Complaint  Patient presents with   Fall    Patient has had 4 falls in 72hrs. Patient last fall was Saturday has some discomfort in her neck or back . Husband states she was not able to get back up from the fall.     HISTORY OF PRESENT ILLNESS: This is a 68 y.o. female accompanied by husband concerned about 4 falls sustained within the last 72 hours. They happened while visiting family members in [place] over the weekend.  No significant injuries but has some pain to the right hip area. 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  HPI   Prior to Admission medications   Medication Sig Start Date End Date Taking? Authorizing Provider  Amino Acids Complex TABS Take 1 tablet by mouth daily.   Yes [provider]  aspirin  EC 81 MG tablet Take 81 mg by mouth daily. Swallow whole.   Yes [provider]  BRILINTA  90 MG TABS tablet TAKE 1 TABLET(90 MG) BY MOUTH TWICE DAILY 08/23/22  Yes Waino Mounsey, Isidro Margo, MD  CINNAMON PO Take 1 capsule by mouth daily.   Yes [provider]  losartan  (COZAAR ) 25 MG tablet TAKE 1/2 TABLET BY MOUTH DAILY 10/28/22  Yes Lafreda Casebeer, Isidro Margo, MD  MAGNESIUM PO Take 1 tablet by mouth at bedtime.   Yes [provider]  Multiple Vitamin (MULTIVITAMIN WITH MINERALS) TABS tablet Take 1 tablet by mouth daily.   Yes [provider]  Multiple Vitamins-Minerals (ZINC PO) Take 1 tablet by mouth daily.   Yes [provider]  alendronate  (FOSAMAX ) 70 MG tablet Take 1 tablet (70 mg total) by mouth every 7 (seven) days. Take with a full glass of water on an empty stomach. Patient not taking: Reported on 02/11/2023 02/21/22   Dianara Smullen Jose, MD  Coenzyme Q10 (CO Q 10 PO) Take 1 capsule by mouth daily. Patient not taking: Reported on 02/11/2023    [provider]    Allergies  Allergen Reactions   Avelox [Moxifloxacin]    Ciprofloxacin Hives   Floraquin [Iodoquinol]     No cipro or  others   Iohexol       Code: HIVES, Desc: HIVES S/P IVP MANY YRS AGO    Levaquin [Levofloxacin]    Plavix  [Clopidogrel ] Diarrhea   Quinolones    Shellfish Allergy Itching    Itching under the chin. Described by patient but not seen by husband.    Patient Active Problem List   Diagnosis Date Noted   Statin intolerance 04/24/2022   Anemia due to acute blood loss 08/07/2021   Internal hemorrhoids 01/10/2021   Dyslipidemia 12/29/2020   History of stroke 07/14/2020   Normocytic anemia 07/11/2020   Vascular dementia with behavior disturbance (HCC) 10/13/2019   Vascular dementia without behavioral disturbance (HCC) 07/15/2019   History of COVID-19 04/22/2019   Essential hypertension 03/03/2019    Past Medical History:  Diagnosis Date   Anemia    Anxiety    Depression    Dysmenorrhea    Endometriosis    Fibroid    Hypertension    Implantable loop recorder present 2021   Microhematuria    negative workup   Osteoporosis    Tachycardia    Thrombocytopenia (HCC)     Past Surgical History:  Procedure Laterality Date   BREAST BIOPSY     BUBBLE STUDY  12/08/2020   Procedure: BUBBLE STUDY;  Surgeon: Loyde Rule, MD;  Location: [facility]  ENDOSCOPY;  Service: Cardiovascular;;   CESAREAN SECTION     hysteroscopic resection     implantable loop recorder implant  10/21/2019   Medtronic Reveal Linq model LNQ 22 (SN [id number]) implantable loop recorder   IR CT HEAD LTD  12/01/2020   IR PERCUTANEOUS ART THROMBECTOMY/INFUSION INTRACRANIAL INC DIAG ANGIO  12/01/2020   IR US  GUIDE VASC ACCESS RIGHT  12/01/2020   ORIF HUMERUS FRACTURE Left 07/31/2021   Procedure: OPEN REDUCTION INTERNAL FIXATION (ORIF) DISTAL HUMERUS FRACTURE;  Surgeon: Laneta Pintos, MD;  Location: [facility] OR;  Service: Orthopedics;  Laterality: Left;   RADIOLOGY WITH ANESTHESIA N/A 12/01/2020   Procedure: IR WITH ANESTHESIA - CODE STROKE;  Surgeon: Radiologist, Medication, MD;  Location: [facility] OR;  Service: Radiology;  Laterality:  N/A;   TEE WITHOUT CARDIOVERSION N/A 12/08/2020   Procedure: TRANSESOPHAGEAL ECHOCARDIOGRAM (TEE);  Surgeon: Loyde Rule, MD;  Location: [facility] ENDOSCOPY;  Service: Cardiovascular;  Laterality: N/A;    Social History   Socioeconomic History   Marital status: Married    Spouse name: Actor   Number of children: 1   Years of education: Not on file   Highest education level: Not on file  Occupational History   Occupation: accounting   Occupation: Retired  Tobacco Use   Smoking status: Never   Smokeless tobacco: Never  Vaping Use   Vaping status: Never Used  Substance and Sexual Activity   Alcohol use: Never   Drug use: Never   Sexual activity: Not Currently    Partners: Male    Comment: husband vasectomy  Other Topics Concern   Not on file  Social History Narrative   Lives with husband   Grandchildren - 1   Works - Biochemist, 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%   Gun in home - yes - secured      Right handed   Caffeine: maybe tea every now and then   Social Drivers of [employer] Strain: Low Risk  (10/23/2022)   Overall Financial Resource Strain (CARDIA)    Difficulty of Paying Living Expenses: Not hard at all  Food Insecurity: No Food Insecurity (10/23/2022)   Hunger Vital Sign    Worried About Running Out of Food in the Last Year: Never true    Ran Out of Food in the Last Year: Never true  Transportation Needs: No Transportation Needs (10/23/2022)   PRAPARE - Administrator, Civil Service (Medical): No    Lack of Transportation (Non-Medical): No  Physical Activity: Sufficiently Active (10/23/2022)   Exercise Vital Sign    Days of Exercise per Week: 6 days    Minutes of Exercise per Session: 40 min  Stress: No Stress Concern Present (08/28/2021)   [employer] of Occupational Health - Occupational Stress Questionnaire    Feeling of Stress : Not at all  Social Connections: Moderately Isolated (10/23/2022)   Social Connection and Isolation Panel  [NHANES]    Frequency of Communication with Friends and Family: More than three times a week    Frequency of Social Gatherings with Friends and Family: Never    Attends Religious Services: Never    Database administrator or Organizations: No    Attends Banker Meetings: Never    Marital Status: Married  Catering manager Violence: Not At Risk (10/23/2022)   Humiliation, Afraid, Rape, and Kick questionnaire    Fear of Current or Ex-Partner: No    Emotionally Abused: No  Physically Abused: No    Sexually Abused: No    Family History  Problem Relation Age of Onset   Cancer Father        non hodgkin lymphoma & skin   Heart attack Maternal Grandfather    Dementia Mother    Polymyositis Sister      Review of Systems  Constitutional: Negative.  Negative for chills and fever.  HENT: Negative.  Negative for congestion and sore throat.   Respiratory: Negative.  Negative for cough and shortness of breath.   Cardiovascular: Negative.  Negative for chest pain and palpitations.  Gastrointestinal:  Negative for abdominal pain, diarrhea, nausea and vomiting.  Genitourinary: Negative.  Negative for dysuria and hematuria.  Skin: Negative.  Negative for rash.  Neurological: Negative.  Negative for dizziness and headaches.  All other systems reviewed and are negative.   Today's Vitals   02/11/23 1258  BP: 128/84  Pulse: 98  Temp: 98.7 F (37.1 C)  TempSrc: Oral  SpO2: 98%  Weight: 119 lb (54 kg)  Height: 5\' 4"  (1.626 m)   Body mass index is 20.43 kg/m.   Physical Exam Vitals reviewed.  Constitutional:      Appearance: Normal appearance.  HENT:     Head: Normocephalic.     Mouth/Throat:     Mouth: Mucous membranes are moist.     Pharynx: Oropharynx is clear.  Eyes:     Extraocular Movements: Extraocular movements intact.     Pupils: Pupils are equal, round, and reactive to light.  Cardiovascular:     Rate and Rhythm: Normal rate and regular rhythm.      Pulses: Normal pulses.     Heart sounds: Normal heart sounds.  Pulmonary:     Effort: Pulmonary effort is normal.     Breath sounds: Normal breath sounds.  Musculoskeletal:        General: No tenderness or deformity.     Cervical back: No tenderness.  Lymphadenopathy:     Cervical: No cervical adenopathy.  Skin:    General: Skin is warm and dry.     Capillary Refill: Capillary refill takes less than 2 seconds.  Neurological:     Mental Status: She is alert and oriented to person, place, and time. Mental status is at baseline.  Psychiatric:        Mood and Affect: Mood normal.        Behavior: Behavior normal.    DG HIP UNILAT W OR W/O PELVIS 2-3 VIEWS RIGHT Result Date: 02/11/2023 CLINICAL DATA:  Right hip injury EXAM: DG HIP (WITH OR WITHOUT PELVIS) 2-3V RIGHT COMPARISON:  None Available. FINDINGS: Frontal view of the pelvis as well as frontal and frogleg lateral views of the right hip are obtained. No acute fracture, subluxation, or dislocation. Joint spaces are well preserved. Sacroiliac joints are normal. There is a large amount of retained stool within the distal colon. IMPRESSION: 1. No acute displaced fracture. 2. Significant fecal retention. Electronically Signed   By: Bobbye Burrow M.D.   On: 02/11/2023 16:03     ASSESSMENT & PLAN: A total of 43 minutes was spent with the patient and counseling/coordination of care regarding preparing for this visit, review of most recent office visit notes, review of multiple chronic medical conditions and their management, review of all medications, review of most recent bloodwork results, review of health maintenance items, education on nutrition, prognosis, documentation, and need for follow up.  Problem List Items Addressed This Visit  Cardiovascular and Mediastinum   Essential hypertension   Well-controlled hypertension Continues losartan  12.5 mg daily        Nervous and Auditory   Vascular dementia without behavioral  disturbance (HCC)   Stable chronic condition. No concerns.         Other   History of CVA (cerebrovascular accident)   Secondary prevention measures discussed Not diabetic Well-controlled hypertension Statin intolerant Continues daily baby aspirin  and Brilinta  90 mg twice a day Has not seen neurologist for follow-up in over a year. Referral placed today Recommend brain MRI for comparison      Relevant Orders   Ambulatory referral to Neurology   Dyslipidemia   Intolerant to statins      Frequent falls - Primary   Very concerning.  No significant injuries from recent falls.  Patient refusing to use walker or cane.  Strongly encouraged to use walker.  At this point recommend physical therapy tailored to gait stability.  Referral placed today. Blood work done today. Needs evaluation by neurologist.  Referral placed today. Recommend repeat brain MRI for comparison and to rule out any new findings      Relevant Orders   PT gait training   Comprehensive metabolic panel (Completed)   CBC with Differential/Platelet   TSH   Vitamin B12   VITAMIN D  25 Hydroxy (Vit-D Deficiency, Fractures)   Hemoglobin A1c   Lipid panel (Completed)   Ambulatory referral to Neurology   Crushing injury of right hip   Negative x-ray.  No fracture.      Relevant Orders   DG HIP UNILAT W OR W/O PELVIS 2-3 VIEWS RIGHT (Completed)   Patient Instructions  Preventing Falls and Fractures  Falls can be very serious, especially for older adults or people with osteoporosis  Falls can be caused by: Tripping or slipping Slow reflexes Balance problems Reduced muscle strength Poor vision or a recent change in prescription Illness and some medications (especially blood pressure pills, diuretics, heart medicines, muscle relaxants and sleep medications) Drinking alcohol  To prevent falls outdoors: Use a can or walker if needed Wear rubber-soled shoes so you don't slip DO NOT buy "shape up" shoes with  rocker bottom soles if you have balance problems.  The thick soles and shape make it more difficult to keep your balance. Put kitty litter or salt on icy sidewalks Walk on the grass if the sidewalks are slick Avoid walking on uneven ground whenever possible  T prevent falls indoors: Keep rooms clutter-free, especially hallways, stairs and paths to light switches Remove throw rugs Install night lights, especially to and in the bathroom Turn on lights before going downstairs Keep a flashlight next to your bed Buy a cordless phone to keep with you instead of jumping up to answer the phone Install grab bars in the bathroom near the shower and toilet Install rails on both sides of the stairs.  Make sure the stairs are well lit Wear slippers with non-skid soles.  Do not walk around in stockings or socks  Balance problems and dizziness are not a normal part of growing older.  If you begin having balance problems or dizziness see your doctor.  Physical Therapy can help you with many balance problems, strengthening hip and leg muscles and with gait training.  To keep your bones healthy make sure you are getting enough calcium  and Vitamin D  each day.  Ask your doctor or pharmacist about supplements.  Regular weight-bearing exercise like walking, lifting weights or dancing can help  strengthen bones and prevent osteoporosis.       Maryagnes Small, MD [facility]

## 2023-02-11 NOTE — Assessment & Plan Note (Signed)
Stable chronic condition.  No concerns.

## 2023-02-11 NOTE — Assessment & Plan Note (Addendum)
 Very concerning.  No significant injuries from recent falls.  Patient refusing to use walker or cane.  Strongly encouraged to use walker.  At this point recommend physical therapy tailored to gait stability.  Referral placed today. Blood work done today. Needs evaluation by neurologist.  Referral placed today. Recommend repeat brain MRI for comparison and to rule out any new findings

## 2023-02-12 DIAGNOSIS — S42402D Unspecified fracture of lower end of left humerus, subsequent encounter for fracture with routine healing: Secondary | ICD-10-CM | POA: Diagnosis not present

## 2023-02-12 DIAGNOSIS — S6291XD Unspecified fracture of right wrist and hand, subsequent encounter for fracture with routine healing: Secondary | ICD-10-CM | POA: Diagnosis not present

## 2023-02-12 DIAGNOSIS — I69898 Other sequelae of other cerebrovascular disease: Secondary | ICD-10-CM | POA: Diagnosis not present

## 2023-02-14 DIAGNOSIS — I69898 Other sequelae of other cerebrovascular disease: Secondary | ICD-10-CM | POA: Diagnosis not present

## 2023-02-14 DIAGNOSIS — S42402D Unspecified fracture of lower end of left humerus, subsequent encounter for fracture with routine healing: Secondary | ICD-10-CM | POA: Diagnosis not present

## 2023-02-14 DIAGNOSIS — S6291XD Unspecified fracture of right wrist and hand, subsequent encounter for fracture with routine healing: Secondary | ICD-10-CM | POA: Diagnosis not present

## 2023-02-19 ENCOUNTER — Ambulatory Visit: Payer: Medicare HMO | Admitting: Neurology

## 2023-02-19 ENCOUNTER — Encounter: Payer: Self-pay | Admitting: Neurology

## 2023-02-19 VITALS — BP 123/75 | HR 74 | Ht 64.0 in | Wt 119.0 lb

## 2023-02-19 DIAGNOSIS — I69898 Other sequelae of other cerebrovascular disease: Secondary | ICD-10-CM | POA: Diagnosis not present

## 2023-02-19 DIAGNOSIS — G3184 Mild cognitive impairment, so stated: Secondary | ICD-10-CM

## 2023-02-19 DIAGNOSIS — S6291XD Unspecified fracture of right wrist and hand, subsequent encounter for fracture with routine healing: Secondary | ICD-10-CM | POA: Diagnosis not present

## 2023-02-19 DIAGNOSIS — S42402D Unspecified fracture of lower end of left humerus, subsequent encounter for fracture with routine healing: Secondary | ICD-10-CM | POA: Diagnosis not present

## 2023-02-19 DIAGNOSIS — G214 Vascular parkinsonism: Secondary | ICD-10-CM

## 2023-02-19 DIAGNOSIS — Z8673 Personal history of transient ischemic attack (TIA), and cerebral infarction without residual deficits: Secondary | ICD-10-CM

## 2023-02-19 DIAGNOSIS — R296 Repeated falls: Secondary | ICD-10-CM

## 2023-02-19 DIAGNOSIS — F0150 Vascular dementia without behavioral disturbance: Secondary | ICD-10-CM

## 2023-02-19 NOTE — Patient Instructions (Addendum)
I had a long d/w patient and her husband about her increased recent falls, stroke memory loss and cognitive impairment , risk for recurrent stroke/TIAs, personally independently reviewed imaging studies and stroke evaluation results and answered questions.Continue aspirin 81 mg daily  for secondary stroke prevention and stopped Brilinta now as I see no benefit in long-term dual antiplatelet therapy for stroke prevention and maintain strict control of hypertension with blood pressure goal below 130/90, diabetes with hemoglobin A1c goal below 6.5% and lipids with LDL cholesterol goal below 70 mg/dL. I also advised the patient to eat a healthy diet with plenty of whole grains, cereals, fruits and vegetables, exercise regularly and maintain ideal body weight .patient is having frequent falls which may likely be multifactorial due to her history of multiple strokes and mild poststroke parkinsonism.  I advised her to get up slowly and avoid sudden movements.  She was advised to use a cane at all times while ambulating.  Continue current ongoing physical therapy.  Check dementia panel labs, EEG. ,  MRI scan of the brain. I encouraged the patient to increase participation in cognitively challenging activities like solving crossword puzzles, playing bridge and sudoku.  We also discussed memory compensation strategies.   Followup in the future with Dr. Lucia Gaskins in 3 months or call earlier if necessary  Fall Prevention in the Home, Adult Falls can cause injuries and affect people of all ages. There are many simple things that you can do to make your home safe and to help prevent falls. If you need it, ask for help making these changes. What actions can I take to prevent falls? General information Use good lighting in all rooms. Make sure to:  Replace any light bulbs that burn out. Turn on lights if it is dark and use night-lights. Keep items that you use often in easy-to-reach places. Lower the shelves around your home  if needed. Move furniture so that there are clear paths around it. Do not keep throw rugs or other things on the floor that can make you trip. If any of your floors are uneven, fix them. Add color or contrast paint or tape to clearly mark and help you see: Grab bars or handrails. First and last steps of staircases. Where the edge of each step is. If you use a ladder or stepladder: Make sure that it is fully opened. Do not climb a closed ladder. Make sure the sides of the ladder are locked in place. Have someone hold the ladder while you use it. Know where your pets are as you move through your home. What can I do in the bathroom?     Keep the floor dry. Clean up any water that is on the floor right away. Remove soap buildup in the bathtub or shower. Buildup makes bathtubs and showers slippery. Use non-skid mats or decals on the floor of the bathtub or shower. Attach bath mats securely with double-sided, non-slip rug tape. If you need to sit down while you are in the shower, use a non-slip stool. Install grab bars by the toilet and in the bathtub and shower. Do not use towel bars as grab bars. What can I do in the bedroom? Make sure that you have a light by your bed that is easy to reach. Do not use any sheets or blankets on your bed that hang to the floor. Have a firm bench or chair with side arms that you can use for support when you get dressed. What can I  do in the kitchen? Clean up any spills right away. If you need to reach something above you, use a sturdy step stool that has a grab bar. Keep electrical cables out of the way. Do not use floor polish or wax that makes floors slippery. What can I do with my stairs? Do not leave anything on the stairs. Make sure that you have a light switch at the top and the bottom of the stairs. Have them installed if you do not have them. Make sure that there are handrails on both sides of the stairs. Fix handrails that are broken or loose.  Make sure that handrails are as long as the staircases. Install non-slip stair treads on all stairs in your home if they do not have carpet. Avoid having throw rugs at the top or bottom of stairs, or secure the rugs with carpet tape to prevent them from moving. Choose a carpet design that does not hide the edge of steps on the stairs. Make sure that carpet is firmly attached to the stairs. Fix any carpet that is loose or worn. What can I do on the outside of my home? Use bright outdoor lighting. Repair the edges of walkways and driveways and fix any cracks. Clear paths of anything that can make you trip, such as tools or rocks. Add color or contrast paint or tape to clearly mark and help you see high doorway thresholds. Trim any bushes or trees on the main path into your home. Check that handrails are securely fastened and in good repair. Both sides of all steps should have handrails. Install guardrails along the edges of any raised decks or porches. Have leaves, snow, and ice cleared regularly. Use sand, salt, or ice melt on walkways during winter months if you live where there is ice and snow. In the garage, clean up any spills right away, including grease or oil spills. What other actions can I take? Review your medicines with your health care provider. Some medicines can make you confused or feel dizzy. This can increase your chance of falling. Wear closed-toe shoes that fit well and support your feet. Wear shoes that have rubber soles and low heels. Use a cane, walker, scooter, or crutches that help you move around if needed. Talk with your provider about other ways that you can decrease your risk of falls. This may include seeing a physical therapist to learn to do exercises to improve movement and strength. Where to find more information Centers for Disease Control and Prevention, [employer]: TonerPromos.no [employer] on Aging: BaseRingTones.pl National Institute on Aging: BaseRingTones.pl Contact a  health care provider if: You are afraid of falling at home. You feel weak, drowsy, or dizzy at home. You fall at home. Get help right away if you: Lose consciousness or have trouble moving after a fall. Have a fall that causes a head injury. These symptoms may be an emergency. Get help right away. Call 911. Do not wait to see if the symptoms will go away. Do not drive yourself to the hospital. This information is not intended to replace advice given to you by your health care provider. Make sure you discuss any questions you have with your health care provider. Document Revised: 08/21/2021 Document Reviewed: 08/21/2021 Elsevier Patient Education  2024 [employer].

## 2023-02-19 NOTE — Progress Notes (Addendum)
[facility] [address] [place]. [place] [address] [phone]       OFFICE FOLLOW-UP NOTE  [name] Date of Birth:  11/28/1955 Medical Record Number:  [id number]   HPI: Jacqueline Bonilla is a 68 year old Caucasian lady seen today accompanied by her husband.  She is referred by primary care physician for evaluation for of frequent falls.  Patient states she has had 5 falls over the last 6 weeks.  She has no warning about these falls.  .  She complains of feeling off balance and is not able to stand and hold herself and would fall if not caught.  In 2 of these she had bladder accidents.  She denies any accompanying double vision, vertigo extremity weakness or numbness.  Patient continues to have short-term memory difficulties and cognitive difficulties which appear apparently unchanged.  She does have some episodes of moodiness and lethargy but denies any delusions hallucinations, unsafe behavior or agitation.  She does complain of some increased drooling of saliva particularly at night but denies any tremors or bradykinesia.  She remains on aspirin and Brilinta which is tolerating well with minor bruising but no bleeding episodes.  She did have lab work on 02/11/2023 and complete metabolic panel and CBC were normal.  LDL cholesterol was 73 mg percent.  Loop recorder last interrogated on 02/03/2023 showed no evidence of atrial fibrillation.  She continues to have right-sided peripheral vision loss and mild right-sided weakness and stiffness from her stroke but her speech appears to have improved. 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  She has had a loop recorder and so far paroxysmal A-fib has not been found.  She has also had vascular and cognitive issues and has  seen Dr. Daisy Blossom in the office last visit on 02/21/2021 for the same and a Mini-Mental as previously ranged from 23-25 range over the last 3 years. ROS:   14 system review of systems is positive for memory difficulties, imbalance, dizziness, falls, moodiness, sleepiness, drooling all other systems negative  PMH:  Past Medical History:  Diagnosis Date   Anemia    Anxiety    Depression    Dysmenorrhea    Endometriosis    Fibroid    Hypertension    Implantable loop recorder present 2021   Microhematuria    negative workup   Osteoporosis    Tachycardia    Thrombocytopenia (HCC)     Social History:  Social History   Socioeconomic History   Marital status: Married    Spouse name: Actor   Number of children: 1   Years of education: Not on file   Highest education level: Not on file  Occupational History   Occupation: Audiological scientist   Occupation: Retired  Tobacco Use   Smoking status: Never   Smokeless tobacco: Never  Vaping Use   Vaping status: Never Used  Substance and Sexual Activity   Alcohol use: Never   Drug use: Never   Sexual activity: Not Currently    Partners: Male    Comment: husband vasectomy  Other Topics Concern   Not on file  Social History Narrative   Lives with husband   Grandchildren - 1   Works - Biochemist, 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%   Gun in home - yes -  secured      Right handed   Caffeine: maybe tea every now and then   Social Drivers of Health   Financial Resource Strain: Low Risk  (10/23/2022)   Overall Financial Resource Strain (CARDIA)    Difficulty of Paying Living Expenses: Not hard at all  Food Insecurity: No Food Insecurity (10/23/2022)   Hunger Vital Sign    Worried About Running Out of Food in the Last Year: Never true    Ran Out of Food in the Last Year: Never true  Transportation Needs: No Transportation Needs (10/23/2022)   PRAPARE - Administrator, Civil Service (Medical): No    Lack of Transportation (Non-Medical): No   Physical Activity: Sufficiently Active (10/23/2022)   Exercise Vital Sign    Days of Exercise per Week: 6 days    Minutes of Exercise per Session: 40 min  Stress: No Stress Concern Present (08/28/2021)   [employer] of Occupational Health - Occupational Stress Questionnaire    Feeling of Stress : Not at all  Social Connections: Moderately Isolated (10/23/2022)   Social Connection and Isolation Panel [NHANES]    Frequency of Communication with Friends and Family: More than three times a week    Frequency of Social Gatherings with Friends and Family: Never    Attends Religious Services: Never    Database administrator or Organizations: No    Attends Banker Meetings: Never    Marital Status: Married  Catering manager Violence: Not At Risk (10/23/2022)   Humiliation, Afraid, Rape, and Kick questionnaire    Fear of Current or Ex-Partner: No    Emotionally Abused: No    Physically Abused: No    Sexually Abused: No    Medications:   Current Outpatient Medications on File Prior to Visit  Medication Sig Dispense Refill   Amino Acids Complex TABS Take 1 tablet by mouth daily.     aspirin EC 81 MG tablet Take 81 mg by mouth daily. Swallow whole.     BRILINTA 90 MG TABS tablet TAKE 1 TABLET(90 MG) BY MOUTH TWICE DAILY 180 tablet 3   CINNAMON PO Take 1 capsule by mouth daily.     Coenzyme Q10 (CO Q 10 PO) Take 1 capsule by mouth daily.     losartan (COZAAR) 25 MG tablet TAKE 1/2 TABLET BY MOUTH DAILY 90 tablet 1   MAGNESIUM PO Take 1 tablet by mouth at bedtime.     Multiple Vitamin (MULTIVITAMIN WITH MINERALS) TABS tablet Take 1 tablet by mouth daily.     Multiple Vitamins-Minerals (ZINC PO) Take 1 tablet by mouth daily.     No current facility-administered medications on file prior to visit.    Allergies:   Allergies  Allergen Reactions   Avelox [Moxifloxacin]    Ciprofloxacin Hives   Floraquin [Iodoquinol]     No cipro or others   Iohexol      Code: HIVES,  Desc: HIVES S/P IVP MANY YRS AGO    Levaquin [Levofloxacin]    Plavix [Clopidogrel] Diarrhea   Quinolones    Shellfish Allergy Itching    Itching under the chin. Described by patient but not seen by husband.    Physical Exam General: well developed, well nourished pleasant elderly Caucasian lady, seated, in no evident distress Head: head normocephalic and atraumatic.  Neck: supple with no carotid or supraclavicular bruits Cardiovascular: regular rate and rhythm, no murmurs Musculoskeletal: no deformity Skin:  no rash/petichiae Vascular:  Normal pulses all extremities  Vitals:   02/19/23 1255  BP: 123/75  Pulse: 74   Neurologic Exam Mental Status: Awake and fully alert. Oriented to place and time. Recent and remote memory intact. Attention span, concentration and fund of knowledge appropriate. Mood and affect appropriate.  Slight diminished facial expression.  Positive glabellar tap. Cranial Nerves: Fundoscopic exam visual fields show diminished right upper quadrantanopsia  to confrontation. Hearing intact. Facial sensation intact.  Mild right lower facial asymmetry., tongue, palate moves normally and symmetrically.  Motor: Mild spastic hemiparesis on the right with 4/5 strength with weakness of right grip and intrinsic hand muscles.  Orbits left or right upper extremity.  Mild right hip flexor and ankle dorsiflexor weakness.  Tone is increased on the right with mild spasticity.  No resting or action tremor.  No cogwheel rigidity. Sensory.: intact to touch ,pinprick .position and vibratory sensation.  Coordination: Mildly impaired right finger-to-nose and needle coordination proportionate to the degree of weakness and spasticity. Gait and Station: Arises from chair with slight difficulty but no bradykinesia.. Stance is normal. Gait shows mild festination with short steps but then drags right leg.  Positive retropulsion and poor postural response to threat.  Slight diminished arm  swing Reflexes: 1+ and symmetric. Toes downgoing.   NIHSS  4 Modified Rankin  3    02/19/2023    1:35 PM 04/12/2020    9:03 AM 06/11/2019    7:23 AM  MMSE - Mini Mental State Exam  Orientation to time 4 4 5   Orientation to Place 5 5 5   Registration 3 3 3   Attention/ Calculation 1 2 3   Recall 0 1 2  Language- name 2 objects 2 2 2   Language- repeat 1 1 1   Language- follow 3 step command 3 3 3   Language- read & follow direction 1 1 1   Write a sentence 1 1 0  Copy design 0 0 0  Total score 21 23 25     IADL score 4 and Katz scale score 6 - highly independent    ASSESSMENT: 68 year old lady with left MCA infarct and 2022 with residual mild right spastic paresis with gait and balance difficulty with increase frequent falls likely multifactorial due to combination of mild poststroke parkinsonism's, spastic hemi-paresis and mild vascular dementia     PLAN: I had a long d/w patient and her husband about her increased recent falls, stroke memory loss and cognitive impairment , risk for recurrent stroke/TIAs, personally independently reviewed imaging studies and stroke evaluation results and answered questions.Continue aspirin 81 mg daily  for secondary stroke prevention and stopped Brilinta now as I see no benefit in long-term dual antiplatelet therapy for stroke prevention and maintain strict control of hypertension with blood pressure goal below 130/90, diabetes with hemoglobin A1c goal below 6.5% and lipids with LDL cholesterol goal below 70 mg/dL. I also advised the patient to eat a healthy diet with plenty of whole grains, cereals, fruits and vegetables, exercise regularly and maintain ideal body weight .patient is having frequent falls which may likely be multifactorial due to her history of multiple strokes and mild poststroke parkinsonism.  I advised her to get up slowly and avoid sudden movements.  She was advised to use a cane at all times while ambulating.  Continue current ongoing  physical therapy.  Check dementia panel labs, EEG. ,  MRI scan of the brain.  I encouraged the patient to increase participation in cognitively challenging activities like solving crossword puzzles, playing bridge and sudoku.  We also discussed memory  compensation strategies.  Followup in the future with Dr. Lucia Gaskins in 3 months or call earlier if necessary  Greater than 50% of time during this 45 minute visit was spent on counseling,explanation of diagnosis, planning of further management, discussion with patient and family and coordination of care Delia Heady, MD Note: This document was prepared with digital dictation and possible smart phrase technology. Any transcriptional errors that result from this process are unintentional

## 2023-02-20 ENCOUNTER — Encounter: Payer: Self-pay | Admitting: Neurology

## 2023-02-20 LAB — HEMOGLOBIN A1C
Est. average glucose Bld gHb Est-mCnc: 117 mg/dL
Hgb A1c MFr Bld: 5.7 % — ABNORMAL HIGH (ref 4.8–5.6)

## 2023-02-20 LAB — DEMENTIA PANEL
Homocysteine: 11.1 umol/L (ref 0.0–17.2)
RPR Ser Ql: NONREACTIVE
TSH: 0.857 u[IU]/mL (ref 0.450–4.500)
Vitamin B-12: 616 pg/mL (ref 232–1245)

## 2023-02-20 NOTE — Progress Notes (Signed)
Kindly inform the patient that screening test for diabetes, vitamin B12, thyroid hormone and test for syphilis were all normal.  1 lab result for homocystine is not back yet

## 2023-02-21 DIAGNOSIS — I69898 Other sequelae of other cerebrovascular disease: Secondary | ICD-10-CM | POA: Diagnosis not present

## 2023-02-21 DIAGNOSIS — S6291XD Unspecified fracture of right wrist and hand, subsequent encounter for fracture with routine healing: Secondary | ICD-10-CM | POA: Diagnosis not present

## 2023-02-21 DIAGNOSIS — S42402D Unspecified fracture of lower end of left humerus, subsequent encounter for fracture with routine healing: Secondary | ICD-10-CM | POA: Diagnosis not present

## 2023-02-26 DIAGNOSIS — S42402D Unspecified fracture of lower end of left humerus, subsequent encounter for fracture with routine healing: Secondary | ICD-10-CM | POA: Diagnosis not present

## 2023-02-26 DIAGNOSIS — I69898 Other sequelae of other cerebrovascular disease: Secondary | ICD-10-CM | POA: Diagnosis not present

## 2023-02-26 DIAGNOSIS — S6291XD Unspecified fracture of right wrist and hand, subsequent encounter for fracture with routine healing: Secondary | ICD-10-CM | POA: Diagnosis not present

## 2023-02-27 ENCOUNTER — Telehealth: Payer: Self-pay | Admitting: Neurology

## 2023-02-27 NOTE — Telephone Encounter (Signed)
 Jacqueline Bonilla: [id number] exp. 02/27/23-04/28/23 sent to [facility] [phone]

## 2023-02-28 DIAGNOSIS — S42402D Unspecified fracture of lower end of left humerus, subsequent encounter for fracture with routine healing: Secondary | ICD-10-CM | POA: Diagnosis not present

## 2023-02-28 DIAGNOSIS — S6291XD Unspecified fracture of right wrist and hand, subsequent encounter for fracture with routine healing: Secondary | ICD-10-CM | POA: Diagnosis not present

## 2023-02-28 DIAGNOSIS — I69898 Other sequelae of other cerebrovascular disease: Secondary | ICD-10-CM | POA: Diagnosis not present

## 2023-03-05 DIAGNOSIS — S6291XD Unspecified fracture of right wrist and hand, subsequent encounter for fracture with routine healing: Secondary | ICD-10-CM | POA: Diagnosis not present

## 2023-03-05 DIAGNOSIS — S42402D Unspecified fracture of lower end of left humerus, subsequent encounter for fracture with routine healing: Secondary | ICD-10-CM | POA: Diagnosis not present

## 2023-03-05 DIAGNOSIS — I69898 Other sequelae of other cerebrovascular disease: Secondary | ICD-10-CM | POA: Diagnosis not present

## 2023-03-07 ENCOUNTER — Ambulatory Visit (INDEPENDENT_AMBULATORY_CARE_PROVIDER_SITE_OTHER): Payer: Medicare HMO | Admitting: Neurology

## 2023-03-07 DIAGNOSIS — R4182 Altered mental status, unspecified: Secondary | ICD-10-CM | POA: Diagnosis not present

## 2023-03-07 DIAGNOSIS — G3184 Mild cognitive impairment, so stated: Secondary | ICD-10-CM

## 2023-03-07 DIAGNOSIS — S6291XD Unspecified fracture of right wrist and hand, subsequent encounter for fracture with routine healing: Secondary | ICD-10-CM | POA: Diagnosis not present

## 2023-03-07 DIAGNOSIS — I69898 Other sequelae of other cerebrovascular disease: Secondary | ICD-10-CM | POA: Diagnosis not present

## 2023-03-07 DIAGNOSIS — S42402D Unspecified fracture of lower end of left humerus, subsequent encounter for fracture with routine healing: Secondary | ICD-10-CM | POA: Diagnosis not present

## 2023-03-11 ENCOUNTER — Ambulatory Visit (INDEPENDENT_AMBULATORY_CARE_PROVIDER_SITE_OTHER): Payer: Medicare HMO

## 2023-03-11 DIAGNOSIS — I639 Cerebral infarction, unspecified: Secondary | ICD-10-CM | POA: Diagnosis not present

## 2023-03-11 LAB — CUP PACEART REMOTE DEVICE CHECK
Date Time Interrogation Session: 20250309230503
Implantable Pulse Generator Implant Date: 20211020

## 2023-03-12 DIAGNOSIS — M6281 Muscle weakness (generalized): Secondary | ICD-10-CM | POA: Diagnosis not present

## 2023-03-12 DIAGNOSIS — R2681 Unsteadiness on feet: Secondary | ICD-10-CM | POA: Diagnosis not present

## 2023-03-12 DIAGNOSIS — R2689 Other abnormalities of gait and mobility: Secondary | ICD-10-CM | POA: Diagnosis not present

## 2023-03-12 NOTE — Progress Notes (Signed)
 [facility] Summary Report / Loop Recorder

## 2023-03-12 NOTE — Progress Notes (Signed)
 Kindly inform the patient that EEG of brainwave study was normal.  No seizure activity noted.

## 2023-03-13 DIAGNOSIS — R2689 Other abnormalities of gait and mobility: Secondary | ICD-10-CM | POA: Diagnosis not present

## 2023-03-13 DIAGNOSIS — M6281 Muscle weakness (generalized): Secondary | ICD-10-CM | POA: Diagnosis not present

## 2023-03-13 DIAGNOSIS — R2681 Unsteadiness on feet: Secondary | ICD-10-CM | POA: Diagnosis not present

## 2023-03-14 DIAGNOSIS — S6291XD Unspecified fracture of right wrist and hand, subsequent encounter for fracture with routine healing: Secondary | ICD-10-CM | POA: Diagnosis not present

## 2023-03-14 DIAGNOSIS — I69898 Other sequelae of other cerebrovascular disease: Secondary | ICD-10-CM | POA: Diagnosis not present

## 2023-03-14 DIAGNOSIS — S42402D Unspecified fracture of lower end of left humerus, subsequent encounter for fracture with routine healing: Secondary | ICD-10-CM | POA: Diagnosis not present

## 2023-03-15 DIAGNOSIS — R2681 Unsteadiness on feet: Secondary | ICD-10-CM | POA: Diagnosis not present

## 2023-03-15 DIAGNOSIS — R2689 Other abnormalities of gait and mobility: Secondary | ICD-10-CM | POA: Diagnosis not present

## 2023-03-15 DIAGNOSIS — M6281 Muscle weakness (generalized): Secondary | ICD-10-CM | POA: Diagnosis not present

## 2023-03-19 DIAGNOSIS — M6281 Muscle weakness (generalized): Secondary | ICD-10-CM | POA: Diagnosis not present

## 2023-03-19 DIAGNOSIS — I69898 Other sequelae of other cerebrovascular disease: Secondary | ICD-10-CM | POA: Diagnosis not present

## 2023-03-19 DIAGNOSIS — R2681 Unsteadiness on feet: Secondary | ICD-10-CM | POA: Diagnosis not present

## 2023-03-19 DIAGNOSIS — S42402D Unspecified fracture of lower end of left humerus, subsequent encounter for fracture with routine healing: Secondary | ICD-10-CM | POA: Diagnosis not present

## 2023-03-19 DIAGNOSIS — R2689 Other abnormalities of gait and mobility: Secondary | ICD-10-CM | POA: Diagnosis not present

## 2023-03-19 DIAGNOSIS — S6291XD Unspecified fracture of right wrist and hand, subsequent encounter for fracture with routine healing: Secondary | ICD-10-CM | POA: Diagnosis not present

## 2023-03-20 DIAGNOSIS — R2689 Other abnormalities of gait and mobility: Secondary | ICD-10-CM | POA: Diagnosis not present

## 2023-03-20 DIAGNOSIS — M6281 Muscle weakness (generalized): Secondary | ICD-10-CM | POA: Diagnosis not present

## 2023-03-20 DIAGNOSIS — R2681 Unsteadiness on feet: Secondary | ICD-10-CM | POA: Diagnosis not present

## 2023-03-21 DIAGNOSIS — I69898 Other sequelae of other cerebrovascular disease: Secondary | ICD-10-CM | POA: Diagnosis not present

## 2023-03-21 DIAGNOSIS — S6291XD Unspecified fracture of right wrist and hand, subsequent encounter for fracture with routine healing: Secondary | ICD-10-CM | POA: Diagnosis not present

## 2023-03-21 DIAGNOSIS — S42402D Unspecified fracture of lower end of left humerus, subsequent encounter for fracture with routine healing: Secondary | ICD-10-CM | POA: Diagnosis not present

## 2023-03-22 DIAGNOSIS — M6281 Muscle weakness (generalized): Secondary | ICD-10-CM | POA: Diagnosis not present

## 2023-03-22 DIAGNOSIS — R2681 Unsteadiness on feet: Secondary | ICD-10-CM | POA: Diagnosis not present

## 2023-03-22 DIAGNOSIS — R2689 Other abnormalities of gait and mobility: Secondary | ICD-10-CM | POA: Diagnosis not present

## 2023-03-25 ENCOUNTER — Telehealth: Payer: Self-pay | Admitting: Neurology

## 2023-03-25 NOTE — Telephone Encounter (Signed)
 Pt's Jacqueline Bonilla calling to check if referral for imaging has been sent. Received a letter from [facility] has bee approved.

## 2023-03-26 DIAGNOSIS — M6281 Muscle weakness (generalized): Secondary | ICD-10-CM | POA: Diagnosis not present

## 2023-03-26 DIAGNOSIS — I69898 Other sequelae of other cerebrovascular disease: Secondary | ICD-10-CM | POA: Diagnosis not present

## 2023-03-26 DIAGNOSIS — R2681 Unsteadiness on feet: Secondary | ICD-10-CM | POA: Diagnosis not present

## 2023-03-26 DIAGNOSIS — R2689 Other abnormalities of gait and mobility: Secondary | ICD-10-CM | POA: Diagnosis not present

## 2023-03-26 DIAGNOSIS — S42402D Unspecified fracture of lower end of left humerus, subsequent encounter for fracture with routine healing: Secondary | ICD-10-CM | POA: Diagnosis not present

## 2023-03-26 DIAGNOSIS — S6291XD Unspecified fracture of right wrist and hand, subsequent encounter for fracture with routine healing: Secondary | ICD-10-CM | POA: Diagnosis not present

## 2023-03-27 ENCOUNTER — Inpatient Hospital Stay (HOSPITAL_COMMUNITY)
Admission: EM | Admit: 2023-03-27 | Discharge: 2023-04-01 | DRG: 062 | Disposition: A | Discharge status: Rehabilitation Facility | Attending: Neurology | Admitting: Neurology

## 2023-03-27 ENCOUNTER — Encounter (HOSPITAL_COMMUNITY): Payer: Self-pay | Admitting: Neurology

## 2023-03-27 ENCOUNTER — Other Ambulatory Visit: Payer: Self-pay

## 2023-03-27 ENCOUNTER — Inpatient Hospital Stay (HOSPITAL_COMMUNITY)

## 2023-03-27 ENCOUNTER — Emergency Department (HOSPITAL_COMMUNITY)

## 2023-03-27 DIAGNOSIS — I69319 Unspecified symptoms and signs involving cognitive functions following cerebral infarction: Secondary | ICD-10-CM | POA: Diagnosis not present

## 2023-03-27 DIAGNOSIS — F0154 Vascular dementia, unspecified severity, with anxiety: Secondary | ICD-10-CM | POA: Diagnosis not present

## 2023-03-27 DIAGNOSIS — Z8673 Personal history of transient ischemic attack (TIA), and cerebral infarction without residual deficits: Secondary | ICD-10-CM | POA: Diagnosis not present

## 2023-03-27 DIAGNOSIS — I69993 Ataxia following unspecified cerebrovascular disease: Secondary | ICD-10-CM | POA: Diagnosis not present

## 2023-03-27 DIAGNOSIS — R29810 Facial weakness: Secondary | ICD-10-CM | POA: Diagnosis present

## 2023-03-27 DIAGNOSIS — I69391 Dysphagia following cerebral infarction: Secondary | ICD-10-CM | POA: Diagnosis not present

## 2023-03-27 DIAGNOSIS — R270 Ataxia, unspecified: Secondary | ICD-10-CM | POA: Diagnosis present

## 2023-03-27 DIAGNOSIS — Z882 Allergy status to sulfonamides status: Secondary | ICD-10-CM

## 2023-03-27 DIAGNOSIS — Z807 Family history of other malignant neoplasms of lymphoid, hematopoietic and related tissues: Secondary | ICD-10-CM

## 2023-03-27 DIAGNOSIS — Z7902 Long term (current) use of antithrombotics/antiplatelets: Secondary | ICD-10-CM | POA: Diagnosis not present

## 2023-03-27 DIAGNOSIS — R479 Unspecified speech disturbances: Secondary | ICD-10-CM

## 2023-03-27 DIAGNOSIS — I69351 Hemiplegia and hemiparesis following cerebral infarction affecting right dominant side: Secondary | ICD-10-CM | POA: Diagnosis not present

## 2023-03-27 DIAGNOSIS — Z8249 Family history of ischemic heart disease and other diseases of the circulatory system: Secondary | ICD-10-CM | POA: Diagnosis not present

## 2023-03-27 DIAGNOSIS — M810 Age-related osteoporosis without current pathological fracture: Secondary | ICD-10-CM | POA: Diagnosis not present

## 2023-03-27 DIAGNOSIS — G319 Degenerative disease of nervous system, unspecified: Secondary | ICD-10-CM | POA: Diagnosis not present

## 2023-03-27 DIAGNOSIS — Z7409 Other reduced mobility: Secondary | ICD-10-CM | POA: Diagnosis present

## 2023-03-27 DIAGNOSIS — I63512 Cerebral infarction due to unspecified occlusion or stenosis of left middle cerebral artery: Principal | ICD-10-CM | POA: Diagnosis present

## 2023-03-27 DIAGNOSIS — I10 Essential (primary) hypertension: Secondary | ICD-10-CM | POA: Diagnosis not present

## 2023-03-27 DIAGNOSIS — R1310 Dysphagia, unspecified: Secondary | ICD-10-CM | POA: Diagnosis present

## 2023-03-27 DIAGNOSIS — H53461 Homonymous bilateral field defects, right side: Secondary | ICD-10-CM | POA: Diagnosis present

## 2023-03-27 DIAGNOSIS — Z91013 Allergy to seafood: Secondary | ICD-10-CM

## 2023-03-27 DIAGNOSIS — I480 Paroxysmal atrial fibrillation: Secondary | ICD-10-CM | POA: Diagnosis not present

## 2023-03-27 DIAGNOSIS — I6389 Other cerebral infarction: Secondary | ICD-10-CM

## 2023-03-27 DIAGNOSIS — Z881 Allergy status to other antibiotic agents status: Secondary | ICD-10-CM

## 2023-03-27 DIAGNOSIS — I639 Cerebral infarction, unspecified: Secondary | ICD-10-CM | POA: Diagnosis present

## 2023-03-27 DIAGNOSIS — I69321 Dysphasia following cerebral infarction: Secondary | ICD-10-CM | POA: Diagnosis not present

## 2023-03-27 DIAGNOSIS — R531 Weakness: Secondary | ICD-10-CM | POA: Diagnosis not present

## 2023-03-27 DIAGNOSIS — Z888 Allergy status to other drugs, medicaments and biological substances status: Secondary | ICD-10-CM

## 2023-03-27 DIAGNOSIS — R569 Unspecified convulsions: Secondary | ICD-10-CM | POA: Diagnosis not present

## 2023-03-27 DIAGNOSIS — R296 Repeated falls: Secondary | ICD-10-CM | POA: Diagnosis present

## 2023-03-27 DIAGNOSIS — R29818 Other symptoms and signs involving the nervous system: Secondary | ICD-10-CM | POA: Diagnosis not present

## 2023-03-27 DIAGNOSIS — Z79899 Other long term (current) drug therapy: Secondary | ICD-10-CM

## 2023-03-27 DIAGNOSIS — F1210 Cannabis abuse, uncomplicated: Secondary | ICD-10-CM | POA: Diagnosis not present

## 2023-03-27 DIAGNOSIS — R410 Disorientation, unspecified: Secondary | ICD-10-CM | POA: Diagnosis present

## 2023-03-27 DIAGNOSIS — R29704 NIHSS score 4: Secondary | ICD-10-CM | POA: Diagnosis not present

## 2023-03-27 DIAGNOSIS — F0153 Vascular dementia, unspecified severity, with mood disturbance: Secondary | ICD-10-CM | POA: Diagnosis not present

## 2023-03-27 DIAGNOSIS — I63312 Cerebral infarction due to thrombosis of left middle cerebral artery: Secondary | ICD-10-CM | POA: Diagnosis not present

## 2023-03-27 DIAGNOSIS — I6523 Occlusion and stenosis of bilateral carotid arteries: Secondary | ICD-10-CM | POA: Diagnosis not present

## 2023-03-27 DIAGNOSIS — Z7982 Long term (current) use of aspirin: Secondary | ICD-10-CM

## 2023-03-27 DIAGNOSIS — F32A Depression, unspecified: Secondary | ICD-10-CM | POA: Diagnosis present

## 2023-03-27 DIAGNOSIS — I672 Cerebral atherosclerosis: Secondary | ICD-10-CM | POA: Diagnosis not present

## 2023-03-27 DIAGNOSIS — R4701 Aphasia: Secondary | ICD-10-CM | POA: Diagnosis not present

## 2023-03-27 DIAGNOSIS — E785 Hyperlipidemia, unspecified: Secondary | ICD-10-CM | POA: Diagnosis present

## 2023-03-27 DIAGNOSIS — F419 Anxiety disorder, unspecified: Secondary | ICD-10-CM | POA: Diagnosis not present

## 2023-03-27 DIAGNOSIS — K5900 Constipation, unspecified: Secondary | ICD-10-CM | POA: Diagnosis not present

## 2023-03-27 DIAGNOSIS — I69314 Frontal lobe and executive function deficit following cerebral infarction: Secondary | ICD-10-CM | POA: Diagnosis not present

## 2023-03-27 DIAGNOSIS — I6782 Cerebral ischemia: Secondary | ICD-10-CM | POA: Diagnosis not present

## 2023-03-27 DIAGNOSIS — R29703 NIHSS score 3: Secondary | ICD-10-CM | POA: Diagnosis present

## 2023-03-27 DIAGNOSIS — I63522 Cerebral infarction due to unspecified occlusion or stenosis of left anterior cerebral artery: Secondary | ICD-10-CM | POA: Diagnosis not present

## 2023-03-27 LAB — ETHANOL: Alcohol, Ethyl (B): <10 mg/dL (ref <10)

## 2023-03-27 LAB — I-STAT CHEM 8, ED
BUN: 21 mg/dL (ref 8–23)
Calcium, Ion: 1.18 mmol/L (ref 1.15–1.40)
Chloride: 103 mmol/L (ref 98–111)
Creatinine, Ser: 0.9 mg/dL (ref 0.44–1.00)
Glucose, Bld: 104 mg/dL — ABNORMAL HIGH (ref 70–99)
HCT: 40 % (ref 36.0–46.0)
Hemoglobin: 13.6 g/dL (ref 12.0–15.0)
Potassium: 4.2 mmol/L (ref 3.5–5.1)
Sodium: 140 mmol/L (ref 135–145)
TCO2: 26 mmol/L (ref 22–32)

## 2023-03-27 LAB — DIFFERENTIAL
Abs Immature Granulocytes: 0.03 10*3/uL (ref 0.00–0.07)
Basophils Absolute: 0.1 10*3/uL (ref 0.0–0.1)
Basophils Relative: 1 %
Eosinophils Absolute: 0.1 10*3/uL (ref 0.0–0.5)
Eosinophils Relative: 2 %
Immature Granulocytes: 1 %
Lymphocytes Relative: 20 %
Lymphs Abs: 1 10*3/uL (ref 0.7–4.0)
Monocytes Absolute: 0.5 10*3/uL (ref 0.1–1.0)
Monocytes Relative: 9 %
Neutro Abs: 3.3 10*3/uL (ref 1.7–7.7)
Neutrophils Relative %: 67 %

## 2023-03-27 LAB — CBC
HCT: 39.9 % (ref 36.0–46.0)
Hemoglobin: 12.6 g/dL (ref 12.0–15.0)
MCH: 28.7 pg (ref 26.0–34.0)
MCHC: 31.6 g/dL (ref 30.0–36.0)
MCV: 90.9 fL (ref 80.0–100.0)
Platelets: 262 10*3/uL (ref 150–400)
RBC: 4.39 MIL/uL (ref 3.87–5.11)
RDW: 14.2 % (ref 11.5–15.5)
WBC: 5 10*3/uL (ref 4.0–10.5)
nRBC: 0 % (ref 0.0–0.2)

## 2023-03-27 LAB — COMPREHENSIVE METABOLIC PANEL
ALT: 17 U/L (ref 0–44)
AST: 21 U/L (ref 15–41)
Albumin: 3.8 g/dL (ref 3.5–5.0)
Alkaline Phosphatase: 56 U/L (ref 38–126)
Anion gap: 8 (ref 5–15)
BUN: 20 mg/dL (ref 8–23)
CO2: 27 mmol/L (ref 22–32)
Calcium: 9.6 mg/dL (ref 8.9–10.3)
Chloride: 103 mmol/L (ref 98–111)
Creatinine, Ser: 0.91 mg/dL (ref 0.44–1.00)
GFR, Estimated: >60 mL/min (ref >60)
Glucose, Bld: 109 mg/dL — ABNORMAL HIGH (ref 70–99)
Potassium: 4.2 mmol/L (ref 3.5–5.1)
Sodium: 138 mmol/L (ref 135–145)
Total Bilirubin: 0.6 mg/dL (ref 0.0–1.2)
Total Protein: 8 g/dL (ref 6.5–8.1)

## 2023-03-27 LAB — ECHOCARDIOGRAM COMPLETE
Area-P 1/2: 3.6 cm2
S' Lateral: 2.7 cm
Single Plane A4C EF: 44.9 %
Weight: 1929.47 [oz_av]

## 2023-03-27 LAB — RAPID URINE DRUG SCREEN, HOSP PERFORMED
Amphetamines: NOT DETECTED
Barbiturates: NOT DETECTED
Benzodiazepines: NOT DETECTED
Cocaine: NOT DETECTED
Opiates: NOT DETECTED
Tetrahydrocannabinol: POSITIVE — AB

## 2023-03-27 LAB — URINALYSIS, ROUTINE W REFLEX MICROSCOPIC
Bilirubin Urine: NEGATIVE
Glucose, UA: NEGATIVE mg/dL
Hgb urine dipstick: NEGATIVE
Ketones, ur: NEGATIVE mg/dL
Leukocytes,Ua: NEGATIVE
Nitrite: NEGATIVE
Protein, ur: NEGATIVE mg/dL
Specific Gravity, Urine: 1.031 — ABNORMAL HIGH (ref 1.005–1.030)
pH: 7 (ref 5.0–8.0)

## 2023-03-27 LAB — I-STAT CG4 LACTIC ACID, ED: Lactic Acid, Venous: 0.5 mmol/L (ref 0.5–1.9)

## 2023-03-27 LAB — APTT: aPTT: 36 s (ref 24–36)

## 2023-03-27 LAB — PROTIME-INR
INR: 1 (ref 0.8–1.2)
Prothrombin Time: 13.3 s (ref 11.4–15.2)

## 2023-03-27 LAB — CBG MONITORING, ED: Glucose-Capillary: 85 mg/dL (ref 70–99)

## 2023-03-27 LAB — MRSA NEXT GEN BY PCR, NASAL: MRSA by PCR Next Gen: NOT DETECTED

## 2023-03-27 LAB — HIV ANTIBODY (ROUTINE TESTING W REFLEX): HIV Screen 4th Generation wRfx: NONREACTIVE

## 2023-03-27 MED ORDER — STROKE: EARLY STAGES OF RECOVERY BOOK
Freq: Once | Status: AC
  Filled 2023-03-27: qty 1

## 2023-03-27 MED ORDER — ACETAMINOPHEN 325 MG PO TABS: 650.0000 mg | ORAL_TABLET | ORAL | Status: DC | PRN

## 2023-03-27 MED ORDER — IOHEXOL 350 MG/ML SOLN
75.0000 mL | Freq: Once | INTRAVENOUS | Status: AC | PRN
  Administered 2023-03-27: 75 mL via INTRAVENOUS

## 2023-03-27 MED ORDER — ACETAMINOPHEN 160 MG/5ML PO SOLN: 650.0000 mg | ORAL | Status: DC | PRN

## 2023-03-27 MED ORDER — SODIUM CHLORIDE 0.9 % IV SOLN: 100.0000 mL/h | INTRAVENOUS | Status: DC

## 2023-03-27 MED ORDER — FAMOTIDINE IN NACL 20-0.9 MG/50ML-% IV SOLN
20.0000 mg | Freq: Once | INTRAVENOUS | Status: DC
Start: 2023-03-27 — End: 2023-03-27

## 2023-03-27 MED ORDER — PANTOPRAZOLE SODIUM 40 MG IV SOLR
40.0000 mg | Freq: Every day | INTRAVENOUS | Status: DC
  Administered 2023-03-27: 40 mg via INTRAVENOUS
  Filled 2023-03-27: qty 10

## 2023-03-27 MED ORDER — ORAL CARE MOUTH RINSE: 15.0000 mL | OROMUCOSAL | Status: DC | PRN

## 2023-03-27 MED ORDER — TENECTEPLASE FOR STROKE
0.2500 mg/kg | PACK | Freq: Once | INTRAVENOUS | Status: AC
  Administered 2023-03-27: 14 mg via INTRAVENOUS
  Filled 2023-03-27: qty 10

## 2023-03-27 MED ORDER — SODIUM CHLORIDE 0.9% FLUSH
3.0000 mL | Freq: Once | INTRAVENOUS | Status: AC
  Administered 2023-03-29: 3 mL via INTRAVENOUS

## 2023-03-27 MED ORDER — ACETAMINOPHEN 650 MG RE SUPP: 650.0000 mg | RECTAL | Status: DC | PRN

## 2023-03-27 MED ORDER — SODIUM CHLORIDE 0.9 % IV BOLUS: 500.0000 mL | Freq: Once | INTRAVENOUS | Status: DC

## 2023-03-27 MED ORDER — SODIUM CHLORIDE 0.9 % IV SOLN: INTRAVENOUS | Status: DC

## 2023-03-27 MED ORDER — SENNOSIDES-DOCUSATE SODIUM 8.6-50 MG PO TABS: 1.0000 | ORAL_TABLET | Freq: Every evening | ORAL | Status: DC | PRN

## 2023-03-27 MED ORDER — DIPHENHYDRAMINE HCL 50 MG/ML IJ SOLN: 25.0000 mg | Freq: Once | INTRAMUSCULAR | Status: DC

## 2023-03-27 MED ORDER — CHLORHEXIDINE GLUCONATE CLOTH 2 % EX PADS
6.0000 | MEDICATED_PAD | Freq: Every day | CUTANEOUS | Status: DC
  Administered 2023-03-28 – 2023-04-01 (×5): 6 via TOPICAL

## 2023-03-27 NOTE — ED Notes (Signed)
 Spouse at bedside states he noticed a difference in her gait this morning around 0830 and difficulty explaining what was going on.   He states she has hx CVA

## 2023-03-27 NOTE — H&P (Signed)
 NEUROLOGY CONSULT NOTE   Date of service: March 27, 2023 Patient Name: Jacqueline Bonilla MRN:  [id number] DOB:  [birth year] Chief Complaint: "Code Stroke" Requesting Provider: Linwood Dibbles, MD  History of Present Illness  Jacqueline Bonilla is a 68 y.o. female with hx of of L MCA infarct December 2022, depression, hypertension, osteoporosis presenting with aphasia.  Husband noticed confusion and difficulty with balance starting at approximately 920 this morning.  He states that at 9 AM she was in her usual state of health.  He states the symptoms are quite similar to her stroke in December 2022 so he brought her to the hospital.  In 2022 she did undergo a mechanical thrombectomy after receiving TNK for a left M2/M3 occlusion, complicated by minor subarachnoid hemorrhage.  She did have a loop recorder placed at that time.  She is currently on aspirin 81 mg.  She was taken off of her Brilinta in February 2025.  She does currently follow with Dr. Pearlean Brownie at [facility] for stroke follow-up and cognitive impairment.  She recently had an EEG done that was negative for seizure activity and is currently being worked up for her cognitive decline.  While she has been having frequent falls, family reported no falls today   LKW: 0900 Modified rankin score: 2-Slight disability-UNABLE to perform all activities but does not need assistance IV Thrombolysis: Yes,  CTA obtained prior to decision making as I was confirming checklist and consenting family MRI obtained prior to decision making as given identical symptoms to prior stroke family agreed they would not want treatment if MRI was negative (with negative imaging favoring focal seizure from prior stroke) EVT: No, no LVO  NIHSS components Score: Comment  1a Level of Conscious 0[x]  1[]  2[]  3[]      1b LOC Questions 0[]  1[x]  2[]       1c LOC Commands 0[x]  1[]  2[]       2 Best Gaze 0[x]  1[]  2[]       3 Visual 0[x]  1[]  2[]  3[]      4 Facial Palsy  0[x]  1[]  2[]  3[]      5a Motor Arm - left 0[x]  1[]  2[]  3[]  4[]  UN[]    5b Motor Arm - Right 0[x]  1[]  2[]  3[]  4[]  UN[]    6a Motor Leg - Left 0[x]  1[]  2[]  3[]  4[]  UN[]    6b Motor Leg - Right 0[x]  1[]  2[]  3[]  4[]  UN[]    7 Limb Ataxia 0[x]  1[]  2[]  3[]  UN[]     8 Sensory 0[x]  1[]  2[]  UN[]      9 Best Language 0[]  1[]  2[x]  3[]      10 Dysarthria 0[x]  1[]  2[]  UN[]      11 Extinct. and Inattention 0[x]  1[]  2[]       TOTAL: 3      ROS  Comprehensive ROS performed and pertinent positives documented in HPI   Past History   Past Medical History:  Diagnosis Date   Anemia    Anxiety    Depression    Dysmenorrhea    Endometriosis    Fibroid    Hypertension    Implantable loop recorder present 2021   Microhematuria    negative workup   Osteoporosis    Tachycardia    Thrombocytopenia (HCC)     Past Surgical History:  Procedure Laterality Date   BREAST BIOPSY     BUBBLE STUDY  12/08/2020   Procedure: BUBBLE STUDY;  Surgeon: Wendall Stade, MD;  Location: [facility] ENDOSCOPY;  Service: Cardiovascular;;   CESAREAN SECTION  hysteroscopic resection     implantable loop recorder implant  10/21/2019   Medtronic Reveal Linq model LNQ 22 ([place] [id number]) implantable loop recorder   IR CT HEAD LTD  12/01/2020   IR PERCUTANEOUS ART THROMBECTOMY/INFUSION INTRACRANIAL INC DIAG ANGIO  12/01/2020   IR US GUIDE VASC ACCESS RIGHT  12/01/2020   ORIF HUMERUS FRACTURE Left 07/31/2021   Procedure: OPEN REDUCTION INTERNAL FIXATION (ORIF) DISTAL HUMERUS FRACTURE;  Surgeon: Roby Lofts, MD;  Location: [facility] OR;  Service: Orthopedics;  Laterality: Left;   RADIOLOGY WITH ANESTHESIA N/A 12/01/2020   Procedure: IR WITH ANESTHESIA - CODE STROKE;  Surgeon: Radiologist, Medication, MD;  Location: [facility] OR;  Service: Radiology;  Laterality: N/A;   TEE WITHOUT CARDIOVERSION N/A 12/08/2020   Procedure: TRANSESOPHAGEAL ECHOCARDIOGRAM (TEE);  Surgeon: Wendall Stade, MD;  Location: [facility] ENDOSCOPY;  Service: Cardiovascular;  Laterality:  N/A;    Family History: Family History  Problem Relation Age of Onset   Cancer Father        non hodgkin lymphoma & skin   Heart attack Maternal Grandfather    Dementia Mother    Polymyositis Sister     Social History  reports that she has never smoked. She has never used smokeless tobacco. She reports that she does not drink alcohol and does not use drugs.  Allergies  Allergen Reactions   Avelox [Moxifloxacin]    Ciprofloxacin Hives   Floraquin [Iodoquinol]     No cipro or others   Iohexol      Code: HIVES, Desc: HIVES S/P IVP MANY YRS AGO    Levaquin [Levofloxacin]    Plavix [Clopidogrel] Diarrhea   Quinolones    Shellfish Allergy Itching    Itching under the chin. Described by patient but not seen by husband.    Medications   Current Facility-Administered Medications:    sodium chloride 0.9 % bolus 500 mL, 500 mL, Intravenous, Once **FOLLOWED BY** 0.9 %  sodium chloride infusion, 100 mL/hr, Intravenous, Continuous, Linwood Dibbles, MD   sodium chloride flush (NS) 0.9 % injection 3 mL, 3 mL, Intravenous, Once, Linwood Dibbles, MD  Current Outpatient Medications:    Amino Acids Complex TABS, Take 1 tablet by mouth daily., Disp: , Rfl:    aspirin EC 81 MG tablet, Take 81 mg by mouth daily. Swallow whole., Disp: , Rfl:    CINNAMON PO, Take 1 capsule by mouth daily., Disp: , Rfl:    Coenzyme Q10 (CO Q 10 PO), Take 1 capsule by mouth daily., Disp: , Rfl:    losartan (COZAAR) 25 MG tablet, TAKE 1/2 TABLET BY MOUTH DAILY, Disp: 90 tablet, Rfl: 1   MAGNESIUM PO, Take 1 tablet by mouth at bedtime., Disp: , Rfl:    Multiple Vitamin (MULTIVITAMIN WITH MINERALS) TABS tablet, Take 1 tablet by mouth daily., Disp: , Rfl:    Multiple Vitamins-Minerals (ZINC PO), Take 1 tablet by mouth daily., Disp: , Rfl:   Vitals   Vitals:   03/27/23 0941 03/27/23 0947  BP:  (!) 148/96  Pulse:  100  Temp: 98.2 F (36.8 C)   TempSrc: Oral   SpO2:  95%    There is no height or weight on file to  calculate BMI.  Physical Exam   Constitutional: Appears well-developed and well-nourished.  Psych: Tearful Eyes: No scleral injection.  HENT: No OP obstruction.  Head: Normocephalic.  Cardiovascular: Normal rate and regular rhythm.  Respiratory: Effort normal, non-labored breathing.  GI: Soft.  No distension. There is no tenderness.  Skin: WDI.   Neurologic Examination   Neuro: Mental Status: Patient is awake, alert, oriented to person. Unable to state month or year.  Able to identify some objects, but does have a significant expressive aphasia.  Cranial Nerves: II: Visual Fields are full. Pupils are equal, round, and reactive to light.   III,IV, VI: EOMI without ptosis or diploplia.  V: Facial sensation is symmetric to temperature VII: Facial movement is symmetric resting and smiling VIII: Hearing is intact to voice X: Palate elevates symmetrically XI: Shoulder shrug is symmetric. XII: Tongue protrudes midline without atrophy or fasciculations.  Motor: Tone is normal. Bulk is normal. 5/5 strength was present in all four extremities.  Sensory: Sensation is symmetric to light touch and temperature in the arms and legs. No extinction to DSS present.  Cerebellar: FNF and HKS are intact bilaterally   Labs/Imaging/Neurodiagnostic studies   CBC:  Recent Labs  Lab April 24, 2023 0947 24-Apr-2023 0953  WBC 5.0  --   NEUTROABS 3.3  --   HGB 12.6 13.6  HCT 39.9 40.0  MCV 90.9  --   PLT 262  --    Basic Metabolic Panel:  Lab Results  Component Value Date   NA 138 02/11/2023   K 4.4 02/11/2023   CO2 28 02/11/2023   GLUCOSE 155 (H) 02/11/2023   BUN 13 02/11/2023   CREATININE 0.92 02/11/2023   CALCIUM 9.2 02/11/2023   GFRNONAA 59 (L) 10/19/2021   GFRAA 78 07/20/2019   Lipid Panel:  Lab Results  Component Value Date   LDLCALC 73 02/11/2023   HgbA1c:  Lab Results  Component Value Date   HGBA1C 5.7 (H) 02/19/2023   INR  Lab Results  Component Value Date   INR 1.1  07/31/2021   APTT  Lab Results  Component Value Date   APTT 36 12/01/2020   CT Head without contrast(Personally reviewed): 1. No evidence of acute intracranial abnormality. ASPECTS of 10. 2. Advanced cerebral atrophy and chronic ischemia.  CT angio Head and Neck with contrast(Personally reviewed): 1. No large vessel occlusion. 2. Severe distal left M2/proximal M3 stenosis. 3. Moderate mid and severe distal right P2 stenoses. 4. No significant stenosis in the neck. 5.  Aortic Atherosclerosis (ICD10-I70.0).  MRI Brain(Personally reviewed): 1. Small acute left MCA infarct. 2. Severe chronic small vessel ischemic disease and cerebral atrophy. 3. Small chronic bilateral cerebral and cerebellar infarcts.  Assessment   Jacqueline Bonilla is a 68 y.o. female presenting with expressive aphasia starting at approximately 0920 this morning.  CT head negative for any acute abnormality.  CTA head and neck shows no LVO.  MRI shows an evolving infarct in the left hemisphere and she was given TNK.  Plan to admit to ICU for continued stroke monitoring.  Stroke team to follow tomorrow.   Primary Diagnosis:  Cerebral infarction, unspecified.  Secondary Diagnosis: Hypertension, Hx of stroke  Recommendations  - HgbA1c, fasting lipid panel - Repeat MRI at 24 hours post TNK - Loop recorder interrogation  - Frequent neuro checks - Echocardiogram - Prophylactic therapy-Antiplatelet med once 24 hours post TNK - Risk factor modification - Telemetry monitoring - PT consult, OT consult, Speech consult - Stroke team primary - Blood pressure goal  - Post TNK for 24  hours < 180/105   ______________________________________________________________________   Leonia Reeves, NP [facility]  [facility] personally reviewed prior to TNK administration  Brooke Dare MD-PhD [facility] [phone] Available 7 AM to 7 PM, outside these hours please contact Neurologist on  call listed on AMION   CRITICAL CARE Performed by: Gordy Councilman   Total critical care time: 60 minutes  Critical care time was exclusive of separately billable procedures and treating other patients.  Critical care was necessary to treat or prevent imminent or life-threatening deterioration.  Critical care was time spent personally by me on the following activities: development of treatment plan with patient and/or surrogate as well as nursing, discussions with consultants, evaluation of patient's response to treatment, examination of patient, obtaining history from patient or surrogate, ordering and performing treatments and interventions, ordering and review of laboratory studies, ordering and review of radiographic studies, pulse oximetry and re-evaluation of patient's condition.

## 2023-03-27 NOTE — Progress Notes (Signed)
  Echocardiogram 2D Echocardiogram has been performed.  Jacqueline Bonilla 03/27/2023, 3:25 PM

## 2023-03-27 NOTE — Progress Notes (Signed)
 PHARMACIST CODE STROKE RESPONSE  Notified to mix TNK at 10:25 by Dr. Iver Nestle TNK preparation completed at 10:28  TNK dose = 14 mg IV over 5 seconds.   Issues/delays encountered (if applicable): n/a  Jacqueline Bonilla 03/27/23 10:48 AM

## 2023-03-27 NOTE — ED Provider Notes (Signed)
 [place] EMERGENCY DEPARTMENT AT [facility] Provider Note   CSN: [id number] Arrival date & time: 03/27/23  0932     History  Chief Complaint  Patient presents with   Code Stroke    Jacqueline Bonilla is a 68 y.o. female.  HPI   Patient has a history of endometriosis, thrombocytopenia, depression, anxiety, hypertension, prior strokes, subarachnoid hemorrhage and frequent falls.  Patient presents to the ED for evaluation of difficulty with her gait and trouble with her speech that started about 30 minutes prior to her arrival.  On arrival to the ED patient is having difficulty telling me what is bothering her.  She is able to answer yes and no questions but is having trouble with more complex answers.  She denies any headache.  She denies any pain.  Additional information was provided by her husband.  Patient has had prior strokes with has left her with some difficulty with comprehension and complex tasks as well as her gait.  However this morning he noticed she was acting normally.  She was walking without difficulty.  He states 30 minutes prior to arrival he noticed that she was having trouble communicating and speaking.  He also noted that her speech was off.  Home Medications Prior to Admission medications   Medication Sig Start Date End Date Taking? Authorizing Provider  Amino Acids Complex TABS Take 1 tablet by mouth daily.    [provider]  aspirin EC 81 MG tablet Take 81 mg by mouth daily. Swallow whole.    [provider]  CINNAMON PO Take 1 capsule by mouth daily.    [provider]  Coenzyme Q10 (CO Q 10 PO) Take 1 capsule by mouth daily.    [provider]  losartan (COZAAR) 25 MG tablet TAKE 1/2 TABLET BY MOUTH DAILY 10/28/22   Georgina Quint, MD  MAGNESIUM PO Take 1 tablet by mouth at bedtime.    [provider]  Multiple Vitamin (MULTIVITAMIN WITH MINERALS) TABS tablet Take 1 tablet by mouth daily.     [provider]  Multiple Vitamins-Minerals (ZINC PO) Take 1 tablet by mouth daily.    [provider]      Allergies    Avelox [moxifloxacin], Ciprofloxacin, Floraquin [iodoquinol], Iohexol, Levaquin [levofloxacin], Plavix [clopidogrel], Quinolones, and Shellfish allergy    Review of Systems   Review of Systems  Physical Exam Updated Vital Signs BP (!) 142/82   Pulse 69   Temp 98.2 F (36.8 C) (Oral)   Resp 13   Wt 54.7 kg   LMP 01/02/2003   SpO2 99%   BMI 20.70 kg/m  Physical Exam Vitals and nursing note reviewed.  Constitutional:      Appearance: She is well-developed. She is not diaphoretic.     Comments: , Anxious, tearful  HENT:     Head: Normocephalic and atraumatic.     Right Ear: External ear normal.     Left Ear: External ear normal.  Eyes:     General: No scleral icterus.       Right eye: No discharge.        Left eye: No discharge.     Conjunctiva/sclera: Conjunctivae normal.  Neck:     Trachea: No tracheal deviation.  Cardiovascular:     Rate and Rhythm: Normal rate and regular rhythm.  Pulmonary:     Effort: Pulmonary effort is normal. No respiratory distress.     Breath sounds: Normal breath sounds. No stridor. No  wheezing or rales.  Abdominal:     General: Bowel sounds are normal. There is no distension.     Palpations: Abdomen is soft.     Tenderness: There is no abdominal tenderness. There is no guarding or rebound.  Musculoskeletal:        General: No tenderness or deformity.     Cervical back: Neck supple.  Skin:    General: Skin is warm and dry.     Findings: No rash.  Neurological:     General: No focal deficit present.     Mental Status: She is alert.     Cranial Nerves: No cranial nerve deficit, dysarthria or facial asymmetry.     Sensory: No sensory deficit.     Motor: No abnormal muscle tone or seizure activity.     Coordination: Coordination abnormal.     Comments: Patient having difficulty with her speech, no  dysarthria, possible aphasia (word finding difficulty), patient unable to do finger-nose exam, unclear if patient is having difficulty with her coordination versus understanding the task, unable to touch her nose with either hand when asked to do so     ED Results / Procedures / Treatments   Labs (all labs ordered are listed, but only abnormal results are displayed) Labs Reviewed  COMPREHENSIVE METABOLIC PANEL - Abnormal; Notable for the following components:      Result Value   Glucose, Bld 109 (*)    All other components within normal limits  I-STAT CHEM 8, ED - Abnormal; Notable for the following components:   Glucose, Bld 104 (*)    All other components within normal limits  PROTIME-INR  APTT  CBC  DIFFERENTIAL  ETHANOL  RAPID URINE DRUG SCREEN, HOSP PERFORMED  URINALYSIS, ROUTINE W REFLEX MICROSCOPIC  HIV ANTIBODY (ROUTINE TESTING W REFLEX)  CBG MONITORING, ED  I-STAT CHEM 8, ED  I-STAT CG4 LACTIC ACID, ED    EKG EKG Interpretation Date/Time:  Wednesday March 27 2023 10:40:46 EDT Ventricular Rate:  70 PR Interval:  157 QRS Duration:  96 QT Interval:  470 QTC Calculation: 508 R Axis:   -34  Text Interpretation: Sinus rhythm Left axis deviation Abnormal R-wave progression, early transition Prolonged QT interval No significant change since last tracing Confirmed by Linwood Dibbles [phone]) on 03/27/2023 10:51:48 AM  Radiology CT HEAD CODE STROKE WO CONTRAST Result Date: 03/27/2023 CLINICAL DATA:  Code stroke. Neuro deficit, acute, stroke suspected. Aphasia. EXAM: CT HEAD WITHOUT CONTRAST TECHNIQUE: Contiguous axial images were obtained from the base of the skull through the vertex without intravenous contrast. RADIATION DOSE REDUCTION: This exam was performed according to the departmental dose-optimization program which includes automated exposure control, adjustment of the mA and/or kV according to patient size and/or use of iterative reconstruction technique. COMPARISON:  Head  CT and MRI 12/02/2020 FINDINGS: Brain: There is no evidence of an acute infarct, intracranial hemorrhage, mass, midline shift, or extra-axial fluid collection. Advanced cerebral atrophy is again noted. Patchy and confluent hypodensities in the cerebral white matter bilaterally are nonspecific but compatible with moderate to severe chronic small vessel ischemic disease. Small chronic cortical infarcts are again seen bilaterally, predominantly in the MCA territories. There are also chronic lacunar infarcts involving the basal ganglia and cerebellum bilaterally. Vascular: Calcified atherosclerosis at the skull base. No hyperdense vessel. Skull: No acute fracture or suspicious lesion. Sinuses/Orbits: Visualized paranasal sinuses and mastoid air cells are clear. Unremarkable orbits. Other: None. ASPECTS [facility] Stroke Program Early CT Score) - Ganglionic level infarction (caudate, lentiform nuclei,  internal capsule, insula, M1-M3 cortex): 7 - Supraganglionic infarction (M4-M6 cortex): 3 Total score (0-10 with 10 being normal): 10 These results were communicated to Dr. Iver Nestle at 10:09 am on 03/27/2023 by text page via the [facility] messaging system. IMPRESSION: 1. No evidence of acute intracranial abnormality. ASPECTS of 10. 2. Advanced cerebral atrophy and chronic ischemia. Electronically Signed   By: Sebastian Ache M.D.   On: 03/27/2023 10:41   CT ANGIO HEAD NECK W WO CM (CODE STROKE) Result Date: 03/27/2023 CLINICAL DATA:  Neuro deficit, acute, stroke suspected.  Aphasia. EXAM: CT ANGIOGRAPHY HEAD AND NECK WITH AND WITHOUT CONTRAST TECHNIQUE: Multidetector CT imaging of the head and neck was performed using the standard protocol during bolus administration of intravenous contrast. Multiplanar CT image reconstructions and MIPs were obtained to evaluate the vascular anatomy. Carotid stenosis measurements (when applicable) are obtained utilizing NASCET criteria, using the distal internal carotid diameter as the denominator.  RADIATION DOSE REDUCTION: This exam was performed according to the departmental dose-optimization program which includes automated exposure control, adjustment of the mA and/or kV according to patient size and/or use of iterative reconstruction technique. CONTRAST:  75mL OMNIPAQUE IOHEXOL 350 MG/ML SOLN COMPARISON:  MRA head 12/02/2020.  CTA head and neck 12/01/2020. FINDINGS: CTA NECK FINDINGS Aortic arch: Standard branching with mild atherosclerosis. No significant stenosis of the arch vessel origins. Right carotid system: Patent without evidence of stenosis or dissection. Left carotid system: Patent without evidence of stenosis or dissection. Vertebral arteries: Patent and codominant without evidence of stenosis or dissection. Skeleton: Bilateral facet ankylosis at C2-3. Asymmetric, severe left facet arthrosis at C3-4. Mild cervical disc degeneration. Other neck: No evidence of cervical lymphadenopathy or mass. Upper chest: No mass or consolidation in the included lung apices. Review of the MIP images confirms the above findings CTA HEAD FINDINGS Anterior circulation: The internal carotid arteries are patent from skull base to carotid termini with mild atherosclerosis bilaterally not resulting in a significant stenosis. ACAs and MCAs are patent without evidence of a proximal branch occlusion or significant proximal stenosis. There is a severe stenosis of a left distal M2/proximal M3 branch vessel (series 11, image 34). No aneurysm is identified. Posterior circulation: The intracranial vertebral arteries are widely patent to the basilar. Patent PICA and SCA origins are visualized bilaterally. The basilar artery is widely patent. There is likely a diminutive left posterior communicating artery. Both PCAs are patent without evidence of a significant proximal stenosis on the left. There is a new moderate mid right P2 stenosis, and there is also a new severe more distal right P2 stenosis at a branch point. No aneurysm  is identified. Venous sinuses: Poorly evaluated due to contrast timing. Anatomic variants: None. Review of the MIP images confirms the above findings The preliminary finding of no emergent large vessel occlusion was communicated to Dr. Iver Nestle at 10:21 am on 03/27/2023 by text page via the [facility] messaging system. IMPRESSION: 1. No large vessel occlusion. 2. Severe distal left M2/proximal M3 stenosis. 3. Moderate mid and severe distal right P2 stenoses. 4. No significant stenosis in the neck. 5.  Aortic Atherosclerosis (ICD10-I70.0). Electronically Signed   By: Sebastian Ache M.D.   On: 03/27/2023 10:38    Procedures .Critical Care  Performed by: Linwood Dibbles, MD Authorized by: Linwood Dibbles, MD   Critical care provider statement:    Critical care time (minutes):  30   Critical care was time spent personally by me on the following activities:  Development of treatment plan with patient  or surrogate, discussions with consultants, evaluation of patient's response to treatment, examination of patient, ordering and review of laboratory studies, ordering and review of radiographic studies, ordering and performing treatments and interventions, pulse oximetry, re-evaluation of patient's condition and review of old charts     Medications Ordered in ED Medications  sodium chloride flush (NS) 0.9 % injection 3 mL (has no administration in time range)  sodium chloride 0.9 % bolus 500 mL (has no administration in time range)    Followed by  0.9 %  sodium chloride infusion (has no administration in time range)  diphenhydrAMINE (BENADRYL) injection 25 mg (has no administration in time range)  famotidine (PEPCID) IVPB 20 mg premix (has no administration in time range)   stroke: early stages of recovery book (has no administration in time range)  0.9 %  sodium chloride infusion (has no administration in time range)  acetaminophen (TYLENOL) tablet 650 mg (has no administration in time range)    Or  acetaminophen  (TYLENOL) 160 MG/5ML solution 650 mg (has no administration in time range)    Or  acetaminophen (TYLENOL) suppository 650 mg (has no administration in time range)  senna-docusate (Senokot-S) tablet 1 tablet (has no administration in time range)  pantoprazole (PROTONIX) injection 40 mg (has no administration in time range)  iohexol (OMNIPAQUE) 350 MG/ML injection 75 mL (75 mLs Intravenous Contrast Given 03/27/23 1014)  tenecteplase (TNKASE) injection for Stroke 14 mg (14 mg Intravenous Given 03/27/23 1028)    ED Course/ Medical Decision Making/ A&P Clinical Course as of 03/27/23 1055  Wed Mar 27, 2023  1054 Comprehensive metabolic panel(!) Normal [JK]  1054 CBC Normal [JK]  1054 I-Stat CG4 Lactic Acid, ED Normal [JK]    Clinical Course User Index [JK] Linwood Dibbles, MD                                 Medical Decision Making Patient's presentation concerning for the possibility of acute stroke, TIA, cerebral hemorrhage  Problems Addressed: Difficulty with speech: acute illness or injury that poses a threat to life or bodily functions Weakness: acute illness or injury that poses a threat to life or bodily functions  Amount and/or Complexity of Data Reviewed Labs: ordered. Decision-making details documented in ED Course. Radiology: ordered and independent interpretation performed.  Risk Prescription drug management. Decision regarding hospitalization.   Patient presented to the ED on arrival for acute difficulty with speech and gait.  Patient's had prior strokes.  Husband noted the symptoms about 30 minutes prior to her arrival.  On my initial exam patient was having difficulty with finger-to-nose exam.  She also was having some issues with her speech.  Husband does indicate some deficits from her prior strokes but the symptoms are new today.  Presentation concerning for possible acute. stroke patient was activated as a code stroke.  Patient was seen by stroke team including Dr.  Iver Nestle.  Patient was an appropriate candidate for thrombolytics.  Patient was treated with TN K.  Patient will be admitted to the hospital for further treatment under the neurology service       Final Clinical Impression(s) / ED Diagnoses Final diagnoses:  Weakness  Difficulty with speech    Rx / DC Orders ED Discharge Orders     None         Linwood Dibbles, MD 03/27/23 1055

## 2023-03-27 NOTE — Code Documentation (Addendum)
 Stroke Response Nurse Documentation Code Documentation  Jacqueline Bonilla is a 68 y.o. female arriving to [facility]  via [employer] on 03/27/2023 with past medical hx of prior CVA. On aspirin 81 mg daily. Code stroke was activated by ED.   Patient from home where she was LKW at 0900 and now complaining of aphasia    Stroke team at the bedside on patient arrival. Labs drawn and patient cleared for CT by Dr. Lynelle Doctor. Patient to CT with team. NIHSS 4, see documentation for details and code stroke times. Patient with disoriented and Expressive aphasia  on exam. The following imaging was completed:  CT Head and CTA. Patient is a candidate for IV Thrombolytic due to fixed neurological deficit. TNK given 1028. Patient is not a candidate for IR due to LVO negative..   Care Plan:  NIHSS and VS q 15 x 8                   Then q 30 x 12                   Then q 1 hour for 16.   NPO until passes stroke swallow screen  BP < 180/105    Bedside handoff with ED RN Hunt[place] Dec complete.   Argentina Donovan   Stroke Response RN

## 2023-03-27 NOTE — ED Triage Notes (Signed)
 Pt states she had a stroke. She says yesterday is the LKW.  Pt is having a hard time recalling information and delayed responses.  Pt states her speech is off.

## 2023-03-28 ENCOUNTER — Inpatient Hospital Stay (HOSPITAL_COMMUNITY)

## 2023-03-28 DIAGNOSIS — I69391 Dysphagia following cerebral infarction: Secondary | ICD-10-CM

## 2023-03-28 DIAGNOSIS — F1210 Cannabis abuse, uncomplicated: Secondary | ICD-10-CM | POA: Diagnosis not present

## 2023-03-28 DIAGNOSIS — I63512 Cerebral infarction due to unspecified occlusion or stenosis of left middle cerebral artery: Principal | ICD-10-CM

## 2023-03-28 DIAGNOSIS — R29704 NIHSS score 4: Secondary | ICD-10-CM

## 2023-03-28 DIAGNOSIS — E785 Hyperlipidemia, unspecified: Secondary | ICD-10-CM

## 2023-03-28 DIAGNOSIS — I10 Essential (primary) hypertension: Secondary | ICD-10-CM | POA: Diagnosis not present

## 2023-03-28 DIAGNOSIS — I639 Cerebral infarction, unspecified: Secondary | ICD-10-CM | POA: Diagnosis not present

## 2023-03-28 LAB — LIPID PANEL
Cholesterol: 173 mg/dL (ref 0–200)
HDL: 42 mg/dL (ref >40)
LDL Cholesterol: 111 mg/dL — ABNORMAL HIGH (ref 0–99)
Total CHOL/HDL Ratio: 4.1 ratio
Triglycerides: 101 mg/dL (ref <150)
VLDL: 20 mg/dL (ref 0–40)

## 2023-03-28 MED ORDER — TICAGRELOR 90 MG PO TABS
90.0000 mg | ORAL_TABLET | Freq: Two times a day (BID) | ORAL | Status: DC
  Administered 2023-03-28 – 2023-04-01 (×9): 90 mg via ORAL
  Filled 2023-03-28 (×9): qty 1

## 2023-03-28 MED ORDER — ASPIRIN 81 MG PO CHEW
81.0000 mg | CHEWABLE_TABLET | Freq: Every day | ORAL | Status: DC
  Administered 2023-03-28 – 2023-04-01 (×5): 81 mg via ORAL
  Filled 2023-03-28 (×5): qty 1

## 2023-03-28 MED ORDER — HEPARIN SODIUM (PORCINE) 5000 UNIT/ML IJ SOLN
5000.0000 [IU] | Freq: Three times a day (TID) | INTRAMUSCULAR | Status: DC
  Administered 2023-03-28: 5000 [IU] via SUBCUTANEOUS
  Filled 2023-03-28: qty 1

## 2023-03-28 MED ORDER — ATORVASTATIN CALCIUM 40 MG PO TABS
40.0000 mg | ORAL_TABLET | Freq: Every day | ORAL | Status: DC
  Administered 2023-03-28 – 2023-04-01 (×5): 40 mg via ORAL
  Filled 2023-03-28 (×5): qty 1

## 2023-03-28 MED ORDER — STUDY - LIBREXIA-STROKE - MILVEXIAN 25 MG OR PLACEBO TABLET (PI-SETHI)
1.0000 | ORAL_TABLET | Freq: Two times a day (BID) | ORAL | Status: DC
Start: 2023-03-28 — End: 2023-04-01
  Administered 2023-03-29 – 2023-04-01 (×7): 1 via ORAL
  Filled 2023-03-28 (×9): qty 1

## 2023-03-28 NOTE — Evaluation (Addendum)
 Physical Therapy Evaluation Patient Details Name: Jacqueline Bonilla MRN: [id number] DOB: [birth year] Today's Date: 03/28/2023  History of Present Illness  Pt is a 68 y.o. F who presents 03/27/2023 with expressive aphasia. MRI showing evolving infarct in left hemisphere and she was given TNK. CTA head and neck shows no LVO. Significant PMH: L MCA infarct December 2022, depression, HTN, osteoporosis.  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 Pt presents with decreased functional mobility secondary to decreased R sided coordination, gait abnormalities, impaired standing balance, and cognition. Pt requiring min assist for room ambulation, with hand held assist for stability and guidance. Provided modA for lower body dressing; pt able to perform peri care with set up assist for left hand (pt typically right handed). Noted difficulty with motor planning and sequencing. Encouraged increased use of right hand for ADL's to promote neuro-recovery. Pt would benefit from intensive acute rehabilitation to address deficits and maximize functional independence.         If plan is discharge home, recommend the following: A little help with walking and/or transfers;A little help with bathing/dressing/bathroom;Assistance with cooking/housework;Assist for transportation;Help with stairs or ramp for entrance   Can travel by private vehicle        Equipment Recommendations Other (comment) (TBA)  Recommendations for Other Services  Rehab consult    Functional Status Assessment Patient has had a recent decline in their functional status and demonstrates the ability to make significant improvements in function in a reasonable and predictable amount of time.     Precautions / Restrictions Precautions Precautions: Fall Restrictions Weight Bearing Restrictions Per Provider Order: No      Mobility  Bed Mobility Overal bed mobility: Needs  Assistance Bed Mobility: Supine to Sit     Supine to sit: Supervision     General bed mobility comments: Able to transition to edge of bed without physical assist    Transfers Overall transfer level: Needs assistance Equipment used: None Transfers: Sit to/from Stand Sit to Stand: Contact guard assist           General transfer comment: Steadying assist to rise to stand from edge of bed and toilet    Ambulation/Gait Ambulation/Gait assistance: Min assist Gait Distance (Feet): 15 Feet Assistive device: 1 person hand held assist Gait Pattern/deviations: Step-through pattern, Decreased stride length, Decreased dorsiflexion - right, Decreased dorsiflexion - left, Festinating, Shuffle, Narrow base of support Gait velocity: decreased Gait velocity interpretation: <1.31 ft/sec, indicative of household ambulator   General Gait Details: Festinating type gait with decreased bilateral foot clearance and heel strike at initial contact. Consistent minA for balance and cues for environmental navigation. Decreased gait speed.  Stairs            Wheelchair Mobility     Tilt Bed    Modified Rankin (Stroke Patients Only) Modified Rankin (Stroke Patients Only) Pre-Morbid Rankin Score: Slight disability Modified Rankin: Moderately severe disability     Balance Overall balance assessment: Needs assistance Sitting-balance support: Feet supported Sitting balance-Leahy Scale: Good     Standing balance support: No upper extremity supported, During functional activity Standing balance-Leahy Scale: Poor Standing balance comment: External support by therapist                             Pertinent Vitals/Pain Pain Assessment Pain Assessment: Faces Faces Pain Scale: No hurt    Home Living Family/patient expects to  be discharged to:: Private residence Living Arrangements: Spouse/significant other Available Help at Discharge: Family Type of Home: House Home Access:  Stairs to enter Entrance Stairs-Rails: Right Entrance Stairs-Number of Steps: 5   Home Layout: One level Home Equipment:  (pt unable to provide) Additional Comments: Unsure of accuracy of home set up    Prior Function Prior Level of Function : Patient poor historian/Family not available             Mobility Comments: Pt reports she enjoys walking at baseline       Extremity/Trunk Assessment   Upper Extremity Assessment Upper Extremity Assessment: Defer to OT evaluation    Lower Extremity Assessment Lower Extremity Assessment: RLE deficits/detail;LLE deficits/detail RLE Deficits / Details: Strength 5/5 RLE Sensation: WNL RLE Coordination: decreased gross motor LLE Deficits / Details: Strength 5/5 LLE Sensation: WNL       Communication   Communication Communication: Other (comment) (some hesitancy of speech production)    Cognition Arousal: Alert Behavior During Therapy: WFL for tasks assessed/performed   PT - Cognitive impairments: History of cognitive impairments, No family/caregiver present to determine baseline, Awareness, Sequencing, Problem solving                       PT - Cognition Comments: Pt A&O to self, place, but not time, reporting year was 2024 and unsure of day of week. Pt able to recall "2025" when re-oriented. Pt not able to verbalize deficits. When asked if she was still having trouble with her speech, pt responding "yes." When performing peri care, pt using her L hand and stating, "why am I using my left hand?" Pt with difficulty with motor planning and sequencing Following commands: Impaired Following commands impaired: Follows multi-step commands inconsistently     Cueing       General Comments      Exercises     Assessment/Plan    PT Assessment Patient needs continued PT services  PT Problem List Decreased strength;Decreased activity tolerance;Decreased balance;Decreased mobility;Decreased coordination;Decreased  cognition;Decreased safety awareness       PT Treatment Interventions DME instruction;Gait training;Stair training;Functional mobility training;Therapeutic activities;Therapeutic exercise;Balance training;Patient/family education;Cognitive remediation;Neuromuscular re-education    PT Goals (Current goals can be found in the Care Plan section)  Acute Rehab PT Goals Patient Stated Goal: to improve PT Goal Formulation: With patient Time For Goal Achievement: 04/11/23 Potential to Achieve Goals: Good    Frequency Min 3X/week     Co-evaluation               AM-PAC PT "6 Clicks" Mobility  Outcome Measure Help needed turning from your back to your side while in a flat bed without using bedrails?: A Little Help needed moving from lying on your back to sitting on the side of a flat bed without using bedrails?: A Little Help needed moving to and from a bed to a chair (including a wheelchair)?: A Little Help needed standing up from a chair using your arms (e.g., wheelchair or bedside chair)?: A Little Help needed to walk in hospital room?: A Little Help needed climbing 3-5 steps with a railing? : A Lot 6 Click Score: 17    End of Session Equipment Utilized During Treatment: Gait belt Activity Tolerance: Patient tolerated treatment well Patient left: in chair;with call bell/phone within reach;with chair alarm set Nurse Communication: Mobility status PT Visit Diagnosis: Unsteadiness on feet (R26.81);Other abnormalities of gait and mobility (R26.89)    Time: 2130-8657 PT Time Calculation (min) (ACUTE  ONLY): 28 min   Charges:   PT Evaluation $PT Eval Moderate Complexity: 1 Mod PT Treatments $Therapeutic Activity: 8-22 mins PT General Charges $$ ACUTE PT VISIT: 1 Visit         Lillia Pauls, PT, DPT Acute Rehabilitation Services Office [phone]   Norval Morton 03/28/2023, 11:22 AM

## 2023-03-28 NOTE — Consult Note (Signed)
 Physical Medicine and Rehabilitation Consult Reason for Consult: Evaluate appropriateness for Inpatient Rehab Referring Physician: Dr. Iver Nestle    HPI: Jacqueline Bonilla is a 68 y.o. female with PMHx of  has a past medical history of Anemia, Anxiety, Depression, Dysmenorrhea, Endometriosis, Fibroid, Hypertension, Implantable loop recorder present (2021), Microhematuria, Osteoporosis, Tachycardia, and Thrombocytopenia (HCC). . They were admitted to [facility] on 03/27/2023 for increased confusion, balance deficits, and difficulty speaking.  Of note, she has a history of prior stroke left MCA status post thrombectomy in 2022, and has been taken off of Brilinta in February 2025.  CT head negative on intake, MRI showed evolving infarct in the left hemisphere and the patient was given TNK and admitted by neurology for acute CVA.  Per chart review, patient lives with her spouse in a house with a single level and 5 steps to enter.  She is independent of mobility and ADLs at baseline.  Currently, she is max assist for lower body dressing, supervision for bed mobility, contact-guard assist for transfers, and can walk min assist 15 feet with a single person hand-held assist.  SLP performed an evaluation and found difficulties with sustained attention, awareness, short-term memory, problem-solving and executive function as well as some mild residual aphasia; unsure how much is baseline given prior cognitive deficits, her husband feels she is about at her baseline with memory deficits.  Per review of neuropsych evaluation 05/06/2020, patient was fully dependent on husband for complex tasks such as driving and finances, most instrumental test such as cooking, chores, and grocery shopping; patient's husband confirms that this is still the case.  OT eval pending.  She has been to inpatient rehab for prior strokes July 2022 and December 2022, follows with Dr. Wynn Banker.  Since then, she has had progressive  frequency of falls at home, as well as decreased confidence in ambulation such that she is increasingly dependent on her husband.  This escalated significantly about 6 weeks ago, when they had to come home from a family trip early because she had 4 falls within a few days at the daughter's home.     Review of Systems  Constitutional:  Negative for chills and fever.  HENT:  Negative for hearing loss and nosebleeds.   Eyes:  Negative for blurred vision and double vision.  Respiratory:  Negative for cough and wheezing.   Cardiovascular:  Negative for chest pain and palpitations.  Gastrointestinal:  Negative for nausea and vomiting.  Musculoskeletal:  Positive for falls.  Neurological:  Positive for speech change and focal weakness. Negative for dizziness, sensory change and headaches.  Psychiatric/Behavioral:  Positive for memory loss. Negative for depression. The patient does not have insomnia.    Past Medical History:  Diagnosis Date   Anemia    Anxiety    Depression    Dysmenorrhea    Endometriosis    Fibroid    Hypertension    Implantable loop recorder present 2021   Microhematuria    negative workup   Osteoporosis    Tachycardia    Thrombocytopenia (HCC)    Past Surgical History:  Procedure Laterality Date   BREAST BIOPSY     BUBBLE STUDY  12/08/2020   Procedure: BUBBLE STUDY;  Surgeon: Wendall Stade, MD;  Location: [facility] ENDOSCOPY;  Service: Cardiovascular;;   CESAREAN SECTION     hysteroscopic resection     implantable loop recorder implant  10/21/2019   Medtronic Reveal Linq model LNQ 22 ([place] [id number]) implantable  loop recorder   IR CT HEAD LTD  12/01/2020   IR PERCUTANEOUS ART THROMBECTOMY/INFUSION INTRACRANIAL INC DIAG ANGIO  12/01/2020   IR US GUIDE VASC ACCESS RIGHT  12/01/2020   ORIF HUMERUS FRACTURE Left 07/31/2021   Procedure: OPEN REDUCTION INTERNAL FIXATION (ORIF) DISTAL HUMERUS FRACTURE;  Surgeon: Roby Lofts, MD;  Location: [facility] OR;  Service: Orthopedics;   Laterality: Left;   RADIOLOGY WITH ANESTHESIA N/A 12/01/2020   Procedure: IR WITH ANESTHESIA - CODE STROKE;  Surgeon: Radiologist, Medication, MD;  Location: [facility] OR;  Service: Radiology;  Laterality: N/A;   TEE WITHOUT CARDIOVERSION N/A 12/08/2020   Procedure: TRANSESOPHAGEAL ECHOCARDIOGRAM (TEE);  Surgeon: Wendall Stade, MD;  Location: [facility] ENDOSCOPY;  Service: Cardiovascular;  Laterality: N/A;   Family History  Problem Relation Age of Onset   Cancer Father        non hodgkin lymphoma & skin   Heart attack Maternal Grandfather    Dementia Mother    Polymyositis Sister    Social History:  reports that she has never smoked. She has never used smokeless tobacco. She reports that she does not drink alcohol and does not use drugs. Allergies:  Allergies  Allergen Reactions   Avelox [Moxifloxacin] Hives   Ciprofloxacin Hives   Floraquin [Iodoquinol]     No cipro or others   Iohexol      Code: HIVES, Desc: HIVES S/P IVP MANY YRS AGO    Levaquin [Levofloxacin] Hives   Plavix [Clopidogrel] Diarrhea   Quinolones Hives   Shellfish Allergy Itching    Itching under the chin. Described by patient but not seen by husband.   Medications Prior to Admission  Medication Sig Dispense Refill   Amino Acids Complex TABS Take 1 tablet by mouth daily.     aspirin EC 81 MG tablet Take 81 mg by mouth daily. Swallow whole.     CINNAMON PO Take 1 capsule by mouth daily.     Coenzyme Q10 (CO Q 10 PO) Take 1 capsule by mouth daily.     losartan (COZAAR) 25 MG tablet TAKE 1/2 TABLET BY MOUTH DAILY 90 tablet 1   MAGNESIUM PO Take 1 tablet by mouth at bedtime.     Multiple Vitamin (MULTIVITAMIN WITH MINERALS) TABS tablet Take 1 tablet by mouth daily.     Multiple Vitamins-Minerals (ZINC PO) Take 1 tablet by mouth daily.      Home: Home Living Family/patient expects to be discharged to:: Private residence Living Arrangements: Spouse/significant other Available Help at Discharge: Family Type of Home:  House Home Access: Stairs to enter Secretary/administrator of Steps: 5 Entrance Stairs-Rails: Right Home Layout: One level Home Equipment:  (pt unable to provide) Additional Comments: Unsure of accuracy of home set up  Lives With: Spouse  Functional History: Prior Function Prior Level of Function : Patient poor historian/Family not available Mobility Comments: Pt reports she enjoys walking at baseline Functional Status:  Mobility: Bed Mobility Overal bed mobility: Needs Assistance Bed Mobility: Supine to Sit Supine to sit: Supervision General bed mobility comments: Able to transition to edge of bed without physical assist Transfers Overall transfer level: Needs assistance Equipment used: None Transfers: Sit to/from Stand Sit to Stand: Contact guard assist General transfer comment: Steadying assist to rise to stand from edge of bed and toilet Ambulation/Gait Ambulation/Gait assistance: Min assist Gait Distance (Feet): 15 Feet Assistive device: 1 person hand held assist Gait Pattern/deviations: Step-through pattern, Decreased stride length, Decreased dorsiflexion - right, Decreased dorsiflexion - left, Festinating, Shuffle,  Narrow base of support General Gait Details: Festinating type gait with decreased bilateral foot clearance and heel strike at initial contact. Consistent minA for balance and cues for environmental navigation. Decreased gait speed. Gait velocity: decreased Gait velocity interpretation: <1.31 ft/sec, indicative of household ambulator    ADL:    Cognition: Cognition Overall Cognitive Status: No family/caregiver present to determine baseline cognitive functioning Arousal/Alertness: Awake/alert Orientation Level: Oriented to person, Oriented to place, Oriented to situation, Disoriented to time Attention: Sustained Sustained Attention: Impaired Sustained Attention Impairment: Verbal basic Memory: Impaired Memory Impairment: Decreased recall of new  information, Retrieval deficit Awareness: Impaired Awareness Impairment: Intellectual impairment Problem Solving: Impaired Problem Solving Impairment: Functional complex Executive Function: Sequencing, Occupational hygienist: Impaired Sequencing Impairment: Functional basic Organizing: Impaired Organizing Impairment: Functional basic Safety/Judgment: Appears intact Cognition Arousal: Alert Behavior During Therapy: WFL for tasks assessed/performed Overall Cognitive Status: No family/caregiver present to determine baseline cognitive functioning  Blood pressure 124/82, pulse 84, temperature 98.7 F (37.1 C), temperature source Oral, resp. rate 17, height 5\' 4"  (1.626 m), weight 54.7 kg, last menstrual period 01/02/2003, SpO2 97%. Physical Exam Constitutional:      Appearance: Normal appearance.  HENT:     Head: Normocephalic and atraumatic.     Right Ear: External ear normal.     Left Ear: External ear normal.     Mouth/Throat:     Mouth: Mucous membranes are dry.     Pharynx: Oropharynx is clear.  Eyes:     Extraocular Movements: Extraocular movements intact.     Pupils: Pupils are equal, round, and reactive to light.  Cardiovascular:     Rate and Rhythm: Normal rate and regular rhythm.     Heart sounds: Normal heart sounds.  Pulmonary:     Effort: Pulmonary effort is normal.     Breath sounds: Normal breath sounds.  Abdominal:     General: Abdomen is flat. Bowel sounds are normal.     Palpations: Abdomen is soft.  Musculoskeletal:        General: Normal range of motion.  Skin:    General: Skin is warm and dry.  Neurological:     Mental Status: She is alert.     Comments: Awake, alert, and oriented to self and place; time with cues. Moderate memory and attention deficits Moderate right-sided hemineglect Can follow all simple commands Sensation intact bilaterally Reflexes intact 5 out of 5 strength in left upper and lower extremity 4 out of 5 strength throughout right  upper extremity 5- out of 5 strength throughout left lower extremity Right upper extremity ataxia on finger-nose, right upper extremity fine motor deficits Cranial nerves significant for mild left facial droop  Psychiatric:        Mood and Affect: Mood normal.        Behavior: Behavior normal.     Comments: Fair insight into current deficits     Results for orders placed or performed during the hospital encounter of 03/27/23 (from the past 24 hours)  HIV Antibody (routine testing w rflx)     Status: None   Collection Time: 03/27/23  3:29 PM  Result Value Ref Range   HIV Screen 4th Generation wRfx Non Reactive Non Reactive  MRSA Next Gen by PCR, Nasal     Status: None   Collection Time: 03/27/23  9:20 PM   Specimen: Nasal Mucosa; Nasal Swab  Result Value Ref Range   MRSA by PCR Next Gen NOT DETECTED NOT DETECTED  Lipid panel  Status: Abnormal   Collection Time: 03/28/23  5:58 AM  Result Value Ref Range   Cholesterol 173 0 - 200 mg/dL   Triglycerides 409 <811 mg/dL   HDL 42 >91 mg/dL   Total CHOL/HDL Ratio 4.1 RATIO   VLDL 20 0 - 40 mg/dL   LDL Cholesterol 478 (H) 0 - 99 mg/dL   MR BRAIN WO CONTRAST Result Date: 03/28/2023 CLINICAL DATA:  Stroke follow-up. Neuro deficit with stroke suspected EXAM: MRI HEAD WITHOUT CONTRAST TECHNIQUE: Multiplanar, multiecho pulse sequences of the brain and surrounding structures were obtained without intravenous contrast. COMPARISON:  Brain MRI from yesterday FINDINGS: Brain: Coalescence of the area of restricted diffusion in the left posterior frontal lobe, now mainly isolated to the cortex and more intense. Preexistent extensive chronic infarction along the frontal parietal convexities with brain atrophy. Numerous small chronic infarcts in the bilateral cerebellum. 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 Small vessel ischemic gliosis seen in the cerebral white matter and  bilateral thalamus. There has been atrophy of the deep gray nuclei in addition to the cortex. No hydrocephalus, mass, or collection Vascular: Major flow voids are preserved Skull and upper cervical spine: Normal marrow signal Sinuses/Orbits: Negative IMPRESSION: Small acute infarct in the posterior left frontal cortex. Some of the diffusion hyperintensity on yesterday's scan was reversible. Advanced, widespread chronic ischemic injury with atrophy. Electronically Signed   By: Tiburcio Pea M.D.   On: 03/28/2023 11:57   ECHOCARDIOGRAM COMPLETE Result Date: 03/27/2023    ECHOCARDIOGRAM REPORT   Patient Name:   AKEILA LANA Wieczorek Date of Exam: 03/27/2023 Medical Rec #:  [id number]          Height:       64.0 in Accession #:    [id number]         Weight:       120.6 lb Date of Birth:  1955/11/26          BSA:          1.578 m Patient Age:    67 years           BP:           136/82 mmHg Patient Gender: F                  HR:           73 bpm. Exam Location:  Inpatient Procedure: 2D Echo (Both Spectral and Color Flow Doppler were utilized during            procedure). Indications:    stroke  History:        Patient has prior history of Echocardiogram examinations, most                 recent 12/08/2020. Risk Factors:Hypertension and Dyslipidemia.  Sonographer:    Delcie Roch RDCS Referring Phys: [id number] [facility]  Sonographer Comments: Suboptimal subcostal window. IMPRESSIONS  1. Left ventricular ejection fraction, by estimation, is 60 to 65%. The left ventricle has normal function. The left ventricle has no regional wall motion abnormalities. Left ventricular diastolic parameters are consistent with Grade I diastolic dysfunction (impaired relaxation).  2. Right ventricular systolic function is normal. The right ventricular size is normal.  3. The mitral valve is normal in structure. Trivial mitral valve regurgitation. No evidence of mitral stenosis.  4. The aortic valve is normal in structure. Aortic valve  regurgitation is not visualized. No aortic stenosis is  present.  5. The inferior vena cava is normal in size with greater than 50% respiratory variability, suggesting right atrial pressure of 3 mmHg. Conclusion(s)/Recommendation(s): No intracardiac source of embolism detected on this transthoracic study. Consider a transesophageal echocardiogram to exclude cardiac source of embolism if clinically indicated. FINDINGS  Left Ventricle: Left ventricular ejection fraction, by estimation, is 60 to 65%. The left ventricle has normal function. The left ventricle has no regional wall motion abnormalities. The left ventricular internal cavity size was normal in size. There is  no left ventricular hypertrophy. Left ventricular diastolic parameters are consistent with Grade I diastolic dysfunction (impaired relaxation). Right Ventricle: The right ventricular size is normal. No increase in right ventricular wall thickness. Right ventricular systolic function is normal. Left Atrium: Left atrial size was normal in size. Right Atrium: Right atrial size was normal in size. Pericardium: There is no evidence of pericardial effusion. Mitral Valve: The mitral valve is normal in structure. Trivial mitral valve regurgitation. No evidence of mitral valve stenosis. Tricuspid Valve: The tricuspid valve is normal in structure. Tricuspid valve regurgitation is not demonstrated. No evidence of tricuspid stenosis. Aortic Valve: The aortic valve is normal in structure. Aortic valve regurgitation is not visualized. No aortic stenosis is present. Pulmonic Valve: The pulmonic valve was normal in structure. Pulmonic valve regurgitation is not visualized. No evidence of pulmonic stenosis. Aorta: The aortic root is normal in size and structure. Venous: The inferior vena cava is normal in size with greater than 50% respiratory variability, suggesting right atrial pressure of 3 mmHg. IAS/Shunts: No atrial level shunt detected by color flow Doppler.  LEFT  VENTRICLE PLAX 2D LVIDd:         3.40 cm     Diastology LVIDs:         2.70 cm     LV e' medial:    6.42 cm/s LV PW:         0.70 cm     LV E/e' medial:  8.3 LV IVS:        0.80 cm     LV e' lateral:   11.90 cm/s LVOT diam:     1.90 cm     LV E/e' lateral: 4.5 LV SV:         43 LV SV Index:   27 LVOT Area:     2.84 cm  LV Volumes (MOD) LV vol d, MOD A4C: 57.4 ml LV vol s, MOD A4C: 31.6 ml LV SV MOD A4C:     57.4 ml RIGHT VENTRICLE RV Basal diam:  2.50 cm RV S prime:     13.10 cm/s TAPSE (M-mode): 1.8 cm LEFT ATRIUM             Index        RIGHT ATRIUM           Index LA diam:        3.10 cm 1.96 cm/m   RA Area:     11.90 cm LA Vol (A2C):   39.5 ml 25.03 ml/m  RA Volume:   28.10 ml  17.81 ml/m LA Vol (A4C):   21.8 ml 13.82 ml/m LA Biplane Vol: 29.6 ml 18.76 ml/m  AORTIC VALVE LVOT Vmax:   89.30 cm/s LVOT Vmean:  54.800 cm/s LVOT VTI:    0.153 m  AORTA Ao Root diam: 3.40 cm MITRAL VALVE MV Area (PHT): 3.60 cm    SHUNTS MV Decel Time: 211 msec    Systemic VTI:  0.15 m MV E  velocity: 53.40 cm/s  Systemic Diam: 1.90 cm MV A velocity: 81.90 cm/s MV E/A ratio:  0.65 Donato Schultz MD Electronically signed by Donato Schultz MD Signature Date/Time: 03/27/2023/4:13:02 PM    Final    MR BRAIN WO CONTRAST Result Date: 03/27/2023 CLINICAL DATA:  Stroke, follow up. EXAM: MRI HEAD WITHOUT CONTRAST TECHNIQUE: Multiplanar, multiecho pulse sequences of the brain and surrounding structures were obtained without intravenous contrast. COMPARISON:  Head CT 03/27/2023 and MRI 12/02/2020 FINDINGS: An abbreviated examination consisting of axial and coronal diffusion weighted imaging and axial FLAIR imaging was performed. There is a small acute left MCA infarct involving the posterosuperior aspect of the insula and parietal cortex and white matter. There is no significant corresponding cortical FLAIR hyperintensity within the infarct. There are small chronic bilateral frontal, parietal, and occipital cortical infarcts. Confluent T2  hyperintensities in the cerebral white matter bilaterally have progressed from the prior MRI and are nonspecific but compatible with severe chronic small vessel ischemic disease. There is advanced cerebral atrophy. Small chronic bilateral cerebellar infarcts are again noted. No intracranial mass effect or extra-axial fluid collection is identified. No intracranial hemorrhage is identified on this limited examination. IMPRESSION: 1. Small acute left MCA infarct. 2. Severe chronic small vessel ischemic disease and cerebral atrophy. 3. Small chronic bilateral cerebral and cerebellar infarcts. Electronically Signed   By: Sebastian Ache M.D.   On: 03/27/2023 12:10   CT HEAD CODE STROKE WO CONTRAST Result Date: 03/27/2023 CLINICAL DATA:  Code stroke. Neuro deficit, acute, stroke suspected. Aphasia. EXAM: CT HEAD WITHOUT CONTRAST TECHNIQUE: Contiguous axial images were obtained from the base of the skull through the vertex without intravenous contrast. RADIATION DOSE REDUCTION: This exam was performed according to the departmental dose-optimization program which includes automated exposure control, adjustment of the mA and/or kV according to patient size and/or use of iterative reconstruction technique. COMPARISON:  Head CT and MRI 12/02/2020 FINDINGS: Brain: There is no evidence of an acute infarct, intracranial hemorrhage, mass, midline shift, or extra-axial fluid collection. Advanced cerebral atrophy is again noted. Patchy and confluent hypodensities in the cerebral white matter bilaterally are nonspecific but compatible with moderate to severe chronic small vessel ischemic disease. Small chronic cortical infarcts are again seen bilaterally, predominantly in the MCA territories. There are also chronic lacunar infarcts involving the basal ganglia and cerebellum bilaterally. Vascular: Calcified atherosclerosis at the skull base. No hyperdense vessel. Skull: No acute fracture or suspicious lesion. Sinuses/Orbits:  Visualized paranasal sinuses and mastoid air cells are clear. Unremarkable orbits. Other: None. ASPECTS (Alberta Stroke Program Early CT Score) - Ganglionic level infarction (caudate, lentiform nuclei, internal capsule, insula, M1-M3 cortex): 7 - Supraganglionic infarction (M4-M6 cortex): 3 Total score (0-10 with 10 being normal): 10 These results were communicated to Dr. Iver Nestle at 10:09 am on 03/27/2023 by text page via the [facility] messaging system. IMPRESSION: 1. No evidence of acute intracranial abnormality. ASPECTS of 10. 2. Advanced cerebral atrophy and chronic ischemia. Electronically Signed   By: Sebastian Ache M.D.   On: 03/27/2023 10:41   CT ANGIO HEAD NECK W WO CM (CODE STROKE) Result Date: 03/27/2023 CLINICAL DATA:  Neuro deficit, acute, stroke suspected.  Aphasia. EXAM: CT ANGIOGRAPHY HEAD AND NECK WITH AND WITHOUT CONTRAST TECHNIQUE: Multidetector CT imaging of the head and neck was performed using the standard protocol during bolus administration of intravenous contrast. Multiplanar CT image reconstructions and MIPs were obtained to evaluate the vascular anatomy. Carotid stenosis measurements (when applicable) are obtained utilizing NASCET criteria, using the distal internal  carotid diameter as the denominator. RADIATION DOSE REDUCTION: This exam was performed according to the departmental dose-optimization program which includes automated exposure control, adjustment of the mA and/or kV according to patient size and/or use of iterative reconstruction technique. CONTRAST:  75mL OMNIPAQUE IOHEXOL 350 MG/ML SOLN COMPARISON:  MRA head 12/02/2020.  CTA head and neck 12/01/2020. FINDINGS: CTA NECK FINDINGS Aortic arch: Standard branching with mild atherosclerosis. No significant stenosis of the arch vessel origins. Right carotid system: Patent without evidence of stenosis or dissection. Left carotid system: Patent without evidence of stenosis or dissection. Vertebral arteries: Patent and codominant without  evidence of stenosis or dissection. Skeleton: Bilateral facet ankylosis at C2-3. Asymmetric, severe left facet arthrosis at C3-4. Mild cervical disc degeneration. Other neck: No evidence of cervical lymphadenopathy or mass. Upper chest: No mass or consolidation in the included lung apices. Review of the MIP images confirms the above findings CTA HEAD FINDINGS Anterior circulation: The internal carotid arteries are patent from skull base to carotid termini with mild atherosclerosis bilaterally not resulting in a significant stenosis. ACAs and MCAs are patent without evidence of a proximal branch occlusion or significant proximal stenosis. There is a severe stenosis of a left distal M2/proximal M3 branch vessel (series 11, image 34). No aneurysm is identified. Posterior circulation: The intracranial vertebral arteries are widely patent to the basilar. Patent PICA and SCA origins are visualized bilaterally. The basilar artery is widely patent. There is likely a diminutive left posterior communicating artery. Both PCAs are patent without evidence of a significant proximal stenosis on the left. There is a new moderate mid right P2 stenosis, and there is also a new severe more distal right P2 stenosis at a branch point. No aneurysm is identified. Venous sinuses: Poorly evaluated due to contrast timing. Anatomic variants: None. Review of the MIP images confirms the above findings The preliminary finding of no emergent large vessel occlusion was communicated to Dr. Iver Nestle at 10:21 am on 03/27/2023 by text page via the [facility] messaging system. IMPRESSION: 1. No large vessel occlusion. 2. Severe distal left M2/proximal M3 stenosis. 3. Moderate mid and severe distal right P2 stenoses. 4. No significant stenosis in the neck. 5.  Aortic Atherosclerosis (ICD10-I70.0). Electronically Signed   By: Sebastian Ache M.D.   On: 03/27/2023 10:38    Assessment/Plan: Diagnosis: Left MCA CVA, cryptogenic, status post TNK Does the need for  close, 24 hr/day medical supervision in concert with the patient's rehab needs make it unreasonable for this patient to be served in a less intensive setting? Yes Co-Morbidities requiring supervision/potential complications: Cognitive deficits, tachycardia, right wrist pain/prior fracture, confabulation/hallucinations, and generalized debility with multiple recent falls Due to safety, skin/wound care, disease management, medication administration, pain management, and patient education, does the patient require 24 hr/day rehab nursing? Yes Does the patient require coordinated care of a physician, rehab nurse, therapy disciplines of PT, OT, and SLP to address physical and functional deficits in the context of the above medical diagnosis(es)? Yes Addressing deficits in the following areas: balance, endurance, locomotion, strength, transferring, bathing, dressing, feeding, grooming, toileting, cognition, and speech Can the patient actively participate in an intensive therapy program of at least 3 hrs of therapy per day at least 5 days per week? Yes The potential for patient to make measurable gains while on inpatient rehab is excellent Anticipated functional outcomes upon discharge from inpatient rehab are supervision  with PT, supervision with OT, supervision with SLP. Estimated rehab length of stay to reach the above functional goals  is: 10-14 days Anticipated discharge destination: Home Overall Rehab/Functional Prognosis: good  POST ACUTE RECOMMENDATIONS: This patient's condition is appropriate for continued rehabilitative care in the following setting: CIR Patient has agreed to participate in recommended program. Yes Note that insurance prior authorization may be required for reimbursement for recommended care.  Comment: Ms. Shary Decamp is a 68 year old female here status post left-sided MCA shortly after being taken off of Brilinta for prophylaxis.  She has had multiple inpatient rehab stays  for prior strokes, with good outcomes and some ongoing memory deficits.  Prior to her most recent stroke, she was showing some signs of increasing debility from multiple falls and decreased ambulatory confidence, resulting in increasing dependence on her husband.  She would benefit from a inpatient rehab stay to work both on stroke recovery and reconditioning to increase ambulatory tolerance and use risk of further falls.  She lives in a single-story home with her husband, who can provide 24/7 for support.   MEDICAL RECOMMENDATIONS: Recommend neuropsychiatric consultation once in inpatient rehab to assess worsening memory deficits and confabulations per husband Consider removal of DVT chemoprophylaxis while resuming DAPT, given significant risk for ongoing falls; could switch to SCDs   I have personally performed a face to face diagnostic evaluation of this patient. Additionally, I have examined the patient's medical record including any pertinent labs and radiographic images. If the physician assistant has documented in this note, I have reviewed and edited or otherwise concur with the physician assistant's documentation.  Thanks,  Angelina Sheriff, DO 03/28/2023

## 2023-03-28 NOTE — Progress Notes (Addendum)
 Yes triple STROKE TEAM PROGRESS NOTE   INTERIM HISTORY/SUBJECTIVE She presented yesterday with difficulty walking and confusion.  She was given TNK MRI brain showed small acute left MCA infarct and similar area as her previous stroke  No family at the bedside. 24-hour brain imaging with no hemorrhage.  Will start aspirin 81 mg and Brilinta 90 mg twice daily.  Blood pressure adequately controlled.  UDS positive for THC Dr.Xavian Hardcastle spoke to her about [place] stroke trial study and she wants to discuss it with her husband and let us know her decision to enroll in the study  On exam she is awake and alert oriented to self and month stated age as 34 right partial hemianopia, some mild facial droop and mild right upper extremity ataxia  Transfer patient out of the ICU  CBC    Component Value Date/Time   WBC 5.0 03/27/2023 0947   RBC 4.39 03/27/2023 0947   HGB 13.6 03/27/2023 0953   HGB 13.3 06/11/2019 1431   HGB 12.4 08/04/2013 1620   HCT 40.0 03/27/2023 0953   HCT 40.2 06/11/2019 1431   PLT 262 03/27/2023 0947   PLT 160 06/11/2019 1431   MCV 90.9 03/27/2023 0947   MCV 93.7 07/20/2019 0950   MCV 91 06/11/2019 1431   MCH 28.7 03/27/2023 0947   MCHC 31.6 03/27/2023 0947   RDW 14.2 03/27/2023 0947   RDW 12.0 06/11/2019 1431   LYMPHSABS 1.0 03/27/2023 0947   LYMPHSABS 1.3 06/11/2019 1431   MONOABS 0.5 03/27/2023 0947   EOSABS 0.1 03/27/2023 0947   EOSABS 0.1 06/11/2019 1431   BASOSABS 0.1 03/27/2023 0947   BASOSABS 0.1 06/11/2019 1431    BMET    Component Value Date/Time   NA 140 03/27/2023 0953   NA 137 07/20/2019 1058   K 4.2 03/27/2023 0953   CL 103 03/27/2023 0953   CO2 27 03/27/2023 0947   GLUCOSE 104 (H) 03/27/2023 0953   BUN 21 03/27/2023 0953   BUN 20 07/20/2019 1058   CREATININE 0.90 03/27/2023 0953   CREATININE 0.91 07/11/2015 1517   CALCIUM 9.6 03/27/2023 0947   GFRNONAA >60 03/27/2023 0947   GFRNONAA 68 07/11/2015 1517    IMAGING past 24  hours ECHOCARDIOGRAM COMPLETE Result Date: 03/27/2023    ECHOCARDIOGRAM REPORT   Patient Name:   Jacqueline Bonilla Date of Exam: 03/27/2023 Medical Rec #:  [id number]          Height:       64.0 in Accession #:    [id number]         Weight:       120.6 lb Date of Birth:  [birth year]          BSA:          1.578 m Patient Age:    67 years           BP:           136/82 mmHg Patient Gender: F                  HR:           73 bpm. Exam Location:  Inpatient Procedure: 2D Echo (Both Spectral and Color Flow Doppler were utilized during            procedure). Indications:    stroke  History:        Patient has prior history of Echocardiogram examinations, most  recent 12/08/2020. Risk Factors:Hypertension and Dyslipidemia.  Sonographer:    Delcie Roch RDCS Referring Phys: [id number] [facility]  Sonographer Comments: Suboptimal subcostal window. IMPRESSIONS  1. Left ventricular ejection fraction, by estimation, is 60 to 65%. The left ventricle has normal function. The left ventricle has no regional wall motion abnormalities. Left ventricular diastolic parameters are consistent with Grade I diastolic dysfunction (impaired relaxation).  2. Right ventricular systolic function is normal. The right ventricular size is normal.  3. The mitral valve is normal in structure. Trivial mitral valve regurgitation. No evidence of mitral stenosis.  4. The aortic valve is normal in structure. Aortic valve regurgitation is not visualized. No aortic stenosis is present.  5. The inferior vena cava is normal in size with greater than 50% respiratory variability, suggesting right atrial pressure of 3 mmHg. Conclusion(s)/Recommendation(s): No intracardiac source of embolism detected on this transthoracic study. Consider a transesophageal echocardiogram to exclude cardiac source of embolism if clinically indicated. FINDINGS  Left Ventricle: Left ventricular ejection fraction, by estimation, is 60 to 65%. The left ventricle has  normal function. The left ventricle has no regional wall motion abnormalities. The left ventricular internal cavity size was normal in size. There is  no left ventricular hypertrophy. Left ventricular diastolic parameters are consistent with Grade I diastolic dysfunction (impaired relaxation). Right Ventricle: The right ventricular size is normal. No increase in right ventricular wall thickness. Right ventricular systolic function is normal. Left Atrium: Left atrial size was normal in size. Right Atrium: Right atrial size was normal in size. Pericardium: There is no evidence of pericardial effusion. Mitral Valve: The mitral valve is normal in structure. Trivial mitral valve regurgitation. No evidence of mitral valve stenosis. Tricuspid Valve: The tricuspid valve is normal in structure. Tricuspid valve regurgitation is not demonstrated. No evidence of tricuspid stenosis. Aortic Valve: The aortic valve is normal in structure. Aortic valve regurgitation is not visualized. No aortic stenosis is present. Pulmonic Valve: The pulmonic valve was normal in structure. Pulmonic valve regurgitation is not visualized. No evidence of pulmonic stenosis. Aorta: The aortic root is normal in size and structure. Venous: The inferior vena cava is normal in size with greater than 50% respiratory variability, suggesting right atrial pressure of 3 mmHg. IAS/Shunts: No atrial level shunt detected by color flow Doppler.  LEFT VENTRICLE PLAX 2D LVIDd:         3.40 cm     Diastology LVIDs:         2.70 cm     LV e' medial:    6.42 cm/s LV PW:         0.70 cm     LV E/e' medial:  8.3 LV IVS:        0.80 cm     LV e' lateral:   11.90 cm/s LVOT diam:     1.90 cm     LV E/e' lateral: 4.5 LV SV:         43 LV SV Index:   27 LVOT Area:     2.84 cm  LV Volumes (MOD) LV vol d, MOD A4C: 57.4 ml LV vol s, MOD A4C: 31.6 ml LV SV MOD A4C:     57.4 ml RIGHT VENTRICLE RV Basal diam:  2.50 cm RV S prime:     13.10 cm/s TAPSE (M-mode): 1.8 cm LEFT ATRIUM              Index        RIGHT ATRIUM  Index LA diam:        3.10 cm 1.96 cm/m   RA Area:     11.90 cm LA Vol (A2C):   39.5 ml 25.03 ml/m  RA Volume:   28.10 ml  17.81 ml/m LA Vol (A4C):   21.8 ml 13.82 ml/m LA Biplane Vol: 29.6 ml 18.76 ml/m  AORTIC VALVE LVOT Vmax:   89.30 cm/s LVOT Vmean:  54.800 cm/s LVOT VTI:    0.153 m  AORTA Ao Root diam: 3.40 cm MITRAL VALVE MV Area (PHT): 3.60 cm    SHUNTS MV Decel Time: 211 msec    Systemic VTI:  0.15 m MV E velocity: 53.40 cm/s  Systemic Diam: 1.90 cm MV A velocity: 81.90 cm/s MV E/A ratio:  0.65 Donato Schultz MD Electronically signed by Donato Schultz MD Signature Date/Time: 03/27/2023/4:13:02 PM    Final    MR BRAIN WO CONTRAST Result Date: 03/27/2023 CLINICAL DATA:  Stroke, follow up. EXAM: MRI HEAD WITHOUT CONTRAST TECHNIQUE: Multiplanar, multiecho pulse sequences of the brain and surrounding structures were obtained without intravenous contrast. COMPARISON:  Head CT 03/27/2023 and MRI 12/02/2020 FINDINGS: An abbreviated examination consisting of axial and coronal diffusion weighted imaging and axial FLAIR imaging was performed. There is a small acute left MCA infarct involving the posterosuperior aspect of the insula and parietal cortex and white matter. There is no significant corresponding cortical FLAIR hyperintensity within the infarct. There are small chronic bilateral frontal, parietal, and occipital cortical infarcts. Confluent T2 hyperintensities in the cerebral white matter bilaterally have progressed from the prior MRI and are nonspecific but compatible with severe chronic small vessel ischemic disease. There is advanced cerebral atrophy. Small chronic bilateral cerebellar infarcts are again noted. No intracranial mass effect or extra-axial fluid collection is identified. No intracranial hemorrhage is identified on this limited examination. IMPRESSION: 1. Small acute left MCA infarct. 2. Severe chronic small vessel ischemic disease and cerebral  atrophy. 3. Small chronic bilateral cerebral and cerebellar infarcts. Electronically Signed   By: Sebastian Ache M.D.   On: 03/27/2023 12:10   CT HEAD CODE STROKE WO CONTRAST Result Date: 03/27/2023 CLINICAL DATA:  Code stroke. Neuro deficit, acute, stroke suspected. Aphasia. EXAM: CT HEAD WITHOUT CONTRAST TECHNIQUE: Contiguous axial images were obtained from the base of the skull through the vertex without intravenous contrast. RADIATION DOSE REDUCTION: This exam was performed according to the departmental dose-optimization program which includes automated exposure control, adjustment of the mA and/or kV according to patient size and/or use of iterative reconstruction technique. COMPARISON:  Head CT and MRI 12/02/2020 FINDINGS: Brain: There is no evidence of an acute infarct, intracranial hemorrhage, mass, midline shift, or extra-axial fluid collection. Advanced cerebral atrophy is again noted. Patchy and confluent hypodensities in the cerebral white matter bilaterally are nonspecific but compatible with moderate to severe chronic small vessel ischemic disease. Small chronic cortical infarcts are again seen bilaterally, predominantly in the MCA territories. There are also chronic lacunar infarcts involving the basal ganglia and cerebellum bilaterally. Vascular: Calcified atherosclerosis at the skull base. No hyperdense vessel. Skull: No acute fracture or suspicious lesion. Sinuses/Orbits: Visualized paranasal sinuses and mastoid air cells are clear. Unremarkable orbits. Other: None. ASPECTS ([place] Stroke Program Early CT Score) - Ganglionic level infarction (caudate, lentiform nuclei, internal capsule, insula, M1-M3 cortex): 7 - Supraganglionic infarction (M4-M6 cortex): 3 Total score (0-10 with 10 being normal): 10 These results were communicated to Dr. Iver Nestle at 10:09 am on 03/27/2023 by text page via the [facility] messaging system. IMPRESSION: 1.  No evidence of acute intracranial abnormality. ASPECTS of 10. 2.  Advanced cerebral atrophy and chronic ischemia. Electronically Signed   By: Sebastian Ache M.D.   On: 03/27/2023 10:41   CT ANGIO HEAD NECK W WO CM (CODE STROKE) Result Date: 03/27/2023 CLINICAL DATA:  Neuro deficit, acute, stroke suspected.  Aphasia. EXAM: CT ANGIOGRAPHY HEAD AND NECK WITH AND WITHOUT CONTRAST TECHNIQUE: Multidetector CT imaging of the head and neck was performed using the standard protocol during bolus administration of intravenous contrast. Multiplanar CT image reconstructions and MIPs were obtained to evaluate the vascular anatomy. Carotid stenosis measurements (when applicable) are obtained utilizing NASCET criteria, using the distal internal carotid diameter as the denominator. RADIATION DOSE REDUCTION: This exam was performed according to the departmental dose-optimization program which includes automated exposure control, adjustment of the mA and/or kV according to patient size and/or use of iterative reconstruction technique. CONTRAST:  75mL OMNIPAQUE IOHEXOL 350 MG/ML SOLN COMPARISON:  MRA head 12/02/2020.  CTA head and neck 12/01/2020. FINDINGS: CTA NECK FINDINGS Aortic arch: Standard branching with mild atherosclerosis. No significant stenosis of the arch vessel origins. Right carotid system: Patent without evidence of stenosis or dissection. Left carotid system: Patent without evidence of stenosis or dissection. Vertebral arteries: Patent and codominant without evidence of stenosis or dissection. Skeleton: Bilateral facet ankylosis at C2-3. Asymmetric, severe left facet arthrosis at C3-4. Mild cervical disc degeneration. Other neck: No evidence of cervical lymphadenopathy or mass. Upper chest: No mass or consolidation in the included lung apices. Review of the MIP images confirms the above findings CTA HEAD FINDINGS Anterior circulation: The internal carotid arteries are patent from skull base to carotid termini with mild atherosclerosis bilaterally not resulting in a significant  stenosis. ACAs and MCAs are patent without evidence of a proximal branch occlusion or significant proximal stenosis. There is a severe stenosis of a left distal M2/proximal M3 branch vessel (series 11, image 34). No aneurysm is identified. Posterior circulation: The intracranial vertebral arteries are widely patent to the basilar. Patent PICA and SCA origins are visualized bilaterally. The basilar artery is widely patent. There is likely a diminutive left posterior communicating artery. Both PCAs are patent without evidence of a significant proximal stenosis on the left. There is a new moderate mid right P2 stenosis, and there is also a new severe more distal right P2 stenosis at a branch point. No aneurysm is identified. Venous sinuses: Poorly evaluated due to contrast timing. Anatomic variants: None. Review of the MIP images confirms the above findings The preliminary finding of no emergent large vessel occlusion was communicated to Dr. Iver Nestle at 10:21 am on 03/27/2023 by text page via the [facility] messaging system. IMPRESSION: 1. No large vessel occlusion. 2. Severe distal left M2/proximal M3 stenosis. 3. Moderate mid and severe distal right P2 stenoses. 4. No significant stenosis in the neck. 5.  Aortic Atherosclerosis (ICD10-I70.0). Electronically Signed   By: Sebastian Ache M.D.   On: 03/27/2023 10:38    Vitals:   03/28/23 0600 03/28/23 0628 03/28/23 0700 03/28/23 0800  BP: 130/82 130/82 123/76   Pulse: 83  90   Resp: 12  13   Temp:    98.7 F (37.1 C)  TempSrc:    Oral  SpO2: 95%  97%   Weight:      Height:         PHYSICAL EXAM General:  Alert, well-nourished, well-developed pleasant middle-age Caucasian lady no acute distress Psych:  Mood and affect appropriate for situation CV: Regular rate and  rhythm on monitor Respiratory:  Regular, unlabored respirations on room air GI: Abdomen soft and nontender   NEURO:  Mental Status: AA&O oriented to self and month stated age of  55 Speech/Language: speech is mild aphasia naming, repetition, fluency, and comprehension intact.  Cranial Nerves:  II: PERRL. Visual fields partial right homonymous hemianopsia III, IV, VI: EOMI. Eyelids elevate symmetrically.  V: Sensation is intact to light touch and symmetrical to face.  VII: Face is symmetrical resting and smiling VIII: hearing intact to voice. IX, X: Palate elevates symmetrically. Phonation is normal.  ZO:[id number] shrug 5/5. XII: tongue is midline without fasciculations. Motor: 4/5 strength in right upper extremity with weakness of grip and intrinsic hand muscles.  Tone is increased on the right compared to the left.  Left upper extremity is 5 out of 5.  Both lower extremities are 5 out of 5..    Sensation- Intact to light touch bilaterally. Extinction absent to light touch to DSS.   Coordination: Mild right upper extremity finger-to-nose ataxia Gait- deferred  Most Recent NIH 4 Premorbid baseline modified Rankin score 2  ASSESSMENT/PLAN  Jacqueline Bonilla is a 68 y.o. female with history of hypertension, anxiety and depression, prior left MCA infarct in 2022 admitted for aphasia, difficulty walking and confusion.  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  Atrophy. ASPECTS 10.    CTA head & neck no LVO evere distal left M2/proximal M3 stenosis. Moderate mid and severe distal right P2 stenose MRI  small acute left MCA infarct 2D Echo EF 60 to 65%.  LV with grade 1 diastolic dysfunction LDL 111 HgbA1c 5.7 VTE prophylaxis -Lovenox aspirin 81 mg daily prior to admission, now on aspirin 81 mg daily and Brilinta (ticagrelor) 90 mg bid for 4 weeks and then aspirin alone. (Patient has Plavix allergy) Therapy recommendations:  Pending Disposition: Pending  Hx of Stroke/TIA December 2022-was on aspirin and Brilinta up until February of this  year  Hypertension Home meds: Losartan 25 mg Stable Blood Pressure Goal: SBP less than 160   Hyperlipidemia Home meds: CoQ 10, not resumed in hospital LDL 111, goal < 70 Add lovastatin 40 mg Continue statin at discharge  Substance Abuse Patient uses THC UDS positive for  THC        Ready to quit? Yes TOC consult for cessation placed  Dysphagia Patient has post-stroke dysphagia, SLP consulted    Diet   Diet Heart Room service appropriate? Yes with Assist; Fluid consistency: Thin   Advance diet as tolerated  Other Stroke Risk Factors ETOH use, alcohol level <10, advised to drink no more than 1 drink(s) a day   Other Active Problems   Hospital day # 1   Gevena Mart DNP, ACNPC-AG  [facility]  I have personally obtained history,examined this patient, reviewed notes, independently viewed imaging studies, participated in medical decision making and plan of care.ROS completed by me personally and pertinent positives fully documented  I have made any additions or clarifications directly to the above note. Agree with note above.  Patient presented with sudden onset of speech difficulties and increased difficulty with gait and was treated with IV TNK with good recovery.  MRI shows patchy left MCA branch infarct and pretty much similar region to her previous infarct in 2022 when she had undergone mechanical thrombectomy.  Continue close neurological observation and strict blood pressure control as per  post TNK protocol.  Mobilize out of bed.  Therapy consults.  Interrogate loop recorder for paroxysmal A-fib.  Recommend aspirin and Brilinta for 4 weeks followed by aspirin alone as patient is allergic to Plavix. Patient may also consider possible participation in the [place] stroke trial and was given written information to review and decide.  She and her husband were clearly informed that study participation is voluntary and patient will get the same excellent medical care  irrespective of whether she participates in the study or not. This patient is critically ill and at significant risk of neurological worsening, death and care requires constant monitoring of vital signs, hemodynamics,respiratory and cardiac monitoring, extensive review of multiple databases, frequent neurological assessment, discussion with family, other specialists and medical decision making of high complexity.I have made any additions or clarifications directly to the above note.This critical care time does not reflect procedure time, or teaching time or supervisory time of PA/NP/Med Resident etc but could involve care discussion time.  I spent 30 minutes of neurocritical care time  in the care of  this patient.     Delia Heady, MD Medical Director [facility] Pager: [phone] 03/28/2023 4:24 PM    To contact Stroke Continuity provider, please refer to [email or web address]. After hours, contact General Neurology

## 2023-03-28 NOTE — PMR Pre-admission (Signed)
 PMR Admission Coordinator Pre-Admission Assessment  Patient: Jacqueline Bonilla is an 68 y.o., female MRN: [id number] DOB: 1955/01/06 Height: 5\' 4"  (162.6 cm) Weight: 51.9 kg             Insurance Information HMO: yes    PPO:      PCP:      IPA:      80/20:      OTHER:  PRIMARY: [employer] HMO      Policy#: [id number]; Medicare [id number]      Subscriber: pt CM Name: Michaelle Copas      Phone#: [phone] ext [phone]    Fax#: [phone] Pre-Cert#: [id number] initial denial on 3/28 and appealed 3/28  approved for 5 days 03/29/23 until 04/04/23. F/u with Pola Corn phone [phone] x [phone] fax [phone]        Benefits:  Phone #: [phone]     Name: 3/27 Eff. Date: 01/02/23     Deduct: none      Out of Pocket Max: [phone]      Life Max: none  CIR: $399 co pay per day days 1 until 7 ( $2793)      SNF: $10 co pay per day days 1 until 20; $214 co pay per day days 21 until 100 Outpatient: $25 per visit     Co-Pay:  Home Health: 100%      Co-Pay:  DME: 80%     Co-Pay: 20% Providers: in network  SECONDARY: none  Financial Counselor:       Phone#:   The Data processing manager" for patients in Inpatient Rehabilitation Facilities with attached "Privacy Act Statement-Health Care Records" was provided and verbally reviewed with: Patient and Family  Emergency Contact Information Contact Information     Name Relation Home Work Mobile   Lemanski,[name] Spouse   [phone]      Other Contacts   None on File    Current Medical History  Patient Admitting Diagnosis: CVA  History of Present Illness: Jacqueline Bonilla is a 68 y.o. female with PMHx of anemia, Anxiety, Depression, Dysmenorrhea, Endometriosis, Fibroid, Hypertension, Implantable loop recorder present (2021), Microhematuria, Osteoporosis, Tachycardia, and Thrombocytopenia . She was admitted to [facility] on 03/27/2023 for increased confusion, balance deficits, and difficulty speaking.  Of note,  she has a history of prior stroke left MCA status post thrombectomy in 2022, and has been taken off of Brilinta in February 2025. Admission chemistries unremarkable, urine drug screen positive marijuana. Echocardiogram ejection fraction of 60 to 65% no wall motion abnormalities grade 1 diastolic dysfunction. Therapy evaluations completed and due to patient's decreased functional mobility was admitted for a comprehensive rehab program.   CT head negative on intake, MRI showed evolving infarct in the left hemisphere and the patient was given TNK and admitted by neurology for acute CVA.   Per chart review, patient lives with her spouse in a house with a single level and 5 steps to enter.  She is independent of mobility and ADLs at baseline.  Currently, she is mod assist for lower body dressing, supervision for bed mobility, contact-guard assist for transfers, and can walk min assist 300 feet with a single person hand-held assist.  SLP performed an evaluation and found difficulties with sustained attention, awareness, short-term memory, problem-solving and executive function as well as some mild residual aphasia; unsure how much is baseline given prior cognitive deficits, her husband feels she is about at her baseline with memory deficits.  Per review of neuro psych evaluation  05/06/2020, patient was fully dependent on husband for complex tasks such as driving and finances, most instrumental test such as cooking, chores, and grocery shopping; patient's husband confirms that this is still the case.  OT eval pending.  She has been to inpatient rehab for prior strokes July 2022 and December 2022, follows with Dr. Wynn Banker.  Since then, she has had progressive frequency of falls at home, as well as decreased confidence in ambulation such that she is increasingly dependent on her husband.  This escalated significantly about 6 weeks ago, when they had to come home from a family trip early because she had 4 falls within a  few days at the daughter's home.   Complete NIHSS TOTAL: 4 Glasgow Coma Scale Score: 15  Patient's medical record from [facility] has been reviewed by the rehabilitation admission coordinator and physician.  Past Medical History  Past Medical History:  Diagnosis Date   Anemia    Anxiety    Depression    Dysmenorrhea    Endometriosis    Fibroid    Hypertension    Implantable loop recorder present 2021   Microhematuria    negative workup   Osteoporosis    Tachycardia    Thrombocytopenia (HCC)    Has the patient had major surgery during 100 days prior to admission? No  Family History  family history includes Cancer in her father; Dementia in her mother; Heart attack in her maternal grandfather; Polymyositis in her sister.  Current Medications   Current Facility-Administered Medications:    acetaminophen (TYLENOL) tablet 650 mg, 650 mg, Oral, Q4H PRN **OR** acetaminophen (TYLENOL) 160 MG/5ML solution 650 mg, 650 mg, Per Tube, Q4H PRN **OR** acetaminophen (TYLENOL) suppository 650 mg, 650 mg, Rectal, Q4H PRN, Elmer Picker, NP   aspirin chewable tablet 81 mg, 81 mg, Oral, Daily, Pearlean Brownie, Pramod S, MD, 81 mg at 03/29/23 1610   atorvastatin (LIPITOR) tablet 40 mg, 40 mg, Oral, Daily, Micki Riley, MD, 40 mg at 03/29/23 9604   Chlorhexidine Gluconate Cloth 2 % PADS 6 each, 6 each, Topical, Q0600, Micki Riley, MD, 6 each at 03/29/23 0708   Oral care mouth rinse, 15 mL, Mouth Rinse, PRN, Micki Riley, MD   senna-docusate (Senokot-S) tablet 1 tablet, 1 tablet, Oral, QHS PRN, Pamalee Leyden, Devon, NP   sodium chloride 0.9 % bolus 500 mL, 500 mL, Intravenous, Once **FOLLOWED BY** [DISCONTINUED] 0.9 %  sodium chloride infusion, 100 mL/hr, Intravenous, Continuous, Linwood Dibbles, MD   sodium chloride flush (NS) 0.9 % injection 3 mL, 3 mL, Intravenous, Once, Linwood Dibbles, MD   Study - LIBREXIA-STROKE - milvexian 25 mg or placebo tablet (PI-Sethi), 1 tablet, Oral, BID, Micki Riley, MD,  1 tablet at 03/29/23 5409   ticagrelor (BRILINTA) tablet 90 mg, 90 mg, Oral, BID, Micki Riley, MD, 90 mg at 03/29/23 8119  Patients Current Diet:  Diet Order             Diet Heart Room service appropriate? Yes with Assist; Fluid consistency: Thin  Diet effective now                  Precautions / Restrictions Precautions Precautions: Fall Restrictions Weight Bearing Restrictions Per Provider Order: No   Has the patient had 2 or more falls or a fall with injury in the past year?Yes  Prior Activity Level Limited Community (1-2x/wk): supervision with all adls per spouse  Prior Functional Level Prior Function Prior Level of Function : Patient poor  historian/Family not available Mobility Comments: Pt reports she enjoys walking at baseline ADLs Comments: spouse provides supervision for all adls per spouse in the past 4 years  Self Care: Did the patient need help bathing, dressing, using the toilet or eating?  Needed some help  Indoor Mobility: Did the patient need assistance with walking from room to room (with or without device)? Independent  Stairs: Did the patient need assistance with internal or external stairs (with or without device)? Independent  Functional Cognition: Did the patient need help planning regular tasks such as shopping or remembering to take medications? Needed some help  Patient Information Are you of Hispanic, Latino/a,or Spanish origin?: A. No, not of Hispanic, Latino/a, or Spanish origin What is your race?: A. White Do you need or want an interpreter to communicate with a doctor or health care staff?: 0. No  Patient's Response To:  Health Literacy and Transportation Is the patient able to respond to health literacy and transportation needs?: Yes Health Literacy - How often do you need to have someone help you when you read instructions, pamphlets, or other written material from your doctor or pharmacy?: Sometimes In the past 12 months, has  lack of transportation kept you from medical appointments or from getting medications?: No In the past 12 months, has lack of transportation kept you from meetings, work, or from getting things needed for daily living?: No  Journalist, newspaper / Equipment Home Equipment:  (pt unable to provide)  Prior Device Use: Indicate devices/aids used by the patient prior to current illness, exacerbation or injury? None of the above  Current Functional Level Cognition  Arousal/Alertness: Awake/alert Overall Cognitive Status: History of cognitive impairments - at baseline Orientation Level: Oriented X4 Attention: Sustained Sustained Attention: Impaired Sustained Attention Impairment: Verbal basic Memory: Impaired Memory Impairment: Decreased recall of new information, Retrieval deficit Awareness: Impaired Awareness Impairment: Intellectual impairment Problem Solving: Impaired Problem Solving Impairment: Functional complex Executive Function: Sequencing, Organizing Sequencing: Impaired Sequencing Impairment: Functional basic Organizing: Impaired Organizing Impairment: Functional basic Safety/Judgment: Appears intact    Extremity Assessment (includes Sensation/Coordination)  Upper Extremity Assessment: Right hand dominant, RUE deficits/detail RUE Deficits / Details: Grossly 4/5 MMT RUE Sensation: WNL RUE Coordination: decreased fine motor, decreased gross motor  Lower Extremity Assessment: Defer to PT evaluation RLE Deficits / Details: Strength 5/5 RLE Sensation: WNL RLE Coordination: decreased gross motor LLE Deficits / Details: Strength 5/5 LLE Sensation: WNL    ADLs  Overall ADL's : Needs assistance/impaired Eating/Feeding: Minimal assistance, Sitting Grooming: Minimal assistance, Standing Upper Body Bathing: Minimal assistance, Sitting Lower Body Bathing: Moderate assistance, Sit to/from stand Upper Body Dressing : Moderate assistance, Sitting Lower Body Dressing: Moderate  assistance, Sit to/from stand Toilet Transfer: Minimal assistance, Regular Toilet, Ambulation    Mobility  Overal bed mobility: Needs Assistance Bed Mobility: Supine to Sit Supine to sit: Supervision General bed mobility comments: up in recliner on arrival and departure.    Transfers  Overall transfer level: Needs assistance Equipment used: None Transfers: Sit to/from Stand Sit to Stand: Contact guard assist Bed to/from chair/wheelchair/BSC transfer type:: Step pivot Step pivot transfers: Min assist General transfer comment: Steadying assist to rise to stand from recliner    Ambulation / Gait / Stairs / Wheelchair Mobility  Ambulation/Gait Ambulation/Gait assistance: Min assist, Contact guard assist Gait Distance (Feet): 300 Feet Assistive device: 1 person hand held assist Gait Pattern/deviations: Step-through pattern, Decreased stride length, Decreased dorsiflexion - right, Decreased dorsiflexion - left, Shuffle, Narrow base of support General Gait  Details: Decreased bilateral foot clearance and heel strike at initial contact. Consistent minA for balance and cues for environmental navigation. Decreased gait speed. Gait velocity: decreased Gait velocity interpretation: 1.31 - 2.62 ft/sec, indicative of limited community ambulator Stairs: Yes Stairs assistance: Contact guard assist Stair Management: One rail Right, Forwards, Backwards Number of Stairs: 1 General stair comments: HHA    Posture / Balance Balance Overall balance assessment: Needs assistance Sitting-balance support: Feet supported Sitting balance-Leahy Scale: Good Standing balance support: Single extremity supported, No upper extremity supported Standing balance-Leahy Scale: Poor Standing balance comment: External support by therapist Standardized Balance Assessment Standardized Balance Assessment : Dynamic Gait Index Dynamic Gait Index Level Surface: Mild Impairment Change in Gait Speed: Moderate  Impairment Gait with Horizontal Head Turns: Moderate Impairment Gait with Vertical Head Turns: Severe Impairment Gait and Pivot Turn: Severe Impairment Step Over Obstacle: Mild Impairment Step Around Obstacles: Mild Impairment Steps: Moderate Impairment Total Score: 9    Special needs/care consideration Fall precautions Memory deficits at baseline   Previous Home Environment  Living Arrangements: Spouse/significant other  Lives With: Spouse Available Help at Discharge: Family, Available 24 hours/day (spouse providing 24/7 supervision for the past 4 years) Type of Home: House Home Layout: One level Home Access: Stairs to enter Entrance Stairs-Rails: Right Entrance Stairs-Number of Steps: 5 Bathroom Shower/Tub: Health visitor: Handicapped height Bathroom Accessibility: Yes How Accessible: Accessible via walker Home Care Services: No Additional Comments: Unsure of accuracy of home set up  Discharge Living Setting Plans for Discharge Living Setting: Patient's home, House Type of Home at Discharge: House Discharge Home Layout: One level Discharge Home Access: Stairs to enter Entrance Stairs-Rails: Right Entrance Stairs-Number of Steps: 5 Discharge Bathroom Shower/Tub: Walk-in shower Discharge Bathroom Toilet: Handicapped height Discharge Bathroom Accessibility: Yes How Accessible: Accessible via walker Does the patient have any problems obtaining your medications?: No  Social/Family/Support Systems Patient Roles: Spouse Contact Information: spouse Anticipated Caregiver: spouse Anticipated Caregiver's Contact Information: see contacts Ability/Limitations of Caregiver: caregiver for her for 4 years Caregiver Availability: 24/7 Discharge Plan Discussed with Primary Caregiver: Yes Is Caregiver In Agreement with Plan?: Yes Does Caregiver/Family have Issues with Lodging/Transportation while Pt is in Rehab?: No  Goals Patient/Family Goal for Rehab: supervision  with PT, OT and SLP Expected length of stay: ELOS 7 to 10 days Additional Information: CIR twice in 2022 Pt/Family Agrees to Admission and willing to participate: Yes Program Orientation Provided & Reviewed with Pt/Caregiver Including Roles  & Responsibilities: Yes  Decrease burden of Care through IP rehab admission: n/a  Possible need for SNF placement upon discharge:not anticipated  Patient Condition: This patient's condition remains as documented in the consult dated 03/28/23, in which the Rehabilitation Physician determined and documented that the patient's condition is appropriate for intensive rehabilitative care in an inpatient rehabilitation facility. Will admit to inpatient rehab today.  Preadmission Screen Completed By:  Clois Dupes, RN MSN 03/29/2023 4:45 PM, updates provided by Lissa Merlin, PT 04/01/23 ______________________________________________________________________   Discussed status with Dr. Riley Kill on 04/01/23 at 9:00 am and received approval for admission today.  Admission Coordinator:  Clois Dupes, RN MSN, updates provided by Lissa Merlin, PT time 9:28 am/Date 04/01/23

## 2023-03-28 NOTE — Discharge Summary (Addendum)
 Stroke Discharge Summary  Patient ID: Jacqueline Bonilla   MRN: [id number]      DOB: 1955/05/07  Date of Admission: 03/27/2023 Date of Discharge: 04/01/2023  Attending Physician:  Stroke, Md, MD Consultant(s):    rehabilitation medicine  Patient's PCP:  Georgina Quint, MD  DISCHARGE PRIMARY DIAGNOSIS:  Stroke - left MCA infarcts s/p TNK, etiology: Likely from left MCA stenosis, large vessel disease    Secondary diagnosis History of stroke History of seizure Hypertension Hyperlipidemia Substance use-marijuana Vascular dementia   Allergies as of 04/01/2023       Reactions   Avelox [moxifloxacin] Hives   Ciprofloxacin Hives   Floraquin [iodoquinol]    No cipro or others   Iohexol     Code: HIVES, Desc: HIVES S/P IVP MANY YRS AGO   Levaquin [levofloxacin] Hives   Plavix [clopidogrel] Diarrhea   Quinolones Hives   Shellfish Allergy Itching   Itching under the chin. Described by patient but not seen by husband.        Medication List     STOP taking these medications    aspirin EC 81 MG tablet Replaced by: aspirin 81 MG chewable tablet       TAKE these medications    Amino Acids Complex Tabs Take 1 tablet by mouth daily.   aspirin 81 MG chewable tablet Chew 1 tablet (81 mg total) by mouth daily. Start taking on: April 02, 2023 Replaces: aspirin EC 81 MG tablet   atorvastatin 40 MG tablet Commonly known as: LIPITOR Take 1 tablet (40 mg total) by mouth daily. Start taking on: April 02, 2023   CINNAMON PO Take 1 capsule by mouth daily.   CO Q 10 PO Take 1 capsule by mouth daily.   LIBREXIA-STROKE milvexian or placebo 25 mg tablet Take 1 tablet by mouth 2 (two) times daily.   losartan 25 MG tablet Commonly known as: COZAAR TAKE 1/2 TABLET BY MOUTH DAILY   MAGNESIUM PO Take 1 tablet by mouth at bedtime.   multivitamin with minerals Tabs tablet Take 1 tablet by mouth daily.   ticagrelor 90 MG Tabs tablet Commonly known as:  BRILINTA Take 1 tablet (90 mg total) by mouth 2 (two) times daily.   ZINC PO Take 1 tablet by mouth daily.        LABORATORY STUDIES CBC    Component Value Date/Time   WBC 5.0 03/27/2023 0947   RBC 4.39 03/27/2023 0947   HGB 13.6 03/27/2023 0953   HGB 13.3 06/11/2019 1431   HGB 12.4 08/04/2013 1620   HCT 40.0 03/27/2023 0953   HCT 40.2 06/11/2019 1431   PLT 262 03/27/2023 0947   PLT 160 06/11/2019 1431   MCV 90.9 03/27/2023 0947   MCV 93.7 07/20/2019 0950   MCV 91 06/11/2019 1431   MCH 28.7 03/27/2023 0947   MCHC 31.6 03/27/2023 0947   RDW 14.2 03/27/2023 0947   RDW 12.0 06/11/2019 1431   LYMPHSABS 1.0 03/27/2023 0947   LYMPHSABS 1.3 06/11/2019 1431   MONOABS 0.5 03/27/2023 0947   EOSABS 0.1 03/27/2023 0947   EOSABS 0.1 06/11/2019 1431   BASOSABS 0.1 03/27/2023 0947   BASOSABS 0.1 06/11/2019 1431   CMP    Component Value Date/Time   NA 140 03/27/2023 0953   NA 137 07/20/2019 1058   K 4.2 03/27/2023 0953   CL 103 03/27/2023 0953   CO2 27 03/27/2023 0947   GLUCOSE 104 (H) 03/27/2023 0953   BUN 21 03/27/2023  0953   BUN 20 07/20/2019 1058   CREATININE 0.90 03/27/2023 0953   CREATININE 0.91 07/11/2015 1517   CALCIUM 9.6 03/27/2023 0947   PROT 8.0 03/27/2023 0947   PROT 6.6 07/20/2019 1058   ALBUMIN 3.8 03/27/2023 0947   ALBUMIN 4.2 07/20/2019 1058   AST 21 03/27/2023 0947   ALT 17 03/27/2023 0947   ALKPHOS 56 03/27/2023 0947   BILITOT 0.6 03/27/2023 0947   BILITOT 0.4 07/20/2019 1058   GFRNONAA >60 03/27/2023 0947   GFRNONAA 68 07/11/2015 1517   GFRAA 78 07/20/2019 1058   GFRAA 78 07/11/2015 1517   COAGS Lab Results  Component Value Date   INR 1.0 03/27/2023   INR 1.1 07/31/2021   INR 1.0 12/01/2020   Lipid Panel    Component Value Date/Time   CHOL 173 03/28/2023 0558   CHOL 193 09/08/2020 0813   TRIG 101 03/28/2023 0558   HDL 42 03/28/2023 0558   HDL 52 09/08/2020 0813   CHOLHDL 4.1 03/28/2023 0558   VLDL 20 03/28/2023 0558   LDLCALC 111  (H) 03/28/2023 0558   LDLCALC 127 (H) 09/08/2020 0813   HgbA1C  Lab Results  Component Value Date   HGBA1C 5.7 (H) 02/19/2023   Alcohol Level    Component Value Date/Time   ETH <10 03/27/2023 0947     SIGNIFICANT DIAGNOSTIC STUDIES MR BRAIN WO CONTRAST Result Date: 03/28/2023 CLINICAL DATA:  Stroke follow-up. Neuro deficit with stroke suspected EXAM: MRI HEAD WITHOUT CONTRAST TECHNIQUE: Multiplanar, multiecho pulse sequences of the brain and surrounding structures were obtained without intravenous contrast. COMPARISON:  Brain MRI from yesterday FINDINGS: Brain: Coalescence of the area of restricted diffusion in the left posterior frontal lobe, now mainly isolated to the cortex and more intense. Preexistent extensive chronic infarction along the frontal parietal convexities with brain atrophy. Numerous small chronic infarcts in the bilateral cerebellum. 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 Small vessel ischemic gliosis seen in the cerebral white matter and bilateral thalamus. There has been atrophy of the deep gray nuclei in addition to the cortex. No hydrocephalus, mass, or collection Vascular: Major flow voids are preserved Skull and upper cervical spine: Normal marrow signal Sinuses/Orbits: Negative IMPRESSION: Small acute infarct in the posterior left frontal cortex. Some of the diffusion hyperintensity on yesterday's scan was reversible. Advanced, widespread chronic ischemic injury with atrophy. Electronically Signed   By: Tiburcio Pea M.D.   On: 03/28/2023 11:57   ECHOCARDIOGRAM COMPLETE Result Date: 03/27/2023    ECHOCARDIOGRAM REPORT   Patient Name:   Jacqueline Bonilla Date of Exam: 03/27/2023 Medical Rec #:  [id number]          Height:       64.0 in Accession #:    [id number]         Weight:       120.6 lb Date of Birth:  1955/12/15          BSA:          1.578 m Patient Age:    68 years           BP:            136/82 mmHg Patient Gender: F                  HR:           73 bpm. Exam Location:  Inpatient Procedure: 2D Echo (Both Spectral and Color Flow Doppler  were utilized during            procedure). Indications:    stroke  History:        Patient has prior history of Echocardiogram examinations, most                 recent 12/08/2020. Risk Factors:Hypertension and Dyslipidemia.  Sonographer:    Delcie Roch RDCS Referring Phys: [id number] [facility]  Sonographer Comments: Suboptimal subcostal window. IMPRESSIONS  1. Left ventricular ejection fraction, by estimation, is 60 to 65%. The left ventricle has normal function. The left ventricle has no regional wall motion abnormalities. Left ventricular diastolic parameters are consistent with Grade I diastolic dysfunction (impaired relaxation).  2. Right ventricular systolic function is normal. The right ventricular size is normal.  3. The mitral valve is normal in structure. Trivial mitral valve regurgitation. No evidence of mitral stenosis.  4. The aortic valve is normal in structure. Aortic valve regurgitation is not visualized. No aortic stenosis is present.  5. The inferior vena cava is normal in size with greater than 50% respiratory variability, suggesting right atrial pressure of 3 mmHg. Conclusion(s)/Recommendation(s): No intracardiac source of embolism detected on this transthoracic study. Consider a transesophageal echocardiogram to exclude cardiac source of embolism if clinically indicated. FINDINGS  Left Ventricle: Left ventricular ejection fraction, by estimation, is 60 to 65%. The left ventricle has normal function. The left ventricle has no regional wall motion abnormalities. The left ventricular internal cavity size was normal in size. There is  no left ventricular hypertrophy. Left ventricular diastolic parameters are consistent with Grade I diastolic dysfunction (impaired relaxation). Right Ventricle: The right ventricular size is normal. No increase  in right ventricular wall thickness. Right ventricular systolic function is normal. Left Atrium: Left atrial size was normal in size. Right Atrium: Right atrial size was normal in size. Pericardium: There is no evidence of pericardial effusion. Mitral Valve: The mitral valve is normal in structure. Trivial mitral valve regurgitation. No evidence of mitral valve stenosis. Tricuspid Valve: The tricuspid valve is normal in structure. Tricuspid valve regurgitation is not demonstrated. No evidence of tricuspid stenosis. Aortic Valve: The aortic valve is normal in structure. Aortic valve regurgitation is not visualized. No aortic stenosis is present. Pulmonic Valve: The pulmonic valve was normal in structure. Pulmonic valve regurgitation is not visualized. No evidence of pulmonic stenosis. Aorta: The aortic root is normal in size and structure. Venous: The inferior vena cava is normal in size with greater than 50% respiratory variability, suggesting right atrial pressure of 3 mmHg. IAS/Shunts: No atrial level shunt detected by color flow Doppler.  LEFT VENTRICLE PLAX 2D LVIDd:         3.40 cm     Diastology LVIDs:         2.70 cm     LV e' medial:    6.42 cm/s LV PW:         0.70 cm     LV E/e' medial:  8.3 LV IVS:        0.80 cm     LV e' lateral:   11.90 cm/s LVOT diam:     1.90 cm     LV E/e' lateral: 4.5 LV SV:         43 LV SV Index:   27 LVOT Area:     2.84 cm  LV Volumes (MOD) LV vol d, MOD A4C: 57.4 ml LV vol s, MOD A4C: 31.6 ml LV SV MOD A4C:  57.4 ml RIGHT VENTRICLE RV Basal diam:  2.50 cm RV S prime:     13.10 cm/s TAPSE (M-mode): 1.8 cm LEFT ATRIUM             Index        RIGHT ATRIUM           Index LA diam:        3.10 cm 1.96 cm/m   RA Area:     11.90 cm LA Vol (A2C):   39.5 ml 25.03 ml/m  RA Volume:   28.10 ml  17.81 ml/m LA Vol (A4C):   21.8 ml 13.82 ml/m LA Biplane Vol: 29.6 ml 18.76 ml/m  AORTIC VALVE LVOT Vmax:   89.30 cm/s LVOT Vmean:  54.800 cm/s LVOT VTI:    0.153 m  AORTA Ao Root diam:  3.40 cm MITRAL VALVE MV Area (PHT): 3.60 cm    SHUNTS MV Decel Time: 211 msec    Systemic VTI:  0.15 m MV E velocity: 53.40 cm/s  Systemic Diam: 1.90 cm MV A velocity: 81.90 cm/s MV E/A ratio:  0.65 Donato Schultz MD Electronically signed by Donato Schultz MD Signature Date/Time: 03/27/2023/4:13:02 PM    Final    MR BRAIN WO CONTRAST Result Date: 03/27/2023 CLINICAL DATA:  Stroke, follow up. EXAM: MRI HEAD WITHOUT CONTRAST TECHNIQUE: Multiplanar, multiecho pulse sequences of the brain and surrounding structures were obtained without intravenous contrast. COMPARISON:  Head CT 03/27/2023 and MRI 12/02/2020 FINDINGS: An abbreviated examination consisting of axial and coronal diffusion weighted imaging and axial FLAIR imaging was performed. There is a small acute left MCA infarct involving the posterosuperior aspect of the insula and parietal cortex and white matter. There is no significant corresponding cortical FLAIR hyperintensity within the infarct. There are small chronic bilateral frontal, parietal, and occipital cortical infarcts. Confluent T2 hyperintensities in the cerebral white matter bilaterally have progressed from the prior MRI and are nonspecific but compatible with severe chronic small vessel ischemic disease. There is advanced cerebral atrophy. Small chronic bilateral cerebellar infarcts are again noted. No intracranial mass effect or extra-axial fluid collection is identified. No intracranial hemorrhage is identified on this limited examination. IMPRESSION: 1. Small acute left MCA infarct. 2. Severe chronic small vessel ischemic disease and cerebral atrophy. 3. Small chronic bilateral cerebral and cerebellar infarcts. Electronically Signed   By: Sebastian Ache M.D.   On: 03/27/2023 12:10   CT HEAD CODE STROKE WO CONTRAST Result Date: 03/27/2023 CLINICAL DATA:  Code stroke. Neuro deficit, acute, stroke suspected. Aphasia. EXAM: CT HEAD WITHOUT CONTRAST TECHNIQUE: Contiguous axial images were obtained from  the base of the skull through the vertex without intravenous contrast. RADIATION DOSE REDUCTION: This exam was performed according to the departmental dose-optimization program which includes automated exposure control, adjustment of the mA and/or kV according to patient size and/or use of iterative reconstruction technique. COMPARISON:  Head CT and MRI 12/02/2020 FINDINGS: Brain: There is no evidence of an acute infarct, intracranial hemorrhage, mass, midline shift, or extra-axial fluid collection. Advanced cerebral atrophy is again noted. Patchy and confluent hypodensities in the cerebral white matter bilaterally are nonspecific but compatible with moderate to severe chronic small vessel ischemic disease. Small chronic cortical infarcts are again seen bilaterally, predominantly in the MCA territories. There are also chronic lacunar infarcts involving the basal ganglia and cerebellum bilaterally. Vascular: Calcified atherosclerosis at the skull base. No hyperdense vessel. Skull: No acute fracture or suspicious lesion. Sinuses/Orbits: Visualized paranasal sinuses and mastoid air cells are clear. Unremarkable orbits.  Other: None. ASPECTS ([place] Stroke Program Early CT Score) - Ganglionic level infarction (caudate, lentiform nuclei, internal capsule, insula, M1-M3 cortex): 7 - Supraganglionic infarction (M4-M6 cortex): 3 Total score (0-10 with 10 being normal): 10 These results were communicated to Dr. Iver Nestle at 10:09 am on 03/27/2023 by text page via the [facility] messaging system. IMPRESSION: 1. No evidence of acute intracranial abnormality. ASPECTS of 10. 2. Advanced cerebral atrophy and chronic ischemia. Electronically Signed   By: Sebastian Ache M.D.   On: 03/27/2023 10:41   CT ANGIO HEAD NECK W WO CM (CODE STROKE) Result Date: 03/27/2023 CLINICAL DATA:  Neuro deficit, acute, stroke suspected.  Aphasia. EXAM: CT ANGIOGRAPHY HEAD AND NECK WITH AND WITHOUT CONTRAST TECHNIQUE: Multidetector CT imaging of the head and  neck was performed using the standard protocol during bolus administration of intravenous contrast. Multiplanar CT image reconstructions and MIPs were obtained to evaluate the vascular anatomy. Carotid stenosis measurements (when applicable) are obtained utilizing NASCET criteria, using the distal internal carotid diameter as the denominator. RADIATION DOSE REDUCTION: This exam was performed according to the departmental dose-optimization program which includes automated exposure control, adjustment of the mA and/or kV according to patient size and/or use of iterative reconstruction technique. CONTRAST:  75mL OMNIPAQUE IOHEXOL 350 MG/ML SOLN COMPARISON:  MRA head 12/02/2020.  CTA head and neck 12/01/2020. FINDINGS: CTA NECK FINDINGS Aortic arch: Standard branching with mild atherosclerosis. No significant stenosis of the arch vessel origins. Right carotid system: Patent without evidence of stenosis or dissection. Left carotid system: Patent without evidence of stenosis or dissection. Vertebral arteries: Patent and codominant without evidence of stenosis or dissection. Skeleton: Bilateral facet ankylosis at C2-3. Asymmetric, severe left facet arthrosis at C3-4. Mild cervical disc degeneration. Other neck: No evidence of cervical lymphadenopathy or mass. Upper chest: No mass or consolidation in the included lung apices. Review of the MIP images confirms the above findings CTA HEAD FINDINGS Anterior circulation: The internal carotid arteries are patent from skull base to carotid termini with mild atherosclerosis bilaterally not resulting in a significant stenosis. ACAs and MCAs are patent without evidence of a proximal branch occlusion or significant proximal stenosis. There is a severe stenosis of a left distal M2/proximal M3 branch vessel (series 11, image 34). No aneurysm is identified. Posterior circulation: The intracranial vertebral arteries are widely patent to the basilar. Patent PICA and SCA origins are  visualized bilaterally. The basilar artery is widely patent. There is likely a diminutive left posterior communicating artery. Both PCAs are patent without evidence of a significant proximal stenosis on the left. There is a new moderate mid right P2 stenosis, and there is also a new severe more distal right P2 stenosis at a branch point. No aneurysm is identified. Venous sinuses: Poorly evaluated due to contrast timing. Anatomic variants: None. Review of the MIP images confirms the above findings The preliminary finding of no emergent large vessel occlusion was communicated to Dr. Iver Nestle at 10:21 am on 03/27/2023 by text page via the [facility] messaging system. IMPRESSION: 1. No large vessel occlusion. 2. Severe distal left M2/proximal M3 stenosis. 3. Moderate mid and severe distal right P2 stenoses. 4. No significant stenosis in the neck. 5.  Aortic Atherosclerosis (ICD10-I70.0). Electronically Signed   By: Sebastian Ache M.D.   On: 03/27/2023 10:38   CUP PACEART REMOTE DEVICE CHECK Result Date: 03/11/2023 ILR summary report received. Battery status OK. Normal device function. No new symptom, tachy, brady, or pause episodes. No new AF episodes. Monthly summary reports and ROV/PRN  LA, CVRS  EEG adult       [facility] [address] [place]. [place] [address] [phone]      Electroencephalogram Procedure Note Ms. [name] Date of Birth:  [birth year] Medical Record Number:  [id number] Indications: Diagnostic Date of Procedure 03/07/2023 Medications: none Clinical history : 68 year old patient being evaluated for seizures Technical Description This study was performed using 17 channel digital electroencephalographic recording equipment. International 10-20 electrode placement was used. The record was obtained with the patient awake, drowsy, and asleep.  The record is of good technical quality for purposes of interpretation. Activation Procedures:  hyperventilation and photic stimulation . EEG  Description Awake: Alpha Activity: The waking state record contains a well-defined bi-occipital alpha rhythm of  moderate amplitude with a dominant frequency of 9 Hz. Reactivity is uncertain. No paroxsymal activity, spikes, or sharp waves are noted. Technical component of study is adequate. EKG tracing shows regular sinus rhythm Length of this recording is 25 minutes and 6 seconds Sleep: With drowsiness, there is attenuation of the background alpha activity. As the patient enters into light sleep, vertex waves and symmetrical spindles are noted. K complexes are noted in sleep. Transition to the waking state is unremarkable. Result of Activation Procedures: Hyperventilation: Hyperventilation for three minutes fails to activate the recording. Photo Stimulation: No photic driving response is noted. Summary Normal electroencephalogram, awake, asleep and with activation procedures. There are no focal lateralizing or epileptiform features.       HISTORY OF PRESENT ILLNESS 68 y.o. patient with history of hypertension, anxiety and depression, prior left MCA infarct in 2022 admitted for aphasia, difficulty walking and confusion.  NIH on Admission 3   HOSPITAL COURSE  Stroke:  left MCA infarcts s/p TNK, etiology: Likely from left MCA stenosis, large vessel disease  CT head No acute abnormality.  Atrophy. ASPECTS 10.    CTA head & neck distal left M2/proximal M3 stenosis. Moderate mid and severe distal right P2 stenose MRI  small acute left MCA infarct 2D Echo EF 60 to 65% Loop recorder interrogation no A-fib LDL 111 HgbA1c 5.7 UDS positive for THC VTE prophylaxis -Lovenox aspirin 81 mg daily prior to admission, continue aspirin 81 mg daily and Brilinta (ticagrelor) 90 mg bid for 4 weeks and then aspirin alone. (Patient has Plavix allergy). Patient enrolled in [place] stroke research study Therapy recommendations:  CIR Disposition: CIR today  Hx of Stroke/seizure 06/2019 bilateral punctate infarcts, CTA  head and neck showed left M2 moderate stenosis.  Loop recorder placed 06/2020 admitted for left-sided weakness.  CT negative.  MRI showed right ACA infarcts.  MRA and carotid Doppler negative.  EF 55 to 60%.  No DVT.  LDL 108, A1c 5.8.  Discharged on DAPT and Lipitor 40. December 2022 admitted for left MCA scattered small infarcts.  CT no acute abnormality.  CTA head and neck left M2 occlusion.  CTP 0/16 cc.  Status post TNK and IR with TICI2c.  CT post IR showed left small SAH, status post TXA.  Loop recorder no A-fib on interrogation.  LDL 89, A1c 5.7.  Was on aspirin and Brilinta up until February of this year Possible seizure activity during 12/2020 admission with left facial twitching and altered mental status.  EEG showed left spikes.  Loaded with Keppra and continued Keppra on discharge.   Hypertension Home meds: Losartan 25 mg,held Stable Long-term BP goal normotensive   Hyperlipidemia Home meds: CoQ 10, not resumed in hospital LDL 111, goal < 70 Lovastatin 40  mg Continue statin at discharge  Substance Abuse Patient uses THC UDS positive for THC        Ready to quit? Yes TOC consult for cessation placed   Other Stroke Risk Factors Advanced age  Other acute medical issues Vascular dementia Anxiety Depression  DISCHARGE EXAM General:  Alert, well-nourished, well-developed pleasant middle-age Caucasian lady no acute distress Psych:  Mood and affect appropriate for situation CV: Regular rate and rhythm on monitor Respiratory:  Regular, unlabored respirations on room air GI: Abdomen soft and nontender     NEURO:  Mental Status: Awake and alert, oriented to self, place, age, month. Speech/Language: No dysarthria or aphasia. Occasional hesitancy of speech.    Cranial Nerves:  II: PERRL. Visual fields right simultanagnosia III, IV, VI: EOMI. Eyelids elevate symmetrically.  V: Sensation is intact to light touch and symmetrical to face.  ZOX:WRUEAV right facial droop   VIII: hearing intact to voice. IX, X: Palate elevates symmetrically. Phonation is normal.  WU:[id number] shrug 5/5. XII: tongue is midline without fasciculations. Motor: 4+/5 strength in right upper extremity with 4//5 grip.   LUE 5/5, BLE 5/5   Sensation- Intact to light touch bilaterally.  Coordination: RUE slow FTN, no overt ataxia.  Gait- deferred   Most Recent NIH 4 Premorbid baseline modified Rankin score 2   Discharge Diet       Diet   Diet Heart Room service appropriate? Yes with Assist; Fluid consistency: Thin   liquids  DISCHARGE PLAN Disposition: Rehab aspirin 81 mg daily and Brilinta (ticagrelor) 90 mg bid and Librexia stroke study medication for secondary stroke prevention  Ongoing stroke risk factor control by Primary Care Physician at time of discharge Follow-up PCP Georgina Quint, MD in 2 weeks. Follow-up in [facility] in 6-8 weeks, office to schedule an appointment. Able to see NP in clinic.  35 minutes were spent preparing discharge.  Patient seen and examined by NP/APP with MD. MD to update note as needed.   Elmer Picker, DNP, FNP-BC [facility] Pager: [phone]  ATTENDING NOTE: I reviewed above note and agree with the assessment and plan. Pt was seen and examined.   No acute event overnight.  Neuro stable, unchanged.  On DAPT and statin.  Approved by CIR today, pending discharge to CIR.  Follow-up at [facility] as outpatient.  For detailed assessment and plan, please refer to above/below as I have made changes wherever appropriate.   Marvel Plan, MD PhD Stroke Neurology 04/01/2023 4:02 PM

## 2023-03-28 NOTE — Progress Notes (Signed)
  Inpatient Rehab Admissions Coordinator :  Per therapy recommendations, patient was screened for CIR candidacy by Ottie Glazier RN MSN.  At this time patient appears to be a potential candidate for CIR. I will place a rehab consult per protocol for full assessment. Please call me with any questions.  Ottie Glazier RN MSN Admissions Coordinator [phone]

## 2023-03-28 NOTE — TOC Initial Note (Signed)
 Transition of Care [facility]) - Initial/Assessment Note    Patient Details  Name: Jacqueline Bonilla MRN: [id number] Date of Birth: 1955-08-31  Transition of Care [facility]) CM/SW Contact:    Lamonte Sakai, Student-Social Work Phone Number: 03/28/2023, 9:35 AM  Clinical Narrative:                 Pt admitted from home with spouse- undergoing stroke workup. No current TOC needs, please consult as needs arise        Patient Goals and CMS Choice            Expected Discharge Plan and Services       Living arrangements for the past 2 months: Single Family Home                                      Prior Living Arrangements/Services Living arrangements for the past 2 months: Single Family Home Lives with:: Spouse                   Activities of Daily Living   ADL Screening (condition at time of admission) Independently performs ADLs?: No Does the patient have a NEW difficulty with bathing/dressing/toileting/self-feeding that is expected to last >3 days?: Yes (Initiates electronic notice to provider for possible OT consult) Does the patient have a NEW difficulty with getting in/out of bed, walking, or climbing stairs that is expected to last >3 days?: Yes (Initiates electronic notice to provider for possible PT consult) Does the patient have a NEW difficulty with communication that is expected to last >3 days?: Yes (Initiates electronic notice to provider for possible SLP consult) Is the patient deaf or have difficulty hearing?: No Does the patient have difficulty seeing, even when wearing glasses/contacts?: No Does the patient have difficulty concentrating, remembering, or making decisions?: Yes  Permission Sought/Granted                  Emotional Assessment       Orientation: : Oriented to Self, Oriented to Place, Oriented to Situation      Admission diagnosis:  Weakness [R53.1] Stroke (cerebrum) (HCC) [I63.9] Difficulty with speech [R47.9] Patient  Active Problem List   Diagnosis Date Noted   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   Frequent falls 02/11/2023   Crushing injury of right hip 02/11/2023   Statin intolerance 04/24/2022   Internal hemorrhoids 01/10/2021   Dyslipidemia 12/29/2020   History of CVA (cerebrovascular accident) 07/14/2020   Normocytic anemia 07/11/2020   Vascular dementia without behavioral disturbance (HCC) 07/15/2019   History of COVID-19 04/22/2019   Essential hypertension 03/03/2019   PCP:  Georgina Quint, MD Pharmacy:   [facility] [phone] [place], [place] - [address] AT [facility] [address] [name] [address] Phone: [phone] Fax: [phone]  [facility] [place] [facility] [address] [name] [address] Phone: [phone] Fax: [phone]  [facility] #[id number] [place], [place] - [address] AT [facility] [address] & [address] [facility] [address] Phone: [phone] Fax: [phone]     Social Drivers of Health (SDOH) Social History: SDOH Screenings   Food Insecurity: No Food Insecurity (03/27/2023)  Housing: Low Risk  (03/27/2023)  Transportation Needs: No Transportation Needs (03/27/2023)  Utilities: Not At Risk (03/27/2023)  Alcohol Screen: Low Risk  (10/23/2022)  Depression (PHQ2-9): Low Risk  (02/11/2023)  Financial Resource Strain: Low Risk  (10/23/2022)  Physical Activity: Sufficiently Active (10/23/2022)  Social Connections: Moderately Isolated (03/27/2023)  Stress: No Stress Concern Present (08/28/2021)  Tobacco Use: Low Risk  (03/27/2023)  Health Literacy: Inadequate Health Literacy (10/23/2022)   SDOH Interventions:     Readmission Risk Interventions     No data to display

## 2023-03-28 NOTE — Progress Notes (Signed)
 LIBREXIA STROKE RESEARCH STUDY NOTE   Patient is participating in  [place] stroke prevention study ( standard of care antiplatelet therapy plus Milvexian-factor 11 inhibitor versus standard of care antiplatelet therapy plus placebo ).  Patient was given study material to review and informed consent form at 9:50 AM.  Patient was advised to take as much time as she wanted to review the consent form and encouraged to ask questions which were answered.  Patient's  husband was also present at the bedside and was also encouraged to review the material and ask questions.  It was made clear to the patient that study participation is voluntary and patient will still get the same excellent standard of care irrespective of whether she chooses to participate in the study or not.  The benefits of participation in the study as well as the risk involved including but not limited to bleeding as well as alternatives to not participating in the study were clearly discussed with the patient and family who expressed understanding.  The patient was clearly informed that patient has no obligation to's stay in the study for the entire duration and is free to discontinue study medication or study participation if she is dissatisfied at any time in the future.  The research study office visit scheduled was explained to the patient and study obligations were clearly explained as well.  Patient voiced understanding and willingness to participate in the study.  The study inclusion exclusion criteria reviewed by me personally as well as study coordinator and verified that patient met all criteria.  Patient signed informed consent with study coordinator in my presence  at 3: 50PM.  The hospital research pharmacy was notified in advance about potential patient participation and after patient randomization and patient received first dose of medication before discharge and she was discharged home with the study medication bottle and instructed  to call [facility] research office with any questions.  No study specific procedure was done prior to patient signing informed consent form.  A copy of informed consent form and patient Jacqueline Bonilla of Rights signed by the patient was given to her.  Delia Heady, MD

## 2023-03-28 NOTE — Evaluation (Addendum)
 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   Frequent falls 02/11/2023   Crushing injury of right hip 02/11/2023   Statin intolerance 04/24/2022   Internal hemorrhoids 01/10/2021   Dyslipidemia 12/29/2020   History of CVA (cerebrovascular accident) 07/14/2020   Normocytic anemia 07/11/2020   Vascular dementia without behavioral disturbance (HCC) 07/15/2019   History of COVID-19 04/22/2019   Essential hypertension 03/03/2019   Past Medical History:  Past Medical History:  Diagnosis Date   Anemia    Anxiety    Depression    Dysmenorrhea    Endometriosis    Fibroid    Hypertension    Implantable loop recorder present 2021   Microhematuria    negative workup   Osteoporosis    Tachycardia    Thrombocytopenia (HCC)    Past Surgical History:  Past Surgical History:  Procedure Laterality Date   BREAST BIOPSY     BUBBLE STUDY  12/08/2020   Procedure: BUBBLE STUDY;  Surgeon: Wendall Stade, MD;  Location: [facility] ENDOSCOPY;  Service: Cardiovascular;;   CESAREAN SECTION     hysteroscopic resection     implantable loop recorder implant  10/21/2019   Medtronic Reveal Linq model LNQ 22 ([place] [id number]) implantable loop recorder   IR CT HEAD LTD  12/01/2020   IR PERCUTANEOUS ART THROMBECTOMY/INFUSION INTRACRANIAL INC DIAG ANGIO  12/01/2020   IR US GUIDE VASC ACCESS RIGHT  12/01/2020   ORIF HUMERUS FRACTURE Left 07/31/2021   Procedure: OPEN REDUCTION INTERNAL FIXATION (ORIF) DISTAL HUMERUS FRACTURE;  Surgeon: Roby Lofts, MD;  Location: [facility] OR;  Service: Orthopedics;  Laterality: Left;   RADIOLOGY WITH ANESTHESIA N/A 12/01/2020   Procedure: IR WITH ANESTHESIA - CODE STROKE;  Surgeon: Radiologist, Medication, MD;  Location: [facility] OR;   Service: Radiology;  Laterality: N/A;   TEE WITHOUT CARDIOVERSION N/A 12/08/2020   Procedure: TRANSESOPHAGEAL ECHOCARDIOGRAM (TEE);  Surgeon: Wendall Stade, MD;  Location: [facility] ENDOSCOPY;  Service: Cardiovascular;  Laterality: N/A;   HPI:  Pt is a 68 y.o. F who presents 03/27/2023 with expressive aphasia. MRI showing evolving infarct in left hemisphere and she was given TNK. CTA head and neck shows no LVO. Significant PMH: L MCA infarct December 2022, depression, HTN, osteoporosis.   Assessment / Plan / Recommendation Clinical Impression  Original orders d/c'd due to MD report of no acute speech changes however per PT report, patient with significant cognitive deficits warranting evaluation. SLP confimed deficits in the areas of sustained attention, awareness, short term memory, problem solving and executive functioning today as well as some midl residual aphasia based on bedside exam. Unclear if this is baseline from previous CVA or new at this time. She will benefit from acute care f/u to determine baseline from new impairments and potentially improve function.    SLP Assessment  SLP Recommendation/Assessment: Patient needs continued Speech Lanaguage Pathology Services SLP Visit Diagnosis: Attention and concentration deficit Attention and concentration deficit following: Cerebral infarction    Recommendations for follow up therapy are one component of a multi-disciplinary discharge planning process, led by the attending physician.  Recommendations may be updated based on patient status, additional functional criteria and insurance authorization.    Follow Up Recommendations  Outpatient SLP    Assistance Recommended at Discharge  Frequent or constant Supervision/Assistance  Functional Status Assessment Patient has had a recent decline in their functional status and demonstrates the ability to make significant improvements in function in a reasonable and predictable amount of time.  Frequency  and Duration min 2x/week  2 weeks      SLP Evaluation Cognition  Overall Cognitive Status: No family/caregiver present to determine baseline cognitive functioning Arousal/Alertness: Awake/alert Orientation Level: Oriented to person;Oriented to place;Oriented to situation;Disoriented to time Attention: Sustained Sustained Attention: Impaired Sustained Attention Impairment: Verbal basic Memory: Impaired Memory Impairment: Decreased recall of new information;Retrieval deficit Awareness: Impaired Awareness Impairment: Intellectual impairment Problem Solving: Impaired Problem Solving Impairment: Functional complex Executive Function: Sequencing;Organizing Sequencing: Impaired Sequencing Impairment: Functional basic Organizing: Impaired Organizing Impairment: Functional basic Safety/Judgment: Appears intact       Comprehension  Auditory Comprehension Overall Auditory Comprehension: Impaired Yes/No Questions: Within Functional Limits Commands: Impaired Complex Commands: 50-74% accurate Conversation: Simple Interfering Components: Attention Visual Recognition/Discrimination Discrimination: Within Function Limits Reading Comprehension Reading Status: Within funtional limits    Expression Expression Primary Mode of Expression: Verbal Verbal Expression Overall Verbal Expression: Impaired Initiation: No impairment Automatic Speech: Name Level of Generative/Spontaneous Verbalization: Conversation Repetition: No impairment Naming: Impairment Confrontation: Impaired Convergent: 75-100% accurate Pragmatics:  (overannunciating despite no evidence of dysathria) Written Expression Dominant Hand: Right Written Expression: Unable to assess (comment) (very poor right hand dexterity)   Oral / Motor  Oral Motor/Sensory Function Overall Oral Motor/Sensory Function: Within functional limits Motor Speech Overall Motor Speech: Appears within functional limits for tasks assessed            Ferdinand Lango MA, CCC-SLP  Zameria Vogl Meryl 03/28/2023, 11:38 AM

## 2023-03-28 NOTE — Evaluation (Signed)
 Occupational Therapy Evaluation Patient Details Name: Jacqueline Bonilla MRN: [id number] DOB: Jul 22, 1955 Today's Date: 03/28/2023   History of Present Illness   Pt is a 68 y.o. F who presents 03/27/2023 with expressive aphasia. MRI showing evolving infarct in left hemisphere and she was given TNK. CTA head and neck shows no LVO. Significant PMH: L MCA infarct December 2022, depression, HTN, osteoporosis.     Clinical Impressions Patient admitted for the diagnosis above.  PTA she lives at home with her spouse, and remained independent with all ADL, iADL and mobility.  Currently patient is requiring up to Mod A for ADL completion and Min A for in room/toileting with external support at times due to poor dynamic balance.  Patient is also presenting with R visual field neglect, having difficulty locating objects on her R side.  OT will follow in the acute setting to address deficits, and Patient will benefit from intensive inpatient follow-up therapy, >3 hours/day.     If plan is discharge home, recommend the following:   A lot of help with bathing/dressing/bathroom;A little help with walking and/or transfers;Assistance with cooking/housework;Supervision due to cognitive status;Assist for transportation;Help with stairs or ramp for entrance     Functional Status Assessment   Patient has had a recent decline in their functional status and demonstrates the ability to make significant improvements in function in a reasonable and predictable amount of time.     Equipment Recommendations   Tub/shower seat     Recommendations for Other Services         Precautions/Restrictions   Precautions Precautions: Fall Restrictions Weight Bearing Restrictions Per Provider Order: No     Mobility Bed Mobility               General bed mobility comments: up in recliner    Transfers Overall transfer level: Needs assistance Equipment used: None Transfers: Sit to/from Stand, Bed to  chair/wheelchair/BSC Sit to Stand: Contact guard assist     Step pivot transfers: Min assist            Balance Overall balance assessment: Needs assistance Sitting-balance support: Feet supported Sitting balance-Leahy Scale: Good     Standing balance support: Single extremity supported, No upper extremity supported Standing balance-Leahy Scale: Poor                             ADL either performed or assessed with clinical judgement   ADL Overall ADL's : Needs assistance/impaired Eating/Feeding: Minimal assistance;Sitting   Grooming: Minimal assistance;Standing   Upper Body Bathing: Minimal assistance;Sitting   Lower Body Bathing: Moderate assistance;Sit to/from stand   Upper Body Dressing : Moderate assistance;Sitting   Lower Body Dressing: Moderate assistance;Sit to/from stand   Toilet Transfer: Minimal Holiday representative;Ambulation                   Vision Baseline Vision/History: 1 Wears glasses Patient Visual Report: No change from baseline       Perception Perception: Impaired Preception Impairment Details: Inattention/Neglect Perception-Other Comments: ? inattention to R visual field   Praxis Praxis: Impaired Praxis Impairment Details: Limb apraxia     Pertinent Vitals/Pain Pain Assessment Faces Pain Scale: No hurt     Extremity/Trunk Assessment Upper Extremity Assessment Upper Extremity Assessment: Right hand dominant;RUE deficits/detail RUE Deficits / Details: Grossly 4/5 MMT RUE Sensation: WNL RUE Coordination: decreased fine motor;decreased gross motor   Lower Extremity Assessment Lower Extremity Assessment: Defer to PT evaluation RLE Deficits /  Details: Strength 5/5 RLE Sensation: WNL RLE Coordination: decreased gross motor LLE Deficits / Details: Strength 5/5 LLE Sensation: WNL   Cervical / Trunk Assessment Cervical / Trunk Assessment: Normal   Communication Communication Communication: No apparent  difficulties   Cognition Arousal: Alert Behavior During Therapy: WFL for tasks assessed/performed Cognition: Cognition impaired         Attention impairment (select first level of impairment): Focused attention Executive functioning impairment (select all impairments): Reasoning, Problem solving, Sequencing                   Following commands: Impaired Following commands impaired: Follows multi-step commands inconsistently     Cueing  General Comments   Cueing Techniques: Verbal cues      Exercises     Shoulder Instructions      Home Living Family/patient expects to be discharged to:: Private residence Living Arrangements: Spouse/significant other Available Help at Discharge: Family Type of Home: House Home Access: Stairs to enter Secretary/administrator of Steps: 5 Entrance Stairs-Rails: Right Home Layout: One level     Bathroom Shower/Tub: Producer, television/film/video: Handicapped height Bathroom Accessibility: Yes How Accessible: Accessible via walker Home Equipment:  (pt unable to provide)   Additional Comments: Unsure of accuracy of home set up  Lives With: Spouse    Prior Functioning/Environment Prior Level of Function : Patient poor historian/Family not available             Mobility Comments: Pt reports she enjoys walking at baseline ADLs Comments: Retired, 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    OT Problem List: Decreased range of motion;Impaired balance (sitting and/or standing);Impaired vision/perception;Decreased safety awareness;Impaired UE functional use;Impaired tone   OT Treatment/Interventions: Self-care/ADL training;Therapeutic activities;Therapeutic exercise;Energy conservation;Visual/perceptual remediation/compensation;Patient/family education;DME and/or AE instruction;Balance training      OT Goals(Current goals can be found in the care plan section)   Acute Rehab OT Goals Patient Stated Goal: Return home OT Goal  Formulation: With patient Time For Goal Achievement: 04/11/23 Potential to Achieve Goals: Good ADL Goals Pt Will Perform Grooming: with supervision;standing Pt Will Perform Upper Body Dressing: with supervision;sitting Pt Will Perform Lower Body Dressing: with supervision;sit to/from stand Pt Will Transfer to Toilet: with supervision;regular height toilet;ambulating   OT Frequency:  Min 2X/week    Co-evaluation              AM-PAC OT "6 Clicks" Daily Activity     Outcome Measure Help from another person eating meals?: A Little Help from another person taking care of personal grooming?: A Little Help from another person toileting, which includes using toliet, bedpan, or urinal?: A Lot Help from another person bathing (including washing, rinsing, drying)?: A Lot Help from another person to put on and taking off regular upper body clothing?: A Lot Help from another person to put on and taking off regular lower body clothing?: A Lot 6 Click Score: 14   End of Session Equipment Utilized During Treatment: Gait belt Nurse Communication: Mobility status  Activity Tolerance: Patient tolerated treatment well Patient left: in chair;with call bell/phone within reach;with chair alarm set;with family/visitor present  OT Visit Diagnosis: Unsteadiness on feet (R26.81);Muscle weakness (generalized) (M62.81);Apraxia (R48.2);Hemiplegia and hemiparesis Hemiplegia - Right/Left: Right Hemiplegia - dominant/non-dominant: Dominant Hemiplegia - caused by: Cerebral infarction                Time: 1320-1346 OT Time Calculation (min): 26 min Charges:  OT General Charges $OT Visit: 1 Visit OT Evaluation $OT Eval Moderate Complexity:  1 Mod OT Treatments $Self Care/Home Management : 8-22 mins  03/28/2023  RP, OTR/L  Acute Rehabilitation Services  Office:  [phone]   Suzanna Obey 03/28/2023, 1:55 PM

## 2023-03-28 NOTE — Progress Notes (Signed)
 SLP Cancellation Note  Patient Details Name: KYLIEGH JESTER MRN: [id number] DOB: [birth year]   Cancelled treatment:         Per stroke MD, no need for cognitive-linguistic evaluation at this time. Will sign off. Please reconsult if needed.   Amylynn Fano MA, CCC-SLP    Terrance Usery Meryl 03/28/2023, 10:04 AM

## 2023-03-28 NOTE — Progress Notes (Signed)
 [place] [facility] New Study Start: LIBREXIA-STROKE   SUMMARY For more information refer to: [email or web address] Study Identifier: [id number] A Phase 3, Randomized, Double-Blind, Parallel-Group, Placebo-Controlled Study to Demonstrate the Efficacy and Safety of Milvexian, an Oral Factor XIa Inhibitor, for Stroke Prevention after an Acute Ischemic Stroke or High-Risk Transient Ischemic Attack  Brief Summary This study will evaluate the efficacy and safety of milvexian in participants after an acute ischemic stroke or high-risk TIA who are receiving antiplatelet therapy standard-of-care.  Design Phase 3, Randomized, Double-Blind, Interventional, Event-Driven  Intervention [id number] (Milvexian) 25 mg or Placebo     Concomitant Therapy Participants will receive SAPT or DAPT. The SAPT or DAPT may be started prior to randomization and the type of antiplatelet agent(s), and duration of treatment will be at the discretion of the investigator. If ASA is used, it will be limited to low dose (75 to 100 mg/day) NSAID (except ASA) may be used concomitantly on a temporary basis but should be avoided for chronic use more than 4 weeks of consecutive therapy)  Prohibited Therapy Chronic (>4 weeks of consecutive use) use of ASA >100 mg per day Current or planned use of isoniazid Concomitant use of omeprazole or esomeprazole with clopidogrel is prohibited. Other use of PPI is allowed and encouraged Additional anticoagulants (e.g., vitamin k antagonists, factor IIa or FXa inhibitors) Use of a combined P-gp and strong CYP3A4 inhibitor (e.g., atazanavir, clarithromycin, itraconazole, ketoconazole, ritonavir, saquinavir) within 7 days of receiving study intervention and during the study is prohibited Use of a combined P-gp and strong CYP3A4 inducer (e.g., carbamazepine, phenytoin, rifampin) within 7 days of receiving study intervention and during the study is prohibited * Prohibited therapies  may be administered on a temporary basis, and if administered, the investigator should discontinue the study intervention. Study intervention may be restarted after the prohibited therapy has been discontinued and after the completion of a suitable washout period at the investigator's discretion  Anticoagulation Prophylaxis The use of anticoagulants for post-stroke DVT prophylaxis after the 3-day window is prohibited and non-pharmacological prophylaxis (e.g., intermittent pneumatic compression) is recommended.   Potential Drug-Drug Interactions Milvexian metabolism: Substrate of CYP3A4; use caution with coadministration of strong CYP3A4 inducers and inhibitors  Administration  Take 1 tablet by mouth twice daily without regards to food intake, at approximately the same time each day. For participants unable to swallow medication, the tablet can be dispersed in water and given via NG tube or in applesauce.  Missed dose If a dose of medication is missed, the dose should be taken as soon as possible. If the missed dose cannot be taken at regular time, next dose should not be doubled. Resume at the next scheduled dose      Plan: Start [Milvexian 25 mg tablets or placebo] today. Study medication must be picked up from pharmacy, medication can not be tubed.    Please contact [facility] if any questions or concerns regarding the study medication.    Mardene Sayer, PharmD, BCPS Investigational Drug Service Pharmacist  [phone]

## 2023-03-28 NOTE — Progress Notes (Addendum)
  Inpatient Rehabilitation Admissions Coordinator   Met with patient at bedside for rehab assessment. We discussed goals and expectations of a possible CIR admit. She prefers CIR for rehab. Spouse works. I asked her to clarify caregiver supports with her spouse and to have him call me upon his arrival. Dr Shearon Stalls to consult also today. Please call me with any questions.   Ottie Glazier, RN, MSN Rehab Admissions Coordinator [phone]    I spoke with spouse by phone. He is retired for the past 4 years and can provide 24/7 supervision. He would like to pursue CIR admit. I will begin Auth today.  Ottie Glazier, RN, MSN Rehab Admissions Coordinator [phone] 03/28/2023 1:29 PM

## 2023-03-29 ENCOUNTER — Ambulatory Visit (HOSPITAL_COMMUNITY)

## 2023-03-29 DIAGNOSIS — F1210 Cannabis abuse, uncomplicated: Secondary | ICD-10-CM | POA: Diagnosis not present

## 2023-03-29 DIAGNOSIS — E785 Hyperlipidemia, unspecified: Secondary | ICD-10-CM | POA: Diagnosis not present

## 2023-03-29 DIAGNOSIS — I63512 Cerebral infarction due to unspecified occlusion or stenosis of left middle cerebral artery: Secondary | ICD-10-CM | POA: Diagnosis not present

## 2023-03-29 DIAGNOSIS — I10 Essential (primary) hypertension: Secondary | ICD-10-CM | POA: Diagnosis not present

## 2023-03-29 NOTE — TOC Progression Note (Addendum)
 Transition of Care [facility]) - Progression Note    Patient Details  Name: Jacqueline Bonilla MRN: [id number] Date of Birth: 1955-11-22  Transition of Care [facility]) CM/SW Contact  Baldemar Lenis, [name] Phone Number: 03/29/2023, 11:29 AM  Clinical Narrative:   CSW spoke with spouse, Jacqueline Bonilla, about plan for disposition just in case [employer] continues to deny CIR. Spouse continues to prefer CIR, but if [employer] denies, then he would be interested in SNF. CSW completed referral, will email CMS choice list for spouse to review. Spouse appreciative of information. CSW to follow.  UPDATE: CSW spoke with spouse, Jacqueline Bonilla, and emailed CMS choice list. CSW to follow.    Expected Discharge Plan: IP Rehab Facility Barriers to Discharge: Continued Medical Work up, English as a second language teacher  Expected Discharge Plan and Services     Post Acute Care Choice: IP Rehab Living arrangements for the past 2 months: Single Family Home                                       Social Determinants of Health (SDOH) Interventions SDOH Screenings   Food Insecurity: No Food Insecurity (03/27/2023)  Housing: Low Risk  (03/27/2023)  Transportation Needs: No Transportation Needs (03/27/2023)  Utilities: Not At Risk (03/27/2023)  Alcohol Screen: Low Risk  (10/23/2022)  Depression (PHQ2-9): Low Risk  (02/11/2023)  Financial Resource Strain: Low Risk  (10/23/2022)  Physical Activity: Sufficiently Active (10/23/2022)  Social Connections: Moderately Isolated (03/27/2023)  Stress: No Stress Concern Present (08/28/2021)  Tobacco Use: Low Risk  (03/27/2023)  Health Literacy: Inadequate Health Literacy (10/23/2022)    Readmission Risk Interventions     No data to display

## 2023-03-29 NOTE — Progress Notes (Addendum)
 STROKE TEAM PROGRESS NOTE   3/26: difficulty walking and confusion.  She was given TNK MRI brain showed small acute left MCA infarct and similar area as her previous stroke  INTERIM HISTORY/SUBJECTIVE  No family at the bedside.Stable neuro exam.  Started on aspirin and Brilinta yesterday.  She is pending CIR for discharge. She is participating in the [place] stroke prevention trial.  Neurological exam is unchanged vital signs are stable.  CBC    Component Value Date/Time   WBC 5.0 03/27/2023 0947   RBC 4.39 03/27/2023 0947   HGB 13.6 03/27/2023 0953   HGB 13.3 06/11/2019 1431   HGB 12.4 08/04/2013 1620   HCT 40.0 03/27/2023 0953   HCT 40.2 06/11/2019 1431   PLT 262 03/27/2023 0947   PLT 160 06/11/2019 1431   MCV 90.9 03/27/2023 0947   MCV 93.7 07/20/2019 0950   MCV 91 06/11/2019 1431   MCH 28.7 03/27/2023 0947   MCHC 31.6 03/27/2023 0947   RDW 14.2 03/27/2023 0947   RDW 12.0 06/11/2019 1431   LYMPHSABS 1.0 03/27/2023 0947   LYMPHSABS 1.3 06/11/2019 1431   MONOABS 0.5 03/27/2023 0947   EOSABS 0.1 03/27/2023 0947   EOSABS 0.1 06/11/2019 1431   BASOSABS 0.1 03/27/2023 0947   BASOSABS 0.1 06/11/2019 1431    BMET    Component Value Date/Time   NA 140 03/27/2023 0953   NA 137 07/20/2019 1058   K 4.2 03/27/2023 0953   CL 103 03/27/2023 0953   CO2 27 03/27/2023 0947   GLUCOSE 104 (H) 03/27/2023 0953   BUN 21 03/27/2023 0953   BUN 20 07/20/2019 1058   CREATININE 0.90 03/27/2023 0953   CREATININE 0.91 07/11/2015 1517   CALCIUM 9.6 03/27/2023 0947   GFRNONAA >60 03/27/2023 0947   GFRNONAA 68 07/11/2015 1517    IMAGING past 24 hours MR BRAIN WO CONTRAST Result Date: 03/28/2023 CLINICAL DATA:  Stroke follow-up. Neuro deficit with stroke suspected EXAM: MRI HEAD WITHOUT CONTRAST TECHNIQUE: Multiplanar, multiecho pulse sequences of the brain and surrounding structures were obtained without intravenous contrast. COMPARISON:  Brain MRI from yesterday FINDINGS: Brain:  Coalescence of the area of restricted diffusion in the left posterior frontal lobe, now mainly isolated to the cortex and more intense. Preexistent extensive chronic infarction along the frontal parietal convexities with brain atrophy. Numerous small chronic infarcts in the bilateral cerebellum. 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 Small vessel ischemic gliosis seen in the cerebral white matter and bilateral thalamus. There has been atrophy of the deep gray nuclei in addition to the cortex. No hydrocephalus, mass, or collection Vascular: Major flow voids are preserved Skull and upper cervical spine: Normal marrow signal Sinuses/Orbits: Negative IMPRESSION: Small acute infarct in the posterior left frontal cortex. Some of the diffusion hyperintensity on yesterday's scan was reversible. Advanced, widespread chronic ischemic injury with atrophy. Electronically Signed   By: Tiburcio Pea M.D.   On: 03/28/2023 11:57    Vitals:   03/29/23 0100 03/29/23 0400 03/29/23 0833 03/29/23 1019  BP: 134/81 (!) 142/78 118/80 118/80  Pulse: 90 80 99   Resp: 18 17 15    Temp: 98.5 F (36.9 C) 98.6 F (37 C) 99.3 F (37.4 C)   TempSrc: Oral Oral Oral   SpO2: 100% 100% 91%   Weight:      Height:         PHYSICAL EXAM General:  Alert, well-nourished, well-developed pleasant middle-age Caucasian lady no acute distress  Psych:  Mood and affect appropriate for situation CV: Regular rate and rhythm on monitor Respiratory:  Regular, unlabored respirations on room air GI: Abdomen soft and nontender   NEURO:  Mental Status: AA&O oriented to self, place, month, age. Speech/Language: Mild aphasia continues.  Cranial Nerves:  II: PERRL. Visual fields partial right homonymous hemianopsia III, IV, VI: EOMI. Eyelids elevate symmetrically.  V: Sensation is intact to light touch and symmetrical to face.  ZOX:WRUEAV right facial droop   VIII: hearing intact to voice. IX, X: Palate elevates symmetrically. Phonation is normal.  WU:[id number] shrug 5/5. XII: tongue is midline without fasciculations. Motor: 4/5 strength in right upper extremity with 4-//5 grip and intrinsic hand muscles.  Tone is increased on the right compared to the left.  Left upper extremity is 5 out of 5.  Both lower extremities are 5 out of 5..    Sensation- Intact to light touch bilaterally. Extinction absent to light touch to DSS.   Coordination: Mild right upper extremity finger-to-nose ataxia Gait- deferred  Most Recent NIH 4 Premorbid baseline modified Rankin score 2  ASSESSMENT/PLAN  [name] is a 68 y.o. female with history of hypertension, anxiety and depression, prior left MCA infarct in 2022 admitted for aphasia, difficulty walking and confusion.  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  Atrophy. ASPECTS 10.    CTA head & neck no LVO evere distal left M2/proximal M3 stenosis. Moderate mid and severe distal right P2 stenose MRI  small acute left MCA infarct 2D Echo EF 60 to 65%.  LV with grade 1 diastolic dysfunction Loop recorder placed in 2021 Interrogated by EPS, No recent AF episodes noted LDL 111 HgbA1c 5.7 VTE prophylaxis -Lovenox aspirin 81 mg daily prior to admission, continue aspirin 81 mg daily and Brilinta (ticagrelor) 90 mg bid for 4 weeks and then aspirin alone. (Patient has Plavix allergy) Patient enrolled in [place] stroke research study Therapy recommendations:  CIR Disposition: Pending  Hx of Stroke/TIA December 2022-was on aspirin and Brilinta up until February of this year  Hypertension Home meds: Losartan 25 mg Stable Blood Pressure Goal: SBP less than 160   Hyperlipidemia Home meds: CoQ 10, not resumed in hospital LDL 111, goal < 70 Lovastatin 40 mg Continue statin at  discharge  Substance Abuse Patient uses [facility] UDS positive for  [facility]        Ready to quit? Yes TOC consult for cessation placed  Other Stroke Risk Factors ETOH use, alcohol level <10, advised to drink no more than 1 drink(s) a day  Hospital day # 2    Pt seen by Neuro NP/APP and later by MD. Note/plan to be edited by MD as needed.    Jacqueline January, DNP, AGACNP-BC [facility] Please use AMION for contact information & EPIC for messaging.  I have personally obtained history,examined this patient, reviewed notes, independently viewed imaging studies, participated in medical decision making and plan of care.ROS completed by me personally and pertinent positives fully documented  I have made any additions or clarifications directly to the above note. Agree with note above.  Continue aspirin and Brilinta for 4 weeks followed by aspirin alone.  Patient is enrolled in the [place] stroke research study.  Continue ongoing therapies and transfer to inpatient rehab when bed available.  Patient's insurance refused inpatient rehab and rehab team plans to appeal this decision.  Long discussion with patient and care team and answered questions.  Greater than 50% time in 35-minute visit was spent in counseling and coordination of care and discussion patient care team and answering questions.  Delia Heady, MD Medical Director [facility] Pager: [phone] 03/29/2023 3:28 PM

## 2023-03-29 NOTE — NC FL2 (Signed)
 [place] MEDICAID FL2 LEVEL OF CARE FORM     IDENTIFICATION  Patient Name: Jacqueline Bonilla Birthdate: [birth year] Sex: female Admission Date (Current Location): 03/27/2023  [facility] and [place] Number:  Producer, television/film/video and Address:  [facility] [place], [address], [place], [name] [address]      Provider Number: [id number]  Attending Physician Name and Address:  Stroke, Md, MD  Relative Name and Phone Number:       Current Level of Care: Hospital Recommended Level of Care: Skilled Nursing Facility Prior Approval Number:    Date Approved/Denied:   PASRR Number: [id number]  Discharge Plan: SNF    Current Diagnoses: Patient Active Problem List   Diagnosis Date Noted   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   Frequent falls 02/11/2023   Crushing injury of right hip 02/11/2023   Statin intolerance 04/24/2022   Internal hemorrhoids 01/10/2021   Dyslipidemia 12/29/2020   History of CVA (cerebrovascular accident) 07/14/2020   Normocytic anemia 07/11/2020   Vascular dementia without behavioral disturbance (HCC) 07/15/2019   History of COVID-19 04/22/2019   Essential hypertension 03/03/2019    Orientation RESPIRATION BLADDER Height & Weight     Self, Time, Situation, Place  Normal Continent Weight: 114 lb 6.7 oz (51.9 kg) Height:  5\' 4"  (162.6 cm)  BEHAVIORAL SYMPTOMS/MOOD NEUROLOGICAL BOWEL NUTRITION STATUS      Continent Diet (heart healthy)  AMBULATORY STATUS COMMUNICATION OF NEEDS Skin   Limited Assist Verbally Normal                       Personal Care Assistance Level of Assistance  Bathing, Feeding, Dressing Bathing Assistance: Limited assistance Feeding assistance: Independent Dressing Assistance: Limited assistance     Functional Limitations Info  Sight Sight Info: Impaired        SPECIAL CARE FACTORS FREQUENCY  PT (By licensed PT), OT (By licensed OT)     PT Frequency: 5x/wk OT Frequency: 5x/wk             Contractures Contractures Info: Not present    Additional Factors Info  Code Status, Allergies Code Status Info: Full Allergies Info: Avelox (Moxifloxacin), Ciprofloxacin, Floraquin (Iodoquinol), Iohexol, Levaquin (Levofloxacin), Plavix (Clopidogrel), Quinolones, Shellfish Allergy           Current Medications (03/29/2023):  This is the current hospital active medication list Current Facility-Administered Medications  Medication Dose Route Frequency Provider Last Rate Last Admin   acetaminophen (TYLENOL) tablet 650 mg  650 mg Oral Q4H PRN Elmer Picker, NP       Or   acetaminophen (TYLENOL) 160 MG/5ML solution 650 mg  650 mg Per Tube Q4H PRN Elmer Picker, NP       Or   acetaminophen (TYLENOL) suppository 650 mg  650 mg Rectal Q4H PRN Elmer Picker, NP       aspirin chewable tablet 81 mg  81 mg Oral Daily Micki Riley, MD   81 mg at 03/29/23 5621   atorvastatin (LIPITOR) tablet 40 mg  40 mg Oral Daily Micki Riley, MD   40 mg at 03/29/23 3086   Chlorhexidine Gluconate Cloth 2 % PADS 6 each  6 each Topical Q0600 Micki Riley, MD   6 each at 03/29/23 0708   Oral care mouth rinse  15 mL Mouth Rinse PRN Micki Riley, MD       senna-docusate (Senokot-S) tablet 1 tablet  1 tablet Oral QHS PRN Elmer Picker, NP  sodium chloride 0.9 % bolus 500 mL  500 mL Intravenous Once Linwood Dibbles, MD       sodium chloride flush (NS) 0.9 % injection 3 mL  3 mL Intravenous Once Linwood Dibbles, MD       Study - LIBREXIA-STROKE - milvexian 25 mg or placebo tablet (PI-Sethi)  1 tablet Oral BID Micki Riley, MD   1 tablet at 03/29/23 9147   ticagrelor (BRILINTA) tablet 90 mg  90 mg Oral BID Micki Riley, MD   90 mg at 03/29/23 8295     Discharge Medications: Please see discharge summary for a list of discharge medications.  Relevant Imaging Results:  Relevant Lab Results:   Additional Information SS#: [id number]  Baldemar Lenis, LCSW

## 2023-03-29 NOTE — Progress Notes (Signed)
 Inpatient Rehabilitation Admissions Coordinator   I have received a denial from [facility] for CIR admit after peer to peer with Dr Shearon Stalls and [facility] MD. I contacted patient's spouse by phone and he would like to pursue appeal. I will begin appeal today.  Ottie Glazier, RN, MSN Rehab Admissions Coordinator [phone] 03/29/2023 10:10 AM

## 2023-03-29 NOTE — Plan of Care (Signed)
  Problem: Education: Goal: Knowledge of disease or condition will improve 03/29/2023 1602 by Sabra Heck, RN Outcome: Progressing 03/29/2023 1018 by Sabra Heck, RN Outcome: Progressing Goal: Knowledge of secondary prevention will improve (MUST DOCUMENT ALL) 03/29/2023 1602 by Sabra Heck, RN Outcome: Progressing 03/29/2023 1018 by Sabra Heck, RN Outcome: Progressing Goal: Knowledge of patient specific risk factors will improve (DELETE if not current risk factor) 03/29/2023 1602 by Sabra Heck, RN Outcome: Progressing 03/29/2023 1018 by Sabra Heck, RN Outcome: Progressing   Problem: Ischemic Stroke/TIA Tissue Perfusion: Goal: Complications of ischemic stroke/TIA will be minimized 03/29/2023 1602 by Sabra Heck, RN Outcome: Progressing 03/29/2023 1018 by Sabra Heck, RN Outcome: Progressing   Problem: Coping: Goal: Will verbalize positive feelings about self 03/29/2023 1602 by Sabra Heck, RN Outcome: Progressing 03/29/2023 1018 by Sabra Heck, RN Outcome: Progressing Goal: Will identify appropriate support needs 03/29/2023 1602 by Sabra Heck, RN Outcome: Progressing 03/29/2023 1018 by Sabra Heck, RN Outcome: Progressing   Problem: Health Behavior/Discharge Planning: Goal: Ability to manage health-related needs will improve 03/29/2023 1602 by Sabra Heck, RN Outcome: Progressing 03/29/2023 1018 by Sabra Heck, RN Outcome: Progressing Goal: Goals will be collaboratively established with patient/family 03/29/2023 1602 by Sabra Heck, RN Outcome: Progressing 03/29/2023 1018 by Sabra Heck, RN Outcome: Progressing

## 2023-03-29 NOTE — Plan of Care (Signed)
  Problem: Education: Goal: Knowledge of disease or condition will improve Outcome: Progressing Goal: Knowledge of secondary prevention will improve (MUST DOCUMENT ALL) Outcome: Progressing Goal: Knowledge of patient specific risk factors will improve (DELETE if not current risk factor) Outcome: Progressing   Problem: Ischemic Stroke/TIA Tissue Perfusion: Goal: Complications of ischemic stroke/TIA will be minimized Outcome: Progressing   Problem: Coping: Goal: Will verbalize positive feelings about self Outcome: Progressing Goal: Will identify appropriate support needs Outcome: Progressing   Problem: Health Behavior/Discharge Planning: Goal: Ability to manage health-related needs will improve Outcome: Progressing Goal: Goals will be collaboratively established with patient/family Outcome: Progressing   Problem: Self-Care: Goal: Ability to participate in self-care as condition permits will improve Outcome: Progressing Goal: Verbalization of feelings and concerns over difficulty with self-care will improve Outcome: Progressing Goal: Ability to communicate needs accurately will improve Outcome: Progressing

## 2023-03-29 NOTE — Progress Notes (Signed)
 Physical Therapy Treatment Patient Details Name: Jacqueline Bonilla MRN: [id number] DOB: 1955/11/06 Today's Date: 03/29/2023   History of Present Illness Pt is a 68 y.o. F who presents 03/27/2023 with expressive aphasia. MRI showing evolving infarct in left hemisphere and she was given TNK. CTA head and neck shows no LVO. Significant PMH: L MCA infarct December 2022, depression, HTN, osteoporosis.    PT Comments  Pt is progressing towards goals. Currently pt is at a High risk for falls scoring 9/24 on the DGI. Pt is CGA for sit to stand and Min A to CGA for gait. Pt has very supportive spouse that can provide supervision assist. Due to pt current functional status, home set up and available assistance at home recommending skilled physical therapy services > 3 hours/day in order to address strength, balance and functional mobility to decrease risk for falls, injury, immobility, skin break down and re-hospitalization.     If plan is discharge home, recommend the following: A little help with walking and/or transfers;Assistance with cooking/housework;Assist for transportation;Help with stairs or ramp for entrance     Equipment Recommendations  Rolling walker (2 wheels);BSC/3in1       Precautions / Restrictions Precautions Precautions: Fall Recall of Precautions/Restrictions: Intact Restrictions Weight Bearing Restrictions Per Provider Order: No     Mobility  Bed Mobility     General bed mobility comments: up in recliner on arrival and departure.    Transfers Overall transfer level: Needs assistance Equipment used: None Transfers: Sit to/from Stand Sit to Stand: Contact guard assist           General transfer comment: Steadying assist to rise to stand from recliner    Ambulation/Gait Ambulation/Gait assistance: Min assist, Contact guard assist Gait Distance (Feet): 300 Feet Assistive device: 1 person hand held assist Gait Pattern/deviations: Step-through pattern, Decreased  stride length, Decreased dorsiflexion - right, Decreased dorsiflexion - left, Shuffle, Narrow base of support Gait velocity: decreased Gait velocity interpretation: 1.31 - 2.62 ft/sec, indicative of limited community ambulator   General Gait Details: Decreased bilateral foot clearance and heel strike at initial contact. Consistent minA for balance and cues for environmental navigation. Decreased gait speed.   Stairs Stairs: Yes Stairs assistance: Contact guard assist Stair Management: One rail Right, Forwards, Backwards Number of Stairs: 1 General stair comments: HHA    Modified Rankin (Stroke Patients Only) Modified Rankin (Stroke Patients Only) Pre-Morbid Rankin Score: Slight disability Modified Rankin: Moderately severe disability     Balance Overall balance assessment: Needs assistance Sitting-balance support: Feet supported Sitting balance-Leahy Scale: Good     Standing balance support: Single extremity supported, No upper extremity supported Standing balance-Leahy Scale: Poor Standing balance comment: External support by therapist   Standardized Balance Assessment Standardized Balance Assessment : Dynamic Gait Index   Dynamic Gait Index Level Surface: Mild Impairment Change in Gait Speed: Moderate Impairment Gait with Horizontal Head Turns: Moderate Impairment Gait with Vertical Head Turns: Severe Impairment Gait and Pivot Turn: Severe Impairment Step Over Obstacle: Mild Impairment Step Around Obstacles: Mild Impairment Steps: Moderate Impairment Total Score: 9      Communication Communication Communication: Impaired Factors Affecting Communication: Reduced clarity of speech  Cognition Arousal: Alert Behavior During Therapy: WFL for tasks assessed/performed   PT - Cognitive impairments: History of cognitive impairments, Awareness, Problem solving     Following commands: Intact Following commands impaired: Follows multi-step commands with increased  time    Cueing Cueing Techniques: Verbal cues     General Comments General comments (skin integrity,  edema, etc.): No noted skin issues. No sign/symptoms of cardiac/respirtory distress with activity      Pertinent Vitals/Pain Pain Assessment Pain Assessment: 0-10 Pain Score: 1  Pain Location: R hand Pain Descriptors / Indicators: Aching, Discomfort Pain Intervention(s): Monitored during session     PT Goals (current goals can now be found in the care plan section) Acute Rehab PT Goals Patient Stated Goal: to improve PT Goal Formulation: With patient Time For Goal Achievement: 04/11/23 Potential to Achieve Goals: Good Progress towards PT goals: Progressing toward goals    Frequency    Min 3X/week      PT Plan  Continue with current POC        AM-PAC PT "6 Clicks" Mobility   Outcome Measure  Help needed turning from your back to your side while in a flat bed without using bedrails?: A Little Help needed moving from lying on your back to sitting on the side of a flat bed without using bedrails?: A Little Help needed moving to and from a bed to a chair (including a wheelchair)?: A Little Help needed standing up from a chair using your arms (e.g., wheelchair or bedside chair)?: A Little Help needed to walk in hospital room?: A Little Help needed climbing 3-5 steps with a railing? : A Lot 6 Click Score: 17    End of Session Equipment Utilized During Treatment: Gait belt Activity Tolerance: Patient tolerated treatment well Patient left: in chair;with call bell/phone within reach;with chair alarm set;with family/visitor present Nurse Communication: Mobility status PT Visit Diagnosis: Unsteadiness on feet (R26.81);Other abnormalities of gait and mobility (R26.89)     Time: 1207-1220 PT Time Calculation (min) (ACUTE ONLY): 13 min  Charges:    $Therapeutic Activity: 8-22 mins PT General Charges $$ ACUTE PT VISIT: 1 Visit                     Harrel Carina,  DPT, CLT  Acute Rehabilitation Services Office: [phone] (Secure chat preferred)    Claudia Desanctis 03/29/2023, 12:26 PM

## 2023-03-30 DIAGNOSIS — R29704 NIHSS score 4: Secondary | ICD-10-CM | POA: Diagnosis not present

## 2023-03-30 DIAGNOSIS — Z7902 Long term (current) use of antithrombotics/antiplatelets: Secondary | ICD-10-CM

## 2023-03-30 DIAGNOSIS — F1210 Cannabis abuse, uncomplicated: Secondary | ICD-10-CM | POA: Diagnosis not present

## 2023-03-30 DIAGNOSIS — I63512 Cerebral infarction due to unspecified occlusion or stenosis of left middle cerebral artery: Secondary | ICD-10-CM | POA: Diagnosis not present

## 2023-03-30 DIAGNOSIS — E785 Hyperlipidemia, unspecified: Secondary | ICD-10-CM | POA: Diagnosis not present

## 2023-03-30 NOTE — Plan of Care (Signed)
  Problem: Nutrition: Goal: Risk of aspiration will decrease Outcome: Progressing   Problem: Clinical Measurements: Goal: Will remain free from infection Outcome: Progressing   Problem: Coping: Goal: Will verbalize positive feelings about self Outcome: Progressing

## 2023-03-30 NOTE — Progress Notes (Addendum)
 STROKE TEAM PROGRESS NOTE   3/26: difficulty walking and confusion --> given TNK MRI brain showed small acute left MCA infarct, similar area as her previous stroke  INTERIM HISTORY/SUBJECTIVE  Husband at bedside.  Patient sitting up in chair. No acute events overnight, neurological exam and vital signs stable. Pending insurance appeal for CIR discharge.   CBC    Component Value Date/Time   WBC 5.0 03/27/2023 0947   RBC 4.39 03/27/2023 0947   HGB 13.6 03/27/2023 0953   HGB 13.3 06/11/2019 1431   HGB 12.4 08/04/2013 1620   HCT 40.0 03/27/2023 0953   HCT 40.2 06/11/2019 1431   PLT 262 03/27/2023 0947   PLT 160 06/11/2019 1431   MCV 90.9 03/27/2023 0947   MCV 93.7 07/20/2019 0950   MCV 91 06/11/2019 1431   MCH 28.7 03/27/2023 0947   MCHC 31.6 03/27/2023 0947   RDW 14.2 03/27/2023 0947   RDW 12.0 06/11/2019 1431   LYMPHSABS 1.0 03/27/2023 0947   LYMPHSABS 1.3 06/11/2019 1431   MONOABS 0.5 03/27/2023 0947   EOSABS 0.1 03/27/2023 0947   EOSABS 0.1 06/11/2019 1431   BASOSABS 0.1 03/27/2023 0947   BASOSABS 0.1 06/11/2019 1431    BMET    Component Value Date/Time   NA 140 03/27/2023 0953   NA 137 07/20/2019 1058   K 4.2 03/27/2023 0953   CL 103 03/27/2023 0953   CO2 27 03/27/2023 0947   GLUCOSE 104 (H) 03/27/2023 0953   BUN 21 03/27/2023 0953   BUN 20 07/20/2019 1058   CREATININE 0.90 03/27/2023 0953   CREATININE 0.91 07/11/2015 1517   CALCIUM 9.6 03/27/2023 0947   GFRNONAA >60 03/27/2023 0947   GFRNONAA 68 07/11/2015 1517    IMAGING past 24 hours No results found.   Vitals:   03/29/23 1949 03/29/23 2344 03/30/23 0355 03/30/23 1112  BP: 124/76 130/84 129/76 125/71  Pulse: 82 91 83 94  Resp: 18 18 18 17   Temp: (!) 97.5 F (36.4 C) 98.6 F (37 C) 98.2 F (36.8 C) 98.1 F (36.7 C)  TempSrc: Oral Oral Oral Oral  SpO2: 100% 96% 98% 99%  Weight:      Height:         PHYSICAL EXAM General:  Alert, well-nourished, well-developed pleasant middle-age  Caucasian lady no acute distress Psych:  Mood and affect appropriate for situation CV: Regular rate and rhythm on monitor Respiratory:  Regular, unlabored respirations on room air GI: Abdomen soft and nontender   NEURO:  Mental Status: AA&O oriented to self, place, month, age. Speech/Language: No dysarthria or aphasia. Occasional hesitancy of speech.   Cranial Nerves:  II: PERRL. Visual fields partial right homonymous hemianopsia III, IV, VI: EOMI. Eyelids elevate symmetrically.  V: Sensation is intact to light touch and symmetrical to face.  GNF:[id number] right facial droop  VIII: hearing intact to voice. IX, X: Palate elevates symmetrically. Phonation is normal.  MV:HQIONGEX shrug 5/5. XII: tongue is midline without fasciculations. Motor: 4+/5 strength in right upper extremity with 4-//5 grip.  Left upper extremity is 5 out of 5. Both lower extremities are 5 out of 5..    Sensation- Intact to light touch bilaterally. Extinction absent to light touch to DSS.   Coordination: Mild right upper extremity finger-to-nose ataxia Gait- deferred  Most Recent NIH 4 Premorbid baseline modified Rankin score 2  ASSESSMENT/PLAN  Ms. KAROLE OO is a 68 y.o. female with history of hypertension, anxiety and depression, prior left MCA infarct in 2022 admitted for aphasia,  difficulty walking and confusion.  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  Atrophy. ASPECTS 10.    CTA head & neck distal left M2/proximal M3 stenosis. Moderate mid and severe distal right P2 stenose MRI  small acute left MCA infarct 2D Echo EF 60 to 65% Loop recorder interrogation no A-fib LDL 111 HgbA1c 5.7 UDS positive for THC VTE prophylaxis -Lovenox aspirin 81 mg daily prior to admission, continue aspirin 81 mg daily and Brilinta (ticagrelor) 90 mg bid for 4 weeks and then aspirin alone. (Patient has  Plavix allergy). Patient enrolled in [place] stroke research study Therapy recommendations:  CIR Disposition: Pending insurance appeal for CIR approval  Hx of Stroke/seizure 06/2019 bilateral punctate infarcts, CTA head and neck showed left M2 moderate stenosis.  Loop recorder placed 06/2020 admitted for left-sided weakness.  CT negative.  MRI showed right ACA infarcts.  MRA and carotid Doppler negative.  EF 55 to 60%.  No DVT.  LDL 108, A1c 5.8.  Discharged on DAPT and Lipitor 40. December 2022 admitted for left MCA scattered small infarcts.  CT no acute abnormality.  CTA head and neck left M2 occlusion.  CTP 0/16 cc.  Status post TNK and IR with TICI2c.  CT post IR showed left small SAH, status post TXA.  Loop recorder no A-fib on interrogation.  LDL 89, A1c 5.7.  Was on aspirin and Brilinta up until February of this year Possible seizure activity during 12/2020 admission with left facial twitching and altered mental status.  EEG showed left spikes.  Loaded with Keppra and continued Keppra on discharge.  Hypertension Home meds: Losartan 25 mg Stable Long-term BP goal normotensive  Hyperlipidemia Home meds: CoQ 10, not resumed in hospital LDL 111, goal < 70 Lovastatin 40 mg Continue statin at discharge  Substance Abuse Patient uses THC UDS positive for THC        Ready to quit? Yes TOC consult for cessation placed  Other Stroke Risk Factors Advanced age  Other acute medical issues Vascular dementia Anxiety Depression  Hospital day # 3   Pt seen by Neuro NP/APP and later by MD. Note/plan to be edited by MD as needed.    Lynnae January, DNP, AGACNP-BC [facility] Please use AMION for contact information & EPIC for messaging.  ATTENDING NOTE: I reviewed above note and agree with the assessment and plan. Pt was seen and examined.   Husband at bedside.  Patient in chair, neuro stable.  Still has right facial droop, right hand weakness with pronator drift, lower  extremity strength symmetrical.  Visual fields full, however has right simultanagnosia.  Sensation symmetrical, right finger-to-nose slow but no significant ataxia.  Patient stroke still likely from left MCA stenosis.  Now on aspirin and Brilinta DAPT as well Lipitor 40.  Patient also enrolled in [place] stroke trial.  Pending CIR.  For detailed assessment and plan, please refer to above/below as I have made changes wherever appropriate.   Marvel Plan, MD PhD Stroke Neurology 03/30/2023 2:38 PM

## 2023-03-31 DIAGNOSIS — E785 Hyperlipidemia, unspecified: Secondary | ICD-10-CM | POA: Diagnosis not present

## 2023-03-31 DIAGNOSIS — I63512 Cerebral infarction due to unspecified occlusion or stenosis of left middle cerebral artery: Secondary | ICD-10-CM | POA: Diagnosis not present

## 2023-03-31 DIAGNOSIS — F1210 Cannabis abuse, uncomplicated: Secondary | ICD-10-CM | POA: Diagnosis not present

## 2023-03-31 DIAGNOSIS — R29704 NIHSS score 4: Secondary | ICD-10-CM | POA: Diagnosis not present

## 2023-03-31 NOTE — Progress Notes (Addendum)
 STROKE TEAM PROGRESS NOTE   3/26: difficulty walking and confusion --> given TNK MRI brain showed small acute left MCA infarct, similar area as her previous stroke  INTERIM HISTORY/SUBJECTIVE  No family at bedside.  Patient sitting up in chair. No acute events overnight, neurological exam and vital signs stable. Pending insurance appeal for CIR discharge.   CBC    Component Value Date/Time   WBC 5.0 03/27/2023 0947   RBC 4.39 03/27/2023 0947   HGB 13.6 03/27/2023 0953   HGB 13.3 06/11/2019 1431   HGB 12.4 08/04/2013 1620   HCT 40.0 03/27/2023 0953   HCT 40.2 06/11/2019 1431   PLT 262 03/27/2023 0947   PLT 160 06/11/2019 1431   MCV 90.9 03/27/2023 0947   MCV 93.7 07/20/2019 0950   MCV 91 06/11/2019 1431   MCH 28.7 03/27/2023 0947   MCHC 31.6 03/27/2023 0947   RDW 14.2 03/27/2023 0947   RDW 12.0 06/11/2019 1431   LYMPHSABS 1.0 03/27/2023 0947   LYMPHSABS 1.3 06/11/2019 1431   MONOABS 0.5 03/27/2023 0947   EOSABS 0.1 03/27/2023 0947   EOSABS 0.1 06/11/2019 1431   BASOSABS 0.1 03/27/2023 0947   BASOSABS 0.1 06/11/2019 1431    BMET    Component Value Date/Time   NA 140 03/27/2023 0953   NA 137 07/20/2019 1058   K 4.2 03/27/2023 0953   CL 103 03/27/2023 0953   CO2 27 03/27/2023 0947   GLUCOSE 104 (H) 03/27/2023 0953   BUN 21 03/27/2023 0953   BUN 20 07/20/2019 1058   CREATININE 0.90 03/27/2023 0953   CREATININE 0.91 07/11/2015 1517   CALCIUM 9.6 03/27/2023 0947   GFRNONAA >60 03/27/2023 0947   GFRNONAA 68 07/11/2015 1517    IMAGING past 24 hours No results found.   Vitals:   03/30/23 2122 03/31/23 0412 03/31/23 1006 03/31/23 1154  BP: 123/67 118/67  129/73  Pulse: 81 84  77  Resp:   10 18  Temp: 99.5 F (37.5 C) 98.3 F (36.8 C)  97.8 F (36.6 C)  TempSrc: Oral Oral  Oral  SpO2: 98% 100%  99%  Weight:      Height:         PHYSICAL EXAM General:  Alert, well-nourished, well-developed pleasant middle-age Caucasian lady no acute distress Psych:   Mood and affect appropriate for situation CV: Regular rate and rhythm on monitor Respiratory:  Regular, unlabored respirations on room air GI: Abdomen soft and nontender   NEURO:  Mental Status: Awake and alert, oriented to self, place, age, month. Speech/Language: No dysarthria or aphasia. Occasional hesitancy of speech.   Cranial Nerves:  II: PERRL. Visual fields right simultanagnosia III, IV, VI: EOMI. Eyelids elevate symmetrically.  V: Sensation is intact to light touch and symmetrical to face.  ZOX:WRUEAV right facial droop  VIII: hearing intact to voice. IX, X: Palate elevates symmetrically. Phonation is normal.  WU:[id number] shrug 5/5. XII: tongue is midline without fasciculations. Motor: 4+/5 strength in right upper extremity with 4//5 grip.   LUE 5/5, BLE 5/5   Sensation- Intact to light touch bilaterally.  Coordination: RUE slow FTN, no overt ataxia.  Gait- deferred  Most Recent NIH 4 Premorbid baseline modified Rankin score 2  ASSESSMENT/PLAN  [name] is a 68 y.o. female with history of hypertension, anxiety and depression, prior left MCA infarct in 2022 admitted for aphasia, difficulty walking and confusion.  NIH on Admission 3  Stroke:  left MCA infarcts s/p TNK, etiology: Likely from left MCA  stenosis, large vessel disease CT head No acute abnormality.  Atrophy. ASPECTS 10.    CTA head & neck distal left M2/proximal M3 stenosis. Moderate mid and severe distal right P2 stenose MRI  small acute left MCA infarct 2D Echo EF 60 to 65% Loop recorder interrogation no A-fib LDL 111 HgbA1c 5.7 UDS positive for THC VTE prophylaxis -Lovenox aspirin 81 mg daily prior to admission, continue aspirin 81 mg daily and Brilinta (ticagrelor) 90 mg bid for 4 weeks and then aspirin alone. (Patient has Plavix allergy). Patient enrolled in [place] stroke research study Therapy recommendations:  CIR Disposition: Pending insurance appeal for CIR approval  Hx of  Stroke/seizure 06/2019 bilateral punctate infarcts, CTA head and neck showed left M2 moderate stenosis.  Loop recorder placed 06/2020 admitted for left-sided weakness.  CT negative.  MRI showed right ACA infarcts.  MRA and carotid Doppler negative.  EF 55 to 60%.  No DVT.  LDL 108, A1c 5.8.  Discharged on DAPT and Lipitor 40. December 2022 admitted for left MCA scattered small infarcts.  CT no acute abnormality.  CTA head and neck left M2 occlusion.  CTP 0/16 cc.  Status post TNK and IR with TICI2c.  CT post IR showed left small SAH, status post TXA.  Loop recorder no A-fib on interrogation.  LDL 89, A1c 5.7.  Was on aspirin and Brilinta up until February of this year Possible seizure activity during 12/2020 admission with left facial twitching and altered mental status.  EEG showed left spikes.  Loaded with Keppra and continued Keppra on discharge.  Hypertension Home meds: Losartan 25 mg,held Stable Long-term BP goal normotensive  Hyperlipidemia Home meds: CoQ 10, not resumed in hospital LDL 111, goal < 70 Lovastatin 40 mg Continue statin at discharge  Substance Abuse Patient uses THC UDS positive for THC        Ready to quit? Yes TOC consult for cessation placed  Other Stroke Risk Factors Advanced age  Other acute medical issues Vascular dementia Anxiety Depression  Hospital day # 4   Pt seen by Neuro NP/APP and later by MD. Note/plan to be edited by MD as needed.    Lynnae January, DNP, AGACNP-BC [facility] Please use AMION for contact information & EPIC for messaging.  ATTENDING NOTE: I reviewed above note and agree with the assessment and plan. Pt was seen and examined.   Patient sitting in chair, no family at bedside.  No acute event overnight, neuro stable, unchanged.  Pending placement.  Continue current management.  For detailed assessment and plan, please refer to above/below as I have made changes wherever appropriate.   Marvel Plan, MD PhD Stroke  Neurology 03/31/2023 1:32 PM

## 2023-03-31 NOTE — Plan of Care (Signed)
  Problem: Education: Goal: Knowledge of disease or condition will improve Outcome: Progressing Goal: Knowledge of secondary prevention will improve (MUST DOCUMENT ALL) Outcome: Progressing Goal: Knowledge of patient specific risk factors will improve (DELETE if not current risk factor) Outcome: Progressing   Problem: Coping: Goal: Will identify appropriate support needs Outcome: Progressing   Problem: Health Behavior/Discharge Planning: Goal: Ability to manage health-related needs will improve Outcome: Progressing   Problem: Self-Care: Goal: Ability to participate in self-care as condition permits will improve Outcome: Progressing   Problem: Clinical Measurements: Goal: Ability to maintain clinical measurements within normal limits will improve Outcome: Progressing Goal: Will remain free from infection Outcome: Progressing

## 2023-03-31 NOTE — Plan of Care (Signed)

## 2023-03-31 NOTE — Plan of Care (Signed)
 Problem: Education: Goal: Knowledge of disease or condition will improve 03/31/2023 0435 by Debbrah Alar, RN Outcome: Progressing 03/31/2023 0431 by Debbrah Alar, RN Outcome: Progressing Goal: Knowledge of secondary prevention will improve (MUST DOCUMENT ALL) 03/31/2023 0435 by Debbrah Alar, RN Outcome: Progressing 03/31/2023 0431 by Debbrah Alar, RN Outcome: Progressing Goal: Knowledge of patient specific risk factors will improve (DELETE if not current risk factor) 03/31/2023 0435 by Debbrah Alar, RN Outcome: Progressing 03/31/2023 0431 by Debbrah Alar, RN Outcome: Progressing   Problem: Ischemic Stroke/TIA Tissue Perfusion: Goal: Complications of ischemic stroke/TIA will be minimized 03/31/2023 0435 by Debbrah Alar, RN Outcome: Progressing 03/31/2023 0431 by Debbrah Alar, RN Outcome: Progressing   Problem: Coping: Goal: Will verbalize positive feelings about self 03/31/2023 0435 by Debbrah Alar, RN Outcome: Progressing 03/31/2023 0431 by Debbrah Alar, RN Outcome: Progressing Goal: Will identify appropriate support needs 03/31/2023 0435 by Debbrah Alar, RN Outcome: Progressing 03/31/2023 0431 by Debbrah Alar, RN Outcome: Progressing   Problem: Health Behavior/Discharge Planning: Goal: Ability to manage health-related needs will improve 03/31/2023 0435 by Debbrah Alar, RN Outcome: Progressing 03/31/2023 0431 by Debbrah Alar, RN Outcome: Progressing Goal: Goals will be collaboratively established with patient/family 03/31/2023 [phone] by Debbrah Alar, RN Outcome: Progressing 03/31/2023 0431 by Debbrah Alar, RN Outcome: Progressing   Problem: Self-Care: Goal: Ability to participate in self-care as condition permits will improve 03/31/2023 0435 by Debbrah Alar, RN Outcome: Progressing 03/31/2023 0431 by Debbrah Alar, RN Outcome: Progressing Goal: Verbalization of feelings and  concerns over difficulty with self-care will improve 03/31/2023 0435 by Debbrah Alar, RN Outcome: Progressing 03/31/2023 0431 by Debbrah Alar, RN Outcome: Progressing Goal: Ability to communicate needs accurately will improve 03/31/2023 0435 by Debbrah Alar, RN Outcome: Progressing 03/31/2023 0431 by Debbrah Alar, RN Outcome: Progressing   Problem: Nutrition: Goal: Risk of aspiration will decrease 03/31/2023 0435 by Debbrah Alar, RN Outcome: Progressing 03/31/2023 0431 by Debbrah Alar, RN Outcome: Progressing Goal: Dietary intake will improve 03/31/2023 0435 by Debbrah Alar, RN Outcome: Progressing 03/31/2023 0431 by Debbrah Alar, RN Outcome: Progressing   Problem: Education: Goal: Knowledge of General Education information will improve Description: Including pain rating scale, medication(s)/side effects and non-pharmacologic comfort measures 03/31/2023 0435 by Debbrah Alar, RN Outcome: Progressing 03/31/2023 0431 by Debbrah Alar, RN Outcome: Progressing   Problem: Health Behavior/Discharge Planning: Goal: Ability to manage health-related needs will improve 03/31/2023 0435 by Debbrah Alar, RN Outcome: Progressing 03/31/2023 0431 by Debbrah Alar, RN Outcome: Progressing   Problem: Clinical Measurements: Goal: Ability to maintain clinical measurements within normal limits will improve 03/31/2023 0435 by Debbrah Alar, RN Outcome: Progressing 03/31/2023 0431 by Debbrah Alar, RN Outcome: Progressing Goal: Will remain free from infection 03/31/2023 0435 by Debbrah Alar, RN Outcome: Progressing 03/31/2023 0431 by Debbrah Alar, RN Outcome: Progressing Goal: Diagnostic test results will improve 03/31/2023 0435 by Debbrah Alar, RN Outcome: Progressing 03/31/2023 0431 by Debbrah Alar, RN Outcome: Progressing Goal: Respiratory complications will improve 03/31/2023 0435 by Debbrah Alar, RN Outcome: Progressing 03/31/2023 0431 by Debbrah Alar, RN Outcome: Progressing Goal: Cardiovascular complication will be avoided 03/31/2023 0435 by Debbrah Alar, RN Outcome: Progressing 03/31/2023 0431 by Debbrah Alar, RN Outcome: Progressing   Problem: Activity: Goal: Risk for activity intolerance will decrease 03/31/2023 0435 by Debbrah Alar, RN Outcome: Progressing 03/31/2023 0431 by  Velia Meyer D, RN Outcome: Progressing   Problem: Nutrition: Goal: Adequate nutrition will be maintained 03/31/2023 0435 by Debbrah Alar, RN Outcome: Progressing 03/31/2023 0431 by Debbrah Alar, RN Outcome: Progressing   Problem: Coping: Goal: Level of anxiety will decrease 03/31/2023 0435 by Debbrah Alar, RN Outcome: Progressing 03/31/2023 0431 by Debbrah Alar, RN Outcome: Progressing   Problem: Elimination: Goal: Will not experience complications related to bowel motility 03/31/2023 0435 by Debbrah Alar, RN Outcome: Progressing 03/31/2023 0431 by Debbrah Alar, RN Outcome: Progressing Goal: Will not experience complications related to urinary retention 03/31/2023 0435 by Debbrah Alar, RN Outcome: Progressing 03/31/2023 0431 by Debbrah Alar, RN Outcome: Progressing   Problem: Pain Managment: Goal: General experience of comfort will improve and/or be controlled 03/31/2023 0435 by Debbrah Alar, RN Outcome: Progressing 03/31/2023 0431 by Debbrah Alar, RN Outcome: Progressing   Problem: Safety: Goal: Ability to remain free from injury will improve 03/31/2023 0435 by Debbrah Alar, RN Outcome: Progressing 03/31/2023 0431 by Debbrah Alar, RN Outcome: Progressing   Problem: Skin Integrity: Goal: Risk for impaired skin integrity will decrease 03/31/2023 0435 by Debbrah Alar, RN Outcome: Progressing 03/31/2023 0431 by Debbrah Alar, RN Outcome: Progressing

## 2023-04-01 ENCOUNTER — Inpatient Hospital Stay (HOSPITAL_COMMUNITY)
Admission: AD | Admit: 2023-04-01 | Discharge: 2023-04-10 | DRG: 057 | Disposition: A | Discharge status: Home Health | Source: Intra-hospital | Attending: Physical Medicine & Rehabilitation | Admitting: Physical Medicine & Rehabilitation

## 2023-04-01 ENCOUNTER — Other Ambulatory Visit: Payer: Self-pay

## 2023-04-01 ENCOUNTER — Encounter (HOSPITAL_COMMUNITY): Payer: Self-pay | Admitting: Physical Medicine & Rehabilitation

## 2023-04-01 DIAGNOSIS — I69351 Hemiplegia and hemiparesis following cerebral infarction affecting right dominant side: Principal | ICD-10-CM

## 2023-04-01 DIAGNOSIS — R296 Repeated falls: Secondary | ICD-10-CM | POA: Diagnosis present

## 2023-04-01 DIAGNOSIS — I10 Essential (primary) hypertension: Secondary | ICD-10-CM | POA: Diagnosis not present

## 2023-04-01 DIAGNOSIS — Z91041 Radiographic dye allergy status: Secondary | ICD-10-CM | POA: Diagnosis not present

## 2023-04-01 DIAGNOSIS — Z820 Family history of epilepsy and other diseases of the nervous system: Secondary | ICD-10-CM | POA: Diagnosis not present

## 2023-04-01 DIAGNOSIS — I69319 Unspecified symptoms and signs involving cognitive functions following cerebral infarction: Secondary | ICD-10-CM | POA: Diagnosis not present

## 2023-04-01 DIAGNOSIS — I69321 Dysphasia following cerebral infarction: Secondary | ICD-10-CM

## 2023-04-01 DIAGNOSIS — R278 Other lack of coordination: Secondary | ICD-10-CM | POA: Diagnosis present

## 2023-04-01 DIAGNOSIS — Z79899 Other long term (current) drug therapy: Secondary | ICD-10-CM | POA: Diagnosis not present

## 2023-04-01 DIAGNOSIS — R29704 NIHSS score 4: Secondary | ICD-10-CM | POA: Diagnosis not present

## 2023-04-01 DIAGNOSIS — Z8489 Family history of other specified conditions: Secondary | ICD-10-CM | POA: Diagnosis not present

## 2023-04-01 DIAGNOSIS — Z881 Allergy status to other antibiotic agents status: Secondary | ICD-10-CM | POA: Diagnosis not present

## 2023-04-01 DIAGNOSIS — M810 Age-related osteoporosis without current pathological fracture: Secondary | ICD-10-CM | POA: Diagnosis not present

## 2023-04-01 DIAGNOSIS — Z808 Family history of malignant neoplasm of other organs or systems: Secondary | ICD-10-CM

## 2023-04-01 DIAGNOSIS — Z8249 Family history of ischemic heart disease and other diseases of the circulatory system: Secondary | ICD-10-CM | POA: Diagnosis not present

## 2023-04-01 DIAGNOSIS — F419 Anxiety disorder, unspecified: Secondary | ICD-10-CM | POA: Diagnosis present

## 2023-04-01 DIAGNOSIS — Z807 Family history of other malignant neoplasms of lymphoid, hematopoietic and related tissues: Secondary | ICD-10-CM

## 2023-04-01 DIAGNOSIS — I63512 Cerebral infarction due to unspecified occlusion or stenosis of left middle cerebral artery: Principal | ICD-10-CM | POA: Diagnosis present

## 2023-04-01 DIAGNOSIS — E785 Hyperlipidemia, unspecified: Secondary | ICD-10-CM | POA: Diagnosis not present

## 2023-04-01 DIAGNOSIS — Z7982 Long term (current) use of aspirin: Secondary | ICD-10-CM | POA: Diagnosis not present

## 2023-04-01 DIAGNOSIS — I69314 Frontal lobe and executive function deficit following cerebral infarction: Secondary | ICD-10-CM | POA: Diagnosis not present

## 2023-04-01 DIAGNOSIS — Z888 Allergy status to other drugs, medicaments and biological substances status: Secondary | ICD-10-CM | POA: Diagnosis not present

## 2023-04-01 DIAGNOSIS — K5900 Constipation, unspecified: Secondary | ICD-10-CM | POA: Diagnosis not present

## 2023-04-01 DIAGNOSIS — Z91013 Allergy to seafood: Secondary | ICD-10-CM

## 2023-04-01 DIAGNOSIS — I69993 Ataxia following unspecified cerebrovascular disease: Secondary | ICD-10-CM | POA: Diagnosis not present

## 2023-04-01 DIAGNOSIS — I63312 Cerebral infarction due to thrombosis of left middle cerebral artery: Secondary | ICD-10-CM

## 2023-04-01 DIAGNOSIS — F32A Depression, unspecified: Secondary | ICD-10-CM | POA: Diagnosis not present

## 2023-04-01 HISTORY — DX: Cerebral infarction due to unspecified occlusion or stenosis of left middle cerebral artery: I63.512

## 2023-04-01 MED ORDER — ASPIRIN 81 MG PO CHEW: 81.0000 mg | CHEWABLE_TABLET | Freq: Every day | ORAL | Status: AC

## 2023-04-01 MED ORDER — ACETAMINOPHEN 325 MG PO TABS: 650.0000 mg | ORAL_TABLET | ORAL | Status: DC | PRN

## 2023-04-01 MED ORDER — STUDY - LIBREXIA-STROKE - MILVEXIAN 25 MG OR PLACEBO TABLET (PI-SETHI): 1.0000 | ORAL_TABLET | Freq: Two times a day (BID) | ORAL | Status: DC

## 2023-04-01 MED ORDER — TICAGRELOR 90 MG PO TABS
90.0000 mg | ORAL_TABLET | Freq: Two times a day (BID) | ORAL | Status: DC
  Administered 2023-04-01 – 2023-04-10 (×18): 90 mg via ORAL
  Filled 2023-04-01 (×18): qty 1

## 2023-04-01 MED ORDER — SENNOSIDES-DOCUSATE SODIUM 8.6-50 MG PO TABS: 1.0000 | ORAL_TABLET | Freq: Every evening | ORAL | Status: DC | PRN

## 2023-04-01 MED ORDER — TICAGRELOR 90 MG PO TABS: 90.0000 mg | ORAL_TABLET | Freq: Two times a day (BID) | ORAL | Status: DC

## 2023-04-01 MED ORDER — ATORVASTATIN CALCIUM 40 MG PO TABS
40.0000 mg | ORAL_TABLET | Freq: Every day | ORAL | Status: DC
  Administered 2023-04-02 – 2023-04-10 (×9): 40 mg via ORAL
  Filled 2023-04-01 (×9): qty 1

## 2023-04-01 MED ORDER — ATORVASTATIN CALCIUM 40 MG PO TABS: 40.0000 mg | ORAL_TABLET | Freq: Every day | ORAL | Status: DC

## 2023-04-01 MED ORDER — ACETAMINOPHEN 650 MG RE SUPP: 650.0000 mg | RECTAL | Status: DC | PRN

## 2023-04-01 MED ORDER — ASPIRIN 81 MG PO CHEW
81.0000 mg | CHEWABLE_TABLET | Freq: Every day | ORAL | Status: DC
  Administered 2023-04-02 – 2023-04-10 (×9): 81 mg via ORAL
  Filled 2023-04-01 (×9): qty 1

## 2023-04-01 MED ORDER — STUDY - LIBREXIA-STROKE - MILVEXIAN 25 MG OR PLACEBO TABLET (PI-SETHI)
1.0000 | ORAL_TABLET | Freq: Two times a day (BID) | ORAL | Status: DC
  Administered 2023-04-01 – 2023-04-10 (×18): 1 via ORAL
  Filled 2023-04-01 (×18): qty 1

## 2023-04-01 MED ORDER — ACETAMINOPHEN 160 MG/5ML PO SOLN: 650.0000 mg | ORAL | Status: DC | PRN

## 2023-04-01 NOTE — Progress Notes (Signed)
 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  She did have a loop recorder placed at that time and maintained on aspirin.  She was taken off of Brilinta in February 2025.  Patient received received inpatient rehab services 12/08/2020 - 12/17/2020 and was discharged to home contact-guard for ambulation.  Per chart review patient lives with spouse.  1 level home 5 steps to entry.  Presented 03/27/2023 with aphasia, altered mental status and unstable gait with frequent falls.  CT/MRI showed small acute left MCA infarction.  Severe chronic small vessel ischemic disease and cerebral atrophy.  CTA showed no large vessel occlusion.She did receive TNK.  Admission chemistries unremarkable, urine drug screen positive marijuana.  Echocardiogram ejection fraction of 60 to 65% no wall motion abnormalities grade 1 diastolic dysfunction.  Neurology follow-up currently maintained on aspirin as well as Brilinta 90 mg twice daily with study drug followed by neurology services.  Tolerating a regular consistency diet.  Therapy evaluations completed and due to patient's decreased functional mobility was admitted for a comprehensive rehab program.   Review of Systems  Constitutional:  Negative for chills and fever.  HENT:  Negative for hearing loss.   Eyes:  Negative for blurred vision and double vision.  Respiratory:  Negative for cough, shortness of breath and wheezing.   Cardiovascular:  Negative for chest pain and palpitations.  Gastrointestinal:  Positive for constipation. Negative for heartburn, nausea and vomiting.  Genitourinary:  Negative for dysuria, flank  pain and hematuria.  Musculoskeletal:  Positive for falls.  Skin:  Negative for rash.  Neurological:  Positive for speech change and weakness.  Psychiatric/Behavioral:  Positive for depression.        Anxiety  All other systems reviewed and are negative.             Past Medical History:  Diagnosis Date   Anemia     Anxiety     Depression     Dysmenorrhea     Endometriosis     Fibroid     Hypertension     Implantable loop recorder present 2021   Microhematuria      negative workup   Osteoporosis     Tachycardia     Thrombocytopenia (HCC)                        Past Surgical History:  Procedure Laterality Date   BREAST BIOPSY       BUBBLE STUDY   12/08/2020    Procedure: BUBBLE STUDY;  Surgeon: Wendall Stade, MD;  Location: [facility] ENDOSCOPY;  Service: Cardiovascular;;   CESAREAN SECTION       hysteroscopic resection       implantable loop recorder implant   10/21/2019    Medtronic Reveal Linq model LNQ 22 ([place] [id number]) implantable loop recorder   IR CT HEAD LTD   12/01/2020   IR PERCUTANEOUS ART THROMBECTOMY/INFUSION INTRACRANIAL INC DIAG ANGIO   12/01/2020   IR US GUIDE VASC ACCESS RIGHT   12/01/2020   ORIF HUMERUS FRACTURE Left 07/31/2021    Procedure: OPEN REDUCTION INTERNAL FIXATION (ORIF) DISTAL HUMERUS FRACTURE;  Surgeon: Roby Lofts, MD;  Location: [facility] OR;  Service: Orthopedics;  Laterality: Left;   RADIOLOGY WITH ANESTHESIA N/A 12/01/2020    Procedure: IR WITH ANESTHESIA - CODE STROKE;  Surgeon: Radiologist, Medication, MD;  Location: [facility] OR;  Service: Radiology;  Laterality: N/A;   TEE WITHOUT CARDIOVERSION N/A 12/08/2020    Procedure: TRANSESOPHAGEAL ECHOCARDIOGRAM (TEE);  Surgeon: Wendall Stade, MD;  Location: [facility] ENDOSCOPY;  Service: Cardiovascular;  Laterality: N/A;                      Family History  Problem Relation Age of Onset   Cancer Father          non hodgkin lymphoma & skin   Heart attack Maternal Grandfather     Dementia Mother      Polymyositis Sister            Social History:  reports that she has never smoked. She has never used smokeless tobacco. She reports that she does not drink alcohol and does not use drugs. Allergies:  Allergies           Allergies  Allergen Reactions   Avelox [Moxifloxacin] Hives   Ciprofloxacin Hives   Floraquin [Iodoquinol]        No cipro or others   Iohexol         Code: HIVES, Desc: HIVES S/P IVP MANY YRS AGO     Levaquin [Levofloxacin] Hives   Plavix [Clopidogrel] Diarrhea   Quinolones Hives   Shellfish Allergy Itching      Itching under the chin. Described by patient but not seen by husband.                    Medications Prior to Admission  Medication Sig Dispense Refill   Amino Acids Complex TABS Take 1 tablet by mouth daily.       aspirin EC 81 MG tablet Take 81 mg by mouth daily. Swallow whole.       CINNAMON PO Take 1 capsule by mouth daily.       Coenzyme Q10 (CO Q 10 PO) Take 1 capsule by mouth daily.       losartan (COZAAR) 25 MG tablet TAKE 1/2 TABLET BY MOUTH DAILY 90 tablet 1   MAGNESIUM PO Take 1 tablet by mouth at bedtime.       Multiple Vitamin (MULTIVITAMIN WITH MINERALS) TABS tablet Take 1 tablet by mouth daily.       Multiple Vitamins-Minerals (ZINC PO) Take 1 tablet by mouth daily.                    Home: Home Living Family/patient expects to be discharged to:: Private residence Living Arrangements: Spouse/significant other Available Help at Discharge: Family, Available 24 hours/day (spouse providing 24/7 supervision for the past 4 years) Type of Home: House Home Access: Stairs to enter Secretary/administrator of Steps: 5 Entrance Stairs-Rails: Right Home Layout: One level Bathroom Shower/Tub: Health visitor: Handicapped height Bathroom Accessibility: Yes Home Equipment:  (pt unable to provide) Additional Comments: Unsure of accuracy of home set up  Lives With: Spouse   Functional History: Prior Function Prior Level  of Function : Patient poor historian/Family not available Mobility Comments: Pt reports she enjoys walking at baseline ADLs Comments: spouse provides supervision for all adls per spouse in the past 4 years   Functional Status:  Mobility: Bed Mobility Overal bed mobility: Needs Assistance Bed Mobility: Supine to Sit Supine to sit: Supervision General bed  mobility comments: up in recliner on arrival and departure. Transfers Overall transfer level: Needs assistance Equipment used: None Transfers: Sit to/from Stand Sit to Stand: Contact guard assist Bed to/from chair/wheelchair/BSC transfer type:: Step pivot Step pivot transfers: Min assist, Contact guard assist General transfer comment: Steadying assist to rise to stand from recliner Ambulation/Gait Ambulation/Gait assistance: Min assist, Contact guard assist Gait Distance (Feet): 300 Feet Assistive device: 1 person hand held assist Gait Pattern/deviations: Step-through pattern, Decreased stride length, Decreased dorsiflexion - right, Decreased dorsiflexion - left, Shuffle, Narrow base of support General Gait Details: Decreased bilateral foot clearance and heel strike at initial contact. Consistent minA for balance and cues for environmental navigation. Decreased gait speed. Gait velocity: decreased Gait velocity interpretation: 1.31 - 2.62 ft/sec, indicative of limited community ambulator Stairs: Yes Stairs assistance: Contact guard assist Stair Management: One rail Right, Forwards, Backwards Number of Stairs: 1 General stair comments: HHA   ADL: ADL Overall ADL's : Needs assistance/impaired Eating/Feeding: Minimal assistance, Sitting Grooming: Minimal assistance, Standing Upper Body Bathing: Minimal assistance, Sitting Lower Body Bathing: Moderate assistance, Sit to/from stand Upper Body Dressing : Moderate assistance, Sitting Lower Body Dressing: Moderate assistance, Sit to/from stand Toilet Transfer: Minimal assistance,  Regular Toilet, Ambulation   Cognition: Cognition Overall Cognitive Status: History of cognitive impairments - at baseline Arousal/Alertness: Awake/alert Orientation Level: Oriented X4 Attention: Sustained Sustained Attention: Impaired Sustained Attention Impairment: Verbal basic Memory: Impaired Memory Impairment: Decreased recall of new information, Retrieval deficit Awareness: Impaired Awareness Impairment: Intellectual impairment Problem Solving: Impaired Problem Solving Impairment: Functional complex Executive Function: Sequencing, Organizing Sequencing: Impaired Sequencing Impairment: Functional basic Organizing: Impaired Organizing Impairment: Functional basic Safety/Judgment: Appears intact Cognition Arousal: Alert Behavior During Therapy: WFL for tasks assessed/performed Overall Cognitive Status: History of cognitive impairments - at baseline   Physical Exam: Blood pressure (!) 142/75, pulse 84, temperature 98.2 F (36.8 C), temperature source Oral, resp. rate 18, height 5\' 4"  (1.626 m), weight 51.9 kg, last menstrual period 01/02/2003, SpO2 100%. Physical Exam Neurological:     Comments: Patient is alert.  Up ambulating with assistance to the bathroom.  Makes eye contact with examiner.  Follows simple commands.  She provides her name and did have some delay in processing for providing age.  Limited medical historian.  Needed some cues for safety.      General: No acute distress Mood and affect are appropriate Heart: Regular rate and rhythm no rubs murmurs or extra sounds Lungs: Clear to auscultation, breathing unlabored, no rales or wheezes Abdomen: Positive bowel sounds, soft nontender to palpation, nondistended Extremities: No clubbing, cyanosis, or edema Skin: No evidence of breakdown, no evidence of rash Neurologic: Cranial nerves II through XII intact, motor strength is 5/5 left and 4/5 right deltoid, bicep, tricep, grip, 5/5 bilateral hip flexor, knee  extensors, ankle dorsiflexor and plantar flexor Sensory exam normal sensation to light touch  in bilateral upper and lower extremities Cerebellar exam normal finger to nose to finger as well as heel to shin in bilateral upper and lower extremities Finger to thumb opposition slower on the right upper extremity than in the left upper extremity No dysarthria, mildly pressured speech Musculoskeletal: Full range of motion in all 4 extremities. No joint swelling    Lab Results Last 48 Hours  No results found for this or any previous visit (from the past 48 hours).    Imaging Results (Last 48 hours)  No results found.          Blood pressure (!) 142/75, pulse 84,  temperature 98.2 F (36.8 C), temperature source Oral, resp. rate 18, height 5\' 4"  (1.626 m), weight 51.9 kg, last menstrual period 01/02/2003, SpO2 100%.   Medical Problem List and Plan: 1. Functional deficits secondary to left MCA infarction status post TNK as well as history of left MCA infarction 2022 and received CIR             -patient may shower             -ELOS/Goals: 7 to 10 days supervision goals 2.  Antithrombotics: -DVT/anticoagulation:  Mechanical: Antiembolism stockings, thigh (TED hose) Bilateral lower extremities             -antiplatelet therapy: Aspirin 81 mg daily and Brilinta 90 mg twice daily x 4 weeks then aspirin alone.  Patient enrolled in [place] stroke research study 3. Pain Management: Tylenol as needed 4. Mood/Behavior/Sleep: Provide emotional support             -antipsychotic agents: N/A 5. Neuropsych/cognition: This patient is not capable of making decisions on her own behalf. 6. Skin/Wound Care: Routine skin checks 7. Fluids/Electrolytes/Nutrition: Routine in and outs with follow-up chemistries 8.  Hyperlipidemia.  Lipitor .9.  History of thrombocytopenia.  Latest platelet count 262,000 10.  Urine drug screen positive marijuana.  Counseling       Charlton Amor, PA-C 04/01/2023    "I  have personally performed a face to face diagnostic evaluation of this patient.  Additionally, I have reviewed and concur with the physician assistant's documentation above."  [name] M.D. [facility] Fellow Am Acad of Phys Med and Rehab Diplomate Am Board of Electrodiagnostic Med Fellow Am Board of Interventional Pain

## 2023-04-01 NOTE — Progress Notes (Signed)
 Inpatient Rehab Admissions Coordinator:   We have received approval on appeal for CIR admission. We have a bed available today if patient is medically stable to discharge. Will continue to follow for likely admission to inpatient rehab today.   Rehab Admissons Coordinator [place], [place], [place] [phone]

## 2023-04-01 NOTE — Progress Notes (Signed)
 Occupational Therapy Treatment Patient Details Name: Jacqueline Bonilla MRN: [id number] DOB: 09-11-1955 Today's Date: 04/01/2023   History of present illness Pt is a 68 y.o. F who presents 03/27/2023 with expressive aphasia. MRI showing evolving infarct in left hemisphere and she was given TNK. CTA head and neck shows no LVO. Significant PMH: L MCA infarct December 2022, depression, HTN, osteoporosis.   OT comments  Patent continues to struggle with R upper extremity dexterity.  Continue to encourage incorporating R hand/arm into tasks.  Patient tried with and without RW, seems to walk better without the RW.  Patient bumping into objects in her L visual field this session.  OT to continue efforts in the acute setting to address deficits, and Patient will benefit from intensive inpatient follow-up therapy, >3 hours/day.      If plan is discharge home, recommend the following:  A lot of help with bathing/dressing/bathroom;A little help with walking and/or transfers;Assistance with cooking/housework;Supervision due to cognitive status;Assist for transportation;Help with stairs or ramp for entrance   Equipment Recommendations  Tub/shower seat    Recommendations for Other Services      Precautions / Restrictions Precautions Precautions: Fall Restrictions Weight Bearing Restrictions Per Provider Order: No       Mobility Bed Mobility Overal bed mobility: Needs Assistance Bed Mobility: Supine to Sit     Supine to sit: Supervision          Transfers Overall transfer level: Needs assistance Equipment used: None Transfers: Sit to/from Stand Sit to Stand: Contact guard assist     Step pivot transfers: Min assist, Contact guard assist           Balance Overall balance assessment: Needs assistance Sitting-balance support: Feet supported Sitting balance-Leahy Scale: Good     Standing balance support: Single extremity supported, No upper extremity supported, Reliant on  assistive device for balance Standing balance-Leahy Scale: Poor                             ADL either performed or assessed with clinical judgement   ADL Overall ADL's : Needs assistance/impaired Eating/Feeding: Minimal assistance;Sitting   Grooming: Minimal assistance;Standing   Upper Body Bathing: Minimal assistance;Sitting   Lower Body Bathing: Moderate assistance;Sit to/from stand   Upper Body Dressing : Moderate assistance;Sitting   Lower Body Dressing: Moderate assistance;Sit to/from stand   Toilet Transfer: Minimal Holiday representative;Ambulation                  Extremity/Trunk Assessment Upper Extremity Assessment RUE Deficits / Details: Grossly 4/5 MMT, exhibits flexion posture, but can break out of it, probably more inattention RUE Sensation: WNL RUE Coordination: decreased fine motor;decreased gross motor   Lower Extremity Assessment Lower Extremity Assessment: Defer to PT evaluation   Cervical / Trunk Assessment Cervical / Trunk Assessment: Normal    Vision Baseline Vision/History: 1 Wears glasses Vision Assessment?: Vision impaired- to be further tested in functional context   Perception Perception Perception: Impaired Preception Impairment Details: Inattention/Neglect Perception-Other Comments: this session bumping into objects on the L side   Praxis     Communication Communication Communication: Impaired Factors Affecting Communication: Reduced clarity of speech   Cognition Arousal: Alert Behavior During Therapy: WFL for tasks assessed/performed Cognition: Cognition impaired         Attention impairment (select first level of impairment): Sustained attention Executive functioning impairment (select all impairments): Reasoning, Problem solving, Sequencing  Following commands: Intact Following commands impaired: Follows multi-step commands with increased time      Cueing   Cueing Techniques:  Verbal cues  Exercises      Shoulder Instructions       General Comments      Pertinent Vitals/ Pain       Pain Assessment Pain Assessment: No/denies pain Pain Intervention(s): Monitored during session                                                          Frequency  Min 2X/week        Progress Toward Goals  OT Goals(current goals can now be found in the care plan section)  Progress towards OT goals: Progressing toward goals  Acute Rehab OT Goals OT Goal Formulation: With patient Time For Goal Achievement: 04/11/23 Potential to Achieve Goals: Good  Plan      Co-evaluation                 AM-PAC OT "6 Clicks" Daily Activity     Outcome Measure   Help from another person eating meals?: A Little Help from another person taking care of personal grooming?: A Little Help from another person toileting, which includes using toliet, bedpan, or urinal?: A Lot Help from another person bathing (including washing, rinsing, drying)?: A Lot Help from another person to put on and taking off regular upper body clothing?: A Lot Help from another person to put on and taking off regular lower body clothing?: A Lot 6 Click Score: 14    End of Session Equipment Utilized During Treatment: Gait belt  OT Visit Diagnosis: Unsteadiness on feet (R26.81);Muscle weakness (generalized) (M62.81);Apraxia (R48.2);Hemiplegia and hemiparesis Hemiplegia - Right/Left: Right Hemiplegia - dominant/non-dominant: Dominant Hemiplegia - caused by: Cerebral infarction   Activity Tolerance Patient tolerated treatment well   Patient Left in chair;with call bell/phone within reach;with chair alarm set   Nurse Communication Mobility status        Time: 1610-9604 OT Time Calculation (min): 26 min  Charges: OT General Charges $OT Visit: 1 Visit OT Treatments $Self Care/Home Management : 23-37 mins  04/01/2023  RP, OTR/L  Acute Rehabilitation  Services  Office:  [phone]    Suzanna Obey 04/01/2023, 8:34 AM

## 2023-04-01 NOTE — TOC Transition Note (Signed)
 Transition of Care [facility]) - Discharge Note   Patient Details  Name: Jacqueline Bonilla MRN: [id number] Date of Birth: 1955/04/16  [facility] CM/SW Contact:  Kermit Balo, RN Phone Number: 04/01/2023, 11:26 AM   Clinical Narrative:     Pt is discharging to CIR today. CM signing off.   Final next level of care: IP Rehab Facility Barriers to Discharge: No Barriers Identified   Patient Goals and CMS Choice   CMS Medicare.gov Compare Post Acute Care list provided to:: Patient Choice offered to / list presented to : Patient [place] ownership interest in [facility].provided to:: Spouse    Discharge Placement                       Discharge Plan and Services Additional resources added to the After Visit Summary for       Post Acute Care Choice: IP Rehab                               Social Drivers of Health (SDOH) Interventions SDOH Screenings   Food Insecurity: No Food Insecurity (03/27/2023)  Housing: Low Risk  (03/27/2023)  Transportation Needs: No Transportation Needs (03/27/2023)  Utilities: Not At Risk (03/27/2023)  Alcohol Screen: Low Risk  (10/23/2022)  Depression (PHQ2-9): Low Risk  (02/11/2023)  Financial Resource Strain: Low Risk  (10/23/2022)  Physical Activity: Sufficiently Active (10/23/2022)  Social Connections: Moderately Isolated (03/27/2023)  Stress: No Stress Concern Present (08/28/2021)  Tobacco Use: Low Risk  (03/27/2023)  Health Literacy: Inadequate Health Literacy (10/23/2022)     Readmission Risk Interventions     No data to display

## 2023-04-01 NOTE — Progress Notes (Signed)
 Physical Therapy Treatment Patient Details Name: Jacqueline Bonilla MRN: [id number] DOB: [birth year] Today's Date: 04/01/2023   History of Present Illness Pt is a 68 y.o. F who presents 03/27/2023 with expressive aphasia. MRI showing evolving infarct in left hemisphere and she was given TNK. CTA head and neck shows no LVO. Significant PMH: L MCA infarct December 2022, depression, HTN, osteoporosis.    PT Comments  Pt is working towards goals. Focus during today's session was balance with functional gait patterns including increasing/decreasing velocity and turns. Pt continues to require Min A intermittently during gait. Pt is CGA to Min A for sit to stand and gait. Due to pt current functional status, home set up and available assistance at home recommending skilled physical therapy services > 3 hours/day in order to address strength, balance and functional mobility to decrease risk for falls, injury, immobility, skin break down and re-hospitalization.      If plan is discharge home, recommend the following: A little help with walking and/or transfers;Assistance with cooking/housework;Assist for transportation;Help with stairs or ramp for entrance     Equipment Recommendations  Rolling walker (2 wheels);BSC/3in1       Precautions / Restrictions Precautions Precautions: Fall Recall of Precautions/Restrictions: Intact Restrictions Weight Bearing Restrictions Per Provider Order: No     Mobility  Bed Mobility     General bed mobility comments: up in recliner on arrival and departure.    Transfers Overall transfer level: Needs assistance Equipment used: None Transfers: Sit to/from Stand Sit to Stand: Contact guard assist           General transfer comment: Steadying assist to rise to stand from recliner    Ambulation/Gait Ambulation/Gait assistance: Min assist, Contact guard assist Gait Distance (Feet): 400 Feet Assistive device: 1 person hand held assist Gait  Pattern/deviations: Step-through pattern, Decreased stride length, Decreased dorsiflexion - right, Decreased dorsiflexion - left, Shuffle, Narrow base of support Gait velocity: decreased Gait velocity interpretation: 1.31 - 2.62 ft/sec, indicative of limited community ambulator   General Gait Details: Decreased bilateral foot clearance and heel strike at initial contact. Intermittent minA for balance and cues for environmental navigation. Decreased gait speed.    Modified Rankin (Stroke Patients Only) Modified Rankin (Stroke Patients Only) Pre-Morbid Rankin Score: Slight disability Modified Rankin: Moderately severe disability     Balance Overall balance assessment: Needs assistance Sitting-balance support: Feet supported Sitting balance-Leahy Scale: Good     Standing balance support: Single extremity supported, No upper extremity supported, Reliant on assistive device for balance Standing balance-Leahy Scale: Poor Standing balance comment: External support by therapist. Pt has difficulty with head movements dueing gait, quick turns and regular turns with multiple steps in varying directions when trying to turn. Pt requires Min A during turns and with cues for velocity changes.        Communication Communication Communication: Impaired Factors Affecting Communication: Reduced clarity of speech  Cognition   Behavior During Therapy: WFL for tasks assessed/performed   PT - Cognitive impairments: History of cognitive impairments, Awareness, Problem solving       Following commands: Intact Following commands impaired: Follows multi-step commands with increased time    Cueing Cueing Techniques: Verbal cues     General Comments General comments (skin integrity, edema, etc.): No noted skin issues. No signs/symptoms of cardiac/respirotry distress with activity      Pertinent Vitals/Pain Pain Assessment Pain Assessment: No/denies pain     PT Goals (current goals can now be  found in the care plan section) Acute  Rehab PT Goals Patient Stated Goal: to improve PT Goal Formulation: With patient Time For Goal Achievement: 04/11/23 Potential to Achieve Goals: Good Progress towards PT goals: Progressing toward goals    Frequency    Min 3X/week      PT Plan  Continue with current POC        AM-PAC PT "6 Clicks" Mobility   Outcome Measure  Help needed turning from your back to your side while in a flat bed without using bedrails?: A Little Help needed moving from lying on your back to sitting on the side of a flat bed without using bedrails?: A Little Help needed moving to and from a bed to a chair (including a wheelchair)?: A Little Help needed standing up from a chair using your arms (e.g., wheelchair or bedside chair)?: A Little Help needed to walk in hospital room?: A Little   6 Click Score: 15    End of Session Equipment Utilized During Treatment: Gait belt   Patient left: in chair;with call bell/phone within reach;with chair alarm set Nurse Communication: Mobility status PT Visit Diagnosis: Unsteadiness on feet (R26.81);Other abnormalities of gait and mobility (R26.89)     Time: 1914-7829 PT Time Calculation (min) (ACUTE ONLY): 14 min  Charges:    $Therapeutic Activity: 8-22 mins PT General Charges $$ ACUTE PT VISIT: 1 Visit                     Harrel Carina, DPT, CLT  Acute Rehabilitation Services Office: [phone] (Secure chat preferred)    Claudia Desanctis 04/01/2023, 11:18 AM

## 2023-04-01 NOTE — Progress Notes (Signed)
 Patient wheeled off unit to CIR IV removed. CCMD notified of D/C

## 2023-04-01 NOTE — Discharge Instructions (Addendum)
 Inpatient Rehab Discharge Instructions  Jacqueline Bonilla Discharge date and time: No discharge date for patient encounter.   Activities/Precautions/ Functional Status: Activity: As tolerated Diet: Regular Wound Care: Routine skin checks Functional status:  ___ No restrictions     ___ Walk up steps independently ___ 24/7 supervision/assistance   ___ Walk up steps with assistance ___ Intermittent supervision/assistance  ___ Bathe/dress independently ___ Walk with walker     _x__ Bathe/dress with assistance ___ Walk Independently    ___ Shower independently ___ Walk with assistance    ___ Shower with assistance ___ No alcohol     ___ Return to work/school ________  Special Instructions: No driving smoking or alcohol   COMMUNITY REFERRALS UPON DISCHARGE:    Home Health:   PT      OT      ST                 Agency: [facility] Phone: [phone] *Please expect follow-up within 2-3 business days for discharge to schedule your home visit. If you have not received follow-up, be sure to contact the site directly.*   My questions have been answered and I understand these instructions. I will adhere to these goals and the provided educational materials after my discharge from the hospital.  Patient/Caregiver Signature _______________________________ Date __________  Clinician Signature _______________________________________ Date __________  Please bring this form and your medication list with you to all your follow-up doctor's appointments. STROKE/TIA DISCHARGE INSTRUCTIONS SMOKING Cigarette smoking nearly doubles your risk of having a stroke & is the single most alterable risk factor  If you smoke or have smoked in the last 12 months, you are advised to quit smoking for your health. Most of the excess cardiovascular risk related to smoking disappears within a year of stopping. Ask you doctor about anti-smoking medications  Quit Line: 1-800-QUIT NOW Free Smoking Cessation  Classes [phone]  CHOLESTEROL Know your levels; limit fat & cholesterol in your diet  Lipid Panel     Component Value Date/Time   CHOL 173 03/28/2023 0558   CHOL 193 09/08/2020 0813   TRIG 101 03/28/2023 0558   HDL 42 03/28/2023 0558   HDL 52 09/08/2020 0813   CHOLHDL 4.1 03/28/2023 0558   VLDL 20 03/28/2023 0558   LDLCALC 111 (H) 03/28/2023 0558   LDLCALC 127 (H) 09/08/2020 0813     Many patients benefit from treatment even if their cholesterol is at goal. Goal: Total Cholesterol (CHOL) less than 160 Goal:  Triglycerides (TRIG) less than 150 Goal:  HDL greater than 40 Goal:  LDL (LDLCALC) less than 100   BLOOD PRESSURE American Stroke Association blood pressure target is less that 120/80 mm/Hg  Your discharge blood pressure is:  BP: 120/69 Monitor your blood pressure Limit your salt and alcohol intake Many individuals will require more than one medication for high blood pressure  DIABETES (A1c is a blood sugar average for last 3 months) Goal HGBA1c is under 7% (HBGA1c is blood sugar average for last 3 months)  Diabetes: No known diagnosis of diabetes    Lab Results  Component Value Date   HGBA1C 5.7 (H) 02/19/2023    Your HGBA1c can be lowered with medications, healthy diet, and exercise. Check your blood sugar as directed by your physician Call your physician if you experience unexplained or low blood sugars.  PHYSICAL ACTIVITY/REHABILITATION Goal is 30 minutes at least 4 days per week  Activity: Increase activity slowly, Therapies: Physical Therapy: Home Health Return  to work:  Activity decreases your risk of heart attack and stroke and makes your heart stronger.  It helps control your weight and blood pressure; helps you relax and can improve your mood. Participate in a regular exercise program. Talk with your doctor about the best form of exercise for you (dancing, walking, swimming, cycling).  DIET/WEIGHT Goal is to maintain a healthy weight  Your discharge diet  is:  Diet Order             Diet Heart Room service appropriate? Yes with Assist; Fluid consistency: Thin  Diet effective now                   liquids Your height is:  Height: 5\' 4"  (162.6 cm) Your current weight is: Weight: 51.7 kg Your Body Mass Index (BMI) is:  BMI (Calculated): 19.55 Following the type of diet specifically designed for you will help prevent another stroke. Your goal weight range is:   Your goal Body Mass Index (BMI) is 19-24. Healthy food habits can help reduce 3 risk factors for stroke:  High cholesterol, hypertension, and excess weight.  RESOURCES Stroke/Support Group:  Call [phone]   STROKE EDUCATION PROVIDED/REVIEWED AND GIVEN TO PATIENT Stroke warning signs and symptoms How to activate emergency medical system (call 911). Medications prescribed at discharge. Need for follow-up after discharge. Personal risk factors for stroke. Pneumonia vaccine given: No Flu vaccine given: No My questions have been answered, the writing is legible, and I understand these instructions.  I will adhere to these goals & educational materials that have been provided to me after my discharge from the hospital.

## 2023-04-01 NOTE — Progress Notes (Signed)
 Inpatient Rehabilitation Admission Medication Review by a Pharmacist  A complete drug regimen review was completed for this patient to identify any potential clinically significant medication issues.  High Risk Drug Classes Is patient taking? Indication by Medication  Antipsychotic No   Anticoagulant No   Antibiotic No   Opioid No   Antiplatelet Yes Aspirin, Brilinta - CVA  Hypoglycemics/insulin No   Vasoactive Medication No   Chemotherapy No   Other Yes Atorvastatin - HLD Study drug - CVA     Type of Medication Issue Identified Description of Issue Recommendation(s)  Drug Interaction(s) (clinically significant)     Duplicate Therapy     Allergy     No Medication Administration End Date     Incorrect Dose     Additional Drug Therapy Needed     Significant med changes from prior encounter (inform family/care partners about these prior to discharge). Losartan 12.5 mg po daily Resume if and when appropriate during or after rehab stay.  Other       Clinically significant medication issues were identified that warrant physician communication and completion of prescribed/recommended actions by midnight of the next day:  No  Name of provider notified for urgent issues identified:   Provider Method of Notification:     Pharmacist comments:   Time spent performing this drug regimen review (minutes):  20 minutes  Thank you Okey Regal, PharmD

## 2023-04-01 NOTE — Progress Notes (Signed)
 Physical Medicine and Rehabilitation Consult Reason for Consult: Evaluate appropriateness for Inpatient Rehab Referring Physician: Dr. Iver Nestle       HPI: RUSTI ARIZMENDI is a 68 y.o. female with PMHx of  has a past medical history of Anemia, Anxiety, Depression, Dysmenorrhea, Endometriosis, Fibroid, Hypertension, Implantable loop recorder present (2021), Microhematuria, Osteoporosis, Tachycardia, and Thrombocytopenia (HCC). . They were admitted to [facility] on 03/27/2023 for increased confusion, balance deficits, and difficulty speaking.  Of note, she has a history of prior stroke left MCA status post thrombectomy in 2022, and has been taken off of Brilinta in February 2025.  CT head negative on intake, MRI showed evolving infarct in the left hemisphere and the patient was given TNK and admitted by neurology for acute CVA.   Per chart review, patient lives with her spouse in a house with a single level and 5 steps to enter.  She is independent of mobility and ADLs at baseline.  Currently, she is max assist for lower body dressing, supervision for bed mobility, contact-guard assist for transfers, and can walk min assist 15 feet with a single person hand-held assist.  SLP performed an evaluation and found difficulties with sustained attention, awareness, short-term memory, problem-solving and executive function as well as some mild residual aphasia; unsure how much is baseline given prior cognitive deficits, her husband feels she is about at her baseline with memory deficits.  Per review of neuropsych evaluation 05/06/2020, patient was fully dependent on husband for complex tasks such as driving and finances, most instrumental test such as cooking, chores, and grocery shopping; patient's husband confirms that this is still the case.  OT eval pending.  She has been to inpatient rehab for prior strokes July 2022 and December 2022, follows with Dr. Wynn Banker.  Since then, she has had progressive  frequency of falls at home, as well as decreased confidence in ambulation such that she is increasingly dependent on her husband.  This escalated significantly about 6 weeks ago, when they had to come home from a family trip early because she had 4 falls within a few days at the daughter's home.         Review of Systems  Constitutional:  Negative for chills and fever.  HENT:  Negative for hearing loss and nosebleeds.   Eyes:  Negative for blurred vision and double vision.  Respiratory:  Negative for cough and wheezing.   Cardiovascular:  Negative for chest pain and palpitations.  Gastrointestinal:  Negative for nausea and vomiting.  Musculoskeletal:  Positive for falls.  Neurological:  Positive for speech change and focal weakness. Negative for dizziness, sensory change and headaches.  Psychiatric/Behavioral:  Positive for memory loss. Negative for depression. The patient does not have insomnia.         Past Medical History:  Diagnosis Date   Anemia     Anxiety     Depression     Dysmenorrhea     Endometriosis     Fibroid     Hypertension     Implantable loop recorder present 2021   Microhematuria      negative workup   Osteoporosis     Tachycardia     Thrombocytopenia (HCC)               Past Surgical History:  Procedure Laterality Date   BREAST BIOPSY       BUBBLE STUDY   12/08/2020    Procedure: BUBBLE STUDY;  Surgeon: Wendall Stade, MD;  Location: [facility]  ENDOSCOPY;  Service: Cardiovascular;;   CESAREAN SECTION       hysteroscopic resection       implantable loop recorder implant   10/21/2019    Medtronic Reveal Linq model LNQ 22 (SN [id number]) implantable loop recorder   IR CT HEAD LTD   12/01/2020   IR PERCUTANEOUS ART THROMBECTOMY/INFUSION INTRACRANIAL INC DIAG ANGIO   12/01/2020   IR US GUIDE VASC ACCESS RIGHT   12/01/2020   ORIF HUMERUS FRACTURE Left 07/31/2021    Procedure: OPEN REDUCTION INTERNAL FIXATION (ORIF) DISTAL HUMERUS FRACTURE;  Surgeon: Roby Lofts,  MD;  Location: [facility] OR;  Service: Orthopedics;  Laterality: Left;   RADIOLOGY WITH ANESTHESIA N/A 12/01/2020    Procedure: IR WITH ANESTHESIA - CODE STROKE;  Surgeon: Radiologist, Medication, MD;  Location: [facility] OR;  Service: Radiology;  Laterality: N/A;   TEE WITHOUT CARDIOVERSION N/A 12/08/2020    Procedure: TRANSESOPHAGEAL ECHOCARDIOGRAM (TEE);  Surgeon: Wendall Stade, MD;  Location: [facility] ENDOSCOPY;  Service: Cardiovascular;  Laterality: N/A;             Family History  Problem Relation Age of Onset   Cancer Father          non hodgkin lymphoma & skin   Heart attack Maternal Grandfather     Dementia Mother     Polymyositis Sister          Social History:  reports that she has never smoked. She has never used smokeless tobacco. She reports that she does not drink alcohol and does not use drugs. Allergies:  Allergies       Allergies  Allergen Reactions   Avelox [Moxifloxacin] Hives   Ciprofloxacin Hives   Floraquin [Iodoquinol]        No cipro or others   Iohexol         Code: HIVES, Desc: HIVES S/P IVP MANY YRS AGO     Levaquin [Levofloxacin] Hives   Plavix [Clopidogrel] Diarrhea   Quinolones Hives   Shellfish Allergy Itching      Itching under the chin. Described by patient but not seen by husband.            Medications Prior to Admission  Medication Sig Dispense Refill   Amino Acids Complex TABS Take 1 tablet by mouth daily.       aspirin EC 81 MG tablet Take 81 mg by mouth daily. Swallow whole.       CINNAMON PO Take 1 capsule by mouth daily.       Coenzyme Q10 (CO Q 10 PO) Take 1 capsule by mouth daily.       losartan (COZAAR) 25 MG tablet TAKE 1/2 TABLET BY MOUTH DAILY 90 tablet 1   MAGNESIUM PO Take 1 tablet by mouth at bedtime.       Multiple Vitamin (MULTIVITAMIN WITH MINERALS) TABS tablet Take 1 tablet by mouth daily.       Multiple Vitamins-Minerals (ZINC PO) Take 1 tablet by mouth daily.              Home: Home Living Family/patient expects to be  discharged to:: Private residence Living Arrangements: Spouse/significant other Available Help at Discharge: Family Type of Home: House Home Access: Stairs to enter Secretary/administrator of Steps: 5 Entrance Stairs-Rails: Right Home Layout: One level Home Equipment:  (pt unable to provide) Additional Comments: Unsure of accuracy of home set up  Lives With: Spouse  Functional History: Prior Function Prior Level of Function : Patient poor historian/Family not  available Mobility Comments: Pt reports she enjoys walking at baseline Functional Status:  Mobility: Bed Mobility Overal bed mobility: Needs Assistance Bed Mobility: Supine to Sit Supine to sit: Supervision General bed mobility comments: Able to transition to edge of bed without physical assist Transfers Overall transfer level: Needs assistance Equipment used: None Transfers: Sit to/from Stand Sit to Stand: Contact guard assist General transfer comment: Steadying assist to rise to stand from edge of bed and toilet Ambulation/Gait Ambulation/Gait assistance: Min assist Gait Distance (Feet): 15 Feet Assistive device: 1 person hand held assist Gait Pattern/deviations: Step-through pattern, Decreased stride length, Decreased dorsiflexion - right, Decreased dorsiflexion - left, Festinating, Shuffle, Narrow base of support General Gait Details: Festinating type gait with decreased bilateral foot clearance and heel strike at initial contact. Consistent minA for balance and cues for environmental navigation. Decreased gait speed. Gait velocity: decreased Gait velocity interpretation: <1.31 ft/sec, indicative of household ambulator   ADL:   Cognition: Cognition Overall Cognitive Status: No family/caregiver present to determine baseline cognitive functioning Arousal/Alertness: Awake/alert Orientation Level: Oriented to person, Oriented to place, Oriented to situation, Disoriented to time Attention: Sustained Sustained  Attention: Impaired Sustained Attention Impairment: Verbal basic Memory: Impaired Memory Impairment: Decreased recall of new information, Retrieval deficit Awareness: Impaired Awareness Impairment: Intellectual impairment Problem Solving: Impaired Problem Solving Impairment: Functional complex Executive Function: Sequencing, Occupational hygienist: Impaired Sequencing Impairment: Functional basic Organizing: Impaired Organizing Impairment: Functional basic Safety/Judgment: Appears intact Cognition Arousal: Alert Behavior During Therapy: WFL for tasks assessed/performed Overall Cognitive Status: No family/caregiver present to determine baseline cognitive functioning   Blood pressure 124/82, pulse 84, temperature 98.7 F (37.1 C), temperature source Oral, resp. rate 17, height 5\' 4"  (1.626 m), weight 54.7 kg, last menstrual period 01/02/2003, SpO2 97%. Physical Exam Constitutional:      Appearance: Normal appearance.  HENT:     Head: Normocephalic and atraumatic.     Right Ear: External ear normal.     Left Ear: External ear normal.     Mouth/Throat:     Mouth: Mucous membranes are dry.     Pharynx: Oropharynx is clear.  Eyes:     Extraocular Movements: Extraocular movements intact.     Pupils: Pupils are equal, round, and reactive to light.  Cardiovascular:     Rate and Rhythm: Normal rate and regular rhythm.     Heart sounds: Normal heart sounds.  Pulmonary:     Effort: Pulmonary effort is normal.     Breath sounds: Normal breath sounds.  Abdominal:     General: Abdomen is flat. Bowel sounds are normal.     Palpations: Abdomen is soft.  Musculoskeletal:        General: Normal range of motion.  Skin:    General: Skin is warm and dry.  Neurological:     Mental Status: She is alert.     Comments: Awake, alert, and oriented to self and place; time with cues. Moderate memory and attention deficits Moderate right-sided hemineglect Can follow all simple commands Sensation  intact bilaterally Reflexes intact 5 out of 5 strength in left upper and lower extremity 4 out of 5 strength throughout right upper extremity 5- out of 5 strength throughout left lower extremity Right upper extremity ataxia on finger-nose, right upper extremity fine motor deficits Cranial nerves significant for mild left facial droop  Psychiatric:        Mood and Affect: Mood normal.        Behavior: Behavior normal.     Comments: Fair insight  into current deficits        Lab Results Last 24 Hours       Results for orders placed or performed during the hospital encounter of 03/27/23 (from the past 24 hours)  HIV Antibody (routine testing w rflx)     Status: None    Collection Time: 03/27/23  3:29 PM  Result Value Ref Range    HIV Screen 4th Generation wRfx Non Reactive Non Reactive  MRSA Next Gen by PCR, Nasal     Status: None    Collection Time: 03/27/23  9:20 PM    Specimen: Nasal Mucosa; Nasal Swab  Result Value Ref Range    MRSA by PCR Next Gen NOT DETECTED NOT DETECTED  Lipid panel     Status: Abnormal    Collection Time: 03/28/23  5:58 AM  Result Value Ref Range    Cholesterol 173 0 - 200 mg/dL    Triglycerides 540 <981 mg/dL    HDL 42 >19 mg/dL    Total CHOL/HDL Ratio 4.1 RATIO    VLDL 20 0 - 40 mg/dL    LDL Cholesterol 147 (H) 0 - 99 mg/dL       Imaging Results (Last 48 hours)  MR BRAIN WO CONTRAST Result Date: 03/28/2023 CLINICAL DATA:  Stroke follow-up. Neuro deficit with stroke suspected EXAM: MRI HEAD WITHOUT CONTRAST TECHNIQUE: Multiplanar, multiecho pulse sequences of the brain and surrounding structures were obtained without intravenous contrast. COMPARISON:  Brain MRI from yesterday FINDINGS: Brain: Coalescence of the area of restricted diffusion in the left posterior frontal lobe, now mainly isolated to the cortex and more intense. Preexistent extensive chronic infarction along the frontal parietal convexities with brain atrophy. Numerous small chronic  infarcts in the bilateral cerebellum. 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 Small vessel ischemic gliosis seen in the cerebral white matter and bilateral thalamus. There has been atrophy of the deep gray nuclei in addition to the cortex. No hydrocephalus, mass, or collection Vascular: Major flow voids are preserved Skull and upper cervical spine: Normal marrow signal Sinuses/Orbits: Negative IMPRESSION: Small acute infarct in the posterior left frontal cortex. Some of the diffusion hyperintensity on yesterday's scan was reversible. Advanced, widespread chronic ischemic injury with atrophy. Electronically Signed   By: Tiburcio Pea M.D.   On: 03/28/2023 11:57    ECHOCARDIOGRAM COMPLETE Result Date: 03/27/2023    ECHOCARDIOGRAM REPORT   Patient Name:   CASSADIE PANKONIN Hejl Date of Exam: 03/27/2023 Medical Rec #:  [id number]          Height:       64.0 in Accession #:    [id number]         Weight:       120.6 lb Date of Birth:  June 20, 1955          BSA:          1.578 m Patient Age:    67 years           BP:           136/82 mmHg Patient Gender: F                  HR:           73 bpm. Exam Location:  Inpatient Procedure: 2D Echo (Both Spectral and Color Flow Doppler were utilized during            procedure). Indications:    stroke  History:  Patient has prior history of Echocardiogram examinations, most                 recent 12/08/2020. Risk Factors:Hypertension and Dyslipidemia.  Sonographer:    Delcie Roch RDCS Referring Phys: [id number] [facility]  Sonographer Comments: Suboptimal subcostal window. IMPRESSIONS  1. Left ventricular ejection fraction, by estimation, is 60 to 65%. The left ventricle has normal function. The left ventricle has no regional wall motion abnormalities. Left ventricular diastolic parameters are consistent with Grade I diastolic dysfunction (impaired relaxation).  2. Right ventricular systolic function  is normal. The right ventricular size is normal.  3. The mitral valve is normal in structure. Trivial mitral valve regurgitation. No evidence of mitral stenosis.  4. The aortic valve is normal in structure. Aortic valve regurgitation is not visualized. No aortic stenosis is present.  5. The inferior vena cava is normal in size with greater than 50% respiratory variability, suggesting right atrial pressure of 3 mmHg. Conclusion(s)/Recommendation(s): No intracardiac source of embolism detected on this transthoracic study. Consider a transesophageal echocardiogram to exclude cardiac source of embolism if clinically indicated. FINDINGS  Left Ventricle: Left ventricular ejection fraction, by estimation, is 60 to 65%. The left ventricle has normal function. The left ventricle has no regional wall motion abnormalities. The left ventricular internal cavity size was normal in size. There is  no left ventricular hypertrophy. Left ventricular diastolic parameters are consistent with Grade I diastolic dysfunction (impaired relaxation). Right Ventricle: The right ventricular size is normal. No increase in right ventricular wall thickness. Right ventricular systolic function is normal. Left Atrium: Left atrial size was normal in size. Right Atrium: Right atrial size was normal in size. Pericardium: There is no evidence of pericardial effusion. Mitral Valve: The mitral valve is normal in structure. Trivial mitral valve regurgitation. No evidence of mitral valve stenosis. Tricuspid Valve: The tricuspid valve is normal in structure. Tricuspid valve regurgitation is not demonstrated. No evidence of tricuspid stenosis. Aortic Valve: The aortic valve is normal in structure. Aortic valve regurgitation is not visualized. No aortic stenosis is present. Pulmonic Valve: The pulmonic valve was normal in structure. Pulmonic valve regurgitation is not visualized. No evidence of pulmonic stenosis. Aorta: The aortic root is normal in size and  structure. Venous: The inferior vena cava is normal in size with greater than 50% respiratory variability, suggesting right atrial pressure of 3 mmHg. IAS/Shunts: No atrial level shunt detected by color flow Doppler.  LEFT VENTRICLE PLAX 2D LVIDd:         3.40 cm     Diastology LVIDs:         2.70 cm     LV e' medial:    6.42 cm/s LV PW:         0.70 cm     LV E/e' medial:  8.3 LV IVS:        0.80 cm     LV e' lateral:   11.90 cm/s LVOT diam:     1.90 cm     LV E/e' lateral: 4.5 LV SV:         43 LV SV Index:   27 LVOT Area:     2.84 cm  LV Volumes (MOD) LV vol d, MOD A4C: 57.4 ml LV vol s, MOD A4C: 31.6 ml LV SV MOD A4C:     57.4 ml RIGHT VENTRICLE RV Basal diam:  2.50 cm RV S prime:     13.10 cm/s TAPSE (M-mode): 1.8 cm LEFT ATRIUM  Index        RIGHT ATRIUM           Index LA diam:        3.10 cm 1.96 cm/m   RA Area:     11.90 cm LA Vol (A2C):   39.5 ml 25.03 ml/m  RA Volume:   28.10 ml  17.81 ml/m LA Vol (A4C):   21.8 ml 13.82 ml/m LA Biplane Vol: 29.6 ml 18.76 ml/m  AORTIC VALVE LVOT Vmax:   89.30 cm/s LVOT Vmean:  54.800 cm/s LVOT VTI:    0.153 m  AORTA Ao Root diam: 3.40 cm MITRAL VALVE MV Area (PHT): 3.60 cm    SHUNTS MV Decel Time: 211 msec    Systemic VTI:  0.15 m MV E velocity: 53.40 cm/s  Systemic Diam: 1.90 cm MV A velocity: 81.90 cm/s MV E/A ratio:  0.65 Donato Schultz MD Electronically signed by Donato Schultz MD Signature Date/Time: 03/27/2023/4:13:02 PM    Final     MR BRAIN WO CONTRAST Result Date: 03/27/2023 CLINICAL DATA:  Stroke, follow up. EXAM: MRI HEAD WITHOUT CONTRAST TECHNIQUE: Multiplanar, multiecho pulse sequences of the brain and surrounding structures were obtained without intravenous contrast. COMPARISON:  Head CT 03/27/2023 and MRI 12/02/2020 FINDINGS: An abbreviated examination consisting of axial and coronal diffusion weighted imaging and axial FLAIR imaging was performed. There is a small acute left MCA infarct involving the posterosuperior aspect of the insula and  parietal cortex and white matter. There is no significant corresponding cortical FLAIR hyperintensity within the infarct. There are small chronic bilateral frontal, parietal, and occipital cortical infarcts. Confluent T2 hyperintensities in the cerebral white matter bilaterally have progressed from the prior MRI and are nonspecific but compatible with severe chronic small vessel ischemic disease. There is advanced cerebral atrophy. Small chronic bilateral cerebellar infarcts are again noted. No intracranial mass effect or extra-axial fluid collection is identified. No intracranial hemorrhage is identified on this limited examination. IMPRESSION: 1. Small acute left MCA infarct. 2. Severe chronic small vessel ischemic disease and cerebral atrophy. 3. Small chronic bilateral cerebral and cerebellar infarcts. Electronically Signed   By: Sebastian Ache M.D.   On: 03/27/2023 12:10    CT HEAD CODE STROKE WO CONTRAST Result Date: 03/27/2023 CLINICAL DATA:  Code stroke. Neuro deficit, acute, stroke suspected. Aphasia. EXAM: CT HEAD WITHOUT CONTRAST TECHNIQUE: Contiguous axial images were obtained from the base of the skull through the vertex without intravenous contrast. RADIATION DOSE REDUCTION: This exam was performed according to the departmental dose-optimization program which includes automated exposure control, adjustment of the mA and/or kV according to patient size and/or use of iterative reconstruction technique. COMPARISON:  Head CT and MRI 12/02/2020 FINDINGS: Brain: There is no evidence of an acute infarct, intracranial hemorrhage, mass, midline shift, or extra-axial fluid collection. Advanced cerebral atrophy is again noted. Patchy and confluent hypodensities in the cerebral white matter bilaterally are nonspecific but compatible with moderate to severe chronic small vessel ischemic disease. Small chronic cortical infarcts are again seen bilaterally, predominantly in the MCA territories. There are also chronic  lacunar infarcts involving the basal ganglia and cerebellum bilaterally. Vascular: Calcified atherosclerosis at the skull base. No hyperdense vessel. Skull: No acute fracture or suspicious lesion. Sinuses/Orbits: Visualized paranasal sinuses and mastoid air cells are clear. Unremarkable orbits. Other: None. ASPECTS [facility] Stroke Program Early CT Score) - Ganglionic level infarction (caudate, lentiform nuclei, internal capsule, insula, M1-M3 cortex): 7 - Supraganglionic infarction (M4-M6 cortex): 3 Total score (0-10 with 10 being normal):  10 These results were communicated to Dr. Iver Nestle at 10:09 am on 03/27/2023 by text page via the [facility] messaging system. IMPRESSION: 1. No evidence of acute intracranial abnormality. ASPECTS of 10. 2. Advanced cerebral atrophy and chronic ischemia. Electronically Signed   By: Sebastian Ache M.D.   On: 03/27/2023 10:41    CT ANGIO HEAD NECK W WO CM (CODE STROKE) Result Date: 03/27/2023 CLINICAL DATA:  Neuro deficit, acute, stroke suspected.  Aphasia. EXAM: CT ANGIOGRAPHY HEAD AND NECK WITH AND WITHOUT CONTRAST TECHNIQUE: Multidetector CT imaging of the head and neck was performed using the standard protocol during bolus administration of intravenous contrast. Multiplanar CT image reconstructions and MIPs were obtained to evaluate the vascular anatomy. Carotid stenosis measurements (when applicable) are obtained utilizing NASCET criteria, using the distal internal carotid diameter as the denominator. RADIATION DOSE REDUCTION: This exam was performed according to the departmental dose-optimization program which includes automated exposure control, adjustment of the mA and/or kV according to patient size and/or use of iterative reconstruction technique. CONTRAST:  75mL OMNIPAQUE IOHEXOL 350 MG/ML SOLN COMPARISON:  MRA head 12/02/2020.  CTA head and neck 12/01/2020. FINDINGS: CTA NECK FINDINGS Aortic arch: Standard branching with mild atherosclerosis. No significant stenosis of the arch  vessel origins. Right carotid system: Patent without evidence of stenosis or dissection. Left carotid system: Patent without evidence of stenosis or dissection. Vertebral arteries: Patent and codominant without evidence of stenosis or dissection. Skeleton: Bilateral facet ankylosis at C2-3. Asymmetric, severe left facet arthrosis at C3-4. Mild cervical disc degeneration. Other neck: No evidence of cervical lymphadenopathy or mass. Upper chest: No mass or consolidation in the included lung apices. Review of the MIP images confirms the above findings CTA HEAD FINDINGS Anterior circulation: The internal carotid arteries are patent from skull base to carotid termini with mild atherosclerosis bilaterally not resulting in a significant stenosis. ACAs and MCAs are patent without evidence of a proximal branch occlusion or significant proximal stenosis. There is a severe stenosis of a left distal M2/proximal M3 branch vessel (series 11, image 34). No aneurysm is identified. Posterior circulation: The intracranial vertebral arteries are widely patent to the basilar. Patent PICA and SCA origins are visualized bilaterally. The basilar artery is widely patent. There is likely a diminutive left posterior communicating artery. Both PCAs are patent without evidence of a significant proximal stenosis on the left. There is a new moderate mid right P2 stenosis, and there is also a new severe more distal right P2 stenosis at a branch point. No aneurysm is identified. Venous sinuses: Poorly evaluated due to contrast timing. Anatomic variants: None. Review of the MIP images confirms the above findings The preliminary finding of no emergent large vessel occlusion was communicated to Dr. Iver Nestle at 10:21 am on 03/27/2023 by text page via the [facility] messaging system. IMPRESSION: 1. No large vessel occlusion. 2. Severe distal left M2/proximal M3 stenosis. 3. Moderate mid and severe distal right P2 stenoses. 4. No significant stenosis in the  neck. 5.  Aortic Atherosclerosis (ICD10-I70.0). Electronically Signed   By: Sebastian Ache M.D.   On: 03/27/2023 10:38       Assessment/Plan: Diagnosis: Left MCA CVA, cryptogenic, status post TNK Does the need for close, 24 hr/day medical supervision in concert with the patient's rehab needs make it unreasonable for this patient to be served in a less intensive setting? Yes Co-Morbidities requiring supervision/potential complications: Cognitive deficits, tachycardia, right wrist pain/prior fracture, confabulation/hallucinations, and generalized debility with multiple recent falls Due to safety, skin/wound care, disease  management, medication administration, pain management, and patient education, does the patient require 24 hr/day rehab nursing? Yes Does the patient require coordinated care of a physician, rehab nurse, therapy disciplines of PT, OT, and SLP to address physical and functional deficits in the context of the above medical diagnosis(es)? Yes Addressing deficits in the following areas: balance, endurance, locomotion, strength, transferring, bathing, dressing, feeding, grooming, toileting, cognition, and speech Can the patient actively participate in an intensive therapy program of at least 3 hrs of therapy per day at least 5 days per week? Yes The potential for patient to make measurable gains while on inpatient rehab is excellent Anticipated functional outcomes upon discharge from inpatient rehab are supervision  with PT, supervision with OT, supervision with SLP. Estimated rehab length of stay to reach the above functional goals is: 10-14 days Anticipated discharge destination: Home Overall Rehab/Functional Prognosis: good   POST ACUTE RECOMMENDATIONS: This patient's condition is appropriate for continued rehabilitative care in the following setting: CIR Patient has agreed to participate in recommended program. Yes Note that insurance prior authorization may be required for  reimbursement for recommended care.   Comment: Ms. Shary Decamp is a 68 year old female here status post left-sided MCA shortly after being taken off of Brilinta for prophylaxis.  She has had multiple inpatient rehab stays for prior strokes, with good outcomes and some ongoing memory deficits.  Prior to her most recent stroke, she was showing some signs of increasing debility from multiple falls and decreased ambulatory confidence, resulting in increasing dependence on her husband.  She would benefit from a inpatient rehab stay to work both on stroke recovery and reconditioning to increase ambulatory tolerance and use risk of further falls.  She lives in a single-story home with her husband, who can provide 24/7 for support.     MEDICAL RECOMMENDATIONS: Recommend neuropsychiatric consultation once in inpatient rehab to assess worsening memory deficits and confabulations per husband Consider removal of DVT chemoprophylaxis while resuming DAPT, given significant risk for ongoing falls; could switch to SCDs     I have personally performed a face to face diagnostic evaluation of this patient. Additionally, I have examined the patient's medical record including any pertinent labs and radiographic images. If the physician assistant has documented in this note, I have reviewed and edited or otherwise concur with the physician assistant's documentation.   Thanks,   Angelina Sheriff, DO 03/28/2023

## 2023-04-01 NOTE — H&P (Signed)
 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  She did have a loop recorder placed at that time and maintained on aspirin.  She was taken off of Brilinta in February 2025.  Patient received received inpatient rehab services 12/08/2020 - 12/17/2020 and was discharged to home contact-guard for ambulation.  Per chart review patient lives with spouse.  1 level home 5 steps to entry.  Presented 03/27/2023 with aphasia, altered mental status and unstable gait with frequent falls.  CT/MRI showed small acute left MCA infarction.  Severe chronic small vessel ischemic disease and cerebral atrophy.  CTA showed no large vessel occlusion.She did receive TNK.  Admission chemistries unremarkable, urine drug screen positive marijuana.  Echocardiogram ejection fraction of 60 to 65% no wall motion abnormalities grade 1 diastolic dysfunction.  Neurology follow-up currently maintained on aspirin as well as Brilinta 90 mg twice daily with study drug followed by neurology services.  Tolerating a regular consistency diet.  Therapy evaluations completed and due to patient's decreased functional mobility was admitted for a comprehensive rehab program.   Review of Systems  Constitutional:  Negative for chills and fever.  HENT:  Negative for hearing loss.   Eyes:  Negative for blurred vision and double vision.  Respiratory:  Negative for cough, shortness of breath and wheezing.   Cardiovascular:  Negative for chest pain and palpitations.  Gastrointestinal:  Positive for constipation. Negative for heartburn, nausea and vomiting.  Genitourinary:  Negative for dysuria, flank  pain and hematuria.  Musculoskeletal:  Positive for falls.  Skin:  Negative for rash.  Neurological:  Positive for speech change and weakness.  Psychiatric/Behavioral:  Positive for depression.        Anxiety  All other systems reviewed and are negative.             Past Medical History:  Diagnosis Date   Anemia     Anxiety     Depression     Dysmenorrhea     Endometriosis     Fibroid     Hypertension     Implantable loop recorder present 2021   Microhematuria      negative workup   Osteoporosis     Tachycardia     Thrombocytopenia (HCC)                        Past Surgical History:  Procedure Laterality Date   BREAST BIOPSY       BUBBLE STUDY   12/08/2020    Procedure: BUBBLE STUDY;  Surgeon: Wendall Stade, MD;  Location: [facility] ENDOSCOPY;  Service: Cardiovascular;;   CESAREAN SECTION       hysteroscopic resection       implantable loop recorder implant   10/21/2019    Medtronic Reveal Linq model LNQ 22 ([place] [id number]) implantable loop recorder   IR CT HEAD LTD   12/01/2020   IR PERCUTANEOUS ART THROMBECTOMY/INFUSION INTRACRANIAL INC DIAG ANGIO   12/01/2020   IR US GUIDE VASC ACCESS RIGHT   12/01/2020   ORIF HUMERUS FRACTURE Left 07/31/2021    Procedure: OPEN REDUCTION INTERNAL FIXATION (ORIF) DISTAL HUMERUS FRACTURE;  Surgeon: Roby Lofts, MD;  Location: [facility] OR;  Service: Orthopedics;  Laterality: Left;   RADIOLOGY WITH ANESTHESIA N/A 12/01/2020    Procedure: IR WITH ANESTHESIA - CODE STROKE;  Surgeon: Radiologist, Medication, MD;  Location: [facility] OR;  Service: Radiology;  Laterality: N/A;   TEE WITHOUT CARDIOVERSION N/A 12/08/2020    Procedure: TRANSESOPHAGEAL ECHOCARDIOGRAM (TEE);  Surgeon: Wendall Stade, MD;  Location: [facility] ENDOSCOPY;  Service: Cardiovascular;  Laterality: N/A;                      Family History  Problem Relation Age of Onset   Cancer Father          non hodgkin lymphoma & skin   Heart attack Maternal Grandfather     Dementia Mother      Polymyositis Sister            Social History:  reports that she has never smoked. She has never used smokeless tobacco. She reports that she does not drink alcohol and does not use drugs. Allergies:  Allergies           Allergies  Allergen Reactions   Avelox [Moxifloxacin] Hives   Ciprofloxacin Hives   Floraquin [Iodoquinol]        No cipro or others   Iohexol         Code: HIVES, Desc: HIVES S/P IVP MANY YRS AGO     Levaquin [Levofloxacin] Hives   Plavix [Clopidogrel] Diarrhea   Quinolones Hives   Shellfish Allergy Itching      Itching under the chin. Described by patient but not seen by husband.                    Medications Prior to Admission  Medication Sig Dispense Refill   Amino Acids Complex TABS Take 1 tablet by mouth daily.       aspirin EC 81 MG tablet Take 81 mg by mouth daily. Swallow whole.       CINNAMON PO Take 1 capsule by mouth daily.       Coenzyme Q10 (CO Q 10 PO) Take 1 capsule by mouth daily.       losartan (COZAAR) 25 MG tablet TAKE 1/2 TABLET BY MOUTH DAILY 90 tablet 1   MAGNESIUM PO Take 1 tablet by mouth at bedtime.       Multiple Vitamin (MULTIVITAMIN WITH MINERALS) TABS tablet Take 1 tablet by mouth daily.       Multiple Vitamins-Minerals (ZINC PO) Take 1 tablet by mouth daily.                    Home: Home Living Family/patient expects to be discharged to:: Private residence Living Arrangements: Spouse/significant other Available Help at Discharge: Family, Available 24 hours/day (spouse providing 24/7 supervision for the past 4 years) Type of Home: House Home Access: Stairs to enter Secretary/administrator of Steps: 5 Entrance Stairs-Rails: Right Home Layout: One level Bathroom Shower/Tub: Health visitor: Handicapped height Bathroom Accessibility: Yes Home Equipment:  (pt unable to provide) Additional Comments: Unsure of accuracy of home set up  Lives With: Spouse   Functional History: Prior Function Prior Level  of Function : Patient poor historian/Family not available Mobility Comments: Pt reports she enjoys walking at baseline ADLs Comments: spouse provides supervision for all adls per spouse in the past 4 years   Functional Status:  Mobility: Bed Mobility Overal bed mobility: Needs Assistance Bed Mobility: Supine to Sit Supine to sit: Supervision General bed  mobility comments: up in recliner on arrival and departure. Transfers Overall transfer level: Needs assistance Equipment used: None Transfers: Sit to/from Stand Sit to Stand: Contact guard assist Bed to/from chair/wheelchair/BSC transfer type:: Step pivot Step pivot transfers: Min assist, Contact guard assist General transfer comment: Steadying assist to rise to stand from recliner Ambulation/Gait Ambulation/Gait assistance: Min assist, Contact guard assist Gait Distance (Feet): 300 Feet Assistive device: 1 person hand held assist Gait Pattern/deviations: Step-through pattern, Decreased stride length, Decreased dorsiflexion - right, Decreased dorsiflexion - left, Shuffle, Narrow base of support General Gait Details: Decreased bilateral foot clearance and heel strike at initial contact. Consistent minA for balance and cues for environmental navigation. Decreased gait speed. Gait velocity: decreased Gait velocity interpretation: 1.31 - 2.62 ft/sec, indicative of limited community ambulator Stairs: Yes Stairs assistance: Contact guard assist Stair Management: One rail Right, Forwards, Backwards Number of Stairs: 1 General stair comments: HHA   ADL: ADL Overall ADL's : Needs assistance/impaired Eating/Feeding: Minimal assistance, Sitting Grooming: Minimal assistance, Standing Upper Body Bathing: Minimal assistance, Sitting Lower Body Bathing: Moderate assistance, Sit to/from stand Upper Body Dressing : Moderate assistance, Sitting Lower Body Dressing: Moderate assistance, Sit to/from stand Toilet Transfer: Minimal assistance,  Regular Toilet, Ambulation   Cognition: Cognition Overall Cognitive Status: History of cognitive impairments - at baseline Arousal/Alertness: Awake/alert Orientation Level: Oriented X4 Attention: Sustained Sustained Attention: Impaired Sustained Attention Impairment: Verbal basic Memory: Impaired Memory Impairment: Decreased recall of new information, Retrieval deficit Awareness: Impaired Awareness Impairment: Intellectual impairment Problem Solving: Impaired Problem Solving Impairment: Functional complex Executive Function: Sequencing, Organizing Sequencing: Impaired Sequencing Impairment: Functional basic Organizing: Impaired Organizing Impairment: Functional basic Safety/Judgment: Appears intact Cognition Arousal: Alert Behavior During Therapy: WFL for tasks assessed/performed Overall Cognitive Status: History of cognitive impairments - at baseline   Physical Exam: Blood pressure (!) 142/75, pulse 84, temperature 98.2 F (36.8 C), temperature source Oral, resp. rate 18, height 5\' 4"  (1.626 m), weight 51.9 kg, last menstrual period 01/02/2003, SpO2 100%. Physical Exam Neurological:     Comments: Patient is alert.  Up ambulating with assistance to the bathroom.  Makes eye contact with examiner.  Follows simple commands.  She provides her name and did have some delay in processing for providing age.  Limited medical historian.  Needed some cues for safety.      General: No acute distress Mood and affect are appropriate Heart: Regular rate and rhythm no rubs murmurs or extra sounds Lungs: Clear to auscultation, breathing unlabored, no rales or wheezes Abdomen: Positive bowel sounds, soft nontender to palpation, nondistended Extremities: No clubbing, cyanosis, or edema Skin: No evidence of breakdown, no evidence of rash Neurologic: Cranial nerves II through XII intact, motor strength is 5/5 left and 4/5 right deltoid, bicep, tricep, grip, 5/5 bilateral hip flexor, knee  extensors, ankle dorsiflexor and plantar flexor Sensory exam normal sensation to light touch  in bilateral upper and lower extremities Cerebellar exam normal finger to nose to finger as well as heel to shin in bilateral upper and lower extremities Finger to thumb opposition slower on the right upper extremity than in the left upper extremity No dysarthria, mildly pressured speech Musculoskeletal: Full range of motion in all 4 extremities. No joint swelling    Lab Results Last 48 Hours  No results found for this or any previous visit (from the past 48 hours).    Imaging Results (Last 48 hours)  No results found.          Blood pressure (!) 142/75, pulse 84,  temperature 98.2 F (36.8 C), temperature source Oral, resp. rate 18, height 5\' 4"  (1.626 m), weight 51.9 kg, last menstrual period 01/02/2003, SpO2 100%.   Medical Problem List and Plan: 1. Functional deficits secondary to left MCA infarction status post TNK as well as history of left MCA infarction 2022 and received CIR             -patient may shower             -ELOS/Goals: 7 to 10 days supervision goals 2.  Antithrombotics: -DVT/anticoagulation:  Mechanical: Antiembolism stockings, thigh (TED hose) Bilateral lower extremities             -antiplatelet therapy: Aspirin 81 mg daily and Brilinta 90 mg twice daily x 4 weeks then aspirin alone.  Patient enrolled in [place] stroke research study 3. Pain Management: Tylenol as needed 4. Mood/Behavior/Sleep: Provide emotional support             -antipsychotic agents: N/A 5. Neuropsych/cognition: This patient is not capable of making decisions on her own behalf. 6. Skin/Wound Care: Routine skin checks 7. Fluids/Electrolytes/Nutrition: Routine in and outs with follow-up chemistries 8.  Hyperlipidemia.  Lipitor .9.  History of thrombocytopenia.  Latest platelet count 262,000 10.  Urine drug screen positive marijuana.  Counseling       Charlton Amor, PA-C 04/01/2023    "I  have personally performed a face to face diagnostic evaluation of this patient.  Additionally, I have reviewed and concur with the physician assistant's documentation above."  [name] M.D. [facility] Fellow Am Acad of Phys Med and Rehab Diplomate Am Board of Electrodiagnostic Med Fellow Am Board of Interventional Pain

## 2023-04-01 NOTE — Care Management Important Message (Signed)
 Important Message  Patient Details  Name: Jacqueline Bonilla MRN: [id number] Date of Birth: Feb 05, 1955   Important Message Given:  Yes - Medicare IM     Dorena Bodo 04/01/2023, 3:59 PM

## 2023-04-01 NOTE — Discharge Summary (Signed)
 Physician Discharge Summary  Patient ID: Jacqueline Bonilla MRN: [id number] DOB/AGE: August 01, 1955 68 y.o.  Admit date: 04/01/2023 Discharge date: 04/10/2023  Discharge Diagnoses:  Active Problems:   Left middle cerebral artery stroke (HCC) Hyperlipidemia History of left MCA infarction December 2022 History of thrombocytopenia Urine drug screen positive marijuana Mood stabilization  Discharged Condition: Stable  Significant Diagnostic Studies: MR BRAIN WO CONTRAST Result Date: 03/28/2023 CLINICAL DATA:  Stroke follow-up. Neuro deficit with stroke suspected EXAM: MRI HEAD WITHOUT CONTRAST TECHNIQUE: Multiplanar, multiecho pulse sequences of the brain and surrounding structures were obtained without intravenous contrast. COMPARISON:  Brain MRI from yesterday FINDINGS: Brain: Coalescence of the area of restricted diffusion in the left posterior frontal lobe, now mainly isolated to the cortex and more intense. Preexistent extensive chronic infarction along the frontal parietal convexities with brain atrophy. Numerous small chronic infarcts in the bilateral cerebellum. 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 Small vessel ischemic gliosis seen in the cerebral white matter and bilateral thalamus. There has been atrophy of the deep gray nuclei in addition to the cortex. No hydrocephalus, mass, or collection Vascular: Major flow voids are preserved Skull and upper cervical spine: Normal marrow signal Sinuses/Orbits: Negative IMPRESSION: Small acute infarct in the posterior left frontal cortex. Some of the diffusion hyperintensity on yesterday's scan was reversible. Advanced, widespread chronic ischemic injury with atrophy. Electronically Signed   By: Tiburcio Pea M.D.   On: 03/28/2023 11:57   ECHOCARDIOGRAM COMPLETE Result Date: 03/27/2023    ECHOCARDIOGRAM REPORT   Patient Name:   Jacqueline Bonilla Date of Exam: 03/27/2023  Medical Rec #:  [id number]          Height:       64.0 in Accession #:    [id number]         Weight:       120.6 lb Date of Birth:  12-18-1955          BSA:          1.578 m Patient Age:    67 years           BP:           136/82 mmHg Patient Gender: F                  HR:           73 bpm. Exam Location:  Inpatient Procedure: 2D Echo (Both Spectral and Color Flow Doppler were utilized during            procedure). Indications:    stroke  History:        Patient has prior history of Echocardiogram examinations, most                 recent 12/08/2020. Risk Factors:Hypertension and Dyslipidemia.  Sonographer:    Delcie Roch RDCS Referring Phys: [id number] [facility]  Sonographer Comments: Suboptimal subcostal window. IMPRESSIONS  1. Left ventricular ejection fraction, by estimation, is 60 to 65%. The left ventricle has normal function. The left ventricle has no regional wall motion abnormalities. Left ventricular diastolic parameters are consistent with Grade I diastolic dysfunction (impaired relaxation).  2. Right ventricular systolic function is normal. The right ventricular size is normal.  3. The mitral valve is normal in structure. Trivial mitral valve regurgitation. No evidence of mitral stenosis.  4. The aortic valve is normal in structure. Aortic valve regurgitation is not visualized. No aortic stenosis is present.  5. The inferior vena cava is normal in size with greater than 50% respiratory variability, suggesting right atrial pressure of 3 mmHg. Conclusion(s)/Recommendation(s): No intracardiac source of embolism detected on this transthoracic study. Consider a transesophageal echocardiogram to exclude cardiac source of embolism if clinically indicated. FINDINGS  Left Ventricle: Left ventricular ejection fraction, by estimation, is 60 to 65%. The left ventricle has normal function. The left ventricle has no regional wall motion abnormalities. The left ventricular internal cavity size was normal in size.  There is  no left ventricular hypertrophy. Left ventricular diastolic parameters are consistent with Grade I diastolic dysfunction (impaired relaxation). Right Ventricle: The right ventricular size is normal. No increase in right ventricular wall thickness. Right ventricular systolic function is normal. Left Atrium: Left atrial size was normal in size. Right Atrium: Right atrial size was normal in size. Pericardium: There is no evidence of pericardial effusion. Mitral Valve: The mitral valve is normal in structure. Trivial mitral valve regurgitation. No evidence of mitral valve stenosis. Tricuspid Valve: The tricuspid valve is normal in structure. Tricuspid valve regurgitation is not demonstrated. No evidence of tricuspid stenosis. Aortic Valve: The aortic valve is normal in structure. Aortic valve regurgitation is not visualized. No aortic stenosis is present. Pulmonic Valve: The pulmonic valve was normal in structure. Pulmonic valve regurgitation is not visualized. No evidence of pulmonic stenosis. Aorta: The aortic root is normal in size and structure. Venous: The inferior vena cava is normal in size with greater than 50% respiratory variability, suggesting right atrial pressure of 3 mmHg. IAS/Shunts: No atrial level shunt detected by color flow Doppler.  LEFT VENTRICLE PLAX 2D LVIDd:         3.40 cm     Diastology LVIDs:         2.70 cm     LV e' medial:    6.42 cm/s LV PW:         0.70 cm     LV E/e' medial:  8.3 LV IVS:        0.80 cm     LV e' lateral:   11.90 cm/s LVOT diam:     1.90 cm     LV E/e' lateral: 4.5 LV SV:         43 LV SV Index:   27 LVOT Area:     2.84 cm  LV Volumes (MOD) LV vol d, MOD A4C: 57.4 ml LV vol s, MOD A4C: 31.6 ml LV SV MOD A4C:     57.4 ml RIGHT VENTRICLE RV Basal diam:  2.50 cm RV S prime:     13.10 cm/s TAPSE (M-mode): 1.8 cm LEFT ATRIUM             Index        RIGHT ATRIUM           Index LA diam:        3.10 cm 1.96 cm/m   RA Area:     11.90 cm LA Vol (A2C):   39.5 ml 25.03  ml/m  RA Volume:   28.10 ml  17.81 ml/m LA Vol (A4C):   21.8 ml 13.82 ml/m LA Biplane Vol: 29.6 ml 18.76 ml/m  AORTIC VALVE LVOT Vmax:   89.30 cm/s LVOT Vmean:  54.800 cm/s LVOT VTI:    0.153 m  AORTA Ao Root diam: 3.40 cm MITRAL VALVE MV Area (PHT): 3.60 cm    SHUNTS MV Decel Time: 211 msec    Systemic VTI:  0.15 m MV E velocity:  53.40 cm/s  Systemic Diam: 1.90 cm MV A velocity: 81.90 cm/s MV E/A ratio:  0.65 Donato Schultz MD Electronically signed by Donato Schultz MD Signature Date/Time: 03/27/2023/4:13:02 PM    Final    MR BRAIN WO CONTRAST Result Date: 03/27/2023 CLINICAL DATA:  Stroke, follow up. EXAM: MRI HEAD WITHOUT CONTRAST TECHNIQUE: Multiplanar, multiecho pulse sequences of the brain and surrounding structures were obtained without intravenous contrast. COMPARISON:  Head CT 03/27/2023 and MRI 12/02/2020 FINDINGS: An abbreviated examination consisting of axial and coronal diffusion weighted imaging and axial FLAIR imaging was performed. There is a small acute left MCA infarct involving the posterosuperior aspect of the insula and parietal cortex and white matter. There is no significant corresponding cortical FLAIR hyperintensity within the infarct. There are small chronic bilateral frontal, parietal, and occipital cortical infarcts. Confluent T2 hyperintensities in the cerebral white matter bilaterally have progressed from the prior MRI and are nonspecific but compatible with severe chronic small vessel ischemic disease. There is advanced cerebral atrophy. Small chronic bilateral cerebellar infarcts are again noted. No intracranial mass effect or extra-axial fluid collection is identified. No intracranial hemorrhage is identified on this limited examination. IMPRESSION: 1. Small acute left MCA infarct. 2. Severe chronic small vessel ischemic disease and cerebral atrophy. 3. Small chronic bilateral cerebral and cerebellar infarcts. Electronically Signed   By: Sebastian Ache M.D.   On: 03/27/2023 12:10    CT HEAD CODE STROKE WO CONTRAST Result Date: 03/27/2023 CLINICAL DATA:  Code stroke. Neuro deficit, acute, stroke suspected. Aphasia. EXAM: CT HEAD WITHOUT CONTRAST TECHNIQUE: Contiguous axial images were obtained from the base of the skull through the vertex without intravenous contrast. RADIATION DOSE REDUCTION: This exam was performed according to the departmental dose-optimization program which includes automated exposure control, adjustment of the mA and/or kV according to patient size and/or use of iterative reconstruction technique. COMPARISON:  Head CT and MRI 12/02/2020 FINDINGS: Brain: There is no evidence of an acute infarct, intracranial hemorrhage, mass, midline shift, or extra-axial fluid collection. Advanced cerebral atrophy is again noted. Patchy and confluent hypodensities in the cerebral white matter bilaterally are nonspecific but compatible with moderate to severe chronic small vessel ischemic disease. Small chronic cortical infarcts are again seen bilaterally, predominantly in the MCA territories. There are also chronic lacunar infarcts involving the basal ganglia and cerebellum bilaterally. Vascular: Calcified atherosclerosis at the skull base. No hyperdense vessel. Skull: No acute fracture or suspicious lesion. Sinuses/Orbits: Visualized paranasal sinuses and mastoid air cells are clear. Unremarkable orbits. Other: None. ASPECTS (Alberta Stroke Program Early CT Score) - Ganglionic level infarction (caudate, lentiform nuclei, internal capsule, insula, M1-M3 cortex): 7 - Supraganglionic infarction (M4-M6 cortex): 3 Total score (0-10 with 10 being normal): 10 These results were communicated to Dr. Iver Nestle at 10:09 am on 03/27/2023 by text page via the [facility] messaging system. IMPRESSION: 1. No evidence of acute intracranial abnormality. ASPECTS of 10. 2. Advanced cerebral atrophy and chronic ischemia. Electronically Signed   By: Sebastian Ache M.D.   On: 03/27/2023 10:41   CT ANGIO HEAD NECK W  WO CM (CODE STROKE) Result Date: 03/27/2023 CLINICAL DATA:  Neuro deficit, acute, stroke suspected.  Aphasia. EXAM: CT ANGIOGRAPHY HEAD AND NECK WITH AND WITHOUT CONTRAST TECHNIQUE: Multidetector CT imaging of the head and neck was performed using the standard protocol during bolus administration of intravenous contrast. Multiplanar CT image reconstructions and MIPs were obtained to evaluate the vascular anatomy. Carotid stenosis measurements (when applicable) are obtained utilizing NASCET criteria, using the distal internal carotid  diameter as the denominator. RADIATION DOSE REDUCTION: This exam was performed according to the departmental dose-optimization program which includes automated exposure control, adjustment of the mA and/or kV according to patient size and/or use of iterative reconstruction technique. CONTRAST:  75mL OMNIPAQUE IOHEXOL 350 MG/ML SOLN COMPARISON:  MRA head 12/02/2020.  CTA head and neck 12/01/2020. FINDINGS: CTA NECK FINDINGS Aortic arch: Standard branching with mild atherosclerosis. No significant stenosis of the arch vessel origins. Right carotid system: Patent without evidence of stenosis or dissection. Left carotid system: Patent without evidence of stenosis or dissection. Vertebral arteries: Patent and codominant without evidence of stenosis or dissection. Skeleton: Bilateral facet ankylosis at C2-3. Asymmetric, severe left facet arthrosis at C3-4. Mild cervical disc degeneration. Other neck: No evidence of cervical lymphadenopathy or mass. Upper chest: No mass or consolidation in the included lung apices. Review of the MIP images confirms the above findings CTA HEAD FINDINGS Anterior circulation: The internal carotid arteries are patent from skull base to carotid termini with mild atherosclerosis bilaterally not resulting in a significant stenosis. ACAs and MCAs are patent without evidence of a proximal branch occlusion or significant proximal stenosis. There is a severe stenosis of  a left distal M2/proximal M3 branch vessel (series 11, image 34). No aneurysm is identified. Posterior circulation: The intracranial vertebral arteries are widely patent to the basilar. Patent PICA and SCA origins are visualized bilaterally. The basilar artery is widely patent. There is likely a diminutive left posterior communicating artery. Both PCAs are patent without evidence of a significant proximal stenosis on the left. There is a new moderate mid right P2 stenosis, and there is also a new severe more distal right P2 stenosis at a branch point. No aneurysm is identified. Venous sinuses: Poorly evaluated due to contrast timing. Anatomic variants: None. Review of the MIP images confirms the above findings The preliminary finding of no emergent large vessel occlusion was communicated to Dr. Iver Nestle at 10:21 am on 03/27/2023 by text page via the [facility] messaging system. IMPRESSION: 1. No large vessel occlusion. 2. Severe distal left M2/proximal M3 stenosis. 3. Moderate mid and severe distal right P2 stenoses. 4. No significant stenosis in the neck. 5.  Aortic Atherosclerosis (ICD10-I70.0). Electronically Signed   By: Sebastian Ache M.D.   On: 03/27/2023 10:38   CUP PACEART REMOTE DEVICE CHECK Result Date: 03/11/2023 ILR summary report received. Battery status OK. Normal device function. No new symptom, tachy, brady, or pause episodes. No new AF episodes. Monthly summary reports and ROV/PRN LA, CVRS   Labs:  Basic Metabolic Panel: Recent Labs  Lab 04/02/23 0525  NA 138  K 4.0  CL 104  CO2 24  GLUCOSE 95  BUN 17  CREATININE 1.05*  CALCIUM 9.4    CBC: Recent Labs  Lab 04/02/23 0525  WBC 4.0  NEUTROABS 2.4  HGB 12.0  HCT 36.3  MCV 88.8  PLT 226    CBG: No results for input(s): "GLUCAP" in the last 168 hours.  Family history.  Father with non-Hodgkin's lymphoma mother with dementia.  Denies any colon cancer esophageal cancer or rectal cancer  Brief HPI:   Jacqueline Bonilla is a 68  y.o. 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  She did have a loop recorder placed at that time and maintained on aspirin.  She was taken off Brilinta in February 2025.  Patient  received inpatient rehab services 12/08/2020 - 12/17/2020 and was discharged to home contact-guard assist for ambulation.  Per chart review she lives with spouse.  1 level home 5 steps to entry.  Presented 03/27/2023 with aphasia altered mental status and unstable gait with frequent falls.  CT/MRI showed small acute left MCA infarction.  Severe chronic small vessel ischemic disease and cerebral atrophy.  CTA showed no large vessel occlusion.  She did receive TNK.  Admission chemistries unremarkable urine drug screen positive marijuana.  Echocardiogram with ejection fraction of 60 to 65% no wall motion abnormalities grade 1 diastolic dysfunction.  Neurology follow-up maintained on aspirin as well as Brilinta 90 mg twice daily x 4 weeks then aspirin alone with study drug followed by neurology services.  Tolerating a regular consistency diet.  Therapy evaluations completed due to patient decreased functional mobility was admitted for a comprehensive rehab program.   Hospital Course: Jacqueline Bonilla was admitted to rehab 04/01/2023 for inpatient therapies to consist of PT, ST and OT at least three hours five days a week. Past admission physiatrist, therapy team and rehab RN have worked together to provide customized collaborative inpatient rehab.  Pertaining to patient's left MCA infarction status post TNK as well as history of left MCA infarction 2022 remained stable followed by neurology services maintained on low-dose aspirin and Brilinta 90 mg twice daily x 4 weeks then aspirin alone as well as enroll in [place] stroke  research study.  Blood pressure remained remained soft patient had been on losartan resume as needed.  Lipitor ongoing for hyperlipidemia.  History of thrombocytopenia latest platelet count 262,000.  Noted urine drug screen positive marijuana on admission receiving counts regards to cessation of marijuana.  Blood pressure with cozaar   Blood pressures were monitored on TID basis and remained controlled and monitored      Rehab course: During patient's stay in rehab weekly team conferences were held to monitor patient's progress, set goals and discuss barriers to discharge. At admission, patient required minimal assist contact-guard 300 feet 1 person hand-held assist minimal assist step pivot transfers  Physical exam.  Blood pressure 142/75 pulse 84 temperature 98.2 respirations 18 oxygen saturation 100% room air Constitutional.  No acute distress Neurologic.  Patient alert.  Up ambulating with assistance to the bathroom.  Makes eye contact with examiner.  Follows simple commands.  She provides her name and did have some delay in processing for providing age.  Limited medical historian.  Needed some cues for safety. HEENT Head.  Normocephalic and atraumatic Eyes.  Pupils round and reactive to light no discharge without nystagmus Neck.  Supple nontender no JVD without thyromegaly Cardiac regular rate and rhythm without any extra sounds or murmur heard Abdomen.  Soft nontender positive bowel sounds without rebound Respiratory effort normal no respiratory distress without wheeze Extremities.  No clubbing cyanosis or edema Manual muscle testing.  Cranial nerves II through XII intact motor strength 5/5 left and 4/5 right deltoid bicep tricep grip 5/5 bilateral hip flexors knee extensors ankle dorsi plantar flexor.  Sensory exam normal.  He/She  has had improvement in activity tolerance, balance, postural control as well as ability to compensate for deficits. He/She has had improvement in functional  use RUE/LUE  and RLE/LLE as well as improvement in awareness.  Working with energy conservation techniques.  Requires minimal assist for mobility secondary to muscle weakness.  Contact-guard assist for lying to sitting.  Sit to stand contact-guard.  Ambulate short distance with hand-held assist.  Requires  minimal assist with basic self-care skills secondary to impaired timing and sequencing.  Patient independently oriented to self location and situation, though required minimal assist oriented to correct age at times.  SLP targeted problem-solving and attention goals to compare/contrast task.  Required minimal assist to label held 2 items were alike and different.  Full family teaching completed recommendations of 24-hour assist discharge.  Patient was discharged to home       Disposition:  There are no questions and answers to display.         Diet: Regular  Special Instructions: No driving smoking or alcohol  Medications at discharge. 1.  Tylenol as needed 2.  Aspirin 81 mg p.o. daily 3.  Lipitor 40 mg p.o. daily 4.  Librexia 1 tablet p.o. twice daily 5.  Brilinta 90 mg p.o. twice daily x 4 weeks total then aspirin alone 6.MVI daily 7.Amino acids one tab daily 8.Cinnamon one capsule daily 9.CO Q 10 one daily 10.  Cozaar 25 mg 1/2 tablet daily 11.Magnesium one tab daily   30-35 minutes were spent completing discharge summary and discharge planning  Discharge Instructions     Ambulatory referral to Neurology   Complete by: As directed    An appointment is requested in approximately: 4 weeks left MCA infarction   Ambulatory referral to Physical Medicine Rehab   Complete by: As directed    Moderate complexity follow-up 1 to 2 weeks left MCA infarction        Follow-up Information     Kirsteins, Victorino Sparrow, MD Follow up.   Specialty: Physical Medicine and Rehabilitation Why: Office to call for appointment Contact information: [address] [place] [address] [name]  [address] [phone]                 Signed: Charlton Amor 04/08/2023, 9:52 AM

## 2023-04-01 NOTE — TOC Transition Note (Signed)
 Transition of Care [facility]) - Discharge Note   Patient Details  Name: Jacqueline Bonilla MRN: [id number] Date of Birth: 1955/04/09  [facility] CM/SW Contact:  Baldemar Lenis, LCSW Phone Number: 04/01/2023, 10:28 AM   Clinical Narrative:   CSW updated by Rehab Admissions that patient received approval for CIR, bed is available today. No further TOC needs.    Final next level of care: IP Rehab Facility Barriers to Discharge: Barriers Resolved   Patient Goals and CMS Choice   CMS Medicare.gov Compare Post Acute Care list provided to:: Patient Represenative (must comment) Choice offered to / list presented to : Spouse [place] ownership interest in [facility].provided to:: Spouse    Discharge Placement                       Discharge Plan and Services Additional resources added to the After Visit Summary for       Post Acute Care Choice: IP Rehab                               Social Drivers of Health (SDOH) Interventions SDOH Screenings   Food Insecurity: No Food Insecurity (03/27/2023)  Housing: Low Risk  (03/27/2023)  Transportation Needs: No Transportation Needs (03/27/2023)  Utilities: Not At Risk (03/27/2023)  Alcohol Screen: Low Risk  (10/23/2022)  Depression (PHQ2-9): Low Risk  (02/11/2023)  Financial Resource Strain: Low Risk  (10/23/2022)  Physical Activity: Sufficiently Active (10/23/2022)  Social Connections: Moderately Isolated (03/27/2023)  Stress: No Stress Concern Present (08/28/2021)  Tobacco Use: Low Risk  (03/27/2023)  Health Literacy: Inadequate Health Literacy (10/23/2022)     Readmission Risk Interventions     No data to display

## 2023-04-01 NOTE — Progress Notes (Signed)
 PMR Admission Coordinator Pre-Admission Assessment   Patient: Jacqueline Bonilla is an 68 y.o., female MRN: [id number] DOB: 12-Jul-1955 Height: 5\' 4"  (162.6 cm) Weight: 51.9 kg                                                                                                                                               Insurance Information HMO: yes    PPO:      PCP:      IPA:      80/20:      OTHER:  PRIMARY: [employer] HMO      Policy#: [id number]; Medicare [id number]      Subscriber: pt CM Name: Michaelle Copas      Phone#: [phone] ext [phone]    Fax#: [phone] Pre-Cert#: [id number] initial denial on 3/28 and appealed 3/28  approved for 5 days 03/29/23 until 04/04/23. F/u with Pola Corn phone [phone] x [phone] fax [phone]        Benefits:  Phone #: [phone]     Name: 3/27 Eff. Date: 01/02/23     Deduct: none      Out of Pocket Max: [phone]      Life Max: none  CIR: $399 co pay per day days 1 until 7 ( $2793)      SNF: $10 co pay per day days 1 until 20; $214 co pay per day days 21 until 100 Outpatient: $25 per visit     Co-Pay:  Home Health: 100%      Co-Pay:  DME: 80%     Co-Pay: 20% Providers: in network  SECONDARY: none   Financial Counselor:       Phone#:    The Data processing manager" for patients in Inpatient Rehabilitation Facilities with attached "Privacy Act Statement-Health Care Records" was provided and verbally reviewed with: Patient and Family   Emergency Contact Information Contact Information       Name Relation Home Work Mobile    Clair,[name] Spouse     [phone]         Other Contacts   None on File      Current Medical History  Patient Admitting Diagnosis: CVA   History of Present Illness: Jacqueline Bonilla is a 68 y.o. female with PMHx of anemia, Anxiety, Depression, Dysmenorrhea, Endometriosis, Fibroid, Hypertension, Implantable loop recorder present (2021), Microhematuria, Osteoporosis, Tachycardia, and  Thrombocytopenia . She was admitted to [facility] on 03/27/2023 for increased confusion, balance deficits, and difficulty speaking.  Of note, she has a history of prior stroke left MCA status post thrombectomy in 2022, and has been taken off of Brilinta in February 2025. Admission chemistries unremarkable, urine drug screen positive marijuana. Echocardiogram ejection fraction of 60 to 65% no wall motion abnormalities grade 1 diastolic dysfunction. Therapy evaluations completed and due to patient's decreased functional mobility was admitted for a comprehensive rehab  program.    CT head negative on intake, MRI showed evolving infarct in the left hemisphere and the patient was given TNK and admitted by neurology for acute CVA.   Per chart review, patient lives with her spouse in a house with a single level and 5 steps to enter.  She is independent of mobility and ADLs at baseline.  Currently, she is mod assist for lower body dressing, supervision for bed mobility, contact-guard assist for transfers, and can walk min assist 300 feet with a single person hand-held assist.  SLP performed an evaluation and found difficulties with sustained attention, awareness, short-term memory, problem-solving and executive function as well as some mild residual aphasia; unsure how much is baseline given prior cognitive deficits, her husband feels she is about at her baseline with memory deficits.  Per review of neuro psych evaluation 05/06/2020, patient was fully dependent on husband for complex tasks such as driving and finances, most instrumental test such as cooking, chores, and grocery shopping; patient's husband confirms that this is still the case.  OT eval pending.  She has been to inpatient rehab for prior strokes July 2022 and December 2022, follows with Dr. Wynn Banker.  Since then, she has had progressive frequency of falls at home, as well as decreased confidence in ambulation such that she is increasingly dependent  on her husband.  This escalated significantly about 6 weeks ago, when they had to come home from a family trip early because she had 4 falls within a few days at the daughter's home.   Complete NIHSS TOTAL: 4 Glasgow Coma Scale Score: 15   Patient's medical record from [facility] has been reviewed by the rehabilitation admission coordinator and physician.   Past Medical History      Past Medical History:  Diagnosis Date   Anemia     Anxiety     Depression     Dysmenorrhea     Endometriosis     Fibroid     Hypertension     Implantable loop recorder present 2021   Microhematuria      negative workup   Osteoporosis     Tachycardia     Thrombocytopenia (HCC)          Has the patient had major surgery during 100 days prior to admission? No   Family History  family history includes Cancer in her father; Dementia in her mother; Heart attack in her maternal grandfather; Polymyositis in her sister.   Current Medications   Current Medications    Current Facility-Administered Medications:    acetaminophen (TYLENOL) tablet 650 mg, 650 mg, Oral, Q4H PRN **OR** acetaminophen (TYLENOL) 160 MG/5ML solution 650 mg, 650 mg, Per Tube, Q4H PRN **OR** acetaminophen (TYLENOL) suppository 650 mg, 650 mg, Rectal, Q4H PRN, Pamalee Leyden, Devon, NP   aspirin chewable tablet 81 mg, 81 mg, Oral, Daily, Pearlean Brownie, Pramod S, MD, 81 mg at 03/29/23 1610   atorvastatin (LIPITOR) tablet 40 mg, 40 mg, Oral, Daily, Pearlean Brownie, Pramod S, MD, 40 mg at 03/29/23 9604   Chlorhexidine Gluconate Cloth 2 % PADS 6 each, 6 each, Topical, Q0600, Micki Riley, MD, 6 each at 03/29/23 0708   Oral care mouth rinse, 15 mL, Mouth Rinse, PRN, Micki Riley, MD   senna-docusate (Senokot-S) tablet 1 tablet, 1 tablet, Oral, QHS PRN, Elmer Picker, NP   sodium chloride 0.9 % bolus 500 mL, 500 mL, Intravenous, Once **FOLLOWED BY** [DISCONTINUED] 0.9 %  sodium chloride infusion, 100 mL/hr, Intravenous, Continuous, Linwood Dibbles,  MD    sodium chloride flush (NS) 0.9 % injection 3 mL, 3 mL, Intravenous, Once, Linwood Dibbles, MD   Study - LIBREXIA-STROKE - milvexian 25 mg or placebo tablet (PI-Sethi), 1 tablet, Oral, BID, Micki Riley, MD, 1 tablet at 03/29/23 1610   ticagrelor (BRILINTA) tablet 90 mg, 90 mg, Oral, BID, Micki Riley, MD, 90 mg at 03/29/23 9604     Patients Current Diet:  Diet Order                  Diet Heart Room service appropriate? Yes with Assist; Fluid consistency: Thin  Diet effective now                       Precautions / Restrictions Precautions Precautions: Fall Restrictions Weight Bearing Restrictions Per Provider Order: No    Has the patient had 2 or more falls or a fall with injury in the past year?Yes   Prior Activity Level Limited Community (1-2x/wk): supervision with all adls per spouse   Prior Functional Level Prior Function Prior Level of Function : Patient poor historian/Family not available Mobility Comments: Pt reports she enjoys walking at baseline ADLs Comments: spouse provides supervision for all adls per spouse in the past 4 years   Self Care: Did the patient need help bathing, dressing, using the toilet or eating?  Needed some help   Indoor Mobility: Did the patient need assistance with walking from room to room (with or without device)? Independent   Stairs: Did the patient need assistance with internal or external stairs (with or without device)? Independent   Functional Cognition: Did the patient need help planning regular tasks such as shopping or remembering to take medications? Needed some help   Patient Information Are you of Hispanic, Latino/a,or Spanish origin?: A. No, not of Hispanic, Latino/a, or Spanish origin What is your race?: A. White Do you need or want an interpreter to communicate with a doctor or health care staff?: 0. No   Patient's Response To:  Health Literacy and Transportation Is the patient able to respond to health literacy and  transportation needs?: Yes Health Literacy - How often do you need to have someone help you when you read instructions, pamphlets, or other written material from your doctor or pharmacy?: Sometimes In the past 12 months, has lack of transportation kept you from medical appointments or from getting medications?: No In the past 12 months, has lack of transportation kept you from meetings, work, or from getting things needed for daily living?: No   Journalist, newspaper / Equipment Home Equipment:  (pt unable to provide)   Prior Device Use: Indicate devices/aids used by the patient prior to current illness, exacerbation or injury? None of the above   Current Functional Level Cognition   Arousal/Alertness: Awake/alert Overall Cognitive Status: History of cognitive impairments - at baseline Orientation Level: Oriented X4 Attention: Sustained Sustained Attention: Impaired Sustained Attention Impairment: Verbal basic Memory: Impaired Memory Impairment: Decreased recall of new information, Retrieval deficit Awareness: Impaired Awareness Impairment: Intellectual impairment Problem Solving: Impaired Problem Solving Impairment: Functional complex Executive Function: Sequencing, Organizing Sequencing: Impaired Sequencing Impairment: Functional basic Organizing: Impaired Organizing Impairment: Functional basic Safety/Judgment: Appears intact    Extremity Assessment (includes Sensation/Coordination)   Upper Extremity Assessment: Right hand dominant, RUE deficits/detail RUE Deficits / Details: Grossly 4/5 MMT RUE Sensation: WNL RUE Coordination: decreased fine motor, decreased gross motor  Lower Extremity Assessment: Defer to PT evaluation RLE Deficits / Details: Strength  5/5 RLE Sensation: WNL RLE Coordination: decreased gross motor LLE Deficits / Details: Strength 5/5 LLE Sensation: WNL     ADLs   Overall ADL's : Needs assistance/impaired Eating/Feeding: Minimal assistance,  Sitting Grooming: Minimal assistance, Standing Upper Body Bathing: Minimal assistance, Sitting Lower Body Bathing: Moderate assistance, Sit to/from stand Upper Body Dressing : Moderate assistance, Sitting Lower Body Dressing: Moderate assistance, Sit to/from stand Toilet Transfer: Minimal assistance, Regular Toilet, Ambulation     Mobility   Overal bed mobility: Needs Assistance Bed Mobility: Supine to Sit Supine to sit: Supervision General bed mobility comments: up in recliner on arrival and departure.     Transfers   Overall transfer level: Needs assistance Equipment used: None Transfers: Sit to/from Stand Sit to Stand: Contact guard assist Bed to/from chair/wheelchair/BSC transfer type:: Step pivot Step pivot transfers: Min assist General transfer comment: Steadying assist to rise to stand from recliner     Ambulation / Gait / Stairs / Wheelchair Mobility   Ambulation/Gait Ambulation/Gait assistance: Min assist, Contact guard assist Gait Distance (Feet): 300 Feet Assistive device: 1 person hand held assist Gait Pattern/deviations: Step-through pattern, Decreased stride length, Decreased dorsiflexion - right, Decreased dorsiflexion - left, Shuffle, Narrow base of support General Gait Details: Decreased bilateral foot clearance and heel strike at initial contact. Consistent minA for balance and cues for environmental navigation. Decreased gait speed. Gait velocity: decreased Gait velocity interpretation: 1.31 - 2.62 ft/sec, indicative of limited community ambulator Stairs: Yes Stairs assistance: Contact guard assist Stair Management: One rail Right, Forwards, Backwards Number of Stairs: 1 General stair comments: HHA     Posture / Balance Balance Overall balance assessment: Needs assistance Sitting-balance support: Feet supported Sitting balance-Leahy Scale: Good Standing balance support: Single extremity supported, No upper extremity supported Standing balance-Leahy Scale:  Poor Standing balance comment: External support by therapist Standardized Balance Assessment Standardized Balance Assessment : Dynamic Gait Index Dynamic Gait Index Level Surface: Mild Impairment Change in Gait Speed: Moderate Impairment Gait with Horizontal Head Turns: Moderate Impairment Gait with Vertical Head Turns: Severe Impairment Gait and Pivot Turn: Severe Impairment Step Over Obstacle: Mild Impairment Step Around Obstacles: Mild Impairment Steps: Moderate Impairment Total Score: 9     Special needs/care consideration Fall precautions Memory deficits at baseline    Previous Home Environment  Living Arrangements: Spouse/significant other  Lives With: Spouse Available Help at Discharge: Family, Available 24 hours/day (spouse providing 24/7 supervision for the past 4 years) Type of Home: House Home Layout: One level Home Access: Stairs to enter Entrance Stairs-Rails: Right Entrance Stairs-Number of Steps: 5 Bathroom Shower/Tub: Health visitor: Handicapped height Bathroom Accessibility: Yes How Accessible: Accessible via walker Home Care Services: No Additional Comments: Unsure of accuracy of home set up   Discharge Living Setting Plans for Discharge Living Setting: Patient's home, House Type of Home at Discharge: House Discharge Home Layout: One level Discharge Home Access: Stairs to enter Entrance Stairs-Rails: Right Entrance Stairs-Number of Steps: 5 Discharge Bathroom Shower/Tub: Walk-in shower Discharge Bathroom Toilet: Handicapped height Discharge Bathroom Accessibility: Yes How Accessible: Accessible via walker Does the patient have any problems obtaining your medications?: No   Social/Family/Support Systems Patient Roles: Spouse Contact Information: spouse Anticipated Caregiver: spouse Anticipated Caregiver's Contact Information: see contacts Ability/Limitations of Caregiver: caregiver for her for 4 years Caregiver Availability:  24/7 Discharge Plan Discussed with Primary Caregiver: Yes Is Caregiver In Agreement with Plan?: Yes Does Caregiver/Family have Issues with Lodging/Transportation while Pt is in Rehab?: No   Goals Patient/Family Goal for Rehab:  supervision with PT, OT and SLP Expected length of stay: ELOS 7 to 10 days Additional Information: CIR twice in 2022 Pt/Family Agrees to Admission and willing to participate: Yes Program Orientation Provided & Reviewed with Pt/Caregiver Including Roles  & Responsibilities: Yes   Decrease burden of Care through IP rehab admission: n/a   Possible need for SNF placement upon discharge:not anticipated   Patient Condition: This patient's condition remains as documented in the consult dated 03/28/23, in which the Rehabilitation Physician determined and documented that the patient's condition is appropriate for intensive rehabilitative care in an inpatient rehabilitation facility. Will admit to inpatient rehab today.   Preadmission Screen Completed By:  Clois Dupes, RN MSN 03/29/2023 4:45 PM, updates provided by Lissa Merlin, PT 04/01/23 ______________________________________________________________________   Discussed status with Dr. Riley Kill on 04/01/23 at 9:00 am and received approval for admission today.   Admission Coordinator:  Clois Dupes, RN MSN, updates provided by Lissa Merlin, PT time 9:28 am/Date 04/01/23

## 2023-04-02 ENCOUNTER — Telehealth: Payer: Self-pay | Admitting: Physical Medicine & Rehabilitation

## 2023-04-02 ENCOUNTER — Other Ambulatory Visit (HOSPITAL_COMMUNITY): Payer: Self-pay

## 2023-04-02 ENCOUNTER — Telehealth (HOSPITAL_COMMUNITY): Payer: Self-pay | Admitting: Pharmacy Technician

## 2023-04-02 DIAGNOSIS — I63512 Cerebral infarction due to unspecified occlusion or stenosis of left middle cerebral artery: Secondary | ICD-10-CM

## 2023-04-02 DIAGNOSIS — I69993 Ataxia following unspecified cerebrovascular disease: Secondary | ICD-10-CM | POA: Diagnosis not present

## 2023-04-02 LAB — CBC WITH DIFFERENTIAL/PLATELET
Abs Immature Granulocytes: 0.02 10*3/uL (ref 0.00–0.07)
Basophils Absolute: 0.1 10*3/uL (ref 0.0–0.1)
Basophils Relative: 1 %
Eosinophils Absolute: 0.2 10*3/uL (ref 0.0–0.5)
Eosinophils Relative: 5 %
HCT: 36.3 % (ref 36.0–46.0)
Hemoglobin: 12 g/dL (ref 12.0–15.0)
Immature Granulocytes: 1 %
Lymphocytes Relative: 24 %
Lymphs Abs: 1 10*3/uL (ref 0.7–4.0)
MCH: 29.3 pg (ref 26.0–34.0)
MCHC: 33.1 g/dL (ref 30.0–36.0)
MCV: 88.8 fL (ref 80.0–100.0)
Monocytes Absolute: 0.5 10*3/uL (ref 0.1–1.0)
Monocytes Relative: 12 %
Neutro Abs: 2.4 10*3/uL (ref 1.7–7.7)
Neutrophils Relative %: 57 %
Platelets: 226 10*3/uL (ref 150–400)
RBC: 4.09 MIL/uL (ref 3.87–5.11)
RDW: 13.9 % (ref 11.5–15.5)
WBC: 4 10*3/uL (ref 4.0–10.5)
nRBC: 0 % (ref 0.0–0.2)

## 2023-04-02 LAB — COMPREHENSIVE METABOLIC PANEL WITH GFR
ALT: 17 U/L (ref 0–44)
AST: 18 U/L (ref 15–41)
Albumin: 3.4 g/dL — ABNORMAL LOW (ref 3.5–5.0)
Alkaline Phosphatase: 46 U/L (ref 38–126)
Anion gap: 10 (ref 5–15)
BUN: 17 mg/dL (ref 8–23)
CO2: 24 mmol/L (ref 22–32)
Calcium: 9.4 mg/dL (ref 8.9–10.3)
Chloride: 104 mmol/L (ref 98–111)
Creatinine, Ser: 1.05 mg/dL — ABNORMAL HIGH (ref 0.44–1.00)
GFR, Estimated: 58 mL/min — ABNORMAL LOW (ref >60)
Glucose, Bld: 95 mg/dL (ref 70–99)
Potassium: 4 mmol/L (ref 3.5–5.1)
Sodium: 138 mmol/L (ref 135–145)
Total Bilirubin: 0.6 mg/dL (ref 0.0–1.2)
Total Protein: 7.2 g/dL (ref 6.5–8.1)

## 2023-04-02 NOTE — Plan of Care (Signed)
  Problem: RH Balance Goal: LTG Patient will maintain dynamic standing with ADLs (OT) Description: LTG:  Patient will maintain dynamic standing balance with assist during activities of daily living (OT)  Flowsheets (Taken 04/02/2023 1256) LTG: Pt will maintain dynamic standing balance during ADLs with: Supervision/Verbal cueing   Problem: RH Eating Goal: LTG Patient will perform eating w/assist, cues/equip (OT) Description: LTG: Patient will perform eating with assist, with/without cues using equipment (OT) Flowsheets (Taken 04/02/2023 1256) LTG: Pt will perform eating with assistance level of: Independent   Problem: RH Grooming Goal: LTG Patient will perform grooming w/assist,cues/equip (OT) Description: LTG: Patient will perform grooming with assist, with/without cues using equipment (OT) Flowsheets (Taken 04/02/2023 1256) LTG: Pt will perform grooming with assistance level of: Set up assist    Problem: RH Bathing Goal: LTG Patient will bathe all body parts with assist levels (OT) Description: LTG: Patient will bathe all body parts with assist levels (OT) Flowsheets (Taken 04/02/2023 1256) LTG: Pt will perform bathing with assistance level/cueing: Supervision/Verbal cueing   Problem: RH Dressing Goal: LTG Patient will perform upper body dressing (OT) Description: LTG Patient will perform upper body dressing with assist, with/without cues (OT). Flowsheets (Taken 04/02/2023 1256) LTG: Pt will perform upper body dressing with assistance level of: Supervision/Verbal cueing Goal: LTG Patient will perform lower body dressing w/assist (OT) Description: LTG: Patient will perform lower body dressing with assist, with/without cues in positioning using equipment (OT) Flowsheets (Taken 04/02/2023 1256) LTG: Pt will perform lower body dressing with assistance level of: Supervision/Verbal cueing   Problem: RH Toileting Goal: LTG Patient will perform toileting task (3/3 steps) with assistance level  (OT) Description: LTG: Patient will perform toileting task (3/3 steps) with assistance level (OT)  Flowsheets (Taken 04/02/2023 1256) LTG: Pt will perform toileting task (3/3 steps) with assistance level: Supervision/Verbal cueing   Problem: RH Functional Use of Upper Extremity Goal: LTG Patient will use RT/LT upper extremity as a (OT) Description: LTG: Patient will use right/left upper extremity as a stabilizer/gross assist/diminished/nondominant/dominant level with assist, with/without cues during functional activity (OT) Flowsheets (Taken 04/02/2023 1256) LTG: Use of upper extremity in functional activities: RUE as gross assist level LTG: Pt will use upper extremity in functional activity with assistance level of: Supervision/Verbal cueing   Problem: RH Toilet Transfers Goal: LTG Patient will perform toilet transfers w/assist (OT) Description: LTG: Patient will perform toilet transfers with assist, with/without cues using equipment (OT) Flowsheets (Taken 04/02/2023 1256) LTG: Pt will perform toilet transfers with assistance level of: Supervision/Verbal cueing   Problem: RH Tub/Shower Transfers Goal: LTG Patient will perform tub/shower transfers w/assist (OT) Description: LTG: Patient will perform tub/shower transfers with assist, with/without cues using equipment (OT) Flowsheets (Taken 04/02/2023 1256) LTG: Pt will perform tub/shower stall transfers with assistance level of: Supervision/Verbal cueing LTG: Pt will perform tub/shower transfers from: Walk in shower   Problem: RH Awareness Goal: LTG: Patient will demonstrate awareness during functional activites type of (OT) Description: LTG: Patient will demonstrate awareness during functional activites type of (OT) Flowsheets (Taken 04/02/2023 1256) Patient will demonstrate awareness during functional activites type of: Emergent LTG: Patient will demonstrate awareness during functional activites type of (OT): Supervision

## 2023-04-02 NOTE — Telephone Encounter (Signed)
 Patient's husband was told her need permission from Dr. Wynn Banker to take patient outside.  She is still in the hospital.  Please call him at [phone].

## 2023-04-02 NOTE — Evaluation (Signed)
 Occupational Therapy Assessment and Plan  Patient Details  Name: Jacqueline Bonilla MRN: [id number] Date of Birth: 01/20/1955  OT Diagnosis: apraxia, ataxia, cognitive deficits, disturbance of vision, and hemiplegia affecting dominant side Rehab Potential: Rehab Potential (ACUTE ONLY): Good ELOS: 8-10 days   Today's Date: 04/02/2023 OT Individual Time: 1045-1200 OT Individual Time Calculation (min): 75 min     Hospital Problem: Active Problems:   Left middle cerebral artery stroke [facility])   Past Medical History:  Past Medical History:  Diagnosis Date   Anemia    Anxiety    Depression    Dysmenorrhea    Endometriosis    Fibroid    Hypertension    Implantable loop recorder present 2021   Microhematuria    negative workup   Osteoporosis    Tachycardia    Thrombocytopenia (HCC)    Past Surgical History:  Past Surgical History:  Procedure Laterality Date   BREAST BIOPSY     BUBBLE STUDY  12/08/2020   Procedure: BUBBLE STUDY;  Surgeon: Wendall Stade, MD;  Location: [facility] ENDOSCOPY;  Service: Cardiovascular;;   CESAREAN SECTION     hysteroscopic resection     implantable loop recorder implant  10/21/2019   Medtronic Reveal Linq model LNQ 22 ([place] [id number]) implantable loop recorder   IR CT HEAD LTD  12/01/2020   IR PERCUTANEOUS ART THROMBECTOMY/INFUSION INTRACRANIAL INC DIAG ANGIO  12/01/2020   IR US GUIDE VASC ACCESS RIGHT  12/01/2020   ORIF HUMERUS FRACTURE Left 07/31/2021   Procedure: OPEN REDUCTION INTERNAL FIXATION (ORIF) DISTAL HUMERUS FRACTURE;  Surgeon: Roby Lofts, MD;  Location: [facility] OR;  Service: Orthopedics;  Laterality: Left;   RADIOLOGY WITH ANESTHESIA N/A 12/01/2020   Procedure: IR WITH ANESTHESIA - CODE STROKE;  Surgeon: Radiologist, Medication, MD;  Location: [facility] OR;  Service: Radiology;  Laterality: N/A;   TEE WITHOUT CARDIOVERSION N/A 12/08/2020   Procedure: TRANSESOPHAGEAL ECHOCARDIOGRAM (TEE);  Surgeon: Wendall Stade, MD;  Location: [facility] ENDOSCOPY;  Service:  Cardiovascular;  Laterality: N/A;    Assessment & Plan Clinical Impression: .Jacqueline Bonilla. Jacqueline Bonilla is a 68 year old right-handed female with history of anxiety/depression, hypertension, thrombocytopenia, left MCA infarction December 2022 as well as mechanical thrombectomy after receiving TNK for left M2/M3 occlusion complicated by minor subarachnoid hemorrhage.  She did have a loop recorder placed at that time and maintained on aspirin.  She was taken off of Brilinta in February 2025.  Patient received received inpatient rehab services 12/08/2020 - 12/17/2020 and was discharged to home contact-guard for ambulation.  Per chart review patient lives with spouse.  1 level home 5 steps to entry.  Presented 03/27/2023 with aphasia, altered mental status and unstable gait with frequent falls.  CT/MRI showed small acute left MCA infarction.  Severe chronic small vessel ischemic disease and cerebral atrophy.  CTA showed no large vessel occlusion.She did receive TNK.  Admission chemistries unremarkable, urine drug screen positive marijuana.  Echocardiogram ejection fraction of 60 to 65% no wall motion abnormalities grade 1 diastolic dysfunction.  Neurology follow-up currently maintained on aspirin as well as Brilinta 90 mg twice daily with study drug followed by neurology services.  Tolerating a regular consistency diet.  Therapy evaluations completed and due to patient's decreased functional mobility was admitted for a comprehensive rehab program.    Patient transferred to CIR on 04/01/2023 .    Patient currently requires min with basic self-care skills secondary to impaired timing and sequencing, abnormal tone, unbalanced muscle activation, motor apraxia, ataxia, and decreased coordination, decreased  visual acuity, decreased visual perceptual skills, decreased visual motor skills, and field cut, decreased motor planning and ideational apraxia, decreased problem solving, decreased memory, and delayed processing, and  decreased standing balance, decreased postural control, hemiplegia, and decreased balance strategies.  Prior to hospitalization, patient could complete self care  with independent .  Patient will benefit from skilled intervention to increase independence with basic self-care skills prior to discharge home with care partner.  Anticipate patient will require 24 hour supervision and follow up home health.  OT - End of Session Activity Tolerance: Tolerates 10 - 20 min activity with multiple rests OT Assessment Rehab Potential (ACUTE ONLY): Good OT Patient demonstrates impairments in the following area(s): Balance;Cognition;Motor;Vision;Perception OT Basic ADL's Functional Problem(s): Bathing;Dressing;Toileting;Grooming;Eating OT Transfers Functional Problem(s): Toilet;Tub/Shower OT Additional Impairment(s): Fuctional Use of Upper Extremity OT Plan OT Intensity: Minimum of 1-2 x/day, 45 to 90 minutes OT Frequency: 5 out of 7 days OT Duration/Estimated Length of Stay: 8-10 days OT Treatment/Interventions: Balance/vestibular training;Cognitive remediation/compensation;DME/adaptive equipment instruction;Discharge planning;Functional mobility training;Neuromuscular re-education;Psychosocial support;Patient/family education;Self Care/advanced ADL retraining;UE/LE Strength taining/ROM;Therapeutic Exercise;Therapeutic Activities;UE/LE Coordination activities;Visual/perceptual remediation/compensation OT Self Feeding Anticipated Outcome(s): independent OT Basic Self-Care Anticipated Outcome(s): supervision with bathing and dressing OT Toileting Anticipated Outcome(s): supervision OT Bathroom Transfers Anticipated Outcome(s): supervision OT Recommendation Patient destination: Home Follow Up Recommendations: Home health OT Equipment Recommended: To be determined   OT Evaluation Precautions/Restrictions  Precautions Precautions: Fall Precaution/Restrictions Comments: R hemi Restrictions Weight  Bearing Restrictions Per Provider Order: No   Pain Pain Assessment Pain Score: 0-No pain Home Living/Prior Functioning Home Living Family/patient expects to be discharged to:: Private residence Living Arrangements: Spouse/significant other Available Help at Discharge: Family, Available 24 hours/day Type of Home: House Home Access: Stairs to enter Secretary/administrator of Steps: 3 Entrance Stairs-Rails: Right Home Layout: One level Bathroom Shower/Tub: Health visitor: Handicapped height Bathroom Accessibility: Yes  Lives With: Spouse Prior Function Level of Independence: Independent with basic ADLs, Independent with homemaking with ambulation, Independent with gait, Independent with transfers  Able to Take Stairs?: Yes Driving: Yes (thinks she was just starting to practice driving again) Vision Baseline Vision/History: 1 Wears glasses (distance) Ability to See in Adequate Light: 1 Impaired Patient Visual Report: Peripheral vision impairment (pt states that she cannot see things to her R side, however runs into things on the L side.) Vision Assessment?: Yes Eye Alignment: Within Functional Limits Ocular Range of Motion: Restricted looking up;Impaired-to be further tested in functional context Alignment/Gaze Preference: Within Defined Limits Tracking/Visual Pursuits: Decreased smoothness of horizontal tracking;Decreased smoothness of vertical tracking;Requires cues, head turns, or add eye shifts to track;Unable to hold eye position out of midline Saccades: Additional eye shifts occurred during testing;Additional head turns occurred during testing;Decreased speed of saccadic movement Convergence: Impaired (comment) (unable to bring eyes together past midline) Visual Fields: Impaired-to be further tested in functional context (pt states and indicates that she cannot see to R periphery, however runs into things on L side during ambulation and lifts R side of RW during  ambulation) Depth Perception: Undershoots Additional Comments: undershoots initially but improves during session Perception  Perception: Impaired Perception-Other Comments: bumps into obstacles in L periphery Praxis Praxis Impairment Details: Limb apraxia (mild RUE limb apraxia) Cognition Cognition Overall Cognitive Status: History of cognitive impairments - at baseline (from previous stroke) Arousal/Alertness: Awake/alert Orientation Level: Place;Situation;Person Person: Oriented Place: Oriented Situation: Oriented Memory: Impaired Memory Impairment: Decreased recall of new information;Retrieval deficit Attention: Sustained Sustained Attention: Impaired Awareness: Impaired Problem Solving: Impaired Safety/Judgment: Impaired Brief Interview  for Mental Status (BIMS) Repetition of Three Words (First Attempt): 3 Temporal Orientation: Year: Correct Temporal Orientation: Month: Accurate within 5 days Temporal Orientation: Day: Correct Recall: "Sock": Yes, no cue required Recall: "Blue": Yes, no cue required Recall: "Bed": Yes, no cue required BIMS Summary Score: 15 Sensation Sensation Light Touch: Appears Intact Hot/Cold: Appears Intact Proprioception: Impaired by gross assessment Stereognosis: Impaired by gross assessment Additional Comments: impaired in RUE Coordination Gross Motor Movements are Fluid and Coordinated: No Fine Motor Movements are Fluid and Coordinated: No Coordination and Movement Description: limited coordination and use of RUE in functional tasks Finger Nose Finger Test: in both UEs, pt able to reach target with mod cues on how to do the test, demonstrates tremors Heel Shin Test: Slower on R than L Motor  Motor Motor: Hemiplegia;Abnormal tone;Motor apraxia;Ataxia Motor - Skilled Clinical Observations: per pt report, her RUE is weaker than previously. She is unable to write or hold pen correctly.  Trunk/Postural Assessment  Postural Control Postural  Control: Deficits on evaluation Trunk Control: impaired during gait and needs support at waist or hand held assist to support balance  Balance Static Standing Balance Static Standing - Level of Assistance: 4: Min assist Dynamic Standing Balance Dynamic Standing - Level of Assistance: 3: Mod assist Extremity/Trunk Assessment RUE Assessment RUE Assessment: Exceptions to [facility] Active Range of Motion (AROM) Comments: full AROM except for wrist extension only to neutral due to prior wrist fracture General Strength Comments: 3+/5 overall RUE Body System: Neuro Brunstrum levels for arm and hand: Arm;Hand Brunstrum level for arm: Stage V Relative Independence from Synergy Brunstrum level for hand: Stage III Synergies performed voluntarily RUE Tone RUE Tone: Mild RUE Tone Comments: increased elbow flexor tone LUE Assessment LUE Assessment: Within Functional Limits  Care Tool Care Tool Self Care Eating   Eating Assist Level: Set up assist    Oral Care    Oral Care Assist Level: Supervision/Verbal cueing    Bathing   Body parts bathed by patient: Right arm;Left arm;Chest;Abdomen;Front perineal area;Buttocks;Right upper leg;Face;Left lower leg;Right lower leg;Left upper leg     Assist Level: Contact Guard/Touching assist    Upper Body Dressing(including orthotics)   What is the patient wearing?: Bra;Pull over shirt   Assist Level: Minimal Assistance - Patient > 75%    Lower Body Dressing (excluding footwear)   What is the patient wearing?: Underwear/pull up;Pants Assist for lower body dressing: Contact Guard/Touching assist    Putting on/Taking off footwear   What is the patient wearing?: Non-skid slipper socks;Socks Assist for footwear: Minimal Assistance - Patient > 75%       Care Tool Toileting Toileting activity   Assist for toileting: Minimal Assistance - Patient > 75%     Care Tool Bed Mobility Roll left and right activity   Roll left and right assist level:  Supervision/Verbal cueing    Sit to lying activity   Sit to lying assist level: Supervision/Verbal cueing    Lying to sitting on side of bed activity   Lying to sitting on side of bed assist level: the ability to move from lying on the back to sitting on the side of the bed with no back support.: Supervision/Verbal cueing     Care Tool Transfers Sit to stand transfer   Sit to stand assist level: Supervision/Verbal cueing    Chair/bed transfer   Chair/bed transfer assist level: Minimal Assistance - Patient > 75%     Toilet transfer   Assist Level: Minimal Assistance - Patient >  75%     Care Tool Cognition  Expression of Ideas and Wants Expression of Ideas and Wants: 3. Some difficulty - exhibits some difficulty with expressing needs and ideas (e.g, some words or finishing thoughts) or speech is not clear  Understanding Verbal and Non-Verbal Content Understanding Verbal and Non-Verbal Content: 3. Usually understands - understands most conversations, but misses some part/intent of message. Requires cues at times to understand   Memory/Recall Ability Memory/Recall Ability : Current season;That he or she is in a hospital/hospital unit   Refer to Care Plan for Long Term Goals  SHORT TERM GOAL WEEK 1 OT Short Term Goal 1 (Week 1): STGs = LTGs  Recommendations for other services: None    Skilled Therapeutic Intervention ADL ADL Eating: Set up Grooming: Supervision/safety Upper Body Bathing: Supervision/safety;Moderate cueing Where Assessed-Upper Body Bathing: Shower Lower Body Bathing: Minimal assistance;Moderate cueing Where Assessed-Lower Body Bathing: Shower Upper Body Dressing: Minimal assistance;Moderate cueing Where Assessed-Upper Body Dressing: Edge of bed Lower Body Dressing: Minimal assistance;Moderate cueing Where Assessed-Lower Body Dressing: Edge of bed Toileting: Minimal assistance Where Assessed-Toileting: Teacher, adult education: Company secretary Method: Proofreader: Acupuncturist: Insurance underwriter Method: Designer, industrial/product: Grab bars;Shower seat with back   Pt seen for initial evaluation and ADL training with a focus on balance, perceptual awareness, and processing skills. Pt's spouse present for first part of session.   Reviewed role of OT, discussed POC and pt's goals, and ELOS.  Pt completed self care, see ADL documentation above.    Pt resting in w/c with all needs met and belt alarm set.     Discharge Criteria: Patient will be discharged from OT if patient refuses treatment 3 consecutive times without medical reason, if treatment goals not met, if there is a change in medical status, if patient makes no progress towards goals or if patient is discharged from hospital.  The above assessment, treatment plan, treatment alternatives and goals were discussed and mutually agreed upon: by patient and by family  Paislynn Hegstrom 04/02/2023, 12:50 PM

## 2023-04-02 NOTE — Evaluation (Signed)
 Speech Language Pathology Assessment and Plan  Patient Details  Name: Jacqueline Bonilla MRN: [id number] Date of Birth: 1955-10-14  SLP Diagnosis: Cognitive Impairments;Dysphagia  Rehab Potential: Good ELOS: 7-10    Today's Date: 04/02/2023 SLP Individual Time: 1300-1400 SLP Individual Time Calculation (min): 60 min   Hospital Problem: Active Problems:   Left middle cerebral artery stroke [facility])  Past Medical History:  Past Medical History:  Diagnosis Date   Anemia    Anxiety    Depression    Dysmenorrhea    Endometriosis    Fibroid    Hypertension    Implantable loop recorder present 2021   Microhematuria    negative workup   Osteoporosis    Tachycardia    Thrombocytopenia (HCC)    Past Surgical History:  Past Surgical History:  Procedure Laterality Date   BREAST BIOPSY     BUBBLE STUDY  12/08/2020   Procedure: BUBBLE STUDY;  Surgeon: Wendall Stade, MD;  Location: [facility] ENDOSCOPY;  Service: Cardiovascular;;   CESAREAN SECTION     hysteroscopic resection     implantable loop recorder implant  10/21/2019   Medtronic Reveal Linq model LNQ 22 ([place] [id number]) implantable loop recorder   IR CT HEAD LTD  12/01/2020   IR PERCUTANEOUS ART THROMBECTOMY/INFUSION INTRACRANIAL INC DIAG ANGIO  12/01/2020   IR US GUIDE VASC ACCESS RIGHT  12/01/2020   ORIF HUMERUS FRACTURE Left 07/31/2021   Procedure: OPEN REDUCTION INTERNAL FIXATION (ORIF) DISTAL HUMERUS FRACTURE;  Surgeon: Roby Lofts, MD;  Location: [facility] OR;  Service: Orthopedics;  Laterality: Left;   RADIOLOGY WITH ANESTHESIA N/A 12/01/2020   Procedure: IR WITH ANESTHESIA - CODE STROKE;  Surgeon: Radiologist, Medication, MD;  Location: [facility] OR;  Service: Radiology;  Laterality: N/A;   TEE WITHOUT CARDIOVERSION N/A 12/08/2020   Procedure: TRANSESOPHAGEAL ECHOCARDIOGRAM (TEE);  Surgeon: Wendall Stade, MD;  Location: [facility] ENDOSCOPY;  Service: Cardiovascular;  Laterality: N/A;    Assessment / Plan / Recommendation Clinical Impression  HPI: Jacqueline Bonilla is a 68 y.o. female with PMHx of anemia, Anxiety, Depression, Dysmenorrhea, Endometriosis, Fibroid, Hypertension, Implantable loop recorder present (2021), Microhematuria, Osteoporosis, Tachycardia, and Thrombocytopenia . She was admitted to [facility] on 03/27/2023 for increased confusion, balance deficits, and difficulty speaking.  Of note, she has a history of prior stroke left MCA status post thrombectomy in 2022, and has been taken off of Brilinta in February 2025. Admission chemistries unremarkable, urine drug screen positive marijuana. Echocardiogram ejection fraction of 60 to 65% no wall motion abnormalities grade 1 diastolic dysfunction. Therapy evaluations completed and due to patient's decreased functional mobility was admitted for a comprehensive rehab program.    CT head negative on intake, MRI showed evolving infarct in the left hemisphere and the patient was given TNK and admitted by neurology for acute CVA.   Per chart review, patient lives with her spouse in a house with a single level and 5 steps to enter.  She is independent of mobility and ADLs at baseline.  Currently, she is mod assist for lower body dressing, supervision for bed mobility, contact-guard assist for transfers, and can walk min assist 300 feet with a single person hand-held assist.  SLP performed an evaluation and found difficulties with sustained attention, awareness, short-term memory, problem-solving and executive function as well as some mild residual aphasia; unsure how much is baseline given prior cognitive deficits, her husband feels she is about at her baseline with memory deficits.  Per review of neuro psych evaluation 05/06/2020,  patient was fully dependent on husband for complex tasks such as driving and finances, most instrumental test such as cooking, chores, and grocery shopping; patient's husband confirms that this is still the case.  OT eval pending.  She has been to inpatient rehab  for prior strokes July 2022 and December 2022, follows with Dr. Wynn Banker.  Since then, she has had progressive frequency of falls at home, as well as decreased confidence in ambulation such that she is increasingly dependent on her husband.  This escalated significantly about 6 weeks ago, when they had to come home from a family trip early because she had 4 falls within a few days at the daughter's home  Clinical Impression:  Bedside Swallow Evaluation: A bedside swallow evaluation was completed to assess for oropharyngeal dysphagia. Oral mechanism exam WFL. POs administered included thin liquids, purees and solids. Patient with timely mastication, adequate oral clearance and occasional throat clears after consecutive/large sips of thin liquids. Recommend continuation of regular/thin diet with single sips of thin liquids. Patient completed MBS in May of 2024, which noted occasional throat clear/coughing despite no airway invasion. Patient may benefit from repeat MBS if s/sx of aspiration persist despite use of aspiration precautions.  Cognitive-Linguistic:Patient was evaluated via the Cognistat to assess cognitive-linguistic functioning. Patient oriented to self, situation and location, however recalled month as "August." Patient with significant deficits in LTM/STM as she was unable to recall basic personal information including how she met her husband and where she went to school. Patient with further deficits in problem solving and awareness of mistakes exhibited by difficulty during calculations and verbal judgement task. 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 Patient with functional expressive/receptive language during this evaluation, though chart notes occasional instances of anomia, likely residual from prior L CVAs. Patient is 100% intelligible at the conversational level. Recommend targeting orientation, memory,  problem solving and emergent awareness during inpatient stay.  Pt would benefit from skilled ST services to maximize dysphagia and cognition in order to maximize functional independence at d/c. Anticipate patient will require 24 hour supervision at d/c and f/u SLP services.    Skilled Therapeutic Interventions          Patient evaluated using a standardized cognitive linguistic assessment and bedside swallow evaluation to assess current cognitive, communicative and swallowing function. See above for details.    SLP Assessment  Patient will need skilled Speech Lanaguage Pathology Services during CIR admission    Recommendations  SLP Diet Recommendations: Age appropriate regular solids;Thin Liquid Administration via: Cup Medication Administration: Whole meds with liquid Supervision: Intermittent supervision to cue for compensatory strategies;Staff to assist with self feeding Compensations: Minimize environmental distractions;Slow rate;Small sips/bites Postural Changes and/or Swallow Maneuvers: Seated upright 90 degrees Oral Care Recommendations: Oral care BID Patient destination: Home Follow up Recommendations: Home Health SLP;Outpatient SLP Equipment Recommended: None recommended by SLP    SLP Frequency 3 to 5 out of 7 days   SLP Duration  SLP Intensity  SLP Treatment/Interventions 7-10  Minumum of 1-2 x/day, 30 to 90 minutes  Cognitive remediation/compensation;Cueing hierarchy;Dysphagia/aspiration precaution training;Environmental controls;Functional tasks;Internal/external aids;Patient/family education;Speech/Language facilitation;Therapeutic Activities    Pain None reported to SLP  SLP Evaluation Cognition Overall Cognitive Status: Impaired/Different from baseline Arousal/Alertness: Awake/alert Orientation Level: Oriented to person;Oriented to situation;Disoriented to time;Oriented to place Year: 2025 Month: August Day of Week: Correct Attention: Sustained Sustained  Attention: Appears intact Memory: Impaired Memory Impairment: Decreased recall of new information;Retrieval deficit;Decreased long term memory;Decreased short  term memory Decreased Long Term Memory: Verbal basic;Functional basic Decreased Short Term Memory: Verbal basic;Functional basic Awareness: Impaired Awareness Impairment: Emergent impairment Problem Solving: Impaired Problem Solving Impairment: Verbal basic;Functional basic  Comprehension Auditory Comprehension Overall Auditory Comprehension: Appears within functional limits for tasks assessed Yes/No Questions: Within Functional Limits Commands: Within Functional Limits Complex Commands: 75-100% accurate Expression Expression Primary Mode of Expression: Verbal Verbal Expression Overall Verbal Expression: Appears within functional limits for tasks assessed Initiation: No impairment Repetition: No impairment Naming: No impairment Confrontation: Within functional limits Oral Motor Oral Motor/Sensory Function Overall Oral Motor/Sensory Function: Within functional limits Motor Speech Overall Motor Speech: Appears within functional limits for tasks assessed  Care Tool Care Tool Cognition Ability to hear (with hearing aid or hearing appliances if normally used Ability to hear (with hearing aid or hearing appliances if normally used): 0. Adequate - no difficulty in normal conservation, social interaction, listening to TV   Expression of Ideas and Wants Expression of Ideas and Wants: 3. Some difficulty - exhibits some difficulty with expressing needs and ideas (e.g, some words or finishing thoughts) or speech is not clear   Understanding Verbal and Non-Verbal Content Understanding Verbal and Non-Verbal Content: 3. Usually understands - understands most conversations, but misses some part/intent of message. Requires cues at times to understand  Memory/Recall Ability Memory/Recall Ability : Current season;That he or she is in a  hospital/hospital unit    Bedside Swallowing Assessment General Diet Prior to this Study: Regular;Thin liquids (Level 0) Temperature Spikes Noted: No Oral Cavity - Dentition: Adequate natural dentition Self-Feeding Abilities: Needs set up Vision: Functional for self-feeding Volitional Cough: Strong Volitional Swallow: Able to elicit  Ice Chips Ice chips: Not tested Thin Liquid Thin Liquid: Impaired Presentation: Self Fed;Spoon;Cup Pharyngeal  Phase Impairments: Suspected delayed Swallow;Throat Clearing - Delayed;Throat Clearing - Immediate Nectar Thick Nectar Thick Liquid: Not tested Honey Thick Honey Thick Liquid: Not tested Puree Puree: Within functional limits Presentation: Self Fed;Spoon Solid Solid: Within functional limits Presentation: Self Fed BSE Assessment Risk for Aspiration Impact on safety and function: Mild aspiration risk Other Related Risk Factors: Previous CVA;History of dysphagia;Cognitive impairment  Short Term Goals: Week 1: SLP Short Term Goal 1 (Week 1): STG = LTG due to ELOS  Refer to Care Plan for Long Term Goals  Recommendations for other services: None   Discharge Criteria: Patient will be discharged from SLP if patient refuses treatment 3 consecutive times without medical reason, if treatment goals not met, if there is a change in medical status, if patient makes no progress towards goals or if patient is discharged from hospital.  The above assessment, treatment plan, treatment alternatives and goals were discussed and mutually agreed upon: by patient  Ellagrace Yoshida M.A., CCC-SLP 04/02/2023, 2:32 PM

## 2023-04-02 NOTE — Telephone Encounter (Signed)
 Patient Product/process development scientist completed.    The patient is insured through [employer]. Patient has Medicare and is not eligible for a copay card, but may be able to apply for patient assistance or Medicare RX Payment Plan (Patient Must reach out to their plan, if eligible for payment plan), if available.    Ran test claim for Brilinta 90 mg and the current 30 day co-pay is $297.00 due to a $250.00 deductible.  Will be $47.00 once deductible is met.   This test claim was processed through [facility]- copay amounts may vary at other pharmacies due to pharmacy/plan contracts, or as the patient moves through the different stages of their insurance plan.     Jacqueline Bonilla, CPHT Pharmacy Technician III Certified Patient Advocate [facility] Patient Advocate Team Direct Number: [phone]  Fax: [phone]

## 2023-04-02 NOTE — Progress Notes (Signed)
 Inpatient Rehabilitation  Patient information reviewed and entered into eRehab system by [facility]. Karen Kays., CCC/SLP, PPS Coordinator.  Information including medical coding, functional ability and quality indicators will be reviewed and updated through discharge.

## 2023-04-02 NOTE — Progress Notes (Signed)
 PROGRESS NOTE   Subjective/Complaints:  No issues overnite   ROS- no CP, SOB, N/V/D  Objective:   No results found. Recent Labs    04/02/23 0525  WBC 4.0  HGB 12.0  HCT 36.3  PLT 226   Recent Labs    04/02/23 0525  NA 138  K 4.0  CL 104  CO2 24  GLUCOSE 95  BUN 17  CREATININE 1.05*  CALCIUM 9.4    Intake/Output Summary (Last 24 hours) at 04/02/2023 0729 Last data filed at 04/01/2023 1828 Gross per 24 hour  Intake 120 ml  Output --  Net 120 ml        Physical Exam: Vital Signs Blood pressure (!) 142/77, pulse 72, temperature 98.1 F (36.7 C), temperature source Oral, resp. rate 18, height 5\' 4"  (1.626 m), weight 51.7 kg, last menstrual period 01/02/2003, SpO2 100%.  General: No acute distress Mood and affect are appropriate Heart: Regular rate and rhythm no rubs murmurs or extra sounds Lungs: Clear to auscultation, breathing unlabored, no rales or wheezes Abdomen: Positive bowel sounds, soft nontender to palpation, nondistended Extremities: No clubbing, cyanosis, or edema Skin: No evidence of breakdown, no evidence of rash Neurologic: Cranial nerves II through XII intact, motor strength is 5/5 inLeft 4/5 RIght deltoid, bicep, tricep, grip, hip flexor, knee extensors, ankle dorsiflexor and plantar flexor Sensory exam normal sensation to light touch  Cerebellar exam mild dysmetria R FNF Musculoskeletal: Full range of motion in all 4 extremities. No joint swelling    Assessment/Plan: 1. Functional deficits which require 3+ hours per day of interdisciplinary therapy in a comprehensive inpatient rehab setting. Physiatrist is providing close team supervision and 24 hour management of active medical problems listed below. Physiatrist and rehab team continue to assess barriers to discharge/monitor patient progress toward functional and medical goals  Care Tool:  Bathing              Bathing assist        Upper Body Dressing/Undressing Upper body dressing        Upper body assist      Lower Body Dressing/Undressing Lower body dressing            Lower body assist       Toileting Toileting    Toileting assist       Transfers Chair/bed transfer  Transfers assist           Locomotion Ambulation   Ambulation assist              Walk 10 feet activity   Assist           Walk 50 feet activity   Assist           Walk 150 feet activity   Assist           Walk 10 feet on uneven surface  activity   Assist           Wheelchair     Assist               Wheelchair 50 feet with 2 turns activity    Assist  Wheelchair 150 feet activity     Assist          Blood pressure (!) 142/77, pulse 72, temperature 98.1 F (36.7 C), temperature source Oral, resp. rate 18, height 5\' 4"  (1.626 m), weight 51.7 kg, last menstrual period 01/02/2003, SpO2 100%.  Medical Problem List and Plan: 1. Functional deficits secondary to left MCA infarction status post TNK as well as history of left MCA infarction 2022 and received CIR             -patient may shower             -ELOS/Goals: 7 to 10 days supervision goals 2.  Antithrombotics: -DVT/anticoagulation:  Mechanical: Antiembolism stockings, thigh (TED hose) Bilateral lower extremities             -antiplatelet therapy: Aspirin 81 mg daily and Brilinta 90 mg twice daily x 4 weeks then aspirin alone.  Patient enrolled in [place] stroke research study 3. Pain Management: Tylenol as needed 4. Mood/Behavior/Sleep: Provide emotional support             -antipsychotic agents: N/A 5. Neuropsych/cognition: This patient is not capable of making decisions on her own behalf. 6. Skin/Wound Care: Routine skin checks 7. Fluids/Electrolytes/Nutrition: Routine in and outs with follow-up chemistries    Latest Ref Rng & Units 04/02/2023    5:25 AM 03/27/2023    9:53 AM  03/27/2023    9:47 AM  BMP  Glucose 70 - 99 mg/dL 95  540  981   BUN 8 - 23 mg/dL 17  21  20    Creatinine 0.44 - 1.00 mg/dL 1.91  4.78  2.95   Sodium 135 - 145 mmol/L 138  140  138   Potassium 3.5 - 5.1 mmol/L 4.0  4.2  4.2   Chloride 98 - 111 mmol/L 104  103  103   CO2 22 - 32 mmol/L 24   27   Calcium 8.9 - 10.3 mg/dL 9.4   9.6     8.  Hyperlipidemia.  Lipitor .9.  History of thrombocytopenia.  Latest platelet count 262,000    Latest Ref Rng & Units 04/02/2023    5:25 AM 03/27/2023    9:53 AM 03/27/2023    9:47 AM  CBC  WBC 4.0 - 10.5 K/uL 4.0   5.0   Hemoglobin 12.0 - 15.0 g/dL 62.1  30.8  65.7   Hematocrit 36.0 - 46.0 % 36.3  40.0  39.9   Platelets 150 - 400 K/uL 226   262     10.  Urine drug screen positive marijuana.  Counseling    LOS: 1 days A FACE TO FACE EVALUATION WAS PERFORMED  [name] 04/02/2023, 7:29 AM

## 2023-04-02 NOTE — Plan of Care (Signed)
  Problem: RH Swallowing Goal: LTG Patient will consume least restrictive diet using compensatory strategies with assistance (SLP) Description: LTG:  Patient will consume least restrictive diet using compensatory strategies with assistance (SLP) Flowsheets (Taken 04/02/2023 1429) LTG: Pt Patient will consume least restrictive diet using compensatory strategies with assistance of (SLP): Supervision   Problem: RH Cognition - SLP Goal: RH LTG Patient will demonstrate orientation with cues Description:  LTG:  Patient will demonstrate orientation to person/place/time/situation with cues (SLP)   Flowsheets (Taken 04/02/2023 1429) LTG Patient will demonstrate orientation to: Time LTG: Patient will demonstrate orientation using cueing (SLP): Minimal Assistance - Patient > 75%   Problem: RH Problem Solving Goal: LTG Patient will demonstrate problem solving for (SLP) Description: LTG:  Patient will demonstrate problem solving for basic/complex daily situations with cues  (SLP) Flowsheets (Taken 04/02/2023 1429) LTG: Patient will demonstrate problem solving for (SLP): Basic daily situations LTG Patient will demonstrate problem solving for: Minimal Assistance - Patient > 75%   Problem: RH Memory Goal: LTG Patient will use memory compensatory aids to (SLP) Description: LTG:  Patient will use memory compensatory aids to recall biographical/new, daily complex information with cues (SLP) Flowsheets (Taken 04/02/2023 1429) LTG: Patient will use memory compensatory aids to (SLP): Minimal Assistance - Patient > 75%   Problem: RH Attention Goal: LTG Patient will demonstrate this level of attention during functional activites (SLP) Description: LTG:  Patient will will demonstrate this level of attention during functional activites (SLP) Flowsheets (Taken 04/02/2023 1429) Patient will demonstrate during cognitive/linguistic activities the attention type of: Sustained LTG: Patient will demonstrate this level of  attention during cognitive/linguistic activities with assistance of (SLP): Minimal Assistance - Patient > 75% Number of minutes patient will demonstrate attention during cognitive/linguistic activities: 10   Problem: RH Awareness Goal: LTG: Patient will demonstrate awareness during functional activites type of (SLP) Description: LTG: Patient will demonstrate awareness during functional activites type of (SLP) Flowsheets (Taken 04/02/2023 1429) Patient will demonstrate during cognitive/linguistic activities awareness type of: Emergent LTG: Patient will demonstrate awareness during cognitive/linguistic activities with assistance of (SLP): Minimal Assistance - Patient > 75%

## 2023-04-02 NOTE — Evaluation (Signed)
 Physical Therapy Assessment and Plan  Patient Details  Name: Jacqueline Bonilla MRN: [id number] Date of Birth: 1955-07-09  PT Diagnosis: Difficulty walking, Hemiparesis dominant, Impaired cognition, Impaired sensation, and Muscle weakness Rehab Potential: Fair ELOS: 8-10 days   Today's Date: 04/02/2023 PT Individual Time: 4034-7425 PT Individual Time Calculation (min): 72 min    Hospital Problem: Active Problems:   Left middle cerebral artery stroke [facility])   Past Medical History:  Past Medical History:  Diagnosis Date   Anemia    Anxiety    Depression    Dysmenorrhea    Endometriosis    Fibroid    Hypertension    Implantable loop recorder present 2021   Microhematuria    negative workup   Osteoporosis    Tachycardia    Thrombocytopenia (HCC)    Past Surgical History:  Past Surgical History:  Procedure Laterality Date   BREAST BIOPSY     BUBBLE STUDY  12/08/2020   Procedure: BUBBLE STUDY;  Surgeon: Wendall Stade, MD;  Location: [facility] ENDOSCOPY;  Service: Cardiovascular;;   CESAREAN SECTION     hysteroscopic resection     implantable loop recorder implant  10/21/2019   Medtronic Reveal Linq model LNQ 22 ([place] [id number]) implantable loop recorder   IR CT HEAD LTD  12/01/2020   IR PERCUTANEOUS ART THROMBECTOMY/INFUSION INTRACRANIAL INC DIAG ANGIO  12/01/2020   IR US GUIDE VASC ACCESS RIGHT  12/01/2020   ORIF HUMERUS FRACTURE Left 07/31/2021   Procedure: OPEN REDUCTION INTERNAL FIXATION (ORIF) DISTAL HUMERUS FRACTURE;  Surgeon: Roby Lofts, MD;  Location: [facility] OR;  Service: Orthopedics;  Laterality: Left;   RADIOLOGY WITH ANESTHESIA N/A 12/01/2020   Procedure: IR WITH ANESTHESIA - CODE STROKE;  Surgeon: Radiologist, Medication, MD;  Location: [facility] OR;  Service: Radiology;  Laterality: N/A;   TEE WITHOUT CARDIOVERSION N/A 12/08/2020   Procedure: TRANSESOPHAGEAL ECHOCARDIOGRAM (TEE);  Surgeon: Wendall Stade, MD;  Location: [facility] ENDOSCOPY;  Service: Cardiovascular;  Laterality: N/A;     Assessment & Plan Clinical Impression: Patient is a 68 y.o. right-handed female with history of anxiety/depression, hypertension, thrombocytopenia, left MCA infarction December 2022 as well as mechanical thrombectomy after receiving TNK for left M2/M3 occlusion complicated by minor subarachnoid hemorrhage. She did have a loop recorder placed at that time and maintained on aspirin. She was taken off of Brilinta in February 2025. Patient received received inpatient rehab services 12/08/2020 - 12/17/2020 and was discharged to home contact-guard for ambulation. Per chart review patient lives with spouse. 1 level home 5 steps to entry. Presented 03/27/2023 with aphasia, altered mental status and unstable gait with frequent falls. CT/MRI showed small acute left MCA infarction. Severe chronic small vessel ischemic disease and cerebral atrophy. CTA showed no large vessel occlusion.She did receive TNK. Admission chemistries unremarkable, urine drug screen positive marijuana. Echocardiogram ejection fraction of 60 to 65% no wall motion abnormalities grade 1 diastolic dysfunction. Neurology follow-up currently maintained on aspirin as well as Brilinta 90 mg twice daily with study drug followed by neurology services. Tolerating a regular consistency diet. Therapy evaluations completed and due to patient's decreased functional mobility was admitted for a comprehensive rehab program. Patient transferred to CIR on 04/01/2023 .   Patient currently requires min assist with mobility secondary to muscle weakness, decreased cardiorespiratoy endurance, impaired timing and sequencing, motor apraxia, and decreased coordination, decreased visual perceptual skills and field cut, decreased midline orientation, decreased attention to left, decreased attention to right, and decreased motor planning, decreased attention, decreased awareness, decreased problem  solving, decreased safety awareness, and decreased memory, and decreased sitting  balance, decreased standing balance, hemiplegia, decreased balance strategies, and difficulty maintaining precautions.  Prior to hospitalization, patient was modified independent  with mobility and lived with Spouse in a House home.  Home access is 5Stairs to enter.  Patient will benefit from skilled PT intervention to maximize safe functional mobility, minimize fall risk, and decrease caregiver burden for planned discharge home with 24 hour supervision.  Anticipate patient will benefit from follow up OP at discharge.  PT - End of Session Activity Tolerance: Tolerates 30+ min activity with multiple rests Endurance Deficit: Yes PT Assessment Rehab Potential (ACUTE/IP ONLY): Fair PT Barriers to Discharge: Inaccessible home environment;Decreased caregiver support;Incontinence;Lack of/limited family support;Insurance for SNF coverage;Medication compliance PT Patient demonstrates impairments in the following area(s): Balance;Behavior;Edema;Endurance;Motor;Pain;Skin Integrity;Safety;Perception;Sensory PT Transfers Functional Problem(s): Bed Mobility;Bed to Chair;Car;Furniture PT Locomotion Functional Problem(s): Ambulation;Wheelchair Mobility;Stairs PT Plan PT Intensity: Minimum of 1-2 x/day ,45 to 90 minutes PT Frequency: 5 out of 7 days PT Duration Estimated Length of Stay: 8-10 days PT Treatment/Interventions: Ambulation/gait training;Community reintegration;DME/adaptive equipment instruction;Neuromuscular re-education;Psychosocial support;UE/LE Strength taining/ROM;Stair training;Wheelchair propulsion/positioning;UE/LE Coordination activities;Therapeutic Activities;Skin care/wound management;Pain management;Functional electrical stimulation;Discharge planning;Balance/vestibular training;Cognitive remediation/compensation;Disease management/prevention;Functional mobility training;Patient/family education;Splinting/orthotics;Therapeutic Exercise;Visual/perceptual remediation/compensation PT Transfers  Anticipated Outcome(s): supervision PT Locomotion Anticipated Outcome(s): CGA PT Recommendation Recommendations for Other Services: None Patient destination: Home Equipment Recommended: To be determined   PT Evaluation Precautions/Restrictions Precautions Precautions: Fall Precaution/Restrictions Comments: R hemi Restrictions Weight Bearing Restrictions Per Provider Order: No General   Vital Signs  Pain Pain Assessment Pain Scale: 0-10 Pain Score: 0-No pain Pain Interference Pain Interference Pain Effect on Sleep: 0. Does not apply - I have not had any pain or hurting in the past 5 days Pain Interference with Therapy Activities: 0. Does not apply - I have not received rehabilitationtherapy in the past 5 days Pain Interference with Day-to-Day Activities: 1. Rarely or not at all Home Living/Prior Functioning Home Living Available Help at Discharge: Family;Available 24 hours/day Type of Home: House Home Access: Stairs to enter [employer] of Steps: 5 Entrance Stairs-Rails: Right Home Layout: One level Bathroom Shower/Tub: Health visitor: Handicapped height Bathroom Accessibility: Yes  Lives With: Spouse Prior Function Level of Independence: Independent with basic ADLs;Independent with homemaking with ambulation;Independent with gait;Independent with transfers  Able to Take Stairs?: Yes Driving: Yes (thinks she was just starting to practice driving again) Vision/Perception  Vision - History Ability to See in Adequate Light: 1 Impaired Vision - Assessment Eye Alignment: Within Functional Limits Ocular Range of Motion: Restricted looking up;Impaired-to be further tested in functional context Alignment/Gaze Preference: Within Defined Limits Tracking/Visual Pursuits: Decreased smoothness of horizontal tracking;Decreased smoothness of vertical tracking;Requires cues, head turns, or add eye shifts to track;Unable to hold eye position out of  midline Saccades: Additional eye shifts occurred during testing;Additional head turns occurred during testing;Decreased speed of saccadic movement Convergence: Impaired (comment) (unable to bring eyes together past midline) Perception Perception: Impaired Preception Impairment Details: Inattention/Neglect Praxis Praxis: Impaired Praxis Impairment Details: Limb apraxia (mild RUE limb apraxia)  Cognition Overall Cognitive Status: Impaired/Different from baseline Arousal/Alertness: Awake/alert Orientation Level: Oriented to person;Oriented to situation;Disoriented to time;Oriented to place Memory: Impaired Memory Impairment: Decreased recall of new information;Retrieval deficit;Decreased long term memory;Decreased short term memory Awareness: Impaired Problem Solving: Impaired Safety/Judgment: Impaired Sensation Sensation Light Touch: Appears Intact Proprioception: Impaired by gross assessment Coordination Gross Motor Movements are Fluid and Coordinated: No Fine Motor Movements are Fluid and Coordinated: No Coordination and Movement Description: limited  coordination and use of RUE in functional tasks; decreased smooth, coordinated movement in RLE. Heel Shin Test: Slower on R than L Motor  Motor Motor: Hemiplegia Motor - Skilled Clinical Observations: min limb apraxia with RUE, mild apraxia overall and fluctuates   Trunk/Postural Assessment  Cervical Assessment Cervical Assessment: Within Functional Limits Thoracic Assessment Thoracic Assessment: Within Functional Limits Lumbar Assessment Lumbar Assessment: Exceptions to WFL (slight posterior pelvic tilt in sitting) Postural Control Postural Control: Deficits on evaluation Trunk Control: impaired during gait and needs support at trunk or hand held assist to support balance  Balance Standardized Balance Assessment Standardized Balance Assessment: Timed Up and Go Test Timed Up and Go Test TUG: Normal TUG Normal TUG (seconds):  19.69 Static Sitting Balance Static Sitting - Balance Support: Feet unsupported;Feet supported Static Sitting - Level of Assistance: Other (comment) (CGA) Dynamic Sitting Balance Dynamic Sitting - Balance Support: Feet supported;Feet unsupported Dynamic Sitting - Level of Assistance: 4: Min assist Static Standing Balance Static Standing - Balance Support: During functional activity Static Standing - Level of Assistance: 4: Min assist Dynamic Standing Balance Dynamic Standing - Balance Support: During functional activity Dynamic Standing - Level of Assistance: 4: Min assist Extremity Assessment      RLE Assessment RLE Assessment: Exceptions to [facility] RLE Strength Right Hip Flexion: 4-/5 Right Hip Extension: 4-/5 Right Hip ABduction: 4-/5 Right Hip ADduction: 4-/5 Right Knee Flexion: 3+/5 Right Knee Extension: 4/5 Right Ankle Dorsiflexion: 4-/5 Right Ankle Plantar Flexion: 4-/5 LLE Assessment LLE Assessment: Exceptions to WFL LLE Strength LLE Overall Strength: Deficits Left Hip Flexion: 4/5 Left Hip Extension: 4/5 Left Hip ABduction: 4/5 Left Hip ADduction: 4-/5 Left Knee Flexion: 4/5 Left Knee Extension: 4/5 Left Ankle Dorsiflexion: 4-/5 Left Ankle Plantar Flexion: 4-/5  Care Tool Care Tool Bed Mobility Roll left and right activity   Roll left and right assist level: Supervision/Verbal cueing    Sit to lying activity   Sit to lying assist level: Contact Guard/Touching assist    Lying to sitting on side of bed activity   Lying to sitting on side of bed assist level: the ability to move from lying on the back to sitting on the side of the bed with no back support.: Contact Guard/Touching assist     Care Tool Transfers Sit to stand transfer   Sit to stand assist level: Contact Guard/Touching assist    Chair/bed transfer   Chair/bed transfer assist level: Minimal Assistance - Patient > 75%    Car transfer   Car transfer assist level: Minimal Assistance - Patient >  75%      Care Tool Locomotion Ambulation   Assist level: Minimal Assistance - Patient > 75% Assistive device: Hand held assist Max distance: 110 ft  Walk 10 feet activity   Assist level: Minimal Assistance - Patient > 75% Assistive device: Hand held assist   Walk 50 feet with 2 turns activity   Assist level: Minimal Assistance - Patient > 75% Assistive device: Hand held assist  Walk 150 feet activity Walk 150 feet activity did not occur: Safety/medical concerns      Walk 10 feet on uneven surfaces activity Walk 10 feet on uneven surfaces activity did not occur: Safety/medical concerns      Stairs   Assist level: Minimal Assistance - Patient > 75% Stairs assistive device: 2 hand rails Max number of stairs: 4  Walk up/down 1 step activity   Walk up/down 1 step (curb) assist level: Contact Guard/Touching assist Walk up/down 1 step or  curb assistive device: 2 hand rails  Walk up/down 4 steps activity   Walk up/down 4 steps assist level: Minimal Assistance - Patient > 75% Walk up/down 4 steps assistive device: 2 hand rails  Walk up/down 12 steps activity Walk up/down 12 steps activity did not occur: Safety/medical concerns      Pick up small objects from floor Pick up small object from the floor (from standing position) activity did not occur: Safety/medical concerns      Wheelchair Is the patient using a wheelchair?: Yes Type of Wheelchair: Manual   Wheelchair assist level: Total Assistance - Patient < 25% Max wheelchair distance: 115 ft  Wheel 50 feet with 2 turns activity   Assist Level: Total Assistance - Patient < 25%  Wheel 150 feet activity   Assist Level: Total Assistance - Patient < 25%    Refer to Care Plan for Long Term Goals  SHORT TERM GOAL WEEK 1 PT Short Term Goal 1 (Week 1): STG = LTG d/t ELOS  Recommendations for other services: None   Skilled Therapeutic Intervention Mobility Bed Mobility Bed Mobility: Supine to Sit;Sit to Supine;Sitting - Scoot  to Edge of Bed Supine to Sit: Contact Guard/Touching assist Sitting - Scoot to Edge of Bed: Contact Guard/Touching assist Sit to Supine: Contact Guard/Touching assist Transfers Transfers: Sit to Stand;Stand to Sit;Stand Pivot Transfers Sit to Stand: Minimal Assistance - Patient > 75% Stand to Sit: Minimal Assistance - Patient > 75% Stand Pivot Transfers: Minimal Assistance - Patient > 75% Stand Pivot Transfer Details: Tactile cues for placement;Verbal cues for precautions/safety;Verbal cues for technique;Verbal cues for safe use of DME/AE Transfer (Assistive device): 1 person hand held assist Locomotion  Gait Ambulation: Yes Gait Assistance: Minimal Assistance - Patient > 75% Assistive device: None Gait Assistance Details: Verbal cues for safe use of DME/AE;Verbal cues for precautions/safety;Visual cues for safe use of DME/AE;Tactile cues for placement;Verbal cues for technique Gait Gait: Yes Gait Pattern: Impaired Gait Pattern: Step-through pattern (variable step lengths and heights throughout, unaware and unable to self-correct L lean, holds L knee in mild flexion) Gait velocity: decreased Stairs / Additional Locomotion Stairs: Yes Stairs Assistance: Minimal Assistance - Patient > 75% Stair Management Technique: Two rails Number of Stairs: 4 Height of Stairs: 6  Skilled Intervention: PT Evaluation completed; see above for results. PT educated patient in roles of PT vs OT, PT POC, rehab potential, rehab goals, and discharge recommendations along with recommendation for follow-up rehabilitation services. Individual treatment initiated:  Patient supine in bed upon PT arrival. Patient alert and agreeable to PT session.   No pain complaint during session.  Therapeutic Activity: Bed Mobility: Patient performed supine <> sit with overall CGA as she is requested to move the bed linens off of her but she pulls them to the side of the bed she's getting out of. Difficulty getting over the  bed linens but is able to bring feet to floor. Dons shoes with ModA. Pt asked re: where her clothes are 4x despite showing her and having her pick out an outfit for the day that she will don with OT after shower. Transfers: Patient performed sit <> stand transfers throughout session with CGA/ MinA but uses stutter step to complete and requires MinA to control descent to sit. Provided vc/ tc forimproving technique and improving control up from and back into seat. Car transfer performed to higher truck height seat with CGA. Does use door to steady self on exiting and vc to use RW or HHA from  caregiver.   Gait Training:  Patient ambulated 110 ft using no AD with up to CGA/ MinA. Good step through gait pattern with larger steps than noted during pivot stepping. Variable step lengths/ heights. Minimal path deviations. Return trip to room with pt using RW but with significant L lean and runs into obstacles on L side in pt's periphery. Increased variation in step heights/ lengths and she picks up RW on R side d/t L lean.  Pt participated in Timed Up and Go test in order to set outcome measurement and assess gait/ balance. TUG completed in avg of 19.69 seconds (A score of >13.5 seconds indicates patient is at a high fall risk. Pt educated on interpretation of their score)  NMR performed for improvements in motor control and coordination, balance, sequencing, judgement, and self confidence/ efficacy in performing all aspects of mobility at highest level of independence.   Patient supine in bed  at end of session with brakes locked, bed alarm set, and all needs within reach.  Discharge Criteria: Patient will be discharged from PT if patient refuses treatment 3 consecutive times without medical reason, if treatment goals not met, if there is a change in medical status, if patient makes no progress towards goals or if patient is discharged from hospital.  The above assessment, treatment plan, treatment alternatives  and goals were discussed and mutually agreed upon: by patient  Loel Dubonnet PT, DPT, CSRS 04/02/2023, 7:04 PM

## 2023-04-02 NOTE — Plan of Care (Signed)
  Problem: Consults Goal: RH STROKE PATIENT EDUCATION Description: See Patient Education module for education specifics  Outcome: Progressing   Problem: RH BOWEL ELIMINATION Goal: RH STG MANAGE BOWEL WITH ASSISTANCE Description: STG Manage Bowel with toileting Assistance. Outcome: Progressing Goal: RH STG MANAGE BOWEL W/MEDICATION W/ASSISTANCE Description: STG Manage Bowel with Medication with mod I  Assistance. Outcome: Progressing   Problem: RH SAFETY Goal: RH STG ADHERE TO SAFETY PRECAUTIONS W/ASSISTANCE/DEVICE Description: STG Adhere to Safety Precautions With  cues Assistance/Device. Outcome: Progressing   Problem: RH KNOWLEDGE DEFICIT Goal: RH STG INCREASE KNOWLEDGE OF HYPERTENSION Description: Patient and spouse will be able to manage HTN using educational resources for medications and dietary modifications independently Outcome: Progressing Goal: RH STG INCREASE KNOWLEGDE OF HYPERLIPIDEMIA Description: Patient and spouse will be able to manage HLD using educational resources for medications and dietary modifications independently Outcome: Progressing Goal: RH STG INCREASE KNOWLEDGE OF STROKE PROPHYLAXIS Description: Patient and spouse will be able to manage secondary risks using educational resources for medications and dietary modifications independently Outcome: Progressing

## 2023-04-03 DIAGNOSIS — I63512 Cerebral infarction due to unspecified occlusion or stenosis of left middle cerebral artery: Secondary | ICD-10-CM | POA: Diagnosis not present

## 2023-04-03 DIAGNOSIS — I69993 Ataxia following unspecified cerebrovascular disease: Secondary | ICD-10-CM | POA: Diagnosis not present

## 2023-04-03 NOTE — Progress Notes (Signed)
 Physical Therapy Session Note  Patient Details  Name: FIZA NATION MRN: [id number] Date of Birth: 04/30/1955  Today's Date: 04/03/2023 PT Individual Time: 3086-5784; 1420 - 1503 PT Individual Time Calculation (min): 39 min; 43 min   Short Term Goals: Week 1:  PT Short Term Goal 1 (Week 1): STG = LTG d/t ELOS  SESSION 1 Skilled Therapeutic Interventions/Progress Updates: Patient sitting in recliner on entrance to room. Patient alert and agreeable to PT session.   Patient reported no pain or complaints. Pt stated that husband works out of the house, but can make up his own schedule as pt's goals as of evaluation remains overall supervision/CGA. Pt performed stand pivot transfers throughout session with minA and pt presentation of shuffle-like pivot with VC to increase step length to pivoting surface, and for hand placement when performing sit<>stand (mod cuing with added VC for pt to increase visual tracking to L side in order to use L UE). Pt transported from room<>main gym in [facility] for time management. Pt ambulated roughly 100' in main gym with CGA/minA. Pt with short step pattern (step through) but with increase cadence (pt reported being a fast walker at baseline) with VC to decrease cadence. Pt also required increased cuing to safely navigate turns and objects in room.   DGI performing this session with pt scoring 12/24 (overall CGA with some moments of light minA for safety).  - During object step over (hurdle at 6" height), pt stepped over to the R of hurdle vs directly over. PTA did not provide demonstration as pt was already in ready position standing with no AD (provided verbal instructions instead). Following demonstration, pt able to perform with no issue.   Patient sitting in recliner at end of session with brakes locked, belt alarm set, and all needs within reach.  SESSION 2 Skilled Therapeutic Interventions/Progress Updates: Patient sitting in recliner on entrance to room.  Patient alert and agreeable to PT session.   Patient reported no pain, and could not remember what was addressed to her following team conference about updated d/c date (hinted cueing required that ultimately had to lead to direct question).   Gait Training:  Pt ambulated from room<>main gym (roughly 140') using no AD with CGA. Pt with R UE in slight typical arm posture with VC to increase reciprocal arm swing (able to move UE out of posture and into pattern). Pt also cued to decrease cadence.   - Pt ambulated around main gym with RW with VC for hand placement. PTA cued pt to pull/push/maintain steady sequence of hand movements with wrist/digits and forearm on R UE with pt requiring increased cueing to maintain task, and to control motor planning during cues with pt having delay in adherence. Pt with CGA with max cuing to maintain steady path forward as pt will deviate from path to L and R. Pt ambulated on 28' green line with cues to keep line in direct center of B LE's and RW with max cuing to maintain. Pt with seated rest break following.   Neuromuscular Re-ed: NMR facilitated during session with focus on coordination, dynamic standing balance, motor control, environmental awareness. - Obstacle course set up with pt cued to target and touch called out colored cone while having to navigate bowling pins in RW without knocking them over. Pt able to recall color to touch without any assistance, but required mod/max cuing to adjust RW to avoid hitting pins (happened more on L side than R) with added cuing to maintain  all legs of RW on floor (would pick up R side of RW occasionally). Pt performed with CGA. Pt also with trunk rotated to the R, and unable to maintain neutral posture in RW.  - Pt performed without RW with improved ability to navigate cones without knocking them over. CGA throughout    NMR performed for improvements in motor control and coordination, balance, sequencing, judgement, and self  confidence/ efficacy in performing all aspects of mobility at highest level of independence.   Patient sitting in recliner at end of session with brakes locked, belt alarm set, and all needs within reach.       Therapy Documentation Precautions:  Precautions Precautions: Fall Precaution/Restrictions Comments: R hemi Restrictions Weight Bearing Restrictions Per Provider Order: No   Therapy/Group: Individual Therapy   Carmack PTA 04/03/2023, 3:40 PM

## 2023-04-03 NOTE — Plan of Care (Signed)
  Problem: RH Balance Goal: LTG Patient will maintain dynamic sitting balance (PT) Description: LTG:  Patient will maintain dynamic sitting balance with assistance during mobility activities (PT) Flowsheets (Taken 04/02/2023 1917) LTG: Pt will maintain dynamic sitting balance during mobility activities with:: Independent Goal: LTG Patient will maintain dynamic standing balance (PT) Description: LTG:  Patient will maintain dynamic standing balance with assistance during mobility activities (PT) Flowsheets (Taken 04/02/2023 1917) LTG: Pt will maintain dynamic standing balance during mobility activities with:: Supervision/Verbal cueing   Problem: Sit to Stand Goal: LTG:  Patient will perform sit to stand with assistance level (PT) Description: LTG:  Patient will perform sit to stand with assistance level (PT) Flowsheets (Taken 04/02/2023 1917) LTG: PT will perform sit to stand in preparation for functional mobility with assistance level: Supervision/Verbal cueing   Problem: RH Bed Mobility Goal: LTG Patient will perform bed mobility with assist (PT) Description: LTG: Patient will perform bed mobility with assistance, with/without cues (PT). Flowsheets (Taken 04/02/2023 1917) LTG: Pt will perform bed mobility with assistance level of: Independent with assistive device    Problem: RH Bed to Chair Transfers Goal: LTG Patient will perform bed/chair transfers w/assist (PT) Description: LTG: Patient will perform bed to chair transfers with assistance (PT). Flowsheets (Taken 04/02/2023 1917) LTG: Pt will perform Bed to Chair Transfers with assistance level: Supervision/Verbal cueing   Problem: RH Car Transfers Goal: LTG Patient will perform car transfers with assist (PT) Description: LTG: Patient will perform car transfers with assistance (PT). Flowsheets (Taken 04/02/2023 1917) LTG: Pt will perform car transfers with assist:: Supervision/Verbal cueing   Problem: RH Furniture Transfers Goal: LTG Patient  will perform furniture transfers w/assist (OT/PT) Description: LTG: Patient will perform furniture transfers  with assistance (OT/PT). Flowsheets (Taken 04/02/2023 1917) LTG: Pt will perform furniture transfers with assist:: Supervision/Verbal cueing   Problem: RH Ambulation Goal: LTG Patient will ambulate in controlled environment (PT) Description: LTG: Patient will ambulate in a controlled environment, # of feet with assistance (PT). Flowsheets (Taken 04/02/2023 1917) LTG: Pt will ambulate in controlled environ  assist needed:: Contact Guard/Touching assist LTG: Ambulation distance in controlled environment: more than 150 ft using LRAD Goal: LTG Patient will ambulate in home environment (PT) Description: LTG: Patient will ambulate in home environment, # of feet with assistance (PT). Flowsheets (Taken 04/02/2023 1917) LTG: Pt will ambulate in home environ  assist needed:: Contact Guard/Touching assist LTG: Ambulation distance in home environment: up to 50 ft per bout using LRAD   Problem: RH Stairs Goal: LTG Patient will ambulate up and down stairs w/assist (PT) Description: LTG: Patient will ambulate up and down # of stairs with assistance (PT) Flowsheets (Taken 04/02/2023 1917) LTG: Pt will ambulate up/down stairs assist needed:: Supervision/Verbal cueing LTG: Pt will  ambulate up and down number of stairs: at least 5 steps using HR setup as per home environment

## 2023-04-03 NOTE — IPOC Note (Addendum)
 Overall Plan of Care [facility]) Patient Details Name: Jacqueline Bonilla MRN: [id number] DOB: 03-21-1955  Admitting Diagnosis: Left CVA  Hospital Problems: Active Problems:   Left middle cerebral artery stroke [facility])     Functional Problem List: Nursing Pain, Bowel, Safety, Endurance, Medication Management  PT Balance, Behavior, Edema, Endurance, Motor, Pain, Skin Integrity, Safety, Perception, Sensory  OT Balance, Cognition, Motor, Vision, Perception  SLP Cognition, Nutrition  TR         Basic ADL's: OT Bathing, Dressing, Toileting, Grooming, Eating     Advanced  ADL's: OT       Transfers: PT Bed Mobility, Bed to Chair, Car, Occupational psychologist, Research scientist (life sciences): PT Ambulation, Psychologist, prison and probation services, Stairs     Additional Impairments: OT Fuctional Use of Upper Extremity  SLP Swallowing, Social Cognition   Problem Solving, Attention, Memory, Awareness  TR      Anticipated Outcomes Item Anticipated Outcome  Self Feeding independent  Swallowing  supervision A   Basic self-care  supervision with bathing and dressing  Toileting  supervision   Bathroom Transfers supervision  Bowel/Bladder  manage bowel w mod I assist  Transfers  supervision  Locomotion  CGA  Communication     Cognition  minA  Pain  < 4 with prns  Safety/Judgment  manage w cues   Therapy Plan: PT Intensity: Minimum of 1-2 x/day ,45 to 90 minutes PT Frequency: 5 out of 7 days PT Duration Estimated Length of Stay: 8-10 days OT Intensity: Minimum of 1-2 x/day, 45 to 90 minutes OT Frequency: 5 out of 7 days OT Duration/Estimated Length of Stay: 8-10 days SLP Intensity: Minumum of 1-2 x/day, 30 to 90 minutes SLP Frequency: 3 to 5 out of 7 days SLP Duration/Estimated Length of Stay: 7-10   Team Interventions: Nursing Interventions Patient/Family Education, Pain Management, Medication Management, Bowel Management, Disease Management/Prevention, Discharge Planning  PT interventions  Ambulation/gait training, Community reintegration, DME/adaptive equipment instruction, Neuromuscular re-education, Psychosocial support, UE/LE Strength taining/ROM, Stair training, Wheelchair propulsion/positioning, UE/LE Coordination activities, Therapeutic Activities, Skin care/wound management, Pain management, Functional electrical stimulation, Discharge planning, Warden/ranger, Cognitive remediation/compensation, Disease management/prevention, Functional mobility training, Patient/family education, Splinting/orthotics, Therapeutic Exercise, Visual/perceptual remediation/compensation  OT Interventions Balance/vestibular training, Cognitive remediation/compensation, DME/adaptive equipment instruction, Discharge planning, Functional mobility training, Neuromuscular re-education, Psychosocial support, Patient/family education, Self Care/advanced ADL retraining, UE/LE Strength taining/ROM, Therapeutic Exercise, Therapeutic Activities, UE/LE Coordination activities, Visual/perceptual remediation/compensation  SLP Interventions Cognitive remediation/compensation, Cueing hierarchy, Dysphagia/aspiration precaution training, Environmental controls, Functional tasks, Internal/external aids, Patient/family education, Speech/Language facilitation, Therapeutic Activities  TR Interventions    SW/CM Interventions Discharge Planning, Psychosocial Support, Patient/Family Education   Barriers to Discharge MD   cognition  Nursing Decreased caregiver support, Home environment access/layout 1 level 5 ste right rail w spouse  PT Inaccessible home environment, Decreased caregiver support, Incontinence, Lack of/limited family support, Insurance for SNF coverage, Medication compliance    OT      SLP      SW Decreased caregiver support, Lack of/limited family support, Community education officer for SNF coverage     Team Discharge Planning: Destination: PT-Home ,OT- Home , SLP-Home Projected Follow-up: PT-Home health PT,  24 hour supervision/assistance, OT-  Home health OT, SLP-Home Health SLP, Outpatient SLP Projected Equipment Needs: PT-To be determined, OT- To be determined, SLP-None recommended by SLP Equipment Details: PT- , OT-  Patient/family involved in discharge planning: PT- Patient,  OT-Patient, Family member/caregiver, SLP-Patient  MD ELOS: 10d Medical Rehab Prognosis:  Fair Assessment: The patient has been  admitted for CIR therapies with the diagnosis of Left CVA. The team will be addressing functional mobility, strength, stamina, balance, safety, adaptive techniques and equipment, self-care, bowel and bladder mgt, patient and caregiver education, Safety awareness. Goals have been set at Mod I. Anticipated discharge destination is Home.        See Team Conference Notes for weekly updates to the plan of care

## 2023-04-03 NOTE — Care Management (Signed)
 Inpatient Rehabilitation Center Individual Statement of Services  Patient Name:  Jacqueline Bonilla  Date:  04/03/2023  Welcome to the Inpatient Rehabilitation Center.  Our goal is to provide you with an individualized program based on your diagnosis and situation, designed to meet your specific needs.  With this comprehensive rehabilitation program, you will be expected to participate in at least 3 hours of rehabilitation therapies Monday-Friday, with modified therapy programming on the weekends.  Your rehabilitation program will include the following services:  Physical Therapy (PT), Occupational Therapy (OT), Speech Therapy (ST), 24 hour per day rehabilitation nursing, Therapeutic Recreaction (TR), Psychology, Neuropsychology, Care Coordinator, Rehabilitation Medicine, Nutrition Services, Pharmacy Services, and Other  Weekly team conferences will be held on Wednesdays to discuss your progress.  Your Inpatient Rehabilitation Care Coordinator will talk with you frequently to get your input and to update you on team discussions.  Team conferences with you and your family in attendance may also be held.  Expected length of stay: 8-10 days     Overall anticipated outcome: Supervision  Depending on your progress and recovery, your program may change. Your Inpatient Rehabilitation Care Coordinator will coordinate services and will keep you informed of any changes. Your Inpatient Rehabilitation Care Coordinator's name and contact numbers are listed  below.  The following services may also be recommended but are not provided by the Inpatient Rehabilitation Center:  Driving Evaluations Home Health Rehabiltiation Services Outpatient Rehabilitation Services Vocational Rehabilitation   Arrangements will be made to provide these services after discharge if needed.  Arrangements include referral to agencies that provide these services.  Your insurance has been verified to be:  [employer]  Your  primary doctor is:  Gilford Silvius  Pertinent information will be shared with your doctor and your insurance company.  Inpatient Rehabilitation Care Coordinator:  Susie Cassette [phone] or ([phone]  Information discussed with and copy given to patient by: Gretchen Short, 04/03/2023, 9:22 AM

## 2023-04-03 NOTE — Plan of Care (Signed)
  Problem: Consults Goal: RH STROKE PATIENT EDUCATION Description: See Patient Education module for education specifics  Outcome: Progressing   Problem: RH BOWEL ELIMINATION Goal: RH STG MANAGE BOWEL WITH ASSISTANCE Description: STG Manage Bowel with toileting Assistance. Outcome: Progressing   Problem: RH SAFETY Goal: RH STG ADHERE TO SAFETY PRECAUTIONS W/ASSISTANCE/DEVICE Description: STG Adhere to Safety Precautions With  cues Assistance/Device. Outcome: Progressing   Problem: RH KNOWLEDGE DEFICIT Goal: RH STG INCREASE KNOWLEDGE OF HYPERTENSION Description: Patient and spouse will be able to manage HTN using educational resources for medications and dietary modifications independently Outcome: Progressing Goal: RH STG INCREASE KNOWLEDGE OF STROKE PROPHYLAXIS Description: Patient and spouse will be able to manage secondary risks using educational resources for medications and dietary modifications independently Outcome: Progressing

## 2023-04-03 NOTE — Progress Notes (Signed)
 Speech Language Pathology Daily Session Note  Patient Details  Name: Jacqueline Bonilla MRN: [id number] Date of Birth: 07-23-1955  Today's Date: 04/03/2023 SLP Individual Time: 0800-0858 SLP Individual Time Calculation (min): 58 min  Short Term Goals: Week 1: SLP Short Term Goal 1 (Week 1): STG = LTG due to ELOS  Skilled Therapeutic Interventions: Skilled therapy session focused on cognitive goals. SLP facilitated session by providing minA during orientation task. Patient independently oriented to self, location and situation, though required minA to orient to correct age and time. SLP provided calendar as external aid to assist in orientation to time. SLP targeted problem solving and attention goals through compare/contrast task. Patient required minA to label how two items are alike and different. SLP continued to challenge patient through simple money management task. Patient required min-modA to count change according to verbalized amount. 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 Patient left in chair with alarm set and call bell in reach. Continue POC  Pain Denies  Therapy/Group: Individual Therapy  Natalia Wittmeyer M.A., CCC-SLP 04/03/2023, 7:44 AM

## 2023-04-03 NOTE — Progress Notes (Signed)
 Occupational Therapy Session Note  Patient Details  Name: Jacqueline Bonilla MRN: [id number] Date of Birth: [birth year]  Today's Date: 04/03/2023 OT Individual Time: 1107-1207 OT Individual Time Calculation (min): 60 min    Short Term Goals: Week 1:  OT Short Term Goal 1 (Week 1): STGs = LTGs  Skilled Therapeutic Interventions/Progress Updates:  Pt greeted seated in recliner, pt agreeable to OT intervention.      Transfers/bed mobility/functional mobility: pt completed all functional ambulation with no AD and CGA.   Therapeutic activity:   Pt completed various therapeutic activities focused on RUE coordination and visual perception.  -pt instructed to duplicate geometric structure from visual aid provided with pt needing step by step direct cues to problem solve placement of shapes. I.e "does the red one go on the L side or R side" with these cues pt completed structure with 90% accuracy.  -pt completed bimanual task of threading beads on string with pt picking up beads with RUE and holding the string in L hand. Pt needed + time and MAX cues to complete task. Pt presents as if she has perceptual deficits however when testing for perception deficits no deficits are noted however pt states " I can't feel it." Pt did best when R hand was holding the string and L hand manipulating beads -pt completed line bisection test with more of a focus on visual perceptual skills vs visual scanning. Pt with gross grasp on marker even with handle built up. Pt needed MAX cues for positioning to provide proximal support when completing task d/t incoordination, of note pt did skew all lines to L side -pt completed seated tracing task with pt instructed to draw a line to matching shapes with RUE, pt required R elbow to be propped up on table to complete but present with incoordination during task.  - pt completed standing bimanual task with pt instructed to fold wash cloths with a focus on bilateral integration. Pt  using RUE as more of a gross assist, all wash cloths folded messily but able to complete task with + time and supervision -graded task up and had pt fold/rip paper to challenge bimanual coordination to promote improved RUE motor planning.   Ended session with pt seated in recliner with all needs within reach and husband present.                  Therapy Documentation Precautions:  Precautions Precautions: Fall Precaution/Restrictions Comments: R hemi Restrictions Weight Bearing Restrictions Per Provider Order: No  Pain: no pain reported during session     Therapy/Group: Individual Therapy  Pollyann Glen [facility] 04/03/2023, 3:12 PM

## 2023-04-03 NOTE — Progress Notes (Signed)
 PROGRESS NOTE   Subjective/Complaints:  No issues overnite, no pains, ambulated to gym  ROS- no CP, SOB, N/V/D  Objective:   No results found. Recent Labs    04/02/23 0525  WBC 4.0  HGB 12.0  HCT 36.3  PLT 226   Recent Labs    04/02/23 0525  NA 138  K 4.0  CL 104  CO2 24  GLUCOSE 95  BUN 17  CREATININE 1.05*  CALCIUM 9.4    Intake/Output Summary (Last 24 hours) at 04/03/2023 0733 Last data filed at 04/02/2023 2102 Gross per 24 hour  Intake 840 ml  Output --  Net 840 ml        Physical Exam: Vital Signs Blood pressure 115/63, pulse 81, temperature 98.3 F (36.8 C), temperature source Oral, resp. rate 18, height 5\' 4"  (1.626 m), weight 51.7 kg, last menstrual period 01/02/2003, SpO2 98%.  General: No acute distress Mood and affect are appropriate Heart: Regular rate and rhythm no rubs murmurs or extra sounds Lungs: Clear to auscultation, breathing unlabored, no rales or wheezes Abdomen: Positive bowel sounds, soft nontender to palpation, nondistended Extremities: No clubbing, cyanosis, or edema Skin: No evidence of breakdown, no evidence of rash Neurologic: Cranial nerves II through XII intact, motor strength is 5/5 inLeft 4/5 RIght deltoid, bicep, tricep, grip, hip flexor, knee extensors, ankle dorsiflexor and plantar flexor Sensory exam normal sensation to light touch  Cerebellar exam mild dysmetria R FNF Musculoskeletal: Full range of motion in all 4 extremities. No joint swelling    Assessment/Plan: 1. Functional deficits which require 3+ hours per day of interdisciplinary therapy in a comprehensive inpatient rehab setting. Physiatrist is providing close team supervision and 24 hour management of active medical problems listed below. Physiatrist and rehab team continue to assess barriers to discharge/monitor patient progress toward functional and medical goals  Care Tool:  Bathing    Body  parts bathed by patient: Right arm, Left arm, Chest, Abdomen, Front perineal area, Buttocks, Right upper leg, Face, Left lower leg, Right lower leg, Left upper leg         Bathing assist Assist Level: Contact Guard/Touching assist     Upper Body Dressing/Undressing Upper body dressing   What is the patient wearing?: Bra, Pull over shirt    Upper body assist Assist Level: Minimal Assistance - Patient > 75%    Lower Body Dressing/Undressing Lower body dressing      What is the patient wearing?: Underwear/pull up, Pants     Lower body assist Assist for lower body dressing: Contact Guard/Touching assist     Toileting Toileting    Toileting assist Assist for toileting: Minimal Assistance - Patient > 75%     Transfers Chair/bed transfer  Transfers assist     Chair/bed transfer assist level: Minimal Assistance - Patient > 75%     Locomotion Ambulation   Ambulation assist      Assist level: Minimal Assistance - Patient > 75% Assistive device: Hand held assist Max distance: 110 ft   Walk 10 feet activity   Assist     Assist level: Minimal Assistance - Patient > 75% Assistive device: Hand held assist   Walk 50  feet activity   Assist    Assist level: Minimal Assistance - Patient > 75% Assistive device: Hand held assist    Walk 150 feet activity   Assist Walk 150 feet activity did not occur: Safety/medical concerns         Walk 10 feet on uneven surface  activity   Assist Walk 10 feet on uneven surfaces activity did not occur: Safety/medical concerns         Wheelchair     Assist Is the patient using a wheelchair?: Yes Type of Wheelchair: Manual    Wheelchair assist level: Total Assistance - Patient < 25% Max wheelchair distance: 115 ft    Wheelchair 50 feet with 2 turns activity    Assist        Assist Level: Total Assistance - Patient < 25%   Wheelchair 150 feet activity     Assist      Assist Level: Total  Assistance - Patient < 25%   Blood pressure 115/63, pulse 81, temperature 98.3 F (36.8 C), temperature source Oral, resp. rate 18, height 5\' 4"  (1.626 m), weight 51.7 kg, last menstrual period 01/02/2003, SpO2 98%.  Medical Problem List and Plan: 1. Functional deficits secondary to left MCA infarction status post TNK as well as history of left MCA infarction 2022 and received CIR             -patient may shower             -ELOS/Goals: 7 to 10 days supervision goals 2.  Antithrombotics: -DVT/anticoagulation:  Mechanical: Antiembolism stockings, thigh (TED hose) Bilateral lower extremities             -antiplatelet therapy: Aspirin 81 mg daily and Brilinta 90 mg twice daily x 4 weeks then aspirin alone.  Patient enrolled in [place] stroke research study 3. Pain Management: Tylenol as needed 4. Mood/Behavior/Sleep: Provide emotional support             -antipsychotic agents: N/A 5. Neuropsych/cognition: This patient is not capable of making decisions on her own behalf. 6. Skin/Wound Care: Routine skin checks 7. Fluids/Electrolytes/Nutrition: Routine in and outs with follow-up chemistries    Latest Ref Rng & Units 04/02/2023    5:25 AM 03/27/2023    9:53 AM 03/27/2023    9:47 AM  BMP  Glucose 70 - 99 mg/dL 95  952  841   BUN 8 - 23 mg/dL 17  21  20    Creatinine 0.44 - 1.00 mg/dL 3.24  4.01  0.27   Sodium 135 - 145 mmol/L 138  140  138   Potassium 3.5 - 5.1 mmol/L 4.0  4.2  4.2   Chloride 98 - 111 mmol/L 104  103  103   CO2 22 - 32 mmol/L 24   27   Calcium 8.9 - 10.3 mg/dL 9.4   9.6     8.  Hyperlipidemia.  Lipitor .9.  History of thrombocytopenia.  Latest platelet count 226,000    Latest Ref Rng & Units 04/02/2023    5:25 AM 03/27/2023    9:53 AM 03/27/2023    9:47 AM  CBC  WBC 4.0 - 10.5 K/uL 4.0   5.0   Hemoglobin 12.0 - 15.0 g/dL 25.3  66.4  40.3   Hematocrit 36.0 - 46.0 % 36.3  40.0  39.9   Platelets 150 - 400 K/uL 226   262     10.  Urine drug screen positive marijuana.   Counseling    LOS:  2 days A FACE TO FACE EVALUATION WAS PERFORMED  [name] 04/03/2023, 7:33 AM

## 2023-04-04 DIAGNOSIS — I69993 Ataxia following unspecified cerebrovascular disease: Secondary | ICD-10-CM | POA: Diagnosis not present

## 2023-04-04 DIAGNOSIS — I63512 Cerebral infarction due to unspecified occlusion or stenosis of left middle cerebral artery: Secondary | ICD-10-CM | POA: Diagnosis not present

## 2023-04-04 NOTE — Progress Notes (Signed)
 PROGRESS NOTE   Subjective/Complaints:  No issues overnite, slept well   ROS- no CP, SOB, N/V/D  Objective:   No results found. Recent Labs    04/02/23 0525  WBC 4.0  HGB 12.0  HCT 36.3  PLT 226   Recent Labs    04/02/23 0525  NA 138  K 4.0  CL 104  CO2 24  GLUCOSE 95  BUN 17  CREATININE 1.05*  CALCIUM 9.4    Intake/Output Summary (Last 24 hours) at 04/04/2023 0735 Last data filed at 04/03/2023 1830 Gross per 24 hour  Intake 540 ml  Output --  Net 540 ml        Physical Exam: Vital Signs Blood pressure 132/78, pulse 80, temperature 97.6 F (36.4 C), resp. rate 18, height 5\' 4"  (1.626 m), weight 51.7 kg, last menstrual period 01/02/2003, SpO2 97%.  General: No acute distress Mood and affect are appropriate Heart: Regular rate and rhythm no rubs murmurs or extra sounds Lungs: Clear to auscultation, breathing unlabored, no rales or wheezes Abdomen: Positive bowel sounds, soft nontender to palpation, nondistended Extremities: No clubbing, cyanosis, or edema Skin: No evidence of breakdown, no evidence of rash Neurologic: Cranial nerves II through XII intact, motor strength is 5/5 inLeft 4/5 RIght deltoid, bicep, tricep, grip, hip flexor, knee extensors, ankle dorsiflexor and plantar flexor Sensory exam normal sensation to light touch  Cerebellar exam mild dysmetria R FNF Musculoskeletal: Full range of motion in all 4 extremities. No joint swelling    Assessment/Plan: 1. Functional deficits which require 3+ hours per day of interdisciplinary therapy in a comprehensive inpatient rehab setting. Physiatrist is providing close team supervision and 24 hour management of active medical problems listed below. Physiatrist and rehab team continue to assess barriers to discharge/monitor patient progress toward functional and medical goals  Care Tool:  Bathing    Body parts bathed by patient: Right arm, Left  arm, Chest, Abdomen, Front perineal area, Buttocks, Right upper leg, Face, Left lower leg, Right lower leg, Left upper leg         Bathing assist Assist Level: Contact Guard/Touching assist     Upper Body Dressing/Undressing Upper body dressing   What is the patient wearing?: Bra, Pull over shirt    Upper body assist Assist Level: Minimal Assistance - Patient > 75%    Lower Body Dressing/Undressing Lower body dressing      What is the patient wearing?: Underwear/pull up, Pants     Lower body assist Assist for lower body dressing: Contact Guard/Touching assist     Toileting Toileting    Toileting assist Assist for toileting: Minimal Assistance - Patient > 75%     Transfers Chair/bed transfer  Transfers assist     Chair/bed transfer assist level: Minimal Assistance - Patient > 75%     Locomotion Ambulation   Ambulation assist      Assist level: Minimal Assistance - Patient > 75% Assistive device: Hand held assist Max distance: 110 ft   Walk 10 feet activity   Assist     Assist level: Minimal Assistance - Patient > 75% Assistive device: Hand held assist   Walk 50 feet activity   Assist  Assist level: Minimal Assistance - Patient > 75% Assistive device: Hand held assist    Walk 150 feet activity   Assist Walk 150 feet activity did not occur: Safety/medical concerns         Walk 10 feet on uneven surface  activity   Assist Walk 10 feet on uneven surfaces activity did not occur: Safety/medical concerns         Wheelchair     Assist Is the patient using a wheelchair?: Yes Type of Wheelchair: Manual    Wheelchair assist level: Total Assistance - Patient < 25% Max wheelchair distance: 115 ft    Wheelchair 50 feet with 2 turns activity    Assist        Assist Level: Total Assistance - Patient < 25%   Wheelchair 150 feet activity     Assist      Assist Level: Total Assistance - Patient < 25%   Blood  pressure 132/78, pulse 80, temperature 97.6 F (36.4 C), resp. rate 18, height 5\' 4"  (1.626 m), weight 51.7 kg, last menstrual period 01/02/2003, SpO2 97%.  Medical Problem List and Plan: 1. Functional deficits secondary to left MCA infarction status post TNK as well as history of left MCA infarction 2022 and received CIR             -patient may shower             -ELOS/Goals: 4/11 supervision goals 2.  Antithrombotics: -DVT/anticoagulation:  Mechanical: Antiembolism stockings, thigh (TED hose) Bilateral lower extremities             -antiplatelet therapy: Aspirin 81 mg daily and Brilinta 90 mg twice daily x 4 weeks then aspirin alone.  Patient enrolled in [place] stroke research study 3. Pain Management: Tylenol as needed 4. Mood/Behavior/Sleep: Provide emotional support             -antipsychotic agents: N/A 5. Neuropsych/cognition: This patient is not capable of making decisions on her own behalf. 6. Skin/Wound Care: Routine skin checks 7. Fluids/Electrolytes/Nutrition: Routine in and outs with follow-up chemistries    Latest Ref Rng & Units 04/02/2023    5:25 AM 03/27/2023    9:53 AM 03/27/2023    9:47 AM  BMP  Glucose 70 - 99 mg/dL 95  960  454   BUN 8 - 23 mg/dL 17  21  20    Creatinine 0.44 - 1.00 mg/dL 0.98  1.19  1.47   Sodium 135 - 145 mmol/L 138  140  138   Potassium 3.5 - 5.1 mmol/L 4.0  4.2  4.2   Chloride 98 - 111 mmol/L 104  103  103   CO2 22 - 32 mmol/L 24   27   Calcium 8.9 - 10.3 mg/dL 9.4   9.6     8.  Hyperlipidemia.  Lipitor .9.  History of thrombocytopenia.  Latest platelet count 226,000    Latest Ref Rng & Units 04/02/2023    5:25 AM 03/27/2023    9:53 AM 03/27/2023    9:47 AM  CBC  WBC 4.0 - 10.5 K/uL 4.0   5.0   Hemoglobin 12.0 - 15.0 g/dL 82.9  56.2  13.0   Hematocrit 36.0 - 46.0 % 36.3  40.0  39.9   Platelets 150 - 400 K/uL 226   262     10.  Urine drug screen positive marijuana.  Counseling    LOS: 3 days A FACE TO FACE EVALUATION WAS  PERFORMED  [name] 04/04/2023,  7:35 AM

## 2023-04-04 NOTE — Progress Notes (Signed)
 Physical Therapy Session Note  Patient Details  Name: Jacqueline Bonilla MRN: [id number] Date of Birth: [birth year]  Today's Date: 04/04/2023 PT Individual Time: 4098-1191 PT Individual Time Calculation (min): 45 min   Short Term Goals: Week 1:  PT Short Term Goal 1 (Week 1): STG = LTG d/t ELOS  Skilled Therapeutic Interventions/Progress Updates: Patient sitting in recliner on entrance to room. Patient alert and agreeable to PT session.   Patient reported unrated pain in L wrist (reported to rest of therapy team and nsg).  PTA, pt and pt husband discussed pt's familial help that can be provided with husband stating someone will be present with pt at all times during d/c (either himself or their son). Pt husband also provided pt history of pt having sequence/motor planning problems of R UE after 1st stroke, as well as short shuffle like pivot steps when pivoting or initiating movement.   Gait Training:  Pt ambulated from room<main gym<ortho gym<room (through sequence of session) with CGA/close supervision. Pt with shuffled gait to initiate movement, then takes smaller step lengths with continence of shuffled gait when passing through thresholds. Pt also with R UE in typical arm posture with cues to allow for reciprocal swing.   Neuromuscular Re-ed: - Pt cued to coordinate motor plan required to touch PTA tip of finger with R 2nd digit. Pt attempts to touch tip with L UE vs R and required mod cuing to switch UE to L. Pt also required increased time/effort to coordinate movement to tip of PTA finger, often times touching thumb vs 2nd digit.  BITS Bell Cancellation assessment (L brain affected)  - 2:37 min with 19 misses standing with close supervision and R UE use only. Pt with missed bells more on L side than R side.  NMR performed for improvements in motor control and coordination, balance, sequencing, judgement, and self confidence/ efficacy in performing all aspects of mobility at highest  level of independence.   Patient sitting in recliner at end of session with brakes locked, husband present, and all needs within reach.      Therapy Documentation Precautions:  Precautions Precautions: Fall Precaution/Restrictions Comments: R hemi Restrictions Weight Bearing Restrictions Per Provider Order: No   Therapy/Group: Individual Therapy  Kodee Drury PTA 04/04/2023, 4:39 PM

## 2023-04-04 NOTE — Plan of Care (Signed)
  Problem: Consults Goal: RH STROKE PATIENT EDUCATION Description: See Patient Education module for education specifics  Outcome: Progressing   Problem: RH BOWEL ELIMINATION Goal: RH STG MANAGE BOWEL WITH ASSISTANCE Description: STG Manage Bowel with toileting Assistance. Outcome: Progressing   Problem: RH SAFETY Goal: RH STG ADHERE TO SAFETY PRECAUTIONS W/ASSISTANCE/DEVICE Description: STG Adhere to Safety Precautions With  cues Assistance/Device. Outcome: Progressing

## 2023-04-04 NOTE — Plan of Care (Signed)
  Problem: Consults Goal: RH STROKE PATIENT EDUCATION Description: See Patient Education module for education specifics  Outcome: Progressing   Problem: RH BOWEL ELIMINATION Goal: RH STG MANAGE BOWEL WITH ASSISTANCE Description: STG Manage Bowel with toileting Assistance. Outcome: Progressing Goal: RH STG MANAGE BOWEL W/MEDICATION W/ASSISTANCE Description: STG Manage Bowel with Medication with mod I  Assistance. Outcome: Progressing   Problem: RH SAFETY Goal: RH STG ADHERE TO SAFETY PRECAUTIONS W/ASSISTANCE/DEVICE Description: STG Adhere to Safety Precautions With  cues Assistance/Device. Outcome: Progressing

## 2023-04-04 NOTE — Progress Notes (Signed)
 Occupational Therapy Session Note  Patient Details  Name: Jacqueline Bonilla MRN: [id number] Date of Birth: Feb 23, 1955  Today's Date: 04/04/2023 OT Individual Time: 0921-1005 OT Individual Time Calculation (min): 44 min    Short Term Goals: Week 1:  OT Short Term Goal 1 (Week 1): STGs = LTGs  Skilled Therapeutic Interventions/Progress Updates:  Pt greeted seated in recliner, pt agreeable to OT intervention.      Transfers/bed mobility/functional mobility:  Pt completed all functional ambulation with no AD and CGA, pt does present with festinating gait initially but then able to correct with increased time.    Therapeutic activity: pt completed dynamic standing balance task with pt given cone in R hand and instructed to ambulate while balancing bean bag on top of cone. Pt did drop cone one time and was unable to motor plan how to pick up cone with RUE. Pt required CGA- MIN A for functional ambulation.    ADLs:  Grooming: pt completed standing oral care at sink with + time to motor plan and problem solve how to apply paste to brush.  UB dressing:pt donned [place] shirt with + time and set- up assist with direct prompts for problem solving such as "which side is the front vs back?"  LB dressing: pt donned pants from EOB with MINa to thread RLE into pants first.  Footwear: pt donned slide on shoes with set- up assist  Transfers: ambulatory toilet transfer with no AD and CGA Toileting: pt with continent urine void with pt able to complete 3/3 toileting tasks with supervision.   Ended session with pt seated in recliner with all needs within reach, pt declined use of safety belt.                 Therapy Documentation Precautions:  Precautions Precautions: Fall Precaution/Restrictions Comments: R hemi Restrictions Weight Bearing Restrictions Per Provider Order: No  Pain:no pain reported during session    Therapy/Group: Individual Therapy  Pollyann Glen [facility] 04/04/2023, 11:56 AM

## 2023-04-04 NOTE — Group Note (Signed)
 Patient Details Name: PABLO MATHURIN MRN: [id number] DOB: 1955-02-06 Today's Date: 04/04/2023  Time Calculation: OT Group Time Calculation OT Group Start Time: 1430 OT Group Stop Time: 1530 OT Group Time Calculation (min): 60 min      Group Description: Dance Group: Pt participated in dance group with an emphasis on social interaction, motor planning, increasing overall activity tolerance and bimanual tasks. All songs were selected by group members. Dance moves included AROM of BUE/BLE gross motor movements with an emphasis on building functional endurance.    Individual level documentation: Patient completed group from sitting level. Patientt needed supervision to complete various dance moves with OT providing visual model. Patient able to create her own modifications during group.  Pain: Pain Assessment Pain Scale: 0-10 Pain Score: 0-No pain  Precautions:  Falls  Tollie Pizza Woodson 04/04/2023, 3:45 PM

## 2023-04-04 NOTE — Progress Notes (Signed)
 Patient ID: Jacqueline Bonilla, female   DOB: 06/08/1955, 68 y.o.   MRN: [id number]  1139- SW spoke with pt husband Billey Gosling to provide updates from team conference, d/c date 4/11 and discussed family edu. Fam edu on Tuesday 9am-12pmCecile Sheerer, MSW, LCSW Office: [phone] Cell: [phone] Fax: [phone]

## 2023-04-04 NOTE — Progress Notes (Signed)
 Inpatient Rehabilitation Care Coordinator Assessment and Plan Patient Details  Name: Jacqueline Bonilla MRN: [id number] Date of Birth: 01/19/1955  Today's Date: 04/04/2023  Hospital Problems: Active Problems:   Left middle cerebral artery stroke [facility])  Past Medical History:  Past Medical History:  Diagnosis Date   Anemia    Anxiety    Depression    Dysmenorrhea    Endometriosis    Fibroid    Hypertension    Implantable loop recorder present 2021   Microhematuria    negative workup   Osteoporosis    Tachycardia    Thrombocytopenia (HCC)    Past Surgical History:  Past Surgical History:  Procedure Laterality Date   BREAST BIOPSY     BUBBLE STUDY  12/08/2020   Procedure: BUBBLE STUDY;  Surgeon: Wendall Stade, MD;  Location: [facility] ENDOSCOPY;  Service: Cardiovascular;;   CESAREAN SECTION     hysteroscopic resection     implantable loop recorder implant  10/21/2019   Medtronic Reveal Linq model LNQ 22 ([place] [id number]) implantable loop recorder   IR CT HEAD LTD  12/01/2020   IR PERCUTANEOUS ART THROMBECTOMY/INFUSION INTRACRANIAL INC DIAG ANGIO  12/01/2020   IR US GUIDE VASC ACCESS RIGHT  12/01/2020   ORIF HUMERUS FRACTURE Left 07/31/2021   Procedure: OPEN REDUCTION INTERNAL FIXATION (ORIF) DISTAL HUMERUS FRACTURE;  Surgeon: Roby Lofts, MD;  Location: [facility] OR;  Service: Orthopedics;  Laterality: Left;   RADIOLOGY WITH ANESTHESIA N/A 12/01/2020   Procedure: IR WITH ANESTHESIA - CODE STROKE;  Surgeon: Radiologist, Medication, MD;  Location: [facility] OR;  Service: Radiology;  Laterality: N/A;   TEE WITHOUT CARDIOVERSION N/A 12/08/2020   Procedure: TRANSESOPHAGEAL ECHOCARDIOGRAM (TEE);  Surgeon: Wendall Stade, MD;  Location: [facility] ENDOSCOPY;  Service: Cardiovascular;  Laterality: N/A;   Social History:  reports that she has never smoked. She has never used smokeless tobacco. She reports that she does not drink alcohol and does not use drugs.  Family / Support Systems Marital Status:  Married How Long?: 49 years Patient Roles: Spouse, Parent Spouse/Significant Other: [name] (husband) Children: Blended family; THye share one son together- Gerri Spore (lives in [place]), and [place] Other Supports: none Anticipated Caregiver: husbnad Ability/Limitations of Caregiver: Husband and son work together and he works majority of the day per pt reports. Pt will need to be as independent as possible. Caregiver Availability: Intermittent Family Dynamics: Pt lives with her husband.  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 Education: college Health Literacy - How often do you need to have someone help you when you read instructions, pamphlets, or other written material from your doctor or pharmacy?: Never Writes: Yes Employment Status: Retired Marine scientist Issues: Denies Guardian/Conservator: Pt is unsure if they have an HCPOA   Abuse/Neglect Abuse/Neglect Assessment Can Be Completed: Yes Physical Abuse: Denies Verbal Abuse: Denies Sexual Abuse: Denies Exploitation of patient/patient's resources: Denies Self-Neglect: Denies  Patient response to: Social Isolation - How often do you feel lonely or isolated from those around you?: Rarely  Emotional Status Pt's affect, behavior and adjustment status: Pt in good spirits at time of visit. She was calm and some confusion at times. Recent Psychosocial Issues: Denies Psychiatric History: Denies Substance Abuse History: Denies  Patient / Family Perceptions, Expectations & Goals Pt/Family understanding of illness & functional limitations: Pt has general understanding of care needs Premorbid pt/family roles/activities: Independent Anticipated changes in  roles/activities/participation: Assistance with ADLs/IADLs Pt/family expectations/goals: pt goal is ot work on "getting my  strength back"  [employer]: None Premorbid Home Care/DME Agencies: None Transportation available at discharge: TBD Is the patient able to respond to transportation needs?: Yes In the past 12 months, has lack of transportation kept you from medical appointments or from getting medications?: No In the past 12 months, has lack of transportation kept you from meetings, work, or from getting things needed for daily living?: No Resource referrals recommended: Neuropsychology  Discharge Planning Living Arrangements: Spouse/significant other Support Systems: Spouse/significant other, Children Type of Residence: Private residence Insurance Resources: Media planner (specify) Multimedia programmer Medicare) Financial Resources: Restaurant manager, fast food Screen Referred: No Living Expenses: Banker Management: Spouse Does the patient have any problems obtaining your medications?: No Home Management: Pt reports her husband manages all home care needs Patient/Family Preliminary Plans: No changes Care Coordinator Barriers to Discharge: Decreased caregiver support, Lack of/limited family support, Insurance for SNF coverage Care Coordinator Anticipated Follow Up Needs: HH/OP Expected length of stay: D/c 4/11  Clinical Impression SW met with pt in room to introduce self, explain role, discuss discharge process, and inform on d/c date 4/11. Pt is not a Cytogeneticist. DME: ?Cane, and RW. Pt aware SW will follow-up with her husband.   1630- SW called pt husband however he was taking pt outside. SW will follow-up with updates.   Alesandro Stueve A Rickell Wiehe 04/04/2023, 12:09 PM

## 2023-04-04 NOTE — Patient Care Conference (Signed)
 Inpatient RehabilitationTeam Conference and Plan of Care Update Date: 04/03/2023   Time: 10:06 AM    Patient Name: Jacqueline Bonilla      Medical Record Number: [id number]  Date of Birth: 1955-02-01 Sex: Female         Room/Bed: 4M04C/4M04C-01 Payor Info: Payor: HUMANA MEDICARE / Plan: HUMANA MEDICARE HMO / Product Type: *No Product type* /    Admit Date/Time:  04/01/2023  3:02 PM  Primary Diagnosis:  <principal problem not specified>  Hospital Problems: Active Problems:   Left middle cerebral artery stroke [facility])    Expected Discharge Date: Expected Discharge Date: 04/12/23  Team Members Present: Physician leading conference: Dr. Claudette Laws Social Worker Present: Cecile Sheerer, LCSWA Nurse Present: Chana Bode, RN PT Present: Wynelle Link, PT;Dominic Sandoval, PTA OT Present: Mariann Barter, OT SLP Present: Everardo Pacific, SLP PPS Coordinator present : Fae Pippin, SLP     Current Status/Progress Goal Weekly Team Focus  Bowel/Bladder   Continent of bowel and bladder. LBM 3/31   Remain free from S/S of constipation.   Offer toileting q 2 hours durign day and q4 at hs.    Swallow/Nutrition/ Hydration   regular/thin   supervision  tolerance of thin liquids, ? need for MBS    ADL's   min A overall with cognitive/ perceptual/RUE deficits.  pt demonstrating impaired motor planning, impaired use of RUE.   supervision   ADL training, cognitive and perceptual training, balance, pt/fam education    Mobility   Bed mobility - supervision/CGA; Transfer - minA; Gait - CGA/minA; Stairs - minA   Supervision for transfers, CGA ambulation  Barriers - assistance at home? Vision cut? Weekly Focus - NMRE R, family/pt ed., dynamic standing balance, coordination, gait    Communication   chart notes occasional instances of anomia, likely residual from prior L CVAs. None observed on evaluation - patient reports this is a rare occurance            Safety/Cognition/  Behavioral Observations  modA for STM/LTM, basic problem solving, emergent awareness   minA   education and completion of functional memory, problem solving, attention, awareness activities    Pain   Denies pain.   Pt state pain lvel <3 out of 10   Assess for pain q shift and PRN.    Skin   Skin intact.   Remain free from s/s of skin breakdown  assess skin q shift and PRN.      Discharge Planning:  TBA. Per EMR, pt to d/c to home with her husband. SW will confirm there are no barriers to discharge.   Team Discussion: Patient admitted post left MCA CVA with right upper extremity limitations, dynamic gait issues, motor planning deficits and global cognitive issues.  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  Patient on target to meet rehab goals: yes, currently needs supervision - CGA for ambulation with a shuffled gait. Needs min assist for steps.  Goals for discharge set for supervision overall.  *See Care Plan and progress notes for long and short-term goals.   Revisions to Treatment Plan:  MBS pending   Teaching Needs: Safety, medications, transfers, toileting, etc.   Current Barriers to Discharge: Decreased caregiver support and Home enviroment access/layout  Possible Resolutions to Barriers: Family education     Medical Summary Current Status: No pain complaints, speech slowly improving  Barriers to Discharge: Other (comments)  Barriers to Discharge Comments: Reduced safety awareness Possible Resolutions to Barriers/Weekly Focus:  Work on fall risk, anticipate discharge in the next couple days, family training will need to be completed.   Continued Need for Acute Rehabilitation Level of Care: The patient requires daily medical management by a physician with specialized training in physical medicine and rehabilitation for the following reasons: Direction of a multidisciplinary physical rehabilitation program to  maximize functional independence : Yes Medical management of patient stability for increased activity during participation in an intensive rehabilitation regime.: Yes Analysis of laboratory values and/or radiology reports with any subsequent need for medication adjustment and/or medical intervention. : Yes   I attest that I was present, lead the team conference, and concur with the assessment and plan of the team.   Chana Bode B 04/04/2023, 3:22 PM

## 2023-04-04 NOTE — Progress Notes (Signed)
 Speech Language Pathology Daily Session Note  Patient Details  Name: JANIYLAH HANNIS MRN: [id number] Date of Birth: December 23, 1955  Today's Date: 04/04/2023 SLP Individual Time: 0803-0900 SLP Individual Time Calculation (min): 57 min  Short Term Goals: Week 1: SLP Short Term Goal 1 (Week 1): STG = LTG due to ELOS  Skilled Therapeutic Interventions: Skilled therapy session focused on cognitive goals. SLP facilitated session by providing minA during orientation task. Patient independently oriented to self, situation, and location, however required minA and external aid to orient to time. SLP targeted problem solving goals through prompting patient to identify safety hazards in pictures. Patient did so with supervision, occasionally minA. Patient then interpreted daily schedule with minA, recalling activities planned for remainder of the day. Patient left in chair with alarm set and call bell in reach. Continue POC  Pain Denies  Therapy/Group: Individual Therapy  Clifton Safley M.A., CCC-SLP 04/04/2023, 7:40 AM

## 2023-04-05 ENCOUNTER — Ambulatory Visit (HOSPITAL_COMMUNITY)

## 2023-04-05 DIAGNOSIS — I63512 Cerebral infarction due to unspecified occlusion or stenosis of left middle cerebral artery: Secondary | ICD-10-CM | POA: Diagnosis not present

## 2023-04-05 NOTE — Plan of Care (Signed)
  Problem: Consults Goal: RH STROKE PATIENT EDUCATION Description: See Patient Education module for education specifics  Outcome: Progressing   Problem: RH BOWEL ELIMINATION Goal: RH STG MANAGE BOWEL WITH ASSISTANCE Description: STG Manage Bowel with toileting Assistance. Outcome: Progressing   Problem: RH SAFETY Goal: RH STG ADHERE TO SAFETY PRECAUTIONS W/ASSISTANCE/DEVICE Description: STG Adhere to Safety Precautions With  cues Assistance/Device. Outcome: Progressing

## 2023-04-05 NOTE — Progress Notes (Signed)
 Speech Language Pathology Daily Session Note  Patient Details  Name: MARESSA APOLLO MRN: [id number] Date of Birth: 1955/05/23  Today's Date: 04/05/2023 SLP Individual Time: 4098-1191 SLP Individual Time Calculation (min): 44 min  Short Term Goals: Week 1: SLP Short Term Goal 1 (Week 1): STG = LTG due to ELOS  Skilled Therapeutic Interventions: Skilled therapy session focused on cognitive goals. SLP facilitated session by prompting patient to complete orientation task. Patient independently oriented to self, situation, and location and utilized phone to recall time. Patient then required minA to utilize written schedule to answer comprehension questions regarding todays therapy sessions. SLP continued to challenge patients problem solving and attention skills through basic medication management task. SLP provided min-modA for patient to place medications in BID pillbox according to verbalized and written directions. Patient was able to complete task with extra time, though largest barrier was visual perception. SLP educated patient on importance of utilizing pillbox upon d/c to aid in planning, organization and memory. Patient verbalized understanding. Patient left in chair with alarm set and call bell in reach. Continue POC.  Pain Denies  Therapy/Group: Individual Therapy  Dacia Capers M.A., CCC-SLP 04/05/2023, 7:42 AM

## 2023-04-05 NOTE — Progress Notes (Signed)
 Physical Therapy Session Note  Patient Details  Name: Jacqueline Bonilla MRN: [id number] Date of Birth: 03-Nov-1955  Today's Date: 04/05/2023 PT Individual Time: 1140-1205; 1309 - 1404 PT Individual Time Calculation (min): 25 min   Short Term Goals: Week 1:  PT Short Term Goal 1 (Week 1): STG = LTG d/t ELOS  SESSION 1 Skilled Therapeutic Interventions/Progress Updates: Patient sitting in recliner on entrance to room. Patient alert and agreeable to PT session.   Patient reported no pain. Pt with increase in R lean this session vs previous session with this PTA. Pt and pt husband reported this increased with fatigue. Pt ambulated from room<>main gym with CGA and VC to reach for PTA hand on L side to promote centered posture. Pt in main gym with instructions to lean into wall softly with towel between L UE and wall with cues to avoid letting towel fall. Pt performed 5 min with mod cuing to maintain towel on wall. Pt with red wedge underneath L glute to augment R lean error with cues for pt to maintain shift to center for roughly 5 min, then cued to reach across midline to PTA hand, and to lean on PTA hand with R elbow with cues to use L lateral trunk flexors to bring back to center (CGA). Pt progressed to blue wedge with pt reporting midline to be off center, and that sitting upright feels "weird."  Patient sitting in recliner at end of session with brakes locked, husband present, belt alarm set, and all needs within reach.  SESSION 2 Skilled Therapeutic Interventions/Progress Updates: Patient sitting in recliner on entrance to room. Patient alert and agreeable to PT session.   Patient reported no pain at beginning, but some pain/tightness on R hip during session (manual therapy/therex to assist with pt reporting decreased in pain)  Therapeutic Activity: Bed Mobility: Pt performed sit<supine<L sidelying<supine<sit with supervision and VC for technique to bridge self over to L side.  Gait  Training:  Pt ambulated from room<>main gym using no AD with light CGA. Pt with R lean with VC to reach for PTA hand on L to improve centered posture. Pt with improved posture on way back to room following NMRE. Pt also cued to allow R LE to swing naturally during gait cycle per typical arm posture presentation.   Neuromuscular Re-ed: NMR facilitated during session with focus on dynamic sitting balance, postural control, midline orientation. - Pt sitting on wedge underneath L glute to augment R lean error (blue wedge) for 3 min with mirror feedback. Pt then cued to lean to L to pick up pong ball on top of cone, and to stack ontop of cone to the R with cues to use core musculature to maintain dynamic sitting balance. Pt required CGA/close supervision for safety. Pt required increased effort and VC to coordinate placement of ball on small top of cone.  NMR performed for improvements in motor control and coordination, balance, sequencing, judgement, and self confidence/ efficacy in performing all aspects of mobility at highest level of independence.   Therapeutic Exercise: Pt performed the following exercises with therapist providing the described cuing and facilitation for improvement. - Piriformis stretch supine on mat 3 x 15 seconds - Supine bridges with yellow theraband around B knees to further engage glute external rotators. 2 x 12 - Supine clam shells x 10 with yellow theraband  Manual Therapy: Palpation of L glute (piriformis) performed with trigger points noted. Education and rationale provided with pt agreeing to participate in intervention. -  Trigger point release to stated area with soft tissue mobilization to follow throughout. Therex to follow.   Patient sitting in recliner at end of session with brakes locked, chair alarm set, and all needs within reach.       Therapy Documentation Precautions:  Precautions Precautions: Fall Precaution/Restrictions Comments: R  hemi Restrictions Weight Bearing Restrictions Per Provider Order: No   Therapy/Group: Individual Therapy  Shimshon Narula PTA 04/05/2023, 3:59 PM

## 2023-04-05 NOTE — Progress Notes (Signed)
 PROGRESS NOTE   Subjective/Complaints:  Using RUE to feed herself , no pain c/os  ROS- no CP, SOB, N/V/D  Objective:   No results found. No results for input(s): "WBC", "HGB", "HCT", "PLT" in the last 72 hours.  No results for input(s): "NA", "K", "CL", "CO2", "GLUCOSE", "BUN", "CREATININE", "CALCIUM" in the last 72 hours.   Intake/Output Summary (Last 24 hours) at 04/05/2023 0656 Last data filed at 04/04/2023 1305 Gross per 24 hour  Intake 476 ml  Output --  Net 476 ml        Physical Exam: Vital Signs Blood pressure 124/80, pulse 78, temperature 97.8 F (36.6 C), temperature source Oral, resp. rate 16, height 5\' 4"  (1.626 m), weight 51.7 kg, last menstrual period 01/02/2003, SpO2 97%.  General: No acute distress Mood and affect are appropriate Heart: Regular rate and rhythm no rubs murmurs or extra sounds Lungs: Clear to auscultation, breathing unlabored, no rales or wheezes Abdomen: Positive bowel sounds, soft nontender to palpation, nondistended Extremities: No clubbing, cyanosis, or edema Skin: No evidence of breakdown, no evidence of rash Neurologic: Cranial nerves II through XII intact, motor strength is 5/5 inLeft 4/5 RIght deltoid, bicep, tricep, grip, hip flexor, knee extensors, ankle dorsiflexor and plantar flexor Sensory exam normal sensation to light touch  Cerebellar exam mild dysmetria R FNF Musculoskeletal: Full range of motion in all 4 extremities. No joint swelling    Assessment/Plan: 1. Functional deficits which require 3+ hours per day of interdisciplinary therapy in a comprehensive inpatient rehab setting. Physiatrist is providing close team supervision and 24 hour management of active medical problems listed below. Physiatrist and rehab team continue to assess barriers to discharge/monitor patient progress toward functional and medical goals  Care Tool:  Bathing    Body parts bathed by  patient: Right arm, Left arm, Chest, Abdomen, Front perineal area, Buttocks, Right upper leg, Face, Left lower leg, Right lower leg, Left upper leg         Bathing assist Assist Level: Contact Guard/Touching assist     Upper Body Dressing/Undressing Upper body dressing   What is the patient wearing?: Pull over shirt    Upper body assist Assist Level: Set up assist    Lower Body Dressing/Undressing Lower body dressing      What is the patient wearing?: Underwear/pull up, Pants     Lower body assist Assist for lower body dressing: Maximal Assistance - Patient 25 - 49%     Toileting Toileting    Toileting assist Assist for toileting: Supervision/Verbal cueing     Transfers Chair/bed transfer  Transfers assist     Chair/bed transfer assist level: Minimal Assistance - Patient > 75%     Locomotion Ambulation   Ambulation assist      Assist level: Minimal Assistance - Patient > 75% Assistive device: Hand held assist Max distance: 110 ft   Walk 10 feet activity   Assist     Assist level: Minimal Assistance - Patient > 75% Assistive device: Hand held assist   Walk 50 feet activity   Assist    Assist level: Minimal Assistance - Patient > 75% Assistive device: Hand held assist    Walk  150 feet activity   Assist Walk 150 feet activity did not occur: Safety/medical concerns         Walk 10 feet on uneven surface  activity   Assist Walk 10 feet on uneven surfaces activity did not occur: Safety/medical concerns         Wheelchair     Assist Is the patient using a wheelchair?: Yes Type of Wheelchair: Manual    Wheelchair assist level: Total Assistance - Patient < 25% Max wheelchair distance: 115 ft    Wheelchair 50 feet with 2 turns activity    Assist        Assist Level: Total Assistance - Patient < 25%   Wheelchair 150 feet activity     Assist      Assist Level: Total Assistance - Patient < 25%   Blood  pressure 124/80, pulse 78, temperature 97.8 F (36.6 C), temperature source Oral, resp. rate 16, height 5\' 4"  (1.626 m), weight 51.7 kg, last menstrual period 01/02/2003, SpO2 97%.  Medical Problem List and Plan: 1. Functional deficits secondary to left MCA infarction status post TNK as well as history of left MCA infarction 2022 and received CIR             -patient may shower             -ELOS/Goals: 4/11 supervision goals 2.  Antithrombotics: -DVT/anticoagulation:  Mechanical: Antiembolism stockings, thigh (TED hose) Bilateral lower extremities             -antiplatelet therapy: Aspirin 81 mg daily and Brilinta 90 mg twice daily x 4 weeks then aspirin alone.  Patient enrolled in [place] stroke research study 3. Pain Management: Tylenol as needed 4. Mood/Behavior/Sleep: Provide emotional support             -antipsychotic agents: N/A 5. Neuropsych/cognition: This patient is not capable of making decisions on her own behalf. 6. Skin/Wound Care: Routine skin checks 7. Fluids/Electrolytes/Nutrition: Routine in and outs with follow-up chemistries    Latest Ref Rng & Units 04/02/2023    5:25 AM 03/27/2023    9:53 AM 03/27/2023    9:47 AM  BMP  Glucose 70 - 99 mg/dL 95  098  119   BUN 8 - 23 mg/dL 17  21  20    Creatinine 0.44 - 1.00 mg/dL 1.47  8.29  5.62   Sodium 135 - 145 mmol/L 138  140  138   Potassium 3.5 - 5.1 mmol/L 4.0  4.2  4.2   Chloride 98 - 111 mmol/L 104  103  103   CO2 22 - 32 mmol/L 24   27   Calcium 8.9 - 10.3 mg/dL 9.4   9.6     8.  Hyperlipidemia.  Lipitor .9.  History of thrombocytopenia.  Latest platelet count 226,000    Latest Ref Rng & Units 04/02/2023    5:25 AM 03/27/2023    9:53 AM 03/27/2023    9:47 AM  CBC  WBC 4.0 - 10.5 K/uL 4.0   5.0   Hemoglobin 12.0 - 15.0 g/dL 13.0  86.5  78.4   Hematocrit 36.0 - 46.0 % 36.3  40.0  39.9   Platelets 150 - 400 K/uL 226   262     10.  Urine drug screen positive marijuana.  Counseling    LOS: 4 days A FACE TO FACE  EVALUATION WAS PERFORMED  [name] 04/05/2023, 6:56 AM

## 2023-04-05 NOTE — Progress Notes (Signed)
 Occupational Therapy Session Note  Patient Details  Name: Jacqueline Bonilla MRN: [id number] Date of Birth: 1955/02/21  Today's Date: 04/05/2023 OT Individual Time: 4098-1191 OT Individual Time Calculation (min): 75 min    Short Term Goals: Week 1:  OT Short Term Goal 1 (Week 1): STGs = LTGs  Skilled Therapeutic Interventions/Progress Updates:    Pt received in recliner and agreeable to a shower.  Had pt work on warm up BUE AROM for bilateral coordination. Pt leaning to her R in recliner. Had pt work on active reach to L and then return to midline but she had difficulty finding midline. She is able to actively reach and move R arm, but has great difficulty integrating it into function.  Attempted to have her use it to push up to stand.  She was able to press through hand for repeated sit to squats.  Ambulated to toilet and then shower CGA.  Multiple cues for slow processing and limited problem solving, sequencing, initiation with self care.  Dressing required mod cues for attention to R side and spatial orientation of the clothing.  In standing at the sink pt leaning to the right and when I manually corrected her trunk alignment she said she felt that she was "twisted".  She had difficulty location faucet handles and processing with completing oral care.  Ambulated in hallway with R side along wall for perceptual feedback but that was not very effective.  Instead had place L hand on wall and work on reaching up the wall until her torso was in midline and try to hold that position.     Pt returned to room. Pt resting in recliner with all needs met. Alarm set and call light in reach.    Therapy Documentation Precautions:  Precautions Precautions: Fall Precaution/Restrictions Comments: R hemi Restrictions Weight Bearing Restrictions Per Provider Order: No   Pain: Pain Assessment Pain Scale: 0-10 Pain Score: 0-No pain ADL: ADL Eating: Set up Grooming: Supervision/safety Upper Body  Bathing: Supervision/safety, Moderate cueing Where Assessed-Upper Body Bathing: Shower Lower Body Bathing: Minimal assistance, Moderate cueing Where Assessed-Lower Body Bathing: Shower Upper Body Dressing: Minimal assistance, Moderate cueing Where Assessed-Upper Body Dressing: Edge of bed Lower Body Dressing: Minimal assistance, Moderate cueing Where Assessed-Lower Body Dressing: Edge of bed Toileting: Minimal assistance Where Assessed-Toileting: Teacher, adult education: Curator Method: Proofreader: Acupuncturist: Insurance underwriter Method: Designer, industrial/product: Grab bars, Shower seat with back   Therapy/Group: Individual Therapy  Json Koelzer 04/05/2023, 12:23 PM

## 2023-04-05 NOTE — Plan of Care (Signed)
  Problem: Consults Goal: RH STROKE PATIENT EDUCATION Description: See Patient Education module for education specifics  Outcome: Progressing   Problem: RH BOWEL ELIMINATION Goal: RH STG MANAGE BOWEL WITH ASSISTANCE Description: STG Manage Bowel with toileting Assistance. Outcome: Progressing Goal: RH STG MANAGE BOWEL W/MEDICATION W/ASSISTANCE Description: STG Manage Bowel with Medication with mod I  Assistance. Outcome: Progressing   Problem: RH SAFETY Goal: RH STG ADHERE TO SAFETY PRECAUTIONS W/ASSISTANCE/DEVICE Description: STG Adhere to Safety Precautions With  cues Assistance/Device. Outcome: Progressing   Problem: RH KNOWLEDGE DEFICIT Goal: RH STG INCREASE KNOWLEDGE OF HYPERTENSION Description: Patient and spouse will be able to manage HTN using educational resources for medications and dietary modifications independently Outcome: Progressing Goal: RH STG INCREASE KNOWLEGDE OF HYPERLIPIDEMIA Description: Patient and spouse will be able to manage HLD using educational resources for medications and dietary modifications independently Outcome: Progressing Goal: RH STG INCREASE KNOWLEDGE OF STROKE PROPHYLAXIS Description: Patient and spouse will be able to manage secondary risks using educational resources for medications and dietary modifications independently Outcome: Progressing

## 2023-04-06 ENCOUNTER — Other Ambulatory Visit (HOSPITAL_COMMUNITY): Payer: Self-pay

## 2023-04-06 DIAGNOSIS — K5900 Constipation, unspecified: Secondary | ICD-10-CM

## 2023-04-06 DIAGNOSIS — I63512 Cerebral infarction due to unspecified occlusion or stenosis of left middle cerebral artery: Secondary | ICD-10-CM | POA: Diagnosis not present

## 2023-04-06 NOTE — Progress Notes (Signed)
 Occupational Therapy Session Note  Patient Details  Name: TAELER WINNING MRN: [id number] Date of Birth: 02/22/1955  Today's Date: 04/06/2023 OT Individual Time: 0900-1015 OT Individual Time Calculation (min): 75 min    Short Term Goals: Week 1:  OT Short Term Goal 1 (Week 1): STGs = LTGs  Skilled Therapeutic Interventions/Progress Updates:    Patient seated in recliner at the time of OT treatment. Patient in agreement with completing BADL related task in bathing at sink LOF. Patient indicated that she had no pain to report and that she rested well during the night. The pt was in good spirits and was very receptive to treatment. Patient required heavy cues in relation to effective carryover secondary to cognitive challenges with following the step for  bathing UB. The pt was able to implement UB bathing with constant cue.  The pt was able to donn her bra and over head shirt with MinA. The pt was able to come from sit to stand incorporating the sink and requiring MinA. The pt was able to doff her LB garments, panties and pants with CGA, she was able to  bathe her LB with s/uA  The pt was able to donn her underwear and pants over her bottom and hips with CGA.  The pt went on to brush her teeth with s/uA and vc's. The MD came in during the session and was able to inquire of the patient regarding her current status. The pt went on to tolerate manual manipulation of the scapular and surrounding structures for good anatomical positioning and symmetry of the L and RUE's.  The pt was then instructed to thread bilateral hands and take them into shld flexion 10 times with rest breaks as needed.   The pt inidcated that she needed to go to the restroom.  The pt was transported to the restroom  and was able to incorporate the grab bars for coming from sit to stand  with CGA. The pt  went on to doff her LB garments at [facility] and was able to lower herself to the commode at the same LOF.  The pt  was able to complete  pericare with CGA and she was able to donn her LB garments at the same LOF.  The pt was transported to the sink area and was able to wash her hands with s/uA. The pt went on to ambulate to the recliner for placement with CGA. The call light and bedside table were placed within reach with all additional needs addressed.  Therapy Documentation Precautions:  Precautions Precautions: Fall Precaution/Restrictions Comments: R hemi Restrictions Weight Bearing Restrictions Per Provider Order: No  Therapy/Group: Individual Therapy  Lavona Mound 04/06/2023, 4:50 PM

## 2023-04-06 NOTE — Plan of Care (Signed)
  Problem: Consults Goal: RH STROKE PATIENT EDUCATION Description: See Patient Education module for education specifics  Outcome: Progressing   Problem: RH BOWEL ELIMINATION Goal: RH STG MANAGE BOWEL WITH ASSISTANCE Description: STG Manage Bowel with toileting Assistance. Outcome: Progressing Goal: RH STG MANAGE BOWEL W/MEDICATION W/ASSISTANCE Description: STG Manage Bowel with Medication with mod I  Assistance. Outcome: Progressing   Problem: RH SAFETY Goal: RH STG ADHERE TO SAFETY PRECAUTIONS W/ASSISTANCE/DEVICE Description: STG Adhere to Safety Precautions With  cues Assistance/Device. Outcome: Progressing

## 2023-04-06 NOTE — Progress Notes (Signed)
 Speech Language Pathology Daily Session Note  Patient Details  Name: TYLA BURGNER MRN: [id number] Date of Birth: 03/17/1955  Today's Date: 04/06/2023 SLP Individual Time: 1431-1530 SLP Individual Time Calculation (min): 59 min  Short Term Goals: Week 1: SLP Short Term Goal 1 (Week 1): STG = LTG due to ELOS  Skilled Therapeutic Interventions:  Patient was seen in PM to address cognitive re- training. Pt was alert and seated upright in recliner upon SLP arrival. She was agreeable for session. Pt able to verbalize recent medical hx along with PLOF. She demonstrated awareness of limitations c/b stating, "my processing and sequencing are off." Pt oriented to self, situation, and environment. She initially warranted mod A for orientation to time with cues able to be faded to min A with use of external aid. SLP introduced WRAP memory strategies along with implementing memory book. Pt reports difficulty with fine motor skills as well as writing however, believes having information written will facilitate recall. SLP educated and subsequently challenged in recall of BE FAST stroke symptoms. After a distracted delay, pt recalled information with mod A. SLP addressed sequencing through presentation of ADLS visually and challenging pt to sequence steps from 1-4. Pt completed task with sup to min A and additional time. After completion, pt reported, "that was hard." Pt also challenged in a money managemnt task through identifying amount of money pictured. Pt warranting mod to max consistent cues. At conclusion of session pt was left upright in recliner with call button within reach. SLP to continue POC.   Pain Pain Assessment Pain Scale: 0-10 Pain Score: 0-No pain  Therapy/Group: Individual Therapy  Renaee Munda 04/06/2023, 3:48 PM

## 2023-04-06 NOTE — Plan of Care (Signed)
  Problem: Consults Goal: RH STROKE PATIENT EDUCATION Description: See Patient Education module for education specifics  Outcome: Progressing   Problem: RH BOWEL ELIMINATION Goal: RH STG MANAGE BOWEL WITH ASSISTANCE Description: STG Manage Bowel with toileting Assistance. Outcome: Progressing Goal: RH STG MANAGE BOWEL W/MEDICATION W/ASSISTANCE Description: STG Manage Bowel with Medication with mod I  Assistance. Outcome: Progressing   Problem: RH SAFETY Goal: RH STG ADHERE TO SAFETY PRECAUTIONS W/ASSISTANCE/DEVICE Description: STG Adhere to Safety Precautions With  cues Assistance/Device. Outcome: Progressing   Problem: RH KNOWLEDGE DEFICIT Goal: RH STG INCREASE KNOWLEDGE OF HYPERTENSION Description: Patient and spouse will be able to manage HTN using educational resources for medications and dietary modifications independently Outcome: Progressing Goal: RH STG INCREASE KNOWLEGDE OF HYPERLIPIDEMIA Description: Patient and spouse will be able to manage HLD using educational resources for medications and dietary modifications independently Outcome: Progressing Goal: RH STG INCREASE KNOWLEDGE OF STROKE PROPHYLAXIS Description: Patient and spouse will be able to manage secondary risks using educational resources for medications and dietary modifications independently Outcome: Progressing

## 2023-04-06 NOTE — Progress Notes (Signed)
 Occupational Therapy Session Note  Patient Details  Name: Jacqueline Bonilla MRN: [id number] Date of Birth: 03-Aug-1955  Today's Date: 04/06/2023 OT Individual Time: 1115-1200 and 1300-1330 OT Individual Time Calculation (min): 45 min and 30 min   Short Term Goals: Week 1:  OT Short Term Goal 1 (Week 1): STGs = LTGs  Skilled Therapeutic Interventions/Progress Updates:    Visit 1:  Pain: no c/o pain Pt received in recliner leaning to her R with spouse present. He stated that he frequently cues her to sit up straight. Pt has difficulty finding midline. He did report that this has been happening often at home and is more increased when she is feeling fatigue.  He places cues on the mirrors to help her find her center.  Today she was given cue to lean onto her L forearm on recliner arm rest and this put her in neutral. Pt reported that she felt as if she was leaning to the left.  Updated spouse on the level of cuing she has been requiring based on her cognitive / perceptual deficits. He stated that previously there were some days she needed more cuing based on fatigue.   Pt stood up with close S from recliner and with light CGA ambulated to main gym.  Her step pattern was significantly more fluid with improved step length.  In gym pt worked on a variety of GM coordination exercises using a hula hoop to challenge both seated and standing balance and gait.  During gait, had pt hold arms relaxed to keep hula hoop at an even level and used mirror for feedback. In seated asked pt to hold hoop at 3 and 6 oclock.  Pt stated she could not "find" the position as she could not feel where her arms were in space. Placed hands at 3 and 6 for her and pt continued to state that she could not "feel" where her hands were positioned.    After these exercises, pt able to ambulate over 350 feet in a smooth manner with fast gait pattern. Pt stated she is used to walking a lot and walking fast. Used ambulation in OT to work  on developing faster processing and problem solving as pt had to negotiate around obstacles and follow my cues for directions turns.   Pt returned to room.   Pt resting in recliner with all needs met. Alarm set and call light in reach.    Visit 2:  Pain:no c/o pain Pt received in recliner and lunch sitting uneaten. Pt had a fruit cup and a grilled cheese sandwich. Pt stated her spouse was going to bring a healthier option later today.  She stated she had a big fruit shake he made her this am and was not hungry yet.  Pt talked about how much walking she is accustomed to doing at home as she walks a trail with her sister. Pt eager to go outside.  Had pt first walk in depart 350 ft with light CGA to close Supervision. Her gait pattern continued to be fluid as it was this am.  Pt then ambulated to [employer] and walked outside. Sat on a bench outside for 5 minutes and discussed her home activities.  Ambulated back to her room which was over 700 ft. So she walked 1000 ft today with no LOB moving in a very efficient manner.  Returned to room. Pt toileted with close S to light CGA.  Pt resting in recliner with all needs met. Alarm set  and call light in reach.    Therapy Documentation Precautions:  Precautions Precautions: Fall Precaution/Restrictions Comments: R hemi Restrictions Weight Bearing Restrictions Per Provider Order: No    Vital Signs: Therapy Vitals Temp: 97.7 F (36.5 C) Pulse Rate: 82 Resp: 16 BP: 136/87 Patient Position (if appropriate): Lying Oxygen Therapy SpO2: 99 % O2 Device: Room Air ADL: ADL Eating: Set up Grooming: Supervision/safety Upper Body Bathing: Supervision/safety, Moderate cueing Where Assessed-Upper Body Bathing: Shower Lower Body Bathing: Minimal assistance, Moderate cueing Where Assessed-Lower Body Bathing: Shower Upper Body Dressing: Minimal assistance, Moderate cueing Where Assessed-Upper Body Dressing: Edge of bed Lower Body Dressing: Minimal  assistance, Moderate cueing Where Assessed-Lower Body Dressing: Edge of bed Toileting: Minimal assistance Where Assessed-Toileting: Teacher, adult education: Curator Method: Proofreader: Acupuncturist: Insurance underwriter Method: Designer, industrial/product: Grab bars, Shower seat with back   Therapy/Group: Individual Therapy  Kayleb Warshaw 04/06/2023, 8:01 AM

## 2023-04-06 NOTE — Progress Notes (Signed)
 PROGRESS NOTE   Subjective/Complaints:  Sitting in WC. Denies pain this AM. No new concerns.   ROS- no Fever, abdominal pain,  CP, SOB, N/V/D Denies constipation Objective:   No results found. No results for input(s): "WBC", "HGB", "HCT", "PLT" in the last 72 hours.  No results for input(s): "NA", "K", "CL", "CO2", "GLUCOSE", "BUN", "CREATININE", "CALCIUM" in the last 72 hours.   Intake/Output Summary (Last 24 hours) at 04/06/2023 1045 Last data filed at 04/06/2023 0751 Gross per 24 hour  Intake 716 ml  Output --  Net 716 ml        Physical Exam: Vital Signs Blood pressure 136/87, pulse 82, temperature 97.7 F (36.5 C), resp. rate 16, height 5\' 4"  (1.626 m), weight 51.7 kg, last menstrual period 01/02/2003, SpO2 99%.  General: No acute distress, sitting in WC Mood and affect are appropriate Heart: Regular rate and rhythm no rubs murmurs or extra sounds Lungs: Clear to auscultation, breathing unlabored, no rales or wheezes, on RA Abdomen: Positive bowel sounds, soft nontender to palpation, nondistended Extremities: No clubbing, cyanosis, or edema Skin: No evidence of breakdown, no evidence of rash Neurologic: Cranial nerves II through XII intact, motor strength is 5/5 inLeft 4/5 RIght deltoid, bicep, tricep, grip, hip flexor, knee extensors, ankle dorsiflexor and plantar flexor Sensory exam normal sensation to light touch  Cerebellar exam mild dysmetria R FNF Musculoskeletal: Full range of motion in all 4 extremities. No joint swelling Exam stable 4/5    Assessment/Plan: 1. Functional deficits which require 3+ hours per day of interdisciplinary therapy in a comprehensive inpatient rehab setting. Physiatrist is providing close team supervision and 24 hour management of active medical problems listed below. Physiatrist and rehab team continue to assess barriers to discharge/monitor patient progress toward functional  and medical goals  Care Tool:  Bathing    Body parts bathed by patient: Right arm, Left arm, Chest, Abdomen, Front perineal area, Buttocks, Right upper leg, Face, Left lower leg, Right lower leg, Left upper leg         Bathing assist Assist Level: Contact Guard/Touching assist     Upper Body Dressing/Undressing Upper body dressing   What is the patient wearing?: Pull over shirt    Upper body assist Assist Level: Set up assist    Lower Body Dressing/Undressing Lower body dressing      What is the patient wearing?: Underwear/pull up, Pants     Lower body assist Assist for lower body dressing: Maximal Assistance - Patient 25 - 49%     Toileting Toileting    Toileting assist Assist for toileting: Supervision/Verbal cueing     Transfers Chair/bed transfer  Transfers assist     Chair/bed transfer assist level: Minimal Assistance - Patient > 75%     Locomotion Ambulation   Ambulation assist      Assist level: Minimal Assistance - Patient > 75% Assistive device: Hand held assist Max distance: 110 ft   Walk 10 feet activity   Assist     Assist level: Minimal Assistance - Patient > 75% Assistive device: Hand held assist   Walk 50 feet activity   Assist    Assist level: Minimal Assistance -  Patient > 75% Assistive device: Hand held assist    Walk 150 feet activity   Assist Walk 150 feet activity did not occur: Safety/medical concerns         Walk 10 feet on uneven surface  activity   Assist Walk 10 feet on uneven surfaces activity did not occur: Safety/medical concerns         Wheelchair     Assist Is the patient using a wheelchair?: Yes Type of Wheelchair: Manual    Wheelchair assist level: Total Assistance - Patient < 25% Max wheelchair distance: 115 ft    Wheelchair 50 feet with 2 turns activity    Assist        Assist Level: Total Assistance - Patient < 25%   Wheelchair 150 feet activity     Assist       Assist Level: Total Assistance - Patient < 25%   Blood pressure 136/87, pulse 82, temperature 97.7 F (36.5 C), resp. rate 16, height 5\' 4"  (1.626 m), weight 51.7 kg, last menstrual period 01/02/2003, SpO2 99%.  Medical Problem List and Plan: 1. Functional deficits secondary to left MCA infarction status post TNK as well as history of left MCA infarction 2022 and received CIR             -patient may shower             -ELOS/Goals: 4/11 supervision goals  - Continue CIR 2.  Antithrombotics: -DVT/anticoagulation:  Mechanical: Antiembolism stockings, thigh (TED hose) Bilateral lower extremities             -antiplatelet therapy: Aspirin 81 mg daily and Brilinta 90 mg twice daily x 4 weeks then aspirin alone.  Patient enrolled in [place] stroke research study 3. Pain Management: Tylenol as needed 4. Mood/Behavior/Sleep: Provide emotional support             -antipsychotic agents: N/A 5. Neuropsych/cognition: This patient is not capable of making decisions on her own behalf. 6. Skin/Wound Care: Routine skin checks 7. Fluids/Electrolytes/Nutrition: Routine in and outs with follow-up chemistries    Latest Ref Rng & Units 04/02/2023    5:25 AM 03/27/2023    9:53 AM 03/27/2023    9:47 AM  BMP  Glucose 70 - 99 mg/dL 95  161  096   BUN 8 - 23 mg/dL 17  21  20    Creatinine 0.44 - 1.00 mg/dL 0.45  4.09  8.11   Sodium 135 - 145 mmol/L 138  140  138   Potassium 3.5 - 5.1 mmol/L 4.0  4.2  4.2   Chloride 98 - 111 mmol/L 104  103  103   CO2 22 - 32 mmol/L 24   27   Calcium 8.9 - 10.3 mg/dL 9.4   9.6     8.  Hyperlipidemia.  Lipitor .9.  History of thrombocytopenia.  Latest platelet count 226,000    Latest Ref Rng & Units 04/02/2023    5:25 AM 03/27/2023    9:53 AM 03/27/2023    9:47 AM  CBC  WBC 4.0 - 10.5 K/uL 4.0   5.0   Hemoglobin 12.0 - 15.0 g/dL 91.4  78.2  95.6   Hematocrit 36.0 - 46.0 % 36.3  40.0  39.9   Platelets 150 - 400 K/uL 226   262     10.  Urine drug screen positive  marijuana.  Counseling  11.  Bowel function.  LBM 4/3.  Consider laxative if no BM by tomorrow to prevent constipation  LOS: 5 days A FACE TO FACE EVALUATION WAS PERFORMED  Fanny Dance 04/06/2023, 10:45 AM

## 2023-04-07 DIAGNOSIS — K5900 Constipation, unspecified: Secondary | ICD-10-CM | POA: Diagnosis not present

## 2023-04-07 DIAGNOSIS — I63512 Cerebral infarction due to unspecified occlusion or stenosis of left middle cerebral artery: Secondary | ICD-10-CM | POA: Diagnosis not present

## 2023-04-07 NOTE — Plan of Care (Signed)
  Problem: RH BOWEL ELIMINATION Goal: RH STG MANAGE BOWEL WITH ASSISTANCE Description: STG Manage Bowel with toileting Assistance. Outcome: Progressing Goal: RH STG MANAGE BOWEL W/MEDICATION W/ASSISTANCE Description: STG Manage Bowel with Medication with mod I  Assistance. Outcome: Progressing   Problem: RH SAFETY Goal: RH STG ADHERE TO SAFETY PRECAUTIONS W/ASSISTANCE/DEVICE Description: STG Adhere to Safety Precautions With  cues Assistance/Device. Outcome: Progressing   Problem: RH KNOWLEDGE DEFICIT Goal: RH STG INCREASE KNOWLEDGE OF STROKE PROPHYLAXIS Description: Patient and spouse will be able to manage secondary risks using educational resources for medications and dietary modifications independently Outcome: Progressing

## 2023-04-07 NOTE — Progress Notes (Signed)
 PROGRESS NOTE   Subjective/Complaints:  NO new concerns this AM. She says she feels OK today.  LBM 4/5   ROS- no Fever, abdominal pain,  CP, SOB, N/V/D, new motor or sensory changes  Denies constipation Headache- mostly resolved  Objective:   No results found. No results for input(s): "WBC", "HGB", "HCT", "PLT" in the last 72 hours.  No results for input(s): "NA", "K", "CL", "CO2", "GLUCOSE", "BUN", "CREATININE", "CALCIUM" in the last 72 hours.   Intake/Output Summary (Last 24 hours) at 04/07/2023 1127 Last data filed at 04/06/2023 1429 Gross per 24 hour  Intake 0 ml  Output --  Net 0 ml        Physical Exam: Vital Signs Blood pressure 136/87, pulse 67, temperature 98.2 F (36.8 C), temperature source Oral, resp. rate 16, height 5\' 4"  (1.626 m), weight 51.7 kg, last menstrual period 01/02/2003, SpO2 99%.  General: No acute distress, sitting in WC Mood and affect are appropriate, pleasant  Heart: Regular rate and rhythm no rubs murmurs or extra sounds Lungs: Clear to auscultation, breathing unlabored, no rales or wheezes, on RA Abdomen: Positive bowel sounds, soft nontender to palpation, nondistended Extremities: No clubbing, cyanosis, or edema Skin: warm and dry  Neurologic: Cranial nerves II through XII intact, motor strength is 5/5 inLeft 4/5 RIght deltoid, bicep, tricep, grip, hip flexor, knee extensors, ankle dorsiflexor and plantar flexor Sensory exam normal sensation to light touch  Cerebellar exam mild dysmetria R FNF Musculoskeletal: Full range of motion in all 4 extremities. No joint swelling Exam stable 4/6    Assessment/Plan: 1. Functional deficits which require 3+ hours per day of interdisciplinary therapy in a comprehensive inpatient rehab setting. Physiatrist is providing close team supervision and 24 hour management of active medical problems listed below. Physiatrist and rehab team continue to  assess barriers to discharge/monitor patient progress toward functional and medical goals  Care Tool:  Bathing    Body parts bathed by patient: Right arm, Left arm, Chest, Abdomen, Front perineal area, Buttocks, Right upper leg, Face, Left lower leg, Right lower leg, Left upper leg         Bathing assist Assist Level: Contact Guard/Touching assist     Upper Body Dressing/Undressing Upper body dressing   What is the patient wearing?: Pull over shirt    Upper body assist Assist Level: Set up assist    Lower Body Dressing/Undressing Lower body dressing      What is the patient wearing?: Underwear/pull up, Pants     Lower body assist Assist for lower body dressing: Maximal Assistance - Patient 25 - 49%     Toileting Toileting    Toileting assist Assist for toileting: Supervision/Verbal cueing     Transfers Chair/bed transfer  Transfers assist     Chair/bed transfer assist level: Minimal Assistance - Patient > 75%     Locomotion Ambulation   Ambulation assist      Assist level: Minimal Assistance - Patient > 75% Assistive device: Hand held assist Max distance: 110 ft   Walk 10 feet activity   Assist     Assist level: Minimal Assistance - Patient > 75% Assistive device: Hand held assist  Walk 50 feet activity   Assist    Assist level: Minimal Assistance - Patient > 75% Assistive device: Hand held assist    Walk 150 feet activity   Assist Walk 150 feet activity did not occur: Safety/medical concerns         Walk 10 feet on uneven surface  activity   Assist Walk 10 feet on uneven surfaces activity did not occur: Safety/medical concerns         Wheelchair     Assist Is the patient using a wheelchair?: Yes Type of Wheelchair: Manual    Wheelchair assist level: Total Assistance - Patient < 25% Max wheelchair distance: 115 ft    Wheelchair 50 feet with 2 turns activity    Assist        Assist Level: Total  Assistance - Patient < 25%   Wheelchair 150 feet activity     Assist      Assist Level: Total Assistance - Patient < 25%   Blood pressure 136/87, pulse 67, temperature 98.2 F (36.8 C), temperature source Oral, resp. rate 16, height 5\' 4"  (1.626 m), weight 51.7 kg, last menstrual period 01/02/2003, SpO2 99%.  Medical Problem List and Plan: 1. Functional deficits secondary to left MCA infarction status post TNK as well as history of left MCA infarction 2022 and received CIR             -patient may shower             -ELOS/Goals: 4/11 supervision goals  -Continue CIR 2.  Antithrombotics: -DVT/anticoagulation:  Mechanical: Antiembolism stockings, thigh (TED hose) Bilateral lower extremities             -antiplatelet therapy: Aspirin 81 mg daily and Brilinta 90 mg twice daily x 4 weeks then aspirin alone.  Patient enrolled in [place] stroke research study 3. Pain Management: Tylenol as needed 4. Mood/Behavior/Sleep: Provide emotional support             -antipsychotic agents: N/A 5. Neuropsych/cognition: This patient is not capable of making decisions on her own behalf. 6. Skin/Wound Care: Routine skin checks 7. Fluids/Electrolytes/Nutrition: Routine in and outs with follow-up chemistries    Latest Ref Rng & Units 04/02/2023    5:25 AM 03/27/2023    9:53 AM 03/27/2023    9:47 AM  BMP  Glucose 70 - 99 mg/dL 95  161  096   BUN 8 - 23 mg/dL 17  21  20    Creatinine 0.44 - 1.00 mg/dL 0.45  4.09  8.11   Sodium 135 - 145 mmol/L 138  140  138   Potassium 3.5 - 5.1 mmol/L 4.0  4.2  4.2   Chloride 98 - 111 mmol/L 104  103  103   CO2 22 - 32 mmol/L 24   27   Calcium 8.9 - 10.3 mg/dL 9.4   9.6     8.  Hyperlipidemia.  Lipitor .9.  History of thrombocytopenia.  Latest platelet count 226,000    Latest Ref Rng & Units 04/02/2023    5:25 AM 03/27/2023    9:53 AM 03/27/2023    9:47 AM  CBC  WBC 4.0 - 10.5 K/uL 4.0   5.0   Hemoglobin 12.0 - 15.0 g/dL 91.4  78.2  95.6   Hematocrit 36.0 -  46.0 % 36.3  40.0  39.9   Platelets 150 - 400 K/uL 226   262     10.  Urine drug screen positive marijuana.  Counseling  11.  Bowel function.   -LBM 4/5  LOS: 6 days A FACE TO FACE EVALUATION WAS PERFORMED  Fanny Dance 04/07/2023, 11:27 AM

## 2023-04-07 NOTE — Plan of Care (Signed)
  Problem: Consults Goal: RH STROKE PATIENT EDUCATION Description: See Patient Education module for education specifics  Outcome: Progressing   Problem: RH SAFETY Goal: RH STG ADHERE TO SAFETY PRECAUTIONS W/ASSISTANCE/DEVICE Description: STG Adhere to Safety Precautions With  cues Assistance/Device. Outcome: Progressing   Problem: RH KNOWLEDGE DEFICIT Goal: RH STG INCREASE KNOWLEDGE OF HYPERTENSION Description: Patient and spouse will be able to manage HTN using educational resources for medications and dietary modifications independently Outcome: Progressing Goal: RH STG INCREASE KNOWLEGDE OF HYPERLIPIDEMIA Description: Patient and spouse will be able to manage HLD using educational resources for medications and dietary modifications independently Outcome: Progressing   Problem: RH KNOWLEDGE DEFICIT Goal: RH STG INCREASE KNOWLEGDE OF HYPERLIPIDEMIA Description: Patient and spouse will be able to manage HLD using educational resources for medications and dietary modifications independently Outcome: Progressing

## 2023-04-08 ENCOUNTER — Other Ambulatory Visit (HOSPITAL_COMMUNITY): Payer: Self-pay

## 2023-04-08 DIAGNOSIS — I63512 Cerebral infarction due to unspecified occlusion or stenosis of left middle cerebral artery: Secondary | ICD-10-CM | POA: Diagnosis not present

## 2023-04-08 DIAGNOSIS — I69993 Ataxia following unspecified cerebrovascular disease: Secondary | ICD-10-CM | POA: Diagnosis not present

## 2023-04-08 MED ORDER — STUDY - LIBREXIA-STROKE - MILVEXIAN 25 MG OR PLACEBO TABLET (PI-SETHI): 1.0000 | ORAL_TABLET | Freq: Two times a day (BID) | ORAL | 0 refills | Status: DC

## 2023-04-08 MED ORDER — ATORVASTATIN CALCIUM 40 MG PO TABS
40.0000 mg | ORAL_TABLET | Freq: Every day | ORAL | 0 refills | Status: DC
  Filled 2023-04-08: qty 30, 30d supply, fill #0

## 2023-04-08 MED ORDER — ACETAMINOPHEN 325 MG PO TABS: 650.0000 mg | ORAL_TABLET | ORAL | Status: DC | PRN

## 2023-04-08 MED ORDER — TICAGRELOR 90 MG PO TABS
90.0000 mg | ORAL_TABLET | Freq: Two times a day (BID) | ORAL | 0 refills | Status: DC
  Filled 2023-04-08: qty 60, 30d supply, fill #0

## 2023-04-08 MED ORDER — LOSARTAN POTASSIUM 25 MG PO TABS
12.5000 mg | ORAL_TABLET | Freq: Every day | ORAL | 0 refills | Status: DC
Start: 2023-04-08 — End: 2023-08-23
  Filled 2023-04-08: qty 30, 60d supply, fill #0

## 2023-04-08 NOTE — Progress Notes (Signed)
 Physical Therapy Session Note  Patient Details  Name: Jacqueline Bonilla MRN: [id number] Date of Birth: 1955/07/30  Today's Date: 04/08/2023 PT Individual Time: 1032-1115 PT Individual Time Calculation (min): 43 min   Short Term Goals: Week 1:  PT Short Term Goal 1 (Week 1): STG = LTG d/t ELOS  Skilled Therapeutic Interventions/Progress Updates:  Patient seated upright in recliner on entrance to room. Husband present. Patient alert and agreeable to PT session.   Patient with no pain complaint at start of session.  Husband with questions re: pt's CLOF but related that this therapist has not worked with pt since evaluation. Will assess during session and relate to husband. Husband relates difficulty with pt performing stutter-stepping in pivot turns. Noted on eval and primary therapist continuing to address. Husband also relates pt's progress in aquatic therapy with previous OPPT. Pt states that getting prepped, dressed and then into/ out of pool makes sessions difficult. Okay with actual session.   Therapeutic Activity/ NMR: Transfers: Pt performed sit<>stand and stand pivot transfers throughout session with slight impulsivity, intermittent need for LE extension thrust into seated surface vs use of hands to assist with power up and controlling descent. Pt noted to use Parkinsonian-type stepping pattern during all stand pivot transfers. Blocked practice in single step turn of LE 90* to promote larger step. Pt with intermittent ability to follow multimodal cues and poor carryover into functional transfers.   Gait Training:  Pt ambulated 110' x2 ft using no AD with close supervision. Demonstrated good step lengths/ heights throughout with good balance. Provided vc for attn to step lengths to improve proprioception.   Neuromuscular Re-ed: NMR facilitated during session with focus on standing balance. Pt guided in Berg Balance test and TUG reassessment.   Patient demonstrates increased fall risk  as noted by score of   41/ 56 on Berg Balance Scale.  (<36= high risk for falls, close to 100%; 37-45 significant >80%; 46-51 moderate >50%; 52-55 lower >25%).  TUG completed 13.08 seconds (A score of >13.5 seconds indicates patient is at a high fall risk.) Pt educated on interpretation of their score. Pt demos an improvement in test by 6 seconds demonstrating improved fall risk.   NMR performed for improvements in motor control and coordination, balance, sequencing, judgement, and self confidence/ efficacy in performing all aspects of mobility at highest level of independence.   Patient seated upright in recliner at end of session with brakes locked, seat pad alarm set, and all needs within reach.   Therapy Documentation Precautions:  Precautions Precautions: Fall Precaution/Restrictions Comments: R hemi Restrictions Weight Bearing Restrictions Per Provider Order: No  Pain: Pain Assessment Pain Scale: 0-10 Pain Score: 0-No pain  Balance: Berg Balance Test Sit to Stand: Able to stand  independently using hands Standing Unsupported: Able to stand safely 2 minutes Sitting with Back Unsupported but Feet Supported on Floor or Stool: Able to sit safely and securely 2 minutes Stand to Sit: Controls descent by using hands Transfers: Able to transfer safely, minor use of hands Standing Unsupported with Eyes Closed: Able to stand 10 seconds safely Standing Ubsupported with Feet Together: Able to place feet together independently and stand for 1 minute with supervision From Standing, Reach Forward with Outstretched Arm: Can reach forward >12 cm safely (5") From Standing Position, Pick up Object from Floor: Able to pick up shoe, needs supervision From Standing Position, Turn to Look Behind Over each Shoulder: Turn sideways only but maintains balance Turn 360 Degrees: Able to turn 360  degrees safely but slowly Standing Unsupported, Alternately Place Feet on Step/Stool: Able to complete >2  steps/needs minimal assist Standing Unsupported, One Foot in Front: Able to take small step independently and hold 30 seconds Standing on One Leg: Able to lift leg independently and hold 5-10 seconds Total Score: 41 Timed Up and Go Test TUG: Normal TUG Normal TUG (seconds): 13.08 Static Sitting Balance Static Sitting - Balance Support: Feet unsupported;Feet supported Static Sitting - Level of Assistance: 7: Independent Dynamic Sitting Balance Dynamic Sitting - Balance Support: Feet supported Dynamic Sitting - Level of Assistance: 5: Stand by assistance Static Standing Balance Static Standing - Balance Support: During functional activity Static Standing - Level of Assistance: 6: Modified independent (Device/Increase time) Dynamic Standing Balance Dynamic Standing - Balance Support: During functional activity Dynamic Standing - Level of Assistance: 5: Stand by assistance   Therapy/Group: Individual Therapy  Loel Dubonnet PT, DPT, CSRS 04/08/2023, 9:11 AM

## 2023-04-08 NOTE — Progress Notes (Signed)
 Speech Language Pathology Daily Session Note  Patient Details  Name: SAMYAH BILBO MRN: [id number] Date of Birth: Sep 09, 1955  Today's Date: 04/08/2023 SLP Individual Time: 1345-1445 SLP Individual Time Calculation (min): 60 min  Short Term Goals: Week 1: SLP Short Term Goal 1 (Week 1): STG = LTG due to ELOS  Skilled Therapeutic Interventions: Skilled therapy session focused on cognitive and dysphagia goals. SLP facilitated session by providing minA during calendar creation task. Given verbalized instructions, patient was prompted to instruct SLP on where and what to place on the calendar. Patient then was prompted to answer comprehension questions about her avaliability throughout the month. Patient required modA to complete this activity. Patient with seemingly improved visual perceptual skills compared to prior sessions, though she still required additional time to locate day/dates on the calendar. SLP continued to challenge patient through abstract reasoning task. Patient required minA to identify the "odd one out" of a set of 4 objects, however occasionally required min-modA to add an additional object to the category.  At the end of the session, SLP observed patient during consumption of thin liquids via straw. Patient with x1 delayed throat clear, though patient with throat clears before any consumption of PO. Unsure if throat clear is due to bolus misdirection vs baseline. Recommend continuation of current diet with aspiration precautions. Patient transferred to bathroom and continent of bladder. Of note, patients urine bright green and RN notified. Patient left in chair with alarm set and call bell in reach. Continue POC.  Pain None reported   Therapy/Group: Individual Therapy  Kallista Pae M.A., CCC-SLP 04/08/2023, 7:46 AM

## 2023-04-08 NOTE — Progress Notes (Signed)
 Physical Therapy Session Note  Patient Details  Name: Jacqueline Bonilla MRN: [id number] Date of Birth: 07-Apr-1955  Today's Date: 04/08/2023 PT Individual Time: 8295-6213 PT Individual Time Calculation (min): 42 min   Short Term Goals: Week 1:  PT Short Term Goal 1 (Week 1): STG = LTG d/t ELOS  Skilled Therapeutic Interventions/Progress Updates:  Patient seated upright in recliner on entrance to room. Patient alert and agreeable to PT session.   Patient with no pain complaint at start of session.  Therapeutic Activity: Bed Mobility: Pt performed supine <> sit to bed in ADL apartment and is able to perform with supervision/ Mod I. No vc required for technique. Transfers: Transfers: Pt performed sit<>stand and stand pivot transfers throughout session with continued slight impulsivity, intermittent need for LE extension thrust into seated surface vs use of hands to assist with power up and controlling descent. Noted in uncontrolled descent to sit with pt partially aware of poor technique.   Continued Parkinsonian-type stepping pattern during all stand pivot transfers. Blocked practice again with focus in performance with larger step to turn foot positioning by 90* to promote larger step. Pt steps in wrong direction without tc. Pt with intermittent ability to follow multimodal cues and poor carryover into functional transfers.   NMR performed for improvements in motor control and coordination, balance, sequencing, judgement, and self confidence/ efficacy in performing all aspects of mobility at highest level of independence.   Gait Training:  Pt ambulated to/from therapy gym with no AD and supervision. Guided in .   6 Min Walk Test:  Instructed patient to ambulate as quickly and as safely as possible for 6 minutes using LRAD. Patient was allowed to take standing rest breaks without stopping the test, but if the patient required a sitting rest break the clock would be stopped and the test  would be over.  Results: 969 feet (295.35 meters, Avg speed 0.82 m/s) using no AD with supervision. Results indicate that the patient has reduced endurance with ambulation compared to age matched norms.  Age Matched Norms: 32-69 yo M: 46 F: 80, 40-79 yo M: 68 F: 471, 63-89 yo M: 417 F: 392 MDC: 58.21 meters (190.98 feet) or 50 meters (ANPTA Core Set of Outcome Measures for Adults with Neurologic Conditions, 2018) .  Patient seated in recliner at end of session with brakes locked, seat pad alarm set, and all needs within reach.   Therapy Documentation Precautions:  Precautions Precautions: Fall Precaution/Restrictions Comments: R hemi, L field cut Restrictions Weight Bearing Restrictions Per Provider Order: No General:   Vital Signs: Therapy Vitals Temp: 98 F (36.7 C) Temp Source: Oral Pulse Rate: 70 Resp: 16 BP: 130/65 Patient Position (if appropriate): Lying Oxygen Therapy SpO2: 98 % Pain:   Mobility: Bed Mobility Bed Mobility: Supine to Sit;Sit to Supine;Sitting - Scoot to Edge of Bed Supine to Sit: Independent Sitting - Scoot to [place] of Bed: Independent Sit to Supine: Independent Transfers Transfers: Sit to Stand;Stand to [employer] Transfers Sit to Stand: Independent Stand to Sit: Independent Stand Pivot Transfers: Supervision/Verbal cueing Stand Pivot Transfer Details: Verbal cues for precautions/safety Transfer (Assistive device): None Locomotion : Gait Gait Assistance: Supervision/Verbal cueing Assistive device: None Gait Assistance Details: Verbal cues for precautions/safety Gait Gait: Yes Gait Pattern: Impaired Gait Pattern: Shuffle (stutter steps with pivot stepping, holds L knee in mild flexion) Gait velocity: 0.82 m/s = community ambulator High Level Ambulation High Level Ambulation: Backwards walking;Side stepping Side Stepping: wants to perform with turn into direction  of travel and requires block to pelvis to maintain  sidestep. Backwards Walking: initiates with R. then step-to with L until consistent vc/ tc to correct Stairs / Additional Locomotion Stairs: Yes Stairs Assistance: Contact Guard/Touching assist Stair Management Technique: Two rails Height of Stairs: 6 Wheelchair Mobility Wheelchair Mobility: No  Trunk/Postural Assessment : Cervical Assessment Cervical Assessment: Within Functional Limits Thoracic Assessment Thoracic Assessment: Within Functional Limits Lumbar Assessment Lumbar Assessment: Exceptions to WFL (slight posterior pelvic tilt in sitting) Postural Control Postural Control: Deficits on evaluation Trunk Control: HHA required to support balance with higher level and more dynamic balance challenges  Balance: Balance Balance Assessed: Yes Standardized Balance Assessment Standardized Balance Assessment: Timed Up and Go Test;Berg Balance Test ( = 969' (gait speed = 0.82 m/s))  Therapy/Group: Individual Therapy  Loel Dubonnet PT, DPT, CSRS 04/08/2023, 6:50 PM

## 2023-04-08 NOTE — Progress Notes (Signed)
 PROGRESS NOTE   Subjective/Complaints:  C/o few therapies over the weekend, pt without new issues this am , would like to go home sooner than Friday if possible  LBM 4/6 ROS-  CP, SOB, N/V/D,   Objective:   No results found. No results for input(s): "WBC", "HGB", "HCT", "PLT" in the last 72 hours.  No results for input(s): "NA", "K", "CL", "CO2", "GLUCOSE", "BUN", "CREATININE", "CALCIUM" in the last 72 hours.   Intake/Output Summary (Last 24 hours) at 04/08/2023 0757 Last data filed at 04/07/2023 1811 Gross per 24 hour  Intake 237 ml  Output --  Net 237 ml        Physical Exam: Vital Signs Blood pressure 130/77, pulse 66, temperature 98 F (36.7 C), resp. rate 16, height 5\' 4"  (1.626 m), weight 51.7 kg, last menstrual period 01/02/2003, SpO2 98%.  General: No acute distress, sitting in WC Mood and affect are appropriate, pleasant  Heart: Regular rate and rhythm no rubs murmurs or extra sounds Lungs: Clear to auscultation, breathing unlabored, no rales or wheezes, on RA Abdomen: Positive bowel sounds, soft nontender to palpation, nondistended Extremities: No clubbing, cyanosis, or edema Skin: warm and dry  Neurologic: Cranial nerves II through XII intact, motor strength is 5/5 inLeft 4/5 RIght deltoid, bicep, tricep, grip, hip flexor, knee extensors, ankle dorsiflexor and plantar flexor Sensory exam normal sensation to light touch  Cerebellar exam mild dysmetria R FNF Musculoskeletal: Full range of motion in all 4 extremities. No joint swelling Exam stable 4/6    Assessment/Plan: 1. Functional deficits which require 3+ hours per day of interdisciplinary therapy in a comprehensive inpatient rehab setting. Physiatrist is providing close team supervision and 24 hour management of active medical problems listed below. Physiatrist and rehab team continue to assess barriers to discharge/monitor patient progress toward  functional and medical goals  Care Tool:  Bathing    Body parts bathed by patient: Right arm, Left arm, Chest, Abdomen, Front perineal area, Buttocks, Right upper leg, Face, Left lower leg, Right lower leg, Left upper leg         Bathing assist Assist Level: Contact Guard/Touching assist     Upper Body Dressing/Undressing Upper body dressing   What is the patient wearing?: Pull over shirt    Upper body assist Assist Level: Set up assist    Lower Body Dressing/Undressing Lower body dressing      What is the patient wearing?: Underwear/pull up, Pants     Lower body assist Assist for lower body dressing: Maximal Assistance - Patient 25 - 49%     Toileting Toileting    Toileting assist Assist for toileting: Supervision/Verbal cueing     Transfers Chair/bed transfer  Transfers assist     Chair/bed transfer assist level: Minimal Assistance - Patient > 75%     Locomotion Ambulation   Ambulation assist      Assist level: Minimal Assistance - Patient > 75% Assistive device: Hand held assist Max distance: 110 ft   Walk 10 feet activity   Assist     Assist level: Minimal Assistance - Patient > 75% Assistive device: Hand held assist   Walk 50 feet activity   Assist  Assist level: Minimal Assistance - Patient > 75% Assistive device: Hand held assist    Walk 150 feet activity   Assist Walk 150 feet activity did not occur: Safety/medical concerns         Walk 10 feet on uneven surface  activity   Assist Walk 10 feet on uneven surfaces activity did not occur: Safety/medical concerns         Wheelchair     Assist Is the patient using a wheelchair?: Yes Type of Wheelchair: Manual    Wheelchair assist level: Total Assistance - Patient < 25% Max wheelchair distance: 115 ft    Wheelchair 50 feet with 2 turns activity    Assist        Assist Level: Total Assistance - Patient < 25%   Wheelchair 150 feet activity      Assist      Assist Level: Total Assistance - Patient < 25%   Blood pressure 130/77, pulse 66, temperature 98 F (36.7 C), resp. rate 16, height 5\' 4"  (1.626 m), weight 51.7 kg, last menstrual period 01/02/2003, SpO2 98%.  Medical Problem List and Plan: 1. Functional deficits secondary to left MCA infarction status post TNK as well as history of left MCA infarction 2022 and received CIR             -patient may shower             -ELOS/Goals: 4/11 supervision goals  -Continue CIR 2.  Antithrombotics: -DVT/anticoagulation:  Mechanical: Antiembolism stockings, thigh (TED hose) Bilateral lower extremities             -antiplatelet therapy: Aspirin 81 mg daily and Brilinta 90 mg twice daily x 4 weeks then aspirin alone.  Patient enrolled in [place] stroke research study 3. Pain Management: Tylenol as needed 4. Mood/Behavior/Sleep: Provide emotional support             -antipsychotic agents: N/A 5. Neuropsych/cognition: This patient is not capable of making decisions on her own behalf. 6. Skin/Wound Care: Routine skin checks 7. Fluids/Electrolytes/Nutrition: Routine in and outs with follow-up chemistries    Latest Ref Rng & Units 04/02/2023    5:25 AM 03/27/2023    9:53 AM 03/27/2023    9:47 AM  BMP  Glucose 70 - 99 mg/dL 95  811  914   BUN 8 - 23 mg/dL 17  21  20    Creatinine 0.44 - 1.00 mg/dL 7.82  9.56  2.13   Sodium 135 - 145 mmol/L 138  140  138   Potassium 3.5 - 5.1 mmol/L 4.0  4.2  4.2   Chloride 98 - 111 mmol/L 104  103  103   CO2 22 - 32 mmol/L 24   27   Calcium 8.9 - 10.3 mg/dL 9.4   9.6     8.  Hyperlipidemia.  Lipitor .9.  History of thrombocytopenia.  Latest platelet count 226,000    Latest Ref Rng & Units 04/02/2023    5:25 AM 03/27/2023    9:53 AM 03/27/2023    9:47 AM  CBC  WBC 4.0 - 10.5 K/uL 4.0   5.0   Hemoglobin 12.0 - 15.0 g/dL 08.6  57.8  46.9   Hematocrit 36.0 - 46.0 % 36.3  40.0  39.9   Platelets 150 - 400 K/uL 226   262     10.  Urine drug  screen positive marijuana.  Counseling  11.  Bowel function.   -LBM 4/5  LOS: 7 days A FACE  TO FACE EVALUATION WAS PERFORMED  [name] 04/08/2023, 7:57 AM

## 2023-04-08 NOTE — Plan of Care (Signed)
  Problem: RH SKIN INTEGRITY Goal: RH STG SKIN FREE OF INFECTION/BREAKDOWN Outcome: Progressing   

## 2023-04-08 NOTE — Progress Notes (Signed)
 Occupational Therapy Session Note  Patient Details  Name: Jacqueline Bonilla MRN: [id number] Date of Birth: [birth year]  Today's Date: 04/08/2023 OT Individual Time: 0830-0930 OT Individual Time Calculation (min): 60 min    Short Term Goals: Week 1:  OT Short Term Goal 1 (Week 1): STGs = LTGs  Skilled Therapeutic Interventions/Progress Updates:    Pt received in recliner and agreeable to a shower.  Pt handed her clothing bag to find clothing to wear today. It took her some extra time to make decisions.  Pt ambulated to toilet with supervision. Post toileting pt stood up with pants around ankles and tried to walk out of bathroom. Cued her to sit to doff pants fully.  She then stepped into shower.  Unable to coordinate hands to squeeze soap from bottle onto washcloth. Pt reports she only uses bar soap and no cloth at home. She is not able to recall if she has a shower seat. Recommended she get one and will speak with spouse.   Pt bathed self needing cues to ensure she washed all body parts. Continues to have challenges with sequencing.  Pt stated that she has a caregiver that comes to her house and can assist her with showering if needed.    Dried off and ambulated to recliner to dress.  Needed A with very tight overhead sports bra and cues with donning shirt and pants. It took her 3 trials with shirt with mod cues to put R hand in first. 2 trials with pants. Pt did start R leg but got it into the wrong leg hole.  Some sessions she needs minimal cues to no cues. To facilitate problem solving and awareness, using gentle questioning cues vs directive cues.   Pt ambulated to sink and stood at sink to brush teeth. It took her extra time but was able to problem solve which water faucet handle to use.  Like the shower soap bottle, had great difficulty with squeezing paste and placing on toothbrush. Tried activity with brush in L hand and then in R hand.  Assist to place paste and pt attempted brushing with R  hand, but had to use L.  She had difficulty manipulating brush with either hand.  Recommend pt continue to work on B integration activities.  She would also benefit from an electric toothbrush.    Pt reported she is VERY tired as she has not been sleeping well and requesting to D/c earlier than Friday. Will talk with team this afternoon.    Pt resting in recliner with all needs met. Alarm set and call light in reach.    Therapy Documentation Precautions:  Precautions Precautions: Fall Precaution/Restrictions Comments: R hemi Restrictions Weight Bearing Restrictions Per Provider Order: No   Pain: Pain Assessment Pain Scale: 0-10 Pain Score: 0-No pain ADL: ADL Eating: Set up Grooming: Supervision/safety Where Assessed-Grooming: Standing at sink Upper Body Bathing: Supervision/safety, Moderate cueing Where Assessed-Upper Body Bathing: Shower Lower Body Bathing:  (supervision shirt, min A with tight sports bra) Where Assessed-Lower Body Bathing: Shower Upper Body Dressing: Supervision/safety, Moderate assistance Where Assessed-Upper Body Dressing: Edge of bed Lower Body Dressing: Minimal assistance, Moderate cueing Where Assessed-Lower Body Dressing: Edge of bed Toileting: Supervision/safety, Moderate cueing Where Assessed-Toileting: Teacher, adult education: Close supervision Statistician Method: Proofreader: Acupuncturist: Close supervision Film/video editor Method: Designer, industrial/product: Grab bars, Shower seat with back   Therapy/Group: Individual Therapy  Kosta Schnitzler 04/08/2023, 12:12 PM

## 2023-04-08 NOTE — Progress Notes (Signed)
 Physical Therapy Discharge Summary  Patient Details  Name: JAYDEEN DARLEY MRN: [id number] Date of Birth: December 26, 1955  Date of Discharge from PT service:April 09, 2023  Today's Date: 04/08/2023   Patient has met {NUMBERS [id number]} of 10 long term goals due to improved balance, improved postural control, functional use of  right upper extremity and right lower extremity, improved attention, improved awareness, and improved coordination.  Patient to discharge at an ambulatory level Supervision.   Patient's care partner is independent to provide the necessary physical and cognitive assistance at discharge.  Reasons goals not met: ***  Recommendation:  Patient will benefit from ongoing skilled PT services in outpatient setting to continue to advance safe functional mobility, address ongoing impairments in strength, coordination, balance, activity tolerance, cognition, safety awareness, and to minimize fall risk.  Equipment: No equipment provided  Reasons for discharge: treatment goals met and discharge from hospital  Patient/family agrees with progress made and goals achieved: Yes  PT Discharge Precautions/Restrictions Precautions Precautions: Fall Precaution/Restrictions Comments: R hemi, L field cut Restrictions Weight Bearing Restrictions Per Provider Order: No Vital Signs Therapy Vitals Temp: 98 F (36.7 C) Temp Source: Oral Pulse Rate: 70 Resp: 16 BP: 130/65 Patient Position (if appropriate): Lying Oxygen Therapy SpO2: 98 % Pain ***   Pain Interference ***   Vision/Perception  Vision - History Ability to See in Adequate Light: 1 Impaired Vision - Assessment Tracking/Visual Pursuits: Decreased smoothness of horizontal tracking;Decreased smoothness of vertical tracking;Requires cues, head turns, or add eye shifts to track;Unable to hold eye position out of midline Saccades: Additional eye shifts occurred during testing;Additional head turns occurred during  testing;Decreased speed of saccadic movement Perception Perception: Impaired Preception Impairment Details: Inattention/Neglect Perception-Other Comments: bumps into obstacles in L periphery - but improving in scanning of environment with and without cues Praxis Praxis: Impaired 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  Cognition Overall Cognitive Status: Impaired/Different from baseline Arousal/Alertness: Awake/alert Orientation Level: Oriented X4 Attention: Sustained;Alternating Sustained Attention: Appears intact Alternating Attention: Impaired Memory: Impaired Memory Impairment: Decreased recall of new information;Retrieval deficit;Decreased long term memory;Decreased short term memory Awareness: Impaired Problem Solving: Impaired Behaviors: Impulsive;Poor frustration tolerance Safety/Judgment: Impaired Sensation Sensation Light Touch: Appears Intact Hot/Cold: Appears Intact Proprioception: Impaired by gross assessment (RUE>RLE) Coordination Gross Motor Movements are Fluid and Coordinated: No Fine Motor Movements are Fluid and Coordinated: No Heel Shin Test: Slower on R than L - slow to initiate Motor  Motor Motor: Hemiplegia Motor - Discharge Observations: min limb apraxia with RUE, mild apraxia overall and fluctuates  Mobility Bed Mobility Bed Mobility: Supine to Sit;Sit to Supine;Sitting - Scoot to Edge of Bed Supine to Sit: Independent Sitting - Scoot to [place] of Bed: Independent Sit to Supine: Independent Transfers Transfers: Sit to Stand;Stand to [employer] Transfers Sit to Stand: Independent Stand to Sit: Independent Stand Pivot Transfers: Supervision/Verbal cueing Stand Pivot Transfer Details: Verbal cues for precautions/safety Transfer (Assistive device): None Locomotion  Gait Gait Assistance: Supervision/Verbal cueing Assistive device: None Gait Assistance Details: Verbal cues for precautions/safety Gait Gait:  Yes Gait Pattern: Impaired Gait Pattern: Shuffle (stutter steps with pivot stepping, holds L knee in mild flexion) Gait velocity: 0.82 m/s = community ambulator High Level Ambulation High Level Ambulation: Backwards walking;Side stepping Side Stepping: wants to perform with turn into direction of travel and requires block to pelvis to maintain sidestep. Backwards Walking: initiates with R. then step-to with L until consistent vc/ tc to correct Stairs / Additional Locomotion Stairs: Yes Stairs Assistance: Contact Guard/Touching  assist Stair Management Technique: Two rails Height of Stairs: 6 Pick up small object from the floor assist level: Supervision/Verbal cueing Wheelchair Mobility Wheelchair Mobility: No  Trunk/Postural Assessment  Cervical Assessment Cervical Assessment: Within Functional Limits Thoracic Assessment Thoracic Assessment: Within Functional Limits Lumbar Assessment Lumbar Assessment: Exceptions to [facility] (slight posterior pelvic tilt in sitting) Postural Control Postural Control: Deficits on evaluation Trunk Control: HHA required to support balance with higher level and more dynamic balance challenges  Balance Balance Balance Assessed: Yes Standardized Balance Assessment Standardized Balance Assessment: Timed Up and Go Test;Berg Balance Test ( = 969' (gait speed = 0.82 m/s)) Berg Balance Test Sit to Stand: Able to stand  independently using hands Standing Unsupported: Able to stand safely 2 minutes Sitting with Back Unsupported but Feet Supported on Floor or Stool: Able to sit safely and securely 2 minutes Stand to Sit: Controls descent by using hands Transfers: Able to transfer safely, minor use of hands Standing Unsupported with Eyes Closed: Able to stand 10 seconds safely Standing Ubsupported with Feet Together: Able to place feet together independently and stand for 1 minute with supervision From Standing, Reach Forward with Outstretched Arm: Can reach  forward >12 cm safely (5") From Standing Position, Pick up Object from Floor: Able to pick up shoe, needs supervision From Standing Position, Turn to Look Behind Over each Shoulder: Turn sideways only but maintains balance Turn 360 Degrees: Able to turn 360 degrees safely but slowly Standing Unsupported, Alternately Place Feet on Step/Stool: Able to complete >2 steps/needs minimal assist Standing Unsupported, One Foot in Front: Able to take small step independently and hold 30 seconds Standing on One Leg: Able to lift leg independently and hold 5-10 seconds Total Score: 41 Timed Up and Go Test TUG: Normal TUG Normal TUG (seconds): 13.08 Static Sitting Balance Static Sitting - Balance Support: Feet unsupported;Feet supported Static Sitting - Level of Assistance: 7: Independent Dynamic Sitting Balance Dynamic Sitting - Balance Support: Feet supported Dynamic Sitting - Level of Assistance: 5: Stand by assistance Static Standing Balance Static Standing - Balance Support: During functional activity Static Standing - Level of Assistance: 6: Modified independent (Device/Increase time) Dynamic Standing Balance Dynamic Standing - Balance Support: During functional activity Dynamic Standing - Level of Assistance: 5: Stand by assistance Extremity Assessment  ***           Loel Dubonnet PT, DPT, CSRS 04/08/2023, 6:35 PM

## 2023-04-08 NOTE — Progress Notes (Addendum)
 Patient ID: Jacqueline Bonilla, female   DOB: November 20, 1955, 68 y.o.   MRN: [id number]  SW received updates from medical team pt desire to leave before Friday. Discharge date now Wednesday 4/9.   1012- SW spoke with pt husband to discuss above. He confirms family edu tomorrow. Preferred [facility] is [facility]. SW will provide updates as available.  SW waiting on follow-up from [facility] about HHPT/OT/SLP referral.  *referral accepted.   Cecile Sheerer, MSW, LCSW Office: [phone] Cell: [phone] Fax: [phone]

## 2023-04-08 NOTE — Progress Notes (Incomplete)
 Physical Therapy Session Note  Patient Details  Name: JOLEENA WEISENBURGER MRN: [id number] Date of Birth: 1955/06/29  Today's Date: 04/08/2023 PT Individual Time: 1032-1115 PT Individual Time Calculation (min): 43 min   Short Term Goals: Week 1:  PT Short Term Goal 1 (Week 1): STG = LTG d/t ELOS  Skilled Therapeutic Interventions/Progress Updates:  Patient seated upright in recliner on entrance to room. Husband present. Patient alert and agreeable to PT session.   Patient with no pain complaint at start of session.  Husband with questions re: pt's CLOF but related that this therapist has not worked with pt since evaluation. Will assess during session and relate to husband. Husband relates difficulty with pt performing stutter-stepping in pivot turns. Noted on eval and primary therapist continuing to address. Husband also relates pt's progress in aquatic therapy with previous OPPT. Pt states that getting prepped, dressed and then into/ out of pool makes sessions difficult. Okay with actual session.   Therapeutic Activity/ NMR: Transfers: Pt performed sit<>stand and stand pivot transfers throughout session with slight impulsivity, intermittent need for LE extension thrust into seated surface vs use of hands to assist with power up and controlling descent. Pt noted to use Parkinsonian-type stepping pattern during all stand pivot transfers. Blocked practice in single step turn of LE 90* to promote larger step. Pt with intermittent ability to follow multimodal cues and poor carryover into functional transfers.   Gait Training:  Pt ambulated 110' x2 ft using no AD with close supervision. Demonstrated good step lengths/ heights throughout with good balance. Provided vc for attn to step lengths.  Neuromuscular Re-ed: NMR facilitated during session with focus on ***. Pt guided in ***. NMR performed for improvements in motor control and coordination, balance, sequencing, judgement, and self confidence/  efficacy in performing all aspects of mobility at highest level of independence.   Therapeutic Exercise: Pt performed the following exercises with vc/ tc for proper technique. ***  Patient *** at end of session with brakes locked, *** alarm set, and all needs within reach.   Therapy Documentation Precautions:  Precautions Precautions: Fall Precaution/Restrictions Comments: R hemi Restrictions Weight Bearing Restrictions Per Provider Order: No  Pain: Pain Assessment Pain Scale: 0-10 Pain Score: 0-No pain  Balance: Berg Balance Test Sit to Stand: Able to stand  independently using hands Standing Unsupported: Able to stand safely 2 minutes Sitting with Back Unsupported but Feet Supported on Floor or Stool: Able to sit safely and securely 2 minutes Stand to Sit: Controls descent by using hands Transfers: Able to transfer safely, minor use of hands Standing Unsupported with Eyes Closed: Able to stand 10 seconds safely Standing Ubsupported with Feet Together: Able to place feet together independently and stand for 1 minute with supervision From Standing, Reach Forward with Outstretched Arm: Can reach forward >12 cm safely (5") From Standing Position, Pick up Object from Floor: Able to pick up shoe, needs supervision From Standing Position, Turn to Look Behind Over each Shoulder: Turn sideways only but maintains balance Turn 360 Degrees: Able to turn 360 degrees safely but slowly Standing Unsupported, Alternately Place Feet on Step/Stool: Able to complete >2 steps/needs minimal assist Standing Unsupported, One Foot in Front: Able to take small step independently and hold 30 seconds Standing on One Leg: Able to lift leg independently and hold 5-10 seconds Total Score: 41 Timed Up and Go Test TUG: Normal TUG Normal TUG (seconds): 13.08 Static Sitting Balance Static Sitting - Balance Support: Feet unsupported;Feet supported Static Sitting - Level  of Assistance: 7:  Independent Dynamic Sitting Balance Dynamic Sitting - Balance Support: Feet supported Dynamic Sitting - Level of Assistance: 5: Stand by assistance Static Standing Balance Static Standing - Balance Support: During functional activity Static Standing - Level of Assistance: 6: Modified independent (Device/Increase time) Dynamic Standing Balance Dynamic Standing - Balance Support: During functional activity Dynamic Standing - Level of Assistance: 5: Stand by assistance   Therapy/Group: Individual Therapy  Loel Dubonnet PT, DPT, CSRS 04/08/2023, 9:11 AM

## 2023-04-09 DIAGNOSIS — I63512 Cerebral infarction due to unspecified occlusion or stenosis of left middle cerebral artery: Secondary | ICD-10-CM | POA: Diagnosis not present

## 2023-04-09 NOTE — Progress Notes (Signed)
 Inpatient Rehabilitation Discharge Medication Review by a Pharmacist  A complete drug regimen review was completed for this patient to identify any potential clinically significant medication issues.  High Risk Drug Classes Is patient taking? Indication by Medication  Antipsychotic No   Anticoagulant Yes Librexia/placebo - CVA  Antibiotic No   Opioid No   Antiplatelet Yes Aspirin - CVA/DVT Brilinta - CVA/DVT  Hypoglycemics/insulin No   Vasoactive Medication Yes Losartan - HTN  Chemotherapy No   Other Yes Atorvastatin - dyslipidemia     Type of Medication Issue Identified Description of Issue Recommendation(s)  Drug Interaction(s) (clinically significant)     Duplicate Therapy     Allergy     No Medication Administration End Date     Incorrect Dose     Additional Drug Therapy Needed     Significant med changes from prior encounter (inform family/care partners about these prior to discharge).    Other       Clinically significant medication issues were identified that warrant physician communication and completion of prescribed/recommended actions by midnight of the next day:  No  Name of provider notified for urgent issues identified:   Provider Method of Notification:     Pharmacist comments:   Time spent performing this drug regimen review (minutes):  20   Jacqueline Bonilla 04/09/2023 8:56 AM

## 2023-04-09 NOTE — Progress Notes (Signed)
 Occupational Therapy Session Note  Patient Details  Name: Jacqueline Bonilla MRN: [id number] Date of Birth: 04-07-1955  Today's Date: 04/09/2023 OT Individual Time: 1300-1400 OT Individual Time Calculation (min): 60 min    Short Term Goals: Week 1:  OT Short Term Goal 1 (Week 1): STGs = LTGs  Skilled Therapeutic Interventions/Progress Updates:    1:1 Husband present for the session. NMR with focus on functional mobility in ADL kitchen with goal of bilateral UE tasks. Pt ambulated around the kitchen with min guard to min A. Obtained items (cup, plate, fork, spoon and knife and bowl with right hand) from different cabinets, carrying objects to set a table, including carrying water in a cup, washing hands at sink, loading dishwasher, navigating around smaller spaces, making postural adjustments and balance reactions with turns.   [facility] task practicing tying shoes as well as lacing a shoe with focus on bilateral hand coordination and perceptual skills,   In the hallway performed balance and balance reactions tasks with walking backwards, sideways in both directions, performing the "grapevine" and standing in the middle of 7 colored disks with goal of touching each one with Le (at a time).  All tasks required min A for dynamic balance and max instructional cues.  Pt returned to the room and left sitting in the recliner with safety pad on and husband at bedside.   Therapy Documentation Precautions:  Precautions Precautions: Fall Precaution/Restrictions Comments: R hemi, L field cut Restrictions Weight Bearing Restrictions Per Provider Order: No  Pain: Pain Assessment Pain Scale: 0-10 Pain Score: 0-No pain   Therapy/Group: Individual Therapy  Roney Mans [facility] 04/09/2023, 3:40 PM

## 2023-04-09 NOTE — Progress Notes (Signed)
 Occupational Therapy Session Note  Patient Details  Name: Jacqueline Bonilla MRN: [id number] Date of Birth: [birth year]  Today's Date: 04/09/2023 OT Individual Time: 1000-1045 OT Individual Time Calculation (min): 45 min    Short Term Goals: Week 1:  OT Short Term Goal 1 (Week 1): STGs = LTGs  Skilled Therapeutic Interventions/Progress Updates:    Pt and spouse in room ready for family education. Pt had completed a full shower yesterday so I updated him on her progress to a S level but continuing to need mod cues with bathing and dressing due to difficulty with R side awareness and coordination, spatial relations deficits,  bilateral integration challenges, decreased problem solving and processing.  Pt has made excellent progress with her balance and is now supervision to distant supervision with mobility.  Demonstrated to spouse activities pt can do at home: -pt stood at bed with bed raised to waist height to simulate washer height at home and practiced folding linens. She had great difficult with large towel,  min difficulty with pillowcase, and no difficulty with washcloths.  Discussed how she can use graded tasks at home starting with small items such as underwear or small tshirts to focus on bilateral intergration -standing at sink to pour water from cup to cup.  She does not have control or sensory awareness of R hand so when holding a styrofoam cup or water bottle she compresses it too much.  Used hard plastic cups from the kitchen. She had significant challenges with this activity. Needed hand over hand demonstration and with a great deal of practice began to be able to accomplish this task.  Pt stated she can't tell where her R hand is.   Discussed using gentle questioning cues vs direct instructions. And allowing pt time to process.    She does have an electric toothbrush at home and a shower seat.  Spouse feels comfortable with discharge tomorrow and pt is very eager.   Therapy  Documentation Precautions:  Precautions Precautions: Fall Precaution/Restrictions Comments: R hemi, L field cut Restrictions Weight Bearing Restrictions Per Provider Order: No   Pain: Pain Assessment Pain Score: 0-No pain ADL: ADL Eating: Set up Grooming: Supervision/safety Where Assessed-Grooming: Standing at sink Upper Body Bathing: Supervision/safety, Moderate cueing Where Assessed-Upper Body Bathing: Shower Lower Body Bathing: Supervision/safety Where Assessed-Lower Body Bathing: Shower Upper Body Dressing: Supervision/safety, Moderate cueing Where Assessed-Upper Body Dressing: Chair Lower Body Dressing: Supervision/safety, Moderate cueing Where Assessed-Lower Body Dressing: Chair Toileting: Supervision/safety, Moderate cueing Where Assessed-Toileting: Teacher, adult education: Close supervision Statistician Method: Proofreader: Acupuncturist: Close supervision Film/video editor Method: Designer, industrial/product: Grab bars, Shower seat with back  Therapy/Group: Individual Therapy  Fontaine Kossman 04/09/2023, 12:58 PM

## 2023-04-09 NOTE — Progress Notes (Signed)
 Occupational Therapy Discharge Summary  Patient Details  Name: Jacqueline Bonilla MRN: [id number] Date of Birth: 1955/06/07  Date of Discharge from OT service:April 09, 2023   Patient has met 10 of 11 long term goals due to improved activity tolerance, improved balance, postural control, ability to compensate for deficits, and functional use of  RIGHT upper extremity.  Patient to discharge at overall Supervision level.  Patient's care partner is independent to provide the necessary physical and cognitive assistance at discharge.   Family education completed today with spouse.   Reasons goals not met: pt continues to need set up with eating as she is not able to cut food or open containers due to impaired RUE praxis and bilateral integration   Recommendation:  Patient will benefit from ongoing skilled OT services in home health setting to continue to advance functional skills in the area of BADL.  Pt needs to work in her familiar home environment to improve motor planning and processing and then progress to outpt therapies.   Equipment: No equipment provided  Reasons for discharge: treatment goals met  Patient/family agrees with progress made and goals achieved: Yes  OT Discharge Precautions/Restrictions  Precautions Precaution/Restrictions Comments: R hemi, L field cut  ADL ADL Eating: Set up Grooming: Supervision/safety Where Assessed-Grooming: Standing at sink Upper Body Bathing: Supervision/safety, Moderate cueing Where Assessed-Upper Body Bathing: Shower Lower Body Bathing: Supervision/safety Where Assessed-Lower Body Bathing: Shower Upper Body Dressing: Supervision/safety, Moderate cueing Where Assessed-Upper Body Dressing: Chair Lower Body Dressing: Supervision/safety, Moderate cueing Where Assessed-Lower Body Dressing: Chair Toileting: Supervision/safety, Moderate cueing Where Assessed-Toileting: Teacher, adult education: Close supervision Toilet Transfer Method:  Proofreader: Acupuncturist: Close supervision Film/video editor Method: Designer, industrial/product: Grab bars, Information systems manager with back Vision Baseline Vision/History: 1 Wears glasses Patient Visual Report: Peripheral vision impairment Tracking/Visual Pursuits: Decreased smoothness of horizontal tracking;Decreased smoothness of vertical tracking;Requires cues, head turns, or add eye shifts to track;Unable to hold eye position out of midline Saccades: Additional eye shifts occurred during testing;Additional head turns occurred during testing;Decreased speed of saccadic movement Visual Fields: Left homonymous hemianopsia Perception   R inattention, spatial awareness deficits 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 Cognition Cognition Overall Cognitive Status: Impaired/Different from baseline Memory Impairment: Decreased recall of new information;Retrieval deficit;Decreased long term memory;Decreased short term memory Decreased Long Term Memory: Verbal basic;Functional basic Decreased Short Term Memory: Verbal basic;Functional basic Attention: Sustained;Alternating Sustained Attention: Appears intact Sustained Attention Impairment: Verbal basic Awareness: Impaired Awareness Impairment: Emergent impairment Problem Solving: Impaired Problem Solving Impairment: Verbal basic;Functional basic Executive Function: Sequencing;Organizing Sequencing: Impaired Organizing: Impaired Brief Interview for Mental Status (BIMS) Repetition of Three Words (First Attempt): 3 Temporal Orientation: Year: Correct Temporal Orientation: Month: Accurate within 5 days Temporal Orientation: Day: Correct Recall: "Sock": No, could not recall Recall: "Blue": Yes, no cue required Recall: "Bed": Yes, no cue required BIMS Summary Score: 13 Sensation Sensation Light Touch: Appears Intact Hot/Cold: Appears  Intact Proprioception: Impaired by gross assessment Stereognosis: Impaired by gross assessment Additional Comments: impaired in RUE Coordination Gross Motor Movements are Fluid and Coordinated: No Fine Motor Movements are Fluid and Coordinated: No Coordination and Movement Description: limited coordination and use of RUE in functional tasks; decreased smooth, coordinated movement in RLE. Finger Nose Finger Test: in both UEs, pt able to reach target with mod cues on how to do the test, demonstrates tremors Motor  Motor Motor - Discharge Observations: min limb apraxia with RUE, mild apraxia  overall and fluctuates Mobility    Supervision with basic mobility and bathroom transfers Trunk/Postural Assessment  Postural Control Trunk Control: HHA required to support balance with higher level and more dynamic balance challenges  Balance Dynamic Sitting Balance Dynamic Sitting - Level of Assistance: 5: Stand by assistance Static Standing Balance Static Standing - Level of Assistance: 6: Modified independent (Device/Increase time) Dynamic Standing Balance Dynamic Standing - Level of Assistance: 5: Stand by assistance Extremity/Trunk Assessment RUE Assessment RUE Assessment:  (pt is able to use R hand as a gross A if she is fully looking at her hand (such as using it to wash her L arm),  she is not able to use it well in bimanual tasks) Active Range of Motion (AROM) Comments: full AROM except for wrist extension only to neutral due to prior wrist fracture General Strength Comments: 3+/5 overall Brunstrum level for arm: Stage V Relative Independence from Synergy Brunstrum level for hand: Stage III Synergies performed voluntarily RUE Tone RUE Tone: Mild     SAGUIER,JULIA 04/09/2023, 1:15 PM

## 2023-04-09 NOTE — Progress Notes (Signed)
 Physical Therapy Session Note  Patient Details  Name: Jacqueline Bonilla MRN: [id number] Date of Birth: 11/21/1955  Today's Date: 04/09/2023 PT Individual Time: 1103-1200 PT Individual Time Calculation (min): 57 min   Short Term Goals: Week 1:  PT Short Term Goal 1 (Week 1): STG = LTG d/t ELOS   Skilled Therapeutic Interventions/Progress Updates:    Session focused on family education with pt and pt's husband with focus on preparation for home. Reviewed basic transfers, simulated car transfer, gait on ramp,mulched surface and curb for community mobility, bed mobility and transfers in ADL apartment, stair negotiation, floor transfers, and reviewed HEP. Discussed and education provided on overall safety awareness, fall risk, follow up therapies, and transition home. Pt and husband report feeling comfortable with her level of assist currently and overall progress and feel prepared for home. Pt performed mobility at overall modified independent to supervision level overall. During stair negotiation practiced with bilateral rails and cues for foot placement, but completed with overall close supervision. Floor transfer with CGA and verbal cues for technique and sequencing - pt's husband reports this was something they practiced regularly at home.    Access Code: [email or web address] URL: [email or web address] Date: 04/09/2023 Prepared by: Jill Side  Exercises - Supine Bridge  - 1 x daily - 7 x weekly - 3 sets - 10 reps - Supine Lower Trunk Rotation  - 1 x daily - 7 x weekly - 3 sets - 10 reps - Quadruped Alternating Arm Lift  - 1 x daily - 7 x weekly - 3 sets - 10 reps - Quadruped Rocking Backward  - 1 x daily - 7 x weekly - 3 sets - 10 reps - Quadruped Alternating Leg Extensions  - 1 x daily - 7 x weekly - 3 sets - 10 reps - Mini Squat with Counter Support  - 1 x daily - 7 x weekly - 3 sets - 10 reps - Side Stepping with Counter Support  - 1 x daily - 7 x weekly - 3 sets - 10 reps  Family and  pt deny any concerns at this time. Eager for d/c.  Therapy Documentation Precautions:  Precautions Precautions: Fall Precaution/Restrictions Comments: R hemi, L field cut Restrictions Weight Bearing Restrictions Per Provider Order: No    Pain: Pain Assessment Pain Scale: 0-10 Pain Score: 0-No pain     Therapy/Group: Individual Therapy  Karolee Stamps Darrol Poke, PT, DPT, CBIS  04/09/2023, 12:12 PM

## 2023-04-09 NOTE — Progress Notes (Signed)
 Speech Language Pathology Discharge Summary  Patient Details  Name: Jacqueline Bonilla MRN: [id number] Date of Birth: December 16, 1955  Date of Discharge from SLP service:April 09, 2023  Today's Date: 04/09/2023 SLP Individual Time: 7829-5621 SLP Individual Time Calculation (min): 31 min   Skilled Therapeutic Interventions: Pt seen in am for family education session. Pt was alert and seated upright in WC upon SLP arrival. Her husband arriving shortly after to participate in family education session. Pt's husband able to provide insight into pt's baseline cognitive function due to hx of CVAs. SLP discussed current role of SLP including assessment of dysphagia along with cognitive training. SLP further discussed POC and pt progress. Education completed on use of compensatory and adaptory methods for cognition due to baseline cognitive deficits with pt's husband able to add information on use of external aids throughout home to support pt cognition. SLP also provided education on general brain health recommendations and ways to support pt cognition through preferred task. Additional education provided on current diet recommendations as well as recommendations for standard swallowing strategies and s/s aspiration. Pt's husband an active participant though appeared well versed in pt's current cognitive abilities. Pt left upright in WC with call button within reach and her husband present. SLP to continue POC.   Patient has met 4 of 5 long term goals.  Patient to discharge at overall Min;Mod level.  Reasons goals not met: additional time required for recall goal   Clinical Impression/Discharge Summary: Pt has made steady gains and has met 4 of 5 LTG's this admission due to improved orientation, problem solving, attention, and awareness. Pt is currently an overall min to mod A for cognitive tasks and requires supervision cues for utilization of swallowing compensatory strategeis to minimize overt s/s aspiration  with regular solids and thin liquids. Pt/ family education complete and pt will discharge home with 24 hour supervision from family. Pt would benefit fom outpatient/ home health f/u SLP services to maximize cognition and ensure safe swallowing in order to maximize her functional independence.   Care Partner:  Caregiver Able to Provide Assistance: Yes  Type of Caregiver Assistance: Cognitive  Recommendation:  Home Health SLP;Outpatient SLP  Rationale for SLP Follow Up: Maximize cognitive function and independence;Maximize swallowing safety   Equipment: none   Reasons for discharge: Discharged from hospital;Treatment goals met   Patient/Family Agrees with Progress Made and Goals Achieved: Yes    Renaee Munda 04/09/2023, 3:13 PM

## 2023-04-09 NOTE — Plan of Care (Signed)
  Problem: RH Balance Goal: LTG Patient will maintain dynamic standing with ADLs (OT) Description: LTG:  Patient will maintain dynamic standing balance with assist during activities of daily living (OT)  Outcome: Completed/Met   Problem: RH Grooming Goal: LTG Patient will perform grooming w/assist,cues/equip (OT) Description: LTG: Patient will perform grooming with assist, with/without cues using equipment (OT) Outcome: Completed/Met   Problem: RH Bathing Goal: LTG Patient will bathe all body parts with assist levels (OT) Description: LTG: Patient will bathe all body parts with assist levels (OT) Outcome: Completed/Met   Problem: RH Dressing Goal: LTG Patient will perform upper body dressing (OT) Description: LTG Patient will perform upper body dressing with assist, with/without cues (OT). Outcome: Completed/Met Goal: LTG Patient will perform lower body dressing w/assist (OT) Description: LTG: Patient will perform lower body dressing with assist, with/without cues in positioning using equipment (OT) Outcome: Completed/Met   Problem: RH Toileting Goal: LTG Patient will perform toileting task (3/3 steps) with assistance level (OT) Description: LTG: Patient will perform toileting task (3/3 steps) with assistance level (OT)  Outcome: Completed/Met   Problem: RH Functional Use of Upper Extremity Goal: LTG Patient will use RT/LT upper extremity as a (OT) Description: LTG: Patient will use right/left upper extremity as a stabilizer/gross assist/diminished/nondominant/dominant level with assist, with/without cues during functional activity (OT) Outcome: Completed/Met   Problem: RH Toilet Transfers Goal: LTG Patient will perform toilet transfers w/assist (OT) Description: LTG: Patient will perform toilet transfers with assist, with/without cues using equipment (OT) Outcome: Completed/Met   Problem: RH Tub/Shower Transfers Goal: LTG Patient will perform tub/shower transfers w/assist  (OT) Description: LTG: Patient will perform tub/shower transfers with assist, with/without cues using equipment (OT) Outcome: Completed/Met   Problem: RH Awareness Goal: LTG: Patient will demonstrate awareness during functional activites type of (OT) Description: LTG: Patient will demonstrate awareness during functional activites type of (OT) Outcome: Completed/Met   Problem: RH Eating Goal: LTG Patient will perform eating w/assist, cues/equip (OT) Description: LTG: Patient will perform eating with assist, with/without cues using equipment (OT) 04/09/2023 1319 by Jevonte Clanton, Stark Bray, OT Outcome: Not Met (add Reason) Note: Pt needs set up A to open containers and cut food due to R hand impairments. 04/09/2023 1317 by Earle Gell, OT Outcome: Adequate for Discharge

## 2023-04-09 NOTE — Plan of Care (Signed)
  Problem: RH Swallowing Goal: LTG Patient will consume least restrictive diet using compensatory strategies with assistance (SLP) Description: LTG:  Patient will consume least restrictive diet using compensatory strategies with assistance (SLP) Outcome: Completed/Met Flowsheets (Taken 04/09/2023 1506) LTG: Pt Patient will consume least restrictive diet using compensatory strategies with assistance of (SLP): Supervision   Problem: RH Cognition - SLP Goal: RH LTG Patient will demonstrate orientation with cues Description:  LTG:  Patient will demonstrate orientation to person/place/time/situation with cues (SLP)   Outcome: Completed/Met Flowsheets Taken 04/09/2023 1506 by Renaee Munda, CCC-SLP LTG Patient will demonstrate orientation to: Time Taken 04/02/2023 1429 by Sockwell, Cassidi F, CCC-SLP LTG: Patient will demonstrate orientation using cueing (SLP): Minimal Assistance - Patient > 75%   Problem: RH Problem Solving Goal: LTG Patient will demonstrate problem solving for (SLP) Description: LTG:  Patient will demonstrate problem solving for basic/complex daily situations with cues  (SLP) Outcome: Completed/Met Flowsheets (Taken 04/02/2023 1429 by Sockwell, Cassidi F, CCC-SLP) LTG: Patient will demonstrate problem solving for (SLP): Basic daily situations LTG Patient will demonstrate problem solving for: Minimal Assistance - Patient > 75%   Problem: RH Memory Goal: LTG Patient will use memory compensatory aids to (SLP) Description: LTG:  Patient will use memory compensatory aids to recall biographical/new, daily complex information with cues (SLP) Outcome: Not Met (add Reason) Flowsheets (Taken 04/09/2023 1507) LTG: Patient will use memory compensatory aids to (SLP): Moderate Assistance - Patient 50 - 74% Note: Additional time required for goal achievement   Problem: RH Attention Goal: LTG Patient will demonstrate this level of attention during functional activites (SLP) Description: LTG:   Patient will will demonstrate this level of attention during functional activites (SLP) Outcome: Completed/Met Flowsheets (Taken 04/09/2023 1507) Patient will demonstrate during cognitive/linguistic activities the attention type of: Sustained LTG: Patient will demonstrate this level of attention during cognitive/linguistic activities with assistance of (SLP): Minimal Assistance - Patient > 75%   Problem: RH Awareness Goal: LTG: Patient will demonstrate awareness during functional activites type of (SLP) Description: LTG: Patient will demonstrate awareness during functional activites type of (SLP) Outcome: Completed/Met

## 2023-04-09 NOTE — Progress Notes (Signed)
 PROGRESS NOTE   Subjective/Complaints:   Pt excited about d/c in am  Dicsussed +THC on UDS.  Pt states husband gave her "gummies" at night to help her sleep.  States that he purchased them locally  LBM 4/7 ROS-  CP, SOB, N/V/D,   Objective:   No results found. No results for input(s): "WBC", "HGB", "HCT", "PLT" in the last 72 hours.  No results for input(s): "NA", "K", "CL", "CO2", "GLUCOSE", "BUN", "CREATININE", "CALCIUM" in the last 72 hours.   Intake/Output Summary (Last 24 hours) at 04/09/2023 0724 Last data filed at 04/08/2023 1819 Gross per 24 hour  Intake 480 ml  Output --  Net 480 ml        Physical Exam: Vital Signs Blood pressure 125/79, pulse 76, temperature 97.9 F (36.6 C), temperature source Oral, resp. rate 18, height 5\' 4"  (1.626 m), weight 51.7 kg, last menstrual period 01/02/2003, SpO2 100%.  General: No acute distress, sitting in WC Mood and affect are appropriate, pleasant  Heart: Regular rate and rhythm no rubs murmurs or extra sounds Lungs: Clear to auscultation, breathing unlabored, no rales or wheezes, on RA Abdomen: Positive bowel sounds, soft nontender to palpation, nondistended Extremities: No clubbing, cyanosis, or edema Skin: warm and dry  Neurologic: Cranial nerves II through XII intact, motor strength is 5/5 inLeft 4/5 RIght deltoid, bicep, tricep, grip, hip flexor, knee extensors, ankle dorsiflexor and plantar flexor Sensory exam normal sensation to light touch  Cerebellar exam mild dysmetria R FNF Musculoskeletal: Full range of motion in all 4 extremities. No joint swelling Exam stable 4/6    Assessment/Plan: 1. Functional deficits which require 3+ hours per day of interdisciplinary therapy in a comprehensive inpatient rehab setting. Physiatrist is providing close team supervision and 24 hour management of active medical problems listed below. Physiatrist and rehab team continue to  assess barriers to discharge/monitor patient progress toward functional and medical goals  Care Tool:  Bathing    Body parts bathed by patient: Right arm, Left arm, Chest, Abdomen, Front perineal area, Buttocks, Right upper leg, Face, Left lower leg, Right lower leg, Left upper leg         Bathing assist Assist Level: Contact Guard/Touching assist     Upper Body Dressing/Undressing Upper body dressing   What is the patient wearing?: Pull over shirt    Upper body assist Assist Level: Set up assist    Lower Body Dressing/Undressing Lower body dressing      What is the patient wearing?: Underwear/pull up, Pants     Lower body assist Assist for lower body dressing: Maximal Assistance - Patient 25 - 49%     Toileting Toileting    Toileting assist Assist for toileting: Supervision/Verbal cueing     Transfers Chair/bed transfer  Transfers assist     Chair/bed transfer assist level: Independent with assistive device Chair/bed transfer assistive device: Armrests   Locomotion Ambulation   Ambulation assist      Assist level: Supervision/Verbal cueing Assistive device: No Device Max distance: 975 ft   Walk 10 feet activity   Assist     Assist level: Supervision/Verbal cueing Assistive device: No Device   Walk 50 feet activity  Assist    Assist level: Supervision/Verbal cueing Assistive device: No Device    Walk 150 feet activity   Assist Walk 150 feet activity did not occur: Safety/medical concerns  Assist level: Supervision/Verbal cueing Assistive device: No Device    Walk 10 feet on uneven surface  activity   Assist Walk 10 feet on uneven surfaces activity did not occur: Safety/medical concerns   Assist level: Supervision/Verbal cueing Assistive device:  (no device)   Wheelchair     Assist Is the patient using a wheelchair?: Yes Type of Wheelchair: Manual    Wheelchair assist level: Total Assistance - Patient < 25% Max  wheelchair distance: 200 ft    Wheelchair 50 feet with 2 turns activity    Assist        Assist Level: Total Assistance - Patient < 25%   Wheelchair 150 feet activity     Assist      Assist Level: Total Assistance - Patient < 25%   Blood pressure 125/79, pulse 76, temperature 97.9 F (36.6 C), temperature source Oral, resp. rate 18, height 5\' 4"  (1.626 m), weight 51.7 kg, last menstrual period 01/02/2003, SpO2 100%.  Medical Problem List and Plan: 1. Functional deficits secondary to left MCA infarction status post TNK as well as history of left MCA infarction 2022 and received CIR             -patient may shower             -ELOS/Goals: 4/9 supervision goals  -Continue CIR PT, OT SLP 2.  Antithrombotics: -DVT/anticoagulation:  Mechanical: Antiembolism stockings, thigh (TED hose) Bilateral lower extremities             -antiplatelet therapy: Aspirin 81 mg daily and Brilinta 90 mg twice daily x 4 weeks then aspirin alone.  Patient enrolled in [place] stroke research study 3. Pain Management: Tylenol as needed 4. Mood/Behavior/Sleep: Provide emotional support             -antipsychotic agents: N/A 5. Neuropsych/cognition: This patient is not capable of making decisions on her own behalf. 6. Skin/Wound Care: Routine skin checks 7. Fluids/Electrolytes/Nutrition: Routine in and outs with follow-up chemistries    Latest Ref Rng & Units 04/02/2023    5:25 AM 03/27/2023    9:53 AM 03/27/2023    9:47 AM  BMP  Glucose 70 - 99 mg/dL 95  161  096   BUN 8 - 23 mg/dL 17  21  20    Creatinine 0.44 - 1.00 mg/dL 0.45  4.09  8.11   Sodium 135 - 145 mmol/L 138  140  138   Potassium 3.5 - 5.1 mmol/L 4.0  4.2  4.2   Chloride 98 - 111 mmol/L 104  103  103   CO2 22 - 32 mmol/L 24   27   Calcium 8.9 - 10.3 mg/dL 9.4   9.6     8.  Hyperlipidemia.  Lipitor .9.  History of thrombocytopenia.  Latest platelet count 226,000    Latest Ref Rng & Units 04/02/2023    5:25 AM 03/27/2023    9:53  AM 03/27/2023    9:47 AM  CBC  WBC 4.0 - 10.5 K/uL 4.0   5.0   Hemoglobin 12.0 - 15.0 g/dL 91.4  78.2  95.6   Hematocrit 36.0 - 46.0 % 36.3  40.0  39.9   Platelets 150 - 400 K/uL 226   262     10.  Urine drug screen positive THC.  Likely Delta-8  or CBD gummies.  Will see if husband can clarify, would avoid delta 8 given cognitive effects  11.  Bowel function.   -LBM 4/7  LOS: 8 days A FACE TO FACE EVALUATION WAS PERFORMED  [name] 04/09/2023, 7:24 AM

## 2023-04-10 DIAGNOSIS — I69319 Unspecified symptoms and signs involving cognitive functions following cerebral infarction: Secondary | ICD-10-CM

## 2023-04-10 DIAGNOSIS — I63512 Cerebral infarction due to unspecified occlusion or stenosis of left middle cerebral artery: Secondary | ICD-10-CM | POA: Diagnosis not present

## 2023-04-10 NOTE — Progress Notes (Signed)
 Inpatient Rehabilitation Care Coordinator Discharge Note   Patient Details  Name: Jacqueline Bonilla MRN: [id number] Date of Birth: [birth year]   Discharge location: D/c to home  Length of Stay: 8 days  Discharge activity level: CGA  Home/community participation: Limited  Patient response WU:JWJXBJ Literacy - How often do you need to have someone help you when you read instructions, pamphlets, or other written material from your doctor or pharmacy?: Often  Patient response YN:WGNFAO Isolation - How often do you feel lonely or isolated from those around you?: Rarely  Services provided included: MD, RD, PT, OT, SLP, CM, TR, Pharmacy, SW, Neuropsych, Industrial/product designer Services:  Field seismologist Utilized: [employer]  Choices offered to/list presented to: patient husband  Follow-up services arranged:  Home Health Home Health Agency: [facility] for HHPT/OT/SLP         Patient response to transportation need: Is the patient able to respond to transportation needs?: Yes In the past 12 months, has lack of transportation kept you from medical appointments or from getting medications?: No In the past 12 months, has lack of transportation kept you from meetings, work, or from getting things needed for daily living?: No   Patient/Family verbalized understanding of follow-up arrangements:  Yes  Individual responsible for coordination of the follow-up plan: contact pt husband Jacqueline Bonilla [phone]  Confirmed correct DME delivered: Gretchen Short 04/10/2023    Comments (or additional information):fam edu completed  Summary of Stay    Date/Time Discharge Planning CSW  04/10/23 0907 Pt to d/c to home with her husband. Fam edu completed. HHA- [facility] for HHPT/OT/SLP.  SW will confirm there are no barriers to discharge. AAC  04/03/23 1012 TBA. Per EMR, pt to d/c to home with her husband. SW will confirm there are no barriers to discharge. AAC       Keo Schirmer A  [name] Olszewski

## 2023-04-10 NOTE — Progress Notes (Signed)
 [facility] Summary Report / Loop Recorder

## 2023-04-10 NOTE — Plan of Care (Signed)
  Problem: Consults Goal: RH GENERAL PATIENT EDUCATION Description: See Patient Education module for education specifics. Outcome: Progressing   Problem: RH BOWEL ELIMINATION Goal: RH STG MANAGE BOWEL WITH ASSISTANCE Description: STG Manage Bowel with Assistance. Outcome: Progressing   Problem: RH BLADDER ELIMINATION Goal: RH STG MANAGE BLADDER WITH ASSISTANCE Description: STG Manage Bladder With Assistance Outcome: Progressing   Problem: RH SKIN INTEGRITY Goal: RH STG SKIN FREE OF INFECTION/BREAKDOWN Outcome: Progressing

## 2023-04-10 NOTE — Progress Notes (Signed)
 PROGRESS NOTE   Subjective/Complaints:  Patient feels ready to go home, aware of discharge date.  Husband will be picking her up  ROS-  CP, SOB, N/V/D,   Objective:   No results found. No results for input(s): "WBC", "HGB", "HCT", "PLT" in the last 72 hours.  No results for input(s): "NA", "K", "CL", "CO2", "GLUCOSE", "BUN", "CREATININE", "CALCIUM" in the last 72 hours.   Intake/Output Summary (Last 24 hours) at 04/10/2023 0718 Last data filed at 04/09/2023 1825 Gross per 24 hour  Intake 360 ml  Output --  Net 360 ml        Physical Exam: Vital Signs Blood pressure 132/84, pulse 77, temperature 97.7 F (36.5 C), temperature source Oral, resp. rate 16, height 5\' 4"  (1.626 m), weight 51.7 kg, last menstrual period 01/02/2003, SpO2 99%.  General: No acute distress, sitting in WC Mood and affect are appropriate, pleasant  Heart: Regular rate and rhythm no rubs murmurs or extra sounds Lungs: Clear to auscultation, breathing unlabored, no rales or wheezes, on RA Abdomen: Positive bowel sounds, soft nontender to palpation, nondistended Extremities: No clubbing, cyanosis, or edema Skin: warm and dry  Neurologic: Cranial nerves II through XII intact, motor strength is 5/5 inLeft 4/5 RIght deltoid, bicep, tricep, grip, hip flexor, knee extensors, ankle dorsiflexor and plantar flexor Sensory exam normal sensation to light touch  Cerebellar exam mild dysmetria R FNF Musculoskeletal: Full range of motion in all 4 extremities. No joint swelling     Assessment/Plan: 1. Functional deficits which require 3+ hours per day of interdisciplinary therapy in a comprehensive inpatient rehab setting. Physiatrist is providing close team supervision and 24 hour management of active medical problems listed below. Physiatrist and rehab team continue to assess barriers to discharge/monitor patient progress toward functional and medical  goals  Care Tool:  Bathing    Body parts bathed by patient: Right arm, Left arm, Chest, Abdomen, Front perineal area, Buttocks, Right upper leg, Face, Left lower leg, Right lower leg, Left upper leg         Bathing assist Assist Level: Contact Guard/Touching assist     Upper Body Dressing/Undressing Upper body dressing   What is the patient wearing?: Pull over shirt    Upper body assist Assist Level: Set up assist    Lower Body Dressing/Undressing Lower body dressing      What is the patient wearing?: Underwear/pull up, Pants     Lower body assist Assist for lower body dressing: Maximal Assistance - Patient 25 - 49%     Toileting Toileting    Toileting assist Assist for toileting: Supervision/Verbal cueing     Transfers Chair/bed transfer  Transfers assist     Chair/bed transfer assist level: Independent with assistive device Chair/bed transfer assistive device: Armrests   Locomotion Ambulation   Ambulation assist      Assist level: Supervision/Verbal cueing Assistive device: No Device Max distance: 975 ft   Walk 10 feet activity   Assist     Assist level: Supervision/Verbal cueing Assistive device: No Device   Walk 50 feet activity   Assist    Assist level: Supervision/Verbal cueing Assistive device: No Device    Walk 150  feet activity   Assist Walk 150 feet activity did not occur: Safety/medical concerns  Assist level: Supervision/Verbal cueing Assistive device: No Device    Walk 10 feet on uneven surface  activity   Assist Walk 10 feet on uneven surfaces activity did not occur: Safety/medical concerns   Assist level: Supervision/Verbal cueing Assistive device:  (no device)   Wheelchair     Assist Is the patient using a wheelchair?: No Type of Wheelchair: Manual    Wheelchair assist level: Total Assistance - Patient < 25% Max wheelchair distance: 200 ft    Wheelchair 50 feet with 2 turns  activity    Assist        Assist Level: Total Assistance - Patient < 25%   Wheelchair 150 feet activity     Assist      Assist Level: Total Assistance - Patient < 25%   Blood pressure 132/84, pulse 77, temperature 97.7 F (36.5 C), temperature source Oral, resp. rate 16, height 5\' 4"  (1.626 m), weight 51.7 kg, last menstrual period 01/02/2003, SpO2 99%.  Medical Problem List and Plan: 1. Functional deficits secondary to left MCA infarction status post TNK as well as history of left MCA infarction 2022 and received CIR             -patient may shower             -ELOS/Goals: 4/9 supervision goals  -Continue CIR PT, OT SLP 2.  Antithrombotics: -DVT/anticoagulation:  Mechanical: Antiembolism stockings, thigh (TED hose) Bilateral lower extremities             -antiplatelet therapy: Aspirin 81 mg daily and Brilinta 90 mg twice daily x 4 weeks then aspirin alone.  Patient enrolled in [place] stroke research study 3. Pain Management: Tylenol as needed 4. Mood/Behavior/Sleep: Provide emotional support             -antipsychotic agents: N/A 5. Neuropsych/cognition: This patient is not capable of making decisions on her own behalf. 6. Skin/Wound Care: Routine skin checks 7. Fluids/Electrolytes/Nutrition: Routine in and outs with follow-up chemistries    Latest Ref Rng & Units 04/02/2023    5:25 AM 03/27/2023    9:53 AM 03/27/2023    9:47 AM  BMP  Glucose 70 - 99 mg/dL 95  161  096   BUN 8 - 23 mg/dL 17  21  20    Creatinine 0.44 - 1.00 mg/dL 0.45  4.09  8.11   Sodium 135 - 145 mmol/L 138  140  138   Potassium 3.5 - 5.1 mmol/L 4.0  4.2  4.2   Chloride 98 - 111 mmol/L 104  103  103   CO2 22 - 32 mmol/L 24   27   Calcium 8.9 - 10.3 mg/dL 9.4   9.6     8.  Hyperlipidemia.  Lipitor .9.  History of thrombocytopenia.  Latest platelet count 226,000    Latest Ref Rng & Units 04/02/2023    5:25 AM 03/27/2023    9:53 AM 03/27/2023    9:47 AM  CBC  WBC 4.0 - 10.5 K/uL 4.0   5.0    Hemoglobin 12.0 - 15.0 g/dL 91.4  78.2  95.6   Hematocrit 36.0 - 46.0 % 36.3  40.0  39.9   Platelets 150 - 400 K/uL 226   262     10.  Urine drug screen positive THC.  Likely Delta-8 or CBD gummies.  Will see if husband can clarify, would avoid delta 8 given cognitive effects  11.  Bowel function.   -LBM 4/7  LOS: 9 days A FACE TO FACE EVALUATION WAS PERFORMED  [name] 04/10/2023, 7:18 AM

## 2023-04-11 ENCOUNTER — Telehealth: Payer: Self-pay | Admitting: Neurology

## 2023-04-11 NOTE — Telephone Encounter (Signed)
 I returned the call from patient's husband who stated that patient is having frequent minor nosebleeds after she sneezes ever since she left the hospital.  She is currently taking aspirin and Brilinta as well as Librexia stroke trial study medication( milvexian versus placebo ).  She stated that the bleeding was minor and not a problem and I gave her the choice of stopping this trial medication and if bleeding does not stop then stopping the Brilinta however the patient wants to continue the medication for now but if bleeding increases I have advised her to stop her stroke trial study medication and Brilinta and call us.  She voiced understanding.  Delia Heady, MD

## 2023-04-13 DIAGNOSIS — I69398 Other sequelae of cerebral infarction: Secondary | ICD-10-CM | POA: Diagnosis not present

## 2023-04-13 DIAGNOSIS — D696 Thrombocytopenia, unspecified: Secondary | ICD-10-CM | POA: Diagnosis not present

## 2023-04-13 DIAGNOSIS — F32A Depression, unspecified: Secondary | ICD-10-CM | POA: Diagnosis not present

## 2023-04-13 DIAGNOSIS — F419 Anxiety disorder, unspecified: Secondary | ICD-10-CM | POA: Diagnosis not present

## 2023-04-13 DIAGNOSIS — R2689 Other abnormalities of gait and mobility: Secondary | ICD-10-CM | POA: Diagnosis not present

## 2023-04-13 DIAGNOSIS — I10 Essential (primary) hypertension: Secondary | ICD-10-CM | POA: Diagnosis not present

## 2023-04-13 DIAGNOSIS — G319 Degenerative disease of nervous system, unspecified: Secondary | ICD-10-CM | POA: Diagnosis not present

## 2023-04-13 DIAGNOSIS — Z7902 Long term (current) use of antithrombotics/antiplatelets: Secondary | ICD-10-CM | POA: Diagnosis not present

## 2023-04-13 DIAGNOSIS — E785 Hyperlipidemia, unspecified: Secondary | ICD-10-CM | POA: Diagnosis not present

## 2023-04-15 ENCOUNTER — Ambulatory Visit (INDEPENDENT_AMBULATORY_CARE_PROVIDER_SITE_OTHER): Payer: Medicare HMO

## 2023-04-15 DIAGNOSIS — I639 Cerebral infarction, unspecified: Secondary | ICD-10-CM | POA: Diagnosis not present

## 2023-04-15 DIAGNOSIS — D696 Thrombocytopenia, unspecified: Secondary | ICD-10-CM | POA: Diagnosis not present

## 2023-04-15 DIAGNOSIS — Z7902 Long term (current) use of antithrombotics/antiplatelets: Secondary | ICD-10-CM | POA: Diagnosis not present

## 2023-04-15 DIAGNOSIS — E785 Hyperlipidemia, unspecified: Secondary | ICD-10-CM | POA: Diagnosis not present

## 2023-04-15 DIAGNOSIS — F32A Depression, unspecified: Secondary | ICD-10-CM | POA: Diagnosis not present

## 2023-04-15 DIAGNOSIS — I10 Essential (primary) hypertension: Secondary | ICD-10-CM | POA: Diagnosis not present

## 2023-04-15 DIAGNOSIS — R2689 Other abnormalities of gait and mobility: Secondary | ICD-10-CM | POA: Diagnosis not present

## 2023-04-15 DIAGNOSIS — I69398 Other sequelae of cerebral infarction: Secondary | ICD-10-CM | POA: Diagnosis not present

## 2023-04-15 DIAGNOSIS — F419 Anxiety disorder, unspecified: Secondary | ICD-10-CM | POA: Diagnosis not present

## 2023-04-15 DIAGNOSIS — G319 Degenerative disease of nervous system, unspecified: Secondary | ICD-10-CM | POA: Diagnosis not present

## 2023-04-16 ENCOUNTER — Telehealth: Payer: Self-pay | Admitting: Emergency Medicine

## 2023-04-16 DIAGNOSIS — I10 Essential (primary) hypertension: Secondary | ICD-10-CM | POA: Diagnosis not present

## 2023-04-16 DIAGNOSIS — R2689 Other abnormalities of gait and mobility: Secondary | ICD-10-CM | POA: Diagnosis not present

## 2023-04-16 DIAGNOSIS — E785 Hyperlipidemia, unspecified: Secondary | ICD-10-CM | POA: Diagnosis not present

## 2023-04-16 DIAGNOSIS — D696 Thrombocytopenia, unspecified: Secondary | ICD-10-CM | POA: Diagnosis not present

## 2023-04-16 DIAGNOSIS — G319 Degenerative disease of nervous system, unspecified: Secondary | ICD-10-CM | POA: Diagnosis not present

## 2023-04-16 DIAGNOSIS — F32A Depression, unspecified: Secondary | ICD-10-CM | POA: Diagnosis not present

## 2023-04-16 DIAGNOSIS — Z7902 Long term (current) use of antithrombotics/antiplatelets: Secondary | ICD-10-CM | POA: Diagnosis not present

## 2023-04-16 DIAGNOSIS — F419 Anxiety disorder, unspecified: Secondary | ICD-10-CM | POA: Diagnosis not present

## 2023-04-16 DIAGNOSIS — I69398 Other sequelae of cerebral infarction: Secondary | ICD-10-CM | POA: Diagnosis not present

## 2023-04-16 LAB — CUP PACEART REMOTE DEVICE CHECK
Date Time Interrogation Session: 20250413230447
Implantable Pulse Generator Implant Date: 20211020

## 2023-04-16 NOTE — Telephone Encounter (Signed)
 Copied from CRM [id number]. Topic: Clinical - Home Health Verbal Orders >> Apr 15, 2023  4:04 PM Adonis Hoot wrote: Caller/Agency: Jannette Mend Gasper [facility]st Callback Number: [phone] Service Requested: Physical Therapy Frequency: 3wk3  1wk3 Patient had another  stroke recently Any new concerns about the patient? No

## 2023-04-17 ENCOUNTER — Encounter: Payer: Self-pay | Admitting: Emergency Medicine

## 2023-04-17 ENCOUNTER — Ambulatory Visit (INDEPENDENT_AMBULATORY_CARE_PROVIDER_SITE_OTHER): Admitting: Emergency Medicine

## 2023-04-17 VITALS — BP 126/72 | HR 88 | Temp 97.9°F | Resp 18 | Ht 64.0 in | Wt 116.8 lb

## 2023-04-17 DIAGNOSIS — I63512 Cerebral infarction due to unspecified occlusion or stenosis of left middle cerebral artery: Secondary | ICD-10-CM

## 2023-04-17 DIAGNOSIS — F0150 Vascular dementia without behavioral disturbance: Secondary | ICD-10-CM

## 2023-04-17 DIAGNOSIS — E785 Hyperlipidemia, unspecified: Secondary | ICD-10-CM | POA: Diagnosis not present

## 2023-04-17 DIAGNOSIS — I10 Essential (primary) hypertension: Secondary | ICD-10-CM

## 2023-04-17 DIAGNOSIS — Z09 Encounter for follow-up examination after completed treatment for conditions other than malignant neoplasm: Secondary | ICD-10-CM

## 2023-04-17 NOTE — Assessment & Plan Note (Signed)
 Clinically stable.  Well-controlled hypertension on losartan. Presently on Brilinta 90 mg twice a day on daily baby aspirin Was also enrolled in study using Librexia 1 tablet twice a day or placebo.  Double-blind study. On cholesterol medication atorvastatin 40 mg daily Secondary stroke prevention measures discussed Fall precautions discussed Bleeding problems discussed

## 2023-04-17 NOTE — Assessment & Plan Note (Signed)
Stable chronic condition.  No concerns.

## 2023-04-17 NOTE — Assessment & Plan Note (Signed)
 BP Readings from Last 3 Encounters:  04/17/23 126/72  04/10/23 132/84  04/01/23 127/74   Well-controlled hypertension Continues losartan 12.5 mg daily

## 2023-04-17 NOTE — Assessment & Plan Note (Signed)
 Presently on atorvastatin 40 mg daily.

## 2023-04-17 NOTE — Progress Notes (Signed)
 Jacqueline Bonilla 68 y.o.   Chief Complaint  Patient presents with   Follow-up    Hospital follow up. Discharged 04/10/23    HISTORY OF PRESENT ILLNESS: This is a 67 y.o. female here for follow-up of hospital discharge. Accompanied by husband.  Overall doing better. 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  Atrophy. ASPECTS 10.    CTA head & neck distal left M2/proximal M3 stenosis. Moderate mid and severe distal right P2 stenose MRI  small acute left MCA infarct 2D Echo EF 60 to 65% Loop recorder interrogation no A-fib LDL 111 HgbA1c 5.7 UDS positive for THC VTE prophylaxis -Lovenox aspirin 81 mg daily prior to admission, continue aspirin 81 mg daily and Brilinta (ticagrelor) 90 mg bid for 4 weeks and then aspirin alone. (Patient has Plavix allergy). Patient enrolled in [place] stroke research study Therapy recommendations:  CIR Disposition: CIR today  HPI   Prior to Admission medications   Medication Sig Start Date End Date Taking? Authorizing Provider  acetaminophen (TYLENOL) 325 MG tablet Take 2 tablets (650 mg total) by mouth every 4 (four) hours as needed for mild pain  (pain score 1-3) (or temp > 37.5 C (99.5 F)). 04/08/23  Yes Angiulli, Mcarthur Rossetti, PA-C  Amino Acids Complex TABS Take 1 tablet by mouth daily.   Yes [provider]  aspirin 81 MG chewable tablet Chew 1 tablet (81 mg total) by mouth daily. 04/02/23  Yes Shafer, Ludger Nutting, NP  atorvastatin (LIPITOR) 40 MG tablet Take 1 tablet (40 mg total) by mouth daily. 04/08/23  Yes Angiulli, Mcarthur Rossetti, PA-C  CINNAMON PO Take 1 capsule by mouth daily.   Yes [provider]  Coenzyme Q10 (CO Q 10 PO) Take 1 capsule by mouth daily.   Yes [provider]  losartan (COZAAR) 25 MG tablet Take 0.5 tablets (12.5 mg total) by mouth daily. 04/08/23  Yes Angiulli, Mcarthur Rossetti, PA-C  MAGNESIUM PO Take 1 tablet by mouth at bedtime.   Yes [provider]  Multiple Vitamin (MULTIVITAMIN WITH MINERALS) TABS tablet Take 1 tablet by mouth daily.   Yes [provider]  Multiple Vitamins-Minerals (ZINC PO) Take 1 tablet by mouth daily.   Yes [provider]  Study - LIBREXIA-STROKE - milvexian 25 mg or placebo tablet (PI-Sethi) Take 1 tablet by mouth 2 (two) times daily. 04/08/23  Yes Angiulli, Mcarthur Rossetti, PA-C  ticagrelor (BRILINTA) 90 MG TABS tablet Take 1 tablet (90 mg total) by mouth 2 (two) times daily. 04/08/23  Yes Angiulli, Mcarthur Rossetti, PA-C    Allergies  Allergen Reactions   Avelox [Moxifloxacin] Hives   Ciprofloxacin Hives   Floraquin [Iodoquinol]     No cipro or others   Iohexol      Code: HIVES, Desc: HIVES S/P IVP MANY YRS AGO    Levaquin [Levofloxacin] Hives   Plavix [Clopidogrel] Diarrhea   Quinolones Hives   Shellfish Allergy Itching    Itching under the chin. Described by patient but not seen by husband.    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   Frequent falls 02/11/2023   Crushing injury of right hip 02/11/2023   Statin intolerance 04/24/2022   Internal hemorrhoids  01/10/2021   Dyslipidemia 12/29/2020   History of CVA (cerebrovascular accident) 07/14/2020   Normocytic anemia 07/11/2020   Vascular dementia without behavioral disturbance (HCC) 07/15/2019   History of COVID-19 04/22/2019   Essential hypertension 03/03/2019    Past Medical History:  Diagnosis Date   Anemia    Anxiety    Depression    Dysmenorrhea    Endometriosis    Fibroid    Hypertension    Implantable loop recorder present 2021   Microhematuria    negative workup   Osteoporosis    Tachycardia    Thrombocytopenia (HCC)     Past Surgical History:  Procedure Laterality Date   BREAST BIOPSY     BUBBLE STUDY  12/08/2020   Procedure: BUBBLE STUDY;  Surgeon: Wendall Stade, MD;  Location: [facility] ENDOSCOPY;  Service: Cardiovascular;;   CESAREAN SECTION     hysteroscopic resection     implantable loop recorder implant  10/21/2019   Medtronic Reveal Linq model LNQ 22 ([place] [id number]) implantable loop recorder   IR CT HEAD LTD  12/01/2020   IR PERCUTANEOUS ART THROMBECTOMY/INFUSION INTRACRANIAL INC DIAG ANGIO  12/01/2020   IR US GUIDE VASC ACCESS RIGHT  12/01/2020   ORIF HUMERUS FRACTURE Left 07/31/2021   Procedure: OPEN REDUCTION INTERNAL FIXATION (ORIF) DISTAL HUMERUS FRACTURE;  Surgeon: Roby Lofts, MD;  Location: [facility] OR;  Service: Orthopedics;  Laterality: Left;   RADIOLOGY WITH ANESTHESIA N/A 12/01/2020   Procedure: IR WITH ANESTHESIA - CODE STROKE;  Surgeon: Radiologist, Medication, MD;  Location: [facility] OR;  Service: Radiology;  Laterality: N/A;   TEE WITHOUT CARDIOVERSION N/A 12/08/2020   Procedure: TRANSESOPHAGEAL ECHOCARDIOGRAM (TEE);  Surgeon: Wendall Stade, MD;  Location: [facility] ENDOSCOPY;  Service: Cardiovascular;  Laterality: N/A;    Social History   Socioeconomic History   Marital status: Married    Spouse name: [name]   Number of children: 1   Years of education: Not on file   Highest education level: Not on file  Occupational History   Occupation: accounting    Occupation: Retired  Tobacco Use   Smoking status: Never   Smokeless tobacco: Never  Vaping Use   Vaping status: Never Used  Substance and Sexual Activity   Alcohol use: Never   Drug use: Never   Sexual activity: Not Currently  Partners: Male    Comment: husband vasectomy  Other Topics Concern   Not on file  Social History Narrative   Lives with husband   Grandchildren - 1   Works - Biochemist, 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%   Gun in home - yes - secured      Right handed   Caffeine: maybe tea every now and then   Social Drivers of [employer] Strain: Low Risk  (10/23/2022)   Overall Financial Resource Strain (CARDIA)    Difficulty of Paying Living Expenses: Not hard at all  Food Insecurity: No Food Insecurity (03/27/2023)   Hunger Vital Sign    Worried About Running Out of Food in the Last Year: Never true    Ran Out of Food in the Last Year: Never true  Transportation Needs: No Transportation Needs (03/27/2023)   PRAPARE - Administrator, Civil Service (Medical): No    Lack of Transportation (Non-Medical): No  Physical Activity: Sufficiently Active (10/23/2022)   Exercise Vital Sign    Days of Exercise per Week: 6 days    Minutes of Exercise per Session: 40 min  Stress: No Stress Concern Present (08/28/2021)   [employer] of Occupational Health - Occupational Stress Questionnaire    Feeling of Stress : Not at all  Social Connections: Moderately Isolated (03/27/2023)   Social Connection and Isolation Panel [NHANES]    Frequency of Communication with Friends and Family: Once a week    Frequency of Social Gatherings with Friends and Family: Once a week    Attends Religious Services: Never    Database administrator or Organizations: Yes    Attends Banker Meetings: Never    Marital Status: Married  Catering manager Violence: Not At Risk (03/27/2023)   Humiliation, Afraid, Rape, and Kick questionnaire    Fear of Current or Ex-Partner:  No    Emotionally Abused: No    Physically Abused: No    Sexually Abused: No    Family History  Problem Relation Age of Onset   Cancer Father        non hodgkin lymphoma & skin   Heart attack Maternal Grandfather    Dementia Mother    Polymyositis Sister      Review of Systems  Constitutional: Negative.  Negative for chills and fever.  HENT: Negative.  Negative for congestion and sore throat.   Respiratory: Negative.  Negative for cough and shortness of breath.   Cardiovascular: Negative.  Negative for chest pain and palpitations.  Gastrointestinal:  Negative for abdominal pain, diarrhea, nausea and vomiting.  Genitourinary: Negative.  Negative for dysuria and hematuria.  Skin: Negative.  Negative for rash.  Neurological:  Negative for dizziness and headaches.  All other systems reviewed and are negative.   Vitals:   04/17/23 1011  BP: 126/72  Pulse: 88  Resp: 18  Temp: 97.9 F (36.6 C)  SpO2: 99%    Physical Exam Vitals reviewed.  Constitutional:      Appearance: Normal appearance.  HENT:     Head: Normocephalic.  Eyes:     Extraocular Movements: Extraocular movements intact.     Conjunctiva/sclera: Conjunctivae normal.     Pupils: Pupils are equal, round, and reactive to light.  Cardiovascular:     Rate and Rhythm: Normal rate and regular rhythm.     Pulses: Normal pulses.     Heart sounds: Normal heart sounds.  Pulmonary:  Effort: Pulmonary effort is normal.     Breath sounds: Normal breath sounds.  Musculoskeletal:     Cervical back: No tenderness.  Lymphadenopathy:     Cervical: No cervical adenopathy.  Skin:    General: Skin is warm and dry.  Neurological:     Mental Status: She is alert and oriented to person, place, and time. Mental status is at baseline.  Psychiatric:        Mood and Affect: Mood normal.        Behavior: Behavior normal.      ASSESSMENT & PLAN: A total of 45 minutes was spent with the patient and  counseling/coordination of care regarding preparing for this visit, review of most recent office visit notes, review of most recent hospital discharge summary, review of multiple chronic medical conditions and their management, review of all medications, review of most recent bloodwork results, review of health maintenance items, education on nutrition, prognosis, documentation, and need for follow up.  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  Well-controlled hypertension on losartan. Presently on Brilinta 90 mg twice a day on daily baby aspirin Was also enrolled in study using Librexia 1 tablet twice a day or placebo.  Double-blind study. On cholesterol medication atorvastatin 40 mg daily Secondary stroke prevention measures discussed Fall precautions discussed Bleeding problems discussed         Nervous and Auditory   Vascular dementia without behavioral disturbance (HCC)   Stable chronic condition. No concerns.         Other   Dyslipidemia   Presently on atorvastatin 40 mg daily.      Other Visit Diagnoses       Hospital discharge follow-up          Patient Instructions  Stroke Prevention Some medical conditions and lifestyle choices can lead to a higher risk for a stroke. You can help to prevent a stroke by eating healthy foods and exercising. It also helps to not smoke and to manage any health problems you may have. How can this condition affect me? A stroke is an emergency. It should be treated right away. A stroke can lead to brain damage or threaten your life. There is a better chance of surviving and getting better after a stroke if you get medical help right away. What  can increase my risk? The following medical conditions may increase your risk of a stroke: Diseases of the heart and blood vessels (cardiovascular disease). High blood pressure (hypertension). Diabetes. High cholesterol. Sickle cell disease. Problems with blood clotting. Being very overweight. Sleeping problems (obstructivesleep apnea). Other risk factors include: Being older than age 105. A history of blood clots, stroke, or mini-stroke (TIA). Race, ethnic background, or a family history of stroke. Smoking or using tobacco products. Taking birth control pills, especially if you smoke. Heavy alcohol and drug use. Not being active. What actions can I take to prevent this? Manage your health conditions High cholesterol. Eat a healthy diet. If this is not enough to manage your cholesterol, you may need to take medicines. Take medicines as told by your doctor. High blood pressure. Try to keep your blood pressure below 130/80. If your blood  pressure cannot be managed through a healthy diet and regular exercise, you may need to take medicines. Take medicines as told by your doctor. Ask your doctor if you should check your blood pressure at home. Have your blood pressure checked every year. Diabetes. Eat a healthy diet and get regular exercise. If your blood sugar (glucose) cannot be managed through diet and exercise, you may need to take medicines. Take medicines as told by your doctor. Talk to your doctor about getting checked for sleeping problems. Signs of a problem can include: Snoring a lot. Feeling very tired. Make sure that you manage any other conditions you have. Nutrition  Follow instructions from your doctor about what to eat or drink. You may be told to: Eat and drink fewer calories each day. Limit how much salt (sodium) you use to 1,500 milligrams (mg) each day. Use only healthy fats for cooking, such as olive oil, canola oil, and sunflower oil. Eat healthy foods. To  do this: Choose foods that are high in fiber. These include whole grains, and fresh fruits and vegetables. Eat at least 5 servings of fruits and vegetables a day. Try to fill one-half of your plate with fruits and vegetables at each meal. Choose low-fat (lean) proteins. These include low-fat cuts of meat, chicken without skin, fish, tofu, beans, and nuts. Eat low-fat dairy products. Avoid foods that: Are high in salt. Have saturated fat. Have trans fat. Have cholesterol. Are processed or pre-made. Count how many carbohydrates you eat and drink each day. Lifestyle If you drink alcohol: Limit how much you have to: 0-1 drink a day for women who are not pregnant. 0-2 drinks a day for men. Know how much alcohol is in your drink. In the [place] one drink equals one 12 oz bottle of beer ( ), one 5 oz glass of wine ( ), or one 1 oz glass of hard liquor (44mL). Do not smoke or use any products that have nicotine or tobacco. If you need help quitting, ask your doctor. Avoid secondhand smoke. Do not use drugs. Activity  Try to stay at a healthy weight. Get at least 30 minutes of exercise on most days, such as: Fast walking. Biking. Swimming. Medicines Take over-the-counter and prescription medicines only as told by your doctor. Avoid taking birth control pills. Talk to your doctor about the risks of taking birth control pills if: You are over 66 years old. You smoke. You get very bad headaches. You have had a blood clot. Where to find more information American Stroke Association: www.strokeassociation.org Get help right away if: You or a loved one has any signs of a stroke. "BE FAST" is an easy way to remember the warning signs: B - Balance. Dizziness, sudden trouble walking, or loss of balance. E - Eyes. Trouble seeing or a change in how you see. F - Face. Sudden weakness or loss of feeling of the face. The face or eyelid may droop on one side. A - Arms. Weakness or loss of  feeling in an arm. This happens all of a sudden and most often on one side of the body. S - Speech. Sudden trouble speaking, slurred speech, or trouble understanding what people say. T - Time. Time to call emergency services. Write down what time symptoms started. You or a loved one has other signs of a stroke, such as: A sudden, very bad headache with no known cause. Feeling like you may vomit (nausea). Vomiting. A seizure. These symptoms may be an emergency. Get help  right away. Call your local emergency services (911 in the [place] Do not wait to see if the symptoms will go away. Do not drive yourself to the hospital. Summary You can help to prevent a stroke by eating healthy, exercising, and not smoking. It also helps to manage any health problems you have. Do not smoke or use any products that contain nicotine or tobacco. Get help right away if you or a loved one has any signs of a stroke. This information is not intended to replace advice given to you by your health care provider. Make sure you discuss any questions you have with your health care provider. Document Revised: 11/20/2021 Document Reviewed: 11/20/2021 Elsevier Patient Education  2024 Elsevier Inc.   Maryagnes Small, MD [facility]

## 2023-04-17 NOTE — Patient Instructions (Signed)
 Stroke Prevention Some medical conditions and lifestyle choices can lead to a higher risk for a stroke. You can help to prevent a stroke by eating healthy foods and exercising. It also helps to not smoke and to manage any health problems you may have. How can this condition affect me? A stroke is an emergency. It should be treated right away. A stroke can lead to brain damage or threaten your life. There is a better chance of surviving and getting better after a stroke if you get medical help right away. What can increase my risk? The following medical conditions may increase your risk of a stroke: Diseases of the heart and blood vessels (cardiovascular disease). High blood pressure (hypertension). Diabetes. High cholesterol. Sickle cell disease. Problems with blood clotting. Being very overweight. Sleeping problems (obstructivesleep apnea). Other risk factors include: Being older than age 70. A history of blood clots, stroke, or mini-stroke (TIA). Race, ethnic background, or a family history of stroke. Smoking or using tobacco products. Taking birth control pills, especially if you smoke. Heavy alcohol and drug use. Not being active. What actions can I take to prevent this? Manage your health conditions High cholesterol. Eat a healthy diet. If this is not enough to manage your cholesterol, you may need to take medicines. Take medicines as told by your doctor. High blood pressure. Try to keep your blood pressure below 130/80. If your blood pressure cannot be managed through a healthy diet and regular exercise, you may need to take medicines. Take medicines as told by your doctor. Ask your doctor if you should check your blood pressure at home. Have your blood pressure checked every year. Diabetes. Eat a healthy diet and get regular exercise. If your blood sugar (glucose) cannot be managed through diet and exercise, you may need to take medicines. Take medicines as told by your  doctor. Talk to your doctor about getting checked for sleeping problems. Signs of a problem can include: Snoring a lot. Feeling very tired. Make sure that you manage any other conditions you have. Nutrition  Follow instructions from your doctor about what to eat or drink. You may be told to: Eat and drink fewer calories each day. Limit how much salt (sodium) you use to 1,500 milligrams (mg) each day. Use only healthy fats for cooking, such as olive oil, canola oil, and sunflower oil. Eat healthy foods. To do this: Choose foods that are high in fiber. These include whole grains, and fresh fruits and vegetables. Eat at least 5 servings of fruits and vegetables a day. Try to fill one-half of your plate with fruits and vegetables at each meal. Choose low-fat (lean) proteins. These include low-fat cuts of meat, chicken without skin, fish, tofu, beans, and nuts. Eat low-fat dairy products. Avoid foods that: Are high in salt. Have saturated fat. Have trans fat. Have cholesterol. Are processed or pre-made. Count how many carbohydrates you eat and drink each day. Lifestyle If you drink alcohol: Limit how much you have to: 0-1 drink a day for women who are not pregnant. 0-2 drinks a day for men. Know how much alcohol is in your drink. In the [place] one drink equals one 12 oz bottle of beer ( ), one 5 oz glass of wine ( ), or one 1 oz glass of hard liquor (44mL). Do not smoke or use any products that have nicotine or tobacco. If you need help quitting, ask your doctor. Avoid secondhand smoke. Do not use drugs. Activity  Try to stay at a  healthy weight. Get at least 30 minutes of exercise on most days, such as: Fast walking. Biking. Swimming. Medicines Take over-the-counter and prescription medicines only as told by your doctor. Avoid taking birth control pills. Talk to your doctor about the risks of taking birth control pills if: You are over 16 years old. You smoke. You get  very bad headaches. You have had a blood clot. Where to find more information American Stroke Association: www.strokeassociation.org Get help right away if: You or a loved one has any signs of a stroke. "BE FAST" is an easy way to remember the warning signs: B - Balance. Dizziness, sudden trouble walking, or loss of balance. E - Eyes. Trouble seeing or a change in how you see. F - Face. Sudden weakness or loss of feeling of the face. The face or eyelid may droop on one side. A - Arms. Weakness or loss of feeling in an arm. This happens all of a sudden and most often on one side of the body. S - Speech. Sudden trouble speaking, slurred speech, or trouble understanding what people say. T - Time. Time to call emergency services. Write down what time symptoms started. You or a loved one has other signs of a stroke, such as: A sudden, very bad headache with no known cause. Feeling like you may vomit (nausea). Vomiting. A seizure. These symptoms may be an emergency. Get help right away. Call your local emergency services (911 in the [place] Do not wait to see if the symptoms will go away. Do not drive yourself to the hospital. Summary You can help to prevent a stroke by eating healthy, exercising, and not smoking. It also helps to manage any health problems you have. Do not smoke or use any products that contain nicotine or tobacco. Get help right away if you or a loved one has any signs of a stroke. This information is not intended to replace advice given to you by your health care provider. Make sure you discuss any questions you have with your health care provider. Document Revised: 11/20/2021 Document Reviewed: 11/20/2021 Elsevier Patient Education  2024 [employer].

## 2023-04-18 NOTE — Telephone Encounter (Signed)
 Spoke with Gena, gave verbal orders

## 2023-04-18 NOTE — Telephone Encounter (Signed)
 Okay to give verbal orders as requested.  Thanks.

## 2023-04-19 DIAGNOSIS — I10 Essential (primary) hypertension: Secondary | ICD-10-CM | POA: Diagnosis not present

## 2023-04-19 DIAGNOSIS — G319 Degenerative disease of nervous system, unspecified: Secondary | ICD-10-CM | POA: Diagnosis not present

## 2023-04-19 DIAGNOSIS — I69398 Other sequelae of cerebral infarction: Secondary | ICD-10-CM | POA: Diagnosis not present

## 2023-04-19 DIAGNOSIS — R2689 Other abnormalities of gait and mobility: Secondary | ICD-10-CM | POA: Diagnosis not present

## 2023-04-19 DIAGNOSIS — Z7902 Long term (current) use of antithrombotics/antiplatelets: Secondary | ICD-10-CM | POA: Diagnosis not present

## 2023-04-19 DIAGNOSIS — F419 Anxiety disorder, unspecified: Secondary | ICD-10-CM | POA: Diagnosis not present

## 2023-04-19 DIAGNOSIS — F32A Depression, unspecified: Secondary | ICD-10-CM | POA: Diagnosis not present

## 2023-04-19 DIAGNOSIS — D696 Thrombocytopenia, unspecified: Secondary | ICD-10-CM | POA: Diagnosis not present

## 2023-04-19 DIAGNOSIS — E785 Hyperlipidemia, unspecified: Secondary | ICD-10-CM | POA: Diagnosis not present

## 2023-04-23 ENCOUNTER — Encounter: Attending: Physical Medicine & Rehabilitation | Admitting: Physical Medicine & Rehabilitation

## 2023-04-23 ENCOUNTER — Encounter: Payer: Self-pay | Admitting: Physical Medicine & Rehabilitation

## 2023-04-23 VITALS — BP 148/90 | HR 73 | Ht 64.0 in

## 2023-04-23 DIAGNOSIS — I69351 Hemiplegia and hemiparesis following cerebral infarction affecting right dominant side: Secondary | ICD-10-CM | POA: Diagnosis not present

## 2023-04-23 DIAGNOSIS — G319 Degenerative disease of nervous system, unspecified: Secondary | ICD-10-CM | POA: Diagnosis not present

## 2023-04-23 DIAGNOSIS — D696 Thrombocytopenia, unspecified: Secondary | ICD-10-CM | POA: Diagnosis not present

## 2023-04-23 DIAGNOSIS — F0150 Vascular dementia without behavioral disturbance: Secondary | ICD-10-CM

## 2023-04-23 DIAGNOSIS — I69398 Other sequelae of cerebral infarction: Secondary | ICD-10-CM | POA: Diagnosis not present

## 2023-04-23 DIAGNOSIS — I69390 Apraxia following cerebral infarction: Secondary | ICD-10-CM | POA: Insufficient documentation

## 2023-04-23 DIAGNOSIS — F419 Anxiety disorder, unspecified: Secondary | ICD-10-CM | POA: Diagnosis not present

## 2023-04-23 DIAGNOSIS — R269 Unspecified abnormalities of gait and mobility: Secondary | ICD-10-CM | POA: Insufficient documentation

## 2023-04-23 DIAGNOSIS — F32A Depression, unspecified: Secondary | ICD-10-CM | POA: Diagnosis not present

## 2023-04-23 DIAGNOSIS — E785 Hyperlipidemia, unspecified: Secondary | ICD-10-CM | POA: Diagnosis not present

## 2023-04-23 DIAGNOSIS — Z7902 Long term (current) use of antithrombotics/antiplatelets: Secondary | ICD-10-CM | POA: Diagnosis not present

## 2023-04-23 DIAGNOSIS — I10 Essential (primary) hypertension: Secondary | ICD-10-CM | POA: Diagnosis not present

## 2023-04-23 DIAGNOSIS — R2689 Other abnormalities of gait and mobility: Secondary | ICD-10-CM | POA: Diagnosis not present

## 2023-04-23 NOTE — Patient Instructions (Signed)
 Please call for [facility] orders once Home health is done.  Call neurology for refills of Brillinta

## 2023-04-23 NOTE — Progress Notes (Signed)
 Subjective:    Patient ID: Jacqueline Bonilla, female    DOB: March 08, 1955, 68 y.o.   MRN: [id number]  68 y.o. 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  She did have a loop recorder placed at that time and maintained on aspirin .  She was taken off Brilinta  in February 2025.  Patient received inpatient rehab services 12/08/2020 - 12/17/2020 and was discharged to home contact-guard assist for ambulation.  Per chart review she lives with spouse.  1 level home 5 steps to entry.  Presented 03/27/2023 with aphasia altered mental status and unstable gait with frequent falls.  CT/MRI showed small acute left MCA infarction.  Severe chronic small vessel ischemic disease and cerebral atrophy.  CTA showed no large vessel occlusion.  She did receive TNK.  Admission chemistries unremarkable urine drug screen positive marijuana.  Echocardiogram with ejection fraction of 60 to 65% no wall motion abnormalities grade 1 diastolic dysfunction.  Neurology follow-up maintained on aspirin  as well as Brilinta  90 mg twice daily x 4 weeks then aspirin  alone with study drug followed by neurology services.  Tolerating a regular consistency diet.  Therapy evaluations completed due to patient decreased functional mobility was admitted for a comprehensive rehab program.   Admit date: 04/01/2023 Discharge date: 04/10/2023  HPI The patient been at home for approximately 2 weeks post discharge from rehabilitation.  Per her husband she is doing okay and has not fallen yet.  OT has come out and they stated that she was doing a little bit better than the last time they saw her prior to this most recent stroke.  On the other hand the husband has noted at least 2 episodes where the patient wiped herself after going to the bathroom and  just through the tissue on the floor rather than into the toilet.  He also states that she has a hard time aiming her bottom accurately onto the toilet seat. Social: Lives with husband in a 1 level home with 3 steps to enter. She is unable to do meal prep house duties shopping and needs some assistance with bathing and dressing.  Pain Inventory Average Pain 1 Pain Right Now 1 My pain is dull arthritic  LOCATION OF PAIN  wrist  BOWEL Number of stools per week: 7-15 Oral laxative use No   BLADDER Normal  Mobility walk without assistance  Function disabled: date disabled 2020 I need assistance with the following:  feeding, dressing, bathing, toileting, meal prep, household duties, and shopping  Neuro/Psych trouble walking dizziness confusion  Prior Studies Any changes since last visit?  no  Physicians involved in your care Any changes since last visit?  no   Family History  Problem Relation Age of Onset   Cancer Father        non hodgkin lymphoma & skin   Heart attack Maternal Grandfather    Dementia Mother    Polymyositis Sister    Social History   Socioeconomic History   Marital status: Married    Spouse name: Actor   Number of children: 1   Years of education: Not on file   Highest education level: Not on file  Occupational History   Occupation: accounting   Occupation: Retired  Tobacco Use   Smoking status: Never   Smokeless tobacco: Never  Vaping Use   Vaping status: Never Used  Substance and  Sexual Activity   Alcohol use: Never   Drug use: Never   Sexual activity: Not Currently    Partners: Male    Comment: husband vasectomy  Other Topics Concern   Not on file  Social History Narrative   Lives with husband   Grandchildren - 1   Works - Biochemist, 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%   Gun in home - yes - secured      Right handed   Caffeine: maybe tea every now and then   Social Drivers of [employer] Strain: Low Risk  (10/23/2022)    Overall Financial Resource Strain (CARDIA)    Difficulty of Paying Living Expenses: Not hard at all  Food Insecurity: No Food Insecurity (03/27/2023)   Hunger Vital Sign    Worried About Running Out of Food in the Last Year: Never true    Ran Out of Food in the Last Year: Never true  Transportation Needs: No Transportation Needs (03/27/2023)   PRAPARE - Administrator, Civil Service (Medical): No    Lack of Transportation (Non-Medical): No  Physical Activity: Sufficiently Active (10/23/2022)   Exercise Vital Sign    Days of Exercise per Week: 6 days    Minutes of Exercise per Session: 40 min  Stress: No Stress Concern Present (08/28/2021)   [employer] of Occupational Health - Occupational Stress Questionnaire    Feeling of Stress : Not at all  Social Connections: Moderately Isolated (03/27/2023)   Social Connection and Isolation Panel [NHANES]    Frequency of Communication with Friends and Family: Once a week    Frequency of Social Gatherings with Friends and Family: Once a week    Attends Religious Services: Never    Database administrator or Organizations: Yes    Attends Banker Meetings: Never    Marital Status: Married   Past Surgical History:  Procedure Laterality Date   BREAST BIOPSY     BUBBLE STUDY  12/08/2020   Procedure: BUBBLE STUDY;  Surgeon: Loyde Rule, MD;  Location: [facility] ENDOSCOPY;  Service: Cardiovascular;;   CESAREAN SECTION     hysteroscopic resection     implantable loop recorder implant  10/21/2019   Medtronic Reveal Linq model LNQ 22 ([place] [id number]) implantable loop recorder   IR CT HEAD LTD  12/01/2020   IR PERCUTANEOUS ART THROMBECTOMY/INFUSION INTRACRANIAL INC DIAG ANGIO  12/01/2020   IR US  GUIDE VASC ACCESS RIGHT  12/01/2020   ORIF HUMERUS FRACTURE Left 07/31/2021   Procedure: OPEN REDUCTION INTERNAL FIXATION (ORIF) DISTAL HUMERUS FRACTURE;  Surgeon: Laneta Pintos, MD;  Location: [facility] OR;  Service: Orthopedics;  Laterality:  Left;   RADIOLOGY WITH ANESTHESIA N/A 12/01/2020   Procedure: IR WITH ANESTHESIA - CODE STROKE;  Surgeon: Radiologist, Medication, MD;  Location: [facility] OR;  Service: Radiology;  Laterality: N/A;   TEE WITHOUT CARDIOVERSION N/A 12/08/2020   Procedure: TRANSESOPHAGEAL ECHOCARDIOGRAM (TEE);  Surgeon: Loyde Rule, MD;  Location: [facility] ENDOSCOPY;  Service: Cardiovascular;  Laterality: N/A;   Past Medical History:  Diagnosis Date   Anemia    Anxiety    Depression    Dysmenorrhea    Endometriosis    Fibroid    Hypertension    Implantable loop recorder present 2021   Microhematuria    negative workup   Osteoporosis    Tachycardia    Thrombocytopenia (HCC)    BP (!) 148/90   Pulse 73   Ht 5'  4" (1.626 m)   LMP 01/02/2003   SpO2 96%   BMI 20.05 kg/m   Opioid Risk Score:   Fall Risk Score:  `1  Depression screen [facility] 2/9     04/23/2023    1:56 PM 04/17/2023   10:18 AM 02/11/2023    1:04 PM 04/24/2022    9:22 AM 01/23/2022    2:04 PM 10/23/2021    2:23 PM 08/28/2021    9:58 AM  Depression screen PHQ 2/9  Decreased Interest 0 0 0 0 0 0 0  Down, Depressed, Hopeless 0 0 0 0 0 0 0  PHQ - 2 Score 0 0 0 0 0 0 0  Altered sleeping 0 0       Tired, decreased energy 1 3       Change in appetite 0 0       Feeling bad or failure about yourself  0 0       Trouble concentrating 0 0       Moving slowly or fidgety/restless 0 3       Suicidal thoughts 0 0       PHQ-9 Score 1 6       Difficult doing work/chores  Somewhat difficult         Review of Systems     Objective:   Physical Exam Vitals and nursing note reviewed.  Constitutional:      Appearance: She is normal weight.  HENT:     Head: Normocephalic and atraumatic.  Eyes:     Extraocular Movements: Extraocular movements intact.     Conjunctiva/sclera: Conjunctivae normal.     Pupils: Pupils are equal, round, and reactive to light.  Musculoskeletal:     Cervical back: Normal range of motion.     Right lower leg: No edema.      Left lower leg: No edema.  Skin:    General: Skin is warm and dry.     Coloration: Skin is not jaundiced.  Neurological:     Mental Status: She is alert.     Comments: Oriented to person and place Motor strength is 4/5 in the right deltoid spastic grip as well as hip flexors knee extensors ankle dorsiflexors 5/5 in the left deltoid, bicep, tricep, grip, hip flexors, knee extensors, ankle dorsiflexors and plantar flexors Sensation on the right side in the upper limb is intact to light touch in all fingers as well as with proprioception.  Sharp touch sensation in the thumb is mildly reduced compared to the other fingers. Finger thumb opposition is slowed on the right side compared to the left Mild dysmetria finger-nose-finger testing on the right side Ambulates without assistive device she has a wide-based support short step length and reduced knee flexion.  She also has reduced arm swing on the right side. She is unable to toe walk           Assessment & Plan:   1.  Recurrent left MCA distribution infarct which occurred about 1 month after being taken off of Brilinta .  The patient was enrolled in the [place] study however, has been felt that she was having a side effect from the medication and stopped the medication. The patient has an allergy or at least an intolerance to clopidogrel  which causes diarrhea.  She does tolerate aspirin . We discussed that neurology would be advising on stroke preventative medications.  She has seen both Dr. Narciso Backers as well as Dr. Janett Medin for this.  She does have a follow-up  appointment with Dr. Narciso Backers in July.  The patient has enough Brilinta  for the rest of the month but will need a refill. 2.  In terms of her rehabilitation we discussed apraxia as well as some issues which may be impacting her sequencing with toileting.  We discussed trying the left hand to wipe herself and she may not be stuck in terms of sequencing the next step. Also discussed that her home  PT can practice some "dry runs" with toileting. We also discussed that once home health PT has finished, would recommend outpatient PT and patient may benefit from aquatic therapy to work on higher-level balance. Patient should also work on floor transfers although fortunately she has not fallen since discharge. I like to see her back in 2 months

## 2023-04-26 DIAGNOSIS — E785 Hyperlipidemia, unspecified: Secondary | ICD-10-CM | POA: Diagnosis not present

## 2023-04-26 DIAGNOSIS — Z7902 Long term (current) use of antithrombotics/antiplatelets: Secondary | ICD-10-CM | POA: Diagnosis not present

## 2023-04-26 DIAGNOSIS — G319 Degenerative disease of nervous system, unspecified: Secondary | ICD-10-CM | POA: Diagnosis not present

## 2023-04-26 DIAGNOSIS — F32A Depression, unspecified: Secondary | ICD-10-CM | POA: Diagnosis not present

## 2023-04-26 DIAGNOSIS — R2689 Other abnormalities of gait and mobility: Secondary | ICD-10-CM | POA: Diagnosis not present

## 2023-04-26 DIAGNOSIS — D696 Thrombocytopenia, unspecified: Secondary | ICD-10-CM | POA: Diagnosis not present

## 2023-04-26 DIAGNOSIS — I10 Essential (primary) hypertension: Secondary | ICD-10-CM | POA: Diagnosis not present

## 2023-04-26 DIAGNOSIS — I69398 Other sequelae of cerebral infarction: Secondary | ICD-10-CM | POA: Diagnosis not present

## 2023-04-26 DIAGNOSIS — F419 Anxiety disorder, unspecified: Secondary | ICD-10-CM | POA: Diagnosis not present

## 2023-04-30 DIAGNOSIS — R2689 Other abnormalities of gait and mobility: Secondary | ICD-10-CM | POA: Diagnosis not present

## 2023-04-30 DIAGNOSIS — I69398 Other sequelae of cerebral infarction: Secondary | ICD-10-CM | POA: Diagnosis not present

## 2023-04-30 DIAGNOSIS — Z7902 Long term (current) use of antithrombotics/antiplatelets: Secondary | ICD-10-CM | POA: Diagnosis not present

## 2023-04-30 DIAGNOSIS — F419 Anxiety disorder, unspecified: Secondary | ICD-10-CM | POA: Diagnosis not present

## 2023-04-30 DIAGNOSIS — F32A Depression, unspecified: Secondary | ICD-10-CM | POA: Diagnosis not present

## 2023-04-30 DIAGNOSIS — G319 Degenerative disease of nervous system, unspecified: Secondary | ICD-10-CM | POA: Diagnosis not present

## 2023-04-30 DIAGNOSIS — D696 Thrombocytopenia, unspecified: Secondary | ICD-10-CM | POA: Diagnosis not present

## 2023-04-30 DIAGNOSIS — I10 Essential (primary) hypertension: Secondary | ICD-10-CM | POA: Diagnosis not present

## 2023-04-30 DIAGNOSIS — E785 Hyperlipidemia, unspecified: Secondary | ICD-10-CM | POA: Diagnosis not present

## 2023-05-01 ENCOUNTER — Other Ambulatory Visit (HOSPITAL_COMMUNITY): Payer: Self-pay | Admitting: Neurology

## 2023-05-01 ENCOUNTER — Telehealth: Payer: Self-pay | Admitting: Emergency Medicine

## 2023-05-01 MED ORDER — STUDY - LIBREXIA-STROKE - MILVEXIAN 25 MG OR PLACEBO TABLET (PI-SETHI): 1.0000 | ORAL_TABLET | Freq: Two times a day (BID) | ORAL | 0 refills | Status: DC

## 2023-05-01 NOTE — Telephone Encounter (Signed)
 Copied from CRM [id number]. Topic: General - Other >> May 01, 2023 12:06 PM Caliyah H wrote: Reason for CRM: Myrtie Bethel. Physical Therapist from [facility] called to report incident during therapy session. He wanted to inform Dr.Sagardia that while working with the patient during home health visit, the patient stumbled backward,landing on her buttocks and hitting her head on a hollow,shallow door. He assessed the patient for injury and asked if there was any broken skin or other injuries. The patient reported no visible injuries or broken skin.   Callback Number: [phone]

## 2023-05-03 DIAGNOSIS — I69398 Other sequelae of cerebral infarction: Secondary | ICD-10-CM | POA: Diagnosis not present

## 2023-05-03 DIAGNOSIS — Z7902 Long term (current) use of antithrombotics/antiplatelets: Secondary | ICD-10-CM | POA: Diagnosis not present

## 2023-05-03 DIAGNOSIS — G319 Degenerative disease of nervous system, unspecified: Secondary | ICD-10-CM | POA: Diagnosis not present

## 2023-05-03 DIAGNOSIS — D696 Thrombocytopenia, unspecified: Secondary | ICD-10-CM | POA: Diagnosis not present

## 2023-05-03 DIAGNOSIS — E785 Hyperlipidemia, unspecified: Secondary | ICD-10-CM | POA: Diagnosis not present

## 2023-05-03 DIAGNOSIS — F419 Anxiety disorder, unspecified: Secondary | ICD-10-CM | POA: Diagnosis not present

## 2023-05-03 DIAGNOSIS — R2689 Other abnormalities of gait and mobility: Secondary | ICD-10-CM | POA: Diagnosis not present

## 2023-05-03 DIAGNOSIS — I10 Essential (primary) hypertension: Secondary | ICD-10-CM | POA: Diagnosis not present

## 2023-05-03 DIAGNOSIS — F32A Depression, unspecified: Secondary | ICD-10-CM | POA: Diagnosis not present

## 2023-05-07 DIAGNOSIS — R2689 Other abnormalities of gait and mobility: Secondary | ICD-10-CM | POA: Diagnosis not present

## 2023-05-07 DIAGNOSIS — G319 Degenerative disease of nervous system, unspecified: Secondary | ICD-10-CM | POA: Diagnosis not present

## 2023-05-07 DIAGNOSIS — E785 Hyperlipidemia, unspecified: Secondary | ICD-10-CM | POA: Diagnosis not present

## 2023-05-07 DIAGNOSIS — F419 Anxiety disorder, unspecified: Secondary | ICD-10-CM | POA: Diagnosis not present

## 2023-05-07 DIAGNOSIS — F32A Depression, unspecified: Secondary | ICD-10-CM | POA: Diagnosis not present

## 2023-05-07 DIAGNOSIS — I10 Essential (primary) hypertension: Secondary | ICD-10-CM | POA: Diagnosis not present

## 2023-05-07 DIAGNOSIS — I69398 Other sequelae of cerebral infarction: Secondary | ICD-10-CM | POA: Diagnosis not present

## 2023-05-07 DIAGNOSIS — D696 Thrombocytopenia, unspecified: Secondary | ICD-10-CM | POA: Diagnosis not present

## 2023-05-07 DIAGNOSIS — Z7902 Long term (current) use of antithrombotics/antiplatelets: Secondary | ICD-10-CM | POA: Diagnosis not present

## 2023-05-14 ENCOUNTER — Telehealth: Payer: Self-pay | Admitting: Physical Medicine & Rehabilitation

## 2023-05-14 DIAGNOSIS — F419 Anxiety disorder, unspecified: Secondary | ICD-10-CM | POA: Diagnosis not present

## 2023-05-14 DIAGNOSIS — I10 Essential (primary) hypertension: Secondary | ICD-10-CM | POA: Diagnosis not present

## 2023-05-14 DIAGNOSIS — G319 Degenerative disease of nervous system, unspecified: Secondary | ICD-10-CM | POA: Diagnosis not present

## 2023-05-14 DIAGNOSIS — F32A Depression, unspecified: Secondary | ICD-10-CM | POA: Diagnosis not present

## 2023-05-14 DIAGNOSIS — I69398 Other sequelae of cerebral infarction: Secondary | ICD-10-CM | POA: Diagnosis not present

## 2023-05-14 DIAGNOSIS — E785 Hyperlipidemia, unspecified: Secondary | ICD-10-CM | POA: Diagnosis not present

## 2023-05-14 DIAGNOSIS — D696 Thrombocytopenia, unspecified: Secondary | ICD-10-CM | POA: Diagnosis not present

## 2023-05-14 DIAGNOSIS — Z7902 Long term (current) use of antithrombotics/antiplatelets: Secondary | ICD-10-CM | POA: Diagnosis not present

## 2023-05-14 DIAGNOSIS — I69351 Hemiplegia and hemiparesis following cerebral infarction affecting right dominant side: Secondary | ICD-10-CM

## 2023-05-14 DIAGNOSIS — R2689 Other abnormalities of gait and mobility: Secondary | ICD-10-CM | POA: Diagnosis not present

## 2023-05-14 NOTE — Telephone Encounter (Signed)
 Patient's husband called in requesting referral to re -start water therapy at [facility] patient will finish in home therapy 5/20

## 2023-05-20 ENCOUNTER — Ambulatory Visit: Payer: Self-pay | Admitting: Cardiology

## 2023-05-20 ENCOUNTER — Ambulatory Visit (INDEPENDENT_AMBULATORY_CARE_PROVIDER_SITE_OTHER): Payer: Medicare HMO

## 2023-05-20 DIAGNOSIS — I639 Cerebral infarction, unspecified: Secondary | ICD-10-CM

## 2023-05-20 LAB — CUP PACEART REMOTE DEVICE CHECK
Date Time Interrogation Session: 20250518230813
Implantable Pulse Generator Implant Date: 20211020

## 2023-05-22 DIAGNOSIS — E785 Hyperlipidemia, unspecified: Secondary | ICD-10-CM | POA: Diagnosis not present

## 2023-05-22 DIAGNOSIS — D696 Thrombocytopenia, unspecified: Secondary | ICD-10-CM | POA: Diagnosis not present

## 2023-05-22 DIAGNOSIS — I69398 Other sequelae of cerebral infarction: Secondary | ICD-10-CM | POA: Diagnosis not present

## 2023-05-22 DIAGNOSIS — R2689 Other abnormalities of gait and mobility: Secondary | ICD-10-CM | POA: Diagnosis not present

## 2023-05-22 DIAGNOSIS — I10 Essential (primary) hypertension: Secondary | ICD-10-CM | POA: Diagnosis not present

## 2023-05-22 DIAGNOSIS — F32A Depression, unspecified: Secondary | ICD-10-CM | POA: Diagnosis not present

## 2023-05-22 DIAGNOSIS — Z7902 Long term (current) use of antithrombotics/antiplatelets: Secondary | ICD-10-CM | POA: Diagnosis not present

## 2023-05-22 DIAGNOSIS — G319 Degenerative disease of nervous system, unspecified: Secondary | ICD-10-CM | POA: Diagnosis not present

## 2023-05-22 DIAGNOSIS — F419 Anxiety disorder, unspecified: Secondary | ICD-10-CM | POA: Diagnosis not present

## 2023-05-24 ENCOUNTER — Ambulatory Visit (INDEPENDENT_AMBULATORY_CARE_PROVIDER_SITE_OTHER): Admitting: Nurse Practitioner

## 2023-05-24 ENCOUNTER — Ambulatory Visit: Payer: Self-pay

## 2023-05-24 ENCOUNTER — Ambulatory Visit: Payer: Self-pay | Admitting: Nurse Practitioner

## 2023-05-24 VITALS — BP 120/70 | HR 97 | Temp 98.2°F | Ht 64.0 in | Wt 118.2 lb

## 2023-05-24 DIAGNOSIS — R197 Diarrhea, unspecified: Secondary | ICD-10-CM | POA: Diagnosis not present

## 2023-05-24 LAB — CBC WITH DIFFERENTIAL/PLATELET
Basophils Absolute: 0 10*3/uL (ref 0.0–0.1)
Basophils Relative: 0.7 % (ref 0.0–3.0)
Eosinophils Absolute: 0.1 10*3/uL (ref 0.0–0.7)
Eosinophils Relative: 1.3 % (ref 0.0–5.0)
HCT: 36.2 % (ref 36.0–46.0)
Hemoglobin: 11.8 g/dL — ABNORMAL LOW (ref 12.0–15.0)
Lymphocytes Relative: 20.5 % (ref 12.0–46.0)
Lymphs Abs: 1.3 10*3/uL (ref 0.7–4.0)
MCHC: 32.5 g/dL (ref 30.0–36.0)
MCV: 88.4 fl (ref 78.0–100.0)
Monocytes Absolute: 0.6 10*3/uL (ref 0.1–1.0)
Monocytes Relative: 9.1 % (ref 3.0–12.0)
Neutro Abs: 4.3 10*3/uL (ref 1.4–7.7)
Neutrophils Relative %: 68.4 % (ref 43.0–77.0)
Platelets: 261 10*3/uL (ref 150.0–400.0)
RBC: 4.1 Mil/uL (ref 3.87–5.11)
RDW: 14.8 % (ref 11.5–15.5)
WBC: 6.3 10*3/uL (ref 4.0–10.5)

## 2023-05-24 LAB — COMPREHENSIVE METABOLIC PANEL WITH GFR
ALT: 11 U/L (ref 0–35)
AST: 13 U/L (ref 0–37)
Albumin: 4.1 g/dL (ref 3.5–5.2)
Alkaline Phosphatase: 52 U/L (ref 39–117)
BUN: 29 mg/dL — ABNORMAL HIGH (ref 6–23)
CO2: 27 meq/L (ref 19–32)
Calcium: 9.4 mg/dL (ref 8.4–10.5)
Chloride: 106 meq/L (ref 96–112)
Creatinine, Ser: 1.05 mg/dL (ref 0.40–1.20)
GFR: 54.03 mL/min — ABNORMAL LOW (ref >60.00)
Glucose, Bld: 187 mg/dL — ABNORMAL HIGH (ref 70–99)
Potassium: 4.1 meq/L (ref 3.5–5.1)
Sodium: 140 meq/L (ref 135–145)
Total Bilirubin: 0.2 mg/dL (ref 0.2–1.2)
Total Protein: 7.7 g/dL (ref 6.0–8.3)

## 2023-05-24 NOTE — Telephone Encounter (Signed)
 Chief Complaint: Diarrhea Symptoms: None Frequency: Onset 6 days Pertinent Negatives: Patient denies other symptoms Disposition: [] ED /[] Urgent Care (no appt availability in office) / [x] Appointment(In office/virtual)/ []  [address] [facility]/ [] Home Care/ [] Refused Recommended Disposition /[] [facility]/ []  Follow-up with PCP Additional Notes: Patient's husband called and says she has diarrhea, unknown cause of the diarrhea x 6 days. He's been giving electrolytes with increased fluid intake, no loss of appetite, no vomiting, no abd pain/nausea. He says she has chronic dizziness/headache, so it's not new since diarrhea started. He says the falling is nothing new as well, chronic with her ongoing issue and PCP is aware, however he noticed more weakness since diarrhea. Advised no availability with PCP until 05/29/23, offered a different provider, he agrees, scheduled today with Adella Agee, NP.   Summary: diarrhea   Copied From CRM [id number]. Reason for Triage: Patient's husband calling because she has had diarrhea for 6 days, and he mentioned that she has been falling more often and has finally taken Dr. Hilaria Loveless advice and started to use a cane. Call back number: [phone]     Reason for Disposition  [1] MODERATE diarrhea (e.g., 4-6 times / day more than normal) AND [2] present > 48 hours (2 days)  Answer Assessment - Initial Assessment Questions 1. DIARRHEA SEVERITY: "How bad is the diarrhea?" "How many more stools have you had in the past 24 hours than normal?"    - NO DIARRHEA (SCALE 0)   - MILD (SCALE 1-3): Few loose or mushy BMs; increase of 1-3 stools over normal daily number of stools; mild increase in ostomy output.   -  MODERATE (SCALE 4-7): Increase of 4-6 stools daily over normal; moderate increase in ostomy output.   -  SEVERE (SCALE 8-10; OR "WORST POSSIBLE"): Increase of 7 or more stools daily over normal; moderate increase in ostomy output; incontinence.     4-5  times a day 2. ONSET: "When did the diarrhea begin?"      6 days ago 3. BM CONSISTENCY: "How loose or watery is the diarrhea?"      Loose 4. VOMITING: "Are you also vomiting?" If Yes, ask: "How many times in the past 24 hours?"      No 5. ABDOMEN PAIN: "Are you having any abdomen pain?" If Yes, ask: "What does it feel like?" (e.g., crampy, dull, intermittent, constant)      No 6. ABDOMEN PAIN SEVERITY: If present, ask: "How bad is the pain?"  (e.g., Scale 1-10; mild, moderate, or severe)   - MILD (1-3): doesn't interfere with normal activities, abdomen soft and not tender to touch    - MODERATE (4-7): interferes with normal activities or awakens from sleep, abdomen tender to touch    - SEVERE (8-10): excruciating pain, doubled over, unable to do any normal activities       No 7. ORAL INTAKE: If vomiting, "Have you been able to drink liquids?" "How much liquids have you had in the past 24 hours?"     Yes 8. HYDRATION: "Any signs of dehydration?" (e.g., dry mouth [not just dry lips], too weak to stand, dizziness, new weight loss) "When did you last urinate?"     Weakness, dizziness/headaches but not from diarrhea 9. EXPOSURE: "Have you traveled to a foreign country recently?" "Have you been exposed to anyone with diarrhea?" "Could you have eaten any food that was spoiled?"     No 10. ANTIBIOTIC USE: "Are you taking antibiotics now or have you taken antibiotics  in the past 2 months?"      No 11. OTHER SYMPTOMS: "Do you have any other symptoms?" (e.g., fever, blood in stool)       No  Protocols used: Diarrhea-A-AH

## 2023-05-24 NOTE — Telephone Encounter (Signed)
 Called by agent after they got off the phone with patient's husband. Asked to call patient back for triage. Attempted to call patient back but no voicemail set up to be able to leave a message.   Summary: diarrhea   Copied From CRM [id number]. Reason for Triage: Patient's husband calling because she has had diarrhea for 6 days, and he mentioned that she has been falling more often and has finally taken Dr. Hilaria Loveless advice and started to use a cane. Call back number: [phone]

## 2023-05-24 NOTE — Patient Instructions (Signed)
 Return to clinic if symptoms do not improve within the next week or so. We will notify you of lab results once they are available.

## 2023-05-24 NOTE — Assessment & Plan Note (Addendum)
 Acute, overall H&P reassuring, VSS Seems to be slowly resolving.  For now can consider following brat diet. Check CBC with differential, metabolic panel, GI PCR, and stool sample for ova and parasites. Because she is having 1-2 stools a day I would recommend against Imodium.  She was encouraged to also focus on hydration with water and possibly Pedialyte. Told that if she experiences fever, chills, blood in stool she should proceed to the ER.  She reports her understanding. Further recommendations may be made based upon lab work results.

## 2023-05-24 NOTE — Progress Notes (Signed)
 Established Patient Office Visit  Subjective   Patient ID: Jacqueline Bonilla, female    DOB: 1955/08/18  Age: 68 y.o. MRN: [id number]  Chief Complaint  Patient presents with   Diarrhea    Patient has today for acute visit for the above.  She is accompanied by her spouse. Has had diarrhea for 1 week.  Per spouse she has having 1-2 stools per day, consistency is loose, volume is not significantly changed.  No visible blood seen in the stool.  Patient is not sure if she is seeing mucus.  She is not having any abdominal pain, chills, fever, nausea, vomiting.  Not sure if she may have been exposed to Salmonella or other bacteria.  Spouse reports that today she had a semisolid stool which is an improvement.    Review of Systems  Constitutional:  Negative for chills and fever.  Gastrointestinal:  Positive for diarrhea. Negative for abdominal pain, blood in stool, nausea and vomiting.      Objective:     BP 120/70   Pulse 97   Temp 98.2 F (36.8 C) (Temporal)   Ht 5\' 4"  (1.626 m)   Wt 118 lb 4 oz (53.6 kg)   LMP 01/02/2003   SpO2 97%   BMI 20.30 kg/m  BP Readings from Last 3 Encounters:  05/24/23 120/70  04/23/23 (!) 148/90  04/17/23 126/72   Wt Readings from Last 3 Encounters:  05/24/23 118 lb 4 oz (53.6 kg)  04/17/23 116 lb 12.8 oz (53 kg)  04/01/23 113 lb 15.7 oz (51.7 kg)      Physical Exam Vitals reviewed.  Constitutional:      General: She is not in acute distress.    Appearance: Normal appearance.  HENT:     Head: Normocephalic and atraumatic.  Neck:     Vascular: No carotid bruit.  Cardiovascular:     Rate and Rhythm: Normal rate and regular rhythm.     Pulses: Normal pulses.     Heart sounds: Normal heart sounds.  Pulmonary:     Effort: Pulmonary effort is normal.     Breath sounds: Normal breath sounds.  Abdominal:     General: Abdomen is flat. Bowel sounds are increased. There is no distension.     Palpations: Abdomen is soft.     Tenderness:  There is no abdominal tenderness. There is no guarding.  Skin:    General: Skin is warm and dry.  Neurological:     General: No focal deficit present.     Mental Status: She is alert and oriented to person, place, and time.  Psychiatric:        Mood and Affect: Mood normal.        Behavior: Behavior normal.        Judgment: Judgment normal.      No results found for any visits on 05/24/23.    The ASCVD Risk score (Arnett DK, et al., 2019) failed to calculate for the following reasons:   Risk score cannot be calculated because patient has a medical history suggesting prior/existing ASCVD    Assessment & Plan:   Problem List Items Addressed This Visit       Other   Diarrhea - Primary   Acute, overall H&P reassuring, VSS Seems to be slowly resolving.  For now can consider following brat diet. Check CBC with differential, metabolic panel, GI PCR, and stool sample for ova and parasites. Because she is having 1-2 stools a day I would  recommend against Imodium.  She was encouraged to also focus on hydration with water and possibly Pedialyte. Told that if she experiences fever, chills, blood in stool she should proceed to the ER.  She reports her understanding. Further recommendations may be made based upon lab work results.      Relevant Orders   Comprehensive metabolic panel with GFR   CBC with Differential/Platelet   Ova and parasite examination   GI Profile, Stool, PCR    Return if symptoms worsen or fail to improve.    Zorita Hiss, NP

## 2023-05-28 ENCOUNTER — Telehealth: Payer: Self-pay | Admitting: Emergency Medicine

## 2023-05-28 NOTE — Telephone Encounter (Signed)
 Do not recommend iron infusion at this time.  Thanks.

## 2023-05-28 NOTE — Telephone Encounter (Signed)
 Copied from CRM [id number]. Topic: Clinical - Lab/Test Results >> May 28, 2023 12:32 PM Corin V wrote: Reason for CRM: Patient's husband Dorla Gartner would like to know if Dr. Vedia Geralds felt that patient would do well with iron infusion instead of just the pill. Please call him back to discuss [phone]

## 2023-05-29 ENCOUNTER — Telehealth: Payer: Self-pay

## 2023-05-29 DIAGNOSIS — Z8673 Personal history of transient ischemic attack (TIA), and cerebral infarction without residual deficits: Secondary | ICD-10-CM

## 2023-05-29 DIAGNOSIS — E785 Hyperlipidemia, unspecified: Secondary | ICD-10-CM

## 2023-05-29 MED ORDER — ATORVASTATIN CALCIUM 40 MG PO TABS: 40.0000 mg | ORAL_TABLET | Freq: Every day | ORAL | 1 refills | Status: AC

## 2023-05-29 NOTE — Telephone Encounter (Signed)
 This patient is appearing on a report for being at risk of failing the adherence measure for cholesterol (statin) medications this calendar year.   Medication: atorvastatin  40 mg Last fill date: 04/10/23 for 30 day supply  Spoke with patient's spouse who stated patient needs a refill of atorvastatin . Will collaborate with provider and embedded pharmacist to facilitate refill.   Hospitalized for a stroke at the beginning of April and the last script was prescribed on hospital discharge by rehab PA. Had hospital follow-up visit with PCP on 04/17/23.   Abelina Abide, PharmD PGY1 Pharmacy Resident 05/29/2023 10:41 AM

## 2023-05-29 NOTE — Telephone Encounter (Signed)
 Atorvastatin  refill sent.  Rainelle Bur, PharmD, BCPS, CPP Clinical Pharmacist Practitioner  Primary Care at [facility] [phone]

## 2023-05-30 NOTE — Addendum Note (Signed)
 Addended by: Edra Govern D on: 05/30/2023 09:30 AM   Modules accepted: Orders

## 2023-05-30 NOTE — Progress Notes (Signed)
 [facility] Summary Report / Loop Recorder

## 2023-06-03 ENCOUNTER — Ambulatory Visit: Attending: Physical Medicine & Rehabilitation | Admitting: Physical Therapy

## 2023-06-03 ENCOUNTER — Encounter: Payer: Self-pay | Admitting: Physical Therapy

## 2023-06-03 DIAGNOSIS — M6281 Muscle weakness (generalized): Secondary | ICD-10-CM | POA: Insufficient documentation

## 2023-06-03 DIAGNOSIS — I69351 Hemiplegia and hemiparesis following cerebral infarction affecting right dominant side: Secondary | ICD-10-CM | POA: Insufficient documentation

## 2023-06-03 DIAGNOSIS — R41844 Frontal lobe and executive function deficit: Secondary | ICD-10-CM | POA: Diagnosis not present

## 2023-06-03 DIAGNOSIS — R2681 Unsteadiness on feet: Secondary | ICD-10-CM | POA: Diagnosis not present

## 2023-06-03 DIAGNOSIS — R278 Other lack of coordination: Secondary | ICD-10-CM | POA: Diagnosis not present

## 2023-06-03 DIAGNOSIS — M25531 Pain in right wrist: Secondary | ICD-10-CM | POA: Diagnosis present

## 2023-06-03 DIAGNOSIS — I69398 Other sequelae of cerebral infarction: Secondary | ICD-10-CM | POA: Diagnosis not present

## 2023-06-03 DIAGNOSIS — R2689 Other abnormalities of gait and mobility: Secondary | ICD-10-CM | POA: Diagnosis not present

## 2023-06-03 DIAGNOSIS — R269 Unspecified abnormalities of gait and mobility: Secondary | ICD-10-CM | POA: Insufficient documentation

## 2023-06-03 DIAGNOSIS — R29818 Other symptoms and signs involving the nervous system: Secondary | ICD-10-CM | POA: Diagnosis not present

## 2023-06-03 DIAGNOSIS — R41840 Attention and concentration deficit: Secondary | ICD-10-CM | POA: Diagnosis not present

## 2023-06-03 DIAGNOSIS — R41842 Visuospatial deficit: Secondary | ICD-10-CM | POA: Diagnosis not present

## 2023-06-03 NOTE — Therapy (Signed)
 OUTPATIENT PHYSICAL THERAPY NEURO EVALUATION   Patient Name: Jacqueline Bonilla MRN: [id number] DOB:[birth year], 68 y.o., female Today's Date: 06/03/2023   PCP: Jacqueline Hammersmith, MD REFERRING PROVIDER: Genetta Kenning, MD  END OF SESSION:  PT End of Session - 06/03/23 1903     Visit Number 1    Number of Visits 17   combination of land/aquatic visits   Date for PT Re-Evaluation 08/02/23   pushed out 1 week due to scheduling   Authorization Type [employer]    Authorization Time Period 06-03-23 - 08-03-23    Progress Note Due on Visit 10    PT Start Time 0933    PT Stop Time 1016    PT Time Calculation (min) 43 min    Equipment Utilized During Treatment Gait belt    Activity Tolerance Patient tolerated treatment well    Behavior During Therapy [facility] for tasks assessed/performed             Past Medical History:  Diagnosis Date   Anemia    Anxiety    Depression    Dysmenorrhea    Endometriosis    Fibroid    Hypertension    Implantable loop recorder present 2021   Microhematuria    negative workup   Osteoporosis    Tachycardia    Thrombocytopenia (HCC)    Past Surgical History:  Procedure Laterality Date   BREAST BIOPSY     BUBBLE STUDY  12/08/2020   Procedure: BUBBLE STUDY;  Surgeon: Jacqueline Rule, MD;  Location: [facility] ENDOSCOPY;  Service: Cardiovascular;;   CESAREAN SECTION     hysteroscopic resection     implantable loop recorder implant  10/21/2019   Medtronic Reveal Linq model LNQ 22 ([place] [id number]) implantable loop recorder   IR CT HEAD LTD  12/01/2020   IR PERCUTANEOUS ART THROMBECTOMY/INFUSION INTRACRANIAL INC DIAG ANGIO  12/01/2020   IR US  GUIDE VASC ACCESS RIGHT  12/01/2020   ORIF HUMERUS FRACTURE Left 07/31/2021   Procedure: OPEN REDUCTION INTERNAL FIXATION (ORIF) DISTAL HUMERUS FRACTURE;  Surgeon: Jacqueline Pintos, MD;  Location: [facility] OR;  Service: Orthopedics;  Laterality: Left;   RADIOLOGY WITH ANESTHESIA N/A 12/01/2020   Procedure: IR WITH  ANESTHESIA - CODE STROKE;  Surgeon: Radiologist, Medication, MD;  Location: [facility] OR;  Service: Radiology;  Laterality: N/A;   TEE WITHOUT CARDIOVERSION N/A 12/08/2020   Procedure: TRANSESOPHAGEAL ECHOCARDIOGRAM (TEE);  Surgeon: Jacqueline Rule, MD;  Location: [facility] ENDOSCOPY;  Service: Cardiovascular;  Laterality: N/A;   Patient Active Problem List   Diagnosis Date Noted   Diarrhea 05/24/2023   Gait disturbance, 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   Hemiparesis affecting right side as late effect of cerebrovascular accident (CVA) (HCC) 04/23/2023   Apraxia, 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   Frequent falls 02/11/2023   Crushing injury of right hip 02/11/2023   Statin intolerance 04/24/2022   Internal hemorrhoids 01/10/2021   Dyslipidemia 12/29/2020   History of CVA (cerebrovascular accident) 07/14/2020   Normocytic anemia 07/11/2020   Vascular dementia without behavioral disturbance (HCC) 07/15/2019   History of COVID-19 04/22/2019   Essential hypertension 03/03/2019    ONSET DATE: 03-27-23 (Lt MCA CVA)  REFERRING DIAG: I69.398,R26.9 (ICD-10-CM) - Gait disturbance, post-stroke I69.351 (ICD-10-CM) - Hemiparesis affecting right side as late effect of cerebrovascular accident (CVA) (HCC)  THERAPY DIAG:  Hemiplegia and hemiparesis following cerebral infarction affecting right dominant side (HCC)  Other abnormalities of gait and mobility  Muscle weakness (generalized)  Unsteadiness on feet  Rationale for Evaluation and Treatment: Rehabilitation  SUBJECTIVE:                                                                                                                                                                                             SUBJECTIVE STATEMENT: Pt presents to PT accompanied by her husband, using [facility]; pt was admitted to [facility] hospital on 03-27-23 with Lt MCA infarcts; discharged to inpatient rehab on  04-01-23 and discharged home on 04-10-23.  Husband provides history and answers questions for pt. He reports pt had HH PT 2 days/week then frequency was decreased to 1 day/week -  finished up last week.  He reports pt has her own routine in which she does 10 squats every 45" for strengthening and also walks in hallway at home during the day.  Pt has h/o CVA (June 2022 and Dec. 2022) and previously received OP PT at [facility].  Husband reports pt was going to [facility] and doing water walking on treadmill in the pool at this facility and was going to [facility] for land-based PT and working on balance (prior to CVA in March 2025).  He also states that pt has been going to [facility] 1 day/week and riding recumbent bike but plans to start going 2 days/week.  Pt is referred to aquatic PT by Dr. Sharl Bonilla; husband requests order for OT as he reports pt is unable to independently perform her hygiene with toileting - says they have someone coming in to assist her with bathroom ADL's.   Pt accompanied by: spouse, Jacqueline Bonilla  PERTINENT HISTORY:  Per chart note 04-01-23 "Jacqueline Bonilla is a 68 y.o. 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  She did have a loop recorder placed at that time and maintained on aspirin .  She was taken off Brilinta  in February 2025.  Patient received inpatient rehab services 12/08/2020 - 12/17/2020 and was discharged to home contact-guard assist for ambulation.  Per chart review she lives with spouse.  1 level home 5 steps to entry.  Presented 03/27/2023 with aphasia altered mental status and unstable gait with frequent falls.  CT/MRI showed small acute left MCA infarction.  Severe chronic small vessel ischemic disease and cerebral atrophy.  CTA showed no large vessel occlusion.  She did  receive TNK.  Admission chemistries unremarkable urine drug screen positive marijuana.  Echocardiogram with ejection fraction of 60 to 65% no wall motion abnormalities grade  1 diastolic dysfunction.  Neurology follow-up maintained on aspirin  as well as Brilinta  90 mg twice daily x 4 weeks then aspirin  alone with study drug followed by neurology services.  Tolerating a regular consistency diet.  Therapy evaluations completed due to patient decreased functional mobility was admitted for a comprehensive rehab program."  Secondary diagnosis History of stroke History of seizure Hypertension Hyperlipidemia Substance use-marijuana Vascular dementia  h/o loop recorder implant Oct. 2021; h/o Lt humerus fracture July 2023    PAIN:  Are you having pain? Yes: NPRS scale: 3/10 Pain location: Rt wrist  Pain description: sore, discomfort Aggravating factors: moving it Relieving factors: at rest  - not moving it *will request order for OT from Dr. Sharl Bonilla - PT will not address wrist pain as out of scope of referral  PRECAUTIONS: Other: No driving  RED FLAGS: None   WEIGHT BEARING RESTRICTIONS: No  FALLS: Has patient fallen in last 6 months? Yes. Number of falls 12+ - some due to dizzy spells - due to change in medication (unsure if this is what caused stroke, per stroke)  LIVING ENVIRONMENT: Lives with: lives with their spouse Lives in: House/apartment Stairs: Yes: External: 3 steps; can reach both Has following equipment at home: Single point cane and Grab bars  PLOF: Independent with basic ADLs, Independent with household mobility without device, Independent with community mobility without device, and Needs assistance with homemaking  PATIENT GOALS: improve walking and balance  OBJECTIVE:  Note: Objective measures were completed at Evaluation unless otherwise noted.  DIAGNOSTIC FINDINGS: IMPRESSION:MRI Brain without contrast 03-28-23 Small acute infarct in the posterior left frontal  cortex. Some of the diffusion hyperintensity on yesterday's scan was reversible.   Advanced, widespread chronic ischemic injury with atrophy.    COGNITION: Overall cognitive status: Impaired and pt has vascular dementia per chart notes   SENSATION: WFL  COORDINATION: RLE WFL's; pt demonstrates festinating gait upon initial sit to stand with step initiation for gait  POSTURE: rounded shoulders and forward head  LOWER EXTREMITY ROM:   WFL's bil. LE's  LOWER EXTREMITY MMT:  LLE WNL's  MMT Right Eval Left Eval  Hip flexion 3+   Hip extension    Hip abduction    Hip adduction    Hip internal rotation    Hip external rotation    Knee flexion 4   Knee extension 5   Ankle dorsiflexion 4   Ankle plantarflexion    Ankle inversion    Ankle eversion    (Blank rows = not tested)  BED MOBILITY:  Findings: Independent  TRANSFERS: Sit to stand: Modified independence  Assistive device utilized: Single point cane     Pt able to perform sit to stand from mat without UE support  RAMP:  Not tested  CURB:  Not tested  STAIRS: Findings: Level of Assistance: SBA, Stair Negotiation Technique: Step to Pattern with Bilateral Rails, Number of Stairs: 4, Height of Stairs: 6   , and Comments: husband reports pt does not clear heel of Rt foot with step descension at their home GAIT: Findings: Gait Characteristics: pt demonstrates festinating gait for initial steps then amb. with, step through pattern, decreased arm swing- Right, lateral lean- Right, and decreased trunk rotation, Distance walked: 100', Assistive device utilized:Single point cane, Level of assistance: CGA, and Comments: festinating gait for initial steps when initiating ambulation; pt carried SPC from lobby to clinic gym - did not use consistently   FUNCTIONAL TESTS:  5 times sit to stand: 14.79 secs without  UE support Timed up and go (TUG): 15.75 secs without device 10 meter walk test: 12.82 secs without device = 2.56  ft/sec   [facility] PT Assessment - 06/03/23 0001       Berg Balance Test   Sit to Stand Able to stand without using hands and stabilize independently    Standing Unsupported Able to stand 2 minutes with supervision    Sitting with Back Unsupported but Feet Supported on Floor or Stool Able to sit safely and securely 2 minutes    Stand to Sit Sits safely with minimal use of hands    Transfers Able to transfer safely, minor use of hands    Standing Unsupported with Eyes Closed Unable to keep eyes closed 3 seconds but stays steady    Standing Unsupported with Feet Together Able to place feet together independently but unable to hold for 30 seconds            Berg not finished due to time constraint and pt's c/o fatigue - pt very labile after completing above Berg balance assessment                                                                                                                             TREATMENT DATE: 06-03-23  Self Care;  discussed need for OT referral as husband reports pt is dependent with ADL's and has decreased functional use of RUE and Rt hand  PATIENT EDUCATION: Education details: eval results, POC; discussed combination of land/aquatic PT sessions Person educated: Patient and Spouse Education method: Explanation and Demonstration Education comprehension: verbalized understanding  HOME EXERCISE PROGRAM: To be established   GOALS: Goals reviewed with patient? Yes  SHORT TERM GOALS: Target date: 06-28-23  Pt will increase Berg balance test score by at least 4 points to demo improved balance & reduced fall risk.  Baseline: Goal status: INITIAL  2.  Improve TUG score to </= 13.5 secs without device for reduced fall risk. Baseline: 15.75 secs without device Goal status: INITIAL  3.  Increase gait velocity to >/= 2.9 ft/sec without device for increased gait efficiency. Baseline: 2.56 ft/sec without device Goal status: INITIAL  4.  Pt will ambulate 350' on flat,  even surface with SBA with minimal festination of gait with initiation of steps. Baseline: moderate to severe festination with initiation during eval on 06-03-23 Goal status: INITIAL  5.  Improve DGA by at least 4 points to demo increased safety with ambulation. Baseline:  to be assessed Goal status: INITIAL  6.  Caregiver (husband) to assist pt with land HEP consisting of balance and strengthening exercises. Baseline:  Goal status: INITIAL  LONG TERM GOALS: Target date: 08-02-23  Pt will increase Berg balance test score by at least 8 points to demo improved balance & reduced fall risk.  Baseline:  Goal status: INITIAL  2.  Pt will amb. 500' without device with SBA on even and uneven surfaces with minimal gait deviations exhibited  and no LOB.   Baseline:  Goal status: INITIAL  3.  Increase DGI score by at least 8 points to demo improved balance and safety with ambulation.  Baseline: TBA Goal status: INITIAL  4.  Perform floor to stand transfer with UE support on object with supervision.  Baseline:  Goal status: INITIAL  5.  Husband will report pt's ability to negotiate steps at their home with use of rails without Rt heel catching edge of step. Baseline:  Goal status: INITIAL  6.  Husband to assist pt in performing updated HEP for land and aquatic based exercises for water walking, balance and strengthening.  Baseline:  Goal status: INITIAL  ASSESSMENT:  CLINICAL IMPRESSION: Patient is a 68 y.o. lady who was seen today for physical therapy evaluation and treatment for gait and balance impairments secondary to Lt MCA infarcts s/p TNK, etiology likely from Lt MCA stenosis per chart note.  Pt exhibits gait deviations including festinating gait upon initial steps for initiation of ambulation and decreased Rt arm swing.  Pt able to minimize deviations after ambulating approx. 10' for gait initiation.  Pt partially using SPC for assistance with ambulation but carried SPC approx. 50%  with amb. From clinic lobby to gym.  Pt has decreased endurance/activity tolerance and reported moderate fatigue at end of eval.  Berg balance test was initiated but was not completed due to time constraint and also due to pt's c/o fatigue.  Pt also had lability during PT eval (husband reported this has been worse with most recent CVA).  Pt presents with Rt hip flexor weakness, gait and balance deficits with TUG score 15.75 secs without device with SBA to CGA and decreased gait speed of 2.56 ft/sec.  Pt will benefit from PT to address RLE weakness, gait and balance impairments and decreased endurance.    OBJECTIVE IMPAIRMENTS: Abnormal gait, decreased activity tolerance, decreased balance, decreased cognition, decreased endurance, decreased strength, impaired tone, and impaired UE functional use.   ACTIVITY LIMITATIONS: carrying, lifting, bending, stairs, bathing, toileting, hygiene/grooming, and locomotion level  PARTICIPATION LIMITATIONS: meal prep, cleaning, laundry, driving, shopping, and community activity  PERSONAL FACTORS: Age, Behavior pattern, Past/current experiences, and 1-2 comorbidities: h/o previous CVA's and h/o vascular dementia are also affecting patient's functional outcome.   REHAB POTENTIAL: Good  CLINICAL DECISION MAKING: Evolving/moderate complexity  EVALUATION COMPLEXITY: Moderate  PLAN:  PT FREQUENCY: 2x/week  PT DURATION: 8 weeks + eval   PLANNED INTERVENTIONS: 97110-Therapeutic exercises, 97530- Therapeutic activity, [id number]- Neuromuscular re-education, [phone]- Self Care, 60454- Gait training, [phone]- Aquatic Therapy, and Patient/Family education  PLAN FOR NEXT SESSION: finish Berg balance test, DGI; issue HEP for balance; request order for OT from Dr. Sharl Bonilla   Cashlyn Huguley, Celeste Cola, PT 06/03/2023, 8:28 PM

## 2023-06-04 ENCOUNTER — Ambulatory Visit: Admitting: Physical Therapy

## 2023-06-04 DIAGNOSIS — R2689 Other abnormalities of gait and mobility: Secondary | ICD-10-CM | POA: Diagnosis not present

## 2023-06-04 DIAGNOSIS — R41842 Visuospatial deficit: Secondary | ICD-10-CM | POA: Diagnosis not present

## 2023-06-04 DIAGNOSIS — R41840 Attention and concentration deficit: Secondary | ICD-10-CM | POA: Diagnosis not present

## 2023-06-04 DIAGNOSIS — R2681 Unsteadiness on feet: Secondary | ICD-10-CM

## 2023-06-04 DIAGNOSIS — I69351 Hemiplegia and hemiparesis following cerebral infarction affecting right dominant side: Secondary | ICD-10-CM

## 2023-06-04 DIAGNOSIS — R278 Other lack of coordination: Secondary | ICD-10-CM | POA: Diagnosis not present

## 2023-06-04 DIAGNOSIS — R41844 Frontal lobe and executive function deficit: Secondary | ICD-10-CM | POA: Diagnosis not present

## 2023-06-04 DIAGNOSIS — R29818 Other symptoms and signs involving the nervous system: Secondary | ICD-10-CM | POA: Diagnosis not present

## 2023-06-04 DIAGNOSIS — M6281 Muscle weakness (generalized): Secondary | ICD-10-CM | POA: Diagnosis not present

## 2023-06-04 NOTE — Therapy (Unsigned)
 OUTPATIENT PHYSICAL THERAPY NEURO EVALUATION   Patient Name: Jacqueline Bonilla MRN: [id number] DOB:06-Oct-1955, 68 y.o., female Today's Date: 06/07/2023   PCP: Elvira Hammersmith, MD REFERRING PROVIDER: Genetta Kenning, MD  END OF SESSION:  PT End of Session - 06/07/23 1448     Visit Number 2    Number of Visits 17   combination of land/aquatic visits   Date for PT Re-Evaluation 08/02/23   pushed out 1 week due to scheduling   Authorization Type [employer]    Authorization Time Period 06-03-23 - 08-03-23    Progress Note Due on Visit 10    PT Start Time 0846    PT Stop Time 0930    PT Time Calculation (min) 44 min    Equipment Utilized During Treatment Gait belt    Activity Tolerance Patient tolerated treatment well    Behavior During Therapy [facility] for tasks assessed/performed              Past Medical History:  Diagnosis Date   Anemia    Anxiety    Depression    Dysmenorrhea    Endometriosis    Fibroid    Hypertension    Implantable loop recorder present 2021   Microhematuria    negative workup   Osteoporosis    Tachycardia    Thrombocytopenia (HCC)    Past Surgical History:  Procedure Laterality Date   BREAST BIOPSY     BUBBLE STUDY  12/08/2020   Procedure: BUBBLE STUDY;  Surgeon: Loyde Rule, MD;  Location: [facility] ENDOSCOPY;  Service: Cardiovascular;;   CESAREAN SECTION     hysteroscopic resection     implantable loop recorder implant  10/21/2019   Medtronic Reveal Linq model LNQ 22 ([place] [id number]) implantable loop recorder   IR CT HEAD LTD  12/01/2020   IR PERCUTANEOUS ART THROMBECTOMY/INFUSION INTRACRANIAL INC DIAG ANGIO  12/01/2020   IR US  GUIDE VASC ACCESS RIGHT  12/01/2020   ORIF HUMERUS FRACTURE Left 07/31/2021   Procedure: OPEN REDUCTION INTERNAL FIXATION (ORIF) DISTAL HUMERUS FRACTURE;  Surgeon: Laneta Pintos, MD;  Location: [facility] OR;  Service: Orthopedics;  Laterality: Left;   RADIOLOGY WITH ANESTHESIA N/A 12/01/2020   Procedure: IR WITH  ANESTHESIA - CODE STROKE;  Surgeon: Radiologist, Medication, MD;  Location: [facility] OR;  Service: Radiology;  Laterality: N/A;   TEE WITHOUT CARDIOVERSION N/A 12/08/2020   Procedure: TRANSESOPHAGEAL ECHOCARDIOGRAM (TEE);  Surgeon: Loyde Rule, MD;  Location: [facility] ENDOSCOPY;  Service: Cardiovascular;  Laterality: N/A;   Patient Active Problem List   Diagnosis Date Noted   Diarrhea 05/24/2023   Gait disturbance, 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   Hemiparesis affecting right side as late effect of cerebrovascular accident (CVA) (HCC) 04/23/2023   Apraxia, 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   Frequent falls 02/11/2023   Crushing injury of right hip 02/11/2023   Statin intolerance 04/24/2022   Internal hemorrhoids 01/10/2021   Dyslipidemia 12/29/2020   History of CVA (cerebrovascular accident) 07/14/2020   Normocytic anemia 07/11/2020   Vascular dementia without behavioral disturbance (HCC) 07/15/2019   History of COVID-19 04/22/2019   Essential hypertension 03/03/2019    ONSET DATE: 03-27-23 (Lt MCA CVA)  REFERRING DIAG: I69.398,R26.9 (ICD-10-CM) - Gait disturbance, post-stroke I69.351 (ICD-10-CM) - Hemiparesis affecting right side as late effect of cerebrovascular accident (CVA) (HCC)  THERAPY DIAG:  Hemiplegia and hemiparesis following cerebral infarction affecting right dominant side (HCC)  Other abnormalities of gait and mobility  Unsteadiness on feet  Rationale for Evaluation and Treatment: Rehabilitation  SUBJECTIVE:                                                                                                                                                                                             SUBJECTIVE STATEMENT: Pt presents to PT accompanied by her husband - pt noted to have less festination of gait upon initial sit to stand in lobby, to amb. Back to clinic gym; pt continues to carry [facility] but is not  really using cane for assistance with ambulation  Pt accompanied by: spouse, Jacqueline Bonilla  PERTINENT HISTORY:  Per chart note 04-01-23 "Jacqueline Bonilla is a 68 y.o. 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  She did have a loop recorder placed at that time and maintained on aspirin .  She was taken off Brilinta  in February 2025.  Patient received inpatient rehab services 12/08/2020 - 12/17/2020 and was discharged to home contact-guard assist for ambulation.  Per chart review she lives with spouse.  1 level home 5 steps to entry.  Presented 03/27/2023 with aphasia altered mental status and unstable gait with frequent falls.  CT/MRI showed small acute left MCA infarction.  Severe chronic small vessel ischemic disease and cerebral atrophy.  CTA showed no large vessel occlusion.  She did receive TNK.  Admission chemistries unremarkable urine drug screen positive marijuana.  Echocardiogram with ejection fraction of 60 to 65% no wall motion abnormalities grade 1 diastolic dysfunction.  Neurology follow-up maintained on aspirin  as well as Brilinta  90 mg twice daily x 4 weeks then aspirin  alone with study drug followed by neurology services.  Tolerating a regular consistency diet.  Therapy evaluations completed due to patient decreased functional mobility was admitted for a comprehensive rehab program."  Secondary diagnosis History of stroke History of seizure Hypertension Hyperlipidemia Substance use-marijuana Vascular dementia  h/o loop recorder implant Oct. 2021; h/o Lt humerus fracture July 2023    PAIN:  Are you having pain? Yes: NPRS scale: 3/10 Pain location: Rt wrist  Pain description: sore, discomfort Aggravating factors: moving it Relieving factors: at rest  - not moving it *will request  order for OT from Dr. Sharl Davies - PT will not address wrist pain as out of scope of referral  PRECAUTIONS: Other: No driving  RED FLAGS: None   WEIGHT BEARING RESTRICTIONS: No  FALLS: Has patient fallen in last 6 months? Yes. Number of falls 12+ - some due to dizzy spells - due  to change in medication (unsure if this is what caused stroke, per stroke)  LIVING ENVIRONMENT: Lives with: lives with their spouse Lives in: House/apartment Stairs: Yes: External: 3 steps; can reach both Has following equipment at home: Single point cane and Grab bars  PLOF: Independent with basic ADLs, Independent with household mobility without device, Independent with community mobility without device, and Needs assistance with homemaking  PATIENT GOALS: improve walking and balance  OBJECTIVE:  Note: Objective measures were completed at Evaluation unless otherwise noted.  DIAGNOSTIC FINDINGS: IMPRESSION:MRI Brain without contrast 03-28-23 Small acute infarct in the posterior left frontal cortex. Some of the diffusion hyperintensity on yesterday's scan was reversible.   Advanced, widespread chronic ischemic injury with atrophy.    COGNITION: Overall cognitive status: Impaired and pt has vascular dementia per chart notes   SENSATION: WFL  COORDINATION: RLE WFL's; pt demonstrates festinating gait upon initial sit to stand with step initiation for gait  POSTURE: rounded shoulders and forward head  LOWER EXTREMITY ROM:   WFL's bil. LE's  LOWER EXTREMITY MMT:  LLE WNL's  MMT Right Eval Left Eval  Hip flexion 3+   Hip extension    Hip abduction    Hip adduction    Hip internal rotation    Hip external rotation    Knee flexion 4   Knee extension 5   Ankle dorsiflexion 4   Ankle plantarflexion    Ankle inversion    Ankle eversion    (Blank rows = not tested)  BED MOBILITY:  Findings: Independent  TRANSFERS: Sit to stand: Modified independence  Assistive device utilized: Single point  cane     Pt able to perform sit to stand from mat without UE support  RAMP:  Not tested  CURB:  Not tested  STAIRS: Findings: Level of Assistance: SBA, Stair Negotiation Technique: Step to Pattern with Bilateral Rails, Number of Stairs: 4, Height of Stairs: 6   , and Comments: husband reports pt does not clear heel of Rt foot with step descension at their home GAIT: Findings: Gait Characteristics: pt demonstrates festinating gait for initial steps then amb. with, step through pattern, decreased arm swing- Right, lateral lean- Right, and decreased trunk rotation, Distance walked: 100', Assistive device utilized:Single point cane, Level of assistance: CGA, and Comments: festinating gait for initial steps when initiating ambulation; pt carried SPC from lobby to clinic gym - did not use consistently   FUNCTIONAL TESTS:  5 times sit to stand: 14.79 secs without UE support Timed up and go (TUG): 15.75 secs without device 10 meter walk test: 12.82 secs without device = 2.56 ft/sec     [facility] PT Assessment - 06/03/23 0001                Berg Balance Test    Sit to Stand Able to stand without using hands and stabilize independently     Standing Unsupported Able to stand 2 minutes with supervision     Sitting with Back Unsupported but Feet Supported on Floor or Stool Able to sit safely and securely 2 minutes     Stand to Sit Sits safely with minimal use of hands     Transfers Able to transfer safely, minor use of hands     Standing Unsupported with Eyes Closed Unable to keep eyes closed 3 seconds but stays steady     Standing Unsupported with Feet Together Able to place feet together independently but unable to hold for 30 seconds  Berg not finished due to time constraint and pt's c/o fatigue - pt very labile after completing above Berg balance assessment                                                                                                                              TREATMENT DATE: 06-04-23   NeuroRe-ed:    06/04/23 0001  Berg Balance Test  Sit to Stand 4  Standing Unsupported 3  Sitting with Back Unsupported but Feet Supported on Floor or Stool 4  Stand to Sit 4  Transfers 4  Standing Unsupported with Eyes Closed 1  Standing Unsupported with Feet Together 2  From Standing, Reach Forward with Outstretched Arm 3  From Standing Position, Pick up Object from Floor 3  From Standing Position, Turn to Look Behind Over each Shoulder 2  Turn 360 Degrees 1  Standing Unsupported, Alternately Place Feet on Step/Stool 1  Standing Unsupported, One Foot in Front 2  Standing on One Leg 1  Total Score 35   Above bolded items scored in today's session as not completed during initial eval due to time constraint & pt's c/o fatigue  HEP: Balance HEP Access Code: [email or web address] URL: [email or web address] Date: 06/07/2023 Prepared by: Johnnette Nakayama  Exercises - Standing Hip Flexion with Counter Support  - 1 x daily - 7 x weekly - 1 sets - 10 reps - Standing Hip Abduction with Counter Support  - 1 x daily - 7 x weekly - 1 sets - 10 reps - Standing March with Counter Support  - 1 x daily - 7 x weekly - 1 sets - 10 reps - Standing Hip Extension with Counter Support  - 1 x daily - 7 x weekly - 1 sets - 10 reps  Also - added touching 2 targets (paper plate or solo cup) in standing with assist of husband or standing at counter; 5 reps each leg - 1x/day  Pt performed touching 3 balance bubbles - 3 reps each foot - with mod HHA from PT  Gait:  06/04/23 0001  Dynamic Gait Index  Level Surface 1  Change in Gait Speed 2  Gait with Horizontal Head Turns 2  Gait with Vertical Head Turns 1  Gait and Pivot Turn 2  Step Over Obstacle 1  Step Around Obstacles 2  Steps 2  Total Score 13        PATIENT EDUCATION: Education details: eval results, POC; discussed combination of land/aquatic PT sessions Person educated: Patient and Spouse Education method:  Explanation and Demonstration Education comprehension: verbalized understanding  HOME EXERCISE PROGRAM: To be established   GOALS: Goals reviewed with patient? Yes  SHORT TERM GOALS: Target date: 06-28-23  Pt will increase Berg balance test score by at least 4 points to demo improved balance & reduced fall risk.  Baseline: Goal status: INITIAL  2.  Improve TUG score to </= 13.5 secs without device for reduced fall risk. Baseline: 15.75 secs without  device Goal status: INITIAL  3.  Increase gait velocity to >/= 2.9 ft/sec without device for increased gait efficiency. Baseline: 2.56 ft/sec without device Goal status: INITIAL  4.  Pt will ambulate 350' on flat, even surface with SBA with minimal festination of gait with initiation of steps. Baseline: moderate to severe festination with initiation during eval on 06-03-23 Goal status: INITIAL  5.  Improve DGA by at least 4 points to demo increased safety with ambulation. Baseline:  to be assessed Goal status: INITIAL  6.  Caregiver (husband) to assist pt with land HEP consisting of balance and strengthening exercises. Baseline:  Goal status: INITIAL  LONG TERM GOALS: Target date: 08-02-23  Pt will increase Berg balance test score by at least 8 points to demo improved balance & reduced fall risk.  Baseline:  Goal status: INITIAL  2.  Pt will amb. 500' without device with SBA on even and uneven surfaces with minimal gait deviations exhibited and no LOB.   Baseline:  Goal status: INITIAL  3.  Increase DGI score by at least 8 points to demo improved balance and safety with ambulation.  Baseline: TBA Goal status: INITIAL  4.  Perform floor to stand transfer with UE support on object with supervision.  Baseline:  Goal status: INITIAL  5.  Husband will report pt's ability to negotiate steps at their home with use of rails without Rt heel catching edge of step. Baseline:  Goal status: INITIAL  6.  Husband to assist pt in  performing updated HEP for land and aquatic based exercises for water walking, balance and strengthening.  Baseline:  Goal status: INITIAL  ASSESSMENT:  CLINICAL IMPRESSION: PT session focused on further assessment of gait and completion of Berg balance test which was initiated in initial eval but not completed.  Pt's Berg balance test score = 35/56, indicative of high fall risk and DGI score 13/24, also indicative of fall risk.  Pt's gait pattern noted to be improved in today's session compared to that demonstrated during initial eval on 06-03-23 with less festination of gait note upon initiation of ambulation.  Pt demonstrates ataxia in RLE with coordination exercise of touching targets.  Cont with POC.    OBJECTIVE IMPAIRMENTS: Abnormal gait, decreased activity tolerance, decreased balance, decreased cognition, decreased endurance, decreased strength, impaired tone, and impaired UE functional use.   ACTIVITY LIMITATIONS: carrying, lifting, bending, stairs, bathing, toileting, hygiene/grooming, and locomotion level  PARTICIPATION LIMITATIONS: meal prep, cleaning, laundry, driving, shopping, and community activity  PERSONAL FACTORS: Age, Behavior pattern, Past/current experiences, and 1-2 comorbidities: h/o previous CVA's and h/o vascular dementia are also affecting patient's functional outcome.   REHAB POTENTIAL: Good  CLINICAL DECISION MAKING: Evolving/moderate complexity  EVALUATION COMPLEXITY: Moderate  PLAN:  PT FREQUENCY: 2x/week  PT DURATION: 8 weeks + eval   PLANNED INTERVENTIONS: 97110-Therapeutic exercises, 97530- Therapeutic activity, [id number]- Neuromuscular re-education, [phone]- Self Care, 60454- Gait training, [phone]- Aquatic Therapy, and Patient/Family education  PLAN FOR NEXT SESSION: OT referral requested from Dr. Chari Como;  check HEP for any questions; continue gait and balance training   Monserrath Junio, Celeste Cola, PT 06/07/2023, 2:52 PM

## 2023-06-06 ENCOUNTER — Telehealth: Payer: Self-pay | Admitting: Physical Therapy

## 2023-06-06 DIAGNOSIS — I69351 Hemiplegia and hemiparesis following cerebral infarction affecting right dominant side: Secondary | ICD-10-CM

## 2023-06-06 NOTE — Telephone Encounter (Signed)
 Dr. Sharl Davies, Carilyn Woolston is currently receiving OP PT to address gait and balance impairments secondary to CVA. Due to her RUE deficits and dependencies with ADL's and self care, I feel she would benefit from a referral to OT.  Her husband is requesting this referral as well.  If you agree, would you please place an order for OT eval & treat in Epic and we will get her scheduled.  Thank you, Barb Bonito, PT

## 2023-06-07 ENCOUNTER — Telehealth: Payer: Self-pay | Admitting: Neurology

## 2023-06-07 ENCOUNTER — Encounter: Payer: Self-pay | Admitting: Physical Therapy

## 2023-06-07 NOTE — Progress Notes (Signed)
   06/04/23 0001  Berg Balance Test  Sit to Stand 4  Standing Unsupported 3  Sitting with Back Unsupported but Feet Supported on Floor or Stool 4  Stand to Sit 4  Transfers 4  Standing Unsupported with Eyes Closed 1  Standing Unsupported with Feet Together 2  From Standing, Reach Forward with Outstretched Arm 3  From Standing Position, Pick up Object from Floor 3  From Standing Position, Turn to Look Behind Over each Shoulder 2  Turn 360 Degrees 1  Standing Unsupported, Alternately Place Feet on Step/Stool 1  Standing Unsupported, One Foot in Front 2  Standing on One Leg 1  Total Score 35

## 2023-06-07 NOTE — Telephone Encounter (Signed)
 Attempted to call Pt husband. No answer, unable to LVM. Have routed med refill request to provider for review.

## 2023-06-07 NOTE — Telephone Encounter (Signed)
 Pt Husband call to request medaication . (Study - LIBREXIA-STROKE - milvexian 25 mg or placebo tablet (PI-Sethi) ) Pt husband  states they are out and wanted to know  if pt could have more refills

## 2023-06-07 NOTE — Telephone Encounter (Signed)
 Pt's husband called and was informed of the nurses reason of calling him. Husband verbalized appreciation.

## 2023-06-07 NOTE — Progress Notes (Signed)
   06/04/23 0001  Dynamic Gait Index  Level Surface 1  Change in Gait Speed 2  Gait with Horizontal Head Turns 2  Gait with Vertical Head Turns 1  Gait and Pivot Turn 2  Step Over Obstacle 1  Step Around Obstacles 2  Steps 2  Total Score 13

## 2023-06-10 ENCOUNTER — Ambulatory Visit: Payer: Self-pay | Admitting: Physical Therapy

## 2023-06-10 DIAGNOSIS — H2513 Age-related nuclear cataract, bilateral: Secondary | ICD-10-CM | POA: Diagnosis not present

## 2023-06-10 DIAGNOSIS — M6281 Muscle weakness (generalized): Secondary | ICD-10-CM | POA: Diagnosis not present

## 2023-06-10 DIAGNOSIS — R41840 Attention and concentration deficit: Secondary | ICD-10-CM | POA: Diagnosis not present

## 2023-06-10 DIAGNOSIS — R29818 Other symptoms and signs involving the nervous system: Secondary | ICD-10-CM | POA: Diagnosis not present

## 2023-06-10 DIAGNOSIS — R2689 Other abnormalities of gait and mobility: Secondary | ICD-10-CM

## 2023-06-10 DIAGNOSIS — R41844 Frontal lobe and executive function deficit: Secondary | ICD-10-CM | POA: Diagnosis not present

## 2023-06-10 DIAGNOSIS — R2681 Unsteadiness on feet: Secondary | ICD-10-CM

## 2023-06-10 DIAGNOSIS — I69351 Hemiplegia and hemiparesis following cerebral infarction affecting right dominant side: Secondary | ICD-10-CM | POA: Diagnosis not present

## 2023-06-10 DIAGNOSIS — R41842 Visuospatial deficit: Secondary | ICD-10-CM | POA: Diagnosis not present

## 2023-06-10 DIAGNOSIS — R278 Other lack of coordination: Secondary | ICD-10-CM | POA: Diagnosis not present

## 2023-06-10 NOTE — Patient Instructions (Signed)
   Aquatic Therapy: What to Expect!  Where:  [facility] at [facility] [address] [place], [name]  [address] [phone]  NOTE:  You will receive an automated phone message reminding you of your appointment and it will say the appointment is at the [facility] on [address]  We are working to fix this- just know that you will meet [name] at the pool!  How to Prepare: Please make sure you drink 8 ounces of water about one hour prior to your pool session A caregiver MUST attend the entire session with the patient.  The caregiver will be responsible for assisting with dressing as well as any toileting needs.  If the patient will be doing a home program this should likely be the person who will assist as well.  Patients must wear either their street shoes or pool shoes until they are ready to enter the pool with the therapist.  Patients must also wear either street shoes or pool shoes once exiting the pool to walk to the locker room.  This will helps [name] prevent slips and falls.  Please arrive 15 minutes early to prepare for your pool therapy session Sign in at the front desk on the clipboard marked for [place] You may use the locker rooms on your right and then enter directly into the recreation pool (NOT the competition pool) Please make sure to attend to any toileting needs prior to entering the pool Please be dressed in your swim suit and on the pool deck at least 5 minutes before your appointment Once on the pool deck your therapist will ask you to sign the Patient  Consent and Assignment of Benefits form Your therapist may take your blood pressure prior to, during and after your session if indicated  About the pool  and parking: Entering the pool Your therapist will assist you; there are 2 ways to enter:  stairs with railings or with a chair lift.   Your therapist will determine the most appropriate way for you. Water temperature is usually between 86-87 degrees There  may be other swimmers in the pool at the same time Parking is free.   Contact Info:     Appointments: [facility]  All sessions are 45 minutes   [address]  [address]     Please call the [facility] if   [place], [name]   [address]    you need to cancel or reschedule an appointment.  [phone]

## 2023-06-10 NOTE — Telephone Encounter (Signed)
 Spoke with Jacqueline Bonilla in research and he will be in touch with the patient.

## 2023-06-10 NOTE — Telephone Encounter (Signed)
 Per Jamil in research, he cld Pt and Pt stated they found 2 more bottles of medication.

## 2023-06-10 NOTE — Therapy (Unsigned)
 OUTPATIENT PHYSICAL THERAPY NEURO TREATMENT NOTE   Patient Name: Jacqueline Bonilla MRN: [id number] DOB:Aug 27, 1955, 68 y.o., female Today's Date: 06/12/2023   PCP: Elvira Hammersmith, MD REFERRING PROVIDER: Genetta Kenning, MD  END OF SESSION:  PT End of Session - 06/12/23 0819     Visit Number 3    Number of Visits 17   combination of land/aquatic visits   Date for PT Re-Evaluation 08/02/23   pushed out 1 week due to scheduling   Authorization Type [employer]    Authorization Time Period 06-03-23 - 08-03-23    Progress Note Due on Visit 10    PT Start Time 0847    PT Stop Time 0930    PT Time Calculation (min) 43 min    Equipment Utilized During Treatment Gait belt    Activity Tolerance Patient tolerated treatment well    Behavior During Therapy [facility] for tasks assessed/performed               Past Medical History:  Diagnosis Date   Anemia    Anxiety    Depression    Dysmenorrhea    Endometriosis    Fibroid    Hypertension    Implantable loop recorder present 2021   Microhematuria    negative workup   Osteoporosis    Tachycardia    Thrombocytopenia (HCC)    Past Surgical History:  Procedure Laterality Date   BREAST BIOPSY     BUBBLE STUDY  12/08/2020   Procedure: BUBBLE STUDY;  Surgeon: Loyde Rule, MD;  Location: [facility] ENDOSCOPY;  Service: Cardiovascular;;   CESAREAN SECTION     hysteroscopic resection     implantable loop recorder implant  10/21/2019   Med[employer] Reveal Linq model LNQ 22 ([place] [id number]) implantable loop recorder   IR CT HEAD LTD  12/01/2020   IR PERCUTANEOUS ART THROMBECTOMY/INFUSION INTRACRANIAL INC DIAG ANGIO  12/01/2020   IR US  GUIDE VASC ACCESS RIGHT  12/01/2020   ORIF HUMERUS FRACTURE Left 07/31/2021   Procedure: OPEN REDUCTION INTERNAL FIXATION (ORIF) DISTAL HUMERUS FRACTURE;  Surgeon: Laneta Pintos, MD;  Location: [facility] OR;  Service: Orthopedics;  Laterality: Left;   RADIOLOGY WITH ANESTHESIA N/A 12/01/2020   Procedure: IR  WITH ANESTHESIA - CODE STROKE;  Surgeon: Radiologist, Medication, MD;  Location: [facility] OR;  Service: Radiology;  Laterality: N/A;   TEE WITHOUT CARDIOVERSION N/A 12/08/2020   Procedure: TRANSESOPHAGEAL ECHOCARDIOGRAM (TEE);  Surgeon: Loyde Rule, MD;  Location: [facility] ENDOSCOPY;  Service: Cardiovascular;  Laterality: N/A;   Patient Active Problem List   Diagnosis Date Noted   Diarrhea 05/24/2023   Gait disturbance, 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   Hemiparesis affecting right side as late effect of cerebrovascular accident (CVA) (HCC) 04/23/2023   Apraxia, 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   Frequent falls 02/11/2023   Crushing injury of right hip 02/11/2023   Statin intolerance 04/24/2022   Internal hemorrhoids 01/10/2021   Dyslipidemia 12/29/2020   History of CVA (cerebrovascular accident) 07/14/2020   Normocytic anemia 07/11/2020   Vascular dementia without behavioral disturbance (HCC) 07/15/2019   History of COVID-19 04/22/2019   Essential hypertension 03/03/2019    ONSET DATE: 03-27-23 (Lt MCA CVA)  REFERRING DIAG: I69.398,R26.9 (ICD-10-CM) - Gait disturbance, post-stroke I69.351 (ICD-10-CM) - Hemiparesis affecting right side as late effect of cerebrovascular accident (CVA) (HCC)  THERAPY DIAG:  Other abnormalities of gait and mobility  Unsteadiness on feet  Muscle weakness (generalized)  Rationale  for Evaluation and Treatment: Rehabilitation  SUBJECTIVE:                                                                                                                                                                                             SUBJECTIVE STATEMENT: Pt reports she is doing fine - no new issues or problems.  Husband reports they went to the [employer] this past weekend and he had her do some exercises in the pool - says she did well Pt accompanied by: spouse, [name]  PERTINENT HISTORY:  Per chart  note 04-01-23 Jacqueline Bonilla is a 68 y.o. 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  She did have a loop recorder placed at that time and maintained on aspirin .  She was taken off Brilinta  in February 2025.  Patient received inpatient rehab services 12/08/2020 - 12/17/2020 and was discharged to home contact-guard assist for ambulation.  Per chart review she lives with spouse.  1 level home 5 steps to entry.  Presented 03/27/2023 with aphasia altered mental status and unstable gait with frequent falls.  CT/MRI showed small acute left MCA infarction.  Severe chronic small vessel ischemic disease and cerebral atrophy.  CTA showed no large vessel occlusion.  She did receive TNK.  Admission chemistries unremarkable urine drug screen positive marijuana.  Echocardiogram with ejection fraction of 60 to 65% no wall motion abnormalities grade 1 diastolic dysfunction.  Neurology follow-up maintained on aspirin  as well as Brilinta  90 mg twice daily x 4 weeks then aspirin  alone with study drug followed by neurology services.  Tolerating a regular consistency diet.  Therapy evaluations completed due to patient decreased functional mobility was admitted for a comprehensive rehab program.  Secondary diagnosis History of stroke History of seizure Hypertension Hyperlipidemia Substance use-marijuana Vascular dementia  h/o loop recorder implant Oct. 2021; h/o Lt humerus fracture July 2023    PAIN:  Are you having pain? Yes: NPRS scale: 2-3/10 Pain location: Rt wrist  Pain description: sore, discomfort Aggravating factors: moving it Relieving factors: at rest  - not moving it *will request order for OT from Dr. Sharl Davies - PT will not address wrist pain as out of scope of referral  PRECAUTIONS:  Other: No driving  RED FLAGS: None   WEIGHT BEARING RESTRICTIONS: No  FALLS: Has patient fallen in last 6 months? Yes. Number of falls 12+ - some due to dizzy spells - due to change in medication (unsure if this is what caused stroke, per stroke)  LIVING ENVIRONMENT: Lives with: lives with their spouse Lives in: House/apartment Stairs: Yes: External: 3 steps; can reach both Has following equipment at home: Single point cane and Grab bars  PLOF: Independent with basic ADLs, Independent with household mobility without device, Independent with community mobility without device, and Needs assistance with homemaking  PATIENT GOALS: improve walking and balance  OBJECTIVE:  Note: Objective measures were completed at Evaluation unless otherwise noted.  DIAGNOSTIC FINDINGS: IMPRESSION:MRI Brain without contrast 03-28-23 Small acute infarct in the posterior left frontal cortex. Some of the diffusion hyperintensity on yesterday's scan was reversible.   Advanced, widespread chronic ischemic injury with atrophy.    COGNITION: Overall cognitive status: Impaired and pt has vascular dementia per chart notes   SENSATION: WFL  COORDINATION: RLE WFL's; pt demonstrates festinating gait upon initial sit to stand with step initiation for gait  POSTURE: rounded shoulders and forward head  LOWER EXTREMITY ROM:   WFL's bil. LE's  LOWER EXTREMITY MMT:  LLE WNL's  MMT Right Eval Left Eval  Hip flexion 3+   Hip extension    Hip abduction    Hip adduction    Hip internal rotation    Hip external rotation    Knee flexion 4   Knee extension 5   Ankle dorsiflexion 4   Ankle plantarflexion    Ankle inversion    Ankle eversion    (Blank rows = not tested)  BED MOBILITY:  Findings: Independent  TRANSFERS: Sit to stand: Modified independence  Assistive device utilized: Single point cane     Pt able to perform sit to stand from mat without UE support  RAMP:  Not tested  CURB:  Not  tested  STAIRS: Findings: Level of Assistance: SBA, Stair Negotiation Technique: Step to Pattern with Bilateral Rails, Number of Stairs: 4, Height of Stairs: 6   , and Comments: husband reports pt does not clear heel of Rt foot with step descension at their home GAIT: Findings: Gait Characteristics: pt demonstrates festinating gait for initial steps then amb. with, step through pattern, decreased arm swing- Right, lateral lean- Right, and decreased trunk rotation, Distance walked: 100', Assistive device utilized:Single point cane, Level of assistance: CGA, and Comments: festinating gait for initial steps when initiating ambulation; pt carried SPC from lobby to clinic gym - did not use consistently   FUNCTIONAL TESTS:  5 times sit to stand: 14.79 secs without UE support Timed up and go (TUG): 15.75 secs without device 10 meter walk test: 12.82 secs without device = 2.56 ft/sec     [facility] PT Assessment - 06/03/23 0001                Berg Balance Test    Sit to Stand Able to stand without using hands and stabilize independently     Standing Unsupported Able to stand 2 minutes with supervision     Sitting with Back Unsupported but Feet Supported on Floor or Stool Able to sit safely and securely 2 minutes     Stand to Sit Sits safely with minimal use of hands     Transfers Able to transfer safely, minor use of hands     Standing Unsupported with Eyes Closed Unable to keep eyes closed 3 seconds but stays steady     Standing Unsupported with Feet Together Able to place feet together independently but unable to hold for 30 seconds                 Berg not finished  due to time constraint and pt's c/o fatigue - pt very labile after completing above Berg balance assessment                                                                                                                             TREATMENT DATE: 06-10-23   Pt continues to carry [facility] when ambulating into clinic - does not use cane  correctly for assist with ambulation  Gait:  Pt gait trained 2 laps (115' x 2 reps) without device with cues to relax RUE for increased arm swing during gait and to increase Rt step length Ramp negotiation - with no device - with CGA Curb negotiation - no device - with CGA - pt ascended curb with LLE leading Step training - pt negotiated 4 steps with use of bil. Hand rails - step over step sequence with ascension, step by step sequence with descension  NeuroRe-ed:  SLS and coordination exercises for RLE:  Tap ups to 1st step 10 reps each foot with CGA without UE support on handrails Tap ups to 2nd step 10 reps each foot with min. UE support on handrails with CGA Marching in place 10 reps with CGA to min assist - no UE support used  Pt performed touching 3 balance bubbles - 5 reps each foot - with min HHA from PT - varied sequence of touching each target to facilitate balance with LE crossing midline Progressed to tapping 2 cones - straight ahead 5 reps with min to mod assist for balance recovery   TherAct: Sit to stand transfers from mat - without UE support - cues to bring feet closer together as pt has tendency to place feet apart for WBOS Step ups onto 6 step with RLE leading for strengthening - 10 reps with UE support prn for assist with balance - CGA for safety Red mat placed on floor - pt transferred from sitting position to stand to tall kneeling on floor to sitting on floor with verbal and tactile cues as pt has problems with motor planning; pt transitioned to tall kneeling - performed 10 small range squats for strengthening Head turns horizontal 5 reps and then vertical 5 reps with CGA Performed Rt 1/2 kneeling position with mod assist initially to maintain balance; Lt 1/2 kneeling with min assist to maintain balance Performed floor to stand transfer with min assist with verbal cues for sequence with this transfer   Standing by mat - tossing and catching ball for improved standing  balance and eye/hand coordination Pt amb. Approx. 20' x 2 reps tossing and catching ball with min to mod assist for balance for improved dynamic balance and multi-tasking with gait  HEP: Balance HEP Access Code: [email or web address] URL: [email or web address] Date: 06/07/2023 Prepared by: Johnnette Nakayama  Exercises - Standing Hip Flexion with Counter Support  - 1 x daily - 7 x weekly - 1 sets - 10 reps - Standing Hip Abduction with Counter Support  - 1  x daily - 7 x weekly - 1 sets - 10 reps - Standing March with Counter Support  - 1 x daily - 7 x weekly - 1 sets - 10 reps - Standing Hip Extension with Counter Support  - 1 x daily - 7 x weekly - 1 sets - 10 reps  Also - added touching 2 targets (paper plate or solo cup) in standing with assist of husband or standing at counter; 5 reps each leg - 1x/day       PATIENT EDUCATION: Education details: reviewed balance HEP issued as pt is currently not performing these exercises as part of her routine per husband's report - is doing more exercises in seated position-- EMPHASIZED NEED TO DO THE STANDING BALANCE HEP AS ISSUED ON 06-07-23 Person educated: Patient and Spouse Education method: Medical illustrator Education comprehension: verbalized understanding  HOME EXERCISE PROGRAM: To be established   GOALS: Goals reviewed with patient? Yes  SHORT TERM GOALS: Target date: 06-28-23  Pt will increase Berg balance test score by at least 4 points to demo improved balance & reduced fall risk.  Baseline: Goal status: INITIAL  2.  Improve TUG score to </= 13.5 secs without device for reduced fall risk. Baseline: 15.75 secs without device Goal status: INITIAL  3.  Increase gait velocity to >/= 2.9 ft/sec without device for increased gait efficiency. Baseline: 2.56 ft/sec without device Goal status: INITIAL  4.  Pt will ambulate 350' on flat, even surface with SBA with minimal festination of gait with initiation of  steps. Baseline: moderate to severe festination with initiation during eval on 06-03-23 Goal status: INITIAL  5.  Improve DGA by at least 4 points to demo increased safety with ambulation. Baseline:  to be assessed Goal status: INITIAL  6.  Caregiver (husband) to assist pt with land HEP consisting of balance and strengthening exercises. Baseline:  Goal status: INITIAL  LONG TERM GOALS: Target date: 08-02-23  Pt will increase Berg balance test score by at least 8 points to demo improved balance & reduced fall risk.  Baseline:  Goal status: INITIAL  2.  Pt will amb. 500' without device with SBA on even and uneven surfaces with minimal gait deviations exhibited and no LOB.   Baseline:  Goal status: INITIAL  3.  Increase DGI score by at least 8 points to demo improved balance and safety with ambulation.  Baseline: TBA Goal status: INITIAL  4.  Perform floor to stand transfer with UE support on object with supervision.  Baseline:  Goal status: INITIAL  5.  Husband will report pt's ability to negotiate steps at their home with use of rails without Rt heel catching edge of step. Baseline:  Goal status: INITIAL  6.  Husband to assist pt in performing updated HEP for land and aquatic based exercises for water walking, balance and strengthening.  Baseline:  Goal status: INITIAL  ASSESSMENT:  CLINICAL IMPRESSION: PT session focused on balance training to improve RLE SLS and coordination of RLE, strengthening and floor to stand transfer.  Pt's gait continues to improve slowly with less festination noted at initiation of gait upon initial sit to stand.  Pt continues to hold RUE adducted and flexed at elbow during gait - needs cues to relax Rt arm to facilitate arm swing.  Pt needed cues for correct sequence with floor to stand transfer.  Pt had moderate difficulty with performing ambulation with tossing and catching ball.  Pt reported moderate fatigue at end of session.  Cont with POC.     OBJECTIVE IMPAIRMENTS: Abnormal gait, decreased activity tolerance, decreased balance, decreased cognition, decreased endurance, decreased strength, impaired tone, and impaired UE functional use.   ACTIVITY LIMITATIONS: carrying, lifting, bending, stairs, bathing, toileting, hygiene/grooming, and locomotion level  PARTICIPATION LIMITATIONS: meal prep, cleaning, laundry, driving, shopping, and community activity  PERSONAL FACTORS: Age, Behavior pattern, Past/current experiences, and 1-2 comorbidities: h/o previous CVA's and h/o vascular dementia are also affecting patient's functional outcome.   REHAB POTENTIAL: Good  CLINICAL DECISION MAKING: Evolving/moderate complexity  EVALUATION COMPLEXITY: Moderate  PLAN:  PT FREQUENCY: 2x/week  PT DURATION: 8 weeks + eval   PLANNED INTERVENTIONS: 97110-Therapeutic exercises, 97530- Therapeutic activity, [id number]- Neuromuscular re-education, [phone]- Self Care, [id number]- Gait training, [phone]- Aquatic Therapy, and Patient/Family education  PLAN FOR NEXT SESSION: gait training without device - pt carries SPC but does not use correctly; balance training - anything to improve SLS and coordination RLE   Dorsie Gaunt, PT 06/12/2023, 10:58 AM

## 2023-06-10 NOTE — Telephone Encounter (Signed)
 This would go through the research department.

## 2023-06-12 ENCOUNTER — Ambulatory Visit: Admitting: Physical Therapy

## 2023-06-12 ENCOUNTER — Encounter: Payer: Self-pay | Admitting: Physical Therapy

## 2023-06-12 DIAGNOSIS — R29818 Other symptoms and signs involving the nervous system: Secondary | ICD-10-CM | POA: Diagnosis not present

## 2023-06-12 DIAGNOSIS — R278 Other lack of coordination: Secondary | ICD-10-CM | POA: Diagnosis not present

## 2023-06-12 DIAGNOSIS — R41844 Frontal lobe and executive function deficit: Secondary | ICD-10-CM | POA: Diagnosis not present

## 2023-06-12 DIAGNOSIS — M6281 Muscle weakness (generalized): Secondary | ICD-10-CM | POA: Diagnosis not present

## 2023-06-12 DIAGNOSIS — R2689 Other abnormalities of gait and mobility: Secondary | ICD-10-CM | POA: Diagnosis not present

## 2023-06-12 DIAGNOSIS — R41842 Visuospatial deficit: Secondary | ICD-10-CM | POA: Diagnosis not present

## 2023-06-12 DIAGNOSIS — I69351 Hemiplegia and hemiparesis following cerebral infarction affecting right dominant side: Secondary | ICD-10-CM | POA: Diagnosis not present

## 2023-06-12 DIAGNOSIS — R41840 Attention and concentration deficit: Secondary | ICD-10-CM | POA: Diagnosis not present

## 2023-06-12 DIAGNOSIS — R2681 Unsteadiness on feet: Secondary | ICD-10-CM

## 2023-06-14 ENCOUNTER — Encounter: Payer: Self-pay | Admitting: Physical Therapy

## 2023-06-14 NOTE — Therapy (Signed)
 OUTPATIENT PHYSICAL THERAPY NEURO/AQUATIC TREATMENT NOTE   Patient Name: Jacqueline Bonilla MRN: [id number] DOB:[birth year], 68 y.o., female Today's Date: 06/14/2023   PCP: Jacqueline Hammersmith, Bonilla REFERRING PROVIDER: Genetta Kenning, Bonilla  END OF SESSION:  PT End of Session - 06/14/23 1331     Visit Number 4    Number of Visits 17   combination of land/aquatic visits   Date for PT Re-Evaluation 08/02/23   pushed out 1 week due to scheduling   Authorization Type [employer]    Authorization Time Period 06-03-23 - 08-03-23    Authorization - Visit Number 4    Authorization - Number of Visits 17    Progress Note Due on Visit 10    PT Start Time 1450    PT Stop Time 1530    PT Time Calculation (min) 40 min    Equipment Utilized During Treatment --   large noodle, large barbell   Activity Tolerance Patient tolerated treatment well    Behavior During Therapy WFL for tasks assessed/performed            Past Medical History:  Diagnosis Date   Anemia    Anxiety    Depression    Dysmenorrhea    Endometriosis    Fibroid    Hypertension    Implantable loop recorder present 2021   Microhematuria    negative workup   Osteoporosis    Tachycardia    Thrombocytopenia (HCC)    Past Surgical History:  Procedure Laterality Date   BREAST BIOPSY     BUBBLE STUDY  12/08/2020   Procedure: BUBBLE STUDY;  Surgeon: Jacqueline Rule, Bonilla;  Location: [facility] ENDOSCOPY;  Service: Cardiovascular;;   CESAREAN SECTION     hysteroscopic resection     implantable loop recorder implant  10/21/2019   [employer] Reveal Linq model LNQ 22 (Jacqueline Bonilla) implantable loop recorder   IR CT HEAD LTD  12/01/2020   IR PERCUTANEOUS ART THROMBECTOMY/INFUSION INTRACRANIAL INC DIAG ANGIO  12/01/2020   IR US  GUIDE VASC ACCESS RIGHT  12/01/2020   ORIF HUMERUS FRACTURE Left 07/31/2021   Procedure: OPEN REDUCTION INTERNAL FIXATION (ORIF) DISTAL HUMERUS FRACTURE;  Surgeon: Jacqueline Pintos, Bonilla;  Location: [facility] OR;   Service: Orthopedics;  Laterality: Left;   RADIOLOGY WITH ANESTHESIA N/A 12/01/2020   Procedure: IR WITH ANESTHESIA - CODE STROKE;  Surgeon: Jacqueline Bonilla;  Location: [facility] OR;  Service: Radiology;  Laterality: N/A;   TEE WITHOUT CARDIOVERSION N/A 12/08/2020   Procedure: TRANSESOPHAGEAL ECHOCARDIOGRAM (TEE);  Surgeon: Jacqueline Rule, Bonilla;  Location: [facility] ENDOSCOPY;  Service: Cardiovascular;  Laterality: N/A;   Patient Active Problem List   Diagnosis Date Noted   Diarrhea 05/24/2023   Gait disturbance, 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   Hemiparesis affecting right side as late effect of cerebrovascular accident (CVA) (HCC) 04/23/2023   Apraxia, 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   Frequent falls 02/11/2023   Crushing injury of right hip 02/11/2023   Statin intolerance 04/24/2022   Internal hemorrhoids 01/10/2021   Dyslipidemia 12/29/2020   History of CVA (cerebrovascular accident) 07/14/2020   Normocytic anemia 07/11/2020   Vascular dementia without behavioral disturbance (HCC) 07/15/2019   History of COVID-19 04/22/2019   Essential hypertension 03/03/2019    ONSET DATE: 03-27-23 (Lt MCA CVA)  REFERRING DIAG: I69.398,R26.9 (ICD-10-CM) - Gait disturbance, post-stroke I69.351 (ICD-10-CM) - Hemiparesis affecting right side as late effect of cerebrovascular accident (CVA) (HCC)  THERAPY  DIAG:  Other abnormalities of gait and mobility  Unsteadiness on feet  Muscle weakness (generalized)  Other lack of coordination  Rationale for Evaluation and Treatment: Rehabilitation  SUBJECTIVE:                                                                                                                                                                                             SUBJECTIVE STATEMENT: Pt presents for initial aquatic therapy session at [facility] - accompanied by her husband, Jacqueline Bonilla: pt  amb. To pool area with use of SPC with supervision of spouse  Pt accompanied by: spouse, Jacqueline Bonilla  PERTINENT HISTORY:  Per chart note 04-01-23 Jacqueline Bonilla is a 68 y.o. 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  She did have a loop recorder placed at that time and maintained on aspirin .  She was taken off Brilinta  in February 2025.  Patient received inpatient rehab services 12/08/2020 - 12/17/2020 and was discharged to home contact-guard assist for ambulation.  Per chart review she lives with spouse.  1 level home 5 steps to entry.  Presented 03/27/2023 with aphasia altered mental status and unstable gait with frequent falls.  CT/MRI showed small acute left MCA infarction.  Severe chronic small vessel ischemic disease and cerebral atrophy.  CTA showed no large vessel occlusion.  She did receive TNK.  Admission chemistries unremarkable urine drug screen positive marijuana.  Echocardiogram with ejection fraction of 60 to 65% no wall motion abnormalities grade 1 diastolic dysfunction.  Neurology follow-up maintained on aspirin  as well as Brilinta  90 mg twice daily x 4 weeks then aspirin  alone with study drug followed by neurology services.  Tolerating a regular consistency diet.  Therapy evaluations completed due to patient decreased functional mobility was admitted for a comprehensive rehab program.  Secondary diagnosis History of stroke History of seizure Hypertension Hyperlipidemia Substance use-marijuana Vascular dementia  h/o loop recorder implant Oct. 2021; h/o Lt humerus fracture July 2023    PAIN:  Are you having pain? Yes: NPRS scale: 2-3/10 Pain location: Rt wrist  Pain description: sore, discomfort Aggravating factors: moving it Relieving factors: at rest  - not moving  it *will request order for OT from Dr. Sharl Bonilla - PT will not address wrist pain as out of scope of referral  PRECAUTIONS: Other: No driving  RED FLAGS: None   WEIGHT BEARING RESTRICTIONS: No  FALLS: Has patient fallen in last 6 months? Yes. Number of falls 12+ - some due to dizzy  spells - due to change in medication (unsure if this is what caused stroke, per stroke)  LIVING ENVIRONMENT: Lives with: lives with their spouse Lives in: House/apartment Stairs: Yes: External: 3 steps; can reach both Has following equipment at home: Single point cane and Grab bars  PLOF: Independent with basic ADLs, Independent with household mobility without device, Independent with community mobility without device, and Needs assistance with homemaking  PATIENT GOALS: improve walking and balance  OBJECTIVE:  Note: Objective measures were completed at Evaluation unless otherwise noted.  DIAGNOSTIC FINDINGS: IMPRESSION:MRI Brain without contrast 03-28-23 Small acute infarct in the posterior left frontal cortex. Some of the diffusion hyperintensity on yesterday's scan was reversible.   Advanced, widespread chronic ischemic injury with atrophy.    COGNITION: Overall cognitive status: Impaired and pt has vascular dementia per chart notes   SENSATION: WFL  COORDINATION: RLE WFL's; pt demonstrates festinating gait upon initial sit to stand with step initiation for gait  POSTURE: rounded shoulders and forward head  LOWER EXTREMITY ROM:   WFL's bil. LE's  LOWER EXTREMITY MMT:  LLE WNL's  MMT Right Eval Left Eval  Hip flexion 3+   Hip extension    Hip abduction    Hip adduction    Hip internal rotation    Hip external rotation    Knee flexion 4   Knee extension 5   Ankle dorsiflexion 4   Ankle plantarflexion    Ankle inversion    Ankle eversion    (Blank rows = not tested)  BED MOBILITY:  Findings: Independent  TRANSFERS: Sit to stand: Modified independence  Assistive device  utilized: Single point cane     Pt able to perform sit to stand from mat without UE support  RAMP:  Not tested  CURB:  Not tested  STAIRS: Findings: Level of Assistance: SBA, Stair Negotiation Technique: Step to Pattern with Bilateral Rails, Number of Stairs: 4, Height of Stairs: 6   , and Comments: husband reports pt does not clear heel of Rt foot with step descension at their home GAIT: Findings: Gait Characteristics: pt demonstrates festinating gait for initial steps then amb. with, step through pattern, decreased arm swing- Right, lateral lean- Right, and decreased trunk rotation, Distance walked: 100', Assistive device utilized:Single point cane, Level of assistance: CGA, and Comments: festinating gait for initial steps when initiating ambulation; pt carried SPC from lobby to clinic gym - did not use consistently   FUNCTIONAL TESTS:  5 times sit to stand: 14.79 secs without UE support Timed up and go (TUG): 15.75 secs without device 10 meter walk test: 12.82 secs without device = 2.56 ft/sec     [facility] PT Assessment - 06/03/23 0001                Berg Balance Test    Sit to Stand Able to stand without using hands and stabilize independently     Standing Unsupported Able to stand 2 minutes with supervision     Sitting with Back Unsupported but Feet Supported on Floor or Stool Able to sit safely and securely 2 minutes     Stand to Sit Sits safely with minimal use of hands     Transfers Able to transfer safely, minor use of hands     Standing Unsupported with Eyes Closed Unable to keep eyes closed 3 seconds but stays steady     Standing Unsupported with Feet Together Able to place feet together independently but unable to hold for 30 seconds  Berg not finished due to time constraint and pt's c/o fatigue - pt very labile after completing above Berg balance assessment                                                                                                                              TREATMENT DATE: 06-12-23 Aquatic PT at [facility] - pool temp. 90 degrees  Patient seen for aquatic therapy today.  Treatment took place in water 3.6-4.0 feet deep depending upon activity.  Pt entered and exited the pool via step negotiation with use of bil. Hand rails with CGA.  Warm up; forwards ambulation with pt holding large bar bell stabilized by PT 18' x 6 reps; sideways amb. 18' x 4 reps, backwards amb. 18' x 2 reps  Marching in place 10 reps each leg x 2 sets  Alternate stepping forward/back, out/in, and back/forward 5 reps each direction each leg holding onto edge of pool and HHA from PT (LUE)  Marching forwards across pool 18' x 2 reps holding large barbell  Standing balance exercises - alternating forward kicks, side kicks, backward kicks 10 reps each direction each leg  Crossovers 18' x 2 reps with bil. UE support on PT's forearms; stepping behind 18' x 2 reps with bil. UE support on PT's forearms   Plyometrics - 1/2 jumping jacks (legs only) 10 reps - holding small bar bells Lunges attempted - pt had difficulty with the movement - approx. 10 reps  Pt requires buoyancy of water for support for reduced fall risk with standing balance exercises and with SLS activities; pt able to tolerate increased standing and ambulation in water compared to that on land; viscosity of water is needed for resistance for strengthening and current of water provides perturbations for challenge for balance training          HEP: Balance HEP Access Code: [email or web address] URL: [email or web address] Date: 06/07/2023 Prepared by: Johnnette Nakayama  Exercises - Standing Hip Flexion with Counter Support  - 1 x daily - 7 x weekly - 1 sets - 10 reps - Standing Hip Abduction with Counter Support  - 1 x daily - 7 x weekly - 1 sets - 10 reps - Standing March with Counter Support  - 1 x daily - 7 x weekly - 1 sets - 10 reps - Standing Hip Extension with Counter  Support  - 1 x daily - 7 x weekly - 1 sets - 10 reps  Also - added touching 2 targets (paper plate or solo cup) in standing with assist of husband or standing at counter; 5 reps each leg - 1x/day       PATIENT EDUCATION: Education details: reviewed balance HEP issued as pt is currently not performing these exercises as part of her routine per husband's report - is doing more exercises in seated position-- EMPHASIZED NEED TO DO THE STANDING BALANCE HEP AS ISSUED ON 06-07-23 Person educated: Patient and Spouse Education method: Explanation  and Demonstration Education comprehension: verbalized understanding  HOME EXERCISE PROGRAM: To be established   GOALS: Goals reviewed with patient? Yes  SHORT TERM GOALS: Target date: 06-28-23  Pt will increase Berg balance test score by at least 4 points to demo improved balance & reduced fall risk.  Baseline: Goal status: INITIAL  2.  Improve TUG score to </= 13.5 secs without device for reduced fall risk. Baseline: 15.75 secs without device Goal status: INITIAL  3.  Increase gait velocity to >/= 2.9 ft/sec without device for increased gait efficiency. Baseline: 2.56 ft/sec without device Goal status: INITIAL  4.  Pt will ambulate 350' on flat, even surface with SBA with minimal festination of gait with initiation of steps. Baseline: moderate to severe festination with initiation during eval on 06-03-23 Goal status: INITIAL  5.  Improve DGA by at least 4 points to demo increased safety with ambulation. Baseline:  to be assessed Goal status: INITIAL  6.  Caregiver (husband) to assist pt with land HEP consisting of balance and strengthening exercises. Baseline:  Goal status: INITIAL  LONG TERM GOALS: Target date: 08-02-23  Pt will increase Berg balance test score by at least 8 points to demo improved balance & reduced fall risk.  Baseline:  Goal status: INITIAL  2.  Pt will amb. 500' without device with SBA on even and uneven surfaces  with minimal gait deviations exhibited and no LOB.   Baseline:  Goal status: INITIAL  3.  Increase DGI score by at least 8 points to demo improved balance and safety with ambulation.  Baseline: TBA Goal status: INITIAL  4.  Perform floor to stand transfer with UE support on object with supervision.  Baseline:  Goal status: INITIAL  5.  Husband will report pt's ability to negotiate steps at their home with use of rails without Rt heel catching edge of step. Baseline:  Goal status: INITIAL  6.  Husband to assist pt in performing updated HEP for land and aquatic based exercises for water walking, balance and strengthening.  Baseline:  Goal status: INITIAL  ASSESSMENT:  CLINICAL IMPRESSION: Aquatic PT session focused on water walking in various directions for improved balance with ambulation and on coordination exercises primarily for RLE.  Pt noted to have Rt lateral trunk flexion which seemed to increase with fatigue.  Pt reported fatigue during session due to having gone to [facility] prior to aquatic PT appt and did a workout with her trainer.  Pt noted to have increased Rt lateral trunk flexion with sidestepping to Rt and with crossovers to improve coordination and control of RLE.  Cont with POC.    OBJECTIVE IMPAIRMENTS: Abnormal gait, decreased activity tolerance, decreased balance, decreased cognition, decreased endurance, decreased strength, impaired tone, and impaired UE functional use.   ACTIVITY LIMITATIONS: carrying, lifting, bending, stairs, bathing, toileting, hygiene/grooming, and locomotion level  PARTICIPATION LIMITATIONS: meal prep, cleaning, laundry, driving, shopping, and community activity  PERSONAL FACTORS: Age, Behavior pattern, Past/current experiences, and 1-2 comorbidities: h/o previous CVA's and h/o vascular dementia are also affecting patient's functional outcome.   REHAB POTENTIAL: Good  CLINICAL DECISION MAKING: Evolving/moderate complexity  EVALUATION  COMPLEXITY: Moderate  PLAN:  PT FREQUENCY: 2x/week  PT DURATION: 8 weeks + eval   PLANNED INTERVENTIONS: 97110-Therapeutic exercises, 97530- Therapeutic activity, [id number]- Neuromuscular re-education, [phone]- Self Care, 24401- Gait training, [phone]- Aquatic Therapy, and Patient/Family education  PLAN FOR NEXT SESSION: LAND:  gait training without device - pt carries SPC but does not consistently use correctly; balance training -  anything to improve SLS and coordination RLE- blaze pods, targets, etc.; core stabilization - (pt noted to have Rt lateral trunk flexion/lean)   Aquatics; water walking, balance exercises with gradual decrease in UE support  Lashay Osborne, Celeste Cola, PT 06/14/2023, 1:36 PM

## 2023-06-17 ENCOUNTER — Encounter: Payer: Self-pay | Admitting: Physical Therapy

## 2023-06-17 ENCOUNTER — Ambulatory Visit: Payer: Self-pay | Admitting: Physical Therapy

## 2023-06-17 DIAGNOSIS — R2681 Unsteadiness on feet: Secondary | ICD-10-CM | POA: Diagnosis not present

## 2023-06-17 DIAGNOSIS — M6281 Muscle weakness (generalized): Secondary | ICD-10-CM

## 2023-06-17 DIAGNOSIS — I69351 Hemiplegia and hemiparesis following cerebral infarction affecting right dominant side: Secondary | ICD-10-CM | POA: Diagnosis not present

## 2023-06-17 DIAGNOSIS — R41840 Attention and concentration deficit: Secondary | ICD-10-CM | POA: Diagnosis not present

## 2023-06-17 DIAGNOSIS — R29818 Other symptoms and signs involving the nervous system: Secondary | ICD-10-CM | POA: Diagnosis not present

## 2023-06-17 DIAGNOSIS — R2689 Other abnormalities of gait and mobility: Secondary | ICD-10-CM

## 2023-06-17 DIAGNOSIS — R278 Other lack of coordination: Secondary | ICD-10-CM | POA: Diagnosis not present

## 2023-06-17 DIAGNOSIS — R41842 Visuospatial deficit: Secondary | ICD-10-CM | POA: Diagnosis not present

## 2023-06-17 DIAGNOSIS — R41844 Frontal lobe and executive function deficit: Secondary | ICD-10-CM | POA: Diagnosis not present

## 2023-06-17 NOTE — Therapy (Signed)
 OUTPATIENT PHYSICAL THERAPY NEURO TREATMENT   Patient Name: Jacqueline Bonilla MRN: [id number] DOB:01-Sep-1955, 68 y.o., female Today's Date: 06/17/2023   PCP: Jacqueline Hammersmith, MD REFERRING PROVIDER: Genetta Kenning, MD  END OF SESSION:  PT End of Session - 06/17/23 1027     Visit Number 5    Number of Visits 17   combination of land/aquatic visits   Date for PT Re-Evaluation 08/02/23   pushed out 1 week due to scheduling   Authorization Type [employer]    Authorization Time Period 06-03-23 - 08-03-23    Authorization - Visit Number 5    Authorization - Number of Visits 17    Progress Note Due on Visit 10    PT Start Time 1020    PT Stop Time 1104    PT Time Calculation (min) 44 min    Equipment Utilized During Treatment Gait belt   large noodle, large barbell   Activity Tolerance Patient tolerated treatment well    Behavior During Therapy WFL for tasks assessed/performed            Past Medical History:  Diagnosis Date   Anemia    Anxiety    Depression    Dysmenorrhea    Endometriosis    Fibroid    Hypertension    Implantable loop recorder present 2021   Microhematuria    negative workup   Osteoporosis    Tachycardia    Thrombocytopenia (HCC)    Past Surgical History:  Procedure Laterality Date   BREAST BIOPSY     BUBBLE STUDY  12/08/2020   Procedure: BUBBLE STUDY;  Surgeon: Loyde Rule, MD;  Location: [facility] ENDOSCOPY;  Service: Cardiovascular;;   CESAREAN SECTION     hysteroscopic resection     implantable loop recorder implant  10/21/2019   Medtronic Reveal Linq model LNQ 22 ([place] [id number]) implantable loop recorder   IR CT HEAD LTD  12/01/2020   IR PERCUTANEOUS ART THROMBECTOMY/INFUSION INTRACRANIAL INC DIAG ANGIO  12/01/2020   IR US  GUIDE VASC ACCESS RIGHT  12/01/2020   ORIF HUMERUS FRACTURE Left 07/31/2021   Procedure: OPEN REDUCTION INTERNAL FIXATION (ORIF) DISTAL HUMERUS FRACTURE;  Surgeon: Laneta Pintos, MD;  Location: [facility] OR;  Service:  Orthopedics;  Laterality: Left;   RADIOLOGY WITH ANESTHESIA N/A 12/01/2020   Procedure: IR WITH ANESTHESIA - CODE STROKE;  Surgeon: Radiologist, Medication, MD;  Location: [facility] OR;  Service: Radiology;  Laterality: N/A;   TEE WITHOUT CARDIOVERSION N/A 12/08/2020   Procedure: TRANSESOPHAGEAL ECHOCARDIOGRAM (TEE);  Surgeon: Loyde Rule, MD;  Location: [facility] ENDOSCOPY;  Service: Cardiovascular;  Laterality: N/A;   Patient Active Problem List   Diagnosis Date Noted   Diarrhea 05/24/2023   Gait disturbance, 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   Hemiparesis affecting right side as late effect of cerebrovascular accident (CVA) (HCC) 04/23/2023   Apraxia, 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   Frequent falls 02/11/2023   Crushing injury of right hip 02/11/2023   Statin intolerance 04/24/2022   Internal hemorrhoids 01/10/2021   Dyslipidemia 12/29/2020   History of CVA (cerebrovascular accident) 07/14/2020   Normocytic anemia 07/11/2020   Vascular dementia without behavioral disturbance (HCC) 07/15/2019   History of COVID-19 04/22/2019   Essential hypertension 03/03/2019    ONSET DATE: 03-27-23 (Lt MCA CVA)  REFERRING DIAG: I69.398,R26.9 (ICD-10-CM) - Gait disturbance, post-stroke I69.351 (ICD-10-CM) - Hemiparesis affecting right side as late effect of cerebrovascular accident (CVA) (HCC)  THERAPY  DIAG:  Other abnormalities of gait and mobility  Unsteadiness on feet  Muscle weakness (generalized)  Other lack of coordination  Hemiplegia and hemiparesis following cerebral infarction affecting right dominant side (HCC)  Rationale for Evaluation and Treatment: Rehabilitation  SUBJECTIVE:                                                                                                                                                                                             SUBJECTIVE STATEMENT: Pt presents for land therapy w/  new therapist this POC.  She is with her husband.  She carries her SPC without using and ambulates at fast pace.  She denies recent falls or acute changes. Pt accompanied by: spouse, [name]  PERTINENT HISTORY:  Per chart note 04-01-23 Jacqueline Bonilla is a 68 y.o. 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  She did have a loop recorder placed at that time and maintained on aspirin .  She was taken off Brilinta  in February 2025.  Patient received inpatient rehab services 12/08/2020 - 12/17/2020 and was discharged to home contact-guard assist for ambulation.  Per chart review she lives with spouse.  1 level home 5 steps to entry.  Presented 03/27/2023 with aphasia altered mental status and unstable gait with frequent falls.  CT/MRI showed small acute left MCA infarction.  Severe chronic small vessel ischemic disease and cerebral atrophy.  CTA showed no large vessel occlusion.  She did receive TNK.  Admission chemistries unremarkable urine drug screen positive marijuana.  Echocardiogram with ejection fraction of 60 to 65% no wall motion abnormalities grade 1 diastolic dysfunction.  Neurology follow-up maintained on aspirin  as well as Brilinta  90 mg twice daily x 4 weeks then aspirin  alone with study drug followed by neurology services.  Tolerating a regular consistency diet.  Therapy evaluations completed due to patient decreased functional mobility was admitted for a comprehensive rehab program.  Secondary diagnosis History of stroke History of seizure Hypertension Hyperlipidemia Substance use-marijuana Vascular dementia  h/o loop recorder implant Oct. 2021; h/o Lt humerus fracture July 2023    PAIN:  Are you having pain? Yes: NPRS scale: 3/10 Pain location: Rt wrist and fingers Pain  description: sharp Aggravating factors: moving it Relieving factors: at rest  - not moving it *will request order for OT from Dr. Sharl Davies - PT will not address wrist pain as out of scope of referral  PRECAUTIONS: Other: No driving  RED FLAGS: None   WEIGHT BEARING RESTRICTIONS: No  FALLS:  Has patient fallen in last 6 months? Yes. Number of falls 12+ - some due to dizzy spells - due to change in medication (unsure if this is what caused stroke, per stroke)  LIVING ENVIRONMENT: Lives with: lives with their spouse Lives in: House/apartment Stairs: Yes: External: 3 steps; can reach both Has following equipment at home: Single point cane and Grab bars  PLOF: Independent with basic ADLs, Independent with household mobility without device, Independent with community mobility without device, and Needs assistance with homemaking  PATIENT GOALS: improve walking and balance  OBJECTIVE:  Note: Objective measures were completed at Evaluation unless otherwise noted.  DIAGNOSTIC FINDINGS: IMPRESSION:MRI Brain without contrast 03-28-23 Small acute infarct in the posterior left frontal cortex. Some of the diffusion hyperintensity on yesterday's scan was reversible.   Advanced, widespread chronic ischemic injury with atrophy.    COGNITION: Overall cognitive status: Impaired and pt has vascular dementia per chart notes   SENSATION: WFL  COORDINATION: RLE WFL's; pt demonstrates festinating gait upon initial sit to stand with step initiation for gait  POSTURE: rounded shoulders and forward head  LOWER EXTREMITY ROM:   WFL's bil. LE's  LOWER EXTREMITY MMT:  LLE WNL's  MMT Right Eval Left Eval  Hip flexion 3+   Hip extension    Hip abduction    Hip adduction    Hip internal rotation    Hip external rotation    Knee flexion 4   Knee extension 5   Ankle dorsiflexion 4   Ankle plantarflexion    Ankle inversion    Ankle eversion    (Blank rows = not tested)  BED MOBILITY:   Findings: Independent  TRANSFERS: Sit to stand: Modified independence  Assistive device utilized: Single point cane     Pt able to perform sit to stand from mat without UE support  RAMP:  Not tested  CURB:  Not tested  STAIRS: Findings: Level of Assistance: SBA, Stair Negotiation Technique: Step to Pattern with Bilateral Rails, Number of Stairs: 4, Height of Stairs: 6   , and Comments: husband reports pt does not clear heel of Rt foot with step descension at their home GAIT: Findings: Gait Characteristics: pt demonstrates festinating gait for initial steps then amb. with, step through pattern, decreased arm swing- Right, lateral lean- Right, and decreased trunk rotation, Distance walked: 100', Assistive device utilized:Single point cane, Level of assistance: CGA, and Comments: festinating gait for initial steps when initiating ambulation; pt carried SPC from lobby to clinic gym - did not use consistently   FUNCTIONAL TESTS:  5 times sit to stand: 14.79 secs without UE support Timed up and go (TUG): 15.75 secs without device 10 meter walk test: 12.82 secs without device = 2.56 ft/sec     [facility] PT Assessment - 06/03/23 0001                Berg Balance Test    Sit to Stand Able to stand without using hands and stabilize independently     Standing Unsupported Able to stand 2 minutes with supervision     Sitting with Back Unsupported but Feet Supported on Floor or Stool Able to sit safely and securely 2 minutes     Stand to Sit Sits safely with minimal use of hands     Transfers Able to transfer safely, minor use of hands     Standing Unsupported with Eyes Closed Unable to keep eyes closed 3 seconds but stays steady     Standing Unsupported with  Feet Together Able to place feet together independently but unable to hold for 30 seconds                 Berg not finished due to time constraint and pt's c/o fatigue - pt very labile after completing above Berg balance assessment                                                                                                                              TREATMENT DATE: 06-17-23  GAIT: Gait pattern: step through pattern, decreased hip/knee flexion- Right, decreased hip/knee flexion- Left, and narrow BOS Distance walked: 345' Assistive device utilized: None Level of assistance: SBA and CGA Comments: Pt has tendency to festinate on initial standing, husband states this is better with cane, but this is contrary to mechanics noted in lobby.  She has improved stride without SPC, but difficulty w/ pathfinding carryover as she stills requires >50% cuing for turning when no immediate external end to path present by 3rd repeated lap.  -Advance retreat over 4 hurdle x15 each LE w/ intermittent LUE support on ballet bar, CGA, decreased clearance of left heel and RLE fatigue in SLS -Midline 8 cone taps x20 > x10 forward alternating UE cone taps (repeated x2), CGA using LUE support for toe taps only -Crossbody 8 cone taps for left/right discrimination and lower trunk rotation w/ LUE support x20 > ipsilateral cone taps for increased discrimination and coordination challenge x20, x3 instance of difficulty coordinating RLE to color assigned, CGA and min cues to correct RLE placement -Attempted advanced dual task L/R discrimination w/ large stepping to color targets, pt needs max cuing and CGA-minA w/ facilitation of weight shift and firm step to prevent LOB  GAIT: Gait pattern: step through pattern, decreased stride length, decreased hip/knee flexion- Right, decreased hip/knee flexion- Left, and narrow BOS Distance walked: 115' Assistive device utilized: None Level of assistance: SBA and CGA Comments:  Gait mechanics largely unchanged following pre-gait/NMR tasks.  Attempted cues for scanning for notable landmarks to assist with anticipating turning on pathway without success.  PATIENT EDUCATION: Education details:  Re-iterated 6/16:  EMPHASIZED NEED TO DO THE STANDING BALANCE HEP AS ISSUED ON 06-07-23.  Discussed ankle weights to help keep LE and bottom down when working in the pool - will discuss w/ Ottie Blonder. Person educated: Patient and Spouse Education method: Medical illustrator Education comprehension: verbalized understanding  HOME EXERCISE PROGRAM: HEP: Balance HEP Access Code: [email or web address] URL: [email or web address] Date: 06/07/2023 Prepared by: Johnnette Nakayama  Exercises - Standing Hip Flexion with Counter Support  - 1 x daily - 7 x weekly - 1 sets - 10 reps - Standing Hip Abduction with Counter Support  - 1 x daily - 7 x weekly - 1 sets - 10 reps - Standing March with Counter Support  - 1 x daily - 7 x weekly - 1 sets - 10 reps - Standing Hip Extension with Counter Support  - 1  x daily - 7 x weekly - 1 sets - 10 reps  Also - added touching 2 targets (paper plate or solo cup) in standing with assist of husband or standing at counter; 5 reps each leg - 1x/day  GOALS: Goals reviewed with patient? Yes  SHORT TERM GOALS: Target date: 06-28-23  Pt will increase Berg balance test score by at least 4 points to demo improved balance & reduced fall risk.  Baseline: Goal status: INITIAL  2.  Improve TUG score to </= 13.5 secs without device for reduced fall risk. Baseline: 15.75 secs without device Goal status: INITIAL  3.  Increase gait velocity to >/= 2.9 ft/sec without device for increased gait efficiency. Baseline: 2.56 ft/sec without device Goal status: INITIAL  4.  Pt will ambulate 350' on flat, even surface with SBA with minimal festination of gait with initiation of steps. Baseline: moderate to severe festination with initiation during eval on 06-03-23 Goal status: INITIAL  5.  Improve DGA by at least 4 points to demo increased safety with ambulation. Baseline:  to be assessed Goal status: INITIAL  6.  Caregiver (husband) to assist pt with land HEP consisting of balance and  strengthening exercises. Baseline:  Goal status: INITIAL  LONG TERM GOALS: Target date: 08-02-23  Pt will increase Berg balance test score by at least 8 points to demo improved balance & reduced fall risk.  Baseline:  Goal status: INITIAL  2.  Pt will amb. 500' without device with SBA on even and uneven surfaces with minimal gait deviations exhibited and no LOB.   Baseline:  Goal status: INITIAL  3.  Increase DGI score by at least 8 points to demo improved balance and safety with ambulation.  Baseline: TBA Goal status: INITIAL  4.  Perform floor to stand transfer with UE support on object with supervision.  Baseline:  Goal status: INITIAL  5.  Husband will report pt's ability to negotiate steps at their home with use of rails without Rt heel catching edge of step. Baseline:  Goal status: INITIAL  6.  Husband to assist pt in performing updated HEP for land and aquatic based exercises for water walking, balance and strengthening.  Baseline:  Goal status: INITIAL  ASSESSMENT:  CLINICAL IMPRESSION: Structured land session with gait followed by neural priming for second bout of gait with minimal carryover noted in mechanics.  She seems to have more difficulty with immediate standing balance and stepping today with some regulation of gait cycle when taking away the cane in clinic.  Path finding and coordination remain challenging due to cognitive changes and left-right discrimination.  She continues to benefit from skilled PT to address deficits as outlined in skilled PT POC.  OBJECTIVE IMPAIRMENTS: Abnormal gait, decreased activity tolerance, decreased balance, decreased cognition, decreased endurance, decreased strength, impaired tone, and impaired UE functional use.   ACTIVITY LIMITATIONS: carrying, lifting, bending, stairs, bathing, toileting, hygiene/grooming, and locomotion level  PARTICIPATION LIMITATIONS: meal prep, cleaning, laundry, driving, shopping, and community  activity  PERSONAL FACTORS: Age, Behavior pattern, Past/current experiences, and 1-2 comorbidities: h/o previous CVA's and h/o vascular dementia are also affecting patient's functional outcome.   REHAB POTENTIAL: Good  CLINICAL DECISION MAKING: Evolving/moderate complexity  EVALUATION COMPLEXITY: Moderate  PLAN:  PT FREQUENCY: 2x/week  PT DURATION: 8 weeks + eval   PLANNED INTERVENTIONS: 97110-Therapeutic exercises, 97530- Therapeutic activity, [id number]- Neuromuscular re-education, [phone]- Self Care, [id number]- Gait training, [phone]- Aquatic Therapy, and Patient/Family education  PLAN FOR NEXT SESSION: LAND:  gait training without  device - pt carries SPC but does not consistently use correctly; balance training - anything to improve SLS and coordination RLE- blaze pods, targets, etc.; core stabilization - (pt noted to have Rt lateral trunk flexion/lean), immediate standing balance and STS into step, stride balance tasks for modified SLS, scanning tasks  Aquatics; water walking, balance exercises with gradual decrease in UE support, do ankle weights help keep BLE/bottom down in pool?  Earlean Glaze, PT, DPT 06/17/2023, 1:30 PM

## 2023-06-19 ENCOUNTER — Ambulatory Visit: Payer: Self-pay | Admitting: Physical Therapy

## 2023-06-19 ENCOUNTER — Encounter: Payer: Self-pay | Admitting: Physical Therapy

## 2023-06-19 DIAGNOSIS — I69351 Hemiplegia and hemiparesis following cerebral infarction affecting right dominant side: Secondary | ICD-10-CM

## 2023-06-19 DIAGNOSIS — R278 Other lack of coordination: Secondary | ICD-10-CM | POA: Diagnosis not present

## 2023-06-19 DIAGNOSIS — R29818 Other symptoms and signs involving the nervous system: Secondary | ICD-10-CM | POA: Diagnosis not present

## 2023-06-19 DIAGNOSIS — R2689 Other abnormalities of gait and mobility: Secondary | ICD-10-CM

## 2023-06-19 DIAGNOSIS — M6281 Muscle weakness (generalized): Secondary | ICD-10-CM | POA: Diagnosis not present

## 2023-06-19 DIAGNOSIS — R2681 Unsteadiness on feet: Secondary | ICD-10-CM

## 2023-06-19 DIAGNOSIS — R41844 Frontal lobe and executive function deficit: Secondary | ICD-10-CM | POA: Diagnosis not present

## 2023-06-19 DIAGNOSIS — R41842 Visuospatial deficit: Secondary | ICD-10-CM | POA: Diagnosis not present

## 2023-06-19 DIAGNOSIS — R41840 Attention and concentration deficit: Secondary | ICD-10-CM | POA: Diagnosis not present

## 2023-06-19 NOTE — Therapy (Signed)
 OUTPATIENT PHYSICAL THERAPY NEURO TREATMENT   Patient Name: Jacqueline Bonilla MRN: [id number] DOB:12/05/1955, 68 y.o., female Today's Date: 06/19/2023   PCP: Jacqueline Hammersmith, MD REFERRING PROVIDER: Genetta Kenning, MD  END OF SESSION:  PT End of Session - 06/19/23 0939     Visit Number 6    Number of Visits 17   combination of land/aquatic visits   Date for PT Re-Evaluation 08/02/23   pushed out 1 week due to scheduling   Authorization Type [employer]    Authorization Time Period 06-03-23 - 08-03-23    Authorization - Visit Number 6    Authorization - Number of Visits 17    Progress Note Due on Visit 10    PT Start Time 0934    PT Stop Time 1013    PT Time Calculation (min) 39 min    Equipment Utilized During Treatment Gait belt   large noodle, large barbell   Activity Tolerance Patient tolerated treatment well    Behavior During Therapy WFL for tasks assessed/performed            Past Medical History:  Diagnosis Date   Anemia    Anxiety    Depression    Dysmenorrhea    Endometriosis    Fibroid    Hypertension    Implantable loop recorder present 2021   Microhematuria    negative workup   Osteoporosis    Tachycardia    Thrombocytopenia (HCC)    Past Surgical History:  Procedure Laterality Date   BREAST BIOPSY     BUBBLE STUDY  12/08/2020   Procedure: BUBBLE STUDY;  Surgeon: Loyde Rule, MD;  Location: [facility] ENDOSCOPY;  Service: Cardiovascular;;   CESAREAN SECTION     hysteroscopic resection     implantable loop recorder implant  10/21/2019   Medtronic Reveal Linq model LNQ 22 ([place] [id number]) implantable loop recorder   IR CT HEAD LTD  12/01/2020   IR PERCUTANEOUS ART THROMBECTOMY/INFUSION INTRACRANIAL INC DIAG ANGIO  12/01/2020   IR US  GUIDE VASC ACCESS RIGHT  12/01/2020   ORIF HUMERUS FRACTURE Left 07/31/2021   Procedure: OPEN REDUCTION INTERNAL FIXATION (ORIF) DISTAL HUMERUS FRACTURE;  Surgeon: Laneta Pintos, MD;  Location: [facility] OR;  Service:  Orthopedics;  Laterality: Left;   RADIOLOGY WITH ANESTHESIA N/A 12/01/2020   Procedure: IR WITH ANESTHESIA - CODE STROKE;  Surgeon: Radiologist, Medication, MD;  Location: [facility] OR;  Service: Radiology;  Laterality: N/A;   TEE WITHOUT CARDIOVERSION N/A 12/08/2020   Procedure: TRANSESOPHAGEAL ECHOCARDIOGRAM (TEE);  Surgeon: Loyde Rule, MD;  Location: [facility] ENDOSCOPY;  Service: Cardiovascular;  Laterality: N/A;   Patient Active Problem List   Diagnosis Date Noted   Diarrhea 05/24/2023   Gait disturbance, 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   Hemiparesis affecting right side as late effect of cerebrovascular accident (CVA) (HCC) 04/23/2023   Apraxia, 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   Frequent falls 02/11/2023   Crushing injury of right hip 02/11/2023   Statin intolerance 04/24/2022   Internal hemorrhoids 01/10/2021   Dyslipidemia 12/29/2020   History of CVA (cerebrovascular accident) 07/14/2020   Normocytic anemia 07/11/2020   Vascular dementia without behavioral disturbance (HCC) 07/15/2019   History of COVID-19 04/22/2019   Essential hypertension 03/03/2019    ONSET DATE: 03-27-23 (Lt MCA CVA)  REFERRING DIAG: I69.398,R26.9 (ICD-10-CM) - Gait disturbance, post-stroke I69.351 (ICD-10-CM) - Hemiparesis affecting right side as late effect of cerebrovascular accident (CVA) (HCC)  THERAPY  DIAG:  Other abnormalities of gait and mobility  Unsteadiness on feet  Muscle weakness (generalized)  Other lack of coordination  Hemiplegia and hemiparesis following cerebral infarction affecting right dominant side (HCC)  Rationale for Evaluation and Treatment: Rehabilitation  SUBJECTIVE:                                                                                                                                                                                             SUBJECTIVE STATEMENT: Pt presents for land therapy w/o  SPC today as she forgot it.  She is with her husband.  She denies recent falls or acute changes.  Husband reports he was very encouraged by last session. Pt accompanied by: spouse, Jacqueline Bonilla  PERTINENT HISTORY:  Per chart note 04-01-23 Jacqueline Bonilla is a 68 y.o. 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  She did have a loop recorder placed at that time and maintained on aspirin .  She was taken off Brilinta  in February 2025.  Patient received inpatient rehab services 12/08/2020 - 12/17/2020 and was discharged to home contact-guard assist for ambulation.  Per chart review she lives with spouse.  1 level home 5 steps to entry.  Presented 03/27/2023 with aphasia altered mental status and unstable gait with frequent falls.  CT/MRI showed small acute left MCA infarction.  Severe chronic small vessel ischemic disease and cerebral atrophy.  CTA showed no large vessel occlusion.  She did receive TNK.  Admission chemistries unremarkable urine drug screen positive marijuana.  Echocardiogram with ejection fraction of 60 to 65% no wall motion abnormalities grade 1 diastolic dysfunction.  Neurology follow-up maintained on aspirin  as well as Brilinta  90 mg twice daily x 4 weeks then aspirin  alone with study drug followed by neurology services.  Tolerating a regular consistency diet.  Therapy evaluations completed due to patient decreased functional mobility was admitted for a comprehensive rehab program.  Secondary diagnosis History of stroke History of seizure Hypertension Hyperlipidemia Substance use-marijuana Vascular dementia  h/o loop recorder implant Oct. 2021; h/o Lt humerus fracture July 2023    PAIN:  Are you having pain? Yes: NPRS scale: 3/10 Pain location: Rt wrist and fingers Pain  description: sharp Aggravating factors: moving it Relieving factors: at rest  - not moving it *will request order for OT from Dr. Sharl Davies - PT will not address wrist pain as out of scope of referral  PRECAUTIONS: Other: No driving  RED FLAGS: None   WEIGHT BEARING RESTRICTIONS: No  FALLS:  Has patient fallen in last 6 months? Yes. Number of falls 12+ - some due to dizzy spells - due to change in medication (unsure if this is what caused stroke, per stroke)  LIVING ENVIRONMENT: Lives with: lives with their spouse Lives in: House/apartment Stairs: Yes: External: 3 steps; can reach both Has following equipment at home: Single point cane and Grab bars  PLOF: Independent with basic ADLs, Independent with household mobility without device, Independent with community mobility without device, and Needs assistance with homemaking  PATIENT GOALS: improve walking and balance  OBJECTIVE:  Note: Objective measures were completed at Evaluation unless otherwise noted.  DIAGNOSTIC FINDINGS: IMPRESSION:MRI Brain without contrast 03-28-23 Small acute infarct in the posterior left frontal cortex. Some of the diffusion hyperintensity on yesterday's scan was reversible.   Advanced, widespread chronic ischemic injury with atrophy.    COGNITION: Overall cognitive status: Impaired and pt has vascular dementia per chart notes   SENSATION: WFL  COORDINATION: RLE WFL's; pt demonstrates festinating gait upon initial sit to stand with step initiation for gait  POSTURE: rounded shoulders and forward head  LOWER EXTREMITY ROM:   WFL's bil. LE's  LOWER EXTREMITY MMT:  LLE WNL's  MMT Right Eval Left Eval  Hip flexion 3+   Hip extension    Hip abduction    Hip adduction    Hip internal rotation    Hip external rotation    Knee flexion 4   Knee extension 5   Ankle dorsiflexion 4   Ankle plantarflexion    Ankle inversion    Ankle eversion    (Blank rows = not tested)  BED MOBILITY:   Findings: Independent  TRANSFERS: Sit to stand: Modified independence  Assistive device utilized: Single point cane     Pt able to perform sit to stand from mat without UE support  RAMP:  Not tested  CURB:  Not tested  STAIRS: Findings: Level of Assistance: SBA, Stair Negotiation Technique: Step to Pattern with Bilateral Rails, Number of Stairs: 4, Height of Stairs: 6   , and Comments: husband reports pt does not clear heel of Rt foot with step descension at their home GAIT: Findings: Gait Characteristics: pt demonstrates festinating gait for initial steps then amb. with, step through pattern, decreased arm swing- Right, lateral lean- Right, and decreased trunk rotation, Distance walked: 100', Assistive device utilized:Single point cane, Level of assistance: CGA, and Comments: festinating gait for initial steps when initiating ambulation; pt carried SPC from lobby to clinic gym - did not use consistently   FUNCTIONAL TESTS:  5 times sit to stand: 14.79 secs without UE support Timed up and go (TUG): 15.75 secs without device 10 meter walk test: 12.82 secs without device = 2.56 ft/sec     [facility] PT Assessment - 06/03/23 0001                Berg Balance Test    Sit to Stand Able to stand without using hands and stabilize independently     Standing Unsupported Able to stand 2 minutes with supervision     Sitting with Back Unsupported but Feet Supported on Floor or Stool Able to sit safely and securely 2 minutes     Stand to Sit Sits safely with minimal use of hands     Transfers Able to transfer safely, minor use of hands     Standing Unsupported with Eyes Closed Unable to keep eyes closed 3 seconds but stays steady     Standing Unsupported with  Feet Together Able to place feet together independently but unable to hold for 30 seconds                 Berg not finished due to time constraint and pt's c/o fatigue - pt very labile after completing above Berg balance assessment                                                                                                                              TREATMENT DATE: 06-19-23  -STS w/ LLE step out x15 > repeated w/ RLE x15 > repeated alternating LE for coordination challenge x30, pt has a slight left preference for bilateral foot placement that improves w/ return demo, mod cues, and repetition -Stride stance CGA volleyball bimanual bouncing x10 each LE forward, intermittent dropping w/ PT retrieving ball for safety -Stride stance reach to 8 cone for alternating UE taps x10 each foot in front -Squigz placement on mirror in long stride stance using bimanual manipulation x9 squigz, increased time and cues for problem solving placement attempts  PATIENT EDUCATION: Education details:  Re-iterated 6/16: EMPHASIZED NEED TO DO THE STANDING BALANCE HEP AS ISSUED ON 06-07-23.  Discussed schedule and talking to primary PT regarding pool schedule and adding visits if needed. Person educated: Patient and Spouse Education method: Medical illustrator Education comprehension: verbalized understanding  HOME EXERCISE PROGRAM: HEP: Balance HEP Access Code: [email or web address] URL: [email or web address] Date: 06/07/2023 Prepared by: Johnnette Nakayama  Exercises - Standing Hip Flexion with Counter Support  - 1 x daily - 7 x weekly - 1 sets - 10 reps - Standing Hip Abduction with Counter Support  - 1 x daily - 7 x weekly - 1 sets - 10 reps - Standing March with Counter Support  - 1 x daily - 7 x weekly - 1 sets - 10 reps - Standing Hip Extension with Counter Support  - 1 x daily - 7 x weekly - 1 sets - 10 reps  Also - added touching 2 targets (paper plate or solo cup) in standing with assist of husband or standing at counter; 5 reps each leg - 1x/day  GOALS: Goals reviewed with patient? Yes  SHORT TERM GOALS: Target date: 06-28-23  Pt will increase Berg balance test score by at least 4 points to demo improved balance & reduced fall  risk.  Baseline: Goal status: INITIAL  2.  Improve TUG score to </= 13.5 secs without device for reduced fall risk. Baseline: 15.75 secs without device Goal status: INITIAL  3.  Increase gait velocity to >/= 2.9 ft/sec without device for increased gait efficiency. Baseline: 2.56 ft/sec without device Goal status: INITIAL  4.  Pt will ambulate 350' on flat, even surface with SBA with minimal festination of gait with initiation of steps. Baseline: moderate to severe festination with initiation during eval on 06-03-23 Goal status: INITIAL  5.  Improve DGA by at least 4 points to demo increased safety with ambulation.  Baseline:  to be assessed Goal status: INITIAL  6.  Caregiver (husband) to assist pt with land HEP consisting of balance and strengthening exercises. Baseline:  Goal status: INITIAL  LONG TERM GOALS: Target date: 08-02-23  Pt will increase Berg balance test score by at least 8 points to demo improved balance & reduced fall risk.  Baseline:  Goal status: INITIAL  2.  Pt will amb. 500' without device with SBA on even and uneven surfaces with minimal gait deviations exhibited and no LOB.   Baseline:  Goal status: INITIAL  3.  Increase DGI score by at least 8 points to demo improved balance and safety with ambulation.  Baseline: TBA Goal status: INITIAL  4.  Perform floor to stand transfer with UE support on object with supervision.  Baseline:  Goal status: INITIAL  5.  Husband will report pt's ability to negotiate steps at their home with use of rails without Rt heel catching edge of step. Baseline:  Goal status: INITIAL  6.  Husband to assist pt in performing updated HEP for land and aquatic based exercises for water walking, balance and strengthening.  Baseline:  Goal status: INITIAL  ASSESSMENT:  CLINICAL IMPRESSION: Emphasis of skilled PT session today on further addressing stride length and immediate standing stability.  She demonstrates good progression  with repetition, but PT unsure of carryover from task to task.  She does has somewhat increased stride length when ambulating out of session.  She returns to primary PT next visit who can clarify need for further aquatic services to supplement land as pt has done well with this therapist and tolerated all NMR and gait training over recent sessions without issue.  Continue per POC.  OBJECTIVE IMPAIRMENTS: Abnormal gait, decreased activity tolerance, decreased balance, decreased cognition, decreased endurance, decreased strength, impaired tone, and impaired UE functional use.   ACTIVITY LIMITATIONS: carrying, lifting, bending, stairs, bathing, toileting, hygiene/grooming, and locomotion level  PARTICIPATION LIMITATIONS: meal prep, cleaning, laundry, driving, shopping, and community activity  PERSONAL FACTORS: Age, Behavior pattern, Past/current experiences, and 1-2 comorbidities: h/o previous CVA's and h/o vascular dementia are also affecting patient's functional outcome.   REHAB POTENTIAL: Good  CLINICAL DECISION MAKING: Evolving/moderate complexity  EVALUATION COMPLEXITY: Moderate  PLAN:  PT FREQUENCY: 2x/week  PT DURATION: 8 weeks + eval   PLANNED INTERVENTIONS: 97110-Therapeutic exercises, 97530- Therapeutic activity, [id number]- Neuromuscular re-education, [phone]- Self Care, [id number]- Gait training, [phone]- Aquatic Therapy, and Patient/Family education  PLAN FOR NEXT SESSION: LAND:  gait training without device - pt carries SPC but does not consistently use correctly; balance training - anything to improve SLS and coordination RLE- blaze pods, targets, etc.; core stabilization - (pt noted to have Rt lateral trunk flexion/lean), immediate standing balance, modified SLS, scanning tasks, backwards stepping, stride length, relaxing RUE during activity  Aquatics; water walking, balance exercises with gradual decrease in UE support, do ankle weights help keep BLE/bottom down in pool?  Earlean Glaze, PT, DPT 06/19/2023, 10:15 AM

## 2023-06-20 ENCOUNTER — Ambulatory Visit: Payer: Self-pay | Admitting: Cardiology

## 2023-06-20 ENCOUNTER — Encounter: Attending: Physical Medicine & Rehabilitation | Admitting: Physical Medicine & Rehabilitation

## 2023-06-20 ENCOUNTER — Encounter: Payer: Self-pay | Admitting: Physical Medicine & Rehabilitation

## 2023-06-20 ENCOUNTER — Ambulatory Visit (INDEPENDENT_AMBULATORY_CARE_PROVIDER_SITE_OTHER)

## 2023-06-20 VITALS — BP 127/81 | HR 90 | Ht 64.0 in | Wt 117.0 lb

## 2023-06-20 DIAGNOSIS — I69398 Other sequelae of cerebral infarction: Secondary | ICD-10-CM | POA: Diagnosis not present

## 2023-06-20 DIAGNOSIS — R269 Unspecified abnormalities of gait and mobility: Secondary | ICD-10-CM | POA: Insufficient documentation

## 2023-06-20 DIAGNOSIS — I639 Cerebral infarction, unspecified: Secondary | ICD-10-CM

## 2023-06-20 DIAGNOSIS — I69390 Apraxia following cerebral infarction: Secondary | ICD-10-CM | POA: Insufficient documentation

## 2023-06-20 LAB — CUP PACEART REMOTE DEVICE CHECK
Date Time Interrogation Session: 20250618231252
Implantable Pulse Generator Implant Date: 20211020

## 2023-06-20 NOTE — Patient Instructions (Signed)
 Gait apraxia

## 2023-06-20 NOTE — Progress Notes (Signed)
 Subjective:    Patient ID: Jacqueline Bonilla, female    DOB: [birth year], 68 y.o.   MRN: [id number]  68 y.o. 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  She did have a loop recorder placed at that time and maintained on aspirin .  She was taken off Brilinta  in February 2025.  Patient received inpatient rehab services 12/08/2020 - 12/17/2020 and was discharged to home contact-guard assist for ambulation.  Per chart review she lives with spouse.  1 level home 5 steps to entry.  Presented 03/27/2023 with aphasia altered mental status and unstable gait with frequent falls.  CT/MRI showed small acute left MCA infarction.  Severe chronic small vessel ischemic disease and cerebral atrophy.  CTA showed no large vessel occlusion.  She did receive TNK.  Admission chemistries unremarkable urine drug screen positive marijuana.  Echocardiogram with ejection fraction of 60 to 65% no wall motion abnormalities grade 1 diastolic dysfunction.  Neurology follow-up maintained on aspirin  as well as Brilinta  90 mg twice daily x 4 weeks then aspirin  alone with study drug followed by neurology services.  Tolerating a regular consistency diet.  Therapy evaluations completed due to patient decreased functional mobility was admitted for a comprehensive rehab program.   Admit date: 04/01/2023 Discharge date: 04/10/2023 HPI The patient is upset today because last night she had a freezing episode where she felt like she could not move while going to the bathroom.  She had no difficulty this morning or this afternoon.  She walked in the clinic using a cane without physical assistance from her husband. We discussed that these episodes were seen during her inpatient rehab hospitalization and she has gait apraxia.  We discussed  that this was a condition where her strength is intact however the movement patterns that are usually automatic for mobility can sometimes fail to function properly. She continues to receive outpatient physical therapy at neuro rehab.   Pain Inventory Average Pain 3 Pain Right Now 3 My pain is aching  In the last 24 hours, has pain interfered with the following? General activity 0 Relation with others 0 Enjoyment of life 0 What TIME of day is your pain at its worst? varies Sleep (in general) Good  Pain is worse with: unsure Pain improves with: brace Relief from Meds: .  Family History  Problem Relation Age of Onset   Cancer Father        non hodgkin lymphoma & skin   Heart attack Maternal Grandfather    Dementia Mother    Polymyositis Sister    Social History   Socioeconomic History   Marital status: Married    Spouse name: Actor   Number of children: 1   Years of education: Not on file   Highest education level: Not on file  Occupational History   Occupation: Audiological scientist   Occupation: Retired  Tobacco Use   Smoking status: Never   Smokeless tobacco: Never  Vaping Use   Vaping status: Never Used  Substance and Sexual Activity   Alcohol use: Never   Drug use: Never   Sexual activity: Not Currently    Partners: Male    Comment: husband vasectomy  Other Topics Concern   Not on file  Social History Narrative   Lives with husband   Grandchildren - 1   Works - Audiological scientist  Set belt 100%   Gun in home - yes - secured      Right handed   Caffeine: maybe tea every now and then   Social Drivers of Health   Financial Resource Strain: Low Risk  (10/23/2022)   Overall Financial Resource Strain (CARDIA)    Difficulty of Paying Living Expenses: Not hard at all  Food Insecurity: No Food Insecurity (03/27/2023)   Hunger Vital Sign    Worried About Running Out of Food in the Last Year: Never true    Ran Out of Food in the Last Year: Never true  Transportation  Needs: No Transportation Needs (03/27/2023)   PRAPARE - Administrator, Civil Service (Medical): No    Lack of Transportation (Non-Medical): No  Physical Activity: Sufficiently Active (10/23/2022)   Exercise Vital Sign    Days of Exercise per Week: 6 days    Minutes of Exercise per Session: 40 min  Stress: No Stress Concern Present (08/28/2021)   [employer] of Occupational Health - Occupational Stress Questionnaire    Feeling of Stress : Not at all  Social Connections: Moderately Isolated (03/27/2023)   Social Connection and Isolation Panel    Frequency of Communication with Friends and Family: Once a week    Frequency of Social Gatherings with Friends and Family: Once a week    Attends Religious Services: Never    Database administrator or Organizations: Yes    Attends Banker Meetings: Never    Marital Status: Married   Past Surgical History:  Procedure Laterality Date   BREAST BIOPSY     BUBBLE STUDY  12/08/2020   Procedure: BUBBLE STUDY;  Surgeon: Loyde Rule, MD;  Location: [facility] ENDOSCOPY;  Service: Cardiovascular;;   CESAREAN SECTION     hysteroscopic resection     implantable loop recorder implant  10/21/2019   Medtronic Reveal Linq model LNQ 22 ([place] [id number]) implantable loop recorder   IR CT HEAD LTD  12/01/2020   IR PERCUTANEOUS ART THROMBECTOMY/INFUSION INTRACRANIAL INC DIAG ANGIO  12/01/2020   IR US  GUIDE VASC ACCESS RIGHT  12/01/2020   ORIF HUMERUS FRACTURE Left 07/31/2021   Procedure: OPEN REDUCTION INTERNAL FIXATION (ORIF) DISTAL HUMERUS FRACTURE;  Surgeon: Laneta Pintos, MD;  Location: [facility] OR;  Service: Orthopedics;  Laterality: Left;   RADIOLOGY WITH ANESTHESIA N/A 12/01/2020   Procedure: IR WITH ANESTHESIA - CODE STROKE;  Surgeon: Radiologist, Medication, MD;  Location: [facility] OR;  Service: Radiology;  Laterality: N/A;   TEE WITHOUT CARDIOVERSION N/A 12/08/2020   Procedure: TRANSESOPHAGEAL ECHOCARDIOGRAM (TEE);  Surgeon: Loyde Rule, MD;   Location: [facility] ENDOSCOPY;  Service: Cardiovascular;  Laterality: N/A;   Past Surgical History:  Procedure Laterality Date   BREAST BIOPSY     BUBBLE STUDY  12/08/2020   Procedure: BUBBLE STUDY;  Surgeon: Loyde Rule, MD;  Location: [facility] ENDOSCOPY;  Service: Cardiovascular;;   CESAREAN SECTION     hysteroscopic resection     implantable loop recorder implant  10/21/2019   Medtronic Reveal Linq model LNQ 22 ([place] [id number]) implantable loop recorder   IR CT HEAD LTD  12/01/2020   IR PERCUTANEOUS ART THROMBECTOMY/INFUSION INTRACRANIAL INC DIAG ANGIO  12/01/2020   IR US  GUIDE VASC ACCESS RIGHT  12/01/2020   ORIF HUMERUS FRACTURE Left 07/31/2021   Procedure: OPEN REDUCTION INTERNAL FIXATION (ORIF) DISTAL HUMERUS FRACTURE;  Surgeon: Laneta Pintos, MD;  Location: [facility] OR;  Service: Orthopedics;  Laterality: Left;   RADIOLOGY WITH ANESTHESIA  N/A 12/01/2020   Procedure: IR WITH ANESTHESIA - CODE STROKE;  Surgeon: Radiologist, Medication, MD;  Location: [facility] OR;  Service: Radiology;  Laterality: N/A;   TEE WITHOUT CARDIOVERSION N/A 12/08/2020   Procedure: TRANSESOPHAGEAL ECHOCARDIOGRAM (TEE);  Surgeon: Loyde Rule, MD;  Location: [facility] ENDOSCOPY;  Service: Cardiovascular;  Laterality: N/A;   Past Medical History:  Diagnosis Date   Anemia    Anxiety    Depression    Dysmenorrhea    Endometriosis    Fibroid    Hypertension    Implantable loop recorder present 2021   Microhematuria    negative workup   Osteoporosis    Tachycardia    Thrombocytopenia (HCC)    BP 127/81   Pulse 90   Ht 5' 4 (1.626 m)   Wt 117 lb (53.1 kg)   LMP 01/02/2003   SpO2 95%   BMI 20.08 kg/m   Opioid Risk Score:   Fall Risk Score:  `1  Depression screen [facility] 2/9     04/23/2023    1:56 PM 04/17/2023   10:18 AM 02/11/2023    1:04 PM 04/24/2022    9:22 AM 01/23/2022    2:04 PM 10/23/2021    2:23 PM 08/28/2021    9:58 AM  Depression screen PHQ 2/9  Decreased Interest 0 0 0 0 0 0 0  Down, Depressed, Hopeless 0 0 0  0 0 0 0  PHQ - 2 Score 0 0 0 0 0 0 0  Altered sleeping 0 0       Tired, decreased energy 1 3       Change in appetite 0 0       Feeling bad or failure about yourself  0 0       Trouble concentrating 0 0       Moving slowly or fidgety/restless 0 3       Suicidal thoughts 0 0       PHQ-9 Score 1 6       Difficult doing work/chores  Somewhat difficult           Review of Systems  Musculoskeletal:        Right wrist pain  All other systems reviewed and are negative.     Objective:   Physical Exam Positive emotional lability Motor strength is 4/5 in the right deltoid bicep tricep grip 5/5 in the left deltoid bicep tricep grip 5/5 bilateral hip flexor knee extensor ankle dorsiflexor Ambulates without assistive device no evidence of toe drag or knee instability she was able to toe walk as well as heel walk at least for several steps. Finger-nose-finger testing shows mild to moderate dysmetria on the right side. Speech mildly pressured, minimal word finding deficit       Assessment & Plan:   #1.  History of left MCA infarct x 2 with right upper extremity weakness decreased motor control as well as a gait apraxia.  We discussed that strategies to unblock some of these freezing may involve using a target to step up on or step over.  A laser light can be used for this purpose.  Husband is going to look into this. Continue outpatient PT as well as home exercise program which she is doing in the pool. Discussed the patient's diagnosis as well as impairments as well as problem solving.  Return to clinic in 3 months

## 2023-06-24 ENCOUNTER — Ambulatory Visit: Payer: Self-pay | Admitting: Physical Therapy

## 2023-06-24 DIAGNOSIS — R2681 Unsteadiness on feet: Secondary | ICD-10-CM | POA: Diagnosis not present

## 2023-06-24 DIAGNOSIS — R2689 Other abnormalities of gait and mobility: Secondary | ICD-10-CM

## 2023-06-24 DIAGNOSIS — R278 Other lack of coordination: Secondary | ICD-10-CM

## 2023-06-24 DIAGNOSIS — R41844 Frontal lobe and executive function deficit: Secondary | ICD-10-CM | POA: Diagnosis not present

## 2023-06-24 DIAGNOSIS — R29818 Other symptoms and signs involving the nervous system: Secondary | ICD-10-CM | POA: Diagnosis not present

## 2023-06-24 DIAGNOSIS — M6281 Muscle weakness (generalized): Secondary | ICD-10-CM | POA: Diagnosis not present

## 2023-06-24 DIAGNOSIS — R41842 Visuospatial deficit: Secondary | ICD-10-CM | POA: Diagnosis not present

## 2023-06-24 DIAGNOSIS — R41840 Attention and concentration deficit: Secondary | ICD-10-CM | POA: Diagnosis not present

## 2023-06-24 DIAGNOSIS — I69351 Hemiplegia and hemiparesis following cerebral infarction affecting right dominant side: Secondary | ICD-10-CM | POA: Diagnosis not present

## 2023-06-24 NOTE — Therapy (Unsigned)
 OUTPATIENT PHYSICAL THERAPY NEURO TREATMENT   Patient Name: Jacqueline Bonilla MRN: [id number] DOB:25-Feb-1955, 68 y.o., female Today's Date: 06/25/2023   PCP: Jacqueline Emil Schanz, MD REFERRING PROVIDER: Carilyn Prentice BRAVO, MD  END OF SESSION:  PT End of Session - 06/25/23 1935     Visit Number 7    Number of Visits 17   combination of land/aquatic visits   Date for PT Re-Evaluation 08/02/23   pushed out 1 week due to scheduling   Authorization Type [employer]    Authorization Time Period 06-03-23 - 08-03-23    Authorization - Visit Number 7    Authorization - Number of Visits 17    Progress Note Due on Visit 10    PT Start Time 0847    PT Stop Time 0931    PT Time Calculation (min) 44 min    Equipment Utilized During Treatment Gait belt   large noodle, large barbell   Activity Tolerance Patient tolerated treatment well    Behavior During Therapy WFL for tasks assessed/performed             Past Medical History:  Diagnosis Date   Anemia    Anxiety    Depression    Dysmenorrhea    Endometriosis    Fibroid    Hypertension    Implantable loop recorder present 2021   Microhematuria    negative workup   Osteoporosis    Tachycardia    Thrombocytopenia (HCC)    Past Surgical History:  Procedure Laterality Date   BREAST BIOPSY     BUBBLE STUDY  12/08/2020   Procedure: BUBBLE STUDY;  Surgeon: Delford Maude BROCKS, MD;  Location: [facility] ENDOSCOPY;  Service: Cardiovascular;;   CESAREAN SECTION     hysteroscopic resection     implantable loop recorder implant  10/21/2019   Medtronic Reveal Linq model LNQ 22 ([id number] [id number]) implantable loop recorder   IR CT HEAD LTD  12/01/2020   IR PERCUTANEOUS ART THROMBECTOMY/INFUSION INTRACRANIAL INC DIAG ANGIO  12/01/2020   IR US  GUIDE VASC ACCESS RIGHT  12/01/2020   ORIF HUMERUS FRACTURE Left 07/31/2021   Procedure: OPEN REDUCTION INTERNAL FIXATION (ORIF) DISTAL HUMERUS FRACTURE;  Surgeon: Kendal Franky SHAUNNA, MD;  Location: [facility] OR;   Service: Orthopedics;  Laterality: Left;   RADIOLOGY WITH ANESTHESIA N/A 12/01/2020   Procedure: IR WITH ANESTHESIA - CODE STROKE;  Surgeon: Radiologist, Medication, MD;  Location: [facility] OR;  Service: Radiology;  Laterality: N/A;   TEE WITHOUT CARDIOVERSION N/A 12/08/2020   Procedure: TRANSESOPHAGEAL ECHOCARDIOGRAM (TEE);  Surgeon: Delford Maude BROCKS, MD;  Location: [facility] ENDOSCOPY;  Service: Cardiovascular;  Laterality: N/A;   Patient Active Problem List   Diagnosis Date Noted   Diarrhea 05/24/2023   Gait disturbance, 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   Hemiparesis affecting right side as late effect of cerebrovascular accident (CVA) (HCC) 04/23/2023   Apraxia, 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   Frequent falls 02/11/2023   Crushing injury of right hip 02/11/2023   Statin intolerance 04/24/2022   Internal hemorrhoids 01/10/2021   Dyslipidemia 12/29/2020   History of CVA (cerebrovascular accident) 07/14/2020   Normocytic anemia 07/11/2020   Vascular dementia without behavioral disturbance (HCC) 07/15/2019   History of COVID-19 04/22/2019   Essential hypertension 03/03/2019    ONSET DATE: 03-27-23 (Lt MCA CVA)  REFERRING DIAG: I69.398,R26.9 (ICD-10-CM) - Gait disturbance, post-stroke I69.351 (ICD-10-CM) - Hemiparesis affecting right side as late effect of cerebrovascular accident (CVA) (HCC)  THERAPY DIAG:  Other abnormalities of gait and mobility  Unsteadiness on feet  Other lack of coordination  Rationale for Evaluation and Treatment: Rehabilitation  SUBJECTIVE:                                                                                                                                                                                             SUBJECTIVE STATEMENT: Pt ambulating to PT with use of SPC; reports no changes or problems since previous session last week Pt accompanied by: spouse, [name]  PERTINENT  HISTORY:  Per chart note 04-01-23 [name] is a 68 y.o. 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  She did have a loop recorder placed at that time and maintained on aspirin .  She was taken off Brilinta  in February 2025.  Patient received inpatient rehab services 12/08/2020 - 12/17/2020 and was discharged to home contact-guard assist for ambulation.  Per chart review she lives with spouse.  1 level home 5 steps to entry.  Presented 03/27/2023 with aphasia altered mental status and unstable gait with frequent falls.  CT/MRI showed small acute left MCA infarction.  Severe chronic small vessel ischemic disease and cerebral atrophy.  CTA showed no large vessel occlusion.  She did receive TNK.  Admission chemistries unremarkable urine drug screen positive marijuana.  Echocardiogram with ejection fraction of 60 to 65% no wall motion abnormalities grade 1 diastolic dysfunction.  Neurology follow-up maintained on aspirin  as well as Brilinta  90 mg twice daily x 4 weeks then aspirin  alone with study drug followed by neurology services.  Tolerating a regular consistency diet.  Therapy evaluations completed due to patient decreased functional mobility was admitted for a comprehensive rehab program.  Secondary diagnosis History of stroke History of seizure Hypertension Hyperlipidemia Substance use-marijuana Vascular dementia  h/o loop recorder implant Oct. 2021; h/o Lt humerus fracture July 2023    PAIN:  Are you having pain? Yes: NPRS scale: 3/10 Pain location: Rt wrist and fingers Pain description: sharp Aggravating factors: moving it Relieving factors: at rest  - not moving it *will request order for OT from Dr. Carilyn - PT will not address wrist pain as out of scope of  referral  PRECAUTIONS: Other: No driving  RED FLAGS: None   WEIGHT BEARING RESTRICTIONS: No  FALLS: Has patient fallen in last 6 months? Yes. Number of falls 12+ - some due to dizzy spells - due to change in medication (unsure if this is what caused stroke, per stroke)  LIVING ENVIRONMENT: Lives with: lives with their spouse Lives in: House/apartment Stairs: Yes: External: 3 steps; can reach both Has following equipment at home: Single point cane and Grab bars  PLOF: Independent with basic ADLs, Independent with household mobility without device, Independent with community mobility without device, and Needs assistance with homemaking  PATIENT GOALS: improve walking and balance  OBJECTIVE:  Note: Objective measures were completed at Evaluation unless otherwise noted.  DIAGNOSTIC FINDINGS: IMPRESSION:MRI Brain without contrast 03-28-23 Small acute infarct in the posterior left frontal cortex. Some of the diffusion hyperintensity on yesterday's scan was reversible.   Advanced, widespread chronic ischemic injury with atrophy.    COGNITION: Overall cognitive status: Impaired and pt has vascular dementia per chart notes   SENSATION: WFL  COORDINATION: RLE WFL's; pt demonstrates festinating gait upon initial sit to stand with step initiation for gait  POSTURE: rounded shoulders and forward head  LOWER EXTREMITY ROM:   WFL's bil. LE's  LOWER EXTREMITY MMT:  LLE WNL's  MMT Right Eval Left Eval  Hip flexion 3+   Hip extension    Hip abduction    Hip adduction    Hip internal rotation    Hip external rotation    Knee flexion 4   Knee extension 5   Ankle dorsiflexion 4   Ankle plantarflexion    Ankle inversion    Ankle eversion    (Blank rows = not tested)  BED MOBILITY:  Findings: Independent  TRANSFERS: Sit to stand: Modified independence  Assistive device utilized: Single point cane     Pt able to perform sit to stand from mat without UE support  RAMP:  Not  tested  CURB:  Not tested  STAIRS: Findings: Level of Assistance: SBA, Stair Negotiation Technique: Step to Pattern with Bilateral Rails, Number of Stairs: 4, Height of Stairs: 6   , and Comments: husband reports pt does not clear heel of Rt foot with step descension at their home GAIT: Findings: Gait Characteristics: pt demonstrates festinating gait for initial steps then amb. with, step through pattern, decreased arm swing- Right, lateral lean- Right, and decreased trunk rotation, Distance walked: 100', Assistive device utilized:Single point cane, Level of assistance: CGA, and Comments: festinating gait for initial steps when initiating ambulation; pt carried SPC from lobby to clinic gym - did not use consistently   FUNCTIONAL TESTS:  5 times sit to stand: 14.79 secs without UE support Timed up and go (TUG): 15.75 secs without device 10 meter walk test: 12.82 secs without device = 2.56 ft/sec     [facility] PT Assessment - 06/03/23 0001                Berg Balance Test    Sit to Stand Able to stand without using hands and stabilize independently     Standing Unsupported Able to stand 2 minutes with supervision     Sitting with Back Unsupported but Feet Supported on Floor or Stool Able to sit safely and securely 2 minutes     Stand to Sit Sits safely with minimal use of hands     Transfers Able to transfer safely, minor use of hands     Standing Unsupported with Eyes Closed Unable to keep eyes closed 3 seconds but stays steady     Standing Unsupported with Feet Together Able to place feet together independently but unable to hold for 30 seconds                 Berg not finished  due to time constraint and pt's c/o fatigue - pt very labile after completing above Berg balance assessment                                                                                                                             TREATMENT DATE: 06-24-23  Gait:  Pt gait trained 2 laps (115' x 2 reps) without  device with cues to relax RUE for increased arm swing during gait and to increase Rt step length - with CGA  Step training - pt negotiated 4 steps x 2 reps with use of bil. Hand rails - step over step sequence with ascension, step by step sequence with descension with SBA with use of bil. Hand rails  NeuroRe-ed:  SLS and coordination exercises for RLE:  Tap ups to 1st step 10 reps each foot with CGA without UE support on handrails Tap ups to 2nd step 10 reps each foot with min. UE support on handrails with CGA Marching in place 10 reps with CGA to min assist - no UE support used  Pt performed touching 3 balance bubbles - 5 reps each foot - with min assist- varied sequence of touching each target to facilitate balance with LE crossing midline Pt stood on [employer] inside // bars with CGA - performed horizontal head turns 5 reps with min assist for balance recovery  TherAct: Sit to stand transfers from mat - without UE support - cues to bring feet closer together as pt has tendency to place feet apart for WBOS Step ups onto 6 step with RLE leading for strengthening - 10 reps with UE support prn for assist with balance - CGA for safety            Rockerboard - inside // bars - 10 reps with bil. UE support, then 10 reps with LUE support only            Dynamic standing balance activity - placed 5 bean bags on mat behind her - pt reached for individual bean bag - alternating Rt/Lt side with turning around to toss in basket placed approx. 4' in front of her; pt used Lt hand for reaching/tossing  PATIENT EDUCATION: Education details:  Re-iterated 6/16: EMPHASIZED NEED TO DO THE STANDING BALANCE HEP AS ISSUED ON 06-07-23.  Discussed schedule and talking to primary PT regarding pool schedule and adding visits if needed. Person educated: Patient and Spouse Education method: Medical illustrator Education comprehension: verbalized understanding  HOME EXERCISE PROGRAM: HEP: Balance HEP Access  Code: [email or web address] URL: [email or web address] Date: 06/07/2023 Prepared by: Rock Kussmaul  Exercises - Standing Hip Flexion with Counter Support  - 1 x daily - 7 x weekly - 1 sets - 10 reps - Standing Hip Abduction with Counter Support  - 1 x daily - 7 x weekly - 1 sets - 10 reps - Standing March with Counter Support  - 1 x daily - 7 x weekly - 1 sets -  10 reps - Standing Hip Extension with Counter Support  - 1 x daily - 7 x weekly - 1 sets - 10 reps  Also - added touching 2 targets (paper plate or solo cup) in standing with assist of husband or standing at counter; 5 reps each leg - 1x/day  GOALS: Goals reviewed with patient? Yes  SHORT TERM GOALS: Target date: 06-28-23  Pt will increase Berg balance test score by at least 4 points to demo improved balance & reduced fall risk.  Baseline: Goal status: INITIAL  2.  Improve TUG score to </= 13.5 secs without device for reduced fall risk. Baseline: 15.75 secs without device Goal status: INITIAL  3.  Increase gait velocity to >/= 2.9 ft/sec without device for increased gait efficiency. Baseline: 2.56 ft/sec without device Goal status: INITIAL  4.  Pt will ambulate 350' on flat, even surface with SBA with minimal festination of gait with initiation of steps. Baseline: moderate to severe festination with initiation during eval on 06-03-23 Goal status: INITIAL  5.  Improve DGA by at least 4 points to demo increased safety with ambulation. Baseline:  to be assessed Goal status: INITIAL  6.  Caregiver (husband) to assist pt with land HEP consisting of balance and strengthening exercises. Baseline:  Goal status: INITIAL  LONG TERM GOALS: Target date: 08-02-23  Pt will increase Berg balance test score by at least 8 points to demo improved balance & reduced fall risk.  Baseline:  Goal status: INITIAL  2.  Pt will amb. 500' without device with SBA on even and uneven surfaces with minimal gait deviations exhibited and no LOB.    Baseline:  Goal status: INITIAL  3.  Increase DGI score by at least 8 points to demo improved balance and safety with ambulation.  Baseline: TBA Goal status: INITIAL  4.  Perform floor to stand transfer with UE support on object with supervision.  Baseline:  Goal status: INITIAL  5.  Husband will report pt's ability to negotiate steps at their home with use of rails without Rt heel catching edge of step. Baseline:  Goal status: INITIAL  6.  Husband to assist pt in performing updated HEP for land and aquatic based exercises for water walking, balance and strengthening.  Baseline:  Goal status: INITIAL  ASSESSMENT:  CLINICAL IMPRESSION: PT session focused on gait training without device with cues continued to be needed to relax RUE to increase arm swing in gait.  Session also focused on coordination activities to decrease ataxia in RLE and to improve SLS on each leg.  Pt continues to have slight festination of gait, specifically noted with gait initiation but gait pattern improves with normal step length as pt increases ambulation distance.  Pt continues to fatigue quickly and requires short seated rest periods.   Continue per POC.  OBJECTIVE IMPAIRMENTS: Abnormal gait, decreased activity tolerance, decreased balance, decreased cognition, decreased endurance, decreased strength, impaired tone, and impaired UE functional use.   ACTIVITY LIMITATIONS: carrying, lifting, bending, stairs, bathing, toileting, hygiene/grooming, and locomotion level  PARTICIPATION LIMITATIONS: meal prep, cleaning, laundry, driving, shopping, and community activity  PERSONAL FACTORS: Age, Behavior pattern, Past/current experiences, and 1-2 comorbidities: h/o previous CVA's and h/o vascular dementia are also affecting patient's functional outcome.   REHAB POTENTIAL: Good  CLINICAL DECISION MAKING: Evolving/moderate complexity  EVALUATION COMPLEXITY: Moderate  PLAN:  PT FREQUENCY: 2x/week  PT DURATION:  8 weeks + eval   PLANNED INTERVENTIONS: 97110-Therapeutic exercises, 97530- Therapeutic activity, [id number]- Neuromuscular re-education, 97535- Self  Care, [id number]- Gait training, [phone]- Aquatic Therapy, and Patient/Family education  PLAN FOR NEXT SESSION: LAND:  gait training without device - pt carries SPC but does not consistently use correctly; balance training - anything to improve SLS and coordination RLE- blaze pods, targets, etc.; core stabilization - (pt noted to have Rt lateral trunk flexion/lean)  Aquatics; water walking, balance exercises  Aryaa Bunting, [place], PT 06/25/2023, 7:38 PM

## 2023-06-25 ENCOUNTER — Encounter: Payer: Self-pay | Admitting: Physical Therapy

## 2023-06-26 ENCOUNTER — Other Ambulatory Visit (HOSPITAL_COMMUNITY): Payer: Self-pay | Admitting: Neurology

## 2023-06-26 ENCOUNTER — Ambulatory Visit: Admitting: Physical Therapy

## 2023-06-26 DIAGNOSIS — R278 Other lack of coordination: Secondary | ICD-10-CM

## 2023-06-26 DIAGNOSIS — R41840 Attention and concentration deficit: Secondary | ICD-10-CM | POA: Diagnosis not present

## 2023-06-26 DIAGNOSIS — M6281 Muscle weakness (generalized): Secondary | ICD-10-CM | POA: Diagnosis not present

## 2023-06-26 DIAGNOSIS — R2681 Unsteadiness on feet: Secondary | ICD-10-CM

## 2023-06-26 DIAGNOSIS — R41844 Frontal lobe and executive function deficit: Secondary | ICD-10-CM | POA: Diagnosis not present

## 2023-06-26 DIAGNOSIS — R29818 Other symptoms and signs involving the nervous system: Secondary | ICD-10-CM | POA: Diagnosis not present

## 2023-06-26 DIAGNOSIS — I69351 Hemiplegia and hemiparesis following cerebral infarction affecting right dominant side: Secondary | ICD-10-CM | POA: Diagnosis not present

## 2023-06-26 DIAGNOSIS — R41842 Visuospatial deficit: Secondary | ICD-10-CM | POA: Diagnosis not present

## 2023-06-26 DIAGNOSIS — R2689 Other abnormalities of gait and mobility: Secondary | ICD-10-CM | POA: Diagnosis not present

## 2023-06-26 MED ORDER — STUDY - LIBREXIA-STROKE - MILVEXIAN 25 MG OR PLACEBO TABLET (PI-SETHI): 1.0000 | ORAL_TABLET | Freq: Two times a day (BID) | ORAL | 0 refills | Status: DC

## 2023-06-27 ENCOUNTER — Ambulatory Visit: Admitting: Occupational Therapy

## 2023-06-27 ENCOUNTER — Encounter: Payer: Self-pay | Admitting: Physical Therapy

## 2023-06-27 DIAGNOSIS — R41842 Visuospatial deficit: Secondary | ICD-10-CM | POA: Diagnosis not present

## 2023-06-27 DIAGNOSIS — R41840 Attention and concentration deficit: Secondary | ICD-10-CM | POA: Diagnosis not present

## 2023-06-27 DIAGNOSIS — M25531 Pain in right wrist: Secondary | ICD-10-CM

## 2023-06-27 DIAGNOSIS — I69351 Hemiplegia and hemiparesis following cerebral infarction affecting right dominant side: Secondary | ICD-10-CM | POA: Diagnosis not present

## 2023-06-27 DIAGNOSIS — R278 Other lack of coordination: Secondary | ICD-10-CM

## 2023-06-27 DIAGNOSIS — R2689 Other abnormalities of gait and mobility: Secondary | ICD-10-CM | POA: Diagnosis not present

## 2023-06-27 DIAGNOSIS — R41844 Frontal lobe and executive function deficit: Secondary | ICD-10-CM | POA: Diagnosis not present

## 2023-06-27 DIAGNOSIS — M6281 Muscle weakness (generalized): Secondary | ICD-10-CM

## 2023-06-27 DIAGNOSIS — R29818 Other symptoms and signs involving the nervous system: Secondary | ICD-10-CM | POA: Diagnosis not present

## 2023-06-27 DIAGNOSIS — R2681 Unsteadiness on feet: Secondary | ICD-10-CM | POA: Diagnosis not present

## 2023-06-27 NOTE — Therapy (Signed)
 OUTPATIENT OCCUPATIONAL THERAPY NEURO EVALUATION  Patient Name: Jacqueline Bonilla MRN: [id number] DOB:07/03/1955, 68 y.o., female Today's Date: 06/27/2023  PCP: Purcell Emil Schanz, MD REFERRING PROVIDER: Carilyn Prentice BRAVO, MD  END OF SESSION:  OT End of Session - 06/27/23 0853     Visit Number 1    Number of Visits 16    Date for OT Re-Evaluation 08/30/23    Authorization Type [employer] [facility] 2025    Authorization Time Period Met VL    OT Start Time 0850    OT Stop Time 0930    OT Time Calculation (min) 40 min    Equipment Utilized During Treatment Testing Material    Activity Tolerance Patient tolerated treatment well    Behavior During Therapy [facility] for tasks assessed/performed          Past Medical History:  Diagnosis Date   Anemia    Anxiety    Depression    Dysmenorrhea    Endometriosis    Fibroid    Hypertension    Implantable loop recorder present 2021   Microhematuria    negative workup   Osteoporosis    Tachycardia    Thrombocytopenia (HCC)    Past Surgical History:  Procedure Laterality Date   BREAST BIOPSY     BUBBLE STUDY  12/08/2020   Procedure: BUBBLE STUDY;  Surgeon: Delford Maude BROCKS, MD;  Location: [facility] ENDOSCOPY;  Service: Cardiovascular;;   CESAREAN SECTION     hysteroscopic resection     implantable loop recorder implant  10/21/2019   Medtronic Reveal Linq model LNQ 22 ([place] [id number]) implantable loop recorder   IR CT HEAD LTD  12/01/2020   IR PERCUTANEOUS ART THROMBECTOMY/INFUSION INTRACRANIAL INC DIAG ANGIO  12/01/2020   IR US  GUIDE VASC ACCESS RIGHT  12/01/2020   ORIF HUMERUS FRACTURE Left 07/31/2021   Procedure: OPEN REDUCTION INTERNAL FIXATION (ORIF) DISTAL HUMERUS FRACTURE;  Surgeon: Kendal Franky SHAUNNA, MD;  Location: [facility] OR;  Service: Orthopedics;  Laterality: Left;   RADIOLOGY WITH ANESTHESIA N/A 12/01/2020   Procedure: IR WITH ANESTHESIA - CODE STROKE;  Surgeon: Radiologist, Medication, MD;  Location: [facility] OR;  Service: Radiology;  Laterality:  N/A;   TEE WITHOUT CARDIOVERSION N/A 12/08/2020   Procedure: TRANSESOPHAGEAL ECHOCARDIOGRAM (TEE);  Surgeon: Delford Maude BROCKS, MD;  Location: [facility] ENDOSCOPY;  Service: Cardiovascular;  Laterality: N/A;   Patient Active Problem List   Diagnosis Date Noted   Diarrhea 05/24/2023   Gait disturbance, 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   Hemiparesis affecting right side as late effect of cerebrovascular accident (CVA) (HCC) 04/23/2023   Apraxia, 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   Frequent falls 02/11/2023   Crushing injury of right hip 02/11/2023   Statin intolerance 04/24/2022   Internal hemorrhoids 01/10/2021   Dyslipidemia 12/29/2020   History of CVA (cerebrovascular accident) 07/14/2020   Normocytic anemia 07/11/2020   Vascular dementia without behavioral disturbance (HCC) 07/15/2019   History of COVID-19 04/22/2019   Essential hypertension 03/03/2019    ONSET DATE: 06/06/2023  REFERRING DIAG: Carilyn Prentice BRAVO, MD  THERAPY DIAG:  Other lack of coordination  Muscle weakness (generalized)  Visuospatial deficit  Attention and concentration deficit  Frontal lobe and executive function deficit  Other symptoms and signs involving the nervous system  Pain in right wrist  Rationale for Evaluation and Treatment: Rehabilitation  SUBJECTIVE:   SUBJECTIVE STATEMENT: Pt and spouse report that pt has great difficulty with getting clothes on and off without  crying episodes due to the struggle with the task.  Also note substantial visual change with this last stroke.  Spouse reports transitions torment her ie) between doors etc.  Pt accompanied by: significant other - [name]  PERTINENT HISTORY:   History of stroke History of seizure Hypertension Hyperlipidemia Substance use-marijuana Vascular dementia h/o loop recorder implant Oct. 2021; h/o Lt humerus fracture July 2023  PMHx: anxiety/depression,  hypertension, thrombocytopenia, left MCA infarction December 2022 as well as mechanical thrombectomy after receiving TNK for left M2/M3 occlusion complicated by minor subarachnoid hemorrhage. She did have a loop recorder placed at that time and maintained on aspirin . She was taken off Brilinta  in February 2025.  Presented 03/27/2023 with aphasia altered mental status and unstable gait with frequent falls. CT/MRI showed small acute left MCA infarction. Severe chronic small vessel ischemic disease and cerebral atrophy. CTA showed no large vessel occlusion. She did receive TNK. Admission chemistries unremarkable urine drug screen positive marijuana. Echocardiogram with ejection fraction of 60 to 65% no wall motion abnormalities grade 1 diastolic dysfunction. Neurology follow-up maintained on aspirin  as well as Brilinta  90 mg twice daily x 4 weeks then aspirin  alone with study drug followed by neurology services.   PRECAUTIONS: Fall  WEIGHT BEARING RESTRICTIONS: No  PAIN:  Are you having pain? Yes: NPRS scale: 5 (constant) Pain location: Right wrist  Pain description: stabbing Aggravating factors: moving it Relieving factors: resting, - e-stim/tens daily 1-2 times/day  No use of brace  FALLS: Has patient fallen in last 6 months? Yes. Number of falls 12 in past 6 months but only near falls in the last month  LIVING ENVIRONMENT: Lives with: lives with their spouse Lives in: House Stairs: Yes: External: 3 steps; can reach both Has following equipment at home: Single point cane and Grab bars Walk in shower with 4x5 inch step to get in with vertical and horizontal bars   Hobbies: Played keyboard/piano but not recently ie) can't read read music now.  Spouse reports she doesn't remember she knows sign language and    PLOF: Independent with basic ADLs, Independent with household mobility without device, Independent with community mobility without device, and Needs assistance with homemaking    PATIENT GOALS: Increased ease with dressing and self care  OBJECTIVE:  Note: Objective measures were completed at Evaluation unless otherwise noted.  HAND DOMINANCE: Right  ADLs: Overall ADLs: Assistance required with all tasks due to frustration and emotional lability Transfers/ambulation related to ADLs: Supervision Eating: Pt can't cut her food by herself Grooming: Pt reports she puts a visor on instead of combing UB Dressing: Have to have help with both bra and shirt LB Dressing: Needs help with this, doesn't wear socks, uses slip on shoes Toileting: Assistance for cleaning s/p bowel movements, Spouse reports she is good with managing urination - she has a light that comes on when she gets up to go to the bathroom at night Bathing: Pt has shower seat, sister in law and caregiver 1-2x/week for help  Tub Shower transfers: Supervision Equipment: Shower seat with back, Grab bars, and Walk in shower  IADLs: Shopping: Goes out with husband but very little due to limited stamina Light housekeeping: Pt helps with emptying dishwasher ie) utensils, and folding laundry Meal Prep: NA - Dep Community mobility: Supervision Medication management: 2 meds  Financial management: Spouse Handwriting: TBA  MOBILITY STATUS: Needs Assist: Supervision and recent h/o cane  POSTURE COMMENTS:  rounded shoulders and forward head Sitting balance: WFL  ACTIVITY TOLERANCE: Activity tolerance:  Limited for shopping etc ie) needed help to get in car after going to [employer] recently  FUNCTIONAL OUTCOME MEASURES: Eval 06/27/23: Modified Barthel 62/100  UPPER EXTREMITY ROM:    Active ROM Right eval Left eval  Shoulder flexion [facility]  Shoulder abduction    Shoulder adduction    Shoulder extension    Shoulder internal rotation    Shoulder external rotation    Elbow flexion    Elbow extension    Wrist flexion Limited   Wrist extension Poor   Wrist ulnar deviation    Wrist radial deviation     Wrist pronation    Wrist supination    (Blank rows = not tested)  UPPER EXTREMITY MMT:     MMT Right eval Left eval  Shoulder flexion 3 4  Shoulder abduction    Shoulder adduction    Shoulder extension    Shoulder internal rotation    Shoulder external rotation    Middle trapezius    Lower trapezius    Elbow flexion 3 4  Elbow extension 3 4  Wrist flexion    Wrist extension    Wrist ulnar deviation    Wrist radial deviation    Wrist pronation    Wrist supination    (Blank rows = not tested)  HAND FUNCTION: Grip strength: Right: 9.0, 8.5, 9.0  lbs; Left: 23.3, 24.6, 24.6 lbs Eval 06/27/23 Average Right: 8.8 lbs Left 24.0 lbs  COORDINATION:  9 Hole Peg test: Right: 3 pegs in 2 mins sec; Left: 1:28.67 sec Left - very deliberate placement of pegs  SENSATION: WFL by pt reports  EDEMA: R wrist d/t h/o fracture   MUSCLE TONE: ataxic  COGNITION: Overall cognitive status: Impaired  VISION: Subjective report: Pt reports she can't see from Right eye (?) might mean she has a hard time seeing on the right side Baseline vision: Bifocals Visual history: NA   VISION ASSESSMENT: Impaired To be further assessed in functional context Tracking/Visual pursuits: Requires cues, head turns, or add eye shifts to track Pt tracks to her L without head turn but unable to track to the R without head movement  Difficulty tracking circularly - even on L side Spouse reports the Upper right quadrant is where things disappear  Patient has difficulty with following activities due to following visual impairments: just everything  OBSERVATIONS: Pt ambulates with no AE today with no obvious loss of balance but slow ambulation and . The pt has visor donned to pull her hair away from her face.                                                                                                                            TODAY'S TREATMENT:    OT educated pt on rehabilitation process and  results of objective measures in relation to pt specific goals for OT POC.  Initiated education to assist with hemi dressing techniques ie) dress R arm first and ensure sleeves are  past her elbows before trying to lift shirt over her head.  PATIENT EDUCATION: Education details: OT role, POC considerations and hemi dressing  Person educated: Patient and Spouse Education method: Explanation, Demonstration, and Verbal cues Education comprehension: verbalized understanding, verbal cues required, and needs further education  HOME EXERCISE PROGRAM: TBD   GOALS: Goals reviewed with patient? Yes  SHORT TERM GOALS: Target date: 07/26/23  Patient will demonstrate initial R UE HEP and visual HEP as needed with 25% verbal cues or less for proper execution. Baseline: New to outpt OT Goal status: INITIAL    2.  Pt/caregivers will verbalize understanding of adapted strategies and/or equipment PRN to increase safety and independence with ADLs and IADLs (I.e. with decreased frustration/emotional lability) including   - Don T-shirt with setup assistance  - Don pants with setup assistance  - Bathe with visual/verbal cues  - Conduct grooming tasks with visual/verbal cues             Baseline: New to outpt OT             Goal Status: INITIAL  3. Patient will demonstrate at least 75% accuracy with environmental scanning to assist with ADLs and IADLS including clothing selection and transitions ie) through doors etc.  Baseline: New to outpt OT  Goal Status: INITIAL      LONG TERM GOALS: Target date: 08/30/23   Patient will demonstrate updated RUE HEP (including using prior awareness of sign language) with visual handouts only for proper execution.  Baseline: New to outpt OT  Goal status: INITIAL  2.  Patient will demonstrate at least 10+ lbs R UE grip strength as needed to help with ADLs. Baseline: Average Right: 8.8 lbs Left 24.0 lbs Goal status: INITIAL   3.  Patient will demo improved FM  coordination as evidenced by completing nine-hole peg with use of R UE in 5 minutes to print her name.  Baseline: Right: 3 pegs in 2 mins sec; Left: 1:28.67 sec Goal status: INITIAL    4.  Pt will report increased ease with ADLs (Modified Barthel >70/100) with decreased assistance of caregiver  Baseline: Modified Barthel 62/100 Goal status: INITIAL  ASSESSMENT:  CLINICAL IMPRESSION: Patient is a 67 y.o. female who was seen today for occupational therapy evaluation for R UE dysfunction s/p CVA and h/o wrist fracture. Hx includes HTN, stroke, vascular dementia, frequent falls. Patient currently presents below baseline level of function, requiring constant assistance form caregivers with ADLs due to functional deficits and impairments in strength, coordination, vision, emotions and balance. Pt would benefit from skilled OT services in the outpatient setting to work on impairments as noted below to help pt return to PLOF as able.    PERFORMANCE DEFICITS: in functional skills including ADLs, IADLs, coordination, dexterity, proprioception, sensation, edema, tone, ROM, strength, pain, fascial restrictions, muscle spasms, flexibility, Fine motor control, Gross motor control, mobility, balance, body mechanics, endurance, decreased knowledge of precautions, decreased knowledge of use of DME, vision, and UE functional use, cognitive skills including attention, emotional, energy/drive, learn, memory, problem solving, safety awareness, sequencing, temperament/personality, thought, and understand, and psychosocial skills including coping strategies, environmental adaptation, habits, interpersonal interactions, and routines and behaviors.   IMPAIRMENTS: are limiting patient from ADLs, IADLs, rest and sleep, leisure, and social participation.   CO-MORBIDITIES: has co-morbidities such as vascular dementia and previous stroke that affects occupational performance. Patient will benefit from skilled OT to address above  impairments and improve overall function.  MODIFICATION OR ASSISTANCE TO COMPLETE EVALUATION: Min-Moderate  modification of tasks or assist with assess necessary to complete an evaluation.  OT OCCUPATIONAL PROFILE AND HISTORY: Detailed assessment: Review of records and additional review of physical, cognitive, psychosocial history related to current functional performance.  CLINICAL DECISION MAKING: Moderate - several treatment options, min-mod task modification necessary  REHAB POTENTIAL: Fair due to complexity of issues  EVALUATION COMPLEXITY: Moderate    PLAN:  OT FREQUENCY: 1-2x/week  OT DURATION: 8 weeks -   PLANNED INTERVENTIONS: 02831 OT Re-evaluation, 97535 self care/ADL training, 02889 therapeutic exercise, 97530 therapeutic activity, 97112 neuromuscular re-education, 97140 manual therapy, 97035 ultrasound, 97018 paraffin, 02960 fluidotherapy, 97010 moist heat, 97010 cryotherapy, 97760 Orthotic Initial, 97763 Orthotic/Prosthetic subsequent, passive range of motion, balance training, functional mobility training, visual/perceptual remediation/compensation, psychosocial skills training, energy conservation, coping strategies training, patient/family education, and DME and/or AE instructions  RECOMMENDED OTHER SERVICES: NA  CONSULTED AND AGREED WITH PLAN OF CARE: Patient and family member/caregiver  PLAN FOR NEXT SESSIONS:  Practice hemi-dressing techniques HEPs - ROM, coordination, strength and vision More detailed vision ass't Modalities for R wrist pain   Clarita LITTIE Pride, OT 06/27/2023, 8:18 PM

## 2023-06-27 NOTE — Therapy (Addendum)
 OUTPATIENT PHYSICAL THERAPY NEURO/AQUATIC TREATMENT NOTE/Discharge summary (completed 07-02-23)   PHYSICAL THERAPY DISCHARGE SUMMARY  Visits from Start of Care: 8  Current functional level related to goals / functional outcomes: Unable to assess LTG's due to change in medical status with pt requiring hospitalization on 06-30-23.   Remaining deficits: Unable to assess - pt was ambulating without device with SBA on 06-26-23 at previous aquatic PT session.   Education / Equipment: Pt and husband had been instructed in HEP consisting of balance and RLE coordination exercises.   Patient agrees to discharge. Patient goals were not met. Patient is being discharged due to a change in medical status.  Pt had appt on 06-30-33 for OP PT - was cancelled due to current hospitalization.   The below note was addended to include discharge from OP PT due to change in medical status/ hospitalization due to TIA per chart note.   Thank you for the referral of this patient, Jacqueline Bonilla, PT    Patient Name: Jacqueline Bonilla MRN: [id number] DOB:11/19/1955, 68 y.o., female Today's Date: 06/27/2023   PCP: Jacqueline Emil Schanz, Bonilla REFERRING PROVIDER: Carilyn Prentice BRAVO, Bonilla  END OF SESSION:  PT End of Session - 06/27/23 1943     Visit Number 8    Number of Visits 17   combination of land/aquatic visits   Date for PT Re-Evaluation 08/02/23   pushed out 1 week due to scheduling   Authorization Type [employer]    Authorization Time Period 06-03-23 - 08-03-23    Authorization - Visit Number 8    Authorization - Number of Visits 17    Progress Note Due on Visit 10    PT Start Time 1400    PT Stop Time 1445    PT Time Calculation (min) 45 min    Equipment Utilized During Treatment Other (comment)   barbells, noodle   Activity Tolerance Patient tolerated treatment well    Behavior During Therapy [facility] for tasks assessed/performed            Past Medical History:  Diagnosis Date   Anemia     Anxiety    Depression    Dysmenorrhea    Endometriosis    Fibroid    Hypertension    Implantable loop recorder present 2021   Microhematuria    negative workup   Osteoporosis    Tachycardia    Thrombocytopenia (HCC)    Past Surgical History:  Procedure Laterality Date   BREAST BIOPSY     BUBBLE STUDY  12/08/2020   Procedure: BUBBLE STUDY;  Surgeon: Jacqueline Maude BROCKS, Bonilla;  Location: [facility];  Service: Cardiovascular;;   CESAREAN SECTION     hysteroscopic resection     implantable loop recorder implant  10/21/2019   Medtronic Reveal Linq model LNQ 22 (Jacqueline Bonilla) implantable loop recorder   IR CT HEAD LTD  12/01/2020   IR PERCUTANEOUS ART THROMBECTOMY/INFUSION INTRACRANIAL INC DIAG ANGIO  12/01/2020   IR US  GUIDE VASC ACCESS RIGHT  12/01/2020   ORIF HUMERUS FRACTURE Left 07/31/2021   Procedure: OPEN REDUCTION INTERNAL FIXATION (ORIF) DISTAL HUMERUS FRACTURE;  Surgeon: Jacqueline Franky SHAUNNA, Bonilla;  Location: [facility] OR;  Service: Orthopedics;  Laterality: Left;   RADIOLOGY WITH ANESTHESIA N/A 12/01/2020   Procedure: IR WITH ANESTHESIA - CODE STROKE;  Surgeon: Jacqueline Bonilla;  Location: [facility] OR;  Service: Radiology;  Laterality: N/A;   TEE WITHOUT CARDIOVERSION N/A 12/08/2020   Procedure: TRANSESOPHAGEAL ECHOCARDIOGRAM (TEE);  Surgeon: Jacqueline Maude BROCKS, Bonilla;  Location: [facility] ENDOSCOPY;  Service: Cardiovascular;  Laterality: N/A;   Patient Active Problem List   Diagnosis Date Noted   Diarrhea 05/24/2023   Gait disturbance, 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   Hemiparesis affecting right side as late effect of cerebrovascular accident (CVA) (HCC) 04/23/2023   Apraxia, 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   Frequent falls 02/11/2023   Crushing injury of right hip 02/11/2023   Statin intolerance 04/24/2022   Internal hemorrhoids 01/10/2021   Dyslipidemia 12/29/2020   History of CVA (cerebrovascular accident) 07/14/2020    Normocytic anemia 07/11/2020   Vascular dementia without behavioral disturbance (HCC) 07/15/2019   History of COVID-19 04/22/2019   Essential hypertension 03/03/2019    ONSET DATE: 03-27-23 (Lt MCA CVA)  REFERRING DIAG: I69.398,R26.9 (ICD-10-CM) - Gait disturbance, post-stroke I69.351 (ICD-10-CM) - Hemiparesis affecting right side as late effect of cerebrovascular accident (CVA) (HCC)  THERAPY DIAG:  Other abnormalities of gait and mobility  Unsteadiness on feet  Other lack of coordination  Rationale for Evaluation and Treatment: Rehabilitation  SUBJECTIVE:                                                                                                                                                                                             SUBJECTIVE STATEMENT: Pt ambulating to pool area without use of SPC today - accompanied by her husband.  Pt reports she is tired because she went to the [facility] and worked out earlier today.  Pt accompanied by: spouse, Jacqueline Bonilla  PERTINENT HISTORY:  Per chart note 04-01-23 Jacqueline Bonilla is a 68 y.o. 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  She did have a loop recorder placed at that time and maintained on aspirin .  She was taken off Brilinta  in February 2025.  Patient received inpatient rehab services 12/08/2020 - 12/17/2020 and was discharged to home contact-guard assist for ambulation.  Per chart review she lives with spouse.  1 level home 5 steps to entry.  Presented 03/27/2023 with aphasia altered mental status and unstable gait with frequent falls.  CT/MRI showed small acute left MCA infarction.  Severe chronic small vessel ischemic disease and cerebral atrophy.  CTA showed no large vessel occlusion.  She did receive TNK.  Admission  chemistries unremarkable urine drug screen positive marijuana.  Echocardiogram with ejection fraction of 60 to 65% no wall motion abnormalities grade 1 diastolic dysfunction.  Neurology follow-up maintained on aspirin  as well as Brilinta   90 mg twice daily x 4 weeks then aspirin  alone with study drug followed by neurology services.  Tolerating a regular consistency diet.  Therapy evaluations completed due to patient decreased functional mobility was admitted for a comprehensive rehab program.  Secondary diagnosis History of stroke History of seizure Hypertension Hyperlipidemia Substance use-marijuana Vascular dementia  h/o loop recorder implant Oct. 2021; h/o Lt humerus fracture July 2023    PAIN:  Are you having pain? Yes: NPRS scale: 2-3/10 Pain location: Rt wrist  Pain description: sore, discomfort Aggravating factors: moving it Relieving factors: at rest  - not moving it *will request order for OT from Dr. Carilyn - PT will not address wrist pain as out of scope of referral  PRECAUTIONS: Other: No driving  RED FLAGS: None   WEIGHT BEARING RESTRICTIONS: No  FALLS: Has patient fallen in last 6 months? Yes. Number of falls 12+ - some due to dizzy spells - due to change in medication (unsure if this is what caused stroke, per stroke)  LIVING ENVIRONMENT: Lives with: lives with their spouse Lives in: House/apartment Stairs: Yes: External: 3 steps; can reach both Has following equipment at home: Single point cane and Grab bars  PLOF: Independent with basic ADLs, Independent with household mobility without device, Independent with community mobility without device, and Needs assistance with homemaking  PATIENT GOALS: improve walking and balance  OBJECTIVE:  Note: Objective measures were completed at Evaluation unless otherwise noted.  DIAGNOSTIC FINDINGS: IMPRESSION:MRI Brain without contrast 03-28-23 Small acute infarct in the posterior left frontal cortex. Some of the  diffusion hyperintensity on yesterday's scan was reversible.   Advanced, widespread chronic ischemic injury with atrophy.    COGNITION: Overall cognitive status: Impaired and pt has vascular dementia per chart notes   SENSATION: WFL  COORDINATION: RLE WFL's; pt demonstrates festinating gait upon initial sit to stand with step initiation for gait  POSTURE: rounded shoulders and forward head  LOWER EXTREMITY ROM:   WFL's bil. LE's  LOWER EXTREMITY MMT:  LLE WNL's  MMT Right Eval Left Eval  Hip flexion 3+   Hip extension    Hip abduction    Hip adduction    Hip internal rotation    Hip external rotation    Knee flexion 4   Knee extension 5   Ankle dorsiflexion 4   Ankle plantarflexion    Ankle inversion    Ankle eversion    (Blank rows = not tested)  BED MOBILITY:  Findings: Independent  TRANSFERS: Sit to stand: Modified independence  Assistive device utilized: Single point cane     Pt able to perform sit to stand from mat without UE support  RAMP:  Not tested  CURB:  Not tested  STAIRS: Findings: Level of Assistance: SBA, Stair Negotiation Technique: Step to Pattern with Bilateral Rails, Number of Stairs: 4, Height of Stairs: 6   , and Comments: husband reports pt does not clear heel of Rt foot with step descension at their home GAIT: Findings: Gait Characteristics: pt demonstrates festinating gait for initial steps then amb. with, step through pattern, decreased arm swing- Right, lateral lean- Right, and decreased trunk rotation, Distance walked: 100', Assistive device utilized:Single point cane, Level of assistance: CGA, and Comments: festinating gait for initial steps when initiating ambulation; pt carried SPC from lobby to clinic gym - did not use consistently   FUNCTIONAL TESTS:  5 times sit to stand: 14.79 secs without UE support Timed up and go (TUG): 15.75 secs without device 10 meter  walk test: 12.82 secs without device = 2.56 ft/sec     [facility] PT  Assessment - 06/03/23 0001                Berg Balance Test    Sit to Stand Able to stand without using hands and stabilize independently     Standing Unsupported Able to stand 2 minutes with supervision     Sitting with Back Unsupported but Feet Supported on Floor or Stool Able to sit safely and securely 2 minutes     Stand to Sit Sits safely with minimal use of hands     Transfers Able to transfer safely, minor use of hands     Standing Unsupported with Eyes Closed Unable to keep eyes closed 3 seconds but stays steady     Standing Unsupported with Feet Together Able to place feet together independently but unable to hold for 30 seconds                 Berg not finished due to time constraint and pt's c/o fatigue - pt very labile after completing above Berg balance assessment                                                                                                                             TREATMENT DATE: 06-26-23 Aquatic PT at [facility] - pool temp. 90 degrees  Patient seen for aquatic therapy today.  Treatment took place in water 3.6-4.0 feet deep depending upon activity.  Pt entered and exited the pool via step negotiation with use of bil. Hand rails with SBA.  Warm up; forwards ambulation without floatation device 18' x 4 reps;  sideways amb. 18' x 2 reps, backwards amb. 18' x 2 reps  Marching in place 10 reps each leg x 2 sets  Alternate stepping forward/back, out/in, and back/forward 5 reps each direction each leg holding onto yellow barbells for assist with floatation and support  Marching forwards across pool 18' x 2 reps holding large barbells  Standing balance exercises - alternating forward kicks, side kicks, backward kicks 10 reps each direction each leg  Crossovers 18' x 2 reps with bil. UE support on PT's forearms; stepping behind 18' x 2 reps with bil. UE support on PT's forearms   Plyometrics - 1/2 jumping jacks (legs only) 10 reps -  holding small bar bells Lunges - 10 reps - with visual cues - pt able to perform this movement today with moderate difficulty initially, but less difficulty with repetition  Pt requires buoyancy of water for support for reduced fall risk with standing balance exercises and with SLS activities; pt able to tolerate increased standing and ambulation in water compared to that on land; viscosity of water is needed for resistance for strengthening and current of water provides perturbations for challenge for balance training          HEP: Balance HEP Access Code: [email or web address] URL: [email or web address] Date: 06/07/2023  Prepared by: Rock Bonilla  Exercises - Standing Hip Flexion with Counter Support  - 1 x daily - 7 x weekly - 1 sets - 10 reps - Standing Hip Abduction with Counter Support  - 1 x daily - 7 x weekly - 1 sets - 10 reps - Standing March with Counter Support  - 1 x daily - 7 x weekly - 1 sets - 10 reps - Standing Hip Extension with Counter Support  - 1 x daily - 7 x weekly - 1 sets - 10 reps  Also - added touching 2 targets (paper plate or solo cup) in standing with assist of husband or standing at counter; 5 reps each leg - 1x/day       PATIENT EDUCATION: Education details: reviewed balance HEP issued as pt is currently not performing these exercises as part of her routine per husband's report - is doing more exercises in seated position-- EMPHASIZED NEED TO DO THE STANDING BALANCE HEP AS ISSUED ON 06-07-23 Person educated: Patient and Spouse Education method: Medical illustrator Education comprehension: verbalized understanding  HOME EXERCISE PROGRAM: To be established   GOALS: Goals reviewed with patient? Yes  SHORT TERM GOALS: Target date: 06-28-23  Pt will increase Berg balance test score by at least 4 points to demo improved balance & reduced fall risk.  Baseline: Goal status: INITIAL  2.  Improve TUG score to </= 13.5 secs without device  for reduced fall risk. Baseline: 15.75 secs without device Goal status: INITIAL  3.  Increase gait velocity to >/= 2.9 ft/sec without device for increased gait efficiency. Baseline: 2.56 ft/sec without device Goal status: INITIAL  4.  Pt will ambulate 350' on flat, even surface with SBA with minimal festination of gait with initiation of steps. Baseline: moderate to severe festination with initiation during eval on 06-03-23 Goal status: INITIAL  5.  Improve DGA by at least 4 points to demo increased safety with ambulation. Baseline:  to be assessed Goal status: INITIAL  6.  Caregiver (husband) to assist pt with land HEP consisting of balance and strengthening exercises. Baseline:  Goal status: INITIAL  LONG TERM GOALS: Target date: 08-02-23  Pt will increase Berg balance test score by at least 8 points to demo improved balance & reduced fall risk.  Baseline:  Goal status: INITIAL  2.  Pt will amb. 500' without device with SBA on even and uneven surfaces with minimal gait deviations exhibited and no LOB.   Baseline:  Goal status: INITIAL  3.  Increase DGI score by at least 8 points to demo improved balance and safety with ambulation.  Baseline: TBA Goal status: INITIAL  4.  Perform floor to stand transfer with UE support on object with supervision.  Baseline:  Goal status: INITIAL  5.  Husband will report pt's ability to negotiate steps at their home with use of rails without Rt heel catching edge of step. Baseline:  Goal status: INITIAL  6.  Husband to assist pt in performing updated HEP for land and aquatic based exercises for water walking, balance and strengthening.  Baseline:  Goal status: INITIAL  ASSESSMENT:  CLINICAL IMPRESSION: Aquatic PT session focused on water walking with cues for increased step length and Rt arm swing.  Pt noted to have improved coordination with exercises incorporating contralateral UE and LE movement.  Pt also able to perform lunges in  today's session, whereas, she was unable to coordinate and perform this activity in previous aquatic session 2 weeks ago.  Pt's balance with water walking improved in today's session with less assistance required.    Pt continues to have Rt lateral trunk lean but able to stand more upright with tactile cues for Rt trunk elongation.  Cont with POC.    OBJECTIVE IMPAIRMENTS: Abnormal gait, decreased activity tolerance, decreased balance, decreased cognition, decreased endurance, decreased strength, impaired tone, and impaired UE functional use.   ACTIVITY LIMITATIONS: carrying, lifting, bending, stairs, bathing, toileting, hygiene/grooming, and locomotion level  PARTICIPATION LIMITATIONS: meal prep, cleaning, laundry, driving, shopping, and community activity  PERSONAL FACTORS: Age, Behavior pattern, Past/current experiences, and 1-2 comorbidities: h/o previous CVA's and h/o vascular dementia are also affecting patient's functional outcome.   REHAB POTENTIAL: Good  CLINICAL DECISION MAKING: Evolving/moderate complexity  EVALUATION COMPLEXITY: Moderate  PLAN:  PT FREQUENCY: 2x/week  PT DURATION: 8 weeks + eval   PLANNED INTERVENTIONS: 97110-Therapeutic exercises, 97530- Therapeutic activity, 97112- Neuromuscular re-education, [phone]- Self Care, [id number]- Gait training, [phone]- Aquatic Therapy, and Patient/Family education  PLAN FOR NEXT SESSION: check STG's LAND:  gait training without device - pt carries SPC but does not consistently use correctly; balance training - anything to improve SLS and coordination RLE- blaze pods, targets, etc.; core stabilization - (pt noted to have Rt lateral trunk flexion/lean)   Aquatics; water walking, balance exercises with gradual decrease in UE support  Kynesha Guerin, [place], PT 06/27/2023, 7:47 PM

## 2023-06-30 ENCOUNTER — Emergency Department (HOSPITAL_COMMUNITY)

## 2023-06-30 ENCOUNTER — Encounter (HOSPITAL_COMMUNITY): Payer: Self-pay | Admitting: Emergency Medicine

## 2023-06-30 ENCOUNTER — Observation Stay (HOSPITAL_COMMUNITY)

## 2023-06-30 ENCOUNTER — Inpatient Hospital Stay (HOSPITAL_COMMUNITY)
Admission: EM | Admit: 2023-06-30 | Discharge: 2023-07-02 | DRG: 101 | Disposition: A | Discharge status: Skilled Nursing Facility | Attending: Family Medicine | Admitting: Family Medicine

## 2023-06-30 ENCOUNTER — Other Ambulatory Visit: Payer: Self-pay

## 2023-06-30 DIAGNOSIS — G459 Transient cerebral ischemic attack, unspecified: Secondary | ICD-10-CM | POA: Diagnosis not present

## 2023-06-30 DIAGNOSIS — Z91041 Radiographic dye allergy status: Secondary | ICD-10-CM

## 2023-06-30 DIAGNOSIS — R29818 Other symptoms and signs involving the nervous system: Secondary | ICD-10-CM | POA: Diagnosis not present

## 2023-06-30 DIAGNOSIS — R9401 Abnormal electroencephalogram [EEG]: Secondary | ICD-10-CM | POA: Diagnosis present

## 2023-06-30 DIAGNOSIS — F0154 Vascular dementia, unspecified severity, with anxiety: Secondary | ICD-10-CM | POA: Diagnosis present

## 2023-06-30 DIAGNOSIS — G319 Degenerative disease of nervous system, unspecified: Secondary | ICD-10-CM | POA: Diagnosis not present

## 2023-06-30 DIAGNOSIS — G4730 Sleep apnea, unspecified: Secondary | ICD-10-CM | POA: Diagnosis present

## 2023-06-30 DIAGNOSIS — Z8249 Family history of ischemic heart disease and other diseases of the circulatory system: Secondary | ICD-10-CM

## 2023-06-30 DIAGNOSIS — R2681 Unsteadiness on feet: Secondary | ICD-10-CM | POA: Diagnosis present

## 2023-06-30 DIAGNOSIS — Z91013 Allergy to seafood: Secondary | ICD-10-CM

## 2023-06-30 DIAGNOSIS — F0390 Unspecified dementia without behavioral disturbance: Secondary | ICD-10-CM | POA: Diagnosis not present

## 2023-06-30 DIAGNOSIS — F0150 Vascular dementia without behavioral disturbance: Secondary | ICD-10-CM | POA: Diagnosis not present

## 2023-06-30 DIAGNOSIS — R29810 Facial weakness: Secondary | ICD-10-CM | POA: Diagnosis present

## 2023-06-30 DIAGNOSIS — M810 Age-related osteoporosis without current pathological fracture: Secondary | ICD-10-CM | POA: Diagnosis present

## 2023-06-30 DIAGNOSIS — I69351 Hemiplegia and hemiparesis following cerebral infarction affecting right dominant side: Secondary | ICD-10-CM

## 2023-06-30 DIAGNOSIS — I6782 Cerebral ischemia: Secondary | ICD-10-CM | POA: Diagnosis not present

## 2023-06-30 DIAGNOSIS — R471 Dysarthria and anarthria: Secondary | ICD-10-CM | POA: Diagnosis not present

## 2023-06-30 DIAGNOSIS — Z888 Allergy status to other drugs, medicaments and biological substances status: Secondary | ICD-10-CM | POA: Diagnosis not present

## 2023-06-30 DIAGNOSIS — R270 Ataxia, unspecified: Secondary | ICD-10-CM | POA: Diagnosis present

## 2023-06-30 DIAGNOSIS — R29705 NIHSS score 5: Secondary | ICD-10-CM

## 2023-06-30 DIAGNOSIS — Z8673 Personal history of transient ischemic attack (TIA), and cerebral infarction without residual deficits: Secondary | ICD-10-CM | POA: Diagnosis not present

## 2023-06-30 DIAGNOSIS — I700 Atherosclerosis of aorta: Secondary | ICD-10-CM | POA: Diagnosis not present

## 2023-06-30 DIAGNOSIS — Z881 Allergy status to other antibiotic agents status: Secondary | ICD-10-CM | POA: Diagnosis not present

## 2023-06-30 DIAGNOSIS — I10 Essential (primary) hypertension: Secondary | ICD-10-CM | POA: Diagnosis present

## 2023-06-30 DIAGNOSIS — Z7902 Long term (current) use of antithrombotics/antiplatelets: Secondary | ICD-10-CM

## 2023-06-30 DIAGNOSIS — Z79899 Other long term (current) drug therapy: Secondary | ICD-10-CM | POA: Diagnosis not present

## 2023-06-30 DIAGNOSIS — F32A Depression, unspecified: Secondary | ICD-10-CM | POA: Diagnosis present

## 2023-06-30 DIAGNOSIS — I639 Cerebral infarction, unspecified: Secondary | ICD-10-CM | POA: Diagnosis present

## 2023-06-30 DIAGNOSIS — G4089 Other seizures: Principal | ICD-10-CM | POA: Diagnosis present

## 2023-06-30 DIAGNOSIS — R41842 Visuospatial deficit: Secondary | ICD-10-CM | POA: Diagnosis present

## 2023-06-30 DIAGNOSIS — R569 Unspecified convulsions: Secondary | ICD-10-CM | POA: Diagnosis present

## 2023-06-30 DIAGNOSIS — I480 Paroxysmal atrial fibrillation: Secondary | ICD-10-CM | POA: Diagnosis present

## 2023-06-30 DIAGNOSIS — R269 Unspecified abnormalities of gait and mobility: Secondary | ICD-10-CM | POA: Diagnosis not present

## 2023-06-30 DIAGNOSIS — G8191 Hemiplegia, unspecified affecting right dominant side: Secondary | ICD-10-CM | POA: Diagnosis present

## 2023-06-30 DIAGNOSIS — F0153 Vascular dementia, unspecified severity, with mood disturbance: Secondary | ICD-10-CM | POA: Diagnosis present

## 2023-06-30 DIAGNOSIS — Z7982 Long term (current) use of aspirin: Secondary | ICD-10-CM

## 2023-06-30 DIAGNOSIS — E785 Hyperlipidemia, unspecified: Secondary | ICD-10-CM | POA: Diagnosis present

## 2023-06-30 DIAGNOSIS — R296 Repeated falls: Secondary | ICD-10-CM | POA: Diagnosis present

## 2023-06-30 DIAGNOSIS — G458 Other transient cerebral ischemic attacks and related syndromes: Secondary | ICD-10-CM | POA: Diagnosis not present

## 2023-06-30 DIAGNOSIS — R531 Weakness: Secondary | ICD-10-CM | POA: Diagnosis not present

## 2023-06-30 DIAGNOSIS — E042 Nontoxic multinodular goiter: Secondary | ICD-10-CM | POA: Diagnosis not present

## 2023-06-30 DIAGNOSIS — Z7401 Bed confinement status: Secondary | ICD-10-CM | POA: Diagnosis not present

## 2023-06-30 DIAGNOSIS — D649 Anemia, unspecified: Secondary | ICD-10-CM | POA: Diagnosis present

## 2023-06-30 DIAGNOSIS — I69390 Apraxia following cerebral infarction: Secondary | ICD-10-CM | POA: Diagnosis not present

## 2023-06-30 DIAGNOSIS — I739 Peripheral vascular disease, unspecified: Secondary | ICD-10-CM | POA: Diagnosis not present

## 2023-06-30 LAB — DIFFERENTIAL
Abs Immature Granulocytes: 0.05 10*3/uL (ref 0.00–0.07)
Basophils Absolute: 0.1 10*3/uL (ref 0.0–0.1)
Basophils Relative: 1 %
Eosinophils Absolute: 0.1 10*3/uL (ref 0.0–0.5)
Eosinophils Relative: 1 %
Immature Granulocytes: 1 %
Lymphocytes Relative: 28 %
Lymphs Abs: 1.8 10*3/uL (ref 0.7–4.0)
Monocytes Absolute: 0.5 10*3/uL (ref 0.1–1.0)
Monocytes Relative: 7 %
Neutro Abs: 3.9 10*3/uL (ref 1.7–7.7)
Neutrophils Relative %: 62 %

## 2023-06-30 LAB — RAPID URINE DRUG SCREEN, HOSP PERFORMED
Amphetamines: NOT DETECTED
Barbiturates: NOT DETECTED
Benzodiazepines: NOT DETECTED
Cocaine: NOT DETECTED
Opiates: NOT DETECTED
Tetrahydrocannabinol: NOT DETECTED

## 2023-06-30 LAB — COMPREHENSIVE METABOLIC PANEL WITH GFR
ALT: 16 U/L (ref 0–44)
AST: 23 U/L (ref 15–41)
Albumin: 3.8 g/dL (ref 3.5–5.0)
Alkaline Phosphatase: 45 U/L (ref 38–126)
Anion gap: 11 (ref 5–15)
BUN: 22 mg/dL (ref 8–23)
CO2: 21 mmol/L — ABNORMAL LOW (ref 22–32)
Calcium: 9.5 mg/dL (ref 8.9–10.3)
Chloride: 106 mmol/L (ref 98–111)
Creatinine, Ser: 0.92 mg/dL (ref 0.44–1.00)
GFR, Estimated: >60 mL/min (ref >60)
Glucose, Bld: 135 mg/dL — ABNORMAL HIGH (ref 70–99)
Potassium: 4 mmol/L (ref 3.5–5.1)
Sodium: 138 mmol/L (ref 135–145)
Total Bilirubin: 0.4 mg/dL (ref 0.0–1.2)
Total Protein: 7.6 g/dL (ref 6.5–8.1)

## 2023-06-30 LAB — CBC
HCT: 38.6 % (ref 36.0–46.0)
Hemoglobin: 12.5 g/dL (ref 12.0–15.0)
MCH: 29.4 pg (ref 26.0–34.0)
MCHC: 32.4 g/dL (ref 30.0–36.0)
MCV: 90.8 fL (ref 80.0–100.0)
Platelets: 232 10*3/uL (ref 150–400)
RBC: 4.25 MIL/uL (ref 3.87–5.11)
RDW: 15.1 % (ref 11.5–15.5)
WBC: 6.3 10*3/uL (ref 4.0–10.5)
nRBC: 0 % (ref 0.0–0.2)

## 2023-06-30 LAB — I-STAT CHEM 8, ED
BUN: 24 mg/dL — ABNORMAL HIGH (ref 8–23)
Calcium, Ion: 1.21 mmol/L (ref 1.15–1.40)
Chloride: 106 mmol/L (ref 98–111)
Creatinine, Ser: 0.9 mg/dL (ref 0.44–1.00)
Glucose, Bld: 138 mg/dL — ABNORMAL HIGH (ref 70–99)
HCT: 39 % (ref 36.0–46.0)
Hemoglobin: 13.3 g/dL (ref 12.0–15.0)
Potassium: 4.1 mmol/L (ref 3.5–5.1)
Sodium: 140 mmol/L (ref 135–145)
TCO2: 22 mmol/L (ref 22–32)

## 2023-06-30 LAB — PROTIME-INR
INR: 1.1 (ref 0.8–1.2)
Prothrombin Time: 14.3 s (ref 11.4–15.2)

## 2023-06-30 LAB — APTT: aPTT: 30 s (ref 24–36)

## 2023-06-30 LAB — ETHANOL: Alcohol, Ethyl (B): <15 mg/dL (ref <15)

## 2023-06-30 MED ORDER — AMINO ACIDS COMPLEX PO TABS: 1.0000 | ORAL_TABLET | Freq: Every day | ORAL | Status: DC

## 2023-06-30 MED ORDER — STROKE: EARLY STAGES OF RECOVERY BOOK: Freq: Once | Status: DC

## 2023-06-30 MED ORDER — IOHEXOL 350 MG/ML SOLN
75.0000 mL | Freq: Once | INTRAVENOUS | Status: AC | PRN
  Administered 2023-06-30: 75 mL via INTRAVENOUS

## 2023-06-30 MED ORDER — ADULT MULTIVITAMIN W/MINERALS CH
1.0000 | ORAL_TABLET | Freq: Every day | ORAL | Status: DC
  Administered 2023-07-01 – 2023-07-02 (×2): 1 via ORAL
  Filled 2023-06-30 (×2): qty 1

## 2023-06-30 MED ORDER — SENNOSIDES-DOCUSATE SODIUM 8.6-50 MG PO TABS: 1.0000 | ORAL_TABLET | Freq: Every evening | ORAL | Status: DC | PRN

## 2023-06-30 MED ORDER — CO Q 10 10 MG PO CAPS: ORAL_CAPSULE | Freq: Every day | ORAL | Status: DC

## 2023-06-30 MED ORDER — ASPIRIN 81 MG PO CHEW
81.0000 mg | CHEWABLE_TABLET | Freq: Every day | ORAL | Status: DC
  Administered 2023-06-30 – 2023-07-02 (×3): 81 mg via ORAL
  Filled 2023-06-30 (×3): qty 1

## 2023-06-30 MED ORDER — ACETAMINOPHEN 325 MG PO TABS: 650.0000 mg | ORAL_TABLET | ORAL | Status: DC | PRN

## 2023-06-30 MED ORDER — ZINC SULFATE 220 (50 ZN) MG PO CAPS
220.0000 mg | ORAL_CAPSULE | Freq: Every day | ORAL | Status: DC
  Administered 2023-07-01 – 2023-07-02 (×2): 220 mg via ORAL
  Filled 2023-06-30 (×2): qty 1

## 2023-06-30 MED ORDER — CINNAMON 500 MG PO CAPS: ORAL_CAPSULE | Freq: Every day | ORAL | Status: DC

## 2023-06-30 MED ORDER — MAGNESIUM OXIDE -MG SUPPLEMENT 400 (240 MG) MG PO TABS
200.0000 mg | ORAL_TABLET | Freq: Every day | ORAL | Status: DC
  Administered 2023-07-01 – 2023-07-02 (×2): 200 mg via ORAL
  Filled 2023-06-30 (×2): qty 1

## 2023-06-30 MED ORDER — ATORVASTATIN CALCIUM 40 MG PO TABS
40.0000 mg | ORAL_TABLET | Freq: Every day | ORAL | Status: DC
  Administered 2023-07-01 – 2023-07-02 (×2): 40 mg via ORAL
  Filled 2023-06-30 (×2): qty 1

## 2023-06-30 MED ORDER — STROKE: EARLY STAGES OF RECOVERY BOOK
Freq: Once | Status: AC
  Filled 2023-06-30: qty 1

## 2023-06-30 MED ORDER — ACETAMINOPHEN 650 MG RE SUPP: 650.0000 mg | RECTAL | Status: DC | PRN

## 2023-06-30 MED ORDER — SODIUM CHLORIDE 0.9 % IV SOLN: INTRAVENOUS | Status: DC

## 2023-06-30 MED ORDER — ASPIRIN 81 MG PO CHEW: 81.0000 mg | CHEWABLE_TABLET | Freq: Every day | ORAL | Status: DC

## 2023-06-30 MED ORDER — STUDY - LIBREXIA-STROKE - MILVEXIAN 25 MG OR PLACEBO TABLET (PI-SETHI): 1.0000 | ORAL_TABLET | Freq: Two times a day (BID) | ORAL | Status: DC

## 2023-06-30 MED ORDER — ACETAMINOPHEN 160 MG/5ML PO SOLN: 650.0000 mg | ORAL | Status: DC | PRN

## 2023-06-30 MED ORDER — DIPHENHYDRAMINE HCL 50 MG/ML IJ SOLN
25.0000 mg | Freq: Once | INTRAMUSCULAR | Status: AC
  Administered 2023-06-30: 25 mg via INTRAVENOUS
  Filled 2023-06-30: qty 1

## 2023-06-30 MED ORDER — LOSARTAN POTASSIUM 25 MG PO TABS
12.5000 mg | ORAL_TABLET | Freq: Every day | ORAL | Status: DC
  Administered 2023-07-01 – 2023-07-02 (×2): 12.5 mg via ORAL
  Filled 2023-06-30: qty 1
  Filled 2023-06-30 (×2): qty 0.5

## 2023-06-30 MED ORDER — TICAGRELOR 90 MG PO TABS
90.0000 mg | ORAL_TABLET | Freq: Two times a day (BID) | ORAL | Status: DC
  Administered 2023-07-01: 90 mg via ORAL
  Filled 2023-06-30: qty 1

## 2023-06-30 NOTE — Consult Note (Signed)
 NEUROLOGY CONSULT NOTE   Date of service: June 30, 2023 Patient Name: Jacqueline Bonilla MRN:  [id number] DOB:  [birth year] Chief Complaint: Right-sided weakness, unsteady gait and confusion Requesting Provider: Garrick Charleston, MD  History of Present Illness  Jacqueline Bonilla is a 68 y.o. female with hx of 2 left MCA territory infarcts currently participating in the [place] trial, loop recorder placement, vascular dementia, sleep apnea, hyperlipidemia, thrombocytopenia, osteoporosis, hypertension, depression, anxiety and anemia who presents with acute onset confusion, gait instability, right facial droop and right-sided weakness.  Patient was in her usual state of health earlier this morning, took all of her medications, including her medication for the Librexia trial, and did her physical therapy exercises.  Later in the day, she got into the car with her husband and was noted to have gait instability, right-sided weakness, right facial droop greater than normal as well as confusion and inability to answer basic questions.  No twitching or shaking movements were observed.  Patient's husband states that she does have memory problems at baseline and he handles her medications for her.  He noted that a few days ago, she was about to go to the bathroom and was unable to move bilateral feet.  She reports no other recent symptoms.  Of note, patient had a left MCA stroke with thrombectomy and TNK administration in 2022 with a subarachnoid hemorrhage noted afterwards.  She has also had an abnormal EEG in the past.  LKW: 1445 Modified rankin score: 2-Slight disability-UNABLE to perform all activities but does not need assistance IV Thrombolysis: No, deficits largely resolved on admission, recent stroke and participation in [place] trial EVT: No, no LVO  NIHSS components Score: Comment  1a Level of Conscious 0[x]  1[]  2[]  3[]      1b LOC Questions 0[]  1[]  2[x]       1c LOC Commands 0[x]  1[]  2[]       2  Best Gaze 0[x]  1[]  2[]       3 Visual 0[x]  1[]  2[]  3[]      4 Facial Palsy 0[]  1[x]  2[]  3[]      5a Motor Arm - left 0[x]  1[]  2[]  3[]  4[]  UN[]    5b Motor Arm - Right 0[x]  1[]  2[]  3[]  4[]  UN[]    6a Motor Leg - Left 0[]  1[x]  2[]  3[]  4[]  UN[]    6b Motor Leg - Right 0[]  1[x]  2[]  3[]  4[]  UN[]    7 Limb Ataxia 0[x]  1[]  2[]  UN[]      8 Sensory 0[x]  1[]  2[]  UN[]      9 Best Language 0[x]  1[]  2[]  3[]      10 Dysarthria 0[x]  1[]  2[]  UN[]      11 Extinct. and Inattention 0[x]  1[]  2[]       TOTAL:5       ROS  Comprehensive ROS performed and pertinent positives documented in HPI   Past History   Past Medical History:  Diagnosis Date   Anemia    Anxiety    Depression    Dysmenorrhea    Endometriosis    Fibroid    Hypertension    Implantable loop recorder present 2021   Microhematuria    negative workup   Osteoporosis    Tachycardia    Thrombocytopenia (HCC)     Past Surgical History:  Procedure Laterality Date   BREAST BIOPSY     BUBBLE STUDY  12/08/2020   Procedure: BUBBLE STUDY;  Surgeon: Delford Maude BROCKS, MD;  Location: [facility] ENDOSCOPY;  Service: Cardiovascular;;   CESAREAN SECTION     hysteroscopic  resection     implantable loop recorder implant  10/21/2019   Medtronic Reveal Linq model LNQ 22 (SN [id number]) implantable loop recorder   IR CT HEAD LTD  12/01/2020   IR PERCUTANEOUS ART THROMBECTOMY/INFUSION INTRACRANIAL INC DIAG ANGIO  12/01/2020   IR US  GUIDE VASC ACCESS RIGHT  12/01/2020   ORIF HUMERUS FRACTURE Left 07/31/2021   Procedure: OPEN REDUCTION INTERNAL FIXATION (ORIF) DISTAL HUMERUS FRACTURE;  Surgeon: Kendal Franky SQUIBB, MD;  Location: [facility] OR;  Service: Orthopedics;  Laterality: Left;   RADIOLOGY WITH ANESTHESIA N/A 12/01/2020   Procedure: IR WITH ANESTHESIA - CODE STROKE;  Surgeon: Radiologist, Medication, MD;  Location: [facility] OR;  Service: Radiology;  Laterality: N/A;   TEE WITHOUT CARDIOVERSION N/A 12/08/2020   Procedure: TRANSESOPHAGEAL ECHOCARDIOGRAM (TEE);  Surgeon: Delford Maude BROCKS, MD;  Location: [facility] ENDOSCOPY;  Service: Cardiovascular;  Laterality: N/A;    Family History: Family History  Problem Relation Age of Onset   Cancer Father        non hodgkin lymphoma & skin   Heart attack Maternal Grandfather    Dementia Mother    Polymyositis Sister     Social History  reports that she has never smoked. She has never used smokeless tobacco. She reports that she does not drink alcohol and does not use drugs.  Allergies  Allergen Reactions   Avelox [Moxifloxacin] Hives   Ciprofloxacin Hives   Floraquin [Iodoquinol]     No cipro or others   Iohexol       Code: HIVES, Desc: HIVES S/P IVP MANY YRS AGO    Levaquin [Levofloxacin] Hives   Plavix  [Clopidogrel ] Diarrhea   Quinolones Hives   Shellfish Allergy Itching    Itching under the chin. Described by patient but not seen by husband.    Medications  No current facility-administered medications for this encounter.  Current Outpatient Medications:    acetaminophen  (TYLENOL ) 325 MG tablet, Take 2 tablets (650 mg total) by mouth every 4 (four) hours as needed for mild pain (pain score 1-3) (or temp > 37.5 C (99.5 F))., Disp: , Rfl:    Amino Acids Complex TABS, Take 1 tablet by mouth daily., Disp: , Rfl:    aspirin  81 MG chewable tablet, Chew 1 tablet (81 mg total) by mouth daily., Disp: , Rfl:    atorvastatin  (LIPITOR) 40 MG tablet, Take 1 tablet (40 mg total) by mouth daily., Disp: 90 tablet, Rfl: 1   CINNAMON PO, Take 1 capsule by mouth daily., Disp: , Rfl:    Coenzyme Q10 (CO Q 10 PO), Take 1 capsule by mouth daily., Disp: , Rfl:    losartan  (COZAAR ) 25 MG tablet, Take 0.5 tablets (12.5 mg total) by mouth daily., Disp: 30 tablet, Rfl: 0   MAGNESIUM PO, Take 1 tablet by mouth at bedtime., Disp: , Rfl:    Multiple Vitamin (MULTIVITAMIN WITH MINERALS) TABS tablet, Take 1 tablet by mouth daily., Disp: , Rfl:    Multiple Vitamins-Minerals (ZINC PO), Take 1 tablet by mouth daily., Disp: , Rfl:    Study -  LIBREXIA-STROKE - milvexian 25 mg or placebo tablet (PI-Sethi), Take 1 tablet by mouth 2 (two) times daily., Disp: 280 tablet, Rfl: 0   ticagrelor  (BRILINTA ) 90 MG TABS tablet, Take 1 tablet (90 mg total) by mouth 2 (two) times daily., Disp: 60 tablet, Rfl: 0  Vitals   Vitals:   06/30/23 1542 06/30/23 1544  BP: (!) 139/93   Pulse: 89   Resp: 18   Temp: 98.7 F (  37.1 C)   SpO2: 98%   Weight:  53.1 kg  Height:  5' 4 (1.626 m)    Body mass index is 20.08 kg/m.   Physical Exam   Constitutional: Appears well-developed and well-nourished.  Psych: Affect appropriate to situation.  Eyes: No scleral injection.  HENT: No OP obstruction.  Head: Normocephalic.  Cardiovascular: Normal rate and regular rhythm.  Respiratory: Effort normal, non-labored breathing.  Skin: WDI.   Neurologic Examination    NEURO:  Mental Status: Alert and oriented to person and place, disoriented to time and situation, unable to say why she is in the hospital Speech/Language: speech is without dysarthria or aphasia.  Naming, repetition, fluency, and comprehension intact.  Cranial Nerves:  II: PERRL. Visual fields full.  III, IV, VI: EOMI. Eyelids elevate symmetrically.  V: Sensation is intact to light touch and symmetrical to face.  VII: Subtle right facial droop VIII: hearing intact to voice. IX, X: Phonation is normal.  XII: tongue is midline without fasciculations. Motor: Able to move all 4 extremities with antigravity strength, but some limitation of motion of lower extremities due to pain, some drift in bilateral lower extremities symmetrical and also likely due to back pain Tone: is normal and bulk is normal Sensation- Intact to light touch bilaterally. Extinction absent to light touch to DSS.  Coordination: FTN intact bilaterally Gait- deferred   Labs/Imaging/Neurodiagnostic studies   CBC:  Recent Labs  Lab Jul 17, 2023 1549 07/17/23 1553  WBC 6.3  --   NEUTROABS 3.9  --   HGB 12.5 13.3   HCT 38.6 39.0  MCV 90.8  --   PLT 232  --    Basic Metabolic Panel:  Lab Results  Component Value Date   NA 140 07/17/2023   K 4.1 17-Jul-2023   CO2 27 05/24/2023   GLUCOSE 138 (H) 07-17-23   BUN 24 (H) 2023/07/17   CREATININE 0.90 07/17/23   CALCIUM  9.4 05/24/2023   GFRNONAA 58 (L) 04/02/2023   GFRAA 78 07/20/2019   Lipid Panel:  Lab Results  Component Value Date   LDLCALC 111 (H) 03/28/2023   HgbA1c:  Lab Results  Component Value Date   HGBA1C 5.7 (H) 02/19/2023   Urine Drug Screen:     Component Value Date/Time   LABOPIA NONE DETECTED 03/27/2023 1127   COCAINSCRNUR NONE DETECTED 03/27/2023 1127   LABBENZ NONE DETECTED 03/27/2023 1127   AMPHETMU NONE DETECTED 03/27/2023 1127   THCU POSITIVE (A) 03/27/2023 1127   LABBARB NONE DETECTED 03/27/2023 1127    Alcohol Level     Component Value Date/Time   ETH <10 03/27/2023 0947   INR  Lab Results  Component Value Date   INR 1.1 2023/07/17   APTT  Lab Results  Component Value Date   APTT 30 07/17/23   CT Head without contrast(Personally reviewed): No acute abnormality  CT angio Head and Neck with contrast(Personally reviewed): No LVO  MRI Brain(Personally reviewed): Pending  Neurodiagnostics rEEG:  Pending  ASSESSMENT   Jacqueline Bonilla is a 68 y.o. female  with hx of 2 left MCA territory infarcts currently participating in the [place] trial, loop recorder placement, vascular dementia, sleep apnea, hyperlipidemia, thrombocytopenia, osteoporosis, hypertension, depression, anxiety and anemia who presents with acute onset confusion, gait disturbance, right facial droop and right-sided weakness.  Patient's husband reports that she was in her usual state of health earlier in the day, took all of her medications and did her physical therapy exercises and then went to get in the  car with him and had worsened gait instability, severe right facial droop, right-sided weakness and acute onset confusion.  On  arrival to the ED, she was noted to have a subtle right facial droop, confusion and symmetrical drift in the lower extremities.  No acute abnormality was seen on initial head CT, and CT angiogram revealed no LVO.  Will obtain MRI and admit patient for workup of stroke versus TIA.  As she has had an abnormal EEG in the past and was on Keppra  at one time but not any longer, we will also obtain EEG to rule out seizure activity as a cause of patient's altered mental status.  Of note, patient does have history of an old reaction to IVP dye, so Benadryl  was administered after CT angiogram.  RECOMMENDATIONS  Stroke/TIA Workup  - Admit for stroke workup - Permissive HTN x48 hrs from sx onset or until stroke ruled out by MRI goal BP <220/110. PRN labetalol  or hydralazine if BP above these parameters. Avoid oral antihypertensives. - MRI brain wo contrast - TTE  - Check A1c and LDL + add statin per guidelines - q4 hr neuro checks - STAT head CT for any change in neuro exam - Tele - PT/OT/SLP - Stroke education - Amb referral to neurology upon discharge   - Routine EEG -Resume Librexia trial medication if patient can swallow, will need to discontinue if MRI positive for stroke-stroke team to decide in the morning Stroke team to follow  Dr. Marlyn HOCKEY aware ______________________________________________________________________  Patient seen by NP with MD, MD to edit note as needed.  Signed, Cortney E Everitt Clint Kill, NP [facility]    Attending Neurohospitalist Addendum Patient seen and examined with APP/Resident. Agree with the history and physical as documented above. Agree with the plan as documented, which I helped formulate. I have independently reviewed the chart, obtained history, review of systems and examined the patient.I have personally reviewed pertinent head/neck/spine imaging (CT/MRI). Please feel free to call with any questions.  -- Eligio Lav,  MD Neurologist [facility] Pager: [phone]

## 2023-06-30 NOTE — H&P (Signed)
 History and Physical    Patient: Jacqueline Bonilla FMW:[id number] DOB: 06/08/1955 DOA: 06/30/2023 DOS: the patient was seen and examined on 06/30/2023 PCP: Purcell Emil Schanz, MD  Patient coming from: Home  Chief Complaint:  Chief Complaint  Patient presents with   Weakness   HPI: Jacqueline Bonilla is a 68 y.o. female with medical history significant of previous history of CVA, hyperlipidemia, normocytic anemia, vascular dementia, essential hypertension, statin intolerance, gait disturbance, thrombocytopenia, who presents with slurred speech and worsening right-sided weakness.  Patient has sudden onset of symptoms.  Was seen and evaluated in the ER.  Code stroke was initiated.  Seen by neurologist.  Symptoms have now largely resolved although she still has some residual weakness.  Family says she has been dizzy and falling.  Patient is being admitted for workup of TIA.  Patient apparently has statin intolerance and is enrolled in some study drug for hyperlipidemia.  Review of Systems: As mentioned in the history of present illness. All other systems reviewed and are negative. Past Medical History:  Diagnosis Date   Anemia    Anxiety    Depression    Dysmenorrhea    Endometriosis    Fibroid    Hypertension    Implantable loop recorder present 2021   Microhematuria    negative workup   Osteoporosis    Tachycardia    Thrombocytopenia (HCC)    Past Surgical History:  Procedure Laterality Date   BREAST BIOPSY     BUBBLE STUDY  12/08/2020   Procedure: BUBBLE STUDY;  Surgeon: Delford Maude BROCKS, MD;  Location: [facility] ENDOSCOPY;  Service: Cardiovascular;;   CESAREAN SECTION     hysteroscopic resection     implantable loop recorder implant  10/21/2019   Medtronic Reveal Linq model LNQ 22 ([place] [id number]) implantable loop recorder   IR CT HEAD LTD  12/01/2020   IR PERCUTANEOUS ART THROMBECTOMY/INFUSION INTRACRANIAL INC DIAG ANGIO  12/01/2020   IR US  GUIDE VASC ACCESS RIGHT  12/01/2020    ORIF HUMERUS FRACTURE Left 07/31/2021   Procedure: OPEN REDUCTION INTERNAL FIXATION (ORIF) DISTAL HUMERUS FRACTURE;  Surgeon: Kendal Franky SHAUNNA, MD;  Location: [facility] OR;  Service: Orthopedics;  Laterality: Left;   RADIOLOGY WITH ANESTHESIA N/A 12/01/2020   Procedure: IR WITH ANESTHESIA - CODE STROKE;  Surgeon: Radiologist, Medication, MD;  Location: [facility] OR;  Service: Radiology;  Laterality: N/A;   TEE WITHOUT CARDIOVERSION N/A 12/08/2020   Procedure: TRANSESOPHAGEAL ECHOCARDIOGRAM (TEE);  Surgeon: Delford Maude BROCKS, MD;  Location: [facility] ENDOSCOPY;  Service: Cardiovascular;  Laterality: N/A;   Social History:  reports that she has never smoked. She has never used smokeless tobacco. She reports that she does not drink alcohol and does not use drugs.  Allergies  Allergen Reactions   Avelox [Moxifloxacin] Hives   Ciprofloxacin Hives   Floraquin [Iodoquinol] Other (See Comments)    No cipro or others   Iohexol  Other (See Comments)     Code: HIVES, Desc: HIVES S/P IVP MANY YRS AGO    Levaquin [Levofloxacin] Hives   Plavix  [Clopidogrel ] Diarrhea   Quinolones Hives   Shellfish Allergy Itching    Itching under the chin. Described by patient but not seen by husband.    Family History  Problem Relation Age of Onset   Cancer Father        non hodgkin lymphoma & skin   Heart attack Maternal Grandfather    Dementia Mother    Polymyositis Sister     Prior to Admission medications  Medication Sig Start Date End Date Taking? Authorizing Provider  acetaminophen  (TYLENOL ) 325 MG tablet Take 2 tablets (650 mg total) by mouth every 4 (four) hours as needed for mild pain (pain score 1-3) (or temp > 37.5 C (99.5 F)). 04/08/23   Angiulli, Toribio PARAS, PA-C  Amino Acids Complex TABS Take 1 tablet by mouth daily.    [provider]  aspirin  81 MG chewable tablet Chew 1 tablet (81 mg total) by mouth daily. 04/02/23   Remi Pippin, NP  atorvastatin  (LIPITOR) 40 MG tablet Take 1 tablet (40 mg total) by mouth daily.  05/29/23   Sagardia, Miguel Jose, MD  CINNAMON PO Take 1 capsule by mouth daily.    [provider]  Coenzyme Q10 (CO Q 10 PO) Take 1 capsule by mouth daily.    [provider]  losartan  (COZAAR ) 25 MG tablet Take 0.5 tablets (12.5 mg total) by mouth daily. 04/08/23   Angiulli, Daniel J, PA-C  MAGNESIUM PO Take 1 tablet by mouth at bedtime.    [provider]  Multiple Vitamin (MULTIVITAMIN WITH MINERALS) TABS tablet Take 1 tablet by mouth daily.    [provider]  Multiple Vitamins-Minerals (ZINC PO) Take 1 tablet by mouth daily.    [provider]  Study - LIBREXIA-STROKE - milvexian 25 mg or placebo tablet (PI-Sethi) Take 1 tablet by mouth 2 (two) times daily. 06/26/23   Rosemarie Eather RAMAN, MD  ticagrelor  (BRILINTA ) 90 MG TABS tablet Take 1 tablet (90 mg total) by mouth 2 (two) times daily. 04/08/23   Pegge Toribio PARAS, PA-C    Physical Exam: Vitals:   06/30/23 1715 06/30/23 2015 06/30/23 2130 06/30/23 2137  BP: (!) 159/102 133/84 (!) 163/103   Pulse: 72 88 (!) 101   Resp: 16 15 12    Temp:    98.2 F (36.8 C)  TempSrc:    Oral  SpO2: 100% 100% 99%   Weight:      Height:       Constitutional: Frail, weak, NAD, calm, comfortable Eyes: PERRL, lids and conjunctivae normal ENMT: Mucous membranes are moist. Posterior pharynx clear of any exudate or lesions.Normal dentition.  Neck: normal, supple, no masses, no thyromegaly Respiratory: clear to auscultation bilaterally, no wheezing, no crackles. Normal respiratory effort. No accessory muscle use.  Cardiovascular: Regular rate and rhythm, no murmurs / rubs / gallops. No extremity edema. 2+ pedal pulses. No carotid bruits.  Abdomen: no tenderness, no masses palpated. No hepatosplenomegaly. Bowel sounds positive.  Musculoskeletal: Good range of motion, no joint swelling or tenderness, Skin: no rashes, lesions, ulcers. No induration Neurologic: CN 2-12 grossly intact. Sensation intact, DTR normal.  Strength 5/5 in all 4.  Residual right hemiparesis, speech is back to normal Psychiatric: Normal judgment and insight. Alert and oriented x 3. Normal mood  Data Reviewed:  Blood pressure 163/103, pulse 101, CBC and chemistry largely within normal, glucose 138, urine drug screen is negative MRI of the brain and head CT without Were Negative.  CT Angio Head and Neck Showed Intracranial Atherosclerotic Disease and No Proximal Intracranial Large Vessel Occlusion.  Assessment and Plan:  #1 TIA: In the setting of known CVA history.  Patient will be admitted for observation.  Has had MRI of the brain already.  Also recent echocardiogram in March.  Will therefore continue with PT and OT.  #2 vascular dementia: Continue home regimen  #3 hyperlipidemia: Patient has statin intolerance.  Enroll in study medications apparently.  Will defer to neurology with  outpatient treatment  #4 essential hypertension: Will resume home regimen.  No permissive hypertension    Advance Care Planning:   Code Status: Full Code   Consults: Neurology, Dr. Voncile  Family Communication: Son and daughter at bedside  Severity of Illness: The appropriate patient status for this patient is OBSERVATION. Observation status is judged to be reasonable and necessary in order to provide the required intensity of service to ensure the patient's safety. The patient's presenting symptoms, physical exam findings, and initial radiographic and laboratory data in the context of their medical condition is felt to place them at decreased risk for further clinical deterioration. Furthermore, it is anticipated that the patient will be medically stable for discharge from the hospital within 2 midnights of admission.   AuthorBETHA SIM KNOLL, MD 06/30/2023 10:19 PM  For on call review [email or web address].

## 2023-06-30 NOTE — ED Triage Notes (Signed)
 Confirmed with husband that LKW was 1445. Right sided droop, unsteady gait causing pt to fall. Pt complaining of some lower back pain rating 10/10 following fall.

## 2023-06-30 NOTE — Code Documentation (Addendum)
 Stroke Response Nurse Documentation Code Documentation  Jacqueline Bonilla is a 68 y.o. female arriving to [facility]  via [employer] with LKW 1445 now complaining of left droop and right sided weakness.  Stroke team at bedside in CT upon activation. NIH 5, see flowsheet for details. CT/CTA completed. No TNK as symptoms mild/improving, recent stroke, and pt participant in [place] trial. No thrombectomy d/t no LVO.Some delay with CTA due to IV access/contrast infiltrated. Care Plan: q2h NIH and vitals x12h then per unit routine. Bedside handoff with ED RN Arnaldo.    Tonna Lacks K  Rapid Response RN

## 2023-06-30 NOTE — ED Notes (Signed)
 Attempted to call husbands phone twice.

## 2023-06-30 NOTE — ED Provider Triage Note (Signed)
 Emergency Medicine Provider Triage Evaluation Note  Jacqueline Bonilla , a 68 y.o. female  was evaluated in triage.  Pt complains of fall about an hour ago.  She did not register with strokelike symptoms.  Does have right-sided weakness, last known normal is unknown.  Has a hard time collecting her words.  Does have left-sided droop at this time.  Review of Systems  Positive: Left-sided facial droop, right-sided weakness Negative: Headache, chest pain, shortness of breath  Physical Exam  LMP 01/02/2003  Gen:   Awake, no distress   Resp:  Normal effort  MSK:   Moves extremities without difficulty  Other:  Right arm weakness, left-sided droop noted.  Medical Decision Making  Medically screening exam initiated at 3:39 PM.  Appropriate orders placed.  Jacqueline Bonilla was informed that the remainder of the evaluation will be completed by another provider, this initial triage assessment does not replace that evaluation, and the importance of remaining in the ED until their evaluation is complete.  Attempted to obtain history from husband who went to park the car 3:45 PM no known last known normal   Maureen Broad, PA-C 06/30/23 1545

## 2023-06-30 NOTE — ED Provider Notes (Signed)
 [place] EMERGENCY DEPARTMENT AT [facility] Provider Note   CSN: [id number] Arrival date & time: 06/30/23  1536     Patient presents with: Weakness   Jacqueline Bonilla is a 68 y.o. female.   HPI Patient with multiple prior strokes, currently on novel antiplatelet agent as a part of study presents with concern for weakness, speech difficulty.  Speech difficulty was transient, weakness is persistent.  Patient has baseline weakness, goes to physical therapy, but seemingly has left weakness beyond baseline.  Level 5 caveat acuity of condition    Prior to Admission medications   Medication Sig Start Date End Date Taking? Authorizing Provider  acetaminophen  (TYLENOL ) 325 MG tablet Take 2 tablets (650 mg total) by mouth every 4 (four) hours as needed for mild pain (pain score 1-3) (or temp > 37.5 C (99.5 F)). 04/08/23   Angiulli, Toribio PARAS, PA-C  Amino Acids Complex TABS Take 1 tablet by mouth daily.    [provider]  aspirin  81 MG chewable tablet Chew 1 tablet (81 mg total) by mouth daily. 04/02/23   Remi Pippin, NP  atorvastatin  (LIPITOR) 40 MG tablet Take 1 tablet (40 mg total) by mouth daily. 05/29/23   Sagardia, Miguel Jose, MD  CINNAMON PO Take 1 capsule by mouth daily.    [provider]  Coenzyme Q10 (CO Q 10 PO) Take 1 capsule by mouth daily.    [provider]  losartan  (COZAAR ) 25 MG tablet Take 0.5 tablets (12.5 mg total) by mouth daily. 04/08/23   Angiulli, Daniel J, PA-C  MAGNESIUM PO Take 1 tablet by mouth at bedtime.    [provider]  Multiple Vitamin (MULTIVITAMIN WITH MINERALS) TABS tablet Take 1 tablet by mouth daily.    [provider]  Multiple Vitamins-Minerals (ZINC PO) Take 1 tablet by mouth daily.    [provider]  Study - LIBREXIA-STROKE - milvexian 25 mg or placebo tablet (PI-Sethi) Take 1 tablet by mouth 2 (two) times daily. 06/26/23   Rosemarie Eather RAMAN, MD  ticagrelor  (BRILINTA ) 90 MG TABS tablet Take  1 tablet (90 mg total) by mouth 2 (two) times daily. 04/08/23   Angiulli, Toribio PARAS, PA-C    Allergies: Avelox [moxifloxacin], Ciprofloxacin, Floraquin [iodoquinol], Iohexol , Levaquin [levofloxacin], Plavix  [clopidogrel ], Quinolones, and Shellfish allergy    Review of Systems  Updated Vital Signs BP (!) 159/102   Pulse 72   Temp 98.7 F (37.1 C)   Resp 16   Ht 5' 4 (1.626 m)   Wt 53.1 kg   LMP 01/02/2003   SpO2 100%   BMI 20.08 kg/m   Physical Exam Vitals and nursing note reviewed.  Constitutional:      General: She is not in acute distress.    Appearance: She is well-developed.  HENT:     Head: Normocephalic and atraumatic.   Eyes:     Conjunctiva/sclera: Conjunctivae normal.    Cardiovascular:     Rate and Rhythm: Normal rate and regular rhythm.  Pulmonary:     Effort: Pulmonary effort is normal. No respiratory distress.     Breath sounds: Normal breath sounds. No stridor.  Abdominal:     General: There is no distension.   Skin:    General: Skin is warm and dry.   Neurological:     Mental Status: She is alert.     Cranial Nerves: No cranial nerve deficit.     Motor: Weakness present.   Psychiatric:  Mood and Affect: Mood normal.     (all labs ordered are listed, but only abnormal results are displayed) Labs Reviewed  COMPREHENSIVE METABOLIC PANEL WITH GFR - Abnormal; Notable for the following components:      Result Value   CO2 21 (*)    Glucose, Bld 135 (*)    All other components within normal limits  I-STAT CHEM 8, ED - Abnormal; Notable for the following components:   BUN 24 (*)    Glucose, Bld 138 (*)    All other components within normal limits  ETHANOL  PROTIME-INR  APTT  CBC  DIFFERENTIAL  RAPID URINE DRUG SCREEN, HOSP PERFORMED  LIPID PANEL  HEMOGLOBIN A1C    EKG: EKG Interpretation Date/Time:  Sunday June 30 2023 16:28:54 EDT Ventricular Rate:  74 PR Interval:  164 QRS Duration:  100 QT Interval:  405 QTC  Calculation: 450 R Axis:   3  Text Interpretation: Sinus rhythm Abnormal R-wave progression, early transition Confirmed by Garrick Charleston [phone]) on 06/30/2023 5:54:28 PM  Radiology: CT ANGIO HEAD NECK W WO CM (CODE STROKE) Result Date: 06/30/2023 CLINICAL DATA:  Provided history: Stroke, follow-up. Left facial droop. Right-sided weakness. EXAM: CT ANGIOGRAPHY HEAD AND NECK WITH AND WITHOUT CONTRAST TECHNIQUE: Multidetector CT imaging of the head and neck was performed using the standard protocol during bolus administration of intravenous contrast. Multiplanar CT image reconstructions and MIPs were obtained to evaluate the vascular anatomy. Carotid stenosis measurements (when applicable) are obtained utilizing NASCET criteria, using the distal internal carotid diameter as the denominator. RADIATION DOSE REDUCTION: This exam was performed according to the departmental dose-optimization program which includes automated exposure control, adjustment of the mA and/or kV according to patient size and/or use of iterative reconstruction technique. CONTRAST:  75mL OMNIPAQUE  IOHEXOL  350 MG/ML SOLN COMPARISON:  CT angiogram head/neck 03/07/2023. FINDINGS: CTA NECK FINDINGS Aortic arch: Standard aortic branching. Atherosclerotic plaque within the visible thoracic aorta and proximal major branch vessels of the neck. No hemodynamically significant innominate or proximal subclavian artery stenosis. Right carotid system: CCA and ICA patent within the neck without stenosis. Minimal atherosclerotic plaque about the carotid bifurcation. Left carotid system: CCA and ICA patent within the neck without stenosis or significant atherosclerotic disease Vertebral arteries: Codominant and patent within the neck without stenosis or significant atherosclerotic disease. Skeleton: Spondylosis at the cervical and visualized upper thoracic levels. No acute fracture or aggressive osseous lesion. Other neck: Subcentimeter thyroid  nodules not  meeting consensus criteria for ultrasound follow-up based on size. No follow-up imaging recommended. Reference: J Am Coll Radiol. 2015 Feb;12(2): 143-50. Upper chest: No consolidation within the imaged lung apices. Review of the MIP images confirms the above findings CTA HEAD FINDINGS Anterior circulation: The intracranial internal carotid arteries are patent. Nonstenotic atherosclerotic plaque within both vessels. The M1 middle cerebral arteries are patent. No right M2 proximal branch occlusion or high-grade proximal stenosis. Known severe distal left M2/proximal M3 stenosis (series 12, image 40). The anterior cerebral arteries are patent. No intracranial aneurysm is identified. Posterior circulation: The intracranial vertebral arteries are patent. The basilar artery is patent. The posterior cerebral arteries are patent. Severe stenosis within the right PCA P2 segment, unchanged (series 14, image 22) (series 15, image 24). Venous sinuses: Within the limitations of contrast timing, no convincing thrombus. Anatomic variants: As described. Review of the MIP images confirms the above findings No emergent large vessel occlusion identified. These results were communicated to Dr. Voncile at 4:53 pmon 6/29/2025by text page via the [facility] messaging system.  IMPRESSION: CTA neck: The common carotid, internal carotid and vertebral arteries are patent within the neck without stenosis. Minimal atherosclerotic plaque about the right carotid bifurcation. CTA head: 1. No proximal intracranial large vessel occlusion identified. 2. Intracranial atherosclerotic disease, most notably as follows. Known severe distal M2/proximal M3 left middle cerebral artery stenosis. Known severe stenosis within the right PCA P2 segment. Electronically Signed   By: Rockey Childs D.O.   On: 06/30/2023 16:54   CT HEAD CODE STROKE WO CONTRAST Result Date: 06/30/2023 CLINICAL DATA:  Code stroke. Neuro deficit, acute, stroke suspected. Left-sided facial  droop. Right-sided weakness. EXAM: CT HEAD WITHOUT CONTRAST TECHNIQUE: Contiguous axial images were obtained from the base of the skull through the vertex without intravenous contrast. RADIATION DOSE REDUCTION: This exam was performed according to the departmental dose-optimization program which includes automated exposure control, adjustment of the mA and/or kV according to patient size and/or use of iterative reconstruction technique. COMPARISON:  Brain MRI 03/28/2023. FINDINGS: Brain: Cerebral atrophy. Known chronic cortical/subcortical infarcts within the bilateral frontal lobes/left insula, bilateral parietal lobes and bilateral occipital lobes. Background advanced chronic small vessel ischemic changes within the cerebral white matter. Multiple small chronic infarcts within the bilateral cerebellar hemispheres, not appreciably changed. There is no acute intracranial hemorrhage. No acute demarcated cortical infarct. No extra-axial fluid collection. No evidence of an intracranial mass. No midline shift. Vascular: No hyperdense vessel.  Atherosclerotic calcifications. Skull: No calvarial fracture or aggressive osseous lesion. Sinuses/Orbits: No mass or acute finding within the imaged orbits. No significant paranasal sinus disease at the imaged levels. ASPECTS [facility] Stroke Program Early CT Score) - Ganglionic level infarction (caudate, lentiform nuclei, internal capsule, insula, M1-M3 cortex): 7 - Supraganglionic infarction (M4-M6 cortex): 3 Total score (0-10 with 10 being normal): 10 (when discounting chronic infarcts). No evidence of an acute intracranial abnormality. These results were communicated to Dr. Voncile at 4:08 pmon 6/29/2025by text page via the [facility] messaging system. IMPRESSION: 1.  No evidence of an acute intracranial abnormality. 2. Parenchymal atrophy, chronic small vessel ischemic disease and chronic infarcts, as described. Electronically Signed   By: Rockey Childs D.O.   On: 06/30/2023 16:08      Procedures   Medications Ordered in the ED   stroke: early stages of recovery book (has no administration in time range)  aspirin  chewable tablet 81 mg (has no administration in time range)  diphenhydrAMINE  (BENADRYL ) injection 25 mg (25 mg Intravenous Given 06/30/23 1630)  iohexol  (OMNIPAQUE ) 350 MG/ML injection 75 mL (75 mLs Intravenous Contrast Given 06/30/23 1623)                                    Medical Decision Making Elderly female presents with weakness, dysarthria, dysarthria was transient, but given her history of prior strokes, elevated risk profile concern for TIA versus stroke remains, patient does not meet his code stroke in triage. I evaluated the patient in the CT scanner after she was designated as a code stroke, discussed case with neurology and case was managed with our neurology colleagues.   Amount and/or Complexity of Data Reviewed External Data Reviewed: notes.    Details: PT notes from 2 days ago included below Labs:  Decision-making details documented in ED Course. Radiology: independent interpretation performed. Decision-making details documented in ED Course. ECG/medicine tests: independent interpretation performed. Decision-making details documented in ED Course.  Risk Decision regarding hospitalization. Diagnosis or treatment significantly limited by social  determinants of health.  PERTINENT HISTORY:    History of stroke History of seizure Hypertension Hyperlipidemia Substance use-marijuana Vascular dementia h/o loop recorder implant Oct. 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 She did have a loop recorder placed at that time and maintained on aspirin . She was taken off Brilinta  in February 2025.  Presented 03/27/2023 with  aphasia altered mental status and unstable gait with frequent falls. CT/MRI showed small acute left MCA infarction. Severe chronic small vessel ischemic disease and cerebral atrophy. CTA showed no large vessel occlusion. She did receive TNK. Admission chemistries unremarkable urine drug screen positive marijuana. Echocardiogram with ejection fraction of 60 to 65% no wall motion abnormalities grade 1 diastolic dysfunction. Neurology follow-up maintained on aspirin  as well as Brilinta  90 mg twice daily x 4 weeks then aspirin  alone with study drug followed by neurology services.   THERAPY DIAG:  Other lack of coordination   Muscle weakness (generalized)   Visuospatial deficit   Attention and concentration deficit   Frontal lobe and executive function deficit   Other symptoms and signs involving the nervous system   Pain in right wrist   Rationale for Evaluation and Treatment: Rehabilitation   SUBJECTIVE:    SUBJECTIVE STATEMENT: Pt and spouse report that pt has great difficulty with getting clothes on and off without crying episodes due to the struggle with the task.   Also note substantial visual change with this last stroke.   Spouse reports transitions torment her ie) between doors etc.   Pt accompanied by: significant other - [name] 5:53 PM Patient ambulating with a very slow shuffling gait.  Husband notes that this is normal when the patient is in unfamiliar surroundings.  I have reviewed the patient's imaging, discussed with her neurology colleagues.  Patient with multiple areas of stenosis intracranial, but no obvious obstruction.  Patient will have MRI, is otherwise ready for admission.  Labs thus far noncontributory, CT without evidence for mass, hemorrhage.  Concern for TIA versus CVA.   Final diagnoses:  Dysarthria  Weakness    ED Discharge Orders     None          Garrick Charleston, MD 06/30/23 1754

## 2023-06-30 NOTE — ED Triage Notes (Signed)
 Patient arrives in wheelchair by POV c/o fall earlier today. Patient presents with left sided facial droop and right sided weakness. Patient speech seems slow to respond however patient states this is normal. Waiting on husband to confirm LNK at this time.

## 2023-06-30 NOTE — ED Notes (Signed)
 Code stroke called. Charge RN aware. Going to CT 1

## 2023-06-30 NOTE — ED Notes (Signed)
 Patient transported to MRI

## 2023-07-01 ENCOUNTER — Observation Stay (HOSPITAL_COMMUNITY)

## 2023-07-01 ENCOUNTER — Ambulatory Visit: Payer: Self-pay | Admitting: Physical Therapy

## 2023-07-01 DIAGNOSIS — E785 Hyperlipidemia, unspecified: Secondary | ICD-10-CM | POA: Diagnosis not present

## 2023-07-01 DIAGNOSIS — G4089 Other seizures: Secondary | ICD-10-CM

## 2023-07-01 DIAGNOSIS — I739 Peripheral vascular disease, unspecified: Secondary | ICD-10-CM

## 2023-07-01 DIAGNOSIS — R569 Unspecified convulsions: Secondary | ICD-10-CM

## 2023-07-01 DIAGNOSIS — G459 Transient cerebral ischemic attack, unspecified: Secondary | ICD-10-CM | POA: Diagnosis not present

## 2023-07-01 LAB — LIPID PANEL
Cholesterol: 158 mg/dL (ref 0–200)
HDL: 47 mg/dL (ref >40)
LDL Cholesterol: 99 mg/dL (ref 0–99)
Total CHOL/HDL Ratio: 3.4 ratio
Triglycerides: 62 mg/dL (ref <150)
VLDL: 12 mg/dL (ref 0–40)

## 2023-07-01 LAB — CBC
HCT: 36.2 % (ref 36.0–46.0)
Hemoglobin: 11.5 g/dL — ABNORMAL LOW (ref 12.0–15.0)
MCH: 29.1 pg (ref 26.0–34.0)
MCHC: 31.8 g/dL (ref 30.0–36.0)
MCV: 91.6 fL (ref 80.0–100.0)
Platelets: 183 10*3/uL (ref 150–400)
RBC: 3.95 MIL/uL (ref 3.87–5.11)
RDW: 15.2 % (ref 11.5–15.5)
WBC: 6.4 10*3/uL (ref 4.0–10.5)
nRBC: 0 % (ref 0.0–0.2)

## 2023-07-01 LAB — CREATININE, SERUM
Creatinine, Ser: 0.7 mg/dL (ref 0.44–1.00)
GFR, Estimated: >60 mL/min

## 2023-07-01 LAB — HEMOGLOBIN A1C
Hgb A1c MFr Bld: 5.5 % (ref 4.8–5.6)
Mean Plasma Glucose: 111.15 mg/dL

## 2023-07-01 NOTE — TOC Initial Note (Addendum)
 Transition of Care [facility]) - Initial/Assessment Note    Patient Details  Name: Jacqueline Bonilla MRN: [id number] Date of Birth: 1955/05/19  [facility]) CM/SW Contact:    Niels LITTIE Portugal, LCSW Phone Number: 07/01/2023, 1:11 PM  Clinical Narrative:                 2:15pm: Patient's insurance authorization has been approved #[id number], next review date 07/04/23.  CSW informed MD of information.  CSW spoke with [place] at [place] to inform her of information. [place] states patient can admit to the facility tomorrow.  1:50pm: CSW spoke with patient's husband to present him with bed offers - [name] selected.  CSW has initiated English as a second language teacher.  1:10pm: CSW spoke with Dr. Jonel who states patient will likely be ready for discharge tomorrow after an ECHO is completed. MD requested CSW initiate SNF workup.  CSW spoke with patient's husband [name] who states he is agreeable for CSW to initiate SNF workup. [name] states patient has not been to SNF before but has been to CIR.  CSW completed FL2 and faxed clinicals via hub to obtain bed offers. Once bed offers are obtained, CSW will provide them to patient and husband so that that a choice can be made.  Expected Discharge Plan: Skilled Nursing Facility Barriers to Discharge: Continued Medical Work up, English as a second language teacher, SNF Pending bed offer   Patient Goals and CMS Choice            Expected Discharge Plan and Services                                              Prior Living Arrangements/Services     Patient language and need for interpreter reviewed:: Yes Do you feel safe going back to the place where you live?: Yes      Need for Family Participation in Patient Care: Yes (Comment) Care giver support system in place?: Yes (comment)   Criminal Activity/Legal Involvement Pertinent to Current Situation/Hospitalization: No - Comment as needed  Activities of Daily Living       Permission Sought/Granted                  Emotional Assessment Appearance:: Appears stated age     Orientation: : Oriented to Self, Oriented to Place, Oriented to  Time, Oriented to Situation Alcohol / Substance Use: Not Applicable Psych Involvement: No (comment)  Admission diagnosis:  TIA (transient ischemic attack) [G45.9] Patient Active Problem List   Diagnosis Date Noted   TIA (transient ischemic attack) 06/30/2023   Diarrhea 05/24/2023   Gait disturbance, 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   Hemiparesis affecting right side as late effect of cerebrovascular accident (CVA) (HCC) 04/23/2023   Apraxia, 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   Frequent falls 02/11/2023   Crushing injury of right hip 02/11/2023   Statin intolerance 04/24/2022   Internal hemorrhoids 01/10/2021   Dyslipidemia 12/29/2020   History of CVA (cerebrovascular accident) 07/14/2020   Normocytic anemia 07/11/2020   Vascular dementia without behavioral disturbance (HCC) 07/15/2019   History of COVID-19 04/22/2019   Essential hypertension 03/03/2019   PCP:  Purcell Emil Schanz, MD Pharmacy:   [facility] [phone] [place], [place] - [address] AT [facility] [address] [name] [address] Phone: [phone] Fax: [phone]  [facility] [place] -  [facility] [address] [name] [address] Phone: [phone] Fax: [phone]  [facility] #[id number] [place], [name] - [address] AT [facility] OF [address] [name] [address] Phone: [phone] Fax: [phone]  [facility] [address] [name] [address] Phone: [phone] Fax: [phone]     Social Drivers of Health (SDOH) Social History: SDOH Screenings   Food Insecurity: No Food Insecurity (03/27/2023)  Housing: Low  Risk  (03/27/2023)  Transportation Needs: No Transportation Needs (03/27/2023)  Utilities: Not At Risk (03/27/2023)  Alcohol Screen: Low Risk  (10/23/2022)  Depression (PHQ2-9): Low Risk  (04/23/2023)  Recent Concern: Depression (PHQ2-9) - Medium Risk (04/17/2023)  Financial Resource Strain: Low Risk  (10/23/2022)  Physical Activity: Sufficiently Active (10/23/2022)  Social Connections: Moderately Isolated (03/27/2023)  Stress: No Stress Concern Present (08/28/2021)  Tobacco Use: Low Risk  (06/30/2023)  Health Literacy: Inadequate Health Literacy (10/23/2022)   SDOH Interventions:     Readmission Risk Interventions     No data to display

## 2023-07-01 NOTE — [place] FL2 (Signed)
 [place]  MEDICAID FL2 LEVEL OF CARE FORM     IDENTIFICATION  Patient Name: Jacqueline Bonilla Birthdate: [birth year] Sex: female Admission Date (Current Location): 06/30/2023  [facility] and [place] Number:  Producer, television/film/video and Address:  [facility] [place], [address], [place], [name] [address]      Provider Number: [id number]  Attending Physician Name and Address:  Jonel Lonni SHAUNNA, *  Relative Name and Phone Number:       Current Level of Care:   Recommended Level of Care: Skilled Nursing Facility Prior Approval Number:    Date Approved/Denied:   PASRR Number: [id number]  Discharge Plan: SNF    Current Diagnoses: Patient Active Problem List   Diagnosis Date Noted   TIA (transient ischemic attack) 06/30/2023   Diarrhea 05/24/2023   Gait disturbance, 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   Hemiparesis affecting right side as late effect of cerebrovascular accident (CVA) (HCC) 04/23/2023   Apraxia, 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   Frequent falls 02/11/2023   Crushing injury of right hip 02/11/2023   Statin intolerance 04/24/2022   Internal hemorrhoids 01/10/2021   Dyslipidemia 12/29/2020   History of CVA (cerebrovascular accident) 07/14/2020   Normocytic anemia 07/11/2020   Vascular dementia without behavioral disturbance (HCC) 07/15/2019   History of COVID-19 04/22/2019   Essential hypertension 03/03/2019    Orientation RESPIRATION BLADDER Height & Weight     Self, Time, Situation, Place  Normal Continent Weight: 117 lb (53.1 kg) Height:  5' 4 (162.6 cm)  BEHAVIORAL SYMPTOMS/MOOD NEUROLOGICAL BOWEL NUTRITION STATUS      Continent Diet (Regular)  AMBULATORY STATUS COMMUNICATION OF NEEDS Skin   Extensive Assist Verbally Normal                       Personal Care Assistance Level of Assistance  Bathing, Feeding, Dressing Bathing Assistance: Limited  assistance Feeding assistance: Independent Dressing Assistance: Limited assistance     Functional Limitations Info  Sight, Speech, Hearing Sight Info: Adequate Hearing Info: Adequate Speech Info: Adequate    SPECIAL CARE FACTORS FREQUENCY  PT (By licensed PT), OT (By licensed OT)     PT Frequency: 5x weekly OT Frequency: 5x weekly            Contractures Contractures Info: Not present    Additional Factors Info  Code Status, Allergies Code Status Info: Full Code Allergies Info: Iohexol   Plavix  (Clopidogrel )  Quinolones  Shellfish Allergy           Current Medications (07/01/2023):  This is the current hospital active medication list Current Facility-Administered Medications  Medication Dose Route Frequency Provider Last Rate Last Admin    stroke: early stages of recovery book   Does not apply Once de La Torre, Cortney E, NP       acetaminophen  (TYLENOL ) tablet 650 mg  650 mg Oral Q4H PRN Sim Emery CROME, MD       Or   acetaminophen  (TYLENOL ) 160 MG/5ML solution 650 mg  650 mg Per Tube Q4H PRN Garba, Mohammad L, MD       Or   acetaminophen  (TYLENOL ) suppository 650 mg  650 mg Rectal Q4H PRN Sim Emery CROME, MD       aspirin  chewable tablet 81 mg  81 mg Oral Daily Garba, Mohammad L, MD   81 mg at 07/01/23 0956   atorvastatin  (LIPITOR) tablet 40 mg  40 mg Oral  Daily Sim Emery CROME, MD   40 mg at 07/01/23 9044   losartan  (COZAAR ) tablet 12.5 mg  12.5 mg Oral Daily Garba, Mohammad L, MD   12.5 mg at 07/01/23 0956   magnesium oxide (MAG-OX) tablet 200 mg  200 mg Oral QHS Garba, Mohammad L, MD       multivitamin with minerals tablet 1 tablet  1 tablet Oral Daily Garba, Mohammad L, MD   1 tablet at 07/01/23 0955   senna-docusate (Senokot-S) tablet 1 tablet  1 tablet Oral QHS PRN Sim Emery CROME, MD       Study - LIBREXIA-STROKE - milvexian 25 mg or placebo tablet (PI-Sethi)  1 tablet Oral BID Garba, Mohammad L, MD       zinc sulfate (50mg  elemental zinc) capsule 220  mg  220 mg Oral Daily Garba, Mohammad L, MD   220 mg at 07/01/23 9043   Current Outpatient Medications  Medication Sig Dispense Refill   Amino Acids Complex TABS Take 1 tablet by mouth daily.     aspirin  81 MG chewable tablet Chew 1 tablet (81 mg total) by mouth daily.     atorvastatin  (LIPITOR) 40 MG tablet Take 1 tablet (40 mg total) by mouth daily. 90 tablet 1   CINNAMON PO Take 1 capsule by mouth daily.     Coenzyme Q10 (CO Q 10 PO) Take 1 capsule by mouth daily.     losartan  (COZAAR ) 25 MG tablet Take 0.5 tablets (12.5 mg total) by mouth daily. 30 tablet 0   MAGNESIUM PO Take 1 tablet by mouth at bedtime.     Multiple Vitamin (MULTIVITAMIN WITH MINERALS) TABS tablet Take 1 tablet by mouth daily.     Multiple Vitamins-Minerals (ZINC PO) Take 1 tablet by mouth daily.     Study - LIBREXIA-STROKE - milvexian 25 mg or placebo tablet (PI-Sethi) Take 1 tablet by mouth 2 (two) times daily. 280 tablet 0     Discharge Medications: Please see discharge summary for a list of discharge medications.  Relevant Imaging Results:  Relevant Lab Results:   Additional Information SS#: [id number]  Niels CROME Portugal, LCSW

## 2023-07-01 NOTE — Progress Notes (Addendum)
 STROKE TEAM PROGRESS NOTE    INTERIM HISTORY/SUBJECTIVE  Patient is seen in her room with no family at the bedside.  She remains hemodynamically stable and afebrile and has had no reoccurrence of symptoms. History as per patient's husband, I spoke to her over the phone patient had a's somewhat similar episode to the prior 1.  Today's episode was much less severe and improved by the time the patient leaves the hospital.  She appeared confused and was not following commands as well as some right facial droop and right-sided weakness.  Patient has had multiple previous admissions for strokes and in 2021 had an abnormal EEG and was thought to have seizures and was put on Keppra  for short period of time but it was discontinued. OBJECTIVE  CBC    Component Value Date/Time   WBC 6.4 07/01/2023 0532   RBC 3.95 07/01/2023 0532   HGB 11.5 (L) 07/01/2023 0532   HGB 13.3 06/11/2019 1431   HGB 12.4 08/04/2013 1620   HCT 36.2 07/01/2023 0532   HCT 40.2 06/11/2019 1431   PLT 183 07/01/2023 0532   PLT 160 06/11/2019 1431   MCV 91.6 07/01/2023 0532   MCV 93.7 07/20/2019 0950   MCV 91 06/11/2019 1431   MCH 29.1 07/01/2023 0532   MCHC 31.8 07/01/2023 0532   RDW 15.2 07/01/2023 0532   RDW 12.0 06/11/2019 1431   LYMPHSABS 1.8 06/30/2023 1549   LYMPHSABS 1.3 06/11/2019 1431   MONOABS 0.5 06/30/2023 1549   EOSABS 0.1 06/30/2023 1549   EOSABS 0.1 06/11/2019 1431   BASOSABS 0.1 06/30/2023 1549   BASOSABS 0.1 06/11/2019 1431    BMET    Component Value Date/Time   NA 140 06/30/2023 1553   NA 137 07/20/2019 1058   K 4.1 06/30/2023 1553   CL 106 06/30/2023 1553   CO2 21 (L) 06/30/2023 1549   GLUCOSE 138 (H) 06/30/2023 1553   BUN 24 (H) 06/30/2023 1553   BUN 20 07/20/2019 1058   CREATININE 0.70 07/01/2023 0532   CREATININE 0.91 07/11/2015 1517   CALCIUM  9.5 06/30/2023 1549   GFRNONAA >60 07/01/2023 0532   GFRNONAA 68 07/11/2015 1517    IMAGING past 24 hours EEG adult Result Date:  07/01/2023 [name], MD     07/01/2023 10:40 AM Patient Name: JAMISHA HOESCHEN MRN: [id number] Epilepsy Attending: Arlin KIDD Shelton Referring Physician/Provider: everitt Clint Abbey Earle FORBES, NP Date:  07/01/2023 Duration: 22.45 mins Patient history:  68 y.o. female who presents with slurred speech and worsening right-sided weakness. EEG to evaluate for seizure. Level of alertness: Awake AEDs during EEG study: None Technical aspects: This EEG study was done with scalp electrodes positioned according to the 10-20 International system of electrode placement. Electrical activity was reviewed with band pass filter of 1-70Hz , sensitivity of 7 uV/mm, display speed of 58mm/sec with a 60Hz  notched filter applied as appropriate. EEG data were recorded continuously and digitally stored.  Video monitoring was available and reviewed as appropriate. Description: The posterior dominant rhythm consists of 8 Hz activity of moderate voltage (25-35 uV) seen predominantly in posterior head regions, symmetric and reactive to eye opening and eye closing. Physiologic photic driving was not seen during photic stimulation.  Hyperventilation was not performed.   IMPRESSION: This study is within normal limits. No seizures or epileptiform discharges were seen throughout the recording. A normal interictal EEG does not exclude the diagnosis of epilepsy. Arlin KIDD Shelton   MR BRAIN WO CONTRAST Result Date: 06/30/2023 CLINICAL DATA:  Stroke, follow up EXAM: MRI HEAD WITHOUT CONTRAST TECHNIQUE: Multiplanar, multiecho pulse sequences of the brain and surrounding structures were obtained without intravenous contrast. COMPARISON:  CT head from earlier today. FINDINGS: Brain: Many small remote cerebellar infarcts bilaterally. Small remote left basal ganglia and left thalamus lacunar infarcts. Small remote bilateral occipital infarcts. Small remote left posterior insular infarct in bilateral frontal lobes. Patchy T2/FLAIR hyperintensities in the  white matter, compatible with chronic microvascular ischemic disease. No evidence of acute infarct, acute hemorrhage, mass lesion or midline shift. No hydrocephalus. Cerebral atrophy. Vascular: Major arterial flow voids are maintained. Skull and upper cervical spine: Normal marrow signal. Sinuses/Orbits: Negative. IMPRESSION: 1. No evidence of acute intracranial abnormality. 2. Many small remote infarcts and chronic microvascular ischemic disease. Electronically Signed   By: Gilmore GORMAN Molt M.D.   On: 06/30/2023 21:03   CT ANGIO HEAD NECK W WO CM (CODE STROKE) Result Date: 06/30/2023 CLINICAL DATA:  Provided history: Stroke, follow-up. Left facial droop. Right-sided weakness. EXAM: CT ANGIOGRAPHY HEAD AND NECK WITH AND WITHOUT CONTRAST TECHNIQUE: Multidetector CT imaging of the head and neck was performed using the standard protocol during bolus administration of intravenous contrast. Multiplanar CT image reconstructions and MIPs were obtained to evaluate the vascular anatomy. Carotid stenosis measurements (when applicable) are obtained utilizing NASCET criteria, using the distal internal carotid diameter as the denominator. RADIATION DOSE REDUCTION: This exam was performed according to the departmental dose-optimization program which includes automated exposure control, adjustment of the mA and/or kV according to patient size and/or use of iterative reconstruction technique. CONTRAST:  75mL OMNIPAQUE  IOHEXOL  350 MG/ML SOLN COMPARISON:  CT angiogram head/neck 03/07/2023. FINDINGS: CTA NECK FINDINGS Aortic arch: Standard aortic branching. Atherosclerotic plaque within the visible thoracic aorta and proximal major branch vessels of the neck. No hemodynamically significant innominate or proximal subclavian artery stenosis. Right carotid system: CCA and ICA patent within the neck without stenosis. Minimal atherosclerotic plaque about the carotid bifurcation. Left carotid system: CCA and ICA patent within the neck  without stenosis or significant atherosclerotic disease Vertebral arteries: Codominant and patent within the neck without stenosis or significant atherosclerotic disease. Skeleton: Spondylosis at the cervical and visualized upper thoracic levels. No acute fracture or aggressive osseous lesion. Other neck: Subcentimeter thyroid  nodules not meeting consensus criteria for ultrasound follow-up based on size. No follow-up imaging recommended. Reference: J Am Coll Radiol. 2015 Feb;12(2): 143-50. Upper chest: No consolidation within the imaged lung apices. Review of the MIP images confirms the above findings CTA HEAD FINDINGS Anterior circulation: The intracranial internal carotid arteries are patent. Nonstenotic atherosclerotic plaque within both vessels. The M1 middle cerebral arteries are patent. No right M2 proximal branch occlusion or high-grade proximal stenosis. Known severe distal left M2/proximal M3 stenosis (series 12, image 40). The anterior cerebral arteries are patent. No intracranial aneurysm is identified. Posterior circulation: The intracranial vertebral arteries are patent. The basilar artery is patent. The posterior cerebral arteries are patent. Severe stenosis within the right PCA P2 segment, unchanged (series 14, image 22) (series 15, image 24). Venous sinuses: Within the limitations of contrast timing, no convincing thrombus. Anatomic variants: As described. Review of the MIP images confirms the above findings No emergent large vessel occlusion identified. These results were communicated to Dr. Voncile at 4:53 pmon 6/29/2025by text page via the [facility] messaging system. IMPRESSION: CTA neck: The common carotid, internal carotid and vertebral arteries are patent within the neck without stenosis. Minimal atherosclerotic plaque about the right carotid bifurcation. CTA head: 1. No proximal intracranial large vessel  occlusion identified. 2. Intracranial atherosclerotic disease, most notably as follows. Known  severe distal M2/proximal M3 left middle cerebral artery stenosis. Known severe stenosis within the right PCA P2 segment. Electronically Signed   By: Rockey Childs D.O.   On: 06/30/2023 16:54   CT HEAD CODE STROKE WO CONTRAST Result Date: 06/30/2023 CLINICAL DATA:  Code stroke. Neuro deficit, acute, stroke suspected. Left-sided facial droop. Right-sided weakness. EXAM: CT HEAD WITHOUT CONTRAST TECHNIQUE: Contiguous axial images were obtained from the base of the skull through the vertex without intravenous contrast. RADIATION DOSE REDUCTION: This exam was performed according to the departmental dose-optimization program which includes automated exposure control, adjustment of the mA and/or kV according to patient size and/or use of iterative reconstruction technique. COMPARISON:  Brain MRI 03/28/2023. FINDINGS: Brain: Cerebral atrophy. Known chronic cortical/subcortical infarcts within the bilateral frontal lobes/left insula, bilateral parietal lobes and bilateral occipital lobes. Background advanced chronic small vessel ischemic changes within the cerebral white matter. Multiple small chronic infarcts within the bilateral cerebellar hemispheres, not appreciably changed. There is no acute intracranial hemorrhage. No acute demarcated cortical infarct. No extra-axial fluid collection. No evidence of an intracranial mass. No midline shift. Vascular: No hyperdense vessel.  Atherosclerotic calcifications. Skull: No calvarial fracture or aggressive osseous lesion. Sinuses/Orbits: No mass or acute finding within the imaged orbits. No significant paranasal sinus disease at the imaged levels. ASPECTS [facility] Stroke Program Early CT Score) - Ganglionic level infarction (caudate, lentiform nuclei, internal capsule, insula, M1-M3 cortex): 7 - Supraganglionic infarction (M4-M6 cortex): 3 Total score (0-10 with 10 being normal): 10 (when discounting chronic infarcts). No evidence of an acute intracranial abnormality. These  results were communicated to Dr. Voncile at 4:08 pmon 6/29/2025by text page via the [facility] messaging system. IMPRESSION: 1.  No evidence of an acute intracranial abnormality. 2. Parenchymal atrophy, chronic small vessel ischemic disease and chronic infarcts, as described. Electronically Signed   By: Rockey Childs D.O.   On: 06/30/2023 16:08    Vitals:   07/01/23 0800 07/01/23 0900 07/01/23 1100 07/01/23 1136  BP: 136/83 (!) 144/89 136/88   Pulse: 81 82 85   Resp: 13 14 13    Temp:    98.1 F (36.7 C)  TempSrc:    Oral  SpO2: 96% 99% 97%   Weight:      Height:         PHYSICAL EXAM General:  Alert, well-nourished, well-developed patient in no acute distress Psych:  Mood and affect appropriate for situation CV: Regular rate and rhythm on monitor Respiratory:  Regular, unlabored respirations on room air  NEURO:  Mental Status: Alert and oriented to person and place, disoriented to time and gives different history of situation than she gave yesterday Speech/Language: speech is without dysarthria or aphasia.    Cranial Nerves:  II: PERRL. Visual fields full.  III, IV, VI: EOMI. Eyelids elevate symmetrically.  V: Sensation is intact to light touch and symmetrical to face.  VII: Face is symmetrical resting and smiling VIII: hearing intact to voice. IX, X: Phonation is normal.  XII: tongue is midline without fasciculations. Motor: Able to move all 4 extremities with good antigravity strength, right hand grip weaker than left, diminished fine finger movements bilaterally Tone: is normal and bulk is normal Sensation- Intact to light touch bilaterally.  Coordination: FTN intact bilaterally Gait- deferred   ASSESSMENT/PLAN  Ms. ZOEJANE GAULIN is a 68 y.o. female with history of 2 left MCA territory strokes currently participating in the [place] trial, loop recorder  placement, vascular dementia, sleep apnea, hyperlipidemia, hypertension, thrombocytopenia, osteoporosis, depression,  anxiety and anemia admitted for acute onset confusion, gait instability, right sided weakness and right facial droop.  MRI was negative for acute stroke, and EEG was negative for seizure activity.  As MRI was negative, will continue Librexia study medication.  Patient does have a history of memory problems at home, and workup for this was completed in February.  NIH on Admission 5  TIA: Likely left-sided TIA versus complex partial seizure Code Stroke CT head No acute abnormality. Small vessel disease. Atrophy. ASPECTS 10.    CTA head & neck no LVO, severe distal M2/proximal M3 left MCA stenosis, severe stenosis within right PCA P2 segment MRI no acute abnormality, remote infarcts and chronic microvascular ischemic disease 2D Echo pending LDL 99 HgbA1c 5.5 VTE prophylaxis -SCDs aspirin  81 mg daily and Librexia study medication prior to admission, now on aspirin  81 mg daily and Librexia stroke study medication  Therapy recommendations:  SNF Disposition: Pending  Hx of Stroke/TIA Patient has a history of left MCA stroke status post mechanical thrombectomy in 2022 Patient has history of left MCA stroke status post TNK in 3/25  Hypertension Home meds: Losartan  25 mg daily Stable Contain normotension  Hyperlipidemia Home meds: Atorvastatin  40 mg daily, compliance questionable LDL 99, goal < 70 Continue statin at discharge  Other Stroke Risk Factors Advanced age   Other Active Problems None  Hospital day # 0  Patient seen by NP with MD, MD to edit note as needed. Cortney E Everitt Clint Kill , MSN, AGACNP-BC [facility] See Amion for schedule and pager information 07/01/2023 1:16 PM  I have personally obtained history,examined this patient, reviewed notes, independently viewed imaging studies, participated in medical decision making and plan of care.ROS completed by me personally and pertinent positives fully documented  I have made any additions or clarifications directly to  the above note. Agree with note above.  Patient presented with transient confusion and worsening right-sided weakness which appears to have resolved and MRI is negative for acute stroke.  She had somewhat similar episode in the past in 2021 with an abnormal EEG was felt to be a seizure but was put on Keppra  only for short period of time.  Recommend long-term EEG monitoring and if significant focal abnormalities are found may consider putting on Keppra  long-term.  Continue aspirin  and librexia stroke trial medication for now.  Interrogate loop recorder for paroxysmal A-fib.  Aggressive risk factor modification.  Long discussion with patient and her husband over the phone and answered questions.  Discussed with Dr. Jonel. Greater than 50% time during this 50-minute visit was spent in counseling and coordination of care and discussion with patient and care team and answering questions about her presentation and discussion about TIA versus complex partial seizure. Eather Popp, MD Medical Director [facility] Pager: [phone] 07/01/2023 4:07 PM   To contact Stroke Continuity provider, please refer to [email or web address]. After hours, contact General Neurology

## 2023-07-01 NOTE — Progress Notes (Signed)
 LTM maint complete - no skin breakdown under: Fp2 Fp1 Fp2 lead replaced, impedance still high. All  other impedance below 10kOhms.  [facility] monitored, Event button test confirmed by [facility].

## 2023-07-01 NOTE — ED Notes (Signed)
 EEG @ bedside.

## 2023-07-01 NOTE — Progress Notes (Signed)
 EEG complete - results pending

## 2023-07-01 NOTE — Progress Notes (Signed)
  Progress Note   Patient: Jacqueline Bonilla FMW:[id number] DOB: [birth year] DOA: 06/30/2023     0 DOS: the patient was seen and examined on 07/01/2023 at 8:19AM      Brief hospital course: 68 yo F with history stroke, HLD, vascular dementia, lives with husband, who presented with acute confusion, ataxia, facial droop and right sided weakness.  In the ER, MRI negative.     Assessment and Plan: TIA vs MRI-negative stroke CTH normal.  MRB no acute abnormality.  Echo pending  CTA with severe multivessel disease.  LDL 99.  A1c 5.5%  EEG unremarkable - Continue aspirin  and Librexia study medication   Hypertension - Continue losartan   Vascular dementia Lives with husband - PT eval  Hyperlipidemia - Continue LIpitor          Subjective: Feeling weak.  Poor historian.  No nursing concerns.     Physical Exam: BP (!) 151/84 (BP Location: Right Arm)   Pulse 85   Temp 98.6 F (37 C) (Oral)   Resp 16   Ht 5' 4 (1.626 m)   Wt 53.1 kg   LMP 01/02/2003   SpO2 98%   BMI 20.08 kg/m   Thin elderly female, lying in bed, no acute distress RRR no murmurs, no LE edema Respiratory rate normal, lungs clear without rales or wheezes Abdomen soft, no TTP Attention diminished, affect blunted, memory appears impaired.  She has right sided weakness and some contractures of the right arm.  Both legs considerably weak.    Data Reviewed: BMP unremarkabl CBC shows mild anemia     Disposition: Status is: Observation         Author: Lonni SHAUNNA Dalton, MD 07/01/2023 5:37 PM  For on call review [email or web address].

## 2023-07-01 NOTE — Evaluation (Signed)
 Occupational Therapy Evaluation Patient Details Name: Jacqueline Bonilla MRN: [id number] DOB: [birth year] Today's Date: 07/01/2023   History of Present Illness   Jacqueline Bonilla is a 68 y.o. female admitted 6/29   with slurred speech and worsening right-sided weakness.  Patient had sudden onset of symptoms.  Code stroke was initiated.  Symptoms largely resolved although she still has some residual weakness.  Family says she has been dizzy and falling at home.  MRI revealed small remote infarcts.  PMH: CVA, hyperlipidemia, normocytic anemia, vascular dementia, essential hypertension, statin intolerance, gait disturbance, thrombocytopenia     Clinical Impressions PTA, pt lived with husband who assisted with ADL and IADL when not at work. Per pt she is ambulatory without assist. Has aide 2x/week to assist with chores and bathing. Upon eval, pt with RUE weakness, decreased balance, safety, awareness, and cognition. Pt needing up to min-mod A for all OOB ADL and transfers. Pt reports R visual field cut at baseline. Will continue to follow acutely. Due to significant change in functional status, and need for assist with all OOB mobility, recommend discharge at inpatient rehab <3 hours.      If plan is discharge home, recommend the following:   A lot of help with walking and/or transfers;A lot of help with bathing/dressing/bathroom;Assistance with cooking/housework;Assist for transportation;Help with stairs or ramp for entrance;Direct supervision/assist for financial management;Direct supervision/assist for medications management;Assistance with feeding     Functional Status Assessment   Patient has had a recent decline in their functional status and/or demonstrates limited ability to make significant improvements in function in a reasonable and predictable amount of time     Equipment Recommendations   Other (comment) (defer)     Recommendations for Other Services          Precautions/Restrictions   Precautions Precautions: Fall Recall of Precautions/Restrictions: Intact Restrictions Weight Bearing Restrictions Per Provider Order: No     Mobility Bed Mobility Overal bed mobility: Needs Assistance Bed Mobility: Rolling, Sidelying to Sit Rolling: Min assist Sidelying to sit: Min assist       General bed mobility comments: Assist to roll with pt leaning posterior as she tries to come to EOB therefore needing LE and truncal assist.    Transfers Overall transfer level: Needs assistance   Transfers: Sit to/from Stand, Bed to chair/wheelchair/BSC Sit to Stand: From elevated surface, Min assist Stand pivot transfers: Min assist, Mod assist         General transfer comment: Pt needed mod assist to stand needing assist of her left UE as she has difficulty using right UE due to previous CVA.  Pt able to stand and turn to 3N1 however needed mod assist and max cueing with pt leaning posteriorly and was off balance needing incr assist to sit on 3N1.  Needed total assist to be cleaned.      Balance Overall balance assessment: Needs assistance Sitting-balance support: Single extremity supported, Feet supported Sitting balance-Leahy Scale: Poor Sitting balance - Comments: leans posteriorly and loses balance wihtout constant support   Standing balance support: Single extremity supported, During functional activity Standing balance-Leahy Scale: Poor Standing balance comment: loses balance posteriorly and needs constant min-mod assist external support and left UE support                           ADL either performed or assessed with clinical judgement   ADL Overall ADL's : Needs assistance/impaired Eating/Feeding: Minimal assistance;Sitting   Grooming:  Minimal assistance;Bed level Grooming Details (indicate cue type and reason): with HOB elevated; min A for problem solving and dinning toothpaste on brush Upper Body Bathing: Minimal  assistance   Lower Body Bathing: Moderate assistance   Upper Body Dressing : Minimal assistance   Lower Body Dressing: Moderate assistance;Sit to/from stand   Toilet Transfer: Minimal assistance;Stand-pivot;BSC/3in1;Moderate assistance           Functional mobility during ADLs: Minimal assistance;Moderate assistance       Vision Ability to See in Adequate Light: 3 Highly impaired Patient Visual Report: Other (comment) (R hemianopsia at baseline since MCA) Vision Assessment?: Vision impaired- to be further tested in functional context Additional Comments: R visual field deficit at baseline. Pt compensates fairly well with compensatory scanning and head turns     Perception         Praxis         Pertinent Vitals/Pain Pain Assessment Pain Assessment: Faces Faces Pain Scale: Hurts even more Pain Location: back- chronic per pt Pain Descriptors / Indicators: Aching, Discomfort, Grimacing, Guarding Pain Intervention(s): Limited activity within patient's tolerance, Monitored during session     Extremity/Trunk Assessment Upper Extremity Assessment Upper Extremity Assessment: Generalized weakness;RUE deficits/detail RUE Deficits / Details: RUE tremor, decr initiation, proprioception, coordination at baseline per report this is no different. does use as functional helper hand given increased time RUE Sensation: decreased light touch;decreased proprioception RUE Coordination: decreased fine motor;decreased gross motor   Lower Extremity Assessment Lower Extremity Assessment: Defer to PT evaluation   Cervical / Trunk Assessment Cervical / Trunk Assessment: Kyphotic   Communication Communication Communication: Impaired Factors Affecting Communication: Reduced clarity of speech   Cognition Arousal: Alert Behavior During Therapy: Flat affect, Impulsive Cognition: Cognition impaired   Orientation impairments:  (time not formally assessed) Awareness: Intellectual awareness  intact, Online awareness impaired Memory impairment (select all impairments): Short-term memory, Working [employer] fast streamer, Conservation officer, historic buildings Attention impairment (select first level of impairment): Sustained attention Executive functioning impairment (select all impairments): Initiation, Organization, Sequencing, Reasoning, Problem solving OT - Cognition Comments: pt reports being forgetful since MCA CVA in the past; receives assist with IADL at baseline. Decr awareness and communication needing increased time to report needs. Pt ambulatory to sink for oral care then needing to sit to ambulates back to bed. after return to supine, then recognizes she needs to use restroom. After back to bed second time, pt asks to brush teeth despite having already been offered when first lying back down and responding no                 Following commands: Intact       Cueing  General Comments   Cueing Techniques: Verbal cues;Tactile cues      Exercises     Shoulder Instructions      Home Living Family/patient expects to be discharged to:: Private residence Living Arrangements: Spouse/significant other Available Help at Discharge: Family;Available 24 hours/day Type of Home: House Home Access: Stairs to enter [employer] of Steps: 5 Entrance Stairs-Rails: Right Home Layout: One level     Bathroom Shower/Tub: Producer, television/film/video: Handicapped height     Home Equipment: Cane - single point;Shower seat   Additional Comments: Aide 2 x week and assists with shower. Husband cleans and cooks but does work full time.      Prior Functioning/Environment Prior Level of Function : History of Falls (last six months)             Mobility  Comments: Pt reports she enjoys walking at baseline with cane. ADLs Comments: spouse provides supervision for all adls per spouse in the past 4 years. spouse does all IADL. meal prep    OT Problem List: Decreased  strength;Impaired balance (sitting and/or standing);Decreased activity tolerance;Impaired vision/perception;Decreased coordination;Decreased cognition;Decreased safety awareness;Impaired sensation;Decreased knowledge of use of DME or AE;Impaired UE functional use   OT Treatment/Interventions: Self-care/ADL training;Therapeutic exercise;DME and/or AE instruction;Therapeutic activities;Patient/family education;Balance training      OT Goals(Current goals can be found in the care plan section)   Acute Rehab OT Goals Patient Stated Goal: get better OT Goal Formulation: With patient Time For Goal Achievement: 07/15/23 Potential to Achieve Goals: Good   OT Frequency:  Min 2X/week    Co-evaluation              AM-PAC OT 6 Clicks Daily Activity     Outcome Measure Help from another person eating meals?: A Little Help from another person taking care of personal grooming?: A Little Help from another person toileting, which includes using toliet, bedpan, or urinal?: A Lot Help from another person bathing (including washing, rinsing, drying)?: A Lot Help from another person to put on and taking off regular upper body clothing?: A Little Help from another person to put on and taking off regular lower body clothing?: A Lot 6 Click Score: 15   End of Session Equipment Utilized During Treatment: Gait belt Nurse Communication: Mobility status  Activity Tolerance: Patient tolerated treatment well Patient left: in bed;with call bell/phone within reach  OT Visit Diagnosis: Unsteadiness on feet (R26.81);Muscle weakness (generalized) (M62.81);Other symptoms and signs involving cognitive function;Low vision, both eyes (H54.2);Hemiplegia and hemiparesis Hemiplegia - Right/Left: Right 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

## 2023-07-01 NOTE — TOC CAGE-AID Note (Signed)
 Transition of Care [facility]) - CAGE-AID Screening   Patient Details  Name: Jacqueline Bonilla MRN: [id number] Date of Birth: 21-Oct-1955  Transition of Care [facility]) CM/SW Contact:    Smt Lokey E Michal Strzelecki, LCSW Phone Number: 07/01/2023, 10:59 AM   Clinical Narrative:    CAGE-AID Screening:    Have You Ever Felt You Ought to Cut Down on Your Drinking or Drug Use?: No Have People Annoyed You By [employer] Your Drinking Or Drug Use?: No Have You Felt Bad Or Guilty About Your Drinking Or Drug Use?: No Have You Ever Had a Drink or Used Drugs First Thing In The Morning to Steady Your Nerves or to Get Rid of a Hangover?: No CAGE-AID Score: 0  Substance Abuse Education Offered: No

## 2023-07-01 NOTE — Progress Notes (Signed)
 vLTM setup  All impedances below 10k.  [facility] not monitoring   ED patient

## 2023-07-01 NOTE — ED Notes (Signed)
Assisted pt with bedside commode.

## 2023-07-01 NOTE — ED Notes (Signed)
Patient assisted to use bedside commode 

## 2023-07-01 NOTE — Procedures (Signed)
 Patient Name: Jacqueline Bonilla  MRN: [id number]  Epilepsy Attending: Arlin MALVA Krebs  Referring Physician/Provider: everitt Clint Abbey Earle FORBES, NP  Date:  07/01/2023  Duration: 22.45 mins  Patient history:  68 y.o. female who presents with slurred speech and worsening right-sided weakness. EEG to evaluate for seizure.  Level of alertness: Awake  AEDs during EEG study: None  Technical aspects: This EEG study was done with scalp electrodes positioned according to the 10-20 International system of electrode placement. Electrical activity was reviewed with band pass filter of 1-70Hz , sensitivity of 7 uV/mm, display speed of 63mm/sec with a 60Hz  notched filter applied as appropriate. EEG data were recorded continuously and digitally stored.  Video monitoring was available and reviewed as appropriate.  Description: The posterior dominant rhythm consists of 8 Hz activity of moderate voltage (25-35 uV) seen predominantly in posterior head regions, symmetric and reactive to eye opening and eye closing. Physiologic photic driving was not seen during photic stimulation.  Hyperventilation was not performed.     IMPRESSION: This study is within normal limits. No seizures or epileptiform discharges were seen throughout the recording.  A normal interictal EEG does not exclude the diagnosis of epilepsy.  Kattaleya Alia O Maize Brittingham

## 2023-07-01 NOTE — Evaluation (Signed)
 Physical Therapy Evaluation Patient Details Name: Jacqueline Bonilla MRN: [id number] DOB: 03/20/1955 Today's Date: 07/01/2023  History of Present Illness  Jacqueline Bonilla is a 68 y.o. female admitted 6/29   with slurred speech and worsening right-sided weakness.  Patient had sudden onset of symptoms.  Code stroke was initiated.  Symptoms largely resolved although she still has some residual weakness.  Family says she has been dizzy and falling at home.  MRI revealed small remote infarcts.  PMH: CVA, hyperlipidemia, normocytic anemia, vascular dementia, essential hypertension, statin intolerance, gait disturbance, thrombocytopenia  Clinical Impression  Pt admitted with above diagnosis. Pt needing min to mod assist for bed mobility and transfers.  Pt with significant posterior lean and poor balance reactions. Will benefit from post acute rehab < 3 hours day as pts husband works full time and pt is left alone much of the day. Will follow acutely.  Pt currently with functional limitations due to the deficits listed below (see PT Problem List). Pt will benefit from acute skilled PT to increase their independence and safety with mobility to allow discharge.           If plan is discharge home, recommend the following: A lot of help with walking and/or transfers;A lot of help with bathing/dressing/bathroom;Assistance with cooking/housework;Assist for transportation;Help with stairs or ramp for entrance;Supervision due to cognitive status   Can travel by private vehicle   No    Equipment Recommendations Wheelchair (measurements PT);Wheelchair cushion (measurements PT);Hospital bed  Recommendations for Other Services       Functional Status Assessment Patient has had a recent decline in their functional status and demonstrates the ability to make significant improvements in function in a reasonable and predictable amount of time.     Precautions / Restrictions Precautions Precautions:  Fall Recall of Precautions/Restrictions: Intact Restrictions Weight Bearing Restrictions Per Provider Order: No      Mobility  Bed Mobility Overal bed mobility: Needs Assistance Bed Mobility: Rolling, Sidelying to Sit Rolling: Min assist Sidelying to sit: Min assist       General bed mobility comments: Assist to roll with pt leaning posterior as she tries to come to EOB therefore needing LE and truncal assist.    Transfers Overall transfer level: Needs assistance   Transfers: Sit to/from Stand, Bed to chair/wheelchair/BSC Sit to Stand: Mod assist, From elevated surface Stand pivot transfers: Mod assist         General transfer comment: Pt needed mod assist to stand needing assist of her left UE as she has difficulty using right UE due to previous CVA.  Pt able to stand and turn to 3N1 however needed mod assist and max cueing with pt leaning posteriorly and was off balance needing incr assist to sit on 3N1.  Needed total assist to be cleaned.    Ambulation/Gait Ambulation/Gait assistance: Mod assist Gait Distance (Feet): 5 Feet Assistive device: 1 person hand held assist Gait Pattern/deviations: Step-to pattern, Decreased step length - right, Decreased step length - left, Shuffle, Ataxic, Leaning posteriorly, Staggering left, Staggering right, Wide base of support   Gait velocity interpretation: <1.31 ft/sec, indicative of household ambulator   General Gait Details: Pt took a few steps forward needing cues for sequencing steps.  Pt with left UE support by PT.  Pt needing mod assist as she leans posteriorly and doesnt correct without incr assist. Had difficulty backing up to stretcher to sit.  Stairs            Psychologist, prison and probation services  Tilt Bed    Modified Rankin (Stroke Patients Only)       Balance Overall balance assessment: Needs assistance Sitting-balance support: Single extremity supported, Feet supported Sitting balance-Leahy Scale: Poor Sitting  balance - Comments: leans posteriorly and loses balance wihtout constant support   Standing balance support: Single extremity supported, During functional activity Standing balance-Leahy Scale: Poor Standing balance comment: loses balance posteriorly and needs constant mod assist external support and left UE support                             Pertinent Vitals/Pain Pain Assessment Pain Assessment: Faces Faces Pain Scale: Hurts even more Pain Location: back- chronic per pt Pain Descriptors / Indicators: Aching, Discomfort, Grimacing, Guarding Pain Intervention(s): Limited activity within patient's tolerance, Monitored during session, Repositioned    Home Living Family/patient expects to be discharged to:: Private residence Living Arrangements: Spouse/significant other Available Help at Discharge: Family;Available 24 hours/day Type of Home: House Home Access: Stairs to enter Entrance Stairs-Rails: Right Entrance Stairs-Number of Steps: 5   Home Layout: One level Home Equipment: Cane - single point;Shower seat Additional Comments: Aide 2 x week and assists with shower. Husband cleans and cooks but does work full time.    Prior Function Prior Level of Function : History of Falls (last six months)             Mobility Comments: Pt reports she enjoys walking at baseline with cane. ADLs Comments: spouse provides supervision for all adls per spouse in the past 4 years     Extremity/Trunk Assessment   Upper Extremity Assessment Upper Extremity Assessment: Defer to OT evaluation    Lower Extremity Assessment Lower Extremity Assessment: Generalized weakness    Cervical / Trunk Assessment Cervical / Trunk Assessment: Kyphotic  Communication   Communication Communication: Impaired Factors Affecting Communication: Reduced clarity of speech    Cognition Arousal: Alert Behavior During Therapy: Flat affect, Impulsive   PT - Cognitive impairments: Awareness,  Attention, Initiation, Sequencing, Problem solving, Safety/Judgement                         Following commands: Intact       Cueing Cueing Techniques: Verbal cues, Tactile cues     General Comments General comments (skin integrity, edema, etc.): 86 bpm, 100% RA, 151/84    Exercises     Assessment/Plan    PT Assessment Patient needs continued PT services  PT Problem List Decreased activity tolerance;Decreased balance;Decreased mobility;Decreased safety awareness;Decreased knowledge of use of DME       PT Treatment Interventions DME instruction;Gait training;Functional mobility training;Therapeutic activities;Therapeutic exercise;Balance training;Patient/family education;Stair training;Wheelchair mobility training    PT Goals (Current goals can be found in the Care Plan section)  Acute Rehab PT Goals Patient Stated Goal: to go to rehab PT Goal Formulation: With patient Time For Goal Achievement: 07/15/23 Potential to Achieve Goals: Fair    Frequency Min 2X/week     Co-evaluation               AM-PAC PT 6 Clicks Mobility  Outcome Measure Help needed turning from your back to your side while in a flat bed without using bedrails?: A Little Help needed moving from lying on your back to sitting on the side of a flat bed without using bedrails?: A Little Help needed moving to and from a bed to a chair (including a wheelchair)?: A Lot Help needed standing  up from a chair using your arms (e.g., wheelchair or bedside chair)?: A Lot Help needed to walk in hospital room?: A Lot Help needed climbing 3-5 steps with a railing? : Total 6 Click Score: 13    End of Session Equipment Utilized During Treatment: Gait belt Activity Tolerance: Patient tolerated treatment well Patient left: with call bell/phone within reach (on stretcher) Nurse Communication: Mobility status (Notified nurse that PT set pt up for breakfast and to please check her and put side rail back up  after she eats) PT Visit Diagnosis: Muscle weakness (generalized) (M62.81);Unsteadiness on feet (R26.81)    Time: 9074-9053 PT Time Calculation (min) (ACUTE ONLY): 21 min   Charges:   PT Evaluation $PT Eval Moderate Complexity: 1 Mod   PT General Charges $$ ACUTE PT VISIT: 1 Visit         Aashir Umholtz M,PT Acute Rehab Services [phone]   Stephane JULIANNA Bevel 07/01/2023, 11:04 AM

## 2023-07-01 NOTE — ED Notes (Signed)
 Called CCMD to add to cardiac monitoring

## 2023-07-01 NOTE — Progress Notes (Signed)
   07/01/23 1638  Vitals  Temp 98.6 F (37 C)  Temp Source Oral  BP (!) 151/84  MAP (mmHg) 104  BP Location Right Arm  BP Method Automatic  Patient Position (if appropriate) Lying  Pulse Rate 85  Pulse Rate Source Monitor  ECG Heart Rate 88  Resp 16  Level of Consciousness  Level of Consciousness Alert  MEWS COLOR  MEWS Score Color Green  Oxygen Therapy  SpO2 98 %  O2 Device Room Air  Pain Assessment  Pain Scale 0-10  Pain Score 0  MEWS Score  MEWS Temp 0  MEWS Systolic 0  MEWS Pulse 0  MEWS RR 0  MEWS LOC 0  MEWS Score 0   Patient arrived onto unit via transport stretcher from the ED. RN assisted patient to bathroom and then to bed. Tele monitor applied and CCMD notified. VS obtained and stable. Call bell given and RN ordered patient's dinner. Family waiting in waiting area.

## 2023-07-01 NOTE — Plan of Care (Signed)
  Problem: Self-Care: Goal: Ability to participate in self-care as condition permits will improve Outcome: Progressing Goal: Verbalization of feelings and concerns over difficulty with self-care will improve Outcome: Progressing   Problem: Nutrition: Goal: Risk of aspiration will decrease Outcome: Progressing

## 2023-07-02 ENCOUNTER — Observation Stay (HOSPITAL_COMMUNITY)

## 2023-07-02 ENCOUNTER — Encounter: Payer: Self-pay | Admitting: Occupational Therapy

## 2023-07-02 ENCOUNTER — Observation Stay (HOSPITAL_BASED_OUTPATIENT_CLINIC_OR_DEPARTMENT_OTHER)

## 2023-07-02 ENCOUNTER — Inpatient Hospital Stay (HOSPITAL_COMMUNITY)

## 2023-07-02 ENCOUNTER — Ambulatory Visit: Admitting: Physical Therapy

## 2023-07-02 DIAGNOSIS — R569 Unspecified convulsions: Secondary | ICD-10-CM | POA: Diagnosis present

## 2023-07-02 DIAGNOSIS — F32A Depression, unspecified: Secondary | ICD-10-CM | POA: Diagnosis present

## 2023-07-02 DIAGNOSIS — Z8249 Family history of ischemic heart disease and other diseases of the circulatory system: Secondary | ICD-10-CM | POA: Diagnosis not present

## 2023-07-02 DIAGNOSIS — R41842 Visuospatial deficit: Secondary | ICD-10-CM | POA: Diagnosis present

## 2023-07-02 DIAGNOSIS — R269 Unspecified abnormalities of gait and mobility: Secondary | ICD-10-CM | POA: Diagnosis not present

## 2023-07-02 DIAGNOSIS — I739 Peripheral vascular disease, unspecified: Secondary | ICD-10-CM

## 2023-07-02 DIAGNOSIS — G4089 Other seizures: Principal | ICD-10-CM

## 2023-07-02 DIAGNOSIS — G459 Transient cerebral ischemic attack, unspecified: Secondary | ICD-10-CM | POA: Diagnosis not present

## 2023-07-02 DIAGNOSIS — F0390 Unspecified dementia without behavioral disturbance: Secondary | ICD-10-CM | POA: Diagnosis not present

## 2023-07-02 DIAGNOSIS — G458 Other transient cerebral ischemic attacks and related syndromes: Secondary | ICD-10-CM | POA: Diagnosis not present

## 2023-07-02 DIAGNOSIS — M532X6 Spinal instabilities, lumbar region: Secondary | ICD-10-CM | POA: Diagnosis not present

## 2023-07-02 DIAGNOSIS — Z888 Allergy status to other drugs, medicaments and biological substances status: Secondary | ICD-10-CM | POA: Diagnosis not present

## 2023-07-02 DIAGNOSIS — R29810 Facial weakness: Secondary | ICD-10-CM | POA: Diagnosis present

## 2023-07-02 DIAGNOSIS — R270 Ataxia, unspecified: Secondary | ICD-10-CM | POA: Diagnosis present

## 2023-07-02 DIAGNOSIS — R2681 Unsteadiness on feet: Secondary | ICD-10-CM | POA: Diagnosis present

## 2023-07-02 DIAGNOSIS — M545 Low back pain, unspecified: Secondary | ICD-10-CM | POA: Diagnosis not present

## 2023-07-02 DIAGNOSIS — Z91013 Allergy to seafood: Secondary | ICD-10-CM | POA: Diagnosis not present

## 2023-07-02 DIAGNOSIS — D649 Anemia, unspecified: Secondary | ICD-10-CM | POA: Diagnosis present

## 2023-07-02 DIAGNOSIS — Z79899 Other long term (current) drug therapy: Secondary | ICD-10-CM | POA: Diagnosis not present

## 2023-07-02 DIAGNOSIS — I69390 Apraxia following cerebral infarction: Secondary | ICD-10-CM | POA: Diagnosis not present

## 2023-07-02 DIAGNOSIS — Z7401 Bed confinement status: Secondary | ICD-10-CM | POA: Diagnosis not present

## 2023-07-02 DIAGNOSIS — R296 Repeated falls: Secondary | ICD-10-CM | POA: Diagnosis present

## 2023-07-02 DIAGNOSIS — M810 Age-related osteoporosis without current pathological fracture: Secondary | ICD-10-CM | POA: Diagnosis present

## 2023-07-02 DIAGNOSIS — F0154 Vascular dementia, unspecified severity, with anxiety: Secondary | ICD-10-CM | POA: Diagnosis present

## 2023-07-02 DIAGNOSIS — E785 Hyperlipidemia, unspecified: Secondary | ICD-10-CM | POA: Diagnosis present

## 2023-07-02 DIAGNOSIS — F0150 Vascular dementia without behavioral disturbance: Secondary | ICD-10-CM | POA: Diagnosis not present

## 2023-07-02 DIAGNOSIS — G8191 Hemiplegia, unspecified affecting right dominant side: Secondary | ICD-10-CM | POA: Diagnosis present

## 2023-07-02 DIAGNOSIS — F0153 Vascular dementia, unspecified severity, with mood disturbance: Secondary | ICD-10-CM | POA: Diagnosis present

## 2023-07-02 DIAGNOSIS — Z91041 Radiographic dye allergy status: Secondary | ICD-10-CM | POA: Diagnosis not present

## 2023-07-02 DIAGNOSIS — I10 Essential (primary) hypertension: Secondary | ICD-10-CM | POA: Diagnosis present

## 2023-07-02 DIAGNOSIS — R9401 Abnormal electroencephalogram [EEG]: Secondary | ICD-10-CM | POA: Diagnosis present

## 2023-07-02 DIAGNOSIS — I480 Paroxysmal atrial fibrillation: Secondary | ICD-10-CM | POA: Diagnosis present

## 2023-07-02 DIAGNOSIS — I69351 Hemiplegia and hemiparesis following cerebral infarction affecting right dominant side: Secondary | ICD-10-CM | POA: Diagnosis not present

## 2023-07-02 DIAGNOSIS — M47814 Spondylosis without myelopathy or radiculopathy, thoracic region: Secondary | ICD-10-CM | POA: Diagnosis not present

## 2023-07-02 DIAGNOSIS — G4730 Sleep apnea, unspecified: Secondary | ICD-10-CM | POA: Diagnosis present

## 2023-07-02 DIAGNOSIS — Z881 Allergy status to other antibiotic agents status: Secondary | ICD-10-CM | POA: Diagnosis not present

## 2023-07-02 LAB — ECHOCARDIOGRAM COMPLETE
Area-P 1/2: 3.65 cm2
Height: 64 in
Weight: 1872 [oz_av]

## 2023-07-02 LAB — CBC
HCT: 38.7 % (ref 36.0–46.0)
Hemoglobin: 12.8 g/dL (ref 12.0–15.0)
MCH: 29.2 pg (ref 26.0–34.0)
MCHC: 33.1 g/dL (ref 30.0–36.0)
MCV: 88.4 fL (ref 80.0–100.0)
Platelets: 195 10*3/uL (ref 150–400)
RBC: 4.38 MIL/uL (ref 3.87–5.11)
RDW: 15 % (ref 11.5–15.5)
WBC: 5.6 10*3/uL (ref 4.0–10.5)
nRBC: 0 % (ref 0.0–0.2)

## 2023-07-02 LAB — BASIC METABOLIC PANEL WITH GFR
Anion gap: 11 (ref 5–15)
BUN: 14 mg/dL (ref 8–23)
CO2: 24 mmol/L (ref 22–32)
Calcium: 9.3 mg/dL (ref 8.9–10.3)
Chloride: 101 mmol/L (ref 98–111)
Creatinine, Ser: 0.67 mg/dL (ref 0.44–1.00)
GFR, Estimated: >60 mL/min (ref >60)
Glucose, Bld: 101 mg/dL — ABNORMAL HIGH (ref 70–99)
Potassium: 3.6 mmol/L (ref 3.5–5.1)
Sodium: 136 mmol/L (ref 135–145)

## 2023-07-02 MED ORDER — LEVETIRACETAM ER 500 MG PO TB24: 500.0000 mg | ORAL_TABLET | Freq: Every day | ORAL | Status: DC

## 2023-07-02 MED ORDER — LEVETIRACETAM ER 500 MG PO TB24
500.0000 mg | ORAL_TABLET | Freq: Every day | ORAL | Status: DC
  Filled 2023-07-02: qty 1

## 2023-07-02 MED ORDER — DIVALPROEX SODIUM ER 500 MG PO TB24: 500.0000 mg | ORAL_TABLET | Freq: Every day | ORAL | Status: DC

## 2023-07-02 NOTE — Procedures (Addendum)
 Patient Name: Jacqueline Bonilla  MRN: [id number]  Epilepsy Attending: Arlin MALVA Krebs  Referring Physician/Provider: Rosemarie Eather RAMAN, MD  Duration: 07/01/2023 1344 to 07/02/2023 1555   Patient history:  68 y.o. female who presents with slurred speech and worsening right-sided weakness. EEG to evaluate for seizure.   Level of alertness: Awake, asleep   AEDs during EEG study: None   Technical aspects: This EEG study was done with scalp electrodes positioned according to the 10-20 International system of electrode placement. Electrical activity was reviewed with band pass filter of 1-70Hz , sensitivity of 7 uV/mm, display speed of 51mm/sec with a 60Hz  notched filter applied as appropriate. EEG data were recorded continuously and digitally stored.  Video monitoring was available and reviewed as appropriate.   Description: The posterior dominant rhythm consists of 8 Hz activity of moderate voltage (25-35 uV) seen predominantly in posterior head regions, symmetric and reactive to eye opening and eye closing.  Sleep was characterized by vertex waves, sleep spindles (12-14hz ), maximal fronto-central region.    IMPRESSION: This study is within normal limits. No seizures or epileptiform discharges were seen throughout the recording.   A normal interictal EEG does not exclude the diagnosis of epilepsy.   [name] O Tonesha Tsou

## 2023-07-02 NOTE — Therapy (Signed)
 [facility] [address] [place], [name], [address] Phone: [phone]   Fax:  [phone]  Patient Details  Name: Jacqueline Bonilla MRN: [id number] Date of Birth: [birth year]  Encounter Date: 07/02/2023  OCCUPATIONAL THERAPY DISCHARGE SUMMARY  Visits from Start of Care: 1 - initial eval and pt/caregiver education  Current functional level related to goals / functional outcomes: Pt has not met any goals due to hospitalization at this time.  Remaining deficits: Pt has same functional deficits and potentially more deficits s/p TIA/hospitalization.   Education / Equipment: Pt needs re-evaluation with additional therapeutic interventions, resources, HEP and ADL compensatory education upon DC from the hospitalization.   Patient discharged from outpatient occupational therapy at this time due to hospitalization.     Clarita LITTIE Pride, OT 07/02/2023, 8:13 AM

## 2023-07-02 NOTE — Progress Notes (Signed)
 LTM maint complete - no skin breakdown under: QE8,Q2,Q1

## 2023-07-02 NOTE — Plan of Care (Signed)
  Problem: Coping: Goal: Will verbalize positive feelings about self Outcome: Progressing   Problem: Nutrition: Goal: Risk of aspiration will decrease Outcome: Progressing   Problem: Education: Goal: Knowledge of General Education information will improve Description: Including pain rating scale, medication(s)/side effects and non-pharmacologic comfort measures Outcome: Progressing   Problem: Activity: Goal: Risk for activity intolerance will decrease Outcome: Progressing   Problem: Nutrition: Goal: Adequate nutrition will be maintained Outcome: Progressing

## 2023-07-02 NOTE — Progress Notes (Signed)
 Report given to [facility], LPN at [facility].

## 2023-07-02 NOTE — Progress Notes (Signed)
 STROKE TEAM PROGRESS NOTE    INTERIM HISTORY/SUBJECTIVE  Patient is seen in her room with her husband at the bedside.  She remains hemodynamically stable and afebrile and has had no reoccurrence of symptoms. Long-term overnight EEG is normal.  However EEG on 12/02/2020 showed continuous 3 to 6 Hz theta-delta slowing in left frontotemporal region. Frequent spikes were also noted in left frontotemporal region.  Patient was put on Keppra  for short period after that and discontinued OBJECTIVE  CBC    Component Value Date/Time   WBC 5.6 07/02/2023 0441   RBC 4.38 07/02/2023 0441   HGB 12.8 07/02/2023 0441   HGB 13.3 06/11/2019 1431   HGB 12.4 08/04/2013 1620   HCT 38.7 07/02/2023 0441   HCT 40.2 06/11/2019 1431   PLT 195 07/02/2023 0441   PLT 160 06/11/2019 1431   MCV 88.4 07/02/2023 0441   MCV 93.7 07/20/2019 0950   MCV 91 06/11/2019 1431   MCH 29.2 07/02/2023 0441   MCHC 33.1 07/02/2023 0441   RDW 15.0 07/02/2023 0441   RDW 12.0 06/11/2019 1431   LYMPHSABS 1.8 06/30/2023 1549   LYMPHSABS 1.3 06/11/2019 1431   MONOABS 0.5 06/30/2023 1549   EOSABS 0.1 06/30/2023 1549   EOSABS 0.1 06/11/2019 1431   BASOSABS 0.1 06/30/2023 1549   BASOSABS 0.1 06/11/2019 1431    BMET    Component Value Date/Time   NA 136 07/02/2023 0441   NA 137 07/20/2019 1058   K 3.6 07/02/2023 0441   CL 101 07/02/2023 0441   CO2 24 07/02/2023 0441   GLUCOSE 101 (H) 07/02/2023 0441   BUN 14 07/02/2023 0441   BUN 20 07/20/2019 1058   CREATININE 0.67 07/02/2023 0441   CREATININE 0.91 07/11/2015 1517   CALCIUM  9.3 07/02/2023 0441   GFRNONAA >60 07/02/2023 0441   GFRNONAA 68 07/11/2015 1517    IMAGING past 24 hours Overnight EEG with video Result Date: 07/02/2023 [name], MD     07/02/2023 10:04 AM Patient Name: Jacqueline Bonilla MRN: [id number] Epilepsy Attending: Arlin KIDD Shelton Referring Physician/Provider: Rosemarie Eather RAMAN, MD Duration: 07/01/2023 1344 to 07/02/2023 1000  Patient history:  68  y.o. female who presents with slurred speech and worsening right-sided weakness. EEG to evaluate for seizure.  Level of alertness: Awake, asleep  AEDs during EEG study: None  Technical aspects: This EEG study was done with scalp electrodes positioned according to the 10-20 International system of electrode placement. Electrical activity was reviewed with band pass filter of 1-70Hz , sensitivity of 7 uV/mm, display speed of 86mm/sec with a 60Hz  notched filter applied as appropriate. EEG data were recorded continuously and digitally stored.  Video monitoring was available and reviewed as appropriate.  Description: The posterior dominant rhythm consists of 8 Hz activity of moderate voltage (25-35 uV) seen predominantly in posterior head regions, symmetric and reactive to eye opening and eye closing.  Sleep was characterized by vertex waves, sleep spindles (12-14hz ), maximal fronto-central region.  IMPRESSION: This study is within normal limits. No seizures or epileptiform discharges were seen throughout the recording.  A normal interictal EEG does not exclude the diagnosis of epilepsy.  Arlin KIDD Shelton    ECHOCARDIOGRAM COMPLETE Result Date: 07/02/2023    ECHOCARDIOGRAM REPORT   Patient Name:   Jacqueline Bonilla Raj Date of Exam: 07/02/2023 Medical Rec #:  [id number]          Height:       64.0 in Accession #:    [id number]  Weight:       117.0 lb Date of Birth:  09-09-1955          BSA:          1.558 m Patient Age:    68 years           BP:           128/85 mmHg Patient Gender: F                  HR:           59 bpm. Exam Location:  Inpatient Procedure: 2D Echo, Color Doppler and Cardiac Doppler (Both Spectral and Color            Flow Doppler were utilized during procedure). Indications:    Stroke  History:        Patient has prior history of Echocardiogram examinations, most                 recent 03/27/2023. Risk Factors:Hypertension and Dyslipidemia.  Sonographer:    Philomena Daring Referring Phys: [id number]  CHRISTOPHER P DANFORD  Sonographer Comments: Technically difficult study due to poor echo windows and no subcostal window. Image acquisition challenging due to patient body habitus. IMPRESSIONS  1. Left ventricular ejection fraction, by estimation, is 55 to 60%. The left ventricle has normal function. Left ventricular endocardial border not optimally defined to evaluate regional wall motion. Left ventricular diastolic parameters are consistent with Grade I diastolic dysfunction (impaired relaxation).  2. Right ventricular systolic function is normal. The right ventricular size is normal. Tricuspid regurgitation signal is inadequate for assessing PA pressure.  3. The mitral valve is normal in structure. No evidence of mitral valve regurgitation. No evidence of mitral stenosis.  4. The aortic valve is normal in structure. Aortic valve regurgitation is not visualized. No aortic stenosis is present. Comparison(s): No significant change from prior study. No obvious cardiac source of embolic stroke identified, although images are suboptimal. TEE in 2022 showed normal findings. FINDINGS  Left Ventricle: Left ventricular ejection fraction, by estimation, is 55 to 60%. The left ventricle has normal function. Left ventricular endocardial border not optimally defined to evaluate regional wall motion. The left ventricular internal cavity size was normal in size. There is no left ventricular hypertrophy. Left ventricular diastolic parameters are consistent with Grade I diastolic dysfunction (impaired relaxation). Indeterminate filling pressures. Right Ventricle: The right ventricular size is normal. No increase in right ventricular wall thickness. Right ventricular systolic function is normal. Tricuspid regurgitation signal is inadequate for assessing PA pressure. Left Atrium: Left atrial size was normal in size. Right At[facility]: Right atrial size was normal in size. Pericardium: There is no evidence of pericardial effusion. Mitral  Valve: The mitral valve is normal in structure. No evidence of mitral valve regurgitation. No evidence of mitral valve stenosis. Tricuspid Valve: The tricuspid valve is not well visualized. Tricuspid valve regurgitation is not demonstrated. Aortic Valve: The aortic valve is normal in structure. Aortic valve regurgitation is not visualized. No aortic stenosis is present. Pulmonic Valve: The pulmonic valve was not well visualized. Aorta: The aortic root is normal in size and structure. IAS/Shunts: The interatrial septum was not well visualized.  LEFT VENTRICLE PLAX 2D LVIDd:         3.70 cm   Diastology LV PW:         1.00 cm   LV e' medial:    6.20 cm/s LV IVS:        1.10 cm  LV E/e' medial:  12.0 LVOT diam:     1.80 cm   LV e' lateral:   7.07 cm/s LV SV:         39        LV E/e' lateral: 10.5 LV SV Index:   25 LVOT Area:     2.54 cm  RIGHT VENTRICLE RV S prime:     9.03 cm/s TAPSE (M-mode): 1.5 cm LEFT ATRIUM             Index       RIGHT ATRIUM          Index LA Vol (A2C):   12.4 ml 7.96 ml/m  RA Area:     7.61 cm LA Vol (A4C):   14.2 ml 9.12 ml/m  RA Volume:   15.00 ml 9.63 ml/m LA Biplane Vol: 13.4 ml 8.60 ml/m  AORTIC VALVE LVOT Vmax:   78.10 cm/s LVOT Vmean:  50.300 cm/s LVOT VTI:    0.153 m  AORTA Ao Root diam: 3.00 cm MITRAL VALVE MV Area (PHT): 3.65 cm    SHUNTS MV Decel Time: 208 msec    Systemic VTI:  0.15 m MV E velocity: 74.40 cm/s  Systemic Diam: 1.80 cm MV A velocity: 81.20 cm/s MV E/A ratio:  0.92 Mihai Croitoru MD Electronically signed by Jerel Balding MD Signature Date/Time: 07/02/2023/8:56:40 AM    Final     Vitals:   07/01/23 2309 07/02/23 0319 07/02/23 0741 07/02/23 0830  BP: (!) 144/84 (!) 136/90 128/85 (!) 141/90  Pulse: 93 81 74 90  Resp: 18 18 18    Temp: 98.5 F (36.9 C) 98.7 F (37.1 C) 97.7 F (36.5 C)   TempSrc: Oral Oral Oral   SpO2: 98% 96% 98%   Weight:      Height:         PHYSICAL EXAM General:  Alert, well-nourished, well-developed patient in no acute  distress Psych:  Mood and affect appropriate for situation CV: Regular rate and rhythm on monitor Respiratory:  Regular, unlabored respirations on room air  NEURO:  Mental Status: Alert and oriented to person and place, disoriented to time and gives different history of situation than she gave yesterday Speech/Language: speech is without dysarthria or aphasia.    Cranial Nerves:  II: PERRL. Visual fields full.  III, IV, VI: EOMI. Eyelids elevate symmetrically.  V: Sensation is intact to light touch and symmetrical to face.  VII: Face is symmetrical resting and smiling VIII: hearing intact to voice. IX, X: Phonation is normal.  XII: tongue is midline without fasciculations. Motor: Able to move all 4 extremities with good antigravity strength, right hand grip weaker than left, diminished fine finger movements bilaterally Tone: is normal and bulk is normal Sensation- Intact to light touch bilaterally.  Coordination: FTN intact bilaterally Gait- deferred   ASSESSMENT/PLAN  Jacqueline Bonilla is a 68 y.o. female with history of 2 left MCA territory strokes currently participating in the [place] trial, loop recorder placement, vascular dementia, sleep apnea, hyperlipidemia, hypertension, thrombocytopenia, osteoporosis, depression, anxiety and anemia admitted for acute onset confusion, gait instability, right sided weakness and right facial droop.  MRI was negative for acute stroke, and EEG was negative for seizure activity.  As MRI was negative, will continue Librexia study medication.  Patient does have a history of memory problems at home, and workup for this was completed in February.  NIH on Admission 5  TIA: Likely left-sided TIA versus complex partial seizure Code Stroke CT  head No acute abnormality. Small vessel disease. Atrophy. ASPECTS 10.    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 Frequent spikes were also noted in left frontotemporal region.  LDL 99 HgbA1c 5.5 VTE prophylaxis -SCDs aspirin  81 mg daily and Librexia study medication prior to admission, now on aspirin  81 mg daily and Librexia stroke study medication  Therapy recommendations:  SNF Disposition: Pending  Hx of Stroke/TIA Patient has a history of left MCA stroke status post mechanical thrombectomy in 2022 Patient has history of left MCA stroke status post TNK in 3/25  Hypertension Home meds: Losartan  25 mg daily Stable Contain normotension  Hyperlipidemia Home meds: Atorvastatin  40 mg daily, compliance questionable LDL 99, goal < 70 Continue statin at discharge  Other Stroke Risk Factors Advanced age   Other Active Problems None  Hospital day # 0    Patient presented with transient confusion and worsening right-sided weakness which appears to have resolved and MRI is negative for acute stroke.  She had somewhat similar episode in the past in 2022 with an abnormal EEG  showed continuous 3 to 6 Hz theta-delta slowing in left frontotemporal region. Frequent spikes were also noted in left frontotemporal region. was felt to be a seizure but was put on Keppra  only for short period of time.  Overnight EEG monitoring this admission does not show any seizure activity but given recurrent similar episodes with negative brain imaging there is a strong consideration for complex partial seizure hence we will put abnormal trial of  Keppra  long-term.  Continue aspirin  and librexia stroke trial medication for now.   Aggressive risk factor modification.  Long discussion with patient and her husband over the phone and answered questions.  Discussed with Dr. Jonel.  Follow-up as an  outpatient in stroke clinic as per Librexia stroke study protocol Greater than 50% time during this 35-minute visit was spent in counseling and coordination of care and discussion with patient and care team and answering questions about her presentation and discussion about TIA versus complex partial seizure. Eather Popp, MD Medical Director [facility] Pager: [phone] 07/02/2023 11:48 AM   To contact Stroke Continuity provider, please refer to [email or web address]. After hours, contact General Neurology

## 2023-07-02 NOTE — TOC Transition Note (Signed)
 Transition of Care [facility]) - Discharge Note   Patient Details  Name: Jacqueline Bonilla MRN: [id number] Date of Birth: 1955/05/20  [facility] CM/SW Contact:  Almarie [name], LCSW Phone Number: 07/02/2023, 2:52 PM   Clinical Narrative:   CSW updated by MD that patient stable to discharge to SNF today, paperwork sent to [facility] and confirmed receipt. CSW updated patient's spouse, Carlin, via phone and he is in agreement. Transport arranged with [facility] for next available.  Nurse to call report to [phone], Room 609a.    Final next level of care: Skilled Nursing Facility Barriers to Discharge: Barriers Resolved   Patient Goals and CMS Choice            Discharge Placement              Patient chooses bed at: [name] Patient to be transferred to facility by: [facility] Name of family member notified: Carlin Patient and family notified of of transfer: 07/02/23  Discharge Plan and Services Additional resources added to the After Visit Summary for                                       Social Drivers of Health (SDOH) Interventions SDOH Screenings   Food Insecurity: No Food Insecurity (07/02/2023)  Housing: Low Risk  (07/02/2023)  Transportation Needs: No Transportation Needs (07/02/2023)  Utilities: Not At Risk (07/02/2023)  Alcohol Screen: Low Risk  (10/23/2022)  Depression (PHQ2-9): Low Risk  (04/23/2023)  Recent Concern: Depression (PHQ2-9) - Medium Risk (04/17/2023)  Financial Resource Strain: Low Risk  (10/23/2022)  Physical Activity: Sufficiently Active (10/23/2022)  Social Connections: Moderately Isolated (07/02/2023)  Stress: No Stress Concern Present (08/28/2021)  Tobacco Use: Low Risk  (06/30/2023)  Health Literacy: Inadequate Health Literacy (10/23/2022)     Readmission Risk Interventions     No data to display

## 2023-07-02 NOTE — Plan of Care (Signed)

## 2023-07-02 NOTE — Progress Notes (Signed)
LTM EEG D/C'd. No skin break down noted. [facility] notified.

## 2023-07-02 NOTE — Discharge Summary (Addendum)
 Physician Discharge Summary   Patient: Jacqueline Bonilla MRN: [id number] DOB: 15-Apr-1955  Admit date:     06/30/2023  Discharge date: 07/02/23  Discharge Physician: Lonni SHAUNNA Dalton   PCP: Purcell Emil Schanz, MD     Recommendations at discharge:  Follow up with Neurology Dr. Rosemarie in 6-8 weeks Follow up with PCP Dr. Purcell one week after discharge from SNF     Discharge Diagnoses: Principal Problem:   Seizure-like activity Active Problems:   Possible TIA   Vascular dementia without behavioral disturbance (HCC)   Essential hypertension   Normocytic anemia   History of CVA (cerebrovascular accident)   Dyslipidemia   Hemiparesis affecting right side as late effect of cerebrovascular accident (CVA) [facility])     Hospital Course: 68 yo F with history stroke, HLD, vascular dementia, lives with husband, who presented with acute confusion, ataxia, facial droop and right sided weakness.   In the ER, MRI negative.   Seizure-like activity Presented with acute worsening confusion, focal weakness, facial droop.  This was very similar to an episode she had back several years ago, at which time she had an abnormal EEG and was on Keppra  for a time.  Here, CTH normal.   CTA showed Left severe M2, proximal M3 and Right P2 stenoses, but no MRI brain infarcts.   LDL 99.  A1c 5.5%.  LTM EEG was unremarkable, but Neurology suspected seizure over TIA and recommended initiating Depakote XR 500 mg daily and follow up.        Hypertension Stable on losartan    Vascular dementia Lives with husband. PT recommended SNF.     Hyperlipidemia LDL 99 on Lipitor           Consultants: Neurology Procedures performed: LTM EEG  Disposition: Skilled nursing facility Diet recommendation:  Cardiac diet  DISCHARGE MEDICATION: Allergies as of 07/02/2023       Reactions   Iohexol  Other (See Comments)    Code: HIVES, Desc: HIVES S/P IVP MANY YRS AGO   Plavix  [clopidogrel ] Diarrhea    Quinolones Hives   Cipro, Levaquin, Avelox all cause hives   Shellfish Allergy Itching   Itching under the chin. Described by patient but not seen by husband.        Medication List     TAKE these medications    Amino Acids Complex Tabs Take 1 tablet by mouth daily.   aspirin  81 MG chewable tablet Chew 1 tablet (81 mg total) by mouth daily.   atorvastatin  40 MG tablet Commonly known as: LIPITOR Take 1 tablet (40 mg total) by mouth daily.   CINNAMON PO Take 1 capsule by mouth daily.   CO Q 10 PO Take 1 capsule by mouth daily.   divalproex 500 MG 24 hr tablet Commonly known as: Depakote ER Take 1 tablet (500 mg total) by mouth daily.   LIBREXIA-STROKE milvexian or placebo 25 mg tablet Take 1 tablet by mouth 2 (two) times daily.   losartan  25 MG tablet Commonly known as: COZAAR  Take 0.5 tablets (12.5 mg total) by mouth daily.   MAGNESIUM PO Take 1 tablet by mouth at bedtime.   multivitamin with minerals Tabs tablet Take 1 tablet by mouth daily.   ZINC PO Take 1 tablet by mouth daily.        Contact information for follow-up providers     Rosemarie Eather RAMAN, MD Follow up.   Specialties: Neurology, Radiology Contact information: [address] [name] [address] [phone]  Purcell Emil Schanz, MD Follow up.   Specialty: Internal Medicine Contact information: [address] [name] [address] [phone]              Contact information for after-discharge care     Destination     [name] .   Service: Skilled Nursing Contact information: [address] [place]  [address] [phone]                       Discharge Exam: Filed Weights   06/30/23 1544  Weight: 53.1 kg  BP (!) 141/90 (BP Location: Right Arm)   Pulse 90   Temp 97.7 F (36.5 C) (Oral)   Resp 18   Ht 5' 4 (1.626 m)   Wt 53.1 kg   LMP 01/02/2003   SpO2 98%   BMI 20.08 kg/m    General: Pt is  alert, awake, not in acute distress, pleasant Cardiovascular: RRR, nl S1-S2, no murmurs appreciated.   No LE edema.   Respiratory: Normal respiratory rate and rhythm.  CTAB without rales or wheezes. Abdominal: Abdomen soft and non-tender.  No distension or HSM.   Neuro/Psych: Chronic right sided weakness, slight dysarthria, slight right arm contracture.  Short term memory seems impaired but at baseline.   Condition at discharge: fair  The results of significant diagnostics from this hospitalization (including imaging, microbiology, ancillary and laboratory) are listed below for reference.   Imaging Studies: Overnight EEG with video Result Date: 07/02/2023 Jacqueline Arlin KIDD, MD     07/02/2023 10:04 AM Patient Name: Jacqueline Bonilla MRN: [id number] Epilepsy Attending: Arlin Bonilla Jacqueline Referring Physician/Provider: Rosemarie Eather RAMAN, MD Duration: 07/01/2023 1344 to 07/02/2023 1000  Patient history:  68 y.o. female who presents with slurred speech and worsening right-sided weakness. EEG to evaluate for seizure.  Level of alertness: Awake, asleep  AEDs during EEG study: None  Technical aspects: This EEG study was done with scalp electrodes positioned according to the 10-20 International system of electrode placement. Electrical activity was reviewed with band pass filter of 1-70Hz , sensitivity of 7 uV/mm, display speed of 7mm/sec with a 60Hz  notched filter applied as appropriate. EEG data were recorded continuously and digitally stored.  Video monitoring was available and reviewed as appropriate.  Description: The posterior dominant rhythm consists of 8 Hz activity of moderate voltage (25-35 uV) seen predominantly in posterior head regions, symmetric and reactive to eye opening and eye closing.  Sleep was characterized by vertex waves, sleep spindles (12-14hz ), maximal fronto-central region.  IMPRESSION: This study is within normal limits. No seizures or epileptiform discharges were seen throughout the recording.   A normal interictal EEG does not exclude the diagnosis of epilepsy.  Arlin Bonilla Jacqueline    ECHOCARDIOGRAM COMPLETE Result Date: 07/02/2023    ECHOCARDIOGRAM REPORT   Patient Name:   Jacqueline Bonilla Date of Exam: 07/02/2023 Medical Rec #:  [id number]          Height:       64.0 in Accession #:    [id number]         Weight:       117.0 lb Date of Birth:  1955/04/10          BSA:          1.558 m Patient Age:    68 years           BP:           128/85 mmHg Patient Gender: F  HR:           59 bpm. Exam Location:  Inpatient Procedure: 2D Echo, Color Doppler and Cardiac Doppler (Both Spectral and Color            Flow Doppler were utilized during procedure). Indications:    Stroke  History:        Patient has prior history of Echocardiogram examinations, most                 recent 03/27/2023. Risk Factors:Hypertension and Dyslipidemia.  Sonographer:    Philomena Daring Referring Phys: [id number] Hadli Vandemark P Mayce Noyes  Sonographer Comments: Technically difficult study due to poor echo windows and no subcostal window. Image acquisition challenging due to patient body habitus. IMPRESSIONS  1. Left ventricular ejection fraction, by estimation, is 55 to 60%. The left ventricle has normal function. Left ventricular endocardial border not optimally defined to evaluate regional wall motion. Left ventricular diastolic parameters are consistent with Grade I diastolic dysfunction (impaired relaxation).  2. Right ventricular systolic function is normal. The right ventricular size is normal. Tricuspid regurgitation signal is inadequate for assessing PA pressure.  3. The mitral valve is normal in structure. No evidence of mitral valve regurgitation. No evidence of mitral stenosis.  4. The aortic valve is normal in structure. Aortic valve regurgitation is not visualized. No aortic stenosis is present. Comparison(s): No significant change from prior study. No obvious cardiac source of embolic stroke identified, although images  are suboptimal. TEE in 2022 showed normal findings. FINDINGS  Left Ventricle: Left ventricular ejection fraction, by estimation, is 55 to 60%. The left ventricle has normal function. Left ventricular endocardial border not optimally defined to evaluate regional wall motion. The left ventricular internal cavity size was normal in size. There is no left ventricular hypertrophy. Left ventricular diastolic parameters are consistent with Grade I diastolic dysfunction (impaired relaxation). Indeterminate filling pressures. Right Ventricle: The right ventricular size is normal. No increase in right ventricular wall thickness. Right ventricular systolic function is normal. Tricuspid regurgitation signal is inadequate for assessing PA pressure. Left Atrium: Left atrial size was normal in size. Right Atrium: Right atrial size was normal in size. Pericardium: There is no evidence of pericardial effusion. Mitral Valve: The mitral valve is normal in structure. No evidence of mitral valve regurgitation. No evidence of mitral valve stenosis. Tricuspid Valve: The tricuspid valve is not well visualized. Tricuspid valve regurgitation is not demonstrated. Aortic Valve: The aortic valve is normal in structure. Aortic valve regurgitation is not visualized. No aortic stenosis is present. Pulmonic Valve: The pulmonic valve was not well visualized. Aorta: The aortic root is normal in size and structure. IAS/Shunts: The interatrial septum was not well visualized.  LEFT VENTRICLE PLAX 2D LVIDd:         3.70 cm   Diastology LV PW:         1.00 cm   LV e' medial:    6.20 cm/s LV IVS:        1.10 cm   LV E/e' medial:  12.0 LVOT diam:     1.80 cm   LV e' lateral:   7.07 cm/s LV SV:         39        LV E/e' lateral: 10.5 LV SV Index:   25 LVOT Area:     2.54 cm  RIGHT VENTRICLE RV S prime:     9.03 cm/s TAPSE (M-mode): 1.5 cm LEFT ATRIUM  Index       RIGHT ATRIUM          Index LA Vol (A2C):   12.4 ml 7.96 ml/m  RA Area:     7.61  cm LA Vol (A4C):   14.2 ml 9.12 ml/m  RA Volume:   15.00 ml 9.63 ml/m LA Biplane Vol: 13.4 ml 8.60 ml/m  AORTIC VALVE LVOT Vmax:   78.10 cm/s LVOT Vmean:  50.300 cm/s LVOT VTI:    0.153 m  AORTA Ao Root diam: 3.00 cm MITRAL VALVE MV Area (PHT): 3.65 cm    SHUNTS MV Decel Time: 208 msec    Systemic VTI:  0.15 m MV E velocity: 74.40 cm/s  Systemic Diam: 1.80 cm MV A velocity: 81.20 cm/s MV E/A ratio:  0.92 Mihai Croitoru MD Electronically signed by Jerel Balding MD Signature Date/Time: 07/02/2023/8:56:40 AM    Final    EEG adult Result Date: 07/01/2023 Jacqueline Arlin KIDD, MD     07/01/2023 10:40 AM Patient Name: JAYLANI MCGUINN MRN: [id number] Epilepsy Attending: Arlin Bonilla Jacqueline Referring Physician/Provider: everitt Clint Abbey Earle FORBES, NP Date:  07/01/2023 Duration: 22.45 mins Patient history:  68 y.o. female who presents with slurred speech and worsening right-sided weakness. EEG to evaluate for seizure. Level of alertness: Awake AEDs during EEG study: None Technical aspects: This EEG study was done with scalp electrodes positioned according to the 10-20 International system of electrode placement. Electrical activity was reviewed with band pass filter of 1-70Hz , sensitivity of 7 uV/mm, display speed of 39mm/sec with a 60Hz  notched filter applied as appropriate. EEG data were recorded continuously and digitally stored.  Video monitoring was available and reviewed as appropriate. Description: The posterior dominant rhythm consists of 8 Hz activity of moderate voltage (25-35 uV) seen predominantly in posterior head regions, symmetric and reactive to eye opening and eye closing. Physiologic photic driving was not seen during photic stimulation.  Hyperventilation was not performed.   IMPRESSION: This study is within normal limits. No seizures or epileptiform discharges were seen throughout the recording. A normal interictal EEG does not exclude the diagnosis of epilepsy. Arlin Bonilla Jacqueline   MR BRAIN WO  CONTRAST Result Date: 06/30/2023 CLINICAL DATA:  Stroke, follow up EXAM: MRI HEAD WITHOUT CONTRAST TECHNIQUE: Multiplanar, multiecho pulse sequences of the brain and surrounding structures were obtained without intravenous contrast. COMPARISON:  CT head from earlier today. FINDINGS: Brain: Many small remote cerebellar infarcts bilaterally. Small remote left basal ganglia and left thalamus lacunar infarcts. Small remote bilateral occipital infarcts. Small remote left posterior insular infarct in bilateral frontal lobes. Patchy T2/FLAIR hyperintensities in the white matter, compatible with chronic microvascular ischemic disease. No evidence of acute infarct, acute hemorrhage, mass lesion or midline shift. No hydrocephalus. Cerebral atrophy. Vascular: Major arterial flow voids are maintained. Skull and upper cervical spine: Normal marrow signal. Sinuses/Orbits: Negative. IMPRESSION: 1. No evidence of acute intracranial abnormality. 2. Many small remote infarcts and chronic microvascular ischemic disease. Electronically Signed   By: Gilmore GORMAN Molt M.D.   On: 06/30/2023 21:03   CT ANGIO HEAD NECK W WO CM (CODE STROKE) Result Date: 06/30/2023 CLINICAL DATA:  Provided history: Stroke, follow-up. Left facial droop. Right-sided weakness. EXAM: CT ANGIOGRAPHY HEAD AND NECK WITH AND WITHOUT CONTRAST TECHNIQUE: Multidetector CT imaging of the head and neck was performed using the standard protocol during bolus administration of intravenous contrast. Multiplanar CT image reconstructions and MIPs were obtained to evaluate the vascular anatomy. Carotid stenosis measurements (when applicable) are obtained utilizing NASCET  criteria, using the distal internal carotid diameter as the denominator. RADIATION DOSE REDUCTION: This exam was performed according to the departmental dose-optimization program which includes automated exposure control, adjustment of the mA and/or kV according to patient size and/or use of iterative  reconstruction technique. CONTRAST:  75mL OMNIPAQUE  IOHEXOL  350 MG/ML SOLN COMPARISON:  CT angiogram head/neck 03/07/2023. FINDINGS: CTA NECK FINDINGS Aortic arch: Standard aortic branching. Atherosclerotic plaque within the visible thoracic aorta and proximal major branch vessels of the neck. No hemodynamically significant innominate or proximal subclavian artery stenosis. Right carotid system: CCA and ICA patent within the neck without stenosis. Minimal atherosclerotic plaque about the carotid bifurcation. Left carotid system: CCA and ICA patent within the neck without stenosis or significant atherosclerotic disease Vertebral arteries: Codominant and patent within the neck without stenosis or significant atherosclerotic disease. Skeleton: Spondylosis at the cervical and visualized upper thoracic levels. No acute fracture or aggressive osseous lesion. Other neck: Subcentimeter thyroid  nodules not meeting consensus criteria for ultrasound follow-up based on size. No follow-up imaging recommended. Reference: J Am Coll Radiol. 2015 Feb;12(2): 143-50. Upper chest: No consolidation within the imaged lung apices. Review of the MIP images confirms the above findings CTA HEAD FINDINGS Anterior circulation: The intracranial internal carotid arteries are patent. Nonstenotic atherosclerotic plaque within both vessels. The M1 middle cerebral arteries are patent. No right M2 proximal branch occlusion or high-grade proximal stenosis. Known severe distal left M2/proximal M3 stenosis (series 12, image 40). The anterior cerebral arteries are patent. No intracranial aneurysm is identified. Posterior circulation: The intracranial vertebral arteries are patent. The basilar artery is patent. The posterior cerebral arteries are patent. Severe stenosis within the right PCA P2 segment, unchanged (series 14, image 22) (series 15, image 24). Venous sinuses: Within the limitations of contrast timing, no convincing thrombus. Anatomic  variants: As described. Review of the MIP images confirms the above findings No emergent large vessel occlusion identified. These results were communicated to Dr. Voncile at 4:53 pmon 6/29/2025by text page via the [facility] messaging system. IMPRESSION: CTA neck: The common carotid, internal carotid and vertebral arteries are patent within the neck without stenosis. Minimal atherosclerotic plaque about the right carotid bifurcation. CTA head: 1. No proximal intracranial large vessel occlusion identified. 2. Intracranial atherosclerotic disease, most notably as follows. Known severe distal M2/proximal M3 left middle cerebral artery stenosis. Known severe stenosis within the right PCA P2 segment. Electronically Signed   By: Rockey Childs D.O.   On: 06/30/2023 16:54   CT HEAD CODE STROKE WO CONTRAST Result Date: 06/30/2023 CLINICAL DATA:  Code stroke. Neuro deficit, acute, stroke suspected. Left-sided facial droop. Right-sided weakness. EXAM: CT HEAD WITHOUT CONTRAST TECHNIQUE: Contiguous axial images were obtained from the base of the skull through the vertex without intravenous contrast. RADIATION DOSE REDUCTION: This exam was performed according to the departmental dose-optimization program which includes automated exposure control, adjustment of the mA and/or kV according to patient size and/or use of iterative reconstruction technique. COMPARISON:  Brain MRI 03/28/2023. FINDINGS: Brain: Cerebral atrophy. Known chronic cortical/subcortical infarcts within the bilateral frontal lobes/left insula, bilateral parietal lobes and bilateral occipital lobes. Background advanced chronic small vessel ischemic changes within the cerebral white matter. Multiple small chronic infarcts within the bilateral cerebellar hemispheres, not appreciably changed. There is no acute intracranial hemorrhage. No acute demarcated cortical infarct. No extra-axial fluid collection. No evidence of an intracranial mass. No midline shift. Vascular: No  hyperdense vessel.  Atherosclerotic calcifications. Skull: No calvarial fracture or aggressive osseous lesion. Sinuses/Orbits: No mass or acute  finding within the imaged orbits. No significant paranasal sinus disease at the imaged levels. ASPECTS [facility] Stroke Program Early CT Score) - Ganglionic level infarction (caudate, lentiform nuclei, internal capsule, insula, M1-M3 cortex): 7 - Supraganglionic infarction (M4-M6 cortex): 3 Total score (0-10 with 10 being normal): 10 (when discounting chronic infarcts). No evidence of an acute intracranial abnormality. These results were communicated to Dr. Voncile at 4:08 pmon 6/29/2025by text page via the [facility] messaging system. IMPRESSION: 1.  No evidence of an acute intracranial abnormality. 2. Parenchymal atrophy, chronic small vessel ischemic disease and chronic infarcts, as described. Electronically Signed   By: Rockey Childs D.O.   On: 06/30/2023 16:08   CUP PACEART REMOTE DEVICE CHECK Result Date: 06/20/2023 ILR summary report received. Battery status OK. Normal device function. No new symptom, tachy, brady, or pause episodes. No new AF episodes. Monthly summary reports and ROV/PRN ML, CVRS   Microbiology: Results for orders placed or performed during the hospital encounter of 03/27/23  MRSA Next Gen by PCR, Nasal     Status: None   Collection Time: 03/27/23  9:20 PM   Specimen: Nasal Mucosa; Nasal Swab  Result Value Ref Range Status   MRSA by PCR Next Gen NOT DETECTED NOT DETECTED Final    Comment: (NOTE) The GeneXpert MRSA Assay (FDA approved for NASAL specimens only), is one component of a comprehensive MRSA colonization surveillance program. It is not intended to diagnose MRSA infection nor to guide or monitor treatment for MRSA infections. Test performance is not FDA approved in patients less than 11 years old. Performed at [facility], [address], [place], [name] [address]     Labs: CBC: Recent Labs  Lab 06/30/23 1549  06/30/23 1553 07/01/23 0532 07/02/23 0441  WBC 6.3  --  6.4 5.6  NEUTROABS 3.9  --   --   --   HGB 12.5 13.3 11.5* 12.8  HCT 38.6 39.0 36.2 38.7  MCV 90.8  --  91.6 88.4  PLT 232  --  183 195   Basic Metabolic Panel: Recent Labs  Lab 06/30/23 1549 06/30/23 1553 07/01/23 0532 07/02/23 0441  NA 138 140  --  136  K 4.0 4.1  --  3.6  CL 106 106  --  101  CO2 21*  --   --  24  GLUCOSE 135* 138*  --  101*  BUN 22 24*  --  14  CREATININE 0.92 0.90 0.70 0.67  CALCIUM  9.5  --   --  9.3   Liver Function Tests: Recent Labs  Lab 06/30/23 1549  AST 23  ALT 16  ALKPHOS 45  BILITOT 0.4  PROT 7.6  ALBUMIN 3.8   CBG: No results for input(s): GLUCAP in the last 168 hours.  Discharge time spent: approximately 45 minutes spent on discharge counseling, evaluation of patient on day of discharge, and coordination of discharge planning with nursing, social work, pharmacy and case management  Signed: Lonni SHAUNNA Dalton, MD [facility] 07/02/2023

## 2023-07-02 NOTE — Evaluation (Signed)
 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   Frequent falls 02/11/2023   Crushing injury of right hip 02/11/2023   Statin intolerance 04/24/2022   Internal hemorrhoids 01/10/2021   Dyslipidemia 12/29/2020   History of CVA (cerebrovascular accident) 07/14/2020   Normocytic anemia 07/11/2020   Vascular dementia without behavioral disturbance (HCC) 07/15/2019   History of COVID-19 04/22/2019   Essential hypertension 03/03/2019   Past Medical History:  Past Medical History:  Diagnosis Date   Anemia    Anxiety    Depression    Dysmenorrhea    Endometriosis    Fibroid    Hypertension    Implantable loop recorder present 2021   Microhematuria    negative workup   Osteoporosis    Tachycardia    Thrombocytopenia (HCC)    Past Surgical History:  Past Surgical History:  Procedure Laterality Date   BREAST BIOPSY     BUBBLE STUDY  12/08/2020   Procedure: BUBBLE STUDY;  Surgeon: Delford Maude BROCKS, MD;  Location: [facility] ENDOSCOPY;  Service: Cardiovascular;;   CESAREAN SECTION     hysteroscopic resection     implantable loop recorder implant  10/21/2019   Medtronic Reveal Linq model LNQ 22 ([place] [id number]) implantable loop recorder   IR CT HEAD LTD  12/01/2020   IR PERCUTANEOUS ART THROMBECTOMY/INFUSION INTRACRANIAL INC DIAG ANGIO  12/01/2020   IR US  GUIDE VASC ACCESS RIGHT  12/01/2020   ORIF HUMERUS FRACTURE Left 07/31/2021   Procedure:  OPEN REDUCTION INTERNAL FIXATION (ORIF) DISTAL HUMERUS FRACTURE;  Surgeon: Kendal Franky SHAUNNA, MD;  Location: [facility] OR;  Service: Orthopedics;  Laterality: Left;   RADIOLOGY WITH ANESTHESIA N/A 12/01/2020   Procedure: IR WITH ANESTHESIA - CODE STROKE;  Surgeon: Radiologist, Medication, MD;  Location: [facility] OR;  Service: Radiology;  Laterality: N/A;   TEE WITHOUT CARDIOVERSION N/A 12/08/2020   Procedure: TRANSESOPHAGEAL ECHOCARDIOGRAM (TEE);  Surgeon: Delford Maude BROCKS, MD;  Location: [facility] ENDOSCOPY;  Service: Cardiovascular;  Laterality: N/A;   HPI:  Jacqueline Bonilla is a 68 yo female presenting to ED 6/29 with slurred speech and worsening R sided weakness. Symptoms largely resolved although she still has some residual weakness. MRI revealed small remote infarcts. Pt seen multiple times by SLPs in multiple settings throughout 2022, 2023, and 2025 with deficits including awareness, sustained attention, memory, and problem solving. PMH includes CVA, HLD, normocytic anemia, vascular dementia, essential HTN, statin intolerance, gait disturbance, thrombocytopenia   Assessment / Plan / Recommendation Clinical Impression  Pt reports baseline deficits including processing, memory, and attention. This seems consistent with documentation from previous admissions during which SLP was targeting similar goals. She states that she lives at home with her husband, who assists with all ADLs. Pt scored WFL on all subtests of the Cognistat except for those pertaining to delayed recall, calculations, and abstract reasoning. Expressive language impairments were not observed today. Pt's performance appears to be near her baseline so acute SLP f/u is not warranted, although pt may benefit from f/u at her next venue  of care to maintain CLOF. Will sign off at this time.    SLP Assessment  SLP Recommendation/Assessment: All further Speech Language Pathology needs can be addressed in the next venue of care SLP Visit Diagnosis: Cognitive  communication deficit (R41.841)     Assistance Recommended at Discharge  Frequent or constant Supervision/Assistance  Functional Status Assessment Patient has not had a recent decline in their functional status  Frequency and Duration           SLP Evaluation Cognition  Overall Cognitive Status: History of cognitive impairments - at baseline Arousal/Alertness: Awake/alert Orientation Level: Oriented X4 Attention: Sustained Sustained Attention: Appears intact Memory: Impaired Memory Impairment: Retrieval deficit Awareness: Appears intact Problem Solving: Impaired Problem Solving Impairment: Verbal basic Executive Function: Reasoning Reasoning: Impaired Reasoning Impairment: Verbal complex       Comprehension  Auditory Comprehension Overall Auditory Comprehension: Appears within functional limits for tasks assessed    Expression Expression Primary Mode of Expression: Verbal Verbal Expression Overall Verbal Expression: Appears within functional limits for tasks assessed Written Expression Dominant Hand: Right   Oral / Motor  Oral Motor/Sensory Function Overall Oral Motor/Sensory Function: Within functional limits Motor Speech Overall Motor Speech: Impaired at baseline Respiration: Within functional limits Phonation: Normal Resonance: Within functional limits Articulation: Within functional limitis Intelligibility: Intelligible Motor Planning: Impaired Level of Impairment: [name], M.A., CCC-SLP Speech Language Pathology, Acute Rehabilitation Services  Secure Chat preferred [phone]  07/02/2023, 1:10 PM

## 2023-07-03 ENCOUNTER — Ambulatory Visit: Payer: Medicare HMO | Admitting: Neurology

## 2023-07-03 ENCOUNTER — Encounter (HOSPITAL_COMMUNITY)

## 2023-07-03 DIAGNOSIS — I10 Essential (primary) hypertension: Secondary | ICD-10-CM | POA: Diagnosis not present

## 2023-07-03 DIAGNOSIS — E785 Hyperlipidemia, unspecified: Secondary | ICD-10-CM | POA: Diagnosis not present

## 2023-07-03 DIAGNOSIS — G459 Transient cerebral ischemic attack, unspecified: Secondary | ICD-10-CM | POA: Diagnosis not present

## 2023-07-03 NOTE — Progress Notes (Signed)
 [facility] Summary Report / Loop Recorder

## 2023-07-04 DIAGNOSIS — G459 Transient cerebral ischemic attack, unspecified: Secondary | ICD-10-CM | POA: Diagnosis not present

## 2023-07-04 DIAGNOSIS — D649 Anemia, unspecified: Secondary | ICD-10-CM | POA: Diagnosis not present

## 2023-07-04 DIAGNOSIS — G4089 Other seizures: Secondary | ICD-10-CM | POA: Diagnosis not present

## 2023-07-04 DIAGNOSIS — E785 Hyperlipidemia, unspecified: Secondary | ICD-10-CM | POA: Diagnosis not present

## 2023-07-08 DIAGNOSIS — M545 Low back pain, unspecified: Secondary | ICD-10-CM | POA: Diagnosis not present

## 2023-07-08 DIAGNOSIS — M532X6 Spinal instabilities, lumbar region: Secondary | ICD-10-CM | POA: Diagnosis not present

## 2023-07-08 DIAGNOSIS — I10 Essential (primary) hypertension: Secondary | ICD-10-CM | POA: Diagnosis not present

## 2023-07-09 ENCOUNTER — Ambulatory Visit: Admitting: Physical Therapy

## 2023-07-09 ENCOUNTER — Telehealth: Payer: Self-pay

## 2023-07-09 NOTE — Patient Outreach (Signed)
 Telephone outreach to patient's husband to obtain mRS was successfully completed. MRS= 3  Shereen Gin [facility] Health Care Management Assistant  Direct Dial: [phone]  Fax: [phone] Website: [email or web address]

## 2023-07-11 ENCOUNTER — Ambulatory Visit: Admitting: Physical Therapy

## 2023-07-12 ENCOUNTER — Telehealth: Payer: Self-pay | Admitting: Emergency Medicine

## 2023-07-12 NOTE — Telephone Encounter (Signed)
 Copied from CRM [id number]. Topic: General - Other >> Jul 11, 2023  5:07 PM Jacqueline Bonilla wrote: Reason for CRM: Patient is being discharged from the hospital on Sunday or Monday, Patients husband is asking if her PCP or the hospital will arrange her Home Health care. Please advise the patients husband who will arrange this for them.

## 2023-07-12 NOTE — Telephone Encounter (Signed)
 Spoke with husband and schedule HFU / referral appt

## 2023-07-15 ENCOUNTER — Telehealth: Payer: Self-pay

## 2023-07-15 ENCOUNTER — Telehealth: Payer: Self-pay | Admitting: Emergency Medicine

## 2023-07-15 ENCOUNTER — Telehealth: Payer: Self-pay | Admitting: Pharmacist

## 2023-07-15 NOTE — Telephone Encounter (Signed)
 Order as requested please.

## 2023-07-15 NOTE — Telephone Encounter (Unsigned)
 Copied from CRM [id number]. Topic: Clinical - Home Health Verbal Orders >> Jul 15, 2023  9:04 AM Rosina BIRCH wrote: Caller/Agency: [facility] Callback Number: [phone] Service Requested: Physical Therapy and nurse for medication management Frequency: one nurse for evaluation and physical therapy for evaluation Any new concerns about the patient? No

## 2023-07-15 NOTE — Transitions of Care (Post Inpatient/ED Visit) (Signed)
 07/15/2023  Name: Jacqueline Bonilla MRN: [id number] DOB: 04/11/1955  Today's TOC FU Call Status: Today's TOC FU Call Status:: Successful TOC FU Call Completed TOC FU Call Complete Date: 07/15/23 Patient's Name and Date of Birth confirmed.  Transition Care Management Follow-up Telephone Call Date of Discharge: 07/14/23 Discharge Facility: Other [facility]) Name of Other (Non[facility]) Discharge Facility: [name] Type of Discharge: Inpatient Admission Primary Inpatient Discharge Diagnosis:: weakness How have you been since you were released from the hospital?: Better Any questions or concerns?: No  Items Reviewed: Did you receive and understand the discharge instructions provided?: Yes Medications obtained,verified, and reconciled?: Yes (Medications Reviewed) Dietary orders reviewed?: Yes Do you have support at home?: Yes People in Home [RPT]: spouse  Medications Reviewed Today: Medications Reviewed Today     Reviewed by Emmitt Pan, LPN (Licensed Practical Nurse) on 07/15/23 at 1141  Med List Status: <None>   Medication Order Taking? Sig Documenting Provider Last Dose Status Informant  Amino Acids  Complex TABS [id number] Yes Take 1 tablet by mouth daily. [provider]  Active Spouse/Significant Other, Pharmacy Records  aspirin  81 MG chewable tablet [id number] Yes Chew 1 tablet (81 mg total) by mouth daily. Remi Pippin, NP  Active Spouse/Significant Other, Pharmacy Records  atorvastatin  (LIPITOR) 40 MG tablet [id number] Yes Take 1 tablet (40 mg total) by mouth daily. Purcell Emil Schanz, MD  Active Spouse/Significant Other, Pharmacy Records           Med Note Winifred, ROCKY ONEIDA Kitchens Jul 01, 2023 10:42 AM) Pt's husband states that the pt is compliant with this medication; however, per [facility] the RX sent in on 05/29/23 was never sold, per dispense report LF 04/10/23 #30, 30 DS   CINNAMON  PO [id number] Yes Take 1 capsule by mouth daily. [provider]  Active Spouse/Significant Other, Pharmacy Records  Coenzyme Q10 (CO Q 10 PO) [id number] Yes Take 1 capsule by mouth daily. [provider]  Active Spouse/Significant Other, Pharmacy Records  divalproex  (DEPAKOTE  ER) 500 MG 24 hr tablet [id number] Yes Take 1 tablet (500 mg total) by mouth daily. Jonel Lonni SHAUNNA, MD  Active   losartan  (COZAAR ) 25 MG tablet [id number] Yes Take 0.5 tablets (12.5 mg total) by mouth daily. Pegge Toribio PARAS, PA-C  Active Spouse/Significant Other, Pharmacy Records  MAGNESIUM  PO [id number] Yes Take 1 tablet by mouth at bedtime. [provider]  Active Spouse/Significant Other, Pharmacy Records  Multiple Vitamin (MULTIVITAMIN WITH MINERALS) TABS tablet [id number] Yes Take 1 tablet by mouth daily. [provider]  Active Spouse/Significant Other, Pharmacy Records  Multiple Vitamins-Minerals (ZINC  PO) [id number] Yes Take 1 tablet by mouth daily. [provider]  Active Spouse/Significant Other, Pharmacy Records  Study - LIBREXIA-STROKE - milvexian 25 mg or placebo tablet (PI-Sethi) [id number] Yes Take 1 tablet by mouth 2 (two) times daily. Rosemarie Eather RAMAN, MD  Active Spouse/Significant Other, Pharmacy Records            Home Care and Equipment/Supplies: Were Home Health Services Ordered?: NA Any new equipment or medical supplies ordered?: NA  Functional Questionnaire: Do you need assistance with bathing/showering or dressing?: Yes Do you need assistance with meal preparation?: Yes Do you need assistance with eating?: No Do you have difficulty maintaining continence: No Do you need assistance with getting out of bed/getting out of a chair/moving?: Yes Do you have difficulty managing or taking your medications?: No  Follow up appointments reviewed: PCP Follow-up appointment confirmed?: Yes Date of PCP follow-up appointment?: 07/16/23  Follow-up Provider: [facility] Follow-up appointment  confirmed?: No Reason Specialist Follow-Up Not Confirmed: Patient has Specialist Provider Number and will Call for Appointment Do you need transportation to your follow-up appointment?: No Do you understand care options if your condition(s) worsen?: Yes-patient verbalized understanding    SIGNATURE Julian Lemmings, LPN [facility] Nurse Health Advisor Direct Dial [phone]

## 2023-07-15 NOTE — Progress Notes (Signed)
 Pharmacy Quality Measure Review  This patient is appearing on a report for being at risk of failing the adherence measure for cholesterol (statin) medications this calendar year.   Medication: Atorvastatin  Last fill date: 04/08/23 for 30 day supply  Refill was sent 05/29/23, appears it was filled but never picked up. Pt is now past adherence date. Only filled atorvastatin  once this year.  Darrelyn Drum, PharmD, BCPS, CPP Clinical Pharmacist Practitioner [facility] [phone]

## 2023-07-16 ENCOUNTER — Ambulatory Visit (INDEPENDENT_AMBULATORY_CARE_PROVIDER_SITE_OTHER): Admitting: Emergency Medicine

## 2023-07-16 ENCOUNTER — Encounter: Admitting: Occupational Therapy

## 2023-07-16 ENCOUNTER — Encounter: Payer: Self-pay | Admitting: Emergency Medicine

## 2023-07-16 ENCOUNTER — Ambulatory Visit: Admitting: Physical Therapy

## 2023-07-16 VITALS — BP 124/82 | HR 88 | Temp 98.8°F | Ht 64.0 in | Wt 118.0 lb

## 2023-07-16 DIAGNOSIS — R569 Unspecified convulsions: Secondary | ICD-10-CM | POA: Diagnosis not present

## 2023-07-16 DIAGNOSIS — I10 Essential (primary) hypertension: Secondary | ICD-10-CM | POA: Diagnosis not present

## 2023-07-16 DIAGNOSIS — Z8673 Personal history of transient ischemic attack (TIA), and cerebral infarction without residual deficits: Secondary | ICD-10-CM | POA: Diagnosis not present

## 2023-07-16 DIAGNOSIS — E785 Hyperlipidemia, unspecified: Secondary | ICD-10-CM | POA: Diagnosis not present

## 2023-07-16 DIAGNOSIS — F0150 Vascular dementia without behavioral disturbance: Secondary | ICD-10-CM

## 2023-07-16 DIAGNOSIS — R296 Repeated falls: Secondary | ICD-10-CM | POA: Diagnosis not present

## 2023-07-16 DIAGNOSIS — I69351 Hemiplegia and hemiparesis following cerebral infarction affecting right dominant side: Secondary | ICD-10-CM

## 2023-07-16 MED ORDER — DIVALPROEX SODIUM ER 500 MG PO TB24: 500.0000 mg | ORAL_TABLET | Freq: Every day | ORAL | 3 refills | Status: DC

## 2023-07-16 NOTE — Assessment & Plan Note (Signed)
 BP Readings from Last 3 Encounters:  07/16/23 124/82  07/02/23 (!) 134/91  06/20/23 127/81   Well-controlled hypertension Continues losartan  12.5 mg daily

## 2023-07-16 NOTE — Progress Notes (Signed)
 Jacqueline Bonilla 68 y.o.   Chief Complaint  Patient presents with   Hospitalization Follow-up    Patient here for HFU.     HISTORY OF PRESENT ILLNESS: This is a 68 y.o. female here for hospital discharge follow-up accompanied by husband [place]. Was discharged to skilled nursing facility.  Returned home 2 days ago. Stable but fall concerns still persist.  Physician Discharge Summary    Patient: Jacqueline Bonilla MRN: [id number] DOB: 05-05-1955  Admit date:     06/30/2023  Discharge date: 07/02/23  Discharge Physician: Lonni SHAUNNA Dalton    PCP: Purcell Emil Schanz, MD        Recommendations at discharge:  Follow up with Neurology Dr. Rosemarie in 6-8 weeks Follow up with PCP Dr. Purcell one week after discharge from SNF         Discharge Diagnoses: Principal Problem:   Seizure-like activity Active Problems:   Possible TIA   Vascular dementia without behavioral disturbance (HCC)   Essential hypertension   Normocytic anemia   History of CVA (cerebrovascular accident)   Dyslipidemia   Hemiparesis affecting right side as late effect of cerebrovascular accident (CVA) [facility])         Hospital Course: 68 yo F with history stroke, HLD, vascular dementia, lives with husband, who presented with acute confusion, ataxia, facial droop and right sided weakness.   In the ER, MRI negative.     Seizure-like activity Presented with acute worsening confusion, focal weakness, facial droop.  This was very similar to an episode she had back several years ago, at which time she had an abnormal EEG and was on Keppra  for a time.   Here, CTH normal.   CTA showed Left severe M2, proximal M3 and Right P2 stenoses, but no MRI brain infarcts.   LDL 99.  A1c 5.5%.  LTM EEG was unremarkable, but Neurology suspected seizure over TIA and recommended initiating Depakote  XR 500 mg daily and follow up.        Hypertension Stable on losartan    Vascular dementia Lives with husband. PT  recommended SNF.     Hyperlipidemia LDL 99 on Lipitor     HPI   Prior to Admission medications   Medication Sig Start Date End Date Taking? Authorizing Provider  Amino Acids  Complex TABS Take 1 tablet by mouth daily.   Yes [provider]  aspirin  81 MG chewable tablet Chew 1 tablet (81 mg total) by mouth daily. 04/02/23  Yes Shafer, Jorene, NP  atorvastatin  (LIPITOR) 40 MG tablet Take 1 tablet (40 mg total) by mouth daily. 05/29/23  Yes SagardiaEmil Schanz, MD  CINNAMON  PO Take 1 capsule by mouth daily.   Yes [provider]  Coenzyme Q10 (CO Q 10 PO) Take 1 capsule by mouth daily.   Yes [provider]  losartan  (COZAAR ) 25 MG tablet Take 0.5 tablets (12.5 mg total) by mouth daily. 04/08/23  Yes Angiulli, Toribio PARAS, PA-C  MAGNESIUM  PO Take 1 tablet by mouth at bedtime.   Yes [provider]  Multiple Vitamin (MULTIVITAMIN WITH MINERALS) TABS tablet Take 1 tablet by mouth daily.   Yes [provider]  Multiple Vitamins-Minerals (ZINC  PO) Take 1 tablet by mouth daily.   Yes [provider]  Study - LIBREXIA-STROKE - milvexian 25 mg or placebo tablet (PI-Sethi) Take 1 tablet by mouth 2 (two) times daily. 06/26/23  Yes Rosemarie Eather RAMAN, MD  divalproex  (DEPAKOTE  ER) 500 MG 24 hr tablet Take 1 tablet (500  mg total) by mouth daily. 07/16/23 07/15/24  Purcell Emil Schanz, MD    Allergies  Allergen Reactions   Iohexol  Other (See Comments)     Code: HIVES, Desc: HIVES S/P IVP MANY YRS AGO    Plavix  [Clopidogrel ] Diarrhea   Quinolones Hives    Cipro, Levaquin, Avelox all cause hives   Shellfish Allergy Itching    Itching under the chin. Described by patient but not seen by husband.    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   Frequent falls 02/11/2023   Crushing injury of right hip 02/11/2023   Statin intolerance 04/24/2022   Internal hemorrhoids 01/10/2021   Dyslipidemia 12/29/2020   History of CVA (cerebrovascular accident) 07/14/2020   Normocytic anemia 07/11/2020   Vascular dementia without behavioral disturbance (HCC) 07/15/2019   History of COVID-19 04/22/2019   Essential hypertension 03/03/2019    Past Medical History:  Diagnosis Date   Anemia    Anxiety    Depression    Dysmenorrhea    Endometriosis    Fibroid    Hypertension    Implantable loop recorder present 2021   Microhematuria    negative workup   Osteoporosis    Tachycardia    Thrombocytopenia (HCC)     Past Surgical History:  Procedure Laterality Date   BREAST BIOPSY     BUBBLE STUDY  12/08/2020   Procedure: BUBBLE STUDY;  Surgeon: Delford Maude BROCKS, MD;  Location: [facility] ENDOSCOPY;  Service: Cardiovascular;;   CESAREAN SECTION     hysteroscopic resection     implantable loop recorder implant  10/21/2019   Medtronic Reveal Linq model LNQ 22 ([place] [id number]) implantable loop recorder   IR CT HEAD LTD  12/01/2020   IR PERCUTANEOUS ART THROMBECTOMY/INFUSION INTRACRANIAL INC DIAG ANGIO  12/01/2020   IR US  GUIDE VASC ACCESS RIGHT  12/01/2020   ORIF HUMERUS FRACTURE Left 07/31/2021   Procedure: OPEN REDUCTION INTERNAL FIXATION (ORIF) DISTAL HUMERUS FRACTURE;  Surgeon: Kendal Franky SQUIBB, MD;  Location: [facility] OR;  Service: Orthopedics;  Laterality: Left;   RADIOLOGY WITH ANESTHESIA N/A 12/01/2020   Procedure: IR WITH ANESTHESIA - CODE STROKE;  Surgeon: Radiologist, Medication, MD;  Location: [facility] OR;  Service: Radiology;  Laterality: N/A;   TEE WITHOUT CARDIOVERSION N/A 12/08/2020   Procedure: TRANSESOPHAGEAL ECHOCARDIOGRAM (TEE);  Surgeon: Delford Maude BROCKS, MD;  Location: [facility] ENDOSCOPY;  Service: Cardiovascular;   Laterality: N/A;    Social History   Socioeconomic History   Marital status: Married    Spouse name: Actor   Number of children: 1   Years of education: Not on file   Highest education level: Not on file  Occupational History   Occupation: accounting   Occupation: Retired  Tobacco Use   Smoking status: Never   Smokeless tobacco: Never  Vaping Use   Vaping status: Never Used  Substance and Sexual Activity   Alcohol use: Never   Drug use: Never   Sexual activity: Not Currently    Partners: Male    Comment: husband vasectomy  Other Topics Concern   Not on file  Social History Narrative   Lives with husband   Grandchildren - 1   Works - Audiological scientist  Set belt 100%   Gun in home - yes - secured      Right handed   Caffeine: maybe tea every now and then   Social Drivers of Health   Financial Resource Strain: Low Risk  (10/23/2022)   Overall Financial Resource Strain (CARDIA)    Difficulty of Paying Living Expenses: Not hard at all  Food Insecurity: No Food Insecurity (07/02/2023)   Hunger Vital Sign    Worried About Running Out of Food in the Last Year: Never true    Ran Out of Food in the Last Year: Never true  Transportation Needs: No Transportation Needs (07/02/2023)   PRAPARE - Administrator, Civil Service (Medical): No    Lack of Transportation (Non-Medical): No  Physical Activity: Sufficiently Active (10/23/2022)   Exercise Vital Sign    Days of Exercise per Week: 6 days    Minutes of Exercise per Session: 40 min  Stress: No Stress Concern Present (08/28/2021)   [employer] of Occupational Health - Occupational Stress Questionnaire    Feeling of Stress : Not at all  Social Connections: Moderately Isolated (07/02/2023)   Social Connection and Isolation Panel    Frequency of Communication with Friends and Family: Once a week    Frequency of Social Gatherings with Friends and Family: Once a week    Attends Religious Services: Never    Automotive engineer or Organizations: Yes    Attends Banker Meetings: Never    Marital Status: Married  Catering manager Violence: Not At Risk (07/02/2023)   Humiliation, Afraid, Rape, and Kick questionnaire    Fear of Current or Ex-Partner: No    Emotionally Abused: No    Physically Abused: No    Sexually Abused: No    Family History  Problem Relation Age of Onset   Cancer Father        non hodgkin lymphoma & skin   Heart attack Maternal Grandfather    Dementia Mother    Polymyositis Sister      Review of Systems  Constitutional: Negative.  Negative for chills and fever.  HENT: Negative.  Negative for congestion and sore throat.   Respiratory: Negative.  Negative for cough and shortness of breath.   Cardiovascular: Negative.  Negative for chest pain and palpitations.  Gastrointestinal:  Negative for abdominal pain, diarrhea, nausea and vomiting.  Genitourinary: Negative.  Negative for dysuria and hematuria.  Skin: Negative.  Negative for rash.  Neurological: Negative.  Negative for dizziness and headaches.  All other systems reviewed and are negative.   Vitals:   07/16/23 1040  BP: 124/82  Pulse: 88  Temp: 98.8 F (37.1 C)  SpO2: 99%    Physical Exam Vitals reviewed.  Constitutional:      Appearance: Normal appearance.  HENT:     Head: Normocephalic.  Eyes:     Extraocular Movements: Extraocular movements intact.  Cardiovascular:     Rate and Rhythm: Normal rate and regular rhythm.     Pulses: Normal pulses.     Heart sounds: Normal heart sounds.  Pulmonary:     Effort: Pulmonary effort is normal.     Breath sounds: Normal breath sounds.  Musculoskeletal:     Cervical back: No tenderness.  Lymphadenopathy:     Cervical: No cervical adenopathy.  Skin:    General: Skin is warm and dry.  Neurological:     Mental Status: She is alert and oriented to person, place, and time. Mental  status is at baseline.  Psychiatric:        Behavior: Behavior  normal.      ASSESSMENT & PLAN: A total of 45 minutes was spent with the patient and counseling/coordination of care regarding preparing for this visit, review of most recent office visit notes, review of most recent hospital discharge summary, review of multiple chronic medical conditions and their management, review of all medications, review of most recent bloodwork results, review of health maintenance items, education on nutrition, prognosis, documentation, and need for follow up.  Problem List Items Addressed This Visit       Cardiovascular and Mediastinum   Essential hypertension - Primary   BP Readings from Last 3 Encounters:  07/16/23 124/82  07/02/23 (!) 134/91  06/20/23 127/81   Well-controlled hypertension Continues losartan  12.5 mg daily        Nervous and Auditory   Vascular dementia without behavioral disturbance (HCC)   Stable chronic condition. No concerns.       Relevant Medications   divalproex  (DEPAKOTE  ER) 500 MG 24 hr tablet   Hemiparesis affecting right side as late effect of cerebrovascular accident (CVA) (HCC)   Stable and making progress.        Other   History of CVA (cerebrovascular accident)   Secondary prevention measures discussed Not diabetic Well-controlled hypertension Presently on atorvastatin  40 mg daily Continues daily baby aspirin .  No longer on Brilinta  Scheduled for neurologist follow-up      Seizure-like activity (HCC)   Clinically stable.  Most likely secondary to recent TIA or previous stroke residual effect.  Recently started on Depakote  500 mg daily Will follow-up with neurologist      Dyslipidemia   Presently on atorvastatin  40 mg daily.      Frequent falls   Using walker and/or cane at all times Left foot deviates laterally when walking and at times trips on walker or cane on that particular side. Continues physical therapy Fall precautions given      Patient Instructions  Stroke Prevention Some medical  conditions and lifestyle choices can lead to a higher risk for a stroke. You can help to prevent a stroke by eating healthy foods and exercising. It also helps to not smoke and to manage any health problems you may have. How can this condition affect me? A stroke is an emergency. It should be treated right away. A stroke can lead to brain damage or threaten your life. There is a better chance of surviving and getting better after a stroke if you get medical help right away. What can increase my risk? The following medical conditions may increase your risk of a stroke: Diseases of the heart and blood vessels (cardiovascular disease). High blood pressure (hypertension). Diabetes. High cholesterol. Sickle cell disease. Problems with blood clotting. Being very overweight. Sleeping problems (obstructivesleep apnea). Other risk factors include: Being older than age 19. A history of blood clots, stroke, or mini-stroke (TIA). Race, ethnic background, or a family history of stroke. Smoking or using tobacco products. Taking birth control pills, especially if you smoke. Heavy alcohol and drug use. Not being active. What actions can I take to prevent this? Manage your health conditions High cholesterol. Eat a healthy diet. If this is not enough to manage your cholesterol, you may need to take medicines. Take medicines as told by your doctor. High blood pressure. Try to keep your blood pressure below 130/80. If your blood pressure cannot be managed through a healthy diet and regular exercise, you  may need to take medicines. Take medicines as told by your doctor. Ask your doctor if you should check your blood pressure at home. Have your blood pressure checked every year. Diabetes. Eat a healthy diet and get regular exercise. If your blood sugar (glucose) cannot be managed through diet and exercise, you may need to take medicines. Take medicines as told by your doctor. Talk to your doctor about  getting checked for sleeping problems. Signs of a problem can include: Snoring a lot. Feeling very tired. Make sure that you manage any other conditions you have. Nutrition  Follow instructions from your doctor about what to eat or drink. You may be told to: Eat and drink fewer calories each day. Limit how much salt (sodium) you use to 1,500 milligrams (mg) each day. Use only healthy fats for cooking, such as olive oil, canola oil, and sunflower oil. Eat healthy foods. To do this: Choose foods that are high in fiber. These include whole grains, and fresh fruits and vegetables. Eat at least 5 servings of fruits and vegetables a day. Try to fill one-half of your plate with fruits and vegetables at each meal. Choose low-fat (lean) proteins. These include low-fat cuts of meat, chicken without skin, fish, tofu, beans, and nuts. Eat low-fat dairy products. Avoid foods that: Are high in salt. Have saturated fat. Have trans fat. Have cholesterol. Are processed or pre-made. Count how many carbohydrates you eat and drink each day. Lifestyle If you drink alcohol: Limit how much you have to: 0-1 drink a day for women who are not pregnant. 0-2 drinks a day for men. Know how much alcohol is in your drink. In the [place] one drink equals one 12 oz bottle of beer ( ), one 5 oz glass of wine ( ), or one 1 oz glass of hard liquor (44mL). Do not smoke or use any products that have nicotine or tobacco. If you need help quitting, ask your doctor. Avoid secondhand smoke. Do not use drugs. Activity  Try to stay at a healthy weight. Get at least 30 minutes of exercise on most days, such as: Fast walking. Biking. Swimming. Medicines Take over-the-counter and prescription medicines only as told by your doctor. Avoid taking birth control pills. Talk to your doctor about the risks of taking birth control pills if: You are over 84 years old. You smoke. You get very bad headaches. You have had a  blood clot. Where to find more information American Stroke Association: www.strokeassociation.org Get help right away if: You or a loved one has any signs of a stroke. BE FAST is an easy way to remember the warning signs: B - Balance. Dizziness, sudden trouble walking, or loss of balance. E - Eyes. Trouble seeing or a change in how you see. F - Face. Sudden weakness or loss of feeling of the face. The face or eyelid may droop on one side. A - Arms. Weakness or loss of feeling in an arm. This happens all of a sudden and most often on one side of the body. S - Speech. Sudden trouble speaking, slurred speech, or trouble understanding what people say. T - Time. Time to call emergency services. Write down what time symptoms started. You or a loved one has other signs of a stroke, such as: A sudden, very bad headache with no known cause. Feeling like you may vomit (nausea). Vomiting. A seizure. These symptoms may be an emergency. Get help right away. Call your local emergency services (911 in the [place] Do  not wait to see if the symptoms will go away. Do not drive yourself to the hospital. Summary You can help to prevent a stroke by eating healthy, exercising, and not smoking. It also helps to manage any health problems you have. Do not smoke or use any products that contain nicotine or tobacco. Get help right away if you or a loved one has any signs of a stroke. This information is not intended to replace advice given to you by your health care provider. Make sure you discuss any questions you have with your health care provider. Document Revised: 11/20/2021 Document Reviewed: 11/20/2021 Elsevier Patient Education  2024 Elsevier Inc.     Emil Schaumann, MD [facility]

## 2023-07-16 NOTE — Assessment & Plan Note (Signed)
 Presently on atorvastatin 40 mg daily.

## 2023-07-16 NOTE — Patient Instructions (Signed)
 Stroke Prevention Some medical conditions and lifestyle choices can lead to a higher risk for a stroke. You can help to prevent a stroke by eating healthy foods and exercising. It also helps to not smoke and to manage any health problems you may have. How can this condition affect me? A stroke is an emergency. It should be treated right away. A stroke can lead to brain damage or threaten your life. There is a better chance of surviving and getting better after a stroke if you get medical help right away. What can increase my risk? The following medical conditions may increase your risk of a stroke: Diseases of the heart and blood vessels (cardiovascular disease). High blood pressure (hypertension). Diabetes. High cholesterol. Sickle cell disease. Problems with blood clotting. Being very overweight. Sleeping problems (obstructivesleep apnea). Other risk factors include: Being older than age 70. A history of blood clots, stroke, or mini-stroke (TIA). Race, ethnic background, or a family history of stroke. Smoking or using tobacco products. Taking birth control pills, especially if you smoke. Heavy alcohol and drug use. Not being active. What actions can I take to prevent this? Manage your health conditions High cholesterol. Eat a healthy diet. If this is not enough to manage your cholesterol, you may need to take medicines. Take medicines as told by your doctor. High blood pressure. Try to keep your blood pressure below 130/80. If your blood pressure cannot be managed through a healthy diet and regular exercise, you may need to take medicines. Take medicines as told by your doctor. Ask your doctor if you should check your blood pressure at home. Have your blood pressure checked every year. Diabetes. Eat a healthy diet and get regular exercise. If your blood sugar (glucose) cannot be managed through diet and exercise, you may need to take medicines. Take medicines as told by your  doctor. Talk to your doctor about getting checked for sleeping problems. Signs of a problem can include: Snoring a lot. Feeling very tired. Make sure that you manage any other conditions you have. Nutrition  Follow instructions from your doctor about what to eat or drink. You may be told to: Eat and drink fewer calories each day. Limit how much salt (sodium) you use to 1,500 milligrams (mg) each day. Use only healthy fats for cooking, such as olive oil, canola oil, and sunflower oil. Eat healthy foods. To do this: Choose foods that are high in fiber. These include whole grains, and fresh fruits and vegetables. Eat at least 5 servings of fruits and vegetables a day. Try to fill one-half of your plate with fruits and vegetables at each meal. Choose low-fat (lean) proteins. These include low-fat cuts of meat, chicken without skin, fish, tofu, beans, and nuts. Eat low-fat dairy products. Avoid foods that: Are high in salt. Have saturated fat. Have trans fat. Have cholesterol. Are processed or pre-made. Count how many carbohydrates you eat and drink each day. Lifestyle If you drink alcohol: Limit how much you have to: 0-1 drink a day for women who are not pregnant. 0-2 drinks a day for men. Know how much alcohol is in your drink. In the [place] one drink equals one 12 oz bottle of beer ( ), one 5 oz glass of wine ( ), or one 1 oz glass of hard liquor (44mL). Do not smoke or use any products that have nicotine or tobacco. If you need help quitting, ask your doctor. Avoid secondhand smoke. Do not use drugs. Activity  Try to stay at a  healthy weight. Get at least 30 minutes of exercise on most days, such as: Fast walking. Biking. Swimming. Medicines Take over-the-counter and prescription medicines only as told by your doctor. Avoid taking birth control pills. Talk to your doctor about the risks of taking birth control pills if: You are over 16 years old. You smoke. You get  very bad headaches. You have had a blood clot. Where to find more information American Stroke Association: www.strokeassociation.org Get help right away if: You or a loved one has any signs of a stroke. "BE FAST" is an easy way to remember the warning signs: B - Balance. Dizziness, sudden trouble walking, or loss of balance. E - Eyes. Trouble seeing or a change in how you see. F - Face. Sudden weakness or loss of feeling of the face. The face or eyelid may droop on one side. A - Arms. Weakness or loss of feeling in an arm. This happens all of a sudden and most often on one side of the body. S - Speech. Sudden trouble speaking, slurred speech, or trouble understanding what people say. T - Time. Time to call emergency services. Write down what time symptoms started. You or a loved one has other signs of a stroke, such as: A sudden, very bad headache with no known cause. Feeling like you may vomit (nausea). Vomiting. A seizure. These symptoms may be an emergency. Get help right away. Call your local emergency services (911 in the [place] Do not wait to see if the symptoms will go away. Do not drive yourself to the hospital. Summary You can help to prevent a stroke by eating healthy, exercising, and not smoking. It also helps to manage any health problems you have. Do not smoke or use any products that contain nicotine or tobacco. Get help right away if you or a loved one has any signs of a stroke. This information is not intended to replace advice given to you by your health care provider. Make sure you discuss any questions you have with your health care provider. Document Revised: 11/20/2021 Document Reviewed: 11/20/2021 Elsevier Patient Education  2024 [employer].

## 2023-07-16 NOTE — Assessment & Plan Note (Signed)
Stable and making progress.

## 2023-07-16 NOTE — Assessment & Plan Note (Signed)
 Secondary prevention measures discussed Not diabetic Well-controlled hypertension Presently on atorvastatin  40 mg daily Continues daily baby aspirin .  No longer on Brilinta  Scheduled for neurologist follow-up

## 2023-07-16 NOTE — Telephone Encounter (Signed)
 LVM for Jacqueline Bonilla from [place] regarding verbal orders

## 2023-07-16 NOTE — Assessment & Plan Note (Signed)
 Clinically stable.  Most likely secondary to recent TIA or previous stroke residual effect.  Recently started on Depakote  500 mg daily Will follow-up with neurologist

## 2023-07-16 NOTE — Assessment & Plan Note (Signed)
Stable chronic condition.  No concerns.

## 2023-07-16 NOTE — Assessment & Plan Note (Signed)
 Using walker and/or cane at all times Left foot deviates laterally when walking and at times trips on walker or cane on that particular side. Continues physical therapy Fall precautions given

## 2023-07-17 ENCOUNTER — Telehealth: Payer: Self-pay

## 2023-07-17 ENCOUNTER — Telehealth: Payer: Self-pay | Admitting: Emergency Medicine

## 2023-07-17 DIAGNOSIS — F0150 Vascular dementia without behavioral disturbance: Secondary | ICD-10-CM | POA: Diagnosis not present

## 2023-07-17 DIAGNOSIS — M549 Dorsalgia, unspecified: Secondary | ICD-10-CM | POA: Diagnosis not present

## 2023-07-17 DIAGNOSIS — M62421 Contracture of muscle, right upper arm: Secondary | ICD-10-CM | POA: Diagnosis not present

## 2023-07-17 DIAGNOSIS — I69392 Facial weakness following cerebral infarction: Secondary | ICD-10-CM | POA: Diagnosis not present

## 2023-07-17 DIAGNOSIS — I69393 Ataxia following cerebral infarction: Secondary | ICD-10-CM | POA: Diagnosis not present

## 2023-07-17 DIAGNOSIS — D649 Anemia, unspecified: Secondary | ICD-10-CM | POA: Diagnosis not present

## 2023-07-17 DIAGNOSIS — I69351 Hemiplegia and hemiparesis following cerebral infarction affecting right dominant side: Secondary | ICD-10-CM | POA: Diagnosis not present

## 2023-07-17 DIAGNOSIS — I10 Essential (primary) hypertension: Secondary | ICD-10-CM | POA: Diagnosis not present

## 2023-07-17 DIAGNOSIS — E785 Hyperlipidemia, unspecified: Secondary | ICD-10-CM | POA: Diagnosis not present

## 2023-07-17 NOTE — Telephone Encounter (Signed)
 Copied from CRM [id number]. Topic: Clinical - Home Health Verbal Orders >> Jul 17, 2023 11:25 AM Precious C wrote: Caller/Agency: April/[facility] Callback Number: [phone] Service Requested: Occupational Therapy, Home Health Aide& Skilled Nursing Frequency: Once a week for 3 weeks for [facility] and for OT- Eval Any new concerns about the patient? No

## 2023-07-17 NOTE — Telephone Encounter (Signed)
 Copied from CRM [id number]. Topic: Referral - Status >> Jul 17, 2023 11:45 AM Franky GRADE wrote: Reason for CRM: [facility] home health received orders for home health; however, were not able to schedule the patient as it had the facilities address where patient was staying. She is currently back home and the order needs to be updated with the patient's address and resubmitted.

## 2023-07-18 ENCOUNTER — Encounter: Admitting: Occupational Therapy

## 2023-07-18 ENCOUNTER — Ambulatory Visit: Admitting: Physical Therapy

## 2023-07-18 DIAGNOSIS — E785 Hyperlipidemia, unspecified: Secondary | ICD-10-CM | POA: Diagnosis not present

## 2023-07-18 DIAGNOSIS — I69392 Facial weakness following cerebral infarction: Secondary | ICD-10-CM | POA: Diagnosis not present

## 2023-07-18 DIAGNOSIS — I69351 Hemiplegia and hemiparesis following cerebral infarction affecting right dominant side: Secondary | ICD-10-CM | POA: Diagnosis not present

## 2023-07-18 DIAGNOSIS — I69393 Ataxia following cerebral infarction: Secondary | ICD-10-CM | POA: Diagnosis not present

## 2023-07-18 DIAGNOSIS — D649 Anemia, unspecified: Secondary | ICD-10-CM | POA: Diagnosis not present

## 2023-07-18 DIAGNOSIS — M549 Dorsalgia, unspecified: Secondary | ICD-10-CM | POA: Diagnosis not present

## 2023-07-18 DIAGNOSIS — M62421 Contracture of muscle, right upper arm: Secondary | ICD-10-CM | POA: Diagnosis not present

## 2023-07-18 DIAGNOSIS — I10 Essential (primary) hypertension: Secondary | ICD-10-CM | POA: Diagnosis not present

## 2023-07-18 DIAGNOSIS — F0150 Vascular dementia without behavioral disturbance: Secondary | ICD-10-CM | POA: Diagnosis not present

## 2023-07-19 DIAGNOSIS — I69351 Hemiplegia and hemiparesis following cerebral infarction affecting right dominant side: Secondary | ICD-10-CM | POA: Diagnosis not present

## 2023-07-19 DIAGNOSIS — I69392 Facial weakness following cerebral infarction: Secondary | ICD-10-CM | POA: Diagnosis not present

## 2023-07-19 DIAGNOSIS — M549 Dorsalgia, unspecified: Secondary | ICD-10-CM | POA: Diagnosis not present

## 2023-07-19 DIAGNOSIS — F0150 Vascular dementia without behavioral disturbance: Secondary | ICD-10-CM | POA: Diagnosis not present

## 2023-07-19 DIAGNOSIS — M62421 Contracture of muscle, right upper arm: Secondary | ICD-10-CM | POA: Diagnosis not present

## 2023-07-19 DIAGNOSIS — I10 Essential (primary) hypertension: Secondary | ICD-10-CM | POA: Diagnosis not present

## 2023-07-19 DIAGNOSIS — D649 Anemia, unspecified: Secondary | ICD-10-CM | POA: Diagnosis not present

## 2023-07-19 DIAGNOSIS — E785 Hyperlipidemia, unspecified: Secondary | ICD-10-CM | POA: Diagnosis not present

## 2023-07-19 DIAGNOSIS — I69393 Ataxia following cerebral infarction: Secondary | ICD-10-CM | POA: Diagnosis not present

## 2023-07-22 ENCOUNTER — Ambulatory Visit

## 2023-07-22 DIAGNOSIS — M549 Dorsalgia, unspecified: Secondary | ICD-10-CM | POA: Diagnosis not present

## 2023-07-22 DIAGNOSIS — D649 Anemia, unspecified: Secondary | ICD-10-CM | POA: Diagnosis not present

## 2023-07-22 DIAGNOSIS — I69351 Hemiplegia and hemiparesis following cerebral infarction affecting right dominant side: Secondary | ICD-10-CM | POA: Diagnosis not present

## 2023-07-22 DIAGNOSIS — M62421 Contracture of muscle, right upper arm: Secondary | ICD-10-CM | POA: Diagnosis not present

## 2023-07-22 DIAGNOSIS — E785 Hyperlipidemia, unspecified: Secondary | ICD-10-CM | POA: Diagnosis not present

## 2023-07-22 DIAGNOSIS — I69393 Ataxia following cerebral infarction: Secondary | ICD-10-CM | POA: Diagnosis not present

## 2023-07-22 DIAGNOSIS — I639 Cerebral infarction, unspecified: Secondary | ICD-10-CM

## 2023-07-22 DIAGNOSIS — F0150 Vascular dementia without behavioral disturbance: Secondary | ICD-10-CM | POA: Diagnosis not present

## 2023-07-22 DIAGNOSIS — I69392 Facial weakness following cerebral infarction: Secondary | ICD-10-CM | POA: Diagnosis not present

## 2023-07-22 DIAGNOSIS — I10 Essential (primary) hypertension: Secondary | ICD-10-CM | POA: Diagnosis not present

## 2023-07-22 NOTE — Telephone Encounter (Signed)
 Yes

## 2023-07-22 NOTE — Telephone Encounter (Signed)
 Spoke to April and verbal order was given.   Curtistine Quiet, CMA

## 2023-07-23 ENCOUNTER — Encounter: Admitting: Occupational Therapy

## 2023-07-23 LAB — CUP PACEART REMOTE DEVICE CHECK
Date Time Interrogation Session: 20250720230727
Implantable Pulse Generator Implant Date: 20211020

## 2023-07-23 NOTE — Telephone Encounter (Signed)
 Provide order as requested please.  Thanks.

## 2023-07-23 NOTE — Telephone Encounter (Signed)
**Note De-identified  [place] Obfuscation** Please advise 

## 2023-07-24 ENCOUNTER — Ambulatory Visit: Payer: Self-pay

## 2023-07-24 ENCOUNTER — Ambulatory Visit: Admitting: Emergency Medicine

## 2023-07-24 ENCOUNTER — Ambulatory Visit (INDEPENDENT_AMBULATORY_CARE_PROVIDER_SITE_OTHER)

## 2023-07-24 ENCOUNTER — Telehealth: Payer: Self-pay | Admitting: Emergency Medicine

## 2023-07-24 ENCOUNTER — Ambulatory Visit: Payer: Self-pay | Admitting: Emergency Medicine

## 2023-07-24 VITALS — BP 102/72 | HR 89 | Temp 98.4°F | Ht 64.0 in | Wt 117.0 lb

## 2023-07-24 DIAGNOSIS — Z8673 Personal history of transient ischemic attack (TIA), and cerebral infarction without residual deficits: Secondary | ICD-10-CM | POA: Diagnosis not present

## 2023-07-24 DIAGNOSIS — I69393 Ataxia following cerebral infarction: Secondary | ICD-10-CM | POA: Diagnosis not present

## 2023-07-24 DIAGNOSIS — I69351 Hemiplegia and hemiparesis following cerebral infarction affecting right dominant side: Secondary | ICD-10-CM | POA: Diagnosis not present

## 2023-07-24 DIAGNOSIS — I10 Essential (primary) hypertension: Secondary | ICD-10-CM | POA: Diagnosis not present

## 2023-07-24 DIAGNOSIS — M25572 Pain in left ankle and joints of left foot: Secondary | ICD-10-CM | POA: Diagnosis not present

## 2023-07-24 DIAGNOSIS — M549 Dorsalgia, unspecified: Secondary | ICD-10-CM | POA: Diagnosis not present

## 2023-07-24 DIAGNOSIS — I69392 Facial weakness following cerebral infarction: Secondary | ICD-10-CM | POA: Diagnosis not present

## 2023-07-24 DIAGNOSIS — S99912A Unspecified injury of left ankle, initial encounter: Secondary | ICD-10-CM

## 2023-07-24 DIAGNOSIS — I63512 Cerebral infarction due to unspecified occlusion or stenosis of left middle cerebral artery: Secondary | ICD-10-CM

## 2023-07-24 DIAGNOSIS — E785 Hyperlipidemia, unspecified: Secondary | ICD-10-CM | POA: Diagnosis not present

## 2023-07-24 DIAGNOSIS — R296 Repeated falls: Secondary | ICD-10-CM

## 2023-07-24 DIAGNOSIS — F0150 Vascular dementia without behavioral disturbance: Secondary | ICD-10-CM | POA: Diagnosis not present

## 2023-07-24 DIAGNOSIS — M62421 Contracture of muscle, right upper arm: Secondary | ICD-10-CM | POA: Diagnosis not present

## 2023-07-24 DIAGNOSIS — D649 Anemia, unspecified: Secondary | ICD-10-CM | POA: Diagnosis not present

## 2023-07-24 NOTE — Telephone Encounter (Signed)
 FYI Only or Action Required?: Action required by provider: request for appointment.  Patient was last seen in primary care on 07/16/2023 by Purcell Emil Schanz, MD.  Called Nurse Triage reporting Fall.  Symptoms began a week ago.  Interventions attempted: Nothing.  Symptoms are: gradually worsening.  Triage Disposition: See HCP Within 4 Hours (Or PCP Triage)  Patient/caregiver understands and will follow disposition?: YesCopied from CRM [id number]. Topic: Clinical - Red Word Triage >> Jul 24, 2023 10:38 AM Gennette ORN wrote: Red Word that prompted transfer to Nurse Triage: Patient has swelling in her legs it is twice the size as other one. She fell on 07/14/23. Reason for Disposition  [1] MODERATE weakness (e.g., interferes with work, school, normal activities) AND [2] new-onset or getting worse  Answer Assessment - Initial Assessment Questions 1. MECHANISM: How did the fall happen?     balance  3. ONSET: When did the fall happen? (e.g., minutes, hours, or days ago)     Up to 9 days ago  4. LOCATION: What part of the body hit the ground? (e.g., back, buttocks, head, hips, knees, hands, head, stomach)     Not sure   6. PAIN: Is there any pain? If Yes, ask: How bad is the pain? (e.g., Scale 0-10; or none, mild,      unsure 7. SIZE: For cuts, bruises, or swelling, ask: How large is it? (e.g., inches or centimeters)      Swollen at ankle joint  9. OTHER SYMPTOMS: Do you have any other symptoms? (e.g., dizziness, fever, weakness; new-onset or worsening).      denies 10. CAUSE: What do you think caused the fall (or falling)? (e.g., dizzy spell, tripped)       balance  April from [facility] called with concerns of left ankle. Husband stated pt  has fallen 6x in last 9 days due to balance. Left ankle joint is 2x size as right ankle. No visible bruising.Husband feels 2-3 falls her leg could've been landed on .  Only painful when walking or bending it.  Protocols used: Falls  and [facility]

## 2023-07-24 NOTE — Telephone Encounter (Unsigned)
 Copied from CRM [id number]. Topic: Clinical - Request for Lab/Test Order >> Jul 24, 2023 10:32 AM Gennette ORN wrote: Reason for CRM: April from [facility] her number is [phone]. Patient is requesting Xray testing.

## 2023-07-24 NOTE — Assessment & Plan Note (Signed)
 Frequent falls over the last several months. 5 or 6 over the last 2 weeks In the past she has sustained fracture of left arm and left humerus She also has a history of strokes with residual right-sided motor deficit Difficult to handle walker and or cane At this point recommend motorized wheelchair to avoid any further falls and injuries.  It is absolutely medically necessary.

## 2023-07-24 NOTE — Patient Instructions (Signed)
Ankle Sprain  An ankle sprain is a stretch or tear in a ligament in your ankle. Ligaments are tissues that connect bones to each other.  An ankle sprain can happen when: The ankle rolls outward. This is called an inversion sprain. The ankle rolls inward. This is called an eversion sprain. What are the causes? An ankle sprain is caused by rolling or twisting your ankle. What increases the risk? You are more likely to get an ankle sprain if you play sports. What are the signs or symptoms?  Pain in your ankle. Swelling. Bruising. Bruises may form right after you sprain your ankle or 1-2 days later. Trouble standing or walking. How is this treated? An ankle sprain may be treated with: A brace or splint. This is used to keep the ankle from moving until it heals. An elastic bandage (dressing). This is used to support the ankle. Crutches. Pain medicine. Surgery. This may be needed if the sprain is very bad. Physical therapy. This can help you move your ankle better. Follow these instructions at home: If you have a brace or a splint that can be taken off: Wear the brace or splint as told by your doctor. Take it off only as told by your doctor. Check the skin around the brace or splint every day. Tell your doctor if you see problems. Loosen the brace or splint if your toes: Tingle. Become numb. Turn cold and blue. Keep the brace or splint clean and dry. If the brace or splint is not waterproof: Do not let it get wet. Cover it with a watertight covering when you take a bath or a shower. If you have an elastic dressing: Take it off to shower or bathe. Adjust it if it feels too tight. Loosen the dressing if your foot: Tingles. Becomes numb. Turns cold and blue. Managing pain, stiffness, and swelling If told, put ice on the affected area. If you have a removable brace or splint, take it off as told by your doctor. Put ice in a plastic bag. Place a towel between your skin and the  bag. Leave the ice on for 20 minutes, 2-3 times a day. If your skin turns bright red, take off the ice right away to prevent skin damage. The risk of damage is higher if you cannot feel pain, heat, or cold. Move your toes often. Raise your ankle above the level of your heart while you are sitting or lying down. General instructions Take over-the-counter and prescription medicines only as told by your doctor. Do not smoke or use any products that contain nicotine or tobacco. If you need help quitting, ask your doctor. Rest your ankle. Use crutches to support your body weight. Do not use your injured leg to support your body weight until your doctor says that you can. Ask your doctor when it is safe to drive if you have a brace or splint on your ankle. Contact a doctor if: Your bruises or swelling get worse all of a sudden. Your pain does not get better after you take medicine. Get help right away if: Your foot or toes are numb or blue. You have very bad pain that gets worse. This information is not intended to replace advice given to you by your health care provider. Make sure you discuss any questions you have with your health care provider. Document Revised: 09/20/2021 Document Reviewed: 09/20/2021 Elsevier Patient Education  2024 [employer].

## 2023-07-25 ENCOUNTER — Encounter: Payer: Self-pay | Admitting: Emergency Medicine

## 2023-07-25 ENCOUNTER — Encounter: Admitting: Occupational Therapy

## 2023-07-25 DIAGNOSIS — I69392 Facial weakness following cerebral infarction: Secondary | ICD-10-CM | POA: Diagnosis not present

## 2023-07-25 DIAGNOSIS — I69393 Ataxia following cerebral infarction: Secondary | ICD-10-CM | POA: Diagnosis not present

## 2023-07-25 DIAGNOSIS — S99912A Unspecified injury of left ankle, initial encounter: Secondary | ICD-10-CM | POA: Insufficient documentation

## 2023-07-25 DIAGNOSIS — M62421 Contracture of muscle, right upper arm: Secondary | ICD-10-CM | POA: Diagnosis not present

## 2023-07-25 DIAGNOSIS — D649 Anemia, unspecified: Secondary | ICD-10-CM | POA: Diagnosis not present

## 2023-07-25 DIAGNOSIS — I69351 Hemiplegia and hemiparesis following cerebral infarction affecting right dominant side: Secondary | ICD-10-CM | POA: Diagnosis not present

## 2023-07-25 DIAGNOSIS — F0150 Vascular dementia without behavioral disturbance: Secondary | ICD-10-CM | POA: Diagnosis not present

## 2023-07-25 DIAGNOSIS — E785 Hyperlipidemia, unspecified: Secondary | ICD-10-CM | POA: Diagnosis not present

## 2023-07-25 DIAGNOSIS — M549 Dorsalgia, unspecified: Secondary | ICD-10-CM | POA: Diagnosis not present

## 2023-07-25 DIAGNOSIS — I10 Essential (primary) hypertension: Secondary | ICD-10-CM | POA: Diagnosis not present

## 2023-07-25 NOTE — Assessment & Plan Note (Signed)
 Secondary prevention measures discussed Not diabetic Well-controlled hypertension Presently on atorvastatin  40 mg daily Continues daily baby aspirin .  No longer on Brilinta  Scheduled for neurologist follow-up

## 2023-07-25 NOTE — Assessment & Plan Note (Signed)
 Presently on atorvastatin 40 mg daily.

## 2023-07-25 NOTE — Assessment & Plan Note (Signed)
 Most recent injury secondary to fall. Stable physical findings. X-ray does not show fracture Able to walk and put pressure on it We will start process to obtain electrical wheelchair which is of paramount importance to avoid further injuries.

## 2023-07-25 NOTE — Assessment & Plan Note (Addendum)
 BP Readings from Last 3 Encounters:  07/24/23 102/72  07/16/23 124/82  07/02/23 (!) 134/91  Well-controlled hypertension Continues losartan  12.5 mg daily

## 2023-07-25 NOTE — Progress Notes (Signed)
 Jacqueline Bonilla 68 y.o.   Chief Complaint  Patient presents with   Fall    Patient here for left ankle injury. Husband states she's had 6 falls this week, she does not remember the last fall that happened 4 days ago, Rn noticed swelling on left ankle, does not hurt to walk on but hurts to move around.     HISTORY OF PRESENT ILLNESS: This is a 68 y.o. female accompanied by husband who states she has fallen 6 times in the past couple weeks. Most recent fall led to left ankle injury.  Has some swelling but able to walk on it. Patient ambulates with both walker and cane but trips on both making them a safety hazard. No other injuries.  No other complaints or medical concerns today. Patient has history of stroke with residual right-sided deficits.  Fall Pertinent negatives include no abdominal pain, fever, headaches, hematuria, nausea or vomiting.     Prior to Admission medications   Medication Sig Start Date End Date Taking? Authorizing Provider  Amino Acids  Complex TABS Take 1 tablet by mouth daily.   Yes [provider]  aspirin  81 MG chewable tablet Chew 1 tablet (81 mg total) by mouth daily. 04/02/23  Yes Shafer, Jorene, NP  atorvastatin  (LIPITOR) 40 MG tablet Take 1 tablet (40 mg total) by mouth daily. 05/29/23  Yes SagardiaEmil Schanz, MD  CINNAMON  PO Take 1 capsule by mouth daily.   Yes [provider]  Coenzyme Q10 (CO Q 10 PO) Take 1 capsule by mouth daily.   Yes [provider]  divalproex  (DEPAKOTE  ER) 500 MG 24 hr tablet Take 1 tablet (500 mg total) by mouth daily. 07/16/23 07/15/24 Yes Pierrette Scheu, Emil Schanz, MD  losartan  (COZAAR ) 25 MG tablet Take 0.5 tablets (12.5 mg total) by mouth daily. 04/08/23  Yes Angiulli, Toribio PARAS, PA-C  MAGNESIUM  PO Take 1 tablet by mouth at bedtime.   Yes [provider]  Multiple Vitamin (MULTIVITAMIN WITH MINERALS) TABS tablet Take 1 tablet by mouth daily.   Yes [provider]  Multiple  Vitamins-Minerals (ZINC  PO) Take 1 tablet by mouth daily.   Yes [provider]  Study - LIBREXIA-STROKE - milvexian 25 mg or placebo tablet (PI-Sethi) Take 1 tablet by mouth 2 (two) times daily. 06/26/23  Yes Rosemarie Eather RAMAN, MD    Allergies  Allergen Reactions   Iohexol  Other (See Comments)     Code: HIVES, Desc: HIVES S/P IVP MANY YRS AGO    Plavix  [Clopidogrel ] Diarrhea   Quinolones Hives    Cipro, Levaquin, Avelox all cause hives   Shellfish Allergy Itching    Itching under the chin. Described by patient but not seen by husband.    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   Frequent falls 02/11/2023   Statin intolerance 04/24/2022   Internal hemorrhoids 01/10/2021   Dyslipidemia 12/29/2020   Seizure-like activity (HCC) 08/30/2020   History of CVA (cerebrovascular accident) 07/14/2020   Normocytic anemia 07/11/2020   Vascular dementia without behavioral disturbance (HCC) 07/15/2019   History of COVID-19 04/22/2019   Essential hypertension 03/03/2019    Past Medical History:  Diagnosis Date   Anemia    Anxiety  Depression    Dysmenorrhea    Endometriosis    Fibroid    Hypertension    Implantable loop recorder present 2021   Microhematuria    negative workup   Osteoporosis    Tachycardia    Thrombocytopenia (HCC)     Past Surgical History:  Procedure Laterality Date   BREAST BIOPSY     BUBBLE STUDY  12/08/2020   Procedure: BUBBLE STUDY;  Surgeon: Delford Maude BROCKS, MD;  Location: [facility] ENDOSCOPY;  Service: Cardiovascular;;   CESAREAN SECTION     hysteroscopic resection     implantable loop recorder implant  10/21/2019   Medtronic  Reveal Linq model LNQ 22 ([place] [id number]) implantable loop recorder   IR CT HEAD LTD  12/01/2020   IR PERCUTANEOUS ART THROMBECTOMY/INFUSION INTRACRANIAL INC DIAG ANGIO  12/01/2020   IR US  GUIDE VASC ACCESS RIGHT  12/01/2020   ORIF HUMERUS FRACTURE Left 07/31/2021   Procedure: OPEN REDUCTION INTERNAL FIXATION (ORIF) DISTAL HUMERUS FRACTURE;  Surgeon: Kendal Franky SQUIBB, MD;  Location: [facility] OR;  Service: Orthopedics;  Laterality: Left;   RADIOLOGY WITH ANESTHESIA N/A 12/01/2020   Procedure: IR WITH ANESTHESIA - CODE STROKE;  Surgeon: Radiologist, Medication, MD;  Location: [facility] OR;  Service: Radiology;  Laterality: N/A;   TEE WITHOUT CARDIOVERSION N/A 12/08/2020   Procedure: TRANSESOPHAGEAL ECHOCARDIOGRAM (TEE);  Surgeon: Delford Maude BROCKS, MD;  Location: [facility] ENDOSCOPY;  Service: Cardiovascular;  Laterality: N/A;    Social History   Socioeconomic History   Marital status: Married    Spouse name: Actor   Number of children: 1   Years of education: Not on file   Highest education level: Not on file  Occupational History   Occupation: accounting   Occupation: Retired  Tobacco Use   Smoking status: Never   Smokeless tobacco: Never  Vaping Use   Vaping status: Never Used  Substance and Sexual Activity   Alcohol use: Never   Drug use: Never   Sexual activity: Not Currently    Partners: Male    Comment: husband vasectomy  Other Topics Concern   Not on file  Social History Narrative   Lives with husband   Grandchildren - 1   Works - Biochemist, 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%   Gun in home - yes - secured      Right handed   Caffeine: maybe tea every now and then   Social Drivers of [employer] Strain: Low Risk  (10/23/2022)   Overall Financial Resource Strain (CARDIA)    Difficulty of Paying Living Expenses: Not hard at all  Food Insecurity: No Food Insecurity (07/02/2023)   Hunger Vital Sign    Worried About Running Out of Food in the Last Year: Never true    Ran Out of Food in the  Last Year: Never true  Transportation Needs: No Transportation Needs (07/02/2023)   PRAPARE - Administrator, Civil Service (Medical): No    Lack of Transportation (Non-Medical): No  Physical Activity: Sufficiently Active (10/23/2022)   Exercise Vital Sign    Days of Exercise per Week: 6 days    Minutes of Exercise per Session: 40 min  Stress: No Stress Concern Present (08/28/2021)   [employer] of Occupational Health - Occupational Stress Questionnaire    Feeling of Stress : Not at all  Social Connections: Moderately Isolated (07/02/2023)   Social Connection and Isolation Panel    Frequency of Communication with Friends and Family: Once a  week    Frequency of Social Gatherings with Friends and Family: Once a week    Attends Religious Services: Never    Database administrator or Organizations: Yes    Attends Banker Meetings: Never    Marital Status: Married  Catering manager Violence: Not At Risk (07/02/2023)   Humiliation, Afraid, Rape, and Kick questionnaire    Fear of Current or Ex-Partner: No    Emotionally Abused: No    Physically Abused: No    Sexually Abused: No    Family History  Problem Relation Age of Onset   Cancer Father        non hodgkin lymphoma & skin   Heart attack Maternal Grandfather    Dementia Mother    Polymyositis Sister      Review of Systems  Constitutional: Negative.  Negative for chills and fever.  HENT:  Negative for congestion and sore throat.   Respiratory: Negative.  Negative for cough and shortness of breath.   Cardiovascular: Negative.  Negative for chest pain.  Gastrointestinal:  Negative for abdominal pain, diarrhea, nausea and vomiting.  Genitourinary: Negative.  Negative for dysuria and hematuria.  Skin: Negative.  Negative for rash.  Neurological: Negative.  Negative for dizziness and headaches.  All other systems reviewed and are negative.   Vitals:   07/24/23 1400  BP: 102/72  Pulse: 89  Temp: 98.4  F (36.9 C)  SpO2: 99%    Physical Exam Vitals reviewed.  Constitutional:      Appearance: Normal appearance.  HENT:     Head: Normocephalic.     Mouth/Throat:     Mouth: Mucous membranes are moist.     Pharynx: Oropharynx is clear.  Eyes:     Extraocular Movements: Extraocular movements intact.     Pupils: Pupils are equal, round, and reactive to light.  Cardiovascular:     Rate and Rhythm: Normal rate and regular rhythm.     Pulses: Normal pulses.     Heart sounds: Normal heart sounds.  Pulmonary:     Effort: Pulmonary effort is normal.     Breath sounds: Normal breath sounds.  Musculoskeletal:     Cervical back: No tenderness.     Comments: Left ankle: Mild swelling but no significant tenderness.  Full range of motion.  Able to put pressure on it  Lymphadenopathy:     Cervical: No cervical adenopathy.  Skin:    General: Skin is warm and dry.  Neurological:     Mental Status: She is alert and oriented to person, place, and time. Mental status is at baseline.  Psychiatric:        Mood and Affect: Mood normal.        Behavior: Behavior normal.    DG Ankle Complete Left Result Date: 07/24/2023 CLINICAL DATA:  Left ankle injury 1 week ago.  Ankle pain. EXAM: LEFT ANKLE COMPLETE - 3+ VIEW COMPARISON:  None Available. FINDINGS: There is no evidence of fracture, dislocation, or joint effusion. There is no evidence of arthropathy or other focal bone abnormality. Soft tissues are unremarkable. IMPRESSION: Negative. Electronically Signed   By: Norleen DELENA Kil M.D.   On: 07/24/2023 14:49     ASSESSMENT & PLAN: A total of 45 minutes was spent with the patient and counseling/coordination of care regarding preparing for this visit, review of most recent office visit notes, review of all medications and chronic medical conditions under management, assessment and management of left ankle injury, review of x-ray images  done today, discussion about need for electrical wheelchair, prognosis,  documentation and need for follow-up.  Problem List Items Addressed This Visit       Cardiovascular and Mediastinum   Essential hypertension   BP Readings from Last 3 Encounters:  07/24/23 102/72  07/16/23 124/82  07/02/23 (!) 134/91  Well-controlled hypertension Continues losartan  12.5 mg daily       RESOLVED: Left middle cerebral artery stroke [facility])   Relevant Orders   DME Wheelchair electric     Nervous and Auditory   Vascular dementia without behavioral disturbance (HCC)   Stable.  At baseline.  No concerns.  Interacts with husband very well      Hemiparesis affecting right side as late effect of cerebrovascular accident (CVA) (HCC)   Is significantly affecting her ability to handle walker or cane Also affecting gait and stability        Other   History of CVA (cerebrovascular accident)   Secondary prevention measures discussed Not diabetic Well-controlled hypertension Presently on atorvastatin  40 mg daily Continues daily baby aspirin .  No longer on Brilinta  Scheduled for neurologist follow-up      Dyslipidemia   Presently on atorvastatin  40 mg daily.       Frequent falls - Primary   Frequent falls over the last several months. 5 or 6 over the last 2 weeks In the past she has sustained fracture of left arm and left humerus She also has a history of strokes with residual right-sided motor deficit Difficult to handle walker and or cane At this point recommend motorized wheelchair to avoid any further falls and injuries.  It is absolutely medically necessary.      Relevant Orders   DME Wheelchair electric   Injury of left ankle   Most recent injury secondary to fall. Stable physical findings. X-ray does not show fracture Able to walk and put pressure on it We will start process to obtain electrical wheelchair which is of paramount importance to avoid further injuries.      Relevant Orders   DG Ankle Complete Left (Completed)   DME Wheelchair electric    Patient Instructions  Ankle Sprain  An ankle sprain is a stretch or tear in a ligament in your ankle. Ligaments are tissues that connect bones to each other.  An ankle sprain can happen when: The ankle rolls outward. This is called an inversion sprain. The ankle rolls inward. This is called an eversion sprain. What are the causes? An ankle sprain is caused by rolling or twisting your ankle. What increases the risk? You are more likely to get an ankle sprain if you play sports. What are the signs or symptoms?  Pain in your ankle. Swelling. Bruising. Bruises may form right after you sprain your ankle or 1-2 days later. Trouble standing or walking. How is this treated? An ankle sprain may be treated with: A brace or splint. This is used to keep the ankle from moving until it heals. An elastic bandage (dressing). This is used to support the ankle. Crutches. Pain medicine. Surgery. This may be needed if the sprain is very bad. Physical therapy. This can help you move your ankle better. Follow these instructions at home: If you have a brace or a splint that can be taken off: Wear the brace or splint as told by your doctor. Take it off only as told by your doctor. Check the skin around the brace or splint every day. Tell your doctor if you see problems. Loosen the  brace or splint if your toes: Tingle. Become numb. Turn cold and blue. Keep the brace or splint clean and dry. If the brace or splint is not waterproof: Do not let it get wet. Cover it with a watertight covering when you take a bath or a shower. If you have an elastic dressing: Take it off to shower or bathe. Adjust it if it feels too tight. Loosen the dressing if your foot: Tingles. Becomes numb. Turns cold and blue. Managing pain, stiffness, and swelling If told, put ice on the affected area. If you have a removable brace or splint, take it off as told by your doctor. Put ice in a plastic bag. Place a towel  between your skin and the bag. Leave the ice on for 20 minutes, 2-3 times a day. If your skin turns bright red, take off the ice right away to prevent skin damage. The risk of damage is higher if you cannot feel pain, heat, or cold. Move your toes often. Raise your ankle above the level of your heart while you are sitting or lying down. General instructions Take over-the-counter and prescription medicines only as told by your doctor. Do not smoke or use any products that contain nicotine or tobacco. If you need help quitting, ask your doctor. Rest your ankle. Use crutches to support your body weight. Do not use your injured leg to support your body weight until your doctor says that you can. Ask your doctor when it is safe to drive if you have a brace or splint on your ankle. Contact a doctor if: Your bruises or swelling get worse all of a sudden. Your pain does not get better after you take medicine. Get help right away if: Your foot or toes are numb or blue. You have very bad pain that gets worse. This information is not intended to replace advice given to you by your health care provider. Make sure you discuss any questions you have with your health care provider. Document Revised: 09/20/2021 Document Reviewed: 09/20/2021 Elsevier Patient Education  2024 Elsevier Inc.     Emil Schaumann, MD [facility]

## 2023-07-25 NOTE — Telephone Encounter (Signed)
 Patient was in office and has completed Xrays with results

## 2023-07-25 NOTE — Assessment & Plan Note (Signed)
 Stable.  At baseline.  No concerns.  Interacts with husband very well

## 2023-07-25 NOTE — Assessment & Plan Note (Signed)
 Is significantly affecting her ability to handle walker or cane Also affecting gait and stability

## 2023-07-29 DIAGNOSIS — M62421 Contracture of muscle, right upper arm: Secondary | ICD-10-CM | POA: Diagnosis not present

## 2023-07-29 DIAGNOSIS — I69351 Hemiplegia and hemiparesis following cerebral infarction affecting right dominant side: Secondary | ICD-10-CM | POA: Diagnosis not present

## 2023-07-29 DIAGNOSIS — F0150 Vascular dementia without behavioral disturbance: Secondary | ICD-10-CM | POA: Diagnosis not present

## 2023-07-29 DIAGNOSIS — I10 Essential (primary) hypertension: Secondary | ICD-10-CM | POA: Diagnosis not present

## 2023-07-29 DIAGNOSIS — I69392 Facial weakness following cerebral infarction: Secondary | ICD-10-CM | POA: Diagnosis not present

## 2023-07-29 DIAGNOSIS — I69393 Ataxia following cerebral infarction: Secondary | ICD-10-CM | POA: Diagnosis not present

## 2023-07-29 DIAGNOSIS — D649 Anemia, unspecified: Secondary | ICD-10-CM | POA: Diagnosis not present

## 2023-07-29 DIAGNOSIS — M549 Dorsalgia, unspecified: Secondary | ICD-10-CM | POA: Diagnosis not present

## 2023-07-29 DIAGNOSIS — E785 Hyperlipidemia, unspecified: Secondary | ICD-10-CM | POA: Diagnosis not present

## 2023-07-30 ENCOUNTER — Encounter: Admitting: Occupational Therapy

## 2023-07-30 ENCOUNTER — Ambulatory Visit: Admitting: Physical Therapy

## 2023-07-31 ENCOUNTER — Ambulatory Visit: Payer: Self-pay | Admitting: Cardiology

## 2023-07-31 DIAGNOSIS — M549 Dorsalgia, unspecified: Secondary | ICD-10-CM | POA: Diagnosis not present

## 2023-07-31 DIAGNOSIS — E785 Hyperlipidemia, unspecified: Secondary | ICD-10-CM | POA: Diagnosis not present

## 2023-07-31 DIAGNOSIS — F0150 Vascular dementia without behavioral disturbance: Secondary | ICD-10-CM | POA: Diagnosis not present

## 2023-07-31 DIAGNOSIS — M62421 Contracture of muscle, right upper arm: Secondary | ICD-10-CM | POA: Diagnosis not present

## 2023-07-31 DIAGNOSIS — I10 Essential (primary) hypertension: Secondary | ICD-10-CM | POA: Diagnosis not present

## 2023-07-31 DIAGNOSIS — I69393 Ataxia following cerebral infarction: Secondary | ICD-10-CM | POA: Diagnosis not present

## 2023-07-31 DIAGNOSIS — I69351 Hemiplegia and hemiparesis following cerebral infarction affecting right dominant side: Secondary | ICD-10-CM | POA: Diagnosis not present

## 2023-07-31 DIAGNOSIS — I69392 Facial weakness following cerebral infarction: Secondary | ICD-10-CM | POA: Diagnosis not present

## 2023-07-31 DIAGNOSIS — D649 Anemia, unspecified: Secondary | ICD-10-CM | POA: Diagnosis not present

## 2023-08-01 ENCOUNTER — Encounter: Admitting: Occupational Therapy

## 2023-08-01 ENCOUNTER — Ambulatory Visit: Admitting: Physical Therapy

## 2023-08-01 DIAGNOSIS — M549 Dorsalgia, unspecified: Secondary | ICD-10-CM | POA: Diagnosis not present

## 2023-08-01 DIAGNOSIS — F0150 Vascular dementia without behavioral disturbance: Secondary | ICD-10-CM | POA: Diagnosis not present

## 2023-08-01 DIAGNOSIS — I69393 Ataxia following cerebral infarction: Secondary | ICD-10-CM | POA: Diagnosis not present

## 2023-08-01 DIAGNOSIS — E785 Hyperlipidemia, unspecified: Secondary | ICD-10-CM | POA: Diagnosis not present

## 2023-08-01 DIAGNOSIS — D649 Anemia, unspecified: Secondary | ICD-10-CM | POA: Diagnosis not present

## 2023-08-01 DIAGNOSIS — I10 Essential (primary) hypertension: Secondary | ICD-10-CM | POA: Diagnosis not present

## 2023-08-01 DIAGNOSIS — M62421 Contracture of muscle, right upper arm: Secondary | ICD-10-CM | POA: Diagnosis not present

## 2023-08-01 DIAGNOSIS — I69351 Hemiplegia and hemiparesis following cerebral infarction affecting right dominant side: Secondary | ICD-10-CM | POA: Diagnosis not present

## 2023-08-01 DIAGNOSIS — I69392 Facial weakness following cerebral infarction: Secondary | ICD-10-CM | POA: Diagnosis not present

## 2023-08-02 ENCOUNTER — Other Ambulatory Visit: Payer: Self-pay | Admitting: Radiology

## 2023-08-02 DIAGNOSIS — Z8673 Personal history of transient ischemic attack (TIA), and cerebral infarction without residual deficits: Secondary | ICD-10-CM

## 2023-08-02 DIAGNOSIS — I69351 Hemiplegia and hemiparesis following cerebral infarction affecting right dominant side: Secondary | ICD-10-CM

## 2023-08-05 DIAGNOSIS — D649 Anemia, unspecified: Secondary | ICD-10-CM | POA: Diagnosis not present

## 2023-08-05 DIAGNOSIS — I69351 Hemiplegia and hemiparesis following cerebral infarction affecting right dominant side: Secondary | ICD-10-CM | POA: Diagnosis not present

## 2023-08-05 DIAGNOSIS — M62421 Contracture of muscle, right upper arm: Secondary | ICD-10-CM | POA: Diagnosis not present

## 2023-08-05 DIAGNOSIS — I69392 Facial weakness following cerebral infarction: Secondary | ICD-10-CM | POA: Diagnosis not present

## 2023-08-05 DIAGNOSIS — I10 Essential (primary) hypertension: Secondary | ICD-10-CM | POA: Diagnosis not present

## 2023-08-05 DIAGNOSIS — E785 Hyperlipidemia, unspecified: Secondary | ICD-10-CM | POA: Diagnosis not present

## 2023-08-05 DIAGNOSIS — I69393 Ataxia following cerebral infarction: Secondary | ICD-10-CM | POA: Diagnosis not present

## 2023-08-05 DIAGNOSIS — F0150 Vascular dementia without behavioral disturbance: Secondary | ICD-10-CM | POA: Diagnosis not present

## 2023-08-05 DIAGNOSIS — M549 Dorsalgia, unspecified: Secondary | ICD-10-CM | POA: Diagnosis not present

## 2023-08-06 ENCOUNTER — Encounter: Admitting: Occupational Therapy

## 2023-08-06 ENCOUNTER — Ambulatory Visit: Admitting: Physical Therapy

## 2023-08-08 ENCOUNTER — Encounter: Admitting: Occupational Therapy

## 2023-08-08 ENCOUNTER — Ambulatory Visit: Admitting: Physical Therapy

## 2023-08-08 DIAGNOSIS — M549 Dorsalgia, unspecified: Secondary | ICD-10-CM | POA: Diagnosis not present

## 2023-08-08 DIAGNOSIS — D649 Anemia, unspecified: Secondary | ICD-10-CM | POA: Diagnosis not present

## 2023-08-08 DIAGNOSIS — I69393 Ataxia following cerebral infarction: Secondary | ICD-10-CM | POA: Diagnosis not present

## 2023-08-08 DIAGNOSIS — E785 Hyperlipidemia, unspecified: Secondary | ICD-10-CM | POA: Diagnosis not present

## 2023-08-08 DIAGNOSIS — M62421 Contracture of muscle, right upper arm: Secondary | ICD-10-CM | POA: Diagnosis not present

## 2023-08-08 DIAGNOSIS — I69392 Facial weakness following cerebral infarction: Secondary | ICD-10-CM | POA: Diagnosis not present

## 2023-08-08 DIAGNOSIS — I69351 Hemiplegia and hemiparesis following cerebral infarction affecting right dominant side: Secondary | ICD-10-CM | POA: Diagnosis not present

## 2023-08-08 DIAGNOSIS — I10 Essential (primary) hypertension: Secondary | ICD-10-CM | POA: Diagnosis not present

## 2023-08-08 DIAGNOSIS — F0150 Vascular dementia without behavioral disturbance: Secondary | ICD-10-CM | POA: Diagnosis not present

## 2023-08-12 ENCOUNTER — Other Ambulatory Visit: Payer: Self-pay | Admitting: Radiology

## 2023-08-12 DIAGNOSIS — R296 Repeated falls: Secondary | ICD-10-CM

## 2023-08-12 DIAGNOSIS — Z8673 Personal history of transient ischemic attack (TIA), and cerebral infarction without residual deficits: Secondary | ICD-10-CM

## 2023-08-13 ENCOUNTER — Encounter: Admitting: Occupational Therapy

## 2023-08-14 DIAGNOSIS — I69392 Facial weakness following cerebral infarction: Secondary | ICD-10-CM | POA: Diagnosis not present

## 2023-08-14 DIAGNOSIS — D649 Anemia, unspecified: Secondary | ICD-10-CM | POA: Diagnosis not present

## 2023-08-14 DIAGNOSIS — M549 Dorsalgia, unspecified: Secondary | ICD-10-CM | POA: Diagnosis not present

## 2023-08-14 DIAGNOSIS — F0150 Vascular dementia without behavioral disturbance: Secondary | ICD-10-CM | POA: Diagnosis not present

## 2023-08-14 DIAGNOSIS — E785 Hyperlipidemia, unspecified: Secondary | ICD-10-CM | POA: Diagnosis not present

## 2023-08-14 DIAGNOSIS — I69351 Hemiplegia and hemiparesis following cerebral infarction affecting right dominant side: Secondary | ICD-10-CM | POA: Diagnosis not present

## 2023-08-14 DIAGNOSIS — M62421 Contracture of muscle, right upper arm: Secondary | ICD-10-CM | POA: Diagnosis not present

## 2023-08-14 DIAGNOSIS — I69393 Ataxia following cerebral infarction: Secondary | ICD-10-CM | POA: Diagnosis not present

## 2023-08-14 DIAGNOSIS — I10 Essential (primary) hypertension: Secondary | ICD-10-CM | POA: Diagnosis not present

## 2023-08-15 ENCOUNTER — Encounter (HOSPITAL_COMMUNITY): Payer: Self-pay | Admitting: Internal Medicine

## 2023-08-15 ENCOUNTER — Inpatient Hospital Stay (HOSPITAL_COMMUNITY)

## 2023-08-15 ENCOUNTER — Encounter: Admitting: Occupational Therapy

## 2023-08-15 ENCOUNTER — Inpatient Hospital Stay (HOSPITAL_COMMUNITY)
Admission: EM | Admit: 2023-08-15 | Discharge: 2023-08-26 | DRG: 100 | Disposition: A | Discharge status: Skilled Nursing Facility | Attending: Family Medicine | Admitting: Family Medicine

## 2023-08-15 ENCOUNTER — Emergency Department (HOSPITAL_COMMUNITY)

## 2023-08-15 DIAGNOSIS — Z91148 Patient's other noncompliance with medication regimen for other reason: Secondary | ICD-10-CM | POA: Diagnosis not present

## 2023-08-15 DIAGNOSIS — F0150 Vascular dementia without behavioral disturbance: Secondary | ICD-10-CM | POA: Diagnosis not present

## 2023-08-15 DIAGNOSIS — R4701 Aphasia: Secondary | ICD-10-CM | POA: Diagnosis not present

## 2023-08-15 DIAGNOSIS — T426X6A Underdosing of other antiepileptic and sedative-hypnotic drugs, initial encounter: Secondary | ICD-10-CM | POA: Diagnosis not present

## 2023-08-15 DIAGNOSIS — F419 Anxiety disorder, unspecified: Secondary | ICD-10-CM | POA: Diagnosis present

## 2023-08-15 DIAGNOSIS — Z681 Body mass index (BMI) 19 or less, adult: Secondary | ICD-10-CM | POA: Diagnosis not present

## 2023-08-15 DIAGNOSIS — Z8616 Personal history of COVID-19: Secondary | ICD-10-CM | POA: Diagnosis not present

## 2023-08-15 DIAGNOSIS — E870 Hyperosmolality and hypernatremia: Secondary | ICD-10-CM | POA: Diagnosis present

## 2023-08-15 DIAGNOSIS — Z7982 Long term (current) use of aspirin: Secondary | ICD-10-CM

## 2023-08-15 DIAGNOSIS — Z4682 Encounter for fitting and adjustment of non-vascular catheter: Secondary | ICD-10-CM | POA: Diagnosis not present

## 2023-08-15 DIAGNOSIS — R531 Weakness: Secondary | ICD-10-CM | POA: Diagnosis not present

## 2023-08-15 DIAGNOSIS — I771 Stricture of artery: Secondary | ICD-10-CM | POA: Diagnosis not present

## 2023-08-15 DIAGNOSIS — G9340 Encephalopathy, unspecified: Secondary | ICD-10-CM | POA: Diagnosis not present

## 2023-08-15 DIAGNOSIS — E785 Hyperlipidemia, unspecified: Secondary | ICD-10-CM | POA: Diagnosis present

## 2023-08-15 DIAGNOSIS — R739 Hyperglycemia, unspecified: Secondary | ICD-10-CM | POA: Diagnosis present

## 2023-08-15 DIAGNOSIS — R29818 Other symptoms and signs involving the nervous system: Secondary | ICD-10-CM | POA: Diagnosis not present

## 2023-08-15 DIAGNOSIS — G4089 Other seizures: Secondary | ICD-10-CM | POA: Diagnosis not present

## 2023-08-15 DIAGNOSIS — G9389 Other specified disorders of brain: Secondary | ICD-10-CM | POA: Diagnosis not present

## 2023-08-15 DIAGNOSIS — Z820 Family history of epilepsy and other diseases of the nervous system: Secondary | ICD-10-CM | POA: Diagnosis not present

## 2023-08-15 DIAGNOSIS — L8992 Pressure ulcer of unspecified site, stage 2: Secondary | ICD-10-CM | POA: Diagnosis not present

## 2023-08-15 DIAGNOSIS — I10 Essential (primary) hypertension: Secondary | ICD-10-CM | POA: Diagnosis not present

## 2023-08-15 DIAGNOSIS — R2689 Other abnormalities of gait and mobility: Secondary | ICD-10-CM | POA: Diagnosis not present

## 2023-08-15 DIAGNOSIS — F32A Depression, unspecified: Secondary | ICD-10-CM | POA: Diagnosis not present

## 2023-08-15 DIAGNOSIS — Z91013 Allergy to seafood: Secondary | ICD-10-CM | POA: Diagnosis not present

## 2023-08-15 DIAGNOSIS — R0989 Other specified symptoms and signs involving the circulatory and respiratory systems: Secondary | ICD-10-CM | POA: Diagnosis not present

## 2023-08-15 DIAGNOSIS — Z91041 Radiographic dye allergy status: Secondary | ICD-10-CM

## 2023-08-15 DIAGNOSIS — R401 Stupor: Secondary | ICD-10-CM

## 2023-08-15 DIAGNOSIS — R278 Other lack of coordination: Secondary | ICD-10-CM | POA: Diagnosis not present

## 2023-08-15 DIAGNOSIS — G40909 Epilepsy, unspecified, not intractable, without status epilepticus: Secondary | ICD-10-CM | POA: Diagnosis not present

## 2023-08-15 DIAGNOSIS — I69351 Hemiplegia and hemiparesis following cerebral infarction affecting right dominant side: Secondary | ICD-10-CM | POA: Diagnosis not present

## 2023-08-15 DIAGNOSIS — R9089 Other abnormal findings on diagnostic imaging of central nervous system: Secondary | ICD-10-CM | POA: Diagnosis not present

## 2023-08-15 DIAGNOSIS — M810 Age-related osteoporosis without current pathological fracture: Secondary | ICD-10-CM | POA: Diagnosis present

## 2023-08-15 DIAGNOSIS — G40901 Epilepsy, unspecified, not intractable, with status epilepticus: Principal | ICD-10-CM | POA: Insufficient documentation

## 2023-08-15 DIAGNOSIS — Z881 Allergy status to other antibiotic agents status: Secondary | ICD-10-CM

## 2023-08-15 DIAGNOSIS — R41841 Cognitive communication deficit: Secondary | ICD-10-CM | POA: Diagnosis not present

## 2023-08-15 DIAGNOSIS — Z8249 Family history of ischemic heart disease and other diseases of the circulatory system: Secondary | ICD-10-CM

## 2023-08-15 DIAGNOSIS — Z8269 Family history of other diseases of the musculoskeletal system and connective tissue: Secondary | ICD-10-CM | POA: Diagnosis not present

## 2023-08-15 DIAGNOSIS — R569 Unspecified convulsions: Secondary | ICD-10-CM

## 2023-08-15 DIAGNOSIS — R509 Fever, unspecified: Secondary | ICD-10-CM | POA: Insufficient documentation

## 2023-08-15 DIAGNOSIS — E440 Moderate protein-calorie malnutrition: Secondary | ICD-10-CM | POA: Diagnosis not present

## 2023-08-15 DIAGNOSIS — Z79899 Other long term (current) drug therapy: Secondary | ICD-10-CM

## 2023-08-15 DIAGNOSIS — Z95818 Presence of other cardiac implants and grafts: Secondary | ICD-10-CM | POA: Diagnosis not present

## 2023-08-15 DIAGNOSIS — Q438 Other specified congenital malformations of intestine: Secondary | ICD-10-CM | POA: Diagnosis not present

## 2023-08-15 DIAGNOSIS — J9601 Acute respiratory failure with hypoxia: Secondary | ICD-10-CM | POA: Diagnosis not present

## 2023-08-15 DIAGNOSIS — L8990 Pressure ulcer of unspecified site, unspecified stage: Secondary | ICD-10-CM | POA: Insufficient documentation

## 2023-08-15 DIAGNOSIS — Z807 Family history of other malignant neoplasms of lymphoid, hematopoietic and related tissues: Secondary | ICD-10-CM

## 2023-08-15 DIAGNOSIS — M6281 Muscle weakness (generalized): Secondary | ICD-10-CM | POA: Diagnosis not present

## 2023-08-15 DIAGNOSIS — I639 Cerebral infarction, unspecified: Secondary | ICD-10-CM | POA: Diagnosis not present

## 2023-08-15 DIAGNOSIS — R9082 White matter disease, unspecified: Secondary | ICD-10-CM | POA: Diagnosis not present

## 2023-08-15 LAB — I-STAT ARTERIAL BLOOD GAS, ED
Acid-Base Excess: 3 mmol/L — ABNORMAL HIGH (ref 0.0–2.0)
Bicarbonate: 27.4 mmol/L (ref 20.0–28.0)
Calcium, Ion: 1.19 mmol/L (ref 1.15–1.40)
HCT: 37 % (ref 36.0–46.0)
Hemoglobin: 12.6 g/dL (ref 12.0–15.0)
O2 Saturation: 100 %
Potassium: 4.1 mmol/L (ref 3.5–5.1)
Sodium: 136 mmol/L (ref 135–145)
TCO2: 29 mmol/L (ref 22–32)
pCO2 arterial: 41.3 mmHg (ref 32–48)
pH, Arterial: 7.43 (ref 7.35–7.45)
pO2, Arterial: 360 mmHg — ABNORMAL HIGH (ref 83–108)

## 2023-08-15 LAB — CBC
HCT: 41 % (ref 36.0–46.0)
HCT: 42.8 % (ref 36.0–46.0)
Hemoglobin: 13.3 g/dL (ref 12.0–15.0)
Hemoglobin: 13.8 g/dL (ref 12.0–15.0)
MCH: 29.6 pg (ref 26.0–34.0)
MCH: 29.6 pg (ref 26.0–34.0)
MCHC: 32.4 g/dL (ref 30.0–36.0)
MCHC: 32.2 g/dL (ref 30.0–36.0)
MCV: 91.3 fL (ref 80.0–100.0)
MCV: 91.8 fL (ref 80.0–100.0)
Platelets: 212 K/uL (ref 150–400)
Platelets: 191 K/uL (ref 150–400)
RBC: 4.49 MIL/uL (ref 3.87–5.11)
RBC: 4.66 MIL/uL (ref 3.87–5.11)
RDW: 14.5 % (ref 11.5–15.5)
RDW: 14.4 % (ref 11.5–15.5)
WBC: 7.1 K/uL (ref 4.0–10.5)
WBC: 5.8 K/uL (ref 4.0–10.5)
nRBC: 0 % (ref 0.0–0.2)
nRBC: 0 % (ref 0.0–0.2)

## 2023-08-15 LAB — I-STAT CHEM 8, ED
BUN: 19 mg/dL (ref 8–23)
Calcium, Ion: 1.17 mmol/L (ref 1.15–1.40)
Chloride: 102 mmol/L (ref 98–111)
Creatinine, Ser: 0.9 mg/dL (ref 0.44–1.00)
Glucose, Bld: 134 mg/dL — ABNORMAL HIGH (ref 70–99)
HCT: 41 % (ref 36.0–46.0)
Hemoglobin: 13.9 g/dL (ref 12.0–15.0)
Potassium: 4 mmol/L (ref 3.5–5.1)
Sodium: 140 mmol/L (ref 135–145)
TCO2: 25 mmol/L (ref 22–32)

## 2023-08-15 LAB — COMPREHENSIVE METABOLIC PANEL WITH GFR
ALT: 13 U/L (ref 0–44)
AST: 23 U/L (ref 15–41)
Albumin: 3.7 g/dL (ref 3.5–5.0)
Alkaline Phosphatase: 53 U/L (ref 38–126)
Anion gap: 13 (ref 5–15)
BUN: 16 mg/dL (ref 8–23)
CO2: 21 mmol/L — ABNORMAL LOW (ref 22–32)
Calcium: 9.4 mg/dL (ref 8.9–10.3)
Chloride: 103 mmol/L (ref 98–111)
Creatinine, Ser: 0.91 mg/dL (ref 0.44–1.00)
GFR, Estimated: >60 mL/min (ref >60)
Glucose, Bld: 133 mg/dL — ABNORMAL HIGH (ref 70–99)
Potassium: 4 mmol/L (ref 3.5–5.1)
Sodium: 137 mmol/L (ref 135–145)
Total Bilirubin: 0.4 mg/dL (ref 0.0–1.2)
Total Protein: 7.8 g/dL (ref 6.5–8.1)

## 2023-08-15 LAB — DIFFERENTIAL
Abs Immature Granulocytes: 0.03 K/uL (ref 0.00–0.07)
Basophils Absolute: 0 K/uL (ref 0.0–0.1)
Basophils Relative: 1 %
Eosinophils Absolute: 0.1 K/uL (ref 0.0–0.5)
Eosinophils Relative: 1 %
Immature Granulocytes: 0 %
Lymphocytes Relative: 23 %
Lymphs Abs: 1.7 K/uL (ref 0.7–4.0)
Monocytes Absolute: 0.7 K/uL (ref 0.1–1.0)
Monocytes Relative: 10 %
Neutro Abs: 4.6 K/uL (ref 1.7–7.7)
Neutrophils Relative %: 65 %

## 2023-08-15 LAB — URINALYSIS, ROUTINE W REFLEX MICROSCOPIC
Bilirubin Urine: NEGATIVE
Glucose, UA: NEGATIVE mg/dL
Hgb urine dipstick: NEGATIVE
Ketones, ur: NEGATIVE mg/dL
Leukocytes,Ua: NEGATIVE
Nitrite: NEGATIVE
Protein, ur: NEGATIVE mg/dL
Specific Gravity, Urine: 1.012 (ref 1.005–1.030)
pH: 6 (ref 5.0–8.0)

## 2023-08-15 LAB — RAPID URINE DRUG SCREEN, HOSP PERFORMED
Amphetamines: NOT DETECTED
Barbiturates: NOT DETECTED
Benzodiazepines: POSITIVE — AB
Cocaine: NOT DETECTED
Opiates: NOT DETECTED
Tetrahydrocannabinol: NOT DETECTED

## 2023-08-15 LAB — GLUCOSE, CAPILLARY
Glucose-Capillary: 91 mg/dL (ref 70–99)
Glucose-Capillary: 87 mg/dL (ref 70–99)
Glucose-Capillary: 103 mg/dL — ABNORMAL HIGH (ref 70–99)
Glucose-Capillary: 111 mg/dL — ABNORMAL HIGH (ref 70–99)

## 2023-08-15 LAB — CBG MONITORING, ED: Glucose-Capillary: 142 mg/dL — ABNORMAL HIGH (ref 70–99)

## 2023-08-15 LAB — CK: Total CK: 79 U/L (ref 38–234)

## 2023-08-15 LAB — ETHANOL: Alcohol, Ethyl (B): <15 mg/dL (ref <15)

## 2023-08-15 LAB — LACTIC ACID, PLASMA: Lactic Acid, Venous: 1.7 mmol/L (ref 0.5–1.9)

## 2023-08-15 LAB — MAGNESIUM: Magnesium: 1.9 mg/dL (ref 1.7–2.4)

## 2023-08-15 LAB — VALPROIC ACID LEVEL: Valproic Acid Lvl: <10 ug/mL — ABNORMAL LOW (ref 50–100)

## 2023-08-15 LAB — MRSA NEXT GEN BY PCR, NASAL: MRSA by PCR Next Gen: NOT DETECTED

## 2023-08-15 MED ORDER — ROCURONIUM BROMIDE 10 MG/ML (PF) SYRINGE: 50.0000 mg | PREFILLED_SYRINGE | Freq: Once | INTRAVENOUS | Status: DC

## 2023-08-15 MED ORDER — FENTANYL CITRATE PF 50 MCG/ML IJ SOSY
100.0000 ug | PREFILLED_SYRINGE | Freq: Once | INTRAMUSCULAR | Status: AC
  Administered 2023-08-15: 100 ug via INTRAVENOUS
  Filled 2023-08-15: qty 2

## 2023-08-15 MED ORDER — POLYETHYLENE GLYCOL 3350 17 G PO PACK
17.0000 g | PACK | Freq: Every day | ORAL | Status: DC
  Administered 2023-08-15 – 2023-08-16 (×2): 17 g
  Filled 2023-08-15 (×2): qty 1

## 2023-08-15 MED ORDER — FENTANYL CITRATE PF 50 MCG/ML IJ SOSY
PREFILLED_SYRINGE | INTRAMUSCULAR | Status: AC
  Filled 2023-08-15: qty 2

## 2023-08-15 MED ORDER — LEVETIRACETAM (KEPPRA) 500 MG/5 ML ADULT IV PUSH
3000.0000 mg | Freq: Once | INTRAVENOUS | Status: AC
  Administered 2023-08-15: 3000 mg via INTRAVENOUS

## 2023-08-15 MED ORDER — PROPOFOL 1000 MG/100ML IV EMUL
0.0000 ug/kg/min | INTRAVENOUS | Status: DC
  Administered 2023-08-15: 10 ug/kg/min via INTRAVENOUS
  Administered 2023-08-15: 40 ug/kg/min via INTRAVENOUS
  Administered 2023-08-16: 30 ug/kg/min via INTRAVENOUS
  Filled 2023-08-15 (×2): qty 100

## 2023-08-15 MED ORDER — FENTANYL CITRATE PF 50 MCG/ML IJ SOSY
100.0000 ug | PREFILLED_SYRINGE | Freq: Once | INTRAMUSCULAR | Status: AC
  Administered 2023-08-15: 100 ug via INTRAVENOUS

## 2023-08-15 MED ORDER — DOCUSATE SODIUM 50 MG/5ML PO LIQD: 100.0000 mg | Freq: Two times a day (BID) | ORAL | Status: DC | PRN

## 2023-08-15 MED ORDER — ASPIRIN 81 MG PO CHEW
81.0000 mg | CHEWABLE_TABLET | Freq: Every day | ORAL | Status: DC
  Administered 2023-08-15 – 2023-08-17 (×3): 81 mg
  Filled 2023-08-15 (×3): qty 1

## 2023-08-15 MED ORDER — ADULT MULTIVITAMIN LIQUID CH
15.0000 mL | Freq: Every day | ORAL | Status: DC
  Filled 2023-08-15: qty 15

## 2023-08-15 MED ORDER — FAMOTIDINE 20 MG PO TABS: 20.0000 mg | ORAL_TABLET | Freq: Two times a day (BID) | ORAL | Status: DC

## 2023-08-15 MED ORDER — INSULIN ASPART 100 UNIT/ML IJ SOLN
0.0000 [IU] | INTRAMUSCULAR | Status: DC
  Administered 2023-08-17 (×2): 1 [IU] via SUBCUTANEOUS
  Administered 2023-08-17: 2 [IU] via SUBCUTANEOUS

## 2023-08-15 MED ORDER — LEVETIRACETAM (KEPPRA) 500 MG/5 ML ADULT IV PUSH
1000.0000 mg | Freq: Two times a day (BID) | INTRAVENOUS | Status: DC
  Administered 2023-08-15 – 2023-08-18 (×6): 1000 mg via INTRAVENOUS
  Filled 2023-08-15 (×6): qty 10

## 2023-08-15 MED ORDER — KETAMINE HCL 50 MG/5ML IJ SOSY
PREFILLED_SYRINGE | INTRAMUSCULAR | Status: AC
  Filled 2023-08-15: qty 5

## 2023-08-15 MED ORDER — FENTANYL CITRATE PF 50 MCG/ML IJ SOSY: 50.0000 ug | PREFILLED_SYRINGE | INTRAMUSCULAR | Status: DC | PRN

## 2023-08-15 MED ORDER — KETAMINE HCL 10 MG/ML IJ SOLN
INTRAMUSCULAR | Status: AC | PRN
  Administered 2023-08-15: 50 mg via INTRAVENOUS

## 2023-08-15 MED ORDER — ORAL CARE MOUTH RINSE
15.0000 mL | OROMUCOSAL | Status: DC | PRN
Start: 2023-08-15 — End: 2023-08-17

## 2023-08-15 MED ORDER — FAMOTIDINE 20 MG PO TABS: 20.0000 mg | ORAL_TABLET | Freq: Every day | ORAL | Status: DC

## 2023-08-15 MED ORDER — MIDAZOLAM HCL 2 MG/2ML IJ SOLN
4.0000 mg | Freq: Once | INTRAMUSCULAR | Status: AC
  Administered 2023-08-15: 4 mg via INTRAVENOUS
  Filled 2023-08-15: qty 4

## 2023-08-15 MED ORDER — FENTANYL CITRATE PF 50 MCG/ML IJ SOSY
50.0000 ug | PREFILLED_SYRINGE | INTRAMUSCULAR | Status: DC | PRN
  Administered 2023-08-15: 100 ug via INTRAVENOUS
  Administered 2023-08-16: 50 ug via INTRAVENOUS
  Administered 2023-08-16: 100 ug via INTRAVENOUS
  Administered 2023-08-16: 50 ug via INTRAVENOUS
  Administered 2023-08-16: 100 ug via INTRAVENOUS
  Administered 2023-08-16 (×3): 50 ug via INTRAVENOUS
  Administered 2023-08-17: 100 ug via INTRAVENOUS
  Filled 2023-08-15 (×2): qty 2
  Filled 2023-08-15: qty 1
  Filled 2023-08-15 (×3): qty 2
  Filled 2023-08-15: qty 1
  Filled 2023-08-15: qty 2
  Filled 2023-08-15: qty 1

## 2023-08-15 MED ORDER — ROCURONIUM BROMIDE 10 MG/ML (PF) SYRINGE
PREFILLED_SYRINGE | INTRAVENOUS | Status: AC | PRN
  Administered 2023-08-15: 50 mg via INTRAVENOUS

## 2023-08-15 MED ORDER — POLYETHYLENE GLYCOL 3350 17 G PO PACK: 17.0000 g | PACK | Freq: Every day | ORAL | Status: DC | PRN

## 2023-08-15 MED ORDER — CHLORHEXIDINE GLUCONATE CLOTH 2 % EX PADS
6.0000 | MEDICATED_PAD | Freq: Every day | CUTANEOUS | Status: DC
  Administered 2023-08-15 – 2023-08-26 (×12): 6 via TOPICAL

## 2023-08-15 MED ORDER — KETAMINE HCL 50 MG/5ML IJ SOSY: 50.0000 mg | PREFILLED_SYRINGE | Freq: Once | INTRAMUSCULAR | Status: DC

## 2023-08-15 MED ORDER — ORAL CARE MOUTH RINSE
15.0000 mL | OROMUCOSAL | Status: DC
  Administered 2023-08-15 – 2023-08-17 (×26): 15 mL via OROMUCOSAL

## 2023-08-15 MED ORDER — ADULT MULTIVITAMIN W/MINERALS CH
1.0000 | ORAL_TABLET | Freq: Every day | ORAL | Status: DC
  Administered 2023-08-16 – 2023-08-17 (×2): 1
  Filled 2023-08-15 (×2): qty 1

## 2023-08-15 MED ORDER — MAGNESIUM SULFATE 2 GM/50ML IV SOLN
2.0000 g | Freq: Once | INTRAVENOUS | Status: AC
  Administered 2023-08-15: 2 g via INTRAVENOUS
  Filled 2023-08-15: qty 50

## 2023-08-15 MED ORDER — HEPARIN SODIUM (PORCINE) 5000 UNIT/ML IJ SOLN
5000.0000 [IU] | Freq: Three times a day (TID) | INTRAMUSCULAR | Status: DC
  Administered 2023-08-15 – 2023-08-16 (×3): 5000 [IU] via SUBCUTANEOUS
  Filled 2023-08-15 (×3): qty 1

## 2023-08-15 MED ORDER — PROPOFOL 1000 MG/100ML IV EMUL
INTRAVENOUS | Status: AC
  Filled 2023-08-15: qty 100

## 2023-08-15 MED ORDER — DOCUSATE SODIUM 50 MG/5ML PO LIQD
100.0000 mg | Freq: Two times a day (BID) | ORAL | Status: DC
  Administered 2023-08-15 – 2023-08-16 (×4): 100 mg
  Filled 2023-08-15 (×4): qty 10

## 2023-08-15 NOTE — Progress Notes (Signed)
 Pharmacy Electrolyte Replacement  Recent Labs:  Recent Labs    08/15/23 1020 08/15/23 1025 08/15/23 1219  K 4.0 4.0 4.1  MG 1.9  --   --   CREATININE 0.91 0.90  --     Low Critical Values (K </= 2.5, Phos </= 1, Mg </= 1) Present: None  Plan: Give magnesium  sulfate 2g IV once. Recheck levels with AM labs.   Vermell Mccallum, PharmD CCM Pharmacy Resident

## 2023-08-15 NOTE — Code Documentation (Signed)
 Time out before intubation done now by EDP Jakie.

## 2023-08-15 NOTE — Consult Note (Addendum)
 NEUROLOGY CONSULT NOTE   Date of service: August 15, 2023 Patient Name: Jacqueline Bonilla MRN:  [id number] DOB:  1955/12/22 Chief Complaint: Abnormal speech and right facial droop at home followed by seizure with EMS en route Requesting Provider: No att. providers found  History of Present Illness  Jacqueline Bonilla is a 68 y.o. 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  Patient was last in her usual state of health at 9:15 this morning and at 9:20, patient's husband noticed that the patient's right face became weak and her speech was altered.  EMS activated a code stroke and en route to the ED, patient had a witnessed desaturation event with a right sided gaze and a decreased respiratory rate followed by 3 minutes of seizure activity on the right.  EMS gave 2.5 mg IV Versed .  On arrival, patient was receiving assisted respirations via bag and had a forced rightward head turn with ongoing right thumb and forearm rhythmic twitching.   Was recently started on Depakote  ER 500 mg po every day at discharge 07/02/2023 where she was evaluated for TIA versus seizure like events. Uncertain if she has been compliant at home.  First concern for seizure history in December 2022 following a left MCA CVA s/p TNK and thrombectomy with subsequent SAH.  She was placed on Keppra  at that time with EEG evidence of cortical dysfunction and epileptogenicity from the left frontal parietal region without evidence of seizures.  Patient went to rehab and at follow-up  appointment in February 2023, patient was no longer taking Keppra  and reported preference to stay off of Keppra  unless further concerning seizure-like events.  ROS  Unable to ascertain due to consistent right-sided seizure activity with brief periods of right gaze resolution to midline gaze and unresponsiveness without return to normal mentation between episodes.   Past History   Past Medical History:  Diagnosis Date   Anemia    Anxiety    Depression    Dysmenorrhea    Endometriosis    Fibroid    Hypertension    Implantable loop recorder present 2021   Microhematuria    negative workup   Osteoporosis    Tachycardia    Thrombocytopenia (HCC)    Past Surgical History:  Procedure Laterality Date   BREAST BIOPSY     BUBBLE STUDY  12/08/2020   Procedure: BUBBLE STUDY;  Surgeon: Delford Maude BROCKS, MD;  Location: [facility] ENDOSCOPY;  Service: Cardiovascular;;   CESAREAN SECTION     hysteroscopic resection     implantable loop recorder implant  10/21/2019   Medtronic Reveal Linq model LNQ 22 ([place] [id number]) implantable loop recorder   IR CT HEAD LTD  12/01/2020   IR PERCUTANEOUS ART THROMBECTOMY/INFUSION INTRACRANIAL INC DIAG ANGIO  12/01/2020   IR US  GUIDE VASC ACCESS RIGHT  12/01/2020   ORIF HUMERUS FRACTURE Left 07/31/2021   Procedure: OPEN REDUCTION INTERNAL FIXATION (ORIF) DISTAL HUMERUS FRACTURE;  Surgeon: Kendal Franky SHAUNNA, MD;  Location: [facility] OR;  Service: Orthopedics;  Laterality: Left;   RADIOLOGY WITH ANESTHESIA N/A 12/01/2020   Procedure: IR WITH ANESTHESIA -  CODE STROKE;  Surgeon: Radiologist, Medication, MD;  Location: [facility] OR;  Service: Radiology;  Laterality: N/A;   TEE WITHOUT CARDIOVERSION N/A 12/08/2020   Procedure: TRANSESOPHAGEAL ECHOCARDIOGRAM (TEE);  Surgeon: Delford Maude BROCKS, MD;  Location: [facility] ENDOSCOPY;  Service: Cardiovascular;  Laterality: N/A;   Family History: Family History  Problem Relation Age of Onset   Cancer Father        non hodgkin lymphoma & skin   Heart attack  Maternal Grandfather    Dementia Mother    Polymyositis Sister    Social History  reports that she has never smoked. She has never used smokeless tobacco. She reports that she does not drink alcohol and does not use drugs.  Allergies  Allergen Reactions   Iohexol  Other (See Comments)     Code: HIVES, Desc: HIVES S/P IVP MANY YRS AGO    Plavix  [Clopidogrel ] Diarrhea   Quinolones Hives    Cipro, Levaquin, Avelox all cause hives   Shellfish Allergy Itching    Itching under the chin. Described by patient but not seen by husband.   Medications   Current Facility-Administered Medications:    levETIRAcetam  (KEPPRA ) undiluted injection 3,000 mg, 3,000 mg, Intravenous, Once, Jacqueline Tenbrink, MD   midazolam  (VERSED ) injection 4 mg, 4 mg, Intravenous, Once, Edlin Ford, MD  Current Outpatient Medications:    Amino Acids  Complex TABS, Take 1 tablet by mouth daily., Disp: , Rfl:    aspirin  81 MG chewable tablet, Chew 1 tablet (81 mg total) by mouth daily., Disp: , Rfl:    atorvastatin  (LIPITOR) 40 MG tablet, Take 1 tablet (40 mg total) by mouth daily., Disp: 90 tablet, Rfl: 1   CINNAMON  PO, Take 1 capsule by mouth daily., Disp: , Rfl:    Coenzyme Q10 (CO Q 10 PO), Take 1 capsule by mouth daily., Disp: , Rfl:    divalproex  (DEPAKOTE  ER) 500 MG 24 hr tablet, Take 1 tablet (500 mg total) by mouth daily., Disp: 90 tablet, Rfl: 3   losartan  (COZAAR ) 25 MG tablet, Take 0.5 tablets (12.5 mg total) by mouth daily., Disp: 30 tablet, Rfl: 0   MAGNESIUM  PO, Take 1 tablet by mouth at bedtime., Disp: , Rfl:    Multiple Vitamin (MULTIVITAMIN WITH MINERALS) TABS tablet, Take 1 tablet by mouth daily., Disp: , Rfl:    Multiple Vitamins-Minerals (ZINC  PO), Take 1 tablet by mouth daily., Disp: , Rfl:    Study - LIBREXIA-STROKE - milvexian 25 mg or placebo tablet (PI-Sethi), Take 1 tablet by mouth 2 (two) times daily., Disp: 280 tablet, Rfl: 0  Vitals   There were no vitals filed for this visit.  There is no  height or weight on file to calculate BMI.  Physical Exam   Constitutional: Appears well-developed and well-nourished.  Eyes: Bilateral lacrimation HENT: No OP obstruction. Drooling noted.  Head: Normocephalic.  Respiratory: Being bagged on arrival to ED Skin: Some flushing to facial skin and medial aspects of upper arms bilaterally  Neurologic Examination   Mental Status: Patient is not arousable. She does not open eyes to stimulation or spontaneously. She does not follow commands.  She has consistent right forearm and thumb rhythmic twitching.  She does not verbalize throughout assessment.   Cranial Nerves: II: PERRL 3 mm and brisk  III,IV, VI: Forced right gaze deviation with brief periods of midline gaze without return to baseline mental status V: Does not react to touch on bilateral face VII: Left mouth and NLF weakness appreciated at rest VIII: Unable to  assess hearing due to patient condition X: Does not phonate, gag intact with oral suctioning XI: Head with forced rightward turn XII: Does not protrude tongue to command Motor/Sensory: Bilateral upper extremities withdraw to noxious stimuli left > right Bilateral lower extremities with a more robust withdraw to noxious stimuli compared to the upper extremities. Left lower extremity > right lower extremity withdraw with minimal antigravity movement.  Right forearm and thumb rhythmic twitching.  Plantars: Toes are downgoing bilaterally.  Cerebellar: Does not perform  NIHSS components Score: Comment  1a Level of Conscious 0[]  1[]  2[x]  3[]      1b LOC Questions 0[]  1[]  2[x]       1c LOC Commands 0[]  1[]  2[x]       2 Best Gaze 0[]  1[]  2[x]       3 Visual 0[x]  1[]  2[]  3[]      4 Facial Palsy 0[]  1[]  2[x]  3[]      5a Motor Arm - left 0[]  1[]  2[]  3[x]  4[]  UN[]    5b Motor Arm - Right 0[]  1[]  2[]  3[x]  4[]  UN[]    6a Motor Leg - Left 0[]  1[]  2[x]  3[]  4[]  UN[]    6b Motor Leg - Right 0[]  1[]  2[]  3[x]  4[]  UN[]    7 Limb Ataxia 0[x]  1[]   2[]  UN[]      8 Sensory 0[x]  1[]  2[]  UN[]      9 Best Language 0[]  1[]  2[]  3[x]      10 Dysarthria 0[]  1[]  2[x]  UN[]      11 Extinct. and Inattention 0[x]  1[]  2[]       TOTAL:  26    Labs/Imaging/Neurodiagnostic studies   CBC: No results for input(s): WBC, NEUTROABS, HGB, HCT, MCV, PLT in the last 168 hours. Basic Metabolic Panel:  Lab Results  Component Value Date   NA 136 07/02/2023   K 3.6 07/02/2023   CO2 24 07/02/2023   GLUCOSE 101 (H) 07/02/2023   BUN 14 07/02/2023   CREATININE 0.67 07/02/2023   CALCIUM  9.3 07/02/2023   GFRNONAA >60 07/02/2023   GFRAA 78 07/20/2019   Lipid Panel:  Lab Results  Component Value Date   LDLCALC 99 07/01/2023   HgbA1c:  Lab Results  Component Value Date   HGBA1C 5.5 07/01/2023   Urine Drug Screen:     Component Value Date/Time   LABOPIA NONE DETECTED 06/30/2023 2141   COCAINSCRNUR NONE DETECTED 06/30/2023 2141   LABBENZ NONE DETECTED 06/30/2023 2141   AMPHETMU NONE DETECTED 06/30/2023 2141   THCU NONE DETECTED 06/30/2023 2141   LABBARB NONE DETECTED 06/30/2023 2141    Alcohol Level     Component Value Date/Time   [facility] <15 06/30/2023 1549   INR  Lab Results  Component Value Date   INR 1.1 06/30/2023   APTT  Lab Results  Component Value Date   APTT 30 06/30/2023   Prior MRI from June: Diffuse bilateral cortical atrophy, worse in the parietal regions. Moderate to severe chronic microvascular ischemic changes. Chronic hemosiderin deposition cortically in left parietal and posterior left frontal region. Many small remote infarcts.  ASSESSMENT  KIERRE DEINES is a 68 y.o. female with PMHx of HTN, HLD, vascular dementia, left MCA CVA with right upper extremity hemiplegia, recent evaluation in June 2025 for seizure versus TIA discharged on Depakote  XR 500 mg daily with unclear compliance, seizure history first noted in December of 2022 following left MCA CVA with TNK and thrombectomy with subsequent SAH, previously on  Keppra  with EEG findings concerning for cortical dysfunction and epileptogenicity from the left frontal parietal region  who presented to the ED today with initial symptoms of right facial weakness and speech disturbance with a right forced gaze, oxygen  desaturation, and seizure activity en route to the ED.  - Exam reveals patient with rightward head turn, right forced gaze with very brief periods of midline gaze, ongoing right thumb and forearm rhythmic twitching consistent with seizure activity.  The patient does not return to baseline mentation throughout exam. - Patient given 4 mg of Versed  IV and 3,000 mg loading dose of Keppra  prior to [facility]. With ongoing seizure activity and concern for airway protection, patient was taken back to ED for intubation prior to further antiepileptic drug administration. - Presentation is consistent with ongoing seizure. Will place on LTM and repeat MRI brain with and without contrast.   RECOMMENDATIONS  - STAT LTM EEG with MRI safe leads (ordered) - STAT Keppra  loading dose 3000 mg IV - Continue Keppra  at 1000 mg IV BID (ordered) - STAT CT head to rule out bleed - Have ordered Versed  4 mg IV STAT for recurrent right sided hand twitching - Inpatient seizure precautions - VPA level (ordered) - MRI brain with and without contrast - Discontinue home VPA level on admission, will manage further seizure-like activity with Keppra   Addendum: LTM EEG reviewed from 12:45 PM to 3:00 PM. Initial prominent muscle artifact in all leads, followed by beta activity, consistent with benzodiazepine effect. Later with diffuse, low amplitude slowing.   Addendum (10:13 PM): Patient re-examined. Intubated on propofol  at a rate of 25. Opens eyes briefly to voice. Moves BLE spontaneously. Not following commands. No jerking or twitching of right hand or wrist noted. No eye or head deviation appreciated.  I personally spent a total of 50 minutes in the care of this critically ill patient  today including getting/reviewing separately obtained history, performing a medically appropriate exam/evaluation, placing orders, referring and communicating with other health care professionals, documenting clinical information in the EHR, independently interpreting results, communicating results, and coordinating care.  ______________________________________________________________________  Bonney SHARK, Roda Lauture, MD [facility]

## 2023-08-15 NOTE — ED Provider Notes (Addendum)
  EMERGENCY DEPARTMENT AT [facility] Provider Note   CSN: [id number] Arrival date & time: 08/15/23  1015     Patient presents with: Code Stroke   Jacqueline Bonilla is a 68 y.o. female.   68 year old female with a history of left M2 stroke and seizure-like activity presents emergency department with altered mental status and weakness.  9:15 AM patient was reported well by EMS.  Husband reported that she started having weakness on the right side of her body shortly there afterwards.  She was confused and and route had a generalized tonic-clonic seizure.  Was given 2.5 mg of Versed  and the seizure was aborted.  Unfortunately is being bagged at this point in time.  Patient unable to give additional history       Prior to Admission medications   Medication Sig Start Date End Date Taking? Authorizing Provider  aspirin  81 MG chewable tablet Chew 1 tablet (81 mg total) by mouth daily. 04/02/23  Yes Shafer, Jorene, NP  atorvastatin  (LIPITOR) 40 MG tablet Take 1 tablet (40 mg total) by mouth daily. 05/29/23  Yes SagardiaEmil Schanz, MD  CINNAMON  PO Take 1 capsule by mouth daily.   Yes [provider]  Coenzyme Q10 (CO Q 10 PO) Take 1 capsule by mouth daily.   Yes [provider]  divalproex  (DEPAKOTE  ER) 500 MG 24 hr tablet Take 1 tablet (500 mg total) by mouth daily. 07/16/23 07/15/24 Yes Sagardia, Miguel Jose, MD  Ferrous Sulfate (IRON PO) Take 1 tablet by mouth daily.   Yes [provider]  losartan  (COZAAR ) 25 MG tablet Take 0.5 tablets (12.5 mg total) by mouth daily. 04/08/23  Yes Angiulli, Toribio PARAS, PA-C  MAGNESIUM  PO Take 1 tablet by mouth at bedtime.   Yes [provider]  Multiple Vitamin (MULTIVITAMIN WITH MINERALS) TABS tablet Take 1 tablet by mouth daily. Liposomal multivitamin/minerals   Yes [provider]  OVER THE COUNTER MEDICATION Take 1 tablet by mouth daily. OTC vitamin C + quercetin   Yes [provider]   Study - LIBREXIA-STROKE - milvexian 25 mg or placebo tablet (PI-Sethi) Take 1 tablet by mouth 2 (two) times daily. 06/26/23  Yes Rosemarie Eather RAMAN, MD    Allergies: Iodinated contrast media, Iohexol , Plavix  [clopidogrel ], Quinolones, and Shellfish allergy    Review of Systems  Updated Vital Signs BP (!) 153/81   Pulse (!) 128   Temp 100.1 F (37.8 C) (Oral)   Resp 14   Ht 5' 4 (1.626 m)   Wt 54.4 kg   LMP 01/02/2003   SpO2 98%   BMI 20.59 kg/m   Physical Exam Vitals and nursing note reviewed.  Constitutional:      General: She is in acute distress.     Appearance: She is well-developed. She is ill-appearing.     Comments: Patient being bagged.    HENT:     Head: Normocephalic and atraumatic.     Right Ear: External ear normal.     Left Ear: External ear normal.     Nose: Nose normal.  Eyes:     Conjunctiva/sclera: Conjunctivae normal.     Pupils: Pupils are equal, round, and reactive to light.     Comments: Pupils 4 mm bilaterally  Cardiovascular:     Rate and Rhythm: Normal rate and regular rhythm.     Heart sounds: No murmur heard. Pulmonary:     Effort: Pulmonary effort is normal. No respiratory distress.  Breath sounds: Normal breath sounds.     Comments: Patient being bagged Musculoskeletal:     Cervical back: Normal range of motion and neck supple.     Right lower leg: No edema.     Left lower leg: No edema.  Skin:    General: Skin is warm and dry.  Neurological:     Comments: Does not open eyes to voice.  Right gaze deviation.  Withdrawals bilateral lower extremities and left upper extremity to pain.  Right upper extremity thumb twitching present.  Unable to cooperate with neuroexam otherwise.  Psychiatric:        Mood and Affect: Mood normal.     (all labs ordered are listed, but only abnormal results are displayed) Labs Reviewed  COMPREHENSIVE METABOLIC PANEL WITH GFR - Abnormal; Notable for the following components:      Result Value   CO2 21  (*)    Glucose, Bld 133 (*)    All other components within normal limits  RAPID URINE DRUG SCREEN, HOSP PERFORMED - Abnormal; Notable for the following components:   Benzodiazepines POSITIVE (*)    All other components within normal limits  VALPROIC ACID  LEVEL - Abnormal; Notable for the following components:   Valproic Acid  Lvl <10 (*)    All other components within normal limits  BASIC METABOLIC PANEL WITH GFR - Abnormal; Notable for the following components:   Sodium 133 (*)    CO2 21 (*)    Glucose, Bld 122 (*)    All other components within normal limits  CBC - Abnormal; Notable for the following components:   Hemoglobin 11.6 (*)    HCT 35.4 (*)    All other components within normal limits  GLUCOSE, CAPILLARY - Abnormal; Notable for the following components:   Glucose-Capillary 103 (*)    All other components within normal limits  GLUCOSE, CAPILLARY - Abnormal; Notable for the following components:   Glucose-Capillary 111 (*)    All other components within normal limits  GLUCOSE, CAPILLARY - Abnormal; Notable for the following components:   Glucose-Capillary 120 (*)    All other components within normal limits  GLUCOSE, CAPILLARY - Abnormal; Notable for the following components:   Glucose-Capillary 102 (*)    All other components within normal limits  GLUCOSE, CAPILLARY - Abnormal; Notable for the following components:   Glucose-Capillary 110 (*)    All other components within normal limits  CBC - Abnormal; Notable for the following components:   Hemoglobin 11.6 (*)    HCT 35.4 (*)    All other components within normal limits  RENAL FUNCTION PANEL - Abnormal; Notable for the following components:   Sodium 132 (*)    CO2 20 (*)    Glucose, Bld 173 (*)    Calcium  8.8 (*)    Albumin 3.1 (*)    All other components within normal limits  GLUCOSE, CAPILLARY - Abnormal; Notable for the following components:   Glucose-Capillary 141 (*)    All other components within normal  limits  GLUCOSE, CAPILLARY - Abnormal; Notable for the following components:   Glucose-Capillary 156 (*)    All other components within normal limits  GLUCOSE, CAPILLARY - Abnormal; Notable for the following components:   Glucose-Capillary 104 (*)    All other components within normal limits  CBG MONITORING, ED - Abnormal; Notable for the following components:   Glucose-Capillary 142 (*)    All other components within normal limits  I-STAT CHEM 8, ED - Abnormal; Notable  for the following components:   Glucose, Bld 134 (*)    All other components within normal limits  I-STAT ARTERIAL BLOOD GAS, ED - Abnormal; Notable for the following components:   pO2, Arterial 360 (*)    Acid-Base Excess 3.0 (*)    All other components within normal limits  MRSA NEXT GEN BY PCR, NASAL  CULTURE, BLOOD (ROUTINE X 2)  CULTURE, BLOOD (ROUTINE X 2)  ETHANOL  CBC  DIFFERENTIAL  URINALYSIS, ROUTINE W REFLEX MICROSCOPIC  MAGNESIUM   CBC  GLUCOSE, CAPILLARY  LACTIC ACID, PLASMA  CK  GLUCOSE, CAPILLARY  TRIGLYCERIDES  MAGNESIUM   PHOSPHORUS  AMMONIA  GLUCOSE, CAPILLARY  MAGNESIUM   PHOSPHORUS  TRIGLYCERIDES  GLUCOSE, CAPILLARY    EKG: EKG Interpretation Date/Time:  Thursday August 15 2023 10:25:24 EDT Ventricular Rate:  93 PR Interval:  164 QRS Duration:  94 QT Interval:  359 QTC Calculation: 447 R Axis:   -16  Text Interpretation: Sinus rhythm Borderline left axis deviation Abnormal R-wave progression, early transition Borderline repolarization abnormality Confirmed by Cleotilde Rogue ([id number]) on 08/16/2023 8:25:17 PM  Radiology: MR BRAIN WO CONTRAST Result Date: 08/16/2023 EXAM: MRI BRAIN WITHOUT CONTRAST 08/16/2023 03:27:34 PM TECHNIQUE: Multiplanar multisequence MRI of the head/brain was performed without the administration of intravenous contrast. COMPARISON: MRI of the head dated 06/30/2023 and CT of the head dated 08/15/2023. CLINICAL HISTORY: Stroke, follow up. FINDINGS: BRAIN AND  VENTRICLES: Moderate generalized cerebral volume loss. Extensive cerebral white matter disease. Chronic encephalomalacia changes within the left occipital lobe. Chronic small focal cortical infarcts within the frontal lobes bilaterally. Chronic lacunar infarcts present within the thalami and bilateral cerebellar hemispheres. No acute infarct. No intracranial hemorrhage. No mass. No midline shift. No hydrocephalus. The sella is unremarkable. Normal flow voids. ORBITS: No acute abnormality. SINUSES AND MASTOIDS: Mucosal disease within the sphenoid sinus. No acute abnormality. BONES AND SOFT TISSUES: Normal marrow signal. No acute soft tissue abnormality. VASCULATURE: Hemosiderin staining present within the left parietal lobe. IMPRESSION: 1. No acute findings. 2. Moderate generalized cerebral volume loss and extensive cerebral white matter disease. 3. Chronic encephalomalacia changes within the left occipital lobe. 4. Chronic small focal cortical infarcts within the frontal lobes bilaterally. 5. Chronic lacunar infarcts within the thalami and bilateral cerebellar hemispheres. 6. Hemosiderin staining within the left parietal lobe. Electronically signed by: evalene coho 08/16/2023 04:12 PM EDT RP Workstation: [id number]   Overnight EEG with video Result Date: 08/16/2023 [name], MD     08/16/2023  9:24 AM Patient Name: Jacqueline Bonilla MRN: [id number] Epilepsy Attending: Arlin KIDD Shelton Referring Physician/Provider: Merrianne Locus, MD Duration: 08/15/2023 1201 to 08/16/2023 0915 Patient history:  68yo F presented to the ED today with initial symptoms of right facial weakness and speech disturbance with a right forced gaze, oxygen  desaturation, and seizure activity en route to the ED. EEG to evaluate for seizure Level of alertness:  comatose/lethargic AEDs during EEG study: LEV, Propofol  Technical aspects: This EEG study was done with scalp electrodes positioned according to the 10-20 International system  of electrode placement. Electrical activity was reviewed with band pass filter of 1-70Hz , sensitivity of 7 uV/mm, display speed of 37mm/sec with a 60Hz  notched filter applied as appropriate. EEG data were recorded continuously and digitally stored.  Video monitoring was available and reviewed as appropriate. Description: At the beginning of the study, EEG showed lateralized periodic discharges with fluctuating frequency of 1 to 2.5 Hz with overlying rhythmicity lasting for 15 seconds alternating with 1 to 2 seconds of generalized EEG  attenuation.  No clinical signs were noted.  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  EEG was then disconnected on 08/15/2023 at 1206 as patient was transferred to a different room.  Subsequently when EEG was reconnected at 1241, EEG showed continuous generalized and lateralized left hemisphere 3 to 6 Hz theta slowing with overriding 13 to 15 Hz beta activity. Hyperventilation and photic stimulation were not performed.   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  EEG was disconnected on 08/15/2023 at 1206 as patient was being transferred.  Subsequently when EEG was reconnected at 1241, status epilepticus had resolved.  EEG was then suggestive of cortical dysfunction in left hemisphere likely secondary to underlying structural abnormality, postictal state.  Additionally there was moderate diffuse encephalopathy. Arlin MALVA Krebs   DG Chest Port 1 View Result Date: 08/16/2023 CLINICAL DATA:  Endotracheal tube placement EXAM: PORTABLE CHEST 1 VIEW COMPARISON:  08/15/2023 FINDINGS: Endotracheal tube seen 3.8 cm above the carina. Nasogastric tube extends into the upper abdomen beyond margin of the examination. Lungs are  clear. No pneumothorax or pleural effusion. Cardiac size within normal limits. Implanted loop recorder noted. Pulmonary vascularity is normal. No acute bone abnormality. IMPRESSION: 1. Support tubes in appropriate position. Electronically Signed   By: Dorethia Molt M.D.   On: 08/16/2023 05:42   DG Abdomen 1 View Result Date: 08/15/2023 CLINICAL DATA:  OG placement, post intubation EXAM: ABDOMEN - 1 VIEW COMPARISON:  December 02, 2020 FINDINGS: Esophagogastric tube in place terminating to the right of midline, likely in the stomach, displaced to the right by colonic gas. No gaseous distension of the small bowel. Diffuse gaseous distention of the redundant colon in the left upper quadrant. No pneumoperitoneum. No acute fracture or destructive lesion. The lung bases are clear. IMPRESSION: 1. Esophagogastric tube terminates to the right of midline, likely in the stomach, which is displaced to the right by colonic gas. 2. Diffuse gaseous distension of the redundant colon in the left upper quadrant. Otherwise, no small-bowel obstruction visualized. Electronically Signed   By: Rogelia Myers M.D.   On: 08/15/2023 11:33   DG Chest Port 1 View Result Date: 08/15/2023 CLINICAL DATA:  post intubation EXAM: PORTABLE CHEST - 1 VIEW COMPARISON:  December 02, 2020 FINDINGS: Endotracheal tube is well positioned terminating in the mid trachea. Esophagogastric tube courses below the diaphragm with the distal tip not included in the field of view. No focal airspace consolidation, pleural effusion, or pneumothorax. Likely skin fold artifact along the lateral upper right lung zone. No cardiomegaly. Cardiac loop recorder device. Tortuous aorta. No acute fracture or destructive lesions. Multilevel thoracic osteophytosis. IMPRESSION: 1. Low lung volumes. Otherwise, no acute cardiopulmonary abnormality. 2. Well-positioned endotracheal tube terminating in the mid trachea. Electronically Signed   By: Rogelia Myers M.D.   On:  08/15/2023 11:30   CT HEAD CODE STROKE WO CONTRAST Result Date: 08/15/2023 EXAM: CT HEAD WITHOUT CONTRAST 08/15/2023 10:40:20 AM TECHNIQUE: CT of the head was performed without the administration of intravenous contrast. Automated exposure control, iterative reconstruction, and/or weight based adjustment of the mA/kV was utilized to reduce the radiation dose to as low as reasonably achievable. COMPARISON: MRI head 06/30/2023 and CT head 06/30/2023. CLINICAL HISTORY: Neuro deficit, acute, stroke suspected. Code stroke. Dr. Merrianne [id number]; OXT [id number]. Right side facial droop, abnormal speech.  FINDINGS: BRAIN AND VENTRICLES: No acute hemorrhage. Gray-white differentiation is preserved. No hydrocephalus. No extra-axial collection. No mass effect or midline shift. Nonspecific hypoattenuation in the periventricular and subcortical white matter, most likely representing chronic small vessel disease. Generalised parenchymal volume loss. Remote cortical infarcts in the parietal lobes. Remote lacunar infarcts in the bilateral thalami. Multiple remote infarcts in the cerebellum. ORBITS: No acute abnormality. SINUSES: No acute abnormality. SOFT TISSUES AND SKULL: No acute soft tissue abnormality. No skull fracture. [place] Stroke Program Early CT (ASPECT) Score: Ganglionic (caudate, IC, lentiform nucleus, insula, M1-M3): 7 Supraganglionic (M4-M6): 3 Total: 10 IMPRESSION: 1. No acute intracranial abnormality. 2. Multiple remote infarcts in the cerebellum, parietal lobes, and bilateral thalami. 3. Chronic small vessel disease. 4. Generalized parenchymal volume loss. 5. Findings messaged to Dr. Lindzen at [facility] on 08/15/23. Electronically signed by: Donnice Mania MD 08/15/2023 10:52 AM EDT RP Workstation: [id number]     Procedure Name: Intubation Date/Time: 08/15/2023 10:45 AM  Performed by: Yolande Lamar BROCKS, MDPre-anesthesia Checklist: Patient identified, Emergency Drugs available, Suction available and Timeout  performed Oxygen  Delivery Method: Nasal cannula Preoxygenation: Pre-oxygenation with 100% oxygen  Induction Type: IV induction and Rapid sequence Laryngoscope Size: Glidescope and 3 Grade View: Grade III Tube size: 7.5 mm Number of attempts: 1 Airway Equipment and Method: Rigid stylet and Video-laryngoscopy Placement Confirmation: ETT inserted through vocal cords under direct vision, Positive ETCO2, CO2 detector and Breath sounds checked- equal and bilateral Secured at: 22 cm Tube secured with: ETT holder Dental Injury: Teeth and Oropharynx as per pre-operative assessment  Difficulty Due To: Difficulty was unanticipated       Medications Ordered in the ED  Chlorhexidine  Gluconate Cloth 2 % PADS 6 each (6 each Topical Given 08/17/23 0322)  polyethylene glycol (MIRALAX  / GLYCOLAX ) packet 17 g (has no administration in time range)  aspirin  chewable tablet 81 mg (81 mg Per Tube Given 08/17/23 0850)  levETIRAcetam  (KEPPRA ) undiluted injection 1,000 mg (1,000 mg Intravenous Given 08/17/23 0907)  Oral care mouth rinse (15 mLs Mouth Rinse Given 08/17/23 0909)  Oral care mouth rinse (has no administration in time range)  multivitamin with minerals tablet 1 tablet (1 tablet Per Tube Given 08/17/23 0850)  docusate (COLACE) 50 MG/5ML liquid 100 mg (100 mg Per Tube Not Given 08/17/23 0909)  polyethylene glycol (MIRALAX  / GLYCOLAX ) packet 17 g (17 g Per Tube Not Given 08/17/23 0909)  insulin  aspart (novoLOG ) injection 0-9 Units ( Subcutaneous Not Given 08/17/23 0733)  famotidine  (PEPCID ) tablet 20 mg (20 mg Per Tube Given 08/17/23 0850)  enoxaparin  (LOVENOX ) injection 40 mg (40 mg Subcutaneous Given 08/16/23 1430)  norepinephrine  (LEVOPHED ) 4-5 MG/250ML-% infusion SOLN (  Not Given 08/16/23 1747)  thiamine  (VITAMIN B1) tablet 100 mg (100 mg Per Tube Given 08/17/23 0849)  feeding supplement (OSMOLITE 1.2 CAL) liquid 1,000 mL (0 mLs Per Tube Paused 08/17/23 0839)  acetaminophen  (TYLENOL ) tablet 650 mg (650 mg  Per Tube Given 08/17/23 0055)  thiamine  (VITAMIN B1) 200 mg in sodium chloride  0.9 % 50 mL IVPB (has no administration in time range)    Or  thiamine  (VITAMIN B1) tablet 100 mg (has no administration in time range)  levETIRAcetam  (KEPPRA ) undiluted injection 3,000 mg (3,000 mg Intravenous Given 08/15/23 1027)  midazolam  (VERSED ) injection 4 mg (4 mg Intravenous Given 08/15/23 1025)  ketamine  (KETALAR ) injection (50 mg Intravenous Given 08/15/23 1051)  rocuronium  (ZEMURON ) injection (50 mg Intravenous Given 08/15/23 1052)  fentaNYL  (SUBLIMAZE ) injection 100 mcg (100 mcg Intravenous Given 08/15/23 1100)  fentaNYL  (SUBLIMAZE )  injection 100 mcg (100 mcg Intravenous Given 08/15/23 1151)  magnesium  sulfate IVPB 2 g 50 mL (0 g Intravenous Stopped 08/15/23 1519)  potassium chloride  (KLOR-CON ) packet 40 mEq (40 mEq Per Tube Given 08/16/23 0610)  propofol  (DIPRIVAN ) 1000 MG/100ML infusion (  Duplicate 08/16/23 1221)    Clinical Course as of 08/17/23 1005  Thu Aug 15, 2023  1035 Full code verified with patient's husband [RP]  1100 Rolan sharps from [facility] to admit the patient [RP]    Clinical Course User Index [RP] Yolande Lamar BROCKS, MD                                 Medical Decision Making Amount and/or Complexity of Data Reviewed Labs: ordered. Radiology: ordered.  Risk Prescription drug management. Decision regarding hospitalization.   Jacqueline Bonilla is a 68 y.o. female with comorbidities that complicate the patient evaluation including L M2 stroke and seizure like activity who presents with seizure like activity    Initial Ddx:  Status epilepticus, seizure, stroke, ICH  MDM/Course:  Patient presents emergency department with seizure-like activity.  Was initially a code stroke because of unilateral weakness.  Unclear if this is new or old.  Is altered.  Did have some persistent twitching of her right thumb even after being given Versed  by EMS.  Required additional Versed .  Initially was  protecting her airway was able to be taken to get her head CT.  When she came back she had persistent twitching and due to concerns for possible airway compromise with additional antiepileptics and benzodiazepines decision was made to intubate the patient.  She was started on propofol .  She was admitted to the ICU.  Will have EEG and MRI as well.  Upon re-evaluation patient remained in critical condition  This patient presents to the ED for concern of complaints listed in HPI, this involves an extensive number of treatment options, and is a complaint that carries with it a high risk of complications and morbidity. Disposition including potential need for admission considered.   Dispo: ICU  Additional history obtained from spouse Records reviewed Admission Notes The following labs were independently interpreted: CMP without acute abnormality I independently reviewed the following imaging with scope of interpretation limited to determining acute life threatening conditions related to emergency care: CT Head and agree with the radiologist interpretation with the following exceptions: none I personally reviewed and interpreted cardiac monitoring: normal sinus rhythm  I personally reviewed and interpreted the pt's EKG: see above for interpretation  I have reviewed the patients home medications and made adjustments as needed Consults: Critical care and Neurology Social Determinants of health:  Geriatric  CRITICAL CARE Performed by: Lamar BROCKS Yolande   Total critical care time: 60 minutes  Critical care time was exclusive of separately billable procedures and treating other patients.  Critical care was necessary to treat or prevent imminent or life-threatening deterioration.  Critical care was time spent personally by me on the following activities: development of treatment plan with patient and/or surrogate as well as nursing, discussions with consultants, evaluation of patient's response to  treatment, examination of patient, obtaining history from patient or surrogate, ordering and performing treatments and interventions, ordering and review of laboratory studies, ordering and review of radiographic studies, pulse oximetry and re-evaluation of patient's condition.   Portions of this note were generated with Scientist, clinical (histocompatibility and immunogenetics). Dictation errors may occur despite best attempts at proofreading.  Final diagnoses:  Status epilepticus [facility])  Stupor    ED Discharge Orders     None          Yolande Lamar BROCKS, MD 08/17/23 1005    Yolande Lamar BROCKS, MD 08/17/23 1012

## 2023-08-15 NOTE — Code Documentation (Signed)
 Decision not to intubate at this time.

## 2023-08-15 NOTE — ED Triage Notes (Signed)
 Pt BIB [facility] from home as Code Stroke, LKW 0915. Family noted at 0920 she had a Rt sided facial droop & slurred speech initially. EMS arrived she began having a Rt gaze with decreased sats en route & they began bagging. Upon arrival she began seizing & was admn 2.5 versed  via IV in 18g Lt AC. Brought straight to room for possible intubation before CT. Seizure activity continued (noted in Rt thumb) by Neurology upon arrival to room.

## 2023-08-15 NOTE — TOC CM/SW Note (Signed)
 Transition of Care [facility]) - Inpatient Brief Assessment   Patient Details  Name: Jacqueline Bonilla MRN: [id number] Date of Birth: 12-Dec-1955  [facility] CM/SW Contact:    Lauraine FORBES Saa, LCSWA Phone Number: 08/15/2023, 1:39 PM   Clinical Narrative:  1:39 PM Per chart review, patient resides at home. Patient has a PCP and insurance. Patient has SNF history with [name]. Patient has CIR history with [facility]. Patient has HH history with [facility]. Patient has DME (electric wheelchair, cane, rolling walker) history. Patient's preferred pharmacy's are [facility], [facility], [facility] [address], and [place] [address] [address]. No TOC needs were identified at this time. TOC will continue to follow and be available to assist.  Transition of Care Asessment: Insurance and Status: Insurance coverage has been reviewed Patient has primary care physician: Yes Home environment has been reviewed: Private Residence Prior level of function:: N/A Prior/Current Home Services: No current home services (Has HH/DME history) Social Drivers of Health Review: SDOH reviewed no interventions necessary (As of July 1st, 2025. Patient is currently intubated.) Readmission risk has been reviewed: Yes (Currently Yellow 15%) Transition of care needs: no transition of care needs at this time

## 2023-08-15 NOTE — ED Notes (Signed)
 EEG at bedside.

## 2023-08-15 NOTE — ED Notes (Signed)
 X-ray at bedside.

## 2023-08-15 NOTE — Progress Notes (Addendum)
 Pt transported to 2M09. Pt started to reach for ETT with left hand, ED MD gave verbal order to place pt in soft restraints.

## 2023-08-15 NOTE — ED Notes (Signed)
 Pt brought to CT from [facility] with Stroke team, pharmacy & this RN at bedside.

## 2023-08-15 NOTE — Code Documentation (Signed)
 Stroke Response Nurse Documentation Code Documentation  Jacqueline Bonilla is a 68 y.o. female arriving to [facility]  via [facility] on 8/14 with past medical hx of CVA, vascular dementia. On No antithrombotic. Code stroke was activated by EMS.   Patient from home where she was LKW at 0915 and at 0920 developed R sided facial droop and abnormal speech. EMS was called at this time and en route her mental status began to decline, she developed R sided gaze, and then 3 minutes of RUE seizure activity. EMS assisting ventilations with BVM on arrival to ED.    Stroke team at the bedside on patient arrival. Labs drawn and patient cleared for CT by Dr. Yolande. Patient to CT with team. NIHSS 26, see documentation for details and code stroke times. Patient with decreased LOC, disoriented, not following commands, right gaze preference , left facial droop, bilateral arm weakness, bilateral leg weakness, Global aphasia , and dysarthria  on exam. The following imaging was completed:  CT Head. Patient is not a candidate for IV Thrombolytic due to stroke not suspected. Patient is not a candidate for IR due to no LVO suspected.   Care Plan: cancel LTM, MRI, cancel code stroke.   Bedside handoff with ED RN Carlyon.    Lauraine LITTIE Searle  Stroke Response RN

## 2023-08-15 NOTE — Code Documentation (Signed)
 Pt seizing upon arrival EMS gave 2.5 mg Versed  at time of arrival. Neurology at bedside noticed her Rt hand movement for possible continued seizure activity.

## 2023-08-15 NOTE — Progress Notes (Signed)
 STAT LTM EEG hooked up and running - no initial skin breakdown - push button tested - [facility] monitoring.

## 2023-08-15 NOTE — Progress Notes (Signed)
 EEG clinic is expecting an outpatient pediatric patient that will require 2 techs. Neuro and ED team have been notified of conflict. LTM EEG will be placed at the earliest opportunity.

## 2023-08-15 NOTE — Code Documentation (Signed)
 ET 7.5, 22 @ teeth.

## 2023-08-15 NOTE — H&P (Signed)
 NAME:  Jacqueline Bonilla, MRN:  [id number], DOB:  1955-05-30, LOS: 0 ADMISSION DATE:  08/15/2023, CONSULTATION DATE:  08/15/23 REFERRING MD:  Yolande CHIEF COMPLAINT:  Seizures   History of Present Illness:  Pt is encephelopathic; therefore, this HPI is obtained from chart review. Jacqueline Bonilla is a 68 y.o. 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  She was brought to [facility] ED on 8/14 initially as a code stroke after family noted that she had right-sided facial droop and slurred speech that morning.  When she arrived in the ED, she began seizing.  This initially resolved after Versed ; however, symptoms recurred.  She was ultimately intubated for airway protection.  She was evaluated by neurology.  It appears that she was recently started on Depakote  XR 500 mg daily in June 2025 when she was evaluated for seizures versus TIA.  It is unsure whether she has been compliant with this or not.  CT head was negative for any acute processes.  Multiple remote infarcts as well as chronic small vessel disease with parenchymal volume loss was noted.  EEG is pending.  Pertinent  Medical History:  has Essential hypertension; History of COVID-19; Vascular dementia without behavioral disturbance (HCC); Normocytic anemia; History of CVA (cerebrovascular accident); Seizure-like activity (HCC); Dyslipidemia; Internal hemorrhoids; Statin intolerance; Frequent falls; Gait disturbance, post-stroke; Hemiparesis affecting right side as late effect of cerebrovascular accident (CVA) (HCC); Apraxia, post-stroke; and Injury of left ankle on their problem list.  Significant Hospital Events: Including procedures, antibiotic start and stop dates in addition to other pertinent events   8/14 admit.  Interim History / Subjective:  Seen in ER, intubated/sedated/paralyzed.  Objective:  Blood pressure (!)  153/94, pulse 98, temperature 97.7 F (36.5 C), temperature source Temporal, resp. rate 18, height 5' 4 (1.626 m), weight 50 kg, last menstrual period 01/02/2003, SpO2 96%.       No intake or output data in the 24 hours ending 08/15/23 1102 Filed Weights   08/15/23 1030  Weight: 50 kg     Physical Exam: General: chronically ill intubated Neuro: pupils small slightly disconjugate, reactive; paralyzed at time of eval, per RN was not really moving pre-intubation just rhythmic twitching RUE HEENT: ETT minimal secretions Cardiovascular: regular, ext warm Lungs: clear, passive on vent Abdomen: soft, hypoactive Musculoskeletal: arthritic changes Skin: no rashes  Urine neg, labs benign, CXR clear  Labs/imaging personally reviewed:  CT head 8/14 > neg for acute process, multiple  remote infarcts as well as chronic small vessel disease with parenchymal volume loss was noted. EEG 8/14 >   Assessment & Plan:   Status epilepticus - possibly has underlying seizure disorder, recently evaluated in June 2025 and started on Depakote  (seizures vs TIA) - Unsure of her compliance. Hx prior L MCA stroke with residual RUE hemiplegia, vascular dementia. - Neurology following/managing, appreciate the assistance. -EEG per neuro. - AEDs per neuro. - Further imaging per neuro, eventual MRI - Standard seizure precautions. - Continue PTA ASA, Statin. - Maintain normotension  Inability to protect airway status post intubation - 2/2 above. - Full vent support. - Daily SBT once EEG clear. - Bronchial hygiene. - Defer empiric aspiration antibiotics for now as no history of any vomiting with seizure episodes. - CXR intermittently.  Hx HTN. - Continue PTA ASA. - Hold PTA Losartan .  Hx depression, anxiety. - Supportive care.   Best practice (evaluated  daily):  Diet/type: NPO DVT prophylaxis: prophylactic heparin   Pressure ulcer(s): pressure ulcer assessment deferred  GI prophylaxis: PPI Lines:  N/A Foley:  Yes, and it is still needed Code Status:  full code Last date of multidisciplinary goals of care discussion: None yet.  Labs   CBC: Recent Labs  Lab 08/15/23 1020 08/15/23 1025  WBC 7.1  --   NEUTROABS 4.6  --   HGB 13.3 13.9  HCT 41.0 41.0  MCV 91.3  --   PLT 212  --     Basic Metabolic Panel: Recent Labs  Lab 08/15/23 1025  NA 140  K 4.0  CL 102  GLUCOSE 134*  BUN 19  CREATININE 0.90   GFR: Estimated Creatinine Clearance: 45.9 mL/min (by C-G formula based on SCr of 0.9 mg/dL). Recent Labs  Lab 08/15/23 1020  WBC 7.1    Liver Function Tests: No results for input(s): AST, ALT, ALKPHOS, BILITOT, PROT, ALBUMIN in the last 168 hours. No results for input(s): LIPASE, AMYLASE in the last 168 hours. No results for input(s): AMMONIA in the last 168 hours.  ABG    Component Value Date/Time   TCO2 25 08/15/2023 1025     Coagulation Profile: No results for input(s): INR, PROTIME in the last 168 hours.  Cardiac Enzymes: No results for input(s): CKTOTAL, CKMB, CKMBINDEX, TROPONINI in the last 168 hours.  HbA1C: Hgb A1c MFr Bld  Date/Time Value Ref Range Status  07/01/2023 05:32 AM 5.5 4.8 - 5.6 % Final    Comment:    (NOTE) Diagnosis of Diabetes The following HbA1c ranges recommended by the American Diabetes Association (ADA) may be used as an aid in the diagnosis of diabetes mellitus.  Hemoglobin             Suggested A1C NGSP%              Diagnosis  <5.7                   Non Diabetic  5.7-6.4                Pre-Diabetic  >6.4                   Diabetic  <7.0                   Glycemic control for                       adults with diabetes.    02/19/2023 02:07 PM 5.7 (H) 4.8 - 5.6 % Final    Comment:             Prediabetes: 5.7 - 6.4          Diabetes: >6.4          Glycemic control for adults with diabetes: <7.0     CBG: Recent Labs  Lab 08/15/23 1018  GLUCAP 142*    Review of Systems:    Unable to obtain as pt is encephalopathic.  Past Medical History:  She,  has a past medical history of Anemia, Anxiety, Depression, Dysmenorrhea, Endometriosis, Fibroid, Hypertension, Implantable loop recorder present (2021), Microhematuria, Osteoporosis, Tachycardia, and Thrombocytopenia (HCC).   Surgical History:   Past Surgical History:  Procedure Laterality Date   BREAST BIOPSY     BUBBLE STUDY  12/08/2020   Procedure: BUBBLE STUDY;  Surgeon: Delford Maude BROCKS, MD;  Location: [facility] ENDOSCOPY;  Service: Cardiovascular;;   CESAREAN SECTION  hysteroscopic resection     implantable loop recorder implant  10/21/2019   Medtronic Reveal Linq model LNQ 22 ([place] [id number]) implantable loop recorder   IR CT HEAD LTD  12/01/2020   IR PERCUTANEOUS ART THROMBECTOMY/INFUSION INTRACRANIAL INC DIAG ANGIO  12/01/2020   IR US  GUIDE VASC ACCESS RIGHT  12/01/2020   ORIF HUMERUS FRACTURE Left 07/31/2021   Procedure: OPEN REDUCTION INTERNAL FIXATION (ORIF) DISTAL HUMERUS FRACTURE;  Surgeon: Kendal Franky SQUIBB, MD;  Location: [facility] OR;  Service: Orthopedics;  Laterality: Left;   RADIOLOGY WITH ANESTHESIA N/A 12/01/2020   Procedure: IR WITH ANESTHESIA - CODE STROKE;  Surgeon: Radiologist, Medication, MD;  Location: [facility] OR;  Service: Radiology;  Laterality: N/A;   TEE WITHOUT CARDIOVERSION N/A 12/08/2020   Procedure: TRANSESOPHAGEAL ECHOCARDIOGRAM (TEE);  Surgeon: Delford Maude BROCKS, MD;  Location: [facility] ENDOSCOPY;  Service: Cardiovascular;  Laterality: N/A;     Social History:   reports that she has never smoked. She has never used smokeless tobacco. She reports that she does not drink alcohol and does not use drugs.   Family History:  Her family history includes Cancer in her father; Dementia in her mother; Heart attack in her maternal grandfather; Polymyositis in her sister.   Allergies Allergies  Allergen Reactions   Iohexol  Other (See Comments)     Code: HIVES, Desc: HIVES S/P IVP MANY YRS AGO    Plavix   [Clopidogrel ] Diarrhea   Quinolones Hives    Cipro, Levaquin, Avelox all cause hives   Shellfish Allergy Itching    Itching under the chin. Described by patient but not seen by husband.     Home Medications  Prior to Admission medications   Medication Sig Start Date End Date Taking? Authorizing Provider  Amino Acids  Complex TABS Take 1 tablet by mouth daily.    [provider]  aspirin  81 MG chewable tablet Chew 1 tablet (81 mg total) by mouth daily. 04/02/23   Remi Pippin, NP  atorvastatin  (LIPITOR) 40 MG tablet Take 1 tablet (40 mg total) by mouth daily. 05/29/23   Purcell Emil Schanz, MD  CINNAMON  PO Take 1 capsule by mouth daily.    [provider]  Coenzyme Q10 (CO Q 10 PO) Take 1 capsule by mouth daily.    [provider]  divalproex  (DEPAKOTE  ER) 500 MG 24 hr tablet Take 1 tablet (500 mg total) by mouth daily. 07/16/23 07/15/24  Sagardia, Miguel Jose, MD  losartan  (COZAAR ) 25 MG tablet Take 0.5 tablets (12.5 mg total) by mouth daily. 04/08/23   Angiulli, Toribio PARAS, PA-C  MAGNESIUM  PO Take 1 tablet by mouth at bedtime.    [provider]  Multiple Vitamin (MULTIVITAMIN WITH MINERALS) TABS tablet Take 1 tablet by mouth daily.    [provider]  Multiple Vitamins-Minerals (ZINC  PO) Take 1 tablet by mouth daily.    [provider]  Study - LIBREXIA-STROKE - milvexian 25 mg or placebo tablet (PI-Sethi) Take 1 tablet by mouth 2 (two) times daily. 06/26/23   Rosemarie Eather RAMAN, MD     Critical care time: 31 mins

## 2023-08-16 ENCOUNTER — Inpatient Hospital Stay (HOSPITAL_COMMUNITY)

## 2023-08-16 ENCOUNTER — Inpatient Hospital Stay (HOSPITAL_COMMUNITY): Admit: 2023-08-16

## 2023-08-16 DIAGNOSIS — R9082 White matter disease, unspecified: Secondary | ICD-10-CM | POA: Diagnosis not present

## 2023-08-16 DIAGNOSIS — G9340 Encephalopathy, unspecified: Secondary | ICD-10-CM

## 2023-08-16 DIAGNOSIS — G9389 Other specified disorders of brain: Secondary | ICD-10-CM | POA: Diagnosis not present

## 2023-08-16 DIAGNOSIS — J9601 Acute respiratory failure with hypoxia: Secondary | ICD-10-CM | POA: Diagnosis not present

## 2023-08-16 DIAGNOSIS — E440 Moderate protein-calorie malnutrition: Secondary | ICD-10-CM | POA: Insufficient documentation

## 2023-08-16 DIAGNOSIS — R569 Unspecified convulsions: Secondary | ICD-10-CM | POA: Diagnosis not present

## 2023-08-16 DIAGNOSIS — R9089 Other abnormal findings on diagnostic imaging of central nervous system: Secondary | ICD-10-CM | POA: Diagnosis not present

## 2023-08-16 DIAGNOSIS — I10 Essential (primary) hypertension: Secondary | ICD-10-CM | POA: Diagnosis not present

## 2023-08-16 DIAGNOSIS — G40909 Epilepsy, unspecified, not intractable, without status epilepticus: Secondary | ICD-10-CM

## 2023-08-16 DIAGNOSIS — I639 Cerebral infarction, unspecified: Secondary | ICD-10-CM | POA: Diagnosis not present

## 2023-08-16 LAB — CBC
HCT: 35.4 % — ABNORMAL LOW (ref 36.0–46.0)
Hemoglobin: 11.6 g/dL — ABNORMAL LOW (ref 12.0–15.0)
MCH: 29.1 pg (ref 26.0–34.0)
MCHC: 32.8 g/dL (ref 30.0–36.0)
MCV: 88.7 fL (ref 80.0–100.0)
Platelets: 217 K/uL (ref 150–400)
RBC: 3.99 MIL/uL (ref 3.87–5.11)
RDW: 14.1 % (ref 11.5–15.5)
WBC: 5.9 K/uL (ref 4.0–10.5)
nRBC: 0 % (ref 0.0–0.2)

## 2023-08-16 LAB — BASIC METABOLIC PANEL WITH GFR
Anion gap: 12 (ref 5–15)
BUN: 13 mg/dL (ref 8–23)
CO2: 21 mmol/L — ABNORMAL LOW (ref 22–32)
Calcium: 8.9 mg/dL (ref 8.9–10.3)
Chloride: 100 mmol/L (ref 98–111)
Creatinine, Ser: 0.8 mg/dL (ref 0.44–1.00)
GFR, Estimated: >60 mL/min (ref >60)
Glucose, Bld: 122 mg/dL — ABNORMAL HIGH (ref 70–99)
Potassium: 3.5 mmol/L (ref 3.5–5.1)
Sodium: 133 mmol/L — ABNORMAL LOW (ref 135–145)

## 2023-08-16 LAB — PHOSPHORUS: Phosphorus: 3.7 mg/dL (ref 2.5–4.6)

## 2023-08-16 LAB — GLUCOSE, CAPILLARY
Glucose-Capillary: 120 mg/dL — ABNORMAL HIGH (ref 70–99)
Glucose-Capillary: 102 mg/dL — ABNORMAL HIGH (ref 70–99)
Glucose-Capillary: 110 mg/dL — ABNORMAL HIGH (ref 70–99)
Glucose-Capillary: 99 mg/dL (ref 70–99)
Glucose-Capillary: 85 mg/dL (ref 70–99)
Glucose-Capillary: 141 mg/dL — ABNORMAL HIGH (ref 70–99)

## 2023-08-16 LAB — TRIGLYCERIDES: Triglycerides: 89 mg/dL (ref <150)

## 2023-08-16 LAB — MAGNESIUM: Magnesium: 2 mg/dL (ref 1.7–2.4)

## 2023-08-16 LAB — AMMONIA: Ammonia: 21 umol/L (ref 9–35)

## 2023-08-16 MED ORDER — DEXMEDETOMIDINE HCL IN NACL 400 MCG/100ML IV SOLN
0.0000 ug/kg/h | INTRAVENOUS | Status: DC
  Administered 2023-08-16: 0.4 ug/kg/h via INTRAVENOUS
  Filled 2023-08-16 (×2): qty 100

## 2023-08-16 MED ORDER — OSMOLITE 1.2 CAL PO LIQD
1000.0000 mL | ORAL | Status: DC
  Administered 2023-08-16: 1000 mL
  Filled 2023-08-16 (×6): qty 1000

## 2023-08-16 MED ORDER — PROPOFOL 1000 MG/100ML IV EMUL
INTRAVENOUS | Status: AC
  Filled 2023-08-16: qty 100

## 2023-08-16 MED ORDER — FAMOTIDINE 20 MG PO TABS
20.0000 mg | ORAL_TABLET | Freq: Two times a day (BID) | ORAL | Status: DC
  Administered 2023-08-16 – 2023-08-17 (×3): 20 mg
  Filled 2023-08-16 (×3): qty 1

## 2023-08-16 MED ORDER — POTASSIUM CHLORIDE 20 MEQ PO PACK
40.0000 meq | PACK | Freq: Once | ORAL | Status: AC
  Administered 2023-08-16: 40 meq
  Filled 2023-08-16: qty 2

## 2023-08-16 MED ORDER — ENOXAPARIN SODIUM 40 MG/0.4ML IJ SOSY
40.0000 mg | PREFILLED_SYRINGE | INTRAMUSCULAR | Status: DC
  Administered 2023-08-16 – 2023-08-25 (×10): 40 mg via SUBCUTANEOUS
  Filled 2023-08-16 (×10): qty 0.4

## 2023-08-16 MED ORDER — NOREPINEPHRINE 4 MG/250ML-% IV SOLN
INTRAVENOUS | Status: AC
Start: 2023-08-16 — End: 2023-08-17
  Filled 2023-08-16: qty 250

## 2023-08-16 MED ORDER — THIAMINE MONONITRATE 100 MG PO TABS
100.0000 mg | ORAL_TABLET | Freq: Every day | ORAL | Status: DC
  Administered 2023-08-16 – 2023-08-17 (×2): 100 mg
  Filled 2023-08-16 (×2): qty 1

## 2023-08-16 MED ORDER — PROPOFOL 1000 MG/100ML IV EMUL
0.0000 ug/kg/min | INTRAVENOUS | Status: DC
  Administered 2023-08-16: 10 ug/kg/min via INTRAVENOUS
  Administered 2023-08-16: 25 ug/kg/min via INTRAVENOUS
  Filled 2023-08-16: qty 100

## 2023-08-16 NOTE — Progress Notes (Signed)
 NAME:  Jacqueline Bonilla, MRN:  [id number], DOB:  [birth year], LOS: 1 ADMISSION DATE:  08/15/2023, CONSULTATION DATE:  08/16/2023 REFERRING MD:  EDP , CHIEF COMPLAINT:  Stroke    History of Present Illness:  Jacqueline Bonilla is a 68 y.o. 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  Pertinent  Medical History   Past Medical History:  Diagnosis Date   Anemia    Anxiety    Depression    Dysmenorrhea    Endometriosis    Fibroid    Hypertension    Implantable loop recorder present 2021   Microhematuria    negative workup   Osteoporosis    Tachycardia    Thrombocytopenia (HCC)      Significant Hospital Events: Including procedures, antibiotic start and stop dates in addition to other pertinent events   8/14 : Admitted 8/14: Intubated   Interim History / Subjective:  Remain intubated   Objective   Blood pressure 134/78, pulse 100, temperature 98.4 F (36.9 C), temperature source Axillary, resp. rate 16, height 5' 4 (1.626 m), weight 51.3 kg, last menstrual period 01/02/2003, SpO2 100%.    Vent Mode: PRVC FiO2 (%):  [50 %-100 %] 50 % Set Rate:  [16 bmp] 16 bmp Vt Set:  [430 mL] 430 mL PEEP:  [5 cmH20] 5 cmH20 Plateau Pressure:  [12 cmH20-14 cmH20] 12 cmH20   Intake/Output Summary (Last 24 hours) at 08/16/2023 0757 Last data filed at 08/16/2023 0600 Gross per 24 hour  Intake 431.24 ml  Output 1070 ml  Net -638.76 ml   Filed Weights   08/15/23 1030 08/15/23 1300 08/16/23 0235  Weight: 50 kg 52.4 kg 51.3 kg    Examination: General: chronically ill intubated HEENT: ETT minimal secretions Cardiovascular: RRR Lungs: clear, passive on vent Abdomen: soft, hypoactive Musculoskeletal:  arthritic changes Skin: no rashes  No cultures   CT HEAD STROKE  IMPRESSION: 1. No acute intracranial abnormality. 2. Multiple remote infarcts in the cerebellum, parietal lobes, and bilateral thalami. 3. Chronic small vessel disease. 4. Generalized parenchymal volume loss  Resolved Hospital Problem list   None   Assessment & Plan:  Status epilepticus  Concern for acute stroke  Hx prior L MCA stroke with residual RUE hemiplegia, vascular dementia. - CT head negative for new stroke  - Pending head MRI  - EEG is running - Standard seizure precautions. - Neurology is following , appreciate their recs    Acute hypoxic respiratory failure  - Full vent support. - Wean as tolerated  - Daily SBT  - Bronchial hygiene. - No indication for antibiotics    Hx HTN. -On home Losartan   - BP is soft - Will hold home med for now   Hypernatremia - Mild at 133, likely due to NPO status - Will continue to monitor   Hyperglycemia in the absence of diabetes  - Blood glucose of 102   Hx depression, anxiety - Supportive care - SSI   DISPO : ICU appropriate     Best Practice (right click and Reselect all SmartList Selections daily)   Diet/type: NPO DVT prophylaxis: prophylactic heparin   GI prophylaxis: PPI Lines: N/A Foley:  Yes, and it is no longer needed  Code Status:  full code Last date of multidisciplinary goals of care discussion [none yet]  Labs   CBC: Recent Labs  Lab 08/15/23 1020 08/15/23 1025 08/15/23 1219 08/15/23 1348 08/16/23 0258  WBC 7.1  --   --  5.8 5.9  NEUTROABS 4.6  --   --   --   --   HGB 13.3 13.9 12.6 13.8 11.6*  HCT 41.0 41.0 37.0 42.8 35.4*  MCV 91.3  --   --  91.8 88.7  PLT 212  --   --  191 217    Basic Metabolic Panel: Recent Labs  Lab 08/15/23 1020 08/15/23 1025 08/15/23 1219 08/16/23 0258  NA 137 140 136 133*  K 4.0 4.0 4.1 3.5  CL 103 102  --  100  CO2 21*  --   --  21*  GLUCOSE 133* 134*  --  122*  BUN 16 19  --  13   CREATININE 0.91 0.90  --  0.80  CALCIUM  9.4  --   --  8.9  MG 1.9  --   --  2.0  PHOS  --   --   --  3.7   GFR: Estimated Creatinine Clearance: 53 mL/min (by C-G formula based on SCr of 0.8 mg/dL). Recent Labs  Lab 08/15/23 1020 08/15/23 1348 08/16/23 0258  WBC 7.1 5.8 5.9  LATICACIDVEN  --  1.7  --     Liver Function Tests: Recent Labs  Lab 08/15/23 1020  AST 23  ALT 13  ALKPHOS 53  BILITOT 0.4  PROT 7.8  ALBUMIN 3.7   No results for input(s): LIPASE, AMYLASE in the last 168 hours. Recent Labs  Lab 08/16/23 0258  AMMONIA 21    ABG    Component Value Date/Time   PHART 7.430 08/15/2023 1219   PCO2ART 41.3 08/15/2023 1219   PO2ART 360 (H) 08/15/2023 1219   HCO3 27.4 08/15/2023 1219   TCO2 29 08/15/2023 1219   O2SAT 100 08/15/2023 1219     Coagulation Profile: No results for input(s): INR, PROTIME in the last 168 hours.  Cardiac Enzymes: Recent Labs  Lab 08/15/23 1348  CKTOTAL 79    HbA1C: Hgb A1c MFr Bld  Date/Time Value Ref Range Status  07/01/2023 05:32 AM 5.5 4.8 - 5.6 % Final    Comment:    (NOTE) Diagnosis of Diabetes The following HbA1c ranges recommended by the American Diabetes Association (ADA) may be used as an aid in the diagnosis of diabetes mellitus.  Hemoglobin             Suggested A1C NGSP%              Diagnosis  <5.7                   Non Diabetic  5.7-6.4                Pre-Diabetic  >6.4                   Diabetic  <7.0                   Glycemic control for                       adults with diabetes.    02/19/2023 02:07 PM 5.7 (H) 4.8 - 5.6 % Final    Comment:             Prediabetes: 5.7 -  6.4          Diabetes: >6.4          Glycemic control for adults with diabetes: <7.0     CBG: Recent Labs  Lab 08/15/23 1534 08/15/23 1934 08/15/23 2329 08/16/23 0328 08/16/23 0718  GLUCAP 87 103* 111* 120* 102*    Review of Systems:   Unable to assess   Past Medical History:  She,  has a past medical  history of Anemia, Anxiety, Depression, Dysmenorrhea, Endometriosis, Fibroid, Hypertension, Implantable loop recorder present (2021), Microhematuria, Osteoporosis, Tachycardia, and Thrombocytopenia (HCC).   Surgical History:   Past Surgical History:  Procedure Laterality Date   BREAST BIOPSY     BUBBLE STUDY  12/08/2020   Procedure: BUBBLE STUDY;  Surgeon: Delford Maude BROCKS, MD;  Location: [facility] ENDOSCOPY;  Service: Cardiovascular;;   CESAREAN SECTION     hysteroscopic resection     implantable loop recorder implant  10/21/2019   Medtronic Reveal Linq model LNQ 22 ([place] [id number]) implantable loop recorder   IR CT HEAD LTD  12/01/2020   IR PERCUTANEOUS ART THROMBECTOMY/INFUSION INTRACRANIAL INC DIAG ANGIO  12/01/2020   IR US  GUIDE VASC ACCESS RIGHT  12/01/2020   ORIF HUMERUS FRACTURE Left 07/31/2021   Procedure: OPEN REDUCTION INTERNAL FIXATION (ORIF) DISTAL HUMERUS FRACTURE;  Surgeon: Kendal Franky SQUIBB, MD;  Location: [facility] OR;  Service: Orthopedics;  Laterality: Left;   RADIOLOGY WITH ANESTHESIA N/A 12/01/2020   Procedure: IR WITH ANESTHESIA - CODE STROKE;  Surgeon: Radiologist, Medication, MD;  Location: [facility] OR;  Service: Radiology;  Laterality: N/A;   TEE WITHOUT CARDIOVERSION N/A 12/08/2020   Procedure: TRANSESOPHAGEAL ECHOCARDIOGRAM (TEE);  Surgeon: Delford Maude BROCKS, MD;  Location: [facility] ENDOSCOPY;  Service: Cardiovascular;  Laterality: N/A;     Social History:   reports that she has never smoked. She has never used smokeless tobacco. She reports that she does not drink alcohol and does not use drugs.   Family History:  Her family history includes Cancer in her father; Dementia in her mother; Heart attack in her maternal grandfather; Polymyositis in her sister.   Allergies Allergies  Allergen Reactions   Iodinated Contrast Media Hives   Iohexol  Hives   Plavix  [Clopidogrel ] Diarrhea   Quinolones Hives    Reaction with Cipro, Levaquin, Avelox   Shellfish Allergy Itching     Home Medications   Prior to Admission medications   Medication Sig Start Date End Date Taking? Authorizing Provider  aspirin  81 MG chewable tablet Chew 1 tablet (81 mg total) by mouth daily. 04/02/23  Yes Shafer, Jorene, NP  atorvastatin  (LIPITOR) 40 MG tablet Take 1 tablet (40 mg total) by mouth daily. 05/29/23  Yes Purcell Emil Schanz, MD  CINNAMON  PO Take 1 capsule by mouth daily.   Yes [provider]  Coenzyme Q10 (CO Q 10 PO) Take 1 capsule by mouth daily.   Yes [provider]  divalproex  (DEPAKOTE  ER) 500 MG 24 hr tablet Take 1 tablet (500 mg total) by mouth daily. 07/16/23 07/15/24 Yes Sagardia, Miguel Jose, MD  Ferrous Sulfate (IRON PO) Take 1 tablet by mouth daily.   Yes [provider]  losartan  (COZAAR ) 25 MG tablet Take 0.5 tablets (12.5 mg total) by mouth daily. 04/08/23  Yes Angiulli, Toribio PARAS, PA-C  MAGNESIUM  PO Take 1 tablet by mouth at bedtime.   Yes [provider]  Multiple Vitamin (MULTIVITAMIN WITH MINERALS) TABS tablet Take 1 tablet by mouth daily. Liposomal multivitamin/minerals   Yes [provider]  OVER THE COUNTER MEDICATION Take 1 tablet by mouth daily. OTC vitamin C + quercetin   Yes [provider]  Study - LIBREXIA-STROKE - milvexian 25 mg or placebo tablet (PI-Sethi) Take 1 tablet by mouth 2 (two) times daily. 06/26/23  Yes Rosemarie Eather RAMAN, MD      Drue Grow MD [facility] Internal Medicine Program - PGY-2 08/16/2023, 7:57 AM Pager# [phone]

## 2023-08-16 NOTE — Progress Notes (Signed)
 RT transported pt on ventilator from 2M09 to MRI and back to 2M09 without any complications. RN at bedside.

## 2023-08-16 NOTE — Progress Notes (Signed)
 [facility] ADULT ICU REPLACEMENT PROTOCOL   The patient does apply for the [facility] Adult ICU Electrolyte Replacment Protocol based on the criteria listed below:   1.Exclusion criteria: TCTS, ECMO, Dialysis, and Myasthenia Gravis patients 2. Is GFR >/= 30 ml/min? Yes.    Patient's GFR today is >60 3. Is SCr </= 2? Yes.   Patient's SCr is 0.8 mg/dL 4. Did SCr increase >/= 0.5 in 24 hours? No. 5.Pt's weight >40kg  Yes.   6. Abnormal electrolyte(s):   K 3.5  7. Electrolytes replaced per protocol 8.  Call MD STAT for K+ </= 2.5, Phos </= 1, or Mag </= 1 Physician:  KYM Slater Kassie Harlene R Amariz Flamenco 08/16/2023 5:59 AM

## 2023-08-16 NOTE — Progress Notes (Signed)
 Initial Nutrition Assessment  DOCUMENTATION CODES:  Non-severe (moderate) malnutrition in context of chronic illness  INTERVENTION:  Initiate tube feeding via OGT: Osmolite 1.2 at 55 ml/h (1320 ml per day) Start at 25 and advance by 10mL every 12 hours to reach goal Provides 1584 kcal, 73 gm protein, 1082 ml free water daily Pt is at risk for refeeding syndrome given malnutrition. Monitor magnesium  and phosphorus daily x 2 days, MD to replete as needed. 100mg  thiamine  x 5 days  NUTRITION DIAGNOSIS:  Moderate Malnutrition related to chronic illness (prior CVAs) as evidenced by moderate muscle depletion, moderate fat depletion.  GOAL:  Patient will meet greater than or equal to 90% of their needs  MONITOR:  TF tolerance, Vent status, Labs  REASON FOR ASSESSMENT:  Ventilator    ASSESSMENT:  Pt with hx of seizure disorder, HTN, HLD, hx multiple CVAs, and osteoporosis presented to ED with active seizures.  8/14 - presented to ED, intubated   Patient is currently intubated on ventilator support. No family present at the time of assessment. Pt currently sedated and plans for MRI later this afternoon. Was unable to be extubated this AM but hopeful to retry this afternoon.   Pt discussed during ICU rounds and with RN and MD. If unable to extubate, will initiate enteral feed and attempt to wean again tomorrow.   On exam, pt very thin with loss of fat and muscle stores throughout body. Noted that during previous admission in 2022 with a CVA, pt was diagnosed with malnutrition. Pt has lost additional weight since that time and muscle deficits have worsened. Overall, still classified as moderate malnutrition.   MV: 6.7 L/min Temp (24hrs), Avg:98.7 F (37.1 C), Min:98 F (36.7 C), Max:99.4 F (37.4 C) MAP (cuff): 71 mmHg  Propofol : 3.08 ml/hr (81 kcal/d)  Admit weight: 50 kg  Current weight: 51.3 kg 4.3% weight loss noted over the last 3 months, not severe   Intake/Output Summary  (Last 24 hours) at 08/16/2023 1437 Last data filed at 08/16/2023 1217 Gross per 24 hour  Intake 454.91 ml  Output 595 ml  Net -140.09 ml  Net IO Since Admission: -587.37 mL [08/16/23 1437]  Drains/Lines: OGT 18 Fr. (Gastric) UOP x 24 hours  Nutritionally Relevant Medications: Scheduled Meds:  docusate  100 mg Per Tube BID   famotidine   20 mg Per Tube BID   insulin  aspart  0-9 Units Subcutaneous Q4H   multivitamin with minerals  1 tablet Per Tube Daily   polyethylene glycol  17 g Per Tube Daily   Continuous Infusions:  norepinephrine      propofol  (DIPRIVAN ) infusion 10 mcg/kg/min (08/16/23 1222)   PRN Meds: polyethylene glycol  Labs Reviewed: Sodium 133 CBG ranges from 102-120 mg/dL over the last 24 hours HgbA1c 5.5% (6/30)  NUTRITION - FOCUSED PHYSICAL EXAM: Flowsheet Row Most Recent Value  Orbital Region Severe depletion  Upper Arm Region Mild depletion  Thoracic and Lumbar Region Moderate depletion  Buccal Region Severe depletion  Temple Region Moderate depletion  Clavicle Bone Region Moderate depletion  Clavicle and Acromion Bone Region Mild depletion  Scapular Bone Region Mild depletion  Dorsal Hand Unable to assess  [mittens]  Patellar Region Moderate depletion  Anterior Thigh Region Moderate depletion  Posterior Calf Region Mild depletion  Edema (RD Assessment) None  Hair Reviewed  Eyes Reviewed  Mouth Reviewed  Skin Reviewed  Nails Reviewed    Diet Order:   Diet Order  Diet NPO time specified  Diet effective now                   EDUCATION NEEDS:  Not appropriate for education at this time  Skin:  Skin Assessment: Reviewed RN Assessment  Last BM:  prior to admission  Height:  Ht Readings from Last 1 Encounters:  08/15/23 5' 4 (1.626 m)    Weight:  Wt Readings from Last 1 Encounters:  08/16/23 51.3 kg    Ideal Body Weight:  54.5 kg  BMI:  Body mass index is 19.41 kg/m.  Estimated Nutritional Needs:  Kcal:   1400-1600 kcal/d Protein:  70-90g/d Fluid:  >/=1.5L/d    Vernell Lukes, RD, LDN, CNSC Registered Dietitian II Please reach out via secure chat

## 2023-08-16 NOTE — Procedures (Addendum)
 Patient Name: Jacqueline Bonilla  MRN: [id number]  Epilepsy Attending: Arlin MALVA Krebs  Referring Physician/Provider: Merrianne Locus, MD  Duration: 08/15/2023 1201 to 08/16/2023 1201  Patient history:  68yo F presented to the ED today with initial symptoms of right facial weakness and speech disturbance with a right forced gaze, oxygen  desaturation, and seizure activity en route to the ED. EEG to evaluate for seizure  Level of alertness:  comatose/lethargic  AEDs during EEG study: LEV, Propofol   Technical aspects: This EEG study was done with scalp electrodes positioned according to the 10-20 International system of electrode placement. Electrical activity was reviewed with band pass filter of 1-70Hz , sensitivity of 7 uV/mm, display speed of 32mm/sec with a 60Hz  notched filter applied as appropriate. EEG data were recorded continuously and digitally stored.  Video monitoring was available and reviewed as appropriate.  Description: At the beginning of the study, EEG showed lateralized periodic discharges with fluctuating frequency of 1 to 2.5 Hz with overlying rhythmicity lasting for 15 seconds alternating with 1 to 2 seconds of generalized EEG attenuation.  No clinical signs were noted.  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  EEG was then disconnected on 08/15/2023 at 1206 as patient was transferred to a different room.  Subsequently when EEG was reconnected at 1241, EEG showed continuous generalized and lateralized left hemisphere 3 to 6 Hz theta slowing with overriding 13 to 15 Hz beta activity. Hyperventilation and photic stimulation were not performed.     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  EEG was disconnected on  08/15/2023 at 1206 as patient was being transferred.  Subsequently when EEG was reconnected at 1241, status epilepticus had resolved.    EEG was then suggestive of cortical dysfunction in left hemisphere likely secondary to underlying structural abnormality, postictal state.  Additionally there was moderate diffuse encephalopathy.  Kellis Mcadam O Nyala Kirchner

## 2023-08-16 NOTE — Plan of Care (Signed)
  Problem: Education: Goal: Knowledge of General Education information will improve Description: Including pain rating scale, medication(s)/side effects and non-pharmacologic comfort measures Outcome: Not Progressing   Problem: Clinical Measurements: Goal: Diagnostic test results will improve Outcome: Progressing Goal: Respiratory complications will improve Outcome: Progressing Goal: Cardiovascular complication will be avoided Outcome: Progressing   Problem: Clinical Measurements: Goal: Respiratory complications will improve Outcome: Progressing   Problem: Clinical Measurements: Goal: Cardiovascular complication will be avoided Outcome: Progressing   Problem: Elimination: Goal: Will not experience complications related to urinary retention Outcome: Progressing   Problem: Pain Managment: Goal: General experience of comfort will improve and/or be controlled Outcome: Progressing   Problem: Safety: Goal: Non-violent Restraint(s) Outcome: Progressing

## 2023-08-16 NOTE — Progress Notes (Signed)
 NEUROLOGY CONSULT FOLLOW UP NOTE   Date of service: August 16, 2023 Patient Name: Jacqueline Bonilla MRN:  [id number] DOB:  09-01-1955  Interval Hx/subjective  EEG shows resolution of status, remains intubated. Plan to extubate tomorrow.  Patient is going to MRI.  Vitals   Vitals:   08/16/23 0800 08/16/23 1113 08/16/23 1200 08/16/23 1221  BP: 117/80  (!) 77/50 102/63  Pulse: 91  66 69  Resp: 16  16 14   Temp:  98.6 F (37 C)    TempSrc:  Axillary    SpO2: 100%  100% 100%  Weight:      Height:         Body mass index is 19.41 kg/m.  Physical Exam   Constitutional: NAD  Psych: Calm, propofol  infusing Eyes: No scleral injection.  HENT: ETT in place Head: Normocephalic.  Cardiovascular: Normal rate and regular rhythm.  Respiratory: Mechanically ventilated  GI: Soft.  No distension. There is no tenderness.  Skin: WDI.   Neuro: Mental Status: Patient is sedated with propofol , no verbal output Nodding appropriately does follow commands Cranial Nerves: II: PERRL III,IV, VI:  Eyes cross midline  VII: Facial movement is obscured by ETT VIII: Hearing is intact to voice X: Cough and gag intact XI: Head is midline XII: Tongue protrudes midline without atrophy or fasciculations.  Motor: Tone is normal. Bulk is normal. Moves all extremities against gravity. No focal weakness appreciated Sensory: Localizes to painful stimuli.  Cerebellar: Unable to perform   Medications  Current Facility-Administered Medications:    aspirin  chewable tablet 81 mg, 81 mg, Per Tube, Daily, Claudene Toribio BROCKS, MD, 81 mg at 08/16/23 0810   Chlorhexidine  Gluconate Cloth 2 % PADS 6 each, 6 each, Topical, Daily, Gleason, Leita SAUNDERS, PA-C, 6 each at 08/16/23 0810   dexmedetomidine  (PRECEDEX ) 400 MCG/100ML (4 mcg/mL) infusion, 0-1.2 mcg/kg/hr, Intravenous, Titrated, Alghanim, Fahid, MD, Stopped at 08/16/23 1203   docusate (COLACE) 50 MG/5ML liquid 100 mg, 100 mg, Per Tube, BID, Claudene Toribio BROCKS, MD,  100 mg at 08/16/23 9189   enoxaparin  (LOVENOX ) injection 40 mg, 40 mg, Subcutaneous, Q24H, Alghanim, Fahid, MD   famotidine  (PEPCID ) tablet 20 mg, 20 mg, Per Tube, BID, Claudene Toribio BROCKS, MD, 20 mg at 08/16/23 9189   fentaNYL  (SUBLIMAZE ) injection 50-200 mcg, 50-200 mcg, Intravenous, Q30 min PRN, Claudene Toribio BROCKS, MD, 50 mcg at 08/16/23 9062   insulin  aspart (novoLOG ) injection 0-9 Units, 0-9 Units, Subcutaneous, Q4H, Claudene Toribio BROCKS, MD   levETIRAcetam  (KEPPRA ) undiluted injection 1,000 mg, 1,000 mg, Intravenous, Q12H, Collier Bohnet, MD, 1,000 mg at 08/16/23 9083   multivitamin with minerals tablet 1 tablet, 1 tablet, Per Tube, Daily, Denninger, Jade M, RPH, 1 tablet at 08/16/23 9188   norepinephrine  (LEVOPHED ) 4-5 MG/250ML-% infusion SOLN, , , ,    Oral care mouth rinse, 15 mL, Mouth Rinse, Q2H, Claudene Toribio BROCKS, MD, 15 mL at 08/16/23 1222   Oral care mouth rinse, 15 mL, Mouth Rinse, PRN, Claudene Toribio BROCKS, MD   polyethylene glycol (MIRALAX  / GLYCOLAX ) packet 17 g, 17 g, Per Tube, Daily PRN, Gleason, Leita SAUNDERS, PA-C   polyethylene glycol (MIRALAX  / GLYCOLAX ) packet 17 g, 17 g, Per Tube, Daily, Claudene Toribio BROCKS, MD, 17 g at 08/16/23 0810   propofol  (DIPRIVAN ) 1000 MG/100ML infusion, 0-80 mcg/kg/min, Intravenous, Titrated, Alghanim, Fahid, MD, Last Rate: 3.08 mL/hr at 08/16/23 1222, 10 mcg/kg/min at 08/16/23 1222  Labs and Diagnostic Imaging   CBC:  Recent Labs  Lab 08/15/23 1020 08/15/23 1025  08/15/23 1348 08/16/23 0258  WBC 7.1  --  5.8 5.9  NEUTROABS 4.6  --   --   --   HGB 13.3   < > 13.8 11.6*  HCT 41.0   < > 42.8 35.4*  MCV 91.3  --  91.8 88.7  PLT 212  --  191 217   < > = values in this interval not displayed.    Basic Metabolic Panel:  Lab Results  Component Value Date   NA 133 (L) 08/16/2023   K 3.5 08/16/2023   CO2 21 (L) 08/16/2023   GLUCOSE 122 (H) 08/16/2023   BUN 13 08/16/2023   CREATININE 0.80 08/16/2023   CALCIUM  8.9 08/16/2023   GFRNONAA >60 08/16/2023   GFRAA 78  07/20/2019   Lipid Panel:  Lab Results  Component Value Date   LDLCALC 99 07/01/2023   HgbA1c:  Lab Results  Component Value Date   HGBA1C 5.5 07/01/2023   Urine Drug Screen:     Component Value Date/Time   LABOPIA NONE DETECTED 08/15/2023 1024   COCAINSCRNUR NONE DETECTED 08/15/2023 1024   LABBENZ POSITIVE (A) 08/15/2023 1024   AMPHETMU NONE DETECTED 08/15/2023 1024   THCU NONE DETECTED 08/15/2023 1024   LABBARB NONE DETECTED 08/15/2023 1024    Alcohol Level     Component Value Date/Time   ETH <15 08/15/2023 1020   INR  Lab Results  Component Value Date   INR 1.1 06/30/2023   APTT  Lab Results  Component Value Date   APTT 30 06/30/2023   Prior MRI from June: Diffuse bilateral cortical atrophy, worse in the parietal regions. Moderate to severe chronic microvascular ischemic changes. Chronic hemosiderin deposition cortically in left parietal and posterior left frontal region. Many small remote infarcts.   CT head:   1. No acute intracranial abnormality. 2. Multiple remote infarcts in the cerebellum, parietal lobes, and bilateral thalami. 3. Chronic small vessel disease. 4. Generalized parenchymal volume loss.  MRI Brain (08/16/23): 1. No acute findings. 2. Moderate generalized cerebral volume loss and extensive cerebral white matter disease. 3. Chronic encephalomalacia changes within the left occipital lobe. 4. Chronic small focal cortical infarcts within the frontal lobes bilaterally. 5. Chronic lacunar infarcts within the thalami and bilateral cerebellar hemispheres. 6. Hemosiderin staining within the left parietal lobe.  Assessment  Jacqueline Bonilla is a 68 y.o. 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  - Exam today reveals patient following commands, sedated, left side appears weaker than right. 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  EEG was disconnected on 08/15/2023 at 1206 as patient was being transferred.  Subsequently when EEG was reconnected at 1241, status epilepticus had resolved.  EEG was then suggestive of cortical dysfunction in left hemisphere likely secondary to underlying structural abnormality, postictal state.  Additionally there was moderate diffuse encephalopathy.  - Imaging results as above - Valproic acid  level came back  subtherapeutic at < 10. This suggests noncompliance.    Recommendations  - Continue LTM EEG until off propofol  - Continue Keppra  at 1000 mg IV BID  - Inpatient seizure precautions - Neurology will continue to follow  I personally spent a total of 40 minutes in the care of this critically ill patient today including performing a medically appropriate exam/evaluation, documenting clinical information in the EHR, and independently interpreting results.    ______________________________________________________________________   Bonney SHARK, Creig Landin, MD [facility]

## 2023-08-17 ENCOUNTER — Inpatient Hospital Stay (HOSPITAL_COMMUNITY)

## 2023-08-17 DIAGNOSIS — T426X6A Underdosing of other antiepileptic and sedative-hypnotic drugs, initial encounter: Secondary | ICD-10-CM

## 2023-08-17 DIAGNOSIS — Z91148 Patient's other noncompliance with medication regimen for other reason: Secondary | ICD-10-CM

## 2023-08-17 DIAGNOSIS — J9601 Acute respiratory failure with hypoxia: Secondary | ICD-10-CM | POA: Diagnosis not present

## 2023-08-17 DIAGNOSIS — R569 Unspecified convulsions: Secondary | ICD-10-CM | POA: Diagnosis not present

## 2023-08-17 DIAGNOSIS — G40909 Epilepsy, unspecified, not intractable, without status epilepticus: Secondary | ICD-10-CM | POA: Diagnosis not present

## 2023-08-17 DIAGNOSIS — I10 Essential (primary) hypertension: Secondary | ICD-10-CM | POA: Diagnosis not present

## 2023-08-17 LAB — MAGNESIUM: Magnesium: 1.9 mg/dL (ref 1.7–2.4)

## 2023-08-17 LAB — GLUCOSE, CAPILLARY
Glucose-Capillary: 156 mg/dL — ABNORMAL HIGH (ref 70–99)
Glucose-Capillary: 104 mg/dL — ABNORMAL HIGH (ref 70–99)
Glucose-Capillary: 114 mg/dL — ABNORMAL HIGH (ref 70–99)
Glucose-Capillary: 138 mg/dL — ABNORMAL HIGH (ref 70–99)

## 2023-08-17 LAB — CBC
HCT: 35.4 % — ABNORMAL LOW (ref 36.0–46.0)
Hemoglobin: 11.6 g/dL — ABNORMAL LOW (ref 12.0–15.0)
MCH: 29.6 pg (ref 26.0–34.0)
MCHC: 32.8 g/dL (ref 30.0–36.0)
MCV: 90.3 fL (ref 80.0–100.0)
Platelets: 205 K/uL (ref 150–400)
RBC: 3.92 MIL/uL (ref 3.87–5.11)
RDW: 14.4 % (ref 11.5–15.5)
WBC: 7.7 K/uL (ref 4.0–10.5)
nRBC: 0 % (ref 0.0–0.2)

## 2023-08-17 LAB — RENAL FUNCTION PANEL
Albumin: 3.1 g/dL — ABNORMAL LOW (ref 3.5–5.0)
Anion gap: 11 (ref 5–15)
BUN: 14 mg/dL (ref 8–23)
CO2: 20 mmol/L — ABNORMAL LOW (ref 22–32)
Calcium: 8.8 mg/dL — ABNORMAL LOW (ref 8.9–10.3)
Chloride: 101 mmol/L (ref 98–111)
Creatinine, Ser: 0.79 mg/dL (ref 0.44–1.00)
GFR, Estimated: >60 mL/min (ref >60)
Glucose, Bld: 173 mg/dL — ABNORMAL HIGH (ref 70–99)
Phosphorus: 4 mg/dL (ref 2.5–4.6)
Potassium: 3.8 mmol/L (ref 3.5–5.1)
Sodium: 132 mmol/L — ABNORMAL LOW (ref 135–145)

## 2023-08-17 LAB — TRIGLYCERIDES: Triglycerides: 87 mg/dL (ref <150)

## 2023-08-17 LAB — PHOSPHORUS: Phosphorus: 4 mg/dL (ref 2.5–4.6)

## 2023-08-17 MED ORDER — ASPIRIN 81 MG PO CHEW
81.0000 mg | CHEWABLE_TABLET | Freq: Every day | ORAL | Status: DC
  Administered 2023-08-18 – 2023-08-26 (×9): 81 mg via ORAL
  Filled 2023-08-17 (×12): qty 1

## 2023-08-17 MED ORDER — ACETAMINOPHEN 325 MG PO TABS
650.0000 mg | ORAL_TABLET | Freq: Four times a day (QID) | ORAL | Status: DC | PRN
  Administered 2023-08-17: 650 mg
  Filled 2023-08-17: qty 2

## 2023-08-17 MED ORDER — INSULIN ASPART 100 UNIT/ML IJ SOLN
0.0000 [IU] | Freq: Two times a day (BID) | INTRAMUSCULAR | Status: DC
  Administered 2023-08-20: 3 [IU] via SUBCUTANEOUS
  Administered 2023-08-21 (×2): 1 [IU] via SUBCUTANEOUS

## 2023-08-17 MED ORDER — THIAMINE MONONITRATE 100 MG PO TABS
100.0000 mg | ORAL_TABLET | ORAL | Status: DC
  Administered 2023-08-18: 100 mg
  Filled 2023-08-17: qty 1

## 2023-08-17 MED ORDER — ATORVASTATIN CALCIUM 40 MG PO TABS
40.0000 mg | ORAL_TABLET | Freq: Every day | ORAL | Status: DC
  Administered 2023-08-17 – 2023-08-26 (×10): 40 mg via ORAL
  Filled 2023-08-17 (×10): qty 1

## 2023-08-17 MED ORDER — FAMOTIDINE 20 MG PO TABS: 20.0000 mg | ORAL_TABLET | Freq: Two times a day (BID) | ORAL | Status: DC

## 2023-08-17 MED ORDER — ORAL CARE MOUTH RINSE: 15.0000 mL | OROMUCOSAL | Status: DC | PRN

## 2023-08-17 MED ORDER — DOCUSATE SODIUM 50 MG/5ML PO LIQD: 100.0000 mg | Freq: Two times a day (BID) | ORAL | Status: DC | PRN

## 2023-08-17 MED ORDER — ADULT MULTIVITAMIN W/MINERALS CH
1.0000 | ORAL_TABLET | Freq: Every day | ORAL | Status: DC
  Administered 2023-08-18 – 2023-08-26 (×9): 1 via ORAL
  Filled 2023-08-17 (×9): qty 1

## 2023-08-17 MED ORDER — MUPIROCIN 2 % EX OINT
TOPICAL_OINTMENT | Freq: Two times a day (BID) | CUTANEOUS | Status: DC
  Administered 2023-08-21 – 2023-08-25 (×4): 1 via TOPICAL
  Filled 2023-08-17 (×6): qty 22

## 2023-08-17 MED ORDER — ACETAMINOPHEN 325 MG PO TABS
650.0000 mg | ORAL_TABLET | Freq: Four times a day (QID) | ORAL | Status: DC | PRN
  Administered 2023-08-17: 650 mg via ORAL
  Filled 2023-08-17: qty 2

## 2023-08-17 MED ORDER — THIAMINE HCL 100 MG/ML IJ SOLN
200.0000 mg | INTRAVENOUS | Status: DC
  Administered 2023-08-17: 200 mg via INTRAVENOUS
  Filled 2023-08-17 (×2): qty 2

## 2023-08-17 MED ORDER — POLYETHYLENE GLYCOL 3350 17 G PO PACK: 17.0000 g | PACK | Freq: Every day | ORAL | Status: DC | PRN

## 2023-08-17 NOTE — Procedures (Signed)
 Extubation Procedure Note  Patient Details:   Name: Jacqueline Bonilla DOB: 1955/04/12 MRN: [id number]   Airway Documentation:    Vent end date: 08/17/23 Vent end time: 0905   Evaluation  O2 sats: stable throughout Complications: No apparent complications Patient did tolerate procedure well. Bilateral Breath Sounds: Clear   Yes  Pt extubated per order to 4L [place]. Sats 98%. Pt had a positive cuff leak, able to state name, no stridor to be noted with a good productive cough.   Shan LITTIE Collum 08/17/2023, 9:05 AM

## 2023-08-17 NOTE — Progress Notes (Signed)
 eLink Physician-Brief Progress Note Patient Name: Jacqueline Bonilla DOB: 06/08/1955 MRN: [id number]   Date of Service  08/17/2023  HPI/Events of Note  Fever noted  eICU Interventions  Tylenol  and blood culture ordered     Intervention Category Intermediate Interventions: Infection - evaluation and management  CLAUDENE AGENT, P 08/17/2023, 12:29 AM

## 2023-08-17 NOTE — Progress Notes (Signed)
 NAME:  Jacqueline Bonilla, MRN:  [id number], DOB:  April 14, 1955, LOS: 2 ADMISSION DATE:  08/15/2023, CONSULTATION DATE:  08/16/2023 REFERRING MD:  EDP , CHIEF COMPLAINT:  Stroke    History of Present Illness:  Jacqueline Bonilla is a 68 y.o. 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  Pertinent  Medical History   Past Medical History:  Diagnosis Date   Anemia    Anxiety    Depression    Dysmenorrhea    Endometriosis    Fibroid    Hypertension    Implantable loop recorder present 2021   Microhematuria    negative workup   Osteoporosis    Tachycardia    Thrombocytopenia (HCC)      Significant Hospital Events: Including procedures, antibiotic start and stop dates in addition to other pertinent events   8/14 : Admitted 8/14: Intubated   Interim History / Subjective:  Spikes a fever of 100.9 and blood cultures ordered   Objective   Blood pressure 104/64, pulse 91, temperature 98.4 F (36.9 C), temperature source Axillary, resp. rate 16, height 5' 4 (1.626 m), weight 54.4 kg, last menstrual period 01/02/2003, SpO2 100%.    Vent Mode: PRVC FiO2 (%):  [40 %] 40 % Set Rate:  [16 bmp] 16 bmp Vt Set:  [430 mL] 430 mL PEEP:  [5 cmH20] 5 cmH20 Plateau Pressure:  [12 cmH20-14 cmH20] 12 cmH20   Intake/Output Summary (Last 24 hours) at 08/17/2023 0715 Last data filed at 08/17/2023 0600 Gross per 24 hour  Intake 631.78 ml  Output 850 ml  Net -218.22 ml   Filed Weights   08/15/23 1300 08/16/23 0235 08/17/23 0500  Weight: 52.4 kg 51.3 kg 54.4 kg    Examination: General: chronically ill intubated HEENT: ETT minimal secretions Cardiovascular: RRR Lungs: clear, passive on vent Abdomen: soft,  hypoactive Musculoskeletal: arthritic changes Skin: no rashes  No cultures   MRI Brain IMPRESSION: 1. No acute findings. 2. Moderate generalized cerebral volume loss and extensive cerebral white matter disease. 3. Chronic encephalomalacia changes within the left occipital lobe. 4. Chronic small focal cortical infarcts within the frontal lobes bilaterally. 5. Chronic lacunar infarcts within the thalami and bilateral cerebellar hemispheres. 6. 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 - Wean as tolerated  - SBT  - Bronchial hygiene. - No indication for antibiotics    Hx HTN. -On home Losartan   - BP is soft - Will hold home med for now   Hypernatremia - Mild at 133, likely due to NPO status - Will continue to monitor   Hyperglycemia in the absence of diabetes  - Blood glucose of 102   Hx depression, anxiety - Supportive care - SSI   DISPO : ICU appropriate     Best Practice (right click and Reselect all SmartList Selections daily)  Diet/type: NPO DVT prophylaxis: prophylactic heparin   GI prophylaxis: PPI Lines: N/A Foley:  Yes, and it is no longer needed Code Status:  full code Last date of multidisciplinary goals of care discussion [none yet]  Labs   CBC: Recent Labs  Lab 08/15/23 1020 08/15/23 1025 08/15/23 1219 08/15/23 1348 08/16/23 0258 08/17/23 0235  WBC 7.1  --   --  5.8 5.9 7.7  NEUTROABS 4.6  --   --   --   --   --   HGB 13.3 13.9 12.6 13.8 11.6* 11.6*  HCT 41.0 41.0 37.0 42.8 35.4* 35.4*  MCV 91.3  --   --  91.8 88.7 90.3  PLT 212  --   --  191 217 205    Basic  Metabolic Panel: Recent Labs  Lab 08/15/23 1020 08/15/23 1025 08/15/23 1219 08/16/23 0258 08/17/23 0235  NA 137 140 136 133* 132*  K 4.0 4.0 4.1 3.5 3.8  CL 103 102  --  100 101  CO2 21*  --   --  21* 20*  GLUCOSE 133* 134*  --  122* 173*  BUN 16 19  --  13 14  CREATININE 0.91 0.90  --  0.80 0.79  CALCIUM  9.4  --   --  8.9 8.8*  MG 1.9  --   --  2.0 1.9  PHOS  --   --   --  3.7 4.0  4.0   GFR: Estimated Creatinine Clearance: 56.2 mL/min (by C-G formula based on SCr of 0.79 mg/dL). Recent Labs  Lab 08/15/23 1020 08/15/23 1348 08/16/23 0258 08/17/23 0235  WBC 7.1 5.8 5.9 7.7  LATICACIDVEN  --  1.7  --   --     Liver Function Tests: Recent Labs  Lab 08/15/23 1020 08/17/23 0235  AST 23  --   ALT 13  --   ALKPHOS 53  --   BILITOT 0.4  --   PROT 7.8  --   ALBUMIN 3.7 3.1*   No results for input(s): LIPASE, AMYLASE in the last 168 hours. Recent Labs  Lab 08/16/23 0258  AMMONIA 21    ABG    Component Value Date/Time   PHART 7.430 08/15/2023 1219   PCO2ART 41.3 08/15/2023 1219   PO2ART 360 (H) 08/15/2023 1219   HCO3 27.4 08/15/2023 1219   TCO2 29 08/15/2023 1219   O2SAT 100 08/15/2023 1219     Coagulation Profile: No results for input(s): INR, PROTIME in the last 168 hours.  Cardiac Enzymes: Recent Labs  Lab 08/15/23 1348  CKTOTAL 79    HbA1C: Hgb A1c MFr Bld  Date/Time Value Ref Range Status  07/01/2023 05:32 AM 5.5 4.8 - 5.6 % Final    Comment:    (NOTE) Diagnosis of Diabetes The following HbA1c ranges recommended by the American Diabetes Association (ADA) may be used as an aid in the diagnosis of diabetes mellitus.  Hemoglobin             Suggested A1C NGSP%              Diagnosis  <5.7                   Non Diabetic  5.7-6.4                Pre-Diabetic  >6.4                   Diabetic  <7.0  Glycemic control for                       adults with diabetes.    02/19/2023 02:07 PM 5.7 (H) 4.8 - 5.6 %  Final    Comment:             Prediabetes: 5.7 - 6.4          Diabetes: >6.4          Glycemic control for adults with diabetes: <7.0     CBG: Recent Labs  Lab 08/16/23 1115 08/16/23 1535 08/16/23 1916 08/16/23 2326 08/17/23 0320  GLUCAP 110* 99 85 141* 156*    Review of Systems:   Unable to assess   Past Medical History:  She,  has a past medical history of Anemia, Anxiety, Depression, Dysmenorrhea, Endometriosis, Fibroid, Hypertension, Implantable loop recorder present (2021), Microhematuria, Osteoporosis, Tachycardia, and Thrombocytopenia (HCC).   Surgical History:   Past Surgical History:  Procedure Laterality Date   BREAST BIOPSY     BUBBLE STUDY  12/08/2020   Procedure: BUBBLE STUDY;  Surgeon: Delford Maude BROCKS, MD;  Location: [facility] ENDOSCOPY;  Service: Cardiovascular;;   CESAREAN SECTION     hysteroscopic resection     implantable loop recorder implant  10/21/2019   Medtronic Reveal Linq model LNQ 22 (SN [id number]) implantable loop recorder   IR CT HEAD LTD  12/01/2020   IR PERCUTANEOUS ART THROMBECTOMY/INFUSION INTRACRANIAL INC DIAG ANGIO  12/01/2020   IR US  GUIDE VASC ACCESS RIGHT  12/01/2020   ORIF HUMERUS FRACTURE Left 07/31/2021   Procedure: OPEN REDUCTION INTERNAL FIXATION (ORIF) DISTAL HUMERUS FRACTURE;  Surgeon: Kendal Franky SQUIBB, MD;  Location: [facility] OR;  Service: Orthopedics;  Laterality: Left;   RADIOLOGY WITH ANESTHESIA N/A 12/01/2020   Procedure: IR WITH ANESTHESIA - CODE STROKE;  Surgeon: Radiologist, Medication, MD;  Location: [facility] OR;  Service: Radiology;  Laterality: N/A;   TEE WITHOUT CARDIOVERSION N/A 12/08/2020   Procedure: TRANSESOPHAGEAL ECHOCARDIOGRAM (TEE);  Surgeon: Delford Maude BROCKS, MD;  Location: [facility] ENDOSCOPY;  Service: Cardiovascular;  Laterality: N/A;     Social History:   reports that she has never smoked. She has never used smokeless tobacco. She reports that she does not drink alcohol and does not use drugs.   Family History:  Her family history  includes Cancer in her father; Dementia in her mother; Heart attack in her maternal grandfather; Polymyositis in her sister.   Allergies Allergies  Allergen Reactions   Iodinated Contrast Media Hives   Iohexol  Hives   Plavix  [Clopidogrel ] Diarrhea   Quinolones Hives    Reaction with Cipro, Levaquin, Avelox   Shellfish Allergy Itching     Home Medications  Prior to Admission medications   Medication Sig Start Date End Date Taking? Authorizing Provider  aspirin  81 MG chewable tablet Chew 1 tablet (81 mg total) by mouth daily. 04/02/23  Yes Shafer, Jorene, NP  atorvastatin  (LIPITOR) 40 MG tablet Take 1 tablet (40 mg total) by mouth daily. 05/29/23  Yes Purcell Emil Schanz, MD  CINNAMON  PO Take 1 capsule by mouth daily.   Yes [provider]  Coenzyme Q10 (CO Q 10 PO) Take 1 capsule by mouth daily.   Yes [provider]  divalproex  (DEPAKOTE  ER) 500 MG 24 hr tablet Take 1 tablet (500 mg total) by mouth daily. 07/16/23 07/15/24 Yes Sagardia, Miguel Jose, MD  Ferrous Sulfate (IRON PO) Take 1 tablet by mouth daily.   Yes [provider]  losartan  (COZAAR ) 25 MG tablet Take 0.5 tablets (12.5 mg total) by mouth daily. 04/08/23  Yes Angiulli, Toribio PARAS, PA-C  MAGNESIUM  PO Take 1 tablet by mouth at bedtime.   Yes [provider]  Multiple Vitamin (MULTIVITAMIN WITH MINERALS) TABS tablet Take 1 tablet by mouth daily. Liposomal multivitamin/minerals   Yes [provider]  OVER THE COUNTER MEDICATION Take 1 tablet by mouth daily. OTC vitamin C + quercetin   Yes [provider]  Study - LIBREXIA-STROKE - milvexian 25 mg or placebo tablet (PI-Sethi) Take 1 tablet by mouth 2 (two) times daily. 06/26/23  Yes Rosemarie Eather RAMAN, MD      Drue Grow MD [facility] Medicine Program - PGY-2 08/17/2023, 7:15 AM Pager# [phone]

## 2023-08-17 NOTE — Progress Notes (Signed)
 NEUROLOGY CONSULT FOLLOW UP NOTE   Date of service: August 17, 2023 Patient Name: Jacqueline Bonilla MRN:  [id number] DOB:  [birth year]  Interval Hx/subjective  Now awake and extubated.   Vitals   Vitals:   08/17/23 1000 08/17/23 1013 08/17/23 1100 08/17/23 1200  BP: 130/82  137/87 136/84  Pulse:    (!) 121  Resp: 15  17 (!) 21  Temp:  99.7 F (37.6 C)    TempSrc:  Oral    SpO2:    100%  Weight:      Height:         Body mass index is 20.59 kg/m.  Physical Exam   Constitutional: Appears well-developed and well-nourished.  Psych: Dysthymic, fatigued affect is appropriate to situation.  Eyes: No scleral injection.  HENT: Voice is hoarse following extubation Head: Normocephalic.  Respiratory: Effort normal, non-labored breathing.    Neurologic Examination   Ment: Awake with mildly decreased level of alertness and increased latencies of verbal and motor responses. She is able to answer questions and follow all commands. CN: PERRL, EOMI, fixates and tracks normally and makes eye contact. Face symmetric. Hoarse phonation.  Motor: Moves all extremities against gravity.  Sensory: Intact to FT x 4 Reflexes: Unremarkable Cerebellar: No ataxia with FNF Gait: Deferred  Medications  Current Facility-Administered Medications:    acetaminophen  (TYLENOL ) tablet 650 mg, 650 mg, Oral, Q6H PRN, Alghanim, Fahid, MD   [START ON 08/18/2023] aspirin  chewable tablet 81 mg, 81 mg, Oral, Daily, Alghanim, Fahid, MD   Chlorhexidine  Gluconate Cloth 2 % PADS 6 each, 6 each, Topical, Daily, Gleason, Leita SAUNDERS, PA-C, 6 each at 08/17/23 0322   docusate (COLACE) 50 MG/5ML liquid 100 mg, 100 mg, Oral, BID PRN, Alghanim, Fahid, MD   enoxaparin  (LOVENOX ) injection 40 mg, 40 mg, Subcutaneous, Q24H, Alghanim, Fahid, MD, 40 mg at 08/16/23 1430   famotidine  (PEPCID ) tablet 20 mg, 20 mg, Oral, BID, Alghanim, Fahid, MD   feeding supplement (OSMOLITE 1.2 CAL) liquid 1,000 mL, 1,000 mL, Per Tube, Continuous,  Alghanim, Fahid, MD, Paused at 08/17/23 0839   insulin  aspart (novoLOG ) injection 0-9 Units, 0-9 Units, Subcutaneous, Q4H, Claudene Toribio BROCKS, MD, 2 Units at 08/17/23 0321   levETIRAcetam  (KEPPRA ) undiluted injection 1,000 mg, 1,000 mg, Intravenous, Q12H, Hubbard Seldon, MD, 1,000 mg at 08/17/23 0907   [START ON 08/18/2023] multivitamin with minerals tablet 1 tablet, 1 tablet, Oral, Daily, Alghanim, Fahid, MD   Oral care mouth rinse, 15 mL, Mouth Rinse, Q2H, Claudene Toribio BROCKS, MD, 15 mL at 08/17/23 1158   Oral care mouth rinse, 15 mL, Mouth Rinse, PRN, Claudene Toribio BROCKS, MD   polyethylene glycol (MIRALAX  / GLYCOLAX ) packet 17 g, 17 g, Oral, Daily PRN, Alghanim, Fahid, MD   thiamine  (VITAMIN B1) 200 mg in sodium chloride  0.9 % 50 mL IVPB, 200 mg, Intravenous, Q24H, Stopped at 08/17/23 1050 **OR** thiamine  (VITAMIN B1) tablet 100 mg, 100 mg, Per Tube, Q24H, Amoako, Prince, MD  Labs and Diagnostic Imaging   CBC:  Recent Labs  Lab 08/15/23 1020 08/15/23 1025 08/16/23 0258 08/17/23 0235  WBC 7.1   < > 5.9 7.7  NEUTROABS 4.6  --   --   --   HGB 13.3   < > 11.6* 11.6*  HCT 41.0   < > 35.4* 35.4*  MCV 91.3   < > 88.7 90.3  PLT 212   < > 217 205   < > = values in this interval not displayed.    Basic Metabolic  Panel:  Lab Results  Component Value Date   NA 132 (L) 08/17/2023   K 3.8 08/17/2023   CO2 20 (L) 08/17/2023   GLUCOSE 173 (H) 08/17/2023   BUN 14 08/17/2023   CREATININE 0.79 08/17/2023   CALCIUM  8.8 (L) 08/17/2023   GFRNONAA >60 08/17/2023   GFRAA 78 07/20/2019   Lipid Panel:  Lab Results  Component Value Date   LDLCALC 99 07/01/2023   HgbA1c:  Lab Results  Component Value Date   HGBA1C 5.5 07/01/2023   Urine Drug Screen:     Component Value Date/Time   LABOPIA NONE DETECTED 08/15/2023 1024   COCAINSCRNUR NONE DETECTED 08/15/2023 1024   LABBENZ POSITIVE (A) 08/15/2023 1024   AMPHETMU NONE DETECTED 08/15/2023 1024   THCU NONE DETECTED 08/15/2023 1024   LABBARB NONE  DETECTED 08/15/2023 1024    Alcohol Level     Component Value Date/Time   ETH <15 08/15/2023 1020   INR  Lab Results  Component Value Date   INR 1.1 06/30/2023   APTT  Lab Results  Component Value Date   APTT 30 06/30/2023   Prior MRI from June: Diffuse bilateral cortical atrophy, worse in the parietal regions. Moderate to severe chronic microvascular ischemic changes. Chronic hemosiderin deposition cortically in left parietal and posterior left frontal region. Many small remote infarcts.    CT head:   1. No acute intracranial abnormality. 2. Multiple remote infarcts in the cerebellum, parietal lobes, and bilateral thalami. 3. Chronic small vessel disease. 4. Generalized parenchymal volume loss.   MRI Brain (08/16/23): 1. No acute findings. 2. Moderate generalized cerebral volume loss and extensive cerebral white matter disease. 3. Chronic encephalomalacia changes within the left occipital lobe. 4. Chronic small focal cortical infarcts within the frontal lobes bilaterally. 5. Chronic lacunar infarcts within the thalami and bilateral cerebellar hemispheres. 6. Hemosiderin staining within the left parietal lobe.  Assessment  68 y.o. female with a PMHx of HTN, HLD, vascular dementia, left MCA CVA with right upper extremity hemiplegia, recent evaluation in June 2025 for seizure versus TIA discharged on Depakote  XR 500 mg daily with unclear compliance, seizure history first noted in December of 2022 following left MCA CVA with TNK and thrombectomy with subsequent SAH, previously on Keppra , with EEG findings concerning for cortical dysfunction and epileptogenicity from the left frontal parietal region who presented to the ED on 8/14 with status epilepticus.  - Exam reveals an awake patient with mildly decreased level of alertness and increased latencies of verbal and motor responses. She is able to answer questions and follow all commands. Strength is 4/5 x 4. - LTM EEG report for today  (08/16/2023 1201 to 08/17/2023 1320): Continuous slow, left hemisphere. This study was suggestive of cortical dysfunction in left hemisphere likely secondary to underlying structural abnormality, postictal state. No seizures were noted. - Imaging results as above - Valproic acid  level came back subtherapeutic at < 10. This suggests noncompliance.  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   Recommendations  - Discontinuing LTM EEG - Continue Keppra  at 1000 mg IV BID, can switch to PO as tolerated. Encourage compliance.  - Inpatient and outpatient seizure precautions. No driving until seizure free for 6 months.  - PT/OT - OOB to chair when able.  - Outpatient Neurology follow up.  - Neurohospitalist service will sign off. Please call if there are additional questions.   ______________________________________________________________________   Bonney SHARK, Adonica Fukushima, MD [facility]

## 2023-08-17 NOTE — Consult Note (Addendum)
 WOC Nurse Consult Note: Reason for Consult: medical device related pressure injuries from electrodes to forehead  Wound type: Stage 2 Pressure Injuries forehead r/t electrodes  Pressure Injury POA: no, medical device related  Measurement:see nursing flowsheet  Wound bed: largely red and moist  Drainage (amount, consistency, odor)  Periwound: intact  Dressing procedure/placement/frequency: Cleanse wounds to forehead with NS, apply Mupirocin  ointment 2 times a day and leave open to air or cover with silicone foam whichever is preferred.  Keep pressure off areas.   POC discussed with bedside nurse. WOC team will not follow. Re-consult if further needs arise.   Thank you,    Powell Bar MSN, RN-BC, [employer]

## 2023-08-17 NOTE — Plan of Care (Signed)
  Problem: Education: Goal: Knowledge of General Education information will improve Description: Including pain rating scale, medication(s)/side effects and non-pharmacologic comfort measures Outcome: Progressing   Problem: Health Behavior/Discharge Planning: Goal: Ability to manage health-related needs will improve Outcome: Progressing   Problem: Clinical Measurements: Goal: Ability to maintain clinical measurements within normal limits will improve Outcome: Progressing Goal: Will remain free from infection Outcome: Progressing Goal: Diagnostic test results will improve Outcome: Progressing Goal: Respiratory complications will improve Outcome: Progressing Goal: Cardiovascular complication will be avoided Outcome: Progressing   Problem: Activity: Goal: Risk for activity intolerance will decrease Outcome: Progressing   Problem: Nutrition: Goal: Adequate nutrition will be maintained Outcome: Progressing   Problem: Coping: Goal: Level of anxiety will decrease Outcome: Progressing   Problem: Elimination: Goal: Will not experience complications related to bowel motility Outcome: Progressing Goal: Will not experience complications related to urinary retention Outcome: Progressing   Problem: Pain Managment: Goal: General experience of comfort will improve and/or be controlled Outcome: Progressing   Problem: Safety: Goal: Ability to remain free from injury will improve Outcome: Progressing   Problem: Skin Integrity: Goal: Risk for impaired skin integrity will decrease Outcome: Progressing   Problem: Safety: Goal: Non-violent Restraint(s) Outcome: Progressing   Problem: Education: Goal: Ability to describe self-care measures that may prevent or decrease complications (Diabetes Survival Skills Education) will improve Outcome: Progressing Goal: Individualized Educational Video(s) Outcome: Progressing   Problem: Coping: Goal: Ability to adjust to condition or change  in health will improve Outcome: Progressing   Problem: Fluid Volume: Goal: Ability to maintain a balanced intake and output will improve Outcome: Progressing   Problem: Health Behavior/Discharge Planning: Goal: Ability to identify and utilize available resources and services will improve Outcome: Progressing Goal: Ability to manage health-related needs will improve Outcome: Progressing   Problem: Metabolic: Goal: Ability to maintain appropriate glucose levels will improve Outcome: Progressing   Problem: Nutritional: Goal: Maintenance of adequate nutrition will improve Outcome: Progressing Goal: Progress toward achieving an optimal weight will improve Outcome: Progressing   Problem: Skin Integrity: Goal: Risk for impaired skin integrity will decrease Outcome: Progressing   Problem: Tissue Perfusion: Goal: Adequacy of tissue perfusion will improve Outcome: Progressing

## 2023-08-17 NOTE — Procedures (Addendum)
 Patient Name: Jacqueline Bonilla  MRN: [id number]  Epilepsy Attending: Arlin MALVA Krebs  Referring Physician/Provider: Merrianne Locus, MD  Duration: 08/16/2023 1201 to 08/17/2023 1315   Patient history:  68yo F presented to the ED today with initial symptoms of right facial weakness and speech disturbance with a right forced gaze, oxygen  desaturation, and seizure activity en route to the ED. EEG to evaluate for seizure   Level of alertness:  comatose/lethargic   AEDs during EEG study: LEV, Propofol    Technical aspects: This EEG study was done with scalp electrodes positioned according to the 10-20 International system of electrode placement. Electrical activity was reviewed with band pass filter of 1-70Hz , sensitivity of 7 uV/mm, display speed of 44mm/sec with a 60Hz  notched filter applied as appropriate. EEG data were recorded continuously and digitally stored.  Video monitoring was available and reviewed as appropriate.   Description:  The posterior dominant rhythm consists of 8 Hz activity of moderate voltage (25-35 uV) seen predominantly in posterior head regions, symmetric and reactive to eye opening and eye closing. Sleep was characterized by vertex waves, sleep spindles (12 to 14 Hz), maximal frontocentral region.  EEG showed continuous low amplitude 3-5 hz theta- delta slowing in left hemisphere which gradually improved in amplitude and appeared more intermittent Hyperventilation and photic stimulation were not performed.     EEG was disconnected between 08/16/2023 1421 to 1540 for MRI brain   ABNORMALITY - Continuous slow, left hemisphere   IMPRESSION: This study was suggestive of cortical dysfunction in left hemisphere likely secondary to underlying structural abnormality, postictal state. No seizures were noted   [name]

## 2023-08-17 NOTE — Progress Notes (Addendum)
 LTM EEG disconnected. Skin breakdown at [facility]: F7,F3, C3, T7, Fz, Cz, F8,T8. Nurse notified.

## 2023-08-18 DIAGNOSIS — R569 Unspecified convulsions: Secondary | ICD-10-CM | POA: Diagnosis not present

## 2023-08-18 DIAGNOSIS — I10 Essential (primary) hypertension: Secondary | ICD-10-CM | POA: Diagnosis not present

## 2023-08-18 DIAGNOSIS — J9601 Acute respiratory failure with hypoxia: Secondary | ICD-10-CM | POA: Diagnosis not present

## 2023-08-18 LAB — BASIC METABOLIC PANEL WITH GFR
Anion gap: 10 (ref 5–15)
BUN: 12 mg/dL (ref 8–23)
CO2: 23 mmol/L (ref 22–32)
Calcium: 8.8 mg/dL — ABNORMAL LOW (ref 8.9–10.3)
Chloride: 103 mmol/L (ref 98–111)
Creatinine, Ser: 0.76 mg/dL (ref 0.44–1.00)
GFR, Estimated: >60 mL/min (ref >60)
Glucose, Bld: 113 mg/dL — ABNORMAL HIGH (ref 70–99)
Potassium: 3.8 mmol/L (ref 3.5–5.1)
Sodium: 136 mmol/L (ref 135–145)

## 2023-08-18 LAB — PHOSPHORUS: Phosphorus: 3.6 mg/dL (ref 2.5–4.6)

## 2023-08-18 LAB — GLUCOSE, CAPILLARY
Glucose-Capillary: 101 mg/dL — ABNORMAL HIGH (ref 70–99)
Glucose-Capillary: 94 mg/dL (ref 70–99)

## 2023-08-18 LAB — MAGNESIUM: Magnesium: 1.8 mg/dL (ref 1.7–2.4)

## 2023-08-18 LAB — CBC
HCT: 36 % (ref 36.0–46.0)
Hemoglobin: 11.6 g/dL — ABNORMAL LOW (ref 12.0–15.0)
MCH: 29.4 pg (ref 26.0–34.0)
MCHC: 32.2 g/dL (ref 30.0–36.0)
MCV: 91.4 fL (ref 80.0–100.0)
Platelets: 187 K/uL (ref 150–400)
RBC: 3.94 MIL/uL (ref 3.87–5.11)
RDW: 14.2 % (ref 11.5–15.5)
WBC: 9.3 K/uL (ref 4.0–10.5)
nRBC: 0 % (ref 0.0–0.2)

## 2023-08-18 MED ORDER — LEVETIRACETAM 500 MG PO TABS
1000.0000 mg | ORAL_TABLET | Freq: Two times a day (BID) | ORAL | Status: DC
  Administered 2023-08-18 – 2023-08-26 (×16): 1000 mg via ORAL
  Filled 2023-08-18 (×16): qty 2

## 2023-08-18 MED ORDER — POTASSIUM & SODIUM PHOSPHATES 280-160-250 MG PO PACK
2.0000 | PACK | ORAL | Status: AC
  Administered 2023-08-18 (×4): 2 via ORAL
  Filled 2023-08-18 (×4): qty 2

## 2023-08-18 MED ORDER — THIAMINE HCL 100 MG/ML IJ SOLN: 200.0000 mg | INTRAVENOUS | Status: DC

## 2023-08-18 MED ORDER — THIAMINE MONONITRATE 100 MG PO TABS
100.0000 mg | ORAL_TABLET | ORAL | Status: DC
  Administered 2023-08-19 – 2023-08-26 (×8): 100 mg via ORAL
  Filled 2023-08-18 (×8): qty 1

## 2023-08-18 NOTE — Plan of Care (Signed)
   Problem: Clinical Measurements: Goal: Will remain free from infection Outcome: Progressing   Problem: Clinical Measurements: Goal: Diagnostic test results will improve Outcome: Progressing   Problem: Clinical Measurements: Goal: Respiratory complications will improve Outcome: Progressing   Problem: Clinical Measurements: Goal: Cardiovascular complication will be avoided Outcome: Progressing

## 2023-08-18 NOTE — Progress Notes (Signed)
 Pharmacy Electrolyte Replacement  Recent Labs:  Recent Labs    08/18/23 0435  K 3.8  MG 1.8  PHOS 3.6  CREATININE 0.76    Low Critical Values (K </= 2.5, Phos </= 1, Mg </= 1) Present: None   Plan: give 2 packets potassium & sodium phosphates  (PHOS-NAK) 280-160-250 MG PO. Recheck levels with AM labs.   Vermell Mccallum, PharmD CCM Pharmacy Resident

## 2023-08-18 NOTE — Plan of Care (Signed)
  Problem: Clinical Measurements: Goal: Ability to maintain clinical measurements within normal limits will improve Outcome: Progressing Goal: Respiratory complications will improve Outcome: Progressing   Problem: Clinical Measurements: Goal: Ability to maintain clinical measurements within normal limits will improve Outcome: Progressing Goal: Respiratory complications will improve Outcome: Progressing

## 2023-08-18 NOTE — Progress Notes (Signed)
 NAME:  Jacqueline Bonilla, MRN:  [id number], DOB:  28-Aug-1955, LOS: 3 ADMISSION DATE:  08/15/2023, CONSULTATION DATE:  08/18/23 REFERRING MD:  MELODIE, CHIEF COMPLAINT:  status epilepticus   History of Present Illness:  [name] is a 68 y/o F with a PMH significant for prior strokes with R hemiparesis, vascular dementia who comes in with status epilepticus requiring invasive mechanical ventilation extubated to [place] on 8/16.  Pertinent  Medical History  As above  Significant Hospital Events: Including procedures, antibiotic start and stop dates in addition to other pertinent events   Extubated on 8/16, did well since  Interim History / Subjective:  Patient reports feeling well today. No complaints.  Objective    Blood pressure 104/67, pulse 92, temperature 98.6 F (37 C), temperature source Axillary, resp. rate 18, height 5' 4 (1.626 m), weight 54.4 kg, last menstrual period 01/02/2003, SpO2 100%.        Intake/Output Summary (Last 24 hours) at 08/18/2023 1046 Last data filed at 08/18/2023 0600 Gross per 24 hour  Intake 214.75 ml  Output 1000 ml  Net -785.25 ml   Filed Weights   08/15/23 1300 08/16/23 0235 08/17/23 0500  Weight: 52.4 kg 51.3 kg 54.4 kg    Examination: General: chronically ill appearing, no distress HENT: anicteric sclera, well injected conjunctivae, PERRL, oral and nasal mucosa normal Lungs: CTAB Cardiovascular: RRR, no m/r/g, JVD flat Abdomen: soft, non-tender Extremities: no edema Neuro: alert, awake, oriented x 3, able to move upper and lower extremities antigravity, no focal deficits GU: not obtained.  Resolved problem list   Assessment and Plan   Cardiovascular:    #HTN: - Hold BP medications due to normotension   Pulmonary:   #Acute Hypoxic Respiratory Failure: Intubated for airway protection; extubated on 8/16. - IS - OOB to chair   ID:   #Fever: unclear etiology. Blood cultures obtained and no growth to date. Patient alert, awake,  responsive to commands and has a clear reason for status epilepticus in the setting of medication non-adherence. No risk for meningitis. No need for antimicrobials at this time. - CTM  GI:   #Stress Ulcer PPx: H2 Blocker   #Nutrition: regular diet after bedside swallow     GU/Renal: no issues     Heme/Onc: #VTE ppx: Lovenox  subQ   Endocrine:   #DM: - Sensitive SSI   Neuro:   #Status Epilepticus: likely due to medication non-adherence - Seizure precautions - Continue keppra  1 g BID - MRI Brain negative for acute pathology - Appreciate neuro recommendations   MSK: -PT/OT   Disposition: Transfer to med surg unit.  Best Practice (right click and Reselect all SmartList Selections daily)   Diet/type: Regular consistency (see orders) DVT prophylaxis LMWH Pressure ulcer(s): N/A GI prophylaxis: H2B Lines: N/A Foley:  Yes, and it is no longer needed Code Status:  full code Last date of multidisciplinary goals of care discussion [NA]  Labs   CBC: Recent Labs  Lab 08/15/23 1020 08/15/23 1025 08/15/23 1219 08/15/23 1348 08/16/23 0258 08/17/23 0235 08/18/23 0435  WBC 7.1  --   --  5.8 5.9 7.7 9.3  NEUTROABS 4.6  --   --   --   --   --   --   HGB 13.3   < > 12.6 13.8 11.6* 11.6* 11.6*  HCT 41.0   < > 37.0 42.8 35.4* 35.4* 36.0  MCV 91.3  --   --  91.8 88.7 90.3 91.4  PLT 212  --   --  191 217 205 187   < > = values in this interval not displayed.    Basic Metabolic Panel: Recent Labs  Lab 08/15/23 1020 08/15/23 1025 08/15/23 1219 08/16/23 0258 08/17/23 0235 08/18/23 0435  NA 137 140 136 133* 132* 136  K 4.0 4.0 4.1 3.5 3.8 3.8  CL 103 102  --  100 101 103  CO2 21*  --   --  21* 20* 23  GLUCOSE 133* 134*  --  122* 173* 113*  BUN 16 19  --  13 14 12   CREATININE 0.91 0.90  --  0.80 0.79 0.76  CALCIUM  9.4  --   --  8.9 8.8* 8.8*  MG 1.9  --   --  2.0 1.9 1.8  PHOS  --   --   --  3.7 4.0  4.0 3.6   GFR: Estimated Creatinine Clearance: 56.2 mL/min  (by C-G formula based on SCr of 0.76 mg/dL). Recent Labs  Lab 08/15/23 1348 08/16/23 0258 08/17/23 0235 08/18/23 0435  WBC 5.8 5.9 7.7 9.3  LATICACIDVEN 1.7  --   --   --     Liver Function Tests: Recent Labs  Lab 08/15/23 1020 08/17/23 0235  AST 23  --   ALT 13  --   ALKPHOS 53  --   BILITOT 0.4  --   PROT 7.8  --   ALBUMIN 3.7 3.1*   No results for input(s): LIPASE, AMYLASE in the last 168 hours. Recent Labs  Lab 08/16/23 0258  AMMONIA 21    ABG    Component Value Date/Time   PHART 7.430 08/15/2023 1219   PCO2ART 41.3 08/15/2023 1219   PO2ART 360 (H) 08/15/2023 1219   HCO3 27.4 08/15/2023 1219   TCO2 29 08/15/2023 1219   O2SAT 100 08/15/2023 1219     Coagulation Profile: No results for input(s): INR, PROTIME in the last 168 hours.  Cardiac Enzymes: Recent Labs  Lab 08/15/23 1348  CKTOTAL 79    HbA1C: Hgb A1c MFr Bld  Date/Time Value Ref Range Status  07/01/2023 05:32 AM 5.5 4.8 - 5.6 % Final    Comment:    (NOTE) Diagnosis of Diabetes The following HbA1c ranges recommended by the American Diabetes Association (ADA) may be used as an aid in the diagnosis of diabetes mellitus.  Hemoglobin             Suggested A1C NGSP%              Diagnosis  <5.7                   Non Diabetic  5.7-6.4                Pre-Diabetic  >6.4                   Diabetic  <7.0                   Glycemic control for                       adults with diabetes.    02/19/2023 02:07 PM 5.7 (H) 4.8 - 5.6 % Final    Comment:             Prediabetes: 5.7 - 6.4          Diabetes: >6.4          Glycemic control for adults with diabetes: <7.0  CBG: Recent Labs  Lab 08/17/23 0320 08/17/23 0727 08/17/23 1100 08/17/23 1544 08/18/23 0716  GLUCAP 156* 104* 114* 138* 101*    Review of Systems:   Not obtained.  Past Medical History:  She,  has a past medical history of Anemia, Anxiety, Depression, Dysmenorrhea, Endometriosis, Fibroid, Hypertension,  Implantable loop recorder present (2021), Microhematuria, Osteoporosis, Tachycardia, and Thrombocytopenia (HCC).   Surgical History:   Past Surgical History:  Procedure Laterality Date   BREAST BIOPSY     BUBBLE STUDY  12/08/2020   Procedure: BUBBLE STUDY;  Surgeon: Delford Maude BROCKS, MD;  Location: [facility] ENDOSCOPY;  Service: Cardiovascular;;   CESAREAN SECTION     hysteroscopic resection     implantable loop recorder implant  10/21/2019   Medtronic Reveal Linq model LNQ 22 (SN [id number]) implantable loop recorder   IR CT HEAD LTD  12/01/2020   IR PERCUTANEOUS ART THROMBECTOMY/INFUSION INTRACRANIAL INC DIAG ANGIO  12/01/2020   IR US  GUIDE VASC ACCESS RIGHT  12/01/2020   ORIF HUMERUS FRACTURE Left 07/31/2021   Procedure: OPEN REDUCTION INTERNAL FIXATION (ORIF) DISTAL HUMERUS FRACTURE;  Surgeon: Kendal Franky SQUIBB, MD;  Location: [facility] OR;  Service: Orthopedics;  Laterality: Left;   RADIOLOGY WITH ANESTHESIA N/A 12/01/2020   Procedure: IR WITH ANESTHESIA - CODE STROKE;  Surgeon: Radiologist, Medication, MD;  Location: [facility] OR;  Service: Radiology;  Laterality: N/A;   TEE WITHOUT CARDIOVERSION N/A 12/08/2020   Procedure: TRANSESOPHAGEAL ECHOCARDIOGRAM (TEE);  Surgeon: Delford Maude BROCKS, MD;  Location: [facility] ENDOSCOPY;  Service: Cardiovascular;  Laterality: N/A;     Social History:   reports that she has never smoked. She has never used smokeless tobacco. She reports that she does not drink alcohol and does not use drugs.   Family History:  Her family history includes Cancer in her father; Dementia in her mother; Heart attack in her maternal grandfather; Polymyositis in her sister.   Allergies Allergies  Allergen Reactions   Iodinated Contrast Media Hives   Iohexol  Hives   Plavix  [Clopidogrel ] Diarrhea   Quinolones Hives    Reaction with Cipro, Levaquin, Avelox   Shellfish Allergy Itching     Home Medications  Prior to Admission medications   Medication Sig Start Date End Date Taking? Authorizing  Provider  aspirin  81 MG chewable tablet Chew 1 tablet (81 mg total) by mouth daily. 04/02/23  Yes Shafer, Jorene, NP  atorvastatin  (LIPITOR) 40 MG tablet Take 1 tablet (40 mg total) by mouth daily. 05/29/23  Yes Purcell Emil Schanz, MD  CINNAMON  PO Take 1 capsule by mouth daily.   Yes [provider]  Coenzyme Q10 (CO Q 10 PO) Take 1 capsule by mouth daily.   Yes [provider]  divalproex  (DEPAKOTE  ER) 500 MG 24 hr tablet Take 1 tablet (500 mg total) by mouth daily. 07/16/23 07/15/24 Yes Sagardia, Miguel Jose, MD  Ferrous Sulfate (IRON PO) Take 1 tablet by mouth daily.   Yes [provider]  losartan  (COZAAR ) 25 MG tablet Take 0.5 tablets (12.5 mg total) by mouth daily. 04/08/23  Yes Angiulli, Toribio PARAS, PA-C  MAGNESIUM  PO Take 1 tablet by mouth at bedtime.   Yes [provider]  Multiple Vitamin (MULTIVITAMIN WITH MINERALS) TABS tablet Take 1 tablet by mouth daily. Liposomal multivitamin/minerals   Yes [provider]  OVER THE COUNTER MEDICATION Take 1 tablet by mouth daily. OTC vitamin C + quercetin   Yes [provider]  Study - LIBREXIA-STROKE - milvexian 25 mg or placebo tablet (PI-Sethi) Take  1 tablet by mouth 2 (two) times daily. 06/26/23  Yes Rosemarie Eather RAMAN, MD     I have spent 35 minutes evaluating patient, reviewing chart, and discussing plan of care with patient, family, pharmacist on round and primary medical team.  Paula Southerly, MD [facility]   Paula Southerly, MD [facility]

## 2023-08-19 DIAGNOSIS — R509 Fever, unspecified: Secondary | ICD-10-CM | POA: Insufficient documentation

## 2023-08-19 DIAGNOSIS — G40901 Epilepsy, unspecified, not intractable, with status epilepticus: Principal | ICD-10-CM | POA: Insufficient documentation

## 2023-08-19 DIAGNOSIS — L8990 Pressure ulcer of unspecified site, unspecified stage: Secondary | ICD-10-CM | POA: Insufficient documentation

## 2023-08-19 DIAGNOSIS — R569 Unspecified convulsions: Secondary | ICD-10-CM | POA: Diagnosis not present

## 2023-08-19 LAB — CBC
HCT: 33.7 % — ABNORMAL LOW (ref 36.0–46.0)
Hemoglobin: 10.9 g/dL — ABNORMAL LOW (ref 12.0–15.0)
MCH: 29.5 pg (ref 26.0–34.0)
MCHC: 32.3 g/dL (ref 30.0–36.0)
MCV: 91.1 fL (ref 80.0–100.0)
Platelets: 207 K/uL (ref 150–400)
RBC: 3.7 MIL/uL — ABNORMAL LOW (ref 3.87–5.11)
RDW: 14.1 % (ref 11.5–15.5)
WBC: 7.3 K/uL (ref 4.0–10.5)
nRBC: 0 % (ref 0.0–0.2)

## 2023-08-19 LAB — MAGNESIUM: Magnesium: 1.7 mg/dL (ref 1.7–2.4)

## 2023-08-19 LAB — BASIC METABOLIC PANEL WITH GFR
Anion gap: 9 (ref 5–15)
BUN: 16 mg/dL (ref 8–23)
CO2: 24 mmol/L (ref 22–32)
Calcium: 8.8 mg/dL — ABNORMAL LOW (ref 8.9–10.3)
Chloride: 103 mmol/L (ref 98–111)
Creatinine, Ser: 0.71 mg/dL (ref 0.44–1.00)
GFR, Estimated: >60 mL/min (ref >60)
Glucose, Bld: 103 mg/dL — ABNORMAL HIGH (ref 70–99)
Potassium: 4 mmol/L (ref 3.5–5.1)
Sodium: 136 mmol/L (ref 135–145)

## 2023-08-19 LAB — GLUCOSE, CAPILLARY: Glucose-Capillary: 94 mg/dL (ref 70–99)

## 2023-08-19 LAB — PHOSPHORUS: Phosphorus: 3.7 mg/dL (ref 2.5–4.6)

## 2023-08-19 NOTE — Evaluation (Signed)
 Occupational Therapy Evaluation Patient Details Name: Jacqueline Bonilla MRN: [id number] DOB: [birth year] Today's Date: 08/19/2023   History of Present Illness   Patient is a 68 year old female coming in initially as code stroke as family noted right facial droop, slurred speech, found to have status epilepticus. Was intubated and extubated on 08/17/23. PMH: CVA with right hemiparesis, vascular dementia.     Clinical Impressions Patient admitted for the diagnosis above.  PTA she lives at home with her spouse, and did need ADL support, as well as iADL support.  Patient did walk household distances without an AD.  Currently she presents with mild confusion, instability, increased tone to her upper extremities, all combining to impact her functional independence.  She is needing up to Mod A for self feeding, upper body ADL, and closer to Max A for lower body ADL.  OT will continue efforts in the acute setting to address deficits, and Patient will benefit from intensive inpatient follow-up therapy, >3 hours/day.     If plan is discharge home, recommend the following:   Assist for transportation;Assistance with cooking/housework;A lot of help with walking and/or transfers;A lot of help with bathing/dressing/bathroom;Direct supervision/assist for medications management     Functional Status Assessment   Patient has had a recent decline in their functional status and demonstrates the ability to make significant improvements in function in a reasonable and predictable amount of time.     Equipment Recommendations   None recommended by OT     Recommendations for Other Services         Precautions/Restrictions   Precautions Precautions: Fall Recall of Precautions/Restrictions: Intact Restrictions Weight Bearing Restrictions Per Provider Order: No     Mobility Bed Mobility Overal bed mobility: Needs Assistance Bed Mobility: Supine to Sit     Supine to sit: Min assist           Transfers Overall transfer level: Needs assistance Equipment used: 1 person hand held assist Transfers: Sit to/from Stand, Bed to chair/wheelchair/BSC Sit to Stand: Min assist     Step pivot transfers: Mod assist            Balance Overall balance assessment: Needs assistance Sitting-balance support: Feet supported Sitting balance-Leahy Scale: Fair     Standing balance support: Bilateral upper extremity supported Standing balance-Leahy Scale: Poor                             ADL either performed or assessed with clinical judgement   ADL Overall ADL's : Needs assistance/impaired Eating/Feeding: Moderate assistance;Sitting   Grooming: Moderate assistance;Sitting   Upper Body Bathing: Moderate assistance;Sitting   Lower Body Bathing: Maximal assistance;Sitting/lateral leans   Upper Body Dressing : Moderate assistance;Sitting   Lower Body Dressing: Maximal assistance;Sit to/from stand   Toilet Transfer: Moderate assistance;Stand-pivot;BSC/3in1                   Vision Patient Visual Report: No change from baseline       Perception Perception: Within Functional Limits       Praxis Praxis: WFL       Pertinent Vitals/Pain Pain Assessment Pain Assessment: No/denies pain Pain Intervention(s): Monitored during session     Extremity/Trunk Assessment Upper Extremity Assessment Upper Extremity Assessment: Right hand dominant;Left hand dominant;RUE deficits/detail;LUE deficits/detail RUE Deficits / Details: Increased tone, functional strength RUE Sensation: WNL RUE Coordination: decreased fine motor;decreased gross motor LUE Deficits / Details: Increased tone, functional strength LUE Sensation:  WNL LUE Coordination: decreased fine motor;decreased gross motor   Lower Extremity Assessment Lower Extremity Assessment: Defer to PT evaluation RLE Deficits / Details: 4+/5 knee extension, dorsiflexion/plantarflexion RLE Sensation: WNL RLE  Coordination: decreased gross motor LLE Deficits / Details: 4+/5 knee extension, dorsiflexion/plantarflexion LLE Sensation: WNL LLE Coordination: decreased gross motor       Communication Communication Communication: Impaired Factors Affecting Communication: Reduced clarity of speech   Cognition Arousal: Alert Behavior During Therapy: Flat affect Cognition: History of cognitive impairments                               Following commands: Impaired Following commands impaired: Follows one step commands with increased time     Cueing  General Comments   Cueing Techniques: Verbal cues   VSS on RA   Exercises     Shoulder Instructions      Home Living Family/patient expects to be discharged to:: Private residence Living Arrangements: Spouse/significant other Available Help at Discharge: Family;Available 24 hours/day Type of Home: House Home Access: Ramped entrance     Home Layout: One level     Bathroom Shower/Tub: Producer, television/film/video: Handicapped height Bathroom Accessibility: Yes How Accessible: Accessible via walker Home Equipment: Rolling Walker (2 wheels);Cane - single point;Wheelchair - manual;Shower seat   Additional Comments: Aide 2 x week and assists with shower. Husband cleans and cooks but does work full time.      Prior Functioning/Environment Prior Level of Function : History of Falls (last six months);Needs assist             Mobility Comments: patient reports short distance ambulation without a device per patient report ADLs Comments: per chart, patient has supervision for ADLs at baseline    OT Problem List: Decreased strength;Impaired balance (sitting and/or standing);Decreased coordination;Impaired tone;Decreased safety awareness   OT Treatment/Interventions: Self-care/ADL training;Therapeutic activities;Cognitive remediation/compensation;Patient/family education;DME and/or AE instruction;Balance training       OT Goals(Current goals can be found in the care plan section)   Acute Rehab OT Goals Patient Stated Goal: Return home OT Goal Formulation: With patient Time For Goal Achievement: 09/02/23 Potential to Achieve Goals: Good ADL Goals Pt Will Perform Eating: with modified independence;sitting Pt Will Perform Grooming: with modified independence;sitting Pt Will Perform Upper Body Bathing: with min assist;sitting Pt Will Perform Lower Body Bathing: with min assist;sitting/lateral leans Pt Will Perform Upper Body Dressing: with min assist;sitting Pt Will Perform Lower Body Dressing: with min assist;sitting/lateral leans Pt Will Transfer to Toilet: with contact guard assist;stand pivot transfer;bedside commode   OT Frequency:  Min 2X/week    Co-evaluation              AM-PAC OT 6 Clicks Daily Activity     Outcome Measure Help from another person eating meals?: A Lot Help from another person taking care of personal grooming?: A Lot Help from another person toileting, which includes using toliet, bedpan, or urinal?: A Lot Help from another person bathing (including washing, rinsing, drying)?: A Lot Help from another person to put on and taking off regular upper body clothing?: A Lot Help from another person to put on and taking off regular lower body clothing?: A Lot 6 Click Score: 12   End of Session Equipment Utilized During Treatment: Gait belt Nurse Communication: Mobility status  Activity Tolerance: Patient tolerated treatment well Patient left: in chair;with call bell/phone within reach;with chair alarm set;with family/visitor present  OT Visit  Diagnosis: Unsteadiness on feet (R26.81);Other abnormalities of gait and mobility (R26.89);Muscle weakness (generalized) (M62.81);History of falling (Z91.81)                Time: 1140-1200 OT Time Calculation (min): 20 min Charges:  OT General Charges $OT Visit: 1 Visit OT Evaluation $OT Eval Moderate Complexity: 1  Mod  08/19/2023  RP, OTR/L  Acute Rehabilitation Services  Office:  [phone]   [name] JONETTA Halsted 08/19/2023, 12:54 PM

## 2023-08-19 NOTE — Progress Notes (Signed)
 [facility] PROGRESS NOTE    Progress Note  [name]  FMW:[id number] DOB: 02/03/1955 DOA: 08/15/2023 PCP: Purcell Emil Schanz, MD     Brief Narrative:   Jacqueline Bonilla is an 68 y.o. female past medical history significant for CVA with right hemiparesis vascular dementia comes in initially as a code stroke as family noticed right facial droop and slurred speech, was found to have status epilepticus requiring mechanical ventilation, subsequently extubated on 08/17/2023   Assessment/Plan:   Status epilepticus/seizure (HCC) Likely due to medication noncompliance she was recently started on Depakote  which she was not taking. Initially intubated now extubated loaded with Keppra . Neurology was consulted, MRI of the brain showed no acute findings EEG showed cortical dysfunction of the left hemisphere likely secondary to structural abnormality, postictal state. PT OT was consulted Neurology will sign off  Fever unclear source: Single episode of fevers with a Tmax of 102.9, now she has defervesced. Has had no leukocytosis.  Cultures have been negative to date. She has been on no antibiotics.  He is able to follow commands. No further fevers.  Acute respiratory failure with hypoxia: Likely due to status epilepticus, still requiring 4 L oxygen  due to saturations greater than 90%. Weaned to room air, out of bed to chair, incentive spirometry. PT OT eval is pending.  Essential hypertension Continue to hold antihypertensive medication blood pressure stable.  Malnutrition of moderate degree Noted.  Pressure injury of skin Turn patien every 2 hours, out of bed to chair. Consult physical therapy  DVT prophylaxis: lovenox  Family Communication:non Status is: Inpatient Remains inpatient appropriate because: Status epilepticus    Code Status:     Code Status Orders  (From admission, onward)           Start     Ordered   08/15/23 1107  Full code   Continuous       Question:  By:  Answer:  Default: patient does not have capacity for decision making, no surrogate or prior directive available   08/15/23 1107           Code Status History     Date Active Date Inactive Code Status Order ID Comments User Context   08/15/2023 1035 08/15/2023 1107 Full Code [id number]  Yolande Lamar BROCKS, MD ED   06/30/2023 1848 07/03/2023 0313 Full Code [id number]  Sim Emery CROME, MD ED   04/01/2023 1506 04/10/2023 1754 Full Code [id number]  Pegge Toribio PARAS, PA-C Inpatient   04/01/2023 1506 04/01/2023 1506 Full Code [id number]  Pegge Toribio PARAS, PA-C Inpatient   03/27/2023 1034 04/01/2023 1503 Full Code [id number]  Remi Pippin, NP ED   07/29/2021 1548 08/02/2021 1937 Full Code [id number]  Laurita Cort DASEN, MD ED   12/08/2020 1455 12/17/2020 1546 Full Code [id number]  Pegge Toribio PARAS, PA-C Inpatient   12/01/2020 1115 12/08/2020 1454 Full Code [id number]  Merrianne Locus, MD ED   06/28/2020 1621 07/08/2020 1935 Full Code [id number]  Maurice Sharlet GORMAN DEVONNA Inpatient   06/24/2020 1331 06/28/2020 1542 Full Code [id number]  Waddell Rake, MD ED         IV Access:   Peripheral IV   Procedures and diagnostic studies:   No results found.   Medical Consultants:   None.   Subjective:    [name] no complaints  Objective:    Vitals:   08/18/23 1925 08/18/23 2000 08/18/23 2113 08/19/23 0302  BP:  114/75 108/65 117/85  Pulse: 92  93 (!) 101  Resp: 18  17 18 16   Temp: 98.6 F (37 C)  98.4 F (36.9 C) 98.4 F (36.9 C)  TempSrc: Oral  Oral Oral  SpO2: 100% 100% 100% 99%  Weight:      Height:   5' 4 (1.626 m)    SpO2: 99 % O2 Flow Rate (L/min): 4 L/min FiO2 (%): 40 %   Intake/Output Summary (Last 24 hours) at 08/19/2023 0601 Last data filed at 08/19/2023 0330 Gross per 24 hour  Intake 120 ml  Output 575 ml  Net -455 ml   Filed Weights   08/15/23 1300 08/16/23 0235 08/17/23 0500  Weight: 52.4 kg 51.3 kg 54.4 kg    Exam: General exam: In  no acute distress. Respiratory system: Good air movement and clear to auscultation. Cardiovascular system: S1 & S2 heard, RRR. No JVD.  Gastrointestinal system: Abdomen is nondistended, soft and nontender.  Extremities: No pedal edema. Skin: No rashes, lesions or ulcers Psychiatry: Judgement and insight appear normal. Mood & affect appropriate.    Data Reviewed:    Labs: Basic Metabolic Panel: Recent Labs  Lab 08/15/23 1020 08/15/23 1025 08/15/23 1219 08/16/23 0258 08/17/23 0235 08/18/23 0435 08/19/23 0351  NA 137 140 136 133* 132* 136 136  K 4.0 4.0 4.1 3.5 3.8 3.8 4.0  CL 103 102  --  100 101 103 103  CO2 21*  --   --  21* 20* 23 24  GLUCOSE 133* 134*  --  122* 173* 113* 103*  BUN 16 19  --  13 14 12 16   CREATININE 0.91 0.90  --  0.80 0.79 0.76 0.71  CALCIUM  9.4  --   --  8.9 8.8* 8.8* 8.8*  MG 1.9  --   --  2.0 1.9 1.8 1.7  PHOS  --   --   --  3.7 4.0  4.0 3.6 3.7   GFR Estimated Creatinine Clearance: 56.2 mL/min (by C-G formula based on SCr of 0.71 mg/dL). Liver Function Tests: Recent Labs  Lab 08/15/23 1020 08/17/23 0235  AST 23  --   ALT 13  --   ALKPHOS 53  --   BILITOT 0.4  --   PROT 7.8  --   ALBUMIN 3.7 3.1*   No results for input(s): LIPASE, AMYLASE in the last 168 hours. Recent Labs  Lab 08/16/23 0258  AMMONIA 21   Coagulation profile No results for input(s): INR, PROTIME in the last 168 hours. COVID-19 Labs  No results for input(s): DDIMER, FERRITIN, LDH, CRP in the last 72 hours.  Lab Results  Component Value Date   SARSCOV2NAA NEGATIVE 12/01/2020   SARSCOV2NAA NEGATIVE 06/24/2020    CBC: Recent Labs  Lab 08/15/23 1020 08/15/23 1025 08/15/23 1348 08/16/23 0258 08/17/23 0235 08/18/23 0435 08/19/23 0351  WBC 7.1  --  5.8 5.9 7.7 9.3 7.3  NEUTROABS 4.6  --   --   --   --   --   --   HGB 13.3   < > 13.8 11.6* 11.6* 11.6* 10.9*  HCT 41.0   < > 42.8 35.4* 35.4* 36.0 33.7*  MCV 91.3  --  91.8 88.7 90.3 91.4 91.1   PLT 212  --  191 217 205 187 207   < > = values in this interval not displayed.   Cardiac Enzymes: Recent Labs  Lab 08/15/23 1348  CKTOTAL 79   BNP (last 3 results) No results for input(s): PROBNP in the last 8760 hours. CBG: Recent Labs  Lab 08/17/23 0727 08/17/23  1100 08/17/23 1544 08/18/23 0716 08/18/23 1122  GLUCAP 104* 114* 138* 101* 94   D-Dimer: No results for input(s): DDIMER in the last 72 hours. Hgb A1c: No results for input(s): HGBA1C in the last 72 hours. Lipid Profile: Recent Labs    08/17/23 0235  TRIG 87   Thyroid  function studies: No results for input(s): TSH, T4TOTAL, T3FREE, THYROIDAB in the last 72 hours.  Invalid input(s): FREET3 Anemia work up: No results for input(s): VITAMINB12, FOLATE, FERRITIN, TIBC, IRON, RETICCTPCT in the last 72 hours. Sepsis Labs: Recent Labs  Lab 08/15/23 1348 08/16/23 0258 08/17/23 0235 08/18/23 0435 08/19/23 0351  WBC 5.8 5.9 7.7 9.3 7.3  LATICACIDVEN 1.7  --   --   --   --    Microbiology Recent Results (from the past 240 hours)  MRSA Next Gen by PCR, Nasal     Status: None   Collection Time: 08/15/23 12:39 PM   Specimen: Nasal Mucosa; Nasal Swab  Result Value Ref Range Status   MRSA by PCR Next Gen NOT DETECTED NOT DETECTED Final    Comment: (NOTE) The GeneXpert MRSA Assay (FDA approved for NASAL specimens only), is one component of a comprehensive MRSA colonization surveillance program. It is not intended to diagnose MRSA infection nor to guide or monitor treatment for MRSA infections. Test performance is not FDA approved in patients less than 45 years old. Performed at [facility], [address] [place], [name] [address]   Culture, blood (Routine X 2) w Reflex to ID Panel     Status: None (Preliminary result)   Collection Time: 08/17/23  2:35 AM   Specimen: BLOOD LEFT HAND  Result Value Ref Range Status   Specimen Description BLOOD LEFT HAND  Final    Special Requests   Final    BOTTLES DRAWN AEROBIC AND ANAEROBIC Blood Culture results may not be optimal due to an inadequate volume of blood received in culture bottles   Culture   Final    NO GROWTH 1 DAY Performed at [facility], [address] [place], [name] [address]    Report Status PENDING  Incomplete  Culture, blood (Routine X 2) w Reflex to ID Panel     Status: None (Preliminary result)   Collection Time: 08/17/23  2:38 AM   Specimen: BLOOD LEFT ARM  Result Value Ref Range Status   Specimen Description BLOOD LEFT ARM  Final   Special Requests   Final    BOTTLES DRAWN AEROBIC AND ANAEROBIC Blood Culture results may not be optimal due to an inadequate volume of blood received in culture bottles   Culture   Final    NO GROWTH 1 DAY Performed at [facility], [address], [place], [name] [address]    Report Status PENDING  Incomplete     Medications:    aspirin   81 mg Oral Daily   atorvastatin   40 mg Oral Daily   Chlorhexidine  Gluconate Cloth  6 each Topical Daily   enoxaparin  (LOVENOX ) injection  40 mg Subcutaneous Q24H   insulin  aspart  0-9 Units Subcutaneous BID   levETIRAcetam   1,000 mg Oral BID   multivitamin with minerals  1 tablet Oral Daily   mupirocin  ointment   Topical BID   thiamine   100 mg Oral Q24H   Continuous Infusions:  feeding supplement (OSMOLITE 1.2 CAL) Stopped (08/17/23 0839)      LOS: 4 days   Erle Odell Castor  [facility]  08/19/2023,  6:01 AM

## 2023-08-19 NOTE — Progress Notes (Signed)

## 2023-08-19 NOTE — Evaluation (Signed)
 Physical Therapy Evaluation Patient Details Name: BROOKLYN ALFREDO MRN: [id number] DOB: [birth year] Today's Date: 08/19/2023  History of Present Illness  Patient is a 68 year old female coming in initially as code stroke as family noted right facial droop, slurred speech, found to have status epilepticus. Was intubated and extubated on 08/17/23. PMH: CVA with right hemiparesis, vascular dementia.  Clinical Impression  Patient is agreeable to PT session. She reports she lives with her spouse where she was ambulatory without a device for short distance ambulation. She is able to follow single step commands with increased time.  Today the patient required Min A for bed mobility. Several standing bouts performed with increased assistance required without assistive device. Gait training initiated with cues for improved gait kinematics, steadying assistance needed in addition to assist to negotiate rolling walker. The patient is not at her baseline level of functional independence. Consider rehabilitation > 3 hours/day after this hospital stay. PT will continue to follow to maximize independence and decrease caregiver burden.       If plan is discharge home, recommend the following: A little help with walking and/or transfers;A little help with bathing/dressing/bathroom;Assist for transportation;Help with stairs or ramp for entrance;Assistance with cooking/housework;Direct supervision/assist for medications management;Direct supervision/assist for financial management   Can travel by private vehicle        Equipment Recommendations None recommended by PT  Recommendations for Other Services  Rehab consult    Functional Status Assessment Patient has had a recent decline in their functional status and demonstrates the ability to make significant improvements in function in a reasonable and predictable amount of time.     Precautions / Restrictions Precautions Precautions: Fall Recall of  Precautions/Restrictions: Intact Restrictions Weight Bearing Restrictions Per Provider Order: No      Mobility  Bed Mobility Overal bed mobility: Needs Assistance Bed Mobility: Supine to Sit     Supine to sit: Min assist     General bed mobility comments: assistance required for trunk support and to scoot hips forward on the bed. cues for technique    Transfers Overall transfer level: Needs assistance Equipment used: Rolling walker (2 wheels) Transfers: Sit to/from Stand, Bed to chair/wheelchair/BSC Sit to Stand: Min assist   Step pivot transfers: Mod assist       General transfer comment: Mod A for step pivot transfer to chair. Min A for standing from chair with arms. cues for technique, hand placement    Ambulation/Gait Ambulation/Gait assistance: Min assist, Mod assist Gait Distance (Feet): 18 Feet Assistive device: Rolling walker (2 wheels) Gait Pattern/deviations: Shuffle, Decreased stride length Gait velocity: decreased     General Gait Details: steadying assistance with cues for increased step length. occasional assistance required for negotiation of rolling walker.  Stairs            Wheelchair Mobility     Tilt Bed    Modified Rankin (Stroke Patients Only)       Balance Overall balance assessment: Needs assistance Sitting-balance support: Feet supported Sitting balance-Leahy Scale: Fair     Standing balance support: Bilateral upper extremity supported Standing balance-Leahy Scale: Poor Standing balance comment: relying on UE support to maintain standing                             Pertinent Vitals/Pain Pain Assessment Pain Assessment: Faces Faces Pain Scale: Hurts a little bit Pain Location: right wrist Pain Descriptors / Indicators: Sore Pain Intervention(s): Monitored during session,  Limited activity within patient's tolerance, Repositioned    Home Living Family/patient expects to be discharged to:: Private  residence Living Arrangements: Spouse/significant other Available Help at Discharge: Family;Available 24 hours/day Type of Home: House Home Access: Ramped entrance (patient reports ramp, chart indicates patient has 5 reps with rail)       Home Layout: One level Home Equipment: Agricultural consultant (2 wheels);Cane - single point;Wheelchair - manual;Shower seat      Prior Function Prior Level of Function : History of Falls (last six months);Needs assist             Mobility Comments: patient reports short distance ambulation without a device per patient report ADLs Comments: per chart, patient has supervision for ADLs at baseline     Extremity/Trunk Assessment   Upper Extremity Assessment Upper Extremity Assessment: Defer to OT evaluation;Right hand dominant (chronic right arm weakness following stroke per patient report)    Lower Extremity Assessment Lower Extremity Assessment: LLE deficits/detail;RLE deficits/detail RLE Deficits / Details: 4+/5 knee extension, dorsiflexion/plantarflexion RLE Sensation: WNL RLE Coordination: decreased gross motor LLE Deficits / Details: 4+/5 knee extension, dorsiflexion/plantarflexion LLE Sensation: WNL LLE Coordination: decreased gross motor       Communication   Communication Communication: Impaired Factors Affecting Communication: Reduced clarity of speech    Cognition Arousal: Alert Behavior During Therapy: Flat affect   PT - Cognitive impairments: No family/caregiver present to determine baseline                       PT - Cognition Comments: patient is oriented to place, time (August), person. she is able to follow single step commands with increased time Following commands: Impaired Following commands impaired: Follows one step commands with increased time     Cueing Cueing Techniques: Verbal cues     General Comments      Exercises     Assessment/Plan    PT Assessment Patient needs continued PT services   PT Problem List Decreased strength;Decreased range of motion;Decreased activity tolerance;Decreased balance;Decreased mobility;Decreased safety awareness;Decreased cognition;Decreased coordination       PT Treatment Interventions DME instruction;Gait training;Stair training;Functional mobility training;Therapeutic activities;Therapeutic exercise;Balance training;Neuromuscular re-education;Cognitive remediation;Patient/family education    PT Goals (Current goals can be found in the Care Plan section)  Acute Rehab PT Goals Patient Stated Goal: to walk PT Goal Formulation: With patient Time For Goal Achievement: 09/02/23 Potential to Achieve Goals: Good    Frequency Min 3X/week     Co-evaluation               AM-PAC PT 6 Clicks Mobility  Outcome Measure Help needed turning from your back to your side while in a flat bed without using bedrails?: A Little Help needed moving from lying on your back to sitting on the side of a flat bed without using bedrails?: A Little Help needed moving to and from a bed to a chair (including a wheelchair)?: A Lot Help needed standing up from a chair using your arms (e.g., wheelchair or bedside chair)?: A Little Help needed to walk in hospital room?: A Lot Help needed climbing 3-5 steps with a railing? : A Lot 6 Click Score: 15    End of Session   Activity Tolerance: Patient tolerated treatment well Patient left: in chair;with call bell/phone within reach;with chair alarm set Nurse Communication: Mobility status PT Visit Diagnosis: Muscle weakness (generalized) (M62.81);Unsteadiness on feet (R26.81)    Time: 9091-9069 PT Time Calculation (min) (ACUTE ONLY): 22 min  Charges:   PT Evaluation $PT Eval Moderate Complexity: 1 Mod PT Treatments $Therapeutic Activity: 8-22 mins PT General Charges $$ ACUTE PT VISIT: 1 Visit        Randine Essex, PT, MPT   Randine LULLA Essex 08/19/2023, 10:37 AM

## 2023-08-20 ENCOUNTER — Encounter: Admitting: Occupational Therapy

## 2023-08-20 DIAGNOSIS — R569 Unspecified convulsions: Secondary | ICD-10-CM | POA: Diagnosis not present

## 2023-08-20 LAB — CBC
HCT: 35.6 % — ABNORMAL LOW (ref 36.0–46.0)
Hemoglobin: 11.9 g/dL — ABNORMAL LOW (ref 12.0–15.0)
MCH: 29.5 pg (ref 26.0–34.0)
MCHC: 33.4 g/dL (ref 30.0–36.0)
MCV: 88.3 fL (ref 80.0–100.0)
Platelets: 250 K/uL (ref 150–400)
RBC: 4.03 MIL/uL (ref 3.87–5.11)
RDW: 13.6 % (ref 11.5–15.5)
WBC: 5 K/uL (ref 4.0–10.5)
nRBC: 0 % (ref 0.0–0.2)

## 2023-08-20 LAB — BASIC METABOLIC PANEL WITH GFR
Anion gap: 10 (ref 5–15)
BUN: 13 mg/dL (ref 8–23)
CO2: 25 mmol/L (ref 22–32)
Calcium: 9.3 mg/dL (ref 8.9–10.3)
Chloride: 105 mmol/L (ref 98–111)
Creatinine, Ser: 0.68 mg/dL (ref 0.44–1.00)
GFR, Estimated: >60 mL/min (ref >60)
Glucose, Bld: 113 mg/dL — ABNORMAL HIGH (ref 70–99)
Potassium: 3.7 mmol/L (ref 3.5–5.1)
Sodium: 140 mmol/L (ref 135–145)

## 2023-08-20 LAB — GLUCOSE, CAPILLARY
Glucose-Capillary: 86 mg/dL (ref 70–99)
Glucose-Capillary: 86 mg/dL (ref 70–99)
Glucose-Capillary: 177 mg/dL — ABNORMAL HIGH (ref 70–99)

## 2023-08-20 LAB — PHOSPHORUS: Phosphorus: 4 mg/dL (ref 2.5–4.6)

## 2023-08-20 LAB — MAGNESIUM: Magnesium: 1.8 mg/dL (ref 1.7–2.4)

## 2023-08-20 MED ORDER — ENSURE PLUS HIGH PROTEIN PO LIQD
237.0000 mL | Freq: Three times a day (TID) | ORAL | Status: DC
  Administered 2023-08-20 – 2023-08-24 (×6): 237 mL via ORAL

## 2023-08-20 NOTE — Progress Notes (Signed)
 Physical Therapy Treatment Patient Details Name: Jacqueline Bonilla MRN: [id number] DOB: November 18, 1955 Today's Date: 08/20/2023   History of Present Illness Patient is a 68 year old female coming in initially as code stroke as family noted right facial droop, slurred speech, found to have status epilepticus. Was intubated and extubated on 08/17/23. PMH: CVA with right hemiparesis, vascular dementia.    PT Comments  Pt received in recliner, A&O x2-3, pt agreeable to therapy session and with good participation and tolerance for seated exercise instruction and gait training with RW vs HHA. Pt needing consistent mod/maxA for standing hip flexion with HHA and minA progressing to modA for gait with RW, with poor assistive device management and significant instability as distance progressed, pt leaning toward her L side and lifting R side of RW off floor even while taking a standing break/not turning. Pt BP assessed after returning to EOB to sit and did have drop in SBP but unclear if this is related to decreased stability with gait or just due to pt fatigue. Pt tachy with standing activity and poor awareness of deficits/imbalance. Pt appears to have some R neglect but unclear if this is baseline from prior CVA or residual post-ictal symptoms. Patient will benefit from intensive inpatient follow-up therapy, >3 hours/day.    Patient Position (if appropriate) Orthostatic Vitals  Orthostatic Sitting  BP- Sitting 122/84 (post-ambulation)  Pulse- Sitting 93  Orthostatic Standing at 0 minutes  BP- Standing at 0 minutes 105/87 (c/o fatigue)  Pulse- Standing at 0 minutes 108  Orthostatic Standing at 3 minutes  BP- Standing at 3 minutes 112/83 (taken after standing hip flexion)  Pulse- Standing at 3 minutes 113     If plan is discharge home, recommend the following: A little help with walking and/or transfers;A little help with bathing/dressing/bathroom;Assist for transportation;Help with stairs or ramp for  entrance;Assistance with cooking/housework;Direct supervision/assist for medications management;Direct supervision/assist for financial management   Can travel by private vehicle        Equipment Recommendations  None recommended by PT (recommend a ramp be rented or installed for home ease of entry)    Recommendations for Other Services       Precautions / Restrictions Precautions Precautions: Fall Recall of Precautions/Restrictions: Impaired Precaution/Restrictions Comments: seizure precs; watch BP/HR Restrictions Weight Bearing Restrictions Per Provider Order: No     Mobility  Bed Mobility Overal bed mobility: Needs Assistance Bed Mobility: Sit to Supine       Sit to supine: Min assist   General bed mobility comments: Cues for body mechanics with poor carryover, pt needs trunk and BLE assist to return to middle of bed, pt tending to lie perpendicular in bed when given less assist, seeming unaware of posture.    Transfers Overall transfer level: Needs assistance Equipment used: Rolling walker (2 wheels), 1 person hand held assist Transfers: Sit to/from Stand Sit to Stand: Min assist           General transfer comment: from recliner>RW and RW>EOB, then to/from EOB with HHA, pt relying on locked bed frame behind LE for support upon standing and posterior bias throughout.    Ambulation/Gait Ambulation/Gait assistance: Min assist, Mod assist Gait Distance (Feet): 20 Feet Assistive device: Rolling walker (2 wheels), 1 person hand held assist Gait Pattern/deviations: Shuffle, Decreased dorsiflexion - right, Decreased dorsiflexion - left, Leaning posteriorly, Narrow base of support, Step-to pattern Gait velocity: decreased Gait velocity interpretation: <1.31 ft/sec, indicative of household ambulator Pre-gait activities: standing hip flexion x10 reps with HHA;  posterior lean and pt quick to fatigue. General Gait Details: festinating type gait pattern with very low, very  small steps, very minimal improvement for cues for heel strike, improved stride length, RW management. Pt having significant difficulty managing RW and needs totalA to manage AD with backward steps. Pt reports she does better without RW and needing modA for standing hip flexion/weight shifting at bedside with +1 HHA after seated break.   Stairs Stairs:  (defer due to elevated HR wtih gait and pt c/o fatigue)           Wheelchair Mobility     Tilt Bed    Modified Rankin (Stroke Patients Only)       Balance Overall balance assessment: Needs assistance Sitting-balance support: Feet supported Sitting balance-Leahy Scale: Poor Sitting balance - Comments: with upright unsupported sitting in chair, pt with posterior lean/imbalance attempting seated LAQ and seated hip flexion after a few reps despite cues to maintain upright posture Postural control: Posterior lean Standing balance support: Bilateral upper extremity supported Standing balance-Leahy Scale: Poor Standing balance comment: relying on UE support to maintain standing, especially with dynamic task                            Communication Communication Communication: Impaired Factors Affecting Communication: Reduced clarity of speech;Difficulty expressing self  Cognition Arousal: Alert Behavior During Therapy: Flat affect   PT - Cognitive impairments: No family/caregiver present to determine baseline, Orientation, Awareness, Memory, Attention, Sequencing, Problem solving, Safety/Judgement   Orientation impairments: Time                   PT - Cognition Comments: She is oriented to place, month, year, person. States stroke when asked why she is in hospital and seems surprised when told that she had a seizure. Pt also suprised to find that it is after 1pm as she states I just ate breakfast although that was ~5+ hours ago per report from NT. Pt then agreeable to eat lunch with encouragement and set-up  assist. Pt is able to follow single step commands with increased time, decreased insight into deficits and safety. Following commands: Impaired Following commands impaired: Follows one step commands with increased time    Cueing Cueing Techniques: Verbal cues, Gestural cues, Tactile cues  Exercises Other Exercises Other Exercises: seated BLE AROM: hip flexion, LAQ with back unsupported x10 reps ea Other Exercises: standing hip flexion x10 reps with +1 HHA    General Comments General comments (skin integrity, edema, etc.): SpO2 100% on RA; HR 93-99 bpm sitting EOB after gait trial; see BP readings in comments above; pt unclear if she had dizziness or BLE weakness symptoms during gait trial, didn't check orthostatics until after most activity.      Pertinent Vitals/Pain Pain Assessment Pain Assessment: PAINAD Breathing: normal Negative Vocalization: none Facial Expression: smiling or inexpressive Body Language: relaxed Consolability: no need to console PAINAD Score: 0 Pain Intervention(s): Monitored during session, Repositioned    Home Living                          Prior Function            PT Goals (current goals can now be found in the care plan section) Acute Rehab PT Goals Patient Stated Goal: to walk PT Goal Formulation: With patient Time For Goal Achievement: 09/02/23 Progress towards PT goals: Progressing toward goals    Frequency  Min 3X/week      PT Plan      Co-evaluation              AM-PAC PT 6 Clicks Mobility   Outcome Measure  Help needed turning from your back to your side while in a flat bed without using bedrails?: A Little Help needed moving from lying on your back to sitting on the side of a flat bed without using bedrails?: A Lot (mod cues, w/o rail) Help needed moving to and from a bed to a chair (including a wheelchair)?: A Lot Help needed standing up from a chair using your arms (e.g., wheelchair or bedside chair)?: A  Lot Help needed to walk in hospital room?: A Lot Help needed climbing 3-5 steps with a railing? : Total 6 Click Score: 12    End of Session Equipment Utilized During Treatment: Gait belt Activity Tolerance: Patient tolerated treatment well;Patient limited by fatigue Patient left: in bed;with call bell/phone within reach;with bed alarm set;Other (comment) (bed in chair posture, heels floated, pt set up to eat her lunch from her tray) Nurse Communication: Mobility status;Precautions PT Visit Diagnosis: Muscle weakness (generalized) (M62.81);Unsteadiness on feet (R26.81)     Time: 8753-8679 PT Time Calculation (min) (ACUTE ONLY): 34 min  Charges:    $Gait Training: 8-22 mins $Therapeutic Exercise: 8-22 mins PT General Charges $$ ACUTE PT VISIT: 1 Visit                     Aidian Salomon P., PTA Acute Rehabilitation Services Secure Chat Preferred 9a-5:30pm Office: [phone]    Connell HERO [facility] 08/20/2023, 2:39 PM

## 2023-08-20 NOTE — Progress Notes (Signed)
 IP rehab admissions - I spoke with husband by phone.  He would like CIR.  I will follow up again tomorrow and open the case  with insurance after meeting with patient.  [phone]

## 2023-08-20 NOTE — Progress Notes (Addendum)
 [facility] PROGRESS NOTE    Progress Note  Jacqueline Bonilla  FMW:[id number] DOB: 1955/02/08 DOA: 08/15/2023 PCP: Purcell Emil Schanz, MD     Brief Narrative:   Jacqueline Bonilla is an 68 y.o. female past medical history significant for CVA with right hemiparesis vascular dementia comes in initially as a code stroke as family noticed right facial droop and slurred speech, was found to have status epilepticus requiring mechanical ventilation, subsequently extubated on 08/17/2023   Assessment/Plan:   Status epilepticus/seizure (HCC) Likely due to medication noncompliance Recently started on Depakote . Initially intubated now extubated, loaded with Keppra . Neurology was consulted, MRI of the brain showed no acute findings EEG showed cortical dysfunction of the left hemisphere likely secondary to structural abnormality, postictal state. PT OT evaluated the patient, she will need inpatient rehab  Fever unclear source: Single episode of fevers with a Tmax of 102.9, now she has defervesced. Has had no leukocytosis.  Cultures have been negative to date. She has been on no antibiotics.  He is able to follow commands. No further fevers.  Acute respiratory failure with hypoxia intubated for airway protection: Likely due to status epilepticus, still requiring 4 L oxygen  due to saturations greater than 90%. Weaned to room air, out of bed to chair, incentive spirometry. She has been weaned to room air.  Essential hypertension Continue to hold antihypertensive medication blood pressure stable. Will have to be resumed slowly as an outpatient.  Malnutrition of moderate degree Noted.  Pressure injury of skin Turn patien every 2 hours, out of bed to chair. Consult physical therapy  DVT prophylaxis: lovenox  Family Communication:non Status is: Inpatient Remains inpatient appropriate because: Status epilepticus    Code Status:     Code Status Orders  (From admission,  onward)           Start     Ordered   08/15/23 1107  Full code  Continuous       Question:  By:  Answer:  Default: patient does not have capacity for decision making, no surrogate or prior directive available   08/15/23 1107           Code Status History     Date Active Date Inactive Code Status Order ID Comments User Context   08/15/2023 1035 08/15/2023 1107 Full Code [id number]  Yolande Lamar BROCKS, MD ED   06/30/2023 1848 07/03/2023 0313 Full Code [id number]  Sim Emery CROME, MD ED   04/01/2023 1506 04/10/2023 1754 Full Code [id number]  Pegge Toribio PARAS, PA-C Inpatient   04/01/2023 1506 04/01/2023 1506 Full Code [id number]  Pegge Toribio PARAS, PA-C Inpatient   03/27/2023 1034 04/01/2023 1503 Full Code [id number]  Remi Pippin, NP ED   07/29/2021 1548 08/02/2021 1937 Full Code [id number]  Laurita Cort DASEN, MD ED   12/08/2020 1455 12/17/2020 1546 Full Code [id number]  Pegge Toribio PARAS, PA-C Inpatient   12/01/2020 1115 12/08/2020 1454 Full Code [id number]  Merrianne Locus, MD ED   06/28/2020 1621 07/08/2020 1935 Full Code [id number]  Maurice Sharlet GORMAN DEVONNA Inpatient   06/24/2020 1331 06/28/2020 1542 Full Code [id number]  Waddell Rake, MD ED         IV Access:   Peripheral IV   Procedures and diagnostic studies:   No results found.   Medical Consultants:   None.   Subjective:    Jacqueline Bonilla relates she feels better than yesterday.  Objective:    Vitals:   08/19/23 0716 08/19/23 1641 08/19/23 2034 08/20/23 0322  BP:  119/75 111/67 130/81 (!) 128/93  Pulse: 94 87 95 93  Resp: 18 18 18 18   Temp: 97.6 F (36.4 C) 98.2 F (36.8 C) 98 F (36.7 C) 98.5 F (36.9 C)  TempSrc:  Oral Oral Oral  SpO2: 97% 100% 99% 100%  Weight:      Height:       SpO2: 100 % O2 Flow Rate (L/min): 3 L/min FiO2 (%): 40 %   Intake/Output Summary (Last 24 hours) at 08/20/2023 0748 Last data filed at 08/20/2023 0322 Gross per 24 hour  Intake --  Output 500 ml  Net -500 ml   Filed Weights    08/15/23 1300 08/16/23 0235 08/17/23 0500  Weight: 52.4 kg 51.3 kg 54.4 kg    Exam: General exam: In no acute distress. Respiratory system: Good air movement and clear to auscultation. Cardiovascular system: S1 & S2 heard, RRR. No JVD. Gastrointestinal system: Abdomen is nondistended, soft and nontender.  Extremities: No pedal edema. Skin: No rashes, lesions or ulcers Psychiatry: Judgement and insight appear normal. Mood & affect appropriate. Data Reviewed:    Labs: Basic Metabolic Panel: Recent Labs  Lab 08/16/23 0258 08/17/23 0235 08/18/23 0435 08/19/23 0351 08/20/23 0516  NA 133* 132* 136 136 140  K 3.5 3.8 3.8 4.0 3.7  CL 100 101 103 103 105  CO2 21* 20* 23 24 25   GLUCOSE 122* 173* 113* 103* 113*  BUN 13 14 12 16 13   CREATININE 0.80 0.79 0.76 0.71 0.68  CALCIUM  8.9 8.8* 8.8* 8.8* 9.3  MG 2.0 1.9 1.8 1.7 1.8  PHOS 3.7 4.0  4.0 3.6 3.7 4.0   GFR Estimated Creatinine Clearance: 56.2 mL/min (by C-G formula based on SCr of 0.68 mg/dL). Liver Function Tests: Recent Labs  Lab 08/15/23 1020 08/17/23 0235  AST 23  --   ALT 13  --   ALKPHOS 53  --   BILITOT 0.4  --   PROT 7.8  --   ALBUMIN 3.7 3.1*   No results for input(s): LIPASE, AMYLASE in the last 168 hours. Recent Labs  Lab 08/16/23 0258  AMMONIA 21   Coagulation profile No results for input(s): INR, PROTIME in the last 168 hours. COVID-19 Labs  No results for input(s): DDIMER, FERRITIN, LDH, CRP in the last 72 hours.  Lab Results  Component Value Date   SARSCOV2NAA NEGATIVE 12/01/2020   SARSCOV2NAA NEGATIVE 06/24/2020    CBC: Recent Labs  Lab 08/15/23 1020 08/15/23 1025 08/16/23 0258 08/17/23 0235 08/18/23 0435 08/19/23 0351 08/20/23 0516  WBC 7.1   < > 5.9 7.7 9.3 7.3 5.0  NEUTROABS 4.6  --   --   --   --   --   --   HGB 13.3   < > 11.6* 11.6* 11.6* 10.9* 11.9*  HCT 41.0   < > 35.4* 35.4* 36.0 33.7* 35.6*  MCV 91.3   < > 88.7 90.3 91.4 91.1 88.3  PLT 212   < > 217  205 187 207 250   < > = values in this interval not displayed.   Cardiac Enzymes: Recent Labs  Lab 08/15/23 1348  CKTOTAL 79   BNP (last 3 results) No results for input(s): PROBNP in the last 8760 hours. CBG: Recent Labs  Lab 08/17/23 1544 08/18/23 0716 08/18/23 1122 08/19/23 1814 08/20/23 0604  GLUCAP 138* 101* 94 94 86   D-Dimer: No results for input(s): DDIMER in the last 72 hours. Hgb A1c: No results for input(s): HGBA1C in the last 72  hours. Lipid Profile: No results for input(s): CHOL, HDL, LDLCALC, TRIG, CHOLHDL, LDLDIRECT in the last 72 hours.  Thyroid  function studies: No results for input(s): TSH, T4TOTAL, T3FREE, THYROIDAB in the last 72 hours.  Invalid input(s): FREET3 Anemia work up: No results for input(s): VITAMINB12, FOLATE, FERRITIN, TIBC, IRON, RETICCTPCT in the last 72 hours. Sepsis Labs: Recent Labs  Lab 08/15/23 1348 08/16/23 0258 08/17/23 0235 08/18/23 0435 08/19/23 0351 08/20/23 0516  WBC 5.8   < > 7.7 9.3 7.3 5.0  LATICACIDVEN 1.7  --   --   --   --   --    < > = values in this interval not displayed.   Microbiology Recent Results (from the past 240 hours)  MRSA Next Gen by PCR, Nasal     Status: None   Collection Time: 08/15/23 12:39 PM   Specimen: Nasal Mucosa; Nasal Swab  Result Value Ref Range Status   MRSA by PCR Next Gen NOT DETECTED NOT DETECTED Final    Comment: (NOTE) The GeneXpert MRSA Assay (FDA approved for NASAL specimens only), is one component of a comprehensive MRSA colonization surveillance program. It is not intended to diagnose MRSA infection nor to guide or monitor treatment for MRSA infections. Test performance is not FDA approved in patients less than 25 years old. Performed at [facility], [address], [place], [name] [address]   Culture, blood (Routine X 2) w Reflex to ID Panel     Status: None (Preliminary result)   Collection Time: 08/17/23  2:35 AM    Specimen: BLOOD LEFT HAND  Result Value Ref Range Status   Specimen Description BLOOD LEFT HAND  Final   Special Requests   Final    BOTTLES DRAWN AEROBIC AND ANAEROBIC Blood Culture results may not be optimal due to an inadequate volume of blood received in culture bottles   Culture   Final    NO GROWTH 2 DAYS Performed at [facility], [address], [place], [name] [address]    Report Status PENDING  Incomplete  Culture, blood (Routine X 2) w Reflex to ID Panel     Status: None (Preliminary result)   Collection Time: 08/17/23  2:38 AM   Specimen: BLOOD LEFT ARM  Result Value Ref Range Status   Specimen Description BLOOD LEFT ARM  Final   Special Requests   Final    BOTTLES DRAWN AEROBIC AND ANAEROBIC Blood Culture results may not be optimal due to an inadequate volume of blood received in culture bottles   Culture   Final    NO GROWTH 2 DAYS Performed at [facility], [address], [place], [name] [address]    Report Status PENDING  Incomplete     Medications:    aspirin   81 mg Oral Daily   atorvastatin   40 mg Oral Daily   Chlorhexidine  Gluconate Cloth  6 each Topical Daily   enoxaparin  (LOVENOX ) injection  40 mg Subcutaneous Q24H   insulin  aspart  0-9 Units Subcutaneous BID   levETIRAcetam   1,000 mg Oral BID   multivitamin with minerals  1 tablet Oral Daily   mupirocin  ointment   Topical BID   thiamine   100 mg Oral Q24H   Continuous Infusions:  feeding supplement (OSMOLITE 1.2 CAL) Stopped (08/17/23 0839)      LOS: 5 days   Erle Odell Castor  [facility]  08/20/2023, 7:48 AM

## 2023-08-20 NOTE — PMR Pre-admission (Shared)
 PMR Admission Coordinator Pre-Admission Assessment  Patient: Jacqueline Bonilla is an 68 y.o., female MRN: [id number] DOB: 1955-07-08 Height: 5' 4 (162.6 cm) Weight: 54.4 kg  Insurance Information HMO: ***    PPO: ***     PCP: ***     IPA: ***     80/20: ***     OTHER: *** PRIMARY: [employer] HMO      Policy#: [id number]      Subscriber: patient CM Name: ***      Phone#: ***     Fax#: *** Pre-Cert#: ***      Employer: Not currently working Benefits:  Phone #: [phone]     Name: *** Eff. Date: ***     Deduct: ***      Out of Pocket Max: ***      Life Max: *** CIR: ***      SNF: *** Outpatient: ***     Co-Pay: *** Home Health: ***      Co-Pay: *** DME: ***     Co-Pay: *** Providers: in network  SECONDARY:       Policy#:      Phone#:   Artist:       Phone#:   The Data processing manager" for patients in Inpatient Rehabilitation Facilities with attached "Privacy Act Statement-Health Care Records" was provided and verbally reviewed with: Patient  Emergency Contact Information Contact Information     Name Relation Home Work Mobile   Gomer,[name] Spouse   [phone]      Other Contacts     Name Relation Home Work Mobile   [place] Son   [phone]       Current Medical History  Patient Admitting Diagnosis: Status epilepticus  History of Present Illness:A 68 year old female coming in initially as code stroke to [facility] ED on 08/15/23 as family noted right facial droop, slurred speech, found to have status epilepticus. Was intubated and extubated on 08/17/23. PMH: CVA with right hemiparesis, vascular dementia. PT/OT evaluations completed with recommendations for acute inpatient rehab admission.  Complete NIHSS TOTAL: 5  Patient's medical record from [facility] has been reviewed by the rehabilitation admission coordinator and physician.  Past Medical History  Past Medical History:  Diagnosis Date   Anemia     Anxiety    Depression    Dysmenorrhea    Endometriosis    Fibroid    Hypertension    Implantable loop recorder present 2021   Microhematuria    negative workup   Osteoporosis    Tachycardia    Thrombocytopenia (HCC)     Has the patient had major surgery during 100 days prior to admission? No  Family History   family history includes Cancer in her father; Dementia in her mother; Heart attack in her maternal grandfather; Polymyositis in her sister.  Current Medications  Current Facility-Administered Medications:    acetaminophen  (TYLENOL ) tablet 650 mg, 650 mg, Oral, Q6H PRN, Alghanim, Fahid, MD, 650 mg at 08/17/23 1306   aspirin  chewable tablet 81 mg, 81 mg, Oral, Daily, Alghanim, Fahid, MD, 81 mg at 08/20/23 1254   atorvastatin  (LIPITOR) tablet 40 mg, 40 mg, Oral, Daily, Alghanim, Fahid, MD, 40 mg at 08/20/23 1136   Chlorhexidine  Gluconate Cloth 2 % PADS 6 each, 6 each, Topical, Daily, Gleason, Leita SAUNDERS, PA-C, 6 each at 08/20/23 1137   docusate (COLACE) 50 MG/5ML liquid 100 mg, 100 mg, Oral, BID PRN, Alghanim, Fahid, MD   enoxaparin  (LOVENOX ) injection 40 mg, 40  mg, Subcutaneous, Q24H, Alghanim, Fahid, MD, 40 mg at 08/20/23 1356   feeding supplement (ENSURE PLUS HIGH PROTEIN) liquid 237 mL, 237 mL, Oral, TID BM, Odell Celinda Balo, MD, 237 mL at 08/20/23 1357   insulin  aspart (novoLOG ) injection 0-9 Units, 0-9 Units, Subcutaneous, BID, Amoako, Prince, MD   levETIRAcetam  (KEPPRA ) tablet 1,000 mg, 1,000 mg, Oral, BID, Alghanim, Fahid, MD, 1,000 mg at 08/20/23 1136   multivitamin with minerals tablet 1 tablet, 1 tablet, Oral, Daily, Alghanim, Fahid, MD, 1 tablet at 08/20/23 1136   mupirocin  ointment (BACTROBAN ) 2 %, , Topical, BID, Alghanim, Fahid, MD, Given at 08/20/23 1136   Oral care mouth rinse, 15 mL, Mouth Rinse, PRN, Alghanim, Fahid, MD   polyethylene glycol (MIRALAX  / GLYCOLAX ) packet 17 g, 17 g, Oral, Daily PRN, Alghanim, Fahid, MD   [DISCONTINUED] thiamine  (VITAMIN B1) 200  mg in sodium chloride  0.9 % 50 mL IVPB, 200 mg, Intravenous, Q24H **OR** thiamine  (VITAMIN B1) tablet 100 mg, 100 mg, Oral, Q24H, Alghanim, Fahid, MD, 100 mg at 08/20/23 1136  Patients Current Diet:  Diet Order             Diet regular Room service appropriate? Yes; Fluid consistency: Thin  Diet effective now                   Precautions / Restrictions Precautions Precautions: Fall Precaution/Restrictions Comments: seizure precs; watch BP/HR Restrictions Weight Bearing Restrictions Per Provider Order: No   Has the patient had 2 or more falls or a fall with injury in the past year? Yes  Prior Activity Level Community (5-7x/wk): Went out daily, was driving.  Prior Functional Level Self Care: Did the patient need help bathing, dressing, using the toilet or eating? Needed some help  Indoor Mobility: Did the patient need assistance with walking from room to room (with or without device)? Independent  Stairs: Did the patient need assistance with internal or external stairs (with or without device)? Needed some help  Functional Cognition: Did the patient need help planning regular tasks such as shopping or remembering to take medications? Independent  Patient Information Are you of Hispanic, Latino/a,or Spanish origin?: A. No, not of Hispanic, Latino/a, or Spanish origin What is your race?: A. White Do you need or want an interpreter to communicate with a doctor or health care staff?: 0. No Patient information obtained via proxy : yes  Patient's Response To:  Health Literacy and Transportation Is the patient able to respond to health literacy and transportation needs?: Yes Health Literacy - How often do you need to have someone help you when you read instructions, pamphlets, or other written material from your doctor or pharmacy?: Rarely In the past 12 months, has lack of transportation kept you from medical appointments or from getting medications?: No In the past 12 months,  has lack of transportation kept you from meetings, work, or from getting things needed for daily living?: No Higher education careers adviser obtained via proxy: No  Journalist, newspaper / Equipment Home Equipment: Agricultural consultant (2 wheels), [employer] - single point, Wheelchair - manual, Information systems manager  Prior Device Use: Indicate devices/aids used by the patient prior to current illness, exacerbation or injury? None of the above  Current Functional Level Cognition  Orientation Level: Oriented to person, Oriented to place, Oriented to time, Disoriented to situation    Extremity Assessment (includes Sensation/Coordination)  Upper Extremity Assessment: Right hand dominant, Left hand dominant, RUE deficits/detail, LUE deficits/detail RUE Deficits / Details: Increased tone, functional  strength RUE Sensation: WNL RUE Coordination: decreased fine motor, decreased gross motor LUE Deficits / Details: Increased tone, functional strength LUE Sensation: WNL LUE Coordination: decreased fine motor, decreased gross motor  Lower Extremity Assessment: Defer to PT evaluation RLE Deficits / Details: 4+/5 knee extension, dorsiflexion/plantarflexion RLE Sensation: WNL RLE Coordination: decreased gross motor LLE Deficits / Details: 4+/5 knee extension, dorsiflexion/plantarflexion LLE Sensation: WNL LLE Coordination: decreased gross motor    ADLs  Overall ADL's : Needs assistance/impaired Eating/Feeding: Moderate assistance, Sitting Grooming: Moderate assistance, Sitting Upper Body Bathing: Moderate assistance, Sitting Lower Body Bathing: Maximal assistance, Sitting/lateral leans Upper Body Dressing : Moderate assistance, Sitting Lower Body Dressing: Maximal assistance, Sit to/from stand Toilet Transfer: Moderate assistance, Stand-pivot, BSC/3in1    Mobility  Overal bed mobility: Needs Assistance Bed Mobility: Sit to Supine Supine to sit: Min assist Sit to supine: Min assist General bed mobility  comments: Cues for body mechanics with poor carryover, pt needs trunk and BLE assist to return to middle of bed, pt tending to lie perpendicular in bed when given less assist, seeming unaware of posture.    Transfers  Overall transfer level: Needs assistance Equipment used: Rolling walker (2 wheels), 1 person hand held assist Transfers: Sit to/from Stand Sit to Stand: Min assist Bed to/from chair/wheelchair/BSC transfer type:: Step pivot Step pivot transfers: Mod assist General transfer comment: from recliner>RW and RW>EOB, then to/from EOB with HHA, pt relying on locked bed frame behind LE for support upon standing and posterior bias throughout.    Ambulation / Gait / Stairs / Wheelchair Mobility  Ambulation/Gait Ambulation/Gait assistance: Min assist, Mod assist Gait Distance (Feet): 20 Feet Assistive device: Rolling walker (2 wheels), 1 person hand held assist Gait Pattern/deviations: Shuffle, Decreased dorsiflexion - right, Decreased dorsiflexion - left, Leaning posteriorly, Narrow base of support, Step-to pattern General Gait Details: festinating type gait pattern with very low, very small steps, very minimal improvement for cues for heel strike, improved stride length, RW management. Pt having significant difficulty managing RW and needs totalA to manage AD with backward steps. Pt reports she does better without RW and needing modA for standing hip flexion/weight shifting at bedside with +1 HHA after seated break. Gait velocity: decreased Gait velocity interpretation: <1.31 ft/sec, indicative of household ambulator Pre-gait activities: standing hip flexion x10 reps with HHA; posterior lean and pt quick to fatigue. Stairs:  (defer due to elevated HR wtih gait and pt c/o fatigue)    Posture / Balance Dynamic Sitting Balance Sitting balance - Comments: with upright unsupported sitting in chair, pt with posterior lean/imbalance attempting seated LAQ and seated hip flexion after a few reps  despite cues to maintain upright posture Balance Overall balance assessment: Needs assistance Sitting-balance support: Feet supported Sitting balance-Leahy Scale: Poor Sitting balance - Comments: with upright unsupported sitting in chair, pt with posterior lean/imbalance attempting seated LAQ and seated hip flexion after a few reps despite cues to maintain upright posture Postural control: Posterior lean Standing balance support: Bilateral upper extremity supported Standing balance-Leahy Scale: Poor Standing balance comment: relying on UE support to maintain standing, especially with dynamic task    Special considerations/life events  Skin *** and Special service needs ***   Previous Home Environment (from acute therapy documentation) Living Arrangements: Spouse/significant other Available Help at Discharge: Family, Available 24 hours/day Type of Home: House Home Layout: One level Home Access: Ramped entrance Bathroom Shower/Tub: Health visitor: Handicapped height Bathroom Accessibility: Yes How Accessible: Accessible via walker Additional Comments: Aide 2 x week  and assists with shower. Husband cleans and cooks but does work full time.  Discharge Living Setting Plans for Discharge Living Setting: Patient's home, House, Lives with (comment) (Lives with husband.) Type of Home at Discharge: House Discharge Home Layout: One level Discharge Home Access: Stairs to enter, Ramped entrance Entrance Stairs-Rails: Right, Left, Can reach both Entrance Stairs-Number of Steps: 3 Discharge Bathroom Shower/Tub: Walk-in shower, Door Discharge Bathroom Toilet: Handicapped height Discharge Bathroom Accessibility: Yes How Accessible: Accessible via walker  Social/Family/Support Systems Patient Roles: Spouse, Parent (Has spouse and son.) Contact Information: Andrew Soria - spouse Anticipated Caregiver: husband [name] Anticipated Industrial/product designer Information: [name] [phone] Ability/Limitations of Caregiver: Retired, can Geneticist, molecular Availability: 24/7 Discharge Plan Discussed with Primary Caregiver: Yes Is Caregiver In Agreement with Plan?: Yes Does Caregiver/Family have Issues with Lodging/Transportation while Pt is in Rehab?: No  Goals Patient/Family Goal for Rehab: PT/OT supervision goals Expected length of stay: 10-14 days Additional Information: Patient has been to inpatient rehab previously. Pt/Family Agrees to Admission and willing to participate: Yes Program Orientation Provided & Reviewed with Pt/Caregiver Including Roles  & Responsibilities: Yes  Decrease burden of Care through IP rehab admission: N/A  Possible need for SNF placement upon discharge: Not planned  Patient Condition: I have reviewed medical records from [facility], spoken with CSW, and patient and spouse. I discussed via phone for inpatient rehabilitation assessment.  Patient will benefit from ongoing PT and OT, can actively participate in 3 hours of therapy a day 5 days of the week, and can make measurable gains during the admission.  Patient will also benefit from the coordinated team approach during an Inpatient Acute Rehabilitation admission.  The patient will receive intensive therapy as well as Rehabilitation physician, nursing, social worker, and care management interventions.  Due to bladder management, bowel management, safety, skin/wound care, disease management, medication administration, pain management, and patient education the patient requires 24 hour a day rehabilitation nursing.  The patient is currently *** with mobility and basic ADLs.  Discharge setting and therapy post discharge at home with home health is anticipated.  Patient has agreed to participate in the Acute Inpatient Rehabilitation Program and will admit {Time; today/tomorrow:10263}.  Preadmission Screen Completed By:  Lovett CHRISTELLA Ropes, 08/20/2023 4:20  PM ______________________________________________________________________   Discussed status with Dr. PIERRETTE on *** at *** and received approval for admission today.  Admission Coordinator:  Lovett CHRISTELLA Ropes, RN, time PIERRETTEPattricia ***   Assessment/Plan: Diagnosis: *** Does the need for close, 24 hr/day Medical supervision in concert with the patient's rehab needs make it unreasonable for this patient to be served in a less intensive setting? {yes_no_potentially:3041433} Co-Morbidities requiring supervision/potential complications: *** Due to {due un:6958565}, does the patient require 24 hr/day rehab nursing? {yes_no_potentially:3041433} Does the patient require coordinated care of a physician, rehab nurse, PT, OT, and SLP to address physical and functional deficits in the context of the above medical diagnosis(es)? {yes_no_potentially:3041433} Addressing deficits in the following areas: {deficits:3041436} Can the patient actively participate in an intensive therapy program of at least 3 hrs of therapy 5 days a week? {yes_no_potentially:3041433} The potential for patient to make measurable gains while on inpatient rehab is {potential:3041437} Anticipated functional outcomes upon discharge from inpatient rehab: {functional outcomes:304600100} PT, {functional outcomes:304600100} OT, {functional outcomes:304600100} SLP Estimated rehab length of stay to reach the above functional goals is: *** Anticipated discharge destination: {anticipated dc setting:21604} 10. Overall Rehab/Functional Prognosis: {potential:3041437}   MD Signature: ***

## 2023-08-20 NOTE — TOC Progression Note (Signed)
 Transition of Care [facility]) - Progression Note    Patient Details  Name: Jacqueline Bonilla MRN: [id number] Date of Birth: 20-Oct-1955  Transition of Care [facility]) CM/SW Contact  Tom-Johnson, Sevana Grandinetti Daphne, RN Phone Number: 08/20/2023, 1:48 PM  Clinical Narrative:     CIR recommended, following for potential admit.   Patient not Medically ready for discharge.  CM will continue to follow as patient progresses with care towards discharge.                       Expected Discharge Plan and Services                                               Social Drivers of Health (SDOH) Interventions SDOH Screenings   Food Insecurity: No Food Insecurity (08/17/2023)  Housing: Low Risk  (08/17/2023)  Transportation Needs: No Transportation Needs (08/17/2023)  Utilities: At Risk (08/17/2023)  Alcohol Screen: Low Risk  (10/23/2022)  Depression (PHQ2-9): Low Risk  (07/24/2023)  Financial Resource Strain: Low Risk  (10/23/2022)  Physical Activity: Sufficiently Active (10/23/2022)  Social Connections: Moderately Isolated (08/17/2023)  Stress: No Stress Concern Present (08/28/2021)  Tobacco Use: Low Risk  (08/15/2023)  Health Literacy: Inadequate Health Literacy (10/23/2022)    Readmission Risk Interventions     No data to display

## 2023-08-20 NOTE — Care Management Important Message (Signed)
 Important Message  Patient Details  Name: Jacqueline Bonilla MRN: [id number] Date of Birth: [birth year]   Important Message Given:  Yes - Medicare IM     Claretta Deed 08/20/2023, 5:08 PM

## 2023-08-20 NOTE — Progress Notes (Signed)
 [facility] Summary Report / Loop Recorder

## 2023-08-20 NOTE — Progress Notes (Addendum)
 Nutrition Follow-up  DOCUMENTATION CODES:   Non-severe (moderate) malnutrition in context of chronic illness  INTERVENTION:  Add Ensure Plus High Protein po TID, each supplement provides 350 kcal and 20 grams of protein  Add Magic cup TID with meals, each supplement provides 290 kcal and 9 grams of protein  Add MVI w/ minerals Collect new weight to assess trend   NUTRITION DIAGNOSIS:  Moderate Malnutrition related to chronic illness (prior CVAs) as evidenced by moderate muscle depletion, moderate fat depletion.  GOAL:  Patient will meet greater than or equal to 90% of their needs  MONITOR:  TF tolerance, Vent status, Labs  REASON FOR ASSESSMENT:  Ventilator, Consult Enteral/tube feeding initiation and management  ASSESSMENT:  Pt with hx of seizure disorder, HTN, HLD, hx multiple CVAs, and osteoporosis presented to ED with active seizures.  8/14 - presented to ED, intubated  8/15 - tube feeds started 8/16 - extubated; Cortrak removed; diet advanced to regular texture 8/17 - txr out of ICU  CIR following for potential admission. Patient up in chair and sleeping at time of assessment. Difficult to rouse. On room air.   Average Meal Intake 8/19: 50% x2 documented meals  Per nurse tech, patient not taking much in orally. Reported 25% intake w/ breakfast today, however 50% of meal documented. Consumed banana, scrambled eggs, coffee, and English muffin. Lunch order consisted of hamburger and fresh fruit cup. Consumed 50%.   Nurse tech does report she does well with beverages. Will order Ensure Plus High Protein  as well as Magic Cup to augment intake.   Admit Weight: 52.4 kg Current Weight: 54.4 kg  Weight with slight trend up since admission. No significant edema on exam. Abdomen soft, non-distended. Bowels stable and formed.  Meds: SSI Novolog  0-9 units BID   Labs unremarkable. Medications stable.   Labs: Na+ 132--->140 (wdl) CBGs 103-113 x24 hours   Diet Order:    Diet Order             Diet regular Room service appropriate? Yes; Fluid consistency: Thin  Diet effective now             EDUCATION NEEDS:  Not appropriate for education at this time  Skin:  Skin Assessment: Reviewed RN Assessment  Last BM:  8/18 - type 2 x1  Height:   Ht Readings from Last 1 Encounters:  08/18/23 5' 4 (1.626 m)   Weight:  Wt Readings from Last 1 Encounters:  08/17/23 54.4 kg   Ideal Body Weight:  54.5 kg  BMI:  Body mass index is 20.59 kg/m.  Estimated Nutritional Needs:   Kcal:  1400-1600 kcal/d  Protein:  70-90g/d  Fluid:  >/=1.5L/d  Blair Deaner MS, RD, LDN Registered Dietitian Clinical Nutrition RD Inpatient Contact Info in Amion

## 2023-08-21 ENCOUNTER — Other Ambulatory Visit: Payer: Self-pay

## 2023-08-21 DIAGNOSIS — R569 Unspecified convulsions: Secondary | ICD-10-CM | POA: Diagnosis not present

## 2023-08-21 LAB — GLUCOSE, CAPILLARY
Glucose-Capillary: 136 mg/dL — ABNORMAL HIGH (ref 70–99)
Glucose-Capillary: 133 mg/dL — ABNORMAL HIGH (ref 70–99)
Glucose-Capillary: 126 mg/dL — ABNORMAL HIGH (ref 70–99)

## 2023-08-21 NOTE — Progress Notes (Signed)
 Physical Therapy Treatment Patient Details Name: Jacqueline Bonilla MRN: [id number] DOB: Oct 23, 1955 Today's Date: 08/21/2023   History of Present Illness Patient is a 68 year old female coming in initially as code stroke as family noted right facial droop, slurred speech, found to have status epilepticus. Was intubated and extubated on 08/17/23. PMH: CVA with right hemiparesis, vascular dementia.    PT Comments  PTA received request from office staff to return call from pt spouse [name] who is her caregiver but was not present during PT session on 8/19. PTA returned call and provided caregiver education. [name] was unaware this therapist was different person (similar name) to therapist who worked with pt in 2022, but was understanding when it was explained that this therapist had not worked with her before. [name] had questions about various levels of rehab and wanted more info on her disposition recommendation this admission. PTA provided info on recommendation given by supervising PT in her initial evaluation on 8/18 and pt progress toward her goals, spouse appreciative and motivated to support her when she returns home and very interested in her continuing with high intensity post-acute rehab. Patient will benefit from intensive inpatient follow-up therapy, >3 hours/day.     If plan is discharge home, recommend the following: A little help with walking and/or transfers;A little help with bathing/dressing/bathroom;Assist for transportation;Help with stairs or ramp for entrance;Assistance with cooking/housework;Direct supervision/assist for medications management;Direct supervision/assist for financial management   Can travel by private vehicle        Equipment Recommendations  None recommended by PT (recommend a ramp be rented or installed for home ease of entry)    Recommendations for Other Services       Precautions / Restrictions Precautions Precautions: Fall Recall of  Precautions/Restrictions: Impaired Precaution/Restrictions Comments: seizure precs; watch BP/HR Restrictions Weight Bearing Restrictions Per Provider Order: No     Mobility  Bed Mobility               General bed mobility comments: session emphasis on family/caregiver education    Transfers                   General transfer comment: did not assess today; just caregiver education    Ambulation/Gait               General Gait Details: defer -see comments       Communication   Cognition                               PT - Cognition Comments: Session focus on family education, PTA returned call to patient spouse per request from him Larue). Discussion on pt progress toward goals, and PT recommendation per prior Eval this admission as well as her progress during session with PTA on Monday. Spouse receptive and eager for her to get high intensity therapies at CIR post-acute.             PT Goals (current goals can now be found in the care plan section) Acute Rehab PT Goals Patient Stated Goal: to walk PT Goal Formulation: With patient Time For Goal Achievement: 09/02/23 Progress towards PT goals: Progressing toward goals    Frequency    Min 3X/week      PT Plan         AM-PAC PT 6 Clicks Mobility   Outcome Measure  Help needed turning from your back to your side while in a  flat bed without using bedrails?: A Little Help needed moving from lying on your back to sitting on the side of a flat bed without using bedrails?: A Lot (mod cues, w/o rail) Help needed moving to and from a bed to a chair (including a wheelchair)?: A Lot Help needed standing up from a chair using your arms (e.g., wheelchair or bedside chair)?: A Lot Help needed to walk in hospital room?: A Lot Help needed climbing 3-5 steps with a railing? : Total 6 Click Score: 12    End of Session   Activity Tolerance: Other (comment) (N/A; family education  only) Patient left:  (N/A; family education) Nurse Communication: Other (comment) (N/A; family education) PT Visit Diagnosis: Muscle weakness (generalized) (M62.81);Unsteadiness on feet (R26.81)     Time: 1434-1450 PT Time Calculation (min) (ACUTE ONLY): 16 min  Charges:    $Therapeutic Activity: 8-22 mins PT General Charges $$ ACUTE PT VISIT: 1 Visit                     Diangelo Radel P., PTA Acute Rehabilitation Services Secure Chat Preferred 9a-5:30pm Office: [phone]    [facility] 08/21/2023, 3:17 PM

## 2023-08-21 NOTE — Plan of Care (Signed)
   Problem: Education: Goal: Knowledge of General Education information will improve Description Including pain rating scale, medication(s)/side effects and non-pharmacologic comfort measures Outcome: Progressing

## 2023-08-21 NOTE — Progress Notes (Signed)
 Progress Note   Patient: Jacqueline Bonilla FMW:[id number] DOB: 06/12/1955 DOA: 08/15/2023     6 DOS: the patient was seen and examined on 08/21/2023   Brief hospital course: Jacqueline Bonilla is an 68 y.o. female past medical history significant for CVA with right hemiparesis vascular dementia comes in initially as a code stroke as family noticed right facial droop and slurred speech, was found to have status epilepticus requiring mechanical ventilation, subsequently extubated on 08/17/2023    Assessment and Plan:  Status epilepticus/seizure (HCC) Likely due to medication noncompliance Recently started on Depakote . Initially intubated now extubated, loaded with Keppra . Neurology was consulted, MRI of the brain showed no acute findings EEG showed cortical dysfunction of the left hemisphere likely secondary to structural abnormality, postictal state. PT has recommended acute rehab TOC on board with placement  Fever unclear source: Single episode of fevers with a Tmax of 102.9, now she has defervesced. Has had no leukocytosis.  Cultures have been negative to date. She has been on no antibiotics.  He is able to follow commands. No further fevers.   Acute respiratory failure with hypoxia intubated for airway protection: Likely due to status epilepticus, still requiring 4 L oxygen  due to saturations greater than 90%. Weaned to room air, out of bed to chair, incentive spirometry. She has been weaned to room air.   Essential hypertension Continue to hold antihypertensive medication blood pressure stable. Will have to be resumed slowly as an outpatient.   Malnutrition of moderate degree Noted.   Pressure injury of skin Turn patient every 2 hours, out of bed to chair. Consult physical therapy   DVT prophylaxis: lovenox  Family Communication:non Status is: Inpatient Remains inpatient appropriate because: Status epilepticus    Subjective:  Patient seen and examined at bedside this  morning Denies nausea vomiting abdominal pain chest pain TOC working on acute rehab placement  Physical Exam: General exam: In no acute distress. Respiratory system: Good air movement and clear to auscultation. Cardiovascular system: S1 & S2 heard, RRR. No JVD. Gastrointestinal system: Abdomen is nondistended, soft and nontender.  Extremities: No pedal edema. Skin: No rashes, lesions or ulcers Psychiatry: Judgement and insight appear normal. Mood & affect appropriate.   Vitals:   08/20/23 1700 08/20/23 2036 08/21/23 0500 08/21/23 0923  BP:  114/71 129/76 119/78  Pulse:  83 87 87  Resp:  18 18   Temp:  98.7 F (37.1 C) 98.1 F (36.7 C) 98.4 F (36.9 C)  TempSrc:    Oral  SpO2:  100%  99%  Weight: 52.8 kg     Height:        Data Reviewed:     Latest Ref Rng & Units 08/20/2023    5:16 AM 08/19/2023    3:51 AM 08/18/2023    4:35 AM  CBC  WBC 4.0 - 10.5 K/uL 5.0  7.3  9.3   Hemoglobin 12.0 - 15.0 g/dL 88.0  89.0  88.3   Hematocrit 36.0 - 46.0 % 35.6  33.7  36.0   Platelets 150 - 400 K/uL 250  207  187        Latest Ref Rng & Units 08/20/2023    5:16 AM 08/19/2023    3:51 AM 08/18/2023    4:35 AM  BMP  Glucose 70 - 99 mg/dL 886  896  886   BUN 8 - 23 mg/dL 13  16  12    Creatinine 0.44 - 1.00 mg/dL 9.31  9.28  9.23   Sodium 135 -  145 mmol/L 140  136  136   Potassium 3.5 - 5.1 mmol/L 3.7  4.0  3.8   Chloride 98 - 111 mmol/L 105  103  103   CO2 22 - 32 mmol/L 25  24  23    Calcium  8.9 - 10.3 mg/dL 9.3  8.8  8.8      Time spent: 40 minutes  Author: Drue ONEIDA Potter, MD 08/21/2023 3:51 PM  For on call review [email or web address].

## 2023-08-21 NOTE — Plan of Care (Signed)
  Problem: Education: Goal: Knowledge of General Education information will improve Description: Including pain rating scale, medication(s)/side effects and non-pharmacologic comfort measures Outcome: Progressing   Problem: Health Behavior/Discharge Planning: Goal: Ability to manage health-related needs will improve Outcome: Progressing   Problem: Clinical Measurements: Goal: Ability to maintain clinical measurements within normal limits will improve Outcome: Progressing Goal: Will remain free from infection Outcome: Progressing Goal: Diagnostic test results will improve Outcome: Progressing Goal: Respiratory complications will improve Outcome: Progressing Goal: Cardiovascular complication will be avoided Outcome: Progressing   Problem: Activity: Goal: Risk for activity intolerance will decrease Outcome: Progressing   Problem: Nutrition: Goal: Adequate nutrition will be maintained Outcome: Progressing   Problem: Coping: Goal: Level of anxiety will decrease Outcome: Progressing   Problem: Elimination: Goal: Will not experience complications related to bowel motility Outcome: Progressing Goal: Will not experience complications related to urinary retention Outcome: Progressing   Problem: Pain Managment: Goal: General experience of comfort will improve and/or be controlled Outcome: Progressing   Problem: Safety: Goal: Ability to remain free from injury will improve Outcome: Progressing   Problem: Skin Integrity: Goal: Risk for impaired skin integrity will decrease Outcome: Progressing   Problem: Safety: Goal: Non-violent Restraint(s) Outcome: Progressing   Problem: Education: Goal: Ability to describe self-care measures that may prevent or decrease complications (Diabetes Survival Skills Education) will improve Outcome: Progressing Goal: Individualized Educational Video(s) Outcome: Progressing   Problem: Coping: Goal: Ability to adjust to condition or change  in health will improve Outcome: Progressing   Problem: Fluid Volume: Goal: Ability to maintain a balanced intake and output will improve Outcome: Progressing   Problem: Health Behavior/Discharge Planning: Goal: Ability to identify and utilize available resources and services will improve Outcome: Progressing Goal: Ability to manage health-related needs will improve Outcome: Progressing   Problem: Metabolic: Goal: Ability to maintain appropriate glucose levels will improve Outcome: Progressing   Problem: Nutritional: Goal: Maintenance of adequate nutrition will improve Outcome: Progressing Goal: Progress toward achieving an optimal weight will improve Outcome: Progressing   Problem: Skin Integrity: Goal: Risk for impaired skin integrity will decrease Outcome: Progressing   Problem: Tissue Perfusion: Goal: Adequacy of tissue perfusion will improve Outcome: Progressing

## 2023-08-22 ENCOUNTER — Encounter: Admitting: Occupational Therapy

## 2023-08-22 ENCOUNTER — Ambulatory Visit: Payer: Self-pay | Admitting: Cardiology

## 2023-08-22 ENCOUNTER — Ambulatory Visit

## 2023-08-22 ENCOUNTER — Ambulatory Visit (INDEPENDENT_AMBULATORY_CARE_PROVIDER_SITE_OTHER)

## 2023-08-22 DIAGNOSIS — I639 Cerebral infarction, unspecified: Secondary | ICD-10-CM

## 2023-08-22 DIAGNOSIS — R569 Unspecified convulsions: Secondary | ICD-10-CM | POA: Diagnosis not present

## 2023-08-22 LAB — CBC
HCT: 34 % — ABNORMAL LOW (ref 36.0–46.0)
Hemoglobin: 11 g/dL — ABNORMAL LOW (ref 12.0–15.0)
MCH: 29.3 pg (ref 26.0–34.0)
MCHC: 32.4 g/dL (ref 30.0–36.0)
MCV: 90.4 fL (ref 80.0–100.0)
Platelets: 261 K/uL (ref 150–400)
RBC: 3.76 MIL/uL — ABNORMAL LOW (ref 3.87–5.11)
RDW: 13.6 % (ref 11.5–15.5)
WBC: 5.8 K/uL (ref 4.0–10.5)
nRBC: 0 % (ref 0.0–0.2)

## 2023-08-22 LAB — CULTURE, BLOOD (ROUTINE X 2)
Culture: NO GROWTH
Culture: NO GROWTH

## 2023-08-22 LAB — CUP PACEART REMOTE DEVICE CHECK
Date Time Interrogation Session: 20250820230513
Implantable Pulse Generator Implant Date: 20211020

## 2023-08-22 LAB — BASIC METABOLIC PANEL WITH GFR
Anion gap: 12 (ref 5–15)
BUN: 20 mg/dL (ref 8–23)
CO2: 24 mmol/L (ref 22–32)
Calcium: 8.9 mg/dL (ref 8.9–10.3)
Chloride: 103 mmol/L (ref 98–111)
Creatinine, Ser: 0.7 mg/dL (ref 0.44–1.00)
GFR, Estimated: >60 mL/min (ref >60)
Glucose, Bld: 106 mg/dL — ABNORMAL HIGH (ref 70–99)
Potassium: 4.1 mmol/L (ref 3.5–5.1)
Sodium: 139 mmol/L (ref 135–145)

## 2023-08-22 LAB — GLUCOSE, CAPILLARY
Glucose-Capillary: 85 mg/dL (ref 70–99)
Glucose-Capillary: 93 mg/dL (ref 70–99)
Glucose-Capillary: 89 mg/dL (ref 70–99)
Glucose-Capillary: 84 mg/dL (ref 70–99)

## 2023-08-22 NOTE — Progress Notes (Signed)
 Physical Therapy Treatment Patient Details Name: Jacqueline Bonilla MRN: [id number] DOB: August 26, 1955 Today's Date: 08/22/2023   History of Present Illness Patient is a 68 year old female coming in initially as code stroke as family noted right facial droop, slurred speech, found to have status epilepticus. Was intubated and extubated on 08/17/23. PMH: CVA with right hemiparesis, vascular dementia.    PT Comments  Pt pleasant and motivated to participate in therapy. Pt requiring min-mod assist (+2 safety) for functional mobility. Worked on sitting and standing balance at mirror for external feedback to achieve midline and decreased R lateral lean. Initially utilized walker, however, pt with increased stability and decreased shuffling/festinating gait pattern with handheld assist. Will continue to determine most appropriate assistive device. Recommend post acute rehab.    If plan is discharge home, recommend the following: A little help with walking and/or transfers;A little help with bathing/dressing/bathroom;Assist for transportation;Help with stairs or ramp for entrance;Assistance with cooking/housework;Direct supervision/assist for medications management;Direct supervision/assist for financial management   Can travel by private vehicle        Equipment Recommendations  None recommended by PT    Recommendations for Other Services       Precautions / Restrictions Precautions Precautions: Fall Recall of Precautions/Restrictions: Impaired Precaution/Restrictions Comments: R inattention/neglect Restrictions Weight Bearing Restrictions Per Provider Order: No     Mobility  Bed Mobility Overal bed mobility: Needs Assistance Bed Mobility: Supine to Sit     Supine to sit: Min assist     General bed mobility comments: Hand over hand guidance for locating bed rail on R, trunk elevation to sit fully upright due to R lateral lean initially    Transfers Overall transfer level: Needs  assistance Equipment used: Rolling walker (2 wheels), 1 person hand held assist Transfers: Sit to/from Stand Sit to Stand: Min assist           General transfer comment: MinA to stand from edge of bed and toilet. Tends to quickly sit and needs safety cueing    Ambulation/Gait Ambulation/Gait assistance: Min assist, Mod assist, +2 safety/equipment Gait Distance (Feet): 120 Feet Assistive device: Rolling walker (2 wheels), None Gait Pattern/deviations: Shuffle, Decreased dorsiflexion - right, Decreased dorsiflexion - left, Leaning posteriorly, Narrow base of support, Step-to pattern, Step-through pattern Gait velocity: decreased     General Gait Details: Pt demonstrates decreased safety with walker, often bumping into obstacles and not keeping all 4 points in contact with ground, requiring modA, particularly with turns. Pt actually with improved stability with HHA, cues for faster cadence and larger steps   Stairs             Wheelchair Mobility     Tilt Bed    Modified Rankin (Stroke Patients Only)       Balance Overall balance assessment: Needs assistance Sitting-balance support: Feet supported Sitting balance-Leahy Scale: Fair     Standing balance support: Single extremity supported, During functional activity Standing balance-Leahy Scale: Poor Standing balance comment: reliant on single UE support                            Communication Communication Communication: Impaired Factors Affecting Communication: Reduced clarity of speech;Difficulty expressing self  Cognition Arousal: Alert Behavior During Therapy: Flat affect   PT - Cognitive impairments: No family/caregiver present to determine baseline, Orientation, Awareness, Memory, Attention, Sequencing, Problem solving, Safety/Judgement   Orientation impairments: Time  PT - Cognition Comments: Pt not oriented to day of week or month (states September); able to  recall after being re-oriented with increased time. Following commands: Impaired Following commands impaired: Follows one step commands with increased time    Cueing Cueing Techniques: Verbal cues, Gestural cues, Tactile cues  Exercises      General Comments        Pertinent Vitals/Pain Pain Assessment Pain Assessment: No/denies pain    Home Living                          Prior Function            PT Goals (current goals can now be found in the care plan section) Acute Rehab PT Goals Patient Stated Goal: to walk Potential to Achieve Goals: Good Progress towards PT goals: Progressing toward goals    Frequency    Min 3X/week      PT Plan      Co-evaluation              AM-PAC PT 6 Clicks Mobility   Outcome Measure  Help needed turning from your back to your side while in a flat bed without using bedrails?: A Little Help needed moving from lying on your back to sitting on the side of a flat bed without using bedrails?: A Little Help needed moving to and from a bed to a chair (including a wheelchair)?: A Little Help needed standing up from a chair using your arms (e.g., wheelchair or bedside chair)?: A Little Help needed to walk in hospital room?: A Lot Help needed climbing 3-5 steps with a railing? : Total 6 Click Score: 15    End of Session Equipment Utilized During Treatment: Gait belt Activity Tolerance: Patient tolerated treatment well Patient left: in chair;with call bell/phone within reach;with chair alarm set   PT Visit Diagnosis: Muscle weakness (generalized) (M62.81);Unsteadiness on feet (R26.81)     Time: 1445-1510 PT Time Calculation (min) (ACUTE ONLY): 25 min  Charges:    $Therapeutic Activity: 23-37 mins PT General Charges $$ ACUTE PT VISIT: 1 Visit                     Aleck Daring, PT, DPT Acute Rehabilitation Services Office [phone]    Alayne ONEIDA Daring 08/22/2023, 4:32 PM

## 2023-08-22 NOTE — Progress Notes (Signed)
 Occupational Therapy Treatment Patient Details Name: Jacqueline Bonilla MRN: [id number] DOB: February 08, 1955 Today's Date: 08/22/2023   History of present illness Patient is a 68 year old female coming in initially as code stroke as family noted right facial droop, slurred speech, found to have status epilepticus. Was intubated and extubated on 08/17/23. PMH: CVA with right hemiparesis, vascular dementia.   OT comments  Patient able to progress from seated ADL to standing ADL/grooming.  Patient needing constant input to correct backwards lean.  Setup and Mod A for grooming, Max A for ADL sit to stand.  Patient unable to release grip on RW due to poor balance.  OT will continue efforts in the acute setting to address deficits, and Patient will benefit from intensive inpatient follow-up therapy, >3 hours/day.  OT will test vision a little closer next session, noted head turn to the R to locate items at times.        If plan is discharge home, recommend the following:  Assist for transportation;Assistance with cooking/housework;A lot of help with walking and/or transfers;A lot of help with bathing/dressing/bathroom;Direct supervision/assist for medications management   Equipment Recommendations  None recommended by OT    Recommendations for Other Services      Precautions / Restrictions Precautions Precautions: Fall Recall of Precautions/Restrictions: Impaired Precaution/Restrictions Comments: seizure precs; watch BP/HR Restrictions Weight Bearing Restrictions Per Provider Order: No       Mobility Bed Mobility               General bed mobility comments: up in recliner    Transfers Overall transfer level: Needs assistance Equipment used: Rolling walker (2 wheels), 1 person hand held assist Transfers: Sit to/from Stand, Bed to chair/wheelchair/BSC Sit to Stand: Min assist     Step pivot transfers: Mod assist, Max assist           Balance Overall balance assessment: Needs  assistance Sitting-balance support: Feet supported Sitting balance-Leahy Scale: Fair   Postural control: Right lateral lean Standing balance support: Bilateral upper extremity supported, Reliant on assistive device for balance Standing balance-Leahy Scale: Zero Standing balance comment: constant backwards lean even with tactile and VC's                           ADL either performed or assessed with clinical judgement   ADL       Grooming: Moderate assistance;Standing Grooming Details (indicate cue type and reason): poor balance backwards.         Upper Body Dressing : Moderate assistance;Sitting   Lower Body Dressing: Maximal assistance;Sit to/from stand;Total assistance Lower Body Dressing Details (indicate cue type and reason): unable to take hands off RW, balance back Toilet Transfer: Moderate assistance;Maximal assistance;Regular Toilet;Ambulation;Rolling walker (2 wheels) Toilet Transfer Details (indicate cue type and reason): poor ability to manipulate RW                Extremity/Trunk Assessment Upper Extremity Assessment Upper Extremity Assessment: Right hand dominant RUE Deficits / Details: Ataxia/apraxia.  Slowed movements. RUE Sensation: WNL RUE Coordination: decreased fine motor;decreased gross motor LUE Deficits / Details: Increased tone, functional strength LUE Sensation: WNL LUE Coordination: decreased fine motor;decreased gross motor   Lower Extremity Assessment Lower Extremity Assessment: Defer to PT evaluation   Cervical / Trunk Assessment Cervical / Trunk Assessment: Normal    Vision Patient Visual Report: No change from baseline     Perception Perception Perception: Impaired Preception Impairment Details: Inattention/Neglect   Praxis  Praxis Praxis: Impaired Praxis Impairment Details: Initiation;Motor planning;Limb apraxia   Communication Communication Communication: Impaired Factors Affecting Communication: Reduced  clarity of speech;Difficulty expressing self   Cognition Arousal: Alert Behavior During Therapy: Flat affect Cognition: History of cognitive impairments                               Following commands: Impaired Following commands impaired: Follows one step commands with increased time      Cueing   Cueing Techniques: Verbal cues, Gestural cues, Tactile cues  Exercises      Shoulder Instructions       General Comments      Pertinent Vitals/ Pain       Pain Assessment Pain Assessment: No/denies pain Pain Intervention(s): Monitored during session                                                          Frequency  Min 2X/week        Progress Toward Goals  OT Goals(current goals can now be found in the care plan section)  Progress towards OT goals: Progressing toward goals  Acute Rehab OT Goals OT Goal Formulation: With patient Time For Goal Achievement: 09/02/23 Potential to Achieve Goals: Good  Plan      Co-evaluation                 AM-PAC OT 6 Clicks Daily Activity     Outcome Measure   Help from another person eating meals?: A Lot Help from another person taking care of personal grooming?: A Lot Help from another person toileting, which includes using toliet, bedpan, or urinal?: A Lot Help from another person bathing (including washing, rinsing, drying)?: A Lot Help from another person to put on and taking off regular upper body clothing?: A Lot Help from another person to put on and taking off regular lower body clothing?: A Lot 6 Click Score: 12    End of Session Equipment Utilized During Treatment: Gait belt;Rolling walker (2 wheels)  OT Visit Diagnosis: Unsteadiness on feet (R26.81);Other abnormalities of gait and mobility (R26.89);Muscle weakness (generalized) (M62.81);History of falling (Z91.81);Feeding difficulties (R63.3);Hemiplegia and hemiparesis;Other symptoms and signs involving the nervous  system (R29.898);Ataxia, unspecified (R27.0) Hemiplegia - Right/Left: Right Hemiplegia - dominant/non-dominant: Dominant Hemiplegia - caused by: Cerebral infarction   Activity Tolerance Patient tolerated treatment well   Patient Left in chair;with call bell/phone within reach;with chair alarm set   Nurse Communication Mobility status        Time: 9046-8985 OT Time Calculation (min): 21 min  Charges: OT General Charges $OT Visit: 1 Visit OT Treatments $Self Care/Home Management : 8-22 mins  08/22/2023  RP, OTR/L  Acute Rehabilitation Services  Office:  [phone]   [name] JONETTA Halsted 08/22/2023, 10:24 AM

## 2023-08-22 NOTE — Plan of Care (Signed)
   Problem: Education: Goal: Knowledge of General Education information will improve Description: Including pain rating scale, medication(s)/side effects and non-pharmacologic comfort measures Outcome: Completed/Met

## 2023-08-22 NOTE — Plan of Care (Signed)
  Problem: Clinical Measurements: Goal: Ability to maintain clinical measurements within normal limits will improve Outcome: Progressing   Problem: Clinical Measurements: Goal: Will remain free from infection Outcome: Progressing   Problem: Activity: Goal: Risk for activity intolerance will decrease Outcome: Progressing   Problem: Nutrition: Goal: Adequate nutrition will be maintained Outcome: Progressing   

## 2023-08-22 NOTE — Progress Notes (Signed)
 IP rehab admissions - Dr. Perri completed peer to peer with insurance medical director today.  Insurance has denied inpatient rehab admission.  Options now likely to be SNF placement if desired or home with [facility] therapies.  I will let husband know of denial.  [phone]

## 2023-08-22 NOTE — Progress Notes (Signed)
 PROGRESS NOTE    Jacqueline Bonilla  FMW:[id number] DOB: [birth year] DOA: 08/15/2023 PCP: Purcell Emil Schanz, MD  Chief Complaint  Patient presents with   Code Stroke    Brief Narrative:   Jacqueline Bonilla is an 68 y.o. female past medical history significant for CVA with right hemiparesis vascular dementia comes in initially as Jacqueline Bonilla code stroke as family noticed right facial droop and slurred speech, was found to have status epilepticus requiring mechanical ventilation, subsequently extubated on 08/17/2023   Significant Events 8/14 : Admitted 8/14: Intubated, EEG with status 8/16: extubated, EEG without seizures noted 8/18: hospitalists assumed care 8/21 stable for discharge, plan is for inpatient rehab  Assessment & Plan:   Principal Problem:   Seizure [facility]) Active Problems:   Essential hypertension   Malnutrition of moderate degree   Pressure injury of skin   Fever   Status epilepticus (HCC)  Status epilepticus/seizure (HCC) Suspected due to medication nonadherence Recently started on Depakote . Initially intubated now extubated, loaded with Keppra . Neurology was consulted, MRI of the brain showed no acute findings EEG on 8/14-15 showed status.  Recent EEG on 8/16 was without noted seizures (but cortical dysfunction in the L hemisphere due to underlying structural abnormality, post ictal state).   Neurology recommending continue keppra , outpatient neurology follow up PT has recommended acute rehab TOC on board with placement   Per [place]  DMV statutes, patients with seizures are not allowed to drive until  they have been seizure-free for six months. Use caution when using heavy equipment or power tools. Avoid working on ladders or at heights. Take showers instead of baths. Ensure the water temperature is not too high on the home water heater. Do not go swimming alone. When caring for infants or small children, sit down when holding, feeding, or changing them to  minimize risk of injury to the child in the event you have Flor Houdeshell seizure. Also, Maintain good sleep hygiene. Avoid alcohol.  Fever Fevers to 100.9 and 102.6 and 100.8 earlier in course (appears around 8/15-16) Blood cultures NG Fevers have resolved.  Will workup additionally as indicated.  No current si/sx infection.    Acute respiratory failure with hypoxia intubated for airway protection: Likely due to status epilepticus Currently on RA   Essential hypertension Continue to hold antihypertensive medication  blood pressure currently ok Will have to be resumed slowly as an outpatient.   Malnutrition of moderate degree Noted.   Pressure injury of skin Turn patient every 2 hours, out of bed to chair. Consult physical therapy     DVT prophylaxis: lovenox  Code Status: full Family Communication: none Disposition:   Status is: Inpatient Remains inpatient appropriate because: need for continued inpatinet care   Consultants:  Neurology PCCM  Procedures:  EEG  8/16 IMPRESSION: This study was suggestive of cortical dysfunction in left hemisphere likely secondary to underlying structural abnormality, postictal state. No seizures were noted  8/15 ABNORMALITY - Electrographic status epilepticus, left hemisphere - Continuous slow, generalized and lateralized left hemisphere   IMPRESSION: At the beginning of the study, EEG was consistent with electrographic status epilepticus arising from left hemisphere.  EEG was disconnected on 08/15/2023 at 1206 as patient was being transferred.  Subsequently when EEG was reconnected at 1241, status epilepticus had resolved.     EEG was then suggestive of cortical dysfunction in left hemisphere likely secondary to underlying structural abnormality, postictal state.  Additionally there was moderate diffuse encephalopathy.   Antimicrobials:  Anti-infectives (From admission, onward)  None       Subjective: No new complaints Asking to sit  up for breakfast (discussed with RN)  Objective: Vitals:   08/21/23 1612 08/21/23 2014 08/22/23 0433 08/22/23 0859  BP: 108/72 118/69 133/75 105/78  Pulse: (!) 104 97 84 (!) 104  Resp: 18 20 18 18   Temp:  98.6 F (37 C) 97.8 F (36.6 C) 98 F (36.7 C)  TempSrc:  Oral Oral Oral  SpO2: 99% 99% 99% 100%  Weight:      Height:        Intake/Output Summary (Last 24 hours) at 08/22/2023 0942 Last data filed at 08/22/2023 0900 Gross per 24 hour  Intake 960 ml  Output 200 ml  Net 760 ml   Filed Weights   08/16/23 0235 08/17/23 0500 08/20/23 1700  Weight: 51.3 kg 54.4 kg 52.8 kg    Examination:  General exam: Appears calm and comfortable  Respiratory system: Clear to auscultation. Respiratory effort normal. Cardiovascular system: S1 & S2 heard, RRR.  Gastrointestinal system: Abdomen is nondistended, soft and nontender.  Central nervous system: Alert and oriented. Chronic R sided weakness Extremities: no LEE   Data Reviewed: I have personally reviewed following labs and imaging studies  CBC: Recent Labs  Lab 08/15/23 1020 08/15/23 1025 08/17/23 0235 08/18/23 0435 08/19/23 0351 08/20/23 0516 08/22/23 0351  WBC 7.1   < > 7.7 9.3 7.3 5.0 5.8  NEUTROABS 4.6  --   --   --   --   --   --   HGB 13.3   < > 11.6* 11.6* 10.9* 11.9* 11.0*  HCT 41.0   < > 35.4* 36.0 33.7* 35.6* 34.0*  MCV 91.3   < > 90.3 91.4 91.1 88.3 90.4  PLT 212   < > 205 187 207 250 261   < > = values in this interval not displayed.    Basic Metabolic Panel: Recent Labs  Lab 08/16/23 0258 08/17/23 0235 08/18/23 0435 08/19/23 0351 08/20/23 0516 08/22/23 0351  NA 133* 132* 136 136 140 139  K 3.5 3.8 3.8 4.0 3.7 4.1  CL 100 101 103 103 105 103  CO2 21* 20* 23 24 25 24   GLUCOSE 122* 173* 113* 103* 113* 106*  BUN 13 14 12 16 13 20   CREATININE 0.80 0.79 0.76 0.71 0.68 0.70  CALCIUM  8.9 8.8* 8.8* 8.8* 9.3 8.9  MG 2.0 1.9 1.8 1.7 1.8  --   PHOS 3.7 4.0  4.0 3.6 3.7 4.0  --     GFR: Estimated  Creatinine Clearance: 54.5 mL/min (by C-G formula based on SCr of 0.7 mg/dL).  Liver Function Tests: Recent Labs  Lab 08/15/23 1020 08/17/23 0235  AST 23  --   ALT 13  --   ALKPHOS 53  --   BILITOT 0.4  --   PROT 7.8  --   ALBUMIN 3.7 3.1*    CBG: Recent Labs  Lab 08/20/23 1656 08/21/23 0839 08/21/23 1128 08/21/23 1611 08/22/23 0742  GLUCAP 177* 136* 133* 126* 85     Recent Results (from the past 240 hours)  MRSA Next Gen by PCR, Nasal     Status: None   Collection Time: 08/15/23 12:39 PM   Specimen: Nasal Mucosa; Nasal Swab  Result Value Ref Range Status   MRSA by PCR Next Gen NOT DETECTED NOT DETECTED Final    Comment: (NOTE) The GeneXpert MRSA Assay (FDA approved for NASAL specimens only), is one component of Lenoard Helbert comprehensive MRSA colonization surveillance program.  It is not intended to diagnose MRSA infection nor to guide or monitor treatment for MRSA infections. Test performance is not FDA approved in patients less than 75 years old. Performed at [facility] Lab, [address], [place], [name] [address]   Culture, blood (Routine X 2) w Reflex to ID Panel     Status: None   Collection Time: 08/17/23  2:35 AM   Specimen: BLOOD LEFT HAND  Result Value Ref Range Status   Specimen Description BLOOD LEFT HAND  Final   Special Requests   Final    BOTTLES DRAWN AEROBIC AND ANAEROBIC Blood Culture results may not be optimal due to an inadequate volume of blood received in culture bottles   Culture   Final    NO GROWTH 5 DAYS Performed at [facility], [address], [place], [name] [address]    Report Status 08/22/2023 FINAL  Final  Culture, blood (Routine X 2) w Reflex to ID Panel     Status: None   Collection Time: 08/17/23  2:38 AM   Specimen: BLOOD LEFT ARM  Result Value Ref Range Status   Specimen Description BLOOD LEFT ARM  Final   Special Requests   Final    BOTTLES DRAWN AEROBIC AND ANAEROBIC Blood Culture results may not be optimal due to  an inadequate volume of blood received in culture bottles   Culture   Final    NO GROWTH 5 DAYS Performed at [facility] Lab, [address], [place], [name] [address]    Report Status 08/22/2023 FINAL  Final         Radiology Studies: No results found.      Scheduled Meds:  aspirin   81 mg Oral Daily   atorvastatin   40 mg Oral Daily   Chlorhexidine  Gluconate Cloth  6 each Topical Daily   enoxaparin  (LOVENOX ) injection  40 mg Subcutaneous Q24H   feeding supplement  237 mL Oral TID BM   insulin  aspart  0-9 Units Subcutaneous BID   levETIRAcetam   1,000 mg Oral BID   multivitamin with minerals  1 tablet Oral Daily   mupirocin  ointment   Topical BID   thiamine   100 mg Oral Q24H   Continuous Infusions:   LOS: 7 days    Time spent: over 30 min     Meliton Monte, MD [facility]   To contact the attending provider between 7A-7P or the covering provider during after hours 7P-7A, please log into the web site [email or web address] and access using universal [place] password for that web site. If you do not have the password, please call the hospital operator.  08/22/2023, 9:42 AM

## 2023-08-23 ENCOUNTER — Other Ambulatory Visit: Payer: Self-pay | Admitting: Emergency Medicine

## 2023-08-23 DIAGNOSIS — R569 Unspecified convulsions: Secondary | ICD-10-CM | POA: Diagnosis not present

## 2023-08-23 LAB — GLUCOSE, CAPILLARY: Glucose-Capillary: 94 mg/dL (ref 70–99)

## 2023-08-23 NOTE — Plan of Care (Signed)
  Problem: Health Behavior/Discharge Planning: Goal: Ability to manage health-related needs will improve Outcome: Progressing   Problem: Clinical Measurements: Goal: Ability to maintain clinical measurements within normal limits will improve Outcome: Progressing Goal: Will remain free from infection Outcome: Progressing Goal: Diagnostic test results will improve Outcome: Progressing Goal: Respiratory complications will improve Outcome: Progressing Goal: Cardiovascular complication will be avoided Outcome: Progressing   Problem: Activity: Goal: Risk for activity intolerance will decrease Outcome: Progressing   Problem: Nutrition: Goal: Adequate nutrition will be maintained Outcome: Progressing   Problem: Coping: Goal: Level of anxiety will decrease Outcome: Progressing   Problem: Elimination: Goal: Will not experience complications related to bowel motility Outcome: Progressing Goal: Will not experience complications related to urinary retention Outcome: Progressing   Problem: Pain Managment: Goal: General experience of comfort will improve and/or be controlled Outcome: Progressing   Problem: Safety: Goal: Ability to remain free from injury will improve Outcome: Progressing   Problem: Skin Integrity: Goal: Risk for impaired skin integrity will decrease Outcome: Progressing   Problem: Safety: Goal: Non-violent Restraint(s) Outcome: Progressing   Problem: Education: Goal: Ability to describe self-care measures that may prevent or decrease complications (Diabetes Survival Skills Education) will improve Outcome: Progressing Goal: Individualized Educational Video(s) Outcome: Progressing   Problem: Coping: Goal: Ability to adjust to condition or change in health will improve Outcome: Progressing   Problem: Fluid Volume: Goal: Ability to maintain a balanced intake and output will improve Outcome: Progressing   Problem: Health Behavior/Discharge Planning: Goal:  Ability to identify and utilize available resources and services will improve Outcome: Progressing Goal: Ability to manage health-related needs will improve Outcome: Progressing   Problem: Metabolic: Goal: Ability to maintain appropriate glucose levels will improve Outcome: Progressing   Problem: Nutritional: Goal: Maintenance of adequate nutrition will improve Outcome: Progressing Goal: Progress toward achieving an optimal weight will improve Outcome: Progressing   Problem: Skin Integrity: Goal: Risk for impaired skin integrity will decrease Outcome: Progressing   Problem: Tissue Perfusion: Goal: Adequacy of tissue perfusion will improve Outcome: Progressing

## 2023-08-23 NOTE — Progress Notes (Signed)
 PROGRESS NOTE    Jacqueline Bonilla  FMW:[id number] DOB: 09-25-1955 DOA: 08/15/2023 PCP: Purcell Emil Schanz, MD  Chief Complaint  Patient presents with   Code Stroke    Brief Narrative:   Jacqueline Bonilla is an 68 y.o. female past medical history significant for CVA with right hemiparesis vascular dementia comes in initially as Admiral Marcucci code stroke as family noticed right facial droop and slurred speech, was found to have status epilepticus requiring mechanical ventilation, subsequently extubated on 08/17/2023   Significant Events 8/14 : Admitted 8/14: Intubated, EEG with status 8/16: extubated, EEG without seizures noted 8/18: hospitalists assumed care 8/21 stable for discharge, plan is for inpatient rehab - peer to peer for inpatient rehab declined 8/22 plan for SNF   Assessment & Plan:   Principal Problem:   Seizure [facility]) Active Problems:   Essential hypertension   Malnutrition of moderate degree   Pressure injury of skin   Fever   Status epilepticus (HCC)  Status epilepticus/seizure (HCC) Suspected due to medication nonadherence Recently started on Depakote . Initially intubated now extubated, loaded with Keppra . Neurology was consulted, MRI of the brain showed no acute findings EEG on 8/14-15 showed status.  Recent EEG on 8/16 was without noted seizures (but cortical dysfunction in the L hemisphere due to underlying structural abnormality, post ictal state).   Neurology recommending continue keppra , outpatient neurology follow up PT has recommended acute rehab TOC on board with placement - working on SNF   Per [place]  DMV statutes, patients with seizures are not allowed to drive until  they have been seizure-free for six months. Use caution when using heavy equipment or power tools. Avoid working on ladders or at heights. Take showers instead of baths. Ensure the water temperature is not too high on the home water heater. Do not go swimming alone. When caring for  infants or small children, sit down when holding, feeding, or changing them to minimize risk of injury to the child in the event you have Felesha Moncrieffe seizure. Also, Maintain good sleep hygiene. Avoid alcohol.  Fever Fevers to 100.9 and 102.6 and 100.8 earlier in course (appears around 8/15-16) Blood cultures NG Fevers have resolved.  Will workup additionally as indicated.  No current si/sx infection.    Acute respiratory failure with hypoxia intubated for airway protection: Likely due to status epilepticus Currently on RA   Essential hypertension Continue to hold antihypertensive medication  blood pressure currently ok Will have to be resumed slowly as an outpatient.   Malnutrition of moderate degree Noted.   Pressure injury of skin Turn patient every 2 hours, out of bed to chair. Consult physical therapy     DVT prophylaxis: lovenox  Code Status: full Family Communication: none Disposition:   Status is: Inpatient Remains inpatient appropriate because: need for continued inpatinet care   Consultants:  Neurology PCCM  Procedures:  EEG  8/16 IMPRESSION: This study was suggestive of cortical dysfunction in left hemisphere likely secondary to underlying structural abnormality, postictal state. No seizures were noted  8/15 ABNORMALITY - Electrographic status epilepticus, left hemisphere - Continuous slow, generalized and lateralized left hemisphere   IMPRESSION: At the beginning of the study, EEG was consistent with electrographic status epilepticus arising from left hemisphere.  EEG was disconnected on 08/15/2023 at 1206 as patient was being transferred.  Subsequently when EEG was reconnected at 1241, status epilepticus had resolved.     EEG was then suggestive of cortical dysfunction in left hemisphere likely secondary to underlying structural abnormality, postictal state.  Additionally there was moderate diffuse encephalopathy.   Antimicrobials:  Anti-infectives (From  admission, onward)    None       Subjective: No complaints  Objective: Vitals:   08/22/23 0859 08/22/23 2121 08/23/23 0421 08/23/23 0728  BP: 105/78 134/83 134/81 133/74  Pulse: (!) 104 77 85 69  Resp: 18 18 20 18   Temp: 98 F (36.7 C) 98 F (36.7 C) 98.2 F (36.8 C) 98.5 F (36.9 C)  TempSrc: Oral Oral Oral   SpO2: 100% 100% 98% 97%  Weight:      Height:        Intake/Output Summary (Last 24 hours) at 08/23/2023 1453 Last data filed at 08/23/2023 0600 Gross per 24 hour  Intake 240 ml  Output 600 ml  Net -360 ml   Filed Weights   08/16/23 0235 08/17/23 0500 08/20/23 1700  Weight: 51.3 kg 54.4 kg 52.8 kg    Examination:  General: No acute distress. Cardiovascular: RRR Lungs: unlabored Abdomen: Soft, nontender, nondistended  Neurological: Alert and oriented. Moves all extremities 4 with equal strength. Cranial nerves II through XII grossly intact. Extremities: No clubbing or cyanosis. No edema.   Data Reviewed: I have personally reviewed following labs and imaging studies  CBC: Recent Labs  Lab 08/17/23 0235 08/18/23 0435 08/19/23 0351 08/20/23 0516 08/22/23 0351  WBC 7.7 9.3 7.3 5.0 5.8  HGB 11.6* 11.6* 10.9* 11.9* 11.0*  HCT 35.4* 36.0 33.7* 35.6* 34.0*  MCV 90.3 91.4 91.1 88.3 90.4  PLT 205 187 207 250 261    Basic Metabolic Panel: Recent Labs  Lab 08/17/23 0235 08/18/23 0435 08/19/23 0351 08/20/23 0516 08/22/23 0351  NA 132* 136 136 140 139  K 3.8 3.8 4.0 3.7 4.1  CL 101 103 103 105 103  CO2 20* 23 24 25 24   GLUCOSE 173* 113* 103* 113* 106*  BUN 14 12 16 13 20   CREATININE 0.79 0.76 0.71 0.68 0.70  CALCIUM  8.8* 8.8* 8.8* 9.3 8.9  MG 1.9 1.8 1.7 1.8  --   PHOS 4.0  4.0 3.6 3.7 4.0  --     GFR: Estimated Creatinine Clearance: 54.5 mL/min (by C-G formula based on SCr of 0.7 mg/dL).  Liver Function Tests: Recent Labs  Lab 08/17/23 0235  ALBUMIN 3.1*    CBG: Recent Labs  Lab 08/22/23 0742 08/22/23 1141 08/22/23 1707  08/22/23 2122 08/23/23 0729  GLUCAP 85 93 89 84 94     Recent Results (from the past 240 hours)  MRSA Next Gen by PCR, Nasal     Status: None   Collection Time: 08/15/23 12:39 PM   Specimen: Nasal Mucosa; Nasal Swab  Result Value Ref Range Status   MRSA by PCR Next Gen NOT DETECTED NOT DETECTED Final    Comment: (NOTE) The GeneXpert MRSA Assay (FDA approved for NASAL specimens only), is one component of Jorie Zee comprehensive MRSA colonization surveillance program. It is not intended to diagnose MRSA infection nor to guide or monitor treatment for MRSA infections. Test performance is not FDA approved in patients less than 78 years old. Performed at [facility], [address] [place], [name] [address]   Culture, blood (Routine X 2) w Reflex to ID Panel     Status: None   Collection Time: 08/17/23  2:35 AM   Specimen: BLOOD LEFT HAND  Result Value Ref Range Status   Specimen Description BLOOD LEFT HAND  Final   Special Requests   Final    BOTTLES DRAWN AEROBIC AND  ANAEROBIC Blood Culture results may not be optimal due to an inadequate volume of blood received in culture bottles   Culture   Final    NO GROWTH 5 DAYS Performed at [facility], [address], [place], [name] [address]    Report Status 08/22/2023 FINAL  Final  Culture, blood (Routine X 2) w Reflex to ID Panel     Status: None   Collection Time: 08/17/23  2:38 AM   Specimen: BLOOD LEFT ARM  Result Value Ref Range Status   Specimen Description BLOOD LEFT ARM  Final   Special Requests   Final    BOTTLES DRAWN AEROBIC AND ANAEROBIC Blood Culture results may not be optimal due to an inadequate volume of blood received in culture bottles   Culture   Final    NO GROWTH 5 DAYS Performed at [facility], [address], [place], [name] [address]    Report Status 08/22/2023 FINAL  Final         Radiology Studies: CUP PACEART REMOTE DEVICE CHECK Result Date: 08/22/2023 ILR summary report  received. Battery status OK. Normal device function. No new symptom, tachy, brady, or pause episodes. No new AF episodes. Monthly summary reports and ROV/PRN LA, CVRS       Scheduled Meds:  aspirin   81 mg Oral Daily   atorvastatin   40 mg Oral Daily   Chlorhexidine  Gluconate Cloth  6 each Topical Daily   enoxaparin  (LOVENOX ) injection  40 mg Subcutaneous Q24H   feeding supplement  237 mL Oral TID BM   insulin  aspart  0-9 Units Subcutaneous BID   levETIRAcetam   1,000 mg Oral BID   multivitamin with minerals  1 tablet Oral Daily   mupirocin  ointment   Topical BID   thiamine   100 mg Oral Q24H   Continuous Infusions:   LOS: 8 days    Time spent: over 30 min     Meliton Monte, MD [facility]   To contact the attending provider between 7A-7P or the covering provider during after hours 7P-7A, please log into the web site [email or web address] and access using universal [place] password for that web site. If you do not have the password, please call the hospital operator.  08/23/2023, 2:53 PM

## 2023-08-23 NOTE — [place] FL2 (Signed)
 [place]  MEDICAID FL2 LEVEL OF CARE FORM     IDENTIFICATION  Patient Name: Jacqueline Bonilla Birthdate: 1955/11/17 Sex: female Admission Date (Current Location): 08/15/2023  [facility] and [place] Number:  Producer, television/film/video and Address:  [facility] [place] [address], [place], [name] [address]      Provider Number: [id number]  Attending Physician Name and Address:  Perri DELENA Meliton Mickey., *  Relative Name and Phone Number:  Rafaelita, Foister (Spouse)  [phone]    Current Level of Care: Hospital Recommended Level of Care: Skilled Nursing Facility Prior Approval Number:    Date Approved/Denied:   PASRR Number: [id number]  Discharge Plan: SNF    Current Diagnoses: Patient Active Problem List   Diagnosis Date Noted   Pressure injury of skin 08/19/2023   Fever 08/19/2023   Status epilepticus (HCC) 08/19/2023   Malnutrition of moderate degree 08/16/2023   Seizure (HCC) 08/15/2023   Injury of left ankle 07/25/2023   Gait disturbance, 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   Hemiparesis affecting right side as late effect of cerebrovascular accident (CVA) (HCC) 04/23/2023   Apraxia, 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   Frequent falls 02/11/2023   Statin intolerance 04/24/2022   Internal hemorrhoids 01/10/2021   Dyslipidemia 12/29/2020   Seizure-like activity (HCC) 08/30/2020   History of CVA (cerebrovascular accident) 07/14/2020   Normocytic anemia 07/11/2020   Vascular dementia without behavioral disturbance (HCC) 07/15/2019   History of COVID-19 04/22/2019   Essential hypertension 03/03/2019    Orientation RESPIRATION BLADDER Height & Weight     Self  Normal Continent Weight: 116 lb 8 oz (52.8 kg) Height:  5' 4 (162.6 cm)  BEHAVIORAL SYMPTOMS/MOOD NEUROLOGICAL BOWEL NUTRITION STATUS    Convulsions/Seizures (Seizures) Continent Diet (see dc summary)  AMBULATORY STATUS COMMUNICATION OF NEEDS Skin   Limited Assist Verbally Other (Comment) (ecchymosis)                        Personal Care Assistance Level of Assistance  Bathing, Feeding, Dressing Bathing Assistance: Maximum assistance Feeding assistance: Limited assistance Dressing Assistance: Maximum assistance     Functional Limitations Info  Sight, Hearing, Speech Sight Info: Impaired Hearing Info: Adequate Speech Info: Adequate    SPECIAL CARE FACTORS FREQUENCY  PT (By licensed PT), OT (By licensed OT)     PT Frequency: 5x a week OT Frequency: 5x a week            Contractures Contractures Info: Not present    Additional Factors Info  Code Status, Allergies, Insulin  Sliding Scale Code Status Info: FULL Allergies Info: Iodinated Contrast Media  Iohexol   Plavix  (Clopidogrel )  Quinolones  Shellfish Allergy   Insulin  Sliding Scale Info: Novolog        Current Medications (08/23/2023):  This is the current hospital active medication list Current Facility-Administered Medications  Medication Dose Route Frequency Provider Last Rate Last Admin   acetaminophen  (TYLENOL ) tablet 650 mg  650 mg Oral Q6H PRN Alghanim, Fahid, MD   650 mg at 08/17/23 1306   aspirin  chewable tablet 81 mg  81 mg Oral Daily Alghanim, Fahid, MD   81 mg at 08/23/23 9167   atorvastatin  (LIPITOR) tablet 40 mg  40 mg Oral Daily Alghanim, Fahid, MD   40 mg at 08/23/23 9167   Chlorhexidine  Gluconate Cloth 2 % PADS 6 each  6 each Topical Daily Gleason, Laura R, PA-C   6 each at 08/22/23 1100   docusate (COLACE) 50 MG/5ML liquid 100 mg  100 mg Oral  BID PRN Alghanim, Fahid, MD       enoxaparin  (LOVENOX ) injection 40 mg  40 mg Subcutaneous Q24H Alghanim, Fahid, MD   40 mg at 08/22/23 1500   feeding supplement (ENSURE PLUS HIGH PROTEIN) liquid 237 mL  237 mL Oral TID BM Odell Celinda Balo, MD   237 mL at 08/23/23 9167   insulin  aspart (novoLOG ) injection 0-9 Units  0-9 Units Subcutaneous BID Amoako, Prince, MD   1 Units at 08/21/23 1750   levETIRAcetam  (KEPPRA ) tablet 1,000 mg  1,000 mg Oral BID Alghanim, Fahid, MD    1,000 mg at 08/23/23 0851   multivitamin with minerals tablet 1 tablet  1 tablet Oral Daily Alghanim, Fahid, MD   1 tablet at 08/23/23 9167   mupirocin  ointment (BACTROBAN ) 2 %   Topical BID Alghanim, Paula, MD   Given at 08/23/23 9153   Oral care mouth rinse  15 mL Mouth Rinse PRN Alghanim, Fahid, MD       polyethylene glycol (MIRALAX  / GLYCOLAX ) packet 17 g  17 g Oral Daily PRN Alghanim, Fahid, MD       thiamine  (VITAMIN B1) tablet 100 mg  100 mg Oral Q24H Alghanim, Fahid, MD   100 mg at 08/23/23 9167     Discharge Medications: Please see discharge summary for a list of discharge medications.  Relevant Imaging Results:  Relevant Lab Results:   Additional Information SS#: [id number]  Lendia Dais, LCSWA

## 2023-08-23 NOTE — Progress Notes (Signed)
 Physical Therapy Treatment Patient Details Name: Jacqueline Bonilla MRN: [id number] DOB: 07-03-1955 Today's Date: 08/23/2023   History of Present Illness Patient is a 68 year old female coming in initially as code stroke as family noted right facial droop, slurred speech, found to have status epilepticus. Was intubated and extubated on 08/17/23. PMH: CVA with right hemiparesis, vascular dementia.    PT Comments  Pt seen for PT tx with pt agreeable. Pt is able to ambulate in room, bathroom & hallway with RUE HHA & min assist. Pt toilets with continent void, requires cuing to use grab bar to assist with toilet transfer, cuing to turn off water when finished with hand hygiene. During gait in hallway, pt engaged in scanning to R without cuing to do so, to locate various objects. Pt does present with decreased attention to R side of body, decreased overall awareness, & decreased safety awareness. Pt does endorse fatigue after walking 120 ft so rest break taken PRN. Recommend ongoing PT services to progress mobility as able, reduce fall risk.    If plan is discharge home, recommend the following: A little help with walking and/or transfers;A little help with bathing/dressing/bathroom;Assist for transportation;Help with stairs or ramp for entrance;Assistance with cooking/housework;Direct supervision/assist for medications management;Direct supervision/assist for financial management   Can travel by private vehicle        Equipment Recommendations  None recommended by PT    Recommendations for Other Services Rehab consult     Precautions / Restrictions Precautions Precautions: Fall Recall of Precautions/Restrictions: Impaired Precaution/Restrictions Comments: R inattention Restrictions Weight Bearing Restrictions Per Provider Order: No     Mobility  Bed Mobility Overal bed mobility: Needs Assistance Bed Mobility: Supine to Sit     Supine to sit: HOB elevated, Used rails, Min assist      General bed mobility comments: able to come to sitting EOB but requires assistance to scoot L hip to EOB despite cuing    Transfers Overall transfer level: Needs assistance Equipment used: 1 person hand held assist Transfers: Sit to/from Stand Sit to Stand: Min assist           General transfer comment: cuing re: hand placement, min assist for sit>stand from EOB, low toilet, standard chair with armrests. Pt uses LUE to push to standing but not RUE.    Ambulation/Gait Ambulation/Gait assistance: Min assist Gait Distance (Feet): 120 Feet (120 ft) Assistive device: 1 person hand held assist Gait Pattern/deviations: Decreased step length - right, Decreased step length - left, Decreased dorsiflexion - right, Decreased stride length, Decreased dorsiflexion - left, Shuffle Gait velocity: variable     General Gait Details: cuing with fair return demo of increased step length, pt with decreased heel strike BLE   Stairs             Wheelchair Mobility     Tilt Bed    Modified Rankin (Stroke Patients Only)       Balance Overall balance assessment: Needs assistance Sitting-balance support: Feet supported Sitting balance-Leahy Scale: Fair Sitting balance - Comments: supervision static sitting on toilet   Standing balance support: No upper extremity supported, During functional activity Standing balance-Leahy Scale: Poor Standing balance comment: min assist for balance when standing to perform peri hygiene                            Communication Communication Communication: Impaired Factors Affecting Communication: Reduced clarity of speech;Difficulty expressing self  Cognition Arousal: Alert  Behavior During Therapy: Flat affect   PT - Cognitive impairments: No family/caregiver present to determine baseline, Orientation, Awareness, Memory, Attention, Sequencing, Problem solving, Safety/Judgement                       PT - Cognition Comments:  decreased attention to R side, decreased awareness, decreased problem solving Following commands: Impaired Following commands impaired: Follows one step commands with increased time    Cueing    Exercises      General Comments General comments (skin integrity, edema, etc.): Pt with continent void, requires cuing to turn off water after hand hygiene at sink.      Pertinent Vitals/Pain Pain Assessment Pain Assessment: No/denies pain    Home Living                          Prior Function            PT Goals (current goals can now be found in the care plan section) Acute Rehab PT Goals Patient Stated Goal: to walk PT Goal Formulation: With patient Time For Goal Achievement: 09/02/23 Potential to Achieve Goals: Good Progress towards PT goals: Progressing toward goals    Frequency    Min 3X/week      PT Plan      Co-evaluation              AM-PAC PT 6 Clicks Mobility   Outcome Measure  Help needed turning from your back to your side while in a flat bed without using bedrails?: A Little Help needed moving from lying on your back to sitting on the side of a flat bed without using bedrails?: A Little Help needed moving to and from a bed to a chair (including a wheelchair)?: A Little Help needed standing up from a chair using your arms (e.g., wheelchair or bedside chair)?: A Little Help needed to walk in hospital room?: A Little Help needed climbing 3-5 steps with a railing? : A Lot 6 Click Score: 17    End of Session Equipment Utilized During Treatment: Gait belt Activity Tolerance: Patient tolerated treatment well Patient left: in chair;with chair alarm set;with call bell/phone within reach   PT Visit Diagnosis: Muscle weakness (generalized) (M62.81);Unsteadiness on feet (R26.81);Other abnormalities of gait and mobility (R26.89)     Time: 1040-1056 PT Time Calculation (min) (ACUTE ONLY): 16 min  Charges:    $Therapeutic Activity: 8-22  mins PT General Charges $$ ACUTE PT VISIT: 1 Visit                     Richerd Pinal, PT, DPT 08/23/23, 12:20 PM    Richerd CHRISTELLA Pinal 08/23/2023, 12:19 PM

## 2023-08-23 NOTE — TOC Progression Note (Signed)
 Transition of Care [facility]) - Progression Note    Patient Details  Name: Jacqueline Bonilla MRN: [id number] Date of Birth: Jul 31, 1955  Transition of Care [facility]) CM/SW Contact  Lendia Dais, [place] Phone Number: 08/23/2023, 2:48 PM  Clinical Narrative:  CSW spoke with husband [name] via phone about PT recs. Pt is disoriented x3.  CSW informed patient that CIR has declined the patient and that the next option would be a skilled nursing facility. [name] stated understanding and asked if the conversation could be continued tomorrow. CSW agreed and will follow up tomorrow.                      Expected Discharge Plan and Services                                               Social Drivers of Health (SDOH) Interventions SDOH Screenings   Food Insecurity: No Food Insecurity (08/17/2023)  Housing: Low Risk  (08/17/2023)  Transportation Needs: No Transportation Needs (08/17/2023)  Utilities: At Risk (08/17/2023)  Alcohol Screen: Low Risk  (10/23/2022)  Depression (PHQ2-9): Low Risk  (07/24/2023)  Financial Resource Strain: Low Risk  (10/23/2022)  Physical Activity: Sufficiently Active (10/23/2022)  Social Connections: Moderately Isolated (08/17/2023)  Stress: No Stress Concern Present (08/28/2021)  Tobacco Use: Low Risk  (08/15/2023)  Health Literacy: Inadequate Health Literacy (10/23/2022)    Readmission Risk Interventions     No data to display

## 2023-08-23 NOTE — Progress Notes (Signed)
 Mobility Specialist Progress Note:    08/23/23 1437  Mobility  Activity Ambulated with assistance  Level of Assistance Minimal assist, patient does 75% or more  Assistive Device Other (Comment) (HHA)  Distance Ambulated (ft) 80 ft  Activity Response Tolerated well  Mobility Referral Yes  Mobility visit 1 Mobility  Mobility Specialist Start Time (ACUTE ONLY) 1400  Mobility Specialist Stop Time (ACUTE ONLY) 1423  Mobility Specialist Time Calculation (min) (ACUTE ONLY) 23 min   Pt received in chair agreeable to mobility. Pt required MinA for STS and during ambulation. During ambulation pt used HHA and the use of the hand rails in the halls. Pt required min VC for redirection. Distance limited d/t fatigue. Returned to room w/o fault. Left in chair w/ chair alarm on and husband in room.  Thersia Minder Mobility Specialist  Please contact vis Secure Chat or  Rehab Office [phone]

## 2023-08-23 NOTE — TOC Progression Note (Signed)
 Transition of Care [facility]) - Progression Note    Patient Details  Name: Jacqueline Bonilla MRN: [id number] Date of Birth: September 26, 1955  Transition of Care [facility]) CM/SW Contact  Lendia Dais, [place] Phone Number: 08/23/2023, 2:59 PM  Clinical Narrative:  CSW spoke to the pt's husband [name] via phone. Pt is disoriented times three.  CSW informed [name] of PT recs for a skilled nursing facility and asked if that would something he would be open to. [name] was agreeable and mentioned that patient was previously at [facility]. Patient's husband had a preference [name], [facility], [facility] and [facility].   CSW asked for permission to send out referrals for SNF and [name] was agreeable.  CSW spoke to [place] at [facility] and stated that because the pt was previously at [name] for 12 days starting July 13th 2025 and that she had not had a 60 day spell of wellness, there would be some copays associated with her stay. CSW informed [name] and he stated that he was okay with any copays associated with the stay at [facility].   CSW started insurance auth which is pending. Auth ID is [id number]. CSW informed [place] at [facility] pending and stated that they could take the pt over the weekend. Pt is medically stable per MD.  ICM will continue to follow for discharge.                      Expected Discharge Plan and Services                                               Social Drivers of Health (SDOH) Interventions SDOH Screenings   Food Insecurity: No Food Insecurity (08/17/2023)  Housing: Low Risk  (08/17/2023)  Transportation Needs: No Transportation Needs (08/17/2023)  Utilities: At Risk (08/17/2023)  Alcohol Screen: Low Risk  (10/23/2022)  Depression (PHQ2-9): Low Risk  (07/24/2023)  Financial Resource Strain: Low Risk  (10/23/2022)  Physical Activity: Sufficiently Active (10/23/2022)  Social Connections: Moderately Isolated  (08/17/2023)  Stress: No Stress Concern Present (08/28/2021)  Tobacco Use: Low Risk  (08/15/2023)  Health Literacy: Inadequate Health Literacy (10/23/2022)    Readmission Risk Interventions     No data to display

## 2023-08-24 DIAGNOSIS — R569 Unspecified convulsions: Secondary | ICD-10-CM | POA: Diagnosis not present

## 2023-08-24 LAB — GLUCOSE, CAPILLARY
Glucose-Capillary: 82 mg/dL (ref 70–99)
Glucose-Capillary: 94 mg/dL (ref 70–99)

## 2023-08-24 NOTE — Progress Notes (Signed)
 Patient escorted of unit, per orders, in wheelchair with husband chuck at this time

## 2023-08-24 NOTE — Plan of Care (Signed)
 Jacqueline Bonilla remained pleasant today. She was up to her chair, the restroom, walking amongst the unit and wheeled outside today. She utilized her bilateral arms for eating and shifting in the chair. She had a pleasant day.

## 2023-08-24 NOTE — Progress Notes (Signed)
 PROGRESS NOTE    [name]  FMW:[id number] DOB: [birth year] DOA: 08/15/2023 PCP: [facility] Emil Schanz, MD  Chief Complaint  Patient presents with   Code Stroke    Brief Narrative:   Jacqueline Bonilla is an 68 y.o. female past medical history significant for CVA with right hemiparesis vascular dementia comes in initially as Dreshon Proffit code stroke as family noticed right facial droop and slurred speech, was found to have status epilepticus requiring mechanical ventilation, subsequently extubated on 08/17/2023   Significant Events 8/14 : Admitted 8/14: Intubated, EEG with status 8/16: extubated, EEG without seizures noted 8/18: hospitalists assumed care 8/21 stable for discharge, plan is for inpatient rehab - peer to peer for inpatient rehab declined 8/22 plan for SNF   Assessment & Plan:   Principal Problem:   Seizure [facility]) Active Problems:   Essential hypertension   Malnutrition of moderate degree   Pressure injury of skin   Fever   Status epilepticus (HCC)  Status epilepticus/seizure (HCC) Suspected due to medication nonadherence Recently started on Depakote . Initially intubated now extubated, loaded with Keppra . Neurology was consulted, MRI of the brain showed no acute findings EEG on 8/14-15 showed status.  Recent EEG on 8/16 was without noted seizures (but cortical dysfunction in the L hemisphere due to underlying structural abnormality, post ictal state).   Neurology recommending continue keppra , outpatient neurology follow up PT has recommended acute rehab TOC on board with placement - working on SNF   Per [place]  DMV statutes, patients with seizures are not allowed to drive until  they have been seizure-free for six months. Use caution when using heavy equipment or power tools. Avoid working on ladders or at heights. Take showers instead of baths. Ensure the water temperature is not too high on the home water heater. Do not go swimming alone. When caring for  infants or small children, sit down when holding, feeding, or changing them to minimize risk of injury to the child in the event you have Latrelle Fuston seizure. Also, Maintain good sleep hygiene. Avoid alcohol.  Fever Fevers to 100.9 and 102.6 and 100.8 earlier in course (appears around 8/15-16) Blood cultures NG Fevers have resolved.  Will workup additionally as indicated.  No current si/sx infection.    Acute respiratory failure with hypoxia intubated for airway protection: Likely due to status epilepticus Currently on RA   Essential hypertension Continue to hold antihypertensive medication  blood pressure currently ok Consider resumption of antihypertensives gradually outpatient    Malnutrition of moderate degree Noted.   Hx CVA R sided weakness  Pressure injury of skin Turn patient every 2 hours, out of bed to chair. Consult physical therapy     DVT prophylaxis: lovenox  Code Status: full Family Communication: none Disposition:   Status is: Inpatient Remains inpatient appropriate because: need for continued inpatinet care   Consultants:  Neurology PCCM  Procedures:  EEG  8/16 IMPRESSION: This study was suggestive of cortical dysfunction in left hemisphere likely secondary to underlying structural abnormality, postictal state. No seizures were noted  8/15 ABNORMALITY - Electrographic status epilepticus, left hemisphere - Continuous slow, generalized and lateralized left hemisphere   IMPRESSION: At the beginning of the study, EEG was consistent with electrographic status epilepticus arising from left hemisphere.  EEG was disconnected on 08/15/2023 at 1206 as patient was being transferred.  Subsequently when EEG was reconnected at 1241, status epilepticus had resolved.     EEG was then suggestive of cortical dysfunction in left hemisphere likely secondary to underlying  structural abnormality, postictal state.  Additionally there was moderate diffuse  encephalopathy.   Antimicrobials:  Anti-infectives (From admission, onward)    None       Subjective: No complaints Asking to get up to chair  Objective: Vitals:   08/23/23 2212 08/24/23 0500 08/24/23 0541 08/24/23 0713  BP: 119/69  (!) 148/84 (!) 144/75  Pulse: 82  77 68  Resp: 18  18 18   Temp: 98 F (36.7 C)  98.4 F (36.9 C)   TempSrc: Oral  Oral   SpO2: 100%  97% 100%  Weight:  49.4 kg    Height:        Intake/Output Summary (Last 24 hours) at 08/24/2023 0809 Last data filed at 08/23/2023 2115 Gross per 24 hour  Intake 120 ml  Output --  Net 120 ml   Filed Weights   08/17/23 0500 08/20/23 1700 08/24/23 0500  Weight: 54.4 kg 52.8 kg 49.4 kg    Examination:  General: No acute distress. Cardiovascular: RRR Lungs: unlabored Abdomen: Soft, nontender, nondistended  Neurological: chronic R sided weakness Extremities: No clubbing or cyanosis. No edema.  Data Reviewed: I have personally reviewed following labs and imaging studies  CBC: Recent Labs  Lab 08/18/23 0435 08/19/23 0351 08/20/23 0516 08/22/23 0351  WBC 9.3 7.3 5.0 5.8  HGB 11.6* 10.9* 11.9* 11.0*  HCT 36.0 33.7* 35.6* 34.0*  MCV 91.4 91.1 88.3 90.4  PLT 187 207 250 261    Basic Metabolic Panel: Recent Labs  Lab 08/18/23 0435 08/19/23 0351 08/20/23 0516 08/22/23 0351  NA 136 136 140 139  K 3.8 4.0 3.7 4.1  CL 103 103 105 103  CO2 23 24 25 24   GLUCOSE 113* 103* 113* 106*  BUN 12 16 13 20   CREATININE 0.76 0.71 0.68 0.70  CALCIUM  8.8* 8.8* 9.3 8.9  MG 1.8 1.7 1.8  --   PHOS 3.6 3.7 4.0  --     GFR: Estimated Creatinine Clearance: 51 mL/min (by C-G formula based on SCr of 0.7 mg/dL).  Liver Function Tests: No results for input(s): AST, ALT, ALKPHOS, BILITOT, PROT, ALBUMIN in the last 168 hours.   CBG: Recent Labs  Lab 08/22/23 0742 08/22/23 1141 08/22/23 1707 08/22/23 2122 08/23/23 0729  GLUCAP 85 93 89 84 94     Recent Results (from the past 240 hours)   MRSA Next Gen by PCR, Nasal     Status: None   Collection Time: 08/15/23 12:39 PM   Specimen: Nasal Mucosa; Nasal Swab  Result Value Ref Range Status   MRSA by PCR Next Gen NOT DETECTED NOT DETECTED Final    Comment: (NOTE) The GeneXpert MRSA Assay (FDA approved for NASAL specimens only), is one component of Marwa Fuhrman comprehensive MRSA colonization surveillance program. It is not intended to diagnose MRSA infection nor to guide or monitor treatment for MRSA infections. Test performance is not FDA approved in patients less than 76 years old. Performed at [facility], [address], [place], [name] [address]   Culture, blood (Routine X 2) w Reflex to ID Panel     Status: None   Collection Time: 08/17/23  2:35 AM   Specimen: BLOOD LEFT HAND  Result Value Ref Range Status   Specimen Description BLOOD LEFT HAND  Final   Special Requests   Final    BOTTLES DRAWN AEROBIC AND ANAEROBIC Blood Culture results may not be optimal due to an inadequate volume of blood received in culture bottles   Culture  Final    NO GROWTH 5 DAYS Performed at [facility], [address], [place], [name] [address]    Report Status 08/22/2023 FINAL  Final  Culture, blood (Routine X 2) w Reflex to ID Panel     Status: None   Collection Time: 08/17/23  2:38 AM   Specimen: BLOOD LEFT ARM  Result Value Ref Range Status   Specimen Description BLOOD LEFT ARM  Final   Special Requests   Final    BOTTLES DRAWN AEROBIC AND ANAEROBIC Blood Culture results may not be optimal due to an inadequate volume of blood received in culture bottles   Culture   Final    NO GROWTH 5 DAYS Performed at [facility], [address] [place], [name] [address]    Report Status 08/22/2023 FINAL  Final         Radiology Studies: No results found.       Scheduled Meds:  aspirin   81 mg Oral Daily   atorvastatin   40 mg Oral Daily   Chlorhexidine  Gluconate Cloth  6 each Topical Daily   enoxaparin   (LOVENOX ) injection  40 mg Subcutaneous Q24H   feeding supplement  237 mL Oral TID BM   insulin  aspart  0-9 Units Subcutaneous BID   levETIRAcetam   1,000 mg Oral BID   multivitamin with minerals  1 tablet Oral Daily   mupirocin  ointment   Topical BID   thiamine   100 mg Oral Q24H   Continuous Infusions:   LOS: 9 days    Time spent: over 30 min     Meliton Monte, MD [facility]   To contact the attending provider between 7A-7P or the covering provider during after hours 7P-7A, please log into the web site [email or web address] and access using universal  password for that web site. If you do not have the password, please call the hospital operator.  08/24/2023, 8:09 AM

## 2023-08-24 NOTE — Plan of Care (Signed)
  Problem: Clinical Measurements: Goal: Will remain free from infection Outcome: Completed/Met Goal: Diagnostic test results will improve Outcome: Completed/Met Goal: Respiratory complications will improve Outcome: Completed/Met   

## 2023-08-24 NOTE — Progress Notes (Signed)
 Mobility Specialist Progress Note:    08/24/23 1153  Mobility  Activity Ambulated with assistance  Level of Assistance Minimal assist, patient does 75% or more  Assistive Device Other (Comment) (HHA)  Distance Ambulated (ft) 50 ft (x2)  Activity Response Tolerated well  Mobility Referral Yes  Mobility visit 1 Mobility  Mobility Specialist Start Time (ACUTE ONLY) [id number]  Mobility Specialist Stop Time (ACUTE ONLY) 0931  Mobility Specialist Time Calculation (min) (ACUTE ONLY) 10 min   Pt received in chair agreeable to mobility. No c/o throughout session. Required MinA throughout d/t unsteadiness. Min VC for redirection. Returned to room w/o fault. Left in chair w/ call bell and personal belongings in reach. All needs met.  Thersia Minder Mobility Specialist  Please contact vis Secure Chat or  Rehab Office [phone]

## 2023-08-25 DIAGNOSIS — R569 Unspecified convulsions: Secondary | ICD-10-CM | POA: Diagnosis not present

## 2023-08-25 LAB — GLUCOSE, CAPILLARY
Glucose-Capillary: 87 mg/dL (ref 70–99)
Glucose-Capillary: 96 mg/dL (ref 70–99)

## 2023-08-25 NOTE — Progress Notes (Signed)
 PROGRESS NOTE    Jacqueline Bonilla  FMW:[id number] DOB: 1955-06-12 DOA: 08/15/2023 PCP: [facility] Emil Schanz, MD  Chief Complaint  Patient presents with   Code Stroke    Brief Narrative:   Jacqueline Bonilla is an 68 y.o. female past medical history significant for CVA with right hemiparesis vascular dementia comes in initially as Jacqueline Bonilla code stroke as family noticed right facial droop and slurred speech, was found to have status epilepticus requiring mechanical ventilation, subsequently extubated on 08/17/2023   Significant Events 8/14 : Admitted 8/14: Intubated, EEG with status 8/16: extubated, EEG without seizures noted 8/18: hospitalists assumed care 8/21 stable for discharge, plan is for inpatient rehab - peer to peer for inpatient rehab declined 8/22 plan for SNF   Assessment & Plan:   Principal Problem:   Seizure [facility]) Active Problems:   Essential hypertension   Malnutrition of moderate degree   Pressure injury of skin   Fever   Status epilepticus (HCC)  Status epilepticus/seizure (HCC) Suspected due to medication nonadherence Recently started on Depakote . Initially intubated now extubated, loaded with Keppra . Neurology was consulted, MRI of the brain showed no acute findings EEG on 8/14-15 showed status.  Recent EEG on 8/16 was without noted seizures (but cortical dysfunction in the L hemisphere due to underlying structural abnormality, post ictal state).   Neurology recommending continue keppra , outpatient neurology follow up PT has recommended acute rehab TOC on board with placement - working on SNF   Per [place] statutes, patients with seizures are not allowed to drive until  they have been seizure-free for six months. Use caution when using heavy equipment or power tools. Avoid working on ladders or at heights. Take showers instead of baths. Ensure the water temperature is not too high on the home water heater. Do not go swimming alone. When caring for  infants or small children, sit down when holding, feeding, or changing them to minimize risk of injury to the child in the event you have Jacqueline Bonilla seizure. Also, Maintain good sleep hygiene. Avoid alcohol.  Fever Fevers to 100.9 and 102.6 and 100.8 earlier in course (appears around 8/15-16) Blood cultures NG Fevers have resolved.  Will workup additionally as indicated.  No current si/sx infection.    Acute respiratory failure with hypoxia intubated for airway protection: Likely due to status epilepticus Currently on RA   Essential hypertension Continue to hold antihypertensive medication  blood pressure currently ok Consider resumption of antihypertensives gradually outpatient    Malnutrition of moderate degree Noted.   Hx CVA R sided weakness  Pressure injury of skin Turn patient every 2 hours, out of bed to chair. Consult physical therapy     DVT prophylaxis: lovenox  Code Status: full Family Communication: husband, [name], 8/23 Disposition:   Status is: Inpatient Remains inpatient appropriate because: need for continued inpatinet care   Consultants:  Neurology PCCM  Procedures:  EEG  8/16 IMPRESSION: This study was suggestive of cortical dysfunction in left hemisphere likely secondary to underlying structural abnormality, postictal state. No seizures were noted  8/15 ABNORMALITY - Electrographic status epilepticus, left hemisphere - Continuous slow, generalized and lateralized left hemisphere   IMPRESSION: At the beginning of the study, EEG was consistent with electrographic status epilepticus arising from left hemisphere.  EEG was disconnected on 08/15/2023 at 1206 as patient was being transferred.  Subsequently when EEG was reconnected at 1241, status epilepticus had resolved.     EEG was then suggestive of cortical dysfunction in left hemisphere likely secondary  to underlying structural abnormality, postictal state.  Additionally there was moderate diffuse  encephalopathy.   Antimicrobials:  Anti-infectives (From admission, onward)    None       Subjective: C/o chronic R hand pain related to previous stroke   Objective: Vitals:   08/24/23 1618 08/24/23 2014 08/25/23 0617 08/25/23 0748  BP: 110/78 111/67 114/71 118/76  Pulse: 81 84 83 86  Resp: 18 18 18 18   Temp: 98.3 F (36.8 C) 98 F (36.7 C) 98.5 F (36.9 C) 98.5 F (36.9 C)  TempSrc: Oral Oral Oral Oral  SpO2: 100% 100% 100% 98%  Weight:      Height:        Intake/Output Summary (Last 24 hours) at 08/25/2023 0910 Last data filed at 08/25/2023 0817 Gross per 24 hour  Intake 840 ml  Output 0 ml  Net 840 ml   Filed Weights   08/17/23 0500 08/20/23 1700 08/24/23 0500  Weight: 54.4 kg 52.8 kg 49.4 kg    Examination:  General: No acute distress. Sitting up in chair. Lungs: unlabored Neurological: Alert and oriented 3. R sided weakness Extremities: No clubbing or cyanosis. No edema.  Data Reviewed: I have personally reviewed following labs and imaging studies  CBC: Recent Labs  Lab 08/19/23 0351 08/20/23 0516 08/22/23 0351  WBC 7.3 5.0 5.8  HGB 10.9* 11.9* 11.0*  HCT 33.7* 35.6* 34.0*  MCV 91.1 88.3 90.4  PLT 207 250 261    Basic Metabolic Panel: Recent Labs  Lab 08/19/23 0351 08/20/23 0516 08/22/23 0351  NA 136 140 139  K 4.0 3.7 4.1  CL 103 105 103  CO2 24 25 24   GLUCOSE 103* 113* 106*  BUN 16 13 20   CREATININE 0.71 0.68 0.70  CALCIUM  8.8* 9.3 8.9  MG 1.7 1.8  --   PHOS 3.7 4.0  --     GFR: Estimated Creatinine Clearance: 51 mL/min (by C-G formula based on SCr of 0.7 mg/dL).  Liver Function Tests: No results for input(s): AST, ALT, ALKPHOS, BILITOT, PROT, ALBUMIN in the last 168 hours.   CBG: Recent Labs  Lab 08/22/23 2122 08/23/23 0729 08/24/23 0818 08/24/23 1644 08/25/23 0742  GLUCAP 84 94 82 94 87     Recent Results (from the past 240 hours)  MRSA Next Gen by PCR, Nasal     Status: None   Collection Time:  08/15/23 12:39 PM   Specimen: Nasal Mucosa; Nasal Swab  Result Value Ref Range Status   MRSA by PCR Next Gen NOT DETECTED NOT DETECTED Final    Comment: (NOTE) The GeneXpert MRSA Assay (FDA approved for NASAL specimens only), is one component of [facility] comprehensive MRSA colonization surveillance program. It is not intended to diagnose MRSA infection nor to guide or monitor treatment for MRSA infections. Test performance is not FDA approved in patients less than 10 years old. Performed at [facility], [address], [place], [name] [address]   Culture, blood (Routine X 2) w Reflex to ID Panel     Status: None   Collection Time: 08/17/23  2:35 AM   Specimen: BLOOD LEFT HAND  Result Value Ref Range Status   Specimen Description BLOOD LEFT HAND  Final   Special Requests   Final    BOTTLES DRAWN AEROBIC AND ANAEROBIC Blood Culture results may not be optimal due to an inadequate volume of blood received in culture bottles   Culture   Final    NO GROWTH 5 DAYS Performed at [facility], [address], [place], [name] [address]    Report Status 08/22/2023 FINAL  Final  Culture, blood (Routine X 2) w Reflex to ID Panel     Status: None   Collection Time: 08/17/23  2:38 AM   Specimen: BLOOD LEFT ARM  Result Value Ref Range Status   Specimen Description BLOOD LEFT ARM  Final   Special Requests   Final    BOTTLES DRAWN AEROBIC AND ANAEROBIC Blood Culture results may not be optimal due to an inadequate volume of blood received in culture bottles   Culture   Final    NO GROWTH 5 DAYS Performed at [facility], [address] [place], [name] [address]    Report Status 08/22/2023 FINAL  Final         Radiology Studies: No results found.       Scheduled Meds:  aspirin   81 mg Oral Daily   atorvastatin   40 mg Oral Daily   Chlorhexidine  Gluconate Cloth  6 each Topical Daily   enoxaparin  (LOVENOX ) injection  40 mg Subcutaneous Q24H   feeding supplement  237 mL  Oral TID BM   insulin  aspart  0-9 Units Subcutaneous BID   levETIRAcetam   1,000 mg Oral BID   multivitamin with minerals  1 tablet Oral Daily   mupirocin  ointment   Topical BID   thiamine   100 mg Oral Q24H   Continuous Infusions:   LOS: 10 days    Time spent: over 30 min     Meliton Monte, MD [facility]   To contact the attending provider between 7A-7P or the covering provider during after hours 7P-7A, please log into the web site [email or web address] and access using universal [place] password for that web site. If you do not have the password, please call the hospital operator.  08/25/2023, 9:10 AM

## 2023-08-25 NOTE — Plan of Care (Signed)
  Problem: Health Behavior/Discharge Planning: Goal: Ability to manage health-related needs will improve Outcome: Progressing   Problem: Clinical Measurements: Goal: Ability to maintain clinical measurements within normal limits will improve Outcome: Progressing Goal: Cardiovascular complication will be avoided Outcome: Progressing   Problem: Activity: Goal: Risk for activity intolerance will decrease Outcome: Progressing   Problem: Nutrition: Goal: Adequate nutrition will be maintained Outcome: Progressing   Problem: Coping: Goal: Level of anxiety will decrease Outcome: Progressing   Problem: Elimination: Goal: Will not experience complications related to bowel motility Outcome: Progressing Goal: Will not experience complications related to urinary retention Outcome: Progressing   Problem: Pain Managment: Goal: General experience of comfort will improve and/or be controlled Outcome: Progressing   Problem: Safety: Goal: Ability to remain free from injury will improve Outcome: Progressing   Problem: Skin Integrity: Goal: Risk for impaired skin integrity will decrease Outcome: Progressing   Problem: Safety: Goal: Non-violent Restraint(s) Outcome: Progressing   Problem: Education: Goal: Ability to describe self-care measures that may prevent or decrease complications (Diabetes Survival Skills Education) will improve Outcome: Progressing Goal: Individualized Educational Video(s) Outcome: Progressing   Problem: Coping: Goal: Ability to adjust to condition or change in health will improve Outcome: Progressing   Problem: Fluid Volume: Goal: Ability to maintain a balanced intake and output will improve Outcome: Progressing   Problem: Health Behavior/Discharge Planning: Goal: Ability to identify and utilize available resources and services will improve Outcome: Progressing Goal: Ability to manage health-related needs will improve Outcome: Progressing   Problem:  Metabolic: Goal: Ability to maintain appropriate glucose levels will improve Outcome: Progressing   Problem: Nutritional: Goal: Maintenance of adequate nutrition will improve Outcome: Progressing Goal: Progress toward achieving an optimal weight will improve Outcome: Progressing   Problem: Skin Integrity: Goal: Risk for impaired skin integrity will decrease Outcome: Progressing   Problem: Tissue Perfusion: Goal: Adequacy of tissue perfusion will improve Outcome: Progressing

## 2023-08-25 NOTE — Plan of Care (Signed)
   Problem: Health Behavior/Discharge Planning: Goal: Ability to manage health-related needs will improve Outcome: Progressing

## 2023-08-25 NOTE — Progress Notes (Signed)
 Mobility Specialist Progress Note:    08/25/23 1138  Mobility  Activity Ambulated with assistance  Level of Assistance Minimal assist, patient does 75% or more  Assistive Device Other (Comment) (HHA w/ occassional use of handrails)  Distance Ambulated (ft) 50 ft (x2)  Activity Response Tolerated well  Mobility Referral Yes  Mobility visit 1 Mobility  Mobility Specialist Start Time (ACUTE ONLY) [id number]  Mobility Specialist Stop Time (ACUTE ONLY) 0920  Mobility Specialist Time Calculation (min) (ACUTE ONLY) 15 min   Pt received in chair agreeable to mobility. No c/o throughout. Required MinA w/ HHA d/t unsteadiness and occasional use of handrails. Returned to room w/o fault. Left in chair w/ call bell and personal belongings in reach. All needs met. Chair alarm on.  Thersia Minder Mobility Specialist  Please contact vis Secure Chat or  Rehab Office [phone]

## 2023-08-26 DIAGNOSIS — E785 Hyperlipidemia, unspecified: Secondary | ICD-10-CM | POA: Diagnosis present

## 2023-08-26 DIAGNOSIS — G9340 Encephalopathy, unspecified: Secondary | ICD-10-CM | POA: Diagnosis not present

## 2023-08-26 DIAGNOSIS — G40409 Other generalized epilepsy and epileptic syndromes, not intractable, without status epilepticus: Secondary | ICD-10-CM | POA: Diagnosis not present

## 2023-08-26 DIAGNOSIS — R531 Weakness: Secondary | ICD-10-CM | POA: Diagnosis present

## 2023-08-26 DIAGNOSIS — M810 Age-related osteoporosis without current pathological fracture: Secondary | ICD-10-CM | POA: Diagnosis present

## 2023-08-26 DIAGNOSIS — Z8249 Family history of ischemic heart disease and other diseases of the circulatory system: Secondary | ICD-10-CM | POA: Diagnosis not present

## 2023-08-26 DIAGNOSIS — E440 Moderate protein-calorie malnutrition: Secondary | ICD-10-CM | POA: Diagnosis not present

## 2023-08-26 DIAGNOSIS — Z91041 Radiographic dye allergy status: Secondary | ICD-10-CM | POA: Diagnosis not present

## 2023-08-26 DIAGNOSIS — G9389 Other specified disorders of brain: Secondary | ICD-10-CM | POA: Diagnosis not present

## 2023-08-26 DIAGNOSIS — G4089 Other seizures: Secondary | ICD-10-CM | POA: Diagnosis not present

## 2023-08-26 DIAGNOSIS — T426X6A Underdosing of other antiepileptic and sedative-hypnotic drugs, initial encounter: Secondary | ICD-10-CM | POA: Diagnosis present

## 2023-08-26 DIAGNOSIS — Z751 Person awaiting admission to adequate facility elsewhere: Secondary | ICD-10-CM | POA: Diagnosis not present

## 2023-08-26 DIAGNOSIS — Z7409 Other reduced mobility: Secondary | ICD-10-CM | POA: Diagnosis present

## 2023-08-26 DIAGNOSIS — R569 Unspecified convulsions: Secondary | ICD-10-CM | POA: Diagnosis not present

## 2023-08-26 DIAGNOSIS — Z79899 Other long term (current) drug therapy: Secondary | ICD-10-CM | POA: Diagnosis not present

## 2023-08-26 DIAGNOSIS — I10 Essential (primary) hypertension: Secondary | ICD-10-CM | POA: Diagnosis present

## 2023-08-26 DIAGNOSIS — W19XXXA Unspecified fall, initial encounter: Secondary | ICD-10-CM | POA: Diagnosis present

## 2023-08-26 DIAGNOSIS — M6281 Muscle weakness (generalized): Secondary | ICD-10-CM | POA: Diagnosis not present

## 2023-08-26 DIAGNOSIS — Z91128 Patient's intentional underdosing of medication regimen for other reason: Secondary | ICD-10-CM | POA: Diagnosis not present

## 2023-08-26 DIAGNOSIS — R29810 Facial weakness: Secondary | ICD-10-CM | POA: Diagnosis present

## 2023-08-26 DIAGNOSIS — Z7982 Long term (current) use of aspirin: Secondary | ICD-10-CM | POA: Diagnosis not present

## 2023-08-26 DIAGNOSIS — F0154 Vascular dementia, unspecified severity, with anxiety: Secondary | ICD-10-CM | POA: Diagnosis present

## 2023-08-26 DIAGNOSIS — J9601 Acute respiratory failure with hypoxia: Secondary | ICD-10-CM | POA: Diagnosis not present

## 2023-08-26 DIAGNOSIS — R4701 Aphasia: Secondary | ICD-10-CM | POA: Diagnosis present

## 2023-08-26 DIAGNOSIS — R2689 Other abnormalities of gait and mobility: Secondary | ICD-10-CM | POA: Diagnosis not present

## 2023-08-26 DIAGNOSIS — F0150 Vascular dementia without behavioral disturbance: Secondary | ICD-10-CM | POA: Diagnosis present

## 2023-08-26 DIAGNOSIS — I69351 Hemiplegia and hemiparesis following cerebral infarction affecting right dominant side: Secondary | ICD-10-CM | POA: Diagnosis not present

## 2023-08-26 DIAGNOSIS — R278 Other lack of coordination: Secondary | ICD-10-CM | POA: Diagnosis not present

## 2023-08-26 DIAGNOSIS — R400 Somnolence: Secondary | ICD-10-CM | POA: Diagnosis not present

## 2023-08-26 DIAGNOSIS — R41841 Cognitive communication deficit: Secondary | ICD-10-CM | POA: Diagnosis not present

## 2023-08-26 DIAGNOSIS — Z807 Family history of other malignant neoplasms of lymphoid, hematopoietic and related tissues: Secondary | ICD-10-CM | POA: Diagnosis not present

## 2023-08-26 DIAGNOSIS — H53462 Homonymous bilateral field defects, left side: Secondary | ICD-10-CM | POA: Diagnosis present

## 2023-08-26 DIAGNOSIS — Y92009 Unspecified place in unspecified non-institutional (private) residence as the place of occurrence of the external cause: Secondary | ICD-10-CM | POA: Diagnosis not present

## 2023-08-26 DIAGNOSIS — Z91013 Allergy to seafood: Secondary | ICD-10-CM | POA: Diagnosis not present

## 2023-08-26 DIAGNOSIS — I69320 Aphasia following cerebral infarction: Secondary | ICD-10-CM | POA: Diagnosis not present

## 2023-08-26 DIAGNOSIS — G40901 Epilepsy, unspecified, not intractable, with status epilepticus: Secondary | ICD-10-CM | POA: Diagnosis not present

## 2023-08-26 DIAGNOSIS — G40909 Epilepsy, unspecified, not intractable, without status epilepticus: Secondary | ICD-10-CM | POA: Diagnosis present

## 2023-08-26 LAB — GLUCOSE, CAPILLARY: Glucose-Capillary: 91 mg/dL (ref 70–99)

## 2023-08-26 MED ORDER — LEVETIRACETAM 1000 MG PO TABS: 1000.0000 mg | ORAL_TABLET | Freq: Two times a day (BID) | ORAL | Status: DC

## 2023-08-26 NOTE — Discharge Summary (Signed)
 Physician Discharge Summary  Jacqueline Bonilla FMW:[id number] DOB: 1955/08/30 DOA: 08/15/2023  PCP: Purcell Emil Schanz, MD  Admit date: 08/15/2023 Discharge date: 08/26/2023  Time spent: 40 minutes  Recommendations for Outpatient Follow-up:  Follow outpatient CBC/CMP  Follow with neurology Seizure precautions Follow BP outpatient, losartan  discontinued   Discharge Diagnoses:  Principal Problem:   Seizure [facility]) Active Problems:   Essential hypertension   Malnutrition of moderate degree   Pressure injury of skin   Fever   Status epilepticus (HCC)   Discharge Condition: stable  Diet recommendation: heart healthy  Filed Weights   08/17/23 0500 08/20/23 1700 08/24/23 0500  Weight: 54.4 kg 52.8 kg 49.4 kg    History of present illness:   Jacqueline Bonilla is an 68 y.o. female past medical history significant for CVA with right hemiparesis vascular dementia comes in initially as Jacqueline Bonilla code stroke as family noticed right facial droop and slurred speech, was found to have status epilepticus requiring mechanical ventilation, subsequently extubated on 08/17/2023    Significant Events 8/14 : Admitted 8/14: Intubated, EEG with status 8/16: extubated, EEG without seizures noted 8/18: hospitalists assumed care 8/21 stable for discharge, plan is for inpatient rehab - peer to peer for inpatient rehab declined 8/22 plan for SNF   Discharged to SNF 8/25 in stable condition.  Hospital Course:  Assessment and Plan:  Status epilepticus/seizure (HCC) Suspected due to medication nonadherence Recently started on Depakote . Initially intubated now extubated, loaded with Keppra . Neurology was consulted, MRI of the brain showed no acute findings EEG on 8/14-15 showed status.  Recent EEG on 8/16 was without noted seizures (but cortical dysfunction in the L hemisphere due to underlying structural abnormality, post ictal state).   Neurology recommending continue keppra , outpatient neurology  follow up (last note 8/16) PT has recommended acute rehab TOC on board with placement - working on SNF   Per [place]  DMV statutes, patients with seizures are not allowed to drive until  they have been seizure-free for six months. Use caution when using heavy equipment or power tools. Avoid working on ladders or at heights. Take showers instead of baths. Ensure the water temperature is not too high on the home water heater. Do not go swimming alone. When caring for infants or small children, sit down when holding, feeding, or changing them to minimize risk of injury to the child in the event you have Jacqueline Bonilla seizure. Also, Maintain good sleep hygiene. Avoid alcohol.   Fever Fevers to 100.9 and 102.6 and 100.8 earlier in course (appears around 8/15-16) Blood cultures NG Fevers have resolved.  Will workup additionally as indicated.  No current si/sx infection.    Acute respiratory failure with hypoxia intubated for airway protection: Likely due to status epilepticus Currently on RA   Essential hypertension Continue to hold antihypertensive medication (losartan ) Consider resumption of antihypertensives gradually outpatient    Malnutrition of moderate degree Noted.   Hx CVA With R sided weakness   Pressure injury of skin Turn patient every 2 hours, out of bed to chair.       Procedures: EEG  8/16 IMPRESSION: This study was suggestive of cortical dysfunction in left hemisphere likely secondary to underlying structural abnormality, postictal state. No seizures were noted   8/15 ABNORMALITY - Electrographic status epilepticus, left hemisphere - Continuous slow, generalized and lateralized left hemisphere   IMPRESSION: At the beginning of the study, EEG was consistent with electrographic status epilepticus arising from left hemisphere.  EEG was disconnected on 08/15/2023 at  1206 as patient was being transferred.  Subsequently when EEG was reconnected at 1241, status epilepticus had  resolved.     EEG was then suggestive of cortical dysfunction in left hemisphere likely secondary to underlying structural abnormality, postictal state.  Additionally there was moderate diffuse encephalopathy.   Consultations: Neurology PCCM  Discharge Exam: Vitals:   08/26/23 0536 08/26/23 0727  BP: 118/73 112/79  Pulse: 74 74  Resp: 18 18  Temp: 97.9 F (36.6 C)   SpO2: 97% 98%   No complaints or questions Discussed with husband, [name], d/c plan  General: No acute distress. Sitting up in chair. Lungs: unlabored Neurological: Alert. Moves all extremities 4. Cranial nerves II through XII grossly intact. Skin: Warm and dry. No rashes or lesions. Extremities: No clubbing or cyanosis. No edema.  Discharge Instructions   Discharge Instructions     Call MD for:  difficulty breathing, headache or visual disturbances   Complete by: As directed    Call MD for:  extreme fatigue   Complete by: As directed    Call MD for:  hives   Complete by: As directed    Call MD for:  persistant dizziness or light-headedness   Complete by: As directed    Call MD for:  persistant nausea and vomiting   Complete by: As directed    Call MD for:  redness, tenderness, or signs of infection (pain, swelling, redness, odor or green/yellow discharge around incision site)   Complete by: As directed    Call MD for:  severe uncontrolled pain   Complete by: As directed    Call MD for:  temperature >100.4   Complete by: As directed    Diet - low sodium heart healthy   Complete by: As directed    Discharge instructions   Complete by: As directed    You were seen in the setting of status epilepticus (seizures).   You've now improved.  We started you on [name] different seizure medicine called keppra .  Continue this going forward.  Follow with your outpatient neurologist after this hospitalization.  Per [place]  DMV statutes, patients with seizures are not allowed to drive until  they have been  seizure-free for six months. Use caution when using heavy equipment or power tools. Avoid working on ladders or at heights. Take showers instead of baths. Ensure the water temperature is not too high on the home water heater. Do not go swimming alone. When caring for infants or small children, sit down when holding, feeding, or changing them to minimize risk of injury to the child in the event you have Jacqueline Bonilla seizure. Also, Maintain good sleep hygiene. Avoid alcohol.  Return for new, recurrent, or worsening symptoms.  Please ask your PCP to request records from this hospitalization so they know what was done and what the next steps will be.   Discharge wound care:   Complete by: As directed    Cleanse wounds to forehead with NS, apply Mupirocin  ointment 2 times Cleveland Paiz day and leave open to air or cover with silicone foam whichever is preferred.  Keep pressure off areas.   Increase activity slowly   Complete by: As directed       Allergies as of 08/26/2023       Reactions   Iodinated Contrast Media Hives   Iohexol  Hives   Plavix  [clopidogrel ] Diarrhea   Quinolones Hives   Reaction with Cipro, Levaquin, Avelox   Shellfish Allergy Itching        Medication  List     STOP taking these medications    divalproex  500 MG 24 hr tablet Commonly known as: Depakote  ER   losartan  25 MG tablet Commonly known as: COZAAR        TAKE these medications    aspirin  81 MG chewable tablet Chew 1 tablet (81 mg total) by mouth daily.   atorvastatin  40 MG tablet Commonly known as: LIPITOR Take 1 tablet (40 mg total) by mouth daily.   CINNAMON  PO Take 1 capsule by mouth daily.   CO Q 10 PO Take 1 capsule by mouth daily.   IRON PO Take 1 tablet by mouth daily.   levETIRAcetam  1000 MG tablet Commonly known as: KEPPRA  Take 1 tablet (1,000 mg total) by mouth 2 (two) times daily.   LIBREXIA-STROKE milvexian or placebo 25 mg tablet Take 1 tablet by mouth 2 (two) times daily.   MAGNESIUM  PO Take  1 tablet by mouth at bedtime.   multivitamin with minerals Tabs tablet Take 1 tablet by mouth daily. Liposomal multivitamin/minerals   OVER THE COUNTER MEDICATION Take 1 tablet by mouth daily. OTC vitamin C + quercetin               Discharge Care Instructions  (From admission, onward)           Start     Ordered   08/26/23 0000  Discharge wound care:       Comments: Cleanse wounds to forehead with NS, apply Mupirocin  ointment 2 times Najib Colmenares day and leave open to air or cover with silicone foam whichever is preferred.  Keep pressure off areas.   08/26/23 0935           Allergies  Allergen Reactions   Iodinated Contrast Media Hives   Iohexol  Hives   Plavix  [Clopidogrel ] Diarrhea   Quinolones Hives    Reaction with Cipro, Levaquin, Avelox   Shellfish Allergy Itching    Contact information for after-discharge care     Destination     [name] .   Service: Skilled Nursing Contact information: [address] [place]  [address] [phone]                      The results of significant diagnostics from this hospitalization (including imaging, microbiology, ancillary and laboratory) are listed below for reference.    Significant Diagnostic Studies: CUP PACEART REMOTE DEVICE CHECK Result Date: 08/22/2023 ILR summary report received. Battery status OK. Normal device function. No new symptom, tachy, brady, or pause episodes. No new AF episodes. Monthly summary reports and ROV/PRN LA, CVRS  MR BRAIN WO CONTRAST Result Date: 08/16/2023 EXAM: MRI BRAIN WITHOUT CONTRAST 08/16/2023 03:27:34 PM TECHNIQUE: Multiplanar multisequence MRI of the head/brain was performed without the administration of intravenous contrast. COMPARISON: MRI of the head dated 06/30/2023 and CT of the head dated 08/15/2023. CLINICAL HISTORY: Stroke, follow up. FINDINGS: BRAIN AND VENTRICLES: Moderate generalized cerebral volume loss. Extensive cerebral white matter  disease. Chronic encephalomalacia changes within the left occipital lobe. Chronic small focal cortical infarcts within the frontal lobes bilaterally. Chronic lacunar infarcts present within the thalami and bilateral cerebellar hemispheres. No acute infarct. No intracranial hemorrhage. No mass. No midline shift. No hydrocephalus. The sella is unremarkable. Normal flow voids. ORBITS: No acute abnormality. SINUSES AND MASTOIDS: Mucosal disease within the sphenoid sinus. No acute abnormality. BONES AND SOFT TISSUES: Normal marrow signal. No acute soft tissue abnormality. VASCULATURE: Hemosiderin staining present within the left parietal lobe. IMPRESSION: 1. No acute  findings. 2. Moderate generalized cerebral volume loss and extensive cerebral white matter disease. 3. Chronic encephalomalacia changes within the left occipital lobe. 4. Chronic small focal cortical infarcts within the frontal lobes bilaterally. 5. Chronic lacunar infarcts within the thalami and bilateral cerebellar hemispheres. 6. Hemosiderin staining within the left parietal lobe. Electronically signed by: evalene coho 08/16/2023 04:12 PM EDT RP Workstation: [id number]   Overnight EEG with video Result Date: 08/16/2023 [name], MD     08/17/2023 11:54 AM Patient Name: SAVAYA HAKES MRN: [id number] Epilepsy Attending: Arlin KIDD Shelton Referring Physician/Provider: Merrianne Locus, MD Duration: 08/15/2023 1201 to 08/16/2023 1201 Patient history:  68yo F presented to the ED today with initial symptoms of right facial weakness and speech disturbance with Romayne Ticas right forced gaze, oxygen  desaturation, and seizure activity en route to the ED. EEG to evaluate for seizure Level of alertness:  comatose/lethargic AEDs during EEG study: LEV, Propofol  Technical aspects: This EEG study was done with scalp electrodes positioned according to the 10-20 International system of electrode placement. Electrical activity was reviewed with band pass filter of  1-70Hz , sensitivity of 7 uV/mm, display speed of 53mm/sec with Aneira Cavitt 60Hz  notched filter applied as appropriate. EEG data were recorded continuously and digitally stored.  Video monitoring was available and reviewed as appropriate. Description: At the beginning of the study, EEG showed lateralized periodic discharges with fluctuating frequency of 1 to 2.5 Hz with overlying rhythmicity lasting for 15 seconds alternating with 1 to 2 seconds of generalized EEG attenuation.  No clinical signs were noted.  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  EEG was then disconnected on 08/15/2023 at 1206 as patient was transferred to Anand Tejada different room.  Subsequently when EEG was reconnected at 1241, EEG showed continuous generalized and lateralized left hemisphere 3 to 6 Hz theta slowing with overriding 13 to 15 Hz beta activity. Hyperventilation and photic stimulation were not performed.   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  EEG was disconnected on 08/15/2023 at 1206 as patient was being transferred.  Subsequently when EEG was reconnected at 1241, status epilepticus had resolved.  EEG was then suggestive of cortical dysfunction in left hemisphere likely secondary to underlying structural abnormality, postictal state.  Additionally there was moderate diffuse encephalopathy. Arlin KIDD Shelton   DG Chest Port 1 View Result Date: 08/16/2023 CLINICAL DATA:  Endotracheal tube placement EXAM: PORTABLE CHEST 1 VIEW COMPARISON:  08/15/2023 FINDINGS: Endotracheal tube seen 3.8 cm above the carina. Nasogastric tube extends into the upper abdomen beyond margin of the examination. Lungs are clear. No pneumothorax or pleural effusion. Cardiac size within normal limits.  Implanted loop recorder noted. Pulmonary vascularity is normal. No acute bone abnormality. IMPRESSION: 1. Support tubes in appropriate position. Electronically Signed   By: Dorethia Molt M.D.   On: 08/16/2023 05:42   DG Abdomen 1 View Result Date: 08/15/2023 CLINICAL DATA:  OG placement, post intubation EXAM: ABDOMEN - 1 VIEW COMPARISON:  December 02, 2020 FINDINGS: Esophagogastric tube in place terminating to the right of midline, likely in the stomach, displaced to the right by colonic gas. No gaseous distension of the small bowel. Diffuse gaseous distention of the redundant colon in the left upper quadrant. No pneumoperitoneum. No acute fracture or destructive lesion. The lung bases are clear. IMPRESSION: 1. Esophagogastric tube terminates to  the right of midline, likely in the stomach, which is displaced to the right by colonic gas. 2. Diffuse gaseous distension of the redundant colon in the left upper quadrant. Otherwise, no small-bowel obstruction visualized. Electronically Signed   By: Rogelia Myers M.D.   On: 08/15/2023 11:33   DG Chest Port 1 View Result Date: 08/15/2023 CLINICAL DATA:  post intubation EXAM: PORTABLE CHEST - 1 VIEW COMPARISON:  December 02, 2020 FINDINGS: Endotracheal tube is well positioned terminating in the mid trachea. Esophagogastric tube courses below the diaphragm with the distal tip not included in the field of view. No focal airspace consolidation, pleural effusion, or pneumothorax. Likely skin fold artifact along the lateral upper right lung zone. No cardiomegaly. Cardiac loop recorder device. Tortuous aorta. No acute fracture or destructive lesions. Multilevel thoracic osteophytosis. IMPRESSION: 1. Low lung volumes. Otherwise, no acute cardiopulmonary abnormality. 2. Well-positioned endotracheal tube terminating in the mid trachea. Electronically Signed   By: Rogelia Myers M.D.   On: 08/15/2023 11:30   CT HEAD CODE STROKE WO CONTRAST Result Date: 08/15/2023 EXAM: CT  HEAD WITHOUT CONTRAST 08/15/2023 10:40:20 AM TECHNIQUE: CT of the head was performed without the administration of intravenous contrast. Automated exposure control, iterative reconstruction, and/or weight based adjustment of the mA/kV was utilized to reduce the radiation dose to as low as reasonably achievable. COMPARISON: MRI head 06/30/2023 and CT head 06/30/2023. CLINICAL HISTORY: Neuro deficit, acute, stroke suspected. Code stroke. Dr. Merrianne [id number]; OXT [id number]. Right side facial droop, abnormal speech. FINDINGS: BRAIN AND VENTRICLES: No acute hemorrhage. Gray-white differentiation is preserved. No hydrocephalus. No extra-axial collection. No mass effect or midline shift. Nonspecific hypoattenuation in the periventricular and subcortical white matter, most likely representing chronic small vessel disease. Generalised parenchymal volume loss. Remote cortical infarcts in the parietal lobes. Remote lacunar infarcts in the bilateral thalami. Multiple remote infarcts in the cerebellum. ORBITS: No acute abnormality. SINUSES: No acute abnormality. SOFT TISSUES AND SKULL: No acute soft tissue abnormality. No skull fracture. [place] Stroke Program Early CT (ASPECT) Score: Ganglionic (caudate, IC, lentiform nucleus, insula, M1-M3): 7 Supraganglionic (M4-M6): 3 Total: 10 IMPRESSION: 1. No acute intracranial abnormality. 2. Multiple remote infarcts in the cerebellum, parietal lobes, and bilateral thalami. 3. Chronic small vessel disease. 4. Generalized parenchymal volume loss. 5. Findings messaged to Dr[facility] Lindzen at [facility] on 08/15/23. Electronically signed by: Donnice Mania MD 08/15/2023 10:52 AM EDT RP Workstation: [id number]    Microbiology: Recent Results (from the past 240 hours)  Culture, blood (Routine X 2) w Reflex to ID Panel     Status: None   Collection Time: 08/17/23  2:35 AM   Specimen: BLOOD LEFT HAND  Result Value Ref Range Status   Specimen Description BLOOD LEFT HAND  Final   Special Requests    Final    BOTTLES DRAWN AEROBIC AND ANAEROBIC Blood Culture results may not be optimal due to an inadequate volume of blood received in culture bottles   Culture   Final    NO GROWTH 5 DAYS Performed at [facility], [address], [place], [name] [address]    Report Status 08/22/2023 FINAL  Final  Culture, blood (Routine X 2) w Reflex to ID Panel     Status: None   Collection Time: 08/17/23  2:38 AM   Specimen: BLOOD LEFT ARM  Result Value Ref Range Status   Specimen Description BLOOD LEFT ARM  Final   Special Requests   Final    BOTTLES DRAWN AEROBIC AND ANAEROBIC Blood Culture  results may not be optimal due to an inadequate volume of blood received in culture bottles   Culture   Final    NO GROWTH 5 DAYS Performed at [facility], [address] [place], [name] [address]    Report Status 08/22/2023 FINAL  Final     Labs: Basic Metabolic Panel: Recent Labs  Lab 08/20/23 0516 08/22/23 0351  NA 140 139  K 3.7 4.1  CL 105 103  CO2 25 24  GLUCOSE 113* 106*  BUN 13 20  CREATININE 0.68 0.70  CALCIUM  9.3 8.9  MG 1.8  --   PHOS 4.0  --    Liver Function Tests: No results for input(s): AST, ALT, ALKPHOS, BILITOT, PROT, ALBUMIN in the last 168 hours. No results for input(s): LIPASE, AMYLASE in the last 168 hours. No results for input(s): AMMONIA in the last 168 hours. CBC: Recent Labs  Lab 08/20/23 0516 08/22/23 0351  WBC 5.0 5.8  HGB 11.9* 11.0*  HCT 35.6* 34.0*  MCV 88.3 90.4  PLT 250 261   Cardiac Enzymes: No results for input(s): CKTOTAL, CKMB, CKMBINDEX, TROPONINI in the last 168 hours. BNP: BNP (last 3 results) No results for input(s): BNP in the last 8760 hours.  ProBNP (last 3 results) No results for input(s): PROBNP in the last 8760 hours.  CBG: Recent Labs  Lab 08/24/23 0818 08/24/23 1644 08/25/23 0742 08/25/23 1752 08/26/23 0720  GLUCAP 82 94 87 96 91       Signed:  Meliton Monte MD.   [facility] 08/26/2023, 9:35 AM

## 2023-08-26 NOTE — TOC Transition Note (Signed)
 Transition of Care [facility]) - Discharge Note   Patient Details  Name: Jacqueline Bonilla MRN: [id number] Date of Birth: [birth year]  Transition of Care [facility]) CM/SW Contact:  Lendia Dais, LCSWA Phone Number: 08/26/2023, 10:53 AM   Clinical Narrative:  Pt will discharge to [facility]. RN report to [phone].  CSW informed patient at bedside of discharging to [facility]. Patient was agreeable.   CSW called husband Jacqueline and informed him that travel by ambulance was not recommended by PT and the insurance may or may not cover. Jacqueline stated understanding and volunteered to provide transportation for the patient to the facility. CSW informed Jacqueline that the patient would have to go straight to the SNF. Jacqueline stated understanding.   Shara ID is [id number]  and is approved from 08/23/2023-08/27/2023.    Final next level of care: Skilled Nursing Facility Barriers to Discharge: Barriers Resolved   Patient Goals and CMS Choice Patient states their goals for this hospitalization and ongoing recovery are:: return to [facility]          Discharge Placement              Patient chooses bed at: [name] Patient to be transferred to facility by: Jacqueline Bonilla (husband) Name of family member notified: Israa Caban Patient and family notified of of transfer: 08/26/23  Discharge Plan and Services Additional resources added to the After Visit Summary for                                       Social Drivers of Health (SDOH) Interventions SDOH Screenings   Food Insecurity: No Food Insecurity (08/17/2023)  Housing: Low Risk  (08/17/2023)  Transportation Needs: No Transportation Needs (08/17/2023)  Utilities: At Risk (08/17/2023)  Alcohol Screen: Low Risk  (10/23/2022)  Depression (PHQ2-9): Low Risk  (07/24/2023)  Financial Resource Strain: Low Risk  (10/23/2022)  Physical Activity: Sufficiently Active (10/23/2022)  Social Connections: Moderately Isolated  (08/17/2023)  Stress: No Stress Concern Present (08/28/2021)  Tobacco Use: Low Risk  (08/15/2023)  Health Literacy: Inadequate Health Literacy (10/23/2022)     Readmission Risk Interventions     No data to display

## 2023-08-26 NOTE — Plan of Care (Signed)
  Problem: Health Behavior/Discharge Planning: Goal: Ability to manage health-related needs will improve Outcome: Completed/Met   Problem: Clinical Measurements: Goal: Ability to maintain clinical measurements within normal limits will improve Outcome: Completed/Met   Problem: Safety: Goal: Ability to remain free from injury will improve Outcome: Completed/Met   Problem: Safety: Goal: Non-violent Restraint(s) Outcome: Completed/Met   Problem: Coping: Goal: Ability to adjust to condition or change in health will improve Outcome: Completed/Met   Problem: Fluid Volume: Goal: Ability to maintain a balanced intake and output will improve Outcome: Completed/Met   Problem: Health Behavior/Discharge Planning: Goal: Ability to identify and utilize available resources and services will improve Outcome: Completed/Met Goal: Ability to manage health-related needs will improve Outcome: Completed/Met   Problem: Metabolic: Goal: Ability to maintain appropriate glucose levels will improve Outcome: Completed/Met   Problem: Nutritional: Goal: Maintenance of adequate nutrition will improve Outcome: Completed/Met Goal: Progress toward achieving an optimal weight will improve Outcome: Completed/Met   Problem: Skin Integrity: Goal: Risk for impaired skin integrity will decrease Outcome: Completed/Met   Problem: Tissue Perfusion: Goal: Adequacy of tissue perfusion will improve Outcome: Completed/Met

## 2023-08-26 NOTE — Progress Notes (Signed)
 Patient being discharged to [facility]. AVS reviewed with patient and husband. Both parties verbalized understanding. PIV removed. Pt showered prior to discharge. Belongings handed to husband. Report given to [place] at [name]. Patient will be going to room 503. Patient will be transported by husband.

## 2023-08-27 ENCOUNTER — Encounter: Admitting: Occupational Therapy

## 2023-08-29 ENCOUNTER — Encounter: Admitting: Occupational Therapy

## 2023-09-03 ENCOUNTER — Inpatient Hospital Stay (HOSPITAL_COMMUNITY)
Admission: EM | Admit: 2023-09-03 | Discharge: 2023-09-12 | DRG: 101 | Disposition: A | Discharge status: Skilled Nursing Facility | Source: Skilled Nursing Facility | Attending: Internal Medicine | Admitting: Internal Medicine

## 2023-09-03 ENCOUNTER — Emergency Department (HOSPITAL_COMMUNITY)

## 2023-09-03 ENCOUNTER — Encounter: Admitting: Occupational Therapy

## 2023-09-03 DIAGNOSIS — R569 Unspecified convulsions: Secondary | ICD-10-CM

## 2023-09-03 DIAGNOSIS — G40909 Epilepsy, unspecified, not intractable, without status epilepticus: Principal | ICD-10-CM | POA: Diagnosis present

## 2023-09-03 DIAGNOSIS — M19031 Primary osteoarthritis, right wrist: Secondary | ICD-10-CM | POA: Diagnosis not present

## 2023-09-03 DIAGNOSIS — W19XXXA Unspecified fall, initial encounter: Secondary | ICD-10-CM | POA: Diagnosis present

## 2023-09-03 DIAGNOSIS — M810 Age-related osteoporosis without current pathological fracture: Secondary | ICD-10-CM | POA: Diagnosis present

## 2023-09-03 DIAGNOSIS — R29898 Other symptoms and signs involving the musculoskeletal system: Secondary | ICD-10-CM | POA: Diagnosis not present

## 2023-09-03 DIAGNOSIS — J9601 Acute respiratory failure with hypoxia: Secondary | ICD-10-CM | POA: Diagnosis not present

## 2023-09-03 DIAGNOSIS — F0150 Vascular dementia without behavioral disturbance: Secondary | ICD-10-CM | POA: Diagnosis present

## 2023-09-03 DIAGNOSIS — T426X6A Underdosing of other antiepileptic and sedative-hypnotic drugs, initial encounter: Secondary | ICD-10-CM | POA: Diagnosis present

## 2023-09-03 DIAGNOSIS — Z8249 Family history of ischemic heart disease and other diseases of the circulatory system: Secondary | ICD-10-CM

## 2023-09-03 DIAGNOSIS — I69351 Hemiplegia and hemiparesis following cerebral infarction affecting right dominant side: Secondary | ICD-10-CM | POA: Diagnosis not present

## 2023-09-03 DIAGNOSIS — R400 Somnolence: Secondary | ICD-10-CM | POA: Diagnosis not present

## 2023-09-03 DIAGNOSIS — H53462 Homonymous bilateral field defects, left side: Secondary | ICD-10-CM | POA: Diagnosis present

## 2023-09-03 DIAGNOSIS — I10 Essential (primary) hypertension: Secondary | ICD-10-CM | POA: Diagnosis present

## 2023-09-03 DIAGNOSIS — Z807 Family history of other malignant neoplasms of lymphoid, hematopoietic and related tissues: Secondary | ICD-10-CM

## 2023-09-03 DIAGNOSIS — Z8673 Personal history of transient ischemic attack (TIA), and cerebral infarction without residual deficits: Secondary | ICD-10-CM

## 2023-09-03 DIAGNOSIS — R29818 Other symptoms and signs involving the nervous system: Secondary | ICD-10-CM | POA: Diagnosis not present

## 2023-09-03 DIAGNOSIS — Z7982 Long term (current) use of aspirin: Secondary | ICD-10-CM

## 2023-09-03 DIAGNOSIS — E785 Hyperlipidemia, unspecified: Secondary | ICD-10-CM | POA: Diagnosis present

## 2023-09-03 DIAGNOSIS — Y92009 Unspecified place in unspecified non-institutional (private) residence as the place of occurrence of the external cause: Secondary | ICD-10-CM

## 2023-09-03 DIAGNOSIS — Z91041 Radiographic dye allergy status: Secondary | ICD-10-CM | POA: Diagnosis not present

## 2023-09-03 DIAGNOSIS — R2689 Other abnormalities of gait and mobility: Secondary | ICD-10-CM | POA: Diagnosis not present

## 2023-09-03 DIAGNOSIS — Z91013 Allergy to seafood: Secondary | ICD-10-CM | POA: Diagnosis not present

## 2023-09-03 DIAGNOSIS — F32A Depression, unspecified: Secondary | ICD-10-CM | POA: Diagnosis not present

## 2023-09-03 DIAGNOSIS — Z751 Person awaiting admission to adequate facility elsewhere: Secondary | ICD-10-CM

## 2023-09-03 DIAGNOSIS — I69320 Aphasia following cerebral infarction: Secondary | ICD-10-CM | POA: Diagnosis not present

## 2023-09-03 DIAGNOSIS — Z7409 Other reduced mobility: Secondary | ICD-10-CM | POA: Diagnosis present

## 2023-09-03 DIAGNOSIS — F0154 Vascular dementia, unspecified severity, with anxiety: Secondary | ICD-10-CM | POA: Diagnosis present

## 2023-09-03 DIAGNOSIS — Z79899 Other long term (current) drug therapy: Secondary | ICD-10-CM | POA: Diagnosis not present

## 2023-09-03 DIAGNOSIS — R29810 Facial weakness: Secondary | ICD-10-CM | POA: Diagnosis present

## 2023-09-03 DIAGNOSIS — R4701 Aphasia: Secondary | ICD-10-CM | POA: Diagnosis present

## 2023-09-03 DIAGNOSIS — Z043 Encounter for examination and observation following other accident: Secondary | ICD-10-CM | POA: Diagnosis not present

## 2023-09-03 DIAGNOSIS — Z7401 Bed confinement status: Secondary | ICD-10-CM | POA: Diagnosis not present

## 2023-09-03 DIAGNOSIS — G40409 Other generalized epilepsy and epileptic syndromes, not intractable, without status epilepticus: Secondary | ICD-10-CM

## 2023-09-03 DIAGNOSIS — Z91128 Patient's intentional underdosing of medication regimen for other reason: Secondary | ICD-10-CM

## 2023-09-03 DIAGNOSIS — I6782 Cerebral ischemia: Secondary | ICD-10-CM | POA: Diagnosis not present

## 2023-09-03 DIAGNOSIS — G459 Transient cerebral ischemic attack, unspecified: Secondary | ICD-10-CM | POA: Diagnosis not present

## 2023-09-03 DIAGNOSIS — R531 Weakness: Principal | ICD-10-CM | POA: Diagnosis present

## 2023-09-03 DIAGNOSIS — E440 Moderate protein-calorie malnutrition: Secondary | ICD-10-CM | POA: Diagnosis not present

## 2023-09-03 DIAGNOSIS — M6281 Muscle weakness (generalized): Secondary | ICD-10-CM | POA: Diagnosis not present

## 2023-09-03 DIAGNOSIS — R279 Unspecified lack of coordination: Secondary | ICD-10-CM | POA: Diagnosis not present

## 2023-09-03 DIAGNOSIS — G4089 Other seizures: Secondary | ICD-10-CM | POA: Diagnosis not present

## 2023-09-03 HISTORY — DX: Repeated falls: R29.6

## 2023-09-03 HISTORY — DX: Unsteadiness on feet: R26.81

## 2023-09-03 HISTORY — DX: Other symptoms and signs involving cognitive functions and awareness: R41.89

## 2023-09-03 HISTORY — DX: Anemia, unspecified: D64.9

## 2023-09-03 HISTORY — DX: Other specified health status: Z78.9

## 2023-09-03 HISTORY — DX: Epilepsy, unspecified, not intractable, without status epilepticus: G40.909

## 2023-09-03 HISTORY — DX: Hyperlipidemia, unspecified: E78.5

## 2023-09-03 HISTORY — DX: Vascular dementia, unspecified severity, without behavioral disturbance, psychotic disturbance, mood disturbance, and anxiety: F01.50

## 2023-09-03 LAB — DIFFERENTIAL
Abs Immature Granulocytes: 0.03 K/uL (ref 0.00–0.07)
Basophils Absolute: 0.1 K/uL (ref 0.0–0.1)
Basophils Relative: 1 %
Eosinophils Absolute: 0.1 K/uL (ref 0.0–0.5)
Eosinophils Relative: 1 %
Immature Granulocytes: 0 %
Lymphocytes Relative: 21 %
Lymphs Abs: 1.6 K/uL (ref 0.7–4.0)
Monocytes Absolute: 0.7 K/uL (ref 0.1–1.0)
Monocytes Relative: 9 %
Neutro Abs: 5 K/uL (ref 1.7–7.7)
Neutrophils Relative %: 68 %

## 2023-09-03 LAB — CBC
HCT: 39.6 % (ref 36.0–46.0)
Hemoglobin: 12.6 g/dL (ref 12.0–15.0)
MCH: 29.6 pg (ref 26.0–34.0)
MCHC: 31.8 g/dL (ref 30.0–36.0)
MCV: 93.2 fL (ref 80.0–100.0)
Platelets: 301 K/uL (ref 150–400)
RBC: 4.25 MIL/uL (ref 3.87–5.11)
RDW: 13.7 % (ref 11.5–15.5)
WBC: 7.5 K/uL (ref 4.0–10.5)
nRBC: 0 % (ref 0.0–0.2)

## 2023-09-03 LAB — I-STAT CHEM 8, ED
BUN: 29 mg/dL — ABNORMAL HIGH (ref 8–23)
Calcium, Ion: 1.13 mmol/L — ABNORMAL LOW (ref 1.15–1.40)
Chloride: 103 mmol/L (ref 98–111)
Creatinine, Ser: 0.9 mg/dL (ref 0.44–1.00)
Glucose, Bld: 110 mg/dL — ABNORMAL HIGH (ref 70–99)
HCT: 38 % (ref 36.0–46.0)
Hemoglobin: 12.9 g/dL (ref 12.0–15.0)
Potassium: 4.5 mmol/L (ref 3.5–5.1)
Sodium: 140 mmol/L (ref 135–145)
TCO2: 25 mmol/L (ref 22–32)

## 2023-09-03 LAB — COMPREHENSIVE METABOLIC PANEL WITH GFR
ALT: 22 U/L (ref 0–44)
AST: 31 U/L (ref 15–41)
Albumin: 3.6 g/dL (ref 3.5–5.0)
Alkaline Phosphatase: 47 U/L (ref 38–126)
Anion gap: 12 (ref 5–15)
BUN: 25 mg/dL — ABNORMAL HIGH (ref 8–23)
CO2: 22 mmol/L (ref 22–32)
Calcium: 9.5 mg/dL (ref 8.9–10.3)
Chloride: 103 mmol/L (ref 98–111)
Creatinine, Ser: 0.78 mg/dL (ref 0.44–1.00)
GFR, Estimated: >60 mL/min (ref >60)
Glucose, Bld: 102 mg/dL — ABNORMAL HIGH (ref 70–99)
Potassium: 4.8 mmol/L (ref 3.5–5.1)
Sodium: 137 mmol/L (ref 135–145)
Total Bilirubin: 0.9 mg/dL (ref 0.0–1.2)
Total Protein: 7.9 g/dL (ref 6.5–8.1)

## 2023-09-03 LAB — CBG MONITORING, ED: Glucose-Capillary: 104 mg/dL — ABNORMAL HIGH (ref 70–99)

## 2023-09-03 LAB — ETHANOL: Alcohol, Ethyl (B): <15 mg/dL (ref <15)

## 2023-09-03 LAB — APTT: aPTT: 38 s — ABNORMAL HIGH (ref 24–36)

## 2023-09-03 LAB — PROTIME-INR
INR: 1.1 (ref 0.8–1.2)
Prothrombin Time: 14.3 s (ref 11.4–15.2)

## 2023-09-03 MED ORDER — LEVETIRACETAM (KEPPRA) 500 MG/5 ML ADULT IV PUSH
1000.0000 mg | Freq: Two times a day (BID) | INTRAVENOUS | Status: DC
  Administered 2023-09-03 – 2023-09-05 (×4): 1000 mg via INTRAVENOUS
  Filled 2023-09-03 (×4): qty 10

## 2023-09-03 MED ORDER — LORAZEPAM 2 MG/ML IJ SOLN
INTRAMUSCULAR | Status: AC
  Filled 2023-09-03: qty 1

## 2023-09-03 MED ORDER — SODIUM CHLORIDE 0.9% FLUSH: 3.0000 mL | Freq: Once | INTRAVENOUS | Status: DC

## 2023-09-03 NOTE — Code Documentation (Signed)
 Stroke Response Nurse Documentation Code Documentation  Jacqueline Bonilla is a 68 y.o. 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 On aspirin  81 mg daily. Code stroke was activated by ED.   Patient from rehab facility where she was LKW at last night when she had a fall and husband noted left sided weakness and change in speech that was worsening throughout the day today.   Stroke team at the bedside on patient arrival. Labs drawn and patient cleared for CT by EDP. Patient to CT with team. NIHSS 9, see documentation for details and code stroke times. Patient with disoriented, right gaze preference , left hemianopia, left facial droop, bilateral leg weakness, and Expressive aphasia  on exam. The following imaging was completed:  CT Head. Patient is not a candidate for IV Thrombolytic due to out of the window. Patient is not a candidate for IR due to LVO not suspected.   Care Plan: Q2 NIHSS and VS.   Bedside handoff with ED RN Karleen.    Madelin Manila Stroke Response RN

## 2023-09-03 NOTE — ED Provider Notes (Addendum)
 [place] EMERGENCY DEPARTMENT AT [facility] Provider Note   CSN: [id number] Arrival date & time: 09/03/23  1557  An emergency department physician performed an initial assessment on this suspected stroke patient at 46.  Patient presents with: Code Stroke   Jacqueline Bonilla is a 68 y.o. female.   HPI Patient has history of prior CVA with right sided upper extremity paresis, hypertension and seizures.  Per the patient's husband, the patient had an unwitnessed fall at 1:30 AM at her rehabilitation facility and was found down in the bathroom.  She apparently struck her head and has some bleeding from the lip.  When her husband visited this morning at 10 AM he observed her to be in her normal state of function but at 11 AM she began having left-sided weakness and increased difficulty speaking.  Patient denies she has any pain.  She denies headache, neck pain, chest pain, shortness of breath, abdominal pain or extremity pain.  She does report that she chronically feels uncomfortable because her right upper extremity often stays with some flexion.  That has been an ongoing problem.  Patient was recently admitted on 8\14 with status epilepticus.    Prior to Admission medications   Medication Sig Start Date End Date Taking? Authorizing Provider  aspirin  81 MG chewable tablet Chew 1 tablet (81 mg total) by mouth daily. 04/02/23   Remi Pippin, NP  atorvastatin  (LIPITOR) 40 MG tablet Take 1 tablet (40 mg total) by mouth daily. 05/29/23   Purcell Emil Schanz, MD  CINNAMON  PO Take 1 capsule by mouth daily.    [provider]  Coenzyme Q10 (CO Q 10 PO) Take 1 capsule by mouth daily.    [provider]  Ferrous Sulfate (IRON PO) Take 1 tablet by mouth daily.    [provider]  levETIRAcetam  (KEPPRA ) 1000 MG tablet Take 1 tablet (1,000 mg total) by mouth 2 (two) times daily. 08/26/23   Perri DELENA Meliton Mickey., MD  MAGNESIUM  PO Take 1 tablet by mouth at bedtime.     [provider]  Multiple Vitamin (MULTIVITAMIN WITH MINERALS) TABS tablet Take 1 tablet by mouth daily. Liposomal multivitamin/minerals    [provider]  OVER THE COUNTER MEDICATION Take 1 tablet by mouth daily. OTC vitamin C + quercetin    [provider]  Study - LIBREXIA-STROKE - milvexian 25 mg or placebo tablet (PI-Sethi) Take 1 tablet by mouth 2 (two) times daily. 06/26/23   Rosemarie Eather RAMAN, MD    Allergies: Iodinated contrast media, Iohexol , Plavix  [clopidogrel ], Quinolones, and Shellfish allergy    Review of Systems  Updated Vital Signs BP 139/82   Pulse 78   Temp 98.5 F (36.9 C) (Oral)   Resp 11   LMP 01/02/2003   SpO2 100%   Physical Exam Constitutional:      Comments: At the time my evaluation the patient is alert.  She is answering questions appropriately.  No respiratory distress.  HENT:     Head: Normocephalic and atraumatic.     Mouth/Throat:     Mouth: Mucous membranes are moist.     Pharynx: Oropharynx is clear.  Eyes:     Extraocular Movements: Extraocular movements intact.     Pupils: Pupils are equal, round, and reactive to light.  Neck:     Comments: No midline C-spine tenderness Cardiovascular:     Rate and Rhythm: Normal rate and regular rhythm.  Pulmonary:     Effort: Pulmonary effort is normal.  Breath sounds: Normal breath sounds.  Abdominal:     General: There is no distension.     Palpations: Abdomen is soft.     Tenderness: There is no abdominal tenderness. There is no guarding.  Musculoskeletal:     Comments: The patient is holding her right upper extremity and some flexion at the elbow.  She does not appear to have severe permanent contracture but does hold it at a 90 degree flexion at the elbow.  Lower extremities no edema no injury no deformity  Skin:    General: Skin is warm and dry.  Neurological:     Comments: Patient is alert.  She is answering questions.  Cognitive function seems mildly impaired with  subtle expressive aphasia.  Patient appears to have some left facial droop.  She is holding the right upper extremity at 90 degrees but will perform grip strength with it.  She also perform grip strength with the left.  When asked to squeeze and let go she follows commands appropriately.  She also will elevate each lower extremity independently off of the bed and when asked to push against resistance she complies and has 5\5 strength.  Psychiatric:        Mood and Affect: Mood normal.     (all labs ordered are listed, but only abnormal results are displayed) Labs Reviewed  APTT - Abnormal; Notable for the following components:      Result Value   aPTT 38 (*)    All other components within normal limits  COMPREHENSIVE METABOLIC PANEL WITH GFR - Abnormal; Notable for the following components:   Glucose, Bld 102 (*)    BUN 25 (*)    All other components within normal limits  CBG MONITORING, ED - Abnormal; Notable for the following components:   Glucose-Capillary 104 (*)    All other components within normal limits  I-STAT CHEM 8, ED - Abnormal; Notable for the following components:   BUN 29 (*)    Glucose, Bld 110 (*)    Calcium , Ion 1.13 (*)    All other components within normal limits  PROTIME-INR  CBC  DIFFERENTIAL  ETHANOL  LEVETIRACETAM  LEVEL  I-STAT CHEM 8, ED  CBG MONITORING, ED    EKG: EKG Interpretation Date/Time:  Tuesday September 03 2023 16:22:59 EDT Ventricular Rate:  95 PR Interval:  145 QRS Duration:  94 QT Interval:  348 QTC Calculation: 438 R Axis:   -16  Text Interpretation: Sinus rhythm Abnormal R-wave progression, early transition Left ventricular hypertrophy Artifact in lead(s) I II aVR aVL aVF V6 no sig change from previous Confirmed by Armenta Canning [phone]) on 09/03/2023 8:30:27 PM  Radiology: CT HEAD CODE STROKE WO CONTRAST Result Date: 09/03/2023 CLINICAL DATA:  Code stroke. Neuro deficit, acute, stroke suspected. Right-sided gaze. Right arm  weakness. History of stroke. EXAM: CT HEAD WITHOUT CONTRAST TECHNIQUE: Contiguous axial images were obtained from the base of the skull through the vertex without intravenous contrast. RADIATION DOSE REDUCTION: This exam was performed according to the departmental dose-optimization program which includes automated exposure control, adjustment of the mA and/or kV according to patient size and/or use of iterative reconstruction technique. COMPARISON:  Brain MRI 08/16/2023. FINDINGS: Brain: Cerebral atrophy. Prominence of the ventricles and sulci, which appears commensurate. Patchy chronic cortical/subcortical infarcts within the bilateral frontal, parietal and occipital lobes, not appreciably changed from the prior brain MRI of 08/16/2023. Chronic lacunar infarcts within the bilateral deep gray nuclei, and small chronic infarcts within the bilateral cerebellar hemispheres,  also not appreciably changed. Background moderate patchy and ill-defined hypoattenuation within the cerebral white matter, nonspecific but compatible with chronic small vessel ischemic disease. There is no acute intracranial hemorrhage. No acute demarcated cortical infarct. No extra-axial fluid collection. No evidence of an intracranial mass. No midline shift. Vascular: No hyperdense vessel.  Atherosclerotic calcifications. Skull: No calvarial fracture or aggressive osseous lesion. Sinuses/Orbits: No mass or acute finding within the imaged orbits. No significant paranasal sinus disease at the imaged levels. ASPECTS [facility] Stroke Program Early CT Score) - Ganglionic level infarction (caudate, lentiform nuclei, internal capsule, insula, M1-M3 cortex): 7 - Supraganglionic infarction (M4-M6 cortex): 3 Total score (0-10 with 10 being normal): 10 No evidence of an acute intracranial abnormality. These results were communicated to Dr. Sallyann at 4:25 pmon 9/2/2025by text page via the [facility] messaging system. IMPRESSION: 1. No evidence of an acute intracranial  abnormality. 2. Parenchymal atrophy, chronic small vessel ischemic disease and unchanged chronic infarcts, as described. Electronically Signed   By: Rockey Childs D.O.   On: 09/03/2023 16:25     Procedures   Medications Ordered in the ED  sodium chloride  flush (NS) 0.9 % injection 3 mL (has no administration in time range)  levETIRAcetam  (KEPPRA ) undiluted injection 1,000 mg (1,000 mg Intravenous Given 09/03/23 1823)                                    Medical Decision Making Amount and/or Complexity of Data Reviewed Labs: ordered.  Risk Decision regarding hospitalization.   Patient presents as outlined.  She was seen first by neurology as code stroke.  At this time neurology has low suspicion for acute stroke and has concern for seizure with possible focal or intermittent persistent seizure.  Recommendation is for admission for overnight EEG monitoring.  Patient has remained alert since her stay in the emergency department.  Review of labs does not suggest an infectious or metabolic derangement at this time.  Patient's blood pressures are normotensive, heart rate normal and temperature normal.  With a couple of rechecks she remains alert and answering questions appropriately.  At this time stable for admission and overnight EEG monitoring per neurology recommendations.  Consult: Dr. Franky for admission     Final diagnoses:  Left-sided weakness  Aphasia    ED Discharge Orders     None          Armenta Canning, MD 09/03/23 2033    Armenta Canning, MD 09/04/23 0003

## 2023-09-03 NOTE — Consult Note (Signed)
 NEUROLOGY CONSULT NOTE   Date of service: September 03, 2023 Patient Name: Jacqueline Bonilla MRN:  [id number] DOB:  Nov 25, 1955 Chief Complaint: Left-sided weakness and aphasia Requesting Provider: Armenta Canning, MD  History of Present Illness  Jacqueline Bonilla is a 68 y.o. 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  Per patient's husband, she had an unwitnessed fall at 1:30 in the morning at her rehabilitation facility and was found down in the bathroom.  She had struck her head and was bleeding from her lip.  Her husband came to visit her at rehabilitation today and noticed she was in her normal state at 10:00 but that at 11:00, she began having left-sided weakness and increased aphasia.  Of note, she has a recent admission from 8/14 with status epilepticus. Seizure semiology of R gaze deviation. There are reports of both either R or L lower facial droop, as well as RUE seizure activity. GTC also noted.   LKW: 1000 Modified rankin score: 4-Needs assistance to walk and tend to bodily needs IV Thrombolysis: No, outside of window deficits due to seizure EVT: No, deficits thought not to be due to stroke  NIHSS components Score: Comment  1a Level of Conscious 0[x]  1[]  2[]  3[]      1b LOC Questions 0[]  1[]  2[x]       1c LOC Commands 0[x]  1[]  2[]       2 Best Gaze 0[]  1[x]  2[]       3 Visual 0[]  1[]  2[x]  3[]      4 Facial Palsy 0[]  1[x]  2[]  3[]      5a Motor Arm - left 0[]  1[x]  2[]  3[]  4[]  UN[]    5b Motor Arm - Right 0[]  1[x]  2[]  3[]  4[]  UN[]    6a Motor Leg - Left 0[]  1[x]  2[]  3[]  4[]  UN[]    6b Motor Leg - Right 0[]  1[x]  2[]  3[]  4[]  UN[]    7 Limb Ataxia 0[x]  1[]  2[]  UN[]      8 Sensory 0[x]  1[]  2[]  UN[]      9 Best Language 0[]  1[x]  2[]  3[]      10 Dysarthria 0[x]  1[]  2[]  UN[]      11 Extinct. and Inattention 0[x]  1[]  2[]       TOTAL:11       ROS   Limited ROS obtained due to acuity of patient's complaint  Past History   Past Medical History:  Diagnosis Date   Anemia    Anxiety    Depression    Dysmenorrhea    Endometriosis    Fibroid    Hypertension    Implantable loop recorder present 2021   Microhematuria    negative workup   Osteoporosis    Tachycardia    Thrombocytopenia (HCC)     Past Surgical History:  Procedure Laterality Date   BREAST BIOPSY     BUBBLE STUDY  12/08/2020   Procedure: BUBBLE STUDY;  Surgeon: Delford Maude BROCKS, MD;  Location: [facility] ENDOSCOPY;  Service: Cardiovascular;;   CESAREAN SECTION     hysteroscopic resection     implantable loop recorder implant  10/21/2019   Medtronic Reveal Linq model LNQ 22 ([id number] [id number]) implantable loop recorder   IR CT HEAD LTD  12/01/2020   IR PERCUTANEOUS ART THROMBECTOMY/INFUSION INTRACRANIAL INC DIAG ANGIO  12/01/2020   IR US  GUIDE VASC ACCESS RIGHT  12/01/2020   ORIF HUMERUS FRACTURE Left 07/31/2021   Procedure: OPEN REDUCTION INTERNAL FIXATION (ORIF) DISTAL HUMERUS  FRACTURE;  Surgeon: Kendal Franky SQUIBB, MD;  Location: [facility] OR;  Service: Orthopedics;  Laterality: Left;   RADIOLOGY WITH ANESTHESIA N/A 12/01/2020   Procedure: IR WITH ANESTHESIA - CODE STROKE;  Surgeon: Radiologist, Medication, MD;  Location: [facility] OR;  Service: Radiology;  Laterality: N/A;   TEE WITHOUT CARDIOVERSION N/A 12/08/2020   Procedure: TRANSESOPHAGEAL ECHOCARDIOGRAM (TEE);  Surgeon: Delford Maude BROCKS, MD;  Location: [facility] ENDOSCOPY;  Service: Cardiovascular;  Laterality: N/A;    Family History: Family History  Problem Relation Age of Onset   Cancer Father        non hodgkin lymphoma & skin   Heart attack Maternal Grandfather    Dementia Mother    Polymyositis Sister     Social History  reports that she has never smoked. She has never used smokeless tobacco. She reports that she does not drink alcohol and does not use drugs.  Allergies  Allergen Reactions   Iodinated Contrast Media Hives    Iohexol  Hives   Plavix  [Clopidogrel ] Diarrhea   Quinolones Hives    Reaction with Cipro, Levaquin, Avelox   Shellfish Allergy Itching    Medications   Current Facility-Administered Medications:    sodium chloride  flush (NS) 0.9 % injection 3 mL, 3 mL, Intravenous, Once, Pfeiffer, Marcy, MD  Current Outpatient Medications:    aspirin  81 MG chewable tablet, Chew 1 tablet (81 mg total) by mouth daily., Disp: , Rfl:    atorvastatin  (LIPITOR) 40 MG tablet, Take 1 tablet (40 mg total) by mouth daily., Disp: 90 tablet, Rfl: 1   CINNAMON  PO, Take 1 capsule by mouth daily., Disp: , Rfl:    Coenzyme Q10 (CO Q 10 PO), Take 1 capsule by mouth daily., Disp: , Rfl:    Ferrous Sulfate (IRON PO), Take 1 tablet by mouth daily., Disp: , Rfl:    levETIRAcetam  (KEPPRA ) 1000 MG tablet, Take 1 tablet (1,000 mg total) by mouth 2 (two) times daily., Disp: , Rfl:    MAGNESIUM  PO, Take 1 tablet by mouth at bedtime., Disp: , Rfl:    Multiple Vitamin (MULTIVITAMIN WITH MINERALS) TABS tablet, Take 1 tablet by mouth daily. Liposomal multivitamin/minerals, Disp: , Rfl:    OVER THE COUNTER MEDICATION, Take 1 tablet by mouth daily. OTC vitamin C + quercetin, Disp: , Rfl:    Study - LIBREXIA-STROKE - milvexian 25 mg or placebo tablet (PI-Sethi), Take 1 tablet by mouth 2 (two) times daily., Disp: 280 tablet, Rfl: 0  Vitals   Vitals:   09/03/23 1602 09/03/23 1630  BP: (!) 154/109 (!) 124/95  Pulse: (!) 105 89  Resp:  14  Temp:  98.5 F (36.9 C)  TempSrc:  Oral  SpO2: (!) 89% 99%    There is no height or weight on file to calculate BMI.   Physical Exam   Constitutional: Appears well-developed and well-nourished.  Psych: Affect appropriate to situation.  Eyes: No scleral injection.  HENT: No OP obstruction.  Head: Normocephalic.  Cardiovascular: Normal rate and regular rhythm.  Respiratory: Effort normal, non-labored breathing.  Skin: WDI.   Neurologic Examination    NEURO:  Mental Status: Patient is  alert and oriented to person and place, gives wrong month but correct year and is unable to give history of her present complaint Speech/Language: speech is without dysarthria but with moderate expressive aphasia, unable to name some objects  Cranial Nerves:  II: PERRL.  Left hemianopsia III, IV, VI: Right gaze deviation, unable to cross midline V: Sensation is intact to light  touch and symmetrical to face.  VII: Left facial droop VIII: hearing intact to voice. IX, X:  Phonation is normal.  XII: tongue is midline without fasciculations. Motor: Drift noted in bilateral upper and lower extremities, right greater than left Tone: is normal and bulk is normal Sensation- Intact to light touch bilaterally. Extinction absent to light touch to DSS.  Coordination: FTN intact bilaterally Gait- deferred   Labs/Imaging/Neurodiagnostic studies   CBC:  Recent Labs  Lab September 17, 2023 1611 09-17-23 1625  WBC  --  7.5  NEUTROABS  --  5.0  HGB 12.9 12.6  HCT 38.0 39.6  MCV  --  93.2  PLT  --  301   Basic Metabolic Panel:  Lab Results  Component Value Date   NA 140 09-17-23   K 4.5 2023/09/17   CO2 24 08/22/2023   GLUCOSE 110 (H) 09/17/2023   BUN 29 (H) 09/17/2023   CREATININE 0.90 2023-09-17   CALCIUM  8.9 08/22/2023   GFRNONAA >60 08/22/2023   GFRAA 78 07/20/2019   Lipid Panel:  Lab Results  Component Value Date   LDLCALC 99 07/01/2023   HgbA1c:  Lab Results  Component Value Date   HGBA1C 5.5 07/01/2023   Urine Drug Screen:     Component Value Date/Time   LABOPIA NONE DETECTED 08/15/2023 1024   COCAINSCRNUR NONE DETECTED 08/15/2023 1024   LABBENZ POSITIVE (A) 08/15/2023 1024   AMPHETMU NONE DETECTED 08/15/2023 1024   THCU NONE DETECTED 08/15/2023 1024   LABBARB NONE DETECTED 08/15/2023 1024    Alcohol Level     Component Value Date/Time   ETH <15 08/15/2023 1020   INR  Lab Results  Component Value Date   INR 1.1 06/30/2023   APTT  Lab Results  Component Value  Date   APTT 30 06/30/2023   Keppra  level pending  CT Head without contrast(Personally reviewed): No acute abnormality, atrophy and chronic small vessel ischemic disease   Neurodiagnostics Continuous EEG:  Pending  ASSESSMENT   Jacqueline Bonilla is a 68 y.o. 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  She had a recent admission last month for status epilepticus.  Patient resides in a rehabilitation facility and per husband has been working on her gait and has baseline left hemianopsia and right hemiparesis.  She had an unwitnessed fall last night at about 130.  Today, while her husband was visiting, he noted that she became aphasic with increased left-sided weakness and brought her to the hospital.  Upon arrival, she was noted to have a right gaze deviation, notable aphasia and left hemianopsia.  CT head was negative for acute abnormality, and previous seizures did consist of right gaze deviation and right upper extremity twitching.  Right gaze deviation did resolve spontaneously while patient was in the CT scanner.  At this time, aphasia improved as well.  Suspect that patient's fall last night may have also been due to an unwitnessed seizure.  Will check Keppra  level, resume home dose of Keppra  and connect patient to LTM EEG, as subclinical seizures could have caused her aphasia.  Seizure semiology of R gaze deviation. There are reports of both either R or L lower facial droop, as well as RUE seizure activity. GTC also noted.   RECOMMENDATIONS  -Check Keppra  level - LTM EEG - Continue Keppra  1000 mg twice daily - If Keppra  level therapeutic, plan to start Depakote . If sub-therapeutic,  plan to load (with renal function considered) ---- Previously was supposed to be on Depakote , however was non-compliant. Patient and husband denies known side  effects from it. Amenable to starting it as per plan above.  - Seizure precautions ______________________________________________________________________  Patient seen by NP with MD, MD to edit note as needed.  Signed, Cortney E Everitt Clint Kill, NP [facility]   NEUROHOSPITALIST ADDENDUM Performed a face to face diagnostic evaluation.   I have reviewed the contents of history and physical exam as documented by PA/ARNP/Resident and agree with above documentation.  I have discussed and formulated the above plan as documented. Edits to the note have been made as needed.  Dillian Feig, MD [facility]

## 2023-09-03 NOTE — ED Notes (Signed)
 Assisted patient onto bedpan and off bedpan. Patient tolerated well.

## 2023-09-03 NOTE — ED Triage Notes (Signed)
 RN and NT was asked by security to help pt out of car. NT and RN assisted pt into wheelchair. Husband states pt had unwitnessed fall last night, ubclear if pt hit head. States she is on blood thinners; Husband states at 1100 he noticed pt having left sided weakness and increased aphasia. Stroke nurse witnessed conversation.

## 2023-09-04 ENCOUNTER — Observation Stay (HOSPITAL_COMMUNITY)

## 2023-09-04 ENCOUNTER — Other Ambulatory Visit: Payer: Self-pay

## 2023-09-04 ENCOUNTER — Encounter (HOSPITAL_COMMUNITY)

## 2023-09-04 ENCOUNTER — Encounter (HOSPITAL_COMMUNITY): Payer: Self-pay | Admitting: Internal Medicine

## 2023-09-04 ENCOUNTER — Inpatient Hospital Stay (HOSPITAL_COMMUNITY)

## 2023-09-04 DIAGNOSIS — F0154 Vascular dementia, unspecified severity, with anxiety: Secondary | ICD-10-CM | POA: Diagnosis present

## 2023-09-04 DIAGNOSIS — Z043 Encounter for examination and observation following other accident: Secondary | ICD-10-CM | POA: Diagnosis not present

## 2023-09-04 DIAGNOSIS — Z91128 Patient's intentional underdosing of medication regimen for other reason: Secondary | ICD-10-CM | POA: Diagnosis not present

## 2023-09-04 DIAGNOSIS — W19XXXA Unspecified fall, initial encounter: Secondary | ICD-10-CM | POA: Diagnosis present

## 2023-09-04 DIAGNOSIS — Z91013 Allergy to seafood: Secondary | ICD-10-CM | POA: Diagnosis not present

## 2023-09-04 DIAGNOSIS — Z8673 Personal history of transient ischemic attack (TIA), and cerebral infarction without residual deficits: Secondary | ICD-10-CM

## 2023-09-04 DIAGNOSIS — Z807 Family history of other malignant neoplasms of lymphoid, hematopoietic and related tissues: Secondary | ICD-10-CM | POA: Diagnosis not present

## 2023-09-04 DIAGNOSIS — Z91041 Radiographic dye allergy status: Secondary | ICD-10-CM | POA: Diagnosis not present

## 2023-09-04 DIAGNOSIS — Z8249 Family history of ischemic heart disease and other diseases of the circulatory system: Secondary | ICD-10-CM | POA: Diagnosis not present

## 2023-09-04 DIAGNOSIS — R400 Somnolence: Secondary | ICD-10-CM | POA: Diagnosis not present

## 2023-09-04 DIAGNOSIS — Y92009 Unspecified place in unspecified non-institutional (private) residence as the place of occurrence of the external cause: Secondary | ICD-10-CM | POA: Diagnosis not present

## 2023-09-04 DIAGNOSIS — I10 Essential (primary) hypertension: Secondary | ICD-10-CM | POA: Diagnosis present

## 2023-09-04 DIAGNOSIS — I69351 Hemiplegia and hemiparesis following cerebral infarction affecting right dominant side: Secondary | ICD-10-CM | POA: Diagnosis not present

## 2023-09-04 DIAGNOSIS — H53462 Homonymous bilateral field defects, left side: Secondary | ICD-10-CM | POA: Diagnosis present

## 2023-09-04 DIAGNOSIS — M810 Age-related osteoporosis without current pathological fracture: Secondary | ICD-10-CM | POA: Diagnosis present

## 2023-09-04 DIAGNOSIS — R531 Weakness: Secondary | ICD-10-CM | POA: Diagnosis present

## 2023-09-04 DIAGNOSIS — R4701 Aphasia: Secondary | ICD-10-CM | POA: Diagnosis present

## 2023-09-04 DIAGNOSIS — T426X6A Underdosing of other antiepileptic and sedative-hypnotic drugs, initial encounter: Secondary | ICD-10-CM | POA: Diagnosis present

## 2023-09-04 DIAGNOSIS — G40909 Epilepsy, unspecified, not intractable, without status epilepticus: Secondary | ICD-10-CM | POA: Diagnosis present

## 2023-09-04 DIAGNOSIS — Z751 Person awaiting admission to adequate facility elsewhere: Secondary | ICD-10-CM | POA: Diagnosis not present

## 2023-09-04 DIAGNOSIS — Z7982 Long term (current) use of aspirin: Secondary | ICD-10-CM | POA: Diagnosis not present

## 2023-09-04 DIAGNOSIS — R569 Unspecified convulsions: Secondary | ICD-10-CM | POA: Diagnosis not present

## 2023-09-04 DIAGNOSIS — F0150 Vascular dementia without behavioral disturbance: Secondary | ICD-10-CM | POA: Diagnosis present

## 2023-09-04 DIAGNOSIS — Z7409 Other reduced mobility: Secondary | ICD-10-CM | POA: Diagnosis present

## 2023-09-04 DIAGNOSIS — I69320 Aphasia following cerebral infarction: Secondary | ICD-10-CM | POA: Diagnosis not present

## 2023-09-04 DIAGNOSIS — E785 Hyperlipidemia, unspecified: Secondary | ICD-10-CM | POA: Diagnosis present

## 2023-09-04 DIAGNOSIS — Z79899 Other long term (current) drug therapy: Secondary | ICD-10-CM | POA: Diagnosis not present

## 2023-09-04 DIAGNOSIS — R29810 Facial weakness: Secondary | ICD-10-CM | POA: Diagnosis present

## 2023-09-04 LAB — CBC WITH DIFFERENTIAL/PLATELET
Abs Immature Granulocytes: 0.02 K/uL (ref 0.00–0.07)
Basophils Absolute: 0.1 K/uL (ref 0.0–0.1)
Basophils Relative: 1 %
Eosinophils Absolute: 0.2 K/uL (ref 0.0–0.5)
Eosinophils Relative: 3 %
HCT: 36.4 % (ref 36.0–46.0)
Hemoglobin: 11.5 g/dL — ABNORMAL LOW (ref 12.0–15.0)
Immature Granulocytes: 0 %
Lymphocytes Relative: 25 %
Lymphs Abs: 1.5 K/uL (ref 0.7–4.0)
MCH: 29.4 pg (ref 26.0–34.0)
MCHC: 31.6 g/dL (ref 30.0–36.0)
MCV: 93.1 fL (ref 80.0–100.0)
Monocytes Absolute: 0.6 K/uL (ref 0.1–1.0)
Monocytes Relative: 9 %
Neutro Abs: 3.6 K/uL (ref 1.7–7.7)
Neutrophils Relative %: 62 %
Platelets: 251 K/uL (ref 150–400)
RBC: 3.91 MIL/uL (ref 3.87–5.11)
RDW: 13.9 % (ref 11.5–15.5)
WBC: 5.9 K/uL (ref 4.0–10.5)
nRBC: 0 % (ref 0.0–0.2)

## 2023-09-04 LAB — BASIC METABOLIC PANEL WITH GFR
Anion gap: 9 (ref 5–15)
BUN: 19 mg/dL (ref 8–23)
CO2: 23 mmol/L (ref 22–32)
Calcium: 9.1 mg/dL (ref 8.9–10.3)
Chloride: 105 mmol/L (ref 98–111)
Creatinine, Ser: 0.71 mg/dL (ref 0.44–1.00)
GFR, Estimated: >60 mL/min (ref >60)
Glucose, Bld: 101 mg/dL — ABNORMAL HIGH (ref 70–99)
Potassium: 4 mmol/L (ref 3.5–5.1)
Sodium: 137 mmol/L (ref 135–145)

## 2023-09-04 LAB — MAGNESIUM: Magnesium: 1.8 mg/dL (ref 1.7–2.4)

## 2023-09-04 MED ORDER — ASPIRIN 81 MG PO CHEW
81.0000 mg | CHEWABLE_TABLET | Freq: Every day | ORAL | Status: DC
  Administered 2023-09-04 – 2023-09-12 (×9): 81 mg via ORAL
  Filled 2023-09-04 (×9): qty 1

## 2023-09-04 MED ORDER — VALPROATE SODIUM 100 MG/ML IV SOLN
500.0000 mg | Freq: Once | INTRAVENOUS | Status: AC
  Administered 2023-09-04: 500 mg via INTRAVENOUS
  Filled 2023-09-04: qty 5

## 2023-09-04 MED ORDER — ATORVASTATIN CALCIUM 40 MG PO TABS
40.0000 mg | ORAL_TABLET | Freq: Every day | ORAL | Status: DC
  Administered 2023-09-04 – 2023-09-12 (×9): 40 mg via ORAL
  Filled 2023-09-04 (×9): qty 1

## 2023-09-04 MED ORDER — ACETAMINOPHEN 325 MG PO TABS
650.0000 mg | ORAL_TABLET | Freq: Four times a day (QID) | ORAL | Status: DC | PRN
  Administered 2023-09-04: 650 mg via ORAL
  Filled 2023-09-04: qty 2

## 2023-09-04 MED ORDER — ACETAMINOPHEN 650 MG RE SUPP: 650.0000 mg | Freq: Four times a day (QID) | RECTAL | Status: DC | PRN

## 2023-09-04 MED ORDER — DIVALPROEX SODIUM 250 MG PO DR TAB
250.0000 mg | DELAYED_RELEASE_TABLET | Freq: Two times a day (BID) | ORAL | Status: DC
  Administered 2023-09-04 – 2023-09-12 (×16): 250 mg via ORAL
  Filled 2023-09-04 (×16): qty 1

## 2023-09-04 MED ORDER — ENOXAPARIN SODIUM 40 MG/0.4ML IJ SOSY
40.0000 mg | PREFILLED_SYRINGE | INTRAMUSCULAR | Status: DC
  Administered 2023-09-04 – 2023-09-12 (×9): 40 mg via SUBCUTANEOUS
  Filled 2023-09-04 (×9): qty 0.4

## 2023-09-04 NOTE — H&P (Signed)
 History and Physical    Jacqueline Bonilla FMW:[id number] DOB: [birth year] DOA: 09/03/2023   Chief Complaint: Left-sided weakness and difficulty speaking.  HPI: Jacqueline Bonilla is a 68 y.o. female with history of stroke with right-sided weakness who was recently admitted to the hospital on 08/15/2023 for status epilepticus at that time patient also was intubated due to respiratory failure in the setting of status epilepticus eventually discharged to rehab on 08/26/2023 on Keppra  was brought to the ER after patient was found to have left-sided weakness with difficulty speaking.  Patient prior to this as per the report had a fall in the rehab with some bleeding from her lips at around 1:30 AM yesterday morning  ED Course: In the ER patient was evaluated by neurologist.  CT head was unremarkable.  Per neurology patient had some right gaze deviation which resolved spontaneously.  And patient's aphasia improved.  Neurology feels that patient's fall early in the morning could have been related to her seizures.  Neurology planning to get Keppra  levels and the continuous EEG and admit for further observation.  Labs are largely unremarkable.  EG shows normal sinus rhythm.  At the time of my exam patient is well-oriented to time place and person.  Review of Systems: As per HPI, rest all negative.   Past Medical History:  Diagnosis Date   Anemia    Anxiety    Depression    Dysmenorrhea    Endometriosis    Fibroid    Hypertension    Implantable loop recorder present 2021   Microhematuria    negative workup   Osteoporosis    Tachycardia    Thrombocytopenia (HCC)     Past Surgical History:  Procedure Laterality Date   BREAST BIOPSY     BUBBLE STUDY  12/08/2020   Procedure: BUBBLE STUDY;  Surgeon: Delford Maude BROCKS, MD;  Location: [facility];  Service: Cardiovascular;;   CESAREAN SECTION     hysteroscopic resection     implantable loop recorder implant  10/21/2019   Medtronic Reveal Linq model  LNQ 22 ([place] [id number]) implantable loop recorder   IR CT HEAD LTD  12/01/2020   IR PERCUTANEOUS ART THROMBECTOMY/INFUSION INTRACRANIAL INC DIAG ANGIO  12/01/2020   IR US  GUIDE VASC ACCESS RIGHT  12/01/2020   ORIF HUMERUS FRACTURE Left 07/31/2021   Procedure: OPEN REDUCTION INTERNAL FIXATION (ORIF) DISTAL HUMERUS FRACTURE;  Surgeon: Kendal Franky SHAUNNA, MD;  Location: [facility] OR;  Service: Orthopedics;  Laterality: Left;   RADIOLOGY WITH ANESTHESIA N/A 12/01/2020   Procedure: IR WITH ANESTHESIA - CODE STROKE;  Surgeon: Radiologist, Medication, MD;  Location: [facility] OR;  Service: Radiology;  Laterality: N/A;   TEE WITHOUT CARDIOVERSION N/A 12/08/2020   Procedure: TRANSESOPHAGEAL ECHOCARDIOGRAM (TEE);  Surgeon: Delford Maude BROCKS, MD;  Location: [facility] ENDOSCOPY;  Service: Cardiovascular;  Laterality: N/A;     reports that she has never smoked. She has never used smokeless tobacco. She reports that she does not drink alcohol and does not use drugs.  Allergies  Allergen Reactions   Iodinated Contrast Media Hives   Iohexol  Hives   Plavix  [Clopidogrel ] Diarrhea   Quinolones Hives    Reaction with Cipro, Levaquin, Avelox   Shellfish Allergy Itching    Family History  Problem Relation Age of Onset   Cancer Father        non hodgkin lymphoma & skin   Heart attack Maternal Grandfather    Dementia Mother    Polymyositis Sister     Prior to Admission  medications   Medication Sig Start Date End Date Taking? Authorizing Provider  aspirin  81 MG chewable tablet Chew 1 tablet (81 mg total) by mouth daily. 04/02/23   Remi Pippin, NP  atorvastatin  (LIPITOR) 40 MG tablet Take 1 tablet (40 mg total) by mouth daily. 05/29/23   Purcell Emil Schanz, MD  CINNAMON  PO Take 1 capsule by mouth daily.    [provider]  Coenzyme Q10 (CO Q 10 PO) Take 1 capsule by mouth daily.    [provider]  Ferrous Sulfate (IRON PO) Take 1 tablet by mouth daily.    [provider]  levETIRAcetam  (KEPPRA ) 1000 MG  tablet Take 1 tablet (1,000 mg total) by mouth 2 (two) times daily. 08/26/23   Perri DELENA Meliton Mickey., MD  MAGNESIUM  PO Take 1 tablet by mouth at bedtime.    [provider]  Multiple Vitamin (MULTIVITAMIN WITH MINERALS) TABS tablet Take 1 tablet by mouth daily. Liposomal multivitamin/minerals    [provider]  OVER THE COUNTER MEDICATION Take 1 tablet by mouth daily. OTC vitamin C + quercetin    [provider]  Study - LIBREXIA-STROKE - milvexian 25 mg or placebo tablet (PI-Sethi) Take 1 tablet by mouth 2 (two) times daily. 06/26/23   Rosemarie Eather RAMAN, MD    Physical Exam: Constitutional: Moderately built and nourished. Vitals:   09/03/23 2100 09/03/23 2132 09/04/23 0200 09/04/23 0228  BP: 133/76  127/80   Pulse: 76  99   Resp: 13  15   Temp:  98 F (36.7 C)  98.5 F (36.9 C)  TempSrc:  Oral  Oral  SpO2: 100%  93%    Eyes: Anicteric no pallor. ENMT: No discharge from the ears eyes nose or mouth. Neck: No mass felt.  No neck rigidity. Respiratory: No rhonchi or crepitations. Cardiovascular: S1-S2 heard. Abdomen: Soft nontender bowel sound present. Musculoskeletal: No edema. Skin: No rash. Neurologic: Alert awake oriented time place and person.  Mild weakness of the right upper and lower extremities.  Able to move her left upper and lower extremities 5 x 5.  No facial asymmetry.  Pupils are equal reacting to light. Psychiatric: Appears normal.  Normal affect.   Labs on Admission: I have personally reviewed following labs and imaging studies  CBC: Recent Labs  Lab 09/03/23 1611 09/03/23 1625  WBC  --  7.5  NEUTROABS  --  5.0  HGB 12.9 12.6  HCT 38.0 39.6  MCV  --  93.2  PLT  --  301   Basic Metabolic Panel: Recent Labs  Lab 09/03/23 1611 09/03/23 1625  NA 140 137  K 4.5 4.8  CL 103 103  CO2  --  22  GLUCOSE 110* 102*  BUN 29* 25*  CREATININE 0.90 0.78  CALCIUM   --  9.5   GFR: Estimated Creatinine Clearance: 51 mL/min (by C-G  formula based on SCr of 0.78 mg/dL). Liver Function Tests: Recent Labs  Lab 09/03/23 1625  AST 31  ALT 22  ALKPHOS 47  BILITOT 0.9  PROT 7.9  ALBUMIN 3.6   No results for input(s): LIPASE, AMYLASE in the last 168 hours. No results for input(s): AMMONIA in the last 168 hours. Coagulation Profile: Recent Labs  Lab 09/03/23 1625  INR 1.1   Cardiac Enzymes: No results for input(s): CKTOTAL, CKMB, CKMBINDEX, TROPONINI in the last 168 hours. BNP (last 3 results) No results for input(s): PROBNP in the last 8760 hours. HbA1C: No results for input(s): HGBA1C in the last  72 hours. CBG: Recent Labs  Lab 09/03/23 1601  GLUCAP 104*   Lipid Profile: No results for input(s): CHOL, HDL, LDLCALC, TRIG, CHOLHDL, LDLDIRECT in the last 72 hours. Thyroid  Function Tests: No results for input(s): TSH, T4TOTAL, FREET4, T3FREE, THYROIDAB in the last 72 hours. Anemia Panel: No results for input(s): VITAMINB12, FOLATE, FERRITIN, TIBC, IRON, RETICCTPCT in the last 72 hours. Urine analysis:    Component Value Date/Time   COLORURINE YELLOW 08/15/2023 1024   APPEARANCEUR CLEAR 08/15/2023 1024   APPEARANCEUR Clear 08/08/2017 0915   LABSPEC 1.012 08/15/2023 1024   PHURINE 6.0 08/15/2023 1024   GLUCOSEU NEGATIVE 08/15/2023 1024   HGBUR NEGATIVE 08/15/2023 1024   BILIRUBINUR NEGATIVE 08/15/2023 1024   BILIRUBINUR negative 07/06/2022 1658   BILIRUBINUR Negative 08/08/2017 0915   KETONESUR NEGATIVE 08/15/2023 1024   PROTEINUR NEGATIVE 08/15/2023 1024   UROBILINOGEN 0.2 07/06/2022 1658   NITRITE NEGATIVE 08/15/2023 1024   LEUKOCYTESUR NEGATIVE 08/15/2023 1024   Sepsis Labs: @LABRCNTIP (procalcitonin:4,lacticidven:4) )No results found for this or any previous visit (from the past 240 hours).   Radiological Exams on Admission: CT HEAD CODE STROKE WO CONTRAST Result Date: 09/03/2023 CLINICAL DATA:  Code stroke. Neuro deficit, acute, stroke  suspected. Right-sided gaze. Right arm weakness. History of stroke. EXAM: CT HEAD WITHOUT CONTRAST TECHNIQUE: Contiguous axial images were obtained from the base of the skull through the vertex without intravenous contrast. RADIATION DOSE REDUCTION: This exam was performed according to the departmental dose-optimization program which includes automated exposure control, adjustment of the mA and/or kV according to patient size and/or use of iterative reconstruction technique. COMPARISON:  Brain MRI 08/16/2023. FINDINGS: Brain: Cerebral atrophy. Prominence of the ventricles and sulci, which appears commensurate. Patchy chronic cortical/subcortical infarcts within the bilateral frontal, parietal and occipital lobes, not appreciably changed from the prior brain MRI of 08/16/2023. Chronic lacunar infarcts within the bilateral deep gray nuclei, and small chronic infarcts within the bilateral cerebellar hemispheres, also not appreciably changed. Background moderate patchy and ill-defined hypoattenuation within the cerebral white matter, nonspecific but compatible with chronic small vessel ischemic disease. There is no acute intracranial hemorrhage. No acute demarcated cortical infarct. No extra-axial fluid collection. No evidence of an intracranial mass. No midline shift. Vascular: No hyperdense vessel.  Atherosclerotic calcifications. Skull: No calvarial fracture or aggressive osseous lesion. Sinuses/Orbits: No mass or acute finding within the imaged orbits. No significant paranasal sinus disease at the imaged levels. ASPECTS [facility] Stroke Program Early CT Score) - Ganglionic level infarction (caudate, lentiform nuclei, internal capsule, insula, M1-M3 cortex): 7 - Supraganglionic infarction (M4-M6 cortex): 3 Total score (0-10 with 10 being normal): 10 No evidence of an acute intracranial abnormality. These results were communicated to Dr. Sallyann at 4:25 pmon 9/2/2025by text page via the [facility] messaging system. IMPRESSION:  1. No evidence of an acute intracranial abnormality. 2. Parenchymal atrophy, chronic small vessel ischemic disease and unchanged chronic infarcts, as described. Electronically Signed   By: Rockey Childs D.O.   On: 09/03/2023 16:25    EKG: Independently reviewed.  Normal sinus rhythm.  Assessment/Plan Principal Problem:   Seizure [facility]) Active Problems:   Essential hypertension   History of CVA (cerebrovascular accident)   Dyslipidemia    Seizures with concern for possible breakthrough seizures appreciate neurology consult.  Plan is to place patient on continuous EEG and continue present Keppra  dose check Keppra  levels.  Further recommendations per neurologist. History of stroke on statins and aspirin . History of hypertension presently not on antihypertensives due to low normal blood pressure during  recent admission.  Follow blood pressure trends.  Patient has possible breakthrough seizures will need close monitoring and more than 2 midnight stay.   X-ray of the pelvis is pending.   DVT prophylaxis: Lovenox . Code Status: Full code. Family Communication: Discussed with patient. Disposition Plan: Monitored bed. Consults called: Neurology. Admission status: Observation.

## 2023-09-04 NOTE — Progress Notes (Signed)
 LTM EEG hooked up and running - no initial skin breakdown - push button tested. Unmonitored at this time. Pt is in ED  CT Leads used

## 2023-09-04 NOTE — Progress Notes (Signed)
 PROGRESS NOTE    Jacqueline Bonilla  FMW:[id number] DOB: Apr 10, 1955 DOA: 09/03/2023 PCP: Purcell Emil Schanz, MD   Brief Narrative:  HPI: Jacqueline Bonilla is a 68 y.o. female with history of stroke with right-sided weakness who was recently admitted to the hospital on 08/15/2023 for status epilepticus at that time patient also was intubated due to respiratory failure in the setting of status epilepticus eventually discharged to rehab on 08/26/2023 on Keppra  was brought to the ER after patient was found to have left-sided weakness with difficulty speaking.  Patient prior to this as per the report had a fall in the rehab with some bleeding from her lips at around 1:30 AM yesterday morning   ED Course: In the ER patient was evaluated by neurologist.  CT head was unremarkable.  Per neurology patient had some right gaze deviation which resolved spontaneously.  And patient's aphasia improved.  Neurology feels that patient's fall early in the morning could have been related to her seizures.  Neurology planning to get Keppra  levels and the continuous EEG and admit for further observation.  Labs are largely unremarkable.  EG shows normal sinus rhythm.  At the time of my exam patient is well-oriented to time place and person.  Assessment & Plan:   Principal Problem:   Seizure [facility]) Active Problems:   Essential hypertension   History of CVA (cerebrovascular accident)   Dyslipidemia  Breakthrough seizures: Recent hospitalization for status epilepticus, patient was on Depakote  prior to that admission but was discharged on Keppra  this time.  She is feeling better.  She did not have any dysarthria.  She had global weakness but no focal left-sided weakness on my exam today.  Patient was slightly lethargic but still fully oriented.  Denied any complaints.  Neurology is following her.  Keppra  level is pending.  She remains on home dose of Keppra  and LTM EEG.  Appreciate neurology help.  History of stroke with  residual right-sided weakness/hyperlipidemia: Asymptomatic.  Continue statin and aspirin .  Essential hypertension: Blood pressure controlled, she is not on any medications for this.  DVT prophylaxis: enoxaparin  (LOVENOX ) injection 40 mg Start: 09/04/23 1000   Code Status: Full Code  Family Communication:  None present at bedside.  Plan of care discussed with patient in length and he/she verbalized understanding and agreed with it.  Status is: Observation The patient will require care spanning > 2 midnights and should be moved to inpatient because: Needs LTM EEG   Estimated body mass index is 18.69 kg/m as calculated from the following:   Height as of 08/18/23: 5' 4 (1.626 m).   Weight as of 08/24/23: 49.4 kg.    Nutritional Assessment: There is no height or weight on file to calculate BMI.. Seen by dietician.  I agree with the assessment and plan as outlined below: Nutrition Status:        . Skin Assessment: I have examined the patient's skin and I agree with the wound assessment as performed by the wound care RN as outlined below:    Consultants:  Neurology  Procedures:  None  Antimicrobials:  Anti-infectives (From admission, onward)    None         Subjective: Seen and examined in the ED, she was lethargic but arousable and fully oriented once awake.  No complaints today.  Weakness and speech difficulties have improved.  Objective: Vitals:   09/04/23 0345 09/04/23 0400 09/04/23 0600 09/04/23 0610  BP:  138/76 (!) 146/93   Pulse: 82 77  82   Resp: 12 10 11    Temp:    98.9 F (37.2 C)  TempSrc:    Oral  SpO2: 100% 100% 100%    No intake or output data in the 24 hours ending 09/04/23 0945 There were no vitals filed for this visit.  Examination:  General exam: Appears calm and comfortable  Respiratory system: Clear to auscultation. Respiratory effort normal. Cardiovascular system: S1 & S2 heard, RRR. No JVD, murmurs, rubs, gallops or clicks. No pedal  edema. Gastrointestinal system: Abdomen is nondistended, soft and nontender. No organomegaly or masses felt. Normal bowel sounds heard. Central nervous system: Alert and oriented. No focal neurological deficits. Extremities: Symmetric 5 x 5 power. Skin: No rashes, lesions or ulcers Psychiatry: Judgement and insight appear normal. Mood & affect appropriate.    Data Reviewed: I have personally reviewed following labs and imaging studies  CBC: Recent Labs  Lab 09/03/23 1611 09/03/23 1625 09/04/23 0523  WBC  --  7.5 5.9  NEUTROABS  --  5.0 3.6  HGB 12.9 12.6 11.5*  HCT 38.0 39.6 36.4  MCV  --  93.2 93.1  PLT  --  301 251   Basic Metabolic Panel: Recent Labs  Lab 09/03/23 1611 09/03/23 1625 09/04/23 0523  NA 140 137 137  K 4.5 4.8 4.0  CL 103 103 105  CO2  --  22 23  GLUCOSE 110* 102* 101*  BUN 29* 25* 19  CREATININE 0.90 0.78 0.71  CALCIUM   --  9.5 9.1  MG  --   --  1.8   GFR: Estimated Creatinine Clearance: 51 mL/min (by C-G formula based on SCr of 0.71 mg/dL). Liver Function Tests: Recent Labs  Lab 09/03/23 1625  AST 31  ALT 22  ALKPHOS 47  BILITOT 0.9  PROT 7.9  ALBUMIN 3.6   No results for input(s): LIPASE, AMYLASE in the last 168 hours. No results for input(s): AMMONIA in the last 168 hours. Coagulation Profile: Recent Labs  Lab 09/03/23 1625  INR 1.1   Cardiac Enzymes: No results for input(s): CKTOTAL, CKMB, CKMBINDEX, TROPONINI in the last 168 hours. BNP (last 3 results) No results for input(s): PROBNP in the last 8760 hours. HbA1C: No results for input(s): HGBA1C in the last 72 hours. CBG: Recent Labs  Lab 09/03/23 1601  GLUCAP 104*   Lipid Profile: No results for input(s): CHOL, HDL, LDLCALC, TRIG, CHOLHDL, LDLDIRECT in the last 72 hours. Thyroid  Function Tests: No results for input(s): TSH, T4TOTAL, FREET4, T3FREE, THYROIDAB in the last 72 hours. Anemia Panel: No results for input(s):  VITAMINB12, FOLATE, FERRITIN, TIBC, IRON, RETICCTPCT in the last 72 hours. Sepsis Labs: No results for input(s): PROCALCITON, LATICACIDVEN in the last 168 hours.  No results found for this or any previous visit (from the past 240 hours).   Radiology Studies: DG Pelvis 1-2 Views Result Date: 09/04/2023 EXAM: 1 or 2 VIEW(S) XRAY OF THE PELVIS 09/04/2023 06:38:00 AM COMPARISON: 08/15/2023 CLINICAL HISTORY: [id number] Fall [id number]. Reason for exam: fall FINDINGS: BONES AND JOINTS: No acute fracture. No focal osseous lesion. No joint dislocation. SOFT TISSUES: The soft tissues are unremarkable. IMPRESSION: 1. No evidence of acute traumatic injury. Electronically signed by: Evalene Coho MD 09/04/2023 06:45 AM EDT RP Workstation: [id number]   CT HEAD CODE STROKE WO CONTRAST Result Date: 09/03/2023 CLINICAL DATA:  Code stroke. Neuro deficit, acute, stroke suspected. Right-sided gaze. Right arm weakness. History of stroke. EXAM: CT HEAD WITHOUT CONTRAST TECHNIQUE: Contiguous axial images were obtained from the base of  the skull through the vertex without intravenous contrast. RADIATION DOSE REDUCTION: This exam was performed according to the departmental dose-optimization program which includes automated exposure control, adjustment of the mA and/or kV according to patient size and/or use of iterative reconstruction technique. COMPARISON:  Brain MRI 08/16/2023. FINDINGS: Brain: Cerebral atrophy. Prominence of the ventricles and sulci, which appears commensurate. Patchy chronic cortical/subcortical infarcts within the bilateral frontal, parietal and occipital lobes, not appreciably changed from the prior brain MRI of 08/16/2023. Chronic lacunar infarcts within the bilateral deep gray nuclei, and small chronic infarcts within the bilateral cerebellar hemispheres, also not appreciably changed. Background moderate patchy and ill-defined hypoattenuation within the cerebral white matter, nonspecific but  compatible with chronic small vessel ischemic disease. There is no acute intracranial hemorrhage. No acute demarcated cortical infarct. No extra-axial fluid collection. No evidence of an intracranial mass. No midline shift. Vascular: No hyperdense vessel.  Atherosclerotic calcifications. Skull: No calvarial fracture or aggressive osseous lesion. Sinuses/Orbits: No mass or acute finding within the imaged orbits. No significant paranasal sinus disease at the imaged levels. ASPECTS [facility] Stroke Program Early CT Score) - Ganglionic level infarction (caudate, lentiform nuclei, internal capsule, insula, M1-M3 cortex): 7 - Supraganglionic infarction (M4-M6 cortex): 3 Total score (0-10 with 10 being normal): 10 No evidence of an acute intracranial abnormality. These results were communicated to Dr. Sallyann at 4:25 pmon 9/2/2025by text page via the [facility] messaging system. IMPRESSION: 1. No evidence of an acute intracranial abnormality. 2. Parenchymal atrophy, chronic small vessel ischemic disease and unchanged chronic infarcts, as described. Electronically Signed   By: Rockey Childs D.O.   On: 09/03/2023 16:25    Scheduled Meds:  aspirin   81 mg Oral Daily   atorvastatin   40 mg Oral Daily   enoxaparin  (LOVENOX ) injection  40 mg Subcutaneous Q24H   levETIRAcetam   1,000 mg Intravenous Q12H   sodium chloride  flush  3 mL Intravenous Once   Continuous Infusions:   LOS: 0 days   Fredia Skeeter, MD [facility]  09/04/2023, 9:45 AM  Total time spent 40 minutes *Please note that this is a verbal dictation therefore any spelling or grammatical errors are due to the Dragon [employer] system interpretation.  Please page via Amion and do not message via secure chat for urgent patient care matters. Secure chat can be used for non urgent patient care matters.  How to contact the [facility] Attending or Consulting provider 7A - 7P or covering provider during after hours 7P -7A, for this patient?  Check the care team in  [facility] and look for a) attending/consulting [facility] provider listed and b) the [facility] team listed. Page or secure chat 7A-7P. Log into [email or web address] and use [place]'s universal password to access. If you do not have the password, please contact the hospital operator. Locate the [facility] provider you are looking for under [facility] and page to a number that you can be directly reached. If you still have difficulty reaching the provider, please page the [facility] (Director on Call) for the Hospitalists listed on amion for assistance.

## 2023-09-04 NOTE — ED Notes (Signed)
 Spoke to Walsh, Charity fundraiser from [employer] to give update on pt

## 2023-09-04 NOTE — Progress Notes (Signed)
 NEUROLOGY CONSULT FOLLOW UP NOTE   Date of service: September 04, 2023 Patient Name: Jacqueline Bonilla MRN:  [id number] DOB:  July 11, 1955  Interval Hx/subjective   Gaze is midline this morning. Patient is also alert and oriented x 4. States she does not believe she missed any of her meds at her facility.   Seizure semiology of R gaze deviation. There are reports of both either R or L lower facial droop, as well as RUE seizure activity. GTC also noted.   Vitals   Vitals:   09/04/23 0345 09/04/23 0400 09/04/23 0600 09/04/23 0610  BP:  138/76 (!) 146/93   Pulse: 82 77 82   Resp: 12 10 11    Temp:    98.9 F (37.2 C)  TempSrc:    Oral  SpO2: 100% 100% 100%      There is no height or weight on file to calculate BMI.  Physical Exam   Constitutional: Appears well-developed and well-nourished.  Psych: Affect appropriate to situation.  Eyes: No scleral injection.  HENT: No OP obstruction.  Head: Normocephalic.  Cardiovascular: Normal rate and regular rhythm.  Respiratory: Effort normal, non-labored breathing.  Skin: WDI.    Neurologic Examination      NEURO:  Mental Status: Patient is alert and oriented to person, place, month, year, and situation. States that she had a stroke and now needs and EEG.  Speech/Language: speech is without dysarthria or aphasia.    Cranial Nerves:  II: PERRL.  Left hemianopsia III, IV, VI: Eyes midline, EOM intact  V: Sensation is intact to light touch and symmetrical to face.  VII: Left facial droop VIII: hearing intact to voice. IX, X:  Phonation is normal.  XII: tongue is midline without fasciculations. Motor: Drift noted in bilateral upper and lower extremities, right greater than left Tone: is slightly increased and bulk is normal Sensation- Intact to light touch bilaterally. Extinction absent to light touch to DSS.  Coordination: FTN intact bilaterally Gait- deferred  Medications  Current Facility-Administered Medications:     acetaminophen  (TYLENOL ) tablet 650 mg, 650 mg, Oral, Q6H PRN **OR** acetaminophen  (TYLENOL ) suppository 650 mg, 650 mg, Rectal, Q6H PRN, Franky Redia SAILOR, MD   aspirin  chewable tablet 81 mg, 81 mg, Oral, Daily, Franky Redia SAILOR, MD   atorvastatin  (LIPITOR) tablet 40 mg, 40 mg, Oral, Daily, Franky Redia SAILOR, MD   enoxaparin  (LOVENOX ) injection 40 mg, 40 mg, Subcutaneous, Q24H, Franky Redia SAILOR, MD   levETIRAcetam  (KEPPRA ) undiluted injection 1,000 mg, 1,000 mg, Intravenous, Q12H, de Clint Kill, [place] E, NP, 1,000 mg at 09/04/23 9486   sodium chloride  flush (NS) 0.9 % injection 3 mL, 3 mL, Intravenous, Once, Armenta Canning, MD  Current Outpatient Medications:    aspirin  81 MG chewable tablet, Chew 1 tablet (81 mg total) by mouth daily., Disp: , Rfl:    atorvastatin  (LIPITOR) 40 MG tablet, Take 1 tablet (40 mg total) by mouth daily., Disp: 90 tablet, Rfl: 1   CINNAMON  PO, Take 1 capsule by mouth daily., Disp: , Rfl:    Coenzyme Q10 (CO Q 10 PO), Take 1 capsule by mouth daily., Disp: , Rfl:    Ferrous Sulfate (IRON PO), Take 1 tablet by mouth daily., Disp: , Rfl:    levETIRAcetam  (KEPPRA ) 1000 MG tablet, Take 1 tablet (1,000 mg total) by mouth 2 (two) times daily., Disp: , Rfl:    MAGNESIUM  PO, Take 1 tablet by mouth at bedtime., Disp: , Rfl:    Multiple Vitamin (MULTIVITAMIN WITH MINERALS)  TABS tablet, Take 1 tablet by mouth daily. Liposomal multivitamin/minerals, Disp: , Rfl:    OVER THE COUNTER MEDICATION, Take 1 tablet by mouth daily. OTC vitamin C + quercetin, Disp: , Rfl:    Study - LIBREXIA-STROKE - milvexian 25 mg or placebo tablet (PI-Sethi), Take 1 tablet by mouth 2 (two) times daily., Disp: 280 tablet, Rfl: 0  Labs and Diagnostic Imaging   CBC:  Recent Labs  Lab 09/03/23 1625 09/04/23 0523  WBC 7.5 5.9  NEUTROABS 5.0 3.6  HGB 12.6 11.5*  HCT 39.6 36.4  MCV 93.2 93.1  PLT 301 251    Basic Metabolic Panel:  Lab Results  Component Value Date   NA 137  09/04/2023   K 4.0 09/04/2023   CO2 23 09/04/2023   GLUCOSE 101 (H) 09/04/2023   BUN 19 09/04/2023   CREATININE 0.71 09/04/2023   CALCIUM  9.1 09/04/2023   GFRNONAA >60 09/04/2023   GFRAA 78 07/20/2019   Lipid Panel:  Lab Results  Component Value Date   LDLCALC 99 07/01/2023   HgbA1c:  Lab Results  Component Value Date   HGBA1C 5.5 07/01/2023   Urine Drug Screen:     Component Value Date/Time   LABOPIA NONE DETECTED 08/15/2023 1024   COCAINSCRNUR NONE DETECTED 08/15/2023 1024   LABBENZ POSITIVE (A) 08/15/2023 1024   AMPHETMU NONE DETECTED 08/15/2023 1024   THCU NONE DETECTED 08/15/2023 1024   LABBARB NONE DETECTED 08/15/2023 1024    Alcohol Level     Component Value Date/Time   ETH <15 09/03/2023 1626   INR  Lab Results  Component Value Date   INR 1.1 09/03/2023   APTT  Lab Results  Component Value Date   APTT 38 (H) 09/03/2023   AED levels: No results found for: PHENYTOIN, ZONISAMIDE, LAMOTRIGINE, LEVETIRACETA  Keppra  Level Pending   CT Head without contrast(Personally reviewed): No acute abnormality, atrophy and chronic small vessel ischemic disease  EEG:  Pending  Assessment   Jacqueline Bonilla is a 68 y.o. 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  She had a recent admission last month for status epilepticus.  Patient resides in a rehabilitation facility and per husband has been working on her gait and has baseline left hemianopsia and right hemiparesis.  She became aphasic with increased left-sided weakness and brought her to the hospital.  Upon arrival, she was noted to have a right gaze deviation, notable aphasia and left hemianopsia.  CT head was negative for acute abnormality, and previous seizures did consist of right gaze deviation and right upper extremity twitching.  Right gaze deviation did  resolve spontaneously while patient was in the CT scanner.  Suspect that patient's fall may have also been due to an unwitnessed seizure. As she has been compliant with Keppra  and due to her renal function, will not increase any further & will start Depakote  today as she and husband deny any known side effects from it.    Recommendations  - Keppra  level pending - LTM EEG - Continue Keppra  1000 mg twice daily - Start Depakote  (load 500mg  now, start 250mg  BID as maintenance) - Seizure precautions ______________________________________________________________________   Bonney Jorene Last, NP [facility]   NEUROHOSPITALIST ADDENDUM Performed a face to face diagnostic evaluation.   I have reviewed the contents of history and physical exam as documented by PA/ARNP/Resident and agree with above documentation.  I have discussed and  formulated the above plan as documented. Edits to the note have been made as needed.  Christon Parada, MD [facility]

## 2023-09-04 NOTE — Progress Notes (Signed)
 LTM maint complete - Troubleshooting machine 5 - continuously rebooting

## 2023-09-05 ENCOUNTER — Encounter: Admitting: Occupational Therapy

## 2023-09-05 ENCOUNTER — Inpatient Hospital Stay (HOSPITAL_COMMUNITY)

## 2023-09-05 DIAGNOSIS — R569 Unspecified convulsions: Secondary | ICD-10-CM | POA: Diagnosis not present

## 2023-09-05 LAB — LEVETIRACETAM LEVEL: Levetiracetam Lvl: 43.6 ug/mL — ABNORMAL HIGH (ref 10.0–40.0)

## 2023-09-05 MED ORDER — LEVETIRACETAM 500 MG PO TABS
1000.0000 mg | ORAL_TABLET | Freq: Two times a day (BID) | ORAL | Status: DC
  Administered 2023-09-05 – 2023-09-12 (×15): 1000 mg via ORAL
  Filled 2023-09-05 (×15): qty 2

## 2023-09-05 NOTE — NC FL2 (Signed)
 [place]  MEDICAID [id number] LEVEL OF CARE FORM     IDENTIFICATION  Patient Name: Jacqueline Bonilla Birthdate: 05-23-1955 Sex: female Admission Date (Current Location): 09/03/2023  [facility] and [place] Number:  Producer, television/film/video and Address:  [facility] [place], [address] [place], [name] [address]      Provider Number: [id number]  Attending Physician Name and Address:  Vernon Ranks, MD  Relative Name and Phone Number:       Current Level of Care: Hospital Recommended Level of Care: Skilled Nursing Facility Prior Approval Number:    Date Approved/Denied:   PASRR Number: [id number]  Discharge Plan: SNF    Current Diagnoses: Patient Active Problem List   Diagnosis Date Noted   Pressure injury of skin 08/19/2023   Fever 08/19/2023   Status epilepticus (HCC) 08/19/2023   Malnutrition of moderate degree 08/16/2023   Seizure (HCC) 08/15/2023   Injury of left ankle 07/25/2023   Gait disturbance, 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   Hemiparesis affecting right side as late effect of cerebrovascular accident (CVA) (HCC) 04/23/2023   Apraxia, 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   Frequent falls 02/11/2023   Statin intolerance 04/24/2022   Internal hemorrhoids 01/10/2021   Dyslipidemia 12/29/2020   Seizure-like activity (HCC) 08/30/2020   History of CVA (cerebrovascular accident) 07/14/2020   Normocytic anemia 07/11/2020   Vascular dementia without behavioral disturbance (HCC) 07/15/2019   History of COVID-19 04/22/2019   Essential hypertension 03/03/2019    Orientation RESPIRATION BLADDER Height & Weight     Self, Situation, Place  Normal Continent, Incontinent (incontinent at times) Weight:   Height:     BEHAVIORAL SYMPTOMS/MOOD NEUROLOGICAL BOWEL NUTRITION STATUS    Convulsions/Seizures Continent Diet (heart healthy)  AMBULATORY STATUS COMMUNICATION OF NEEDS Skin   Limited Assist Verbally Normal                       Personal Care Assistance Level of  Assistance  Bathing, Feeding, Dressing Bathing Assistance: Maximum assistance Feeding assistance: Limited assistance Dressing Assistance: Maximum assistance     Functional Limitations Info  Sight Sight Info: Impaired (wears glasses)        SPECIAL CARE FACTORS FREQUENCY  PT (By licensed PT), OT (By licensed OT)     PT Frequency: 5x/wk OT Frequency: 5x/wk            Contractures Contractures Info: Not present    Additional Factors Info  Code Status, Allergies, Insulin  Sliding Scale Code Status Info: Full Allergies Info: Iodinated Contrast Media, Iohexol , Plavix  (Clopidogrel ), Quinolones, Shellfish Allergy   Insulin  Sliding Scale Info: see DC summary       Current Medications (09/05/2023):  This is the current hospital active medication list Current Facility-Administered Medications  Medication Dose Route Frequency Provider Last Rate Last Admin   acetaminophen  (TYLENOL ) tablet 650 mg  650 mg Oral Q6H PRN Kakrakandy, Arshad N, MD   650 mg at 09/04/23 2016   Or   acetaminophen  (TYLENOL ) suppository 650 mg  650 mg Rectal Q6H PRN Franky Redia SAILOR, MD       aspirin  chewable tablet 81 mg  81 mg Oral Daily Franky Redia SAILOR, MD   81 mg at 09/05/23 9046   atorvastatin  (LIPITOR) tablet 40 mg  40 mg Oral Daily Kakrakandy, Arshad N, MD   40 mg at 09/05/23 9046   divalproex  (DEPAKOTE ) DR tablet 250 mg  250 mg Oral Q12H Bui, Xem M, MD   250 mg at 09/05/23 0953   enoxaparin  (LOVENOX )  injection 40 mg  40 mg Subcutaneous Q24H Franky Redia SAILOR, MD   40 mg at 09/05/23 9045   levETIRAcetam  (KEPPRA ) tablet 1,000 mg  1,000 mg Oral BID Pahwani, Ravi, MD   1,000 mg at 09/05/23 9046   sodium chloride  flush (NS) 0.9 % injection 3 mL  3 mL Intravenous Once Armenta Canning, MD         Discharge Medications: Please see discharge summary for a list of discharge medications.  Relevant Imaging Results:  Relevant Lab Results:   Additional Information SS#: [id number]  Jacqueline [name], LCSW

## 2023-09-05 NOTE — Evaluation (Signed)
 Physical Therapy Evaluation Patient Details Name: Jacqueline Bonilla MRN: [id number] DOB: [birth year] Today's Date: 09/05/2023  History of Present Illness  Jacqueline Bonilla is a 68 y.o. female who presented with right-sided facial droop and slurred speech. When she arrived in the ED, she began seizing. CT head was negative for any acute processes.  Multiple remote infarcts as well as chronic small vessel disease with parenchymal volume loss was noted. LTM EEG pending. PMH significant of 2 left MCA territory infarcts participating in the [place] trial, loop recorder placement, vascular dementia, sleep apnea, HLD, thrombocytopenia, osteoporosis, HTN, depression, anxiety and anemia  Clinical Impression  Patient presents with decreased mobility due to generalized weakness, decreased balance, decreased coordination, decreased safety awareness and L inattention. Patient previously walking with cane at home with spouse assist following SNF stay.  Currently mod A for ambulation to door with hand hold A with L inattention, flexed and shuffling gait and poor safety awareness with high fall risk.  Feel she may benefit from continued skilled PT in the acute setting and from post-acute inpatient rehab (>3 hours/day) prior to d/c home with spouse assist.         If plan is discharge home, recommend the following: A little help with walking and/or transfers;A little help with bathing/dressing/bathroom;Assist for transportation;Help with stairs or ramp for entrance;Assistance with cooking/housework;Direct supervision/assist for medications management;Direct supervision/assist for financial management   Can travel by private vehicle        Equipment Recommendations None recommended by PT  Recommendations for Other Services  Rehab consult    Functional Status Assessment Patient has had a recent decline in their functional status and demonstrates the ability to make significant improvements in function in a  reasonable and predictable amount of time.     Precautions / Restrictions Precautions Precautions: Fall Recall of Precautions/Restrictions: Impaired Precaution/Restrictions Comments: L inattention      Mobility  Bed Mobility               General bed mobility comments: in recliner    Transfers Overall transfer level: Needs assistance Equipment used: 1 person hand held assist Transfers: Sit to/from Stand Sit to Stand: Min assist           General transfer comment: HHA on R for balance, initiation coming forward to edge of chair and A for lines while on EEG monitor    Ambulation/Gait Ambulation/Gait assistance: Mod assist Gait Distance (Feet): 20 Feet Assistive device: 1 person hand held assist Gait Pattern/deviations: Shuffle, Trunk flexed, Step-to pattern, Decreased stride length       General Gait Details: flexed with short shuffling steps and A for balance throughout  Stairs            Wheelchair Mobility     Tilt Bed    Modified Rankin (Stroke Patients Only)       Balance Overall balance assessment: Needs assistance Sitting-balance support: Feet supported Sitting balance-Leahy Scale: Fair     Standing balance support: Single extremity supported Standing balance-Leahy Scale: Poor Standing balance comment: assist for balance with flexed posture and mild anterior bias                             Pertinent Vitals/Pain Pain Assessment Faces Pain Scale: Hurts little more Pain Location: L knee, R wrist Pain Descriptors / Indicators: Sore Pain Intervention(s): Repositioned, Monitored during session    Home Living Family/patient expects to be discharged to:: Private residence  Living Arrangements: Spouse/significant other Available Help at Discharge: Family;Available 24 hours/day Type of Home: House Home Access: Ramped entrance       Home Layout: One level Home Equipment: Agricultural consultant (2 wheels);Cane - single  point;Wheelchair - manual;Shower seat Additional Comments: pt was at rehab facility ([facility]?), 2 weeks PTA. Husband his hopeful to get pt back home.    Prior Function Prior Level of Function : History of Falls (last six months)             Mobility Comments: Per report of husband, pt was walking ~124ft with therapies at rehab. ADLs Comments: Per chart pt has been having supervision for ADLs at baseline, unsure of level of support at rehab for ADLs     Extremity/Trunk Assessment   Upper Extremity Assessment Upper Extremity Assessment: Defer to OT evaluation RUE Deficits / Details: Ataxia/apraxia.  Slowed movements. ROM at shoulder, elbow and digits is [facility]. Limited ROM at wrist due to pain. R weakness due to hx of CVA RUE Coordination: decreased gross motor;decreased fine motor LUE Deficits / Details: ROM is WFL, globally 4/5. Coordination is slowed and deliberate LUE Coordination: decreased gross motor;decreased fine motor    Lower Extremity Assessment Lower Extremity Assessment: Generalized weakness RLE Coordination: decreased gross motor LLE Deficits / Details: more decreased with coordination on L than R with toe taps LLE Coordination: decreased gross motor    Cervical / Trunk Assessment Cervical / Trunk Assessment: Kyphotic  Communication   Communication Communication: Impaired Factors Affecting Communication: Reduced clarity of speech;Difficulty expressing self    Cognition Arousal: Alert Behavior During Therapy: Flat affect   PT - Cognitive impairments: Sequencing, Problem solving, Attention, Initiation, Awareness, Safety/Judgement                       PT - Cognition Comments: L inattention, did not see the meal tray in front of her when seated in chair; needing multimodal cues for sequencing scooting forward and back on chair Following commands: Impaired Following commands impaired: Follows one step commands with increased time     Cueing Cueing  Techniques: Verbal cues, Gestural cues, Tactile cues     General Comments General comments (skin integrity, edema, etc.): HR 101, SpO2 100% on RA with ambulation; tech in at end of session to remove EEG; pt able to drink from cup with straw though slow and labored, uncovered fruit and pt attempting to eat with fingers with cues for attention; NT in the room to assist    Exercises     Assessment/Plan    PT Assessment Patient needs continued PT services  PT Problem List Decreased strength;Decreased balance;Decreased mobility;Decreased coordination;Decreased knowledge of precautions;Decreased safety awareness;Decreased activity tolerance;Decreased cognition       PT Treatment Interventions DME instruction;Gait training;Functional mobility training;Therapeutic activities;Therapeutic exercise;Balance training;Patient/family education;Cognitive remediation    PT Goals (Current goals can be found in the Care Plan section)  Acute Rehab PT Goals Patient Stated Goal: eager to mobilize PT Goal Formulation: With patient Time For Goal Achievement: 09/19/23 Potential to Achieve Goals: Fair    Frequency Min 2X/week     Co-evaluation               AM-PAC PT 6 Clicks Mobility  Outcome Measure Help needed turning from your back to your side while in a flat bed without using bedrails?: A Little Help needed moving from lying on your back to sitting on the side of a flat bed without using bedrails?: A Little Help  needed moving to and from a bed to a chair (including a wheelchair)?: A Lot Help needed standing up from a chair using your arms (e.g., wheelchair or bedside chair)?: A Lot Help needed to walk in hospital room?: A Lot Help needed climbing 3-5 steps with a railing? : Total 6 Click Score: 13    End of Session Equipment Utilized During Treatment: Gait belt Activity Tolerance: Patient limited by fatigue Patient left: in chair;with call bell/phone within reach;with chair alarm  set;with nursing/sitter in room   PT Visit Diagnosis: Other abnormalities of gait and mobility (R26.89);Muscle weakness (generalized) (M62.81);Other symptoms and signs involving the nervous system (R29.898)    Time: 8850-8792 PT Time Calculation (min) (ACUTE ONLY): 18 min   Charges:   PT Evaluation $PT Eval Moderate Complexity: 1 Mod   PT General Charges $$ ACUTE PT VISIT: 1 Visit         Micheline Portal, PT Acute Rehabilitation Services Office:[phone] 09/05/2023   Montie Portal 09/05/2023, 12:28 PM

## 2023-09-05 NOTE — Procedures (Signed)
 Patient Name: Jacqueline Bonilla  MRN: [id number]  Epilepsy Attending: Arlin MALVA Krebs  Referring Physician/Provider: everitt Clint Abbey Earle FORBES, NP  Duration: 09/05/2023 9144 to 09/05/2023 1206   Patient history:  68yo F presented to the ED today with initial symptoms of right facial weakness and speech disturbance with a right forced gaze, oxygen  desaturation, and seizure activity en route to the ED. EEG to evaluate for seizure   Level of alertness:  awake, asleep   AEDs during EEG study: LEV, VPA   Technical aspects: This EEG study was done with scalp electrodes positioned according to the 10-20 International system of electrode placement. Electrical activity was reviewed with band pass filter of 1-70Hz , sensitivity of 7 uV/mm, display speed of 62mm/sec with a 60Hz  notched filter applied as appropriate. EEG data were recorded continuously and digitally stored.  Video monitoring was available and reviewed as appropriate.   Description:  The posterior dominant rhythm consists of 8 Hz activity of moderate voltage (25-35 uV) seen predominantly in posterior head regions, symmetric and reactive to eye opening and eye closing. Sleep was characterized by vertex waves, sleep spindles (12 to 14 Hz), maximal frontocentral region. EEG showed intermittent 3-5 hz theta- delta slowing in left hemisphere. Hyperventilation and photic stimulation were not performed.      ABNORMALITY - Intermittent slow, left hemisphere   IMPRESSION: This study was suggestive of cortical dysfunction in left hemisphere likely secondary to underlying structural abnormality. No seizures were noted.   Goldye Tourangeau [place]

## 2023-09-05 NOTE — Progress Notes (Signed)
 Inpatient Rehab Admissions Coordinator Note:  Per therapy recommendations patient was screened for CIR candidacy by Reche FORBES Lowers, PT. At this time, pt does not appear to demonstrate medical necessity to support a CIR authorization from [facility].  We will not pursue a rehab consult at this time.  Recommend other rehab venues to be pursued.  Please contact me with any questions.  Candies Palm E Mehtab Dolberry, [place] [phone] 09/05/23  3:35 PM

## 2023-09-05 NOTE — Progress Notes (Signed)
 PROGRESS NOTE    Jacqueline Bonilla  FMW:[id number] DOB: [birth year] DOA: 09/03/2023 PCP: Purcell Emil Schanz, MD   Brief Narrative:  HPI: Jacqueline Bonilla is a 68 y.o. female with history of stroke with right-sided weakness who was recently admitted to the hospital on 08/15/2023 for status epilepticus at that time patient also was intubated due to respiratory failure in the setting of status epilepticus eventually discharged to rehab on 08/26/2023 on Keppra  was brought to the ER after patient was found to have left-sided weakness with difficulty speaking.  Patient prior to this as per the report had a fall in the rehab with some bleeding from her lips at around 1:30 AM yesterday morning   ED Course: In the ER patient was evaluated by neurologist.  CT head was unremarkable.  Per neurology patient had some right gaze deviation which resolved spontaneously.  And patient's aphasia improved.  Neurology feels that patient's fall early in the morning could have been related to her seizures.  Neurology planning to get Keppra  levels and the continuous EEG and admit for further observation.  Labs are largely unremarkable.  EG shows normal sinus rhythm.  At the time of my exam patient is well-oriented to time place and person.  Assessment & Plan:   Principal Problem:   Seizure [facility]) Active Problems:   Essential hypertension   History of CVA (cerebrovascular accident)   Dyslipidemia  Breakthrough seizures: Recent hospitalization for status epilepticus, patient was on Depakote  prior to that admission but was discharged on Keppra  this time.    Neurology is following her.  She was loaded with Keppra  and continued on that.  She underwent LTM EEG for at least 1-1/2-hour and was not found to have any epileptiform activity.  Neurology informed that she was taking her Keppra  and yet she had seizure activity so they added Depakote  as well.  She has been seizure-free since in the hospital.  Neurology has cleared her  to discharge on current dose of Keppra  and Depakote .  Patient is globally weak, does not have any focal deficit.  Generalized weakness: Seen by PT OT, they are recommending CIR.  For any reason if she is not approved for CIR, PT recommends SNF.  History of stroke with residual right-sided weakness/hyperlipidemia: Asymptomatic.  Continue statin and aspirin .  Essential hypertension: Blood pressure controlled and sometimes on the lower side, she is not on any medications for this.  DVT prophylaxis: enoxaparin  (LOVENOX ) injection 40 mg Start: 09/04/23 1000   Code Status: Full Code  Family Communication:  None present at bedside.  Plan of care discussed with patient in length and he/she verbalized understanding and agreed with it.  Status is: Inpatient Remains inpatient appropriate because: Needs placement to CIR, medically stable.     Estimated body mass index is 18.69 kg/m as calculated from the following:   Height as of 08/18/23: 5' 4 (1.626 m).   Weight as of 08/24/23: 49.4 kg.    Nutritional Assessment: There is no height or weight on file to calculate BMI.. Seen by dietician.  I agree with the assessment and plan as outlined below: Nutrition Status:        . Skin Assessment: I have examined the patient's skin and I agree with the wound assessment as performed by the wound care RN as outlined below:    Consultants:  Neurology  Procedures:  None  Antimicrobials:  Anti-infectives (From admission, onward)    None         Subjective: Seen  and examined this morning, sitting in the recliner, eating her breakfast, he still was hooked up to LTM EEG at that time.  She had no complaints.  She was fully alert and oriented although very slow and weak with following commands.  Objective: Vitals:   09/04/23 2029 09/05/23 0100 09/05/23 0806 09/05/23 1208  BP: (!) 142/84 127/79 129/86 96/78  Pulse: 97 92 80 92  Resp: 18  18 18   Temp: 99.4 F (37.4 C) 98.4 F (36.9 C)  98.8 F (37.1 C) 97.6 F (36.4 C)  TempSrc: Oral Oral Oral Oral  SpO2: 99% 96% 97%     Intake/Output Summary (Last 24 hours) at 09/05/2023 1235 Last data filed at 09/05/2023 0400 Gross per 24 hour  Intake 250 ml  Output --  Net 250 ml   There were no vitals filed for this visit.  Examination:  General exam: Appears calm and comfortable  Respiratory system: Clear to auscultation. Respiratory effort normal. Cardiovascular system: S1 & S2 heard, RRR. No JVD, murmurs, rubs, gallops or clicks. No pedal edema. Gastrointestinal system: Abdomen is nondistended, soft and nontender. No organomegaly or masses felt. Normal bowel sounds heard. Central nervous system: Alert and oriented. No focal neurological deficits.  Global weakness. Extremities: Symmetric 5 x 5 power. Skin: No rashes, lesions or ulcers.    Data Reviewed: I have personally reviewed following labs and imaging studies  CBC: Recent Labs  Lab 09/03/23 1611 09/03/23 1625 09/04/23 0523  WBC  --  7.5 5.9  NEUTROABS  --  5.0 3.6  HGB 12.9 12.6 11.5*  HCT 38.0 39.6 36.4  MCV  --  93.2 93.1  PLT  --  301 251   Basic Metabolic Panel: Recent Labs  Lab 09/03/23 1611 09/03/23 1625 09/04/23 0523  NA 140 137 137  K 4.5 4.8 4.0  CL 103 103 105  CO2  --  22 23  GLUCOSE 110* 102* 101*  BUN 29* 25* 19  CREATININE 0.90 0.78 0.71  CALCIUM   --  9.5 9.1  MG  --   --  1.8   GFR: Estimated Creatinine Clearance: 51 mL/min (by C-G formula based on SCr of 0.71 mg/dL). Liver Function Tests: Recent Labs  Lab 09/03/23 1625  AST 31  ALT 22  ALKPHOS 47  BILITOT 0.9  PROT 7.9  ALBUMIN 3.6   No results for input(s): LIPASE, AMYLASE in the last 168 hours. No results for input(s): AMMONIA in the last 168 hours. Coagulation Profile: Recent Labs  Lab 09/03/23 1625  INR 1.1   Cardiac Enzymes: No results for input(s): CKTOTAL, CKMB, CKMBINDEX, TROPONINI in the last 168 hours. BNP (last 3 results) No results for  input(s): PROBNP in the last 8760 hours. HbA1C: No results for input(s): HGBA1C in the last 72 hours. CBG: Recent Labs  Lab 09/03/23 1601  GLUCAP 104*   Lipid Profile: No results for input(s): CHOL, HDL, LDLCALC, TRIG, CHOLHDL, LDLDIRECT in the last 72 hours. Thyroid  Function Tests: No results for input(s): TSH, T4TOTAL, FREET4, T3FREE, THYROIDAB in the last 72 hours. Anemia Panel: No results for input(s): VITAMINB12, FOLATE, FERRITIN, TIBC, IRON, RETICCTPCT in the last 72 hours. Sepsis Labs: No results for input(s): PROCALCITON, LATICACIDVEN in the last 168 hours.  No results found for this or any previous visit (from the past 240 hours).   Radiology Studies: Overnight EEG with video Result Date: 09/05/2023 [name], MD     09/05/2023  9:59 AM Patient Name: TRANIECE BOFFA MRN: [id number] Epilepsy Attending:  Arlin MALVA Krebs Referring Physician/Provider: everitt Clint Abbey Earle FORBES, NP Duration: 09/04/2023 9144 to 09/05/2023 9144  Patient history:  68yo F presented to the ED today with initial symptoms of right facial weakness and speech disturbance with a right forced gaze, oxygen  desaturation, and seizure activity en route to the ED. EEG to evaluate for seizure  Level of alertness:  awake, asleep AEDs during EEG study: LEV, VPA  Technical aspects: This EEG study was done with scalp electrodes positioned according to the 10-20 International system of electrode placement. Electrical activity was reviewed with band pass filter of 1-70Hz , sensitivity of 7 uV/mm, display speed of 61mm/sec with a 60Hz  notched filter applied as appropriate. EEG data were recorded continuously and digitally stored.  Video monitoring was available and reviewed as appropriate.  Description:  The posterior dominant rhythm consists of 8 Hz activity of moderate voltage (25-35 uV) seen predominantly in posterior head regions, symmetric and reactive to eye opening and eye closing.  Sleep was characterized by vertex waves, sleep spindles (12 to 14 Hz), maximal frontocentral region. EEG showed intermittent 3-5 hz theta- delta slowing in left hemisphere. Hyperventilation and photic stimulation were not performed.    ABNORMALITY - Intermittent slow, left hemisphere  IMPRESSION: This study was suggestive of cortical dysfunction in left hemisphere likely secondary to underlying structural abnormality. No seizures were noted.  Arlin MALVA Krebs   DG Pelvis 1-2 Views Result Date: 09/04/2023 EXAM: 1 or 2 VIEW(S) XRAY OF THE PELVIS 09/04/2023 06:38:00 AM COMPARISON: 08/15/2023 CLINICAL HISTORY: [id number] Fall [id number]. Reason for exam: fall FINDINGS: BONES AND JOINTS: No acute fracture. No focal osseous lesion. No joint dislocation. SOFT TISSUES: The soft tissues are unremarkable. IMPRESSION: 1. No evidence of acute traumatic injury. Electronically signed by: Evalene Coho MD 09/04/2023 06:45 AM EDT RP Workstation: [id number]   CT HEAD CODE STROKE WO CONTRAST Result Date: 09/03/2023 CLINICAL DATA:  Code stroke. Neuro deficit, acute, stroke suspected. Right-sided gaze. Right arm weakness. History of stroke. EXAM: CT HEAD WITHOUT CONTRAST TECHNIQUE: Contiguous axial images were obtained from the base of the skull through the vertex without intravenous contrast. RADIATION DOSE REDUCTION: This exam was performed according to the departmental dose-optimization program which includes automated exposure control, adjustment of the mA and/or kV according to patient size and/or use of iterative reconstruction technique. COMPARISON:  Brain MRI 08/16/2023. FINDINGS: Brain: Cerebral atrophy. Prominence of the ventricles and sulci, which appears commensurate. Patchy chronic cortical/subcortical infarcts within the bilateral frontal, parietal and occipital lobes, not appreciably changed from the prior brain MRI of 08/16/2023. Chronic lacunar infarcts within the bilateral deep gray nuclei, and small chronic infarcts  within the bilateral cerebellar hemispheres, also not appreciably changed. Background moderate patchy and ill-defined hypoattenuation within the cerebral white matter, nonspecific but compatible with chronic small vessel ischemic disease. There is no acute intracranial hemorrhage. No acute demarcated cortical infarct. No extra-axial fluid collection. No evidence of an intracranial mass. No midline shift. Vascular: No hyperdense vessel.  Atherosclerotic calcifications. Skull: No calvarial fracture or aggressive osseous lesion. Sinuses/Orbits: No mass or acute finding within the imaged orbits. No significant paranasal sinus disease at the imaged levels. ASPECTS [facility] Stroke Program Early CT Score) - Ganglionic level infarction (caudate, lentiform nuclei, internal capsule, insula, M1-M3 cortex): 7 - Supraganglionic infarction (M4-M6 cortex): 3 Total score (0-10 with 10 being normal): 10 No evidence of an acute intracranial abnormality. These results were communicated to Dr. Sallyann at 4:25 pmon 9/2/2025by text page via the [facility] messaging system. IMPRESSION: 1. No evidence  of an acute intracranial abnormality. 2. Parenchymal atrophy, chronic small vessel ischemic disease and unchanged chronic infarcts, as described. Electronically Signed   By: Rockey Childs D.O.   On: 09/03/2023 16:25    Scheduled Meds:  aspirin   81 mg Oral Daily   atorvastatin   40 mg Oral Daily   divalproex   250 mg Oral Q12H   enoxaparin  (LOVENOX ) injection  40 mg Subcutaneous Q24H   levETIRAcetam   1,000 mg Oral BID   sodium chloride  flush  3 mL Intravenous Once   Continuous Infusions:   LOS: 1 day   Fredia Skeeter, MD [facility]  09/05/2023, 12:35 PM  Total time spent 40 minutes *Please note that this is a verbal dictation therefore any spelling or grammatical errors are due to the Dragon Medical One system interpretation.  Please page via Amion and do not message via secure chat for urgent patient care matters. Secure chat  can be used for non urgent patient care matters.  How to contact the [facility] Attending or Consulting provider 7A - 7P or covering provider during after hours 7P -7A, for this patient?  Check the care team in [facility] and look for a) attending/consulting [facility] provider listed and b) the [facility] team listed. Page or secure chat 7A-7P. Log into [email or web address] and use [facility] universal password to access. If you do not have the password, please contact the hospital operator. Locate the [facility] provider you are looking for under [facility] and page to a number that you can be directly reached. If you still have difficulty reaching the provider, please page the [facility] (Director on Call) for the Hospitalists listed on amion for assistance.

## 2023-09-05 NOTE — Progress Notes (Signed)
 LTM maint complete - no skin breakdown under: ALL leads, Impedance under 10kohms, computer restarted, atrium monitoring again and wrapped pt head

## 2023-09-05 NOTE — Procedures (Signed)
 Patient Name: Jacqueline Bonilla  MRN: [id number]  Epilepsy Attending: Arlin MALVA Krebs  Referring Physician/Provider: everitt Clint Abbey Earle FORBES, NP  Duration: 09/04/2023 9144 to 09/05/2023 9144   Patient history:  68yo F presented to the ED today with initial symptoms of right facial weakness and speech disturbance with a right forced gaze, oxygen  desaturation, and seizure activity en route to the ED. EEG to evaluate for seizure   Level of alertness:  awake, asleep  AEDs during EEG study: LEV, VPA   Technical aspects: This EEG study was done with scalp electrodes positioned according to the 10-20 International system of electrode placement. Electrical activity was reviewed with band pass filter of 1-70Hz , sensitivity of 7 uV/mm, display speed of 27mm/sec with a 60Hz  notched filter applied as appropriate. EEG data were recorded continuously and digitally stored.  Video monitoring was available and reviewed as appropriate.   Description:  The posterior dominant rhythm consists of 8 Hz activity of moderate voltage (25-35 uV) seen predominantly in posterior head regions, symmetric and reactive to eye opening and eye closing. Sleep was characterized by vertex waves, sleep spindles (12 to 14 Hz), maximal frontocentral region. EEG showed intermittent 3-5 hz theta- delta slowing in left hemisphere. Hyperventilation and photic stimulation were not performed.      ABNORMALITY - Intermittent slow, left hemisphere   IMPRESSION: This study was suggestive of cortical dysfunction in left hemisphere likely secondary to underlying structural abnormality. No seizures were noted.   Elvyn Krohn O Maytte Jacot

## 2023-09-05 NOTE — Progress Notes (Signed)
 NEUROLOGY CONSULT FOLLOW UP NOTE   Date of service: September 05, 2023 Patient Name: Jacqueline Bonilla MRN:  [id number] DOB:  1955-07-27  Interval Hx/subjective   Gaze is midline this morning. Patient is also alert and oriented x 4 with some intermittent confiusion  Vitals   Vitals:   09/04/23 1806 09/04/23 2029 09/05/23 0100 09/05/23 0806  BP: 138/86 (!) 142/84 127/79 129/86  Pulse: 84 97 92 80  Resp: 14 18  18   Temp: 99.4 F (37.4 C) 99.4 F (37.4 C) 98.4 F (36.9 C) 98.8 F (37.1 C)  TempSrc: Oral Oral Oral Oral  SpO2: 100% 99% 96% 97%     There is no height or weight on file to calculate BMI.  Physical Exam   Constitutional: Appears well-developed and well-nourished.  Psych: Affect appropriate to situation.  Eyes: No scleral injection.  HENT: No OP obstruction.  Head: Normocephalic.  Cardiovascular: Normal rate and regular rhythm.  Respiratory: Effort normal, non-labored breathing.  Skin: WDI.    Neurologic Examination      NEURO:  Mental Status: Patient is alert and oriented to person (incorrect age), states month as August and then self corrects.  Speech/Language: speech is without dysarthria or aphasia.    Cranial Nerves:  II: PERRL.  Left hemianopsia III, IV, VI: Eyes midline, EOM intact  V: Sensation is intact to light touch and symmetrical to face.  VII: Left facial droop VIII: hearing intact to voice. IX, X:  Phonation is normal.  XII: tongue is midline without fasciculations. Motor: Drift noted in bilateral upper and lower extremities, right greater than left Tone: is slightly increased and bulk is normal Sensation- Intact to light touch bilaterally. Extinction absent to light touch to DSS.  Coordination: FTN intact bilaterally Gait- deferred  Medications  Current Facility-Administered Medications:    acetaminophen  (TYLENOL ) tablet 650 mg, 650 mg, Oral, Q6H PRN, 650 mg at 09/04/23 2016 **OR** acetaminophen  (TYLENOL ) suppository 650 mg, 650 mg,  Rectal, Q6H PRN, Franky Redia SAILOR, MD   aspirin  chewable tablet 81 mg, 81 mg, Oral, Daily, Franky Redia SAILOR, MD, 81 mg at 09/05/23 9046   atorvastatin  (LIPITOR) tablet 40 mg, 40 mg, Oral, Daily, Franky Redia SAILOR, MD, 40 mg at 09/05/23 9046   divalproex  (DEPAKOTE ) DR tablet 250 mg, 250 mg, Oral, Q12H, Bui, Xem M, MD, 250 mg at 09/05/23 0953   enoxaparin  (LOVENOX ) injection 40 mg, 40 mg, Subcutaneous, Q24H, Franky Redia SAILOR, MD, 40 mg at 09/05/23 9045   levETIRAcetam  (KEPPRA ) tablet 1,000 mg, 1,000 mg, Oral, BID, Pahwani, Ravi, MD, 1,000 mg at 09/05/23 0953   sodium chloride  flush (NS) 0.9 % injection 3 mL, 3 mL, Intravenous, Once, Armenta Canning, MD  Labs and Diagnostic Imaging   CBC:  Recent Labs  Lab 09/03/23 1625 09/04/23 0523  WBC 7.5 5.9  NEUTROABS 5.0 3.6  HGB 12.6 11.5*  HCT 39.6 36.4  MCV 93.2 93.1  PLT 301 251    Basic Metabolic Panel:  Lab Results  Component Value Date   NA 137 09/04/2023   K 4.0 09/04/2023   CO2 23 09/04/2023   GLUCOSE 101 (H) 09/04/2023   BUN 19 09/04/2023   CREATININE 0.71 09/04/2023   CALCIUM  9.1 09/04/2023   GFRNONAA >60 09/04/2023   GFRAA 78 07/20/2019   Lipid Panel:  Lab Results  Component Value Date   LDLCALC 99 07/01/2023   HgbA1c:  Lab Results  Component Value Date   HGBA1C 5.5 07/01/2023   Urine Drug Screen:  Component Value Date/Time   LABOPIA NONE DETECTED 08/15/2023 1024   COCAINSCRNUR NONE DETECTED 08/15/2023 1024   LABBENZ POSITIVE (A) 08/15/2023 1024   AMPHETMU NONE DETECTED 08/15/2023 1024   THCU NONE DETECTED 08/15/2023 1024   LABBARB NONE DETECTED 08/15/2023 1024    Alcohol Level     Component Value Date/Time   ETH <15 09/03/2023 1626   INR  Lab Results  Component Value Date   INR 1.1 09/03/2023   APTT  Lab Results  Component Value Date   APTT 38 (H) 09/03/2023   AED levels: No results found for: PHENYTOIN, ZONISAMIDE, LAMOTRIGINE, LEVETIRACETA  Keppra  Level Pending    CT Head without contrast(Personally reviewed): No acute abnormality, atrophy and chronic small vessel ischemic disease  EEG: 09/04/2023 0855 to 09/05/2023 0855  This study was suggestive of cortical dysfunction in left hemisphere likely secondary to underlying structural abnormality. No seizures were noted.   Assessment   Jacqueline Bonilla is a 68 y.o. 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  She had a recent admission last month for status epilepticus.  Patient resides in a rehabilitation facility and per husband has been working on her gait and has baseline left hemianopsia and right hemiparesis.  She became aphasic with increased left-sided weakness and brought her to the hospital.  Upon arrival, she was noted to have a right gaze deviation, notable aphasia and left hemianopsia.  CT head was negative for acute abnormality, and previous seizures did consist of right gaze deviation and right upper extremity twitching.  Right gaze deviation did resolve spontaneously while patient was in the CT scanner.  Suspect that patient's fall may have also been due to an unwitnessed seizure. As she has been compliant with Keppra  and due to her renal function, will not increase any further & will start Depakote  today as she and husband deny any known side effects from it.  Discontinue LTM today.   Recommendations  - Discontinue LTM - Keppra  level pending - Continue Keppra  1000 mg twice daily - Depakote  250mg  BID - Seizure precautions ______________________________________________________________________   Bonney Jorene Last, NP [facility]

## 2023-09-05 NOTE — Progress Notes (Signed)
 LTM maint complete - no skin breakdown under: FP1,GRNd,P4

## 2023-09-05 NOTE — Progress Notes (Signed)
 LTM VIDEO EEG discontinued - no skin breakdown at [facility].

## 2023-09-05 NOTE — Evaluation (Signed)
 Occupational Therapy Evaluation Patient Details Name: Jacqueline Bonilla MRN: [id number] DOB: 04/08/1955 Today's Date: 09/05/2023   History of Present Illness   Jacqueline Bonilla is a 68 y.o. female who presented with right-sided facial droop and slurred speech. When she arrived in the ED, she began seizing. CT head was negative for any acute processes.  Multiple remote infarcts as well as chronic small vessel disease with parenchymal volume loss was noted. LTM EEG pending. PMH significant of 2 left MCA territory infarcts participating in the [place] trial, loop recorder placement, vascular dementia, sleep apnea, HLD, thrombocytopenia, osteoporosis, HTN, depression, anxiety and anemia     Clinical Impressions Jacqueline Bonilla was evaluated s/p the above admission list. She has been in rehab for ~2 weeks PTA, per her husband she was walking ~139ft with therapies at rehab. At her functional baseline, she was superivsion A for ADLs and mobility and living at home with her husband. Upon evaluation the pt was limited by LTM EEG, impaired cognition, complaint of R wrist pain, unsteady balance, short shuffling gait, generalized weakness with new L weakness and slowed/deliberate coordination bilaterally. Overall she needed min A for bed mobility, STS and to take pivotal stepping from the bed>chair with HHA. Due to the deficits listed below the pt also needs max A for LB ADLs and min-mod A for UB ADLs. Pt will benefit from continued acute OT services and intensive inpatient follow up therapy, >3 hours/day after discharge.   Spoke with pt's husband over the phone to confirm PLOF/home details and follow up therapy plan. Pt's husband appreciative and eager for pt to participate with therapies with hope to eventually come back home.       If plan is discharge home, recommend the following:   Assist for transportation;Assistance with cooking/housework;A lot of help with walking and/or transfers;A lot of help with  bathing/dressing/bathroom;Direct supervision/assist for medications management     Functional Status Assessment   Patient has had a recent decline in their functional status and demonstrates the ability to make significant improvements in function in a reasonable and predictable amount of time.     Equipment Recommendations   None recommended by OT     Recommendations for Other Services   Rehab consult     Precautions/Restrictions   Precautions Precautions: Fall Recall of Precautions/Restrictions: Impaired Restrictions Weight Bearing Restrictions Per Provider Order: No     Mobility Bed Mobility Overal bed mobility: Needs Assistance Bed Mobility: Supine to Sit     Supine to sit: Mod assist          Transfers Overall transfer level: Needs assistance Equipment used: 1 person hand held assist Transfers: Sit to/from Stand, Bed to chair/wheelchair/BSC Sit to Stand: Min assist     Step pivot transfers: Min assist     General transfer comment: HHA due to R wrist pain      Balance Overall balance assessment: Needs assistance Sitting-balance support: Feet supported Sitting balance-Leahy Scale: Fair Sitting balance - Comments: Initially had posterior bias, able to self correct to supervision A for sitting balance   Standing balance support: Single extremity supported, During functional activity Standing balance-Leahy Scale: Poor             ADL either performed or assessed with clinical judgement   ADL Overall ADL's : Needs assistance/impaired Eating/Feeding: Set up;Sitting   Grooming: Minimal assistance;Sitting   Upper Body Bathing: Moderate assistance;Sitting   Lower Body Bathing: Maximal assistance;Sit to/from stand   Upper Body Dressing : Minimal assistance;Sitting  Lower Body Dressing: Maximal assistance;Sit to/from stand   Toilet Transfer: Minimal Cabin crew Details (indicate cue type and reason):  HHA Toileting- Clothing Manipulation and Hygiene: Maximal assistance;Sit to/from stand       Functional mobility during ADLs: Minimal assistance General ADL Comments: HHA for mobility due to R wrist pain, pt with initial posterior bias but was able to correct. Pt wtih short shuffling steps from bed>chair. Required step by step cues for all tasks.     Vision Baseline Vision/History: 0 No visual deficits Vision Assessment?: Vision impaired- to be further tested in functional context Additional Comments: difficult to assess due to cognition, no overt visual deficit noted.     Perception Perception: Not tested       Praxis Praxis: Not tested       Pertinent Vitals/Pain Pain Assessment Pain Assessment: Faces Faces Pain Scale: Hurts even more Pain Location: R wrist Pain Descriptors / Indicators: Grimacing, Guarding Pain Intervention(s): Monitored during session, Limited activity within patient's tolerance     Extremity/Trunk Assessment Upper Extremity Assessment Upper Extremity Assessment: RUE deficits/detail;LUE deficits/detail RUE Deficits / Details: Ataxia/apraxia.  Slowed movements. ROM at shoulder, elbow and digits is [facility]. Limited ROM at wrist due to pain. R weakness due to hx of CVA RUE Coordination: decreased gross motor;decreased fine motor LUE Deficits / Details: ROM is WFL, globally 4/5. Coordination is slowed and deliberate LUE Coordination: decreased gross motor;decreased fine motor   Lower Extremity Assessment Lower Extremity Assessment: Defer to PT evaluation   Cervical / Trunk Assessment Cervical / Trunk Assessment: Kyphotic   Communication Communication Communication: Impaired Factors Affecting Communication: Reduced clarity of speech;Difficulty expressing self   Cognition Arousal: Alert Behavior During Therapy: Flat affect Cognition: History of cognitive impairments             OT - Cognition Comments: pt was fully oriented but had difficulty  recalling PLOF. she followed all simple 1 step commands, unable to follow through with multistep commands                 Following commands: Impaired       Cueing  General Comments   Cueing Techniques: Verbal cues;Tactile cues  VSS, LTM EEG           Home Living Family/patient expects to be discharged to:: Private residence Living Arrangements: Spouse/significant other Available Help at Discharge: Family;Available 24 hours/day Type of Home: House Home Access: Ramped entrance     Home Layout: One level     Bathroom Shower/Tub: Producer, television/film/video: Handicapped height Bathroom Accessibility: Yes How Accessible: Accessible via walker Home Equipment: Rolling Walker (2 wheels);Cane - single point;Wheelchair - manual;Shower seat   Additional Comments: pt was at rehab facility ([facility]?), 2 weeks PTA. Husband his hopeful to get pt back home.      Prior Functioning/Environment Prior Level of Function : History of Falls (last six months)             Mobility Comments: Per report of husband, pt was walking ~193ft with therapies at rehab. ADLs Comments: Per chart pt has been having supervision for ADLs at baseline, unsure of level of support at rehab for ADLs    OT Problem List: Decreased strength;Impaired balance (sitting and/or standing);Decreased coordination;Impaired tone;Decreased safety awareness   OT Treatment/Interventions: Self-care/ADL training;Therapeutic activities;Cognitive remediation/compensation;Patient/family education;DME and/or AE instruction;Balance training      OT Goals(Current goals can be found in the care plan section)   Acute Rehab OT Goals Patient Stated Goal:  per husband, for pt to come home OT Goal Formulation: With patient Time For Goal Achievement: 09/02/23 Potential to Achieve Goals: Good ADL Goals Pt Will Perform Grooming: with modified independence Pt Will Perform Upper Body Dressing: with modified  independence Pt Will Perform Lower Body Dressing: with supervision Pt Will Transfer to Toilet: with contact guard assist;stand pivot transfer;bedside commode Additional ADL Goal #1: Pt will sequence thruogh ADL task with supervision A only to demonstrate improved cognition   OT Frequency:  Min 2X/week    Co-evaluation              AM-PAC OT 6 Clicks Daily Activity     Outcome Measure Help from another person eating meals?: A Little Help from another person taking care of personal grooming?: A Little Help from another person toileting, which includes using toliet, bedpan, or urinal?: A Lot Help from another person bathing (including washing, rinsing, drying)?: A Lot Help from another person to put on and taking off regular upper body clothing?: A Little Help from another person to put on and taking off regular lower body clothing?: A Lot 6 Click Score: 15   End of Session Equipment Utilized During Treatment: Gait belt Nurse Communication: Mobility status  Activity Tolerance: Patient tolerated treatment well Patient left: in chair;with call bell/phone within reach;with chair alarm set  OT Visit Diagnosis: Unsteadiness on feet (R26.81);Other abnormalities of gait and mobility (R26.89);Muscle weakness (generalized) (M62.81);History of falling (Z91.81);Feeding difficulties (R63.3);Hemiplegia and hemiparesis;Other symptoms and signs involving the nervous system (R29.898);Ataxia, unspecified (R27.0) Hemiplegia - Right/Left: Left                Time: 8996-8972 OT Time Calculation (min): 24 min Charges:  OT General Charges $OT Visit: 1 Visit OT Evaluation $OT Eval Moderate Complexity: 1 Mod OT Treatments $Therapeutic Activity: 8-22 mins  Lucie Kendall, OTR/L Acute Rehabilitation Services Office [phone] Secure Chat Communication Preferred,   Lucie JONETTA Kendall 09/05/2023, 10:52 AM

## 2023-09-05 NOTE — Plan of Care (Signed)
  Problem: Clinical Measurements: Goal: Ability to maintain clinical measurements within normal limits will improve Outcome: Progressing Goal: Will remain free from infection Outcome: Progressing   Problem: Elimination: Goal: Will not experience complications related to urinary retention Outcome: Progressing   Problem: Education: Goal: Knowledge of General Education information will improve Description: Including pain rating scale, medication(s)/side effects and non-pharmacologic comfort measures Outcome: Not Progressing   Problem: Health Behavior/Discharge Planning: Goal: Ability to manage health-related needs will improve Outcome: Not Progressing   Problem: Coping: Goal: Level of anxiety will decrease Outcome: Not Progressing

## 2023-09-06 DIAGNOSIS — R569 Unspecified convulsions: Secondary | ICD-10-CM | POA: Diagnosis not present

## 2023-09-06 NOTE — Progress Notes (Signed)
 Occupational Therapy Treatment Patient Details Name: Jacqueline Bonilla MRN: [id number] DOB: Dec 22, 1955 Today's Date: 09/06/2023   History of present illness Jacqueline Bonilla is a 68 y.o. female who presented with right-sided facial droop and slurred speech. When she arrived in the ED, she began seizing. CT head was negative for any acute processes.  Multiple remote infarcts as well as chronic small vessel disease with parenchymal volume loss was noted. LTM EEG pending. PMH significant of 2 left MCA territory infarcts participating in the [place] trial, loop recorder placement, vascular dementia, sleep apnea, HLD, thrombocytopenia, osteoporosis, HTN, depression, anxiety and anemia   OT comments  Patient received in supine with breakfast and difficulty feeding self. Patient agreeable to get up in chair for breakfast. Patient able to ambulate to bathroom with min HHA and max assist for toilet hygiene while standing. Patient stood at sink for hand and face hygiene with cues for attention to left and to wash left hand. Patient was provided setup for self feeding but demonstrated difficulty holding onto utensil.  Red foam added to address with better grip but difficulty loading utensils and required assistance but able to bring to mouth with cues to stay on tasks.  Nursing made of aware of needs with feeding.  Patient will benefit from intensive inpatient follow-up therapy, >3 hours/day.  Acute OT to continue to follow to address established goals to facilitate DC to next venue of care.        If plan is discharge home, recommend the following:  Assist for transportation;Assistance with cooking/housework;A lot of help with walking and/or transfers;A lot of help with bathing/dressing/bathroom;Direct supervision/assist for medications management   Equipment Recommendations  None recommended by OT    Recommendations for Other Services      Precautions / Restrictions Precautions Precautions:  Fall Recall of Precautions/Restrictions: Impaired Precaution/Restrictions Comments: L inattention Restrictions Weight Bearing Restrictions Per Provider Order: No       Mobility Bed Mobility Overal bed mobility: Needs Assistance Bed Mobility: Supine to Sit     Supine to sit: Mod assist, Used rails     General bed mobility comments: assistance with BLEs and trunk with cues for rail use    Transfers Overall transfer level: Needs assistance Equipment used: 1 person hand held assist Transfers: Sit to/from Stand Sit to Stand: Min assist           General transfer comment: min assist for stability during mobility and transfers     Balance Overall balance assessment: Needs assistance Sitting-balance support: Feet supported Sitting balance-Leahy Scale: Fair Sitting balance - Comments: CGA initially once on EOB but progressed to supervision Postural control: Right lateral lean Standing balance support: Single extremity supported Standing balance-Leahy Scale: Poor Standing balance comment: reliant on therapist for balance with one episode of LOB                           ADL either performed or assessed with clinical judgement   ADL Overall ADL's : Needs assistance/impaired Eating/Feeding: Moderate assistance;Cueing for sequencing;With adaptive utensils;Sitting Eating/Feeding Details (indicate cue type and reason): patient with difficulty grasping utensils and red foam added to address. Patietn with difficulty loading utensils but able to bring to mouth.  frequent cues to stay on task Grooming: Wash/dry hands;Wash/dry face;Minimal assistance;Standing;Cueing for sequencing Grooming Details (indicate cue type and reason): stood at sink for grooming and CGA for balance and cues to wash left hand  Toilet Transfer: Minimal assistance;Ambulation;Regular Toilet;Grab bars Toilet Transfer Details (indicate cue type and reason): HHA to ambulate to  bathroom with cues to use grab bars Toileting- Clothing Manipulation and Hygiene: Maximal assistance;Sit to/from stand Toileting - Clothing Manipulation Details (indicate cue type and reason): required assistance for toilet hygiene       General ADL Comments: cues for sequencing and attention to left during self care tasks    Extremity/Trunk Assessment              Vision       Perception     Praxis     Communication Communication Communication: Impaired Factors Affecting Communication: Reduced clarity of speech;Difficulty expressing self   Cognition Arousal: Alert Behavior During Therapy: Flat affect Cognition: History of cognitive impairments             OT - Cognition Comments: oriented to self and place but not time or situation. increased time to follow commands and cues for sequencing                 Following commands: Impaired Following commands impaired: Follows one step commands with increased time      Cueing   Cueing Techniques: Verbal cues, Gestural cues, Tactile cues  Exercises      Shoulder Instructions       General Comments frequent cues for sequencing and left attention    Pertinent Vitals/ Pain       Pain Assessment Pain Assessment: No/denies pain Pain Intervention(s): Monitored during session  Home Living                                          Prior Functioning/Environment              Frequency  Min 2X/week        Progress Toward Goals  OT Goals(current goals can now be found in the care plan section)  Progress towards OT goals: Progressing toward goals  Acute Rehab OT Goals Patient Stated Goal: to go home OT Goal Formulation: With patient Time For Goal Achievement: 09/19/23 Potential to Achieve Goals: Good ADL Goals Pt Will Perform Grooming: with modified independence Pt Will Perform Upper Body Dressing: with modified independence Pt Will Perform Lower Body Dressing: with  supervision Pt Will Transfer to Toilet: with contact guard assist;stand pivot transfer;bedside commode Additional ADL Goal #1: Pt will sequence thruogh ADL task with supervision A only to demonstrate improved cognition  Plan      Co-evaluation                 AM-PAC OT 6 Clicks Daily Activity     Outcome Measure   Help from another person eating meals?: A Little Help from another person taking care of personal grooming?: A Little Help from another person toileting, which includes using toliet, bedpan, or urinal?: A Lot Help from another person bathing (including washing, rinsing, drying)?: A Lot Help from another person to put on and taking off regular upper body clothing?: A Little Help from another person to put on and taking off regular lower body clothing?: A Lot 6 Click Score: 15    End of Session Equipment Utilized During Treatment: Gait belt  OT Visit Diagnosis: Unsteadiness on feet (R26.81);Other abnormalities of gait and mobility (R26.89);Muscle weakness (generalized) (M62.81);History of falling (Z91.81);Feeding difficulties (R63.3);Hemiplegia and hemiparesis;Other symptoms and signs involving the nervous system (R29.898);Ataxia, unspecified (  R27.0) Hemiplegia - Right/Left: Left Hemiplegia - dominant/non-dominant: Dominant Hemiplegia - caused by: Cerebral infarction   Activity Tolerance Patient tolerated treatment well   Patient Left in chair;with call bell/phone within reach;with chair alarm set   Nurse Communication Mobility status;Other (comment) (assistance needed with feeding)        Time: 9154-9076 OT Time Calculation (min): 38 min  Charges: OT General Charges $OT Visit: 1 Visit OT Treatments $Self Care/Home Management : 38-52 mins  Dick Laine, OTA Acute Rehabilitation Services  Office [phone]   Jeb LITTIE Laine 09/06/2023, 12:38 PM

## 2023-09-06 NOTE — Plan of Care (Signed)

## 2023-09-06 NOTE — TOC Initial Note (Signed)
 Transition of Care [facility]) - Initial/Assessment Note    Patient Details  Name: Jacqueline Bonilla MRN: [id number] Date of Birth: Jan 08, 1955  Transition of Care [facility]) CM/SW Contact:    Almarie [name], LCSW Phone Number: 09/06/2023, 3:40 PM  Clinical Narrative:     CSW spoke with patient's spouse, [name], to discuss disposition. Patient has been at [facility], but [place] would like the patient to stay at [facility], if possible. CSW explained patient was not a candidate, and [name] asked about private pay. CSW confirmed with Rehab Admissions that patient could not private pay if she's not an appropriate candidate, they still cannot accept her. CSW updated [name], he is in agreement with SNF but wants to look at other options. CSW faxed out referral, will update with bed offers.           Expected Discharge Plan: Skilled Nursing Facility Barriers to Discharge: Continued Medical Work up, English as a second language teacher   Patient Goals and CMS Choice Patient states their goals for this hospitalization and ongoing recovery are:: get rehab and get back home CMS Medicare.gov Compare Post Acute Care list provided to:: Patient Represenative (must comment) Choice offered to / list presented to : Spouse [place] ownership interest in [facility].provided to:: Spouse    Expected Discharge Plan and Services     Post Acute Care Choice: Skilled Nursing Facility Living arrangements for the past 2 months: Skilled Nursing Facility, Single Family Home                                      Prior Living Arrangements/Services Living arrangements for the past 2 months: Skilled Nursing Facility, Single Family Home Lives with:: Spouse Patient language and need for interpreter reviewed:: No Do you feel safe going back to the place where you live?: Yes      Need for Family Participation in Patient Care: Yes (Comment) Care giver support system in place?: Yes (comment)   Criminal Activity/Legal  Involvement Pertinent to Current Situation/Hospitalization: No - Comment as needed  Activities of Daily Living      Permission Sought/Granted Permission sought to share information with : Facility Medical sales representative, Family Supports Permission granted to share information with : Yes, Verbal Permission Granted  Share Information with NAME: [name]  Permission granted to share info w AGENCY: SNF  Permission granted to share info w Relationship: Spouse     Emotional Assessment Appearance:: Appears stated age Attitude/Demeanor/Rapport: Unable to Assess Affect (typically observed): Unable to Assess Orientation: : Oriented to Self, Oriented to Place, Oriented to Situation Alcohol / Substance Use: Not Applicable Psych Involvement: No (comment)  Admission diagnosis:  Aphasia [R47.01] Seizure (HCC) [R56.9] Left-sided weakness [R53.1] Patient Active Problem List   Diagnosis Date Noted   Pressure injury of skin 08/19/2023   Fever 08/19/2023   Status epilepticus (HCC) 08/19/2023   Malnutrition of moderate degree 08/16/2023   Seizure (HCC) 08/15/2023   Injury of left ankle 07/25/2023   Gait disturbance, 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   Hemiparesis affecting right side as late effect of cerebrovascular accident (CVA) (HCC) 04/23/2023   Apraxia, 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   Frequent falls 02/11/2023   Statin intolerance 04/24/2022   Internal hemorrhoids 01/10/2021   Dyslipidemia 12/29/2020   Seizure-like activity (HCC) 08/30/2020   History of CVA (cerebrovascular accident) 07/14/2020   Normocytic anemia 07/11/2020   Vascular dementia without behavioral disturbance (HCC) 07/15/2019   History of COVID-19 04/22/2019  Essential hypertension 03/03/2019   PCP:  Purcell Emil Schanz, MD Pharmacy:   [facility] [address] [name] - [address] AT [facility] [address] [name] [address] Phone: [phone] Fax: [phone]  [facility] [address] [name] [address] Phone: [phone] Fax: [phone]  [facility] [phone] [place], [name] - [address] AT [facility] OF [address] [name] [address] Phone: [phone] Fax: [phone]  [facility] [address] [name] [address] Phone: [phone] Fax: [phone]     Social Drivers of Health (SDOH) Social History: SDOH Screenings   Food Insecurity: No Food Insecurity (09/05/2023)  Housing: Low Risk  (09/05/2023)  Transportation Needs: No Transportation Needs (09/05/2023)  Utilities: At Risk (09/05/2023)  Alcohol Screen: Low Risk  (10/23/2022)  Depression (PHQ2-9): Low Risk  (07/24/2023)  Financial Resource Strain: Low Risk  (10/23/2022)  Physical Activity: Sufficiently Active (10/23/2022)  Social Connections: Moderately Isolated (09/05/2023)  Stress: No Stress Concern Present (08/28/2021)  Tobacco Use: Low Risk  (09/04/2023)  Health Literacy: Inadequate Health Literacy (10/23/2022)   SDOH Interventions:     Readmission Risk Interventions     No data to display

## 2023-09-06 NOTE — Progress Notes (Signed)
 PROGRESS NOTE  CANYON WILLOW  DOB: 1955/05/15  PCP: Purcell Emil Schanz, MD FMW:[id number]  DOA: 09/03/2023  LOS: 2 days  Hospital Day: 4  Brief narrative: Jacqueline Bonilla is a 68 y.o. female with PMH significant for HTN, stroke with residual right-sided weakness, vascular dementia anemia, anxiety, depression who was recently hospitalized 8/14 to 8/25 for status epilepticus, required mechanical ventilation for 2 days and was ultimately discharged to SNF.  Prior to that hospitalization, patient was on Depakote  which was then switched to Keppra  by neurology. 9/2, patient was brought to the ED as a code stroke with complaint of left-sided weakness, difficulty speaking  Initial CT head unremarkable for acute intracranial abnormality, showed brain atrophy, chronic small vessel ischemic disease and chronic infarcts. Seen by neurology.  On exam, patient had some right gaze deviation which resolved spontaneously, aphasia improved as well. Per neurology, her symptoms could have been likely related to seizures. Keppra  level was sent. Started on Keppra  and Depakote  Admitted to [facility] Long-term EEG monitoring started  Subjective: Patient was seen and examined this morning. Pleasant elderly Caucasian female.  Sitting up in recliner.  Somnolent, open eyes on command.  Preferred to talk with eyes closed. No family at bedside Chart reviewed In the last 24 hours, afebrile, hemodynamically stable, breathing room air Most recent labs from 9/3 unremarkable  Assessment and plan: Breakthrough seizures Recent hospitalization for status epilepticus Presented this time with left-sided weakness and difficulty speaking -spontaneously improved in the ED Imagings negative for new stroke Per neurology, symptoms likely secondary to breakthrough seizure Keppra  level was therapeutic. Long-term EEG monitoring was suggestive of cortical dysfunction in the left hemisphere likely secondary to underlying  structural abnormality, no seizures were noted. Per neurology recommendation, currently patient is on Keppra  1 g twice daily and Depakote  250 mg twice daily. Patient seems somnolent today.  Will monitor for next 24 hours to see some improvement in somnolence.  If not, may need to cut back on Depakote .  H/o stroke with residual right-sided weakness HLD Continue aspirin  and statin  Essential hypertension Hemodynamically stable without meds  Generalized weakness Impaired mobility Seen by PT.  CIR recommended.     Mobility:  PT Orders: Active   PT Follow up Rec: Acute Inpatient Rehab (3hours/Day)09/05/2023 1225.  However not a candidate for CIR, SNF awaited for   Goals of care   Code Status: Full Code     DVT prophylaxis:  enoxaparin  (LOVENOX ) injection 40 mg Start: 09/04/23 1000   Antimicrobials: None Fluid: None Consultants: Neurology Family Communication: None at bedside  Status: Inpatient Level of care:  Progressive   Patient is from: Home Needs to continue in-hospital care: Monitor for somnolence Anticipated d/c to: SNF      Diet:  Diet Order             Diet Heart Room service appropriate? Yes; Fluid consistency: Thin  Diet effective now                   Scheduled Meds:  aspirin   81 mg Oral Daily   atorvastatin   40 mg Oral Daily   divalproex   250 mg Oral Q12H   enoxaparin  (LOVENOX ) injection  40 mg Subcutaneous Q24H   levETIRAcetam   1,000 mg Oral BID   sodium chloride  flush  3 mL Intravenous Once    PRN meds: acetaminophen  **OR** acetaminophen    Infusions:    Antimicrobials: Anti-infectives (From admission, onward)    None       Objective:  Vitals:   09/06/23 0802 09/06/23 1211  BP: (!) 145/89 117/75  Pulse: 75 (!) 102  Resp: 16 16  Temp: 97.6 F (36.4 C) 98.2 F (36.8 C)  SpO2: 100% 99%    Intake/Output Summary (Last 24 hours) at 09/06/2023 1248 Last data filed at 09/05/2023 1300 Gross per 24 hour  Intake 240 ml  Output  --  Net 240 ml   There were no vitals filed for this visit. Weight change:  There is no height or weight on file to calculate BMI.   Physical Exam: General exam: Pleasant, elderly Caucasian female.  Not in physical distress Skin: No rashes, lesions or ulcers. HEENT: Atraumatic, normocephalic, no obvious bleeding Lungs: Clear to auscultation bilaterally,  CVS: S1, S2, no murmur,   GI/Abd: Soft, nontender, nondistended, bowel sound present,   CNS: Somnolent, opens eyes on command.  Prefers to talk with eyes closed.  On exam, residual weakness on the right side from previous stroke noted Psychiatry: Sad affect Extremities: No pedal edema, no calf tenderness,   Data Review: I have personally reviewed the laboratory data and studies available.  F/u labs ordered Unresulted Labs (From admission, onward)     Start     Ordered   09/11/23 0500  Creatinine, serum  (enoxaparin  (LOVENOX )    CrCl >/= 30 ml/min)  Weekly,   R     Comments: while on enoxaparin  therapy    09/04/23 0339   09/04/23 0339  CBC  (enoxaparin  (LOVENOX )    CrCl >/= 30 ml/min)  Once,   R       Comments: Baseline for enoxaparin  therapy IF NOT ALREADY DRAWN.  Notify MD if PLT < 100 K.    09/04/23 0339            Signed, Chapman Rota, MD [facility] 09/06/2023

## 2023-09-06 NOTE — Plan of Care (Signed)

## 2023-09-06 NOTE — TOC Progression Note (Signed)
 Transition of Care [facility]) - Progression Note    Patient Details  Name: TARAH BUBOLTZ MRN: [id number] Date of Birth: 25-Apr-1955  [facility]) CM/SW Contact  Almarie [name], [name] Phone Number: 09/06/2023, 3:42 PM  Clinical Narrative:   CSW updated [name] with SNF options, and [name] would like to return to [facility]. [name] will not have a bed available until Monday. CSW to follow.    Expected Discharge Plan: Skilled Nursing Facility Barriers to Discharge: Continued Medical Work up, English as a second language teacher               Expected Discharge Plan and Services     Post Acute Care Choice: Skilled Nursing Facility Living arrangements for the past 2 months: Skilled Nursing Facility, Single Family Home                                       Social Drivers of Health (SDOH) Interventions SDOH Screenings   Food Insecurity: No Food Insecurity (09/05/2023)  Housing: Low Risk  (09/05/2023)  Transportation Needs: No Transportation Needs (09/05/2023)  Utilities: At Risk (09/05/2023)  Alcohol Screen: Low Risk  (10/23/2022)  Depression (PHQ2-9): Low Risk  (07/24/2023)  Financial Resource Strain: Low Risk  (10/23/2022)  Physical Activity: Sufficiently Active (10/23/2022)  Social Connections: Moderately Isolated (09/05/2023)  Stress: No Stress Concern Present (08/28/2021)  Tobacco Use: Low Risk  (09/04/2023)  Health Literacy: Inadequate Health Literacy (10/23/2022)    Readmission Risk Interventions     No data to display

## 2023-09-07 ENCOUNTER — Encounter (HOSPITAL_COMMUNITY): Payer: Self-pay | Admitting: Family Medicine

## 2023-09-07 DIAGNOSIS — R569 Unspecified convulsions: Secondary | ICD-10-CM | POA: Diagnosis not present

## 2023-09-07 NOTE — Plan of Care (Signed)

## 2023-09-07 NOTE — Progress Notes (Signed)
 PROGRESS NOTE  Jacqueline Bonilla  DOB: [birth year]  PCP: Purcell Emil Schanz, MD FMW:[id number]  DOA: 09/03/2023  LOS: 3 days  Hospital Day: 5  Brief narrative: Jacqueline Bonilla is a 68 y.o. female with PMH significant for HTN, stroke with residual right-sided weakness, vascular dementia anemia, anxiety, depression who was recently hospitalized 8/14 to 8/25 for status epilepticus, required mechanical ventilation for 2 days and was ultimately discharged to SNF.  Prior to that hospitalization, patient was on Depakote  which was then switched to Keppra  by neurology. 9/2, patient was brought to the ED as a code stroke with complaint of left-sided weakness, difficulty speaking  Initial CT head unremarkable for acute intracranial abnormality, showed brain atrophy, chronic small vessel ischemic disease and chronic infarcts. Seen by neurology.  On exam, patient had some right gaze deviation which resolved spontaneously, aphasia improved as well. Per neurology, her symptoms could have been likely related to seizures. Keppra  level was sent. Started on Keppra  and Depakote  Admitted to [facility] Long-term EEG monitoring started  Subjective: Patient was seen and examined this morning. Sitting up in recliner.  Not in distress.  More awake than yesterday.  Able to have a meaningful conversation. She is frustrated that the SNF cannot take her till Monday.  Assessment and plan: Breakthrough seizures Recent hospitalization for status epilepticus Presented this time with left-sided weakness and difficulty speaking -spontaneously improved in the ED Imagings negative for new stroke Per neurology, symptoms likely secondary to breakthrough seizure Keppra  level was therapeutic. Long-term EEG monitoring was suggestive of cortical dysfunction in the left hemisphere likely secondary to underlying structural abnormality, no seizures were noted. Per neurology recommendation, currently patient is on Keppra  1 g twice  daily and Depakote  250 mg twice daily. Concerned about her somnolence yesterday but much more awake and conversational today.  H/o stroke with residual right-sided weakness HLD Continue aspirin  and statin  Essential hypertension Hemodynamically stable without meds  Generalized weakness Impaired mobility Seen by PT.  CIR recommended but did not qualify.  SNF pending   Goals of care   Code Status: Full Code     DVT prophylaxis:  enoxaparin  (LOVENOX ) injection 40 mg Start: 09/04/23 1000   Antimicrobials: None Fluid: None Consultants: Neurology Family Communication: None at bedside  Status: Inpatient Level of care:  Progressive   Patient is from: Home Needs to continue in-hospital care: Stable.  Pending SNF.  Tentatively able to accept on Monday     Diet:  Diet Order             Diet Heart Room service appropriate? Yes; Fluid consistency: Thin  Diet effective now                   Scheduled Meds:  aspirin   81 mg Oral Daily   atorvastatin   40 mg Oral Daily   divalproex   250 mg Oral Q12H   enoxaparin  (LOVENOX ) injection  40 mg Subcutaneous Q24H   levETIRAcetam   1,000 mg Oral BID   sodium chloride  flush  3 mL Intravenous Once    PRN meds: acetaminophen  **OR** acetaminophen    Infusions:    Antimicrobials: Anti-infectives (From admission, onward)    None       Objective: Vitals:   09/07/23 0634 09/07/23 0821  BP: 116/80 126/85  Pulse: 81 64  Resp:  15  Temp: 98.4 F (36.9 C) (!) 97.4 F (36.3 C)  SpO2: 100% 99%    Intake/Output Summary (Last 24 hours) at 09/07/2023 1040 Last data filed at  09/07/2023 0900 Gross per 24 hour  Intake 356 ml  Output 250 ml  Net 106 ml   There were no vitals filed for this visit. Weight change:  There is no height or weight on file to calculate BMI.   Physical Exam: General exam: Pleasant, elderly Caucasian female.  Not in physical distress Skin: No rashes, lesions or ulcers. HEENT: Atraumatic,  normocephalic, no obvious bleeding Lungs: Clear to auscultation bilaterally,  CVS: S1, S2, no murmur,   GI/Abd: Soft, nontender, nondistended, bowel sound present,   CNS: Alert, awake, oriented x 3.  On exam, residual weakness on the right side from previous stroke noted Psychiatry: Sad affect Extremities: No pedal edema, no calf tenderness,   Data Review: I have personally reviewed the laboratory data and studies available.  F/u labs ordered Unresulted Labs (From admission, onward)     Start     Ordered   09/11/23 0500  Creatinine, serum  (enoxaparin  (LOVENOX )    CrCl >/= 30 ml/min)  Weekly,   R     Comments: while on enoxaparin  therapy    09/04/23 0339   09/04/23 0339  CBC  (enoxaparin  (LOVENOX )    CrCl >/= 30 ml/min)  Once,   R       Comments: Baseline for enoxaparin  therapy IF NOT ALREADY DRAWN.  Notify MD if PLT < 100 K.    09/04/23 0339            Signed, Chapman Rota, MD [facility] 09/07/2023

## 2023-09-07 NOTE — Plan of Care (Signed)

## 2023-09-08 DIAGNOSIS — R569 Unspecified convulsions: Secondary | ICD-10-CM | POA: Diagnosis not present

## 2023-09-08 NOTE — Plan of Care (Signed)

## 2023-09-08 NOTE — Progress Notes (Signed)
 PROGRESS NOTE  Jacqueline Bonilla  DOB: 23-Dec-1955  PCP: Purcell Emil Schanz, MD FMW:[id number]  DOA: 09/03/2023  LOS: 4 days  Hospital Day: 6  Brief narrative: Jacqueline Bonilla is a 68 y.o. female with PMH significant for HTN, stroke with residual right-sided weakness, vascular dementia anemia, anxiety, depression who was recently hospitalized 8/14 to 8/25 for status epilepticus, required mechanical ventilation for 2 days and was ultimately discharged to SNF.  Prior to that hospitalization, patient was on Depakote  which was then switched to Keppra  by neurology. 9/2, patient was brought to the ED as a code stroke with complaint of left-sided weakness, difficulty speaking  Initial CT head unremarkable for acute intracranial abnormality, showed brain atrophy, chronic small vessel ischemic disease and chronic infarcts. Seen by neurology.  On exam, patient had some right gaze deviation which resolved spontaneously, aphasia improved as well. Per neurology, her symptoms could have been likely related to seizures. Keppra  level was sent. Started on Keppra  and Depakote  Admitted to [facility] Long-term EEG monitoring started  Subjective: Patient was seen and examined this morning. Sitting up in recliner.  Not in distress.  Alert, awake, cheerful.  RN at bedside.  No issues in the last 24 hours.  Remained awake yesterday afternoon and slept well last night. Hopefully SNF can take her back tomorrow  Assessment and plan: Breakthrough seizures Recent hospitalization for status epilepticus Presented this time with left-sided weakness and difficulty speaking -spontaneously improved in the ED Imagings negative for new stroke Per neurology, symptoms likely secondary to breakthrough seizure Keppra  level was therapeutic. Long-term EEG monitoring was suggestive of cortical dysfunction in the left hemisphere likely secondary to underlying structural abnormality, no seizures were noted. Per neurology  recommendation, currently patient is on Keppra  1 g twice daily and Depakote  250 mg twice daily. Has been tolerating his medicines well without any somnolence.  H/o stroke with residual right-sided weakness HLD Continue aspirin  and statin  Essential hypertension Hemodynamically stable without meds  Generalized weakness Impaired mobility Seen by PT.  CIR recommended but did not qualify.   Plan to send back to SNF   Goals of care   Code Status: Full Code     DVT prophylaxis:  enoxaparin  (LOVENOX ) injection 40 mg Start: 09/04/23 1000   Antimicrobials: None Fluid: None Consultants: Neurology Family Communication: None at bedside  Status: Inpatient Level of care:  Progressive   Patient is from: Home Needs to continue in-hospital care: Stable.  Pending SNF.  Tentatively able to accept on Monday    Diet:  Diet Order             Diet Heart Room service appropriate? Yes; Fluid consistency: Thin  Diet effective now                   Scheduled Meds:  aspirin   81 mg Oral Daily   atorvastatin   40 mg Oral Daily   divalproex   250 mg Oral Q12H   enoxaparin  (LOVENOX ) injection  40 mg Subcutaneous Q24H   levETIRAcetam   1,000 mg Oral BID   sodium chloride  flush  3 mL Intravenous Once    PRN meds: acetaminophen  **OR** acetaminophen    Infusions:    Antimicrobials: Anti-infectives (From admission, onward)    None       Objective: Vitals:   09/08/23 0600 09/08/23 0823  BP:  117/77  Pulse:  74  Resp: 15   Temp:  98.3 F (36.8 C)  SpO2:  99%    Intake/Output Summary (Last 24 hours)  at 09/08/2023 0936 Last data filed at 09/08/2023 0500 Gross per 24 hour  Intake 1080 ml  Output 750 ml  Net 330 ml   Filed Weights   09/07/23 1900  Weight: 49.4 kg   Weight change:  Body mass index is 18.68 kg/m.   Physical Exam: General exam: Pleasant, elderly Caucasian female.  Not in physical distress Skin: No rashes, lesions or ulcers. HEENT: Atraumatic,  normocephalic, no obvious bleeding Lungs: Clear to auscultation bilaterally,  CVS: S1, S2, no murmur,   GI/Abd: Soft, nontender, nondistended, bowel sound present,   CNS: Alert, awake, oriented x 3.  On exam, residual weakness on the right side from previous stroke noted Psychiatry: Mood appropriate.  Cheerful Extremities: No pedal edema, no calf tenderness,   Data Review: I have personally reviewed the laboratory data and studies available.  F/u labs ordered Unresulted Labs (From admission, onward)     Start     Ordered   09/11/23 0500  Creatinine, serum  (enoxaparin  (LOVENOX )    CrCl >/= 30 ml/min)  Weekly,   R     Comments: while on enoxaparin  therapy    09/04/23 0339   09/04/23 0339  CBC  (enoxaparin  (LOVENOX )    CrCl >/= 30 ml/min)  Once,   R       Comments: Baseline for enoxaparin  therapy IF NOT ALREADY DRAWN.  Notify MD if PLT < 100 K.    09/04/23 0339            Signed, Chapman Rota, MD [facility] 09/08/2023

## 2023-09-09 DIAGNOSIS — R569 Unspecified convulsions: Secondary | ICD-10-CM | POA: Diagnosis not present

## 2023-09-09 MED ORDER — DIVALPROEX SODIUM 250 MG PO DR TAB: 250.0000 mg | DELAYED_RELEASE_TABLET | Freq: Two times a day (BID) | ORAL | Status: DC

## 2023-09-09 NOTE — Progress Notes (Signed)
 Occupational Therapy Treatment Patient Details Name: Jacqueline Bonilla MRN: [id number] DOB: 1955-10-04 Today's Date: 09/09/2023   History of present illness Jacqueline Bonilla is a 68 y.o. female who presented with right-sided facial droop and slurred speech. When she arrived in the ED, she began seizing. CT head was negative for any acute processes.  Multiple remote infarcts as well as chronic small vessel disease with parenchymal volume loss was noted. LTM EEG pending. PMH significant of 2 left MCA territory infarcts participating in the [employer] trial, loop recorder placement, vascular dementia, sleep apnea, HLD, thrombocytopenia, osteoporosis, HTN, depression, anxiety and anemia   OT comments  Patient received seated in recliner demonstrating left lateral leaning but was able to correct with verbal cues. Patient appeared lethargic but agreeable to OT treatment.  Standing performed at sink with mod assist to stand and for balance while standing and patient returned to recliner to perform grooming tasks. Patient requiring increased assistance and cues to perform. Patient assisted back to bed due to lethargic with mod assist  for transfer and min assist for bed mobility. Patient will benefit from continued inpatient follow up therapy, <3 hours/day.  Acute OT to continue to follow to address established goals to facilitate DC to next venue of care.        If plan is discharge home, recommend the following:  Assist for transportation;Assistance with cooking/housework;A lot of help with walking and/or transfers;A lot of help with bathing/dressing/bathroom;Direct supervision/assist for medications management   Equipment Recommendations  None recommended by OT    Recommendations for Other Services      Precautions / Restrictions Precautions Precautions: Fall Recall of Precautions/Restrictions: Impaired Precaution/Restrictions Comments: L inattention Restrictions Weight Bearing Restrictions Per  Provider Order: No       Mobility Bed Mobility Overal bed mobility: Needs Assistance Bed Mobility: Sit to Supine       Sit to supine: Min assist   General bed mobility comments: assistance with BLEs    Transfers Overall transfer level: Needs assistance Equipment used: None Transfers: Sit to/from Stand, Bed to chair/wheelchair/BSC Sit to Stand: Mod assist Stand pivot transfers: Mod assist         General transfer comment: mod assist to power up from recliner and to pivot to EOB     Balance Overall balance assessment: Needs assistance Sitting-balance support: Feet supported Sitting balance-Leahy Scale: Poor Sitting balance - Comments: CGA on EOB before go to supine   Standing balance support: Bilateral upper extremity supported, During functional activity Standing balance-Leahy Scale: Poor Standing balance comment: reliant on UE support when standing at sink with assistance from therapist for balance                           ADL either performed or assessed with clinical judgement   ADL Overall ADL's : Needs assistance/impaired     Grooming: Wash/dry hands;Wash/dry face;Brushing hair;Minimal assistance;Sitting Grooming Details (indicate cue type and reason): attempted to perform in standing with unsteady balance                               General ADL Comments: limited due to lethargic    Extremity/Trunk Assessment              Vision       Perception     Praxis     Communication Communication Communication: Impaired Factors Affecting Communication: Reduced clarity of speech;Difficulty  expressing self   Cognition Arousal: Lethargic Behavior During Therapy: Flat affect Cognition: History of cognitive impairments             OT - Cognition Comments: difficulty following directions due to lethargic                 Following commands: Impaired Following commands impaired: Follows one step commands with  increased time, Follows one step commands inconsistently      Cueing   Cueing Techniques: Verbal cues, Gestural cues, Tactile cues  Exercises      Shoulder Instructions       General Comments      Pertinent Vitals/ Pain       Pain Assessment Pain Assessment: Faces Faces Pain Scale: Hurts a little bit Pain Location: right wrist Pain Descriptors / Indicators: Sore Pain Intervention(s): Monitored during session  Home Living                                          Prior Functioning/Environment              Frequency  Min 2X/week        Progress Toward Goals  OT Goals(current goals can now be found in the care plan section)  Progress towards OT goals: Progressing toward goals (limited on this date)  Acute Rehab OT Goals Patient Stated Goal: none stated OT Goal Formulation: With patient Time For Goal Achievement: 09/19/23 Potential to Achieve Goals: Good ADL Goals Pt Will Perform Eating: with modified independence;sitting Pt Will Perform Grooming: with modified independence Pt Will Perform Upper Body Bathing: with min assist;sitting Pt Will Perform Lower Body Bathing: with min assist;sitting/lateral leans Pt Will Perform Upper Body Dressing: with modified independence Pt Will Perform Lower Body Dressing: with supervision Pt Will Transfer to Toilet: with contact guard assist;stand pivot transfer;bedside commode Additional ADL Goal #1: Pt will sequence thruogh ADL task with supervision A only to demonstrate improved cognition  Plan      Co-evaluation                 AM-PAC OT 6 Clicks Daily Activity     Outcome Measure   Help from another person eating meals?: A Lot Help from another person taking care of personal grooming?: A Little Help from another person toileting, which includes using toliet, bedpan, or urinal?: A Lot Help from another person bathing (including washing, rinsing, drying)?: A Lot Help from another person to  put on and taking off regular upper body clothing?: A Little Help from another person to put on and taking off regular lower body clothing?: A Lot 6 Click Score: 14    End of Session Equipment Utilized During Treatment: Gait belt  OT Visit Diagnosis: Unsteadiness on feet (R26.81);Other abnormalities of gait and mobility (R26.89);Muscle weakness (generalized) (M62.81);History of falling (Z91.81);Feeding difficulties (R63.3);Hemiplegia and hemiparesis;Other symptoms and signs involving the nervous system (R29.898);Ataxia, unspecified (R27.0) Hemiplegia - Right/Left: Left Hemiplegia - dominant/non-dominant: Dominant Hemiplegia - caused by: Cerebral infarction   Activity Tolerance Patient limited by lethargy   Patient Left in bed;with call bell/phone within reach;with bed alarm set   Nurse Communication Mobility status        Time: 9046-8988 OT Time Calculation (min): 18 min  Charges: OT General Charges $OT Visit: 1 Visit OT Treatments $Self Care/Home Management : 8-22 mins  Dick Bonilla, OTA Acute Rehabilitation Services  Office [phone]  Jacqueline Bonilla 09/09/2023, 10:19 AM

## 2023-09-09 NOTE — Care Management Important Message (Signed)
 Important Message  Patient Details  Name: Jacqueline Bonilla MRN: [id number] Date of Birth: 10-26-1955   Important Message Given:  Yes - Medicare IM     Claretta Deed 09/09/2023, 3:46 PM

## 2023-09-09 NOTE — Plan of Care (Signed)
  Problem: Education: Goal: Knowledge of General Education information will improve Description: Including pain rating scale, medication(s)/side effects and non-pharmacologic comfort measures Outcome: Not Progressing   Problem: Health Behavior/Discharge Planning: Goal: Ability to manage health-related needs will improve Outcome: Not Progressing   Problem: Clinical Measurements: Goal: Ability to maintain clinical measurements within normal limits will improve Outcome: Progressing Goal: Will remain free from infection Outcome: Progressing Goal: Diagnostic test results will improve Outcome: Progressing Goal: Respiratory complications will improve Outcome: Progressing Goal: Cardiovascular complication will be avoided Outcome: Progressing   Problem: Activity: Goal: Risk for activity intolerance will decrease Outcome: Progressing   Problem: Nutrition: Goal: Adequate nutrition will be maintained Outcome: Not Progressing   Problem: Coping: Goal: Level of anxiety will decrease Outcome: Not Progressing   Problem: Elimination: Goal: Will not experience complications related to bowel motility Outcome: Not Progressing Goal: Will not experience complications related to urinary retention Outcome: Not Progressing   Problem: Pain Managment: Goal: General experience of comfort will improve and/or be controlled Outcome: Not Progressing   Problem: Safety: Goal: Ability to remain free from injury will improve Outcome: Not Progressing   Problem: Skin Integrity: Goal: Risk for impaired skin integrity will decrease Outcome: Not Progressing

## 2023-09-09 NOTE — Progress Notes (Signed)
 PROGRESS NOTE  Jacqueline Bonilla  DOB: 10/13/1955  PCP: Purcell Emil Schanz, MD FMW:[id number]  DOA: 09/03/2023  LOS: 5 days  Hospital Day: 7  Brief narrative: Jacqueline Bonilla is a 68 y.o. female with PMH significant for HTN, stroke with residual right-sided weakness, vascular dementia anemia, anxiety, depression who was recently hospitalized 8/14 to 8/25 for status epilepticus, required mechanical ventilation for 2 days and was ultimately discharged to SNF.  Prior to that hospitalization, patient was on Depakote  which was then switched to Keppra  by neurology. 9/2, patient was brought to the ED as a code stroke with complaint of left-sided weakness, difficulty speaking  Initial CT head unremarkable for acute intracranial abnormality, showed brain atrophy, chronic small vessel ischemic disease and chronic infarcts. Seen by neurology.  On exam, patient had some right gaze deviation which resolved spontaneously, aphasia improved as well. Per neurology, her symptoms could have been likely related to seizures. Keppra  level was sent. Started on Keppra  and Depakote  Admitted to [facility] Long-term EEG monitoring started  Subjective: Patient was seen and examined this morning. Not in distress.  Looks a little depressed today and was tearful but not in any pain.  After conversation, her mood improved. Insurance authorization pending for SNF.  Assessment and plan: Breakthrough seizures Recent hospitalization for status epilepticus Presented this time with left-sided weakness and difficulty speaking -spontaneously improved in the ED Imagings negative for new stroke Per neurology, symptoms likely secondary to breakthrough seizure Keppra  level was therapeutic. Long-term EEG monitoring was suggestive of cortical dysfunction in the left hemisphere likely secondary to underlying structural abnormality, no seizures were noted. Per neurology recommendation, currently patient is on Keppra  1 g twice  daily and Depakote  250 mg twice daily. Has been tolerating seizure medicines well without any somnolence.  H/o stroke with residual right-sided weakness HLD Continue aspirin  and statin  Essential hypertension Hemodynamically stable without meds  Generalized weakness Impaired mobility Seen by PT.  CIR recommended but did not qualify.   SNF authorization pending   Goals of care   Code Status: Full Code     DVT prophylaxis:  enoxaparin  (LOVENOX ) injection 40 mg Start: 09/04/23 1000   Antimicrobials: None Fluid: None Consultants: Neurology Family Communication: None at bedside  Status: Inpatient Level of care:  Progressive   Patient is from: Home Needs to continue in-hospital care: Stable.  Pending SNF authorization    Diet:  Diet Order             Diet Heart Room service appropriate? Yes; Fluid consistency: Thin  Diet effective now                   Scheduled Meds:  aspirin   81 mg Oral Daily   atorvastatin   40 mg Oral Daily   divalproex   250 mg Oral Q12H   enoxaparin  (LOVENOX ) injection  40 mg Subcutaneous Q24H   levETIRAcetam   1,000 mg Oral BID   sodium chloride  flush  3 mL Intravenous Once    PRN meds: acetaminophen  **OR** acetaminophen    Infusions:    Antimicrobials: Anti-infectives (From admission, onward)    None       Objective: Vitals:   09/09/23 0838 09/09/23 1244  BP: 125/81 116/70  Pulse: 99 87  Resp: 12 16  Temp:  98 F (36.7 C)  SpO2: 96% 96%    Intake/Output Summary (Last 24 hours) at 09/09/2023 1404 Last data filed at 09/08/2023 2336 Gross per 24 hour  Intake 240 ml  Output 300 ml  Net -60 ml   Filed Weights   09/07/23 1900  Weight: 49.4 kg   Weight change:  Body mass index is 18.68 kg/m.   Physical Exam: General exam: Pleasant, elderly Caucasian female.  Not in physical distress Skin: No rashes, lesions or ulcers. HEENT: Atraumatic, normocephalic, no obvious bleeding Lungs: Clear to auscultation  bilaterally,  CVS: S1, S2, no murmur,   GI/Abd: Soft, nontender, nondistended, bowel sound present,   CNS: Alert, awake, oriented x 3.  On exam, residual weakness on the right side from previous stroke noted Psychiatry: Sad affect today Extremities: No pedal edema, no calf tenderness,   Data Review: I have personally reviewed the laboratory data and studies available.  F/u labs ordered Unresulted Labs (From admission, onward)     Start     Ordered   09/11/23 0500  Creatinine, serum  (enoxaparin  (LOVENOX )    CrCl >/= 30 ml/min)  Weekly,   R     Comments: while on enoxaparin  therapy    09/04/23 0339   09/04/23 0339  CBC  (enoxaparin  (LOVENOX )    CrCl >/= 30 ml/min)  Once,   R       Comments: Baseline for enoxaparin  therapy IF NOT ALREADY DRAWN.  Notify MD if PLT < 100 K.    09/04/23 0339            Signed, Chapman Rota, MD [facility] 09/09/2023

## 2023-09-09 NOTE — Discharge Summary (Deleted)
 PROGRESS NOTE  Jacqueline Bonilla  DOB: [birth year]  PCP: Purcell Emil Schanz, MD FMW:[id number]  DOA: 09/03/2023  LOS: 5 days  Hospital Day: 7  Brief narrative: Jacqueline Bonilla is a 68 y.o. female with PMH significant for HTN, stroke with residual right-sided weakness, vascular dementia anemia, anxiety, depression who was recently hospitalized 8/14 to 8/25 for status epilepticus, required mechanical ventilation for 2 days and was ultimately discharged to SNF.  Prior to that hospitalization, patient was on Depakote  which was then switched to Keppra  by neurology. 9/2, patient was brought to the ED as a code stroke with complaint of left-sided weakness, difficulty speaking  Initial CT head unremarkable for acute intracranial abnormality, showed brain atrophy, chronic small vessel ischemic disease and chronic infarcts. Seen by neurology.  On exam, patient had some right gaze deviation which resolved spontaneously, aphasia improved as well. Per neurology, her symptoms could have been likely related to seizures. Keppra  level was sent. Started on Keppra  and Depakote  Admitted to [facility] Long-term EEG monitoring started  Subjective: Patient was seen and examined this morning. Not in distress.  Looks a little depressed today and was tearful but not in any pain.  After conversation, her mood improved. Insurance authorization pending for SNF.  Assessment and plan: Breakthrough seizures Recent hospitalization for status epilepticus Presented this time with left-sided weakness and difficulty speaking -spontaneously improved in the ED Imagings negative for new stroke Per neurology, symptoms likely secondary to breakthrough seizure Keppra  level was therapeutic. Long-term EEG monitoring was suggestive of cortical dysfunction in the left hemisphere likely secondary to underlying structural abnormality, no seizures were noted. Per neurology recommendation, currently patient is on Keppra  1 g twice  daily and Depakote  250 mg twice daily. Has been tolerating seizure medicines well without any somnolence.  H/o stroke with residual right-sided weakness HLD Continue aspirin  and statin  Essential hypertension Hemodynamically stable without meds  Generalized weakness Impaired mobility Seen by PT.  CIR recommended but did not qualify.   SNF authorization pending   Goals of care   Code Status: Full Code     DVT prophylaxis:  enoxaparin  (LOVENOX ) injection 40 mg Start: 09/04/23 1000   Antimicrobials: None Fluid: None Consultants: Neurology Family Communication: None at bedside  Status: Inpatient Level of care:  Progressive   Patient is from: Home Needs to continue in-hospital care: Stable.  Pending SNF authorization    Diet:  Diet Order             Diet Heart Room service appropriate? Yes; Fluid consistency: Thin  Diet effective now                   Scheduled Meds:  aspirin   81 mg Oral Daily   atorvastatin   40 mg Oral Daily   divalproex   250 mg Oral Q12H   enoxaparin  (LOVENOX ) injection  40 mg Subcutaneous Q24H   levETIRAcetam   1,000 mg Oral BID   sodium chloride  flush  3 mL Intravenous Once    PRN meds: acetaminophen  **OR** acetaminophen    Infusions:    Antimicrobials: Anti-infectives (From admission, onward)    None       Objective: Vitals:   09/09/23 0838 09/09/23 1244  BP: 125/81 116/70  Pulse: 99 87  Resp: 12 16  Temp:  98 F (36.7 C)  SpO2: 96% 96%    Intake/Output Summary (Last 24 hours) at 09/09/2023 1403 Last data filed at 09/08/2023 2336 Gross per 24 hour  Intake 240 ml  Output 300 ml  Net -60 ml   Filed Weights   09/07/23 1900  Weight: 49.4 kg   Weight change:  Body mass index is 18.68 kg/m.   Physical Exam: General exam: Pleasant, elderly Caucasian female.  Not in physical distress Skin: No rashes, lesions or ulcers. HEENT: Atraumatic, normocephalic, no obvious bleeding Lungs: Clear to auscultation  bilaterally,  CVS: S1, S2, no murmur,   GI/Abd: Soft, nontender, nondistended, bowel sound present,   CNS: Alert, awake, oriented x 3.  On exam, residual weakness on the right side from previous stroke noted Psychiatry: Sad affect today Extremities: No pedal edema, no calf tenderness,   Data Review: I have personally reviewed the laboratory data and studies available.  F/u labs ordered Unresulted Labs (From admission, onward)     Start     Ordered   09/11/23 0500  Creatinine, serum  (enoxaparin  (LOVENOX )    CrCl >/= 30 ml/min)  Weekly,   R     Comments: while on enoxaparin  therapy    09/04/23 0339   09/04/23 0339  CBC  (enoxaparin  (LOVENOX )    CrCl >/= 30 ml/min)  Once,   R       Comments: Baseline for enoxaparin  therapy IF NOT ALREADY DRAWN.  Notify MD if PLT < 100 K.    09/04/23 0339            Signed, Chapman Rota, MD [facility] 09/09/2023

## 2023-09-09 NOTE — TOC Progression Note (Signed)
 Transition of Care [facility]) - Progression Note    Patient Details  Name: Jacqueline Bonilla MRN: [id number] Date of Birth: 12-Oct-1955  Transition of Care [facility]) CM/SW Contact  Almarie [name], [name] Phone Number: 09/09/2023, 2:51 PM  Clinical Narrative:   CSW coordinated with PT and OT for updated notes, and contacted CMA to request insurance authorization. [name] has a bed available. CSW to follow.    Expected Discharge Plan: Skilled Nursing Facility Barriers to Discharge: Continued Medical Work up, English as a second language teacher               Expected Discharge Plan and Services     Post Acute Care Choice: Skilled Nursing Facility Living arrangements for the past 2 months: Skilled Nursing Facility, Single Family Home                                       Social Drivers of Health (SDOH) Interventions SDOH Screenings   Food Insecurity: No Food Insecurity (09/05/2023)  Housing: Low Risk  (09/05/2023)  Transportation Needs: No Transportation Needs (09/05/2023)  Utilities: At Risk (09/05/2023)  Alcohol Screen: Low Risk  (10/23/2022)  Depression (PHQ2-9): Low Risk  (07/24/2023)  Financial Resource Strain: Low Risk  (10/23/2022)  Physical Activity: Sufficiently Active (10/23/2022)  Social Connections: Moderately Isolated (09/05/2023)  Stress: No Stress Concern Present (08/28/2021)  Tobacco Use: Low Risk  (09/04/2023)  Health Literacy: Inadequate Health Literacy (10/23/2022)    Readmission Risk Interventions     No data to display

## 2023-09-09 NOTE — Progress Notes (Signed)
 Physical Therapy Treatment Patient Details Name: Jacqueline Bonilla MRN: [id number] DOB: 08/08/1955 Today's Date: 09/09/2023   History of Present Illness Jacqueline Bonilla is a 68 y.o. female who presented with right-sided facial droop and slurred speech. When she arrived in the ED, she began seizing. CT head was negative for any acute processes.  Multiple remote infarcts as well as chronic small vessel disease with parenchymal volume loss was noted. LTM EEG pending. PMH significant of 2 left MCA territory infarcts participating in the [place] trial, loop recorder placement, vascular dementia, sleep apnea, HLD, thrombocytopenia, osteoporosis, HTN, depression, anxiety and anemia    PT Comments  Pt with regression towards her physical therapy goals, in comparison to last PT session on 9/4, pt requiring increased assist for transfers and is unable to ambulate. Pt requiring moderate assist for bed mobility and transfers. Worked on sit to stands from [employer] for functional strengthening and LE power. Patient will benefit from continued inpatient follow up therapy, <3 hours/day.    If plan is discharge home, recommend the following: Two people to help with walking and/or transfers;Two people to help with bathing/dressing/bathroom   Can travel by private vehicle     No  Equipment Recommendations  None recommended by PT    Recommendations for Other Services       Precautions / Restrictions Precautions Precautions: Fall Recall of Precautions/Restrictions: Impaired Precaution/Restrictions Comments: L inattention Restrictions Weight Bearing Restrictions Per Provider Order: No     Mobility  Bed Mobility Overal bed mobility: Needs Assistance Bed Mobility: Supine to Sit     Supine to sit: Mod assist     General bed mobility comments: Able to initiate, but difficulty with motor planning and sequencing, requiring assist to scoot out left hip    Transfers Overall transfer level: Needs  assistance Equipment used: None Transfers: Sit to/from Stand, Bed to chair/wheelchair/BSC Sit to Stand: Mod assist Stand pivot transfers: Mod assist         General transfer comment: Heavy modA to stand from edge of bed and pivot towards left to BSC, max cueing for reaching for armrest to guide shoulders/hips. Additional pivot to recliner and then performed multiple sit to stands from chair.    Ambulation/Gait               General Gait Details: unable   Stairs             Wheelchair Mobility     Tilt Bed    Modified Rankin (Stroke Patients Only)       Balance Overall balance assessment: Needs assistance Sitting-balance support: Feet supported Sitting balance-Leahy Scale: Fair     Standing balance support: Bilateral upper extremity supported, During functional activity Standing balance-Leahy Scale: Poor                              Communication Communication Communication: Impaired Factors Affecting Communication: Reduced clarity of speech;Difficulty expressing self  Cognition Arousal: Alert Behavior During Therapy: Flat affect   PT - Cognitive impairments: Sequencing, Problem solving, Attention, Initiation, Awareness, Safety/Judgement   Orientation impairments: Time, Place                   PT - Cognition Comments: Pt stating she is frustrated with care here, states [place] would be better; reminded her she was in [place]. Pt able to follow single step commands with increased time. Flat affect which is baseline from this therapist's  prior meeting with her Following commands: Impaired Following commands impaired: Follows one step commands with increased time    Cueing Cueing Techniques: Verbal cues, Gestural cues, Tactile cues  Exercises Other Exercises Other Exercises: x5 sit to stands    General Comments        Pertinent Vitals/Pain Pain Assessment Pain Assessment: No/denies pain    Home Living                           Prior Function            PT Goals (current goals can now be found in the care plan section) Acute Rehab PT Goals Patient Stated Goal: did not state PT Goal Formulation: With patient Time For Goal Achievement: 09/19/23 Potential to Achieve Goals: Fair Progress towards PT goals: Not progressing toward goals - comment    Frequency    Min 2X/week      PT Plan      Co-evaluation              AM-PAC PT 6 Clicks Mobility   Outcome Measure  Help needed turning from your back to your side while in a flat bed without using bedrails?: A Little Help needed moving from lying on your back to sitting on the side of a flat bed without using bedrails?: A Lot Help needed moving to and from a bed to a chair (including a wheelchair)?: A Lot Help needed standing up from a chair using your arms (e.g., wheelchair or bedside chair)?: A Lot Help needed to walk in hospital room?: Total Help needed climbing 3-5 steps with a railing? : Total 6 Click Score: 11    End of Session Equipment Utilized During Treatment: Gait belt Activity Tolerance: Patient tolerated treatment well Patient left: in chair;with call bell/phone within reach;with chair alarm set Nurse Communication: Mobility status PT Visit Diagnosis: Other abnormalities of gait and mobility (R26.89);Muscle weakness (generalized) (M62.81);Other symptoms and signs involving the nervous system (R29.898)     Time: 9151-9093 PT Time Calculation (min) (ACUTE ONLY): 18 min  Charges:    $Therapeutic Activity: 8-22 mins PT General Charges $$ ACUTE PT VISIT: 1 Visit                     Aleck Daring, PT, DPT Acute Rehabilitation Services Office [phone]   Aleck ONEIDA Daring 09/09/2023, 9:54 AM

## 2023-09-10 DIAGNOSIS — R569 Unspecified convulsions: Secondary | ICD-10-CM | POA: Diagnosis not present

## 2023-09-10 MED ORDER — ORAL CARE MOUTH RINSE: 15.0000 mL | OROMUCOSAL | Status: DC | PRN

## 2023-09-10 MED ORDER — MECLIZINE HCL 25 MG PO TABS: 12.5000 mg | ORAL_TABLET | Freq: Two times a day (BID) | ORAL | Status: DC

## 2023-09-10 MED ORDER — MECLIZINE HCL 25 MG PO TABS
12.5000 mg | ORAL_TABLET | Freq: Once | ORAL | Status: AC
  Administered 2023-09-10: 12.5 mg via ORAL
  Filled 2023-09-10: qty 1

## 2023-09-10 NOTE — Progress Notes (Signed)
 Physical Therapy Treatment Patient Details Name: Jacqueline Bonilla MRN: [id number] DOB: July 18, 1955 Today's Date: 09/10/2023   History of Present Illness Jacqueline Bonilla is a 68 y.o. female who presented with right-sided facial droop and slurred speech. When she arrived in the ED, she began seizing. CT head was negative for any acute processes.  Multiple remote infarcts as well as chronic small vessel disease with parenchymal volume loss was noted. LTM EEG pending. PMH significant of 2 left MCA territory infarcts participating in the [place] trial, loop recorder placement, vascular dementia, sleep apnea, HLD, thrombocytopenia, osteoporosis, HTN, depression, anxiety and anemia    PT Comments  Pt with improved mood; pt family member present and worked on chair exercises with her. Pt requiring two person min-mod assist for functional mobility. Able to ambulate in hallway today with 2 person HHA. Demonstrates festinating gait pattern in room, but increased speed and foot clearance once outside of room. Patient will benefit from continued inpatient follow up therapy, <3 hours/day.    If plan is discharge home, recommend the following: Two people to help with walking and/or transfers;Two people to help with bathing/dressing/bathroom   Can travel by private vehicle     No  Equipment Recommendations  None recommended by PT    Recommendations for Other Services       Precautions / Restrictions Precautions Precautions: Fall Recall of Precautions/Restrictions: Impaired Precaution/Restrictions Comments: L inattention Restrictions Weight Bearing Restrictions Per Provider Order: No     Mobility  Bed Mobility Overal bed mobility: Needs Assistance Bed Mobility: Sit to Supine       Sit to supine: Mod assist   General bed mobility comments: Assist for LE elevation back into bed    Transfers Overall transfer level: Needs assistance Equipment used: None Transfers: Sit to/from Stand Sit  to Stand: Min assist, +2 physical assistance           General transfer comment: MinA +2 to power up to standing, initial posterior lean    Ambulation/Gait Ambulation/Gait assistance: Min assist, Mod assist, +2 physical assistance Gait Distance (Feet): 100 Feet Assistive device: 2 person hand held assist Gait Pattern/deviations: Step-through pattern, Decreased stride length, Shuffle, Festinating Gait velocity: variable     General Gait Details: Festinating gait pattern room distance, however, out in hallway, pt increasing cadence with 2 person HHA and walking with increased fluidity   Stairs             Wheelchair Mobility     Tilt Bed    Modified Rankin (Stroke Patients Only)       Balance Overall balance assessment: Needs assistance Sitting-balance support: Feet supported Sitting balance-Leahy Scale: Fair     Standing balance support: Bilateral upper extremity supported, During functional activity Standing balance-Leahy Scale: Poor                              Communication Communication Communication: Impaired Factors Affecting Communication: Reduced clarity of speech;Difficulty expressing self  Cognition Arousal: Alert Behavior During Therapy: Flat affect   PT - Cognitive impairments: Sequencing, Problem solving, Attention, Initiation, Awareness, Safety/Judgement                       PT - Cognition Comments: Pleasantly confused, does not recall therapy session yesterday. Pt spouse in room and pt smiling and laughing appropriately Following commands: Impaired Following commands impaired: Follows one step commands with increased time    Cueing Cueing  Techniques: Verbal cues, Gestural cues, Tactile cues  Exercises Other Exercises Other Exercises: Sitting: scapular squeezes x 3    General Comments        Pertinent Vitals/Pain Pain Assessment Pain Assessment: No/denies pain    Home Living                           Prior Function            PT Goals (current goals can now be found in the care plan section) Acute Rehab PT Goals Patient Stated Goal: did not state PT Goal Formulation: With patient Time For Goal Achievement: 09/19/23 Potential to Achieve Goals: Fair Progress towards PT goals: Progressing toward goals    Frequency    Min 2X/week      PT Plan      Co-evaluation              AM-PAC PT 6 Clicks Mobility   Outcome Measure  Help needed turning from your back to your side while in a flat bed without using bedrails?: A Little Help needed moving from lying on your back to sitting on the side of a flat bed without using bedrails?: A Lot Help needed moving to and from a bed to a chair (including a wheelchair)?: A Lot Help needed standing up from a chair using your arms (e.g., wheelchair or bedside chair)?: A Little Help needed to walk in hospital room?: A Lot Help needed climbing 3-5 steps with a railing? : Total 6 Click Score: 13    End of Session Equipment Utilized During Treatment: Gait belt Activity Tolerance: Patient tolerated treatment well Patient left: with call bell/phone within reach;in bed;with bed alarm set Nurse Communication: Mobility status PT Visit Diagnosis: Other abnormalities of gait and mobility (R26.89);Muscle weakness (generalized) (M62.81);Other symptoms and signs involving the nervous system (R29.898)     Time: 8667-8643 PT Time Calculation (min) (ACUTE ONLY): 24 min  Charges:    $Therapeutic Activity: 23-37 mins PT General Charges $$ ACUTE PT VISIT: 1 Visit                     Jacqueline Bonilla, PT, DPT Acute Rehabilitation Services Office [phone]    Jacqueline Bonilla 09/10/2023, 4:06 PM

## 2023-09-10 NOTE — Plan of Care (Signed)
  Problem: Clinical Measurements: Goal: Ability to maintain clinical measurements within normal limits will improve Outcome: Progressing Goal: Will remain free from infection Outcome: Progressing Goal: Diagnostic test results will improve Outcome: Progressing Goal: Respiratory complications will improve Outcome: Progressing Goal: Cardiovascular complication will be avoided Outcome: Progressing   Problem: Activity: Goal: Risk for activity intolerance will decrease Outcome: Progressing   Problem: Nutrition: Goal: Adequate nutrition will be maintained Outcome: Progressing   Problem: Elimination: Goal: Will not experience complications related to bowel motility Outcome: Progressing Goal: Will not experience complications related to urinary retention Outcome: Progressing   Problem: Safety: Goal: Ability to remain free from injury will improve Outcome: Progressing   Problem: Skin Integrity: Goal: Risk for impaired skin integrity will decrease Outcome: Progressing   

## 2023-09-10 NOTE — Progress Notes (Signed)
 PROGRESS NOTE  Jacqueline Bonilla  DOB: 1955-12-26  PCP: Purcell Emil Schanz, MD FMW:[id number]  DOA: 09/03/2023  LOS: 6 days  Hospital Day: 8  Brief narrative: Jacqueline Bonilla is a 68 y.o. female with PMH significant for HTN, stroke with residual right-sided weakness, vascular dementia anemia, anxiety, depression who was recently hospitalized 8/14 to 8/25 for status epilepticus, required mechanical ventilation for 2 days and was ultimately discharged to SNF.  Prior to that hospitalization, patient was on Depakote  which was then switched to Keppra  by neurology. 9/2, patient was brought to the ED as a code stroke with complaint of left-sided weakness, difficulty speaking  Initial CT head unremarkable for acute intracranial abnormality, showed brain atrophy, chronic small vessel ischemic disease and chronic infarcts. Seen by neurology.  On exam, patient had some right gaze deviation which resolved spontaneously, aphasia improved as well. Per neurology, her symptoms could have been likely related to seizures. Keppra  level was sent. Started on Keppra  and Depakote  Admitted to [facility] Long-term EEG monitoring started  Subjective: Patient was seen and examined this morning. Lying on bed.  Not in distress.  Not in pain but depressed and tearful.  States she is frustrated of waiting for SNF.  Assessment and plan: Breakthrough seizures Recent hospitalization for status epilepticus Presented this time with left-sided weakness and difficulty speaking -spontaneously improved in the ED Imagings negative for new stroke Per neurology, symptoms likely secondary to breakthrough seizure Keppra  level was therapeutic. Long-term EEG monitoring was suggestive of cortical dysfunction in the left hemisphere likely secondary to underlying structural abnormality, no seizures were noted. Per neurology recommendation, currently patient is on Keppra  1 g twice daily and Depakote  250 mg twice daily. Has been  tolerating seizure medicines well without any somnolence.  H/o stroke with residual right-sided weakness HLD Continue aspirin  and statin  Essential hypertension Hemodynamically stable without meds  Generalized weakness Impaired mobility Seen by PT.  CIR recommended but did not qualify.   SNF authorization pending   Goals of care   Code Status: Full Code     DVT prophylaxis:  enoxaparin  (LOVENOX ) injection 40 mg Start: 09/04/23 1000   Antimicrobials: None Fluid: None Consultants: Neurology Family Communication: None at bedside  Status: Inpatient Level of care:  Progressive   Patient is from: Home Needs to continue in-hospital care: Medically stable.  Pending SNF authorization    Diet:  Diet Order             Diet Heart Room service appropriate? Yes; Fluid consistency: Thin  Diet effective now                   Scheduled Meds:  aspirin   81 mg Oral Daily   atorvastatin   40 mg Oral Daily   divalproex   250 mg Oral Q12H   enoxaparin  (LOVENOX ) injection  40 mg Subcutaneous Q24H   levETIRAcetam   1,000 mg Oral BID   sodium chloride  flush  3 mL Intravenous Once    PRN meds: acetaminophen  **OR** acetaminophen , mouth rinse   Infusions:    Antimicrobials: Anti-infectives (From admission, onward)    None       Objective: Vitals:   09/10/23 0736 09/10/23 1145  BP: 133/80 113/77  Pulse: 76 80  Resp: 18 16  Temp: 97.8 F (36.6 C) 97.6 F (36.4 C)  SpO2: 98% 100%    Intake/Output Summary (Last 24 hours) at 09/10/2023 1424 Last data filed at 09/10/2023 1300 Gross per 24 hour  Intake 160 ml  Output 1100  ml  Net -940 ml   Filed Weights   09/07/23 1900  Weight: 49.4 kg   Weight change:  Body mass index is 18.68 kg/m.   Physical Exam: General exam: Pleasant, elderly Caucasian female.  Not in physical distress Skin: No rashes, lesions or ulcers. HEENT: Atraumatic, normocephalic, no obvious bleeding Lungs: Clear to auscultation bilaterally,   CVS: S1, S2, no murmur,   GI/Abd: Soft, nontender, nondistended, bowel sound present,   CNS: Alert, awake, oriented x 3.  On exam, residual weakness on the right side from previous stroke noted Psychiatry: Sad affect Extremities: No pedal edema, no calf tenderness,   Data Review: I have personally reviewed the laboratory data and studies available.  F/u labs ordered Unresulted Labs (From admission, onward)     Start     Ordered   09/11/23 0500  Creatinine, serum  (enoxaparin  (LOVENOX )    CrCl >/= 30 ml/min)  Weekly,   R     Comments: while on enoxaparin  therapy    09/04/23 0339   09/04/23 0339  CBC  (enoxaparin  (LOVENOX )    CrCl >/= 30 ml/min)  Once,   R       Comments: Baseline for enoxaparin  therapy IF NOT ALREADY DRAWN.  Notify MD if PLT < 100 K.    09/04/23 0339            Signed, Chapman Rota, MD [facility] 09/10/2023

## 2023-09-11 DIAGNOSIS — R569 Unspecified convulsions: Secondary | ICD-10-CM | POA: Diagnosis not present

## 2023-09-11 LAB — CREATININE, SERUM
Creatinine, Ser: 0.71 mg/dL (ref 0.44–1.00)
GFR, Estimated: >60 mL/min (ref >60)

## 2023-09-11 NOTE — Progress Notes (Signed)
 Occupational Therapy Treatment Patient Details Name: Jacqueline Bonilla MRN: [id number] DOB: [birth year] Today's Date: 09/11/2023   History of present illness Jacqueline Bonilla is a 68 y.o. female who presented with right-sided facial droop and slurred speech. When she arrived in the ED, she began seizing. CT head was negative for any acute processes.  Multiple remote infarcts as well as chronic small vessel disease with parenchymal volume loss was noted. LTM EEG pending. PMH significant of 2 left MCA territory infarcts participating in the [place] trial, loop recorder placement, vascular dementia, sleep apnea, HLD, thrombocytopenia, osteoporosis, HTN, depression, anxiety and anemia   OT comments  Patient received in supine and agreeable to OT treatment. Patient able to get to EOB with min assist due to assistance needed with trunk. Patient asking to use [facility] and required min assist to stand and mod assist for transfer with HHA due to attempting to sit before transfer was complete. Patient stood for toilet hygiene using RW for support with poor use.  Patient able to perform grooming tasks seated at sink for transfer to recliner. Patient performed AROM at BUE shoulder and become more lethargic and was positioned for comfort.  Patient will benefit from continued inpatient follow up therapy, <3 hours/day.   Acute OT to continue to follow to address established goals to facilitate DC to next venue of care.        If plan is discharge home, recommend the following:  Assist for transportation;Assistance with cooking/housework;A lot of help with walking and/or transfers;A lot of help with bathing/dressing/bathroom;Direct supervision/assist for medications management   Equipment Recommendations  None recommended by OT    Recommendations for Other Services      Precautions / Restrictions Precautions Precautions: Fall Recall of Precautions/Restrictions: Impaired Precaution/Restrictions Comments: L  inattention Restrictions Weight Bearing Restrictions Per Provider Order: No       Mobility Bed Mobility Overal bed mobility: Needs Assistance Bed Mobility: Sit to Supine     Supine to sit: Min assist, Used rails, HOB elevated     General bed mobility comments: increased time and use of bed rails to assist    Transfers Overall transfer level: Needs assistance Equipment used: 1 person hand held assist Transfers: Sit to/from Stand, Bed to chair/wheelchair/BSC Sit to Stand: Min assist     Step pivot transfers: Mod assist     General transfer comment: patient able to stand with min assist of one from EOB, BSC, and chair but mod assists for balance during transfers     Balance Overall balance assessment: Needs assistance Sitting-balance support: Feet supported Sitting balance-Leahy Scale: Fair Sitting balance - Comments: EOB   Standing balance support: Single extremity supported, Bilateral upper extremity supported, During functional activity Standing balance-Leahy Scale: Poor Standing balance comment: reliant on RW or therapist for support                           ADL either performed or assessed with clinical judgement   ADL Overall ADL's : Needs assistance/impaired     Grooming: Wash/dry hands;Wash/dry face;Oral care;Brushing hair;Minimal assistance;Sitting Grooming Details (indicate cue type and reason): able to perform oral care and face/hand hygiene but required min assist with combing hair                 Toilet Transfer: Moderate assistance;BSC/3in1 Toilet Transfer Details (indicate cue type and reason): pivot transfer to [facility] with HHA and mod assist for balance Toileting- Clothing Manipulation and Hygiene: Maximal  assistance;Sit to/from stand Toileting - Clothing Manipulation Details (indicate cue type and reason): used RW to assist with balance during toilet hygiene     Functional mobility during ADLs: Moderate assistance General ADL  Comments: increased assistance with balance for transfers and standing ADLs    Extremity/Trunk Assessment              Vision       Perception     Praxis     Communication Communication Communication: Impaired Factors Affecting Communication: Reduced clarity of speech;Difficulty expressing self   Cognition Arousal: Alert Behavior During Therapy: Flat affect Cognition: History of cognitive impairments             OT - Cognition Comments: alert initially but quickly fatigued and became more lethargic at end of session                 Following commands: Impaired Following commands impaired: Follows one step commands with increased time      Cueing   Cueing Techniques: Verbal cues, Gestural cues, Tactile cues  Exercises Exercises: General Upper Extremity General Exercises - Upper Extremity Shoulder Flexion: AROM, Both, 10 reps, Seated    Shoulder Instructions       General Comments      Pertinent Vitals/ Pain       Pain Assessment Pain Assessment: No/denies pain Pain Intervention(s): Monitored during session  Home Living                                          Prior Functioning/Environment              Frequency  Min 2X/week        Progress Toward Goals  OT Goals(current goals can now be found in the care plan section)  Progress towards OT goals: Progressing toward goals  Acute Rehab OT Goals Patient Stated Goal: to go home OT Goal Formulation: With patient Time For Goal Achievement: 09/19/23 Potential to Achieve Goals: Good ADL Goals Pt Will Perform Eating: with modified independence;sitting Pt Will Perform Grooming: with modified independence Pt Will Perform Upper Body Bathing: with min assist;sitting Pt Will Perform Lower Body Bathing: with min assist;sitting/lateral leans Pt Will Perform Upper Body Dressing: with modified independence Pt Will Perform Lower Body Dressing: with supervision Pt Will Transfer  to Toilet: with contact guard assist;stand pivot transfer;bedside commode Additional ADL Goal #1: Pt will sequence thruogh ADL task with supervision A only to demonstrate improved cognition  Plan      Co-evaluation                 AM-PAC OT 6 Clicks Daily Activity     Outcome Measure   Help from another person eating meals?: A Little Help from another person taking care of personal grooming?: A Little Help from another person toileting, which includes using toliet, bedpan, or urinal?: A Lot Help from another person bathing (including washing, rinsing, drying)?: A Lot Help from another person to put on and taking off regular upper body clothing?: A Little Help from another person to put on and taking off regular lower body clothing?: A Lot 6 Click Score: 15    End of Session Equipment Utilized During Treatment: Gait belt  OT Visit Diagnosis: Unsteadiness on feet (R26.81);Other abnormalities of gait and mobility (R26.89);Muscle weakness (generalized) (M62.81);History of falling (Z91.81);Feeding difficulties (R63.3);Hemiplegia and hemiparesis;Other symptoms and signs involving the  nervous system (R29.898);Ataxia, unspecified (R27.0) Hemiplegia - Right/Left: Left Hemiplegia - dominant/non-dominant: Dominant Hemiplegia - caused by: Cerebral infarction   Activity Tolerance Patient limited by fatigue   Patient Left in chair;with call bell/phone within reach;with chair alarm set   Nurse Communication Mobility status        Time: 1010-1038 OT Time Calculation (min): 28 min  Charges: OT General Charges $OT Visit: 1 Visit OT Treatments $Self Care/Home Management : 23-37 mins  Dick Bonilla, OTA Acute Rehabilitation Services  Office [phone]   Jacqueline Bonilla 09/11/2023, 12:52 PM

## 2023-09-11 NOTE — Progress Notes (Signed)
 PROGRESS NOTE  Jacqueline Bonilla  DOB: 1955/06/16  PCP: Purcell Emil Schanz, MD FMW:[id number]  DOA: 09/03/2023  LOS: 7 days  Hospital Day: 9  Brief narrative: Jacqueline Bonilla is a 68 y.o. female with PMH significant for HTN, stroke with residual right-sided weakness, vascular dementia anemia, anxiety, depression who was recently hospitalized 8/14 to 8/25 for status epilepticus, required mechanical ventilation for 2 days and was ultimately discharged to SNF.  Prior to that hospitalization, patient was on Depakote  which was then switched to Keppra  by neurology. 9/2, patient was brought to the ED as a code stroke with complaint of left-sided weakness, difficulty speaking  Initial CT head unremarkable for acute intracranial abnormality, showed brain atrophy, chronic small vessel ischemic disease and chronic infarcts. Seen by neurology.  On exam, patient had some right gaze deviation which resolved spontaneously, aphasia improved as well. Per neurology, her symptoms could have been likely related to seizures. Keppra  level was sent. Started on Keppra  and Depakote  Admitted to [facility] Long-term EEG monitoring started  Subjective: Patient was seen and examined this morning. [place] appeared in no acute distress.  Mood appropriate today  Assessment and plan: Breakthrough seizures Recent hospitalization for status epilepticus Presented this time with left-sided weakness and difficulty speaking -spontaneously improved in the ED Imagings negative for new stroke Per neurology, symptoms likely secondary to breakthrough seizure Keppra  level was therapeutic. Long-term EEG monitoring was suggestive of cortical dysfunction in the left hemisphere likely secondary to underlying structural abnormality, no seizures were noted. Per neurology recommendation, currently patient is on Keppra  1 g twice daily and Depakote  250 mg twice daily. Has been tolerating seizure medicines well without any  somnolence.  H/o stroke with residual right-sided weakness HLD Continue aspirin  and statin  Essential hypertension Hemodynamically stable without meds  Generalized weakness Impaired mobility Seen by PT.  CIR recommended but did not qualify.   SNF authorization pending   Goals of care   Code Status: Full Code     DVT prophylaxis:  enoxaparin  (LOVENOX ) injection 40 mg Start: 09/04/23 1000   Antimicrobials: None Fluid: None Consultants: Neurology Family Communication: None at bedside  Status: Inpatient Level of care:  Progressive   Patient is from: Home Needs to continue in-hospital care: Medically stable.  Pending SNF authorization    Diet:  Diet Order             Diet Heart Room service appropriate? Yes; Fluid consistency: Thin  Diet effective now                   Scheduled Meds:  aspirin   81 mg Oral Daily   atorvastatin   40 mg Oral Daily   divalproex   250 mg Oral Q12H   enoxaparin  (LOVENOX ) injection  40 mg Subcutaneous Q24H   levETIRAcetam   1,000 mg Oral BID   sodium chloride  flush  3 mL Intravenous Once    PRN meds: acetaminophen  **OR** acetaminophen , mouth rinse   Infusions:    Antimicrobials: Anti-infectives (From admission, onward)    None       Objective: Vitals:   09/11/23 0759 09/11/23 1215  BP: 107/64 119/79  Pulse: 66 97  Resp: 18 18  Temp: 98 F (36.7 C) 98.6 F (37 C)  SpO2: 100% 100%    Intake/Output Summary (Last 24 hours) at 09/11/2023 1433 Last data filed at 09/11/2023 0402 Gross per 24 hour  Intake --  Output 700 ml  Net -700 ml   Filed Weights   09/07/23 1900  Weight:  49.4 kg   Weight change:  Body mass index is 18.68 kg/m.   Physical Exam: General exam: Pleasant, elderly Caucasian female.  Not in physical distress Skin: No rashes, lesions or ulcers. HEENT: Atraumatic, normocephalic, no obvious bleeding Lungs: Clear to auscultation bilaterally,  CVS: S1, S2, no murmur,   GI/Abd: Soft, nontender,  nondistended, bowel sound present,   CNS: Alert, awake, oriented x 3.  On exam, residual weakness on the right side from previous stroke noted Psychiatry: Mood appropriate Extremities: No pedal edema, no calf tenderness,   Data Review: I have personally reviewed the laboratory data and studies available.  F/u labs ordered Unresulted Labs (From admission, onward)     Start     Ordered   09/11/23 0500  Creatinine, serum  (enoxaparin  (LOVENOX )    CrCl >/= 30 ml/min)  Weekly,   R     Comments: while on enoxaparin  therapy    09/04/23 0339   09/04/23 0339  CBC  (enoxaparin  (LOVENOX )    CrCl >/= 30 ml/min)  Once,   R       Comments: Baseline for enoxaparin  therapy IF NOT ALREADY DRAWN.  Notify MD if PLT < 100 K.    09/04/23 0339            Signed, Chapman Rota, MD [facility] 09/11/2023

## 2023-09-11 NOTE — Plan of Care (Signed)

## 2023-09-12 DIAGNOSIS — F32A Depression, unspecified: Secondary | ICD-10-CM | POA: Diagnosis not present

## 2023-09-12 DIAGNOSIS — I10 Essential (primary) hypertension: Secondary | ICD-10-CM | POA: Diagnosis not present

## 2023-09-12 DIAGNOSIS — Z7401 Bed confinement status: Secondary | ICD-10-CM | POA: Diagnosis not present

## 2023-09-12 DIAGNOSIS — G459 Transient cerebral ischemic attack, unspecified: Secondary | ICD-10-CM | POA: Diagnosis not present

## 2023-09-12 DIAGNOSIS — R2689 Other abnormalities of gait and mobility: Secondary | ICD-10-CM | POA: Diagnosis not present

## 2023-09-12 DIAGNOSIS — G4089 Other seizures: Secondary | ICD-10-CM | POA: Diagnosis not present

## 2023-09-12 DIAGNOSIS — G40909 Epilepsy, unspecified, not intractable, without status epilepticus: Secondary | ICD-10-CM | POA: Diagnosis not present

## 2023-09-12 DIAGNOSIS — R279 Unspecified lack of coordination: Secondary | ICD-10-CM | POA: Diagnosis not present

## 2023-09-12 DIAGNOSIS — J9601 Acute respiratory failure with hypoxia: Secondary | ICD-10-CM | POA: Diagnosis not present

## 2023-09-12 DIAGNOSIS — D649 Anemia, unspecified: Secondary | ICD-10-CM | POA: Diagnosis not present

## 2023-09-12 DIAGNOSIS — R569 Unspecified convulsions: Secondary | ICD-10-CM | POA: Diagnosis not present

## 2023-09-12 DIAGNOSIS — R531 Weakness: Secondary | ICD-10-CM | POA: Diagnosis not present

## 2023-09-12 DIAGNOSIS — E440 Moderate protein-calorie malnutrition: Secondary | ICD-10-CM | POA: Diagnosis not present

## 2023-09-12 DIAGNOSIS — E785 Hyperlipidemia, unspecified: Secondary | ICD-10-CM | POA: Diagnosis not present

## 2023-09-12 DIAGNOSIS — M6281 Muscle weakness (generalized): Secondary | ICD-10-CM | POA: Diagnosis not present

## 2023-09-12 DIAGNOSIS — I69351 Hemiplegia and hemiparesis following cerebral infarction affecting right dominant side: Secondary | ICD-10-CM | POA: Diagnosis not present

## 2023-09-12 DIAGNOSIS — Z7409 Other reduced mobility: Secondary | ICD-10-CM | POA: Diagnosis not present

## 2023-09-12 NOTE — TOC Progression Note (Signed)
 Transition of Care [facility]) - Progression Note    Patient Details  Name: Jacqueline Bonilla MRN: [id number] Date of Birth: 1955/05/22  [facility] CM/SW Contact  Almarie [name], [name] Phone Number: 09/12/2023, 11:10 AM  Clinical Narrative:   Patient received insurance authorization. CSW updated [name], they no longer have the private room available for patient. CSW contacted spouse, [name], to provide update. Spouse asked for time to review other options and discuss with the patient. CSW spoke with spouse again at the end of the day and they would still like to go to [facility], aware it will be a shared room. Patient can discharge tomorrow.    Expected Discharge Plan: Skilled Nursing Facility Barriers to Discharge: Continued Medical Work up, English as a second language teacher               Expected Discharge Plan and Services     Post Acute Care Choice: Skilled Nursing Facility Living arrangements for the past 2 months: Skilled Nursing Facility, Single Family Home Expected Discharge Date: 09/12/23                                     Social Drivers of Health (SDOH) Interventions SDOH Screenings   Food Insecurity: No Food Insecurity (09/05/2023)  Housing: Low Risk  (09/05/2023)  Transportation Needs: No Transportation Needs (09/05/2023)  Utilities: At Risk (09/05/2023)  Alcohol Screen: Low Risk  (10/23/2022)  Depression (PHQ2-9): Low Risk  (07/24/2023)  Financial Resource Strain: Low Risk  (10/23/2022)  Physical Activity: Sufficiently Active (10/23/2022)  Social Connections: Moderately Isolated (09/05/2023)  Stress: No Stress Concern Present (08/28/2021)  Tobacco Use: Low Risk  (09/04/2023)  Health Literacy: Inadequate Health Literacy (10/23/2022)    Readmission Risk Interventions     No data to display

## 2023-09-12 NOTE — Discharge Summary (Signed)
 Physician Discharge Summary  Jacqueline Bonilla FMW:[id number] DOB: [birth year] DOA: 09/03/2023  PCP: Purcell Emil Schanz, MD  Admit date: 09/03/2023 Discharge date: 09/12/2023  Admitted from: Home Discharge disposition: SNF  Recommendations at discharge:  Ensure compliance to antiseizure medicines.   Brief narrative: Jacqueline Bonilla is a 68 y.o. female with PMH significant for HTN, stroke with residual right-sided weakness, vascular dementia anemia, anxiety, depression who was recently hospitalized 8/14 to 8/25 for status epilepticus, required mechanical ventilation for 2 days and was ultimately discharged to SNF.  Prior to that hospitalization, patient was on Depakote  which was then switched to Keppra  by neurology. 9/2, patient was brought to the ED as a code stroke with complaint of left-sided weakness, difficulty speaking  Initial CT head unremarkable for acute intracranial abnormality, showed brain atrophy, chronic small vessel ischemic disease and chronic infarcts. Seen by neurology.  On exam, patient had some right gaze deviation which resolved spontaneously, aphasia improved as well. Per neurology, her symptoms could have been likely related to seizures. Keppra  level was sent. Started on Keppra  and Depakote  Admitted to [facility] Long-term EEG monitoring started  Subjective: Patient was seen and examined this morning. [place]glide appeared in no acute distress.  Mood appropriate today  Hospital course: Breakthrough seizures Recent hospitalization for status epilepticus Presented this time with left-sided weakness and difficulty speaking -spontaneously improved in the ED Imagings negative for new stroke Per neurology, symptoms likely secondary to breakthrough seizure Keppra  level was therapeutic. Long-term EEG monitoring was suggestive of cortical dysfunction in the left hemisphere likely secondary to underlying structural abnormality, no seizures were noted. Per neurology  recommendation, currently patient is on Keppra  1 g twice daily and Depakote  250 mg twice daily. Has been tolerating seizure medicines well without any somnolence.  H/o stroke with residual right-sided weakness HLD Continue aspirin  and statin  Essential hypertension Hemodynamically stable without meds  Generalized weakness Impaired mobility Seen by PT.  CIR recommended but did not qualify.   SNF disposition today   Goals of care   Code Status: Full Code   Diet:  Diet Order             Diet general           Diet Heart Room service appropriate? Yes; Fluid consistency: Thin  Diet effective now                   Nutritional status:  Body mass index is 18.68 kg/m.       Wounds:  -    Discharge Exam:   Vitals:   09/11/23 2022 09/12/23 0014 09/12/23 0444 09/12/23 0732  BP: 108/61 100/65 111/75 103/76  Pulse: 90 75 85 74  Resp: 16 16 16 19   Temp: 97.6 F (36.4 C) 97.7 F (36.5 C) (!) 97.5 F (36.4 C) 97.6 F (36.4 C)  TempSrc: Oral Oral Oral Oral  SpO2: 98% 98% 99% 97%  Weight:      Height:        Body mass index is 18.68 kg/m.  General exam: Pleasant, elderly Caucasian female.  Not in physical distress Skin: No rashes, lesions or ulcers. HEENT: Atraumatic, normocephalic, no obvious bleeding Lungs: Clear to auscultation bilaterally,  CVS: S1, S2, no murmur,   GI/Abd: Soft, nontender, nondistended, bowel sound present,   CNS: Alert, awake, oriented x 3.  On exam, residual weakness on the right side from previous stroke noted Psychiatry: Mood appropriate Extremities: No pedal edema, no calf tenderness,   Follow ups:  Contact information for follow-up providers     Purcell Emil Schanz, MD Follow up.   Specialty: Internal Medicine Contact information: [address] [name] [address] [phone]              Contact information for after-discharge care     Destination     [name] .   Service: Skilled Nursing Contact  information: [address] [place]  [address] [phone]                     Discharge Instructions:   Discharge Instructions     Call MD for:  difficulty breathing, headache or visual disturbances   Complete by: As directed    Call MD for:  extreme fatigue   Complete by: As directed    Call MD for:  hives   Complete by: As directed    Call MD for:  persistant dizziness or light-headedness   Complete by: As directed    Call MD for:  persistant nausea and vomiting   Complete by: As directed    Call MD for:  severe uncontrolled pain   Complete by: As directed    Call MD for:  temperature >100.4   Complete by: As directed    Diet general   Complete by: As directed    Discharge instructions   Complete by: As directed    Recommendations at discharge:   Ensure compliance to antiseizure medicines.  Seizure precautions: -Per [place] statutes, patients with seizures are not allowed to drive until they have been seizure-free for six months.  -Use caution when using heavy equipment or power tools.  -Avoid working on ladders or at heights.  -Take showers instead of baths. Ensure the water temperature is not too high on the home water heater.  -Do not go swimming alone.  -Do not lock yourself in a room alone (i.e. bathroom). -When caring for infants or small children, sit down when holding, feeding, or changing them to minimize risk of injury to the child in the event you have a seizure.  -Maintain good sleep hygiene. -Avoid alcohol.    If patient has another seizure, call 911 and bring them back to the ED if: A.  The seizure lasts longer than 5 minutes.      B.  The patient doesn't wake shortly after the seizure or has new problems such as difficulty seeing, speaking or moving following the seizure C.  The patient was injured during the seizure D.  The patient has a temperature over 102 F (39C) E.  The patient vomited during the seizure  and now is having trouble breathing      General discharge instructions: Follow with Primary MD Purcell Emil Schanz, MD in 7 days  Please request your PCP  to go over your hospital tests, procedures, radiology results at the follow up. Please get your medicines reviewed and adjusted.  Your PCP may decide to repeat certain labs or tests as needed. Do not drive, operate heavy machinery, perform activities at heights, swimming or participation in water activities or provide baby sitting services if your were admitted for syncope or siezures until you have seen by Primary MD or a Neurologist and advised to do so again.   Controlled Substance Reporting System database was reviewed. Do not drive, operate heavy machinery, perform activities at heights, swim, participate in water activities or provide baby-sitting services while on medications for pain, sleep and mood until your outpatient physician has  reevaluated you and advised to do so again.  You are strongly recommended to comply with the dose, frequency and duration of prescribed medications. Activity: As tolerated with Full fall precautions use walker/cane & assistance as needed Avoid using any recreational substances like cigarette, tobacco, alcohol, or non-prescribed drug. If you experience worsening of your admission symptoms, develop shortness of breath, life threatening emergency, suicidal or homicidal thoughts you must seek medical attention immediately by calling 911 or calling your MD immediately  if symptoms less severe. You must read complete instructions/literature along with all the possible adverse reactions/side effects for all the medicines you take and that have been prescribed to you. Take any new medicine only after you have completely understood and accepted all the possible adverse reactions/side effects.  Wear Seat belts while driving. You were cared for by a hospitalist during your hospital stay. If you have any  questions about your discharge medications or the care you received while you were in the hospital after you are discharged, you can call the unit and ask to speak with the hospitalist or the covering physician. Once you are discharged, your primary care physician will handle any further medical issues. Please note that NO REFILLS for any discharge medications will be authorized once you are discharged, as it is imperative that you return to your primary care physician (or establish a relationship with a primary care physician if you do not have one).   Increase activity slowly   Complete by: As directed        Discharge Medications:   Allergies as of 09/12/2023       Reactions   Iodinated Contrast Media Hives   Iohexol  Hives   Quinolones Hives   Reaction with Cipro, Levaquin, Avelox   Shellfish Allergy Itching        Medication List     TAKE these medications    acetaminophen  325 MG tablet Commonly known as: TYLENOL  Take 650 mg by mouth every 4 (four) hours as needed for moderate pain (pain score 4-6).   aspirin  81 MG chewable tablet Chew 1 tablet (81 mg total) by mouth daily.   atorvastatin  40 MG tablet Commonly known as: LIPITOR Take 1 tablet (40 mg total) by mouth daily.   Cinnamon  500 MG capsule Take 500 mg by mouth at bedtime.   CO Q 10 PO Take 1 capsule by mouth daily.   divalproex  250 MG DR tablet Commonly known as: DEPAKOTE  Take 1 tablet (250 mg total) by mouth every 12 (twelve) hours.   IRON PO Take 365 mg by mouth at bedtime.   levETIRAcetam  1000 MG tablet Commonly known as: KEPPRA  Take 1 tablet (1,000 mg total) by mouth 2 (two) times daily.   LIBREXIA-STROKE milvexian or placebo 25 mg tablet Take 1 tablet by mouth 2 (two) times daily.   multivitamin with minerals Tabs tablet Take 1 tablet by mouth at bedtime.         The results of significant diagnostics from this hospitalization (including imaging, microbiology, ancillary and laboratory)  are listed below for reference.    Procedures and Diagnostic Studies:   DG Pelvis 1-2 Views Result Date: 09/04/2023 EXAM: 1 or 2 VIEW(S) XRAY OF THE PELVIS 09/04/2023 06:38:00 AM COMPARISON: 08/15/2023 CLINICAL HISTORY: 809823 Fall 809823. Reason for exam: fall FINDINGS: BONES AND JOINTS: No acute fracture. No focal osseous lesion. No joint dislocation. SOFT TISSUES: The soft tissues are unremarkable. IMPRESSION: 1. No evidence of acute traumatic injury. Electronically signed by: Evalene Coho MD 09/04/2023  06:45 AM EDT RP Workstation: [id number]   CT HEAD CODE STROKE WO CONTRAST Result Date: 09/03/2023 CLINICAL DATA:  Code stroke. Neuro deficit, acute, stroke suspected. Right-sided gaze. Right arm weakness. History of stroke. EXAM: CT HEAD WITHOUT CONTRAST TECHNIQUE: Contiguous axial images were obtained from the base of the skull through the vertex without intravenous contrast. RADIATION DOSE REDUCTION: This exam was performed according to the departmental dose-optimization program which includes automated exposure control, adjustment of the mA and/or kV according to patient size and/or use of iterative reconstruction technique. COMPARISON:  Brain MRI 08/16/2023. FINDINGS: Brain: Cerebral atrophy. Prominence of the ventricles and sulci, which appears commensurate. Patchy chronic cortical/subcortical infarcts within the bilateral frontal, parietal and occipital lobes, not appreciably changed from the prior brain MRI of 08/16/2023. Chronic lacunar infarcts within the bilateral deep gray nuclei, and small chronic infarcts within the bilateral cerebellar hemispheres, also not appreciably changed. Background moderate patchy and ill-defined hypoattenuation within the cerebral white matter, nonspecific but compatible with chronic small vessel ischemic disease. There is no acute intracranial hemorrhage. No acute demarcated cortical infarct. No extra-axial fluid collection. No evidence of an intracranial mass. No  midline shift. Vascular: No hyperdense vessel.  Atherosclerotic calcifications. Skull: No calvarial fracture or aggressive osseous lesion. Sinuses/Orbits: No mass or acute finding within the imaged orbits. No significant paranasal sinus disease at the imaged levels. ASPECTS [facility] Stroke Program Early CT Score) - Ganglionic level infarction (caudate, lentiform nuclei, internal capsule, insula, M1-M3 cortex): 7 - Supraganglionic infarction (M4-M6 cortex): 3 Total score (0-10 with 10 being normal): 10 No evidence of an acute intracranial abnormality. These results were communicated to Dr. Sallyann at 4:25 pmon 9/2/2025by text page via the [facility] messaging system. IMPRESSION: 1. No evidence of an acute intracranial abnormality. 2. Parenchymal atrophy, chronic small vessel ischemic disease and unchanged chronic infarcts, as described. Electronically Signed   By: Rockey Childs D.O.   On: 09/03/2023 16:25     Labs:   Basic Metabolic Panel: Recent Labs  Lab 09/11/23 0459  CREATININE 0.71   GFR Estimated Creatinine Clearance: 51 mL/min (by C-G formula based on SCr of 0.71 mg/dL). Liver Function Tests: No results for input(s): AST, ALT, ALKPHOS, BILITOT, PROT, ALBUMIN in the last 168 hours. No results for input(s): LIPASE, AMYLASE in the last 168 hours. No results for input(s): AMMONIA in the last 168 hours. Coagulation profile No results for input(s): INR, PROTIME in the last 168 hours.  CBC: No results for input(s): WBC, NEUTROABS, HGB, HCT, MCV, PLT in the last 168 hours. Cardiac Enzymes: No results for input(s): CKTOTAL, CKMB, CKMBINDEX, TROPONINI in the last 168 hours. BNP: Invalid input(s): POCBNP CBG: No results for input(s): GLUCAP in the last 168 hours. D-Dimer No results for input(s): DDIMER in the last 72 hours. Hgb A1c No results for input(s): HGBA1C in the last 72 hours. Lipid Profile No results for input(s): CHOL, HDL, LDLCALC,  TRIG, CHOLHDL, LDLDIRECT in the last 72 hours. Thyroid  function studies No results for input(s): TSH, T4TOTAL, T3FREE, THYROIDAB in the last 72 hours.  Invalid input(s): FREET3 Anemia work up No results for input(s): VITAMINB12, FOLATE, FERRITIN, TIBC, IRON, RETICCTPCT in the last 72 hours. Microbiology No results found for this or any previous visit (from the past 240 hours).  Time coordinating discharge: 45 minutes  Signed: Romir Klimowicz  [facility] 09/12/2023, 9:35 AM

## 2023-09-12 NOTE — Progress Notes (Signed)
 Physical Therapy Treatment Patient Details Name: Jacqueline Bonilla MRN: [id number] DOB: October 17, 1955 Today's Date: 09/12/2023   History of Present Illness Jacqueline Bonilla is a 68 y.o. female who presented with right-sided facial droop and slurred speech. When she arrived in the ED, she began seizing. CT head was negative for any acute processes.  Multiple remote infarcts as well as chronic small vessel disease with parenchymal volume loss was noted. LTM EEG pending. PMH significant of 2 left MCA territory infarcts participating in the [place] trial, loop recorder placement, vascular dementia, sleep apnea, HLD, thrombocytopenia, osteoporosis, HTN, depression, anxiety and anemia    PT Comments  Pt received in the recliner and agreeable to session. Pt requires increased assist for mobility tasks this session due to fatigue. Pt demonstrates festination and posterior lean with B feet sliding forward requiring max A +2 to maintain balance and guide pt to EOB. Unable to progress gait this session due to fatigue and poor balance. Pt able to lateral scoot towards HOB with mod A. Pt continues to benefit from PT services to progress toward functional mobility goals.    If plan is discharge home, recommend the following: Two people to help with walking and/or transfers;Two people to help with bathing/dressing/bathroom   Can travel by private vehicle     No  Equipment Recommendations  None recommended by PT    Recommendations for Other Services       Precautions / Restrictions Precautions Precautions: Fall Recall of Precautions/Restrictions: Impaired Precaution/Restrictions Comments: L inattention Restrictions Weight Bearing Restrictions Per Provider Order: No     Mobility  Bed Mobility Overal bed mobility: Needs Assistance Bed Mobility: Sit to Supine     Supine to sit: Min assist, Used rails, HOB elevated, +2 for physical assistance     General bed mobility comments: Min A for trunk  guidance and BLE elevation to EOB    Transfers Overall transfer level: Needs assistance Equipment used: 2 person hand held assist Transfers: Sit to/from Stand, Bed to chair/wheelchair/BSC Sit to Stand: Mod assist, +2 physical assistance   Step pivot transfers: Max assist, +2 physical assistance       General transfer comment: Pt with BLE extension with feet sliding forward during standing and step pivot transfers. Pt demonstrates festination and decreased coordination during steps, requiring max A +2 for stability and guidance to EOB to prevent LOB    Ambulation/Gait                   Stairs             Wheelchair Mobility     Tilt Bed    Modified Rankin (Stroke Patients Only)       Balance Overall balance assessment: Needs assistance Sitting-balance support: Feet supported Sitting balance-Leahy Scale: Fair Sitting balance - Comments: EOB   Standing balance support: Single extremity supported, Bilateral upper extremity supported, During functional activity, Reliant on assistive device for balance Standing balance-Leahy Scale: Poor Standing balance comment: reliant on external support                            Communication Communication Communication: Impaired Factors Affecting Communication: Reduced clarity of speech;Difficulty expressing self  Cognition Arousal: Alert Behavior During Therapy: Flat affect   PT - Cognitive impairments: Sequencing, Problem solving, Attention, Initiation, Awareness, Safety/Judgement  Following commands: Impaired Following commands impaired: Follows one step commands with increased time    Cueing Cueing Techniques: Verbal cues, Gestural cues, Tactile cues  Exercises      General Comments        Pertinent Vitals/Pain Pain Assessment Pain Assessment: 0-10 Pain Score: 2  Pain Location: right wrist Pain Descriptors / Indicators: Sore Pain Intervention(s):  Monitored during session     PT Goals (current goals can now be found in the care plan section) Acute Rehab PT Goals Patient Stated Goal: did not state PT Goal Formulation: With patient Time For Goal Achievement: 09/19/23 Progress towards PT goals: Progressing toward goals    Frequency    Min 2X/week          AM-PAC PT 6 Clicks Mobility   Outcome Measure  Help needed turning from your back to your side while in a flat bed without using bedrails?: A Little Help needed moving from lying on your back to sitting on the side of a flat bed without using bedrails?: A Lot Help needed moving to and from a bed to a chair (including a wheelchair)?: A Lot Help needed standing up from a chair using your arms (e.g., wheelchair or bedside chair)?: A Lot Help needed to walk in hospital room?: Total Help needed climbing 3-5 steps with a railing? : Total 6 Click Score: 11    End of Session Equipment Utilized During Treatment: Gait belt Activity Tolerance: Patient limited by fatigue Patient left: with call bell/phone within reach;in bed;with bed alarm set Nurse Communication: Mobility status PT Visit Diagnosis: Other abnormalities of gait and mobility (R26.89);Muscle weakness (generalized) (M62.81);Other symptoms and signs involving the nervous system (R29.898)     Time: 8865-8853 PT Time Calculation (min) (ACUTE ONLY): 12 min  Charges:    $Therapeutic Activity: 8-22 mins PT General Charges $$ ACUTE PT VISIT: 1 Visit                    Darryle George, PTA Acute Rehabilitation Services Secure Chat Preferred  [phone]    Darryle George 09/12/2023, 1:29 PM

## 2023-09-12 NOTE — Progress Notes (Signed)
 DISCHARGE NOTE SNF [name] to be discharged Skilled nursing facility per MD order. Patient verbalized understanding.  Skin clean, dry and intact without evidence of skin break down, no evidence of skin tears noted. IV catheter discontinued intact. Site without signs and symptoms of complications. Dressing and pressure applied. Pt denies pain at the site currently. No complaints noted.  Patient free of lines, drains, and wounds.   Discharge packet assembled. An After Visit Summary (AVS) was printed and given to the EMS personnel. Patient escorted via stretcher and discharged to [facility] via ambulance. Report called to accepting facility; all questions and concerns addressed.   [name], RN

## 2023-09-12 NOTE — TOC Progression Note (Signed)
 Transition of Care [facility]) - Progression Note    Patient Details  Name: MANYA BALASH MRN: [id number] Date of Birth: 01-Jul-1955  [facility]) CM/SW Contact  Almarie [name], [name] Phone Number: 09/12/2023, 11:10 AM  Clinical Narrative:   CSW following for disposition. Insurance authorization remains pending at this time. CSW to follow.    Expected Discharge Plan: Skilled Nursing Facility Barriers to Discharge: Continued Medical Work up, English as a second language teacher               Expected Discharge Plan and Services     Post Acute Care Choice: Skilled Nursing Facility Living arrangements for the past 2 months: Skilled Nursing Facility, Single Family Home Expected Discharge Date: 09/12/23                                     Social Drivers of Health (SDOH) Interventions SDOH Screenings   Food Insecurity: No Food Insecurity (09/05/2023)  Housing: Low Risk  (09/05/2023)  Transportation Needs: No Transportation Needs (09/05/2023)  Utilities: At Risk (09/05/2023)  Alcohol Screen: Low Risk  (10/23/2022)  Depression (PHQ2-9): Low Risk  (07/24/2023)  Financial Resource Strain: Low Risk  (10/23/2022)  Physical Activity: Sufficiently Active (10/23/2022)  Social Connections: Moderately Isolated (09/05/2023)  Stress: No Stress Concern Present (08/28/2021)  Tobacco Use: Low Risk  (09/04/2023)  Health Literacy: Inadequate Health Literacy (10/23/2022)    Readmission Risk Interventions     No data to display

## 2023-09-12 NOTE — TOC Transition Note (Signed)
 Transition of Care [facility]) - Discharge Note   Patient Details  Name: Jacqueline Bonilla MRN: [id number] Date of Birth: [birth year]  [facility] of [facility]) CM/SW Contact:  Almarie [name], LCSW Phone Number: 09/12/2023, 11:12 AM   Clinical Narrative:   CSW confirmed with [facility] that they are ready for patient to admit, and spoke with spouse, [name], he is in agreement. Discharge information sent to [facility], transport arranged with [facility] for next available.  Nurse to call report to [phone], Room 609a.    Final next level of care: Skilled Nursing Facility Barriers to Discharge: Barriers Resolved   Patient Goals and CMS Choice Patient states their goals for this hospitalization and ongoing recovery are:: get rehab and get back home CMS Medicare.gov Compare Post Acute Care list provided to:: Patient Represenative (must comment) Choice offered to / list presented to : Spouse [place] ownership interest in [facility].provided to:: Spouse    Discharge Placement              Patient chooses bed at: [name] Patient to be transferred to facility by: [facility] Name of family member notified: [name] Patient and family notified of of transfer: 09/12/23  Discharge Plan and Services Additional resources added to the After Visit Summary for       Post Acute Care Choice: Skilled Nursing Facility                               Social Drivers of Health (SDOH) Interventions SDOH Screenings   Food Insecurity: No Food Insecurity (09/05/2023)  Housing: Low Risk  (09/05/2023)  Transportation Needs: No Transportation Needs (09/05/2023)  Utilities: At Risk (09/05/2023)  Alcohol Screen: Low Risk  (10/23/2022)  Depression (PHQ2-9): Low Risk  (07/24/2023)  Financial Resource Strain: Low Risk  (10/23/2022)  Physical Activity: Sufficiently Active (10/23/2022)  Social Connections: Moderately Isolated (09/05/2023)  Stress: No Stress Concern Present (08/28/2021)  Tobacco Use:  Low Risk  (09/04/2023)  Health Literacy: Inadequate Health Literacy (10/23/2022)     Readmission Risk Interventions     No data to display

## 2023-09-17 NOTE — Progress Notes (Deleted)
 Subjective:    Patient ID: Jacqueline Bonilla, female    DOB: [birth year], 68 y.o.   MRN: [id number]  HPI  Pain Inventory Average Pain {NUMBERS; 0-10:5044} Pain Right Now {NUMBERS; 0-10:5044} My pain is {PAIN DESCRIPTION:21022940}  In the last 24 hours, has pain interfered with the following? General activity {NUMBERS; 0-10:5044} Relation with others {NUMBERS; 0-10:5044} Enjoyment of life {NUMBERS; 0-10:5044} What TIME of day is your pain at its worst? {time of day:24191} Sleep (in general) {BHH GOOD/FAIR/POOR:22877}  Pain is worse with: {ACTIVITIES:21022942} Pain improves with: {PAIN IMPROVES TPUY:78977056} Relief from Meds: {NUMBERS; 0-10:5044}  Family History  Problem Relation Age of Onset   Cancer Father        non hodgkin lymphoma & skin   Heart attack Maternal Grandfather    Dementia Mother    Polymyositis Sister    Social History   Socioeconomic History   Marital status: Married    Spouse name: Actor   Number of children: 1   Years of education: Not on file   Highest education level: Not on file  Occupational History   Occupation: Audiological scientist   Occupation: Retired  Tobacco Use   Smoking status: Never   Smokeless tobacco: Never  Vaping Use   Vaping status: Never Used  Substance and Sexual Activity   Alcohol use: Never   Drug use: Never   Sexual activity: Not Currently    Partners: Male    Comment: husband vasectomy  Other Topics Concern   Not on file  Social History Narrative   Lives with husband   Grandchildren - 1   Works - Biochemist, 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%   Gun in home - yes - secured      Right handed   Caffeine: maybe tea every now and then   Social Drivers of Corporate investment banker Strain: Low Risk  (10/23/2022)   Overall Financial Resource Strain (CARDIA)    Difficulty of Paying Living Expenses: Not hard at all  Food Insecurity: No Food Insecurity (09/05/2023)   Hunger Vital Sign    Worried About Running Out of Food in the Last  Year: Never true    Ran Out of Food in the Last Year: Never true  Transportation Needs: No Transportation Needs (09/05/2023)   PRAPARE - Administrator, Civil Service (Medical): No    Lack of Transportation (Non-Medical): No  Physical Activity: Sufficiently Active (10/23/2022)   Exercise Vital Sign    Days of Exercise per Week: 6 days    Minutes of Exercise per Session: 40 min  Stress: No Stress Concern Present (08/28/2021)   [employer] of Occupational Health - Occupational Stress Questionnaire    Feeling of Stress : Not at all  Social Connections: Moderately Isolated (09/05/2023)   Social Connection and Isolation Panel    Frequency of Communication with Friends and Family: Once a week    Frequency of Social Gatherings with Friends and Family: Once a week    Attends Religious Services: Never    Database administrator or Organizations: Yes    Attends Banker Meetings: Never    Marital Status: Married   Past Surgical History:  Procedure Laterality Date   BREAST BIOPSY     BUBBLE STUDY  12/08/2020   Procedure: BUBBLE STUDY;  Surgeon: Delford Maude BROCKS, MD;  Location: [facility] ENDOSCOPY;  Service: Cardiovascular;;   CESAREAN SECTION     hysteroscopic resection     implantable  loop recorder implant  10/21/2019   Medtronic Reveal Linq model LNQ 22 (SN [id number]) implantable loop recorder   IR CT HEAD LTD  12/01/2020   IR PERCUTANEOUS ART THROMBECTOMY/INFUSION INTRACRANIAL INC DIAG ANGIO  12/01/2020   IR US  GUIDE VASC ACCESS RIGHT  12/01/2020   ORIF HUMERUS FRACTURE Left 07/31/2021   Procedure: OPEN REDUCTION INTERNAL FIXATION (ORIF) DISTAL HUMERUS FRACTURE;  Surgeon: Kendal Franky SQUIBB, MD;  Location: [facility] OR;  Service: Orthopedics;  Laterality: Left;   RADIOLOGY WITH ANESTHESIA N/A 12/01/2020   Procedure: IR WITH ANESTHESIA - CODE STROKE;  Surgeon: Radiologist, Medication, MD;  Location: [facility] OR;  Service: Radiology;  Laterality: N/A;   TEE WITHOUT CARDIOVERSION N/A 12/08/2020    Procedure: TRANSESOPHAGEAL ECHOCARDIOGRAM (TEE);  Surgeon: Delford Maude BROCKS, MD;  Location: [facility] ENDOSCOPY;  Service: Cardiovascular;  Laterality: N/A;   Past Surgical History:  Procedure Laterality Date   BREAST BIOPSY     BUBBLE STUDY  12/08/2020   Procedure: BUBBLE STUDY;  Surgeon: Delford Maude BROCKS, MD;  Location: [facility] ENDOSCOPY;  Service: Cardiovascular;;   CESAREAN SECTION     hysteroscopic resection     implantable loop recorder implant  10/21/2019   Medtronic Reveal Linq model LNQ 22 (SN [id number]) implantable loop recorder   IR CT HEAD LTD  12/01/2020   IR PERCUTANEOUS ART THROMBECTOMY/INFUSION INTRACRANIAL INC DIAG ANGIO  12/01/2020   IR US  GUIDE VASC ACCESS RIGHT  12/01/2020   ORIF HUMERUS FRACTURE Left 07/31/2021   Procedure: OPEN REDUCTION INTERNAL FIXATION (ORIF) DISTAL HUMERUS FRACTURE;  Surgeon: Kendal Franky SQUIBB, MD;  [facility] OR;  Service: Orthopedics;  Laterality: Left;   RADIOLOGY WITH ANESTHESIA N/A 12/01/2020   Procedure: IR WITH ANESTHESIA - CODE STROKE;  Surgeon: Radiologist, Medication, MD;  Location: [facility] OR;  Service: Radiology;  Laterality: N/A;   TEE WITHOUT CARDIOVERSION N/A 12/08/2020   Procedure: TRANSESOPHAGEAL ECHOCARDIOGRAM (TEE);  Surgeon: Delford Maude BROCKS, MD;  Location: [facility] ENDOSCOPY;  Service: Cardiovascular;  Laterality: N/A;   Past Medical History:  Diagnosis Date   Acute ischemic left MCA stroke (HCC) 04/01/2023   right hemiparesis   Anxiety    Cognitive impairment    Depression    Dyslipidemia    Dysmenorrhea    Endometriosis    Fibroid    Frequent falls    Gait instability    Hypertension    Implantable loop recorder present 2021   Normocytic anemia    Osteoporosis    Seizure disorder (HCC)    Statin intolerance    Tachycardia    Vascular dementia (HCC)    LMP 01/02/2003   Opioid Risk Score:   Fall Risk Score:  `1  Depression screen [facility] 2/9     07/24/2023    2:06 PM 07/16/2023   10:46 AM 04/23/2023    1:56 PM 04/17/2023   10:18 AM  02/11/2023    1:04 PM 04/24/2022    9:22 AM 01/23/2022    2:04 PM  Depression screen PHQ 2/9  Decreased Interest 0 0 0 0 0 0 0  Down, Depressed, Hopeless 0 0 0 0 0 0 0  PHQ - 2 Score 0 0 0 0 0 0 0  Altered sleeping   0 0     Tired, decreased energy   1 3     Change in appetite   0 0     Feeling bad or failure about yourself    0 0     Trouble concentrating   0 0  Moving slowly or fidgety/restless   0 3     Suicidal thoughts   0 0     PHQ-9 Score   1 6     Difficult doing work/chores    Somewhat difficult        Review of Systems     Objective:   Physical Exam        Assessment & Plan:

## 2023-09-19 DIAGNOSIS — D649 Anemia, unspecified: Secondary | ICD-10-CM | POA: Diagnosis not present

## 2023-09-19 DIAGNOSIS — E785 Hyperlipidemia, unspecified: Secondary | ICD-10-CM | POA: Diagnosis not present

## 2023-09-19 DIAGNOSIS — G4089 Other seizures: Secondary | ICD-10-CM | POA: Diagnosis not present

## 2023-09-19 DIAGNOSIS — G459 Transient cerebral ischemic attack, unspecified: Secondary | ICD-10-CM | POA: Diagnosis not present

## 2023-09-20 ENCOUNTER — Encounter: Attending: Physical Medicine & Rehabilitation | Admitting: Physical Medicine & Rehabilitation

## 2023-09-20 DIAGNOSIS — R269 Unspecified abnormalities of gait and mobility: Secondary | ICD-10-CM | POA: Insufficient documentation

## 2023-09-20 DIAGNOSIS — I69398 Other sequelae of cerebral infarction: Secondary | ICD-10-CM | POA: Insufficient documentation

## 2023-09-20 DIAGNOSIS — I69390 Apraxia following cerebral infarction: Secondary | ICD-10-CM | POA: Insufficient documentation

## 2023-09-23 ENCOUNTER — Encounter

## 2023-09-24 NOTE — Progress Notes (Signed)
 Remote Loop Recorder Transmission

## 2023-10-04 DIAGNOSIS — R2689 Other abnormalities of gait and mobility: Secondary | ICD-10-CM | POA: Diagnosis not present

## 2023-10-04 DIAGNOSIS — M6281 Muscle weakness (generalized): Secondary | ICD-10-CM | POA: Diagnosis not present

## 2023-10-04 DIAGNOSIS — R41841 Cognitive communication deficit: Secondary | ICD-10-CM | POA: Diagnosis not present

## 2023-10-04 DIAGNOSIS — R278 Other lack of coordination: Secondary | ICD-10-CM | POA: Diagnosis not present

## 2023-10-04 DIAGNOSIS — G9389 Other specified disorders of brain: Secondary | ICD-10-CM | POA: Diagnosis not present

## 2023-10-04 NOTE — Progress Notes (Signed)
 Remote Loop Recorder Transmission

## 2023-10-06 DIAGNOSIS — R278 Other lack of coordination: Secondary | ICD-10-CM | POA: Diagnosis not present

## 2023-10-06 DIAGNOSIS — G9389 Other specified disorders of brain: Secondary | ICD-10-CM | POA: Diagnosis not present

## 2023-10-06 DIAGNOSIS — M6281 Muscle weakness (generalized): Secondary | ICD-10-CM | POA: Diagnosis not present

## 2023-10-06 DIAGNOSIS — R41841 Cognitive communication deficit: Secondary | ICD-10-CM | POA: Diagnosis not present

## 2023-10-06 DIAGNOSIS — R2689 Other abnormalities of gait and mobility: Secondary | ICD-10-CM | POA: Diagnosis not present

## 2023-10-07 DIAGNOSIS — R41841 Cognitive communication deficit: Secondary | ICD-10-CM | POA: Diagnosis not present

## 2023-10-07 DIAGNOSIS — R278 Other lack of coordination: Secondary | ICD-10-CM | POA: Diagnosis not present

## 2023-10-07 DIAGNOSIS — R2689 Other abnormalities of gait and mobility: Secondary | ICD-10-CM | POA: Diagnosis not present

## 2023-10-07 DIAGNOSIS — G9389 Other specified disorders of brain: Secondary | ICD-10-CM | POA: Diagnosis not present

## 2023-10-07 DIAGNOSIS — M6281 Muscle weakness (generalized): Secondary | ICD-10-CM | POA: Diagnosis not present

## 2023-10-08 DIAGNOSIS — R2689 Other abnormalities of gait and mobility: Secondary | ICD-10-CM | POA: Diagnosis not present

## 2023-10-08 DIAGNOSIS — G9389 Other specified disorders of brain: Secondary | ICD-10-CM | POA: Diagnosis not present

## 2023-10-08 DIAGNOSIS — R278 Other lack of coordination: Secondary | ICD-10-CM | POA: Diagnosis not present

## 2023-10-08 DIAGNOSIS — R41841 Cognitive communication deficit: Secondary | ICD-10-CM | POA: Diagnosis not present

## 2023-10-08 DIAGNOSIS — M6281 Muscle weakness (generalized): Secondary | ICD-10-CM | POA: Diagnosis not present

## 2023-10-09 DIAGNOSIS — R278 Other lack of coordination: Secondary | ICD-10-CM | POA: Diagnosis not present

## 2023-10-09 DIAGNOSIS — G9389 Other specified disorders of brain: Secondary | ICD-10-CM | POA: Diagnosis not present

## 2023-10-09 DIAGNOSIS — R41841 Cognitive communication deficit: Secondary | ICD-10-CM | POA: Diagnosis not present

## 2023-10-09 DIAGNOSIS — M6281 Muscle weakness (generalized): Secondary | ICD-10-CM | POA: Diagnosis not present

## 2023-10-09 DIAGNOSIS — R2689 Other abnormalities of gait and mobility: Secondary | ICD-10-CM | POA: Diagnosis not present

## 2023-10-10 ENCOUNTER — Ambulatory Visit

## 2023-10-10 VITALS — Ht 64.0 in | Wt 118.0 lb

## 2023-10-10 DIAGNOSIS — Z0000 Encounter for general adult medical examination without abnormal findings: Secondary | ICD-10-CM | POA: Diagnosis not present

## 2023-10-10 DIAGNOSIS — G9389 Other specified disorders of brain: Secondary | ICD-10-CM | POA: Diagnosis not present

## 2023-10-10 DIAGNOSIS — R2689 Other abnormalities of gait and mobility: Secondary | ICD-10-CM | POA: Diagnosis not present

## 2023-10-10 DIAGNOSIS — M6281 Muscle weakness (generalized): Secondary | ICD-10-CM | POA: Diagnosis not present

## 2023-10-10 DIAGNOSIS — R278 Other lack of coordination: Secondary | ICD-10-CM | POA: Diagnosis not present

## 2023-10-10 DIAGNOSIS — R41841 Cognitive communication deficit: Secondary | ICD-10-CM | POA: Diagnosis not present

## 2023-10-10 NOTE — Patient Instructions (Addendum)
 Jacqueline Bonilla,  Thank you for taking the time for your Medicare Wellness Visit. I appreciate your continued commitment to your health goals. Please review the care plan we discussed, and feel free to reach out if I can assist you further.  Medicare recommends these wellness visits once per year to help you and your care team stay ahead of potential health issues. These visits are designed to focus on prevention, allowing your provider to concentrate on managing your acute and chronic conditions during your regular appointments.  Please note that Annual Wellness Visits do not include a physical exam. Some assessments may be limited, especially if the visit was conducted virtually. If needed, we may recommend a separate in-person follow-up with your provider.  Ongoing Care Seeing your primary care provider every 3 to 6 months helps us  monitor your health and provide consistent, personalized care.  Referrals If a referral was made during today's visit and you haven't received any updates within two weeks, please contact the referred provider directly to check on the status.  Recommended Screenings:  Health Maintenance  Topic Date Due   Zoster (Shingles) Vaccine (1 of 2) Never done   Cologuard (Stool DNA test)  11/12/2022   Flu Shot  Never done   Pneumococcal Vaccine for age over 8 (1 of 1 - PCV) 12/23/2023*   Breast Cancer Screening  02/22/2024   Medicare Annual Wellness Visit  10/09/2024   DTaP/Tdap/Td vaccine (3 - Td or Tdap) 07/30/2031   DEXA scan (bone density measurement)  Completed   Hepatitis C Screening  Completed   Meningitis B Vaccine  Aged Out   COVID-19 Vaccine  Discontinued  *Topic was postponed. The date shown is not the original due date.       10/10/2023   12:34 PM  Advanced Directives  Does Patient Have a Medical Advance Directive? Yes  Type of Estate agent of [place];Living will  Copy of Healthcare Power of Attorney in Chart? No - copy  requested  Would patient like information on creating a medical advance directive? No - Patient declined   Advance Care Planning is important because it: Ensures you receive medical care that aligns with your values, goals, and preferences. Provides guidance to your family and loved ones, reducing the emotional burden of decision-making during critical moments.  Vision: Annual vision screenings are recommended for early detection of glaucoma, cataracts, and diabetic retinopathy. These exams can also reveal signs of chronic conditions such as diabetes and high blood pressure.  Dental: Annual dental screenings help detect early signs of oral cancer, gum disease, and other conditions linked to overall health, including heart disease and diabetes.  Please see the attached documents for additional preventive care recommendations.

## 2023-10-10 NOTE — Progress Notes (Signed)
 Subjective:   Jacqueline Bonilla is a 68 y.o. who presents for a Medicare Wellness preventive visit.  As a reminder, Annual Wellness Visits don't include a physical exam, and some assessments may be limited, especially if this visit is performed virtually. We may recommend an in-person follow-up visit with your provider if needed.  Visit Complete: Virtual I connected with  Jacqueline Bonilla on 10/10/23 by a audio enabled telemedicine application and verified that I am speaking with the correct person using two identifiers.  Patient Location: Skilled Nursing Facility - [name]  Provider Location: Office/Clinic  I discussed the limitations of evaluation and management by telemedicine. The patient expressed understanding and agreed to proceed.  Vital Signs: Because this visit was a virtual/telehealth visit, some criteria may be missing or patient reported. Any vitals not documented were not able to be obtained and vitals that have been documented are patient reported.  VideoDeclined- This patient declined Librarian, academic. Therefore the visit was completed with audio only.  Persons Participating in Visit: Patient assisted by Spouse, [name] Bonilla.  AWV Questionnaire: No: Patient Medicare AWV questionnaire was not completed prior to this visit.  Cardiac Risk Factors include: advanced age (>74men, >63 women);dyslipidemia;hypertension;Other (see comment), Risk factor comments: CVA     Objective:    Today's Vitals   10/10/23 1235  Weight: 118 lb (53.5 kg)  Height: 5' 4 (1.626 m)   Body mass index is 20.25 kg/m.     10/10/2023   12:34 PM 09/04/2023    7:18 AM 08/21/2023    3:52 PM 07/02/2023    8:40 AM 06/30/2023    3:45 PM 06/03/2023    7:03 PM 04/01/2023    3:00 PM  Advanced Directives  Does Patient Have a Medical Advance Directive? Yes No No Yes No Yes Yes  Type of Estate agent of [place];Living will   Healthcare Power of  [place];Living will  Healthcare Power of [employer] Power of Attorney  Does patient want to make changes to medical advance directive?    No - Patient declined  No - Patient declined No - Patient declined  Copy of Healthcare Power of Attorney in Chart? No - copy requested   No - copy requested  No - copy requested   Would patient like information on creating a medical advance directive? No - Patient declined No - Patient declined No - Patient declined        Current Medications (verified) Outpatient Encounter Medications as of 10/10/2023  Medication Sig   acetaminophen  (TYLENOL ) 325 MG tablet Take 650 mg by mouth every 4 (four) hours as needed for moderate pain (pain score 4-6).   aspirin  81 MG chewable tablet Chew 1 tablet (81 mg total) by mouth daily.   atorvastatin  (LIPITOR) 40 MG tablet Take 1 tablet (40 mg total) by mouth daily.   Cinnamon  500 MG capsule Take 500 mg by mouth at bedtime.   Coenzyme Q10 (CO Q 10 PO) Take 1 capsule by mouth daily.   divalproex  (DEPAKOTE ) 250 MG DR tablet Take 1 tablet (250 mg total) by mouth every 12 (twelve) hours.   Ferrous Sulfate (IRON PO) Take 365 mg by mouth at bedtime.   levETIRAcetam  (KEPPRA ) 1000 MG tablet Take 1 tablet (1,000 mg total) by mouth 2 (two) times daily.   Multiple Vitamin (MULTIVITAMIN WITH MINERALS) TABS tablet Take 1 tablet by mouth at bedtime.   Study - LIBREXIA-STROKE - milvexian 25 mg or placebo tablet (PI-Sethi) Take 1  tablet by mouth 2 (two) times daily. (Patient not taking: Reported on 10/10/2023)   No facility-administered encounter medications on file as of 10/10/2023.    Allergies (verified) Iodinated contrast media, Iohexol , Quinolones, and Shellfish allergy   History: Past Medical History:  Diagnosis Date   Acute ischemic left MCA stroke (HCC) 04/01/2023   right hemiparesis   Anxiety    Cognitive impairment    Depression    Dyslipidemia    Dysmenorrhea    Endometriosis    Fibroid    Frequent falls     Gait instability    Hypertension    Implantable loop recorder present 2021   Normocytic anemia    Osteoporosis    Seizure disorder (HCC)    Statin intolerance    Tachycardia    Vascular dementia [facility])    Past Surgical History:  Procedure Laterality Date   BREAST BIOPSY     BUBBLE STUDY  12/08/2020   Procedure: BUBBLE STUDY;  Surgeon: Delford Maude BROCKS, MD;  Location: [facility] ENDOSCOPY;  Service: Cardiovascular;;   CESAREAN SECTION     hysteroscopic resection     implantable loop recorder implant  10/21/2019   Medtronic Reveal Linq model LNQ 22 ([place] [id number]) implantable loop recorder   IR CT HEAD LTD  12/01/2020   IR PERCUTANEOUS ART THROMBECTOMY/INFUSION INTRACRANIAL INC DIAG ANGIO  12/01/2020   IR US  GUIDE VASC ACCESS RIGHT  12/01/2020   ORIF HUMERUS FRACTURE Left 07/31/2021   Procedure: OPEN REDUCTION INTERNAL FIXATION (ORIF) DISTAL HUMERUS FRACTURE;  Surgeon: Kendal Franky SQUIBB, MD;  Location: [facility] OR;  Service: Orthopedics;  Laterality: Left;   RADIOLOGY WITH ANESTHESIA N/A 12/01/2020   Procedure: IR WITH ANESTHESIA - CODE STROKE;  Surgeon: Radiologist, Medication, MD;  Location: [facility] OR;  Service: Radiology;  Laterality: N/A;   TEE WITHOUT CARDIOVERSION N/A 12/08/2020   Procedure: TRANSESOPHAGEAL ECHOCARDIOGRAM (TEE);  Surgeon: Delford Maude BROCKS, MD;  Location: [facility] ENDOSCOPY;  Service: Cardiovascular;  Laterality: N/A;   Family History  Problem Relation Age of Onset   Cancer Father        non hodgkin lymphoma & skin   Heart attack Maternal Grandfather    Dementia Mother    Polymyositis Sister    Social History   Socioeconomic History   Marital status: Married    Spouse name: Actor   Number of children: 1   Years of education: Not on file   Highest education level: Not on file  Occupational History   Occupation: Audiological scientist   Occupation: Retired  Tobacco Use   Smoking status: Never   Smokeless tobacco: Never  Vaping Use   Vaping status: Never Used  Substance and Sexual Activity    Alcohol use: Never   Drug use: Never   Sexual activity: Not Currently    Partners: Male    Comment: husband vasectomy  Other Topics Concern   Not on file  Social History Narrative   Lives with husband   Grandchildren - 1   Works - Biochemist, 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%   Gun in home - yes - secured      Right handed   Caffeine: maybe tea every now and then   Social Drivers of [employer] Strain: Low Risk  (10/10/2023)   Overall Financial Resource Strain (CARDIA)    Difficulty of Paying Living Expenses: Not hard at all  Food Insecurity: No Food Insecurity (10/10/2023)   Hunger Vital Sign    Worried About Running  Out of Food in the Last Year: Never true    Ran Out of Food in the Last Year: Never true  Transportation Needs: No Transportation Needs (10/10/2023)   PRAPARE - Administrator, Civil Service (Medical): No    Lack of Transportation (Non-Medical): No  Physical Activity: Inactive (10/10/2023)   Exercise Vital Sign    Days of Exercise per Week: 0 days    Minutes of Exercise per Session: 0 min  Stress: Stress Concern Present (10/10/2023)   [employer] of Occupational Health - Occupational Stress Questionnaire    Feeling of Stress: Rather much  Social Connections: Socially Isolated (10/10/2023)   Social Connection and Isolation Panel    Frequency of Communication with Friends and Family: Never    Frequency of Social Gatherings with Friends and Family: Never    Attends Religious Services: Never    Database administrator or Organizations: No    Attends Engineer, structural: Not on file    Marital Status: Married    Tobacco Counseling Counseling given: Not Answered    Clinical Intake:  Pre-visit preparation completed: Yes  Pain : No/denies pain     BMI - recorded: 20.25 Nutritional Status: BMI of 19-24  Normal Nutritional Risks: None Diabetes: No  Lab Results  Component Value Date   HGBA1C 5.5 07/01/2023   HGBA1C 5.7 (H)  02/19/2023   HGBA1C 5.7 (H) 12/02/2020     How often do you need to have someone help you when you read instructions, pamphlets, or other written materials from your doctor or pharmacy?: 5 - Always (Spouse handles)  Interpreter Needed?: No  Information entered by :: Verdie Saba, CMA   Activities of Daily Living     10/10/2023   12:42 PM 09/07/2023    7:33 PM  In your present state of health, do you have any difficulty performing the following activities:  Hearing? 0 0  Vision? 0 0  Difficulty concentrating or making decisions? 1 0  Comment Spouse helps   Walking or climbing stairs? 1   Comment using a wheelchair   Dressing or bathing? 1   Comment CNA/RN to assists at [facility]   Doing errands, shopping? 1 0  Comment Spouse, CNA/RN to assists at [facility] Skilled [employer] and eating ? Y   Comment CNA/RN to assists at [facility]   Using the Toilet? Y   Comment CNA/RN to assists at [facility]   In the past six months, have you accidently leaked urine? Y   Comment wears a depend   Do you have problems with loss of bowel control? Y   Comment wears a depend   Managing your Medications? Y   Comment CNA/RN to assists at [facility] Skilled Nursing   Managing your Finances? N   Housekeeping or managing your Housekeeping? Y   Comment CNA assists     Patient Care Team: Purcell Emil Schanz, MD as PCP - General (Internal Medicine) de Dasie Bodily, OD as Consulting Physician (Optometry) Caleen Dirks, MD as Consulting Physician (Internal Medicine)  I have updated your Care Teams any recent Medical Services you may have received from other providers in the past year.     Assessment:   This is a routine wellness examination for Jacqueline Bonilla.  Hearing/Vision screen Hearing Screening - Comments:: Denies hearing difficulties   Vision Screening - Comments:: Wears rx glasses - up to date with routine eye exams with  [facility]  Goals Addressed               This Visit's Progress     Patient Stated (pt-stated)        Patient stated she plans to continue watching her diet, but currently residing at [facility]       Depression Screen     10/10/2023   12:45 PM 07/24/2023    2:06 PM 07/16/2023   10:46 AM 04/23/2023    1:56 PM 04/17/2023   10:18 AM 02/11/2023    1:04 PM 04/24/2022    9:22 AM  PHQ 2/9 Scores  PHQ - 2 Score 1 0 0 0 0 0 0  PHQ- 9 Score 5   1 6       Fall Risk     10/10/2023   12:38 PM 07/24/2023    2:06 PM 07/16/2023   10:45 AM 06/20/2023    1:54 PM 04/23/2023    1:56 PM  Fall Risk   Falls in the past year? 1 1 1 1 1   Number falls in past yr: 1 1 0 1 1  Comment 6      Injury with Fall? 0 1 0 0 1  Risk for fall due to : Impaired balance/gait History of fall(s) No Fall Risks  Impaired balance/gait;History of fall(s)  Follow up Falls evaluation completed;Falls prevention discussed Falls evaluation completed Falls evaluation completed      MEDICARE RISK AT HOME:  Medicare Risk at Home Any stairs in or around the home?: No If so, are there any without handrails?: No Home free of loose throw rugs in walkways, pet beds, electrical cords, etc?: Yes Adequate lighting in your home to reduce risk of falls?: Yes Life alert?: Yes (resides at [facility]) Use of a cane, walker or w/c?: Yes (using a wheelchair at [employer]) Grab bars in the bathroom?: Yes Shower chair or bench in shower?: Yes Elevated toilet seat or a handicapped toilet?: Yes  TIMED UP AND GO:  Was the test performed?  No  Cognitive Function: Impaired: Patient has current diagnosis of cognitive impairment.    10/10/2023   12:47 PM 02/19/2023    1:35 PM 04/12/2020    9:03 AM 06/11/2019    7:23 AM  MMSE - Mini Mental State Exam  Not completed: Unable to complete     Orientation to time  4 4 5   Orientation to Place  5 5 5   Registration  3 3 3   Attention/ Calculation  1 2 3   Recall  0 1 2  Language- name  2 objects  2 2 2   Language- repeat  1 1 1   Language- follow 3 step command  3 3 3   Language- read & follow direction  1 1 1   Write a sentence  1 1 0  Copy design  0 0 0  Total score  21 23 25         08/28/2021   10:07 AM  6CIT Screen  What Year? 0 points  What month? 0 points  What time? 3 points  Count back from 20 2 points  Months in reverse 2 points  Repeat phrase 2 points  Total Score 9 points    Immunizations Immunization History  Administered Date(s) Administered   Tdap 07/25/2011, 07/29/2021    Screening Tests Health Maintenance  Topic Date Due   Zoster Vaccines- Shingrix (1 of 2) Never done   Fecal DNA (Cologuard)  11/12/2022   Influenza Vaccine  Never done   Pneumococcal Vaccine: 50+  Years (1 of 1 - PCV) 12/23/2023 (Originally 05/25/2003)   Mammogram  02/22/2024   Medicare Annual Wellness (AWV)  10/09/2024   DTaP/Tdap/Td (3 - Td or Tdap) 07/30/2031   DEXA SCAN  Completed   Hepatitis C Screening  Completed   Meningococcal B Vaccine  Aged Out   COVID-19 Vaccine  Discontinued    Health Maintenance Items Addressed:  10/10/2023  Additional Screening:  Vision Screening: Recommended annual ophthalmology exams for early detection of glaucoma and other disorders of the eye. Is the patient up to date with their annual eye exam?  Yes  Who is the provider or what is the name of the office in which the patient attends annual eye exams? [facility]  Dental Screening: Recommended annual dental exams for proper oral hygiene  Community Resource Referral / Chronic Care Management: CRR required this visit?  No   CCM required this visit?  No   Plan:    I have personally reviewed and noted the following in the patient's chart:   Medical and social history Use of alcohol, tobacco or illicit drugs  Current medications and supplements including opioid prescriptions. Patient is not currently taking opioid prescriptions. Functional ability and status Nutritional  status Physical activity Advanced directives List of other physicians Hospitalizations, surgeries, and ER visits in previous 12 months Vitals Screenings to include cognitive, depression, and falls Referrals and appointments  In addition, I have reviewed and discussed with patient certain preventive protocols, quality metrics, and best practice recommendations. A written personalized care plan for preventive services as well as general preventive health recommendations were provided to patient.   Verdie CHRISTELLA Saba, CMA   10/10/2023   After Visit Summary: (MyChart) Due to this being a telephonic visit, the after visit summary with patients personalized plan was offered to patient via MyChart   Notes: Nothing significant to report at this time.

## 2023-10-11 DIAGNOSIS — R2689 Other abnormalities of gait and mobility: Secondary | ICD-10-CM | POA: Diagnosis not present

## 2023-10-11 DIAGNOSIS — R278 Other lack of coordination: Secondary | ICD-10-CM | POA: Diagnosis not present

## 2023-10-11 DIAGNOSIS — M6281 Muscle weakness (generalized): Secondary | ICD-10-CM | POA: Diagnosis not present

## 2023-10-11 DIAGNOSIS — G9389 Other specified disorders of brain: Secondary | ICD-10-CM | POA: Diagnosis not present

## 2023-10-11 DIAGNOSIS — R41841 Cognitive communication deficit: Secondary | ICD-10-CM | POA: Diagnosis not present

## 2023-10-14 DIAGNOSIS — R41841 Cognitive communication deficit: Secondary | ICD-10-CM | POA: Diagnosis not present

## 2023-10-14 DIAGNOSIS — R2689 Other abnormalities of gait and mobility: Secondary | ICD-10-CM | POA: Diagnosis not present

## 2023-10-14 DIAGNOSIS — R278 Other lack of coordination: Secondary | ICD-10-CM | POA: Diagnosis not present

## 2023-10-14 DIAGNOSIS — G9389 Other specified disorders of brain: Secondary | ICD-10-CM | POA: Diagnosis not present

## 2023-10-14 DIAGNOSIS — M6281 Muscle weakness (generalized): Secondary | ICD-10-CM | POA: Diagnosis not present

## 2023-10-15 DIAGNOSIS — M6281 Muscle weakness (generalized): Secondary | ICD-10-CM | POA: Diagnosis not present

## 2023-10-15 DIAGNOSIS — R41841 Cognitive communication deficit: Secondary | ICD-10-CM | POA: Diagnosis not present

## 2023-10-15 DIAGNOSIS — G9389 Other specified disorders of brain: Secondary | ICD-10-CM | POA: Diagnosis not present

## 2023-10-15 DIAGNOSIS — R278 Other lack of coordination: Secondary | ICD-10-CM | POA: Diagnosis not present

## 2023-10-15 DIAGNOSIS — R2689 Other abnormalities of gait and mobility: Secondary | ICD-10-CM | POA: Diagnosis not present

## 2023-10-16 DIAGNOSIS — M6281 Muscle weakness (generalized): Secondary | ICD-10-CM | POA: Diagnosis not present

## 2023-10-16 DIAGNOSIS — R278 Other lack of coordination: Secondary | ICD-10-CM | POA: Diagnosis not present

## 2023-10-16 DIAGNOSIS — R2689 Other abnormalities of gait and mobility: Secondary | ICD-10-CM | POA: Diagnosis not present

## 2023-10-16 DIAGNOSIS — G9389 Other specified disorders of brain: Secondary | ICD-10-CM | POA: Diagnosis not present

## 2023-10-16 DIAGNOSIS — F331 Major depressive disorder, recurrent, moderate: Secondary | ICD-10-CM | POA: Diagnosis not present

## 2023-10-16 DIAGNOSIS — R41841 Cognitive communication deficit: Secondary | ICD-10-CM | POA: Diagnosis not present

## 2023-10-16 DIAGNOSIS — F411 Generalized anxiety disorder: Secondary | ICD-10-CM | POA: Diagnosis not present

## 2023-10-16 DIAGNOSIS — I10 Essential (primary) hypertension: Secondary | ICD-10-CM | POA: Diagnosis not present

## 2023-10-18 ENCOUNTER — Telehealth: Payer: Self-pay

## 2023-10-18 NOTE — Transitions of Care (Post Inpatient/ED Visit) (Unsigned)
   10/18/2023  Name: Jacqueline Bonilla MRN: [id number] DOB: Nov 14, 1955  Today's TOC FU Call Status: Today's TOC FU Call Status:: Unsuccessful Call (1st Attempt) Unsuccessful Call (1st Attempt) Date: 10/18/23  Attempted to reach the patient regarding the most recent Inpatient/ED visit.  Follow Up Plan: Additional outreach attempts will be made to reach the patient to complete the Transitions of Care (Post Inpatient/ED visit) call.   Signature Julian Lemmings, LPN [facility] Nurse Health Advisor Direct Dial [phone]

## 2023-10-21 ENCOUNTER — Other Ambulatory Visit: Payer: Self-pay

## 2023-10-21 ENCOUNTER — Ambulatory Visit (INDEPENDENT_AMBULATORY_CARE_PROVIDER_SITE_OTHER): Admitting: Emergency Medicine

## 2023-10-21 ENCOUNTER — Encounter: Payer: Self-pay | Admitting: Emergency Medicine

## 2023-10-21 ENCOUNTER — Telehealth: Payer: Self-pay

## 2023-10-21 VITALS — BP 126/82 | HR 75 | Temp 98.1°F | Ht 64.0 in | Wt 113.0 lb

## 2023-10-21 DIAGNOSIS — F0150 Vascular dementia without behavioral disturbance: Secondary | ICD-10-CM

## 2023-10-21 DIAGNOSIS — R569 Unspecified convulsions: Secondary | ICD-10-CM

## 2023-10-21 DIAGNOSIS — E785 Hyperlipidemia, unspecified: Secondary | ICD-10-CM | POA: Diagnosis not present

## 2023-10-21 DIAGNOSIS — I10 Essential (primary) hypertension: Secondary | ICD-10-CM

## 2023-10-21 DIAGNOSIS — Z09 Encounter for follow-up examination after completed treatment for conditions other than malignant neoplasm: Secondary | ICD-10-CM | POA: Diagnosis not present

## 2023-10-21 DIAGNOSIS — Z8673 Personal history of transient ischemic attack (TIA), and cerebral infarction without residual deficits: Secondary | ICD-10-CM

## 2023-10-21 DIAGNOSIS — I69351 Hemiplegia and hemiparesis following cerebral infarction affecting right dominant side: Secondary | ICD-10-CM | POA: Diagnosis not present

## 2023-10-21 MED ORDER — LEVETIRACETAM 1000 MG PO TABS: 1000.0000 mg | ORAL_TABLET | Freq: Two times a day (BID) | ORAL | 1 refills | Status: DC

## 2023-10-21 MED ORDER — DIVALPROEX SODIUM 250 MG PO DR TAB: 250.0000 mg | DELAYED_RELEASE_TABLET | Freq: Two times a day (BID) | ORAL | 1 refills | Status: DC

## 2023-10-21 NOTE — Telephone Encounter (Signed)
 Refill sent with Dr. Purcell name.

## 2023-10-21 NOTE — Assessment & Plan Note (Signed)
 Stable.  At baseline.  No concerns.  Interacts with husband very well

## 2023-10-21 NOTE — Transitions of Care (Post Inpatient/ED Visit) (Signed)
   10/21/2023  Name: Jacqueline Bonilla MRN: [id number] DOB: November 25, 1955  Today's TOC FU Call Status: Today's TOC FU Call Status:: Unsuccessful Call (1st Attempt) Unsuccessful Call (1st Attempt) Date: 10/18/23  Attempted to reach the patient regarding the most recent Inpatient/ED visit.  Follow Up Plan: No further outreach attempts will be made at this time. We have been unable to contact the patient. Patient already seen in office Signature  Julian Lemmings, LPN [facility] Nurse Health Advisor Direct Dial [phone]

## 2023-10-21 NOTE — Telephone Encounter (Signed)
Okay to do so? 

## 2023-10-21 NOTE — Progress Notes (Signed)
 Jacqueline Bonilla 68 y.o.   Chief Complaint  Patient presents with   Follow-up    Following up on nursing home stay.     HISTORY OF PRESENT ILLNESS: This is a 68 y.o. female here for follow-up of recent hospital admission followed by stay at skilled nursing facility. Accompanied by husband today Stable.  No acute complaints or any other medical concerns today. BP Readings from Last 3 Encounters:  10/21/23 126/82  09/12/23 103/76  08/26/23 112/79   Wt Readings from Last 3 Encounters:  10/21/23 113 lb (51.3 kg)  10/10/23 118 lb (53.5 kg)  09/07/23 108 lb 14.5 oz (49.4 kg)   Physician Discharge Summary  DALAYLA ALDREDGE FMW:[id number] DOB: 04/05/1955 DOA: 09/03/2023   PCP: Purcell Emil Schanz, MD   Admit date: 09/03/2023 Discharge date: 09/12/2023   Admitted from: Home Discharge disposition: SNF   Recommendations at discharge:  Ensure compliance to antiseizure medicines.     Brief narrative: Jacqueline Bonilla is a 68 y.o. female with PMH significant for HTN, stroke with residual right-sided weakness, vascular dementia anemia, anxiety, depression who was recently hospitalized 8/14 to 8/25 for status epilepticus, required mechanical ventilation for 2 days and was ultimately discharged to SNF.  Prior to that hospitalization, patient was on Depakote  which was then switched to Keppra  by neurology. 9/2, patient was brought to the ED as a code stroke with complaint of left-sided weakness, difficulty speaking   Initial CT head unremarkable for acute intracranial abnormality, showed brain atrophy, chronic small vessel ischemic disease and chronic infarcts. Seen by neurology.  On exam, patient had some right gaze deviation which resolved spontaneously, aphasia improved as well. Per neurology, her symptoms could have been likely related to seizures. Keppra  level was sent. Started on Keppra  and Depakote  Admitted to [facility] Long-term EEG monitoring started  Hospital  course: Breakthrough seizures Recent hospitalization for status epilepticus Presented this time with left-sided weakness and difficulty speaking -spontaneously improved in the ED Imagings negative for new stroke Per neurology, symptoms likely secondary to breakthrough seizure Keppra  level was therapeutic. Long-term EEG monitoring was suggestive of cortical dysfunction in the left hemisphere likely secondary to underlying structural abnormality, no seizures were noted. Per neurology recommendation, currently patient is on Keppra  1 g twice daily and Depakote  250 mg twice daily. Has been tolerating seizure medicines well without any somnolence.   H/o stroke with residual right-sided weakness HLD Continue aspirin  and statin   Essential hypertension Hemodynamically stable without meds   Generalized weakness Impaired mobility Seen by PT.  CIR recommended but did not qualify.   SNF disposition today       HPI   Prior to Admission medications   Medication Sig Start Date End Date Taking? Authorizing Provider  acetaminophen  (TYLENOL ) 325 MG tablet Take 650 mg by mouth every 4 (four) hours as needed for moderate pain (pain score 4-6).   Yes [provider]  aspirin  81 MG chewable tablet Chew 1 tablet (81 mg total) by mouth daily. 04/02/23  Yes Shafer, Jorene, NP  atorvastatin  (LIPITOR) 40 MG tablet Take 1 tablet (40 mg total) by mouth daily. 05/29/23  Yes Keelan Pomerleau, Emil Schanz, MD  Cinnamon  500 MG capsule Take 500 mg by mouth at bedtime.   Yes [provider]  Coenzyme Q10 (CO Q 10 PO) Take 1 capsule by mouth daily.   Yes [provider]  divalproex  (DEPAKOTE ) 250 MG DR tablet Take 1 tablet (250 mg total) by mouth every 12 (twelve) hours. 09/09/23  Yes  Arlice Reichert, MD  Ferrous Sulfate (IRON PO) Take 365 mg by mouth at bedtime.   Yes [provider]  levETIRAcetam  (KEPPRA ) 1000 MG tablet Take 1 tablet (1,000 mg total) by mouth 2 (two) times daily. 08/26/23   Yes Perri DELENA Meliton Mickey., MD  Multiple Vitamin (MULTIVITAMIN WITH MINERALS) TABS tablet Take 1 tablet by mouth at bedtime.   Yes [provider]  Study - LIBREXIA-STROKE - milvexian 25 mg or placebo tablet (PI-Sethi) Take 1 tablet by mouth 2 (two) times daily. Patient not taking: Reported on 10/10/2023 06/26/23   Rosemarie Eather RAMAN, MD    Allergies  Allergen Reactions   Iodinated Contrast Media Hives   Iohexol  Hives   Quinolones Hives    Reaction with Cipro, Levaquin, Avelox   Shellfish Allergy Itching    Patient Active Problem List   Diagnosis Date Noted   Pressure injury of skin 08/19/2023   Fever 08/19/2023   Status epilepticus (HCC) 08/19/2023   Malnutrition of moderate degree 08/16/2023   Seizure (HCC) 08/15/2023   Injury of left ankle 07/25/2023   Gait disturbance, 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   Hemiparesis affecting right side as late effect of cerebrovascular accident (CVA) (HCC) 04/23/2023   Apraxia, 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   Frequent falls 02/11/2023   Statin intolerance 04/24/2022   Internal hemorrhoids 01/10/2021   Dyslipidemia 12/29/2020   Seizure-like activity (HCC) 08/30/2020   History of CVA (cerebrovascular accident) 07/14/2020   Normocytic anemia 07/11/2020   Vascular dementia without behavioral disturbance (HCC) 07/15/2019   History of COVID-19 04/22/2019   Essential hypertension 03/03/2019    Past Medical History:  Diagnosis Date   Acute ischemic left MCA stroke (HCC) 04/01/2023   right hemiparesis   Anxiety    Cognitive impairment    Depression    Dyslipidemia    Dysmenorrhea    Endometriosis    Fibroid    Frequent falls    Gait instability    Hypertension    Implantable loop recorder present 2021   Normocytic anemia    Osteoporosis    Seizure disorder (HCC)    Statin intolerance    Tachycardia    Vascular dementia [facility])     Past Surgical History:  Procedure Laterality Date   BREAST BIOPSY     BUBBLE STUDY  12/08/2020   Procedure:  BUBBLE STUDY;  Surgeon: Delford Maude BROCKS, MD;  Location: [facility] ENDOSCOPY;  Service: Cardiovascular;;   CESAREAN SECTION     hysteroscopic resection     implantable loop recorder implant  10/21/2019   Medtronic Reveal Linq model LNQ 22 ([place] [id number]) implantable loop recorder   IR CT HEAD LTD  12/01/2020   IR PERCUTANEOUS ART THROMBECTOMY/INFUSION INTRACRANIAL INC DIAG ANGIO  12/01/2020   IR US  GUIDE VASC ACCESS RIGHT  12/01/2020   ORIF HUMERUS FRACTURE Left 07/31/2021   Procedure: OPEN REDUCTION INTERNAL FIXATION (ORIF) DISTAL HUMERUS FRACTURE;  Surgeon: Kendal Franky SQUIBB, MD;  Location: [facility] OR;  Service: Orthopedics;  Laterality: Left;   RADIOLOGY WITH ANESTHESIA N/A 12/01/2020   Procedure: IR WITH ANESTHESIA - CODE STROKE;  Surgeon: Radiologist, Medication, MD;  Location: [facility] OR;  Service: Radiology;  Laterality: N/A;   TEE WITHOUT CARDIOVERSION N/A 12/08/2020   Procedure: TRANSESOPHAGEAL ECHOCARDIOGRAM (TEE);  Surgeon: Delford Maude BROCKS, MD;  Location: [facility] ENDOSCOPY;  Service: Cardiovascular;  Laterality: N/A;    Social History   Socioeconomic History   Marital status: Married    Spouse name: [name]   Number of children: 1   Years of education:  Not on file   Highest education level: Not on file  Occupational History   Occupation: accounting   Occupation: Retired  Tobacco Use   Smoking status: Never   Smokeless tobacco: Never  Vaping Use   Vaping status: Never Used  Substance and Sexual Activity   Alcohol use: Never   Drug use: Never   Sexual activity: Not Currently    Partners: Male    Comment: husband vasectomy  Other Topics Concern   Not on file  Social History Narrative   Lives with husband   Grandchildren - 1   Works - Biochemist, 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%   Gun in home - yes - secured      Right handed   Caffeine: maybe tea every now and then   Social Drivers of Corporate investment banker Strain: Low Risk  (10/10/2023)   Overall Financial Resource Strain (CARDIA)    Difficulty  of Paying Living Expenses: Not hard at all  Food Insecurity: No Food Insecurity (10/10/2023)   Hunger Vital Sign    Worried About Running Out of Food in the Last Year: Never true    Ran Out of Food in the Last Year: Never true  Transportation Needs: No Transportation Needs (10/10/2023)   PRAPARE - Administrator, Civil Service (Medical): No    Lack of Transportation (Non-Medical): No  Physical Activity: Inactive (10/10/2023)   Exercise Vital Sign    Days of Exercise per Week: 0 days    Minutes of Exercise per Session: 0 min  Stress: Stress Concern Present (10/10/2023)   [employer] of Occupational Health - Occupational Stress Questionnaire    Feeling of Stress: Rather much  Social Connections: Socially Isolated (10/10/2023)   Social Connection and Isolation Panel    Frequency of Communication with Friends and Family: Never    Frequency of Social Gatherings with Friends and Family: Never    Attends Religious Services: Never    Database administrator or Organizations: No    Attends Engineer, structural: Not on file    Marital Status: Married  Catering manager Violence: Not At Risk (10/10/2023)   Humiliation, Afraid, Rape, and Kick questionnaire    Fear of Current or Ex-Partner: No    Emotionally Abused: No    Physically Abused: No    Sexually Abused: No    Family History  Problem Relation Age of Onset   Cancer Father        non hodgkin lymphoma & skin   Heart attack Maternal Grandfather    Dementia Mother    Polymyositis Sister      Review of Systems  Constitutional: Negative.  Negative for chills and fever.  HENT: Negative.  Negative for congestion and sore throat.   Respiratory: Negative.  Negative for cough and shortness of breath.   Cardiovascular: Negative.  Negative for chest pain and palpitations.  Gastrointestinal:  Negative for abdominal pain, diarrhea, nausea and vomiting.  Genitourinary: Negative.  Negative for dysuria and hematuria.   Skin: Negative.  Negative for rash.  Neurological: Negative.  Negative for dizziness and headaches.  All other systems reviewed and are negative.   Vitals:   10/21/23 1055  BP: 126/82  Pulse: 75  Temp: 98.1 F (36.7 C)  SpO2: 95%    Physical Exam Vitals reviewed.  Constitutional:      Appearance: Normal appearance.  HENT:     Head: Normocephalic.     Mouth/Throat:  Mouth: Mucous membranes are moist.     Pharynx: Oropharynx is clear.  Eyes:     Extraocular Movements: Extraocular movements intact.     Pupils: Pupils are equal, round, and reactive to light.  Cardiovascular:     Rate and Rhythm: Normal rate and regular rhythm.     Pulses: Normal pulses.     Heart sounds: Normal heart sounds.  Pulmonary:     Effort: Pulmonary effort is normal.     Breath sounds: Normal breath sounds.  Musculoskeletal:     Cervical back: No tenderness.     Right lower leg: No edema.     Left lower leg: No edema.     Comments: Feet: Good distal pulses but sluggish capillary refill at the toes, 3 to 4 seconds.  Slight bluish discoloration of toes  Lymphadenopathy:     Cervical: No cervical adenopathy.  Neurological:     Mental Status: She is alert and oriented to person, place, and time. Mental status is at baseline.  Psychiatric:        Mood and Affect: Mood normal.        Behavior: Behavior normal.      ASSESSMENT & PLAN: A total of 45 minutes was spent with the patient and counseling/coordination of care regarding preparing for this visit, review of most recent office visit notes, review of most recent hospital discharge summary notes, review of multiple chronic medical conditions and their management, review of all medications, review of most recent bloodwork results, review of health maintenance items, education on nutrition, prognosis, documentation, and need for follow up.  Problem List Items Addressed This Visit       Cardiovascular and Mediastinum   Essential hypertension    BP Readings from Last 3 Encounters:  10/21/23 126/82  09/12/23 103/76  08/26/23 112/79  Well-controlled hypertension Continues losartan  12.5 mg daily         Nervous and Auditory   Vascular dementia without behavioral disturbance (HCC)   Stable.  At baseline.  No concerns.  Interacts with husband very well      Hemiparesis affecting right side as late effect of cerebrovascular accident (CVA) (HCC)   Is significantly affecting her ability to handle walker or cane Also affecting gait and stability Request for wheelchair was placed during last visits        Other   History of CVA (cerebrovascular accident)   Secondary prevention measures discussed Not diabetic Well-controlled hypertension Presently on atorvastatin  40 mg daily Continues daily baby aspirin .  No longer on Brilinta  Scheduled for neurologist follow-up      Dyslipidemia   Presently on atorvastatin  40 mg daily.       Seizure (HCC) - Primary   Clinically stable.  Most likely secondary to recent TIA or previous stroke residual effect. Presently on Depakote  250 mg twice a day and Keppra  1000 mg twice a day Recent brain imaging report reviewed Will follow-up with neurologist      Other Visit Diagnoses       Hospital discharge follow-up          Patient Instructions  Health Maintenance After Age 31 After age 59, you are at a higher risk for certain long-term diseases and infections as well as injuries from falls. Falls are a major cause of broken bones and head injuries in people who are older than age 28. Getting regular preventive care can help to keep you healthy and well. Preventive care includes getting regular testing and making lifestyle changes  as recommended by your health care provider. Talk with your health care provider about: Which screenings and tests you should have. A screening is a test that checks for a disease when you have no symptoms. A diet and exercise plan that is right for you. What  should I know about screenings and tests to prevent falls? Screening and testing are the best ways to find a health problem early. Early diagnosis and treatment give you the best chance of managing medical conditions that are common after age 40. Certain conditions and lifestyle choices may make you more likely to have a fall. Your health care provider may recommend: Regular vision checks. Poor vision and conditions such as cataracts can make you more likely to have a fall. If you wear glasses, make sure to get your prescription updated if your vision changes. Medicine review. Work with your health care provider to regularly review all of the medicines you are taking, including over-the-counter medicines. Ask your health care provider about any side effects that may make you more likely to have a fall. Tell your health care provider if any medicines that you take make you feel dizzy or sleepy. Strength and balance checks. Your health care provider may recommend certain tests to check your strength and balance while standing, walking, or changing positions. Foot health exam. Foot pain and numbness, as well as not wearing proper footwear, can make you more likely to have a fall. Screenings, including: Osteoporosis screening. Osteoporosis is a condition that causes the bones to get weaker and break more easily. Blood pressure screening. Blood pressure changes and medicines to control blood pressure can make you feel dizzy. Depression screening. You may be more likely to have a fall if you have a fear of falling, feel depressed, or feel unable to do activities that you used to do. Alcohol use screening. Using too much alcohol can affect your balance and may make you more likely to have a fall. Follow these instructions at home: Lifestyle Do not drink alcohol if: Your health care provider tells you not to drink. If you drink alcohol: Limit how much you have to: 0-1 drink a day for women. 0-2 drinks a  day for men. Know how much alcohol is in your drink. In the [place] one drink equals one 12 oz bottle of beer (355 mL), one 5 oz glass of wine (148 mL), or one 1 oz glass of hard liquor (44 mL). Do not use any products that contain nicotine or tobacco. These products include cigarettes, chewing tobacco, and vaping devices, such as e-cigarettes. If you need help quitting, ask your health care provider. Activity  Follow a regular exercise program to stay fit. This will help you maintain your balance. Ask your health care provider what types of exercise are appropriate for you. If you need a cane or walker, use it as recommended by your health care provider. Wear supportive shoes that have nonskid soles. Safety  Remove any tripping hazards, such as rugs, cords, and clutter. Install safety equipment such as grab bars in bathrooms and safety rails on stairs. Keep rooms and walkways well-lit. General instructions Talk with your health care provider about your risks for falling. Tell your health care provider if: You fall. Be sure to tell your health care provider about all falls, even ones that seem minor. You feel dizzy, tiredness (fatigue), or off-balance. Take over-the-counter and prescription medicines only as told by your health care provider. These include supplements. Eat a healthy diet and  maintain a healthy weight. A healthy diet includes low-fat dairy products, low-fat (lean) meats, and fiber from whole grains, beans, and lots of fruits and vegetables. Stay current with your vaccines. Schedule regular health, dental, and eye exams. Summary Having a healthy lifestyle and getting preventive care can help to protect your health and wellness after age 47. Screening and testing are the best way to find a health problem early and help you avoid having a fall. Early diagnosis and treatment give you the best chance for managing medical conditions that are more common for people who are older than  age 35. Falls are a major cause of broken bones and head injuries in people who are older than age 64. Take precautions to prevent a fall at home. Work with your health care provider to learn what changes you can make to improve your health and wellness and to prevent falls. This information is not intended to replace advice given to you by your health care provider. Make sure you discuss any questions you have with your health care provider. Document Revised: 05/09/2020 Document Reviewed: 05/09/2020 Elsevier Patient Education  2024 Elsevier Inc.     Emil Schaumann, MD [facility]

## 2023-10-21 NOTE — Assessment & Plan Note (Signed)
 Secondary prevention measures discussed Not diabetic Well-controlled hypertension Presently on atorvastatin  40 mg daily Continues daily baby aspirin .  No longer on Brilinta  Scheduled for neurologist follow-up

## 2023-10-21 NOTE — Telephone Encounter (Signed)
 Patient asking for you to take over Depokote and Keppra  medications. Prefers this office and the way things get handles. Please advise.

## 2023-10-21 NOTE — Patient Instructions (Signed)
 Health Maintenance After Age 70 After age 27, you are at a higher risk for certain long-term diseases and infections as well as injuries from falls. Falls are a major cause of broken bones and head injuries in people who are older than age 73. Getting regular preventive care can help to keep you healthy and well. Preventive care includes getting regular testing and making lifestyle changes as recommended by your health care provider. Talk with your health care provider about: Which screenings and tests you should have. A screening is a test that checks for a disease when you have no symptoms. A diet and exercise plan that is right for you. What should I know about screenings and tests to prevent falls? Screening and testing are the best ways to find a health problem early. Early diagnosis and treatment give you the best chance of managing medical conditions that are common after age 90. Certain conditions and lifestyle choices may make you more likely to have a fall. Your health care provider may recommend: Regular vision checks. Poor vision and conditions such as cataracts can make you more likely to have a fall. If you wear glasses, make sure to get your prescription updated if your vision changes. Medicine review. Work with your health care provider to regularly review all of the medicines you are taking, including over-the-counter medicines. Ask your health care provider about any side effects that may make you more likely to have a fall. Tell your health care provider if any medicines that you take make you feel dizzy or sleepy. Strength and balance checks. Your health care provider may recommend certain tests to check your strength and balance while standing, walking, or changing positions. Foot health exam. Foot pain and numbness, as well as not wearing proper footwear, can make you more likely to have a fall. Screenings, including: Osteoporosis screening. Osteoporosis is a condition that causes  the bones to get weaker and break more easily. Blood pressure screening. Blood pressure changes and medicines to control blood pressure can make you feel dizzy. Depression screening. You may be more likely to have a fall if you have a fear of falling, feel depressed, or feel unable to do activities that you used to do. Alcohol  use screening. Using too much alcohol  can affect your balance and may make you more likely to have a fall. Follow these instructions at home: Lifestyle Do not drink alcohol  if: Your health care provider tells you not to drink. If you drink alcohol : Limit how much you have to: 0-1 drink a day for women. 0-2 drinks a day for men. Know how much alcohol  is in your drink. In the [place] one drink equals one 12 oz bottle of beer (355 mL), one 5 oz glass of wine (148 mL), or one 1 oz glass of hard liquor (44 mL). Do not use any products that contain nicotine or tobacco. These products include cigarettes, chewing tobacco, and vaping devices, such as e-cigarettes. If you need help quitting, ask your health care provider. Activity  Follow a regular exercise program to stay fit. This will help you maintain your balance. Ask your health care provider what types of exercise are appropriate for you. If you need a cane or walker, use it as recommended by your health care provider. Wear supportive shoes that have nonskid soles. Safety  Remove any tripping hazards, such as rugs, cords, and clutter. Install safety equipment such as grab bars in bathrooms and safety rails on stairs. Keep rooms and walkways  well-lit. General instructions Talk with your health care provider about your risks for falling. Tell your health care provider if: You fall. Be sure to tell your health care provider about all falls, even ones that seem minor. You feel dizzy, tiredness (fatigue), or off-balance. Take over-the-counter and prescription medicines only as told by your health care provider. These include  supplements. Eat a healthy diet and maintain a healthy weight. A healthy diet includes low-fat dairy products, low-fat (lean) meats, and fiber from whole grains, beans, and lots of fruits and vegetables. Stay current with your vaccines. Schedule regular health, dental, and eye exams. Summary Having a healthy lifestyle and getting preventive care can help to protect your health and wellness after age 15. Screening and testing are the best way to find a health problem early and help you avoid having a fall. Early diagnosis and treatment give you the best chance for managing medical conditions that are more common for people who are older than age 42. Falls are a major cause of broken bones and head injuries in people who are older than age 64. Take precautions to prevent a fall at home. Work with your health care provider to learn what changes you can make to improve your health and wellness and to prevent falls. This information is not intended to replace advice given to you by your health care provider. Make sure you discuss any questions you have with your health care provider. Document Revised: 05/09/2020 Document Reviewed: 05/09/2020 Elsevier Patient Education  2024 [employer].

## 2023-10-21 NOTE — Assessment & Plan Note (Signed)
 Clinically stable.  Most likely secondary to recent TIA or previous stroke residual effect. Presently on Depakote  250 mg twice a day and Keppra  1000 mg twice a day Recent brain imaging report reviewed Will follow-up with neurologist

## 2023-10-21 NOTE — Assessment & Plan Note (Signed)
 Presently on atorvastatin 40 mg daily.

## 2023-10-21 NOTE — Assessment & Plan Note (Signed)
 Is significantly affecting her ability to handle walker or cane Also affecting gait and stability Request for wheelchair was placed during last visits

## 2023-10-21 NOTE — Assessment & Plan Note (Signed)
 BP Readings from Last 3 Encounters:  10/21/23 126/82  09/12/23 103/76  08/26/23 112/79  Well-controlled hypertension Continues losartan  12.5 mg daily

## 2023-10-22 ENCOUNTER — Encounter

## 2023-10-23 ENCOUNTER — Telehealth: Payer: Self-pay

## 2023-10-23 NOTE — Telephone Encounter (Signed)
 Copied from CRM [id number]. Topic: Clinical - Home Health Verbal Orders >> Oct 23, 2023  9:53 AM Vena HERO wrote: Caller/Agency: [facility] Callback Number: [phone] Service Requested: Speech Therapy Frequency: once a week for 9 weeks Any new concerns about the patient? No

## 2023-10-24 ENCOUNTER — Telehealth: Payer: Self-pay

## 2023-10-24 ENCOUNTER — Ambulatory Visit: Attending: Emergency Medicine

## 2023-10-24 ENCOUNTER — Encounter

## 2023-10-24 NOTE — Telephone Encounter (Signed)
 [facility] faxe completed 10-24-2023

## 2023-10-24 NOTE — Telephone Encounter (Signed)
 Verbal orders given

## 2023-11-18 ENCOUNTER — Ambulatory Visit: Payer: Self-pay

## 2023-11-18 NOTE — Telephone Encounter (Signed)
 Called [facility] to advise of patient's symptoms and ER refusal at this time and patient's husband wanting Dr Lebron recommendations at this time

## 2023-11-18 NOTE — Telephone Encounter (Signed)
 Severe weakness dizziness and fatigue needs to be evaluated by medical provider.  Difficult to assess over the phone.  Given her history, it is probably best to be seen in emergency department.

## 2023-11-18 NOTE — Telephone Encounter (Signed)
 Refused ER at this time & wants Dr Lebron further advice on patient's symptoms. Husband isnt sure if the new medications Depakote  and Keppra  are causing severe weakness/dizziness/fatigue-  FYI Only or Action Required?: Action required by provider: clinical question for provider, update on patient condition, and patient's husband wants pcp's advice.  Patient was last seen in primary care on 10/21/2023 by Purcell Emil Schanz, MD.  Called Nurse Triage reporting Fatigue.  Symptoms began several weeks ago.  Interventions attempted: Rest, hydration, or home remedies.  Symptoms are: progressing in some areas but also having fatigue/weakness/dizziness that have been since medication change.  Triage Disposition: Go to ED Now (or PCP Triage)  Patient/caregiver understands and will follow disposition?: No, wishes to speak with PCP         Copied from CRM #[id number]. Topic: Clinical - Prescription Issue >> Nov 18, 2023  1:27 PM China J wrote: Reason for CRM: The patient is calling to let Dr. Purcell know that she is still experiencing extreme weakness and fatigue evan after taking divalproex  (DEPAKOTE ) 250 MG, and levETIRAcetam  (KEPPRA ) 1000 MG. She is also urinating more frequently and having dizziness and trouble balancing. Reason for Disposition  Patient sounds very sick or weak to the triager  Answer Assessment - Initial Assessment Questions Side effects---dizziness even when laying down, loss of appetite at first---has come back, weakness and fatigue (extreme), sleeping a lot, urinating frequently  6 weeks-2 months patient has been having these possible side effects Patient started Keppra  and Depakote  when she was in the hospital in August for a seizure.  Spoke with patient who verified her name and birthday and states that she feels very tired--very weakness and fatigue  Given patient's medical history, and dealing with severe weakness/fatigue where he       1. SYMPTOM:  What is the main symptom you are concerned about? (e.g., weakness, numbness)     ----- 2. ONSET: When did this start? (e.g., minutes, hours, days; while sleeping)     ------ 3. LAST NORMAL: When was the last time you (the patient) were normal (no symptoms)?     ------ 4. PATTERN Does this come and go, or has it been constant since it started?  Is it present now?     ----- 5. CARDIAC SYMPTOMS: Have you had any of the following symptoms: chest pain, difficulty breathing, palpitations?     ------ 6. NEUROLOGIC SYMPTOMS: Have you had any of the following symptoms: headache, dizziness, vision loss, double vision, changes in speech, unsteady on your feet?  Answer Assessment - Initial Assessment Questions Husband called to advise wife has been having what he thinks are   He states she has made a lot of progress with balance and cognition but she gets very easily fatigued. He states he doesn't know if she may need a lower dose Side effects---dizziness even when laying down, loss of appetite at first---has come back, weakness and fatigue (extreme), sleeping a lot, urinating frequently  6 weeks-2 months patient has been having these possible side effects Patient started Keppra  and Depakote  when she was in the hospital in August for a seizure. Husband is concerned that these two meds have be causing these symptoms but they just have not gotten better Patient's husband states that four different nurses come in twice a day to check on her and also occupational/physical/speech therapy all have seen her as well. Husband is advised that at this time the recommendation at this time is for her to go to the  ER for her to be further evaluated but they wanted to only see Dr Purcell   1. DESCRIPTION: Describe how you are feeling.     Patient states she feels very tired 2. SEVERITY: How bad is it?  Can you stand and walk?     Patient is able to walk per husband 4. CAUSE: What do you think  is causing the weakness or fatigue? (e.g., not drinking enough fluids, medical problem, trouble sleeping)     Unsure--- 5. NEW MEDICINES:  Have you started on any new medicines recently? (e.g., opioid pain medicines, benzodiazepines, muscle relaxants, antidepressants, antihistamines, neuroleptics, beta blockers)     Depakote  and Keppra ----husband states he isnt sure of the dosages but a neurologist prescribed these meds from [facility] and patient's husband wants Dr Purcell  Protocols used: Neurologic Deficit-A-AH, Weakness (Generalized) and Fatigue-A-AH

## 2023-11-18 NOTE — Telephone Encounter (Signed)
 Called and spoke w pt husband and advised of pcp response regarding symptoms and would be best to go to ED. Husband verbalized understanding

## 2023-11-18 NOTE — Telephone Encounter (Signed)
 Refused ER at this time & wants Dr Lebron further advice on patient's symptoms. Husband isnt sure if the new medications Depakote  and Keppra  are causing severe weakness/dizziness/fatigue

## 2023-11-19 ENCOUNTER — Emergency Department (HOSPITAL_COMMUNITY)
Admission: EM | Admit: 2023-11-19 | Discharge: 2023-11-19 | Disposition: A | Discharge status: Home / Self Care | Attending: Emergency Medicine | Admitting: Emergency Medicine

## 2023-11-19 ENCOUNTER — Other Ambulatory Visit: Payer: Self-pay

## 2023-11-19 ENCOUNTER — Encounter (HOSPITAL_COMMUNITY): Payer: Self-pay

## 2023-11-19 ENCOUNTER — Emergency Department (HOSPITAL_COMMUNITY)

## 2023-11-19 DIAGNOSIS — E860 Dehydration: Secondary | ICD-10-CM | POA: Insufficient documentation

## 2023-11-19 DIAGNOSIS — R531 Weakness: Secondary | ICD-10-CM

## 2023-11-19 DIAGNOSIS — Z8673 Personal history of transient ischemic attack (TIA), and cerebral infarction without residual deficits: Secondary | ICD-10-CM | POA: Insufficient documentation

## 2023-11-19 DIAGNOSIS — R42 Dizziness and giddiness: Secondary | ICD-10-CM | POA: Diagnosis present

## 2023-11-19 DIAGNOSIS — R930 Abnormal findings on diagnostic imaging of skull and head, not elsewhere classified: Secondary | ICD-10-CM | POA: Diagnosis not present

## 2023-11-19 DIAGNOSIS — Z7982 Long term (current) use of aspirin: Secondary | ICD-10-CM | POA: Diagnosis not present

## 2023-11-19 DIAGNOSIS — D649 Anemia, unspecified: Secondary | ICD-10-CM | POA: Insufficient documentation

## 2023-11-19 DIAGNOSIS — G4710 Hypersomnia, unspecified: Secondary | ICD-10-CM | POA: Diagnosis present

## 2023-11-19 DIAGNOSIS — M6281 Muscle weakness (generalized): Secondary | ICD-10-CM | POA: Diagnosis not present

## 2023-11-19 DIAGNOSIS — Z79899 Other long term (current) drug therapy: Secondary | ICD-10-CM | POA: Diagnosis not present

## 2023-11-19 DIAGNOSIS — F0150 Vascular dementia without behavioral disturbance: Secondary | ICD-10-CM | POA: Diagnosis not present

## 2023-11-19 DIAGNOSIS — I69351 Hemiplegia and hemiparesis following cerebral infarction affecting right dominant side: Secondary | ICD-10-CM | POA: Diagnosis not present

## 2023-11-19 DIAGNOSIS — R4182 Altered mental status, unspecified: Secondary | ICD-10-CM | POA: Diagnosis not present

## 2023-11-19 DIAGNOSIS — F0390 Unspecified dementia without behavioral disturbance: Secondary | ICD-10-CM | POA: Insufficient documentation

## 2023-11-19 LAB — VALPROIC ACID LEVEL: Valproic Acid Lvl: 27 ug/mL — ABNORMAL LOW (ref 50–100)

## 2023-11-19 LAB — CBC
HCT: 39.2 % (ref 36.0–46.0)
Hemoglobin: 12.7 g/dL (ref 12.0–15.0)
MCH: 29.8 pg (ref 26.0–34.0)
MCHC: 32.4 g/dL (ref 30.0–36.0)
MCV: 92 fL (ref 80.0–100.0)
Platelets: 210 K/uL (ref 150–400)
RBC: 4.26 MIL/uL (ref 3.87–5.11)
RDW: 14.7 % (ref 11.5–15.5)
WBC: 5 K/uL (ref 4.0–10.5)
nRBC: 0 % (ref 0.0–0.2)

## 2023-11-19 LAB — COMPREHENSIVE METABOLIC PANEL WITH GFR
ALT: 14 U/L (ref 0–44)
AST: 19 U/L (ref 15–41)
Albumin: 3.5 g/dL (ref 3.5–5.0)
Alkaline Phosphatase: 46 U/L (ref 38–126)
Anion gap: 11 (ref 5–15)
BUN: 21 mg/dL (ref 8–23)
CO2: 25 mmol/L (ref 22–32)
Calcium: 9.2 mg/dL (ref 8.9–10.3)
Chloride: 103 mmol/L (ref 98–111)
Creatinine, Ser: 0.86 mg/dL (ref 0.44–1.00)
GFR, Estimated: >60 mL/min (ref >60)
Glucose, Bld: 96 mg/dL (ref 70–99)
Potassium: 4.1 mmol/L (ref 3.5–5.1)
Sodium: 139 mmol/L (ref 135–145)
Total Bilirubin: 0.5 mg/dL (ref 0.0–1.2)
Total Protein: 7.5 g/dL (ref 6.5–8.1)

## 2023-11-19 LAB — URINALYSIS, ROUTINE W REFLEX MICROSCOPIC
Bacteria, UA: NONE SEEN
Bilirubin Urine: NEGATIVE
Glucose, UA: NEGATIVE mg/dL
Hgb urine dipstick: NEGATIVE
Ketones, ur: NEGATIVE mg/dL
Nitrite: NEGATIVE
Protein, ur: NEGATIVE mg/dL
Specific Gravity, Urine: 1.017 (ref 1.005–1.030)
pH: 5 (ref 5.0–8.0)

## 2023-11-19 LAB — RESP PANEL BY RT-PCR (RSV, FLU A&B, COVID)  RVPGX2
Influenza A by PCR: NEGATIVE
Influenza B by PCR: NEGATIVE
Resp Syncytial Virus by PCR: NEGATIVE
SARS Coronavirus 2 by RT PCR: NEGATIVE

## 2023-11-19 LAB — MAGNESIUM: Magnesium: 2 mg/dL (ref 1.7–2.4)

## 2023-11-19 LAB — CBG MONITORING, ED: Glucose-Capillary: 89 mg/dL (ref 70–99)

## 2023-11-19 LAB — RAPID URINE DRUG SCREEN, HOSP PERFORMED
Amphetamines: NOT DETECTED
Barbiturates: NOT DETECTED
Benzodiazepines: NOT DETECTED
Cocaine: NOT DETECTED
Opiates: NOT DETECTED
Tetrahydrocannabinol: NOT DETECTED

## 2023-11-19 LAB — ETHANOL: Alcohol, Ethyl (B): <15 mg/dL (ref <15)

## 2023-11-19 LAB — TSH: TSH: 1.038 u[IU]/mL (ref 0.350–4.500)

## 2023-11-19 MED ORDER — LACTATED RINGERS IV BOLUS
1000.0000 mL | Freq: Once | INTRAVENOUS | Status: AC
  Administered 2023-11-19: 1000 mL via INTRAVENOUS

## 2023-11-19 NOTE — ED Triage Notes (Signed)
 Pt recently started on depakote  and keppra  8 weeks ago, per husband she has confusion, weakness, balance issues, sleeping 16 hours a day, lack of focus. Husband reports these symptoms have come on progressively. Also having urinary frequency. Called doctor and was advised to bring her to ER

## 2023-11-19 NOTE — ED Notes (Signed)
 Patient transported to X-ray

## 2023-11-19 NOTE — ED Provider Triage Note (Signed)
 Emergency Medicine Provider Triage Evaluation Note  Jacqueline Bonilla , a 68 y.o. female h/o seizures, CVA w/ residual R-sided weakness, vascular dementia, anemia was evaluated in triage.  Pt complains of dizziness, tremoring, loss of concentration, looking hypnotized in the middle of a task. Sleeping ~20 hours per day. Increased nocturia. Eating/drinking okay.   Recently admitted 9/2-9/11 for status epilepticus. Per DC summary: Presented this time with left-sided weakness and difficulty speaking -spontaneously improved in the ED. Imaging negative for new stroke Per neurology, symptoms likely secondary to breakthrough seizureKeppra level was therapeutic. Long-term EEG monitoring was suggestive of cortical dysfunction in the left hemisphere likely secondary to underlying structural abnormality, no seizures were noted. Per neurology recommendation, currently patient is on Keppra  1 g twice daily and Depakote  250 mg twice daily. Has been tolerating seizure medicines well without any somnolence.  Review of Systems  Positive: As above as well as increased spasticity in her right side and more frequent headaches but no headache right now Negative: Fevers/chills, nausea/vomiting, dysuria/hematuria, cough/SOB, abdominal pain  Physical Exam  BP 137/87   Pulse 87   Temp 98.3 F (36.8 C) (Oral)   Resp 20   LMP 01/02/2003   SpO2 98%  Gen:   Awake, no distress  Resp:  Normal effort MSK:   Moves extremities without difficulty Other:  Normal speech. A&Ox4.   Medical Decision Making  Medically screening exam initiated at 9:17 AM.  Appropriate orders placed.  Jacqueline Bonilla was informed that the remainder of the evaluation will be completed by another provider, this initial triage assessment does not replace that evaluation, and the importance of remaining in the ED until their evaluation is complete.  Vitally stable. Overall well-appearing. Labs/imaging ordered. Patient will be moved to  treatment space when one becomes available.    Jacqueline Sid SAILOR, MD 11/19/23 [phone]

## 2023-11-19 NOTE — Discharge Instructions (Addendum)
 I am glad that your symptoms improved with some IV fluids.  Make sure you are drinking plenty of water at home.  Return to the emergency room for any new or concerning symptoms otherwise follow-up with your primary care office.  Continue with physical therapy.

## 2023-11-19 NOTE — ED Provider Notes (Signed)
 [place] EMERGENCY DEPARTMENT AT [facility] Provider Note   CSN: [id number] Arrival date & time: 11/19/23  9194     Patient presents with: Weakness and Dizziness   Jacqueline Bonilla is a 67 y.o. female.  {Add pertinent medical, surgical, social history, OB history to HPI:32947}  Weakness Associated symptoms: dizziness   Dizziness Associated symptoms: weakness           Prior to Admission medications   Medication Sig Start Date End Date Taking? Authorizing Provider  acetaminophen  (TYLENOL ) 325 MG tablet Take 650 mg by mouth every 4 (four) hours as needed for moderate pain (pain score 4-6).    [provider]  aspirin  81 MG chewable tablet Chew 1 tablet (81 mg total) by mouth daily. 04/02/23   Remi Pippin, NP  atorvastatin  (LIPITOR) 40 MG tablet Take 1 tablet (40 mg total) by mouth daily. 05/29/23   Purcell Emil Schanz, MD  Cinnamon  500 MG capsule Take 500 mg by mouth at bedtime.    [provider]  Coenzyme Q10 (CO Q 10 PO) Take 1 capsule by mouth daily.    [provider]  divalproex  (DEPAKOTE ) 250 MG DR tablet Take 1 tablet (250 mg total) by mouth every 12 (twelve) hours. 10/21/23   Sagardia, Miguel Jose, MD  Ferrous Sulfate (IRON PO) Take 365 mg by mouth at bedtime.    [provider]  levETIRAcetam  (KEPPRA ) 1000 MG tablet Take 1 tablet (1,000 mg total) by mouth 2 (two) times daily. 10/21/23   Purcell Emil Schanz, MD  Multiple Vitamin (MULTIVITAMIN WITH MINERALS) TABS tablet Take 1 tablet by mouth at bedtime.    [provider]    Allergies: Iodinated contrast media, Iohexol , Quinolones, and Shellfish allergy    Review of Systems  Neurological:  Positive for dizziness and weakness.    Updated Vital Signs BP 137/78   Pulse 70   Temp 98.1 F (36.7 C) (Oral)   Resp 11   Ht 5' 4 (1.626 m)   Wt 53.5 kg   LMP 01/02/2003   SpO2 100%   BMI 20.25 kg/m   Physical Exam  (all labs ordered are listed, but  only abnormal results are displayed) Labs Reviewed  URINALYSIS, ROUTINE W REFLEX MICROSCOPIC - Abnormal; Notable for the following components:      Result Value   Leukocytes,Ua TRACE (*)    All other components within normal limits  VALPROIC  ACID LEVEL - Abnormal; Notable for the following components:   Valproic  Acid Lvl 27 (*)    All other components within normal limits  RESP PANEL BY RT-PCR (RSV, FLU A&B, COVID)  RVPGX2  COMPREHENSIVE METABOLIC PANEL WITH GFR  CBC  MAGNESIUM   ETHANOL  RAPID URINE DRUG SCREEN, HOSP PERFORMED  TSH  LEVETIRACETAM  LEVEL  AMMONIA  CBG MONITORING, ED    EKG: EKG Interpretation Date/Time:  Tuesday November 19 2023 08:45:17 EST Ventricular Rate:  85 PR Interval:  148 QRS Duration:  94 QT Interval:  370 QTC Calculation: 440 R Axis:   14  Text Interpretation: Normal sinus rhythm Normal ECG Confirmed by Franklyn Gills [phone]) on 11/19/2023 10:55:57 AM  Radiology: [facility] Chest 2 View Result Date: 11/19/2023 EXAM: 2 VIEW(S) XRAY OF THE CHEST 11/19/2023 01:42:00 PM COMPARISON: 08/16/2023 CLINICAL HISTORY: weakness FINDINGS: LUNGS AND PLEURA: No focal pulmonary opacity. No pleural effusion. No pneumothorax. HEART AND MEDIASTINUM: No acute abnormality of the cardiac and mediastinal silhouettes. BONES AND SOFT TISSUES: No acute osseous abnormality. IMPRESSION: 1. No acute cardiopulmonary  process. Electronically signed by: Lynwood Seip MD 11/19/2023 01:56 PM EST RP Workstation: [id number]   CT Head Wo Contrast Result Date: 11/19/2023 EXAM: CT HEAD WITHOUT CONTRAST 11/19/2023 09:45:38 AM TECHNIQUE: CT of the head was performed without the administration of intravenous contrast. Automated exposure control, iterative reconstruction, and/or weight based adjustment of the mA/kV was utilized to reduce the radiation dose to as low as reasonably achievable. COMPARISON: 09/03/2023 CLINICAL HISTORY: Mental status change, unknown cause. FINDINGS: BRAIN AND VENTRICLES: No  acute hemorrhage. No evidence of acute infarct. Chronic infarct in right occipital lobe. There are lesser encephalomalacia changes within the right frontal lobe from small focal cortical infarct. Small chronic infarct is also noted in the inferior aspect of the left cerebellar hemisphere. Extensive periventricular and subcortical white matter low-density changes compatible with chronic microvascular ischemic change. Moderate diffuse cerebral volume loss. Atherosclerotic calcifications in large vessels of skull base. No hydrocephalus. No extra-axial collection. No mass effect or midline shift. ORBITS: No acute abnormality. SINUSES: No acute abnormality. SOFT TISSUES AND SKULL: No acute soft tissue abnormality. No skull fracture. IMPRESSION: 1. No acute intracranial abnormality. 2. Chronic infarcts in the right occipital lobe, right frontal lobe, and left cerebellar hemisphere. 3. Extensive periventricular and subcortical white matter low-density changes compatible with chronic microvascular ischemic change. 4. Moderate diffuse cerebral volume loss. Electronically signed by: Evalene Coho MD 11/19/2023 09:56 AM EST RP Workstation: [id number]    {Document cardiac monitor, telemetry assessment procedure when appropriate:32947} Procedures   Medications Ordered in the ED  lactated ringers  bolus 1,000 mL (0 mLs Intravenous Stopped 11/19/23 1444)    Clinical Course as of 11/19/23 1509  Tue Nov 19, 2023  1306 Gait belt at baseline [WF]    Clinical Course User Index [WF] Neldon Hamp RAMAN, [place]   {Click here for ABCD2, HEART and other calculators REFRESH Note before signing:1}                              Medical Decision Making Amount and/or Complexity of Data Reviewed Labs: ordered. Radiology: ordered.   ***  {Document critical care time when appropriate  Document review of labs and clinical decision tools ie CHADS2VASC2, etc  Document your independent review of radiology images and any outside  records  Document your discussion with family members, caretakers and with consultants  Document social determinants of health affecting pt's care  Document your decision making why or why not admission, treatments were needed:32947:::1}   Final diagnoses:  None    ED Discharge Orders     None

## 2023-11-20 LAB — LEVETIRACETAM LEVEL: Levetiracetam Lvl: 20.8 ug/mL (ref 10.0–40.0)

## 2023-11-22 ENCOUNTER — Encounter

## 2023-11-23 ENCOUNTER — Other Ambulatory Visit: Payer: Self-pay | Admitting: Emergency Medicine

## 2023-11-23 DIAGNOSIS — Z8673 Personal history of transient ischemic attack (TIA), and cerebral infarction without residual deficits: Secondary | ICD-10-CM

## 2023-11-23 DIAGNOSIS — R569 Unspecified convulsions: Secondary | ICD-10-CM

## 2023-11-24 ENCOUNTER — Ambulatory Visit: Attending: Emergency Medicine

## 2023-11-25 ENCOUNTER — Encounter

## 2023-12-03 ENCOUNTER — Ambulatory Visit: Payer: Self-pay

## 2023-12-03 ENCOUNTER — Telehealth: Payer: Self-pay

## 2023-12-03 DIAGNOSIS — Z8673 Personal history of transient ischemic attack (TIA), and cerebral infarction without residual deficits: Secondary | ICD-10-CM

## 2023-12-03 NOTE — Telephone Encounter (Signed)
 FYI Only or Action Required?: FYI only for provider: appointment scheduled on 12/4.  Patient was last seen in primary care on 10/21/2023 by Purcell Emil Schanz, MD.  Called Nurse Triage reporting Urinary Frequency.  Symptoms began several weeks ago.  Interventions attempted: Nothing.  Symptoms are: unchanged.  Triage Disposition: See Physician Within 24 Hours  Patient/caregiver understands and will follow disposition?: No  Copied from CRM #[id number]. Topic: Clinical - Red Word Triage >> Dec 03, 2023 12:53 PM Mesmerise C wrote: [facility] that prompted transfer to Nurse Triage: Patient experiencing same symptoms from divalproex  (DEPAKOTE ) 250 MG DR tablet  and levETIRAcetam  (KEPPRA ) 1000 MG tablet tremors, frequent urination especially at night, sometimes can't make it to the restroom, also weakness causing to fall Reason for Disposition  Urinating more frequently than usual (i.e., frequency) OR new-onset of the feeling of an urgent need to urinate (i.e., urgency)  Answer Assessment - Initial Assessment Questions 1. SYMPTOM: What's the main symptom you're concerned about? (e.g., frequency, incontinence)     frequency 2. ONSET: When did the  frequency  start?     Per husband who is calling states that it has been weeks, but reports that it was about 3 months total 3. PAIN: Is there any pain? If Yes, ask: How bad is it? (Scale: 1-10; mild, moderate, severe)     Denies new pain 4. CAUSE: What do you think is causing the symptoms?     Pt husband feels that it is d/t the keppra  and depakote  5. OTHER SYMPTOMS: Do you have any other symptoms? (e.g., blood in urine, fever, flank pain, pain with urination)     Dizziness, tremors, increased urination  Per caller pt was seen in the ED for these s/s and was told to f/u with PCP. Caller states that s/s have not worsened. Denies improvement of s/s. Caller advised to also speak to neurologist.  Caller states that he is concerned that this  may all be caused by her sz medications. Caller/husband declined appts offered for tomorrow with PCP. Scheduled for 12/4 at caller request.  Protocols used: Urinary Symptoms-A-AH

## 2023-12-03 NOTE — Addendum Note (Signed)
 Addended by: ROSALVA LEX RAMAN on: 12/03/2023 04:37 PM   Modules accepted: Orders

## 2023-12-03 NOTE — Telephone Encounter (Signed)
 It look like you have placed a referral back in August for physical therapy but it was closed due to them not accepting adults

## 2023-12-03 NOTE — Telephone Encounter (Signed)
 Copied from CRM [id number]. Topic: General - Other >> Dec 03, 2023 12:50 PM Mesmerise C wrote: Reason for CRM: Patient's husband wants Dr.Sagardia to expedite process to get patient into outpatient physical therapy can be reached at [phone]

## 2023-12-03 NOTE — Telephone Encounter (Signed)
 Place it again please.

## 2023-12-04 ENCOUNTER — Telehealth: Payer: Self-pay | Admitting: Neurology

## 2023-12-04 NOTE — Telephone Encounter (Signed)
 I called pts husband.  Pt is not having seizures, but is extremely weak, unsteady, dizziness.  She is hydrated, eating per husband. Taking her medications, he mentioned that seen in ED 2 wks ago (11/19/2023) depakote /keppra  levels normal, told to resume meds.  His main concern is that he feels that sz meds are causing the unsteadiness/ weakness/ dizziness and he feels that if changing sz medications would help with these SE.  I spoke to research and they will get pt in next week with Dr. Rosemarie to /eval/discuss.  Pt has appt tomorrow with pcp.  I told him that was good, of course any acute changes go to ED/urgent care. He verbalized understanding.

## 2023-12-04 NOTE — Telephone Encounter (Signed)
 Pt husband called stating that PT have been experiencing dizziness and weakness  and may be having Seizure  symptoms. Pt husband stated that he will be taking her to ER  , However Pt husband is suggesting for Pt to be seen sooner if possible .

## 2023-12-05 ENCOUNTER — Ambulatory Visit: Admitting: Emergency Medicine

## 2023-12-05 ENCOUNTER — Encounter: Payer: Self-pay | Admitting: Emergency Medicine

## 2023-12-05 VITALS — BP 142/82 | HR 122 | Temp 98.0°F | Ht 64.0 in | Wt 117.0 lb

## 2023-12-05 DIAGNOSIS — I10 Essential (primary) hypertension: Secondary | ICD-10-CM | POA: Diagnosis not present

## 2023-12-05 DIAGNOSIS — F0150 Vascular dementia without behavioral disturbance: Secondary | ICD-10-CM

## 2023-12-05 DIAGNOSIS — R569 Unspecified convulsions: Secondary | ICD-10-CM

## 2023-12-05 DIAGNOSIS — Z8673 Personal history of transient ischemic attack (TIA), and cerebral infarction without residual deficits: Secondary | ICD-10-CM

## 2023-12-05 DIAGNOSIS — E785 Hyperlipidemia, unspecified: Secondary | ICD-10-CM | POA: Diagnosis not present

## 2023-12-05 DIAGNOSIS — R351 Nocturia: Secondary | ICD-10-CM | POA: Diagnosis not present

## 2023-12-05 DIAGNOSIS — I69351 Hemiplegia and hemiparesis following cerebral infarction affecting right dominant side: Secondary | ICD-10-CM

## 2023-12-05 DIAGNOSIS — R531 Weakness: Secondary | ICD-10-CM

## 2023-12-05 NOTE — Assessment & Plan Note (Signed)
 Stable. At baseline. No concerns. Interacts with husband very well  However lately has developed emotional stability issues

## 2023-12-05 NOTE — Assessment & Plan Note (Signed)
 Clinically stable.  Most likely secondary to recent TIA or previous stroke residual effect. Presently on Depakote  250 mg twice a day and Keppra  1000 mg twice a day Concerns of excessive daytime sleepiness secondary to medication Recommend to lower doses to once a day each Recent brain imaging report reviewed Will follow-up with neurologist

## 2023-12-05 NOTE — Assessment & Plan Note (Deleted)
 Clinically stable.  Most likely secondary to recent TIA or previous stroke residual effect.  Recently started on Depakote  500 mg daily Will follow-up with neurologist

## 2023-12-05 NOTE — Assessment & Plan Note (Signed)
 BP Readings from Last 3 Encounters:  12/05/23 (!) 142/82  11/19/23 130/85  10/21/23 126/82  Well-controlled hypertension with normal blood pressure readings at home Continues losartan  12.5 mg daily

## 2023-12-05 NOTE — Assessment & Plan Note (Signed)
 Secondary prevention measures discussed Not diabetic Well-controlled hypertension Presently on atorvastatin  40 mg daily Continues daily baby aspirin .  No longer on Brilinta  Scheduled for neurologist follow-up

## 2023-12-05 NOTE — Assessment & Plan Note (Signed)
 Presently on atorvastatin 40 mg daily.

## 2023-12-05 NOTE — Patient Instructions (Signed)
 Health Maintenance After Age 70 After age 70, you are at a higher risk for certain long-term diseases and infections as well as injuries from falls. Falls are a major cause of broken bones and head injuries in people who are older than age 70. Getting regular preventive care can help to keep you healthy and well. Preventive care includes getting regular testing and making lifestyle changes as recommended by your health care provider. Talk with your health care provider about: Which screenings and tests you should have. A screening is a test that checks for a disease when you have no symptoms. A diet and exercise plan that is right for you. What should I know about screenings and tests to prevent falls? Screening and testing are the best ways to find a health problem early. Early diagnosis and treatment give you the best chance of managing medical conditions that are common after age 70. Certain conditions and lifestyle choices may make you more likely to have a fall. Your health care provider may recommend: Regular vision checks. Poor vision and conditions such as cataracts can make you more likely to have a fall. If you wear glasses, make sure to get your prescription updated if your vision changes. Medicine review. Work with your health care provider to regularly review all of the medicines you are taking, including over-the-counter medicines. Ask your health care provider about any side effects that may make you more likely to have a fall. Tell your health care provider if any medicines that you take make you feel dizzy or sleepy. Strength and balance checks. Your health care provider may recommend certain tests to check your strength and balance while standing, walking, or changing positions. Foot health exam. Foot pain and numbness, as well as not wearing proper footwear, can make you more likely to have a fall. Screenings, including: Osteoporosis screening. Osteoporosis is a condition that causes  the bones to get weaker and break more easily. Blood pressure screening. Blood pressure changes and medicines to control blood pressure can make you feel dizzy. Depression screening. You may be more likely to have a fall if you have a fear of falling, feel depressed, or feel unable to do activities that you used to do. Alcohol  use screening. Using too much alcohol  can affect your balance and may make you more likely to have a fall. Follow these instructions at home: Lifestyle Do not drink alcohol  if: Your health care provider tells you not to drink. If you drink alcohol : Limit how much you have to: 0-1 drink a day for women. 0-2 drinks a day for men. Know how much alcohol  is in your drink. In the [place] one drink equals one 12 oz bottle of beer (355 mL), one 5 oz glass of wine (148 mL), or one 1 oz glass of hard liquor (44 mL). Do not use any products that contain nicotine or tobacco. These products include cigarettes, chewing tobacco, and vaping devices, such as e-cigarettes. If you need help quitting, ask your health care provider. Activity  Follow a regular exercise program to stay fit. This will help you maintain your balance. Ask your health care provider what types of exercise are appropriate for you. If you need a cane or walker, use it as recommended by your health care provider. Wear supportive shoes that have nonskid soles. Safety  Remove any tripping hazards, such as rugs, cords, and clutter. Install safety equipment such as grab bars in bathrooms and safety rails on stairs. Keep rooms and walkways  well-lit. General instructions Talk with your health care provider about your risks for falling. Tell your health care provider if: You fall. Be sure to tell your health care provider about all falls, even ones that seem minor. You feel dizzy, tiredness (fatigue), or off-balance. Take over-the-counter and prescription medicines only as told by your health care provider. These include  supplements. Eat a healthy diet and maintain a healthy weight. A healthy diet includes low-fat dairy products, low-fat (lean) meats, and fiber from whole grains, beans, and lots of fruits and vegetables. Stay current with your vaccines. Schedule regular health, dental, and eye exams. Summary Having a healthy lifestyle and getting preventive care can help to protect your health and wellness after age 70. Screening and testing are the best way to find a health problem early and help you avoid having a fall. Early diagnosis and treatment give you the best chance for managing medical conditions that are more common for people who are older than age 70. Falls are a major cause of broken bones and head injuries in people who are older than age 70. Take precautions to prevent a fall at home. Work with your health care provider to learn what changes you can make to improve your health and wellness and to prevent falls. This information is not intended to replace advice given to you by your health care provider. Make sure you discuss any questions you have with your health care provider. Document Revised: 05/09/2020 Document Reviewed: 05/09/2020 Elsevier Patient Education  2024 [employer].

## 2023-12-05 NOTE — Assessment & Plan Note (Signed)
 Multifactorial.  Presence of both functional and mental decline. Recommend to reassess in-home needs.  Referral placed today. Continues to be at risk for falls.  Fall precautions discussed with husband Recent blood work results reviewed. Medications contributing to daytime somnolence reviewed Doses and frequency adjusted Seems to be well-hydrated. Will check urinalysis and urine culture for infection Continues follow-up with neurologist.  May need referral to behavioral health.

## 2023-12-05 NOTE — Progress Notes (Signed)
 Jacqueline Bonilla 68 y.o.   Chief Complaint  Patient presents with   Follow-up    increased weakness, increased urination, and she is very emotional mobility is not good at all. Pt is very dehydrated     HISTORY OF PRESENT ILLNESS: This is a 68 y.o. female accompanied by husband who is her primary caregiver Concerned about both emotional and functional decline over the past several months Has been getting home health care for about 4 hours/day 5 days/week. Patient has had increased weakness over the past couple weeks.  Increased urination mostly at night.  Denies burning on urination. Still eating and drinking well. Went to the emergency department on 11/19/2023 for generalized weakness.  Unremarkable blood work.  Negative urinalysis. No other complaints or medical concerns  HPI   Prior to Admission medications   Medication Sig Start Date End Date Taking? Authorizing Provider  acetaminophen  (TYLENOL ) 325 MG tablet Take 650 mg by mouth every 4 (four) hours as needed for moderate pain (pain score 4-6).   Yes [provider]  aspirin  81 MG chewable tablet Chew 1 tablet (81 mg total) by mouth daily. 04/02/23  Yes Shafer, Jorene, NP  atorvastatin  (LIPITOR) 40 MG tablet Take 1 tablet (40 mg total) by mouth daily. 05/29/23  Yes Analiyah Lechuga, Emil Schanz, MD  Cinnamon  500 MG capsule Take 500 mg by mouth at bedtime.   Yes [provider]  Coenzyme Q10 (CO Q 10 PO) Take 1 capsule by mouth daily.   Yes [provider]  divalproex  (DEPAKOTE ) 250 MG DR tablet TAKE 1 TABLET(250 MG) BY MOUTH EVERY 12 HOURS 11/23/23  Yes Cortnie Ringel Jose, MD  Ferrous Sulfate (IRON PO) Take 365 mg by mouth at bedtime.   Yes [provider]  levETIRAcetam  (KEPPRA ) 1000 MG tablet Take 1 tablet (1,000 mg total) by mouth 2 (two) times daily. 10/21/23  Yes Ayodele Hartsock, Emil Schanz, MD  Multiple Vitamin (MULTIVITAMIN WITH MINERALS) TABS tablet Take 1 tablet by mouth at bedtime.   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   Frequent falls 02/11/2023   Statin intolerance 04/24/2022   Internal hemorrhoids 01/10/2021   Dyslipidemia 12/29/2020   Seizure-like activity (HCC) 08/30/2020   History of CVA (cerebrovascular accident) 07/14/2020   Normocytic anemia 07/11/2020   Vascular dementia without behavioral disturbance (HCC) 07/15/2019   History of COVID-19 04/22/2019   Essential hypertension 03/03/2019    Past Medical History:  Diagnosis Date   Acute ischemic left MCA stroke (HCC) 04/01/2023   right hemiparesis   Anxiety    Cognitive impairment    Depression    Dyslipidemia    Dysmenorrhea    Endometriosis    Fibroid    Frequent falls    Gait instability    Hypertension    Implantable loop recorder present 2021   Normocytic anemia    Osteoporosis    Seizure disorder (HCC)    Statin intolerance    Tachycardia  Vascular dementia [facility])     Past Surgical History:  Procedure Laterality Date   BREAST BIOPSY     BUBBLE STUDY  12/08/2020   Procedure: BUBBLE STUDY;  Surgeon: Delford Maude BROCKS, MD;  Location: [facility] ENDOSCOPY;  Service: Cardiovascular;;   CESAREAN SECTION     hysteroscopic resection     implantable loop recorder implant  10/21/2019   Medtronic Reveal Linq model LNQ 22 ([id number] [id number]) implantable loop recorder   IR CT HEAD LTD  12/01/2020   IR PERCUTANEOUS ART THROMBECTOMY/INFUSION INTRACRANIAL INC DIAG ANGIO  12/01/2020    IR US  GUIDE VASC ACCESS RIGHT  12/01/2020   ORIF HUMERUS FRACTURE Left 07/31/2021   Procedure: OPEN REDUCTION INTERNAL FIXATION (ORIF) DISTAL HUMERUS FRACTURE;  Surgeon: Kendal Franky SQUIBB, MD;  Location: [facility] OR;  Service: Orthopedics;  Laterality: Left;   RADIOLOGY WITH ANESTHESIA N/A 12/01/2020   Procedure: IR WITH ANESTHESIA - CODE STROKE;  Surgeon: Radiologist, Medication, MD;  Location: [facility] OR;  Service: Radiology;  Laterality: N/A;   TEE WITHOUT CARDIOVERSION N/A 12/08/2020   Procedure: TRANSESOPHAGEAL ECHOCARDIOGRAM (TEE);  Surgeon: Delford Maude BROCKS, MD;  Location: [facility] ENDOSCOPY;  Service: Cardiovascular;  Laterality: N/A;    Social History   Socioeconomic History   Marital status: Married    Spouse name: Actor   Number of children: 1   Years of education: Not on file   Highest education level: Not on file  Occupational History   Occupation: accounting   Occupation: Retired  Tobacco Use   Smoking status: Never   Smokeless tobacco: Never  Vaping Use   Vaping status: Never Used  Substance and Sexual Activity   Alcohol use: Never   Drug use: Never   Sexual activity: Not Currently    Partners: Male    Comment: husband vasectomy  Other Topics Concern   Not on file  Social History Narrative   Lives with husband   Grandchildren - 1   Works - Biochemist, 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%   Gun in home - yes - secured      Right handed   Caffeine: maybe tea every now and then   Social Drivers of [employer] Strain: Low Risk  (10/10/2023)   Overall Financial Resource Strain (CARDIA)    Difficulty of Paying Living Expenses: Not hard at all  Food Insecurity: No Food Insecurity (10/10/2023)   Hunger Vital Sign    Worried About Running Out of Food in the Last Year: Never true    Ran Out of Food in the Last Year: Never true  Transportation Needs: No Transportation Needs (10/10/2023)   PRAPARE - Administrator, Civil Service (Medical): No    Lack of Transportation  (Non-Medical): No  Physical Activity: Inactive (10/10/2023)   Exercise Vital Sign    Days of Exercise per Week: 0 days    Minutes of Exercise per Session: 0 min  Stress: Stress Concern Present (10/10/2023)   [employer] of Occupational Health - Occupational Stress Questionnaire    Feeling of Stress: Rather much  Social Connections: Socially Isolated (10/10/2023)   Social Connection and Isolation Panel    Frequency of Communication with Friends and Family: Never    Frequency of Social Gatherings with Friends and Family: Never    Attends Religious Services: Never    Database Administrator or Organizations: No    Attends Engineer, Structural: Not on file    Marital Status: Married  Intimate Partner Violence: Not At Risk (10/10/2023)   Humiliation, Afraid, Rape, and Kick questionnaire    Fear of Current or Ex-Partner: No    Emotionally Abused: No    Physically Abused: No    Sexually Abused: No    Family History  Problem Relation Age of Onset   Cancer Father        non hodgkin lymphoma & skin   Heart attack Maternal Grandfather    Dementia Mother    Polymyositis Sister      Review of Systems  Constitutional: Negative.  Negative for chills and fever.  HENT: Negative.  Negative for congestion and sore throat.   Respiratory: Negative.  Negative for cough and shortness of breath.   Cardiovascular: Negative.  Negative for chest pain and palpitations.  Gastrointestinal:  Negative for abdominal pain, diarrhea, nausea and vomiting.  Genitourinary:  Positive for frequency.  Neurological: Negative.  Negative for dizziness and headaches.  All other systems reviewed and are negative.   Vitals:   12/05/23 1037  BP: (!) 142/82  Pulse: (!) 122  Temp: 98 F (36.7 C)  SpO2: 98%    Physical Exam Vitals reviewed.  Constitutional:      Appearance: Normal appearance.  HENT:     Head: Normocephalic.     Mouth/Throat:     Mouth: Mucous membranes are moist.     Pharynx:  Oropharynx is clear.  Eyes:     Extraocular Movements: Extraocular movements intact.     Pupils: Pupils are equal, round, and reactive to light.  Cardiovascular:     Rate and Rhythm: Normal rate and regular rhythm.     Pulses: Normal pulses.     Heart sounds: Normal heart sounds.  Pulmonary:     Effort: Pulmonary effort is normal.     Breath sounds: Normal breath sounds.  Abdominal:     Palpations: Abdomen is soft.     Tenderness: There is no abdominal tenderness.  Musculoskeletal:     Cervical back: No tenderness.  Lymphadenopathy:     Cervical: No cervical adenopathy.  Skin:    General: Skin is warm and dry.  Neurological:     Mental Status: She is alert and oriented to person, place, and time. Mental status is at baseline.  Psychiatric:     Comments: Started crying shortly after starting interview      ASSESSMENT & PLAN: A total of 43 minutes was spent with the patient and counseling/coordination of care regarding preparing for this visit, review of most recent office visit notes, review of multiple chronic medical conditions and their management, review of all medications, review of most recent bloodwork results, review of health maintenance items, education on nutrition, prognosis, documentation, and need for follow up.   Problem List Items Addressed This Visit       Cardiovascular and Mediastinum   Essential hypertension   BP Readings from Last 3 Encounters:  12/05/23 (!) 142/82  11/19/23 130/85  10/21/23 126/82  Well-controlled hypertension with normal blood pressure readings at home Continues losartan  12.5 mg daily         Relevant Orders   Ambulatory referral to Home Health     Nervous and Auditory   Vascular dementia without behavioral disturbance (HCC)   Stable. At baseline. No concerns. Interacts with husband very well  However lately has developed emotional stability issues      Relevant Orders   Ambulatory referral to Home Health   Hemiparesis  affecting right side as late effect  of cerebrovascular accident (CVA) (HCC)   Is significantly affecting her ability to handle walker or cane Also affecting gait and stability Request for wheelchair was placed during last visits Getting occupational therapy at home      Relevant Orders   Ambulatory referral to Home Health     Other   General weakness - Primary   Multifactorial.  Presence of both functional and mental decline. Recommend to reassess in-home needs.  Referral placed today. Continues to be at risk for falls.  Fall precautions discussed with husband Recent blood work results reviewed. Medications contributing to daytime somnolence reviewed Doses and frequency adjusted Seems to be well-hydrated. Will check urinalysis and urine culture for infection Continues follow-up with neurologist.  May need referral to behavioral health.      History of CVA (cerebrovascular accident)   Secondary prevention measures discussed Not diabetic Well-controlled hypertension Presently on atorvastatin  40 mg daily Continues daily baby aspirin .  No longer on Brilinta  Scheduled for neurologist follow-up      Seizure-like activity [facility])   Relevant Orders   Ambulatory referral to Home Health   Dyslipidemia   Presently on atorvastatin  40 mg daily.       Relevant Orders   Ambulatory referral to Home Health   Seizure [facility])   Clinically stable.  Most likely secondary to recent TIA or previous stroke residual effect. Presently on Depakote  250 mg twice a day and Keppra  1000 mg twice a day Concerns of excessive daytime sleepiness secondary to medication Recommend to lower doses to once a day each Recent brain imaging report reviewed Will follow-up with neurologist      Other Visit Diagnoses       History of stroke       Relevant Orders   Ambulatory referral to Home Health     Nocturia       Relevant Orders   Urine Culture   Urinalysis      Patient Instructions  Health  Maintenance After Age 31 After age 55, you are at a higher risk for certain long-term diseases and infections as well as injuries from falls. Falls are a major cause of broken bones and head injuries in people who are older than age 50. Getting regular preventive care can help to keep you healthy and well. Preventive care includes getting regular testing and making lifestyle changes as recommended by your health care provider. Talk with your health care provider about: Which screenings and tests you should have. A screening is a test that checks for a disease when you have no symptoms. A diet and exercise plan that is right for you. What should I know about screenings and tests to prevent falls? Screening and testing are the best ways to find a health problem early. Early diagnosis and treatment give you the best chance of managing medical conditions that are common after age 101. Certain conditions and lifestyle choices may make you more likely to have a fall. Your health care provider may recommend: Regular vision checks. Poor vision and conditions such as cataracts can make you more likely to have a fall. If you wear glasses, make sure to get your prescription updated if your vision changes. Medicine review. Work with your health care provider to regularly review all of the medicines you are taking, including over-the-counter medicines. Ask your health care provider about any side effects that may make you more likely to have a fall. Tell your health care provider if any medicines that you take make you  feel dizzy or sleepy. Strength and balance checks. Your health care provider may recommend certain tests to check your strength and balance while standing, walking, or changing positions. Foot health exam. Foot pain and numbness, as well as not wearing proper footwear, can make you more likely to have a fall. Screenings, including: Osteoporosis screening. Osteoporosis is a condition that causes the bones  to get weaker and break more easily. Blood pressure screening. Blood pressure changes and medicines to control blood pressure can make you feel dizzy. Depression screening. You may be more likely to have a fall if you have a fear of falling, feel depressed, or feel unable to do activities that you used to do. Alcohol use screening. Using too much alcohol can affect your balance and may make you more likely to have a fall. Follow these instructions at home: Lifestyle Do not drink alcohol if: Your health care provider tells you not to drink. If you drink alcohol: Limit how much you have to: 0-1 drink a day for women. 0-2 drinks a day for men. Know how much alcohol is in your drink. In the [place] one drink equals one 12 oz bottle of beer (355 mL), one 5 oz glass of wine (148 mL), or one 1 oz glass of hard liquor (44 mL). Do not use any products that contain nicotine or tobacco. These products include cigarettes, chewing tobacco, and vaping devices, such as e-cigarettes. If you need help quitting, ask your health care provider. Activity  Follow a regular exercise program to stay fit. This will help you maintain your balance. Ask your health care provider what types of exercise are appropriate for you. If you need a cane or walker, use it as recommended by your health care provider. Wear supportive shoes that have nonskid soles. Safety  Remove any tripping hazards, such as rugs, cords, and clutter. Install safety equipment such as grab bars in bathrooms and safety rails on stairs. Keep rooms and walkways well-lit. General instructions Talk with your health care provider about your risks for falling. Tell your health care provider if: You fall. Be sure to tell your health care provider about all falls, even ones that seem minor. You feel dizzy, tiredness (fatigue), or off-balance. Take over-the-counter and prescription medicines only as told by your health care provider. These include  supplements. Eat a healthy diet and maintain a healthy weight. A healthy diet includes low-fat dairy products, low-fat (lean) meats, and fiber from whole grains, beans, and lots of fruits and vegetables. Stay current with your vaccines. Schedule regular health, dental, and eye exams. Summary Having a healthy lifestyle and getting preventive care can help to protect your health and wellness after age 100. Screening and testing are the best way to find a health problem early and help you avoid having a fall. Early diagnosis and treatment give you the best chance for managing medical conditions that are more common for people who are older than age 49. Falls are a major cause of broken bones and head injuries in people who are older than age 58. Take precautions to prevent a fall at home. Work with your health care provider to learn what changes you can make to improve your health and wellness and to prevent falls. This information is not intended to replace advice given to you by your health care provider. Make sure you discuss any questions you have with your health care provider. Document Revised: 05/09/2020 Document Reviewed: 05/09/2020 Elsevier Patient Education  2024 [employer].  Emil Schaumann, MD [facility]

## 2023-12-05 NOTE — Assessment & Plan Note (Addendum)
 Is significantly affecting her ability to handle walker or cane Also affecting gait and stability Request for wheelchair was placed during last visits Getting occupational therapy at home

## 2023-12-06 ENCOUNTER — Telehealth: Payer: Self-pay

## 2023-12-06 DIAGNOSIS — R351 Nocturia: Secondary | ICD-10-CM

## 2023-12-06 NOTE — Telephone Encounter (Signed)
 Copied from CRM [id number]. Topic: Clinical - Request for Lab/Test Order >> Dec 06, 2023  1:36 PM China J wrote: Reason for CRM: Patient's husband is on the line needing to know if the patient could come back with one of their aids to do another urine specimen since the one they came in for yesterday did not go to plan. The patient originally needed an aid to assist her with using the restroom and properly collecting the specimen but she did not have help which resulted in her missing the cup.   The patient could have a possible UTI and they are needing to get something collected for Dr. Sagardia before the weekend.  Please call Weslee Prestage at [phone].

## 2023-12-06 NOTE — Addendum Note (Signed)
 Addended by: ROSALVA LEX RAMAN on: 12/06/2023 02:20 PM   Modules accepted: Orders

## 2023-12-06 NOTE — Telephone Encounter (Signed)
 Copied from CRM [id number]. Topic: Referral - Status >> Dec 06, 2023 12:28 PM China J wrote: Reason for CRM: Dorthea calling from [facility] in regards to the placed referral from yesterday. She would like to inform Dr. Purcell that the patient is currently getting OT and aid from [facility] and she cannot receive care form 2 different Home Health facilities. The patient's husband said that the patient was discharged from physical therapy through [facility].

## 2023-12-06 NOTE — Telephone Encounter (Unsigned)
 Copied from CRM [id number]. Topic: Appointments - Appointment Scheduling >> Dec 06, 2023  3:30 PM Berneda FALCON wrote: Patient's spouse, [name] calling to see if new order was placed for urine test, I let him know it was placed (per chart and CRM), and attempted to schedule this lab for patient, but he did not have the aide schedule in front of him in order to schedule around that. He was informed to please give us  a call back when he knows the schedule, and we would be happy to put her on the schedule for Monday (his requested day).

## 2023-12-06 NOTE — Telephone Encounter (Signed)
 I tried calling back and was unable to leave voicemail due it being full as well as in I have replaced the orders for patient to come back and do

## 2023-12-09 NOTE — Telephone Encounter (Signed)
 FYI

## 2023-12-09 NOTE — Telephone Encounter (Signed)
 Okay.  Thanks.

## 2023-12-09 NOTE — Telephone Encounter (Signed)
 I have called the patient and was unable to LVM to let inform him to get patient scheduled for the lab today

## 2023-12-12 ENCOUNTER — Ambulatory Visit: Payer: Self-pay | Admitting: Neurology

## 2023-12-12 ENCOUNTER — Other Ambulatory Visit: Payer: Self-pay

## 2023-12-12 ENCOUNTER — Other Ambulatory Visit: Payer: Self-pay | Admitting: Neurology

## 2023-12-12 ENCOUNTER — Other Ambulatory Visit (HOSPITAL_COMMUNITY): Payer: Self-pay | Admitting: Pharmacist

## 2023-12-12 DIAGNOSIS — G40009 Localization-related (focal) (partial) idiopathic epilepsy and epileptic syndromes with seizures of localized onset, not intractable, without status epilepticus: Secondary | ICD-10-CM

## 2023-12-12 DIAGNOSIS — F0153 Vascular dementia, unspecified severity, with mood disturbance: Secondary | ICD-10-CM | POA: Diagnosis not present

## 2023-12-12 DIAGNOSIS — I10 Essential (primary) hypertension: Secondary | ICD-10-CM | POA: Diagnosis not present

## 2023-12-12 DIAGNOSIS — I69390 Apraxia following cerebral infarction: Secondary | ICD-10-CM | POA: Diagnosis not present

## 2023-12-12 DIAGNOSIS — E440 Moderate protein-calorie malnutrition: Secondary | ICD-10-CM | POA: Diagnosis not present

## 2023-12-12 DIAGNOSIS — D649 Anemia, unspecified: Secondary | ICD-10-CM | POA: Diagnosis not present

## 2023-12-12 DIAGNOSIS — R569 Unspecified convulsions: Secondary | ICD-10-CM

## 2023-12-12 DIAGNOSIS — Z8673 Personal history of transient ischemic attack (TIA), and cerebral infarction without residual deficits: Secondary | ICD-10-CM

## 2023-12-12 DIAGNOSIS — I69351 Hemiplegia and hemiparesis following cerebral infarction affecting right dominant side: Secondary | ICD-10-CM | POA: Diagnosis not present

## 2023-12-12 DIAGNOSIS — I69320 Aphasia following cerebral infarction: Secondary | ICD-10-CM | POA: Diagnosis not present

## 2023-12-12 DIAGNOSIS — E785 Hyperlipidemia, unspecified: Secondary | ICD-10-CM | POA: Diagnosis not present

## 2023-12-12 DIAGNOSIS — I69354 Hemiplegia and hemiparesis following cerebral infarction affecting left non-dominant side: Secondary | ICD-10-CM | POA: Diagnosis not present

## 2023-12-12 DIAGNOSIS — G40901 Epilepsy, unspecified, not intractable, with status epilepticus: Secondary | ICD-10-CM | POA: Diagnosis not present

## 2023-12-12 DIAGNOSIS — F0154 Vascular dementia, unspecified severity, with anxiety: Secondary | ICD-10-CM | POA: Diagnosis not present

## 2023-12-12 DIAGNOSIS — F32A Depression, unspecified: Secondary | ICD-10-CM | POA: Diagnosis not present

## 2023-12-12 DIAGNOSIS — Z0289 Encounter for other administrative examinations: Secondary | ICD-10-CM

## 2023-12-12 MED ORDER — DIVALPROEX SODIUM 250 MG PO DR TAB: 500.0000 mg | DELAYED_RELEASE_TABLET | ORAL | 1 refills | Status: DC

## 2023-12-12 MED ORDER — LEVETIRACETAM 500 MG PO TABS: 500.0000 mg | ORAL_TABLET | Freq: Two times a day (BID) | ORAL | 3 refills | Status: AC

## 2023-12-12 MED ORDER — DIVALPROEX SODIUM 250 MG PO DR TAB: 250.0000 mg | DELAYED_RELEASE_TABLET | ORAL | 1 refills | Status: DC

## 2023-12-12 NOTE — Progress Notes (Signed)
 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  Patient was admitted in June 2025 with complex partial seizures started on Keppra .  She was subsequently admitted in September 2025 status epilepticus quiring intubation and prolonged hospital stay.  Depakote  was added to the Keppra .  Patient was transferred to skilled nursing facility.  However librexia stroke trial medication was discontinued with nursing facility since they did not feel comfortable using investigational.  Patient has not been discharged and living home for a month but unfortunately has had a gradual functional decline over the past several months.  She is currently getting home health care with her generalized weakness and increased urination particularly at night.  Patient does also have a lot of fatigue.  She was seen in the emergency room on 11/19/2023 for generalized weakness.  Lab work and urine analysis were unremarkable.  Patient was seen by her primary care physician Dr. Purcell on 12/4//25 she was monitored for 250 mg twice daily and Keppra  1000 mg twice daily with concerns of excessive daytime sleepiness was recommended to lower both medications to once a day.  Her husband has not noticed any significant improvement after making this change.  He has also not noticed any definite like activity. Physical exam shows frail elderly Caucasian lady sitting in wheelchair.  Not in distress. She is awake alert oriented to person only.  Diminished attention, registration and recall.  No aphasia or apraxia.  Follows simple midline and one-step commands.  Extraocular movements are full range without nystagmus.  Left partial homonymous hemianopsia mild left lower facial weakness.  Tongue midline. Motor system exam shows no upper extremity drift but weakness of left grip and intrinsic hand muscles.  Orbits lateral left upper extremity.  Bilateral lower extremity mild  proximal weakness with.  Weakness of left ankle dorsiflexors.  Intact bilaterally.  Action tremor of both outstretched upper extremities which are absent at rest.  No cogwheel rigidity.  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  Patient participating in the Librexia stroke prevention trial but has been off medication since June 2025  PLAN : Recommend   check CMP, CBC, ammonia, valproic  acid, TSH and Keppra  levels today.  Check EEG.  Agree with reducing dose of Keppra  to 500 mg twice daily Depakote  ER 250 mg daily.  Continue aspirin  for stroke prevention.  Resume Librexia stroke trial medication as patient is not at home.  Continue ongoing physical and occupational therapies and gait training.  Long discussion with patient and husband and answered questions.  Continue follow-up as per  [facility] stroke protocol   Eather Popp, MD

## 2023-12-13 ENCOUNTER — Telehealth: Payer: Self-pay

## 2023-12-13 NOTE — Telephone Encounter (Signed)
 My recommendation is to continue PT at home but it's up to the group providing it. Thanks.

## 2023-12-13 NOTE — Telephone Encounter (Signed)
 Please advise

## 2023-12-13 NOTE — Telephone Encounter (Signed)
 Copied from CRM [id number]. Topic: Clinical - Medical Advice >> Dec 13, 2023  2:00 PM Dedra B wrote: Reason for CRM: Pt's husband, [name], would like Dr. Lebron advice regarding his wife's PT. He said that [facility] terminated her home PT because they felt she was competent enough for PT at a facility. However, he said they caught her on a good day. [facility] and [facility] at [facility] have reached out. He also said pt is doing much better and her walking has improved with new dosage of Depakote  and Keppra . Pls call [name].

## 2023-12-15 ENCOUNTER — Ambulatory Visit: Payer: Self-pay | Admitting: Neurology

## 2023-12-15 NOTE — Progress Notes (Signed)
 Kindly advise the patient that lab work for Keppra  and valproate acid levels are not back yet.  Blood chemistries and blood cell count and ammonia and TSH were normal

## 2023-12-18 LAB — COMPREHENSIVE METABOLIC PANEL WITH GFR
ALT: 16 IU/L (ref 0–32)
AST: 18 IU/L (ref 0–40)
Albumin: 4.4 g/dL (ref 3.9–4.9)
Alkaline Phosphatase: 84 IU/L (ref 49–135)
BUN/Creatinine Ratio: 17 (ref 12–28)
BUN: 15 mg/dL (ref 8–27)
Bilirubin Total: 0.4 mg/dL (ref 0.0–1.2)
CO2: 27 mmol/L (ref 20–29)
Calcium: 9.5 mg/dL (ref 8.7–10.3)
Chloride: 99 mmol/L (ref 96–106)
Creatinine, Ser: 0.87 mg/dL (ref 0.57–1.00)
Globulin, Total: 3.3 g/dL (ref 1.5–4.5)
Glucose: 93 mg/dL (ref 70–99)
Potassium: 4.2 mmol/L (ref 3.5–5.2)
Sodium: 139 mmol/L (ref 134–144)
Total Protein: 7.7 g/dL (ref 6.0–8.5)
eGFR: 72 mL/min/1.73 (ref >59)

## 2023-12-18 LAB — CBC WITH DIFFERENTIAL/PLATELET
Basophils Absolute: 0.1 x10E3/uL (ref 0.0–0.2)
Basos: 1 %
EOS (ABSOLUTE): 0.1 x10E3/uL (ref 0.0–0.4)
Eos: 2 %
Hematocrit: 38.9 % (ref 34.0–46.6)
Hemoglobin: 12.5 g/dL (ref 11.1–15.9)
Immature Grans (Abs): 0 x10E3/uL (ref 0.0–0.1)
Immature Granulocytes: 0 %
Lymphocytes Absolute: 1.3 x10E3/uL (ref 0.7–3.1)
Lymphs: 29 %
MCH: 30.4 pg (ref 26.6–33.0)
MCHC: 32.1 g/dL (ref 31.5–35.7)
MCV: 95 fL (ref 79–97)
Monocytes Absolute: 0.5 x10E3/uL (ref 0.1–0.9)
Monocytes: 11 %
Neutrophils Absolute: 2.4 x10E3/uL (ref 1.4–7.0)
Neutrophils: 57 %
Platelets: 239 x10E3/uL (ref 150–450)
RBC: 4.11 x10E6/uL (ref 3.77–5.28)
RDW: 13.3 % (ref 11.7–15.4)
WBC: 4.3 x10E3/uL (ref 3.4–10.8)

## 2023-12-18 LAB — AMMONIA: Ammonia: 46 ug/dL (ref 34–178)

## 2023-12-18 LAB — LEVETIRACETAM LEVEL: Levetiracetam Lvl: 4.3 ug/mL — AB (ref 10.0–40.0)

## 2023-12-18 LAB — VALPROIC ACID LEVEL

## 2023-12-18 LAB — TSH: TSH: 1.84 u[IU]/mL (ref 0.450–4.500)

## 2023-12-19 ENCOUNTER — Other Ambulatory Visit: Admitting: *Deleted

## 2023-12-19 DIAGNOSIS — G40009 Localization-related (focal) (partial) idiopathic epilepsy and epileptic syndromes with seizures of localized onset, not intractable, without status epilepticus: Secondary | ICD-10-CM

## 2023-12-23 ENCOUNTER — Other Ambulatory Visit: Payer: Self-pay | Admitting: Emergency Medicine

## 2023-12-23 ENCOUNTER — Encounter

## 2023-12-23 DIAGNOSIS — Z8673 Personal history of transient ischemic attack (TIA), and cerebral infarction without residual deficits: Secondary | ICD-10-CM

## 2023-12-23 DIAGNOSIS — R569 Unspecified convulsions: Secondary | ICD-10-CM

## 2023-12-23 NOTE — Progress Notes (Signed)
 Kindly inform the patient that most of the lab work Back Is Satisfactory.  Keppra  Level Is Low but if the Patient Is Not Having Any Breakthrough Seizures Will Continue the Current Medication.  Valproic  Acid Level was cancelled for unclear reason EEG showed slowing of brainwave activity but no ongoing seizures.

## 2023-12-24 NOTE — Telephone Encounter (Addendum)
 Called pt's Husband (on DPR) and let him know the below results per Dr. Rosemarie. Pt's husband verbalized understanding,and thanked me for calling.   ----- Message from Nurse Heather BROCKS, RN sent at 12/23/2023  3:35 PM EST -----  ----- Message ----- From: Rosemarie Eather RAMAN, MD Sent: 12/23/2023   9:07 AM EST To: Emil Aloysius Schaumann, MD; Gna-Pod 3 Results  Kindly inform the patient that most of the lab work Back Is Satisfactory.  Keppra  Level Is Low but if the Patient Is Not Having Any Breakthrough Seizures Will Continue the Current Medication.   Valproic  Acid Level was cancelled for unclear reason EEG showed slowing of brainwave activity but no ongoing seizures.

## 2023-12-25 ENCOUNTER — Ambulatory Visit: Attending: Emergency Medicine

## 2023-12-26 ENCOUNTER — Encounter

## 2023-12-27 ENCOUNTER — Ambulatory Visit: Payer: Self-pay | Admitting: *Deleted

## 2023-12-27 NOTE — Telephone Encounter (Signed)
 FYI Only or Action Required?: Action required by provider: request for appointment and clinical question for provider.  Patient was last seen in primary care on 12/05/2023 by Jacqueline Emil Schanz, MD.  Called Nurse Triage reporting Dizziness.  Symptoms began today.  Interventions attempted: Rest, hydration, or home remedies.  Symptoms are: unchanged.  Triage Disposition: See Physician Within 24 Hours  Patient/caregiver understands and will follow disposition?: No, wishes to speak with PCP     Patient's husband reported episode of dizziness after exercise/ change in position and required couple of minutes to lay down before feeling less dizziness / room spinning. Bilateral foot swelling no chest pain no difficulty breathing. Please advise regarding scheduling appt before Jan. None available with PCP before Jan. Patient's husband with multiple medication questions.           Reason for Disposition  Taking a medicine that could cause dizziness (e.g., blood pressure medications, diuretics)  Answer Assessment - Initial Assessment Questions No available appt until Jan. Recommended husband to notify neurologist for medication questions and adjustments.  Recommended Patient's husband  to take patient to ED for evaluation if sx worsening. Please advise regarding appt. Husband requesting call back .        1. DESCRIPTION: Describe your dizziness.     Room spinning after exercising  2. LIGHTHEADED: Do you feel lightheaded? (e.g., somewhat faint, woozy, weak upon standing)     Just needs to sit down 3. VERTIGO: Do you feel like either you or the room is spinning or tilting? (i.e., vertigo)     Room spinning  4. SEVERITY: How bad is it?  Do you feel like you are going to faint? Can you stand and walk?     Husband report stiffer legs with walking  5. ONSET:  When did the dizziness begin?     Today  6. AGGRAVATING FACTORS: Does anything make it worse? (e.g.,  standing, change in head position)     Change in head position 7. HEART RATE: Can you tell me your heart rate? How many beats in 15 seconds?  (Note: Not all patients can do this.)       Na  8. CAUSE: What do you think is causing the dizziness? (e.g., decreased fluids or food, diarrhea, emotional distress, heat exposure, new medicine, sudden standing, vomiting; unknown)     Able to eat and drink, medication decreased and unsure if causing intermittent dizziness 9. RECURRENT SYMPTOM: Have you had dizziness before? If Yes, ask: When was the last time? What happened that time?     Since starting depakote  10. OTHER SYMPTOMS: Do you have any other symptoms? (e.g., fever, chest pain, vomiting, diarrhea, bleeding)       No chest pain , no difficulty breathing, no vomiting, dizziness, room spinning after exercising. Bilateral feet swelling. Taking couple of minutes for dizziness to decrease. 11. PREGNANCY: Is there any chance you are pregnant? When was your last menstrual period?       na  Protocols used: Dizziness - Lightheadedness-A-AH

## 2023-12-27 NOTE — Telephone Encounter (Signed)
Please advise as MD is out of office

## 2023-12-31 ENCOUNTER — Telehealth: Payer: Self-pay | Admitting: Emergency Medicine

## 2023-12-31 ENCOUNTER — Ambulatory Visit

## 2023-12-31 ENCOUNTER — Other Ambulatory Visit: Payer: Self-pay | Admitting: Neurology

## 2023-12-31 DIAGNOSIS — R41844 Frontal lobe and executive function deficit: Secondary | ICD-10-CM | POA: Diagnosis present

## 2023-12-31 DIAGNOSIS — R41842 Visuospatial deficit: Secondary | ICD-10-CM | POA: Insufficient documentation

## 2023-12-31 DIAGNOSIS — R2689 Other abnormalities of gait and mobility: Secondary | ICD-10-CM | POA: Insufficient documentation

## 2023-12-31 DIAGNOSIS — R41840 Attention and concentration deficit: Secondary | ICD-10-CM | POA: Diagnosis present

## 2023-12-31 DIAGNOSIS — M6281 Muscle weakness (generalized): Secondary | ICD-10-CM | POA: Diagnosis present

## 2023-12-31 DIAGNOSIS — R29818 Other symptoms and signs involving the nervous system: Secondary | ICD-10-CM | POA: Diagnosis present

## 2023-12-31 DIAGNOSIS — Z8673 Personal history of transient ischemic attack (TIA), and cerebral infarction without residual deficits: Secondary | ICD-10-CM | POA: Insufficient documentation

## 2023-12-31 DIAGNOSIS — I69351 Hemiplegia and hemiparesis following cerebral infarction affecting right dominant side: Secondary | ICD-10-CM | POA: Insufficient documentation

## 2023-12-31 DIAGNOSIS — R278 Other lack of coordination: Secondary | ICD-10-CM | POA: Insufficient documentation

## 2023-12-31 DIAGNOSIS — R2681 Unsteadiness on feet: Secondary | ICD-10-CM | POA: Insufficient documentation

## 2023-12-31 DIAGNOSIS — R569 Unspecified convulsions: Secondary | ICD-10-CM

## 2023-12-31 MED ORDER — DIVALPROEX SODIUM 250 MG PO DR TAB: 250.0000 mg | DELAYED_RELEASE_TABLET | ORAL | 1 refills | Status: AC

## 2023-12-31 NOTE — Telephone Encounter (Signed)
 Copied from CRM #[id number]. Topic: Clinical - Red Word Triage >> Dec 27, 2023 12:34 PM Jacqueline Bonilla wrote: [facility] that prompted transfer to Nurse Triage: Husband Jacqueline Bonilla calling in because provider cut down divalproex  (DEPAKOTE ) 250 MG DR tablet she was doing better but now they came back stronger(seizers and symptoms) . Bilateral legs are also swollen, overall all her health and walking is doing worst.

## 2023-12-31 NOTE — Telephone Encounter (Signed)
 Recommend to reach out to neurologist.

## 2023-12-31 NOTE — Therapy (Addendum)
 " OUTPATIENT PHYSICAL THERAPY NEURO EVALUATION   Patient Name: Jacqueline Bonilla MRN: [id number] DOB:1955/08/26, 68 y.o., female Today's Date: 12/31/2023  PCP: Purcell Emil Schanz, MD REFERRING PROVIDER: Purcell Emil Schanz, MD  END OF SESSION:  PT End of Session - 12/31/23 1413     Visit Number 1    Date for Recertification  03/24/24    Authorization Type [employer]    PT Start Time 1016    PT Stop Time 1100    PT Time Calculation (min) 44 min    Equipment Utilized During Treatment Gait belt    Activity Tolerance Patient tolerated treatment well    Behavior During Therapy [facility] for tasks assessed/performed         Past Medical History:  Diagnosis Date   Acute ischemic left MCA stroke (HCC) 04/01/2023   right hemiparesis   Anxiety    Cognitive impairment    Depression    Dyslipidemia    Dysmenorrhea    Endometriosis    Fibroid    Frequent falls    Gait instability    Hypertension    Implantable loop recorder present 2021   Normocytic anemia    Osteoporosis    Seizure disorder (HCC)    Statin intolerance    Tachycardia    Vascular dementia [facility])    Past Surgical History:  Procedure Laterality Date   BREAST BIOPSY     BUBBLE STUDY  12/08/2020   Procedure: BUBBLE STUDY;  Surgeon: Delford Maude BROCKS, MD;  Location: [facility] ENDOSCOPY;  Service: Cardiovascular;;   CESAREAN SECTION     hysteroscopic resection     implantable loop recorder implant  10/21/2019   Medtronic Reveal Linq model LNQ 22 ([place] [id number]) implantable loop recorder   IR CT HEAD LTD  12/01/2020   IR PERCUTANEOUS ART THROMBECTOMY/INFUSION INTRACRANIAL INC DIAG ANGIO  12/01/2020   IR US  GUIDE VASC ACCESS RIGHT  12/01/2020   ORIF HUMERUS FRACTURE Left 07/31/2021   Procedure: OPEN REDUCTION INTERNAL FIXATION (ORIF) DISTAL HUMERUS FRACTURE;  Surgeon: Kendal Franky SHAUNNA, MD;  Location: [facility] OR;  Service: Orthopedics;  Laterality: Left;   RADIOLOGY WITH ANESTHESIA N/A 12/01/2020   Procedure: IR WITH ANESTHESIA  - CODE STROKE;  Surgeon: Radiologist, Medication, MD;  Location: [facility] OR;  Service: Radiology;  Laterality: N/A;   TEE WITHOUT CARDIOVERSION N/A 12/08/2020   Procedure: TRANSESOPHAGEAL ECHOCARDIOGRAM (TEE);  Surgeon: Delford Maude BROCKS, MD;  Location: [facility] ENDOSCOPY;  Service: Cardiovascular;  Laterality: N/A;   Patient Active Problem List   Diagnosis Date Noted   Pressure injury of skin 08/19/2023   Malnutrition of moderate degree 08/16/2023   Seizure (HCC) 08/15/2023   Gait disturbance, 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   Hemiparesis affecting right side as late effect of cerebrovascular accident (CVA) (HCC) 04/23/2023   Apraxia, 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   Frequent falls 02/11/2023   Statin intolerance 04/24/2022   Internal hemorrhoids 01/10/2021   Dyslipidemia 12/29/2020   Seizure-like activity (HCC) 08/30/2020   History of CVA (cerebrovascular accident) 07/14/2020   Normocytic anemia 07/11/2020   General weakness 06/24/2020   Vascular dementia without behavioral disturbance (HCC) 07/15/2019   Essential hypertension 03/03/2019    ONSET DATE: Chronic  REFERRING DIAG: Z86.73 - Hx of Stroke  THERAPY DIAG:  Muscle weakness (generalized)  Visuospatial deficit  Other lack of coordination  Attention and concentration deficit  Unsteadiness on feet  Hemiplegia and hemiparesis following cerebral infarction affecting right dominant side (HCC)  Frontal lobe and executive function deficit  Other symptoms and signs involving the  nervous system  Other abnormalities of gait and mobility  Rationale for Evaluation and Treatment: Rehabilitation  SUBJECTIVE:                                                                                                                                                                                             SUBJECTIVE STATEMENT: Pt husband stated he assists her in all daily tasks including transfers, dressing, and toileting. Pt husband stated previous aquatic therapy  helped in her mobility and did receive home therapy for a time.  Pt accompanied by: significant other  PERTINENT HISTORY: See PMH chart above; noted seizure and stroke hx  PAIN:  No pain reported   PRECAUTIONS: None  RED FLAGS: None   WEIGHT BEARING RESTRICTIONS: No  FALLS: Has patient fallen in last 6 months? Yes. Number of falls 12  LIVING ENVIRONMENT: Lives with: lives with their family and lives with their spouse Lives in: House/apartment Stairs: Yes: External: 4 steps; Bilateral Has following equipment at home: Vannie - 2 wheeled and Wheelchair (manual)  PLOF: Needs assistance with ADLs, Needs assistance with homemaking, Needs assistance with gait, and Needs assistance with transfers  PATIENT GOALS: Pt husband agreeable that functional mobility improvement in order to make day to day tasks easier for him and pt to perform is the biggest goal.   OBJECTIVE:  Note: Objective measures were completed at Evaluation unless otherwise noted.  DIAGNOSTIC FINDINGS: CLINICAL HISTORY: weakness   FINDINGS:   LUNGS AND PLEURA: No focal pulmonary opacity. No pleural effusion. No pneumothorax.   HEART AND MEDIASTINUM: No acute abnormality of the cardiac and mediastinal silhouettes.   BONES AND SOFT TISSUES: No acute osseous abnormality.   IMPRESSION: 1. No acute cardiopulmonary process.   Electronically signed by: Lynwood Seip MD 11/19/2023 01:56 PM EST RP  COGNITION: Overall cognitive status: History of cognitive impairments - at baseline   SENSATION: WFL  COORDINATION: Finger to nose: unable to achieve on LUE  Increased time to perform on R side with past-pointing  EDEMA:  Visible swelling in bilateral ankles   POSTURE: rounded shoulders  LOWER EXTREMITY ROM:     Active  Right Eval Left Eval  Hip flexion WNL WNL  Hip extension    Hip abduction WNL WNL  Hip adduction WNL WNL  Hip internal rotation    Hip external rotation    Knee flexion WNL WNL  Knee  extension WNL WNL  Ankle dorsiflexion    Ankle plantarflexion    Ankle inversion    Ankle eversion     (Blank rows = not tested)  LOWER EXTREMITY MMT:    Shoulder  flex- 3+/5 bilaterally  MMT Right Eval Left Eval  Hip flexion 3+/5 3+/5  Hip extension    Hip abduction    Hip adduction    Hip internal rotation    Hip external rotation    Knee flexion 3+/5 3+/5  Knee extension 4-/5 4-/5  Ankle dorsiflexion    Ankle plantarflexion    Ankle inversion    Ankle eversion    (Blank rows = not tested)  TRANSFERS: Sit to stand: Min A  Assistive device utilized: Environmental Consultant - 2 wheeled       GAIT: Findings: Distance walked: In Clinic and Comments: ModA required for RW navigation and ambulation; pt presents with L sided neglect   FUNCTIONAL TESTS:   Plan to perform TUG                                                                                                                                TREATMENT DATE:   12/31/23- Eval    PATIENT EDUCATION: Education details: Discussion on current HEP pt is performing  Person educated: Patient and Spouse Education method: Explanation Education comprehension: verbalized understanding  HOME EXERCISE PROGRAM: Plan to add to current HEP given from previous PT  GOALS: Goals reviewed with patient? Yes  SHORT TERM GOALS: Target date: 02/11/24  Pt will be able to perform all initial HEP once given with assist from spouse  Baseline: Goal status: INITIAL    LONG TERM GOALS: Target date: 03/25/23  Pt will be able to ambulate 100' with LRAD utilizing CGA in order to progress household mobility Baseline:  Goal status: INITIAL  2.  Pt will be able to perform and complete TUG <20s utilizing minA Baseline:  Goal status: INITIAL  3.  Pt will perform STS, chair to chair. And car transfers utilizing minA with ability to use bilateral UE on RW Baseline:  Goal status: INITIAL  4.  Pt will be able to navigate stairs in order to enter home  with railing use utilizing CGA Baseline:  Goal status: INITIAL   ASSESSMENT:  CLINICAL IMPRESSION:  Patient is a 68 y.o. female who was seen today for physical therapy evaluation and treatment for residual affects from previous stroke history. She enters in a wheelchair. Pt with deficits in transfers and general mobility with frequent hx of repeated falls. Pt noted to be impulsive with movements and also has deficits in safety awareness. Pt unable to navigate with RW independently. Pt presents with L neglect and has problems with movement initiation. Pt exhibits a shuffling gait upon initiation and required modA to ambulate. Pt unable to visualize L hand to place onto walker; requires tactile and verbal cues. She will benefit from PT to improve functional mobility to allow ease with ADLs and transfers. Planned to get OT referral to work on left-right coordination, tremors, and apraxia.   OBJECTIVE IMPAIRMENTS: Abnormal gait, decreased balance, decreased cognition, decreased coordination, decreased endurance, decreased mobility, difficulty walking, decreased  strength, and decreased safety awareness.   ACTIVITY LIMITATIONS: carrying, lifting, bending, stairs, transfers, bed mobility, toileting, dressing, and self feeding  PARTICIPATION LIMITATIONS: meal prep, cleaning, laundry, medication management, driving, and community activity  PERSONAL FACTORS: Age, Past/current experiences, Time since onset of injury/illness/exacerbation, and 1-2 comorbidities:   are also affecting patient's functional outcome.   REHAB POTENTIAL: Good  CLINICAL DECISION MAKING: Evolving/moderate complexity  EVALUATION COMPLEXITY: Moderate  PLAN:  PT FREQUENCY: 2x/week  PT DURATION: 12 weeks  PLANNED INTERVENTIONS: 97110-Therapeutic exercises, 97530- Therapeutic activity, [id number]- Neuromuscular re-education, 97535- Self Care, [id number]- Manual therapy, [phone]- Gait training, and Patient/Family education  PLAN FOR NEXT  SESSION: General muscle strengthening, transfer training, pre-gait activities.    [place], PT 12/31/2023, 3:18 PM  "

## 2024-01-08 ENCOUNTER — Encounter: Payer: Self-pay | Admitting: Physical Therapy

## 2024-01-08 ENCOUNTER — Ambulatory Visit: Attending: Emergency Medicine | Admitting: Physical Therapy

## 2024-01-08 DIAGNOSIS — R278 Other lack of coordination: Secondary | ICD-10-CM | POA: Insufficient documentation

## 2024-01-08 DIAGNOSIS — M6281 Muscle weakness (generalized): Secondary | ICD-10-CM | POA: Insufficient documentation

## 2024-01-08 DIAGNOSIS — I69351 Hemiplegia and hemiparesis following cerebral infarction affecting right dominant side: Secondary | ICD-10-CM | POA: Insufficient documentation

## 2024-01-08 DIAGNOSIS — R29818 Other symptoms and signs involving the nervous system: Secondary | ICD-10-CM | POA: Diagnosis present

## 2024-01-08 DIAGNOSIS — R41842 Visuospatial deficit: Secondary | ICD-10-CM | POA: Diagnosis present

## 2024-01-08 DIAGNOSIS — R2689 Other abnormalities of gait and mobility: Secondary | ICD-10-CM | POA: Insufficient documentation

## 2024-01-08 DIAGNOSIS — R2681 Unsteadiness on feet: Secondary | ICD-10-CM | POA: Insufficient documentation

## 2024-01-08 DIAGNOSIS — R41844 Frontal lobe and executive function deficit: Secondary | ICD-10-CM | POA: Insufficient documentation

## 2024-01-08 NOTE — Therapy (Signed)
 " OUTPATIENT PHYSICAL THERAPY NEURO TREATMENT   Patient Name: Jacqueline Bonilla MRN: [id number] DOB:[birth year], 69 y.o., female Today's Date: 01/08/2024  PCP: Purcell Emil Schanz, MD REFERRING PROVIDER: Purcell Emil Schanz, MD  END OF SESSION:  PT End of Session - 01/08/24 1057     Visit Number 2    Date for Recertification  03/24/24    Authorization Type [employer] 2 of 24    PT Start Time 1057    PT Stop Time 1145    PT Time Calculation (min) 48 min    Equipment Utilized During Treatment Gait belt    Activity Tolerance Patient tolerated treatment well    Behavior During Therapy [facility] for tasks assessed/performed         Past Medical History:  Diagnosis Date   Acute ischemic left MCA stroke (HCC) 04/01/2023   right hemiparesis   Anxiety    Cognitive impairment    Depression    Dyslipidemia    Dysmenorrhea    Endometriosis    Fibroid    Frequent falls    Gait instability    Hypertension    Implantable loop recorder present 2021   Normocytic anemia    Osteoporosis    Seizure disorder (HCC)    Statin intolerance    Tachycardia    Vascular dementia [facility])    Past Surgical History:  Procedure Laterality Date   BREAST BIOPSY     BUBBLE STUDY  12/08/2020   Procedure: BUBBLE STUDY;  Surgeon: Delford Maude BROCKS, MD;  Location: [facility] ENDOSCOPY;  Service: Cardiovascular;;   CESAREAN SECTION     hysteroscopic resection     implantable loop recorder implant  10/21/2019   Medtronic Reveal Linq model LNQ 22 ([place] [id number]) implantable loop recorder   IR CT HEAD LTD  12/01/2020   IR PERCUTANEOUS ART THROMBECTOMY/INFUSION INTRACRANIAL INC DIAG ANGIO  12/01/2020   IR US  GUIDE VASC ACCESS RIGHT  12/01/2020   ORIF HUMERUS FRACTURE Left 07/31/2021   Procedure: OPEN REDUCTION INTERNAL FIXATION (ORIF) DISTAL HUMERUS FRACTURE;  Surgeon: Kendal Franky SHAUNNA, MD;  Location: [facility] OR;  Service: Orthopedics;  Laterality: Left;   RADIOLOGY WITH ANESTHESIA N/A 12/01/2020   Procedure: IR WITH  ANESTHESIA - CODE STROKE;  Surgeon: Radiologist, Medication, MD;  Location: [facility] OR;  Service: Radiology;  Laterality: N/A;   TEE WITHOUT CARDIOVERSION N/A 12/08/2020   Procedure: TRANSESOPHAGEAL ECHOCARDIOGRAM (TEE);  Surgeon: Delford Maude BROCKS, MD;  Location: [facility] ENDOSCOPY;  Service: Cardiovascular;  Laterality: N/A;   Patient Active Problem List   Diagnosis Date Noted   Pressure injury of skin 08/19/2023   Malnutrition of moderate degree 08/16/2023   Seizure (HCC) 08/15/2023   Gait disturbance, 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   Hemiparesis affecting right side as late effect of cerebrovascular accident (CVA) (HCC) 04/23/2023   Apraxia, 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   Frequent falls 02/11/2023   Statin intolerance 04/24/2022   Internal hemorrhoids 01/10/2021   Dyslipidemia 12/29/2020   Seizure-like activity (HCC) 08/30/2020   History of CVA (cerebrovascular accident) 07/14/2020   Normocytic anemia 07/11/2020   General weakness 06/24/2020   Vascular dementia without behavioral disturbance (HCC) 07/15/2019   Essential hypertension 03/03/2019    ONSET DATE: Chronic  REFERRING DIAG: Z86.73 - Hx of Stroke  THERAPY DIAG:  Muscle weakness (generalized)  Visuospatial deficit  Other lack of coordination  Unsteadiness on feet  Hemiplegia and hemiparesis following cerebral infarction affecting right dominant side (HCC)  Frontal lobe and executive function deficit  Other symptoms and signs involving the nervous system  Other abnormalities of gait and mobility  Rationale for Evaluation and Treatment: Rehabilitation  SUBJECTIVE:                                                                                                                                                                                             SUBJECTIVE STATEMENT: Patient's husband reports a fall last night.  Husband reports that she was able to crawl with his guidance and was able to pull up on her own with some cues.  Patient  reports no pain and no difficulty  Pt husband stated he assists her in all daily tasks including transfers, dressing, and toileting. Pt husband stated previous aquatic therapy helped in her mobility and did receive home therapy for a time.  Pt accompanied by: significant other  PERTINENT HISTORY: See PMH chart above; noted seizure and stroke hx  PAIN:  No pain reported   PRECAUTIONS: None  RED FLAGS: None   WEIGHT BEARING RESTRICTIONS: No  FALLS: Has patient fallen in last 6 months? Yes. Number of falls 12  LIVING ENVIRONMENT: Lives with: lives with their family and lives with their spouse Lives in: House/apartment Stairs: Yes: External: 4 steps; Bilateral Has following equipment at home: Vannie - 2 wheeled and Wheelchair (manual)  PLOF: Needs assistance with ADLs, Needs assistance with homemaking, Needs assistance with gait, and Needs assistance with transfers  PATIENT GOALS: Pt husband agreeable that functional mobility improvement in order to make day to day tasks easier for him and pt to perform is the biggest goal.   OBJECTIVE:  Note: Objective measures were completed at Evaluation unless otherwise noted.  DIAGNOSTIC FINDINGS: CLINICAL HISTORY: weakness   FINDINGS:   LUNGS AND PLEURA: No focal pulmonary opacity. No pleural effusion. No pneumothorax.   HEART AND MEDIASTINUM: No acute abnormality of the cardiac and mediastinal silhouettes.   BONES AND SOFT TISSUES: No acute osseous abnormality.   IMPRESSION: 1. No acute cardiopulmonary process.   Electronically signed by: Lynwood Seip MD 11/19/2023 01:56 PM EST RP  COGNITION: Overall cognitive status: History of cognitive impairments - at baseline   SENSATION: WFL  COORDINATION: Finger to nose: unable to achieve on LUE  Increased time to perform on R side with past-pointing  EDEMA:  Visible swelling in bilateral ankles   POSTURE: rounded shoulders  LOWER EXTREMITY ROM:     Active  Right Eval  Left Eval  Hip flexion WNL WNL  Hip extension    Hip abduction WNL WNL  Hip adduction WNL WNL  Hip internal rotation    Hip external rotation    Knee flexion WNL WNL  Knee extension  WNL WNL  Ankle dorsiflexion    Ankle plantarflexion    Ankle inversion    Ankle eversion     (Blank rows = not tested)  LOWER EXTREMITY MMT:    Shoulder flex- 3+/5 bilaterally  MMT Right Eval Left Eval  Hip flexion 3+/5 3+/5  Hip extension    Hip abduction    Hip adduction    Hip internal rotation    Hip external rotation    Knee flexion 3+/5 3+/5  Knee extension 4-/5 4-/5  Ankle dorsiflexion    Ankle plantarflexion    Ankle inversion    Ankle eversion    (Blank rows = not tested)  TRANSFERS: Sit to stand: Min A  Assistive device utilized: Environmental Consultant - 2 wheeled       GAIT: Findings: Distance walked: In Clinic and Comments: ModA required for RW navigation and ambulation; pt presents with L sided neglect   FUNCTIONAL TESTS:   Plan to perform TUG                                                                                                                                TREATMENT DATE:  01/08/24 Nustep level 5 x 6 minutes TUG 68 seconds use of FWW a lot of cues and W/C follow Standing reaching with CGA Standing with walker 4 toe touches Walking HHA 100 feet with W/C follow Walking HHA direction changes Partial sit ups 2x10 Seated cone reaching left hand to right foot and right hand to left foot area Seated ball toss, then standing ball toss on solid surface and on airex   12/31/23- Eval    PATIENT EDUCATION: Education details: Discussion on current HEP pt is performing  Person educated: Patient and Spouse Education method: Explanation Education comprehension: verbalized understanding  HOME EXERCISE PROGRAM: Plan to add to current HEP given from previous PT  GOALS: Goals reviewed with patient? Yes  SHORT TERM GOALS: Target date: 02/11/24  Pt will be able to perform  all initial HEP once given with assist from spouse  Baseline: Goal status: INITIAL    LONG TERM GOALS: Target date: 03/25/23  Pt will be able to ambulate 100' with LRAD utilizing CGA in order to progress household mobility Baseline:  Goal status: INITIAL  2.  Pt will be able to perform and complete TUG <20s utilizing minA Baseline:  Goal status: INITIAL  3.  Pt will perform STS, chair to chair. And car transfers utilizing minA with ability to use bilateral UE on RW Baseline:  Goal status: INITIAL  4.  Pt will be able to navigate stairs in order to enter home with railing use utilizing CGA Baseline:  Goal status: INITIAL   ASSESSMENT:  CLINICAL IMPRESSION: This is our first visit since the evaluation.  We tested her TUG it was 68 seconds with CGA and w/c follow.  She does need a lot of cues for the transfers a lot of festination when turning  for transfers.  Needs a lot of cues for her hands and using them for transfers.  She was able to follow directions and perform all tasks, with some cues.  Did some core work as well   Patient is a 69 y.o. female who was seen today for physical therapy evaluation and treatment for residual affects from previous stroke history. She enters in a wheelchair. Pt with deficits in transfers and general mobility with frequent hx of repeated falls. Pt noted to be impulsive with movements and also has deficits in safety awareness. Pt unable to navigate with RW independently. Pt presents with L neglect and has problems with movement initiation. Pt exhibits a shuffling gait upon initiation and required modA to ambulate. Pt unable to visualize L hand to place onto walker; requires tactile and verbal cues. She will benefit from PT to improve functional mobility to allow ease with ADLs and transfers. Planned to get OT referral to work on left-right coordination, tremors, and apraxia.   OBJECTIVE IMPAIRMENTS: Abnormal gait, decreased balance, decreased cognition,  decreased coordination, decreased endurance, decreased mobility, difficulty walking, decreased strength, and decreased safety awareness.   ACTIVITY LIMITATIONS: carrying, lifting, bending, stairs, transfers, bed mobility, toileting, dressing, and self feeding  PARTICIPATION LIMITATIONS: meal prep, cleaning, laundry, medication management, driving, and community activity  PERSONAL FACTORS: Age, Past/current experiences, Time since onset of injury/illness/exacerbation, and 1-2 comorbidities:   are also affecting patient's functional outcome.   REHAB POTENTIAL: Good  CLINICAL DECISION MAKING: Evolving/moderate complexity  EVALUATION COMPLEXITY: Moderate  PLAN:  PT FREQUENCY: 2x/week  PT DURATION: 12 weeks  PLANNED INTERVENTIONS: 97110-Therapeutic exercises, 97530- Therapeutic activity, [id number]- Neuromuscular re-education, 97535- Self Care, [id number]- Manual therapy, [phone]- Gait training, and Patient/Family education  PLAN FOR NEXT SESSION: General muscle strengthening, transfer training, pre-gait activities.    OBADIAH OZELL ORN, PT 01/08/2024, 10:58 AM  "

## 2024-01-10 ENCOUNTER — Encounter: Payer: Self-pay | Admitting: Physical Therapy

## 2024-01-10 ENCOUNTER — Ambulatory Visit: Admitting: Physical Therapy

## 2024-01-10 DIAGNOSIS — R2681 Unsteadiness on feet: Secondary | ICD-10-CM

## 2024-01-10 DIAGNOSIS — R41842 Visuospatial deficit: Secondary | ICD-10-CM

## 2024-01-10 DIAGNOSIS — M6281 Muscle weakness (generalized): Secondary | ICD-10-CM | POA: Diagnosis not present

## 2024-01-10 DIAGNOSIS — R278 Other lack of coordination: Secondary | ICD-10-CM

## 2024-01-10 NOTE — Therapy (Signed)
 " OUTPATIENT PHYSICAL THERAPY NEURO TREATMENT   Patient Name: Jacqueline Bonilla MRN: [id number] DOB:September 18, 1955, 69 y.o., female Today's Date: 01/10/2024  PCP: Purcell Emil Schanz, MD REFERRING PROVIDER: Purcell Emil Schanz, MD  END OF SESSION:  PT End of Session - 01/10/24 0859     Visit Number 3    Date for Recertification  03/24/24    Authorization Type [employer]    Authorization Time Period 24 visits by 03/24/24    Authorization - Visit Number 3    Authorization - Number of Visits 24    Progress Note Due on Visit 10    PT Start Time 0844    PT Stop Time 0928    PT Time Calculation (min) 44 min    Activity Tolerance Patient tolerated treatment well    Behavior During Therapy [facility] for tasks assessed/performed          Past Medical History:  Diagnosis Date   Acute ischemic left MCA stroke (HCC) 04/01/2023   right hemiparesis   Anxiety    Cognitive impairment    Depression    Dyslipidemia    Dysmenorrhea    Endometriosis    Fibroid    Frequent falls    Gait instability    Hypertension    Implantable loop recorder present 2021   Normocytic anemia    Osteoporosis    Seizure disorder (HCC)    Statin intolerance    Tachycardia    Vascular dementia [facility])    Past Surgical History:  Procedure Laterality Date   BREAST BIOPSY     BUBBLE STUDY  12/08/2020   Procedure: BUBBLE STUDY;  Surgeon: Delford Maude BROCKS, MD;  Location: [facility] ENDOSCOPY;  Service: Cardiovascular;;   CESAREAN SECTION     hysteroscopic resection     implantable loop recorder implant  10/21/2019   Medtronic Reveal Linq model LNQ 22 ([place] [id number]) implantable loop recorder   IR CT HEAD LTD  12/01/2020   IR PERCUTANEOUS ART THROMBECTOMY/INFUSION INTRACRANIAL INC DIAG ANGIO  12/01/2020   IR US  GUIDE VASC ACCESS RIGHT  12/01/2020   ORIF HUMERUS FRACTURE Left 07/31/2021   Procedure: OPEN REDUCTION INTERNAL FIXATION (ORIF) DISTAL HUMERUS FRACTURE;  Surgeon: Kendal Franky SHAUNNA, MD;  Location: [facility] OR;   Service: Orthopedics;  Laterality: Left;   RADIOLOGY WITH ANESTHESIA N/A 12/01/2020   Procedure: IR WITH ANESTHESIA - CODE STROKE;  Surgeon: Radiologist, Medication, MD;  Location: [facility] OR;  Service: Radiology;  Laterality: N/A;   TEE WITHOUT CARDIOVERSION N/A 12/08/2020   Procedure: TRANSESOPHAGEAL ECHOCARDIOGRAM (TEE);  Surgeon: Delford Maude BROCKS, MD;  Location: [facility] ENDOSCOPY;  Service: Cardiovascular;  Laterality: N/A;   Patient Active Problem List   Diagnosis Date Noted   Pressure injury of skin 08/19/2023   Malnutrition of moderate degree 08/16/2023   Seizure (HCC) 08/15/2023   Gait disturbance, 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   Hemiparesis affecting right side as late effect of cerebrovascular accident (CVA) (HCC) 04/23/2023   Apraxia, 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   Frequent falls 02/11/2023   Statin intolerance 04/24/2022   Internal hemorrhoids 01/10/2021   Dyslipidemia 12/29/2020   Seizure-like activity (HCC) 08/30/2020   History of CVA (cerebrovascular accident) 07/14/2020   Normocytic anemia 07/11/2020   General weakness 06/24/2020   Vascular dementia without behavioral disturbance (HCC) 07/15/2019   Essential hypertension 03/03/2019    ONSET DATE: Chronic  REFERRING DIAG: Z86.73 - Hx of Stroke  THERAPY DIAG:  Muscle weakness (generalized)  Visuospatial deficit  Other lack of coordination  Unsteadiness on feet  Rationale for  Evaluation and Treatment: Rehabilitation  SUBJECTIVE:                                                                                                                                                                                             SUBJECTIVE STATEMENT:  Doing good today, no falls since last time. Felt good after last PT visit. I'm still dizzy.      EVAL: Pt husband stated he assists her in all daily tasks including transfers, dressing, and toileting. Pt husband stated previous aquatic therapy helped in her mobility and did receive home therapy for  a time.  Pt accompanied by: significant other  PERTINENT HISTORY: See PMH chart above; noted seizure and stroke hx  PAIN:  0/10 currently  PRECAUTIONS: None  RED FLAGS: None   WEIGHT BEARING RESTRICTIONS: No  FALLS: Has patient fallen in last 6 months? Yes. Number of falls 12  LIVING ENVIRONMENT: Lives with: lives with their family and lives with their spouse Lives in: House/apartment Stairs: Yes: External: 4 steps; Bilateral Has following equipment at home: Vannie - 2 wheeled and Wheelchair (manual)  PLOF: Needs assistance with ADLs, Needs assistance with homemaking, Needs assistance with gait, and Needs assistance with transfers  PATIENT GOALS: Pt husband agreeable that functional mobility improvement in order to make day to day tasks easier for him and pt to perform is the biggest goal.   OBJECTIVE:  Note: Objective measures were completed at Evaluation unless otherwise noted.  DIAGNOSTIC FINDINGS: CLINICAL HISTORY: weakness   FINDINGS:   LUNGS AND PLEURA: No focal pulmonary opacity. No pleural effusion. No pneumothorax.   HEART AND MEDIASTINUM: No acute abnormality of the cardiac and mediastinal silhouettes.   BONES AND SOFT TISSUES: No acute osseous abnormality.   IMPRESSION: 1. No acute cardiopulmonary process.   Electronically signed by: Lynwood Seip MD 11/19/2023 01:56 PM EST RP  COGNITION: Overall cognitive status: History of cognitive impairments - at baseline   SENSATION: WFL  COORDINATION: Finger to nose: unable to achieve on LUE  Increased time to perform on R side with past-pointing  EDEMA:  Visible swelling in bilateral ankles   POSTURE: rounded shoulders  LOWER EXTREMITY ROM:     Active  Right Eval Left Eval  Hip flexion WNL WNL  Hip extension    Hip abduction WNL WNL  Hip adduction WNL WNL  Hip internal rotation    Hip external rotation    Knee flexion WNL WNL  Knee extension WNL WNL  Ankle dorsiflexion    Ankle  plantarflexion    Ankle inversion    Ankle eversion     (Blank rows =  not tested)  LOWER EXTREMITY MMT:    Shoulder flex- 3+/5 bilaterally  MMT Right Eval Left Eval  Hip flexion 3+/5 3+/5  Hip extension    Hip abduction    Hip adduction    Hip internal rotation    Hip external rotation    Knee flexion 3+/5 3+/5  Knee extension 4-/5 4-/5  Ankle dorsiflexion    Ankle plantarflexion    Ankle inversion    Ankle eversion    (Blank rows = not tested)  TRANSFERS: Sit to stand: Min A  Assistive device utilized: Environmental Consultant - 2 wheeled       GAIT: Findings: Distance walked: In Clinic and Comments: ModA required for RW navigation and ambulation; pt presents with L sided neglect   FUNCTIONAL TESTS:   Plan to perform TUG                                                                                                                                TREATMENT DATE:   01/10/24  BP 129/90 HR 94  Transfers with  RW and Min-ModA and Mod multimodal cues for safety, difficult due to L neglect; also required ModA for stand-pivots with PT assist Forward toe taps to black TB target with RW alternating, x10 B cues for wt shift and step length/forward wt shift  Nustep L5x6 minutes seat 9 all four extremities    In sitting: cross midline reaching with mod cues for attention, reaching, and wt shift to L side; increased difficulty of activity in second round by adding levels (reaching down to cones and placing cones on 8 inch box on the floor on her L side)     01/08/24 Nustep level 5 x 6 minutes TUG 68 seconds use of FWW a lot of cues and W/C follow Standing reaching with CGA Standing with walker 4 toe touches Walking HHA 100 feet with W/C follow Walking HHA direction changes Partial sit ups 2x10 Seated cone reaching left hand to right foot and right hand to left foot area Seated ball toss, then standing ball toss on solid surface and on airex   12/31/23- Eval    PATIENT  EDUCATION: Education details: Discussion on current HEP pt is performing  Person educated: Patient and Spouse Education method: Explanation Education comprehension: verbalized understanding  HOME EXERCISE PROGRAM: Plan to add to current HEP given from previous PT  GOALS: Goals reviewed with patient? Yes  SHORT TERM GOALS: Target date: 02/11/24  Pt will be able to perform all initial HEP once given with assist from spouse  Baseline: Goal status: INITIAL    LONG TERM GOALS: Target date: 03/25/23  Pt will be able to ambulate 100' with LRAD utilizing CGA in order to progress household mobility Baseline:  Goal status: INITIAL  2.  Pt will be able to perform and complete TUG <20s utilizing minA Baseline:  Goal status: INITIAL  3.  Pt will perform STS, chair to chair.  And car transfers utilizing minA with ability to use bilateral UE on RW Baseline:  Goal status: INITIAL  4.  Pt will be able to navigate stairs in order to enter home with railing use utilizing CGA Baseline:  Goal status: INITIAL   ASSESSMENT:  CLINICAL IMPRESSION:  Spent a good amount of our time today working on functional neurological activities to address L neglect. She did require fairly heavy cues this morning for safety and sequencing with mobility. Remains dizzy however BP and HR were WNL- will continue to monitor. Still has a lot of difficulty with coordinating step lengths/not shuffling with L LE especially. Noted a small L facial droop at rest at EOS, but she was able to actively correct this when it was pointed out to her, also able to complete all neuromuscular functions in CVA screen with symmetry and without difficulty, no sudden vision, cognition, or sensation changes- instructed husband to keep on an eye on her and seek medical w/u if he notices it worsening through the day but have minimal concern for acute event at this point.      EVAL: Patient is a 69 y.o. female who was seen today for  physical therapy evaluation and treatment for residual affects from previous stroke history. She enters in a wheelchair. Pt with deficits in transfers and general mobility with frequent hx of repeated falls. Pt noted to be impulsive with movements and also has deficits in safety awareness. Pt unable to navigate with RW independently. Pt presents with L neglect and has problems with movement initiation. Pt exhibits a shuffling gait upon initiation and required modA to ambulate. Pt unable to visualize L hand to place onto walker; requires tactile and verbal cues. She will benefit from PT to improve functional mobility to allow ease with ADLs and transfers. Planned to get OT referral to work on left-right coordination, tremors, and apraxia.   OBJECTIVE IMPAIRMENTS: Abnormal gait, decreased balance, decreased cognition, decreased coordination, decreased endurance, decreased mobility, difficulty walking, decreased strength, and decreased safety awareness.   ACTIVITY LIMITATIONS: carrying, lifting, bending, stairs, transfers, bed mobility, toileting, dressing, and self feeding  PARTICIPATION LIMITATIONS: meal prep, cleaning, laundry, medication management, driving, and community activity  PERSONAL FACTORS: Age, Past/current experiences, Time since onset of injury/illness/exacerbation, and 1-2 comorbidities:   are also affecting patient's functional outcome.   REHAB POTENTIAL: Good  CLINICAL DECISION MAKING: Evolving/moderate complexity  EVALUATION COMPLEXITY: Moderate  PLAN:  PT FREQUENCY: 2x/week  PT DURATION: 12 weeks  PLANNED INTERVENTIONS: 97110-Therapeutic exercises, 97530- Therapeutic activity, [id number]- Neuromuscular re-education, 97535- Self Care, 02859- Manual therapy, [phone]- Gait training, and Patient/Family education  PLAN FOR NEXT SESSION: General muscle strengthening, transfer training, pre-gait activities. Keep working on addressing L neglect/improving attention   Josette Rough, PT,  DPT 01/10/2024 9:33 AM   "

## 2024-01-13 ENCOUNTER — Encounter: Payer: Self-pay | Admitting: Physical Therapy

## 2024-01-13 ENCOUNTER — Ambulatory Visit: Admitting: Physical Therapy

## 2024-01-13 DIAGNOSIS — R41842 Visuospatial deficit: Secondary | ICD-10-CM

## 2024-01-13 DIAGNOSIS — M6281 Muscle weakness (generalized): Secondary | ICD-10-CM

## 2024-01-13 DIAGNOSIS — R2681 Unsteadiness on feet: Secondary | ICD-10-CM

## 2024-01-13 DIAGNOSIS — R278 Other lack of coordination: Secondary | ICD-10-CM

## 2024-01-13 NOTE — Therapy (Signed)
 " OUTPATIENT PHYSICAL THERAPY NEURO TREATMENT   Patient Name: Jacqueline Bonilla MRN: [id number] DOB:03-06-1955, 69 y.o., female Today's Date: 01/13/2024  PCP: Purcell Emil Schanz, MD REFERRING PROVIDER: Purcell Emil Schanz, MD  END OF SESSION:  PT End of Session - 01/13/24 1004     Visit Number 4    Date for Recertification  03/24/24    Authorization Type [employer]    Authorization Time Period 24 visits by 03/24/24    Authorization - Visit Number 4    Authorization - Number of Visits 24    Progress Note Due on Visit 10    PT Start Time 0934    PT Stop Time 1013    PT Time Calculation (min) 39 min    Activity Tolerance Patient tolerated treatment well;Patient limited by fatigue    Behavior During Therapy [facility] for tasks assessed/performed;Flat affect           Past Medical History:  Diagnosis Date   Acute ischemic left MCA stroke (HCC) 04/01/2023   right hemiparesis   Anxiety    Cognitive impairment    Depression    Dyslipidemia    Dysmenorrhea    Endometriosis    Fibroid    Frequent falls    Gait instability    Hypertension    Implantable loop recorder present 2021   Normocytic anemia    Osteoporosis    Seizure disorder (HCC)    Statin intolerance    Tachycardia    Vascular dementia [facility])    Past Surgical History:  Procedure Laterality Date   BREAST BIOPSY     BUBBLE STUDY  12/08/2020   Procedure: BUBBLE STUDY;  Surgeon: Delford Maude BROCKS, MD;  Location: [facility] ENDOSCOPY;  Service: Cardiovascular;;   CESAREAN SECTION     hysteroscopic resection     implantable loop recorder implant  10/21/2019   Medtronic Reveal Linq model LNQ 22 ([place] [id number]) implantable loop recorder   IR CT HEAD LTD  12/01/2020   IR PERCUTANEOUS ART THROMBECTOMY/INFUSION INTRACRANIAL INC DIAG ANGIO  12/01/2020   IR US  GUIDE VASC ACCESS RIGHT  12/01/2020   ORIF HUMERUS FRACTURE Left 07/31/2021   Procedure: OPEN REDUCTION INTERNAL FIXATION (ORIF) DISTAL HUMERUS FRACTURE;  Surgeon:  Kendal Franky SHAUNNA, MD;  Location: [facility] OR;  Service: Orthopedics;  Laterality: Left;   RADIOLOGY WITH ANESTHESIA N/A 12/01/2020   Procedure: IR WITH ANESTHESIA - CODE STROKE;  Surgeon: Radiologist, Medication, MD;  Location: [facility] OR;  Service: Radiology;  Laterality: N/A;   TEE WITHOUT CARDIOVERSION N/A 12/08/2020   Procedure: TRANSESOPHAGEAL ECHOCARDIOGRAM (TEE);  Surgeon: Delford Maude BROCKS, MD;  Location: [facility] ENDOSCOPY;  Service: Cardiovascular;  Laterality: N/A;   Patient Active Problem List   Diagnosis Date Noted   Pressure injury of skin 08/19/2023   Malnutrition of moderate degree 08/16/2023   Seizure (HCC) 08/15/2023   Gait disturbance, 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   Hemiparesis affecting right side as late effect of cerebrovascular accident (CVA) (HCC) 04/23/2023   Apraxia, 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   Frequent falls 02/11/2023   Statin intolerance 04/24/2022   Internal hemorrhoids 01/10/2021   Dyslipidemia 12/29/2020   Seizure-like activity (HCC) 08/30/2020   History of CVA (cerebrovascular accident) 07/14/2020   Normocytic anemia 07/11/2020   General weakness 06/24/2020   Vascular dementia without behavioral disturbance (HCC) 07/15/2019   Essential hypertension 03/03/2019    ONSET DATE: Chronic  REFERRING DIAG: Z86.73 - Hx of Stroke  THERAPY DIAG:  Muscle weakness (generalized)  Other lack of coordination  Unsteadiness on feet  Visuospatial deficit  Rationale for Evaluation and Treatment: Rehabilitation  SUBJECTIVE:                                                                                                                                                                                             SUBJECTIVE STATEMENT:  Per husband: she got on her belly on the ball this weekend, it was a trick getting off.   Per pt, nothing new, felt well after PT last time.     EVAL: Pt husband stated he assists her in all daily tasks including transfers, dressing, and toileting. Pt  husband stated previous aquatic therapy helped in her mobility and did receive home therapy for a time.  Pt accompanied by: significant other  PERTINENT HISTORY: See PMH chart above; noted seizure and stroke hx  PAIN:  5/10 R wrist; aching; exercise makes it feel better and worse at the same time   PRECAUTIONS: None  RED FLAGS: None   WEIGHT BEARING RESTRICTIONS: No  FALLS: Has patient fallen in last 6 months? Yes. Number of falls 12  LIVING ENVIRONMENT: Lives with: lives with their family and lives with their spouse Lives in: House/apartment Stairs: Yes: External: 4 steps; Bilateral Has following equipment at home: Vannie - 2 wheeled and Wheelchair (manual)  PLOF: Needs assistance with ADLs, Needs assistance with homemaking, Needs assistance with gait, and Needs assistance with transfers  PATIENT GOALS: Pt husband agreeable that functional mobility improvement in order to make day to day tasks easier for him and pt to perform is the biggest goal.   OBJECTIVE:  Note: Objective measures were completed at Evaluation unless otherwise noted.  DIAGNOSTIC FINDINGS: CLINICAL HISTORY: weakness   FINDINGS:   LUNGS AND PLEURA: No focal pulmonary opacity. No pleural effusion. No pneumothorax.   HEART AND MEDIASTINUM: No acute abnormality of the cardiac and mediastinal silhouettes.   BONES AND SOFT TISSUES: No acute osseous abnormality.   IMPRESSION: 1. No acute cardiopulmonary process.   Electronically signed by: Lynwood Seip MD 11/19/2023 01:56 PM EST RP  COGNITION: Overall cognitive status: History of cognitive impairments - at baseline   SENSATION: WFL  COORDINATION: Finger to nose: unable to achieve on LUE  Increased time to perform on R side with past-pointing  EDEMA:  Visible swelling in bilateral ankles   POSTURE: rounded shoulders  LOWER EXTREMITY ROM:     Active  Right Eval Left Eval  Hip flexion WNL WNL  Hip extension    Hip abduction WNL WNL   Hip adduction WNL WNL  Hip internal rotation    Hip external rotation    Knee flexion WNL  WNL  Knee extension WNL WNL  Ankle dorsiflexion    Ankle plantarflexion    Ankle inversion    Ankle eversion     (Blank rows = not tested)  LOWER EXTREMITY MMT:    Shoulder flex- 3+/5 bilaterally  MMT Right Eval Left Eval  Hip flexion 3+/5 3+/5  Hip extension    Hip abduction    Hip adduction    Hip internal rotation    Hip external rotation    Knee flexion 3+/5 3+/5  Knee extension 4-/5 4-/5  Ankle dorsiflexion    Ankle plantarflexion    Ankle inversion    Ankle eversion    (Blank rows = not tested)  TRANSFERS: Sit to stand: Min A  Assistive device utilized: Environmental Consultant - 2 wheeled       GAIT: Findings: Distance walked: In Clinic and Comments: ModA required for RW navigation and ambulation; pt presents with L sided neglect   FUNCTIONAL TESTS:   Plan to perform TUG                                                                                                                                TREATMENT DATE:    01/13/24  STS from [facility]- MinA to boost to standing, Mod multimodal cues for sequencing and safety Cross midline reaching with 1# on R UE, cues to address L inattention- Mod multimodal cues for sequencing and extra time for processing  Seated forward reaching activity + PWR up to improve transfer ability- progression of reaches further and further forward with multimodal Mod cues  Standing- forward stepping activity to target with RW x7 L, cues for sequencing and upright posture during task. Attempted R but limited by fatigue.  Alternating seated marches + intermittent multimodal cues for midline posture x20     01/10/24  BP 129/90 HR 94  Transfers with  RW and Min-ModA and Mod multimodal cues for safety, difficult due to L neglect; also required ModA for stand-pivots with PT assist Forward toe taps to black TB target with RW alternating, x10 B cues for wt shift and step  length/forward wt shift  Nustep L5x6 minutes seat 9 all four extremities    In sitting: cross midline reaching with mod cues for attention, reaching, and wt shift to L side; increased difficulty of activity in second round by adding levels (reaching down to cones and placing cones on 8 inch box on the floor on her L side)     01/08/24 Nustep level 5 x 6 minutes TUG 68 seconds use of FWW a lot of cues and W/C follow Standing reaching with CGA Standing with walker 4 toe touches Walking HHA 100 feet with W/C follow Walking HHA direction changes Partial sit ups 2x10 Seated cone reaching left hand to right foot and right hand to left foot area Seated ball toss, then standing ball toss on solid surface and on airex   12/31/23- Eval  PATIENT EDUCATION: Education details: Discussion on current HEP pt is performing  Person educated: Patient and Spouse Education method: Explanation Education comprehension: verbalized understanding  HOME EXERCISE PROGRAM: Plan to add to current HEP given from previous PT  GOALS: Goals reviewed with patient? Yes  SHORT TERM GOALS: Target date: 02/11/24  Pt will be able to perform all initial HEP once given with assist from spouse  Baseline: Goal status: INITIAL    LONG TERM GOALS: Target date: 03/25/23  Pt will be able to ambulate 100' with LRAD utilizing CGA in order to progress household mobility Baseline:  Goal status: INITIAL  2.  Pt will be able to perform and complete TUG <20s utilizing minA Baseline:  Goal status: INITIAL  3.  Pt will perform STS, chair to chair. And car transfers utilizing minA with ability to use bilateral UE on RW Baseline:  Goal status: INITIAL  4.  Pt will be able to navigate stairs in order to enter home with railing use utilizing CGA Baseline:  Goal status: INITIAL   ASSESSMENT:  CLINICAL IMPRESSION:  Arrived today doing OK- she was more tired than usual today so we focused more on seated activities  at [facility] level with aim of improving L inattention, general coordination, and functional transfer ability and safety. Hopefully she will be feeling a little better next visit and we can return to prior intensity.     EVAL: Patient is a 69 y.o. female who was seen today for physical therapy evaluation and treatment for residual affects from previous stroke history. She enters in a wheelchair. Pt with deficits in transfers and general mobility with frequent hx of repeated falls. Pt noted to be impulsive with movements and also has deficits in safety awareness. Pt unable to navigate with RW independently. Pt presents with L neglect and has problems with movement initiation. Pt exhibits a shuffling gait upon initiation and required modA to ambulate. Pt unable to visualize L hand to place onto walker; requires tactile and verbal cues. She will benefit from PT to improve functional mobility to allow ease with ADLs and transfers. Planned to get OT referral to work on left-right coordination, tremors, and apraxia.   OBJECTIVE IMPAIRMENTS: Abnormal gait, decreased balance, decreased cognition, decreased coordination, decreased endurance, decreased mobility, difficulty walking, decreased strength, and decreased safety awareness.   ACTIVITY LIMITATIONS: carrying, lifting, bending, stairs, transfers, bed mobility, toileting, dressing, and self feeding  PARTICIPATION LIMITATIONS: meal prep, cleaning, laundry, medication management, driving, and community activity  PERSONAL FACTORS: Age, Past/current experiences, Time since onset of injury/illness/exacerbation, and 1-2 comorbidities:   are also affecting patient's functional outcome.   REHAB POTENTIAL: Good  CLINICAL DECISION MAKING: Evolving/moderate complexity  EVALUATION COMPLEXITY: Moderate  PLAN:  PT FREQUENCY: 2x/week  PT DURATION: 12 weeks  PLANNED INTERVENTIONS: 97110-Therapeutic exercises, 97530- Therapeutic activity, [id number]- Neuromuscular  re-education, 97535- Self Care, [id number]- Manual therapy, [phone]- Gait training, and Patient/Family education  PLAN FOR NEXT SESSION: General muscle strengthening, transfer training, pre-gait activities. Keep working on addressing L neglect/improving attention. Blocked practice for transfers   Josette Rough, PT, DPT 01/13/2024 10:15 AM   "

## 2024-01-16 ENCOUNTER — Encounter: Payer: Self-pay | Admitting: Physical Therapy

## 2024-01-16 ENCOUNTER — Ambulatory Visit: Admitting: Physical Therapy

## 2024-01-16 DIAGNOSIS — M6281 Muscle weakness (generalized): Secondary | ICD-10-CM

## 2024-01-16 DIAGNOSIS — R2681 Unsteadiness on feet: Secondary | ICD-10-CM

## 2024-01-16 DIAGNOSIS — R278 Other lack of coordination: Secondary | ICD-10-CM

## 2024-01-16 NOTE — Therapy (Signed)
 " OUTPATIENT PHYSICAL THERAPY NEURO TREATMENT   Patient Name: Jacqueline Bonilla MRN: [id number] DOB:04-20-1955, 69 y.o., female Today's Date: 01/16/2024  PCP: Purcell Emil Schanz, MD REFERRING PROVIDER: Purcell Emil Schanz, MD  END OF SESSION:  PT End of Session - 01/16/24 1029     Visit Number 5    Date for Recertification  03/24/24    Authorization Type [employer]    Authorization Time Period 24 visits by 03/24/24    Authorization - Visit Number 5    Authorization - Number of Visits 24    Progress Note Due on Visit 10    PT Start Time 1019    PT Stop Time 1058    PT Time Calculation (min) 39 min    Activity Tolerance Patient tolerated treatment well;Patient limited by fatigue    Behavior During Therapy [facility] for tasks assessed/performed;Flat affect            Past Medical History:  Diagnosis Date   Acute ischemic left MCA stroke (HCC) 04/01/2023   right hemiparesis   Anxiety    Cognitive impairment    Depression    Dyslipidemia    Dysmenorrhea    Endometriosis    Fibroid    Frequent falls    Gait instability    Hypertension    Implantable loop recorder present 2021   Normocytic anemia    Osteoporosis    Seizure disorder (HCC)    Statin intolerance    Tachycardia    Vascular dementia [facility])    Past Surgical History:  Procedure Laterality Date   BREAST BIOPSY     BUBBLE STUDY  12/08/2020   Procedure: BUBBLE STUDY;  Surgeon: Delford Maude BROCKS, MD;  Location: [facility] ENDOSCOPY;  Service: Cardiovascular;;   CESAREAN SECTION     hysteroscopic resection     implantable loop recorder implant  10/21/2019   Medtronic Reveal Linq model LNQ 22 ([place] [id number]) implantable loop recorder   IR CT HEAD LTD  12/01/2020   IR PERCUTANEOUS ART THROMBECTOMY/INFUSION INTRACRANIAL INC DIAG ANGIO  12/01/2020   IR US  GUIDE VASC ACCESS RIGHT  12/01/2020   ORIF HUMERUS FRACTURE Left 07/31/2021   Procedure: OPEN REDUCTION INTERNAL FIXATION (ORIF) DISTAL HUMERUS FRACTURE;  Surgeon:  Kendal Franky SHAUNNA, MD;  Location: [facility] OR;  Service: Orthopedics;  Laterality: Left;   RADIOLOGY WITH ANESTHESIA N/A 12/01/2020   Procedure: IR WITH ANESTHESIA - CODE STROKE;  Surgeon: Radiologist, Medication, MD;  Location: [facility] OR;  Service: Radiology;  Laterality: N/A;   TEE WITHOUT CARDIOVERSION N/A 12/08/2020   Procedure: TRANSESOPHAGEAL ECHOCARDIOGRAM (TEE);  Surgeon: Delford Maude BROCKS, MD;  Location: [facility] ENDOSCOPY;  Service: Cardiovascular;  Laterality: N/A;   Patient Active Problem List   Diagnosis Date Noted   Pressure injury of skin 08/19/2023   Malnutrition of moderate degree 08/16/2023   Seizure (HCC) 08/15/2023   Gait disturbance, 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   Hemiparesis affecting right side as late effect of cerebrovascular accident (CVA) (HCC) 04/23/2023   Apraxia, 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   Frequent falls 02/11/2023   Statin intolerance 04/24/2022   Internal hemorrhoids 01/10/2021   Dyslipidemia 12/29/2020   Seizure-like activity (HCC) 08/30/2020   History of CVA (cerebrovascular accident) 07/14/2020   Normocytic anemia 07/11/2020   General weakness 06/24/2020   Vascular dementia without behavioral disturbance (HCC) 07/15/2019   Essential hypertension 03/03/2019    ONSET DATE: Chronic  REFERRING DIAG: Z86.73 - Hx of Stroke  THERAPY DIAG:  Muscle weakness (generalized)  Other lack of coordination  Unsteadiness on feet  Rationale for Evaluation and Treatment: Rehabilitation  SUBJECTIVE:                                                                                                                                                                                             SUBJECTIVE STATEMENT:  Nothing new. Pain is OK today. Walked and exercised with [name] (spouse) yesterday.  Energy is better today.     EVAL: Pt husband stated he assists her in all daily tasks including transfers, dressing, and toileting. Pt husband stated previous aquatic therapy helped in her mobility  and did receive home therapy for a time.  Pt accompanied by: significant other  PERTINENT HISTORY: See PMH chart above; noted seizure and stroke hx  PAIN:  5/10 R wrist; aching; exercise makes it feel better and worse at the same time   PRECAUTIONS: None  RED FLAGS: None   WEIGHT BEARING RESTRICTIONS: No  FALLS: Has patient fallen in last 6 months? Yes. Number of falls 12  LIVING ENVIRONMENT: Lives with: lives with their family and lives with their spouse Lives in: House/apartment Stairs: Yes: External: 4 steps; Bilateral Has following equipment at home: Vannie - 2 wheeled and Wheelchair (manual)  PLOF: Needs assistance with ADLs, Needs assistance with homemaking, Needs assistance with gait, and Needs assistance with transfers  PATIENT GOALS: Pt husband agreeable that functional mobility improvement in order to make day to day tasks easier for him and pt to perform is the biggest goal.   OBJECTIVE:  Note: Objective measures were completed at Evaluation unless otherwise noted.  DIAGNOSTIC FINDINGS: CLINICAL HISTORY: weakness   FINDINGS:   LUNGS AND PLEURA: No focal pulmonary opacity. No pleural effusion. No pneumothorax.   HEART AND MEDIASTINUM: No acute abnormality of the cardiac and mediastinal silhouettes.   BONES AND SOFT TISSUES: No acute osseous abnormality.   IMPRESSION: 1. No acute cardiopulmonary process.   Electronically signed by: Lynwood Seip MD 11/19/2023 01:56 PM EST RP  COGNITION: Overall cognitive status: History of cognitive impairments - at baseline   SENSATION: WFL  COORDINATION: Finger to nose: unable to achieve on LUE  Increased time to perform on R side with past-pointing  EDEMA:  Visible swelling in bilateral ankles   POSTURE: rounded shoulders  LOWER EXTREMITY ROM:     Active  Right Eval Left Eval  Hip flexion WNL WNL  Hip extension    Hip abduction WNL WNL  Hip adduction WNL WNL  Hip internal rotation    Hip external  rotation    Knee flexion WNL WNL  Knee extension WNL WNL  Ankle dorsiflexion    Ankle plantarflexion  Ankle inversion    Ankle eversion     (Blank rows = not tested)  LOWER EXTREMITY MMT:    Shoulder flex- 3+/5 bilaterally  MMT Right Eval Left Eval  Hip flexion 3+/5 3+/5  Hip extension    Hip abduction    Hip adduction    Hip internal rotation    Hip external rotation    Knee flexion 3+/5 3+/5  Knee extension 4-/5 4-/5  Ankle dorsiflexion    Ankle plantarflexion    Ankle inversion    Ankle eversion    (Blank rows = not tested)  TRANSFERS: Sit to stand: Min A  Assistive device utilized: Environmental Consultant - 2 wheeled       GAIT: Findings: Distance walked: In Clinic and Comments: ModA required for RW navigation and ambulation; pt presents with L sided neglect   FUNCTIONAL TESTS:   Plan to perform TUG                                                                                                                                TREATMENT DATE:   01/16/24  With 1# wate bar: forward lean/reach to tap cone on 6 inch  box + OH raise x15, then progressed to cone on floor level x15  Standing in RW with PT holding random numbers- cues to turn head to see how many fingers were being held up, 50% correct on multiple attempts even with cues from therapist on L, complete failure on R/unable to ID any numbers correctly  Visual screening/testing- inconclusive, pt reports completely different impairments than she demonstrates during functional tasks, possibly due to cognition Alternating seated marches 2x20 Mod multimodal cues   Gait approximately 7ft, heavy ModA for straight line navigation increasing to MaxA and 2nd person for [facility] follow when turning     01/13/24  STS from [facility]- MinA to boost to standing, Mod multimodal cues for sequencing and safety Cross midline reaching with 1# on R UE, cues to address L inattention- Mod multimodal cues for sequencing and extra time for processing   Seated forward reaching activity + PWR up to improve transfer ability- progression of reaches further and further forward with multimodal Mod cues  Standing- forward stepping activity to target with RW x7 L, cues for sequencing and upright posture during task. Attempted R but limited by fatigue.  Alternating seated marches + intermittent multimodal cues for midline posture x20     01/10/24  BP 129/90 HR 94  Transfers with  RW and Min-ModA and Mod multimodal cues for safety, difficult due to L neglect; also required ModA for stand-pivots with PT assist Forward toe taps to black TB target with RW alternating, x10 B cues for wt shift and step length/forward wt shift  Nustep L5x6 minutes seat 9 all four extremities    In sitting: cross midline reaching with mod cues for attention, reaching, and wt shift to L side; increased difficulty of activity in second  round by adding levels (reaching down to cones and placing cones on 8 inch box on the floor on her L side)     01/08/24 Nustep level 5 x 6 minutes TUG 68 seconds use of FWW a lot of cues and W/C follow Standing reaching with CGA Standing with walker 4 toe touches Walking HHA 100 feet with W/C follow Walking HHA direction changes Partial sit ups 2x10 Seated cone reaching left hand to right foot and right hand to left foot area Seated ball toss, then standing ball toss on solid surface and on airex   12/31/23- Eval    PATIENT EDUCATION: Education details: Discussion on current HEP pt is performing  Person educated: Patient and Spouse Education method: Explanation Education comprehension: verbalized understanding  HOME EXERCISE PROGRAM: Plan to add to current HEP given from previous PT  GOALS: Goals reviewed with patient? Yes  SHORT TERM GOALS: Target date: 02/11/24  Pt will be able to perform all initial HEP once given with assist from spouse  Baseline: Goal status: INITIAL    LONG TERM GOALS: Target date:  03/25/23  Pt will be able to ambulate 100' with LRAD utilizing CGA in order to progress household mobility Baseline:  Goal status: INITIAL  2.  Pt will be able to perform and complete TUG <20s utilizing minA Baseline:  Goal status: INITIAL  3.  Pt will perform STS, chair to chair. And car transfers utilizing minA with ability to use bilateral UE on RW Baseline:  Goal status: INITIAL  4.  Pt will be able to navigate stairs in order to enter home with railing use utilizing CGA Baseline:  Goal status: INITIAL   ASSESSMENT:  CLINICAL IMPRESSION:  Continued working on blocked transfer practice and pre-transfer mobility work. She had more energy today so worked in more ambulation as well with assistance levels as above. Will continue to challenge as able/tolerated. Gait was very difficult today, very unsafe even with assistance due to inability to maintain straight line navigation  and severe loss of balance when turning. Unable to correctly identify numbers held on right and left sides today even with Max cues for visual compensations from PT. Tested visual fiends today which was inconsistent with functional performance and vision impairments during gait and other activities this visit. General prognosis fair. MAX-TOTAL CUES for all vision based tasks today with pt saying right when asked what she sees when turning head right.    EVAL: Patient is a 69 y.o. female who was seen today for physical therapy evaluation and treatment for residual affects from previous stroke history. She enters in a wheelchair. Pt with deficits in transfers and general mobility with frequent hx of repeated falls. Pt noted to be impulsive with movements and also has deficits in safety awareness. Pt unable to navigate with RW independently. Pt presents with L neglect and has problems with movement initiation. Pt exhibits a shuffling gait upon initiation and required modA to ambulate. Pt unable to visualize L hand to  place onto walker; requires tactile and verbal cues. She will benefit from PT to improve functional mobility to allow ease with ADLs and transfers. Planned to get OT referral to work on left-right coordination, tremors, and apraxia.   OBJECTIVE IMPAIRMENTS: Abnormal gait, decreased balance, decreased cognition, decreased coordination, decreased endurance, decreased mobility, difficulty walking, decreased strength, and decreased safety awareness.   ACTIVITY LIMITATIONS: carrying, lifting, bending, stairs, transfers, bed mobility, toileting, dressing, and self feeding  PARTICIPATION LIMITATIONS: meal prep, cleaning, laundry, medication management,  driving, and community activity  PERSONAL FACTORS: Age, Past/current experiences, Time since onset of injury/illness/exacerbation, and 1-2 comorbidities:   are also affecting patient's functional outcome.   REHAB POTENTIAL: Good  CLINICAL DECISION MAKING: Evolving/moderate complexity  EVALUATION COMPLEXITY: Moderate  PLAN:  PT FREQUENCY: 2x/week  PT DURATION: 12 weeks  PLANNED INTERVENTIONS: 97110-Therapeutic exercises, 97530- Therapeutic activity, [id number]- Neuromuscular re-education, 97535- Self Care, [id number]- Manual therapy, [phone]- Gait training, and Patient/Family education  PLAN FOR NEXT SESSION: General muscle strengthening, transfer training, pre-gait activities. Keep working on addressing L neglect/improving attention. Blocked practice for transfers and pre-gait tasks   Josette Rough, PT, DPT 01/16/24 11:04 AM   "

## 2024-01-20 ENCOUNTER — Encounter: Payer: Self-pay | Admitting: Physical Therapy

## 2024-01-20 ENCOUNTER — Ambulatory Visit: Admitting: Physical Therapy

## 2024-01-20 DIAGNOSIS — R2681 Unsteadiness on feet: Secondary | ICD-10-CM

## 2024-01-20 DIAGNOSIS — M6281 Muscle weakness (generalized): Secondary | ICD-10-CM | POA: Diagnosis not present

## 2024-01-20 DIAGNOSIS — R278 Other lack of coordination: Secondary | ICD-10-CM

## 2024-01-20 NOTE — Therapy (Signed)
 " OUTPATIENT PHYSICAL THERAPY NEURO TREATMENT   Patient Name: Jacqueline Bonilla MRN: [id number] DOB:19-Sep-1955, 69 y.o., female Today's Date: 01/20/2024  PCP: Purcell Emil Schanz, MD REFERRING PROVIDER: Purcell Emil Schanz, MD  END OF SESSION:  PT End of Session - 01/20/24 0853     Visit Number 6    Date for Recertification  03/24/24    Authorization Type [employer]    Authorization Time Period 24 visits by 03/24/24    Authorization - Visit Number 6    Authorization - Number of Visits 24    Progress Note Due on Visit 10    PT Start Time 0850    PT Stop Time 0928    PT Time Calculation (min) 38 min    Activity Tolerance Patient tolerated treatment well;Patient limited by fatigue    Behavior During Therapy [facility] for tasks assessed/performed;Flat affect             Past Medical History:  Diagnosis Date   Acute ischemic left MCA stroke (HCC) 04/01/2023   right hemiparesis   Anxiety    Cognitive impairment    Depression    Dyslipidemia    Dysmenorrhea    Endometriosis    Fibroid    Frequent falls    Gait instability    Hypertension    Implantable loop recorder present 2021   Normocytic anemia    Osteoporosis    Seizure disorder (HCC)    Statin intolerance    Tachycardia    Vascular dementia [facility])    Past Surgical History:  Procedure Laterality Date   BREAST BIOPSY     BUBBLE STUDY  12/08/2020   Procedure: BUBBLE STUDY;  Surgeon: Delford Maude BROCKS, MD;  Location: [facility] ENDOSCOPY;  Service: Cardiovascular;;   CESAREAN SECTION     hysteroscopic resection     implantable loop recorder implant  10/21/2019   Medtronic Reveal Linq model LNQ 22 ([place] [id number]) implantable loop recorder   IR CT HEAD LTD  12/01/2020   IR PERCUTANEOUS ART THROMBECTOMY/INFUSION INTRACRANIAL INC DIAG ANGIO  12/01/2020   IR US  GUIDE VASC ACCESS RIGHT  12/01/2020   ORIF HUMERUS FRACTURE Left 07/31/2021   Procedure: OPEN REDUCTION INTERNAL FIXATION (ORIF) DISTAL HUMERUS FRACTURE;  Surgeon:  Kendal Franky SHAUNNA, MD;  Location: [facility] OR;  Service: Orthopedics;  Laterality: Left;   RADIOLOGY WITH ANESTHESIA N/A 12/01/2020   Procedure: IR WITH ANESTHESIA - CODE STROKE;  Surgeon: Radiologist, Medication, MD;  Location: [facility] OR;  Service: Radiology;  Laterality: N/A;   TEE WITHOUT CARDIOVERSION N/A 12/08/2020   Procedure: TRANSESOPHAGEAL ECHOCARDIOGRAM (TEE);  Surgeon: Delford Maude BROCKS, MD;  Location: [facility] ENDOSCOPY;  Service: Cardiovascular;  Laterality: N/A;   Patient Active Problem List   Diagnosis Date Noted   Pressure injury of skin 08/19/2023   Malnutrition of moderate degree 08/16/2023   Seizure (HCC) 08/15/2023   Gait disturbance, 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   Hemiparesis affecting right side as late effect of cerebrovascular accident (CVA) (HCC) 04/23/2023   Apraxia, 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   Frequent falls 02/11/2023   Statin intolerance 04/24/2022   Internal hemorrhoids 01/10/2021   Dyslipidemia 12/29/2020   Seizure-like activity (HCC) 08/30/2020   History of CVA (cerebrovascular accident) 07/14/2020   Normocytic anemia 07/11/2020   General weakness 06/24/2020   Vascular dementia without behavioral disturbance (HCC) 07/15/2019   Essential hypertension 03/03/2019    ONSET DATE: Chronic  REFERRING DIAG: Z86.73 - Hx of Stroke  THERAPY DIAG:  Muscle weakness (generalized)  Other lack of coordination  Unsteadiness on  feet  Rationale for Evaluation and Treatment: Rehabilitation  SUBJECTIVE:                                                                                                                                                                                             SUBJECTIVE STATEMENT:  Nothing new. Had a good weekend. Wrist is hurting but it always does that when I get up in the morning. Don't have a lot of energy today.     EVAL: Pt husband stated he assists her in all daily tasks including transfers, dressing, and toileting. Pt husband stated previous aquatic  therapy helped in her mobility and did receive home therapy for a time.  Pt accompanied by: significant other  PERTINENT HISTORY: See PMH chart above; noted seizure and stroke hx  PAIN:  Little bit of pain in R wrist but that's normal/constant; nothing makes it worse and better   PRECAUTIONS: None  RED FLAGS: None   WEIGHT BEARING RESTRICTIONS: No  FALLS: Has patient fallen in last 6 months? Yes. Number of falls 12  LIVING ENVIRONMENT: Lives with: lives with their family and lives with their spouse Lives in: House/apartment Stairs: Yes: External: 4 steps; Bilateral Has following equipment at home: Vannie - 2 wheeled and Wheelchair (manual)  PLOF: Needs assistance with ADLs, Needs assistance with homemaking, Needs assistance with gait, and Needs assistance with transfers  PATIENT GOALS: Pt husband agreeable that functional mobility improvement in order to make day to day tasks easier for him and pt to perform is the biggest goal.   OBJECTIVE:  Note: Objective measures were completed at Evaluation unless otherwise noted.  DIAGNOSTIC FINDINGS: CLINICAL HISTORY: weakness   FINDINGS:   LUNGS AND PLEURA: No focal pulmonary opacity. No pleural effusion. No pneumothorax.   HEART AND MEDIASTINUM: No acute abnormality of the cardiac and mediastinal silhouettes.   BONES AND SOFT TISSUES: No acute osseous abnormality.   IMPRESSION: 1. No acute cardiopulmonary process.   Electronically signed by: Lynwood Seip MD 11/19/2023 01:56 PM EST RP  COGNITION: Overall cognitive status: History of cognitive impairments - at baseline   SENSATION: WFL  COORDINATION: Finger to nose: unable to achieve on LUE  Increased time to perform on R side with past-pointing  EDEMA:  Visible swelling in bilateral ankles   POSTURE: rounded shoulders  LOWER EXTREMITY ROM:     Active  Right Eval Left Eval  Hip flexion WNL WNL  Hip extension    Hip abduction WNL WNL  Hip adduction WNL  WNL  Hip internal rotation    Hip external rotation    Knee flexion WNL WNL  Knee extension WNL WNL  Ankle dorsiflexion    Ankle plantarflexion    Ankle inversion    Ankle eversion     (Blank rows = not tested)  LOWER EXTREMITY MMT:    Shoulder flex- 3+/5 bilaterally  MMT Right Eval Left Eval R 01/20/24 L 01/20/24  Hip flexion 3+/5 3+/5 3 3   Hip extension      Hip abduction      Hip adduction      Hip internal rotation      Hip external rotation      Knee flexion 3+/5 3+/5 3 3   Knee extension 4-/5 4-/5 4- 4-  Ankle dorsiflexion      Ankle plantarflexion      Ankle inversion      Ankle eversion      (Blank rows = not tested)  TRANSFERS: Sit to stand: Min A  Assistive device utilized: Environmental Consultant - 2 wheeled       GAIT: Findings: Distance walked: In Clinic and Comments: ModA required for RW navigation and ambulation; pt presents with L sided neglect   FUNCTIONAL TESTS:   Plan to perform TUG                                                                                                                                TREATMENT DATE:   01/20/24  MMT, goals Seated marches 2x20 with 2.5# each LE, visual and verbal cues for midline sitting  Seated shoulder flexion with 2# WaTE bar 2x12 Standing with RW, reaches to midline and cross midline to tap target x5 B with rest PRN  STS working on slow eccentric lower x5, mod multimodal cues  Hitting yellow ball with 2# WaTe with BUEs x6     01/16/24  With 1# wate bar: forward lean/reach to tap cone on 6 inch  box + OH raise x15, then progressed to cone on floor level x15  Standing in RW with PT holding random numbers- cues to turn head to see how many fingers were being held up, 50% correct on multiple attempts even with cues from therapist on L, complete failure on R/unable to ID any numbers correctly  Visual screening/testing- inconclusive, pt reports completely different impairments than she demonstrates during functional  tasks, possibly due to cognition Alternating seated marches 2x20 Mod multimodal cues   Gait approximately 32ft, heavy ModA for straight line navigation increasing to MaxA and 2nd person for [facility] follow when turning     01/13/24  STS from [facility]- MinA to boost to standing, Mod multimodal cues for sequencing and safety Cross midline reaching with 1# on R UE, cues to address L inattention- Mod multimodal cues for sequencing and extra time for processing  Seated forward reaching activity + PWR up to improve transfer ability- progression of reaches further and further forward with multimodal Mod cues  Standing- forward stepping activity to target with RW x7 L, cues for sequencing and upright posture  during task. Attempted R but limited by fatigue.  Alternating seated marches + intermittent multimodal cues for midline posture x20     01/10/24  BP 129/90 HR 94  Transfers with  RW and Min-ModA and Mod multimodal cues for safety, difficult due to L neglect; also required ModA for stand-pivots with PT assist Forward toe taps to black TB target with RW alternating, x10 B cues for wt shift and step length/forward wt shift  Nustep L5x6 minutes seat 9 all four extremities    In sitting: cross midline reaching with mod cues for attention, reaching, and wt shift to L side; increased difficulty of activity in second round by adding levels (reaching down to cones and placing cones on 8 inch box on the floor on her L side)     01/08/24 Nustep level 5 x 6 minutes TUG 68 seconds use of FWW a lot of cues and W/C follow Standing reaching with CGA Standing with walker 4 toe touches Walking HHA 100 feet with W/C follow Walking HHA direction changes Partial sit ups 2x10 Seated cone reaching left hand to right foot and right hand to left foot area Seated ball toss, then standing ball toss on solid surface and on airex   12/31/23- Eval    PATIENT EDUCATION: Education details: Discussion on current HEP pt is  performing  Person educated: Patient and Spouse Education method: Explanation Education comprehension: verbalized understanding  HOME EXERCISE PROGRAM: Plan to add to current HEP given from previous PT  GOALS: Goals reviewed with patient? Yes  SHORT TERM GOALS: Target date: 02/11/24  Pt will be able to perform all initial HEP once given with assist from spouse  Baseline: Goal status: ONGOING 1/19    LONG TERM GOALS: Target date: 03/25/23  Pt will be able to ambulate 100' with LRAD utilizing CGA in order to progress household mobility Baseline:  Goal status: ONGOING 1/19  2.  Pt will be able to perform and complete TUG <20s utilizing minA Baseline:  Goal status: INITIAL  3.  Pt will perform STS, chair to chair. And car transfers utilizing minA with ability to use bilateral UE on RW Baseline:  Goal status: ONGOING 1/19  4.  Pt will be able to navigate stairs in order to enter home with railing use utilizing CGA Baseline:  Goal status: INITIAL   ASSESSMENT:  CLINICAL IMPRESSION:  Arrived today without her WC- spouse reported it was in the truck; educated [facility] mobility is really safest given severity of gait impairments and high fall risk. She reported she was fairly tired today, we worked on a lot of seated midline work from clinic WC. Checked MMT, see table as above. Used mirror today for visual feedback to reduce trunk lean- had a hard time with this, tends to lean significantly to her R when cues are not provided. Very easily fatigued. Family is pushing her hard at home in terms of walking but I question if this may be reinforcing compensation patterns. Will continue efforts.    EVAL: Patient is a 69 y.o. female who was seen today for physical therapy evaluation and treatment for residual affects from previous stroke history. She enters in a wheelchair. Pt with deficits in transfers and general mobility with frequent hx of repeated falls. Pt noted to be impulsive with  movements and also has deficits in safety awareness. Pt unable to navigate with RW independently. Pt presents with L neglect and has problems with movement initiation. Pt exhibits a shuffling gait upon initiation  and required modA to ambulate. Pt unable to visualize L hand to place onto walker; requires tactile and verbal cues. She will benefit from PT to improve functional mobility to allow ease with ADLs and transfers. Planned to get OT referral to work on left-right coordination, tremors, and apraxia.   OBJECTIVE IMPAIRMENTS: Abnormal gait, decreased balance, decreased cognition, decreased coordination, decreased endurance, decreased mobility, difficulty walking, decreased strength, and decreased safety awareness.   ACTIVITY LIMITATIONS: carrying, lifting, bending, stairs, transfers, bed mobility, toileting, dressing, and self feeding  PARTICIPATION LIMITATIONS: meal prep, cleaning, laundry, medication management, driving, and community activity  PERSONAL FACTORS: Age, Past/current experiences, Time since onset of injury/illness/exacerbation, and 1-2 comorbidities:   are also affecting patient's functional outcome.   REHAB POTENTIAL: Good  CLINICAL DECISION MAKING: Evolving/moderate complexity  EVALUATION COMPLEXITY: Moderate  PLAN:  PT FREQUENCY: 2x/week  PT DURATION: 12 weeks  PLANNED INTERVENTIONS: 97110-Therapeutic exercises, 97530- Therapeutic activity, [id number]- Neuromuscular re-education, 97535- Self Care, [id number]- Manual therapy, [phone]- Gait training, and Patient/Family education  PLAN FOR NEXT SESSION: General muscle strengthening, transfer training, pre-gait activities. Keep working on addressing L neglect/improving attention. Blocked practice for transfers and pre-gait tasks. Reinforce safety at home/try to reduce fall risk outside of clinic. Keep using mirror to find midline, this was helpful    Josette Rough, PT, DPT 01/20/24 9:30 AM   "

## 2024-01-23 ENCOUNTER — Encounter: Payer: Self-pay | Admitting: Physical Therapy

## 2024-01-23 ENCOUNTER — Encounter

## 2024-01-23 ENCOUNTER — Ambulatory Visit: Admitting: Physical Therapy

## 2024-01-23 DIAGNOSIS — M6281 Muscle weakness (generalized): Secondary | ICD-10-CM

## 2024-01-23 DIAGNOSIS — R2681 Unsteadiness on feet: Secondary | ICD-10-CM

## 2024-01-23 DIAGNOSIS — R278 Other lack of coordination: Secondary | ICD-10-CM

## 2024-01-23 NOTE — Therapy (Signed)
 " OUTPATIENT PHYSICAL THERAPY NEURO TREATMENT   Patient Name: Jacqueline Bonilla MRN: [id number] DOB:1955/03/05, 69 y.o., female Today's Date: 01/23/2024  PCP: Purcell Emil Schanz, MD REFERRING PROVIDER: Purcell Emil Schanz, MD  END OF SESSION:  PT End of Session - 01/23/24 1022     Visit Number 7    Authorization Type [employer]    Authorization Time Period 24 visits by 03/24/24    Authorization - Visit Number 7    Authorization - Number of Visits 24    Progress Note Due on Visit 10    PT Start Time 0933    PT Stop Time 1012    PT Time Calculation (min) 39 min    Activity Tolerance Patient tolerated treatment well;Patient limited by fatigue    Behavior During Therapy [facility] for tasks assessed/performed;Flat affect              Past Medical History:  Diagnosis Date   Acute ischemic left MCA stroke (HCC) 04/01/2023   right hemiparesis   Anxiety    Cognitive impairment    Depression    Dyslipidemia    Dysmenorrhea    Endometriosis    Fibroid    Frequent falls    Gait instability    Hypertension    Implantable loop recorder present 2021   Normocytic anemia    Osteoporosis    Seizure disorder (HCC)    Statin intolerance    Tachycardia    Vascular dementia [facility])    Past Surgical History:  Procedure Laterality Date   BREAST BIOPSY     BUBBLE STUDY  12/08/2020   Procedure: BUBBLE STUDY;  Surgeon: Delford Maude BROCKS, MD;  Location: [facility] ENDOSCOPY;  Service: Cardiovascular;;   CESAREAN SECTION     hysteroscopic resection     implantable loop recorder implant  10/21/2019   Medtronic Reveal Linq model LNQ 22 ([place] [id number]) implantable loop recorder   IR CT HEAD LTD  12/01/2020   IR PERCUTANEOUS ART THROMBECTOMY/INFUSION INTRACRANIAL INC DIAG ANGIO  12/01/2020   IR US  GUIDE VASC ACCESS RIGHT  12/01/2020   ORIF HUMERUS FRACTURE Left 07/31/2021   Procedure: OPEN REDUCTION INTERNAL FIXATION (ORIF) DISTAL HUMERUS FRACTURE;  Surgeon: Kendal Franky SHAUNNA, MD;  Location: [facility] OR;   Service: Orthopedics;  Laterality: Left;   RADIOLOGY WITH ANESTHESIA N/A 12/01/2020   Procedure: IR WITH ANESTHESIA - CODE STROKE;  Surgeon: Radiologist, Medication, MD;  Location: [facility] OR;  Service: Radiology;  Laterality: N/A;   TEE WITHOUT CARDIOVERSION N/A 12/08/2020   Procedure: TRANSESOPHAGEAL ECHOCARDIOGRAM (TEE);  Surgeon: Delford Maude BROCKS, MD;  Location: [facility] ENDOSCOPY;  Service: Cardiovascular;  Laterality: N/A;   Patient Active Problem List   Diagnosis Date Noted   Pressure injury of skin 08/19/2023   Malnutrition of moderate degree 08/16/2023   Seizure (HCC) 08/15/2023   Gait disturbance, 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   Hemiparesis affecting right side as late effect of cerebrovascular accident (CVA) (HCC) 04/23/2023   Apraxia, 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   Frequent falls 02/11/2023   Statin intolerance 04/24/2022   Internal hemorrhoids 01/10/2021   Dyslipidemia 12/29/2020   Seizure-like activity (HCC) 08/30/2020   History of CVA (cerebrovascular accident) 07/14/2020   Normocytic anemia 07/11/2020   General weakness 06/24/2020   Vascular dementia without behavioral disturbance (HCC) 07/15/2019   Essential hypertension 03/03/2019    ONSET DATE: Chronic  REFERRING DIAG: Z86.73 - Hx of Stroke  THERAPY DIAG:  Muscle weakness (generalized)  Other lack of coordination  Unsteadiness on feet  Rationale for Evaluation and Treatment:  Rehabilitation  SUBJECTIVE:                                                                                                                                                                                             SUBJECTIVE STATEMENT:  Felt good after last time.   Per spouse- was able to put a tennis ball on a cone with arm extension the other day, was on the Nustep 10-11 minutes the other day. Had 2 falls the other day, she got up from her back from one and the other from hands and knees. When she starts to fall, I make sure I lower her with gait belt,  she always wants to sit early.     EVAL: Pt husband stated he assists her in all daily tasks including transfers, dressing, and toileting. Pt husband stated previous aquatic therapy helped in her mobility and did receive home therapy for a time.  Pt accompanied by: significant other  PERTINENT HISTORY: See PMH chart above; noted seizure and stroke hx  PAIN:  Little bit of pain in R wrist but that's normal/constant; nothing makes it worse and better no number given on NPRS scale   PRECAUTIONS: None  RED FLAGS: None   WEIGHT BEARING RESTRICTIONS: No  FALLS: Has patient fallen in last 6 months? Yes. Number of falls 12  LIVING ENVIRONMENT: Lives with: lives with their family and lives with their spouse Lives in: House/apartment Stairs: Yes: External: 4 steps; Bilateral Has following equipment at home: Vannie - 2 wheeled and Wheelchair (manual)  PLOF: Needs assistance with ADLs, Needs assistance with homemaking, Needs assistance with gait, and Needs assistance with transfers  PATIENT GOALS: Pt husband agreeable that functional mobility improvement in order to make day to day tasks easier for him and pt to perform is the biggest goal.   OBJECTIVE:  Note: Objective measures were completed at Evaluation unless otherwise noted.  DIAGNOSTIC FINDINGS: CLINICAL HISTORY: weakness   FINDINGS:   LUNGS AND PLEURA: No focal pulmonary opacity. No pleural effusion. No pneumothorax.   HEART AND MEDIASTINUM: No acute abnormality of the cardiac and mediastinal silhouettes.   BONES AND SOFT TISSUES: No acute osseous abnormality.   IMPRESSION: 1. No acute cardiopulmonary process.   Electronically signed by: Lynwood Seip MD 11/19/2023 01:56 PM EST RP  COGNITION: Overall cognitive status: History of cognitive impairments - at baseline   SENSATION: WFL  COORDINATION: Finger to nose: unable to achieve on LUE  Increased time to perform on R side with past-pointing  EDEMA:  Visible  swelling in bilateral ankles   POSTURE: rounded shoulders  LOWER EXTREMITY ROM:  Active  Right Eval Left Eval  Hip flexion WNL WNL  Hip extension    Hip abduction WNL WNL  Hip adduction WNL WNL  Hip internal rotation    Hip external rotation    Knee flexion WNL WNL  Knee extension WNL WNL  Ankle dorsiflexion    Ankle plantarflexion    Ankle inversion    Ankle eversion     (Blank rows = not tested)  LOWER EXTREMITY MMT:    Shoulder flex- 3+/5 bilaterally  MMT Right Eval Left Eval R 01/20/24 L 01/20/24  Hip flexion 3+/5 3+/5 3 3   Hip extension      Hip abduction      Hip adduction      Hip internal rotation      Hip external rotation      Knee flexion 3+/5 3+/5 3 3   Knee extension 4-/5 4-/5 4- 4-  Ankle dorsiflexion      Ankle plantarflexion      Ankle inversion      Ankle eversion      (Blank rows = not tested)  TRANSFERS: Sit to stand: Min A  Assistive device utilized: Environmental Consultant - 2 wheeled       GAIT: Findings: Distance walked: In Clinic and Comments: ModA required for RW navigation and ambulation; pt presents with L sided neglect   FUNCTIONAL TESTS:   Plan to perform TUG                                                                                                                                TREATMENT DATE:   01/23/24   Seated cone taps with 2# Wate with mirror for visual feedback, did much better finding midline but still needed cues  Attempted standing midline tasks, however pt was distracted by other therapist/pt who had entered space- changed spaces and she was able to perform much better Cross midline reaches to L with moving target from PT Worked on finding midline in standing with Mod multimodal cues from PT with RW Discussed PT care with husband, also discussed ways to work on midline standing at home using visual feedback via mirror   Gait approximately 20ft + 38ft, RW, ModA/Mod multimodal cues       01/20/24  MMT, goals Seated  marches 2x20 with 2.5# each LE, visual and verbal cues for midline sitting  Seated shoulder flexion with 2# WaTE bar 2x12 Standing with RW, reaches to midline and cross midline to tap target x5 B with rest PRN  STS working on slow eccentric lower x5, mod multimodal cues  Hitting yellow ball with 2# WaTe with BUEs x6     01/16/24  With 1# wate bar: forward lean/reach to tap cone on 6 inch  box + OH raise x15, then progressed to cone on floor level x15  Standing in RW with PT holding random numbers- cues to turn head to see how many fingers were being held  up, 50% correct on multiple attempts even with cues from therapist on L, complete failure on R/unable to ID any numbers correctly  Visual screening/testing- inconclusive, pt reports completely different impairments than she demonstrates during functional tasks, possibly due to cognition Alternating seated marches 2x20 Mod multimodal cues   Gait approximately 37ft, heavy ModA for straight line navigation increasing to MaxA and 2nd person for [facility] follow when turning     01/13/24  STS from [facility]- MinA to boost to standing, Mod multimodal cues for sequencing and safety Cross midline reaching with 1# on R UE, cues to address L inattention- Mod multimodal cues for sequencing and extra time for processing  Seated forward reaching activity + PWR up to improve transfer ability- progression of reaches further and further forward with multimodal Mod cues  Standing- forward stepping activity to target with RW x7 L, cues for sequencing and upright posture during task. Attempted R but limited by fatigue.  Alternating seated marches + intermittent multimodal cues for midline posture x20     01/10/24  BP 129/90 HR 94  Transfers with  RW and Min-ModA and Mod multimodal cues for safety, difficult due to L neglect; also required ModA for stand-pivots with PT assist Forward toe taps to black TB target with RW alternating, x10 B cues for wt shift and step  length/forward wt shift  Nustep L5x6 minutes seat 9 all four extremities    In sitting: cross midline reaching with mod cues for attention, reaching, and wt shift to L side; increased difficulty of activity in second round by adding levels (reaching down to cones and placing cones on 8 inch box on the floor on her L side)     01/08/24 Nustep level 5 x 6 minutes TUG 68 seconds use of FWW a lot of cues and W/C follow Standing reaching with CGA Standing with walker 4 toe touches Walking HHA 100 feet with W/C follow Walking HHA direction changes Partial sit ups 2x10 Seated cone reaching left hand to right foot and right hand to left foot area Seated ball toss, then standing ball toss on solid surface and on airex   12/31/23- Eval    PATIENT EDUCATION: Education details: Discussion on current HEP pt is performing  Person educated: Patient and Spouse Education method: Explanation Education comprehension: verbalized understanding  HOME EXERCISE PROGRAM: Plan to add to current HEP given from previous PT  GOALS: Goals reviewed with patient? Yes  SHORT TERM GOALS: Target date: 02/11/24  Pt will be able to perform all initial HEP once given with assist from spouse  Baseline: Goal status: ONGOING 1/19    LONG TERM GOALS: Target date: 03/25/23  Pt will be able to ambulate 100' with LRAD utilizing CGA in order to progress household mobility Baseline:  Goal status: ONGOING 1/19  2.  Pt will be able to perform and complete TUG <20s utilizing minA Baseline:  Goal status: INITIAL  3.  Pt will perform STS, chair to chair. And car transfers utilizing minA with ability to use bilateral UE on RW Baseline:  Goal status: ONGOING 1/19  4.  Pt will be able to navigate stairs in order to enter home with railing use utilizing CGA Baseline:  Goal status: INITIAL   ASSESSMENT:  CLINICAL IMPRESSION:  Arrives today doing well, spouse was present for tx session and assisted with WC push  during gait. We continued working more on finding and adjusting to midline today in sitting and standing positions with Mod multimodal cues. Continues  to fatigue quickly. We worked more on gait today since spouse was present, continues to require heavy physical assist and cues as well as close chair follow due to fatigue levels and poor coordination, also poor general safety awareness. She is starting with OT next week, I think this will be helpful. Spouse has been working very hard with her at home, they actually have quite a bit of equipment- discussed another technique to help her find midline in standing (use of mirror hung on wall at face level with sharpie line drawn center so she can line up with this when standing against the wall). Will continue to progress as able/tolerated.    EVAL: Patient is a 69 y.o. female who was seen today for physical therapy evaluation and treatment for residual affects from previous stroke history. She enters in a wheelchair. Pt with deficits in transfers and general mobility with frequent hx of repeated falls. Pt noted to be impulsive with movements and also has deficits in safety awareness. Pt unable to navigate with RW independently. Pt presents with L neglect and has problems with movement initiation. Pt exhibits a shuffling gait upon initiation and required modA to ambulate. Pt unable to visualize L hand to place onto walker; requires tactile and verbal cues. She will benefit from PT to improve functional mobility to allow ease with ADLs and transfers. Planned to get OT referral to work on left-right coordination, tremors, and apraxia.   OBJECTIVE IMPAIRMENTS: Abnormal gait, decreased balance, decreased cognition, decreased coordination, decreased endurance, decreased mobility, difficulty walking, decreased strength, and decreased safety awareness.   ACTIVITY LIMITATIONS: carrying, lifting, bending, stairs, transfers, bed mobility, toileting, dressing, and self  feeding  PARTICIPATION LIMITATIONS: meal prep, cleaning, laundry, medication management, driving, and community activity  PERSONAL FACTORS: Age, Past/current experiences, Time since onset of injury/illness/exacerbation, and 1-2 comorbidities:   are also affecting patient's functional outcome.   REHAB POTENTIAL: Good  CLINICAL DECISION MAKING: Evolving/moderate complexity  EVALUATION COMPLEXITY: Moderate  PLAN:  PT FREQUENCY: 2x/week  PT DURATION: 12 weeks  PLANNED INTERVENTIONS: 97110-Therapeutic exercises, 97530- Therapeutic activity, [id number]- Neuromuscular re-education, 97535- Self Care, [id number]- Manual therapy, [phone]- Gait training, and Patient/Family education  PLAN FOR NEXT SESSION: General muscle strengthening, transfer training, pre-gait activities. Keep working on addressing L neglect/improving attention. Blocked practice for transfers and pre-gait tasks. Reinforce safety at home/try to reduce fall risk outside of clinic. Keep using mirror to find midline  Josette Rough, PT, DPT 01/23/24 10:32 AM   "

## 2024-01-25 ENCOUNTER — Ambulatory Visit: Attending: Emergency Medicine

## 2024-01-27 ENCOUNTER — Ambulatory Visit: Admitting: Physical Therapy

## 2024-01-27 ENCOUNTER — Encounter

## 2024-01-28 NOTE — Therapy (Incomplete)
 " OUTPATIENT OCCUPATIONAL THERAPY NEURO EVALUATION  Patient Name: Jacqueline Bonilla MRN: [id number] DOB:1955-02-28, 69 y.o., female Today's Date: 01/28/2024  PCP: Dr. Purcell REFERRING PROVIDER: Dr. Purcell  END OF SESSION:   Past Medical History:  Diagnosis Date   Acute ischemic left MCA stroke (HCC) 04/01/2023   right hemiparesis   Anxiety    Cognitive impairment    Depression    Dyslipidemia    Dysmenorrhea    Endometriosis    Fibroid    Frequent falls    Gait instability    Hypertension    Implantable loop recorder present 2021   Normocytic anemia    Osteoporosis    Seizure disorder (HCC)    Statin intolerance    Tachycardia    Vascular dementia [facility])    Past Surgical History:  Procedure Laterality Date   BREAST BIOPSY     BUBBLE STUDY  12/08/2020   Procedure: BUBBLE STUDY;  Surgeon: Delford Maude BROCKS, MD;  Location: [facility] ENDOSCOPY;  Service: Cardiovascular;;   CESAREAN SECTION     hysteroscopic resection     implantable loop recorder implant  10/21/2019   Medtronic Reveal Linq model LNQ 22 ([place] [id number]) implantable loop recorder   IR CT HEAD LTD  12/01/2020   IR PERCUTANEOUS ART THROMBECTOMY/INFUSION INTRACRANIAL INC DIAG ANGIO  12/01/2020   IR US  GUIDE VASC ACCESS RIGHT  12/01/2020   ORIF HUMERUS FRACTURE Left 07/31/2021   Procedure: OPEN REDUCTION INTERNAL FIXATION (ORIF) DISTAL HUMERUS FRACTURE;  Surgeon: Kendal Franky SHAUNNA, MD;  Location: [facility] OR;  Service: Orthopedics;  Laterality: Left;   RADIOLOGY WITH ANESTHESIA N/A 12/01/2020   Procedure: IR WITH ANESTHESIA - CODE STROKE;  Surgeon: Radiologist, Medication, MD;  Location: [facility] OR;  Service: Radiology;  Laterality: N/A;   TEE WITHOUT CARDIOVERSION N/A 12/08/2020   Procedure: TRANSESOPHAGEAL ECHOCARDIOGRAM (TEE);  Surgeon: Delford Maude BROCKS, MD;  Location: [facility] ENDOSCOPY;  Service: Cardiovascular;  Laterality: N/A;   Patient Active Problem List   Diagnosis Date Noted   Pressure injury of skin 08/19/2023   Malnutrition  of moderate degree 08/16/2023   Seizure (HCC) 08/15/2023   Gait disturbance, 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   Hemiparesis affecting right side as late effect of cerebrovascular accident (CVA) (HCC) 04/23/2023   Apraxia, 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   Frequent falls 02/11/2023   Statin intolerance 04/24/2022   Internal hemorrhoids 01/10/2021   Dyslipidemia 12/29/2020   Seizure-like activity (HCC) 08/30/2020   History of CVA (cerebrovascular accident) 07/14/2020   Normocytic anemia 07/11/2020   General weakness 06/24/2020   Vascular dementia without behavioral disturbance (HCC) 07/15/2019   Essential hypertension 03/03/2019    ONSET DATE: 09/05/23- recent hospitalization  REFERRING DIAG:  Z86.73 (ICD-10-CM) - History of stroke  I69.351 (ICD-10-CM) - Hemiparesis affecting right side as late effect of cerebrovascular accident (CVA) (HCC)    THERAPY DIAG:  No diagnosis found.  Rationale for Evaluation and Treatment: Rehabilitation  SUBJECTIVE:   SUBJECTIVE STATEMENT: *** Pt accompanied by: {accompnied:27141}  PERTINENT HISTORY: Jacqueline Bonilla is a 69 y.o. female who was hospitalized 09/05/23 with right-sided facial droop and slurred speech. When she arrived in the ED, she began seizing. CT head was negative for any acute processes.  Multiple remote infarcts as well as chronic small vessel disease with parenchymal volume loss was noted. PMH significant of 2 left MCA territory infarcts participating in the [place] trial, loop recorder placement, vascular dementia, sleep apnea, HLD, thrombocytopenia, osteoporosis, HTN, depression, anxiety and anemia  PRECAUTIONS: {Therapy precautions:24002}  WEIGHT BEARING RESTRICTIONS: {Yes ***/No:24003}  PAIN:  Are you having pain? {OPRCPAIN:27236}  FALLS: Has patient fallen in last 6 months? {fallsyesno:27318}  LIVING ENVIRONMENT: Lives with: {OPRC lives with:25569::lives with their family} Lives in: {Lives in:25570} Stairs: {opstairs:27293} Has  following equipment at home: {Assistive devices:23999}  PLOF: {PLOF:24004}  PATIENT GOALS: ***  OBJECTIVE:  Note: Objective measures were completed at Evaluation unless otherwise noted.  HAND DOMINANCE: {MISC; OT HAND [phone]}  ADLs: Overall ADLs: *** Transfers/ambulation related to ADLs: Eating: *** Grooming: *** UB Dressing: *** LB Dressing: *** Toileting: *** Bathing: *** Tub Shower transfers: *** Equipment: {equipment:25573}  IADLs: Shopping: *** Light housekeeping: *** Meal Prep: *** Community mobility: *** Medication management: *** Financial management: *** Handwriting: {OTWRITTENEXPRESSION:25361}  MOBILITY STATUS: {OTMOBILITY:25360}  POSTURE COMMENTS:  {posture:25561} Sitting balance: {sitting balance:25483}  ACTIVITY TOLERANCE: Activity tolerance: ***  FUNCTIONAL OUTCOME MEASURES: {OTFUNCTIONALMEASURES:27238}  UPPER EXTREMITY ROM:    {AROM/PROM:27142} ROM Right eval Left eval  Shoulder flexion    Shoulder abduction    Shoulder adduction    Shoulder extension    Shoulder internal rotation    Shoulder external rotation    Elbow flexion    Elbow extension    Wrist flexion    Wrist extension    Wrist ulnar deviation    Wrist radial deviation    Wrist pronation    Wrist supination    (Blank rows = not tested)  UPPER EXTREMITY MMT:     MMT Right eval Left eval  Shoulder flexion    Shoulder abduction    Shoulder adduction    Shoulder extension    Shoulder internal rotation    Shoulder external rotation    Middle trapezius    Lower trapezius    Elbow flexion    Elbow extension    Wrist flexion    Wrist extension    Wrist ulnar deviation    Wrist radial deviation    Wrist pronation    Wrist supination    (Blank rows = not tested)  HAND FUNCTION: {handfunction:27230}  COORDINATION: {otcoordination:27237}  SENSATION: {sensation:27233}  EDEMA: ***  MUSCLE TONE: {UETONE:25567}  COGNITION: Overall cognitive  status: {cognition:24006}  VISION: Subjective report: *** Baseline vision: {OTBASELINEVISION:25363} Visual history: {OTVISUALHISTORY:25364}  VISION ASSESSMENT: {visionassessment:27231}  Patient has difficulty with following activities due to following visual impairments: ***  PERCEPTION: {Perception:25564}  PRAXIS: {Praxis:25565}  OBSERVATIONS: ***                                                                                                                             TREATMENT DATE: ***         PATIENT EDUCATION: Education details: *** Person educated: {Person educated:25204} Education method: {Education Method:25205} Education comprehension: {Education Comprehension:25206}  HOME EXERCISE PROGRAM: ***   GOALS: Goals reviewed with patient? {yes/no:20286}  SHORT TERM GOALS: Target date: ***  *** Baseline: Goal status: INITIAL  2.  *** Baseline:  Goal status: INITIAL  3.  *** Baseline:  Goal status: INITIAL  4.  ***  Baseline:  Goal status: INITIAL  5.  *** Baseline:  Goal status: INITIAL  6.  *** Baseline:  Goal status: INITIAL  LONG TERM GOALS: Target date: ***  *** Baseline:  Goal status: INITIAL  2.  *** Baseline:  Goal status: INITIAL  3.  *** Baseline:  Goal status: INITIAL  4.  *** Baseline:  Goal status: INITIAL  5.  *** Baseline:  Goal status: INITIAL  6.  *** Baseline:  Goal status: INITIAL  ASSESSMENT:  CLINICAL IMPRESSION:  Jacqueline Bonilla is a 69 y.o. female who was hospitalized 09/05/23 with right-sided facial droop and slurred speech. When she arrived in the ED, she began seizing. CT head was negative for any acute processes.  Multiple remote infarcts as well as chronic small vessel disease with parenchymal volume loss was noted. PMH significant of 2 left MCA territory infarcts participating in the [place] trial, loop recorder placement, vascular dementia, sleep apnea, HLD, thrombocytopenia, osteoporosis,  HTN, depression, anxiety and anemia. Pt presents to occupational therapy with the performance deficits below. Pt can benefit from skilled occupational therapy to address these deficits in order to maximize pt's safety and I with ADLs/IADLs.  PERFORMANCE DEFICITS: in functional skills including {OT physical skills:25468}, cognitive skills including {OT cognitive skills:25469}, and psychosocial skills including {OT psychosocial skills:25470}.   IMPAIRMENTS: are limiting patient from {OT performance deficits:25471}.   CO-MORBIDITIES: {Comorbidities:25485} that affects occupational performance. Patient will benefit from skilled OT to address above impairments and improve overall function.  MODIFICATION OR ASSISTANCE TO COMPLETE EVALUATION: {OT modification:25474}  OT OCCUPATIONAL PROFILE AND HISTORY: {OT PROFILE AND HISTORY:25484}  CLINICAL DECISION MAKING: {OT CDM:25475}  REHAB POTENTIAL: {rehabpotential:25112}  EVALUATION COMPLEXITY: {Evaluation complexity:25115}    PLAN:  OT FREQUENCY: {rehab frequency:25116}  OT DURATION: {rehab duration:25117}  PLANNED INTERVENTIONS: {OT Interventions:25467}  RECOMMENDED OTHER SERVICES: ***  CONSULTED AND AGREED WITH PLAN OF CARE: {ENR:74513}  PLAN FOR NEXT SESSION: ***   Sumire Halbleib, OT 01/28/2024, 11:03 AM           "

## 2024-01-30 ENCOUNTER — Ambulatory Visit: Admitting: Physical Therapy

## 2024-01-30 ENCOUNTER — Encounter: Payer: Self-pay | Admitting: Physical Therapy

## 2024-01-30 ENCOUNTER — Ambulatory Visit: Admitting: Occupational Therapy

## 2024-01-30 DIAGNOSIS — R278 Other lack of coordination: Secondary | ICD-10-CM

## 2024-01-30 DIAGNOSIS — M6281 Muscle weakness (generalized): Secondary | ICD-10-CM

## 2024-01-30 DIAGNOSIS — R2681 Unsteadiness on feet: Secondary | ICD-10-CM

## 2024-01-30 NOTE — Therapy (Signed)
 " OUTPATIENT PHYSICAL THERAPY NEURO TREATMENT   Patient Name: Jacqueline Bonilla MRN: [id number] DOB:Jun 17, 1955, 69 y.o., female Today's Date: 01/30/2024  PCP: Purcell Emil Schanz, MD REFERRING PROVIDER: Purcell Emil Schanz, MD  END OF SESSION:  PT End of Session - 01/30/24 0946     Visit Number 8    Date for Recertification  03/24/24    Authorization Type [employer]    Authorization Time Period 24 visits by 03/24/24    Authorization - Visit Number 8    Authorization - Number of Visits 24    Progress Note Due on Visit 10    PT Start Time 0946    PT Stop Time 1029    PT Time Calculation (min) 43 min    Equipment Utilized During Treatment Gait belt    Activity Tolerance Patient tolerated treatment well;Patient limited by fatigue    Behavior During Therapy [facility] for tasks assessed/performed;Flat affect               Past Medical History:  Diagnosis Date   Acute ischemic left MCA stroke (HCC) 04/01/2023   right hemiparesis   Anxiety    Cognitive impairment    Depression    Dyslipidemia    Dysmenorrhea    Endometriosis    Fibroid    Frequent falls    Gait instability    Hypertension    Implantable loop recorder present 2021   Normocytic anemia    Osteoporosis    Seizure disorder (HCC)    Statin intolerance    Tachycardia    Vascular dementia [facility])    Past Surgical History:  Procedure Laterality Date   BREAST BIOPSY     BUBBLE STUDY  12/08/2020   Procedure: BUBBLE STUDY;  Surgeon: Delford Maude BROCKS, MD;  Location: [facility] ENDOSCOPY;  Service: Cardiovascular;;   CESAREAN SECTION     hysteroscopic resection     implantable loop recorder implant  10/21/2019   Medtronic Reveal Linq model LNQ 22 ([place] [id number]) implantable loop recorder   IR CT HEAD LTD  12/01/2020   IR PERCUTANEOUS ART THROMBECTOMY/INFUSION INTRACRANIAL INC DIAG ANGIO  12/01/2020   IR US  GUIDE VASC ACCESS RIGHT  12/01/2020   ORIF HUMERUS FRACTURE Left 07/31/2021   Procedure: OPEN REDUCTION  INTERNAL FIXATION (ORIF) DISTAL HUMERUS FRACTURE;  Surgeon: Kendal Franky SHAUNNA, MD;  Location: [facility] OR;  Service: Orthopedics;  Laterality: Left;   RADIOLOGY WITH ANESTHESIA N/A 12/01/2020   Procedure: IR WITH ANESTHESIA - CODE STROKE;  Surgeon: Radiologist, Medication, MD;  Location: [facility] OR;  Service: Radiology;  Laterality: N/A;   TEE WITHOUT CARDIOVERSION N/A 12/08/2020   Procedure: TRANSESOPHAGEAL ECHOCARDIOGRAM (TEE);  Surgeon: Delford Maude BROCKS, MD;  Location: [facility] ENDOSCOPY;  Service: Cardiovascular;  Laterality: N/A;   Patient Active Problem List   Diagnosis Date Noted   Pressure injury of skin 08/19/2023   Malnutrition of moderate degree 08/16/2023   Seizure (HCC) 08/15/2023   Gait disturbance, 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   Hemiparesis affecting right side as late effect of cerebrovascular accident (CVA) (HCC) 04/23/2023   Apraxia, 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   Frequent falls 02/11/2023   Statin intolerance 04/24/2022   Internal hemorrhoids 01/10/2021   Dyslipidemia 12/29/2020   Seizure-like activity (HCC) 08/30/2020   History of CVA (cerebrovascular accident) 07/14/2020   Normocytic anemia 07/11/2020   General weakness 06/24/2020   Vascular dementia without behavioral disturbance (HCC) 07/15/2019   Essential hypertension 03/03/2019    ONSET DATE: Chronic  REFERRING DIAG: Z86.73 - Hx of Stroke  THERAPY DIAG:  Muscle weakness (generalized)  Other lack of coordination  Unsteadiness on feet  Rationale for Evaluation and Treatment: Rehabilitation  SUBJECTIVE:                                                                                                                                                                                             SUBJECTIVE STATEMENT:  Per pt: I don't know.   Per spouse- missed OT and accidentally wrote down OT as being after PT this morning. She did some stomach exercises and pulled her knees up from being on her stomach. Still having some dizziness this  morning. Had a fall this morning when she was walking by herself without a walker, she was able to get up by herself with me just walking her through it. Trips on walker.     EVAL: Pt husband stated he assists her in all daily tasks including transfers, dressing, and toileting. Pt husband stated previous aquatic therapy helped in her mobility and did receive home therapy for a time.  Pt accompanied by: significant other  PERTINENT HISTORY: See PMH chart above; noted seizure and stroke hx  PAIN:  Little bit of pain in R wrist but that's normal/constant, not worse with the cold; nothing makes it worse and better no number given on NPRS scale   PRECAUTIONS: None  RED FLAGS: None   WEIGHT BEARING RESTRICTIONS: No  FALLS: Has patient fallen in last 6 months? Yes. Number of falls 12  LIVING ENVIRONMENT: Lives with: lives with their family and lives with their spouse Lives in: House/apartment Stairs: Yes: External: 4 steps; Bilateral Has following equipment at home: Vannie - 2 wheeled and Wheelchair (manual)  PLOF: Needs assistance with ADLs, Needs assistance with homemaking, Needs assistance with gait, and Needs assistance with transfers  PATIENT GOALS: Pt husband agreeable that functional mobility improvement in order to make day to day tasks easier for him and pt to perform is the biggest goal.   OBJECTIVE:  Note: Objective measures were completed at Evaluation unless otherwise noted.  DIAGNOSTIC FINDINGS: CLINICAL HISTORY: weakness   FINDINGS:   LUNGS AND PLEURA: No focal pulmonary opacity. No pleural effusion. No pneumothorax.   HEART AND MEDIASTINUM: No acute abnormality of the cardiac and mediastinal silhouettes.   BONES AND SOFT TISSUES: No acute osseous abnormality.   IMPRESSION: 1. No acute cardiopulmonary process.   Electronically signed by: Lynwood Seip MD 11/19/2023 01:56 PM EST RP  COGNITION: Overall cognitive status: History of cognitive impairments - at  baseline   SENSATION: WFL  COORDINATION: Finger to nose: unable to achieve on LUE  Increased time to perform on  R side with past-pointing  EDEMA:  Visible swelling in bilateral ankles   POSTURE: rounded shoulders  LOWER EXTREMITY ROM:     Active  Right Eval Left Eval  Hip flexion WNL WNL  Hip extension    Hip abduction WNL WNL  Hip adduction WNL WNL  Hip internal rotation    Hip external rotation    Knee flexion WNL WNL  Knee extension WNL WNL  Ankle dorsiflexion    Ankle plantarflexion    Ankle inversion    Ankle eversion     (Blank rows = not tested)  LOWER EXTREMITY MMT:    Shoulder flex- 3+/5 bilaterally  MMT Right Eval Left Eval R 01/20/24 L 01/20/24  Hip flexion 3+/5 3+/5 3 3   Hip extension      Hip abduction      Hip adduction      Hip internal rotation      Hip external rotation      Knee flexion 3+/5 3+/5 3 3   Knee extension 4-/5 4-/5 4- 4-  Ankle dorsiflexion      Ankle plantarflexion      Ankle inversion      Ankle eversion      (Blank rows = not tested)  TRANSFERS: Sit to stand: Min A  Assistive device utilized: Environmental Consultant - 2 wheeled       GAIT: Findings: Distance walked: In Clinic and Comments: ModA required for RW navigation and ambulation; pt presents with L sided neglect   FUNCTIONAL TESTS:   Plan to perform TUG                                                                                                                                TREATMENT DATE:   01/30/24  Standing without RW- working on finding balance and center without leaning on WC, reducing posterior lean. Mod multimodal cues for upright midline posture with mirror for visual feedback  Standing forward dowel taps to cones stacked on stool- mod multimodal cues for finding lateral midline and countering posterior lean  Standing cross midline reaches for cones on table/passing them off to PT- mirror and multimodal cues for midline and sequencing (same height/level of  motion) Standing cross midline reaches for cones on lower stool to higher mat table (increased difficulty of levels with reaches)- mirror and multimodal cues for finding midline and sequencing      01/23/24   Seated cone taps with 2# Wate with mirror for visual feedback, did much better finding midline but still needed cues  Attempted standing midline tasks, however pt was distracted by other therapist/pt who had entered space- changed spaces and she was able to perform much better Cross midline reaches to L with moving target from PT Worked on finding midline in standing with Mod multimodal cues from PT with RW Discussed PT care with husband, also discussed ways to work on midline standing at home using visual feedback via mirror  Gait approximately 9ft + 61ft, RW, ModA/Mod multimodal cues       PATIENT EDUCATION: Education details: Discussion on current HEP pt is performing  Person educated: Patient and Spouse Education method: Explanation Education comprehension: verbalized understanding  HOME EXERCISE PROGRAM: Plan to add to current HEP given from previous PT  GOALS: Goals reviewed with patient? Yes  SHORT TERM GOALS: Target date: 02/11/24  Pt will be able to perform all initial HEP once given with assist from spouse  Baseline: Goal status: ONGOING 1/19    LONG TERM GOALS: Target date: 03/25/23  Pt will be able to ambulate 100' with LRAD utilizing CGA in order to progress household mobility Baseline:  Goal status: ONGOING 1/19  2.  Pt will be able to perform and complete TUG <20s utilizing minA Baseline:  Goal status: INITIAL  3.  Pt will perform STS, chair to chair. And car transfers utilizing minA with ability to use bilateral UE on RW Baseline:  Goal status: ONGOING 1/19  4.  Pt will be able to navigate stairs in order to enter home with railing use utilizing CGA Baseline:  Goal status: INITIAL   ASSESSMENT:  CLINICAL IMPRESSION:  Arrives today  doing OK, we worked on standing activities without RW today as spouse reports she trips on this a lot at home and I have noticed this in clinic as well. Still with heavy posterior lean in standing but able to find midline laterally with cues. Remains easily fatigued but really did well with soft, controlled stand to sit transfers today. Will continue to work towards all functional goals and reducing fall risk.    EVAL: Patient is a 69 y.o. female who was seen today for physical therapy evaluation and treatment for residual affects from previous stroke history. She enters in a wheelchair. Pt with deficits in transfers and general mobility with frequent hx of repeated falls. Pt noted to be impulsive with movements and also has deficits in safety awareness. Pt unable to navigate with RW independently. Pt presents with L neglect and has problems with movement initiation. Pt exhibits a shuffling gait upon initiation and required modA to ambulate. Pt unable to visualize L hand to place onto walker; requires tactile and verbal cues. She will benefit from PT to improve functional mobility to allow ease with ADLs and transfers. Planned to get OT referral to work on left-right coordination, tremors, and apraxia.   OBJECTIVE IMPAIRMENTS: Abnormal gait, decreased balance, decreased cognition, decreased coordination, decreased endurance, decreased mobility, difficulty walking, decreased strength, and decreased safety awareness.   ACTIVITY LIMITATIONS: carrying, lifting, bending, stairs, transfers, bed mobility, toileting, dressing, and self feeding  PARTICIPATION LIMITATIONS: meal prep, cleaning, laundry, medication management, driving, and community activity  PERSONAL FACTORS: Age, Past/current experiences, Time since onset of injury/illness/exacerbation, and 1-2 comorbidities:   are also affecting patient's functional outcome.   REHAB POTENTIAL: Good  CLINICAL DECISION MAKING: Evolving/moderate  complexity  EVALUATION COMPLEXITY: Moderate  PLAN:  PT FREQUENCY: 2x/week  PT DURATION: 12 weeks  PLANNED INTERVENTIONS: 97110-Therapeutic exercises, 97530- Therapeutic activity, [id number]- Neuromuscular re-education, 97535- Self Care, [id number]- Manual therapy, [phone]- Gait training, and Patient/Family education  PLAN FOR NEXT SESSION: General muscle strengthening, transfer training, pre-gait activities. Keep working on addressing L neglect/improving attention. Reinforce safety at home/try to reduce fall risk outside of clinic. Keep using mirror to find midline and reduce posterior lean in standing, consider trying gait without RW pending on how well she does with progressing standing tasks  Josette Rough, PT, DPT 01/30/24  10:33 AM   "

## 2024-02-04 ENCOUNTER — Encounter: Payer: Self-pay | Admitting: Physical Therapy

## 2024-02-04 ENCOUNTER — Other Ambulatory Visit: Payer: Self-pay

## 2024-02-04 ENCOUNTER — Encounter: Payer: Self-pay | Admitting: Occupational Therapy

## 2024-02-04 ENCOUNTER — Ambulatory Visit: Admitting: Occupational Therapy

## 2024-02-04 ENCOUNTER — Ambulatory Visit: Admitting: Physical Therapy

## 2024-02-04 DIAGNOSIS — R2681 Unsteadiness on feet: Secondary | ICD-10-CM

## 2024-02-04 DIAGNOSIS — I69351 Hemiplegia and hemiparesis following cerebral infarction affecting right dominant side: Secondary | ICD-10-CM

## 2024-02-04 DIAGNOSIS — R41842 Visuospatial deficit: Secondary | ICD-10-CM

## 2024-02-04 DIAGNOSIS — R2689 Other abnormalities of gait and mobility: Secondary | ICD-10-CM

## 2024-02-04 DIAGNOSIS — R41840 Attention and concentration deficit: Secondary | ICD-10-CM

## 2024-02-04 DIAGNOSIS — M6281 Muscle weakness (generalized): Secondary | ICD-10-CM

## 2024-02-04 DIAGNOSIS — R41844 Frontal lobe and executive function deficit: Secondary | ICD-10-CM

## 2024-02-04 DIAGNOSIS — M25531 Pain in right wrist: Secondary | ICD-10-CM

## 2024-02-04 DIAGNOSIS — R278 Other lack of coordination: Secondary | ICD-10-CM

## 2024-02-04 DIAGNOSIS — R29818 Other symptoms and signs involving the nervous system: Secondary | ICD-10-CM

## 2024-02-05 NOTE — Therapy (Signed)
 " OUTPATIENT PHYSICAL THERAPY NEURO TREATMENT   Patient Name: Jacqueline Bonilla MRN: [id number] DOB:11/23/1955, 69 y.o., female Today's Date: 02/05/2024  PCP: Purcell Emil Schanz, MD REFERRING PROVIDER: Purcell Emil Schanz, MD  END OF SESSION:         Past Medical History:  Diagnosis Date   Acute ischemic left MCA stroke (HCC) 04/01/2023   right hemiparesis   Anxiety    Cognitive impairment    Depression    Dyslipidemia    Dysmenorrhea    Endometriosis    Fibroid    Frequent falls    Gait instability    Hypertension    Implantable loop recorder present 2021   Normocytic anemia    Osteoporosis    Seizure disorder (HCC)    Statin intolerance    Tachycardia    Vascular dementia [facility])    Past Surgical History:  Procedure Laterality Date   BREAST BIOPSY     BUBBLE STUDY  12/08/2020   Procedure: BUBBLE STUDY;  Surgeon: Delford Maude BROCKS, MD;  Location: [facility] ENDOSCOPY;  Service: Cardiovascular;;   CESAREAN SECTION     hysteroscopic resection     implantable loop recorder implant  10/21/2019   Medtronic Reveal Linq model LNQ 22 ([place] [id number]) implantable loop recorder   IR CT HEAD LTD  12/01/2020   IR PERCUTANEOUS ART THROMBECTOMY/INFUSION INTRACRANIAL INC DIAG ANGIO  12/01/2020   IR US  GUIDE VASC ACCESS RIGHT  12/01/2020   ORIF HUMERUS FRACTURE Left 07/31/2021   Procedure: OPEN REDUCTION INTERNAL FIXATION (ORIF) DISTAL HUMERUS FRACTURE;  Surgeon: Kendal Franky SHAUNNA, MD;  Location: [facility] OR;  Service: Orthopedics;  Laterality: Left;   RADIOLOGY WITH ANESTHESIA N/A 12/01/2020   Procedure: IR WITH ANESTHESIA - CODE STROKE;  Surgeon: Radiologist, Medication, MD;  Location: [facility] OR;  Service: Radiology;  Laterality: N/A;   TEE WITHOUT CARDIOVERSION N/A 12/08/2020   Procedure: TRANSESOPHAGEAL ECHOCARDIOGRAM (TEE);  Surgeon: Delford Maude BROCKS, MD;  Location: [facility] ENDOSCOPY;  Service: Cardiovascular;  Laterality: N/A;   Patient Active Problem List   Diagnosis Date Noted   Pressure injury  of skin 08/19/2023   Malnutrition of moderate degree 08/16/2023   Seizure (HCC) 08/15/2023   Gait disturbance, 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   Hemiparesis affecting right side as late effect of cerebrovascular accident (CVA) (HCC) 04/23/2023   Apraxia, 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   Frequent falls 02/11/2023   Statin intolerance 04/24/2022   Internal hemorrhoids 01/10/2021   Dyslipidemia 12/29/2020   Seizure-like activity (HCC) 08/30/2020   History of CVA (cerebrovascular accident) 07/14/2020   Normocytic anemia 07/11/2020   General weakness 06/24/2020   Vascular dementia without behavioral disturbance (HCC) 07/15/2019   Essential hypertension 03/03/2019    ONSET DATE: Chronic  REFERRING DIAG: Z86.73 - Hx of Stroke  THERAPY DIAG:  No diagnosis found.  Rationale for Evaluation and Treatment: Rehabilitation  SUBJECTIVE:  SUBJECTIVE STATEMENT: ***  Per pt: I don't know.   Per spouse- missed OT and accidentally wrote down OT as being after PT this morning. She did some stomach exercises and pulled her knees up from being on her stomach. Still having some dizziness this morning. Had a fall this morning when she was walking by herself without a walker, she was able to get up by herself with me just walking her through it. Trips on walker.     EVAL: Pt husband stated he assists her in all daily tasks including transfers, dressing, and toileting. Pt husband stated previous aquatic therapy helped in her mobility and did receive home therapy for a time.  Pt accompanied by: significant other  PERTINENT HISTORY: See PMH chart above; noted seizure and stroke hx  PAIN:  Little bit of pain in R wrist but that's normal/constant, not worse with the cold; nothing makes it worse and better no number given on NPRS  scale   PRECAUTIONS: None  RED FLAGS: None   WEIGHT BEARING RESTRICTIONS: No  FALLS: Has patient fallen in last 6 months? Yes. Number of falls 12  LIVING ENVIRONMENT: Lives with: lives with their family and lives with their spouse Lives in: House/apartment Stairs: Yes: External: 4 steps; Bilateral Has following equipment at home: Vannie - 2 wheeled and Wheelchair (manual)  PLOF: Needs assistance with ADLs, Needs assistance with homemaking, Needs assistance with gait, and Needs assistance with transfers  PATIENT GOALS: Pt husband agreeable that functional mobility improvement in order to make day to day tasks easier for him and pt to perform is the biggest goal.   OBJECTIVE:  Note: Objective measures were completed at Evaluation unless otherwise noted.  DIAGNOSTIC FINDINGS: CLINICAL HISTORY: weakness   FINDINGS:   LUNGS AND PLEURA: No focal pulmonary opacity. No pleural effusion. No pneumothorax.   HEART AND MEDIASTINUM: No acute abnormality of the cardiac and mediastinal silhouettes.   BONES AND SOFT TISSUES: No acute osseous abnormality.   IMPRESSION: 1. No acute cardiopulmonary process.   Electronically signed by: Lynwood Seip MD 11/19/2023 01:56 PM EST RP  COGNITION: Overall cognitive status: History of cognitive impairments - at baseline   SENSATION: WFL  COORDINATION: Finger to nose: unable to achieve on LUE  Increased time to perform on R side with past-pointing  EDEMA:  Visible swelling in bilateral ankles   POSTURE: rounded shoulders  LOWER EXTREMITY ROM:     Active  Right Eval Left Eval  Hip flexion WNL WNL  Hip extension    Hip abduction WNL WNL  Hip adduction WNL WNL  Hip internal rotation    Hip external rotation    Knee flexion WNL WNL  Knee extension WNL WNL  Ankle dorsiflexion    Ankle plantarflexion    Ankle inversion    Ankle eversion     (Blank rows = not tested)  LOWER EXTREMITY MMT:    Shoulder flex- 3+/5  bilaterally  MMT Right Eval Left Eval R 01/20/24 L 01/20/24  Hip flexion 3+/5 3+/5 3 3   Hip extension      Hip abduction      Hip adduction      Hip internal rotation      Hip external rotation      Knee flexion 3+/5 3+/5 3 3   Knee extension 4-/5 4-/5 4- 4-  Ankle dorsiflexion      Ankle plantarflexion      Ankle inversion      Ankle eversion      (  Blank rows = not tested)  TRANSFERS: Sit to stand: Min A  Assistive device utilized: Environmental Consultant - 2 wheeled       GAIT: Findings: Distance walked: In Clinic and Comments: ModA required for RW navigation and ambulation; pt presents with L sided neglect   FUNCTIONAL TESTS:   Plan to perform TUG                                                                                                                                TREATMENT DATE:   02/06/24 ***  02/04/24 Nustep level 4 x 6 minutes 3# LAQ 2x10 3# seated marches 2x10 Standing with walker and CGA 3# hip abd Standing with walker and CGA 4 toe touches Seated PWR moves Seated ball toss and catch Ball b/n knees  Red tband clamshells Cross body reach to cones, in front and then behind same side arm and then opposite side arm Partial sit ups HHA gait cues for big/smooth steps in clinic, then 2 x 100 feet, cues for turning, cues to reach back for the chair and control descent  01/30/24  Standing without RW- working on finding balance and center without leaning on WC, reducing posterior lean. Mod multimodal cues for upright midline posture with mirror for visual feedback  Standing forward dowel taps to cones stacked on stool- mod multimodal cues for finding lateral midline and countering posterior lean  Standing cross midline reaches for cones on table/passing them off to PT- mirror and multimodal cues for midline and sequencing (same height/level of motion) Standing cross midline reaches for cones on lower stool to higher mat table (increased difficulty of levels with reaches)-  mirror and multimodal cues for finding midline and sequencing      01/23/24   Seated cone taps with 2# Wate with mirror for visual feedback, did much better finding midline but still needed cues  Attempted standing midline tasks, however pt was distracted by other therapist/pt who had entered space- changed spaces and she was able to perform much better Cross midline reaches to L with moving target from PT Worked on finding midline in standing with Mod multimodal cues from PT with RW Discussed PT care with husband, also discussed ways to work on midline standing at home using visual feedback via mirror   Gait approximately 56ft + 48ft, RW, ModA/Mod multimodal cues       PATIENT EDUCATION: Education details: Discussion on current HEP pt is performing  Person educated: Patient and Spouse Education method: Explanation Education comprehension: verbalized understanding  HOME EXERCISE PROGRAM: Plan to add to current HEP given from previous PT  GOALS: Goals reviewed with patient? Yes  SHORT TERM GOALS: Target date: 02/11/24  Pt will be able to perform all initial HEP once given with assist from spouse  Baseline: Goal status: ONGOING 1/19    LONG TERM GOALS: Target date: 03/25/23  Pt will be able to ambulate 100' with LRAD utilizing CGA in  order to progress household mobility Baseline:  Goal status: ONGOING 02/04/24  2.  Pt will be able to perform and complete TUG <20s utilizing minA Baseline:  Goal status: INITIAL  3.  Pt will perform STS, chair to chair. And car transfers utilizing minA with ability to use bilateral UE on RW Baseline:  Goal status: ONGOING 02/04/24  4.  Pt will be able to navigate stairs in order to enter home with railing use utilizing CGA Baseline:  Goal status: INITIAL   ASSESSMENT:  CLINICAL IMPRESSION: ***  Patients husband reports an injury so we had to go out to the car and help her in and out of the car.  She needs a lot of verbal and tactile  cues for turning and to reach back for the chair as she tends to plop down hard, with the start of gait and with turning she tends to festinate, can smooth out with verbal cues.  . Still with heavy posterior lean in standing but able to find midline laterally with cues. Remains easily fatigued but really did well with soft, controlled stand to sit transfers today. Will continue to work towards all functional goals and reducing fall risk.    EVAL: Patient is a 69 y.o. female who was seen today for physical therapy evaluation and treatment for residual affects from previous stroke history. She enters in a wheelchair. Pt with deficits in transfers and general mobility with frequent hx of repeated falls. Pt noted to be impulsive with movements and also has deficits in safety awareness. Pt unable to navigate with RW independently. Pt presents with L neglect and has problems with movement initiation. Pt exhibits a shuffling gait upon initiation and required modA to ambulate. Pt unable to visualize L hand to place onto walker; requires tactile and verbal cues. She will benefit from PT to improve functional mobility to allow ease with ADLs and transfers. Planned to get OT referral to work on left-right coordination, tremors, and apraxia.   OBJECTIVE IMPAIRMENTS: Abnormal gait, decreased balance, decreased cognition, decreased coordination, decreased endurance, decreased mobility, difficulty walking, decreased strength, and decreased safety awareness.   ACTIVITY LIMITATIONS: carrying, lifting, bending, stairs, transfers, bed mobility, toileting, dressing, and self feeding  PARTICIPATION LIMITATIONS: meal prep, cleaning, laundry, medication management, driving, and community activity  PERSONAL FACTORS: Age, Past/current experiences, Time since onset of injury/illness/exacerbation, and 1-2 comorbidities:   are also affecting patient's functional outcome.   REHAB POTENTIAL: Good  CLINICAL DECISION MAKING:  Evolving/moderate complexity  EVALUATION COMPLEXITY: Moderate  PLAN:  PT FREQUENCY: 2x/week  PT DURATION: 12 weeks  PLANNED INTERVENTIONS: 97110-Therapeutic exercises, 97530- Therapeutic activity, [id number]- Neuromuscular re-education, 97535- Self Care, [id number]- Manual therapy, [phone]- Gait training, and Patient/Family education  PLAN FOR NEXT SESSION: General muscle strengthening, transfer training, pre-gait activities. Keep working on addressing L neglect/improving attention. Reinforce safety at home/try to reduce fall risk outside of clinic. Keep using mirror to find midline and reduce posterior lean in standing, consider trying gait without RW pending on how well she does with progressing standing tasks   Stann Ohara PT, DPT, CLT 02/05/24 3:53 PM   "

## 2024-02-06 ENCOUNTER — Ambulatory Visit: Admitting: Physical Therapy

## 2024-02-06 ENCOUNTER — Ambulatory Visit: Admitting: Occupational Therapy

## 2024-02-06 ENCOUNTER — Encounter: Payer: Self-pay | Admitting: Physical Therapy

## 2024-02-06 DIAGNOSIS — R2689 Other abnormalities of gait and mobility: Secondary | ICD-10-CM

## 2024-02-06 DIAGNOSIS — I69351 Hemiplegia and hemiparesis following cerebral infarction affecting right dominant side: Secondary | ICD-10-CM

## 2024-02-06 DIAGNOSIS — R278 Other lack of coordination: Secondary | ICD-10-CM

## 2024-02-06 DIAGNOSIS — R41840 Attention and concentration deficit: Secondary | ICD-10-CM

## 2024-02-06 DIAGNOSIS — M25531 Pain in right wrist: Secondary | ICD-10-CM

## 2024-02-06 DIAGNOSIS — M6281 Muscle weakness (generalized): Secondary | ICD-10-CM

## 2024-02-06 DIAGNOSIS — R2681 Unsteadiness on feet: Secondary | ICD-10-CM

## 2024-02-06 DIAGNOSIS — R29818 Other symptoms and signs involving the nervous system: Secondary | ICD-10-CM

## 2024-02-06 DIAGNOSIS — R41842 Visuospatial deficit: Secondary | ICD-10-CM

## 2024-02-06 NOTE — Therapy (Signed)
 " OUTPATIENT OCCUPATIONAL THERAPY NEURO EVALUATION  Patient Name: Jacqueline Bonilla MRN: [id number] DOB:Jun 03, 1955, 69 y.o., female Today's Date: 02/06/2024  PCP: Dr. Purcell REFERRING PROVIDER: Dr. Purcell  END OF SESSION:  OT End of Session - 02/06/24 1013     Visit Number 2    Number of Visits 25    Date for Recertification  04/28/24    Authorization Type [employer]    OT Start Time 0932    OT Stop Time 1014    OT Time Calculation (min) 42 min           Past Medical History:  Diagnosis Date   Acute ischemic left MCA stroke (HCC) 04/01/2023   right hemiparesis   Anxiety    Cognitive impairment    Depression    Dyslipidemia    Dysmenorrhea    Endometriosis    Fibroid    Frequent falls    Gait instability    Hypertension    Implantable loop recorder present 2021   Normocytic anemia    Osteoporosis    Seizure disorder (HCC)    Statin intolerance    Tachycardia    Vascular dementia [facility])    Past Surgical History:  Procedure Laterality Date   BREAST BIOPSY     BUBBLE STUDY  12/08/2020   Procedure: BUBBLE STUDY;  Surgeon: Delford Maude BROCKS, MD;  Location: [facility] ENDOSCOPY;  Service: Cardiovascular;;   CESAREAN SECTION     hysteroscopic resection     implantable loop recorder implant  10/21/2019   Medtronic Reveal Linq model LNQ 22 ([place] [id number]) implantable loop recorder   IR CT HEAD LTD  12/01/2020   IR PERCUTANEOUS ART THROMBECTOMY/INFUSION INTRACRANIAL INC DIAG ANGIO  12/01/2020   IR US  GUIDE VASC ACCESS RIGHT  12/01/2020   ORIF HUMERUS FRACTURE Left 07/31/2021   Procedure: OPEN REDUCTION INTERNAL FIXATION (ORIF) DISTAL HUMERUS FRACTURE;  Surgeon: Kendal Franky SHAUNNA, MD;  Location: [facility] OR;  Service: Orthopedics;  Laterality: Left;   RADIOLOGY WITH ANESTHESIA N/A 12/01/2020   Procedure: IR WITH ANESTHESIA - CODE STROKE;  Surgeon: Radiologist, Medication, MD;  Location: [facility] OR;  Service: Radiology;  Laterality: N/A;   TEE WITHOUT CARDIOVERSION N/A 12/08/2020   Procedure:  TRANSESOPHAGEAL ECHOCARDIOGRAM (TEE);  Surgeon: Delford Maude BROCKS, MD;  Location: [facility] ENDOSCOPY;  Service: Cardiovascular;  Laterality: N/A;   Patient Active Problem List   Diagnosis Date Noted   Pressure injury of skin 08/19/2023   Malnutrition of moderate degree 08/16/2023   Seizure (HCC) 08/15/2023   Gait disturbance, 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   Hemiparesis affecting right side as late effect of cerebrovascular accident (CVA) (HCC) 04/23/2023   Apraxia, 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   Frequent falls 02/11/2023   Statin intolerance 04/24/2022   Internal hemorrhoids 01/10/2021   Dyslipidemia 12/29/2020   Seizure-like activity (HCC) 08/30/2020   History of CVA (cerebrovascular accident) 07/14/2020   Normocytic anemia 07/11/2020   General weakness 06/24/2020   Vascular dementia without behavioral disturbance (HCC) 07/15/2019   Essential hypertension 03/03/2019    ONSET DATE: 09/05/23- recent hospitalization  REFERRING DIAG:  Z86.73 (ICD-10-CM) - History of stroke  I69.351 (ICD-10-CM) - Hemiparesis affecting right side as late effect of cerebrovascular accident (CVA) (HCC)    THERAPY DIAG:  Muscle weakness (generalized)  Other lack of coordination  Unsteadiness on feet  Hemiplegia and hemiparesis following cerebral infarction affecting right dominant side (HCC)  Visuospatial deficit  Other symptoms and signs involving the nervous system  Other abnormalities of gait and mobility  Attention and concentration deficit  Pain in right wrist  Rationale for Evaluation and Treatment: Rehabilitation  SUBJECTIVE:   SUBJECTIVE STATEMENT: Pt reports R wrist pain Pt accompanied by: self  PERTINENT HISTORY: Jacqueline Bonilla is a 69 y.o. female who was hospitalized 09/05/23 with right-sided facial droop and slurred speech. When she arrived in the ED, she began seizing. CT head was negative for any acute processes.  Multiple remote infarcts as well as chronic small vessel disease with  parenchymal volume loss was noted. PMH significant of 2 left MCA territory infarcts participating in the [place] trial, loop recorder placement, vascular dementia, sleep apnea, HLD, thrombocytopenia, osteoporosis, HTN, depression, anxiety and anemia  PRECAUTIONS: Fall  WEIGHT BEARING RESTRICTIONS: No  PAIN:  Are you having pain? Yes: NPRS scale: 4-6/10 Pain location: R wrist Pain description: aching Aggravating factors: weightbearing Relieving factors: rest  FALLS: Has patient fallen in last 6 months? Yes. Number of falls several  LIVING ENVIRONMENT: Lives with: lives with their spouse Lives in: House/apartment   PLOF: Needs assistance with ADLs, Needs assistance with homemaking, Needs assistance with gait, and Needs assistance with transfers  PATIENT GOALS: to be more I with daily activities  OBJECTIVE:  Note: Objective measures were completed at Evaluation unless otherwise noted.  HAND DOMINANCE: Right  ADLs: Overall ADLs: needs assist Transfers/ambulation related to ADLs:mod A Eating: needs asssit cutting food Grooming: minA UB Dressing: dependent LB Dressing: dependent Toileting: mod A Bathing: walk in shower max A Tub Shower transfers: mod A     MOBILITY STATUS: Needs Assist: mod A      FUNCTIONAL OUTCOME MEASURES:  Quick Dash: 80% disability  UPPER EXTREMITY ROM:    Active ROM Right eval Left eval  Shoulder flexion 130 120  Shoulder abduction    Shoulder adduction    Shoulder extension    Shoulder internal rotation    Shoulder external rotation    Elbow flexion    Elbow extension -35 -35  Wrist flexion 45   Wrist extension -20   Wrist ulnar deviation    Wrist radial deviation    Wrist pronation    Wrist supination grossly 75%   (Blank rows = not tested) able to oppose digits with thumb bilaterally   HAND FUNCTION: Grip strength: Right: 5 lbs; Left: 4 lbs  COORDINATION: Box and Blocks:  Right 3blocks, Left 4blocks  SENSATION: Not  tested    MUSCLE TONE: RUE: Moderate and Hypertonic and LUE: Moderate and Hypertonic  COGNITION: Overall cognitive status: Impaired: Areas of impairment:   Memory: Impaired: Short term Executive function: Impaired: Initiation, Problem solving, and Slow processing  Orientation: Not oreitned to year, date, pt was oriented to place and situation  VISION ASSESSMENT:to be further assessed in a functional context Ocular ROM: restricted on looking up Tracking/Visual pursuits: Decreased smoothness with horizontal tracking Pt reports missing items on the right      OBSERVATIONS: Pleasant female who became tearful when discussing deficits  TREATMENT DATE:  02/06/24- Cane exercises in supine for shoulder flexion and chest press mod v.c and facilitation Seated Pushing rolling stool forwards and back wards and to left and right sides for shoulder flexion stretch min v.c and mod facilitation Seated on mat functional reaching activities with left and right UE's to place and remove cones from mat to table, mod v.c for head and body turns to left side to locate items on mat due to possible visual field deficit/ L inattention. Placing and removing wooden dowel pegs from pegboard using left and right UE's, mod difficulty for LUE functional use, min difficulty with right., min-mod v.c  Pt transferred back to w/c with min a and mod facilitation Toilet transfers using grab bar, min A, and mod A for clothing manipulation and mod v.c / facilitation for pt hygeine. 02/04/24- eval         PATIENT EDUCATION: Education details: role of OT, potential goals Person educated: Patient Education method: Explanation Education comprehension: verbalized understanding  HOME EXERCISE PROGRAM: n/a   GOALS: Goals reviewed with patient? Yes  SHORT TERM GOALS: Target date: 03/03/24  I  with inital HEP  Goal status: INITIAL  2.  Pt will donn shirt with mod A  Goal status: INITIAL  3.  Pt/ family will be I with compensatory strategies for visual perceptual deficits and cognitve deficits.   Goal status: INITIAL  4.  Pt will report feeding herself mod I with decreased spills using AE prn.  Goal status: INITIAL  5.  Pt will demonstrate improved bilateral UE functional use as evidenced by increaseing bilateral box/ blocks score by 3 blocks.  Goal status: INITIAL   6..Pt/ caregiver will demonstrate understanding of AE/DME to maximize pt's safety and I with ADLs/IADLs.  Goal status: inital  LONG TERM GOALS: Target date: 04/20/24  I with updated HEP  Goal status: INITIAL  2.  Pt will donn shirt and pants with min A  Goal status: INITIAL  3.  Pt will perform toilet transfers with min A  Goal status: INITIAL  4.  Pt will improve Quick DASH score to 75% or better. Baseline: 80% disability(lower score is better) Goal status: INITIAL  5.  Pt will improve bilateral A/ROM elbow extension to -25* or better for improved functional reach.  Goal status: INITIAL  6.  Pt will improve bilateral grip strength to 8 # or greater. Baseline: RUE 3#, LUE 4# Goal status: INITIAL    7.  Pt will perfrom bathing with min A    Goal status: inital  ASSESSMENT:  CLINICAL IMPRESSION:  CAMEKA RAE is a 69 y.o. female who was hospitalized 09/05/23 with right-sided facial droop and slurred speech. When she arrived in the ED, she began seizing. CT head was negative for any acute processes.  Multiple remote infarcts as well as chronic small vessel disease with parenchymal volume loss was noted. PMH significant of 2 left MCA territory infarcts participating in the [place] trial, loop recorder placement, vascular dementia, sleep apnea, HLD, thrombocytopenia, osteoporosis, HTN, depression, anxiety and anemia. Pt presents to occupational therapy with the performance deficits below.  Pt can benefit from skilled occupational therapy to address these deficits in order to maximize pt's safety and I with ADLs/IADLs.  PERFORMANCE DEFICITS: in functional skills including ADLs, IADLs, coordination, dexterity, tone, ROM, strength, pain, flexibility, Fine motor control, Gross motor control, mobility, balance, decreased knowledge of precautions, decreased knowledge of use of DME, vision, and UE functional use, cognitive skills including attention, emotional, learn,  memory, orientation, perception, problem solving, safety awareness, sequencing, thought, and understand, and psychosocial skills including coping strategies, environmental adaptation, habits, interpersonal interactions, and routines and behaviors.   IMPAIRMENTS: are limiting patient from ADLs, IADLs, rest and sleep, play, leisure, and social participation.   CO-MORBIDITIES: may have co-morbidities  that affects occupational performance. Patient will benefit from skilled OT to address above impairments and improve overall function.  MODIFICATION OR ASSISTANCE TO COMPLETE EVALUATION: Min-Moderate modification of tasks or assist with assess necessary to complete an evaluation.  OT OCCUPATIONAL PROFILE AND HISTORY: Detailed assessment: Review of records and additional review of physical, cognitive, psychosocial history related to current functional performance.  CLINICAL DECISION MAKING: Moderate - several treatment options, min-mod task modification necessary  REHAB POTENTIAL: Good  EVALUATION COMPLEXITY: Moderate    PLAN:  OT FREQUENCY: 2x/week plus eval  OT DURATION: 12 weeks  PLANNED INTERVENTIONS: 97168 OT Re-evaluation, 97535 self care/ADL training, 02889 therapeutic exercise, 97530 therapeutic activity, 97112 neuromuscular re-education, 97140 manual therapy, 97113 aquatic therapy, 97035 ultrasound, 97018 paraffin, 02960 fluidotherapy, 97010 moist heat, 97010 cryotherapy, 97034 contrast bath, 97129 Cognitive training  (first 15 min), 02869 Cognitive training(each additional 15 min), 02249 Physical Performance Testing, 02239 Orthotic Initial, 97763 Orthotic/Prosthetic subsequent, passive range of motion, balance training, functional mobility training, visual/perceptual remediation/compensation, psychosocial skills training, energy conservation, coping strategies training, patient/family education, and DME and/or AE instructions  RECOMMENDED OTHER SERVICES: n/a  CONSULTED AND AGREED WITH PLAN OF CARE: Patient  PLAN FOR NEXT SESSION: initial HEP   Ravin Denardo, OT 02/06/2024, 10:39 AM           "

## 2024-02-06 NOTE — Patient Instructions (Signed)
 [name] will need help with these exercises  ELBOW: Extension / Chest Press (Frame)    Lie on back with knees bent. Straighten elbows to raise cane. Hold _3__ seconds.  __10_ reps per set, __1-2_ sets per day, _5__ days per week   Copyright  VHI. All rights reserved.  Shoulder: Flexion (Supine)    With hands shoulder width apart, slowly lower dowel to floor over head but not all the way back, focus on slow controlled movements  Do not let elbows bend. Keep back flat. Hold _3___ seconds. Repeat _10___ times. Do __1-2__ sessions per day. CAUTION: Stretch slowly and gently.  Copyright  VHI. All rights reserved.

## 2024-02-11 ENCOUNTER — Ambulatory Visit: Admitting: Occupational Therapy

## 2024-02-11 ENCOUNTER — Ambulatory Visit: Admitting: Physical Therapy

## 2024-02-13 ENCOUNTER — Ambulatory Visit: Admitting: Physical Therapy

## 2024-02-13 ENCOUNTER — Ambulatory Visit: Admitting: Occupational Therapy

## 2024-02-23 ENCOUNTER — Encounter

## 2024-02-25 ENCOUNTER — Ambulatory Visit

## 2024-02-27 ENCOUNTER — Encounter

## 2024-03-25 ENCOUNTER — Encounter

## 2024-03-27 ENCOUNTER — Ambulatory Visit

## 2024-03-30 ENCOUNTER — Encounter

## 2024-04-25 ENCOUNTER — Encounter

## 2024-04-27 ENCOUNTER — Ambulatory Visit

## 2024-04-30 ENCOUNTER — Encounter

## 2024-05-28 ENCOUNTER — Ambulatory Visit

## 2024-10-12 ENCOUNTER — Ambulatory Visit
